# Patient Record
Sex: Female | Born: 1955
Health system: Southern US, Community
[De-identification: ages and names within clinical notes are randomized; demographics above are authoritative.]

## PROBLEM LIST (undated history)

## (undated) DIAGNOSIS — E079 Disorder of thyroid, unspecified: Secondary | ICD-10-CM

## (undated) DIAGNOSIS — Z87442 Personal history of urinary calculi: Secondary | ICD-10-CM

## (undated) DIAGNOSIS — F419 Anxiety disorder, unspecified: Secondary | ICD-10-CM

## (undated) DIAGNOSIS — I219 Acute myocardial infarction, unspecified: Secondary | ICD-10-CM

## (undated) DIAGNOSIS — F319 Bipolar disorder, unspecified: Secondary | ICD-10-CM

## (undated) DIAGNOSIS — E039 Hypothyroidism, unspecified: Secondary | ICD-10-CM

## (undated) DIAGNOSIS — G4730 Sleep apnea, unspecified: Secondary | ICD-10-CM

## (undated) DIAGNOSIS — I480 Paroxysmal atrial fibrillation: Secondary | ICD-10-CM

## (undated) DIAGNOSIS — Z8489 Family history of other specified conditions: Secondary | ICD-10-CM

## (undated) DIAGNOSIS — C3490 Malignant neoplasm of unspecified part of unspecified bronchus or lung: Secondary | ICD-10-CM

## (undated) DIAGNOSIS — R0600 Dyspnea, unspecified: Secondary | ICD-10-CM

## (undated) DIAGNOSIS — F4310 Post-traumatic stress disorder, unspecified: Secondary | ICD-10-CM

## (undated) DIAGNOSIS — I209 Angina pectoris, unspecified: Secondary | ICD-10-CM

## (undated) DIAGNOSIS — C801 Malignant (primary) neoplasm, unspecified: Secondary | ICD-10-CM

## (undated) DIAGNOSIS — J449 Chronic obstructive pulmonary disease, unspecified: Secondary | ICD-10-CM

## (undated) HISTORY — PX: EYE SURGERY: SHX253

## (undated) HISTORY — PX: BACK SURGERY: SHX140

## (undated) HISTORY — PX: ABDOMINAL HYSTERECTOMY: SHX81

## (undated) HISTORY — PX: OTHER SURGICAL HISTORY: SHX169

---

## 1998-04-25 ENCOUNTER — Emergency Department (HOSPITAL_COMMUNITY): Admission: EM | Admit: 1998-04-25 | Discharge: 1998-04-25 | Payer: Self-pay | Admitting: Emergency Medicine

## 1999-05-29 ENCOUNTER — Emergency Department (HOSPITAL_COMMUNITY): Admission: EM | Admit: 1999-05-29 | Discharge: 1999-05-29 | Payer: Self-pay | Admitting: Emergency Medicine

## 1999-05-29 ENCOUNTER — Encounter: Payer: Self-pay | Admitting: Emergency Medicine

## 1999-12-11 ENCOUNTER — Ambulatory Visit (HOSPITAL_BASED_OUTPATIENT_CLINIC_OR_DEPARTMENT_OTHER): Admission: RE | Admit: 1999-12-11 | Discharge: 1999-12-11 | Payer: Self-pay | Admitting: Orthopedic Surgery

## 2000-10-01 ENCOUNTER — Ambulatory Visit (HOSPITAL_BASED_OUTPATIENT_CLINIC_OR_DEPARTMENT_OTHER): Admission: RE | Admit: 2000-10-01 | Discharge: 2000-10-01 | Payer: Self-pay | Admitting: Orthopedic Surgery

## 2001-08-04 ENCOUNTER — Encounter: Payer: Self-pay | Admitting: Emergency Medicine

## 2001-08-04 ENCOUNTER — Emergency Department (HOSPITAL_COMMUNITY): Admission: EM | Admit: 2001-08-04 | Discharge: 2001-08-04 | Payer: Self-pay | Admitting: Emergency Medicine

## 2003-08-07 ENCOUNTER — Emergency Department (HOSPITAL_COMMUNITY): Admission: AD | Admit: 2003-08-07 | Discharge: 2003-08-07 | Payer: Self-pay | Admitting: Family Medicine

## 2004-01-28 ENCOUNTER — Emergency Department (HOSPITAL_COMMUNITY): Admission: EM | Admit: 2004-01-28 | Discharge: 2004-01-28 | Payer: Self-pay | Admitting: *Deleted

## 2004-02-27 ENCOUNTER — Encounter: Admission: RE | Admit: 2004-02-27 | Discharge: 2004-02-27 | Payer: Self-pay | Admitting: Sports Medicine

## 2004-02-29 ENCOUNTER — Encounter: Admission: RE | Admit: 2004-02-29 | Discharge: 2004-02-29 | Payer: Self-pay | Admitting: Sports Medicine

## 2004-05-15 ENCOUNTER — Ambulatory Visit (HOSPITAL_BASED_OUTPATIENT_CLINIC_OR_DEPARTMENT_OTHER): Admission: RE | Admit: 2004-05-15 | Discharge: 2004-05-15 | Payer: Self-pay | Admitting: Orthopaedic Surgery

## 2005-01-09 ENCOUNTER — Encounter: Admission: RE | Admit: 2005-01-09 | Discharge: 2005-01-09 | Payer: Self-pay | Admitting: Family Medicine

## 2006-11-11 ENCOUNTER — Encounter: Admission: RE | Admit: 2006-11-11 | Discharge: 2006-11-11 | Payer: Self-pay | Admitting: Neurology

## 2006-11-25 ENCOUNTER — Encounter: Admission: RE | Admit: 2006-11-25 | Discharge: 2006-11-25 | Payer: Self-pay | Admitting: Neurology

## 2007-05-23 ENCOUNTER — Emergency Department (HOSPITAL_COMMUNITY): Admission: EM | Admit: 2007-05-23 | Discharge: 2007-05-23 | Payer: Self-pay | Admitting: Emergency Medicine

## 2008-03-19 ENCOUNTER — Emergency Department (HOSPITAL_COMMUNITY): Admission: EM | Admit: 2008-03-19 | Discharge: 2008-03-19 | Payer: Self-pay | Admitting: Family Medicine

## 2008-11-08 ENCOUNTER — Ambulatory Visit (HOSPITAL_BASED_OUTPATIENT_CLINIC_OR_DEPARTMENT_OTHER): Admission: RE | Admit: 2008-11-08 | Discharge: 2008-11-08 | Payer: Self-pay | Admitting: Orthopaedic Surgery

## 2009-07-02 ENCOUNTER — Emergency Department (HOSPITAL_COMMUNITY): Admission: EM | Admit: 2009-07-02 | Discharge: 2009-07-02 | Payer: Self-pay | Admitting: Emergency Medicine

## 2009-08-08 ENCOUNTER — Ambulatory Visit (HOSPITAL_COMMUNITY): Admission: RE | Admit: 2009-08-08 | Discharge: 2009-08-09 | Payer: Self-pay | Admitting: Neurosurgery

## 2009-11-06 ENCOUNTER — Emergency Department (HOSPITAL_COMMUNITY): Admission: EM | Admit: 2009-11-06 | Discharge: 2009-11-06 | Payer: Self-pay | Admitting: Emergency Medicine

## 2010-11-28 LAB — LIPASE, BLOOD: Lipase: 22 U/L (ref 11–59)

## 2010-11-28 LAB — COMPREHENSIVE METABOLIC PANEL
ALT: 21 U/L (ref 0–35)
Albumin: 4.2 g/dL (ref 3.5–5.2)
Alkaline Phosphatase: 82 U/L (ref 39–117)
BUN: 7 mg/dL (ref 6–23)
CO2: 26 mEq/L (ref 19–32)
Chloride: 104 mEq/L (ref 96–112)
Total Bilirubin: 0.5 mg/dL (ref 0.3–1.2)

## 2010-11-28 LAB — CBC
HCT: 44.3 % (ref 36.0–46.0)
MCHC: 34 g/dL (ref 30.0–36.0)
MCV: 93.7 fL (ref 78.0–100.0)
Platelets: 314 10*3/uL (ref 150–400)
RBC: 4.73 MIL/uL (ref 3.87–5.11)
RDW: 12.8 % (ref 11.5–15.5)

## 2010-11-28 LAB — URINALYSIS, ROUTINE W REFLEX MICROSCOPIC
Bilirubin Urine: NEGATIVE
Glucose, UA: NEGATIVE mg/dL
Hgb urine dipstick: NEGATIVE
Ketones, ur: NEGATIVE mg/dL
Nitrite: NEGATIVE
Protein, ur: NEGATIVE mg/dL
Specific Gravity, Urine: 1.011 (ref 1.005–1.030)
Urobilinogen, UA: 0.2 mg/dL (ref 0.0–1.0)
pH: 6 (ref 5.0–8.0)

## 2010-11-28 LAB — DIFFERENTIAL
Eosinophils Relative: 1 % (ref 0–5)
Lymphs Abs: 3.7 10*3/uL (ref 0.7–4.0)

## 2010-12-12 LAB — COMPREHENSIVE METABOLIC PANEL
Albumin: 4.5 g/dL (ref 3.5–5.2)
CO2: 28 mEq/L (ref 19–32)
Creatinine, Ser: 0.62 mg/dL (ref 0.4–1.2)
GFR calc non Af Amer: >60 mL/min (ref >60)
Potassium: 3.9 mEq/L (ref 3.5–5.1)
Total Protein: 7.6 g/dL (ref 6.0–8.3)

## 2010-12-12 LAB — CBC
HCT: 43 % (ref 36.0–46.0)
MCV: 94.5 fL (ref 78.0–100.0)
Platelets: 214 10*3/uL (ref 150–400)
RBC: 4.55 MIL/uL (ref 3.87–5.11)

## 2011-01-22 NOTE — Op Note (Signed)
Tracy Park, Tracy Park              ACCOUNT NO.:  [id number]   MEDICAL RECORD NO.:  [id number]          PATIENT TYPE:  AMB   LOCATION:  DSC                          FACILITY:  [facility]   PHYSICIAN:  Lubertha Basque. Dalldorf, M.D.DATE OF BIRTH:  1956-03-21   DATE OF PROCEDURE:  11/08/2008  DATE OF DISCHARGE:                               OPERATIVE REPORT   PREOPERATIVE DIAGNOSIS:  Right shoulder biceps tendinopathy.   POSTOPERATIVE DIAGNOSIS:  Right shoulder biceps tendinopathy.   PROCEDURE:  1. Right shoulder arthroscopic debridement.  2. Right shoulder open biceps tenodesis.   ANESTHESIA:  General.   ATTENDING SURGEON:  Lubertha Basque. Jerl Santos, MD   ASSISTANT:  Lindwood Qua, PA   INDICATIONS FOR PROCEDURE:  The patient is a 55 year old woman many  years out from a shoulder injury.  She has had two arthroscopies of her  shoulder most recently 2 years back.  She persists with anterior pain in  the shoulder which has responded only to injections in the biceps tendon  sheath.  She has failed therapy and oral anti-inflammatories.  MRI scan  of the rotator cuff appears to be intact and the biceps itself also  appears relatively benign.  Unfortunately, she persists with her  discomfort and at this point is offered a third operation on her  shoulder.  She has been to a neck specialist who does not feel that her  right arm pain is related to a disk problem which she also has at the  cervical spine.  Informed operative consent was obtained after  discussion of possible complications including reaction to anesthesia  and infection and neurovascular injury.   SUMMARY OF FINDINGS AND PROCEDURE:  Under general anesthesia an  arthroscopy of the right shoulder was performed.  Glenohumeral joint  showed no degenerative changes and the rotator cuff did have some  partial-thickness tearing seen on this articular surface.  A debridement  was done but the tear did not consist of more than 10% of thickness  of  the tendon.  The labral structures all appeared intact.  The biceps  tendon appeared benign.  When I pulled this down into the shoulder,  there was a small longitudinal split with some erythema at the exit of  the shoulder.  We subsequently performed a biceps tenodesis in an open  fashion.  [name] assisted throughout and was invaluable to the  completion of the case that he helped retract and position while I  performed the procedure.   DESCRIPTION OF PROCEDURE:  The patient was taken to the operating suite  where general anesthetic was applied without difficulty.  She was  positioned in beach-chair position and prepped and draped in normal  sterile fashion.  After administration of IV Kefzol, an arthroscopy of  the right shoulder was performed through a total of two portals.  Findings were as noted above and procedure consisted of the arthroscopic  debridement of the undersurface rotator cuff tear followed by a biceps  tenodesis.  We cut the tendon at the superior labrum with a basket and  then smoothed this off with the ArthroCare wand.  I then made a small  anterolateral incision with dissection down through longitudinal split  in the deltoid to the bicipital groove.  We found the tendon here and  brought out into the wound.  I created a bleeding bed of bone with an  arthroscopic bur and then sutured the tendon in this area with Vicryl  suture.  The shoulder was irrigated followed by reapproximation of the  deltoid split with Vicryl and subcutaneous tissues with 2-0 undyed  Vicryl.  Skin was closed with nylon.  Marcaine was injected followed by  Adaptic and dry gauze dressing with tape.  The arthroscopic portals were  closed loosely with nylon.  These portals were dressed in a similar  fashion.  Estimated blood loss and intraoperative fluids obtained from  anesthesia records.   DISPOSITION:  The patient was extubated in the operating room and taken  to recovery room in  stable addition.  She was to go home the same day  and follow up in the office in less than a week.  I will contact her by  phone tonight.      Lubertha Basque Jerl Santos, M.D.  Electronically Signed     PGD/MEDQ  D:  11/08/2008  T:  11/09/2008  Job:  [id number]

## 2011-01-25 NOTE — Op Note (Signed)
[place]. [facility]  Patient:    Tracy Park, Tracy Park                        MRN: [id number] Proc. Date: 10/01/00 Attending:  Nicki Reaper, M.D.                           Operative Report  PREOPERATIVE DIAGNOSIS:  Stenosing tenosynovitis, left index finger.  POSTOPERATIVE DIAGNOSIS:  Stenosing tenosynovitis, left index finger.  OPERATION PERFORMED:  Release A-1 pulley left index finger.  SURGEON:  Nicki Reaper, M.D.  ASSISTANTCarolyne Fiscal, RN.  ANESTHESIA:  Forearm based IV regional.  ANESTHESIOLOGIST:  Dr.Singer.  INDICATIONS FOR PROCEDURE:  The patient is a 55 year old female with a history of triggering of her left index finger.  She has undergone reconstruction of the collateral ligaments and is seen now for release of the A1 pulley.  DESCRIPTION OF PROCEDURE:  The patient was brought to the operating room where a forearm based IV regional anesthetic was carried out without difficulty. She was prepped and draped using Betadine scrub and solution.  An oblique incision was made over the A-1 pulley of the left index finger and carried own through subcutaneous tissues.  Bleeders were electrocauterized.  A moderate amount of scar was present in the radial digital nerve.  A neurolysis was performed releasing this area.  The A-1 pulley was then released on its radial aspect.  A small incision made in the A-2 pulley.  Moderate scarring was present around the tendons.  The wound was copiously irrigated with saline and the finger placed through its range of motion and no further triggering was identified.  The skin was closed with interrupted 5-0 nylon sutures.  Sterile compressive dressing and splint was applied.  The patient tolerated the procedure well and was discharged home to return to the [facility] [place] in one week on Vicodin and Keflex. DD:  10/01/00 TD:  10/01/00 Job: [id number]

## 2011-01-25 NOTE — Op Note (Signed)
NAME:  Tracy Park, Tracy Park                        ACCOUNT NO.:  [id number]   MEDICAL RECORD NO.:  [id number]                   PATIENT TYPE:  AMB   LOCATION:  DSC                                  FACILITY:  [facility]   PHYSICIAN:  Lubertha Basque. Jerl Santos, M.D.             DATE OF BIRTH:  [birth year]   DATE OF PROCEDURE:  05/15/2004  DATE OF DISCHARGE:                                 OPERATIVE REPORT   PREOPERATIVE DIAGNOSES:  1.  Right shoulder impingement.  2.  Right shoulder AC degeneration.  3.  Right shoulder partial rotator cuff tear.   POSTOPERATIVE DIAGNOSES:  1.  Right shoulder impingement.  2.  Right shoulder AC degeneration.  3.  Right shoulder partial rotator cuff tear.   PROCEDURE:  1.  Right shoulder arthroscopic acromioplasty.  2.  Right shoulder arthroscopic acromioclavicular joint resection.  3.  Right shoulder arthroscopic debridement.   SURGEON:  Lubertha Basque. Jerl Santos, M.D.   ASSISTANT:  Lindwood Qua, P.A.   ANESTHESIA:  General.   INDICATIONS FOR PROCEDURE:  The patient is a 55 year old woman with a long  history of a work-related shoulder problem.  This has persisted despite oral  anti-inflammatories, therapy and an injection.  She has undergone an MRI  scan which showed findings consistent with impingement, due to her  subacromial morphology and her AC joint. She has pain at rest and pain with  activities, and is offered an arthroscopy.  Informed operative consent was  obtained after discussion of the possible complications of, reaction to  anesthesia and infection.   DESCRIPTION OF PROCEDURE:  The patient was taken to the operating suite  where general anesthesia was applied without difficulty.  She was positioned  in the beach chair position, prepped and draped in normal sterile fashion.  After the administration of preoperative IV antibiotics, arthroscopy of the  right shoulder was performed through a total of 3 portals.   Glenohumeral joint showed no  degenerative change.  The biceps tendon and  rotator cuff appeared completely benign from below.  In the subacromial  space, she had some bursitis addressed with a debridement. She had a partial  thickness rotator cuff tear addressed with a debridement. She had an  unfavorable, subacromial morphology addressed with an acromioplasty back to  a flat surface. This was done with the bur in the lateral position, followed  by transfer of the bur to the posterior position.  She also had bone on bone  contact at the [facility] joint and a spur below the distal clavicle, which also  appeared to impinge on the rotator cuff.  A formal ACD compression was then  done through the additional anterior portal.   The shoulder was thoroughly irrigated, followed by reapproximation of the  portals loosely with nylon.  Marcaine with epinephrine and morphine were  injected into the shoulder joint, followed by Adaptic with a dry gauze  dressing and tape.  Estimated blood loss  and intraoperative fluids can be  obtained from anesthesia records.   DISPOSITION:  The patient was extubated in the operating room, taken to the  recovery room stable.  Plans were for her to go home the same day and to  follow-up in the office in less than a week.  I will contact her by phone  tonight.                                               Lubertha Basque Jerl Santos, M.D.    PGD/MEDQ  D:  05/15/2004  T:  05/15/2004  Job:  [id number]

## 2011-01-25 NOTE — Op Note (Signed)
[place]. [facility]  Patient:    Tracy Park, Tracy Park                        MRN: [id number] Proc. Date: 12/11/99 Attending:  Nicki Reaper, M.D. CC:         Nicki Reaper, M.D. (2)                           Operative Report  PREOPERATIVE DIAGNOSIS:  Ruptured collateral ligament, metacarpal phalangeal joint, left index finger.  POSTOPERATIVE DIAGNOSIS:  Ruptured collateral ligament, metacarpal phalangeal joint, left index finger.  OPERATION:  Repair of radial and ulnar collateral ligaments, metacarpal phalangeal joint, left middle finger.  SURGEON:  Nicki Reaper, M.D.  ASSISTANT:  R.N.  ANESTHESIA:  Axillary block.  ANESTHESIOLOGIST:  Judie Petit, M.D.  INDICATIONS:  The patient is a 55 year old female with an injury to the left index finger.  She has a fracture fragment on the proximal phalanx of the index finger, radial side.  An MRI reveals a rupture of the ulnar collateral ligament off the  metacarpal head.  DESCRIPTION OF PROCEDURE:  The patient was brought to the operating room where n axillary block was carried out without difficulty.  She was prepped and draped using Betadine scrub and solution, with the left arm free, in the supine position. A curved incision was made over the metacarpal and carried down through the subcutaneous tissues.  The bleeders were electrocauterized.  The incision was made through the extensor retinaculum on the sagittal fibers on the ulnar side, allowing the extensor tendon to be pulled radially.  The joint capsule was opened with sharp dissection.  The collateral ligaments were then inspected.  A defect was present at the attachment of the ulnar collateral ligament off of the metacarpal head on the ulnar side.  Detachment was present on the radial side at the proximal phalanx.  Drill holes were placed for the placement of a suture through the metacarpal head, and through the base of the proximal  phalanx in the position of the old insertion of the collateral ligament.  The modified Kessler #3-0 Tycron sutures were passed through the drill holes, after using a Bunnell-type suture.  The joint was pinned with a 3.5 K-wire in 70 degrees of flexion, neutral position, and the repairs were performed to both radial and ulnar collateral ligaments.  The sutures were buried beneath the extensor tendon.  The wound was irrigated.  The capsule was closed ith a running #5-0 chromic suture.  The extensor retinaculum sagittal fibers closed  with running #4-0 Mersilene sutures.  The skin was closed with interrupted #5-0  nylon sutures.  A sterile compressive dressing and splint were applied.  The patient tolerated the procedure well and was taken to the recovery room for  observation, in satisfactory condition.  DISPOSITION:  She is discharged home, to return to the [facility] in one week, on Vicodin and Keflex. DD:  12/11/99 TD:  12/11/99 Job: [id number]

## 2011-05-16 ENCOUNTER — Other Ambulatory Visit: Payer: Self-pay | Admitting: Obstetrics and Gynecology

## 2011-05-16 DIAGNOSIS — Z1231 Encounter for screening mammogram for malignant neoplasm of breast: Secondary | ICD-10-CM

## 2012-04-27 ENCOUNTER — Encounter (HOSPITAL_COMMUNITY): Payer: Self-pay | Admitting: *Deleted

## 2012-04-27 ENCOUNTER — Emergency Department (HOSPITAL_COMMUNITY): Payer: Self-pay

## 2012-04-27 ENCOUNTER — Observation Stay (HOSPITAL_COMMUNITY)
Admission: EM | Admit: 2012-04-27 | Discharge: 2012-04-28 | Disposition: A | Discharge status: Home / Self Care | Payer: Self-pay | Attending: Internal Medicine | Admitting: Internal Medicine

## 2012-04-27 DIAGNOSIS — F3289 Other specified depressive episodes: Secondary | ICD-10-CM | POA: Insufficient documentation

## 2012-04-27 DIAGNOSIS — R079 Chest pain, unspecified: Secondary | ICD-10-CM

## 2012-04-27 DIAGNOSIS — Z8249 Family history of ischemic heart disease and other diseases of the circulatory system: Secondary | ICD-10-CM | POA: Insufficient documentation

## 2012-04-27 DIAGNOSIS — E890 Postprocedural hypothyroidism: Secondary | ICD-10-CM | POA: Insufficient documentation

## 2012-04-27 DIAGNOSIS — K219 Gastro-esophageal reflux disease without esophagitis: Secondary | ICD-10-CM

## 2012-04-27 DIAGNOSIS — Z720 Tobacco use: Secondary | ICD-10-CM

## 2012-04-27 DIAGNOSIS — F17200 Nicotine dependence, unspecified, uncomplicated: Secondary | ICD-10-CM | POA: Insufficient documentation

## 2012-04-27 DIAGNOSIS — F329 Major depressive disorder, single episode, unspecified: Secondary | ICD-10-CM | POA: Diagnosis present

## 2012-04-27 DIAGNOSIS — I099 Rheumatic heart disease, unspecified: Secondary | ICD-10-CM | POA: Insufficient documentation

## 2012-04-27 DIAGNOSIS — R0789 Other chest pain: Principal | ICD-10-CM

## 2012-04-27 DIAGNOSIS — F32A Depression, unspecified: Secondary | ICD-10-CM

## 2012-04-27 DIAGNOSIS — E039 Hypothyroidism, unspecified: Secondary | ICD-10-CM

## 2012-04-27 HISTORY — DX: Disorder of thyroid, unspecified: E07.9

## 2012-04-27 MED ORDER — ONDANSETRON HCL 4 MG/2ML IJ SOLN
4.0000 mg | Freq: Once | INTRAMUSCULAR | Status: AC
  Administered 2012-04-27: 4 mg via INTRAVENOUS
  Filled 2012-04-27: qty 2

## 2012-04-27 MED ORDER — MORPHINE SULFATE 4 MG/ML IJ SOLN
4.0000 mg | Freq: Once | INTRAMUSCULAR | Status: AC
  Administered 2012-04-27: 4 mg via INTRAVENOUS
  Filled 2012-04-27: qty 1

## 2012-04-27 NOTE — ED Notes (Signed)
Pt in via EMS- per EMS, pt in c/o chest pain intermittent chest pain since thyroidectomy 7/17, states pain has been controllable with rest since that time, this evening pain started and was more severe, rated pain 10/10 to central chest with no radiation, pt denies other symptoms with this. Pt currently rating pain 4/10. Pt was given nitro SL x2 PTA, pt is allergic to ASA. IV initiated PTA 20g in LAC.

## 2012-04-27 NOTE — ED Provider Notes (Signed)
History     CSN: [id number]  Arrival date & time 04/27/12  2322   First MD Initiated Contact with Patient 04/27/12 2330      Chief Complaint  Patient presents with  . Chest Pain    (Consider location/radiation/quality/duration/timing/severity/associated sxs/prior treatment) HPI Hx per PT. At home tonight, at rest, developed substernal chest pressure that radiated to R side around to her back. No SOB, nausea or diaphoresis. Has h/o reflux but never symptoms like this. No leg pain or swelling, did have thyroid removed 03-25-12, followed by PCP in [facility] [address] [place]. Dec pain with NTG in route still has pain. 4/10. No h/o same. No h/o CAD   No past medical history on file.  No past surgical history on file.  No family history on file.  History  Substance Use Topics  . Smoking status: Not on file  . Smokeless tobacco: Not on file  . Alcohol Use: Not on file    OB History    No data available      Review of Systems  Constitutional: Negative for fever and chills.  HENT: Negative for neck pain and neck stiffness.   Eyes: Negative for pain.  Respiratory: Negative for shortness of breath.   Cardiovascular: Positive for chest pain.  Gastrointestinal: Negative for abdominal pain.  Genitourinary: Negative for dysuria.  Musculoskeletal: Negative for back pain.  Skin: Negative for rash.  Neurological: Negative for headaches.  All other systems reviewed and are negative.    Allergies  Review of patient's allergies indicates not on file.  Home Medications  No current outpatient prescriptions on file.  There were no vitals taken for this visit.  Physical Exam  Constitutional: She is oriented to person, place, and time. She appears well-developed and well-nourished.  HENT:  Head: Normocephalic and atraumatic.  Eyes: Conjunctivae and EOM are normal. Pupils are equal, round, and reactive to light.  Neck: Trachea normal. Neck supple. No tracheal deviation present.   Healing anterior thyroidectomy scar  Cardiovascular: Normal rate, regular rhythm, S1 normal, S2 normal and normal pulses.     No systolic murmur is present   No diastolic murmur is present  Pulses:      Radial pulses are 2+ on the right side, and 2+ on the left side.  Pulmonary/Chest: Effort normal and breath sounds normal. No stridor. She has no wheezes. She has no rhonchi. She has no rales. She exhibits no tenderness.  Abdominal: Soft. Normal appearance and bowel sounds are normal. There is no tenderness. There is no CVA tenderness and negative Murphy's sign.  Musculoskeletal:       BLE:s Calves nontender, no cords or erythema, negative Homans sign  Neurological: She is alert and oriented to person, place, and time. She has normal strength. No cranial nerve deficit or sensory deficit. GCS eye subscore is 4. GCS verbal subscore is 5. GCS motor subscore is 6.  Skin: Skin is warm and dry. No rash noted. She is not diaphoretic.  Psychiatric: Her speech is normal.       Cooperative and appropriate    ED Course  Procedures (including critical care time)  Results for orders placed during the hospital encounter of 04/27/12  CBC      Component Value Range   WBC 12.7 (*) 4.0 - 10.5 K/uL   RBC 4.48  3.87 - 5.11 MIL/uL   Hemoglobin 13.9  12.0 - 15.0 g/dL   HCT 08.6  57.8 - 46.9 %   MCV 92.2  78.0 -  100.0 fL   MCH 31.0  26.0 - 34.0 pg   MCHC 33.7  30.0 - 36.0 g/dL   RDW 16.1  09.6 - 04.5 %   Platelets 218  150 - 400 K/uL  D-DIMER, QUANTITATIVE      Component Value Range   D-Dimer, Quant 0.83 (*) 0.00 - 0.48 ug/mL-FEU  POCT I-STAT, CHEM 8      Component Value Range   Sodium 138  135 - 145 mEq/L   Potassium 3.9  3.5 - 5.1 mEq/L   Chloride 104  96 - 112 mEq/L   BUN 14  6 - 23 mg/dL   Creatinine, Ser 4.09  0.50 - 1.10 mg/dL   Glucose, Bld 811 (*) 70 - 99 mg/dL   Calcium, Ion 9.14  7.82 - 1.23 mmol/L   TCO2 24  0 - 100 mmol/L   Hemoglobin 14.6  12.0 - 15.0 g/dL   HCT 95.6  21.3 - 08.6 %   POCT I-STAT TROPONIN I      Component Value Range   Troponin i, poc 0.00  0.00 - 0.08 ng/mL   Comment 3            Ct Angio Chest Pe W/cm &/or Wo Cm  04/28/2012  *RADIOLOGY REPORT*  Clinical Data: Intermittent chest pain since thyroidectomy 07/17.  CT ANGIOGRAPHY CHEST  Technique:  Multidetector CT imaging of the chest using the standard protocol during bolus administration of intravenous contrast. Multiplanar reconstructed images including MIPs were obtained and reviewed to evaluate the vascular anatomy.  Contrast: OMNIPAQUE IOHEXOL 350 MG/ML SOLN  Comparison: None.  Findings: Technically adequate study with good opacification of the central and segmental pulmonary arteries.  No focal filling defects demonstrated.  No evidence of significant pulmonary embolus.  Normal heart size.  Normal caliber thoracic aorta.  No aortic dissection.  Coronary artery calcifications.  No significant lymphadenopathy in the chest.  The esophagus is decompressed.  No pleural effusions.  Emphysematous changes and scattered fibrosis in the lungs.  No focal airspace consolidation.  No significant interstitial disease.  Airways appear patent.  No pneumothorax.  IMPRESSION: No evidence of significant pulmonary embolus.  Emphysematous changes and fibrosis in the lungs.   Original Report Authenticated By: Marlon Pel, M.D.    Dg Chest Portable 1 View  04/28/2012  *RADIOLOGY REPORT*  Clinical Data: Chest pain, short of breath  PORTABLE CHEST - 1 VIEW  Comparison: 01/09/2005  Findings: Heart size and vascularity are normal.  Lungs are clear without infiltrate or effusion.  Negative for heart failure.  IMPRESSION: No acute abnormality.   Original Report Authenticated By: Camelia Phenes, M.D.      Date: 04/27/2012  Rate: 83  Rhythm: normal sinus rhythm  QRS Axis: normal  Intervals: normal  ST/T Wave abnormalities: nonspecific ST changes  Conduction Disutrbances:none  Narrative Interpretation:   Old EKG Reviewed:  unchanged  ASA and NTG PTA.   IV morphine.   7:11 AM d/w triad hospitalist on call Dr Isidoro Donning, will admit tele team 9 MDM   CP evaluated with labs, ECG and CXR as above. IV narcotics. Plan MED admit.         Sunnie Nielsen, MD 04/28/12 [phone]

## 2012-04-28 ENCOUNTER — Encounter (HOSPITAL_COMMUNITY): Payer: Self-pay | Admitting: Radiology

## 2012-04-28 ENCOUNTER — Observation Stay (HOSPITAL_COMMUNITY): Payer: Self-pay

## 2012-04-28 ENCOUNTER — Emergency Department (HOSPITAL_COMMUNITY): Payer: Self-pay

## 2012-04-28 DIAGNOSIS — K219 Gastro-esophageal reflux disease without esophagitis: Secondary | ICD-10-CM | POA: Diagnosis present

## 2012-04-28 DIAGNOSIS — F17200 Nicotine dependence, unspecified, uncomplicated: Secondary | ICD-10-CM

## 2012-04-28 DIAGNOSIS — Z720 Tobacco use: Secondary | ICD-10-CM | POA: Diagnosis present

## 2012-04-28 DIAGNOSIS — E039 Hypothyroidism, unspecified: Secondary | ICD-10-CM | POA: Diagnosis present

## 2012-04-28 DIAGNOSIS — R0789 Other chest pain: Secondary | ICD-10-CM | POA: Diagnosis present

## 2012-04-28 DIAGNOSIS — F32A Depression, unspecified: Secondary | ICD-10-CM | POA: Diagnosis present

## 2012-04-28 DIAGNOSIS — F329 Major depressive disorder, single episode, unspecified: Secondary | ICD-10-CM

## 2012-04-28 DIAGNOSIS — R079 Chest pain, unspecified: Secondary | ICD-10-CM

## 2012-04-28 LAB — CBC
HCT: 41.2 % (ref 36.0–46.0)
HCT: 41.3 % (ref 36.0–46.0)
HCT: 41.3 % (ref 36.0–46.0)
Hemoglobin: 13.9 g/dL (ref 12.0–15.0)
Hemoglobin: 13.6 g/dL (ref 12.0–15.0)
MCH: 30.6 pg (ref 26.0–34.0)
MCH: 31 pg (ref 26.0–34.0)
MCH: 31 pg (ref 26.0–34.0)
MCHC: 33 g/dL (ref 30.0–36.0)
MCHC: 33.7 g/dL (ref 30.0–36.0)
MCHC: 33.7 g/dL (ref 30.0–36.0)
MCV: 92.2 fL (ref 78.0–100.0)
Platelets: 218 10*3/uL (ref 150–400)
RBC: 4.45 MIL/uL (ref 3.87–5.11)
RDW: 13.5 % (ref 11.5–15.5)
WBC: 12.7 10*3/uL — ABNORMAL HIGH (ref 4.0–10.5)

## 2012-04-28 LAB — CREATININE, SERUM
Creatinine, Ser: 0.66 mg/dL (ref 0.50–1.10)
GFR calc non Af Amer: >90 mL/min (ref >90)
GFR calc non Af Amer: >90 mL/min (ref >90)

## 2012-04-28 LAB — POCT I-STAT, CHEM 8
Chloride: 104 mEq/L (ref 96–112)
Hemoglobin: 14.6 g/dL (ref 12.0–15.0)
Sodium: 138 mEq/L (ref 135–145)
TCO2: 24 mmol/L (ref 0–100)

## 2012-04-28 LAB — CARDIAC PANEL(CRET KIN+CKTOT+MB+TROPI): Troponin I: <0.3 ng/mL (ref <0.30)

## 2012-04-28 LAB — POCT I-STAT TROPONIN I

## 2012-04-28 LAB — TSH: TSH: 51.704 u[IU]/mL — ABNORMAL HIGH (ref 0.350–4.500)

## 2012-04-28 MED ORDER — REGADENOSON 0.4 MG/5ML IV SOLN
0.4000 mg | Freq: Once | INTRAVENOUS | Status: AC
  Administered 2012-04-28: 0.4 mg via INTRAVENOUS
  Filled 2012-04-28: qty 5

## 2012-04-28 MED ORDER — ALUM & MAG HYDROXIDE-SIMETH 200-200-20 MG/5ML PO SUSP
30.0000 mL | Freq: Four times a day (QID) | ORAL | Status: DC | PRN
Start: 2012-04-28 — End: 2012-04-28

## 2012-04-28 MED ORDER — ACETAMINOPHEN 325 MG PO TABS
ORAL_TABLET | ORAL | Status: AC
  Filled 2012-04-28: qty 2

## 2012-04-28 MED ORDER — TECHNETIUM TC 99M TETROFOSMIN IV KIT
10.0000 | PACK | Freq: Once | INTRAVENOUS | Status: AC | PRN
  Administered 2012-04-28: 10 via INTRAVENOUS

## 2012-04-28 MED ORDER — ONDANSETRON HCL 4 MG PO TABS: 4.0000 mg | ORAL_TABLET | Freq: Four times a day (QID) | ORAL | Status: DC | PRN

## 2012-04-28 MED ORDER — ENOXAPARIN SODIUM 40 MG/0.4ML ~~LOC~~ SOLN
40.0000 mg | SUBCUTANEOUS | Status: DC
  Filled 2012-04-28: qty 0.4

## 2012-04-28 MED ORDER — HYDROMORPHONE HCL PF 1 MG/ML IJ SOLN: 1.0000 mg | INTRAMUSCULAR | Status: DC | PRN

## 2012-04-28 MED ORDER — SERTRALINE HCL 100 MG PO TABS
100.0000 mg | ORAL_TABLET | Freq: Every day | ORAL | Status: DC
  Administered 2012-04-28: 100 mg via ORAL
  Filled 2012-04-28: qty 1

## 2012-04-28 MED ORDER — NICOTINE 21 MG/24HR TD PT24
21.0000 mg | MEDICATED_PATCH | Freq: Every day | TRANSDERMAL | Status: DC
  Administered 2012-04-28: 21 mg via TRANSDERMAL
  Filled 2012-04-28: qty 1

## 2012-04-28 MED ORDER — ONDANSETRON HCL 4 MG/2ML IJ SOLN: 4.0000 mg | Freq: Four times a day (QID) | INTRAMUSCULAR | Status: DC | PRN

## 2012-04-28 MED ORDER — ACETAMINOPHEN 650 MG RE SUPP: 650.0000 mg | Freq: Four times a day (QID) | RECTAL | Status: DC | PRN

## 2012-04-28 MED ORDER — ALBUTEROL SULFATE (5 MG/ML) 0.5% IN NEBU
2.5000 mg | INHALATION_SOLUTION | Freq: Four times a day (QID) | RESPIRATORY_TRACT | Status: DC
  Administered 2012-04-28: 2.5 mg via RESPIRATORY_TRACT
  Filled 2012-04-28: qty 0.5

## 2012-04-28 MED ORDER — IOHEXOL 350 MG/ML SOLN
100.0000 mL | Freq: Once | INTRAVENOUS | Status: AC | PRN
  Administered 2012-04-28: 100 mL via INTRAVENOUS

## 2012-04-28 MED ORDER — PANTOPRAZOLE SODIUM 40 MG PO TBEC
80.0000 mg | DELAYED_RELEASE_TABLET | Freq: Every day | ORAL | Status: DC
  Administered 2012-04-28: 80 mg via ORAL
  Filled 2012-04-28 (×2): qty 1

## 2012-04-28 MED ORDER — REGADENOSON 0.4 MG/5ML IV SOLN
INTRAVENOUS | Status: AC
  Administered 2012-04-28: 0.4 mg via INTRAVENOUS
  Filled 2012-04-28: qty 5

## 2012-04-28 MED ORDER — HYDROCODONE-ACETAMINOPHEN 5-325 MG PO TABS
1.0000 | ORAL_TABLET | ORAL | Status: DC | PRN
  Administered 2012-04-28: 1 via ORAL
  Filled 2012-04-28: qty 1

## 2012-04-28 MED ORDER — SODIUM CHLORIDE 0.9 % IJ SOLN: 3.0000 mL | Freq: Two times a day (BID) | INTRAMUSCULAR | Status: DC

## 2012-04-28 MED ORDER — TECHNETIUM TC 99M TETROFOSMIN IV KIT
30.0000 | PACK | Freq: Once | INTRAVENOUS | Status: AC | PRN
  Administered 2012-04-28: 30 via INTRAVENOUS

## 2012-04-28 MED ORDER — LEVOTHYROXINE SODIUM 112 MCG PO TABS
112.0000 ug | ORAL_TABLET | Freq: Every day | ORAL | Status: DC
  Administered 2012-04-28: 112 ug via ORAL
  Filled 2012-04-28: qty 1

## 2012-04-28 MED ORDER — HEPARIN SODIUM (PORCINE) 5000 UNIT/ML IJ SOLN
5000.0000 [IU] | Freq: Three times a day (TID) | INTRAMUSCULAR | Status: DC
  Administered 2012-04-28: 5000 [IU] via SUBCUTANEOUS
  Filled 2012-04-28 (×4): qty 1

## 2012-04-28 MED ORDER — ALBUTEROL SULFATE (5 MG/ML) 0.5% IN NEBU: 2.5000 mg | INHALATION_SOLUTION | RESPIRATORY_TRACT | Status: DC | PRN

## 2012-04-28 MED ORDER — ACETAMINOPHEN 325 MG PO TABS
650.0000 mg | ORAL_TABLET | Freq: Four times a day (QID) | ORAL | Status: DC | PRN
  Administered 2012-04-28: 650 mg via ORAL

## 2012-04-28 MED ORDER — SODIUM CHLORIDE 0.9 % IV SOLN: INTRAVENOUS | Status: DC

## 2012-04-28 MED ORDER — GUAIFENESIN-DM 100-10 MG/5ML PO SYRP: 5.0000 mL | ORAL_SOLUTION | ORAL | Status: DC | PRN

## 2012-04-28 NOTE — ED Notes (Signed)
Pt to CT scan at this time.

## 2012-04-28 NOTE — Progress Notes (Signed)
UR Completed Tinzlee Craker Graves-Bigelow, RN,BSN [phone]  

## 2012-04-28 NOTE — ED Notes (Signed)
Called report to Christy, RN

## 2012-04-28 NOTE — Progress Notes (Addendum)
Pre-test, no chest pain or SOB.  Lexiscan MV performed without complication.  Tracy Park 04/28/2012 11:28 AM

## 2012-04-28 NOTE — ED Notes (Signed)
Patient comfortable.  Moving to 3W25 with monitor by EMT and RN at side.

## 2012-04-28 NOTE — Progress Notes (Signed)
Notified Dr. Otis Peak negative per report. May d/c patient.

## 2012-04-28 NOTE — ED Notes (Signed)
Pt returned from ct

## 2012-04-28 NOTE — H&P (Signed)
History and Physical       Hospital Admission Note Date: 04/28/2012  Patient name: Tracy Park Medical record number: [id number] Date of birth: 19-Dec-1955 Age: 56 y.o. Gender: female PCP: Dr Sw[place] in [place]    Chief Complaint:  Chest pain  HPI: Patient is a 56 year old female with history of GERD, recent thyroidectomy, family history of coronary artery disease and rheumatic fever/rheumatic heart disease presented to ED for chest pain. Patient stated that she's been having off-and-on chest pain for last one month which spontaneously resolves and no particular association with exertion or rest. However yesterday around 8:30 PM, patient was sitting on her couch and she started having midsternal chest pain. She initially thought it was "acid reflux" but the pain persisted and was midsternal, 10/10 in intensity, radiating to the right side of back and occasionally to the neck. She denied any nausea, vomiting, diaphoresis, palpitations. Patient called the EMS and chest pain resolved after 2 sublingual nitroglycerin. Point of care troponin was negative x2, D. dimer was slightly elevated at 0.83, CTA chest is negative for PE. Given patient has strong family history of cardiac disease, active nicotine abuse, patient will be admitted for chest pain rule out.   Review of Systems:  Constitutional: Denies fever, chills, diaphoresis, appetite change and fatigue.  HEENT: Denies photophobia, eye pain, redness, hearing loss, ear pain, congestion, sore throat, rhinorrhea, sneezing, mouth sores, trouble swallowing, neck pain, neck stiffness and tinnitus.   Respiratory: Denies SOB, DOE. Patient has some slight expiratory wheezing and coughing.  Cardiovascular: Please see history of present illness  Gastrointestinal: Denies nausea, vomiting, abdominal pain, diarrhea, constipation, blood in stool and abdominal distention.  Genitourinary: Denies dysuria,  urgency, frequency, hematuria, flank pain and difficulty urinating.  Musculoskeletal: Denies myalgias, back pain, joint swelling, arthralgias and gait problem.  Skin: Denies pallor, rash and wound.  Neurological: Denies dizziness, seizures, syncope, weakness, light-headedness, numbness and headaches.  Hematological: Denies adenopathy. Easy bruising, personal or family bleeding history  Psychiatric/Behavioral: Denies suicidal ideation, mood changes, confusion, nervousness, sleep disturbance and agitation  Past Medical History: Past Medical History  Diagnosis Date  . Thyroid disease (hypothyroidism acquired status post thyroidectomy) GERD  Depression  Nicotine abuse       Past surgical history. - Recent total thyroidectomy on 03/25/2012 (per patient it was due to nodules and scar tissue but biopsy was negative for any malignancy). - C5-C6 disc surgery - Total hysterectomy with appendectomy  Medications: Prior to Admission medications   Medication Sig Start Date End Date Taking? Authorizing Provider  levothyroxine (SYNTHROID, LEVOTHROID) 112 MCG tablet Take 112 mcg by mouth daily.   Yes Historical Provider, MD  omeprazole (PRILOSEC) 20 MG capsule Take 40 mg by mouth daily.   Yes Historical Provider, MD  sertraline (ZOLOFT) 100 MG tablet Take 100 mg by mouth daily.   Yes Historical Provider, MD    Allergies:   Allergies  Allergen Reactions  . Aspirin Hives  . Tape Rash    Social History: Patient smokes half a pack a day for last 40 years, she is trying to quit. Denies any alcohol or any drug use. She currently lives at home and placed without any assistance.  Family History: Patient has a strong family history of rheumatic fever and rheumatic heart disease and in her mother, maternal grandmother, 5 of her siblings. Mother died of MI at the age of 48.  Physical Exam: Blood pressure 109/69, pulse 69, temperature 97.9 F (36.6 C), temperature source Oral, resp. rate 17,  SpO2  95.00%. General: Alert, awake, oriented x3, in no acute distress. HEENT: normocephalic, atraumatic, anicteric sclera, pink conjunctiva, pupils equal and reactive to light and accomodation, oropharynx clear Neck: supple, no masses or lymphadenopathy, no goiter, no bruits  Heart: Regular rate and rhythm, without murmurs, rubs or gallops. Lungs: Scattered expiratory wheezing. No chest wall tenderness noted Abdomen: Soft, nontender, nondistended, positive bowel sounds, no masses. Extremities: No clubbing, cyanosis or edema with positive pedal pulses. Neuro: Grossly intact, no focal neurological deficits, strength 5/5 upper and lower extremities bilaterally Psych: alert and oriented x 3, normal mood and affect Skin: no rashes or lesions, warm and dry   LABS on Admission:  Basic Metabolic Panel:  Lab 04/28/12 1610  NA 138  K 3.9  CL 104  CO2 --  GLUCOSE 117*  BUN 14  CREATININE 1.00  CALCIUM --  MG --  PHOS --   CBC:  Lab 04/28/12 0733 04/28/12 0026 04/27/12 2349  WBC 9.8 -- 12.7*  NEUTROABS -- -- --  HGB 13.9 14.6 --  HCT 41.3 43.0 --  MCV 92.2 -- --  PLT 206 -- 218     Radiological Exams on Admission: Ct Angio Chest Pe W/cm &/or Wo Cm  04/28/2012  *RADIOLOGY REPORT*  Clinical Data: Intermittent chest pain since thyroidectomy 07/17.  CT ANGIOGRAPHY CHEST  Technique:  Multidetector CT imaging of the chest using the standard protocol during bolus administration of intravenous contrast. Multiplanar reconstructed images including MIPs were obtained and reviewed to evaluate the vascular anatomy.  Contrast: OMNIPAQUE IOHEXOL 350 MG/ML SOLN  Comparison: None.  Findings: Technically adequate study with good opacification of the central and segmental pulmonary arteries.  No focal filling defects demonstrated.  No evidence of significant pulmonary embolus.  Normal heart size.  Normal caliber thoracic aorta.  No aortic dissection.  Coronary artery calcifications.  No significant  lymphadenopathy in the chest.  The esophagus is decompressed.  No pleural effusions.  Emphysematous changes and scattered fibrosis in the lungs.  No focal airspace consolidation.  No significant interstitial disease.  Airways appear patent.  No pneumothorax.  IMPRESSION: No evidence of significant pulmonary embolus.  Emphysematous changes and fibrosis in the lungs.   Original Report Authenticated By: Marlon Pel, M.D.    Dg Chest Portable 1 View  04/28/2012  *RADIOLOGY REPORT*  Clinical Data: Chest pain, short of breath  PORTABLE CHEST - 1 VIEW  Comparison: 01/09/2005  Findings: Heart size and vascularity are normal.  Lungs are clear without infiltrate or effusion.  Negative for heart failure.  IMPRESSION: No acute abnormality.   Original Report Authenticated By: Camelia Phenes, M.D.     Assessment/Plan  Principal problem : .Chest pain, atypical: Chest pain currently resolved, she has a history of GERD however she also has a very strong family history of rheumatic heart disease, mother had premature coronary disease and died of MI at age of 13.  - Admit under observation, obtain serial cardiac enzymes, EKG does not show any acute ST-T wave changes suggestive of  Ischemia. Patient has no prior cardiac workup in the past. CTA chest is negative for any PE or aortic dissection. - No aspirin due to allergy. Discussed in detail with [facility], Dr. Shirlee Latch recommended to obtain lexiscan myoview stress test. Place on NPO status.  Marland KitchenGERD (gastroesophageal reflux disease): Continue PPI   .Hypothyroidism (acquired) - Continue Synthroid.    .Nicotine abuse: Patient was strongly counseled for smoking cessation, place on nicotine patch.   .Depression: continue  zoloft  DVT prophylaxis: lovenox  CODE STATUS: full code  Further plan will depend as patient's clinical course evolves and further radiologic and laboratory data become available.   Time Spent on Admission: 1 hour  Odelia Graciano  M.D. [facility] 04/28/2012, 7:57 AM Pager: [phone]  If 7PM-7AM, please contact night-coverage [email or web address] Password [email or web address]

## 2012-04-28 NOTE — ED Notes (Signed)
Pt is waiting on MD to review results. And set dispo.

## 2012-04-28 NOTE — ED Notes (Signed)
Internal medicine at bedside for evaluation. 

## 2012-04-28 NOTE — Care Management Note (Unsigned)
    Page 1 of 1   04/28/2012     11:30:02 AM   CARE MANAGEMENT NOTE 04/28/2012  Patient:  Tracy Park, Tracy Park   Account Number:  [id number]  Date Initiated:  04/28/2012  Documentation initiated by:  GRAVES-BIGELOW,Linzy Laury  Subjective/Objective Assessment:   Pt admitted with cp.     Action/Plan:   CM will monitor for disposition needs.   Anticipated DC Date:  04/29/2012   Anticipated DC Plan:  HOME/SELF CARE      DC Planning Services  CM consult      Choice offered to / List presented to:             Status of service:  In process, will continue to follow Medicare Important Message given?   (If response is "NO", the following Medicare IM given date fields will be blank) Date Medicare IM given:   Date Additional Medicare IM given:    Discharge Disposition:    Per UR Regulation:  Reviewed for med. necessity/level of care/duration of stay  If discussed at Long Length of Stay Meetings, dates discussed:    Comments:

## 2012-04-28 NOTE — ED Notes (Signed)
Pt awaiting admission eval.

## 2012-04-28 NOTE — Discharge Summary (Signed)
Physician Discharge Summary  Patient ID: LALITA EBEL MRN: [id number] DOB/AGE: 17-Apr-1956 56 y.o.  Admit date: 04/27/2012 Discharge date: 04/28/2012  Primary Care Physician:  Provider Not In System  Discharge Diagnoses:    .GERD (gastroesophageal reflux disease) .Hypothyroidism (acquired) .Nicotine abuse .Depression .Chest pain, atypical: Resolved, stress test negative for any reversible ischemia  Consults:  [facility] for stress test  Discharge Medications: Medication List  As of 04/28/2012  3:50 PM   TAKE these medications         levothyroxine 112 MCG tablet   Commonly known as: SYNTHROID, LEVOTHROID   Take 112 mcg by mouth daily.      omeprazole 20 MG capsule   Commonly known as: PRILOSEC   Take 40 mg by mouth daily.      sertraline 100 MG tablet   Commonly known as: ZOLOFT   Take 100 mg by mouth daily.             Brief H and P: For complete details please refer to admission H and P, but in brief, Patient is a 56 year old female with history of GERD, recent thyroidectomy, family history of coronary artery disease and rheumatic fever/rheumatic heart disease presented to ED for chest pain. Patient stated that she's been having off-and-on chest pain for last one month which spontaneously resolves and no particular association with exertion or rest. However yesterday around 8:30 PM, patient was sitting on her couch and she started having midsternal chest pain. She initially thought it was "acid reflux" but the pain persisted and was midsternal, 10/10 in intensity, radiating to the right side of back and occasionally to the neck. She denied any nausea, vomiting, diaphoresis, palpitations. Patient called the EMS and chest pain resolved after 2 sublingual nitroglycerin. Point of care troponin was negative x2, D. dimer was slightly elevated at 0.83, CTA chest is negative for PE. Given patient has strong family history of cardiac disease, active nicotine abuse, patient  was admitted for chest pain rule out and risk stratification.   Hospital Course:    *Chest pain, atypical: Resolved, 2 sets of point of care and full set of cardiac enzymes were negative for acute ACS. EKG did not show any ST segment elevation or depression. D-dimer was slightly elevated at 0.83 hence CT angiogram of the chest was obtained in ED which was negative for pulmonary embolism. As patient had strong family history of rheumatic heart disease and premature coronary disease in her mother, patient was admitted for risk stratification and she never had a prior cardiac workup. Patient underwent Lexiscan stress myoview test which showed normal myocardial perfusion study, EF 80%. Patient was recommended to follow up with her primary care physician in 10 days. She does have a history of acid reflux and was recommended to continue PPI. She was strongly counseled to quit smoking.    Day of Discharge BP 114/71  Pulse 70  Temp 98.3 F (36.8 C) (Oral)  Resp 19  SpO2 93%  Physical Exam: General: Alert and awake oriented x3 not in any acute distress. HEENT: anicteric sclera, pupils reactive to light and accommodation CVS: S1-S2 clear no murmur rubs or gallops Chest: clear to auscultation bilaterally, no wheezing rales or rhonchi Abdomen: soft nontender, nondistended, normal bowel sounds, no organomegaly Extremities: no cyanosis, clubbing or edema noted bilaterally Neuro: Cranial nerves II-XII intact, no focal neurological deficits   The results of significant diagnostics from this hospitalization (including imaging, microbiology, ancillary and laboratory) are listed below for reference.  LAB RESULTS: Basic Metabolic Panel:  Lab 04/28/12 1610 04/28/12 0733 04/28/12 0026  NA -- -- 138  K -- -- 3.9  CL -- -- 104  CO2 -- -- --  GLUCOSE -- -- 117*  BUN -- -- 14  CREATININE 0.66 0.67 --  CALCIUM -- -- --  MG -- -- --  PHOS -- -- --    CBC:  Lab 04/28/12 0950 04/28/12 0733  WBC  10.0 9.8  NEUTROABS -- --  HGB 13.6 13.9  HCT 41.2 41.3  MCV 92.6 --  PLT 226 206   Cardiac Enzymes:  Lab 04/28/12 0950  CKTOTAL 30  CKMB 1.8  CKMBINDEX --  TROPONINI <0.30    Significant Diagnostic Studies:  No results found.   Disposition and Follow-up: Discharge Orders    Future Orders Please Complete By Expires   Diet - low sodium heart healthy      Increase activity slowly          DISPOSITION: Home DIET: Heart healthy diet ACTIVITY: As tolerated   DISCHARGE FOLLOW-UP Follow-up Information    Follow up with Dr Sw[place]. (please follow-up with your PCP in 10 days )          Time spent on Discharge: 35 minutes  Signed:   Kalisha Keadle M.D. [facility] 04/28/2012, 3:50 PM Pager: [phone]  If 7PM-7AM, please contact night-coverage [email or web address] Password [email or web address]

## 2012-04-28 NOTE — ED Notes (Signed)
Pt remains in ct 

## 2013-06-03 DIAGNOSIS — E785 Hyperlipidemia, unspecified: Secondary | ICD-10-CM | POA: Insufficient documentation

## 2013-06-03 DIAGNOSIS — E559 Vitamin D deficiency, unspecified: Secondary | ICD-10-CM | POA: Insufficient documentation

## 2015-02-22 DIAGNOSIS — G4733 Obstructive sleep apnea (adult) (pediatric): Secondary | ICD-10-CM | POA: Insufficient documentation

## 2016-08-21 DIAGNOSIS — G8929 Other chronic pain: Secondary | ICD-10-CM | POA: Insufficient documentation

## 2016-08-21 DIAGNOSIS — M545 Low back pain, unspecified: Secondary | ICD-10-CM | POA: Insufficient documentation

## 2016-11-12 DIAGNOSIS — C3412 Malignant neoplasm of upper lobe, left bronchus or lung: Secondary | ICD-10-CM

## 2016-11-12 HISTORY — DX: Malignant neoplasm of upper lobe, left bronchus or lung: C34.12

## 2018-08-18 DIAGNOSIS — I2510 Atherosclerotic heart disease of native coronary artery without angina pectoris: Secondary | ICD-10-CM | POA: Insufficient documentation

## 2019-11-22 DIAGNOSIS — R002 Palpitations: Secondary | ICD-10-CM | POA: Diagnosis not present

## 2019-11-22 DIAGNOSIS — J449 Chronic obstructive pulmonary disease, unspecified: Secondary | ICD-10-CM | POA: Diagnosis not present

## 2019-11-22 DIAGNOSIS — R55 Syncope and collapse: Secondary | ICD-10-CM | POA: Diagnosis not present

## 2019-11-22 DIAGNOSIS — E785 Hyperlipidemia, unspecified: Secondary | ICD-10-CM | POA: Diagnosis not present

## 2019-11-22 DIAGNOSIS — I2510 Atherosclerotic heart disease of native coronary artery without angina pectoris: Secondary | ICD-10-CM | POA: Diagnosis not present

## 2019-11-23 DIAGNOSIS — R002 Palpitations: Secondary | ICD-10-CM | POA: Diagnosis not present

## 2019-11-23 DIAGNOSIS — R55 Syncope and collapse: Secondary | ICD-10-CM | POA: Diagnosis not present

## 2019-11-27 DIAGNOSIS — Z01818 Encounter for other preprocedural examination: Secondary | ICD-10-CM | POA: Diagnosis not present

## 2019-11-29 DIAGNOSIS — R002 Palpitations: Secondary | ICD-10-CM | POA: Diagnosis not present

## 2019-11-29 DIAGNOSIS — R55 Syncope and collapse: Secondary | ICD-10-CM | POA: Diagnosis not present

## 2019-11-30 DIAGNOSIS — Z902 Acquired absence of lung [part of]: Secondary | ICD-10-CM | POA: Diagnosis not present

## 2019-11-30 DIAGNOSIS — G629 Polyneuropathy, unspecified: Secondary | ICD-10-CM | POA: Diagnosis not present

## 2019-11-30 DIAGNOSIS — N1330 Unspecified hydronephrosis: Secondary | ICD-10-CM | POA: Diagnosis not present

## 2019-11-30 DIAGNOSIS — J449 Chronic obstructive pulmonary disease, unspecified: Secondary | ICD-10-CM | POA: Diagnosis not present

## 2019-11-30 DIAGNOSIS — Z886 Allergy status to analgesic agent status: Secondary | ICD-10-CM | POA: Diagnosis not present

## 2019-11-30 DIAGNOSIS — K219 Gastro-esophageal reflux disease without esophagitis: Secondary | ICD-10-CM | POA: Diagnosis not present

## 2019-11-30 DIAGNOSIS — G4733 Obstructive sleep apnea (adult) (pediatric): Secondary | ICD-10-CM | POA: Diagnosis not present

## 2019-11-30 DIAGNOSIS — E559 Vitamin D deficiency, unspecified: Secondary | ICD-10-CM | POA: Diagnosis not present

## 2019-11-30 DIAGNOSIS — Z885 Allergy status to narcotic agent status: Secondary | ICD-10-CM | POA: Diagnosis not present

## 2019-11-30 DIAGNOSIS — Z91048 Other nonmedicinal substance allergy status: Secondary | ICD-10-CM | POA: Diagnosis not present

## 2019-11-30 DIAGNOSIS — E785 Hyperlipidemia, unspecified: Secondary | ICD-10-CM | POA: Diagnosis not present

## 2019-11-30 DIAGNOSIS — Z79899 Other long term (current) drug therapy: Secondary | ICD-10-CM | POA: Diagnosis not present

## 2019-11-30 DIAGNOSIS — E890 Postprocedural hypothyroidism: Secondary | ICD-10-CM | POA: Diagnosis not present

## 2019-11-30 DIAGNOSIS — Z466 Encounter for fitting and adjustment of urinary device: Secondary | ICD-10-CM | POA: Diagnosis not present

## 2019-11-30 DIAGNOSIS — I2510 Atherosclerotic heart disease of native coronary artery without angina pectoris: Secondary | ICD-10-CM | POA: Diagnosis not present

## 2019-11-30 DIAGNOSIS — Z85118 Personal history of other malignant neoplasm of bronchus and lung: Secondary | ICD-10-CM | POA: Diagnosis not present

## 2019-11-30 DIAGNOSIS — N131 Hydronephrosis with ureteral stricture, not elsewhere classified: Secondary | ICD-10-CM | POA: Diagnosis not present

## 2019-12-01 DIAGNOSIS — R55 Syncope and collapse: Secondary | ICD-10-CM | POA: Diagnosis not present

## 2019-12-01 DIAGNOSIS — R002 Palpitations: Secondary | ICD-10-CM | POA: Diagnosis not present

## 2019-12-07 DIAGNOSIS — C3412 Malignant neoplasm of upper lobe, left bronchus or lung: Secondary | ICD-10-CM | POA: Diagnosis not present

## 2019-12-07 DIAGNOSIS — E785 Hyperlipidemia, unspecified: Secondary | ICD-10-CM | POA: Diagnosis not present

## 2019-12-07 DIAGNOSIS — Z0000 Encounter for general adult medical examination without abnormal findings: Secondary | ICD-10-CM | POA: Diagnosis not present

## 2019-12-07 DIAGNOSIS — E559 Vitamin D deficiency, unspecified: Secondary | ICD-10-CM | POA: Diagnosis not present

## 2019-12-07 DIAGNOSIS — E890 Postprocedural hypothyroidism: Secondary | ICD-10-CM | POA: Diagnosis not present

## 2019-12-27 DIAGNOSIS — N200 Calculus of kidney: Secondary | ICD-10-CM | POA: Diagnosis not present

## 2019-12-27 DIAGNOSIS — N135 Crossing vessel and stricture of ureter without hydronephrosis: Secondary | ICD-10-CM | POA: Diagnosis not present

## 2019-12-29 DIAGNOSIS — G4733 Obstructive sleep apnea (adult) (pediatric): Secondary | ICD-10-CM | POA: Diagnosis not present

## 2020-02-04 DIAGNOSIS — Z886 Allergy status to analgesic agent status: Secondary | ICD-10-CM | POA: Diagnosis not present

## 2020-02-04 DIAGNOSIS — M5030 Other cervical disc degeneration, unspecified cervical region: Secondary | ICD-10-CM | POA: Diagnosis not present

## 2020-02-04 DIAGNOSIS — S62616A Displaced fracture of proximal phalanx of right little finger, initial encounter for closed fracture: Secondary | ICD-10-CM | POA: Diagnosis not present

## 2020-02-04 DIAGNOSIS — K219 Gastro-esophageal reflux disease without esophagitis: Secondary | ICD-10-CM | POA: Diagnosis not present

## 2020-02-04 DIAGNOSIS — S92514A Nondisplaced fracture of proximal phalanx of right lesser toe(s), initial encounter for closed fracture: Secondary | ICD-10-CM | POA: Diagnosis not present

## 2020-02-04 DIAGNOSIS — G8911 Acute pain due to trauma: Secondary | ICD-10-CM | POA: Diagnosis not present

## 2020-02-04 DIAGNOSIS — Z9109 Other allergy status, other than to drugs and biological substances: Secondary | ICD-10-CM | POA: Diagnosis not present

## 2020-02-04 DIAGNOSIS — Z9989 Dependence on other enabling machines and devices: Secondary | ICD-10-CM | POA: Diagnosis not present

## 2020-02-04 DIAGNOSIS — J449 Chronic obstructive pulmonary disease, unspecified: Secondary | ICD-10-CM | POA: Diagnosis not present

## 2020-02-04 DIAGNOSIS — E785 Hyperlipidemia, unspecified: Secondary | ICD-10-CM | POA: Diagnosis not present

## 2020-02-04 DIAGNOSIS — Z885 Allergy status to narcotic agent status: Secondary | ICD-10-CM | POA: Diagnosis not present

## 2020-02-04 DIAGNOSIS — Z79899 Other long term (current) drug therapy: Secondary | ICD-10-CM | POA: Diagnosis not present

## 2020-02-04 DIAGNOSIS — I2510 Atherosclerotic heart disease of native coronary artery without angina pectoris: Secondary | ICD-10-CM | POA: Diagnosis not present

## 2020-02-04 DIAGNOSIS — G4733 Obstructive sleep apnea (adult) (pediatric): Secondary | ICD-10-CM | POA: Diagnosis not present

## 2020-02-04 DIAGNOSIS — Z91041 Radiographic dye allergy status: Secondary | ICD-10-CM | POA: Diagnosis not present

## 2020-02-08 DIAGNOSIS — R1310 Dysphagia, unspecified: Secondary | ICD-10-CM | POA: Diagnosis not present

## 2020-02-08 DIAGNOSIS — K219 Gastro-esophageal reflux disease without esophagitis: Secondary | ICD-10-CM | POA: Diagnosis not present

## 2020-02-11 DIAGNOSIS — R300 Dysuria: Secondary | ICD-10-CM | POA: Diagnosis not present

## 2020-02-11 DIAGNOSIS — N1330 Unspecified hydronephrosis: Secondary | ICD-10-CM | POA: Diagnosis not present

## 2020-02-12 DIAGNOSIS — M7989 Other specified soft tissue disorders: Secondary | ICD-10-CM | POA: Diagnosis not present

## 2020-02-12 DIAGNOSIS — G8911 Acute pain due to trauma: Secondary | ICD-10-CM | POA: Diagnosis not present

## 2020-02-12 DIAGNOSIS — G4733 Obstructive sleep apnea (adult) (pediatric): Secondary | ICD-10-CM | POA: Diagnosis not present

## 2020-02-12 DIAGNOSIS — S8001XA Contusion of right knee, initial encounter: Secondary | ICD-10-CM | POA: Diagnosis not present

## 2020-02-12 DIAGNOSIS — E785 Hyperlipidemia, unspecified: Secondary | ICD-10-CM | POA: Diagnosis not present

## 2020-02-12 DIAGNOSIS — S8991XA Unspecified injury of right lower leg, initial encounter: Secondary | ICD-10-CM | POA: Diagnosis not present

## 2020-02-12 DIAGNOSIS — J449 Chronic obstructive pulmonary disease, unspecified: Secondary | ICD-10-CM | POA: Diagnosis not present

## 2020-02-12 DIAGNOSIS — I2510 Atherosclerotic heart disease of native coronary artery without angina pectoris: Secondary | ICD-10-CM | POA: Diagnosis not present

## 2020-02-12 DIAGNOSIS — Z9102 Food additives allergy status: Secondary | ICD-10-CM | POA: Diagnosis not present

## 2020-02-12 DIAGNOSIS — Z885 Allergy status to narcotic agent status: Secondary | ICD-10-CM | POA: Diagnosis not present

## 2020-02-12 DIAGNOSIS — Z91048 Other nonmedicinal substance allergy status: Secondary | ICD-10-CM | POA: Diagnosis not present

## 2020-02-12 DIAGNOSIS — Z886 Allergy status to analgesic agent status: Secondary | ICD-10-CM | POA: Diagnosis not present

## 2020-02-15 DIAGNOSIS — S92514D Nondisplaced fracture of proximal phalanx of right lesser toe(s), subsequent encounter for fracture with routine healing: Secondary | ICD-10-CM | POA: Diagnosis not present

## 2020-02-15 DIAGNOSIS — S8001XD Contusion of right knee, subsequent encounter: Secondary | ICD-10-CM | POA: Diagnosis not present

## 2020-02-23 DIAGNOSIS — Z86010 Personal history of colonic polyps: Secondary | ICD-10-CM | POA: Diagnosis not present

## 2020-02-23 DIAGNOSIS — R1310 Dysphagia, unspecified: Secondary | ICD-10-CM | POA: Diagnosis not present

## 2020-02-23 DIAGNOSIS — K219 Gastro-esophageal reflux disease without esophagitis: Secondary | ICD-10-CM | POA: Diagnosis not present

## 2020-02-28 DIAGNOSIS — R52 Pain, unspecified: Secondary | ICD-10-CM | POA: Diagnosis not present

## 2020-02-28 DIAGNOSIS — S92511A Displaced fracture of proximal phalanx of right lesser toe(s), initial encounter for closed fracture: Secondary | ICD-10-CM | POA: Diagnosis not present

## 2020-02-28 DIAGNOSIS — R1310 Dysphagia, unspecified: Secondary | ICD-10-CM | POA: Diagnosis not present

## 2020-02-28 DIAGNOSIS — K449 Diaphragmatic hernia without obstruction or gangrene: Secondary | ICD-10-CM | POA: Diagnosis not present

## 2020-03-02 DIAGNOSIS — Z538 Procedure and treatment not carried out for other reasons: Secondary | ICD-10-CM | POA: Diagnosis not present

## 2020-03-02 DIAGNOSIS — K648 Other hemorrhoids: Secondary | ICD-10-CM | POA: Diagnosis not present

## 2020-03-02 DIAGNOSIS — Z1211 Encounter for screening for malignant neoplasm of colon: Secondary | ICD-10-CM | POA: Diagnosis not present

## 2020-03-02 DIAGNOSIS — Z86010 Personal history of colonic polyps: Secondary | ICD-10-CM | POA: Diagnosis not present

## 2020-03-02 DIAGNOSIS — Z800 Family history of malignant neoplasm of digestive organs: Secondary | ICD-10-CM | POA: Diagnosis not present

## 2020-03-02 DIAGNOSIS — Z9119 Patient's noncompliance with other medical treatment and regimen: Secondary | ICD-10-CM | POA: Diagnosis not present

## 2020-03-03 DIAGNOSIS — Z800 Family history of malignant neoplasm of digestive organs: Secondary | ICD-10-CM | POA: Diagnosis not present

## 2020-03-03 DIAGNOSIS — K640 First degree hemorrhoids: Secondary | ICD-10-CM | POA: Diagnosis not present

## 2020-03-03 DIAGNOSIS — Z86010 Personal history of colonic polyps: Secondary | ICD-10-CM | POA: Diagnosis not present

## 2020-03-03 DIAGNOSIS — Z1211 Encounter for screening for malignant neoplasm of colon: Secondary | ICD-10-CM | POA: Diagnosis not present

## 2020-03-08 DIAGNOSIS — E039 Hypothyroidism, unspecified: Secondary | ICD-10-CM | POA: Diagnosis not present

## 2020-03-22 DIAGNOSIS — J449 Chronic obstructive pulmonary disease, unspecified: Secondary | ICD-10-CM | POA: Diagnosis not present

## 2020-03-22 DIAGNOSIS — G4733 Obstructive sleep apnea (adult) (pediatric): Secondary | ICD-10-CM | POA: Diagnosis not present

## 2020-03-22 DIAGNOSIS — Z01812 Encounter for preprocedural laboratory examination: Secondary | ICD-10-CM | POA: Diagnosis not present

## 2020-03-22 DIAGNOSIS — R0602 Shortness of breath: Secondary | ICD-10-CM | POA: Diagnosis not present

## 2020-03-22 DIAGNOSIS — Z20822 Contact with and (suspected) exposure to covid-19: Secondary | ICD-10-CM | POA: Diagnosis not present

## 2020-03-22 DIAGNOSIS — I2510 Atherosclerotic heart disease of native coronary artery without angina pectoris: Secondary | ICD-10-CM | POA: Diagnosis not present

## 2020-04-09 ENCOUNTER — Inpatient Hospital Stay (HOSPITAL_COMMUNITY)
Admission: EM | Admit: 2020-04-09 | Discharge: 2020-04-12 | DRG: 193 | Disposition: A | Discharge status: Home / Self Care | Payer: Medicare Other | Attending: Internal Medicine | Admitting: Internal Medicine

## 2020-04-09 ENCOUNTER — Emergency Department (HOSPITAL_COMMUNITY): Payer: Medicare Other

## 2020-04-09 ENCOUNTER — Encounter (HOSPITAL_COMMUNITY): Payer: Self-pay

## 2020-04-09 ENCOUNTER — Other Ambulatory Visit: Payer: Self-pay

## 2020-04-09 DIAGNOSIS — J9601 Acute respiratory failure with hypoxia: Secondary | ICD-10-CM | POA: Diagnosis present

## 2020-04-09 DIAGNOSIS — J189 Pneumonia, unspecified organism: Secondary | ICD-10-CM | POA: Diagnosis not present

## 2020-04-09 DIAGNOSIS — Z888 Allergy status to other drugs, medicaments and biological substances status: Secondary | ICD-10-CM | POA: Diagnosis not present

## 2020-04-09 DIAGNOSIS — R0902 Hypoxemia: Secondary | ICD-10-CM | POA: Diagnosis not present

## 2020-04-09 DIAGNOSIS — R05 Cough: Secondary | ICD-10-CM | POA: Diagnosis not present

## 2020-04-09 DIAGNOSIS — J440 Chronic obstructive pulmonary disease with acute lower respiratory infection: Secondary | ICD-10-CM | POA: Diagnosis not present

## 2020-04-09 DIAGNOSIS — J9621 Acute and chronic respiratory failure with hypoxia: Secondary | ICD-10-CM | POA: Diagnosis present

## 2020-04-09 DIAGNOSIS — Z79899 Other long term (current) drug therapy: Secondary | ICD-10-CM

## 2020-04-09 DIAGNOSIS — Z20822 Contact with and (suspected) exposure to covid-19: Secondary | ICD-10-CM | POA: Diagnosis present

## 2020-04-09 DIAGNOSIS — G4730 Sleep apnea, unspecified: Secondary | ICD-10-CM | POA: Diagnosis not present

## 2020-04-09 DIAGNOSIS — K219 Gastro-esophageal reflux disease without esophagitis: Secondary | ICD-10-CM | POA: Diagnosis not present

## 2020-04-09 DIAGNOSIS — Z720 Tobacco use: Secondary | ICD-10-CM | POA: Diagnosis present

## 2020-04-09 DIAGNOSIS — J449 Chronic obstructive pulmonary disease, unspecified: Secondary | ICD-10-CM | POA: Diagnosis not present

## 2020-04-09 DIAGNOSIS — Z886 Allergy status to analgesic agent status: Secondary | ICD-10-CM | POA: Diagnosis not present

## 2020-04-09 DIAGNOSIS — E890 Postprocedural hypothyroidism: Secondary | ICD-10-CM | POA: Diagnosis present

## 2020-04-09 DIAGNOSIS — Z9221 Personal history of antineoplastic chemotherapy: Secondary | ICD-10-CM

## 2020-04-09 DIAGNOSIS — R0602 Shortness of breath: Secondary | ICD-10-CM | POA: Diagnosis not present

## 2020-04-09 DIAGNOSIS — Z681 Body mass index (BMI) 19 or less, adult: Secondary | ICD-10-CM | POA: Diagnosis not present

## 2020-04-09 DIAGNOSIS — F17210 Nicotine dependence, cigarettes, uncomplicated: Secondary | ICD-10-CM | POA: Diagnosis not present

## 2020-04-09 DIAGNOSIS — I499 Cardiac arrhythmia, unspecified: Secondary | ICD-10-CM | POA: Diagnosis not present

## 2020-04-09 DIAGNOSIS — Z85118 Personal history of other malignant neoplasm of bronchus and lung: Secondary | ICD-10-CM | POA: Diagnosis not present

## 2020-04-09 DIAGNOSIS — Z79890 Hormone replacement therapy (postmenopausal): Secondary | ICD-10-CM

## 2020-04-09 DIAGNOSIS — R64 Cachexia: Secondary | ICD-10-CM | POA: Diagnosis not present

## 2020-04-09 DIAGNOSIS — E039 Hypothyroidism, unspecified: Secondary | ICD-10-CM | POA: Diagnosis present

## 2020-04-09 DIAGNOSIS — Z743 Need for continuous supervision: Secondary | ICD-10-CM | POA: Diagnosis not present

## 2020-04-09 DIAGNOSIS — J441 Chronic obstructive pulmonary disease with (acute) exacerbation: Secondary | ICD-10-CM | POA: Diagnosis not present

## 2020-04-09 DIAGNOSIS — R9431 Abnormal electrocardiogram [ECG] [EKG]: Secondary | ICD-10-CM | POA: Diagnosis not present

## 2020-04-09 HISTORY — DX: Hypothyroidism, unspecified: E03.9

## 2020-04-09 HISTORY — DX: Chronic obstructive pulmonary disease, unspecified: J44.9

## 2020-04-09 LAB — BASIC METABOLIC PANEL
Anion gap: 15 (ref 5–15)
BUN: 19 mg/dL (ref 8–23)
CO2: 24 mmol/L (ref 22–32)
Calcium: 9.3 mg/dL (ref 8.9–10.3)
Chloride: 102 mmol/L (ref 98–111)
Creatinine, Ser: 0.74 mg/dL (ref 0.44–1.00)
GFR calc Af Amer: >60 mL/min (ref >60)
GFR calc non Af Amer: >60 mL/min (ref >60)
Glucose, Bld: 108 mg/dL — ABNORMAL HIGH (ref 70–99)
Potassium: 3.3 mmol/L — ABNORMAL LOW (ref 3.5–5.1)
Sodium: 141 mmol/L (ref 135–145)

## 2020-04-09 LAB — CBC WITH DIFFERENTIAL/PLATELET
Abs Immature Granulocytes: 0.18 10*3/uL — ABNORMAL HIGH (ref 0.00–0.07)
Basophils Absolute: 0.1 10*3/uL (ref 0.0–0.1)
Basophils Relative: 0 %
Eosinophils Absolute: 0 10*3/uL (ref 0.0–0.5)
Eosinophils Relative: 0 %
HCT: 36.1 % (ref 36.0–46.0)
Hemoglobin: 12 g/dL (ref 12.0–15.0)
Immature Granulocytes: 1 %
Lymphocytes Relative: 6 %
Lymphs Abs: 1.4 10*3/uL (ref 0.7–4.0)
MCH: 29.9 pg (ref 26.0–34.0)
MCHC: 33.2 g/dL (ref 30.0–36.0)
MCV: 89.8 fL (ref 80.0–100.0)
Monocytes Absolute: 1.1 10*3/uL — ABNORMAL HIGH (ref 0.1–1.0)
Monocytes Relative: 5 %
Neutro Abs: 19.4 10*3/uL — ABNORMAL HIGH (ref 1.7–7.7)
Neutrophils Relative %: 88 %
Platelets: 370 10*3/uL (ref 150–400)
RBC: 4.02 MIL/uL (ref 3.87–5.11)
RDW: 13.7 % (ref 11.5–15.5)
WBC: 22.1 10*3/uL — ABNORMAL HIGH (ref 4.0–10.5)
nRBC: 0 % (ref 0.0–0.2)

## 2020-04-09 LAB — TROPONIN I (HIGH SENSITIVITY): Troponin I (High Sensitivity): 9 ng/L (ref <18)

## 2020-04-09 LAB — BRAIN NATRIURETIC PEPTIDE: B Natriuretic Peptide: 183.6 pg/mL — ABNORMAL HIGH (ref 0.0–100.0)

## 2020-04-09 LAB — I-STAT CREATININE, ED: Creatinine, Ser: 0.6 mg/dL (ref 0.44–1.00)

## 2020-04-09 MED ORDER — IOHEXOL 350 MG/ML SOLN
80.0000 mL | Freq: Once | INTRAVENOUS | Status: AC | PRN
  Administered 2020-04-09: 80 mL via INTRAVENOUS

## 2020-04-09 MED ORDER — SODIUM CHLORIDE (PF) 0.9 % IJ SOLN
INTRAMUSCULAR | Status: AC
  Administered 2020-04-10: 1 mL
  Filled 2020-04-09: qty 50

## 2020-04-09 MED ORDER — ALBUTEROL SULFATE HFA 108 (90 BASE) MCG/ACT IN AERS
8.0000 | INHALATION_SPRAY | Freq: Once | RESPIRATORY_TRACT | Status: DC
  Administered 2020-04-10: 8 via RESPIRATORY_TRACT
  Filled 2020-04-09: qty 6.7

## 2020-04-09 MED ORDER — METHYLPREDNISOLONE SODIUM SUCC 125 MG IJ SOLR
125.0000 mg | Freq: Once | INTRAMUSCULAR | Status: DC
  Administered 2020-04-10: 125 mg via INTRAVENOUS
  Filled 2020-04-09: qty 2

## 2020-04-09 MED ORDER — IPRATROPIUM BROMIDE HFA 17 MCG/ACT IN AERS: 2.0000 | INHALATION_SPRAY | Freq: Once | RESPIRATORY_TRACT | Status: DC

## 2020-04-09 MED ORDER — MAGNESIUM SULFATE 2 GM/50ML IV SOLN
2.0000 g | Freq: Once | INTRAVENOUS | Status: AC
  Administered 2020-04-10: 2 g via INTRAVENOUS
  Filled 2020-04-09: qty 50

## 2020-04-09 NOTE — ED Provider Notes (Signed)
Tracy Park Note   CSN: [id number] Arrival date & time: 04/09/20  2058     History Chief Complaint  Patient presents with  . Shortness of Breath    Tracy Park is a 64 y.o. female.  Tracy Park is a 64 year old female with past medical history significant for COPD, OSA (on CPAP), tobacco use, and stage IIIA LUL adenocarcinoma (in remission) who presented to [facility] ED on 04/09/2020 for evaluation of shortness of breath.  Patient reports having significant travel over the past few months including travel to [place], [place], and [place]. She felt fine during her travel and denies having any known sick contacts during this time. She was unmasked for a large portion of her travel. Upon returning to [facility], on July 23rd, she states that she began to develop shortness of breath. She states that her shortness of breath is worse from her baseline and has been waxing and waning in severity since its onset, however it became noticeably worse today. She states that she uses Advair and an albuterol inhaler regularly for her COPD, however she needed to use her albuterol inhaler nine times today without resolution of her shortness of breath. Due to her symptoms, her partner and daughter encouraged her to come to the ED for evaluation. In addition to her shortness of breath, she endorses chest tightness and dry cough both of which are reportedly chronic problems for her. She also endorses subjective fevers at home, but did not take her temperature. She otherwise denies leg swelling, calf tenderness, abdominal pain, nausea, diaphoresis, vomiting, diarrhea.   Of note, she follows closely with pulmonology and was last seen on 03/22/2020. In 2017, she was diagnosed with mild COPD. In 2018 and 2019, her COPD was graded as moderate. In 2020 and 2021, her PFTs were unremarkable and she was determined to not have COPD or only a mild form of the  condition. She was encouraged to stop using her incruse on the 14th due to improvement of her pulmonary function.  The history is provided by the patient and medical records. No language interpreter was used.  Shortness of Breath Severity:  Severe Onset quality:  Gradual Timing:  Intermittent Progression:  Worsening Chronicity:  Chronic Relieved by:  Nothing Worsened by:  Deep breathing, exertion and coughing Ineffective treatments:  Inhaler Associated symptoms: cough and fever   Associated symptoms: no abdominal pain, no chest pain, no ear pain, no rash, no sore throat and no vomiting   Fever:    Temp source:  Subjective Risk factors: hx of cancer and tobacco use       Past Medical History:  Diagnosis Date  . Thyroid disease    Patient Active Problem List   Diagnosis Date Noted  . GERD (gastroesophageal reflux disease) 04/28/2012  . Hypothyroidism (acquired) 04/28/2012  . Nicotine abuse 04/28/2012  . Depression 04/28/2012  . Chest pain, atypical 04/28/2012   Family History: -Mother, breast and ovarian cancer -Sister, esophageal cancer -Brother, colon cancer  Social History: -Currently smokes 1-2 cigarettes/day; 19 pack year smoking history -Denies alcohol or recreational drug use  Home Medications Prior to Admission medications   Medication Sig Start Date End Date Taking? Authorizing Park  levothyroxine (SYNTHROID, LEVOTHROID) 112 MCG tablet Take 112 mcg by mouth daily.    Park, Historical, MD  omeprazole (PRILOSEC) 20 MG capsule Take 40 mg by mouth daily.    Park, Historical, MD  sertraline (ZOLOFT) 100 MG tablet Take 100 mg by  mouth daily.    Park, Historical, MD    Allergies    Aspirin and Tape  Review of Systems   Review of Systems  Constitutional: Positive for fever. Negative for chills.  HENT: Negative for ear pain and sore throat.   Eyes: Negative for pain and visual disturbance.  Respiratory: Positive for cough and shortness of breath.    Cardiovascular: Negative for chest pain and palpitations.  Gastrointestinal: Negative for abdominal pain and vomiting.  Genitourinary: Negative for dysuria and hematuria.  Musculoskeletal: Negative for arthralgias and back pain.  Skin: Negative for color change and rash.  Neurological: Negative for seizures and syncope.  All other systems reviewed and are negative.   Physical Exam Updated Vital Signs BP (!) 125/98 (BP Location: Right Arm)   Pulse 103   Temp 98 F (36.7 C) (Oral)   Resp 18   Ht 5\' 3"  (1.6 m)   Wt (!) 40.8 kg   SpO2 91%   BMI 15.94 kg/m   Physical Exam Constitutional:      General: She is in acute distress.     Appearance: She is not diaphoretic.     Comments: Cachetic  HENT:     Head: Normocephalic and atraumatic.     Mouth/Throat:     Mouth: Mucous membranes are moist.     Pharynx: Oropharynx is clear.  Eyes:     Extraocular Movements: Extraocular movements intact.     Pupils: Pupils are equal, round, and reactive to light.  Cardiovascular:     Rate and Rhythm: Regular rhythm. Tachycardia present.  Pulmonary:     Effort: Accessory muscle usage and respiratory distress present.     Breath sounds: Examination of the left-upper field reveals rales. Examination of the right-lower field reveals decreased breath sounds. Examination of the left-lower field reveals decreased breath sounds. Decreased breath sounds and rales present. No wheezing.  Abdominal:     General: Bowel sounds are normal.     Palpations: Abdomen is soft.     Tenderness: There is no abdominal tenderness.  Musculoskeletal:        General: Normal range of motion.     Cervical back: Normal range of motion and neck supple.     Right lower leg: No tenderness. No edema.     Left lower leg: No tenderness. No edema.  Skin:    General: Skin is warm and dry.  Neurological:     General: No focal deficit present.     Mental Status: She is alert and oriented to person, place, and time.    Psychiatric:        Mood and Affect: Mood is anxious.        Behavior: Behavior normal.    ED Results / Procedures / Treatments   Labs (all labs ordered are listed, but only abnormal results are displayed) Labs Reviewed  CBC WITH DIFFERENTIAL/PLATELET - Abnormal; Notable for the following components:      Result Value   WBC 22.1 (*)    Neutro Abs 19.4 (*)    Monocytes Absolute 1.1 (*)    Abs Immature Granulocytes 0.18 (*)    All other components within normal limits  BASIC METABOLIC PANEL - Abnormal; Notable for the following components:   Potassium 3.3 (*)    Glucose, Bld 108 (*)    All other components within normal limits  BRAIN NATRIURETIC PEPTIDE - Abnormal; Notable for the following components:   B Natriuretic Peptide 183.6 (*)    All other  components within normal limits  SARS CORONAVIRUS 2 BY RT PCR (HOSPITAL ORDER, [facility])  I-STAT CREATININE, ED  TROPONIN I (HIGH SENSITIVITY)   EKG None  Radiology DG Chest 2 View  Result Date: 04/09/2020 CLINICAL DATA:  Shortness of breath EXAM: CHEST - 2 VIEW COMPARISON:  April 28, 2012 FINDINGS: The heart size and mediastinal contours are within normal limits. Focal ill-defined nodular opacity seen within the left upper lung and right lung base. There is diffusely increased interstitial markings seen throughout both lungs, left greater than right. There is hyperinflation of the lung zones with flattening of the hemidiaphragms. Biapical scarring is noted. No acute osseous abnormality. IMPRESSION: Ill-defined nodular opacities in the periphery of the left upper lung and right lung base which are nonspecific could be areas of focal scarring versus pulmonary nodules given the patient's history. Diffusely increased interstitial markings, left greater than right which could be due to infectious or inflammatory process. Findings of COPD. Electronically Signed   By: Prudencio Pair M.D.   On: 04/09/2020 21:55    Procedures Procedures (including critical care time)  Medications Ordered in ED Medications  albuterol (VENTOLIN HFA) 108 (90 Base) MCG/ACT inhaler 8 puff (has no administration in time range)  ipratropium (ATROVENT HFA) inhaler 2 puff (has no administration in time range)  methylPREDNISolone sodium succinate (SOLU-MEDROL) 125 mg/2 mL injection 125 mg (has no administration in time range)  magnesium sulfate IVPB 2 g 50 mL (has no administration in time range)  sodium chloride (PF) 0.9 % injection (has no administration in time range)  iohexol (OMNIPAQUE) 350 MG/ML injection 80 mL (80 mLs Intravenous Contrast Given 04/09/20 2305)    ED Course  I have reviewed the triage vital signs and the nursing notes.  Pertinent labs & imaging results that were available during my care of the patient were reviewed by me and considered in my medical decision making (see chart for details).    MDM Rules/Calculators/A&P                          Ms. Ojala is a 64 year old female with past medical history significant for COPD, OSA (on CPAP), tobacco use, and stage IIIA LUL adenocarcinoma (in remission) who presented to [facility] ED on 04/09/2020 for evaluation of shortness of breath.  Patient presenting with progression of shortness of breath, dry cough and subjective fevers. She is mildly tachycardic and hypoxic to the high 80s on physical exam with accessory muscle use on respiratory exam. Initial radiologic evidence demonstrates ill-defined nodular opacities in the periphery of the left upper lung and right lung base. Diffusely increased interstitial markings, left greater than right which could be due to infectious or inflammatory process. Given these findings in the setting of significant recent travel without masking, patient may have an underlying pulmonary infection. Given her history of COPD, an exacerbation of her chronic illness is likely and we plan to treat with 8puffs of albuterol, 2 puffs  of atrovent, 125mg  injection of methylprednisolone, and 2g of IV magnesium sulfate. Alternatively, pulmonary embolism remains a consideration in the setting of her recent travel, hypoxia, tachycardia and tachypnea. I-Stat creatinine reveals adequate kidney function so we will obtain CTA Chest to rule this out.  Initial labs reveal CBC, WBC 22.1 with a neutrophilic predominance (82.9) however otherwise within normal limits. BMP revealed K 3.3. Troponin 9. BNP mildly elevated 183.6.  CTA Chest and COVID test are pending.   Case  was discussed with Dr. Darl Householder who will resume care of the patient.   Final Clinical Impression(s) / ED Diagnoses Final diagnoses:  COPD exacerbation [facility])    Rx / DC Orders ED Discharge Orders    None       Paulla Dolly, MD 04/09/20 [place], [place], DO 04/09/20 2324

## 2020-04-09 NOTE — ED Notes (Signed)
Pt's daughter: Janett Billow [phone]  Call for updates and if pt is discharged

## 2020-04-10 ENCOUNTER — Encounter (HOSPITAL_COMMUNITY): Payer: Self-pay | Admitting: Internal Medicine

## 2020-04-10 DIAGNOSIS — J189 Pneumonia, unspecified organism: Secondary | ICD-10-CM | POA: Diagnosis present

## 2020-04-10 DIAGNOSIS — J9601 Acute respiratory failure with hypoxia: Secondary | ICD-10-CM | POA: Diagnosis present

## 2020-04-10 DIAGNOSIS — J9621 Acute and chronic respiratory failure with hypoxia: Secondary | ICD-10-CM | POA: Diagnosis present

## 2020-04-10 LAB — TROPONIN I (HIGH SENSITIVITY): Troponin I (High Sensitivity): 8 ng/L (ref <18)

## 2020-04-10 LAB — BASIC METABOLIC PANEL
Anion gap: 16 — ABNORMAL HIGH (ref 5–15)
BUN: 21 mg/dL (ref 8–23)
CO2: 26 mmol/L (ref 22–32)
Calcium: 9.3 mg/dL (ref 8.9–10.3)
Chloride: 99 mmol/L (ref 98–111)
Creatinine, Ser: 0.76 mg/dL (ref 0.44–1.00)
GFR calc Af Amer: >60 mL/min (ref >60)
GFR calc non Af Amer: >60 mL/min (ref >60)
Glucose, Bld: 108 mg/dL — ABNORMAL HIGH (ref 70–99)
Potassium: 3.6 mmol/L (ref 3.5–5.1)
Sodium: 141 mmol/L (ref 135–145)

## 2020-04-10 LAB — CBC
HCT: 35.6 % — ABNORMAL LOW (ref 36.0–46.0)
Hemoglobin: 11.7 g/dL — ABNORMAL LOW (ref 12.0–15.0)
MCH: 29.6 pg (ref 26.0–34.0)
MCHC: 32.9 g/dL (ref 30.0–36.0)
MCV: 90.1 fL (ref 80.0–100.0)
Platelets: 393 10*3/uL (ref 150–400)
RBC: 3.95 MIL/uL (ref 3.87–5.11)
RDW: 13.7 % (ref 11.5–15.5)
WBC: 18.6 10*3/uL — ABNORMAL HIGH (ref 4.0–10.5)
nRBC: 0 % (ref 0.0–0.2)

## 2020-04-10 LAB — SARS CORONAVIRUS 2 BY RT PCR (HOSPITAL ORDER, PERFORMED IN ~~LOC~~ HOSPITAL LAB): SARS Coronavirus 2: NEGATIVE

## 2020-04-10 LAB — LACTIC ACID, PLASMA: Lactic Acid, Venous: 0.7 mmol/L (ref 0.5–1.9)

## 2020-04-10 LAB — HIV ANTIBODY (ROUTINE TESTING W REFLEX): HIV Screen 4th Generation wRfx: NONREACTIVE

## 2020-04-10 MED ORDER — PANTOPRAZOLE SODIUM 40 MG PO TBEC
40.0000 mg | DELAYED_RELEASE_TABLET | Freq: Every day | ORAL | Status: DC
  Administered 2020-04-10 – 2020-04-12 (×3): 40 mg via ORAL
  Filled 2020-04-10 (×3): qty 1

## 2020-04-10 MED ORDER — ALBUTEROL SULFATE (2.5 MG/3ML) 0.083% IN NEBU
2.5000 mg | INHALATION_SOLUTION | RESPIRATORY_TRACT | Status: DC | PRN
  Administered 2020-04-11: 2.5 mg via RESPIRATORY_TRACT
  Filled 2020-04-10: qty 3

## 2020-04-10 MED ORDER — LEVOTHYROXINE SODIUM 25 MCG PO TABS
137.0000 ug | ORAL_TABLET | Freq: Every day | ORAL | Status: DC
  Administered 2020-04-10 – 2020-04-12 (×3): 137 ug via ORAL
  Filled 2020-04-10 (×3): qty 1

## 2020-04-10 MED ORDER — ONDANSETRON HCL 4 MG PO TABS: 4.0000 mg | ORAL_TABLET | Freq: Four times a day (QID) | ORAL | Status: DC | PRN

## 2020-04-10 MED ORDER — OXYBUTYNIN CHLORIDE 5 MG PO TABS
5.0000 mg | ORAL_TABLET | Freq: Three times a day (TID) | ORAL | Status: DC
  Administered 2020-04-10 – 2020-04-12 (×7): 5 mg via ORAL
  Filled 2020-04-10 (×7): qty 1

## 2020-04-10 MED ORDER — QUETIAPINE FUMARATE 100 MG PO TABS
200.0000 mg | ORAL_TABLET | Freq: Every day | ORAL | Status: DC
  Administered 2020-04-10 – 2020-04-11 (×2): 200 mg via ORAL
  Filled 2020-04-10 (×2): qty 2

## 2020-04-10 MED ORDER — ENOXAPARIN SODIUM 30 MG/0.3ML ~~LOC~~ SOLN
30.0000 mg | SUBCUTANEOUS | Status: DC
  Filled 2020-04-10 (×2): qty 0.3

## 2020-04-10 MED ORDER — IPRATROPIUM BROMIDE 0.02 % IN SOLN
0.5000 mg | RESPIRATORY_TRACT | Status: DC
  Administered 2020-04-10: 0.5 mg via RESPIRATORY_TRACT
  Filled 2020-04-10: qty 2.5

## 2020-04-10 MED ORDER — FAMOTIDINE 20 MG PO TABS
40.0000 mg | ORAL_TABLET | Freq: Every day | ORAL | Status: DC | PRN
  Administered 2020-04-10: 40 mg via ORAL
  Filled 2020-04-10: qty 2

## 2020-04-10 MED ORDER — TAMSULOSIN HCL 0.4 MG PO CAPS
0.4000 mg | ORAL_CAPSULE | Freq: Every day | ORAL | Status: DC
  Administered 2020-04-10 – 2020-04-12 (×3): 0.4 mg via ORAL
  Filled 2020-04-10 (×3): qty 1

## 2020-04-10 MED ORDER — ROSUVASTATIN CALCIUM 20 MG PO TABS
20.0000 mg | ORAL_TABLET | Freq: Every day | ORAL | Status: DC
  Administered 2020-04-10 – 2020-04-11 (×2): 20 mg via ORAL
  Filled 2020-04-10 (×3): qty 1

## 2020-04-10 MED ORDER — PRAZOSIN HCL 1 MG PO CAPS
2.0000 mg | ORAL_CAPSULE | Freq: Every day | ORAL | Status: DC
  Administered 2020-04-10 – 2020-04-11 (×2): 2 mg via ORAL
  Filled 2020-04-10 (×2): qty 2

## 2020-04-10 MED ORDER — METHOCARBAMOL 500 MG PO TABS: 500.0000 mg | ORAL_TABLET | Freq: Two times a day (BID) | ORAL | Status: DC | PRN

## 2020-04-10 MED ORDER — BUPROPION HCL ER (XL) 300 MG PO TB24
300.0000 mg | ORAL_TABLET | Freq: Every day | ORAL | Status: DC
  Administered 2020-04-10 – 2020-04-12 (×3): 300 mg via ORAL
  Filled 2020-04-10: qty 2
  Filled 2020-04-10 (×2): qty 1

## 2020-04-10 MED ORDER — ALBUTEROL SULFATE (2.5 MG/3ML) 0.083% IN NEBU
2.5000 mg | INHALATION_SOLUTION | RESPIRATORY_TRACT | Status: DC
  Administered 2020-04-10: 2.5 mg via RESPIRATORY_TRACT
  Filled 2020-04-10: qty 3

## 2020-04-10 MED ORDER — SODIUM CHLORIDE 0.9 % IV SOLN
2.0000 g | INTRAVENOUS | Status: DC
  Administered 2020-04-10 – 2020-04-12 (×3): 2 g via INTRAVENOUS
  Filled 2020-04-10 (×2): qty 2
  Filled 2020-04-10: qty 20

## 2020-04-10 MED ORDER — GUAIFENESIN 100 MG/5ML PO SOLN
10.0000 mL | Freq: Four times a day (QID) | ORAL | Status: AC | PRN
  Administered 2020-04-10 – 2020-04-11 (×2): 200 mg via ORAL
  Filled 2020-04-10 (×2): qty 10

## 2020-04-10 MED ORDER — SODIUM CHLORIDE 0.9 % IV SOLN
500.0000 mg | INTRAVENOUS | Status: DC
  Administered 2020-04-10 – 2020-04-12 (×3): 500 mg via INTRAVENOUS
  Filled 2020-04-10 (×3): qty 500

## 2020-04-10 MED ORDER — ONDANSETRON HCL 4 MG/2ML IJ SOLN
4.0000 mg | Freq: Four times a day (QID) | INTRAMUSCULAR | Status: DC | PRN
  Administered 2020-04-10 – 2020-04-11 (×2): 4 mg via INTRAVENOUS
  Filled 2020-04-10 (×2): qty 2

## 2020-04-10 MED ORDER — IPRATROPIUM-ALBUTEROL 0.5-2.5 (3) MG/3ML IN SOLN
3.0000 mL | Freq: Two times a day (BID) | RESPIRATORY_TRACT | Status: DC
  Administered 2020-04-10 – 2020-04-12 (×4): 3 mL via RESPIRATORY_TRACT
  Filled 2020-04-10 (×4): qty 3

## 2020-04-10 MED ORDER — BUDESONIDE 0.25 MG/2ML IN SUSP
0.2500 mg | Freq: Two times a day (BID) | RESPIRATORY_TRACT | Status: DC
  Administered 2020-04-10 – 2020-04-12 (×5): 0.25 mg via RESPIRATORY_TRACT
  Filled 2020-04-10 (×5): qty 2

## 2020-04-10 NOTE — H&P (Signed)
History and Physical    Tracy Park VVO:[id number] DOB: 02-12-1956 DOA: 04/09/2020  PCP: System, Provider Not In  Patient coming from: Home.  Chief Complaint: Shortness of breath.  HPI: Tracy Park is a 64 y.o. female with history of COPD with ongoing tobacco abuse, sleep apnea, lung cancer status post chemotherapy 2 years ago being followed by a pulmonologist, thyroidectomy with Synthroid presents to the ER after patient has been having persistent shortness of breath with cough over the last 1 week.  Patient recently traveled out of the state and returned last week.  Since returning patient has been having persistent cough initially productive presently nonproductive.  Has some chest pressure on coughing.  Otherwise chest pain-free.  Denies fever chills.  ED Course: In the ER patient was mildly hypoxic requiring 2 L oxygen.  Since patient had recent travel CT angiogram of the chest was done which was negative for pulmonary embolism but was showing multifocal pneumonia.  Covid test was negative WBC count was 22,000 BNP 183 high 62.9 potassium 3.3.  Patient was started on empiric antibiotics for pneumonia and also was given 1 dose of steroids because of initial wheezing.  Review of Systems: As per HPI, rest all negative.   Past Medical History:  Diagnosis Date  . COPD (chronic obstructive pulmonary disease) ([place])   . Hypothyroidism   . Thyroid disease     Past Surgical History:  Procedure Laterality Date  . kidney stent    . thyroidectomy       reports that she has been smoking cigarettes. She has never used smokeless tobacco. She reports previous alcohol use. She reports previous drug use.  Allergies  Allergen Reactions  . Red Dye Hives and Itching  . Aspirin Hives  . Tape Rash    Family History  Family history unknown: Yes    Prior to Admission medications   Medication Sig Start Date End Date Taking? Authorizing Provider  albuterol (VENTOLIN HFA) 108 (90 Base)  MCG/ACT inhaler Inhale 2 puffs into the lungs every 6 (six) hours as needed for wheezing or shortness of breath.  03/23/20  Yes [provider]  buPROPion (WELLBUTRIN XL) 300 MG 24 hr tablet Take 300 mg by mouth daily after breakfast. 01/18/20  Yes [provider]  famotidine (PEPCID) 40 MG tablet Take 40 mg by mouth daily as needed for heartburn or indigestion.  02/14/20  Yes [provider]  levothyroxine (SYNTHROID) 137 MCG tablet Take 137 mcg by mouth daily before breakfast. 03/14/20  Yes [provider]  meloxicam (MOBIC) 15 MG tablet Take 15 mg by mouth daily. 04/07/20  Yes [provider]  methocarbamol (ROBAXIN) 500 MG tablet Take 500 mg by mouth every 12 (twelve) hours as needed for muscle spasms.  02/10/20  Yes [provider]  omeprazole (PRILOSEC) 20 MG capsule Take 40 mg by mouth daily.   Yes [provider]  ondansetron (ZOFRAN) 4 MG tablet Take 4 mg by mouth every 8 (eight) hours as needed for nausea or vomiting.  03/10/20  Yes [provider]  oxybutynin (DITROPAN) 5 MG tablet Take 5 mg by mouth 3 (three) times daily. 03/15/20  Yes [provider]  prazosin (MINIPRESS) 2 MG capsule Take 2 mg by mouth at bedtime. 01/11/20  Yes [provider]  QUEtiapine (SEROQUEL) 200 MG tablet Take 200 mg by mouth at bedtime. 01/18/20  Yes [provider]  rosuvastatin (CRESTOR) 20 MG tablet Take 20 mg by mouth daily. 01/15/20  Yes [provider]  tamsulosin (FLOMAX) 0.4 MG CAPS capsule Take 0.4 mg by mouth daily. 03/21/20  Yes [provider]    Physical Exam: Constitutional: Moderately built and nourished. Vitals:   04/09/20 2125  BP: (!) 125/98  Pulse: 103  Resp: 18  Temp: 98 F (36.7 C)  TempSrc: Oral  SpO2: 91%  Weight: (!) 40.8 kg  Height: 5\' 3"  (1.6 m)   Eyes: Anicteric no pallor. ENMT: No discharge from the ears eyes nose or mouth. Neck: No mass felt.  No JVD  appreciated. Respiratory: No rhonchi or crepitations. Cardiovascular: S1-S2 heard. Abdomen: Soft nontender bowel sounds present. Musculoskeletal: No edema. Skin: No rash. Neurologic: Alert awake oriented to time place and person.  Moves all extremities. Psychiatric: Appears normal.  Normal affect.   Labs on Admission: I have personally reviewed following labs and imaging studies  CBC: Recent Labs  Lab 04/09/20 2217  WBC 22.1*  NEUTROABS 19.4*  HGB 12.0  HCT 36.1  MCV 89.8  PLT 841   Basic Metabolic Panel: Recent Labs  Lab 04/09/20 2217 04/09/20 2226  NA 141  --   K 3.3*  --   CL 102  --   CO2 24  --   GLUCOSE 108*  --   BUN 19  --   CREATININE 0.74 0.60  CALCIUM 9.3  --    GFR: Estimated Creatinine Clearance: 47 mL/min (by C-G formula based on SCr of 0.6 mg/dL). Liver Function Tests: No results for input(s): AST, ALT, ALKPHOS, BILITOT, PROT, ALBUMIN in the last 168 hours. No results for input(s): LIPASE, AMYLASE in the last 168 hours. No results for input(s): AMMONIA in the last 168 hours. Coagulation Profile: No results for input(s): INR, PROTIME in the last 168 hours. Cardiac Enzymes: No results for input(s): CKTOTAL, CKMB, CKMBINDEX, TROPONINI in the last 168 hours. BNP (last 3 results) No results for input(s): PROBNP in the last 8760 hours. HbA1C: No results for input(s): HGBA1C in the last 72 hours. CBG: No results for input(s): GLUCAP in the last 168 hours. Lipid Profile: No results for input(s): CHOL, HDL, LDLCALC, TRIG, CHOLHDL, LDLDIRECT in the last 72 hours. Thyroid Function Tests: No results for input(s): TSH, T4TOTAL, FREET4, T3FREE, THYROIDAB in the last 72 hours. Anemia Panel: No results for input(s): VITAMINB12, FOLATE, FERRITIN, TIBC, IRON, RETICCTPCT in the last 72 hours. Urine analysis:    Component Value Date/Time   COLORURINE YELLOW 11/06/2009 1559   APPEARANCEUR CLEAR 11/06/2009 1559   LABSPEC 1.011 11/06/2009 1559   PHURINE 6.0  11/06/2009 1559   GLUCOSEU NEGATIVE 11/06/2009 1559   HGBUR NEGATIVE 11/06/2009 1559   BILIRUBINUR NEGATIVE 11/06/2009 1559   KETONESUR NEGATIVE 11/06/2009 1559   PROTEINUR NEGATIVE 11/06/2009 1559   UROBILINOGEN 0.2 11/06/2009 1559   NITRITE NEGATIVE 11/06/2009 1559   LEUKOCYTESUR  11/06/2009 1559    NEGATIVE MICROSCOPIC NOT DONE ON URINES WITH NEGATIVE PROTEIN, BLOOD, LEUKOCYTES, NITRITE, OR GLUCOSE <1000 mg/dL.   Sepsis Labs: @LABRCNTIP (procalcitonin:4,lacticidven:4) ) Recent Results (from the past 240 hour(s))  SARS Coronavirus 2 by RT PCR (hospital order, performed in [facility] hospital lab) Nasopharyngeal Nasopharyngeal Swab     Status: None   Collection Time: 04/09/20 10:04 PM   Specimen: Nasopharyngeal Swab  Result Value Ref Range Status   SARS Coronavirus 2 NEGATIVE NEGATIVE Final    Comment: (NOTE) SARS-CoV-2 target nucleic acids are NOT DETECTED.  The SARS-CoV-2 RNA is generally detectable in upper and lower respiratory specimens during the acute phase of infection. The  lowest concentration of SARS-CoV-2 viral copies this assay can detect is 250 copies / mL. A negative result does not preclude SARS-CoV-2 infection and should not be used as the sole basis for treatment or other patient management decisions.  A negative result may occur with improper specimen collection / handling, submission of specimen other than nasopharyngeal swab, presence of viral mutation(s) within the areas targeted by this assay, and inadequate number of viral copies (<250 copies / mL). A negative result must be combined with clinical observations, patient history, and epidemiological information.  Fact Sheet for Patients:   StrictlyIdeas.no  Fact Sheet for Healthcare Providers: BankingDealers.co.za  This test is not yet approved or  cleared by the [place] FDA and has been authorized for detection and/or diagnosis of SARS-CoV-2 by FDA  under an Emergency Use Authorization (EUA).  This EUA will remain in effect (meaning this test can be used) for the duration of the COVID-19 declaration under Section 564(b)(1) of the Act, 21 U.S.C. section 360bbb-3(b)(1), unless the authorization is terminated or revoked sooner.  Performed at [facility], [place] [address] [place], [place] [address]   Blood culture (routine x 2)     Status: None (Preliminary result)   Collection Time: 04/10/20 12:00 AM   Specimen: BLOOD  Result Value Ref Range Status   Specimen Description   Final    BLOOD RIGHT ANTECUBITAL Performed at [facility], [place] [address], [place], [place] [address]    Special Requests   Final    BOTTLES DRAWN AEROBIC AND ANAEROBIC Blood Culture adequate volume Performed at [place] [address] [place], [place] [address]    Culture PENDING  Incomplete   Report Status PENDING  Incomplete     Radiological Exams on Admission: DG Chest 2 View  Result Date: 04/09/2020 CLINICAL DATA:  Shortness of breath EXAM: CHEST - 2 VIEW COMPARISON:  April 28, 2012 FINDINGS: The heart size and mediastinal contours are within normal limits. Focal ill-defined nodular opacity seen within the left upper lung and right lung base. There is diffusely increased interstitial markings seen throughout both lungs, left greater than right. There is hyperinflation of the lung zones with flattening of the hemidiaphragms. Biapical scarring is noted. No acute osseous abnormality. IMPRESSION: Ill-defined nodular opacities in the periphery of the left upper lung and right lung base which are nonspecific could be areas of focal scarring versus pulmonary nodules given the patient's history. Diffusely increased interstitial markings, left greater than right which could be due to infectious or inflammatory process. Findings of COPD. Electronically Signed   By: Prudencio Pair M.D.   On: 04/09/2020 21:55   CT Angio  Chest PE W and/or Wo Contrast  Result Date: 04/09/2020 CLINICAL DATA:  Shortness of breath history of COPD and lung cancer EXAM: CT ANGIOGRAPHY CHEST WITH CONTRAST TECHNIQUE: Multidetector CT imaging of the chest was performed using the standard protocol during bolus administration of intravenous contrast. Multiplanar CT image reconstructions and MIPs were obtained to evaluate the vascular anatomy. CONTRAST:  39mL OMNIPAQUE IOHEXOL 350 MG/ML SOLN COMPARISON:  Radiograph same day FINDINGS: Cardiovascular: There is a optimal opacification of the pulmonary arteries. There is no central,segmental, or subsegmental filling defects within the pulmonary arteries. The heart is normal in size. No pericardial effusion or thickening. No evidence right heart strain. There is normal three-vessel brachiocephalic anatomy without proximal stenosis. Coronary artery calcifications are seen. Scattered aortic atherosclerosis is noted. Mediastinum/Nodes: No hilar, mediastinal, or axillary adenopathy. Thyroid  gland, trachea, and esophagus demonstrate no significant findings. Lungs/Pleura: The patient is status post left upper lobectomy. Extensive centrilobular emphysematous changes are seen at both lung apices with biapical scarring. There is multifocal patchy airspace opacities seen throughout the posterior left lower lung with areas of bronchial wall thickening. There is also multifocal hazy ground-glass patchy airspace opacities within the right middle lobe and posterior left lung base. No pleural effusion is seen. Upper Abdomen: No acute abnormalities present in the visualized portions of the upper abdomen. Musculoskeletal: No chest wall abnormality. No acute or significant osseous findings. Review of the MIP images confirms the above findings. IMPRESSION: 1.  No central, segmental, or subsegmental pulmonary embolism. 2. Multifocal patchy airspace opacities seen throughout the left lower lung, right middle lobe and posterior right  lower lung. This could be due to multifocal pneumonia. Would recommend however short interval follow-up upon treatment to include underlying neoplasm given the patient's history. 3.  Emphysema (ICD10-J43.9). 4.  Aortic Atherosclerosis (ICD10-I70.0). Electronically Signed   By: Prudencio Pair M.D.   On: 04/09/2020 23:46     Assessment/Plan Principal Problem:   Acute respiratory failure with hypoxia (HCC) Active Problems:   GERD (gastroesophageal reflux disease)   Hypothyroidism (acquired)   CAP (community acquired pneumonia)    1. Acute respiratory failure with hypoxia secondary to multifocal pneumonia -Covid test is negative we will check respiratory viral panel continue with empiric antibiotics for community-acquired pneumonia check sputum culture urine for Legionella and strep antigen and follow cultures. 2. COPD not actively wheezing at the time of my exam we will continue nebulizer and Pulmicort. 3. Postoperative hypothyroidism on Synthroid. 4. Ongoing tobacco abuse advised about quitting. 5. Leukocytosis likely from pneumonia.  Follow CBC. 6. History of lung cancer status post chemo 2 years ago.  Will need close follow-up with repeat CAT scan in few weeks to document resolution of pneumonia. 7. History of right-sided hydronephrosis with stents being followed by urologist at [facility]. 8. Sleep apnea on CPAP at bedtime.   DVT prophylaxis: Lovenox. Code Status: Full code. Family Communication: Discussed with patient. Disposition Plan: Home. Consults called: None. Admission status: Observation.   Rise Patience MD [facility] Pager [phone].  If 7PM-7AM, please contact night-coverage [email or web address] Password [facility]  04/10/2020, 3:43 AM

## 2020-04-10 NOTE — ED Notes (Signed)
Report given to floor RN

## 2020-04-10 NOTE — ED Notes (Signed)
Attempted to call report at time per purple man, RN stated she was with another patient and she would call back.

## 2020-04-10 NOTE — ED Provider Notes (Signed)
  Physical Exam  BP (!) 125/98 (BP Location: Right Arm)   Pulse 103   Temp 98 F (36.7 C) (Oral)   Resp 18   Ht 5\' 3"  (1.6 m)   Wt (!) 40.8 kg   SpO2 91%   BMI 15.94 kg/m   Physical Exam  ED Course/Procedures     Procedures  MDM  Care assumed at 11 PM from Dr. Tyrone Nine. Patient is here with shortness of breath.  Patient was noted to be hypoxic 89 to 90%.  Patient's white blood cell count is elevated 22.  Chest x-ray showed possible pneumonia.  Given she had recent travel, CTA was ordered.  Patient will need admission for hypoxia.  12:04 AM CTA showed multifocal pneumonia but no PE.  Given Rocephin and azithromycin.  Covid test ordered.  Lactate and cultures ordered.  Patient to be admitted.      Drenda Freeze, MD 04/10/20 [phone]

## 2020-04-10 NOTE — ED Triage Notes (Signed)
Patient complaining of sob. Patient has a hx of emphysema. Patient stills smokes. Patient wears oxygen at home. Patient came from home and daughter will pick her up

## 2020-04-10 NOTE — ED Notes (Signed)
Given lunch tray.

## 2020-04-10 NOTE — Care Plan (Signed)
Tracy Park is a 64 y.o. female with history of COPD with ongoing tobacco abuse, sleep apnea, lung cancer status post chemotherapy 2 years ago being followed by a pulmonologist, thyroidectomy with Synthroid presents to the ER after patient has been having persistent shortness of breath with cough over the last 1 week.  Patient recently traveled out of the state and returned last week.  Since returning patient has been having persistent cough initially productive presently nonproductive.  Has some chest pressure on coughing.  Otherwise chest pain-free.  Denies fever chills. In the ER patient was mildly hypoxic requiring 2 L oxygen.    CT chest consistent with multifocal pneumonia,  Covid test was negative.  Patient has been admitted for acute respiratory failure with hypoxia secondary to multifocal pneumonia, started on empiric antibiotics, continue Solu-Medrol and DuoNeb for COPD, Patient follows up with oncologist and urologist at [facility].   Patient has been seen and examined at bedside.  She reports feeling much better with oxygen and treatment.

## 2020-04-11 DIAGNOSIS — Z79890 Hormone replacement therapy (postmenopausal): Secondary | ICD-10-CM | POA: Diagnosis not present

## 2020-04-11 DIAGNOSIS — Z681 Body mass index (BMI) 19 or less, adult: Secondary | ICD-10-CM | POA: Diagnosis not present

## 2020-04-11 DIAGNOSIS — F17210 Nicotine dependence, cigarettes, uncomplicated: Secondary | ICD-10-CM | POA: Diagnosis present

## 2020-04-11 DIAGNOSIS — J189 Pneumonia, unspecified organism: Principal | ICD-10-CM

## 2020-04-11 DIAGNOSIS — Z888 Allergy status to other drugs, medicaments and biological substances status: Secondary | ICD-10-CM | POA: Diagnosis not present

## 2020-04-11 DIAGNOSIS — J440 Chronic obstructive pulmonary disease with acute lower respiratory infection: Secondary | ICD-10-CM | POA: Diagnosis present

## 2020-04-11 DIAGNOSIS — Z79899 Other long term (current) drug therapy: Secondary | ICD-10-CM | POA: Diagnosis not present

## 2020-04-11 DIAGNOSIS — Z886 Allergy status to analgesic agent status: Secondary | ICD-10-CM | POA: Diagnosis not present

## 2020-04-11 DIAGNOSIS — J9601 Acute respiratory failure with hypoxia: Secondary | ICD-10-CM | POA: Diagnosis not present

## 2020-04-11 DIAGNOSIS — G4730 Sleep apnea, unspecified: Secondary | ICD-10-CM | POA: Diagnosis present

## 2020-04-11 DIAGNOSIS — Z85118 Personal history of other malignant neoplasm of bronchus and lung: Secondary | ICD-10-CM | POA: Diagnosis not present

## 2020-04-11 DIAGNOSIS — R0902 Hypoxemia: Secondary | ICD-10-CM | POA: Diagnosis present

## 2020-04-11 DIAGNOSIS — K219 Gastro-esophageal reflux disease without esophagitis: Secondary | ICD-10-CM | POA: Diagnosis present

## 2020-04-11 DIAGNOSIS — Z9221 Personal history of antineoplastic chemotherapy: Secondary | ICD-10-CM | POA: Diagnosis not present

## 2020-04-11 DIAGNOSIS — E890 Postprocedural hypothyroidism: Secondary | ICD-10-CM | POA: Diagnosis present

## 2020-04-11 DIAGNOSIS — R64 Cachexia: Secondary | ICD-10-CM | POA: Diagnosis present

## 2020-04-11 DIAGNOSIS — Z20822 Contact with and (suspected) exposure to covid-19: Secondary | ICD-10-CM | POA: Diagnosis present

## 2020-04-11 LAB — MAGNESIUM: Magnesium: 2 mg/dL (ref 1.7–2.4)

## 2020-04-11 LAB — BASIC METABOLIC PANEL
Anion gap: 9 (ref 5–15)
BUN: 24 mg/dL — ABNORMAL HIGH (ref 8–23)
CO2: 29 mmol/L (ref 22–32)
Calcium: 9.2 mg/dL (ref 8.9–10.3)
Chloride: 101 mmol/L (ref 98–111)
Creatinine, Ser: 0.63 mg/dL (ref 0.44–1.00)
GFR calc Af Amer: >60 mL/min (ref >60)
GFR calc non Af Amer: >60 mL/min (ref >60)
Glucose, Bld: 108 mg/dL — ABNORMAL HIGH (ref 70–99)
Potassium: 3.3 mmol/L — ABNORMAL LOW (ref 3.5–5.1)
Sodium: 139 mmol/L (ref 135–145)

## 2020-04-11 LAB — CBC
HCT: 32.8 % — ABNORMAL LOW (ref 36.0–46.0)
Hemoglobin: 10.9 g/dL — ABNORMAL LOW (ref 12.0–15.0)
MCH: 29.9 pg (ref 26.0–34.0)
MCHC: 33.2 g/dL (ref 30.0–36.0)
MCV: 90.1 fL (ref 80.0–100.0)
Platelets: 418 10*3/uL — ABNORMAL HIGH (ref 150–400)
RBC: 3.64 MIL/uL — ABNORMAL LOW (ref 3.87–5.11)
RDW: 13.8 % (ref 11.5–15.5)
WBC: 18.8 10*3/uL — ABNORMAL HIGH (ref 4.0–10.5)
nRBC: 0 % (ref 0.0–0.2)

## 2020-04-11 LAB — PHOSPHORUS: Phosphorus: 2.5 mg/dL (ref 2.5–4.6)

## 2020-04-11 MED ORDER — LIP MEDEX EX OINT
1.0000 "application " | TOPICAL_OINTMENT | CUTANEOUS | Status: DC | PRN
  Filled 2020-04-11: qty 7

## 2020-04-11 NOTE — Hospital Course (Addendum)
Tracy Park is a 64 y.o. female with history of COPD with ongoing tobacco abuse, sleep apnea, lung cancer status post chemotherapy 2 years ago being followed by a pulmonologist, thyroidectomy who presented to the ER with SOB.  She was also having significant pain with inspiration and worsening coughing at home.  She underwent workup with a CTA chest which was negative for PE but notable for multifocal pneumonia in the LLL and RML/RLL. She was also hypoxic in the ER and placed on 2L [place] O2.  She continued to clinically improve during hospitalization and was able to be weaned off of oxygen down to room air.  She was considered stable for discharging home with oral antibiotics to complete her course.

## 2020-04-11 NOTE — Assessment & Plan Note (Addendum)
-   considered 2/2 bilateral PNA - CTA chest negative for PE - continue O2 and wean as able.  Was able to be weaned to room air prior to discharge

## 2020-04-11 NOTE — Progress Notes (Signed)
PROGRESS NOTE    Tracy Park   JJO:[id number]  DOB: 02/08/1956  DOA: 04/09/2020     0  PCP: System, Provider Not In  CC: SOB, cough  Hospital Course: Tracy Park is a 64 y.o. female with history of COPD with ongoing tobacco abuse, sleep apnea, lung cancer status post chemotherapy 2 years ago being followed by a pulmonologist, thyroidectomy who presented to the ER with SOB.  She was also having significant pain with inspiration and worsening coughing at home.  She underwent workup with a CTA chest which was negative for PE but notable for multifocal pneumonia in the LLL and RML/RLL. She was also hypoxic in the ER and placed on 2L [place] O2.   Interval History:  Admitted yesterday with worsening pain with breathing as well as cough at home. She also continues to smoke about 2 cigarettes a day she says. She still feels achy and SOB this am.   Old records reviewed in assessment of this patient  ROS: Constitutional: positive for fatigue, Respiratory: positive for cough and dyspnea on exertion, Cardiovascular: negative for chest pain and Gastrointestinal: negative for abdominal pain  Assessment & Plan: Hypothyroidism (acquired) - continue synthroid   Acute respiratory failure with hypoxia ([place]) - considered 2/2 bilateral PNA - CTA chest negative for PE - continue O2 and wean as able - ambulate in am on RA  GERD (gastroesophageal reflux disease) - continue PPI  CAP (community acquired pneumonia) - continue rocephin and azithro  - weaning O2 as able - CURB-65 = 0 to 1 (patient endorsed more tachypnea than possibly documented) but suspect she should be able to d/c home tomorrow if off O2 - will de-escalate to PO abx tomorrow   Nicotine abuse - continue nicotine patch as needed   Antimicrobials: Azithromycin 04/10/20 >> present Rocephin 04/10/20>> present  DVT prophylaxis: SCD Code Status: Full Family Communication: none present Disposition Plan:  Status is:  Inpatient  Remains inpatient appropriate because:Ongoing active pain requiring inpatient pain management, IV treatments appropriate due to intensity of illness or inability to take PO and Inpatient level of care appropriate due to severity of illness   Dispo: The patient is from: Home              Anticipated d/c is to: Home              Anticipated d/c date is: 1 day              Patient currently is not medically stable to d/c.       Objective: Blood pressure 119/68, pulse 89, temperature 98.4 F (36.9 C), temperature source Oral, resp. rate 17, height 5\' 3"  (1.6 m), weight (!) 40.8 kg, SpO2 98 %.  Examination: General appearance: alert, cooperative and no distress Head: Normocephalic, without obvious abnormality, atraumatic Eyes: EOMI Lungs: coarse breath sounds bilaterally, no wheezing appreciated Heart: regular rate and rhythm and S1, S2 normal Abdomen: normal findings: bowel sounds normal and soft, non-tender Extremities: no edema Skin: mobility and turgor normal Neurologic: Grossly normal   Consultants:   n/a  Procedures:   n/a  Data Reviewed: I have personally reviewed following labs and imaging studies Results for orders placed or performed during the hospital encounter of 04/09/20 (from the past 24 hour(s))  CBC     Status: Abnormal   Collection Time: 04/11/20  3:51 AM  Result Value Ref Range   WBC 18.8 (H) 4.0 - 10.5 K/uL   RBC 3.64 (L) 3.87 -  5.11 MIL/uL   Hemoglobin 10.9 (L) 12.0 - 15.0 g/dL   HCT 32.8 (L) 36 - 46 %   MCV 90.1 80.0 - 100.0 fL   MCH 29.9 26.0 - 34.0 pg   MCHC 33.2 30.0 - 36.0 g/dL   RDW 13.8 11.5 - 15.5 %   Platelets 418 (H) 150 - 400 K/uL   nRBC 0.0 0.0 - 0.2 %  Basic metabolic panel     Status: Abnormal   Collection Time: 04/11/20  3:51 AM  Result Value Ref Range   Sodium 139 135 - 145 mmol/L   Potassium 3.3 (L) 3.5 - 5.1 mmol/L   Chloride 101 98 - 111 mmol/L   CO2 29 22 - 32 mmol/L   Glucose, Bld 108 (H) 70 - 99 mg/dL   BUN  24 (H) 8 - 23 mg/dL   Creatinine, Ser 0.63 0.44 - 1.00 mg/dL   Calcium 9.2 8.9 - 10.3 mg/dL   GFR calc non Af Amer >60 >60 mL/min   GFR calc Af Amer >60 >60 mL/min   Anion gap 9 5 - 15  Magnesium     Status: None   Collection Time: 04/11/20  3:51 AM  Result Value Ref Range   Magnesium 2.0 1.7 - 2.4 mg/dL  Phosphorus     Status: None   Collection Time: 04/11/20  3:51 AM  Result Value Ref Range   Phosphorus 2.5 2.5 - 4.6 mg/dL    Recent Results (from the past 240 hour(s))  SARS Coronavirus 2 by RT PCR (hospital order, performed in [place] hospital lab) Nasopharyngeal Nasopharyngeal Swab     Status: None   Collection Time: 04/09/20 10:04 PM   Specimen: Nasopharyngeal Swab  Result Value Ref Range Status   SARS Coronavirus 2 NEGATIVE NEGATIVE Final    Comment: (NOTE) SARS-CoV-2 target nucleic acids are NOT DETECTED.  The SARS-CoV-2 RNA is generally detectable in upper and lower respiratory specimens during the acute phase of infection. The lowest concentration of SARS-CoV-2 viral copies this assay can detect is 250 copies / mL. A negative result does not preclude SARS-CoV-2 infection and should not be used as the sole basis for treatment or other patient management decisions.  A negative result may occur with improper specimen collection / handling, submission of specimen other than nasopharyngeal swab, presence of viral mutation(s) within the areas targeted by this assay, and inadequate number of viral copies (<250 copies / mL). A negative result must be combined with clinical observations, patient history, and epidemiological information.  Fact Sheet for Patients:   StrictlyIdeas.no  Fact Sheet for Healthcare Providers: BankingDealers.co.za  This test is not yet approved or  cleared by the [place] FDA and has been authorized for detection and/or diagnosis of SARS-CoV-2 by FDA under an Emergency Use Authorization (EUA).   This EUA will remain in effect (meaning this test can be used) for the duration of the COVID-19 declaration under Section 564(b)(1) of the Act, 21 U.S.C. section 360bbb-3(b)(1), unless the authorization is terminated or revoked sooner.  Performed at [facility], [place] [address] [place], [place] [address]   Blood culture (routine x 2)     Status: None (Preliminary result)   Collection Time: 04/10/20 12:00 AM   Specimen: BLOOD  Result Value Ref Range Status   Specimen Description   Final    BLOOD RIGHT ANTECUBITAL Performed at [facility], [place] [address] [place], [place] [address]    Special Requests   Final  BOTTLES DRAWN AEROBIC AND ANAEROBIC Blood Culture adequate volume Performed at [place] [address] [place], [place] [address]    Culture PENDING  Incomplete   Report Status PENDING  Incomplete     Radiology Studies: DG Chest 2 View  Result Date: 04/09/2020 CLINICAL DATA:  Shortness of breath EXAM: CHEST - 2 VIEW COMPARISON:  April 28, 2012 FINDINGS: The heart size and mediastinal contours are within normal limits. Focal ill-defined nodular opacity seen within the left upper lung and right lung base. There is diffusely increased interstitial markings seen throughout both lungs, left greater than right. There is hyperinflation of the lung zones with flattening of the hemidiaphragms. Biapical scarring is noted. No acute osseous abnormality. IMPRESSION: Ill-defined nodular opacities in the periphery of the left upper lung and right lung base which are nonspecific could be areas of focal scarring versus pulmonary nodules given the patient's history. Diffusely increased interstitial markings, left greater than right which could be due to infectious or inflammatory process. Findings of COPD. Electronically Signed   By: Prudencio Pair M.D.   On: 04/09/2020 21:55   CT Angio Chest PE W and/or Wo Contrast  Result Date:  04/09/2020 CLINICAL DATA:  Shortness of breath history of COPD and lung cancer EXAM: CT ANGIOGRAPHY CHEST WITH CONTRAST TECHNIQUE: Multidetector CT imaging of the chest was performed using the standard protocol during bolus administration of intravenous contrast. Multiplanar CT image reconstructions and MIPs were obtained to evaluate the vascular anatomy. CONTRAST:  8mL OMNIPAQUE IOHEXOL 350 MG/ML SOLN COMPARISON:  Radiograph same day FINDINGS: Cardiovascular: There is a optimal opacification of the pulmonary arteries. There is no central,segmental, or subsegmental filling defects within the pulmonary arteries. The heart is normal in size. No pericardial effusion or thickening. No evidence right heart strain. There is normal three-vessel brachiocephalic anatomy without proximal stenosis. Coronary artery calcifications are seen. Scattered aortic atherosclerosis is noted. Mediastinum/Nodes: No hilar, mediastinal, or axillary adenopathy. Thyroid gland, trachea, and esophagus demonstrate no significant findings. Lungs/Pleura: The patient is status post left upper lobectomy. Extensive centrilobular emphysematous changes are seen at both lung apices with biapical scarring. There is multifocal patchy airspace opacities seen throughout the posterior left lower lung with areas of bronchial wall thickening. There is also multifocal hazy ground-glass patchy airspace opacities within the right middle lobe and posterior left lung base. No pleural effusion is seen. Upper Abdomen: No acute abnormalities present in the visualized portions of the upper abdomen. Musculoskeletal: No chest wall abnormality. No acute or significant osseous findings. Review of the MIP images confirms the above findings. IMPRESSION: 1.  No central, segmental, or subsegmental pulmonary embolism. 2. Multifocal patchy airspace opacities seen throughout the left lower lung, right middle lobe and posterior right lower lung. This could be due to multifocal  pneumonia. Would recommend however short interval follow-up upon treatment to include underlying neoplasm given the patient's history. 3.  Emphysema (ICD10-J43.9). 4.  Aortic Atherosclerosis (ICD10-I70.0). Electronically Signed   By: Prudencio Pair M.D.   On: 04/09/2020 23:46   CT Angio Chest PE W and/or Wo Contrast  Final Result    DG Chest 2 View  Final Result       Scheduled Meds: . budesonide (PULMICORT) nebulizer solution  0.25 mg Nebulization BID  . buPROPion  300 mg Oral QPC breakfast  . ipratropium-albuterol  3 mL Nebulization BID  . levothyroxine  137 mcg Oral QAC breakfast  . oxybutynin  5 mg Oral TID  . pantoprazole  40 mg Oral  Daily  . prazosin  2 mg Oral QHS  . QUEtiapine  200 mg Oral QHS  . rosuvastatin  20 mg Oral q1800  . tamsulosin  0.4 mg Oral Daily   PRN Meds: albuterol, famotidine, lip balm, methocarbamol, ondansetron **OR** ondansetron (ZOFRAN) IV Continuous Infusions: . azithromycin 500 mg (04/11/20 0600)  . cefTRIAXone (ROCEPHIN)  IV 2 g (04/11/20 0557)      LOS: 0 days  Time spent: Greater than 50% of the 35 minute visit was spent in counseling/coordination of care for the patient as laid out in the A&P.   Dwyane Dee, MD [facility] 04/11/2020, 3:07 PM   Contact via secure chat.  To contact the attending provider between 7A-7P or the covering provider during after hours 7P-7A, please log into the web site [email or web address] and access using universal [place] password for that web site. If you do not have the password, please call the hospital operator.

## 2020-04-11 NOTE — Assessment & Plan Note (Signed)
-  continue synthroid 

## 2020-04-11 NOTE — Assessment & Plan Note (Signed)
>>  ASSESSMENT AND PLAN FOR NICOTINE  ABUSE WRITTEN ON 04/11/2020  3:07 PM BY Ivoree Homes, MD  - continue nicotine  patch as needed

## 2020-04-11 NOTE — Assessment & Plan Note (Signed)
continue PPI

## 2020-04-11 NOTE — Assessment & Plan Note (Addendum)
-   continue rocephin and azithro during hospitalization - weaning O2 as able.  Weaned down to room air prior to discharge - CURB-65 = 0 to 1 (patient endorsed more tachypnea than possibly documented) -Deescalated to amoxicillin and doxycycline at discharge to complete course

## 2020-04-11 NOTE — Assessment & Plan Note (Signed)
-   continue nicotine patch as needed

## 2020-04-12 LAB — MAGNESIUM: Magnesium: 1.7 mg/dL (ref 1.7–2.4)

## 2020-04-12 LAB — CBC WITH DIFFERENTIAL/PLATELET
Abs Immature Granulocytes: 0.11 10*3/uL — ABNORMAL HIGH (ref 0.00–0.07)
Basophils Absolute: 0 10*3/uL (ref 0.0–0.1)
Basophils Relative: 0 %
Eosinophils Absolute: 0.1 10*3/uL (ref 0.0–0.5)
Eosinophils Relative: 1 %
HCT: 31 % — ABNORMAL LOW (ref 36.0–46.0)
Hemoglobin: 10.1 g/dL — ABNORMAL LOW (ref 12.0–15.0)
Immature Granulocytes: 1 %
Lymphocytes Relative: 24 %
Lymphs Abs: 2.4 10*3/uL (ref 0.7–4.0)
MCH: 29.6 pg (ref 26.0–34.0)
MCHC: 32.6 g/dL (ref 30.0–36.0)
MCV: 90.9 fL (ref 80.0–100.0)
Monocytes Absolute: 0.6 10*3/uL (ref 0.1–1.0)
Monocytes Relative: 6 %
Neutro Abs: 6.8 10*3/uL (ref 1.7–7.7)
Neutrophils Relative %: 68 %
Platelets: 399 10*3/uL (ref 150–400)
RBC: 3.41 MIL/uL — ABNORMAL LOW (ref 3.87–5.11)
RDW: 14 % (ref 11.5–15.5)
WBC: 9.9 10*3/uL (ref 4.0–10.5)
nRBC: 0 % (ref 0.0–0.2)

## 2020-04-12 LAB — BASIC METABOLIC PANEL
Anion gap: 10 (ref 5–15)
BUN: 16 mg/dL (ref 8–23)
CO2: 28 mmol/L (ref 22–32)
Calcium: 8.8 mg/dL — ABNORMAL LOW (ref 8.9–10.3)
Chloride: 102 mmol/L (ref 98–111)
Creatinine, Ser: 0.66 mg/dL (ref 0.44–1.00)
GFR calc Af Amer: >60 mL/min (ref >60)
GFR calc non Af Amer: >60 mL/min (ref >60)
Glucose, Bld: 100 mg/dL — ABNORMAL HIGH (ref 70–99)
Potassium: 3.5 mmol/L (ref 3.5–5.1)
Sodium: 140 mmol/L (ref 135–145)

## 2020-04-12 MED ORDER — DOXYCYCLINE MONOHYDRATE 100 MG PO TABS: 100.0000 mg | ORAL_TABLET | Freq: Two times a day (BID) | ORAL | 0 refills | Status: AC

## 2020-04-12 MED ORDER — AMOXICILLIN 875 MG PO TABS
875.0000 mg | ORAL_TABLET | Freq: Two times a day (BID) | ORAL | 0 refills | Status: AC
Start: 2020-04-13 — End: 2020-04-15

## 2020-04-12 MED ORDER — HYDROCODONE-HOMATROPINE 5-1.5 MG/5ML PO SYRP: 5.0000 mL | ORAL_SOLUTION | Freq: Four times a day (QID) | ORAL | 0 refills | Status: DC | PRN

## 2020-04-12 NOTE — Discharge Summary (Signed)
Physician Discharge Summary  SAYRE MAZOR KXF:[id number] DOB: 1956/09/02 DOA: 04/09/2020  PCP: System, Provider Not In  Admit date: 04/09/2020 Discharge date: 04/12/2020  Admitted From: home Disposition:  home Discharging physician: Dwyane Dee, MD  Recommendations for Outpatient Follow-up:  1. Follow up cessation from smoking  Patient discharged to Home in Discharge Condition: stable CODE STATUS: Full Diet recommendation:  Diet Orders (From admission, onward)    Start     Ordered   04/12/20 0000  Diet general        04/12/20 1426   04/10/20 1617  Diet Heart Room service appropriate? Yes; Fluid consistency: Thin  Diet effective now       Comments: Soft meats  Question Answer Comment  Room service appropriate? Yes   Fluid consistency: Thin      04/10/20 1616          Hospital Course: Tracy Park is a 64 y.o. female with history of COPD with ongoing tobacco abuse, sleep apnea, lung cancer status post chemotherapy 2 years ago being followed by a pulmonologist, thyroidectomy who presented to the ER with SOB.  She was also having significant pain with inspiration and worsening coughing at home.  She underwent workup with a CTA chest which was negative for PE but notable for multifocal pneumonia in the LLL and RML/RLL. She was also hypoxic in the ER and placed on 2L [place] O2.  She continued to clinically improve during hospitalization and was able to be weaned off of oxygen down to room air.  She was considered stable for discharging home with oral antibiotics to complete her course.   Hypothyroidism (acquired) - continue synthroid   Acute respiratory failure with hypoxia ([place]) - considered 2/2 bilateral PNA - CTA chest negative for PE - continue O2 and wean as able.  Was able to be weaned to room air prior to discharge  GERD (gastroesophageal reflux disease) - continue PPI  CAP (community acquired pneumonia) - continue rocephin and azithro during hospitalization -  weaning O2 as able.  Weaned down to room air prior to discharge - CURB-65 = 0 to 1 (patient endorsed more tachypnea than possibly documented) -Deescalated to amoxicillin and doxycycline at discharge to complete course  Nicotine abuse - continue nicotine patch as needed    The patient's chronic medical conditions were treated accordingly per the patient's home medication regimen except as noted.  On day of discharge, patient was felt deemed stable for discharge. Patient/family member advised to call PCP or come back to ER if needed.   Discharge Diagnoses:   Principal Diagnosis: Acute respiratory failure with hypoxia [facility])  Active Hospital Problems   Diagnosis Date Noted  . CAP (community acquired pneumonia) 04/10/2020    Priority: High  . Hypothyroidism (acquired) 04/28/2012  . GERD (gastroesophageal reflux disease) 04/28/2012  . Nicotine abuse 04/28/2012    Resolved Hospital Problems   Diagnosis Date Noted Date Resolved  . Acute respiratory failure with hypoxia ([place]) 04/10/2020 04/12/2020    Priority: High    Discharge Instructions    Diet general   Complete by: As directed    Increase activity slowly   Complete by: As directed      Allergies as of 04/12/2020      Reactions   Red Dye Hives, Itching   Aspirin Hives   Tape Rash      Medication List    TAKE these medications   albuterol 108 (90 Base) MCG/ACT inhaler Commonly known as: VENTOLIN HFA Inhale 2  puffs into the lungs every 6 (six) hours as needed for wheezing or shortness of breath.   amoxicillin 875 MG tablet Commonly known as: AMOXIL Take 1 tablet (875 mg total) by mouth 2 (two) times daily for 2 days. Start taking on: April 13, 2020   buPROPion 300 MG 24 hr tablet Commonly known as: WELLBUTRIN XL Take 300 mg by mouth daily after breakfast.   doxycycline 100 MG tablet Commonly known as: ADOXA Take 1 tablet (100 mg total) by mouth 2 (two) times daily for 2 days. Start taking on: April 13, 2020    famotidine 40 MG tablet Commonly known as: PEPCID Take 40 mg by mouth daily as needed for heartburn or indigestion.   HYDROcodone-homatropine 5-1.5 MG/5ML syrup Commonly known as: Hycodan Take 5 mLs by mouth every 6 (six) hours as needed for cough.   levothyroxine 137 MCG tablet Commonly known as: SYNTHROID Take 137 mcg by mouth daily before breakfast.   meloxicam 15 MG tablet Commonly known as: MOBIC Take 15 mg by mouth daily.   methocarbamol 500 MG tablet Commonly known as: ROBAXIN Take 500 mg by mouth every 12 (twelve) hours as needed for muscle spasms.   omeprazole 20 MG capsule Commonly known as: PRILOSEC Take 40 mg by mouth daily.   ondansetron 4 MG tablet Commonly known as: ZOFRAN Take 4 mg by mouth every 8 (eight) hours as needed for nausea or vomiting.   oxybutynin 5 MG tablet Commonly known as: DITROPAN Take 5 mg by mouth 3 (three) times daily.   prazosin 2 MG capsule Commonly known as: MINIPRESS Take 2 mg by mouth at bedtime.   QUEtiapine 200 MG tablet Commonly known as: SEROQUEL Take 200 mg by mouth at bedtime.   rosuvastatin 20 MG tablet Commonly known as: CRESTOR Take 20 mg by mouth daily.   tamsulosin 0.4 MG Caps capsule Commonly known as: FLOMAX Take 0.4 mg by mouth daily.       Allergies  Allergen Reactions  . Red Dye Hives and Itching  . Aspirin Hives  . Tape Rash   Discharge Exam: BP (!) 115/95 (BP Location: Left Arm)   Pulse 86   Temp 98.1 F (36.7 C)   Resp 16   Ht 5\' 3"  (1.6 m)   Wt (!) 40.8 kg   SpO2 92%   BMI 15.94 kg/m  General appearance: alert, cooperative and no distress Head: Normocephalic, without obvious abnormality, atraumatic Eyes: EOMI Lungs: coarse breath sounds bilaterally, no wheezing appreciated Heart: regular rate and rhythm and S1, S2 normal Abdomen: normal findings: bowel sounds normal and soft, non-tender Extremities: no edema Skin: mobility and turgor normal Neurologic: Grossly normal   The  results of significant diagnostics from this hospitalization (including imaging, microbiology, ancillary and laboratory) are listed below for reference.   Microbiology: Recent Results (from the past 240 hour(s))  SARS Coronavirus 2 by RT PCR (hospital order, performed in [facility] hospital lab) Nasopharyngeal Nasopharyngeal Swab     Status: None   Collection Time: 04/09/20 10:04 PM   Specimen: Nasopharyngeal Swab  Result Value Ref Range Status   SARS Coronavirus 2 NEGATIVE NEGATIVE Final    Comment: (NOTE) SARS-CoV-2 target nucleic acids are NOT DETECTED.  The SARS-CoV-2 RNA is generally detectable in upper and lower respiratory specimens during the acute phase of infection. The lowest concentration of SARS-CoV-2 viral copies this assay can detect is 250 copies / mL. A negative result does not preclude SARS-CoV-2 infection and should not be used as the  sole basis for treatment or other patient management decisions.  A negative result may occur with improper specimen collection / handling, submission of specimen other than nasopharyngeal swab, presence of viral mutation(s) within the areas targeted by this assay, and inadequate number of viral copies (<250 copies / mL). A negative result must be combined with clinical observations, patient history, and epidemiological information.  Fact Sheet for Patients:   StrictlyIdeas.no  Fact Sheet for Healthcare Providers: BankingDealers.co.za  This test is not yet approved or  cleared by the [place] FDA and has been authorized for detection and/or diagnosis of SARS-CoV-2 by FDA under an Emergency Use Authorization (EUA).  This EUA will remain in effect (meaning this test can be used) for the duration of the COVID-19 declaration under Section 564(b)(1) of the Act, 21 U.S.C. section 360bbb-3(b)(1), unless the authorization is terminated or revoked sooner.  Performed at [facility], [place] [address] [place], [place] [address]   Blood culture (routine x 2)     Status: None (Preliminary result)   Collection Time: 04/10/20 12:00 AM   Specimen: BLOOD  Result Value Ref Range Status   Specimen Description   Final    BLOOD RIGHT ANTECUBITAL Performed at [facility], [place] [address] [place], [place] [address]    Special Requests   Final    BOTTLES DRAWN AEROBIC AND ANAEROBIC Blood Culture adequate volume Performed at [facility] [address], [place], [place] [address]    Culture   Final    NO GROWTH 2 DAYS Performed at [facility] [address] [place], [place] [address]    Report Status PENDING  Incomplete     Labs: BNP (last 3 results) Recent Labs    04/09/20 2217  BNP 785.8*   Basic Metabolic Panel: Recent Labs  Lab 04/09/20 2217 04/09/20 2226 04/10/20 0413 04/11/20 0351 04/12/20 0332  NA 141  --  141 139 140  K 3.3*  --  3.6 3.3* 3.5  CL 102  --  99 101 102  CO2 24  --  26 29 28   GLUCOSE 108*  --  108* 108* 100*  BUN 19  --  21 24* 16  CREATININE 0.74 0.60 0.76 0.63 0.66  CALCIUM 9.3  --  9.3 9.2 8.8*  MG  --   --   --  2.0 1.7  PHOS  --   --   --  2.5  --    Liver Function Tests: No results for input(s): AST, ALT, ALKPHOS, BILITOT, PROT, ALBUMIN in the last 168 hours. No results for input(s): LIPASE, AMYLASE in the last 168 hours. No results for input(s): AMMONIA in the last 168 hours. CBC: Recent Labs  Lab 04/09/20 2217 04/10/20 0413 04/11/20 0351 04/12/20 0332  WBC 22.1* 18.6* 18.8* 9.9  NEUTROABS 19.4*  --   --  6.8  HGB 12.0 11.7* 10.9* 10.1*  HCT 36.1 35.6* 32.8* 31.0*  MCV 89.8 90.1 90.1 90.9  PLT 370 393 418* 399   Cardiac Enzymes: No results for input(s): CKTOTAL, CKMB, CKMBINDEX, TROPONINI in the last 168 hours. BNP: Invalid input(s): POCBNP CBG: No results for input(s): GLUCAP in the last 168 hours. D-Dimer No results for input(s): DDIMER in the last 72  hours. Hgb A1c No results for input(s): HGBA1C in the last 72 hours. Lipid Profile No results for input(s): CHOL, HDL, LDLCALC, TRIG, CHOLHDL, LDLDIRECT in the last 72 hours. Thyroid function studies No results for input(s):  TSH, T4TOTAL, T3FREE, THYROIDAB in the last 72 hours.  Invalid input(s): FREET3 Anemia work up No results for input(s): VITAMINB12, FOLATE, FERRITIN, TIBC, IRON, RETICCTPCT in the last 72 hours. Urinalysis    Component Value Date/Time   COLORURINE YELLOW 11/06/2009 1559   APPEARANCEUR CLEAR 11/06/2009 1559   LABSPEC 1.011 11/06/2009 1559   PHURINE 6.0 11/06/2009 1559   GLUCOSEU NEGATIVE 11/06/2009 1559   HGBUR NEGATIVE 11/06/2009 1559   BILIRUBINUR NEGATIVE 11/06/2009 1559   KETONESUR NEGATIVE 11/06/2009 1559   PROTEINUR NEGATIVE 11/06/2009 1559   UROBILINOGEN 0.2 11/06/2009 1559   NITRITE NEGATIVE 11/06/2009 1559   LEUKOCYTESUR  11/06/2009 1559    NEGATIVE MICROSCOPIC NOT DONE ON URINES WITH NEGATIVE PROTEIN, BLOOD, LEUKOCYTES, NITRITE, OR GLUCOSE <1000 mg/dL.   Sepsis Labs Invalid input(s): PROCALCITONIN,  WBC,  LACTICIDVEN Microbiology Recent Results (from the past 240 hour(s))  SARS Coronavirus 2 by RT PCR (hospital order, performed in [facility] hospital lab) Nasopharyngeal Nasopharyngeal Swab     Status: None   Collection Time: 04/09/20 10:04 PM   Specimen: Nasopharyngeal Swab  Result Value Ref Range Status   SARS Coronavirus 2 NEGATIVE NEGATIVE Final    Comment: (NOTE) SARS-CoV-2 target nucleic acids are NOT DETECTED.  The SARS-CoV-2 RNA is generally detectable in upper and lower respiratory specimens during the acute phase of infection. The lowest concentration of SARS-CoV-2 viral copies this assay can detect is 250 copies / mL. A negative result does not preclude SARS-CoV-2 infection and should not be used as the sole basis for treatment or other patient management decisions.  A negative result may occur with improper specimen collection /  handling, submission of specimen other than nasopharyngeal swab, presence of viral mutation(s) within the areas targeted by this assay, and inadequate number of viral copies (<250 copies / mL). A negative result must be combined with clinical observations, patient history, and epidemiological information.  Fact Sheet for Patients:   StrictlyIdeas.no  Fact Sheet for Healthcare Providers: BankingDealers.co.za  This test is not yet approved or  cleared by the [place] FDA and has been authorized for detection and/or diagnosis of SARS-CoV-2 by FDA under an Emergency Use Authorization (EUA).  This EUA will remain in effect (meaning this test can be used) for the duration of the COVID-19 declaration under Section 564(b)(1) of the Act, 21 U.S.C. section 360bbb-3(b)(1), unless the authorization is terminated or revoked sooner.  Performed at [facility], [place] [address] [place], [place] [address]   Blood culture (routine x 2)     Status: None (Preliminary result)   Collection Time: 04/10/20 12:00 AM   Specimen: BLOOD  Result Value Ref Range Status   Specimen Description   Final    BLOOD RIGHT ANTECUBITAL Performed at [facility], [place] [address] [place], [place] [address]    Special Requests   Final    BOTTLES DRAWN AEROBIC AND ANAEROBIC Blood Culture adequate volume Performed at [facility] [address] [place], [place] [address]    Culture   Final    NO GROWTH 2 DAYS Performed at [facility] [address] [place], [place] [address]    Report Status PENDING  Incomplete    Procedures/Studies: DG Chest 2 View  Result Date: 04/09/2020 CLINICAL DATA:  Shortness of breath EXAM: CHEST - 2 VIEW COMPARISON:  April 28, 2012 FINDINGS: The heart size and mediastinal contours are within normal limits. Focal ill-defined nodular opacity seen within the left upper lung and  right lung  base. There is diffusely increased interstitial markings seen throughout both lungs, left greater than right. There is hyperinflation of the lung zones with flattening of the hemidiaphragms. Biapical scarring is noted. No acute osseous abnormality. IMPRESSION: Ill-defined nodular opacities in the periphery of the left upper lung and right lung base which are nonspecific could be areas of focal scarring versus pulmonary nodules given the patient's history. Diffusely increased interstitial markings, left greater than right which could be due to infectious or inflammatory process. Findings of COPD. Electronically Signed   By: Prudencio Pair M.D.   On: 04/09/2020 21:55   CT Angio Chest PE W and/or Wo Contrast  Result Date: 04/09/2020 CLINICAL DATA:  Shortness of breath history of COPD and lung cancer EXAM: CT ANGIOGRAPHY CHEST WITH CONTRAST TECHNIQUE: Multidetector CT imaging of the chest was performed using the standard protocol during bolus administration of intravenous contrast. Multiplanar CT image reconstructions and MIPs were obtained to evaluate the vascular anatomy. CONTRAST:  69mL OMNIPAQUE IOHEXOL 350 MG/ML SOLN COMPARISON:  Radiograph same day FINDINGS: Cardiovascular: There is a optimal opacification of the pulmonary arteries. There is no central,segmental, or subsegmental filling defects within the pulmonary arteries. The heart is normal in size. No pericardial effusion or thickening. No evidence right heart strain. There is normal three-vessel brachiocephalic anatomy without proximal stenosis. Coronary artery calcifications are seen. Scattered aortic atherosclerosis is noted. Mediastinum/Nodes: No hilar, mediastinal, or axillary adenopathy. Thyroid gland, trachea, and esophagus demonstrate no significant findings. Lungs/Pleura: The patient is status post left upper lobectomy. Extensive centrilobular emphysematous changes are seen at both lung apices with biapical scarring. There is multifocal patchy  airspace opacities seen throughout the posterior left lower lung with areas of bronchial wall thickening. There is also multifocal hazy ground-glass patchy airspace opacities within the right middle lobe and posterior left lung base. No pleural effusion is seen. Upper Abdomen: No acute abnormalities present in the visualized portions of the upper abdomen. Musculoskeletal: No chest wall abnormality. No acute or significant osseous findings. Review of the MIP images confirms the above findings. IMPRESSION: 1.  No central, segmental, or subsegmental pulmonary embolism. 2. Multifocal patchy airspace opacities seen throughout the left lower lung, right middle lobe and posterior right lower lung. This could be due to multifocal pneumonia. Would recommend however short interval follow-up upon treatment to include underlying neoplasm given the patient's history. 3.  Emphysema (ICD10-J43.9). 4.  Aortic Atherosclerosis (ICD10-I70.0). Electronically Signed   By: Prudencio Pair M.D.   On: 04/09/2020 23:46     Time coordinating discharge: Over 30 minutes    Dwyane Dee, MD  [facility] 04/12/2020, 4:05 PM Pager: Secure chat  If 7PM-7AM, please contact night-coverage [email or web address] Password [email or web address]

## 2020-04-15 LAB — CULTURE, BLOOD (ROUTINE X 2)
Culture: NO GROWTH
Special Requests: ADEQUATE

## 2020-04-17 DIAGNOSIS — Z9089 Acquired absence of other organs: Secondary | ICD-10-CM | POA: Diagnosis not present

## 2020-04-17 DIAGNOSIS — Z902 Acquired absence of lung [part of]: Secondary | ICD-10-CM | POA: Diagnosis not present

## 2020-04-17 DIAGNOSIS — J9601 Acute respiratory failure with hypoxia: Secondary | ICD-10-CM | POA: Diagnosis not present

## 2020-04-17 DIAGNOSIS — R64 Cachexia: Secondary | ICD-10-CM | POA: Diagnosis not present

## 2020-04-17 DIAGNOSIS — J1282 Pneumonia due to coronavirus disease 2019: Secondary | ICD-10-CM | POA: Diagnosis not present

## 2020-04-17 DIAGNOSIS — Z01812 Encounter for preprocedural laboratory examination: Secondary | ICD-10-CM | POA: Diagnosis not present

## 2020-04-17 DIAGNOSIS — Z20822 Contact with and (suspected) exposure to covid-19: Secondary | ICD-10-CM | POA: Diagnosis not present

## 2020-04-17 DIAGNOSIS — R509 Fever, unspecified: Secondary | ICD-10-CM | POA: Diagnosis not present

## 2020-04-17 DIAGNOSIS — R7989 Other specified abnormal findings of blood chemistry: Secondary | ICD-10-CM | POA: Diagnosis not present

## 2020-04-17 DIAGNOSIS — I2510 Atherosclerotic heart disease of native coronary artery without angina pectoris: Secondary | ICD-10-CM | POA: Diagnosis not present

## 2020-04-17 DIAGNOSIS — E559 Vitamin D deficiency, unspecified: Secondary | ICD-10-CM | POA: Diagnosis not present

## 2020-04-17 DIAGNOSIS — E785 Hyperlipidemia, unspecified: Secondary | ICD-10-CM | POA: Diagnosis not present

## 2020-04-17 DIAGNOSIS — Z681 Body mass index (BMI) 19 or less, adult: Secondary | ICD-10-CM | POA: Diagnosis not present

## 2020-04-17 DIAGNOSIS — Z9049 Acquired absence of other specified parts of digestive tract: Secondary | ICD-10-CM | POA: Diagnosis not present

## 2020-04-17 DIAGNOSIS — K219 Gastro-esophageal reflux disease without esophagitis: Secondary | ICD-10-CM | POA: Diagnosis not present

## 2020-04-17 DIAGNOSIS — Z85118 Personal history of other malignant neoplasm of bronchus and lung: Secondary | ICD-10-CM | POA: Diagnosis not present

## 2020-04-17 DIAGNOSIS — Z803 Family history of malignant neoplasm of breast: Secondary | ICD-10-CM | POA: Diagnosis not present

## 2020-04-17 DIAGNOSIS — R0602 Shortness of breath: Secondary | ICD-10-CM | POA: Diagnosis not present

## 2020-04-17 DIAGNOSIS — M5030 Other cervical disc degeneration, unspecified cervical region: Secondary | ICD-10-CM | POA: Diagnosis not present

## 2020-04-17 DIAGNOSIS — F17210 Nicotine dependence, cigarettes, uncomplicated: Secondary | ICD-10-CM | POA: Diagnosis not present

## 2020-04-17 DIAGNOSIS — Z9989 Dependence on other enabling machines and devices: Secondary | ICD-10-CM | POA: Diagnosis not present

## 2020-04-17 DIAGNOSIS — R918 Other nonspecific abnormal finding of lung field: Secondary | ICD-10-CM | POA: Diagnosis not present

## 2020-04-17 DIAGNOSIS — G629 Polyneuropathy, unspecified: Secondary | ICD-10-CM | POA: Diagnosis not present

## 2020-04-17 DIAGNOSIS — G4733 Obstructive sleep apnea (adult) (pediatric): Secondary | ICD-10-CM | POA: Diagnosis not present

## 2020-04-17 DIAGNOSIS — J439 Emphysema, unspecified: Secondary | ICD-10-CM | POA: Diagnosis not present

## 2020-04-17 DIAGNOSIS — Z8249 Family history of ischemic heart disease and other diseases of the circulatory system: Secondary | ICD-10-CM | POA: Diagnosis not present

## 2020-04-17 DIAGNOSIS — E039 Hypothyroidism, unspecified: Secondary | ICD-10-CM | POA: Diagnosis not present

## 2020-04-17 DIAGNOSIS — J449 Chronic obstructive pulmonary disease, unspecified: Secondary | ICD-10-CM | POA: Diagnosis not present

## 2020-04-17 DIAGNOSIS — U071 COVID-19: Secondary | ICD-10-CM | POA: Diagnosis not present

## 2020-04-18 DIAGNOSIS — J9601 Acute respiratory failure with hypoxia: Secondary | ICD-10-CM | POA: Diagnosis not present

## 2020-04-18 DIAGNOSIS — J449 Chronic obstructive pulmonary disease, unspecified: Secondary | ICD-10-CM | POA: Diagnosis not present

## 2020-04-18 DIAGNOSIS — E039 Hypothyroidism, unspecified: Secondary | ICD-10-CM | POA: Diagnosis not present

## 2020-04-18 DIAGNOSIS — U071 COVID-19: Secondary | ICD-10-CM | POA: Diagnosis not present

## 2020-04-20 DIAGNOSIS — U071 COVID-19: Secondary | ICD-10-CM | POA: Diagnosis not present

## 2020-05-21 DIAGNOSIS — U071 COVID-19: Secondary | ICD-10-CM | POA: Diagnosis not present

## 2020-06-12 DIAGNOSIS — E559 Vitamin D deficiency, unspecified: Secondary | ICD-10-CM | POA: Diagnosis not present

## 2020-06-12 DIAGNOSIS — Z0000 Encounter for general adult medical examination without abnormal findings: Secondary | ICD-10-CM | POA: Diagnosis not present

## 2020-06-12 DIAGNOSIS — F17200 Nicotine dependence, unspecified, uncomplicated: Secondary | ICD-10-CM | POA: Diagnosis not present

## 2020-06-12 DIAGNOSIS — I2510 Atherosclerotic heart disease of native coronary artery without angina pectoris: Secondary | ICD-10-CM | POA: Diagnosis not present

## 2020-06-12 DIAGNOSIS — M8588 Other specified disorders of bone density and structure, other site: Secondary | ICD-10-CM | POA: Diagnosis not present

## 2020-06-12 DIAGNOSIS — J449 Chronic obstructive pulmonary disease, unspecified: Secondary | ICD-10-CM | POA: Diagnosis not present

## 2020-06-12 DIAGNOSIS — E039 Hypothyroidism, unspecified: Secondary | ICD-10-CM | POA: Diagnosis not present

## 2020-06-20 DIAGNOSIS — U071 COVID-19: Secondary | ICD-10-CM | POA: Diagnosis not present

## 2020-07-06 DIAGNOSIS — Z20822 Contact with and (suspected) exposure to covid-19: Secondary | ICD-10-CM | POA: Diagnosis not present

## 2020-07-21 DIAGNOSIS — U071 COVID-19: Secondary | ICD-10-CM | POA: Diagnosis not present

## 2020-08-20 DIAGNOSIS — U071 COVID-19: Secondary | ICD-10-CM | POA: Diagnosis not present

## 2020-09-13 DIAGNOSIS — M5442 Lumbago with sciatica, left side: Secondary | ICD-10-CM | POA: Diagnosis not present

## 2020-09-13 DIAGNOSIS — M62830 Muscle spasm of back: Secondary | ICD-10-CM | POA: Diagnosis not present

## 2020-09-13 DIAGNOSIS — M62838 Other muscle spasm: Secondary | ICD-10-CM | POA: Diagnosis not present

## 2020-09-13 DIAGNOSIS — M5432 Sciatica, left side: Secondary | ICD-10-CM | POA: Diagnosis not present

## 2020-09-20 DIAGNOSIS — U071 COVID-19: Secondary | ICD-10-CM | POA: Diagnosis not present

## 2020-10-03 DIAGNOSIS — N135 Crossing vessel and stricture of ureter without hydronephrosis: Secondary | ICD-10-CM | POA: Diagnosis not present

## 2020-10-03 DIAGNOSIS — Z85118 Personal history of other malignant neoplasm of bronchus and lung: Secondary | ICD-10-CM | POA: Diagnosis not present

## 2020-10-03 DIAGNOSIS — E890 Postprocedural hypothyroidism: Secondary | ICD-10-CM | POA: Diagnosis not present

## 2020-10-03 DIAGNOSIS — M5030 Other cervical disc degeneration, unspecified cervical region: Secondary | ICD-10-CM | POA: Diagnosis not present

## 2020-10-03 DIAGNOSIS — K219 Gastro-esophageal reflux disease without esophagitis: Secondary | ICD-10-CM | POA: Diagnosis not present

## 2020-10-03 DIAGNOSIS — Z466 Encounter for fitting and adjustment of urinary device: Secondary | ICD-10-CM | POA: Diagnosis not present

## 2020-10-03 DIAGNOSIS — Z8616 Personal history of COVID-19: Secondary | ICD-10-CM | POA: Diagnosis not present

## 2020-10-03 DIAGNOSIS — E785 Hyperlipidemia, unspecified: Secondary | ICD-10-CM | POA: Diagnosis not present

## 2020-10-03 DIAGNOSIS — G4733 Obstructive sleep apnea (adult) (pediatric): Secondary | ICD-10-CM | POA: Diagnosis not present

## 2020-10-03 DIAGNOSIS — Z79899 Other long term (current) drug therapy: Secondary | ICD-10-CM | POA: Diagnosis not present

## 2020-10-03 DIAGNOSIS — Z960 Presence of urogenital implants: Secondary | ICD-10-CM | POA: Diagnosis not present

## 2020-10-03 DIAGNOSIS — I2510 Atherosclerotic heart disease of native coronary artery without angina pectoris: Secondary | ICD-10-CM | POA: Diagnosis not present

## 2020-10-03 DIAGNOSIS — J449 Chronic obstructive pulmonary disease, unspecified: Secondary | ICD-10-CM | POA: Diagnosis not present

## 2020-10-03 DIAGNOSIS — Z87891 Personal history of nicotine dependence: Secondary | ICD-10-CM | POA: Diagnosis not present

## 2020-10-10 DIAGNOSIS — N1330 Unspecified hydronephrosis: Secondary | ICD-10-CM | POA: Diagnosis not present

## 2020-10-20 DIAGNOSIS — I700 Atherosclerosis of aorta: Secondary | ICD-10-CM | POA: Diagnosis not present

## 2020-10-20 DIAGNOSIS — N1330 Unspecified hydronephrosis: Secondary | ICD-10-CM | POA: Diagnosis not present

## 2020-10-20 DIAGNOSIS — N200 Calculus of kidney: Secondary | ICD-10-CM | POA: Diagnosis not present

## 2020-10-21 DIAGNOSIS — U071 COVID-19: Secondary | ICD-10-CM | POA: Diagnosis not present

## 2020-10-26 DIAGNOSIS — Z20822 Contact with and (suspected) exposure to covid-19: Secondary | ICD-10-CM | POA: Diagnosis not present

## 2020-10-26 DIAGNOSIS — J441 Chronic obstructive pulmonary disease with (acute) exacerbation: Secondary | ICD-10-CM | POA: Diagnosis not present

## 2020-10-26 DIAGNOSIS — R0902 Hypoxemia: Secondary | ICD-10-CM | POA: Diagnosis not present

## 2020-10-26 DIAGNOSIS — J9611 Chronic respiratory failure with hypoxia: Secondary | ICD-10-CM | POA: Diagnosis not present

## 2020-10-26 DIAGNOSIS — R059 Cough, unspecified: Secondary | ICD-10-CM | POA: Diagnosis not present

## 2020-10-26 DIAGNOSIS — R918 Other nonspecific abnormal finding of lung field: Secondary | ICD-10-CM | POA: Diagnosis not present

## 2020-10-26 DIAGNOSIS — J439 Emphysema, unspecified: Secondary | ICD-10-CM | POA: Diagnosis not present

## 2020-10-26 DIAGNOSIS — C3412 Malignant neoplasm of upper lobe, left bronchus or lung: Secondary | ICD-10-CM | POA: Diagnosis not present

## 2020-10-26 DIAGNOSIS — R0602 Shortness of breath: Secondary | ICD-10-CM | POA: Diagnosis not present

## 2020-10-30 DIAGNOSIS — G4733 Obstructive sleep apnea (adult) (pediatric): Secondary | ICD-10-CM | POA: Diagnosis not present

## 2020-11-01 DIAGNOSIS — R0602 Shortness of breath: Secondary | ICD-10-CM | POA: Diagnosis not present

## 2020-11-01 DIAGNOSIS — G4733 Obstructive sleep apnea (adult) (pediatric): Secondary | ICD-10-CM | POA: Diagnosis not present

## 2020-11-01 DIAGNOSIS — J9611 Chronic respiratory failure with hypoxia: Secondary | ICD-10-CM | POA: Diagnosis not present

## 2020-11-01 DIAGNOSIS — F17210 Nicotine dependence, cigarettes, uncomplicated: Secondary | ICD-10-CM | POA: Diagnosis not present

## 2020-11-01 DIAGNOSIS — J449 Chronic obstructive pulmonary disease, unspecified: Secondary | ICD-10-CM | POA: Diagnosis not present

## 2020-11-11 DIAGNOSIS — N1330 Unspecified hydronephrosis: Secondary | ICD-10-CM | POA: Diagnosis not present

## 2020-11-11 DIAGNOSIS — R109 Unspecified abdominal pain: Secondary | ICD-10-CM | POA: Diagnosis not present

## 2020-11-18 DIAGNOSIS — U071 COVID-19: Secondary | ICD-10-CM | POA: Diagnosis not present

## 2020-12-19 DIAGNOSIS — U071 COVID-19: Secondary | ICD-10-CM | POA: Diagnosis not present

## 2021-01-03 DIAGNOSIS — E559 Vitamin D deficiency, unspecified: Secondary | ICD-10-CM | POA: Diagnosis not present

## 2021-01-03 DIAGNOSIS — E039 Hypothyroidism, unspecified: Secondary | ICD-10-CM | POA: Diagnosis not present

## 2021-01-03 DIAGNOSIS — Z5941 Food insecurity: Secondary | ICD-10-CM | POA: Diagnosis not present

## 2021-01-03 DIAGNOSIS — Z9989 Dependence on other enabling machines and devices: Secondary | ICD-10-CM | POA: Diagnosis not present

## 2021-01-03 DIAGNOSIS — G4733 Obstructive sleep apnea (adult) (pediatric): Secondary | ICD-10-CM | POA: Diagnosis not present

## 2021-01-03 DIAGNOSIS — M545 Low back pain, unspecified: Secondary | ICD-10-CM | POA: Diagnosis not present

## 2021-01-03 DIAGNOSIS — J449 Chronic obstructive pulmonary disease, unspecified: Secondary | ICD-10-CM | POA: Diagnosis not present

## 2021-01-03 DIAGNOSIS — G8929 Other chronic pain: Secondary | ICD-10-CM | POA: Diagnosis not present

## 2021-01-03 DIAGNOSIS — I2510 Atherosclerotic heart disease of native coronary artery without angina pectoris: Secondary | ICD-10-CM | POA: Diagnosis not present

## 2021-01-03 DIAGNOSIS — E785 Hyperlipidemia, unspecified: Secondary | ICD-10-CM | POA: Diagnosis not present

## 2021-01-03 DIAGNOSIS — Z0000 Encounter for general adult medical examination without abnormal findings: Secondary | ICD-10-CM | POA: Diagnosis not present

## 2021-01-18 DIAGNOSIS — U071 COVID-19: Secondary | ICD-10-CM | POA: Diagnosis not present

## 2021-01-29 DIAGNOSIS — G4733 Obstructive sleep apnea (adult) (pediatric): Secondary | ICD-10-CM | POA: Diagnosis not present

## 2021-02-01 DIAGNOSIS — F17210 Nicotine dependence, cigarettes, uncomplicated: Secondary | ICD-10-CM | POA: Diagnosis not present

## 2021-02-01 DIAGNOSIS — J449 Chronic obstructive pulmonary disease, unspecified: Secondary | ICD-10-CM | POA: Diagnosis not present

## 2021-02-01 DIAGNOSIS — J9611 Chronic respiratory failure with hypoxia: Secondary | ICD-10-CM | POA: Diagnosis not present

## 2021-02-18 DIAGNOSIS — U071 COVID-19: Secondary | ICD-10-CM | POA: Diagnosis not present

## 2021-03-20 DIAGNOSIS — U071 COVID-19: Secondary | ICD-10-CM | POA: Diagnosis not present

## 2021-04-09 DIAGNOSIS — E785 Hyperlipidemia, unspecified: Secondary | ICD-10-CM | POA: Diagnosis not present

## 2021-04-09 DIAGNOSIS — R072 Precordial pain: Secondary | ICD-10-CM | POA: Diagnosis not present

## 2021-04-09 DIAGNOSIS — J449 Chronic obstructive pulmonary disease, unspecified: Secondary | ICD-10-CM | POA: Diagnosis not present

## 2021-04-09 DIAGNOSIS — I2510 Atherosclerotic heart disease of native coronary artery without angina pectoris: Secondary | ICD-10-CM | POA: Diagnosis not present

## 2021-04-09 DIAGNOSIS — R0600 Dyspnea, unspecified: Secondary | ICD-10-CM | POA: Diagnosis not present

## 2021-04-16 DIAGNOSIS — J449 Chronic obstructive pulmonary disease, unspecified: Secondary | ICD-10-CM | POA: Diagnosis not present

## 2021-04-16 DIAGNOSIS — E559 Vitamin D deficiency, unspecified: Secondary | ICD-10-CM | POA: Diagnosis not present

## 2021-04-16 DIAGNOSIS — J9601 Acute respiratory failure with hypoxia: Secondary | ICD-10-CM | POA: Diagnosis not present

## 2021-04-25 DIAGNOSIS — J449 Chronic obstructive pulmonary disease, unspecified: Secondary | ICD-10-CM | POA: Diagnosis not present

## 2021-04-25 DIAGNOSIS — R0602 Shortness of breath: Secondary | ICD-10-CM | POA: Diagnosis not present

## 2021-04-25 DIAGNOSIS — F17210 Nicotine dependence, cigarettes, uncomplicated: Secondary | ICD-10-CM | POA: Diagnosis not present

## 2021-04-25 DIAGNOSIS — C7802 Secondary malignant neoplasm of left lung: Secondary | ICD-10-CM | POA: Diagnosis not present

## 2021-04-25 DIAGNOSIS — J9611 Chronic respiratory failure with hypoxia: Secondary | ICD-10-CM | POA: Diagnosis not present

## 2021-04-30 DIAGNOSIS — G4733 Obstructive sleep apnea (adult) (pediatric): Secondary | ICD-10-CM | POA: Diagnosis not present

## 2021-05-02 ENCOUNTER — Inpatient Hospital Stay (HOSPITAL_COMMUNITY)
Admission: EM | Admit: 2021-05-02 | Discharge: 2021-05-05 | DRG: 659 | Disposition: A | Discharge status: Home Health | Payer: Medicare Other | Attending: Internal Medicine | Admitting: Internal Medicine

## 2021-05-02 ENCOUNTER — Emergency Department (HOSPITAL_COMMUNITY): Payer: Medicare Other

## 2021-05-02 DIAGNOSIS — N1330 Unspecified hydronephrosis: Secondary | ICD-10-CM | POA: Diagnosis not present

## 2021-05-02 DIAGNOSIS — E440 Moderate protein-calorie malnutrition: Secondary | ICD-10-CM | POA: Diagnosis not present

## 2021-05-02 DIAGNOSIS — R404 Transient alteration of awareness: Secondary | ICD-10-CM | POA: Diagnosis not present

## 2021-05-02 DIAGNOSIS — Z20822 Contact with and (suspected) exposure to covid-19: Secondary | ICD-10-CM | POA: Diagnosis present

## 2021-05-02 DIAGNOSIS — E785 Hyperlipidemia, unspecified: Secondary | ICD-10-CM | POA: Diagnosis present

## 2021-05-02 DIAGNOSIS — D72828 Other elevated white blood cell count: Secondary | ICD-10-CM | POA: Diagnosis not present

## 2021-05-02 DIAGNOSIS — Z9981 Dependence on supplemental oxygen: Secondary | ICD-10-CM | POA: Diagnosis not present

## 2021-05-02 DIAGNOSIS — K219 Gastro-esophageal reflux disease without esophagitis: Secondary | ICD-10-CM | POA: Diagnosis not present

## 2021-05-02 DIAGNOSIS — G928 Other toxic encephalopathy: Secondary | ICD-10-CM | POA: Diagnosis not present

## 2021-05-02 DIAGNOSIS — N3289 Other specified disorders of bladder: Secondary | ICD-10-CM | POA: Diagnosis not present

## 2021-05-02 DIAGNOSIS — E039 Hypothyroidism, unspecified: Secondary | ICD-10-CM | POA: Diagnosis present

## 2021-05-02 DIAGNOSIS — N132 Hydronephrosis with renal and ureteral calculous obstruction: Secondary | ICD-10-CM | POA: Diagnosis not present

## 2021-05-02 DIAGNOSIS — Z9221 Personal history of antineoplastic chemotherapy: Secondary | ICD-10-CM

## 2021-05-02 DIAGNOSIS — R1011 Right upper quadrant pain: Secondary | ICD-10-CM

## 2021-05-02 DIAGNOSIS — E890 Postprocedural hypothyroidism: Secondary | ICD-10-CM | POA: Diagnosis not present

## 2021-05-02 DIAGNOSIS — Z743 Need for continuous supervision: Secondary | ICD-10-CM | POA: Diagnosis not present

## 2021-05-02 DIAGNOSIS — J9611 Chronic respiratory failure with hypoxia: Secondary | ICD-10-CM | POA: Diagnosis not present

## 2021-05-02 DIAGNOSIS — Z85118 Personal history of other malignant neoplasm of bronchus and lung: Secondary | ICD-10-CM

## 2021-05-02 DIAGNOSIS — F319 Bipolar disorder, unspecified: Secondary | ICD-10-CM | POA: Diagnosis present

## 2021-05-02 DIAGNOSIS — Z90710 Acquired absence of both cervix and uterus: Secondary | ICD-10-CM

## 2021-05-02 DIAGNOSIS — G9340 Encephalopathy, unspecified: Secondary | ICD-10-CM | POA: Diagnosis present

## 2021-05-02 DIAGNOSIS — R4182 Altered mental status, unspecified: Secondary | ICD-10-CM

## 2021-05-02 DIAGNOSIS — J449 Chronic obstructive pulmonary disease, unspecified: Secondary | ICD-10-CM | POA: Diagnosis not present

## 2021-05-02 DIAGNOSIS — Z79899 Other long term (current) drug therapy: Secondary | ICD-10-CM | POA: Diagnosis not present

## 2021-05-02 DIAGNOSIS — F32A Depression, unspecified: Secondary | ICD-10-CM | POA: Diagnosis not present

## 2021-05-02 DIAGNOSIS — Z79890 Hormone replacement therapy (postmenopausal): Secondary | ICD-10-CM

## 2021-05-02 DIAGNOSIS — F17210 Nicotine dependence, cigarettes, uncomplicated: Secondary | ICD-10-CM | POA: Diagnosis not present

## 2021-05-02 DIAGNOSIS — Z902 Acquired absence of lung [part of]: Secondary | ICD-10-CM | POA: Diagnosis not present

## 2021-05-02 DIAGNOSIS — R001 Bradycardia, unspecified: Secondary | ICD-10-CM | POA: Diagnosis not present

## 2021-05-02 DIAGNOSIS — R410 Disorientation, unspecified: Secondary | ICD-10-CM | POA: Diagnosis not present

## 2021-05-02 DIAGNOSIS — I499 Cardiac arrhythmia, unspecified: Secondary | ICD-10-CM | POA: Diagnosis not present

## 2021-05-02 DIAGNOSIS — R0600 Dyspnea, unspecified: Secondary | ICD-10-CM | POA: Diagnosis not present

## 2021-05-02 DIAGNOSIS — R6889 Other general symptoms and signs: Secondary | ICD-10-CM | POA: Diagnosis not present

## 2021-05-02 DIAGNOSIS — R079 Chest pain, unspecified: Secondary | ICD-10-CM | POA: Diagnosis not present

## 2021-05-02 DIAGNOSIS — J439 Emphysema, unspecified: Secondary | ICD-10-CM | POA: Diagnosis not present

## 2021-05-02 HISTORY — DX: Acute myocardial infarction, unspecified: I21.9

## 2021-05-02 HISTORY — DX: Personal history of urinary calculi: Z87.442

## 2021-05-02 HISTORY — DX: Bipolar disorder, unspecified: F31.9

## 2021-05-02 HISTORY — DX: Family history of other specified conditions: Z84.89

## 2021-05-02 HISTORY — DX: Anxiety disorder, unspecified: F41.9

## 2021-05-02 HISTORY — DX: Malignant neoplasm of unspecified part of unspecified bronchus or lung: C34.90

## 2021-05-02 HISTORY — DX: Angina pectoris, unspecified: I20.9

## 2021-05-02 HISTORY — DX: Malignant (primary) neoplasm, unspecified: C80.1

## 2021-05-02 HISTORY — DX: Dyspnea, unspecified: R06.00

## 2021-05-02 HISTORY — DX: Post-traumatic stress disorder, unspecified: F43.10

## 2021-05-02 HISTORY — DX: Sleep apnea, unspecified: G47.30

## 2021-05-02 LAB — CBC WITH DIFFERENTIAL/PLATELET
Abs Immature Granulocytes: 0.05 10*3/uL (ref 0.00–0.07)
Basophils Absolute: 0.1 10*3/uL (ref 0.0–0.1)
Basophils Relative: 0 %
Eosinophils Absolute: 0 10*3/uL (ref 0.0–0.5)
Eosinophils Relative: 0 %
HCT: 43.9 % (ref 36.0–46.0)
Hemoglobin: 14.2 g/dL (ref 12.0–15.0)
Immature Granulocytes: 0 %
Lymphocytes Relative: 12 %
Lymphs Abs: 1.5 10*3/uL (ref 0.7–4.0)
MCH: 30.1 pg (ref 26.0–34.0)
MCHC: 32.3 g/dL (ref 30.0–36.0)
MCV: 93.2 fL (ref 80.0–100.0)
Monocytes Absolute: 0.9 10*3/uL (ref 0.1–1.0)
Monocytes Relative: 8 %
Neutro Abs: 9.7 10*3/uL — ABNORMAL HIGH (ref 1.7–7.7)
Neutrophils Relative %: 80 %
Platelets: 257 10*3/uL (ref 150–400)
RBC: 4.71 MIL/uL (ref 3.87–5.11)
RDW: 13.8 % (ref 11.5–15.5)
WBC: 12.3 10*3/uL — ABNORMAL HIGH (ref 4.0–10.5)
nRBC: 0 % (ref 0.0–0.2)

## 2021-05-02 LAB — I-STAT ARTERIAL BLOOD GAS, ED
Acid-Base Excess: 4 mmol/L — ABNORMAL HIGH (ref 0.0–2.0)
Bicarbonate: 27.9 mmol/L (ref 20.0–28.0)
Calcium, Ion: 1.23 mmol/L (ref 1.15–1.40)
HCT: 42 % (ref 36.0–46.0)
Hemoglobin: 14.3 g/dL (ref 12.0–15.0)
O2 Saturation: 93 %
Patient temperature: 98.6
Potassium: 3.7 mmol/L (ref 3.5–5.1)
Sodium: 142 mmol/L (ref 135–145)
TCO2: 29 mmol/L (ref 22–32)
pCO2 arterial: 38.4 mmHg (ref 32.0–48.0)
pH, Arterial: 7.469 — ABNORMAL HIGH (ref 7.350–7.450)
pO2, Arterial: 63 mmHg — ABNORMAL LOW (ref 83.0–108.0)

## 2021-05-02 LAB — COMPREHENSIVE METABOLIC PANEL
ALT: 13 U/L (ref 0–44)
AST: 20 U/L (ref 15–41)
Albumin: 4.1 g/dL (ref 3.5–5.0)
Alkaline Phosphatase: 78 U/L (ref 38–126)
Anion gap: 11 (ref 5–15)
BUN: 16 mg/dL (ref 8–23)
CO2: 26 mmol/L (ref 22–32)
Calcium: 10 mg/dL (ref 8.9–10.3)
Chloride: 105 mmol/L (ref 98–111)
Creatinine, Ser: 0.73 mg/dL (ref 0.44–1.00)
GFR, Estimated: >60 mL/min (ref >60)
Glucose, Bld: 127 mg/dL — ABNORMAL HIGH (ref 70–99)
Potassium: 3.7 mmol/L (ref 3.5–5.1)
Sodium: 142 mmol/L (ref 135–145)
Total Bilirubin: 0.8 mg/dL (ref 0.3–1.2)
Total Protein: 7.8 g/dL (ref 6.5–8.1)

## 2021-05-02 LAB — I-STAT CHEM 8, ED
BUN: 18 mg/dL (ref 8–23)
Calcium, Ion: 1.03 mmol/L — ABNORMAL LOW (ref 1.15–1.40)
Chloride: 108 mmol/L (ref 98–111)
Creatinine, Ser: 0.6 mg/dL (ref 0.44–1.00)
Glucose, Bld: 127 mg/dL — ABNORMAL HIGH (ref 70–99)
HCT: 44 % (ref 36.0–46.0)
Hemoglobin: 15 g/dL (ref 12.0–15.0)
Potassium: 3.7 mmol/L (ref 3.5–5.1)
Sodium: 143 mmol/L (ref 135–145)
TCO2: 25 mmol/L (ref 22–32)

## 2021-05-02 LAB — SALICYLATE LEVEL: Salicylate Lvl: <7 mg/dL — ABNORMAL LOW (ref 7.0–30.0)

## 2021-05-02 LAB — PROTIME-INR
INR: 1 (ref 0.8–1.2)
Prothrombin Time: 12.9 seconds (ref 11.4–15.2)

## 2021-05-02 LAB — ETHANOL: Alcohol, Ethyl (B): <10 mg/dL (ref <10)

## 2021-05-02 LAB — LACTIC ACID, PLASMA: Lactic Acid, Venous: 1.3 mmol/L (ref 0.5–1.9)

## 2021-05-02 LAB — AMMONIA: Ammonia: 61 umol/L — ABNORMAL HIGH (ref 9–35)

## 2021-05-02 LAB — TROPONIN I (HIGH SENSITIVITY): Troponin I (High Sensitivity): 7 ng/L (ref <18)

## 2021-05-02 LAB — RESP PANEL BY RT-PCR (FLU A&B, COVID) ARPGX2
Influenza A by PCR: NEGATIVE
Influenza B by PCR: NEGATIVE
SARS Coronavirus 2 by RT PCR: NEGATIVE

## 2021-05-02 LAB — LIPASE, BLOOD: Lipase: 23 U/L (ref 11–51)

## 2021-05-02 LAB — ACETAMINOPHEN LEVEL: Acetaminophen (Tylenol), Serum: <10 ug/mL — ABNORMAL LOW (ref 10–30)

## 2021-05-02 LAB — CK: Total CK: 101 U/L (ref 38–234)

## 2021-05-02 LAB — CBG MONITORING, ED: Glucose-Capillary: 116 mg/dL — ABNORMAL HIGH (ref 70–99)

## 2021-05-02 MED ORDER — ONDANSETRON HCL 4 MG/2ML IJ SOLN
4.0000 mg | Freq: Once | INTRAMUSCULAR | Status: AC
  Administered 2021-05-02: 4 mg via INTRAVENOUS
  Filled 2021-05-02: qty 2

## 2021-05-02 MED ORDER — SODIUM CHLORIDE 0.9 % IV SOLN: Freq: Once | INTRAVENOUS | Status: AC

## 2021-05-02 MED ORDER — NALOXONE HCL 0.4 MG/ML IJ SOLN
0.4000 mg | Freq: Once | INTRAMUSCULAR | Status: AC
  Administered 2021-05-02: 0.4 mg via INTRAVENOUS
  Filled 2021-05-02: qty 1

## 2021-05-02 NOTE — ED Provider Notes (Signed)
[facility] Provider Note   CSN: [id number] Arrival date & time: 05/02/21  1906     History Chief Complaint  Patient presents with   Altered Mental Status    [facility] is a 65 y.o. female.  65 year old female with prior medical history as detailed below presents for evaluation.  Patient arrives by EMS for evaluation of AMS.  Patient is unable provide significant details.  She is responsive to painful stimuli.  She screams and yells at staff and provider.  She is initially unable to provide details.  EMS reports that a neighbor reportedly found her acting like this and called for assistance.  Additional history attempted by contacting patient's listed contact.  No contact made with significant other.  No contact made with daughter listed as contact.  Shortly after patient's initial arrival, another daughter who is estranged arrived.  This daughter reports that she has not seen the patient since July.  This daughter reports that the patient has a history of polysubstance abuse in the past.  The history is provided by the patient and a relative.  Altered Mental Status Presenting symptoms: behavior changes, combativeness and disorientation   Severity:  Moderate Episode history:  Unable to specify Timing:  Unable to specify Progression:  Unable to specify     Past Medical History:  Diagnosis Date   COPD (chronic obstructive pulmonary disease) ([place])    Hypothyroidism    Thyroid disease     Patient Active Problem List   Diagnosis Date Noted   CAP (community acquired pneumonia) 04/10/2020   GERD (gastroesophageal reflux disease) 04/28/2012   Hypothyroidism (acquired) 04/28/2012   Nicotine abuse 04/28/2012   Depression 04/28/2012   Chest pain, atypical 04/28/2012    Past Surgical History:  Procedure Laterality Date   kidney stent     thyroidectomy       OB History   No obstetric history on file.     Family History   Family history unknown: Yes    Social History   Tobacco Use   Smoking status: Every Day    Types: Cigarettes   Smokeless tobacco: Never  Vaping Use   Vaping Use: Never used  Substance Use Topics   Alcohol use: Not Currently   Drug use: Not Currently    Home Medications Prior to Admission medications   Medication Sig Start Date End Date Taking? Authorizing Provider  albuterol (VENTOLIN HFA) 108 (90 Base) MCG/ACT inhaler Inhale 2 puffs into the lungs every 6 (six) hours as needed for wheezing or shortness of breath.  03/23/20   [provider]  buPROPion (WELLBUTRIN XL) 300 MG 24 hr tablet Take 300 mg by mouth daily after breakfast. 01/18/20   [provider]  famotidine (PEPCID) 40 MG tablet Take 40 mg by mouth daily as needed for heartburn or indigestion.  02/14/20   [provider]  HYDROcodone-homatropine (HYCODAN) 5-1.5 MG/5ML syrup Take 5 mLs by mouth every 6 (six) hours as needed for cough. 04/12/20   Dwyane Dee, MD  levothyroxine (SYNTHROID) 137 MCG tablet Take 137 mcg by mouth daily before breakfast. 03/14/20   [provider]  meloxicam (MOBIC) 15 MG tablet Take 15 mg by mouth daily. 04/07/20   [provider]  methocarbamol (ROBAXIN) 500 MG tablet Take 500 mg by mouth every 12 (twelve) hours as needed for muscle spasms.  02/10/20   [provider]  omeprazole (PRILOSEC) 20 MG capsule Take 40 mg by mouth daily.  [provider]  ondansetron (ZOFRAN) 4 MG tablet Take 4 mg by mouth every 8 (eight) hours as needed for nausea or vomiting.  03/10/20   [provider]  oxybutynin (DITROPAN) 5 MG tablet Take 5 mg by mouth 3 (three) times daily. 03/15/20   [provider]  prazosin (MINIPRESS) 2 MG capsule Take 2 mg by mouth at bedtime. 01/11/20   [provider]  QUEtiapine (SEROQUEL) 200 MG tablet Take 200 mg by mouth at bedtime. 01/18/20   [provider]  rosuvastatin (CRESTOR) 20 MG tablet  Take 20 mg by mouth daily. 01/15/20   [provider]  tamsulosin (FLOMAX) 0.4 MG CAPS capsule Take 0.4 mg by mouth daily. 03/21/20   [provider]    Allergies    Red dye, Aspirin, and Tape  Review of Systems   Review of Systems  Unable to perform ROS: Acuity of condition   Physical Exam Updated Vital Signs BP 117/81   Pulse (!) 41   Resp (!) 28   SpO2 96%   Physical Exam Vitals and nursing note reviewed.  Constitutional:      General: She is not in acute distress.    Appearance: Normal appearance. She is well-developed.  HENT:     Head: Normocephalic and atraumatic.  Eyes:     Conjunctiva/sclera: Conjunctivae normal.     Pupils: Pupils are equal, round, and reactive to light.  Cardiovascular:     Rate and Rhythm: Normal rate and regular rhythm.     Heart sounds: Normal heart sounds.  Pulmonary:     Effort: Pulmonary effort is normal. No respiratory distress.     Breath sounds: Normal breath sounds.  Abdominal:     General: There is no distension.     Palpations: Abdomen is soft.     Tenderness: There is no abdominal tenderness.  Musculoskeletal:        General: No deformity. Normal range of motion.     Cervical back: Normal range of motion and neck supple.  Skin:    General: Skin is warm and dry.  Neurological:     General: No focal deficit present.     Mental Status: She is alert.     Comments: Patient is initially confused and agitated.  She is yelling and screaming at staff when care is attempted.  Administration of 0.4 of Narcan did improve her mentation.    She denies abuse or OD on narcotics today.    ED Results / Procedures / Treatments   Labs (all labs ordered are listed, but only abnormal results are displayed) Labs Reviewed  COMPREHENSIVE METABOLIC PANEL - Abnormal; Notable for the following components:      Result Value   Glucose, Bld 127 (*)    All other components within normal limits  CBC WITH DIFFERENTIAL/PLATELET -  Abnormal; Notable for the following components:   WBC 12.3 (*)    Neutro Abs 9.7 (*)    All other components within normal limits  I-STAT CHEM 8, ED - Abnormal; Notable for the following components:   Glucose, Bld 127 (*)    Calcium, Ion 1.03 (*)    All other components within normal limits  CBG MONITORING, ED - Abnormal; Notable for the following components:   Glucose-Capillary 116 (*)    All other components within normal limits  I-STAT ARTERIAL BLOOD GAS, ED - Abnormal; Notable for the following components:   pH, Arterial 7.469 (*)    pO2, Arterial 63 (*)  Acid-Base Excess 4.0 (*)    All other components within normal limits  RESP PANEL BY RT-PCR (FLU A&B, COVID) ARPGX2  LACTIC ACID, PLASMA  PROTIME-INR  CK  LIPASE, BLOOD  RAPID URINE DRUG SCREEN, HOSP PERFORMED  URINALYSIS, ROUTINE W REFLEX MICROSCOPIC  ETHANOL  LACTIC ACID, PLASMA  AMMONIA  ACETAMINOPHEN LEVEL  SALICYLATE LEVEL  BLOOD GAS, ARTERIAL  TROPONIN I (HIGH SENSITIVITY)  TROPONIN I (HIGH SENSITIVITY)    EKG EKG Interpretation  Date/Time:  Wednesday May 02 2021 20:04:30 EDT Ventricular Rate:  46 PR Interval:  147 QRS Duration: 111 QT Interval:  438 QTC Calculation: 384 R Axis:   77 Text Interpretation: Sinus bradycardia Biatrial enlargement RSR' in V1 or V2, right VCD or RVH Left ventricular hypertrophy Nonspecific T abnrm, anterolateral leads Confirmed by Dene Gentry [phone]) on 05/02/2021 8:06:08 PM  Radiology CT Head Wo Contrast  Result Date: 05/02/2021 CLINICAL DATA:  Altered mental status EXAM: CT HEAD WITHOUT CONTRAST TECHNIQUE: Contiguous axial images were obtained from the base of the skull through the vertex without intravenous contrast. COMPARISON:  None. FINDINGS: Brain: Normal anatomic configuration. Parenchymal volume loss is commensurate with the patient's age. Mild periventricular white matter changes are present likely reflecting the sequela of small vessel ischemia. There are  punctate left frontal periventricular calcifications which may reflect the sequela of remote infection or inflammation. No abnormal intra or extra-axial mass lesion or fluid collection. No abnormal mass effect or midline shift. No evidence of acute intracranial hemorrhage or infarct. Ventricular size is normal. Cerebellum unremarkable. Vascular: No asymmetric hyperdense vasculature at the skull base. Skull: Intact Sinuses/Orbits: Paranasal sinuses are clear. Orbits are unremarkable. Other: Mastoid air cells and middle ear cavities are clear. IMPRESSION: No acute intracranial hemorrhage or infarct. Mild senescent change. Electronically Signed   By: Fidela Salisbury M.D.   On: 05/02/2021 20:39   DG Chest Portable 1 View  Result Date: 05/02/2021 CLINICAL DATA:  Dyspnea, confusion EXAM: PORTABLE CHEST 1 VIEW COMPARISON:  04/09/2020 FINDINGS: Single frontal view of the chest demonstrates a stable cardiac silhouette. There are postsurgical changes from prior left upper lobectomy, with associated left-sided volume loss. Chronic background scarring and emphysema is again noted. No acute airspace disease, effusion, or pneumothorax. There are no acute bony abnormalities. IMPRESSION: 1. Chronic postsurgical changes from left upper lobectomy. 2. Chronic emphysema.  No acute airspace disease. Electronically Signed   By: Randa Ngo M.D.   On: 05/02/2021 20:03    Procedures Procedures   Medications Ordered in ED Medications  ondansetron (ZOFRAN) injection 4 mg (has no administration in time range)  naloxone [facility]) injection 0.4 mg (0.4 mg Intravenous Given 05/02/21 1954)    ED Course  I have reviewed the triage vital signs and the nursing notes.  Pertinent labs & imaging results that were available during my care of the patient were reviewed by me and considered in my medical decision making (see chart for details).    MDM Rules/Calculators/A&P                           MDM  MSE complete  Kelbi L  [facility]way was evaluated in [facility] on 05/02/2021 for the symptoms described in the history of present illness. She was evaluated in the context of the global COVID-19 pandemic, which necessitated consideration that the patient might be at risk for infection with the SARS-CoV-2 virus that causes COVID-19. Institutional protocols and algorithms that pertain to the evaluation of patients  at risk for COVID-19 are in a state of rapid change based on information released by regulatory bodies including the CDC and federal and state organizations. These policies and algorithms were followed during the patient's care in the ED.   Patient is presenting with alteration mental status.  Recent timeframe of symptoms and additional history is difficult to obtain certainly from the patient.  Secondary information was attempted from patient's listed contacts.  No contact with friends or family was made that could provide actual information.  A daughter with limited recent contact with the patient reports that the patient has a history of using medications and drugs inappropriately.  Patient's work-up is on the whole without significant abnormality.  Her ammonia level is noted to be elevated at 61.  CT imaging was without evidence of significant cranial process.  Other screening labs are without significant abnormality.  Patient's mentation is improving during her ED evaluation.  Trial dose of Narcan was administered with transient improvement in the patient's mentation.  Patient would benefit from overnight observation and continued work-up.  Hospitalist service is aware of case and will evaluate for same.   Final Clinical Impression(s) / ED Diagnoses Final diagnoses:  Altered mental status, unspecified altered mental status type    Rx / DC Orders ED Discharge Orders     None        Valarie Merino, MD 05/02/21 2234

## 2021-05-02 NOTE — ED Triage Notes (Addendum)
BIB EMS for AMS, pt lives at home with family. Pt was found on the couch confused and screaming. En route pt intermittently screams. VSS stable aside from Sinus tach. 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

## 2021-05-02 NOTE — ED Notes (Signed)
Emergency contact [place] would like an update

## 2021-05-03 ENCOUNTER — Inpatient Hospital Stay (HOSPITAL_COMMUNITY): Payer: Medicare Other | Admitting: Certified Registered"

## 2021-05-03 ENCOUNTER — Encounter (HOSPITAL_COMMUNITY): Payer: Self-pay | Admitting: Internal Medicine

## 2021-05-03 ENCOUNTER — Encounter (HOSPITAL_COMMUNITY)
Admission: EM | Disposition: A | Discharge status: Home Health | Payer: Self-pay | Source: Home / Self Care | Attending: Internal Medicine

## 2021-05-03 ENCOUNTER — Inpatient Hospital Stay: Admit: 2021-05-03 | Payer: Medicare Other | Admitting: Urology

## 2021-05-03 ENCOUNTER — Inpatient Hospital Stay (HOSPITAL_COMMUNITY): Payer: Medicare Other

## 2021-05-03 ENCOUNTER — Observation Stay (HOSPITAL_COMMUNITY): Payer: Medicare Other

## 2021-05-03 ENCOUNTER — Encounter (HOSPITAL_COMMUNITY): Payer: Self-pay | Admitting: Certified Registered"

## 2021-05-03 DIAGNOSIS — Z79890 Hormone replacement therapy (postmenopausal): Secondary | ICD-10-CM | POA: Diagnosis not present

## 2021-05-03 DIAGNOSIS — J449 Chronic obstructive pulmonary disease, unspecified: Secondary | ICD-10-CM | POA: Diagnosis not present

## 2021-05-03 DIAGNOSIS — J9611 Chronic respiratory failure with hypoxia: Secondary | ICD-10-CM | POA: Diagnosis not present

## 2021-05-03 DIAGNOSIS — Z79899 Other long term (current) drug therapy: Secondary | ICD-10-CM | POA: Diagnosis not present

## 2021-05-03 DIAGNOSIS — E440 Moderate protein-calorie malnutrition: Secondary | ICD-10-CM | POA: Diagnosis not present

## 2021-05-03 DIAGNOSIS — G928 Other toxic encephalopathy: Secondary | ICD-10-CM | POA: Diagnosis not present

## 2021-05-03 DIAGNOSIS — R001 Bradycardia, unspecified: Secondary | ICD-10-CM | POA: Diagnosis not present

## 2021-05-03 DIAGNOSIS — I700 Atherosclerosis of aorta: Secondary | ICD-10-CM | POA: Diagnosis not present

## 2021-05-03 DIAGNOSIS — R410 Disorientation, unspecified: Secondary | ICD-10-CM | POA: Diagnosis present

## 2021-05-03 DIAGNOSIS — N1330 Unspecified hydronephrosis: Secondary | ICD-10-CM | POA: Diagnosis not present

## 2021-05-03 DIAGNOSIS — Z902 Acquired absence of lung [part of]: Secondary | ICD-10-CM | POA: Diagnosis not present

## 2021-05-03 DIAGNOSIS — E785 Hyperlipidemia, unspecified: Secondary | ICD-10-CM | POA: Diagnosis not present

## 2021-05-03 DIAGNOSIS — F319 Bipolar disorder, unspecified: Secondary | ICD-10-CM | POA: Diagnosis present

## 2021-05-03 DIAGNOSIS — G9340 Encephalopathy, unspecified: Secondary | ICD-10-CM | POA: Diagnosis not present

## 2021-05-03 DIAGNOSIS — F32A Depression, unspecified: Secondary | ICD-10-CM | POA: Diagnosis not present

## 2021-05-03 DIAGNOSIS — Z9981 Dependence on supplemental oxygen: Secondary | ICD-10-CM | POA: Diagnosis not present

## 2021-05-03 DIAGNOSIS — R1011 Right upper quadrant pain: Secondary | ICD-10-CM | POA: Diagnosis not present

## 2021-05-03 DIAGNOSIS — Z9221 Personal history of antineoplastic chemotherapy: Secondary | ICD-10-CM | POA: Diagnosis not present

## 2021-05-03 DIAGNOSIS — N132 Hydronephrosis with renal and ureteral calculous obstruction: Secondary | ICD-10-CM | POA: Diagnosis not present

## 2021-05-03 DIAGNOSIS — F17210 Nicotine dependence, cigarettes, uncomplicated: Secondary | ICD-10-CM | POA: Diagnosis not present

## 2021-05-03 DIAGNOSIS — Z90710 Acquired absence of both cervix and uterus: Secondary | ICD-10-CM | POA: Diagnosis not present

## 2021-05-03 DIAGNOSIS — N3289 Other specified disorders of bladder: Secondary | ICD-10-CM | POA: Diagnosis not present

## 2021-05-03 DIAGNOSIS — K219 Gastro-esophageal reflux disease without esophagitis: Secondary | ICD-10-CM | POA: Diagnosis not present

## 2021-05-03 DIAGNOSIS — D72828 Other elevated white blood cell count: Secondary | ICD-10-CM | POA: Diagnosis not present

## 2021-05-03 DIAGNOSIS — E890 Postprocedural hypothyroidism: Secondary | ICD-10-CM | POA: Diagnosis not present

## 2021-05-03 DIAGNOSIS — Z85118 Personal history of other malignant neoplasm of bronchus and lung: Secondary | ICD-10-CM | POA: Diagnosis not present

## 2021-05-03 DIAGNOSIS — Z20822 Contact with and (suspected) exposure to covid-19: Secondary | ICD-10-CM | POA: Diagnosis not present

## 2021-05-03 HISTORY — PX: CYSTOSCOPY W/ URETERAL STENT PLACEMENT: SHX1429

## 2021-05-03 LAB — I-STAT ARTERIAL BLOOD GAS, ED
Acid-Base Excess: 3 mmol/L — ABNORMAL HIGH (ref 0.0–2.0)
Bicarbonate: 28.5 mmol/L — ABNORMAL HIGH (ref 20.0–28.0)
Calcium, Ion: 1.2 mmol/L (ref 1.15–1.40)
HCT: 35 % — ABNORMAL LOW (ref 36.0–46.0)
Hemoglobin: 11.9 g/dL — ABNORMAL LOW (ref 12.0–15.0)
O2 Saturation: 79 %
Patient temperature: 98.3
Potassium: 3.7 mmol/L (ref 3.5–5.1)
Sodium: 144 mmol/L (ref 135–145)
TCO2: 30 mmol/L (ref 22–32)
pCO2 arterial: 44.9 mmHg (ref 32.0–48.0)
pH, Arterial: 7.41 (ref 7.350–7.450)
pO2, Arterial: 43 mmHg — ABNORMAL LOW (ref 83.0–108.0)

## 2021-05-03 LAB — COMPREHENSIVE METABOLIC PANEL
ALT: 13 U/L (ref 0–44)
AST: 17 U/L (ref 15–41)
Albumin: 3.6 g/dL (ref 3.5–5.0)
Alkaline Phosphatase: 74 U/L (ref 38–126)
Anion gap: 10 (ref 5–15)
BUN: 24 mg/dL — ABNORMAL HIGH (ref 8–23)
CO2: 28 mmol/L (ref 22–32)
Calcium: 9.6 mg/dL (ref 8.9–10.3)
Chloride: 105 mmol/L (ref 98–111)
Creatinine, Ser: 0.7 mg/dL (ref 0.44–1.00)
GFR, Estimated: >60 mL/min (ref >60)
Glucose, Bld: 130 mg/dL — ABNORMAL HIGH (ref 70–99)
Potassium: 4.2 mmol/L (ref 3.5–5.1)
Sodium: 143 mmol/L (ref 135–145)
Total Bilirubin: 0.8 mg/dL (ref 0.3–1.2)
Total Protein: 7.2 g/dL (ref 6.5–8.1)

## 2021-05-03 LAB — URINALYSIS, ROUTINE W REFLEX MICROSCOPIC
Bilirubin Urine: NEGATIVE
Glucose, UA: NEGATIVE mg/dL
Hgb urine dipstick: NEGATIVE
Ketones, ur: 5 mg/dL — AB
Leukocytes,Ua: NEGATIVE
Nitrite: NEGATIVE
Protein, ur: NEGATIVE mg/dL
Specific Gravity, Urine: 1.012 (ref 1.005–1.030)
pH: 6 (ref 5.0–8.0)

## 2021-05-03 LAB — CBC WITH DIFFERENTIAL/PLATELET
Abs Immature Granulocytes: 0.03 10*3/uL (ref 0.00–0.07)
Basophils Absolute: 0 10*3/uL (ref 0.0–0.1)
Basophils Relative: 0 %
Eosinophils Absolute: 0 10*3/uL (ref 0.0–0.5)
Eosinophils Relative: 0 %
HCT: 39.4 % (ref 36.0–46.0)
Hemoglobin: 12.5 g/dL (ref 12.0–15.0)
Immature Granulocytes: 0 %
Lymphocytes Relative: 17 %
Lymphs Abs: 1.6 10*3/uL (ref 0.7–4.0)
MCH: 29.2 pg (ref 26.0–34.0)
MCHC: 31.7 g/dL (ref 30.0–36.0)
MCV: 92.1 fL (ref 80.0–100.0)
Monocytes Absolute: 0.6 10*3/uL (ref 0.1–1.0)
Monocytes Relative: 6 %
Neutro Abs: 7.1 10*3/uL (ref 1.7–7.7)
Neutrophils Relative %: 77 %
Platelets: 248 10*3/uL (ref 150–400)
RBC: 4.28 MIL/uL (ref 3.87–5.11)
RDW: 13.9 % (ref 11.5–15.5)
WBC: 9.3 10*3/uL (ref 4.0–10.5)
nRBC: 0 % (ref 0.0–0.2)

## 2021-05-03 LAB — RAPID URINE DRUG SCREEN, HOSP PERFORMED
Amphetamines: NOT DETECTED
Barbiturates: NOT DETECTED
Benzodiazepines: NOT DETECTED
Cocaine: NOT DETECTED
Opiates: NOT DETECTED
Tetrahydrocannabinol: POSITIVE — AB

## 2021-05-03 LAB — CBG MONITORING, ED
Glucose-Capillary: 117 mg/dL — ABNORMAL HIGH (ref 70–99)
Glucose-Capillary: 111 mg/dL — ABNORMAL HIGH (ref 70–99)

## 2021-05-03 LAB — HIV ANTIBODY (ROUTINE TESTING W REFLEX): HIV Screen 4th Generation wRfx: NONREACTIVE

## 2021-05-03 LAB — COOXEMETRY PANEL
Carboxyhemoglobin: 1.7 % — ABNORMAL HIGH (ref 0.5–1.5)
Methemoglobin: 0.7 % (ref 0.0–1.5)
O2 Saturation: 79.5 %
Total hemoglobin: 11.8 g/dL — ABNORMAL LOW (ref 12.0–16.0)

## 2021-05-03 LAB — AMMONIA: Ammonia: <9 umol/L — ABNORMAL LOW (ref 9–35)

## 2021-05-03 LAB — GLUCOSE, CAPILLARY: Glucose-Capillary: 95 mg/dL (ref 70–99)

## 2021-05-03 LAB — TSH: TSH: 0.562 u[IU]/mL (ref 0.350–4.500)

## 2021-05-03 SURGERY — CYSTOSCOPY, WITH RETROGRADE PYELOGRAM AND URETERAL STENT INSERTION
Anesthesia: General | Laterality: Right

## 2021-05-03 MED ORDER — 0.9 % SODIUM CHLORIDE (POUR BTL) OPTIME
TOPICAL | Status: DC | PRN
  Administered 2021-05-03: 1000 mL

## 2021-05-03 MED ORDER — PROPOFOL 10 MG/ML IV BOLUS
INTRAVENOUS | Status: DC | PRN
  Administered 2021-05-03: 150 mg via INTRAVENOUS

## 2021-05-03 MED ORDER — LORAZEPAM 2 MG/ML IJ SOLN
0.5000 mg | Freq: Once | INTRAMUSCULAR | Status: AC
  Administered 2021-05-03: 0.5 mg via INTRAVENOUS
  Filled 2021-05-03: qty 1

## 2021-05-03 MED ORDER — VENLAFAXINE HCL ER 75 MG PO CP24
75.0000 mg | ORAL_CAPSULE | Freq: Every day | ORAL | Status: DC
  Administered 2021-05-04 – 2021-05-05 (×2): 75 mg via ORAL
  Filled 2021-05-03 (×3): qty 1

## 2021-05-03 MED ORDER — ONDANSETRON HCL 4 MG/2ML IJ SOLN
INTRAMUSCULAR | Status: DC | PRN
  Administered 2021-05-03: 4 mg via INTRAVENOUS

## 2021-05-03 MED ORDER — SERTRALINE HCL 100 MG PO TABS
100.0000 mg | ORAL_TABLET | Freq: Every day | ORAL | Status: DC
  Administered 2021-05-03 – 2021-05-05 (×3): 100 mg via ORAL
  Filled 2021-05-03 (×3): qty 1

## 2021-05-03 MED ORDER — LIDOCAINE 2% (20 MG/ML) 5 ML SYRINGE
INTRAMUSCULAR | Status: DC | PRN
  Administered 2021-05-03: 40 mg via INTRAVENOUS

## 2021-05-03 MED ORDER — PRAZOSIN HCL 1 MG PO CAPS
2.0000 mg | ORAL_CAPSULE | Freq: Every day | ORAL | Status: DC
  Administered 2021-05-03 – 2021-05-04 (×2): 2 mg via ORAL
  Filled 2021-05-03 (×2): qty 2
  Filled 2021-05-03: qty 1

## 2021-05-03 MED ORDER — UMECLIDINIUM BROMIDE 62.5 MCG/INH IN AEPB
1.0000 | INHALATION_SPRAY | Freq: Every day | RESPIRATORY_TRACT | Status: DC
  Administered 2021-05-05: 1 via RESPIRATORY_TRACT
  Filled 2021-05-03 (×2): qty 7

## 2021-05-03 MED ORDER — OXYBUTYNIN CHLORIDE 5 MG PO TABS
5.0000 mg | ORAL_TABLET | Freq: Three times a day (TID) | ORAL | Status: DC
  Administered 2021-05-03 – 2021-05-05 (×6): 5 mg via ORAL
  Filled 2021-05-03 (×6): qty 1

## 2021-05-03 MED ORDER — LACTULOSE 10 GM/15ML PO SOLN
30.0000 g | Freq: Once | ORAL | Status: AC
  Administered 2021-05-03: 30 g via ORAL
  Filled 2021-05-03: qty 60

## 2021-05-03 MED ORDER — SODIUM CHLORIDE 0.9 % IV SOLN
1.0000 g | INTRAVENOUS | Status: DC
  Administered 2021-05-03 – 2021-05-04 (×2): 1 g via INTRAVENOUS
  Filled 2021-05-03: qty 1
  Filled 2021-05-03: qty 10

## 2021-05-03 MED ORDER — QUETIAPINE FUMARATE 100 MG PO TABS
300.0000 mg | ORAL_TABLET | Freq: Every day | ORAL | Status: DC
  Administered 2021-05-03 – 2021-05-04 (×2): 300 mg via ORAL
  Filled 2021-05-03 (×2): qty 3
  Filled 2021-05-03: qty 1

## 2021-05-03 MED ORDER — ALBUTEROL SULFATE HFA 108 (90 BASE) MCG/ACT IN AERS
2.0000 | INHALATION_SPRAY | Freq: Four times a day (QID) | RESPIRATORY_TRACT | Status: DC | PRN
  Administered 2021-05-04: 2 via RESPIRATORY_TRACT
  Filled 2021-05-03: qty 6.7

## 2021-05-03 MED ORDER — MEPERIDINE HCL 50 MG/ML IJ SOLN: 6.2500 mg | INTRAMUSCULAR | Status: DC | PRN

## 2021-05-03 MED ORDER — PANTOPRAZOLE SODIUM 40 MG PO TBEC
40.0000 mg | DELAYED_RELEASE_TABLET | Freq: Every day | ORAL | Status: DC
  Administered 2021-05-03 – 2021-05-05 (×3): 40 mg via ORAL
  Filled 2021-05-03 (×3): qty 1

## 2021-05-03 MED ORDER — BUSPIRONE HCL 5 MG PO TABS
5.0000 mg | ORAL_TABLET | Freq: Three times a day (TID) | ORAL | Status: DC
  Administered 2021-05-03 – 2021-05-05 (×6): 5 mg via ORAL
  Filled 2021-05-03 (×7): qty 1

## 2021-05-03 MED ORDER — PROMETHAZINE HCL 25 MG/ML IJ SOLN: 6.2500 mg | INTRAMUSCULAR | Status: DC | PRN

## 2021-05-03 MED ORDER — MIDAZOLAM HCL 2 MG/2ML IJ SOLN: 0.5000 mg | Freq: Once | INTRAMUSCULAR | Status: DC | PRN

## 2021-05-03 MED ORDER — TAMSULOSIN HCL 0.4 MG PO CAPS
0.4000 mg | ORAL_CAPSULE | Freq: Every day | ORAL | Status: DC
  Administered 2021-05-03 – 2021-05-05 (×3): 0.4 mg via ORAL
  Filled 2021-05-03 (×3): qty 1

## 2021-05-03 MED ORDER — LEVOTHYROXINE SODIUM 25 MCG PO TABS
137.0000 ug | ORAL_TABLET | Freq: Every day | ORAL | Status: DC
  Administered 2021-05-04 – 2021-05-05 (×2): 137 ug via ORAL
  Filled 2021-05-03 (×2): qty 1

## 2021-05-03 MED ORDER — SUCCINYLCHOLINE CHLORIDE 200 MG/10ML IV SOSY
PREFILLED_SYRINGE | INTRAVENOUS | Status: DC | PRN
  Administered 2021-05-03: 100 mg via INTRAVENOUS

## 2021-05-03 MED ORDER — FENTANYL CITRATE (PF) 100 MCG/2ML IJ SOLN: 25.0000 ug | INTRAMUSCULAR | Status: DC | PRN

## 2021-05-03 MED ORDER — STERILE WATER FOR IRRIGATION IR SOLN
Status: DC | PRN
  Administered 2021-05-03: 3000 mL

## 2021-05-03 MED ORDER — LACTATED RINGERS IV SOLN: INTRAVENOUS | Status: DC

## 2021-05-03 MED ORDER — FAMOTIDINE 20 MG PO TABS: 40.0000 mg | ORAL_TABLET | Freq: Every day | ORAL | Status: DC | PRN

## 2021-05-03 MED ORDER — DEXAMETHASONE SODIUM PHOSPHATE 10 MG/ML IJ SOLN
INTRAMUSCULAR | Status: DC | PRN
  Administered 2021-05-03: 5 mg via INTRAVENOUS

## 2021-05-03 MED ORDER — ROSUVASTATIN CALCIUM 10 MG PO TABS
20.0000 mg | ORAL_TABLET | Freq: Every day | ORAL | Status: DC
  Administered 2021-05-03 – 2021-05-05 (×3): 20 mg via ORAL
  Filled 2021-05-03: qty 2
  Filled 2021-05-03: qty 1
  Filled 2021-05-03: qty 2

## 2021-05-03 MED ORDER — IOHEXOL 300 MG/ML  SOLN
INTRAMUSCULAR | Status: DC | PRN
  Administered 2021-05-03: 5 mL

## 2021-05-03 SURGICAL SUPPLY — 18 items
BAG URO CATCHER STRL LF (MISCELLANEOUS) ×2 IMPLANT
BASKET ZERO TIP NITINOL 2.4FR (BASKET) IMPLANT
BSKT STON RTRVL ZERO TP 2.4FR (BASKET)
CATH URET 5FR 28IN OPEN ENDED (CATHETERS) ×2 IMPLANT
CLOTH BEACON ORANGE TIMEOUT ST (SAFETY) ×2 IMPLANT
EXTRACTOR STONE 1.7FRX115CM (UROLOGICAL SUPPLIES) IMPLANT
GLOVE SURG ENC TEXT LTX SZ7.5 (GLOVE) ×2 IMPLANT
GOWN STRL REUS W/TWL XL LVL3 (GOWN DISPOSABLE) ×2 IMPLANT
GUIDEWIRE ANG ZIPWIRE 038X150 (WIRE) IMPLANT
GUIDEWIRE STR DUAL SENSOR (WIRE) ×2 IMPLANT
KIT TURNOVER KIT A (KITS) ×2 IMPLANT
MANIFOLD NEPTUNE II (INSTRUMENTS) ×2 IMPLANT
PACK CYSTO (CUSTOM PROCEDURE TRAY) ×2 IMPLANT
SHEATH URETERAL 12FRX28CM (UROLOGICAL SUPPLIES) IMPLANT
SHEATH URETERAL 12FRX35CM (MISCELLANEOUS) IMPLANT
STENT URET 6FRX24 CONTOUR (STENTS) ×1 IMPLANT
TUBING CONNECTING 10 (TUBING) ×2 IMPLANT
TUBING UROLOGY SET (TUBING) ×1 IMPLANT

## 2021-05-03 NOTE — ED Notes (Signed)
Pt woke up, talking to daughter on the phone. Doesn't remember the reason why she's here. Alert, oriented to self, place only. States there's "chemical" in her backyard that "may have caused this".

## 2021-05-03 NOTE — Anesthesia Preprocedure Evaluation (Signed)
Anesthesia Evaluation  Patient identified by MRN, date of birth, ID band Patient awake    Reviewed: Allergy & Precautions, NPO status , Patient's Chart, lab work & pertinent test results  History of Anesthesia Complications Negative for: history of anesthetic complications  Airway Mallampati: I  TM Distance: >3 FB Neck ROM: Full    Dental  (+) Edentulous Upper, Edentulous Lower   Pulmonary COPD,  COPD inhaler and oxygen dependent, Current Smoker,    breath sounds clear to auscultation       Cardiovascular (-) angina+ Past MI (? "pt says returned to normal")   Rhythm:Regular Rate:Normal  '13 Stress: Normal myocardial perfusion study. Left ventricular ejection fraction is likely overestimated at 80% due to low end-systolic counting statistics   Neuro/Psych PSYCHIATRIC DISORDERS (PTSD) Anxiety Depression Bipolar Disorder negative neurological ROS     GI/Hepatic Neg liver ROS, GERD  Medicated,N/v with this stone   Endo/Other  Hypothyroidism   Renal/GU Stones: hydronephrosis     Musculoskeletal   Abdominal   Peds  Hematology negative hematology ROS (+)   Anesthesia Other Findings   Reproductive/Obstetrics                             Anesthesia Physical Anesthesia Plan  ASA: 3  Anesthesia Plan: General   Post-op Pain Management:    Induction: Intravenous and Rapid sequence  PONV Risk Score and Plan: 2 and Ondansetron and Dexamethasone  Airway Management Planned: Oral ETT  Additional Equipment: None  Intra-op Plan:   Post-operative Plan: Extubation in OR  Informed Consent: I have reviewed the patients History and Physical, chart, labs and discussed the procedure including the risks, benefits and alternatives for the proposed anesthesia with the patient or authorized representative who has indicated his/her understanding and acceptance.       Plan Discussed with: CRNA and  Surgeon  Anesthesia Plan Comments:         Anesthesia Quick Evaluation

## 2021-05-03 NOTE — Op Note (Signed)
Preoperative diagnosis:  Right obstructing stone Altered mental status   Postoperative diagnosis:  same   Procedure:  Cystoscopy right ureteral stent placement right retrograde pyelography with interpretation   Surgeon: Ardis Hughs, MD  Anesthesia: General  Complications: None  Intraoperative findings:  right retrograde pyelography demonstrated a filling defect within the right ureter consistent with the patient's known calculus without other abnormalities.  EBL: Minimal  Specimens: None  Indication: [facility] is a 65 y.o. patient with obstructing right ureteral stone and altered mental status. After reviewing the management options for treatment, he elected to proceed with the above surgical procedure(s). We have discussed the potential benefits and risks of the procedure, side effects of the proposed treatment, the likelihood of the patient achieving the goals of the procedure, and any potential problems that might occur during the procedure or recuperation. Informed consent has been obtained.  Description of procedure:  The patient was taken to the operating room and general anesthesia was induced.  The patient was placed in the dorsal lithotomy position, prepped and draped in the usual sterile fashion, and preoperative antibiotics were administered. A preoperative time-out was performed.   Cystourethroscopy was performed.  The patient's urethra was examined and was normal. The bladder was then systematically examined in its entirety. There was no evidence for any bladder tumors, stones, or other mucosal pathology.    Attention then turned to the rightureteral orifice and a ureteral catheter was used to intubate the ureteral orifice.  Omnipaque contrast was injected through the ureteral catheter and a retrograde pyelogram was performed with findings as dictated above.  A 0.38 sensor guidewire was then advanced up the right ureter into the renal pelvis under  fluoroscopic guidance.  The wire was then backloaded through the cystoscope and a ureteral stent was advance over the wire using Seldinger technique.  The stent was positioned appropriately under fluoroscopic and cystoscopic guidance.  The wire was then removed with an adequate stent curl noted in the renal pelvis as well as in the bladder.  The bladder was then emptied and the procedure ended.  The patient appeared to tolerate the procedure well and without complications.  The patient was able to be awakened and transferred to the recovery unit in satisfactory condition.    Ardis Hughs, M.D.

## 2021-05-03 NOTE — Anesthesia Postprocedure Evaluation (Signed)
Anesthesia Post Note  Patient: [facility]  Procedure(s) Performed: CYSTOSCOPY WITH RETROGRADE PYELOGRAM/URETERAL STENT PLACEMENT (Right)     Patient location during evaluation: PACU Anesthesia Type: General Level of consciousness: awake and alert, patient cooperative and oriented Pain management: pain level controlled Vital Signs Assessment: post-procedure vital signs reviewed and stable Respiratory status: spontaneous breathing, nonlabored ventilation, respiratory function stable and patient connected to nasal cannula oxygen Cardiovascular status: blood pressure returned to baseline and stable Postop Assessment: no apparent nausea or vomiting Anesthetic complications: no   No notable events documented.  Last Vitals:  Vitals:   05/03/21 1900 05/03/21 1912  BP: 107/70   Pulse: 69 63  Resp: (!) 23 (!) 27  Temp:    SpO2: 100% 100%    Last Pain:  Vitals:   05/03/21 1900  TempSrc:   PainSc: 0-No pain                 Kenedie Dirocco,E. Aerilynn Goin

## 2021-05-03 NOTE — Progress Notes (Addendum)
PROGRESS NOTE    Tracy Park  MEQ:[id number] DOB: [birth year] DOA: 05/02/2021 PCP: [name], PA   Chief Complaint  Patient presents with   Altered Mental Status    Brief Narrative:   Tracy Park is Tracy Park 65 y.o. female with history of COPD, hypothyroidism, lung cancer status postchemotherapy 2 years ago, depression was found to be confused by the family and EMS was called.  Does not know not how long the patient was confused.  ED Course: In the ER patient appeared confused initially mildly lethargic was given Narcan following which patient became more alert awake.  Pupils are slightly dilated but reacting.  CT head unremarkable.  Labs show ammonia levels are slightly high at 61.  ABG does not show any carbon dioxide retention.  WBC is 12.3.  Lactic acid normal.  COVID test negative.  Chest x-ray unremarkable.  EKG shows sinus bradycardia at 46 bpm.  Assessment & Plan:   Principal Problem:   Acute encephalopathy Active Problems:   Hypothyroidism (acquired)   Depression   Hydronephrosis  Acute metabolic encephalopathy  Unclear cause, unclear baseline at this point - ddx includes UTI with obstructing stone +/- related to opiates with response to narcan  Possibly UTI with obstructing stone - urology has been consulted, planning to transfer to [facility] for likely need for intervention UDS positive for THC Ammonia mildly elevated, follow repeat TSH wnl Reportedly responded to narcan as well Head ct without acute change, mild senescent change  Needs med rec when able to discuss/confirm meds with family - will ask pharmacy for assistance Delirium precautions  Right Sided Hydronephrosis  6 mm obstructing calculus UA not clearly indicative of UTI, but in setting of encephalopathy above, treat with abx Discussed with urology, transfer to [facility] - unable to reach family at this time to discuss, will continue to work on this - discussed with RN Follow urine culture,  ceftriaxone  Sinus bradycardia.  Cause not clear patient is not on any rate limiting medications per the discharge summary 3 weeks ago.  Will check TSH - wnl.  Check urine drug screen.  Closely monitor. Hemodynamically stable, continue to monitor  History of depression  Continue pristiq, buspar, Seroquel, zoloft, minipress   COPD not actively wheezing. Resume home inhalers  History of postoperative hypothyroidism on Synthroid, TSH wnl  History of lung cancer status post chemo and surgery.  History of sleep apnea.  9.  Bladder Spasms  1. Continue oxybutynin, flomax  10. Dyslipidema  1. statin   Will need to get further history from family when available.  Unable to reach family know.  Daughter does not have number listed, sig other did not answer.   DVT prophylaxis: SCD Code Status: full Family Communication: none at bedside - called sig other, no answer Disposition:   Status is: Inpatient  Remains inpatient appropriate because:Inpatient level of care appropriate due to severity of illness  Dispo: The patient is from: Home              Anticipated d/c is to: Home              Patient currently is not medically stable to d/c.   Difficult to place patient No       Consultants:  urology  Procedures: none  Antimicrobials: Anti-infectives (From admission, onward)    Start     Dose/Rate Route Frequency Ordered Stop   05/03/21 0900  cefTRIAXone (ROCEPHIN) 1 g in sodium chloride 0.9 % 100 mL  IVPB        1 g 200 mL/hr over 30 Minutes Intravenous Every 24 hours 05/03/21 0852             Subjective: Confused   Objective: Vitals:   05/03/21 0400 05/03/21 0500 05/03/21 0600 05/03/21 0815  BP: 106/68 126/64 100/61 (!) 105/53  Pulse: (!) 47 63 (!) 57 (!) 51  Resp: 16 15 20 13   Temp: 98.6 F (37 C)   98.3 F (36.8 C)  TempSrc: Oral   Oral  SpO2: 96% 97% 94% 98%    Intake/Output Summary (Last 24 hours) at 05/03/2021 0915 Last data filed at 05/03/2021  0138 Gross per 24 hour  Intake --  Output 700 ml  Net -700 ml   There were no vitals filed for this visit.  Examination:  General exam: Appears calm and comfortable  Respiratory system: Clear to auscultation. Respiratory effort normal. Cardiovascular system: S1 & S2 heard, RRR.  Gastrointestinal system: abdomen is tender, she pushes my hand away Central nervous system: confused, disoriented - initially sleeping soundly, but on awakening, confused - doesn't consistently answer questions appropriately - moving all extremities Extremities: no LEE Skin: No rashes, lesions or ulcers Psychiatry: Judgement and insight appear normal. Mood & affect appropriate.     Data Reviewed: I have personally reviewed following labs and imaging studies  CBC: Recent Labs  Lab 05/02/21 1929 05/02/21 1955 05/02/21 2045 05/03/21 0048  WBC 12.3*  --   --  9.3  NEUTROABS 9.7*  --   --  7.1  HGB 14.2 15.0 14.3 12.5  HCT 43.9 44.0 42.0 39.4  MCV 93.2  --   --  92.1  PLT 257  --   --  834    Basic Metabolic Panel: Recent Labs  Lab 05/02/21 1929 05/02/21 1955 05/02/21 2045 05/03/21 0048  NA 142 143 142 143  K 3.7 3.7 3.7 4.2  CL 105 108  --  105  CO2 26  --   --  28  GLUCOSE 127* 127*  --  130*  BUN 16 18  --  24*  CREATININE 0.73 0.60  --  0.70  CALCIUM 10.0  --   --  9.6    GFR: CrCl cannot be calculated (Unknown ideal weight.).  Liver Function Tests: Recent Labs  Lab 05/02/21 1929 05/03/21 0048  AST 20 17  ALT 13 13  ALKPHOS 78 74  BILITOT 0.8 0.8  PROT 7.8 7.2  ALBUMIN 4.1 3.6    CBG: Recent Labs  Lab 05/02/21 2004 05/03/21 0817  GLUCAP 116* 117*     Recent Results (from the past 240 hour(s))  Resp Panel by RT-PCR (Flu Kaire Stary&B, Covid) Nasopharyngeal Swab     Status: None   Collection Time: 05/02/21  8:00 PM   Specimen: Nasopharyngeal Swab; Nasopharyngeal(NP) swabs in vial transport medium  Result Value Ref Range Status   SARS Coronavirus 2 by RT PCR NEGATIVE  NEGATIVE Final    Comment: (NOTE) SARS-CoV-2 target nucleic acids are NOT DETECTED.  The SARS-CoV-2 RNA is generally detectable in upper respiratory specimens during the acute phase of infection. The lowest concentration of SARS-CoV-2 viral copies this assay can detect is 138 copies/mL. Azariel Banik negative result does not preclude SARS-Cov-2 infection and should not be used as the sole basis for treatment or other patient management decisions. Stan Cantave negative result may occur with  improper specimen collection/handling, submission of specimen other than nasopharyngeal swab, presence of viral mutation(s) within the areas targeted by this  assay, and inadequate number of viral copies(<138 copies/mL). Caroleann Casler negative result must be combined with clinical observations, patient history, and epidemiological information. The expected result is Negative.  Fact Sheet for Patients:  EntrepreneurPulse.com.au  Fact Sheet for Healthcare Providers:  IncredibleEmployment.be  This test is no t yet approved or cleared by the [place] FDA and  has been authorized for detection and/or diagnosis of SARS-CoV-2 by FDA under an Emergency Use Authorization (EUA). This EUA will remain  in effect (meaning this test can be used) for the duration of the COVID-19 declaration under Section 564(b)(1) of the Act, 21 U.S.C.section 360bbb-3(b)(1), unless the authorization is terminated  or revoked sooner.       Influenza Jakerra Floyd by PCR NEGATIVE NEGATIVE Final   Influenza B by PCR NEGATIVE NEGATIVE Final    Comment: (NOTE) The Xpert Xpress SARS-CoV-2/FLU/RSV plus assay is intended as an aid in the diagnosis of influenza from Nasopharyngeal swab specimens and should not be used as [place] sole basis for treatment. Nasal washings and aspirates are unacceptable for Xpert Xpress SARS-CoV-2/FLU/RSV testing.  Fact Sheet for Patients: EntrepreneurPulse.com.au  Fact Sheet for Healthcare  Providers: IncredibleEmployment.be  This test is not yet approved or cleared by the [place] FDA and has been authorized for detection and/or diagnosis of SARS-CoV-2 by FDA under an Emergency Use Authorization (EUA). This EUA will remain in effect (meaning this test can be used) for the duration of the COVID-19 declaration under Section 564(b)(1) of the Act, 21 U.S.C. section 360bbb-3(b)(1), unless the authorization is terminated or revoked.  Performed at [facility], [place] [address] [place], [place] [address]          Radiology Studies: CT Head Wo Contrast  Result Date: 05/02/2021 CLINICAL DATA:  Altered mental status EXAM: CT HEAD WITHOUT CONTRAST TECHNIQUE: Contiguous axial images were obtained from the base of the skull through the vertex without intravenous contrast. COMPARISON:  None. FINDINGS: Brain: Normal anatomic configuration. Parenchymal volume loss is commensurate with the patient's age. Mild periventricular white matter changes are present likely reflecting the sequela of small vessel ischemia. There are punctate left frontal periventricular calcifications which may reflect the sequela of remote infection or inflammation. No abnormal intra or extra-axial mass lesion or fluid collection. No abnormal mass effect or midline shift. No evidence of acute intracranial hemorrhage or infarct. Ventricular size is normal. Cerebellum unremarkable. Vascular: No asymmetric hyperdense vasculature at the skull base. Skull: Intact Sinuses/Orbits: Paranasal sinuses are clear. Orbits are unremarkable. Other: Mastoid air cells and middle ear cavities are clear. IMPRESSION: No acute intracranial hemorrhage or infarct. Mild senescent change. Electronically Signed   By: Fidela Salisbury M.D.   On: 05/02/2021 20:39   DG Chest Portable 1 View  Result Date: 05/02/2021 CLINICAL DATA:  Dyspnea, confusion EXAM: PORTABLE CHEST 1 VIEW COMPARISON:  04/09/2020 FINDINGS: Single  frontal view of the chest demonstrates Divya Munshi stable cardiac silhouette. There are postsurgical changes from prior left upper lobectomy, with associated left-sided volume loss. Chronic background scarring and emphysema is again noted. No acute airspace disease, effusion, or pneumothorax. There are no acute bony abnormalities. IMPRESSION: 1. Chronic postsurgical changes from left upper lobectomy. 2. Chronic emphysema.  No acute airspace disease. Electronically Signed   By: Randa Ngo M.D.   On: 05/02/2021 20:03   CT RENAL STONE STUDY  Result Date: 05/03/2021 CLINICAL DATA:  65 year old female with history of flank pain. Suspected kidney stone. EXAM: CT ABDOMEN AND PELVIS WITHOUT CONTRAST TECHNIQUE: Multidetector CT imaging of the abdomen  and pelvis was performed following the standard protocol without IV contrast. COMPARISON:  CT the abdomen and pelvis 11/06/2009. FINDINGS: Lower chest: Emphysematous changes are noted throughout the visualized lung bases. Multiple tiny 2-3 mm micronodules are noted in the periphery of the lungs bilaterally, nonspecific, but statistically likely to represent areas of mucoid impaction within terminal bronchioles. Atherosclerotic calcifications in the thoracic aorta. Hepatobiliary: No definite suspicious cystic or solid hepatic lesions are confidently identified on today's noncontrast CT examination. Unenhanced appearance of the gallbladder is normal. Pancreas: No definite pancreatic mass or peripancreatic fluid collections or inflammatory changes are noted on today's noncontrast CT examination. Spleen: Unremarkable. Adrenals/Urinary Tract: 5 mm nonobstructive calculus in the lower pole collecting system of the right kidney. 2 mm nonobstructive calculus in the lower pole collecting system of left kidney. In addition, in the proximal third of the right ureter (axial image 37 of series 3) there is Londan Coplen 6 mm calculus which is associated with mild-to-moderate proximal right  hydroureteronephrosis. No additional ureteral calculi or bladder calculi are confidently identified. No left hydroureteronephrosis. Unenhanced appearance of the kidneys, bilateral adrenal glands and urinary bladder is otherwise unremarkable. Stomach/Bowel: Unenhanced appearance of the stomach is normal. No pathologic dilatation of small bowel or colon. The appendix is not confidently identified and may be surgically absent. Regardless, there are no inflammatory changes noted adjacent to the cecum to suggest the presence of an acute appendicitis at this time. Vascular/Lymphatic: Aortic atherosclerosis. No definite lymphadenopathy confidently identified in the abdomen or pelvis on today's noncontrast CT examination (assessment is limited by lack of intra-abdominal and retroperitoneal fat). Reproductive: Status post hysterectomy. Ovaries are not confidently identified may be surgically absent or atrophic. Other: No significant volume of ascites.  No pneumoperitoneum. Musculoskeletal: There are no aggressive appearing lytic or blastic lesions noted in the visualized portions of the skeleton. IMPRESSION: 1. 6 mm calculus in the proximal third of the right ureter with mild to moderate proximal right hydroureteronephrosis indicating obstruction at this time. 2. Additional nonobstructive calculi in the collecting systems of both kidneys measuring up to 5 mm in the lower pole collecting system of the right kidney. 3. Emphysema. 4. Tiny 2-3 mm micronodules in the periphery of the lung bases bilaterally, nonspecific, but strongly favored to represent benign areas of mucoid impaction within terminal bronchioles. No follow-up needed if patient is low-risk (and has no known or suspected primary neoplasm). Non-contrast chest CT can be considered in 12 months if patient is high-risk. This recommendation follows the consensus statement: Guidelines for Management of Incidental Pulmonary Nodules Detected on CT Images: From the  Fleischner Society 2017; Radiology 2017; 284:228-243. 5. Aortic atherosclerosis. Electronically Signed   By: Vinnie Langton M.D.   On: 05/03/2021 06:57   US Abdomen Limited RUQ (LIVER/GB)  Result Date: 05/03/2021 CLINICAL DATA:  Right upper quadrant pain EXAM: ULTRASOUND ABDOMEN LIMITED RIGHT UPPER QUADRANT COMPARISON:  None. FINDINGS: Gallbladder: No gallstones or wall thickening visualized. No sonographic Murphy sign noted by sonographer. Common bile duct: Diameter: 2.9 mm Liver: No focal lesion identified. Within normal limits in parenchymal echogenicity. Portal vein is patent on color Doppler imaging with normal direction of blood flow towards the liver. Other: Incidental note is made of right-sided hydronephrosis. IMPRESSION: Right-sided hydronephrosis. Otherwise unremarkable right upper quadrant ultrasound. Electronically Signed   By: Inez Catalina M.D.   On: 05/03/2021 02:24        Scheduled Meds:  lactulose  30 g Oral Once   Continuous Infusions:  cefTRIAXone (ROCEPHIN)  IV  lactated ringers 100 mL/hr at 05/03/21 1281     LOS: 0 days    Time spent: over 30 min    Fayrene Helper, MD [facility]   To contact the attending provider between 7A-7P or the covering provider during after hours 7P-7A, please log into the web site [email or web address] and access using universal [place] password for that web site. If you do not have the password, please call the hospital operator.  05/03/2021, 9:15 AM

## 2021-05-03 NOTE — ED Notes (Signed)
Pt up crying for mama. Unable to tell us of her age and name. Pt very anxious. Multiple attempts fr redirection with memory laps. MD contacted.

## 2021-05-03 NOTE — ED Notes (Signed)
[facility] was called

## 2021-05-03 NOTE — H&P (Addendum)
History and Physical    Tracy Park RJJ:[id number] DOB: 08/27/1956 DOA: 05/02/2021  PCP: [name], PA  Patient coming from: Home.  History obtained from ER physician and previous records.  Unable to reach family.  Chief Complaint: Confusion.  HPI: Tracy Park is a 65 y.o. female with history of COPD, hypothyroidism, lung cancer status postchemotherapy 2 years ago, depression who was admitted about 3 weeks ago for pneumonia was found to be confused by the family and EMS was called.  Does not know not how long the patient was confused.  ED Course: In the ER patient appeared confused initially mildly lethargic was given Narcan following which patient became more alert awake.  Pupils are slightly dilated but reacting.  CT head unremarkable.  Labs show ammonia levels are slightly high at 61.  ABG does not show any carbon dioxide retention.  WBC is 12.3.  Lactic acid normal.  COVID test negative.  Chest x-ray unremarkable.  EKG shows sinus bradycardia at 46 bpm.  Review of Systems: As per HPI, rest all negative.   Past Medical History:  Diagnosis Date   COPD (chronic obstructive pulmonary disease) ([place])    Hypothyroidism    Thyroid disease     Past Surgical History:  Procedure Laterality Date   kidney stent     thyroidectomy       reports that she has been smoking cigarettes. She has never used smokeless tobacco. She reports that she does not currently use alcohol. She reports that she does not currently use drugs.  Allergies  Allergen Reactions   Red Dye Hives and Itching   Aspirin Hives   Tape Rash    Family History  Family history unknown: Yes    Prior to Admission medications   Medication Sig Start Date End Date Taking? Authorizing Provider  albuterol (VENTOLIN HFA) 108 (90 Base) MCG/ACT inhaler Inhale 2 puffs into the lungs every 6 (six) hours as needed for wheezing or shortness of breath.  03/23/20   [provider]  buPROPion (WELLBUTRIN  XL) 300 MG 24 hr tablet Take 300 mg by mouth daily after breakfast. 01/18/20   [provider]  famotidine (PEPCID) 40 MG tablet Take 40 mg by mouth daily as needed for heartburn or indigestion.  02/14/20   [provider]  HYDROcodone-homatropine (HYCODAN) 5-1.5 MG/5ML syrup Take 5 mLs by mouth every 6 (six) hours as needed for cough. 04/12/20   Dwyane Dee, MD  levothyroxine (SYNTHROID) 137 MCG tablet Take 137 mcg by mouth daily before breakfast. 03/14/20   [provider]  meloxicam (MOBIC) 15 MG tablet Take 15 mg by mouth daily. 04/07/20   [provider]  methocarbamol (ROBAXIN) 500 MG tablet Take 500 mg by mouth every 12 (twelve) hours as needed for muscle spasms.  02/10/20   [provider]  omeprazole (PRILOSEC) 20 MG capsule Take 40 mg by mouth daily.    [provider]  ondansetron (ZOFRAN) 4 MG tablet Take 4 mg by mouth every 8 (eight) hours as needed for nausea or vomiting.  03/10/20   [provider]  oxybutynin (DITROPAN) 5 MG tablet Take 5 mg by mouth 3 (three) times daily. 03/15/20   [provider]  prazosin (MINIPRESS) 2 MG capsule Take 2 mg by mouth at bedtime. 01/11/20   [provider]  QUEtiapine (SEROQUEL) 200 MG tablet Take 200 mg by mouth at bedtime. 01/18/20   [provider]  rosuvastatin (CRESTOR) 20 MG tablet Take  20 mg by mouth daily. 01/15/20   [provider]  tamsulosin (FLOMAX) 0.4 MG CAPS capsule Take 0.4 mg by mouth daily. 03/21/20   [provider]    Physical Exam: Constitutional: Moderately built and nourished. Vitals:   05/02/21 2000 05/02/21 2115 05/02/21 2130 05/02/21 2200  BP:  (!) 144/94 138/82 132/90  Pulse: (!) 41  (!) 42 87  Resp:  15 17 16   Temp:    98.3 F (36.8 C)  TempSrc:    Axillary  SpO2: 96%  95% 97%   Eyes: Anicteric no pallor. ENMT: No discharge from the ears eyes nose and mouth. Neck: No mass felt.  No neck rigidity. Respiratory: No rhonchi  or crepitations. Cardiovascular: S1-S2 heard. Abdomen: Soft nontender bowel sound present. Musculoskeletal: No edema. Skin: No rash. Neurologic: Alert awake oriented to name.  Moving all extremities.  Pupils are mildly dilated. Psychiatric: Appears confused.   Labs on Admission: I have personally reviewed following labs and imaging studies  CBC: Recent Labs  Lab 05/02/21 1929 05/02/21 1955 05/02/21 2045  WBC 12.3*  --   --   NEUTROABS 9.7*  --   --   HGB 14.2 15.0 14.3  HCT 43.9 44.0 42.0  MCV 93.2  --   --   PLT 257  --   --    Basic Metabolic Panel: Recent Labs  Lab 05/02/21 1929 05/02/21 1955 05/02/21 2045  NA 142 143 142  K 3.7 3.7 3.7  CL 105 108  --   CO2 26  --   --   GLUCOSE 127* 127*  --   BUN 16 18  --   CREATININE 0.73 0.60  --   CALCIUM 10.0  --   --    GFR: CrCl cannot be calculated (Unknown ideal weight.). Liver Function Tests: Recent Labs  Lab 05/02/21 1929  AST 20  ALT 13  ALKPHOS 78  BILITOT 0.8  PROT 7.8  ALBUMIN 4.1   Recent Labs  Lab 05/02/21 1929  LIPASE 23   Recent Labs  Lab 05/02/21 1929  AMMONIA 61*   Coagulation Profile: Recent Labs  Lab 05/02/21 1929  INR 1.0   Cardiac Enzymes: Recent Labs  Lab 05/02/21 1929  CKTOTAL 101   BNP (last 3 results) No results for input(s): PROBNP in the last 8760 hours. HbA1C: No results for input(s): HGBA1C in the last 72 hours. CBG: Recent Labs  Lab 05/02/21 2004  GLUCAP 116*   Lipid Profile: No results for input(s): CHOL, HDL, LDLCALC, TRIG, CHOLHDL, LDLDIRECT in the last 72 hours. Thyroid Function Tests: No results for input(s): TSH, T4TOTAL, FREET4, T3FREE, THYROIDAB in the last 72 hours. Anemia Panel: No results for input(s): VITAMINB12, FOLATE, FERRITIN, TIBC, IRON, RETICCTPCT in the last 72 hours. Urine analysis:    Component Value Date/Time   COLORURINE YELLOW 11/06/2009 1559   APPEARANCEUR CLEAR 11/06/2009 1559   LABSPEC 1.011 11/06/2009 1559   PHURINE 6.0  11/06/2009 1559   GLUCOSEU NEGATIVE 11/06/2009 1559   HGBUR NEGATIVE 11/06/2009 1559   BILIRUBINUR NEGATIVE 11/06/2009 1559   KETONESUR NEGATIVE 11/06/2009 1559   PROTEINUR NEGATIVE 11/06/2009 1559   UROBILINOGEN 0.2 11/06/2009 1559   NITRITE NEGATIVE 11/06/2009 1559   LEUKOCYTESUR  11/06/2009 1559    NEGATIVE MICROSCOPIC NOT DONE ON URINES WITH NEGATIVE PROTEIN, BLOOD, LEUKOCYTES, NITRITE, OR GLUCOSE <1000 mg/dL.   Sepsis Labs: @LABRCNTIP (procalcitonin:4,lacticidven:4) ) Recent Results (from the past 240 hour(s))  Resp Panel by RT-PCR (Flu A&B, Covid) Nasopharyngeal Swab  Status: None   Collection Time: 05/02/21  8:00 PM   Specimen: Nasopharyngeal Swab; Nasopharyngeal(NP) swabs in vial transport medium  Result Value Ref Range Status   SARS Coronavirus 2 by RT PCR NEGATIVE NEGATIVE Final    Comment: (NOTE) SARS-CoV-2 target nucleic acids are NOT DETECTED.  The SARS-CoV-2 RNA is generally detectable in upper respiratory specimens during the acute phase of infection. The lowest concentration of SARS-CoV-2 viral copies this assay can detect is 138 copies/mL. A negative result does not preclude SARS-Cov-2 infection and should not be used as the sole basis for treatment or other patient management decisions. A negative result may occur with  improper specimen collection/handling, submission of specimen other than nasopharyngeal swab, presence of viral mutation(s) within the areas targeted by this assay, and inadequate number of viral copies(<138 copies/mL). A negative result must be combined with clinical observations, patient history, and epidemiological information. The expected result is Negative.  Fact Sheet for Patients:  EntrepreneurPulse.com.au  Fact Sheet for Healthcare Providers:  IncredibleEmployment.be  This test is no t yet approved or cleared by the [place] FDA and  has been authorized for detection and/or diagnosis of  SARS-CoV-2 by FDA under an Emergency Use Authorization (EUA). This EUA will remain  in effect (meaning this test can be used) for the duration of the COVID-19 declaration under Section 564(b)(1) of the Act, 21 U.S.C.section 360bbb-3(b)(1), unless the authorization is terminated  or revoked sooner.       Influenza A by PCR NEGATIVE NEGATIVE Final   Influenza B by PCR NEGATIVE NEGATIVE Final    Comment: (NOTE) The Xpert Xpress SARS-CoV-2/FLU/RSV plus assay is intended as an aid in the diagnosis of influenza from Nasopharyngeal swab specimens and should not be used as a sole basis for treatment. Nasal washings and aspirates are unacceptable for Xpert Xpress SARS-CoV-2/FLU/RSV testing.  Fact Sheet for Patients: EntrepreneurPulse.com.au  Fact Sheet for Healthcare Providers: IncredibleEmployment.be  This test is not yet approved or cleared by the [place] FDA and has been authorized for detection and/or diagnosis of SARS-CoV-2 by FDA under an Emergency Use Authorization (EUA). This EUA will remain in effect (meaning this test can be used) for the duration of the COVID-19 declaration under Section 564(b)(1) of the Act, 21 U.S.C. section 360bbb-3(b)(1), unless the authorization is terminated or revoked.  Performed at [facility], [place] [address], [place], [place] [address]      Radiological Exams on Admission: CT Head Wo Contrast  Result Date: 05/02/2021 CLINICAL DATA:  Altered mental status EXAM: CT HEAD WITHOUT CONTRAST TECHNIQUE: Contiguous axial images were obtained from the base of the skull through the vertex without intravenous contrast. COMPARISON:  None. FINDINGS: Brain: Normal anatomic configuration. Parenchymal volume loss is commensurate with the patient's age. Mild periventricular white matter changes are present likely reflecting the sequela of small vessel ischemia. There are punctate left frontal periventricular  calcifications which may reflect the sequela of remote infection or inflammation. No abnormal intra or extra-axial mass lesion or fluid collection. No abnormal mass effect or midline shift. No evidence of acute intracranial hemorrhage or infarct. Ventricular size is normal. Cerebellum unremarkable. Vascular: No asymmetric hyperdense vasculature at the skull base. Skull: Intact Sinuses/Orbits: Paranasal sinuses are clear. Orbits are unremarkable. Other: Mastoid air cells and middle ear cavities are clear. IMPRESSION: No acute intracranial hemorrhage or infarct. Mild senescent change. Electronically Signed   By: Fidela Salisbury M.D.   On: 05/02/2021 20:39   DG Chest Portable 1 View  Result  Date: 05/02/2021 CLINICAL DATA:  Dyspnea, confusion EXAM: PORTABLE CHEST 1 VIEW COMPARISON:  04/09/2020 FINDINGS: Single frontal view of the chest demonstrates a stable cardiac silhouette. There are postsurgical changes from prior left upper lobectomy, with associated left-sided volume loss. Chronic background scarring and emphysema is again noted. No acute airspace disease, effusion, or pneumothorax. There are no acute bony abnormalities. IMPRESSION: 1. Chronic postsurgical changes from left upper lobectomy. 2. Chronic emphysema.  No acute airspace disease. Electronically Signed   By: Randa Ngo M.D.   On: 05/02/2021 20:03    EKG: Independently reviewed.  Sinus bradycardia.  Assessment/Plan Principal Problem:   Acute encephalopathy Active Problems:   Hypothyroidism (acquired)   Depression    Acute encephalopathy cause not clear. -Urine drug screen is pending.  Patient is afebrile.  Ammonia level slightly elevated will give a dose of lactulose.  Check TSH given the bradycardia.  We will need to review patient's home medications.  Patient did respond to Narcan initially.  Presently patient is awake alert but confused. Right upper quadrant/right flank pain.  I am going to order a right upper quadrant sonogram and  based on which we will have further plans. Sinus bradycardia.  Cause not clear patient is not on any rate limiting medications per the discharge summary 3 weeks ago.  Will check TSH.  Check urine drug screen.  Closely monitor. History of depression Home medication needs to be verified.   COPD not actively wheezing. History of postoperative hypothyroidism on Synthroid checking TSH stat. Recent admission for pneumonia. History of lung cancer status post chemo and surgery. History of sleep apnea.  Will need to get further history from family when available.  Unable to reach family know.   DVT prophylaxis: SCDs.  Avoiding anticoagulation in case patient needs lumbar puncture. Code Status: Full code. Family Communication: Unable to reach family. Disposition Plan: To be determined. Consults called: None. Admission status: Observation.   Rise Patience MD [facility] Pager [phone].  If 7PM-7AM, please contact night-coverage [email or web address] Password [facility]  05/03/2021, 12:53 AM

## 2021-05-03 NOTE — Transfer of Care (Signed)
Immediate Anesthesia Transfer of Care Note  Patient: Tracy Park  Procedure(s) Performed: CYSTOSCOPY WITH RETROGRADE PYELOGRAM/URETERAL STENT PLACEMENT (Right)  Patient Location: PACU  Anesthesia Type:General  Level of Consciousness: awake, alert , oriented and patient cooperative  Airway & Oxygen Therapy: Patient Spontanous Breathing and Patient connected to face mask oxygen  Post-op Assessment: Report given to RN and Post -op Vital signs reviewed and stable  Post vital signs: Reviewed and stable  Last Vitals:  Vitals Value Taken Time  BP 135/80 05/03/21 1816  Temp    Pulse 103 05/03/21 1818  Resp 26 05/03/21 1818  SpO2 100 % 05/03/21 1818  Vitals shown include unvalidated device data.  Last Pain:  Vitals:   05/03/21 1727  TempSrc: Oral  PainSc: 9       Patients Stated Pain Goal: 4 (59/45/85 9292)  Complications: No notable events documented.

## 2021-05-03 NOTE — Consult Note (Signed)
I have been asked to see the patient by Dr. Fayrene Helper, for evaluation and management of obstructing right ureteral stone with proximal hydronephrosis and altered mental status.  History of present illness: 65 year old female with significant pulmonary comorbidities including history of lung cancer and COPD, also treated for bipolar disorder presented to the emergency department late last evening with altered mental status and obtundation.  Work-up was fairly unremarkable, her drug screen was positive for marijuana, but no opioids.  Her urine at that time was not terribly concerning for infection and her white blood cell count was normal.  Her kidney function was also within normal limits.  However, CT scan was performed and she was noted to have a proximal right ureteral stone with a smaller stone in the kidney and associated hydronephrosis.  Given no other clear causes to explain her altered mental status urology was consulted for stent placement.  The patient was initially complaining of some right sided flank and pelvic pain.  However, at the time of my encounter she was no longer having any flank pain.  She did wake up enough to tell me that she has a history of a dilated kidney and has had chronic stents for a while.  Her stent was removed in March, and she had a follow-up ultrasound that did demonstrate some mild residual hydroureteronephrosis.  She gets her urologic care through [facility].  Review of systems: A 12 point comprehensive review of systems was obtained and is negative unless otherwise stated in the history of present illness.  Patient Active Problem List   Diagnosis Date Noted   Hydronephrosis 05/03/2021   Acute encephalopathy 05/02/2021   CAP (community acquired pneumonia) 04/10/2020   GERD (gastroesophageal reflux disease) 04/28/2012   Hypothyroidism (acquired) 04/28/2012   Nicotine abuse 04/28/2012   Depression 04/28/2012   Chest pain, atypical 04/28/2012    No current  facility-administered medications on file prior to encounter.   Current Outpatient Medications on File Prior to Encounter  Medication Sig Dispense Refill   albuterol (VENTOLIN HFA) 108 (90 Base) MCG/ACT inhaler Inhale 2 puffs into the lungs every 6 (six) hours as needed for wheezing or shortness of breath.      Budeson-Glycopyrrol-Formoterol (BREZTRI AEROSPHERE) 160-9-4.8 MCG/ACT AERO Inhale 1 puff into the lungs daily.     busPIRone (BUSPAR) 5 MG tablet Take 5 mg by mouth 3 (three) times daily.     desvenlafaxine (PRISTIQ) 50 MG 24 hr tablet Take 50 mg by mouth daily.     famotidine (PEPCID) 40 MG tablet Take 40 mg by mouth daily as needed for heartburn or indigestion.      levothyroxine (SYNTHROID) 137 MCG tablet Take 137 mcg by mouth daily before breakfast.     meloxicam (MOBIC) 15 MG tablet Take 15 mg by mouth daily.     methocarbamol (ROBAXIN) 500 MG tablet Take 500 mg by mouth every 12 (twelve) hours as needed for muscle spasms.      nitroGLYCERIN (NITROSTAT) 0.4 MG SL tablet Place 0.4 mg under the tongue every 5 (five) minutes as needed for chest pain.     omeprazole (PRILOSEC) 20 MG capsule Take 40 mg by mouth daily.     ondansetron (ZOFRAN) 4 MG tablet Take 4 mg by mouth every 8 (eight) hours as needed for nausea or vomiting.      oxybutynin (DITROPAN) 5 MG tablet Take 5 mg by mouth 3 (three) times daily.     prazosin (MINIPRESS) 2 MG capsule Take 2 mg by mouth  at bedtime.     QUEtiapine (SEROQUEL) 300 MG tablet Take 300 mg by mouth at bedtime.     rosuvastatin (CRESTOR) 20 MG tablet Take 20 mg by mouth daily.     senna (SENOKOT) 8.6 MG TABS tablet Take 1 tablet by mouth 2 (two) times daily as needed for mild constipation.     sertraline (ZOLOFT) 100 MG tablet Take 100 mg by mouth in the morning and at bedtime.     tamsulosin (FLOMAX) 0.4 MG CAPS capsule Take 0.4 mg by mouth daily.     umeclidinium bromide (INCRUSE ELLIPTA) 62.5 MCG/INH AEPB Inhale 1 puff into the lungs daily.       Past Medical History:  Diagnosis Date   COPD (chronic obstructive pulmonary disease) ([place])    Hypothyroidism    Thyroid disease     Past Surgical History:  Procedure Laterality Date   kidney stent     thyroidectomy      Social History   Tobacco Use   Smoking status: Every Day    Types: Cigarettes   Smokeless tobacco: Never  Vaping Use   Vaping Use: Never used  Substance Use Topics   Alcohol use: Not Currently   Drug use: Not Currently    Family History  Family history unknown: Yes    PE: Vitals:   05/03/21 1330 05/03/21 1415 05/03/21 1430 05/03/21 1500  BP: 105/72 (!) 146/95 (!) 151/85 138/82  Pulse: 81  (!) 50 73  Resp: 16 17 (!) 22 20  Temp:      TempSrc:      SpO2: 95% 95% 96% 100%   Patient appears to be in no acute distress  patient is alert and oriented x3, but difficulty with recall and confused Atraumatic normocephalic head No cervical or supraclavicular lymphadenopathy appreciated No increased work of breathing, no audible wheezes/rhonchi Regular sinus rhythm/rate Abdomen is soft, nontender, nondistended, mild right CVA tenderness Lower extremities are symmetric without appreciable edema Grossly neurologically intact No identifiable skin lesions  Recent Labs    05/02/21 1929 05/02/21 1955 05/02/21 2045 05/03/21 0048 05/03/21 1029  WBC 12.3*  --   --  9.3  --   HGB 14.2   < > 14.3 12.5 11.9*  HCT 43.9   < > 42.0 39.4 35.0*   < > = values in this interval not displayed.   Recent Labs    05/02/21 1929 05/02/21 1955 05/02/21 2045 05/03/21 0048 05/03/21 1029  NA 142 143 142 143 144  K 3.7 3.7 3.7 4.2 3.7  CL 105 108  --  105  --   CO2 26  --   --  28  --   GLUCOSE 127* 127*  --  130*  --   BUN 16 18  --  24*  --   CREATININE 0.73 0.60  --  0.70  --   CALCIUM 10.0  --   --  9.6  --    Recent Labs    05/02/21 1929  INR 1.0   No results for input(s): LABURIN in the last 72 hours. Results for orders placed or performed during  the hospital encounter of 05/02/21  Resp Panel by RT-PCR (Flu A&B, Covid) Nasopharyngeal Swab     Status: None   Collection Time: 05/02/21  8:00 PM   Specimen: Nasopharyngeal Swab; Nasopharyngeal(NP) swabs in vial transport medium  Result Value Ref Range Status   SARS Coronavirus 2 by RT PCR NEGATIVE NEGATIVE Final    Comment: (NOTE) SARS-CoV-2 target  nucleic acids are NOT DETECTED.  The SARS-CoV-2 RNA is generally detectable in upper respiratory specimens during the acute phase of infection. The lowest concentration of SARS-CoV-2 viral copies this assay can detect is 138 copies/mL. A negative result does not preclude SARS-Cov-2 infection and should not be used as the sole basis for treatment or other patient management decisions. A negative result may occur with  improper specimen collection/handling, submission of specimen other than nasopharyngeal swab, presence of viral mutation(s) within the areas targeted by this assay, and inadequate number of viral copies(<138 copies/mL). A negative result must be combined with clinical observations, patient history, and epidemiological information. The expected result is Negative.  Fact Sheet for Patients:  EntrepreneurPulse.com.au  Fact Sheet for Healthcare Providers:  IncredibleEmployment.be  This test is no t yet approved or cleared by the [place] FDA and  has been authorized for detection and/or diagnosis of SARS-CoV-2 by FDA under an Emergency Use Authorization (EUA). This EUA will remain  in effect (meaning this test can be used) for the duration of the COVID-19 declaration under Section 564(b)(1) of the Act, 21 U.S.C.section 360bbb-3(b)(1), unless the authorization is terminated  or revoked sooner.       Influenza A by PCR NEGATIVE NEGATIVE Final   Influenza B by PCR NEGATIVE NEGATIVE Final    Comment: (NOTE) The Xpert Xpress SARS-CoV-2/FLU/RSV plus assay is intended as an aid in the  diagnosis of influenza from Nasopharyngeal swab specimens and should not be used as a sole basis for treatment. Nasal washings and aspirates are unacceptable for Xpert Xpress SARS-CoV-2/FLU/RSV testing.  Fact Sheet for Patients: EntrepreneurPulse.com.au  Fact Sheet for Healthcare Providers: IncredibleEmployment.be  This test is not yet approved or cleared by the [place] FDA and has been authorized for detection and/or diagnosis of SARS-CoV-2 by FDA under an Emergency Use Authorization (EUA). This EUA will remain in effect (meaning this test can be used) for the duration of the COVID-19 declaration under Section 564(b)(1) of the Act, 21 U.S.C. section 360bbb-3(b)(1), unless the authorization is terminated or revoked.  Performed at [facility], [place] [name], [place], [place] [address]     Imaging: Reviewed the patient's CT scan which demonstrates a right proximal ureteral stone as well as a nonobstructing stone in the right lower pole.  Imp: The patient has altered mental status and an obstructing right ureteral stone.  Recommendations: Plan to urgently place a stent in the patient's right ureter to relieve the obstruction and hopefully alleviate her confusion.  From here, she will be evaluated by hospital medicine and ultimately will need to return to [facility] urology for more definitive management of her obstructing stone.   Thank you for involving me in this patient's care, Please page with any further questions or concerns. Ardis Hughs

## 2021-05-03 NOTE — Anesthesia Procedure Notes (Signed)
Procedure Name: Intubation Date/Time: 05/03/2021 5:54 PM Performed by: Cleda Daub, CRNA Pre-anesthesia Checklist: Patient identified, Emergency Drugs available, Suction available and Patient being monitored Patient Re-evaluated:Patient Re-evaluated prior to induction Oxygen Delivery Method: Circle system utilized Preoxygenation: Pre-oxygenation with 100% oxygen Induction Type: IV induction, Rapid sequence and Cricoid Pressure applied Laryngoscope Size: Miller and 2 Grade View: Grade I Tube type: Oral Tube size: 7.0 mm Number of attempts: 1 Airway Equipment and Method: Stylet and Oral airway Placement Confirmation: ETT inserted through vocal cords under direct vision, positive ETCO2 and breath sounds checked- equal and bilateral Secured at: 21 cm Tube secured with: Tape Dental Injury: Teeth and Oropharynx as per pre-operative assessment

## 2021-05-04 ENCOUNTER — Other Ambulatory Visit: Payer: Self-pay

## 2021-05-04 ENCOUNTER — Encounter (HOSPITAL_COMMUNITY): Payer: Self-pay | Admitting: Urology

## 2021-05-04 DIAGNOSIS — N1330 Unspecified hydronephrosis: Secondary | ICD-10-CM

## 2021-05-04 DIAGNOSIS — F32A Depression, unspecified: Secondary | ICD-10-CM

## 2021-05-04 DIAGNOSIS — G9340 Encephalopathy, unspecified: Secondary | ICD-10-CM | POA: Diagnosis not present

## 2021-05-04 LAB — GLUCOSE, CAPILLARY
Glucose-Capillary: 107 mg/dL — ABNORMAL HIGH (ref 70–99)
Glucose-Capillary: 110 mg/dL — ABNORMAL HIGH (ref 70–99)
Glucose-Capillary: 164 mg/dL — ABNORMAL HIGH (ref 70–99)
Glucose-Capillary: 234 mg/dL — ABNORMAL HIGH (ref 70–99)
Glucose-Capillary: 98 mg/dL (ref 70–99)

## 2021-05-04 LAB — COMPREHENSIVE METABOLIC PANEL
ALT: 12 U/L (ref 0–44)
AST: 16 U/L (ref 15–41)
Albumin: 3.4 g/dL — ABNORMAL LOW (ref 3.5–5.0)
Alkaline Phosphatase: 58 U/L (ref 38–126)
Anion gap: 6 (ref 5–15)
BUN: 29 mg/dL — ABNORMAL HIGH (ref 8–23)
CO2: 28 mmol/L (ref 22–32)
Calcium: 9.2 mg/dL (ref 8.9–10.3)
Chloride: 109 mmol/L (ref 98–111)
Creatinine, Ser: 0.61 mg/dL (ref 0.44–1.00)
GFR, Estimated: >60 mL/min (ref >60)
Glucose, Bld: 93 mg/dL (ref 70–99)
Potassium: 3.7 mmol/L (ref 3.5–5.1)
Sodium: 143 mmol/L (ref 135–145)
Total Bilirubin: 0.4 mg/dL (ref 0.3–1.2)
Total Protein: 6.3 g/dL — ABNORMAL LOW (ref 6.5–8.1)

## 2021-05-04 LAB — URINE CULTURE: Culture: >=100000 — AB

## 2021-05-04 LAB — CBC WITH DIFFERENTIAL/PLATELET
Abs Immature Granulocytes: 0.01 10*3/uL (ref 0.00–0.07)
Basophils Absolute: 0 10*3/uL (ref 0.0–0.1)
Basophils Relative: 0 %
Eosinophils Absolute: 0 10*3/uL (ref 0.0–0.5)
Eosinophils Relative: 0 %
HCT: 33.1 % — ABNORMAL LOW (ref 36.0–46.0)
Hemoglobin: 10.5 g/dL — ABNORMAL LOW (ref 12.0–15.0)
Immature Granulocytes: 0 %
Lymphocytes Relative: 27 %
Lymphs Abs: 1.9 10*3/uL (ref 0.7–4.0)
MCH: 29.7 pg (ref 26.0–34.0)
MCHC: 31.7 g/dL (ref 30.0–36.0)
MCV: 93.5 fL (ref 80.0–100.0)
Monocytes Absolute: 0.7 10*3/uL (ref 0.1–1.0)
Monocytes Relative: 10 %
Neutro Abs: 4.4 10*3/uL (ref 1.7–7.7)
Neutrophils Relative %: 63 %
Platelets: 198 10*3/uL (ref 150–400)
RBC: 3.54 MIL/uL — ABNORMAL LOW (ref 3.87–5.11)
RDW: 14 % (ref 11.5–15.5)
WBC: 6.9 10*3/uL (ref 4.0–10.5)
nRBC: 0 % (ref 0.0–0.2)

## 2021-05-04 LAB — MAGNESIUM: Magnesium: 1.8 mg/dL (ref 1.7–2.4)

## 2021-05-04 LAB — PHOSPHORUS: Phosphorus: 3.1 mg/dL (ref 2.5–4.6)

## 2021-05-04 LAB — AMMONIA: Ammonia: 24 umol/L (ref 9–35)

## 2021-05-04 MED ORDER — ENSURE ENLIVE PO LIQD
237.0000 mL | Freq: Two times a day (BID) | ORAL | Status: DC
  Administered 2021-05-04 – 2021-05-05 (×2): 237 mL via ORAL

## 2021-05-04 MED ORDER — TAB-A-VITE/IRON PO TABS
1.0000 | ORAL_TABLET | Freq: Every day | ORAL | Status: DC
  Administered 2021-05-04 – 2021-05-05 (×2): 1 via ORAL
  Filled 2021-05-04 (×2): qty 1

## 2021-05-04 MED ORDER — TRAMADOL HCL 50 MG PO TABS
50.0000 mg | ORAL_TABLET | Freq: Once | ORAL | Status: AC
  Administered 2021-05-04: 50 mg via ORAL
  Filled 2021-05-04: qty 1

## 2021-05-04 NOTE — Progress Notes (Signed)
Patient ID: Tracy Park, female   DOB: [birth year], 65 y.o.   MRN: [id number]  PROGRESS NOTE    Tracy Park  TKW:[id number] DOB: Jun 23, 1956 DOA: 05/02/2021 PCP: [name], PA   Brief Narrative:  65 year old female with history of COPD, hypothyroidism, lung cancer status post chemotherapy 2 years ago, depression, pneumonia 3 weeks ago presented with confusion.  On presentation, patient was confused for which she was given Narcan following which she became slightly more awake.  CT of the head was unremarkable.  Ammonia was 61.  WBC of 12.3 similar lactic acid was normal.  COVID test was negative.  Chest x-ray was unremarkable.  UDS was positive for THC.  She was found to have right-sided hydronephrosis with 6 mm obstructing calculus.  Urology was consulted: Patient underwent cystoscopy and right ureteral stent placement by urology.  Assessment & Plan:   Acute metabolic and toxic encephalopathy -Possibly from combination of hydronephrosis and marijuana use. -CT of the head was negative for any acute intracranial abnormality.  Patient was given Narcan on presentation with slight improvement in mental function. -Mental status has much improved this morning and she is probably back to her baseline.  Right-sided hydronephrosis with 6 mm obstructing calculus -Status post cystoscopy and right ureteral stent placement by neurology.  Currently afebrile with no leukocytosis.  Urology thinks that patient does not need any antibiotics.  Will DC antibiotics.  Leukocytosis -Resolved  History of lung cancer status postchemotherapy and surgery -Outpatient follow-up with oncology  Depression -Outpatient follow-up with psychiatry.  Continue buspirone, prazosin, quetiapine, sertraline and venlafaxine.  Patient is to follow-up with psychiatry at earliest convenience to determine if she needs all of these medications.  COPD -Stable  Hypothyroidism -Continue Synthroid  Generalized  deconditioning -PT eval   DVT prophylaxis: SCDs Code Status: Full Family Communication: None at bedside Disposition Plan: Status is: Inpatient  Remains inpatient appropriate because:Inpatient level of care appropriate due to severity of illness  Dispo: The patient is from: Home              Anticipated d/c is to: Home in 1 to 2 days if clinically remains stable              Patient currently is not medically stable to d/c.   Difficult to place patient No   Consultants: Neurology  Procedures: Cystoscopy and right ureteral stent placement on 05/03/2021 by urology  Antimicrobials:  Anti-infectives (From admission, onward)    Start     Dose/Rate Route Frequency Ordered Stop   05/03/21 0900  cefTRIAXone (ROCEPHIN) 1 g in sodium chloride 0.9 % 100 mL IVPB  Status:  Discontinued        1 g 200 mL/hr over 30 Minutes Intravenous Every 24 hours 05/03/21 0852 05/04/21 1120        Subjective: Patient seen and examined at bedside.  Denies worsening abdominal pain, nausea or vomiting.  Does not remember what happened and now she presented to the hospital.  No overnight fever, seizures reported.  Objective: Vitals:   05/03/21 2345 05/04/21 0352 05/04/21 0739 05/04/21 0809  BP: 101/62 100/74 113/89   Pulse: 78 67 68   Resp: 18 18 18    Temp: 98.3 F (36.8 C) 97.8 F (36.6 C) 98.9 F (37.2 C)   TempSrc: Oral Oral    SpO2: 100% 100% 100% 98%  Weight:      Height:        Intake/Output Summary (Last 24 hours) at 05/04/2021 1201 Last  data filed at 05/04/2021 0900 Gross per 24 hour  Intake 1050 ml  Output 1 ml  Net 1049 ml   Filed Weights   05/03/21 1727  Weight: 43.5 kg    Examination:  General exam: Appears calm and comfortable.  Currently on 2 L oxygen via nasal cannula.  Looks chronically ill and deconditioned.  Very thinly built. Respiratory system: Bilateral decreased breath sounds at bases Cardiovascular system: S1 & S2 heard, Rate controlled Gastrointestinal  system: Abdomen is nondistended, soft and nontender. Normal bowel sounds heard. Extremities: No cyanosis, clubbing, edema  Central nervous system: Alert and oriented.  Slow to respond but answers questions appropriately.  No focal neurological deficits. Moving extremities Skin: No rashes, lesions or ulcers Psychiatry: Affect is mostly flat with   Data Reviewed: I have personally reviewed following labs and imaging studies  CBC: Recent Labs  Lab 05/02/21 1929 05/02/21 1955 05/02/21 2045 05/03/21 0048 05/03/21 1029 05/04/21 0515  WBC 12.3*  --   --  9.3  --  6.9  NEUTROABS 9.7*  --   --  7.1  --  4.4  HGB 14.2 15.0 14.3 12.5 11.9* 10.5*  HCT 43.9 44.0 42.0 39.4 35.0* 33.1*  MCV 93.2  --   --  92.1  --  93.5  PLT 257  --   --  248  --  774   Basic Metabolic Panel: Recent Labs  Lab 05/02/21 1929 05/02/21 1955 05/02/21 2045 05/03/21 0048 05/03/21 1029 05/04/21 0515  NA 142 143 142 143 144 143  K 3.7 3.7 3.7 4.2 3.7 3.7  CL 105 108  --  105  --  109  CO2 26  --   --  28  --  28  GLUCOSE 127* 127*  --  130*  --  93  BUN 16 18  --  24*  --  29*  CREATININE 0.73 0.60  --  0.70  --  0.61  CALCIUM 10.0  --   --  9.6  --  9.2  MG  --   --   --   --   --  1.8  PHOS  --   --   --   --   --  3.1   GFR: Estimated Creatinine Clearance: 49.4 mL/min (by C-G formula based on SCr of 0.61 mg/dL). Liver Function Tests: Recent Labs  Lab 05/02/21 1929 05/03/21 0048 05/04/21 0515  AST 20 17 16   ALT 13 13 12   ALKPHOS 78 74 58  BILITOT 0.8 0.8 0.4  PROT 7.8 7.2 6.3*  ALBUMIN 4.1 3.6 3.4*   Recent Labs  Lab 05/02/21 1929  LIPASE 23   Recent Labs  Lab 05/02/21 1929 05/03/21 1949 05/04/21 0515  AMMONIA 61* <9* 24   Coagulation Profile: Recent Labs  Lab 05/02/21 1929  INR 1.0   Cardiac Enzymes: Recent Labs  Lab 05/02/21 1929  CKTOTAL 101   BNP (last 3 results) No results for input(s): PROBNP in the last 8760 hours. HbA1C: No results for input(s): HGBA1C in the  last 72 hours. CBG: Recent Labs  Lab 05/03/21 1136 05/03/21 1832 05/04/21 0042 05/04/21 0623 05/04/21 1134  GLUCAP 111* 95 234* 98 107*   Lipid Profile: No results for input(s): CHOL, HDL, LDLCALC, TRIG, CHOLHDL, LDLDIRECT in the last 72 hours. Thyroid Function Tests: Recent Labs    05/03/21 0048  TSH 0.562   Anemia Panel: No results for input(s): VITAMINB12, FOLATE, FERRITIN, TIBC, IRON, RETICCTPCT in the last 72 hours. Sepsis Labs:  Recent Labs  Lab 05/02/21 1929  LATICACIDVEN 1.3    Recent Results (from the past 240 hour(s))  Resp Panel by RT-PCR (Flu A&B, Covid) Nasopharyngeal Swab     Status: None   Collection Time: 05/02/21  8:00 PM   Specimen: Nasopharyngeal Swab; Nasopharyngeal(NP) swabs in vial transport medium  Result Value Ref Range Status   SARS Coronavirus 2 by RT PCR NEGATIVE NEGATIVE Final    Comment: (NOTE) SARS-CoV-2 target nucleic acids are NOT DETECTED.  The SARS-CoV-2 RNA is generally detectable in upper respiratory specimens during the acute phase of infection. The lowest concentration of SARS-CoV-2 viral copies this assay can detect is 138 copies/mL. A negative result does not preclude SARS-Cov-2 infection and should not be used as the sole basis for treatment or other patient management decisions. A negative result may occur with  improper specimen collection/handling, submission of specimen other than nasopharyngeal swab, presence of viral mutation(s) within the areas targeted by this assay, and inadequate number of viral copies(<138 copies/mL). A negative result must be combined with clinical observations, patient history, and epidemiological information. The expected result is Negative.  Fact Sheet for Patients:  EntrepreneurPulse.com.au  Fact Sheet for Healthcare Providers:  IncredibleEmployment.be  This test is no t yet approved or cleared by the [place] FDA and  has been authorized for  detection and/or diagnosis of SARS-CoV-2 by FDA under an Emergency Use Authorization (EUA). This EUA will remain  in effect (meaning this test can be used) for the duration of the COVID-19 declaration under Section 564(b)(1) of the Act, 21 U.S.C.section 360bbb-3(b)(1), unless the authorization is terminated  or revoked sooner.       Influenza A by PCR NEGATIVE NEGATIVE Final   Influenza B by PCR NEGATIVE NEGATIVE Final    Comment: (NOTE) The Xpert Xpress SARS-CoV-2/FLU/RSV plus assay is intended as an aid in the diagnosis of influenza from Nasopharyngeal swab specimens and should not be used as a sole basis for treatment. Nasal washings and aspirates are unacceptable for Xpert Xpress SARS-CoV-2/FLU/RSV testing.  Fact Sheet for Patients: EntrepreneurPulse.com.au  Fact Sheet for Healthcare Providers: IncredibleEmployment.be  This test is not yet approved or cleared by the [place] FDA and has been authorized for detection and/or diagnosis of SARS-CoV-2 by FDA under an Emergency Use Authorization (EUA). This EUA will remain in effect (meaning this test can be used) for the duration of the COVID-19 declaration under Section 564(b)(1) of the Act, 21 U.S.C. section 360bbb-3(b)(1), unless the authorization is terminated or revoked.  Performed at [facility], [place] [address], [place], [place] [address]   Urine Culture     Status: None (Preliminary result)   Collection Time: 05/03/21 10:20 AM   Specimen: Urine, Clean Catch  Result Value Ref Range Status   Specimen Description URINE, CLEAN CATCH  Final   Special Requests NONE  Final   Culture   Final    CULTURE REINCUBATED FOR BETTER GROWTH Performed at [facility], [place] [address] [place], [place] [address]    Report Status PENDING  Incomplete         Radiology Studies: CT Head Wo Contrast  Result Date: 05/02/2021 CLINICAL DATA:  Altered mental status EXAM: CT HEAD  WITHOUT CONTRAST TECHNIQUE: Contiguous axial images were obtained from the base of the skull through the vertex without intravenous contrast. COMPARISON:  None. FINDINGS: Brain: Normal anatomic configuration. Parenchymal volume loss is commensurate with the patient's age. Mild periventricular white matter changes are present likely reflecting  the sequela of small vessel ischemia. There are punctate left frontal periventricular calcifications which may reflect the sequela of remote infection or inflammation. No abnormal intra or extra-axial mass lesion or fluid collection. No abnormal mass effect or midline shift. No evidence of acute intracranial hemorrhage or infarct. Ventricular size is normal. Cerebellum unremarkable. Vascular: No asymmetric hyperdense vasculature at the skull base. Skull: Intact Sinuses/Orbits: Paranasal sinuses are clear. Orbits are unremarkable. Other: Mastoid air cells and middle ear cavities are clear. IMPRESSION: No acute intracranial hemorrhage or infarct. Mild senescent change. Electronically Signed   By: Fidela Salisbury M.D.   On: 05/02/2021 20:39   DG Chest Portable 1 View  Result Date: 05/02/2021 CLINICAL DATA:  Dyspnea, confusion EXAM: PORTABLE CHEST 1 VIEW COMPARISON:  04/09/2020 FINDINGS: Single frontal view of the chest demonstrates a stable cardiac silhouette. There are postsurgical changes from prior left upper lobectomy, with associated left-sided volume loss. Chronic background scarring and emphysema is again noted. No acute airspace disease, effusion, or pneumothorax. There are no acute bony abnormalities. IMPRESSION: 1. Chronic postsurgical changes from left upper lobectomy. 2. Chronic emphysema.  No acute airspace disease. Electronically Signed   By: Randa Ngo M.D.   On: 05/02/2021 20:03   DG C-Arm 1-60 Min-No Report  Result Date: 05/03/2021 Fluoroscopy was utilized by the requesting physician.  No radiographic interpretation.   CT RENAL STONE STUDY  Result  Date: 05/03/2021 CLINICAL DATA:  65 year old female with history of flank pain. Suspected kidney stone. EXAM: CT ABDOMEN AND PELVIS WITHOUT CONTRAST TECHNIQUE: Multidetector CT imaging of the abdomen and pelvis was performed following the standard protocol without IV contrast. COMPARISON:  CT the abdomen and pelvis 11/06/2009. FINDINGS: Lower chest: Emphysematous changes are noted throughout the visualized lung bases. Multiple tiny 2-3 mm micronodules are noted in the periphery of the lungs bilaterally, nonspecific, but statistically likely to represent areas of mucoid impaction within terminal bronchioles. Atherosclerotic calcifications in the thoracic aorta. Hepatobiliary: No definite suspicious cystic or solid hepatic lesions are confidently identified on today's noncontrast CT examination. Unenhanced appearance of the gallbladder is normal. Pancreas: No definite pancreatic mass or peripancreatic fluid collections or inflammatory changes are noted on today's noncontrast CT examination. Spleen: Unremarkable. Adrenals/Urinary Tract: 5 mm nonobstructive calculus in the lower pole collecting system of the right kidney. 2 mm nonobstructive calculus in the lower pole collecting system of left kidney. In addition, in the proximal third of the right ureter (axial image 37 of series 3) there is a 6 mm calculus which is associated with mild-to-moderate proximal right hydroureteronephrosis. No additional ureteral calculi or bladder calculi are confidently identified. No left hydroureteronephrosis. Unenhanced appearance of the kidneys, bilateral adrenal glands and urinary bladder is otherwise unremarkable. Stomach/Bowel: Unenhanced appearance of the stomach is normal. No pathologic dilatation of small bowel or colon. The appendix is not confidently identified and may be surgically absent. Regardless, there are no inflammatory changes noted adjacent to the cecum to suggest the presence of an acute appendicitis at this time.  Vascular/Lymphatic: Aortic atherosclerosis. No definite lymphadenopathy confidently identified in the abdomen or pelvis on today's noncontrast CT examination (assessment is limited by lack of intra-abdominal and retroperitoneal fat). Reproductive: Status post hysterectomy. Ovaries are not confidently identified may be surgically absent or atrophic. Other: No significant volume of ascites.  No pneumoperitoneum. Musculoskeletal: There are no aggressive appearing lytic or blastic lesions noted in the visualized portions of the skeleton. IMPRESSION: 1. 6 mm calculus in the proximal third of the right ureter with mild to  moderate proximal right hydroureteronephrosis indicating obstruction at this time. 2. Additional nonobstructive calculi in the collecting systems of both kidneys measuring up to 5 mm in the lower pole collecting system of the right kidney. 3. Emphysema. 4. Tiny 2-3 mm micronodules in the periphery of the lung bases bilaterally, nonspecific, but strongly favored to represent benign areas of mucoid impaction within terminal bronchioles. No follow-up needed if patient is low-risk (and has no known or suspected primary neoplasm). Non-contrast chest CT can be considered in 12 months if patient is high-risk. This recommendation follows the consensus statement: Guidelines for Management of Incidental Pulmonary Nodules Detected on CT Images: From the Fleischner Society 2017; Radiology 2017; 284:228-243. 5. Aortic atherosclerosis. Electronically Signed   By: Vinnie Langton M.D.   On: 05/03/2021 06:57   US Abdomen Limited RUQ (LIVER/GB)  Result Date: 05/03/2021 CLINICAL DATA:  Right upper quadrant pain EXAM: ULTRASOUND ABDOMEN LIMITED RIGHT UPPER QUADRANT COMPARISON:  None. FINDINGS: Gallbladder: No gallstones or wall thickening visualized. No sonographic Murphy sign noted by sonographer. Common bile duct: Diameter: 2.9 mm Liver: No focal lesion identified. Within normal limits in parenchymal echogenicity.  Portal vein is patent on color Doppler imaging with normal direction of blood flow towards the liver. Other: Incidental note is made of right-sided hydronephrosis. IMPRESSION: Right-sided hydronephrosis. Otherwise unremarkable right upper quadrant ultrasound. Electronically Signed   By: Inez Catalina M.D.   On: 05/03/2021 02:24        Scheduled Meds:  busPIRone  5 mg Oral TID   levothyroxine  137 mcg Oral QAC breakfast   oxybutynin  5 mg Oral TID   pantoprazole  40 mg Oral Daily   prazosin  2 mg Oral QHS   QUEtiapine  300 mg Oral QHS   rosuvastatin  20 mg Oral Daily   sertraline  100 mg Oral Daily   tamsulosin  0.4 mg Oral Daily   umeclidinium bromide  1 puff Inhalation Daily   venlafaxine XR  75 mg Oral Q breakfast   Continuous Infusions:        Tracy August, MD [facility] 05/04/2021, 12:01 PM

## 2021-05-04 NOTE — Progress Notes (Signed)
Initial Nutrition Assessment  DOCUMENTATION CODES:   Non-severe (moderate) malnutrition in context of social or environmental circumstances, Underweight  INTERVENTION:   -Ensure Plus PO BID, each provides 350 kcals and 13g protein  -Multivitamin with minerals daily  NUTRITION DIAGNOSIS:   Moderate Malnutrition related to social / environmental circumstances as evidenced by moderate muscle depletion, severe fat depletion, significant weight loss  GOAL:   Patient will meet greater than or equal to 90% of their needs  MONITOR:   PO intake, Supplement acceptance, Weight trends, Labs, I & O's  REASON FOR ASSESSMENT:   Malnutrition Screening Tool    ASSESSMENT:   65 year old female with history of COPD, hypothyroidism, lung cancer status post chemotherapy 2 years ago, depression, pneumonia 3 weeks ago presented with confusion.  8/25: s/p cystoscopy, ureteral stent placement   Patient in room with daughter at bedside. Pt reports she has had decreased appetite and not eating as much as she should d/t stress and her husband passing within the past year. Pt visibly upset speaking about this but is able to keep a positive attitude. Per pt's daughter, pt has always been small, has a history of anorexia nervosa, did receive treatment for lung cancer in the past in which she lost 100 lbs.   Ate 100% of her breakfast this morning of an omelet with english muffin. Having a salad for lunch.  Per weight records in care everywhere, pt weighed 105 lbs on 5/13. Has lost 9 lbs since then, that is 8% wt loss x 3.5 months which is significant for time frame.  Medications reviewed.  Labs reviewed:  CBGs: 95-234  NUTRITION - FOCUSED PHYSICAL EXAM:  Flowsheet Row Most Recent Value  Orbital Region Mild depletion  Upper Arm Region Severe depletion  Thoracic and Lumbar Region Unable to assess  Buccal Region Moderate depletion  Temple Region Mild depletion  Clavicle Bone Region Moderate  depletion  Clavicle and Acromion Bone Region Moderate depletion  Scapular Bone Region Moderate depletion  Dorsal Hand Moderate depletion  Patellar Region Moderate depletion  Anterior Thigh Region Unable to assess  Posterior Calf Region Moderate depletion  Edema (RD Assessment) None  Hair Reviewed  Eyes Reviewed  Mouth Reviewed       Diet Order:   Diet Order             Diet Heart Room service appropriate? Yes; Fluid consistency: Thin  Diet effective now                   EDUCATION NEEDS:   Education needs have been addressed  Skin:  Skin Assessment: Reviewed RN Assessment  Last BM:  8/21  Height:   Ht Readings from Last 1 Encounters:  05/03/21 5\' 3"  (1.6 m)    Weight:   Wt Readings from Last 1 Encounters:  05/03/21 43.5 kg    BMI:  Body mass index is 17.01 kg/m.  Estimated Nutritional Needs:   Kcal:  1600-1800  Protein:  75-95g  Fluid:  1.8L/day   Clayton Bibles, MS, RD, LDN Inpatient Clinical Dietitian Contact information available via Amion

## 2021-05-05 DIAGNOSIS — G9340 Encephalopathy, unspecified: Secondary | ICD-10-CM | POA: Diagnosis not present

## 2021-05-05 DIAGNOSIS — N1330 Unspecified hydronephrosis: Secondary | ICD-10-CM | POA: Diagnosis not present

## 2021-05-05 LAB — BASIC METABOLIC PANEL
Anion gap: 6 (ref 5–15)
BUN: 22 mg/dL (ref 8–23)
CO2: 30 mmol/L (ref 22–32)
Calcium: 9.2 mg/dL (ref 8.9–10.3)
Chloride: 108 mmol/L (ref 98–111)
Creatinine, Ser: 0.57 mg/dL (ref 0.44–1.00)
GFR, Estimated: >60 mL/min (ref >60)
Glucose, Bld: 88 mg/dL (ref 70–99)
Potassium: 3.6 mmol/L (ref 3.5–5.1)
Sodium: 144 mmol/L (ref 135–145)

## 2021-05-05 LAB — CBC WITH DIFFERENTIAL/PLATELET
Abs Immature Granulocytes: 0.02 10*3/uL (ref 0.00–0.07)
Basophils Absolute: 0 10*3/uL (ref 0.0–0.1)
Basophils Relative: 0 %
Eosinophils Absolute: 0.1 10*3/uL (ref 0.0–0.5)
Eosinophils Relative: 1 %
HCT: 34.8 % — ABNORMAL LOW (ref 36.0–46.0)
Hemoglobin: 11.1 g/dL — ABNORMAL LOW (ref 12.0–15.0)
Immature Granulocytes: 0 %
Lymphocytes Relative: 46 %
Lymphs Abs: 3.2 10*3/uL (ref 0.7–4.0)
MCH: 29.4 pg (ref 26.0–34.0)
MCHC: 31.9 g/dL (ref 30.0–36.0)
MCV: 92.1 fL (ref 80.0–100.0)
Monocytes Absolute: 0.6 10*3/uL (ref 0.1–1.0)
Monocytes Relative: 8 %
Neutro Abs: 3.1 10*3/uL (ref 1.7–7.7)
Neutrophils Relative %: 45 %
Platelets: 215 10*3/uL (ref 150–400)
RBC: 3.78 MIL/uL — ABNORMAL LOW (ref 3.87–5.11)
RDW: 14 % (ref 11.5–15.5)
WBC: 7 10*3/uL (ref 4.0–10.5)
nRBC: 0 % (ref 0.0–0.2)

## 2021-05-05 LAB — GLUCOSE, CAPILLARY
Glucose-Capillary: 92 mg/dL (ref 70–99)
Glucose-Capillary: 103 mg/dL — ABNORMAL HIGH (ref 70–99)

## 2021-05-05 LAB — MAGNESIUM: Magnesium: 1.7 mg/dL (ref 1.7–2.4)

## 2021-05-05 NOTE — Progress Notes (Signed)
Patient will be discharging with family later this morning. Belongings were returned. Education on medication will be provided.

## 2021-05-05 NOTE — Discharge Summary (Signed)
Physician Discharge Summary  Tracy Park MWU:[id number] DOB: July 29, 1956 DOA: 05/02/2021  PCP: [name], PA  Admit date: 05/02/2021 Discharge date: 05/05/2021  Admitted From: Home Disposition: Home  Recommendations for Outpatient Follow-up:  Follow up with PCP in 1 week with repeat CBC/BMP Outpatient follow-up with urology Comply with medications and follow-up Follow up in ED if symptoms worsen or new appear   Home Health: No Equipment/Devices: None  Discharge Condition: Stable CODE STATUS: Full Diet recommendation: Heart healthy  Brief/Interim Summary: 65 year old female with history of COPD, hypothyroidism, lung cancer status post chemotherapy 2 years ago, depression, pneumonia 3 weeks ago presented with confusion.  On presentation, patient was confused for which she was given Narcan following which she became slightly more awake.  CT of the head was unremarkable.  Ammonia was 61.  WBC of 12.3 similar lactic acid was normal.  COVID test was negative.  Chest x-ray was unremarkable.  UDS was positive for THC.  She was found to have right-sided hydronephrosis with 6 mm obstructing calculus.  Urology was consulted: Patient underwent cystoscopy and right ureteral stent placement by urology.  Subsequently, patient improved significantly with almost normalization of mental status.  She is currently afebrile with no leukocytosis; antibiotics have already been discontinued.  She will be discharged home today with outpatient follow-up with urology.  Discharge Diagnoses:   Acute metabolic and toxic encephalopathy -Possibly from combination of hydronephrosis and marijuana use. -CT of the head was negative for any acute intracranial abnormality.  Patient was given Narcan on presentation with slight improvement in mental function. -Mental status has much improved and she is probably back to her baseline. -Discharge patient home today.   Right-sided hydronephrosis with 6 mm  obstructing calculus -Status post cystoscopy and right ureteral stent placement by neurology.  Currently afebrile with no leukocytosis.  Urology thinks that patient does not need any antibiotics.  Antibiotics discontinued on 05/04/2021 -Currently afebrile and hemodynamically stable.  Outpatient follow-up with urology.    Leukocytosis -Resolved  History of lung cancer status postchemotherapy and surgery -Outpatient follow-up with oncology   Depression -Outpatient follow-up with psychiatry.  Continue buspirone, prazosin, quetiapine, sertraline and venlafaxine.  Patient needs to to follow-up with psychiatry at earliest convenience to determine if she needs all of these medications.   COPD -Stable   Hypothyroidism -Continue Synthroid  Chronic respiratory failure with hypoxia -Uses supplemental oxygen at home at 2 L/min  Moderate malnutrition--follow nutrition recommendations     Discharge Instructions  Discharge Instructions     Diet - low sodium heart healthy   Complete by: As directed    Increase activity slowly   Complete by: As directed       Allergies as of 05/05/2021       Reactions   Red Dye Hives, Itching   Oxycontin [oxycodone Hcl]    Other reaction(s): Hallucinations   Aspirin Hives   Tape Rash        Medication List     TAKE these medications    albuterol 108 (90 Base) MCG/ACT inhaler Commonly known as: VENTOLIN HFA Inhale 2 puffs into the lungs every 6 (six) hours as needed for wheezing or shortness of breath.   Breztri Aerosphere 160-9-4.8 MCG/ACT Aero Generic drug: Budeson-Glycopyrrol-Formoterol Inhale 1 puff into the lungs daily.   busPIRone 5 MG tablet Commonly known as: BUSPAR Take 5 mg by mouth 3 (three) times daily.   desvenlafaxine 50 MG 24 hr tablet Commonly known as: PRISTIQ Take 50 mg by mouth daily.  famotidine 40 MG tablet Commonly known as: PEPCID Take 40 mg by mouth daily as needed for heartburn or indigestion.   Incruse  Ellipta 62.5 MCG/INH Aepb Generic drug: umeclidinium bromide Inhale 1 puff into the lungs daily.   levothyroxine 137 MCG tablet Commonly known as: SYNTHROID Take 137 mcg by mouth daily before breakfast.   meloxicam 15 MG tablet Commonly known as: MOBIC Take 15 mg by mouth daily.   methocarbamol 500 MG tablet Commonly known as: ROBAXIN Take 500 mg by mouth every 12 (twelve) hours as needed for muscle spasms.   nitroGLYCERIN 0.4 MG SL tablet Commonly known as: NITROSTAT Place 0.4 mg under the tongue every 5 (five) minutes as needed for chest pain.   omeprazole 20 MG capsule Commonly known as: PRILOSEC Take 40 mg by mouth daily.   ondansetron 4 MG tablet Commonly known as: ZOFRAN Take 4 mg by mouth every 8 (eight) hours as needed for nausea or vomiting.   oxybutynin 5 MG tablet Commonly known as: DITROPAN Take 5 mg by mouth 3 (three) times daily.   prazosin 2 MG capsule Commonly known as: MINIPRESS Take 2 mg by mouth at bedtime.   QUEtiapine 300 MG tablet Commonly known as: SEROQUEL Take 300 mg by mouth at bedtime.   rosuvastatin 20 MG tablet Commonly known as: CRESTOR Take 20 mg by mouth daily.   senna 8.6 MG Tabs tablet Commonly known as: SENOKOT Take 1 tablet by mouth 2 (two) times daily as needed for mild constipation.   sertraline 100 MG tablet Commonly known as: ZOLOFT Take 100 mg by mouth in the morning and at bedtime.   tamsulosin 0.4 MG Caps capsule Commonly known as: FLOMAX Take 0.4 mg by mouth daily.        Follow-up Information     [name], [place]. Schedule an appointment as soon as possible for a visit in 1 week(s).   Specialty: Physician Assistant Why: with repeat cbc/bmp Contact information: [place] [address] [phone]         Ardis Hughs, MD. Schedule an appointment as soon as possible for a visit in 1 week(s).   Specialty: Urology Contact information: [place] [address] [phone]                Allergies  Allergen Reactions   Red Dye Hives and Itching   Oxycontin [Oxycodone Hcl]     Other reaction(s): Hallucinations   Aspirin Hives   Tape Rash    Consultations: Urology   Procedures/Studies: CT Head Wo Contrast  Result Date: 05/02/2021 CLINICAL DATA:  Altered mental status EXAM: CT HEAD WITHOUT CONTRAST TECHNIQUE: Contiguous axial images were obtained from the base of the skull through the vertex without intravenous contrast. COMPARISON:  None. FINDINGS: Brain: Normal anatomic configuration. Parenchymal volume loss is commensurate with the patient's age. Mild periventricular white matter changes are present likely reflecting the sequela of small vessel ischemia. There are punctate left frontal periventricular calcifications which may reflect the sequela of remote infection or inflammation. No abnormal intra or extra-axial mass lesion or fluid collection. No abnormal mass effect or midline shift. No evidence of acute intracranial hemorrhage or infarct. Ventricular size is normal. Cerebellum unremarkable. Vascular: No asymmetric hyperdense vasculature at the skull base. Skull: Intact Sinuses/Orbits: Paranasal sinuses are clear. Orbits are unremarkable. Other: Mastoid air cells and middle ear cavities are clear. IMPRESSION: No acute intracranial hemorrhage or infarct. Mild senescent change. Electronically Signed  By: Fidela Salisbury M.D.   On: 05/02/2021 20:39   DG Chest Portable 1 View  Result Date: 05/02/2021 CLINICAL DATA:  Dyspnea, confusion EXAM: PORTABLE CHEST 1 VIEW COMPARISON:  04/09/2020 FINDINGS: Single frontal view of the chest demonstrates a stable cardiac silhouette. There are postsurgical changes from prior left upper lobectomy, with associated left-sided volume loss. Chronic background scarring and emphysema is again noted. No acute airspace disease, effusion, or pneumothorax. There are no acute bony  abnormalities. IMPRESSION: 1. Chronic postsurgical changes from left upper lobectomy. 2. Chronic emphysema.  No acute airspace disease. Electronically Signed   By: Randa Ngo M.D.   On: 05/02/2021 20:03   DG C-Arm 1-60 Min-No Report  Result Date: 05/03/2021 Fluoroscopy was utilized by the requesting physician.  No radiographic interpretation.   CT RENAL STONE STUDY  Result Date: 05/03/2021 CLINICAL DATA:  65 year old female with history of flank pain. Suspected kidney stone. EXAM: CT ABDOMEN AND PELVIS WITHOUT CONTRAST TECHNIQUE: Multidetector CT imaging of the abdomen and pelvis was performed following the standard protocol without IV contrast. COMPARISON:  CT the abdomen and pelvis 11/06/2009. FINDINGS: Lower chest: Emphysematous changes are noted throughout the visualized lung bases. Multiple tiny 2-3 mm micronodules are noted in the periphery of the lungs bilaterally, nonspecific, but statistically likely to represent areas of mucoid impaction within terminal bronchioles. Atherosclerotic calcifications in the thoracic aorta. Hepatobiliary: No definite suspicious cystic or solid hepatic lesions are confidently identified on today's noncontrast CT examination. Unenhanced appearance of the gallbladder is normal. Pancreas: No definite pancreatic mass or peripancreatic fluid collections or inflammatory changes are noted on today's noncontrast CT examination. Spleen: Unremarkable. Adrenals/Urinary Tract: 5 mm nonobstructive calculus in the lower pole collecting system of the right kidney. 2 mm nonobstructive calculus in the lower pole collecting system of left kidney. In addition, in the proximal third of the right ureter (axial image 37 of series 3) there is a 6 mm calculus which is associated with mild-to-moderate proximal right hydroureteronephrosis. No additional ureteral calculi or bladder calculi are confidently identified. No left hydroureteronephrosis. Unenhanced appearance of the kidneys,  bilateral adrenal glands and urinary bladder is otherwise unremarkable. Stomach/Bowel: Unenhanced appearance of the stomach is normal. No pathologic dilatation of small bowel or colon. The appendix is not confidently identified and may be surgically absent. Regardless, there are no inflammatory changes noted adjacent to the cecum to suggest the presence of an acute appendicitis at this time. Vascular/Lymphatic: Aortic atherosclerosis. No definite lymphadenopathy confidently identified in the abdomen or pelvis on today's noncontrast CT examination (assessment is limited by lack of intra-abdominal and retroperitoneal fat). Reproductive: Status post hysterectomy. Ovaries are not confidently identified may be surgically absent or atrophic. Other: No significant volume of ascites.  No pneumoperitoneum. Musculoskeletal: There are no aggressive appearing lytic or blastic lesions noted in the visualized portions of the skeleton. IMPRESSION: 1. 6 mm calculus in the proximal third of the right ureter with mild to moderate proximal right hydroureteronephrosis indicating obstruction at this time. 2. Additional nonobstructive calculi in the collecting systems of both kidneys measuring up to 5 mm in the lower pole collecting system of the right kidney. 3. Emphysema. 4. Tiny 2-3 mm micronodules in the periphery of the lung bases bilaterally, nonspecific, but strongly favored to represent benign areas of mucoid impaction within terminal bronchioles. No follow-up needed if patient is low-risk (and has no known or suspected primary neoplasm). Non-contrast chest CT can be considered in 12 months if patient is high-risk. This recommendation follows the consensus  statement: Guidelines for Management of Incidental Pulmonary Nodules Detected on CT Images: From the Fleischner Society 2017; Radiology 2017; 284:228-243. 5. Aortic atherosclerosis. Electronically Signed   By: Vinnie Langton M.D.   On: 05/03/2021 06:57   US Abdomen Limited  RUQ (LIVER/GB)  Result Date: 05/03/2021 CLINICAL DATA:  Right upper quadrant pain EXAM: ULTRASOUND ABDOMEN LIMITED RIGHT UPPER QUADRANT COMPARISON:  None. FINDINGS: Gallbladder: No gallstones or wall thickening visualized. No sonographic Murphy sign noted by sonographer. Common bile duct: Diameter: 2.9 mm Liver: No focal lesion identified. Within normal limits in parenchymal echogenicity. Portal vein is patent on color Doppler imaging with normal direction of blood flow towards the liver. Other: Incidental note is made of right-sided hydronephrosis. IMPRESSION: Right-sided hydronephrosis. Otherwise unremarkable right upper quadrant ultrasound. Electronically Signed   By: Inez Catalina M.D.   On: 05/03/2021 02:24      Subjective: Patient seen and examined at bedside.  Denies overnight fever, vomiting, shortness of breath.  Feels okay to go home today.  Discharge Exam: Vitals:   05/05/21 0806 05/05/21 0808  BP:    Pulse:    Resp:    Temp:    SpO2: 98% 98%    General: Pt is alert, awake, not in acute distress.  Looks chronically ill and deconditioned.  Very thinly built.  On 2 L oxygen via nasal cannula. Cardiovascular: rate controlled, S1/S2 + Respiratory: bilateral decreased breath sounds at bases with some scattered crackles Abdominal: Soft, NT, ND, bowel sounds + Extremities: no edema, no cyanosis    The results of significant diagnostics from this hospitalization (including imaging, microbiology, ancillary and laboratory) are listed below for reference.     Microbiology: Recent Results (from the past 240 hour(s))  Resp Panel by RT-PCR (Flu A&B, Covid) Nasopharyngeal Swab     Status: None   Collection Time: 05/02/21  8:00 PM   Specimen: Nasopharyngeal Swab; Nasopharyngeal(NP) swabs in vial transport medium  Result Value Ref Range Status   SARS Coronavirus 2 by RT PCR NEGATIVE NEGATIVE Final    Comment: (NOTE) SARS-CoV-2 target nucleic acids are NOT DETECTED.  The SARS-CoV-2  RNA is generally detectable in upper respiratory specimens during the acute phase of infection. The lowest concentration of SARS-CoV-2 viral copies this assay can detect is 138 copies/mL. A negative result does not preclude SARS-Cov-2 infection and should not be used as the sole basis for treatment or other patient management decisions. A negative result may occur with  improper specimen collection/handling, submission of specimen other than nasopharyngeal swab, presence of viral mutation(s) within the areas targeted by this assay, and inadequate number of viral copies(<138 copies/mL). A negative result must be combined with clinical observations, patient history, and epidemiological information. The expected result is Negative.  Fact Sheet for Patients:  EntrepreneurPulse.com.au  Fact Sheet for Healthcare Providers:  IncredibleEmployment.be  This test is no t yet approved or cleared by the [place] FDA and  has been authorized for detection and/or diagnosis of SARS-CoV-2 by FDA under an Emergency Use Authorization (EUA). This EUA will remain  in effect (meaning this test can be used) for the duration of the COVID-19 declaration under Section 564(b)(1) of the Act, 21 U.S.C.section 360bbb-3(b)(1), unless the authorization is terminated  or revoked sooner.       Influenza A by PCR NEGATIVE NEGATIVE Final   Influenza B by PCR NEGATIVE NEGATIVE Final    Comment: (NOTE) The Xpert Xpress SARS-CoV-2/FLU/RSV plus assay is intended as an aid in the diagnosis of influenza from  Nasopharyngeal swab specimens and should not be used as a sole basis for treatment. Nasal washings and aspirates are unacceptable for Xpert Xpress SARS-CoV-2/FLU/RSV testing.  Fact Sheet for Patients: EntrepreneurPulse.com.au  Fact Sheet for Healthcare Providers: IncredibleEmployment.be  This test is not yet approved or cleared by the  [place] FDA and has been authorized for detection and/or diagnosis of SARS-CoV-2 by FDA under an Emergency Use Authorization (EUA). This EUA will remain in effect (meaning this test can be used) for the duration of the COVID-19 declaration under Section 564(b)(1) of the Act, 21 U.S.C. section 360bbb-3(b)(1), unless the authorization is terminated or revoked.  Performed at [facility], [place] [address] [place], [place] [address]   Urine Culture     Status: Abnormal   Collection Time: 05/03/21 10:20 AM   Specimen: Urine, Clean Catch  Result Value Ref Range Status   Specimen Description URINE, CLEAN CATCH  Final   Special Requests NONE  Final   Culture (A)  Final    >=100,000 COLONIES/mL LACTOBACILLUS SPECIES Standardized susceptibility testing for this organism is not available. Performed at [facility], [place] [address], [place], [place] [address]    Report Status 05/04/2021 FINAL  Final     Labs: BNP (last 3 results) No results for input(s): BNP in the last 8760 hours. Basic Metabolic Panel: Recent Labs  Lab 05/02/21 1929 05/02/21 1955 05/02/21 2045 05/03/21 0048 05/03/21 1029 05/04/21 0515 05/05/21 0623  NA 142 143 142 143 144 143 144  K 3.7 3.7 3.7 4.2 3.7 3.7 3.6  CL 105 108  --  105  --  109 108  CO2 26  --   --  28  --  28 30  GLUCOSE 127* 127*  --  130*  --  93 88  BUN 16 18  --  24*  --  29* 22  CREATININE 0.73 0.60  --  0.70  --  0.61 0.57  CALCIUM 10.0  --   --  9.6  --  9.2 9.2  MG  --   --   --   --   --  1.8 1.7  PHOS  --   --   --   --   --  3.1  --    Liver Function Tests: Recent Labs  Lab 05/02/21 1929 05/03/21 0048 05/04/21 0515  AST 20 17 16   ALT 13 13 12   ALKPHOS 78 74 58  BILITOT 0.8 0.8 0.4  PROT 7.8 7.2 6.3*  ALBUMIN 4.1 3.6 3.4*   Recent Labs  Lab 05/02/21 1929  LIPASE 23   Recent Labs  Lab 05/02/21 1929 05/03/21 1949 05/04/21 0515  AMMONIA 61* <9* 24   CBC: Recent Labs  Lab 05/02/21 1929  05/02/21 1955 05/02/21 2045 05/03/21 0048 05/03/21 1029 05/04/21 0515 05/05/21 0623  WBC 12.3*  --   --  9.3  --  6.9 7.0  NEUTROABS 9.7*  --   --  7.1  --  4.4 3.1  HGB 14.2   < > 14.3 12.5 11.9* 10.5* 11.1*  HCT 43.9   < > 42.0 39.4 35.0* 33.1* 34.8*  MCV 93.2  --   --  92.1  --  93.5 92.1  PLT 257  --   --  248  --  198 215   < > = values in this interval not displayed.   Cardiac Enzymes: Recent Labs  Lab 05/02/21 1929  CKTOTAL 101   BNP: Invalid input(s): POCBNP CBG:  Recent Labs  Lab 05/04/21 0623 05/04/21 1134 05/04/21 1741 05/04/21 2332 05/05/21 0530  GLUCAP 98 107* 164* 110* 92   D-Dimer No results for input(s): DDIMER in the last 72 hours. Hgb A1c No results for input(s): HGBA1C in the last 72 hours. Lipid Profile No results for input(s): CHOL, HDL, LDLCALC, TRIG, CHOLHDL, LDLDIRECT in the last 72 hours. Thyroid function studies Recent Labs    05/03/21 0048  TSH 0.562   Anemia work up No results for input(s): VITAMINB12, FOLATE, FERRITIN, TIBC, IRON, RETICCTPCT in the last 72 hours. Urinalysis    Component Value Date/Time   COLORURINE YELLOW 05/03/2021 0142   APPEARANCEUR CLEAR 05/03/2021 0142   LABSPEC 1.012 05/03/2021 0142   PHURINE 6.0 05/03/2021 0142   GLUCOSEU NEGATIVE 05/03/2021 0142   HGBUR NEGATIVE 05/03/2021 0142   BILIRUBINUR NEGATIVE 05/03/2021 0142   KETONESUR 5 (A) 05/03/2021 0142   PROTEINUR NEGATIVE 05/03/2021 0142   UROBILINOGEN 0.2 11/06/2009 1559   NITRITE NEGATIVE 05/03/2021 0142   LEUKOCYTESUR NEGATIVE 05/03/2021 0142   Sepsis Labs Invalid input(s): PROCALCITONIN,  WBC,  LACTICIDVEN Microbiology Recent Results (from the past 240 hour(s))  Resp Panel by RT-PCR (Flu A&B, Covid) Nasopharyngeal Swab     Status: None   Collection Time: 05/02/21  8:00 PM   Specimen: Nasopharyngeal Swab; Nasopharyngeal(NP) swabs in vial transport medium  Result Value Ref Range Status   SARS Coronavirus 2 by RT PCR NEGATIVE NEGATIVE Final     Comment: (NOTE) SARS-CoV-2 target nucleic acids are NOT DETECTED.  The SARS-CoV-2 RNA is generally detectable in upper respiratory specimens during the acute phase of infection. The lowest concentration of SARS-CoV-2 viral copies this assay can detect is 138 copies/mL. A negative result does not preclude SARS-Cov-2 infection and should not be used as the sole basis for treatment or other patient management decisions. A negative result may occur with  improper specimen collection/handling, submission of specimen other than nasopharyngeal swab, presence of viral mutation(s) within the areas targeted by this assay, and inadequate number of viral copies(<138 copies/mL). A negative result must be combined with clinical observations, patient history, and epidemiological information. The expected result is Negative.  Fact Sheet for Patients:  EntrepreneurPulse.com.au  Fact Sheet for Healthcare Providers:  IncredibleEmployment.be  This test is no t yet approved or cleared by the [place] FDA and  has been authorized for detection and/or diagnosis of SARS-CoV-2 by FDA under an Emergency Use Authorization (EUA). This EUA will remain  in effect (meaning this test can be used) for the duration of the COVID-19 declaration under Section 564(b)(1) of the Act, 21 U.S.C.section 360bbb-3(b)(1), unless the authorization is terminated  or revoked sooner.       Influenza A by PCR NEGATIVE NEGATIVE Final   Influenza B by PCR NEGATIVE NEGATIVE Final    Comment: (NOTE) The Xpert Xpress SARS-CoV-2/FLU/RSV plus assay is intended as an aid in the diagnosis of influenza from Nasopharyngeal swab specimens and should not be used as a sole basis for treatment. Nasal washings and aspirates are unacceptable for Xpert Xpress SARS-CoV-2/FLU/RSV testing.  Fact Sheet for Patients: EntrepreneurPulse.com.au  Fact Sheet for Healthcare  Providers: IncredibleEmployment.be  This test is not yet approved or cleared by the [place] FDA and has been authorized for detection and/or diagnosis of SARS-CoV-2 by FDA under an Emergency Use Authorization (EUA). This EUA will remain in effect (meaning this test can be used) for the duration of the COVID-19 declaration under Section 564(b)(1) of the Act, 21 U.S.C. section 360bbb-3(b)(1),  unless the authorization is terminated or revoked.  Performed at [facility], [place] [address], [place], [place] [address]   Urine Culture     Status: Abnormal   Collection Time: 05/03/21 10:20 AM   Specimen: Urine, Clean Catch  Result Value Ref Range Status   Specimen Description URINE, CLEAN CATCH  Final   Special Requests NONE  Final   Culture (A)  Final    >=100,000 COLONIES/mL LACTOBACILLUS SPECIES Standardized susceptibility testing for this organism is not available. Performed at [facility], [place] [address], [place], [place] [address]    Report Status 05/04/2021 FINAL  Final     Time coordinating discharge: 35 minutes  SIGNED:   Aline August, MD  [facility] 05/05/2021, 10:31 AM
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
## 2021-05-05 NOTE — TOC Transition Note (Signed)
Transition of Care [facility]) - CM/SW Discharge Note  Patient Details  Name: Tracy Park MRN: [id number] Date of Birth: 1956/04/12  Transition of Care [facility]) CM/SW Contact:  Sherie Don, LCSW Phone Number: 05/05/2021, 11:09 AM  Clinical Narrative: TOC received consult for SA and HH/DME. CSW spoke with patient regarding the consults. Per patient, she does not drink ETOH or use illicit substances so SA resources were declined at this time. Patient also reported she did not need HH and has a shower chair, rolling walker, cane, and grab bars in her shower at home so there are no DME needs at this time. TOC signing off.  Final next level of care: Home/Self Care Barriers to Discharge: No Barriers Identified  Patient Goals and CMS Choice Patient states their goals for this hospitalization and ongoing recovery are:: Return home CMS Medicare.gov Compare Post Acute Care list provided to:: Patient Choice offered to / list presented to : NA  Discharge Plan and Services         DME Arranged: N/A DME Agency: NA  Readmission Risk Interventions No flowsheet data found.

## 2021-08-11 ENCOUNTER — Other Ambulatory Visit: Payer: Self-pay

## 2021-08-11 ENCOUNTER — Emergency Department (HOSPITAL_COMMUNITY)
Admission: EM | Admit: 2021-08-11 | Discharge: 2021-08-11 | Disposition: A | Discharge status: Home / Self Care | Payer: Medicare Other | Attending: Emergency Medicine | Admitting: Emergency Medicine

## 2021-08-11 ENCOUNTER — Emergency Department (HOSPITAL_COMMUNITY): Payer: Medicare Other

## 2021-08-11 DIAGNOSIS — R059 Cough, unspecified: Secondary | ICD-10-CM | POA: Diagnosis present

## 2021-08-11 DIAGNOSIS — Z20822 Contact with and (suspected) exposure to covid-19: Secondary | ICD-10-CM | POA: Insufficient documentation

## 2021-08-11 DIAGNOSIS — F17210 Nicotine dependence, cigarettes, uncomplicated: Secondary | ICD-10-CM | POA: Insufficient documentation

## 2021-08-11 DIAGNOSIS — J111 Influenza due to unidentified influenza virus with other respiratory manifestations: Secondary | ICD-10-CM

## 2021-08-11 DIAGNOSIS — E039 Hypothyroidism, unspecified: Secondary | ICD-10-CM | POA: Insufficient documentation

## 2021-08-11 DIAGNOSIS — R000 Tachycardia, unspecified: Secondary | ICD-10-CM | POA: Insufficient documentation

## 2021-08-11 DIAGNOSIS — J101 Influenza due to other identified influenza virus with other respiratory manifestations: Secondary | ICD-10-CM | POA: Diagnosis not present

## 2021-08-11 DIAGNOSIS — Z7951 Long term (current) use of inhaled steroids: Secondary | ICD-10-CM | POA: Insufficient documentation

## 2021-08-11 DIAGNOSIS — J449 Chronic obstructive pulmonary disease, unspecified: Secondary | ICD-10-CM | POA: Insufficient documentation

## 2021-08-11 DIAGNOSIS — Z85118 Personal history of other malignant neoplasm of bronchus and lung: Secondary | ICD-10-CM | POA: Insufficient documentation

## 2021-08-11 DIAGNOSIS — Z79899 Other long term (current) drug therapy: Secondary | ICD-10-CM | POA: Diagnosis not present

## 2021-08-11 LAB — RESP PANEL BY RT-PCR (FLU A&B, COVID) ARPGX2
Influenza A by PCR: POSITIVE — AB
Influenza B by PCR: NEGATIVE
SARS Coronavirus 2 by RT PCR: NEGATIVE

## 2021-08-11 MED ORDER — IPRATROPIUM-ALBUTEROL 0.5-2.5 (3) MG/3ML IN SOLN
3.0000 mL | Freq: Once | RESPIRATORY_TRACT | Status: AC
  Administered 2021-08-11: 3 mL via RESPIRATORY_TRACT
  Filled 2021-08-11: qty 3

## 2021-08-11 NOTE — Discharge Instructions (Addendum)
You were seen here today for evaluation of your cough and cold symptoms. You tested positive for the flu. Please make sure to stay well hydrated with plenty of fluids, mainly water. You can take OTC cough and cold medications for your symptoms. You will need to follow up with your PCP in the next week to evaluate your symptoms and your condition. Additional information on the flu included in this discharge paperwork. Most importantly, you need to wear your at home oxygen. If you have any chest pain or SOB worse from your baseline, please return to the ED for evaluation.

## 2021-08-11 NOTE — ED Provider Notes (Signed)
[facility] DEPT Provider Note   CSN: [id number] Arrival date & time: 08/11/21  5643     History Chief Complaint  Patient presents with   Cough    Tracy Park is a 65 y.o. female smoker with h/o COPD presents to the ED for eval of flulike symptoms for the past 3 to 4 days.  Patient reports she has been having some myalgia along with some shortness of breath, slightly worse than her baseline, nonproductive cough, nasal congestion, rhinorrhea, and sinus headache.  She denies any abdominal pain, vomiting, fevers, chest pain, or sore throat.  No medications tried prior to arrival.  Family numbers recently diagnosed with flu.  The patient is post to be on 2 L nasal cannula at home, but when EMS arrived she was not on oxygen and her O2 sat was 87%.  When the patient was put on O2 her saturation increased to 98.     Cough Associated symptoms: myalgias, rhinorrhea and shortness of breath   Associated symptoms: no chest pain, no chills, no ear pain, no eye discharge, no fever, no rash and no sore throat       Past Medical History:  Diagnosis Date   Anginal pain ([place])    Anxiety    Bipolar disorder ([place])    Cancer ([place])    COPD (chronic obstructive pulmonary disease) ([place])    Dyspnea    Family history of adverse reaction to anesthesia    History of kidney stones    Hypothyroidism    Lung cancer ([place])    Myocardial infarction ([place])    PTSD (post-traumatic stress disorder)    Sleep apnea    Thyroid disease     Patient Active Problem List   Diagnosis Date Noted   Hydronephrosis 05/03/2021   Acute encephalopathy 05/02/2021   CAP (community acquired pneumonia) 04/10/2020   GERD (gastroesophageal reflux disease) 04/28/2012   Hypothyroidism (acquired) 04/28/2012   Nicotine abuse 04/28/2012   Depression 04/28/2012   Chest pain, atypical 04/28/2012    Past Surgical History:  Procedure Laterality Date   ABDOMINAL HYSTERECTOMY     BACK SURGERY      CYSTOSCOPY W/ URETERAL STENT PLACEMENT Right 05/03/2021   Procedure: CYSTOSCOPY WITH RETROGRADE PYELOGRAM/URETERAL STENT PLACEMENT;  Surgeon: Ardis Hughs, MD;  Location: [facility] ORS;  Service: Urology;  Laterality: Right;   EYE SURGERY     kidney stent     thyroidectomy       OB History   No obstetric history on file.     Family History  Family history unknown: Yes    Social History   Tobacco Use   Smoking status: Every Day    Types: Cigarettes   Smokeless tobacco: Never  Vaping Use   Vaping Use: Never used  Substance Use Topics   Alcohol use: Not Currently   Drug use: Not Currently    Home Medications Prior to Admission medications   Medication Sig Start Date End Date Taking? Authorizing Provider  albuterol (VENTOLIN HFA) 108 (90 Base) MCG/ACT inhaler Inhale 2 puffs into the lungs every 6 (six) hours as needed for wheezing or shortness of breath.  03/23/20   [provider]  Budeson-Glycopyrrol-Formoterol (BREZTRI AEROSPHERE) 160-9-4.8 MCG/ACT AERO Inhale 1 puff into the lungs daily.    [provider]  busPIRone (BUSPAR) 5 MG tablet Take 5 mg by mouth 3 (three) times daily.    [provider]  desvenlafaxine (PRISTIQ) 50 MG 24 hr tablet Take 50  mg by mouth daily.    [provider]  famotidine (PEPCID) 40 MG tablet Take 40 mg by mouth daily as needed for heartburn or indigestion.  02/14/20   [provider]  levothyroxine (SYNTHROID) 137 MCG tablet Take 137 mcg by mouth daily before breakfast. 03/14/20   [provider]  meloxicam (MOBIC) 15 MG tablet Take 15 mg by mouth daily. 04/07/20   [provider]  methocarbamol (ROBAXIN) 500 MG tablet Take 500 mg by mouth every 12 (twelve) hours as needed for muscle spasms.  02/10/20   [provider]  nitroGLYCERIN (NITROSTAT) 0.4 MG SL tablet Place 0.4 mg under the tongue every 5 (five) minutes as needed for chest pain.    [provider]  omeprazole  (PRILOSEC) 20 MG capsule Take 40 mg by mouth daily.    [provider]  ondansetron (ZOFRAN) 4 MG tablet Take 4 mg by mouth every 8 (eight) hours as needed for nausea or vomiting.  03/10/20   [provider]  oxybutynin (DITROPAN) 5 MG tablet Take 5 mg by mouth 3 (three) times daily. 03/15/20   [provider]  prazosin (MINIPRESS) 2 MG capsule Take 2 mg by mouth at bedtime. 01/11/20   [provider]  QUEtiapine (SEROQUEL) 300 MG tablet Take 300 mg by mouth at bedtime.    [provider]  rosuvastatin (CRESTOR) 20 MG tablet Take 20 mg by mouth daily. 01/15/20   [provider]  senna (SENOKOT) 8.6 MG TABS tablet Take 1 tablet by mouth 2 (two) times daily as needed for mild constipation.    [provider]  sertraline (ZOLOFT) 100 MG tablet Take 100 mg by mouth in the morning and at bedtime.    [provider]  tamsulosin (FLOMAX) 0.4 MG CAPS capsule Take 0.4 mg by mouth daily. 03/21/20   [provider]  umeclidinium bromide (INCRUSE ELLIPTA) 62.5 MCG/INH AEPB Inhale 1 puff into the lungs daily.    [provider]    Allergies    Red dye, Oxycontin [oxycodone hcl], Aspirin, and Tape  Review of Systems   Review of Systems  Constitutional:  Negative for chills and fever.  HENT:  Positive for congestion and rhinorrhea. Negative for drooling, ear pain, sore throat and trouble swallowing.   Eyes:  Negative for photophobia, discharge and visual disturbance.  Respiratory:  Positive for cough and shortness of breath.   Cardiovascular:  Negative for chest pain and palpitations.  Gastrointestinal:  Positive for nausea. Negative for abdominal pain, diarrhea and vomiting.  Genitourinary:  Negative for dysuria and hematuria.  Musculoskeletal:  Positive for myalgias. Negative for arthralgias, back pain and joint swelling.  Skin:  Negative for color change and rash.  Neurological:  Negative for syncope and weakness.    Physical Exam Updated Vital Signs BP 125/85 (BP Location: Right Arm)   Pulse (!) 106   Temp 98.4 F (36.9 C) (Oral)   Resp 18   Ht 5\' 3"  (1.6 m)   SpO2 92%   BMI 17.01 kg/m   Physical Exam Constitutional:      Appearance: Normal appearance.  HENT:     Head: Normocephalic and atraumatic.  Eyes:     General: No scleral icterus. Cardiovascular:     Rate and Rhythm: Regular rhythm. Tachycardia present.  Pulmonary:     Effort: Pulmonary effort is normal. No respiratory distress.     Breath sounds: Normal breath sounds. No wheezing.     Comments: The patient speaking  in full sentences with ease.  No respiratory distress, accessory muscle use, tripoding, nasal flaring, or cyanosis present.  Patient satting 92% on her at home oxygen rate. Abdominal:     General: Abdomen is flat. Bowel sounds are normal.     Palpations: Abdomen is soft.  Musculoskeletal:        General: No deformity.     Cervical back: Normal range of motion.  Skin:    General: Skin is warm and dry.  Neurological:     General: No focal deficit present.     Mental Status: She is alert. Mental status is at baseline.    ED Results / Procedures / Treatments   Labs (all labs ordered are listed, but only abnormal results are displayed) Labs Reviewed  RESP PANEL BY RT-PCR (FLU A&B, COVID) ARPGX2 - Abnormal; Notable for the following components:      Result Value   Influenza A by PCR POSITIVE (*)    All other components within normal limits    EKG None  Radiology DG Chest 2 View  Result Date: 08/11/2021 CLINICAL DATA:  Shortness of breath, cough and fever. EXAM: CHEST - 2 VIEW COMPARISON:  Chest x-rays dated 05/02/2021 and 04/09/2020 FINDINGS: Heart size and mediastinal contours are stable. Chronic scarring/fibrosis bilaterally. No confluent opacity to suggest a developing pneumonia. No pleural effusion or pneumothorax is seen. No acute-appearing osseous abnormality. IMPRESSION: 1. No active cardiopulmonary  disease. No evidence of pneumonia or pulmonary edema. 2. Chronic scarring/fibrosis within each lung. Lungs are hyperexpanded suggesting COPD. Electronically Signed   By: Franki Cabot M.D.   On: 08/11/2021 09:57    Procedures Procedures   Medications Ordered in ED Medications - No data to display  ED Course  I have reviewed the triage vital signs and the nursing notes.  Pertinent labs & imaging results that were available during my care of the patient were reviewed by me and considered in my medical decision making (see chart for details).  65 year old female with history of COPD presents emergency department for evaluation of flulike symptoms for the past 3 to 4 days.  Patient positive for flu.  States chest x-ray obtained and shows chronic scarring or fibrosis within each lung, but no evidence of pneumonia or pulmonary edema.  Hyperexpanded lungs suggesting of COPD.  Patient known history of COPD.  When EMS arrived, they reported some hypoxia but the patient was not wearing her oxygen.  During her stay here patient has repeatedly taken off her oxygen and gone down around 85 to 89%.  The patient reports that at home her O2 sat is usually around 87 to 88%.  Patient currently at 92% on her at home oxygen.  On physical exam, the patient is in no respiratory distress.  No retractions, accessory muscle use, cyanosis, or nasal flaring.  Patient speaking full sentences with ease.  I discussed with her that since she has been having the symptoms for greater than 48 hours, she is not a candidate for Tamiflu.  I recommended she take over-the-counter cough and cold medication in addition to obtaining plenty of rest and drinking fluids.  I discussed with her the importance of leaving on her oxygen as prescribed so that she can stay well.  Strict return precautions discussed.  Patient agrees with plan.  Patient is stable being discharged home in good condition.    MDM Rules/Calculators/A&P  Final Clinical Impression(s) / ED Diagnoses Final diagnoses:  Flu    Rx / DC Orders ED Discharge Orders     None        Sherrell Puller, PA-C 08/18/21 1839    Fredia Sorrow, MD 08/23/21 [phone]

## 2021-08-11 NOTE — ED Triage Notes (Signed)
Patient bib [facility], patient reports cough and fever x 2 days, denies n/v. Patient wears o2 continuously but did not wear o2 last night, ems reports patients 02 sat 87% upon arrival, patient was not wearing 02, patient o2 sat increased to 98%, patient reports other household members are sick as well. Patient reports whole body hurts, pain rated 8/10.

## 2021-08-16 ENCOUNTER — Inpatient Hospital Stay (HOSPITAL_COMMUNITY)
Admission: EM | Admit: 2021-08-16 | Discharge: 2021-08-25 | DRG: 871 | Disposition: A | Discharge status: Home / Self Care | Payer: Medicare Other | Attending: Internal Medicine | Admitting: Internal Medicine

## 2021-08-16 ENCOUNTER — Emergency Department (HOSPITAL_COMMUNITY): Payer: Medicare Other

## 2021-08-16 ENCOUNTER — Other Ambulatory Visit: Payer: Self-pay

## 2021-08-16 ENCOUNTER — Encounter (HOSPITAL_COMMUNITY): Payer: Self-pay

## 2021-08-16 DIAGNOSIS — E440 Moderate protein-calorie malnutrition: Secondary | ICD-10-CM

## 2021-08-16 DIAGNOSIS — M79602 Pain in left arm: Secondary | ICD-10-CM | POA: Diagnosis not present

## 2021-08-16 DIAGNOSIS — J1008 Influenza due to other identified influenza virus with other specified pneumonia: Secondary | ICD-10-CM | POA: Diagnosis present

## 2021-08-16 DIAGNOSIS — E871 Hypo-osmolality and hyponatremia: Secondary | ICD-10-CM | POA: Diagnosis present

## 2021-08-16 DIAGNOSIS — R7303 Prediabetes: Secondary | ICD-10-CM | POA: Diagnosis present

## 2021-08-16 DIAGNOSIS — Z7951 Long term (current) use of inhaled steroids: Secondary | ICD-10-CM

## 2021-08-16 DIAGNOSIS — R0602 Shortness of breath: Secondary | ICD-10-CM

## 2021-08-16 DIAGNOSIS — Z90710 Acquired absence of both cervix and uterus: Secondary | ICD-10-CM | POA: Diagnosis not present

## 2021-08-16 DIAGNOSIS — Z20822 Contact with and (suspected) exposure to covid-19: Secondary | ICD-10-CM | POA: Diagnosis present

## 2021-08-16 DIAGNOSIS — Z79899 Other long term (current) drug therapy: Secondary | ICD-10-CM

## 2021-08-16 DIAGNOSIS — E43 Unspecified severe protein-calorie malnutrition: Secondary | ICD-10-CM | POA: Diagnosis present

## 2021-08-16 DIAGNOSIS — E876 Hypokalemia: Secondary | ICD-10-CM | POA: Diagnosis present

## 2021-08-16 DIAGNOSIS — Z681 Body mass index (BMI) 19 or less, adult: Secondary | ICD-10-CM

## 2021-08-16 DIAGNOSIS — F319 Bipolar disorder, unspecified: Secondary | ICD-10-CM | POA: Diagnosis present

## 2021-08-16 DIAGNOSIS — E890 Postprocedural hypothyroidism: Secondary | ICD-10-CM | POA: Diagnosis present

## 2021-08-16 DIAGNOSIS — Z8249 Family history of ischemic heart disease and other diseases of the circulatory system: Secondary | ICD-10-CM

## 2021-08-16 DIAGNOSIS — K219 Gastro-esophageal reflux disease without esophagitis: Secondary | ICD-10-CM | POA: Diagnosis present

## 2021-08-16 DIAGNOSIS — N1330 Unspecified hydronephrosis: Secondary | ICD-10-CM

## 2021-08-16 DIAGNOSIS — J439 Emphysema, unspecified: Secondary | ICD-10-CM | POA: Diagnosis present

## 2021-08-16 DIAGNOSIS — J9621 Acute and chronic respiratory failure with hypoxia: Secondary | ICD-10-CM | POA: Diagnosis present

## 2021-08-16 DIAGNOSIS — Z85118 Personal history of other malignant neoplasm of bronchus and lung: Secondary | ICD-10-CM | POA: Diagnosis not present

## 2021-08-16 DIAGNOSIS — J09X1 Influenza due to identified novel influenza A virus with pneumonia: Secondary | ICD-10-CM | POA: Diagnosis not present

## 2021-08-16 DIAGNOSIS — Z9981 Dependence on supplemental oxygen: Secondary | ICD-10-CM | POA: Diagnosis not present

## 2021-08-16 DIAGNOSIS — Z66 Do not resuscitate: Secondary | ICD-10-CM | POA: Diagnosis present

## 2021-08-16 DIAGNOSIS — I2510 Atherosclerotic heart disease of native coronary artery without angina pectoris: Secondary | ICD-10-CM | POA: Diagnosis present

## 2021-08-16 DIAGNOSIS — Z9221 Personal history of antineoplastic chemotherapy: Secondary | ICD-10-CM

## 2021-08-16 DIAGNOSIS — F17210 Nicotine dependence, cigarettes, uncomplicated: Secondary | ICD-10-CM | POA: Diagnosis present

## 2021-08-16 DIAGNOSIS — A4189 Other specified sepsis: Secondary | ICD-10-CM | POA: Diagnosis not present

## 2021-08-16 DIAGNOSIS — Z801 Family history of malignant neoplasm of trachea, bronchus and lung: Secondary | ICD-10-CM

## 2021-08-16 DIAGNOSIS — I82612 Acute embolism and thrombosis of superficial veins of left upper extremity: Secondary | ICD-10-CM | POA: Diagnosis present

## 2021-08-16 DIAGNOSIS — Z79890 Hormone replacement therapy (postmenopausal): Secondary | ICD-10-CM

## 2021-08-16 DIAGNOSIS — F4310 Post-traumatic stress disorder, unspecified: Secondary | ICD-10-CM | POA: Diagnosis present

## 2021-08-16 DIAGNOSIS — M545 Low back pain, unspecified: Secondary | ICD-10-CM

## 2021-08-16 DIAGNOSIS — I252 Old myocardial infarction: Secondary | ICD-10-CM | POA: Diagnosis not present

## 2021-08-16 DIAGNOSIS — R739 Hyperglycemia, unspecified: Secondary | ICD-10-CM | POA: Diagnosis present

## 2021-08-16 DIAGNOSIS — J101 Influenza due to other identified influenza virus with other respiratory manifestations: Secondary | ICD-10-CM

## 2021-08-16 DIAGNOSIS — E785 Hyperlipidemia, unspecified: Secondary | ICD-10-CM | POA: Diagnosis present

## 2021-08-16 DIAGNOSIS — R609 Edema, unspecified: Secondary | ICD-10-CM | POA: Diagnosis not present

## 2021-08-16 DIAGNOSIS — T380X5A Adverse effect of glucocorticoids and synthetic analogues, initial encounter: Secondary | ICD-10-CM | POA: Diagnosis present

## 2021-08-16 DIAGNOSIS — A419 Sepsis, unspecified organism: Principal | ICD-10-CM

## 2021-08-16 HISTORY — DX: Unspecified hydronephrosis: N13.30

## 2021-08-16 LAB — COMPREHENSIVE METABOLIC PANEL
ALT: 21 U/L (ref 0–44)
AST: 20 U/L (ref 15–41)
Albumin: 2.6 g/dL — ABNORMAL LOW (ref 3.5–5.0)
Alkaline Phosphatase: 58 U/L (ref 38–126)
Anion gap: 10 (ref 5–15)
BUN: 13 mg/dL (ref 8–23)
CO2: 26 mmol/L (ref 22–32)
Calcium: 8.3 mg/dL — ABNORMAL LOW (ref 8.9–10.3)
Chloride: 96 mmol/L — ABNORMAL LOW (ref 98–111)
Creatinine, Ser: 0.74 mg/dL (ref 0.44–1.00)
GFR, Estimated: >60 mL/min (ref >60)
Glucose, Bld: 110 mg/dL — ABNORMAL HIGH (ref 70–99)
Potassium: 3.2 mmol/L — ABNORMAL LOW (ref 3.5–5.1)
Sodium: 132 mmol/L — ABNORMAL LOW (ref 135–145)
Total Bilirubin: 0.7 mg/dL (ref 0.3–1.2)
Total Protein: 6.3 g/dL — ABNORMAL LOW (ref 6.5–8.1)

## 2021-08-16 LAB — CBC WITH DIFFERENTIAL/PLATELET
Abs Immature Granulocytes: 0 10*3/uL (ref 0.00–0.07)
Basophils Absolute: 0 10*3/uL (ref 0.0–0.1)
Basophils Relative: 0 %
Eosinophils Absolute: 0 10*3/uL (ref 0.0–0.5)
Eosinophils Relative: 0 %
HCT: 35.8 % — ABNORMAL LOW (ref 36.0–46.0)
Hemoglobin: 12.1 g/dL (ref 12.0–15.0)
Lymphocytes Relative: 12 %
Lymphs Abs: 0.8 10*3/uL (ref 0.7–4.0)
MCH: 29 pg (ref 26.0–34.0)
MCHC: 33.8 g/dL (ref 30.0–36.0)
MCV: 85.9 fL (ref 80.0–100.0)
Monocytes Absolute: 0.6 10*3/uL (ref 0.1–1.0)
Monocytes Relative: 8 %
Neutro Abs: 5.6 10*3/uL (ref 1.7–7.7)
Neutrophils Relative %: 80 %
Platelets: 246 10*3/uL (ref 150–400)
RBC: 4.17 MIL/uL (ref 3.87–5.11)
RDW: 14.3 % (ref 11.5–15.5)
WBC: 7 10*3/uL (ref 4.0–10.5)
nRBC: 0 % (ref 0.0–0.2)
nRBC: 0 /100 WBC

## 2021-08-16 LAB — RESP PANEL BY RT-PCR (FLU A&B, COVID) ARPGX2
Influenza A by PCR: POSITIVE — AB
Influenza B by PCR: NEGATIVE
SARS Coronavirus 2 by RT PCR: NEGATIVE

## 2021-08-16 LAB — MAGNESIUM: Magnesium: 1.6 mg/dL — ABNORMAL LOW (ref 1.7–2.4)

## 2021-08-16 LAB — LIPASE, BLOOD: Lipase: 25 U/L (ref 11–51)

## 2021-08-16 LAB — LACTIC ACID, PLASMA: Lactic Acid, Venous: 0.8 mmol/L (ref 0.5–1.9)

## 2021-08-16 MED ORDER — SODIUM CHLORIDE 0.9 % IV SOLN
2.0000 g | Freq: Once | INTRAVENOUS | Status: AC
  Administered 2021-08-16: 2 g via INTRAVENOUS
  Filled 2021-08-16: qty 2

## 2021-08-16 MED ORDER — SODIUM CHLORIDE 0.9 % IV SOLN: 1.0000 g | Freq: Once | INTRAVENOUS | Status: DC

## 2021-08-16 MED ORDER — TAMSULOSIN HCL 0.4 MG PO CAPS
0.4000 mg | ORAL_CAPSULE | Freq: Every day | ORAL | Status: DC
  Administered 2021-08-17 – 2021-08-25 (×9): 0.4 mg via ORAL
  Filled 2021-08-16 (×9): qty 1

## 2021-08-16 MED ORDER — LACTATED RINGERS IV SOLN: INTRAVENOUS | Status: AC

## 2021-08-16 MED ORDER — SODIUM CHLORIDE 0.9 % IV SOLN: 2.0000 g | INTRAVENOUS | Status: DC

## 2021-08-16 MED ORDER — VANCOMYCIN HCL 750 MG/150ML IV SOLN
750.0000 mg | INTRAVENOUS | Status: DC
Start: 2021-08-17 — End: 2021-08-17

## 2021-08-16 MED ORDER — POTASSIUM CHLORIDE CRYS ER 20 MEQ PO TBCR
40.0000 meq | EXTENDED_RELEASE_TABLET | Freq: Once | ORAL | Status: AC
  Administered 2021-08-16: 40 meq via ORAL
  Filled 2021-08-16: qty 2

## 2021-08-16 MED ORDER — SERTRALINE HCL 100 MG PO TABS
100.0000 mg | ORAL_TABLET | Freq: Two times a day (BID) | ORAL | Status: DC
  Administered 2021-08-16 – 2021-08-25 (×17): 100 mg via ORAL
  Filled 2021-08-16 (×17): qty 1

## 2021-08-16 MED ORDER — ENOXAPARIN SODIUM 40 MG/0.4ML IJ SOSY
40.0000 mg | PREFILLED_SYRINGE | Freq: Every day | INTRAMUSCULAR | Status: DC
  Filled 2021-08-16 (×5): qty 0.4

## 2021-08-16 MED ORDER — ACETAMINOPHEN 500 MG PO TABS
1000.0000 mg | ORAL_TABLET | Freq: Once | ORAL | Status: AC
  Administered 2021-08-16: 1000 mg via ORAL
  Filled 2021-08-16: qty 2

## 2021-08-16 MED ORDER — PANTOPRAZOLE SODIUM 40 MG PO TBEC
40.0000 mg | DELAYED_RELEASE_TABLET | Freq: Every day | ORAL | Status: DC
  Administered 2021-08-17 – 2021-08-25 (×9): 40 mg via ORAL
  Filled 2021-08-16 (×9): qty 1

## 2021-08-16 MED ORDER — ROSUVASTATIN CALCIUM 20 MG PO TABS
20.0000 mg | ORAL_TABLET | Freq: Every day | ORAL | Status: DC
  Administered 2021-08-17 – 2021-08-25 (×9): 20 mg via ORAL
  Filled 2021-08-16 (×9): qty 1

## 2021-08-16 MED ORDER — LACTATED RINGERS IV BOLUS (SEPSIS)
1000.0000 mL | Freq: Once | INTRAVENOUS | Status: AC
  Administered 2021-08-16: 1000 mL via INTRAVENOUS

## 2021-08-16 MED ORDER — FAMOTIDINE 20 MG PO TABS
40.0000 mg | ORAL_TABLET | Freq: Every day | ORAL | Status: DC
  Administered 2021-08-17 – 2021-08-25 (×9): 40 mg via ORAL
  Filled 2021-08-16 (×9): qty 2

## 2021-08-16 MED ORDER — ALBUTEROL SULFATE (2.5 MG/3ML) 0.083% IN NEBU
2.5000 mg | INHALATION_SOLUTION | Freq: Four times a day (QID) | RESPIRATORY_TRACT | Status: DC | PRN
  Administered 2021-08-17 – 2021-08-25 (×8): 2.5 mg via RESPIRATORY_TRACT
  Filled 2021-08-16 (×10): qty 3

## 2021-08-16 MED ORDER — BUSPIRONE HCL 5 MG PO TABS
5.0000 mg | ORAL_TABLET | Freq: Three times a day (TID) | ORAL | Status: DC
  Administered 2021-08-16 – 2021-08-25 (×27): 5 mg via ORAL
  Filled 2021-08-16 (×26): qty 1

## 2021-08-16 MED ORDER — VENLAFAXINE HCL ER 75 MG PO CP24
75.0000 mg | ORAL_CAPSULE | Freq: Every day | ORAL | Status: DC
  Administered 2021-08-17 – 2021-08-25 (×9): 75 mg via ORAL
  Filled 2021-08-16 (×10): qty 1

## 2021-08-16 MED ORDER — OXYBUTYNIN CHLORIDE 5 MG PO TABS
5.0000 mg | ORAL_TABLET | Freq: Three times a day (TID) | ORAL | Status: DC
  Administered 2021-08-16 – 2021-08-25 (×26): 5 mg via ORAL
  Filled 2021-08-16 (×27): qty 1

## 2021-08-16 MED ORDER — LACTATED RINGERS IV BOLUS (SEPSIS)
500.0000 mL | Freq: Once | INTRAVENOUS | Status: AC
  Administered 2021-08-16: 500 mL via INTRAVENOUS

## 2021-08-16 MED ORDER — LACTATED RINGERS IV SOLN: INTRAVENOUS | Status: DC

## 2021-08-16 MED ORDER — OSELTAMIVIR PHOSPHATE 75 MG PO CAPS: 75.0000 mg | ORAL_CAPSULE | Freq: Two times a day (BID) | ORAL | Status: DC

## 2021-08-16 MED ORDER — SODIUM CHLORIDE 0.9 % IV SOLN
2.0000 g | Freq: Two times a day (BID) | INTRAVENOUS | Status: DC
  Administered 2021-08-17 – 2021-08-19 (×5): 2 g via INTRAVENOUS
  Filled 2021-08-16 (×5): qty 2

## 2021-08-16 MED ORDER — OSELTAMIVIR PHOSPHATE 30 MG PO CAPS
30.0000 mg | ORAL_CAPSULE | Freq: Two times a day (BID) | ORAL | Status: AC
  Administered 2021-08-16 – 2021-08-21 (×10): 30 mg via ORAL
  Filled 2021-08-16 (×11): qty 1

## 2021-08-16 MED ORDER — MAGNESIUM OXIDE -MG SUPPLEMENT 400 (240 MG) MG PO TABS
400.0000 mg | ORAL_TABLET | Freq: Once | ORAL | Status: AC
  Administered 2021-08-16: 400 mg via ORAL
  Filled 2021-08-16: qty 1

## 2021-08-16 MED ORDER — DM-GUAIFENESIN ER 30-600 MG PO TB12
1.0000 | ORAL_TABLET | Freq: Two times a day (BID) | ORAL | Status: DC
  Administered 2021-08-17 – 2021-08-18 (×4): 1 via ORAL
  Filled 2021-08-16 (×6): qty 1

## 2021-08-16 MED ORDER — QUETIAPINE FUMARATE 100 MG PO TABS
300.0000 mg | ORAL_TABLET | Freq: Every day | ORAL | Status: DC
  Administered 2021-08-16 – 2021-08-24 (×9): 300 mg via ORAL
  Filled 2021-08-16 (×2): qty 3
  Filled 2021-08-16: qty 1
  Filled 2021-08-16: qty 3
  Filled 2021-08-16: qty 1
  Filled 2021-08-16 (×5): qty 3

## 2021-08-16 MED ORDER — AZITHROMYCIN 500 MG IV SOLR
500.0000 mg | INTRAVENOUS | Status: DC
Start: 2021-08-16 — End: 2021-08-16

## 2021-08-16 MED ORDER — MOMETASONE FURO-FORMOTEROL FUM 200-5 MCG/ACT IN AERO
2.0000 | INHALATION_SPRAY | Freq: Two times a day (BID) | RESPIRATORY_TRACT | Status: DC
  Administered 2021-08-17 – 2021-08-25 (×17): 2 via RESPIRATORY_TRACT
  Filled 2021-08-16: qty 8.8

## 2021-08-16 MED ORDER — IOHEXOL 300 MG/ML  SOLN
80.0000 mL | Freq: Once | INTRAMUSCULAR | Status: AC | PRN
  Administered 2021-08-16: 80 mL via INTRAVENOUS

## 2021-08-16 MED ORDER — VANCOMYCIN HCL IN DEXTROSE 1-5 GM/200ML-% IV SOLN
1000.0000 mg | Freq: Once | INTRAVENOUS | Status: AC
  Administered 2021-08-16: 1000 mg via INTRAVENOUS
  Filled 2021-08-16: qty 200

## 2021-08-16 MED ORDER — LEVOTHYROXINE SODIUM 25 MCG PO TABS
137.0000 ug | ORAL_TABLET | Freq: Every day | ORAL | Status: DC
  Administered 2021-08-17 – 2021-08-25 (×9): 137 ug via ORAL
  Filled 2021-08-16 (×10): qty 1

## 2021-08-16 NOTE — Progress Notes (Signed)
Elink monitoring code sepsis 

## 2021-08-16 NOTE — ED Provider Notes (Signed)
[facility] EMERGENCY DEPARTMENT Provider Note   CSN: [id number] Arrival date & time: 08/16/21  1709     History Chief Complaint  Patient presents with   Code Sepsis    Tracy Park is a 65 y.o. female.  HPI  65 year old female with a history of bipolar disorder, COPD on 2 L O2 chronically, hypothyroidism, lung cancer status post lobar resection, presenting to the emergency department with concern for sepsis.  The patient states that she has been febrile at home to 101 with worsening shortness of breath since this past Monday.  She endorses a mildly productive cough.  She called out EMS due to worsening shortness of breath the day.  Per EMS, the patient was 87% on room air.  Her baseline is normally 2 L O2 via nasal cannula she was saturating well on that.  She presented febrile to 100.7, tachycardic to the 130s, improved to 116 after a 500 cc NS bolus.  She was most recently seen in the emergency department on 12/22 during which time she was diagnosed with influenza.  Past Medical History:  Diagnosis Date   Anginal pain ([place])    Anxiety    Bipolar disorder ([place])    Cancer ([place])    COPD (chronic obstructive pulmonary disease) ([place])    Dyspnea    Family history of adverse reaction to anesthesia    History of kidney stones    Hydroureteronephrosis 08/16/2021   Hypothyroidism    Lung cancer ([place])    Myocardial infarction ([place])    PTSD (post-traumatic stress disorder)    Sleep apnea    Thyroid disease     Patient Active Problem List   Diagnosis Date Noted   Influenza A with pneumonia 08/16/2021   Sepsis ([place]) 08/16/2021   Hyponatremia 08/16/2021   Hypokalemia 08/16/2021   Hypomagnesemia 08/16/2021   Hydronephrosis 05/03/2021   Acute encephalopathy 05/02/2021   CAP (community acquired pneumonia) 04/10/2020   GERD (gastroesophageal reflux disease) 04/28/2012   Hypothyroidism (acquired) 04/28/2012   Nicotine abuse 04/28/2012   Depression 04/28/2012    Chest pain, atypical 04/28/2012    Past Surgical History:  Procedure Laterality Date   ABDOMINAL HYSTERECTOMY     BACK SURGERY     CYSTOSCOPY W/ URETERAL STENT PLACEMENT Right 05/03/2021   Procedure: CYSTOSCOPY WITH RETROGRADE PYELOGRAM/URETERAL STENT PLACEMENT;  Surgeon: Ardis Hughs, MD;  Location: [facility];  Service: Urology;  Laterality: Right;   EYE SURGERY     kidney stent     thyroidectomy       OB History   No obstetric history on file.     Family History  Family history unknown: Yes    Social History   Tobacco Use   Smoking status: Every Day    Types: Cigarettes   Smokeless tobacco: Never  Vaping Use   Vaping Use: Never used  Substance Use Topics   Alcohol use: Not Currently   Drug use: Not Currently    Home Medications Prior to Admission medications   Medication Sig Start Date End Date Taking? Authorizing Provider  albuterol (PROVENTIL) (2.5 MG/3ML) 0.083% nebulizer solution Take 2.5 mg by nebulization every 6 (six) hours as needed for wheezing or shortness of breath.   Yes [provider]  albuterol (VENTOLIN HFA) 108 (90 Base) MCG/ACT inhaler Inhale 2 puffs into the lungs every 6 (six) hours as needed for wheezing or shortness of breath.  03/23/20  Yes [provider]  budesonide-formoterol (SYMBICORT) 160-4.5 MCG/ACT inhaler Inhale  1 puff into the lungs in the morning and at bedtime. 07/11/21  Yes [provider]  busPIRone (BUSPAR) 5 MG tablet Take 5 mg by mouth 3 (three) times daily.   Yes [provider]  Cholecalciferol 25 MCG (1000 UT) tablet Take 1,000 Units by mouth daily.   Yes [provider]  desvenlafaxine (PRISTIQ) 50 MG 24 hr tablet Take 50 mg by mouth daily.   Yes [provider]  famotidine (PEPCID) 40 MG tablet Take 40 mg by mouth daily. 02/14/20  Yes [provider]  levothyroxine (SYNTHROID) 137 MCG tablet Take 137 mcg by mouth daily before breakfast. 03/14/20  Yes [provider]  meloxicam (MOBIC) 15 MG tablet Take 15 mg by mouth daily. 04/07/20  Yes [provider]  methocarbamol (ROBAXIN) 500 MG tablet Take 500 mg by mouth daily. 02/10/20  Yes [provider]  Multiple Vitamins-Minerals (CENTRUM WOMEN) TABS Take 1 tablet by mouth daily.   Yes [provider]  nitroGLYCERIN (NITROSTAT) 0.4 MG SL tablet Place 0.4 mg under the tongue every 5 (five) minutes as needed for chest pain.   Yes [provider]  omeprazole (PRILOSEC) 20 MG capsule Take 40 mg by mouth daily.   Yes [provider]  ondansetron (ZOFRAN) 4 MG tablet Take 4 mg by mouth daily. 03/10/20  Yes [provider]  oxybutynin (DITROPAN) 5 MG tablet Take 5 mg by mouth 3 (three) times daily. 03/15/20  Yes [provider]  OXYGEN Inhale 2 L into the lungs continuous.   Yes [provider]  QUEtiapine (SEROQUEL) 300 MG tablet Take 300 mg by mouth at bedtime.   Yes [provider]  rosuvastatin (CRESTOR) 20 MG tablet Take 20 mg by mouth daily. 01/15/20  Yes [provider]  senna (SENOKOT) 8.6 MG TABS tablet Take 1 tablet by mouth 2 (two) times daily as needed for mild constipation.   Yes [provider]  sertraline (ZOLOFT) 100 MG tablet Take 100 mg by mouth in the morning and at bedtime.   Yes [provider]  tamsulosin (FLOMAX) 0.4 MG CAPS capsule Take 0.4 mg by mouth daily. 03/21/20  Yes [provider]    Allergies    Red dye, Oxycontin [oxycodone hcl], Aspirin, and Tape  Review of Systems   Review of Systems  Constitutional:  Positive for chills, fatigue and fever.  Respiratory:  Positive for cough and shortness of breath.   All other systems reviewed and are negative.  Physical Exam Updated Vital Signs BP 130/69   Pulse 93   Temp 97.7 F (36.5 C) (Oral)   Resp (!) 30   Ht 5\' 3"  (1.6 m)   Wt 45.4 kg   SpO2 99%   BMI 17.71 kg/m   Physical Exam Vitals and nursing note  reviewed.  Constitutional:      General: She is not in acute distress.    Appearance: She is well-developed.  HENT:     Head: Normocephalic and atraumatic.  Eyes:     Conjunctiva/sclera: Conjunctivae normal.     Pupils: Pupils are equal, round, and reactive to light.  Cardiovascular:     Rate and Rhythm: Normal rate and regular rhythm.     Heart sounds: No murmur heard. Pulmonary:     Effort: Pulmonary effort is normal. No respiratory distress.     Breath sounds: Examination of the right-upper field reveals decreased breath sounds. Examination of the right-middle field reveals decreased breath sounds. Examination of the  right-lower field reveals decreased breath sounds. Decreased breath sounds present.  Abdominal:     General: There is no distension.     Palpations: Abdomen is soft.     Tenderness: There is no abdominal tenderness. There is no guarding.  Musculoskeletal:        General: No swelling, deformity or signs of injury.     Cervical back: Neck supple.  Skin:    General: Skin is warm and dry.     Capillary Refill: Capillary refill takes less than 2 seconds.     Findings: No lesion or rash.  Neurological:     General: No focal deficit present.     Mental Status: She is alert. Mental status is at baseline.  Psychiatric:        Mood and Affect: Mood normal.    ED Results / Procedures / Treatments   Labs (all labs ordered are listed, but only abnormal results are displayed) Labs Reviewed  RESP PANEL BY RT-PCR (FLU A&B, COVID) ARPGX2 - Abnormal; Notable for the following components:      Result Value   Influenza A by PCR POSITIVE (*)    All other components within normal limits  COMPREHENSIVE METABOLIC PANEL - Abnormal; Notable for the following components:   Sodium 132 (*)    Potassium 3.2 (*)    Chloride 96 (*)    Glucose, Bld 110 (*)    Calcium 8.3 (*)    Total Protein 6.3 (*)    Albumin 2.6 (*)    All other components within normal limits  CBC WITH  DIFFERENTIAL/PLATELET - Abnormal; Notable for the following components:   HCT 35.8 (*)    All other components within normal limits  URINALYSIS, ROUTINE W REFLEX MICROSCOPIC - Abnormal; Notable for the following components:   Specific Gravity, Urine <1.005 (*)    All other components within normal limits  MAGNESIUM - Abnormal; Notable for the following components:   Magnesium 1.6 (*)    All other components within normal limits  BASIC METABOLIC PANEL - Abnormal; Notable for the following components:   Glucose, Bld 103 (*)    Calcium 8.4 (*)    All other components within normal limits  MRSA NEXT GEN BY PCR, NASAL  CULTURE, BLOOD (ROUTINE X 2)  CULTURE, BLOOD (ROUTINE X 2)  LACTIC ACID, PLASMA  LACTIC ACID, PLASMA  LIPASE, BLOOD  PROCALCITONIN  MAGNESIUM  HIV ANTIBODY (ROUTINE TESTING W REFLEX)  STREP PNEUMONIAE URINARY ANTIGEN  LEGIONELLA PNEUMOPHILA SEROGP 1 UR AG    EKG EKG Interpretation  Date/Time:  Thursday August 16 2021 17:13:26 EST Ventricular Rate:  112 PR Interval:  123 QRS Duration: 108 QT Interval:  330 QTC Calculation: 451 R Axis:   72 Text Interpretation: Sinus tachycardia Atrial premature complex Biatrial enlargement RSR' in V1 or V2, right VCD or RVH Left ventricular hypertrophy Confirmed by Regan Lemming ([id number]) on 08/16/2021 5:32:17 PM  Radiology CT ABDOMEN PELVIS W CONTRAST  Result Date: 08/16/2021 CLINICAL DATA:  Sepsis, acute abdominal pain. History of lung cancer. EXAM: CT ABDOMEN AND PELVIS WITH CONTRAST TECHNIQUE: Multidetector CT imaging of the abdomen and pelvis was performed using the standard protocol following bolus administration of intravenous contrast. CONTRAST:  19mL OMNIPAQUE IOHEXOL 300 MG/ML  SOLN COMPARISON:  CT renal stone 05/03/2021. FINDINGS: Lower chest: There are patchy multifocal ground-glass and airspace opacities within the lung bases, left greater than right. Hepatobiliary: No focal liver abnormality is seen. No gallstones,  gallbladder wall thickening, or biliary dilatation. Pancreas:  Unremarkable. No pancreatic ductal dilatation or surrounding inflammatory changes. Spleen: Normal in size without focal abnormality. Adrenals/Urinary Tract: There is mild right-sided hydroureteronephrosis. No definite obstructing calculus identified. Bladder, left kidney and adrenal glands are within normal limits. Stomach/Bowel: Stomach is within normal limits. Appendix is not seen. No evidence of bowel wall thickening, distention, or inflammatory changes. Vascular/Lymphatic: Aortic atherosclerosis. No enlarged abdominal or pelvic lymph nodes. Reproductive: Status post hysterectomy. No adnexal masses. Other: There is trace free fluid in the pelvis. There is no focal abdominal wall hernia. Musculoskeletal: There is stable grade 1 anterolisthesis at L4-L5, likely degenerative. IMPRESSION: 1. Bilateral lower lung multifocal ground-glass and airspace opacities worrisome for infection. 2. Mild right-sided hydroureteronephrosis without obstructing calculus identified. Findings may be related to recently passed calculus (nonvisualized) or ureteral stricture/mass. Recommend clinical correlation and follow-up. 3. Trace free fluid in the pelvis. 4.  Aortic Atherosclerosis (ICD10-I70.0). Electronically Signed   By: Ronney Asters M.D.   On: 08/16/2021 20:44   DG Chest Port 1 View  Result Date: 08/16/2021 CLINICAL DATA:  Shortness of breath and fever. History of lung cancer EXAM: PORTABLE CHEST 1 VIEW COMPARISON:  Chest x-ray 08/11/2021, CT chest 04/09/2020 FINDINGS: The heart and mediastinal contours are within normal limits. Right to left mediastinal shift. Left lung surgical changes. Biapical pleural/pulmonary scarring. No focal consolidation. Chronic coarsened interstitial markings with overlying patchy airspace opacity along the left lower lobe. Similar finding along the right lower lobe. Persistent blunting of the left costophrenic angle with possible  overlying trace left pleural effusion. No right pleural effusion. No acute osseous abnormality. IMPRESSION: Emphysema ([id number]) with liky superimposed bilateral lower lobe infection/inflammation. Possible trace left pleural effusion. Electronically Signed   By: Iven Finn M.D.   On: 08/16/2021 17:50    Procedures .Critical Care Performed by: Regan Lemming, MD Authorized by: Regan Lemming, MD   Critical care provider statement:    Critical care time (minutes):  30   Critical care was necessary to treat or prevent imminent or life-threatening deterioration of the following conditions:  Sepsis   Critical care was time spent personally by me on the following activities:  Examination of patient, evaluation of patient's response to treatment, obtaining history from patient or surrogate, ordering and performing treatments and interventions, ordering and review of laboratory studies, ordering and review of radiographic studies, pulse oximetry and re-evaluation of patient's condition   Care discussed with: admitting provider     Medications Ordered in ED Medications  lactated ringers infusion ( Intravenous New Bag/Given 08/16/21 2343)  busPIRone (BUSPAR) tablet 5 mg (5 mg Oral Given 08/17/21 0829)  venlafaxine XR (EFFEXOR-XR) 24 hr capsule 75 mg (75 mg Oral Given 08/17/21 0829)  QUEtiapine (SEROQUEL) tablet 300 mg (300 mg Oral Given 08/16/21 2336)  sertraline (ZOLOFT) tablet 100 mg (100 mg Oral Not Given 08/17/21 0851)  rosuvastatin (CRESTOR) tablet 20 mg (20 mg Oral Given 08/17/21 0829)  levothyroxine (SYNTHROID) tablet 137 mcg (137 mcg Oral Given 08/17/21 0650)  famotidine (PEPCID) tablet 40 mg (40 mg Oral Given 08/17/21 0830)  pantoprazole (PROTONIX) EC tablet 40 mg (40 mg Oral Given 08/17/21 0829)  oxybutynin (DITROPAN) tablet 5 mg (5 mg Oral Given 08/17/21 0851)  tamsulosin (FLOMAX) capsule 0.4 mg (0.4 mg Oral Given 08/17/21 0829)  albuterol (PROVENTIL) (2.5 MG/3ML) 0.083% nebulizer solution  2.5 mg (2.5 mg Inhalation Given 08/17/21 0830)  mometasone-formoterol (DULERA) 200-5 MCG/ACT inhaler 2 puff (2 puffs Inhalation Given 08/17/21 0936)  enoxaparin (LOVENOX) injection 40 mg (40 mg Subcutaneous Patient Refused/Not Given  08/16/21 2338)  ceFEPIme (MAXIPIME) 2 g in sodium chloride 0.9 % 100 mL IVPB (0 g Intravenous Stopped 08/17/21 0731)  oseltamivir (TAMIFLU) capsule 30 mg (30 mg Oral Given 08/17/21 0828)  dextromethorphan-guaiFENesin (MUCINEX DM) 30-600 MG per 12 hr tablet 1 tablet (1 tablet Oral Given 08/17/21 0830)  acetaminophen (TYLENOL) tablet 1,000 mg (1,000 mg Oral Given 08/16/21 1803)  lactated ringers bolus 1,000 mL (0 mLs Intravenous Stopped 08/16/21 2018)    And  lactated ringers bolus 500 mL (0 mLs Intravenous Stopped 08/16/21 2021)  ceFEPIme (MAXIPIME) 2 g in sodium chloride 0.9 % 100 mL IVPB (0 g Intravenous Stopped 08/16/21 1926)  potassium chloride SA (KLOR-CON M) CR tablet 40 mEq (40 mEq Oral Given 08/16/21 2052)  vancomycin (VANCOCIN) IVPB 1000 mg/200 mL premix (0 mg Intravenous Stopped 08/16/21 2202)  iohexol (OMNIPAQUE) 300 MG/ML solution 80 mL (80 mLs Intravenous Contrast Given 08/16/21 2029)  magnesium oxide (MAG-OX) tablet 400 mg (400 mg Oral Given 08/16/21 2051)    ED Course  I have reviewed the triage vital signs and the nursing notes.  Pertinent labs & imaging results that were available during my care of the patient were reviewed by me and considered in my medical decision making (see chart for details).  Clinical Course as of 08/17/21 1000  Thu Aug 16, 2021  2126 Influenza A By PCR(!): POSITIVE [JL]    Clinical Course User Index [JL] Regan Lemming, MD   MDM Rules/Calculators/A&P                           65 year old female with a history of bipolar disorder, COPD on 2 L O2 chronically, hypothyroidism, lung cancer status post lobar resection, presenting to the emergency department with concern for sepsis.  The patient states that she has been febrile at home  to 101 with worsening shortness of breath since this past Monday.  She endorses a mildly productive cough.  She called out EMS due to worsening shortness of breath the day.  Per EMS, the patient was 87% on room air.  Her baseline is normally 2 L O2 via nasal cannula she was saturating well on that.  She presented febrile to 100.7, tachycardic to the 130s, improved to 116 after a 500 cc NS bolus.  She was most recently seen in the emergency department on 12/22 during which time she was diagnosed with influenza.  Tracy Park presents with sepsis as per above.  Her exam is most notable for decreased breath sounds, mild tachypnea, tachycardia.  My immediate concern is for a life-threatening infection in this patient. Evaluation and treatment for sepsis began on my immediate evaluation  The most likely infectious source is respiratory.  The patient denies IVDU. I have a low suspicion for meningitis, osteomyelitis, endocarditis, pyelonephritis, proctitis, PID. Thorough skin exam revealed no significant open wounds.  Labs: Urinalysis without evidence of UTI, including PCR positive, CBC without a cytosis, anemia or platelet abnormality, CMP with mild hypokalemia, magnesium normal, lactic acid normal.  Blood cultures and urine cultures collected and pending.  Is presenting with productive cough, worsening shortness of breath, fevers concerning for developing new.   Imaging: CXR withEmphysema with superimposed bilateral lower lobe action/inflammation with a past trace left pleural effusion. CT abdomen pelvis with bilateral lower lung multifocal groundglass airspace opacities worrisome for infection, mild right-sided hydroureteronephrosis without obstructing calculus.   For treatment, the patient was given 30cc/kg LR for IVF and Vancomycin and Cefipime for antimicrobials.  Upon reassessment, the patient appears clinically improved. The patient is not currently hypotensive and has a MAP of >65. The  patient's volume status appears to be improved after fluid resuscitation with improvement in her tachycardia.  Based off the presentation and lab findings, the patient meets criteria for sepsis with a likely respiratory source.  [SEVERE SEPSIS] HYPOTENSION (Nursing BPA for hypotension = Is this sepsis?) CREATININE > 2.0, or urine output < 0.5 mL/kg/hour for 2 hours  BILIRUBIN > 2 mg/dL (34.2 mmol/L)  PLATELET COUNT < 100,000  INR > 1.5 or aPTT > 60 sec  LACTATE > 2 mmol/L   [SEPTIC SHOCK] HYPOTENSION REFRACTORY TO 30 cc/kg OF FLUIDS LACTATE > 4 mmol/L  While in the ED, the patient was given: Medications  lactated ringers infusion ( Intravenous New Bag/Given 08/16/21 2343)  busPIRone (BUSPAR) tablet 5 mg (5 mg Oral Given 08/17/21 0829)  venlafaxine XR (EFFEXOR-XR) 24 hr capsule 75 mg (75 mg Oral Given 08/17/21 0829)  QUEtiapine (SEROQUEL) tablet 300 mg (300 mg Oral Given 08/16/21 2336)  sertraline (ZOLOFT) tablet 100 mg (100 mg Oral Not Given 08/17/21 0851)  rosuvastatin (CRESTOR) tablet 20 mg (20 mg Oral Given 08/17/21 0829)  levothyroxine (SYNTHROID) tablet 137 mcg (137 mcg Oral Given 08/17/21 0650)  famotidine (PEPCID) tablet 40 mg (40 mg Oral Given 08/17/21 0830)  pantoprazole (PROTONIX) EC tablet 40 mg (40 mg Oral Given 08/17/21 0829)  oxybutynin (DITROPAN) tablet 5 mg (5 mg Oral Given 08/17/21 0851)  tamsulosin (FLOMAX) capsule 0.4 mg (0.4 mg Oral Given 08/17/21 0829)  albuterol (PROVENTIL) (2.5 MG/3ML) 0.083% nebulizer solution 2.5 mg (2.5 mg Inhalation Given 08/17/21 0830)  mometasone-formoterol (DULERA) 200-5 MCG/ACT inhaler 2 puff (2 puffs Inhalation Given 08/17/21 0936)  enoxaparin (LOVENOX) injection 40 mg (40 mg Subcutaneous Patient Refused/Not Given 08/16/21 2338)  ceFEPIme (MAXIPIME) 2 g in sodium chloride 0.9 % 100 mL IVPB (0 g Intravenous Stopped 08/17/21 0731)  oseltamivir (TAMIFLU) capsule 30 mg (30 mg Oral Given 08/17/21 0828)  dextromethorphan-guaiFENesin ([place] DM) 30-600 MG  per 12 hr tablet 1 tablet (1 tablet Oral Given 08/17/21 0830)  acetaminophen (TYLENOL) tablet 1,000 mg (1,000 mg Oral Given 08/16/21 1803)  lactated ringers bolus 1,000 mL (0 mLs Intravenous Stopped 08/16/21 2018)    And  lactated ringers bolus 500 mL (0 mLs Intravenous Stopped 08/16/21 2021)  ceFEPIme (MAXIPIME) 2 g in sodium chloride 0.9 % 100 mL IVPB (0 g Intravenous Stopped 08/16/21 1926)  potassium chloride SA (KLOR-CON M) CR tablet 40 mEq (40 mEq Oral Given 08/16/21 2052)  vancomycin (VANCOCIN) IVPB 1000 mg/200 mL premix (0 mg Intravenous Stopped 08/16/21 2202)  iohexol (OMNIPAQUE) 300 MG/ML solution 80 mL (80 mLs Intravenous Contrast Given 08/16/21 2029)  magnesium oxide (MAG-OX) tablet 400 mg (400 mg Oral Given 08/16/21 2051)     Tracy Park was admitted to the hospital for ongoing treatment and monitoring.    Final Clinical Impression(s) / ED Diagnoses Final diagnoses:  Sepsis, due to unspecified organism, unspecified whether acute organ dysfunction present [facility])  Influenza A    Rx / DC Orders ED Discharge Orders     None        Regan Lemming, MD 08/17/21 1014

## 2021-08-16 NOTE — Progress Notes (Addendum)
Pharmacy Antibiotic Note  Tracy Park is a 65 y.o. female admitted on 08/16/2021 with  HCAP .  Pharmacy has been consulted for vancomycin dosing.  Plan: Give vancomcyin 1000mg  x1, followed by vancomycin IV 750mg  Q24h (Scr 0.74, eAUC 452) F/u cultures and clinical improvement   Height: 5\' 3"  (160 cm) Weight: 45.4 kg (100 lb) IBW/kg (Calculated) : 52.4  Temp (24hrs), Avg:100.7 F (38.2 C), Min:100.7 F (38.2 C), Max:100.7 F (38.2 C)  No results for input(s): WBC, CREATININE, LATICACIDVEN, VANCOTROUGH, VANCOPEAK, VANCORANDOM, GENTTROUGH, GENTPEAK, GENTRANDOM, TOBRATROUGH, TOBRAPEAK, TOBRARND, AMIKACINPEAK, AMIKACINTROU, AMIKACIN in the last 168 hours.  CrCl cannot be calculated (Patient's most recent lab result is older than the maximum 21 days allowed.).    Allergies  Allergen Reactions   Red Dye Hives and Itching   Oxycontin [Oxycodone Hcl]     Other reaction(s): Hallucinations   Aspirin Hives   Tape Rash    Microbiology results: 12/08 BCx: pending   Thank you for allowing pharmacy to be a part of this patient's care.  Ardyth Harps, PharmD Clinical Pharmacist

## 2021-08-16 NOTE — Progress Notes (Signed)
Pharmacy Antibiotic Note  Tracy Park is a 65 y.o. female admitted on 08/16/2021 with  HCAP .  Pharmacy has been consulted for vancomycin dosing. Now adding Cefepime for gram negative coverage.   Plan: Start Cefepime 2 gm IV Q 12 hours  Continue vancomycin IV 750mg  Q24h (Scr 0.74, eAUC 452) F/u cultures and clinical improvement   Height: 5\' 3"  (160 cm) Weight: 45.4 kg (100 lb) IBW/kg (Calculated) : 52.4  Temp (24hrs), Avg:99.2 F (37.3 C), Min:97.7 F (36.5 C), Max:100.7 F (38.2 C)  Recent Labs  Lab 08/16/21 1836  WBC 7.0  CREATININE 0.74  LATICACIDVEN 0.8     Estimated Creatinine Clearance: 51.6 mL/min (by C-G formula based on SCr of 0.74 mg/dL).    Allergies  Allergen Reactions   Red Dye Hives and Itching   Oxycontin [Oxycodone Hcl]     Other reaction(s): Hallucinations   Aspirin Hives   Tape Rash    ONLY USE PAPER TAPE    Microbiology results: 12/08 BCx: pending   Thank you for allowing pharmacy to be a part of this patient's care.  Albertina Parr, PharmD., BCPS, BCCCP Clinical Pharmacist Please refer to [facility] for unit-specific pharmacist

## 2021-08-16 NOTE — Progress Notes (Signed)
PHARMACY NOTE:  ANTIMICROBIAL RENAL DOSAGE ADJUSTMENT  Current antimicrobial regimen includes a mismatch between antimicrobial dosage and estimated renal function.  As per policy approved by the Pharmacy & Therapeutics and Medical Executive Committees, the antimicrobial dosage will be adjusted accordingly.  Current antimicrobial dosage:  Tamiflu 75mg  po BID  Indication: Flu  Renal Function:  Estimated Creatinine Clearance: 51.6 mL/min (by C-G formula based on SCr of 0.74 mg/dL).    Antimicrobial dosage has been changed to:  Tamiflu 30mg  po BID  Thank you for allowing pharmacy to be a part of this patient's care.  Sherlon Handing, PharmD, BCPS Please see amion for complete clinical pharmacist phone list 08/16/2021 11:03 PM

## 2021-08-16 NOTE — ED Notes (Signed)
Pt O2 bumped up to 4L [place]

## 2021-08-16 NOTE — Progress Notes (Signed)
Notified bedside nurse of need to draw lactic acid and blood cultures and then give antibiotics.

## 2021-08-16 NOTE — ED Notes (Signed)
2nd Lactic not collected as first draw was within normal range.

## 2021-08-16 NOTE — ED Triage Notes (Signed)
Pt arrived to ED via EMS from home w/ c/o shob and fever since Monday. Pt has hx of lung cancer. Per EMS pt was 87% on RA. Pt arrived to ED on 2L [place] w/ O2 sat of 94%. Initial HR 130's. Last HR 116 after 512mL NS bolus. BP 122/80. 100.4 temporal. 18g L FA.

## 2021-08-16 NOTE — ED Notes (Signed)
Patient transported to CT 

## 2021-08-16 NOTE — H&P (Signed)
History and Physical    DRUCELLA KARBOWSKI MVE:[id number] DOB: 01/17/1956 DOA: 08/16/2021  PCP: Irving Shows, PA-C Patient coming from: Home  Chief Complaint: SOB  HPI: Tracy Park is a 65 y.o. female with medical history significant of anxiety, depression, bipolar disorder, COPD, chronic hypoxic respiratory failure on 2 L home oxygen, hypothyroidism, lung cancer status postchemotherapy and surgery, CAD, PTSD, OSA, GERD, hyperlipidemia.  Recently seen in the ED on 12/3 for flulike symptoms and tested positive for influenza A but chest x-ray did not show pneumonia.  She returns to the ED today for evaluation of worsening shortness of breath, cough, fever, and abdominal pain.  On EMS arrival, satting 87% on room air.  Oxygen saturation improved to 94% with 2 L home oxygen.  Febrile with temperature 100.7 F.  Tachycardic with heart rate in the 110s.  Tachypneic with respiratory rate in the mid to high 20s.  Influenza A PCR positive.  COVID-negative.  No leukocytosis.  Sodium 132.  Potassium 3.2.  Lactic acid 0.8.  Blood culture x2 drawn.  Magnesium 1.6.  Lipase and LFTs normal.  Chest x-ray showing bilateral lower lobe infection/inflammation and possible trace left pleural effusion.  CT abdomen pelvis showing bilateral lower lobe multifocal groundglass and airspace opacities worrisome for infection.  Mild right-sided hydroureteronephrosis without obstructing calculus. Patient was given Tylenol, oral magnesium supplement, oral potassium supplement, cefepime, vancomycin, and 30 cc/kg IV fluid.  Patient reports 1 week history of progressively worsening shortness of breath and cough.  She is having fevers at home.  Cough is productive of thick yellow sputum.  She has not received influenza vaccine.  States another family member is sick at present.  She is also endorsing fatigue, low appetite, and suprapubic abdominal pain.  Denies nausea or vomiting.  Denies dysuria.  Does endorse chronic urinary  urgency.  No other complaints.  Review of Systems:  All systems reviewed and apart from history of presenting illness, are negative.  Past Medical History:  Diagnosis Date   Anginal pain ([place])    Anxiety    Bipolar disorder ([place])    Cancer ([place])    COPD (chronic obstructive pulmonary disease) ([place])    Dyspnea    Family history of adverse reaction to anesthesia    History of kidney stones    Hydroureteronephrosis 08/16/2021   Hypothyroidism    Lung cancer ([place])    Myocardial infarction ([place])    PTSD (post-traumatic stress disorder)    Sleep apnea    Thyroid disease     Past Surgical History:  Procedure Laterality Date   ABDOMINAL HYSTERECTOMY     BACK SURGERY     CYSTOSCOPY W/ URETERAL STENT PLACEMENT Right 05/03/2021   Procedure: CYSTOSCOPY WITH RETROGRADE PYELOGRAM/URETERAL STENT PLACEMENT;  Surgeon: Ardis Hughs, MD;  Location: [facility];  Service: Urology;  Laterality: Right;   EYE SURGERY     kidney stent     thyroidectomy       reports that she has been smoking cigarettes. She has never used smokeless tobacco. She reports that she does not currently use alcohol. She reports that she does not currently use drugs.  Allergies  Allergen Reactions   Red Dye Hives and Itching   Oxycontin [Oxycodone Hcl]     Other reaction(s): Hallucinations   Aspirin Hives   Tape Rash    ONLY USE PAPER TAPE    Family History  Family history unknown: Yes    Prior to Admission medications   Medication Sig  Start Date End Date Taking? Authorizing Provider  albuterol (VENTOLIN HFA) 108 (90 Base) MCG/ACT inhaler Inhale 2 puffs into the lungs every 6 (six) hours as needed for wheezing or shortness of breath.  03/23/20   [provider]  Budeson-Glycopyrrol-Formoterol (BREZTRI AEROSPHERE) 160-9-4.8 MCG/ACT AERO Inhale 1 puff into the lungs daily.    [provider]  busPIRone (BUSPAR) 5 MG tablet Take 5 mg by mouth 3 (three) times daily.    [provider]   desvenlafaxine (PRISTIQ) 50 MG 24 hr tablet Take 50 mg by mouth daily.    [provider]  famotidine (PEPCID) 40 MG tablet Take 40 mg by mouth daily as needed for heartburn or indigestion.  02/14/20   [provider]  levothyroxine (SYNTHROID) 137 MCG tablet Take 137 mcg by mouth daily before breakfast. 03/14/20   [provider]  meloxicam (MOBIC) 15 MG tablet Take 15 mg by mouth daily. 04/07/20   [provider]  methocarbamol (ROBAXIN) 500 MG tablet Take 500 mg by mouth every 12 (twelve) hours as needed for muscle spasms.  02/10/20   [provider]  nitroGLYCERIN (NITROSTAT) 0.4 MG SL tablet Place 0.4 mg under the tongue every 5 (five) minutes as needed for chest pain.    [provider]  omeprazole (PRILOSEC) 20 MG capsule Take 40 mg by mouth daily.    [provider]  ondansetron (ZOFRAN) 4 MG tablet Take 4 mg by mouth every 8 (eight) hours as needed for nausea or vomiting.  03/10/20   [provider]  oxybutynin (DITROPAN) 5 MG tablet Take 5 mg by mouth 3 (three) times daily. 03/15/20   [provider]  prazosin (MINIPRESS) 2 MG capsule Take 2 mg by mouth at bedtime. 01/11/20   [provider]  QUEtiapine (SEROQUEL) 300 MG tablet Take 300 mg by mouth at bedtime.    [provider]  rosuvastatin (CRESTOR) 20 MG tablet Take 20 mg by mouth daily. 01/15/20   [provider]  senna (SENOKOT) 8.6 MG TABS tablet Take 1 tablet by mouth 2 (two) times daily as needed for mild constipation.    [provider]  sertraline (ZOLOFT) 100 MG tablet Take 100 mg by mouth in the morning and at bedtime.    [provider]  tamsulosin (FLOMAX) 0.4 MG CAPS capsule Take 0.4 mg by mouth daily. 03/21/20   [provider]  umeclidinium bromide (INCRUSE ELLIPTA) 62.5 MCG/INH AEPB Inhale 1 puff into the lungs daily.    [provider]    Physical Exam: Vitals:   08/16/21 2130 08/16/21  2145 08/16/21 2200 08/16/21 2245  BP: 119/75 110/69 109/76 100/77  Pulse: 81 81 81 80  Resp: (!) 29 (!) 28 (!) 24 17  Temp:      TempSrc:      SpO2: 94% 94% 95% 94%  Weight:      Height:        Physical Exam Constitutional:      General: She is not in acute distress. HENT:     Head: Normocephalic and atraumatic.  Eyes:     Extraocular Movements: Extraocular movements intact.     Conjunctiva/sclera: Conjunctivae normal.  Cardiovascular:     Rate and Rhythm: Normal rate and regular rhythm.     Pulses: Normal pulses.  Pulmonary:     Effort: Pulmonary effort is normal. No respiratory distress.     Breath sounds: No wheezing.     Comments: Slightly coarse breath sounds at  the bases Abdominal:     General: Bowel sounds are normal. There is no distension.     Palpations: Abdomen is soft.     Tenderness: There is no abdominal tenderness. There is no guarding or rebound.  Musculoskeletal:        General: No swelling or tenderness.     Cervical back: Normal range of motion and neck supple.  Skin:    General: Skin is warm and dry.  Neurological:     General: No focal deficit present.     Mental Status: She is alert and oriented to person, place, and time.     Labs on Admission: I have personally reviewed following labs and imaging studies  CBC: Recent Labs  Lab 08/16/21 1836  WBC 7.0  NEUTROABS 5.6  HGB 12.1  HCT 35.8*  MCV 85.9  PLT 188   Basic Metabolic Panel: Recent Labs  Lab 08/16/21 1836  NA 132*  K 3.2*  CL 96*  CO2 26  GLUCOSE 110*  BUN 13  CREATININE 0.74  CALCIUM 8.3*  MG 1.6*   GFR: Estimated Creatinine Clearance: 51.6 mL/min (by C-G formula based on SCr of 0.74 mg/dL). Liver Function Tests: Recent Labs  Lab 08/16/21 1836  AST 20  ALT 21  ALKPHOS 58  BILITOT 0.7  PROT 6.3*  ALBUMIN 2.6*   Recent Labs  Lab 08/16/21 1836  LIPASE 25   No results for input(s): AMMONIA in the last 168 hours. Coagulation Profile: No results for input(s):  INR, PROTIME in the last 168 hours. Cardiac Enzymes: No results for input(s): CKTOTAL, CKMB, CKMBINDEX, TROPONINI in the last 168 hours. BNP (last 3 results) No results for input(s): PROBNP in the last 8760 hours. HbA1C: No results for input(s): HGBA1C in the last 72 hours. CBG: No results for input(s): GLUCAP in the last 168 hours. Lipid Profile: No results for input(s): CHOL, HDL, LDLCALC, TRIG, CHOLHDL, LDLDIRECT in the last 72 hours. Thyroid Function Tests: No results for input(s): TSH, T4TOTAL, FREET4, T3FREE, THYROIDAB in the last 72 hours. Anemia Panel: No results for input(s): VITAMINB12, FOLATE, FERRITIN, TIBC, IRON, RETICCTPCT in the last 72 hours. Urine analysis:    Component Value Date/Time   COLORURINE YELLOW 05/03/2021 0142   APPEARANCEUR CLEAR 05/03/2021 0142   LABSPEC 1.012 05/03/2021 0142   PHURINE 6.0 05/03/2021 0142   GLUCOSEU NEGATIVE 05/03/2021 0142   HGBUR NEGATIVE 05/03/2021 0142   BILIRUBINUR NEGATIVE 05/03/2021 0142   KETONESUR 5 (A) 05/03/2021 0142   PROTEINUR NEGATIVE 05/03/2021 0142   UROBILINOGEN 0.2 11/06/2009 1559   NITRITE NEGATIVE 05/03/2021 0142   LEUKOCYTESUR NEGATIVE 05/03/2021 0142    Radiological Exams on Admission: CT ABDOMEN PELVIS W CONTRAST  Result Date: 08/16/2021 CLINICAL DATA:  Sepsis, acute abdominal pain. History of lung cancer. EXAM: CT ABDOMEN AND PELVIS WITH CONTRAST TECHNIQUE: Multidetector CT imaging of the abdomen and pelvis was performed using the standard protocol following bolus administration of intravenous contrast. CONTRAST:  13mL OMNIPAQUE IOHEXOL 300 MG/ML  SOLN COMPARISON:  CT renal stone 05/03/2021. FINDINGS: Lower chest: There are patchy multifocal ground-glass and airspace opacities within the lung bases, left greater than right. Hepatobiliary: No focal liver abnormality is seen. No gallstones, gallbladder wall thickening, or biliary dilatation. Pancreas: Unremarkable. No pancreatic ductal dilatation or surrounding  inflammatory changes. Spleen: Normal in size without focal abnormality. Adrenals/Urinary Tract: There is mild right-sided hydroureteronephrosis. No definite obstructing calculus identified. Bladder, left kidney and adrenal glands are within normal limits. Stomach/Bowel: Stomach is within normal limits. Appendix  is not seen. No evidence of bowel wall thickening, distention, or inflammatory changes. Vascular/Lymphatic: Aortic atherosclerosis. No enlarged abdominal or pelvic lymph nodes. Reproductive: Status post hysterectomy. No adnexal masses. Other: There is trace free fluid in the pelvis. There is no focal abdominal wall hernia. Musculoskeletal: There is stable grade 1 anterolisthesis at L4-L5, likely degenerative. IMPRESSION: 1. Bilateral lower lung multifocal ground-glass and airspace opacities worrisome for infection. 2. Mild right-sided hydroureteronephrosis without obstructing calculus identified. Findings may be related to recently passed calculus (nonvisualized) or ureteral stricture/mass. Recommend clinical correlation and follow-up. 3. Trace free fluid in the pelvis. 4.  Aortic Atherosclerosis (ICD10-I70.0). Electronically Signed   By: Ronney Asters M.D.   On: 08/16/2021 20:44   DG Chest Port 1 View  Result Date: 08/16/2021 CLINICAL DATA:  Shortness of breath and fever. History of lung cancer EXAM: PORTABLE CHEST 1 VIEW COMPARISON:  Chest x-ray 08/11/2021, CT chest 04/09/2020 FINDINGS: The heart and mediastinal contours are within normal limits. Right to left mediastinal shift. Left lung surgical changes. Biapical pleural/pulmonary scarring. No focal consolidation. Chronic coarsened interstitial markings with overlying patchy airspace opacity along the left lower lobe. Similar finding along the right lower lobe. Persistent blunting of the left costophrenic angle with possible overlying trace left pleural effusion. No right pleural effusion. No acute osseous abnormality. IMPRESSION: Emphysema  ([id number]) with liky superimposed bilateral lower lobe infection/inflammation. Possible trace left pleural effusion. Electronically Signed   By: Iven Finn M.D.   On: 08/16/2021 17:50    EKG: Independently reviewed.  Sinus tachycardia, baseline wander in multiple leads.  Assessment/Plan Principal Problem:   Influenza A with pneumonia Active Problems:   Sepsis ([place])   Hyponatremia   Hypokalemia   Hypomagnesemia   Sepsis secondary to influenza A with pneumonia Patient is here for evaluation of fevers, cough, and shortness of breath.  She recently tested positive for influenza A during ED visit on 12/3 and continues to test positive. Meets SIRS criteria with fever, tachycardia, and tachypnea. No hypotension or lactic acidosis.  No leukocytosis.  Chest x-ray done today showing bilateral lower lobe infection/inflammation and possible trace left pleural effusion.  CT abdomen pelvis showing bilateral lower lobe multifocal groundglass and airspace opacities worrisome for infection.  Pneumonia likely due to influenza A infection but secondary bacterial infection is also possibility.  Patient is currently stable on 2 L home oxygen. -Continue vancomycin and cefepime.  Tylenol as needed for fevers.  Tachycardia resolved after 30 cc/kg fluid boluses in the ED.  Check procalcitonin level.  Start Tamiflu 75 mg twice daily x5 days.  Blood culture x2 pending.  Check strep pneumo and Legionella urinary antigens.  MRSA PCR screen.  Mild hyponatremia -Patient received fluid boluses in the ED.  Repeat BMP in a.m.  Mild hypokalemia and hypomagnesemia -Monitor potassium and magnesium levels, replenish as needed.  Suprapubic abdominal pain CT without acute etiology.  Denies dysuria.  Reports chronic urinary urgency and takes oxybutynin at home. -Urinalysis to rule out UTI  Mild right-sided hydroureteronephrosis During hospital admission in August 2022, patient was found to have right-sided hydronephrosis  with 6 mm obstructing calculus for which she underwent cystoscopy and right ureteral stent placement by urology.  CT abdomen pelvis done today showing mild right-sided hydroureteronephrosis without obstructing calculus. ?Urethral stricture/ mass.  -Continue Flomax.  Consider discussing with urology in a.m.  Anxiety, depression, bipolar disorder -Continue home meds  COPD Stable. -Continue home inhalers  Chronic hypoxic respiratory failure Satting well on 2 L home oxygen. -Continue supplemental  oxygen  Hypothyroidism -Continue Synthroid  GERD -Continue PPI and H2 blocker  Hyperlipidemia -Continue statin  DVT prophylaxis: Lovenox Code Status: Patient wishes to be DNR. Family Communication: No family available at this time. Disposition Plan: Status is: Inpatient  Remains inpatient appropriate because: Sepsis secondary to influenza A with pneumonia  Level of care: Level of care: Telemetry Medical  The medical decision making on this patient was of high complexity and the patient is at high risk for clinical deterioration, therefore this is a level 3 visit.  Shela Leff MD [facility]  If 7PM-7AM, please contact night-coverage [email or web address]  08/16/2021, 10:59 PM

## 2021-08-17 LAB — BASIC METABOLIC PANEL
Anion gap: 7 (ref 5–15)
BUN: 12 mg/dL (ref 8–23)
CO2: 26 mmol/L (ref 22–32)
Calcium: 8.4 mg/dL — ABNORMAL LOW (ref 8.9–10.3)
Chloride: 104 mmol/L (ref 98–111)
Creatinine, Ser: 0.51 mg/dL (ref 0.44–1.00)
GFR, Estimated: >60 mL/min (ref >60)
Glucose, Bld: 103 mg/dL — ABNORMAL HIGH (ref 70–99)
Potassium: 4 mmol/L (ref 3.5–5.1)
Sodium: 137 mmol/L (ref 135–145)

## 2021-08-17 LAB — URINALYSIS, ROUTINE W REFLEX MICROSCOPIC
Bilirubin Urine: NEGATIVE
Glucose, UA: NEGATIVE mg/dL
Hgb urine dipstick: NEGATIVE
Ketones, ur: NEGATIVE mg/dL
Leukocytes,Ua: NEGATIVE
Nitrite: NEGATIVE
Protein, ur: NEGATIVE mg/dL
Specific Gravity, Urine: <1.005 — ABNORMAL LOW (ref 1.005–1.030)
pH: 7 (ref 5.0–8.0)

## 2021-08-17 LAB — HIV ANTIBODY (ROUTINE TESTING W REFLEX): HIV Screen 4th Generation wRfx: NONREACTIVE

## 2021-08-17 LAB — LACTIC ACID, PLASMA: Lactic Acid, Venous: 1 mmol/L (ref 0.5–1.9)

## 2021-08-17 LAB — PROCALCITONIN: Procalcitonin: 0.68 ng/mL

## 2021-08-17 LAB — MAGNESIUM: Magnesium: 1.7 mg/dL (ref 1.7–2.4)

## 2021-08-17 LAB — MRSA NEXT GEN BY PCR, NASAL: MRSA by PCR Next Gen: NOT DETECTED

## 2021-08-17 MED ORDER — MAGNESIUM SULFATE 2 GM/50ML IV SOLN
2.0000 g | Freq: Once | INTRAVENOUS | Status: AC
  Administered 2021-08-17: 2 g via INTRAVENOUS
  Filled 2021-08-17: qty 50

## 2021-08-17 MED ORDER — METHYLPREDNISOLONE SODIUM SUCC 40 MG IJ SOLR
40.0000 mg | Freq: Two times a day (BID) | INTRAMUSCULAR | Status: DC
  Administered 2021-08-17 – 2021-08-19 (×4): 40 mg via INTRAVENOUS
  Filled 2021-08-17 (×4): qty 1

## 2021-08-17 NOTE — ED Notes (Signed)
Provider at bedside

## 2021-08-17 NOTE — Progress Notes (Signed)
PROGRESS NOTE    Tracy Park  GUR:[id number] DOB: [birth year] DOA: 08/16/2021 PCP: Irving Shows, PA-C    Chief Complaint  Patient presents with   Code Sepsis    Brief Narrative:  H/o OSA, COPD, chronic hypoxic respiratory failure on 2 L home oxygen,  lung cancer status postchemotherapy and surgery two years ago who was Recently seen in the ED on 12/3 for flulike symptoms and tested positive for influenza A but chest x-ray did not show pneumonia.  She returns to the ED today for evaluation of worsening shortness of breath, cough, fever, and abdominal pain.  On EMS arrival, satting 87% on room air.  Oxygen saturation improved to 94% with 2 L home oxygen.  Febrile with temperature 100.7 F.  Tachycardic with heart rate in the 110s.  Tachypneic with respiratory rate in the mid to high 20s.  Influenza A PCR positive.  COVID-negative.   Subjective:  Continued cough, wheezing  Assessment & Plan:   Principal Problem:   Influenza A with pneumonia Active Problems:   Sepsis ([place])   Hyponatremia   Hypokalemia   Hypomagnesemia  Sepsis from influenza A with possible pneumonia -Represented with fever, tachycardia, tachypnea -Crease started on Tamiflu and empiric antibiotics -Blood culture no growth to date, MRSA screening negative  COPD exacerbation, chronic hypoxic respiratory failure on home O2 Bilateral wheezing on exam Continue antibiotics, start IV steroid, continue nebulizer, Mucinex  Mild hyponatremia on presentation Sodium normalized after hydration  Hypokalemia/hypomagnesemia Replaced , mag remain low, gave IV mag, recheck in the morning     Suprapubic abdominal pain CT without acute etiology.  Denies dysuria.  Reports chronic urinary urgency and takes oxybutynin at home. -ua no sign of infection   Mild right-sided hydroureteronephrosis During hospital admission in August 2022, patient was found to have right-sided hydronephrosis with 6 mm obstructing calculus for  which she underwent cystoscopy and right ureteral stent placement by urology.  CT abdomen pelvis done on admission showing mild right-sided hydroureteronephrosis without obstructing calculus. ?Urethral stricture/ mass.  -Continue Flomax.  Consider discussing with urology in a.m. vs outpatient follow up with urology as ua is unremarkable, cr wnl and she does not appear to have symptom       GERD -Continue PPI and H2 blocker   Hyperlipidemia -Continue statin  Hypothyroidism, h/o thyroidectomy  -Continue Synthroid    Anxiety, depression, bipolar disorder -Continue home meds   Nutritional Assessment:  The patient's BMI is: Body mass index is 17.71 kg/m.[name]  Seen by dietician.  I agree with the assessment and plan as outlined below:  Nutrition Status:            Unresulted Labs (From admission, onward)     Start     Ordered   08/18/21 0500  CBC with Differential/Platelet  Tomorrow morning,   R        08/17/21 1757   08/18/21 2831  Basic metabolic panel  Tomorrow morning,   R        08/17/21 1757   08/18/21 0500  Magnesium  Tomorrow morning,   R        08/17/21 1757   08/18/21 0500  Procalcitonin  Daily,   R      08/17/21 1758   08/17/21 1745  Strep pneumoniae urinary antigen  Once,   R        08/17/21 1744   08/17/21 1745  Expectorated Sputum Assessment w Gram Stain, Rflx to Resp Cult  Once,   R  08/17/21 1744   08/16/21 2353  Legionella Pneumophila Serogp 1 Ur Ag  Once,   R        08/16/21 2353   08/16/21 2252  Strep pneumoniae urinary antigen  Once,   R        08/16/21 2253              DVT prophylaxis: enoxaparin (LOVENOX) injection 40 mg Start: 08/16/21 2315   Code Status:DNR Family Communication: Patient Disposition:   Status is: Inpatient  Dispo: The patient is from: Home              Anticipated d/c is to: Home              Anticipated d/c date is: TBD                Consultants:  None  Procedures:  None  Antimicrobials:    Anti-infectives (From admission, onward)    Start     Dose/Rate Route Frequency Ordered Stop   08/17/21 2000  vancomycin (VANCOREADY) IVPB 750 mg/150 mL  Status:  Discontinued        750 mg 150 mL/hr over 60 Minutes Intravenous Every 24 hours 08/16/21 2023 08/17/21 0857   08/17/21 0600  ceFEPIme (MAXIPIME) 2 g in sodium chloride 0.9 % 100 mL IVPB        2 g 200 mL/hr over 30 Minutes Intravenous Every 12 hours 08/16/21 2301     08/16/21 2315  oseltamivir (TAMIFLU) capsule 30 mg        30 mg Oral 2 times daily 08/16/21 2302 08/21/21 2159   08/16/21 2300  oseltamivir (TAMIFLU) capsule 75 mg  Status:  Discontinued        75 mg Oral 2 times daily 08/16/21 2253 08/16/21 2302   08/16/21 2015  vancomycin (VANCOCIN) IVPB 1000 mg/200 mL premix        1,000 mg 200 mL/hr over 60 Minutes Intravenous  Once 08/16/21 2013 08/16/21 2202   08/16/21 1745  cefTRIAXone (ROCEPHIN) 2 g in sodium chloride 0.9 % 100 mL IVPB  Status:  Discontinued        2 g 200 mL/hr over 30 Minutes Intravenous Every 24 hours 08/16/21 1730 08/16/21 1734   08/16/21 1745  azithromycin (ZITHROMAX) 500 mg in sodium chloride 0.9 % 250 mL IVPB  Status:  Discontinued        500 mg 250 mL/hr over 60 Minutes Intravenous Every 24 hours 08/16/21 1730 08/16/21 1734   08/16/21 1745  ceFEPIme (MAXIPIME) 1 g in sodium chloride 0.9 % 100 mL IVPB  Status:  Discontinued        1 g 200 mL/hr over 30 Minutes Intravenous  Once 08/16/21 1734 08/16/21 1742   08/16/21 1745  ceFEPIme (MAXIPIME) 2 g in sodium chloride 0.9 % 100 mL IVPB        2 g 200 mL/hr over 30 Minutes Intravenous  Once 08/16/21 1742 08/16/21 1926           Objective: Vitals:   08/17/21 1300 08/17/21 1500 08/17/21 1548 08/17/21 1613  BP: 114/68 118/72 109/90 140/69  Pulse: 97 100 100 100  Resp: (!) 28 (!) 26 20 19   Temp: 97.9 F (36.6 C) 97.9 F (36.6 C) 98 F (36.7 C) 98.6 F (37 C)  TempSrc: Oral Oral Oral Oral  SpO2: 98% 93% 100% 91%  Weight:      Height:         Intake/Output Summary (Last 24 hours) at  08/17/2021 1759 Last data filed at 08/17/2021 1617 Gross per 24 hour  Intake 2435.71 ml  Output --  Net 2435.71 ml   Filed Weights   08/16/21 1737  Weight: 45.4 kg    Examination:  General exam: Thin, appear malnourished, in moderate distress due to coughing or wheezing Respiratory system: Diffuse bilateral wheezing , tachypnea, no accessory muscle use. Cardiovascular system: Sinus tachycardia Gastrointestinal system: Abdomen is nondistended, soft and nontender.  Normal bowel sounds heard. Central nervous system: Alert and oriented. No focal neurological deficits. Extremities:  no edema Skin: No rashes, lesions or ulcers Psychiatry: Judgement and insight appear normal. Mood & affect appropriate.     Data Reviewed: I have personally reviewed following labs and imaging studies  CBC: Recent Labs  Lab 08/16/21 1836  WBC 7.0  NEUTROABS 5.6  HGB 12.1  HCT 35.8*  MCV 85.9  PLT 010    Basic Metabolic Panel: Recent Labs  Lab 08/16/21 1836 08/17/21 0216  NA 132* 137  K 3.2* 4.0  CL 96* 104  CO2 26 26  GLUCOSE 110* 103*  BUN 13 12  CREATININE 0.74 0.51  CALCIUM 8.3* 8.4*  MG 1.6* 1.7    GFR: Estimated Creatinine Clearance: 51.6 mL/min (by C-G formula based on SCr of 0.51 mg/dL).  Liver Function Tests: Recent Labs  Lab 08/16/21 1836  AST 20  ALT 21  ALKPHOS 58  BILITOT 0.7  PROT 6.3*  ALBUMIN 2.6*    CBG: No results for input(s): GLUCAP in the last 168 hours.   Recent Results (from the past 240 hour(s))  Resp Panel by RT-PCR (Flu A&B, Covid) Nasopharyngeal Swab     Status: Abnormal   Collection Time: 08/11/21  8:36 AM   Specimen: Nasopharyngeal Swab; Nasopharyngeal(NP) swabs in vial transport medium  Result Value Ref Range Status   SARS Coronavirus 2 by RT PCR NEGATIVE NEGATIVE Final    Comment: (NOTE) SARS-CoV-2 target nucleic acids are NOT DETECTED.  The SARS-CoV-2 RNA is generally detectable in  upper respiratory specimens during the acute phase of infection. The lowest concentration of SARS-CoV-2 viral copies this assay can detect is 138 copies/mL. A negative result does not preclude SARS-Cov-2 infection and should not be used as the sole basis for treatment or other patient management decisions. A negative result may occur with  improper specimen collection/handling, submission of specimen other than nasopharyngeal swab, presence of viral mutation(s) within the areas targeted by this assay, and inadequate number of viral copies(<138 copies/mL). A negative result must be combined with clinical observations, patient history, and epidemiological information. The expected result is Negative.  Fact Sheet for Patients:  EntrepreneurPulse.com.au  Fact Sheet for Healthcare Providers:  IncredibleEmployment.be  This test is no t yet approved or cleared by the [place] FDA and  has been authorized for detection and/or diagnosis of SARS-CoV-2 by FDA under an Emergency Use Authorization (EUA). This EUA will remain  in effect (meaning this test can be used) for the duration of the COVID-19 declaration under Section 564(b)(1) of the Act, 21 U.S.C.section 360bbb-3(b)(1), unless the authorization is terminated  or revoked sooner.       Influenza A by PCR POSITIVE (A) NEGATIVE Final   Influenza B by PCR NEGATIVE NEGATIVE Final    Comment: (NOTE) The Xpert Xpress SARS-CoV-2/FLU/RSV plus assay is intended as an aid in the diagnosis of influenza from Nasopharyngeal swab specimens and should not be used as a sole basis for treatment. Nasal washings and aspirates are unacceptable for Xpert Xpress  SARS-CoV-2/FLU/RSV testing.  Fact Sheet for Patients: EntrepreneurPulse.com.au  Fact Sheet for Healthcare Providers: IncredibleEmployment.be  This test is not yet approved or cleared by the [place] FDA and has  been authorized for detection and/or diagnosis of SARS-CoV-2 by FDA under an Emergency Use Authorization (EUA). This EUA will remain in effect (meaning this test can be used) for the duration of the COVID-19 declaration under Section 564(b)(1) of the Act, 21 U.S.C. section 360bbb-3(b)(1), unless the authorization is terminated or revoked.  Performed at [facility], [place] [address] [place], [place] [address]   Blood culture (routine x 2)     Status: None (Preliminary result)   Collection Time: 08/16/21  5:37 PM   Specimen: BLOOD  Result Value Ref Range Status   Specimen Description BLOOD SITE NOT SPECIFIED  Final   Special Requests   Final    BOTTLES DRAWN AEROBIC AND ANAEROBIC Blood Culture adequate volume   Culture   Final    NO GROWTH < 24 HOURS Performed at [facility], [place] [address] [place], [place] [address]    Report Status PENDING  Incomplete  Resp Panel by RT-PCR (Flu A&B, Covid) Nasopharyngeal Swab     Status: Abnormal   Collection Time: 08/16/21  6:31 PM   Specimen: Nasopharyngeal Swab; Nasopharyngeal(NP) swabs in vial transport medium  Result Value Ref Range Status   SARS Coronavirus 2 by RT PCR NEGATIVE NEGATIVE Final    Comment: (NOTE) SARS-CoV-2 target nucleic acids are NOT DETECTED.  The SARS-CoV-2 RNA is generally detectable in upper respiratory specimens during the acute phase of infection. The lowest concentration of SARS-CoV-2 viral copies this assay can detect is 138 copies/mL. A negative result does not preclude SARS-Cov-2 infection and should not be used as the sole basis for treatment or other patient management decisions. A negative result may occur with  improper specimen collection/handling, submission of specimen other than nasopharyngeal swab, presence of viral mutation(s) within the areas targeted by this assay, and inadequate number of viral copies(<138 copies/mL). A negative result must be combined with clinical  observations, patient history, and epidemiological information. The expected result is Negative.  Fact Sheet for Patients:  EntrepreneurPulse.com.au  Fact Sheet for Healthcare Providers:  IncredibleEmployment.be  This test is no t yet approved or cleared by the [place] FDA and  has been authorized for detection and/or diagnosis of SARS-CoV-2 by FDA under an Emergency Use Authorization (EUA). This EUA will remain  in effect (meaning this test can be used) for the duration of the COVID-19 declaration under Section 564(b)(1) of the Act, 21 U.S.C.section 360bbb-3(b)(1), unless the authorization is terminated  or revoked sooner.       Influenza A by PCR POSITIVE (A) NEGATIVE Final   Influenza B by PCR NEGATIVE NEGATIVE Final    Comment: (NOTE) The Xpert Xpress SARS-CoV-2/FLU/RSV plus assay is intended as an aid in the diagnosis of influenza from Nasopharyngeal swab specimens and should not be used as a sole basis for treatment. Nasal washings and aspirates are unacceptable for Xpert Xpress SARS-CoV-2/FLU/RSV testing.  Fact Sheet for Patients: EntrepreneurPulse.com.au  Fact Sheet for Healthcare Providers: IncredibleEmployment.be  This test is not yet approved or cleared by the [place] FDA and has been authorized for detection and/or diagnosis of SARS-CoV-2 by FDA under an Emergency Use Authorization (EUA). This EUA will remain in effect (meaning this test can be used) for the duration of the COVID-19 declaration under Section 564(b)(1) of the Act, 21 U.S.C. section 360bbb-3(b)(1),  unless the authorization is terminated or revoked.  Performed at [facility], [place] [address] [place], [place] [address]   MRSA Next Gen by PCR, Nasal     Status: None   Collection Time: 08/16/21 10:52 PM   Specimen: Nasal Mucosa; Nasal Swab  Result Value Ref Range Status   MRSA by PCR Next Gen NOT DETECTED  NOT DETECTED Final    Comment: (NOTE) The GeneXpert MRSA Assay (FDA approved for NASAL specimens only), is one component of a comprehensive MRSA colonization surveillance program. It is not intended to diagnose MRSA infection nor to guide or monitor treatment for MRSA infections. Test performance is not FDA approved in patients less than 2 years old. Performed at [facility], [place] [address], [place], [place] [address]   Blood culture (routine x 2)     Status: None (Preliminary result)   Collection Time: 08/17/21  2:16 AM   Specimen: BLOOD RIGHT HAND  Result Value Ref Range Status   Specimen Description BLOOD RIGHT HAND  Final   Special Requests   Final    BOTTLES DRAWN AEROBIC AND ANAEROBIC Blood Culture adequate volume   Culture   Final    NO GROWTH < 12 HOURS Performed at [facility], [place] [address] [place], [place] [address]    Report Status PENDING  Incomplete         Radiology Studies: CT ABDOMEN PELVIS W CONTRAST  Result Date: 08/16/2021 CLINICAL DATA:  Sepsis, acute abdominal pain. History of lung cancer. EXAM: CT ABDOMEN AND PELVIS WITH CONTRAST TECHNIQUE: Multidetector CT imaging of the abdomen and pelvis was performed using the standard protocol following bolus administration of intravenous contrast. CONTRAST:  22mL OMNIPAQUE IOHEXOL 300 MG/ML  SOLN COMPARISON:  CT renal stone 05/03/2021. FINDINGS: Lower chest: There are patchy multifocal ground-glass and airspace opacities within the lung bases, left greater than right. Hepatobiliary: No focal liver abnormality is seen. No gallstones, gallbladder wall thickening, or biliary dilatation. Pancreas: Unremarkable. No pancreatic ductal dilatation or surrounding inflammatory changes. Spleen: Normal in size without focal abnormality. Adrenals/Urinary Tract: There is mild right-sided hydroureteronephrosis. No definite obstructing calculus identified. Bladder, left kidney and adrenal glands are within normal limits.  Stomach/Bowel: Stomach is within normal limits. Appendix is not seen. No evidence of bowel wall thickening, distention, or inflammatory changes. Vascular/Lymphatic: Aortic atherosclerosis. No enlarged abdominal or pelvic lymph nodes. Reproductive: Status post hysterectomy. No adnexal masses. Other: There is trace free fluid in the pelvis. There is no focal abdominal wall hernia. Musculoskeletal: There is stable grade 1 anterolisthesis at L4-L5, likely degenerative. IMPRESSION: 1. Bilateral lower lung multifocal ground-glass and airspace opacities worrisome for infection. 2. Mild right-sided hydroureteronephrosis without obstructing calculus identified. Findings may be related to recently passed calculus (nonvisualized) or ureteral stricture/mass. Recommend clinical correlation and follow-up. 3. Trace free fluid in the pelvis. 4.  Aortic Atherosclerosis (ICD10-I70.0). Electronically Signed   By: Ronney Asters M.D.   On: 08/16/2021 20:44   DG Chest Port 1 View  Result Date: 08/16/2021 CLINICAL DATA:  Shortness of breath and fever. History of lung cancer EXAM: PORTABLE CHEST 1 VIEW COMPARISON:  Chest x-ray 08/11/2021, CT chest 04/09/2020 FINDINGS: The heart and mediastinal contours are within normal limits. Right to left mediastinal shift. Left lung surgical changes. Biapical pleural/pulmonary scarring. No focal consolidation. Chronic coarsened interstitial markings with overlying patchy airspace opacity along the left lower lobe. Similar finding along the right lower lobe. Persistent blunting of the left costophrenic angle with possible  overlying trace left pleural effusion. No right pleural effusion. No acute osseous abnormality. IMPRESSION: Emphysema ([id number]) with liky superimposed bilateral lower lobe infection/inflammation. Possible trace left pleural effusion. Electronically Signed   By: Iven Finn M.D.   On: 08/16/2021 17:50        Scheduled Meds:  busPIRone  5 mg Oral TID    dextromethorphan-guaiFENesin  1 tablet Oral BID   enoxaparin (LOVENOX) injection  40 mg Subcutaneous QHS   famotidine  40 mg Oral Daily   levothyroxine  137 mcg Oral QAC breakfast   methylPREDNISolone (SOLU-MEDROL) injection  40 mg Intravenous Q12H   mometasone-formoterol  2 puff Inhalation BID   oseltamivir  30 mg Oral BID   oxybutynin  5 mg Oral TID   pantoprazole  40 mg Oral Daily   QUEtiapine  300 mg Oral QHS   rosuvastatin  20 mg Oral Daily   sertraline  100 mg Oral BID   tamsulosin  0.4 mg Oral Daily   venlafaxine XR  75 mg Oral Q breakfast   Continuous Infusions:  ceFEPime (MAXIPIME) IV 2 g (08/17/21 1617)   magnesium sulfate bolus IVPB       LOS: 1 day   Time spent: 45mins Greater than 50% of this time was spent in counseling, explanation of diagnosis, planning of further management, and coordination of care.   Voice Recognition Viviann Spare dictation system was used to create this note, attempts have been made to correct errors. Please contact the author with questions and/or clarifications.   Florencia Reasons, MD PhD FACP [facility]  Available via Epic secure chat 7am-7pm for nonurgent issues Please page for urgent issues To page the attending provider between 7A-7P or the covering provider during after hours 7P-7A, please log into the web site [email or web address] and access using universal [place] password for that web site. If you do not have the password, please call the hospital operator.    08/17/2021, 5:59 PM

## 2021-08-18 LAB — BASIC METABOLIC PANEL
Anion gap: 9 (ref 5–15)
BUN: 9 mg/dL (ref 8–23)
CO2: 26 mmol/L (ref 22–32)
Calcium: 8.2 mg/dL — ABNORMAL LOW (ref 8.9–10.3)
Chloride: 102 mmol/L (ref 98–111)
Creatinine, Ser: 0.58 mg/dL (ref 0.44–1.00)
GFR, Estimated: >60 mL/min (ref >60)
Glucose, Bld: 156 mg/dL — ABNORMAL HIGH (ref 70–99)
Potassium: 3.7 mmol/L (ref 3.5–5.1)
Sodium: 137 mmol/L (ref 135–145)

## 2021-08-18 LAB — CBC WITH DIFFERENTIAL/PLATELET
Abs Immature Granulocytes: 0 10*3/uL (ref 0.00–0.07)
Band Neutrophils: 20 %
Basophils Absolute: 0 10*3/uL (ref 0.0–0.1)
Basophils Relative: 0 %
Eosinophils Absolute: 0 10*3/uL (ref 0.0–0.5)
Eosinophils Relative: 0 %
HCT: 32.1 % — ABNORMAL LOW (ref 36.0–46.0)
Hemoglobin: 10.7 g/dL — ABNORMAL LOW (ref 12.0–15.0)
Lymphocytes Relative: 3 %
Lymphs Abs: 0.3 10*3/uL — ABNORMAL LOW (ref 0.7–4.0)
MCH: 28.9 pg (ref 26.0–34.0)
MCHC: 33.3 g/dL (ref 30.0–36.0)
MCV: 86.8 fL (ref 80.0–100.0)
Monocytes Absolute: 0.1 10*3/uL (ref 0.1–1.0)
Monocytes Relative: 1 %
Neutro Abs: 10.1 10*3/uL — ABNORMAL HIGH (ref 1.7–7.7)
Neutrophils Relative %: 76 %
Platelets: 296 10*3/uL (ref 150–400)
RBC: 3.7 MIL/uL — ABNORMAL LOW (ref 3.87–5.11)
RDW: 14.7 % (ref 11.5–15.5)
WBC Morphology: INCREASED
WBC: 10.5 10*3/uL (ref 4.0–10.5)
nRBC: 0 % (ref 0.0–0.2)
nRBC: 0 /100 WBC

## 2021-08-18 LAB — MAGNESIUM: Magnesium: 2.1 mg/dL (ref 1.7–2.4)

## 2021-08-18 LAB — PROCALCITONIN: Procalcitonin: 0.64 ng/mL

## 2021-08-18 MED ORDER — DOCUSATE SODIUM 100 MG PO CAPS: 100.0000 mg | ORAL_CAPSULE | Freq: Once | ORAL | Status: DC

## 2021-08-18 MED ORDER — ADULT MULTIVITAMIN W/MINERALS CH
1.0000 | ORAL_TABLET | Freq: Every day | ORAL | Status: DC
  Administered 2021-08-18 – 2021-08-25 (×8): 1 via ORAL
  Filled 2021-08-18 (×8): qty 1

## 2021-08-18 MED ORDER — KETOROLAC TROMETHAMINE 15 MG/ML IJ SOLN
15.0000 mg | Freq: Once | INTRAMUSCULAR | Status: AC
  Administered 2021-08-19: 15 mg via INTRAVENOUS
  Filled 2021-08-18: qty 1

## 2021-08-18 MED ORDER — POLYETHYLENE GLYCOL 3350 17 G PO PACK
17.0000 g | PACK | Freq: Every day | ORAL | Status: DC
  Administered 2021-08-18 – 2021-08-19 (×2): 17 g via ORAL
  Filled 2021-08-18 (×2): qty 1

## 2021-08-18 MED ORDER — GUAIFENESIN ER 600 MG PO TB12
600.0000 mg | ORAL_TABLET | Freq: Two times a day (BID) | ORAL | Status: DC
  Administered 2021-08-18 – 2021-08-25 (×14): 600 mg via ORAL
  Filled 2021-08-18 (×14): qty 1

## 2021-08-18 MED ORDER — BENZONATATE 100 MG PO CAPS
100.0000 mg | ORAL_CAPSULE | Freq: Three times a day (TID) | ORAL | Status: DC | PRN
  Administered 2021-08-20 – 2021-08-25 (×6): 100 mg via ORAL
  Filled 2021-08-18 (×6): qty 1

## 2021-08-18 MED ORDER — ENSURE ENLIVE PO LIQD
237.0000 mL | Freq: Two times a day (BID) | ORAL | Status: DC
  Administered 2021-08-18 – 2021-08-20 (×3): 237 mL via ORAL

## 2021-08-18 NOTE — Progress Notes (Signed)
  Transition of Care (TOC) Screening Note   Patient Details  Name: Tracy Park Date of Birth: 1956-05-23   Transition of Care [facility]) CM/SW Contact:    Bartholomew Crews, RN Phone Number: [phone] 08/18/2021, 9:50 AM    Transition of Care Department [facility]) has reviewed patient and no TOC needs have been identified at this time. We will continue to monitor patient advancement through interdisciplinary progression rounds. If new patient transition needs arise, please place a TOC consult.

## 2021-08-18 NOTE — Plan of Care (Signed)
  Problem: Education: Goal: Knowledge of General Education information will improve Description: Including pain rating scale, medication(s)/side effects and non-pharmacologic comfort measures Outcome: Progressing   Problem: Activity: Goal: Risk for activity intolerance will decrease Outcome: Progressing   

## 2021-08-18 NOTE — Progress Notes (Signed)
Initial Nutrition Assessment  DOCUMENTATION CODES:   Underweight, suspect malnutrition  INTERVENTION:   - Recommend liberalizing diet to Regular, reached out to MD and awaiting response  - Ensure Enlive po BID, each supplement provides 350 kcal and 20 grams of protein  - MVI with minerals daily  NUTRITION DIAGNOSIS:   Increased nutrient needs related to acute illness (sepsis secondary to influenza A pneumonia) as evidenced by estimated needs.  GOAL:   Patient will meet greater than or equal to 90% of their needs  MONITOR:   PO intake, Supplement acceptance, Weight trends  REASON FOR ASSESSMENT:   Consult Assessment of nutrition requirement/status  ASSESSMENT:   65 year old female who presented to the ED on 12/08 as a code sepsis. PMH of bipolar disorder, COPD on 2 L oxygen, hypothyroidism, lung cancer s/p resection, anxiety, depression, CAD, PTSD, OSA, GERD, HLD. Pt admitted with sepsis secondary to influenza A pneumonia.  Pt currently on a Heart Healthy diet with no meal completions recorded.  Unable to reach pt via phone call to room. Reviewed RD note from 05/04/21. At this time, pt met criteria for moderate malnutrition. Pt was reporting decreased appetite due to stress. Pt's daughter reported at that time that pt had a history of anorexia nervosa.  Reviewed available weight history in chart. Noted pt's weight has steadily been increasing from 40.8 kg on 04/09/20 to 43.5 kg on 05/03/21 to 45.4 kg yesterday. However, weight on admission appears to be stated rather than measured. Recommend obtaining measured admission weight.  Recommend liberalizing diet to Regular to promote PO intake. Reached out to MD via secure chat; awaiting response. RD to order oral nutrition supplements to aid pt in meeting kcal and protein needs. Will also order daily MVI with minerals.  Medications reviewed and include: pepcid, IV solu-medrol, tamiflu, protonix, IV abx  Labs reviewed.  NUTRITION  - FOCUSED PHYSICAL EXAM:  Unable to complete at this time. RD working remotely.   Diet Order:   Diet Order             Diet regular Room service appropriate? Yes; Fluid consistency: Thin  Diet effective now                   EDUCATION NEEDS:   Not appropriate for education at this time  Skin:  Skin Assessment: Reviewed RN Assessment  Last BM:  no documented BM  Height:   Ht Readings from Last 1 Encounters:  08/16/21 _0  (1.6 m)    Weight:   Wt Readings from Last 1 Encounters:  08/16/21 45.4 kg    BMI:  Body mass index is 17.71 kg/m.  Estimated Nutritional Needs:   Kcal:  1500-1700  Protein:  70-80 grams  Fluid:  1.5-1.7 L    Gustavus Bryant, MS, RD, LDN Inpatient Clinical Dietitian Please see AMiON for contact information.

## 2021-08-18 NOTE — Progress Notes (Signed)
PROGRESS NOTE    Tracy MOULTRY  Park:[id number] DOB: 1956/05/30 DOA: 08/16/2021 PCP: Irving Shows, PA-C    Chief Complaint  Patient presents with   Code Sepsis    Brief Narrative:  H/o OSA, COPD, chronic hypoxic respiratory failure on 2 L home oxygen,  lung cancer status postchemotherapy and surgery two years ago who was Recently seen in the ED on 12/3 for flulike symptoms and tested positive for influenza A but chest x-ray did not show pneumonia.  She returns to the ED today for evaluation of worsening shortness of breath, cough, fever, and abdominal pain.  On EMS arrival, satting 87% on room air.  Oxygen saturation improved to 94% with 2 L home oxygen.  Febrile with temperature 100.7 F.  Tachycardic with heart rate in the 110s.  Tachypneic with respiratory rate in the mid to high 20s.  Influenza A PCR positive.  COVID-negative.   Subjective:  Continued cough, less wheezing No fever  Assessment & Plan:   Principal Problem:   Influenza A with pneumonia Active Problems:   Sepsis ([place])   Hyponatremia   Hypokalemia   Hypomagnesemia  Sepsis from influenza A with possible pneumonia -Represented with fever, tachycardia, tachypnea -started on Tamiflu and empiric antibiotics -Blood culture no growth to date, MRSA screening negative  COPD exacerbation, chronic hypoxic respiratory failure on home O2 Bilateral wheezing on exam on 12/9 Continue antibiotics, started IV steroid, continue nebulizer, Mucinex Less wheezing, ( she said she just received nebs), likely able to wean steroids tomorrow  Elevated fasting blood glucose likely due to steroid, will check A1c  Mild hyponatremia on presentation Sodium normalized after hydration  Hypokalemia/hypomagnesemia Replaced , improved     Suprapubic abdominal pain CT without acute etiology.  Denies dysuria.  Reports chronic urinary urgency and takes oxybutynin at home. -ua no sign of infection   Mild right-sided  hydroureteronephrosis During hospital admission in August 2022, patient was found to have right-sided hydronephrosis with 6 mm obstructing calculus for which she underwent cystoscopy and right ureteral stent placement by urology.  CT abdomen pelvis done on admission showing mild right-sided hydroureteronephrosis without obstructing calculus. ?Urethral stricture/ mass.  -Continue Flomax.  Consider discussing with urology in a.m. vs outpatient follow up with urology as ua is unremarkable, cr wnl and she does not appear to have symptom       GERD -Continue PPI and H2 blocker   Hyperlipidemia -Continue statin  Hypothyroidism, h/o thyroidectomy  -Continue Synthroid    Anxiety, depression, bipolar disorder -Continue home meds   Nutritional Assessment:  The patient's BMI is: Body mass index is 17.71 kg/m.[name]  Seen by dietician.  I agree with the assessment and plan as outlined below:  Nutrition Status: Nutrition Problem: Increased nutrient needs Etiology: acute illness (sepsis secondary to influenza A pneumonia) Signs/Symptoms: estimated needs Interventions: Ensure Enlive (each supplement provides 350kcal and 20 grams of protein), MVI, Liberalize Diet      Unresulted Labs (From admission, onward)     Start     Ordered   08/19/21 3149  Basic metabolic panel  Daily,   R      08/18/21 1642   08/19/21 0500  Magnesium  Tomorrow morning,   STAT        08/18/21 1642   08/18/21 0500  Procalcitonin  Daily,   R      08/17/21 1758   08/17/21 1745  Strep pneumoniae urinary antigen  Once,   R        08/17/21  1744   08/17/21 1745  Expectorated Sputum Assessment w Gram Stain, Rflx to Resp Cult  Once,   R        08/17/21 1744   08/16/21 2353  Legionella Pneumophila Serogp 1 Ur Ag  Once,   R        08/16/21 2353              DVT prophylaxis: enoxaparin (LOVENOX) injection 40 mg Start: 08/16/21 2315   Code Status:DNR Family Communication: Patient Disposition:   Status is:  Inpatient  Dispo: The patient is from: Home              Anticipated d/c is to: Home              Anticipated d/c date is: 24-48hrs                 Consultants:  None  Procedures:  None  Antimicrobials:   Anti-infectives (From admission, onward)    Start     Dose/Rate Route Frequency Ordered Stop   08/17/21 2000  vancomycin (VANCOREADY) IVPB 750 mg/150 mL  Status:  Discontinued        750 mg 150 mL/hr over 60 Minutes Intravenous Every 24 hours 08/16/21 2023 08/17/21 0857   08/17/21 0600  ceFEPIme (MAXIPIME) 2 g in sodium chloride 0.9 % 100 mL IVPB        2 g 200 mL/hr over 30 Minutes Intravenous Every 12 hours 08/16/21 2301     08/16/21 2315  oseltamivir (TAMIFLU) capsule 30 mg        30 mg Oral 2 times daily 08/16/21 2302 08/21/21 2159   08/16/21 2300  oseltamivir (TAMIFLU) capsule 75 mg  Status:  Discontinued        75 mg Oral 2 times daily 08/16/21 2253 08/16/21 2302   08/16/21 2015  vancomycin (VANCOCIN) IVPB 1000 mg/200 mL premix        1,000 mg 200 mL/hr over 60 Minutes Intravenous  Once 08/16/21 2013 08/16/21 2202   08/16/21 1745  cefTRIAXone (ROCEPHIN) 2 g in sodium chloride 0.9 % 100 mL IVPB  Status:  Discontinued        2 g 200 mL/hr over 30 Minutes Intravenous Every 24 hours 08/16/21 1730 08/16/21 1734   08/16/21 1745  azithromycin (ZITHROMAX) 500 mg in sodium chloride 0.9 % 250 mL IVPB  Status:  Discontinued        500 mg 250 mL/hr over 60 Minutes Intravenous Every 24 hours 08/16/21 1730 08/16/21 1734   08/16/21 1745  ceFEPIme (MAXIPIME) 1 g in sodium chloride 0.9 % 100 mL IVPB  Status:  Discontinued        1 g 200 mL/hr over 30 Minutes Intravenous  Once 08/16/21 1734 08/16/21 1742   08/16/21 1745  ceFEPIme (MAXIPIME) 2 g in sodium chloride 0.9 % 100 mL IVPB        2 g 200 mL/hr over 30 Minutes Intravenous  Once 08/16/21 1742 08/16/21 1926           Objective: Vitals:   08/18/21 0700 08/18/21 0749 08/18/21 1200 08/18/21 1553  BP:  111/70 112/68 118/73   Pulse:  82 89 87  Resp:  17 18 16   Temp:  97.9 F (36.6 C) 97.9 F (36.6 C) 98 F (36.7 C)  TempSrc:  Oral Oral Oral  SpO2: 92% 91% 91% 90%  Weight:      Height:        Intake/Output Summary (Last 24 hours)  at 08/18/2021 1643 Last data filed at 08/18/2021 0300 Gross per 24 hour  Intake 200.41 ml  Output --  Net 200.41 ml   Filed Weights   08/16/21 1737  Weight: 45.4 kg    Examination:  General exam: Thin, appear malnourished, less distressed today Respiratory system: less bilateral wheezing , overall very diminished ,tachypnea has resolved. Cardiovascular system: less Sinus tachycardia Gastrointestinal system: Abdomen is nondistended, soft and nontender.  Normal bowel sounds heard. Central nervous system: Alert and oriented. No focal neurological deficits. Extremities:  no edema Skin: No rashes, lesions or ulcers Psychiatry: Judgement and insight appear normal. Mood & affect appropriate.     Data Reviewed: I have personally reviewed following labs and imaging studies  CBC: Recent Labs  Lab 08/16/21 1836 08/18/21 0207  WBC 7.0 10.5  NEUTROABS 5.6 10.1*  HGB 12.1 10.7*  HCT 35.8* 32.1*  MCV 85.9 86.8  PLT 246 017    Basic Metabolic Panel: Recent Labs  Lab 08/16/21 1836 08/17/21 0216 08/18/21 0207  NA 132* 137 137  K 3.2* 4.0 3.7  CL 96* 104 102  CO2 26 26 26   GLUCOSE 110* 103* 156*  BUN 13 12 9   CREATININE 0.74 0.51 0.58  CALCIUM 8.3* 8.4* 8.2*  MG 1.6* 1.7 2.1    GFR: Estimated Creatinine Clearance: 51.6 mL/min (by C-G formula based on SCr of 0.58 mg/dL).  Liver Function Tests: Recent Labs  Lab 08/16/21 1836  AST 20  ALT 21  ALKPHOS 58  BILITOT 0.7  PROT 6.3*  ALBUMIN 2.6*    CBG: No results for input(s): GLUCAP in the last 168 hours.   Recent Results (from the past 240 hour(s))  Resp Panel by RT-PCR (Flu A&B, Covid) Nasopharyngeal Swab     Status: Abnormal   Collection Time: 08/11/21  8:36 AM   Specimen: Nasopharyngeal Swab;  Nasopharyngeal(NP) swabs in vial transport medium  Result Value Ref Range Status   SARS Coronavirus 2 by RT PCR NEGATIVE NEGATIVE Final    Comment: (NOTE) SARS-CoV-2 target nucleic acids are NOT DETECTED.  The SARS-CoV-2 RNA is generally detectable in upper respiratory specimens during the acute phase of infection. The lowest concentration of SARS-CoV-2 viral copies this assay can detect is 138 copies/mL. A negative result does not preclude SARS-Cov-2 infection and should not be used as the sole basis for treatment or other patient management decisions. A negative result may occur with  improper specimen collection/handling, submission of specimen other than nasopharyngeal swab, presence of viral mutation(s) within the areas targeted by this assay, and inadequate number of viral copies(<138 copies/mL). A negative result must be combined with clinical observations, patient history, and epidemiological information. The expected result is Negative.  Fact Sheet for Patients:  EntrepreneurPulse.com.au  Fact Sheet for Healthcare Providers:  IncredibleEmployment.be  This test is no t yet approved or cleared by the [place] FDA and  has been authorized for detection and/or diagnosis of SARS-CoV-2 by FDA under an Emergency Use Authorization (EUA). This EUA will remain  in effect (meaning this test can be used) for the duration of the COVID-19 declaration under Section 564(b)(1) of the Act, 21 U.S.C.section 360bbb-3(b)(1), unless the authorization is terminated  or revoked sooner.       Influenza A by PCR POSITIVE (A) NEGATIVE Final   Influenza B by PCR NEGATIVE NEGATIVE Final    Comment: (NOTE) The Xpert Xpress SARS-CoV-2/FLU/RSV plus assay is intended as an aid in the diagnosis of influenza from Nasopharyngeal swab specimens and should not  be used as a sole basis for treatment. Nasal washings and aspirates are unacceptable for Xpert Xpress  SARS-CoV-2/FLU/RSV testing.  Fact Sheet for Patients: EntrepreneurPulse.com.au  Fact Sheet for Healthcare Providers: IncredibleEmployment.be  This test is not yet approved or cleared by the [place] FDA and has been authorized for detection and/or diagnosis of SARS-CoV-2 by FDA under an Emergency Use Authorization (EUA). This EUA will remain in effect (meaning this test can be used) for the duration of the COVID-19 declaration under Section 564(b)(1) of the Act, 21 U.S.C. section 360bbb-3(b)(1), unless the authorization is terminated or revoked.  Performed at [facility], [place] [address], [place], [place] [address]   Blood culture (routine x 2)     Status: None (Preliminary result)   Collection Time: 08/16/21  5:37 PM   Specimen: BLOOD  Result Value Ref Range Status   Specimen Description BLOOD SITE NOT SPECIFIED  Final   Special Requests   Final    BOTTLES DRAWN AEROBIC AND ANAEROBIC Blood Culture adequate volume   Culture   Final    NO GROWTH 2 DAYS Performed at [facility], [address], [place], [place] [address]    Report Status PENDING  Incomplete  Resp Panel by RT-PCR (Flu A&B, Covid) Nasopharyngeal Swab     Status: Abnormal   Collection Time: 08/16/21  6:31 PM   Specimen: Nasopharyngeal Swab; Nasopharyngeal(NP) swabs in vial transport medium  Result Value Ref Range Status   SARS Coronavirus 2 by RT PCR NEGATIVE NEGATIVE Final    Comment: (NOTE) SARS-CoV-2 target nucleic acids are NOT DETECTED.  The SARS-CoV-2 RNA is generally detectable in upper respiratory specimens during the acute phase of infection. The lowest concentration of SARS-CoV-2 viral copies this assay can detect is 138 copies/mL. A negative result does not preclude SARS-Cov-2 infection and should not be used as the sole basis for treatment or other patient management decisions. A negative result may occur with  improper specimen  collection/handling, submission of specimen other than nasopharyngeal swab, presence of viral mutation(s) within the areas targeted by this assay, and inadequate number of viral copies(<138 copies/mL). A negative result must be combined with clinical observations, patient history, and epidemiological information. The expected result is Negative.  Fact Sheet for Patients:  EntrepreneurPulse.com.au  Fact Sheet for Healthcare Providers:  IncredibleEmployment.be  This test is no t yet approved or cleared by the [place] FDA and  has been authorized for detection and/or diagnosis of SARS-CoV-2 by FDA under an Emergency Use Authorization (EUA). This EUA will remain  in effect (meaning this test can be used) for the duration of the COVID-19 declaration under Section 564(b)(1) of the Act, 21 U.S.C.section 360bbb-3(b)(1), unless the authorization is terminated  or revoked sooner.       Influenza A by PCR POSITIVE (A) NEGATIVE Final   Influenza B by PCR NEGATIVE NEGATIVE Final    Comment: (NOTE) The Xpert Xpress SARS-CoV-2/FLU/RSV plus assay is intended as an aid in the diagnosis of influenza from Nasopharyngeal swab specimens and should not be used as a sole basis for treatment. Nasal washings and aspirates are unacceptable for Xpert Xpress SARS-CoV-2/FLU/RSV testing.  Fact Sheet for Patients: EntrepreneurPulse.com.au  Fact Sheet for Healthcare Providers: IncredibleEmployment.be  This test is not yet approved or cleared by the [place] FDA and has been authorized for detection and/or diagnosis of SARS-CoV-2 by FDA under an Emergency Use Authorization (EUA). This EUA will remain in effect (meaning this test can be used) for  the duration of the COVID-19 declaration under Section 564(b)(1) of the Act, 21 U.S.C. section 360bbb-3(b)(1), unless the authorization is terminated or revoked.  Performed at [facility], [place] [address], [place], [place] [address]   MRSA Next Gen by PCR, Nasal     Status: None   Collection Time: 08/16/21 10:52 PM   Specimen: Nasal Mucosa; Nasal Swab  Result Value Ref Range Status   MRSA by PCR Next Gen NOT DETECTED NOT DETECTED Final    Comment: (NOTE) The GeneXpert MRSA Assay (FDA approved for NASAL specimens only), is one component of a comprehensive MRSA colonization surveillance program. It is not intended to diagnose MRSA infection nor to guide or monitor treatment for MRSA infections. Test performance is not FDA approved in patients less than 65 years old. Performed at [facility], [place] [address], [place], [place] [address]   Blood culture (routine x 2)     Status: None (Preliminary result)   Collection Time: 08/17/21  2:16 AM   Specimen: BLOOD RIGHT HAND  Result Value Ref Range Status   Specimen Description BLOOD RIGHT HAND  Final   Special Requests   Final    BOTTLES DRAWN AEROBIC AND ANAEROBIC Blood Culture adequate volume   Culture   Final    NO GROWTH 1 DAY Performed at [facility], [place] [address] [place], [place] [address]    Report Status PENDING  Incomplete         Radiology Studies: CT ABDOMEN PELVIS W CONTRAST  Result Date: 08/16/2021 CLINICAL DATA:  Sepsis, acute abdominal pain. History of lung cancer. EXAM: CT ABDOMEN AND PELVIS WITH CONTRAST TECHNIQUE: Multidetector CT imaging of the abdomen and pelvis was performed using the standard protocol following bolus administration of intravenous contrast. CONTRAST:  77mL OMNIPAQUE IOHEXOL 300 MG/ML  SOLN COMPARISON:  CT renal stone 05/03/2021. FINDINGS: Lower chest: There are patchy multifocal ground-glass and airspace opacities within the lung bases, left greater than right. Hepatobiliary: No focal liver abnormality is seen. No gallstones, gallbladder wall thickening, or biliary dilatation. Pancreas: Unremarkable. No pancreatic ductal dilatation or surrounding  inflammatory changes. Spleen: Normal in size without focal abnormality. Adrenals/Urinary Tract: There is mild right-sided hydroureteronephrosis. No definite obstructing calculus identified. Bladder, left kidney and adrenal glands are within normal limits. Stomach/Bowel: Stomach is within normal limits. Appendix is not seen. No evidence of bowel wall thickening, distention, or inflammatory changes. Vascular/Lymphatic: Aortic atherosclerosis. No enlarged abdominal or pelvic lymph nodes. Reproductive: Status post hysterectomy. No adnexal masses. Other: There is trace free fluid in the pelvis. There is no focal abdominal wall hernia. Musculoskeletal: There is stable grade 1 anterolisthesis at L4-L5, likely degenerative. IMPRESSION: 1. Bilateral lower lung multifocal ground-glass and airspace opacities worrisome for infection. 2. Mild right-sided hydroureteronephrosis without obstructing calculus identified. Findings may be related to recently passed calculus (nonvisualized) or ureteral stricture/mass. Recommend clinical correlation and follow-up. 3. Trace free fluid in the pelvis. 4.  Aortic Atherosclerosis (ICD10-I70.0). Electronically Signed   By: Ronney Asters M.D.   On: 08/16/2021 20:44   DG Chest Port 1 View  Result Date: 08/16/2021 CLINICAL DATA:  Shortness of breath and fever. History of lung cancer EXAM: PORTABLE CHEST 1 VIEW COMPARISON:  Chest x-ray 08/11/2021, CT chest 04/09/2020 FINDINGS: The heart and mediastinal contours are within normal limits. Right to left mediastinal shift. Left lung surgical changes. Biapical pleural/pulmonary scarring. No focal consolidation. Chronic coarsened interstitial markings with overlying patchy airspace opacity along the left lower lobe. Similar  finding along the right lower lobe. Persistent blunting of the left costophrenic angle with possible overlying trace left pleural effusion. No right pleural effusion. No acute osseous abnormality. IMPRESSION: Emphysema  ([id number]) with liky superimposed bilateral lower lobe infection/inflammation. Possible trace left pleural effusion. Electronically Signed   By: Iven Finn M.D.   On: 08/16/2021 17:50        Scheduled Meds:  busPIRone  5 mg Oral TID   enoxaparin (LOVENOX) injection  40 mg Subcutaneous QHS   famotidine  40 mg Oral Daily   feeding supplement  237 mL Oral BID BM   guaiFENesin  600 mg Oral BID   levothyroxine  137 mcg Oral QAC breakfast   methylPREDNISolone (SOLU-MEDROL) injection  40 mg Intravenous Q12H   mometasone-formoterol  2 puff Inhalation BID   multivitamin with minerals  1 tablet Oral Daily   oseltamivir  30 mg Oral BID   oxybutynin  5 mg Oral TID   pantoprazole  40 mg Oral Daily   polyethylene glycol  17 g Oral Daily   QUEtiapine  300 mg Oral QHS   rosuvastatin  20 mg Oral Daily   sertraline  100 mg Oral BID   tamsulosin  0.4 mg Oral Daily   venlafaxine XR  75 mg Oral Q breakfast   Continuous Infusions:  ceFEPime (MAXIPIME) IV 2 g (08/18/21 0529)     LOS: 2 days   Time spent: 45mins Greater than 50% of this time was spent in counseling, explanation of diagnosis, planning of further management, and coordination of care.   Voice Recognition Viviann Spare dictation system was used to create this note, attempts have been made to correct errors. Please contact the author with questions and/or clarifications.   Florencia Reasons, MD PhD FACP [facility]  Available via Epic secure chat 7am-7pm for nonurgent issues Please page for urgent issues To page the attending provider between 7A-7P or the covering provider during after hours 7P-7A, please log into the web site [email or web address] and access using universal [place] password for that web site. If you do not have the password, please call the hospital operator.    08/18/2021, 4:43 PM

## 2021-08-19 ENCOUNTER — Inpatient Hospital Stay (HOSPITAL_COMMUNITY): Payer: Medicare Other

## 2021-08-19 LAB — URINALYSIS, ROUTINE W REFLEX MICROSCOPIC
Bacteria, UA: NONE SEEN
Bilirubin Urine: NEGATIVE
Glucose, UA: NEGATIVE mg/dL
Hgb urine dipstick: NEGATIVE
Ketones, ur: 5 mg/dL — AB
Leukocytes,Ua: NEGATIVE
Nitrite: NEGATIVE
Protein, ur: 30 mg/dL — AB
Specific Gravity, Urine: 1.031 — ABNORMAL HIGH (ref 1.005–1.030)
pH: 6 (ref 5.0–8.0)

## 2021-08-19 LAB — MAGNESIUM: Magnesium: 2.2 mg/dL (ref 1.7–2.4)

## 2021-08-19 LAB — BASIC METABOLIC PANEL
Anion gap: 10 (ref 5–15)
BUN: 16 mg/dL (ref 8–23)
CO2: 25 mmol/L (ref 22–32)
Calcium: 8.6 mg/dL — ABNORMAL LOW (ref 8.9–10.3)
Chloride: 106 mmol/L (ref 98–111)
Creatinine, Ser: 0.57 mg/dL (ref 0.44–1.00)
GFR, Estimated: >60 mL/min (ref >60)
Glucose, Bld: 130 mg/dL — ABNORMAL HIGH (ref 70–99)
Potassium: 3.5 mmol/L (ref 3.5–5.1)
Sodium: 141 mmol/L (ref 135–145)

## 2021-08-19 LAB — PROCALCITONIN: Procalcitonin: 0.46 ng/mL

## 2021-08-19 MED ORDER — POLYETHYLENE GLYCOL 3350 17 G PO PACK
17.0000 g | PACK | Freq: Two times a day (BID) | ORAL | Status: DC
  Administered 2021-08-19 – 2021-08-24 (×9): 17 g via ORAL
  Filled 2021-08-19 (×13): qty 1

## 2021-08-19 MED ORDER — SODIUM CHLORIDE 0.9 % IV SOLN
2.0000 g | INTRAVENOUS | Status: DC
  Administered 2021-08-19 – 2021-08-24 (×6): 2 g via INTRAVENOUS
  Filled 2021-08-19 (×6): qty 20

## 2021-08-19 MED ORDER — PREDNISONE 20 MG PO TABS
30.0000 mg | ORAL_TABLET | Freq: Every day | ORAL | Status: DC
  Administered 2021-08-20 – 2021-08-25 (×6): 30 mg via ORAL
  Filled 2021-08-19 (×5): qty 1

## 2021-08-19 NOTE — Consult Note (Signed)
Reason for Consult: Right FLank Pain, Mild Hydronephrosis, History of Right Ureteral Stone  Referring Physician: Florencia Reasons MD  Tracy Park is an 65 y.o. female.   HPI:   1 - Right FLank Pain, Mild Hydronephrosis, History of Right Ureteral Stone - s/p right ureteroscopy by R. Tommi Rumps with [facility] in [place] for right ureteral stone 06/2021, was stented prior 04/2021 by [facility]ckel in [place]. Noted incidetnal right flank pain during admission for flu 08/2021 and repeat CT shows stone free but some persistent (improved) Rt hydro to distal ureter. No delayed nephrogram to suggests significant obstruction. UA normal this admission.   Today "Tracy Park" is seen in consultation for above. She is s/p stone surgery at another facility abtou 2 months ago.   Past Medical History:  Diagnosis Date   Anginal pain ([place])    Anxiety    Bipolar disorder ([place])    Cancer ([place])    COPD (chronic obstructive pulmonary disease) ([place])    Dyspnea    Family history of adverse reaction to anesthesia    History of kidney stones    Hydroureteronephrosis 08/16/2021   Hypothyroidism    Lung cancer ([place])    Myocardial infarction ([place])    PTSD (post-traumatic stress disorder)    Sleep apnea    Thyroid disease     Past Surgical History:  Procedure Laterality Date   ABDOMINAL HYSTERECTOMY     BACK SURGERY     CYSTOSCOPY W/ URETERAL STENT PLACEMENT Right 05/03/2021   Procedure: CYSTOSCOPY WITH RETROGRADE PYELOGRAM/URETERAL STENT PLACEMENT;  Surgeon: Ardis Hughs, MD;  Location: [facility];  Service: Urology;  Laterality: Right;   EYE SURGERY     kidney stent     thyroidectomy      Family History  Family history unknown: Yes    Social History:  reports that she has been smoking cigarettes. She has never used smokeless tobacco. She reports that she does not currently use alcohol. She reports that she does not currently use drugs.  Allergies:  Allergies  Allergen Reactions   Red Dye Hives and Itching    Oxycontin [Oxycodone Hcl]     Other reaction(s): Hallucinations   Aspirin Hives   Tape Rash    ONLY USE PAPER TAPE    Medications: I have reviewed the patient's current medications.  Results for orders placed or performed during the hospital encounter of 08/16/21 (from the past 48 hour(s))  CBC with Differential/Platelet     Status: Abnormal   Collection Time: 08/18/21  2:07 AM  Result Value Ref Range   WBC 10.5 4.0 - 10.5 K/uL   RBC 3.70 (L) 3.87 - 5.11 MIL/uL   Hemoglobin 10.7 (L) 12.0 - 15.0 g/dL   HCT 32.1 (L) 36.0 - 46.0 %   MCV 86.8 80.0 - 100.0 fL   MCH 28.9 26.0 - 34.0 pg   MCHC 33.3 30.0 - 36.0 g/dL   RDW 14.7 11.5 - 15.5 %   Platelets 296 150 - 400 K/uL   nRBC 0.0 0.0 - 0.2 %   Neutrophils Relative % 76 %   Neutro Abs 10.1 (H) 1.7 - 7.7 K/uL   Band Neutrophils 20 %   Lymphocytes Relative 3 %   Lymphs Abs 0.3 (L) 0.7 - 4.0 K/uL   Monocytes Relative 1 %   Monocytes Absolute 0.1 0.1 - 1.0 K/uL   Eosinophils Relative 0 %   Eosinophils Absolute 0.0 0.0 - 0.5 K/uL   Basophils Relative 0 %   Basophils Absolute 0.0  0.0 - 0.1 K/uL   WBC Morphology INCREASED BANDS (>20% BANDS)    nRBC 0 0 /100 WBC   Abs Immature Granulocytes 0.00 0.00 - 0.07 K/uL    Comment: Performed at [facility], [place] [address], [place], [place] [address]  Basic metabolic panel     Status: Abnormal   Collection Time: 08/18/21  2:07 AM  Result Value Ref Range   Sodium 137 135 - 145 mmol/L   Potassium 3.7 3.5 - 5.1 mmol/L   Chloride 102 98 - 111 mmol/L   CO2 26 22 - 32 mmol/L   Glucose, Bld 156 (H) 70 - 99 mg/dL    Comment: Glucose reference range applies only to samples taken after fasting for at least 8 hours.   BUN 9 8 - 23 mg/dL   Creatinine, Ser 0.58 0.44 - 1.00 mg/dL   Calcium 8.2 (L) 8.9 - 10.3 mg/dL   GFR, Estimated >60 >60 mL/min    Comment: (NOTE) Calculated using the CKD-EPI Creatinine Equation (2021)    Anion gap 9 5 - 15    Comment: Performed at [facility] [address] [place], [place] [address]  Magnesium     Status: None   Collection Time: 08/18/21  2:07 AM  Result Value Ref Range   Magnesium 2.1 1.7 - 2.4 mg/dL    Comment: Performed at [facility] [address] [place], [place] [address]  Procalcitonin     Status: None   Collection Time: 08/18/21  2:07 AM  Result Value Ref Range   Procalcitonin 0.64 ng/mL    Comment:        Interpretation: PCT > 0.5 ng/mL and <= 2 ng/mL: Systemic infection (sepsis) is possible, but other conditions are known to elevate PCT as well. (NOTE)       Sepsis PCT Algorithm           Lower Respiratory Tract                                      Infection PCT Algorithm    ----------------------------     ----------------------------         PCT < 0.25 ng/mL                PCT < 0.10 ng/mL          Strongly encourage             Strongly discourage   discontinuation of antibiotics    initiation of antibiotics    ----------------------------     -----------------------------       PCT 0.25 - 0.50 ng/mL            PCT 0.10 - 0.25 ng/mL               OR       >80% decrease in PCT            Discourage initiation of                                            antibiotics      Encourage discontinuation           of antibiotics    ----------------------------     -----------------------------  PCT >= 0.50 ng/mL              PCT 0.26 - 0.50 ng/mL                AND       <80% decrease in PCT             Encourage initiation of                                             antibiotics       Encourage continuation           of antibiotics    ----------------------------     -----------------------------        PCT >= 0.50 ng/mL                  PCT > 0.50 ng/mL               AND         increase in PCT                  Strongly encourage                                      initiation of antibiotics    Strongly encourage escalation           of antibiotics                                      -----------------------------                                           PCT <= 0.25 ng/mL                                                 OR                                        > 80% decrease in PCT                                      Discontinue / Do not initiate                                             antibiotics  Performed at [facility], [address] [place], [place] [address]   Procalcitonin     Status: None   Collection Time: 08/19/21  1:49 AM  Result Value Ref Range   Procalcitonin 0.46 ng/mL    Comment:        Interpretation: PCT (Procalcitonin) <= 0.5 ng/mL: Systemic infection (sepsis) is not likely.  Local bacterial infection is possible. (NOTE)       Sepsis PCT Algorithm           Lower Respiratory Tract                                      Infection PCT Algorithm    ----------------------------     ----------------------------         PCT < 0.25 ng/mL                PCT < 0.10 ng/mL          Strongly encourage             Strongly discourage   discontinuation of antibiotics    initiation of antibiotics    ----------------------------     -----------------------------       PCT 0.25 - 0.50 ng/mL            PCT 0.10 - 0.25 ng/mL               OR       >80% decrease in PCT            Discourage initiation of                                            antibiotics      Encourage discontinuation           of antibiotics    ----------------------------     -----------------------------         PCT >= 0.50 ng/mL              PCT 0.26 - 0.50 ng/mL               AND        <80% decrease in PCT             Encourage initiation of                                             antibiotics       Encourage continuation           of antibiotics    ----------------------------     -----------------------------        PCT >= 0.50 ng/mL                  PCT > 0.50 ng/mL               AND         increase in PCT                  Strongly encourage                                       initiation of antibiotics    Strongly encourage escalation           of antibiotics                                     -----------------------------  PCT <= 0.25 ng/mL                                                 OR                                        > 80% decrease in PCT                                      Discontinue / Do not initiate                                             antibiotics  Performed at [facility], [place] [address], [place], [place] [address]   Basic metabolic panel     Status: Abnormal   Collection Time: 08/19/21  1:49 AM  Result Value Ref Range   Sodium 141 135 - 145 mmol/L   Potassium 3.5 3.5 - 5.1 mmol/L   Chloride 106 98 - 111 mmol/L   CO2 25 22 - 32 mmol/L   Glucose, Bld 130 (H) 70 - 99 mg/dL    Comment: Glucose reference range applies only to samples taken after fasting for at least 8 hours.   BUN 16 8 - 23 mg/dL   Creatinine, Ser 0.57 0.44 - 1.00 mg/dL   Calcium 8.6 (L) 8.9 - 10.3 mg/dL   GFR, Estimated >60 >60 mL/min    Comment: (NOTE) Calculated using the CKD-EPI Creatinine Equation (2021)    Anion gap 10 5 - 15    Comment: Performed at [facility] [address] [place], [place] [address]  Magnesium     Status: None   Collection Time: 08/19/21  1:49 AM  Result Value Ref Range   Magnesium 2.2 1.7 - 2.4 mg/dL    Comment: Performed at [facility] [address], [place], [place] [address]    US RENAL  Result Date: 08/19/2021 CLINICAL DATA:  Hydronephrosis. EXAM: RENAL / URINARY TRACT ULTRASOUND COMPLETE COMPARISON:  CT AP, 08/16/2021 and 05/03/2021. US Abdomen, 05/03/2021. FINDINGS: Right Kidney: Renal measurements: 12.4 x 4.0 x 6.1 cm = volume: 160 mL. Echogenicity within normal limits. No mass is visualized. Mild-to-moderate volume of hydronephrosis is present. Left Kidney: Renal measurements: 11.8 x 4.0 x 4.4 cm = volume: 107 mL. Echogenicity within  normal limits. No mass or hydronephrosis visualized. Bladder: Appears normal for degree of bladder distention. Other: No ascites IMPRESSION: 1. Mild-to-moderate volume RIGHT hydronephrosis. 2. Normal LEFT renal collecting system. Electronically Signed   By: Michaelle Birks M.D.   On: 08/19/2021 11:24    Review of Systems  Constitutional:  Positive for fatigue.  Respiratory:  Positive for cough, shortness of breath and wheezing.   Blood pressure 134/81, pulse 80, temperature (!) 97.4 F (36.3 C), resp. rate 18, height 5\' 3"  (1.6 m), weight 45.4 kg, SpO2 93 %. Physical Exam Vitals reviewed.  Constitutional:      Comments: Very thin. Appears older than stated age.   HENT:     Head: Normocephalic.  Nose: Nose normal.  Eyes:     Pupils: Pupils are equal, round, and reactive to light.  Cardiovascular:     Rate and Rhythm: Normal rate.  Pulmonary:     Comments: Some mild diffuse wheezes that appear chronic. Some mild use of accessory musclces.  Abdominal:     General: Abdomen is flat.  Genitourinary:    Comments: No severe CVAT at present.  Musculoskeletal:        General: Normal range of motion.  Skin:    General: Skin is warm.  Neurological:     General: No focal deficit present.     Mental Status: She is alert.  Psychiatric:        Mood and Affect: Mood normal.    Assessment/Plan:  1 - Right FLank Pain, Mild Hydronephrosis, History of Right Ureteral Stone - unclear if chronic from her prior stone or perhaps indicative of some very mild partial obstruction from stricture. Her imaging is certainly unimpressive for any acute or severe obstruction.  Either way, do NOT favor any surgical intervention given flu and baseline pulm dysfunction.    Consider repeat elective diagnostic ureteroscopy or nuc med renogram to rule out significant obstruction if bother persists in outpatient setting. She will call for appt if her bother persists. At this point she adamantly wants to avoid any  furhter procedural intervention.     Alexis Frock 08/19/2021, 4:34 PM

## 2021-08-19 NOTE — Progress Notes (Addendum)
PROGRESS NOTE    Tracy Park  OAC:[id number] DOB: 06/17/1956 DOA: 08/16/2021 PCP: Irving Shows, PA-C    Chief Complaint  Patient presents with   Code Sepsis    Brief Narrative:  H/o OSA, COPD, chronic hypoxic respiratory failure on 2 L home oxygen,  lung cancer status postchemotherapy and surgery two years ago who was Recently seen in the ED on 12/3 for flulike symptoms and tested positive for influenza A but chest x-ray did not show pneumonia.  She returns to the ED today for evaluation of worsening shortness of breath, cough, fever, and abdominal pain.  On EMS arrival, satting 87% on room air.  Oxygen saturation improved to 94% with 2 L home oxygen.  Febrile with temperature 100.7 F.  Tachycardic with heart rate in the 110s.  Tachypneic with respiratory rate in the mid to high 20s.  Influenza A PCR positive.  COVID-negative.   Subjective:  Patient Reports had a rough night , not able to lay flat due to sob and vomited AM RN states did not received any report of this from night RN Continued intermittent productive cough, less wheezing No fever  Assessment & Plan:   Principal Problem:   Influenza A with pneumonia Active Problems:   Sepsis ([place])   Hyponatremia   Hypokalemia   Hypomagnesemia  Sepsis from influenza A with possible pneumonia -Represented with fever, tachycardia, tachypnea -started on Tamiflu and empiric antibiotics, sputum culture ordered pending collection  -Blood culture no growth to date, MRSA screening negative  COPD exacerbation, chronic hypoxic respiratory failure on home O2 Bilateral wheezing on exam on 12/9 Continue antibiotics, started IV steroid, continue nebulizer, Mucinex Less wheezing Change to oral prednisone   Elevated fasting blood glucose likely due to steroid, will check A1c  Mild hyponatremia on presentation Sodium normalized after hydration  Hypokalemia/hypomagnesemia Replaced , improved     Suprapubic abdominal pain CT  without acute etiology.  Denies dysuria.  Reports chronic urinary urgency and takes oxybutynin at home. -ua no sign of infection   Mild right-sided hydroureteronephrosis -During hospital admission in August 2022, patient was found to have right-sided hydronephrosis with 6 mm obstructing calculus for which she underwent cystoscopy and right ureteral stent placement by urology.   -CT abdomen pelvis done on admission showing mild right-sided hydroureteronephrosis without obstructing calculus. ?Urethral stricture/ mass.  -Continue Flomax.   - cr wnl , ua on presentation unremarkable, but started to c/o significant right CTA pain and has right CVA tenderness on exam on 12/11, will repeat ua and get renal US to eval right side hydro, may need inhouse urology consult -she follows with [facility] regularly before admission    Case discussed with urology Dr Tresa Moore     GERD -Continue PPI and H2 blocker   Hyperlipidemia -Continue statin  Hypothyroidism, h/o thyroidectomy  -Continue Synthroid    Anxiety, depression, bipolar disorder -Continue home meds   Nutritional Assessment:  The patient's BMI is: Body mass index is 17.71 kg/m.[name]  Seen by dietician.  I agree with the assessment and plan as outlined below:  Nutrition Status: Nutrition Problem: Increased nutrient needs Etiology: acute illness (sepsis secondary to influenza A pneumonia) Signs/Symptoms: estimated needs Interventions: Ensure Enlive (each supplement provides 350kcal and 20 grams of protein), MVI, Liberalize Diet      Unresulted Labs (From admission, onward)     Start     Ordered   08/20/21 0500  Hemoglobin A1c  Tomorrow morning,   R  08/19/21 0950   08/19/21 0947  Urinalysis, Routine w reflex microscopic  Once,   R        08/19/21 0946   08/19/21 2725  Basic metabolic panel  Daily,   R      08/18/21 1642   08/17/21 1745  Strep pneumoniae urinary antigen  Once,   R        08/17/21 1744   08/17/21 1745   Expectorated Sputum Assessment w Gram Stain, Rflx to Resp Cult  Once,   R        08/17/21 1744   08/16/21 2353  Legionella Pneumophila Serogp 1 Ur Ag  Once,   R        08/16/21 2353              DVT prophylaxis: enoxaparin (LOVENOX) injection 40 mg Start: 08/16/21 2315   Code Status:DNR Family Communication: Patient Disposition:   Status is: Inpatient  Dispo: The patient is from: Home              Anticipated d/c is to: Home              Anticipated d/c date is: TBD, f/u on repeat ua and renal US, may need inhouse urology vs outaptient urology follow up pending on test result                Consultants:  None  Procedures:  None  Antimicrobials:   Anti-infectives (From admission, onward)    Start     Dose/Rate Route Frequency Ordered Stop   08/17/21 2000  vancomycin (VANCOREADY) IVPB 750 mg/150 mL  Status:  Discontinued        750 mg 150 mL/hr over 60 Minutes Intravenous Every 24 hours 08/16/21 2023 08/17/21 0857   08/17/21 0600  ceFEPIme (MAXIPIME) 2 g in sodium chloride 0.9 % 100 mL IVPB        2 g 200 mL/hr over 30 Minutes Intravenous Every 12 hours 08/16/21 2301     08/16/21 2315  oseltamivir (TAMIFLU) capsule 30 mg        30 mg Oral 2 times daily 08/16/21 2302 08/21/21 2159   08/16/21 2300  oseltamivir (TAMIFLU) capsule 75 mg  Status:  Discontinued        75 mg Oral 2 times daily 08/16/21 2253 08/16/21 2302   08/16/21 2015  vancomycin (VANCOCIN) IVPB 1000 mg/200 mL premix        1,000 mg 200 mL/hr over 60 Minutes Intravenous  Once 08/16/21 2013 08/16/21 2202   08/16/21 1745  cefTRIAXone (ROCEPHIN) 2 g in sodium chloride 0.9 % 100 mL IVPB  Status:  Discontinued        2 g 200 mL/hr over 30 Minutes Intravenous Every 24 hours 08/16/21 1730 08/16/21 1734   08/16/21 1745  azithromycin (ZITHROMAX) 500 mg in sodium chloride 0.9 % 250 mL IVPB  Status:  Discontinued        500 mg 250 mL/hr over 60 Minutes Intravenous Every 24 hours 08/16/21 1730 08/16/21 1734    08/16/21 1745  ceFEPIme (MAXIPIME) 1 g in sodium chloride 0.9 % 100 mL IVPB  Status:  Discontinued        1 g 200 mL/hr over 30 Minutes Intravenous  Once 08/16/21 1734 08/16/21 1742   08/16/21 1745  ceFEPIme (MAXIPIME) 2 g in sodium chloride 0.9 % 100 mL IVPB        2 g 200 mL/hr over 30 Minutes Intravenous  Once 08/16/21 1742 08/16/21 1926  Objective: Vitals:   08/18/21 1553 08/18/21 1958 08/18/21 2127 08/19/21 0741  BP: 118/73 (!) 153/120  134/81  Pulse: 87 93  80  Resp: 16 17  18   Temp: 98 F (36.7 C) 97.6 F (36.4 C)  (!) 97.4 F (36.3 C)  TempSrc: Oral Oral    SpO2: 90% 92% 93% 96%  Weight:      Height:       No intake or output data in the 24 hours ending 08/19/21 0953  Filed Weights   08/16/21 1737  Weight: 45.4 kg    Examination:  General exam: Thin, appear malnourished, sitting up in chair, does not appear to be in acute distress , though reports not feeling well Respiratory system: less bilateral wheezing , overall very diminished ,tachypnea has resolved. Cardiovascular system: Sinus tachycardia has resolved, now RRR Gastrointestinal system: Abdomen is nondistended, soft and nontender.  Normal bowel sounds heard. Right CVA tenderness  Central nervous system: Alert and oriented. No focal neurological deficits. Extremities:  no edema Skin: No rashes, lesions or ulcers Psychiatry: Judgement and insight appear normal. Mood & affect appropriate.     Data Reviewed: I have personally reviewed following labs and imaging studies  CBC: Recent Labs  Lab 08/16/21 1836 08/18/21 0207  WBC 7.0 10.5  NEUTROABS 5.6 10.1*  HGB 12.1 10.7*  HCT 35.8* 32.1*  MCV 85.9 86.8  PLT 246 614    Basic Metabolic Panel: Recent Labs  Lab 08/16/21 1836 08/17/21 0216 08/18/21 0207 08/19/21 0149  NA 132* 137 137 141  K 3.2* 4.0 3.7 3.5  CL 96* 104 102 106  CO2 26 26 26 25   GLUCOSE 110* 103* 156* 130*  BUN 13 12 9 16   CREATININE 0.74 0.51 0.58 0.57   CALCIUM 8.3* 8.4* 8.2* 8.6*  MG 1.6* 1.7 2.1 2.2    GFR: Estimated Creatinine Clearance: 51.6 mL/min (by C-G formula based on SCr of 0.57 mg/dL).  Liver Function Tests: Recent Labs  Lab 08/16/21 1836  AST 20  ALT 21  ALKPHOS 58  BILITOT 0.7  PROT 6.3*  ALBUMIN 2.6*    CBG: No results for input(s): GLUCAP in the last 168 hours.   Recent Results (from the past 240 hour(s))  Resp Panel by RT-PCR (Flu A&B, Covid) Nasopharyngeal Swab     Status: Abnormal   Collection Time: 08/11/21  8:36 AM   Specimen: Nasopharyngeal Swab; Nasopharyngeal(NP) swabs in vial transport medium  Result Value Ref Range Status   SARS Coronavirus 2 by RT PCR NEGATIVE NEGATIVE Final    Comment: (NOTE) SARS-CoV-2 target nucleic acids are NOT DETECTED.  The SARS-CoV-2 RNA is generally detectable in upper respiratory specimens during the acute phase of infection. The lowest concentration of SARS-CoV-2 viral copies this assay can detect is 138 copies/mL. A negative result does not preclude SARS-Cov-2 infection and should not be used as the sole basis for treatment or other patient management decisions. A negative result may occur with  improper specimen collection/handling, submission of specimen other than nasopharyngeal swab, presence of viral mutation(s) within the areas targeted by this assay, and inadequate number of viral copies(<138 copies/mL). A negative result must be combined with clinical observations, patient history, and epidemiological information. The expected result is Negative.  Fact Sheet for Patients:  EntrepreneurPulse.com.au  Fact Sheet for Healthcare Providers:  IncredibleEmployment.be  This test is no t yet approved or cleared by the [place] FDA and  has been authorized for detection and/or diagnosis of SARS-CoV-2 by FDA under an  Emergency Use Authorization (EUA). This EUA will remain  in effect (meaning this test can be used) for  the duration of the COVID-19 declaration under Section 564(b)(1) of the Act, 21 U.S.C.section 360bbb-3(b)(1), unless the authorization is terminated  or revoked sooner.       Influenza A by PCR POSITIVE (A) NEGATIVE Final   Influenza B by PCR NEGATIVE NEGATIVE Final    Comment: (NOTE) The Xpert Xpress SARS-CoV-2/FLU/RSV plus assay is intended as an aid in the diagnosis of influenza from Nasopharyngeal swab specimens and should not be used as a sole basis for treatment. Nasal washings and aspirates are unacceptable for Xpert Xpress SARS-CoV-2/FLU/RSV testing.  Fact Sheet for Patients: EntrepreneurPulse.com.au  Fact Sheet for Healthcare Providers: IncredibleEmployment.be  This test is not yet approved or cleared by the [place] FDA and has been authorized for detection and/or diagnosis of SARS-CoV-2 by FDA under an Emergency Use Authorization (EUA). This EUA will remain in effect (meaning this test can be used) for the duration of the COVID-19 declaration under Section 564(b)(1) of the Act, 21 U.S.C. section 360bbb-3(b)(1), unless the authorization is terminated or revoked.  Performed at [facility], [place] [address] [place], [place] [address]   Blood culture (routine x 2)     Status: None (Preliminary result)   Collection Time: 08/16/21  5:37 PM   Specimen: BLOOD  Result Value Ref Range Status   Specimen Description BLOOD SITE NOT SPECIFIED  Final   Special Requests   Final    BOTTLES DRAWN AEROBIC AND ANAEROBIC Blood Culture adequate volume   Culture   Final    NO GROWTH 3 DAYS Performed at [facility], [address] [place], [place] [address]    Report Status PENDING  Incomplete  Resp Panel by RT-PCR (Flu A&B, Covid) Nasopharyngeal Swab     Status: Abnormal   Collection Time: 08/16/21  6:31 PM   Specimen: Nasopharyngeal Swab; Nasopharyngeal(NP) swabs in vial transport medium  Result Value Ref Range  Status   SARS Coronavirus 2 by RT PCR NEGATIVE NEGATIVE Final    Comment: (NOTE) SARS-CoV-2 target nucleic acids are NOT DETECTED.  The SARS-CoV-2 RNA is generally detectable in upper respiratory specimens during the acute phase of infection. The lowest concentration of SARS-CoV-2 viral copies this assay can detect is 138 copies/mL. A negative result does not preclude SARS-Cov-2 infection and should not be used as the sole basis for treatment or other patient management decisions. A negative result may occur with  improper specimen collection/handling, submission of specimen other than nasopharyngeal swab, presence of viral mutation(s) within the areas targeted by this assay, and inadequate number of viral copies(<138 copies/mL). A negative result must be combined with clinical observations, patient history, and epidemiological information. The expected result is Negative.  Fact Sheet for Patients:  EntrepreneurPulse.com.au  Fact Sheet for Healthcare Providers:  IncredibleEmployment.be  This test is no t yet approved or cleared by the [place] FDA and  has been authorized for detection and/or diagnosis of SARS-CoV-2 by FDA under an Emergency Use Authorization (EUA). This EUA will remain  in effect (meaning this test can be used) for the duration of the COVID-19 declaration under Section 564(b)(1) of the Act, 21 U.S.C.section 360bbb-3(b)(1), unless the authorization is terminated  or revoked sooner.       Influenza A by PCR POSITIVE (A) NEGATIVE Final   Influenza B by PCR NEGATIVE NEGATIVE Final    Comment: (NOTE) The Xpert Xpress SARS-CoV-2/FLU/RSV plus assay is  intended as an aid in the diagnosis of influenza from Nasopharyngeal swab specimens and should not be used as a sole basis for treatment. Nasal washings and aspirates are unacceptable for Xpert Xpress SARS-CoV-2/FLU/RSV testing.  Fact Sheet for  Patients: EntrepreneurPulse.com.au  Fact Sheet for Healthcare Providers: IncredibleEmployment.be  This test is not yet approved or cleared by the [place] FDA and has been authorized for detection and/or diagnosis of SARS-CoV-2 by FDA under an Emergency Use Authorization (EUA). This EUA will remain in effect (meaning this test can be used) for the duration of the COVID-19 declaration under Section 564(b)(1) of the Act, 21 U.S.C. section 360bbb-3(b)(1), unless the authorization is terminated or revoked.  Performed at [facility], [place] [address] [place], [place] [address]   MRSA Next Gen by PCR, Nasal     Status: None   Collection Time: 08/16/21 10:52 PM   Specimen: Nasal Mucosa; Nasal Swab  Result Value Ref Range Status   MRSA by PCR Next Gen NOT DETECTED NOT DETECTED Final    Comment: (NOTE) The GeneXpert MRSA Assay (FDA approved for NASAL specimens only), is one component of a comprehensive MRSA colonization surveillance program. It is not intended to diagnose MRSA infection nor to guide or monitor treatment for MRSA infections. Test performance is not FDA approved in patients less than 35 years old. Performed at [facility], [place] [address], [place], [place] [address]   Blood culture (routine x 2)     Status: None (Preliminary result)   Collection Time: 08/17/21  2:16 AM   Specimen: BLOOD RIGHT HAND  Result Value Ref Range Status   Specimen Description BLOOD RIGHT HAND  Final   Special Requests   Final    BOTTLES DRAWN AEROBIC AND ANAEROBIC Blood Culture adequate volume   Culture   Final    NO GROWTH 2 DAYS Performed at [facility], [place] [address] [place],  [address]    Report Status PENDING  Incomplete         Radiology Studies: No results found.      Scheduled Meds:  busPIRone  5 mg Oral TID   enoxaparin (LOVENOX) injection  40 mg Subcutaneous QHS   famotidine  40 mg Oral Daily    feeding supplement  237 mL Oral BID BM   guaiFENesin  600 mg Oral BID   levothyroxine  137 mcg Oral QAC breakfast   methylPREDNISolone (SOLU-MEDROL) injection  40 mg Intravenous Q12H   mometasone-formoterol  2 puff Inhalation BID   multivitamin with minerals  1 tablet Oral Daily   oseltamivir  30 mg Oral BID   oxybutynin  5 mg Oral TID   pantoprazole  40 mg Oral Daily   polyethylene glycol  17 g Oral BID   QUEtiapine  300 mg Oral QHS   rosuvastatin  20 mg Oral Daily   sertraline  100 mg Oral BID   tamsulosin  0.4 mg Oral Daily   venlafaxine XR  75 mg Oral Q breakfast   Continuous Infusions:  ceFEPime (MAXIPIME) IV 2 g (08/19/21 9147)     LOS: 3 days   Time spent: 35mins Greater than 50% of this time was spent in counseling, explanation of diagnosis, planning of further management, and coordination of care.   Voice Recognition Viviann Spare dictation system was used to create this note, attempts have been made to correct errors. Please contact the author with questions and/or clarifications.   Florencia Reasons, MD PhD FACP [facility]  Available via Epic secure chat 7am-7pm for nonurgent issues Please page for urgent issues To page the attending provider between 7A-7P or the covering provider during after hours 7P-7A, please log into the web site [email or web address] and access using universal [place] password for that web site. If you do not have the password, please call the hospital operator.    08/19/2021, 9:53 AM

## 2021-08-20 ENCOUNTER — Inpatient Hospital Stay (HOSPITAL_COMMUNITY): Payer: Medicare Other

## 2021-08-20 DIAGNOSIS — J09X1 Influenza due to identified novel influenza A virus with pneumonia: Secondary | ICD-10-CM

## 2021-08-20 DIAGNOSIS — N1330 Unspecified hydronephrosis: Secondary | ICD-10-CM

## 2021-08-20 LAB — BASIC METABOLIC PANEL
Anion gap: 8 (ref 5–15)
BUN: 24 mg/dL — ABNORMAL HIGH (ref 8–23)
CO2: 26 mmol/L (ref 22–32)
Calcium: 8.6 mg/dL — ABNORMAL LOW (ref 8.9–10.3)
Chloride: 106 mmol/L (ref 98–111)
Creatinine, Ser: 0.7 mg/dL (ref 0.44–1.00)
GFR, Estimated: >60 mL/min (ref >60)
Glucose, Bld: 80 mg/dL (ref 70–99)
Potassium: 3.7 mmol/L (ref 3.5–5.1)
Sodium: 140 mmol/L (ref 135–145)

## 2021-08-20 LAB — CBC WITH DIFFERENTIAL/PLATELET
Abs Immature Granulocytes: 0.7 10*3/uL — ABNORMAL HIGH (ref 0.00–0.07)
Basophils Absolute: 0 10*3/uL (ref 0.0–0.1)
Basophils Relative: 0 %
Eosinophils Absolute: 0 10*3/uL (ref 0.0–0.5)
Eosinophils Relative: 0 %
HCT: 29.7 % — ABNORMAL LOW (ref 36.0–46.0)
Hemoglobin: 9.7 g/dL — ABNORMAL LOW (ref 12.0–15.0)
Lymphocytes Relative: 2 %
Lymphs Abs: 0.3 10*3/uL — ABNORMAL LOW (ref 0.7–4.0)
MCH: 28.6 pg (ref 26.0–34.0)
MCHC: 32.7 g/dL (ref 30.0–36.0)
MCV: 87.6 fL (ref 80.0–100.0)
Metamyelocytes Relative: 4 %
Monocytes Absolute: 0.5 10*3/uL (ref 0.1–1.0)
Monocytes Relative: 3 %
Neutro Abs: 15.2 10*3/uL — ABNORMAL HIGH (ref 1.7–7.7)
Neutrophils Relative %: 91 %
Platelets: 376 10*3/uL (ref 150–400)
RBC: 3.39 MIL/uL — ABNORMAL LOW (ref 3.87–5.11)
RDW: 15 % (ref 11.5–15.5)
WBC: 16.7 10*3/uL — ABNORMAL HIGH (ref 4.0–10.5)
nRBC: 0 % (ref 0.0–0.2)
nRBC: 0 /100 WBC

## 2021-08-20 LAB — LEGIONELLA PNEUMOPHILA SEROGP 1 UR AG: L. pneumophila Serogp 1 Ur Ag: NEGATIVE

## 2021-08-20 LAB — HEMOGLOBIN A1C
Hgb A1c MFr Bld: 5.9 % — ABNORMAL HIGH (ref 4.8–5.6)
Mean Plasma Glucose: 122.63 mg/dL

## 2021-08-20 LAB — STREP PNEUMONIAE URINARY ANTIGEN: Strep Pneumo Urinary Antigen: NEGATIVE

## 2021-08-20 MED ORDER — SODIUM CHLORIDE 0.9 % IV SOLN
500.0000 mg | INTRAVENOUS | Status: AC
  Administered 2021-08-20 – 2021-08-25 (×6): 500 mg via INTRAVENOUS
  Filled 2021-08-20 (×7): qty 5

## 2021-08-20 MED ORDER — POTASSIUM CHLORIDE CRYS ER 20 MEQ PO TBCR
40.0000 meq | EXTENDED_RELEASE_TABLET | Freq: Once | ORAL | Status: AC
  Administered 2021-08-20: 40 meq via ORAL
  Filled 2021-08-20: qty 2

## 2021-08-20 MED ORDER — HYDROCODONE-ACETAMINOPHEN 5-325 MG PO TABS
1.0000 | ORAL_TABLET | Freq: Four times a day (QID) | ORAL | Status: DC | PRN
  Administered 2021-08-20 – 2021-08-25 (×6): 1 via ORAL
  Filled 2021-08-20 (×6): qty 1

## 2021-08-20 MED ORDER — METHOCARBAMOL 500 MG PO TABS: 500.0000 mg | ORAL_TABLET | Freq: Three times a day (TID) | ORAL | Status: DC | PRN

## 2021-08-20 MED ORDER — LIDOCAINE 5 % EX PTCH
1.0000 | MEDICATED_PATCH | CUTANEOUS | Status: DC
  Administered 2021-08-20 – 2021-08-25 (×6): 1 via TRANSDERMAL
  Filled 2021-08-20 (×6): qty 1

## 2021-08-20 MED ORDER — VANCOMYCIN HCL 750 MG/150ML IV SOLN
750.0000 mg | INTRAVENOUS | Status: DC
  Administered 2021-08-21: 750 mg via INTRAVENOUS
  Filled 2021-08-20 (×3): qty 150

## 2021-08-20 MED ORDER — BOOST / RESOURCE BREEZE PO LIQD CUSTOM
1.0000 | Freq: Three times a day (TID) | ORAL | Status: DC
  Administered 2021-08-22 (×3): 1 via ORAL

## 2021-08-20 MED ORDER — VANCOMYCIN HCL IN DEXTROSE 1-5 GM/200ML-% IV SOLN
1000.0000 mg | Freq: Once | INTRAVENOUS | Status: DC
  Filled 2021-08-20 (×2): qty 200

## 2021-08-20 MED ORDER — VANCOMYCIN HCL IN DEXTROSE 1-5 GM/200ML-% IV SOLN
1000.0000 mg | Freq: Once | INTRAVENOUS | Status: AC
  Administered 2021-08-20: 1000 mg via INTRAVENOUS
  Filled 2021-08-20: qty 200

## 2021-08-20 NOTE — Progress Notes (Signed)
  Transition of Care [facility]) Screening Note   Patient Details  Name: Tracy Park Date of Birth: 1956-03-31   Transition of Care [facility] CM/SW Contact:    Sharin Mons, RN Phone Number: [phone] 08/20/2021, 12:51 PM   Presents with Sepsis secondary to influenza A with pneumonia. Hx of  anxiety, depression, bipolar disorder, COPD, chronic hypoxic respiratory failure on 2 L home oxygen, hypothyroidism, lung cancer. Resides with brother, [name]. States supportive family. States PTA independent with ADL's.  Pt requiring oxygen 10L HFNC ( base line 2L) since last hs ,12/11.  Transition of Care Department [facility]) has reviewed patient and will continue to monitor  and assist  with  TOC needs....  Whitman Hero RN,BSN,CM

## 2021-08-20 NOTE — Progress Notes (Signed)
Pharmacy Antibiotic Note  Tracy Park is a 65 y.o. female admitted on 08/16/2021 with Influenza A and possible superimposed bacterial pneumonia. Day #4 Tamiflu and #5 antibiotics, begun 12/8 pm. Increased oxygen requirement, escalating antibiotics.  Pharmacy has been consulted for Vancomycin dosing.   Vancomycin 1gm IV x 1 given on 12/8, then stopped on 12/9 with negative MRSA PCR.  Rechecking MRSA PCR today, to resume Vancomycin for a few days, per discussion with Dr. Lupita Leash.  Cefepime > Ceftriaxone on 12/11.  Plan: Vancomycin 1gm IV x 1 today. Vancomycin 750 mg IV Q 24 hrs to begin 12/13. Goal AUC 400-550. Expected AUC: 485 SCr used: 0.8.  Weight is below IBW. Also adding Azithromycin 500 mg IV q24h per MD Continuing Ceftriaxone 2gm IV q24h. Tamilfu 30 mg PO BID x 5 days ends with 12/13 am dose. Follow renal function, MRSA PCR and other culture data, clinical progress and antibiotic plans.   Height: 5\' 3"  (160 cm) Weight: 45.4 kg (100 lb) IBW/kg (Calculated) : 52.4  Temp (24hrs), Avg:97.6 F (36.4 C), Min:97.3 F (36.3 C), Max:98 F (36.7 C)  Recent Labs  Lab 08/16/21 1836 08/17/21 0216 08/18/21 0207 08/19/21 0149 08/20/21 0235  WBC 7.0  --  10.5  --  16.7*  CREATININE 0.74 0.51 0.58 0.57 0.70  LATICACIDVEN 0.8 1.0  --   --   --     Estimated Creatinine Clearance: 51.6 mL/min (by C-G formula based on SCr of 0.7 mg/dL).    Allergies  Allergen Reactions   Red Dye Hives and Itching   Oxycontin [Oxycodone Hcl]     Other reaction(s): Hallucinations   Aspirin Hives   Tape Rash    ONLY USE PAPER TAPE    Antimicrobials this admission: Vancomycin 1gm load on 12/8>>dc'd 12/9, resumed 12/12>> Cefepime 12/8 >>12/11 Ceftriaxone 12/11>> Azithromycin IV 12/12>> Tamiflu 12/8 pm>>12/13 am  Dose adjustments this admission: n/a  Microbiology results: 12/9 Blood: no growth x 3 days to date 12/8 Blood: no growth x 4 days to date 12/8 MRSA PCR: neg 12/8 resp panel:  positive for Flu A  12/12 MRSA PCR: ordered   Thank you for allowing pharmacy to be a part of this patient's care.  Arty Baumgartner, [place] 08/20/2021 12:39 PM

## 2021-08-20 NOTE — Progress Notes (Signed)
PROGRESS NOTE    Tracy Park  GNF:[id number] DOB: 1956-06-27 DOA: 08/16/2021 PCP: Irving Shows, PA-C   Chief Complaint  Patient presents with   Code Sepsis  Brief Narrative/Hospital Course: Tracy Park, 65 y.o. female with PMH of COPD chronic hypoxia on 2 L nasal cannula, OSA, lung cancer status postchemotherapy and surgery 2 years ago recently diagnosed with flu like symptoms 12/3 positive for influenza chest x-ray no pneumonia discharged home returns to the ED on 12/8 with worsening shortness of breath cough fever and abdominal pain. On EMS saturating 87% on room air improved to 94% on 2 L febrile 100.7 tachycardic in 110 tachypneic in the mid to high 20s influenza A positive COVID-19 negative, chest x-ray emphysema and likely superimposed bilateral lower lobe infection/inflammation-she was admitted for sepsis with influenza and pneumonia along with COPD exacerbation acute on chronic hypoxic respiratory failure   Subjective: Seen and examined this morning complains of pain across the low back, has been needing more oxygen since last night on 10 L HFNC Overnight afebrile, WBC bumped to 16.7 from 10.5, Pro-Cal 0.6> 0.4 Denies chest pain.  Assessment & Plan:  Sepsis POA Influenza A with pneumonia Multifocal pneumonia: Sepsis in the setting of influenza A infection and with likely superimposed bacterial pneumonia chest x-ray this morning with progressive multifocal pneumonia, question postviral bacterial infection.  On ceftriaxone and will add azithromycin and vancomycin, add MRSA pcr, follow-up sputum culture urine antigen.  is needing more oxygen overnight.- will ask PCCm to see her. Continue supplemental oxygen antitussives  Acute on chronic hypoxic respiratory failure Acute COPD exacerbation: normally on 2 L nasal cannula this morning on 10 L HFNC.  In the setting of multifocal pneumonia, COPD exacerbation, continue IV steroid , Dulera , antitussives bronchodilators and  supplemental oxygen.  We will request pulmonary to see the patient  Low back pain: In the setting of pneumonia, will order x-ray of lumbar area, added hydrocodone, lidocaine patch, heat therapy.  Hyperglycemia A1c 5.9, prediabetes, on steroid, monitor CBG  Mild hyponatremia: Improved Hypokalemia/hypomagnesemia: Improved  Right flank pain/mild right-sided hydro ureteral nephrosis History of right ureteral stone: Seen by Dr. Tresa Moore from urology, unclear if chronic from prior stone or perhaps indicative of some very mild partial obstruction from stricture , continue Flomax, no intervention recommended follow-up with [facility] urology.  Consider repeat elective diagnostic ureteroscopy or nuclear medicine renogram to rule out significant obstruction if persist in outpatient setting  GERD continue PPI and H2 blocker HLD continue statin Hypothyroidism with history of thyroidectomy continue her home Synthroid  Anxiety/depression/bipolar disorder: Mood is stable, anxious, continue her  home meds  Nutrition Status: Nutrition Problem: Increased nutrient needs Etiology: acute illness (sepsis secondary to influenza A pneumonia) Signs/Symptoms: estimated needs Interventions: Ensure Enlive (each supplement provides 350kcal and 20 grams of protein), MVI, Liberalize Diet   DVT prophylaxis: enoxaparin (LOVENOX) injection 40 mg Start: 08/16/21 2315 Code Status:   Code Status: DNR Family Communication: plan of care discussed with patient at bedside. Status is: Inpatient Remains inpatient appropriate because: For ongoing management of pneumonia respiratory failure Disposition: Currently not medically stable for discharge. Anticipated Disposition: TBD   Objective: Vitals last 24 hrs: Vitals:   08/19/21 2044 08/20/21 0353 08/20/21 0529 08/20/21 0800  BP:  (!) 111/94  139/76  Pulse:  94  99  Resp:  20  18  Temp:  98 F (36.7 C)  (!) 97.3 F (36.3 C)  TempSrc:  Oral  Oral  SpO2: 93% 94% 92% 94%  Weight:      Height:       Weight change:   Intake/Output Summary (Last 24 hours) at 08/20/2021 0835 Last data filed at 08/19/2021 1830 Gross per 24 hour  Intake 820 ml  Output --  Net 820 ml   Net IO Since Admission: 3,956.12 mL [08/20/21 0835]   Physical Examination: General exam: AA0x3 in pain, anxious, weak,older than stated age. HEENT:Oral mucosa moist, Ear/Nose WNL grossly,dentition normal. Respiratory system: B/l diminished BS crackles on the left side, no use of accessory muscle, non tender. Cardiovascular system: S1 & S2 +,No JVD. Gastrointestinal system: Abdomen soft, NT,ND, BS+. Nervous System:Alert, awake, moving extremities. Extremities: edema none, distal peripheral pulses palpable.  Skin: No rashes, no icterus. MSK: Normal muscle bulk, tone, power.  Medications reviewed:  Scheduled Meds:  busPIRone  5 mg Oral TID   enoxaparin (LOVENOX) injection  40 mg Subcutaneous QHS   famotidine  40 mg Oral Daily   feeding supplement  237 mL Oral BID BM   guaiFENesin  600 mg Oral BID   levothyroxine  137 mcg Oral QAC breakfast   lidocaine  1 patch Transdermal Q24H   mometasone-formoterol  2 puff Inhalation BID   multivitamin with minerals  1 tablet Oral Daily   oseltamivir  30 mg Oral BID   oxybutynin  5 mg Oral TID   pantoprazole  40 mg Oral Daily   polyethylene glycol  17 g Oral BID   predniSONE  30 mg Oral Q breakfast   QUEtiapine  300 mg Oral QHS   rosuvastatin  20 mg Oral Daily   sertraline  100 mg Oral BID   tamsulosin  0.4 mg Oral Daily   venlafaxine XR  75 mg Oral Q breakfast   Continuous Infusions:  cefTRIAXone (ROCEPHIN)  IV 2 g (08/19/21 1645)    Diet Order             Diet regular Room service appropriate? Yes; Fluid consistency: Thin  Diet effective now                   Nutrition Problem: Increased nutrient needs Etiology: acute illness (sepsis secondary to influenza A pneumonia) Signs/Symptoms: estimated needs Interventions: Ensure  Enlive (each supplement provides 350kcal and 20 grams of protein), MVI, Liberalize Diet  Weight change:   Wt Readings from Last 3 Encounters:  08/16/21 45.4 kg  05/03/21 43.5 kg  04/09/20 (!) 40.8 kg     Consultants:see note  Procedures:see note Antimicrobials: Anti-infectives (From admission, onward)    Start     Dose/Rate Route Frequency Ordered Stop   08/19/21 1800  cefTRIAXone (ROCEPHIN) 2 g in sodium chloride 0.9 % 100 mL IVPB        2 g 200 mL/hr over 30 Minutes Intravenous Every 24 hours 08/19/21 1039     08/17/21 2000  vancomycin (VANCOREADY) IVPB 750 mg/150 mL  Status:  Discontinued        750 mg 150 mL/hr over 60 Minutes Intravenous Every 24 hours 08/16/21 2023 08/17/21 0857   08/17/21 0600  ceFEPIme (MAXIPIME) 2 g in sodium chloride 0.9 % 100 mL IVPB  Status:  Discontinued        2 g 200 mL/hr over 30 Minutes Intravenous Every 12 hours 08/16/21 2301 08/19/21 1039   08/16/21 2315  oseltamivir (TAMIFLU) capsule 30 mg        30 mg Oral 2 times daily 08/16/21 2302 08/21/21 2159   08/16/21 2300  oseltamivir (TAMIFLU) capsule 75 mg  Status:  Discontinued        75 mg Oral 2 times daily 08/16/21 2253 08/16/21 2302   08/16/21 2015  vancomycin (VANCOCIN) IVPB 1000 mg/200 mL premix        1,000 mg 200 mL/hr over 60 Minutes Intravenous  Once 08/16/21 2013 08/16/21 2202   08/16/21 1745  cefTRIAXone (ROCEPHIN) 2 g in sodium chloride 0.9 % 100 mL IVPB  Status:  Discontinued        2 g 200 mL/hr over 30 Minutes Intravenous Every 24 hours 08/16/21 1730 08/16/21 1734   08/16/21 1745  azithromycin (ZITHROMAX) 500 mg in sodium chloride 0.9 % 250 mL IVPB  Status:  Discontinued        500 mg 250 mL/hr over 60 Minutes Intravenous Every 24 hours 08/16/21 1730 08/16/21 1734   08/16/21 1745  ceFEPIme (MAXIPIME) 1 g in sodium chloride 0.9 % 100 mL IVPB  Status:  Discontinued        1 g 200 mL/hr over 30 Minutes Intravenous  Once 08/16/21 1734 08/16/21 1742   08/16/21 1745  ceFEPIme  (MAXIPIME) 2 g in sodium chloride 0.9 % 100 mL IVPB        2 g 200 mL/hr over 30 Minutes Intravenous  Once 08/16/21 1742 08/16/21 1926      Culture/Microbiology    Component Value Date/Time   SDES BLOOD RIGHT HAND 08/17/2021 0216   SPECREQUEST  08/17/2021 0216    BOTTLES DRAWN AEROBIC AND ANAEROBIC Blood Culture adequate volume   CULT  08/17/2021 0216    NO GROWTH 3 DAYS Performed at [facility], [place] [address] [place], [place] [address]    REPTSTATUS PENDING 08/17/2021 0216    Other culture-see note  Unresulted Labs (From admission, onward)     Start     Ordered   08/17/21 1745  Strep pneumoniae urinary antigen  Once,   R        08/17/21 1744   08/17/21 1745  Expectorated Sputum Assessment w Gram Stain, Rflx to Resp Cult  Once,   R        08/17/21 1744   08/16/21 2353  Legionella Pneumophila Serogp 1 Ur Ag  Once,   R        08/16/21 2353          Data Reviewed: I have personally reviewed following labs and imaging studies CBC: Recent Labs  Lab 08/16/21 1836 08/18/21 0207 08/20/21 0235  WBC 7.0 10.5 16.7*  NEUTROABS 5.6 10.1* 15.2*  HGB 12.1 10.7* 9.7*  HCT 35.8* 32.1* 29.7*  MCV 85.9 86.8 87.6  PLT 246 296 211   Basic Metabolic Panel: Recent Labs  Lab 08/16/21 1836 08/17/21 0216 08/18/21 0207 08/19/21 0149 08/20/21 0235  NA 132* 137 137 141 140  K 3.2* 4.0 3.7 3.5 3.7  CL 96* 104 102 106 106  CO2 26 26 26 25 26   GLUCOSE 110* 103* 156* 130* 80  BUN 13 12 9 16  24*  CREATININE 0.74 0.51 0.58 0.57 0.70  CALCIUM 8.3* 8.4* 8.2* 8.6* 8.6*  MG 1.6* 1.7 2.1 2.2  --    GFR: Estimated Creatinine Clearance: 51.6 mL/min (by C-G formula based on SCr of 0.7 mg/dL). Liver Function Tests: Recent Labs  Lab 08/16/21 1836  AST 20  ALT 21  ALKPHOS 58  BILITOT 0.7  PROT 6.3*  ALBUMIN 2.6*   Recent Labs  Lab 08/16/21 1836  LIPASE 25   No results for input(s): AMMONIA in the last  168 hours. Coagulation Profile: No results for input(s): INR,  PROTIME in the last 168 hours. Cardiac Enzymes: No results for input(s): CKTOTAL, CKMB, CKMBINDEX, TROPONINI in the last 168 hours. BNP (last 3 results) No results for input(s): PROBNP in the last 8760 hours. HbA1C: Recent Labs    08/20/21 0235  HGBA1C 5.9*   CBG: No results for input(s): GLUCAP in the last 168 hours. Lipid Profile: No results for input(s): CHOL, HDL, LDLCALC, TRIG, CHOLHDL, LDLDIRECT in the last 72 hours. Thyroid Function Tests: No results for input(s): TSH, T4TOTAL, FREET4, T3FREE, THYROIDAB in the last 72 hours. Anemia Panel: No results for input(s): VITAMINB12, FOLATE, FERRITIN, TIBC, IRON, RETICCTPCT in the last 72 hours. Sepsis Labs: Recent Labs  Lab 08/16/21 1836 08/17/21 0216 08/18/21 0207 08/19/21 0149  PROCALCITON  --  0.68 0.64 0.46  LATICACIDVEN 0.8 1.0  --   --     Recent Results (from the past 240 hour(s))  Resp Panel by RT-PCR (Flu A&B, Covid) Nasopharyngeal Swab     Status: Abnormal   Collection Time: 08/11/21  8:36 AM   Specimen: Nasopharyngeal Swab; Nasopharyngeal(NP) swabs in vial transport medium  Result Value Ref Range Status   SARS Coronavirus 2 by RT PCR NEGATIVE NEGATIVE Final    Comment: (NOTE) SARS-CoV-2 target nucleic acids are NOT DETECTED.  The SARS-CoV-2 RNA is generally detectable in upper respiratory specimens during the acute phase of infection. The lowest concentration of SARS-CoV-2 viral copies this assay can detect is 138 copies/mL. A negative result does not preclude SARS-Cov-2 infection and should not be used as the sole basis for treatment or other patient management decisions. A negative result may occur with  improper specimen collection/handling, submission of specimen other than nasopharyngeal swab, presence of viral mutation(s) within the areas targeted by this assay, and inadequate number of viral copies(<138 copies/mL). A negative result must be combined with clinical observations, patient history, and  epidemiological information. The expected result is Negative.  Fact Sheet for Patients:  EntrepreneurPulse.com.au  Fact Sheet for Healthcare Providers:  IncredibleEmployment.be  This test is no t yet approved or cleared by the [place] FDA and  has been authorized for detection and/or diagnosis of SARS-CoV-2 by FDA under an Emergency Use Authorization (EUA). This EUA will remain  in effect (meaning this test can be used) for the duration of the COVID-19 declaration under Section 564(b)(1) of the Act, 21 U.S.C.section 360bbb-3(b)(1), unless the authorization is terminated  or revoked sooner.       Influenza A by PCR POSITIVE (A) NEGATIVE Final   Influenza B by PCR NEGATIVE NEGATIVE Final    Comment: (NOTE) The Xpert Xpress SARS-CoV-2/FLU/RSV plus assay is intended as an aid in the diagnosis of influenza from Nasopharyngeal swab specimens and should not be used as a sole basis for treatment. Nasal washings and aspirates are unacceptable for Xpert Xpress SARS-CoV-2/FLU/RSV testing.  Fact Sheet for Patients: EntrepreneurPulse.com.au  Fact Sheet for Healthcare Providers: IncredibleEmployment.be  This test is not yet approved or cleared by the [place] FDA and has been authorized for detection and/or diagnosis of SARS-CoV-2 by FDA under an Emergency Use Authorization (EUA). This EUA will remain in effect (meaning this test can be used) for the duration of the COVID-19 declaration under Section 564(b)(1) of the Act, 21 U.S.C. section 360bbb-3(b)(1), unless the authorization is terminated or revoked.  Performed at [facility], [place] [address], [place], [place] [address]   Blood culture (routine x 2)     Status:  None (Preliminary result)   Collection Time: 08/16/21  5:37 PM   Specimen: BLOOD  Result Value Ref Range Status   Specimen Description BLOOD SITE NOT SPECIFIED  Final    Special Requests   Final    BOTTLES DRAWN AEROBIC AND ANAEROBIC Blood Culture adequate volume   Culture   Final    NO GROWTH 4 DAYS Performed at [facility], [address] [place], [place] [address]    Report Status PENDING  Incomplete  Resp Panel by RT-PCR (Flu A&B, Covid) Nasopharyngeal Swab     Status: Abnormal   Collection Time: 08/16/21  6:31 PM   Specimen: Nasopharyngeal Swab; Nasopharyngeal(NP) swabs in vial transport medium  Result Value Ref Range Status   SARS Coronavirus 2 by RT PCR NEGATIVE NEGATIVE Final    Comment: (NOTE) SARS-CoV-2 target nucleic acids are NOT DETECTED.  The SARS-CoV-2 RNA is generally detectable in upper respiratory specimens during the acute phase of infection. The lowest concentration of SARS-CoV-2 viral copies this assay can detect is 138 copies/mL. A negative result does not preclude SARS-Cov-2 infection and should not be used as the sole basis for treatment or other patient management decisions. A negative result may occur with  improper specimen collection/handling, submission of specimen other than nasopharyngeal swab, presence of viral mutation(s) within the areas targeted by this assay, and inadequate number of viral copies(<138 copies/mL). A negative result must be combined with clinical observations, patient history, and epidemiological information. The expected result is Negative.  Fact Sheet for Patients:  EntrepreneurPulse.com.au  Fact Sheet for Healthcare Providers:  IncredibleEmployment.be  This test is no t yet approved or cleared by the [place] FDA and  has been authorized for detection and/or diagnosis of SARS-CoV-2 by FDA under an Emergency Use Authorization (EUA). This EUA will remain  in effect (meaning this test can be used) for the duration of the COVID-19 declaration under Section 564(b)(1) of the Act, 21 U.S.C.section 360bbb-3(b)(1), unless the authorization is terminated   or revoked sooner.       Influenza A by PCR POSITIVE (A) NEGATIVE Final   Influenza B by PCR NEGATIVE NEGATIVE Final    Comment: (NOTE) The Xpert Xpress SARS-CoV-2/FLU/RSV plus assay is intended as an aid in the diagnosis of influenza from Nasopharyngeal swab specimens and should not be used as a sole basis for treatment. Nasal washings and aspirates are unacceptable for Xpert Xpress SARS-CoV-2/FLU/RSV testing.  Fact Sheet for Patients: EntrepreneurPulse.com.au  Fact Sheet for Healthcare Providers: IncredibleEmployment.be  This test is not yet approved or cleared by the [place] FDA and has been authorized for detection and/or diagnosis of SARS-CoV-2 by FDA under an Emergency Use Authorization (EUA). This EUA will remain in effect (meaning this test can be used) for the duration of the COVID-19 declaration under Section 564(b)(1) of the Act, 21 U.S.C. section 360bbb-3(b)(1), unless the authorization is terminated or revoked.  Performed at [facility], [place] [address], [place], [place] [address]   MRSA Next Gen by PCR, Nasal     Status: None   Collection Time: 08/16/21 10:52 PM   Specimen: Nasal Mucosa; Nasal Swab  Result Value Ref Range Status   MRSA by PCR Next Gen NOT DETECTED NOT DETECTED Final    Comment: (NOTE) The GeneXpert MRSA Assay (FDA approved for NASAL specimens only), is one component of a comprehensive MRSA colonization surveillance program. It is not intended to diagnose MRSA infection nor to guide or monitor treatment for MRSA infections. Test  performance is not FDA approved in patients less than 68 years old. Performed at [facility], [place] [address] [place], [place] [address]   Blood culture (routine x 2)     Status: None (Preliminary result)   Collection Time: 08/17/21  2:16 AM   Specimen: BLOOD RIGHT HAND  Result Value Ref Range Status   Specimen Description BLOOD RIGHT HAND  Final   Special  Requests   Final    BOTTLES DRAWN AEROBIC AND ANAEROBIC Blood Culture adequate volume   Culture   Final    NO GROWTH 3 DAYS Performed at [facility], [place] [address], [place], [place] [address]    Report Status PENDING  Incomplete     Radiology Studies: US RENAL  Result Date: 08/19/2021 CLINICAL DATA:  Hydronephrosis. EXAM: RENAL / URINARY TRACT ULTRASOUND COMPLETE COMPARISON:  CT AP, 08/16/2021 and 05/03/2021. US Abdomen, 05/03/2021. FINDINGS: Right Kidney: Renal measurements: 12.4 x 4.0 x 6.1 cm = volume: 160 mL. Echogenicity within normal limits. No mass is visualized. Mild-to-moderate volume of hydronephrosis is present. Left Kidney: Renal measurements: 11.8 x 4.0 x 4.4 cm = volume: 107 mL. Echogenicity within normal limits. No mass or hydronephrosis visualized. Bladder: Appears normal for degree of bladder distention. Other: No ascites IMPRESSION: 1. Mild-to-moderate volume RIGHT hydronephrosis. 2. Normal LEFT renal collecting system. Electronically Signed   By: Michaelle Birks M.D.   On: 08/19/2021 11:24     LOS: 4 days   Antonieta Pert, MD [facility]  08/20/2021, 8:35 AM

## 2021-08-20 NOTE — Consult Note (Signed)
NAME:  Tracy Park, MRN:  [id number], DOB:  12-09-1955, LOS: 4 ADMISSION DATE:  08/16/2021, CONSULTATION DATE:  08/20/21 REFERRING MD:  Lupita Leash, CHIEF COMPLAINT:  SOB   History of Present Illness:  65 year old woman with hx of O2-dependent COPD followed at [facility] by Dr. [name], 3a LUL lung cancer post chemo in remission, recurrent symptomatic kidney stones presenting with flu-like symptoms found to have influenza with acute on chronic hypoxemia.  Unfortunately despite appropriate supportive care O2 needs have increased.  PCCM consulted for help with management.  Currently patient's cough is paroxysmal, keeps her from sleep, and productive of yellow sputum.  She also has new back/flank pain with CT abd showing resolving R hydrouretonephrosis.  Urology does not necessarily think this is cause of pain.  Pertinent  Medical History  COPD Anxiety Bipolar Lung cancer PTSD Recurrent kidney stones  Significant Hospital Events: Including procedures, antibiotic start and stop dates in addition to other pertinent events   12/8 admitted  Interim History / Subjective:  Consulted, ROS as below.  Objective   Blood pressure 139/76, pulse 99, temperature (!) 97.3 F (36.3 C), temperature source Oral, resp. rate 18, height 5\' 3"  (1.6 m), weight 45.4 kg, SpO2 97 %.        Intake/Output Summary (Last 24 hours) at 08/20/2021 1213 Last data filed at 08/20/2021 0800 Gross per 24 hour  Intake 460 ml  Output 350 ml  Net 110 ml   Filed Weights   08/16/21 1737  Weight: 45.4 kg    Examination: General: chronically ill thin woman lying in bed HENT: MM extremely dry, trachea midline, +temporal wasting Lungs: diminished bilaterally with prolonged expiration, no accessory muscle use Cardiovascular: RRR, ext warm Abdomen: soft, +BS Extremities: advanced arthritic changes, no edema, +muscle wasting Neuro: moves all 4 ext Psych: anxious  CXR with worsening RUL and LLL infiltrate; chronic L sided  volume loss   Resolved Hospital Problem list    Assessment & Plan:  AOC hypoxemic respiratory failure due to multifocal pneumonia- flu positive, question of superimposed CAP, currently on 10LPM [place] satting in high 90s, suspicion she desats with her cough paroxysms. AECOPD from above on appropriate therapy Underlying advanced COPD on 2-4L HOT Severe protein calorie malnutrition- POA, not eating here, willing to try boost Back/flank pain, hx recurrent kidney stones- non pleuritic, constant, unclear cause, started after she was admitted, urology does not think renal colic  - Agree with abx, steroids, nebs as ordered - Check LE duplex, pretty low suspicion for VTE given CXR appearance and clinic hx - Encourage IS - Encourage PO - Suspect you can be more aggressive weaning O2 if you allow her time to recover sats in between coughing spells - Will follow with you  Best Practice (right click and "Reselect all SmartList Selections" daily)   Per primary  Labs   CBC: Recent Labs  Lab 08/16/21 1836 08/18/21 0207 08/20/21 0235  WBC 7.0 10.5 16.7*  NEUTROABS 5.6 10.1* 15.2*  HGB 12.1 10.7* 9.7*  HCT 35.8* 32.1* 29.7*  MCV 85.9 86.8 87.6  PLT 246 296 440    Basic Metabolic Panel: Recent Labs  Lab 08/16/21 1836 08/17/21 0216 08/18/21 0207 08/19/21 0149 08/20/21 0235  NA 132* 137 137 141 140  K 3.2* 4.0 3.7 3.5 3.7  CL 96* 104 102 106 106  CO2 26 26 26 25 26   GLUCOSE 110* 103* 156* 130* 80  BUN 13 12 9 16  24*  CREATININE 0.74 0.51 0.58 0.57 0.70  CALCIUM 8.3* 8.4* 8.2* 8.6* 8.6*  MG 1.6* 1.7 2.1 2.2  --    GFR: Estimated Creatinine Clearance: 51.6 mL/min (by C-G formula based on SCr of 0.7 mg/dL). Recent Labs  Lab 08/16/21 1836 08/17/21 0216 08/18/21 0207 08/19/21 0149 08/20/21 0235  PROCALCITON  --  0.68 0.64 0.46  --   WBC 7.0  --  10.5  --  16.7*  LATICACIDVEN 0.8 1.0  --   --   --     Liver Function Tests: Recent Labs  Lab 08/16/21 1836  AST 20  ALT 21   ALKPHOS 58  BILITOT 0.7  PROT 6.3*  ALBUMIN 2.6*   Recent Labs  Lab 08/16/21 1836  LIPASE 25   No results for input(s): AMMONIA in the last 168 hours.  ABG    Component Value Date/Time   PHART 7.410 05/03/2021 1029   PCO2ART 44.9 05/03/2021 1029   PO2ART 43 (L) 05/03/2021 1029   HCO3 28.5 (H) 05/03/2021 1029   TCO2 30 05/03/2021 1029   O2SAT 79.5 05/03/2021 1040     Coagulation Profile: No results for input(s): INR, PROTIME in the last 168 hours.  Cardiac Enzymes: No results for input(s): CKTOTAL, CKMB, CKMBINDEX, TROPONINI in the last 168 hours.  HbA1C: Hgb A1c MFr Bld  Date/Time Value Ref Range Status  08/20/2021 02:35 AM 5.9 (H) 4.8 - 5.6 % Final    Comment:    (NOTE) Pre diabetes:          5.7%-6.4%  Diabetes:              >6.4%  Glycemic control for   <7.0% adults with diabetes     CBG: No results for input(s): GLUCAP in the last 168 hours.  Review of Systems:    Positive Symptoms in bold:  Constitutional fevers, chills, weight loss, fatigue, anorexia, malaise  Eyes decreased vision, double vision, eye irritation  Ears, Nose, Mouth, Throat sore throat, trouble swallowing, sinus congestion  Cardiovascular chest pain, paroxysmal nocturnal dyspnea, lower ext edema, palpitations   Respiratory SOB, cough, DOE, hemoptysis, wheezing  Gastrointestinal nausea, vomiting, diarrhea  Genitourinary burning with urination, trouble urinating  Musculoskeletal joint aches, joint swelling, back pain  Integumentary  rashes, skin lesions  Neurological focal weakness, focal numbness, trouble speaking, headaches  Psychiatric depression, anxiety, confusion  Endocrine polyuria, polydipsia, cold intolerance, heat intolerance  Hematologic abnormal bruising, abnormal bleeding, unexplained nose bleeds  Allergic/Immunologic recurrent infections, hives, swollen lymph nodes     Past Medical History:  She,  has a past medical history of Anginal pain ([place]), Anxiety, Bipolar  disorder ([place]), Cancer ([place]), COPD (chronic obstructive pulmonary disease) ([place]), Dyspnea, Family history of adverse reaction to anesthesia, History of kidney stones, Hydroureteronephrosis (08/16/2021), Hypothyroidism, Lung cancer ([place]), Myocardial infarction ([place]), PTSD (post-traumatic stress disorder), Sleep apnea, and Thyroid disease.   Surgical History:   Past Surgical History:  Procedure Laterality Date   ABDOMINAL HYSTERECTOMY     BACK SURGERY     CYSTOSCOPY W/ URETERAL STENT PLACEMENT Right 05/03/2021   Procedure: CYSTOSCOPY WITH RETROGRADE PYELOGRAM/URETERAL STENT PLACEMENT;  Surgeon: Ardis Hughs, MD;  Location: [facility];  Service: Urology;  Laterality: Right;   EYE SURGERY     kidney stent     thyroidectomy       Social History:   reports that she has been smoking cigarettes. She has never used smokeless tobacco. She reports that she does not currently use alcohol. She reports that she does not currently use drugs.  Family History:  Her Family history is unknown by patient.   Allergies Allergies  Allergen Reactions   Red Dye Hives and Itching   Oxycontin [Oxycodone Hcl]     Other reaction(s): Hallucinations   Aspirin Hives   Tape Rash    ONLY USE PAPER TAPE     Home Medications  Prior to Admission medications   Medication Sig Start Date End Date Taking? Authorizing Provider  albuterol (PROVENTIL) (2.5 MG/3ML) 0.083% nebulizer solution Take 2.5 mg by nebulization every 6 (six) hours as needed for wheezing or shortness of breath.   Yes [provider]  albuterol (VENTOLIN HFA) 108 (90 Base) MCG/ACT inhaler Inhale 2 puffs into the lungs every 6 (six) hours as needed for wheezing or shortness of breath.  03/23/20  Yes [provider]  budesonide-formoterol (SYMBICORT) 160-4.5 MCG/ACT inhaler Inhale 1 puff into the lungs in the morning and at bedtime. 07/11/21  Yes [provider]  busPIRone (BUSPAR) 5 MG tablet Take 5 mg by mouth 3 (three)  times daily.   Yes [provider]  Cholecalciferol 25 MCG (1000 UT) tablet Take 1,000 Units by mouth daily.   Yes [provider]  desvenlafaxine (PRISTIQ) 50 MG 24 hr tablet Take 50 mg by mouth daily.   Yes [provider]  famotidine (PEPCID) 40 MG tablet Take 40 mg by mouth daily. 02/14/20  Yes [provider]  levothyroxine (SYNTHROID) 137 MCG tablet Take 137 mcg by mouth daily before breakfast. 03/14/20  Yes [provider]  meloxicam (MOBIC) 15 MG tablet Take 15 mg by mouth daily. 04/07/20  Yes [provider]  methocarbamol (ROBAXIN) 500 MG tablet Take 500 mg by mouth daily. 02/10/20  Yes [provider]  Multiple Vitamins-Minerals (CENTRUM WOMEN) TABS Take 1 tablet by mouth daily.   Yes [provider]  nitroGLYCERIN (NITROSTAT) 0.4 MG SL tablet Place 0.4 mg under the tongue every 5 (five) minutes as needed for chest pain.   Yes [provider]  omeprazole (PRILOSEC) 20 MG capsule Take 40 mg by mouth daily.   Yes [provider]  ondansetron (ZOFRAN) 4 MG tablet Take 4 mg by mouth daily. 03/10/20  Yes [provider]  oxybutynin (DITROPAN) 5 MG tablet Take 5 mg by mouth 3 (three) times daily. 03/15/20  Yes [provider]  OXYGEN Inhale 2 L into the lungs continuous.   Yes [provider]  QUEtiapine (SEROQUEL) 300 MG tablet Take 300 mg by mouth at bedtime.   Yes [provider]  rosuvastatin (CRESTOR) 20 MG tablet Take 20 mg by mouth daily. 01/15/20  Yes [provider]  senna (SENOKOT) 8.6 MG TABS tablet Take 1 tablet by mouth 2 (two) times daily as needed for mild constipation.   Yes [provider]  sertraline (ZOLOFT) 100 MG tablet Take 100 mg by mouth in the morning and at bedtime.   Yes [provider]  tamsulosin (FLOMAX) 0.4 MG CAPS capsule Take 0.4 mg by mouth daily. 03/21/20  Yes [provider]

## 2021-08-21 ENCOUNTER — Inpatient Hospital Stay (HOSPITAL_COMMUNITY): Payer: Medicare Other

## 2021-08-21 DIAGNOSIS — R609 Edema, unspecified: Secondary | ICD-10-CM

## 2021-08-21 LAB — BASIC METABOLIC PANEL
Anion gap: 8 (ref 5–15)
BUN: 15 mg/dL (ref 8–23)
CO2: 27 mmol/L (ref 22–32)
Calcium: 8.6 mg/dL — ABNORMAL LOW (ref 8.9–10.3)
Chloride: 103 mmol/L (ref 98–111)
Creatinine, Ser: 0.58 mg/dL (ref 0.44–1.00)
GFR, Estimated: >60 mL/min (ref >60)
Glucose, Bld: 72 mg/dL (ref 70–99)
Potassium: 3.9 mmol/L (ref 3.5–5.1)
Sodium: 138 mmol/L (ref 135–145)

## 2021-08-21 LAB — CBC
HCT: 30.9 % — ABNORMAL LOW (ref 36.0–46.0)
Hemoglobin: 10.1 g/dL — ABNORMAL LOW (ref 12.0–15.0)
MCH: 28.3 pg (ref 26.0–34.0)
MCHC: 32.7 g/dL (ref 30.0–36.0)
MCV: 86.6 fL (ref 80.0–100.0)
Platelets: 395 10*3/uL (ref 150–400)
RBC: 3.57 MIL/uL — ABNORMAL LOW (ref 3.87–5.11)
RDW: 15.2 % (ref 11.5–15.5)
WBC: 14 10*3/uL — ABNORMAL HIGH (ref 4.0–10.5)
nRBC: 0 % (ref 0.0–0.2)

## 2021-08-21 LAB — CULTURE, BLOOD (ROUTINE X 2)
Culture: NO GROWTH
Special Requests: ADEQUATE

## 2021-08-21 MED ORDER — ONDANSETRON HCL 4 MG/2ML IJ SOLN
4.0000 mg | Freq: Four times a day (QID) | INTRAMUSCULAR | Status: DC | PRN
  Filled 2021-08-21: qty 2

## 2021-08-21 MED ORDER — HYDROCODONE BIT-HOMATROP MBR 5-1.5 MG/5ML PO SOLN
5.0000 mL | Freq: Four times a day (QID) | ORAL | Status: DC | PRN
  Administered 2021-08-21 – 2021-08-22 (×2): 5 mL via ORAL
  Filled 2021-08-21 (×2): qty 5

## 2021-08-21 MED ORDER — ENOXAPARIN SODIUM 40 MG/0.4ML IJ SOSY
40.0000 mg | PREFILLED_SYRINGE | Freq: Every day | INTRAMUSCULAR | Status: DC
  Administered 2021-08-22: 09:00:00 40 mg via SUBCUTANEOUS
  Filled 2021-08-21: qty 0.4

## 2021-08-21 NOTE — Progress Notes (Signed)
Lower extremity venous has been completed.   Preliminary results in CV Proc.   Jinny Blossom Kaizlee Carlino 08/21/2021 10:29 AM

## 2021-08-21 NOTE — Progress Notes (Signed)
NAME:  Tracy Park, MRN:  [id number], DOB:  [birth year], LOS: 5 ADMISSION DATE:  08/16/2021, CONSULTATION DATE:  08/20/21 REFERRING MD:  Lupita Leash, CHIEF COMPLAINT:  SOB   History of Present Illness:  65 year old woman with hx of O2-dependent COPD followed at [facility] by Dr. [name], 3a LUL lung cancer post chemo in remission, recurrent symptomatic kidney stones presenting with flu-like symptoms found to have influenza with acute on chronic hypoxemia.  Unfortunately despite appropriate supportive care O2 needs have increased.  PCCM consulted for help with management.  Currently patient's cough is paroxysmal, keeps her from sleep, and productive of yellow sputum.  She also has new back/flank pain with CT abd showing resolving R hydrouretonephrosis.  Urology does not necessarily think this is cause of pain.  Pertinent  Medical History  COPD Anxiety Bipolar Lung cancer PTSD Recurrent kidney stones  Significant Hospital Events: Including procedures, antibiotic start and stop dates in addition to other pertinent events   12/8 admitted  Interim History / Subjective:  No acute events over night.   Objective   Blood pressure 135/80, pulse 93, temperature 98.3 F (36.8 C), temperature source Oral, resp. rate 17, height 5\' 3"  (1.6 m), weight 45.4 kg, SpO2 94 %.        Intake/Output Summary (Last 24 hours) at 08/21/2021 1235 Last data filed at 08/21/2021 0900 Gross per 24 hour  Intake 360 ml  Output --  Net 360 ml   Filed Weights   08/16/21 1737  Weight: 45.4 kg    Examination: General: chronically ill thin woman lying in bed HENT: MM extremely dry, trachea midline, +temporal wasting Lungs: diminished bilaterally with prolonged expiration, inspiratory chirps, no accessory muscle use Cardiovascular: RRR, ext warm Abdomen: soft, +BS Extremities: advanced arthritic changes, no edema, +muscle wasting Neuro: moves all 4 ext Psych: anxious  Resolved Hospital Problem list    Assessment  & Plan:  AOC hypoxemic respiratory failure due to multifocal pneumonia- flu positive, question of superimposed CAP AECOPD  Underlying advanced COPD on 2-4L HOT Severe protein calorie malnutrition- POA, not eating here, willing to try boost Back/flank pain, hx recurrent kidney stones- non pleuritic, constant, unclear cause, started after she was admitted, urology does not think renal colic  - Agree with abx, steroids, nebs as ordered. Can stop vancomycin as MRSA screen is negative. - Will take time for her O2 needs to improve as her remaining working area of lung is acutely impacted by pneumonia - LE duplex negative - Encourage IS - Encourage PO - Suspect you can be more aggressive weaning O2 if you allow her time to recover sats in between coughing spells  PCCM will sign off.   Best Practice (right click and "Reselect all SmartList Selections" daily)   Per primary  Labs   CBC: Recent Labs  Lab 08/16/21 1836 08/18/21 0207 08/20/21 0235 08/21/21 0331  WBC 7.0 10.5 16.7* 14.0*  NEUTROABS 5.6 10.1* 15.2*  --   HGB 12.1 10.7* 9.7* 10.1*  HCT 35.8* 32.1* 29.7* 30.9*  MCV 85.9 86.8 87.6 86.6  PLT 246 296 376 017    Basic Metabolic Panel: Recent Labs  Lab 08/16/21 1836 08/17/21 0216 08/18/21 0207 08/19/21 0149 08/20/21 0235 08/21/21 0331  NA 132* 137 137 141 140 138  K 3.2* 4.0 3.7 3.5 3.7 3.9  CL 96* 104 102 106 106 103  CO2 26 26 26 25 26 27   GLUCOSE 110* 103* 156* 130* 80 72  BUN 13 12 9 16  24* 15  CREATININE 0.74 0.51 0.58 0.57 0.70 0.58  CALCIUM 8.3* 8.4* 8.2* 8.6* 8.6* 8.6*  MG 1.6* 1.7 2.1 2.2  --   --    GFR: Estimated Creatinine Clearance: 51.6 mL/min (by C-G formula based on SCr of 0.58 mg/dL). Recent Labs  Lab 08/16/21 1836 08/17/21 0216 08/18/21 0207 08/19/21 0149 08/20/21 0235 08/21/21 0331  PROCALCITON  --  0.68 0.64 0.46  --   --   WBC 7.0  --  10.5  --  16.7* 14.0*  LATICACIDVEN 0.8 1.0  --   --   --   --     Liver Function Tests: Recent  Labs  Lab 08/16/21 1836  AST 20  ALT 21  ALKPHOS 58  BILITOT 0.7  PROT 6.3*  ALBUMIN 2.6*   Recent Labs  Lab 08/16/21 1836  LIPASE 25   No results for input(s): AMMONIA in the last 168 hours.  ABG    Component Value Date/Time   PHART 7.410 05/03/2021 1029   PCO2ART 44.9 05/03/2021 1029   PO2ART 43 (L) 05/03/2021 1029   HCO3 28.5 (H) 05/03/2021 1029   TCO2 30 05/03/2021 1029   O2SAT 79.5 05/03/2021 1040     Coagulation Profile: No results for input(s): INR, PROTIME in the last 168 hours.  Cardiac Enzymes: No results for input(s): CKTOTAL, CKMB, CKMBINDEX, TROPONINI in the last 168 hours.  HbA1C: Hgb A1c MFr Bld  Date/Time Value Ref Range Status  08/20/2021 02:35 AM 5.9 (H) 4.8 - 5.6 % Final    Comment:    (NOTE) Pre diabetes:          5.7%-6.4%  Diabetes:              >6.4%  Glycemic control for   <7.0% adults with diabetes     CBG: No results for input(s): GLUCAP in the last 168 hours.     Freda Jackson, MD [facility] Office: [phone]   See Amion for personal pager PCCM on call pager [phone] until 7pm. Please call Elink 7p-7a. [phone]

## 2021-08-21 NOTE — Progress Notes (Signed)
1425 - RN was made aware that pt has been refusing her HS Lovenox dosage. RN went in to speak with the pt and educate her on the importance of the Lovenox as a part of her treatment. Pt was adamant in her refusal stating that she knows what it is used for but doesn't want it because it hurts too much. Dr. Maren Beach was informed of pt's declaration at this time. Pt also stated that she would rather have the "leg wraps" (SCD) which were added to her care.

## 2021-08-21 NOTE — Care Management Important Message (Signed)
Important Message  Patient Details  Name: Tracy Park MRN: [id number] Date of Birth: [birth year]   Medicare Important Message Given:  Yes     Hannah Beat 08/21/2021, 1:51 PM

## 2021-08-21 NOTE — Plan of Care (Signed)

## 2021-08-21 NOTE — Progress Notes (Signed)
PROGRESS NOTE    Tracy Park  UYQ:[id number] DOB: 08-01-1956 DOA: 08/16/2021 PCP: Irving Shows, PA-C   Chief Complaint  Patient presents with   Code Sepsis  Brief Narrative/Hospital Course: Tracy Park, 65 y.o. female with PMH of COPD chronic hypoxia on 2 L nasal cannula, OSA, lung cancer status postchemotherapy and surgery 2 years ago recently diagnosed with flu like symptoms 12/3 positive for influenza chest x-ray no pneumonia discharged home returns to the ED on 12/8 with worsening shortness of breath cough fever and abdominal pain. On EMS saturating 87% on room air improved to 94% on 2 L febrile 100.7 tachycardic in 110 tachypneic in the mid to high 20s influenza A positive COVID-19 negative, chest x-ray emphysema and likely superimposed bilateral lower lobe infection/inflammation-she was admitted for sepsis with influenza and pneumonia along with COPD exacerbation acute on chronic hypoxic respiratory failure. Patient has been on high flow nasal cannula since 12/11 night and is being managed with broad-spectrum antibiotics, pulmonary toileting, steroids.   Subjective: Seen and examined this morning.  Patient is having bouts of cough and became dyspneic with it.  She was very dyspneic this morning on moving from bed to the bedside commode Afebrile overnight. Still on HFNC  Labs with downtrending WBC count renal function stable  Assessment & Plan:  Sepsis POA Influenza A with pneumonia Multifocal pneumonia: Sepsis in the setting of influenza A infection and with likely superimposed bacterial pneumonia chest x-ray 12/12 with progressive multifocal pneumonia, possible postviral bacterial infection.  Continue current broad-spectrum antibiotics with ceftriaxone azithromycin and vancomycin. her WBC count improving.  Respiratory status is tenuous still on HFNC wean as tolerated.  Having persistent cough, will add Hycodan, continue Tessalon, bronchodilators. Follow-up culture data.   Send sputum for culture if available.  Acute on chronic hypoxic respiratory failure Acute COPD exacerbation: normally on 2 L [place] - Respiratory status is tenuous still on HFNC10 L , 2/2 multifocal pneumonia, COPD exacerbation, continue steroid , Dulera , antitussives bronchodilators and supplemental oxygen.  Wean oxygen as tolerated  Low back pain: In the setting of pneumonia/deconditioning/influenza, x-ray no acute finding on lumbar spine.  Continue pain control with hydrocodone, lidocaine patch, heat therapy.  Hyperglycemia A1c 5.9, prediabetes, on steroid, monitor CBG  Mild hyponatremia: Improved Hypokalemia/hypomagnesemia: Improved  Right flank pain/mild right-sided hydro ureteral nephrosis History of right ureteral stone: Seen by Dr. Tresa Moore from urology, unclear if chronic from prior stone or perhaps indicative of some very mild partial obstruction from stricture , continue Flomax, no intervention recommended follow-up with [facility] urology.  Consider repeat elective diagnostic ureteroscopy or nuclear medicine renogram to rule out significant obstruction if persist in outpatient setting  GERD stable on PPI and H2 blocker HLD on home statin Hypothyroidism with history of thyroidectomy continue her home Synthroid  Anxiety/depression/bipolar disorder: Mood appears stable  continue her  home meds  Nutrition Status: Nutrition Problem: Increased nutrient needs Etiology: acute illness (sepsis secondary to influenza A pneumonia) Signs/Symptoms: estimated needs Interventions: Ensure Enlive (each supplement provides 350kcal and 20 grams of protein), MVI, Liberalize Diet   DVT prophylaxis: enoxaparin (LOVENOX) injection 40 mg Start: 08/16/21 2315 Code Status:   Code Status: DNR Family Communication: plan of care discussed with patient at bedside. Status is: Inpatient Remains inpatient appropriate because: For ongoing management of pneumonia respiratory failure Disposition: Currently not  medically stable for discharge. Anticipated Disposition: TBD  Objective: Vitals last 24 hrs: Vitals:   08/20/21 2013 08/20/21 2216 08/21/21 0531 08/21/21 0741  BP:  [name])  142/87 140/86 135/80  Pulse: 85 (!) 106 100 93  Resp: (!) 22 18 18 17   Temp:  (!) 97.5 F (36.4 C) 97.6 F (36.4 C) 98.3 F (36.8 C)  TempSrc:  Oral Oral Oral  SpO2: 95% 91% 91% 96%  Weight:      Height:       Weight change:   Intake/Output Summary (Last 24 hours) at 08/21/2021 0743 Last data filed at 08/20/2021 1806 Gross per 24 hour  Intake 0 ml  Output 350 ml  Net -350 ml    Net IO Since Admission: 3,606.12 mL [08/21/21 0743]   Physical Examination: General exam: Alert awake dyspneic and short of breath, thin frail appears uncomfortable  HEENT:Oral mucosa moist, Ear/Nose WNL grossly, dentition normal. Respiratory system: bilaterally diminished breath sounds, no use of accessory muscle Cardiovascular system: S1 & S2 +, No JVD,. Gastrointestinal system: Abdomen soft, NT,ND, BS+ Nervous System:Alert, awake, moving extremities and grossly nonfocal Extremities: no edema, distal peripheral pulses palpable.  Skin: No rashes,no icterus. MSK: Normal muscle bulk,tone, power   Medications reviewed:  Scheduled Meds:  busPIRone  5 mg Oral TID   enoxaparin (LOVENOX) injection  40 mg Subcutaneous QHS   famotidine  40 mg Oral Daily   feeding supplement  1 Container Oral TID BM   guaiFENesin  600 mg Oral BID   levothyroxine  137 mcg Oral QAC breakfast   lidocaine  1 patch Transdermal Q24H   mometasone-formoterol  2 puff Inhalation BID   multivitamin with minerals  1 tablet Oral Daily   oseltamivir  30 mg Oral BID   oxybutynin  5 mg Oral TID   pantoprazole  40 mg Oral Daily   polyethylene glycol  17 g Oral BID   predniSONE  30 mg Oral Q breakfast   QUEtiapine  300 mg Oral QHS   rosuvastatin  20 mg Oral Daily   sertraline  100 mg Oral BID   tamsulosin  0.4 mg Oral Daily   venlafaxine XR  75 mg Oral Q  breakfast   Continuous Infusions:  azithromycin Stopped (08/20/21 1819)   cefTRIAXone (ROCEPHIN)  IV 2 g (08/20/21 1806)   vancomycin      Diet Order             Diet regular Room service appropriate? Yes; Fluid consistency: Thin  Diet effective now                   Nutrition Problem: Increased nutrient needs Etiology: acute illness (sepsis secondary to influenza A pneumonia) Signs/Symptoms: estimated needs Interventions: Ensure Enlive (each supplement provides 350kcal and 20 grams of protein), MVI, Liberalize Diet  Weight change:   Wt Readings from Last 3 Encounters:  08/16/21 45.4 kg  05/03/21 43.5 kg  04/09/20 (!) 40.8 kg     Consultants:see note  Procedures:see note Antimicrobials: Anti-infectives (From admission, onward)    Start     Dose/Rate Route Frequency Ordered Stop   08/21/21 2030  vancomycin (VANCOREADY) IVPB 750 mg/150 mL        750 mg 150 mL/hr over 60 Minutes Intravenous Every 24 hours 08/20/21 1210     08/20/21 2030  vancomycin (VANCOCIN) IVPB 1000 mg/200 mL premix        1,000 mg 200 mL/hr over 60 Minutes Intravenous  Once 08/20/21 1932 08/20/21 2306   08/20/21 1300  vancomycin (VANCOCIN) IVPB 1000 mg/200 mL premix  Status:  Discontinued        1,000 mg 200 mL/hr over 60  Minutes Intravenous  Once 08/20/21 1210 08/20/21 1932   08/20/21 1100  azithromycin (ZITHROMAX) 500 mg in sodium chloride 0.9 % 250 mL IVPB        500 mg 250 mL/hr over 60 Minutes Intravenous Every 24 hours 08/20/21 1005     08/19/21 1800  cefTRIAXone (ROCEPHIN) 2 g in sodium chloride 0.9 % 100 mL IVPB        2 g 200 mL/hr over 30 Minutes Intravenous Every 24 hours 08/19/21 1039     08/17/21 2000  vancomycin (VANCOREADY) IVPB 750 mg/150 mL  Status:  Discontinued        750 mg 150 mL/hr over 60 Minutes Intravenous Every 24 hours 08/16/21 2023 08/17/21 0857   08/17/21 0600  ceFEPIme (MAXIPIME) 2 g in sodium chloride 0.9 % 100 mL IVPB  Status:  Discontinued        2 g 200 mL/hr  over 30 Minutes Intravenous Every 12 hours 08/16/21 2301 08/19/21 1039   08/16/21 2315  oseltamivir (TAMIFLU) capsule 30 mg        30 mg Oral 2 times daily 08/16/21 2302 08/21/21 2159   08/16/21 2300  oseltamivir (TAMIFLU) capsule 75 mg  Status:  Discontinued        75 mg Oral 2 times daily 08/16/21 2253 08/16/21 2302   08/16/21 2015  vancomycin (VANCOCIN) IVPB 1000 mg/200 mL premix        1,000 mg 200 mL/hr over 60 Minutes Intravenous  Once 08/16/21 2013 08/16/21 2202   08/16/21 1745  cefTRIAXone (ROCEPHIN) 2 g in sodium chloride 0.9 % 100 mL IVPB  Status:  Discontinued        2 g 200 mL/hr over 30 Minutes Intravenous Every 24 hours 08/16/21 1730 08/16/21 1734   08/16/21 1745  azithromycin (ZITHROMAX) 500 mg in sodium chloride 0.9 % 250 mL IVPB  Status:  Discontinued        500 mg 250 mL/hr over 60 Minutes Intravenous Every 24 hours 08/16/21 1730 08/16/21 1734   08/16/21 1745  ceFEPIme (MAXIPIME) 1 g in sodium chloride 0.9 % 100 mL IVPB  Status:  Discontinued        1 g 200 mL/hr over 30 Minutes Intravenous  Once 08/16/21 1734 08/16/21 1742   08/16/21 1745  ceFEPIme (MAXIPIME) 2 g in sodium chloride 0.9 % 100 mL IVPB        2 g 200 mL/hr over 30 Minutes Intravenous  Once 08/16/21 1742 08/16/21 1926      Culture/Microbiology    Component Value Date/Time   SDES BLOOD RIGHT HAND 08/17/2021 0216   SPECREQUEST  08/17/2021 0216    BOTTLES DRAWN AEROBIC AND ANAEROBIC Blood Culture adequate volume   CULT  08/17/2021 0216    NO GROWTH 3 DAYS Performed at [facility], [place] [address], [place], [place] [address]    REPTSTATUS PENDING 08/17/2021 0216    Other culture-see note  Unresulted Labs (From admission, onward)     Start     Ordered   08/21/21 0500  CBC  Daily,   R      08/20/21 1105   08/17/21 1745  Expectorated Sputum Assessment w Gram Stain, Rflx to Resp Cult  Once,   R        08/17/21 1744          Data Reviewed: I have personally reviewed following labs and  imaging studies CBC: Recent Labs  Lab 08/16/21 1836 08/18/21 0207 08/20/21 0235 08/21/21 0331  WBC  7.0 10.5 16.7* 14.0*  NEUTROABS 5.6 10.1* 15.2*  --   HGB 12.1 10.7* 9.7* 10.1*  HCT 35.8* 32.1* 29.7* 30.9*  MCV 85.9 86.8 87.6 86.6  PLT 246 296 376 213    Basic Metabolic Panel: Recent Labs  Lab 08/16/21 1836 08/17/21 0216 08/18/21 0207 08/19/21 0149 08/20/21 0235 08/21/21 0331  NA 132* 137 137 141 140 138  K 3.2* 4.0 3.7 3.5 3.7 3.9  CL 96* 104 102 106 106 103  CO2 26 26 26 25 26 27   GLUCOSE 110* 103* 156* 130* 80 72  BUN 13 12 9 16  24* 15  CREATININE 0.74 0.51 0.58 0.57 0.70 0.58  CALCIUM 8.3* 8.4* 8.2* 8.6* 8.6* 8.6*  MG 1.6* 1.7 2.1 2.2  --   --     GFR: Estimated Creatinine Clearance: 51.6 mL/min (by C-G formula based on SCr of 0.58 mg/dL). Liver Function Tests: Recent Labs  Lab 08/16/21 1836  AST 20  ALT 21  ALKPHOS 58  BILITOT 0.7  PROT 6.3*  ALBUMIN 2.6*    Recent Labs  Lab 08/16/21 1836  LIPASE 25    No results for input(s): AMMONIA in the last 168 hours. Coagulation Profile: No results for input(s): INR, PROTIME in the last 168 hours. Cardiac Enzymes: No results for input(s): CKTOTAL, CKMB, CKMBINDEX, TROPONINI in the last 168 hours. BNP (last 3 results) No results for input(s): PROBNP in the last 8760 hours. HbA1C: Recent Labs    08/20/21 0235  HGBA1C 5.9*    CBG: No results for input(s): GLUCAP in the last 168 hours. Lipid Profile: No results for input(s): CHOL, HDL, LDLCALC, TRIG, CHOLHDL, LDLDIRECT in the last 72 hours. Thyroid Function Tests: No results for input(s): TSH, T4TOTAL, FREET4, T3FREE, THYROIDAB in the last 72 hours. Anemia Panel: No results for input(s): VITAMINB12, FOLATE, FERRITIN, TIBC, IRON, RETICCTPCT in the last 72 hours. Sepsis Labs: Recent Labs  Lab 08/16/21 1836 08/17/21 0216 08/18/21 0207 08/19/21 0149  PROCALCITON  --  0.68 0.64 0.46  LATICACIDVEN 0.8 1.0  --   --      Recent Results (from  the past 240 hour(s))  Resp Panel by RT-PCR (Flu A&B, Covid) Nasopharyngeal Swab     Status: Abnormal   Collection Time: 08/11/21  8:36 AM   Specimen: Nasopharyngeal Swab; Nasopharyngeal(NP) swabs in vial transport medium  Result Value Ref Range Status   SARS Coronavirus 2 by RT PCR NEGATIVE NEGATIVE Final    Comment: (NOTE) SARS-CoV-2 target nucleic acids are NOT DETECTED.  The SARS-CoV-2 RNA is generally detectable in upper respiratory specimens during the acute phase of infection. The lowest concentration of SARS-CoV-2 viral copies this assay can detect is 138 copies/mL. A negative result does not preclude SARS-Cov-2 infection and should not be used as the sole basis for treatment or other patient management decisions. A negative result may occur with  improper specimen collection/handling, submission of specimen other than nasopharyngeal swab, presence of viral mutation(s) within the areas targeted by this assay, and inadequate number of viral copies(<138 copies/mL). A negative result must be combined with clinical observations, patient history, and epidemiological information. The expected result is Negative.  Fact Sheet for Patients:  EntrepreneurPulse.com.au  Fact Sheet for Healthcare Providers:  IncredibleEmployment.be  This test is no t yet approved or cleared by the [place] FDA and  has been authorized for detection and/or diagnosis of SARS-CoV-2 by FDA under an Emergency Use Authorization (EUA). This EUA will remain  in effect (meaning this test can be  used) for the duration of the COVID-19 declaration under Section 564(b)(1) of the Act, 21 U.S.C.section 360bbb-3(b)(1), unless the authorization is terminated  or revoked sooner.       Influenza A by PCR POSITIVE (A) NEGATIVE Final   Influenza B by PCR NEGATIVE NEGATIVE Final    Comment: (NOTE) The Xpert Xpress SARS-CoV-2/FLU/RSV plus assay is intended as an aid in the  diagnosis of influenza from Nasopharyngeal swab specimens and should not be used as a sole basis for treatment. Nasal washings and aspirates are unacceptable for Xpert Xpress SARS-CoV-2/FLU/RSV testing.  Fact Sheet for Patients: EntrepreneurPulse.com.au  Fact Sheet for Healthcare Providers: IncredibleEmployment.be  This test is not yet approved or cleared by the [place] FDA and has been authorized for detection and/or diagnosis of SARS-CoV-2 by FDA under an Emergency Use Authorization (EUA). This EUA will remain in effect (meaning this test can be used) for the duration of the COVID-19 declaration under Section 564(b)(1) of the Act, 21 U.S.C. section 360bbb-3(b)(1), unless the authorization is terminated or revoked.  Performed at [facility], [place] [address] [place], [place] [address]   Blood culture (routine x 2)     Status: None (Preliminary result)   Collection Time: 08/16/21  5:37 PM   Specimen: BLOOD  Result Value Ref Range Status   Specimen Description BLOOD SITE NOT SPECIFIED  Final   Special Requests   Final    BOTTLES DRAWN AEROBIC AND ANAEROBIC Blood Culture adequate volume   Culture   Final    NO GROWTH 4 DAYS Performed at [facility], [address] [place], [place] [address]    Report Status PENDING  Incomplete  Resp Panel by RT-PCR (Flu A&B, Covid) Nasopharyngeal Swab     Status: Abnormal   Collection Time: 08/16/21  6:31 PM   Specimen: Nasopharyngeal Swab; Nasopharyngeal(NP) swabs in vial transport medium  Result Value Ref Range Status   SARS Coronavirus 2 by RT PCR NEGATIVE NEGATIVE Final    Comment: (NOTE) SARS-CoV-2 target nucleic acids are NOT DETECTED.  The SARS-CoV-2 RNA is generally detectable in upper respiratory specimens during the acute phase of infection. The lowest concentration of SARS-CoV-2 viral copies this assay can detect is 138 copies/mL. A negative result does not  preclude SARS-Cov-2 infection and should not be used as the sole basis for treatment or other patient management decisions. A negative result may occur with  improper specimen collection/handling, submission of specimen other than nasopharyngeal swab, presence of viral mutation(s) within the areas targeted by this assay, and inadequate number of viral copies(<138 copies/mL). A negative result must be combined with clinical observations, patient history, and epidemiological information. The expected result is Negative.  Fact Sheet for Patients:  EntrepreneurPulse.com.au  Fact Sheet for Healthcare Providers:  IncredibleEmployment.be  This test is no t yet approved or cleared by the [place] FDA and  has been authorized for detection and/or diagnosis of SARS-CoV-2 by FDA under an Emergency Use Authorization (EUA). This EUA will remain  in effect (meaning this test can be used) for the duration of the COVID-19 declaration under Section 564(b)(1) of the Act, 21 U.S.C.section 360bbb-3(b)(1), unless the authorization is terminated  or revoked sooner.       Influenza A by PCR POSITIVE (A) NEGATIVE Final   Influenza B by PCR NEGATIVE NEGATIVE Final    Comment: (NOTE) The Xpert Xpress SARS-CoV-2/FLU/RSV plus assay is intended as an aid in the diagnosis of influenza from Nasopharyngeal swab specimens and should not  be used as a sole basis for treatment. Nasal washings and aspirates are unacceptable for Xpert Xpress SARS-CoV-2/FLU/RSV testing.  Fact Sheet for Patients: EntrepreneurPulse.com.au  Fact Sheet for Healthcare Providers: IncredibleEmployment.be  This test is not yet approved or cleared by the [place] FDA and has been authorized for detection and/or diagnosis of SARS-CoV-2 by FDA under an Emergency Use Authorization (EUA). This EUA will remain in effect (meaning this test can be used) for the  duration of the COVID-19 declaration under Section 564(b)(1) of the Act, 21 U.S.C. section 360bbb-3(b)(1), unless the authorization is terminated or revoked.  Performed at [facility], [place] [address], [place], [place] [address]   MRSA Next Gen by PCR, Nasal     Status: None   Collection Time: 08/16/21 10:52 PM   Specimen: Nasal Mucosa; Nasal Swab  Result Value Ref Range Status   MRSA by PCR Next Gen NOT DETECTED NOT DETECTED Final    Comment: (NOTE) The GeneXpert MRSA Assay (FDA approved for NASAL specimens only), is one component of a comprehensive MRSA colonization surveillance program. It is not intended to diagnose MRSA infection nor to guide or monitor treatment for MRSA infections. Test performance is not FDA approved in patients less than 24 years old. Performed at [facility], [place] [address], [place], [place] [address]   Blood culture (routine x 2)     Status: None (Preliminary result)   Collection Time: 08/17/21  2:16 AM   Specimen: BLOOD RIGHT HAND  Result Value Ref Range Status   Specimen Description BLOOD RIGHT HAND  Final   Special Requests   Final    BOTTLES DRAWN AEROBIC AND ANAEROBIC Blood Culture adequate volume   Culture   Final    NO GROWTH 3 DAYS Performed at [facility], [place] [address] [place],  [address]    Report Status PENDING  Incomplete      Radiology Studies: DG Lumbar Spine 2-3 Views  Result Date: 08/20/2021 CLINICAL DATA:  Back pain. EXAM: LUMBAR SPINE - 2-3 VIEW COMPARISON:  Lumbar spine radiograph dated 08/09/2015. FINDINGS: No acute fracture or subluxation of the lumbar spine. There is straightening of normal lumbar lordosis. The L5 is sacralized. There is a grade 1 L4-L5 anterolisthesis. The visualized posterior elements are intact. Lower lumbar facet arthropathy. The soft tissues are unremarkable. IMPRESSION: No acute fracture or subluxation of the lumbar spine. Electronically Signed   By: Anner Crete M.D.    On: 08/20/2021 21:10   US RENAL  Result Date: 08/19/2021 CLINICAL DATA:  Hydronephrosis. EXAM: RENAL / URINARY TRACT ULTRASOUND COMPLETE COMPARISON:  CT AP, 08/16/2021 and 05/03/2021. US Abdomen, 05/03/2021. FINDINGS: Right Kidney: Renal measurements: 12.4 x 4.0 x 6.1 cm = volume: 160 mL. Echogenicity within normal limits. No mass is visualized. Mild-to-moderate volume of hydronephrosis is present. Left Kidney: Renal measurements: 11.8 x 4.0 x 4.4 cm = volume: 107 mL. Echogenicity within normal limits. No mass or hydronephrosis visualized. Bladder: Appears normal for degree of bladder distention. Other: No ascites IMPRESSION: 1. Mild-to-moderate volume RIGHT hydronephrosis. 2. Normal LEFT renal collecting system. Electronically Signed   By: Michaelle Birks M.D.   On: 08/19/2021 11:24   DG Chest Port 1 View  Result Date: 08/20/2021 CLINICAL DATA:  Shortness of breath, code sepsis, history of the flu, lung cancer in the past on LEFT EXAM: PORTABLE CHEST 1 VIEW COMPARISON:  Portable exam 0846 hours compared to 08/16/2021 FINDINGS: Stable heart size. Postsurgical changes and volume loss LEFT  upper lung related to prior lung cancer therapy. Progressive airspace consolidation RIGHT upper lobe and LEFT lower lobe consistent with multifocal pneumonia. Underlying emphysematous changes. Trace LEFT pleural effusion. No pneumothorax. IMPRESSION: Progressive BILATERAL pulmonary infiltrates consistent with multifocal pneumonia. Emphysematous changes with postsurgical changes, volume loss, and scarring in LEFT upper lobe. Electronically Signed   By: Lavonia Dana M.D.   On: 08/20/2021 09:06     LOS: 5 days   Antonieta Pert, MD [facility]  08/21/2021, 7:43 AM

## 2021-08-22 LAB — CBC
HCT: 32.3 % — ABNORMAL LOW (ref 36.0–46.0)
Hemoglobin: 10.6 g/dL — ABNORMAL LOW (ref 12.0–15.0)
MCH: 28.5 pg (ref 26.0–34.0)
MCHC: 32.8 g/dL (ref 30.0–36.0)
MCV: 86.8 fL (ref 80.0–100.0)
Platelets: 434 10*3/uL — ABNORMAL HIGH (ref 150–400)
RBC: 3.72 MIL/uL — ABNORMAL LOW (ref 3.87–5.11)
RDW: 14.8 % (ref 11.5–15.5)
WBC: 13.6 10*3/uL — ABNORMAL HIGH (ref 4.0–10.5)
nRBC: 0 % (ref 0.0–0.2)

## 2021-08-22 LAB — CULTURE, BLOOD (ROUTINE X 2)
Culture: NO GROWTH
Special Requests: ADEQUATE

## 2021-08-22 LAB — EXPECTORATED SPUTUM ASSESSMENT W GRAM STAIN, RFLX TO RESP C

## 2021-08-22 LAB — PROCALCITONIN: Procalcitonin: <0.1 ng/mL

## 2021-08-22 MED ORDER — RIVAROXABAN 10 MG PO TABS
10.0000 mg | ORAL_TABLET | Freq: Every day | ORAL | Status: DC
  Administered 2021-08-22 – 2021-08-25 (×4): 10 mg via ORAL
  Filled 2021-08-22 (×4): qty 1

## 2021-08-22 NOTE — Progress Notes (Signed)
PROGRESS NOTE    Tracy Park  LXB:[id number] DOB: March 22, 1956 DOA: 08/16/2021 PCP: Irving Shows, PA-C   Chief Complaint  Patient presents with   Code Sepsis  Brief Narrative/Hospital Course: Tracy Park, 65 y.o. female with PMH of COPD chronic hypoxia on 2 L nasal cannula, OSA, lung cancer status postchemotherapy and surgery 2 years ago recently diagnosed with flu like symptoms 12/3 positive for influenza chest x-ray no pneumonia discharged home returns to the ED on 12/8 with worsening shortness of breath cough fever and abdominal pain. On EMS saturating 87% on room air improved to 94% on 2 L febrile 100.7 tachycardic in 110 tachypneic in the mid to high 20s influenza A positive COVID-19 negative, chest x-ray emphysema and likely superimposed bilateral lower lobe infection/inflammation-she was admitted for sepsis with influenza and pneumonia along with COPD exacerbation acute on chronic hypoxic respiratory failure. Patient has been on high flow nasal cannula since 12/11 night and is being managed with broad-spectrum antibiotics, pulmonary toileting, steroids.   Subjective: Seen and examined this morning. Reports feeling some better On 7 L HFNC, WBC downtrending procalcitonin reassuring  < 0.1 She becomes more hypoxic when moving to the bedside, and during coughing spells  Assessment & Plan: Sepsis POA Influenza A with pneumonia Multifocal pneumonia: Sepsis in the setting of influenza A infection and superimposed bacterial pneumonia chest x-ray 12/12 with progressive multifocal pneumonia, possible postviral bacterial infection.  MRSA negative seen by pulmonary abrasion but being managed with broad-spectrum antibiotics.  Continue antitussives, bronchodilators supportive care.  Blood culture no growth so far.   Acute on chronic hypoxic respiratory failure Acute COPD exacerbation: normally on 2 L [place] - Respiratory status need tenuous needing HFNC10 L , multifactorial with due to  COPD exacerbation multifocal pneumonia.  Coughing spells making her oxygen worse.  Pulmonary has seen the patient advised to continue the current management wean oxygen as tolerated.   Hyperglycemia A1c 5.9, prediabetes, on steroid, monitor CBG  Mild hyponatremia: Improved Hypokalemia/hypomagnesemia: resolved  Right flank pain/mild right-sided hydro ureteral nephrosis History of right ureteral stone Low back pain:: Seen by Dr. Tresa Moore from urology, ubnclear if chronic from prior stone or perhaps indicative of some very mild partial obstruction from stricture , continue Flomax, no intervention recommended follow-up with [facility] urology.  Consider repeat elective diagnostic ureteroscopy or nuclear medicine renogram to rule out significant obstruction if persist in outpatient setting.  X-ray lumbar spine no acute finding, continue pain management, local heat.  GERD continue PPI and H2 blocker HLD continue statin Hypothyroidism with history of thyroidectomy, continue Synthroid  Anxiety/depression/bipolar disorder: Mood appears stable  continue her  home meds  Nutrition Status: Nutrition Problem: Increased nutrient needs Etiology: acute illness (sepsis secondary to influenza A pneumonia) Signs/Symptoms: estimated needs Interventions: Ensure Enlive (each supplement provides 350kcal and 20 grams of protein), MVI, Liberalize Diet   DVT prophylaxis: enoxaparin (LOVENOX) injection 40 mg Start: 08/21/21 1430 Place and maintain sequential compression device Start: 08/21/21 1430 Code Status:   Code Status: DNR Family Communication: plan of care discussed with patient at bedside. Status is: Inpatient Remains inpatient appropriate because: For ongoing management of pneumonia respiratory failure Disposition: Currently not medically stable for discharge. Anticipated Disposition: TBD  Objective: Vitals last 24 hrs: Vitals:   08/22/21 0236 08/22/21 0533 08/22/21 0733 08/22/21 0756  BP: 134/83 140/85   130/85  Pulse: 82 83 90 82  Resp: 20 20 16 18   Temp: (!) 97.5 F (36.4 C) 97.6 F (36.4 C)  98.1 F (  36.7 C)  TempSrc: Oral Oral  Oral  SpO2: 97% 96% 97% 92%  Weight:      Height:       Weight change:   Intake/Output Summary (Last 24 hours) at 08/22/2021 1022 Last data filed at 08/21/2021 1700 Gross per 24 hour  Intake 720 ml  Output --  Net 720 ml   Net IO Since Admission: 4,686.12 mL [08/22/21 1022]   Physical Examination: General exam: AAOx 3, elderly looking, older than stated age, thin and frail  HEENT:Oral mucosa moist, Ear/Nose WNL grossly, dentition normal. Respiratory system: bilaterally diminished, no use of accessory muscle Cardiovascular system: S1 & S2 +, No JVD,. Gastrointestinal system: Abdomen soft,NT,ND, BS+ Nervous System:Alert, awake, moving extremities and grossly nonfocal Extremities: no edema, distal peripheral pulses palpable.  Skin: No rashes,no icterus. MSK: Normal muscle bulk,tone, power    Medications reviewed:  Scheduled Meds:  busPIRone  5 mg Oral TID   enoxaparin (LOVENOX) injection  40 mg Subcutaneous Daily   famotidine  40 mg Oral Daily   feeding supplement  1 Container Oral TID BM   guaiFENesin  600 mg Oral BID   levothyroxine  137 mcg Oral QAC breakfast   lidocaine  1 patch Transdermal Q24H   mometasone-formoterol  2 puff Inhalation BID   multivitamin with minerals  1 tablet Oral Daily   oxybutynin  5 mg Oral TID   pantoprazole  40 mg Oral Daily   polyethylene glycol  17 g Oral BID   predniSONE  30 mg Oral Q breakfast   QUEtiapine  300 mg Oral QHS   rosuvastatin  20 mg Oral Daily   sertraline  100 mg Oral BID   tamsulosin  0.4 mg Oral Daily   venlafaxine XR  75 mg Oral Q breakfast   Continuous Infusions:  azithromycin 500 mg (08/21/21 1409)   cefTRIAXone (ROCEPHIN)  IV 2 g (08/21/21 1810)   vancomycin 750 mg (08/21/21 2252)    Diet Order             Diet regular Room service appropriate? Yes; Fluid consistency: Thin   Diet effective now                   Nutrition Problem: Increased nutrient needs Etiology: acute illness (sepsis secondary to influenza A pneumonia) Signs/Symptoms: estimated needs Interventions: Ensure Enlive (each supplement provides 350kcal and 20 grams of protein), MVI, Liberalize Diet  Weight change:   Wt Readings from Last 3 Encounters:  08/16/21 45.4 kg  05/03/21 43.5 kg  04/09/20 (!) 40.8 kg     Consultants:see note  Procedures:see note Antimicrobials: Anti-infectives (From admission, onward)    Start     Dose/Rate Route Frequency Ordered Stop   08/21/21 2200  vancomycin (VANCOREADY) IVPB 750 mg/150 mL        750 mg 150 mL/hr over 60 Minutes Intravenous Every 24 hours 08/20/21 1210     08/20/21 2030  vancomycin (VANCOCIN) IVPB 1000 mg/200 mL premix        1,000 mg 200 mL/hr over 60 Minutes Intravenous  Once 08/20/21 1932 08/20/21 2306   08/20/21 1300  vancomycin (VANCOCIN) IVPB 1000 mg/200 mL premix  Status:  Discontinued        1,000 mg 200 mL/hr over 60 Minutes Intravenous  Once 08/20/21 1210 08/20/21 1932   08/20/21 1100  azithromycin (ZITHROMAX) 500 mg in sodium chloride 0.9 % 250 mL IVPB        500 mg 250 mL/hr over 60 Minutes Intravenous  Every 24 hours 08/20/21 1005     08/19/21 1800  cefTRIAXone (ROCEPHIN) 2 g in sodium chloride 0.9 % 100 mL IVPB        2 g 200 mL/hr over 30 Minutes Intravenous Every 24 hours 08/19/21 1039     08/17/21 2000  vancomycin (VANCOREADY) IVPB 750 mg/150 mL  Status:  Discontinued        750 mg 150 mL/hr over 60 Minutes Intravenous Every 24 hours 08/16/21 2023 08/17/21 0857   08/17/21 0600  ceFEPIme (MAXIPIME) 2 g in sodium chloride 0.9 % 100 mL IVPB  Status:  Discontinued        2 g 200 mL/hr over 30 Minutes Intravenous Every 12 hours 08/16/21 2301 08/19/21 1039   08/16/21 2315  oseltamivir (TAMIFLU) capsule 30 mg        30 mg Oral 2 times daily 08/16/21 2302 08/21/21 0859   08/16/21 2300  oseltamivir (TAMIFLU) capsule 75 mg   Status:  Discontinued        75 mg Oral 2 times daily 08/16/21 2253 08/16/21 2302   08/16/21 2015  vancomycin (VANCOCIN) IVPB 1000 mg/200 mL premix        1,000 mg 200 mL/hr over 60 Minutes Intravenous  Once 08/16/21 2013 08/16/21 2202   08/16/21 1745  cefTRIAXone (ROCEPHIN) 2 g in sodium chloride 0.9 % 100 mL IVPB  Status:  Discontinued        2 g 200 mL/hr over 30 Minutes Intravenous Every 24 hours 08/16/21 1730 08/16/21 1734   08/16/21 1745  azithromycin (ZITHROMAX) 500 mg in sodium chloride 0.9 % 250 mL IVPB  Status:  Discontinued        500 mg 250 mL/hr over 60 Minutes Intravenous Every 24 hours 08/16/21 1730 08/16/21 1734   08/16/21 1745  ceFEPIme (MAXIPIME) 1 g in sodium chloride 0.9 % 100 mL IVPB  Status:  Discontinued        1 g 200 mL/hr over 30 Minutes Intravenous  Once 08/16/21 1734 08/16/21 1742   08/16/21 1745  ceFEPIme (MAXIPIME) 2 g in sodium chloride 0.9 % 100 mL IVPB        2 g 200 mL/hr over 30 Minutes Intravenous  Once 08/16/21 1742 08/16/21 1926      Culture/Microbiology    Component Value Date/Time   SDES BLOOD RIGHT HAND 08/17/2021 0216   SPECREQUEST  08/17/2021 0216    BOTTLES DRAWN AEROBIC AND ANAEROBIC Blood Culture adequate volume   CULT  08/17/2021 0216    NO GROWTH 5 DAYS Performed at [facility], [place] [address], [place], [place] [address]    REPTSTATUS 08/22/2021 FINAL 08/17/2021 0216    Other culture-see note  Unresulted Labs (From admission, onward)     Start     Ordered   08/22/21 0500  Procalcitonin  Daily,   R     Question:  Specimen collection method  Answer:  Lab=Lab collect   08/21/21 1057   08/21/21 1056  Expectorated Sputum Assessment w Gram Stain, Rflx to Resp Cult  Once,   R        08/21/21 1055   08/21/21 0500  CBC  Daily,   R      08/20/21 1105   08/17/21 1745  Expectorated Sputum Assessment w Gram Stain, Rflx to Resp Cult  Once,   R        08/17/21 1744          Data Reviewed: I have personally reviewed following  labs and imaging studies CBC: Recent Labs  Lab 08/16/21 1836 08/18/21 0207 08/20/21 0235 08/21/21 0331 08/22/21 0215  WBC 7.0 10.5 16.7* 14.0* 13.6*  NEUTROABS 5.6 10.1* 15.2*  --   --   HGB 12.1 10.7* 9.7* 10.1* 10.6*  HCT 35.8* 32.1* 29.7* 30.9* 32.3*  MCV 85.9 86.8 87.6 86.6 86.8  PLT 246 296 376 395 093*   Basic Metabolic Panel: Recent Labs  Lab 08/16/21 1836 08/17/21 0216 08/18/21 0207 08/19/21 0149 08/20/21 0235 08/21/21 0331  NA 132* 137 137 141 140 138  K 3.2* 4.0 3.7 3.5 3.7 3.9  CL 96* 104 102 106 106 103  CO2 26 26 26 25 26 27   GLUCOSE 110* 103* 156* 130* 80 72  BUN 13 12 9 16  24* 15  CREATININE 0.74 0.51 0.58 0.57 0.70 0.58  CALCIUM 8.3* 8.4* 8.2* 8.6* 8.6* 8.6*  MG 1.6* 1.7 2.1 2.2  --   --    GFR: Estimated Creatinine Clearance: 51.6 mL/min (by C-G formula based on SCr of 0.58 mg/dL). Liver Function Tests: Recent Labs  Lab 08/16/21 1836  AST 20  ALT 21  ALKPHOS 58  BILITOT 0.7  PROT 6.3*  ALBUMIN 2.6*   Recent Labs  Lab 08/16/21 1836  LIPASE 25   No results for input(s): AMMONIA in the last 168 hours. Coagulation Profile: No results for input(s): INR, PROTIME in the last 168 hours. Cardiac Enzymes: No results for input(s): CKTOTAL, CKMB, CKMBINDEX, TROPONINI in the last 168 hours. BNP (last 3 results) No results for input(s): PROBNP in the last 8760 hours. HbA1C: Recent Labs    08/20/21 0235  HGBA1C 5.9*   CBG: No results for input(s): GLUCAP in the last 168 hours. Lipid Profile: No results for input(s): CHOL, HDL, LDLCALC, TRIG, CHOLHDL, LDLDIRECT in the last 72 hours. Thyroid Function Tests: No results for input(s): TSH, T4TOTAL, FREET4, T3FREE, THYROIDAB in the last 72 hours. Anemia Panel: No results for input(s): VITAMINB12, FOLATE, FERRITIN, TIBC, IRON, RETICCTPCT in the last 72 hours. Sepsis Labs: Recent Labs  Lab 08/16/21 1836 08/17/21 0216 08/18/21 0207 08/19/21 0149 08/22/21 0215  PROCALCITON  --  0.68 0.64 0.46  <0.10  LATICACIDVEN 0.8 1.0  --   --   --     Recent Results (from the past 240 hour(s))  Blood culture (routine x 2)     Status: None   Collection Time: 08/16/21  5:37 PM   Specimen: BLOOD  Result Value Ref Range Status   Specimen Description BLOOD SITE NOT SPECIFIED  Final   Special Requests   Final    BOTTLES DRAWN AEROBIC AND ANAEROBIC Blood Culture adequate volume   Culture   Final    NO GROWTH 5 DAYS Performed at [facility], [address] [place], [place] [address]    Report Status 08/21/2021 FINAL  Final  Resp Panel by RT-PCR (Flu A&B, Covid) Nasopharyngeal Swab     Status: Abnormal   Collection Time: 08/16/21  6:31 PM   Specimen: Nasopharyngeal Swab; Nasopharyngeal(NP) swabs in vial transport medium  Result Value Ref Range Status   SARS Coronavirus 2 by RT PCR NEGATIVE NEGATIVE Final    Comment: (NOTE) SARS-CoV-2 target nucleic acids are NOT DETECTED.  The SARS-CoV-2 RNA is generally detectable in upper respiratory specimens during the acute phase of infection. The lowest concentration of SARS-CoV-2 viral copies this assay can detect is 138 copies/mL. A negative result does not preclude SARS-Cov-2 infection and should not be used as the sole  basis for treatment or other patient management decisions. A negative result may occur with  improper specimen collection/handling, submission of specimen other than nasopharyngeal swab, presence of viral mutation(s) within the areas targeted by this assay, and inadequate number of viral copies(<138 copies/mL). A negative result must be combined with clinical observations, patient history, and epidemiological information. The expected result is Negative.  Fact Sheet for Patients:  EntrepreneurPulse.com.au  Fact Sheet for Healthcare Providers:  IncredibleEmployment.be  This test is no t yet approved or cleared by the [place] FDA and  has been authorized for detection and/or  diagnosis of SARS-CoV-2 by FDA under an Emergency Use Authorization (EUA). This EUA will remain  in effect (meaning this test can be used) for the duration of the COVID-19 declaration under Section 564(b)(1) of the Act, 21 U.S.C.section 360bbb-3(b)(1), unless the authorization is terminated  or revoked sooner.       Influenza A by PCR POSITIVE (A) NEGATIVE Final   Influenza B by PCR NEGATIVE NEGATIVE Final    Comment: (NOTE) The Xpert Xpress SARS-CoV-2/FLU/RSV plus assay is intended as an aid in the diagnosis of influenza from Nasopharyngeal swab specimens and should not be used as a sole basis for treatment. Nasal washings and aspirates are unacceptable for Xpert Xpress SARS-CoV-2/FLU/RSV testing.  Fact Sheet for Patients: EntrepreneurPulse.com.au  Fact Sheet for Healthcare Providers: IncredibleEmployment.be  This test is not yet approved or cleared by the [place] FDA and has been authorized for detection and/or diagnosis of SARS-CoV-2 by FDA under an Emergency Use Authorization (EUA). This EUA will remain in effect (meaning this test can be used) for the duration of the COVID-19 declaration under Section 564(b)(1) of the Act, 21 U.S.C. section 360bbb-3(b)(1), unless the authorization is terminated or revoked.  Performed at [facility], [place] [address] [place], [place] [address]   MRSA Next Gen by PCR, Nasal     Status: None   Collection Time: 08/16/21 10:52 PM   Specimen: Nasal Mucosa; Nasal Swab  Result Value Ref Range Status   MRSA by PCR Next Gen NOT DETECTED NOT DETECTED Final    Comment: (NOTE) The GeneXpert MRSA Assay (FDA approved for NASAL specimens only), is one component of a comprehensive MRSA colonization surveillance program. It is not intended to diagnose MRSA infection nor to guide or monitor treatment for MRSA infections. Test performance is not FDA approved in patients less than 105 years old. Performed at  [facility], [place] [address], [place], [place] [address]   Blood culture (routine x 2)     Status: None   Collection Time: 08/17/21  2:16 AM   Specimen: BLOOD RIGHT HAND  Result Value Ref Range Status   Specimen Description BLOOD RIGHT HAND  Final   Special Requests   Final    BOTTLES DRAWN AEROBIC AND ANAEROBIC Blood Culture adequate volume   Culture   Final    NO GROWTH 5 DAYS Performed at [facility], [place] [address], [place],  [address]    Report Status 08/22/2021 FINAL  Final      Radiology Studies: DG Lumbar Spine 2-3 Views  Result Date: 08/20/2021 CLINICAL DATA:  Back pain. EXAM: LUMBAR SPINE - 2-3 VIEW COMPARISON:  Lumbar spine radiograph dated 08/09/2015. FINDINGS: No acute fracture or subluxation of the lumbar spine. There is straightening of normal lumbar lordosis. The L5 is sacralized. There is a grade 1 L4-L5 anterolisthesis. The visualized posterior elements are intact. Lower lumbar facet arthropathy. The soft  tissues are unremarkable. IMPRESSION: No acute fracture or subluxation of the lumbar spine. Electronically Signed   By: Anner Crete M.D.   On: 08/20/2021 21:10   VAS [place] LOWER EXTREMITY VENOUS (DVT)  Result Date: 08/21/2021  Lower Venous DVT Study Patient Name:  Tracy Park  Date of Exam:   08/21/2021 Medical Rec #: [id number]         Accession #:    [id number] Date of Birth: [birth year]         Patient Gender: F Patient Age:   74 years Exam Location:  [facility] Procedure:      VAS [place] LOWER EXTREMITY VENOUS (DVT) Referring Phys: Ina Homes --------------------------------------------------------------------------------  Indications: Edema.  Comparison Study: no prior Performing Technologist: Archie Patten RVS  Examination Guidelines: A complete evaluation includes B-mode imaging, spectral Doppler, color Doppler, and power Doppler as needed of all accessible portions of each vessel. Bilateral testing is considered an integral part  of a complete examination. Limited examinations for reoccurring indications may be performed as noted. The reflux portion of the exam is performed with the patient in reverse Trendelenburg.  +---------+---------------+---------+-----------+----------+--------------+  RIGHT     Compressibility Phasicity Spontaneity Properties Thrombus Aging  +---------+---------------+---------+-----------+----------+--------------+  CFV       Full            Yes       Yes                                    +---------+---------------+---------+-----------+----------+--------------+  SFJ       Full                                                             +---------+---------------+---------+-----------+----------+--------------+  FV Prox   Full                                                             +---------+---------------+---------+-----------+----------+--------------+  FV Mid    Full                                                             +---------+---------------+---------+-----------+----------+--------------+  FV Distal Full                                                             +---------+---------------+---------+-----------+----------+--------------+  PFV       Full                                                             +---------+---------------+---------+-----------+----------+--------------+  POP       Full            Yes       Yes                                    +---------+---------------+---------+-----------+----------+--------------+  PTV       Full                                                             +---------+---------------+---------+-----------+----------+--------------+  PERO      Full                                                             +---------+---------------+---------+-----------+----------+--------------+   +---------+---------------+---------+-----------+----------+--------------+  LEFT      Compressibility Phasicity Spontaneity Properties Thrombus Aging   +---------+---------------+---------+-----------+----------+--------------+  CFV       Full            Yes       Yes                                    +---------+---------------+---------+-----------+----------+--------------+  SFJ       Full                                                             +---------+---------------+---------+-----------+----------+--------------+  FV Prox   Full                                                             +---------+---------------+---------+-----------+----------+--------------+  FV Mid    Full                                                             +---------+---------------+---------+-----------+----------+--------------+  FV Distal Full                                                             +---------+---------------+---------+-----------+----------+--------------+  PFV       Full                                                             +---------+---------------+---------+-----------+----------+--------------+  POP       Full            Yes       Yes                                    +---------+---------------+---------+-----------+----------+--------------+  PTV       Full                                                             +---------+---------------+---------+-----------+----------+--------------+  PERO      Full                                                             +---------+---------------+---------+-----------+----------+--------------+     Summary: BILATERAL: - No evidence of deep vein thrombosis seen in the lower extremities, bilaterally. -No evidence of popliteal cyst, bilaterally.   *See table(s) above for measurements and observations. Electronically signed by Deitra Mayo MD on 08/21/2021 at 1:56:40 PM.    Final      LOS: 6 days   Antonieta Pert, MD [facility]  08/22/2021, 10:22 AM

## 2021-08-23 LAB — CBC
HCT: 31.5 % — ABNORMAL LOW (ref 36.0–46.0)
Hemoglobin: 10.3 g/dL — ABNORMAL LOW (ref 12.0–15.0)
MCH: 28.5 pg (ref 26.0–34.0)
MCHC: 32.7 g/dL (ref 30.0–36.0)
MCV: 87.3 fL (ref 80.0–100.0)
Platelets: 437 10*3/uL — ABNORMAL HIGH (ref 150–400)
RBC: 3.61 MIL/uL — ABNORMAL LOW (ref 3.87–5.11)
RDW: 15 % (ref 11.5–15.5)
WBC: 13.6 10*3/uL — ABNORMAL HIGH (ref 4.0–10.5)
nRBC: 0 % (ref 0.0–0.2)

## 2021-08-23 LAB — PROCALCITONIN: Procalcitonin: <0.1 ng/mL

## 2021-08-23 MED ORDER — ALUM & MAG HYDROXIDE-SIMETH 200-200-20 MG/5ML PO SUSP
30.0000 mL | Freq: Once | ORAL | Status: AC
  Administered 2021-08-23: 30 mL via ORAL
  Filled 2021-08-23: qty 30

## 2021-08-23 MED ORDER — ENSURE ENLIVE PO LIQD
237.0000 mL | Freq: Two times a day (BID) | ORAL | Status: DC
  Administered 2021-08-24: 237 mL via ORAL

## 2021-08-23 NOTE — Progress Notes (Incomplete)
Nutrition Follow-up  DOCUMENTATION CODES:   Underweight  INTERVENTION:  -d/c Boost Breeze -Ensure Enlive po BID, each supplement provides 350 kcal and 20 grams of protein -MVI with minerals daily  NUTRITION DIAGNOSIS:   Increased nutrient needs related to acute illness (sepsis secondary to influenza A pneumonia) as evidenced by estimated needs.  ongoing  GOAL:   Patient will meet greater than or equal to 90% of their needs  progressing  MONITOR:   PO intake, Supplement acceptance, Weight trends  REASON FOR ASSESSMENT:   Consult Assessment of nutrition requirement/status  ASSESSMENT:   65 year old female who presented to the ED on 12/08 as a code sepsis. PMH of bipolar disorder, COPD on 2 L oxygen, hypothyroidism, lung cancer s/p resection, anxiety, depression, CAD, PTSD, OSA, GERD, HLD. Pt admitted with sepsis secondary to influenza A pneumonia.  Pt reports feeling better today and denies any shortness of breath -- pt dropped down from 7L to 5L via [place] today with plans to continue to wean. Pt's appetite/intake have been excellent with 50-100% meal completion for the last 5 recorded meals (90% avg meal intake). Per RN, pt refusing Boost today but has previously accepted it. Will transition pt to Ensure given it provides more calories/protein and pt is malnourished.   UOP: 474ml x24 hours I/O: +4752ml since admit  Medications: Scheduled Meds:  busPIRone  5 mg Oral TID   famotidine  40 mg Oral Daily   feeding supplement  1 Container Oral TID BM   guaiFENesin  600 mg Oral BID   levothyroxine  137 mcg Oral QAC breakfast   lidocaine  1 patch Transdermal Q24H   mometasone-formoterol  2 puff Inhalation BID   multivitamin with minerals  1 tablet Oral Daily   oxybutynin  5 mg Oral TID   pantoprazole  40 mg Oral Daily   polyethylene glycol  17 g Oral BID   predniSONE  30 mg Oral Q breakfast   QUEtiapine  300 mg Oral QHS   rivaroxaban  10 mg Oral Daily   rosuvastatin  20  mg Oral Daily   sertraline  100 mg Oral BID   tamsulosin  0.4 mg Oral Daily   venlafaxine XR  75 mg Oral Q breakfast  Continuous Infusions:  azithromycin 500 mg (08/23/21 0826)   cefTRIAXone (ROCEPHIN)  IV Stopped (08/22/21 1852)   Labs: Recent Labs  Lab 08/17/21 0216 08/18/21 0207 08/19/21 0149 08/20/21 0235 08/21/21 0331  NA 137 137 141 140 138  K 4.0 3.7 3.5 3.7 3.9  CL 104 102 106 106 103  CO2 26 26 25 26 27   BUN 12 9 16  24* 15  CREATININE 0.51 0.58 0.57 0.70 0.58  CALCIUM 8.4* 8.2* 8.6* 8.6* 8.6*  MG 1.7 2.1 2.2  --   --   GLUCOSE 103* 156* 130* 80 72   NUTRITION - FOCUSED PHYSICAL EXAM:   Diet Order:   Diet Order             Diet regular Room service appropriate? Yes; Fluid consistency: Thin  Diet effective now                   EDUCATION NEEDS:   Not appropriate for education at this time  Skin:  Skin Assessment: Reviewed RN Assessment  Last BM:  12/14  Height:   Ht Readings from Last 1 Encounters:  08/16/21 5\' 3"  (1.6 m)    Weight:   Wt Readings from Last 1 Encounters:  08/16/21 45.4 kg  BMI:  Body mass index is 17.71 kg/m.  Estimated Nutritional Needs:   Kcal:  1500-1700  Protein:  70-80 grams  Fluid:  1.5-1.7 L     Theone Stanley., MS, RD, LDN (she/her/hers) RD pager number and weekend/on-call pager number located in [place].

## 2021-08-23 NOTE — Progress Notes (Signed)
PROGRESS NOTE    Tracy Park  OJJ:[id number] DOB: [birth year] DOA: 08/16/2021 PCP: Irving Shows, PA-C   Chief Complaint  Patient presents with   Code Sepsis  Brief Narrative/Hospital Course: Tracy Park, 65 y.o. female with PMH of COPD chronic hypoxia on 2 L nasal cannula, OSA, lung cancer status postchemotherapy and surgery 2 years ago recently diagnosed with flu like symptoms 12/3 positive for influenza chest x-ray no pneumonia discharged home returns to the ED on 12/8 with worsening shortness of breath cough fever and abdominal pain. On EMS saturating 87% on room air improved to 94% on 2 L febrile 100.7 tachycardic in 110 tachypneic in the mid to high 20s influenza A positive COVID-19 negative, chest x-ray emphysema and likely superimposed bilateral lower lobe infection/inflammation-she was admitted for sepsis with influenza and pneumonia along with COPD exacerbation acute on chronic hypoxic respiratory failure. Patient has been on high flow nasal cannula since 12/11 night and is being managed with broad-spectrum antibiotics, pulmonary toileting, steroids.   Subjective: Seen and Examined. Overnight afebrile WBC count stable at 13.6, procalcitonin less than 0.1, on HFNC No pain issues. She feels better today no shortness of breath still on 7 L I [place]- cut down to 5 L  Assessment & Plan: Sepsis POA Influenza A with pneumonia Multifocal pneumonia: Sepsis in the setting of influenza A infection and superimposed bacterial pneumonia chest x-ray 12/12 with progressive multifocal pneumonia, possible postviral bacterial infection.  MRSA negative seen by pulmonary managed with broad-spectrum antibiotics-vancomycin discontinued 12/14 as per pulmonary, continue ceftriaxone, azithromycin .  Clinically improving, decrease supplemental oxygen to 5 L, continue to wean oxygen, continue antitussives, bronchodilators supportive care.  Blood culture no growth so far.   Acute on chronic hypoxic  respiratory failure Acute COPD exacerbation: normally on 2 L [place] - needing HFNC up to 10 L> 7l> 5l today. It is  multifactorial with due to COPD exacerbation multifocal pneumonia.  Coughing spells making her oxygen worse.  Continue bronchodilators antitussives.  As tolerated.  Pulmonary signed off.    Hyperglycemia A1c 5.9, prediabetes, on steroid, monitor CBG  Mild hyponatremia: Improved Hypokalemia/hypomagnesemia: Resolved  Right flank pain/mild right-sided hydro ureteral nephrosis History of right ureteral stone Low back pain:: Seen by Dr. Tresa Moore from urology, unclear if chronic from prior stone or perhaps indicative of some very mild partial obstruction from stricture , continue Flomax, no intervention recommended-patient will need outpatient follow-up, urology may consider repeat elective diagnostic ureteroscopy or nuclear medicine renogram to rule out significant obstruction if persist in outpatient setting.  X-ray lumbar spine no acute finding, continue pain management, local heat.  GERD  on PPI and H2 blocker HLD on statin Hypothyroidism with history of thyroidectomy, on Synthroid Anxiety/depression/bipolar disorder: stable, at times anxious-continue her  home meds  Nutrition Status: Nutrition Problem: Increased nutrient needs Etiology: acute illness (sepsis secondary to influenza A pneumonia) Signs/Symptoms: estimated needs Interventions: Ensure Enlive (each supplement provides 350kcal and 20 grams of protein), MVI, Liberalize Diet   DVT prophylaxis: rivaroxaban (XARELTO) tablet 10 mg Start: 08/22/21 1445 Place and maintain sequential compression device Start: 08/21/21 1430 Code Status:   Code Status: DNR Family Communication: plan of care discussed with patient at bedside. Status is: Inpatient Remains inpatient appropriate because: For ongoing management of pneumonia respiratory failure Disposition: Currently not medically stable for discharge. Anticipated Disposition: Obtain  PT OT evaluation for disposition.  Objective: Vitals last 24 hrs: Vitals:   08/22/21 1948 08/22/21 2002 08/23/21 0334 08/23/21 0754  BP: 129/76  [name])  144/80 134/84  Pulse: 81  80 85  Resp: 18  18 18   Temp: 98.4 F (36.9 C)  97.8 F (36.6 C) 98.4 F (36.9 C)  TempSrc: Oral  Oral Oral  SpO2: 94% 98% 93% 98%  Weight:      Height:       Weight change:   Intake/Output Summary (Last 24 hours) at 08/23/2021 1009 Last data filed at 08/23/2021 0517 Gross per 24 hour  Intake 420 ml  Output --  Net 420 ml   Net IO Since Admission: 5,106.12 mL [08/23/21 1009]   Physical Examination: General exam: AAOx 3, thin frail, older than stated age, weak appearing. HEENT:Oral mucosa moist, Ear/Nose WNL grossly, dentition normal. Respiratory system: bilaterally diminished at bases, clear otherwise, no use of accessory muscle Cardiovascular system: S1 & S2 +, No JVD,. Gastrointestinal system: Abdomen soft, NT,ND, BS+ Nervous System:Alert, awake, moving extremities and grossly nonfocal Extremities: no edema, distal peripheral pulses palpable.  Skin: No rashes,no icterus. MSK: Normal muscle bulk,tone, power   Medications reviewed:  Scheduled Meds:  busPIRone  5 mg Oral TID   famotidine  40 mg Oral Daily   feeding supplement  1 Container Oral TID BM   guaiFENesin  600 mg Oral BID   levothyroxine  137 mcg Oral QAC breakfast   lidocaine  1 patch Transdermal Q24H   mometasone-formoterol  2 puff Inhalation BID   multivitamin with minerals  1 tablet Oral Daily   oxybutynin  5 mg Oral TID   pantoprazole  40 mg Oral Daily   polyethylene glycol  17 g Oral BID   predniSONE  30 mg Oral Q breakfast   QUEtiapine  300 mg Oral QHS   rivaroxaban  10 mg Oral Daily   rosuvastatin  20 mg Oral Daily   sertraline  100 mg Oral BID   tamsulosin  0.4 mg Oral Daily   venlafaxine XR  75 mg Oral Q breakfast   Continuous Infusions:  azithromycin 500 mg (08/23/21 0826)   cefTRIAXone (ROCEPHIN)  IV Stopped  (08/22/21 1852)    Diet Order             Diet regular Room service appropriate? Yes; Fluid consistency: Thin  Diet effective now                   Nutrition Problem: Increased nutrient needs Etiology: acute illness (sepsis secondary to influenza A pneumonia) Signs/Symptoms: estimated needs Interventions: Ensure Enlive (each supplement provides 350kcal and 20 grams of protein), MVI, Liberalize Diet  Weight change:   Wt Readings from Last 3 Encounters:  08/16/21 45.4 kg  05/03/21 43.5 kg  04/09/20 (!) 40.8 kg     Consultants:see note  Procedures:see note Antimicrobials: Anti-infectives (From admission, onward)    Start     Dose/Rate Route Frequency Ordered Stop   08/21/21 2200  vancomycin (VANCOREADY) IVPB 750 mg/150 mL  Status:  Discontinued        750 mg 150 mL/hr over 60 Minutes Intravenous Every 24 hours 08/20/21 1210 08/22/21 1310   08/20/21 2030  vancomycin (VANCOCIN) IVPB 1000 mg/200 mL premix        1,000 mg 200 mL/hr over 60 Minutes Intravenous  Once 08/20/21 1932 08/20/21 2306   08/20/21 1300  vancomycin (VANCOCIN) IVPB 1000 mg/200 mL premix  Status:  Discontinued        1,000 mg 200 mL/hr over 60 Minutes Intravenous  Once 08/20/21 1210 08/20/21 1932   08/20/21 1100  azithromycin (ZITHROMAX) 500 mg  in sodium chloride 0.9 % 250 mL IVPB        500 mg 250 mL/hr over 60 Minutes Intravenous Every 24 hours 08/20/21 1005     08/19/21 1800  cefTRIAXone (ROCEPHIN) 2 g in sodium chloride 0.9 % 100 mL IVPB        2 g 200 mL/hr over 30 Minutes Intravenous Every 24 hours 08/19/21 1039     08/17/21 2000  vancomycin (VANCOREADY) IVPB 750 mg/150 mL  Status:  Discontinued        750 mg 150 mL/hr over 60 Minutes Intravenous Every 24 hours 08/16/21 2023 08/17/21 0857   08/17/21 0600  ceFEPIme (MAXIPIME) 2 g in sodium chloride 0.9 % 100 mL IVPB  Status:  Discontinued        2 g 200 mL/hr over 30 Minutes Intravenous Every 12 hours 08/16/21 2301 08/19/21 1039   08/16/21 2315   oseltamivir (TAMIFLU) capsule 30 mg        30 mg Oral 2 times daily 08/16/21 2302 08/21/21 0859   08/16/21 2300  oseltamivir (TAMIFLU) capsule 75 mg  Status:  Discontinued        75 mg Oral 2 times daily 08/16/21 2253 08/16/21 2302   08/16/21 2015  vancomycin (VANCOCIN) IVPB 1000 mg/200 mL premix        1,000 mg 200 mL/hr over 60 Minutes Intravenous  Once 08/16/21 2013 08/16/21 2202   08/16/21 1745  cefTRIAXone (ROCEPHIN) 2 g in sodium chloride 0.9 % 100 mL IVPB  Status:  Discontinued        2 g 200 mL/hr over 30 Minutes Intravenous Every 24 hours 08/16/21 1730 08/16/21 1734   08/16/21 1745  azithromycin (ZITHROMAX) 500 mg in sodium chloride 0.9 % 250 mL IVPB  Status:  Discontinued        500 mg 250 mL/hr over 60 Minutes Intravenous Every 24 hours 08/16/21 1730 08/16/21 1734   08/16/21 1745  ceFEPIme (MAXIPIME) 1 g in sodium chloride 0.9 % 100 mL IVPB  Status:  Discontinued        1 g 200 mL/hr over 30 Minutes Intravenous  Once 08/16/21 1734 08/16/21 1742   08/16/21 1745  ceFEPIme (MAXIPIME) 2 g in sodium chloride 0.9 % 100 mL IVPB        2 g 200 mL/hr over 30 Minutes Intravenous  Once 08/16/21 1742 08/16/21 1926      Culture/Microbiology    Component Value Date/Time   SDES EXPECTORATED SPUTUM 08/21/2021 1056   [place] 08/21/2021 1056   CULT  08/17/2021 0216    NO GROWTH 5 DAYS Performed at [facility], [place] [address], [place], [place] [address]    REPTSTATUS 08/22/2021 FINAL 08/21/2021 1056    Other culture-see note  Unresulted Labs (From admission, onward)     Start     Ordered   08/17/21 1745  Expectorated Sputum Assessment w Gram Stain, Rflx to Resp Cult  Once,   R        08/17/21 1744          Data Reviewed: I have personally reviewed following labs and imaging studies CBC: Recent Labs  Lab 08/16/21 1836 08/18/21 0207 08/20/21 0235 08/21/21 0331 08/22/21 0215 08/23/21 0103  WBC 7.0 10.5 16.7* 14.0* 13.6* 13.6*  NEUTROABS 5.6 10.1* 15.2*  --    --   --   HGB 12.1 10.7* 9.7* 10.1* 10.6* 10.3*  HCT 35.8* 32.1* 29.7* 30.9* 32.3* 31.5*  MCV 85.9 86.8 87.6 86.6 86.8  87.3  PLT 246 296 376 395 434* 161*   Basic Metabolic Panel: Recent Labs  Lab 08/16/21 1836 08/17/21 0216 08/18/21 0207 08/19/21 0149 08/20/21 0235 08/21/21 0331  NA 132* 137 137 141 140 138  K 3.2* 4.0 3.7 3.5 3.7 3.9  CL 96* 104 102 106 106 103  CO2 26 26 26 25 26 27   GLUCOSE 110* 103* 156* 130* 80 72  BUN 13 12 9 16  24* 15  CREATININE 0.74 0.51 0.58 0.57 0.70 0.58  CALCIUM 8.3* 8.4* 8.2* 8.6* 8.6* 8.6*  MG 1.6* 1.7 2.1 2.2  --   --    GFR: Estimated Creatinine Clearance: 51.6 mL/min (by C-G formula based on SCr of 0.58 mg/dL). Liver Function Tests: Recent Labs  Lab 08/16/21 1836  AST 20  ALT 21  ALKPHOS 58  BILITOT 0.7  PROT 6.3*  ALBUMIN 2.6*   Recent Labs  Lab 08/16/21 1836  LIPASE 25   No results for input(s): AMMONIA in the last 168 hours. Coagulation Profile: No results for input(s): INR, PROTIME in the last 168 hours. Cardiac Enzymes: No results for input(s): CKTOTAL, CKMB, CKMBINDEX, TROPONINI in the last 168 hours. BNP (last 3 results) No results for input(s): PROBNP in the last 8760 hours. HbA1C: No results for input(s): HGBA1C in the last 72 hours.  CBG: No results for input(s): GLUCAP in the last 168 hours. Lipid Profile: No results for input(s): CHOL, HDL, LDLCALC, TRIG, CHOLHDL, LDLDIRECT in the last 72 hours. Thyroid Function Tests: No results for input(s): TSH, T4TOTAL, FREET4, T3FREE, THYROIDAB in the last 72 hours. Anemia Panel: No results for input(s): VITAMINB12, FOLATE, FERRITIN, TIBC, IRON, RETICCTPCT in the last 72 hours. Sepsis Labs: Recent Labs  Lab 08/16/21 1836 08/17/21 0216 08/17/21 0216 08/18/21 0207 08/19/21 0149 08/22/21 0215 08/23/21 0103  PROCALCITON  --  0.68   < > 0.64 0.46 <0.10 <0.10  LATICACIDVEN 0.8 1.0  --   --   --   --   --    < > = values in this interval not displayed.     Recent Results (from the past 240 hour(s))  Blood culture (routine x 2)     Status: None   Collection Time: 08/16/21  5:37 PM   Specimen: BLOOD  Result Value Ref Range Status   Specimen Description BLOOD SITE NOT SPECIFIED  Final   Special Requests   Final    BOTTLES DRAWN AEROBIC AND ANAEROBIC Blood Culture adequate volume   Culture   Final    NO GROWTH 5 DAYS Performed at [facility], [address], [place], [place] [address]    Report Status 08/21/2021 FINAL  Final  Resp Panel by RT-PCR (Flu A&B, Covid) Nasopharyngeal Swab     Status: Abnormal   Collection Time: 08/16/21  6:31 PM   Specimen: Nasopharyngeal Swab; Nasopharyngeal(NP) swabs in vial transport medium  Result Value Ref Range Status   SARS Coronavirus 2 by RT PCR NEGATIVE NEGATIVE Final    Comment: (NOTE) SARS-CoV-2 target nucleic acids are NOT DETECTED.  The SARS-CoV-2 RNA is generally detectable in upper respiratory specimens during the acute phase of infection. The lowest concentration of SARS-CoV-2 viral copies this assay can detect is 138 copies/mL. A negative result does not preclude SARS-Cov-2 infection and should not be used as the sole basis for treatment or other patient management decisions. A negative result may occur with  improper specimen collection/handling, submission of specimen other than nasopharyngeal swab, presence of viral mutation(s) within the  areas targeted by this assay, and inadequate number of viral copies(<138 copies/mL). A negative result must be combined with clinical observations, patient history, and epidemiological information. The expected result is Negative.  Fact Sheet for Patients:  EntrepreneurPulse.com.au  Fact Sheet for Healthcare Providers:  IncredibleEmployment.be  This test is no t yet approved or cleared by the [place] FDA and  has been authorized for detection and/or diagnosis of SARS-CoV-2 by FDA under an  Emergency Use Authorization (EUA). This EUA will remain  in effect (meaning this test can be used) for the duration of the COVID-19 declaration under Section 564(b)(1) of the Act, 21 U.S.C.section 360bbb-3(b)(1), unless the authorization is terminated  or revoked sooner.       Influenza A by PCR POSITIVE (A) NEGATIVE Final   Influenza B by PCR NEGATIVE NEGATIVE Final    Comment: (NOTE) The Xpert Xpress SARS-CoV-2/FLU/RSV plus assay is intended as an aid in the diagnosis of influenza from Nasopharyngeal swab specimens and should not be used as a sole basis for treatment. Nasal washings and aspirates are unacceptable for Xpert Xpress SARS-CoV-2/FLU/RSV testing.  Fact Sheet for Patients: EntrepreneurPulse.com.au  Fact Sheet for Healthcare Providers: IncredibleEmployment.be  This test is not yet approved or cleared by the [place] FDA and has been authorized for detection and/or diagnosis of SARS-CoV-2 by FDA under an Emergency Use Authorization (EUA). This EUA will remain in effect (meaning this test can be used) for the duration of the COVID-19 declaration under Section 564(b)(1) of the Act, 21 U.S.C. section 360bbb-3(b)(1), unless the authorization is terminated or revoked.  Performed at [facility], [place] [address] [place], [place] [address]   MRSA Next Gen by PCR, Nasal     Status: None   Collection Time: 08/16/21 10:52 PM   Specimen: Nasal Mucosa; Nasal Swab  Result Value Ref Range Status   MRSA by PCR Next Gen NOT DETECTED NOT DETECTED Final    Comment: (NOTE) The GeneXpert MRSA Assay (FDA approved for NASAL specimens only), is one component of a comprehensive MRSA colonization surveillance program. It is not intended to diagnose MRSA infection nor to guide or monitor treatment for MRSA infections. Test performance is not FDA approved in patients less than 14 years old. Performed at [facility], [place] [address], [place], [place] [address]   Blood culture (routine x 2)     Status: None   Collection Time: 08/17/21  2:16 AM   Specimen: BLOOD RIGHT HAND  Result Value Ref Range Status   Specimen Description BLOOD RIGHT HAND  Final   Special Requests   Final    BOTTLES DRAWN AEROBIC AND ANAEROBIC Blood Culture adequate volume   Culture   Final    NO GROWTH 5 DAYS Performed at [facility], [place] [address] [place], [place] [address]    Report Status 08/22/2021 FINAL  Final  Expectorated Sputum Assessment w Gram Stain, Rflx to Resp Cult     Status: None   Collection Time: 08/21/21 10:56 AM   Specimen: Expectorated Sputum  Result Value Ref Range Status   Specimen Description EXPECTORATED SPUTUM  Final   Special Requests NONE  Final   Sputum evaluation   Final    Sputum specimen not acceptable for testing.  Please recollect.   RESULT CALLED TO, READ BACK BY AND VERIFIED WITH: D LEE RN [id number] 08/22/21 A BROWNING Performed at [facility], [place] [address], [place], [place] [address]    Report Status  08/22/2021 FINAL  Final      Radiology Studies: VAS [place] LOWER EXTREMITY VENOUS (DVT)  Result Date: 08/21/2021  Lower Venous DVT Study Patient Name:  BRYANT LIPPS  Date of Exam:   08/21/2021 Medical Rec #: [id number]         Accession #:    [id number] Date of Birth: May 13, 1956         Patient Gender: F Patient Age:   73 years Exam Location:  [facility] Procedure:      VAS [place] LOWER EXTREMITY VENOUS (DVT) Referring Phys: Ina Homes --------------------------------------------------------------------------------  Indications: Edema.  Comparison Study: no prior Performing Technologist: Archie Patten RVS  Examination Guidelines: A complete evaluation includes B-mode imaging, spectral Doppler, color Doppler, and power Doppler as needed of all accessible portions of each vessel. Bilateral testing is considered an integral part of a complete examination. Limited examinations for reoccurring  indications may be performed as noted. The reflux portion of the exam is performed with the patient in reverse Trendelenburg.  +---------+---------------+---------+-----------+----------+--------------+  RIGHT     Compressibility Phasicity Spontaneity Properties Thrombus Aging  +---------+---------------+---------+-----------+----------+--------------+  CFV       Full            Yes       Yes                                    +---------+---------------+---------+-----------+----------+--------------+  SFJ       Full                                                             +---------+---------------+---------+-----------+----------+--------------+  FV Prox   Full                                                             +---------+---------------+---------+-----------+----------+--------------+  FV Mid    Full                                                             +---------+---------------+---------+-----------+----------+--------------+  FV Distal Full                                                             +---------+---------------+---------+-----------+----------+--------------+  PFV       Full                                                             +---------+---------------+---------+-----------+----------+--------------+  POP  Full            Yes       Yes                                    +---------+---------------+---------+-----------+----------+--------------+  PTV       Full                                                             +---------+---------------+---------+-----------+----------+--------------+  PERO      Full                                                             +---------+---------------+---------+-----------+----------+--------------+   +---------+---------------+---------+-----------+----------+--------------+  LEFT      Compressibility Phasicity Spontaneity Properties Thrombus Aging   +---------+---------------+---------+-----------+----------+--------------+  CFV       Full            Yes       Yes                                    +---------+---------------+---------+-----------+----------+--------------+  SFJ       Full                                                             +---------+---------------+---------+-----------+----------+--------------+  FV Prox   Full                                                             +---------+---------------+---------+-----------+----------+--------------+  FV Mid    Full                                                             +---------+---------------+---------+-----------+----------+--------------+  FV Distal Full                                                             +---------+---------------+---------+-----------+----------+--------------+  PFV       Full                                                             +---------+---------------+---------+-----------+----------+--------------+  POP       Full            Yes       Yes                                    +---------+---------------+---------+-----------+----------+--------------+  PTV       Full                                                             +---------+---------------+---------+-----------+----------+--------------+  PERO      Full                                                             +---------+---------------+---------+-----------+----------+--------------+     Summary: BILATERAL: - No evidence of deep vein thrombosis seen in the lower extremities, bilaterally. -No evidence of popliteal cyst, bilaterally.   *See table(s) above for measurements and observations. Electronically signed by Deitra Mayo MD on 08/21/2021 at 1:56:40 PM.    Final      LOS: 7 days   Antonieta Pert, MD [facility]  08/23/2021, 10:09 AM

## 2021-08-23 NOTE — Plan of Care (Signed)

## 2021-08-23 NOTE — Plan of Care (Signed)
  Problem: Education: Goal: Knowledge of General Education information will improve Description: Including pain rating scale, medication(s)/side effects and non-pharmacologic comfort measures Outcome: Progressing   Problem: Clinical Measurements: Goal: Respiratory complications will improve Outcome: Progressing Goal: Cardiovascular complication will be avoided Outcome: Progressing   

## 2021-08-23 NOTE — Plan of Care (Signed)
°  Problem: Education: Goal: Knowledge of General Education information will improve Description: Including pain rating scale, medication(s)/side effects and non-pharmacologic comfort measures 08/23/2021 1128 by Damaris Schooner, RN Outcome: Progressing 08/23/2021 1127 by Damaris Schooner, RN Outcome: Progressing   Problem: Health Behavior/Discharge Planning: Goal: Ability to manage health-related needs will improve 08/23/2021 1128 by Damaris Schooner, RN Outcome: Progressing 08/23/2021 1127 by Damaris Schooner, RN Outcome: Progressing   Problem: Clinical Measurements: Goal: Ability to maintain clinical measurements within normal limits will improve 08/23/2021 1128 by Damaris Schooner, RN Outcome: Progressing 08/23/2021 1127 by Damaris Schooner, RN Outcome: Progressing Goal: Will remain free from infection 08/23/2021 1128 by Damaris Schooner, RN Outcome: Progressing 08/23/2021 1127 by Damaris Schooner, RN Outcome: Progressing Goal: Diagnostic test results will improve 08/23/2021 1128 by Damaris Schooner, RN Outcome: Progressing 08/23/2021 1127 by Damaris Schooner, RN Outcome: Progressing Goal: Respiratory complications will improve 08/23/2021 1128 by Damaris Schooner, RN Outcome: Progressing 08/23/2021 1127 by Damaris Schooner, RN Outcome: Progressing Goal: Cardiovascular complication will be avoided 08/23/2021 1128 by Damaris Schooner, RN Outcome: Progressing 08/23/2021 1127 by Damaris Schooner, RN Outcome: Progressing   Problem: Activity: Goal: Risk for activity intolerance will decrease 08/23/2021 1128 by Damaris Schooner, RN Outcome: Progressing 08/23/2021 1127 by Damaris Schooner, RN Outcome: Progressing   Problem: Nutrition: Goal: Adequate nutrition will be maintained 08/23/2021 1128 by Damaris Schooner, RN Outcome: Progressing 08/23/2021 1127 by Damaris Schooner, RN Outcome: Progressing   Problem: Coping: Goal: Level of anxiety will decrease 08/23/2021 1128 by Damaris Schooner, RN Outcome: Progressing 08/23/2021 1127 by Damaris Schooner, RN Outcome: Progressing   Problem: Elimination: Goal: Will not experience complications related to bowel motility 08/23/2021 1128 by Damaris Schooner, RN Outcome: Progressing 08/23/2021 1127 by Damaris Schooner, RN Outcome: Progressing Goal: Will not experience complications related to urinary retention 08/23/2021 1128 by Damaris Schooner, RN Outcome: Progressing 08/23/2021 1127 by Damaris Schooner, RN Outcome: Progressing   Problem: Pain Managment: Goal: General experience of comfort will improve 08/23/2021 1128 by Damaris Schooner, RN Outcome: Progressing 08/23/2021 1127 by Damaris Schooner, RN Outcome: Progressing   Problem: Safety: Goal: Ability to remain free from injury will improve 08/23/2021 1128 by Damaris Schooner, RN Outcome: Progressing 08/23/2021 1127 by Damaris Schooner, RN Outcome: Progressing   Problem: Skin Integrity: Goal: Risk for impaired skin integrity will decrease 08/23/2021 1128 by Damaris Schooner, RN Outcome: Progressing 08/23/2021 1127 by Damaris Schooner, RN Outcome: Progressing

## 2021-08-24 NOTE — Progress Notes (Signed)
PROGRESS NOTE    ASHAKI FROSCH  OIZ:[id number] DOB: 1956-01-17 DOA: 08/16/2021 PCP: Irving Shows, PA-C   Chief Complaint  Patient presents with   Code Sepsis  Brief Narrative/Hospital Course: [place], 65 y.o. female with PMH of COPD chronic hypoxia on 2 L nasal cannula, OSA, lung cancer status postchemotherapy and surgery 2 years ago recently diagnosed with flu like symptoms 12/3 positive for influenza chest x-ray no pneumonia discharged home returns to the ED on 12/8 with worsening shortness of breath cough fever and abdominal pain. On EMS saturating 87% on room air improved to 94% on 2 L febrile 100.7 tachycardic in 110 tachypneic in the mid to high 20s influenza A positive COVID-19 negative, chest x-ray emphysema and likely superimposed bilateral lower lobe infection/inflammation-she was admitted for sepsis with influenza and pneumonia along with COPD exacerbation acute on chronic hypoxic respiratory failure. Patient has been on high flow nasal cannula since 12/11 night and is being managed with broad-spectrum antibiotics, pulmonary toileting, steroids. Patient has slowly improved and at this time clinically improved afebrile oxygen weaned down nicely.  Working with PT OT no shortness of breath, wheezing and cough has improved. Once oxygen went down to home setting 2 L we will plan for discharge home with outpatient follow-up with CBC BMP chest x-ray and pulmonary follow-up   Subjective: Seen and examined this morning On the bedside chair worked with PT OT Overnight no fever had episode of chest pain with coughing resolved with Maalox Still on 5 L nasal cannula I went down to 3 L.  Assessment & Plan: Sepsis POA Influenza A with pneumonia Multifocal pneumonia: Sepsis in the setting of influenza A infection and superimposed bacterial pneumonia chest x-ray 12/12 with progressive multifocal pneumonia, possible postviral bacterial infection.  MRSA negative seen by pulmonary  managed with broad-spectrum antibiotics-vancomycin discontinued 12/14 per PCCM,continue ceftriaxone, azithromycin.  Blood culture no growth so far, overall clinically improved, oxygen is being weaned down and if does well will discharge home with oral steroid taper along with her home bronchodilators.  Follow-up with CBC BMP in 1 week and chest x-ray in 4 wks.  Acute on chronic hypoxic respiratory failure Acute COPD exacerbation: normally on 2 L [place], it is  multifactorial with due to COPD exacerbation multifocal pneumonia.  needing HFNC up to 10 L, and has been nicely weaned down to 3 L, normally on 2 L at home and will transition to 2 L prior to discharge.continue to wean as tolerated.  Continue oral steroid, bronchodilators and supplemental oxygen  Hyperglycemia A1c 5.9, prediabetes, on steroid, monitor CBG outpatient follow-up Mild hyponatremia: Improved Hypokalemia/hypomagnesemia: Resolved  Right flank pain/mild right-sided hydro ureteral nephrosis History of right ureteral stone Low back pain:: Seen by Dr. Tresa Moore from urology, unclear if chronic from prior stone or perhaps indicative of some very mild partial obstruction from stricture , continue Flomax, no intervention recommended-patient will need outpatient follow-up, urology may consider repeat elective diagnostic ureteroscopy or nuclear medicine renogram to rule out significant obstruction if persist in outpatient setting.  X-ray lumbar spine no acute finding.  Pain is resolved.  GERD continue with PPI and H2 blocker HLD on statin Hypothyroidism with history of thyroidectomy, on Synthroid Anxiety/depression/bipolar disorder: stable, at times anxious-continue her  home meds  Nutrition Problem: Increased nutrient needs Etiology: acute illness (sepsis secondary to influenza A pneumonia) Signs/Symptoms: estimated needs Interventions: Ensure Enlive (each supplement provides 350kcal and 20 grams of protein), MVI, Liberalize Diet   DVT  prophylaxis: She refused  Lovenox or heparin injection and subsequently agreed for po rivaroxaban (XARELTO) tablet 10 mg Start: 08/22/21 1445 Place and maintain sequential compression device Start: 08/21/21 1430 Code Status:   Code Status: DNR Family Communication: plan of care discussed with patient at bedside. Status is: Inpatient Remains inpatient appropriate because: For ongoing management of pneumonia respiratory failure Disposition: Currently not medically stable for discharge. Anticipated Disposition: IncreasePT OT evaluation for disposition.  Objective: Vitals last 24 hrs: Vitals:   08/23/21 0754 08/23/21 1416 08/23/21 1939 08/23/21 2000  BP: 134/84 117/74  (!) 142/92  Pulse: 85 85  91  Resp: 18   18  Temp: 98.4 F (36.9 C) 98.2 F (36.8 C)  98 F (36.7 C)  TempSrc: Oral Oral    SpO2: 98% 97% 98% 98%  Weight:      Height:       Weight change:   Intake/Output Summary (Last 24 hours) at 08/24/2021 0751 Last data filed at 08/24/2021 0148 Gross per 24 hour  Intake 220 ml  Output 400 ml  Net -180 ml    Net IO Since Admission: 4,926.12 mL [08/24/21 0751]   Physical Examination: General exam: AAOx 3, thin, old than stated age, weak appearing. HEENT:Oral mucosa moist, Ear/Nose WNL grossly, dentition normal. Respiratory system: bilaterally clear breath sounds,no use of accessory muscle Cardiovascular system: S1 & S2 +, No JVD,. Gastrointestinal system: Abdomen soft, NT,ND, BS+ Nervous System:Alert, awake, moving extremities and grossly nonfocal Extremities: no edema, distal peripheral pulses palpable.  Skin: No rashes,no icterus. MSK: Normal muscle bulk,tone, power   Medications reviewed:  Scheduled Meds:  busPIRone  5 mg Oral TID   famotidine  40 mg Oral Daily   feeding supplement  237 mL Oral BID BM   guaiFENesin  600 mg Oral BID   levothyroxine  137 mcg Oral QAC breakfast   lidocaine  1 patch Transdermal Q24H   mometasone-formoterol  2 puff Inhalation BID    multivitamin with minerals  1 tablet Oral Daily   oxybutynin  5 mg Oral TID   pantoprazole  40 mg Oral Daily   polyethylene glycol  17 g Oral BID   predniSONE  30 mg Oral Q breakfast   QUEtiapine  300 mg Oral QHS   rivaroxaban  10 mg Oral Daily   rosuvastatin  20 mg Oral Daily   sertraline  100 mg Oral BID   tamsulosin  0.4 mg Oral Daily   venlafaxine XR  75 mg Oral Q breakfast   Continuous Infusions:  azithromycin 500 mg (08/23/21 0826)   cefTRIAXone (ROCEPHIN)  IV Stopped (08/23/21 1718)    Diet Order             Diet regular Room service appropriate? Yes; Fluid consistency: Thin  Diet effective now                   Nutrition Problem: Increased nutrient needs Etiology: acute illness (sepsis secondary to influenza A pneumonia) Signs/Symptoms: estimated needs Interventions: Ensure Enlive (each supplement provides 350kcal and 20 grams of protein), MVI, Liberalize Diet  Weight change:   Wt Readings from Last 3 Encounters:  08/16/21 45.4 kg  05/03/21 43.5 kg  04/09/20 (!) 40.8 kg     Consultants:see note  Procedures:see note Antimicrobials: Anti-infectives (From admission, onward)    Start     Dose/Rate Route Frequency Ordered Stop   08/21/21 2200  vancomycin (VANCOREADY) IVPB 750 mg/150 mL  Status:  Discontinued        750 mg 150  mL/hr over 60 Minutes Intravenous Every 24 hours 08/20/21 1210 08/22/21 1310   08/20/21 2030  vancomycin (VANCOCIN) IVPB 1000 mg/200 mL premix        1,000 mg 200 mL/hr over 60 Minutes Intravenous  Once 08/20/21 1932 08/20/21 2306   08/20/21 1300  vancomycin (VANCOCIN) IVPB 1000 mg/200 mL premix  Status:  Discontinued        1,000 mg 200 mL/hr over 60 Minutes Intravenous  Once 08/20/21 1210 08/20/21 1932   08/20/21 1100  azithromycin (ZITHROMAX) 500 mg in sodium chloride 0.9 % 250 mL IVPB        500 mg 250 mL/hr over 60 Minutes Intravenous Every 24 hours 08/20/21 1005     08/19/21 1800  cefTRIAXone (ROCEPHIN) 2 g in sodium chloride  0.9 % 100 mL IVPB        2 g 200 mL/hr over 30 Minutes Intravenous Every 24 hours 08/19/21 1039     08/17/21 2000  vancomycin (VANCOREADY) IVPB 750 mg/150 mL  Status:  Discontinued        750 mg 150 mL/hr over 60 Minutes Intravenous Every 24 hours 08/16/21 2023 08/17/21 0857   08/17/21 0600  ceFEPIme (MAXIPIME) 2 g in sodium chloride 0.9 % 100 mL IVPB  Status:  Discontinued        2 g 200 mL/hr over 30 Minutes Intravenous Every 12 hours 08/16/21 2301 08/19/21 1039   08/16/21 2315  oseltamivir (TAMIFLU) capsule 30 mg        30 mg Oral 2 times daily 08/16/21 2302 08/21/21 0859   08/16/21 2300  oseltamivir (TAMIFLU) capsule 75 mg  Status:  Discontinued        75 mg Oral 2 times daily 08/16/21 2253 08/16/21 2302   08/16/21 2015  vancomycin (VANCOCIN) IVPB 1000 mg/200 mL premix        1,000 mg 200 mL/hr over 60 Minutes Intravenous  Once 08/16/21 2013 08/16/21 2202   08/16/21 1745  cefTRIAXone (ROCEPHIN) 2 g in sodium chloride 0.9 % 100 mL IVPB  Status:  Discontinued        2 g 200 mL/hr over 30 Minutes Intravenous Every 24 hours 08/16/21 1730 08/16/21 1734   08/16/21 1745  azithromycin (ZITHROMAX) 500 mg in sodium chloride 0.9 % 250 mL IVPB  Status:  Discontinued        500 mg 250 mL/hr over 60 Minutes Intravenous Every 24 hours 08/16/21 1730 08/16/21 1734   08/16/21 1745  ceFEPIme (MAXIPIME) 1 g in sodium chloride 0.9 % 100 mL IVPB  Status:  Discontinued        1 g 200 mL/hr over 30 Minutes Intravenous  Once 08/16/21 1734 08/16/21 1742   08/16/21 1745  ceFEPIme (MAXIPIME) 2 g in sodium chloride 0.9 % 100 mL IVPB        2 g 200 mL/hr over 30 Minutes Intravenous  Once 08/16/21 1742 08/16/21 1926      Culture/Microbiology    Component Value Date/Time   SDES EXPECTORATED SPUTUM 08/21/2021 1056   [place] 08/21/2021 1056   CULT  08/17/2021 0216    NO GROWTH 5 DAYS Performed at [facility], [place] [address], [place], [place] [address]    REPTSTATUS 08/22/2021 FINAL  08/21/2021 1056    Other culture-see note  Unresulted Labs (From admission, onward)    None     Data Reviewed: I have personally reviewed following labs and imaging studies CBC: Recent Labs  Lab 08/18/21 0207 08/20/21 0235 08/21/21 0331  08/22/21 0215 08/23/21 0103  WBC 10.5 16.7* 14.0* 13.6* 13.6*  NEUTROABS 10.1* 15.2*  --   --   --   HGB 10.7* 9.7* 10.1* 10.6* 10.3*  HCT 32.1* 29.7* 30.9* 32.3* 31.5*  MCV 86.8 87.6 86.6 86.8 87.3  PLT 296 376 395 434* 437*    Basic Metabolic Panel: Recent Labs  Lab 08/18/21 0207 08/19/21 0149 08/20/21 0235 08/21/21 0331  NA 137 141 140 138  K 3.7 3.5 3.7 3.9  CL 102 106 106 103  CO2 26 25 26 27   GLUCOSE 156* 130* 80 72  BUN 9 16 24* 15  CREATININE 0.58 0.57 0.70 0.58  CALCIUM 8.2* 8.6* 8.6* 8.6*  MG 2.1 2.2  --   --     GFR: Estimated Creatinine Clearance: 51.6 mL/min (by C-G formula based on SCr of 0.58 mg/dL). Liver Function Tests: No results for input(s): AST, ALT, ALKPHOS, BILITOT, PROT, ALBUMIN in the last 168 hours.  No results for input(s): LIPASE, AMYLASE in the last 168 hours.  No results for input(s): AMMONIA in the last 168 hours. Coagulation Profile: No results for input(s): INR, PROTIME in the last 168 hours. Cardiac Enzymes: No results for input(s): CKTOTAL, CKMB, CKMBINDEX, TROPONINI in the last 168 hours. BNP (last 3 results) No results for input(s): PROBNP in the last 8760 hours. HbA1C: No results for input(s): HGBA1C in the last 72 hours.  CBG: No results for input(s): GLUCAP in the last 168 hours. Lipid Profile: No results for input(s): CHOL, HDL, LDLCALC, TRIG, CHOLHDL, LDLDIRECT in the last 72 hours. Thyroid Function Tests: No results for input(s): TSH, T4TOTAL, FREET4, T3FREE, THYROIDAB in the last 72 hours. Anemia Panel: No results for input(s): VITAMINB12, FOLATE, FERRITIN, TIBC, IRON, RETICCTPCT in the last 72 hours. Sepsis Labs: Recent Labs  Lab 08/18/21 0207 08/19/21 0149  08/22/21 0215 08/23/21 0103  PROCALCITON 0.64 0.46 <0.10 <0.10     Recent Results (from the past 240 hour(s))  Blood culture (routine x 2)     Status: None   Collection Time: 08/16/21  5:37 PM   Specimen: BLOOD  Result Value Ref Range Status   Specimen Description BLOOD SITE NOT SPECIFIED  Final   Special Requests   Final    BOTTLES DRAWN AEROBIC AND ANAEROBIC Blood Culture adequate volume   Culture   Final    NO GROWTH 5 DAYS Performed at [facility], [address], [place], [place] [address]    Report Status 08/21/2021 FINAL  Final  Resp Panel by RT-PCR (Flu A&B, Covid) Nasopharyngeal Swab     Status: Abnormal   Collection Time: 08/16/21  6:31 PM   Specimen: Nasopharyngeal Swab; Nasopharyngeal(NP) swabs in vial transport medium  Result Value Ref Range Status   SARS Coronavirus 2 by RT PCR NEGATIVE NEGATIVE Final    Comment: (NOTE) SARS-CoV-2 target nucleic acids are NOT DETECTED.  The SARS-CoV-2 RNA is generally detectable in upper respiratory specimens during the acute phase of infection. The lowest concentration of SARS-CoV-2 viral copies this assay can detect is 138 copies/mL. A negative result does not preclude SARS-Cov-2 infection and should not be used as the sole basis for treatment or other patient management decisions. A negative result may occur with  improper specimen collection/handling, submission of specimen other than nasopharyngeal swab, presence of viral mutation(s) within the areas targeted by this assay, and inadequate number of viral copies(<138 copies/mL). A negative result must be combined with clinical observations, patient history, and epidemiological information. The expected result  is Negative.  Fact Sheet for Patients:  EntrepreneurPulse.com.au  Fact Sheet for Healthcare Providers:  IncredibleEmployment.be  This test is no t yet approved or cleared by the [place] FDA and  has been authorized  for detection and/or diagnosis of SARS-CoV-2 by FDA under an Emergency Use Authorization (EUA). This EUA will remain  in effect (meaning this test can be used) for the duration of the COVID-19 declaration under Section 564(b)(1) of the Act, 21 U.S.C.section 360bbb-3(b)(1), unless the authorization is terminated  or revoked sooner.       Influenza A by PCR POSITIVE (A) NEGATIVE Final   Influenza B by PCR NEGATIVE NEGATIVE Final    Comment: (NOTE) The Xpert Xpress SARS-CoV-2/FLU/RSV plus assay is intended as an aid in the diagnosis of influenza from Nasopharyngeal swab specimens and should not be used as a sole basis for treatment. Nasal washings and aspirates are unacceptable for Xpert Xpress SARS-CoV-2/FLU/RSV testing.  Fact Sheet for Patients: EntrepreneurPulse.com.au  Fact Sheet for Healthcare Providers: IncredibleEmployment.be  This test is not yet approved or cleared by the [place] FDA and has been authorized for detection and/or diagnosis of SARS-CoV-2 by FDA under an Emergency Use Authorization (EUA). This EUA will remain in effect (meaning this test can be used) for the duration of the COVID-19 declaration under Section 564(b)(1) of the Act, 21 U.S.C. section 360bbb-3(b)(1), unless the authorization is terminated or revoked.  Performed at [facility], [place] [address], [place], [place] [address]   MRSA Next Gen by PCR, Nasal     Status: None   Collection Time: 08/16/21 10:52 PM   Specimen: Nasal Mucosa; Nasal Swab  Result Value Ref Range Status   MRSA by PCR Next Gen NOT DETECTED NOT DETECTED Final    Comment: (NOTE) The GeneXpert MRSA Assay (FDA approved for NASAL specimens only), is one component of a comprehensive MRSA colonization surveillance program. It is not intended to diagnose MRSA infection nor to guide or monitor treatment for MRSA infections. Test performance is not FDA approved in patients less than 53  years old. Performed at [facility], [place] [address] [place], [place] [address]   Blood culture (routine x 2)     Status: None   Collection Time: 08/17/21  2:16 AM   Specimen: BLOOD RIGHT HAND  Result Value Ref Range Status   Specimen Description BLOOD RIGHT HAND  Final   Special Requests   Final    BOTTLES DRAWN AEROBIC AND ANAEROBIC Blood Culture adequate volume   Culture   Final    NO GROWTH 5 DAYS Performed at [facility], [place] [address] [place], [place] [address]    Report Status 08/22/2021 FINAL  Final  Expectorated Sputum Assessment w Gram Stain, Rflx to Resp Cult     Status: None   Collection Time: 08/21/21 10:56 AM   Specimen: Expectorated Sputum  Result Value Ref Range Status   Specimen Description EXPECTORATED SPUTUM  Final   Special Requests NONE  Final   Sputum evaluation   Final    Sputum specimen not acceptable for testing.  Please recollect.   RESULT CALLED TO, READ BACK BY AND VERIFIED WITH: D LEE RN [id number] 08/22/21 A BROWNING Performed at [facility], [place] [address], [place], [place] [address]    Report Status 08/22/2021 FINAL  Final      Radiology Studies: No results found.   LOS: 8 days   Antonieta Pert, MD [facility]  08/24/2021, 7:51  AM

## 2021-08-24 NOTE — Evaluation (Signed)
+Physical Therapy Evaluation Patient Details Name: Tracy Park MRN: [id number] DOB: 12/31/1955 Today's Date: 08/24/2021  History of Present Illness  Pt. is 65 yr old F admitted on 08/16/21 with fever and worsening SOB. Recently diagnosed with flu A on 12/3.  Imaging (+) for PNA and emphysema.  PMH: anxiety, bipolar, lung CA, COPD, hypothyroid, MI, PTSD, thyroid disease  Clinical Impression  Pt was previously mod I with use of SPC.  Since being diagnosed with flu A/PNA, pt remains mod I, but feels safer using a RW.  Pt demos mod I with all bed mobility, transfers and gait and is not appropriate for skilled PT at this time.  Pt will be referred to mobility team to have continued opportunities to mobilize while in hospital.  Safe to return home on D/C, will need RW.       Recommendations for follow up therapy are one component of a multi-disciplinary discharge planning process, led by the attending physician.  Recommendations may be updated based on patient status, additional functional criteria and insurance authorization.  Follow Up Recommendations No PT follow up    Assistance Recommended at Discharge None  Functional Status Assessment Patient has had a recent decline in their functional status and demonstrates the ability to make significant improvements in function in a reasonable and predictable amount of time.  Equipment Recommendations  Rolling walker (2 wheels)    Recommendations for Other Services       Precautions / Restrictions Precautions Precautions: None Restrictions Weight Bearing Restrictions: No      Mobility  Bed Mobility Overal bed mobility: Modified Independent Bed Mobility: Sit to Supine     Supine to sit: Supervision Sit to supine: Modified independent (Device/Increase time)     Patient Response: Cooperative  Transfers Overall transfer level: Modified independent Equipment used: Rolling walker (2 wheels) Transfers: Sit to/from Stand;Bed to  chair/wheelchair/BSC Sit to Stand: Modified independent (Device/Increase time) Stand pivot transfers: Modified independent (Device/Increase time)         General transfer comment: Performs sit > stand and transfers onto commode with mod I.  Demos good balance with activity.    Ambulation/Gait Ambulation/Gait assistance: Modified independent (Device/Increase time) Gait Distance (Feet): 55 Feet Assistive device: Rolling walker (2 wheels)         General Gait Details: Amb laps in room with mod I.  Demos slow cadence but no LOB with activity.  O2 sats are monitored and noted to drop to 81% with activity on 3L O2; starting O2 level is 95% at rest.  Educated on PLB technique.  O2 sats return to 90%, NSG notified.  Recovery is slow and takes inc time.  Stairs            Wheelchair Mobility    Modified Rankin (Stroke Patients Only)       Balance Overall balance assessment: Modified Independent;No apparent balance deficits (not formally assessed) Sitting-balance support: Feet supported;No upper extremity supported Sitting balance-Leahy Scale: Good     Standing balance support: Reliant on assistive device for balance;During functional activity Standing balance-Leahy Scale: Poor                               Pertinent Vitals/Pain Pain Assessment: 0-10 Pain Score: 0-No pain    Home Living Family/patient expects to be discharged to:: Private residence Living Arrangements: Alone Available Help at Discharge: Family (Brother is coming into town to assist her) Type of Home: [employer]  Access: Stairs to enter Entrance Stairs-Rails: Right Entrance Stairs-Number of Steps: 3   Home Layout: One level Home Equipment: Cane - single point Additional Comments: wears 2L O2 at home    Prior Function Prior Level of Function : Independent/Modified Independent             Mobility Comments: States she would use SPC in home, rarely leaves home.  Would like to get a  RW ADLs Comments: completes ADLs and IADLs independently     Hand Dominance        Extremity/Trunk Assessment   Upper Extremity Assessment Upper Extremity Assessment: Defer to OT evaluation    Lower Extremity Assessment Lower Extremity Assessment: Overall WFL for tasks assessed    Cervical / Trunk Assessment Cervical / Trunk Assessment: Normal  Communication   Communication: No difficulties  Cognition Arousal/Alertness: Awake/alert Behavior During Therapy: WFL for tasks assessed/performed Overall Cognitive Status: Within Functional Limits for tasks assessed                                 General Comments: Pt. is sitting up in chair when PT arrives.  Alert and oriented x 3.  Agreeable to work with PT.  Hoping to D/C home soon.        General Comments General comments (skin integrity, edema, etc.): on 6L O2 throughout session, SpO2 90%    Exercises     Assessment/Plan    PT Assessment Patient does not need any further PT services (Will be referred to mobility team)  PT Problem List         PT Treatment Interventions      PT Goals (Current goals can be found in the Care Plan section)  Acute Rehab PT Goals Patient Stated Goal: Pt's goal is to return home PT Goal Formulation: With patient Time For Goal Achievement: 09/07/21 Potential to Achieve Goals: Good    Frequency     Barriers to discharge        Co-evaluation               AM-PAC PT "6 Clicks" Mobility  Outcome Measure Help needed turning from your back to your side while in a flat bed without using bedrails?: None Help needed moving from lying on your back to sitting on the side of a flat bed without using bedrails?: None Help needed moving to and from a bed to a chair (including a wheelchair)?: None Help needed standing up from a chair using your arms (e.g., wheelchair or bedside chair)?: None Help needed to walk in hospital room?: None Help needed climbing 3-5 steps with a  railing? : A Little 6 Click Score: 23    End of Session Equipment Utilized During Treatment: Gait belt Activity Tolerance: Patient tolerated treatment well Patient left: in bed;with call bell/phone within reach;with bed alarm set Nurse Communication: Mobility status PT Visit Diagnosis: Muscle weakness (generalized) (M62.81)    Time: 8280-0349 PT Time Calculation (min) (ACUTE ONLY): 28 min   Charges:   PT Evaluation $PT Eval Low Complexity: 1 Low PT Treatments $Therapeutic Activity: 8-22 mins       Genola Yuille A. Shantaya Bluestone, PT, DPT Acute Rehabilitation Services Office: [place] 08/24/2021, 11:04 AM

## 2021-08-24 NOTE — Progress Notes (Signed)
Physical Therapy Discharge Patient Details Name: Tracy Park MRN: [id number] DOB: Nov 14, 1955 Today's Date: 08/24/2021 Time: 4128-7867 PT Time Calculation (min) (ACUTE ONLY): 28 min  Patient discharged from PT services secondary to goals met and no further PT needs identified.  Please see latest therapy progress note for current level of functioning and progress toward goals.    Progress and discharge plan discussed with patient and/or caregiver: Patient/Caregiver agrees with plan  GP    [name] A. Keyshon Stein, PT, DPT Acute Rehabilitation Services Office: [place] 08/24/2021, 11:07 AM

## 2021-08-24 NOTE — Evaluation (Addendum)
Occupational Therapy Evaluation Patient Details Name: Tracy Park MRN: [id number] DOB: 10/14/1955 Today's Date: 08/24/2021   History of Present Illness Pt si a 65 y/o F presenting to ED on 12/11 for worsening SOB, cough, fever, and abdominal pain. Recent admission on 12/3 for influenza A. PMH includes bipolar disorder, anxiety, COPD, CAD, PTSD, GERD, and HLD.   Clinical Impression   Pt lives alone, independent at baseline with ADLs, uses [facility] for mobility. Has family nearby to provide PRN assistance. Pt reports wearing 2L O2 at home. Currently pt supervision - min guard for ADLs, supervision for bed mobility, and min guard for transfers with RW. Pt SpO2 above 90% throughout session on 5-6L O2. Cautious with household ambulation distance. Pt limited by impairments listed below, will continue to follow acutely. Recommend d/c home with assistance.      Recommendations for follow up therapy are one component of a multi-disciplinary discharge planning process, led by the attending physician.  Recommendations may be updated based on patient status, additional functional criteria and insurance authorization.   Follow Up Recommendations  No OT follow up    Assistance Recommended at Discharge Set up Supervision/Assistance  Functional Status Assessment  Patient has had a recent decline in their functional status and demonstrates the ability to make significant improvements in function in a reasonable and predictable amount of time.  Equipment Recommendations  Other (comment) (RW)    Recommendations for Other Services PT consult     Precautions / Restrictions Precautions Precautions: Fall Restrictions Weight Bearing Restrictions: No      Mobility Bed Mobility Overal bed mobility: Needs Assistance Bed Mobility: Supine to Sit     Supine to sit: Supervision          Transfers Overall transfer level: Needs assistance Equipment used: Rolling walker (2 wheels) Transfers: Sit  to/from Stand Sit to Stand: Min guard           General transfer comment: pt states she is cautious, decreased speed with household ambulation      Balance Overall balance assessment: Needs assistance Sitting-balance support: Feet supported;No upper extremity supported Sitting balance-Leahy Scale: Good     Standing balance support: Reliant on assistive device for balance;During functional activity Standing balance-Leahy Scale: Poor                             ADL either performed or assessed with clinical judgement   ADL Overall ADL's : Needs assistance/impaired Eating/Feeding: Set up;Sitting   Grooming: Set up;Sitting;Standing Grooming Details (indicate cue type and reason): performed standing grooming task at sink Upper Body Bathing: Supervision/ safety;Sitting   Lower Body Bathing: Supervison/ safety;Sitting/lateral leans;Sit to/from stand   Upper Body Dressing : Supervision/safety;Sitting   Lower Body Dressing: Supervision/safety;Sitting/lateral leans Lower Body Dressing Details (indicate cue type and reason): dons socks sitting EOB Toilet Transfer: Min guard;Ambulation;Regular Toilet;Rolling walker (2 wheels)   Toileting- Clothing Manipulation and Hygiene: Supervision/safety;Sit to/from stand       Functional mobility during ADLs: Min guard;Rolling walker (2 wheels)       Vision   Vision Assessment?: No apparent visual deficits     Perception     Praxis      Pertinent Vitals/Pain Pain Assessment: No/denies pain     Hand Dominance     Extremity/Trunk Assessment Upper Extremity Assessment Upper Extremity Assessment: Overall WFL for tasks assessed   Lower Extremity Assessment Lower Extremity Assessment: Defer to PT evaluation   Cervical / Trunk  Assessment Cervical / Trunk Assessment: Normal   Communication Communication Communication: No difficulties   Cognition Arousal/Alertness: Awake/alert Behavior During Therapy: WFL for tasks  assessed/performed Overall Cognitive Status: Within Functional Limits for tasks assessed                                       General Comments  on 6L O2 throughout session, SpO2 90%    Exercises     Shoulder Instructions      Home Living Family/patient expects to be discharged to:: Private residence Living Arrangements: Alone Available Help at Discharge: Family;Available PRN/intermittently;Other (Comment) (brother and girlfriend can help intermittently) Type of Home: Mobile home Home Access: Stairs to enter Entrance Stairs-Number of Steps: 4 Entrance Stairs-Rails: Right Home Layout: One level     Bathroom Shower/Tub: Walk-in shower;Tub/shower unit   Bathroom Toilet: Standard Bathroom Accessibility: Yes How Accessible: Accessible via walker Home Equipment: Cane - single point;Shower seat;BSC/3in1   Additional Comments: wears 2L O2 at home      Prior Functioning/Environment Prior Level of Function : Independent/Modified Independent;Driving             Mobility Comments: uses SPC for mobility ADLs Comments: completes ADLs and IADLs independently        OT Problem List: Decreased activity tolerance;Impaired balance (sitting and/or standing);Decreased range of motion;Decreased strength      OT Treatment/Interventions: Self-care/ADL training;Therapeutic exercise;Neuromuscular education;DME and/or AE instruction;Therapeutic activities;Balance training;Patient/family education    OT Goals(Current goals can be found in the care plan section) Acute Rehab OT Goals Patient Stated Goal: return home OT Goal Formulation: With patient Time For Goal Achievement: 09/07/21 Potential to Achieve Goals: Good ADL Goals Pt Will Perform Lower Body Bathing: with modified independence;sit to/from stand Pt Will Transfer to Toilet: with modified independence;ambulating;regular height toilet Pt Will Perform Tub/Shower Transfer: ambulating;3 in 1;with modified  independence  OT Frequency: Min 2X/week   Barriers to D/C:            Co-evaluation              AM-PAC OT "6 Clicks" Daily Activity     Outcome Measure Help from another person eating meals?: None Help from another person taking care of personal grooming?: None Help from another person toileting, which includes using toliet, bedpan, or urinal?: None Help from another person bathing (including washing, rinsing, drying)?: A Little Help from another person to put on and taking off regular upper body clothing?: None Help from another person to put on and taking off regular lower body clothing?: A Little 6 Click Score: 22   End of Session Equipment Utilized During Treatment: Gait belt;Rolling walker (2 wheels) Nurse Communication: Mobility status  Activity Tolerance: Patient tolerated treatment well Patient left: in chair;with call bell/phone within reach;with chair alarm set;with nursing/sitter in room  OT Visit Diagnosis: Unsteadiness on feet (R26.81);Other abnormalities of gait and mobility (R26.89);Muscle weakness (generalized) (M62.81)                Time: 4656-8127 OT Time Calculation (min): 34 min Charges:  OT General Charges $OT Visit: 1 Visit OT Evaluation $OT Eval Low Complexity: 1 Low OT Treatments $Self Care/Home Management : 8-22 mins  Lynnda Child, OTD, OTR/L Acute Rehab [phone] - [place] 08/24/2021, 10:02 AM

## 2021-08-24 NOTE — Progress Notes (Signed)
Pt is complaining of severe burning mid chest after coughing episode. Maalox 30 ml given po per on call provider, Noberto Retort, with full effects noted.

## 2021-08-25 ENCOUNTER — Inpatient Hospital Stay (HOSPITAL_COMMUNITY): Payer: Medicare Other

## 2021-08-25 DIAGNOSIS — M79602 Pain in left arm: Secondary | ICD-10-CM

## 2021-08-25 DIAGNOSIS — E440 Moderate protein-calorie malnutrition: Secondary | ICD-10-CM | POA: Insufficient documentation

## 2021-08-25 DIAGNOSIS — E43 Unspecified severe protein-calorie malnutrition: Secondary | ICD-10-CM | POA: Insufficient documentation

## 2021-08-25 MED ORDER — GUAIFENESIN ER 600 MG PO TB12
600.0000 mg | ORAL_TABLET | Freq: Two times a day (BID) | ORAL | 0 refills | Status: AC
Start: 2021-08-25 — End: 2021-09-01

## 2021-08-25 MED ORDER — AMOXICILLIN-POT CLAVULANATE 875-125 MG PO TABS: 1.0000 | ORAL_TABLET | Freq: Two times a day (BID) | ORAL | 0 refills | Status: AC

## 2021-08-25 MED ORDER — ENSURE ENLIVE PO LIQD: 237.0000 mL | Freq: Two times a day (BID) | ORAL | 0 refills | Status: AC

## 2021-08-25 MED ORDER — PREDNISONE 10 MG PO TABS: ORAL_TABLET | ORAL | 0 refills | Status: DC

## 2021-08-25 NOTE — Progress Notes (Addendum)
Occupational Therapy Treatment Patient Details Name: Tracy Park MRN: [id number] DOB: 12-01-1955 Today's Date: 08/25/2021   History of present illness Pt. is 65 yr old F admitted on 08/16/21 with fever and worsening SOB. Recently diagnosed with flu A on 12/3.  Imaging (+) for PNA and emphysema.  PMH: anxiety, bipolar, lung CA, COPD, hypothyroid, MI, PTSD, thyroid disease   OT comments  Pt received awake and alert, pleasant and agreeable to OT session. Pt is progressing toward established goals, however, still limited with activity tolerance and ADL function. During session, pt demonstrated min guard stand pivot to [facility] with request for toileting, session limited to [facility] transfer as opposed to toilet transfer due to desat at 83% on 2L, Pt instructed to perform pursed lip breathing techniques prior to transfer. Pt able to recover to 87%. Pt demonstrated mod I toileting while seated on BSC and set up grooming (washing hands - hand sanitizer). Pt will benefit to continue skilled level OT prior to DC to ensure safety and increased independence prior to DC. DC recommendation and frequency remains the same.    Recommendations for follow up therapy are one component of a multi-disciplinary discharge planning process, led by the attending physician.  Recommendations may be updated based on patient status, additional functional criteria and insurance authorization.    Follow Up Recommendations  No OT follow up    Assistance Recommended at Discharge Set up Supervision/Assistance  Equipment Recommendations  Other (comment) (RW)    Recommendations for Other Services PT consult    Precautions / Restrictions Precautions Precautions: None Restrictions Weight Bearing Restrictions: No       Mobility Bed Mobility Overal bed mobility: Modified Independent Bed Mobility: Sit to Supine     Supine to sit: Modified independent (Device/Increase time) Sit to supine: Modified independent (Device/Increase  time)   General bed mobility comments: Pt reminded to pace self when return back to bed from toileting due desating to 79% after toileting. Pt demonstrated pursed lip breathing while reclined in bed and recovered to 87% on 2L with ~1 min rest.    Transfers Overall transfer level: Modified independent Equipment used: Rolling walker (2 wheels) Transfers: Sit to/from Stand;Bed to chair/wheelchair/BSC Sit to Stand: Modified independent (Device/Increase time) Stand pivot transfers: Min guard               Balance Overall balance assessment: Modified Independent;No apparent balance deficits (not formally assessed) Sitting-balance support: Feet supported;No upper extremity supported Sitting balance-Leahy Scale: Good     Standing balance support: Reliant on assistive device for balance;During functional activity Standing balance-Leahy Scale: Fair                             ADL either performed or assessed with clinical judgement   ADL Overall ADL's : Needs assistance/impaired     Grooming: Set up;Sitting;Standing Grooming Details (indicate cue type and reason): pt given hand sanitizer after toileting and transfer from [facility].                 Toilet Transfer: Min guard;Ambulation;Rolling walker (2 wheels);BSC/3in1 Toilet Transfer Details (indicate cue type and reason): Transfer limited to [facility] due to desat to 83% on 2L O2, OT provided BSC and RW. Toileting- Clothing Manipulation and Hygiene: Supervision/safety;Sit to/from stand Toileting - Clothing Manipulation Details (indicate cue type and reason): Pt demonstrated pericare in sitting and standing with supervision. No instability observed, pt O2 range of 84-87%     Functional  mobility during ADLs: Min guard;Rolling walker (2 wheels) General ADL Comments: Pt educated and reminded on use of breathing technqiues and energy conservation strategies when engaging in ADLs to improve function and tolerance.     Extremity/Trunk Assessment Upper Extremity Assessment Upper Extremity Assessment: Overall WFL for tasks assessed   Lower Extremity Assessment Lower Extremity Assessment: Defer to PT evaluation        Vision   Vision Assessment?: No apparent visual deficits   Perception     Praxis      Cognition Arousal/Alertness: Awake/alert Behavior During Therapy: WFL for tasks assessed/performed Overall Cognitive Status: Within Functional Limits for tasks assessed                                 General Comments: Pt received awake and alert, pleasant and agreeable for OT session.          Exercises     Shoulder Instructions       General Comments 2L O2 throughout session, O2 range 79-88%    Pertinent Vitals/ Pain       Pain Assessment: No/denies pain  Home Living                                          Prior Functioning/Environment              Frequency  Min 2X/week        Progress Toward Goals  OT Goals(current goals can now be found in the care plan section)  Progress towards OT goals: Progressing toward goals  Acute Rehab OT Goals Patient Stated Goal: to return home OT Goal Formulation: With patient Time For Goal Achievement: 09/07/21 Potential to Achieve Goals: Good ADL Goals Pt Will Perform Lower Body Bathing: with modified independence;sit to/from stand Pt Will Transfer to Toilet: with modified independence;ambulating;regular height toilet Pt Will Perform Tub/Shower Transfer: ambulating;3 in 1;with modified independence  Plan Discharge plan remains appropriate;Frequency remains appropriate    Co-evaluation                 AM-PAC OT "6 Clicks" Daily Activity     Outcome Measure   Help from another person eating meals?: None Help from another person taking care of personal grooming?: None Help from another person toileting, which includes using toliet, bedpan, or urinal?: None Help from another person  bathing (including washing, rinsing, drying)?: A Little Help from another person to put on and taking off regular upper body clothing?: None Help from another person to put on and taking off regular lower body clothing?: A Little 6 Click Score: 22    End of Session Equipment Utilized During Treatment: Gait belt;Rolling walker (2 wheels)  OT Visit Diagnosis: Unsteadiness on feet (R26.81);Other abnormalities of gait and mobility (R26.89);Muscle weakness (generalized) (M62.81)   Activity Tolerance Patient tolerated treatment well   Patient Left with call bell/phone within reach;with nursing/sitter in room;in bed   Nurse Communication Mobility status        Time: 8466-5993 OT Time Calculation (min): 18 min  Charges: OT General Charges $OT Visit: 1 Visit OT Treatments $Self Care/Home Management : 8-22 mins  Minus Breeding, MSOT, OTR/L  Supplemental Rehabilitation Services  [phone]   Marius Ditch 08/25/2021, 1:20 PM

## 2021-08-25 NOTE — Discharge Summary (Addendum)
Physician Discharge Summary  Tracy Park RCV:[id number] DOB: Dec 29, 1955 DOA: 08/16/2021  PCP: Irving Shows, PA-C  Admit date: 08/16/2021 Discharge date: 08/25/2021  Admitted From: home Disposition:  hh  Recommendations for Outpatient Follow-up:  Follow up with PCP in 1-2 weeks Please obtain BMP/CBC in one week  Home Health:yes  Equipment/Devices: yes  Discharge Condition: Stable Code Status:   Code Status: Prior Diet recommendation:  Diet Order     None        Brief/Interim Summary:  65 y.o. female with PMH of COPD chronic hypoxia on 2 L nasal cannula, OSA, lung cancer status postchemotherapy and surgery 2 years ago recently diagnosed with flu like symptoms 12/3 positive for influenza chest x-ray no pneumonia discharged home returns to the ED on 12/8 with worsening shortness of breath cough fever and abdominal pain. On EMS saturating 87% on room air improved to 94% on 2 L febrile 100.7 tachycardic in 110 tachypneic in the mid to high 20s influenza A positive COVID-19 negative, chest x-ray emphysema and likely superimposed bilateral lower lobe infection/inflammation-she was admitted for sepsis with influenza and pneumonia along with COPD exacerbation acute on chronic hypoxic respiratory failure. Patient has been on high flow nasal cannula since 12/11 night and is being managed with broad-spectrum antibiotics, pulmonary toileting, steroids. Patient has slowly improved and at this time clinically improved afebrile oxygen weaned down nicely.  Working with PT OT no shortness of breath, wheezing and cough has improved. Oxygen weaned down to home setting 2 L--suspect patient may need to relator, patient normally at home has been running in 88% normally.  She has been ambulating well no dyspnea or shortness of breath and requesting for discharge home today.  Advised follow-up chest x-ray in 2 weeks.  Discharge Diagnoses:   Sepsis POA Influenza A with pneumonia Multifocal  pneumonia: Sepsis in the setting of influenza A infection and superimposed bacterial pneumonia chest x-ray 12/12 with progressive multifocal pneumonia, possible postviral bacterial infection.  MRSA negative seen by pulmonary managed with broad-spectrum antibiotics-vancomycin discontinued 12/14 per PCCM-continued on ceftriaxone, azithromycin.  Received 8 days of antibiotics I will discharge on p.o. antibiotics for a few more days at this time she is clinically improved she will need to go home on oxygen may need to bump up to 3 L at home she will continue with home nebulizer, steroid taper follow-up with CBC BMP and chest x-ray with PCP   Acute on chronic hypoxic respiratory failure Acute COPD exacerbation: normally on 2 L [place], it is  multifactorial with due to COPD exacerbation multifocal pneumonia.  needing HFNC up to 10 L-which has been weaned down doing well on 2 to 3 L at this time.  We will discharge her on steroid home bronchodilators and antibiotics.   Hyperglycemia A1c 5.9, prediabetes, on steroid, monitor CBG outpatient follow-up Mild hyponatremia: Improved Hypokalemia/hypomagnesemia: Resolved   Right flank pain/mild right-sided hydro ureteral nephrosis History of right ureteral stone Low back pain:: Seen by Dr. Tresa Moore from urology, unclear if chronic from prior stone or perhaps indicative of some very mild partial obstruction from stricture , continue Flomax, no intervention recommended-patient will need outpatient follow-up, urology may consider repeat elective diagnostic ureteroscopy or nuclear medicine renogram to rule out significant obstruction if persist in outpatient setting.  X-ray lumbar spine no acute finding.  Pain is essentially resolved.[name]   GERD continue with PPI and H2 blocker HLD on statin Hypothyroidism with history of thyroidectomy, on Synthroid Anxiety/depression/bipolar disorder: stable, at times anxious-continue her  home meds  Severe malnutrition: augment nutrition  status this in the setting of her chronic illness  Left upper extremity SVT Cephalic vein: area of redness firmness and duplex ordered -shows cephalic vein superficial vein thrombosis I discussed with Dr. Lorenso Courier from oncology - this is provoked SVT no indication for anticoagulation and he advised to continue supportive care.  Her pain and swelling is improving.  I have informed the patient if she has further swelling or painful upper extremity seek immediate medical care with PCP or ED  Consults: PCCM  Subjective: Alert awake oriented not in distress requesting for discharge home today. Discharge Exam: Vitals:   08/25/21 1041 08/25/21 1513  BP:  118/74  Pulse:  90  Resp:  17  Temp:  98.2 F (36.8 C)  SpO2: 91% 93%   General: Pt is alert, awake, not in acute distress Cardiovascular: RRR, S1/S2 +, no rubs, no gallops Respiratory: CTA bilaterally, no wheezing, no rhonchi Abdominal: Soft, NT, ND, bowel sounds + Extremities: no edema, no cyanosis  Discharge Instructions  Discharge Instructions     Discharge instructions   Complete by: As directed    Please call call MD or return to ER for similar or worsening recurring problem that brought you to hospital or if any fever,nausea/vomiting,abdominal pain, uncontrolled pain, chest pain,  shortness of breath or any other alarming symptoms.  Please follow-up your doctor as instructed in a week time and call the office for appointment-obtain chest x-ray in 2 weeks, CBC BMP in 1 week  Please avoid alcohol, smoking, or any other illicit substance and maintain healthy habits including taking your regular medications as prescribed.  You were cared for by a hospitalist during your hospital stay. If you have any questions about your discharge medications or the care you received while you were in the hospital after you are discharged, you can call the unit and ask to speak with the hospitalist on call if the hospitalist that took care of you is  not available.  Once you are discharged, your primary care physician will handle any further medical issues. Please note that NO REFILLS for any discharge medications will be authorized once you are discharged, as it is imperative that you return to your primary care physician (or establish a relationship with a primary care physician if you do not have one) for your aftercare needs so that they can reassess your need for medications and monitor your lab values   Increase activity slowly   Complete by: As directed       Allergies as of 08/25/2021       Reactions   Red Dye Hives, Itching   Oxycontin [oxycodone Hcl]    Other reaction(s): Hallucinations   Aspirin Hives   Tape Rash   ONLY USE PAPER TAPE        Medication List     TAKE these medications    albuterol (2.5 MG/3ML) 0.083% nebulizer solution Commonly known as: PROVENTIL Take 2.5 mg by nebulization every 6 (six) hours as needed for wheezing or shortness of breath.   albuterol 108 (90 Base) MCG/ACT inhaler Commonly known as: VENTOLIN HFA Inhale 2 puffs into the lungs every 6 (six) hours as needed for wheezing or shortness of breath.   amoxicillin-clavulanate 875-125 MG tablet Commonly known as: Augmentin Take 1 tablet by mouth 2 (two) times daily for 3 days.   budesonide-formoterol 160-4.5 MCG/ACT inhaler Commonly known as: SYMBICORT Inhale 1 puff into the lungs in the morning and at bedtime.  busPIRone 5 MG tablet Commonly known as: BUSPAR Take 5 mg by mouth 3 (three) times daily.   Centrum Women Tabs Take 1 tablet by mouth daily.   Cholecalciferol 25 MCG (1000 UT) tablet Take 1,000 Units by mouth daily.   desvenlafaxine 50 MG 24 hr tablet Commonly known as: PRISTIQ Take 50 mg by mouth daily.   famotidine 40 MG tablet Commonly known as: PEPCID Take 40 mg by mouth daily.   feeding supplement Liqd Take 237 mLs by mouth 2 (two) times daily between meals for 7 days.   guaiFENesin 600 MG 12 hr  tablet Commonly known as: MUCINEX Take 1 tablet (600 mg total) by mouth 2 (two) times daily for 7 days.   levothyroxine 137 MCG tablet Commonly known as: SYNTHROID Take 137 mcg by mouth daily before breakfast.   meloxicam 15 MG tablet Commonly known as: MOBIC Take 15 mg by mouth daily.   methocarbamol 500 MG tablet Commonly known as: ROBAXIN Take 500 mg by mouth daily.   nitroGLYCERIN 0.4 MG SL tablet Commonly known as: NITROSTAT Place 0.4 mg under the tongue every 5 (five) minutes as needed for chest pain.   omeprazole 20 MG capsule Commonly known as: PRILOSEC Take 40 mg by mouth daily.   ondansetron 4 MG tablet Commonly known as: ZOFRAN Take 4 mg by mouth daily.   oxybutynin 5 MG tablet Commonly known as: DITROPAN Take 5 mg by mouth 3 (three) times daily.   OXYGEN Inhale 2 L into the lungs continuous.   predniSONE 10 MG tablet Commonly known as: DELTASONE 3 tabs daily x 3 days,2 tabs daily x 3 days,1 tab daily x 3 days then stop.   QUEtiapine 300 MG tablet Commonly known as: SEROQUEL Take 300 mg by mouth at bedtime.   rosuvastatin 20 MG tablet Commonly known as: CRESTOR Take 20 mg by mouth daily.   senna 8.6 MG Tabs tablet Commonly known as: SENOKOT Take 1 tablet by mouth 2 (two) times daily as needed for mild constipation.   sertraline 100 MG tablet Commonly known as: ZOLOFT Take 100 mg by mouth in the morning and at bedtime.   tamsulosin 0.4 MG Caps capsule Commonly known as: FLOMAX Take 0.4 mg by mouth daily.        Follow-up Information     Irving Shows, PA-C Follow up.   Specialty: Physician Assistant Contact information: [place] [address] [phone]                Allergies  Allergen Reactions   Red Dye Hives and Itching   Oxycontin [Oxycodone Hcl]     Other reaction(s): Hallucinations   Aspirin Hives   Tape Rash    ONLY USE PAPER TAPE    The results of significant diagnostics from  this hospitalization (including imaging, microbiology, ancillary and laboratory) are listed below for reference.    Microbiology: Recent Results (from the past 240 hour(s))  Blood culture (routine x 2)     Status: None   Collection Time: 08/16/21  5:37 PM   Specimen: BLOOD  Result Value Ref Range Status   Specimen Description BLOOD SITE NOT SPECIFIED  Final   Special Requests   Final    BOTTLES DRAWN AEROBIC AND ANAEROBIC Blood Culture adequate volume   Culture   Final    NO GROWTH 5 DAYS Performed at [facility], [address] [place], [place] [address]    Report Status 08/21/2021 FINAL  Final  Resp Panel by RT-PCR (Flu A&B, Covid) Nasopharyngeal Swab     Status: Abnormal   Collection Time: 08/16/21  6:31 PM   Specimen: Nasopharyngeal Swab; Nasopharyngeal(NP) swabs in vial transport medium  Result Value Ref Range Status   SARS Coronavirus 2 by RT PCR NEGATIVE NEGATIVE Final    Comment: (NOTE) SARS-CoV-2 target nucleic acids are NOT DETECTED.  The SARS-CoV-2 RNA is generally detectable in upper respiratory specimens during the acute phase of infection. The lowest concentration of SARS-CoV-2 viral copies this assay can detect is 138 copies/mL. A negative result does not preclude SARS-Cov-2 infection and should not be used as the sole basis for treatment or other patient management decisions. A negative result may occur with  improper specimen collection/handling, submission of specimen other than nasopharyngeal swab, presence of viral mutation(s) within the areas targeted by this assay, and inadequate number of viral copies(<138 copies/mL). A negative result must be combined with clinical observations, patient history, and epidemiological information. The expected result is Negative.  Fact Sheet for Patients:  EntrepreneurPulse.com.au  Fact Sheet for Healthcare Providers:  IncredibleEmployment.be  This test is no t yet approved or  cleared by the [place] FDA and  has been authorized for detection and/or diagnosis of SARS-CoV-2 by FDA under an Emergency Use Authorization (EUA). This EUA will remain  in effect (meaning this test can be used) for the duration of the COVID-19 declaration under Section 564(b)(1) of the Act, 21 U.S.C.section 360bbb-3(b)(1), unless the authorization is terminated  or revoked sooner.       Influenza A by PCR POSITIVE (A) NEGATIVE Final   Influenza B by PCR NEGATIVE NEGATIVE Final    Comment: (NOTE) The Xpert Xpress SARS-CoV-2/FLU/RSV plus assay is intended as an aid in the diagnosis of influenza from Nasopharyngeal swab specimens and should not be used as a sole basis for treatment. Nasal washings and aspirates are unacceptable for Xpert Xpress SARS-CoV-2/FLU/RSV testing.  Fact Sheet for Patients: EntrepreneurPulse.com.au  Fact Sheet for Healthcare Providers: IncredibleEmployment.be  This test is not yet approved or cleared by the [place] FDA and has been authorized for detection and/or diagnosis of SARS-CoV-2 by FDA under an Emergency Use Authorization (EUA). This EUA will remain in effect (meaning this test can be used) for the duration of the COVID-19 declaration under Section 564(b)(1) of the Act, 21 U.S.C. section 360bbb-3(b)(1), unless the authorization is terminated or revoked.  Performed at [facility], [place] [address], [place], [place] [address]   MRSA Next Gen by PCR, Nasal     Status: None   Collection Time: 08/16/21 10:52 PM   Specimen: Nasal Mucosa; Nasal Swab  Result Value Ref Range Status   MRSA by PCR Next Gen NOT DETECTED NOT DETECTED Final    Comment: (NOTE) The GeneXpert MRSA Assay (FDA approved for NASAL specimens only), is one component of a comprehensive MRSA colonization surveillance program. It is not intended to diagnose MRSA infection nor to guide or monitor treatment for MRSA infections. Test  performance is not FDA approved in patients less than 65 years old. Performed at [facility], [place] [address] [place], [place] [address]   Blood culture (routine x 2)     Status: None   Collection Time: 08/17/21  2:16 AM   Specimen: BLOOD RIGHT HAND  Result Value Ref Range Status   Specimen Description BLOOD RIGHT HAND  Final   Special Requests   Final    BOTTLES DRAWN AEROBIC AND ANAEROBIC Blood Culture adequate  volume   Culture   Final    NO GROWTH 5 DAYS Performed at [facility], [place] [address] [place], [place] [address]    Report Status 08/22/2021 FINAL  Final  Expectorated Sputum Assessment w Gram Stain, Rflx to Resp Cult     Status: None   Collection Time: 08/21/21 10:56 AM   Specimen: Expectorated Sputum  Result Value Ref Range Status   Specimen Description EXPECTORATED SPUTUM  Final   Special Requests NONE  Final   Sputum evaluation   Final    Sputum specimen not acceptable for testing.  Please recollect.   RESULT CALLED TO, READ BACK BY AND VERIFIED WITH: D LEE RN [id number] 08/22/21 A BROWNING Performed at [facility], [place] [address] [place], [place] [address]    Report Status 08/22/2021 FINAL  Final    Procedures/Studies: DG Chest 2 View  Result Date: 08/11/2021 CLINICAL DATA:  Shortness of breath, cough and fever. EXAM: CHEST - 2 VIEW COMPARISON:  Chest x-rays dated 05/02/2021 and 04/09/2020 FINDINGS: Heart size and mediastinal contours are stable. Chronic scarring/fibrosis bilaterally. No confluent opacity to suggest a developing pneumonia. No pleural effusion or pneumothorax is seen. No acute-appearing osseous abnormality. IMPRESSION: 1. No active cardiopulmonary disease. No evidence of pneumonia or pulmonary edema. 2. Chronic scarring/fibrosis within each lung. Lungs are hyperexpanded suggesting COPD. Electronically Signed   By: Franki Cabot M.D.   On: 08/11/2021 09:57   DG Lumbar Spine 2-3 Views  Result Date: 08/20/2021 CLINICAL DATA:  Back pain.  EXAM: LUMBAR SPINE - 2-3 VIEW COMPARISON:  Lumbar spine radiograph dated 08/09/2015. FINDINGS: No acute fracture or subluxation of the lumbar spine. There is straightening of normal lumbar lordosis. The L5 is sacralized. There is a grade 1 L4-L5 anterolisthesis. The visualized posterior elements are intact. Lower lumbar facet arthropathy. The soft tissues are unremarkable. IMPRESSION: No acute fracture or subluxation of the lumbar spine. Electronically Signed   By: Anner Crete M.D.   On: 08/20/2021 21:10   CT ABDOMEN PELVIS W CONTRAST  Result Date: 08/16/2021 CLINICAL DATA:  Sepsis, acute abdominal pain. History of lung cancer. EXAM: CT ABDOMEN AND PELVIS WITH CONTRAST TECHNIQUE: Multidetector CT imaging of the abdomen and pelvis was performed using the standard protocol following bolus administration of intravenous contrast. CONTRAST:  58mL OMNIPAQUE IOHEXOL 300 MG/ML  SOLN COMPARISON:  CT renal stone 05/03/2021. FINDINGS: Lower chest: There are patchy multifocal ground-glass and airspace opacities within the lung bases, left greater than right. Hepatobiliary: No focal liver abnormality is seen. No gallstones, gallbladder wall thickening, or biliary dilatation. Pancreas: Unremarkable. No pancreatic ductal dilatation or surrounding inflammatory changes. Spleen: Normal in size without focal abnormality. Adrenals/Urinary Tract: There is mild right-sided hydroureteronephrosis. No definite obstructing calculus identified. Bladder, left kidney and adrenal glands are within normal limits. Stomach/Bowel: Stomach is within normal limits. Appendix is not seen. No evidence of bowel wall thickening, distention, or inflammatory changes. Vascular/Lymphatic: Aortic atherosclerosis. No enlarged abdominal or pelvic lymph nodes. Reproductive: Status post hysterectomy. No adnexal masses. Other: There is trace free fluid in the pelvis. There is no focal abdominal wall hernia. Musculoskeletal: There is stable grade 1  anterolisthesis at L4-L5, likely degenerative. IMPRESSION: 1. Bilateral lower lung multifocal ground-glass and airspace opacities worrisome for infection. 2. Mild right-sided hydroureteronephrosis without obstructing calculus identified. Findings may be related to recently passed calculus (nonvisualized) or ureteral stricture/mass. Recommend clinical correlation and follow-up. 3. Trace free fluid in the pelvis. 4.  Aortic Atherosclerosis (ICD10-I70.0). Electronically Signed  By: Ronney Asters M.D.   On: 08/16/2021 20:44   US RENAL  Result Date: 08/19/2021 CLINICAL DATA:  Hydronephrosis. EXAM: RENAL / URINARY TRACT ULTRASOUND COMPLETE COMPARISON:  CT AP, 08/16/2021 and 05/03/2021. US Abdomen, 05/03/2021. FINDINGS: Right Kidney: Renal measurements: 12.4 x 4.0 x 6.1 cm = volume: 160 mL. Echogenicity within normal limits. No mass is visualized. Mild-to-moderate volume of hydronephrosis is present. Left Kidney: Renal measurements: 11.8 x 4.0 x 4.4 cm = volume: 107 mL. Echogenicity within normal limits. No mass or hydronephrosis visualized. Bladder: Appears normal for degree of bladder distention. Other: No ascites IMPRESSION: 1. Mild-to-moderate volume RIGHT hydronephrosis. 2. Normal LEFT renal collecting system. Electronically Signed   By: Michaelle Birks M.D.   On: 08/19/2021 11:24   DG Chest Port 1 View  Result Date: 08/20/2021 CLINICAL DATA:  Shortness of breath, code sepsis, history of the flu, lung cancer in the past on LEFT EXAM: PORTABLE CHEST 1 VIEW COMPARISON:  Portable exam 0846 hours compared to 08/16/2021 FINDINGS: Stable heart size. Postsurgical changes and volume loss LEFT upper lung related to prior lung cancer therapy. Progressive airspace consolidation RIGHT upper lobe and LEFT lower lobe consistent with multifocal pneumonia. Underlying emphysematous changes. Trace LEFT pleural effusion. No pneumothorax. IMPRESSION: Progressive BILATERAL pulmonary infiltrates consistent with multifocal  pneumonia. Emphysematous changes with postsurgical changes, volume loss, and scarring in LEFT upper lobe. Electronically Signed   By: Lavonia Dana M.D.   On: 08/20/2021 09:06   DG Chest Port 1 View  Result Date: 08/16/2021 CLINICAL DATA:  Shortness of breath and fever. History of lung cancer EXAM: PORTABLE CHEST 1 VIEW COMPARISON:  Chest x-ray 08/11/2021, CT chest 04/09/2020 FINDINGS: The heart and mediastinal contours are within normal limits. Right to left mediastinal shift. Left lung surgical changes. Biapical pleural/pulmonary scarring. No focal consolidation. Chronic coarsened interstitial markings with overlying patchy airspace opacity along the left lower lobe. Similar finding along the right lower lobe. Persistent blunting of the left costophrenic angle with possible overlying trace left pleural effusion. No right pleural effusion. No acute osseous abnormality. IMPRESSION: Emphysema (YBO17-P10.9) with liky superimposed bilateral lower lobe infection/inflammation. Possible trace left pleural effusion. Electronically Signed   By: Iven Finn M.D.   On: 08/16/2021 17:50   VAS [place] LOWER EXTREMITY VENOUS (DVT)  Result Date: 08/21/2021  Lower Venous DVT Study Patient Name:  Tracy Park  Date of Exam:   08/21/2021 Medical Rec #: [id number]         Accession #:    [id number] Date of Birth: 1956/03/14         Patient Gender: F Patient Age:   70 years Exam Location:  [facility] Procedure:      VAS [place] LOWER EXTREMITY VENOUS (DVT) Referring Phys: Ina Homes --------------------------------------------------------------------------------  Indications: Edema.  Comparison Study: no prior Performing Technologist: Archie Patten RVS  Examination Guidelines: A complete evaluation includes B-mode imaging, spectral Doppler, color Doppler, and power Doppler as needed of all accessible portions of each vessel. Bilateral testing is considered an integral part of a complete examination. Limited  examinations for reoccurring indications may be performed as noted. The reflux portion of the exam is performed with the patient in reverse Trendelenburg.  +---------+---------------+---------+-----------+----------+--------------+  RIGHT     Compressibility Phasicity Spontaneity Properties Thrombus Aging  +---------+---------------+---------+-----------+----------+--------------+  CFV       Full            Yes       Yes                                    +---------+---------------+---------+-----------+----------+--------------+  SFJ       Full                                                             +---------+---------------+---------+-----------+----------+--------------+  FV Prox   Full                                                             +---------+---------------+---------+-----------+----------+--------------+  FV Mid    Full                                                             +---------+---------------+---------+-----------+----------+--------------+  FV Distal Full                                                             +---------+---------------+---------+-----------+----------+--------------+  PFV       Full                                                             +---------+---------------+---------+-----------+----------+--------------+  POP       Full            Yes       Yes                                    +---------+---------------+---------+-----------+----------+--------------+  PTV       Full                                                             +---------+---------------+---------+-----------+----------+--------------+  PERO      Full                                                             +---------+---------------+---------+-----------+----------+--------------+   +---------+---------------+---------+-----------+----------+--------------+  LEFT      Compressibility Phasicity Spontaneity Properties Thrombus Aging   +---------+---------------+---------+-----------+----------+--------------+  CFV       Full            Yes       Yes                                    +---------+---------------+---------+-----------+----------+--------------+  SFJ       Full                                                             +---------+---------------+---------+-----------+----------+--------------+  FV Prox   Full                                                             +---------+---------------+---------+-----------+----------+--------------+  FV Mid    Full                                                             +---------+---------------+---------+-----------+----------+--------------+  FV Distal Full                                                             +---------+---------------+---------+-----------+----------+--------------+  PFV       Full                                                             +---------+---------------+---------+-----------+----------+--------------+  POP       Full            Yes       Yes                                    +---------+---------------+---------+-----------+----------+--------------+  PTV       Full                                                             +---------+---------------+---------+-----------+----------+--------------+  PERO      Full                                                             +---------+---------------+---------+-----------+----------+--------------+     Summary: BILATERAL: - No evidence of deep vein thrombosis seen in the lower extremities, bilaterally. -No evidence of popliteal cyst, bilaterally.   *See table(s) above for measurements and observations. Electronically signed by Deitra Mayo MD on 08/21/2021 at 1:56:40 PM.    Final    VAS [place] UPPER EXTREMITY VENOUS DUPLEX  Result Date: 08/25/2021 UPPER VENOUS STUDY  Patient Name:  Tracy Park  Date of Exam:   08/25/2021 Medical Rec #: [id number]         Accession #:    [id number]  Date of Birth: 1956/01/18         Patient Gender: F Patient Age:   52 years Exam Location:  [facility] Procedure:      VAS [place] UPPER EXTREMITY VENOUS DUPLEX Referring Phys: [place] --------------------------------------------------------------------------------  Indications: Pain Other Indications: Recent IV in left forearm. Comparison Study: No previous exams Performing Technologist: Jody Hill RVT, RDMS  Examination Guidelines: A complete evaluation includes B-mode imaging, spectral Doppler, color Doppler, and power Doppler as needed of all accessible portions of each vessel. Bilateral testing is considered an integral part of a complete examination. Limited examinations for reoccurring indications may be performed as noted.  Right Findings: +----------+------------+---------+-----------+----------+-------+  RIGHT      Compressible Phasicity Spontaneous Properties Summary  +----------+------------+---------+-----------+----------+-------+  Subclavian     Full        Yes        Yes                         +----------+------------+---------+-----------+----------+-------+  Left Findings: +----------+------------+---------+-----------+----------+-------+  LEFT       Compressible Phasicity Spontaneous Properties Summary  +----------+------------+---------+-----------+----------+-------+  IJV            Full        Yes        Yes                         +----------+------------+---------+-----------+----------+-------+  Subclavian     Full        Yes        Yes                         +----------+------------+---------+-----------+----------+-------+  Axillary       Full        Yes        Yes                         +----------+------------+---------+-----------+----------+-------+  Brachial       Full        Yes        Yes                         +----------+------------+---------+-----------+----------+-------+  Radial         Full                                                +----------+------------+---------+-----------+----------+-------+  Ulnar          Full                                               +----------+------------+---------+-----------+----------+-------+  Cephalic       None        No         No                  Acute   +----------+------------+---------+-----------+----------+-------+  Basilic        Full        Yes        Yes                         +----------+------------+---------+-----------+----------+-------+ Acute short segment SVT in forearm portion of cephalic vein.  Summary:  Right: No evidence of thrombosis in the subclavian.  Left: No evidence of deep vein thrombosis in the upper extremity. Findings consistent with acute superficial vein thrombosis involving the left cephalic vein.  *See table(s) above for measurements and observations.    Preliminary     Labs: BNP (last 3 results) No results for input(s): BNP in the last 8760 hours. Basic Metabolic Panel: Recent Labs  Lab 08/20/21 0235 08/21/21 0331  NA 140 138  K 3.7 3.9  CL 106 103  CO2 26 27  GLUCOSE 80 72  BUN 24* 15  CREATININE 0.70 0.58  CALCIUM 8.6* 8.6*   Liver Function Tests: No results for input(s): AST, ALT, ALKPHOS, BILITOT, PROT, ALBUMIN in the last 168 hours. No results for input(s): LIPASE, AMYLASE in the last 168 hours. No results for input(s): AMMONIA in the last 168 hours. CBC: Recent Labs  Lab 08/20/21 0235 08/21/21 0331 08/22/21 0215 08/23/21 0103  WBC 16.7* 14.0* 13.6* 13.6*  NEUTROABS 15.2*  --   --   --   HGB 9.7* 10.1* 10.6* 10.3*  HCT 29.7* 30.9* 32.3* 31.5*  MCV 87.6 86.6 86.8 87.3  PLT 376 395 434* 437*   Cardiac Enzymes: No results for input(s): CKTOTAL, CKMB, CKMBINDEX, TROPONINI in the last 168 hours. BNP: Invalid input(s): POCBNP CBG: No results for input(s): GLUCAP in the last 168 hours. D-Dimer No results for input(s): DDIMER in the last 72 hours. Hgb A1c No results for input(s): HGBA1C in the last 72 hours. Lipid Profile No  results for input(s): CHOL, HDL, LDLCALC, TRIG, CHOLHDL, LDLDIRECT in the last 72 hours. Thyroid function studies No results for input(s): TSH, T4TOTAL, T3FREE, THYROIDAB in the last 72 hours.  Invalid input(s): FREET3 Anemia work up No results for input(s): VITAMINB12, FOLATE, FERRITIN, TIBC, IRON, RETICCTPCT in the last 72 hours. Urinalysis    Component Value Date/Time   COLORURINE YELLOW 08/19/2021 0947   APPEARANCEUR HAZY (A) 08/19/2021 0947   LABSPEC 1.031 (H) 08/19/2021 0947   PHURINE 6.0 08/19/2021 0947   GLUCOSEU NEGATIVE 08/19/2021 0947   HGBUR NEGATIVE 08/19/2021 0947   BILIRUBINUR NEGATIVE 08/19/2021 0947   KETONESUR 5 (A) 08/19/2021 0947   PROTEINUR 30 (A) 08/19/2021 0947   UROBILINOGEN 0.2 11/06/2009 1559   NITRITE NEGATIVE 08/19/2021 0947   LEUKOCYTESUR NEGATIVE 08/19/2021 0947   Sepsis Labs Invalid input(s): PROCALCITONIN,  WBC,  LACTICIDVEN Microbiology Recent Results (from the past 240 hour(s))  Blood culture (routine x 2)     Status: None   Collection Time: 08/16/21  5:37 PM   Specimen: BLOOD  Result Value Ref Range Status   Specimen Description BLOOD SITE NOT SPECIFIED  Final   Special Requests   Final    BOTTLES DRAWN AEROBIC AND ANAEROBIC Blood Culture adequate volume   Culture   Final    NO GROWTH 5 DAYS Performed at [facility], [address], [place], [place] [address]    Report Status 08/21/2021 FINAL  Final  Resp Panel by RT-PCR (Flu A&B, Covid) Nasopharyngeal Swab     Status: Abnormal   Collection Time: 08/16/21  6:31 PM   Specimen: Nasopharyngeal  Swab; Nasopharyngeal(NP) swabs in vial transport medium  Result Value Ref Range Status   SARS Coronavirus 2 by RT PCR NEGATIVE NEGATIVE Final    Comment: (NOTE) SARS-CoV-2 target nucleic acids are NOT DETECTED.  The SARS-CoV-2 RNA is generally detectable in upper respiratory specimens during the acute phase of infection. The lowest concentration of SARS-CoV-2 viral copies this assay can  detect is 138 copies/mL. A negative result does not preclude SARS-Cov-2 infection and should not be used as the sole basis for treatment or other patient management decisions. A negative result may occur with  improper specimen collection/handling, submission of specimen other than nasopharyngeal swab, presence of viral mutation(s) within the areas targeted by this assay, and inadequate number of viral copies(<138 copies/mL). A negative result must be combined with clinical observations, patient history, and epidemiological information. The expected result is Negative.  Fact Sheet for Patients:  EntrepreneurPulse.com.au  Fact Sheet for Healthcare Providers:  IncredibleEmployment.be  This test is no t yet approved or cleared by the [place] FDA and  has been authorized for detection and/or diagnosis of SARS-CoV-2 by FDA under an Emergency Use Authorization (EUA). This EUA will remain  in effect (meaning this test can be used) for the duration of the COVID-19 declaration under Section 564(b)(1) of the Act, 21 U.S.C.section 360bbb-3(b)(1), unless the authorization is terminated  or revoked sooner.       Influenza A by PCR POSITIVE (A) NEGATIVE Final   Influenza B by PCR NEGATIVE NEGATIVE Final    Comment: (NOTE) The Xpert Xpress SARS-CoV-2/FLU/RSV plus assay is intended as an aid in the diagnosis of influenza from Nasopharyngeal swab specimens and should not be used as a sole basis for treatment. Nasal washings and aspirates are unacceptable for Xpert Xpress SARS-CoV-2/FLU/RSV testing.  Fact Sheet for Patients: EntrepreneurPulse.com.au  Fact Sheet for Healthcare Providers: IncredibleEmployment.be  This test is not yet approved or cleared by the [place] FDA and has been authorized for detection and/or diagnosis of SARS-CoV-2 by FDA under an Emergency Use Authorization (EUA). This EUA will  remain in effect (meaning this test can be used) for the duration of the COVID-19 declaration under Section 564(b)(1) of the Act, 21 U.S.C. section 360bbb-3(b)(1), unless the authorization is terminated or revoked.  Performed at [facility], [place] [address], [place], [place] [address]   MRSA Next Gen by PCR, Nasal     Status: None   Collection Time: 08/16/21 10:52 PM   Specimen: Nasal Mucosa; Nasal Swab  Result Value Ref Range Status   MRSA by PCR Next Gen NOT DETECTED NOT DETECTED Final    Comment: (NOTE) The GeneXpert MRSA Assay (FDA approved for NASAL specimens only), is one component of a comprehensive MRSA colonization surveillance program. It is not intended to diagnose MRSA infection nor to guide or monitor treatment for MRSA infections. Test performance is not FDA approved in patients less than 50 years old. Performed at [facility], [place] [address] [place], [place] [address]   Blood culture (routine x 2)     Status: None   Collection Time: 08/17/21  2:16 AM   Specimen: BLOOD RIGHT HAND  Result Value Ref Range Status   Specimen Description BLOOD RIGHT HAND  Final   Special Requests   Final    BOTTLES DRAWN AEROBIC AND ANAEROBIC Blood Culture adequate volume   Culture   Final    NO GROWTH 5 DAYS Performed at [facility], [place] [address], [place], [place] [address]  Report Status 08/22/2021 FINAL  Final  Expectorated Sputum Assessment w Gram Stain, Rflx to Resp Cult     Status: None   Collection Time: 08/21/21 10:56 AM   Specimen: Expectorated Sputum  Result Value Ref Range Status   Specimen Description EXPECTORATED SPUTUM  Final   Special Requests NONE  Final   Sputum evaluation   Final    Sputum specimen not acceptable for testing.  Please recollect.   RESULT CALLED TO, READ BACK BY AND VERIFIED WITH: D LEE RN [id number] 08/22/21 A BROWNING Performed at [facility], [place] [address], [place], [place] [address]    Report Status 08/22/2021 FINAL   Final     Time coordinating discharge: 35 minutes  SIGNED: Antonieta Pert, MD  [facility] 08/26/2021, 9:36 AM  If 7PM-7AM, please contact night-coverage [email or web address]

## 2021-08-25 NOTE — Progress Notes (Signed)
Reviewed discharge instructions with patient. All questions asked and answered. RW and portal oxygen to be delivered to house. Patient and family in agreement with this. Patient transported to lobby by staff in wheelchair.

## 2021-08-25 NOTE — TOC Transition Note (Addendum)
Transition of Care [facility]) - CM/SW Discharge Note   Patient Details  Name: Tracy Park MRN: [id number] Date of Birth: 26-Oct-1955  [facility]) CM/SW Contact:  Bartholomew Crews, RN Phone Number: [phone] 08/25/2021, 5:11 PM   Clinical Narrative:     Patient to transition home today. Spoke with patient on her cell phone. Able to identify that she gets her cpap from [employer] and her oxygen from [employer]. Daughter to provide transport home - daughter will pick up her portable tank from her house. No further TOC  needs identified.   Referral to [facility] for RW. Delivery to patient's home.   Final next level of care: Home/Self Care Barriers to Discharge: No Barriers Identified   Patient Goals and CMS Choice        Discharge Placement                       Discharge Plan and Services                                     Social Determinants of Health (SDOH) Interventions     Readmission Risk Interventions No flowsheet data found.

## 2021-08-25 NOTE — Progress Notes (Signed)
Mobility Specialist Progress Note    08/25/21 1213  Mobility  Activity Ambulated in hall  Level of Assistance Standby assist, set-up cues, supervision of patient - no hands on  Assistive Device Front wheel walker  Distance Ambulated (ft) 150 ft  Mobility Ambulated with assistance in hallway  Mobility Response Tolerated well  Mobility performed by Mobility specialist  Bed Position Chair  $Mobility charge 1 Mobility   Pt received in bed and agreeable. Ambulated on 2LO2. C/o general leg pain. Returned to chair with call bell in reach.   [facility] Mobility Specialist  M.S. Primary Phone: [phone] M.S. Secondary Phone: [phone]

## 2021-08-25 NOTE — Progress Notes (Signed)
LUE venous duplex has been completed.  Results can be found under chart review under CV PROC. 08/25/2021 3:42 PM Anvika Gashi RVT, RDMS

## 2022-02-08 DIAGNOSIS — D649 Anemia, unspecified: Secondary | ICD-10-CM | POA: Diagnosis present

## 2022-02-25 ENCOUNTER — Emergency Department (HOSPITAL_COMMUNITY): Payer: 59

## 2022-02-25 ENCOUNTER — Inpatient Hospital Stay (HOSPITAL_COMMUNITY)
Admission: EM | Admit: 2022-02-25 | Discharge: 2022-03-01 | DRG: 193 | Disposition: A | Discharge status: Home / Self Care | Payer: 59 | Attending: Internal Medicine | Admitting: Internal Medicine

## 2022-02-25 ENCOUNTER — Other Ambulatory Visit: Payer: Self-pay

## 2022-02-25 ENCOUNTER — Encounter (HOSPITAL_COMMUNITY): Payer: Self-pay

## 2022-02-25 DIAGNOSIS — Z85118 Personal history of other malignant neoplasm of bronchus and lung: Secondary | ICD-10-CM

## 2022-02-25 DIAGNOSIS — D649 Anemia, unspecified: Secondary | ICD-10-CM | POA: Diagnosis present

## 2022-02-25 DIAGNOSIS — J189 Pneumonia, unspecified organism: Principal | ICD-10-CM | POA: Diagnosis present

## 2022-02-25 DIAGNOSIS — Z7951 Long term (current) use of inhaled steroids: Secondary | ICD-10-CM

## 2022-02-25 DIAGNOSIS — Z20822 Contact with and (suspected) exposure to covid-19: Secondary | ICD-10-CM | POA: Diagnosis present

## 2022-02-25 DIAGNOSIS — E440 Moderate protein-calorie malnutrition: Secondary | ICD-10-CM | POA: Diagnosis present

## 2022-02-25 DIAGNOSIS — E43 Unspecified severe protein-calorie malnutrition: Secondary | ICD-10-CM | POA: Diagnosis present

## 2022-02-25 DIAGNOSIS — R64 Cachexia: Secondary | ICD-10-CM | POA: Diagnosis present

## 2022-02-25 DIAGNOSIS — Z9221 Personal history of antineoplastic chemotherapy: Secondary | ICD-10-CM

## 2022-02-25 DIAGNOSIS — E039 Hypothyroidism, unspecified: Secondary | ICD-10-CM | POA: Diagnosis present

## 2022-02-25 DIAGNOSIS — Z66 Do not resuscitate: Secondary | ICD-10-CM | POA: Diagnosis present

## 2022-02-25 DIAGNOSIS — F4310 Post-traumatic stress disorder, unspecified: Secondary | ICD-10-CM | POA: Diagnosis present

## 2022-02-25 DIAGNOSIS — J4489 Other specified chronic obstructive pulmonary disease: Secondary | ICD-10-CM

## 2022-02-25 DIAGNOSIS — K5900 Constipation, unspecified: Secondary | ICD-10-CM | POA: Diagnosis present

## 2022-02-25 DIAGNOSIS — Z79890 Hormone replacement therapy (postmenopausal): Secondary | ICD-10-CM

## 2022-02-25 DIAGNOSIS — Z9981 Dependence on supplemental oxygen: Secondary | ICD-10-CM

## 2022-02-25 DIAGNOSIS — C3492 Malignant neoplasm of unspecified part of left bronchus or lung: Secondary | ICD-10-CM | POA: Diagnosis not present

## 2022-02-25 DIAGNOSIS — K219 Gastro-esophageal reflux disease without esophagitis: Secondary | ICD-10-CM | POA: Diagnosis not present

## 2022-02-25 DIAGNOSIS — Z902 Acquired absence of lung [part of]: Secondary | ICD-10-CM

## 2022-02-25 DIAGNOSIS — J181 Lobar pneumonia, unspecified organism: Secondary | ICD-10-CM | POA: Diagnosis not present

## 2022-02-25 DIAGNOSIS — F319 Bipolar disorder, unspecified: Secondary | ICD-10-CM | POA: Diagnosis present

## 2022-02-25 DIAGNOSIS — J9621 Acute and chronic respiratory failure with hypoxia: Secondary | ICD-10-CM | POA: Diagnosis not present

## 2022-02-25 DIAGNOSIS — J441 Chronic obstructive pulmonary disease with (acute) exacerbation: Secondary | ICD-10-CM

## 2022-02-25 DIAGNOSIS — Z79899 Other long term (current) drug therapy: Secondary | ICD-10-CM

## 2022-02-25 DIAGNOSIS — E876 Hypokalemia: Secondary | ICD-10-CM | POA: Diagnosis present

## 2022-02-25 DIAGNOSIS — E785 Hyperlipidemia, unspecified: Secondary | ICD-10-CM | POA: Diagnosis present

## 2022-02-25 DIAGNOSIS — C3490 Malignant neoplasm of unspecified part of unspecified bronchus or lung: Secondary | ICD-10-CM

## 2022-02-25 DIAGNOSIS — Z681 Body mass index (BMI) 19 or less, adult: Secondary | ICD-10-CM

## 2022-02-25 DIAGNOSIS — Z720 Tobacco use: Secondary | ICD-10-CM | POA: Diagnosis present

## 2022-02-25 DIAGNOSIS — I252 Old myocardial infarction: Secondary | ICD-10-CM

## 2022-02-25 DIAGNOSIS — Z90710 Acquired absence of both cervix and uterus: Secondary | ICD-10-CM

## 2022-02-25 DIAGNOSIS — D72829 Elevated white blood cell count, unspecified: Secondary | ICD-10-CM

## 2022-02-25 DIAGNOSIS — J9611 Chronic respiratory failure with hypoxia: Secondary | ICD-10-CM

## 2022-02-25 DIAGNOSIS — F17210 Nicotine dependence, cigarettes, uncomplicated: Secondary | ICD-10-CM | POA: Diagnosis present

## 2022-02-25 LAB — CBC
HCT: 36.6 % (ref 36.0–46.0)
Hemoglobin: 11.7 g/dL — ABNORMAL LOW (ref 12.0–15.0)
MCH: 27.6 pg (ref 26.0–34.0)
MCHC: 32 g/dL (ref 30.0–36.0)
MCV: 86.3 fL (ref 80.0–100.0)
Platelets: 392 10*3/uL (ref 150–400)
RBC: 4.24 MIL/uL (ref 3.87–5.11)
RDW: 18.6 % — ABNORMAL HIGH (ref 11.5–15.5)
WBC: 25.9 10*3/uL — ABNORMAL HIGH (ref 4.0–10.5)
nRBC: 0 % (ref 0.0–0.2)

## 2022-02-25 LAB — BASIC METABOLIC PANEL
Anion gap: 10 (ref 5–15)
BUN: 15 mg/dL (ref 8–23)
CO2: 27 mmol/L (ref 22–32)
Calcium: 8.8 mg/dL — ABNORMAL LOW (ref 8.9–10.3)
Chloride: 98 mmol/L (ref 98–111)
Creatinine, Ser: 0.58 mg/dL (ref 0.44–1.00)
GFR, Estimated: >60 mL/min (ref >60)
Glucose, Bld: 127 mg/dL — ABNORMAL HIGH (ref 70–99)
Potassium: 3.7 mmol/L (ref 3.5–5.1)
Sodium: 135 mmol/L (ref 135–145)

## 2022-02-25 LAB — RESP PANEL BY RT-PCR (FLU A&B, COVID) ARPGX2
Influenza A by PCR: NEGATIVE
Influenza B by PCR: NEGATIVE
SARS Coronavirus 2 by RT PCR: NEGATIVE

## 2022-02-25 LAB — TROPONIN I (HIGH SENSITIVITY)
Troponin I (High Sensitivity): 7 ng/L (ref <18)
Troponin I (High Sensitivity): 7 ng/L (ref <18)

## 2022-02-25 MED ORDER — PIPERACILLIN-TAZOBACTAM 3.375 G IVPB 30 MIN
3.3750 g | Freq: Once | INTRAVENOUS | Status: AC
  Administered 2022-02-26: 3.375 g via INTRAVENOUS
  Filled 2022-02-25: qty 50

## 2022-02-25 MED ORDER — VANCOMYCIN HCL IN DEXTROSE 1-5 GM/200ML-% IV SOLN
1000.0000 mg | Freq: Once | INTRAVENOUS | Status: AC
  Administered 2022-02-26: 1000 mg via INTRAVENOUS
  Filled 2022-02-25: qty 200

## 2022-02-25 MED ORDER — IOHEXOL 350 MG/ML SOLN
80.0000 mL | Freq: Once | INTRAVENOUS | Status: AC | PRN
  Administered 2022-02-25: 80 mL via INTRAVENOUS

## 2022-02-25 NOTE — Progress Notes (Signed)
A consult was received from an ED physician for Vancomycin and Zosyn per pharmacy dosing.  The patient's profile has been reviewed for ht/wt/allergies/indication/available labs.    A one time order has been placed for Vancomycin 1gm IV and Zosyn 3.375gm IV.    Further antibiotics/pharmacy consults should be ordered by admitting physician if indicated.                       Thank you, Everette Rank, PharmD 02/25/2022  11:14 PM

## 2022-02-25 NOTE — ED Provider Notes (Signed)
[facility] DEPT Provider Note   CSN: [id number] Arrival date & time: 02/25/22  1338     History  Chief Complaint  Patient presents with   Emesis   Chills   Generalized Body Aches   Shortness of Breath   Weakness    Tracy Park is a 66 y.o. female.  HPI  66 year old female with past medical history of COPD on chronic oxygen, lung cancer status postchemotherapy/resection presents to the emergency department with shortness of breath, chest discomfort, intermittently productive cough.  Of note patient was admitted a couple weeks ago for presumed pneumonia.  Treated with IV antibiotics, transition to oral.  She completed this regimen, briefly improved and now feels worse.  Of note she was discharged on prednisone which she states she did not take because "she is allergic to it".  Patient denies any abdominal pain, back pain, nausea/vomiting/diarrhea or swelling of her lower extremities.  She is otherwise been compliant with her medications.  Home Medications Prior to Admission medications   Medication Sig Start Date End Date Taking? Authorizing Provider  albuterol (PROVENTIL) (2.5 MG/3ML) 0.083% nebulizer solution Take 2.5 mg by nebulization every 6 (six) hours as needed for wheezing or shortness of breath.    [provider]  albuterol (VENTOLIN HFA) 108 (90 Base) MCG/ACT inhaler Inhale 2 puffs into the lungs every 6 (six) hours as needed for wheezing or shortness of breath.  03/23/20   [provider]  budesonide-formoterol (SYMBICORT) 160-4.5 MCG/ACT inhaler Inhale 1 puff into the lungs in the morning and at bedtime. 07/11/21   [provider]  busPIRone (BUSPAR) 5 MG tablet Take 5 mg by mouth 3 (three) times daily.    [provider]  Cholecalciferol 25 MCG (1000 UT) tablet Take 1,000 Units by mouth daily.    [provider]  desvenlafaxine (PRISTIQ) 50 MG 24 hr tablet Take 50 mg by mouth daily.     [provider]  famotidine (PEPCID) 40 MG tablet Take 40 mg by mouth daily. 02/14/20   [provider]  levothyroxine (SYNTHROID) 137 MCG tablet Take 137 mcg by mouth daily before breakfast. 03/14/20   [provider]  meloxicam (MOBIC) 15 MG tablet Take 15 mg by mouth daily. 04/07/20   [provider]  methocarbamol (ROBAXIN) 500 MG tablet Take 500 mg by mouth daily. 02/10/20   [provider]  Multiple Vitamins-Minerals (CENTRUM WOMEN) TABS Take 1 tablet by mouth daily.    [provider]  nitroGLYCERIN (NITROSTAT) 0.4 MG SL tablet Place 0.4 mg under the tongue every 5 (five) minutes as needed for chest pain.    [provider]  omeprazole (PRILOSEC) 20 MG capsule Take 40 mg by mouth daily.    [provider]  ondansetron (ZOFRAN) 4 MG tablet Take 4 mg by mouth daily. 03/10/20   [provider]  oxybutynin (DITROPAN) 5 MG tablet Take 5 mg by mouth 3 (three) times daily. 03/15/20   [provider]  OXYGEN Inhale 2 L into the lungs continuous.    [provider]  predniSONE (DELTASONE) 10 MG tablet 3 tabs daily x 3 days,2 tabs daily x 3 days,1 tab daily x 3 days then stop. 08/25/21   Antonieta Pert, MD  QUEtiapine (SEROQUEL) 300 MG tablet Take 300 mg by mouth at bedtime.    [provider]  rosuvastatin (CRESTOR) 20 MG tablet Take 20 mg by mouth daily. 01/15/20   [provider]  senna (SENOKOT) 8.6  MG TABS tablet Take 1 tablet by mouth 2 (two) times daily as needed for mild constipation.    [provider]  sertraline (ZOLOFT) 100 MG tablet Take 100 mg by mouth in the morning and at bedtime.    [provider]  tamsulosin (FLOMAX) 0.4 MG CAPS capsule Take 0.4 mg by mouth daily. 03/21/20   [provider]      Allergies    Red dye, Oxycontin [oxycodone hcl], Aspirin, and Tape    Review of Systems   Review of Systems  Constitutional:  Positive for fatigue. Negative for  fever.  Respiratory:  Positive for cough and shortness of breath.   Cardiovascular:  Positive for chest pain. Negative for palpitations and leg swelling.  Gastrointestinal:  Negative for abdominal pain, diarrhea and vomiting.  Genitourinary:  Negative for flank pain.  Musculoskeletal:  Negative for back pain.  Skin:  Negative for rash.  Neurological:  Negative for headaches.    Physical Exam Updated Vital Signs BP 103/73   Pulse 88   Temp 98.5 F (36.9 C) (Oral)   Resp 20   Ht 5\' 3"  (1.6 m)   Wt 44 kg   SpO2 95%   BMI 17.18 kg/m  Physical Exam Vitals and nursing note reviewed.  Constitutional:      General: She is not in acute distress.    Appearance: Normal appearance.  HENT:     Head: Normocephalic.     Mouth/Throat:     Mouth: Mucous membranes are moist.  Cardiovascular:     Rate and Rhythm: Normal rate.  Pulmonary:     Effort: Pulmonary effort is normal. No tachypnea or respiratory distress.     Breath sounds: Rales present.  Abdominal:     Palpations: Abdomen is soft.     Tenderness: There is no abdominal tenderness.  Skin:    General: Skin is warm.  Neurological:     Mental Status: She is alert and oriented to person, place, and time. Mental status is at baseline.  Psychiatric:        Mood and Affect: Mood normal.     ED Results / Procedures / Treatments   Labs (all labs ordered are listed, but only abnormal results are displayed) Labs Reviewed  BASIC METABOLIC PANEL - Abnormal; Notable for the following components:      Result Value   Glucose, Bld 127 (*)    Calcium 8.8 (*)    All other components within normal limits  CBC - Abnormal; Notable for the following components:   WBC 25.9 (*)    Hemoglobin 11.7 (*)    RDW 18.6 (*)    All other components within normal limits  RESP PANEL BY RT-PCR (FLU A&B, COVID) [id number]  TROPONIN I (HIGH SENSITIVITY)  TROPONIN I (HIGH SENSITIVITY)    EKG None  Radiology DG Chest 2 View  Result Date:  02/25/2022 CLINICAL DATA:  Shortness of breath for several days, central chest pain especially with deep breathing; history of COPD, lung cancer, MI, smoker, pneumonia EXAM: CHEST - 2 VIEW COMPARISON:  08/20/2021 FINDINGS: Normal heart size, mediastinal contours, and pulmonary vascularity. Volume loss and scarring in the LEFT upper lobe. New LEFT upper lobe infiltrate consistent with pneumonia. Underlying emphysematous changes consistent with COPD. Resolution of previously identified RIGHT upper lobe and LEFT basilar infiltrates. No pleural effusion or pneumothorax. Osseous demineralization. Atherosclerotic calcifications aorta. IMPRESSION: COPD changes with postsurgical changes/scarring in LEFT upper lobe. New coexistent LEFT upper lobe infiltrate question pneumonia; follow-up  imaging until resolution recommended to exclude underlying abnormalities including tumor. Aortic Atherosclerosis (ICD10-I70.0) and Emphysema (ICD10-J43.9). Electronically Signed   By: Lavonia Dana M.D.   On: 02/25/2022 16:22    Procedures Procedures    Medications Ordered in ED Medications  iohexol (OMNIPAQUE) 350 MG/ML injection 80 mL (80 mLs Intravenous Contrast Given 02/25/22 2231)    ED Course/ Medical Decision Making/ A&P                           Medical Decision Making Amount and/or Complexity of Data Reviewed Labs: ordered. Radiology: ordered.  Risk Prescription drug management. Decision regarding hospitalization.   66 year old female presents emergency department with chest pain, shortness of breath, productive cough, body aches.  Patient has been having to increase her supplemental oxygen at home.  Had a recent admission a couple weeks ago for pneumonia that was treated with IV antibiotics, discharged on oral.  Patient states she is slightly improved however now feels worse.  Patient was tachycardic on arrival, afebrile, on her baseline nasal cannula, appears fatigued and ill-appearing but no acute  respiratory distress.  Labs show leukocytosis of 25, patient has not been taking the prednisone, believe that this is genuine in regards to infection.  Troponin is negative, flu and COVID swab was negative.  Chest x-ray shows a left upper lobe pneumonia.  However given the hemoptysis, history of cancer CT imaging was pursued for further evaluation.  This ruled out pulmonary embolism/pulmonary infarct.  We will plan to treat as healthcare associated pneumonia that has failed therapy.  We will give IV antibiotics and admit.  No signs of sepsis.  Patients evaluation and results requires admission for further treatment and care.  Spoke with hospitalist Dr. Flossie Buffy, reviewed patient's ED course and they accept admission.  Patient agrees with admission plan, offers no new complaints and is stable/unchanged at time of admit.        Final Clinical Impression(s) / ED Diagnoses Final diagnoses:  None    Rx / DC Orders ED Discharge Orders     None         Lorelle Gibbs, DO 02/25/22 2326

## 2022-02-25 NOTE — ED Notes (Signed)
Save blue tube in main lab °

## 2022-02-25 NOTE — ED Provider Triage Note (Addendum)
Emergency Medicine Provider Triage Evaluation Note  Tracy Park , a 66 y.o. female  was evaluated in triage.  Pt complains of shortness of breath for several days. Centralized CP, especially with deep breathing. Chronically on 2L oxygen. Have been using 2 breathing treatments daily without relief. Hx stage 3 kung cancer, been in remission for 4-5 years. Was admitted to [facility] for pneumonia, d/c on 6/4.  Review of Systems  Positive: CP, SOB, nausea and dry heaving, abd pain, headache Negative: syncope  Physical Exam  BP (!) 128/95 (BP Location: Left Arm)   Pulse (!) 114   Temp 98.5 F (36.9 C) (Oral)   Resp (!) 26   Ht 5\' 3"  (1.6 m)   Wt 44 kg   SpO2 95%   BMI 17.18 kg/m  Gen:   Awake, no distress   Resp:  Normal effort  MSK:   Moves extremities without difficulty  Other:    Medical Decision Making  Medically screening exam initiated at 2:19 PM.  Appropriate orders placed.  Najai L Schiele was informed that the remainder of the evaluation will be completed by another provider, this initial triage assessment does not replace that evaluation, and the importance of remaining in the ED until their evaluation is complete.  Patient normally on 2L, turned up to 4L to see if she has any symptom improvement while obtaining workup   Brandin Dilday T, PA-C 02/25/22 1425

## 2022-02-25 NOTE — ED Triage Notes (Addendum)
Per EMS- Patient c/o N/v, chills, body aches x 2 days.  Patient is on home O2 2L/min via [place] Sats 97%  Patient added in triage that she is more SOB than usual and is having chest pain

## 2022-02-26 DIAGNOSIS — D72829 Elevated white blood cell count, unspecified: Secondary | ICD-10-CM

## 2022-02-26 DIAGNOSIS — C3490 Malignant neoplasm of unspecified part of unspecified bronchus or lung: Secondary | ICD-10-CM

## 2022-02-26 DIAGNOSIS — J9621 Acute and chronic respiratory failure with hypoxia: Secondary | ICD-10-CM

## 2022-02-26 DIAGNOSIS — J4489 Other specified chronic obstructive pulmonary disease: Secondary | ICD-10-CM

## 2022-02-26 DIAGNOSIS — J9611 Chronic respiratory failure with hypoxia: Secondary | ICD-10-CM

## 2022-02-26 DIAGNOSIS — E43 Unspecified severe protein-calorie malnutrition: Secondary | ICD-10-CM | POA: Diagnosis not present

## 2022-02-26 DIAGNOSIS — J189 Pneumonia, unspecified organism: Secondary | ICD-10-CM | POA: Diagnosis not present

## 2022-02-26 DIAGNOSIS — E039 Hypothyroidism, unspecified: Secondary | ICD-10-CM | POA: Diagnosis not present

## 2022-02-26 DIAGNOSIS — C3492 Malignant neoplasm of unspecified part of left bronchus or lung: Secondary | ICD-10-CM | POA: Diagnosis not present

## 2022-02-26 DIAGNOSIS — J441 Chronic obstructive pulmonary disease with (acute) exacerbation: Secondary | ICD-10-CM

## 2022-02-26 HISTORY — DX: Malignant neoplasm of unspecified part of unspecified bronchus or lung: C34.90

## 2022-02-26 LAB — CBC
HCT: 32.8 % — ABNORMAL LOW (ref 36.0–46.0)
Hemoglobin: 10.4 g/dL — ABNORMAL LOW (ref 12.0–15.0)
MCH: 27.7 pg (ref 26.0–34.0)
MCHC: 31.7 g/dL (ref 30.0–36.0)
MCV: 87.2 fL (ref 80.0–100.0)
Platelets: 362 10*3/uL (ref 150–400)
RBC: 3.76 MIL/uL — ABNORMAL LOW (ref 3.87–5.11)
RDW: 18.3 % — ABNORMAL HIGH (ref 11.5–15.5)
WBC: 14.9 10*3/uL — ABNORMAL HIGH (ref 4.0–10.5)
nRBC: 0 % (ref 0.0–0.2)

## 2022-02-26 LAB — EXPECTORATED SPUTUM ASSESSMENT W GRAM STAIN, RFLX TO RESP C

## 2022-02-26 LAB — STREP PNEUMONIAE URINARY ANTIGEN: Strep Pneumo Urinary Antigen: NEGATIVE

## 2022-02-26 MED ORDER — UMECLIDINIUM BROMIDE 62.5 MCG/ACT IN AEPB
1.0000 | INHALATION_SPRAY | Freq: Every day | RESPIRATORY_TRACT | Status: DC
  Administered 2022-02-26 – 2022-03-01 (×4): 1 via RESPIRATORY_TRACT
  Filled 2022-02-26: qty 7

## 2022-02-26 MED ORDER — IPRATROPIUM-ALBUTEROL 0.5-2.5 (3) MG/3ML IN SOLN: 3.0000 mL | RESPIRATORY_TRACT | Status: DC | PRN

## 2022-02-26 MED ORDER — FLUTICASONE FUROATE-VILANTEROL 100-25 MCG/ACT IN AEPB
1.0000 | INHALATION_SPRAY | Freq: Every day | RESPIRATORY_TRACT | Status: DC
  Administered 2022-02-26 – 2022-03-01 (×4): 1 via RESPIRATORY_TRACT
  Filled 2022-02-26: qty 28

## 2022-02-26 MED ORDER — METHYLPREDNISOLONE SODIUM SUCC 125 MG IJ SOLR
80.0000 mg | INTRAMUSCULAR | Status: DC
  Administered 2022-02-26 – 2022-02-28 (×3): 80 mg via INTRAVENOUS
  Filled 2022-02-26 (×3): qty 2

## 2022-02-26 MED ORDER — SENNA 8.6 MG PO TABS
1.0000 | ORAL_TABLET | Freq: Two times a day (BID) | ORAL | Status: DC | PRN
Start: 2022-02-26 — End: 2022-03-01

## 2022-02-26 MED ORDER — MELOXICAM 15 MG PO TABS
15.0000 mg | ORAL_TABLET | Freq: Every day | ORAL | Status: DC
  Administered 2022-02-26 – 2022-03-01 (×4): 15 mg via ORAL
  Filled 2022-02-26 (×4): qty 1

## 2022-02-26 MED ORDER — ENOXAPARIN SODIUM 30 MG/0.3ML IJ SOSY
30.0000 mg | PREFILLED_SYRINGE | INTRAMUSCULAR | Status: DC
  Administered 2022-02-26 – 2022-03-01 (×4): 30 mg via SUBCUTANEOUS
  Filled 2022-02-26 (×4): qty 0.3

## 2022-02-26 MED ORDER — ROSUVASTATIN CALCIUM 10 MG PO TABS
20.0000 mg | ORAL_TABLET | Freq: Every day | ORAL | Status: DC
  Administered 2022-02-26 – 2022-03-01 (×4): 20 mg via ORAL
  Filled 2022-02-26: qty 2
  Filled 2022-02-26: qty 1
  Filled 2022-02-26 (×2): qty 2

## 2022-02-26 MED ORDER — ACETAMINOPHEN 325 MG PO TABS
650.0000 mg | ORAL_TABLET | Freq: Four times a day (QID) | ORAL | Status: DC | PRN
  Administered 2022-02-26: 650 mg via ORAL
  Filled 2022-02-26: qty 2

## 2022-02-26 MED ORDER — OXYBUTYNIN CHLORIDE 5 MG PO TABS
5.0000 mg | ORAL_TABLET | Freq: Three times a day (TID) | ORAL | Status: DC
  Administered 2022-02-26 – 2022-03-01 (×10): 5 mg via ORAL
  Filled 2022-02-26 (×10): qty 1

## 2022-02-26 MED ORDER — VITAMIN D 25 MCG (1000 UNIT) PO TABS
1000.0000 [IU] | ORAL_TABLET | Freq: Every day | ORAL | Status: DC
  Administered 2022-02-26 – 2022-03-01 (×4): 1000 [IU] via ORAL
  Filled 2022-02-26 (×4): qty 1

## 2022-02-26 MED ORDER — TAMSULOSIN HCL 0.4 MG PO CAPS
0.4000 mg | ORAL_CAPSULE | Freq: Every day | ORAL | Status: DC
  Administered 2022-02-26 – 2022-03-01 (×4): 0.4 mg via ORAL
  Filled 2022-02-26 (×4): qty 1

## 2022-02-26 MED ORDER — ENSURE ENLIVE PO LIQD
237.0000 mL | Freq: Two times a day (BID) | ORAL | Status: DC
  Administered 2022-02-26 – 2022-03-01 (×5): 237 mL via ORAL
  Filled 2022-02-26 (×4): qty 237

## 2022-02-26 MED ORDER — IPRATROPIUM-ALBUTEROL 0.5-2.5 (3) MG/3ML IN SOLN: 3.0000 mL | Freq: Four times a day (QID) | RESPIRATORY_TRACT | Status: DC | PRN

## 2022-02-26 MED ORDER — LEVOTHYROXINE SODIUM 25 MCG PO TABS
137.0000 ug | ORAL_TABLET | Freq: Every day | ORAL | Status: DC
  Administered 2022-02-26 – 2022-03-01 (×4): 137 ug via ORAL
  Filled 2022-02-26 (×4): qty 1

## 2022-02-26 MED ORDER — SODIUM CHLORIDE 0.9 % IV SOLN
2.0000 g | Freq: Two times a day (BID) | INTRAVENOUS | Status: DC
  Administered 2022-02-26 – 2022-03-01 (×7): 2 g via INTRAVENOUS
  Filled 2022-02-26 (×7): qty 12.5

## 2022-02-26 MED ORDER — BUSPIRONE HCL 5 MG PO TABS
5.0000 mg | ORAL_TABLET | Freq: Three times a day (TID) | ORAL | Status: DC
  Administered 2022-02-26 – 2022-03-01 (×10): 5 mg via ORAL
  Filled 2022-02-26 (×10): qty 1

## 2022-02-26 MED ORDER — QUETIAPINE FUMARATE 100 MG PO TABS
300.0000 mg | ORAL_TABLET | Freq: Every day | ORAL | Status: DC
  Administered 2022-02-26 – 2022-02-28 (×3): 300 mg via ORAL
  Filled 2022-02-26 (×3): qty 3

## 2022-02-26 MED ORDER — PANTOPRAZOLE SODIUM 40 MG PO TBEC
40.0000 mg | DELAYED_RELEASE_TABLET | Freq: Every day | ORAL | Status: DC
  Administered 2022-02-26 – 2022-03-01 (×4): 40 mg via ORAL
  Filled 2022-02-26 (×4): qty 1

## 2022-02-26 MED ORDER — SODIUM CHLORIDE 0.9 % IV SOLN
500.0000 mg | INTRAVENOUS | Status: DC
  Administered 2022-02-26 – 2022-03-01 (×4): 500 mg via INTRAVENOUS
  Filled 2022-02-26 (×4): qty 5

## 2022-02-26 MED ORDER — METHOCARBAMOL 500 MG PO TABS
500.0000 mg | ORAL_TABLET | Freq: Every day | ORAL | Status: DC
  Administered 2022-02-26 – 2022-03-01 (×4): 500 mg via ORAL
  Filled 2022-02-26 (×4): qty 1

## 2022-02-26 MED ORDER — FAMOTIDINE 20 MG PO TABS
40.0000 mg | ORAL_TABLET | Freq: Every day | ORAL | Status: DC
  Administered 2022-02-26 – 2022-03-01 (×4): 40 mg via ORAL
  Filled 2022-02-26 (×4): qty 2

## 2022-02-26 NOTE — ED Notes (Signed)
Hospitalist at bedside 

## 2022-02-26 NOTE — ED Notes (Signed)
Rounded on pt. Pt resting comfortably and has no needs. Only needed coffee.

## 2022-02-26 NOTE — Assessment & Plan Note (Signed)
Started on IV vancomycin and Zosyn in the ED.  Has no history of positive MRSA swab.  Will de-escalate to cefepime and azithromycin. -Obtain urine Legionella, strep pneumo and sputum culture -Recommend repeat imaging following resolution of symptoms

## 2022-02-26 NOTE — Assessment & Plan Note (Signed)
Continue home levothyroxine 

## 2022-02-26 NOTE — ED Notes (Signed)
Patient requesting medication for headache. Message sent to MD through secured chat.

## 2022-02-26 NOTE — Assessment & Plan Note (Signed)
>>  ASSESSMENT AND PLAN FOR PROTEIN-CALORIE MALNUTRITION, SEVERE WRITTEN ON 02/26/2022  2:08 AM BY TU, CHING T, DO  -Ensure supplement

## 2022-02-26 NOTE — H&P (Signed)
History and Physical    Patient: Tracy Park [id number]:[id number] DOB: [birth year] DOA: 02/25/2022 DOS: the patient was seen and examined on 02/26/2022 PCP: Irving Shows, PA-C  Patient coming from: Home  Chief Complaint:  Chief Complaint  Patient presents with   Emesis   Chills   Generalized Body Aches   Shortness of Breath   Weakness   HPI: Tracy Park is a 66 y.o. female with medical history significant of left upper lobe adenocarcinoma s/p left upper lobectomy in 2019 and chemotherapy, COPD with chronic hypoxic respiratory failure on 2 L, OSA, hypothyroidism, hyperlipidemia and GERD who presents with worsening shortness of breath.  Patient recently admitted from 02/08/22 -02/10/22 at [facility] after presenting with hypoxia in the 70s with productive cough and shortness of breath.  She was septic with x-ray finding of left upper lobe infiltrate.  Initially treated with IV vancomycin, azithromycin and Zosyn and eventually discharged home on Omnicef, doxycycline, and prednisone. Completed her antibiotic course but unable to tolerate prednisone due to GI upset. Continued to feel fatigue and dyspneic with any exertion. Has productive cough. Continue to smoke 2-3 cigarettes daily.   CTA chest today is negative for pulmonary embolism.  Coarse consolidation seen in left upper chest likely pneumonia with mild patchy infiltrate in the right upper lung.    She was afebrile, tachycardic and mildly tachypneic on her home 2 L via nasal cannula.  Has leukocytosis of 25 K.  BMP is largely unremarkable. Review of Systems: As mentioned in the history of present illness. All other systems reviewed and are negative. Past Medical History:  Diagnosis Date   Anginal pain ([place])    Anxiety    Bipolar disorder ([place])    Cancer ([place])    COPD (chronic obstructive pulmonary disease) ([place])    Dyspnea    Family history of adverse reaction to anesthesia    History of kidney stones     Hydroureteronephrosis 08/16/2021   Hypothyroidism    Lung cancer ([place])    Myocardial infarction ([place])    PTSD (post-traumatic stress disorder)    Sleep apnea    Thyroid disease    Past Surgical History:  Procedure Laterality Date   ABDOMINAL HYSTERECTOMY     BACK SURGERY     CYSTOSCOPY W/ URETERAL STENT PLACEMENT Right 05/03/2021   Procedure: CYSTOSCOPY WITH RETROGRADE PYELOGRAM/URETERAL STENT PLACEMENT;  Surgeon: Ardis Hughs, MD;  Location: [facility];  Service: Urology;  Laterality: Right;   EYE SURGERY     kidney stent     thyroidectomy     Social History:  reports that she has been smoking cigarettes. She has been smoking an average of .15 packs per day. She has never used smokeless tobacco. She reports that she does not currently use alcohol. She reports that she does not currently use drugs.  Allergies  Allergen Reactions   Red Dye Hives and Itching   Oxycontin [Oxycodone Hcl]     Other reaction(s): Hallucinations   Aspirin Hives   Tape Rash    ONLY USE PAPER TAPE    Family History  Family history unknown: Yes    Prior to Admission medications   Medication Sig Start Date End Date Taking? Authorizing Provider  albuterol (PROVENTIL) (2.5 MG/3ML) 0.083% nebulizer solution Take 2.5 mg by nebulization every 6 (six) hours as needed for wheezing or shortness of breath.   Yes [provider]  busPIRone (BUSPAR) 5 MG tablet Take 5 mg by mouth 3 (three) times daily.  Yes [provider]  Cholecalciferol 25 MCG (1000 UT) tablet Take 1,000 Units by mouth daily.   Yes [provider]  famotidine (PEPCID) 40 MG tablet Take 40 mg by mouth daily. 02/14/20  Yes [provider]  ipratropium-albuterol (DUONEB) 0.5-2.5 (3) MG/3ML SOLN Take 3 mLs by nebulization every 6 (six) hours as needed. 02/10/22  Yes [provider]  levothyroxine (SYNTHROID) 137 MCG tablet Take 137 mcg by mouth daily before breakfast. 03/14/20  Yes [provider]   meloxicam (MOBIC) 15 MG tablet Take 15 mg by mouth daily. 04/07/20  Yes [provider]  methocarbamol (ROBAXIN) 500 MG tablet Take 500 mg by mouth daily. 02/10/20  Yes [provider]  Multiple Vitamins-Minerals (CENTRUM WOMEN) TABS Take 1 tablet by mouth daily.   Yes [provider]  nitroGLYCERIN (NITROSTAT) 0.4 MG SL tablet Place 0.4 mg under the tongue every 5 (five) minutes as needed for chest pain.   Yes [provider]  omeprazole (PRILOSEC) 20 MG capsule Take 40 mg by mouth daily.   Yes [provider]  oxybutynin (DITROPAN) 5 MG tablet Take 5 mg by mouth 3 (three) times daily. 03/15/20  Yes [provider]  QUEtiapine (SEROQUEL) 300 MG tablet Take 300 mg by mouth at bedtime.   Yes [provider]  rosuvastatin (CRESTOR) 20 MG tablet Take 20 mg by mouth daily. 01/15/20  Yes [provider]  senna (SENOKOT) 8.6 MG TABS tablet Take 1 tablet by mouth 2 (two) times daily as needed for mild constipation.   Yes [provider]  tamsulosin (FLOMAX) 0.4 MG CAPS capsule Take 0.4 mg by mouth daily. 03/21/20  Yes [provider]  TRELEGY ELLIPTA 100-62.5-25 MCG/ACT AEPB Take 1 puff by mouth daily. 02/06/22  Yes [provider]  OXYGEN Inhale 2 L into the lungs continuous.    [provider]  predniSONE (DELTASONE) 10 MG tablet 3 tabs daily x 3 days,2 tabs daily x 3 days,1 tab daily x 3 days then stop. Patient not taking: Reported on 02/25/2022 08/25/21   Antonieta Pert, MD    Physical Exam: Vitals:   02/25/22 2030 02/25/22 2100 02/25/22 2145 02/25/22 2200  BP: 109/69 112/72  103/73  Pulse: 93 93 89 88  Resp: 19 (!) 26 17 20   Temp:      TempSrc:      SpO2: 100% 100% 95% 95%  Weight:      Height:       Constitutional: NAD, calm, comfortable, chronically ill-appearing cachectic elderly female laying in approximately 30 degree incline in bed with diaphoresis on her back with soaked pillowcase and  sheets. Eyes:  lids and conjunctivae normal ENMT: Mucous membranes are moist.  Neck: normal, supple Respiratory: clear to auscultation bilaterally, no wheezing, no crackles. Normal respiratory effort. No accessory muscle use.  On 2 L via nasal cannula Cardiovascular: Regular rate and rhythm, no murmurs / rubs / gallops. No extremity edema. 2+ pedal pulses.  Abdomen: no tenderness, no masses palpated. Bowel sounds positive.  Musculoskeletal: no clubbing / cyanosis. No joint deformity upper and lower extremities.  Muscle wasting on all extremities.   Skin: no rashes, lesions, ulcers.  Neurologic: CN 2-12 grossly intact.  Strength 5/5 in all 4.  Psychiatric: Normal judgment and insight. Alert and oriented x 3. Normal mood. Data Reviewed:  See HPI   Assessment and Plan: * Acute on chronic respiratory failure with hypoxia ([place]) Secondary to community-acquired pneumonia.  Remains on home 2 L for  COPD but having worsening tachypnea and dyspnea. -Does not appear to be in acute COPD exacerbation but she has ongoing tobacco use.  Encourage cessation. -Treatment as below for pneumonia  Adenocarcinoma of lung ([place])  s/p left upper lobectomy in 2019 and chemotherapy in remission. Follows with oncology at [facility]. -recommend repeat imaging following resolution of pneumonia  Protein-calorie malnutrition, severe -Ensure supplement  CAP (community acquired pneumonia) Started on IV vancomycin and Zosyn in the ED.  Has no history of positive MRSA swab.  Will de-escalate to cefepime and azithromycin. -Obtain urine Legionella, strep pneumo and sputum culture -Recommend repeat imaging following resolution of symptoms  Hypothyroidism (acquired) Continue home levothyroxine  GERD (gastroesophageal reflux disease) Continue PPI and famotidine      Advance Care Planning:   Code Status: DNR   Consults: none  Family Communication: No family at bedside  Severity of Illness: The appropriate  patient status for this patient is OBSERVATION. Observation status is judged to be reasonable and necessary in order to provide the required intensity of service to ensure the patient's safety. The patient's presenting symptoms, physical exam findings, and initial radiographic and laboratory data in the context of their medical condition is felt to place them at decreased risk for further clinical deterioration. Furthermore, it is anticipated that the patient will be medically stable for discharge from the hospital within 2 midnights of admission.   Author: Orene Desanctis, DO 02/26/2022 2:13 AM  For on call review [email or web address].

## 2022-02-26 NOTE — Assessment & Plan Note (Signed)
Continue PPI and famotidine

## 2022-02-26 NOTE — Assessment & Plan Note (Signed)
s/p left upper lobectomy in 2019 and chemotherapy in remission. Follows with oncology at [facility]. -recommend repeat imaging following resolution of pneumonia

## 2022-02-26 NOTE — Assessment & Plan Note (Addendum)
Secondary to community-acquired pneumonia.  Remains on home 2 L for COPD but having worsening tachypnea and dyspnea. -Does not appear to be in acute COPD exacerbation but she has ongoing tobacco use.  Encourage cessation. -Treatment as below for pneumonia

## 2022-02-26 NOTE — Progress Notes (Signed)
Pharmacy Antibiotic Note  Tracy Park is a 66 y.o. female admitted on 02/25/2022 with pneumonia.   PMH significant for lung cancer and s/p left upper lobectomy (2019). Recent hospitalization in early June and was treated for left upper lobe infiltrate.  In the ED patient received Vancomycin 1gm IV and Zosyn 3.375gm IV x 1 dose each.  Upon admission, Pharmacy has been consulted for Cefepime dosing.  Plan: Cefepime 2gm IV q12h Azithromycin per MD Follow renal function   Height: 5\' 3"  (160 cm) Weight: 44 kg (97 lb) IBW/kg (Calculated) : 52.4  Temp (24hrs), Avg:98.5 F (36.9 C), Min:98.5 F (36.9 C), Max:98.5 F (36.9 C)  Recent Labs  Lab 02/25/22 1437  WBC 25.9*  CREATININE 0.58    Estimated Creatinine Clearance: 50 mL/min (by C-G formula based on SCr of 0.58 mg/dL).    Allergies  Allergen Reactions   Red Dye Hives and Itching   Oxycontin [Oxycodone Hcl]     Other reaction(s): Hallucinations   Aspirin Hives   Tape Rash    ONLY USE PAPER TAPE    Antimicrobials this admission: 6/20 Vancomycin x 1 6/20 Zosyn x 1  6/20 Cefepime >> 6/20 Azithromycin >>  Dose adjustments this admission:    Microbiology results:    Thank you for allowing pharmacy to be a part of this patient's care.  Everette Rank, PharmD 02/26/2022 2:18 AM

## 2022-02-26 NOTE — Assessment & Plan Note (Signed)
-  Ensure supplement

## 2022-02-26 NOTE — Progress Notes (Signed)
PROGRESS NOTE    Tracy Park  ZWC:[id number] DOB: 1955/11/14 DOA: 02/25/2022 PCP: Irving Shows, PA-C   Brief Narrative:  67 y.o. female with medical history significant of left upper lobe adenocarcinoma s/p left upper lobectomy in 2019 and chemotherapy, COPD with chronic hypoxic respiratory failure on 2 L, OSA, hypothyroidism, hyperlipidemia, GERD, ongoing tobacco use and recent admission from 02/08/2022-02/10/2022 at [facility] hospital for sepsis from pneumonia treated with IV antibiotics and subsequently discharged home on oral Omnicef, doxycycline and prednisone (unable to complete prednisone course because of GI upset) presented with worsening shortness of breath.  On presentation, she had a leukocytosis of 25,000.  CTA chest was negative for pulmonary embolism with coarse consolidation seen in the left upper chest likely pneumonia with mild patchy infiltrate in the right upper lung.  She was started on IV antibiotics.  Assessment & Plan:   Community acquired pneumonia Chronic respiratory failure with hypoxia COPD with possible exacerbation -Presented with worsening shortness of breath with CT findings as above.  COVID-19 and influenza testing negative on presentation -Continue cefepime and Zithromax.  Patient was recently hospitalized and treated for pneumonia and has she is currently on cefepime to cover for gram-negatives -Currently on 2 L oxygen via nasal cannula which she normally wears at home.  Acute respiratory failure has been ruled out -Continue current inhaled regimen.  Use nebs as needed.  Start Solu-Medrol 80 mg IV daily.  Adenocarcinoma of lung -Status post left upper lobectomy in 2019 and chemotherapy; currently in remission.  Follows up with oncology at [place] use, ongoing -Counseled regarding cessation  Severe protein-calorie malnutrition -Nutrition consult  Hypothyroidism -Continue levothyroxine  GERD -Continue PPI and famotidine    hyperlipidemia -continue statin    DVT prophylaxis: Lovenox Code Status: DNR Family Communication: None at bedside Disposition Plan: Status is: Observation The patient will require care spanning > 2 midnights and should be moved to inpatient because: Of need for IV antibiotics and steroids    Consultants: None  Procedures: None  Antimicrobials: Cefepime and Zithromax from 02/25/2022 onwards   Subjective: Patient seen and examined at bedside.  Does not feel better yet.  Complains of headache.  Breathing has not improved.  Denies overnight fever, chest pain, abdominal pain or vomiting.  Objective: Vitals:   02/26/22 0700 02/26/22 0800 02/26/22 0900 02/26/22 1000  BP: 109/75 116/77 113/73 98/65  Pulse: 80 88 69 61  Resp: 20 20 (!) 23 (!) 21  Temp:      TempSrc:      SpO2: 99% 98% 100% 90%  Weight:      Height:        Intake/Output Summary (Last 24 hours) at 02/26/2022 1022 Last data filed at 02/26/2022 0121 Gross per 24 hour  Intake 244.78 ml  Output --  Net 244.78 ml   Filed Weights   02/25/22 1403  Weight: 44 kg    Examination:  General exam: Appears calm and comfortable.  Currently on 2 L oxygen by nasal cannula.  Very thinly built.  Looks chronically ill and deconditioned. Respiratory system: Bilateral decreased breath sounds at bases with scattered crackles and wheezing.  Intermittent tachypnea Cardiovascular system: S1 & S2 heard, Rate controlled Gastrointestinal system: Abdomen is nondistended, soft and nontender. Normal bowel sounds heard. Extremities: No cyanosis, clubbing, edema  Central nervous system: Alert and oriented. No focal neurological deficits. Moving extremities Skin: No rashes, lesions or ulcers Psychiatry: Affect is mostly flat.  No signs of agitation.    Data  Reviewed: I have personally reviewed following labs and imaging studies  CBC: Recent Labs  Lab 02/25/22 1437 02/26/22 0429  WBC 25.9* 14.9*  HGB 11.7* 10.4*  HCT 36.6  32.8*  MCV 86.3 87.2  PLT 392 295   Basic Metabolic Panel: Recent Labs  Lab 02/25/22 1437  NA 135  K 3.7  CL 98  CO2 27  GLUCOSE 127*  BUN 15  CREATININE 0.58  CALCIUM 8.8*   GFR: Estimated Creatinine Clearance: 50 mL/min (by C-G formula based on SCr of 0.58 mg/dL). Liver Function Tests: No results for input(s): "AST", "ALT", "ALKPHOS", "BILITOT", "PROT", "ALBUMIN" in the last 168 hours. No results for input(s): "LIPASE", "AMYLASE" in the last 168 hours. No results for input(s): "AMMONIA" in the last 168 hours. Coagulation Profile: No results for input(s): "INR", "PROTIME" in the last 168 hours. Cardiac Enzymes: No results for input(s): "CKTOTAL", "CKMB", "CKMBINDEX", "TROPONINI" in the last 168 hours. BNP (last 3 results) No results for input(s): "PROBNP" in the last 8760 hours. HbA1C: No results for input(s): "HGBA1C" in the last 72 hours. CBG: No results for input(s): "GLUCAP" in the last 168 hours. Lipid Profile: No results for input(s): "CHOL", "HDL", "LDLCALC", "TRIG", "CHOLHDL", "LDLDIRECT" in the last 72 hours. Thyroid Function Tests: No results for input(s): "TSH", "T4TOTAL", "FREET4", "T3FREE", "THYROIDAB" in the last 72 hours. Anemia Panel: No results for input(s): "VITAMINB12", "FOLATE", "FERRITIN", "TIBC", "IRON", "RETICCTPCT" in the last 72 hours. Sepsis Labs: No results for input(s): "PROCALCITON", "LATICACIDVEN" in the last 168 hours.  Recent Results (from the past 240 hour(s))  Resp Panel by RT-PCR (Flu A&B, Covid) Anterior Nasal Swab     Status: None   Collection Time: 02/25/22  9:18 PM   Specimen: Anterior Nasal Swab  Result Value Ref Range Status   SARS Coronavirus 2 by RT PCR NEGATIVE NEGATIVE Final    Comment: (NOTE) SARS-CoV-2 target nucleic acids are NOT DETECTED.  The SARS-CoV-2 RNA is generally detectable in upper respiratory specimens during the acute phase of infection. The lowest concentration of SARS-CoV-2 viral copies this assay can  detect is 138 copies/mL. A negative result does not preclude SARS-Cov-2 infection and should not be used as the sole basis for treatment or other patient management decisions. A negative result may occur with  improper specimen collection/handling, submission of specimen other than nasopharyngeal swab, presence of viral mutation(s) within the areas targeted by this assay, and inadequate number of viral copies(<138 copies/mL). A negative result must be combined with clinical observations, patient history, and epidemiological information. The expected result is Negative.  Fact Sheet for Patients:  EntrepreneurPulse.com.au  Fact Sheet for Healthcare Providers:  IncredibleEmployment.be  This test is no t yet approved or cleared by the [place] FDA and  has been authorized for detection and/or diagnosis of SARS-CoV-2 by FDA under an Emergency Use Authorization (EUA). This EUA will remain  in effect (meaning this test can be used) for the duration of the COVID-19 declaration under Section 564(b)(1) of the Act, 21 U.S.C.section 360bbb-3(b)(1), unless the authorization is terminated  or revoked sooner.       Influenza A by PCR NEGATIVE NEGATIVE Final   Influenza B by PCR NEGATIVE NEGATIVE Final    Comment: (NOTE) The Xpert Xpress SARS-CoV-2/FLU/RSV plus assay is intended as an aid in the diagnosis of influenza from Nasopharyngeal swab specimens and should not be used as a sole basis for treatment. Nasal washings and aspirates are unacceptable for Xpert Xpress SARS-CoV-2/FLU/RSV testing.  Fact Sheet for Patients: EntrepreneurPulse.com.au  Fact Sheet for Healthcare Providers: IncredibleEmployment.be  This test is not yet approved or cleared by the [place] FDA and has been authorized for detection and/or diagnosis of SARS-CoV-2 by FDA under an Emergency Use Authorization (EUA). This EUA will remain in  effect (meaning this test can be used) for the duration of the COVID-19 declaration under Section 564(b)(1) of the Act, 21 U.S.C. section 360bbb-3(b)(1), unless the authorization is terminated or revoked.  Performed at [facility], [place] [address], [place], [place] [address]   Expectorated Sputum Assessment w Gram Stain, Rflx to Resp Cult     Status: None   Collection Time: 02/26/22  8:39 AM   Specimen: Expectorated Sputum  Result Value Ref Range Status   Specimen Description EXPECTORATED SPUTUM  Final   Special Requests NONE  Final   Sputum evaluation   Final    Sputum specimen not acceptable for testing.  Please recollect.   Performed at [facility], [place] [address] [place], [place] [address]    Report Status 02/26/2022 FINAL  Final         Radiology Studies: CT Angio Chest PE W/Cm &/Or Wo Cm  Result Date: 02/25/2022 CLINICAL DATA:  Pulmonary embolus suspected. Positive D-dimer. Nausea and vomiting, chills, and body aches for 2 days. EXAM: CT ANGIOGRAPHY CHEST WITH CONTRAST TECHNIQUE: Multidetector CT imaging of the chest was performed using the standard protocol during bolus administration of intravenous contrast. Multiplanar CT image reconstructions and MIPs were obtained to evaluate the vascular anatomy. RADIATION DOSE REDUCTION: This exam was performed according to the departmental dose-optimization program which includes automated exposure control, adjustment of the mA and/or kV according to patient size and/or use of iterative reconstruction technique. CONTRAST:  35mL OMNIPAQUE IOHEXOL 350 MG/ML SOLN COMPARISON:  04/09/2020 FINDINGS: Cardiovascular: Good opacification of the central and segmental pulmonary arteries. No focal filling defects. No evidence of significant pulmonary embolus. Normal heart size. No pericardial effusions. Normal caliber thoracic aorta. No dissection. Calcification of the aorta and coronary arteries.  Mediastinum/Nodes: No enlarged mediastinal, hilar, or axillary lymph nodes. Thyroid gland, trachea, and esophagus demonstrate no significant findings. Lungs/Pleura: Postoperative changes and volume loss on the left consistent with previous left upper lobectomy. Diffuse emphysematous changes in the lungs. Coarse consolidation with air bronchograms in the left upper chest likely representing pneumonia. Radiation change would be a less likely cause. Tree-in-bud infiltrates continue into the left lower lung and there is mild patchy infiltration in the right upper lung. Similar infiltrates were present in the previous study but in the left lower lung rather than the upper lung region. Prominent bronchial wall thickening with mucous plugging. Pleural thickening in the left upper chest is similar to prior study, possibly radiation change. No pleural effusion. No pneumothorax. Upper Abdomen: No acute abnormality. Musculoskeletal: No chest wall abnormality. No acute or significant osseous findings. Review of the MIP images confirms the above findings. IMPRESSION: 1. No evidence of significant pulmonary embolus. 2. Consolidation with air bronchograms in the left upper lung with peribronchial nodular infiltrates throughout the left lung and in the right upper lung. Mild pleural thickening. Bronchial wall thickening with mucous plugging. Changes likely represent pneumonia. Radiation change would be a less likely consideration. No focal masses identified but follow-up after resolution of acute process is recommended to exclude underlying lesion. 3. Diffuse emphysematous change in the lungs. 4. Aortic atherosclerosis. Electronically Signed   By: Lucienne Capers M.D.   On: 02/25/2022 22:49   DG Chest 2 View  Result Date: 02/25/2022 CLINICAL DATA:  Shortness of breath for several days, central chest pain especially with deep breathing; history of COPD, lung cancer, MI, smoker, pneumonia EXAM: CHEST - 2 VIEW COMPARISON:   08/20/2021 FINDINGS: Normal heart size, mediastinal contours, and pulmonary vascularity. Volume loss and scarring in the LEFT upper lobe. New LEFT upper lobe infiltrate consistent with pneumonia. Underlying emphysematous changes consistent with COPD. Resolution of previously identified RIGHT upper lobe and LEFT basilar infiltrates. No pleural effusion or pneumothorax. Osseous demineralization. Atherosclerotic calcifications aorta. IMPRESSION: COPD changes with postsurgical changes/scarring in LEFT upper lobe. New coexistent LEFT upper lobe infiltrate question pneumonia; follow-up imaging until resolution recommended to exclude underlying abnormalities including tumor. Aortic Atherosclerosis (ICD10-I70.0) and Emphysema (ICD10-J43.9). Electronically Signed   By: Lavonia Dana M.D.   On: 02/25/2022 16:22        Scheduled Meds:  busPIRone  5 mg Oral TID   cholecalciferol  1,000 Units Oral Daily   enoxaparin (LOVENOX) injection  30 mg Subcutaneous Q24H   famotidine  40 mg Oral Daily   feeding supplement  237 mL Oral BID BM   fluticasone furoate-vilanterol  1 puff Inhalation Daily   And   umeclidinium bromide  1 puff Inhalation Daily   levothyroxine  137 mcg Oral QAC breakfast   meloxicam  15 mg Oral Daily   methocarbamol  500 mg Oral Daily   oxybutynin  5 mg Oral TID   pantoprazole  40 mg Oral Daily   QUEtiapine  300 mg Oral QHS   rosuvastatin  20 mg Oral Daily   tamsulosin  0.4 mg Oral Daily   Continuous Infusions:  azithromycin     ceFEPime (MAXIPIME) IV Stopped (02/26/22 1007)          Aline August, MD [facility] 02/26/2022, 10:22 AM

## 2022-02-27 DIAGNOSIS — E43 Unspecified severe protein-calorie malnutrition: Secondary | ICD-10-CM | POA: Diagnosis present

## 2022-02-27 DIAGNOSIS — D649 Anemia, unspecified: Secondary | ICD-10-CM | POA: Diagnosis present

## 2022-02-27 DIAGNOSIS — C3490 Malignant neoplasm of unspecified part of unspecified bronchus or lung: Secondary | ICD-10-CM | POA: Diagnosis not present

## 2022-02-27 DIAGNOSIS — F4310 Post-traumatic stress disorder, unspecified: Secondary | ICD-10-CM | POA: Diagnosis present

## 2022-02-27 DIAGNOSIS — E876 Hypokalemia: Secondary | ICD-10-CM | POA: Diagnosis present

## 2022-02-27 DIAGNOSIS — J441 Chronic obstructive pulmonary disease with (acute) exacerbation: Secondary | ICD-10-CM | POA: Diagnosis not present

## 2022-02-27 DIAGNOSIS — E039 Hypothyroidism, unspecified: Secondary | ICD-10-CM | POA: Diagnosis present

## 2022-02-27 DIAGNOSIS — K5900 Constipation, unspecified: Secondary | ICD-10-CM | POA: Diagnosis present

## 2022-02-27 DIAGNOSIS — Z20822 Contact with and (suspected) exposure to covid-19: Secondary | ICD-10-CM | POA: Diagnosis present

## 2022-02-27 DIAGNOSIS — C3492 Malignant neoplasm of unspecified part of left bronchus or lung: Secondary | ICD-10-CM | POA: Diagnosis not present

## 2022-02-27 DIAGNOSIS — F319 Bipolar disorder, unspecified: Secondary | ICD-10-CM | POA: Diagnosis present

## 2022-02-27 DIAGNOSIS — F17210 Nicotine dependence, cigarettes, uncomplicated: Secondary | ICD-10-CM | POA: Diagnosis present

## 2022-02-27 DIAGNOSIS — Z79890 Hormone replacement therapy (postmenopausal): Secondary | ICD-10-CM | POA: Diagnosis not present

## 2022-02-27 DIAGNOSIS — Z902 Acquired absence of lung [part of]: Secondary | ICD-10-CM | POA: Diagnosis not present

## 2022-02-27 DIAGNOSIS — R64 Cachexia: Secondary | ICD-10-CM | POA: Diagnosis present

## 2022-02-27 DIAGNOSIS — J181 Lobar pneumonia, unspecified organism: Secondary | ICD-10-CM | POA: Diagnosis present

## 2022-02-27 DIAGNOSIS — Z681 Body mass index (BMI) 19 or less, adult: Secondary | ICD-10-CM | POA: Diagnosis not present

## 2022-02-27 DIAGNOSIS — J9611 Chronic respiratory failure with hypoxia: Secondary | ICD-10-CM

## 2022-02-27 DIAGNOSIS — I252 Old myocardial infarction: Secondary | ICD-10-CM | POA: Diagnosis not present

## 2022-02-27 DIAGNOSIS — Z90710 Acquired absence of both cervix and uterus: Secondary | ICD-10-CM | POA: Diagnosis not present

## 2022-02-27 DIAGNOSIS — Z85118 Personal history of other malignant neoplasm of bronchus and lung: Secondary | ICD-10-CM | POA: Diagnosis not present

## 2022-02-27 DIAGNOSIS — Z9221 Personal history of antineoplastic chemotherapy: Secondary | ICD-10-CM | POA: Diagnosis not present

## 2022-02-27 DIAGNOSIS — K219 Gastro-esophageal reflux disease without esophagitis: Secondary | ICD-10-CM | POA: Diagnosis present

## 2022-02-27 DIAGNOSIS — Z66 Do not resuscitate: Secondary | ICD-10-CM | POA: Diagnosis present

## 2022-02-27 DIAGNOSIS — Z9981 Dependence on supplemental oxygen: Secondary | ICD-10-CM | POA: Diagnosis not present

## 2022-02-27 DIAGNOSIS — E785 Hyperlipidemia, unspecified: Secondary | ICD-10-CM | POA: Diagnosis present

## 2022-02-27 DIAGNOSIS — J189 Pneumonia, unspecified organism: Secondary | ICD-10-CM | POA: Diagnosis present

## 2022-02-27 LAB — BASIC METABOLIC PANEL
Anion gap: 9 (ref 5–15)
BUN: 22 mg/dL (ref 8–23)
CO2: 29 mmol/L (ref 22–32)
Calcium: 9.3 mg/dL (ref 8.9–10.3)
Chloride: 104 mmol/L (ref 98–111)
Creatinine, Ser: 0.54 mg/dL (ref 0.44–1.00)
GFR, Estimated: >60 mL/min (ref >60)
Glucose, Bld: 114 mg/dL — ABNORMAL HIGH (ref 70–99)
Potassium: 3.7 mmol/L (ref 3.5–5.1)
Sodium: 142 mmol/L (ref 135–145)

## 2022-02-27 LAB — CBC WITH DIFFERENTIAL/PLATELET
Abs Immature Granulocytes: 0.06 10*3/uL (ref 0.00–0.07)
Basophils Absolute: 0 10*3/uL (ref 0.0–0.1)
Basophils Relative: 0 %
Eosinophils Absolute: 0 10*3/uL (ref 0.0–0.5)
Eosinophils Relative: 0 %
HCT: 30.5 % — ABNORMAL LOW (ref 36.0–46.0)
Hemoglobin: 9.4 g/dL — ABNORMAL LOW (ref 12.0–15.0)
Immature Granulocytes: 1 %
Lymphocytes Relative: 15 %
Lymphs Abs: 1.6 10*3/uL (ref 0.7–4.0)
MCH: 27 pg (ref 26.0–34.0)
MCHC: 30.8 g/dL (ref 30.0–36.0)
MCV: 87.6 fL (ref 80.0–100.0)
Monocytes Absolute: 0.6 10*3/uL (ref 0.1–1.0)
Monocytes Relative: 6 %
Neutro Abs: 8.4 10*3/uL — ABNORMAL HIGH (ref 1.7–7.7)
Neutrophils Relative %: 78 %
Platelets: 333 10*3/uL (ref 150–400)
RBC: 3.48 MIL/uL — ABNORMAL LOW (ref 3.87–5.11)
RDW: 18.2 % — ABNORMAL HIGH (ref 11.5–15.5)
WBC: 10.6 10*3/uL — ABNORMAL HIGH (ref 4.0–10.5)
nRBC: 0 % (ref 0.0–0.2)

## 2022-02-27 LAB — MAGNESIUM: Magnesium: 1.9 mg/dL (ref 1.7–2.4)

## 2022-02-27 MED ORDER — GUAIFENESIN ER 600 MG PO TB12
600.0000 mg | ORAL_TABLET | Freq: Two times a day (BID) | ORAL | Status: DC
  Administered 2022-02-27 – 2022-03-01 (×5): 600 mg via ORAL
  Filled 2022-02-27 (×5): qty 1

## 2022-02-27 MED ORDER — BUTALBITAL-APAP-CAFFEINE 50-325-40 MG PO TABS
1.0000 | ORAL_TABLET | Freq: Four times a day (QID) | ORAL | Status: DC | PRN
  Administered 2022-02-27 – 2022-02-28 (×3): 1 via ORAL
  Filled 2022-02-27 (×3): qty 1

## 2022-02-27 NOTE — Progress Notes (Signed)
  Transition of Care (TOC) Screening Note   Patient Details  Name: Tracy Park Date of Birth: 05-23-1956   Transition of Care [facility]) CM/SW Contact:    Vassie Moselle, LCSW Phone Number: 02/27/2022, 10:47 AM    Transition of Care Department [facility]) has reviewed patient and no TOC needs have been identified at this time. We will continue to monitor patient advancement through interdisciplinary progression rounds. If new patient transition needs arise, please place a TOC consult.

## 2022-02-27 NOTE — Progress Notes (Signed)
PROGRESS NOTE    Tracy Park  SHF:[id number] DOB: November 30, 1955 DOA: 02/25/2022 PCP: Irving Shows, PA-C     Brief Narrative:    left upper lobe adenocarcinoma s/p left upper lobectomy in 2019 and chemotherapy, COPD with chronic hypoxic respiratory failure on 2 L, OSA, hypothyroidism, hyperlipidemia and GERD who presents with worsening shortness of breath.   Patient recently admitted from 02/08/22 -02/10/22 at [facility] after presenting with hypoxia in the 70s with productive cough and shortness of breath.  She was septic with x-ray finding of left upper lobe infiltrate.  Initially treated with IV vancomycin, azithromycin and Zosyn and eventually discharged home on Omnicef, doxycycline, and prednisone. Completed her antibiotic course but unable to tolerate prednisone due to GI upset. Continued to feel fatigue and dyspneic with any exertion. Has productive cough. Continue to smoke 2-3 cigarettes daily.    CTA chest today is negative for pulmonary embolism.  Coarse consolidation seen in left upper chest likely pneumonia with mild patchy infiltrate in the right upper lung.     She was afebrile, tachycardic and mildly tachypneic on her home 2 L via nasal cannula.  Has leukocytosis of 25 K.  BMP is largely unremarkable  Subjective:  On 2liters Cough, feel congested, more on the left,  No fever, no chest pain No n/v No edema Frontal headache  Assessment & Plan:  Principal Problem:   Pneumonia Active Problems:   GERD (gastroesophageal reflux disease)   Hypothyroidism (acquired)   Nicotine abuse   CAP (community acquired pneumonia)   Protein-calorie malnutrition, severe   Adenocarcinoma of lung (HCC)   Leukocytosis   COPD with acute exacerbation (HCC)   Chronic respiratory failure with hypoxia (HCC)    Assessment and Plan:  Lobar pneumonia/COPD exacerbation/chronic hypoxic respiratory failure -CTA no PE, showed "Consolidation with air bronchograms in the left upper lung  with peribronchial nodular infiltrates throughout the left lung and in the right upper lung. Mild pleural thickening. Bronchial wall thickening with mucous plugging. - urine Legionella in process,urine strep pneumo negative  -sputum culture sample not acceptable for testing  -started on IV vancomycin and Zosyn in the ED.  Has no history of positive MRSA swab.  Abx de-escalate to cefepime and azithromycin. -continue iv steroids and nebs, add mucinex, consider chest PT if no improvement -Recommend repeat imaging following resolution of symptoms  Adenocarcinoma of lung ([place])  s/p left upper lobectomy in 2019 and chemotherapy in remission. Follows with oncology at [facility]. -recommend repeat imaging following resolution of pneumonia   Hypothyroidism (acquired) Continue home levothyroxine  GERD (gastroesophageal reflux disease) Continue PPI and famotidine  Protein-calorie malnutrition, severe Body mass index is 16.05 kg/m. underweight -Ensure supplement      I have Reviewed nursing notes, Vitals, pain scores, I/o's, Lab results and  imaging results since pt's last encounter, details please see discussion above  I ordered the following labs:  Unresulted Labs (From admission, onward)     Start     Ordered   02/26/22 0200  Legionella Pneumophila Serogp 1 Ur Ag  (COPD / Pneumonia / Cellulitis / Lower Extremity Wound)  Once,   R        02/26/22 0200             DVT prophylaxis: enoxaparin (LOVENOX) injection 30 mg Start: 02/26/22 1000   Code Status:   Code Status: DNR  Family Communication: patient Disposition:    Dispo: The patient is from: home  Anticipated d/c is to: home              Anticipated d/c date is: pending respiratory status improvement  Antimicrobials:   Anti-infectives (From admission, onward)    Start     Dose/Rate Route Frequency Ordered Stop   02/26/22 0800  ceFEPIme (MAXIPIME) 2 g in sodium chloride 0.9 % 100 mL IVPB        2  g 200 mL/hr over 30 Minutes Intravenous Every 12 hours 02/26/22 0218     02/26/22 0700  azithromycin (ZITHROMAX) 500 mg in sodium chloride 0.9 % 250 mL IVPB        500 mg 250 mL/hr over 60 Minutes Intravenous Every 24 hours 02/26/22 0203     02/25/22 2315  piperacillin-tazobactam (ZOSYN) IVPB 3.375 g        3.375 g 100 mL/hr over 30 Minutes Intravenous  Once 02/25/22 2314 02/26/22 0016   02/25/22 2315  vancomycin (VANCOCIN) IVPB 1000 mg/200 mL premix        1,000 mg 200 mL/hr over 60 Minutes Intravenous  Once 02/25/22 2314 02/26/22 0121           Objective: Vitals:   02/27/22 0600 02/27/22 0854 02/27/22 1417 02/27/22 2138  BP:   (!) 92/51 105/66  Pulse:   85 (!) 59  Resp:  15 20 18   Temp:   97.9 F (36.6 C) 97.8 F (36.6 C)  TempSrc:   Oral   SpO2:  99% 100% 100%  Weight: 41.1 kg     Height:        Intake/Output Summary (Last 24 hours) at 02/27/2022 2204 Last data filed at 02/27/2022 1423 Gross per 24 hour  Intake 1205.76 ml  Output --  Net 1205.76 ml   Filed Weights   02/25/22 1403 02/27/22 0600  Weight: 44 kg 41.1 kg    Examination:  General exam: weak, aaox3, 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 Mild wheezing, Respiratory effort normal. Cardiovascular system:  RRR.  Gastrointestinal system: Abdomen is nondistended, soft and nontender.  Normal bowel sounds heard. Central nervous system: Alert and oriented. No focal neurological deficits. Extremities:  no edema Skin: No rashes, lesions or ulcers Psychiatry: Judgement and insight appear normal. Mood & affect appropriate.     Data Reviewed: I have personally reviewed  labs and visualized  imaging studies since the last encounter and formulate the plan        Scheduled Meds:  busPIRone  5 mg Oral TID   cholecalciferol  1,000 Units Oral Daily   enoxaparin (LOVENOX) injection  30 mg Subcutaneous Q24H   famotidine  40 mg Oral Daily   feeding  supplement  237 mL Oral BID BM   fluticasone furoate-vilanterol  1 puff Inhalation Daily   And   umeclidinium bromide  1 puff Inhalation Daily   guaiFENesin  600 mg Oral BID   levothyroxine  137 mcg Oral QAC breakfast   meloxicam  15 mg Oral Daily   methocarbamol  500 mg Oral Daily   methylPREDNISolone (SOLU-MEDROL) injection  80 mg Intravenous Q24H   oxybutynin  5 mg Oral TID   pantoprazole  40 mg Oral Daily   QUEtiapine  300 mg Oral QHS   rosuvastatin  20 mg Oral Daily   tamsulosin  0.4 mg Oral Daily   Continuous Infusions:  azithromycin Stopped (02/27/22 0721)   ceFEPime (MAXIPIME) IV 2 g (02/27/22 2010)     LOS:  0 days      Tracy Reasons, MD PhD FACP [facility]  Available via Epic secure chat 7am-7pm for nonurgent issues Please page for urgent issues To page the attending provider between 7A-7P or the covering provider during after hours 7P-7A, please log into the web site [email or web address] and access using universal [place] password for that web site. If you do not have the password, please call the hospital operator.    02/27/2022, 10:04 PM

## 2022-02-28 ENCOUNTER — Inpatient Hospital Stay (HOSPITAL_COMMUNITY): Payer: 59

## 2022-02-28 DIAGNOSIS — J9611 Chronic respiratory failure with hypoxia: Secondary | ICD-10-CM | POA: Diagnosis not present

## 2022-02-28 DIAGNOSIS — C3492 Malignant neoplasm of unspecified part of left bronchus or lung: Secondary | ICD-10-CM | POA: Diagnosis not present

## 2022-02-28 DIAGNOSIS — J181 Lobar pneumonia, unspecified organism: Secondary | ICD-10-CM | POA: Diagnosis not present

## 2022-02-28 DIAGNOSIS — J441 Chronic obstructive pulmonary disease with (acute) exacerbation: Secondary | ICD-10-CM | POA: Diagnosis not present

## 2022-02-28 LAB — BASIC METABOLIC PANEL
Anion gap: 7 (ref 5–15)
BUN: 26 mg/dL — ABNORMAL HIGH (ref 8–23)
CO2: 28 mmol/L (ref 22–32)
Calcium: 8.9 mg/dL (ref 8.9–10.3)
Chloride: 105 mmol/L (ref 98–111)
Creatinine, Ser: 0.51 mg/dL (ref 0.44–1.00)
GFR, Estimated: >60 mL/min (ref >60)
Glucose, Bld: 91 mg/dL (ref 70–99)
Potassium: 3.3 mmol/L — ABNORMAL LOW (ref 3.5–5.1)
Sodium: 140 mmol/L (ref 135–145)

## 2022-02-28 LAB — CBC WITH DIFFERENTIAL/PLATELET
Abs Immature Granulocytes: 0.06 10*3/uL (ref 0.00–0.07)
Basophils Absolute: 0 10*3/uL (ref 0.0–0.1)
Basophils Relative: 0 %
Eosinophils Absolute: 0 10*3/uL (ref 0.0–0.5)
Eosinophils Relative: 0 %
HCT: 29 % — ABNORMAL LOW (ref 36.0–46.0)
Hemoglobin: 8.9 g/dL — ABNORMAL LOW (ref 12.0–15.0)
Immature Granulocytes: 1 %
Lymphocytes Relative: 22 %
Lymphs Abs: 2.6 10*3/uL (ref 0.7–4.0)
MCH: 27.3 pg (ref 26.0–34.0)
MCHC: 30.7 g/dL (ref 30.0–36.0)
MCV: 89 fL (ref 80.0–100.0)
Monocytes Absolute: 0.6 10*3/uL (ref 0.1–1.0)
Monocytes Relative: 5 %
Neutro Abs: 8.3 10*3/uL — ABNORMAL HIGH (ref 1.7–7.7)
Neutrophils Relative %: 72 %
Platelets: 312 10*3/uL (ref 150–400)
RBC: 3.26 MIL/uL — ABNORMAL LOW (ref 3.87–5.11)
RDW: 18.3 % — ABNORMAL HIGH (ref 11.5–15.5)
WBC: 11.5 10*3/uL — ABNORMAL HIGH (ref 4.0–10.5)
nRBC: 0 % (ref 0.0–0.2)

## 2022-02-28 LAB — TROPONIN I (HIGH SENSITIVITY): Troponin I (High Sensitivity): 5 ng/L (ref <18)

## 2022-02-28 MED ORDER — POTASSIUM CHLORIDE 20 MEQ PO PACK
40.0000 meq | PACK | Freq: Once | ORAL | Status: AC
  Administered 2022-02-28: 40 meq via ORAL
  Filled 2022-02-28: qty 2

## 2022-02-28 MED ORDER — METHYLPREDNISOLONE SODIUM SUCC 40 MG IJ SOLR
40.0000 mg | INTRAMUSCULAR | Status: DC
  Administered 2022-03-01: 40 mg via INTRAVENOUS
  Filled 2022-02-28: qty 1

## 2022-02-28 MED ORDER — POLYETHYLENE GLYCOL 3350 17 G PO PACK
17.0000 g | PACK | Freq: Every day | ORAL | Status: DC
Start: 2022-02-28 — End: 2022-03-01
  Administered 2022-02-28 – 2022-03-01 (×2): 17 g via ORAL
  Filled 2022-02-28 (×2): qty 1

## 2022-02-28 NOTE — Progress Notes (Signed)
Initial Nutrition Assessment  DOCUMENTATION CODES:   Severe malnutrition in context of chronic illness  INTERVENTION:   -Encouraged PO intakes  -Ensure Plus High Protein po BID, each supplement provides 350 kcal and 20 grams of protein.   -Multivitamin with minerals daily  NUTRITION DIAGNOSIS:   Severe Malnutrition related to chronic illness as evidenced by energy intake < or equal to 75% for > or equal to 1 month, moderate fat depletion, severe muscle depletion.  GOAL:   Patient will meet greater than or equal to 90% of their needs  MONITOR:   PO intake, Supplement acceptance, Labs, Weight trends, I & O's  REASON FOR ASSESSMENT:   Malnutrition Screening Tool    ASSESSMENT:   66 y.o. female with medical history significant of left upper lobe adenocarcinoma s/p left upper lobectomy in 2019 and chemotherapy, COPD with chronic hypoxic respiratory failure on 2 L, OSA, hypothyroidism, hyperlipidemia, GERD, ongoing tobacco use and recent admission from 02/08/2022-02/10/2022 at [facility] hospital for sepsis from pneumonia treated with IV antibiotics and subsequently discharged home on oral Omnicef, doxycycline and prednisone (unable to complete prednisone course because of GI upset) presented with worsening shortness of breath.  Patient in room eating her breakfast of bagel with cream cheese and cheerios with milk. Pt states her appetite is good today. She has not been eating at home. Reports worrying about her daughter who is hospitalized. RD provided active listening when pt was speaking of her late husband. Pt still mourning his loss. States she takes a daily women's MVI and drinks 3-4  Boosts daily. Reports history of disordered eating. She is working with a Transport planner.  Pt has been ordered Ensure supplements, drank one this morning. Will continue.  Per weight records, pt has 9 lbs since 08/16/21 (9% wt loss x 6.5 months, insignificant for time frame).  Medications: Vitamin D, Pepcid,  Miralax, KLOR-CON  Labs reviewed: Low K   NUTRITION - FOCUSED PHYSICAL EXAM:  Flowsheet Row Most Recent Value  Orbital Region Moderate depletion  Upper Arm Region Severe depletion  Thoracic and Lumbar Region Moderate depletion  Buccal Region Moderate depletion  Temple Region Moderate depletion  Clavicle Bone Region Severe depletion  Clavicle and Acromion Bone Region Moderate depletion  Scapular Bone Region Moderate depletion  Dorsal Hand Severe depletion  Patellar Region Severe depletion  Anterior Thigh Region Severe depletion  Posterior Calf Region Severe depletion  Edema (RD Assessment) None  Hair Reviewed  Eyes Reviewed  Mouth Reviewed  Skin Reviewed  Nails Reviewed       Diet Order:   Diet Order             Diet regular Room service appropriate? Yes; Fluid consistency: Thin  Diet effective now                   EDUCATION NEEDS:   Education needs have been addressed  Skin:  Skin Assessment: Reviewed RN Assessment  Last BM:  6/20  Height:   Ht Readings from Last 1 Encounters:  02/25/22 5\' 3"  (1.6 m)    Weight:   Wt Readings from Last 1 Encounters:  02/28/22 45.3 kg    BMI:  Body mass index is 17.69 kg/m.  Estimated Nutritional Needs:   Kcal:  1600-1800  Protein:  80-95g  Fluid:  1.8L/day  Clayton Bibles, MS, RD, LDN Inpatient Clinical Dietitian Contact information available via Amion

## 2022-02-28 NOTE — Progress Notes (Signed)
PROGRESS NOTE    Tracy Park  ZDG:[id number] DOB: 28-Nov-1955 DOA: 02/25/2022 PCP: Irving Shows, PA-C     Brief Narrative:    left upper lobe adenocarcinoma s/p left upper lobectomy in 2019 and chemotherapy, COPD with chronic hypoxic respiratory failure on 2 L, OSA, hypothyroidism, hyperlipidemia and GERD who presents with worsening shortness of breath.   Patient recently admitted from 02/08/22 -02/10/22 at [facility] after presenting with hypoxia in the 70s with productive cough and shortness of breath.  She was septic with x-ray finding of left upper lobe infiltrate.  Initially treated with IV vancomycin, azithromycin and Zosyn and eventually discharged home on Omnicef, doxycycline, and prednisone. Completed her antibiotic course but unable to tolerate prednisone due to GI upset. Continued to feel fatigue and dyspneic with any exertion. Has productive cough. Continue to smoke 2-3 cigarettes daily.    CTA chest today is negative for pulmonary embolism.  Coarse consolidation seen in left upper chest likely pneumonia with mild patchy infiltrate in the right upper lung.     She was afebrile, tachycardic and mildly tachypneic on her home 2 L via nasal cannula.  Has leukocytosis of 25 K.  BMP is largely unremarkable  Subjective:  Had one episode of left side chest this am, she reports now Feeling better  On 2liters  Reports no bm for several days    Assessment & Plan:  Principal Problem:   Pneumonia Active Problems:   GERD (gastroesophageal reflux disease)   Hypothyroidism (acquired)   Nicotine abuse   CAP (community acquired pneumonia)   Protein-calorie malnutrition, severe   Adenocarcinoma of lung ([place])   Leukocytosis   COPD with acute exacerbation ([place])   Chronic respiratory failure with hypoxia (HCC)    Assessment and Plan:  Lobar pneumonia/COPD exacerbation/chronic hypoxic respiratory failure -CTA no PE, showed "Consolidation with air bronchograms in the left  upper lung with peribronchial nodular infiltrates throughout the left lung and in the right upper lung. Mild pleural thickening. Bronchial wall thickening with mucous plugging. - urine Legionella in process,urine strep pneumo negative  -sputum culture sample not acceptable for testing  -started on IV vancomycin and Zosyn in the ED.  Has no history of positive MRSA swab.  Abx de-escalate to cefepime and azithromycin. -improving, taper  steroids , continue  nebs, mucinex, flutter valves  -Recommend repeat imaging following resolution of symptoms  Adenocarcinoma of lung (HCC)  s/p left upper lobectomy in 2019 and chemotherapy in remission. Follows with oncology at [facility]. -recommend repeat imaging following resolution of pneumonia   Hypothyroidism (acquired) Continue home levothyroxine  GERD (gastroesophageal reflux disease) Continue PPI and famotidine  Protein-calorie malnutrition, severe Body mass index is 17.69 kg/m. underweight -Ensure supplement   Constipation Start miralax  Hypokalemia Replace K, check mag  Normocytic anemia No sign of bleeding Monitor hemoglobin   I have Reviewed nursing notes, Vitals, pain scores, I/o's, Lab results and  imaging results since pt's last encounter, details please see discussion above  I ordered the following labs:  Unresulted Labs (From admission, onward)     Start     Ordered   03/01/22 0500  CBC with Differential/Platelet  Tomorrow morning,   R       Question:  Specimen collection method  Answer:  Lab=Lab collect   02/28/22 1225   03/01/22 9518  Basic metabolic panel  Tomorrow morning,   R       Question:  Specimen collection method  Answer:  Lab=Lab collect   02/28/22  1225   03/01/22 0500  Magnesium  Tomorrow morning,   R       Question:  Specimen collection method  Answer:  Lab=Lab collect   02/28/22 1225   02/26/22 0200  Legionella Pneumophila Serogp 1 Ur Ag  (COPD / Pneumonia / Cellulitis / Lower Extremity Wound)   Once,   R        02/26/22 0200             DVT prophylaxis: enoxaparin (LOVENOX) injection 30 mg Start: 02/26/22 1000   Code Status:   Code Status: DNR  Family Communication: patient Disposition:    Dispo: The patient is from: home              Anticipated d/c is to: home              Anticipated d/c date is: Likely on 6/23  Antimicrobials:   Anti-infectives (From admission, onward)    Start     Dose/Rate Route Frequency Ordered Stop   02/26/22 0800  ceFEPIme (MAXIPIME) 2 g in sodium chloride 0.9 % 100 mL IVPB        2 g 200 mL/hr over 30 Minutes Intravenous Every 12 hours 02/26/22 0218     02/26/22 0700  azithromycin (ZITHROMAX) 500 mg in sodium chloride 0.9 % 250 mL IVPB        500 mg 250 mL/hr over 60 Minutes Intravenous Every 24 hours 02/26/22 0203     02/25/22 2315  piperacillin-tazobactam (ZOSYN) IVPB 3.375 g        3.375 g 100 mL/hr over 30 Minutes Intravenous  Once 02/25/22 2314 02/26/22 0016   02/25/22 2315  vancomycin (VANCOCIN) IVPB 1000 mg/200 mL premix        1,000 mg 200 mL/hr over 60 Minutes Intravenous  Once 02/25/22 2314 02/26/22 0121           Objective: Vitals:   02/27/22 2138 02/28/22 0441 02/28/22 0719 02/28/22 0903  BP: 105/66 107/77 113/75   Pulse: (!) 59 72 77   Resp: 18 20 16 17   Temp: 97.8 F (36.6 C) 97.6 F (36.4 C) (!) 97.5 F (36.4 C)   TempSrc:      SpO2: 100% 100% 100% 100%  Weight:  45.3 kg    Height:        Intake/Output Summary (Last 24 hours) at 02/28/2022 1708 Last data filed at 02/28/2022 1200 Gross per 24 hour  Intake 1140 ml  Output --  Net 1140 ml   Filed Weights   02/25/22 1403 02/27/22 0600 02/28/22 0441  Weight: 44 kg 41.1 kg 45.3 kg    Examination:  General exam: weak, aaox3, 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 Cardiovascular system:  RRR.  Gastrointestinal system: Abdomen is  nondistended, soft and nontender.  Normal bowel sounds heard. Central nervous system: Alert and oriented. No focal neurological deficits. Extremities:  no edema Skin: No rashes, lesions or ulcers Psychiatry: Judgement and insight appear normal. Mood & affect appropriate.     Data Reviewed: I have personally reviewed  labs and visualized  imaging studies since the last encounter and formulate the plan        Scheduled Meds:  busPIRone  5 mg Oral TID   cholecalciferol  1,000 Units Oral Daily   enoxaparin (LOVENOX) injection  30 mg Subcutaneous Q24H   famotidine  40 mg Oral Daily   feeding supplement  237 mL Oral BID BM   fluticasone furoate-vilanterol  1 puff Inhalation Daily   And   umeclidinium bromide  1 puff Inhalation Daily   guaiFENesin  600 mg Oral BID   levothyroxine  137 mcg Oral QAC breakfast   meloxicam  15 mg Oral Daily   methocarbamol  500 mg Oral Daily   [START ON 03/01/2022] methylPREDNISolone (SOLU-MEDROL) injection  40 mg Intravenous Q24H   oxybutynin  5 mg Oral TID   pantoprazole  40 mg Oral Daily   polyethylene glycol  17 g Oral Daily   QUEtiapine  300 mg Oral QHS   rosuvastatin  20 mg Oral Daily   tamsulosin  0.4 mg Oral Daily   Continuous Infusions:  azithromycin 500 mg (02/28/22 0617)   ceFEPime (MAXIPIME) IV 2 g (02/28/22 0750)     LOS: 1 day      Florencia Reasons, MD PhD FACP [facility]  Available via Epic secure chat 7am-7pm for nonurgent issues Please page for urgent issues To page the attending provider between 7A-7P or the covering provider during after hours 7P-7A, please log into the web site [email or web address] and access using universal [place] password for that web site. If you do not have the password, please call the hospital operator.    02/28/2022, 5:08 PM

## 2022-03-01 DIAGNOSIS — C3490 Malignant neoplasm of unspecified part of unspecified bronchus or lung: Secondary | ICD-10-CM

## 2022-03-01 DIAGNOSIS — E876 Hypokalemia: Secondary | ICD-10-CM

## 2022-03-01 LAB — CBC WITH DIFFERENTIAL/PLATELET
Abs Immature Granulocytes: 0.05 10*3/uL (ref 0.00–0.07)
Basophils Absolute: 0 10*3/uL (ref 0.0–0.1)
Basophils Relative: 0 %
Eosinophils Absolute: 0 10*3/uL (ref 0.0–0.5)
Eosinophils Relative: 0 %
HCT: 28.7 % — ABNORMAL LOW (ref 36.0–46.0)
Hemoglobin: 9 g/dL — ABNORMAL LOW (ref 12.0–15.0)
Immature Granulocytes: 0 %
Lymphocytes Relative: 25 %
Lymphs Abs: 2.8 10*3/uL (ref 0.7–4.0)
MCH: 27.8 pg (ref 26.0–34.0)
MCHC: 31.4 g/dL (ref 30.0–36.0)
MCV: 88.6 fL (ref 80.0–100.0)
Monocytes Absolute: 0.6 10*3/uL (ref 0.1–1.0)
Monocytes Relative: 6 %
Neutro Abs: 7.9 10*3/uL — ABNORMAL HIGH (ref 1.7–7.7)
Neutrophils Relative %: 69 %
Platelets: 317 10*3/uL (ref 150–400)
RBC: 3.24 MIL/uL — ABNORMAL LOW (ref 3.87–5.11)
RDW: 18.3 % — ABNORMAL HIGH (ref 11.5–15.5)
WBC: 11.4 10*3/uL — ABNORMAL HIGH (ref 4.0–10.5)
nRBC: 0 % (ref 0.0–0.2)

## 2022-03-01 LAB — BASIC METABOLIC PANEL
Anion gap: 6 (ref 5–15)
BUN: 28 mg/dL — ABNORMAL HIGH (ref 8–23)
CO2: 29 mmol/L (ref 22–32)
Calcium: 9 mg/dL (ref 8.9–10.3)
Chloride: 107 mmol/L (ref 98–111)
Creatinine, Ser: 0.46 mg/dL (ref 0.44–1.00)
GFR, Estimated: >60 mL/min (ref >60)
Glucose, Bld: 89 mg/dL (ref 70–99)
Potassium: 3.8 mmol/L (ref 3.5–5.1)
Sodium: 142 mmol/L (ref 135–145)

## 2022-03-01 LAB — LEGIONELLA PNEUMOPHILA SEROGP 1 UR AG: L. pneumophila Serogp 1 Ur Ag: NEGATIVE

## 2022-03-01 LAB — MAGNESIUM: Magnesium: 1.7 mg/dL (ref 1.7–2.4)

## 2022-03-01 MED ORDER — AMOXICILLIN-POT CLAVULANATE 875-125 MG PO TABS: 1.0000 | ORAL_TABLET | Freq: Two times a day (BID) | ORAL | 0 refills | Status: AC

## 2022-03-01 MED ORDER — MAGNESIUM OXIDE 400 MG PO CAPS: 400.0000 mg | ORAL_CAPSULE | Freq: Every day | ORAL | 0 refills | Status: DC

## 2022-03-01 MED ORDER — MAGNESIUM SULFATE IN D5W 1-5 GM/100ML-% IV SOLN
1.0000 g | Freq: Once | INTRAVENOUS | Status: AC
  Administered 2022-03-01: 1 g via INTRAVENOUS
  Filled 2022-03-01: qty 100

## 2022-03-03 ENCOUNTER — Emergency Department (HOSPITAL_COMMUNITY): Payer: 59

## 2022-03-03 ENCOUNTER — Emergency Department (HOSPITAL_COMMUNITY)
Admission: EM | Admit: 2022-03-03 | Discharge: 2022-03-03 | Disposition: A | Discharge status: Home / Self Care | Payer: 59 | Attending: Emergency Medicine | Admitting: Emergency Medicine

## 2022-03-03 ENCOUNTER — Other Ambulatory Visit: Payer: Self-pay

## 2022-03-03 DIAGNOSIS — M79631 Pain in right forearm: Secondary | ICD-10-CM | POA: Diagnosis present

## 2022-03-03 DIAGNOSIS — W01198A Fall on same level from slipping, tripping and stumbling with subsequent striking against other object, initial encounter: Secondary | ICD-10-CM | POA: Diagnosis not present

## 2022-03-03 DIAGNOSIS — S5011XA Contusion of right forearm, initial encounter: Secondary | ICD-10-CM

## 2022-03-03 MED ORDER — ACETAMINOPHEN 325 MG PO TABS
650.0000 mg | ORAL_TABLET | Freq: Four times a day (QID) | ORAL | Status: DC | PRN
  Administered 2022-03-03: 650 mg via ORAL
  Filled 2022-03-03: qty 2

## 2022-03-03 MED ORDER — HYDROCODONE-ACETAMINOPHEN 5-325 MG PO TABS: 1.0000 | ORAL_TABLET | ORAL | 0 refills | Status: DC | PRN

## 2022-03-03 NOTE — ED Provider Notes (Signed)
[facility] EMERGENCY DEPARTMENT Provider Note   CSN: [id number] Arrival date & time: 03/03/22  0141     History  Chief Complaint  Patient presents with   Fall    Tracy Park is a 66 y.o. female.  Patient reports she went to stand up and fell backwards striking her arm.  Patient reports that she may have hit the back of her head but she does not have any pain she is not on any blood thinners.  Patient reports her forearm is sore she has pain with moving her wrist.  Patient denies any other areas of injuries  The history is provided by the patient. No language interpreter was used.  Fall This is a new problem. The current episode started yesterday. The problem occurs constantly.       Home Medications Prior to Admission medications   Medication Sig Start Date End Date Taking? Authorizing Provider  busPIRone (BUSPAR) 5 MG tablet Take 5 mg by mouth 3 (three) times daily.   Yes [provider]  Cholecalciferol 25 MCG (1000 UT) tablet Take 1,000 Units by mouth daily.   Yes [provider]  famotidine (PEPCID) 40 MG tablet Take 40 mg by mouth daily. 02/14/20  Yes [provider]  HYDROcodone-acetaminophen (NORCO/VICODIN) 5-325 MG tablet Take 1 tablet by mouth every 4 (four) hours as needed for moderate pain. 03/03/22 03/03/23 Yes Elson Areas, PA-C  ipratropium-albuterol (DUONEB) 0.5-2.5 (3) MG/3ML SOLN Take 3 mLs by nebulization every 6 (six) hours as needed (shortness of breath). 02/10/22  Yes [provider]  levothyroxine (SYNTHROID) 137 MCG tablet Take 137 mcg by mouth daily before breakfast. 03/14/20  Yes [provider]  Magnesium Oxide 400 MG CAPS Take 1 capsule (400 mg total) by mouth daily at 12 noon. Patient taking differently: Take 400 mg by mouth daily. 03/01/22  Yes Albertine Grates, MD  meloxicam (MOBIC) 15 MG tablet Take 15 mg by mouth daily. 04/07/20  Yes [provider]  methocarbamol (ROBAXIN) 500 MG  tablet Take 500 mg by mouth daily. 02/10/20  Yes [provider]  Multiple Vitamins-Minerals (CENTRUM WOMEN) TABS Take 1 tablet by mouth daily.   Yes [provider]  nitroGLYCERIN (NITROSTAT) 0.4 MG SL tablet Place 0.4 mg under the tongue every 5 (five) minutes as needed for chest pain.   Yes [provider]  omeprazole (PRILOSEC) 20 MG capsule Take 40 mg by mouth daily.   Yes [provider]  oxybutynin (DITROPAN) 5 MG tablet Take 5 mg by mouth 3 (three) times daily. 03/15/20  Yes [provider]  OXYGEN Inhale 2 L into the lungs continuous.   Yes [provider]  QUEtiapine (SEROQUEL) 300 MG tablet Take 300 mg by mouth at bedtime.   Yes [provider]  rosuvastatin (CRESTOR) 20 MG tablet Take 20 mg by mouth daily. 01/15/20  Yes [provider]  senna (SENOKOT) 8.6 MG TABS tablet Take 1 tablet by mouth 2 (two) times daily as needed for mild constipation.   Yes [provider]  tamsulosin (FLOMAX) 0.4 MG CAPS capsule Take 0.4 mg by mouth daily. 03/21/20  Yes [provider]  TRELEGY ELLIPTA 100-62.5-25 MCG/ACT AEPB Take 1 puff by mouth daily. 02/06/22  Yes [provider]  amoxicillin-clavulanate (AUGMENTIN) 875-125 MG tablet Take 1 tablet by mouth 2 (two) times daily for 3 days. Patient taking differently: Take 1 tablet by mouth See admin instructions. Bid x 3 days 03/01/22 03/04/22  Roda Shutters,  Parke Poisson, MD      Allergies    Red dye, Oxycontin [oxycodone hcl], Prednisone, Aspirin, and Tape    Review of Systems   Review of Systems  All other systems reviewed and are negative.   Physical Exam Updated Vital Signs BP 110/80   Pulse 79   Temp 98 F (36.7 C) (Oral)   Resp 18   Ht 5\' 3"  (1.6 m)   Wt 45.3 kg   SpO2 100%   BMI 17.69 kg/m  Physical Exam Vitals reviewed.  HENT:     Nose: Nose normal.     Mouth/Throat:     Mouth: Mucous membranes are moist.  Pulmonary:     Effort: Pulmonary effort is  normal.  Musculoskeletal:        General: Tenderness present.  Skin:    General: Skin is warm.  Neurological:     General: No focal deficit present.     Mental Status: She is alert.  Psychiatric:        Mood and Affect: Mood normal.     ED Results / Procedures / Treatments   Labs (all labs ordered are listed, but only abnormal results are displayed) Labs Reviewed - No data to display  EKG None  Radiology DG Forearm Right  Result Date: 03/03/2022 CLINICAL DATA:  Recent fall with right forearm pain, initial encounter EXAM: RIGHT FOREARM - 2 VIEW COMPARISON:  None Available. FINDINGS: There is no evidence of fracture or other focal bone lesions. Mild soft tissue swelling is noted distally. IMPRESSION: Soft tissue swelling distally without acute bony abnormality. Electronically Signed   By: Alcide Clever M.D.   On: 03/03/2022 02:49    Procedures Procedures    Medications Ordered in ED Medications - No data to display  ED Course/ Medical Decision Making/ A&P                           Medical Decision Making Amount and/or Complexity of Data Reviewed Radiology: ordered.  Risk Prescription drug management.   MDM: Patient has a tender forearm to palpation x-rays are obtained and and showed no fracture of right forearm patient is placed in an Ace wrap she is advised to follow-up with Dr. Osvaldo Human if pain persist past 1 week,        Final Clinical Impression(s) / ED Diagnoses Final diagnoses:  None    Rx / DC Orders ED Discharge Orders          Ordered    HYDROcodone-acetaminophen (NORCO/VICODIN) 5-325 MG tablet  Every 4 hours PRN        03/03/22 0714           An After Visit Summary was printed and given to the patient.    Elson Areas, [place] 03/03/22 1340    Margarita Grizzle, MD 03/03/22 1606

## 2022-03-03 NOTE — ED Triage Notes (Signed)
Pt was sitting outside wwhen  she stood up and fell backwards down 3 steps and hit posterior head. No lacerations to head. No LOC. Does have swelling to right forearm. Able to move fingers and pulses intact

## 2022-03-06 ENCOUNTER — Other Ambulatory Visit: Payer: Self-pay

## 2022-03-06 ENCOUNTER — Encounter (HOSPITAL_COMMUNITY): Payer: Self-pay | Admitting: Emergency Medicine

## 2022-03-06 ENCOUNTER — Emergency Department (HOSPITAL_COMMUNITY): Payer: 59

## 2022-03-06 ENCOUNTER — Emergency Department (HOSPITAL_COMMUNITY)
Admission: EM | Admit: 2022-03-06 | Discharge: 2022-03-07 | Disposition: A | Discharge status: Home / Self Care | Payer: 59 | Source: Home / Self Care | Attending: Emergency Medicine | Admitting: Emergency Medicine

## 2022-03-06 ENCOUNTER — Emergency Department (HOSPITAL_COMMUNITY)
Admission: EM | Admit: 2022-03-06 | Discharge: 2022-03-06 | Disposition: A | Discharge status: Home / Self Care | Payer: 59 | Attending: Emergency Medicine | Admitting: Emergency Medicine

## 2022-03-06 ENCOUNTER — Encounter (HOSPITAL_COMMUNITY): Payer: Self-pay

## 2022-03-06 DIAGNOSIS — J449 Chronic obstructive pulmonary disease, unspecified: Secondary | ICD-10-CM | POA: Insufficient documentation

## 2022-03-06 DIAGNOSIS — Z85118 Personal history of other malignant neoplasm of bronchus and lung: Secondary | ICD-10-CM | POA: Insufficient documentation

## 2022-03-06 DIAGNOSIS — E039 Hypothyroidism, unspecified: Secondary | ICD-10-CM | POA: Diagnosis not present

## 2022-03-06 DIAGNOSIS — J9611 Chronic respiratory failure with hypoxia: Secondary | ICD-10-CM | POA: Insufficient documentation

## 2022-03-06 DIAGNOSIS — Z79899 Other long term (current) drug therapy: Secondary | ICD-10-CM | POA: Insufficient documentation

## 2022-03-06 DIAGNOSIS — R0789 Other chest pain: Secondary | ICD-10-CM | POA: Diagnosis not present

## 2022-03-06 DIAGNOSIS — J441 Chronic obstructive pulmonary disease with (acute) exacerbation: Secondary | ICD-10-CM | POA: Insufficient documentation

## 2022-03-06 DIAGNOSIS — R0602 Shortness of breath: Secondary | ICD-10-CM | POA: Diagnosis present

## 2022-03-06 LAB — BRAIN NATRIURETIC PEPTIDE: B Natriuretic Peptide: 96.6 pg/mL (ref 0.0–100.0)

## 2022-03-06 LAB — BASIC METABOLIC PANEL
Anion gap: 10 (ref 5–15)
BUN: 14 mg/dL (ref 8–23)
CO2: 26 mmol/L (ref 22–32)
Calcium: 8.8 mg/dL — ABNORMAL LOW (ref 8.9–10.3)
Chloride: 103 mmol/L (ref 98–111)
Creatinine, Ser: 0.42 mg/dL — ABNORMAL LOW (ref 0.44–1.00)
GFR, Estimated: >60 mL/min (ref >60)
Glucose, Bld: 123 mg/dL — ABNORMAL HIGH (ref 70–99)
Potassium: 3.8 mmol/L (ref 3.5–5.1)
Sodium: 139 mmol/L (ref 135–145)

## 2022-03-06 LAB — CBC WITH DIFFERENTIAL/PLATELET
Abs Immature Granulocytes: 0.08 10*3/uL — ABNORMAL HIGH (ref 0.00–0.07)
Basophils Absolute: 0 10*3/uL (ref 0.0–0.1)
Basophils Relative: 0 %
Eosinophils Absolute: 0.1 10*3/uL (ref 0.0–0.5)
Eosinophils Relative: 1 %
HCT: 31.8 % — ABNORMAL LOW (ref 36.0–46.0)
Hemoglobin: 9.7 g/dL — ABNORMAL LOW (ref 12.0–15.0)
Immature Granulocytes: 1 %
Lymphocytes Relative: 11 %
Lymphs Abs: 1 10*3/uL (ref 0.7–4.0)
MCH: 27.7 pg (ref 26.0–34.0)
MCHC: 30.5 g/dL (ref 30.0–36.0)
MCV: 90.9 fL (ref 80.0–100.0)
Monocytes Absolute: 0.3 10*3/uL (ref 0.1–1.0)
Monocytes Relative: 3 %
Neutro Abs: 8 10*3/uL — ABNORMAL HIGH (ref 1.7–7.7)
Neutrophils Relative %: 84 %
Platelets: 336 10*3/uL (ref 150–400)
RBC: 3.5 MIL/uL — ABNORMAL LOW (ref 3.87–5.11)
RDW: 18.6 % — ABNORMAL HIGH (ref 11.5–15.5)
WBC: 9.5 10*3/uL (ref 4.0–10.5)
nRBC: 0 % (ref 0.0–0.2)

## 2022-03-06 LAB — TROPONIN I (HIGH SENSITIVITY)
Troponin I (High Sensitivity): 9 ng/L (ref <18)
Troponin I (High Sensitivity): 7 ng/L (ref <18)

## 2022-03-06 MED ORDER — DEXAMETHASONE SODIUM PHOSPHATE 10 MG/ML IJ SOLN
10.0000 mg | Freq: Once | INTRAMUSCULAR | Status: AC
  Administered 2022-03-06: 10 mg via INTRAVENOUS
  Filled 2022-03-06: qty 1

## 2022-03-06 MED ORDER — ACETAMINOPHEN 325 MG PO TABS
650.0000 mg | ORAL_TABLET | Freq: Four times a day (QID) | ORAL | Status: DC | PRN
  Administered 2022-03-06: 650 mg via ORAL
  Filled 2022-03-06: qty 2

## 2022-03-06 MED ORDER — IPRATROPIUM-ALBUTEROL 0.5-2.5 (3) MG/3ML IN SOLN
3.0000 mL | Freq: Once | RESPIRATORY_TRACT | Status: AC
  Administered 2022-03-06: 3 mL via RESPIRATORY_TRACT
  Filled 2022-03-06: qty 3

## 2022-03-06 NOTE — ED Triage Notes (Signed)
Patient arrived with EMS from home reports SOB with wheezing and dry cough this evening . No fever or chills .

## 2022-03-06 NOTE — ED Notes (Signed)
Pt in room A&O x4. Updated on plan of care for [facility] transfer home. Pt given meal bag and drink. Denies any other needs at this time.

## 2022-03-06 NOTE — ED Provider Notes (Signed)
[facility]PART[facility] Provider Note   CSN: [id number] Arrival date & time: 03/06/22  0148     History  Chief Complaint  Patient presents with   Chest Pain   Shortness of Breath    Tracy Park is a 66 y.o. female.  HPI     This is a 66 year old female who presents with chest pain and shortness of breath.  Reports recent history of the same.  She is reporting left-sided chest discomfort that is worse with deep breathing.  She states she has a chronic cough.  She is supposed to be on 2 L of oxygen continuously.  She has not had any lower extremity swelling.  She continues to smoke.  She is using her inhalers at home with minimal relief.  Chart reviewed.  Recent history of community-acquired pneumonia.  Was discharged on Augmentin.  States she is not taking any antibiotics at this time.  Home Medications Prior to Admission medications   Medication Sig Start Date End Date Taking? Authorizing Provider  acetaminophen (TYLENOL) 325 MG tablet Take 650 mg by mouth every 6 (six) hours as needed for moderate pain or headache.   Yes [provider]  busPIRone (BUSPAR) 5 MG tablet Take 5 mg by mouth 3 (three) times daily.   Yes [provider]  Cholecalciferol 25 MCG (1000 UT) tablet Take 1,000 Units by mouth daily.   Yes [provider]  famotidine (PEPCID) 40 MG tablet Take 40 mg by mouth daily. 02/14/20  Yes [provider]  ipratropium-albuterol (DUONEB) 0.5-2.5 (3) MG/3ML SOLN Take 3 mLs by nebulization every 6 (six) hours as needed (shortness of breath). 02/10/22  Yes [provider]  levothyroxine (SYNTHROID) 137 MCG tablet Take 137 mcg by mouth daily before breakfast. 03/14/20  Yes [provider]  Magnesium Oxide 400 MG CAPS Take 1 capsule (400 mg total) by mouth daily at 12 noon. Patient taking differently: Take 400 mg by mouth daily. 03/01/22  Yes Florencia Reasons, MD  meloxicam (MOBIC) 15 MG tablet Take 15 mg  by mouth daily. 04/07/20  Yes [provider]  methocarbamol (ROBAXIN) 500 MG tablet Take 500 mg by mouth daily. 02/10/20  Yes [provider]  Multiple Vitamins-Minerals (CENTRUM WOMEN) TABS Take 1 tablet by mouth daily.   Yes [provider]  nitroGLYCERIN (NITROSTAT) 0.4 MG SL tablet Place 0.4 mg under the tongue every 5 (five) minutes as needed for chest pain.   Yes [provider]  omeprazole (PRILOSEC) 20 MG capsule Take 40 mg by mouth daily.   Yes [provider]  oxybutynin (DITROPAN) 5 MG tablet Take 5 mg by mouth 3 (three) times daily. 03/15/20  Yes [provider]  OXYGEN Inhale 2 L into the lungs continuous.   Yes [provider]  QUEtiapine (SEROQUEL) 300 MG tablet Take 300 mg by mouth at bedtime.   Yes [provider]  rosuvastatin (CRESTOR) 20 MG tablet Take 20 mg by mouth daily. 01/15/20  Yes [provider]  senna (SENOKOT) 8.6 MG TABS tablet Take 1 tablet by mouth 2 (two) times daily as needed for mild constipation.   Yes [provider]  tamsulosin (FLOMAX) 0.4 MG CAPS capsule Take 0.4 mg by mouth daily. 03/21/20  Yes [provider]  TRELEGY ELLIPTA 100-62.5-25 MCG/ACT AEPB Take 1 puff by mouth daily. 02/06/22  Yes [provider]  HYDROcodone-acetaminophen (NORCO/VICODIN) 5-325 MG tablet Take 1 tablet by mouth every 4 (four) hours as needed  for moderate pain. 03/03/22 03/03/23  Fransico Meadow, PA-C      Allergies    Red dye, Oxycontin [oxycodone hcl], Prednisone, Aspirin, and Tape    Review of Systems   Review of Systems  Constitutional:  Negative for fever.  Respiratory:  Positive for cough and shortness of breath.   Cardiovascular:  Positive for chest pain.  All other systems reviewed and are negative.   Physical Exam Updated Vital Signs BP 134/86   Pulse 86   Temp 98.3 F (36.8 C) (Oral)   Resp 19   SpO2 100%  Physical Exam Vitals and nursing note reviewed.   Constitutional:      Comments: Chronically ill-appearing but nontoxic  HENT:     Head: Normocephalic and atraumatic.  Eyes:     Pupils: Pupils are equal, round, and reactive to light.  Cardiovascular:     Rate and Rhythm: Normal rate and regular rhythm.     Heart sounds: Normal heart sounds.  Pulmonary:     Effort: Pulmonary effort is normal. No respiratory distress.     Breath sounds: Wheezing present.     Comments: Wheezing in all lung fields Abdominal:     General: Bowel sounds are normal.     Palpations: Abdomen is soft.  Musculoskeletal:     Cervical back: Neck supple.     Right lower leg: No edema.     Left lower leg: No edema.  Skin:    General: Skin is warm and dry.  Neurological:     Mental Status: She is alert and oriented to person, place, and time.  Psychiatric:        Mood and Affect: Mood normal.     ED Results / Procedures / Treatments   Labs (all labs ordered are listed, but only abnormal results are displayed) Labs Reviewed  CBC WITH DIFFERENTIAL/PLATELET - Abnormal; Notable for the following components:      Result Value   RBC 3.50 (*)    Hemoglobin 9.7 (*)    HCT 31.8 (*)    RDW 18.6 (*)    Neutro Abs 8.0 (*)    Abs Immature Granulocytes 0.08 (*)    All other components within normal limits  BASIC METABOLIC PANEL - Abnormal; Notable for the following components:   Glucose, Bld 123 (*)    Creatinine, Ser 0.42 (*)    Calcium 8.8 (*)    All other components within normal limits  BRAIN NATRIURETIC PEPTIDE  TROPONIN I (HIGH SENSITIVITY)  TROPONIN I (HIGH SENSITIVITY)    EKG None  Radiology DG Chest Portable 1 View  Result Date: 03/06/2022 CLINICAL DATA:  Shortness of breath and chest pain. EXAM: PORTABLE CHEST 1 VIEW COMPARISON:  Recent portable chest 02/28/2022, chest CT with contrast 02/25/2022 FINDINGS: Left-sided volume loss and upper lobectomy are again noted with persistent pleural-parenchymal consolidation in the apical left lung. The  appearance is not significantly changed. The rest of the lungs clear with COPD.  The cardiac size is normal. No pneumothorax is seen with stable mediastinal configuration. No acute osseous abnormality. IMPRESSION: Persistent left apical pleural-parenchymal consolidation with coarse interstitial change superimposed on remote left upper lobectomy. Additional scattered opacities noted in the left base on the last study are not seen today. Electronically Signed   By: Telford Nab M.D.   On: 03/06/2022 02:29    Procedures Procedures    Medications Ordered in ED Medications  acetaminophen (TYLENOL) tablet 650 mg (has no administration in time range)  ipratropium-albuterol (  DUONEB) 0.5-2.5 (3) MG/3ML nebulizer solution 3 mL (3 mLs Nebulization Given 03/06/22 0222)  dexamethasone (DECADRON) injection 10 mg (10 mg Intravenous Given 03/06/22 0221)    ED Course/ Medical Decision Making/ A&P                           Medical Decision Making Amount and/or Complexity of Data Reviewed Labs: ordered. Radiology: ordered.  Risk OTC drugs. Prescription drug management.   This patient presents to the ED for concern of chest pain, shortness of breath, this involves an extensive number of treatment options, and is a complaint that carries with it a high risk of complications and morbidity.  I considered the following differential and admission for this acute, potentially life threatening condition.  The differential diagnosis includes chronic respiratory failure, pneumonia, pneumothorax, ACS  MDM:    This is a 66 year old female with history of COPD and recent history of admission for pneumonia who presents with chest discomfort and shortness of breath.  She is on chronic oxygen.  She is nontoxic-appearing but chronically ill-appearing.  She has some wheezing on exam.  She was given a DuoNeb and Decadron.  She does not tolerate prednisone.  She was supposed to finish course of Augmentin but it is unclear  whether she ever finished this course.  She is afebrile.  Labs obtained.  Labs are largely reassuring including 2 troponins which are negative.  EKG was without acute ischemic changes.  Chest x-ray shows persistent left apical changes but improved scattered opacities.  Patient improved after a DuoNeb.  She had a CT scan on 6/19 to rule out PE.  Doubt PE at this time.  Encouraged the patient to use her DuoNebs at home and Tylenol as needed for pain.  (Labs, imaging, consults)  Labs: I Ordered, and personally interpreted labs.  The pertinent results include: CBC, BMP, troponin  Imaging Studies ordered: I ordered imaging studies including chest x-ray I independently visualized and interpreted imaging. I agree with the radiologist interpretation  Additional history obtained from chart review.  External records from outside source obtained and reviewed including recent admission and discharge summary  Cardiac Monitoring: The patient was maintained on a cardiac monitor.  I personally viewed and interpreted the cardiac monitored which showed an underlying rhythm of: Normal sinus rhythm  Reevaluation: After the interventions noted above, I reevaluated the patient and found that they have :improved  Social Determinants of Health: Lives independently, smoker  Disposition: Discharge  Co morbidities that complicate the patient evaluation  Past Medical History:  Diagnosis Date   Anginal pain ([place])    Anxiety    Bipolar disorder ([place])    Cancer ([place])    COPD (chronic obstructive pulmonary disease) ([place])    Dyspnea    Family history of adverse reaction to anesthesia    History of kidney stones    Hydroureteronephrosis 08/16/2021   Hypothyroidism    Lung cancer ([place])    Myocardial infarction ([place])    PTSD (post-traumatic stress disorder)    Sleep apnea    Thyroid disease      Medicines Meds ordered this encounter  Medications   ipratropium-albuterol (DUONEB) 0.5-2.5 (3) MG/3ML  nebulizer solution 3 mL   dexamethasone (DECADRON) injection 10 mg   acetaminophen (TYLENOL) tablet 650 mg    I have reviewed the patients home medicines and have made adjustments as needed  Problem List / ED Course: Problem List Items Addressed This Visit  Respiratory   Chronic respiratory failure with hypoxia ([place])   Other Visit Diagnoses     Atypical chest pain    -  Primary                   Final Clinical Impression(s) / ED Diagnoses Final diagnoses:  Atypical chest pain  Chronic respiratory failure with hypoxia [facility])    Rx / DC Orders ED Discharge Orders     None         Merryl Hacker, MD 03/06/22 [phone]

## 2022-03-06 NOTE — Discharge Instructions (Signed)
You were seen today for chest pain and shortness of breath.  This is likely a combination of your recent pneumonia and chronic respiratory failure.  Continue your oxygen at home.  Take Tylenol as needed for pain.  You were given 1 dose of Decadron.  You are to finish a course of Augmentin from 6/23 to 6/26.  Given that your chest x-ray at this time is improved, you will not need an additional antibiotic.  You do need to follow-up for repeat CT scan to ensure that the changes noted on your prior CT have resolved.  Follow-up with your primary physician for this.

## 2022-03-07 ENCOUNTER — Emergency Department (HOSPITAL_COMMUNITY): Payer: 59

## 2022-03-07 DIAGNOSIS — J441 Chronic obstructive pulmonary disease with (acute) exacerbation: Secondary | ICD-10-CM | POA: Diagnosis not present

## 2022-03-07 LAB — CBC WITH DIFFERENTIAL/PLATELET
Abs Immature Granulocytes: 0.07 10*3/uL (ref 0.00–0.07)
Basophils Absolute: 0 10*3/uL (ref 0.0–0.1)
Basophils Relative: 0 %
Eosinophils Absolute: 0 10*3/uL (ref 0.0–0.5)
Eosinophils Relative: 0 %
HCT: 33.3 % — ABNORMAL LOW (ref 36.0–46.0)
Hemoglobin: 10.3 g/dL — ABNORMAL LOW (ref 12.0–15.0)
Immature Granulocytes: 1 %
Lymphocytes Relative: 29 %
Lymphs Abs: 2.7 10*3/uL (ref 0.7–4.0)
MCH: 27.8 pg (ref 26.0–34.0)
MCHC: 30.9 g/dL (ref 30.0–36.0)
MCV: 89.8 fL (ref 80.0–100.0)
Monocytes Absolute: 0.7 10*3/uL (ref 0.1–1.0)
Monocytes Relative: 8 %
Neutro Abs: 5.7 10*3/uL (ref 1.7–7.7)
Neutrophils Relative %: 62 %
Platelets: 425 10*3/uL — ABNORMAL HIGH (ref 150–400)
RBC: 3.71 MIL/uL — ABNORMAL LOW (ref 3.87–5.11)
RDW: 18.3 % — ABNORMAL HIGH (ref 11.5–15.5)
WBC: 9.2 10*3/uL (ref 4.0–10.5)
nRBC: 0 % (ref 0.0–0.2)

## 2022-03-07 LAB — BASIC METABOLIC PANEL
Anion gap: 10 (ref 5–15)
BUN: 18 mg/dL (ref 8–23)
CO2: 28 mmol/L (ref 22–32)
Calcium: 9.3 mg/dL (ref 8.9–10.3)
Chloride: 104 mmol/L (ref 98–111)
Creatinine, Ser: 0.58 mg/dL (ref 0.44–1.00)
GFR, Estimated: >60 mL/min (ref >60)
Glucose, Bld: 118 mg/dL — ABNORMAL HIGH (ref 70–99)
Potassium: 3.4 mmol/L — ABNORMAL LOW (ref 3.5–5.1)
Sodium: 142 mmol/L (ref 135–145)

## 2022-03-07 MED ORDER — AZITHROMYCIN 250 MG PO TABS: 250.0000 mg | ORAL_TABLET | Freq: Every day | ORAL | 0 refills | Status: DC

## 2022-03-07 MED ORDER — CEFDINIR 300 MG PO CAPS: 300.0000 mg | ORAL_CAPSULE | Freq: Two times a day (BID) | ORAL | 0 refills | Status: DC

## 2022-03-07 MED ORDER — DEXAMETHASONE SODIUM PHOSPHATE 10 MG/ML IJ SOLN
10.0000 mg | Freq: Once | INTRAMUSCULAR | Status: AC
  Administered 2022-03-07: 10 mg via INTRAMUSCULAR
  Filled 2022-03-07: qty 1

## 2022-03-07 MED ORDER — CEFDINIR 300 MG PO CAPS
300.0000 mg | ORAL_CAPSULE | Freq: Once | ORAL | Status: AC
Start: 2022-03-07 — End: 2022-03-07
  Administered 2022-03-07: 300 mg via ORAL
  Filled 2022-03-07: qty 1

## 2022-03-07 MED ORDER — AZITHROMYCIN 250 MG PO TABS
500.0000 mg | ORAL_TABLET | Freq: Once | ORAL | Status: AC
  Administered 2022-03-07: 500 mg via ORAL
  Filled 2022-03-07: qty 2

## 2022-03-07 NOTE — ED Notes (Signed)
[facility] WAS CALLED FOR PT NO ETA GIVEN

## 2022-03-07 NOTE — ED Provider Notes (Signed)
[facility] EMERGENCY DEPARTMENT Provider Note   CSN: [id number] Arrival date & time: 03/06/22  2332     History  Chief Complaint  Patient presents with   Shortness of Breath    [place] is a 66 y.o. female.  Patient presents to the emergency department with persistent shortness of breath.  Patient reports that she has been in the ED a couple of times and had a recent admission for this.  Patient has severe lung disease, is chronically on O2 24 hours a day.  She has a history of lung cancer and has had partial lung resection.  Patient reports that it still feels like something is wrong with her left lung.       Home Medications Prior to Admission medications   Medication Sig Start Date End Date Taking? Authorizing Provider  acetaminophen (TYLENOL) 325 MG tablet Take 650 mg by mouth every 6 (six) hours as needed for moderate pain or headache.    [provider]  busPIRone (BUSPAR) 5 MG tablet Take 5 mg by mouth 3 (three) times daily.    [provider]  Cholecalciferol 25 MCG (1000 UT) tablet Take 1,000 Units by mouth daily.    [provider]  famotidine (PEPCID) 40 MG tablet Take 40 mg by mouth daily. 02/14/20   [provider]  HYDROcodone-acetaminophen (NORCO/VICODIN) 5-325 MG tablet Take 1 tablet by mouth every 4 (four) hours as needed for moderate pain. 03/03/22 03/03/23  Fransico Meadow, PA-C  ipratropium-albuterol (DUONEB) 0.5-2.5 (3) MG/3ML SOLN Take 3 mLs by nebulization every 6 (six) hours as needed (shortness of breath). 02/10/22   [provider]  levothyroxine (SYNTHROID) 137 MCG tablet Take 137 mcg by mouth daily before breakfast. 03/14/20   [provider]  Magnesium Oxide 400 MG CAPS Take 1 capsule (400 mg total) by mouth daily at 12 noon. Patient taking differently: Take 400 mg by mouth daily. 03/01/22   Florencia Reasons, MD  meloxicam (MOBIC) 15 MG tablet Take 15 mg by mouth daily. 04/07/20   [provider]  methocarbamol (ROBAXIN) 500 MG tablet Take 500 mg by mouth daily. 02/10/20   [provider]  Multiple Vitamins-Minerals (CENTRUM WOMEN) TABS Take 1 tablet by mouth daily.    [provider]  nitroGLYCERIN (NITROSTAT) 0.4 MG SL tablet Place 0.4 mg under the tongue every 5 (five) minutes as needed for chest pain.    [provider]  omeprazole (PRILOSEC) 20 MG capsule Take 40 mg by mouth daily.    [provider]  oxybutynin (DITROPAN) 5 MG tablet Take 5 mg by mouth 3 (three) times daily. 03/15/20   [provider]  OXYGEN Inhale 2 L into the lungs continuous.    [provider]  QUEtiapine (SEROQUEL) 300 MG tablet Take 300 mg by mouth at bedtime.    [provider]  rosuvastatin (CRESTOR) 20 MG tablet Take 20 mg by mouth daily. 01/15/20   [provider]  senna (SENOKOT) 8.6 MG TABS tablet Take 1 tablet by mouth 2 (two) times daily as needed for mild constipation.    [provider]  tamsulosin (FLOMAX) 0.4 MG CAPS capsule Take 0.4 mg by mouth daily. 03/21/20   [provider]  TRELEGY ELLIPTA 100-62.5-25 MCG/ACT AEPB Take 1 puff by mouth daily. 02/06/22   [provider]      Allergies    Red dye, Oxycontin [oxycodone hcl], Prednisone, Aspirin, and Tape    Review of  Systems   Review of Systems  Physical Exam Updated Vital Signs BP 128/83   Pulse 84   Temp 98.4 F (36.9 C)   Resp (!) 22   SpO2 100%  Physical Exam Vitals and nursing note reviewed.  Constitutional:      General: She is not in acute distress.    Appearance: She is well-developed.  HENT:     Head: Normocephalic and atraumatic.     Mouth/Throat:     Mouth: Mucous membranes are moist.  Eyes:     General: Vision grossly intact. Gaze aligned appropriately.     Extraocular Movements: Extraocular movements intact.     Conjunctiva/sclera: Conjunctivae normal.  Cardiovascular:     Rate and Rhythm: Normal rate and  regular rhythm.     Pulses: Normal pulses.     Heart sounds: Normal heart sounds, S1 normal and S2 normal. No murmur heard.    No friction rub. No gallop.  Pulmonary:     Effort: Pulmonary effort is normal. No respiratory distress.     Breath sounds: Examination of the left-upper field reveals decreased breath sounds, wheezing and rhonchi. Decreased breath sounds, wheezing and rhonchi present.  Abdominal:     General: Bowel sounds are normal.     Palpations: Abdomen is soft.     Tenderness: There is no abdominal tenderness. There is no guarding or rebound.     Hernia: No hernia is present.  Musculoskeletal:        General: No swelling. Normal range of motion.     Cervical back: Full passive range of motion without pain, normal range of motion and neck supple. No spinous process tenderness or muscular tenderness. Normal range of motion.     Right lower leg: No edema.     Left lower leg: No edema.  Skin:    General: Skin is warm and dry.     Capillary Refill: Capillary refill takes less than 2 seconds.     Findings: No ecchymosis, erythema, rash or wound.  Neurological:     General: No focal deficit present.     Mental Status: She is alert and oriented to person, place, and time.     GCS: GCS eye subscore is 4. GCS verbal subscore is 5. GCS motor subscore is 6.     Cranial Nerves: Cranial nerves 2-12 are intact.     Sensory: Sensation is intact.     Motor: Motor function is intact.     Coordination: Coordination is intact.  Psychiatric:        Attention and Perception: Attention normal.        Mood and Affect: Mood normal.        Speech: Speech normal.        Behavior: Behavior normal.     ED Results / Procedures / Treatments   Labs (all labs ordered are listed, but only abnormal results are displayed) Labs Reviewed  CBC WITH DIFFERENTIAL/PLATELET - Abnormal; Notable for the following components:      Result Value   RBC 3.71 (*)    Hemoglobin 10.3 (*)    HCT 33.3 (*)     RDW 18.3 (*)    Platelets 425 (*)    All other components within normal limits  BASIC METABOLIC PANEL - Abnormal; Notable for the following components:   Potassium 3.4 (*)    Glucose, Bld 118 (*)    All other components within normal limits    EKG None  Radiology DG Chest 2  View  Result Date: 03/07/2022 CLINICAL DATA:  Shortness of breath EXAM: CHEST - 2 VIEW COMPARISON:  03/06/2022.  CT 02/25/2022 FINDINGS: Postoperative changes on the left with volume loss. Persistent density in the left upper lung/apex, unchanged since prior study. Hyperinflation. Right lung clear. Heart is normal size. No effusions or acute bony abnormality. IMPRESSION: Postoperative changes on the left with stable chronic density in the left upper lung. No acute findings. Electronically Signed   By: Rolm Baptise M.D.   On: 03/07/2022 00:18   DG Chest Portable 1 View  Result Date: 03/06/2022 CLINICAL DATA:  Shortness of breath and chest pain. EXAM: PORTABLE CHEST 1 VIEW COMPARISON:  Recent portable chest 02/28/2022, chest CT with contrast 02/25/2022 FINDINGS: Left-sided volume loss and upper lobectomy are again noted with persistent pleural-parenchymal consolidation in the apical left lung. The appearance is not significantly changed. The rest of the lungs clear with COPD.  The cardiac size is normal. No pneumothorax is seen with stable mediastinal configuration. No acute osseous abnormality. IMPRESSION: Persistent left apical pleural-parenchymal consolidation with coarse interstitial change superimposed on remote left upper lobectomy. Additional scattered opacities noted in the left base on the last study are not seen today. Electronically Signed   By: Telford Nab M.D.   On: 03/06/2022 02:29    Procedures Procedures    Medications Ordered in ED Medications - No data to display  ED Course/ Medical Decision Making/ A&P                           Medical Decision Making Amount and/or Complexity of Data  Reviewed Labs: ordered. Radiology: ordered.   Patient presents to the emergency department for evaluation of shortness of breath and nonproductive cough.  Differential diagnosis includes COPD exacerbation, bronchitis, pneumonia.  Chest x-ray does not show obvious pneumonia.  I did review her most recent records, however, and she did have air bronchograms seen on CT angiography prompting her hospitalization 10 days ago.  She was to be discharged with Augmentin but did not get a prescription filled.  It is therefore reasonable that she may not have had full treatment of her recent pneumonia which was only seen on CT.  Patient appears comfortable.  No respiratory distress or tachypnea.  Oxygen saturations are adequate on her normal supplemental oxygen.  No evidence of volume overload.  She did have a PE study 10 days ago for similar symptoms that was negative.  She does not require repeat CT imaging.  Will discharge with antibiotics.  She does not tolerate oral steroids which would likely be of benefit to her.  We will give an additional dose of Decadron prior to discharge.        Final Clinical Impression(s) / ED Diagnoses Final diagnoses:  COPD exacerbation [facility])    Rx / DC Orders ED Discharge Orders     None         Okla Qazi, Gwenyth Allegra, MD 03/07/22 [phone]

## 2022-03-11 ENCOUNTER — Other Ambulatory Visit: Payer: Self-pay

## 2022-03-11 ENCOUNTER — Emergency Department (HOSPITAL_COMMUNITY)
Admission: EM | Admit: 2022-03-11 | Discharge: 2022-03-12 | Disposition: A | Discharge status: Home / Self Care | Payer: 59 | Source: Home / Self Care | Attending: Emergency Medicine | Admitting: Emergency Medicine

## 2022-03-11 ENCOUNTER — Encounter (HOSPITAL_COMMUNITY): Payer: Self-pay

## 2022-03-11 ENCOUNTER — Emergency Department (HOSPITAL_COMMUNITY): Payer: 59

## 2022-03-11 DIAGNOSIS — A419 Sepsis, unspecified organism: Secondary | ICD-10-CM | POA: Diagnosis not present

## 2022-03-11 DIAGNOSIS — R0602 Shortness of breath: Secondary | ICD-10-CM | POA: Insufficient documentation

## 2022-03-11 DIAGNOSIS — M533 Sacrococcygeal disorders, not elsewhere classified: Secondary | ICD-10-CM | POA: Insufficient documentation

## 2022-03-11 DIAGNOSIS — R109 Unspecified abdominal pain: Secondary | ICD-10-CM

## 2022-03-11 LAB — BASIC METABOLIC PANEL
Anion gap: 8 (ref 5–15)
BUN: 16 mg/dL (ref 8–23)
CO2: 29 mmol/L (ref 22–32)
Calcium: 9.2 mg/dL (ref 8.9–10.3)
Chloride: 105 mmol/L (ref 98–111)
Creatinine, Ser: 0.56 mg/dL (ref 0.44–1.00)
GFR, Estimated: >60 mL/min (ref >60)
Glucose, Bld: 116 mg/dL — ABNORMAL HIGH (ref 70–99)
Potassium: 4.2 mmol/L (ref 3.5–5.1)
Sodium: 142 mmol/L (ref 135–145)

## 2022-03-11 LAB — URINALYSIS, ROUTINE W REFLEX MICROSCOPIC
Bilirubin Urine: NEGATIVE
Glucose, UA: NEGATIVE mg/dL
Hgb urine dipstick: NEGATIVE
Ketones, ur: NEGATIVE mg/dL
Leukocytes,Ua: NEGATIVE
Nitrite: NEGATIVE
Protein, ur: NEGATIVE mg/dL
Specific Gravity, Urine: 1.006 (ref 1.005–1.030)
pH: 8 (ref 5.0–8.0)

## 2022-03-11 LAB — CBC
HCT: 34.9 % — ABNORMAL LOW (ref 36.0–46.0)
Hemoglobin: 10.9 g/dL — ABNORMAL LOW (ref 12.0–15.0)
MCH: 28.4 pg (ref 26.0–34.0)
MCHC: 31.2 g/dL (ref 30.0–36.0)
MCV: 90.9 fL (ref 80.0–100.0)
Platelets: 524 10*3/uL — ABNORMAL HIGH (ref 150–400)
RBC: 3.84 MIL/uL — ABNORMAL LOW (ref 3.87–5.11)
RDW: 18.7 % — ABNORMAL HIGH (ref 11.5–15.5)
WBC: 16 10*3/uL — ABNORMAL HIGH (ref 4.0–10.5)
nRBC: 0 % (ref 0.0–0.2)

## 2022-03-11 MED ORDER — LIDOCAINE 5 % EX PTCH: 1.0000 | MEDICATED_PATCH | CUTANEOUS | 0 refills | Status: DC

## 2022-03-11 MED ORDER — DIAZEPAM 2 MG PO TABS: 2.0000 mg | ORAL_TABLET | Freq: Four times a day (QID) | ORAL | 0 refills | Status: DC | PRN

## 2022-03-11 MED ORDER — LORAZEPAM 1 MG PO TABS
1.0000 mg | ORAL_TABLET | Freq: Once | ORAL | Status: AC
  Administered 2022-03-11: 1 mg via ORAL
  Filled 2022-03-11: qty 1

## 2022-03-11 NOTE — ED Triage Notes (Addendum)
Per EMS- Patient c/o left flank pain and states that it is worse when she takes a deep breath. Patient also reports SOB when ambulating.    In triage, patient states that she is having left lower back pain that radiates down the leg to the calf x 2-3 days. Patient is very drowsy in triage.

## 2022-03-11 NOTE — ED Provider Triage Note (Cosign Needed Addendum)
Emergency Medicine Provider Triage Evaluation Note  Tracy Park , a 66 y.o. female  was evaluated in triage.  Pt complains of left-sided back pain that radiates down into her left leg.  Patient states this pain began 2 days ago.  Patient states pain begins on the left side of her thoracic spine, travels down to the left side of her low back and then into her left leg.  The patient denies any history of low back pain, sciatica.  Patient denies history of kidney stones however on chart review it appears that the patient had a kidney stone requiring stent in 2022.  Patient denies any fevers, nausea or vomiting, decreased urine stream, dysuria, blood in urine.  Patient denies any recent heavy lifting.  Patient states she wears 2 L of oxygen at home continuously however she is without oxygen here today.  Patient oxygen saturation is at 95% on room air.  Review of Systems  Positive:  Negative:   Physical Exam  BP 125/87 (BP Location: Right Arm)   Pulse (!) 103   Temp 98.6 F (37 C) (Oral)   Resp 16   Ht 5\' 3"  (1.6 m)   Wt 45.4 kg   SpO2 96%   BMI 17.71 kg/m  Gen:   Awake, no distress   Resp:  Normal effort  MSK:   Moves extremities without difficulty  Other:    Medical Decision Making  Medically screening exam initiated at 5:23 PM.  Appropriate orders placed.  Tracy Park was informed that the remainder of the evaluation will be completed by another provider, this initial triage assessment does not replace that evaluation, and the importance of remaining in the ED until their evaluation is complete.         Azucena Cecil, PA-C 03/11/22 1726

## 2022-03-11 NOTE — ED Provider Notes (Signed)
[facility] DEPT Provider Note   CSN: [id number] Arrival date & time: 03/11/22  1600     History  Chief Complaint  Patient presents with   Flank Pain   Shortness of Breath    Tracy Park is a 66 y.o. female.  65 year old female presents with left-sided SI joint pain times several days.  States the pain has been atraumatic.  It is worse when she makes certain movements and when she walks.  Denies any trauma.  Does radiate down her leg to her foot.  Denies any bowel or bladder dysfunction.  Symptoms better with remaining still.  No treatment use prior to arrival       Home Medications Prior to Admission medications   Medication Sig Start Date End Date Taking? Authorizing Provider  acetaminophen (TYLENOL) 325 MG tablet Take 650 mg by mouth every 6 (six) hours as needed for moderate pain or headache.    [provider]  azithromycin (ZITHROMAX) 250 MG tablet Take 1 tablet (250 mg total) by mouth daily. 03/08/22   Orpah Greek, MD  busPIRone (BUSPAR) 5 MG tablet Take 5 mg by mouth 3 (three) times daily.    [provider]  cefdinir (OMNICEF) 300 MG capsule Take 1 capsule (300 mg total) by mouth 2 (two) times daily. 03/07/22   Orpah Greek, MD  Cholecalciferol 25 MCG (1000 UT) tablet Take 1,000 Units by mouth daily.    [provider]  famotidine (PEPCID) 40 MG tablet Take 40 mg by mouth daily. 02/14/20   [provider]  HYDROcodone-acetaminophen (NORCO/VICODIN) 5-325 MG tablet Take 1 tablet by mouth every 4 (four) hours as needed for moderate pain. 03/03/22 03/03/23  Fransico Meadow, PA-C  ipratropium-albuterol (DUONEB) 0.5-2.5 (3) MG/3ML SOLN Take 3 mLs by nebulization every 6 (six) hours as needed (shortness of breath). 02/10/22   [provider]  levothyroxine (SYNTHROID) 137 MCG tablet Take 137 mcg by mouth daily before breakfast. 03/14/20   [provider]  Magnesium Oxide 400 MG  CAPS Take 1 capsule (400 mg total) by mouth daily at 12 noon. Patient taking differently: Take 400 mg by mouth daily. 03/01/22   Florencia Reasons, MD  meloxicam (MOBIC) 15 MG tablet Take 15 mg by mouth daily. 04/07/20   [provider]  methocarbamol (ROBAXIN) 500 MG tablet Take 500 mg by mouth daily. 02/10/20   [provider]  Multiple Vitamins-Minerals (CENTRUM WOMEN) TABS Take 1 tablet by mouth daily.    [provider]  nitroGLYCERIN (NITROSTAT) 0.4 MG SL tablet Place 0.4 mg under the tongue every 5 (five) minutes as needed for chest pain.    [provider]  omeprazole (PRILOSEC) 20 MG capsule Take 40 mg by mouth daily.    [provider]  oxybutynin (DITROPAN) 5 MG tablet Take 5 mg by mouth 3 (three) times daily. 03/15/20   [provider]  OXYGEN Inhale 2 L into the lungs continuous.    [provider]  QUEtiapine (SEROQUEL) 300 MG tablet Take 300 mg by mouth at bedtime.    [provider]  rosuvastatin (CRESTOR) 20 MG tablet Take 20 mg by mouth daily. 01/15/20   [provider]  senna (SENOKOT) 8.6 MG TABS tablet Take 1 tablet by mouth 2 (two) times daily as needed for mild constipation.    [provider]  tamsulosin (FLOMAX) 0.4 MG CAPS capsule Take 0.4 mg by mouth daily. 03/21/20   [provider]  Viviana Simpler  ELLIPTA 100-62.5-25 MCG/ACT AEPB Take 1 puff by mouth daily. 02/06/22   [provider]      Allergies    Red dye, Oxycontin [oxycodone hcl], Prednisone, Aspirin, and Tape    Review of Systems   Review of Systems  All other systems reviewed and are negative.   Physical Exam Updated Vital Signs BP 136/83 (BP Location: Left Arm)   Pulse 90   Temp 98.6 F (37 C) (Oral)   Resp 18   Ht 1.6 m (5\' 3" )   Wt 45.4 kg   SpO2 95%   BMI 17.71 kg/m  Physical Exam Vitals and nursing note reviewed.  Constitutional:      General: She is not in acute distress.    Appearance: Normal  appearance. She is well-developed. She is not toxic-appearing.  HENT:     Head: Normocephalic and atraumatic.  Eyes:     General: Lids are normal.     Conjunctiva/sclera: Conjunctivae normal.     Pupils: Pupils are equal, round, and reactive to light.  Neck:     Thyroid: No thyroid mass.     Trachea: No tracheal deviation.  Cardiovascular:     Rate and Rhythm: Normal rate and regular rhythm.     Heart sounds: Normal heart sounds. No murmur heard.    No gallop.  Pulmonary:     Effort: Pulmonary effort is normal. No respiratory distress.     Breath sounds: Normal breath sounds. No stridor. No decreased breath sounds, wheezing, rhonchi or rales.  Abdominal:     General: There is no distension.     Palpations: Abdomen is soft.     Tenderness: There is no abdominal tenderness. There is no rebound.  Musculoskeletal:        General: No tenderness. Normal range of motion.     Cervical back: Normal range of motion and neck supple.       Back:  Skin:    General: Skin is warm and dry.     Findings: No abrasion or rash.  Neurological:     General: No focal deficit present.     Mental Status: She is alert and oriented to person, place, and time. Mental status is at baseline.     GCS: GCS eye subscore is 4. GCS verbal subscore is 5. GCS motor subscore is 6.     Cranial Nerves: No cranial nerve deficit.     Sensory: No sensory deficit.     Motor: Motor function is intact.     Gait: Gait is intact.  Psychiatric:        Attention and Perception: Attention normal.        Speech: Speech normal.        Behavior: Behavior normal.     ED Results / Procedures / Treatments   Labs (all labs ordered are listed, but only abnormal results are displayed) Labs Reviewed  CBC - Abnormal; Notable for the following components:      Result Value   WBC 16.0 (*)    RBC 3.84 (*)    Hemoglobin 10.9 (*)    HCT 34.9 (*)    RDW 18.7 (*)    Platelets 524 (*)    All other components within normal limits   BASIC METABOLIC PANEL - Abnormal; Notable for the following components:   Glucose, Bld 116 (*)    All other components within normal limits  URINALYSIS, ROUTINE W REFLEX MICROSCOPIC - Abnormal; Notable for the following components:   Color,  Urine STRAW (*)    All other components within normal limits    EKG None  Radiology No results found.  Procedures Procedures    Medications Ordered in ED Medications - No data to display  ED Course/ Medical Decision Making/ A&P                           Medical Decision Making Amount and/or Complexity of Data Reviewed Radiology: ordered. ECG/medicine tests: ordered.  Risk Prescription drug management.   Patient presented with pain to her flank.  She has reproducible tenderness at her left SI joint.  Her LS spine series per my review is negative here.  Has some vague complaints of feeling short of breath however no indication of cardiopulmonary involvement here.  Flank pain considered urological etiology but urinalysis negative.  Patient became anxious given Ativan feels better.  Will discharge home and she can follow-up with her doctor        Final Clinical Impression(s) / ED Diagnoses Final diagnoses:  None    Rx / DC Orders ED Discharge Orders     None         Lacretia Leigh, MD 03/11/22 2300

## 2022-03-13 ENCOUNTER — Encounter (HOSPITAL_COMMUNITY): Payer: Self-pay

## 2022-03-13 ENCOUNTER — Emergency Department (HOSPITAL_COMMUNITY): Payer: 59

## 2022-03-13 ENCOUNTER — Inpatient Hospital Stay (HOSPITAL_COMMUNITY)
Admission: EM | Admit: 2022-03-13 | Discharge: 2022-03-16 | DRG: 871 | Disposition: A | Discharge status: Home / Self Care | Payer: 59 | Attending: Family Medicine | Admitting: Family Medicine

## 2022-03-13 ENCOUNTER — Other Ambulatory Visit: Payer: Self-pay

## 2022-03-13 DIAGNOSIS — Z886 Allergy status to analgesic agent status: Secondary | ICD-10-CM

## 2022-03-13 DIAGNOSIS — A419 Sepsis, unspecified organism: Secondary | ICD-10-CM | POA: Diagnosis present

## 2022-03-13 DIAGNOSIS — J9601 Acute respiratory failure with hypoxia: Secondary | ICD-10-CM | POA: Diagnosis not present

## 2022-03-13 DIAGNOSIS — J189 Pneumonia, unspecified organism: Secondary | ICD-10-CM | POA: Diagnosis present

## 2022-03-13 DIAGNOSIS — F419 Anxiety disorder, unspecified: Secondary | ICD-10-CM | POA: Diagnosis not present

## 2022-03-13 DIAGNOSIS — M533 Sacrococcygeal disorders, not elsewhere classified: Secondary | ICD-10-CM | POA: Diagnosis present

## 2022-03-13 DIAGNOSIS — D649 Anemia, unspecified: Secondary | ICD-10-CM | POA: Diagnosis present

## 2022-03-13 DIAGNOSIS — Z902 Acquired absence of lung [part of]: Secondary | ICD-10-CM

## 2022-03-13 DIAGNOSIS — G4730 Sleep apnea, unspecified: Secondary | ICD-10-CM | POA: Diagnosis present

## 2022-03-13 DIAGNOSIS — Z9102 Food additives allergy status: Secondary | ICD-10-CM

## 2022-03-13 DIAGNOSIS — Z85118 Personal history of other malignant neoplasm of bronchus and lung: Secondary | ICD-10-CM

## 2022-03-13 DIAGNOSIS — E785 Hyperlipidemia, unspecified: Secondary | ICD-10-CM

## 2022-03-13 DIAGNOSIS — R3915 Urgency of urination: Secondary | ICD-10-CM | POA: Diagnosis present

## 2022-03-13 DIAGNOSIS — F319 Bipolar disorder, unspecified: Secondary | ICD-10-CM | POA: Diagnosis present

## 2022-03-13 DIAGNOSIS — G4733 Obstructive sleep apnea (adult) (pediatric): Secondary | ICD-10-CM | POA: Diagnosis present

## 2022-03-13 DIAGNOSIS — F32A Depression, unspecified: Secondary | ICD-10-CM

## 2022-03-13 DIAGNOSIS — Z91048 Other nonmedicinal substance allergy status: Secondary | ICD-10-CM

## 2022-03-13 DIAGNOSIS — J9621 Acute and chronic respiratory failure with hypoxia: Secondary | ICD-10-CM | POA: Diagnosis present

## 2022-03-13 DIAGNOSIS — Z720 Tobacco use: Secondary | ICD-10-CM | POA: Diagnosis not present

## 2022-03-13 DIAGNOSIS — Z20822 Contact with and (suspected) exposure to covid-19: Secondary | ICD-10-CM | POA: Diagnosis present

## 2022-03-13 DIAGNOSIS — R6521 Severe sepsis with septic shock: Secondary | ICD-10-CM | POA: Diagnosis present

## 2022-03-13 DIAGNOSIS — Z9981 Dependence on supplemental oxygen: Secondary | ICD-10-CM

## 2022-03-13 DIAGNOSIS — F4310 Post-traumatic stress disorder, unspecified: Secondary | ICD-10-CM | POA: Diagnosis present

## 2022-03-13 DIAGNOSIS — F17210 Nicotine dependence, cigarettes, uncomplicated: Secondary | ICD-10-CM | POA: Diagnosis present

## 2022-03-13 DIAGNOSIS — E890 Postprocedural hypothyroidism: Secondary | ICD-10-CM | POA: Diagnosis present

## 2022-03-13 DIAGNOSIS — R350 Frequency of micturition: Secondary | ICD-10-CM | POA: Diagnosis present

## 2022-03-13 DIAGNOSIS — E861 Hypovolemia: Secondary | ICD-10-CM | POA: Diagnosis present

## 2022-03-13 DIAGNOSIS — Z87442 Personal history of urinary calculi: Secondary | ICD-10-CM

## 2022-03-13 DIAGNOSIS — K219 Gastro-esophageal reflux disease without esophagitis: Secondary | ICD-10-CM | POA: Diagnosis present

## 2022-03-13 DIAGNOSIS — I252 Old myocardial infarction: Secondary | ICD-10-CM

## 2022-03-13 DIAGNOSIS — R0602 Shortness of breath: Secondary | ICD-10-CM | POA: Diagnosis present

## 2022-03-13 DIAGNOSIS — Z66 Do not resuscitate: Secondary | ICD-10-CM | POA: Diagnosis present

## 2022-03-13 DIAGNOSIS — Z888 Allergy status to other drugs, medicaments and biological substances status: Secondary | ICD-10-CM

## 2022-03-13 DIAGNOSIS — Z90710 Acquired absence of both cervix and uterus: Secondary | ICD-10-CM

## 2022-03-13 DIAGNOSIS — Z681 Body mass index (BMI) 19 or less, adult: Secondary | ICD-10-CM

## 2022-03-13 DIAGNOSIS — E43 Unspecified severe protein-calorie malnutrition: Secondary | ICD-10-CM | POA: Diagnosis present

## 2022-03-13 DIAGNOSIS — E039 Hypothyroidism, unspecified: Secondary | ICD-10-CM | POA: Diagnosis not present

## 2022-03-13 DIAGNOSIS — Z885 Allergy status to narcotic agent status: Secondary | ICD-10-CM

## 2022-03-13 DIAGNOSIS — Z79899 Other long term (current) drug therapy: Secondary | ICD-10-CM

## 2022-03-13 DIAGNOSIS — R7303 Prediabetes: Secondary | ICD-10-CM | POA: Diagnosis present

## 2022-03-13 DIAGNOSIS — Z79890 Hormone replacement therapy (postmenopausal): Secondary | ICD-10-CM

## 2022-03-13 DIAGNOSIS — R32 Unspecified urinary incontinence: Secondary | ICD-10-CM | POA: Diagnosis present

## 2022-03-13 LAB — CBC WITH DIFFERENTIAL/PLATELET
Abs Immature Granulocytes: 0.11 10*3/uL — ABNORMAL HIGH (ref 0.00–0.07)
Basophils Absolute: 0.1 10*3/uL (ref 0.0–0.1)
Basophils Relative: 0 %
Eosinophils Absolute: 0 10*3/uL (ref 0.0–0.5)
Eosinophils Relative: 0 %
HCT: 34.2 % — ABNORMAL LOW (ref 36.0–46.0)
Hemoglobin: 10.8 g/dL — ABNORMAL LOW (ref 12.0–15.0)
Immature Granulocytes: 1 %
Lymphocytes Relative: 11 %
Lymphs Abs: 2.2 10*3/uL (ref 0.7–4.0)
MCH: 28.2 pg (ref 26.0–34.0)
MCHC: 31.6 g/dL (ref 30.0–36.0)
MCV: 89.3 fL (ref 80.0–100.0)
Monocytes Absolute: 1.5 10*3/uL — ABNORMAL HIGH (ref 0.1–1.0)
Monocytes Relative: 7 %
Neutro Abs: 16.1 10*3/uL — ABNORMAL HIGH (ref 1.7–7.7)
Neutrophils Relative %: 81 %
Platelets: 435 10*3/uL — ABNORMAL HIGH (ref 150–400)
RBC: 3.83 MIL/uL — ABNORMAL LOW (ref 3.87–5.11)
RDW: 18.4 % — ABNORMAL HIGH (ref 11.5–15.5)
WBC: 19.9 10*3/uL — ABNORMAL HIGH (ref 4.0–10.5)
nRBC: 0 % (ref 0.0–0.2)

## 2022-03-13 LAB — COMPREHENSIVE METABOLIC PANEL
ALT: 13 U/L (ref 0–44)
AST: 16 U/L (ref 15–41)
Albumin: 3.3 g/dL — ABNORMAL LOW (ref 3.5–5.0)
Alkaline Phosphatase: 75 U/L (ref 38–126)
Anion gap: 8 (ref 5–15)
BUN: 17 mg/dL (ref 8–23)
CO2: 26 mmol/L (ref 22–32)
Calcium: 9.2 mg/dL (ref 8.9–10.3)
Chloride: 103 mmol/L (ref 98–111)
Creatinine, Ser: 0.67 mg/dL (ref 0.44–1.00)
GFR, Estimated: >60 mL/min (ref >60)
Glucose, Bld: 173 mg/dL — ABNORMAL HIGH (ref 70–99)
Potassium: 3.6 mmol/L (ref 3.5–5.1)
Sodium: 137 mmol/L (ref 135–145)
Total Bilirubin: 0.6 mg/dL (ref 0.3–1.2)
Total Protein: 7.4 g/dL (ref 6.5–8.1)

## 2022-03-13 LAB — BLOOD GAS, VENOUS
Acid-Base Excess: 5.2 mmol/L — ABNORMAL HIGH (ref 0.0–2.0)
Bicarbonate: 29 mmol/L — ABNORMAL HIGH (ref 20.0–28.0)
O2 Saturation: 82.9 %
Patient temperature: 37
pCO2, Ven: 39 mmHg — ABNORMAL LOW (ref 44–60)
pH, Ven: 7.48 — ABNORMAL HIGH (ref 7.25–7.43)
pO2, Ven: 47 mmHg — ABNORMAL HIGH (ref 32–45)

## 2022-03-13 LAB — SARS CORONAVIRUS 2 BY RT PCR: SARS Coronavirus 2 by RT PCR: NEGATIVE

## 2022-03-13 LAB — LACTIC ACID, PLASMA: Lactic Acid, Venous: 1.5 mmol/L (ref 0.5–1.9)

## 2022-03-13 MED ORDER — SODIUM CHLORIDE 0.9 % IV SOLN
500.0000 mg | INTRAVENOUS | Status: DC
  Administered 2022-03-13: 500 mg via INTRAVENOUS
  Filled 2022-03-13: qty 5

## 2022-03-13 MED ORDER — IPRATROPIUM BROMIDE 0.02 % IN SOLN
0.5000 mg | Freq: Once | RESPIRATORY_TRACT | Status: AC
  Administered 2022-03-13: 0.5 mg via RESPIRATORY_TRACT
  Filled 2022-03-13: qty 2.5

## 2022-03-13 MED ORDER — MELOXICAM 15 MG PO TABS
15.0000 mg | ORAL_TABLET | Freq: Every day | ORAL | Status: DC
  Administered 2022-03-14 – 2022-03-16 (×3): 15 mg via ORAL
  Filled 2022-03-13 (×3): qty 1

## 2022-03-13 MED ORDER — MORPHINE SULFATE (PF) 4 MG/ML IV SOLN
4.0000 mg | Freq: Once | INTRAVENOUS | Status: AC
  Administered 2022-03-13: 4 mg via INTRAVENOUS
  Filled 2022-03-13: qty 1

## 2022-03-13 MED ORDER — LEVOTHYROXINE SODIUM 137 MCG PO TABS
137.0000 ug | ORAL_TABLET | Freq: Every day | ORAL | Status: DC
  Administered 2022-03-14 – 2022-03-16 (×3): 137 ug via ORAL
  Filled 2022-03-13 (×3): qty 1

## 2022-03-13 MED ORDER — ALBUTEROL SULFATE (2.5 MG/3ML) 0.083% IN NEBU
5.0000 mg | INHALATION_SOLUTION | Freq: Once | RESPIRATORY_TRACT | Status: AC
  Administered 2022-03-13: 5 mg via RESPIRATORY_TRACT
  Filled 2022-03-13: qty 6

## 2022-03-13 MED ORDER — VITAMIN D 25 MCG (1000 UNIT) PO TABS
1000.0000 [IU] | ORAL_TABLET | Freq: Every day | ORAL | Status: DC
  Administered 2022-03-14 – 2022-03-16 (×3): 1000 [IU] via ORAL
  Filled 2022-03-13 (×3): qty 1

## 2022-03-13 MED ORDER — POTASSIUM CHLORIDE IN NACL 20-0.9 MEQ/L-% IV SOLN
INTRAVENOUS | Status: DC
  Filled 2022-03-13 (×6): qty 1000

## 2022-03-13 MED ORDER — METHOCARBAMOL 500 MG PO TABS
500.0000 mg | ORAL_TABLET | Freq: Every day | ORAL | Status: DC
  Administered 2022-03-14 – 2022-03-16 (×3): 500 mg via ORAL
  Filled 2022-03-13 (×3): qty 1

## 2022-03-13 MED ORDER — GUAIFENESIN ER 600 MG PO TB12
600.0000 mg | ORAL_TABLET | Freq: Two times a day (BID) | ORAL | Status: DC
  Administered 2022-03-13 – 2022-03-16 (×6): 600 mg via ORAL
  Filled 2022-03-13 (×6): qty 1

## 2022-03-13 MED ORDER — SENNA 8.6 MG PO TABS
1.0000 | ORAL_TABLET | Freq: Two times a day (BID) | ORAL | Status: DC | PRN
Start: 2022-03-13 — End: 2022-03-16

## 2022-03-13 MED ORDER — MAGNESIUM OXIDE -MG SUPPLEMENT 400 (240 MG) MG PO TABS
400.0000 mg | ORAL_TABLET | Freq: Every day | ORAL | Status: DC
  Administered 2022-03-14 – 2022-03-15 (×2): 400 mg via ORAL
  Filled 2022-03-13 (×2): qty 1

## 2022-03-13 MED ORDER — ACETAMINOPHEN 650 MG RE SUPP: 650.0000 mg | Freq: Four times a day (QID) | RECTAL | Status: DC | PRN

## 2022-03-13 MED ORDER — ONDANSETRON HCL 4 MG PO TABS: 4.0000 mg | ORAL_TABLET | Freq: Four times a day (QID) | ORAL | Status: DC | PRN

## 2022-03-13 MED ORDER — IPRATROPIUM-ALBUTEROL 0.5-2.5 (3) MG/3ML IN SOLN: 3.0000 mL | Freq: Four times a day (QID) | RESPIRATORY_TRACT | Status: DC

## 2022-03-13 MED ORDER — LIDOCAINE 5 % EX PTCH
1.0000 | MEDICATED_PATCH | CUTANEOUS | Status: DC
Start: 2022-03-13 — End: 2022-03-16
  Administered 2022-03-13 – 2022-03-15 (×2): 1 via TRANSDERMAL
  Filled 2022-03-13 (×3): qty 1

## 2022-03-13 MED ORDER — OXYBUTYNIN CHLORIDE 5 MG PO TABS
5.0000 mg | ORAL_TABLET | Freq: Three times a day (TID) | ORAL | Status: DC
  Administered 2022-03-13 – 2022-03-16 (×8): 5 mg via ORAL
  Filled 2022-03-13 (×8): qty 1

## 2022-03-13 MED ORDER — PANTOPRAZOLE SODIUM 40 MG PO TBEC
40.0000 mg | DELAYED_RELEASE_TABLET | Freq: Every day | ORAL | Status: DC
  Administered 2022-03-14 – 2022-03-16 (×3): 40 mg via ORAL
  Filled 2022-03-13 (×3): qty 1

## 2022-03-13 MED ORDER — ENOXAPARIN SODIUM 30 MG/0.3ML IJ SOSY
30.0000 mg | PREFILLED_SYRINGE | INTRAMUSCULAR | Status: DC
  Administered 2022-03-14 – 2022-03-16 (×3): 30 mg via SUBCUTANEOUS
  Filled 2022-03-13 (×3): qty 0.3

## 2022-03-13 MED ORDER — SODIUM CHLORIDE 0.9 % IV SOLN
2.0000 g | Freq: Once | INTRAVENOUS | Status: AC
  Administered 2022-03-13: 2 g via INTRAVENOUS
  Filled 2022-03-13: qty 12.5

## 2022-03-13 MED ORDER — ACETAMINOPHEN 500 MG PO TABS
1000.0000 mg | ORAL_TABLET | Freq: Once | ORAL | Status: DC
  Filled 2022-03-13: qty 2

## 2022-03-13 MED ORDER — IPRATROPIUM-ALBUTEROL 0.5-2.5 (3) MG/3ML IN SOLN
3.0000 mL | Freq: Four times a day (QID) | RESPIRATORY_TRACT | Status: DC | PRN
  Administered 2022-03-15: 3 mL via RESPIRATORY_TRACT
  Filled 2022-03-13: qty 3

## 2022-03-13 MED ORDER — MAGNESIUM HYDROXIDE 400 MG/5ML PO SUSP: 30.0000 mL | Freq: Every day | ORAL | Status: DC | PRN

## 2022-03-13 MED ORDER — VANCOMYCIN HCL IN DEXTROSE 1-5 GM/200ML-% IV SOLN
1000.0000 mg | Freq: Once | INTRAVENOUS | Status: AC
Start: 2022-03-13 — End: 2022-03-13
  Administered 2022-03-13: 1000 mg via INTRAVENOUS
  Filled 2022-03-13: qty 200

## 2022-03-13 MED ORDER — TRAZODONE HCL 50 MG PO TABS: 25.0000 mg | ORAL_TABLET | Freq: Every evening | ORAL | Status: DC | PRN

## 2022-03-13 MED ORDER — DIAZEPAM 2 MG PO TABS
2.0000 mg | ORAL_TABLET | Freq: Four times a day (QID) | ORAL | Status: DC | PRN
  Administered 2022-03-15 – 2022-03-16 (×3): 2 mg via ORAL
  Filled 2022-03-13 (×3): qty 1

## 2022-03-13 MED ORDER — ROSUVASTATIN CALCIUM 20 MG PO TABS
20.0000 mg | ORAL_TABLET | Freq: Every day | ORAL | Status: DC
  Administered 2022-03-13 – 2022-03-15 (×3): 20 mg via ORAL
  Filled 2022-03-13 (×3): qty 1

## 2022-03-13 MED ORDER — IOHEXOL 350 MG/ML SOLN
80.0000 mL | Freq: Once | INTRAVENOUS | Status: AC | PRN
  Administered 2022-03-13: 75 mL via INTRAVENOUS

## 2022-03-13 MED ORDER — ONDANSETRON HCL 4 MG/2ML IJ SOLN: 4.0000 mg | Freq: Four times a day (QID) | INTRAMUSCULAR | Status: DC | PRN

## 2022-03-13 MED ORDER — QUETIAPINE FUMARATE 100 MG PO TABS
300.0000 mg | ORAL_TABLET | Freq: Every day | ORAL | Status: DC
  Administered 2022-03-13 – 2022-03-15 (×3): 300 mg via ORAL
  Filled 2022-03-13: qty 3
  Filled 2022-03-13: qty 1
  Filled 2022-03-13: qty 3

## 2022-03-13 MED ORDER — NITROGLYCERIN 0.4 MG SL SUBL
0.4000 mg | SUBLINGUAL_TABLET | SUBLINGUAL | Status: DC | PRN
Start: 2022-03-13 — End: 2022-03-16

## 2022-03-13 MED ORDER — HYDROCODONE-ACETAMINOPHEN 5-325 MG PO TABS
1.0000 | ORAL_TABLET | ORAL | Status: DC | PRN
  Administered 2022-03-14 – 2022-03-15 (×4): 1 via ORAL
  Filled 2022-03-13 (×4): qty 1

## 2022-03-13 MED ORDER — ADULT MULTIVITAMIN W/MINERALS CH
1.0000 | ORAL_TABLET | Freq: Every day | ORAL | Status: DC
  Administered 2022-03-14 – 2022-03-16 (×3): 1 via ORAL
  Filled 2022-03-13 (×3): qty 1

## 2022-03-13 MED ORDER — FAMOTIDINE 20 MG PO TABS
40.0000 mg | ORAL_TABLET | Freq: Every day | ORAL | Status: DC
  Administered 2022-03-14 – 2022-03-16 (×3): 40 mg via ORAL
  Filled 2022-03-13 (×3): qty 2

## 2022-03-13 MED ORDER — SODIUM CHLORIDE 0.9 % IV SOLN
2.0000 g | INTRAVENOUS | Status: DC
  Administered 2022-03-14: 2 g via INTRAVENOUS
  Filled 2022-03-13: qty 20

## 2022-03-13 MED ORDER — TAMSULOSIN HCL 0.4 MG PO CAPS: 0.4000 mg | ORAL_CAPSULE | Freq: Every day | ORAL | Status: DC

## 2022-03-13 MED ORDER — BUSPIRONE HCL 5 MG PO TABS
5.0000 mg | ORAL_TABLET | Freq: Three times a day (TID) | ORAL | Status: DC
  Administered 2022-03-13 – 2022-03-16 (×8): 5 mg via ORAL
  Filled 2022-03-13 (×8): qty 1

## 2022-03-13 MED ORDER — IPRATROPIUM-ALBUTEROL 0.5-2.5 (3) MG/3ML IN SOLN
3.0000 mL | Freq: Three times a day (TID) | RESPIRATORY_TRACT | Status: DC
  Administered 2022-03-14 – 2022-03-16 (×5): 3 mL via RESPIRATORY_TRACT
  Filled 2022-03-13 (×7): qty 3

## 2022-03-13 MED ORDER — SODIUM CHLORIDE 0.9 % IV BOLUS
1000.0000 mL | Freq: Once | INTRAVENOUS | Status: AC
  Administered 2022-03-13: 1000 mL via INTRAVENOUS

## 2022-03-13 MED ORDER — ACETAMINOPHEN 325 MG PO TABS
650.0000 mg | ORAL_TABLET | Freq: Four times a day (QID) | ORAL | Status: DC | PRN
  Administered 2022-03-13 – 2022-03-15 (×3): 650 mg via ORAL
  Filled 2022-03-13 (×3): qty 2

## 2022-03-13 NOTE — Assessment & Plan Note (Signed)
I counseled her for smoking cessation and she will receive further counseling here. ?

## 2022-03-13 NOTE — ED Notes (Signed)
Patient transported to CT 

## 2022-03-13 NOTE — Assessment & Plan Note (Signed)
-   We will continue BuSpar, Valium and Seroquel.

## 2022-03-13 NOTE — H&P (Signed)
Home     [place]   PATIENT NAME: Tracy Park    MR#:  [id number]  DATE OF BIRTH:  1956/08/31  DATE OF ADMISSION:  03/13/2022  PRIMARY CARE PHYSICIAN: Irving Shows, PA-C   Patient is coming from: Home  REQUESTING/REFERRING PHYSICIAN: Drenda Freeze, MD   CHIEF COMPLAINT:   Chief Complaint  Patient presents with   Shortness of Breath    HISTORY OF PRESENT ILLNESS:  Tracy Park is a 66 y.o. female with medical history significant for bipolar disorder hypothyroidism and sleep apnea as well as lung cancer chronic respiratory failure on PRN O2 at 2 L/min, who presented to the ER with acute onset of fever and chills since yesterday as well as worsening dyspnea over the last few days and cough productive of clear sputum.  The patient was seen a couple days ago for that.  Which came to the ER temperature was 101 and she took 1 p.o. Tylenol.  She admits to urinary frequency and urgency without dysuria or hematuria or flank pain.  She has been having urinary incontinence.  No nausea or vomiting or abdominal pain.  No rhinorrhea or nasal congestion or sore throat or earache.  No headache or dizziness or blurred vision.  She denies any chest pain or palpitations.  She continues to smoke about 5 cigarettes/day.  ED Course: Upon presentation to the ER heart rate was 105 and respirate was 26 and temperature was 90 BP was 110/66 and later 99/61.  Labs revealed a blood glucose of 173 and albumin 3.3 with otherwise unremarkable CMP.  VBG showed pH 7.48 and HCO3 29.  CBC showed no cytosis-with neutrophilia and anemia close to baseline.  Blood cultures were drawn EKG as reviewed by me : EKG showed sinus tachycardia with a rate of 107 with PACs and right atrial enlargement, LAD with left anterior fascicular block and LVH with secondary repolarization abnormality and T wave inversion laterally.. Imaging: CT angiography revealed the following: 1. No evidence acute pulmonary embolism. 2.  Postsurgical change in the LEFT lung consistent with lobe LEFT upper lobectomy. 3. Improved consolidation in the LEFT upper lobe is favored resolving infectious process. Recommend follow-up CT to ensure resolution.  The patient was given IV vancomycin albuterol Atrovent nebulizer therapy, morphine sulfate medical telemetry bed for further evaluation and management. PAST MEDICAL HISTORY:   Past Medical History:  Diagnosis Date   Anginal pain ([place])    Anxiety    Bipolar disorder ([place])    Cancer ([place])    COPD (chronic obstructive pulmonary disease) ([place])    Dyspnea    Family history of adverse reaction to anesthesia    History of kidney stones    Hydroureteronephrosis 08/16/2021   Hypothyroidism    Lung cancer ([place])    Myocardial infarction ([place])    PTSD (post-traumatic stress disorder)    Sleep apnea    Thyroid disease     PAST SURGICAL HISTORY:   Past Surgical History:  Procedure Laterality Date   ABDOMINAL HYSTERECTOMY     BACK SURGERY     CYSTOSCOPY W/ URETERAL STENT PLACEMENT Right 05/03/2021   Procedure: CYSTOSCOPY WITH RETROGRADE PYELOGRAM/URETERAL STENT PLACEMENT;  Surgeon: Ardis Hughs, MD;  Location: [facility];  Service: Urology;  Laterality: Right;   EYE SURGERY     kidney stent     thyroidectomy      SOCIAL HISTORY:   Social History   Tobacco Use   Smoking status: Every Day  Packs/day: 0.15    Types: Cigarettes   Smokeless tobacco: Never  Substance Use Topics   Alcohol use: Not Currently    FAMILY HISTORY:   Family History  Family history unknown: Yes    DRUG ALLERGIES:   Allergies  Allergen Reactions   Red Dye Hives and Itching   Oxycontin [Oxycodone Hcl]     Other reaction(s): Hallucinations   Prednisone Hives and Nausea And Vomiting   Aspirin Hives   Tape Rash    ONLY USE PAPER TAPE    REVIEW OF SYSTEMS:   ROS As per history of present illness. All pertinent systems were reviewed above. Constitutional, HEENT, cardiovascular,  respiratory, GI, GU, musculoskeletal, neuro, psychiatric, endocrine, integumentary and hematologic systems were reviewed and are otherwise negative/unremarkable except for positive findings mentioned above in the HPI.   MEDICATIONS AT HOME:   Prior to Admission medications   Medication Sig Start Date End Date Taking? Authorizing Provider  acetaminophen (TYLENOL) 325 MG tablet Take 650 mg by mouth every 6 (six) hours as needed for moderate pain or headache.    [provider]  azithromycin (ZITHROMAX) 250 MG tablet Take 1 tablet (250 mg total) by mouth daily. 03/08/22   Orpah Greek, MD  busPIRone (BUSPAR) 5 MG tablet Take 5 mg by mouth 3 (three) times daily.    [provider]  cefdinir (OMNICEF) 300 MG capsule Take 1 capsule (300 mg total) by mouth 2 (two) times daily. 03/07/22   Orpah Greek, MD  Cholecalciferol 25 MCG (1000 UT) tablet Take 1,000 Units by mouth daily.    [provider]  diazepam (VALIUM) 2 MG tablet Take 1 tablet (2 mg total) by mouth every 6 (six) hours as needed for muscle spasms. 03/11/22   Lacretia Leigh, MD  famotidine (PEPCID) 40 MG tablet Take 40 mg by mouth daily. 02/14/20   [provider]  HYDROcodone-acetaminophen (NORCO/VICODIN) 5-325 MG tablet Take 1 tablet by mouth every 4 (four) hours as needed for moderate pain. 03/03/22 03/03/23  Fransico Meadow, PA-C  ipratropium-albuterol (DUONEB) 0.5-2.5 (3) MG/3ML SOLN Take 3 mLs by nebulization every 6 (six) hours as needed (shortness of breath). 02/10/22   [provider]  levothyroxine (SYNTHROID) 137 MCG tablet Take 137 mcg by mouth daily before breakfast. 03/14/20   [provider]  lidocaine (LIDODERM) 5 % Place 1 patch onto the skin daily. Remove & Discard patch within 12 hours or as directed by MD 03/11/22   Lacretia Leigh, MD  Magnesium Oxide 400 MG CAPS Take 1 capsule (400 mg total) by mouth daily at 12 noon. Patient taking differently: Take 400 mg by  mouth daily. 03/01/22   Florencia Reasons, MD  meloxicam (MOBIC) 15 MG tablet Take 15 mg by mouth daily. 04/07/20   [provider]  methocarbamol (ROBAXIN) 500 MG tablet Take 500 mg by mouth daily. 02/10/20   [provider]  Multiple Vitamins-Minerals (CENTRUM WOMEN) TABS Take 1 tablet by mouth daily.    [provider]  nitroGLYCERIN (NITROSTAT) 0.4 MG SL tablet Place 0.4 mg under the tongue every 5 (five) minutes as needed for chest pain.    [provider]  omeprazole (PRILOSEC) 20 MG capsule Take 40 mg by mouth daily.    [provider]  oxybutynin (DITROPAN) 5 MG tablet Take 5 mg by mouth 3 (three) times daily. 03/15/20   [provider]  OXYGEN Inhale 2 L into the lungs continuous.    [provider]  QUEtiapine (  SEROQUEL) 300 MG tablet Take 300 mg by mouth at bedtime.    [provider]  rosuvastatin (CRESTOR) 20 MG tablet Take 20 mg by mouth daily. 01/15/20   [provider]  senna (SENOKOT) 8.6 MG TABS tablet Take 1 tablet by mouth 2 (two) times daily as needed for mild constipation.    [provider]  tamsulosin (FLOMAX) 0.4 MG CAPS capsule Take 0.4 mg by mouth daily. 03/21/20   [provider]  TRELEGY ELLIPTA 100-62.5-25 MCG/ACT AEPB Take 1 puff by mouth daily. 02/06/22   [provider]      VITAL SIGNS:  Blood pressure 108/69, pulse 97, temperature (!) 101 F (38.3 C), temperature source Rectal, resp. rate 12, weight 45.4 kg, SpO2 91 %.  PHYSICAL EXAMINATION:  Physical Exam  GENERAL:  66 y.o.-year-old female patient lying in the bed with no acute distress.  EYES: Pupils equal, round, reactive to light and accommodation. No scleral icterus. Extraocular muscles intact.  HEENT: Head atraumatic, normocephalic. Oropharynx and nasopharynx clear.  NECK:  Supple, no jugular venous distention. No thyroid enlargement, no tenderness.  LUNGS: Normal breath sounds bilaterally, no wheezing,  rales,rhonchi or crepitation. No use of accessory muscles of respiration.  CARDIOVASCULAR: Regular rate and rhythm, S1, S2 normal. No murmurs, rubs, or gallops.  ABDOMEN: Soft, nondistended, nontender. Bowel sounds present. No organomegaly or mass.  EXTREMITIES: No pedal edema, cyanosis, or clubbing.  NEUROLOGIC: Cranial nerves II through XII are intact. Muscle strength 5/5 in all extremities. Sensation intact. Gait not checked.  PSYCHIATRIC: The patient is alert and oriented x 3.  Normal affect and good eye contact. SKIN: No obvious rash, lesion, or ulcer.   LABORATORY PANEL:   CBC Recent Labs  Lab 03/13/22 1852  WBC 19.9*  HGB 10.8*  HCT 34.2*  PLT 435*   ------------------------------------------------------------------------------------------------------------------  Chemistries  Recent Labs  Lab 03/13/22 1852  NA 137  K 3.6  CL 103  CO2 26  GLUCOSE 173*  BUN 17  CREATININE 0.67  CALCIUM 9.2  AST 16  ALT 13  ALKPHOS 75  BILITOT 0.6   ------------------------------------------------------------------------------------------------------------------  Cardiac Enzymes No results for input(s): "TROPONINI" in the last 168 hours. ------------------------------------------------------------------------------------------------------------------  RADIOLOGY:  CT Angio Chest PE W and/or Wo Contrast  Result Date: 03/13/2022 CLINICAL DATA:  Concern for pulmonary embolism. Short of breath. Lung pain. Home O2. history of lung cancer. * Tracking Code: BO * EXAM: CT ANGIOGRAPHY CHEST WITH CONTRAST TECHNIQUE: Multidetector CT imaging of the chest was performed using the standard protocol during bolus administration of intravenous contrast. Multiplanar CT image reconstructions and MIPs were obtained to evaluate the vascular anatomy. RADIATION DOSE REDUCTION: This exam was performed according to the departmental dose-optimization program which includes automated exposure control,  adjustment of the mA and/or kV according to patient size and/or use of iterative reconstruction technique. CONTRAST:  41mL OMNIPAQUE IOHEXOL 350 MG/ML SOLN COMPARISON:  CT 02/25/2022 FINDINGS: Cardiovascular: No evidence of acute pulmonary embolism within the RIGHT pulmonary arteries. There is truncation of the LEFT upper lobe pulmonary arteries related to prior lung cancer surgery. No evidence of LEFT lower lobe pulmonary embolism. Mediastinum/Nodes: No axillary or supraclavicular adenopathy. No mediastinal or hilar adenopathy. No pericardial fluid. Esophagus normal. Lungs/Pleura: There is some mild improvement in consolidation in the superior LEFT lung compared to CT 02/25/2022. Persistent consolidation remains albeit reduced. Centrilobular emphysema in the RIGHT upper lobe. No suspicious nodularity. Upper Abdomen: Limited view of the liver, kidneys, pancreas are unremarkable. Normal adrenal glands. Musculoskeletal: No  aggressive osseous lesion. Review of the MIP images confirms the above findings. IMPRESSION: 1. No evidence acute pulmonary embolism. 2. Postsurgical change in the LEFT lung consistent with lobe LEFT upper lobectomy. 3. Improved consolidation in the LEFT upper lobe is favored resolving infectious process. Recommend follow-up CT to ensure resolution. Electronically Signed   By: Suzy Bouchard M.D.   On: 03/13/2022 20:56   DG Chest Port 1 View  Result Date: 03/13/2022 CLINICAL DATA:  History of lung cancer presenting with shortness of breath. EXAM: PORTABLE CHEST 1 VIEW COMPARISON:  March 06, 2022 FINDINGS: The heart size and mediastinal contours are within normal limits. Stable postoperative changes are seen within the left apex and upper left lung with subsequent left apical scarring and pleural opacification. Stable left-sided volume loss is seen. The right lung is clear. Surgical clips are seen along the suprahilar and infrahilar regions on the left. There is no evidence of a pleural effusion  or pneumothorax. The visualized skeletal structures are unremarkable. IMPRESSION: Stable postoperative changes in the left hemithorax as described above, without evidence of acute or active cardiopulmonary disease. Electronically Signed   By: Virgina Norfolk M.D.   On: 03/13/2022 20:21      IMPRESSION AND PLAN:  Assessment and Plan: * Sepsis due to pneumonia [facility]) - The patient will be admitted to a telemetry bed. - Sepsis is manifested by Leukocytosis, fever, tachypnea and tachycardia. - We will continue antibiotic therapy with IV Rocephin and Zithromax. - Mucolytic therapy will be provided. - We will follow blood cultures and obtain sputum culture. - The patient will be hydrated with IV normal saline  Hypothyroidism (acquired) - We will continue Synthroid.  Anxiety and depression - We will continue BuSpar, Valium and Seroquel.  Tobacco abuse - I counseled her for smoking cessation and she will receive further counseling here.  History of lung cancer - The patient is status post left upper lobe lobectomy.  Dyslipidemia - We will continue statin therapy.  GERD (gastroesophageal reflux disease) - We will continue PPI therapy and H2 blocker therapy.   DVT prophylaxis: Lovenox.  Advanced Care Planning:  Code Status: The patient is DNR/DNI. Family Communication:  The plan of care was discussed in details with the patient (and family). I answered all questions. The patient agreed to proceed with the above mentioned plan. Further management will depend upon hospital course. Disposition Plan: Back to previous home environment Consults called: none.  All the records are reviewed and case discussed with ED provider.  Status is: Inpatient  At the time of the admission, it appears that the appropriate admission status for this patient is inpatient.  This is judged to be reasonable and necessary in order to provide the required intensity of service to ensure the patient's safety  given the presenting symptoms, physical exam findings and initial radiographic and laboratory data in the context of comorbid conditions.  The patient requires inpatient status due to high intensity of service, high risk of further deterioration and high frequency of surveillance required.  I certify that at the time of admission, it is my clinical judgment that the patient will require inpatient hospital care extending more than 2 midnights.                            Dispo: The patient is from: Home              Anticipated d/c is to: Home  Patient currently is not medically stable to d/c.              Difficult to place patient: No  Christel Mormon M.D on 03/13/2022 at 10:43 PM  [facility]   From 7 PM-7 AM, contact night-coverage [email or web address]  CC: Primary care physician; Irving Shows, PA-C

## 2022-03-13 NOTE — Assessment & Plan Note (Signed)
-   We will continue Synthroid. 

## 2022-03-13 NOTE — ED Triage Notes (Signed)
Pt BIB EMS from home, pt lives alone. Pt has hx of lung cancer. Pt c/o SOB and lung pain. Pt states its worsened over last few weeks. Pt has taken Valium today with no relief. Pt has home O2 to wear as needed. EMS put pt on 2L O2 [place]  110 HR 91% RA 96% on 2L [place] O2 Pain 8/10

## 2022-03-13 NOTE — ED Provider Notes (Signed)
[facility] DEPT Provider Note   CSN: [id number] Arrival date & time: 03/13/22  1839     History  Chief Complaint  Patient presents with   Shortness of Breath    Tracy Park is a 66 y.o. female hx of lung cancer on 2 L [place] as needed, recent admission for pneumonia, here presenting with fever and cough.  Patient states that 2 days ago she was seen for flank pain and had a negative work-up at that time.  Patient states that she has worsening shortness of breath for the last several days.  She started running a fever today.  She states that her fever was 101 before she came here.  She was given Tylenol prior to arrival. Patient states that she feels very short of breath despite being on oxygen.  Patient is not on blood thinner.  The history is provided by the patient.       Home Medications Prior to Admission medications   Medication Sig Start Date End Date Taking? Authorizing Provider  acetaminophen (TYLENOL) 325 MG tablet Take 650 mg by mouth every 6 (six) hours as needed for moderate pain or headache.    [provider]  azithromycin (ZITHROMAX) 250 MG tablet Take 1 tablet (250 mg total) by mouth daily. 03/08/22   Orpah Greek, MD  busPIRone (BUSPAR) 5 MG tablet Take 5 mg by mouth 3 (three) times daily.    [provider]  cefdinir (OMNICEF) 300 MG capsule Take 1 capsule (300 mg total) by mouth 2 (two) times daily. 03/07/22   Orpah Greek, MD  Cholecalciferol 25 MCG (1000 UT) tablet Take 1,000 Units by mouth daily.    [provider]  diazepam (VALIUM) 2 MG tablet Take 1 tablet (2 mg total) by mouth every 6 (six) hours as needed for muscle spasms. 03/11/22   Lacretia Leigh, MD  famotidine (PEPCID) 40 MG tablet Take 40 mg by mouth daily. 02/14/20   [provider]  HYDROcodone-acetaminophen (NORCO/VICODIN) 5-325 MG tablet Take 1 tablet by mouth every 4 (four) hours as needed for moderate pain. 03/03/22  03/03/23  Fransico Meadow, PA-C  ipratropium-albuterol (DUONEB) 0.5-2.5 (3) MG/3ML SOLN Take 3 mLs by nebulization every 6 (six) hours as needed (shortness of breath). 02/10/22   [provider]  levothyroxine (SYNTHROID) 137 MCG tablet Take 137 mcg by mouth daily before breakfast. 03/14/20   [provider]  lidocaine (LIDODERM) 5 % Place 1 patch onto the skin daily. Remove & Discard patch within 12 hours or as directed by MD 03/11/22   Lacretia Leigh, MD  Magnesium Oxide 400 MG CAPS Take 1 capsule (400 mg total) by mouth daily at 12 noon. Patient taking differently: Take 400 mg by mouth daily. 03/01/22   Florencia Reasons, MD  meloxicam (MOBIC) 15 MG tablet Take 15 mg by mouth daily. 04/07/20   [provider]  methocarbamol (ROBAXIN) 500 MG tablet Take 500 mg by mouth daily. 02/10/20   [provider]  Multiple Vitamins-Minerals (CENTRUM WOMEN) TABS Take 1 tablet by mouth daily.    [provider]  nitroGLYCERIN (NITROSTAT) 0.4 MG SL tablet Place 0.4 mg under the tongue every 5 (five) minutes as needed for chest pain.    [provider]  omeprazole (PRILOSEC) 20 MG capsule Take 40 mg by mouth daily.    [provider]  oxybutynin (DITROPAN) 5 MG tablet Take 5 mg by mouth 3 (three) times daily. 03/15/20   [provider]  OXYGEN Inhale 2 L into the lungs continuous.    [provider]  QUEtiapine (SEROQUEL) 300 MG tablet Take 300 mg by mouth at bedtime.    [provider]  rosuvastatin (CRESTOR) 20 MG tablet Take 20 mg by mouth daily. 01/15/20   [provider]  senna (SENOKOT) 8.6 MG TABS tablet Take 1 tablet by mouth 2 (two) times daily as needed for mild constipation.    [provider]  tamsulosin (FLOMAX) 0.4 MG CAPS capsule Take 0.4 mg by mouth daily. 03/21/20   [provider]  TRELEGY ELLIPTA 100-62.5-25 MCG/ACT AEPB Take 1 puff by mouth daily. 02/06/22   [provider]      Allergies     Red dye, Oxycontin [oxycodone hcl], Prednisone, Aspirin, and Tape    Review of Systems   Review of Systems  Respiratory:  Positive for shortness of breath.   All other systems reviewed and are negative.   Physical Exam Updated Vital Signs BP 100/74   Pulse 96   Temp (!) 101 F (38.3 C) (Rectal)   Resp (!) 25   SpO2 99%  Physical Exam Vitals and nursing note reviewed.  Constitutional:      Comments: Tachypneic  HENT:     Head: Normocephalic.     Mouth/Throat:     Pharynx: Oropharynx is clear.  Eyes:     Extraocular Movements: Extraocular movements intact.     Pupils: Pupils are equal, round, and reactive to light.  Cardiovascular:     Rate and Rhythm: Normal rate and regular rhythm.  Pulmonary:     Comments: Tachypneic, crackles on right base Abdominal:     General: Bowel sounds are normal.     Palpations: Abdomen is soft.  Musculoskeletal:        General: Normal range of motion.     Cervical back: Normal range of motion and neck supple.  Skin:    General: Skin is warm.  Neurological:     General: No focal deficit present.     Mental Status: She is oriented to person, place, and time.  Psychiatric:        Mood and Affect: Mood normal.        Behavior: Behavior normal.     ED Results / Procedures / Treatments   Labs (all labs ordered are listed, but only abnormal results are displayed) Labs Reviewed  CBC WITH DIFFERENTIAL/PLATELET - Abnormal; Notable for the following components:      Result Value   WBC 19.9 (*)    RBC 3.83 (*)    Hemoglobin 10.8 (*)    HCT 34.2 (*)    RDW 18.4 (*)    Platelets 435 (*)    Neutro Abs 16.1 (*)    Monocytes Absolute 1.5 (*)    Abs Immature Granulocytes 0.11 (*)    All other components within normal limits  COMPREHENSIVE METABOLIC PANEL - Abnormal; Notable for the following components:   Glucose, Bld 173 (*)    Albumin 3.3 (*)    All other components within normal limits  BLOOD GAS, VENOUS - Abnormal; Notable for the  following components:   pH, Ven 7.48 (*)    pCO2, Ven 39 (*)    pO2, Ven 47 (*)    Bicarbonate 29.0 (*)    Acid-Base Excess 5.2 (*)    All other components within normal limits  SARS CORONAVIRUS 2 BY RT PCR  CULTURE, BLOOD (ROUTINE X 2)  CULTURE, BLOOD (ROUTINE X  2)  LACTIC ACID, PLASMA  LACTIC ACID, PLASMA    EKG EKG Interpretation  Date/Time:  Wednesday March 13 2022 18:51:27 EDT Ventricular Rate:  107 PR Interval:  120 QRS Duration: 98 QT Interval:  364 QTC Calculation: 486 R Axis:   -67 Text Interpretation: Sinus tachycardia Atrial premature complexes Right atrial enlargement LAD, consider left anterior fascicular block LVH with secondary repolarization abnormality Anterior ST elevation, probably due to LVH Borderline prolonged QT interval Since last tracing rate faster Confirmed by Wandra Arthurs [phone]) on 03/13/2022 7:00:35 PM  Radiology DG Lumbar Spine Complete  Result Date: 03/11/2022 CLINICAL DATA:  Left-sided flank pain radiating into the left leg, initial encounter EXAM: LUMBAR SPINE - COMPLETE 4+ VIEW COMPARISON:  08/20/2021 FINDINGS: Five lumbar type vertebral bodies are well visualized. Vertebral body height is well maintained. No pars defects are noted. Degenerative anterolisthesis of L4 on L5 is noted. This is degenerative in nature as no pars defects are seen. IMPRESSION: Stable degenerative change with anterolisthesis of L4 on L5. Electronically Signed   By: Inez Catalina M.D.   On: 03/11/2022 20:49    Procedures Procedures    Medications Ordered in ED Medications  sodium chloride 0.9 % bolus 1,000 mL (1,000 mLs Intravenous New Bag/Given 03/13/22 1927)  morphine (PF) 4 MG/ML injection 4 mg (4 mg Intravenous Given 03/13/22 1928)  albuterol (PROVENTIL) (2.5 MG/3ML) 0.083% nebulizer solution 5 mg (5 mg Nebulization Given 03/13/22 1930)  ipratropium (ATROVENT) nebulizer solution 0.5 mg (0.5 mg Nebulization Given 03/13/22 1931)    ED Course/ Medical Decision Making/ A&P                            Medical Decision Making Alnisa L Stubbe is a 66 y.o. female here presenting with shortness of breath and fever.  Patient was recently admitted for pneumonia.  Patient is febrile 101 in the ED.  Patient appears tachypneic despite being on 2 L nasal cannula. Concern for possible pneumonia versus pulmonary embolus. Plan to get CBC and CMP and lactate and cultures and chest x-ray and patient likely will need CTA chest.   9 pm I reviewed patient's labs and independently reviewed imaging studies. Patient's VBG is reassuing. COVID negative. WBC increased to 19. CXR clear but CTA showed pneumonia. Given HCAP coverage since she was recently admitted. Still tachypneic but O2 is above 90% on baseline 2 L. Hospitalist to admit for pneumonia   Amount and/or Complexity of Data Reviewed Labs: ordered. Decision-making details documented in ED Course. Radiology: ordered and independent interpretation performed. Decision-making details documented in ED Course. ECG/medicine tests: ordered and independent interpretation performed. Decision-making details documented in ED Course.  Risk Prescription drug management. Decision regarding hospitalization.    Final Clinical Impression(s) / ED Diagnoses Final diagnoses:  None    Rx / DC Orders ED Discharge Orders     None         Drenda Freeze, MD 03/15/22 1124

## 2022-03-13 NOTE — ED Notes (Signed)
Pt states she received childrens tylenol on the ambulance. Dr. Darl Householder aware.

## 2022-03-13 NOTE — Assessment & Plan Note (Addendum)
-   The patient will be admitted to a telemetry bed. - She has subsequent acute hypoxic respiratory failure. - Sepsis is manifested by Leukocytosis, fever, tachypnea and tachycardia. - We will continue antibiotic therapy with IV Rocephin and Zithromax. - Mucolytic therapy will be provided. - We will follow blood cultures and obtain sputum culture. - The patient will be hydrated with IV normal saline

## 2022-03-13 NOTE — Progress Notes (Signed)
A consult was received from an ED physician for cefepime per pharmacy dosing (for an indication other than meningitis). The patient's profile has been reviewed for ht/wt/allergies/indication/available labs. A one time order has been placed for the above antibiotics.  Further antibiotics/pharmacy consults should be ordered by admitting physician if indicated.                       Reuel Boom, PharmD, BCPS [phone] 03/13/2022, 8:30 PM

## 2022-03-13 NOTE — Assessment & Plan Note (Signed)
-   The patient is status post left upper lobe lobectomy.

## 2022-03-13 NOTE — Assessment & Plan Note (Signed)
-   We will continue statin therapy. 

## 2022-03-13 NOTE — Assessment & Plan Note (Signed)
-   We will continue PPI therapy and H2 blocker therapy.

## 2022-03-14 DIAGNOSIS — J189 Pneumonia, unspecified organism: Secondary | ICD-10-CM

## 2022-03-14 DIAGNOSIS — A419 Sepsis, unspecified organism: Secondary | ICD-10-CM | POA: Diagnosis present

## 2022-03-14 DIAGNOSIS — R6521 Severe sepsis with septic shock: Secondary | ICD-10-CM

## 2022-03-14 DIAGNOSIS — J9601 Acute respiratory failure with hypoxia: Secondary | ICD-10-CM

## 2022-03-14 LAB — BASIC METABOLIC PANEL
Anion gap: 4 — ABNORMAL LOW (ref 5–15)
BUN: 13 mg/dL (ref 8–23)
CO2: 24 mmol/L (ref 22–32)
Calcium: 7.9 mg/dL — ABNORMAL LOW (ref 8.9–10.3)
Chloride: 112 mmol/L — ABNORMAL HIGH (ref 98–111)
Creatinine, Ser: 0.5 mg/dL (ref 0.44–1.00)
GFR, Estimated: >60 mL/min (ref >60)
Glucose, Bld: 154 mg/dL — ABNORMAL HIGH (ref 70–99)
Potassium: 3.6 mmol/L (ref 3.5–5.1)
Sodium: 140 mmol/L (ref 135–145)

## 2022-03-14 LAB — CBC
HCT: 28.2 % — ABNORMAL LOW (ref 36.0–46.0)
Hemoglobin: 8.8 g/dL — ABNORMAL LOW (ref 12.0–15.0)
MCH: 28.3 pg (ref 26.0–34.0)
MCHC: 31.2 g/dL (ref 30.0–36.0)
MCV: 90.7 fL (ref 80.0–100.0)
Platelets: 325 10*3/uL (ref 150–400)
RBC: 3.11 MIL/uL — ABNORMAL LOW (ref 3.87–5.11)
RDW: 18.2 % — ABNORMAL HIGH (ref 11.5–15.5)
WBC: 20.2 10*3/uL — ABNORMAL HIGH (ref 4.0–10.5)
nRBC: 0 % (ref 0.0–0.2)

## 2022-03-14 LAB — PROTIME-INR
INR: 1.3 — ABNORMAL HIGH (ref 0.8–1.2)
Prothrombin Time: 15.6 seconds — ABNORMAL HIGH (ref 11.4–15.2)

## 2022-03-14 LAB — CORTISOL-AM, BLOOD: Cortisol - AM: 26.1 ug/dL — ABNORMAL HIGH (ref 6.7–22.6)

## 2022-03-14 LAB — MRSA NEXT GEN BY PCR, NASAL: MRSA by PCR Next Gen: NOT DETECTED

## 2022-03-14 LAB — PROCALCITONIN: Procalcitonin: 0.11 ng/mL

## 2022-03-14 LAB — LACTIC ACID, PLASMA: Lactic Acid, Venous: 0.7 mmol/L (ref 0.5–1.9)

## 2022-03-14 IMAGING — CT CT HEAD W/O CM
4 of 5 series · 17 of 47 positions shown, 19 images · non-contrast
Comparison: None.

CLINICAL DATA: Altered mental status

EXAM:
CT HEAD WITHOUT CONTRAST
TECHNIQUE: Contiguous axial images were obtained from the base of the skull
through the vertex without intravenous contrast.

[Series 3: head bone · axial · 0.40mm/px · z∈[-201,-143]mm · 4 of 84 slices shown]
[im 9/84  bone]
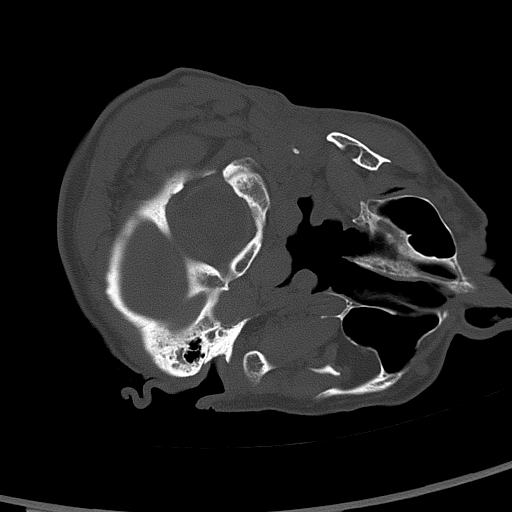
[im 17/84  bone]
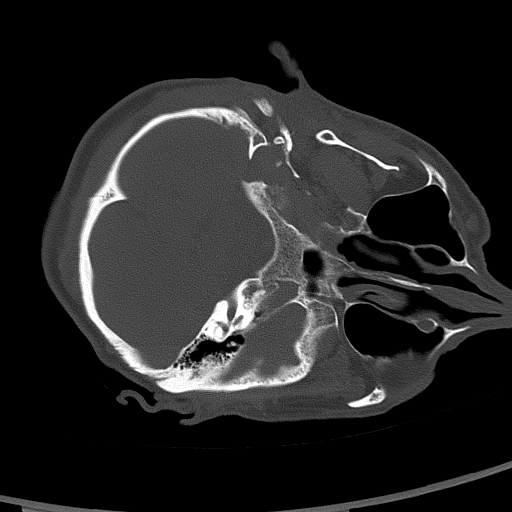
[im 25/84  bone]
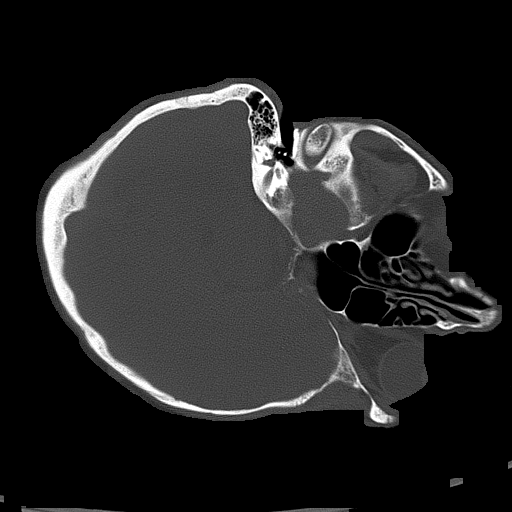
[im 38/84  bone]
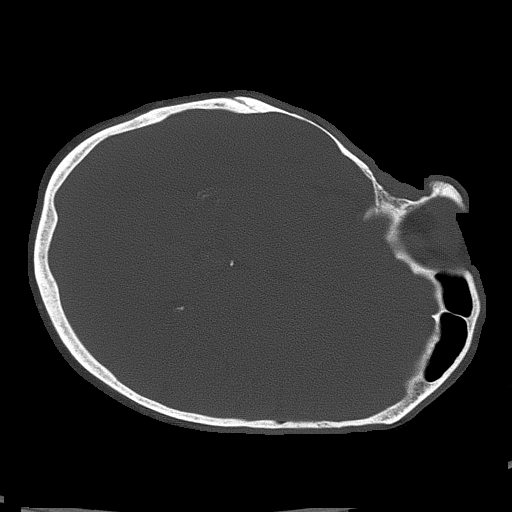

[Series 4: head without · axial · non-contrast · 0.40mm/px · z∈[-197,-77]mm · 7 of 34 slices shown, 9 images]
[im 5/34  brain]
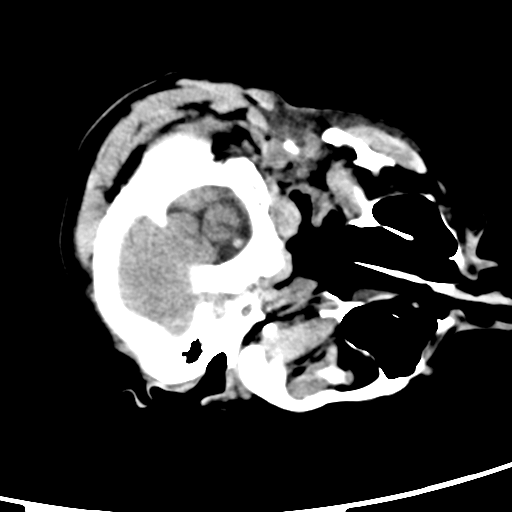
[im 5/34  bone]
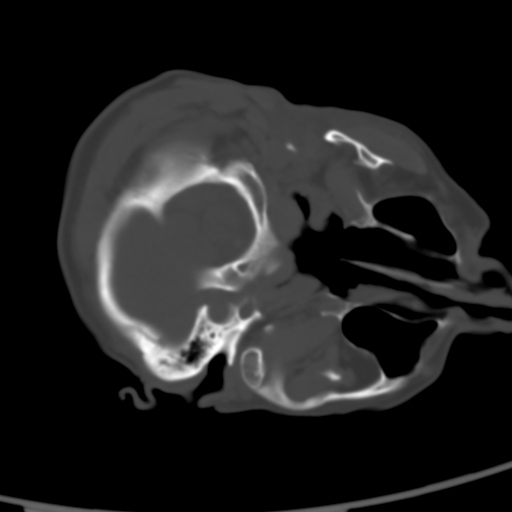
[im 9/34  brain]
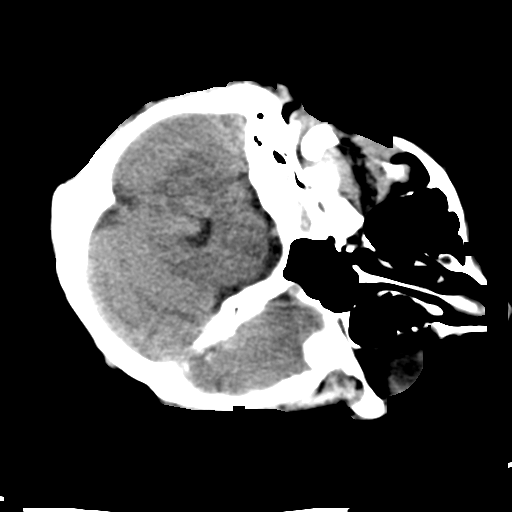
[im 13/34  brain]
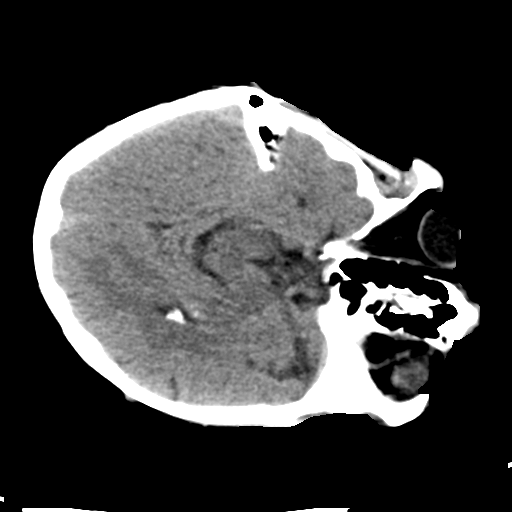
[im 17/34  brain]
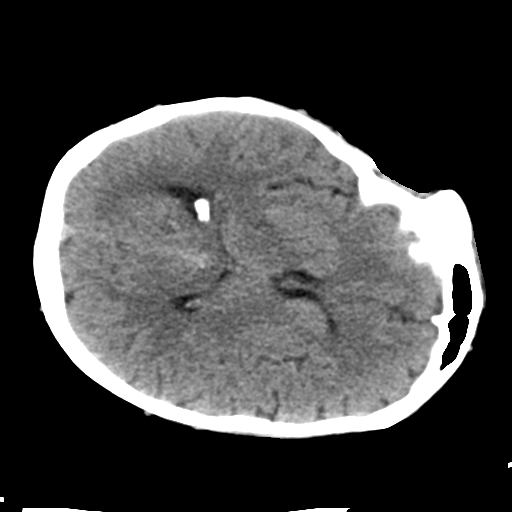
[im 21/34  brain]
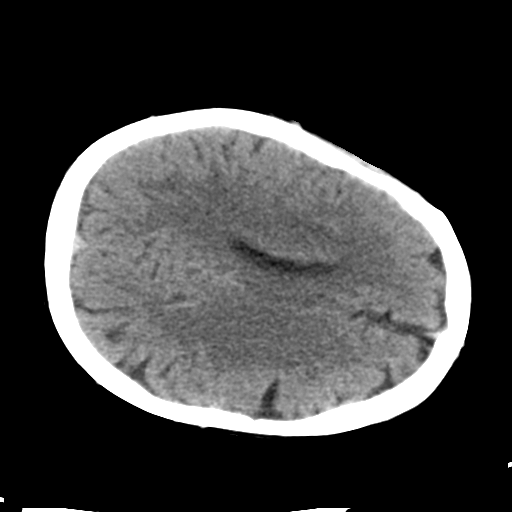
[im 21/34  bone]
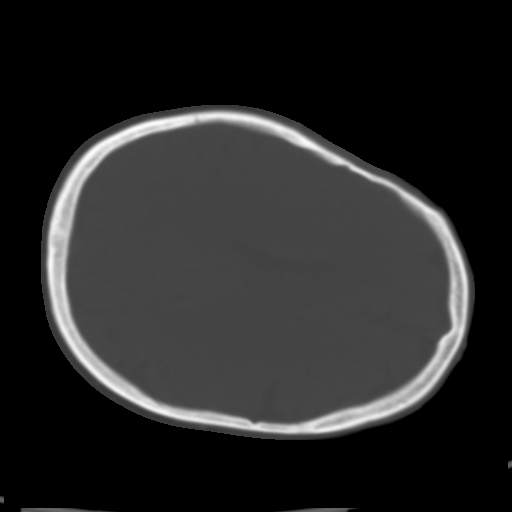
[im 25/34  brain]
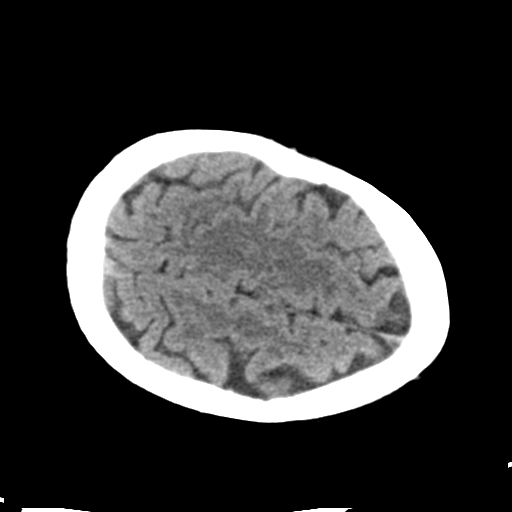
[im 29/34  brain]
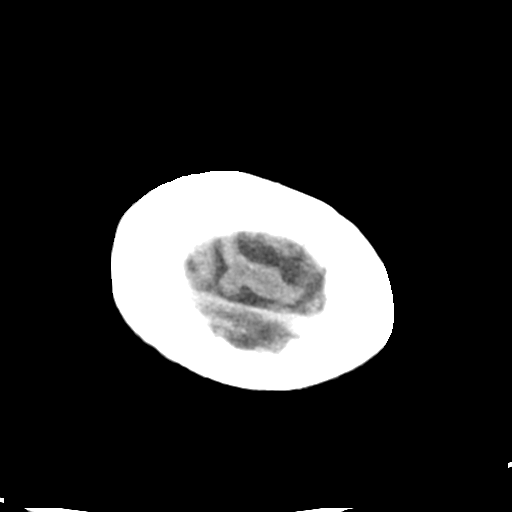

[Series 5: head without sag · coronal · non-contrast · 0.36mm/px · 3 of 60 slices shown]
[im 15/60  brain]
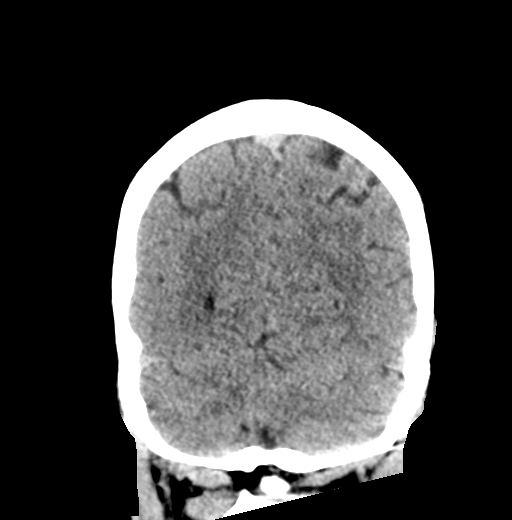
[im 30/60  brain]
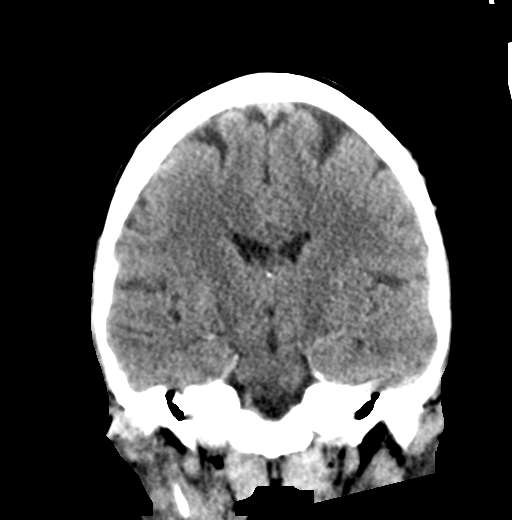
[im 45/60  brain]
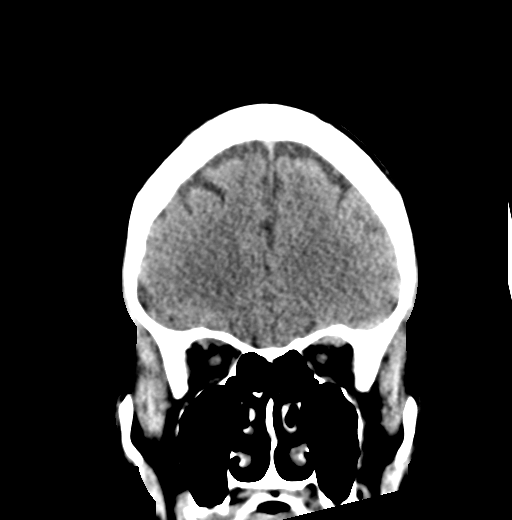

[Series 6: sag · sagittal · 0.35mm/px · 3 of 46 slices shown]
[im 19/46  brain]
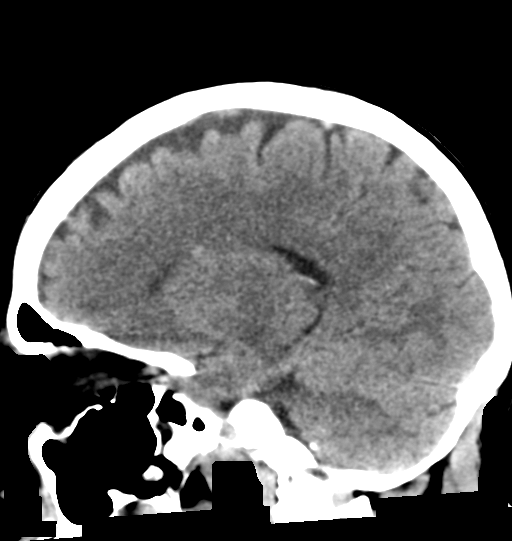
[im 23/46  brain]
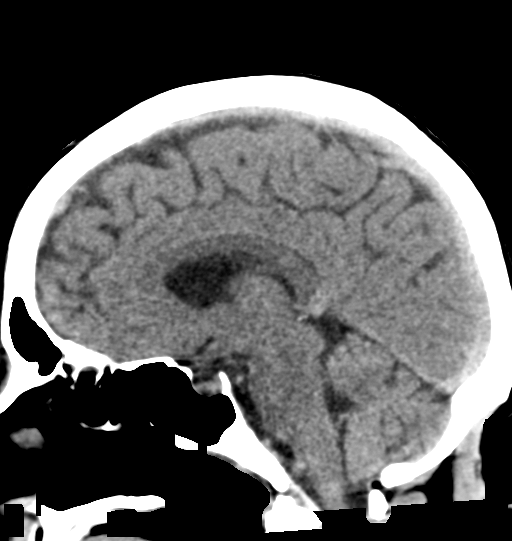
[im 28/46  brain]
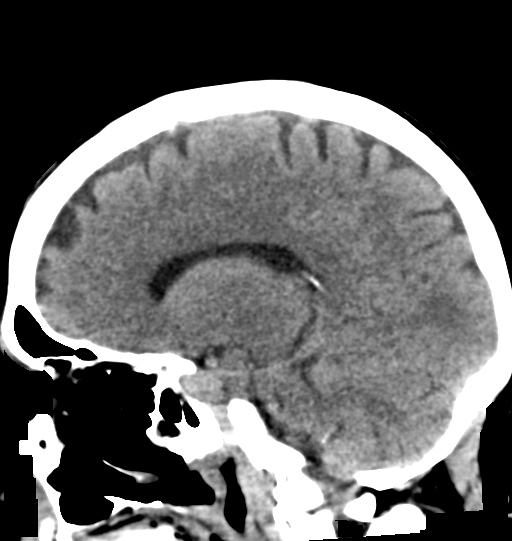

[17 of 47 positions shown; findings below may reference images not displayed]

FINDINGS: Brain: Normal anatomic configuration. Parenchymal volume loss is
commensurate with the patient's age. Mild periventricular white
matter changes are present likely reflecting the sequela of small
vessel ischemia. There are punctate left frontal periventricular
calcifications which may reflect the sequela of remote infection or
inflammation. No abnormal intra or extra-axial mass lesion or fluid
collection. No abnormal mass effect or midline shift. No evidence of
acute intracranial hemorrhage or infarct. Ventricular size is
normal. Cerebellum unremarkable.

Vascular: No asymmetric hyperdense vasculature at the skull base.

Skull: Intact

Sinuses/Orbits: Paranasal sinuses are clear. Orbits are
unremarkable.

Other: Mastoid air cells and middle ear cavities are clear.
IMPRESSION: No acute intracranial hemorrhage or infarct.

Mild senescent change.

## 2022-03-14 MED ORDER — MUPIROCIN 2 % EX OINT
1.0000 | TOPICAL_OINTMENT | Freq: Two times a day (BID) | CUTANEOUS | Status: DC
  Filled 2022-03-14: qty 22

## 2022-03-14 MED ORDER — ALPRAZOLAM 0.25 MG PO TABS
0.2500 mg | ORAL_TABLET | Freq: Once | ORAL | Status: AC
  Administered 2022-03-14: 0.25 mg via ORAL
  Filled 2022-03-14: qty 1

## 2022-03-14 MED ORDER — SODIUM CHLORIDE 0.9 % IV BOLUS
1000.0000 mL | Freq: Once | INTRAVENOUS | Status: AC
Start: 2022-03-14 — End: 2022-03-14
  Administered 2022-03-14: 1000 mL via INTRAVENOUS

## 2022-03-14 MED ORDER — NOREPINEPHRINE 4 MG/250ML-% IV SOLN
INTRAVENOUS | Status: AC
  Administered 2022-03-14: 4 ug/min via INTRAVENOUS
  Filled 2022-03-14: qty 250

## 2022-03-14 MED ORDER — CHLORHEXIDINE GLUCONATE CLOTH 2 % EX PADS
6.0000 | MEDICATED_PAD | Freq: Every day | CUTANEOUS | Status: DC
  Administered 2022-03-14: 6 via TOPICAL

## 2022-03-14 MED ORDER — NOREPINEPHRINE 4 MG/250ML-% IV SOLN: 2.0000 ug/min | INTRAVENOUS | Status: DC

## 2022-03-14 MED ORDER — CHLORHEXIDINE GLUCONATE CLOTH 2 % EX PADS
6.0000 | MEDICATED_PAD | Freq: Every day | CUTANEOUS | Status: DC
  Administered 2022-03-14 – 2022-03-15 (×2): 6 via TOPICAL

## 2022-03-14 MED ORDER — SODIUM CHLORIDE 0.9 % IV SOLN
1.5000 g | Freq: Four times a day (QID) | INTRAVENOUS | Status: DC
  Administered 2022-03-14 – 2022-03-16 (×8): 1.5 g via INTRAVENOUS
  Filled 2022-03-14: qty 1.5
  Filled 2022-03-14 (×9): qty 4

## 2022-03-14 MED ORDER — POLYETHYLENE GLYCOL 3350 17 G PO PACK: 17.0000 g | PACK | Freq: Every day | ORAL | Status: DC | PRN

## 2022-03-14 MED ORDER — SODIUM CHLORIDE 0.9 % IV SOLN
250.0000 mL | INTRAVENOUS | Status: DC
  Administered 2022-03-15: 250 mL via INTRAVENOUS

## 2022-03-14 MED ORDER — BOOST PLUS PO LIQD
237.0000 mL | Freq: Three times a day (TID) | ORAL | Status: DC
  Administered 2022-03-14 – 2022-03-15 (×5): 237 mL via ORAL
  Filled 2022-03-14 (×7): qty 237

## 2022-03-14 NOTE — Progress Notes (Signed)
eLink Physician-Brief Progress Note Patient Name: Tracy Park DOB: July 09, 1956 MRN: [id number]   Date of Service  03/14/2022  HPI/Events of Note  Multiple issues: 1. Patient states that she wants to be a full code. 2. Anxiety.  eICU Interventions  Plan: Will make the patient a full code. Xanax 0.25 mg PO X 1 now.      Intervention Category Major Interventions: End of life / care limitation discussion;Delirium, psychosis, severe agitation - evaluation and management  Lakaisha Danish Eugene 03/14/2022, 9:34 PM

## 2022-03-14 NOTE — Progress Notes (Signed)
An USGPIV (ultrasound guided PIV) has been placed for short-term vasopressor infusion. A correctly placed ivWatch must be used when administering Vasopressors. Should this treatment be needed beyond 72 hours, central line access should be obtained.  It will be the responsibility of the bedside nurse to follow best practice to prevent extravasations.   ?

## 2022-03-14 NOTE — ED Notes (Signed)
Upon initial arrival pt was alert and oriented x4. Was able to get up on her own and use the bedside commode.  At 1 am pts SBP 60s, was repeated x3. IVF bolus was started. EDP Pearline Cables at bedside verbal order for levophed, which was started at 4. Pt BP improving SBP >90. Awaiting Mansy to call back.

## 2022-03-14 NOTE — Progress Notes (Signed)
On admission assessment patient was complaining of severe pain to right upper arm. Norepi IV gtt was running through this PIV. Medication stopped, flushed IV and saline locked, assessed the arm (warm, pink, dry, painful to touch), notified charge nurse and placed STAT IV team consult for possible vasopressor infiltration.

## 2022-03-14 NOTE — ED Provider Notes (Signed)
1:13 AM Called to bedside as pt hypotensive, reduced responsiveness.  Systolic blood pressure 61Q to 80s, MAP less than 60.  Pt with reduced responsiveness. u/a to reach family at number supplied. Pt is DNR/DNI, will start peripheral levophed until able to speak with family at number listed below; VM left. Unable to reach admitting team.  Will have nursing re-page admitting team.   Levo to start peripherally, hemodynamics improved, pt more responsive. Continue IVF. Hypotension likely 2/2 septic shock.  Will defer further management to admitter   Miller,Monica Daughter     [phone]      Nursing was able to speak with admitting team, pt will transfer to ICU.   7:35 AM Received call back from daughter, agreeable to vasopressors.  Updated on patient's status.  She will call floor team for further information.   CRITICAL CARE Performed by: Jeanell Sparrow   Total critical care time: 32 minutes  Critical care time was exclusive of separately billable procedures and treating other patients.  Critical care was necessary to treat or prevent imminent or life-threatening deterioration.  Critical care was time spent personally by me on the following activities: development of treatment plan with patient and/or surrogate as well as nursing, discussions with consultants, evaluation of patient's response to treatment, examination of patient, obtaining history from patient or surrogate, ordering and performing treatments and interventions, ordering and review of laboratory studies, ordering and review of radiographic studies, pulse oximetry and re-evaluation of patient's condition. Septic shock, vasopressor's required    Jeanell Sparrow, DO 03/14/22 1102

## 2022-03-14 NOTE — Progress Notes (Signed)
Please see new pt password.  Per pt, only give pt information to those who can provide it.

## 2022-03-14 NOTE — Progress Notes (Addendum)
Critical care note:  Date of note: 03/14/2022  Subjective: The patient was admitted for new sepsis due to pneumonia.  She later on became altered mental status and decreased responsiveness.  Blood pressure as low as 68/47 with a MAP of 54 and respiratory rate of 31 then BP was 61/43 with a MAP of 51.  She was immediately started on IV normal saline bolus.  No chest pain or worsening dyspnea or cough.  No paresthesias or focal muscle weakness.  No fever or chills.  No nausea or vomiting or abdominal pain.  Objective: Physical exam: Generally: Acutely ill elderly Caucasian female in mild respiratory distress with conversational dyspnea.  She was fairly somnolent but arousable with improvement of blood pressure on pressor therapy. Vital signs: As above respiratory rate was 24-31, heart rate was 86 and pulse oximetry 97% on 2 L of O2 by nasal cannula.  Temperature has come down to 97.5. Head - atraumatic, normocephalic.  Pupils - equal, round and reactive to light and accommodation. Extraocular movements are intact. No scleral icterus.  Oropharynx - moist mucous membranes and tongue. No pharyngeal erythema or exudate.  Neck - supple. No JVD. Carotid pulses 2+ bilaterally. No carotid bruits. No palpable thyromegaly or lymphadenopathy. Cardiovascular - regular rate and rhythm. Normal S1 and S2. No murmurs, gallops or rubs.  Lungs -left-sided mid lung and upper lung zone crackles with diminished breath sounds. Abdomen - soft and nontender. Positive bowel sounds. No palpable organomegaly or masses.  Extremities - no pitting edema, clubbing or cyanosis.  Neuro - grossly non-focal. Skin - no rashes. Breast, pelvic and rectal - deferred.  Assessment/plan:  *Septic shock due to pneumonia with associated acute hypoxic respiratory failure. - The patient will be admitted to an ICU bed. - We will continue IV pressor therapy with Levophed and titrate for MAP more than 65. - We will continue antibiotic therapy  with IV Rocephin and Zithromax. - Mucolytic therapy and bronchodilator therapy will be provided. - We will continue hydration with IV normal saline.  Hypothyroidism (acquired) - We will continue Synthroid.   Anxiety and depression - We will continue BuSpar, Valium and Seroquel.   Tobacco abuse - I counseled her for smoking cessation and she will receive further counseling here.   History of lung cancer - The patient is status post left upper lobe lobectomy.   Dyslipidemia - We will continue statin therapy.   GERD (gastroesophageal reflux disease) - We will continue PPI therapy and H2 blocker therapy.  The case was discussed with Dr. Ander Slade who accepted the patient for transfer to the ICU.  Authorized and performed by: Eugenie Norrie, MD Total critical care time: Approximately 30 minutes. Due to a high probability of clinically significant, life-threatening deterioration, the patient required my highest level of preparedness to intervene emergently and I personally spent this critical care time directly and personally managing the patient.  This critical care time included obtaining a history, examining the patient, pulse oximetry, ordering and review of studies, arranging urgent treatment with development of management plan, evaluation of patient's response to treatment, frequent reassessment, and discussions with other providers. This critical care time was performed to assess and manage the high probability of imminent, life-threatening deterioration that could result in multiorgan failure.  It was exclusive of separately billable procedures and treating other patients and teaching time.

## 2022-03-14 NOTE — Consult Note (Signed)
NAME:  Tracy Park, MRN:  [id number], DOB:  1955/09/26, LOS: 1 ADMISSION DATE:  03/13/2022, CONSULTATION DATE: 03/14/2022 REFERRING MD: Dr. Sidney Ace, CHIEF COMPLAINT: Sepsis  History of Present Illness:  Asked to see patient for sepsis Presented to the hospital with shortness of breath, fever, chills for about a day Cough productive of clear phlegm Was seen recently in the emergency department for similar symptoms  Was been treated for pneumonia, sepsis Developed altered mentation with low blood pressures Started on pressors  No nausea, no vomiting, no abdominal pain, there is urinary urgency and frequency  History of left upper lobectomy for lung cancer, history of bipolar disorder, hypothyroidism, sleep apnea, chronic respiratory failure on 2 L of oxygen An active smoker  Pertinent  Medical History   Past Medical History:  Diagnosis Date   Anginal pain ([place])    Anxiety    Bipolar disorder ([place])    Cancer ([place])    COPD (chronic obstructive pulmonary disease) ([place])    Dyspnea    Family history of adverse reaction to anesthesia    History of kidney stones    Hydroureteronephrosis 08/16/2021   Hypothyroidism    Lung cancer ([place])    Myocardial infarction ([place])    PTSD (post-traumatic stress disorder)    Sleep apnea    Thyroid disease    Significant Hospital Events: Including procedures, antibiotic start and stop dates in addition to other pertinent events   7/5 CT scan of the chest shows left upper lobe infiltrative process, slightly improved from previous  Interim History / Subjective:  Levo weaned off, feeling a bit better.  Objective   Blood pressure (!) 120/53, pulse 95, temperature (!) 100.6 F (38.1 C), temperature source Oral, resp. rate 14, height 5\' 3"  (1.6 m), weight 44.9 kg, SpO2 95 %.        Intake/Output Summary (Last 24 hours) at 03/14/2022 1223 Last data filed at 03/14/2022 0900 Gross per 24 hour  Intake 3566.41 ml  Output --  Net 3566.41 ml   Filed  Weights   03/13/22 2029 03/14/22 0547  Weight: 45.4 kg 44.9 kg    Examination: General: chronically ill appearing, frail HENT: Dry MM Lungs: Decreased air movement bilaterally, normal WOB Cardiovascular: RRR Abdomen: Bowel sounds appreciated Extremities: No edema, no clubbing Neuro: Attentive, follows commands, grossly nonfocal  CTA Chest was reviewed by me with somewhat improved LUL opacity, emphysema  Resolved Hospital Problem list     Assessment & Plan:  Hypovolemic and septic shock suspected due to postobstructive pneumonia, shock resolved LUL opacity could reflect pneumonia with typical bacterial pathogen or resistant organism, malignancy with postobstructive infx component, organizing pneumonia.  -sputum culture -Continue unasyn, augmentin for 2 week total course at discharge, follow cultures and narrow as able -If she deteriorates again/is failing to improve before discharge could entertain bronch/BAL/rp ebus guided tblb but would lean toward repeat CT Chest as outpatient with pulmonary clinic follow up. Follows with Dr. [name] but she may wish to set up appointment in out clinic  Altered mental status likely secondary to sepsis, resolved with mgmt sepsis -CTM -delirium precautions  Chronic hypoxic respiratory failure -Wean O2 for saturation 88-92% -Bronchodilators  History of lung cancer -Post lobectomy, XRT  Hypothyroidism -Continue Synthroid  Best Practice (right click and "Reselect all SmartList Selections" daily)   Diet/type: Regular consistency (see orders) DVT prophylaxis: LMWH GI prophylaxis: N/A Lines: N/A Foley:  N/A Code Status:  DNR Last date of multidisciplinary goals of care discussion [pt updated at  bedside, transfer to med-surg later today if [place]

## 2022-03-14 NOTE — Progress Notes (Addendum)
Initial Nutrition Assessment  DOCUMENTATION CODES:   Severe malnutrition in context of chronic illness, Underweight  INTERVENTION:  - will order Boost Plus TID, each supplement provides 360 kcal and 14 grams of protein.  - will enter High Calorie, High Protein Nutrition Therapy handout in AVS.   NUTRITION DIAGNOSIS:   Severe Malnutrition related to chronic illness (COPD, hx of lung cancer) as evidenced by severe fat depletion, severe muscle depletion.  GOAL:   Patient will meet greater than or equal to 90% of their needs  MONITOR:   PO intake, Supplement acceptance, Labs, Weight trends  REASON FOR ASSESSMENT:   Malnutrition Screening Tool  ASSESSMENT:   66 y.o. female with medical history of bipolar disorder, hypothyroidism, sleep apnea, lung cancer (in remission, per patient report), COPD, MI, bipolar disorder, anxiety, PTSD, and tobacco abuse (continues to smoke 5 cigarettes/day). She presented to the ED due to acute onset of fever, chills, worsening dyspnea, and cough productive of clear sputum. In the ED she reported experiencing urinary frequency and urgency leading to intermittent incontinence. She was admitted for sepsis d/t PNA.  Patient was seen by a RD on 02/28/22. Patient discussed with RN during rounds this AM.  Patient laying in bed with no visitors present at the time of RD visit. She shares that she has 2 adult daughters who live in the area. She lives alone since the passing of her husband. Patient shares that ambulation is difficult and she is often unsteady. The walker she has is too wide for the doorways in her mobile home.   She shares that she has always been slender and that even as a child those at school would ask her mom if she (the patient) ate.   Weight today is 99 lb, weight on 08/16/21 was 100 lb, and weight on 05/03/21 was 96 lb.   Patient shares that she lost a significant amount of weight when she was undergoing treatment for lung cancer and has  been unable to re-gain the weight. She is interested in gaining some weight as she is distressed by being able to see her ribs.   She drinks chocolate Boost at home and is interested in having them here. Informed patient of plan to put handout in AVS and she is very receptive to this.     Labs reviewed; Cl: 112 mmol/l, Ca: 7.9 mg/dl.  Medications reviewed; 1000 units cholecalciferol/day, 40 mg oral pepcid/day, 137 mcg oral synthroid/day, 400 mg mag-ox/day, 1 tablet multivitamin with minerals/day, 40 mg oral pepcid/day.  IVF; NS-20 mEq IV KCl @ 100 ml/hr.     NUTRITION - FOCUSED PHYSICAL EXAM:  Flowsheet Row Most Recent Value  Orbital Region Moderate depletion  Upper Arm Region Severe depletion  Thoracic and Lumbar Region Unable to assess  Buccal Region Severe depletion  Temple Region Moderate depletion  Clavicle Bone Region Severe depletion  Clavicle and Acromion Bone Region Severe depletion  Scapular Bone Region Severe depletion  Dorsal Hand Moderate depletion  Patellar Region Severe depletion  Anterior Thigh Region Severe depletion  Posterior Calf Region Severe depletion  Edema (RD Assessment) None  Hair Reviewed  Eyes Reviewed  Mouth Reviewed  Skin Reviewed  Nails Reviewed       Diet Order:   Diet Order             Diet regular Room service appropriate? Yes; Fluid consistency: Thin  Diet effective now  EDUCATION NEEDS:   Education needs have been addressed  Skin:  Skin Assessment: Reviewed RN Assessment  Last BM:  PTA/unknown  Height:   Ht Readings from Last 1 Encounters:  03/14/22 5\' 3"  (1.6 m)    Weight:   Wt Readings from Last 1 Encounters:  03/14/22 44.9 kg     BMI:  Body mass index is 17.53 kg/m.  Estimated Nutritional Needs:  Kcal:  1800-2100 kcal Protein:  90-105 grams Fluid:  >/= 1.8 L/day     Jarome Matin, MS, RD, LDN, CNSC Registered Dietitian II Inpatient Clinical Nutrition RD pager # and  on-call/weekend pager # available in [facility]

## 2022-03-14 NOTE — ED Notes (Signed)
Paged admitting provider. Awaiting call back. EDP S Gray at bedside d/t low BP and decreased GCS

## 2022-03-14 NOTE — ED Notes (Signed)
Mansy at bedside

## 2022-03-14 NOTE — ED Notes (Signed)
Spoke to Dr. Sidney Ace informed pt decline. Per hospitalitis pt to be seen by critical care

## 2022-03-14 NOTE — ED Notes (Signed)
Critical care at bedside  

## 2022-03-14 NOTE — Progress Notes (Signed)
This nurse spoke with the patient and the patient mentioned she changed her code status from DNR to a full code. There are no notes in the patient's charts in these regards. Patient also complains of feeling anxious due to family concerns. Called E-link in reference to code status and request for antianxiety medication.

## 2022-03-14 NOTE — Consult Note (Signed)
NAME:  Tracy Park, MRN:  [id number], DOB:  12/11/1955, LOS: 1 ADMISSION DATE:  03/13/2022, CONSULTATION DATE: 03/14/2022 REFERRING MD: Dr. Sidney Ace, CHIEF COMPLAINT: Sepsis  History of Present Illness:  Asked to see patient for sepsis Presented to the hospital with shortness of breath, fever, chills for about a day Cough productive of clear phlegm Was seen recently in the emergency department for similar symptoms  Was been treated for pneumonia, sepsis Developed altered mentation with low blood pressures Started on pressors  No nausea, no vomiting, no abdominal pain, there is urinary urgency and frequency  History of left upper lobectomy for lung cancer, history of bipolar disorder, hypothyroidism, sleep apnea, chronic respiratory failure on 2 L of oxygen An active smoker  Pertinent  Medical History   Past Medical History:  Diagnosis Date   Anginal pain ([place])    Anxiety    Bipolar disorder ([place])    Cancer ([place])    COPD (chronic obstructive pulmonary disease) ([place])    Dyspnea    Family history of adverse reaction to anesthesia    History of kidney stones    Hydroureteronephrosis 08/16/2021   Hypothyroidism    Lung cancer ([place])    Myocardial infarction ([place])    PTSD (post-traumatic stress disorder)    Sleep apnea    Thyroid disease    Significant Hospital Events: Including procedures, antibiotic start and stop dates in addition to other pertinent events   CT scan of the chest shows left upper lobe infiltrative process, slightly improved from previous  Interim History / Subjective:  Elderly, does not appear to be in extremities  Objective   Blood pressure (!) 88/53, pulse 88, temperature (!) 97.5 F (36.4 C), temperature source Oral, resp. rate (!) 21, weight 45.4 kg, SpO2 99 %.        Intake/Output Summary (Last 24 hours) at 03/14/2022 0217 Last data filed at 03/13/2022 2154 Gross per 24 hour  Intake 1200 ml  Output --  Net 1200 ml   Filed Weights   03/13/22 2029   Weight: 45.4 kg    Examination: General: Elderly, frail, lethargic HENT: Dry oral mucosa Lungs: Decreased air movement bilaterally with bibasal Rales Cardiovascular: S1-S2 appreciated Abdomen: Bowel sounds appreciated Extremities: No edema, no clubbing Neuro: Lethargic GU: Fair output  CT scan was reviewed by myself showing multilobar infiltrate in the left upper lobe  ABG noted Resolved Hospital Problem list     Assessment & Plan:  Sepsis Sepsis secondary to multilobar pneumonia -Continue cefepime and vancomycin -Follow cultures  Multilobar pneumonia with recent treatment 6/19-6/23 -Continue cefepime and vancomycin -De-escalate antibiotics based on cultures  Hypotension -Patient on pressors peripheral pressors -Titrate to MAP greater than 65  Altered mental status likely secondary to sepsis -Continue hemodynamic support  Acute on chronic respiratory failure -Continue oxygen supplementation -Bronchodilators  History of lung cancer -Post lobectomy Hypothyroidism -Continue Synthroid  Best Practice (right click and "Reselect all SmartList Selections" daily)   Diet/type: Regular consistency (see orders) DVT prophylaxis: LMWH GI prophylaxis: N/A Lines: N/A Foley:  N/A Code Status:  DNR Last date of multidisciplinary goals of care discussion [pending]  Labs   CBC: Recent Labs  Lab 03/11/22 1756 03/13/22 1852  WBC 16.0* 19.9*  NEUTROABS  --  16.1*  HGB 10.9* 10.8*  HCT 34.9* 34.2*  MCV 90.9 89.3  PLT 524* 435*    Basic Metabolic Panel: Recent Labs  Lab 03/11/22 1756 03/13/22 1852  NA 142 137  K 4.2 3.6  CL 105  103  CO2 29 26  GLUCOSE 116* 173*  BUN 16 17  CREATININE 0.56 0.67  CALCIUM 9.2 9.2   GFR: Estimated Creatinine Clearance: 51.6 mL/min (by C-G formula based on SCr of 0.67 mg/dL). Recent Labs  Lab 03/11/22 1756 03/13/22 1852 03/13/22 1920  WBC 16.0* 19.9*  --   LATICACIDVEN  --   --  1.5    Liver Function Tests: Recent  Labs  Lab 03/13/22 1852  AST 16  ALT 13  ALKPHOS 75  BILITOT 0.6  PROT 7.4  ALBUMIN 3.3*   No results for input(s): "LIPASE", "AMYLASE" in the last 168 hours. No results for input(s): "AMMONIA" in the last 168 hours.  ABG    Component Value Date/Time   PHART 7.410 05/03/2021 1029   PCO2ART 44.9 05/03/2021 1029   PO2ART 43 (L) 05/03/2021 1029   HCO3 29.0 (H) 03/13/2022 1900   TCO2 30 05/03/2021 1029   O2SAT 82.9 03/13/2022 1900     Coagulation Profile: No results for input(s): "INR", "PROTIME" in the last 168 hours.  Cardiac Enzymes: No results for input(s): "CKTOTAL", "CKMB", "CKMBINDEX", "TROPONINI" in the last 168 hours.  HbA1C: Hgb A1c MFr Bld  Date/Time Value Ref Range Status  08/20/2021 02:35 AM 5.9 (H) 4.8 - 5.6 % Final    Comment:    (NOTE) Pre diabetes:          5.7%-6.4%  Diabetes:              >6.4%  Glycemic control for   <7.0% adults with diabetes     CBG: No results for input(s): "GLUCAP" in the last 168 hours.  Review of Systems:   Shortness of breath  Past Medical History:  She,  has a past medical history of Anginal pain ([place]), Anxiety, Bipolar disorder ([place]), Cancer ([place]), COPD (chronic obstructive pulmonary disease) ([place]), Dyspnea, Family history of adverse reaction to anesthesia, History of kidney stones, Hydroureteronephrosis (08/16/2021), Hypothyroidism, Lung cancer ([place]), Myocardial infarction ([place]), PTSD (post-traumatic stress disorder), Sleep apnea, and Thyroid disease.   Surgical History:   Past Surgical History:  Procedure Laterality Date   ABDOMINAL HYSTERECTOMY     BACK SURGERY     CYSTOSCOPY W/ URETERAL STENT PLACEMENT Right 05/03/2021   Procedure: CYSTOSCOPY WITH RETROGRADE PYELOGRAM/URETERAL STENT PLACEMENT;  Surgeon: Ardis Hughs, MD;  Location: [facility];  Service: Urology;  Laterality: Right;   EYE SURGERY     kidney stent     thyroidectomy       Social History:   reports that she has been smoking cigarettes. She  has been smoking an average of .15 packs per day. She has never used smokeless tobacco. She reports that she does not currently use alcohol. She reports that she does not currently use drugs.   Family History:  Her Family history is unknown by patient.   Allergies Allergies  Allergen Reactions   Red Dye Hives and Itching   Oxycontin [Oxycodone Hcl]     Other reaction(s): Hallucinations   Prednisone Hives and Nausea And Vomiting   Aspirin Hives   Tape Rash    ONLY USE PAPER TAPE     Home Medications  Prior to Admission medications   Medication Sig Start Date End Date Taking? Authorizing Provider  acetaminophen (TYLENOL) 325 MG tablet Take 650 mg by mouth every 6 (six) hours as needed for moderate pain or headache.   Yes [provider]  busPIRone (BUSPAR) 5 MG tablet Take 5 mg by mouth 3 (three) times  daily.   Yes [provider]  Cholecalciferol 25 MCG (1000 UT) tablet Take 1,000 Units by mouth daily.   Yes [provider]  diazepam (VALIUM) 2 MG tablet Take 1 tablet (2 mg total) by mouth every 6 (six) hours as needed for muscle spasms. 03/11/22  Yes Lacretia Leigh, MD  famotidine (PEPCID) 40 MG tablet Take 40 mg by mouth daily. 02/14/20  Yes [provider]  ipratropium-albuterol (DUONEB) 0.5-2.5 (3) MG/3ML SOLN Take 3 mLs by nebulization every 6 (six) hours as needed (shortness of breath). 02/10/22  Yes [provider]  levothyroxine (SYNTHROID) 137 MCG tablet Take 137 mcg by mouth daily before breakfast. 03/14/20  Yes [provider]  Magnesium Oxide 400 MG CAPS Take 1 capsule (400 mg total) by mouth daily at 12 noon. Patient taking differently: Take 400 mg by mouth daily. 03/01/22  Yes Florencia Reasons, MD  meloxicam (MOBIC) 15 MG tablet Take 15 mg by mouth daily. 04/07/20  Yes [provider]  methocarbamol (ROBAXIN) 500 MG tablet Take 500 mg by mouth 2 (two) times daily. 02/10/20  Yes [provider]  Multiple Vitamins-Minerals  (CENTRUM WOMEN) TABS Take 1 tablet by mouth daily.   Yes [provider]  nitroGLYCERIN (NITROSTAT) 0.4 MG SL tablet Place 0.4 mg under the tongue every 5 (five) minutes as needed for chest pain.   Yes [provider]  omeprazole (PRILOSEC) 20 MG capsule Take 20 mg by mouth daily. Takes an additional capsule if needed for acid reflux   Yes [provider]  oxybutynin (DITROPAN) 5 MG tablet Take 5 mg by mouth 3 (three) times daily. 03/15/20  Yes [provider]  OXYGEN Inhale 2 L into the lungs daily as needed (for low oxygen).   Yes [provider]  Propylene Glycol (SYSTANE COMPLETE OP) Place 1 drop into both eyes daily as needed (dry eyes).   Yes [provider]  QUEtiapine (SEROQUEL) 300 MG tablet Take 300 mg by mouth at bedtime.   Yes [provider]  rosuvastatin (CRESTOR) 20 MG tablet Take 20 mg by mouth daily. 01/15/20  Yes [provider]  TRELEGY ELLIPTA 100-62.5-25 MCG/ACT AEPB Take 1 puff by mouth daily. 02/06/22  Yes [provider]  azithromycin (ZITHROMAX) 250 MG tablet Take 1 tablet (250 mg total) by mouth daily. Patient not taking: Reported on 03/13/2022 03/08/22   Orpah Greek, MD  cefdinir (OMNICEF) 300 MG capsule Take 1 capsule (300 mg total) by mouth 2 (two) times daily. Patient not taking: Reported on 03/13/2022 03/07/22   Orpah Greek, MD  HYDROcodone-acetaminophen (NORCO/VICODIN) 5-325 MG tablet Take 1 tablet by mouth every 4 (four) hours as needed for moderate pain. Patient not taking: Reported on 03/13/2022 03/03/22 03/03/23  Fransico Meadow, PA-C  lidocaine (LIDODERM) 5 % Place 1 patch onto the skin daily. Remove & Discard patch within 12 hours or as directed by MD Patient not taking: Reported on 03/13/2022 03/11/22   Lacretia Leigh, MD    The patient is critically ill with multiple organ systems failure and requires high complexity decision making for assessment and support, frequent  evaluation and titration of therapies, application of advanced monitoring technologies and extensive interpretation of multiple databases. Critical Care Time devoted to patient care services described in this note independent of APP/resident time (if applicable)  is 35 minutes.   Sherrilyn Rist MD [facility] Personal pager: See Amion If unanswered, please page CCM On-call: [phone]

## 2022-03-15 ENCOUNTER — Telehealth: Payer: Self-pay | Admitting: Internal Medicine

## 2022-03-15 DIAGNOSIS — A419 Sepsis, unspecified organism: Secondary | ICD-10-CM | POA: Diagnosis not present

## 2022-03-15 DIAGNOSIS — J189 Pneumonia, unspecified organism: Secondary | ICD-10-CM | POA: Diagnosis not present

## 2022-03-15 LAB — BASIC METABOLIC PANEL
Anion gap: 5 (ref 5–15)
BUN: 14 mg/dL (ref 8–23)
CO2: 25 mmol/L (ref 22–32)
Calcium: 8.3 mg/dL — ABNORMAL LOW (ref 8.9–10.3)
Chloride: 112 mmol/L — ABNORMAL HIGH (ref 98–111)
Creatinine, Ser: 0.47 mg/dL (ref 0.44–1.00)
GFR, Estimated: >60 mL/min (ref >60)
Glucose, Bld: 94 mg/dL (ref 70–99)
Potassium: 3.8 mmol/L (ref 3.5–5.1)
Sodium: 142 mmol/L (ref 135–145)

## 2022-03-15 LAB — CBC
HCT: 26.1 % — ABNORMAL LOW (ref 36.0–46.0)
Hemoglobin: 8 g/dL — ABNORMAL LOW (ref 12.0–15.0)
MCH: 27.8 pg (ref 26.0–34.0)
MCHC: 30.7 g/dL (ref 30.0–36.0)
MCV: 90.6 fL (ref 80.0–100.0)
Platelets: 250 10*3/uL (ref 150–400)
RBC: 2.88 MIL/uL — ABNORMAL LOW (ref 3.87–5.11)
RDW: 18.1 % — ABNORMAL HIGH (ref 11.5–15.5)
WBC: 11.6 10*3/uL — ABNORMAL HIGH (ref 4.0–10.5)
nRBC: 0 % (ref 0.0–0.2)

## 2022-03-15 IMAGING — US US ABDOMEN LIMITED
1 series · 14 of 25 positions shown · non-contrast
Comparison: None.

CLINICAL DATA: Right upper quadrant pain

EXAM:
ULTRASOUND ABDOMEN LIMITED RIGHT UPPER QUADRANT

[Series 1: us abdomen limited ruq (liver/gb) · 14 of 42 slices shown]
[im 1/42]
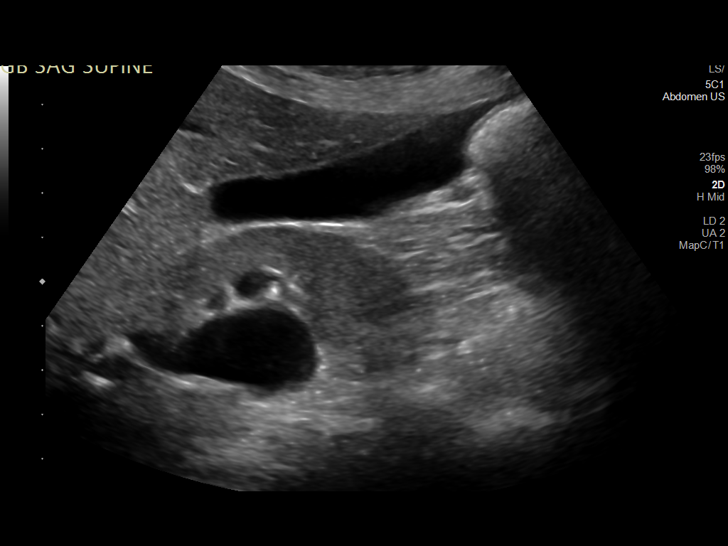
[im 4/42]
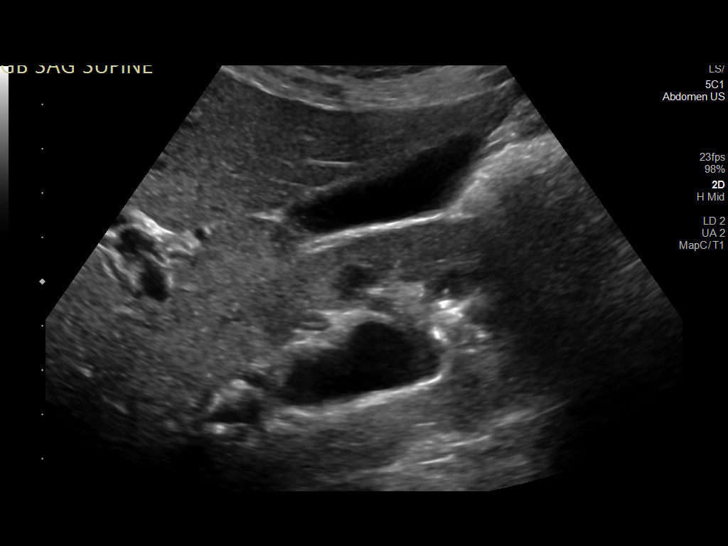
[im 7/42]
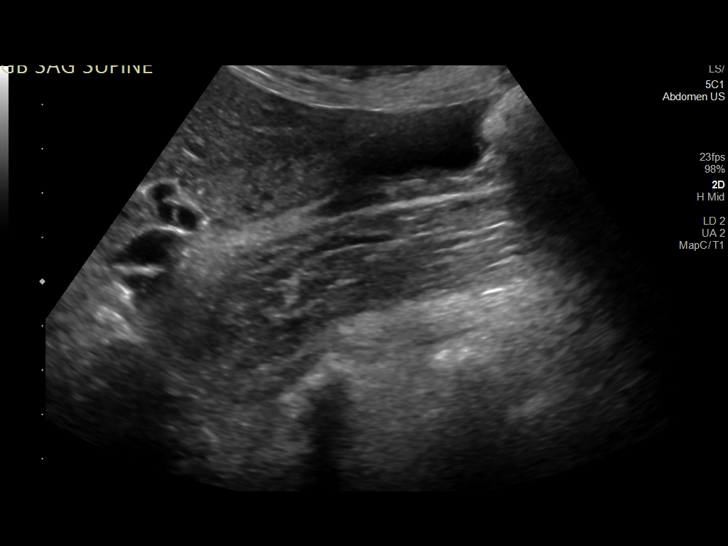
[im 11/42]
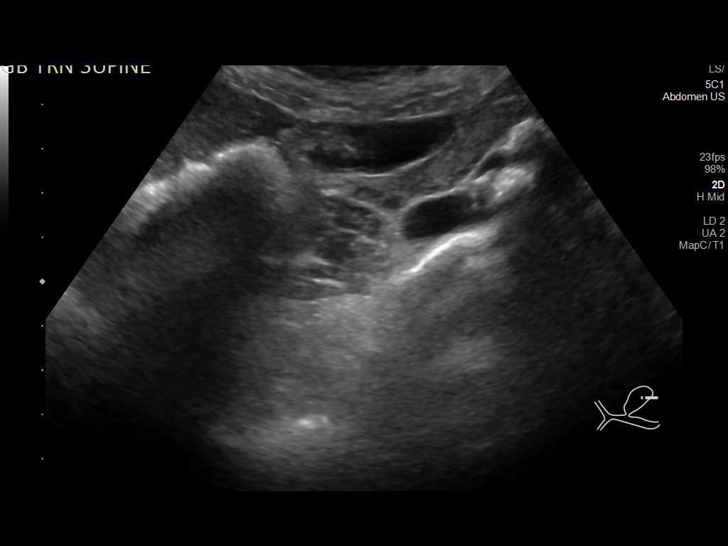
[im 14/42]
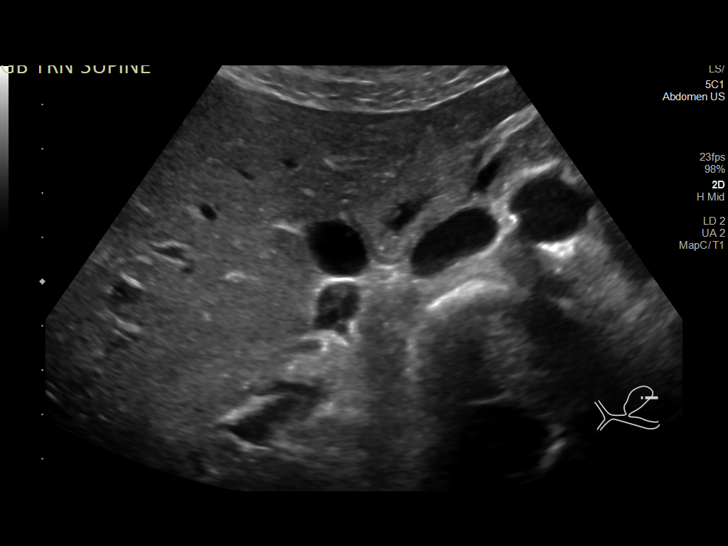
[im 16/42]
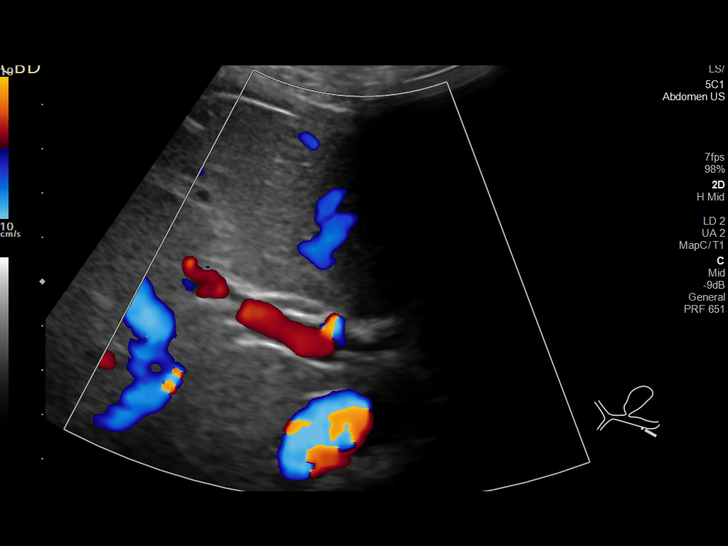
[im 19/42]
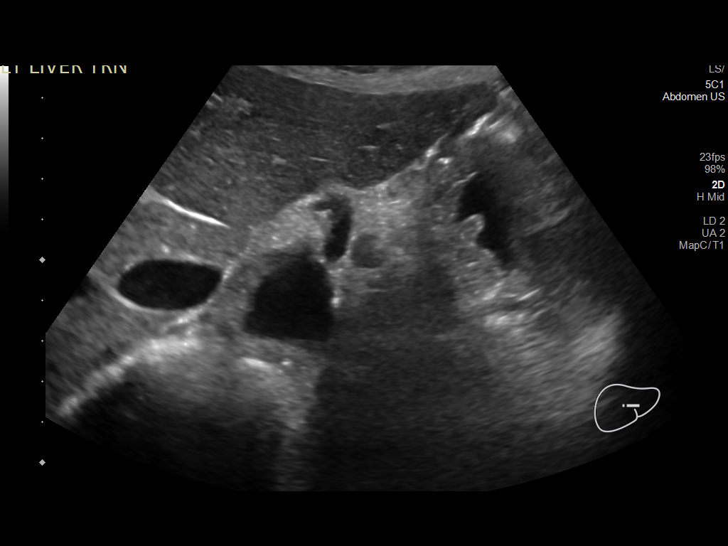
[im 23/42]
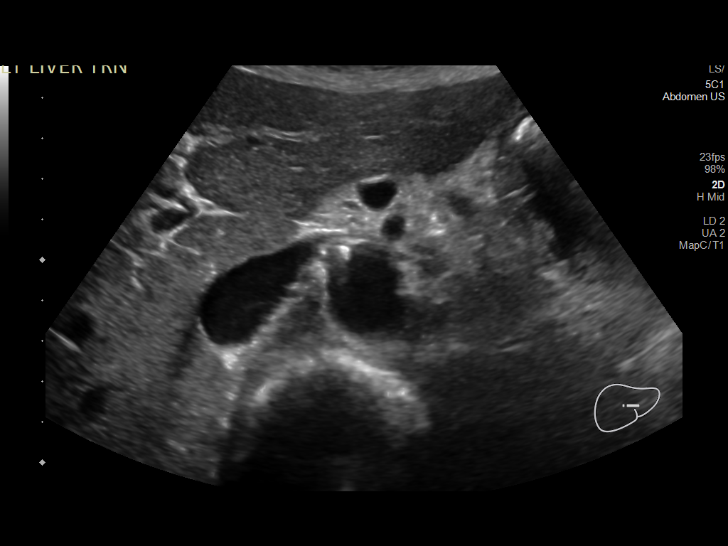
[im 26/42]
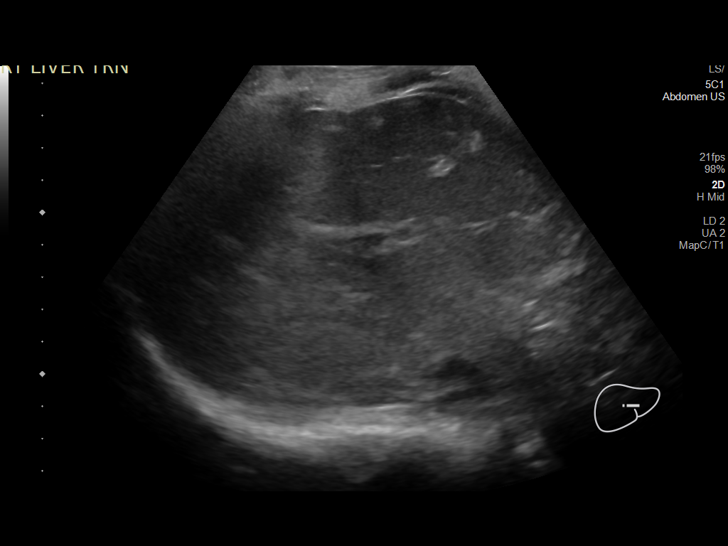
[im 28/42]
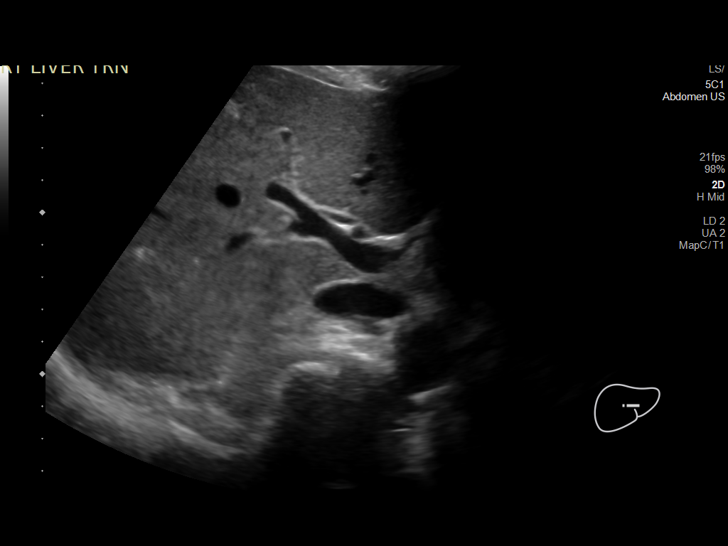
[im 31/42]
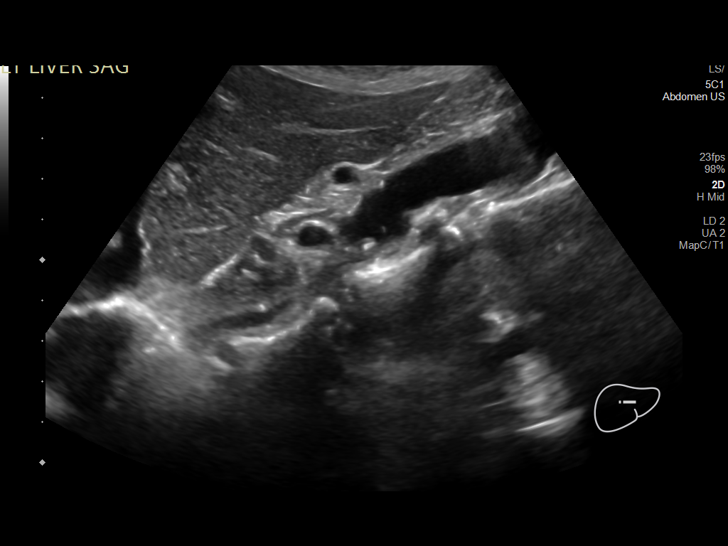
[im 35/42]
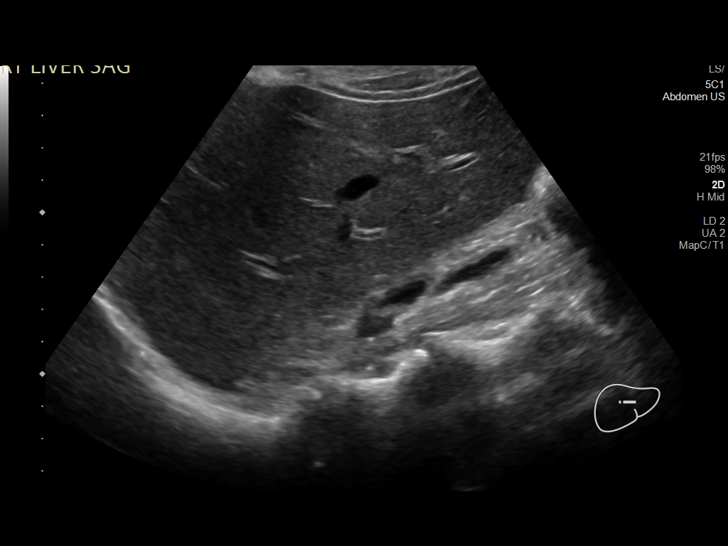
[im 38/42]
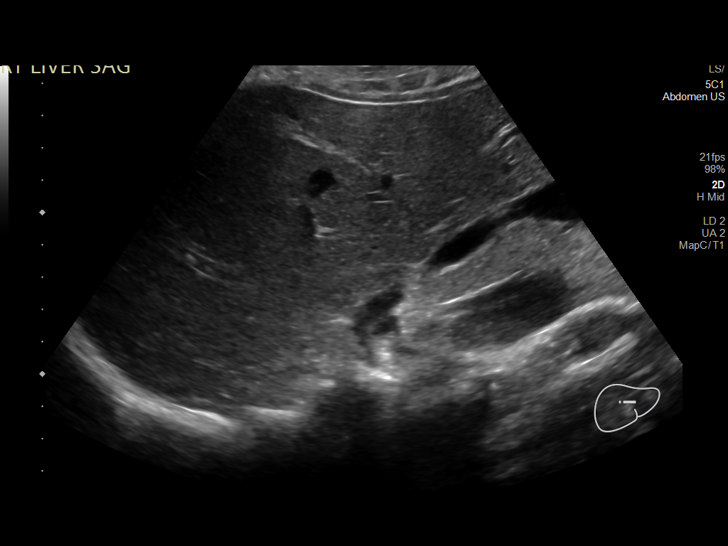
[im 42/42]
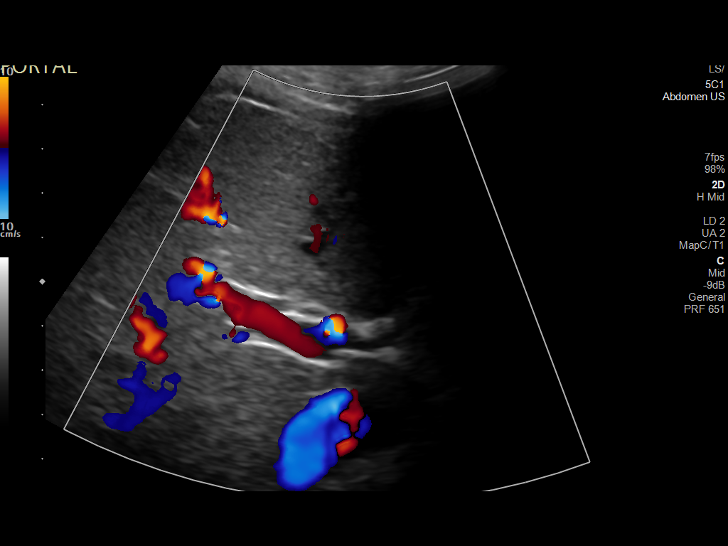

[14 of 25 positions shown; findings below may reference images not displayed]

FINDINGS: Gallbladder:

No gallstones or wall thickening visualized. No sonographic Murphy
sign noted by sonographer.

Common bile duct:

Diameter: 2.9 mm

Liver:

No focal lesion identified. Within normal limits in parenchymal
echogenicity. Portal vein is patent on color Doppler imaging with
normal direction of blood flow towards the liver.

Other: Incidental note is made of right-sided hydronephrosis.
IMPRESSION: Right-sided hydronephrosis.

Otherwise unremarkable right upper quadrant ultrasound.

## 2022-03-15 MED ORDER — ORAL CARE MOUTH RINSE: 15.0000 mL | OROMUCOSAL | Status: DC | PRN

## 2022-03-15 NOTE — Consult Note (Signed)
   NAME:  Tracy Park, MRN:  [id number], DOB:  1956-02-24, LOS: 2 ADMISSION DATE:  03/13/2022, CONSULTATION DATE: 03/14/2022 REFERRING MD: Dr. Sidney Ace, CHIEF COMPLAINT: Sepsis  History of Present Illness:  Asked to see patient for sepsis Presented to the hospital with shortness of breath, fever, chills for about a day Cough productive of clear phlegm Was seen recently in the emergency department for similar symptoms  Was been treated for pneumonia, sepsis Developed altered mentation with low blood pressures Started on pressors  No nausea, no vomiting, no abdominal pain, there is urinary urgency and frequency  History of left upper lobectomy for lung cancer, history of bipolar disorder, hypothyroidism, sleep apnea, chronic respiratory failure on 2 L of oxygen An active smoker  Pertinent  Medical History   Past Medical History:  Diagnosis Date   Anginal pain ([place])    Anxiety    Bipolar disorder ([place])    Cancer ([place])    COPD (chronic obstructive pulmonary disease) ([place])    Dyspnea    Family history of adverse reaction to anesthesia    History of kidney stones    Hydroureteronephrosis 08/16/2021   Hypothyroidism    Lung cancer ([place])    Myocardial infarction ([place])    PTSD (post-traumatic stress disorder)    Sleep apnea    Thyroid disease    Significant Hospital Events: Including procedures, antibiotic start and stop dates in addition to other pertinent events   7/5 CT scan of the chest shows left upper lobe infiltrative process, slightly improved from previous  Interim History / Subjective:  No overnight issues. Had a breathing treatment this morning.   Objective   Blood pressure (!) 155/78, pulse (!) 113, temperature 99 F (37.2 C), temperature source Axillary, resp. rate (!) 32, height 5\' 3"  (1.6 m), weight 48.6 kg, SpO2 90 %.        Intake/Output Summary (Last 24 hours) at 03/15/2022 0835 Last data filed at 03/15/2022 0800 Gross per 24 hour  Intake 1836.78 ml  Output  1450 ml  Net 386.78 ml   Filed Weights   03/13/22 2029 03/14/22 0547 03/15/22 0500  Weight: 45.4 kg 44.9 kg 48.6 kg    Examination: Elderly, chronically ill appearing Breath sounds diminished bilaterally without wheezes or crackles, no increased wob Tachycardic, regular,  No edema   Resolved Hospital Problem list   shock AMS Assessment & Plan:  Postobstructive pneumonia - clinically improved since shock is resolved - unable to produce sputum cultures - would continue treatment with unasyn total two weeks and transition to augmentin at discharge - I will arrange for follow up in our clinic per her request.   Chronic hypoxic respiratory failure -Wean O2 for saturation 88-92%. She already has home oxygen 2LNC which she uses prn -Bronchodilators  History of lung cancer -Post lobectomy, XRT  Hypothyroidism -Continue Synthroid  Updated transfer order for med surg.  I spent 35 minutes in total visit time for this patient, with more than 50% spent counseling/coordinating care.   PCCM will see as needed. Please call with questions.  Plan of care discussed with Dr. Verlon Au.   Lenice Llamas, MD [facility] [place] 03/15/2022 8:39 AM Pager: see AMION  If no response to pager, please call critical care on call (see AMION) until 7pm After 7:00 pm call Elink

## 2022-03-15 NOTE — Telephone Encounter (Signed)
Scheduled pt for HFU with Derl Barrow on 7/21.

## 2022-03-15 NOTE — Telephone Encounter (Signed)
Please schedule patient for 2 week hospital follow up with APP.

## 2022-03-15 NOTE — Progress Notes (Signed)
PROGRESS NOTE   Tracy Park  RDE:[id number] DOB: May 21, 1956 DOA: 03/13/2022 PCP: Irving Shows, PA-C  Brief Narrative:  66 year old female with left upper lobe adenocarcinoma status post upper lobectomy 2019--follows with [facility] Dr. [name] COPD chronic respiratory failure on 2 L oxygen baseline--- still smoker 2 to 3 cigarettes daily OSA Prediabetes Prior right-sided hydroureteronephrosis in the past Prior left upper extremity SVT--Not on anticoagulation Hypothyroid Hyperlipidemia Reflux  Recent admission 6/2 through 02/10/2022 septic with left upper lobe infiltrate discharged on Omnicef and Doxy and prednisone Readmitted 02/25/2022 with lobar pneumonia and acute hypoxic respiratory failure secondary to probable postobstructive pneumonia she was de-escalated to Augmentin and discharged home  Represented 03/13/2022 shortness of breath lung pain acute fever chills Tmax 101 also urinary frequency without dysuria or flank pain Found to have heart rate 105 BP 99/61, WBC 19 (has been on steroids) Lactic acid 1.5-->0.7  Became less responsive MAP less than 60-started on peripheral Levophed Stabilized rapidly and was transferred back to [facility] for review 03/15/2022  Hospital-Problem based course  Probable postobstructive pneumonia followed by [facility] pulmonology Hypoxic respiratory failure COPD on chronic 2 L oxygen-still smoker Continues on empiric Unasyn-narrow as indicated in the next several days Blood culture X2 is pending Work of breathing is improved-she request local follow-up with pulmonology so appreciate pulmonology following up and setting up appointment in the outpatient setting Patient relatively stable with normotensive pressures so can transfer to [facility] Left upper lobe adenocarcinoma status post upper lobectomy 2019 with chemo Outpatient follow-up with primary pulmonologist Prediabetes CBGs predominantly below 140 Chemistry panel-monitor  trends Normocytic likely dilutional anemia Hemoglobin has dropped but patient is on IV fluids with no overt bleeding-monitor trends Depression/anxiety Continue BuSpar 5 mg 3 times daily,Seroquel 300 at bedtime Patient tells me he does not take Valium and I will discontinue this of the MAR Hypothyroid, hyperlipidemia, reflux  DVT prophylaxis: Lovenox Code Status: Full Family Communication:  Disposition:  Status is: Inpatient Remains inpatient appropriate because:   Remains ill and IV antibiotics to transition to orals   Consultants:  Critical care  Procedures:   Antimicrobials: Unasyn   Subjective:  Coherent and in no distress although at times does become tangential No fever no chills Not producing much sputum No chest pain  Objective: Vitals:   03/15/22 0300 03/15/22 0400 03/15/22 0500 03/15/22 0600  BP: (!) 108/58 109/63 (!) 130/58 108/65  Pulse: 95 97 95 91  Resp: (!) 22 (!) 22 (!) 22 (!) 24  Temp:  99 F (37.2 C)    TempSrc:  Axillary    SpO2: 94% 92% (!) 86% 92%  Weight:   48.6 kg   Height:        Intake/Output Summary (Last 24 hours) at 03/15/2022 0712 Last data filed at 03/15/2022 6314 Gross per 24 hour  Intake 904.39 ml  Output 1450 ml  Net -545.61 ml   Filed Weights   03/13/22 2029 03/14/22 0547 03/15/22 0500  Weight: 45.4 kg 44.9 kg 48.6 kg    Examination:  EOMI NCAT no focal deficit no icterus no pallor No rales no rhonchi-no wheeze S1-S2 no murmur no rub no gallop Abdomen soft no rebound no guarding No lower extremity edema Neuro is intact   Data Reviewed: personally reviewed   CBC    Component Value Date/Time   WBC 11.6 (H) 03/15/2022 0322   RBC 2.88 (L) 03/15/2022 0322   HGB 8.0 (L) 03/15/2022 0322   HCT 26.1 (L) 03/15/2022 0322   PLT 250  03/15/2022 0322   MCV 90.6 03/15/2022 0322   MCH 27.8 03/15/2022 0322   MCHC 30.7 03/15/2022 0322   RDW 18.1 (H) 03/15/2022 0322   LYMPHSABS 2.2 03/13/2022 1852   MONOABS 1.5 (H)  03/13/2022 1852   EOSABS 0.0 03/13/2022 1852   BASOSABS 0.1 03/13/2022 1852      Latest Ref Rng & Units 03/15/2022    3:22 AM 03/14/2022    4:43 AM 03/13/2022    6:52 PM  CMP  Glucose 70 - 99 mg/dL 94  154  173   BUN 8 - 23 mg/dL 14  13  17    Creatinine 0.44 - 1.00 mg/dL 0.47  0.50  0.67   Sodium 135 - 145 mmol/L 142  140  137   Potassium 3.5 - 5.1 mmol/L 3.8  3.6  3.6   Chloride 98 - 111 mmol/L 112  112  103   CO2 22 - 32 mmol/L 25  24  26    Calcium 8.9 - 10.3 mg/dL 8.3  7.9  9.2   Total Protein 6.5 - 8.1 g/dL   7.4   Total Bilirubin 0.3 - 1.2 mg/dL   0.6   Alkaline Phos 38 - 126 U/L   75   AST 15 - 41 U/L   16   ALT 0 - 44 U/L   13      Radiology Studies: CT Angio Chest PE W and/or Wo Contrast  Result Date: 03/13/2022 CLINICAL DATA:  Concern for pulmonary embolism. Short of breath. Lung pain. Home O2. history of lung cancer. * Tracking Code: BO * EXAM: CT ANGIOGRAPHY CHEST WITH CONTRAST TECHNIQUE: Multidetector CT imaging of the chest was performed using the standard protocol during bolus administration of intravenous contrast. Multiplanar CT image reconstructions and MIPs were obtained to evaluate the vascular anatomy. RADIATION DOSE REDUCTION: This exam was performed according to the departmental dose-optimization program which includes automated exposure control, adjustment of the mA and/or kV according to patient size and/or use of iterative reconstruction technique. CONTRAST:  83mL OMNIPAQUE IOHEXOL 350 MG/ML SOLN COMPARISON:  CT 02/25/2022 FINDINGS: Cardiovascular: No evidence of acute pulmonary embolism within the RIGHT pulmonary arteries. There is truncation of the LEFT upper lobe pulmonary arteries related to prior lung cancer surgery. No evidence of LEFT lower lobe pulmonary embolism. Mediastinum/Nodes: No axillary or supraclavicular adenopathy. No mediastinal or hilar adenopathy. No pericardial fluid. Esophagus normal. Lungs/Pleura: There is some mild improvement in consolidation  in the superior LEFT lung compared to CT 02/25/2022. Persistent consolidation remains albeit reduced. Centrilobular emphysema in the RIGHT upper lobe. No suspicious nodularity. Upper Abdomen: Limited view of the liver, kidneys, pancreas are unremarkable. Normal adrenal glands. Musculoskeletal: No aggressive osseous lesion. Review of the MIP images confirms the above findings. IMPRESSION: 1. No evidence acute pulmonary embolism. 2. Postsurgical change in the LEFT lung consistent with lobe LEFT upper lobectomy. 3. Improved consolidation in the LEFT upper lobe is favored resolving infectious process. Recommend follow-up CT to ensure resolution. Electronically Signed   By: Suzy Bouchard M.D.   On: 03/13/2022 20:56   DG Chest Port 1 View  Result Date: 03/13/2022 CLINICAL DATA:  History of lung cancer presenting with shortness of breath. EXAM: PORTABLE CHEST 1 VIEW COMPARISON:  March 06, 2022 FINDINGS: The heart size and mediastinal contours are within normal limits. Stable postoperative changes are seen within the left apex and upper left lung with subsequent left apical scarring and pleural opacification. Stable left-sided volume loss is seen. The right lung is clear.  Surgical clips are seen along the suprahilar and infrahilar regions on the left. There is no evidence of a pleural effusion or pneumothorax. The visualized skeletal structures are unremarkable. IMPRESSION: Stable postoperative changes in the left hemithorax as described above, without evidence of acute or active cardiopulmonary disease. Electronically Signed   By: Virgina Norfolk M.D.   On: 03/13/2022 20:21     Scheduled Meds:  busPIRone  5 mg Oral TID   Chlorhexidine Gluconate Cloth  6 each Topical Q0600   Chlorhexidine Gluconate Cloth  6 each Topical Daily   cholecalciferol  1,000 Units Oral Daily   enoxaparin (LOVENOX) injection  30 mg Subcutaneous Q24H   famotidine  40 mg Oral Daily   guaiFENesin  600 mg Oral BID    ipratropium-albuterol  3 mL Nebulization TID   lactose free nutrition  237 mL Oral TID BM   levothyroxine  137 mcg Oral Q0600   lidocaine  1 patch Transdermal Q24H   magnesium oxide  400 mg Oral Q1200   meloxicam  15 mg Oral Daily   methocarbamol  500 mg Oral Daily   multivitamin with minerals  1 tablet Oral Daily   oxybutynin  5 mg Oral TID   pantoprazole  40 mg Oral Daily   QUEtiapine  300 mg Oral QHS   rosuvastatin  20 mg Oral QHS   Continuous Infusions:  sodium chloride 250 mL (03/15/22 0051)   0.9 % NaCl with KCl 20 mEq / L 100 mL/hr at 03/15/22 0044   ampicillin-sulbactam (UNASYN) IV Stopped (03/15/22 0656)   norepinephrine (LEVOPHED) Adult infusion Stopped (03/14/22 0815)     LOS: 2 days   Time spent: [place], MD [facility] To contact the attending provider between 7A-7P or the covering provider during after hours 7P-7A, please log into the web site [email or web address] and access using universal [place] password for that web site. If you do not have the password, please call the hospital operator.  03/15/2022, 7:12 AM

## 2022-03-16 DIAGNOSIS — J189 Pneumonia, unspecified organism: Secondary | ICD-10-CM | POA: Diagnosis not present

## 2022-03-16 DIAGNOSIS — A419 Sepsis, unspecified organism: Secondary | ICD-10-CM | POA: Diagnosis not present

## 2022-03-16 LAB — CBC
HCT: 26.6 % — ABNORMAL LOW (ref 36.0–46.0)
Hemoglobin: 8.2 g/dL — ABNORMAL LOW (ref 12.0–15.0)
MCH: 27.8 pg (ref 26.0–34.0)
MCHC: 30.8 g/dL (ref 30.0–36.0)
MCV: 90.2 fL (ref 80.0–100.0)
Platelets: 286 10*3/uL (ref 150–400)
RBC: 2.95 MIL/uL — ABNORMAL LOW (ref 3.87–5.11)
RDW: 18.4 % — ABNORMAL HIGH (ref 11.5–15.5)
WBC: 10.7 10*3/uL — ABNORMAL HIGH (ref 4.0–10.5)
nRBC: 0 % (ref 0.0–0.2)

## 2022-03-16 LAB — BASIC METABOLIC PANEL
Anion gap: 8 (ref 5–15)
BUN: 16 mg/dL (ref 8–23)
CO2: 24 mmol/L (ref 22–32)
Calcium: 8.6 mg/dL — ABNORMAL LOW (ref 8.9–10.3)
Chloride: 107 mmol/L (ref 98–111)
Creatinine, Ser: 0.54 mg/dL (ref 0.44–1.00)
GFR, Estimated: >60 mL/min (ref >60)
Glucose, Bld: 99 mg/dL (ref 70–99)
Potassium: 3.9 mmol/L (ref 3.5–5.1)
Sodium: 139 mmol/L (ref 135–145)

## 2022-03-16 MED ORDER — GUAIFENESIN ER 600 MG PO TB12: 600.0000 mg | ORAL_TABLET | Freq: Two times a day (BID) | ORAL | 0 refills | Status: DC

## 2022-03-16 MED ORDER — ACETAMINOPHEN 325 MG PO TABS: 650.0000 mg | ORAL_TABLET | Freq: Four times a day (QID) | ORAL | Status: DC | PRN

## 2022-03-16 MED ORDER — AMOXICILLIN-POT CLAVULANATE ER 1000-62.5 MG PO TB12: 1.0000 | ORAL_TABLET | Freq: Two times a day (BID) | ORAL | 0 refills | Status: DC

## 2022-03-16 NOTE — TOC Initial Note (Addendum)
Transition of Care [facility]) - Initial/Assessment Note    Patient Details  Name: Tracy Park MRN: [id number] Date of Birth: Sep 12, 1955  [facility]) CM/SW Contact:    Leeroy Cha, RN Phone Number: 03/16/2022, 7:20 AM  Clinical Narrative:                 Patient ;lives alone. Vhas transportation issues.  Wanting to know about welfare check and supplements.  Will need to contact the [place] [facility] office for this. Patient dcd to home via taxi. Expected Discharge Plan: Home/Self Care Barriers to Discharge: Continued Medical Work up   Patient Goals and CMS Choice Patient states their goals for this hospitalization and ongoing recovery are:: to go home CMS Medicare.gov Compare Post Acute Care list provided to:: Patient    Expected Discharge Plan and Services Expected Discharge Plan: Home/Self Care   Discharge Planning Services: CM Consult   Living arrangements for the past 2 months: Single Family Home                                      Prior Living Arrangements/Services Living arrangements for the past 2 months: Single Family Home Lives with:: Self Patient language and need for interpreter reviewed:: Yes Do you feel safe going back to the place where you live?: Yes            Criminal Activity/Legal Involvement Pertinent to Current Situation/Hospitalization: No - Comment as needed  Activities of Daily Living Home Assistive Devices/Equipment: Cane (specify quad or straight), Grab bars around toilet, Grab bars in shower, Oxygen ADL Screening (condition at time of admission) Patient's cognitive ability adequate to safely complete daily activities?: Yes Is the patient deaf or have difficulty hearing?: No Does the patient have difficulty seeing, even when wearing glasses/contacts?: No Does the patient have difficulty concentrating, remembering, or making decisions?: No Patient able to express need for assistance with ADLs?: Yes Does the patient have  difficulty dressing or bathing?: No Independently performs ADLs?: Yes (appropriate for developmental age) Does the patient have difficulty walking or climbing stairs?: Yes Weakness of Legs: Both Weakness of Arms/Hands: Both  Permission Sought/Granted                  Emotional Assessment Appearance:: Appears stated age     Orientation: : Oriented to Place, Oriented to Self, Oriented to  Time, Oriented to Situation Alcohol / Substance Use: Not Applicable Psych Involvement: No (comment)  Admission diagnosis:  Sepsis due to pneumonia ([place]) [J18.9, A41.9] Septic shock ([place]) [A41.9, R65.21] Sepsis ([place]) [A41.9] Patient Active Problem List   Diagnosis Date Noted   Septic shock ([place]) 03/14/2022   Sepsis due to pneumonia ([place]) 03/13/2022   Dyslipidemia 03/13/2022   Anxiety and depression 03/13/2022   History of lung cancer 03/13/2022   Tobacco abuse 03/13/2022   Pneumonia 02/27/2022   Adenocarcinoma of lung ([place]) 02/26/2022   Leukocytosis 02/26/2022   COPD with acute exacerbation ([place]) 02/26/2022   Chronic respiratory failure with hypoxia ([place]) 02/26/2022   Protein-calorie malnutrition, severe 08/25/2021   Influenza A with pneumonia 08/16/2021   Sepsis ([place]) 08/16/2021   Hyponatremia 08/16/2021   Hypokalemia 08/16/2021   Hypomagnesemia 08/16/2021   Hydronephrosis 05/03/2021   Acute encephalopathy 05/02/2021   CAP (community acquired pneumonia) 04/10/2020   GERD (gastroesophageal reflux disease) 04/28/2012   Hypothyroidism (acquired) 04/28/2012   Nicotine abuse 04/28/2012   Depression 04/28/2012  Chest pain, atypical 04/28/2012   PCP:  Irving Shows, PA-C Pharmacy:   [id number] [place], [address] [place] [address] Phone: [phone] Fax: [phone]     Social Determinants of Health (SDOH) Interventions    Readmission Risk Interventions    03/01/2022    9:00 AM  Readmission Risk Prevention Plan   Transportation Screening Complete  PCP or Specialist Appt within 5-7 Days Complete  Home Care Screening Complete  Medication Review (RN CM) Complete

## 2022-03-16 NOTE — Discharge Summary (Signed)
Physician Discharge Summary  Tracy Park Tracy Park:[id number] DOB: 08/11/1956 DOA: 03/13/2022  PCP: Tracy Shows, PA-C  Admit date: 03/13/2022 Discharge date: 03/16/2022  Time spent: 27 minutes  Recommendations for Outpatient Follow-up:  Needs to complete Augmentin full course 14 days Very high risk for readmission given chronic respiratory failure lives alone Needs further follow-up with pulmonology in the outpatient setting-requires scans versus CTs with regards to underlying left upper lobe adeno CA Check A1c in the outpatient setting given patient on steroids previously  Discharge Diagnoses:  MAIN problem for hospitalization   Septic shock on admission likely secondary to pneumonia Postobstructive pneumonia secondary to lung cancer  Please see below for itemized issues addressed in [place]- refer to other progress notes for clarity if needed  Discharge Condition: Fair  Diet recommendation: Heart healthy  Filed Weights   03/14/22 0547 03/15/22 0500 03/16/22 0327  Weight: 44.9 kg 48.6 kg 46.9 kg    History of present illness:  66 year old female with left upper lobe adenocarcinoma status post upper lobectomy 2019--follows with [facility] Dr. [name] COPD chronic respiratory failure on 2 L oxygen baseline--- still smoker 2 to 3 cigarettes daily OSA Prediabetes Prior right-sided hydroureteronephrosis in the past Prior left upper extremity SVT--Not on anticoagulation Hypothyroid Hyperlipidemia Reflux   Recent admission 6/2 through 02/10/2022 septic with left upper lobe infiltrate discharged on Omnicef and Doxy and prednisone Readmitted 02/25/2022 with lobar pneumonia and acute hypoxic respiratory failure secondary to probable postobstructive pneumonia she was de-escalated to Augmentin and discharged home   Represented 03/13/2022 shortness of breath lung pain acute fever chills Tmax 101 also urinary frequency without dysuria or flank pain Found to have heart rate 105 BP  99/61, WBC 19 (has been on steroids) Lactic acid 1.5-->0.7   Became less responsive MAP less than 60-started on peripheral Levophed Stabilized rapidly and was transferred back to [facility] for review 03/15/2022  Hospital Course:  Probable postobstructive pneumonia followed by [facility] pulmonology with septic shock this admission Hypoxic respiratory failure COPD on chronic 2 L oxygen-still smoker Patient was treated with empiric Unasyn and narrowed to Augmentin on discharge blood cultures were preliminarily negative sputum culture could not be obtained  patient request follow-up locally with pulmonology they will set up an appointment Work of breathing is improved Left upper lobe adenocarcinoma status post upper lobectomy 2019 with chemo Outpatient follow-up with primary pulmonologist Prediabetes CBGs predominantly below 140-outpatient work-up Chemistry panel-monitor trends Normocytic likely dilutional anemia Hemoglobin has dropped but patient is on IV fluids with no overt bleeding-hemoglobin relatively stable Depression/anxiety Continue BuSpar 5 mg 3 times daily,Seroquel 300 at bedtime Patient tells me he does not take Valium and I will discontinue this of the [facility] Malnutrition BMI 18 Complicates care Hypothyroid, hyperlipidemia, reflux    Discharge Exam: Vitals:   03/16/22 0756 03/16/22 0802  BP:    Pulse:    Resp:    Temp: 100.1 F (37.8 C)   SpO2:  92%    Subj on day of d/c   Awake coherent feels overall much better than prior No fever no chills no nausea no vomiting Did not sleep well overnight  General Exam on discharge  EOMI NCAT no focal deficit no icterus no pallor Mild wheeze posterolaterally no rales rhonchi S1-S2 no murmur no rub no gallop Abdomen soft no rebound no guarding ROM intact moving 4 limbs equally Neuro intact power 5/5  Discharge Instructions   Discharge Instructions     Diet - low sodium heart healthy   Complete  by: As directed     Discharge instructions   Complete by: As directed    Make sure that you take your meds appropriately and complete all antibiotics you have 12 days left Use oxygen as needed on discharge-ensure that you get follow-up with pulmonology Follow-up with your primary physician in about 1 week and get labs and follow-up for your chronic medical conditions-continue to use your inhalers   Increase activity slowly   Complete by: As directed       Allergies as of 03/16/2022       Reactions   Red Dye Hives, Itching   Oxycontin [oxycodone Hcl]    Other reaction(s): Hallucinations   Prednisone Hives, Nausea And Vomiting   Aspirin Hives   Tape Rash   ONLY USE PAPER TAPE        Medication List     STOP taking these medications    azithromycin 250 MG tablet Commonly known as: ZITHROMAX   cefdinir 300 MG capsule Commonly known as: OMNICEF   Cholecalciferol 25 MCG (1000 UT) tablet   diazepam 2 MG tablet Commonly known as: Valium   HYDROcodone-acetaminophen 5-325 MG tablet Commonly known as: NORCO/VICODIN       TAKE these medications    acetaminophen 325 MG tablet Commonly known as: TYLENOL Take 650 mg by mouth every 6 (six) hours as needed for moderate pain or headache. What changed: Another medication with the same name was added. Make sure you understand how and when to take each.   acetaminophen 325 MG tablet Commonly known as: TYLENOL Take 2 tablets (650 mg total) by mouth every 6 (six) hours as needed for mild pain (or Fever >/= 101). What changed: You were already taking a medication with the same name, and this prescription was added. Make sure you understand how and when to take each.   amoxicillin-clavulanate 1000-62.5 MG 12 hr tablet Commonly known as: AUGMENTIN XR Take 1 tablet by mouth 2 (two) times daily for 12 days.   busPIRone 5 MG tablet Commonly known as: BUSPAR Take 5 mg by mouth 3 (three) times daily.   Centrum Women Tabs Take 1 tablet by mouth  daily.   famotidine 40 MG tablet Commonly known as: PEPCID Take 40 mg by mouth daily.   guaiFENesin 600 MG 12 hr tablet Commonly known as: MUCINEX Take 1 tablet (600 mg total) by mouth 2 (two) times daily.   ipratropium-albuterol 0.5-2.5 (3) MG/3ML Soln Commonly known as: DUONEB Take 3 mLs by nebulization every 6 (six) hours as needed (shortness of breath).   levothyroxine 137 MCG tablet Commonly known as: SYNTHROID Take 137 mcg by mouth daily before breakfast.   lidocaine 5 % Commonly known as: Lidoderm Place 1 patch onto the skin daily. Remove & Discard patch within 12 hours or as directed by MD   Magnesium Oxide 400 MG Caps Take 1 capsule (400 mg total) by mouth daily at 12 noon. What changed: when to take this   meloxicam 15 MG tablet Commonly known as: MOBIC Take 15 mg by mouth daily.   methocarbamol 500 MG tablet Commonly known as: ROBAXIN Take 500 mg by mouth 2 (two) times daily.   nitroGLYCERIN 0.4 MG SL tablet Commonly known as: NITROSTAT Place 0.4 mg under the tongue every 5 (five) minutes as needed for chest pain.   omeprazole 20 MG capsule Commonly known as: PRILOSEC Take 20 mg by mouth daily. Takes an additional capsule if needed for acid reflux   oxybutynin 5 MG tablet  Commonly known as: DITROPAN Take 5 mg by mouth 3 (three) times daily.   OXYGEN Inhale 2 L into the lungs daily as needed (for low oxygen).   QUEtiapine 300 MG tablet Commonly known as: SEROQUEL Take 300 mg by mouth at bedtime.   rosuvastatin 20 MG tablet Commonly known as: CRESTOR Take 20 mg by mouth daily.   SYSTANE COMPLETE OP Place 1 drop into both eyes daily as needed (dry eyes).   Trelegy Ellipta 100-62.5-25 MCG/ACT Aepb Generic drug: Fluticasone-Umeclidin-Vilant Take 1 puff by mouth daily.       Allergies  Allergen Reactions   Red Dye Hives and Itching   Oxycontin [Oxycodone Hcl]     Other reaction(s): Hallucinations   Prednisone Hives and Nausea And Vomiting    Aspirin Hives   Tape Rash    ONLY USE PAPER TAPE      The results of significant diagnostics from this hospitalization (including imaging, microbiology, ancillary and laboratory) are listed below for reference.    Significant Diagnostic Studies: CT Angio Chest PE W and/or Wo Contrast  Result Date: 03/13/2022 CLINICAL DATA:  Concern for pulmonary embolism. Short of breath. Lung pain. Home O2. history of lung cancer. * Tracking Code: BO * EXAM: CT ANGIOGRAPHY CHEST WITH CONTRAST TECHNIQUE: Multidetector CT imaging of the chest was performed using the standard protocol during bolus administration of intravenous contrast. Multiplanar CT image reconstructions and MIPs were obtained to evaluate the vascular anatomy. RADIATION DOSE REDUCTION: This exam was performed according to the departmental dose-optimization program which includes automated exposure control, adjustment of the mA and/or kV according to patient size and/or use of iterative reconstruction technique. CONTRAST:  29mL OMNIPAQUE IOHEXOL 350 MG/ML SOLN COMPARISON:  CT 02/25/2022 FINDINGS: Cardiovascular: No evidence of acute pulmonary embolism within the RIGHT pulmonary arteries. There is truncation of the LEFT upper lobe pulmonary arteries related to prior lung cancer surgery. No evidence of LEFT lower lobe pulmonary embolism. Mediastinum/Nodes: No axillary or supraclavicular adenopathy. No mediastinal or hilar adenopathy. No pericardial fluid. Esophagus normal. Lungs/Pleura: There is some mild improvement in consolidation in the superior LEFT lung compared to CT 02/25/2022. Persistent consolidation remains albeit reduced. Centrilobular emphysema in the RIGHT upper lobe. No suspicious nodularity. Upper Abdomen: Limited view of the liver, kidneys, pancreas are unremarkable. Normal adrenal glands. Musculoskeletal: No aggressive osseous lesion. Review of the MIP images confirms the above findings. IMPRESSION: 1. No evidence acute pulmonary  embolism. 2. Postsurgical change in the LEFT lung consistent with lobe LEFT upper lobectomy. 3. Improved consolidation in the LEFT upper lobe is favored resolving infectious process. Recommend follow-up CT to ensure resolution. Electronically Signed   By: Suzy Bouchard M.D.   On: 03/13/2022 20:56   DG Chest Port 1 View  Result Date: 03/13/2022 CLINICAL DATA:  History of lung cancer presenting with shortness of breath. EXAM: PORTABLE CHEST 1 VIEW COMPARISON:  March 06, 2022 FINDINGS: The heart size and mediastinal contours are within normal limits. Stable postoperative changes are seen within the left apex and upper left lung with subsequent left apical scarring and pleural opacification. Stable left-sided volume loss is seen. The right lung is clear. Surgical clips are seen along the suprahilar and infrahilar regions on the left. There is no evidence of a pleural effusion or pneumothorax. The visualized skeletal structures are unremarkable. IMPRESSION: Stable postoperative changes in the left hemithorax as described above, without evidence of acute or active cardiopulmonary disease. Electronically Signed   By: Virgina Norfolk M.D.   On: 03/13/2022 20:21  DG Lumbar Spine Complete  Result Date: 03/11/2022 CLINICAL DATA:  Left-sided flank pain radiating into the left leg, initial encounter EXAM: LUMBAR SPINE - COMPLETE 4+ VIEW COMPARISON:  08/20/2021 FINDINGS: Five lumbar type vertebral bodies are well visualized. Vertebral body height is well maintained. No pars defects are noted. Degenerative anterolisthesis of L4 on L5 is noted. This is degenerative in nature as no pars defects are seen. IMPRESSION: Stable degenerative change with anterolisthesis of L4 on L5. Electronically Signed   By: Inez Catalina M.D.   On: 03/11/2022 20:49   DG Chest 2 View  Result Date: 03/07/2022 CLINICAL DATA:  Shortness of breath EXAM: CHEST - 2 VIEW COMPARISON:  03/06/2022.  CT 02/25/2022 FINDINGS: Postoperative changes on  the left with volume loss. Persistent density in the left upper lung/apex, unchanged since prior study. Hyperinflation. Right lung clear. Heart is normal size. No effusions or acute bony abnormality. IMPRESSION: Postoperative changes on the left with stable chronic density in the left upper lung. No acute findings. Electronically Signed   By: Rolm Baptise M.D.   On: 03/07/2022 00:18   DG Chest Portable 1 View  Result Date: 03/06/2022 CLINICAL DATA:  Shortness of breath and chest pain. EXAM: PORTABLE CHEST 1 VIEW COMPARISON:  Recent portable chest 02/28/2022, chest CT with contrast 02/25/2022 FINDINGS: Left-sided volume loss and upper lobectomy are again noted with persistent pleural-parenchymal consolidation in the apical left lung. The appearance is not significantly changed. The rest of the lungs clear with COPD.  The cardiac size is normal. No pneumothorax is seen with stable mediastinal configuration. No acute osseous abnormality. IMPRESSION: Persistent left apical pleural-parenchymal consolidation with coarse interstitial change superimposed on remote left upper lobectomy. Additional scattered opacities noted in the left base on the last study are not seen today. Electronically Signed   By: Telford Nab M.D.   On: 03/06/2022 02:29   DG Forearm Right  Result Date: 03/03/2022 CLINICAL DATA:  Recent fall with right forearm pain, initial encounter EXAM: RIGHT FOREARM - 2 VIEW COMPARISON:  None Available. FINDINGS: There is no evidence of fracture or other focal bone lesions. Mild soft tissue swelling is noted distally. IMPRESSION: Soft tissue swelling distally without acute bony abnormality. Electronically Signed   By: Inez Catalina M.D.   On: 03/03/2022 02:49   DG CHEST PORT 1 VIEW  Result Date: 02/28/2022 CLINICAL DATA:  [id number] chest pain, status post lung surgery for lung CA, COPD and MI EXAM: PORTABLE CHEST 1 VIEW COMPARISON:  February 25, 2022 FINDINGS: Heart and mediastinal contours are stable.  Postsurgical changes with decrease in the volume of the left upper lobe with leftward tracheal deviation. There is opacity seen at the left upper lobe and has mildly improved in the interim. Remainder of the lung fields are clear. Blunting of the left CP angle. No pleural effusion or vascular congestion. IMPRESSION: The opacity in the left upper lobe has mildly decreased in the interim of improvement of pneumonia. Remainder of the lung fields are clear. Electronically Signed   By: Frazier Richards M.D.   On: 02/28/2022 07:58   CT Angio Chest PE W/Cm &/Or Wo Cm  Result Date: 02/25/2022 CLINICAL DATA:  Pulmonary embolus suspected. Positive D-dimer. Nausea and vomiting, chills, and body aches for 2 days. EXAM: CT ANGIOGRAPHY CHEST WITH CONTRAST TECHNIQUE: Multidetector CT imaging of the chest was performed using the standard protocol during bolus administration of intravenous contrast. Multiplanar CT image reconstructions and MIPs were obtained to evaluate the vascular anatomy. RADIATION DOSE  REDUCTION: This exam was performed according to the departmental dose-optimization program which includes automated exposure control, adjustment of the mA and/or kV according to patient size and/or use of iterative reconstruction technique. CONTRAST:  40mL OMNIPAQUE IOHEXOL 350 MG/ML SOLN COMPARISON:  04/09/2020 FINDINGS: Cardiovascular: Good opacification of the central and segmental pulmonary arteries. No focal filling defects. No evidence of significant pulmonary embolus. Normal heart size. No pericardial effusions. Normal caliber thoracic aorta. No dissection. Calcification of the aorta and coronary arteries. Mediastinum/Nodes: No enlarged mediastinal, hilar, or axillary lymph nodes. Thyroid gland, trachea, and esophagus demonstrate no significant findings. Lungs/Pleura: Postoperative changes and volume loss on the left consistent with previous left upper lobectomy. Diffuse emphysematous changes in the lungs. Coarse  consolidation with air bronchograms in the left upper chest likely representing pneumonia. Radiation change would be a less likely cause. Tree-in-bud infiltrates continue into the left lower lung and there is mild patchy infiltration in the right upper lung. Similar infiltrates were present in the previous study but in the left lower lung rather than the upper lung region. Prominent bronchial wall thickening with mucous plugging. Pleural thickening in the left upper chest is similar to prior study, possibly radiation change. No pleural effusion. No pneumothorax. Upper Abdomen: No acute abnormality. Musculoskeletal: No chest wall abnormality. No acute or significant osseous findings. Review of the MIP images confirms the above findings. IMPRESSION: 1. No evidence of significant pulmonary embolus. 2. Consolidation with air bronchograms in the left upper lung with peribronchial nodular infiltrates throughout the left lung and in the right upper lung. Mild pleural thickening. Bronchial wall thickening with mucous plugging. Changes likely represent pneumonia. Radiation change would be a less likely consideration. No focal masses identified but follow-up after resolution of acute process is recommended to exclude underlying lesion. 3. Diffuse emphysematous change in the lungs. 4. Aortic atherosclerosis. Electronically Signed   By: Lucienne Capers M.D.   On: 02/25/2022 22:49   DG Chest 2 View  Result Date: 02/25/2022 CLINICAL DATA:  Shortness of breath for several days, central chest pain especially with deep breathing; history of COPD, lung cancer, MI, smoker, pneumonia EXAM: CHEST - 2 VIEW COMPARISON:  08/20/2021 FINDINGS: Normal heart size, mediastinal contours, and pulmonary vascularity. Volume loss and scarring in the LEFT upper lobe. New LEFT upper lobe infiltrate consistent with pneumonia. Underlying emphysematous changes consistent with COPD. Resolution of previously identified RIGHT upper lobe and LEFT  basilar infiltrates. No pleural effusion or pneumothorax. Osseous demineralization. Atherosclerotic calcifications aorta. IMPRESSION: COPD changes with postsurgical changes/scarring in LEFT upper lobe. New coexistent LEFT upper lobe infiltrate question pneumonia; follow-up imaging until resolution recommended to exclude underlying abnormalities including tumor. Aortic Atherosclerosis (ICD10-I70.0) and Emphysema (ICD10-J43.9). Electronically Signed   By: Lavonia Dana M.D.   On: 02/25/2022 16:22    Microbiology: Recent Results (from the past 240 hour(s))  SARS Coronavirus 2 by RT PCR (hospital order, performed in [facility] hospital lab) *cepheid single result test* Anterior Nasal Swab     Status: None   Collection Time: 03/13/22  7:00 PM   Specimen: Anterior Nasal Swab  Result Value Ref Range Status   SARS Coronavirus 2 by RT PCR NEGATIVE NEGATIVE Final    Comment: (NOTE) SARS-CoV-2 target nucleic acids are NOT DETECTED.  The SARS-CoV-2 RNA is generally detectable in upper and lower respiratory specimens during the acute phase of infection. The lowest concentration of SARS-CoV-2 viral copies this assay can detect is 250 copies / mL. A negative result does not preclude SARS-CoV-2 infection and  should not be used as the sole basis for treatment or other patient management decisions.  A negative result may occur with improper specimen collection / handling, submission of specimen other than nasopharyngeal swab, presence of viral mutation(s) within the areas targeted by this assay, and inadequate number of viral copies (<250 copies / mL). A negative result must be combined with clinical observations, patient history, and epidemiological information.  Fact Sheet for Patients:   https://www.patel.info/  Fact Sheet for Healthcare Providers: https://hall.com/  This test is not yet approved or  cleared by the [place] FDA and has been authorized for  detection and/or diagnosis of SARS-CoV-2 by FDA under an Emergency Use Authorization (EUA).  This EUA will remain in effect (meaning this test can be used) for the duration of the COVID-19 declaration under Section 564(b)(1) of the Act, 21 U.S.C. section 360bbb-3(b)(1), unless the authorization is terminated or revoked sooner.  Performed at [facility], [place] [address] [place], [place] [address]   Blood culture (routine x 2)     Status: None (Preliminary result)   Collection Time: 03/13/22  7:20 PM   Specimen: BLOOD  Result Value Ref Range Status   Specimen Description   Final    BLOOD BLOOD LEFT ARM Performed at [facility] [address] [place], [place] [address]    Special Requests   Final    BOTTLES DRAWN AEROBIC AND ANAEROBIC Blood Culture adequate volume Performed at [facility] [address] [place], [place] [address]    Culture   Final    NO GROWTH 2 DAYS Performed at [facility] [address], [place], [place] [address]    Report Status PENDING  Incomplete  Blood culture (routine x 2)     Status: None (Preliminary result)   Collection Time: 03/14/22  8:19 AM   Specimen: BLOOD RIGHT ARM  Result Value Ref Range Status   Specimen Description   Final    BLOOD RIGHT ARM Performed at [facility] [address], [place], [place] [address]    Special Requests   Final    BOTTLES DRAWN AEROBIC AND ANAEROBIC Blood Culture adequate volume Performed at [facility] [address], [place], [place] [address]    Culture   Final    NO GROWTH < 24 HOURS Performed at [facility] [address], [place], [place] [address]    Report Status PENDING  Incomplete  MRSA Next Gen by PCR, Nasal     Status: None   Collection Time: 03/14/22  8:20 AM   Specimen: Nasal Mucosa; Nasal Swab  Result Value Ref Range Status   MRSA by PCR Next Gen NOT DETECTED NOT DETECTED  Final    Comment: (NOTE) The GeneXpert MRSA Assay (FDA approved for NASAL specimens only), is one component of a comprehensive MRSA colonization surveillance program. It is not intended to diagnose MRSA infection nor to guide or monitor treatment for MRSA infections. Test performance is not FDA approved in patients less than 50 years old. Performed at [facility], [place] [address], [place], [place] [address]      Labs: Basic Metabolic Panel: Recent Labs  Lab 03/11/22 1756 03/13/22 1852 03/14/22 0443 03/15/22 0322 03/16/22 0308  NA 142 137 140 142 139  K 4.2 3.6 3.6 3.8 3.9  CL 105 103 112* 112* 107  CO2 29 26 24 25 24   GLUCOSE 116* 173* 154* 94 99  BUN 16 17 13 14 16   CREATININE 0.56 0.67 0.50 0.47 0.54  CALCIUM 9.2 9.2 7.9* 8.3* 8.6*   Liver Function Tests: Recent Labs  Lab 03/13/22 1852  AST 16  ALT 13  ALKPHOS 75  BILITOT 0.6  PROT 7.4  ALBUMIN 3.3*   No results for input(s): "LIPASE", "AMYLASE" in the last 168 hours. No results for input(s): "AMMONIA" in the last 168 hours. CBC: Recent Labs  Lab 03/11/22 1756 03/13/22 1852 03/14/22 0443 03/15/22 0322 03/16/22 0308  WBC 16.0* 19.9* 20.2* 11.6* 10.7*  NEUTROABS  --  16.1*  --   --   --   HGB 10.9* 10.8* 8.8* 8.0* 8.2*  HCT 34.9* 34.2* 28.2* 26.1* 26.6*  MCV 90.9 89.3 90.7 90.6 90.2  PLT 524* 435* 325 250 286   Cardiac Enzymes: No results for input(s): "CKTOTAL", "CKMB", "CKMBINDEX", "TROPONINI" in the last 168 hours. BNP: BNP (last 3 results) Recent Labs    03/06/22 0402  BNP 96.6    ProBNP (last 3 results) No results for input(s): "PROBNP" in the last 8760 hours.  CBG: No results for input(s): "GLUCAP" in the last 168 hours.     Signed:  Nita Sells MD   [facility] 03/16/2022, 8:12 AM

## 2022-03-16 NOTE — TOC Initial Note (Deleted)
Transition of Care [facility]) - Initial/Assessment Note    Patient Details  Name: INFINITI HOEFLING MRN: [id number] Date of Birth: 1956-06-01  Transition of Care [facility]) CM/SW Contact:    Leeroy Cha, RN Phone Number: 03/16/2022, 7:17 AM  Clinical Narrative:                  Transition of Care [facility]) Screening Note   Patient Details  Name: KATE LAROCK Date of Birth: 04/28/1956   Transition of Care [facility]) CM/SW Contact:    Leeroy Cha, RN Phone Number: 03/16/2022, 7:17 AM    Transition of Care Department [facility]) has reviewed patient and no TOC needs have been identified at this time. We will continue to monitor patient advancement through interdisciplinary progression rounds. If new patient transition needs arise, please place a TOC consult.    Expected Discharge Plan: Home/Self Care Barriers to Discharge: Continued Medical Work up   Patient Goals and CMS Choice Patient states their goals for this hospitalization and ongoing recovery are:: to go home CMS Medicare.gov Compare Post Acute Care list provided to:: Patient    Expected Discharge Plan and Services Expected Discharge Plan: Home/Self Care   Discharge Planning Services: CM Consult   Living arrangements for the past 2 months: Single Family Home                                      Prior Living Arrangements/Services Living arrangements for the past 2 months: Single Family Home Lives with:: Self Patient language and need for interpreter reviewed:: Yes Do you feel safe going back to the place where you live?: Yes            Criminal Activity/Legal Involvement Pertinent to Current Situation/Hospitalization: No - Comment as needed  Activities of Daily Living Home Assistive Devices/Equipment: Cane (specify quad or straight), Grab bars around toilet, Grab bars in shower, Oxygen ADL Screening (condition at time of admission) Patient's cognitive ability adequate to safely complete daily  activities?: Yes Is the patient deaf or have difficulty hearing?: No Does the patient have difficulty seeing, even when wearing glasses/contacts?: No Does the patient have difficulty concentrating, remembering, or making decisions?: No Patient able to express need for assistance with ADLs?: Yes Does the patient have difficulty dressing or bathing?: No Independently performs ADLs?: Yes (appropriate for developmental age) Does the patient have difficulty walking or climbing stairs?: Yes Weakness of Legs: Both Weakness of Arms/Hands: Both  Permission Sought/Granted                  Emotional Assessment Appearance:: Appears stated age     Orientation: : Oriented to Place, Oriented to Self, Oriented to  Time, Oriented to Situation Alcohol / Substance Use: Not Applicable Psych Involvement: No (comment)  Admission diagnosis:  Sepsis due to pneumonia ([place]) [J18.9, A41.9] Septic shock ([place]) [A41.9, R65.21] Sepsis ([place]) [A41.9] Patient Active Problem List   Diagnosis Date Noted   Septic shock ([place]) 03/14/2022   Sepsis due to pneumonia ([place]) 03/13/2022   Dyslipidemia 03/13/2022   Anxiety and depression 03/13/2022   History of lung cancer 03/13/2022   Tobacco abuse 03/13/2022   Pneumonia 02/27/2022   Adenocarcinoma of lung ([place]) 02/26/2022   Leukocytosis 02/26/2022   COPD with acute exacerbation ([place]) 02/26/2022   Chronic respiratory failure with hypoxia ([place]) 02/26/2022   Protein-calorie malnutrition, severe 08/25/2021   Influenza A with pneumonia 08/16/2021  Sepsis ([place]) 08/16/2021   Hyponatremia 08/16/2021   Hypokalemia 08/16/2021   Hypomagnesemia 08/16/2021   Hydronephrosis 05/03/2021   Acute encephalopathy 05/02/2021   CAP (community acquired pneumonia) 04/10/2020   GERD (gastroesophageal reflux disease) 04/28/2012   Hypothyroidism (acquired) 04/28/2012   Nicotine abuse 04/28/2012   Depression 04/28/2012   Chest pain, atypical 04/28/2012   PCP:  Irving Shows,  PA-C Pharmacy:   [id number] [place], [place] [address] Phone: [phone] Fax: [phone]     Social Determinants of Health (SDOH) Interventions    Readmission Risk Interventions    03/01/2022    9:00 AM  Readmission Risk Prevention Plan  Transportation Screening Complete  PCP or Specialist Appt within 5-7 Days Complete  Home Care Screening Complete  Medication Review (RN CM) Complete

## 2022-03-18 LAB — CULTURE, BLOOD (ROUTINE X 2)
Culture: NO GROWTH
Special Requests: ADEQUATE

## 2022-03-19 ENCOUNTER — Emergency Department (HOSPITAL_COMMUNITY)
Admission: EM | Admit: 2022-03-19 | Discharge: 2022-03-19 | Disposition: A | Discharge status: Home / Self Care | Payer: 59 | Attending: Emergency Medicine | Admitting: Emergency Medicine

## 2022-03-19 ENCOUNTER — Other Ambulatory Visit: Payer: Self-pay

## 2022-03-19 ENCOUNTER — Emergency Department (HOSPITAL_COMMUNITY): Payer: 59

## 2022-03-19 ENCOUNTER — Encounter (HOSPITAL_COMMUNITY): Payer: Self-pay | Admitting: Emergency Medicine

## 2022-03-19 DIAGNOSIS — J449 Chronic obstructive pulmonary disease, unspecified: Secondary | ICD-10-CM | POA: Diagnosis not present

## 2022-03-19 DIAGNOSIS — Z7951 Long term (current) use of inhaled steroids: Secondary | ICD-10-CM | POA: Insufficient documentation

## 2022-03-19 DIAGNOSIS — D649 Anemia, unspecified: Secondary | ICD-10-CM | POA: Diagnosis not present

## 2022-03-19 DIAGNOSIS — F17210 Nicotine dependence, cigarettes, uncomplicated: Secondary | ICD-10-CM | POA: Diagnosis not present

## 2022-03-19 DIAGNOSIS — J189 Pneumonia, unspecified organism: Secondary | ICD-10-CM | POA: Diagnosis not present

## 2022-03-19 DIAGNOSIS — R7981 Abnormal blood-gas level: Secondary | ICD-10-CM | POA: Insufficient documentation

## 2022-03-19 DIAGNOSIS — D72829 Elevated white blood cell count, unspecified: Secondary | ICD-10-CM | POA: Diagnosis not present

## 2022-03-19 DIAGNOSIS — Z85118 Personal history of other malignant neoplasm of bronchus and lung: Secondary | ICD-10-CM | POA: Diagnosis not present

## 2022-03-19 DIAGNOSIS — R0602 Shortness of breath: Secondary | ICD-10-CM | POA: Diagnosis present

## 2022-03-19 LAB — CBC WITH DIFFERENTIAL/PLATELET
Abs Immature Granulocytes: 0.06 10*3/uL (ref 0.00–0.07)
Basophils Absolute: 0 10*3/uL (ref 0.0–0.1)
Basophils Relative: 0 %
Eosinophils Absolute: 0.1 10*3/uL (ref 0.0–0.5)
Eosinophils Relative: 1 %
HCT: 31.9 % — ABNORMAL LOW (ref 36.0–46.0)
Hemoglobin: 9.7 g/dL — ABNORMAL LOW (ref 12.0–15.0)
Immature Granulocytes: 1 %
Lymphocytes Relative: 24 %
Lymphs Abs: 2.6 10*3/uL (ref 0.7–4.0)
MCH: 27.3 pg (ref 26.0–34.0)
MCHC: 30.4 g/dL (ref 30.0–36.0)
MCV: 89.9 fL (ref 80.0–100.0)
Monocytes Absolute: 0.8 10*3/uL (ref 0.1–1.0)
Monocytes Relative: 7 %
Neutro Abs: 7.3 10*3/uL (ref 1.7–7.7)
Neutrophils Relative %: 67 %
Platelets: 403 10*3/uL — ABNORMAL HIGH (ref 150–400)
RBC: 3.55 MIL/uL — ABNORMAL LOW (ref 3.87–5.11)
RDW: 18.1 % — ABNORMAL HIGH (ref 11.5–15.5)
WBC: 10.9 10*3/uL — ABNORMAL HIGH (ref 4.0–10.5)
nRBC: 0 % (ref 0.0–0.2)

## 2022-03-19 LAB — BASIC METABOLIC PANEL
Anion gap: 6 (ref 5–15)
BUN: 13 mg/dL (ref 8–23)
CO2: 33 mmol/L — ABNORMAL HIGH (ref 22–32)
Calcium: 8.9 mg/dL (ref 8.9–10.3)
Chloride: 102 mmol/L (ref 98–111)
Creatinine, Ser: 0.47 mg/dL (ref 0.44–1.00)
GFR, Estimated: >60 mL/min (ref >60)
Glucose, Bld: 97 mg/dL (ref 70–99)
Potassium: 4 mmol/L (ref 3.5–5.1)
Sodium: 141 mmol/L (ref 135–145)

## 2022-03-19 LAB — CULTURE, BLOOD (ROUTINE X 2)
Culture: NO GROWTH
Special Requests: ADEQUATE

## 2022-03-19 MED ORDER — SODIUM CHLORIDE 0.9 % IV SOLN
1.0000 g | Freq: Once | INTRAVENOUS | Status: AC
  Administered 2022-03-19: 1 g via INTRAVENOUS
  Filled 2022-03-19: qty 10

## 2022-03-19 MED ORDER — ALBUTEROL SULFATE HFA 108 (90 BASE) MCG/ACT IN AERS: 2.0000 | INHALATION_SPRAY | RESPIRATORY_TRACT | Status: DC | PRN

## 2022-03-19 MED ORDER — AMOXICILLIN-POT CLAVULANATE 875-125 MG PO TABS: 1.0000 | ORAL_TABLET | Freq: Two times a day (BID) | ORAL | 0 refills | Status: DC

## 2022-03-19 MED ORDER — SODIUM CHLORIDE 0.9 % IV SOLN: INTRAVENOUS | Status: DC

## 2022-03-19 MED ORDER — FENTANYL CITRATE PF 50 MCG/ML IJ SOSY
50.0000 ug | PREFILLED_SYRINGE | Freq: Once | INTRAMUSCULAR | Status: AC
  Administered 2022-03-19: 50 ug via INTRAVENOUS
  Filled 2022-03-19: qty 1

## 2022-03-19 NOTE — ED Notes (Signed)
Pt is inquiring about something to eat. I advised pt to wait on blood work to be complete and we will see what the provider says

## 2022-03-19 NOTE — Discharge Instructions (Signed)
We sent a prescription for Augmentin to your pharmacy.  Please obtain it and start taking it.  Try to stop smoking.  Get plenty of rest and drink a lot of fluids.  Use Robitussin-DM if needed for cough.

## 2022-03-19 NOTE — ED Triage Notes (Signed)
Patient BIBA from home c/o SOB. She was recently diagnosed w/ PNA, reports taking medications but increased SOB. Hx lung cancer in remission since 2018. Pt on 2 L [place] at home.    BP 156/90 HR 112 98% 3 L [place] CBG 124

## 2022-03-19 NOTE — ED Provider Notes (Signed)
[place] DEPT Provider Note   CSN: [id number] Arrival date & time: 03/19/22  1801     History {Add pertinent medical, surgical, social history, OB history to HPI:1} Chief Complaint  Patient presents with   Shortness of Breath    Tracy Park is a 66 y.o. female.  HPI Patient presenting with complaint of ongoing shortness of breath, nonproductive cough and general weakness.  She is supposed to be taking Augmentin for pneumonia, diagnosed and treated during hospitalization, 6 days ago.  She was discharged 3 days ago.  She states her pharmacy did not have the Augmentin so she did not take it.  She is using her other medications, for COPD.  She continues to smoke cigarettes.  She is able to eat and is currently hungry.  She had surgery, partial pneumonectomy for lung cancer, 5 years ago.  She states her cancer is in remission.  She has a history of lung cancer, tobacco abuse, depression, and pneumonia.    Home Medications Prior to Admission medications   Medication Sig Start Date End Date Taking? Authorizing Provider  acetaminophen (TYLENOL) 325 MG tablet Take 650 mg by mouth every 6 (six) hours as needed for moderate pain or headache.    [provider]  acetaminophen (TYLENOL) 325 MG tablet Take 2 tablets (650 mg total) by mouth every 6 (six) hours as needed for mild pain (or Fever >/= 101). 03/16/22   Nita Sells, MD  amoxicillin-clavulanate (AUGMENTIN XR) 1000-62.5 MG 12 hr tablet Take 1 tablet by mouth 2 (two) times daily for 12 days. 03/16/22 03/28/22  Nita Sells, MD  busPIRone (BUSPAR) 5 MG tablet Take 5 mg by mouth 3 (three) times daily.    [provider]  famotidine (PEPCID) 40 MG tablet Take 40 mg by mouth daily. 02/14/20   [provider]  guaiFENesin (MUCINEX) 600 MG 12 hr tablet Take 1 tablet (600 mg total) by mouth 2 (two) times daily. 03/16/22   Nita Sells, MD  ipratropium-albuterol  (DUONEB) 0.5-2.5 (3) MG/3ML SOLN Take 3 mLs by nebulization every 6 (six) hours as needed (shortness of breath). 02/10/22   [provider]  levothyroxine (SYNTHROID) 137 MCG tablet Take 137 mcg by mouth daily before breakfast. 03/14/20   [provider]  lidocaine (LIDODERM) 5 % Place 1 patch onto the skin daily. Remove & Discard patch within 12 hours or as directed by MD Patient not taking: Reported on 03/13/2022 03/11/22   Lacretia Leigh, MD  Magnesium Oxide 400 MG CAPS Take 1 capsule (400 mg total) by mouth daily at 12 noon. Patient taking differently: Take 400 mg by mouth daily. 03/01/22   Florencia Reasons, MD  meloxicam (MOBIC) 15 MG tablet Take 15 mg by mouth daily. 04/07/20   [provider]  methocarbamol (ROBAXIN) 500 MG tablet Take 500 mg by mouth 2 (two) times daily. 02/10/20   [provider]  Multiple Vitamins-Minerals (CENTRUM WOMEN) TABS Take 1 tablet by mouth daily.    [provider]  nitroGLYCERIN (NITROSTAT) 0.4 MG SL tablet Place 0.4 mg under the tongue every 5 (five) minutes as needed for chest pain.    [provider]  omeprazole (PRILOSEC) 20 MG capsule Take 20 mg by mouth daily. Takes an additional capsule if needed for acid reflux    [provider]  oxybutynin (DITROPAN) 5 MG tablet Take 5 mg by mouth 3 (three) times daily. 03/15/20   [provider]  OXYGEN Inhale 2 L into the  lungs daily as needed (for low oxygen).    [provider]  Propylene Glycol (SYSTANE COMPLETE OP) Place 1 drop into both eyes daily as needed (dry eyes).    [provider]  QUEtiapine (SEROQUEL) 300 MG tablet Take 300 mg by mouth at bedtime.    [provider]  rosuvastatin (CRESTOR) 20 MG tablet Take 20 mg by mouth daily. 01/15/20   [provider]  TRELEGY ELLIPTA 100-62.5-25 MCG/ACT AEPB Take 1 puff by mouth daily. 02/06/22   [provider]      Allergies    Red dye, Oxycontin [oxycodone hcl],  Prednisone, Aspirin, and Tape    Review of Systems   Review of Systems  Physical Exam Updated Vital Signs BP (!) 145/88 (BP Location: Right Arm)   Pulse (!) 101 Comment: Simultaneous filing. User may not have seen previous data.  Temp 99.6 F (37.6 C) (Oral)   Resp (!) 27   Ht 5\' 3"  (1.6 m)   Wt 46 kg   SpO2 97% Comment: Simultaneous filing. User may not have seen previous data.  BMI 17.96 kg/m  Physical Exam Vitals and nursing note reviewed.  Constitutional:      General: She is not in acute distress.    Appearance: She is well-developed. She is not ill-appearing or diaphoretic.     Comments: She is underweight  HENT:     Head: Normocephalic and atraumatic.     Right Ear: External ear normal.     Left Ear: External ear normal.  Eyes:     Conjunctiva/sclera: Conjunctivae normal.     Pupils: Pupils are equal, round, and reactive to light.  Neck:     Trachea: Phonation normal.  Cardiovascular:     Rate and Rhythm: Normal rate and regular rhythm.     Heart sounds: Normal heart sounds.  Pulmonary:     Effort: Pulmonary effort is normal. No respiratory distress.     Breath sounds: No stridor.     Comments: Decreased air movement bilaterally, scattered rhonchi. Chest:     Chest wall: No tenderness.  Abdominal:     General: There is no distension.     Palpations: Abdomen is soft.     Tenderness: There is no abdominal tenderness.  Musculoskeletal:        General: Normal range of motion.     Cervical back: Normal range of motion and neck supple.     Right lower leg: No edema.     Left lower leg: No edema.  Skin:    General: Skin is warm and dry.  Neurological:     Mental Status: She is alert and oriented to person, place, and time.     Cranial Nerves: No cranial nerve deficit.     Sensory: No sensory deficit.     Motor: No abnormal muscle tone.     Coordination: Coordination normal.  Psychiatric:        Mood and Affect: Mood normal.        Behavior: Behavior normal.         Thought Content: Thought content normal.        Judgment: Judgment normal.     ED Results / Procedures / Treatments   Labs (all labs ordered are listed, but only abnormal results are displayed) Labs Reviewed - No data to display  EKG None  Radiology No results found.  Procedures Procedures  {Document cardiac monitor, telemetry assessment procedure when appropriate:1}  Medications Ordered in ED Medications  albuterol (VENTOLIN HFA) 108 (90 Base) MCG/ACT inhaler 2 puff (has no administration in time range)    ED Course/ Medical Decision Making/ A&P                           Medical Decision Making Amount and/or Complexity of Data Reviewed Radiology: ordered.  Risk Prescription drug management.   ***  {Document critical care time when appropriate:1} {Document review of labs and clinical decision tools ie heart score, Chads2Vasc2 etc:1}  {Document your independent review of radiology images, and any outside records:1} {Document your discussion with family members, caretakers, and with consultants:1} {Document social determinants of health affecting pt's care:1} {Document your decision making why or why not admission, treatments were needed:1} Final Clinical Impression(s) / ED Diagnoses Final diagnoses:  None    Rx / DC Orders ED Discharge Orders     None

## 2022-03-20 ENCOUNTER — Other Ambulatory Visit (HOSPITAL_COMMUNITY): Payer: Self-pay

## 2022-03-20 ENCOUNTER — Emergency Department (HOSPITAL_COMMUNITY): Payer: 59

## 2022-03-20 ENCOUNTER — Emergency Department (HOSPITAL_COMMUNITY)
Admission: EM | Admit: 2022-03-20 | Discharge: 2022-03-20 | Disposition: A | Discharge status: Home / Self Care | Payer: 59 | Attending: Emergency Medicine | Admitting: Emergency Medicine

## 2022-03-20 ENCOUNTER — Other Ambulatory Visit: Payer: Self-pay

## 2022-03-20 ENCOUNTER — Emergency Department (HOSPITAL_COMMUNITY)
Admission: EM | Admit: 2022-03-20 | Discharge: 2022-03-21 | Disposition: A | Discharge status: Home / Self Care | Payer: 59 | Source: Home / Self Care | Attending: Emergency Medicine | Admitting: Emergency Medicine

## 2022-03-20 ENCOUNTER — Encounter (HOSPITAL_COMMUNITY): Payer: Self-pay

## 2022-03-20 DIAGNOSIS — J449 Chronic obstructive pulmonary disease, unspecified: Secondary | ICD-10-CM | POA: Insufficient documentation

## 2022-03-20 DIAGNOSIS — E039 Hypothyroidism, unspecified: Secondary | ICD-10-CM | POA: Insufficient documentation

## 2022-03-20 DIAGNOSIS — J188 Other pneumonia, unspecified organism: Secondary | ICD-10-CM | POA: Diagnosis not present

## 2022-03-20 DIAGNOSIS — Z7902 Long term (current) use of antithrombotics/antiplatelets: Secondary | ICD-10-CM | POA: Diagnosis not present

## 2022-03-20 DIAGNOSIS — Z85118 Personal history of other malignant neoplasm of bronchus and lung: Secondary | ICD-10-CM | POA: Insufficient documentation

## 2022-03-20 DIAGNOSIS — R519 Headache, unspecified: Secondary | ICD-10-CM | POA: Insufficient documentation

## 2022-03-20 DIAGNOSIS — J189 Pneumonia, unspecified organism: Secondary | ICD-10-CM

## 2022-03-20 DIAGNOSIS — F17210 Nicotine dependence, cigarettes, uncomplicated: Secondary | ICD-10-CM | POA: Insufficient documentation

## 2022-03-20 DIAGNOSIS — R0602 Shortness of breath: Secondary | ICD-10-CM | POA: Insufficient documentation

## 2022-03-20 DIAGNOSIS — R1031 Right lower quadrant pain: Secondary | ICD-10-CM | POA: Diagnosis not present

## 2022-03-20 DIAGNOSIS — Z9981 Dependence on supplemental oxygen: Secondary | ICD-10-CM | POA: Diagnosis not present

## 2022-03-20 LAB — CBC WITH DIFFERENTIAL/PLATELET
Abs Immature Granulocytes: 0.04 10*3/uL (ref 0.00–0.07)
Basophils Absolute: 0 10*3/uL (ref 0.0–0.1)
Basophils Relative: 0 %
Eosinophils Absolute: 0 10*3/uL (ref 0.0–0.5)
Eosinophils Relative: 0 %
HCT: 34.4 % — ABNORMAL LOW (ref 36.0–46.0)
Hemoglobin: 10.6 g/dL — ABNORMAL LOW (ref 12.0–15.0)
Immature Granulocytes: 0 %
Lymphocytes Relative: 19 %
Lymphs Abs: 2 10*3/uL (ref 0.7–4.0)
MCH: 28 pg (ref 26.0–34.0)
MCHC: 30.8 g/dL (ref 30.0–36.0)
MCV: 90.8 fL (ref 80.0–100.0)
Monocytes Absolute: 0.7 10*3/uL (ref 0.1–1.0)
Monocytes Relative: 6 %
Neutro Abs: 7.8 10*3/uL — ABNORMAL HIGH (ref 1.7–7.7)
Neutrophils Relative %: 75 %
Platelets: 424 10*3/uL — ABNORMAL HIGH (ref 150–400)
RBC: 3.79 MIL/uL — ABNORMAL LOW (ref 3.87–5.11)
RDW: 18.2 % — ABNORMAL HIGH (ref 11.5–15.5)
WBC: 10.7 10*3/uL — ABNORMAL HIGH (ref 4.0–10.5)
nRBC: 0 % (ref 0.0–0.2)

## 2022-03-20 LAB — COMPREHENSIVE METABOLIC PANEL
ALT: 15 U/L (ref 0–44)
AST: 21 U/L (ref 15–41)
Albumin: 2.9 g/dL — ABNORMAL LOW (ref 3.5–5.0)
Alkaline Phosphatase: 94 U/L (ref 38–126)
Anion gap: 11 (ref 5–15)
BUN: 9 mg/dL (ref 8–23)
CO2: 29 mmol/L (ref 22–32)
Calcium: 9 mg/dL (ref 8.9–10.3)
Chloride: 101 mmol/L (ref 98–111)
Creatinine, Ser: 0.42 mg/dL — ABNORMAL LOW (ref 0.44–1.00)
GFR, Estimated: >60 mL/min (ref >60)
Glucose, Bld: 114 mg/dL — ABNORMAL HIGH (ref 70–99)
Potassium: 4.6 mmol/L (ref 3.5–5.1)
Sodium: 141 mmol/L (ref 135–145)
Total Bilirubin: 0.6 mg/dL (ref 0.3–1.2)
Total Protein: 6.8 g/dL (ref 6.5–8.1)

## 2022-03-20 LAB — LIPASE, BLOOD: Lipase: 20 U/L (ref 11–51)

## 2022-03-20 LAB — TROPONIN I (HIGH SENSITIVITY)
Troponin I (High Sensitivity): 5 ng/L (ref <18)
Troponin I (High Sensitivity): 5 ng/L (ref <18)

## 2022-03-20 MED ORDER — IPRATROPIUM-ALBUTEROL 0.5-2.5 (3) MG/3ML IN SOLN
3.0000 mL | Freq: Once | RESPIRATORY_TRACT | Status: AC
  Administered 2022-03-20: 3 mL via RESPIRATORY_TRACT
  Filled 2022-03-20: qty 3

## 2022-03-20 MED ORDER — SODIUM CHLORIDE (PF) 0.9 % IJ SOLN
INTRAMUSCULAR | Status: AC
  Filled 2022-03-20: qty 50

## 2022-03-20 MED ORDER — AMOXICILLIN-POT CLAVULANATE ER 1000-62.5 MG PO TB12
2.0000 | ORAL_TABLET | Freq: Two times a day (BID) | ORAL | 0 refills | Status: DC
  Filled 2022-03-20: qty 48, 12d supply, fill #0

## 2022-03-20 MED ORDER — IOHEXOL 350 MG/ML SOLN
100.0000 mL | Freq: Once | INTRAVENOUS | Status: AC | PRN
  Administered 2022-03-20: 80 mL via INTRAVENOUS

## 2022-03-20 NOTE — ED Notes (Addendum)
Pt's O2 sat was 95 while ambulating with oxygen.

## 2022-03-20 NOTE — ED Notes (Signed)
Pt in scans. °

## 2022-03-20 NOTE — ED Triage Notes (Signed)
Pt presents to ED from home with c/o increased SOB that developed since being seen earlier. Pt also endorses abdominal pain, N/V/D, and generalized body aches and pain.

## 2022-03-20 NOTE — ED Notes (Signed)
Blood work has been collected and sent down to lab. Dark green, lavender, light green and blue tube have been collected.

## 2022-03-20 NOTE — ED Notes (Signed)
Pt attempting to find a ride home.

## 2022-03-20 NOTE — ED Triage Notes (Signed)
Ems brings pt in from home for shortness of breath. Pt was diagnosed with pneumonia yesterday. States she is not feeling better today. Pt reports not picking up prescriptions.

## 2022-03-20 NOTE — Discharge Planning (Signed)
Transition of Care [facility]) - Emergency Department Mini Assessment   Patient Details  Name: Tracy Park MRN: [id number] Date of Birth: 1955/10/06  Transition of Care [facility]) CM/SW Contact:    Roseanne Kaufman, RN Phone Number: 03/20/2022, 2:46 PM   Clinical Narrative: Patient presents to [facility] with c/o shortness of breath. RNCM received TOC consult for medication assistance. This RNCM spoke with [facility] pharmacy regarding status of Augmentin XR, [facility] pharmacy is unable to fill rx due to insurance indicates too soon to fill. [phone] who advised patient picked up her medications on 03/18/22. This RNCM notified RN Levada Dy, [facility] is clear for discharge.  This RNCM spoke with patient to advise her medications were picked up on 03/18/22, patient reports he daughter may have picked up her medications. This RNCM encouraged patient to contact her daughter.   No additional TOC needs at this time.  ED Mini Assessment: What brought you to the Emergency Department? : "I didn't pick up my meds."  Barriers to Discharge: Barriers Resolved  Barrier interventions: Provided instruction on home delivery  Means of departure: Car  Interventions which prevented an admission or readmission: Medication Review    Patient Contact and Communications    Patient: [phone]    Patient states their goals for this hospitalization and ongoing recovery are:: get my meds and go home      Admission diagnosis:  sepsis;shob Patient Active Problem List   Diagnosis Date Noted   Septic shock ([place]) 03/14/2022   Sepsis due to pneumonia ([place]) 03/13/2022   Dyslipidemia 03/13/2022   Anxiety and depression 03/13/2022   History of lung cancer 03/13/2022   Tobacco abuse 03/13/2022   Pneumonia 02/27/2022   Adenocarcinoma of lung ([place]) 02/26/2022   Leukocytosis 02/26/2022   COPD with acute exacerbation ([place]) 02/26/2022   Chronic respiratory failure with hypoxia ([place])  02/26/2022   Protein-calorie malnutrition, severe 08/25/2021   Influenza A with pneumonia 08/16/2021   Sepsis ([place]) 08/16/2021   Hyponatremia 08/16/2021   Hypokalemia 08/16/2021   Hypomagnesemia 08/16/2021   Hydronephrosis 05/03/2021   Acute encephalopathy 05/02/2021   CAP (community acquired pneumonia) 04/10/2020   GERD (gastroesophageal reflux disease) 04/28/2012   Hypothyroidism (acquired) 04/28/2012   Nicotine abuse 04/28/2012   Depression 04/28/2012   Chest pain, atypical 04/28/2012   PCP:  Irving Shows, PA-C Pharmacy:   [id number] [address] [place] [address] Phone: [phone] Fax: [place] [place] [address] Phone: [phone] Fax: [phone]

## 2022-03-20 NOTE — ED Notes (Signed)
Pt's SPO2 89% on RA. Pt endorses using home O2 2 L/min. Pt placed on 2 L [place], SPO2 improved to 95%.

## 2022-03-20 NOTE — ED Provider Triage Note (Signed)
Emergency Medicine Provider Triage Evaluation Note  Tracy Park , a 66 y.o. female  was evaluated in triage.  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 Pt complains of shortness of breath and nausea.   Reviewed lab work-up performed earlier today at 8:27 AM.  No concerning trends.  Patient had CT of the abdomen pelvis as well as CT angiography of the chest performed this morning at 10 AM.  Showed persistent pneumonia.  Review of Systems  Positive: Shortness of breath Negative: Fevers  Physical Exam  BP (!) 157/98 (BP Location: Left Arm)   Pulse (!) 101   Temp 98.2 F (36.8 C) (Oral)   Resp 19   Ht 5\' 3"  (1.6 m)   Wt 45.4 kg   SpO2 90%   BMI 17.71 kg/m  Gen:   Awake, tearful Resp:  Normal effort  MSK:   Moves extremities without difficulty  Other:    Medical Decision Making  Medically screening exam initiated at 8:55 PM.  Appropriate orders placed.  Tracy Park was informed that the remainder of the evaluation will be completed by another provider, this initial triage assessment does not replace that evaluation, and the importance of remaining in the ED until their evaluation is complete.     Carlisle Cater, PA-C 03/20/22 2058

## 2022-03-20 NOTE — Discharge Instructions (Addendum)
Take the antibiotics that were filled by this pharmacy.  Your CT scan does show a lung nodule need to follow-up in 12 months to make sure is not changing in size follow-up with your doctor.  Continue medications as prescribed.  Return to the ED with difficulty breathing, chest pain, shortness of breath or any concerns.

## 2022-03-20 NOTE — ED Notes (Signed)
Pt had ginger ale and sandwich

## 2022-03-20 NOTE — ED Notes (Signed)
Social work on the way to see pt

## 2022-03-20 NOTE — ED Provider Notes (Signed)
[place] DEPT Provider Note   CSN: [id number] Arrival date & time: 03/20/22  0735     History  Chief Complaint  Patient presents with   Shortness of Breath    Mulhall is a 66 y.o. female.  66 year old female with left upper lobe adenocarcinoma status post upper lobectomy 2019--follows with [facility] Dr. [name] COPD chronic respiratory failure on 2 L oxygen baseline--- still smoker 2 to 3 cigarettes daily She is here with shortness of breath and difficulty breathing for the past 2 days.  She was seen yesterday for the same.  She was discharged on Augmentin on July 8 which she has not yet picked up from the pharmacy.  States she has no transportation.  She is still having difficulty breathing and coughing productive of clear mucus.  Today she with right-sided lower abdominal pain with mild episodes of vomiting.  Denies fever.  Has not had increase her home oxygen.  No leg pain or leg swelling.  States she used to using her inhalers but has not picked up the Augmentin she was discharged with.  Also complains of aches all over as well as a headache.  Does not think that she had a fever.  Abdominal pain this is new since yesterday.  The history is provided by the patient.  Shortness of Breath Associated symptoms: abdominal pain, cough, headaches, sore throat and vomiting   Associated symptoms: no rash        Home Medications Prior to Admission medications   Medication Sig Start Date End Date Taking? Authorizing Provider  acetaminophen (TYLENOL) 325 MG tablet Take 650 mg by mouth every 6 (six) hours as needed for moderate pain or headache.    [provider]  acetaminophen (TYLENOL) 325 MG tablet Take 2 tablets (650 mg total) by mouth every 6 (six) hours as needed for mild pain (or Fever >/= 101). 03/16/22   Nita Sells, MD  amoxicillin-clavulanate (AUGMENTIN XR) 1000-62.5 MG 12 hr tablet Take 1 tablet by mouth 2 (two) times  daily for 12 days. 03/16/22 03/28/22  Nita Sells, MD  amoxicillin-clavulanate (AUGMENTIN) 875-125 MG tablet Take 1 tablet by mouth every 12 (twelve) hours. 03/19/22   Daleen Bo, MD  busPIRone (BUSPAR) 5 MG tablet Take 5 mg by mouth 3 (three) times daily.    [provider]  famotidine (PEPCID) 40 MG tablet Take 40 mg by mouth daily. 02/14/20   [provider]  guaiFENesin (MUCINEX) 600 MG 12 hr tablet Take 1 tablet (600 mg total) by mouth 2 (two) times daily. 03/16/22   Nita Sells, MD  ipratropium-albuterol (DUONEB) 0.5-2.5 (3) MG/3ML SOLN Take 3 mLs by nebulization every 6 (six) hours as needed (shortness of breath). 02/10/22   [provider]  levothyroxine (SYNTHROID) 137 MCG tablet Take 137 mcg by mouth daily before breakfast. 03/14/20   [provider]  lidocaine (LIDODERM) 5 % Place 1 patch onto the skin daily. Remove & Discard patch within 12 hours or as directed by MD Patient not taking: Reported on 03/13/2022 03/11/22   Lacretia Leigh, MD  Magnesium Oxide 400 MG CAPS Take 1 capsule (400 mg total) by mouth daily at 12 noon. Patient taking differently: Take 400 mg by mouth daily. 03/01/22   Florencia Reasons, MD  meloxicam (MOBIC) 15 MG tablet Take 15 mg by mouth daily. 04/07/20   [provider]  methocarbamol (ROBAXIN) 500 MG tablet Take 500 mg by mouth 2 (two) times daily. 02/10/20   [provider]  Multiple Vitamins-Minerals (CENTRUM WOMEN) TABS Take 1 tablet by mouth daily.    [provider]  nitroGLYCERIN (NITROSTAT) 0.4 MG SL tablet Place 0.4 mg under the tongue every 5 (five) minutes as needed for chest pain.    [provider]  omeprazole (PRILOSEC) 20 MG capsule Take 20 mg by mouth daily. Takes an additional capsule if needed for acid reflux    [provider]  oxybutynin (DITROPAN) 5 MG tablet Take 5 mg by mouth 3 (three) times daily. 03/15/20   [provider]  OXYGEN Inhale 2 L into the lungs  daily as needed (for low oxygen).    [provider]  Propylene Glycol (SYSTANE COMPLETE OP) Place 1 drop into both eyes daily as needed (dry eyes).    [provider]  QUEtiapine (SEROQUEL) 300 MG tablet Take 300 mg by mouth at bedtime.    [provider]  rosuvastatin (CRESTOR) 20 MG tablet Take 20 mg by mouth daily. 01/15/20   [provider]  TRELEGY ELLIPTA 100-62.5-25 MCG/ACT AEPB Take 1 puff by mouth daily. 02/06/22   [provider]      Allergies    Red dye, Oxycontin [oxycodone hcl], Prednisone, Aspirin, and Tape    Review of Systems   Review of Systems  Constitutional:  Positive for activity change and appetite change.  HENT:  Positive for congestion and sore throat.   Respiratory:  Positive for cough and shortness of breath.   Gastrointestinal:  Positive for abdominal pain, nausea and vomiting.  Genitourinary:  Negative for dysuria and hematuria.  Musculoskeletal:  Positive for arthralgias and myalgias.  Skin:  Negative for rash.  Neurological:  Positive for weakness and headaches.   all other systems are negative except as noted in the HPI and PMH.    Physical Exam Updated Vital Signs BP 128/89   Pulse 98   Temp 97.7 F (36.5 C) (Oral)   Resp 20   Ht 5\' 3"  (1.6 m)   Wt 45.4 kg   SpO2 99%   BMI 17.71 kg/m  Physical Exam Vitals and nursing note reviewed.  Constitutional:      General: She is not in acute distress.    Appearance: She is well-developed.     Comments: Speaking in full sentences, no distress  HENT:     Head: Normocephalic and atraumatic.     Mouth/Throat:     Pharynx: No oropharyngeal exudate.  Eyes:     Conjunctiva/sclera: Conjunctivae normal.     Pupils: Pupils are equal, round, and reactive to light.  Neck:     Comments: No meningismus. Cardiovascular:     Rate and Rhythm: Normal rate and regular rhythm.     Heart sounds: Normal heart sounds. No murmur heard. Pulmonary:     Effort: Pulmonary  effort is normal. No respiratory distress.     Breath sounds: Wheezing present.     Comments: Mild tachypnea Abdominal:     Palpations: Abdomen is soft.     Tenderness: There is no abdominal tenderness. There is no guarding or rebound.  Musculoskeletal:        General: No tenderness. Normal range of motion.     Cervical back: Normal range of motion and neck supple.     Right lower leg: No edema.     Left lower leg: No edema.  Skin:    General: Skin is warm.  Neurological:     Mental Status: She is alert and oriented to  person, place, and time.     Cranial Nerves: No cranial nerve deficit.     Motor: No abnormal muscle tone.     Coordination: Coordination normal.     Comments:  5/5 strength throughout. CN 2-12 intact.Equal grip strength.   Psychiatric:        Behavior: Behavior normal.     ED Results / Procedures / Treatments   Labs (all labs ordered are listed, but only abnormal results are displayed) Labs Reviewed  CBC WITH DIFFERENTIAL/PLATELET - Abnormal; Notable for the following components:      Result Value   WBC 10.7 (*)    RBC 3.79 (*)    Hemoglobin 10.6 (*)    HCT 34.4 (*)    RDW 18.2 (*)    Platelets 424 (*)    Neutro Abs 7.8 (*)    All other components within normal limits  COMPREHENSIVE METABOLIC PANEL - Abnormal; Notable for the following components:   Glucose, Bld 114 (*)    Creatinine, Ser 0.42 (*)    Albumin 2.9 (*)    All other components within normal limits  LIPASE, BLOOD  URINALYSIS, ROUTINE W REFLEX MICROSCOPIC  TROPONIN I (HIGH SENSITIVITY)  TROPONIN I (HIGH SENSITIVITY)    EKG EKG Interpretation  Date/Time:  Wednesday March 20 2022 08:24:45 EDT Ventricular Rate:  91 PR Interval:  132 QRS Duration: 100 QT Interval:  382 QTC Calculation: 470 R Axis:   63 Text Interpretation: Sinus rhythm Right atrial enlargement Consider right ventricular hypertrophy Nonspecific T abnrm, anterolateral leads No significant change was found Confirmed by  Ezequiel Essex [phone]) on 03/20/2022 8:41:24 AM  Radiology CT Angio Chest PE W and/or Wo Contrast  Result Date: 03/20/2022 CLINICAL DATA:  Abdominal pain.  Shortness of breath. EXAM: CT ANGIOGRAPHY CHEST CT ABDOMEN AND PELVIS WITH CONTRAST TECHNIQUE: Multidetector CT imaging of the chest was performed using the standard protocol during bolus administration of intravenous contrast. Multiplanar CT image reconstructions and MIPs were obtained to evaluate the vascular anatomy. Multidetector CT imaging of the abdomen and pelvis was performed using the standard protocol during bolus administration of intravenous contrast. RADIATION DOSE REDUCTION: This exam was performed according to the departmental dose-optimization program which includes automated exposure control, adjustment of the mA and/or kV according to patient size and/or use of iterative reconstruction technique. CONTRAST:  85mL OMNIPAQUE IOHEXOL 350 MG/ML SOLN COMPARISON:  02/25/2022, 03/13/2022 FINDINGS: CTA CHEST FINDINGS Cardiovascular: Satisfactory opacification of the pulmonary arteries to the segmental level. No evidence of pulmonary embolism. Normal heart size. No pericardial effusion. Thoracic aortic atherosclerosis. Coronary artery atherosclerosis. Mediastinum/Nodes: No enlarged mediastinal, hilar, or axillary lymph nodes. Thyroid gland, trachea, and esophagus demonstrate no significant findings. Lungs/Pleura: Centrilobular emphysema. Persistent left apical consolidation similar to 03/13/2022. Postsurgical changes in the left upper lobe. No other areas of focal consolidation. No pleural effusion or pneumothorax. 5 mm right upper lobe pulmonary nodule. Musculoskeletal: No acute osseous abnormality. No aggressive osseous lesion. Review of the MIP images confirms the above findings. CT ABDOMEN and PELVIS FINDINGS Hepatobiliary: No focal liver abnormality is seen. No gallstones, gallbladder wall thickening, or biliary dilatation. Hydropic  gallbladder. Pancreas: Unremarkable. No pancreatic ductal dilatation or surrounding inflammatory changes. Spleen: Normal in size without focal abnormality. Adrenals/Urinary Tract: Adrenal glands are unremarkable. Punctate nonobstructing right renal calculus. Right extrarenal pelvis. No obstructive uropathy. No renal mass. Stomach/Bowel: Stomach is within normal limits. No evidence of bowel wall thickening, distention, or inflammatory changes. Large amount of stool throughout the colon. Vascular/Lymphatic: Aortic atherosclerosis. No  enlarged abdominal or pelvic lymph nodes. Reproductive: Status post hysterectomy. No adnexal masses. Other: No abdominal wall hernia or abnormality. No abdominopelvic ascites. Musculoskeletal: No acute osseous abnormality. No aggressive osseous lesion. Grade 1 anterolisthesis of L5 on S1. Severe bilateral facet arthropathy at L5-S1. Review of the MIP images confirms the above findings. IMPRESSION: 1. No pulmonary embolism to the segmental level. 2. Persistent left apical consolidation similar to 03/13/2022 consistent with persistent infectious etiology. 3. A 5 mm right upper lobe pulmonary nodule. A non-contrast Chest CT at 12 months is optional. If performed and the nodule is stable at 12 months, no further follow-up is recommended. These guidelines do not apply to immunocompromised patients and patients with cancer. Follow up in patients with significant comorbidities as clinically warranted. For lung cancer screening, adhere to Lung-RADS guidelines. Reference: Radiology. 2017; 284(1):228-43. 4. Large amount of stool throughout the colon. 5. Punctate nonobstructing right renal calculus. 6. Aortic Atherosclerosis (ICD10-I70.0) and Emphysema (ICD10-J43.9). Electronically Signed   By: Kathreen Devoid M.D.   On: 03/20/2022 10:57   CT ABDOMEN PELVIS W CONTRAST  Result Date: 03/20/2022 CLINICAL DATA:  Abdominal pain.  Shortness of breath. EXAM: CT ANGIOGRAPHY CHEST CT ABDOMEN AND PELVIS WITH  CONTRAST TECHNIQUE: Multidetector CT imaging of the chest was performed using the standard protocol during bolus administration of intravenous contrast. Multiplanar CT image reconstructions and MIPs were obtained to evaluate the vascular anatomy. Multidetector CT imaging of the abdomen and pelvis was performed using the standard protocol during bolus administration of intravenous contrast. RADIATION DOSE REDUCTION: This exam was performed according to the departmental dose-optimization program which includes automated exposure control, adjustment of the mA and/or kV according to patient size and/or use of iterative reconstruction technique. CONTRAST:  22mL OMNIPAQUE IOHEXOL 350 MG/ML SOLN COMPARISON:  02/25/2022, 03/13/2022 FINDINGS: CTA CHEST FINDINGS Cardiovascular: Satisfactory opacification of the pulmonary arteries to the segmental level. No evidence of pulmonary embolism. Normal heart size. No pericardial effusion. Thoracic aortic atherosclerosis. Coronary artery atherosclerosis. Mediastinum/Nodes: No enlarged mediastinal, hilar, or axillary lymph nodes. Thyroid gland, trachea, and esophagus demonstrate no significant findings. Lungs/Pleura: Centrilobular emphysema. Persistent left apical consolidation similar to 03/13/2022. Postsurgical changes in the left upper lobe. No other areas of focal consolidation. No pleural effusion or pneumothorax. 5 mm right upper lobe pulmonary nodule. Musculoskeletal: No acute osseous abnormality. No aggressive osseous lesion. Review of the MIP images confirms the above findings. CT ABDOMEN and PELVIS FINDINGS Hepatobiliary: No focal liver abnormality is seen. No gallstones, gallbladder wall thickening, or biliary dilatation. Hydropic gallbladder. Pancreas: Unremarkable. No pancreatic ductal dilatation or surrounding inflammatory changes. Spleen: Normal in size without focal abnormality. Adrenals/Urinary Tract: Adrenal glands are unremarkable. Punctate nonobstructing right renal  calculus. Right extrarenal pelvis. No obstructive uropathy. No renal mass. Stomach/Bowel: Stomach is within normal limits. No evidence of bowel wall thickening, distention, or inflammatory changes. Large amount of stool throughout the colon. Vascular/Lymphatic: Aortic atherosclerosis. No enlarged abdominal or pelvic lymph nodes. Reproductive: Status post hysterectomy. No adnexal masses. Other: No abdominal wall hernia or abnormality. No abdominopelvic ascites. Musculoskeletal: No acute osseous abnormality. No aggressive osseous lesion. Grade 1 anterolisthesis of L5 on S1. Severe bilateral facet arthropathy at L5-S1. Review of the MIP images confirms the above findings. IMPRESSION: 1. No pulmonary embolism to the segmental level. 2. Persistent left apical consolidation similar to 03/13/2022 consistent with persistent infectious etiology. 3. A 5 mm right upper lobe pulmonary nodule. A non-contrast Chest CT at 12 months is optional. If performed and the nodule is stable at 12  months, no further follow-up is recommended. These guidelines do not apply to immunocompromised patients and patients with cancer. Follow up in patients with significant comorbidities as clinically warranted. For lung cancer screening, adhere to Lung-RADS guidelines. Reference: Radiology. 2017; 284(1):228-43. 4. Large amount of stool throughout the colon. 5. Punctate nonobstructing right renal calculus. 6. Aortic Atherosclerosis (ICD10-I70.0) and Emphysema (ICD10-J43.9). Electronically Signed   By: Kathreen Devoid M.D.   On: 03/20/2022 10:57   DG Chest 2 View  Result Date: 03/20/2022 CLINICAL DATA:  66 year old female with shortness of breath. Chronic lung disease and history of lung cancer. EXAM: CHEST - 2 VIEW COMPARISON:  Chest radiograph 03/19/2022, CTA 03/13/2022 and earlier. FINDINGS: Semi upright AP and lateral views of the chest. Left upper lung volume loss, architectural distortion and combined bronchiectasis and consolidation appears  stable from last month. Stable leftward shift of the mediastinum and distorted superior mediastinal contours. Stable trachea. Underlying emphysema as demonstrated by CT. No pneumothorax or pleural effusion. No convincing acute pulmonary opacity. No acute osseous abnormality identified. Negative visible bowel gas. IMPRESSION: Chronic lung disease with unresolved left apical consolidation, emphysema. No new cardiopulmonary abnormality. Electronically Signed   By: Genevie Ann M.D.   On: 03/20/2022 08:26   DG Chest 2 View  Result Date: 03/19/2022 CLINICAL DATA:  Shortness of breath EXAM: CHEST - 2 VIEW COMPARISON:  Chest x-ray 03/13/2022.  Chest CT 03/13/2022. FINDINGS: Left apical pleural thickening and patchy opacities are unchanged. Cardiomediastinal silhouette is stable. No new focal lung infiltrate, pleural effusion or pneumothorax. No acute fractures. IMPRESSION: Stable left upper lobe pleural-parenchymal opacities. No acute lung infiltrate. Electronically Signed   By: Ronney Asters M.D.   On: 03/19/2022 19:07    Procedures Procedures    Medications Ordered in ED Medications  ipratropium-albuterol (DUONEB) 0.5-2.5 (3) MG/3ML nebulizer solution 3 mL (has no administration in time range)    ED Course/ Medical Decision Making/ A&P                           Medical Decision Making Amount and/or Complexity of Data Reviewed Labs: ordered. Radiology: ordered and independent interpretation performed. Decision-making details documented in ED Course. ECG/medicine tests: ordered and independent interpretation performed. Decision-making details documented in ED Course.  Risk Prescription drug management.  Difficulty breathing with body aches and headache.  Seen yesterday for same.  Did not pick up her medication she was discharged with on July 8.  Has new abdominal pain and vomiting since yesterday.  On arrival patient has no increased work of breathing or hypoxia on her home oxygen.  X-ray shows  stable left upper lobe opacity.  Results reviewed and interpreted by me.  Labs are reassuring.  Troponin negative x1.  Given her new lower abdominal pain, CT imaging was obtained to rule out appendicitis.  Appendix not identified on CT. D/w Dr. Posey Pronto.  No acute intra-abdominal findings.  CT scan does show recurrent left upper lobe pneumonia as well as lung nodule.  No pulmonary embolism  Labs appear to be at baseline.  Stable anemia.  Troponin negative x2.  Low suspicion for pulmonary embolism or ACS.  Discussed with case Freight forwarder and social work.  Patient unable to pick up her antibiotics due to transportation issues.  We will arrange to get them filled at the [facility]  She is able to ambulate without desaturation on her home O2. Tolerating PO. States she has her other medications and just needs the  augmentin.  After social work investigation, it appears patient's daughter may have picked up medications. Encouraged her to get the prescriptions and take her medications.   No indication for rehospitalization today.  Return precautions discussed.         Final Clinical Impression(s) / ED Diagnoses Final diagnoses:  Postobstructive pneumonia  Chronic obstructive pulmonary disease, unspecified COPD type [facility])    Rx / DC Orders ED Discharge Orders     None         Mindi Akerson, Annie Main, MD 03/20/22 1826

## 2022-03-20 NOTE — Progress Notes (Signed)
Transition of Care [facility]) - Emergency Department Mini Assessment   Patient Details  Name: Tracy Park MRN: [id number] Date of Birth: Jun 16, 1956  Transition of Care [facility]) CM/SW Contact:    Fuller Mandril, RN Phone Number: 03/20/2022, 9:19 AM   Clinical Narrative: Pt returned to ED because she did not pick up Rx after discharge and has transportation issues.  RNCM contacted her pharmacy to inquire about home delivery of Rx.  Pharmacy informed that pt may download app and request same day delivery and sign up for monthly delivery.  RNCM relayed information to EDSW.   ED Mini Assessment: What brought you to the Emergency Department? : (P) "I didn't pick up my meds."  Barriers to Discharge: (P) Barriers Resolved  Barrier interventions: (P) Provided instruction on home delivery  Means of departure: (P) Car  Interventions which prevented an admission or readmission: (P) Medication Review    Patient Contact and Communications        ,          Patient states their goals for this hospitalization and ongoing recovery are:: (P) get my meds and go home      Admission diagnosis:  sepsis;shob Patient Active Problem List   Diagnosis Date Noted   Septic shock ([place]) 03/14/2022   Sepsis due to pneumonia ([place]) 03/13/2022   Dyslipidemia 03/13/2022   Anxiety and depression 03/13/2022   History of lung cancer 03/13/2022   Tobacco abuse 03/13/2022   Pneumonia 02/27/2022   Adenocarcinoma of lung ([place]) 02/26/2022   Leukocytosis 02/26/2022   COPD with acute exacerbation ([place]) 02/26/2022   Chronic respiratory failure with hypoxia ([place]) 02/26/2022   Protein-calorie malnutrition, severe 08/25/2021   Influenza A with pneumonia 08/16/2021   Sepsis ([place]) 08/16/2021   Hyponatremia 08/16/2021   Hypokalemia 08/16/2021   Hypomagnesemia 08/16/2021   Hydronephrosis 05/03/2021   Acute encephalopathy 05/02/2021   CAP (community acquired pneumonia) 04/10/2020   GERD (gastroesophageal reflux  disease) 04/28/2012   Hypothyroidism (acquired) 04/28/2012   Nicotine abuse 04/28/2012   Depression 04/28/2012   Chest pain, atypical 04/28/2012   PCP:  Irving Shows, PA-C Pharmacy:   [id number] - [place], [address] [place] [address] Phone: [phone] Fax: [place]. [place] [address] Phone: [phone] Fax: [phone]

## 2022-03-20 NOTE — ED Notes (Signed)
Urine requested  ?

## 2022-03-21 ENCOUNTER — Other Ambulatory Visit (HOSPITAL_COMMUNITY): Payer: Self-pay

## 2022-03-21 ENCOUNTER — Emergency Department (HOSPITAL_COMMUNITY): Payer: 59

## 2022-03-21 DIAGNOSIS — J188 Other pneumonia, unspecified organism: Secondary | ICD-10-CM | POA: Diagnosis not present

## 2022-03-21 MED ORDER — PROCHLORPERAZINE MALEATE 10 MG PO TABS
10.0000 mg | ORAL_TABLET | Freq: Once | ORAL | Status: AC
  Administered 2022-03-21: 10 mg via ORAL
  Filled 2022-03-21: qty 1

## 2022-03-21 NOTE — ED Notes (Signed)
Pt O2 stats at 87. Pt placed on 2L.

## 2022-03-21 NOTE — Discharge Instructions (Signed)
You were evaluated in the Emergency Department and after careful evaluation, we did not find any emergent condition requiring admission or further testing in the hospital.  Your exam/testing today was overall reassuring.  Please return to the Emergency Department if you experience any worsening of your condition.  Thank you for allowing us to be a part of your care.  

## 2022-03-21 NOTE — ED Provider Notes (Signed)
[facility] Emergency Department Provider Note MRN:  [id number]  Arrival date & time: 03/21/22     Chief Complaint   Shortness of Breath   History of Present Illness   Tracy Park is a 66 y.o. year-old female with a history of COPD, lung cancer presenting to the ED with chief complaint of shortness of breath.  Continued shortness of breath, a bit worse this evening.  No chest pain, complaining of headache as well.  Multiple ED visits recently for the same.  Review of Systems  A thorough review of systems was obtained and all systems are negative except as noted in the HPI and PMH.   Patient's Health History    Past Medical History:  Diagnosis Date   Anginal pain ([place])    Anxiety    Bipolar disorder ([place])    Cancer ([place])    COPD (chronic obstructive pulmonary disease) ([place])    Dyspnea    Family history of adverse reaction to anesthesia    History of kidney stones    Hydroureteronephrosis 08/16/2021   Hypothyroidism    Lung cancer ([place])    Myocardial infarction ([place])    PTSD (post-traumatic stress disorder)    Sleep apnea    Thyroid disease     Past Surgical History:  Procedure Laterality Date   ABDOMINAL HYSTERECTOMY     BACK SURGERY     CYSTOSCOPY W/ URETERAL STENT PLACEMENT Right 05/03/2021   Procedure: CYSTOSCOPY WITH RETROGRADE PYELOGRAM/URETERAL STENT PLACEMENT;  Surgeon: Ardis Hughs, MD;  Location: [facility];  Service: Urology;  Laterality: Right;   EYE SURGERY     kidney stent     thyroidectomy      Family History  Family history unknown: Yes    Social History   Socioeconomic History   Marital status: Divorced    Spouse name: Not on file   Number of children: Not on file   Years of education: Not on file   Highest education level: Not on file  Occupational History   Not on file  Tobacco Use   Smoking status: Every Day    Packs/day: 0.15    Types: Cigarettes   Smokeless tobacco: Never  Vaping Use   Vaping Use:  Never used  Substance and Sexual Activity   Alcohol use: Not Currently   Drug use: Not Currently   Sexual activity: Not on file  Other Topics Concern   Not on file  Social History Narrative   Not on file   Social Determinants of Health   Financial Resource Strain: Not on file  Food Insecurity: Not on file  Transportation Needs: Not on file  Physical Activity: Not on file  Stress: Not on file  Social Connections: Not on file  Intimate Partner Violence: Not on file     Physical Exam   Vitals:   03/21/22 0200 03/21/22 0230  BP: 128/80 133/81  Pulse: 81 68  Resp: (!) 23 16  Temp:    SpO2:  98%    CONSTITUTIONAL: Chronically ill-appearing, NAD NEURO/PSYCH:  Alert and oriented x 3, no focal deficits EYES:  eyes equal and reactive ENT/NECK:  no LAD, no JVD CARDIO: Regular rate, well-perfused, normal S1 and S2 PULM:  CTAB no wheezing or rhonchi GI/GU:  non-distended, non-tender MSK/SPINE:  No gross deformities, no edema SKIN:  no rash, atraumatic   *Additional and/or pertinent findings included in MDM below  Diagnostic and Interventional Summary    EKG Interpretation  Date/Time:  Ventricular Rate:    PR Interval:    QRS Duration:   QT Interval:    QTC Calculation:   R Axis:     Text Interpretation:         Labs Reviewed - No data to display  DG Chest Port 1 View  Final Result      Medications  prochlorperazine (COMPAZINE) tablet 10 mg (10 mg Oral Given 03/21/22 0236)     Procedures  /  Critical Care Procedures  ED Course and Medical Decision Making  Initial Impression and Ddx Patient sitting comfortably in no acute distress, no significant wheezes on exam, no increased work of breathing.  This is the patient's ninth ED visit in less than 1 month.  Suspect some type of social component to these ED visits.  Labs yesterday reassuring with negative troponin x2, overall reassuring chest x-ray.  Will provide headache medicine, recapture an x-ray to screen  for any significant changes, reassess.  Past medical/surgical history that increases complexity of ED encounter: COPD  Interpretation of Diagnostics I personally reviewed the chest x-ray and my interpretation is as follows: No significant changes    Patient Reassessment and Ultimate Disposition/Management     Appropriate for discharge.  Patient management required discussion with the following services or consulting groups:  None  Complexity of Problems Addressed Acute illness or injury that poses threat of life of bodily function  Additional Data Reviewed and Analyzed Further history obtained from: Prior labs/imaging results  Additional Factors Impacting ED Encounter Risk None  Barth Kirks. Sedonia Small, MD [place] [email or web address]  Final Clinical Impressions(s) / ED Diagnoses     ICD-10-CM   1. SOB (shortness of breath)  R06.02       ED Discharge Orders     None        Discharge Instructions Discussed with and Provided to Patient:     Discharge Instructions      You were evaluated in the Emergency Department and after careful evaluation, we did not find any emergent condition requiring admission or further testing in the hospital.  Your exam/testing today was overall reassuring.  Please return to the Emergency Department if you experience any worsening of your condition.  Thank you for allowing [place] to be a part of your care.        Maudie Flakes, MD 03/21/22 [phone]

## 2022-03-24 ENCOUNTER — Emergency Department (HOSPITAL_COMMUNITY): Payer: 59

## 2022-03-24 ENCOUNTER — Other Ambulatory Visit: Payer: Self-pay

## 2022-03-24 ENCOUNTER — Inpatient Hospital Stay (HOSPITAL_COMMUNITY)
Admission: EM | Admit: 2022-03-24 | Discharge: 2022-03-28 | DRG: 190 | Disposition: A | Discharge status: Home / Self Care | Payer: 59 | Attending: Internal Medicine | Admitting: Internal Medicine

## 2022-03-24 ENCOUNTER — Encounter (HOSPITAL_COMMUNITY): Payer: Self-pay | Admitting: Emergency Medicine

## 2022-03-24 DIAGNOSIS — Z720 Tobacco use: Secondary | ICD-10-CM | POA: Diagnosis present

## 2022-03-24 DIAGNOSIS — D638 Anemia in other chronic diseases classified elsewhere: Secondary | ICD-10-CM | POA: Diagnosis present

## 2022-03-24 DIAGNOSIS — M5030 Other cervical disc degeneration, unspecified cervical region: Secondary | ICD-10-CM | POA: Insufficient documentation

## 2022-03-24 DIAGNOSIS — K219 Gastro-esophageal reflux disease without esophagitis: Secondary | ICD-10-CM | POA: Diagnosis present

## 2022-03-24 DIAGNOSIS — F17210 Nicotine dependence, cigarettes, uncomplicated: Secondary | ICD-10-CM | POA: Diagnosis present

## 2022-03-24 DIAGNOSIS — I2510 Atherosclerotic heart disease of native coronary artery without angina pectoris: Secondary | ICD-10-CM | POA: Diagnosis present

## 2022-03-24 DIAGNOSIS — D75839 Thrombocytosis, unspecified: Secondary | ICD-10-CM

## 2022-03-24 DIAGNOSIS — J9621 Acute and chronic respiratory failure with hypoxia: Secondary | ICD-10-CM | POA: Diagnosis present

## 2022-03-24 DIAGNOSIS — F319 Bipolar disorder, unspecified: Secondary | ICD-10-CM | POA: Diagnosis present

## 2022-03-24 DIAGNOSIS — Z9221 Personal history of antineoplastic chemotherapy: Secondary | ICD-10-CM

## 2022-03-24 DIAGNOSIS — J441 Chronic obstructive pulmonary disease with (acute) exacerbation: Principal | ICD-10-CM

## 2022-03-24 DIAGNOSIS — D649 Anemia, unspecified: Secondary | ICD-10-CM | POA: Diagnosis present

## 2022-03-24 DIAGNOSIS — F3181 Bipolar II disorder: Secondary | ICD-10-CM | POA: Insufficient documentation

## 2022-03-24 DIAGNOSIS — E039 Hypothyroidism, unspecified: Secondary | ICD-10-CM | POA: Diagnosis present

## 2022-03-24 DIAGNOSIS — Z79890 Hormone replacement therapy (postmenopausal): Secondary | ICD-10-CM

## 2022-03-24 DIAGNOSIS — Z902 Acquired absence of lung [part of]: Secondary | ICD-10-CM

## 2022-03-24 DIAGNOSIS — R0602 Shortness of breath: Secondary | ICD-10-CM | POA: Diagnosis not present

## 2022-03-24 DIAGNOSIS — J4489 Other specified chronic obstructive pulmonary disease: Secondary | ICD-10-CM | POA: Diagnosis present

## 2022-03-24 DIAGNOSIS — Z79899 Other long term (current) drug therapy: Secondary | ICD-10-CM

## 2022-03-24 DIAGNOSIS — B370 Candidal stomatitis: Secondary | ICD-10-CM | POA: Diagnosis present

## 2022-03-24 DIAGNOSIS — E785 Hyperlipidemia, unspecified: Secondary | ICD-10-CM | POA: Diagnosis present

## 2022-03-24 DIAGNOSIS — Z85118 Personal history of other malignant neoplasm of bronchus and lung: Secondary | ICD-10-CM

## 2022-03-24 DIAGNOSIS — Z90710 Acquired absence of both cervix and uterus: Secondary | ICD-10-CM

## 2022-03-24 DIAGNOSIS — F4310 Post-traumatic stress disorder, unspecified: Secondary | ICD-10-CM | POA: Diagnosis present

## 2022-03-24 DIAGNOSIS — Z20822 Contact with and (suspected) exposure to covid-19: Secondary | ICD-10-CM | POA: Diagnosis present

## 2022-03-24 DIAGNOSIS — D72829 Elevated white blood cell count, unspecified: Secondary | ICD-10-CM | POA: Diagnosis present

## 2022-03-24 DIAGNOSIS — I252 Old myocardial infarction: Secondary | ICD-10-CM

## 2022-03-24 DIAGNOSIS — Z7951 Long term (current) use of inhaled steroids: Secondary | ICD-10-CM

## 2022-03-24 DIAGNOSIS — E43 Unspecified severe protein-calorie malnutrition: Secondary | ICD-10-CM | POA: Diagnosis present

## 2022-03-24 DIAGNOSIS — Z9981 Dependence on supplemental oxygen: Secondary | ICD-10-CM

## 2022-03-24 DIAGNOSIS — J9611 Chronic respiratory failure with hypoxia: Secondary | ICD-10-CM | POA: Diagnosis present

## 2022-03-24 LAB — CBC WITH DIFFERENTIAL/PLATELET
Abs Immature Granulocytes: 0.06 10*3/uL (ref 0.00–0.07)
Basophils Absolute: 0.1 10*3/uL (ref 0.0–0.1)
Basophils Relative: 0 %
Eosinophils Absolute: 0.1 10*3/uL (ref 0.0–0.5)
Eosinophils Relative: 1 %
HCT: 32.6 % — ABNORMAL LOW (ref 36.0–46.0)
Hemoglobin: 9.9 g/dL — ABNORMAL LOW (ref 12.0–15.0)
Immature Granulocytes: 0 %
Lymphocytes Relative: 15 %
Lymphs Abs: 2.2 10*3/uL (ref 0.7–4.0)
MCH: 27.2 pg (ref 26.0–34.0)
MCHC: 30.4 g/dL (ref 30.0–36.0)
MCV: 89.6 fL (ref 80.0–100.0)
Monocytes Absolute: 0.8 10*3/uL (ref 0.1–1.0)
Monocytes Relative: 5 %
Neutro Abs: 11.4 10*3/uL — ABNORMAL HIGH (ref 1.7–7.7)
Neutrophils Relative %: 79 %
Platelets: 502 10*3/uL — ABNORMAL HIGH (ref 150–400)
RBC: 3.64 MIL/uL — ABNORMAL LOW (ref 3.87–5.11)
RDW: 18.3 % — ABNORMAL HIGH (ref 11.5–15.5)
WBC: 14.6 10*3/uL — ABNORMAL HIGH (ref 4.0–10.5)
nRBC: 0 % (ref 0.0–0.2)

## 2022-03-24 LAB — BASIC METABOLIC PANEL
Anion gap: 10 (ref 5–15)
BUN: 16 mg/dL (ref 8–23)
CO2: 26 mmol/L (ref 22–32)
Calcium: 9.4 mg/dL (ref 8.9–10.3)
Chloride: 109 mmol/L (ref 98–111)
Creatinine, Ser: 0.57 mg/dL (ref 0.44–1.00)
GFR, Estimated: >60 mL/min (ref >60)
Glucose, Bld: 117 mg/dL — ABNORMAL HIGH (ref 70–99)
Potassium: 4 mmol/L (ref 3.5–5.1)
Sodium: 145 mmol/L (ref 135–145)

## 2022-03-24 LAB — SARS CORONAVIRUS 2 BY RT PCR: SARS Coronavirus 2 by RT PCR: NEGATIVE

## 2022-03-24 MED ORDER — METHOCARBAMOL 500 MG PO TABS: 500.0000 mg | ORAL_TABLET | Freq: Two times a day (BID) | ORAL | Status: DC | PRN

## 2022-03-24 MED ORDER — MAGNESIUM OXIDE 400 MG PO CAPS: 400.0000 mg | ORAL_CAPSULE | Freq: Every day | ORAL | Status: DC

## 2022-03-24 MED ORDER — LACTATED RINGERS IV SOLN: INTRAVENOUS | Status: DC

## 2022-03-24 MED ORDER — FAMOTIDINE 20 MG PO TABS
40.0000 mg | ORAL_TABLET | Freq: Every day | ORAL | Status: DC
  Administered 2022-03-24 – 2022-03-28 (×5): 40 mg via ORAL
  Filled 2022-03-24 (×5): qty 2

## 2022-03-24 MED ORDER — PROPYLENE GLYCOL 0.6 % OP SOLN: Freq: Two times a day (BID) | OPHTHALMIC | Status: DC | PRN

## 2022-03-24 MED ORDER — LEVOTHYROXINE SODIUM 25 MCG PO TABS
137.0000 ug | ORAL_TABLET | Freq: Every day | ORAL | Status: DC
Start: 2022-03-25 — End: 2022-03-28
  Administered 2022-03-25 – 2022-03-28 (×4): 137 ug via ORAL
  Filled 2022-03-24 (×4): qty 1

## 2022-03-24 MED ORDER — GUAIFENESIN ER 600 MG PO TB12
600.0000 mg | ORAL_TABLET | Freq: Two times a day (BID) | ORAL | Status: DC
Start: 2022-03-24 — End: 2022-03-28
  Administered 2022-03-24 – 2022-03-28 (×8): 600 mg via ORAL
  Filled 2022-03-24 (×8): qty 1

## 2022-03-24 MED ORDER — HYDROCODONE-ACETAMINOPHEN 5-325 MG PO TABS
1.0000 | ORAL_TABLET | ORAL | Status: DC | PRN
  Administered 2022-03-24 – 2022-03-28 (×7): 1 via ORAL
  Filled 2022-03-24 (×7): qty 1

## 2022-03-24 MED ORDER — ALBUTEROL SULFATE (2.5 MG/3ML) 0.083% IN NEBU: 2.5000 mg | INHALATION_SOLUTION | Freq: Four times a day (QID) | RESPIRATORY_TRACT | Status: DC | PRN

## 2022-03-24 MED ORDER — IPRATROPIUM-ALBUTEROL 0.5-2.5 (3) MG/3ML IN SOLN
3.0000 mL | Freq: Four times a day (QID) | RESPIRATORY_TRACT | Status: DC
  Administered 2022-03-24 (×2): 3 mL via RESPIRATORY_TRACT
  Filled 2022-03-24 (×2): qty 3

## 2022-03-24 MED ORDER — ONDANSETRON HCL 4 MG/2ML IJ SOLN: 4.0000 mg | Freq: Four times a day (QID) | INTRAMUSCULAR | Status: DC | PRN

## 2022-03-24 MED ORDER — POLYVINYL ALCOHOL 1.4 % OP SOLN: 1.0000 [drp] | OPHTHALMIC | Status: DC | PRN

## 2022-03-24 MED ORDER — MAGNESIUM SULFATE 2 GM/50ML IV SOLN
2.0000 g | Freq: Once | INTRAVENOUS | Status: AC
  Administered 2022-03-24: 2 g via INTRAVENOUS
  Filled 2022-03-24: qty 50

## 2022-03-24 MED ORDER — NITROGLYCERIN 0.4 MG SL SUBL: 0.4000 mg | SUBLINGUAL_TABLET | SUBLINGUAL | Status: DC | PRN

## 2022-03-24 MED ORDER — ONDANSETRON HCL 4 MG PO TABS: 4.0000 mg | ORAL_TABLET | Freq: Four times a day (QID) | ORAL | Status: DC | PRN

## 2022-03-24 MED ORDER — ACETAMINOPHEN 650 MG RE SUPP: 650.0000 mg | Freq: Four times a day (QID) | RECTAL | Status: DC | PRN

## 2022-03-24 MED ORDER — QUETIAPINE FUMARATE 100 MG PO TABS
300.0000 mg | ORAL_TABLET | Freq: Every day | ORAL | Status: DC
Start: 2022-03-24 — End: 2022-03-28
  Administered 2022-03-24 – 2022-03-27 (×4): 300 mg via ORAL
  Filled 2022-03-24 (×4): qty 3

## 2022-03-24 MED ORDER — STERILE WATER FOR INJECTION IJ SOLN
INTRAMUSCULAR | Status: AC
  Administered 2022-03-24: 10 mL
  Filled 2022-03-24: qty 10

## 2022-03-24 MED ORDER — ALBUTEROL SULFATE (2.5 MG/3ML) 0.083% IN NEBU
10.0000 mg/h | INHALATION_SOLUTION | Freq: Once | RESPIRATORY_TRACT | Status: AC
  Administered 2022-03-24: 10 mg/h via RESPIRATORY_TRACT
  Filled 2022-03-24: qty 12

## 2022-03-24 MED ORDER — METHYLPREDNISOLONE SODIUM SUCC 40 MG IJ SOLR
40.0000 mg | Freq: Every day | INTRAMUSCULAR | Status: DC
  Administered 2022-03-25: 40 mg via INTRAVENOUS
  Filled 2022-03-24: qty 1

## 2022-03-24 MED ORDER — ROSUVASTATIN CALCIUM 10 MG PO TABS
20.0000 mg | ORAL_TABLET | Freq: Every day | ORAL | Status: DC
  Administered 2022-03-24 – 2022-03-28 (×5): 20 mg via ORAL
  Filled 2022-03-24 (×5): qty 2

## 2022-03-24 MED ORDER — ACETAMINOPHEN 325 MG PO TABS: 650.0000 mg | ORAL_TABLET | Freq: Four times a day (QID) | ORAL | Status: DC | PRN

## 2022-03-24 MED ORDER — LEVOFLOXACIN 500 MG PO TABS
500.0000 mg | ORAL_TABLET | Freq: Every day | ORAL | Status: DC
  Administered 2022-03-24 – 2022-03-26 (×3): 500 mg via ORAL
  Filled 2022-03-24 (×3): qty 1

## 2022-03-24 MED ORDER — PANTOPRAZOLE SODIUM 40 MG PO TBEC
40.0000 mg | DELAYED_RELEASE_TABLET | Freq: Every day | ORAL | Status: DC
  Administered 2022-03-24 – 2022-03-28 (×5): 40 mg via ORAL
  Filled 2022-03-24 (×5): qty 1

## 2022-03-24 MED ORDER — BUSPIRONE HCL 5 MG PO TABS
5.0000 mg | ORAL_TABLET | Freq: Three times a day (TID) | ORAL | Status: DC
  Administered 2022-03-24 – 2022-03-28 (×12): 5 mg via ORAL
  Filled 2022-03-24 (×12): qty 1

## 2022-03-24 MED ORDER — MAGNESIUM OXIDE -MG SUPPLEMENT 400 (240 MG) MG PO TABS
400.0000 mg | ORAL_TABLET | Freq: Every day | ORAL | Status: DC
  Administered 2022-03-24 – 2022-03-27 (×4): 400 mg via ORAL
  Filled 2022-03-24 (×4): qty 1

## 2022-03-24 MED ORDER — METHYLPREDNISOLONE SODIUM SUCC 125 MG IJ SOLR
125.0000 mg | INTRAMUSCULAR | Status: AC
  Administered 2022-03-24: 125 mg via INTRAVENOUS
  Filled 2022-03-24: qty 2

## 2022-03-24 MED ORDER — ENOXAPARIN SODIUM 30 MG/0.3ML IJ SOSY
30.0000 mg | PREFILLED_SYRINGE | INTRAMUSCULAR | Status: DC
Start: 2022-03-24 — End: 2022-03-28
  Administered 2022-03-24 – 2022-03-27 (×4): 30 mg via SUBCUTANEOUS
  Filled 2022-03-24 (×4): qty 0.3

## 2022-03-24 MED ORDER — OXYBUTYNIN CHLORIDE 5 MG PO TABS
5.0000 mg | ORAL_TABLET | Freq: Three times a day (TID) | ORAL | Status: DC
  Administered 2022-03-24 – 2022-03-28 (×12): 5 mg via ORAL
  Filled 2022-03-24 (×12): qty 1

## 2022-03-24 NOTE — Plan of Care (Signed)
  Problem: Clinical Measurements: Goal: Diagnostic test results will improve Outcome: Progressing   Problem: Respiratory: Goal: Ability to maintain adequate ventilation will improve Outcome: Progressing   Problem: Activity: Goal: Will verbalize the importance of balancing activity with adequate rest periods Outcome: Progressing

## 2022-03-24 NOTE — H&P (Signed)
History and Physical    Patient: Tracy Park ZYS:[id number] DOB: 11/28/1955 DOA: 03/24/2022 DOS: the patient was seen and examined on 03/24/2022 PCP: Irving Shows, PA-C  Patient coming from: Home  Chief Complaint:  Chief Complaint  Patient presents with   Shortness of Breath   HPI: Tracy Park is a 66 y.o. female with medical history significant of CAD, anginal pain, history of MI, anxiety, depression, bipolar disorder, COPD, dyspnea, cholelithiasis, hydroureteronephrosis, hypothyroidism, lung cancer, sleep apnea who is returning to the emergency department due to progressively worse dyspnea associated with wheezing and productive cough of whitish sputum despite using her inhalers.  She was given amoxicillin on her previous visit, but just got it a day ago.  She has had some night sweats.  She denied fever, chills, rhinorrhea, sore throat or hemoptysis.  No chest pain, palpitations, diaphoresis, PND, orthopnea or pitting edema of the lower extremities.  No abdominal pain, nausea, emesis, diarrhea, constipation, melena or hematochezia.  No flank pain, dysuria, frequency or hematuria.  No polyuria, polydipsia, polyphagia or blurred vision.   ED course: Initial vital signs were temperature 97.6 F, pulse 95, respiration 19, BP 123/75 mmHg O2 sat 94% on room air.  The patient received magnesium sulfate 2 g IVPB, methylprednisolone 125 mg IVP and a 10 mg continuous albuterol nebulization.  Lab work: CBC showed a white count of 14.6, hemoglobin 9.9 g/dL platelets 502.  Coronavirus PCR was negative.  BMP showed a glucose of 117 mg/dL, but all other values were normal.  Imaging: Portable 1 view chest radiograph showed minimal left basilar atelectasis with chronic postsurgical changes of the left hemithorax.  Review of Systems: As mentioned in the history of present illness. All other systems reviewed and are negative.  Past Medical History:  Diagnosis Date   Anginal pain ([place])     Anxiety    Bipolar disorder ([place])    Cancer ([place])    COPD (chronic obstructive pulmonary disease) ([place])    Dyspnea    Family history of adverse reaction to anesthesia    History of kidney stones    Hydroureteronephrosis 08/16/2021   Hypothyroidism    Lung cancer ([place])    Myocardial infarction ([place])    PTSD (post-traumatic stress disorder)    Sleep apnea    Thyroid disease    Past Surgical History:  Procedure Laterality Date   ABDOMINAL HYSTERECTOMY     BACK SURGERY     CYSTOSCOPY W/ URETERAL STENT PLACEMENT Right 05/03/2021   Procedure: CYSTOSCOPY WITH RETROGRADE PYELOGRAM/URETERAL STENT PLACEMENT;  Surgeon: Ardis Hughs, MD;  Location: [facility];  Service: Urology;  Laterality: Right;   EYE SURGERY     kidney stent     thyroidectomy     Social History:  reports that she has been smoking cigarettes. She has been smoking an average of .15 packs per day. She has never used smokeless tobacco. She reports that she does not currently use alcohol. She reports that she does not currently use drugs.  Allergies  Allergen Reactions   Red Dye Hives and Itching   Oxycontin [Oxycodone Hcl] Other (See Comments)    Hallucinations    Prednisone Hives and Nausea And Vomiting   Aspirin Hives   Tape Rash    Prefers paper tape   Wound Dressing Adhesive Rash    Family History  Family history unknown: Yes    Prior to Admission medications   Medication Sig Start Date End Date Taking? Authorizing Provider  acetaminophen (TYLENOL)  325 MG tablet Take 2 tablets (650 mg total) by mouth every 6 (six) hours as needed for mild pain (or Fever >/= 101). 03/16/22  Yes Nita Sells, MD  amoxicillin-clavulanate (AUGMENTIN XR) 1000-62.5 MG 12 hr tablet Take 1 tablet by mouth 2 (two) times daily for 12 days. Patient taking differently: Take 1 tablet by mouth 2 (two) times daily. 12 day course, pt on day 1 03/16/22 03/28/22 Yes Nita Sells, MD  busPIRone (BUSPAR) 5 MG tablet Take 5 mg by mouth  3 (three) times daily.   Yes [provider]  famotidine (PEPCID) 40 MG tablet Take 40 mg by mouth daily. 02/14/20  Yes [provider]  guaiFENesin (MUCINEX) 600 MG 12 hr tablet Take 1 tablet (600 mg total) by mouth 2 (two) times daily. 03/16/22  Yes Nita Sells, MD  HYDROcodone-acetaminophen (NORCO/VICODIN) 5-325 MG tablet Take 1 tablet by mouth every 4 (four) hours as needed for pain. 03/18/22  Yes [provider]  ipratropium-albuterol (DUONEB) 0.5-2.5 (3) MG/3ML SOLN Take 3 mLs by nebulization every 6 (six) hours as needed (shortness of breath). 02/10/22  Yes [provider]  levothyroxine (SYNTHROID) 137 MCG tablet Take 137 mcg by mouth daily before breakfast. 03/14/20  Yes [provider]  Magnesium Oxide 400 MG CAPS Take 1 capsule (400 mg total) by mouth daily at 12 noon. 03/01/22  Yes Florencia Reasons, MD  meloxicam (MOBIC) 15 MG tablet Take 15 mg by mouth daily as needed for pain. 04/07/20  Yes [provider]  methocarbamol (ROBAXIN) 500 MG tablet Take 500 mg by mouth 2 (two) times daily as needed for muscle spasms. 02/10/20  Yes [provider]  Multiple Vitamins-Minerals (CENTRUM WOMEN) TABS Take 1 tablet by mouth daily.   Yes [provider]  nitroGLYCERIN (NITROSTAT) 0.4 MG SL tablet Place 0.4 mg under the tongue every 5 (five) minutes as needed for chest pain.   Yes [provider]  omeprazole (PRILOSEC) 20 MG capsule Take 20 mg by mouth See admin instructions. 20 mg once daily. May take an additional 20 mg PRN heartburn   Yes [provider]  oxybutynin (DITROPAN) 5 MG tablet Take 5 mg by mouth 3 (three) times daily. 03/15/20  Yes [provider]  Propylene Glycol (SYSTANE COMPLETE OP) Place 1 drop into both eyes 2 (two) times daily as needed (dry eyes).   Yes [provider]  QUEtiapine (SEROQUEL) 300 MG tablet Take 300 mg by mouth at bedtime.   Yes [provider]  rosuvastatin  (CRESTOR) 20 MG tablet Take 20 mg by mouth daily. 01/15/20  Yes [provider]  TRELEGY ELLIPTA 100-62.5-25 MCG/ACT AEPB Take 1 puff by mouth daily. 02/06/22  Yes [provider]  lidocaine (LIDODERM) 5 % Place 1 patch onto the skin daily. Remove & Discard patch within 12 hours or as directed by MD Patient not taking: Reported on 03/13/2022 03/11/22   Lacretia Leigh, MD  OXYGEN Inhale 2 L into the lungs daily as needed (for low oxygen).    [provider]    Physical Exam: Vitals:   03/24/22 0903 03/24/22 1015 03/24/22 1130 03/24/22 1357  BP:  113/66 120/76 (!) 141/86  Pulse:  80 84 87  Resp:  (!) 22 (!) 25 (!) 22  Temp: 97.7 F (36.5 C)   97.6 F (36.4 C)  TempSrc: Oral   Oral  SpO2:  93% 100% 100%  Weight:      Height:       Physical  Exam Vitals and nursing note reviewed.  Constitutional:      General: She is awake.     Appearance: She is underweight. She is ill-appearing.     Comments: Chronically ill-appearing.  HENT:     Head: Normocephalic.     Mouth/Throat:     Mouth: Mucous membranes are moist.  Eyes:     General: No scleral icterus.    Pupils: Pupils are equal, round, and reactive to light.  Neck:     Vascular: No JVD.  Cardiovascular:     Rate and Rhythm: Normal rate and regular rhythm.  Pulmonary:     Breath sounds: Decreased breath sounds, wheezing and rhonchi present. No rales.  Abdominal:     General: Bowel sounds are normal.     Palpations: Abdomen is soft.     Tenderness: There is no abdominal tenderness. There is no right CVA tenderness, left CVA tenderness or rebound.  Musculoskeletal:     Cervical back: Neck supple.     Right lower leg: No edema.     Left lower leg: No edema.  Skin:    General: Skin is warm and dry.  Neurological:     General: No focal deficit present.     Mental Status: She is alert and oriented to person, place, and time.  Psychiatric:        Mood and Affect: Mood normal.        Behavior: Behavior  normal. Behavior is cooperative.     Data Reviewed:  There are no new results to review at this time.  Assessment and Plan: Principal Problem:   COPD with acute exacerbation ([place]) Superimposed on:   Chronic respiratory failure with hypoxia (HCC) Observation/telemetry Continue supplemental oxygen. Methylprednisolone 125 mg IVP x1. Followed by prednisone 40 mg p.o. daily in a.m. Scheduled and as needed bronchodilators. Discontinue amoxicillin/Clavulanate.  Begin levofloxacin 500 mg p.o. daily. Follow-up CBC and chemistry in the morning.   Active Problems:   Nicotine abuse Nicotine replacement therapy offered. Tobacco cessation advised.    Hypothyroidism (acquired) Continue on levothyroxine 137 mcg p.o. daily.    GERD (gastroesophageal reflux disease) Continue omeprazole or formulary equivalent.    Dyslipidemia Continue rosuvastatin 20 mg p.o. daily.    Normocytic anemia Monitor renal function electrolytes.    Advance Care Planning:   Code Status: Full Code   Consults:   Family Communication:   Severity of Illness: The appropriate patient status for this patient is OBSERVATION. Observation status is judged to be reasonable and necessary in order to provide the required intensity of service to ensure the patient's safety. The patient's presenting symptoms, physical exam findings, and initial radiographic and laboratory data in the context of their medical condition is felt to place them at decreased risk for further clinical deterioration. Furthermore, it is anticipated that the patient will be medically stable for discharge from the hospital within 2 midnights of admission.   Author: Reubin Milan, MD 03/24/2022 3:54 PM  For on call review [email or web address].   This document was prepared using Dragon voice recognition software and may contain some unintended transcription errors.

## 2022-03-24 NOTE — ED Provider Notes (Signed)
[facility] DEPT Provider Note   CSN: [id number] Arrival date & time: 03/24/22  0840     History  Chief Complaint  Patient presents with   Shortness of Breath    Pen Mar is a 66 y.o. female.  65 year old female with history of COPD and stage III lung cancer in remission presents with shortness of breath.  Patient continues to use tobacco products.  States that she started having cough and chills this morning.  Has been using her home nebulizer without relief.  Denies any anginal or CHF symptoms.  Called EMS and was given albuterol and Atrovent and does feel slightly better.  She was noted to be wheezing at that time.  She does use home oxygen at 2 L.       Home Medications Prior to Admission medications   Medication Sig Start Date End Date Taking? Authorizing Provider  acetaminophen (TYLENOL) 325 MG tablet Take 650 mg by mouth every 6 (six) hours as needed for moderate pain or headache.    [provider]  acetaminophen (TYLENOL) 325 MG tablet Take 2 tablets (650 mg total) by mouth every 6 (six) hours as needed for mild pain (or Fever >/= 101). 03/16/22   Nita Sells, MD  amoxicillin-clavulanate (AUGMENTIN XR) 1000-62.5 MG 12 hr tablet Take 1 tablet by mouth 2 (two) times daily for 12 days. 03/16/22 03/28/22  Nita Sells, MD  amoxicillin-clavulanate (AUGMENTIN XR) 1000-62.5 MG 12 hr tablet Take 2 tablets by mouth 2 (two) times daily for 12 days. 03/20/22 04/03/22  Rancour, Annie Main, MD  busPIRone (BUSPAR) 5 MG tablet Take 5 mg by mouth 3 (three) times daily.    [provider]  famotidine (PEPCID) 40 MG tablet Take 40 mg by mouth daily. 02/14/20   [provider]  guaiFENesin (MUCINEX) 600 MG 12 hr tablet Take 1 tablet (600 mg total) by mouth 2 (two) times daily. 03/16/22   Nita Sells, MD  ipratropium-albuterol (DUONEB) 0.5-2.5 (3) MG/3ML SOLN Take 3 mLs by nebulization every 6 (six) hours as needed  (shortness of breath). 02/10/22   [provider]  levothyroxine (SYNTHROID) 137 MCG tablet Take 137 mcg by mouth daily before breakfast. 03/14/20   [provider]  lidocaine (LIDODERM) 5 % Place 1 patch onto the skin daily. Remove & Discard patch within 12 hours or as directed by MD Patient not taking: Reported on 03/13/2022 03/11/22   Lacretia Leigh, MD  Magnesium Oxide 400 MG CAPS Take 1 capsule (400 mg total) by mouth daily at 12 noon. Patient taking differently: Take 400 mg by mouth daily. 03/01/22   Florencia Reasons, MD  meloxicam (MOBIC) 15 MG tablet Take 15 mg by mouth daily. 04/07/20   [provider]  methocarbamol (ROBAXIN) 500 MG tablet Take 500 mg by mouth 2 (two) times daily. 02/10/20   [provider]  Multiple Vitamins-Minerals (CENTRUM WOMEN) TABS Take 1 tablet by mouth daily.    [provider]  nitroGLYCERIN (NITROSTAT) 0.4 MG SL tablet Place 0.4 mg under the tongue every 5 (five) minutes as needed for chest pain.    [provider]  omeprazole (PRILOSEC) 20 MG capsule Take 20 mg by mouth daily. Takes an additional capsule if needed for acid reflux    [provider]  oxybutynin (DITROPAN) 5 MG tablet Take 5 mg by mouth 3 (three) times daily. 03/15/20   [provider]  OXYGEN Inhale 2 L into the lungs daily as needed (for low oxygen).  [provider]  Propylene Glycol (SYSTANE COMPLETE OP) Place 1 drop into both eyes daily as needed (dry eyes).    [provider]  QUEtiapine (SEROQUEL) 300 MG tablet Take 300 mg by mouth at bedtime.    [provider]  rosuvastatin (CRESTOR) 20 MG tablet Take 20 mg by mouth daily. 01/15/20   [provider]  TRELEGY ELLIPTA 100-62.5-25 MCG/ACT AEPB Take 1 puff by mouth daily. 02/06/22   [provider]      Allergies    Red dye, Oxycontin [oxycodone hcl], Prednisone, Aspirin, and Tape    Review of Systems   Review of Systems  All other systems  reviewed and are negative.   Physical Exam Updated Vital Signs BP 123/75 (BP Location: Right Arm)   Pulse 95   Resp 19   Ht 1.6 m (5\' 3" )   Wt 45.4 kg   SpO2 94%   BMI 17.71 kg/m  Physical Exam Vitals and nursing note reviewed.  Constitutional:      General: She is not in acute distress.    Appearance: Normal appearance. She is well-developed. She is not toxic-appearing.  HENT:     Head: Normocephalic and atraumatic.  Eyes:     General: Lids are normal.     Conjunctiva/sclera: Conjunctivae normal.     Pupils: Pupils are equal, round, and reactive to light.  Neck:     Thyroid: No thyroid mass.     Trachea: No tracheal deviation.  Cardiovascular:     Rate and Rhythm: Normal rate and regular rhythm.     Heart sounds: Normal heart sounds. No murmur heard.    No gallop.  Pulmonary:     Effort: Tachypnea and respiratory distress present.     Breath sounds: No stridor. Examination of the right-upper field reveals decreased breath sounds and wheezing. Examination of the left-upper field reveals decreased breath sounds and wheezing. Decreased breath sounds and wheezing present. No rhonchi or rales.  Abdominal:     General: There is no distension.     Palpations: Abdomen is soft.     Tenderness: There is no abdominal tenderness. There is no rebound.  Musculoskeletal:        General: No tenderness. Normal range of motion.     Cervical back: Normal range of motion and neck supple.  Skin:    General: Skin is warm and dry.     Findings: No abrasion or rash.  Neurological:     Mental Status: She is alert and oriented to person, place, and time. Mental status is at baseline.     GCS: GCS eye subscore is 4. GCS verbal subscore is 5. GCS motor subscore is 6.     Cranial Nerves: No cranial nerve deficit.     Sensory: No sensory deficit.     Motor: Motor function is intact.  Psychiatric:        Attention and Perception: Attention normal.        Speech: Speech normal.        Behavior:  Behavior normal.     ED Results / Procedures / Treatments   Labs (all labs ordered are listed, but only abnormal results are displayed) Labs Reviewed - No data to display  EKG None  Radiology No results found.  Procedures Procedures    Medications Ordered in ED Medications - No data to display  ED Course/ Medical Decision Making/ A&P  Medical Decision Making Amount and/or Complexity of Data Reviewed Labs: ordered. Radiology: ordered.  Risk Prescription drug management.   Patient presents with likely COPD exacerbation.  Chest x-ray per interpretation showed no acute infiltrates.  Patient treated with albuterol, magnesium, Solu-Medrol.  Reassessed and continues to be dyspneic.  Other differentials include ACS versus PE.  Wheezing has improved and therefore feel less likely.  Patient's COVID test is negative.  Patient will require admission  CRITICAL CARE Performed by: Leota Jacobsen Total critical care time: 50 minutes Critical care time was exclusive of separately billable procedures and treating other patients. Critical care was necessary to treat or prevent imminent or life-threatening deterioration. Critical care was time spent personally by me on the following activities: development of treatment plan with patient and/or surrogate as well as nursing, discussions with consultants, evaluation of patient's response to treatment, examination of patient, obtaining history from patient or surrogate, ordering and performing treatments and interventions, ordering and review of laboratory studies, ordering and review of radiographic studies, pulse oximetry and re-evaluation of patient's condition.         Final Clinical Impression(s) / ED Diagnoses Final diagnoses:  None    Rx / DC Orders ED Discharge Orders     None         Lacretia Leigh, MD 03/24/22 1142

## 2022-03-24 NOTE — ED Triage Notes (Signed)
PT to ER via EMS with c/o increasing SHOB.  Pt was seen by PCP recently and was told she may have PNA.  EMS administered 0.5 Atrovent and 5 Albuterol, pt endorses some improvement.

## 2022-03-24 NOTE — Progress Notes (Signed)
Patient arrived to 5E16 in NAD, VS stable and patient free from pain. Patient oriented to room and call bell in reach.

## 2022-03-25 DIAGNOSIS — J9621 Acute and chronic respiratory failure with hypoxia: Secondary | ICD-10-CM | POA: Diagnosis present

## 2022-03-25 DIAGNOSIS — J9611 Chronic respiratory failure with hypoxia: Secondary | ICD-10-CM

## 2022-03-25 DIAGNOSIS — Z79890 Hormone replacement therapy (postmenopausal): Secondary | ICD-10-CM | POA: Diagnosis not present

## 2022-03-25 DIAGNOSIS — E43 Unspecified severe protein-calorie malnutrition: Secondary | ICD-10-CM | POA: Diagnosis present

## 2022-03-25 DIAGNOSIS — Z9981 Dependence on supplemental oxygen: Secondary | ICD-10-CM | POA: Diagnosis not present

## 2022-03-25 DIAGNOSIS — Z20822 Contact with and (suspected) exposure to covid-19: Secondary | ICD-10-CM | POA: Diagnosis present

## 2022-03-25 DIAGNOSIS — Z9221 Personal history of antineoplastic chemotherapy: Secondary | ICD-10-CM | POA: Diagnosis not present

## 2022-03-25 DIAGNOSIS — D638 Anemia in other chronic diseases classified elsewhere: Secondary | ICD-10-CM | POA: Diagnosis present

## 2022-03-25 DIAGNOSIS — K219 Gastro-esophageal reflux disease without esophagitis: Secondary | ICD-10-CM | POA: Diagnosis present

## 2022-03-25 DIAGNOSIS — F319 Bipolar disorder, unspecified: Secondary | ICD-10-CM | POA: Diagnosis present

## 2022-03-25 DIAGNOSIS — Z79899 Other long term (current) drug therapy: Secondary | ICD-10-CM | POA: Diagnosis not present

## 2022-03-25 DIAGNOSIS — J441 Chronic obstructive pulmonary disease with (acute) exacerbation: Secondary | ICD-10-CM | POA: Diagnosis present

## 2022-03-25 DIAGNOSIS — E785 Hyperlipidemia, unspecified: Secondary | ICD-10-CM | POA: Diagnosis present

## 2022-03-25 DIAGNOSIS — R0602 Shortness of breath: Secondary | ICD-10-CM | POA: Diagnosis present

## 2022-03-25 DIAGNOSIS — E039 Hypothyroidism, unspecified: Secondary | ICD-10-CM

## 2022-03-25 DIAGNOSIS — D75839 Thrombocytosis, unspecified: Secondary | ICD-10-CM | POA: Diagnosis present

## 2022-03-25 DIAGNOSIS — F4310 Post-traumatic stress disorder, unspecified: Secondary | ICD-10-CM | POA: Diagnosis present

## 2022-03-25 DIAGNOSIS — Z85118 Personal history of other malignant neoplasm of bronchus and lung: Secondary | ICD-10-CM | POA: Diagnosis not present

## 2022-03-25 DIAGNOSIS — Z902 Acquired absence of lung [part of]: Secondary | ICD-10-CM | POA: Diagnosis not present

## 2022-03-25 DIAGNOSIS — Z7951 Long term (current) use of inhaled steroids: Secondary | ICD-10-CM | POA: Diagnosis not present

## 2022-03-25 DIAGNOSIS — I252 Old myocardial infarction: Secondary | ICD-10-CM | POA: Diagnosis not present

## 2022-03-25 DIAGNOSIS — Z90710 Acquired absence of both cervix and uterus: Secondary | ICD-10-CM | POA: Diagnosis not present

## 2022-03-25 DIAGNOSIS — F17210 Nicotine dependence, cigarettes, uncomplicated: Secondary | ICD-10-CM | POA: Diagnosis present

## 2022-03-25 DIAGNOSIS — B370 Candidal stomatitis: Secondary | ICD-10-CM | POA: Diagnosis present

## 2022-03-25 DIAGNOSIS — I2510 Atherosclerotic heart disease of native coronary artery without angina pectoris: Secondary | ICD-10-CM | POA: Diagnosis present

## 2022-03-25 MED ORDER — METHYLPREDNISOLONE SODIUM SUCC 40 MG IJ SOLR
40.0000 mg | Freq: Two times a day (BID) | INTRAMUSCULAR | Status: DC
  Administered 2022-03-25 – 2022-03-26 (×2): 40 mg via INTRAVENOUS
  Filled 2022-03-25 (×2): qty 1

## 2022-03-25 MED ORDER — IPRATROPIUM-ALBUTEROL 0.5-2.5 (3) MG/3ML IN SOLN
3.0000 mL | Freq: Four times a day (QID) | RESPIRATORY_TRACT | Status: DC
Start: 2022-03-25 — End: 2022-03-26
  Administered 2022-03-25 – 2022-03-26 (×3): 3 mL via RESPIRATORY_TRACT
  Filled 2022-03-25 (×4): qty 3

## 2022-03-25 MED ORDER — ALBUTEROL SULFATE (2.5 MG/3ML) 0.083% IN NEBU: 2.5000 mg | INHALATION_SOLUTION | RESPIRATORY_TRACT | Status: DC | PRN

## 2022-03-25 MED ORDER — IPRATROPIUM-ALBUTEROL 0.5-2.5 (3) MG/3ML IN SOLN
3.0000 mL | Freq: Two times a day (BID) | RESPIRATORY_TRACT | Status: DC
  Administered 2022-03-25: 3 mL via RESPIRATORY_TRACT
  Filled 2022-03-25: qty 3

## 2022-03-25 MED ORDER — BUDESONIDE 0.5 MG/2ML IN SUSP
0.5000 mg | Freq: Two times a day (BID) | RESPIRATORY_TRACT | Status: DC
  Administered 2022-03-25 – 2022-03-28 (×6): 0.5 mg via RESPIRATORY_TRACT
  Filled 2022-03-25 (×7): qty 2

## 2022-03-25 NOTE — TOC Initial Note (Signed)
Transition of Care [facility]) - Initial/Assessment Note    Patient Details  Name: Tracy Park MRN: [id number] Date of Birth: [birth year]  Transition of Care [facility]) CM/SW Contact:    Vassie Moselle, LCSW Phone Number: 03/25/2022, 11:24 AM  Clinical Narrative:                 Met with pt who shares she lives independently in a mobile home. We discussed eating a heart healthy diet in which she reports she eats a diabetic diet as this is what she is used to from her spouse having diabetes. Pt states she uses 2L of home O2 and has 8 travel tanks at home and does not want another one ordered. She states she does not have good relationships with her daughters and does not have anyone who can provide transportation at discharge. Pt will need assistance with transportation at discharge. TBD on what level of transportation will be needed, taxi vs ambulance.   Expected Discharge Plan: Home/Self Care Barriers to Discharge: Transportation   Patient Goals and CMS Choice Patient states their goals for this hospitalization and ongoing recovery are:: To return home   Choice offered to / list presented to : Patient  Expected Discharge Plan and Services Expected Discharge Plan: Home/Self Care In-house Referral: Clinical Social Work Discharge Planning Services: CM Consult Post Acute Care Choice: NA Living arrangements for the past 2 months: Single Family Home                 DME Arranged: N/A DME Agency: NA                  Prior Living Arrangements/Services Living arrangements for the past 2 months: Single Family Home Lives with:: Self Patient language and need for interpreter reviewed:: Yes Do you feel safe going back to the place where you live?: Yes      Need for Family Participation in Patient Care: No (Comment) Care giver support system in place?: No (comment) Current home services: DME Criminal Activity/Legal Involvement Pertinent to Current Situation/Hospitalization: No -  Comment as needed  Activities of Daily Living Home Assistive Devices/Equipment: Environmental consultant (specify type), Cane (specify quad or straight) ADL Screening (condition at time of admission) Patient's cognitive ability adequate to safely complete daily activities?: Yes Is the patient deaf or have difficulty hearing?: No Does the patient have difficulty seeing, even when wearing glasses/contacts?: No Does the patient have difficulty concentrating, remembering, or making decisions?: No Patient able to express need for assistance with ADLs?: Yes Does the patient have difficulty dressing or bathing?: No Independently performs ADLs?: Yes (appropriate for developmental age) Does the patient have difficulty walking or climbing stairs?: No Weakness of Legs: Both Weakness of Arms/Hands: None  Permission Sought/Granted   Permission granted to share information with : No              Emotional Assessment Appearance:: Appears stated age Attitude/Demeanor/Rapport: Ambitious, Engaged, Charismatic Affect (typically observed): Accepting, Hopeful, Pleasant Orientation: : Oriented to Self, Oriented to Place, Oriented to  Time, Oriented to Situation Alcohol / Substance Use: Not Applicable Psych Involvement: No (comment)  Admission diagnosis:  COPD with acute exacerbation ([place]) [J44.1] Patient Active Problem List   Diagnosis Date Noted   DDD (degenerative disc disease), cervical 03/24/2022   Bipolar 2 disorder ([place]) 03/24/2022   Septic shock ([place]) 03/14/2022   Sepsis due to pneumonia ([place]) 03/13/2022   Dyslipidemia 03/13/2022   Anxiety and depression 03/13/2022   History of lung cancer  03/13/2022   Tobacco abuse 03/13/2022   Pneumonia 02/27/2022   Adenocarcinoma of lung ([place]) 02/26/2022   Leukocytosis 02/26/2022   COPD with acute exacerbation ([place]) 02/26/2022   Chronic respiratory failure with hypoxia ([place]) 02/26/2022   Normocytic anemia 02/08/2022   Protein-calorie malnutrition, severe  08/25/2021   Influenza A with pneumonia 08/16/2021   Sepsis ([place]) 08/16/2021   Hyponatremia 08/16/2021   Hypokalemia 08/16/2021   Hypomagnesemia 08/16/2021   Hydronephrosis 05/03/2021   Acute encephalopathy 05/02/2021   CAP (community acquired pneumonia) 04/10/2020   Coronary artery disease involving native coronary artery of native heart without angina pectoris 08/18/2018   Chronic bilateral low back pain without sciatica 08/21/2016   Hyperlipidemia with target LDL less than 100 06/03/2013   GERD (gastroesophageal reflux disease) 04/28/2012   Hypothyroidism (acquired) 04/28/2012   Nicotine abuse 04/28/2012   Depression 04/28/2012   Chest pain, atypical 04/28/2012   PCP:  Irving Shows, PA-C Pharmacy:   [id number] [place] [address]Phone: [phone]: [address] [id number]: [phone]: [phone]    Social Determinants of Health (SDOH) Interventions    Readmission Risk Interventions    03/01/2022    9:00 AM  Readmission Risk Prevention Plan  Transportation Screening Complete  PCP or Specialist Appt within 5-7 Days Complete  Home Care Screening Complete  Medication Review (RN CM) Complete

## 2022-03-25 NOTE — Progress Notes (Signed)
PROGRESS NOTE    Tracy Park  NWG:[id number] DOB: [birth year] DOA: 03/24/2022 PCP: Irving Shows, PA-C   Brief Narrative:  66 y.o. female with medical history significant of left upper lobe adenocarcinoma s/p left upper lobectomy in 2019 and chemotherapy, COPD with chronic hypoxic respiratory failure on 2 L, OSA, hypothyroidism, hyperlipidemia, GERD, ongoing tobacco, anxiety/depression/bipolar disorder, recent multiple hospitalizations for COPD exacerbation/pneumonia with last hospitalization from 03/13/2022-03/16/2022 for septic shock secondary to pneumonia presented with worsening shortness of breath.  On presentation, WBCs was 14.6, COVID testing was negative; chest x-ray showed minimal left basilar atelectasis with chronic postsurgical changes of the left hemithorax.  She was started on IV Solu-Medrol.  Assessment & Plan:   COPD exacerbation Chronic respiratory failure with hypoxia Tobacco abuse, ongoing -Has had multiple recent hospitalizations for COPD exacerbation and pneumonia. -Still smoking.  Counseled regarding cessation -Still wheezing.  Increase Solu-Medrol to 40 mg IV every 12 hours.  Continue nebs.  Add budesonide -Outpatient follow-up with pulmonary -Palliative care consultation for goals of care discussion.  Patient used to be a DNR but now she is a full code.  Adenocarcinoma of lung -Status post left upper lobectomy in 2019 and chemotherapy; currently in remission.  Follows up with oncology at [facility]  Severe protein-calorie malnutrition -Nutrition consult   Hypothyroidism -Continue levothyroxine   GERD -Continue PPI and famotidine    hyperlipidemia -continue statin  Anxiety/depression/bipolar disorder -Continue buspirone, quetiapine   Physical deconditioning -PT eval  DVT prophylaxis: Lovenox Code Status: Full Family Communication: None at bedside Disposition Plan: Status is: Observation The patient will require care spanning > 2 midnights  and should be moved to inpatient because: Of severity of illness.  Need for IV steroids.  Consultants: None  Procedures: None  Antimicrobials:  Anti-infectives (From admission, onward)    Start     Dose/Rate Route Frequency Ordered Stop   03/24/22 1630  levofloxacin (LEVAQUIN) tablet 500 mg        500 mg Oral Daily 03/24/22 1534          Subjective: Patient seen and examined at bedside.  No overnight fever, vomiting, chest pain reported.  Still short of breath with exertion and does not feel ready to go home yet.  Objective: Vitals:   03/24/22 1357 03/24/22 2037 03/24/22 2126 03/25/22 0154  BP: (!) 141/86  105/67 97/66  Pulse: 87  92 71  Resp: (!) 22 20 (!) 21 19  Temp: 97.6 F (36.4 C)  (!) 97.3 F (36.3 C) (!) 97.5 F (36.4 C)  TempSrc: Oral     SpO2: 100%  100% 99%  Weight: 47.3 kg     Height: 5\' 3"  (1.6 m)       Intake/Output Summary (Last 24 hours) at 03/25/2022 1135 Last data filed at 03/25/2022 0845 Gross per 24 hour  Intake 1363.92 ml  Output 1 ml  Net 1362.92 ml   Filed Weights   03/24/22 0900 03/24/22 1357  Weight: 45.4 kg 47.3 kg    Examination:  General exam: Appears calm and comfortable.  Looks chronically ill and deconditioned and very thinly built.  Currently on 2 L oxygen via nasal cannula. Respiratory system: Bilateral decreased breath sounds at bases with scattered crackles/wheezing and intermittent tachypnea Cardiovascular system: S1 & S2 heard, Rate controlled Gastrointestinal system: Abdomen is nondistended, soft and nontender. Normal bowel sounds heard. Extremities: No cyanosis, clubbing, edema  Central nervous system: Alert and oriented. No focal neurological deficits. Moving extremities Skin: No rashes, lesions or ulcers  Psychiatry: Judgement and insight appear normal. Mood & affect appropriate.     Data Reviewed: I have personally reviewed following labs and imaging studies  CBC: Recent Labs  Lab 03/19/22 1943 03/20/22 0827  03/24/22 0905  WBC 10.9* 10.7* 14.6*  NEUTROABS 7.3 7.8* 11.4*  HGB 9.7* 10.6* 9.9*  HCT 31.9* 34.4* 32.6*  MCV 89.9 90.8 89.6  PLT 403* 424* 161*   Basic Metabolic Panel: Recent Labs  Lab 03/19/22 1943 03/20/22 0827 03/24/22 0905  NA 141 141 145  K 4.0 4.6 4.0  CL 102 101 109  CO2 33* 29 26  GLUCOSE 97 114* 117*  BUN 13 9 16   CREATININE 0.47 0.42* 0.57  CALCIUM 8.9 9.0 9.4   GFR: Estimated Creatinine Clearance: 53.7 mL/min (by C-G formula based on SCr of 0.57 mg/dL). Liver Function Tests: Recent Labs  Lab 03/20/22 0827  AST 21  ALT 15  ALKPHOS 94  BILITOT 0.6  PROT 6.8  ALBUMIN 2.9*   Recent Labs  Lab 03/20/22 0827  LIPASE 20   No results for input(s): "AMMONIA" in the last 168 hours. Coagulation Profile: No results for input(s): "INR", "PROTIME" in the last 168 hours. Cardiac Enzymes: No results for input(s): "CKTOTAL", "CKMB", "CKMBINDEX", "TROPONINI" in the last 168 hours. BNP (last 3 results) No results for input(s): "PROBNP" in the last 8760 hours. HbA1C: No results for input(s): "HGBA1C" in the last 72 hours. CBG: No results for input(s): "GLUCAP" in the last 168 hours. Lipid Profile: No results for input(s): "CHOL", "HDL", "LDLCALC", "TRIG", "CHOLHDL", "LDLDIRECT" in the last 72 hours. Thyroid Function Tests: No results for input(s): "TSH", "T4TOTAL", "FREET4", "T3FREE", "THYROIDAB" in the last 72 hours. Anemia Panel: No results for input(s): "VITAMINB12", "FOLATE", "FERRITIN", "TIBC", "IRON", "RETICCTPCT" in the last 72 hours. Sepsis Labs: No results for input(s): "PROCALCITON", "LATICACIDVEN" in the last 168 hours.  Recent Results (from the past 240 hour(s))  SARS Coronavirus 2 by RT PCR (hospital order, performed in [facility] hospital lab) *cepheid single result test* Anterior Nasal Swab     Status: None   Collection Time: 03/24/22  9:17 AM   Specimen: Anterior Nasal Swab  Result Value Ref Range Status   SARS Coronavirus 2 by RT PCR  NEGATIVE NEGATIVE Final    Comment: (NOTE) SARS-CoV-2 target nucleic acids are NOT DETECTED.  The SARS-CoV-2 RNA is generally detectable in upper and lower respiratory specimens during the acute phase of infection. The lowest concentration of SARS-CoV-2 viral copies this assay can detect is 250 copies / mL. A negative result does not preclude SARS-CoV-2 infection and should not be used as the sole basis for treatment or other patient management decisions.  A negative result may occur with improper specimen collection / handling, submission of specimen other than nasopharyngeal swab, presence of viral mutation(s) within the areas targeted by this assay, and inadequate number of viral copies (<250 copies / mL). A negative result must be combined with clinical observations, patient history, and epidemiological information.  Fact Sheet for Patients:   https://www.patel.info/  Fact Sheet for Healthcare Providers: https://hall.com/  This test is not yet approved or  cleared by the [place] FDA and has been authorized for detection and/or diagnosis of SARS-CoV-2 by FDA under an Emergency Use Authorization (EUA).  This EUA will remain in effect (meaning this test can be used) for the duration of the COVID-19 declaration under Section 564(b)(1) of the Act, 21 U.S.C. section 360bbb-3(b)(1), unless the authorization is terminated or revoked sooner.  Performed at  [facility], [place] [address], [place], [place] [address]          Radiology Studies: [facility] Chest Port 1 View  Result Date: 03/24/2022 CLINICAL DATA:  Cough.  History of lung cancer EXAM: PORTABLE CHEST 1 VIEW COMPARISON:  03/21/2022 FINDINGS: Chronic volume loss in the left hemithorax related to left upper lobectomy. Chronic scarring and pleural thickening at the left lung apex. Minimal left basilar atelectasis. Right lung is clear. No pleural effusion or  pneumothorax. Heart size is within normal limits. IMPRESSION: Chronic postsurgical changes of the left hemithorax. Minimal left basilar atelectasis. Otherwise no acute process in the chest. Electronically Signed   By: Davina Poke D.O.   On: 03/24/2022 10:00        Scheduled Meds:  busPIRone  5 mg Oral TID   enoxaparin (LOVENOX) injection  30 mg Subcutaneous Q24H   famotidine  40 mg Oral Daily   guaiFENesin  600 mg Oral BID   ipratropium-albuterol  3 mL Nebulization BID   levofloxacin  500 mg Oral Daily   levothyroxine  137 mcg Oral QAC breakfast   magnesium oxide  400 mg Oral Daily   methylPREDNISolone (SOLU-MEDROL) injection  40 mg Intravenous Daily   oxybutynin  5 mg Oral TID   pantoprazole  40 mg Oral Daily   QUEtiapine  300 mg Oral QHS   rosuvastatin  20 mg Oral Daily   Continuous Infusions:  lactated ringers 50 mL/hr at 03/25/22 0737          Aline August, MD [facility] 03/25/2022, 11:35 AM

## 2022-03-25 NOTE — Plan of Care (Signed)
  Problem: Respiratory: Goal: Ability to maintain adequate ventilation will improve Outcome: Progressing   Problem: Education: Goal: Knowledge of disease or condition will improve Outcome: Progressing

## 2022-03-26 DIAGNOSIS — F419 Anxiety disorder, unspecified: Secondary | ICD-10-CM

## 2022-03-26 DIAGNOSIS — K219 Gastro-esophageal reflux disease without esophagitis: Secondary | ICD-10-CM | POA: Diagnosis not present

## 2022-03-26 DIAGNOSIS — E785 Hyperlipidemia, unspecified: Secondary | ICD-10-CM | POA: Diagnosis not present

## 2022-03-26 DIAGNOSIS — D649 Anemia, unspecified: Secondary | ICD-10-CM

## 2022-03-26 DIAGNOSIS — F339 Major depressive disorder, recurrent, unspecified: Secondary | ICD-10-CM

## 2022-03-26 DIAGNOSIS — J9611 Chronic respiratory failure with hypoxia: Secondary | ICD-10-CM | POA: Diagnosis not present

## 2022-03-26 DIAGNOSIS — D75839 Thrombocytosis, unspecified: Secondary | ICD-10-CM

## 2022-03-26 DIAGNOSIS — E039 Hypothyroidism, unspecified: Secondary | ICD-10-CM | POA: Diagnosis not present

## 2022-03-26 DIAGNOSIS — J441 Chronic obstructive pulmonary disease with (acute) exacerbation: Secondary | ICD-10-CM | POA: Diagnosis not present

## 2022-03-26 DIAGNOSIS — Z515 Encounter for palliative care: Secondary | ICD-10-CM

## 2022-03-26 DIAGNOSIS — Z720 Tobacco use: Secondary | ICD-10-CM

## 2022-03-26 MED ORDER — IPRATROPIUM-ALBUTEROL 0.5-2.5 (3) MG/3ML IN SOLN
3.0000 mL | Freq: Three times a day (TID) | RESPIRATORY_TRACT | Status: DC
  Administered 2022-03-26 – 2022-03-28 (×6): 3 mL via RESPIRATORY_TRACT
  Filled 2022-03-26 (×6): qty 3

## 2022-03-26 MED ORDER — METHYLPREDNISOLONE SODIUM SUCC 125 MG IJ SOLR
80.0000 mg | Freq: Two times a day (BID) | INTRAMUSCULAR | Status: DC
  Administered 2022-03-26 – 2022-03-28 (×4): 80 mg via INTRAVENOUS
  Filled 2022-03-26 (×4): qty 2

## 2022-03-26 MED ORDER — POLYETHYLENE GLYCOL 3350 17 G PO PACK
17.0000 g | PACK | Freq: Once | ORAL | Status: AC
  Administered 2022-03-26: 17 g via ORAL
  Filled 2022-03-26: qty 1

## 2022-03-26 NOTE — Progress Notes (Signed)
PROGRESS NOTE    Tracy Park  ZOX:[id number] DOB: 12-Jun-1956 DOA: 03/24/2022 PCP: Irving Shows, PA-C   Brief Narrative:  66 y.o. female with medical history significant of left upper lobe adenocarcinoma s/p left upper lobectomy in 2019 and chemotherapy, COPD with chronic hypoxic respiratory failure on 2 L, OSA, hypothyroidism, hyperlipidemia, GERD, ongoing tobacco, anxiety/depression/bipolar disorder, recent multiple hospitalizations for COPD exacerbation/pneumonia with last hospitalization from 03/13/2022-03/16/2022 for septic shock secondary to pneumonia presented with worsening shortness of breath.  On presentation, WBCs was 14.6, COVID testing was negative; chest x-ray showed minimal left basilar atelectasis with chronic postsurgical changes of the left hemithorax.  She was started on IV Solu-Medrol.  Assessment & Plan:   COPD exacerbation Chronic respiratory failure with hypoxia Tobacco abuse, ongoing -Has had multiple recent hospitalizations for COPD exacerbation and pneumonia. -Still smoking.  Counseled regarding cessation -Still wheezing and does not feel ready to go home yet. Increase Solu-Medrol to 80 mg IV every 12 hours.  Continue nebs -Outpatient follow-up with pulmonary -Palliative care consultation for goals of care discussion.  Patient used to be a DNR but now she is a full code.  Adenocarcinoma of lung -Status post left upper lobectomy in 2019 and chemotherapy; currently in remission.  Follows up with oncology at [facility]  Leukocytosis -Possibly reactive.  Monitor intermittently  Thrombocytosis -Possibly reactive.  Monitor intermittently  Anemia of chronic disease -Possibly from chronic illnesses.  Monitor intermittently.  Severe protein-calorie malnutrition -Nutrition consult   Hypothyroidism -Continue levothyroxine   GERD -Continue PPI and famotidine    hyperlipidemia -continue statin  Anxiety/depression/bipolar disorder -Continue buspirone,  quetiapine   Physical deconditioning -PT eval  DVT prophylaxis: Lovenox Code Status: Full Family Communication: None at bedside Disposition Plan: Status is: inpatient because: Of severity of illness.  Need for IV steroids.  Consultants: None  Procedures: None  Antimicrobials:  Anti-infectives (From admission, onward)    Start     Dose/Rate Route Frequency Ordered Stop   03/24/22 1630  levofloxacin (LEVAQUIN) tablet 500 mg        500 mg Oral Daily 03/24/22 1534          Subjective: Patient seen and examined at bedside.  Feels slightly better but still wheezing and short of breath with exertion and does not feel ready to go home today.  No fever, vomiting, chest pain reported. Objective: Vitals:   03/25/22 1409 03/25/22 1417 03/25/22 2041 03/26/22 0511  BP: 118/88  121/79 129/81  Pulse: 87  79 75  Resp: 18  (!) 21 (!) 21  Temp: (!) 97.3 F (36.3 C)  (!) 97.5 F (36.4 C) (!) 97.4 F (36.3 C)  TempSrc: Oral     SpO2: 100% 96% 100% 98%  Weight:      Height:        Intake/Output Summary (Last 24 hours) at 03/26/2022 0943 Last data filed at 03/26/2022 0900 Gross per 24 hour  Intake 1260.08 ml  Output 2 ml  Net 1258.08 ml    Filed Weights   03/24/22 0900 03/24/22 1357  Weight: 45.4 kg 47.3 kg    Examination:  General: On 2 L oxygen by nasal cannula.  No distress.  Looks extremely ill and deconditioned and very thinly built. ENT/neck: No thyromegaly.  JVD is not elevated  respiratory: Decreased breath sounds at bases bilaterally with some crackles and diffuse wheezing  CVS: S1-S2 heard, rate controlled currently Abdominal: Soft, nontender, slightly distended; no organomegaly, bowel sounds are heard Extremities: Trace lower extremity edema;  no cyanosis  CNS: Awake and alert.  No focal neurologic deficit.  Moves extremities Lymph: No obvious lymphadenopathy Skin: No obvious ecchymosis/lesions  psych: Not agitated currently.  Mostly flat affect.    Musculoskeletal: No obvious joint swelling/deformity     Data Reviewed: I have personally reviewed following labs and imaging studies  CBC: Recent Labs  Lab 03/19/22 1943 03/20/22 0827 03/24/22 0905  WBC 10.9* 10.7* 14.6*  NEUTROABS 7.3 7.8* 11.4*  HGB 9.7* 10.6* 9.9*  HCT 31.9* 34.4* 32.6*  MCV 89.9 90.8 89.6  PLT 403* 424* 502*    Basic Metabolic Panel: Recent Labs  Lab 03/19/22 1943 03/20/22 0827 03/24/22 0905  NA 141 141 145  K 4.0 4.6 4.0  CL 102 101 109  CO2 33* 29 26  GLUCOSE 97 114* 117*  BUN 13 9 16   CREATININE 0.47 0.42* 0.57  CALCIUM 8.9 9.0 9.4    GFR: Estimated Creatinine Clearance: 53.7 mL/min (by C-G formula based on SCr of 0.57 mg/dL). Liver Function Tests: Recent Labs  Lab 03/20/22 0827  AST 21  ALT 15  ALKPHOS 94  BILITOT 0.6  PROT 6.8  ALBUMIN 2.9*    Recent Labs  Lab 03/20/22 0827  LIPASE 20    No results for input(s): "AMMONIA" in the last 168 hours. Coagulation Profile: No results for input(s): "INR", "PROTIME" in the last 168 hours. Cardiac Enzymes: No results for input(s): "CKTOTAL", "CKMB", "CKMBINDEX", "TROPONINI" in the last 168 hours. BNP (last 3 results) No results for input(s): "PROBNP" in the last 8760 hours. HbA1C: No results for input(s): "HGBA1C" in the last 72 hours. CBG: No results for input(s): "GLUCAP" in the last 168 hours. Lipid Profile: No results for input(s): "CHOL", "HDL", "LDLCALC", "TRIG", "CHOLHDL", "LDLDIRECT" in the last 72 hours. Thyroid Function Tests: No results for input(s): "TSH", "T4TOTAL", "FREET4", "T3FREE", "THYROIDAB" in the last 72 hours. Anemia Panel: No results for input(s): "VITAMINB12", "FOLATE", "FERRITIN", "TIBC", "IRON", "RETICCTPCT" in the last 72 hours. Sepsis Labs: No results for input(s): "PROCALCITON", "LATICACIDVEN" in the last 168 hours.  Recent Results (from the past 240 hour(s))  SARS Coronavirus 2 by RT PCR (hospital order, performed in [facility] hospital lab)  *cepheid single result test* Anterior Nasal Swab     Status: None   Collection Time: 03/24/22  9:17 AM   Specimen: Anterior Nasal Swab  Result Value Ref Range Status   SARS Coronavirus 2 by RT PCR NEGATIVE NEGATIVE Final    Comment: (NOTE) SARS-CoV-2 target nucleic acids are NOT DETECTED.  The SARS-CoV-2 RNA is generally detectable in upper and lower respiratory specimens during the acute phase of infection. The lowest concentration of SARS-CoV-2 viral copies this assay can detect is 250 copies / mL. A negative result does not preclude SARS-CoV-2 infection and should not be used as the sole basis for treatment or other patient management decisions.  A negative result may occur with improper specimen collection / handling, submission of specimen other than nasopharyngeal swab, presence of viral mutation(s) within the areas targeted by this assay, and inadequate number of viral copies (<250 copies / mL). A negative result must be combined with clinical observations, patient history, and epidemiological information.  Fact Sheet for Patients:   https://www.patel.info/  Fact Sheet for Healthcare Providers: https://hall.com/  This test is not yet approved or  cleared by the [place] FDA and has been authorized for detection and/or diagnosis of SARS-CoV-2 by FDA under an Emergency Use Authorization (EUA).  This EUA will remain in effect (meaning  this test can be used) for the duration of the COVID-19 declaration under Section 564(b)(1) of the Act, 21 U.S.C. section 360bbb-3(b)(1), unless the authorization is terminated or revoked sooner.  Performed at [facility], [place] [address] [place], [place] [address]          Radiology Studies: No results found.      Scheduled Meds:  budesonide (PULMICORT) nebulizer solution  0.5 mg Nebulization BID   busPIRone  5 mg Oral TID   enoxaparin (LOVENOX) injection  30 mg  Subcutaneous Q24H   famotidine  40 mg Oral Daily   guaiFENesin  600 mg Oral BID   ipratropium-albuterol  3 mL Nebulization TID   levofloxacin  500 mg Oral Daily   levothyroxine  137 mcg Oral QAC breakfast   magnesium oxide  400 mg Oral Daily   methylPREDNISolone (SOLU-MEDROL) injection  40 mg Intravenous Q12H   oxybutynin  5 mg Oral TID   pantoprazole  40 mg Oral Daily   QUEtiapine  300 mg Oral QHS   rosuvastatin  20 mg Oral Daily   Continuous Infusions:          Aline August, MD [facility] 03/26/2022, 9:43 AM

## 2022-03-26 NOTE — Evaluation (Signed)
Physical Therapy Evaluation Patient Details Name: Tracy Park MRN: [id number] DOB: [birth year] Today's Date: 03/26/2022  History of Present Illness  66 yo female admitted with COPD exac. Hx of lung ca, MI, PTSD, bipolar d/o, COPD, flu  Clinical Impression  On eval, pt was Min guard assist to ambulate in the hallway without a device. She is unsteady at times. O2 fluctuated between 87-91% on RA. She tolerated distance well. Pt reports she plans to return home at d/c and she is hopeful to move to [facility] soon. Will plan to follow pt during this hospital stay. Do not anticipate any f/u PT needs at this time. Pt stated she has a RW and cane at home already.       Recommendations for follow up therapy are one component of a multi-disciplinary discharge planning process, led by the attending physician.  Recommendations may be updated based on patient status, additional functional criteria and insurance authorization.  Follow Up Recommendations No PT follow up      Assistance Recommended at Discharge PRN  Patient can return home with the following  Assist for transportation;Assistance with cooking/housework    Equipment Recommendations None recommended by PT  Recommendations for Other Services       Functional Status Assessment Patient has had a recent decline in their functional status and demonstrates the ability to make significant improvements in function in a reasonable and predictable amount of time.     Precautions / Restrictions Precautions Precautions: Fall Precaution Comments: monitor O2 Restrictions Weight Bearing Restrictions: No      Mobility  Bed Mobility Overal bed mobility: Modified Independent                  Transfers Overall transfer level: Modified independent                      Ambulation/Gait Ambulation/Gait assistance: Min guard Gait Distance (Feet): 100 Feet Assistive device: None Gait Pattern/deviations: Step-through pattern,  Decreased stride length       General Gait Details: Intermittent instability but no overt LOB. O2 fluctuated between 87-91% on RA  Stairs            Wheelchair Mobility    Modified Rankin (Stroke Patients Only)       Balance Overall balance assessment: Needs assistance         Standing balance support: During functional activity, No upper extremity supported Standing balance-Leahy Scale: Fair                               Pertinent Vitals/Pain Pain Assessment Pain Assessment: No/denies pain    Home Living Family/patient expects to be discharged to:: Private residence Living Arrangements: Alone   Type of Home: Mobile home Home Access: Stairs to enter Entrance Stairs-Rails: Right Entrance Stairs-Number of Steps: 4   Home Layout: One level Home Equipment: Cane - single Barista (2 wheels) Additional Comments: pt reports she is supposed to wear O2 24/7 (she also mentioned she is supposed to wear if O2 sat level drops below 89%)    Prior Function Prior Level of Function : Independent/Modified Independent             Mobility Comments: States she would use SPC in home, rarely leaves home. ADLs Comments: completes ADLs and IADLs independently     Hand Dominance        Extremity/Trunk Assessment   Upper Extremity Assessment Upper Extremity  Assessment: Overall WFL for tasks assessed    Lower Extremity Assessment Lower Extremity Assessment: Generalized weakness    Cervical / Trunk Assessment Cervical / Trunk Assessment: Normal  Communication   Communication: No difficulties  Cognition Arousal/Alertness: Awake/alert Behavior During Therapy: WFL for tasks assessed/performed Overall Cognitive Status: Within Functional Limits for tasks assessed                                          General Comments      Exercises     Assessment/Plan    PT Assessment Patient needs continued PT services  PT Problem  List Decreased balance;Decreased activity tolerance;Decreased mobility       PT Treatment Interventions DME instruction;Gait training;Functional mobility training;Therapeutic activities;Balance training;Patient/family education;Therapeutic exercise    PT Goals (Current goals can be found in the Care Plan section)  Acute Rehab PT Goals Patient Stated Goal: to get to [place] to be with family PT Goal Formulation: With patient Time For Goal Achievement: 04/09/22 Potential to Achieve Goals: Good    Frequency Min 3X/week     Co-evaluation               AM-PAC PT "6 Clicks" Mobility  Outcome Measure Help needed turning from your back to your side while in a flat bed without using bedrails?: None Help needed moving from lying on your back to sitting on the side of a flat bed without using bedrails?: None Help needed moving to and from a bed to a chair (including a wheelchair)?: None Help needed standing up from a chair using your arms (e.g., wheelchair or bedside chair)?: None Help needed to walk in hospital room?: A Little Help needed climbing 3-5 steps with a railing? : A Little 6 Click Score: 22    End of Session Equipment Utilized During Treatment: Gait belt Activity Tolerance: Patient tolerated treatment well Patient left: in bed;with call bell/phone within reach   PT Visit Diagnosis: Unsteadiness on feet (R26.81)    Time: 8185-6314 PT Time Calculation (min) (ACUTE ONLY): 21 min   Charges:   PT Evaluation $PT Eval Moderate Complexity: 1 Mod           Doreatha Massed, PT Acute Rehabilitation  Office: [phone] Pager: [phone]

## 2022-03-26 NOTE — Consult Note (Signed)
Consultation Note Date: 03/26/2022   Patient Name: Tracy Park  DOB: Sep 20, 1955  MRN: [id number]  Age / Sex: 66 y.o., female  PCP: Irving Shows, PA-C Referring Physician: Aline August, MD  Reason for Consultation: Establishing goals of care  HPI/Patient Profile: 66 y.o. female  with past medical history of left upper lobe adenocarcinoma status post left upper lobectomy (2019, chemotherapy (, COPD with chronic hypoxic respiratory failure (2 L nasal cannula baseline), OSA, hypothyroidism, hyperlipidemia, GERD, ongoing tobacco use, anxiety, depression, bipolar disorder, and recent multiple hospitalizations for COPD exacerbations with pneumonia (last admission 7/5-7/8-septic shock with pneumonia) admitted on 03/24/2022 with worsening shortness of breath.   Patient is being treated for COPD exacerbation.  Palliative medicine team was consulted to discuss goals of care and clarify patient's CODE STATUS.  Clinical Assessment and Goals of Care: I have reviewed medical records including EPIC notes, labs and imaging, assessed the patient and then met with patient at bedside to discuss diagnosis prognosis, GOC, EOL wishes, disposition and options.  I introduced Palliative Medicine as specialized medical care for people living with serious illness. It focuses on providing relief from the symptoms and stress of a serious illness. The goal is to improve quality of life for both the patient and the family.  We discussed a brief life review of the patient.  Patient shared she has been married 3 times.  She has 2 daughters, one daughter who is homeless and addicted to methamphetamine and another daughter Janett Billow who lives nearby and according to the patient "wants nothing to do with me".  Patient shares she lives alone in a mobile home.   As far as functional and nutritional status PTA patient endorses she is  independent with all ADLs.  She says she "eats like [place]" but has lost a significant amount of weight over the last 2 years.  She attributes weight loss to her depression and anxiety.  We discussed patient's current illness and what it means in the larger context of patient's on-going co-morbidities.  Patient says she knows that her 1 lung has cancer but her other 1 is just fine.  She attributes her multiple COPD exacerbations to her mental state.  She denies suicidal and homicidal ideation, but endorses she suffers from depression, anxiety, and PTSD that contributes to her overall poor health.  I attempted to outline that patient's COPD is a chronic, irreversible disease.  I highlighted that COPD is main source of respiratory issues but can be compounded with anxiety and depression.  Patient did not acknowledge that COPD is a chronic illness that she suffers from but rather acknowledged that her mental wellbeing is what is guiding her physical wellbeing.   I attempted to elicit values and goals of care important to the patient.  Patient states she wants to start focusing on herself.  She plans to move to [place].  Patient states she has been evicted from her mobile home and has 8 days out of 10 left on her eviction notice.  Patient shares she  is all packed up and ready to move to [place] to be near her sister.  Advance directives, concepts specific to code status, artificial feeding and hydration, and rehospitalization were considered and discussed.  Patient states that her sister would be her decision maker in the event she is unable to speak for herself.  However, patient has no formal advanced directives in place.  Patient declined to speak with spiritual care to formalize advanced directive documentation.  I discussed CODE STATUS with patient.  Patient states that she would like to remain a full code.  She believes that she will not die alone if she remains a full code.  We discussed the use of CPR,  defibrillation, and mechanical ventilation in the event of a cardiopulmonary arrest.  Patient was in agreement that she would want to have an attempt at resuscitation and is in agreement to being placed on a ventilator if needed.  Full code remains  Education offered regarding concept specific to human mortality and the limitations of medical interventions to prolong life when the body begins to fail to thrive.  Patient states that she has several people telling her that it is against her religion to not attempt CPR.  Therapeutic silence and active listening provided for patient to share her thoughts and emotions regarding her current health status.  Discussed importance of patient making decisions and having agency over her own wellbeing and advanced care planning.   Discussed with patient the importance of continued conversation with family and the medical providers regarding overall plan of care and treatment options, ensuring decisions are within the context of the patient's values and GOCs.  Patient is focused on moving to [place] and establishing psychiatric and primary care there.    Palliative outpatient services described in detail.  Patient declined services at this time.  Questions and concerns were addressed. The family was encouraged to call with questions or concerns.   Primary Decision Maker PATIENT  Code Status/Advance Care Planning: Full code  Prognosis:   Unable to determine  Discharge Planning: Home - pt declined outpatient palliative services  Primary Diagnoses: Present on Admission:  COPD with acute exacerbation ([place])  Chronic respiratory failure with hypoxia (HCC)  GERD (gastroesophageal reflux disease)  Dyslipidemia  Hypothyroidism (acquired)  Nicotine abuse  Normocytic anemia  COPD exacerbation (HCC)  Leukocytosis   Physical Exam Constitutional:      Comments: Thin  Eyes:     Pupils: Pupils are equal, round, and reactive to light.  Cardiovascular:      Rate and Rhythm: Normal rate.  Pulmonary:     Breath sounds: Examination of the left-upper field reveals wheezing. Examination of the left-middle field reveals wheezing. Wheezing present.  Chest:     Chest wall: No mass or tenderness.  Abdominal:     Palpations: Abdomen is soft.  Musculoskeletal:        General: Normal range of motion.     Cervical back: Normal range of motion.  Skin:    General: Skin is warm and dry.  Neurological:     Mental Status: She is alert and oriented to person, place, and time.  Psychiatric:        Mood and Affect: Mood normal. Mood is not anxious.        Behavior: Behavior normal. Behavior is not agitated.     Palliative Assessment/Data: 70%     I discussed this patient's plan of care with patient, RN.  Thank you for this consult. Palliative medicine will continue  to follow and assist holistically.   Time Total: 75 minutes Greater than 50%  of this time was spent counseling and coordinating care related to the above assessment and plan.  Signed by: Jordan Hawks, DNP, FNP-BC Palliative Medicine    Please contact Palliative Medicine Team phone at [phone] for questions and concerns.  For individual provider: See Shea Evans

## 2022-03-27 DIAGNOSIS — J441 Chronic obstructive pulmonary disease with (acute) exacerbation: Secondary | ICD-10-CM | POA: Diagnosis not present

## 2022-03-27 LAB — CBC WITH DIFFERENTIAL/PLATELET
Abs Immature Granulocytes: 0.04 10*3/uL (ref 0.00–0.07)
Basophils Absolute: 0 10*3/uL (ref 0.0–0.1)
Basophils Relative: 0 %
Eosinophils Absolute: 0 10*3/uL (ref 0.0–0.5)
Eosinophils Relative: 0 %
HCT: 35.6 % — ABNORMAL LOW (ref 36.0–46.0)
Hemoglobin: 10.4 g/dL — ABNORMAL LOW (ref 12.0–15.0)
Immature Granulocytes: 1 %
Lymphocytes Relative: 16 %
Lymphs Abs: 1.1 10*3/uL (ref 0.7–4.0)
MCH: 27.6 pg (ref 26.0–34.0)
MCHC: 29.2 g/dL — ABNORMAL LOW (ref 30.0–36.0)
MCV: 94.4 fL (ref 80.0–100.0)
Monocytes Absolute: 0.2 10*3/uL (ref 0.1–1.0)
Monocytes Relative: 3 %
Neutro Abs: 5.7 10*3/uL (ref 1.7–7.7)
Neutrophils Relative %: 80 %
Platelets: 545 10*3/uL — ABNORMAL HIGH (ref 150–400)
RBC: 3.77 MIL/uL — ABNORMAL LOW (ref 3.87–5.11)
RDW: 18.7 % — ABNORMAL HIGH (ref 11.5–15.5)
WBC: 7.1 10*3/uL (ref 4.0–10.5)
nRBC: 0 % (ref 0.0–0.2)

## 2022-03-27 LAB — BASIC METABOLIC PANEL
Anion gap: 8 (ref 5–15)
BUN: 25 mg/dL — ABNORMAL HIGH (ref 8–23)
CO2: 27 mmol/L (ref 22–32)
Calcium: 9.2 mg/dL (ref 8.9–10.3)
Chloride: 99 mmol/L (ref 98–111)
Creatinine, Ser: 0.5 mg/dL (ref 0.44–1.00)
GFR, Estimated: >60 mL/min (ref >60)
Glucose, Bld: 121 mg/dL — ABNORMAL HIGH (ref 70–99)
Potassium: 3.9 mmol/L (ref 3.5–5.1)
Sodium: 134 mmol/L — ABNORMAL LOW (ref 135–145)

## 2022-03-27 LAB — MAGNESIUM: Magnesium: 1.7 mg/dL (ref 1.7–2.4)

## 2022-03-27 MED ORDER — AMOXICILLIN-POT CLAVULANATE 875-125 MG PO TABS
1.0000 | ORAL_TABLET | Freq: Two times a day (BID) | ORAL | Status: DC
  Administered 2022-03-27 – 2022-03-28 (×2): 1 via ORAL
  Filled 2022-03-27 (×3): qty 1

## 2022-03-27 NOTE — Progress Notes (Signed)
PROGRESS NOTE  Tracy Park CHY:[id number] DOB: [birth year] DOA: 03/24/2022 PCP: Irving Shows, PA-C   LOS: 2 days   Brief Narrative / Interim history: 66 y.o. female with medical history significant of left upper lobe adenocarcinoma s/p left upper lobectomy in 2019 and chemotherapy, COPD with chronic hypoxic respiratory failure on 2 L, OSA, hypothyroidism, hyperlipidemia, GERD, ongoing tobacco, anxiety/depression/bipolar disorder, recent multiple hospitalizations for COPD exacerbation/pneumonia with last hospitalization from 03/13/2022-03/16/2022 for septic shock secondary to pneumonia presented with worsening shortness of breath.  On presentation, WBCs was 14.6, COVID testing was negative; chest x-ray showed minimal left basilar atelectasis with chronic postsurgical changes of the left hemithorax.    Subjective / 24h Interval events: Still complains of significant shortness of breath as well as persistent wheezing.  Assesement and Plan: Principal Problem:   COPD with acute exacerbation ([place]) Active Problems:   Hypothyroidism (acquired)   GERD (gastroesophageal reflux disease)   Nicotine abuse   Leukocytosis   Chronic respiratory failure with hypoxia (HCC)   Dyslipidemia   Normocytic anemia   COPD exacerbation (HCC)   Thrombocytosis   Principal problem COPD exacerbation, chronic hypoxic respiratory failure-Has had multiple recent hospitalizations for COPD exacerbation and pneumonia.  Unfortunately she is still smoking.  Continues to have wheezing and shortness of breath.  Has increased cough, start Augmentin.  Continue steroids, nebulizers and supportive care  Active problems Adenocarcinoma of lung -Status post left upper lobectomy in 2019 and chemotherapy; currently in remission.  Follows up with oncology at [facility]   Leukocytosis -Possibly reactive.  WBC improved  Thrombocytosis -Possibly reactive.  Monitor intermittently  Anemia of chronic disease -Possibly from  chronic illnesses.  No bleeding, hemoglobin stable   Severe protein-calorie malnutrition -RD consult   Hypothyroidism -Continue levothyroxine   GERD -Continue PPI and famotidine    Hyperlipidemia -continue statin   Anxiety/depression/bipolar disorder -Continue buspirone, quetiapine   Physical deconditioning -PT eval  Scheduled Meds:  budesonide (PULMICORT) nebulizer solution  0.5 mg Nebulization BID   busPIRone  5 mg Oral TID   enoxaparin (LOVENOX) injection  30 mg Subcutaneous Q24H   famotidine  40 mg Oral Daily   guaiFENesin  600 mg Oral BID   ipratropium-albuterol  3 mL Nebulization TID   levothyroxine  137 mcg Oral QAC breakfast   magnesium oxide  400 mg Oral Daily   methylPREDNISolone (SOLU-MEDROL) injection  80 mg Intravenous Q12H   oxybutynin  5 mg Oral TID   pantoprazole  40 mg Oral Daily   QUEtiapine  300 mg Oral QHS   rosuvastatin  20 mg Oral Daily   Continuous Infusions: PRN Meds:.acetaminophen **OR** acetaminophen, albuterol, HYDROcodone-acetaminophen, methocarbamol, nitroGLYCERIN, ondansetron **OR** ondansetron (ZOFRAN) IV, polyvinyl alcohol  Diet Orders (From admission, onward)     Start     Ordered   03/24/22 1504  Diet Heart Room service appropriate? Yes; Fluid consistency: Thin  Diet effective now       Question Answer Comment  Room service appropriate? Yes   Fluid consistency: Thin      03/24/22 1503            DVT prophylaxis: enoxaparin (LOVENOX) injection 30 mg Start: 03/24/22 1200   Lab Results  Component Value Date   PLT 545 (H) 03/27/2022      Code Status: Full Code  Family Communication: No family at bedside  Status is: Inpatient  Remains inpatient appropriate because: persistently symptomatic   Level of care: Med-Surg  Consultants:  none   Objective: Vitals:  03/27/22 0600 03/27/22 0845 03/27/22 0849 03/27/22 1319  BP: (!) 134/91     Pulse: 71     Resp: 18     Temp: 97.8 F (36.6 C)     TempSrc: Oral     SpO2:  100% 100% 97% 96%  Weight:      Height:        Intake/Output Summary (Last 24 hours) at 03/27/2022 1351 Last data filed at 03/27/2022 0900 Gross per 24 hour  Intake 840 ml  Output --  Net 840 ml   Wt Readings from Last 3 Encounters:  03/24/22 47.3 kg  03/20/22 45.4 kg  03/20/22 45.4 kg    Examination:  Constitutional: NAD Eyes: no scleral icterus ENMT: Mucous membranes are moist.  Neck: normal, supple Respiratory: clear to auscultation bilaterally, no wheezing, no crackles. Normal respiratory effort. No accessory muscle use.  Cardiovascular: Regular rate and rhythm, no murmurs / rubs / gallops. No LE edema.  Abdomen: non distended, no tenderness. Bowel sounds positive.  Musculoskeletal: no clubbing / cyanosis.  Skin: no rashes Neurologic: non focal  Data Reviewed: I have independently reviewed following labs and imaging studies   CBC Recent Labs  Lab 03/24/22 0905 03/27/22 0551  WBC 14.6* 7.1  HGB 9.9* 10.4*  HCT 32.6* 35.6*  PLT 502* 545*  MCV 89.6 94.4  MCH 27.2 27.6  MCHC 30.4 29.2*  RDW 18.3* 18.7*  LYMPHSABS 2.2 1.1  MONOABS 0.8 0.2  EOSABS 0.1 0.0  BASOSABS 0.1 0.0    Recent Labs  Lab 03/24/22 0905 03/27/22 0551  NA 145 134*  K 4.0 3.9  CL 109 99  CO2 26 27  GLUCOSE 117* 121*  BUN 16 25*  CREATININE 0.57 0.50  CALCIUM 9.4 9.2  MG  --  1.7    ------------------------------------------------------------------------------------------------------------------ No results for input(s): "CHOL", "HDL", "LDLCALC", "TRIG", "CHOLHDL", "LDLDIRECT" in the last 72 hours.  Lab Results  Component Value Date   HGBA1C 5.9 (H) 08/20/2021   ------------------------------------------------------------------------------------------------------------------ No results for input(s): "TSH", "T4TOTAL", "T3FREE", "THYROIDAB" in the last 72 hours.  Invalid input(s): "FREET3"  Cardiac Enzymes No results for input(s): "CKMB", "TROPONINI", "MYOGLOBIN" in the last  168 hours.  Invalid input(s): "CK" ------------------------------------------------------------------------------------------------------------------    Component Value Date/Time   BNP 96.6 03/06/2022 0402    CBG: No results for input(s): "GLUCAP" in the last 168 hours.  Recent Results (from the past 240 hour(s))  SARS Coronavirus 2 by RT PCR (hospital order, performed in [facility] hospital lab) *cepheid single result test* Anterior Nasal Swab     Status: None   Collection Time: 03/24/22  9:17 AM   Specimen: Anterior Nasal Swab  Result Value Ref Range Status   SARS Coronavirus 2 by RT PCR NEGATIVE NEGATIVE Final    Comment: (NOTE) SARS-CoV-2 target nucleic acids are NOT DETECTED.  The SARS-CoV-2 RNA is generally detectable in upper and lower respiratory specimens during the acute phase of infection. The lowest concentration of SARS-CoV-2 viral copies this assay can detect is 250 copies / mL. A negative result does not preclude SARS-CoV-2 infection and should not be used as the sole basis for treatment or other patient management decisions.  A negative result may occur with improper specimen collection / handling, submission of specimen other than nasopharyngeal swab, presence of viral mutation(s) within the areas targeted by this assay, and inadequate number of viral copies (<250 copies / mL). A negative result must be combined with clinical observations, patient history, and epidemiological information.  Fact Sheet  for Patients:   https://www.patel.info/  Fact Sheet for Healthcare Providers: https://hall.com/  This test is not yet approved or  cleared by the [place] FDA and has been authorized for detection and/or diagnosis of SARS-CoV-2 by FDA under an Emergency Use Authorization (EUA).  This EUA will remain in effect (meaning this test can be used) for the duration of the COVID-19 declaration under Section 564(b)(1) of  the Act, 21 U.S.C. section 360bbb-3(b)(1), unless the authorization is terminated or revoked sooner.  Performed at [facility], [place] [address], [place],  [address]      Radiology Studies: No results found.   Marzetta Board, MD, PhD [facility]  Between 7 am - 7 pm I am available, please contact me via Amion (for emergencies) or Securechat (non urgent messages)  Between 7 pm - 7 am I am not available, please contact night coverage MD/APP via Amion

## 2022-03-28 DIAGNOSIS — J441 Chronic obstructive pulmonary disease with (acute) exacerbation: Secondary | ICD-10-CM | POA: Diagnosis not present

## 2022-03-28 MED ORDER — DOXYCYCLINE HYCLATE 100 MG PO CAPS
100.0000 mg | ORAL_CAPSULE | Freq: Two times a day (BID) | ORAL | 0 refills | Status: AC
Start: 2022-03-28 — End: 2022-03-31

## 2022-03-28 MED ORDER — PREDNISONE 10 MG PO TABS: ORAL_TABLET | ORAL | 0 refills | Status: AC

## 2022-03-28 MED ORDER — NICOTINE 14 MG/24HR TD PT24: 14.0000 mg | MEDICATED_PATCH | TRANSDERMAL | 0 refills | Status: DC

## 2022-03-28 MED ORDER — NYSTATIN 100000 UNIT/ML MT SUSP: 5.0000 mL | Freq: Four times a day (QID) | OROMUCOSAL | 0 refills | Status: DC

## 2022-03-28 NOTE — Care Management Important Message (Signed)
Important Message  Patient Details IM Letter given to the Patient. Name: Tracy Park MRN: [id number] Date of Birth: 15-Feb-1956   Medicare Important Message Given:  Yes     Kerin Salen 03/28/2022, 10:34 AM

## 2022-03-28 NOTE — Plan of Care (Signed)
°  Problem: Education: °Goal: Knowledge of disease or condition will improve °Outcome: Progressing °Goal: Knowledge of the prescribed therapeutic regimen will improve °Outcome: Progressing °Goal: Individualized Educational Video(s) °Outcome: Progressing °  °

## 2022-03-28 NOTE — TOC Transition Note (Signed)
Transition of Care [facility]) - CM/SW Discharge Note   Patient Details  Name: Tracy Park MRN: [id number] Date of Birth: Jan 10, 1956  Transition of Care [facility]) CM/SW Contact:  Vassie Moselle, LCSW Phone Number: 03/28/2022, 10:28 AM   Clinical Narrative:    Pt states she has someone that is able to provide transportation home from the hospital and no longer needs assistance with transportation. No further TOC needs identified at this time.    Final next level of care: Home/Self Care Barriers to Discharge: Barriers Resolved   Patient Goals and CMS Choice Patient states their goals for this hospitalization and ongoing recovery are:: To return home   Choice offered to / list presented to : Patient  Discharge Placement                       Discharge Plan and Services In-house Referral: Clinical Social Work Discharge Planning Services: CM Consult Post Acute Care Choice: NA          DME Arranged: N/A DME Agency: NA                  Social Determinants of Health (SDOH) Interventions     Readmission Risk Interventions    03/01/2022    9:00 AM  Readmission Risk Prevention Plan  Transportation Screening Complete  PCP or Specialist Appt within 5-7 Days Complete  Home Care Screening Complete  Medication Review (RN CM) Complete

## 2022-03-28 NOTE — Discharge Summary (Signed)
Physician Discharge Summary  Tracy Park DUK:[id number] DOB: 02-03-1956 DOA: 03/24/2022  PCP: Irving Shows, PA-C  Admit date: 03/24/2022 Discharge date: 03/28/2022  Admitted From: home Disposition:  home  Recommendations for Outpatient Follow-up:  Follow up with PCP in 1-2 weeks Please obtain BMP/CBC in one week  Home Health: none Equipment/Devices: none  Discharge Condition: stable CODE STATUS: Full code Diet Orders (From admission, onward)     Start     Ordered   03/24/22 1504  Diet Heart Room service appropriate? Yes; Fluid consistency: Thin  Diet effective now       Question Answer Comment  Room service appropriate? Yes   Fluid consistency: Thin      03/24/22 1503            HPI: Per admitting MD,  Tracy Park is a 66 y.o. female with medical history significant of CAD, anginal pain, history of MI, anxiety, depression, bipolar disorder, COPD, dyspnea, cholelithiasis, hydroureteronephrosis, hypothyroidism, lung cancer, sleep apnea who is returning to the emergency department due to progressively worse dyspnea associated with wheezing and productive cough of whitish sputum despite using her inhalers.  She was given amoxicillin on her previous visit, but just got it a day ago.  She has had some night sweats.  She denied fever, chills, rhinorrhea, sore throat or hemoptysis.  No chest pain, palpitations, diaphoresis, PND, orthopnea or pitting edema of the lower extremities.  No abdominal pain, nausea, emesis, diarrhea, constipation, melena or hematochezia.  No flank pain, dysuria, frequency or hematuria.  No polyuria, polydipsia, polyphagia or blurred vision. ED course: Initial vital signs were temperature 97.6 F, pulse 95, respiration 19, BP 123/75 mmHg O2 sat 94% on room air.  The patient received magnesium sulfate 2 g IVPB, methylprednisolone 125 mg IVP and a 10 mg continuous albuterol nebulization. Lab work: CBC showed a white count of 14.6, hemoglobin 9.9 g/dL  platelets 502.  Coronavirus PCR was negative.  BMP showed a glucose of 117 mg/dL, but all other values were normal. Imaging: Portable 1 view chest radiograph showed minimal left basilar atelectasis with chronic postsurgical changes of the left hemithorax.  Hospital Course / Discharge diagnoses: Principal Problem:   COPD with acute exacerbation ([place]) Active Problems:   Hypothyroidism (acquired)   GERD (gastroesophageal reflux disease)   Nicotine abuse   Leukocytosis   Chronic respiratory failure with hypoxia (HCC)   Dyslipidemia   Normocytic anemia   COPD exacerbation (HCC)   Thrombocytosis   Principal problem COPD exacerbation, chronic hypoxic respiratory failure-patient was admitted to the hospital with shortness of breath, acute on chronic hypoxic respiratory failure felt to be in the setting of COPD exacerbation.  She was placed on steroids, nebulizers, along with antibiotics with improvement in her respiratory status.  Her wheezing has resolved, she is feeling close to normal, able to ambulate in the hallway and will be discharged home in stable condition.  She is to complete 3 more days of additional antibiotics, prednisone taper.    Active problems Adenocarcinoma of lung -Status post left upper lobectomy in 2019 and chemotherapy; currently in remission.  Follows up with oncology at [facility]  Leukocytosis -Possibly reactive.  WBC improved Thrombocytosis -Possibly reactive.  Monitor intermittently Anemia of chronic disease -Possibly from chronic illnesses.  No bleeding, hemoglobin stable Severe protein-calorie malnutrition -RD consult Hypothyroidism -Continue levothyroxine GERD -Continue home regimen Hyperlipidemia -continue statin Tobacco use-counseled for cessation, nicotine patch prescribed Oral thrush-likely in the setting of systemic and inhaled steroids.  Short course of nystatin Anxiety/depression/bipolar disorder -Continue buspirone, quetiapine  Sepsis ruled  out   Discharge Instructions   Allergies as of 03/28/2022       Reactions   Red Dye Hives, Itching   Oxycontin [oxycodone Hcl] Other (See Comments)   Hallucinations    Prednisone Hives, Nausea And Vomiting   Aspirin Hives   Tape Rash   Prefers paper tape   Wound Dressing Adhesive Rash        Medication List     STOP taking these medications    amoxicillin-clavulanate 1000-62.5 MG 12 hr tablet Commonly known as: AUGMENTIN XR       TAKE these medications    acetaminophen 325 MG tablet Commonly known as: TYLENOL Take 2 tablets (650 mg total) by mouth every 6 (six) hours as needed for mild pain (or Fever >/= 101).   busPIRone 5 MG tablet Commonly known as: BUSPAR Take 5 mg by mouth 3 (three) times daily.   Centrum Women Tabs Take 1 tablet by mouth daily.   doxycycline 100 MG capsule Commonly known as: VIBRAMYCIN Take 1 capsule (100 mg total) by mouth 2 (two) times daily for 3 days.   famotidine 40 MG tablet Commonly known as: PEPCID Take 40 mg by mouth daily.   guaiFENesin 600 MG 12 hr tablet Commonly known as: MUCINEX Take 1 tablet (600 mg total) by mouth 2 (two) times daily.   HYDROcodone-acetaminophen 5-325 MG tablet Commonly known as: NORCO/VICODIN Take 1 tablet by mouth every 4 (four) hours as needed for pain.   ipratropium-albuterol 0.5-2.5 (3) MG/3ML Soln Commonly known as: DUONEB Take 3 mLs by nebulization every 6 (six) hours as needed (shortness of breath).   levothyroxine 137 MCG tablet Commonly known as: SYNTHROID Take 137 mcg by mouth daily before breakfast.   lidocaine 5 % Commonly known as: Lidoderm Place 1 patch onto the skin daily. Remove & Discard patch within 12 hours or as directed by MD   Magnesium Oxide 400 MG Caps Take 1 capsule (400 mg total) by mouth daily at 12 noon.   meloxicam 15 MG tablet Commonly known as: MOBIC Take 15 mg by mouth daily as needed for pain.   methocarbamol 500 MG tablet Commonly known as:  ROBAXIN Take 500 mg by mouth 2 (two) times daily as needed for muscle spasms.   nitroGLYCERIN 0.4 MG SL tablet Commonly known as: NITROSTAT Place 0.4 mg under the tongue every 5 (five) minutes as needed for chest pain.   nystatin 100000 UNIT/ML suspension Commonly known as: MYCOSTATIN Take 5 mLs (500,000 Units total) by mouth 4 (four) times daily.   omeprazole 20 MG capsule Commonly known as: PRILOSEC Take 20 mg by mouth See admin instructions. 20 mg once daily. May take an additional 20 mg PRN heartburn   oxybutynin 5 MG tablet Commonly known as: DITROPAN Take 5 mg by mouth 3 (three) times daily.   OXYGEN Inhale 2 L into the lungs daily as needed (for low oxygen).   predniSONE 10 MG tablet Commonly known as: DELTASONE Take 3 tablets (30 mg total) by mouth daily for 2 days, THEN 2 tablets (20 mg total) daily for 2 days, THEN 1 tablet (10 mg total) daily for 2 days. Start taking on: March 28, 2022   QUEtiapine 300 MG tablet Commonly known as: SEROQUEL Take 300 mg by mouth at bedtime.   rosuvastatin 20 MG tablet Commonly known as: CRESTOR Take 20 mg by mouth daily.   SYSTANE COMPLETE OP Place 1  drop into both eyes 2 (two) times daily as needed (dry eyes).   Trelegy Ellipta 100-62.5-25 MCG/ACT Aepb Generic drug: Fluticasone-Umeclidin-Vilant Take 1 puff by mouth daily.        Consultations: none  Procedures/Studies:   DG Chest Port 1 View  Result Date: 03/24/2022 CLINICAL DATA:  Cough.  History of lung cancer EXAM: PORTABLE CHEST 1 VIEW COMPARISON:  03/21/2022 FINDINGS: Chronic volume loss in the left hemithorax related to left upper lobectomy. Chronic scarring and pleural thickening at the left lung apex. Minimal left basilar atelectasis. Right lung is clear. No pleural effusion or pneumothorax. Heart size is within normal limits. IMPRESSION: Chronic postsurgical changes of the left hemithorax. Minimal left basilar atelectasis. Otherwise no acute process in the  chest. Electronically Signed   By: Davina Poke D.O.   On: 03/24/2022 10:00   DG Chest Port 1 View  Result Date: 03/21/2022 CLINICAL DATA:  Shortness of breath EXAM: PORTABLE CHEST 1 VIEW COMPARISON:  CTA chest dated 03/20/2022 FINDINGS: Volume loss in the left hemithorax in this patient with prior left upper lobectomy. Stable scarring with pleural thickening, cavitation, and parenchymal opacity in the apical segment left lower lobe. Right lung is clear.  No pleural effusion or pneumothorax. The heart is normal in size. IMPRESSION: Status post left upper lobectomy. Stable scarring, parenchymal opacity, and cavitation in the apical segment left lower lobe. Electronically Signed   By: Julian Hy M.D.   On: 03/21/2022 02:33   CT Angio Chest PE W and/or Wo Contrast  Result Date: 03/20/2022 CLINICAL DATA:  Abdominal pain.  Shortness of breath. EXAM: CT ANGIOGRAPHY CHEST CT ABDOMEN AND PELVIS WITH CONTRAST TECHNIQUE: Multidetector CT imaging of the chest was performed using the standard protocol during bolus administration of intravenous contrast. Multiplanar CT image reconstructions and MIPs were obtained to evaluate the vascular anatomy. Multidetector CT imaging of the abdomen and pelvis was performed using the standard protocol during bolus administration of intravenous contrast. RADIATION DOSE REDUCTION: This exam was performed according to the departmental dose-optimization program which includes automated exposure control, adjustment of the mA and/or kV according to patient size and/or use of iterative reconstruction technique. CONTRAST:  67mL OMNIPAQUE IOHEXOL 350 MG/ML SOLN COMPARISON:  02/25/2022, 03/13/2022 FINDINGS: CTA CHEST FINDINGS Cardiovascular: Satisfactory opacification of the pulmonary arteries to the segmental level. No evidence of pulmonary embolism. Normal heart size. No pericardial effusion. Thoracic aortic atherosclerosis. Coronary artery atherosclerosis. Mediastinum/Nodes: No  enlarged mediastinal, hilar, or axillary lymph nodes. Thyroid gland, trachea, and esophagus demonstrate no significant findings. Lungs/Pleura: Centrilobular emphysema. Persistent left apical consolidation similar to 03/13/2022. Postsurgical changes in the left upper lobe. No other areas of focal consolidation. No pleural effusion or pneumothorax. 5 mm right upper lobe pulmonary nodule. Musculoskeletal: No acute osseous abnormality. No aggressive osseous lesion. Review of the MIP images confirms the above findings. CT ABDOMEN and PELVIS FINDINGS Hepatobiliary: No focal liver abnormality is seen. No gallstones, gallbladder wall thickening, or biliary dilatation. Hydropic gallbladder. Pancreas: Unremarkable. No pancreatic ductal dilatation or surrounding inflammatory changes. Spleen: Normal in size without focal abnormality. Adrenals/Urinary Tract: Adrenal glands are unremarkable. Punctate nonobstructing right renal calculus. Right extrarenal pelvis. No obstructive uropathy. No renal mass. Stomach/Bowel: Stomach is within normal limits. No evidence of bowel wall thickening, distention, or inflammatory changes. Large amount of stool throughout the colon. Vascular/Lymphatic: Aortic atherosclerosis. No enlarged abdominal or pelvic lymph nodes. Reproductive: Status post hysterectomy. No adnexal masses. Other: No abdominal wall hernia or abnormality. No abdominopelvic ascites. Musculoskeletal: No acute osseous abnormality.  No aggressive osseous lesion. Grade 1 anterolisthesis of L5 on S1. Severe bilateral facet arthropathy at L5-S1. Review of the MIP images confirms the above findings. IMPRESSION: 1. No pulmonary embolism to the segmental level. 2. Persistent left apical consolidation similar to 03/13/2022 consistent with persistent infectious etiology. 3. A 5 mm right upper lobe pulmonary nodule. A non-contrast Chest CT at 12 months is optional. If performed and the nodule is stable at 12 months, no further follow-up is  recommended. These guidelines do not apply to immunocompromised patients and patients with cancer. Follow up in patients with significant comorbidities as clinically warranted. For lung cancer screening, adhere to Lung-RADS guidelines. Reference: Radiology. 2017; 284(1):228-43. 4. Large amount of stool throughout the colon. 5. Punctate nonobstructing right renal calculus. 6. Aortic Atherosclerosis (ICD10-I70.0) and Emphysema (ICD10-J43.9). Electronically Signed   By: Kathreen Devoid M.D.   On: 03/20/2022 10:57   CT ABDOMEN PELVIS W CONTRAST  Result Date: 03/20/2022 CLINICAL DATA:  Abdominal pain.  Shortness of breath. EXAM: CT ANGIOGRAPHY CHEST CT ABDOMEN AND PELVIS WITH CONTRAST TECHNIQUE: Multidetector CT imaging of the chest was performed using the standard protocol during bolus administration of intravenous contrast. Multiplanar CT image reconstructions and MIPs were obtained to evaluate the vascular anatomy. Multidetector CT imaging of the abdomen and pelvis was performed using the standard protocol during bolus administration of intravenous contrast. RADIATION DOSE REDUCTION: This exam was performed according to the departmental dose-optimization program which includes automated exposure control, adjustment of the mA and/or kV according to patient size and/or use of iterative reconstruction technique. CONTRAST:  33mL OMNIPAQUE IOHEXOL 350 MG/ML SOLN COMPARISON:  02/25/2022, 03/13/2022 FINDINGS: CTA CHEST FINDINGS Cardiovascular: Satisfactory opacification of the pulmonary arteries to the segmental level. No evidence of pulmonary embolism. Normal heart size. No pericardial effusion. Thoracic aortic atherosclerosis. Coronary artery atherosclerosis. Mediastinum/Nodes: No enlarged mediastinal, hilar, or axillary lymph nodes. Thyroid gland, trachea, and esophagus demonstrate no significant findings. Lungs/Pleura: Centrilobular emphysema. Persistent left apical consolidation similar to 03/13/2022. Postsurgical  changes in the left upper lobe. No other areas of focal consolidation. No pleural effusion or pneumothorax. 5 mm right upper lobe pulmonary nodule. Musculoskeletal: No acute osseous abnormality. No aggressive osseous lesion. Review of the MIP images confirms the above findings. CT ABDOMEN and PELVIS FINDINGS Hepatobiliary: No focal liver abnormality is seen. No gallstones, gallbladder wall thickening, or biliary dilatation. Hydropic gallbladder. Pancreas: Unremarkable. No pancreatic ductal dilatation or surrounding inflammatory changes. Spleen: Normal in size without focal abnormality. Adrenals/Urinary Tract: Adrenal glands are unremarkable. Punctate nonobstructing right renal calculus. Right extrarenal pelvis. No obstructive uropathy. No renal mass. Stomach/Bowel: Stomach is within normal limits. No evidence of bowel wall thickening, distention, or inflammatory changes. Large amount of stool throughout the colon. Vascular/Lymphatic: Aortic atherosclerosis. No enlarged abdominal or pelvic lymph nodes. Reproductive: Status post hysterectomy. No adnexal masses. Other: No abdominal wall hernia or abnormality. No abdominopelvic ascites. Musculoskeletal: No acute osseous abnormality. No aggressive osseous lesion. Grade 1 anterolisthesis of L5 on S1. Severe bilateral facet arthropathy at L5-S1. Review of the MIP images confirms the above findings. IMPRESSION: 1. No pulmonary embolism to the segmental level. 2. Persistent left apical consolidation similar to 03/13/2022 consistent with persistent infectious etiology. 3. A 5 mm right upper lobe pulmonary nodule. A non-contrast Chest CT at 12 months is optional. If performed and the nodule is stable at 12 months, no further follow-up is recommended. These guidelines do not apply to immunocompromised patients and patients with cancer. Follow up in patients with significant comorbidities as clinically warranted.  For lung cancer screening, adhere to Lung-RADS guidelines.  Reference: Radiology. 2017; 284(1):228-43. 4. Large amount of stool throughout the colon. 5. Punctate nonobstructing right renal calculus. 6. Aortic Atherosclerosis (ICD10-I70.0) and Emphysema (ICD10-J43.9). Electronically Signed   By: Kathreen Devoid M.D.   On: 03/20/2022 10:57   DG Chest 2 View  Result Date: 03/20/2022 CLINICAL DATA:  66 year old female with shortness of breath. Chronic lung disease and history of lung cancer. EXAM: CHEST - 2 VIEW COMPARISON:  Chest radiograph 03/19/2022, CTA 03/13/2022 and earlier. FINDINGS: Semi upright AP and lateral views of the chest. Left upper lung volume loss, architectural distortion and combined bronchiectasis and consolidation appears stable from last month. Stable leftward shift of the mediastinum and distorted superior mediastinal contours. Stable trachea. Underlying emphysema as demonstrated by CT. No pneumothorax or pleural effusion. No convincing acute pulmonary opacity. No acute osseous abnormality identified. Negative visible bowel gas. IMPRESSION: Chronic lung disease with unresolved left apical consolidation, emphysema. No new cardiopulmonary abnormality. Electronically Signed   By: Genevie Ann M.D.   On: 03/20/2022 08:26   DG Chest 2 View  Result Date: 03/19/2022 CLINICAL DATA:  Shortness of breath EXAM: CHEST - 2 VIEW COMPARISON:  Chest x-ray 03/13/2022.  Chest CT 03/13/2022. FINDINGS: Left apical pleural thickening and patchy opacities are unchanged. Cardiomediastinal silhouette is stable. No new focal lung infiltrate, pleural effusion or pneumothorax. No acute fractures. IMPRESSION: Stable left upper lobe pleural-parenchymal opacities. No acute lung infiltrate. Electronically Signed   By: Ronney Asters M.D.   On: 03/19/2022 19:07   CT Angio Chest PE W and/or Wo Contrast  Result Date: 03/13/2022 CLINICAL DATA:  Concern for pulmonary embolism. Short of breath. Lung pain. Home O2. history of lung cancer. * Tracking Code: BO * EXAM: CT ANGIOGRAPHY CHEST  WITH CONTRAST TECHNIQUE: Multidetector CT imaging of the chest was performed using the standard protocol during bolus administration of intravenous contrast. Multiplanar CT image reconstructions and MIPs were obtained to evaluate the vascular anatomy. RADIATION DOSE REDUCTION: This exam was performed according to the departmental dose-optimization program which includes automated exposure control, adjustment of the mA and/or kV according to patient size and/or use of iterative reconstruction technique. CONTRAST:  4mL OMNIPAQUE IOHEXOL 350 MG/ML SOLN COMPARISON:  CT 02/25/2022 FINDINGS: Cardiovascular: No evidence of acute pulmonary embolism within the RIGHT pulmonary arteries. There is truncation of the LEFT upper lobe pulmonary arteries related to prior lung cancer surgery. No evidence of LEFT lower lobe pulmonary embolism. Mediastinum/Nodes: No axillary or supraclavicular adenopathy. No mediastinal or hilar adenopathy. No pericardial fluid. Esophagus normal. Lungs/Pleura: There is some mild improvement in consolidation in the superior LEFT lung compared to CT 02/25/2022. Persistent consolidation remains albeit reduced. Centrilobular emphysema in the RIGHT upper lobe. No suspicious nodularity. Upper Abdomen: Limited view of the liver, kidneys, pancreas are unremarkable. Normal adrenal glands. Musculoskeletal: No aggressive osseous lesion. Review of the MIP images confirms the above findings. IMPRESSION: 1. No evidence acute pulmonary embolism. 2. Postsurgical change in the LEFT lung consistent with lobe LEFT upper lobectomy. 3. Improved consolidation in the LEFT upper lobe is favored resolving infectious process. Recommend follow-up CT to ensure resolution. Electronically Signed   By: Suzy Bouchard M.D.   On: 03/13/2022 20:56   DG Chest Port 1 View  Result Date: 03/13/2022 CLINICAL DATA:  History of lung cancer presenting with shortness of breath. EXAM: PORTABLE CHEST 1 VIEW COMPARISON:  March 06, 2022  FINDINGS: The heart size and mediastinal contours are within normal limits. Stable postoperative changes are  seen within the left apex and upper left lung with subsequent left apical scarring and pleural opacification. Stable left-sided volume loss is seen. The right lung is clear. Surgical clips are seen along the suprahilar and infrahilar regions on the left. There is no evidence of a pleural effusion or pneumothorax. The visualized skeletal structures are unremarkable. IMPRESSION: Stable postoperative changes in the left hemithorax as described above, without evidence of acute or active cardiopulmonary disease. Electronically Signed   By: Virgina Norfolk M.D.   On: 03/13/2022 20:21   DG Lumbar Spine Complete  Result Date: 03/11/2022 CLINICAL DATA:  Left-sided flank pain radiating into the left leg, initial encounter EXAM: LUMBAR SPINE - COMPLETE 4+ VIEW COMPARISON:  08/20/2021 FINDINGS: Five lumbar type vertebral bodies are well visualized. Vertebral body height is well maintained. No pars defects are noted. Degenerative anterolisthesis of L4 on L5 is noted. This is degenerative in nature as no pars defects are seen. IMPRESSION: Stable degenerative change with anterolisthesis of L4 on L5. Electronically Signed   By: Inez Catalina M.D.   On: 03/11/2022 20:49   DG Chest 2 View  Result Date: 03/07/2022 CLINICAL DATA:  Shortness of breath EXAM: CHEST - 2 VIEW COMPARISON:  03/06/2022.  CT 02/25/2022 FINDINGS: Postoperative changes on the left with volume loss. Persistent density in the left upper lung/apex, unchanged since prior study. Hyperinflation. Right lung clear. Heart is normal size. No effusions or acute bony abnormality. IMPRESSION: Postoperative changes on the left with stable chronic density in the left upper lung. No acute findings. Electronically Signed   By: Rolm Baptise M.D.   On: 03/07/2022 00:18   DG Chest Portable 1 View  Result Date: 03/06/2022 CLINICAL DATA:  Shortness of breath and  chest pain. EXAM: PORTABLE CHEST 1 VIEW COMPARISON:  Recent portable chest 02/28/2022, chest CT with contrast 02/25/2022 FINDINGS: Left-sided volume loss and upper lobectomy are again noted with persistent pleural-parenchymal consolidation in the apical left lung. The appearance is not significantly changed. The rest of the lungs clear with COPD.  The cardiac size is normal. No pneumothorax is seen with stable mediastinal configuration. No acute osseous abnormality. IMPRESSION: Persistent left apical pleural-parenchymal consolidation with coarse interstitial change superimposed on remote left upper lobectomy. Additional scattered opacities noted in the left base on the last study are not seen today. Electronically Signed   By: Telford Nab M.D.   On: 03/06/2022 02:29   DG Forearm Right  Result Date: 03/03/2022 CLINICAL DATA:  Recent fall with right forearm pain, initial encounter EXAM: RIGHT FOREARM - 2 VIEW COMPARISON:  None Available. FINDINGS: There is no evidence of fracture or other focal bone lesions. Mild soft tissue swelling is noted distally. IMPRESSION: Soft tissue swelling distally without acute bony abnormality. Electronically Signed   By: Inez Catalina M.D.   On: 03/03/2022 02:49   DG CHEST PORT 1 VIEW  Result Date: 02/28/2022 CLINICAL DATA:  [id number] chest pain, status post lung surgery for lung CA, COPD and MI EXAM: PORTABLE CHEST 1 VIEW COMPARISON:  February 25, 2022 FINDINGS: Heart and mediastinal contours are stable. Postsurgical changes with decrease in the volume of the left upper lobe with leftward tracheal deviation. There is opacity seen at the left upper lobe and has mildly improved in the interim. Remainder of the lung fields are clear. Blunting of the left CP angle. No pleural effusion or vascular congestion. IMPRESSION: The opacity in the left upper lobe has mildly decreased in the interim of improvement of pneumonia. Remainder of  the lung fields are clear. Electronically Signed   By:  Frazier Richards M.D.   On: 02/28/2022 07:58     Subjective: - no chest pain, shortness of breath, no abdominal pain, nausea or vomiting.   Discharge Exam: BP 116/75 (BP Location: Left Arm)   Pulse 77   Temp 97.7 F (36.5 C) (Oral)   Resp 18   Ht 5\' 3"  (1.6 m)   Wt 47.3 kg   SpO2 98%   BMI 18.47 kg/m   General: Pt is alert, awake, not in acute distress Cardiovascular: RRR, S1/S2 +, no rubs, no gallops Respiratory: CTA bilaterally, no wheezing, no rhonchi Abdominal: Soft, NT, ND, bowel sounds + Extremities: no edema, no cyanosis    The results of significant diagnostics from this hospitalization (including imaging, microbiology, ancillary and laboratory) are listed below for reference.     Microbiology: Recent Results (from the past 240 hour(s))  SARS Coronavirus 2 by RT PCR (hospital order, performed in [facility] [facility] hospital lab) *cepheid single result test* Anterior Nasal Swab     Status: None   Collection Time: 03/24/22  9:17 AM   Specimen: Anterior Nasal Swab  Result Value Ref Range Status   SARS Coronavirus 2 by RT PCR NEGATIVE NEGATIVE Final    Comment: (NOTE) SARS-CoV-2 target nucleic acids are NOT DETECTED.  The SARS-CoV-2 RNA is generally detectable in upper and lower respiratory specimens during the acute phase of infection. The lowest concentration of SARS-CoV-2 viral copies this assay can detect is 250 copies / mL. A negative result does not preclude SARS-CoV-2 infection and should not be used as the sole basis for treatment or other patient management decisions.  A negative result may occur with improper specimen collection / handling, submission of specimen other than nasopharyngeal swab, presence of viral mutation(s) within the areas targeted by this assay, and inadequate number of viral copies (<250 copies / mL). A negative result must be combined with clinical observations, patient history, and epidemiological information.  Fact Sheet for Patients:    https://www.patel.info/  Fact Sheet for Healthcare Providers: https://hall.com/  This test is not yet approved or  cleared by the [place] FDA and has been authorized for detection and/or diagnosis of SARS-CoV-2 by FDA under an Emergency Use Authorization (EUA).  This EUA will remain in effect (meaning this test can be used) for the duration of the COVID-19 declaration under Section 564(b)(1) of the Act, 21 U.S.C. section 360bbb-3(b)(1), unless the authorization is terminated or revoked sooner.  Performed at [facility], [place] [address], [place], [place] [address]      Labs: Basic Metabolic Panel: Recent Labs  Lab 03/24/22 0905 03/27/22 0551  NA 145 134*  K 4.0 3.9  CL 109 99  CO2 26 27  GLUCOSE 117* 121*  BUN 16 25*  CREATININE 0.57 0.50  CALCIUM 9.4 9.2  MG  --  1.7   Liver Function Tests: No results for input(s): "AST", "ALT", "ALKPHOS", "BILITOT", "PROT", "ALBUMIN" in the last 168 hours. CBC: Recent Labs  Lab 03/24/22 0905 03/27/22 0551  WBC 14.6* 7.1  NEUTROABS 11.4* 5.7  HGB 9.9* 10.4*  HCT 32.6* 35.6*  MCV 89.6 94.4  PLT 502* 545*   CBG: No results for input(s): "GLUCAP" in the last 168 hours. Hgb A1c No results for input(s): "HGBA1C" in the last 72 hours. Lipid Profile No results for input(s): "CHOL", "HDL", "LDLCALC", "TRIG", "CHOLHDL", "LDLDIRECT" in the last 72 hours. Thyroid function studies No results for input(s): "TSH", "  T4TOTAL", "T3FREE", "THYROIDAB" in the last 72 hours.  Invalid input(s): "FREET3" Urinalysis    Component Value Date/Time   COLORURINE STRAW (A) 03/11/2022 [place] 03/11/2022 1743   LABSPEC 1.006 03/11/2022 1743   PHURINE 8.0 03/11/2022 1743   GLUCOSEU NEGATIVE 03/11/2022 1743   HGBUR NEGATIVE 03/11/2022 1743   BILIRUBINUR NEGATIVE 03/11/2022 1743   KETONESUR NEGATIVE 03/11/2022 1743   PROTEINUR NEGATIVE 03/11/2022 1743    UROBILINOGEN 0.2 11/06/2009 1559   NITRITE NEGATIVE 03/11/2022 1743   LEUKOCYTESUR NEGATIVE 03/11/2022 1743    FURTHER DISCHARGE INSTRUCTIONS:   Get Medicines reviewed and adjusted: Please take all your medications with you for your next visit with your Primary MD   Laboratory/radiological data: Please request your Primary MD to go over all hospital tests and procedure/radiological results at the follow up, please ask your Primary MD to get all Hospital records sent to his/her office.   In some cases, they will be blood work, cultures and biopsy results pending at the time of your discharge. Please request that your primary care M.D. goes through all the records of your hospital data and follows up on these results.   Also Note the following: If you experience worsening of your admission symptoms, develop shortness of breath, life threatening emergency, suicidal or homicidal thoughts you must seek medical attention immediately by calling 911 or calling your MD immediately  if symptoms less severe.   You must read complete instructions/literature along with all the possible adverse reactions/side effects for all the Medicines you take and that have been prescribed to you. Take any new Medicines after you have completely understood and accpet all the possible adverse reactions/side effects.    Do not drive when taking Pain medications or sleeping medications (Benzodaizepines)   Do not take more than prescribed Pain, Sleep and Anxiety Medications. It is not advisable to combine anxiety,sleep and pain medications without talking with your primary care practitioner   Special Instructions: If you have smoked or chewed Tobacco  in the last 2 yrs please stop smoking, stop any regular Alcohol  and or any Recreational drug use.   Wear Seat belts while driving.   Please note: You were cared for by a hospitalis35t during your hospital stay. Once you are discharged, your primary care physician will  handle any further medical issues. Please note that NO REFILLS for any discharge medications will be authorized once you are discharged, as it is imperative that you return to your primary care physician (or establish a relationship with a primary care physician if you do not have one) for your post hospital discharge needs so that they can reassess your need for medications and monitor your lab values.  Time coordinating discharge: 35 minutes  SIGNED:  Marzetta Board, MD, PhD 03/28/2022, 9:26 AM

## 2022-03-29 ENCOUNTER — Inpatient Hospital Stay: Payer: 59 | Admitting: Primary Care

## 2022-03-29 ENCOUNTER — Other Ambulatory Visit: Payer: Self-pay

## 2022-03-29 ENCOUNTER — Emergency Department (HOSPITAL_COMMUNITY): Payer: 59

## 2022-03-29 ENCOUNTER — Emergency Department (HOSPITAL_COMMUNITY)
Admission: EM | Admit: 2022-03-29 | Discharge: 2022-03-29 | Disposition: A | Discharge status: Home / Self Care | Payer: 59 | Attending: Emergency Medicine | Admitting: Emergency Medicine

## 2022-03-29 ENCOUNTER — Encounter (HOSPITAL_COMMUNITY): Payer: Self-pay

## 2022-03-29 DIAGNOSIS — Z85118 Personal history of other malignant neoplasm of bronchus and lung: Secondary | ICD-10-CM | POA: Diagnosis not present

## 2022-03-29 DIAGNOSIS — J449 Chronic obstructive pulmonary disease, unspecified: Secondary | ICD-10-CM | POA: Diagnosis not present

## 2022-03-29 DIAGNOSIS — Z515 Encounter for palliative care: Secondary | ICD-10-CM | POA: Diagnosis not present

## 2022-03-29 DIAGNOSIS — Z7952 Long term (current) use of systemic steroids: Secondary | ICD-10-CM | POA: Diagnosis not present

## 2022-03-29 DIAGNOSIS — R519 Headache, unspecified: Secondary | ICD-10-CM | POA: Diagnosis not present

## 2022-03-29 DIAGNOSIS — R0789 Other chest pain: Secondary | ICD-10-CM | POA: Insufficient documentation

## 2022-03-29 DIAGNOSIS — R059 Cough, unspecified: Secondary | ICD-10-CM | POA: Diagnosis not present

## 2022-03-29 DIAGNOSIS — I2510 Atherosclerotic heart disease of native coronary artery without angina pectoris: Secondary | ICD-10-CM | POA: Diagnosis not present

## 2022-03-29 DIAGNOSIS — Z7189 Other specified counseling: Secondary | ICD-10-CM

## 2022-03-29 DIAGNOSIS — R079 Chest pain, unspecified: Secondary | ICD-10-CM

## 2022-03-29 LAB — CBC WITH DIFFERENTIAL/PLATELET
Abs Immature Granulocytes: 0.08 10*3/uL — ABNORMAL HIGH (ref 0.00–0.07)
Basophils Absolute: 0 10*3/uL (ref 0.0–0.1)
Basophils Relative: 0 %
Eosinophils Absolute: 0 10*3/uL (ref 0.0–0.5)
Eosinophils Relative: 0 %
HCT: 35.2 % — ABNORMAL LOW (ref 36.0–46.0)
Hemoglobin: 10.9 g/dL — ABNORMAL LOW (ref 12.0–15.0)
Immature Granulocytes: 1 %
Lymphocytes Relative: 28 %
Lymphs Abs: 4.4 10*3/uL — ABNORMAL HIGH (ref 0.7–4.0)
MCH: 27 pg (ref 26.0–34.0)
MCHC: 31 g/dL (ref 30.0–36.0)
MCV: 87.1 fL (ref 80.0–100.0)
Monocytes Absolute: 1.3 10*3/uL — ABNORMAL HIGH (ref 0.1–1.0)
Monocytes Relative: 8 %
Neutro Abs: 10 10*3/uL — ABNORMAL HIGH (ref 1.7–7.7)
Neutrophils Relative %: 63 %
Platelets: 633 10*3/uL — ABNORMAL HIGH (ref 150–400)
RBC: 4.04 MIL/uL (ref 3.87–5.11)
RDW: 18.4 % — ABNORMAL HIGH (ref 11.5–15.5)
WBC: 15.8 10*3/uL — ABNORMAL HIGH (ref 4.0–10.5)
nRBC: 0 % (ref 0.0–0.2)

## 2022-03-29 LAB — BASIC METABOLIC PANEL
Anion gap: 9 (ref 5–15)
BUN: 28 mg/dL — ABNORMAL HIGH (ref 8–23)
CO2: 30 mmol/L (ref 22–32)
Calcium: 9.6 mg/dL (ref 8.9–10.3)
Chloride: 102 mmol/L (ref 98–111)
Creatinine, Ser: 0.54 mg/dL (ref 0.44–1.00)
GFR, Estimated: >60 mL/min (ref >60)
Glucose, Bld: 83 mg/dL (ref 70–99)
Potassium: 3.4 mmol/L — ABNORMAL LOW (ref 3.5–5.1)
Sodium: 141 mmol/L (ref 135–145)

## 2022-03-29 LAB — TROPONIN I (HIGH SENSITIVITY)
Troponin I (High Sensitivity): 8 ng/L (ref <18)
Troponin I (High Sensitivity): 8 ng/L (ref <18)

## 2022-03-29 MED ORDER — CYCLOBENZAPRINE HCL 10 MG PO TABS
10.0000 mg | ORAL_TABLET | Freq: Two times a day (BID) | ORAL | 0 refills | Status: DC | PRN
Start: 2022-03-29 — End: 2023-01-08

## 2022-03-29 NOTE — ED Notes (Signed)
Pallative Care in to see pt

## 2022-03-29 NOTE — Consult Note (Signed)
Consultation Note Date: 03/29/2022   Patient Name: Tracy Park  DOB: 03-05-1956  MRN: [id number]  Age / Sex: 66 y.o., female  PCP: Irving Shows, PA-C Referring Physician: No att. providers found  Reason for Consultation: Establishing goals of care  HPI/Patient Profile: 66 y.o. female  with past medical history of  CAD, anginal pain MI, anxiety, depression, bipolar, COPD, and lung cancer currently in remission admitted on 03/29/2022 with chest pain. Patient just discharged yesterday after being admitted for COPD exacerbation. During ED evaluation patient found to have negative troponins and EKG with no signs of ischemia. PMT consulted per patient request.   Clinical Assessment and Goals of Care: I have reviewed medical records including EPIC notes, labs and imaging, received report from RN, assessed the patient and then met with patient and her daughter Tracy Park via phone  to discuss diagnosis prognosis, GOC, EOL wishes, disposition and options.  I introduced Palliative Medicine as specialized medical care for people living with serious illness. It focuses on providing relief from the symptoms and stress of a serious illness. The goal is to improve quality of life for both the patient and the family.  We review patient's recent admission.  She tells me she went home but could not sleep and quickly returned to the ER due to chest pain.  Now she attributes her chest pain to anxiety.  She tells me about a complicated grief she has been dealing with since the loss of multiple family members.  Specifically she speaks of her husband that passed away 2 years ago.  We discussed resources for her in the community.  She is going to reach out to her primary care provider.  She is also quite involved with her church and plans to reach out to her pastor for support.  We discussed finding a counselor which she intends to do.   We discussed patient's current  illness and what it means in the larger context of patient's on-going co-morbidities.   I attempted to elicit values and goals of care important to the patient.  She would like to establish extra support as she continues to process her complicated grief.  Discussed with patient the importance of continued conversation with family and the medical providers regarding overall plan of care and treatment options, ensuring decisions are within the context of the patient's values and GOCs.    Palliative Care services outpatient were explained and offered.  Patient agreeable to outpatient palliative support and referral given to liaison.  Patient does share she would want her daughter Tracy Park to make medical decisions for her if she were ever unable -contact information will be added to chart.  Questions and concerns were addressed. The family was encouraged to call with questions or concerns.    Primary Decision Maker PATIENT    SUMMARY OF RECOMMENDATIONS   -referred to outpatient palliative - discussed options for support in community - referral for spiritual care - patient plans to follow up with PCP for support  Code Status/Advance Care Planning: Full code      Primary Diagnoses: Present on Admission: **None**   I have reviewed the medical record, interviewed the patient and family, and examined the patient. The following aspects are pertinent.  Past Medical History:  Diagnosis Date   Anginal pain ([place])    Anxiety    Bipolar disorder ([place])    Cancer ([place])    COPD (chronic obstructive pulmonary disease) ([place])    Dyspnea    Family history of  adverse reaction to anesthesia    History of kidney stones    Hydroureteronephrosis 08/16/2021   Hypothyroidism    Lung cancer ([facility])    Myocardial infarction ([place])    PTSD (post-traumatic stress disorder)    Sleep apnea    Thyroid disease    Social History   Socioeconomic History   Marital status: Divorced    Spouse name: Not on  file   Number of children: Not on file   Years of education: Not on file   Highest education level: Not on file  Occupational History   Not on file  Tobacco Use   Smoking status: Every Day    Packs/day: 0.15    Types: Cigarettes   Smokeless tobacco: Never  Vaping Use   Vaping Use: Never used  Substance and Sexual Activity   Alcohol use: Not Currently   Drug use: Not Currently   Sexual activity: Not on file  Other Topics Concern   Not on file  Social History Narrative   Not on file   Social Determinants of Health   Financial Resource Strain: Not on file  Food Insecurity: Not on file  Transportation Needs: Not on file  Physical Activity: Not on file  Stress: Not on file  Social Connections: Not on file   Family History  Family history unknown: Yes   Scheduled Meds: Continuous Infusions: PRN Meds:. Allergies  Allergen Reactions   Red Dye Hives and Itching   Oxycontin [Oxycodone Hcl] Other (See Comments)    Hallucinations    Prednisone Hives and Nausea And Vomiting   Aspirin Hives   Tape Rash    Prefers paper tape   Wound Dressing Adhesive Rash   Review of Systems  Constitutional:  Positive for activity change, appetite change and unexpected weight change.  Psychiatric/Behavioral:  The patient is nervous/anxious.     Physical Exam Constitutional:      General: She is not in acute distress.    Appearance: She is ill-appearing.  Pulmonary:     Effort: Pulmonary effort is normal.  Skin:    General: Skin is warm and dry.  Neurological:     Mental Status: She is alert and oriented to person, place, and time.     Vital Signs: BP (!) 154/109   Pulse 92   Temp 97.9 F (36.6 C) (Oral)   Resp 17   Ht _0  (1.6 m)   Wt 47 kg   SpO2 94%   BMI 18.35 kg/m  Pain Scale: 0-10   Pain Score: 0-No pain   SpO2: SpO2: 94 % O2 Device:SpO2: 94 % O2 Flow Rate: .   IO: Intake/output summary: No intake or output data in the 24 hours ending 03/29/22 1043  LBM:    Baseline Weight: Weight: 47 kg Most recent weight: Weight: 47 kg     Palliative Assessment/Data:PPS 80%     *Please note that this is a verbal dictation therefore any spelling or grammatical errors are due to the "[place] [employer]" system interpretation.  Juel Burrow, DNP, AGNP-C Palliative Medicine Team [phone] Pager: [phone]

## 2022-03-29 NOTE — Discharge Instructions (Addendum)
Please follow up with your primary care provider.

## 2022-03-29 NOTE — ED Triage Notes (Signed)
BIBA from home for left sided CP with radiation to left arm, nausea, headache, and SHOB. Pain described as dull sensation. Productive cough with thick white sputum.  2lpm [place] baseline at home.

## 2022-03-29 NOTE — ED Provider Notes (Signed)
Patient care taken at shift handoff from Deno Etienne, PA-C.   In short, 66 year old female patient presenting to the emergency department complaining of left-sided chest pain which began this morning around 1 AM.  Described as pressure-like sensation left side of chest without radiation.  Patient also denies pleuritic chest pain, shortness of breath.  Patient endorses having similar symptoms in the past.  Patient was recently discharged yesterday after an admission for COPD exacerbation and was placed on steroids and bronchodilators as well as antibiotics.  Discharged home with 3 additional days of antibiotics and steroids.  Physical Exam  BP (!) 137/107   Pulse 84   Temp 98 F (36.7 C)   Resp 14   Ht 5\' 3"  (1.6 m)   Wt 47 kg   SpO2 94%   BMI 18.35 kg/m   Physical Exam Vitals and nursing note reviewed.  Constitutional:      General: She is not in acute distress.    Appearance: She is normal weight.  HENT:     Head: Normocephalic.  Cardiovascular:     Rate and Rhythm: Normal rate.  Pulmonary:     Effort: Pulmonary effort is normal.  Musculoskeletal:        General: Normal range of motion.  Neurological:     Mental Status: She is alert.     Procedures  Procedures  ED Course / MDM    Medical Decision Making Amount and/or Complexity of Data Reviewed Labs: ordered. Radiology: ordered.  Risk Prescription drug management.   Initial and second troponin both stable at 8.  As stated in previous providers note, very low suspicion for ACS with atypical history, EKG without signs of ischemia, and negative troponins.  Patient denying shortness of breath, pleuritic chest pain, leg pain.  I have very low clinical suspicion for PE.  Patient requests social work and palliative consult prior to discharge.  Consults placed.  Plan for discharge once consults complete.  Patient will follow-up with her primary care provider as needed       Ronny Bacon 03/29/22  1021    Lajean Saver, MD 03/29/22 1037

## 2022-03-29 NOTE — Progress Notes (Signed)
[facility] Hospital Liaison Note  Notified by TOC manager of patient/family request for [facility] palliative services at home after discharge.   [facility] hospital liaison will follow patient for discharge disposition.   Please call with any hospice or outpatient palliative care related questions.   Thank you for the opportunity to participate in this patient's care.   Shanita Wicker, LCSW [facility] Hospital Liaison [phone]  

## 2022-03-29 NOTE — ED Provider Notes (Signed)
[facility] DEPT Provider Note   CSN: [id number] Arrival date & time: 03/29/22  0425     History  Chief Complaint  Patient presents with   Chest Pain    Tracy Park is a 66 y.o. female.  HPI  Medical history including CAD, anginal pain MI, anxiety, depression, bipolar, COPD, lung cancer currently in remission presents with complaints of left-sided chest pain.  Patient's chest pain started this morning, around 1 PM, states she feels pressure-like sensation left side of her chest, does not radiate, denies pleuritic chest pain shortness of breath, pain is constant, associated headache, feels pressure in the front her head.  Patient states that she has had this in the past, states she typically gets chest pain and has a headache, states she also has a productive cough, white phlegm.   Reviewed patient's chart was recently discharged yesterday, she was admitted for COPD exacerbation, placed on steroids, bronchodilators as well as antibiotics, patient improved, and was discharged home with 3 additional days of antibiotics and steroids.  Home Medications Prior to Admission medications   Medication Sig Start Date End Date Taking? Authorizing Provider  levothyroxine (SYNTHROID) 137 MCG tablet Take 137 mcg by mouth daily before breakfast. 03/14/20  Yes [provider]  acetaminophen (TYLENOL) 325 MG tablet Take 2 tablets (650 mg total) by mouth every 6 (six) hours as needed for mild pain (or Fever >/= 101). 03/16/22   Nita Sells, MD  busPIRone (BUSPAR) 5 MG tablet Take 5 mg by mouth 3 (three) times daily.    [provider]  doxycycline (VIBRAMYCIN) 100 MG capsule Take 1 capsule (100 mg total) by mouth 2 (two) times daily for 3 days. 03/28/22 03/31/22  Caren Griffins, MD  famotidine (PEPCID) 40 MG tablet Take 40 mg by mouth daily. 02/14/20   [provider]  guaiFENesin (MUCINEX) 600 MG 12 hr tablet Take 1 tablet (600 mg total)  by mouth 2 (two) times daily. 03/16/22   Nita Sells, MD  HYDROcodone-acetaminophen (NORCO/VICODIN) 5-325 MG tablet Take 1 tablet by mouth every 4 (four) hours as needed for pain. 03/18/22   [provider]  ipratropium-albuterol (DUONEB) 0.5-2.5 (3) MG/3ML SOLN Take 3 mLs by nebulization every 6 (six) hours as needed (shortness of breath). 02/10/22   [provider]  lidocaine (LIDODERM) 5 % Place 1 patch onto the skin daily. Remove & Discard patch within 12 hours or as directed by MD Patient not taking: Reported on 03/13/2022 03/11/22   Lacretia Leigh, MD  Magnesium Oxide 400 MG CAPS Take 1 capsule (400 mg total) by mouth daily at 12 noon. 03/01/22   Florencia Reasons, MD  meloxicam (MOBIC) 15 MG tablet Take 15 mg by mouth daily as needed for pain. 04/07/20   [provider]  methocarbamol (ROBAXIN) 500 MG tablet Take 500 mg by mouth 2 (two) times daily as needed for muscle spasms. 02/10/20   [provider]  Multiple Vitamins-Minerals (CENTRUM WOMEN) TABS Take 1 tablet by mouth daily.    [provider]  nicotine (NICODERM CQ - DOSED IN MG/24 HOURS) 14 mg/24hr patch Place 1 patch (14 mg total) onto the skin daily. 03/28/22 03/28/23  Caren Griffins, MD  nitroGLYCERIN (NITROSTAT) 0.4 MG SL tablet Place 0.4 mg under the tongue every 5 (five) minutes as needed for chest pain.    [provider]  nystatin (MYCOSTATIN) 100000 UNIT/ML suspension Take 5 mLs (500,000 Units total) by mouth 4 (four) times daily. 03/28/22  Caren Griffins, MD  omeprazole (PRILOSEC) 20 MG capsule Take 20 mg by mouth See admin instructions. 20 mg once daily. May take an additional 20 mg PRN heartburn    [provider]  oxybutynin (DITROPAN) 5 MG tablet Take 5 mg by mouth 3 (three) times daily. 03/15/20   [provider]  OXYGEN Inhale 2 L into the lungs daily as needed (for low oxygen).    [provider]  predniSONE (DELTASONE) 10 MG tablet Take 3 tablets (30  mg total) by mouth daily for 2 days, THEN 2 tablets (20 mg total) daily for 2 days, THEN 1 tablet (10 mg total) daily for 2 days. 03/28/22 04/03/22  Caren Griffins, MD  Propylene Glycol (SYSTANE COMPLETE OP) Place 1 drop into both eyes 2 (two) times daily as needed (dry eyes).    [provider]  QUEtiapine (SEROQUEL) 300 MG tablet Take 300 mg by mouth at bedtime.    [provider]  rosuvastatin (CRESTOR) 20 MG tablet Take 20 mg by mouth daily. 01/15/20   [provider]  TRELEGY ELLIPTA 100-62.5-25 MCG/ACT AEPB Take 1 puff by mouth daily. 02/06/22   [provider]      Allergies    Red dye, Oxycontin [oxycodone hcl], Prednisone, Aspirin, Tape, and Wound dressing adhesive    Review of Systems   Review of Systems  Constitutional:  Negative for chills and fever.  Respiratory:  Negative for shortness of breath.   Cardiovascular:  Positive for chest pain.  Gastrointestinal:  Negative for abdominal pain.  Neurological:  Negative for headaches.    Physical Exam Updated Vital Signs BP (!) 137/107   Pulse 84   Temp 98 F (36.7 C)   Resp 14   Ht 5\' 3"  (1.6 m)   Wt 47 kg   SpO2 94%   BMI 18.35 kg/m  Physical Exam Vitals and nursing note reviewed.  Constitutional:      General: She is not in acute distress.    Appearance: She is not ill-appearing.  HENT:     Head: Normocephalic and atraumatic.     Nose: No congestion.  Eyes:     Conjunctiva/sclera: Conjunctivae normal.  Cardiovascular:     Rate and Rhythm: Normal rate and regular rhythm.     Pulses: Normal pulses.     Heart sounds: No murmur heard.    No friction rub. No gallop.  Pulmonary:     Effort: No respiratory distress.     Breath sounds: No wheezing, rhonchi or rales.     Comments: No evidence of respiratory distress nontachypneic nonhypoxic speaking full sentences, slight expiratory wheezing heard bilaterally without rales or rhonchi present..  Patient's chest pain was focalized  reproducible on the left anterior aspect chest on ribs 2 and 3. Abdominal:     Palpations: Abdomen is soft.     Tenderness: There is no right CVA tenderness, left CVA tenderness or guarding.  Musculoskeletal:     Right lower leg: No edema.     Left lower leg: No edema.  Skin:    General: Skin is warm and dry.  Neurological:     Mental Status: She is alert.  Psychiatric:        Mood and Affect: Mood normal.     ED Results / Procedures / Treatments   Labs (all labs ordered are listed, but only abnormal results are displayed) Labs Reviewed  CBC WITH DIFFERENTIAL/PLATELET - Abnormal; Notable for the following components:  Result Value   WBC 15.8 (*)    Hemoglobin 10.9 (*)    HCT 35.2 (*)    RDW 18.4 (*)    Platelets 633 (*)    Neutro Abs 10.0 (*)    Lymphs Abs 4.4 (*)    Monocytes Absolute 1.3 (*)    Abs Immature Granulocytes 0.08 (*)    All other components within normal limits  BASIC METABOLIC PANEL - Abnormal; Notable for the following components:   Potassium 3.4 (*)    BUN 28 (*)    All other components within normal limits  TROPONIN I (HIGH SENSITIVITY)  TROPONIN I (HIGH SENSITIVITY)    EKG None  Radiology DG Chest 2 View  Result Date: 03/29/2022 CLINICAL DATA:  Left-sided chest pain. EXAM: CHEST - 2 VIEW COMPARISON:  03/24/2022 FINDINGS: Stable volume loss left hemithorax with post treatment scarring in the left apex, similar to prior. Right lung is hyperexpanded but clear. No pleural effusion. The cardiopericardial silhouette is within normal limits for size. The visualized bony structures of the thorax are unremarkable. Telemetry leads overlie the chest. IMPRESSION: Stable exam. No acute cardiopulmonary findings. Electronically Signed   By: Misty Stanley M.D.   On: 03/29/2022 06:35    Procedures Procedures    Medications Ordered in ED Medications - No data to display  ED Course/ Medical Decision Making/ A&P                           Medical Decision  Making Amount and/or Complexity of Data Reviewed Labs: ordered. Radiology: ordered.   This patient presents to the ED for concern of chest pain, this involves an extensive number of treatment options, and is a complaint that carries with it a high risk of complications and morbidity.  The differential diagnosis includes PE, ACS, pneumonia    Additional history obtained:  Additional history obtained from N/A External records from outside source obtained and reviewed including previous hospitalization   Co morbidities that complicate the patient evaluation  COPD, anxiety, CAD  Social Determinants of Health:  N/A    Lab Tests:  I Ordered, and personally interpreted labs.  The pertinent results include: CBC shows leukocytosis normocytic anemia hemoglobin 10.9, first troponin is 8, BNP 2 potassium 3.4 BUN of 28   Imaging Studies ordered:  I ordered imaging studies including chest x-ray I independently visualized and interpreted imaging which showed negative acute findings I agree with the radiologist interpretation   Cardiac Monitoring:  The patient was maintained on a cardiac monitor.  I personally viewed and interpreted the cardiac monitored which showed an underlying rhythm of: Without signs of ischemia   Medicines ordered and prescription drug management:  I ordered medication including N/A I have reviewed the patients home medicines and have made adjustments as needed  Critical Interventions:  N/A   Reevaluation:  Presents with chest pain, will obtain lab work imaging and reassess.    Consultations Obtained:  N/A    Test Considered:  N/A    Rule out I have low suspicion for ACS as history is atypical, EKG was sinus rhythm without signs of ischemia, first troponin negative, second troponin pending at this time.  Low suspicion for PE as patient denies pleuritic chest pain, shortness of breath, patient denies leg pain, no pedal edema noted on exam,  vital signs reassuring nontachypneic nonhypoxic.  low suspicion for AAA or aortic dissection as history is atypical, patient has low risk factors.  Low  suspicion for systemic infection as patient is nontoxic-appearing, vital signs reassuring, no obvious source infection noted on exam.     Dispostion and problem list  Due to shift change patient be handed off to Barbie Banner, PA-C  Follow-up on second [facility] if unremarkable can be discharged home follow-up with PCP as needed.             Final Clinical Impression(s) / ED Diagnoses Final diagnoses:  None    Rx / DC Orders ED Discharge Orders     None         Marcello Fennel, PA-C 03/29/22 [place], MD 03/29/22 [phone]

## 2022-03-30 ENCOUNTER — Emergency Department (HOSPITAL_COMMUNITY): Payer: 59

## 2022-03-30 ENCOUNTER — Emergency Department (HOSPITAL_COMMUNITY)
Admission: EM | Admit: 2022-03-30 | Discharge: 2022-03-30 | Disposition: A | Discharge status: Home / Self Care | Payer: 59 | Attending: Emergency Medicine | Admitting: Emergency Medicine

## 2022-03-30 ENCOUNTER — Encounter (HOSPITAL_COMMUNITY): Payer: Self-pay

## 2022-03-30 ENCOUNTER — Other Ambulatory Visit: Payer: Self-pay

## 2022-03-30 DIAGNOSIS — Z20822 Contact with and (suspected) exposure to covid-19: Secondary | ICD-10-CM | POA: Insufficient documentation

## 2022-03-30 DIAGNOSIS — R0602 Shortness of breath: Secondary | ICD-10-CM | POA: Insufficient documentation

## 2022-03-30 DIAGNOSIS — F410 Panic disorder [episodic paroxysmal anxiety] without agoraphobia: Secondary | ICD-10-CM | POA: Diagnosis not present

## 2022-03-30 DIAGNOSIS — J449 Chronic obstructive pulmonary disease, unspecified: Secondary | ICD-10-CM | POA: Diagnosis not present

## 2022-03-30 DIAGNOSIS — Z85118 Personal history of other malignant neoplasm of bronchus and lung: Secondary | ICD-10-CM | POA: Insufficient documentation

## 2022-03-30 DIAGNOSIS — R000 Tachycardia, unspecified: Secondary | ICD-10-CM | POA: Diagnosis not present

## 2022-03-30 DIAGNOSIS — R079 Chest pain, unspecified: Secondary | ICD-10-CM | POA: Diagnosis not present

## 2022-03-30 DIAGNOSIS — I2510 Atherosclerotic heart disease of native coronary artery without angina pectoris: Secondary | ICD-10-CM | POA: Diagnosis not present

## 2022-03-30 LAB — COMPREHENSIVE METABOLIC PANEL
ALT: 19 U/L (ref 0–44)
AST: 14 U/L — ABNORMAL LOW (ref 15–41)
Albumin: 3.4 g/dL — ABNORMAL LOW (ref 3.5–5.0)
Alkaline Phosphatase: 75 U/L (ref 38–126)
Anion gap: 9 (ref 5–15)
BUN: 19 mg/dL (ref 8–23)
CO2: 28 mmol/L (ref 22–32)
Calcium: 9.3 mg/dL (ref 8.9–10.3)
Chloride: 100 mmol/L (ref 98–111)
Creatinine, Ser: 0.47 mg/dL (ref 0.44–1.00)
GFR, Estimated: >60 mL/min (ref >60)
Glucose, Bld: 126 mg/dL — ABNORMAL HIGH (ref 70–99)
Potassium: 3.1 mmol/L — ABNORMAL LOW (ref 3.5–5.1)
Sodium: 137 mmol/L (ref 135–145)
Total Bilirubin: 0.7 mg/dL (ref 0.3–1.2)
Total Protein: 7.4 g/dL (ref 6.5–8.1)

## 2022-03-30 LAB — CBC WITH DIFFERENTIAL/PLATELET
Abs Immature Granulocytes: 0.11 10*3/uL — ABNORMAL HIGH (ref 0.00–0.07)
Basophils Absolute: 0 10*3/uL (ref 0.0–0.1)
Basophils Relative: 0 %
Eosinophils Absolute: 0.1 10*3/uL (ref 0.0–0.5)
Eosinophils Relative: 0 %
HCT: 39.3 % (ref 36.0–46.0)
Hemoglobin: 12.5 g/dL (ref 12.0–15.0)
Immature Granulocytes: 1 %
Lymphocytes Relative: 12 %
Lymphs Abs: 2.7 10*3/uL (ref 0.7–4.0)
MCH: 27.4 pg (ref 26.0–34.0)
MCHC: 31.8 g/dL (ref 30.0–36.0)
MCV: 86.2 fL (ref 80.0–100.0)
Monocytes Absolute: 1.5 10*3/uL — ABNORMAL HIGH (ref 0.1–1.0)
Monocytes Relative: 7 %
Neutro Abs: 17.8 10*3/uL — ABNORMAL HIGH (ref 1.7–7.7)
Neutrophils Relative %: 80 %
Platelets: 570 10*3/uL — ABNORMAL HIGH (ref 150–400)
RBC: 4.56 MIL/uL (ref 3.87–5.11)
RDW: 17.9 % — ABNORMAL HIGH (ref 11.5–15.5)
WBC: 22.2 10*3/uL — ABNORMAL HIGH (ref 4.0–10.5)
nRBC: 0 % (ref 0.0–0.2)

## 2022-03-30 LAB — LIPASE, BLOOD: Lipase: 22 U/L (ref 11–51)

## 2022-03-30 LAB — TROPONIN I (HIGH SENSITIVITY)
Troponin I (High Sensitivity): 6 ng/L (ref <18)
Troponin I (High Sensitivity): 8 ng/L (ref <18)

## 2022-03-30 LAB — SARS CORONAVIRUS 2 BY RT PCR: SARS Coronavirus 2 by RT PCR: NEGATIVE

## 2022-03-30 LAB — D-DIMER, QUANTITATIVE: D-Dimer, Quant: 1.53 ug/mL-FEU — ABNORMAL HIGH (ref 0.00–0.50)

## 2022-03-30 LAB — LACTIC ACID, PLASMA: Lactic Acid, Venous: 0.8 mmol/L (ref 0.5–1.9)

## 2022-03-30 MED ORDER — LACTATED RINGERS IV BOLUS
500.0000 mL | Freq: Once | INTRAVENOUS | Status: AC
  Administered 2022-03-30: 500 mL via INTRAVENOUS

## 2022-03-30 MED ORDER — FENTANYL CITRATE PF 50 MCG/ML IJ SOSY: 25.0000 ug | PREFILLED_SYRINGE | Freq: Once | INTRAMUSCULAR | Status: DC

## 2022-03-30 MED ORDER — LORAZEPAM 2 MG/ML IJ SOLN
0.5000 mg | Freq: Once | INTRAMUSCULAR | Status: AC
Start: 2022-03-30 — End: 2022-03-30
  Administered 2022-03-30: 0.5 mg via INTRAVENOUS
  Filled 2022-03-30: qty 1

## 2022-03-30 MED ORDER — IOHEXOL 350 MG/ML SOLN
100.0000 mL | Freq: Once | INTRAVENOUS | Status: AC | PRN
  Administered 2022-03-30: 100 mL via INTRAVENOUS

## 2022-03-30 NOTE — ED Provider Notes (Signed)
[facility] DEPT Provider Note   CSN: [id number] Arrival date & time: 03/30/22  1310     History  Chief Complaint  Patient presents with   Shortness of Breath   Chest Pain   Panic Attack    Tracy Park is a 66 y.o. female.  HPI 66 year old female history of COPD, CAD, MI, anxiety, bipolar disorder, lung cancer, presents today complaining of shortness of breath, chest pain, and concern for anxiety attack.  She states that she has been having pain in her bilateral rib cage pointing to her flank area bilaterally.  Patient was discharged 2 days ago after admission for COPD exacerbation.  Patient did not mention this on initial history.  Noted she did mention that she had been here yesterday.  Did state that yesterday was her birthday and she was at her daughter's house.  She states she came directly from her daughter's house due to this pain.  She reports that she has not been having cough until she was here in the emergency department.  At this time, she is having some productive cough.  She states she had fever to 101 last night.     Home Medications Prior to Admission medications   Medication Sig Start Date End Date Taking? Authorizing Provider  acetaminophen (TYLENOL) 325 MG tablet Take 2 tablets (650 mg total) by mouth every 6 (six) hours as needed for mild pain (or Fever >/= 101). 03/16/22  Yes Samtani, Jai-Gurmukh, MD  busPIRone (BUSPAR) 5 MG tablet Take 5 mg by mouth 3 (three) times daily.   Yes [provider]  cyclobenzaprine (FLEXERIL) 10 MG tablet Take 1 tablet (10 mg total) by mouth 2 (two) times daily as needed for muscle spasms. 03/29/22  Yes Marcello Fennel, PA-C  doxycycline (VIBRAMYCIN) 100 MG capsule Take 1 capsule (100 mg total) by mouth 2 (two) times daily for 3 days. 03/28/22 03/31/22 Yes Gherghe, Vella Redhead, MD  famotidine (PEPCID) 40 MG tablet Take 40 mg by mouth daily. 02/14/20  Yes [provider]  guaiFENesin  (MUCINEX) 600 MG 12 hr tablet Take 1 tablet (600 mg total) by mouth 2 (two) times daily. 03/16/22  Yes Nita Sells, MD  HYDROcodone-acetaminophen (NORCO/VICODIN) 5-325 MG tablet Take 1 tablet by mouth every 4 (four) hours as needed for pain. 03/18/22  Yes [provider]  levothyroxine (SYNTHROID) 137 MCG tablet Take 137 mcg by mouth daily before breakfast. 03/14/20  Yes [provider]  Magnesium Oxide 400 MG CAPS Take 1 capsule (400 mg total) by mouth daily at 12 noon. 03/01/22  Yes Florencia Reasons, MD  meloxicam (MOBIC) 15 MG tablet Take 15 mg by mouth daily as needed for pain. 04/07/20  Yes [provider]  methocarbamol (ROBAXIN) 500 MG tablet Take 500 mg by mouth 2 (two) times daily as needed for muscle spasms. 02/10/20  Yes [provider]  Multiple Vitamins-Minerals (CENTRUM WOMEN) TABS Take 1 tablet by mouth daily.   Yes [provider]  nystatin (MYCOSTATIN) 100000 UNIT/ML suspension Take 5 mLs (500,000 Units total) by mouth 4 (four) times daily. 03/28/22  Yes Gherghe, Vella Redhead, MD  omeprazole (PRILOSEC) 20 MG capsule Take 20 mg by mouth See admin instructions. 20 mg once daily. May take an additional 20 mg PRN heartburn   Yes [provider]  oxybutynin (DITROPAN) 5 MG tablet Take 5 mg by mouth 3 (three) times daily. 03/15/20  Yes [provider]  QUEtiapine (SEROQUEL) 300 MG tablet Take 300  mg by mouth at bedtime.   Yes [provider]  rosuvastatin (CRESTOR) 20 MG tablet Take 20 mg by mouth daily. 01/15/20  Yes [provider]  tamsulosin (FLOMAX) 0.4 MG CAPS capsule Take 0.4 mg by mouth daily. 03/27/22  Yes [provider]  TRELEGY ELLIPTA 100-62.5-25 MCG/ACT AEPB Take 1 puff by mouth daily. 02/06/22  Yes [provider]  ipratropium-albuterol (DUONEB) 0.5-2.5 (3) MG/3ML SOLN Take 3 mLs by nebulization every 6 (six) hours as needed (shortness of breath). 02/10/22   [provider]  lidocaine  (LIDODERM) 5 % Place 1 patch onto the skin daily. Remove & Discard patch within 12 hours or as directed by MD Patient not taking: Reported on 03/13/2022 03/11/22   Lacretia Leigh, MD  nicotine (NICODERM CQ - DOSED IN MG/24 HOURS) 14 mg/24hr patch Place 1 patch (14 mg total) onto the skin daily. Patient not taking: Reported on 03/30/2022 03/28/22 03/28/23  Caren Griffins, MD  nitroGLYCERIN (NITROSTAT) 0.4 MG SL tablet Place 0.4 mg under the tongue every 5 (five) minutes as needed for chest pain.    [provider]  OXYGEN Inhale 2 L into the lungs daily as needed (for low oxygen).    [provider]  predniSONE (DELTASONE) 10 MG tablet Take 3 tablets (30 mg total) by mouth daily for 2 days, THEN 2 tablets (20 mg total) daily for 2 days, THEN 1 tablet (10 mg total) daily for 2 days. Patient not taking: Reported on 03/30/2022 03/28/22 04/03/22  Caren Griffins, MD  Propylene Glycol (SYSTANE COMPLETE OP) Place 1 drop into both eyes 2 (two) times daily as needed (dry eyes).    [provider]      Allergies    Red dye, Oxycontin [oxycodone hcl], Prednisone, Aspirin, Tape, and Wound dressing adhesive    Review of Systems   Review of Systems  Physical Exam Updated Vital Signs BP (!) 144/115   Pulse 94   Temp 98.9 F (37.2 C) (Oral)   Resp 20   Ht 1.6 m (5\' 3" )   Wt 46.7 kg   SpO2 100%   BMI 18.25 kg/m  Physical Exam Vitals and nursing note reviewed.  Constitutional:      General: She is not in acute distress.    Appearance: She is well-developed.  HENT:     Head: Normocephalic.     Mouth/Throat:     Mouth: Mucous membranes are moist.  Eyes:     Pupils: Pupils are equal, round, and reactive to light.  Cardiovascular:     Rate and Rhythm: Regular rhythm. Tachycardia present.  Pulmonary:     Effort: Tachypnea present.     Breath sounds: Examination of the right-lower field reveals rales. Examination of the left-lower field reveals rales. Rales present.   Musculoskeletal:        General: Normal range of motion.     Cervical back: Normal range of motion.  Skin:    General: Skin is warm.     Capillary Refill: Capillary refill takes less than 2 seconds.  Neurological:     General: No focal deficit present.     Mental Status: She is alert.  Psychiatric:        Mood and Affect: Mood normal.     ED Results / Procedures / Treatments   Labs (all labs ordered are listed, but only abnormal results are displayed) Labs Reviewed  CBC WITH DIFFERENTIAL/PLATELET - Abnormal; Notable for the following components:  Result Value   WBC 22.2 (*)    RDW 17.9 (*)    Platelets 570 (*)    Neutro Abs 17.8 (*)    Monocytes Absolute 1.5 (*)    Abs Immature Granulocytes 0.11 (*)    All other components within normal limits  COMPREHENSIVE METABOLIC PANEL - Abnormal; Notable for the following components:   Potassium 3.1 (*)    Glucose, Bld 126 (*)    Albumin 3.4 (*)    AST 14 (*)    All other components within normal limits  D-DIMER, QUANTITATIVE - Abnormal; Notable for the following components:   D-Dimer, Quant 1.53 (*)    All other components within normal limits  SARS CORONAVIRUS 2 BY RT PCR  LIPASE, BLOOD  LACTIC ACID, PLASMA  URINALYSIS, ROUTINE W REFLEX MICROSCOPIC  TROPONIN I (HIGH SENSITIVITY)  TROPONIN I (HIGH SENSITIVITY)    EKG EKG Interpretation  Date/Time:  Saturday March 30 2022 13:17:59 EDT Ventricular Rate:  124 PR Interval:  110 QRS Duration: 106 QT Interval:  331 QTC Calculation: 476 R Axis:   -73 Text Interpretation: Sinus tachycardia Multiple premature complexes, vent & supraven Consider right atrial enlargement Left anterior fascicular block Consider right ventricular hypertrophy Left ventricular hypertrophy Borderline ST elevation, lateral leads Since last tracing rate faster Otherwise no significant change Confirmed by Daleen Bo [phone]) on 03/30/2022 3:20:51 PM  Radiology CT Angio Chest/Abd/Pel for Dissection  W and/or Wo Contrast  Result Date: 03/30/2022 CLINICAL DATA:  Shortness of breath, chest pain EXAM: CT ANGIOGRAPHY CHEST, ABDOMEN AND PELVIS TECHNIQUE: Non-contrast CT of the chest was initially obtained. Multidetector CT imaging through the chest, abdomen and pelvis was performed using the standard protocol during bolus administration of intravenous contrast. Multiplanar reconstructed images and MIPs were obtained and reviewed to evaluate the vascular anatomy. RADIATION DOSE REDUCTION: This exam was performed according to the departmental dose-optimization program which includes automated exposure control, adjustment of the mA and/or kV according to patient size and/or use of iterative reconstruction technique. CONTRAST:  114mL OMNIPAQUE IOHEXOL 350 MG/ML SOLN COMPARISON:  03/20/2022 FINDINGS: CTA CHEST FINDINGS Cardiovascular: There is no demonstrable intramural hematoma in thoracic aorta in the noncontrast images. There is homogeneous enhancement in thoracic aorta. There is no demonstrable intimal flap. There are no intraluminal filling defects in central pulmonary artery branches. Mediastinum/Nodes: No significant lymphadenopathy is seen. Lungs/Pleura: There is decreased volume along with patchy infiltrates in left upper lung field. There is ectasia of bronchi in the left upper lung field. There are multiple blebs and bullae in the apices, more so on the left side. In image 33 of series 5, there is 5 mm pleural-based density in posterior right upper lobe. Small patchy infiltrate is noted immediately inferior to this pleural-based nodule suggesting scarring or intracerebral pneumonia. These findings have not changed significantly. Increased interstitial markings are seen in left lower lobe with no significant interval change. There is no pleural effusion or pneumothorax. Musculoskeletal: No acute findings are seen. Review of the MIP images confirms the above findings. CTA ABDOMEN AND PELVIS FINDINGS VASCULAR  Aorta: There are scattered atherosclerotic plaques and calcifications. There is no evidence of dissection more focal aneurysmal dilation. Celiac: Patent with no significant stenosis. SMA: Patent with no significant stenosis. Renals: There are small calcifications in the proximal course without significant stenosis IMA: Patent. Inflow: Atherosclerotic plaques and calcifications are seen in common iliac arteries. Veins: Unremarkable. Review of the MIP images confirms the above findings. NON-VASCULAR Hepatobiliary: No focal abnormalities are seen in the  liver. Liver measures 21.1 cm in length. There is no dilation of bile ducts. There is no wall thickening in gallbladder. Pancreas: There is prominence of pancreatic duct. No focal abnormalities are seen. Spleen: Unremarkable. Adrenals/Urinary Tract: Adrenals are unremarkable. There are small calcifications in the midportion of right kidney, possibly calcification and renal artery branches or tiny nonobstructing stones. Urinary bladder is unremarkable. Stomach/Bowel: Stomach is unremarkable. Small bowel loops are not dilated. The appendix is not seen. There is no significant wall thickening and colon. There is no pericolic stranding. Lymphatic: Unremarkable. Reproductive: Uterus is not seen. Other: There is no ascites or pneumoperitoneum. Musculoskeletal: Last lumbar vertebra is transitional. There is anterolisthesis at the L4-L5 level There is previous surgical fusion in lower cervical spine. Review of the MIP images confirms the above findings. IMPRESSION: There is no evidence of dissection or focal aneurysmal dilation in thoracic and abdominal aorta. Major branches of thoracic and abdominal aorta appear patent. There is no evidence of central pulmonary artery embolism. Severe COPD. Decreased volume along with patchy infiltrates in left upper lobe has not changed. Findings suggest atelectasis/pneumonia and possibly underlying scarring. Subtle increase in interstitial  markings in left lower lobe may suggest scarring or interstitial pneumonia. There is a 5 mm pleural-based density in right upper lobe with no significant interval change. No follow-up needed if patient is low-risk.This recommendation follows the consensus statement: Guidelines for Management of Incidental Pulmonary Nodules Detected on CT Images: From the Fleischner Society 2017; Radiology 2017; 284:228-243. Enlarged liver. There is no evidence of intestinal obstruction or pneumoperitoneum. There is no hydronephrosis. Other findings as described in the body of the report. Electronically Signed   By: Elmer Picker M.D.   On: 03/30/2022 16:35   DG Chest Port 1 View  Result Date: 03/30/2022 CLINICAL DATA:  Chest pain with cough and shortness of breath several days. Smoker. History of lung cancer. EXAM: PORTABLE CHEST 1 VIEW COMPARISON:  03/29/2022 FINDINGS: Stable post left upper lobectomy changes. Subtle hazy density over the left base which may be due to atelectasis versus early infection. Right lung is clear. Cardiac silhouette is within normal. Remainder of the exam is unchanged. IMPRESSION: 1. Subtle hazy density left base which may be due to atelectasis versus early infection. 2. Post left upper lobectomy changes. Electronically Signed   By: Marin Olp M.D.   On: 03/30/2022 14:43   DG Chest 2 View  Result Date: 03/29/2022 CLINICAL DATA:  Left-sided chest pain. EXAM: CHEST - 2 VIEW COMPARISON:  03/24/2022 FINDINGS: Stable volume loss left hemithorax with post treatment scarring in the left apex, similar to prior. Right lung is hyperexpanded but clear. No pleural effusion. The cardiopericardial silhouette is within normal limits for size. The visualized bony structures of the thorax are unremarkable. Telemetry leads overlie the chest. IMPRESSION: Stable exam. No acute cardiopulmonary findings. Electronically Signed   By: Misty Stanley M.D.   On: 03/29/2022 06:35    Procedures Procedures     Medications Ordered in ED Medications  lactated ringers bolus 500 mL (0 mLs Intravenous Stopped 03/30/22 1621)  LORazepam (ATIVAN) injection 0.5 mg (0.5 mg Intravenous Given 03/30/22 1420)  iohexol (OMNIPAQUE) 350 MG/ML injection 100 mL (100 mLs Intravenous Contrast Given 03/30/22 1600)    ED Course/ Medical Decision Making/ A&P                           Medical Decision Making 66 year old female history of COPD presents today complaining of  bilateral flank pain worsening with deep breathing.  Additionally she has cough productive of sputum with a change in color from baseline.  Patient is completing doxycycline. Differential diagnosis includes acute coronary syndrome, COPD exacerbation, PE, pneumothorax, there are diseases of the lung that could cause pleuritic pain Upper abdominal etiologies such as pancreatitis, gastritis, and diseases of the great vessels such as dissection Patient is being evaluated here with labs, CT, EKG was obtained Leukocytosis noted at 22,000, lactic acid obtained and is not elevated Compared to first prior was 18,000 this is somewhat upward trending however, patient is not febrile, patient had CT scan obtained that shows stable infiltrate versus chronic changes and has appeared stable from a respiratory standpoint.  She is on outpatient antibiotics and is to continue taking No evidence of acute cardiac ischemia without acute ST changes on EKG and normal troponins As noted previously CT was obtained and there is no evidence of PE, patient does have mildly elevated D-dimer  Stable appearing infiltrate on CT without any evidence of PE or other acute abnormalities Patient remains hemodynamically stable.  She states she is on her doxycycline.  States she is allergic to prednisone.  Patient discharged in stable condition.  She is advised regarding need for follow-up and return precautions.  She voices understanding regarding this.  Amount and/or Complexity of Data  Reviewed Labs: ordered. Radiology: ordered.  Risk Prescription drug management.          Final Clinical Impression(s) / ED Diagnoses Final diagnoses:  Shortness of breath    Rx / DC Orders ED Discharge Orders     None         Pattricia Boss, MD 03/30/22 2304

## 2022-03-30 NOTE — ED Triage Notes (Signed)
PT c/o chest pain and sob for the past several days. Pt was seen yesterday morning for the same. Reports she has been under a lot of stress and having anxiety attacks.

## 2022-03-30 NOTE — Discharge Instructions (Addendum)
You were evaluated here today for shortness of breath. There is no evidence of worsening pneumonia on your CT scan and no evidence of pulmonary embolism. Please continue your antibiotics and your inhalers Please continue your oxygen at home Follow-up with your primary care next week

## 2022-04-01 ENCOUNTER — Telehealth: Payer: Self-pay

## 2022-04-01 ENCOUNTER — Emergency Department (HOSPITAL_COMMUNITY): Payer: 59

## 2022-04-01 ENCOUNTER — Emergency Department (HOSPITAL_COMMUNITY)
Admission: EM | Admit: 2022-04-01 | Discharge: 2022-04-01 | Disposition: A | Discharge status: Home / Self Care | Payer: 59 | Attending: Emergency Medicine | Admitting: Emergency Medicine

## 2022-04-01 ENCOUNTER — Other Ambulatory Visit: Payer: Self-pay

## 2022-04-01 ENCOUNTER — Encounter (HOSPITAL_COMMUNITY): Payer: Self-pay | Admitting: Emergency Medicine

## 2022-04-01 DIAGNOSIS — Z5321 Procedure and treatment not carried out due to patient leaving prior to being seen by health care provider: Secondary | ICD-10-CM | POA: Insufficient documentation

## 2022-04-01 DIAGNOSIS — Z85118 Personal history of other malignant neoplasm of bronchus and lung: Secondary | ICD-10-CM | POA: Diagnosis not present

## 2022-04-01 DIAGNOSIS — M549 Dorsalgia, unspecified: Secondary | ICD-10-CM | POA: Insufficient documentation

## 2022-04-01 LAB — CBC WITH DIFFERENTIAL/PLATELET
Abs Immature Granulocytes: 0.04 10*3/uL (ref 0.00–0.07)
Basophils Absolute: 0 10*3/uL (ref 0.0–0.1)
Basophils Relative: 0 %
Eosinophils Absolute: 0.1 10*3/uL (ref 0.0–0.5)
Eosinophils Relative: 1 %
HCT: 34.4 % — ABNORMAL LOW (ref 36.0–46.0)
Hemoglobin: 10.7 g/dL — ABNORMAL LOW (ref 12.0–15.0)
Immature Granulocytes: 0 %
Lymphocytes Relative: 27 %
Lymphs Abs: 3.6 10*3/uL (ref 0.7–4.0)
MCH: 27.4 pg (ref 26.0–34.0)
MCHC: 31.1 g/dL (ref 30.0–36.0)
MCV: 88 fL (ref 80.0–100.0)
Monocytes Absolute: 0.9 10*3/uL (ref 0.1–1.0)
Monocytes Relative: 7 %
Neutro Abs: 8.5 10*3/uL — ABNORMAL HIGH (ref 1.7–7.7)
Neutrophils Relative %: 65 %
Platelets: 461 10*3/uL — ABNORMAL HIGH (ref 150–400)
RBC: 3.91 MIL/uL (ref 3.87–5.11)
RDW: 17.7 % — ABNORMAL HIGH (ref 11.5–15.5)
WBC: 13.1 10*3/uL — ABNORMAL HIGH (ref 4.0–10.5)
nRBC: 0 % (ref 0.0–0.2)

## 2022-04-01 LAB — BASIC METABOLIC PANEL
Anion gap: 7 (ref 5–15)
BUN: 20 mg/dL (ref 8–23)
CO2: 28 mmol/L (ref 22–32)
Calcium: 9 mg/dL (ref 8.9–10.3)
Chloride: 106 mmol/L (ref 98–111)
Creatinine, Ser: 0.42 mg/dL — ABNORMAL LOW (ref 0.44–1.00)
GFR, Estimated: >60 mL/min (ref >60)
Glucose, Bld: 99 mg/dL (ref 70–99)
Potassium: 3.2 mmol/L — ABNORMAL LOW (ref 3.5–5.1)
Sodium: 141 mmol/L (ref 135–145)

## 2022-04-01 LAB — TROPONIN I (HIGH SENSITIVITY): Troponin I (High Sensitivity): 5 ng/L (ref <18)

## 2022-04-01 NOTE — ED Provider Triage Note (Signed)
Emergency Medicine Provider Triage Evaluation Note  Tracy Park , a 66 y.o. female  was evaluated in triage.  Patient has previous history of lung cancer and reports currently being in admission.  Pt complains of continued left sided lung pain.  Patient has been seen here multiple times over the last month, as recently as 711, 712, 716, 721 722 for ongoing left-sided chest/lung pain and shortness of breath.  She states that her pain is continuous and severe.  She called her PCP who told her that if she continues to have pain to come to the emergency department.  Patient states that she may have black mold in her house and moving to [place] soon.  Review of Systems  Positive:  Negative:   Physical Exam  BP (!) 125/94   Pulse 92   Temp 98.1 F (36.7 C) (Oral)   Resp 16   SpO2 100%  Gen:   Awake, no distress   Resp:  Normal effort,  mild expiratory wheezing bilaterally MSK:   Moves extremities without difficulty  Other:   Medical Decision Making  Medically screening exam initiated at 8:46 PM.  Appropriate orders placed.  Tracy Park was informed that the remainder of the evaluation will be completed by another provider, this initial triage assessment does not replace that evaluation, and the importance of remaining in the ED until their evaluation is complete.     Tonye Pearson, [place] 04/01/22 2048

## 2022-04-01 NOTE — ED Triage Notes (Signed)
Patient complains of left flank pain for a few days. MD told her to come if she continued to have pain.   HX: stage 3 lung cancer (left side)  EMS vitals: 100% SPO2 on 3 L nasal canula (2L chronic) 142/68 BP 98 HR 20 RR

## 2022-04-01 NOTE — ED Notes (Signed)
Pt states that she is not in pain anymore and has a doctor appt scheduled for tomorrow. Pt states that she is leaving.

## 2022-04-01 NOTE — Telephone Encounter (Signed)
Spoke with patient regarding Palliative Care services. She states she will be moving to [place] within the next couple weeks. Referral canceled and referring provider notified.

## 2023-01-07 ENCOUNTER — Inpatient Hospital Stay (HOSPITAL_COMMUNITY)
Admission: EM | Admit: 2023-01-07 | Discharge: 2023-01-13 | DRG: 190 | Disposition: A | Discharge status: Home / Self Care | Payer: 59 | Attending: Internal Medicine | Admitting: Internal Medicine

## 2023-01-07 ENCOUNTER — Encounter (HOSPITAL_COMMUNITY): Payer: Self-pay | Admitting: Emergency Medicine

## 2023-01-07 ENCOUNTER — Other Ambulatory Visit: Payer: Self-pay

## 2023-01-07 ENCOUNTER — Emergency Department (HOSPITAL_COMMUNITY): Payer: 59

## 2023-01-07 DIAGNOSIS — I10 Essential (primary) hypertension: Secondary | ICD-10-CM | POA: Diagnosis present

## 2023-01-07 DIAGNOSIS — Z85118 Personal history of other malignant neoplasm of bronchus and lung: Secondary | ICD-10-CM

## 2023-01-07 DIAGNOSIS — Z90710 Acquired absence of both cervix and uterus: Secondary | ICD-10-CM

## 2023-01-07 DIAGNOSIS — Z9981 Dependence on supplemental oxygen: Secondary | ICD-10-CM

## 2023-01-07 DIAGNOSIS — G4733 Obstructive sleep apnea (adult) (pediatric): Secondary | ICD-10-CM | POA: Diagnosis present

## 2023-01-07 DIAGNOSIS — Z79899 Other long term (current) drug therapy: Secondary | ICD-10-CM

## 2023-01-07 DIAGNOSIS — Z79890 Hormone replacement therapy (postmenopausal): Secondary | ICD-10-CM

## 2023-01-07 DIAGNOSIS — F17210 Nicotine dependence, cigarettes, uncomplicated: Secondary | ICD-10-CM | POA: Diagnosis present

## 2023-01-07 DIAGNOSIS — F319 Bipolar disorder, unspecified: Secondary | ICD-10-CM | POA: Diagnosis present

## 2023-01-07 DIAGNOSIS — F4310 Post-traumatic stress disorder, unspecified: Secondary | ICD-10-CM | POA: Diagnosis present

## 2023-01-07 DIAGNOSIS — C3492 Malignant neoplasm of unspecified part of left bronchus or lung: Secondary | ICD-10-CM

## 2023-01-07 DIAGNOSIS — E039 Hypothyroidism, unspecified: Secondary | ICD-10-CM | POA: Diagnosis present

## 2023-01-07 DIAGNOSIS — Z66 Do not resuscitate: Secondary | ICD-10-CM | POA: Diagnosis present

## 2023-01-07 DIAGNOSIS — Z1152 Encounter for screening for COVID-19: Secondary | ICD-10-CM

## 2023-01-07 DIAGNOSIS — Z681 Body mass index (BMI) 19 or less, adult: Secondary | ICD-10-CM

## 2023-01-07 DIAGNOSIS — J9611 Chronic respiratory failure with hypoxia: Secondary | ICD-10-CM

## 2023-01-07 DIAGNOSIS — E46 Unspecified protein-calorie malnutrition: Secondary | ICD-10-CM | POA: Diagnosis present

## 2023-01-07 DIAGNOSIS — J441 Chronic obstructive pulmonary disease with (acute) exacerbation: Principal | ICD-10-CM | POA: Diagnosis present

## 2023-01-07 DIAGNOSIS — I252 Old myocardial infarction: Secondary | ICD-10-CM

## 2023-01-07 DIAGNOSIS — Z886 Allergy status to analgesic agent status: Secondary | ICD-10-CM

## 2023-01-07 DIAGNOSIS — Z87442 Personal history of urinary calculi: Secondary | ICD-10-CM

## 2023-01-07 DIAGNOSIS — Z8249 Family history of ischemic heart disease and other diseases of the circulatory system: Secondary | ICD-10-CM

## 2023-01-07 DIAGNOSIS — G4730 Sleep apnea, unspecified: Secondary | ICD-10-CM | POA: Diagnosis present

## 2023-01-07 DIAGNOSIS — J9621 Acute and chronic respiratory failure with hypoxia: Secondary | ICD-10-CM | POA: Diagnosis present

## 2023-01-07 DIAGNOSIS — R911 Solitary pulmonary nodule: Secondary | ICD-10-CM | POA: Diagnosis present

## 2023-01-07 DIAGNOSIS — Z902 Acquired absence of lung [part of]: Secondary | ICD-10-CM

## 2023-01-07 LAB — CBC WITH DIFFERENTIAL/PLATELET
Abs Immature Granulocytes: 0.15 10*3/uL — ABNORMAL HIGH (ref 0.00–0.07)
Basophils Absolute: 0.1 10*3/uL (ref 0.0–0.1)
Basophils Relative: 1 %
Eosinophils Absolute: 0.1 10*3/uL (ref 0.0–0.5)
Eosinophils Relative: 1 %
HCT: 38.6 % (ref 36.0–46.0)
Hemoglobin: 11.8 g/dL — ABNORMAL LOW (ref 12.0–15.0)
Immature Granulocytes: 2 %
Lymphocytes Relative: 30 %
Lymphs Abs: 3 10*3/uL (ref 0.7–4.0)
MCH: 28.6 pg (ref 26.0–34.0)
MCHC: 30.6 g/dL (ref 30.0–36.0)
MCV: 93.7 fL (ref 80.0–100.0)
Monocytes Absolute: 0.7 10*3/uL (ref 0.1–1.0)
Monocytes Relative: 7 %
Neutro Abs: 6.1 10*3/uL (ref 1.7–7.7)
Neutrophils Relative %: 59 %
Platelets: 383 10*3/uL (ref 150–400)
RBC: 4.12 MIL/uL (ref 3.87–5.11)
RDW: 14.9 % (ref 11.5–15.5)
WBC: 10.1 10*3/uL (ref 4.0–10.5)
nRBC: 0 % (ref 0.0–0.2)

## 2023-01-07 LAB — BLOOD GAS, VENOUS
Acid-Base Excess: 12.5 mmol/L — ABNORMAL HIGH (ref 0.0–2.0)
Bicarbonate: 40.3 mmol/L — ABNORMAL HIGH (ref 20.0–28.0)
O2 Saturation: 56.8 %
Patient temperature: 37
pCO2, Ven: 65 mmHg — ABNORMAL HIGH (ref 44–60)
pH, Ven: 7.4 (ref 7.25–7.43)
pO2, Ven: <31 mmHg — CL (ref 32–45)

## 2023-01-07 LAB — LACTIC ACID, PLASMA: Lactic Acid, Venous: 0.8 mmol/L (ref 0.5–1.9)

## 2023-01-07 LAB — TROPONIN I (HIGH SENSITIVITY): Troponin I (High Sensitivity): 8 ng/L (ref <18)

## 2023-01-07 MED ORDER — ALBUTEROL SULFATE (2.5 MG/3ML) 0.083% IN NEBU
10.0000 mg/h | INHALATION_SOLUTION | Freq: Once | RESPIRATORY_TRACT | Status: AC
  Administered 2023-01-07: 10 mg/h via RESPIRATORY_TRACT
  Filled 2023-01-07: qty 12

## 2023-01-07 NOTE — ED Triage Notes (Signed)
Patient presents from home due to shortness of breath and a cough in which she is coughing up yellow phlegm. Symptoms started today and have gotten worse as the day progresses. She tried to use her inhaler with no symptom relief. She describes a "heavy" felling in the left lung. A week ago she was diagnosis with pneumonia, but began to feel better until today. EMS noted rhonchi and diminished breath sounds. She admits she does not feel hydrated. EMS administered 3 duonebs and 125 of solumedrol.     HX lung cancer  EMS vitals: 110/55 BP 110 HR 100% SPO2 on 6L Neb 20 RR 181 CBG

## 2023-01-07 NOTE — ED Provider Notes (Incomplete)
[place] EMERGENCY DEPARTMENT AT [facility] Provider Note   CSN: [id number] Arrival date & time: 01/07/23  2148     History {Add pertinent medical, surgical, social history, OB history to HPI:1} Chief Complaint  Patient presents with  . Shortness of Breath  . Cough    Tracy Park is a 67 y.o. female with PMHx of COPD and lung cancer in remission presents to ED with SOB and cough. Symptoms began yesterday AM and have progressed in severity since. Endorsing yellow productive cough and left sided chest pain that radiates towards center of chest. EMS administered 3 duonebs and 125 of solumedrol. Patient is on 2L O2 satting around 86-90% at baseline. Denies fevers, nausea, vomiting, diarrhea, dizziness, confusion, syncope.   Shortness of Breath Associated symptoms: cough   Cough Associated symptoms: shortness of breath        Home Medications Prior to Admission medications   Medication Sig Start Date End Date Taking? Authorizing Provider  acetaminophen (TYLENOL) 325 MG tablet Take 2 tablets (650 mg total) by mouth every 6 (six) hours as needed for mild pain (or Fever >/= 101). 03/16/22   Rhetta Mura, MD  busPIRone (BUSPAR) 5 MG tablet Take 5 mg by mouth 3 (three) times daily.    [provider]  cyclobenzaprine (FLEXERIL) 10 MG tablet Take 1 tablet (10 mg total) by mouth 2 (two) times daily as needed for muscle spasms. 03/29/22   Carroll Sage, PA-C  famotidine (PEPCID) 40 MG tablet Take 40 mg by mouth daily. 02/14/20   [provider]  guaiFENesin (MUCINEX) 600 MG 12 hr tablet Take 1 tablet (600 mg total) by mouth 2 (two) times daily. 03/16/22   Rhetta Mura, MD  HYDROcodone-acetaminophen (NORCO/VICODIN) 5-325 MG tablet Take 1 tablet by mouth every 4 (four) hours as needed for pain. 03/18/22   [provider]  ipratropium-albuterol (DUONEB) 0.5-2.5 (3) MG/3ML SOLN Take 3 mLs by nebulization every 6 (six) hours as needed  (shortness of breath). 02/10/22   [provider]  levothyroxine (SYNTHROID) 137 MCG tablet Take 137 mcg by mouth daily before breakfast. 03/14/20   [provider]  lidocaine (LIDODERM) 5 % Place 1 patch onto the skin daily. Remove & Discard patch within 12 hours or as directed by MD Patient not taking: Reported on 03/13/2022 03/11/22   Lorre Nick, MD  Magnesium Oxide 400 MG CAPS Take 1 capsule (400 mg total) by mouth daily at 12 noon. 03/01/22   Albertine Grates, MD  meloxicam (MOBIC) 15 MG tablet Take 15 mg by mouth daily as needed for pain. 04/07/20   [provider]  methocarbamol (ROBAXIN) 500 MG tablet Take 500 mg by mouth 2 (two) times daily as needed for muscle spasms. 02/10/20   [provider]  Multiple Vitamins-Minerals (CENTRUM WOMEN) TABS Take 1 tablet by mouth daily.    [provider]  nicotine (NICODERM CQ - DOSED IN MG/24 HOURS) 14 mg/24hr patch Place 1 patch (14 mg total) onto the skin daily. Patient not taking: Reported on 03/30/2022 03/28/22 03/28/23  Leatha Gilding, MD  nitroGLYCERIN (NITROSTAT) 0.4 MG SL tablet Place 0.4 mg under the tongue every 5 (five) minutes as needed for chest pain.    [provider]  nystatin (MYCOSTATIN) 100000 UNIT/ML suspension Take 5 mLs (500,000 Units total) by mouth 4 (four) times daily. 03/28/22   Leatha Gilding, MD  omeprazole (PRILOSEC) 20 MG capsule Take 20 mg by mouth See admin instructions. 20 mg once  daily. May take an additional 20 mg PRN heartburn    [provider]  oxybutynin (DITROPAN) 5 MG tablet Take 5 mg by mouth 3 (three) times daily. 03/15/20   [provider]  OXYGEN Inhale 2 L into the lungs daily as needed (for low oxygen).    [provider]  Propylene Glycol (SYSTANE COMPLETE OP) Place 1 drop into both eyes 2 (two) times daily as needed (dry eyes).    [provider]  QUEtiapine (SEROQUEL) 300 MG tablet Take 300 mg by mouth at bedtime.    [provider]  rosuvastatin (CRESTOR) 20 MG tablet Take 20 mg by mouth daily. 01/15/20   [provider]  tamsulosin (FLOMAX) 0.4 MG CAPS capsule Take 0.4 mg by mouth daily. 03/27/22   [provider]  TRELEGY ELLIPTA 100-62.5-25 MCG/ACT AEPB Take 1 puff by mouth daily. 02/06/22   [provider]      Allergies    Red dye, Oxycontin [oxycodone hcl], Prednisone, Aspirin, Tape, and Wound dressing adhesive    Review of Systems   Review of Systems  Respiratory:  Positive for cough and shortness of breath.     Physical Exam Updated Vital Signs BP 112/69 (BP Location: Right Arm) Comment: Simultaneous filing. User may not have seen previous data.  Pulse (!) 110 Comment: Simultaneous filing. User may not have seen previous data.  Temp 98 F (36.7 C) (Oral)   Resp (!) 33 Comment: Simultaneous filing. User may not have seen previous data.  SpO2 100% Comment: Simultaneous filing. User may not have seen previous data. Physical Exam Vitals and nursing note reviewed.  Constitutional:      General: She is not in acute distress.    Appearance: She is not toxic-appearing.  HENT:     Head: Normocephalic and atraumatic.     Mouth/Throat:     Mouth: Mucous membranes are moist.     Pharynx: No posterior oropharyngeal erythema.  Eyes:     General: No scleral icterus.       Right eye: No discharge.        Left eye: No discharge.     Conjunctiva/sclera: Conjunctivae normal.  Cardiovascular:     Rate and Rhythm: Tachycardia present.     Pulses: Normal pulses.     Heart sounds: No murmur heard. Pulmonary:     Effort: Pulmonary effort is normal. Tachypnea present. No respiratory distress.     Breath sounds: Examination of the left-upper field reveals wheezing. Examination of the left-middle field reveals wheezing. Examination of the right-lower field reveals decreased breath sounds. Examination of the left-lower field reveals wheezing. Decreased breath sounds and wheezing  present. No rhonchi or rales.  Abdominal:     General: Bowel sounds are normal.     Palpations: Abdomen is soft.     Tenderness: There is no abdominal tenderness.  Musculoskeletal:     Right lower leg: No edema.     Left lower leg: No edema.  Skin:    General: Skin is warm and dry.     Findings: No rash.  Neurological:     General: No focal deficit present.     Mental Status: She is alert. Mental status is at baseline.  Psychiatric:        Mood and Affect: Mood normal.     ED Results / Procedures / Treatments   Labs (all labs ordered are listed, but only abnormal results are displayed) Labs Reviewed  BLOOD GAS, VENOUS  EKG None  Radiology No results found.  Procedures .Critical Care  Performed by: Dorthy Cooler, PA-C Authorized by: Dorthy Cooler, PA-C   Critical care provider statement:    Critical care time (minutes):  30   Critical care was necessary to treat or prevent imminent or life-threatening deterioration of the following conditions:  Respiratory failure   Critical care was time spent personally by me on the following activities:  Development of treatment plan with patient or surrogate, discussions with consultants, evaluation of patient's response to treatment, examination of patient, ordering and review of laboratory studies, ordering and review of radiographic studies, ordering and performing treatments and interventions, pulse oximetry, re-evaluation of patient's condition and review of old charts   {Document cardiac monitor, telemetry assessment procedure when appropriate:1}  Medications Ordered in ED Medications - No data to display  ED Course/ Medical Decision Making/ A&P   {   Click here for ABCD2, HEART and other calculatorsREFRESH Note before signing :1}                          Medical Decision Making Amount and/or Complexity of Data Reviewed Labs: ordered. Radiology: ordered.  Risk Prescription drug management.   This  patient presents to the ED for concern of shortness of breath, this involves an extensive number of treatment options, and is a complaint that carries with it a high risk of complications and morbidity.  The differential diagnosis includes Anxiety, Anaphylaxis/Angioedema, Aspirated FB, Arrhythmia, CHF, Asthma, COPD, PNA, COVID/Flu/RSV, STEMI, Tamponade, TPNX, DKA, Sepsis, Toxin   Co morbidities that complicate the patient evaluation  COPD, lung cancer   Additional history obtained:  none   Lab Tests:  I Ordered, and personally interpreted labs.  The pertinent results include:   - CMP:  - BNP: *** - Trop: within normal limits - CBC: mild anemia; no concern for leukocytosis - VBG: CO2 (65); O2 (31); Bicarb (40.3) - Respiratory Panel: negative   Imaging Studies ordered:  I ordered imaging studies including chest xray - I independently visualized and interpreted imaging  Shared results with patient I agree with the radiologist interpretation   Cardiac Monitoring: / EKG:  The patient was maintained on a cardiac monitor.  I personally viewed and interpreted the cardiac monitored which showed an underlying rhythm of: Sinus rhythm without acute ST elevation   Consultations Obtained:  I requested consultation with the ***,  and discussed lab and imaging findings as well as pertinent plan - they recommend: ***   Problem List / ED Course / Critical interventions / Medication management  Patient presents to ED complaining of cough and SOB concerning for PNA. O2 sat at 93% on 2L [place]. Physical exam with wheezing and diminished breath sounds - patient given continuous albuterol.   DDx: These are considered less likely due to history of present illness and physical exam findings Aspirated FB: no history of choking Arrhythmia/STEMI: EKG and troponin without concern CHF: no physical exam findings; BNP within normal limits TPNX: Lungs clear to auscultation bilaterally Sepsis:  afebrile and other vital signs stable  Risk Stratification Score:  Wells: <4 - low risk of PE SIRS: meets SIRS criterea   Social Determinants of Health:  none     {Document critical care time when appropriate:1} {Document review of labs and clinical decision tools ie heart score, Chads2Vasc2 etc:1}  {Document your independent review of radiology images, and any outside records:1} {Document your discussion with family members, caretakers, and  with consultants:1} {Document social determinants of health affecting pt's care:1} {Document your decision making why or why not admission, treatments were needed:1} Final Clinical Impression(s) / ED Diagnoses Final diagnoses:  None    Rx / DC Orders ED Discharge Orders     None

## 2023-01-07 NOTE — ED Provider Notes (Signed)
[place] EMERGENCY DEPARTMENT AT [facility] Provider Note   CSN: [id number] Arrival date & time: 01/07/23  2148     History  Chief Complaint  Patient presents with   Shortness of Breath   Cough    Tracy Park is a 67 y.o. female with PMHx of COPD and lung cancer in remission presents to ED with SOB and cough. Symptoms began yesterday AM and have progressed in severity since. Endorsing yellow productive cough and left sided chest pain that radiates towards center of chest. EMS administered 3 duonebs and 125 of solumedrol. Patient is on 2L O2 satting around 86-90% at baseline. Denies fevers, nausea, vomiting, diarrhea, dizziness, confusion, syncope.   Shortness of Breath Associated symptoms: cough   Cough Associated symptoms: shortness of breath        Home Medications Prior to Admission medications   Medication Sig Start Date End Date Taking? Authorizing Provider  albuterol (VENTOLIN HFA) 108 (90 Base) MCG/ACT inhaler Inhale 2 puffs into the lungs every 6 (six) hours as needed for wheezing or shortness of breath.   Yes [provider]  Budeson-Glycopyrrol-Formoterol (BREZTRI AEROSPHERE) 160-9-4.8 MCG/ACT AERO Inhale 1 puff into the lungs in the morning and at bedtime.   Yes [provider]  guaiFENesin (MUCINEX) 600 MG 12 hr tablet Take 1 tablet (600 mg total) by mouth 2 (two) times daily. 03/16/22  Yes Rhetta Mura, MD  levothyroxine (SYNTHROID) 125 MCG tablet Take 125 mcg by mouth daily before breakfast.   Yes [provider]  losartan (COZAAR) 25 MG tablet Take 12.5 mg by mouth daily.   Yes [provider]  Magnesium Oxide 400 MG CAPS Take 1 capsule (400 mg total) by mouth daily at 12 noon. 03/01/22  Yes Albertine Grates, MD  nitroGLYCERIN (NITROSTAT) 0.4 MG SL tablet Place 0.4 mg under the tongue every 5 (five) minutes as needed for chest pain.   Yes [provider]  pantoprazole (PROTONIX) 40 MG tablet Take 40 mg by  mouth daily.   Yes [provider]  rosuvastatin (CRESTOR) 20 MG tablet Take 20 mg by mouth daily. 01/15/20  Yes [provider]  tamsulosin (FLOMAX) 0.4 MG CAPS capsule Take 0.4 mg by mouth daily. 03/27/22  Yes [provider]  Vibegron (GEMTESA) 75 MG TABS Take 1 tablet by mouth daily.   Yes [provider]  OXYGEN Inhale 2 L into the lungs daily as needed (for low oxygen).    [provider]      Allergies    Red dye, Oxycontin [oxycodone hcl], Prednisone, Aspirin, Tape, and Wound dressing adhesive    Review of Systems   Review of Systems  Respiratory:  Positive for cough and shortness of breath.     Physical Exam Updated Vital Signs BP 106/62   Pulse (!) 106   Temp 98 F (36.7 C) (Oral)   Resp (!) 24   SpO2 93%  Physical Exam Vitals and nursing note reviewed.  Constitutional:      General: She is not in acute distress.    Appearance: She is not toxic-appearing.  HENT:     Head: Normocephalic and atraumatic.     Mouth/Throat:     Mouth: Mucous membranes are moist.     Pharynx: No posterior oropharyngeal erythema.  Eyes:     General: No scleral icterus.       Right eye: No discharge.        Left eye: No discharge.  Conjunctiva/sclera: Conjunctivae normal.  Cardiovascular:     Rate and Rhythm: Tachycardia present.     Pulses: Normal pulses.     Heart sounds: No murmur heard. Pulmonary:     Effort: Pulmonary effort is normal. Tachypnea present. No respiratory distress.     Breath sounds: Examination of the left-upper field reveals wheezing. Examination of the left-middle field reveals wheezing. Examination of the right-lower field reveals decreased breath sounds. Examination of the left-lower field reveals wheezing. Decreased breath sounds and wheezing present. No rhonchi or rales.  Abdominal:     General: Bowel sounds are normal.     Palpations: Abdomen is soft.     Tenderness: There is no abdominal tenderness.   Musculoskeletal:     Right lower leg: No edema.     Left lower leg: No edema.  Skin:    General: Skin is warm and dry.     Findings: No rash.  Neurological:     General: No focal deficit present.     Mental Status: She is alert. Mental status is at baseline.  Psychiatric:        Mood and Affect: Mood normal.     ED Results / Procedures / Treatments   Labs (all labs ordered are listed, but only abnormal results are displayed) Labs Reviewed  BLOOD GAS, VENOUS - Abnormal; Notable for the following components:      Result Value   pCO2, Ven 65 (*)    pO2, Ven <31 (*)    Bicarbonate 40.3 (*)    Acid-Base Excess 12.5 (*)    All other components within normal limits  CBC WITH DIFFERENTIAL/PLATELET - Abnormal; Notable for the following components:   Hemoglobin 11.8 (*)    Abs Immature Granulocytes 0.15 (*)    All other components within normal limits  COMPREHENSIVE METABOLIC PANEL - Abnormal; Notable for the following components:   Chloride 97 (*)    CO2 33 (*)    Glucose, Bld 147 (*)    Calcium 8.5 (*)    Albumin 2.9 (*)    AST 13 (*)    Total Bilirubin 0.1 (*)    All other components within normal limits  I-STAT CHEM 8, ED - Abnormal; Notable for the following components:   Chloride 97 (*)    Creatinine, Ser 0.40 (*)    Glucose, Bld 223 (*)    Calcium, Ion 1.12 (*)    TCO2 36 (*)    All other components within normal limits  RESP PANEL BY RT-PCR (RSV, FLU A&B, COVID)  RVPGX2  CULTURE, BLOOD (ROUTINE X 2)  CULTURE, BLOOD (ROUTINE X 2)  LACTIC ACID, PLASMA  LACTIC ACID, PLASMA  BRAIN NATRIURETIC PEPTIDE  CBC  BASIC METABOLIC PANEL  HIV ANTIBODY (ROUTINE TESTING W REFLEX)  TROPONIN I (HIGH SENSITIVITY)  TROPONIN I (HIGH SENSITIVITY)    EKG EKG Interpretation  Date/Time:  Tuesday January 07 2023 23:17:40 EDT Ventricular Rate:  114 PR Interval:  125 QRS Duration: 107 QT Interval:  343 QTC Calculation: 473 R Axis:   12 Text Interpretation: Sinus tachycardia  Right atrial enlargement RSR' in V1 or V2, probably normal variant Left ventricular hypertrophy Nonspecific T abnormalities, lateral leads ST elevation, consider anterior injury No significant change since last tracing Confirmed by Linwood Dibbles [phone]) on 01/07/2023 11:23:24 PM  Radiology DG Chest Port 1 View  Result Date: 01/07/2023 CLINICAL DATA:  Shortness of breath EXAM: PORTABLE CHEST 1 VIEW COMPARISON:  04/01/2022 FINDINGS: Cardiac shadow is stable. Chronic scarring is noted  in the left apex with pleural capping. No new focal infiltrate or effusion is seen. No bony abnormality is noted. IMPRESSION: Chronic scarring in the left apex.  No acute abnormality noted. Electronically Signed   By: Alcide Clever M.D.   On: 01/07/2023 23:01    Procedures .Critical Care  Performed by: Dorthy Cooler, PA-C Authorized by: Dorthy Cooler, PA-C   Critical care provider statement:    Critical care time (minutes):  30   Critical care was necessary to treat or prevent imminent or life-threatening deterioration of the following conditions:  Respiratory failure   Critical care was time spent personally by me on the following activities:  Development of treatment plan with patient or surrogate, discussions with consultants, evaluation of patient's response to treatment, examination of patient, ordering and review of laboratory studies, ordering and review of radiographic studies, ordering and performing treatments and interventions, pulse oximetry, re-evaluation of patient's condition and review of old charts     Medications Ordered in ED Medications  enoxaparin (LOVENOX) injection 40 mg (has no administration in time range)  cefTRIAXone (ROCEPHIN) 1 g in sodium chloride 0.9 % 100 mL IVPB (1 g Intravenous New Bag/Given 01/08/23 0148)  albuterol (PROVENTIL) (2.5 MG/3ML) 0.083% nebulizer solution 2.5 mg (has no administration in time range)  umeclidinium bromide (INCRUSE ELLIPTA) 62.5 MCG/ACT 1 puff (has  no administration in time range)  mometasone-formoterol (DULERA) 200-5 MCG/ACT inhaler 2 puff (2 puffs Inhalation Not Given 01/08/23 0141)  methylPREDNISolone sodium succinate (SOLU-MEDROL) 40 mg/mL injection 40 mg (has no administration in time range)  losartan (COZAAR) tablet 12.5 mg (has no administration in time range)  levothyroxine (SYNTHROID) tablet 125 mcg (has no administration in time range)  guaiFENesin (MUCINEX) 12 hr tablet 600 mg (600 mg Oral Given 01/08/23 0148)  magnesium oxide (MAG-OX) tablet 400 mg (has no administration in time range)  rosuvastatin (CRESTOR) tablet 20 mg (has no administration in time range)  pantoprazole (PROTONIX) EC tablet 40 mg (has no administration in time range)  mirabegron ER (MYRBETRIQ) tablet 25 mg (has no administration in time range)  tamsulosin (FLOMAX) capsule 0.4 mg (has no administration in time range)  albuterol (PROVENTIL) (2.5 MG/3ML) 0.083% nebulizer solution (10 mg/hr Nebulization Given 01/07/23 2302)    ED Course/ Medical Decision Making/ A&P                             Medical Decision Making Amount and/or Complexity of Data Reviewed Labs: ordered. Radiology: ordered.  Risk Prescription drug management.   This patient presents to the ED for concern of shortness of breath, this involves an extensive number of treatment options, and is a complaint that carries with it a high risk of complications and morbidity.  The differential diagnosis includes Anxiety, Anaphylaxis/Angioedema, Aspirated FB, Arrhythmia, CHF, Asthma, COPD, PNA, COVID/Flu/RSV, STEMI, Tamponade, TPNX, DKA, Sepsis, Toxin   Co morbidities that complicate the patient evaluation  COPD, lung cancer   Additional history obtained:  none   Lab Tests:  I Ordered, and personally interpreted labs.  The pertinent results include:   - CMP:  - BNP: within normal limits - LA: within normal limits - Trop: within normal limits - CBC: mild anemia; no concern for  leukocytosis - VBG: CO2 (65); O2 (31); Bicarb (40.3) - Respiratory Panel: negative - Blood cultures: pending   Imaging Studies ordered:  I ordered imaging studies including chest xray - Chronic scarring in the left apex. No acute  abnormality noted.  I independently visualized and interpreted imaging  I agree with the radiologist interpretation   Cardiac Monitoring: / EKG:  The patient was maintained on a cardiac monitor.  I personally viewed and interpreted the cardiac monitored which showed an underlying rhythm of: Sinus rhythm without acute ST elevation   Consultations Obtained:  I requested consultation with the Hospitalist,  and discussed lab and imaging findings as well as pertinent plan - they agreed to admit patient for COPD exacerbation   Problem List / ED Course / Critical interventions / Medication management  Patient presents to ED complaining of cough and SOB concerning for PNA. O2 sat at 93% on 2L [place]. Physical exam with wheezing and diminished breath sounds - patient given continuous albuterol. Chest xray without concern for PNA. Given patient's physical exam and meeting SIRS criteria, PA Browning and I believe it would be best for patient to receive inpatient care at this time. Blood work pending. Consulted hospitalist who agreed to admit patient.  DDx: These are considered less likely due to history of present illness and physical exam findings Aspirated FB: no history of choking Arrhythmia/STEMI: EKG and troponin without concern CHF: no physical exam findings; BNP within normal limits TPNX: Lungs clear to auscultation bilaterally   Risk Stratification Score:  Wells: <4 - low risk of PE SIRS: meets SIRS criterea   Social Determinants of Health:  none           Final Clinical Impression(s) / ED Diagnoses Final diagnoses:  COPD exacerbation [facility])    Rx / DC Orders ED Discharge Orders     None         Margarita Rana 01/08/23 Therisa Doyne    Linwood Dibbles, MD 01/10/23 [phone]

## 2023-01-08 DIAGNOSIS — E039 Hypothyroidism, unspecified: Secondary | ICD-10-CM | POA: Diagnosis not present

## 2023-01-08 DIAGNOSIS — Z85118 Personal history of other malignant neoplasm of bronchus and lung: Secondary | ICD-10-CM | POA: Diagnosis not present

## 2023-01-08 DIAGNOSIS — J441 Chronic obstructive pulmonary disease with (acute) exacerbation: Secondary | ICD-10-CM

## 2023-01-08 LAB — COMPREHENSIVE METABOLIC PANEL
ALT: 15 U/L (ref 0–44)
AST: 13 U/L — ABNORMAL LOW (ref 15–41)
Albumin: 2.9 g/dL — ABNORMAL LOW (ref 3.5–5.0)
Alkaline Phosphatase: 72 U/L (ref 38–126)
Anion gap: 8 (ref 5–15)
BUN: 9 mg/dL (ref 8–23)
CO2: 33 mmol/L — ABNORMAL HIGH (ref 22–32)
Calcium: 8.5 mg/dL — ABNORMAL LOW (ref 8.9–10.3)
Chloride: 97 mmol/L — ABNORMAL LOW (ref 98–111)
Creatinine, Ser: 0.63 mg/dL (ref 0.44–1.00)
GFR, Estimated: >60 mL/min (ref >60)
Glucose, Bld: 147 mg/dL — ABNORMAL HIGH (ref 70–99)
Potassium: 4 mmol/L (ref 3.5–5.1)
Sodium: 138 mmol/L (ref 135–145)
Total Bilirubin: 0.1 mg/dL — ABNORMAL LOW (ref 0.3–1.2)
Total Protein: 6.5 g/dL (ref 6.5–8.1)

## 2023-01-08 LAB — I-STAT CHEM 8, ED
BUN: 9 mg/dL (ref 8–23)
Calcium, Ion: 1.12 mmol/L — ABNORMAL LOW (ref 1.15–1.40)
Chloride: 97 mmol/L — ABNORMAL LOW (ref 98–111)
Creatinine, Ser: 0.4 mg/dL — ABNORMAL LOW (ref 0.44–1.00)
Glucose, Bld: 223 mg/dL — ABNORMAL HIGH (ref 70–99)
HCT: 36 % (ref 36.0–46.0)
Hemoglobin: 12.2 g/dL (ref 12.0–15.0)
Potassium: 3.9 mmol/L (ref 3.5–5.1)
Sodium: 138 mmol/L (ref 135–145)
TCO2: 36 mmol/L — ABNORMAL HIGH (ref 22–32)

## 2023-01-08 LAB — TROPONIN I (HIGH SENSITIVITY): Troponin I (High Sensitivity): 6 ng/L (ref <18)

## 2023-01-08 LAB — BASIC METABOLIC PANEL
Anion gap: 7 (ref 5–15)
BUN: 13 mg/dL (ref 8–23)
CO2: 33 mmol/L — ABNORMAL HIGH (ref 22–32)
Calcium: 9 mg/dL (ref 8.9–10.3)
Chloride: 98 mmol/L (ref 98–111)
Creatinine, Ser: 0.6 mg/dL (ref 0.44–1.00)
GFR, Estimated: >60 mL/min (ref >60)
Glucose, Bld: 231 mg/dL — ABNORMAL HIGH (ref 70–99)
Potassium: 4.4 mmol/L (ref 3.5–5.1)
Sodium: 138 mmol/L (ref 135–145)

## 2023-01-08 LAB — CBC
HCT: 35.2 % — ABNORMAL LOW (ref 36.0–46.0)
Hemoglobin: 10.7 g/dL — ABNORMAL LOW (ref 12.0–15.0)
MCH: 28.2 pg (ref 26.0–34.0)
MCHC: 30.4 g/dL (ref 30.0–36.0)
MCV: 92.9 fL (ref 80.0–100.0)
Platelets: 371 10*3/uL (ref 150–400)
RBC: 3.79 MIL/uL — ABNORMAL LOW (ref 3.87–5.11)
RDW: 15 % (ref 11.5–15.5)
WBC: 6.7 10*3/uL (ref 4.0–10.5)
nRBC: 0 % (ref 0.0–0.2)

## 2023-01-08 LAB — BRAIN NATRIURETIC PEPTIDE: B Natriuretic Peptide: 82.8 pg/mL (ref 0.0–100.0)

## 2023-01-08 LAB — LACTIC ACID, PLASMA: Lactic Acid, Venous: 0.9 mmol/L (ref 0.5–1.9)

## 2023-01-08 LAB — HIV ANTIBODY (ROUTINE TESTING W REFLEX): HIV Screen 4th Generation wRfx: NONREACTIVE

## 2023-01-08 LAB — RESP PANEL BY RT-PCR (RSV, FLU A&B, COVID)  RVPGX2
Influenza A by PCR: NEGATIVE
Influenza B by PCR: NEGATIVE
Resp Syncytial Virus by PCR: NEGATIVE
SARS Coronavirus 2 by RT PCR: NEGATIVE

## 2023-01-08 MED ORDER — TAMSULOSIN HCL 0.4 MG PO CAPS
0.4000 mg | ORAL_CAPSULE | Freq: Every day | ORAL | Status: DC
  Administered 2023-01-08 – 2023-01-13 (×6): 0.4 mg via ORAL
  Filled 2023-01-08 (×6): qty 1

## 2023-01-08 MED ORDER — BUDESONIDE 0.5 MG/2ML IN SUSP: 0.5000 mg | Freq: Two times a day (BID) | RESPIRATORY_TRACT | Status: DC

## 2023-01-08 MED ORDER — MOMETASONE FURO-FORMOTEROL FUM 200-5 MCG/ACT IN AERO
2.0000 | INHALATION_SPRAY | Freq: Two times a day (BID) | RESPIRATORY_TRACT | Status: DC
  Administered 2023-01-08 – 2023-01-09 (×3): 2 via RESPIRATORY_TRACT
  Filled 2023-01-08: qty 8.8

## 2023-01-08 MED ORDER — PANTOPRAZOLE SODIUM 40 MG PO TBEC
40.0000 mg | DELAYED_RELEASE_TABLET | Freq: Every day | ORAL | Status: DC
  Administered 2023-01-08 – 2023-01-13 (×6): 40 mg via ORAL
  Filled 2023-01-08 (×6): qty 1

## 2023-01-08 MED ORDER — SODIUM CHLORIDE 0.9 % IV SOLN
1.0000 g | INTRAVENOUS | Status: AC
  Administered 2023-01-08 – 2023-01-12 (×5): 1 g via INTRAVENOUS
  Filled 2023-01-08 (×5): qty 10

## 2023-01-08 MED ORDER — SODIUM CHLORIDE 3 % IN NEBU
4.0000 mL | INHALATION_SOLUTION | Freq: Every day | RESPIRATORY_TRACT | Status: AC
  Administered 2023-01-09 – 2023-01-11 (×3): 4 mL via RESPIRATORY_TRACT
  Filled 2023-01-08 (×3): qty 4

## 2023-01-08 MED ORDER — ENOXAPARIN SODIUM 40 MG/0.4ML IJ SOSY
40.0000 mg | PREFILLED_SYRINGE | INTRAMUSCULAR | Status: DC
  Administered 2023-01-08: 40 mg via SUBCUTANEOUS
  Filled 2023-01-08: qty 0.4

## 2023-01-08 MED ORDER — SODIUM CHLORIDE 3 % IN NEBU
4.0000 mL | INHALATION_SOLUTION | Freq: Every day | RESPIRATORY_TRACT | Status: DC
  Filled 2023-01-08: qty 4

## 2023-01-08 MED ORDER — LOSARTAN POTASSIUM 25 MG PO TABS
12.5000 mg | ORAL_TABLET | Freq: Every day | ORAL | Status: DC
  Administered 2023-01-08 – 2023-01-09 (×2): 12.5 mg via ORAL
  Filled 2023-01-08 (×2): qty 0.5

## 2023-01-08 MED ORDER — METHYLPREDNISOLONE SODIUM SUCC 40 MG IJ SOLR
40.0000 mg | Freq: Two times a day (BID) | INTRAMUSCULAR | Status: DC
  Administered 2023-01-08 – 2023-01-12 (×9): 40 mg via INTRAVENOUS
  Filled 2023-01-08 (×9): qty 1

## 2023-01-08 MED ORDER — UMECLIDINIUM BROMIDE 62.5 MCG/ACT IN AEPB
1.0000 | INHALATION_SPRAY | Freq: Every day | RESPIRATORY_TRACT | Status: DC
  Administered 2023-01-08 – 2023-01-10 (×3): 1 via RESPIRATORY_TRACT
  Filled 2023-01-08: qty 7

## 2023-01-08 MED ORDER — BENZONATATE 100 MG PO CAPS
200.0000 mg | ORAL_CAPSULE | Freq: Three times a day (TID) | ORAL | Status: DC | PRN
  Administered 2023-01-08: 200 mg via ORAL
  Filled 2023-01-08: qty 2

## 2023-01-08 MED ORDER — MAGNESIUM OXIDE -MG SUPPLEMENT 400 (240 MG) MG PO TABS
400.0000 mg | ORAL_TABLET | Freq: Every day | ORAL | Status: DC
  Administered 2023-01-08 – 2023-01-13 (×6): 400 mg via ORAL
  Filled 2023-01-08 (×6): qty 1

## 2023-01-08 MED ORDER — GUAIFENESIN ER 600 MG PO TB12
600.0000 mg | ORAL_TABLET | Freq: Two times a day (BID) | ORAL | Status: DC
  Administered 2023-01-08 – 2023-01-09 (×4): 600 mg via ORAL
  Filled 2023-01-08 (×4): qty 1

## 2023-01-08 MED ORDER — ROSUVASTATIN CALCIUM 20 MG PO TABS
20.0000 mg | ORAL_TABLET | Freq: Every day | ORAL | Status: DC
  Administered 2023-01-08 – 2023-01-13 (×6): 20 mg via ORAL
  Filled 2023-01-08 (×6): qty 1

## 2023-01-08 MED ORDER — ALBUTEROL SULFATE (2.5 MG/3ML) 0.083% IN NEBU
2.5000 mg | INHALATION_SOLUTION | RESPIRATORY_TRACT | Status: DC | PRN
  Administered 2023-01-08 – 2023-01-12 (×3): 2.5 mg via RESPIRATORY_TRACT
  Filled 2023-01-08 (×3): qty 3

## 2023-01-08 MED ORDER — MIRABEGRON ER 25 MG PO TB24
25.0000 mg | ORAL_TABLET | Freq: Every day | ORAL | Status: DC
  Administered 2023-01-08 – 2023-01-13 (×6): 25 mg via ORAL
  Filled 2023-01-08 (×6): qty 1

## 2023-01-08 MED ORDER — GUAIFENESIN ER 600 MG PO TB12
600.0000 mg | ORAL_TABLET | Freq: Two times a day (BID) | ORAL | Status: DC
  Administered 2023-01-08: 600 mg via ORAL
  Filled 2023-01-08: qty 1

## 2023-01-08 MED ORDER — LEVOTHYROXINE SODIUM 25 MCG PO TABS
125.0000 ug | ORAL_TABLET | Freq: Every day | ORAL | Status: DC
  Administered 2023-01-08 – 2023-01-13 (×6): 125 ug via ORAL
  Filled 2023-01-08 (×6): qty 1

## 2023-01-08 MED ORDER — ENSURE ENLIVE PO LIQD
237.0000 mL | Freq: Two times a day (BID) | ORAL | Status: DC
  Administered 2023-01-08 – 2023-01-13 (×4): 237 mL via ORAL

## 2023-01-08 MED ORDER — ALBUTEROL SULFATE (2.5 MG/3ML) 0.083% IN NEBU
2.5000 mg | INHALATION_SOLUTION | Freq: Three times a day (TID) | RESPIRATORY_TRACT | Status: DC
  Administered 2023-01-09 – 2023-01-13 (×13): 2.5 mg via RESPIRATORY_TRACT
  Filled 2023-01-08 (×13): qty 3

## 2023-01-08 NOTE — Progress Notes (Signed)
Brief same day note:  Patient is a 50 female with history of COPD on 2 L of oxygen at baseline, non small cell lung cancer in remission who presented to the emergency room with complaint of shortness of breath, cough.  She was recently admitted in [place] for pneumonia last month.  Patient was admitted for management of acute COPD exacerbation.  Patient seen and examined at bedside today.  She looks very deconditioned.  Less coughing and was having bilateral expiratory wheezing   Assessment and plan:  Acute COPD exacerbation: Presented with shortness of breath, cough, found to be wheezing.  Started on IV steroids, bronchodilators.  Also on ceftriaxone COVID/flu/RSV negative. She was to follow up with pulmonology as an outpatient.  Chronic hypoxic respiratory failure: Uses 2 L of oxygen at home at baseline for COPD.  Currently on the same  History of lung cancer: History of non-small cell lung cancer.  Status post surgical resection.  Currently believed to be in remission.  Follows with [facility] oncology.  We recommend to follow-up with oncology as an outpatient.  She needs to reestablish her care with her previous oncologist.  Hypothyroidism: Continue Synthroid  Protein calorie malnutrition: Will request dietitian consult.  Patient looks malnourished.

## 2023-01-08 NOTE — Assessment & Plan Note (Addendum)
COPD pathway Scheduled and PRN nebs Solumedrol Cont pulse ox Adult wheeze protocol Covid, flu, RSV neg Empiric rocephin for COPD

## 2023-01-08 NOTE — H&P (Signed)
History and Physical    Patient: Tracy Park WUJ:[id number] DOB: 08-Mar-1956 DOA: 01/07/2023 DOS: the patient was seen and examined on 01/08/2023 PCP: Jacquelin Hawking, PA-C  Patient coming from: Home  Chief Complaint:  Chief Complaint  Patient presents with   Shortness of Breath   Cough   HPI: DARIANA Park is a 67 y.o. female with medical history significant of COPD on 2L at baseline, NSCLC in remission.  Pt in to ED with c/o SOB and cough.  Pt admitted to hospital in [place] for PNA last month.  Just moved back to [place] area.  Pt with SOB and cough that began yesterday AM, progressed in severity since.  Yellow productive cough.  EMS gave 3 duonebs and 125 solumedrol.  Pt with additional neb treatments in ED, still persistently wheezy.   Review of Systems: As mentioned in the history of present illness. All other systems reviewed and are negative. Past Medical History:  Diagnosis Date   Anginal pain (HCC)    Anxiety    Bipolar disorder (HCC)    Cancer (HCC)    COPD (chronic obstructive pulmonary disease) (HCC)    Dyspnea    Family history of adverse reaction to anesthesia    History of kidney stones    Hydroureteronephrosis 08/16/2021   Hypothyroidism    Lung cancer (HCC)    Myocardial infarction (HCC)    PTSD (post-traumatic stress disorder)    Sleep apnea    Thyroid disease    Past Surgical History:  Procedure Laterality Date   ABDOMINAL HYSTERECTOMY     BACK SURGERY     CYSTOSCOPY W/ URETERAL STENT PLACEMENT Right 05/03/2021   Procedure: CYSTOSCOPY WITH RETROGRADE PYELOGRAM/URETERAL STENT PLACEMENT;  Surgeon: Crist Fat, MD;  Location: [facility];  Service: Urology;  Laterality: Right;   EYE SURGERY     kidney stent     thyroidectomy     Social History:  reports that she has been smoking cigarettes. She has been smoking an average of .15 packs per day. She has never used smokeless tobacco. She reports that she does not currently use alcohol.  She reports that she does not currently use drugs.  Allergies  Allergen Reactions   Red Dye Hives and Itching   Oxycontin [Oxycodone Hcl] Other (See Comments)    Hallucinations    Prednisone Hives and Nausea And Vomiting   Aspirin Hives   Tape Rash    Prefers paper tape   Wound Dressing Adhesive Rash    Family History  Family history unknown: Yes    Prior to Admission medications   Medication Sig Start Date End Date Taking? Authorizing Provider  albuterol (VENTOLIN HFA) 108 (90 Base) MCG/ACT inhaler Inhale 2 puffs into the lungs every 6 (six) hours as needed for wheezing or shortness of breath.   Yes [provider]  Budeson-Glycopyrrol-Formoterol (BREZTRI AEROSPHERE) 160-9-4.8 MCG/ACT AERO Inhale 1 puff into the lungs in the morning and at bedtime.   Yes [provider]  guaiFENesin (MUCINEX) 600 MG 12 hr tablet Take 1 tablet (600 mg total) by mouth 2 (two) times daily. 03/16/22  Yes Rhetta Mura, MD  levothyroxine (SYNTHROID) 125 MCG tablet Take 125 mcg by mouth daily before breakfast.   Yes [provider]  losartan (COZAAR) 25 MG tablet Take 12.5 mg by mouth daily.   Yes [provider]  Magnesium Oxide 400 MG CAPS Take 1 capsule (400 mg total) by mouth daily at 12 noon. 03/01/22  Yes Xu,  Parke Poisson, MD  nitroGLYCERIN (NITROSTAT) 0.4 MG SL tablet Place 0.4 mg under the tongue every 5 (five) minutes as needed for chest pain.   Yes [provider]  pantoprazole (PROTONIX) 40 MG tablet Take 40 mg by mouth daily.   Yes [provider]  rosuvastatin (CRESTOR) 20 MG tablet Take 20 mg by mouth daily. 01/15/20  Yes [provider]  tamsulosin (FLOMAX) 0.4 MG CAPS capsule Take 0.4 mg by mouth daily. 03/27/22  Yes [provider]  Vibegron (GEMTESA) 75 MG TABS Take 1 tablet by mouth daily.   Yes [provider]  acetaminophen (TYLENOL) 325 MG tablet Take 2 tablets (650 mg total) by mouth every 6 (six) hours as  needed for mild pain (or Fever >/= 101). Patient not taking: Reported on 01/07/2023 03/16/22   Rhetta Mura, MD  cyclobenzaprine (FLEXERIL) 10 MG tablet Take 1 tablet (10 mg total) by mouth 2 (two) times daily as needed for muscle spasms. Patient not taking: Reported on 01/07/2023 03/29/22   Carroll Sage, PA-C  famotidine (PEPCID) 40 MG tablet Take 40 mg by mouth daily. Patient not taking: Reported on 01/07/2023 02/14/20   [provider]  lidocaine (LIDODERM) 5 % Place 1 patch onto the skin daily. Remove & Discard patch within 12 hours or as directed by MD Patient not taking: Reported on 03/13/2022 03/11/22   Lorre Nick, MD  nicotine (NICODERM CQ - DOSED IN MG/24 HOURS) 14 mg/24hr patch Place 1 patch (14 mg total) onto the skin daily. Patient not taking: Reported on 03/30/2022 03/28/22 03/28/23  Leatha Gilding, MD  nystatin (MYCOSTATIN) 100000 UNIT/ML suspension Take 5 mLs (500,000 Units total) by mouth 4 (four) times daily. Patient not taking: Reported on 01/07/2023 03/28/22   Leatha Gilding, MD  OXYGEN Inhale 2 L into the lungs daily as needed (for low oxygen).    [provider]    Physical Exam: Vitals:   01/07/23 2315 01/07/23 2330 01/08/23 0000 01/08/23 0030  BP: 115/68 112/62 102/61 106/62  Pulse: (!) 105 (!) 108 (!) 111 (!) 106  Resp: (!) 23 (!) 26 (!) 31 (!) 24  Temp:      TempSrc:      SpO2: 100% 100% 100% 93%   Constitutional: NAD, calm, comfortable Respiratory: Diffuse wheezing, prolonged expiratory phase, pursed lip breathing, frequent cough Cardiovascular: Regular rate and rhythm, no murmurs / rubs / gallops. No extremity edema. 2+ pedal pulses. No carotid bruits.  Abdomen: no tenderness, no masses palpated. No hepatosplenomegaly. Bowel sounds positive.  Neurologic: CN 2-12 grossly intact. Sensation intact, DTR normal. Strength 5/5 in all 4.  Psychiatric: Normal judgment and insight. Alert and oriented x 3. Normal mood.   Data Reviewed:        Latest Ref Rng & Units 01/08/2023   12:31 AM 01/07/2023   10:12 PM 04/01/2022   10:16 PM  CBC  WBC 4.0 - 10.5 K/uL  10.1  13.1   Hemoglobin 12.0 - 15.0 g/dL 16.1  09.6  04.5   Hematocrit 36.0 - 46.0 % 36.0  38.6  34.4   Platelets 150 - 400 K/uL  383  461       Latest Ref Rng & Units 01/08/2023   12:31 AM 01/07/2023   10:12 PM 04/01/2022   10:16 PM  CMP  Glucose 70 - 99 mg/dL 409  811  99   BUN 8 - 23 mg/dL 9  9  20    Creatinine 0.44 - 1.00 mg/dL 9.14  0.63  0.42   Sodium 135 - 145 mmol/L 138  138  141   Potassium 3.5 - 5.1 mmol/L 3.9  4.0  3.2   Chloride 98 - 111 mmol/L 97  97  106   CO2 22 - 32 mmol/L  33  28   Calcium 8.9 - 10.3 mg/dL  8.5  9.0   Total Protein 6.5 - 8.1 g/dL  6.5    Total Bilirubin 0.3 - 1.2 mg/dL  0.1    Alkaline Phos 38 - 126 U/L  72    AST 15 - 41 U/L  13    ALT 0 - 44 U/L  15     CXR = IMPRESSION: Chronic scarring in the left apex.  No acute abnormality noted.  Covid, flu, rsv = neg  Assessment and Plan: * COPD exacerbation (HCC) COPD pathway Scheduled and PRN nebs Solumedrol Cont pulse ox Adult wheeze protocol Covid, flu, RSV neg Empiric rocephin for COPD  History of lung cancer In 2018, believed to be in remission following surgical resection.  Follows with [facility]. Sounds like she had a CT chest last month while in [place] while hospitalized for PNA.  No mention of recurrence per pt though I can't see the CT nor read. Ill defer to day team if they want to get one this admission vs follow up with Onc  Hypothyroidism (acquired) Cont synthroid      Advance Care Planning:   Code Status: DNR DNR/DNI per patient.  May use medications, no vent, no shocks.  Consults: None  Family Communication: No family in room  Severity of Illness: The appropriate patient status for this patient is OBSERVATION. Observation status is judged to be reasonable and necessary in order to provide the required intensity of service to ensure the patient's  safety. The patient's presenting symptoms, physical exam findings, and initial radiographic and laboratory data in the context of their medical condition is felt to place them at decreased risk for further clinical deterioration. Furthermore, it is anticipated that the patient will be medically stable for discharge from the hospital within 2 midnights of admission.   Author: Hillary Bow., DO 01/08/2023 1:18 AM  For on call review [email or web address].

## 2023-01-08 NOTE — ED Provider Notes (Signed)
I saw the patient in conjunction with orienting APP Sharyl Nimrod.    Patient has significant wheezing.  Still wheezy despite treatments by EMS.  See Meredith's note for details.  Feel that patient needs admission for COPD exacerbation.  I discussed the case with Dr. Julian Reil, who is appreciated for admitting.   Roxy Horseman, PA-C 01/08/23 0112    Molpus, Jonny Ruiz, MD 01/08/23 Earle Gell

## 2023-01-08 NOTE — Evaluation (Signed)
Occupational Therapy Evaluation Patient Details Name: Tracy Park MRN: [id number] DOB: 01/25/1956 Today's Date: 01/08/2023   History of Present Illness 67 yo medically complex female admitted to hospital on 01/07/2023 due to wheezing, SOB with productive cough, L side chest pain and hypoxia on 2 L/min. CXR negative for acute findings. Pt was recently hospitalized in [place] for PNA. Pt has PMH including but not limited to: angina, anxiety, bipolar disorder, lung cancer, COPD, supplemental O2, OSA, MI, PTSD and back sx.   Clinical Impression   Pt is at baseline Ind level of function with ADLs and Sup with mobility without using AD.  Pt's O2 SATs dropping with minimal functional activity exertion and ADL mobility. 86% on 2L O2 after walking to bathroom, transferring to toilet and steppng in and out of shower. Frequent productive cough noted, no reports of SOB. PTA pt lived with her daughter and son on law and was Ind with ADLs, IADLs, home mgt, light cooking and drives. Pt would benefit  from 1-2 additional visits for energy conservation training. O2 saturation on 2 L/min at rest on 2 L/min 96% O2 saturation on 2 L/min following functional mobility/ADL tasks 86% with cues for pursed lip breathing seated EOB 34s to recover to 90% on 2 L/min O2 saturation on 2 L/min s/p gait tasks 88% and recovered to 90% once seated      Recommendations for follow up therapy are one component of a multi-disciplinary discharge planning process, led by the attending physician.  Recommendations may be updated based on patient status, additional functional criteria and insurance authorization.   Assistance Recommended at Discharge Intermittent Supervision/Assistance  Patient can return home with the following Assistance with cooking/housework    Functional Status Assessment     Equipment Recommendations  Tub/shower seat    Recommendations for Other Services       Precautions / Restrictions  Precautions Precautions: Fall Precaution Comments: watch O2 SATs Restrictions Weight Bearing Restrictions: No      Mobility Bed Mobility Overal bed mobility: Modified Independent             General bed mobility comments: HOB elevated    Transfers Overall transfer level: Needs assistance Equipment used: None Transfers: Sit to/from Stand Sit to Stand: Supervision           General transfer comment: for safety and [place] tube management      Balance Overall balance assessment: Mild deficits observed, not formally tested                                         ADL either performed or assessed with clinical judgement   ADL Overall ADL's : Modified independent                                       General ADL Comments: pt's O2 SATs dropping with minimal functional activity exertion and ADL mobility. 86% on 2L O2 after walking to bathroom, transferring to toilet and steppng in and out of shower     Vision Baseline Vision/History: 1 Wears glasses Ability to See in Adequate Light: 0 Adequate Patient Visual Report: No change from baseline       Perception     Praxis      Pertinent Vitals/Pain Pain Assessment Pain Assessment: 0-10 Pain Score: 8  Pain Location: abdomen Pain Descriptors / Indicators: Tender Pain Intervention(s): Monitored during session, Repositioned     Hand Dominance Right   Extremity/Trunk Assessment Upper Extremity Assessment Upper Extremity Assessment: Overall WFL for tasks assessed   Lower Extremity Assessment Lower Extremity Assessment: Defer to PT evaluation   Cervical / Trunk Assessment Cervical / Trunk Assessment: Normal   Communication Communication Communication: No difficulties   Cognition Arousal/Alertness: Awake/alert Behavior During Therapy: WFL for tasks assessed/performed Overall Cognitive Status: Within Functional Limits for tasks assessed                                        General Comments       Exercises     Shoulder Instructions      Home Living Family/patient expects to be discharged to:: Private residence Living Arrangements: Children Available Help at Discharge: Family;Available 24 hours/day Type of Home: House Home Access: Level entry     Home Layout: One level     Bathroom Shower/Tub: Chief Strategy Officer: Standard Bathroom Accessibility: Yes   Home Equipment: Cane - single point   Additional Comments: pt reports she is supposed to wear O2 24/7 (she also mentioned she is supposed to wear if O2 sat level drops below 89%)      Prior Functioning/Environment Prior Level of Function : Independent/Modified Independent             Mobility Comments: Reports she uses SPC in home but recently lost it ADLs Comments: Ind with ADLs/selfcare, IADLs, home mgt, cooking, drives        OT Problem List: Decreased activity tolerance;Pain      OT Treatment/Interventions: Self-care/ADL training;Energy conservation;Therapeutic activities;Patient/family education;DME and/or AE instruction    OT Goals(Current goals can be found in the care plan section) Acute Rehab OT Goals Patient Stated Goal: go home OT Goal Formulation: With patient Time For Goal Achievement: 01/22/23 Potential to Achieve Goals: Good ADL Goals Additional ADL Goal #1: Pt will verbalize and demo 3 energy conservation strategies for ADLs and ADL mobility with O2 SATs >16%  OT Frequency: Min 2X/week    Co-evaluation   Reason for Co-Treatment: Complexity of the patient's impairments (multi-system involvement);To address functional/ADL transfers PT goals addressed during session: Mobility/safety with mobility;Balance OT goals addressed during session: ADL's and self-care      AM-PAC OT "6 Clicks" Daily Activity     Outcome Measure Help from another person eating meals?: None Help from another person taking care of personal grooming?: None Help  from another person toileting, which includes using toliet, bedpan, or urinal?: None Help from another person bathing (including washing, rinsing, drying)?: None Help from another person to put on and taking off regular upper body clothing?: None Help from another person to put on and taking off regular lower body clothing?: None 6 Click Score: 24   End of Session    Activity Tolerance: Patient tolerated treatment well Patient left: in chair;with call bell/phone within reach  OT Visit Diagnosis: Muscle weakness (generalized) (M62.81)                Time: 1096-0454 OT Time Calculation (min): 28 min Charges:  OT General Charges $OT Visit: 1 Visit OT Evaluation $OT Eval Low Complexity: 1 Low    Galen Manila 01/08/2023, 1:58 PM

## 2023-01-08 NOTE — Evaluation (Addendum)
Physical Therapy Evaluation Patient Details Name: Tracy Park MRN: [id number] DOB: [birth year] Today's Date: 01/08/2023  History of Present Illness  67 yo medically complex female admitted to hospital on 01/07/2023 due to wheezing, SOB with productive cough, L side chest pain and hypoxia on 2 L/min. CXR negative for acute findings. Pt was recently hospitalized in [place] for PNA. Pt has PMH including but not limited to: angina, anxiety, bipolar disorder, lung cancer, COPD, supplemental O2, OSA, MI, PTSD and back sx.  Clinical Impression    Pt admitted with above diagnosis.  Pt currently with functional limitations due to the deficits listed below (see PT Problem List). Pt in bed when therapist arrived. Pt agreeable to therapy intervention. Pt on 2 L/min baseline and at time of eval. Frequent productive cough noted, no reports of SOB. Pt is mod I for bed mobility, S and cues for safety and [place] tube management with functional transfer tasks, close S for gait tasks without AD with slow cadence and short stride length for 150 feet. PT assessed pt response and O2 saturation t/o eval, please see below. Pt will benefit from acute skilled PT to increase their independence and safety with mobility to allow discharge.   O2 saturation on 2 L/min at rest on 2 L/min 96% O2 saturation on 2 L/min following functional mobility/ADL tasks 86% with cues for pursed lip breathing seated EOB 34s to recover to 90% on 2 L/min O2 saturation on 2 L/min s/p gait tasks 88% and recovered to 90% once seated      Recommendations for follow up therapy are one component of a multi-disciplinary discharge planning process, led by the attending physician.  Recommendations may be updated based on patient status, additional functional criteria and insurance authorization.  Follow Up Recommendations       Assistance Recommended at Discharge Intermittent Supervision/Assistance  Patient can return home with the following  A little help  with walking and/or transfers;A little help with bathing/dressing/bathroom;Assistance with cooking/housework;Assist for transportation;Help with stairs or ramp for entrance    Equipment Recommendations None recommended by PT  Recommendations for Other Services       Functional Status Assessment Patient has had a recent decline in their functional status and demonstrates the ability to make significant improvements in function in a reasonable and predictable amount of time.     Precautions / Restrictions Precautions Precautions: Fall Restrictions Weight Bearing Restrictions: No      Mobility  Bed Mobility Overal bed mobility: Modified Independent             General bed mobility comments: HOB elevated    Transfers Overall transfer level: Needs assistance Equipment used: None Transfers: Sit to/from Stand Sit to Stand: Supervision           General transfer comment: for safety and [place] tube management    Ambulation/Gait Ambulation/Gait assistance: Supervision Gait Distance (Feet): 150 Feet Assistive device: None Gait Pattern/deviations: Step-through pattern, Narrow base of support Gait velocity: decreased        Stairs            Wheelchair Mobility    Modified Rankin (Stroke Patients Only)       Balance Overall balance assessment: Mild deficits observed, not formally tested                                           Pertinent Vitals/Pain Pain  Assessment Pain Assessment: 0-10 Pain Score: 8  Pain Location: abdomen Pain Descriptors / Indicators: Tender (big ball) Pain Intervention(s): Monitored during session, Limited activity within patient's tolerance    Home Living Family/patient expects to be discharged to:: Private residence Living Arrangements: Children Available Help at Discharge: Family Type of Home: House Home Access: Level entry       Home Layout: One level Home Equipment: Cane - single point Additional  Comments: pt reports she is supposed to wear O2 24/7 (she also mentioned she is supposed to wear if O2 sat level drops below 89%)    Prior Function Prior Level of Function : Independent/Modified Independent             Mobility Comments: Reports she uses SPC in home but recently lost it ADLs Comments: Ind with ADLs/selfcare, IADLs, home mgt, cooking, drives     Hand Dominance   Dominant Hand: Right    Extremity/Trunk Assessment        Lower Extremity Assessment Lower Extremity Assessment: Overall WFL for tasks assessed       Communication   Communication: No difficulties  Cognition Arousal/Alertness: Awake/alert Behavior During Therapy: WFL for tasks assessed/performed Overall Cognitive Status: Within Functional Limits for tasks assessed                                          General Comments      Exercises     Assessment/Plan    PT Assessment Patient needs continued PT services  PT Problem List Decreased activity tolerance;Decreased balance;Decreased mobility;Cardiopulmonary status limiting activity;Pain       PT Treatment Interventions Gait training;Stair training;Functional mobility training;Therapeutic activities;Therapeutic exercise;Balance training;Neuromuscular re-education;Patient/family education    PT Goals (Current goals can be found in the Care Plan section)  Acute Rehab PT Goals Patient Stated Goal: to get back home PT Goal Formulation: With patient Time For Goal Achievement: 01/22/23 Potential to Achieve Goals: Good    Frequency Min 1X/week     Co-evaluation PT/OT/SLP Co-Evaluation/Treatment: Yes Reason for Co-Treatment: Complexity of the patient's impairments (multi-system involvement);To address functional/ADL transfers PT goals addressed during session: Mobility/safety with mobility;Balance OT goals addressed during session: ADL's and self-care       AM-PAC PT "6 Clicks" Mobility  Outcome Measure Help needed  turning from your back to your side while in a flat bed without using bedrails?: None Help needed moving from lying on your back to sitting on the side of a flat bed without using bedrails?: None Help needed moving to and from a bed to a chair (including a wheelchair)?: A Little Help needed standing up from a chair using your arms (e.g., wheelchair or bedside chair)?: A Little Help needed to walk in hospital room?: A Little Help needed climbing 3-5 steps with a railing? : A Lot 6 Click Score: 19    End of Session Equipment Utilized During Treatment: Gait belt;Oxygen (2 L/min) Activity Tolerance: Patient tolerated treatment well Patient left: in chair;with call bell/phone within reach Nurse Communication: Mobility status PT Visit Diagnosis: Unsteadiness on feet (R26.81);Pain;Difficulty in walking, not elsewhere classified (R26.2) Pain - Right/Left:  (abdomen)    Time: 4098-1191 PT Time Calculation (min) (ACUTE ONLY): 28 min   Charges:   PT Evaluation $PT Eval Low Complexity: 1 Low PT Treatments $Gait Training: 8-22 mins         Rica Mote, PT   Francena Hanly  Kamira Mellette 01/08/2023, 12:02 PM

## 2023-01-08 NOTE — Assessment & Plan Note (Signed)
Cont synthroid 

## 2023-01-08 NOTE — Assessment & Plan Note (Addendum)
In 2018, believed to be in remission following surgical resection.  Follows with [facility]. Sounds like she had a CT chest last month while in [place] while hospitalized for PNA.  No mention of recurrence per pt though I can't see the CT nor read. Ill defer to day team if they want to get one this admission vs follow up with Onc

## 2023-01-08 NOTE — TOC Progression Note (Signed)
Transition of Care [facility]) - Progression Note    Patient Details  Name: Tracy Park MRN: [id number] Date of Birth: 04/12/1956  [facility]) CM/SW Contact  Beckie Busing, RN Phone Number:[phone]  01/08/2023, 3:44 PM  Clinical Narrative:     Transition of Care (TOC) Screening Note   Patient Details  Name: Tracy Park Date of Birth: 1955-12-04   [facility]) CM/SW Contact:    Beckie Busing, RN Phone Number: 01/08/2023, 3:44 PM    Transition of Care Department (TOC) has reviewed patient and no TOC needs have been identified at this time. We will continue to monitor patient advancement through interdisciplinary progression rounds. If new patient transition needs arise, please place a TOC consult.          Expected Discharge Plan and Services                                               Social Determinants of Health (SDOH) Interventions SDOH Screenings   Food Insecurity: No Food Insecurity (01/08/2023)  Housing: Low Risk  (01/08/2023)  Transportation Needs: No Transportation Needs (01/08/2023)  Utilities: Not At Risk (01/08/2023)  Tobacco Use: High Risk (01/07/2023)    Readmission Risk Interventions    03/01/2022    9:00 AM  Readmission Risk Prevention Plan  Transportation Screening Complete  PCP or Specialist Appt within 5-7 Days Complete  Home Care Screening Complete  Medication Review (RN CM) Complete

## 2023-01-08 NOTE — Progress Notes (Signed)
Initial Nutrition Assessment  INTERVENTION:   -Needs weight for admission  -Ensure Plus High Protein po BID, each supplement provides 350 kcal and 20 grams of protein.   -Liberalize diet to regular given suspected malnutrition  NUTRITION DIAGNOSIS:   Increased nutrient needs related to chronic illness as evidenced by estimated needs.  GOAL:   Patient will meet greater than or equal to 90% of their needs  MONITOR:   PO intake, Supplement acceptance, Weight trends, Labs, I & O's  REASON FOR ASSESSMENT:   Consult Assessment of nutrition requirement/status  ASSESSMENT:   42 female with history of COPD on 2 L of oxygen at baseline, non small cell lung cancer in remission who presented to the emergency room with complaint of shortness of breath, cough.  She was recently admitted in [place] for pneumonia last month.  Patient was admitted for management of acute COPD exacerbation.  Patient unavailable at times of visit, patient care and working with therapies. Will attempt to see patient at a later time.  Per chart review, pt with SOB. Consuming 100% of meals at this time. Will go ahead and add Ensure supplements given MDs concern for malnutrition. Will also liberalize diet. Pt needs a weight for this admission to assess weight status. Last weight recorded on 03/30/22.  Medications: MAG-OX  Labs reviewed.  NUTRITION - FOCUSED PHYSICAL EXAM:  Unable to complete at this time  Diet Order:   Diet Order             Diet regular Room service appropriate? Yes; Fluid consistency: Thin  Diet effective now                   EDUCATION NEEDS:   No education needs have been identified at this time  Skin:  Skin Assessment: Reviewed RN Assessment  Last BM:  PTA  Height:   Ht Readings from Last 1 Encounters:  03/30/22 5\' 3"  (1.6 m)    Weight:   Wt Readings from Last 1 Encounters:  03/30/22 46.7 kg    BMI:  There is no height or weight on file to calculate  BMI.  Estimated Nutritional Needs:   Kcal:  1550-1750  Protein:  75-90g  Fluid:  1.7L/day  Tilda Franco, MS, RD, LDN Inpatient Clinical Dietitian Contact information available via Amion

## 2023-01-08 NOTE — ED Notes (Signed)
ED TO INPATIENT HANDOFF REPORT  Name/Age/Gender Tracy Park 67 y.o. female  Code Status    Code Status Orders  (From admission, onward)           Start     Ordered   01/08/23 0115  Full code  Continuous       Question:  By:  Answer:  Other   01/08/23 0117           Code Status History     Date Active Date Inactive Code Status Order ID Comments User Context   03/24/2022 1159 03/28/2022 1546 Full Code [id number]  Bobette Mo, MD ED   03/14/2022 2132 03/16/2022 1602 Full Code [id number]  Karl Ito, MD Inpatient   03/14/2022 0331 03/14/2022 2132 DNR [id number]  Tomma Lightning, MD ED   03/13/2022 2149 03/14/2022 0331 DNR [id number]  Mansy, Vernetta Honey, MD ED   02/26/2022 0200 03/01/2022 1555 DNR [id number]  Anselm Jungling, DO ED   08/16/2021 2316 08/25/2021 2324 DNR [id number]  John Giovanni, MD ED   08/16/2021 2253 08/16/2021 2316 Full Code [id number]  John Giovanni, MD ED   05/03/2021 0052 05/05/2021 1756 Full Code [id number]  Eduard Clos, MD ED   04/10/2020 0342 04/12/2020 2137 Full Code [id number]  Eduard Clos, MD ED   04/28/2012 0926 04/28/2012 2047 Full Code [id number]  Kelby Aline, RN Inpatient       Home/SNF/Other Home  Chief Complaint COPD exacerbation [facility]) [J44.1]  Level of Care/Admitting Diagnosis ED Disposition     ED Disposition  Admit   Condition  --   Comment  Hospital Area: [facility] [[id number]]  Level of Care: Med-Surg [16]  May place patient in observation at [facility] or [facility] if equivalent level of care is available:: No  Covid Evaluation: Asymptomatic - no recent exposure (last 10 days) testing not required  Diagnosis: COPD exacerbation [facility]) [[id number]]  Admitting Physician: Hillary Bow [phone]  Attending Physician: Hillary Bow [phone]          Medical History Past Medical History:  Diagnosis Date   Anginal pain (HCC)    Anxiety    Bipolar disorder (HCC)    Cancer (HCC)     COPD (chronic obstructive pulmonary disease) (HCC)    Dyspnea    Family history of adverse reaction to anesthesia    History of kidney stones    Hydroureteronephrosis 08/16/2021   Hypothyroidism    Lung cancer (HCC)    Myocardial infarction (HCC)    PTSD (post-traumatic stress disorder)    Sleep apnea    Thyroid disease     Allergies Allergies  Allergen Reactions   Red Dye Hives and Itching   Oxycontin [Oxycodone Hcl] Other (See Comments)    Hallucinations    Prednisone Hives and Nausea And Vomiting   Aspirin Hives   Tape Rash    Prefers paper tape   Wound Dressing Adhesive Rash    IV Location/Drains/Wounds Patient Lines/Drains/Airways Status     Active Line/Drains/Airways     Name Placement date Placement time Site Days   Peripheral IV 01/07/23 18 G Left Antecubital 01/07/23  2201  Antecubital  1   Peripheral IV 01/07/23 20 G Anterior;Left Wrist 01/07/23  2330  Wrist  1            Labs/Imaging Results for orders placed or performed during the hospital encounter of 01/07/23 (from the past 48 hour(s))  Blood gas, venous (at [facility]  and [facility])     Status: Abnormal   Collection Time: 01/07/23 10:12 PM  Result Value Ref Range   pH, Ven 7.4 7.25 - 7.43   pCO2, Ven 65 (H) 44 - 60 mmHg   pO2, Ven <31 (LL) 32 - 45 mmHg    Comment: CRITICAL RESULT CALLED TO, READ BACK BY AND VERIFIED WITH: CARTER,S RN @2224  01/07/23 BY CHILDRESS,E    Bicarbonate 40.3 (H) 20.0 - 28.0 mmol/L   Acid-Base Excess 12.5 (H) 0.0 - 2.0 mmol/L   O2 Saturation 56.8 %   Patient temperature 37.0     Comment: Performed at [facility], [address], [place], [place] [address]  CBC with Differential/Platelet     Status: Abnormal   Collection Time: 01/07/23 10:12 PM  Result Value Ref Range   WBC 10.1 4.0 - 10.5 K/uL   RBC 4.12 3.87 - 5.11 MIL/uL   Hemoglobin 11.8 (L) 12.0 - 15.0 g/dL   HCT 60.4 54.0 - 98.1 %   MCV 93.7 80.0 - 100.0 fL   MCH 28.6 26.0 - 34.0 pg   MCHC 30.6 30.0 -  36.0 g/dL   RDW 19.1 47.8 - 29.5 %   Platelets 383 150 - 400 K/uL   nRBC 0.0 0.0 - 0.2 %   Neutrophils Relative % 59 %   Neutro Abs 6.1 1.7 - 7.7 K/uL   Lymphocytes Relative 30 %   Lymphs Abs 3.0 0.7 - 4.0 K/uL   Monocytes Relative 7 %   Monocytes Absolute 0.7 0.1 - 1.0 K/uL   Eosinophils Relative 1 %   Eosinophils Absolute 0.1 0.0 - 0.5 K/uL   Basophils Relative 1 %   Basophils Absolute 0.1 0.0 - 0.1 K/uL   Immature Granulocytes 2 %   Abs Immature Granulocytes 0.15 (H) 0.00 - 0.07 K/uL    Comment: Performed at [facility], [address], [place], [place] [address]  Comprehensive metabolic panel     Status: Abnormal   Collection Time: 01/07/23 10:12 PM  Result Value Ref Range   Sodium 138 135 - 145 mmol/L   Potassium 4.0 3.5 - 5.1 mmol/L   Chloride 97 (L) 98 - 111 mmol/L   CO2 33 (H) 22 - 32 mmol/L   Glucose, Bld 147 (H) 70 - 99 mg/dL    Comment: Glucose reference range applies only to samples taken after fasting for at least 8 hours.   BUN 9 8 - 23 mg/dL   Creatinine, Ser 8.65 0.44 - 1.00 mg/dL   Calcium 8.5 (L) 8.9 - 10.3 mg/dL   Total Protein 6.5 6.5 - 8.1 g/dL   Albumin 2.9 (L) 3.5 - 5.0 g/dL   AST 13 (L) 15 - 41 U/L   ALT 15 0 - 44 U/L   Alkaline Phosphatase 72 38 - 126 U/L   Total Bilirubin 0.1 (L) 0.3 - 1.2 mg/dL   GFR, Estimated >78 >46 mL/min    Comment: (NOTE) Calculated using the CKD-EPI Creatinine Equation (2021)    Anion gap 8 5 - 15    Comment: Performed at [facility], [address], [place], [place] [address]  Troponin I (High Sensitivity)     Status: None   Collection Time: 01/07/23 10:12 PM  Result Value Ref Range   Troponin I (High Sensitivity) 8 <18 ng/L    Comment: (NOTE) Elevated high sensitivity troponin I (hsTnI) values and significant  changes across serial measurements may suggest ACS but many other  chronic  and acute conditions are known to elevate hsTnI results.  Refer to the "Links" section for  chest pain algorithms and additional  guidance. Performed at [facility], [address], [place], [place] [address]   Brain natriuretic peptide     Status: None   Collection Time: 01/07/23 10:12 PM  Result Value Ref Range   B Natriuretic Peptide 82.8 0.0 - 100.0 pg/mL    Comment: Performed at [facility], [address], [place], [place] [address]  Resp panel by RT-PCR (RSV, Flu A&B, Covid) Peripheral     Status: None   Collection Time: 01/07/23 11:00 PM   Specimen: Peripheral; Nasal Swab  Result Value Ref Range   SARS Coronavirus 2 by RT PCR NEGATIVE NEGATIVE    Comment: (NOTE) SARS-CoV-2 target nucleic acids are NOT DETECTED.  The SARS-CoV-2 RNA is generally detectable in upper respiratory specimens during the acute phase of infection. The lowest concentration of SARS-CoV-2 viral copies this assay can detect is 138 copies/mL. A negative result does not preclude SARS-Cov-2 infection and should not be used as the sole basis for treatment or other patient management decisions. A negative result may occur with  improper specimen collection/handling, submission of specimen other than nasopharyngeal swab, presence of viral mutation(s) within the areas targeted by this assay, and inadequate number of viral copies(<138 copies/mL). A negative result must be combined with clinical observations, patient history, and epidemiological information. The expected result is Negative.  Fact Sheet for Patients:  BloggerCourse.com  Fact Sheet for Healthcare Providers:  SeriousBroker.it  This test is no t yet approved or cleared by the [place] FDA and  has been authorized for detection and/or diagnosis of SARS-CoV-2 by FDA under an Emergency Use Authorization (EUA). This EUA will remain  in effect (meaning this test can be used) for the duration of the COVID-19 declaration under Section 564(b)(1) of  the Act, 21 U.S.C.section 360bbb-3(b)(1), unless the authorization is terminated  or revoked sooner.       Influenza A by PCR NEGATIVE NEGATIVE   Influenza B by PCR NEGATIVE NEGATIVE    Comment: (NOTE) The Xpert Xpress SARS-CoV-2/FLU/RSV plus assay is intended as an aid in the diagnosis of influenza from Nasopharyngeal swab specimens and should not be used as a sole basis for treatment. Nasal washings and aspirates are unacceptable for Xpert Xpress SARS-CoV-2/FLU/RSV testing.  Fact Sheet for Patients: BloggerCourse.com  Fact Sheet for Healthcare Providers: SeriousBroker.it  This test is not yet approved or cleared by the [place] FDA and has been authorized for detection and/or diagnosis of SARS-CoV-2 by FDA under an Emergency Use Authorization (EUA). This EUA will remain in effect (meaning this test can be used) for the duration of the COVID-19 declaration under Section 564(b)(1) of the Act, 21 U.S.C. section 360bbb-3(b)(1), unless the authorization is terminated or revoked.     Resp Syncytial Virus by PCR NEGATIVE NEGATIVE    Comment: (NOTE) Fact Sheet for Patients: BloggerCourse.com  Fact Sheet for Healthcare Providers: SeriousBroker.it  This test is not yet approved or cleared by the [place] FDA and has been authorized for detection and/or diagnosis of SARS-CoV-2 by FDA under an Emergency Use Authorization (EUA). This EUA will remain in effect (meaning this test can be used) for the duration of the COVID-19 declaration under Section 564(b)(1) of the Act, 21 U.S.C. section 360bbb-3(b)(1), unless the authorization is terminated or revoked.  Performed at [facility], [address], [place], [place] [address]  Lactic acid, plasma     Status: None   Collection Time: 01/07/23 11:00 PM  Result Value Ref Range   Lactic Acid, Venous 0.8 0.5  - 1.9 mmol/L    Comment: Performed at [facility], [address] [place], [place] [address]  Lactic acid, plasma     Status: None   Collection Time: 01/08/23 12:24 AM  Result Value Ref Range   Lactic Acid, Venous 0.9 0.5 - 1.9 mmol/L    Comment: Performed at [facility], [address], [place], [place] [address]  I-stat chem 8, ED (not at [facility], [facility] or [facility])     Status: Abnormal   Collection Time: 01/08/23 12:31 AM  Result Value Ref Range   Sodium 138 135 - 145 mmol/L   Potassium 3.9 3.5 - 5.1 mmol/L   Chloride 97 (L) 98 - 111 mmol/L   BUN 9 8 - 23 mg/dL   Creatinine, Ser 8.29 (L) 0.44 - 1.00 mg/dL   Glucose, Bld 562 (H) 70 - 99 mg/dL    Comment: Glucose reference range applies only to samples taken after fasting for at least 8 hours.   Calcium, Ion 1.12 (L) 1.15 - 1.40 mmol/L   TCO2 36 (H) 22 - 32 mmol/L   Hemoglobin 12.2 12.0 - 15.0 g/dL   HCT 13.0 86.5 - 78.4 %   DG Chest Port 1 View  Result Date: 01/07/2023 CLINICAL DATA:  Shortness of breath EXAM: PORTABLE CHEST 1 VIEW COMPARISON:  04/01/2022 FINDINGS: Cardiac shadow is stable. Chronic scarring is noted in the left apex with pleural capping. No new focal infiltrate or effusion is seen. No bony abnormality is noted. IMPRESSION: Chronic scarring in the left apex.  No acute abnormality noted. Electronically Signed   By: Alcide Clever M.D.   On: 01/07/2023 23:01    Pending Labs Unresulted Labs (From admission, onward)     Start     Ordered   01/08/23 0500  CBC  Tomorrow morning,   R        01/08/23 0117   01/08/23 0500  Basic metabolic panel  Tomorrow morning,   R        01/08/23 0117   01/08/23 0500  HIV Antibody (routine testing w rflx)  (HIV Antibody (Routine testing w reflex) panel)  Once,   R        01/08/23 0122   01/07/23 2221  Culture, blood (routine x 2)  BLOOD CULTURE X 2,   R      01/07/23 2227            Vitals/Pain Today's Vitals   01/07/23 2315 01/07/23 2330  01/08/23 0000 01/08/23 0030  BP: 115/68 112/62 102/61 106/62  Pulse: (!) 105 (!) 108 (!) 111 (!) 106  Resp: (!) 23 (!) 26 (!) 31 (!) 24  Temp:      TempSrc:      SpO2: 100% 100% 100% 93%  PainSc:        Isolation Precautions No active isolations  Medications Medications  enoxaparin (LOVENOX) injection 40 mg (has no administration in time range)  cefTRIAXone (ROCEPHIN) 1 g in sodium chloride 0.9 % 100 mL IVPB (has no administration in time range)  albuterol (PROVENTIL) (2.5 MG/3ML) 0.083% nebulizer solution 2.5 mg (has no administration in time range)  umeclidinium bromide (INCRUSE ELLIPTA) 62.5 MCG/ACT 1 puff (has no administration in time range)  mometasone-formoterol (DULERA) 200-5 MCG/ACT inhaler 2 puff (has no administration in time range)  methylPREDNISolone  sodium succinate (SOLU-MEDROL) 40 mg/mL injection 40 mg (has no administration in time range)  losartan (COZAAR) tablet 12.5 mg (has no administration in time range)  levothyroxine (SYNTHROID) tablet 125 mcg (has no administration in time range)  guaiFENesin (MUCINEX) 12 hr tablet 600 mg (has no administration in time range)  magnesium oxide (MAG-OX) tablet 400 mg (has no administration in time range)  rosuvastatin (CRESTOR) tablet 20 mg (has no administration in time range)  pantoprazole (PROTONIX) EC tablet 40 mg (has no administration in time range)  mirabegron ER (MYRBETRIQ) tablet 25 mg (has no administration in time range)  tamsulosin (FLOMAX) capsule 0.4 mg (has no administration in time range)  albuterol (PROVENTIL) (2.5 MG/3ML) 0.083% nebulizer solution (10 mg/hr Nebulization Given 01/07/23 2302)    Mobility walks with person assist

## 2023-01-09 DIAGNOSIS — Z8249 Family history of ischemic heart disease and other diseases of the circulatory system: Secondary | ICD-10-CM | POA: Diagnosis not present

## 2023-01-09 DIAGNOSIS — G4730 Sleep apnea, unspecified: Secondary | ICD-10-CM | POA: Diagnosis present

## 2023-01-09 DIAGNOSIS — Z79890 Hormone replacement therapy (postmenopausal): Secondary | ICD-10-CM | POA: Diagnosis not present

## 2023-01-09 DIAGNOSIS — Z886 Allergy status to analgesic agent status: Secondary | ICD-10-CM | POA: Diagnosis not present

## 2023-01-09 DIAGNOSIS — J441 Chronic obstructive pulmonary disease with (acute) exacerbation: Secondary | ICD-10-CM | POA: Diagnosis present

## 2023-01-09 DIAGNOSIS — G4733 Obstructive sleep apnea (adult) (pediatric): Secondary | ICD-10-CM | POA: Diagnosis present

## 2023-01-09 DIAGNOSIS — Z87442 Personal history of urinary calculi: Secondary | ICD-10-CM | POA: Diagnosis not present

## 2023-01-09 DIAGNOSIS — E46 Unspecified protein-calorie malnutrition: Secondary | ICD-10-CM | POA: Diagnosis present

## 2023-01-09 DIAGNOSIS — Z1152 Encounter for screening for COVID-19: Secondary | ICD-10-CM | POA: Diagnosis not present

## 2023-01-09 DIAGNOSIS — R911 Solitary pulmonary nodule: Secondary | ICD-10-CM | POA: Diagnosis present

## 2023-01-09 DIAGNOSIS — F17210 Nicotine dependence, cigarettes, uncomplicated: Secondary | ICD-10-CM | POA: Diagnosis present

## 2023-01-09 DIAGNOSIS — Z90710 Acquired absence of both cervix and uterus: Secondary | ICD-10-CM | POA: Diagnosis not present

## 2023-01-09 DIAGNOSIS — F319 Bipolar disorder, unspecified: Secondary | ICD-10-CM | POA: Diagnosis present

## 2023-01-09 DIAGNOSIS — Z79899 Other long term (current) drug therapy: Secondary | ICD-10-CM | POA: Diagnosis not present

## 2023-01-09 DIAGNOSIS — Z681 Body mass index (BMI) 19 or less, adult: Secondary | ICD-10-CM | POA: Diagnosis not present

## 2023-01-09 DIAGNOSIS — J9621 Acute and chronic respiratory failure with hypoxia: Secondary | ICD-10-CM | POA: Diagnosis present

## 2023-01-09 DIAGNOSIS — F4310 Post-traumatic stress disorder, unspecified: Secondary | ICD-10-CM | POA: Diagnosis present

## 2023-01-09 DIAGNOSIS — Z85118 Personal history of other malignant neoplasm of bronchus and lung: Secondary | ICD-10-CM | POA: Diagnosis not present

## 2023-01-09 DIAGNOSIS — I10 Essential (primary) hypertension: Secondary | ICD-10-CM | POA: Diagnosis present

## 2023-01-09 DIAGNOSIS — Z9981 Dependence on supplemental oxygen: Secondary | ICD-10-CM | POA: Diagnosis not present

## 2023-01-09 DIAGNOSIS — E039 Hypothyroidism, unspecified: Secondary | ICD-10-CM | POA: Diagnosis present

## 2023-01-09 DIAGNOSIS — Z902 Acquired absence of lung [part of]: Secondary | ICD-10-CM | POA: Diagnosis not present

## 2023-01-09 DIAGNOSIS — Z66 Do not resuscitate: Secondary | ICD-10-CM | POA: Diagnosis present

## 2023-01-09 DIAGNOSIS — I252 Old myocardial infarction: Secondary | ICD-10-CM | POA: Diagnosis not present

## 2023-01-09 MED ORDER — MELATONIN 3 MG PO TABS: 3.0000 mg | ORAL_TABLET | Freq: Every day | ORAL | Status: DC

## 2023-01-09 MED ORDER — BUDESONIDE 0.5 MG/2ML IN SUSP
0.5000 mg | Freq: Two times a day (BID) | RESPIRATORY_TRACT | Status: DC
  Administered 2023-01-09 – 2023-01-13 (×8): 0.5 mg via RESPIRATORY_TRACT
  Filled 2023-01-09 (×8): qty 2

## 2023-01-09 MED ORDER — ENOXAPARIN SODIUM 30 MG/0.3ML IJ SOSY
30.0000 mg | PREFILLED_SYRINGE | INTRAMUSCULAR | Status: DC
  Administered 2023-01-09 – 2023-01-11 (×3): 30 mg via SUBCUTANEOUS
  Filled 2023-01-09 (×3): qty 0.3

## 2023-01-09 MED ORDER — ALPRAZOLAM 0.5 MG PO TABS
0.5000 mg | ORAL_TABLET | Freq: Three times a day (TID) | ORAL | Status: DC | PRN
  Administered 2023-01-09 – 2023-01-12 (×5): 0.5 mg via ORAL
  Filled 2023-01-09 (×5): qty 1

## 2023-01-09 MED ORDER — HYDROCOD POLI-CHLORPHE POLI ER 10-8 MG/5ML PO SUER
5.0000 mL | Freq: Two times a day (BID) | ORAL | Status: DC
  Administered 2023-01-09 – 2023-01-13 (×9): 5 mL via ORAL
  Filled 2023-01-09 (×9): qty 5

## 2023-01-09 MED ORDER — GUAIFENESIN ER 600 MG PO TB12
1200.0000 mg | ORAL_TABLET | Freq: Two times a day (BID) | ORAL | Status: DC
  Administered 2023-01-09 – 2023-01-13 (×8): 1200 mg via ORAL
  Filled 2023-01-09 (×8): qty 2

## 2023-01-09 NOTE — Progress Notes (Signed)
PROGRESS NOTE  Tracy Park  ZOX:[id number] DOB: Nov 11, 1955 DOA: 01/07/2023 PCP: Jacquelin Hawking, PA-C   Brief Narrative: Patient is a 59 female with history of COPD on 2 L of oxygen at baseline, non small cell lung cancer in remission who presented to the emergency room with complaint of shortness of breath, cough.  She was recently admitted in [place] for pneumonia last month.  Patient was admitted for management of acute COPD exacerbation.    Assessment & Plan:  Principal Problem:   COPD exacerbation (HCC) Active Problems:   Hypothyroidism (acquired)   History of lung cancer  Acute COPD exacerbation: Presented with shortness of breath, cough, found to be wheezing.  Started on IV steroids, bronchodilators.  Also on ceftriaxone COVID/flu/RSV negative. She needs to follow up with pulmonology as an outpatient. Feels slightly better today, wheezings have slightly improved.  Continue IV steroids for today.  Added Pulmicort Continue Mucinex, tussionex   Chronic hypoxic respiratory failure: Uses 2 L of oxygen at home at baseline for COPD.  Currently on the same   History of lung cancer: History of non-small cell lung cancer.  Status post surgical resection.  Currently believed to be in remission.  Follows with [facility] oncology.  We recommend to follow-up with oncology as an outpatient.  She needs to reestablish her care with her previous oncologist.   Hypothyroidism: Continue Synthroid   Protein calorie malnutrition: Will requested dietitian consult.  Patient looks malnourished.  Hypertension: Blood pressure stable/soft.  On losartan at home,will hold  Anxiety: Started on xanax      Nutrition Problem: Increased nutrient needs Etiology: chronic illness    DVT prophylaxis:enoxaparin (LOVENOX) injection 30 mg Start: 01/09/23 1400     Code Status: DNR  Family Communication: Called and discussed with daughter on phone on 5/2  Patient status:Inpatient  Patient is from  :home  Anticipated discharge WJ:XBJY  Estimated DC date:2-3 days   Consultants: None  Procedures:None  Antimicrobials:  Anti-infectives (From admission, onward)    Start     Dose/Rate Route Frequency Ordered Stop   01/08/23 0130  cefTRIAXone (ROCEPHIN) 1 g in sodium chloride 0.9 % 100 mL IVPB        1 g 200 mL/hr over 30 Minutes Intravenous Every 24 hours 01/08/23 0117 01/13/23 0129       Subjective: Patient seen and examined the bedside today.  Hemodynamically stable.  On 2 L of oxygen which is her baseline.  She feels little better today.  She still has barking cough that is waking her up from sleep.  Objective: Vitals:   01/08/23 2114 01/09/23 0438 01/09/23 0534 01/09/23 0845  BP:   108/66   Pulse:   97   Resp:   18   Temp:   97.8 F (36.6 C)   TempSrc:   Oral   SpO2: 95% 96% 96% 95%  Weight:        Intake/Output Summary (Last 24 hours) at 01/09/2023 1259 Last data filed at 01/09/2023 0900 Gross per 24 hour  Intake 1100 ml  Output --  Net 1100 ml   Filed Weights   01/08/23 1538  Weight: 42 kg    Examination:  General exam: Overall comfortable, not in distress, thin built , looks weak HEENT: PERRL Respiratory system: Diminished sounds bilaterally, bilateral expiratory wheezing Cardiovascular system: S1 & S2 heard, RRR.  Gastrointestinal system: Abdomen is nondistended, soft and nontender. Central nervous system: Alert and oriented Extremities: No edema, no clubbing ,no cyanosis Skin: No rashes,  no ulcers,no icterus     Data Reviewed: I have personally reviewed following labs and imaging studies  CBC: Recent Labs  Lab 01/07/23 2212 01/08/23 0031 01/08/23 0604  WBC 10.1  --  6.7  NEUTROABS 6.1  --   --   HGB 11.8* 12.2 10.7*  HCT 38.6 36.0 35.2*  MCV 93.7  --  92.9  PLT 383  --  371   Basic Metabolic Panel: Recent Labs  Lab 01/07/23 2212 01/08/23 0031 01/08/23 0604  NA 138 138 138  K 4.0 3.9 4.4  CL 97* 97* 98  CO2 33*  --  33*   GLUCOSE 147* 223* 231*  BUN 9 9 13   CREATININE 0.63 0.40* 0.60  CALCIUM 8.5*  --  9.0     Recent Results (from the past 240 hour(s))  Resp panel by RT-PCR (RSV, Flu A&B, Covid) Peripheral     Status: None   Collection Time: 01/07/23 11:00 PM   Specimen: Peripheral; Nasal Swab  Result Value Ref Range Status   SARS Coronavirus 2 by RT PCR NEGATIVE NEGATIVE Final    Comment: (NOTE) SARS-CoV-2 target nucleic acids are NOT DETECTED.  The SARS-CoV-2 RNA is generally detectable in upper respiratory specimens during the acute phase of infection. The lowest concentration of SARS-CoV-2 viral copies this assay can detect is 138 copies/mL. A negative result does not preclude SARS-Cov-2 infection and should not be used as the sole basis for treatment or other patient management decisions. A negative result may occur with  improper specimen collection/handling, submission of specimen other than nasopharyngeal swab, presence of viral mutation(s) within the areas targeted by this assay, and inadequate number of viral copies(<138 copies/mL). A negative result must be combined with clinical observations, patient history, and epidemiological information. The expected result is Negative.  Fact Sheet for Patients:  BloggerCourse.com  Fact Sheet for Healthcare Providers:  SeriousBroker.it  This test is no t yet approved or cleared by the [place] FDA and  has been authorized for detection and/or diagnosis of SARS-CoV-2 by FDA under an Emergency Use Authorization (EUA). This EUA will remain  in effect (meaning this test can be used) for the duration of the COVID-19 declaration under Section 564(b)(1) of the Act, 21 U.S.C.section 360bbb-3(b)(1), unless the authorization is terminated  or revoked sooner.       Influenza A by PCR NEGATIVE NEGATIVE Final   Influenza B by PCR NEGATIVE NEGATIVE Final    Comment: (NOTE) The Xpert Xpress  SARS-CoV-2/FLU/RSV plus assay is intended as an aid in the diagnosis of influenza from Nasopharyngeal swab specimens and should not be used as a sole basis for treatment. Nasal washings and aspirates are unacceptable for Xpert Xpress SARS-CoV-2/FLU/RSV testing.  Fact Sheet for Patients: BloggerCourse.com  Fact Sheet for Healthcare Providers: SeriousBroker.it  This test is not yet approved or cleared by the [place] FDA and has been authorized for detection and/or diagnosis of SARS-CoV-2 by FDA under an Emergency Use Authorization (EUA). This EUA will remain in effect (meaning this test can be used) for the duration of the COVID-19 declaration under Section 564(b)(1) of the Act, 21 U.S.C. section 360bbb-3(b)(1), unless the authorization is terminated or revoked.     Resp Syncytial Virus by PCR NEGATIVE NEGATIVE Final    Comment: (NOTE) Fact Sheet for Patients: BloggerCourse.com  Fact Sheet for Healthcare Providers: SeriousBroker.it  This test is not yet approved or cleared by the [place] FDA and has been authorized for detection and/or diagnosis of SARS-CoV-2 by  FDA under an Emergency Use Authorization (EUA). This EUA will remain in effect (meaning this test can be used) for the duration of the COVID-19 declaration under Section 564(b)(1) of the Act, 21 U.S.C. section 360bbb-3(b)(1), unless the authorization is terminated or revoked.  Performed at [facility], [address], [place], [place] [address]   Culture, blood (routine x 2)     Status: None (Preliminary result)   Collection Time: 01/07/23 11:00 PM   Specimen: BLOOD  Result Value Ref Range Status   Specimen Description   Final    BLOOD BLOOD LEFT HAND Performed at [facility], [address], [place], [place] [address]    Special Requests   Final    BOTTLES DRAWN  AEROBIC AND ANAEROBIC Blood Culture adequate volume Performed at [facility], [address], [place], [place] [address]    Culture   Final    NO GROWTH 1 DAY Performed at [facility], [address] [place], [place] [address]    Report Status PENDING  Incomplete  Culture, blood (routine x 2)     Status: None (Preliminary result)   Collection Time: 01/07/23 11:50 PM   Specimen: BLOOD  Result Value Ref Range Status   Specimen Description   Final    BLOOD RIGHT ANTECUBITAL Performed at [facility], [address] [place], [place] [address]    Special Requests   Final    BOTTLES DRAWN AEROBIC AND ANAEROBIC Blood Culture adequate volume Performed at [facility], [address], [place], [place] [address]    Culture   Final    NO GROWTH 1 DAY Performed at [facility], [address], [place], [place] [address]    Report Status PENDING  Incomplete     Radiology Studies: DG Chest Port 1 View  Result Date: 01/07/2023 CLINICAL DATA:  Shortness of breath EXAM: PORTABLE CHEST 1 VIEW COMPARISON:  04/01/2022 FINDINGS: Cardiac shadow is stable. Chronic scarring is noted in the left apex with pleural capping. No new focal infiltrate or effusion is seen. No bony abnormality is noted. IMPRESSION: Chronic scarring in the left apex.  No acute abnormality noted. Electronically Signed   By: Alcide Clever M.D.   On: 01/07/2023 23:01    Scheduled Meds:  albuterol  2.5 mg Nebulization TID   budesonide (PULMICORT) nebulizer solution  0.5 mg Nebulization BID   chlorpheniramine-HYDROcodone  5 mL Oral Q12H   enoxaparin (LOVENOX) injection  30 mg Subcutaneous Q24H   feeding supplement  237 mL Oral BID BM   guaiFENesin  600 mg Oral BID   levothyroxine  125 mcg Oral Q0600   losartan  12.5 mg Oral Daily   magnesium oxide  400 mg Oral Q1200   methylPREDNISolone (SOLU-MEDROL) injection  40 mg Intravenous Q12H   mirabegron ER  25 mg  Oral Daily   pantoprazole  40 mg Oral Daily   rosuvastatin  20 mg Oral Daily   sodium chloride HYPERTONIC  4 mL Nebulization Daily   tamsulosin  0.4 mg Oral Daily   umeclidinium bromide  1 puff Inhalation Daily   Continuous Infusions:  cefTRIAXone (ROCEPHIN)  IV 1 g (01/09/23 0112)     LOS: 0 days   Burnadette Pop, MD [facility] P5/10/2022, 12:59 PM

## 2023-01-09 NOTE — Progress Notes (Signed)
Physical Therapy Treatment Patient Details Name: Tracy Park MRN: [id number] DOB: 12/17/1955 Today's Date: 01/09/2023   History of Present Illness 67 yo medically complex female admitted to hospital on 01/07/2023 due to wheezing, SOB with productive cough, L side chest pain and hypoxia on 2 L/min. CXR negative for acute findings. Pt was recently hospitalized in [place] for PNA. Pt has PMH including but not limited to: angina, anxiety, bipolar disorder, lung cancer, COPD, supplemental O2, OSA, MI, PTSD and back sx.    PT Comments    Pt mobilizing modified independently around room and also ambulated in hallway.  Pt eager to improve and agreeable to ambulate as tolerated multiple times per day (RN also aware). Pt agreeable to no further acute PT needs at this time.  PT to sign off.    Recommendations for follow up therapy are one component of a multi-disciplinary discharge planning process, led by the attending physician.  Recommendations may be updated based on patient status, additional functional criteria and insurance authorization.  Follow Up Recommendations       Assistance Recommended at Discharge Intermittent Supervision/Assistance  Patient can return home with the following Assistance with cooking/housework;Assist for transportation;Help with stairs or ramp for entrance   Equipment Recommendations  None recommended by PT    Recommendations for Other Services       Precautions / Restrictions Precautions Precautions: Fall Precaution Comments: typically 2L O2 baseline     Mobility  Bed Mobility Overal bed mobility: Modified Independent                  Transfers Overall transfer level: Modified independent                      Ambulation/Gait Ambulation/Gait assistance: Supervision, Modified independent (Device/Increase time) Gait Distance (Feet): 160 Feet Assistive device: None Gait Pattern/deviations: Step-through pattern, Narrow base of support Gait  velocity: decreased     General Gait Details: reports intentional slower pace, no overt LOB or unsteadiness observed, Spo2 91% on 2L during ambulation   Stairs             Wheelchair Mobility    Modified Rankin (Stroke Patients Only)       Balance                                            Cognition Arousal/Alertness: Awake/alert Behavior During Therapy: WFL for tasks assessed/performed Overall Cognitive Status: Within Functional Limits for tasks assessed                                          Exercises      General Comments        Pertinent Vitals/Pain Pain Assessment Pain Assessment: No/denies pain    Home Living                          Prior Function            PT Goals (current goals can now be found in the care plan section) Progress towards PT goals: Goals met/education completed, patient discharged from PT    Frequency    Min 1X/week      PT Plan Current plan remains appropriate    Co-evaluation  AM-PAC PT "6 Clicks" Mobility   Outcome Measure  Help needed turning from your back to your side while in a flat bed without using bedrails?: None Help needed moving from lying on your back to sitting on the side of a flat bed without using bedrails?: None Help needed moving to and from a bed to a chair (including a wheelchair)?: None Help needed standing up from a chair using your arms (e.g., wheelchair or bedside chair)?: None Help needed to walk in hospital room?: A Little Help needed climbing 3-5 steps with a railing? : A Little 6 Click Score: 22    End of Session Equipment Utilized During Treatment: Oxygen Activity Tolerance: Patient tolerated treatment well Patient left: in chair;with call bell/phone within reach Nurse Communication: Mobility status PT Visit Diagnosis: Difficulty in walking, not elsewhere classified (R26.2)     Time: 1030-1045 PT Time  Calculation (min) (ACUTE ONLY): 15 min  Charges:  $Gait Training: 8-22 mins                    Paulino Door, DPT Physical Therapist Acute Rehabilitation Services Office: [phone]    Tracy Park 01/09/2023, 1:07 PM

## 2023-01-10 ENCOUNTER — Inpatient Hospital Stay (HOSPITAL_COMMUNITY): Payer: 59

## 2023-01-10 DIAGNOSIS — J441 Chronic obstructive pulmonary disease with (acute) exacerbation: Secondary | ICD-10-CM | POA: Diagnosis not present

## 2023-01-10 MED ORDER — ARFORMOTEROL TARTRATE 15 MCG/2ML IN NEBU
15.0000 ug | INHALATION_SOLUTION | Freq: Two times a day (BID) | RESPIRATORY_TRACT | Status: DC
  Administered 2023-01-10 – 2023-01-13 (×3): 15 ug via RESPIRATORY_TRACT
  Filled 2023-01-10 (×8): qty 2

## 2023-01-10 MED ORDER — REVEFENACIN 175 MCG/3ML IN SOLN
175.0000 ug | Freq: Every day | RESPIRATORY_TRACT | Status: DC
  Administered 2023-01-10 – 2023-01-13 (×4): 175 ug via RESPIRATORY_TRACT
  Filled 2023-01-10 (×4): qty 3

## 2023-01-10 MED ORDER — DEXTROSE 5 % IV SOLN: 250.0000 mg | INTRAVENOUS | Status: DC

## 2023-01-10 MED ORDER — SODIUM CHLORIDE 0.9 % IV SOLN
500.0000 mg | INTRAVENOUS | Status: DC
  Administered 2023-01-10 – 2023-01-12 (×3): 500 mg via INTRAVENOUS
  Filled 2023-01-10 (×4): qty 5

## 2023-01-10 NOTE — Consult Note (Addendum)
NAME:  Tracy Park, MRN:  [id number], DOB:  04/10/1956, LOS: 1 ADMISSION DATE:  01/07/2023 CONSULTATION DATE:  01/10/2023 REFERRING MD:  Renford Dills - [facility] CHIEF COMPLAINT:  SOB, wheezing   History of Present Illness:  Tracy Park is seen in consultation at the request of Dr. Renford Dills [facility]) for recommendations on further evaluation and management of SOB/wheezing associated with COPD/chronic hypoxemic respiratory failure.   67 year old woman who presented to [facility] 4/30 with SOB, wheezing and productive cough. PMHx significant for COPD with acute-on-chronic hypoxemic respiratory failure (on HOT 2L, still actively smoking), OSA, NSCLC (in remission, s/p left upper lobectomy/XRT with [facility]), hypothyroidism, MI, bipolar disorder/anxiety, PTSD.   Recently moved back to the [place] area to be closer to family. Her husband passed in 2022 and she temporarily relocated to [place]. Recent admission in [place] for PNA, she was treated with oral antibiotics and prednisone. She states that she did not feel back to her baseline prior to leaving the hospital in [facility]; ultimately felt much more poorly on date of admission requiring increased O2 and EMS call for transfer to hospital. At that time, denied fever/chills, endorsed chest pressure/tightness and SOB. Endorsed cough that was productive of yellow sputum. No n/v/d or abdominal pain. No significant swelling or SOB with lying flat. She has been utilizing her maintenance inhaler. Feels better with cool/dry air, felt humidity/warm air in [place] exacerbated her symptoms.  Received DuoNeb x 3 and Solumedrol 125mg  IV with EMS; additional nebulizer treatments administered in ED. On ED arrival, SpO2 100% on 6L, RR 20. +Cough productive of yellow phlegm. COVID/Flu/RSV negative. Empirically started on ceftriaxone.   Pertinent Medical History:   Past Medical History:  Diagnosis Date   Anginal pain (HCC)    Anxiety    Bipolar disorder (HCC)    Cancer (HCC)    COPD (chronic  obstructive pulmonary disease) (HCC)    Dyspnea    Family history of adverse reaction to anesthesia    History of kidney stones    Hydroureteronephrosis 08/16/2021   Hypothyroidism    Lung cancer (HCC)    Myocardial infarction [facility])    PTSD (post-traumatic stress disorder)    Sleep apnea    Thyroid disease    Significant Hospital Events: Including procedures, antibiotic start and stop dates in addition to other pertinent events   5/3 - PCCM consulted for SOB/wheezing.  Interim History / Subjective:  PCCM consulted for pulmonary recommendations Met patient at bedside, sitting up eating lunch without difficulty In good spirits overall Feeling much better than prior, less chest tightness, still coughing but Tussionex has helped significantly, less productive   Objective:  Blood pressure 104/74, pulse 86, temperature 97.8 F (36.6 C), temperature source Oral, resp. rate 18, weight 42 kg, SpO2 98 %.    FiO2 (%):  [28 %] 28 %   Intake/Output Summary (Last 24 hours) at 01/10/2023 1136 Last data filed at 01/09/2023 2000 Gross per 24 hour  Intake 480 ml  Output --  Net 480 ml   Filed Weights   01/08/23 1538  Weight: 42 kg   Physical Examination: General: Chronically ill-appearing 67-year-old woman in NAD. Pleasant and conversant. HEENT: [place]/AT, anicteric sclera, PERRL, moist mucous membranes. Neuro: Awake, oriented x 4. Responds to verbal stimuli. Following commands consistently. Moves all 4 extremities spontaneously. CV: RRR, no m/g/r. PULM: Breathing even and unlabored on 2LNC. Lung fields with good air movement, diffuse expiratory wheeze L > R. Wet, productive cough. No conversational dyspnea noted. GI: Soft, nontender, nondistended. Extremities:  No LE edema noted. Skin: Warm/dry, no rashes.  Resolved Hospital Problem List:    Assessment & Plan:   67 year old woman who presented to [facility] 4/30 with SOB and wheezing. PMHx significant for COPD with acute-on-chronic hypoxemic  respiratory failure (on HOT 2L, still actively smoking. She was apparently recently hospitalized in [facility] for PNA, but we are unable to see these records/view CT imaging obtained at that time. CXR actually looks quite reassuring.  Acute-on-chronic hypoxemic respiratory failure in the setting of COPD, ongoing active smoking OSA History of lung CA  - Continue supplemental O2 support - Wean O2 for sat 88-92% (on HOT 2-3LNC) - Bronchodilators adjusted to Brovana/Yupelri + Pulmicort, albuterol PRN, continue Breztri at discharge - Pulmonary hygiene (IS/flutter, OOB/encourage mobilization) - Continue expectorant (Mucinex), cough suppressant (Tussionex) - Continue CAP coverage, ceftriaxone/azithromycin (added 5/3) - Repeat CT Chest while in-house - Will need outpatient pulm follow-up, would prefer [facility] as she previously followed with pulmonary (last seen by Dr. Randolm Idol and FNP Gery Pray) and oncological care is there - Recommend re-establishing with [facility] oncologist, now that she is back in the area  Best Practice: (right click and "Reselect all SmartList Selections" daily)   Per Primary Team  Labs:  CBC: Recent Labs  Lab 01/07/23 2212 01/08/23 0031 01/08/23 0604  WBC 10.1  --  6.7  NEUTROABS 6.1  --   --   HGB 11.8* 12.2 10.7*  HCT 38.6 36.0 35.2*  MCV 93.7  --  92.9  PLT 383  --  371   Basic Metabolic Panel: Recent Labs  Lab 01/07/23 2212 01/08/23 0031 01/08/23 0604  NA 138 138 138  K 4.0 3.9 4.4  CL 97* 97* 98  CO2 33*  --  33*  GLUCOSE 147* 223* 231*  BUN 9 9 13   CREATININE 0.63 0.40* 0.60  CALCIUM 8.5*  --  9.0   GFR: CrCl cannot be calculated (Unknown ideal weight.). Recent Labs  Lab 01/07/23 2212 01/07/23 2300 01/08/23 0024 01/08/23 0604  WBC 10.1  --   --  6.7  LATICACIDVEN  --  0.8 0.9  --    Liver Function Tests: Recent Labs  Lab 01/07/23 2212  AST 13*  ALT 15  ALKPHOS 72  BILITOT 0.1*  PROT 6.5  ALBUMIN 2.9*   No results for input(s):  "LIPASE", "AMYLASE" in the last 168 hours. No results for input(s): "AMMONIA" in the last 168 hours.  ABG:    Component Value Date/Time   PHART 7.410 05/03/2021 1029   PCO2ART 44.9 05/03/2021 1029   PO2ART 43 (L) 05/03/2021 1029   HCO3 40.3 (H) 01/07/2023 2212   TCO2 36 (H) 01/08/2023 0031   O2SAT 56.8 01/07/2023 2212    Coagulation Profile: No results for input(s): "INR", "PROTIME" in the last 168 hours.  Cardiac Enzymes: No results for input(s): "CKTOTAL", "CKMB", "CKMBINDEX", "TROPONINI" in the last 168 hours.  HbA1C: Hgb A1c MFr Bld  Date/Time Value Ref Range Status  08/20/2021 02:35 AM 5.9 (H) 4.8 - 5.6 % Final    Comment:    (NOTE) Pre diabetes:          5.7%-6.4%  Diabetes:              >6.4%  Glycemic control for   <7.0% adults with diabetes    CBG: No results for input(s): "GLUCAP" in the last 168 hours.  Review of Systems:   Review of systems completed with pertinent positives/negatives outlined in above HPI.  Past Medical History:  She,  has a past medical history of Anginal pain (HCC), Anxiety, Bipolar disorder (HCC), Cancer (HCC), COPD (chronic obstructive pulmonary disease) (HCC), Dyspnea, Family history of adverse reaction to anesthesia, History of kidney stones, Hydroureteronephrosis (08/16/2021), Hypothyroidism, Lung cancer (HCC), Myocardial infarction [facility]), PTSD (post-traumatic stress disorder), Sleep apnea, and Thyroid disease.   Surgical History:   Past Surgical History:  Procedure Laterality Date   ABDOMINAL HYSTERECTOMY     BACK SURGERY     CYSTOSCOPY W/ URETERAL STENT PLACEMENT Right 05/03/2021   Procedure: CYSTOSCOPY WITH RETROGRADE PYELOGRAM/URETERAL STENT PLACEMENT;  Surgeon: Crist Fat, MD;  Location: [facility];  Service: Urology;  Laterality: Right;   EYE SURGERY     kidney stent     thyroidectomy      Social History:   reports that she has been smoking cigarettes. She has been smoking an average of .15 packs per day. She has  never used smokeless tobacco. She reports that she does not currently use alcohol. She reports that she does not currently use drugs.   Family History:  Her Family history is unknown by patient.   Allergies: Allergies  Allergen Reactions   Red Dye Hives and Itching   Oxycontin [Oxycodone Hcl] Other (See Comments)    Hallucinations    Prednisone Hives and Nausea And Vomiting   Aspirin Hives   Tape Rash    Prefers paper tape   Wound Dressing Adhesive Rash   Home Medications: Prior to Admission medications   Medication Sig Start Date End Date Taking? Authorizing Provider  albuterol (VENTOLIN HFA) 108 (90 Base) MCG/ACT inhaler Inhale 2 puffs into the lungs every 6 (six) hours as needed for wheezing or shortness of breath.   Yes [provider]  Budeson-Glycopyrrol-Formoterol (BREZTRI AEROSPHERE) 160-9-4.8 MCG/ACT AERO Inhale 1 puff into the lungs in the morning and at bedtime.   Yes [provider]  guaiFENesin (MUCINEX) 600 MG 12 hr tablet Take 1 tablet (600 mg total) by mouth 2 (two) times daily. 03/16/22  Yes Rhetta Mura, MD  levothyroxine (SYNTHROID) 125 MCG tablet Take 125 mcg by mouth daily before breakfast.   Yes [provider]  losartan (COZAAR) 25 MG tablet Take 12.5 mg by mouth daily.   Yes [provider]  Magnesium Oxide 400 MG CAPS Take 1 capsule (400 mg total) by mouth daily at 12 noon. 03/01/22  Yes Albertine Grates, MD  nitroGLYCERIN (NITROSTAT) 0.4 MG SL tablet Place 0.4 mg under the tongue every 5 (five) minutes as needed for chest pain.   Yes [provider]  pantoprazole (PROTONIX) 40 MG tablet Take 40 mg by mouth daily.   Yes [provider]  rosuvastatin (CRESTOR) 20 MG tablet Take 20 mg by mouth daily. 01/15/20  Yes [provider]  tamsulosin (FLOMAX) 0.4 MG CAPS capsule Take 0.4 mg by mouth daily. 03/27/22  Yes [provider]  Vibegron (GEMTESA) 75 MG TABS Take 1 tablet by mouth daily.   Yes  [provider]  OXYGEN Inhale 2 L into the lungs daily as needed (for low oxygen).    [provider]    Signature:   Faythe Ghee [facility] 01/10/23 11:36 AM  Please see [email or web address] for pager details.  From 7A-7P if no response, please call [phone] After hours, please call ELink [phone]

## 2023-01-10 NOTE — Progress Notes (Signed)
PROGRESS NOTE  Tracy Park  XBJ:[id number] DOB: 10-24-1955 DOA: 01/07/2023 PCP: Jacquelin Hawking, PA-C   Brief Narrative: Patient is a 42 female with history of COPD on 2 L of oxygen at baseline, non small cell lung cancer in remission who presented to the emergency room with complaint of shortness of breath, cough.  She was recently admitted in [place] for pneumonia last month.  Patient was admitted for management of acute COPD exacerbation.  Hospital course remarkable for persistent wheezing, cough.Pulmonology consulted today.    Assessment & Plan:  Principal Problem:   COPD exacerbation (HCC) Active Problems:   Hypothyroidism (acquired)   History of lung cancer  Acute COPD exacerbation: Presented with shortness of breath, cough, found to be wheezing.  Started on IV steroids, bronchodilators.  Also on ceftriaxone COVID/flu/RSV negative. Ongoing cough, wheezing..  Continue IV steroids for today.  On Pulmicort,Continue Mucinex, tussionex, incentive spirometry. Pulmonology consulted today.  She also needs to establish care with pulmonology as an outpatient. Follow-up chest x-ray done on 5/3 did not show any significant change   Chronic hypoxic respiratory failure: Uses 2 L of oxygen at home at baseline for COPD.  Currently on the same   History of lung cancer: History of non-small cell lung cancer.  Status post surgical resection.  Currently believed to be in remission.  Follows with [facility] oncology.  We recommend to follow-up with oncology as an outpatient.  She needs to reestablish her care with her previous oncologist.   Hypothyroidism: Continue Synthroid   Protein calorie malnutrition: we requested dietitian consult.    Hypertension: Blood pressure stable/soft.  On losartan at home,on hold  Anxiety: Started on xanax      Nutrition Problem: Increased nutrient needs Etiology: chronic illness    DVT prophylaxis:enoxaparin (LOVENOX) injection 30 mg Start: 01/09/23  1400     Code Status: DNR  Family Communication: Called and discussed with daughter on phone on 5/2  Patient status:Inpatient  Patient is from :home  Anticipated discharge HY:QMVH  Estimated DC date:2-3 days   Consultants: Pulmonology  Procedures:None  Antimicrobials:  Anti-infectives (From admission, onward)    Start     Dose/Rate Route Frequency Ordered Stop   01/10/23 1400  azithromycin (ZITHROMAX) 500 mg in sodium chloride 0.9 % 250 mL IVPB        500 mg 250 mL/hr over 60 Minutes Intravenous Every 24 hours 01/10/23 1233     01/10/23 1300  azithromycin (ZITHROMAX) 250 mg in dextrose 5 % 125 mL IVPB  Status:  Discontinued        250 mg 127.5 mL/hr over 60 Minutes Intravenous Every 24 hours 01/10/23 1213 01/10/23 1233   01/08/23 0130  cefTRIAXone (ROCEPHIN) 1 g in sodium chloride 0.9 % 100 mL IVPB        1 g 200 mL/hr over 30 Minutes Intravenous Every 24 hours 01/08/23 0117 01/13/23 0129       Subjective: Patient seen and examined at bedside today.  Hemodynamically stable.  Still having a lot of cough.  Significantly wheezing.  Remains on 2 L of oxygen    Objective: Vitals:   01/09/23 2041 01/10/23 0446 01/10/23 0642 01/10/23 1252  BP: 114/61 104/74  111/75  Pulse: 96 86  87  Resp: 18 18  18   Temp: 98 F (36.7 C) 97.8 F (36.6 C)  98.1 F (36.7 C)  TempSrc: Oral Oral  Oral  SpO2: 98% 97% 98% 98%  Weight:        Intake/Output Summary (  Last 24 hours) at 01/10/2023 1319 Last data filed at 01/09/2023 2000 Gross per 24 hour  Intake 240 ml  Output --  Net 240 ml   Filed Weights   01/08/23 1538  Weight: 42 kg    Examination:  General exam: Very deconditioned, thin built, looks weak HEENT: PERRL Respiratory system: Bilateral wheezing, rhonchi, diminished air sounds Cardiovascular system: S1 & S2 heard, RRR.  Gastrointestinal system: Abdomen is nondistended, soft and nontender. Central nervous system: Alert and oriented Extremities: No edema, no  clubbing ,no cyanosis Skin: No rashes, no ulcers,no icterus      Data Reviewed: I have personally reviewed following labs and imaging studies  CBC: Recent Labs  Lab 01/07/23 2212 01/08/23 0031 01/08/23 0604  WBC 10.1  --  6.7  NEUTROABS 6.1  --   --   HGB 11.8* 12.2 10.7*  HCT 38.6 36.0 35.2*  MCV 93.7  --  92.9  PLT 383  --  371   Basic Metabolic Panel: Recent Labs  Lab 01/07/23 2212 01/08/23 0031 01/08/23 0604  NA 138 138 138  K 4.0 3.9 4.4  CL 97* 97* 98  CO2 33*  --  33*  GLUCOSE 147* 223* 231*  BUN 9 9 13   CREATININE 0.63 0.40* 0.60  CALCIUM 8.5*  --  9.0     Recent Results (from the past 240 hour(s))  Resp panel by RT-PCR (RSV, Flu A&B, Covid) Peripheral     Status: None   Collection Time: 01/07/23 11:00 PM   Specimen: Peripheral; Nasal Swab  Result Value Ref Range Status   SARS Coronavirus 2 by RT PCR NEGATIVE NEGATIVE Final    Comment: (NOTE) SARS-CoV-2 target nucleic acids are NOT DETECTED.  The SARS-CoV-2 RNA is generally detectable in upper respiratory specimens during the acute phase of infection. The lowest concentration of SARS-CoV-2 viral copies this assay can detect is 138 copies/mL. A negative result does not preclude SARS-Cov-2 infection and should not be used as the sole basis for treatment or other patient management decisions. A negative result may occur with  improper specimen collection/handling, submission of specimen other than nasopharyngeal swab, presence of viral mutation(s) within the areas targeted by this assay, and inadequate number of viral copies(<138 copies/mL). A negative result must be combined with clinical observations, patient history, and epidemiological information. The expected result is Negative.  Fact Sheet for Patients:  BloggerCourse.com  Fact Sheet for Healthcare Providers:  SeriousBroker.it  This test is no t yet approved or cleared by the [place] FDA  and  has been authorized for detection and/or diagnosis of SARS-CoV-2 by FDA under an Emergency Use Authorization (EUA). This EUA will remain  in effect (meaning this test can be used) for the duration of the COVID-19 declaration under Section 564(b)(1) of the Act, 21 U.S.C.section 360bbb-3(b)(1), unless the authorization is terminated  or revoked sooner.       Influenza A by PCR NEGATIVE NEGATIVE Final   Influenza B by PCR NEGATIVE NEGATIVE Final    Comment: (NOTE) The Xpert Xpress SARS-CoV-2/FLU/RSV plus assay is intended as an aid in the diagnosis of influenza from Nasopharyngeal swab specimens and should not be used as a sole basis for treatment. Nasal washings and aspirates are unacceptable for Xpert Xpress SARS-CoV-2/FLU/RSV testing.  Fact Sheet for Patients: BloggerCourse.com  Fact Sheet for Healthcare Providers: SeriousBroker.it  This test is not yet approved or cleared by the [place] FDA and has been authorized for detection and/or diagnosis of SARS-CoV-2 by FDA under  an Emergency Use Authorization (EUA). This EUA will remain in effect (meaning this test can be used) for the duration of the COVID-19 declaration under Section 564(b)(1) of the Act, 21 U.S.C. section 360bbb-3(b)(1), unless the authorization is terminated or revoked.     Resp Syncytial Virus by PCR NEGATIVE NEGATIVE Final    Comment: (NOTE) Fact Sheet for Patients: BloggerCourse.com  Fact Sheet for Healthcare Providers: SeriousBroker.it  This test is not yet approved or cleared by the [place] FDA and has been authorized for detection and/or diagnosis of SARS-CoV-2 by FDA under an Emergency Use Authorization (EUA). This EUA will remain in effect (meaning this test can be used) for the duration of the COVID-19 declaration under Section 564(b)(1) of the Act, 21 U.S.C. section 360bbb-3(b)(1),  unless the authorization is terminated or revoked.  Performed at [facility], [address] [place], [place] [address]   Culture, blood (routine x 2)     Status: None (Preliminary result)   Collection Time: 01/07/23 11:00 PM   Specimen: BLOOD  Result Value Ref Range Status   Specimen Description   Final    BLOOD BLOOD LEFT HAND Performed at [facility], [address], [place], [place] [address]    Special Requests   Final    BOTTLES DRAWN AEROBIC AND ANAEROBIC Blood Culture adequate volume Performed at [facility], [address], [place], [place] [address]    Culture   Final    NO GROWTH 2 DAYS Performed at [facility], [address], [place], [place] [address]    Report Status PENDING  Incomplete  Culture, blood (routine x 2)     Status: None (Preliminary result)   Collection Time: 01/07/23 11:50 PM   Specimen: BLOOD  Result Value Ref Range Status   Specimen Description   Final    BLOOD RIGHT ANTECUBITAL Performed at [facility], [address] [place], [place] [address]    Special Requests   Final    BOTTLES DRAWN AEROBIC AND ANAEROBIC Blood Culture adequate volume Performed at [facility], [address] [place], [place] [address]    Culture   Final    NO GROWTH 2 DAYS Performed at [facility], [address] [place], [place] [address]    Report Status PENDING  Incomplete     Radiology Studies: DG CHEST PORT 1 VIEW  Result Date: 01/10/2023 CLINICAL DATA:  Wheezing EXAM: PORTABLE CHEST 1 VIEW COMPARISON:  01/07/2023 and older FINDINGS: Once again volume loss along the left hemithorax from prior resection. Apical pleural thickening and retraction. Shift of the mediastinum from right to left. Right lung is clear. Hyperinflated. No consolidation or pneumothorax. No edema. Normal cardiopericardial silhouette IMPRESSION: Stable postsurgical changes along  the left hemithorax with volume loss and apical pleural thickening. Hyperinflation Electronically Signed   By: Karen Kays M.D.   On: 01/10/2023 12:22    Scheduled Meds:  albuterol  2.5 mg Nebulization TID   arformoterol  15 mcg Nebulization BID   budesonide (PULMICORT) nebulizer solution  0.5 mg Nebulization BID   chlorpheniramine-HYDROcodone  5 mL Oral Q12H   enoxaparin (LOVENOX) injection  30 mg Subcutaneous Q24H   feeding supplement  237 mL Oral BID BM   guaiFENesin  1,200 mg Oral BID   levothyroxine  125 mcg Oral Q0600   magnesium oxide  400 mg Oral Q1200   methylPREDNISolone (SOLU-MEDROL) injection  40 mg Intravenous Q12H  mirabegron ER  25 mg Oral Daily   pantoprazole  40 mg Oral Daily   revefenacin  175 mcg Nebulization Daily   rosuvastatin  20 mg Oral Daily   sodium chloride HYPERTONIC  4 mL Nebulization Daily   tamsulosin  0.4 mg Oral Daily   Continuous Infusions:  azithromycin     cefTRIAXone (ROCEPHIN)  IV 1 g (01/10/23 0123)     LOS: 1 day   Burnadette Pop, MD [facility] P5/11/2022, 1:19 PM

## 2023-01-10 NOTE — TOC Initial Note (Signed)
Transition of Care [facility]) - Initial/Assessment Note    Patient Details  Name: Tracy Park MRN: [id number] Date of Birth: 1956-06-11  [facility]) CM/SW Contact:    Beckie Busing, RN Phone Number:[phone]   01/10/2023, 11:36 AM  Clinical Narrative:                 [facility] consulted for patient with home health recommendations. CM at bedside introduces self and explained home health recommendation. Patient states that she has recently moved from [place] and is living with her daughter and her husband. Patient states that she does not want home health PT in the home and would prefer to go outpatient and use transportation services provided through her insurance. Outpatient referral has been sent and info added to avs    Barriers to Discharge: Continued Medical Work up   Patient Goals and CMS Choice Patient states their goals for this hospitalization and ongoing recovery are:: Wants to get betterto go home CMS Medicare.gov Compare Post Acute Care list provided to:: Patient Choice offered to / list presented to : Patient [place] ownership interest in [facility].provided to::  (n/a)    Expected Discharge Plan and Services In-house Referral: NA Discharge Planning Services: CM Consult, Other - See comment (outpatient PT) Post Acute Care Choice: Home Health Living arrangements for the past 2 months: Single Family Home                 DME Arranged: N/A DME Agency: NA       HH Arranged: Refused HH (Patient is open to outpatient therapy)          Prior Living Arrangements/Services Living arrangements for the past 2 months: Single Family Home Lives with:: Adult Children Patient language and need for interpreter reviewed:: Yes Do you feel safe going back to the place where you live?: Yes      Need for Family Participation in Patient Care: No (Comment) Care giver support system in place?: Yes (comment) Current home services:  (n/a) Criminal  Activity/Legal Involvement Pertinent to Current Situation/Hospitalization: No - Comment as needed  Activities of Daily Living Home Assistive Devices/Equipment: None ADL Screening (condition at time of admission) Patient's cognitive ability adequate to safely complete daily activities?: Yes Is the patient deaf or have difficulty hearing?: No Does the patient have difficulty seeing, even when wearing glasses/contacts?: No Does the patient have difficulty concentrating, remembering, or making decisions?: No Patient able to express need for assistance with ADLs?: Yes Does the patient have difficulty dressing or bathing?: No Independently performs ADLs?: Yes (appropriate for developmental age) Does the patient have difficulty walking or climbing stairs?: Yes Weakness of Legs: None Weakness of Arms/Hands: None  Permission Sought/Granted Permission sought to share information with : Family Supports Permission granted to share information with : No              Emotional Assessment Appearance:: Appears stated age Attitude/Demeanor/Rapport: Gracious Affect (typically observed): Pleasant Orientation: : Oriented to Self, Oriented to Place, Oriented to  Time, Oriented to Situation Alcohol / Substance Use: Not Applicable Psych Involvement: No (comment)  Admission diagnosis:  COPD exacerbation (HCC) [J44.1] Patient Active Problem List   Diagnosis Date Noted   Thrombocytosis 03/26/2022   COPD exacerbation (HCC) 03/25/2022   DDD (degenerative disc disease), cervical 03/24/2022   Bipolar 2 disorder (HCC) 03/24/2022   Septic shock (HCC) 03/14/2022   Sepsis due to pneumonia (HCC) 03/13/2022   Dyslipidemia 03/13/2022   Anxiety and depression  03/13/2022   History of lung cancer 03/13/2022   Tobacco abuse 03/13/2022   Pneumonia 02/27/2022   Adenocarcinoma of lung (HCC) 02/26/2022   Leukocytosis 02/26/2022   COPD with acute exacerbation (HCC) 02/26/2022   Chronic respiratory failure with  hypoxia (HCC) 02/26/2022   Normocytic anemia 02/08/2022   Protein-calorie malnutrition, severe 08/25/2021   Influenza A with pneumonia 08/16/2021   Sepsis (HCC) 08/16/2021   Hyponatremia 08/16/2021   Hypokalemia 08/16/2021   Hypomagnesemia 08/16/2021   Hydronephrosis 05/03/2021   Acute encephalopathy 05/02/2021   CAP (community acquired pneumonia) 04/10/2020   Coronary artery disease involving native coronary artery of native heart without angina pectoris 08/18/2018   Chronic bilateral low back pain without sciatica 08/21/2016   Hyperlipidemia with target LDL less than 100 06/03/2013   GERD (gastroesophageal reflux disease) 04/28/2012   Hypothyroidism (acquired) 04/28/2012   Nicotine abuse 04/28/2012   Depression 04/28/2012   Chest pain, atypical 04/28/2012   PCP:  Jacquelin Hawking, PA-C Pharmacy:   [facility] [phone] - [address] [place] - [name] [place] [address] Phone: [phone] Fax: [phone]  [facility] [place] [facility] [address] [place] [address] Phone: [phone] Fax: [phone]  [id number] - [place], [place] - [address] AT [facility] OF [address] [place] [address] Phone: [phone] Fax: [phone]  [id number] [place], [place] - [address] AT [facility] & [address] [place] [address] Phone: [phone] Fax: [phone]     Social Determinants of Health (SDOH) Social History: SDOH Screenings   Food Insecurity: No Food Insecurity (01/08/2023)  Housing: Low Risk  (01/08/2023)  Transportation Needs: No Transportation Needs (01/08/2023)  Utilities: Not At Risk (01/08/2023)  Tobacco Use: High Risk (01/07/2023)   SDOH Interventions:     Readmission Risk Interventions    01/10/2023   11:16 AM 03/01/2022    9:00 AM  Readmission Risk Prevention Plan  Transportation  Screening Complete Complete  PCP or Specialist Appt within 5-7 Days Complete Complete  Home Care Screening Complete Complete  Medication Review (RN CM) Complete Complete

## 2023-01-10 NOTE — Progress Notes (Signed)
Mobility Specialist - Progress Note   01/10/23 1032  Mobility  Activity Ambulated independently in hallway  Level of Assistance Independent  Assistive Device None  Distance Ambulated (ft) 500 ft  Activity Response Tolerated well  Mobility Referral Yes  $Mobility charge 1 Mobility   Pt received in bed and agreeable to mobility. Pt had several coughing spells throughout session. No complaints during session. Pt to bed after session with all needs met.    [facility]

## 2023-01-10 NOTE — Progress Notes (Signed)
Occupational Therapy Treatment Patient Details Name: Tracy Park MRN: [id number] DOB: 12-25-1955 Today's Date: 01/10/2023   History of present illness 67 yo medically complex female admitted to hospital on 01/07/2023 due to wheezing, SOB with productive cough, L side chest pain and hypoxia on 2 L/min.  Pt was recently hospitalized in [place] for PNA. Pt has PMH including but not limited to: angina, anxiety, bipolar disorder, lung cancer, COPD, supplemental O2, OSA, MI, PTSD and back sx.   OT comments  OT saw the pt to provide education on implementing energy conservation strategies as needed during daily activities, given the pt's endurance limitations and chronic O2 use. She was educated on such things as performing seated ADLs, taking rest breaks as needed, and performing tasks to tolerance. An educational handout was given to the pt & he verbalized understanding. She has met her therapy goals and does not require further OT services. OT will sign off & recommend she return home at discharge.    Recommendations for follow up therapy are one component of a multi-disciplinary discharge planning process, led by the attending physician.  Recommendations may be updated based on patient status, additional functional criteria and insurance authorization.    Assistance Recommended at Discharge PRN  Patient can return home with the following  Assistance with cooking/housework         Precautions / Restrictions Precautions Precaution Comments: typically 2L O2 baseline Restrictions Weight Bearing Restrictions: No              ADL either performed or assessed with clinical judgement   ADL Overall ADL's : At baseline                Cognition Arousal/Alertness: Awake/alert Behavior During Therapy: WFL for tasks assessed/performed Overall Cognitive Status: Within Functional Limits for tasks assessed        General Comments: Cooperative, able to follow commands, Oriented                  Frequency   (N/A)        Progress Toward Goals  OT Goals(current goals can now be found in the care plan section)  Progress towards OT goals: Goals met/education completed, patient discharged from OT  Acute Rehab OT Goals Patient Stated Goal: to get better and return home soon OT Goal Formulation: All assessment and education complete, DC therapy  Plan All goals met and education completed, patient discharged from OT services       AM-PAC OT "6 Clicks" Daily Activity     Outcome Measure   Help from another person eating meals?: None Help from another person taking care of personal grooming?: None Help from another person toileting, which includes using toliet, bedpan, or urinal?: None Help from another person bathing (including washing, rinsing, drying)?: None Help from another person to put on and taking off regular upper body clothing?: None Help from another person to put on and taking off regular lower body clothing?: None 6 Click Score: 24    End of Session Equipment Utilized During Treatment: Oxygen  OT Visit Diagnosis: Muscle weakness (generalized) (M62.81)   Activity Tolerance Patient tolerated treatment well   Patient Left in bed;with call bell/phone within reach   Nurse Communication Mobility status        Time: 4098-1191 OT Time Calculation (min): 18 min  Charges: OT General Charges $OT Visit: 1 Visit OT Treatments $Self Care/Home Management : 8-22 mins     Reuben Likes, OTR/L 01/10/2023, 5:33 PM

## 2023-01-11 DIAGNOSIS — J441 Chronic obstructive pulmonary disease with (acute) exacerbation: Secondary | ICD-10-CM | POA: Diagnosis not present

## 2023-01-11 LAB — BASIC METABOLIC PANEL
Anion gap: 6 (ref 5–15)
BUN: 23 mg/dL (ref 8–23)
CO2: 33 mmol/L — ABNORMAL HIGH (ref 22–32)
Calcium: 8.4 mg/dL — ABNORMAL LOW (ref 8.9–10.3)
Chloride: 98 mmol/L (ref 98–111)
Creatinine, Ser: 0.56 mg/dL (ref 0.44–1.00)
GFR, Estimated: >60 mL/min (ref >60)
Glucose, Bld: 148 mg/dL — ABNORMAL HIGH (ref 70–99)
Potassium: 3.8 mmol/L (ref 3.5–5.1)
Sodium: 137 mmol/L (ref 135–145)

## 2023-01-11 LAB — CBC
HCT: 31.9 % — ABNORMAL LOW (ref 36.0–46.0)
Hemoglobin: 9.8 g/dL — ABNORMAL LOW (ref 12.0–15.0)
MCH: 28.7 pg (ref 26.0–34.0)
MCHC: 30.7 g/dL (ref 30.0–36.0)
MCV: 93.5 fL (ref 80.0–100.0)
Platelets: 359 10*3/uL (ref 150–400)
RBC: 3.41 MIL/uL — ABNORMAL LOW (ref 3.87–5.11)
RDW: 15 % (ref 11.5–15.5)
WBC: 11.3 10*3/uL — ABNORMAL HIGH (ref 4.0–10.5)
nRBC: 0 % (ref 0.0–0.2)

## 2023-01-11 LAB — CULTURE, BLOOD (ROUTINE X 2)

## 2023-01-11 NOTE — Progress Notes (Signed)
NAME:  Tracy Park, MRN:  [id number], DOB:  Dec 13, 1955, LOS: 2 ADMISSION DATE:  01/07/2023 CONSULTATION DATE:  01/10/2023 REFERRING MD:  Renford Dills - [facility] CHIEF COMPLAINT:  SOB, wheezing   History of Present Illness:  Tracy Park is seen in consultation at the request of Dr. Renford Dills [facility]) for recommendations on further evaluation and management of SOB/wheezing associated with COPD/chronic hypoxemic respiratory failure.   67 year old woman who presented to [facility] 4/30 with SOB, wheezing and productive cough. PMHx significant for COPD with acute-on-chronic hypoxemic respiratory failure (on HOT 2L, still actively smoking), OSA, NSCLC (in remission, s/p left upper lobectomy/XRT with [facility]), hypothyroidism, MI, bipolar disorder/anxiety, PTSD.   Recently moved back to the [place] area to be closer to family. Her husband passed in 2022 and she temporarily relocated to [facility]. Recent admission in [place] for PNA, she was treated with oral antibiotics and prednisone. She states that she did not feel back to her baseline prior to leaving the hospital in [facility]; ultimately felt much more poorly on date of admission requiring increased O2 and EMS call for transfer to hospital. At that time, denied fever/chills, endorsed chest pressure/tightness and SOB. Endorsed cough that was productive of yellow sputum. No n/v/d or abdominal pain. No significant swelling or SOB with lying flat. She has been utilizing her maintenance inhaler. Feels better with cool/dry air, felt humidity/warm air in [place] exacerbated her symptoms.  Received DuoNeb x 3 and Solumedrol 125mg  IV with EMS; additional nebulizer treatments administered in ED. On ED arrival, SpO2 100% on 6L, RR 20. +Cough productive of yellow phlegm. COVID/Flu/RSV negative. Empirically started on ceftriaxone.   Pertinent Medical History:   Past Medical History:  Diagnosis Date   Anginal pain (HCC)    Anxiety    Bipolar disorder (HCC)    Cancer (HCC)    COPD (chronic  obstructive pulmonary disease) (HCC)    Dyspnea    Family history of adverse reaction to anesthesia    History of kidney stones    Hydroureteronephrosis 08/16/2021   Hypothyroidism    Lung cancer (HCC)    Myocardial infarction [facility])    PTSD (post-traumatic stress disorder)    Sleep apnea    Thyroid disease    Significant Hospital Events: Including procedures, antibiotic start and stop dates in addition to other pertinent events   5/3 - PCCM consulted for SOB/wheezing.  Interim History / Subjective:   Feels better.  Objective:  Blood pressure 115/76, pulse 87, temperature (!) 97.5 F (36.4 C), temperature source Oral, resp. rate 18, weight 42 kg, SpO2 98 %.        Intake/Output Summary (Last 24 hours) at 01/11/2023 1015 Last data filed at 01/11/2023 0934 Gross per 24 hour  Intake 480 ml  Output --  Net 480 ml   Filed Weights   01/08/23 1538  Weight: 42 kg   Physical Examination: Blood pressure 115/76, pulse 87, temperature (!) 97.5 F (36.4 C), temperature source Oral, resp. rate 18, weight 42 kg, SpO2 98 %. Gen:      No acute distress HEENT:  EOMI, sclera anicteric Neck:     No masses; no thyromegaly Lungs:    Bilateral expiratory wheeze CV:         Regular rate and rhythm; no murmurs Abd:      + bowel sounds; soft, non-tender; no palpable masses, no distension Ext:    No edema; adequate peripheral perfusion Skin:      Warm and dry; no rash Neuro: alert and oriented  x 3 Psych: normal mood and affect   CT chest from 5/3 reviewed with chronic postsurgical changes, 7 mm nodule is slightly increased.  Mucus in the right lower lobe bronchi.   Resolved Hospital Problem List:    Assessment & Plan:   67 year old woman who presented to [facility] 4/30 with SOB and wheezing. PMHx significant for COPD with acute-on-chronic hypoxemic respiratory failure (on HOT 2L, still actively smoking. She was apparently recently hospitalized in [facility] for PNA, but we are unable to see these  records/view CT imaging obtained at that time. CXR actually looks quite reassuring.  Acute-on-chronic hypoxemic respiratory failure in the setting of COPD, ongoing active smoking OSA History of lung CA  - Continue supplemental O2 support - Wean O2 for sat 88-92% (on HOT 2-3LNC) - Bronchodilators adjusted to Brovana/Yupelri + Pulmicort, albuterol PRN, continue Breztri at discharge - Pulmonary hygiene (IS/flutter, OOB/encourage mobilization) - Continue expectorant (Mucinex), cough suppressant (Tussionex) - Continue CAP coverage, ceftriaxone/azithromycin (added 5/3) - Will need outpatient pulm follow-up, she previously followed with Dr. Randolm Idol from [place] but would prefer to establish care at [facility] pulmonary  Lung nodule Discussed with patient.  She will need a 37-month follow-up scan  Will check back on Monday 5/6  Best Practice: (right click and "Reselect all SmartList Selections" daily)   Per Primary Team  Signature:   Chilton Greathouse, MD [facility] 01/11/23 10:15 AM  Please see [email or web address] for pager details.  From 7A-7P if no response, please call [phone] After hours, please call ELink [phone]

## 2023-01-11 NOTE — Progress Notes (Signed)
PROGRESS NOTE  Tracy Park  GNF:[id number] DOB: Nov 04, 1955 DOA: 01/07/2023 PCP: Jacquelin Hawking, PA-C   Brief Narrative: Patient is a 77 female with history of COPD on 2 L of oxygen at baseline, non small cell lung cancer in remission who presented to the emergency room with complaint of shortness of breath, cough.  She was recently admitted in [place] for pneumonia last month.  Patient was admitted for management of acute COPD exacerbation.  Hospital course remarkable for persistent wheezing, cough.Pulmonology consulted.    Assessment & Plan:  Principal Problem:   COPD exacerbation (HCC) Active Problems:   Hypothyroidism (acquired)   History of lung cancer  Acute COPD exacerbation: Presented with shortness of breath, cough, found to be wheezing.  Started on IV steroids, bronchodilators.  Also on ceftriaxone and azithro COVID/flu/RSV negative. Ongoing cough, wheezing. Continue IV steroids  today.  On Pulmicort,Continue Mucinex, tussionex, incentive spirometry. Pulmonology consulted.  She also needs to establish care with pulmonology as an outpatient. Follow-up chest x-ray done on 5/3 did not show any significant change.   Chronic hypoxic respiratory failure: Uses 2 L of oxygen at home at baseline for COPD.  Currently on the same   History of lung cancer: History of non-small cell lung cancer.  Status post surgical resection.  Currently believed to be in remission.  Follows with [facility] oncology.  We recommend to follow-up with oncology as an outpatient.  She needs to reestablish her care with her previous oncologist. Follow-up CT scan done on 5/3 showed 7 mm right upper pulmonary nodule.  Needs follow-up in 3 to 6 months with noncontrast CT.  No signs of recurrence of lung cancer   Hypothyroidism: Continue Synthroid   Protein calorie malnutrition: we requested dietitian consult.    Hypertension: Blood pressure stable/soft.  On losartan at home,on hold  Anxiety: Started on  xanax  Weakness: Patient seen by PT and recommended home health on discharge      Nutrition Problem: Increased nutrient needs Etiology: chronic illness    DVT prophylaxis:enoxaparin (LOVENOX) injection 30 mg Start: 01/09/23 1400     Code Status: DNR  Family Communication: Called and discussed with daughter on phone on 5/2  Patient status:Inpatient  Patient is from :home  Anticipated discharge QI:ONGE  Estimated DC date:2-3 days   Consultants: Pulmonology  Procedures:None  Antimicrobials:  Anti-infectives (From admission, onward)    Start     Dose/Rate Route Frequency Ordered Stop   01/10/23 1400  azithromycin (ZITHROMAX) 500 mg in sodium chloride 0.9 % 250 mL IVPB        500 mg 250 mL/hr over 60 Minutes Intravenous Every 24 hours 01/10/23 1233     01/10/23 1300  azithromycin (ZITHROMAX) 250 mg in dextrose 5 % 125 mL IVPB  Status:  Discontinued        250 mg 127.5 mL/hr over 60 Minutes Intravenous Every 24 hours 01/10/23 1213 01/10/23 1233   01/08/23 0130  cefTRIAXone (ROCEPHIN) 1 g in sodium chloride 0.9 % 100 mL IVPB        1 g 200 mL/hr over 30 Minutes Intravenous Every 24 hours 01/08/23 0117 01/13/23 0129       Subjective: Patient seen and examined at bedside today.  Hemodynamically stable.  Still coughing persistently.  Looks weak and deconditioned.  Wheezing might have slightly improved today.  Afebrile.   Objective: Vitals:   01/10/23 1252 01/10/23 1933 01/10/23 2011 01/11/23 0449  BP: 111/75  125/87 115/76  Pulse: 87  89 87  Resp: 18  18 18   Temp: 98.1 F (36.7 C)  98.7 F (37.1 C) (!) 97.5 F (36.4 C)  TempSrc: Oral  Oral Oral  SpO2: 98% 96% 97% 98%  Weight:        Intake/Output Summary (Last 24 hours) at 01/11/2023 1147 Last data filed at 01/11/2023 0934 Gross per 24 hour  Intake 480 ml  Output --  Net 480 ml   Filed Weights   01/08/23 1538  Weight: 42 kg    Examination:  General exam: Very weak, deconditioned, thin built HEENT:  PERRL Respiratory system: Diminished sounds bilaterally, wheezing  cardiovascular system: S1 & S2 heard, RRR.  Gastrointestinal system: Abdomen is nondistended, soft and nontender. Central nervous system: Alert and oriented Extremities: No edema, no clubbing ,no cyanosis Skin: No rashes, no ulcers,no icterus     Data Reviewed: I have personally reviewed following labs and imaging studies  CBC: Recent Labs  Lab 01/07/23 2212 01/08/23 0031 01/08/23 0604 01/11/23 0723  WBC 10.1  --  6.7 11.3*  NEUTROABS 6.1  --   --   --   HGB 11.8* 12.2 10.7* 9.8*  HCT 38.6 36.0 35.2* 31.9*  MCV 93.7  --  92.9 93.5  PLT 383  --  371 359   Basic Metabolic Panel: Recent Labs  Lab 01/07/23 2212 01/08/23 0031 01/08/23 0604 01/11/23 0723  NA 138 138 138 137  K 4.0 3.9 4.4 3.8  CL 97* 97* 98 98  CO2 33*  --  33* 33*  GLUCOSE 147* 223* 231* 148*  BUN 9 9 13 23   CREATININE 0.63 0.40* 0.60 0.56  CALCIUM 8.5*  --  9.0 8.4*     Recent Results (from the past 240 hour(s))  Resp panel by RT-PCR (RSV, Flu A&B, Covid) Peripheral     Status: None   Collection Time: 01/07/23 11:00 PM   Specimen: Peripheral; Nasal Swab  Result Value Ref Range Status   SARS Coronavirus 2 by RT PCR NEGATIVE NEGATIVE Final    Comment: (NOTE) SARS-CoV-2 target nucleic acids are NOT DETECTED.  The SARS-CoV-2 RNA is generally detectable in upper respiratory specimens during the acute phase of infection. The lowest concentration of SARS-CoV-2 viral copies this assay can detect is 138 copies/mL. A negative result does not preclude SARS-Cov-2 infection and should not be used as the sole basis for treatment or other patient management decisions. A negative result may occur with  improper specimen collection/handling, submission of specimen other than nasopharyngeal swab, presence of viral mutation(s) within the areas targeted by this assay, and inadequate number of viral copies(<138 copies/mL). A negative result must be  combined with clinical observations, patient history, and epidemiological information. The expected result is Negative.  Fact Sheet for Patients:  BloggerCourse.com  Fact Sheet for Healthcare Providers:  SeriousBroker.it  This test is no t yet approved or cleared by the [place] FDA and  has been authorized for detection and/or diagnosis of SARS-CoV-2 by FDA under an Emergency Use Authorization (EUA). This EUA will remain  in effect (meaning this test can be used) for the duration of the COVID-19 declaration under Section 564(b)(1) of the Act, 21 U.S.C.section 360bbb-3(b)(1), unless the authorization is terminated  or revoked sooner.       Influenza A by PCR NEGATIVE NEGATIVE Final   Influenza B by PCR NEGATIVE NEGATIVE Final    Comment: (NOTE) The Xpert Xpress SARS-CoV-2/FLU/RSV plus assay is intended as an aid in the diagnosis of influenza from Nasopharyngeal swab specimens and  should not be used as a sole basis for treatment. Nasal washings and aspirates are unacceptable for Xpert Xpress SARS-CoV-2/FLU/RSV testing.  Fact Sheet for Patients: BloggerCourse.com  Fact Sheet for Healthcare Providers: SeriousBroker.it  This test is not yet approved or cleared by the [place] FDA and has been authorized for detection and/or diagnosis of SARS-CoV-2 by FDA under an Emergency Use Authorization (EUA). This EUA will remain in effect (meaning this test can be used) for the duration of the COVID-19 declaration under Section 564(b)(1) of the Act, 21 U.S.C. section 360bbb-3(b)(1), unless the authorization is terminated or revoked.     Resp Syncytial Virus by PCR NEGATIVE NEGATIVE Final    Comment: (NOTE) Fact Sheet for Patients: BloggerCourse.com  Fact Sheet for Healthcare Providers: SeriousBroker.it  This test is not yet  approved or cleared by the [place] FDA and has been authorized for detection and/or diagnosis of SARS-CoV-2 by FDA under an Emergency Use Authorization (EUA). This EUA will remain in effect (meaning this test can be used) for the duration of the COVID-19 declaration under Section 564(b)(1) of the Act, 21 U.S.C. section 360bbb-3(b)(1), unless the authorization is terminated or revoked.  Performed at [facility], [address], [place], [place] [address]   Culture, blood (routine x 2)     Status: None (Preliminary result)   Collection Time: 01/07/23 11:00 PM   Specimen: BLOOD  Result Value Ref Range Status   Specimen Description   Final    BLOOD BLOOD LEFT HAND Performed at [facility], [address], [place], [place] [address]    Special Requests   Final    BOTTLES DRAWN AEROBIC AND ANAEROBIC Blood Culture adequate volume Performed at [facility], [address], [place], [place] [address]    Culture   Final    NO GROWTH 3 DAYS Performed at [facility], [address], [place], [place] [address]    Report Status PENDING  Incomplete  Culture, blood (routine x 2)     Status: None (Preliminary result)   Collection Time: 01/07/23 11:50 PM   Specimen: BLOOD  Result Value Ref Range Status   Specimen Description   Final    BLOOD RIGHT ANTECUBITAL Performed at [facility], [address], [place], [place] [address]    Special Requests   Final    BOTTLES DRAWN AEROBIC AND ANAEROBIC Blood Culture adequate volume Performed at [facility], [address], [place], [place] [address]    Culture   Final    NO GROWTH 3 DAYS Performed at [facility], [address], [place], [place] [address]    Report Status PENDING  Incomplete     Radiology Studies: CT CHEST WO CONTRAST  Result Date: 01/10/2023 CLINICAL DATA:  Non-small cell lung cancer. Status post left upper  lobectomy. EXAM: CT CHEST WITHOUT CONTRAST TECHNIQUE: Multidetector CT imaging of the chest was performed following the standard protocol without IV contrast. RADIATION DOSE REDUCTION: This exam was performed according to the departmental dose-optimization program which includes automated exposure control, adjustment of the mA and/or kV according to patient size and/or use of iterative reconstruction technique. COMPARISON:  CT chest abdomen and pelvis 03/30/2022 FINDINGS: Cardiovascular: No significant vascular findings. Normal heart size. No pericardial effusion. There are atherosclerotic calcifications is of the aorta. Mediastinum/Nodes: No enlarged mediastinal or axillary lymph nodes. Thyroid gland, trachea, and esophagus demonstrate no significant findings. Lungs/Pleura: Moderate emphysema is again seen. Left  upper lobectomy changes are again seen with pleural thickening in the left lung apex. Previously identified left upper lung airspace disease near the apex has resolved in the interval. There is no new focal lung infiltrate. Scattered areas of scarring or atelectasis are stable. There is some plugging of right lower lobe bronchi, new from prior. Patchy slightly nodular density in the posterior right upper lobe measures 7 mm (previously 5 mm). No new pulmonary nodules are seen. No pleural effusion. Upper Abdomen: No acute abnormality. Musculoskeletal: No chest wall mass or suspicious bone lesions identified. Cervical disc spacer present. IMPRESSION: 1. Left upper lobectomy changes. No evidence for local recurrence. 2. 7 mm right upper lobe pulmonary nodule has slightly increased in size when compared to prior. Continued close interval follow-up recommended. Non-contrast chest CT in 3-6 months is suggested. 3. New plugging of right lower lobe bronchi, likely infectious/inflammatory. Aortic Atherosclerosis (ICD10-I70.0) and Emphysema (ICD10-J43.9). Electronically Signed   By: Darliss Cheney M.D.   On: 01/10/2023  19:26   DG CHEST PORT 1 VIEW  Result Date: 01/10/2023 CLINICAL DATA:  Wheezing EXAM: PORTABLE CHEST 1 VIEW COMPARISON:  01/07/2023 and older FINDINGS: Once again volume loss along the left hemithorax from prior resection. Apical pleural thickening and retraction. Shift of the mediastinum from right to left. Right lung is clear. Hyperinflated. No consolidation or pneumothorax. No edema. Normal cardiopericardial silhouette IMPRESSION: Stable postsurgical changes along the left hemithorax with volume loss and apical pleural thickening. Hyperinflation Electronically Signed   By: Karen Kays M.D.   On: 01/10/2023 12:22    Scheduled Meds:  albuterol  2.5 mg Nebulization TID   arformoterol  15 mcg Nebulization BID   budesonide (PULMICORT) nebulizer solution  0.5 mg Nebulization BID   chlorpheniramine-HYDROcodone  5 mL Oral Q12H   enoxaparin (LOVENOX) injection  30 mg Subcutaneous Q24H   feeding supplement  237 mL Oral BID BM   guaiFENesin  1,200 mg Oral BID   levothyroxine  125 mcg Oral Q0600   magnesium oxide  400 mg Oral Q1200   methylPREDNISolone (SOLU-MEDROL) injection  40 mg Intravenous Q12H   mirabegron ER  25 mg Oral Daily   pantoprazole  40 mg Oral Daily   revefenacin  175 mcg Nebulization Daily   rosuvastatin  20 mg Oral Daily   tamsulosin  0.4 mg Oral Daily   Continuous Infusions:  azithromycin Stopped (01/10/23 1442)   cefTRIAXone (ROCEPHIN)  IV 1 g (01/11/23 0146)     LOS: 2 days   Burnadette Pop, MD [facility] P5/12/2022, 11:47 AM

## 2023-01-12 DIAGNOSIS — J441 Chronic obstructive pulmonary disease with (acute) exacerbation: Secondary | ICD-10-CM | POA: Diagnosis not present

## 2023-01-12 LAB — CBC
HCT: 34.5 % — ABNORMAL LOW (ref 36.0–46.0)
Hemoglobin: 10.5 g/dL — ABNORMAL LOW (ref 12.0–15.0)
MCH: 28.4 pg (ref 26.0–34.0)
MCHC: 30.4 g/dL (ref 30.0–36.0)
MCV: 93.2 fL (ref 80.0–100.0)
Platelets: 468 10*3/uL — ABNORMAL HIGH (ref 150–400)
RBC: 3.7 MIL/uL — ABNORMAL LOW (ref 3.87–5.11)
RDW: 14.8 % (ref 11.5–15.5)
WBC: 13.6 10*3/uL — ABNORMAL HIGH (ref 4.0–10.5)
nRBC: 0 % (ref 0.0–0.2)

## 2023-01-12 LAB — CULTURE, BLOOD (ROUTINE X 2)
Culture: NO GROWTH
Special Requests: ADEQUATE

## 2023-01-12 MED ORDER — MELATONIN 3 MG PO TABS
3.0000 mg | ORAL_TABLET | Freq: Every day | ORAL | Status: DC
  Administered 2023-01-12: 3 mg via ORAL
  Filled 2023-01-12: qty 1

## 2023-01-12 MED ORDER — METHYLPREDNISOLONE SODIUM SUCC 125 MG IJ SOLR
60.0000 mg | Freq: Two times a day (BID) | INTRAMUSCULAR | Status: DC
  Administered 2023-01-12 – 2023-01-13 (×2): 60 mg via INTRAVENOUS
  Filled 2023-01-12 (×2): qty 2

## 2023-01-12 NOTE — Progress Notes (Signed)
PROGRESS NOTE  Tracy Park  XBJ:[id number] DOB: 12-25-1955 DOA: 01/07/2023 PCP: Tracy Hawking, PA-C   Brief Narrative: Patient is a 66 female with history of COPD on 2 L of oxygen at baseline, non small cell lung cancer in remission who presented to the emergency room with complaint of shortness of breath, cough.  She was recently admitted in [place] for pneumonia last month.  Patient was admitted for management of acute COPD exacerbation.  Hospital course remarkable for persistent wheezing, cough.Pulmonology consulted.    Assessment & Plan:  Principal Problem:   COPD exacerbation (HCC) Active Problems:   Hypothyroidism (acquired)   History of lung cancer  Acute COPD exacerbation: Presented with shortness of breath, cough, found to be wheezing.  Started on IV steroids, bronchodilators.  Also on ceftriaxone and azithro COVID/flu/RSV negative. Ongoing cough, wheezing. Continue IV steroids  today with increased dose to 60 mg bid.  On Pulmicort,Continue Mucinex, tussionex, incentive spirometry, Roxy Manns Pulmonology consulted.  She also needs to establish care with pulmonology as an outpatient. Follow-up chest x-ray done on 5/3 did not show any significant change. Continues to have cough, wheezing today.  Chronic hypoxic respiratory failure: Uses 2 L of oxygen at home at baseline for COPD.  Currently on the same   History of lung cancer: History of non-small cell lung cancer.  Status post surgical resection.  Currently believed to be in remission.  Follows with [facility] oncology.  We recommend to follow-up with oncology as an outpatient.  She needs to reestablish her care with her previous oncologist. Follow-up CT scan done on 5/3 showed 7 mm right upper pulmonary nodule.  Needs follow-up in 3 to 6 months with noncontrast CT.  No signs of recurrence of lung cancer   Hypothyroidism: Continue Synthroid   Protein calorie malnutrition: we requested dietitian consult.     Hypertension: Blood pressure stable/soft.  On losartan at home,on hold  Anxiety: Started on xanax  Weakness: Patient seen by PT and recommended home health on discharge      Nutrition Problem: Increased nutrient needs Etiology: chronic illness    DVT prophylaxis:enoxaparin (LOVENOX) injection 30 mg Start: 01/09/23 1400     Code Status: DNR  Family Communication: Called and discussed with daughter on phone on 5/2  Patient status:Inpatient  Patient is from :home  Anticipated discharge HY:QMVH  Estimated DC date:1-2 days   Consultants: Pulmonology  Procedures:None  Antimicrobials:  Anti-infectives (From admission, onward)    Start     Dose/Rate Route Frequency Ordered Stop   01/10/23 1400  azithromycin (ZITHROMAX) 500 mg in sodium chloride 0.9 % 250 mL IVPB        500 mg 250 mL/hr over 60 Minutes Intravenous Every 24 hours 01/10/23 1233     01/10/23 1300  azithromycin (ZITHROMAX) 250 mg in dextrose 5 % 125 mL IVPB  Status:  Discontinued        250 mg 127.5 mL/hr over 60 Minutes Intravenous Every 24 hours 01/10/23 1213 01/10/23 1233   01/08/23 0130  cefTRIAXone (ROCEPHIN) 1 g in sodium chloride 0.9 % 100 mL IVPB        1 g 200 mL/hr over 30 Minutes Intravenous Every 24 hours 01/08/23 0117 01/12/23 0246       Subjective: Patient seen and examined at the bedside today.  Hemodynamically stable.  She is still coughing a lot.  Auscultation revealed bilateral expiratory wheezing.  We discussed about increasing the dose of steroid today.  Objective: Vitals:   01/11/23 2139  01/12/23 0251 01/12/23 0507 01/12/23 0735  BP: 109/65  116/81   Pulse: 92  96   Resp: 16  18   Temp: (!) 97.4 F (36.3 C)  97.9 F (36.6 C)   TempSrc: Oral  Oral   SpO2: 95% 97% 95% 95%  Weight:        Intake/Output Summary (Last 24 hours) at 01/12/2023 1127 Last data filed at 01/12/2023 0926 Gross per 24 hour  Intake 360 ml  Output --  Net 360 ml   Filed Weights   01/08/23 1538   Weight: 42 kg    Examination:  General exam: Overall comfortable, not in distress,looks weak and deconditioned HEENT: PERRL Respiratory system: Bilateral wheezing, rhonchi, diminished sounds Cardiovascular system: S1 & S2 heard, RRR.  Gastrointestinal system: Abdomen is nondistended, soft and nontender. Central nervous system: Alert and oriented Extremities: No edema, no clubbing ,no cyanosis Skin: No rashes, no ulcers,no icterus     Data Reviewed: I have personally reviewed following labs and imaging studies  CBC: Recent Labs  Lab 01/07/23 2212 01/08/23 0031 01/08/23 0604 01/11/23 0723 01/12/23 0902  WBC 10.1  --  6.7 11.3* 13.6*  NEUTROABS 6.1  --   --   --   --   HGB 11.8* 12.2 10.7* 9.8* 10.5*  HCT 38.6 36.0 35.2* 31.9* 34.5*  MCV 93.7  --  92.9 93.5 93.2  PLT 383  --  371 359 468*   Basic Metabolic Panel: Recent Labs  Lab 01/07/23 2212 01/08/23 0031 01/08/23 0604 01/11/23 0723  NA 138 138 138 137  K 4.0 3.9 4.4 3.8  CL 97* 97* 98 98  CO2 33*  --  33* 33*  GLUCOSE 147* 223* 231* 148*  BUN 9 9 13 23   CREATININE 0.63 0.40* 0.60 0.56  CALCIUM 8.5*  --  9.0 8.4*     Recent Results (from the past 240 hour(s))  Resp panel by RT-PCR (RSV, Flu A&B, Covid) Peripheral     Status: None   Collection Time: 01/07/23 11:00 PM   Specimen: Peripheral; Nasal Swab  Result Value Ref Range Status   SARS Coronavirus 2 by RT PCR NEGATIVE NEGATIVE Final    Comment: (NOTE) SARS-CoV-2 target nucleic acids are NOT DETECTED.  The SARS-CoV-2 RNA is generally detectable in upper respiratory specimens during the acute phase of infection. The lowest concentration of SARS-CoV-2 viral copies this assay can detect is 138 copies/mL. A negative result does not preclude SARS-Cov-2 infection and should not be used as the sole basis for treatment or other patient management decisions. A negative result may occur with  improper specimen collection/handling, submission of specimen  other than nasopharyngeal swab, presence of viral mutation(s) within the areas targeted by this assay, and inadequate number of viral copies(<138 copies/mL). A negative result must be combined with clinical observations, patient history, and epidemiological information. The expected result is Negative.  Fact Sheet for Patients:  BloggerCourse.com  Fact Sheet for Healthcare Providers:  SeriousBroker.it  This test is no t yet approved or cleared by the [place] FDA and  has been authorized for detection and/or diagnosis of SARS-CoV-2 by FDA under an Emergency Use Authorization (EUA). This EUA will remain  in effect (meaning this test can be used) for the duration of the COVID-19 declaration under Section 564(b)(1) of the Act, 21 U.S.C.section 360bbb-3(b)(1), unless the authorization is terminated  or revoked sooner.       Influenza A by PCR NEGATIVE NEGATIVE Final   Influenza B by PCR  NEGATIVE NEGATIVE Final    Comment: (NOTE) The Xpert Xpress SARS-CoV-2/FLU/RSV plus assay is intended as an aid in the diagnosis of influenza from Nasopharyngeal swab specimens and should not be used as a sole basis for treatment. Nasal washings and aspirates are unacceptable for Xpert Xpress SARS-CoV-2/FLU/RSV testing.  Fact Sheet for Patients: BloggerCourse.com  Fact Sheet for Healthcare Providers: SeriousBroker.it  This test is not yet approved or cleared by the [place] FDA and has been authorized for detection and/or diagnosis of SARS-CoV-2 by FDA under an Emergency Use Authorization (EUA). This EUA will remain in effect (meaning this test can be used) for the duration of the COVID-19 declaration under Section 564(b)(1) of the Act, 21 U.S.C. section 360bbb-3(b)(1), unless the authorization is terminated or revoked.     Resp Syncytial Virus by PCR NEGATIVE NEGATIVE Final     Comment: (NOTE) Fact Sheet for Patients: BloggerCourse.com  Fact Sheet for Healthcare Providers: SeriousBroker.it  This test is not yet approved or cleared by the [place] FDA and has been authorized for detection and/or diagnosis of SARS-CoV-2 by FDA under an Emergency Use Authorization (EUA). This EUA will remain in effect (meaning this test can be used) for the duration of the COVID-19 declaration under Section 564(b)(1) of the Act, 21 U.S.C. section 360bbb-3(b)(1), unless the authorization is terminated or revoked.  Performed at [facility], [address] [place], [place] [address]   Culture, blood (routine x 2)     Status: None (Preliminary result)   Collection Time: 01/07/23 11:00 PM   Specimen: BLOOD  Result Value Ref Range Status   Specimen Description   Final    BLOOD BLOOD LEFT HAND Performed at [facility], [address], [place], [place] [address]    Special Requests   Final    BOTTLES DRAWN AEROBIC AND ANAEROBIC Blood Culture adequate volume Performed at [facility], [address] [place], [place] [address]    Culture   Final    NO GROWTH 4 DAYS Performed at [facility], [address], [place], [place] [address]    Report Status PENDING  Incomplete  Culture, blood (routine x 2)     Status: None (Preliminary result)   Collection Time: 01/07/23 11:50 PM   Specimen: BLOOD  Result Value Ref Range Status   Specimen Description   Final    BLOOD RIGHT ANTECUBITAL Performed at [facility], [address] [place], [place] [address]    Special Requests   Final    BOTTLES DRAWN AEROBIC AND ANAEROBIC Blood Culture adequate volume Performed at [facility], [address] [place], [place] [address]    Culture   Final    NO GROWTH 4 DAYS Performed at [facility], [address], [place],  [place] [address]    Report Status PENDING  Incomplete     Radiology Studies: CT CHEST WO CONTRAST  Result Date: 01/10/2023 CLINICAL DATA:  Non-small cell lung cancer. Status post left upper lobectomy. EXAM: CT CHEST WITHOUT CONTRAST TECHNIQUE: Multidetector CT imaging of the chest was performed following the standard protocol without IV contrast. RADIATION DOSE REDUCTION: This exam was performed according to the departmental dose-optimization program which includes automated exposure control, adjustment of the mA and/or kV according to patient size and/or use of iterative reconstruction technique. COMPARISON:  CT chest abdomen and pelvis 03/30/2022 FINDINGS: Cardiovascular: No significant vascular findings. Normal heart size. No pericardial effusion. There are atherosclerotic  calcifications is of the aorta. Mediastinum/Nodes: No enlarged mediastinal or axillary lymph nodes. Thyroid gland, trachea, and esophagus demonstrate no significant findings. Lungs/Pleura: Moderate emphysema is again seen. Left upper lobectomy changes are again seen with pleural thickening in the left lung apex. Previously identified left upper lung airspace disease near the apex has resolved in the interval. There is no new focal lung infiltrate. Scattered areas of scarring or atelectasis are stable. There is some plugging of right lower lobe bronchi, new from prior. Patchy slightly nodular density in the posterior right upper lobe measures 7 mm (previously 5 mm). No new pulmonary nodules are seen. No pleural effusion. Upper Abdomen: No acute abnormality. Musculoskeletal: No chest wall mass or suspicious bone lesions identified. Cervical disc spacer present. IMPRESSION: 1. Left upper lobectomy changes. No evidence for local recurrence. 2. 7 mm right upper lobe pulmonary nodule has slightly increased in size when compared to prior. Continued close interval follow-up recommended. Non-contrast chest CT in 3-6 months is suggested. 3. New plugging  of right lower lobe bronchi, likely infectious/inflammatory. Aortic Atherosclerosis (ICD10-I70.0) and Emphysema (ICD10-J43.9). Electronically Signed   By: Darliss Cheney M.D.   On: 01/10/2023 19:26    Scheduled Meds:  albuterol  2.5 mg Nebulization TID   arformoterol  15 mcg Nebulization BID   budesonide (PULMICORT) nebulizer solution  0.5 mg Nebulization BID   chlorpheniramine-HYDROcodone  5 mL Oral Q12H   enoxaparin (LOVENOX) injection  30 mg Subcutaneous Q24H   feeding supplement  237 mL Oral BID BM   guaiFENesin  1,200 mg Oral BID   levothyroxine  125 mcg Oral Q0600   magnesium oxide  400 mg Oral Q1200   melatonin  3 mg Oral QHS   methylPREDNISolone (SOLU-MEDROL) injection  40 mg Intravenous Q12H   mirabegron ER  25 mg Oral Daily   pantoprazole  40 mg Oral Daily   revefenacin  175 mcg Nebulization Daily   rosuvastatin  20 mg Oral Daily   tamsulosin  0.4 mg Oral Daily   Continuous Infusions:  azithromycin 500 mg (01/11/23 1308)     LOS: 3 days   Burnadette Pop, MD [facility] P5/01/2023, 11:27 AM

## 2023-01-13 ENCOUNTER — Telehealth (HOSPITAL_BASED_OUTPATIENT_CLINIC_OR_DEPARTMENT_OTHER): Payer: Self-pay | Admitting: Pulmonary Disease

## 2023-01-13 DIAGNOSIS — J441 Chronic obstructive pulmonary disease with (acute) exacerbation: Secondary | ICD-10-CM | POA: Diagnosis not present

## 2023-01-13 LAB — CBC
HCT: 33.4 % — ABNORMAL LOW (ref 36.0–46.0)
Hemoglobin: 10.3 g/dL — ABNORMAL LOW (ref 12.0–15.0)
MCH: 28.5 pg (ref 26.0–34.0)
MCHC: 30.8 g/dL (ref 30.0–36.0)
MCV: 92.3 fL (ref 80.0–100.0)
Platelets: 379 10*3/uL (ref 150–400)
RBC: 3.62 MIL/uL — ABNORMAL LOW (ref 3.87–5.11)
RDW: 14.9 % (ref 11.5–15.5)
WBC: 12.6 10*3/uL — ABNORMAL HIGH (ref 4.0–10.5)
nRBC: 0 % (ref 0.0–0.2)

## 2023-01-13 LAB — CULTURE, BLOOD (ROUTINE X 2)
Culture: NO GROWTH
Special Requests: ADEQUATE

## 2023-01-13 MED ORDER — ALUM & MAG HYDROXIDE-SIMETH 200-200-20 MG/5ML PO SUSP: 30.0000 mL | ORAL | Status: DC | PRN

## 2023-01-13 MED ORDER — PREDNISONE 10 MG PO TABS: 10.0000 mg | ORAL_TABLET | Freq: Every day | ORAL | 0 refills | Status: DC

## 2023-01-13 MED ORDER — GUAIFENESIN ER 600 MG PO TB12: 1200.0000 mg | ORAL_TABLET | Freq: Two times a day (BID) | ORAL | 0 refills | Status: AC

## 2023-01-13 MED ORDER — ALBUTEROL SULFATE HFA 108 (90 BASE) MCG/ACT IN AERS: 2.0000 | INHALATION_SPRAY | Freq: Four times a day (QID) | RESPIRATORY_TRACT | 1 refills | Status: DC | PRN

## 2023-01-13 MED ORDER — HYDROCOD POLI-CHLORPHE POLI ER 10-8 MG/5ML PO SUER: 5.0000 mL | Freq: Two times a day (BID) | ORAL | 0 refills | Status: AC

## 2023-01-13 NOTE — Care Management Important Message (Signed)
Important Message  Patient Details IM Letter given. Name: JAIDEN TABORA MRN: [id number] Date of Birth: [birth year]   Medicare Important Message Given:  Yes     Caren Macadam 01/13/2023, 10:12 AM

## 2023-01-13 NOTE — TOC Transition Note (Signed)
Transition of Care [facility]) - CM/SW Discharge Note   Patient Details  Name: Tracy Park MRN: [id number] Date of Birth: 1956/03/26  Transition of Care [facility]) CM/SW Contact:  Beckie Busing, RN Phone Number:[phone]  01/13/2023, 1:46 PM   Clinical Narrative:    Patient discharging home. Transportation is being provided per safe transport. Ride waiver signed and placed in chart.    Final next level of care: Home/Self Care Barriers to Discharge: No Barriers Identified   Patient Goals and CMS Choice CMS Medicare.gov Compare Post Acute Care list provided to:: Patient Choice offered to / list presented to : Patient  Discharge Placement                         Discharge Plan and Services Additional resources added to the After Visit Summary for   In-house Referral: NA Discharge Planning Services: CM Consult, Other - See comment (outpatient PT) Post Acute Care Choice: Home Health          DME Arranged: N/A DME Agency: NA       HH Arranged: Refused HH (Patient is open to outpatient therapy)          Social Determinants of Health (SDOH) Interventions SDOH Screenings   Food Insecurity: No Food Insecurity (01/08/2023)  Housing: Low Risk  (01/08/2023)  Transportation Needs: No Transportation Needs (01/08/2023)  Utilities: Not At Risk (01/08/2023)  Tobacco Use: High Risk (01/07/2023)     Readmission Risk Interventions    01/10/2023   11:16 AM 03/01/2022    9:00 AM  Readmission Risk Prevention Plan  Transportation Screening Complete Complete  PCP or Specialist Appt within 5-7 Days Complete Complete  Home Care Screening Complete Complete  Medication Review (RN CM) Complete Complete

## 2023-01-13 NOTE — Discharge Summary (Signed)
Physician Discharge Summary  Tracy Park:[id number] DOB: April 19, 1956 DOA: 01/07/2023  PCP: Jacquelin Hawking, PA-C  Admit date: 01/07/2023 Discharge date: 01/13/2023  Admitted From: Home Disposition:  Home  Discharge Condition:Stable CODE STATUS:DNR Diet recommendation:  Regular / Dysphagia   Brief/Interim Summary: Patient is a 31 female with history of COPD on 2 L of oxygen at baseline, non small cell lung cancer in remission who presented to the emergency room with complaint of shortness of breath, cough. She was recently admitted in [place] for pneumonia last month. Patient was admitted for management of acute COPD exacerbation. Hospital course remarkable for persistent wheezing, cough.Pulmonology consulted.  Respiratory status gradually improved with the steroids and antibiotics.  Today she feels much better.  She is at baseline requirement of oxygen.  She is medically stable for discharge home today.  She will be followed by pulmonology as an outpatient   Following problems were addressed during the hospitalization:  Acute COPD exacerbation: Presented with shortness of breath, cough, found to be wheezing.  Started on IV steroids, bronchodilators.  Started  on ceftriaxone and azithro COVID/flu/RSV negative. Was also on  Pulmicort,Continue Mucinex, tussionex, incentive spirometry, Roxy Manns Pulmonology consulted.   Follow-up chest x-ray done on 5/3 did not show any significant change. Feels much better today.  Wants to go home.  Pulmonology cleared for discharge.  She will be followed by pulmonology as an outpatient in 1 to 2 weeks   Chronic hypoxic respiratory failure: Uses 2 L of oxygen at home at baseline for COPD.  Currently on the same   History of lung cancer: History of non-small cell lung cancer.  Status post surgical resection.  Currently believed to be in remission.  Follows with [facility] oncology.  We recommend to follow-up with oncology as an outpatient.  She needs  to reestablish her care with her previous oncologist. Follow-up CT scan done on 5/3 showed 7 mm right upper pulmonary nodule.  Needs follow-up in 3 to 6 months with noncontrast CT.  No signs of recurrence of lung cancer   Hypothyroidism: Continue Synthroid   Protein calorie malnutrition: we requested dietitian consult.     Hypertension: Blood pressure stable/soft.  On losartan at home,on hold   Anxiety: Started on xanax   Weakness: Patient seen by PT and recommended home health on discharge      Discharge Diagnoses:  Principal Problem:   COPD exacerbation (HCC) Active Problems:   Hypothyroidism (acquired)   History of lung cancer    Discharge Instructions  Discharge Instructions     Ambulatory referral to Physical Therapy   Complete by: As directed    Ambulatory referral to Pulmonology   Complete by: As directed    COPD with AE, OSA, history of NSCLC   Reason for referral: Asthma/COPD   Diet general   Complete by: As directed    Discharge instructions   Complete by: As directed    1)Please stop smoking 2)Follow up with your PCP in a week. 3)You will be called by pulmonology for follow-up appointment 4)Follow up with your oncologist.   Increase activity slowly   Complete by: As directed       Allergies as of 01/13/2023       Reactions   Red Dye Hives, Itching   Oxycontin [oxycodone Hcl] Other (See Comments)   Hallucinations    Prednisone Hives, Nausea And Vomiting   Aspirin Hives   Tape Rash   Prefers paper tape   Wound Dressing Adhesive Rash  Medication List     STOP taking these medications    losartan 25 MG tablet Commonly known as: COZAAR       TAKE these medications    albuterol 108 (90 Base) MCG/ACT inhaler Commonly known as: VENTOLIN HFA Inhale 2 puffs into the lungs every 6 (six) hours as needed for wheezing or shortness of breath.   Breztri Aerosphere 160-9-4.8 MCG/ACT Aero Generic drug: Budeson-Glycopyrrol-Formoterol Inhale  1 puff into the lungs in the morning and at bedtime.   chlorpheniramine-HYDROcodone 10-8 MG/5ML Commonly known as: TUSSIONEX Take 5 mLs by mouth every 12 (twelve) hours for 5 days.   Gemtesa 75 MG Tabs Generic drug: Vibegron Take 1 tablet by mouth daily.   guaiFENesin 600 MG 12 hr tablet Commonly known as: MUCINEX Take 2 tablets (1,200 mg total) by mouth 2 (two) times daily for 7 days. What changed: how much to take   levothyroxine 125 MCG tablet Commonly known as: SYNTHROID Take 125 mcg by mouth daily before breakfast.   Magnesium Oxide 400 MG Caps Take 1 capsule (400 mg total) by mouth daily at 12 noon.   nitroGLYCERIN 0.4 MG SL tablet Commonly known as: NITROSTAT Place 0.4 mg under the tongue every 5 (five) minutes as needed for chest pain.   OXYGEN Inhale 2 L into the lungs daily as needed (for low oxygen).   pantoprazole 40 MG tablet Commonly known as: PROTONIX Take 40 mg by mouth daily.   predniSONE 10 MG tablet Commonly known as: DELTASONE Take 1 tablet (10 mg total) by mouth daily. Take 4 pills daily for 3 days then 3 pills daily for 3 days then 2 pills daily for 3 days then 1 pill daily for 3 days then stop   rosuvastatin 20 MG tablet Commonly known as: CRESTOR Take 20 mg by mouth daily.   tamsulosin 0.4 MG Caps capsule Commonly known as: FLOMAX Take 0.4 mg by mouth daily.        Follow-up Information     [facility] Follow up.   Why: You have been referred to outpatient rehab. Please call the number listed above to follow up on this referral after you are discharged. Contact information: [address] [place], [place] [address] Phone: [phone]        Jacquelin Hawking, PA-C. Schedule an appointment as soon as possible for a visit in 1 week(s).   Specialty: Physician Assistant Contact information: [address] [place] [address] [phone]                Allergies  Allergen Reactions   Red  Dye Hives and Itching   Oxycontin [Oxycodone Hcl] Other (See Comments)    Hallucinations    Prednisone Hives and Nausea And Vomiting   Aspirin Hives   Tape Rash    Prefers paper tape   Wound Dressing Adhesive Rash    Consultations: PCCM   Procedures/Studies: CT CHEST WO CONTRAST  Result Date: 01/10/2023 CLINICAL DATA:  Non-small cell lung cancer. Status post left upper lobectomy. EXAM: CT CHEST WITHOUT CONTRAST TECHNIQUE: Multidetector CT imaging of the chest was performed following the standard protocol without IV contrast. RADIATION DOSE REDUCTION: This exam was performed according to the departmental dose-optimization program which includes automated exposure control, adjustment of the mA and/or kV according to patient size and/or use of iterative reconstruction technique. COMPARISON:  CT chest abdomen and pelvis 03/30/2022 FINDINGS: Cardiovascular: No significant vascular findings. Normal heart size. No pericardial effusion. There are  atherosclerotic calcifications is of the aorta. Mediastinum/Nodes: No enlarged mediastinal or axillary lymph nodes. Thyroid gland, trachea, and esophagus demonstrate no significant findings. Lungs/Pleura: Moderate emphysema is again seen. Left upper lobectomy changes are again seen with pleural thickening in the left lung apex. Previously identified left upper lung airspace disease near the apex has resolved in the interval. There is no new focal lung infiltrate. Scattered areas of scarring or atelectasis are stable. There is some plugging of right lower lobe bronchi, new from prior. Patchy slightly nodular density in the posterior right upper lobe measures 7 mm (previously 5 mm). No new pulmonary nodules are seen. No pleural effusion. Upper Abdomen: No acute abnormality. Musculoskeletal: No chest wall mass or suspicious bone lesions identified. Cervical disc spacer present. IMPRESSION: 1. Left upper lobectomy changes. No evidence for local recurrence. 2. 7 mm right  upper lobe pulmonary nodule has slightly increased in size when compared to prior. Continued close interval follow-up recommended. Non-contrast chest CT in 3-6 months is suggested. 3. New plugging of right lower lobe bronchi, likely infectious/inflammatory. Aortic Atherosclerosis (ICD10-I70.0) and Emphysema (ICD10-J43.9). Electronically Signed   By: Darliss Cheney M.D.   On: 01/10/2023 19:26   DG CHEST PORT 1 VIEW  Result Date: 01/10/2023 CLINICAL DATA:  Wheezing EXAM: PORTABLE CHEST 1 VIEW COMPARISON:  01/07/2023 and older FINDINGS: Once again volume loss along the left hemithorax from prior resection. Apical pleural thickening and retraction. Shift of the mediastinum from right to left. Right lung is clear. Hyperinflated. No consolidation or pneumothorax. No edema. Normal cardiopericardial silhouette IMPRESSION: Stable postsurgical changes along the left hemithorax with volume loss and apical pleural thickening. Hyperinflation Electronically Signed   By: Karen Kays M.D.   On: 01/10/2023 12:22   DG Chest Port 1 View  Result Date: 01/07/2023 CLINICAL DATA:  Shortness of breath EXAM: PORTABLE CHEST 1 VIEW COMPARISON:  04/01/2022 FINDINGS: Cardiac shadow is stable. Chronic scarring is noted in the left apex with pleural capping. No new focal infiltrate or effusion is seen. No bony abnormality is noted. IMPRESSION: Chronic scarring in the left apex.  No acute abnormality noted. Electronically Signed   By: Alcide Clever M.D.   On: 01/07/2023 23:01      Subjective: Patient seen and examined at bedside today.  Feels great today.  Wheezing and cough have improved.  Medically stable for discharge.  I called the daughter to discuss about dc plan,call not received  Discharge Exam: Vitals:   01/13/23 0815 01/13/23 0816  BP:    Pulse:    Resp:    Temp:    SpO2: 97% 97%   Vitals:   01/13/23 0530 01/13/23 0814 01/13/23 0815 01/13/23 0816  BP: 120/75     Pulse: 76     Resp: 15     Temp: 97.6 F (36.4  C)     TempSrc: Oral     SpO2: 97% 97% 97% 97%  Weight:        General: Pt is alert, awake, not in acute distress Cardiovascular: RRR, S1/S2 +, no rubs, no gallops Respiratory: Diminished sounds bilaterally, mild expiratory bilateral wheezing Abdominal: Soft, NT, ND, bowel sounds + Extremities: no edema, no cyanosis    The results of significant diagnostics from this hospitalization (including imaging, microbiology, ancillary and laboratory) are listed below for reference.     Microbiology: Recent Results (from the past 240 hour(s))  Resp panel by RT-PCR (RSV, Flu A&B, Covid) Peripheral     Status: None   Collection Time: 01/07/23  11:00 PM   Specimen: Peripheral; Nasal Swab  Result Value Ref Range Status   SARS Coronavirus 2 by RT PCR NEGATIVE NEGATIVE Final    Comment: (NOTE) SARS-CoV-2 target nucleic acids are NOT DETECTED.  The SARS-CoV-2 RNA is generally detectable in upper respiratory specimens during the acute phase of infection. The lowest concentration of SARS-CoV-2 viral copies this assay can detect is 138 copies/mL. A negative result does not preclude SARS-Cov-2 infection and should not be used as the sole basis for treatment or other patient management decisions. A negative result may occur with  improper specimen collection/handling, submission of specimen other than nasopharyngeal swab, presence of viral mutation(s) within the areas targeted by this assay, and inadequate number of viral copies(<138 copies/mL). A negative result must be combined with clinical observations, patient history, and epidemiological information. The expected result is Negative.  Fact Sheet for Patients:  BloggerCourse.com  Fact Sheet for Healthcare Providers:  SeriousBroker.it  This test is no t yet approved or cleared by the [place] FDA and  has been authorized for detection and/or diagnosis of SARS-CoV-2 by FDA under an  Emergency Use Authorization (EUA). This EUA will remain  in effect (meaning this test can be used) for the duration of the COVID-19 declaration under Section 564(b)(1) of the Act, 21 U.S.C.section 360bbb-3(b)(1), unless the authorization is terminated  or revoked sooner.       Influenza A by PCR NEGATIVE NEGATIVE Final   Influenza B by PCR NEGATIVE NEGATIVE Final    Comment: (NOTE) The Xpert Xpress SARS-CoV-2/FLU/RSV plus assay is intended as an aid in the diagnosis of influenza from Nasopharyngeal swab specimens and should not be used as a sole basis for treatment. Nasal washings and aspirates are unacceptable for Xpert Xpress SARS-CoV-2/FLU/RSV testing.  Fact Sheet for Patients: BloggerCourse.com  Fact Sheet for Healthcare Providers: SeriousBroker.it  This test is not yet approved or cleared by the [place] FDA and has been authorized for detection and/or diagnosis of SARS-CoV-2 by FDA under an Emergency Use Authorization (EUA). This EUA will remain in effect (meaning this test can be used) for the duration of the COVID-19 declaration under Section 564(b)(1) of the Act, 21 U.S.C. section 360bbb-3(b)(1), unless the authorization is terminated or revoked.     Resp Syncytial Virus by PCR NEGATIVE NEGATIVE Final    Comment: (NOTE) Fact Sheet for Patients: BloggerCourse.com  Fact Sheet for Healthcare Providers: SeriousBroker.it  This test is not yet approved or cleared by the [place] FDA and has been authorized for detection and/or diagnosis of SARS-CoV-2 by FDA under an Emergency Use Authorization (EUA). This EUA will remain in effect (meaning this test can be used) for the duration of the COVID-19 declaration under Section 564(b)(1) of the Act, 21 U.S.C. section 360bbb-3(b)(1), unless the authorization is terminated or revoked.  Performed at [facility], [address], [place], [place] [address]   Culture, blood (routine x 2)     Status: None (Preliminary result)   Collection Time: 01/07/23 11:00 PM   Specimen: BLOOD  Result Value Ref Range Status   Specimen Description   Final    BLOOD BLOOD LEFT HAND Performed at [facility], [address] [place], [place] [address]    Special Requests   Final    BOTTLES DRAWN AEROBIC AND ANAEROBIC Blood Culture adequate volume Performed at [facility], [address], [place], [place] [address]    Culture   Final    NO GROWTH  4 DAYS Performed at [facility], [address] [place], [place] [address]    Report Status PENDING  Incomplete  Culture, blood (routine x 2)     Status: None (Preliminary result)   Collection Time: 01/07/23 11:50 PM   Specimen: BLOOD  Result Value Ref Range Status   Specimen Description   Final    BLOOD RIGHT ANTECUBITAL Performed at [facility], [address] [place], [place] [address]    Special Requests   Final    BOTTLES DRAWN AEROBIC AND ANAEROBIC Blood Culture adequate volume Performed at [facility], [address], [place], [place] [address]    Culture   Final    NO GROWTH 4 DAYS Performed at [facility], [address] [place], [place] [address]    Report Status PENDING  Incomplete     Labs: BNP (last 3 results) Recent Labs    03/06/22 0402 01/07/23 2212  BNP 96.6 82.8   Basic Metabolic Panel: Recent Labs  Lab 01/07/23 2212 01/08/23 0031 01/08/23 0604 01/11/23 0723  NA 138 138 138 137  K 4.0 3.9 4.4 3.8  CL 97* 97* 98 98  CO2 33*  --  33* 33*  GLUCOSE 147* 223* 231* 148*  BUN 9 9 13 23   CREATININE 0.63 0.40* 0.60 0.56  CALCIUM 8.5*  --  9.0 8.4*   Liver Function Tests: Recent Labs  Lab 01/07/23 2212  AST 13*  ALT 15  ALKPHOS 72  BILITOT 0.1*  PROT 6.5  ALBUMIN 2.9*   No results for input(s): "LIPASE", "AMYLASE" in  the last 168 hours. No results for input(s): "AMMONIA" in the last 168 hours. CBC: Recent Labs  Lab 01/07/23 2212 01/08/23 0031 01/08/23 0604 01/11/23 0723 01/12/23 0902 01/13/23 0804  WBC 10.1  --  6.7 11.3* 13.6* 12.6*  NEUTROABS 6.1  --   --   --   --   --   HGB 11.8* 12.2 10.7* 9.8* 10.5* 10.3*  HCT 38.6 36.0 35.2* 31.9* 34.5* 33.4*  MCV 93.7  --  92.9 93.5 93.2 92.3  PLT 383  --  371 359 468* 379   Cardiac Enzymes: No results for input(s): "CKTOTAL", "CKMB", "CKMBINDEX", "TROPONINI" in the last 168 hours. BNP: Invalid input(s): "POCBNP" CBG: No results for input(s): "GLUCAP" in the last 168 hours. D-Dimer No results for input(s): "DDIMER" in the last 72 hours. Hgb A1c No results for input(s): "HGBA1C" in the last 72 hours. Lipid Profile No results for input(s): "CHOL", "HDL", "LDLCALC", "TRIG", "CHOLHDL", "LDLDIRECT" in the last 72 hours. Thyroid function studies No results for input(s): "TSH", "T4TOTAL", "T3FREE", "THYROIDAB" in the last 72 hours.  Invalid input(s): "FREET3" Anemia work up No results for input(s): "VITAMINB12", "FOLATE", "FERRITIN", "TIBC", "IRON", "RETICCTPCT" in the last 72 hours. Urinalysis    Component Value Date/Time   COLORURINE STRAW (A) 03/11/2022 1743   APPEARANCEUR CLEAR 03/11/2022 1743   LABSPEC 1.006 03/11/2022 1743   PHURINE 8.0 03/11/2022 1743   GLUCOSEU NEGATIVE 03/11/2022 1743   HGBUR NEGATIVE 03/11/2022 1743   BILIRUBINUR NEGATIVE 03/11/2022 1743   KETONESUR NEGATIVE 03/11/2022 1743   PROTEINUR NEGATIVE 03/11/2022 1743   UROBILINOGEN 0.2 11/06/2009 1559   NITRITE NEGATIVE 03/11/2022 1743   LEUKOCYTESUR NEGATIVE 03/11/2022 1743   Sepsis Labs Recent Labs  Lab 01/08/23 0604 01/11/23 0723 01/12/23 0902 01/13/23 0804  WBC 6.7 11.3* 13.6* 12.6*   Microbiology Recent Results (from the past 240 hour(s))  Resp panel by RT-PCR (  RSV, Flu A&B, Covid) Peripheral     Status: None   Collection Time: 01/07/23 11:00 PM    Specimen: Peripheral; Nasal Swab  Result Value Ref Range Status   SARS Coronavirus 2 by RT PCR NEGATIVE NEGATIVE Final    Comment: (NOTE) SARS-CoV-2 target nucleic acids are NOT DETECTED.  The SARS-CoV-2 RNA is generally detectable in upper respiratory specimens during the acute phase of infection. The lowest concentration of SARS-CoV-2 viral copies this assay can detect is 138 copies/mL. A negative result does not preclude SARS-Cov-2 infection and should not be used as the sole basis for treatment or other patient management decisions. A negative result may occur with  improper specimen collection/handling, submission of specimen other than nasopharyngeal swab, presence of viral mutation(s) within the areas targeted by this assay, and inadequate number of viral copies(<138 copies/mL). A negative result must be combined with clinical observations, patient history, and epidemiological information. The expected result is Negative.  Fact Sheet for Patients:  BloggerCourse.com  Fact Sheet for Healthcare Providers:  SeriousBroker.it  This test is no t yet approved or cleared by the [place] FDA and  has been authorized for detection and/or diagnosis of SARS-CoV-2 by FDA under an Emergency Use Authorization (EUA). This EUA will remain  in effect (meaning this test can be used) for the duration of the COVID-19 declaration under Section 564(b)(1) of the Act, 21 U.S.C.section 360bbb-3(b)(1), unless the authorization is terminated  or revoked sooner.       Influenza A by PCR NEGATIVE NEGATIVE Final   Influenza B by PCR NEGATIVE NEGATIVE Final    Comment: (NOTE) The Xpert Xpress SARS-CoV-2/FLU/RSV plus assay is intended as an aid in the diagnosis of influenza from Nasopharyngeal swab specimens and should not be used as a sole basis for treatment. Nasal washings and aspirates are unacceptable for Xpert Xpress  SARS-CoV-2/FLU/RSV testing.  Fact Sheet for Patients: BloggerCourse.com  Fact Sheet for Healthcare Providers: SeriousBroker.it  This test is not yet approved or cleared by the [place] FDA and has been authorized for detection and/or diagnosis of SARS-CoV-2 by FDA under an Emergency Use Authorization (EUA). This EUA will remain in effect (meaning this test can be used) for the duration of the COVID-19 declaration under Section 564(b)(1) of the Act, 21 U.S.C. section 360bbb-3(b)(1), unless the authorization is terminated or revoked.     Resp Syncytial Virus by PCR NEGATIVE NEGATIVE Final    Comment: (NOTE) Fact Sheet for Patients: BloggerCourse.com  Fact Sheet for Healthcare Providers: SeriousBroker.it  This test is not yet approved or cleared by the [place] FDA and has been authorized for detection and/or diagnosis of SARS-CoV-2 by FDA under an Emergency Use Authorization (EUA). This EUA will remain in effect (meaning this test can be used) for the duration of the COVID-19 declaration under Section 564(b)(1) of the Act, 21 U.S.C. section 360bbb-3(b)(1), unless the authorization is terminated or revoked.  Performed at [facility], [address], [place], [place] [address]   Culture, blood (routine x 2)     Status: None (Preliminary result)   Collection Time: 01/07/23 11:00 PM   Specimen: BLOOD  Result Value Ref Range Status   Specimen Description   Final    BLOOD BLOOD LEFT HAND Performed at [facility], [address], [place], [place] [address]    Special Requests   Final    BOTTLES DRAWN AEROBIC AND ANAEROBIC Blood Culture adequate volume Performed at [facility], [address] [place], [place] [address]    Culture   Final    NO GROWTH 4 DAYS Performed at [facility], [address] [place], [place] [address]    Report Status PENDING  Incomplete  Culture, blood (routine x 2)     Status: None (Preliminary result)   Collection Time: 01/07/23 11:50 PM   Specimen: BLOOD  Result Value Ref Range Status   Specimen Description   Final    BLOOD RIGHT ANTECUBITAL Performed at [facility], [address], [place], [place] [address]    Special Requests   Final    BOTTLES DRAWN AEROBIC AND ANAEROBIC Blood Culture adequate volume Performed at [facility], [address] [place], [place] [address]    Culture   Final    NO GROWTH 4 DAYS Performed at [facility], [address] [place], [place] [address]    Report Status PENDING  Incomplete    Please note: You were cared for by a hospitalist during your hospital stay. Once you are discharged, your primary care physician will handle any further medical issues. Please note that NO REFILLS for any discharge medications will be authorized once you are discharged, as it is imperative that you return to your primary care physician (or establish a relationship with a primary care physician if you do not have one) for your post hospital discharge needs so that they can reassess your need for medications and monitor your lab values.    Time coordinating discharge: 40 minutes  SIGNED:   Burnadette Pop, MD  [facility] 01/13/2023, 10:39 AM Pager [phone]  If 7PM-7AM, please contact night-coverage [email or web address] Password [email or web address]

## 2023-01-13 NOTE — Progress Notes (Signed)
Mobility Specialist - Progress Note   01/13/23 1000  Mobility  Activity Ambulated with assistance in hallway  Level of Assistance Modified independent, requires aide device or extra time  Assistive Device None  Distance Ambulated (ft) 500 ft  Range of Motion/Exercises Active  Activity Response Tolerated well  Mobility Referral Yes  $Mobility charge 1 Mobility   Pt received in bed and agreed to mobility, felt fatigued midway through, felt she was walking too fast. Pt returned to chair with all needs met.  Tracy Park Mobility Specialist

## 2023-01-13 NOTE — Telephone Encounter (Signed)
Patient scheduled with Dr. Thora Lance for COPD,lung nodule HFU 01/27/23 at 0930.

## 2023-01-13 NOTE — Progress Notes (Signed)
NAME:  Tracy Park, MRN:  [id number], DOB:  02/22/1956, LOS: 4 ADMISSION DATE:  01/07/2023 CONSULTATION DATE:  01/10/2023 REFERRING MD:  Renford Dills - [facility] CHIEF COMPLAINT:  SOB, wheezing   History of Present Illness:  Tracy Park is seen in consultation at the request of Dr. Renford Dills [facility]) for recommendations on further evaluation and management of SOB/wheezing associated with COPD/chronic hypoxemic respiratory failure.   67 year old woman who presented to [facility] 4/30 with SOB, wheezing and productive cough. PMHx significant for COPD with acute-on-chronic hypoxemic respiratory failure (on HOT 2L, still actively smoking), OSA, NSCLC (in remission, s/p left upper lobectomy/XRT with [facility]), hypothyroidism, MI, bipolar disorder/anxiety, PTSD.   Recently moved back to the [place] area to be closer to family. Her husband passed in 2022 and she temporarily relocated to [facility]. Recent admission in [place] for PNA, she was treated with oral antibiotics and prednisone. She states that she did not feel back to her baseline prior to leaving the hospital in [facility]; ultimately felt much more poorly on date of admission requiring increased O2 and EMS call for transfer to hospital. At that time, denied fever/chills, endorsed chest pressure/tightness and SOB. Endorsed cough that was productive of yellow sputum. No n/v/d or abdominal pain. No significant swelling or SOB with lying flat. She has been utilizing her maintenance inhaler. Feels better with cool/dry air, felt humidity/warm air in [place] exacerbated her symptoms.  Received DuoNeb x 3 and Solumedrol 125mg  IV with EMS; additional nebulizer treatments administered in ED. On ED arrival, SpO2 100% on 6L, RR 20. +Cough productive of yellow phlegm. COVID/Flu/RSV negative. Empirically started on ceftriaxone.   Pertinent Medical History:   Past Medical History:  Diagnosis Date   Anginal pain (HCC)    Anxiety    Bipolar disorder (HCC)    Cancer (HCC)    COPD (chronic  obstructive pulmonary disease) (HCC)    Dyspnea    Family history of adverse reaction to anesthesia    History of kidney stones    Hydroureteronephrosis 08/16/2021   Hypothyroidism    Lung cancer (HCC)    Myocardial infarction [facility])    PTSD (post-traumatic stress disorder)    Sleep apnea    Thyroid disease    Significant Hospital Events: Including procedures, antibiotic start and stop dates in addition to other pertinent events   5/3 - PCCM consulted for SOB/wheezing. 5/6 - Improving  Interim History / Subjective:   Improved symptoms. Near baseline oxygen  Objective:  Blood pressure 120/75, pulse 76, temperature 97.6 F (36.4 C), temperature source Oral, resp. rate 15, weight 42 kg, SpO2 97 %.        Intake/Output Summary (Last 24 hours) at 01/13/2023 0911 Last data filed at 01/12/2023 2200 Gross per 24 hour  Intake 1230 ml  Output --  Net 1230 ml   Filed Weights   01/08/23 1538  Weight: 42 kg   Physical Exam: General: Well-appearing, no acute distress HENT: [place], AT Eyes: EOMI, no scleral icterus Respiratory: Diminished clear to auscultation bilaterally.  No crackles, wheezing or rales Cardiovascular: RRR, -M/R/G, no JVD Extremities:-Edema,-tenderness Neuro: AAO x4, CNII-XII grossly intact Psych: Normal mood, normal affect  CT chest from 5/3 reviewed with chronic postsurgical changes, 7 mm nodule is slightly increased.  Mucus in the right lower lobe bronchi.   Resolved Hospital Problem List:    Assessment & Plan:   67 year old woman who presented to [facility] 4/30 with SOB and wheezing. PMHx significant for COPD with acute-on-chronic hypoxemic respiratory failure (on HOT 2L,  still actively smoking. She was apparently recently hospitalized in [facility] for PNA, but we are unable to see these records/view CT imaging obtained at that time. CXR actually looks quite reassuring.  Acute-on-chronic hypoxemic respiratory failure in the setting of COPD, ongoing active  smoking OSA History of lung CA Clinically improving. Near home O2 baseline  - On baseline home O2 - Wean O2 for sat 88-92% (on HOT 2-3LNC) - Bronchodilators adjusted to Brovana/Yupelri + Pulmicort, albuterol PRN, continue Breztri at discharge - Pulmonary hygiene (IS/flutter, OOB/encourage mobilization) - Continue expectorant (Mucinex), cough suppressant (Tussionex) - Complete CAP coverage, ceftriaxone/azithromycin (added 5/3) - Previously followed with Dr. Randolm Idol from [place] but would prefer to establish care at [facility] pulmonary   RUL Lung nodule Discussed with patient.  She will need a 21-month follow-up scan to be scheduled with Pulmonary at next visit or Oncology if she can re-establish care sooner  Best Practice: (right click and "Reselect all SmartList Selections" daily)   Per Primary Team  Signature:   Care Time: 35 min  Colson Barco Mechele Collin, MD [facility] 01/13/23 9:11 AM  Please see [email or web address] for pager details.  From 7A-7P if no response, please call [phone] After hours, please call ELink [phone]

## 2023-01-14 ENCOUNTER — Other Ambulatory Visit: Payer: Self-pay

## 2023-01-14 ENCOUNTER — Encounter (HOSPITAL_COMMUNITY): Payer: Self-pay

## 2023-01-14 ENCOUNTER — Emergency Department (HOSPITAL_COMMUNITY)
Admission: EM | Admit: 2023-01-14 | Discharge: 2023-01-15 | Disposition: A | Discharge status: Home / Self Care | Payer: 59 | Attending: Emergency Medicine | Admitting: Emergency Medicine

## 2023-01-14 DIAGNOSIS — Z85118 Personal history of other malignant neoplasm of bronchus and lung: Secondary | ICD-10-CM | POA: Diagnosis not present

## 2023-01-14 DIAGNOSIS — J449 Chronic obstructive pulmonary disease, unspecified: Secondary | ICD-10-CM | POA: Insufficient documentation

## 2023-01-14 DIAGNOSIS — R600 Localized edema: Secondary | ICD-10-CM | POA: Insufficient documentation

## 2023-01-14 DIAGNOSIS — M7989 Other specified soft tissue disorders: Secondary | ICD-10-CM | POA: Diagnosis present

## 2023-01-14 LAB — CBC WITH DIFFERENTIAL/PLATELET
Abs Immature Granulocytes: 0.33 10*3/uL — ABNORMAL HIGH (ref 0.00–0.07)
Basophils Absolute: 0.1 10*3/uL (ref 0.0–0.1)
Basophils Relative: 0 %
Eosinophils Absolute: 0.1 10*3/uL (ref 0.0–0.5)
Eosinophils Relative: 0 %
HCT: 35 % — ABNORMAL LOW (ref 36.0–46.0)
Hemoglobin: 10.9 g/dL — ABNORMAL LOW (ref 12.0–15.0)
Immature Granulocytes: 2 %
Lymphocytes Relative: 27 %
Lymphs Abs: 4.1 10*3/uL — ABNORMAL HIGH (ref 0.7–4.0)
MCH: 28.9 pg (ref 26.0–34.0)
MCHC: 31.1 g/dL (ref 30.0–36.0)
MCV: 92.8 fL (ref 80.0–100.0)
Monocytes Absolute: 1.1 10*3/uL — ABNORMAL HIGH (ref 0.1–1.0)
Monocytes Relative: 7 %
Neutro Abs: 9.9 10*3/uL — ABNORMAL HIGH (ref 1.7–7.7)
Neutrophils Relative %: 64 %
Platelets: 352 10*3/uL (ref 150–400)
RBC: 3.77 MIL/uL — ABNORMAL LOW (ref 3.87–5.11)
RDW: 15.8 % — ABNORMAL HIGH (ref 11.5–15.5)
WBC: 15.6 10*3/uL — ABNORMAL HIGH (ref 4.0–10.5)
nRBC: 0 % (ref 0.0–0.2)

## 2023-01-14 LAB — COMPREHENSIVE METABOLIC PANEL
ALT: 24 U/L (ref 0–44)
AST: 17 U/L (ref 15–41)
Albumin: 3 g/dL — ABNORMAL LOW (ref 3.5–5.0)
Alkaline Phosphatase: 55 U/L (ref 38–126)
Anion gap: 8 (ref 5–15)
BUN: 22 mg/dL (ref 8–23)
CO2: 34 mmol/L — ABNORMAL HIGH (ref 22–32)
Calcium: 8.4 mg/dL — ABNORMAL LOW (ref 8.9–10.3)
Chloride: 98 mmol/L (ref 98–111)
Creatinine, Ser: 0.52 mg/dL (ref 0.44–1.00)
GFR, Estimated: >60 mL/min (ref >60)
Glucose, Bld: 87 mg/dL (ref 70–99)
Potassium: 3.8 mmol/L (ref 3.5–5.1)
Sodium: 140 mmol/L (ref 135–145)
Total Bilirubin: 0.4 mg/dL (ref 0.3–1.2)
Total Protein: 5.7 g/dL — ABNORMAL LOW (ref 6.5–8.1)

## 2023-01-14 LAB — BRAIN NATRIURETIC PEPTIDE: B Natriuretic Peptide: 123.4 pg/mL — ABNORMAL HIGH (ref 0.0–100.0)

## 2023-01-14 MED ORDER — FUROSEMIDE 20 MG PO TABS: 20.0000 mg | ORAL_TABLET | Freq: Every day | ORAL | 0 refills | Status: DC

## 2023-01-14 MED ORDER — FUROSEMIDE 10 MG/ML IJ SOLN
20.0000 mg | Freq: Once | INTRAMUSCULAR | Status: AC
  Administered 2023-01-14: 20 mg via INTRAVENOUS
  Filled 2023-01-14: qty 4

## 2023-01-14 NOTE — ED Triage Notes (Signed)
Patient discharged from the hospital yesterday. Complaining of bilateral ankle swelling. Patient wears 2L [place] at baseline.

## 2023-01-14 NOTE — ED Provider Notes (Signed)
Tracy Park EMERGENCY DEPARTMENT AT [facility] Provider Note   CSN: [id number] Arrival date & time: 01/14/23  1857     History  Chief Complaint  Patient presents with   Joint Swelling    Tracy Park is a 67 y.o. female.  HPI    46 female with history of COPD on 2 L of oxygen at baseline, non small cell lung cancer in remission who presented to the emergency room with complaint of bilateral lower extremity wounds  Patient was just admitted to the hospital for COPD exacerbation.  She was discharged noted that she had swelling leg.  She was unable to put her shoes on.  She has not had any history of CHF.  She denies any known liver disease or kidney disease history.  Home Medications Prior to Admission medications   Medication Sig Start Date End Date Taking? Authorizing Provider  albuterol (VENTOLIN HFA) 108 (90 Base) MCG/ACT inhaler Inhale 2 puffs into the lungs every 6 (six) hours as needed for wheezing or shortness of breath. 01/13/23   Burnadette Pop, MD  Budeson-Glycopyrrol-Formoterol (BREZTRI AEROSPHERE) 160-9-4.8 MCG/ACT AERO Inhale 1 puff into the lungs in the morning and at bedtime.    [provider]  chlorpheniramine-HYDROcodone (TUSSIONEX) 10-8 MG/5ML Take 5 mLs by mouth every 12 (twelve) hours for 5 days. 01/13/23 01/18/23  Burnadette Pop, MD  guaiFENesin (MUCINEX) 600 MG 12 hr tablet Take 2 tablets (1,200 mg total) by mouth 2 (two) times daily for 7 days. 01/13/23 01/20/23  Burnadette Pop, MD  levothyroxine (SYNTHROID) 125 MCG tablet Take 125 mcg by mouth daily before breakfast.    [provider]  Magnesium Oxide 400 MG CAPS Take 1 capsule (400 mg total) by mouth daily at 12 noon. 03/01/22   Albertine Grates, MD  nitroGLYCERIN (NITROSTAT) 0.4 MG SL tablet Place 0.4 mg under the tongue every 5 (five) minutes as needed for chest pain.    [provider]  OXYGEN Inhale 2 L into the lungs daily as needed (for low oxygen).    [provider]  pantoprazole (PROTONIX) 40 MG tablet Take 40 mg by mouth daily.    [provider]  predniSONE (DELTASONE) 10 MG tablet Take 1 tablet (10 mg total) by mouth daily. Take 4 pills daily for 3 days then 3 pills daily for 3 days then 2 pills daily for 3 days then 1 pill daily for 3 days then stop 01/13/23   Burnadette Pop, MD  rosuvastatin (CRESTOR) 20 MG tablet Take 20 mg by mouth daily. 01/15/20   [provider]  tamsulosin (FLOMAX) 0.4 MG CAPS capsule Take 0.4 mg by mouth daily. 03/27/22   [provider]  Vibegron (GEMTESA) 75 MG TABS Take 1 tablet by mouth daily.    [provider]      Allergies    Red dye, Oxycontin [oxycodone hcl], Prednisone, Aspirin, Tape, and Wound dressing adhesive    Review of Systems   Review of Systems  All other systems reviewed and are negative.   Physical Exam Updated Vital Signs BP 132/82   Pulse 86   Temp (!) 97.5 F (36.4 C)   Resp 20   Ht 5\' 3"  (1.6 m)   Wt 42 kg   SpO2 98%   BMI 16.40 kg/m  Physical Exam Vitals and nursing note reviewed.  Constitutional:      Appearance: She is well-developed.  HENT:     Head: Normocephalic and atraumatic.  Eyes:  Extraocular Movements: Extraocular movements intact.  Cardiovascular:     Rate and Rhythm: Normal rate.  Pulmonary:     Effort: Pulmonary effort is normal.  Musculoskeletal:     Cervical back: Normal range of motion and neck supple.     Right lower leg: Edema present.     Left lower leg: Edema present.     Comments: Bilateral lower extremity edema, trace to 1+, worst over the ankle.  Skin:    General: Skin is dry.  Neurological:     Mental Status: She is alert and oriented to person, place, and time.     ED Results / Procedures / Treatments   Labs (all labs ordered are listed, but only abnormal results are displayed) Labs Reviewed  COMPREHENSIVE METABOLIC PANEL - Abnormal; Notable for the following components:      Result Value   CO2 34 (*)     Calcium 8.4 (*)    Total Protein 5.7 (*)    Albumin 3.0 (*)    All other components within normal limits  BRAIN NATRIURETIC PEPTIDE - Abnormal; Notable for the following components:   B Natriuretic Peptide 123.4 (*)    All other components within normal limits  CBC WITH DIFFERENTIAL/PLATELET - Abnormal; Notable for the following components:   WBC 15.6 (*)    RBC 3.77 (*)    Hemoglobin 10.9 (*)    HCT 35.0 (*)    RDW 15.8 (*)    Neutro Abs 9.9 (*)    Lymphs Abs 4.1 (*)    Monocytes Absolute 1.1 (*)    Abs Immature Granulocytes 0.33 (*)    All other components within normal limits    EKG EKG Interpretation  Date/Time:  Tuesday Jan 14 2023 19:05:52 EDT Ventricular Rate:  95 PR Interval:  123 QRS Duration: 102 QT Interval:  361 QTC Calculation: 454 R Axis:   80 Text Interpretation: Sinus rhythm Right atrial enlargement Probable left ventricular hypertrophy Nonspecific ST abnormality No significant change since last tracing Confirmed by Derwood Kaplan [phone]) on 01/14/2023 10:56:47 PM  Radiology No results found.  Procedures Procedures    Medications Ordered in ED Medications  furosemide (LASIX) injection 20 mg (has no administration in time range)    ED Course/ Medical Decision Making/ A&P                             Medical Decision Making Amount and/or Complexity of Data Reviewed Labs: ordered.  Risk Prescription drug management.   67 year old female with history of COPD/emphysema on chronic oxygen, no CHF, renal disease or liver disease comes in with chief complaint of bilateral lower extremity swelling.  Patient has trace pitting edema in both of her lower extremity, worst over the ankle region.  Lung exam are clear.  Differential diagnosis for her includes venous stasis, CHF, nephrotic syndrome, low albumin state.    Plan is to get basic labs including a BNP.  Most likely etiology is venous stasis.  The patient was admitted to the hospital few days  and had fluids given.  11:42 PM Labs are overall reassuring.  BNP is about 100.  I still do not think patient has acute CHF.  Lasix ordered for 5 days.  Advised to follow-up with PCP 1 week.  Final Clinical Impression(s) / ED Diagnoses Final diagnoses:  Peripheral edema    Rx / DC Orders ED Discharge Orders     None  Derwood Kaplan, MD 01/14/23 [id number]

## 2023-01-14 NOTE — Telephone Encounter (Signed)
ATC patient for TOC call.  Left message to call back when available.

## 2023-01-14 NOTE — Telephone Encounter (Signed)
ATC patient for TOC call. Left message with HFU information and to call back when available.

## 2023-01-14 NOTE — Discharge Instructions (Addendum)
We saw you in the emergency department with swelling in your legs.  It is unclear what is causing it, but appears to be less likely because of heart failure.  Take the Lasix that was prescribed. Keep the leg elevated at nighttime.  Compression stockings are recommended during the daytime. Follow-up with your doctor in 1 week.  Return to the emergency room if you start having worsening of your symptoms.

## 2023-01-15 MED ORDER — FUROSEMIDE 20 MG PO TABS: 20.0000 mg | ORAL_TABLET | Freq: Every day | ORAL | 0 refills | Status: DC

## 2023-01-16 NOTE — Progress Notes (Deleted)
Synopsis: Referred for COPD, pulmonary nodule by Jacquelin Hawking, PA-C  Subjective:   PATIENT ID: Tracy Park GENDER: female DOB: September 03, 1956, MRN: [id number]  No chief complaint on file.  66yF with history of CAD, stage IIIA lung cancer sp LULectomy and adjuvant chemo completed 2019, OSA GERD, smoking referred for COPD on 2L O2 baseline, lung nodule  She was recently admitted and discharged 5/6 for AECOPD, back to ED 5/7 for leg swelling, given lasix. She was seen by our consult team.   Previously a patient with Dr. Jerre Simon at [facility]  Otherwise pertinent review of systems is negative.  Past Medical History:  Diagnosis Date   Anginal pain (HCC)    Anxiety    Bipolar disorder (HCC)    Cancer (HCC)    COPD (chronic obstructive pulmonary disease) (HCC)    Dyspnea    Family history of adverse reaction to anesthesia    History of kidney stones    Hydroureteronephrosis 08/16/2021   Hypothyroidism    Lung cancer (HCC)    Myocardial infarction (HCC)    PTSD (post-traumatic stress disorder)    Sleep apnea    Thyroid disease      Family History  Family history unknown: Yes     Past Surgical History:  Procedure Laterality Date   ABDOMINAL HYSTERECTOMY     BACK SURGERY     CYSTOSCOPY W/ URETERAL STENT PLACEMENT Right 05/03/2021   Procedure: CYSTOSCOPY WITH RETROGRADE PYELOGRAM/URETERAL STENT PLACEMENT;  Surgeon: Crist Fat, MD;  Location: [facility];  Service: Urology;  Laterality: Right;   EYE SURGERY     kidney stent     thyroidectomy      Social History   Socioeconomic History   Marital status: Divorced    Spouse name: Not on file   Number of children: Not on file   Years of education: Not on file   Highest education level: Not on file  Occupational History   Not on file  Tobacco Use   Smoking status: Every Day    Packs/day: .15    Types: Cigarettes   Smokeless tobacco: Never  Vaping Use   Vaping Use: Never used  Substance and Sexual Activity   Alcohol  use: Not Currently   Drug use: Not Currently   Sexual activity: Not on file  Other Topics Concern   Not on file  Social History Narrative   Not on file   Social Determinants of Health   Financial Resource Strain: Not on file  Food Insecurity: No Food Insecurity (01/08/2023)   Hunger Vital Sign    Worried About Running Out of Food in the Last Year: Never true    Ran Out of Food in the Last Year: Never true  Transportation Needs: No Transportation Needs (01/08/2023)   PRAPARE - Administrator, Civil Service (Medical): No    Lack of Transportation (Non-Medical): No  Physical Activity: Not on file  Stress: Not on file  Social Connections: Not on file  Intimate Partner Violence: Not At Risk (01/08/2023)   Humiliation, Afraid, Rape, and Kick questionnaire    Fear of Current or Ex-Partner: No    Emotionally Abused: No    Physically Abused: No    Sexually Abused: No     Allergies  Allergen Reactions   Red Dye Hives and Itching   Oxycontin [Oxycodone Hcl] Other (See Comments)    Hallucinations    Prednisone Hives and Nausea And Vomiting   Aspirin Hives  Tape Rash    Prefers paper tape   Wound Dressing Adhesive Rash     Outpatient Medications Prior to Visit  Medication Sig Dispense Refill   albuterol (VENTOLIN HFA) 108 (90 Base) MCG/ACT inhaler Inhale 2 puffs into the lungs every 6 (six) hours as needed for wheezing or shortness of breath. 8 g 1   Budeson-Glycopyrrol-Formoterol (BREZTRI AEROSPHERE) 160-9-4.8 MCG/ACT AERO Inhale 1 puff into the lungs in the morning and at bedtime.     chlorpheniramine-HYDROcodone (TUSSIONEX) 10-8 MG/5ML Take 5 mLs by mouth every 12 (twelve) hours for 5 days. 50 mL 0   furosemide (LASIX) 20 MG tablet Take 1 tablet (20 mg total) by mouth daily. 5 tablet 0   guaiFENesin (MUCINEX) 600 MG 12 hr tablet Take 2 tablets (1,200 mg total) by mouth 2 (two) times daily for 7 days. 28 tablet 0   levothyroxine (SYNTHROID) 125 MCG tablet Take 125 mcg by  mouth daily before breakfast.     Magnesium Oxide 400 MG CAPS Take 1 capsule (400 mg total) by mouth daily at 12 noon. 30 capsule 0   nitroGLYCERIN (NITROSTAT) 0.4 MG SL tablet Place 0.4 mg under the tongue every 5 (five) minutes as needed for chest pain.     OXYGEN Inhale 2 L into the lungs daily as needed (for low oxygen).     pantoprazole (PROTONIX) 40 MG tablet Take 40 mg by mouth daily.     predniSONE (DELTASONE) 10 MG tablet Take 1 tablet (10 mg total) by mouth daily. Take 4 pills daily for 3 days then 3 pills daily for 3 days then 2 pills daily for 3 days then 1 pill daily for 3 days then stop 30 tablet 0   rosuvastatin (CRESTOR) 20 MG tablet Take 20 mg by mouth daily.     tamsulosin (FLOMAX) 0.4 MG CAPS capsule Take 0.4 mg by mouth daily.     Vibegron (GEMTESA) 75 MG TABS Take 1 tablet by mouth daily.     No facility-administered medications prior to visit.       Objective:   Physical Exam:  General appearance: 67 y.o., female, NAD, conversant  Eyes: anicteric sclerae; PERRL, tracking appropriately HENT: NCAT; MMM Neck: Trachea midline; no lymphadenopathy, no JVD Lungs: CTAB, no crackles, no wheeze, with normal respiratory effort CV: RRR, no murmur  Abdomen: Soft, non-tender; non-distended, BS present  Extremities: No peripheral edema, warm Skin: Normal turgor and texture; no rash Psych: Appropriate affect Neuro: Alert and oriented to person and place, no focal deficit     There were no vitals filed for this visit.   on *** LPM *** RA BMI Readings from Last 3 Encounters:  01/14/23 16.40 kg/m  01/08/23 16.39 kg/m  03/30/22 18.25 kg/m   Wt Readings from Last 3 Encounters:  01/14/23 92 lb 9.5 oz (42 kg)  01/08/23 92 lb 8 oz (42 kg)  03/30/22 103 lb (46.7 kg)     CBC    Component Value Date/Time   WBC 15.6 (H) 01/14/2023 2203   RBC 3.77 (L) 01/14/2023 2203   HGB 10.9 (L) 01/14/2023 2203   HCT 35.0 (L) 01/14/2023 2203   PLT 352 01/14/2023 2203   MCV 92.8  01/14/2023 2203   MCH 28.9 01/14/2023 2203   MCHC 31.1 01/14/2023 2203   RDW 15.8 (H) 01/14/2023 2203   LYMPHSABS 4.1 (H) 01/14/2023 2203   MONOABS 1.1 (H) 01/14/2023 2203   EOSABS 0.1 01/14/2023 2203   BASOSABS 0.1 01/14/2023 2203    ***  Chest Imaging: CT Chest 01/10/23 with slight incresae in size of RUL 7mm nodule, mucus plugging RLL. Was mildly hypermetabolic 12/2021  Pulmonary Functions Testing Results:     No data to display           Echocardiogram:   NM stress 05/2021: Essentially normal cardiac perfusion exam. Prognostically this is a low risk scan   PFT 04/25/21: There is a moderate obstructive ventilatory defect.  Normal TLC.  There is a moderate reduction of the diffusing capacity.  There is a significant response to bronchodilators.       Assessment & Plan:    Plan:      Omar Person, MD [facility] 01/16/2023 9:50 AM

## 2023-01-20 ENCOUNTER — Inpatient Hospital Stay: Payer: 59 | Admitting: Student

## 2023-01-20 NOTE — Telephone Encounter (Signed)
Patient was schedule HFU 01/27/23 before hospital d/c home.  Multiple attempts were made to speak with patient for [facility] call with no answer.  Multiple messages were left on patients VM.  Patient had called office back and rescheduled HFU 01/20/23 with Dr. Thora Lance.  Patient was a no show to HFU 01/20/23.

## 2023-01-20 NOTE — Telephone Encounter (Signed)
Patient was contacted several times with multiple messages regarding hospital follow up. Patient was scheduled 01/20/23 with Dr. Thora Lance, before she was discharged from hospital.  Patient did not show for her hospital follow up as scheduled.

## 2023-01-25 ENCOUNTER — Encounter (HOSPITAL_COMMUNITY): Payer: Self-pay | Admitting: *Deleted

## 2023-01-25 ENCOUNTER — Other Ambulatory Visit: Payer: Self-pay

## 2023-01-25 ENCOUNTER — Emergency Department (HOSPITAL_COMMUNITY): Payer: 59

## 2023-01-25 ENCOUNTER — Inpatient Hospital Stay (HOSPITAL_COMMUNITY)
Admission: EM | Admit: 2023-01-25 | Discharge: 2023-01-28 | DRG: 308 | Disposition: A | Discharge status: Home / Self Care | Payer: 59 | Attending: Internal Medicine | Admitting: Internal Medicine

## 2023-01-25 DIAGNOSIS — Z7951 Long term (current) use of inhaled steroids: Secondary | ICD-10-CM

## 2023-01-25 DIAGNOSIS — Z9981 Dependence on supplemental oxygen: Secondary | ICD-10-CM

## 2023-01-25 DIAGNOSIS — F17210 Nicotine dependence, cigarettes, uncomplicated: Secondary | ICD-10-CM | POA: Diagnosis present

## 2023-01-25 DIAGNOSIS — I4891 Unspecified atrial fibrillation: Principal | ICD-10-CM | POA: Diagnosis present

## 2023-01-25 DIAGNOSIS — Z66 Do not resuscitate: Secondary | ICD-10-CM | POA: Diagnosis present

## 2023-01-25 DIAGNOSIS — Z79899 Other long term (current) drug therapy: Secondary | ICD-10-CM

## 2023-01-25 DIAGNOSIS — Z9102 Food additives allergy status: Secondary | ICD-10-CM

## 2023-01-25 DIAGNOSIS — I252 Old myocardial infarction: Secondary | ICD-10-CM | POA: Diagnosis not present

## 2023-01-25 DIAGNOSIS — J9811 Atelectasis: Secondary | ICD-10-CM | POA: Diagnosis present

## 2023-01-25 DIAGNOSIS — I2489 Other forms of acute ischemic heart disease: Secondary | ICD-10-CM | POA: Diagnosis present

## 2023-01-25 DIAGNOSIS — I959 Hypotension, unspecified: Secondary | ICD-10-CM | POA: Diagnosis present

## 2023-01-25 DIAGNOSIS — E890 Postprocedural hypothyroidism: Secondary | ICD-10-CM | POA: Diagnosis present

## 2023-01-25 DIAGNOSIS — F3181 Bipolar II disorder: Secondary | ICD-10-CM | POA: Diagnosis present

## 2023-01-25 DIAGNOSIS — E039 Hypothyroidism, unspecified: Secondary | ICD-10-CM | POA: Diagnosis present

## 2023-01-25 DIAGNOSIS — Z902 Acquired absence of lung [part of]: Secondary | ICD-10-CM

## 2023-01-25 DIAGNOSIS — J9621 Acute and chronic respiratory failure with hypoxia: Secondary | ICD-10-CM | POA: Diagnosis present

## 2023-01-25 DIAGNOSIS — Z886 Allergy status to analgesic agent status: Secondary | ICD-10-CM

## 2023-01-25 DIAGNOSIS — Z7901 Long term (current) use of anticoagulants: Secondary | ICD-10-CM | POA: Diagnosis not present

## 2023-01-25 DIAGNOSIS — E43 Unspecified severe protein-calorie malnutrition: Secondary | ICD-10-CM | POA: Diagnosis present

## 2023-01-25 DIAGNOSIS — J449 Chronic obstructive pulmonary disease, unspecified: Secondary | ICD-10-CM | POA: Diagnosis present

## 2023-01-25 DIAGNOSIS — Z885 Allergy status to narcotic agent status: Secondary | ICD-10-CM

## 2023-01-25 DIAGNOSIS — G4733 Obstructive sleep apnea (adult) (pediatric): Secondary | ICD-10-CM | POA: Diagnosis present

## 2023-01-25 DIAGNOSIS — Z85118 Personal history of other malignant neoplasm of bronchus and lung: Secondary | ICD-10-CM

## 2023-01-25 DIAGNOSIS — Z79890 Hormone replacement therapy (postmenopausal): Secondary | ICD-10-CM | POA: Diagnosis not present

## 2023-01-25 DIAGNOSIS — I2510 Atherosclerotic heart disease of native coronary artery without angina pectoris: Secondary | ICD-10-CM | POA: Diagnosis present

## 2023-01-25 DIAGNOSIS — E876 Hypokalemia: Secondary | ICD-10-CM | POA: Diagnosis present

## 2023-01-25 DIAGNOSIS — E785 Hyperlipidemia, unspecified: Secondary | ICD-10-CM | POA: Diagnosis present

## 2023-01-25 DIAGNOSIS — Z91048 Other nonmedicinal substance allergy status: Secondary | ICD-10-CM

## 2023-01-25 DIAGNOSIS — I480 Paroxysmal atrial fibrillation: Secondary | ICD-10-CM

## 2023-01-25 DIAGNOSIS — R911 Solitary pulmonary nodule: Secondary | ICD-10-CM

## 2023-01-25 DIAGNOSIS — I10 Essential (primary) hypertension: Secondary | ICD-10-CM | POA: Diagnosis present

## 2023-01-25 DIAGNOSIS — Z681 Body mass index (BMI) 19 or less, adult: Secondary | ICD-10-CM

## 2023-01-25 DIAGNOSIS — Z888 Allergy status to other drugs, medicaments and biological substances status: Secondary | ICD-10-CM

## 2023-01-25 DIAGNOSIS — J9611 Chronic respiratory failure with hypoxia: Secondary | ICD-10-CM | POA: Diagnosis present

## 2023-01-25 HISTORY — DX: Paroxysmal atrial fibrillation: I48.0

## 2023-01-25 LAB — BASIC METABOLIC PANEL
Anion gap: 8 (ref 5–15)
BUN: 12 mg/dL (ref 8–23)
CO2: 32 mmol/L (ref 22–32)
Calcium: 9.1 mg/dL (ref 8.9–10.3)
Chloride: 100 mmol/L (ref 98–111)
Creatinine, Ser: 0.58 mg/dL (ref 0.44–1.00)
GFR, Estimated: >60 mL/min (ref >60)
Glucose, Bld: 113 mg/dL — ABNORMAL HIGH (ref 70–99)
Potassium: 3.7 mmol/L (ref 3.5–5.1)
Sodium: 140 mmol/L (ref 135–145)

## 2023-01-25 LAB — BLOOD GAS, VENOUS
Acid-Base Excess: 9.1 mmol/L — ABNORMAL HIGH (ref 0.0–2.0)
Bicarbonate: 35.2 mmol/L — ABNORMAL HIGH (ref 20.0–28.0)
O2 Saturation: 62.7 %
Patient temperature: 37
pCO2, Ven: 53 mmHg (ref 44–60)
pH, Ven: 7.43 (ref 7.25–7.43)
pO2, Ven: 31 mmHg — CL (ref 32–45)

## 2023-01-25 LAB — CBC
HCT: 40.6 % (ref 36.0–46.0)
Hemoglobin: 12.6 g/dL (ref 12.0–15.0)
MCH: 28.6 pg (ref 26.0–34.0)
MCHC: 31 g/dL (ref 30.0–36.0)
MCV: 92.3 fL (ref 80.0–100.0)
Platelets: 299 10*3/uL (ref 150–400)
RBC: 4.4 MIL/uL (ref 3.87–5.11)
RDW: 14.8 % (ref 11.5–15.5)
WBC: 10.6 10*3/uL — ABNORMAL HIGH (ref 4.0–10.5)
nRBC: 0 % (ref 0.0–0.2)

## 2023-01-25 LAB — TROPONIN I (HIGH SENSITIVITY)
Troponin I (High Sensitivity): 9 ng/L (ref <18)
Troponin I (High Sensitivity): 8 ng/L (ref <18)

## 2023-01-25 MED ORDER — IOHEXOL 350 MG/ML SOLN
75.0000 mL | Freq: Once | INTRAVENOUS | Status: AC | PRN
  Administered 2023-01-25: 75 mL via INTRAVENOUS

## 2023-01-25 MED ORDER — APIXABAN 5 MG PO TABS
5.0000 mg | ORAL_TABLET | Freq: Two times a day (BID) | ORAL | Status: DC
  Administered 2023-01-25 – 2023-01-28 (×6): 5 mg via ORAL
  Filled 2023-01-25 (×6): qty 1

## 2023-01-25 MED ORDER — MORPHINE SULFATE (PF) 4 MG/ML IV SOLN
4.0000 mg | Freq: Once | INTRAVENOUS | Status: AC
  Administered 2023-01-25: 4 mg via INTRAVENOUS
  Filled 2023-01-25: qty 1

## 2023-01-25 MED ORDER — DILTIAZEM HCL-DEXTROSE 125-5 MG/125ML-% IV SOLN (PREMIX)
5.0000 mg/h | INTRAVENOUS | Status: DC
  Administered 2023-01-25: 5 mg/h via INTRAVENOUS
  Filled 2023-01-25: qty 125

## 2023-01-25 MED ORDER — PNEUMOCOCCAL 20-VAL CONJ VACC 0.5 ML IM SUSY
0.5000 mL | PREFILLED_SYRINGE | INTRAMUSCULAR | Status: DC
  Filled 2023-01-25: qty 0.5

## 2023-01-25 MED ORDER — TAMSULOSIN HCL 0.4 MG PO CAPS
0.4000 mg | ORAL_CAPSULE | Freq: Every day | ORAL | Status: DC
  Administered 2023-01-26 – 2023-01-28 (×3): 0.4 mg via ORAL
  Filled 2023-01-25 (×3): qty 1

## 2023-01-25 MED ORDER — LEVALBUTEROL HCL 0.63 MG/3ML IN NEBU
0.6300 mg | INHALATION_SOLUTION | Freq: Once | RESPIRATORY_TRACT | Status: AC
  Administered 2023-01-26: 0.63 mg via RESPIRATORY_TRACT
  Filled 2023-01-25: qty 3

## 2023-01-25 MED ORDER — ROSUVASTATIN CALCIUM 20 MG PO TABS
20.0000 mg | ORAL_TABLET | Freq: Every day | ORAL | Status: DC
  Administered 2023-01-26 – 2023-01-28 (×3): 20 mg via ORAL
  Filled 2023-01-25 (×3): qty 1

## 2023-01-25 MED ORDER — LEVOTHYROXINE SODIUM 25 MCG PO TABS
125.0000 ug | ORAL_TABLET | Freq: Every day | ORAL | Status: DC
  Administered 2023-01-26 – 2023-01-28 (×3): 125 ug via ORAL
  Filled 2023-01-25 (×3): qty 1

## 2023-01-25 MED ORDER — LACTATED RINGERS IV BOLUS
1000.0000 mL | Freq: Once | INTRAVENOUS | Status: AC
  Administered 2023-01-25: 1000 mL via INTRAVENOUS

## 2023-01-25 MED ORDER — PANTOPRAZOLE SODIUM 40 MG PO TBEC
40.0000 mg | DELAYED_RELEASE_TABLET | Freq: Every day | ORAL | Status: DC
  Administered 2023-01-26 – 2023-01-28 (×3): 40 mg via ORAL
  Filled 2023-01-25 (×3): qty 1

## 2023-01-25 MED ORDER — MAGNESIUM OXIDE -MG SUPPLEMENT 400 (240 MG) MG PO TABS
400.0000 mg | ORAL_TABLET | Freq: Every day | ORAL | Status: DC
  Administered 2023-01-26 – 2023-01-28 (×3): 400 mg via ORAL
  Filled 2023-01-25 (×3): qty 1

## 2023-01-25 MED ORDER — CHLORHEXIDINE GLUCONATE CLOTH 2 % EX PADS
6.0000 | MEDICATED_PAD | Freq: Every day | CUTANEOUS | Status: DC
  Administered 2023-01-27 – 2023-01-28 (×3): 6 via TOPICAL

## 2023-01-25 MED ORDER — UMECLIDINIUM BROMIDE 62.5 MCG/ACT IN AEPB
1.0000 | INHALATION_SPRAY | Freq: Every day | RESPIRATORY_TRACT | Status: DC
  Administered 2023-01-26 – 2023-01-28 (×3): 1 via RESPIRATORY_TRACT
  Filled 2023-01-25: qty 7

## 2023-01-25 MED ORDER — MOMETASONE FURO-FORMOTEROL FUM 100-5 MCG/ACT IN AERO
2.0000 | INHALATION_SPRAY | Freq: Two times a day (BID) | RESPIRATORY_TRACT | Status: DC
  Administered 2023-01-26 – 2023-01-28 (×5): 2 via RESPIRATORY_TRACT
  Filled 2023-01-25: qty 8.8

## 2023-01-25 MED ORDER — DILTIAZEM LOAD VIA INFUSION
10.0000 mg | Freq: Once | INTRAVENOUS | Status: AC
  Administered 2023-01-25: 10 mg via INTRAVENOUS
  Filled 2023-01-25: qty 10

## 2023-01-25 MED ORDER — IPRATROPIUM BROMIDE 0.02 % IN SOLN
0.5000 mg | Freq: Once | RESPIRATORY_TRACT | Status: AC
  Administered 2023-01-26: 0.5 mg via RESPIRATORY_TRACT
  Filled 2023-01-25: qty 2.5

## 2023-01-25 MED ORDER — BUDESON-GLYCOPYRROL-FORMOTEROL 160-9-4.8 MCG/ACT IN AERO: 1.0000 | INHALATION_SPRAY | Freq: Two times a day (BID) | RESPIRATORY_TRACT | Status: DC

## 2023-01-25 MED ORDER — LOSARTAN POTASSIUM 25 MG PO TABS
12.5000 mg | ORAL_TABLET | Freq: Every day | ORAL | Status: DC
  Filled 2023-01-25: qty 0.5

## 2023-01-25 NOTE — ED Notes (Signed)
Pt going in and out of Afib. HR 80s -140. MD aware

## 2023-01-25 NOTE — Discharge Instructions (Signed)

## 2023-01-25 NOTE — Assessment & Plan Note (Addendum)
-  newly diagnosed atrial fibrillation -troponin flat at 9 and then 8  -soon to be 67 with CHA2D2VASc score of 2 so cardiology recommends starting anticoagulation. Eliquis started in ED. Normally ambulates with cane and no hx of recurrent falls or GI bleed.  -continue on diltiazem infusion with HR goal <100 -BP remains stable -Check TSH with hypothyroidism history -obtain echocardiogram

## 2023-01-25 NOTE — H&P (Signed)
History and Physical    Patient: Tracy Park:[id number] DOB: 1956/04/20 DOA: 01/25/2023 DOS: the patient was seen and examined on 01/25/2023 PCP: Jacquelin Hawking, PA-C  Patient coming from: Home  Chief Complaint:  Chief Complaint  Patient presents with   Chest Pain   HPI: Tracy Park is a 67 y.o. female with medical history significant of COPD with chronic hypoxic respiratory failure on 2L, lung cancer s/p LUL resection in remission, OSA, CAD, HLD, anxiety, depression, Bipolar Type 2 who presents with chest pain.   Had acute mid-sternal chest pain that was dull and then sharp starting yesterday while watching TV. Pain is constant without alleviating or aggravating factors. Associated with increasing shortness of breath but was able to remain on baseline 2L via [place]. No worse chronic cough. Continues to smoke half a pack daily. No hx of significant cardiac history. No nausea or vomiting. Finally came into ED because daughter told her to.   In the ED, she was afebrile, normotensive and initially in normal sinus rhythm. However upon ambulation noted to be in atrial fibrillation with RVR with HR up to 170.  Troponin flat and reassuring at 9 and 8.   Mild leukocytosis of 10.6 and no anemia Hgb of 12.6.  No significant electrolyte abnormalities with Na of 140, K of 3.7 and creatinine of 0.58.   CTA chest negative for PE, Increased patchy consolidation/atelectasis in the posterior right upper lobe in the area of the previously noted 7 mm nodule. This could be infectious/inflammatory, but is indeterminate. Mild diffuse bronchial wall thickening and scattered centrilobular micro nodules, likely infectious/inflammatory. Mucous plugging in the bilateral lower lobe bronchi.Advanced emphysema. Postoperative changes of left upper lobectomy.  ED physician discussed with cardiology Dr. Welton Flakes recommended starting anticoagulation as she will soon be 67 giving her a CHA2DS2-VASc score of 2 and  she was also started on IV diltiazem infusion.     Review of Systems: As mentioned in the history of present illness. All other systems reviewed and are negative. Past Medical History:  Diagnosis Date   Anginal pain (HCC)    Anxiety    Bipolar disorder (HCC)    Cancer (HCC)    COPD (chronic obstructive pulmonary disease) (HCC)    Dyspnea    Family history of adverse reaction to anesthesia    History of kidney stones    Hydroureteronephrosis 08/16/2021   Hypothyroidism    Lung cancer (HCC)    Myocardial infarction (HCC)    PTSD (post-traumatic stress disorder)    Sleep apnea    Thyroid disease    Past Surgical History:  Procedure Laterality Date   ABDOMINAL HYSTERECTOMY     BACK SURGERY     CYSTOSCOPY W/ URETERAL STENT PLACEMENT Right 05/03/2021   Procedure: CYSTOSCOPY WITH RETROGRADE PYELOGRAM/URETERAL STENT PLACEMENT;  Surgeon: Crist Fat, MD;  Location: [facility];  Service: Urology;  Laterality: Right;   EYE SURGERY     kidney stent     thyroidectomy     Social History:  reports that she has been smoking cigarettes. She has been smoking an average of .15 packs per day. She has never used smokeless tobacco. She reports that she does not currently use alcohol. She reports that she does not currently use drugs.  Allergies  Allergen Reactions   Red Dye Hives and Itching   Oxycontin [Oxycodone Hcl] Other (See Comments)    Hallucinations    Prednisone Hives and Nausea And Vomiting   Aspirin Hives  Tape Rash    Prefers paper tape   Wound Dressing Adhesive Rash    Family History  Family history unknown: Yes    Prior to Admission medications   Medication Sig Start Date End Date Taking? Authorizing Provider  albuterol (VENTOLIN HFA) 108 (90 Base) MCG/ACT inhaler Inhale 2 puffs into the lungs every 6 (six) hours as needed for wheezing or shortness of breath. 01/13/23  Yes Burnadette Pop, MD  Budeson-Glycopyrrol-Formoterol (BREZTRI AEROSPHERE) 160-9-4.8 MCG/ACT AERO  Inhale 1 puff into the lungs in the morning and at bedtime.   Yes [provider]  levothyroxine (SYNTHROID) 125 MCG tablet Take 125 mcg by mouth daily before breakfast.   Yes [provider]  losartan (COZAAR) 25 MG tablet Take 12.5 mg by mouth daily. 01/25/23  Yes [provider]  Magnesium Oxide 400 MG CAPS Take 1 capsule (400 mg total) by mouth daily at 12 noon. 03/01/22  Yes Albertine Grates, MD  nitroGLYCERIN (NITROSTAT) 0.4 MG SL tablet Place 0.4 mg under the tongue every 5 (five) minutes as needed for chest pain.   Yes [provider]  OXYGEN Inhale 2 L into the lungs daily as needed (for low oxygen).   Yes [provider]  pantoprazole (PROTONIX) 40 MG tablet Take 40 mg by mouth daily.   Yes [provider]  rosuvastatin (CRESTOR) 20 MG tablet Take 20 mg by mouth daily. 01/15/20  Yes [provider]  tamsulosin (FLOMAX) 0.4 MG CAPS capsule Take 0.4 mg by mouth daily. 03/27/22  Yes [provider]  furosemide (LASIX) 20 MG tablet Take 1 tablet (20 mg total) by mouth daily. Patient not taking: Reported on 01/25/2023 01/15/23   Long, Arlyss Repress, MD  predniSONE (DELTASONE) 10 MG tablet Take 1 tablet (10 mg total) by mouth daily. Take 4 pills daily for 3 days then 3 pills daily for 3 days then 2 pills daily for 3 days then 1 pill daily for 3 days then stop Patient not taking: Reported on 01/25/2023 01/13/23   Burnadette Pop, MD    Physical Exam: Vitals:   01/25/23 2215 01/25/23 2230 01/25/23 2322 01/25/23 2337  BP:  101/78 99/69 111/74  Pulse: (!) 104 85 (!) 104 100  Resp: (!) 21 (!) 23 (!) 22 20  Temp:   97.8 F (36.6 C)   TempSrc:   Oral   SpO2: 96% 97% 96% 97%  Weight:   43.7 kg   Height:       Constitutional: NAD, calm, comfortable, elderly female appearing older than stated age sitting upright in bed Eyes: lids and conjunctivae normal ENMT: Mucous membranes are moist.  Neck: normal, supple Respiratory: diffusing wheezing but  no crackles. Normal respiratory effort. No accessory muscle use. On 2L via [place].  Cardiovascular: Regular rate and rhythm, no murmurs / rubs / gallops. No extremity edema.  Abdomen:Soft, non-tender, non-distended Musculoskeletal: no clubbing / cyanosis. No joint deformity upper and lower extremities. Good ROM, no contractures. Normal muscle tone.  Skin: no rashes, lesions, ulcers. No induration Neurologic: CN 2-12 grossly intact.  Psychiatric: Normal judgment and insight. Alert and oriented x 3. Normal mood.   Data Reviewed:  See HPI  Assessment and Plan: * Atrial fibrillation with RVR (HCC) -newly diagnosed atrial fibrillation -troponin flat at 9 and then 8  -soon to be 67 with CHA2D2VASc score of 2 so cardiology recommends starting anticoagulation. Eliquis started in ED. Normally ambulates with cane and no hx of recurrent falls or GI bleed.  -continue on  diltiazem infusion with HR goal <100 -BP remains stable -Check TSH with hypothyroidism history -obtain echocardiogram  History of lung cancer -s/p left upper lobectomy in remission -noted to have new pulmonary nodules in recent CT scan. She is aware and has been following with her pulmonologist at [facility] to get schedule follow up scans.  Hypothyroidism (acquired) -continue levothyroxine  HTN (hypertension) -continue with Losartan in the morning -on IV diltiazem infusion overnight  COPD (chronic obstructive pulmonary disease) (HCC) -not in exacerbation but has wheezing on exam. Will give one time ipratropium-xopenex (due to tachycardiac)  -continue home bronchodilator   Pulmonary nodule - CTA chest showed Increased patchy consolidation/atelectasis in the posterior right upper lobe in the area of the previously noted 7 mm nodule. This could be infectious/inflammatory, but is indeterminate. Continued close interval follow-up is recommended with noncontrast chest CT in 3-6 months.  Chronic respiratory failure with hypoxia  (HCC) -remains on baseline 2L       Advance Care Planning:   Code Status: DNR -confirmed with pt at bedside although she reports her 2 daughters are against it  Consults: none  Family Communication: none at bedside  Severity of Illness: The appropriate patient status for this patient is INPATIENT. Inpatient status is judged to be reasonable and necessary in order to provide the required intensity of service to ensure the patient's safety. The patient's presenting symptoms, physical exam findings, and initial radiographic and laboratory data in the context of their chronic comorbidities is felt to place them at high risk for further clinical deterioration. Furthermore, it is not anticipated that the patient will be medically stable for discharge from the hospital within 2 midnights of admission.   * I certify that at the point of admission it is my clinical judgment that the patient will require inpatient hospital care spanning beyond 2 midnights from the point of admission due to high intensity of service, high risk for further deterioration and high frequency of surveillance required.*  Author: Anselm Jungling, DO 01/25/2023 11:58 PM  For on call review [email or web address].

## 2023-01-25 NOTE — Assessment & Plan Note (Signed)
-   CTA chest showed Increased patchy consolidation/atelectasis in the posterior right upper lobe in the area of the previously noted 7 mm nodule. This could be infectious/inflammatory, but is indeterminate. Continued close interval follow-up is recommended with noncontrast chest CT in 3-6 months.

## 2023-01-25 NOTE — ED Notes (Signed)
Patient transported to x-ray. ?

## 2023-01-25 NOTE — Assessment & Plan Note (Addendum)
-  remains on baseline 2L

## 2023-01-25 NOTE — ED Triage Notes (Signed)
Pt has lung cancer, yesterday left sided chest pain under breast, pressure and pain. Headache and shob.

## 2023-01-25 NOTE — Assessment & Plan Note (Signed)
-  not in exacerbation but has wheezing on exam. Will give one time ipratropium-xopenex (due to tachycardiac)  -continue home bronchodilator

## 2023-01-25 NOTE — Assessment & Plan Note (Addendum)
-  s/p left upper lobectomy in remission -noted to have new pulmonary nodules in recent CT scan. She is aware and has been following with her pulmonologist at [facility] to get schedule follow up scans.

## 2023-01-25 NOTE — ED Notes (Signed)
ICU unable to take pt at this time- will notify ED when RN arrives to take over care

## 2023-01-25 NOTE — Assessment & Plan Note (Signed)
-  continue with Losartan in the morning -on IV diltiazem infusion overnight

## 2023-01-25 NOTE — ED Provider Notes (Signed)
[facility] AT [facility] Provider Note   CSN: [id number] Arrival date & time: 01/25/23  1759     History Chief Complaint  Patient presents with   Chest Pain    HPI Tracy Park is a 67 y.o. female presenting for chief complaint chest pain.  States that she has had episodic chest pain over the past 72 hours.  It became more severe tonight was very sharp earlier in the day and now seems to be improving.  Denies fevers chills, nausea vomiting, syncope or shortness of breath.  No known sick contacts..   Patient's recorded medical, surgical, social, medication list and allergies were reviewed in the Snapshot window as part of the initial history.   Review of Systems   Review of Systems  Constitutional:  Negative for chills and fever.  HENT:  Negative for ear pain and sore throat.   Eyes:  Negative for pain and visual disturbance.  Respiratory:  Negative for cough and shortness of breath.   Cardiovascular:  Positive for palpitations. Negative for chest pain.  Gastrointestinal:  Negative for abdominal pain and vomiting.  Genitourinary:  Negative for dysuria and hematuria.  Musculoskeletal:  Negative for arthralgias and back pain.  Skin:  Negative for color change and rash.  Neurological:  Negative for seizures and syncope.  All other systems reviewed and are negative.   Physical Exam Updated Vital Signs BP (!) 128/90 (BP Location: Right Arm)   Pulse (!) 110   Temp 98 F (36.7 C) (Oral)   Resp (!) 26   Ht 5\' 3"  (1.6 m)   Wt 47.6 kg   SpO2 93%   BMI 18.60 kg/m  Physical Exam Vitals and nursing note reviewed.  Constitutional:      General: She is not in acute distress.    Appearance: She is well-developed.  HENT:     Head: Normocephalic and atraumatic.  Eyes:     Conjunctiva/sclera: Conjunctivae normal.  Cardiovascular:     Rate and Rhythm: Normal rate and regular rhythm.     Heart sounds: No murmur heard. Pulmonary:     Effort:  Pulmonary effort is normal. No respiratory distress.     Breath sounds: Normal breath sounds.  Abdominal:     Palpations: Abdomen is soft.     Tenderness: There is no abdominal tenderness.  Musculoskeletal:        General: No swelling.     Cervical back: Neck supple.  Skin:    General: Skin is warm and dry.     Capillary Refill: Capillary refill takes less than 2 seconds.  Neurological:     Mental Status: She is alert.  Psychiatric:        Mood and Affect: Mood normal.      ED Course/ Medical Decision Making/ A&P    Procedures .Critical Care  Performed by: Glyn Ade, MD Authorized by: Glyn Ade, MD   Critical care provider statement:    Critical care time (minutes):  45   Critical care was necessary to treat or prevent imminent or life-threatening deterioration of the following conditions:  Cardiac failure (Tachycardia >170)   Critical care was time spent personally by me on the following activities:  Development of treatment plan with patient or surrogate, discussions with consultants, evaluation of patient's response to treatment, examination of patient, ordering and review of laboratory studies, ordering and review of radiographic studies, ordering and performing treatments and interventions, pulse oximetry, re-evaluation of patient's condition and review of  old charts   Care discussed with: admitting provider      Medications Ordered in ED Medications - No data to display Medical Decision Making: [name] is a 67 y.o. female who presented to the ED today with chest pain, detailed above.  Based on patient's comorbidities, patient has a heart score of 3.    Patient's presentation is complicated by their history of COPD on oxygen.  Patient placed on continuous vitals and telemetry monitoring while in ED which was reviewed periodically.  Complete initial physical exam performed, notably the patient was in no acute distress and not tachycardic initially.    Reviewed and confirmed nursing documentation for past medical history, family history, social history.    Initial Assessment: With the patient's presentation of left-sided chest pain, most likely diagnosis is musculoskeletal chest pain versus GERD, although ACS remains on the differential. Other diagnoses were considered including (but not limited to) pulmonary embolism, community-acquired pneumonia, aortic dissection, pneumothorax, underlying bony abnormality, anemia. These are considered less likely due to history of present illness and physical exam findings.     This is most consistent with an acute life/limb threatening illness complicated by underlying chronic conditions.   Initial Plan: Evaluate for ACS with single troponin and EKG evaluated as below  Evaluate for dissection, bony abnormality, or pneumonia with chest x-ray and screening laboratory evaluation including CBC, BMP  Further evaluation for pulmonary embolism  indicated at this time based on patient's PERC and Wells score.  Will proceed with CTA PE study Further evaluation for Thoracic Aortic Dissection not indicated at this time based on patient's clinical history and PE findings.   Initial Study Results: EKG was reviewed independently. Rate, rhythm, axis, intervals all examined and without medically relevant abnormality. ST segments without concerns for elevations.    Laboratory  Single troponin demonstrated no acute abnormality  CBC and BMP without obvious metabolic or inflammatory abnormalities requiring further evaluation   Radiology  CT Angio Chest Pulmonary Embolism (PE) W or WO Contrast  Result Date: 01/25/2023 CLINICAL DATA:  Chest pain, PE suspected EXAM: CT ANGIOGRAPHY CHEST WITH CONTRAST TECHNIQUE: Multidetector CT imaging of the chest was performed using the standard protocol during bolus administration of intravenous contrast. Multiplanar CT image reconstructions and MIPs were obtained to evaluate the  vascular anatomy. RADIATION DOSE REDUCTION: This exam was performed according to the departmental dose-optimization program which includes automated exposure control, adjustment of the mA and/or kV according to patient size and/or use of iterative reconstruction technique. CONTRAST:  75mL OMNIPAQUE IOHEXOL 350 MG/ML SOLN COMPARISON:  Chest radiograph 01/25/2023 and CT chest 01/10/2023 FINDINGS: Cardiovascular: Satisfactory opacification of the pulmonary arteries to the segmental level. No pulmonary embolism. Normal heart size. No pericardial effusion. Aortic and coronary artery atherosclerotic calcification. No aortic aneurysm or dissection. Mediastinum/Nodes: Unremarkable esophagus. Borderline enlarged 9 mm short axis right hilar node (4/59) is not significantly changed. Lungs/Pleura: Advanced emphysema. Postoperative changes of left upper lobectomy. Mild diffuse bronchial wall thickening and scattered centrilobular micro nodules. Mucous plugging in the bilateral lower lobe bronchi. Increased patchy consolidation/atelectasis in the posterior right upper lobe in the area of the previously noted 7 mm nodule. Scarring/atelectasis right lower lobe. 4 mm nodule in the medial right upper lobe (12/69 is unchanged from 2021 and should be benign. This nodule now measures approximately 9 mm. Similar atelectasis/scarring in the left upper lung. No pleural effusion or pneumothorax. Upper Abdomen: No acute abnormality. Musculoskeletal: No acute fracture. Review of the MIP images confirms the above findings.  IMPRESSION: 1. No evidence of acute pulmonary embolism. 2. Increased patchy consolidation/atelectasis in the posterior right upper lobe in the area of the previously noted 7 mm nodule. This could be infectious/inflammatory, but is indeterminate. Continued close interval follow-up is recommended with noncontrast chest CT in 3-6 months. 3. Mild diffuse bronchial wall thickening and scattered centrilobular micro nodules, likely  infectious/inflammatory. Mucous plugging in the bilateral lower lobe bronchi. 4. Advanced emphysema. Postoperative changes of left upper lobectomy. Aortic Atherosclerosis (ICD10-I70.0) and Emphysema (ICD10-J43.9). Electronically Signed   By: Minerva Fester M.D.   On: 01/25/2023 20:07   DG Chest 2 View  Result Date: 01/25/2023 CLINICAL DATA:  Short of breath, history of left lung cancer EXAM: CHEST - 2 VIEW COMPARISON:  01/10/2023 FINDINGS: Frontal and lateral views of the chest demonstrate a stable cardiac silhouette. Stable postsurgical changes from left upper lobectomy, with pleural and parenchymal scarring and volume loss within the left upper hemithorax. Background emphysema again noted. No new consolidation, effusion, or pneumothorax. No acute bony abnormalities. IMPRESSION: 1. Postsurgical and post therapeutic changes related to prior left upper lobectomy. 2. Stable emphysema. 3. No acute airspace disease. 4. Please refer to previous chest CT report 01/10/2023 for a subcentimeter right upper lobe pulmonary nodule requiring follow-up. This is not well visualized by x-ray. Electronically Signed   By: Sharlet Salina M.D.   On: 01/25/2023 18:33   CT CHEST WO CONTRAST  Result Date: 01/10/2023 CLINICAL DATA:  Non-small cell lung cancer. Status post left upper lobectomy. EXAM: CT CHEST WITHOUT CONTRAST TECHNIQUE: Multidetector CT imaging of the chest was performed following the standard protocol without IV contrast. RADIATION DOSE REDUCTION: This exam was performed according to the departmental dose-optimization program which includes automated exposure control, adjustment of the mA and/or kV according to patient size and/or use of iterative reconstruction technique. COMPARISON:  CT chest abdomen and pelvis 03/30/2022 FINDINGS: Cardiovascular: No significant vascular findings. Normal heart size. No pericardial effusion. There are atherosclerotic calcifications is of the aorta. Mediastinum/Nodes: No enlarged  mediastinal or axillary lymph nodes. Thyroid gland, trachea, and esophagus demonstrate no significant findings. Lungs/Pleura: Moderate emphysema is again seen. Left upper lobectomy changes are again seen with pleural thickening in the left lung apex. Previously identified left upper lung airspace disease near the apex has resolved in the interval. There is no new focal lung infiltrate. Scattered areas of scarring or atelectasis are stable. There is some plugging of right lower lobe bronchi, new from prior. Patchy slightly nodular density in the posterior right upper lobe measures 7 mm (previously 5 mm). No new pulmonary nodules are seen. No pleural effusion. Upper Abdomen: No acute abnormality. Musculoskeletal: No chest wall mass or suspicious bone lesions identified. Cervical disc spacer present. IMPRESSION: 1. Left upper lobectomy changes. No evidence for local recurrence. 2. 7 mm right upper lobe pulmonary nodule has slightly increased in size when compared to prior. Continued close interval follow-up recommended. Non-contrast chest CT in 3-6 months is suggested. 3. New plugging of right lower lobe bronchi, likely infectious/inflammatory. Aortic Atherosclerosis (ICD10-I70.0) and Emphysema (ICD10-J43.9). Electronically Signed   By: Darliss Cheney M.D.   On: 01/10/2023 19:26   DG CHEST PORT 1 VIEW  Result Date: 01/10/2023 CLINICAL DATA:  Wheezing EXAM: PORTABLE CHEST 1 VIEW COMPARISON:  01/07/2023 and older FINDINGS: Once again volume loss along the left hemithorax from prior resection. Apical pleural thickening and retraction. Shift of the mediastinum from right to left. Right lung is clear. Hyperinflated. No consolidation or pneumothorax. No edema. Normal cardiopericardial  silhouette IMPRESSION: Stable postsurgical changes along the left hemithorax with volume loss and apical pleural thickening. Hyperinflation Electronically Signed   By: Karen Kays M.D.   On: 01/10/2023 12:22   DG Chest Port 1  View  Result Date: 01/07/2023 CLINICAL DATA:  Shortness of breath EXAM: PORTABLE CHEST 1 VIEW COMPARISON:  04/01/2022 FINDINGS: Cardiac shadow is stable. Chronic scarring is noted in the left apex with pleural capping. No new focal infiltrate or effusion is seen. No bony abnormality is noted. IMPRESSION: Chronic scarring in the left apex.  No acute abnormality noted. Electronically Signed   By: Alcide Clever M.D.   On: 01/07/2023 23:01    Final Assessment and Plan: I was emergently called back to patient's bedside for tachycardia.  Patient had spontaneously become tachycardic to the 170s.  Monitor demonstrated A-fib with RVR.  EKG captured the event.  It happened as a patient got up to go to the bathroom she started having immediate onset worsening palpitations and chest discomfort again. Given tachycardia to the 170s, IV diltiazem was initiated and infusion was continued.  Consulted cardiology Dr. Claire Shown treatment plan.  He agreed with diltiazem and recommended initiation of apixaban due to patient's age and CHA2DS2-VASc score. He agreed with admission for further diagnostic care management titration onto alternative rate control agents.  Disposition:   Based on the above findings, I believe this patient is stable for admission.    Patient/family educated about specific findings on our evaluation and explained exact reasons for admission.  Patient/family educated about clinical situation and time was allowed to answer questions.   Admission team communicated with and agreed with need for admission. Patient admitted. Patient ready to move at this time.     Emergency Department Medication Summary:   Medications  diltiazem (CARDIZEM) 1 mg/mL load via infusion 10 mg (10 mg Intravenous Bolus from Bag 01/25/23 2114)    And  diltiazem (CARDIZEM) 125 mg in dextrose 5% 125 mL (1 mg/mL) infusion (7.5 mg/hr Intravenous Rate/Dose Change 01/25/23 2146)  apixaban (ELIQUIS) tablet 5 mg (5 mg Oral Given 01/25/23  2121)  lactated ringers bolus 1,000 mL (0 mLs Intravenous Stopped 01/25/23 2030)  morphine (PF) 4 MG/ML injection 4 mg (4 mg Intravenous Given 01/25/23 1859)  iohexol (OMNIPAQUE) 350 MG/ML injection 75 mL (75 mLs Intravenous Contrast Given 01/25/23 1937)                   Clinical Impression: No diagnosis found.   Data Unavailable   Final Clinical Impression(s) / ED Diagnoses Final diagnoses:  None    Rx / DC Orders ED Discharge Orders     None         Glyn Ade, MD 01/25/23 2219

## 2023-01-25 NOTE — ED Notes (Signed)
ED TO INPATIENT HANDOFF REPORT  ED Nurse Name and Phone #: Darnell Level Name/Age/Gender Tracy Park 67 y.o. female Room/Bed: WA05/WA05  Code Status   Code Status: Prior  Home/SNF/Other Home Patient oriented to: self, place, time, and situation Is this baseline? Yes   Triage Complete: Triage complete  Chief Complaint Atrial fibrillation with RVR (HCC) [I48.91]  Triage Note Pt has lung cancer, yesterday left sided chest pain under breast, pressure and pain. Headache and shob.   Allergies Allergies  Allergen Reactions   Red Dye Hives and Itching   Oxycontin [Oxycodone Hcl] Other (See Comments)    Hallucinations    Prednisone Hives and Nausea And Vomiting   Aspirin Hives   Tape Rash    Prefers paper tape   Wound Dressing Adhesive Rash    Level of Care/Admitting Diagnosis ED Disposition     ED Disposition  Admit   Condition  --   Comment  Hospital Area: [facility] [[id number]]  Level of Care: Stepdown [14]  Admit to SDU based on following criteria: Cardiac Instability:  Patients experiencing chest pain, unconfirmed MI and stable, arrhythmias and CHF requiring medical management and potentially compromising patient's stability  May admit patient to [facility] or [facility] if equivalent level of care is available:: No  Covid Evaluation: Asymptomatic - no recent exposure (last 10 days) testing not required  Diagnosis: Atrial fibrillation with RVR [facility]) [[id number]]  Admitting Physician: Anselm Jungling [[id number]]  Attending Physician: Anselm Jungling [[id number]]  Certification:: I certify this patient will need inpatient services for at least 2 midnights  Estimated Length of Stay: 2          B Medical/Surgery History Past Medical History:  Diagnosis Date   Anginal pain (HCC)    Anxiety    Bipolar disorder (HCC)    Cancer (HCC)    COPD (chronic obstructive pulmonary disease) (HCC)    Dyspnea    Family history of adverse reaction to anesthesia     History of kidney stones    Hydroureteronephrosis 08/16/2021   Hypothyroidism    Lung cancer (HCC)    Myocardial infarction (HCC)    PTSD (post-traumatic stress disorder)    Sleep apnea    Thyroid disease    Past Surgical History:  Procedure Laterality Date   ABDOMINAL HYSTERECTOMY     BACK SURGERY     CYSTOSCOPY W/ URETERAL STENT PLACEMENT Right 05/03/2021   Procedure: CYSTOSCOPY WITH RETROGRADE PYELOGRAM/URETERAL STENT PLACEMENT;  Surgeon: Crist Fat, MD;  Location: [facility];  Service: Urology;  Laterality: Right;   EYE SURGERY     kidney stent     thyroidectomy       A IV Location/Drains/Wounds Patient Lines/Drains/Airways Status     Active Line/Drains/Airways     Name Placement date Placement time Site Days   Peripheral IV 01/25/23 20 G Left;Posterior Forearm 01/25/23  1825  Forearm  less than 1   Peripheral IV 01/25/23 20 G Left Antecubital 01/25/23  1857  Antecubital  less than 1            Intake/Output Last 24 hours  Intake/Output Summary (Last 24 hours) at 01/25/2023 2221 Last data filed at 01/25/2023 2030 Gross per 24 hour  Intake 1000 ml  Output --  Net 1000 ml    Labs/Imaging Results for orders placed or performed during the hospital encounter of 01/25/23 (from the past 48 hour(s))  Basic metabolic panel     Status: Abnormal  Collection Time: 01/25/23  6:26 PM  Result Value Ref Range   Sodium 140 135 - 145 mmol/L   Potassium 3.7 3.5 - 5.1 mmol/L   Chloride 100 98 - 111 mmol/L   CO2 32 22 - 32 mmol/L   Glucose, Bld 113 (H) 70 - 99 mg/dL    Comment: Glucose reference range applies only to samples taken after fasting for at least 8 hours.   BUN 12 8 - 23 mg/dL   Creatinine, Ser 1.61 0.44 - 1.00 mg/dL   Calcium 9.1 8.9 - 09.6 mg/dL   GFR, Estimated >04 >54 mL/min    Comment: (NOTE) Calculated using the CKD-EPI Creatinine Equation (2021)    Anion gap 8 5 - 15    Comment: Performed at [facility], [address]  [place], [place] [address]  CBC     Status: Abnormal   Collection Time: 01/25/23  6:26 PM  Result Value Ref Range   WBC 10.6 (H) 4.0 - 10.5 K/uL   RBC 4.40 3.87 - 5.11 MIL/uL   Hemoglobin 12.6 12.0 - 15.0 g/dL   HCT 91.4 78.2 - 95.6 %   MCV 92.3 80.0 - 100.0 fL   MCH 28.6 26.0 - 34.0 pg   MCHC 31.0 30.0 - 36.0 g/dL   RDW 21.3 08.6 - 57.8 %   Platelets 299 150 - 400 K/uL   nRBC 0.0 0.0 - 0.2 %    Comment: Performed at [facility], [address], [place], [place] [address]  Troponin I (High Sensitivity)     Status: None   Collection Time: 01/25/23  6:26 PM  Result Value Ref Range   Troponin I (High Sensitivity) 9 <18 ng/L    Comment: (NOTE) Elevated high sensitivity troponin I (hsTnI) values and significant  changes across serial measurements may suggest ACS but many other  chronic and acute conditions are known to elevate hsTnI results.  Refer to the "Links" section for chest pain algorithms and additional  guidance. Performed at [facility], [address] [place], [place] [address]   Blood gas, venous     Status: Abnormal   Collection Time: 01/25/23  7:02 PM  Result Value Ref Range   pH, Ven 7.43 7.25 - 7.43   pCO2, Ven 53 44 - 60 mmHg   pO2, Ven 31 (LL) 32 - 45 mmHg    Comment: CRITICAL RESULT CALLED TO, READ BACK BY AND VERIFIED WITH: Augusto Garbe RN @ [phone] 5//18/24. GILBERTL    Bicarbonate 35.2 (H) 20.0 - 28.0 mmol/L   Acid-Base Excess 9.1 (H) 0.0 - 2.0 mmol/L   O2 Saturation 62.7 %   Patient temperature 37.0     Comment: Performed at [facility], [address], [place], [place] [address]  Troponin I (High Sensitivity)     Status: None   Collection Time: 01/25/23  8:02 PM  Result Value Ref Range   Troponin I (High Sensitivity) 8 <18 ng/L    Comment: (NOTE) Elevated high sensitivity troponin I (hsTnI) values and significant  changes across serial measurements may suggest ACS but many other  chronic and acute  conditions are known to elevate hsTnI results.  Refer to the "Links" section for chest pain algorithms and additional  guidance. Performed at [facility], [address], [place], [place] [address]    CT Angio Chest Pulmonary Embolism (PE) W or WO Contrast  Result Date: 01/25/2023 CLINICAL DATA:  Chest pain, PE suspected  EXAM: CT ANGIOGRAPHY CHEST WITH CONTRAST TECHNIQUE: Multidetector CT imaging of the chest was performed using the standard protocol during bolus administration of intravenous contrast. Multiplanar CT image reconstructions and MIPs were obtained to evaluate the vascular anatomy. RADIATION DOSE REDUCTION: This exam was performed according to the departmental dose-optimization program which includes automated exposure control, adjustment of the mA and/or kV according to patient size and/or use of iterative reconstruction technique. CONTRAST:  75mL OMNIPAQUE IOHEXOL 350 MG/ML SOLN COMPARISON:  Chest radiograph 01/25/2023 and CT chest 01/10/2023 FINDINGS: Cardiovascular: Satisfactory opacification of the pulmonary arteries to the segmental level. No pulmonary embolism. Normal heart size. No pericardial effusion. Aortic and coronary artery atherosclerotic calcification. No aortic aneurysm or dissection. Mediastinum/Nodes: Unremarkable esophagus. Borderline enlarged 9 mm short axis right hilar node (4/59) is not significantly changed. Lungs/Pleura: Advanced emphysema. Postoperative changes of left upper lobectomy. Mild diffuse bronchial wall thickening and scattered centrilobular micro nodules. Mucous plugging in the bilateral lower lobe bronchi. Increased patchy consolidation/atelectasis in the posterior right upper lobe in the area of the previously noted 7 mm nodule. Scarring/atelectasis right lower lobe. 4 mm nodule in the medial right upper lobe (12/69 is unchanged from 2021 and should be benign. This nodule now measures approximately 9 mm. Similar atelectasis/scarring in  the left upper lung. No pleural effusion or pneumothorax. Upper Abdomen: No acute abnormality. Musculoskeletal: No acute fracture. Review of the MIP images confirms the above findings. IMPRESSION: 1. No evidence of acute pulmonary embolism. 2. Increased patchy consolidation/atelectasis in the posterior right upper lobe in the area of the previously noted 7 mm nodule. This could be infectious/inflammatory, but is indeterminate. Continued close interval follow-up is recommended with noncontrast chest CT in 3-6 months. 3. Mild diffuse bronchial wall thickening and scattered centrilobular micro nodules, likely infectious/inflammatory. Mucous plugging in the bilateral lower lobe bronchi. 4. Advanced emphysema. Postoperative changes of left upper lobectomy. Aortic Atherosclerosis (ICD10-I70.0) and Emphysema (ICD10-J43.9). Electronically Signed   By: Minerva Fester M.D.   On: 01/25/2023 20:07   DG Chest 2 View  Result Date: 01/25/2023 CLINICAL DATA:  Short of breath, history of left lung cancer EXAM: CHEST - 2 VIEW COMPARISON:  01/10/2023 FINDINGS: Frontal and lateral views of the chest demonstrate a stable cardiac silhouette. Stable postsurgical changes from left upper lobectomy, with pleural and parenchymal scarring and volume loss within the left upper hemithorax. Background emphysema again noted. No new consolidation, effusion, or pneumothorax. No acute bony abnormalities. IMPRESSION: 1. Postsurgical and post therapeutic changes related to prior left upper lobectomy. 2. Stable emphysema. 3. No acute airspace disease. 4. Please refer to previous chest CT report 01/10/2023 for a subcentimeter right upper lobe pulmonary nodule requiring follow-up. This is not well visualized by x-ray. Electronically Signed   By: Sharlet Salina M.D.   On: 01/25/2023 18:33    Pending Labs Unresulted Labs (From admission, onward)     Start     Ordered   01/26/23 0500  TSH  Tomorrow morning,   R        01/25/23 2128             Vitals/Pain Today's Vitals   01/25/23 2115 01/25/23 2130 01/25/23 2146 01/25/23 2150  BP:    100/66  Pulse: 94 (!) 105 90 93  Resp: (!) 22 (!) 22 19 (!) 22  Temp:    98.2 F (36.8 C)  TempSrc:    Oral  SpO2: 99% 96% 97% 97%  Weight:      Height:  PainSc:        Isolation Precautions No active isolations  Medications Medications  diltiazem (CARDIZEM) 1 mg/mL load via infusion 10 mg (10 mg Intravenous Bolus from Bag 01/25/23 2114)    And  diltiazem (CARDIZEM) 125 mg in dextrose 5% 125 mL (1 mg/mL) infusion (7.5 mg/hr Intravenous Rate/Dose Change 01/25/23 2146)  apixaban (ELIQUIS) tablet 5 mg (5 mg Oral Given 01/25/23 2121)  lactated ringers bolus 1,000 mL (0 mLs Intravenous Stopped 01/25/23 2030)  morphine (PF) 4 MG/ML injection 4 mg (4 mg Intravenous Given 01/25/23 1859)  iohexol (OMNIPAQUE) 350 MG/ML injection 75 mL (75 mLs Intravenous Contrast Given 01/25/23 1937)    Mobility walks     Focused Assessments Cardiac Assessment Handoff:  Cardiac Rhythm: Sinus tachycardia Lab Results  Component Value Date   CKTOTAL 101 05/02/2021   CKMB 1.8 04/28/2012   TROPONINI <0.30 04/28/2012   Lab Results  Component Value Date   DDIMER 1.53 (H) 03/30/2022   Does the Patient currently have chest pain? No   , Pulmonary Assessment Handoff:  Lung sounds: Bilateral Breath Sounds: Diminished O2 Device: Nasal Cannula O2 Flow Rate (L/min): 2 L/min    R Recommendations: See Admitting Provider Note  Report given to:   Additional Notes: New onset Afib, diltiazem gtt

## 2023-01-25 NOTE — Assessment & Plan Note (Signed)
continue levothyroxine

## 2023-01-26 ENCOUNTER — Inpatient Hospital Stay (HOSPITAL_COMMUNITY): Payer: 59

## 2023-01-26 DIAGNOSIS — I4891 Unspecified atrial fibrillation: Secondary | ICD-10-CM | POA: Diagnosis not present

## 2023-01-26 LAB — ECHOCARDIOGRAM COMPLETE
Area-P 1/2: 2.93 cm2
Calc EF: 61.8 %
Height: 63 in
S' Lateral: 2.3 cm
Single Plane A2C EF: 61.5 %
Single Plane A4C EF: 62.7 %
Weight: 1541.46 oz

## 2023-01-26 LAB — BASIC METABOLIC PANEL
Anion gap: 6 (ref 5–15)
BUN: 8 mg/dL (ref 8–23)
CO2: 32 mmol/L (ref 22–32)
Calcium: 8.2 mg/dL — ABNORMAL LOW (ref 8.9–10.3)
Chloride: 102 mmol/L (ref 98–111)
Creatinine, Ser: 0.63 mg/dL (ref 0.44–1.00)
GFR, Estimated: >60 mL/min (ref >60)
Glucose, Bld: 103 mg/dL — ABNORMAL HIGH (ref 70–99)
Potassium: 3.6 mmol/L (ref 3.5–5.1)
Sodium: 140 mmol/L (ref 135–145)

## 2023-01-26 LAB — CBC
HCT: 34.9 % — ABNORMAL LOW (ref 36.0–46.0)
Hemoglobin: 10.5 g/dL — ABNORMAL LOW (ref 12.0–15.0)
MCH: 28.8 pg (ref 26.0–34.0)
MCHC: 30.1 g/dL (ref 30.0–36.0)
MCV: 95.6 fL (ref 80.0–100.0)
Platelets: 254 10*3/uL (ref 150–400)
RBC: 3.65 MIL/uL — ABNORMAL LOW (ref 3.87–5.11)
RDW: 15 % (ref 11.5–15.5)
WBC: 9.5 10*3/uL (ref 4.0–10.5)
nRBC: 0 % (ref 0.0–0.2)

## 2023-01-26 LAB — MRSA NEXT GEN BY PCR, NASAL: MRSA by PCR Next Gen: NOT DETECTED

## 2023-01-26 LAB — MAGNESIUM: Magnesium: 2.1 mg/dL (ref 1.7–2.4)

## 2023-01-26 LAB — TSH: TSH: 4.406 u[IU]/mL (ref 0.350–4.500)

## 2023-01-26 MED ORDER — ORAL CARE MOUTH RINSE
15.0000 mL | OROMUCOSAL | Status: DC | PRN
  Administered 2023-01-27: 15 mL via OROMUCOSAL

## 2023-01-26 MED ORDER — AMIODARONE IV BOLUS ONLY 150 MG/100ML
150.0000 mg | Freq: Once | INTRAVENOUS | Status: AC
  Administered 2023-01-26: 150 mg via INTRAVENOUS
  Filled 2023-01-26: qty 100

## 2023-01-26 MED ORDER — METOPROLOL TARTRATE 25 MG PO TABS
25.0000 mg | ORAL_TABLET | Freq: Two times a day (BID) | ORAL | Status: DC
  Filled 2023-01-26: qty 1

## 2023-01-26 MED ORDER — GUAIFENESIN ER 600 MG PO TB12
600.0000 mg | ORAL_TABLET | Freq: Two times a day (BID) | ORAL | Status: DC
  Administered 2023-01-26 – 2023-01-28 (×4): 600 mg via ORAL
  Filled 2023-01-26 (×4): qty 1

## 2023-01-26 MED ORDER — SODIUM CHLORIDE 0.9 % IV SOLN: INTRAVENOUS | Status: AC

## 2023-01-26 MED ORDER — LACTATED RINGERS IV BOLUS
500.0000 mL | Freq: Once | INTRAVENOUS | Status: AC
  Administered 2023-01-26: 500 mL via INTRAVENOUS

## 2023-01-26 MED ORDER — SODIUM CHLORIDE 0.9 % IV BOLUS
500.0000 mL | Freq: Once | INTRAVENOUS | Status: AC
  Administered 2023-01-26: 500 mL via INTRAVENOUS

## 2023-01-26 MED ORDER — POTASSIUM CHLORIDE CRYS ER 20 MEQ PO TBCR
40.0000 meq | EXTENDED_RELEASE_TABLET | Freq: Once | ORAL | Status: AC
  Administered 2023-01-26: 40 meq via ORAL
  Filled 2023-01-26: qty 2

## 2023-01-26 MED ORDER — PREDNISONE 20 MG PO TABS: 40.0000 mg | ORAL_TABLET | Freq: Every day | ORAL | Status: DC

## 2023-01-26 MED ORDER — ACETAMINOPHEN 500 MG PO TABS
1000.0000 mg | ORAL_TABLET | Freq: Four times a day (QID) | ORAL | Status: DC | PRN
  Administered 2023-01-26 – 2023-01-27 (×3): 1000 mg via ORAL
  Filled 2023-01-26 (×4): qty 2

## 2023-01-26 MED ORDER — LEVALBUTEROL HCL 0.63 MG/3ML IN NEBU
0.6300 mg | INHALATION_SOLUTION | Freq: Four times a day (QID) | RESPIRATORY_TRACT | Status: DC | PRN
  Administered 2023-01-26 – 2023-01-27 (×2): 0.63 mg via RESPIRATORY_TRACT
  Filled 2023-01-26 (×2): qty 3

## 2023-01-26 MED ORDER — PREDNISONE 20 MG PO TABS
40.0000 mg | ORAL_TABLET | Freq: Every day | ORAL | Status: DC
  Administered 2023-01-26 – 2023-01-28 (×3): 40 mg via ORAL
  Filled 2023-01-26 (×3): qty 2

## 2023-01-26 NOTE — Progress Notes (Signed)
Patient's clothing with her medications were left at the corner table inside patient's belonging bag. Patient said her daughter will be coming in the morning to pick it up.

## 2023-01-26 NOTE — Progress Notes (Signed)
  Progress Note   Patient: Tracy Park MWU:[id number] DOB: 1956-05-03 DOA: 01/25/2023     1 DOS: the patient was seen and examined on 01/26/2023   Brief hospital course: 67 y.o. female with medical history significant of COPD with chronic hypoxic respiratory failure on 2L, lung cancer s/p LUL resection in remission, OSA, CAD, HLD, anxiety, depression, Bipolar Type 2 who presents with chest pain.    Had acute mid-sternal chest pain that was dull and then sharp starting yesterday while watching TV. Pain is constant without alleviating or aggravating factors. Associated with increasing shortness of breath but was able to remain on baseline 2L via [place]. No worse chronic cough. Continues to smoke half a pack daily. No hx of significant cardiac history. No nausea or vomiting. Finally came into ED because daughter told her to.    In the ED, she was afebrile, normotensive and initially in normal sinus rhythm. However upon ambulation noted to be in atrial fibrillation with RVR with HR up to 170  Assessment and Plan: New Paroxysmal Atrial fibrillation with RVR (HCC) -newly diagnosed atrial fibrillation -troponin flat at 9 and then 8  -soon to be 67 with CHA2D2VASc score of 2, started eliquis -Initially started on cardizem gtt, however pt did not tolerate secondary to low BP and cardizem was stopped -Pt unable to tolerate metoprolol secondary to low BP as well -Discussed with Cardiology, who will see -For now, will give IV amiodarone load -TSH reviewed, normal -2d echo reviewed. Normal LVEF   History of lung cancer -s/p left upper lobectomy in remission -noted to have new pulmonary nodules in recent CT scan. She is aware and has been following with her pulmonologist at [facility] to get schedule follow up scans.   Hypothyroidism (acquired) -continue levothyroxine -TSH normal   HTN (hypertension) -given soft BP, have held home ARB   COPD (chronic obstructive pulmonary disease) (HCC) -audible  wheezing this AM  -Have ordered PRN xopenex    Pulmonary nodule - CTA chest showed Increased patchy consolidation/atelectasis in the posterior right upper lobe in the area of the previously noted 7 mm nodule. This could be infectious/inflammatory, but is indeterminate. Continued close interval follow-up is recommended with noncontrast chest CT in 3-6 months.   Chronic respiratory failure with hypoxia (HCC) -remains on baseline 2L    Subjective: Reports intermittent bouts of "fluttering" of her chest  Physical Exam: Vitals:   01/26/23 1022 01/26/23 1100 01/26/23 1200 01/26/23 1246  BP: (!) 92/57 100/62 (!) 90/54   Pulse: 89 (!) 54 72   Resp:  18 (!) 27   Temp:    98 F (36.7 C)  TempSrc:    Oral  SpO2:  99% 96%   Weight:      Height:       General exam: Awake, laying in bed, in nad Respiratory system: Normal respiratory effort, no wheezing Cardiovascular system: regular rate, s1, s2 Gastrointestinal system: Soft, nondistended, positive BS Central nervous system: CN2-12 grossly intact, strength intact Extremities: Perfused, no clubbing Skin: Normal skin turgor, no notable skin lesions seen Psychiatry: Mood normal // no visual hallucinations   Data Reviewed:  Labs reviewed: Na 140, K 3.6, Cr 0.63, Mg 2.1  Family Communication: Pt in room, family not at bedside  Disposition: Status is: Inpatient Remains inpatient appropriate because: Severity of illness  Planned Discharge Destination: Home    Author: Rickey Barbara, MD 01/26/2023 2:52 PM  For on call review [email or web address].

## 2023-01-26 NOTE — Hospital Course (Signed)
67 y.o. female with medical history significant of COPD with chronic hypoxic respiratory failure on 2L, lung cancer s/p LUL resection in remission, OSA, CAD, HLD, anxiety, depression, Bipolar Type 2 who presents with chest pain.    Had acute mid-sternal chest pain that was dull and then sharp starting yesterday while watching TV. Pain is constant without alleviating or aggravating factors. Associated with increasing shortness of breath but was able to remain on baseline 2L via [place]. No worse chronic cough. Continues to smoke half a pack daily. No hx of significant cardiac history. No nausea or vomiting. Finally came into ED because daughter told her to.    In the ED, she was afebrile, normotensive and initially in normal sinus rhythm. However upon ambulation noted to be in atrial fibrillation with RVR with HR up to 170

## 2023-01-26 NOTE — TOC CM/SW Note (Signed)
  Transition of Care (TOC) Screening Note   Patient Details  Name: Tracy Park Date of Birth: [birth year]   Transition of Care [facility] CM/SW Contact:    Howell Rucks, RN Phone Number: 01/26/2023, 9:27 AM    Transition of Care Department [facility]) has reviewed patient and no TOC needs have been identified at this time. We will continue to monitor patient advancement through interdisciplinary progression rounds. If new patient transition needs arise, please place a TOC consult.

## 2023-01-26 NOTE — Progress Notes (Signed)
Echocardiogram 2D Echocardiogram has been performed.  Toni Amend 01/26/2023, 9:39 AM

## 2023-01-27 ENCOUNTER — Inpatient Hospital Stay: Payer: 59 | Admitting: Student

## 2023-01-27 DIAGNOSIS — I4891 Unspecified atrial fibrillation: Secondary | ICD-10-CM | POA: Diagnosis not present

## 2023-01-27 LAB — COMPREHENSIVE METABOLIC PANEL
ALT: 14 U/L (ref 0–44)
AST: 14 U/L — ABNORMAL LOW (ref 15–41)
Albumin: 3.1 g/dL — ABNORMAL LOW (ref 3.5–5.0)
Alkaline Phosphatase: 75 U/L (ref 38–126)
Anion gap: 6 (ref 5–15)
BUN: 18 mg/dL (ref 8–23)
CO2: 30 mmol/L (ref 22–32)
Calcium: 8.8 mg/dL — ABNORMAL LOW (ref 8.9–10.3)
Chloride: 104 mmol/L (ref 98–111)
Creatinine, Ser: 0.69 mg/dL (ref 0.44–1.00)
GFR, Estimated: >60 mL/min (ref >60)
Glucose, Bld: 85 mg/dL (ref 70–99)
Potassium: 3.9 mmol/L (ref 3.5–5.1)
Sodium: 140 mmol/L (ref 135–145)
Total Bilirubin: 0.2 mg/dL — ABNORMAL LOW (ref 0.3–1.2)
Total Protein: 6.4 g/dL — ABNORMAL LOW (ref 6.5–8.1)

## 2023-01-27 LAB — CBC
HCT: 34.9 % — ABNORMAL LOW (ref 36.0–46.0)
Hemoglobin: 10.9 g/dL — ABNORMAL LOW (ref 12.0–15.0)
MCH: 29.1 pg (ref 26.0–34.0)
MCHC: 31.2 g/dL (ref 30.0–36.0)
MCV: 93.1 fL (ref 80.0–100.0)
Platelets: 247 10*3/uL (ref 150–400)
RBC: 3.75 MIL/uL — ABNORMAL LOW (ref 3.87–5.11)
RDW: 14.4 % (ref 11.5–15.5)
WBC: 8.5 10*3/uL (ref 4.0–10.5)
nRBC: 0 % (ref 0.0–0.2)

## 2023-01-27 LAB — PROCALCITONIN: Procalcitonin: <0.1 ng/mL

## 2023-01-27 LAB — HEMOGLOBIN A1C
Hgb A1c MFr Bld: 5.7 % — ABNORMAL HIGH (ref 4.8–5.6)
Mean Plasma Glucose: 116.89 mg/dL

## 2023-01-27 LAB — MAGNESIUM: Magnesium: 1.9 mg/dL (ref 1.7–2.4)

## 2023-01-27 MED ORDER — AZITHROMYCIN 250 MG PO TABS: 500.0000 mg | ORAL_TABLET | Freq: Every day | ORAL | Status: DC

## 2023-01-27 MED ORDER — METOPROLOL SUCCINATE ER 25 MG PO TB24
12.5000 mg | ORAL_TABLET | Freq: Every day | ORAL | Status: DC
  Administered 2023-01-27 – 2023-01-28 (×2): 12.5 mg via ORAL
  Filled 2023-01-27 (×2): qty 1

## 2023-01-27 MED ORDER — AZITHROMYCIN 250 MG PO TABS
500.0000 mg | ORAL_TABLET | Freq: Every day | ORAL | Status: DC
  Administered 2023-01-27 – 2023-01-28 (×2): 500 mg via ORAL
  Filled 2023-01-27 (×2): qty 2

## 2023-01-27 MED ORDER — AZITHROMYCIN 250 MG PO TABS: 250.0000 mg | ORAL_TABLET | Freq: Every day | ORAL | Status: DC

## 2023-01-27 NOTE — Evaluation (Signed)
Occupational Therapy Evaluation Patient Details Name: Tracy Park MRN: [id number] DOB: 1955-12-15 Today's Date: 01/27/2023   History of Present Illness Pt is 67 yo female admitted on 01/25/23 with new onset afib.  Pt with hx including but not limited to COPD on 2 L O2, lung CA s/p LUL resection in remission, OSA, CAD, HLD, anxiety, depression, bipolar, chest pain   Clinical Impression   Pt is at baseline Mod I - Sup level of function with ADLs and ADL mobility with no AD for for walking to bathroom. PTA pt lived with daughter and son in law.  Pt's O2 SATs dropping with minimal functional activity exertion and ADL mobility. 86% on 2L O2 after walking to bathroom transferring to toilet with Sup, however quickly recovered >88% with deep, pursed lip breathing, . Reviewed energy conservation strategies with pt (handouts provided at recent [facility] stay). All education completed and no further acute or follow up OT services are indicated     Recommendations for follow up therapy are one component of a multi-disciplinary discharge planning process, led by the attending physician.  Recommendations may be updated based on patient status, additional functional criteria and insurance authorization.   Assistance Recommended at Discharge PRN  Patient can return home with the following Assistance with cooking/housework    Functional Status Assessment  Patient has not had a recent decline in their functional status  Equipment Recommendations  Tub/shower seat    Recommendations for Other Services       Precautions / Restrictions Precautions Precautions: None Precaution Comments: typically 2L O2 baseline Restrictions Weight Bearing Restrictions: No      Mobility Bed Mobility Overal bed mobility: Modified Independent                  Transfers Overall transfer level: Needs assistance Equipment used: None Transfers: Sit to/from Stand Sit to Stand: Supervision                   Balance Overall balance assessment: Modified Independent   Sitting balance-Leahy Scale: Good     Standing balance support: No upper extremity supported, During functional activity Standing balance-Leahy Scale: Good                             ADL either performed or assessed with clinical judgement   ADL Overall ADL's : At baseline;Modified independent                                       General ADL Comments: pt's O2 SATs dropping with minimal functional activity exertion and ADL mobility. 86% on 2L O2 after walking to bathroom, transferring to toilet with Sup. Reviewed energy conservation strategies with pt (handouts provided at recent [facility] stay)     Vision Baseline Vision/History: 1 Wears glasses Ability to See in Adequate Light: 0 Adequate Patient Visual Report: No change from baseline       Perception     Praxis      Pertinent Vitals/Pain Pain Assessment Pain Assessment: No/denies pain Pain Score: 0-No pain Pain Intervention(s): Monitored during session     Hand Dominance Right   Extremity/Trunk Assessment Upper Extremity Assessment Upper Extremity Assessment: Overall WFL for tasks assessed   Lower Extremity Assessment Lower Extremity Assessment: Defer to PT evaluation   Cervical / Trunk Assessment Cervical / Trunk Assessment: Normal  Communication Communication Communication: No difficulties   Cognition Arousal/Alertness: Awake/alert Behavior During Therapy: WFL for tasks assessed/performed Overall Cognitive Status: Within Functional Limits for tasks assessed                                       General Comments  Pt on 2 L O2 with sats 95% rest and 90% at lowest ambulation.  HR 90's rest and low 100's walking (max 105 bpm).  Encouraged pt to ambulate at least 3xday with staff for line management    Exercises     Shoulder Instructions      Home Living Family/patient expects to be  discharged to:: Private residence Living Arrangements: Children Available Help at Discharge: Family;Available 24 hours/day Type of Home: House Home Access: Level entry     Home Layout: One level     Bathroom Shower/Tub: Chief Strategy Officer: Standard Bathroom Accessibility: Yes   Home Equipment: None   Additional Comments: On 2 L O2 at home 24/7      Prior Functioning/Environment Prior Level of Function : Independent/Modified Independent             Mobility Comments: Ambulates without AD; can ambulate in community ADLs Comments: Ind with ADLs/selfcare, IADLs, home mgt, cooking, drives        OT Problem List: Decreased activity tolerance;Pain      OT Treatment/Interventions: Self-care/ADL training;Energy conservation;Therapeutic activities;Patient/family education;DME and/or AE instruction    OT Goals(Current goals can be found in the care plan section) Acute Rehab OT Goals Patient Stated Goal: go home OT Goal Formulation: All assessment and education complete, DC therapy  OT Frequency:      Co-evaluation              AM-PAC OT "6 Clicks" Daily Activity     Outcome Measure Help from another person eating meals?: None Help from another person taking care of personal grooming?: None Help from another person toileting, which includes using toliet, bedpan, or urinal?: None Help from another person bathing (including washing, rinsing, drying)?: None Help from another person to put on and taking off regular upper body clothing?: None Help from another person to put on and taking off regular lower body clothing?: None 6 Click Score: 24   End of Session Equipment Utilized During Treatment: Oxygen  Activity Tolerance: Patient tolerated treatment well Patient left: in bed;with call bell/phone within reach  OT Visit Diagnosis: Muscle weakness (generalized) (M62.81)                Time: 0981-1914 OT Time Calculation (min): 16 min Charges:  OT  General Charges $OT Visit: 1 Visit OT Evaluation $OT Eval Low Complexity: 1 Low    Galen Manila 01/27/2023, 3:14 PM

## 2023-01-27 NOTE — Consult Note (Addendum)
Cardiology Consultation   Patient ID: REFUJIA CELLUCCI MRN: [id number]; DOB: 1956/08/15  Admit date: 01/25/2023 Date of Consult: 01/27/2023  PCP:  Tracy Hawking, PA-C   [facility] Providers Cardiologist:  None   [facility]. Would like to transfer care.    Patient Profile:   Tracy Park is a 67 y.o. female with a hx of COPD with chronic hypoxic respiratory failure on 2L chronically, lung cancer s/p LUL resection in remission, OSA, nonobstructive CAD by cor CT 2019 (25 to 50% LAD lesion), aortic atherosclerosis, HLD  who is being seen 01/27/2023 for the evaluation of new onset atrial fibrillation at the request of Dr. Rhona Leavens.  History of Present Illness:   [name] is a patient of [facility] and has history of an elevated calcium calcification score and reported 25 to 50% LAD lesion by coronary CTA in 2019. In an office visit in August 2022 she had complaints of DOE so given CAD history, a Eugenie Birks was ordered and was found to be normal in 05/2021.  Also on her chart it is noted that she has had a MI, however she adamantly denies any history of this.  Additionally, patient has known COPD which also seem to be the primary driver of her complaints. She has had multiple admissions in the past 2 months for exacerbations.   On 01/25/2023 patient initially presented with complaints of midsternal chest pain with accompanying numbness and tingling bilaterally in her arms while watching television.  She stated she had worsening shortness of breath however was still on baseline 2 L of nasal cannula.  Upon further evaluation it was noted that patient was in A-fib RVR with heart rates up to 170 during ambulation.  Troponins were flat and negative.  She was started on a diltiazem drip and anticoagulation was initiated due to a CHA2DS2-VASc score of 2.  Echocardiogram indicating normal LVEF within normal left atrium.  Patient was initially started on diltiazem with improved  heart rates between 60-85; however, have now been stopped due to low blood pressures.  Then patient metoprolol was used and also stopped due to low BP. It appears patient converted to normal sinus rhythm on May 19 at approximately 0100. A bolus of IV amio was given later that day for reported AF with ambulation though appeared to be in NSR throughout the day. She continued to have issues with hypotension yesterday despite maintaining NSR.  CTA was negative for pulmonary embolism however showed increased patchy consolidation/atelectasis in the posterior right upper lobe.  There are some question whether this is infectious or inflammatory.  Hemoglobin also 10.9 however seems to be around baseline.  TSH normal.  After discussion with patient it appears that she has had palpitations intermittently for several months now.  Patient states that she has been admitted multiple times in the past year for recurrent pneumonia infections.  Her last admission was in [place] about 1 month ago and she was admitted for recurrent pneumonia infection.  She had heart palpitations during that time and was going to see a cardiologist there but never went because she came back from her trip.  Patient states that she can feel her palpitations and has accompanied shortness of breath and sometimes feels light headed.  She still states that she has intermittent palpitations despite being in normal sinus rhythm and no indication of recurrence of atrial fibrillation on telemetry.  She still reports feeling very weak and not 100%.  Additionally, she still complains of  intermittent chest pain.  This appears to be pleuritic in nature and is associated with deep inhalations and worsened when coughing.  Past Medical History:  Diagnosis Date   Anginal pain (HCC)    Anxiety    Bipolar disorder (HCC)    Cancer (HCC)    COPD (chronic obstructive pulmonary disease) (HCC)    Dyspnea    Family history of adverse reaction to anesthesia     History of kidney stones    Hydroureteronephrosis 08/16/2021   Hypothyroidism    Lung cancer (HCC)    Myocardial infarction (HCC)    PTSD (post-traumatic stress disorder)    Sleep apnea    Thyroid disease     Past Surgical History:  Procedure Laterality Date   ABDOMINAL HYSTERECTOMY     BACK SURGERY     CYSTOSCOPY W/ URETERAL STENT PLACEMENT Right 05/03/2021   Procedure: CYSTOSCOPY WITH RETROGRADE PYELOGRAM/URETERAL STENT PLACEMENT;  Surgeon: Crist Fat, MD;  Location: [facility];  Service: Urology;  Laterality: Right;   EYE SURGERY     kidney stent     thyroidectomy       Inpatient Medications: Scheduled Meds:  apixaban  5 mg Oral BID   Chlorhexidine Gluconate Cloth  6 each Topical Daily   guaiFENesin  600 mg Oral BID   levothyroxine  125 mcg Oral Q0600   magnesium oxide  400 mg Oral Q1200   mometasone-formoterol  2 puff Inhalation BID   pantoprazole  40 mg Oral Daily   pneumococcal 20-valent conjugate vaccine  0.5 mL Intramuscular Tomorrow-1000   predniSONE  40 mg Oral Q breakfast   rosuvastatin  20 mg Oral Daily   tamsulosin  0.4 mg Oral Daily   umeclidinium bromide  1 puff Inhalation Daily   Continuous Infusions:  PRN Meds: acetaminophen, levalbuterol, mouth rinse  Allergies:    Allergies  Allergen Reactions   Red Dye Hives and Itching   Oxycontin [Oxycodone Hcl] Other (See Comments)    Hallucinations    Prednisone Hives and Nausea And Vomiting   Aspirin Hives   Tape Rash    Prefers paper tape   Wound Dressing Adhesive Rash    Social History:   Social History   Socioeconomic History   Marital status: Divorced    Spouse name: Not on file   Number of children: Not on file   Years of education: Not on file   Highest education level: Not on file  Occupational History   Not on file  Tobacco Use   Smoking status: Every Day    Packs/day: .15    Types: Cigarettes   Smokeless tobacco: Never  Vaping Use   Vaping Use: Never used  Substance and  Sexual Activity   Alcohol use: Not Currently   Drug use: Not Currently   Sexual activity: Not on file  Other Topics Concern   Not on file  Social History Narrative   Not on file   Social Determinants of Health   Financial Resource Strain: Not on file  Food Insecurity: No Food Insecurity (01/25/2023)   Hunger Vital Sign    Worried About Running Out of Food in the Last Year: Never true    Ran Out of Food in the Last Year: Never true  Transportation Needs: No Transportation Needs (01/25/2023)   PRAPARE - Administrator, Civil Service (Medical): No    Lack of Transportation (Non-Medical): No  Physical Activity: Not on file  Stress: Not on file  Social Connections: Not  on file  Intimate Partner Violence: Not At Risk (01/25/2023)   Humiliation, Afraid, Rape, and Kick questionnaire    Fear of Current or Ex-Partner: No    Emotionally Abused: No    Physically Abused: No    Sexually Abused: No    Family History:   Family History  Family history unknown: Yes     ROS:  Please see the history of present illness.  All other ROS reviewed and negative.     Physical Exam/Data:   Vitals:   01/27/23 0300 01/27/23 0400 01/27/23 0700 01/27/23 0719  BP:  123/75  129/88  Pulse:  76 86 76  Resp:  (!) 23 14 20   Temp: 97.9 F (36.6 C)   97.9 F (36.6 C)  TempSrc: Oral   Oral  SpO2:  99% 97% 99%  Weight:      Height:        Intake/Output Summary (Last 24 hours) at 01/27/2023 0906 Last data filed at 01/27/2023 0315 Gross per 24 hour  Intake 2063.85 ml  Output --  Net 2063.85 ml      01/25/2023   11:22 PM 01/25/2023    7:00 PM 01/25/2023    6:09 PM  Last 3 Weights  Weight (lbs) 96 lb 5.5 oz 110 lb 105 lb  Weight (kg) 43.7 kg 49.896 kg 47.628 kg     Body mass index is 17.07 kg/m.  General:  Well nourished, well developed, in no acute distress. Feels weak. HEENT: normal Neck: no JVD Vascular: No carotid bruits; Distal pulses 2+ bilaterally Cardiac:  normal S1, S2;  RRR; no murmur  Lungs: +diffuse loud rhonchi, poor air movement, no wheezing Abd: soft, nontender, no hepatomegaly  Ext: no edema Musculoskeletal:  No deformities, BUE and BLE strength normal and equal Skin: warm and dry  Neuro:  CNs 2-12 intact, no focal abnormalities noted Psych:  Normal affect   EKG:  The EKG was personally reviewed and demonstrates:   Multiple tracings reviewed: Initial: ST 100bpm, biatrial enlargement, nonspecific ST upsloping in V2, otherwise nonacute F/u EKG 5/18 20:42: AF RVR 134, similar appearance to prior otherwise F/u EKG today: NSR 86bpm, IRBBB with nonspecific ST upsloping V2, otherwise no acute STT changes  Telemetry:  Telemetry was personally reviewed and demonstrates: Normal sinus rhythm with heart rates 60-85.  Intermittent dropped beats.  Relevant CV Studies: Echocardiogram 01/26/2023 1. Left ventricular ejection fraction, by estimation, is 60 to 65%. The  left ventricle has normal function. The left ventricle has no regional  wall motion abnormalities. Left ventricular diastolic parameters were  normal.   2. Right ventricular systolic function is normal. The right ventricular  size is normal. There is mildly elevated pulmonary artery systolic  pressure.   3. The mitral valve is normal in structure. No evidence of mitral valve  regurgitation.   4. The aortic valve is tricuspid. Aortic valve regurgitation is not  visualized.   5. The inferior vena cava is dilated in size with <50% respiratory  variability, suggesting right atrial pressure of 15 mmHg.   Laboratory Data:  High Sensitivity Troponin:   Recent Labs  Lab 01/07/23 2212 01/08/23 0024 01/25/23 1826 01/25/23 2002  TROPONINIHS 8 6 9 8      Chemistry Recent Labs  Lab 01/25/23 1826 01/26/23 0251 01/27/23 0315  NA 140 140 140  K 3.7 3.6 3.9  CL 100 102 104  CO2 32 32 30  GLUCOSE 113* 103* 85  BUN 12 8 18   CREATININE 0.58 0.63 0.69  CALCIUM 9.1 8.2* 8.8*  MG  --  2.1 1.9   GFRNONAA >60 >60 >60  ANIONGAP 8 6 6     Recent Labs  Lab 01/27/23 0315  PROT 6.4*  ALBUMIN 3.1*  AST 14*  ALT 14  ALKPHOS 75  BILITOT 0.2*   Lipids No results for input(s): "CHOL", "TRIG", "HDL", "LABVLDL", "LDLCALC", "CHOLHDL" in the last 168 hours.  Hematology Recent Labs  Lab 01/25/23 1826 01/26/23 0251 01/27/23 0315  WBC 10.6* 9.5 8.5  RBC 4.40 3.65* 3.75*  HGB 12.6 10.5* 10.9*  HCT 40.6 34.9* 34.9*  MCV 92.3 95.6 93.1  MCH 28.6 28.8 29.1  MCHC 31.0 30.1 31.2  RDW 14.8 15.0 14.4  PLT 299 254 247   Thyroid  Recent Labs  Lab 01/26/23 0251  TSH 4.406    BNPNo results for input(s): "BNP", "PROBNP" in the last 168 hours.  DDimer No results for input(s): "DDIMER" in the last 168 hours.   Radiology/Studies:  ECHOCARDIOGRAM COMPLETE  Result Date: 01/26/2023    ECHOCARDIOGRAM REPORT   Patient Name:   Tracy Park Date of Exam: 01/26/2023 Medical Rec #:  [id number]        Height:       63.0 in Accession #:    [id number]       Weight:       96.3 lb Date of Birth:  Nov 09, 1955        BSA:          1.418 m Patient Age:    66 years         BP:           93/68 mmHg Patient Gender: F                HR:           52 bpm. Exam Location:  Inpatient Procedure: 2D Echo, Cardiac Doppler and Color Doppler Indications:    I48.91* Unspeicified atrial fibrillation  History:        Patient has no prior history of Echocardiogram examinations.                 Previous Myocardial Infarction; COPD.  Sonographer:    Mike Gip Referring Phys: [id number] CHING T TU IMPRESSIONS  1. Left ventricular ejection fraction, by estimation, is 60 to 65%. The left ventricle has normal function. The left ventricle has no regional wall motion abnormalities. Left ventricular diastolic parameters were normal.  2. Right ventricular systolic function is normal. The right ventricular size is normal. There is mildly elevated pulmonary artery systolic pressure.  3. The mitral valve is normal in structure. No evidence  of mitral valve regurgitation.  4. The aortic valve is tricuspid. Aortic valve regurgitation is not visualized.  5. The inferior vena cava is dilated in size with <50% respiratory variability, suggesting right atrial pressure of 15 mmHg. FINDINGS  Left Ventricle: Left ventricular ejection fraction, by estimation, is 60 to 65%. The left ventricle has normal function. The left ventricle has no regional wall motion abnormalities. The left ventricular internal cavity size was normal in size. There is  no left ventricular hypertrophy. Left ventricular diastolic parameters were normal. Right Ventricle: The right ventricular size is normal. Right ventricular systolic function is normal. There is mildly elevated pulmonary artery systolic pressure. The tricuspid regurgitant velocity is 2.55 m/s, and with an assumed right atrial pressure of 15 mmHg, the estimated right ventricular systolic pressure is 41.0 mmHg. Left Atrium: Left atrial size was normal in  size. Right Atrium: Right atrial size was normal in size. Pericardium: There is no evidence of pericardial effusion. Mitral Valve: The mitral valve is normal in structure. There is mild calcification of the mitral valve leaflet(s). No evidence of mitral valve regurgitation. Tricuspid Valve: Tricuspid valve regurgitation is mild. Aortic Valve: The aortic valve is tricuspid. Aortic valve regurgitation is not visualized. Pulmonic Valve: Pulmonic valve regurgitation is not visualized. Aorta: The aortic root and ascending aorta are structurally normal, with no evidence of dilitation. Venous: The inferior vena cava is dilated in size with less than 50% respiratory variability, suggesting right atrial pressure of 15 mmHg. IAS/Shunts: The interatrial septum was not well visualized.  LEFT VENTRICLE PLAX 2D LVIDd:         3.90 cm     Diastology LVIDs:         2.30 cm     LV e' medial:    6.53 cm/s LV PW:         1.00 cm     LV E/e' medial:  12.7 LV IVS:        1.00 cm     LV e'  lateral:   10.00 cm/s LVOT diam:     2.00 cm     LV E/e' lateral: 8.3 LV SV:         75 LV SV Index:   53 LVOT Area:     3.14 cm  LV Volumes (MOD) LV vol d, MOD A2C: 76.8 ml LV vol d, MOD A4C: 54.2 ml LV vol s, MOD A2C: 29.6 ml LV vol s, MOD A4C: 20.2 ml LV SV MOD A2C:     47.2 ml LV SV MOD A4C:     54.2 ml LV SV MOD BP:      41.3 ml RIGHT VENTRICLE             IVC RV Basal diam:  3.40 cm     IVC diam: 2.40 cm RV S prime:     15.70 cm/s TAPSE (M-mode): 2.0 cm LEFT ATRIUM             Index        RIGHT ATRIUM          Index LA diam:        2.70 cm 1.90 cm/m   RA Area:     9.21 cm LA Vol (A2C):   27.6 ml 19.47 ml/m  RA Volume:   18.40 ml 12.98 ml/m LA Vol (A4C):   20.2 ml 14.25 ml/m LA Biplane Vol: 24.2 ml 17.07 ml/m  AORTIC VALVE LVOT Vmax:   112.00 cm/s LVOT Vmean:  63.500 cm/s LVOT VTI:    0.239 m  AORTA Ao Root diam: 3.00 cm Ao Asc diam:  2.70 cm MITRAL VALVE               TRICUSPID VALVE MV Area (PHT): 2.93 cm    TR Peak grad:   26.0 mmHg MV Decel Time: 259 msec    TR Vmax:        255.00 cm/s MV E velocity: 83.10 cm/s MV A velocity: 71.60 cm/s  SHUNTS MV E/A ratio:  1.16        Systemic VTI:  0.24 m                            Systemic Diam: 2.00 cm Carolan Clines Electronically signed by Carolan Clines Signature Date/Time: 01/26/2023/10:48:12 AM  Final    CT Angio Chest Pulmonary Embolism (PE) W or WO Contrast  Result Date: 01/25/2023 CLINICAL DATA:  Chest pain, PE suspected EXAM: CT ANGIOGRAPHY CHEST WITH CONTRAST TECHNIQUE: Multidetector CT imaging of the chest was performed using the standard protocol during bolus administration of intravenous contrast. Multiplanar CT image reconstructions and MIPs were obtained to evaluate the vascular anatomy. RADIATION DOSE REDUCTION: This exam was performed according to the departmental dose-optimization program which includes automated exposure control, adjustment of the mA and/or kV according to patient size and/or use of iterative reconstruction technique.  CONTRAST:  75mL OMNIPAQUE IOHEXOL 350 MG/ML SOLN COMPARISON:  Chest radiograph 01/25/2023 and CT chest 01/10/2023 FINDINGS: Cardiovascular: Satisfactory opacification of the pulmonary arteries to the segmental level. No pulmonary embolism. Normal heart size. No pericardial effusion. Aortic and coronary artery atherosclerotic calcification. No aortic aneurysm or dissection. Mediastinum/Nodes: Unremarkable esophagus. Borderline enlarged 9 mm short axis right hilar node (4/59) is not significantly changed. Lungs/Pleura: Advanced emphysema. Postoperative changes of left upper lobectomy. Mild diffuse bronchial wall thickening and scattered centrilobular micro nodules. Mucous plugging in the bilateral lower lobe bronchi. Increased patchy consolidation/atelectasis in the posterior right upper lobe in the area of the previously noted 7 mm nodule. Scarring/atelectasis right lower lobe. 4 mm nodule in the medial right upper lobe (12/69 is unchanged from 2021 and should be benign. This nodule now measures approximately 9 mm. Similar atelectasis/scarring in the left upper lung. No pleural effusion or pneumothorax. Upper Abdomen: No acute abnormality. Musculoskeletal: No acute fracture. Review of the MIP images confirms the above findings. IMPRESSION: 1. No evidence of acute pulmonary embolism. 2. Increased patchy consolidation/atelectasis in the posterior right upper lobe in the area of the previously noted 7 mm nodule. This could be infectious/inflammatory, but is indeterminate. Continued close interval follow-up is recommended with noncontrast chest CT in 3-6 months. 3. Mild diffuse bronchial wall thickening and scattered centrilobular micro nodules, likely infectious/inflammatory. Mucous plugging in the bilateral lower lobe bronchi. 4. Advanced emphysema. Postoperative changes of left upper lobectomy. Aortic Atherosclerosis (ICD10-I70.0) and Emphysema (ICD10-J43.9). Electronically Signed   By: Minerva Fester M.D.   On:  01/25/2023 20:07   DG Chest 2 View  Result Date: 01/25/2023 CLINICAL DATA:  Short of breath, history of left lung cancer EXAM: CHEST - 2 VIEW COMPARISON:  01/10/2023 FINDINGS: Frontal and lateral views of the chest demonstrate a stable cardiac silhouette. Stable postsurgical changes from left upper lobectomy, with pleural and parenchymal scarring and volume loss within the left upper hemithorax. Background emphysema again noted. No new consolidation, effusion, or pneumothorax. No acute bony abnormalities. IMPRESSION: 1. Postsurgical and post therapeutic changes related to prior left upper lobectomy. 2. Stable emphysema. 3. No acute airspace disease. 4. Please refer to previous chest CT report 01/10/2023 for a subcentimeter right upper lobe pulmonary nodule requiring follow-up. This is not well visualized by x-ray. Electronically Signed   By: Sharlet Salina M.D.   On: 01/25/2023 18:33     Assessment and Plan:   New onset paroxysmal atrial fibrillation Initially presented with heart rates in the 170s however has been less than 100s otherwise this admission.  Although this is the first occurrence of documented atrial fibrillation, patient states that she has had episodes of intermittent palpitations for several months now in the setting of recurrent pneumonia infections.  She has accompanied shortness of breath and occasional feelings of lightheadedness.  She was not been able to tolerate diltiazem or metoprolol earlier on admission due to hypotension.  Based off  telemetry it appears she has been in normal sinus rhythm since May 19 at approximately 0100.  She still has complaints of feeling weak, palpitations and not 100%.  Heart rates have been between 60-85. Repeat EKG confirms NSR - nonspecific ST upsloping in V2, similar to 12/2022 Echocardiogram showing normal LVEF of 60 to 65% and a normal sized LA Long term Amio may not be a good option due to history of thyroid disease and resection. Patient might  be candidate for cardiac ablation given recent new onset and intolerance to other medications. Patient does have documented MI on her chart however adamantly denies any history.  Will consult with MD.  She has normal renal function and no cardiomyopathy. She has been started on Eliquis 5 mg twice daily  Will order A1c for further risk stratification, has had elevated glucose readings.   Pleuritic chest pain  Patient initially presented to the emergency room with new onset complaints of chest pain while watching television.  She had radiating pain going into her arms however this was in the setting of A-fib RVR and likely to be related to demand ischemia.  Additionally she has had a COPD exacerbation and has worsening chest pain with inhalation and cough.  EKG does not show any ischemic changes and troponins were negative.  Hypertension  Holding home losartan given soft pressures and hypotension. Now seems to be in a good range 120s systolic. Since she had hypotension even while in NSR, cannot clearly explain the hypotension from arrhythmia standpoint.  Nonobstructive CAD Hyperlipidemia Patient has reported history of normal Lexiscan 2022 and 25 to 50% LAD lesion based off [facility] cardiology notes.  However there are no records of any cardiac catheterizations. Continue Crestor 20 mg daily. Will order lipid panel.   COPD OSA- uses night time CPAP, but has not had one for awhile. Need to get CPAP. Lung cancer in remission Hypothyroidism  Risk Assessment/Risk Scores:   CHA2DS2-VASc Score = 1   This indicates a 0.6% annual risk of stroke. The patient's score is based upon: CHF History: 0 HTN History: 0 Diabetes History: 0 Stroke History: 0 Vascular Disease History: 0 Age Score: 0 Gender Score: 1  Will be 67 soon. Which would increase score to 2.   For questions or updates, please contact [facility] Please consult [email or web address] for contact info under  Signed, Abagail Kitchens,  PA-C  01/27/2023 9:06 AM   Patient seen and examined, note reviewed with the signed Advanced Practice Provider. I personally reviewed laboratory data, imaging studies and relevant notes. I independently examined the patient and formulated the important aspects of the plan. I have personally discussed the plan with the patient and/or family. Comments or changes to the note/plan are indicated below.  Telemetry review sinus rhythm, no evidence of atrial fibrillation.  GEN:  Well nourished, petite, well developed in no acute distress HEENT: Mucous membranes moist, good dentition NECK: No JVD; No carotid bruits LYMPHATICS: No lymphadenopathy CARDIAC: S1S2 noted, RRR, no murmurs, rubs, gallops RESPIRATORY: Diffuse wheezing. ABDOMEN: Soft, non-tender, non-distended, bowel sounds noted, no guarding EXTREMITIES:No cyanosis, no cyanosis, no clubbing MUSCULOSKELETAL: No deformity  SKIN: Warm and dry NEUROLOGIC:  Alert and oriented x 3, nonfocal PSYCHIATRIC:  Normal affect, good insight   A/P  Paroxysmal atrial fibrillation reported last seen on telemetry monitor or EKG strips Pleuritic chest pain resolved Nonobstructive CAD Hyperlipidemia Hypertension OSA not currently compliant with CPAP Hypothyroidism COPD   She is status post low-dose of amiodarone now is in  sinus rhythm.  Unfortunately I do not have Heminevrin of her reported run of atrial fibrillation.  She does have risk factors for A-fib.  She has been started on Eliquis 5 mg twice daily which was started on Jan 25, 2023.  It seems like her hemoglobin has slightly decreased has been started on the Eliquis.  Will need to monitor this closely  If hemoglobin continues to drop I would recommend holding the Eliquis.  CHA2DS2-VASc score is 2.  Blood pressure is on the lower side her losartan is being held.  She will benefit from low-dose Toprol 12.5 mg daily.  On the low-dose Toprol will have to also monitor her breathing as she has very mild  wheezing in the setting of her COPD.  Anginal symptoms suspect that her pain was in the setting of A-fib RVR at this time no indication for further ischemic evaluation.  Hyperlipidemia - continue with current statin medication.  Will need to have a follow-up in the outpatient setting for CPAP, current OSA is not being treated.   Thomasene Ripple DO, MS [facility] Attending Cardiologist [facility]  [address] [place], [place] [address] [phone] Website: [email or web address]

## 2023-01-27 NOTE — Progress Notes (Signed)
Chaplain received a referral from social worker to provide grief support.  Chaplain was not able to visit today due to staffing shortage, but asked her nurse to let her know that chaplain would be coming on Tuesday.  If urgent needs arise, please page [place] at [phone].   Chaplain [employer], Bcc

## 2023-01-27 NOTE — Progress Notes (Signed)
Progress Note   Patient: Tracy Park:[id number] DOB: [birth year] DOA: 01/25/2023     2 DOS: the patient was seen and examined on 01/27/2023   Brief hospital course: 67 y.o. female with medical history significant of COPD with chronic hypoxic respiratory failure on 2L, lung cancer s/p LUL resection in remission, OSA, CAD, HLD, anxiety, depression, Bipolar Type 2 who presents with chest pain.    Had acute mid-sternal chest pain that was dull and then sharp starting yesterday while watching TV. Pain is constant without alleviating or aggravating factors. Associated with increasing shortness of breath but was able to remain on baseline 2L via [place]. No worse chronic cough. Continues to smoke half a pack daily. No hx of significant cardiac history. No nausea or vomiting. Finally came into ED because daughter told her to.    In the ED, she was afebrile, normotensive and initially in normal sinus rhythm. However upon ambulation noted to be in atrial fibrillation with RVR with HR up to 170  Assessment and Plan: New Paroxysmal Atrial fibrillation with RVR (HCC) -newly diagnosed atrial fibrillation -troponin flat at 9 and then 8  -soon to be 67 with CHA2D2VASc score of 2, started eliquis -Initially started on cardizem gtt, however pt did not tolerate secondary to low BP and cardizem was stopped -Pt unable to tolerate metoprolol secondary to low BP as well -Pt was given one dose of IV amio -Appreciate input by Cardiology -TSH reviewed, normal -2d echo reviewed. Normal LVEF -Now on beta blocker. Cardiology recommends close monitoring of hgb given slight drop in hgb today. Will recheck CBC in AM   History of lung cancer -s/p left upper lobectomy in remission -noted to have new pulmonary nodules in recent CT scan. She is aware and has been following with her pulmonologist at [facility] to get schedule follow up scans.   Hypothyroidism (acquired) -continue levothyroxine -TSH normal   HTN  (hypertension) -given soft BP, have held home ARB   COPD (chronic obstructive pulmonary disease) (HCC) -audible wheezing this AM  -Have ordered PRN xopenex    Pulmonary nodule - CTA chest showed Increased patchy consolidation/atelectasis in the posterior right upper lobe in the area of the previously noted 7 mm nodule. This could be infectious/inflammatory, but is indeterminate. Continued close interval follow-up is recommended with noncontrast chest CT in 3-6 months. -will give trial of empiric zpak -procal is neg at <0.10   Chronic respiratory failure with hypoxia (HCC) -remains on baseline 2L    Subjective: Reports feeling better today  Physical Exam: Vitals:   01/27/23 1400 01/27/23 1500 01/27/23 1600 01/27/23 1700  BP: (!) 146/66  126/73   Pulse: 86 93 85 89  Resp: (!) 30 18 20  (!) 29  Temp:   98 F (36.7 C)   TempSrc:   Oral   SpO2: 98% 95% 97% 97%  Weight:      Height:       General exam: Conversant, in no acute distress Respiratory system: normal chest rise, clear, no audible wheezing Cardiovascular system: regular rhythm, s1-s2 Gastrointestinal system: Nondistended, nontender, pos BS Central nervous system: No seizures, no tremors Extremities: No cyanosis, no joint deformities Skin: No rashes, no pallor Psychiatry: Affect normal // no auditory hallucinations   Data Reviewed:  Labs reviewed: Na 140, K 3.9, Cr 0.69, Hgb 10.9  Family Communication: Pt in room, family not at bedside  Disposition: Status is: Inpatient Remains inpatient appropriate because: Severity of illness  Planned Discharge Destination: Home  Author: Rickey Barbara, MD 01/27/2023 6:14 PM  For on call review [email or web address].

## 2023-01-27 NOTE — Plan of Care (Signed)

## 2023-01-27 NOTE — Progress Notes (Signed)
Report called to [place], 4E RN. All questions answered at this time. All patient belongings and paper chart transported with patient. Patient was transported upstairs, in the wheelchair, by RN. Handoff completed.

## 2023-01-27 NOTE — Evaluation (Signed)
Physical Therapy Evaluation Patient Details Name: Tracy Park MRN: [id number] DOB: 02/25/1956 Today's Date: 01/27/2023  History of Present Illness  Pt is 67 yo female admitted on 01/25/23 with new onset afib.  Pt with hx including but not limited to COPD on 2 L O2, lung CA s/p LUL resection in remission, OSA, CAD, HLD, anxiety, depression, bipolar, chest pain  Clinical Impression  Pt admitted with above diagnosis. At baseline pt is independent and staying with her youngest daughter.  She is on 2 L O2 at home.  Today, pt ambulating 400' with supervision for lines.  She demonstrated steady balance and no significant weakness.  Pt reports feeling a little stiff, tired, and fatigues easily.  She was on 2 L O2 with all VSS.  Pt does not require further skilled PT services.  Recommend continued ambulation with staff for line management.        Recommendations for follow up therapy are one component of a multi-disciplinary discharge planning process, led by the attending physician.  Recommendations may be updated based on patient status, additional functional criteria and insurance authorization.  Follow Up Recommendations       Assistance Recommended at Discharge PRN  Patient can return home with the following       Equipment Recommendations None recommended by PT  Recommendations for Other Services       Functional Status Assessment Patient has not had a recent decline in their functional status     Precautions / Restrictions Precautions Precautions: None      Mobility  Bed Mobility Overal bed mobility: Modified Independent             General bed mobility comments: HOB elevated    Transfers Overall transfer level: Needs assistance Equipment used: None Transfers: Sit to/from Stand Sit to Stand: Supervision           General transfer comment: Supervision for line management    Ambulation/Gait Ambulation/Gait assistance: Supervision Gait Distance (Feet): 400  Feet Assistive device: None Gait Pattern/deviations: WFL(Within Functional Limits) Gait velocity: decreased     General Gait Details: Overalll WFL - slightly decreased speed but functional.  Supervision for eval purpose and line Astronomer    Modified Rankin (Stroke Patients Only)       Balance Overall balance assessment: Modified Independent   Sitting balance-Leahy Scale: Normal     Standing balance support: No upper extremity supported Standing balance-Leahy Scale: Good                               Pertinent Vitals/Pain Pain Assessment Pain Assessment: No/denies pain    Home Living Family/patient expects to be discharged to:: Private residence Living Arrangements: Children Available Help at Discharge: Family;Available 24 hours/day Type of Home: House Home Access: Level entry       Home Layout: One level Home Equipment: None Additional Comments: On 2 L O2 at home 24/7    Prior Function Prior Level of Function : Independent/Modified Independent             Mobility Comments: Ambulates without AD; can ambulate in community ADLs Comments: Ind with ADLs/selfcare, IADLs, home mgt, cooking, drives     Hand Dominance   Dominant Hand: Right    Extremity/Trunk Assessment   Upper Extremity Assessment Upper Extremity Assessment: Overall WFL for tasks assessed    Lower Extremity  Assessment Lower Extremity Assessment: Overall WFL for tasks assessed    Cervical / Trunk Assessment Cervical / Trunk Assessment: Normal  Communication   Communication: No difficulties  Cognition Arousal/Alertness: Awake/alert Behavior During Therapy: WFL for tasks assessed/performed Overall Cognitive Status: Within Functional Limits for tasks assessed                                          General Comments General comments (skin integrity, edema, etc.): Pt on 2 L O2 with sats 95% rest and 90% at  lowest ambulation.  HR 90's rest and low 100's walking (max 105 bpm).  Encouraged pt to ambulate at least 3xday with staff for line management    Exercises     Assessment/Plan    PT Assessment Patient does not need any further PT services  PT Problem List         PT Treatment Interventions      PT Goals (Current goals can be found in the Care Plan section)  Acute Rehab PT Goals Patient Stated Goal: to get back home PT Goal Formulation: All assessment and education complete, DC therapy    Frequency       Co-evaluation               AM-PAC PT "6 Clicks" Mobility  Outcome Measure Help needed turning from your back to your side while in a flat bed without using bedrails?: None Help needed moving from lying on your back to sitting on the side of a flat bed without using bedrails?: None Help needed moving to and from a bed to a chair (including a wheelchair)?: None Help needed standing up from a chair using your arms (e.g., wheelchair or bedside chair)?: None Help needed to walk in hospital room?: A Little Help needed climbing 3-5 steps with a railing? : A Little 6 Click Score: 22    End of Session Equipment Utilized During Treatment: Oxygen Activity Tolerance: Patient tolerated treatment well Patient left: in bed;with call bell/phone within reach;with bed alarm set Nurse Communication: Mobility status PT Visit Diagnosis: Difficulty in walking, not elsewhere classified (R26.2)    Time: 1610-9604 PT Time Calculation (min) (ACUTE ONLY): 14 min   Charges:   PT Evaluation $PT Eval Low Complexity: 1 Low          Ty Buntrock, PT Acute Rehab [facility] [phone]   Rayetta Humphrey 01/27/2023, 2:05 PM

## 2023-01-27 NOTE — TOC Progression Note (Addendum)
Transition of Care [facility]) - Progression Note    Patient Details  Name: Tracy Park MRN: [id number] Date of Birth: [birth year]  [facility] CM/SW Contact  Geni Bers, RN Phone Number: 01/27/2023, 12:55 PM  Clinical Narrative:    Spoke with pt in length concerning discharge plan. This CM over heard pt's daughter yelling, talking loud over the telephone to pt. Pt states that was her oldest daughter, did not believe she was in the hospital. Pt states that she lives with youngest daughter, sleeping on her couch and pay her to live there. Pt continues with explaining that she lost everything when her partner for 27 years passed, 2 years ago. Pt would like to find a place of her own to live. Her name is on the waiting list with [facility], [employer]. PT to eval. Pt agreed to go to SNF/rehab short term. Pt agreed to speak with Chaplain. Referral given to Chaplain here at [facility]. Will give pt resources for Seniors and information for ALF.    Expected Discharge Plan: Skilled Nursing Facility Barriers to Discharge: No Barriers Identified  Expected Discharge Plan and Services       Living arrangements for the past 2 months: Single Family Home                                       Social Determinants of Health (SDOH) Interventions SDOH Screenings   Food Insecurity: No Food Insecurity (01/25/2023)  Housing: Low Risk  (01/25/2023)  Transportation Needs: No Transportation Needs (01/25/2023)  Utilities: Not At Risk (01/25/2023)  Tobacco Use: High Risk (01/25/2023)    Readmission Risk Interventions    01/10/2023   11:16 AM 03/01/2022    9:00 AM  Readmission Risk Prevention Plan  Transportation Screening Complete Complete  PCP or Specialist Appt within 5-7 Days Complete Complete  Home Care Screening Complete Complete  Medication Review (RN CM) Complete Complete

## 2023-01-28 DIAGNOSIS — I4891 Unspecified atrial fibrillation: Secondary | ICD-10-CM | POA: Diagnosis not present

## 2023-01-28 LAB — COMPREHENSIVE METABOLIC PANEL
ALT: 12 U/L (ref 0–44)
AST: 11 U/L — ABNORMAL LOW (ref 15–41)
Albumin: 2.9 g/dL — ABNORMAL LOW (ref 3.5–5.0)
Alkaline Phosphatase: 56 U/L (ref 38–126)
Anion gap: 6 (ref 5–15)
BUN: 13 mg/dL (ref 8–23)
CO2: 30 mmol/L (ref 22–32)
Calcium: 8.5 mg/dL — ABNORMAL LOW (ref 8.9–10.3)
Chloride: 102 mmol/L (ref 98–111)
Creatinine, Ser: 0.56 mg/dL (ref 0.44–1.00)
GFR, Estimated: >60 mL/min (ref >60)
Glucose, Bld: 90 mg/dL (ref 70–99)
Potassium: 3.4 mmol/L — ABNORMAL LOW (ref 3.5–5.1)
Sodium: 138 mmol/L (ref 135–145)
Total Bilirubin: 0.4 mg/dL (ref 0.3–1.2)
Total Protein: 5.9 g/dL — ABNORMAL LOW (ref 6.5–8.1)

## 2023-01-28 LAB — CBC
HCT: 30.6 % — ABNORMAL LOW (ref 36.0–46.0)
Hemoglobin: 9.7 g/dL — ABNORMAL LOW (ref 12.0–15.0)
MCH: 29.1 pg (ref 26.0–34.0)
MCHC: 31.7 g/dL (ref 30.0–36.0)
MCV: 91.9 fL (ref 80.0–100.0)
Platelets: 222 10*3/uL (ref 150–400)
RBC: 3.33 MIL/uL — ABNORMAL LOW (ref 3.87–5.11)
RDW: 14.7 % (ref 11.5–15.5)
WBC: 9.1 10*3/uL (ref 4.0–10.5)
nRBC: 0 % (ref 0.0–0.2)

## 2023-01-28 LAB — LIPID PANEL
Cholesterol: 137 mg/dL (ref 0–200)
HDL: 69 mg/dL (ref >40)
LDL Cholesterol: 49 mg/dL (ref 0–99)
Total CHOL/HDL Ratio: 2 RATIO
Triglycerides: 96 mg/dL (ref <150)
VLDL: 19 mg/dL (ref 0–40)

## 2023-01-28 MED ORDER — POTASSIUM CHLORIDE CRYS ER 20 MEQ PO TBCR: 40.0000 meq | EXTENDED_RELEASE_TABLET | Freq: Once | ORAL | Status: DC

## 2023-01-28 MED ORDER — BOOST PLUS PO LIQD: 237.0000 mL | ORAL | Status: DC

## 2023-01-28 MED ORDER — BOOST PLUS PO LIQD
237.0000 mL | Freq: Once | ORAL | Status: DC
  Filled 2023-01-28: qty 237

## 2023-01-28 MED ORDER — POTASSIUM CHLORIDE CRYS ER 20 MEQ PO TBCR
60.0000 meq | EXTENDED_RELEASE_TABLET | Freq: Once | ORAL | Status: AC
  Administered 2023-01-28: 60 meq via ORAL
  Filled 2023-01-28: qty 3

## 2023-01-28 MED ORDER — AZITHROMYCIN 250 MG PO TABS: ORAL_TABLET | ORAL | 0 refills | Status: DC

## 2023-01-28 MED ORDER — PREDNISONE 10 MG PO TABS: ORAL_TABLET | ORAL | 0 refills | Status: AC

## 2023-01-28 MED ORDER — NICOTINE 14 MG/24HR TD PT24
14.0000 mg | MEDICATED_PATCH | Freq: Every day | TRANSDERMAL | Status: DC
  Administered 2023-01-28: 14 mg via TRANSDERMAL
  Filled 2023-01-28: qty 1

## 2023-01-28 MED ORDER — METOPROLOL SUCCINATE ER 25 MG PO TB24: 12.5000 mg | ORAL_TABLET | Freq: Every day | ORAL | 0 refills | Status: DC

## 2023-01-28 MED ORDER — LOPERAMIDE HCL 2 MG PO CAPS
2.0000 mg | ORAL_CAPSULE | ORAL | Status: DC | PRN
  Administered 2023-01-28: 2 mg via ORAL
  Filled 2023-01-28: qty 1

## 2023-01-28 MED ORDER — APIXABAN 5 MG PO TABS: 5.0000 mg | ORAL_TABLET | Freq: Two times a day (BID) | ORAL | 0 refills | Status: DC

## 2023-01-28 NOTE — Progress Notes (Signed)
Mobility Specialist - Progress Note   01/28/23 1230  Mobility  Activity Ambulated with assistance in hallway  Level of Assistance Standby assist, set-up cues, supervision of patient - no hands on  Assistive Device None  Distance Ambulated (ft) 500 ft  Range of Motion/Exercises Active  Activity Response Tolerated well  Mobility Referral Yes  $Mobility charge 1 Mobility  Mobility Specialist Start Time (ACUTE ONLY) 1200  Mobility Specialist Stop Time (ACUTE ONLY) 1213  Mobility Specialist Time Calculation (min) (ACUTE ONLY) 13 min   Pt received in bed and agreed to mobility. No c/o pain nor discomfort, O2 saturations did not drop lower than 92% on 2L.  Pt returned to bed with all needs met.  Marilynne Halsted Mobility Specialist

## 2023-01-28 NOTE — Discharge Summary (Signed)
Physician Discharge Summary   Patient: Tracy Park MRN: [id number] DOB: 03/01/1956  Admit date:     01/25/2023  Discharge date: 01/28/23  Discharge Physician: Rickey Barbara   PCP: Jacquelin Hawking, PA-C   Recommendations at discharge:    Follow up with PC in 1-2 weeks Follow up with primary Cardiologist at [facility] as scheduled Recommend repeat chest CT in 3-6 months to follow up on incidental pulmonary nodules  Discharge Diagnoses: Principal Problem:   Atrial fibrillation with RVR (HCC) Active Problems:   Hypothyroidism (acquired)   History of lung cancer   Chronic respiratory failure with hypoxia (HCC)   Pulmonary nodule   COPD (chronic obstructive pulmonary disease) (HCC)   HTN (hypertension)  Resolved Problems:   * No resolved hospital problems. *  Hospital Course: 67 y.o. female with medical history significant of COPD with chronic hypoxic respiratory failure on 2L, lung cancer s/p LUL resection in remission, OSA, CAD, HLD, anxiety, depression, Bipolar Type 2 who presents with chest pain.    Had acute mid-sternal chest pain that was dull and then sharp starting yesterday while watching TV. Pain is constant without alleviating or aggravating factors. Associated with increasing shortness of breath but was able to remain on baseline 2L via [place]. No worse chronic cough. Continues to smoke half a pack daily. No hx of significant cardiac history. No nausea or vomiting. Finally came into ED because daughter told her to.    In the ED, she was afebrile, normotensive and initially in normal sinus rhythm. However upon ambulation noted to be in atrial fibrillation with RVR with HR up to 170  Assessment and Plan: New Paroxysmal Atrial fibrillation with RVR (HCC) -newly diagnosed atrial fibrillation -troponin flat at 9 and then 8  -soon to be 67 with CHA2D2VASc score of 2, started eliquis -Initially started on cardizem gtt, however pt did not tolerate secondary to low BP and cardizem  was stopped -Pt was initially unable to tolerate metoprolol secondary to low BP as well and pt was later given one dose of IV amio -Appreciate input by Cardiology -TSH reviewed, normal -2d echo reviewed. Normal LVEF -Patient was later started on beta blocker. Cardiology recommends continuing toprol-XL 12.5mg  daily, eliquis 5mg  bid and to follow up closely with [facility]   History of lung cancer -s/p left upper lobectomy in remission -noted to have new pulmonary nodules in recent CT scan. She is aware and has been following with her pulmonologist at [facility] to get schedule follow up scans.   Hypothyroidism (acquired) -continue levothyroxine -TSH normal   HTN (hypertension) -given soft BP, have held home ARB   COPD (chronic obstructive pulmonary disease) (HCC) -audible wheezing this AM  -Have ordered PRN xopenex    Pulmonary nodule - CTA chest showed Increased patchy consolidation/atelectasis in the posterior right upper lobe in the area of the previously noted 7 mm nodule. This could be infectious/inflammatory, but is indeterminate. Continued close interval follow-up is recommended with noncontrast chest CT in 3-6 months. -will give trial of empiric zpak -procal is neg at <0.10   Chronic respiratory failure with hypoxia (HCC) -remains on baseline 2L        Consultants: Cardiology Procedures performed:   Disposition: Home Diet recommendation:  Cardiac diet DISCHARGE MEDICATION: Allergies as of 01/28/2023       Reactions   Red Dye Hives, Itching   Oxycontin [oxycodone Hcl] Other (See Comments)   Hallucinations    Prednisone Hives, Nausea And Vomiting   Aspirin Hives  Tape Rash   Prefers paper tape   Wound Dressing Adhesive Rash        Medication List     STOP taking these medications    furosemide 20 MG tablet Commonly known as: LASIX   losartan 25 MG tablet Commonly known as: COZAAR       TAKE these medications    albuterol 108 (90 Base)  MCG/ACT inhaler Commonly known as: VENTOLIN HFA Inhale 2 puffs into the lungs every 6 (six) hours as needed for wheezing or shortness of breath.   apixaban 5 MG Tabs tablet Commonly known as: ELIQUIS Take 1 tablet (5 mg total) by mouth 2 (two) times daily.   azithromycin 250 MG tablet Commonly known as: ZITHROMAX 1 tab po daily x 3 more days Start taking on: Jan 29, 2023   Breztri Aerosphere 160-9-4.8 MCG/ACT Aero Generic drug: Budeson-Glycopyrrol-Formoterol Inhale 1 puff into the lungs in the morning and at bedtime.   levothyroxine 125 MCG tablet Commonly known as: SYNTHROID Take 125 mcg by mouth daily before breakfast.   Magnesium Oxide 400 MG Caps Take 1 capsule (400 mg total) by mouth daily at 12 noon.   metoprolol succinate 25 MG 24 hr tablet Commonly known as: TOPROL-XL Take 0.5 tablets (12.5 mg total) by mouth daily. Start taking on: Jan 29, 2023   nitroGLYCERIN 0.4 MG SL tablet Commonly known as: NITROSTAT Place 0.4 mg under the tongue every 5 (five) minutes as needed for chest pain.   OXYGEN Inhale 2 L into the lungs daily as needed (for low oxygen).   pantoprazole 40 MG tablet Commonly known as: PROTONIX Take 40 mg by mouth daily.   predniSONE 10 MG tablet Commonly known as: DELTASONE Take 4 tablets (40 mg total) by mouth daily for 2 days, THEN 2 tablets (20 mg total) daily for 2 days, THEN 1 tablet (10 mg total) daily for 2 days, THEN 0.5 tablets (5 mg total) daily for 2 days. Start taking on: Jan 28, 2023 What changed: See the new instructions.   rosuvastatin 20 MG tablet Commonly known as: CRESTOR Take 20 mg by mouth daily.   tamsulosin 0.4 MG Caps capsule Commonly known as: FLOMAX Take 0.4 mg by mouth daily.        Follow-up Information     Jacquelin Hawking, PA-C Follow up in 2 week(s).   Specialty: Physician Assistant Contact information: [address]  [place] [address] [phone]         Follow up with your  Cardiologist at [facility] Follow up.   Why: Hospital follow up               Discharge Exam: Filed Weights   01/25/23 1809 01/25/23 1900 01/25/23 2322  Weight: 47.6 kg 49.9 kg 43.7 kg   General exam: Awake, laying in bed, in nad Respiratory system: Normal respiratory effort, no wheezing Cardiovascular system: regular rate, s1, s2 Gastrointestinal system: Soft, nondistended, positive BS Central nervous system: CN2-12 grossly intact, strength intact Extremities: Perfused, no clubbing Skin: Normal skin turgor, no notable skin lesions seen Psychiatry: Mood normal // no visual hallucinations   Condition at discharge: fair  The results of significant diagnostics from this hospitalization (including imaging, microbiology, ancillary and laboratory) are listed below for reference.   Imaging Studies: ECHOCARDIOGRAM COMPLETE  Result Date: 01/26/2023    ECHOCARDIOGRAM REPORT   Patient Name:   Tracy Park Date of Exam: 01/26/2023 Medical Rec #:  [id number]  Height:       63.0 in Accession #:    [id number]       Weight:       96.3 lb Date of Birth:  1956/09/02        BSA:          1.418 m Patient Age:    66 years         BP:           93/68 mmHg Patient Gender: F                HR:           52 bpm. Exam Location:  Inpatient Procedure: 2D Echo, Cardiac Doppler and Color Doppler Indications:    I48.91* Unspeicified atrial fibrillation  History:        Patient has no prior history of Echocardiogram examinations.                 Previous Myocardial Infarction; COPD.  Sonographer:    Mike Gip Referring Phys: [id number] CHING T TU IMPRESSIONS  1. Left ventricular ejection fraction, by estimation, is 60 to 65%. The left ventricle has normal function. The left ventricle has no regional wall motion abnormalities. Left ventricular diastolic parameters were normal.  2. Right ventricular systolic function is normal. The right ventricular size is normal. There is mildly elevated pulmonary artery  systolic pressure.  3. The mitral valve is normal in structure. No evidence of mitral valve regurgitation.  4. The aortic valve is tricuspid. Aortic valve regurgitation is not visualized.  5. The inferior vena cava is dilated in size with <50% respiratory variability, suggesting right atrial pressure of 15 mmHg. FINDINGS  Left Ventricle: Left ventricular ejection fraction, by estimation, is 60 to 65%. The left ventricle has normal function. The left ventricle has no regional wall motion abnormalities. The left ventricular internal cavity size was normal in size. There is  no left ventricular hypertrophy. Left ventricular diastolic parameters were normal. Right Ventricle: The right ventricular size is normal. Right ventricular systolic function is normal. There is mildly elevated pulmonary artery systolic pressure. The tricuspid regurgitant velocity is 2.55 m/s, and with an assumed right atrial pressure of 15 mmHg, the estimated right ventricular systolic pressure is 41.0 mmHg. Left Atrium: Left atrial size was normal in size. Right Atrium: Right atrial size was normal in size. Pericardium: There is no evidence of pericardial effusion. Mitral Valve: The mitral valve is normal in structure. There is mild calcification of the mitral valve leaflet(s). No evidence of mitral valve regurgitation. Tricuspid Valve: Tricuspid valve regurgitation is mild. Aortic Valve: The aortic valve is tricuspid. Aortic valve regurgitation is not visualized. Pulmonic Valve: Pulmonic valve regurgitation is not visualized. Aorta: The aortic root and ascending aorta are structurally normal, with no evidence of dilitation. Venous: The inferior vena cava is dilated in size with less than 50% respiratory variability, suggesting right atrial pressure of 15 mmHg. IAS/Shunts: The interatrial septum was not well visualized.  LEFT VENTRICLE PLAX 2D LVIDd:         3.90 cm     Diastology LVIDs:         2.30 cm     LV e' medial:    6.53 cm/s LV PW:          1.00 cm     LV E/e' medial:  12.7 LV IVS:        1.00 cm     LV e' lateral:   10.00 cm/s  LVOT diam:     2.00 cm     LV E/e' lateral: 8.3 LV SV:         75 LV SV Index:   53 LVOT Area:     3.14 cm  LV Volumes (MOD) LV vol d, MOD A2C: 76.8 ml LV vol d, MOD A4C: 54.2 ml LV vol s, MOD A2C: 29.6 ml LV vol s, MOD A4C: 20.2 ml LV SV MOD A2C:     47.2 ml LV SV MOD A4C:     54.2 ml LV SV MOD BP:      41.3 ml RIGHT VENTRICLE             IVC RV Basal diam:  3.40 cm     IVC diam: 2.40 cm RV S prime:     15.70 cm/s TAPSE (M-mode): 2.0 cm LEFT ATRIUM             Index        RIGHT ATRIUM          Index LA diam:        2.70 cm 1.90 cm/m   RA Area:     9.21 cm LA Vol (A2C):   27.6 ml 19.47 ml/m  RA Volume:   18.40 ml 12.98 ml/m LA Vol (A4C):   20.2 ml 14.25 ml/m LA Biplane Vol: 24.2 ml 17.07 ml/m  AORTIC VALVE LVOT Vmax:   112.00 cm/s LVOT Vmean:  63.500 cm/s LVOT VTI:    0.239 m  AORTA Ao Root diam: 3.00 cm Ao Asc diam:  2.70 cm MITRAL VALVE               TRICUSPID VALVE MV Area (PHT): 2.93 cm    TR Peak grad:   26.0 mmHg MV Decel Time: 259 msec    TR Vmax:        255.00 cm/s MV E velocity: 83.10 cm/s MV A velocity: 71.60 cm/s  SHUNTS MV E/A ratio:  1.16        Systemic VTI:  0.24 m                            Systemic Diam: 2.00 cm Carolan Clines Electronically signed by Carolan Clines Signature Date/Time: 01/26/2023/10:48:12 AM    Final    CT Angio Chest Pulmonary Embolism (PE) W or WO Contrast  Result Date: 01/25/2023 CLINICAL DATA:  Chest pain, PE suspected EXAM: CT ANGIOGRAPHY CHEST WITH CONTRAST TECHNIQUE: Multidetector CT imaging of the chest was performed using the standard protocol during bolus administration of intravenous contrast. Multiplanar CT image reconstructions and MIPs were obtained to evaluate the vascular anatomy. RADIATION DOSE REDUCTION: This exam was performed according to the departmental dose-optimization program which includes automated exposure control, adjustment of the mA and/or kV according to  patient size and/or use of iterative reconstruction technique. CONTRAST:  75mL OMNIPAQUE IOHEXOL 350 MG/ML SOLN COMPARISON:  Chest radiograph 01/25/2023 and CT chest 01/10/2023 FINDINGS: Cardiovascular: Satisfactory opacification of the pulmonary arteries to the segmental level. No pulmonary embolism. Normal heart size. No pericardial effusion. Aortic and coronary artery atherosclerotic calcification. No aortic aneurysm or dissection. Mediastinum/Nodes: Unremarkable esophagus. Borderline enlarged 9 mm short axis right hilar node (4/59) is not significantly changed. Lungs/Pleura: Advanced emphysema. Postoperative changes of left upper lobectomy. Mild diffuse bronchial wall thickening and scattered centrilobular micro nodules. Mucous plugging in the bilateral lower lobe bronchi. Increased patchy consolidation/atelectasis in the posterior right upper lobe in the area  of the previously noted 7 mm nodule. Scarring/atelectasis right lower lobe. 4 mm nodule in the medial right upper lobe (12/69 is unchanged from 2021 and should be benign. This nodule now measures approximately 9 mm. Similar atelectasis/scarring in the left upper lung. No pleural effusion or pneumothorax. Upper Abdomen: No acute abnormality. Musculoskeletal: No acute fracture. Review of the MIP images confirms the above findings. IMPRESSION: 1. No evidence of acute pulmonary embolism. 2. Increased patchy consolidation/atelectasis in the posterior right upper lobe in the area of the previously noted 7 mm nodule. This could be infectious/inflammatory, but is indeterminate. Continued close interval follow-up is recommended with noncontrast chest CT in 3-6 months. 3. Mild diffuse bronchial wall thickening and scattered centrilobular micro nodules, likely infectious/inflammatory. Mucous plugging in the bilateral lower lobe bronchi. 4. Advanced emphysema. Postoperative changes of left upper lobectomy. Aortic Atherosclerosis (ICD10-I70.0) and Emphysema  (ICD10-J43.9). Electronically Signed   By: Minerva Fester M.D.   On: 01/25/2023 20:07   DG Chest 2 View  Result Date: 01/25/2023 CLINICAL DATA:  Short of breath, history of left lung cancer EXAM: CHEST - 2 VIEW COMPARISON:  01/10/2023 FINDINGS: Frontal and lateral views of the chest demonstrate a stable cardiac silhouette. Stable postsurgical changes from left upper lobectomy, with pleural and parenchymal scarring and volume loss within the left upper hemithorax. Background emphysema again noted. No new consolidation, effusion, or pneumothorax. No acute bony abnormalities. IMPRESSION: 1. Postsurgical and post therapeutic changes related to prior left upper lobectomy. 2. Stable emphysema. 3. No acute airspace disease. 4. Please refer to previous chest CT report 01/10/2023 for a subcentimeter right upper lobe pulmonary nodule requiring follow-up. This is not well visualized by x-ray. Electronically Signed   By: Sharlet Salina M.D.   On: 01/25/2023 18:33   CT CHEST WO CONTRAST  Result Date: 01/10/2023 CLINICAL DATA:  Non-small cell lung cancer. Status post left upper lobectomy. EXAM: CT CHEST WITHOUT CONTRAST TECHNIQUE: Multidetector CT imaging of the chest was performed following the standard protocol without IV contrast. RADIATION DOSE REDUCTION: This exam was performed according to the departmental dose-optimization program which includes automated exposure control, adjustment of the mA and/or kV according to patient size and/or use of iterative reconstruction technique. COMPARISON:  CT chest abdomen and pelvis 03/30/2022 FINDINGS: Cardiovascular: No significant vascular findings. Normal heart size. No pericardial effusion. There are atherosclerotic calcifications is of the aorta. Mediastinum/Nodes: No enlarged mediastinal or axillary lymph nodes. Thyroid gland, trachea, and esophagus demonstrate no significant findings. Lungs/Pleura: Moderate emphysema is again seen. Left upper lobectomy changes are again  seen with pleural thickening in the left lung apex. Previously identified left upper lung airspace disease near the apex has resolved in the interval. There is no new focal lung infiltrate. Scattered areas of scarring or atelectasis are stable. There is some plugging of right lower lobe bronchi, new from prior. Patchy slightly nodular density in the posterior right upper lobe measures 7 mm (previously 5 mm). No new pulmonary nodules are seen. No pleural effusion. Upper Abdomen: No acute abnormality. Musculoskeletal: No chest wall mass or suspicious bone lesions identified. Cervical disc spacer present. IMPRESSION: 1. Left upper lobectomy changes. No evidence for local recurrence. 2. 7 mm right upper lobe pulmonary nodule has slightly increased in size when compared to prior. Continued close interval follow-up recommended. Non-contrast chest CT in 3-6 months is suggested. 3. New plugging of right lower lobe bronchi, likely infectious/inflammatory. Aortic Atherosclerosis (ICD10-I70.0) and Emphysema (ICD10-J43.9). Electronically Signed   By: Mcneil Sober.D.  On: 01/10/2023 19:26   DG CHEST PORT 1 VIEW  Result Date: 01/10/2023 CLINICAL DATA:  Wheezing EXAM: PORTABLE CHEST 1 VIEW COMPARISON:  01/07/2023 and older FINDINGS: Once again volume loss along the left hemithorax from prior resection. Apical pleural thickening and retraction. Shift of the mediastinum from right to left. Right lung is clear. Hyperinflated. No consolidation or pneumothorax. No edema. Normal cardiopericardial silhouette IMPRESSION: Stable postsurgical changes along the left hemithorax with volume loss and apical pleural thickening. Hyperinflation Electronically Signed   By: Karen Kays M.D.   On: 01/10/2023 12:22   DG Chest Port 1 View  Result Date: 01/07/2023 CLINICAL DATA:  Shortness of breath EXAM: PORTABLE CHEST 1 VIEW COMPARISON:  04/01/2022 FINDINGS: Cardiac shadow is stable. Chronic scarring is noted in the left apex with pleural  capping. No new focal infiltrate or effusion is seen. No bony abnormality is noted. IMPRESSION: Chronic scarring in the left apex.  No acute abnormality noted. Electronically Signed   By: Alcide Clever M.D.   On: 01/07/2023 23:01    Microbiology: Results for orders placed or performed during the hospital encounter of 01/25/23  MRSA Next Gen by PCR, Nasal     Status: None   Collection Time: 01/25/23 11:33 PM   Specimen: Nasal Mucosa; Nasal Swab  Result Value Ref Range Status   MRSA by PCR Next Gen NOT DETECTED NOT DETECTED Final    Comment: (NOTE) The GeneXpert MRSA Assay (FDA approved for NASAL specimens only), is one component of a comprehensive MRSA colonization surveillance program. It is not intended to diagnose MRSA infection nor to guide or monitor treatment for MRSA infections. Test performance is not FDA approved in patients less than 69 years old. Performed at [facility], [address], [place], [place] [address]     Labs: CBC: Recent Labs  Lab 01/25/23 1826 01/26/23 0251 01/27/23 0315 01/28/23 0512  WBC 10.6* 9.5 8.5 9.1  HGB 12.6 10.5* 10.9* 9.7*  HCT 40.6 34.9* 34.9* 30.6*  MCV 92.3 95.6 93.1 91.9  PLT 299 254 247 222   Basic Metabolic Panel: Recent Labs  Lab 01/25/23 1826 01/26/23 0251 01/27/23 0315 01/28/23 0512  NA 140 140 140 138  K 3.7 3.6 3.9 3.4*  CL 100 102 104 102  CO2 32 32 30 30  GLUCOSE 113* 103* 85 90  BUN 12 8 18 13   CREATININE 0.58 0.63 0.69 0.56  CALCIUM 9.1 8.2* 8.8* 8.5*  MG  --  2.1 1.9  --    Liver Function Tests: Recent Labs  Lab 01/27/23 0315 01/28/23 0512  AST 14* 11*  ALT 14 12  ALKPHOS 75 56  BILITOT 0.2* 0.4  PROT 6.4* 5.9*  ALBUMIN 3.1* 2.9*   CBG: No results for input(s): "GLUCAP" in the last 168 hours.  Discharge time spent: less than 30 minutes.  Signed: Rickey Barbara, MD [facility] 01/28/2023

## 2023-01-28 NOTE — Progress Notes (Signed)
Initial Nutrition Assessment  DOCUMENTATION CODES:   Severe malnutrition in context of chronic illness, Underweight  INTERVENTION:   -Boost Plus daily- Each supplement provides 360kcal and 14g protein.    -Multivitamin with minerals daily  NUTRITION DIAGNOSIS:   Severe Malnutrition related to chronic illness (COPD) as evidenced by severe fat depletion, severe muscle depletion.  GOAL:   Patient will meet greater than or equal to 90% of their needs  MONITOR:   PO intake, Supplement acceptance, Labs, Weight trends, I & O's  REASON FOR ASSESSMENT:    (Low BMI), history of malnutrition    ASSESSMENT:   67 y.o. female with medical history significant of COPD with chronic hypoxic respiratory failure on 2L, lung cancer s/p LUL resection in remission, OSA, CAD, HLD, anxiety, depression, Bipolar Type 2 who presents with chest pain.  Patient in room, states she ate an omelet, coffee and juice this morning for breakfast. Was looking at the menu for lunch ideas. Pt on regular diet. Pt states she tries to consume 3 meals a day at home. She lives with her daughter and they help provide meals.  Pt states she had an episode of diarrhea this morning, this was not occurring at home.  Pt states at home she drinks 1 Boost daily in the morning. She has not been taking a MVI but was when she was living in [place]. She is agreeable to restarting this.   Per weight records, pt has lost 8 lbs since July 2023.  Medications: MAG-OX, KLOR-CON, Imodium  Labs reviewed:  Low K  NUTRITION - FOCUSED PHYSICAL EXAM:  Flowsheet Row Most Recent Value  Orbital Region Severe depletion  Upper Arm Region Severe depletion  Thoracic and Lumbar Region Severe depletion  Buccal Region Severe depletion  Temple Region Severe depletion  Clavicle Bone Region Severe depletion  Clavicle and Acromion Bone Region Severe depletion  Scapular Bone Region Severe depletion  Dorsal Hand Severe depletion  Patellar Region  Severe depletion  Anterior Thigh Region Severe depletion  Posterior Calf Region Severe depletion  Edema (RD Assessment) None  Hair Reviewed  Eyes Reviewed  Mouth Reviewed  Skin Reviewed  Nails Reviewed       Diet Order:   Diet Order             Diet regular Room service appropriate? Yes; Fluid consistency: Thin  Diet effective now                   EDUCATION NEEDS:   No education needs have been identified at this time  Skin:  Skin Assessment: Reviewed RN Assessment  Last BM:  5/20  Height:   Ht Readings from Last 1 Encounters:  01/25/23 5\' 3"  (1.6 m)    Weight:   Wt Readings from Last 1 Encounters:  01/25/23 43.7 kg    BMI:  Body mass index is 17.07 kg/m.  Estimated Nutritional Needs:   Kcal:  1600-1800  Protein:  75-90g  Fluid:  1.8L/day  Tilda Franco, MS, RD, LDN Inpatient Clinical Dietitian Contact information available via Amion

## 2023-01-28 NOTE — TOC Transition Note (Addendum)
Transition of Care [facility]) - CM/SW Discharge Note   Patient Details  Name: Tracy Park MRN: [id number] Date of Birth: [birth year]  Transition of Care [facility]) CM/SW Contact:  Lanier Clam, RN Phone Number: 01/28/2023, 1:54 PM   Clinical Narrative:   Noted PT/OT no recc. Per patient d/c plan home. PTAR needed for transportation-confirmed address.on 02. PTAR called. No further CM  needs. -Per nsg patient found ride home. PTAR cancelled.   Final next level of care: Home/Self Care Barriers to Discharge: No Barriers Identified   Patient Goals and CMS Choice CMS Medicare.gov Compare Post Acute Care list provided to:: Patient Choice offered to / list presented to : Patient  Discharge Placement                         Discharge Plan and Services Additional resources added to the After Visit Summary for                                       Social Determinants of Health (SDOH) Interventions SDOH Screenings   Food Insecurity: No Food Insecurity (01/25/2023)  Housing: Low Risk  (01/25/2023)  Transportation Needs: No Transportation Needs (01/25/2023)  Utilities: Not At Risk (01/25/2023)  Tobacco Use: High Risk (01/25/2023)     Readmission Risk Interventions    01/10/2023   11:16 AM 03/01/2022    9:00 AM  Readmission Risk Prevention Plan  Transportation Screening Complete Complete  PCP or Specialist Appt within 5-7 Days Complete Complete  Home Care Screening Complete Complete  Medication Review (RN CM) Complete Complete

## 2023-01-28 NOTE — Progress Notes (Addendum)
Rounding Note    Patient Name: Tracy Park Date of Encounter: 01/28/2023  [facility] Cardiologist: Thomasene Ripple, DO   Patient would like to follow-up with [facility] cardiology  Subjective   Feeling good.  Denies any palpitations.  Having some shortness of breath with ambulation however much improved since admission.  Inpatient Medications    Scheduled Meds:  apixaban  5 mg Oral BID   azithromycin  500 mg Oral Daily   Chlorhexidine Gluconate Cloth  6 each Topical Daily   guaiFENesin  600 mg Oral BID   levothyroxine  125 mcg Oral Q0600   magnesium oxide  400 mg Oral Q1200   metoprolol succinate  12.5 mg Oral Daily   mometasone-formoterol  2 puff Inhalation BID   pantoprazole  40 mg Oral Daily   pneumococcal 20-valent conjugate vaccine  0.5 mL Intramuscular Tomorrow-1000   potassium chloride  60 mEq Oral Once   predniSONE  40 mg Oral Q breakfast   rosuvastatin  20 mg Oral Daily   tamsulosin  0.4 mg Oral Daily   umeclidinium bromide  1 puff Inhalation Daily   Continuous Infusions:  PRN Meds: acetaminophen, levalbuterol, mouth rinse   Vital Signs    Vitals:   01/27/23 2240 01/28/23 0448 01/28/23 0750 01/28/23 0801  BP: 119/68 125/80  111/77  Pulse: 79 71  75  Resp: 18 18  17   Temp: 98.8 F (37.1 C) 97.9 F (36.6 C)  97.9 F (36.6 C)  TempSrc: Oral Oral  Oral  SpO2: 98% 100% 95% 98%  Weight:      Height:        Intake/Output Summary (Last 24 hours) at 01/28/2023 0831 Last data filed at 01/27/2023 2249 Gross per 24 hour  Intake 600 ml  Output --  Net 600 ml      01/25/2023   11:22 PM 01/25/2023    7:00 PM 01/25/2023    6:09 PM  Last 3 Weights  Weight (lbs) 96 lb 5.5 oz 110 lb 105 lb  Weight (kg) 43.7 kg 49.896 kg 47.628 kg      Telemetry    Normal sinus rhythm with heart rates in the 70s- Personally Reviewed  ECG    No new tracings- Personally Reviewed  Physical Exam   GEN: No acute distress.   Neck: No JVD Cardiac: RRR, no  murmurs, rubs, or gallops.  Respiratory: + wheezing GI: Soft, nontender, non-distended  MS: No edema; No deformity. Neuro:  Nonfocal  Psych: Normal affect   Labs    High Sensitivity Troponin:   Recent Labs  Lab 01/07/23 2212 01/08/23 0024 01/25/23 1826 01/25/23 2002  TROPONINIHS 8 6 9 8      Chemistry Recent Labs  Lab 01/26/23 0251 01/27/23 0315 01/28/23 0512  NA 140 140 138  K 3.6 3.9 3.4*  CL 102 104 102  CO2 32 30 30  GLUCOSE 103* 85 90  BUN 8 18 13   CREATININE 0.63 0.69 0.56  CALCIUM 8.2* 8.8* 8.5*  MG 2.1 1.9  --   PROT  --  6.4* 5.9*  ALBUMIN  --  3.1* 2.9*  AST  --  14* 11*  ALT  --  14 12  ALKPHOS  --  75 56  BILITOT  --  0.2* 0.4  GFRNONAA >60 >60 >60  ANIONGAP 6 6 6     Lipids  Recent Labs  Lab 01/28/23 0512  CHOL 137  TRIG 96  HDL 69  LDLCALC 49  CHOLHDL 2.0  Hematology Recent Labs  Lab 01/26/23 0251 01/27/23 0315 01/28/23 0512  WBC 9.5 8.5 9.1  RBC 3.65* 3.75* 3.33*  HGB 10.5* 10.9* 9.7*  HCT 34.9* 34.9* 30.6*  MCV 95.6 93.1 91.9  MCH 28.8 29.1 29.1  MCHC 30.1 31.2 31.7  RDW 15.0 14.4 14.7  PLT 254 247 222   Thyroid  Recent Labs  Lab 01/26/23 0251  TSH 4.406    BNPNo results for input(s): "BNP", "PROBNP" in the last 168 hours.  DDimer No results for input(s): "DDIMER" in the last 168 hours.   Radiology    ECHOCARDIOGRAM COMPLETE  Result Date: 01/26/2023    ECHOCARDIOGRAM REPORT   Patient Name:   Tracy Park Date of Exam: 01/26/2023 Medical Rec #:  [id number]        Height:       63.0 in Accession #:    [id number]       Weight:       96.3 lb Date of Birth:  01-Jul-1956        BSA:          1.418 m Patient Age:    66 years         BP:           93/68 mmHg Patient Gender: F                HR:           52 bpm. Exam Location:  Inpatient Procedure: 2D Echo, Cardiac Doppler and Color Doppler Indications:    I48.91* Unspeicified atrial fibrillation  History:        Patient has no prior history of Echocardiogram examinations.                  Previous Myocardial Infarction; COPD.  Sonographer:    Mike Gip Referring Phys: [id number] CHING T TU IMPRESSIONS  1. Left ventricular ejection fraction, by estimation, is 60 to 65%. The left ventricle has normal function. The left ventricle has no regional wall motion abnormalities. Left ventricular diastolic parameters were normal.  2. Right ventricular systolic function is normal. The right ventricular size is normal. There is mildly elevated pulmonary artery systolic pressure.  3. The mitral valve is normal in structure. No evidence of mitral valve regurgitation.  4. The aortic valve is tricuspid. Aortic valve regurgitation is not visualized.  5. The inferior vena cava is dilated in size with <50% respiratory variability, suggesting right atrial pressure of 15 mmHg. FINDINGS  Left Ventricle: Left ventricular ejection fraction, by estimation, is 60 to 65%. The left ventricle has normal function. The left ventricle has no regional wall motion abnormalities. The left ventricular internal cavity size was normal in size. There is  no left ventricular hypertrophy. Left ventricular diastolic parameters were normal. Right Ventricle: The right ventricular size is normal. Right ventricular systolic function is normal. There is mildly elevated pulmonary artery systolic pressure. The tricuspid regurgitant velocity is 2.55 m/s, and with an assumed right atrial pressure of 15 mmHg, the estimated right ventricular systolic pressure is 41.0 mmHg. Left Atrium: Left atrial size was normal in size. Right Atrium: Right atrial size was normal in size. Pericardium: There is no evidence of pericardial effusion. Mitral Valve: The mitral valve is normal in structure. There is mild calcification of the mitral valve leaflet(s). No evidence of mitral valve regurgitation. Tricuspid Valve: Tricuspid valve regurgitation is mild. Aortic Valve: The aortic valve is tricuspid. Aortic valve regurgitation is not visualized. Pulmonic  Valve: Pulmonic  valve regurgitation is not visualized. Aorta: The aortic root and ascending aorta are structurally normal, with no evidence of dilitation. Venous: The inferior vena cava is dilated in size with less than 50% respiratory variability, suggesting right atrial pressure of 15 mmHg. IAS/Shunts: The interatrial septum was not well visualized.  LEFT VENTRICLE PLAX 2D LVIDd:         3.90 cm     Diastology LVIDs:         2.30 cm     LV e' medial:    6.53 cm/s LV PW:         1.00 cm     LV E/e' medial:  12.7 LV IVS:        1.00 cm     LV e' lateral:   10.00 cm/s LVOT diam:     2.00 cm     LV E/e' lateral: 8.3 LV SV:         75 LV SV Index:   53 LVOT Area:     3.14 cm  LV Volumes (MOD) LV vol d, MOD A2C: 76.8 ml LV vol d, MOD A4C: 54.2 ml LV vol s, MOD A2C: 29.6 ml LV vol s, MOD A4C: 20.2 ml LV SV MOD A2C:     47.2 ml LV SV MOD A4C:     54.2 ml LV SV MOD BP:      41.3 ml RIGHT VENTRICLE             IVC RV Basal diam:  3.40 cm     IVC diam: 2.40 cm RV S prime:     15.70 cm/s TAPSE (M-mode): 2.0 cm LEFT ATRIUM             Index        RIGHT ATRIUM          Index LA diam:        2.70 cm 1.90 cm/m   RA Area:     9.21 cm LA Vol (A2C):   27.6 ml 19.47 ml/m  RA Volume:   18.40 ml 12.98 ml/m LA Vol (A4C):   20.2 ml 14.25 ml/m LA Biplane Vol: 24.2 ml 17.07 ml/m  AORTIC VALVE LVOT Vmax:   112.00 cm/s LVOT Vmean:  63.500 cm/s LVOT VTI:    0.239 m  AORTA Ao Root diam: 3.00 cm Ao Asc diam:  2.70 cm MITRAL VALVE               TRICUSPID VALVE MV Area (PHT): 2.93 cm    TR Peak grad:   26.0 mmHg MV Decel Time: 259 msec    TR Vmax:        255.00 cm/s MV E velocity: 83.10 cm/s MV A velocity: 71.60 cm/s  SHUNTS MV E/A ratio:  1.16        Systemic VTI:  0.24 m                            Systemic Diam: 2.00 cm Carolan Clines Electronically signed by Carolan Clines Signature Date/Time: 01/26/2023/10:48:12 AM    Final     Cardiac Studies   Echocardiogram 01/26/2023 1. Left ventricular ejection fraction, by estimation, is 60 to  65%. The  left ventricle has normal function. The left ventricle has no regional  wall motion abnormalities. Left ventricular diastolic parameters were  normal.   2. Right ventricular systolic function is normal. The right ventricular  size is normal. There is mildly  elevated pulmonary artery systolic  pressure.   3. The mitral valve is normal in structure. No evidence of mitral valve  regurgitation.   4. The aortic valve is tricuspid. Aortic valve regurgitation is not  visualized.   5. The inferior vena cava is dilated in size with <50% respiratory  variability, suggesting right atrial pressure of 15 mmHg.   Patient Profile     67 y.o. female hx of COPD with chronic hypoxic respiratory failure on 2L chronically, lung cancer s/p LUL resection in remission, OSA, nonobstructive CAD by cor CT 2019 (25 to 50% LAD lesion), aortic atherosclerosis, HLD who is being evaluated for new onset of atrial fibrillation.  Assessment & Plan    New onset paroxysmal atrial fibrillation Initially presented with heart rates in the 170s. Although this is the first occurrence of documented atrial fibrillation, patient states that she has had episodes of intermittent palpitations for several months now in the setting of recurrent pneumonia infections.  She has not been able to tolerate diltiazem or metoprolol earlier on admission due to hypotension.    She has been maintaining normal sinus rhythm since May 19 at approximately 0100.  Heart rates in the 70s now.  Denies any palpitations.  Has slight shortness of breath with ambulation.  Feels much improved, however would like to stay 1 more day in order to get her housing arrangements for discharge.   Continue Toprol-XL 12.5 mg daily and eliquis 5mg  BID Hemoglobin 9.7.  No evidence of bleeding.  Continue to monitor. Echocardiogram showing normal LVEF of 60 to 65% and a normal sized LA Recommend getting a monitor post discharge to monitor atrial fibrillation burden.   She is followed by [facility] cardiology and will reestablish care with them. She has been instructed to follow up with them to get monitor placed. A1c 5.7  Pleuritic chest pain  Patient initially presented to the emergency room with new onset complaints of chest pain while watching television.  She had radiating pain going into her arms however this was in the setting of A-fib RVR and likely to be related to demand ischemia.  Additionally she has had a COPD exacerbation and has worsening chest pain with inhalation and cough.  EKG does not show any ischemic changes and troponins were negative. No complaints now.    Hypertension  Holding home losartan given soft pressures and hypotension. Now seems to be in a good range 120s systolic. Since she had hypotension even while in NSR, cannot clearly explain the hypotension from arrhythmia standpoint.   Nonobstructive CAD Hyperlipidemia Patient has reported history of normal Lexiscan 2022 and 25 to 50% LAD lesion based off [facility] cardiology notes.  However there are no records of any cardiac catheterizations. Continue Crestor 20 mg daily. LDL 49.  Hypokalemia Potassium 3.4.  Will supplement.   COPD OSA- uses night time CPAP, but has not had one for awhile. Need to get CPAP. Lung cancer in remission Hypothyroidism  For questions or updates, please contact [facility] Please consult [email or web address] for contact info under   Signed, Abagail Kitchens, PA-C  01/28/2023, 8:31 AM     Patient seen and examined, note reviewed with the signed Advanced Practice Provider. I personally reviewed laboratory data, imaging studies and relevant notes. I independently examined the patient and formulated the important aspects of the plan. I have personally discussed the plan with the patient and/or family. Comments or changes to the note/plan are indicated below.  Still in sinus rhythm.  Tolerated Toprol-XL  12.5 mg daily. Continue Eliquis 5 mg twice daily. From  a cardiovascular standpoint we will sign off.  [facility] will sign off.   Medication Recommendations: Toprol-XL 12.5 mg daily, Eliquis 5 mg twice a day Other recommendations (labs, testing, etc): None Follow up as an outpatient: She follows with [facility] cardiology.   Thomasene Ripple DO, MS [facility] Attending Cardiologist [facility]  [address] [place], [place] [address] [phone] Website: [email or web address]

## 2023-01-28 NOTE — Care Management Important Message (Signed)
Important Message  Patient Details IM Letter given. Name: Tracy Park MRN: [id number] Date of Birth: [birth year]   Medicare Important Message Given:  Yes     Caren Macadam 01/28/2023, 11:43 AM

## 2023-01-29 ENCOUNTER — Emergency Department (HOSPITAL_COMMUNITY)
Admission: EM | Admit: 2023-01-29 | Discharge: 2023-01-30 | Disposition: A | Discharge status: Home / Self Care | Payer: 59 | Attending: Emergency Medicine | Admitting: Emergency Medicine

## 2023-01-29 ENCOUNTER — Encounter (HOSPITAL_COMMUNITY): Payer: Self-pay | Admitting: Emergency Medicine

## 2023-01-29 ENCOUNTER — Emergency Department (HOSPITAL_COMMUNITY): Payer: 59

## 2023-01-29 ENCOUNTER — Other Ambulatory Visit: Payer: Self-pay

## 2023-01-29 DIAGNOSIS — J449 Chronic obstructive pulmonary disease, unspecified: Secondary | ICD-10-CM | POA: Diagnosis not present

## 2023-01-29 DIAGNOSIS — R0789 Other chest pain: Secondary | ICD-10-CM | POA: Diagnosis not present

## 2023-01-29 DIAGNOSIS — R079 Chest pain, unspecified: Secondary | ICD-10-CM | POA: Diagnosis present

## 2023-01-29 DIAGNOSIS — Z7901 Long term (current) use of anticoagulants: Secondary | ICD-10-CM | POA: Diagnosis not present

## 2023-01-29 DIAGNOSIS — J439 Emphysema, unspecified: Secondary | ICD-10-CM

## 2023-01-29 DIAGNOSIS — J430 Unilateral pulmonary emphysema [MacLeod's syndrome]: Secondary | ICD-10-CM | POA: Diagnosis not present

## 2023-01-29 LAB — BASIC METABOLIC PANEL
Anion gap: 11 (ref 5–15)
BUN: 15 mg/dL (ref 8–23)
CO2: 30 mmol/L (ref 22–32)
Calcium: 9.2 mg/dL (ref 8.9–10.3)
Chloride: 101 mmol/L (ref 98–111)
Creatinine, Ser: 0.57 mg/dL (ref 0.44–1.00)
GFR, Estimated: >60 mL/min (ref >60)
Glucose, Bld: 104 mg/dL — ABNORMAL HIGH (ref 70–99)
Potassium: 3.6 mmol/L (ref 3.5–5.1)
Sodium: 142 mmol/L (ref 135–145)

## 2023-01-29 LAB — CBC
HCT: 37.4 % (ref 36.0–46.0)
Hemoglobin: 11.9 g/dL — ABNORMAL LOW (ref 12.0–15.0)
MCH: 29.2 pg (ref 26.0–34.0)
MCHC: 31.8 g/dL (ref 30.0–36.0)
MCV: 91.7 fL (ref 80.0–100.0)
Platelets: 304 10*3/uL (ref 150–400)
RBC: 4.08 MIL/uL (ref 3.87–5.11)
RDW: 15.4 % (ref 11.5–15.5)
WBC: 10.7 10*3/uL — ABNORMAL HIGH (ref 4.0–10.5)
nRBC: 0 % (ref 0.0–0.2)

## 2023-01-29 LAB — TROPONIN I (HIGH SENSITIVITY): Troponin I (High Sensitivity): 7 ng/L (ref <18)

## 2023-01-29 NOTE — ED Triage Notes (Signed)
Pt c/o centralized chest pain ongoing for a week but worsening last night. Endorses shortness of breath. Pt states she was recently admitted for afib rvr and copd exacerbation - d/c yesterday. 2L home o2.

## 2023-01-30 DIAGNOSIS — R0789 Other chest pain: Secondary | ICD-10-CM | POA: Diagnosis not present

## 2023-01-30 LAB — TROPONIN I (HIGH SENSITIVITY): Troponin I (High Sensitivity): 8 ng/L (ref <18)

## 2023-01-30 MED ORDER — ACETAMINOPHEN 325 MG PO TABS
650.0000 mg | ORAL_TABLET | Freq: Once | ORAL | Status: AC
  Administered 2023-01-30: 650 mg via ORAL
  Filled 2023-01-30: qty 2

## 2023-01-30 MED ORDER — ALBUTEROL SULFATE (2.5 MG/3ML) 0.083% IN NEBU: 2.5000 mg | INHALATION_SOLUTION | Freq: Four times a day (QID) | RESPIRATORY_TRACT | 0 refills | Status: DC | PRN

## 2023-01-30 MED ORDER — ALBUTEROL SULFATE (2.5 MG/3ML) 0.083% IN NEBU
5.0000 mg | INHALATION_SOLUTION | Freq: Once | RESPIRATORY_TRACT | Status: AC
  Administered 2023-01-30: 5 mg via RESPIRATORY_TRACT
  Filled 2023-01-30: qty 6

## 2023-01-30 NOTE — ED Provider Notes (Signed)
[place] EMERGENCY DEPARTMENT AT [facility] Provider Note   CSN: [id number] Arrival date & time: 01/29/23  2040     History  Chief Complaint  Patient presents with   Chest Pain    Kharter L Glasco is a 67 y.o. female.  The history is provided by the patient.  Chest Pain Uchenna L Boulton is a 67 y.o. female who presents to the Emergency Department complaining of chest pain.  She presents to the emergency department accompanied by her daughter for evaluation of central chest pain.  She was recently admitted to the hospital for COPD exacerbation and new onset A-fib and was discharged home on metoprolol, Eliquis, azithromycin and prednisone.  She states that since hospital discharge she was doing well and has been able to take her medications aside from the metoprolol.  Her pain was resolved until last night when she developed central chest pain that is dull to excruciating in nature.  She states that her heart is beating irregularly.  No fever, cough.  She does have shortness of breath.  She has COPD and is on home oxygen at 2 L at baseline.  No associate abdominal pain, nausea, vomiting.  She has also bilateral lower extremity edema.      Home Medications Prior to Admission medications   Medication Sig Start Date End Date Taking? Authorizing Provider  albuterol (PROVENTIL) (2.5 MG/3ML) 0.083% nebulizer solution Take 3 mLs (2.5 mg total) by nebulization every 6 (six) hours as needed for wheezing or shortness of breath. 01/30/23  Yes Tilden Fossa, MD  albuterol (VENTOLIN HFA) 108 (90 Base) MCG/ACT inhaler Inhale 2 puffs into the lungs every 6 (six) hours as needed for wheezing or shortness of breath. 01/13/23   Burnadette Pop, MD  apixaban (ELIQUIS) 5 MG TABS tablet Take 1 tablet (5 mg total) by mouth 2 (two) times daily. 01/28/23 02/27/23  Jerald Kief, MD  azithromycin (ZITHROMAX) 250 MG tablet 1 tab po daily x 3 more days 01/29/23   Jerald Kief, MD   Budeson-Glycopyrrol-Formoterol (BREZTRI AEROSPHERE) 160-9-4.8 MCG/ACT AERO Inhale 1 puff into the lungs in the morning and at bedtime.    [provider]  levothyroxine (SYNTHROID) 125 MCG tablet Take 125 mcg by mouth daily before breakfast.    [provider]  Magnesium Oxide 400 MG CAPS Take 1 capsule (400 mg total) by mouth daily at 12 noon. 03/01/22   Albertine Grates, MD  metoprolol succinate (TOPROL-XL) 25 MG 24 hr tablet Take 0.5 tablets (12.5 mg total) by mouth daily. 01/29/23 02/28/23  Jerald Kief, MD  nitroGLYCERIN (NITROSTAT) 0.4 MG SL tablet Place 0.4 mg under the tongue every 5 (five) minutes as needed for chest pain.    [provider]  OXYGEN Inhale 2 L into the lungs daily as needed (for low oxygen).    [provider]  pantoprazole (PROTONIX) 40 MG tablet Take 40 mg by mouth daily.    [provider]  predniSONE (DELTASONE) 10 MG tablet Take 4 tablets (40 mg total) by mouth daily for 2 days, THEN 2 tablets (20 mg total) daily for 2 days, THEN 1 tablet (10 mg total) daily for 2 days, THEN 0.5 tablets (5 mg total) daily for 2 days. 01/28/23 02/05/23  Jerald Kief, MD  rosuvastatin (CRESTOR) 20 MG tablet Take 20 mg by mouth daily. 01/15/20   [provider]  tamsulosin (FLOMAX) 0.4 MG CAPS capsule Take 0.4 mg by mouth daily. 03/27/22   [provider]      Allergies    Red dye, Oxycontin [oxycodone hcl], Prednisone, Aspirin, Tape, and Wound dressing adhesive    Review of Systems   Review of Systems  Cardiovascular:  Positive for chest pain.  All other systems reviewed and are negative.   Physical Exam Updated Vital Signs BP 131/79   Pulse 73   Temp 98.2 F (36.8 C) (Oral)   Resp (!) 24   Ht 5\' 3"  (1.6 m)   Wt 47.6 kg   SpO2 97%   BMI 18.60 kg/m  Physical Exam Vitals and nursing note reviewed.  Constitutional:      Appearance: She is well-developed.  HENT:     Head: Normocephalic and atraumatic.   Cardiovascular:     Rate and Rhythm: Normal rate and regular rhythm.     Heart sounds: No murmur heard. Pulmonary:     Effort: Pulmonary effort is normal. No respiratory distress.     Comments: Wheezes bilaterally without respiratory distress Abdominal:     Palpations: Abdomen is soft.     Tenderness: There is no abdominal tenderness. There is no guarding or rebound.  Musculoskeletal:        General: No swelling or tenderness.  Skin:    General: Skin is warm and dry.  Neurological:     Mental Status: She is alert and oriented to person, place, and time.  Psychiatric:        Behavior: Behavior normal.     ED Results / Procedures / Treatments   Labs (all labs ordered are listed, but only abnormal results are displayed) Labs Reviewed  BASIC METABOLIC PANEL - Abnormal; Notable for the following components:      Result Value   Glucose, Bld 104 (*)    All other components within normal limits  CBC - Abnormal; Notable for the following components:   WBC 10.7 (*)    Hemoglobin 11.9 (*)    All other components within normal limits  TROPONIN I (HIGH SENSITIVITY)  TROPONIN I (HIGH SENSITIVITY)    EKG EKG Interpretation  Date/Time:  Wednesday Jan 29 2023 20:50:08 EDT Ventricular Rate:  103 PR Interval:  126 QRS Duration: 107 QT Interval:  379 QTC Calculation: 494 R Axis:   -70 Text Interpretation: Sinus tachycardia Right atrial enlargement RSR' in V1 or V2, probably normal variant Left ventricular hypertrophy Borderline ST elevation, lateral leads Borderline prolonged QT interval Confirmed by Tilden Fossa [phone]) on 01/30/2023 12:23:35 AM  Radiology DG Chest 2 View  Result Date: 01/29/2023 CLINICAL DATA:  Chest pain, short of breath, atrial fibrillation EXAM: CHEST - 2 VIEW COMPARISON:  01/25/2023 FINDINGS: Frontal and lateral views of the chest demonstrate a stable cardiac silhouette. Postsurgical changes from left upper lobectomy. Stable emphysema. The nodular  consolidation seen within the posterior right upper lobe on recent CT is faintly visible by x-ray. No significant change. No effusion or pneumothorax. No acute bony abnormalities. IMPRESSION: 1. Faint nodular airspace disease within the right upper lobe, unchanged since recent CT exam. 2. Emphysema, with stable postsurgical changes from left upper lobectomy. Electronically Signed   By: Sharlet Salina M.D.   On: 01/29/2023 22:05    Procedures Procedures    Medications Ordered in ED Medications  albuterol (PROVENTIL) (2.5 MG/3ML) 0.083% nebulizer solution 5 mg (5 mg Nebulization Given 01/30/23 0117)  acetaminophen (TYLENOL) tablet 650 mg (650 mg Oral Given 01/30/23 0116)    ED Course/ Medical Decision Making/ A&P  Medical Decision Making Amount and/or Complexity of Data Reviewed Labs: ordered. Radiology: ordered.  Risk OTC drugs. Prescription drug management.   Patient with history of COPD on home oxygen, recent diagnosis of A-fib on anticoagulation but she has not filled her metoprolol yet.  She will have this available at the pharmacy this morning.  She does complain of chest pain, overall she is comfortable appearing.  She does have an occasional wheeze on examination with no respiratory distress.  She was treated with a dose of albuterol with resolution of her chest discomfort.  Heart rates are controlled during her ED stay.  Chest x-ray is negative for acute pneumonia or acute process-images personally reviewed and interpreted, agree with radiologist interpretation.  CBC with very mild leukocytosis, she was recently started on steroids, doubt superimposed secondary infection at this time.  Troponins are negative x 2.  Current clinical picture is not consistent with ACS, PE, COPD exacerbation, pneumonia.  Suspect that her symptoms were secondary to episode of A-fib with RVR at home, which has since resolved.  She will have her metoprolol available this morning  from the pharmacy according to daughter.  Feel she is stable at this point for discharge home with outpatient follow-up and return precautions.       Final Clinical Impression(s) / ED Diagnoses Final diagnoses:  Atypical chest pain  Pulmonary emphysema, unspecified emphysema type (HCC)    Rx / DC Orders ED Discharge Orders          Ordered    albuterol (PROVENTIL) (2.5 MG/3ML) 0.083% nebulizer solution  Every 6 hours PRN        01/30/23 0108              Tilden Fossa, MD 01/30/23 0214

## 2023-02-23 ENCOUNTER — Encounter (HOSPITAL_COMMUNITY): Payer: Self-pay

## 2023-02-23 ENCOUNTER — Other Ambulatory Visit: Payer: Self-pay

## 2023-02-23 ENCOUNTER — Inpatient Hospital Stay (HOSPITAL_COMMUNITY)
Admission: EM | Admit: 2023-02-23 | Discharge: 2023-02-26 | DRG: 190 | Disposition: A | Discharge status: Home / Self Care | Payer: 59 | Attending: Family Medicine | Admitting: Family Medicine

## 2023-02-23 ENCOUNTER — Emergency Department (HOSPITAL_COMMUNITY): Payer: 59

## 2023-02-23 DIAGNOSIS — E785 Hyperlipidemia, unspecified: Secondary | ICD-10-CM | POA: Diagnosis present

## 2023-02-23 DIAGNOSIS — Z91018 Allergy to other foods: Secondary | ICD-10-CM

## 2023-02-23 DIAGNOSIS — R911 Solitary pulmonary nodule: Secondary | ICD-10-CM | POA: Diagnosis present

## 2023-02-23 DIAGNOSIS — Z7901 Long term (current) use of anticoagulants: Secondary | ICD-10-CM

## 2023-02-23 DIAGNOSIS — J9621 Acute and chronic respiratory failure with hypoxia: Secondary | ICD-10-CM | POA: Diagnosis present

## 2023-02-23 DIAGNOSIS — G4730 Sleep apnea, unspecified: Secondary | ICD-10-CM | POA: Diagnosis present

## 2023-02-23 DIAGNOSIS — J159 Unspecified bacterial pneumonia: Secondary | ICD-10-CM | POA: Diagnosis present

## 2023-02-23 DIAGNOSIS — Z66 Do not resuscitate: Secondary | ICD-10-CM | POA: Diagnosis present

## 2023-02-23 DIAGNOSIS — Z885 Allergy status to narcotic agent status: Secondary | ICD-10-CM

## 2023-02-23 DIAGNOSIS — D638 Anemia in other chronic diseases classified elsewhere: Secondary | ICD-10-CM | POA: Diagnosis present

## 2023-02-23 DIAGNOSIS — I252 Old myocardial infarction: Secondary | ICD-10-CM | POA: Diagnosis not present

## 2023-02-23 DIAGNOSIS — J441 Chronic obstructive pulmonary disease with (acute) exacerbation: Principal | ICD-10-CM | POA: Diagnosis present

## 2023-02-23 DIAGNOSIS — I2510 Atherosclerotic heart disease of native coronary artery without angina pectoris: Secondary | ICD-10-CM | POA: Diagnosis present

## 2023-02-23 DIAGNOSIS — F4310 Post-traumatic stress disorder, unspecified: Secondary | ICD-10-CM | POA: Diagnosis present

## 2023-02-23 DIAGNOSIS — Z85118 Personal history of other malignant neoplasm of bronchus and lung: Secondary | ICD-10-CM

## 2023-02-23 DIAGNOSIS — H5461 Unqualified visual loss, right eye, normal vision left eye: Secondary | ICD-10-CM | POA: Diagnosis present

## 2023-02-23 DIAGNOSIS — J9611 Chronic respiratory failure with hypoxia: Secondary | ICD-10-CM | POA: Diagnosis present

## 2023-02-23 DIAGNOSIS — I10 Essential (primary) hypertension: Secondary | ICD-10-CM | POA: Diagnosis present

## 2023-02-23 DIAGNOSIS — E890 Postprocedural hypothyroidism: Secondary | ICD-10-CM | POA: Diagnosis present

## 2023-02-23 DIAGNOSIS — Z79899 Other long term (current) drug therapy: Secondary | ICD-10-CM | POA: Diagnosis not present

## 2023-02-23 DIAGNOSIS — J9811 Atelectasis: Secondary | ICD-10-CM | POA: Diagnosis present

## 2023-02-23 DIAGNOSIS — I480 Paroxysmal atrial fibrillation: Secondary | ICD-10-CM | POA: Diagnosis present

## 2023-02-23 DIAGNOSIS — Z634 Disappearance and death of family member: Secondary | ICD-10-CM

## 2023-02-23 DIAGNOSIS — F319 Bipolar disorder, unspecified: Secondary | ICD-10-CM | POA: Diagnosis present

## 2023-02-23 DIAGNOSIS — Z87442 Personal history of urinary calculi: Secondary | ICD-10-CM

## 2023-02-23 DIAGNOSIS — Z9109 Other allergy status, other than to drugs and biological substances: Secondary | ICD-10-CM

## 2023-02-23 DIAGNOSIS — Z888 Allergy status to other drugs, medicaments and biological substances status: Secondary | ICD-10-CM | POA: Diagnosis not present

## 2023-02-23 DIAGNOSIS — F17210 Nicotine dependence, cigarettes, uncomplicated: Secondary | ICD-10-CM | POA: Diagnosis present

## 2023-02-23 DIAGNOSIS — K219 Gastro-esophageal reflux disease without esophagitis: Secondary | ICD-10-CM | POA: Diagnosis present

## 2023-02-23 DIAGNOSIS — R45851 Suicidal ideations: Secondary | ICD-10-CM | POA: Diagnosis present

## 2023-02-23 DIAGNOSIS — Z91041 Radiographic dye allergy status: Secondary | ICD-10-CM

## 2023-02-23 DIAGNOSIS — Z5900 Homelessness unspecified: Secondary | ICD-10-CM

## 2023-02-23 DIAGNOSIS — Z79890 Hormone replacement therapy (postmenopausal): Secondary | ICD-10-CM | POA: Diagnosis not present

## 2023-02-23 DIAGNOSIS — Z9151 Personal history of suicidal behavior: Secondary | ICD-10-CM

## 2023-02-23 DIAGNOSIS — Z90710 Acquired absence of both cervix and uterus: Secondary | ICD-10-CM

## 2023-02-23 DIAGNOSIS — F4322 Adjustment disorder with anxiety: Secondary | ICD-10-CM | POA: Diagnosis present

## 2023-02-23 DIAGNOSIS — Z91048 Other nonmedicinal substance allergy status: Secondary | ICD-10-CM

## 2023-02-23 DIAGNOSIS — Z886 Allergy status to analgesic agent status: Secondary | ICD-10-CM

## 2023-02-23 LAB — BLOOD GAS, VENOUS
Acid-Base Excess: 7.3 mmol/L — ABNORMAL HIGH (ref 0.0–2.0)
Bicarbonate: 34.3 mmol/L — ABNORMAL HIGH (ref 20.0–28.0)
O2 Saturation: 60 %
Patient temperature: 37
pCO2, Ven: 58 mmHg (ref 44–60)
pH, Ven: 7.38 (ref 7.25–7.43)
pO2, Ven: 31 mmHg — CL (ref 32–45)

## 2023-02-23 LAB — CBC WITH DIFFERENTIAL/PLATELET
Abs Immature Granulocytes: 0.02 10*3/uL (ref 0.00–0.07)
Basophils Absolute: 0 10*3/uL (ref 0.0–0.1)
Basophils Relative: 1 %
Eosinophils Absolute: 0.1 10*3/uL (ref 0.0–0.5)
Eosinophils Relative: 1 %
HCT: 31.1 % — ABNORMAL LOW (ref 36.0–46.0)
Hemoglobin: 9.7 g/dL — ABNORMAL LOW (ref 12.0–15.0)
Immature Granulocytes: 0 %
Lymphocytes Relative: 20 %
Lymphs Abs: 1.6 10*3/uL (ref 0.7–4.0)
MCH: 29.3 pg (ref 26.0–34.0)
MCHC: 31.2 g/dL (ref 30.0–36.0)
MCV: 94 fL (ref 80.0–100.0)
Monocytes Absolute: 0.4 10*3/uL (ref 0.1–1.0)
Monocytes Relative: 5 %
Neutro Abs: 5.6 10*3/uL (ref 1.7–7.7)
Neutrophils Relative %: 73 %
Platelets: 235 10*3/uL (ref 150–400)
RBC: 3.31 MIL/uL — ABNORMAL LOW (ref 3.87–5.11)
RDW: 13.6 % (ref 11.5–15.5)
WBC: 7.7 10*3/uL (ref 4.0–10.5)
nRBC: 0 % (ref 0.0–0.2)

## 2023-02-23 LAB — BRAIN NATRIURETIC PEPTIDE: B Natriuretic Peptide: 76.3 pg/mL (ref 0.0–100.0)

## 2023-02-23 LAB — COMPREHENSIVE METABOLIC PANEL
ALT: 11 U/L (ref 0–44)
AST: 18 U/L (ref 15–41)
Albumin: 3.4 g/dL — ABNORMAL LOW (ref 3.5–5.0)
Alkaline Phosphatase: 68 U/L (ref 38–126)
Anion gap: 8 (ref 5–15)
BUN: 15 mg/dL (ref 8–23)
CO2: 26 mmol/L (ref 22–32)
Calcium: 8.6 mg/dL — ABNORMAL LOW (ref 8.9–10.3)
Chloride: 107 mmol/L (ref 98–111)
Creatinine, Ser: 0.52 mg/dL (ref 0.44–1.00)
GFR, Estimated: >60 mL/min (ref >60)
Glucose, Bld: 150 mg/dL — ABNORMAL HIGH (ref 70–99)
Potassium: 3.9 mmol/L (ref 3.5–5.1)
Sodium: 141 mmol/L (ref 135–145)
Total Bilirubin: 0.4 mg/dL (ref 0.3–1.2)
Total Protein: 6.5 g/dL (ref 6.5–8.1)

## 2023-02-23 LAB — TROPONIN I (HIGH SENSITIVITY)
Troponin I (High Sensitivity): 5 ng/L (ref <18)
Troponin I (High Sensitivity): 5 ng/L (ref <18)

## 2023-02-23 MED ORDER — LEVOTHYROXINE SODIUM 25 MCG PO TABS
125.0000 ug | ORAL_TABLET | Freq: Every day | ORAL | Status: DC
  Administered 2023-02-24 – 2023-02-26 (×3): 125 ug via ORAL
  Filled 2023-02-23 (×3): qty 1

## 2023-02-23 MED ORDER — METOPROLOL SUCCINATE ER 25 MG PO TB24
12.5000 mg | ORAL_TABLET | Freq: Every day | ORAL | Status: DC
  Administered 2023-02-24 – 2023-02-26 (×3): 12.5 mg via ORAL
  Filled 2023-02-23 (×3): qty 1

## 2023-02-23 MED ORDER — PANTOPRAZOLE SODIUM 40 MG PO TBEC
40.0000 mg | DELAYED_RELEASE_TABLET | Freq: Every day | ORAL | Status: DC
  Administered 2023-02-24 – 2023-02-26 (×3): 40 mg via ORAL
  Filled 2023-02-23 (×3): qty 1

## 2023-02-23 MED ORDER — ALBUTEROL SULFATE (2.5 MG/3ML) 0.083% IN NEBU
15.0000 mg/h | INHALATION_SOLUTION | Freq: Once | RESPIRATORY_TRACT | Status: AC
  Administered 2023-02-23: 15 mg/h via RESPIRATORY_TRACT
  Filled 2023-02-23: qty 18

## 2023-02-23 MED ORDER — ACETAMINOPHEN 650 MG RE SUPP: 650.0000 mg | Freq: Four times a day (QID) | RECTAL | Status: DC | PRN

## 2023-02-23 MED ORDER — SODIUM CHLORIDE 0.9 % IV SOLN
1.0000 g | INTRAVENOUS | Status: DC
  Administered 2023-02-23 – 2023-02-25 (×3): 1 g via INTRAVENOUS
  Filled 2023-02-23 (×3): qty 10

## 2023-02-23 MED ORDER — TAMSULOSIN HCL 0.4 MG PO CAPS
0.4000 mg | ORAL_CAPSULE | Freq: Every day | ORAL | Status: DC
  Administered 2023-02-24 – 2023-02-26 (×3): 0.4 mg via ORAL
  Filled 2023-02-23 (×3): qty 1

## 2023-02-23 MED ORDER — ALBUTEROL SULFATE (2.5 MG/3ML) 0.083% IN NEBU
2.5000 mg | INHALATION_SOLUTION | RESPIRATORY_TRACT | Status: DC | PRN
  Administered 2023-02-24: 2.5 mg via RESPIRATORY_TRACT
  Filled 2023-02-23: qty 3

## 2023-02-23 MED ORDER — IPRATROPIUM BROMIDE 0.02 % IN SOLN
1.0000 mg | Freq: Once | RESPIRATORY_TRACT | Status: AC
  Administered 2023-02-23: 1 mg via RESPIRATORY_TRACT
  Filled 2023-02-23: qty 5

## 2023-02-23 MED ORDER — OXYCODONE HCL 5 MG PO TABS: 5.0000 mg | ORAL_TABLET | ORAL | Status: DC | PRN

## 2023-02-23 MED ORDER — POLYETHYLENE GLYCOL 3350 17 G PO PACK: 17.0000 g | PACK | Freq: Every day | ORAL | Status: DC | PRN

## 2023-02-23 MED ORDER — AZITHROMYCIN 250 MG PO TABS
500.0000 mg | ORAL_TABLET | Freq: Every day | ORAL | Status: AC
  Administered 2023-02-23 – 2023-02-25 (×3): 500 mg via ORAL
  Filled 2023-02-23 (×3): qty 2

## 2023-02-23 MED ORDER — PREDNISONE 20 MG PO TABS: 40.0000 mg | ORAL_TABLET | Freq: Every day | ORAL | Status: DC

## 2023-02-23 MED ORDER — APIXABAN 5 MG PO TABS
5.0000 mg | ORAL_TABLET | Freq: Two times a day (BID) | ORAL | Status: DC
  Administered 2023-02-23 – 2023-02-26 (×6): 5 mg via ORAL
  Filled 2023-02-23 (×6): qty 1

## 2023-02-23 MED ORDER — IPRATROPIUM-ALBUTEROL 0.5-2.5 (3) MG/3ML IN SOLN
3.0000 mL | Freq: Three times a day (TID) | RESPIRATORY_TRACT | Status: DC
  Administered 2023-02-23 – 2023-02-24 (×2): 3 mL via RESPIRATORY_TRACT
  Filled 2023-02-23: qty 3

## 2023-02-23 MED ORDER — LORAZEPAM 1 MG PO TABS
1.0000 mg | ORAL_TABLET | Freq: Once | ORAL | Status: AC
  Administered 2023-02-23: 1 mg via ORAL
  Filled 2023-02-23: qty 1

## 2023-02-23 MED ORDER — ACETAMINOPHEN 325 MG PO TABS: 650.0000 mg | ORAL_TABLET | Freq: Four times a day (QID) | ORAL | Status: DC | PRN

## 2023-02-23 MED ORDER — MAGNESIUM SULFATE 2 GM/50ML IV SOLN
2.0000 g | Freq: Once | INTRAVENOUS | Status: AC
  Administered 2023-02-23: 2 g via INTRAVENOUS
  Filled 2023-02-23: qty 50

## 2023-02-23 MED ORDER — SODIUM CHLORIDE 0.9% FLUSH
3.0000 mL | Freq: Two times a day (BID) | INTRAVENOUS | Status: DC
  Administered 2023-02-23 – 2023-02-26 (×6): 3 mL via INTRAVENOUS

## 2023-02-23 MED ORDER — METHYLPREDNISOLONE SODIUM SUCC 125 MG IJ SOLR
125.0000 mg | Freq: Two times a day (BID) | INTRAMUSCULAR | Status: DC
  Administered 2023-02-24: 125 mg via INTRAVENOUS
  Filled 2023-02-23: qty 2

## 2023-02-23 MED ORDER — ROSUVASTATIN CALCIUM 20 MG PO TABS
20.0000 mg | ORAL_TABLET | Freq: Every day | ORAL | Status: DC
  Administered 2023-02-24 – 2023-02-26 (×3): 20 mg via ORAL
  Filled 2023-02-23 (×3): qty 1

## 2023-02-23 NOTE — ED Notes (Signed)
Pt able to tolerate drinking soda and eating

## 2023-02-23 NOTE — ED Triage Notes (Signed)
BIBA from home for chest wall pain, SHOB 3 days. Pt has a hx of COPD, on home O2 @ 2lpm, left partial lobectomy. 1 Duoneb , 125 Solu Medrol PTA. 88% 2 lpm original 90HR 25 rr 20g l forearm 116/74 BP

## 2023-02-23 NOTE — ED Provider Notes (Signed)
[place] EMERGENCY DEPARTMENT AT [facility] Provider Note   CSN: [id number] Arrival date & time: 02/23/23  1526     History  Chief Complaint  Patient presents with   Shortness of Breath    Tracy Park is a 67 y.o. female history of COPD, A-fib on Eliquis, here presenting with shortness of breath and chest pain.  Patient states that she has been feeling short of breath for the last 3 days.  Also has some chest pain when she coughs.  Patient with noted to be hypoxic 88% on her baseline 2 L.  Patient was given Solu-Medrol and 1 DuoNeb by EMS.  Patient has a history of COPD.  She was seen at [facility] 2 weeks ago for similar symptoms and finished a course of steroids.  Patient also has seen her cardiologist and pulmonologist over the last week and no medication changes were made.  Patient states that she smokes cigarettes and last few days she was too short of breath to smoke  The history is provided by the patient.       Home Medications Prior to Admission medications   Medication Sig Start Date End Date Taking? Authorizing Provider  albuterol (PROVENTIL) (2.5 MG/3ML) 0.083% nebulizer solution Take 3 mLs (2.5 mg total) by nebulization every 6 (six) hours as needed for wheezing or shortness of breath. 01/30/23   Tilden Fossa, MD  albuterol (VENTOLIN HFA) 108 (90 Base) MCG/ACT inhaler Inhale 2 puffs into the lungs every 6 (six) hours as needed for wheezing or shortness of breath. 01/13/23   Burnadette Pop, MD  apixaban (ELIQUIS) 5 MG TABS tablet Take 1 tablet (5 mg total) by mouth 2 (two) times daily. 01/28/23 02/27/23  Jerald Kief, MD  azithromycin (ZITHROMAX) 250 MG tablet 1 tab po daily x 3 more days 01/29/23   Jerald Kief, MD  Budeson-Glycopyrrol-Formoterol (BREZTRI AEROSPHERE) 160-9-4.8 MCG/ACT AERO Inhale 1 puff into the lungs in the morning and at bedtime.    [provider]  levothyroxine (SYNTHROID) 125 MCG tablet Take 125 mcg by mouth daily before  breakfast.    [provider]  Magnesium Oxide 400 MG CAPS Take 1 capsule (400 mg total) by mouth daily at 12 noon. 03/01/22   Albertine Grates, MD  metoprolol succinate (TOPROL-XL) 25 MG 24 hr tablet Take 0.5 tablets (12.5 mg total) by mouth daily. 01/29/23 02/28/23  Jerald Kief, MD  nitroGLYCERIN (NITROSTAT) 0.4 MG SL tablet Place 0.4 mg under the tongue every 5 (five) minutes as needed for chest pain.    [provider]  OXYGEN Inhale 2 L into the lungs daily as needed (for low oxygen).    [provider]  pantoprazole (PROTONIX) 40 MG tablet Take 40 mg by mouth daily.    [provider]  rosuvastatin (CRESTOR) 20 MG tablet Take 20 mg by mouth daily. 01/15/20   [provider]  tamsulosin (FLOMAX) 0.4 MG CAPS capsule Take 0.4 mg by mouth daily. 03/27/22   [provider]      Allergies    Red dye, Oxycontin [oxycodone hcl], Prednisone, Aspirin, Tape, and Wound dressing adhesive    Review of Systems   Review of Systems  Respiratory:  Positive for shortness of breath.   All other systems reviewed and are negative.   Physical Exam Updated Vital Signs BP 118/69   Pulse 95   Temp 98.1 F (36.7 C) (Oral)   Resp 18   SpO2 99%  Physical Exam Vitals  and nursing note reviewed.  Constitutional:      Comments: Tachypneic and chronically ill  HENT:     Head: Normocephalic.     Mouth/Throat:     Mouth: Mucous membranes are moist.  Eyes:     Extraocular Movements: Extraocular movements intact.     Pupils: Pupils are equal, round, and reactive to light.  Cardiovascular:     Rate and Rhythm: Normal rate and regular rhythm.  Pulmonary:     Comments: Tachypneic and mild diffuse wheezing.  No retractions. Abdominal:     General: Bowel sounds are normal.     Palpations: Abdomen is soft.  Musculoskeletal:        General: Normal range of motion.     Cervical back: Normal range of motion and neck supple.  Skin:    General: Skin is warm.      Capillary Refill: Capillary refill takes less than 2 seconds.  Neurological:     General: No focal deficit present.     Mental Status: She is oriented to person, place, and time.  Psychiatric:        Mood and Affect: Mood normal.        Behavior: Behavior normal.     ED Results / Procedures / Treatments   Labs (all labs ordered are listed, but only abnormal results are displayed) Labs Reviewed  CBC WITH DIFFERENTIAL/PLATELET  COMPREHENSIVE METABOLIC PANEL  BLOOD GAS, VENOUS  BRAIN NATRIURETIC PEPTIDE  TROPONIN I (HIGH SENSITIVITY)    EKG None  Radiology No results found.  Procedures Procedures    Medications Ordered in ED Medications  magnesium sulfate IVPB 2 g 50 mL (has no administration in time range)  albuterol (PROVENTIL) (2.5 MG/3ML) 0.083% nebulizer solution (has no administration in time range)  ipratropium (ATROVENT) nebulizer solution 1 mg (has no administration in time range)    ED Course/ Medical Decision Making/ A&P                             Medical Decision Making Tracy Park is a 67 y.o. female here presenting with shortness of breath.  Patient has a history of heart failure and COPD.  Likely CHF versus COPD exacerbation.  Patient has mild diffuse wheezing so we will give continuous neb.  Patient will receive Solu-Medrol ready.  Will also give magnesium. She is on Eliquis and have low suspicion for PE.  9 pm I reviewed patient's labs and independently interpreted imaging study.  Patient's white count is normal.  VBG is reassuring.  Chest x-ray possible pneumonia but she has no fever or leukocytosis so I held antibiotics.  Patient received continuous nebs and afterwards I tried to ambulate her but she became very short of breath after she ambulated.  She did not desat but she was wheezing very short of breath.  At this point we will admit for COPD exacerbation.  Problems Addressed: COPD exacerbation (HCC): acute illness or injury  Amount and/or  Complexity of Data Reviewed Labs: ordered. Decision-making details documented in ED Course. Radiology: ordered and independent interpretation performed. Decision-making details documented in ED Course. ECG/medicine tests: ordered and independent interpretation performed. Decision-making details documented in ED Course.  Risk Prescription drug management. Decision regarding hospitalization.    Final Clinical Impression(s) / ED Diagnoses Final diagnoses:  None    Rx / DC Orders ED Discharge Orders     None         Chaney Malling  Hsienta, MD 02/23/23 2306

## 2023-02-23 NOTE — ED Notes (Signed)
ED TO INPATIENT HANDOFF REPORT  Name/Age/Gender Tracy Park 67 y.o. female  Code Status Code Status History     Date Active Date Inactive Code Status Order ID Comments User Context   01/25/2023 2350 01/28/2023 1928 DNR [id number]  Anselm Jungling, DO Inpatient   01/08/2023 0235 01/13/2023 1928 DNR [id number]  Hillary Bow, DO Inpatient   01/08/2023 0117 01/08/2023 0235 Full Code [id number]  Hillary Bow, DO ED   03/24/2022 1159 03/28/2022 1546 Full Code [id number]  Bobette Mo, MD ED   03/14/2022 2132 03/16/2022 1602 Full Code [id number]  Karl Ito, MD Inpatient   03/14/2022 0331 03/14/2022 2132 DNR [id number]  Tomma Lightning, MD ED   03/13/2022 2149 03/14/2022 0331 DNR [id number]  Mansy, Vernetta Honey, MD ED   02/26/2022 0200 03/01/2022 1555 DNR [id number]  Anselm Jungling, DO ED   08/16/2021 2316 08/25/2021 2324 DNR [id number]  John Giovanni, MD ED   08/16/2021 2253 08/16/2021 2316 Full Code [id number]  John Giovanni, MD ED   05/03/2021 0052 05/05/2021 1756 Full Code [id number]  Eduard Clos, MD ED   04/10/2020 0342 04/12/2020 2137 Full Code [id number]  Eduard Clos, MD ED   04/28/2012 0926 04/28/2012 2047 Full Code [id number]  Kelby Aline, RN Inpatient    Questions for Most Recent Historical Code Status (Order [id number])     Question Answer   If patient has no pulse and is not breathing Do Not Attempt Resuscitation   If patient has a pulse and/or is breathing: Medical Treatment Goals LIMITED ADDITIONAL INTERVENTIONS: Use medication/IV fluids and cardiac monitoring as indicated; Do not use intubation or mechanical ventilation (DNI), also provide comfort medications.  Transfer to Progressive/Stepdown as indicated, avoid Intensive Care.   Consent: Discussion documented in EHR or advanced directives reviewed            Home/SNF/Other Home  Chief Complaint COPD exacerbation (HCC) [J44.1]  Level of Care/Admitting Diagnosis ED Disposition     ED Disposition  Admit    Condition  --   Comment  Hospital Area: [facility] [[id number]]  Level of Care: Telemetry [5]  Admit to tele based on following criteria: Monitor for Ischemic changes  May admit patient to [facility] or [facility] if equivalent level of care is available:: Yes  Covid Evaluation: Asymptomatic - no recent exposure (last 10 days) testing not required  Diagnosis: COPD exacerbation [facility]) [[id number]]  Admitting Physician: Briscoe Deutscher [[id number]]  Attending Physician: Briscoe Deutscher [[id number]]  Certification:: I certify this patient will need inpatient services for at least 2 midnights  Estimated Length of Stay: 3          Medical History Past Medical History:  Diagnosis Date   Anginal pain (HCC)    Anxiety    Bipolar disorder (HCC)    Cancer (HCC)    COPD (chronic obstructive pulmonary disease) (HCC)    Dyspnea    Family history of adverse reaction to anesthesia    History of kidney stones    Hydroureteronephrosis 08/16/2021   Hypothyroidism    Lung cancer (HCC)    Myocardial infarction (HCC)    PTSD (post-traumatic stress disorder)    Sleep apnea    Thyroid disease     Allergies Allergies  Allergen Reactions   Red Dye Hives and Itching   Oxycontin [Oxycodone Hcl] Other (See Comments)    Hallucinations    Prednisone Hives and Nausea And Vomiting   Aspirin Hives  Tape Rash    Prefers paper tape   Wound Dressing Adhesive Rash    IV Location/Drains/Wounds Patient Lines/Drains/Airways Status     Active Line/Drains/Airways     Name Placement date Placement time Site Days   Peripheral IV 02/23/23 20 G Anterior;Left Forearm 02/23/23  1554  Forearm  less than 1            Labs/Imaging Results for orders placed or performed during the hospital encounter of 02/23/23 (from the past 48 hour(s))  CBC with Differential     Status: Abnormal   Collection Time: 02/23/23  4:09 PM  Result Value Ref Range   WBC 7.7 4.0 - 10.5 K/uL   RBC 3.31 (L) 3.87 -  5.11 MIL/uL   Hemoglobin 9.7 (L) 12.0 - 15.0 g/dL   HCT 16.1 (L) 09.6 - 04.5 %   MCV 94.0 80.0 - 100.0 fL   MCH 29.3 26.0 - 34.0 pg   MCHC 31.2 30.0 - 36.0 g/dL   RDW 40.9 81.1 - 91.4 %   Platelets 235 150 - 400 K/uL   nRBC 0.0 0.0 - 0.2 %   Neutrophils Relative % 73 %   Neutro Abs 5.6 1.7 - 7.7 K/uL   Lymphocytes Relative 20 %   Lymphs Abs 1.6 0.7 - 4.0 K/uL   Monocytes Relative 5 %   Monocytes Absolute 0.4 0.1 - 1.0 K/uL   Eosinophils Relative 1 %   Eosinophils Absolute 0.1 0.0 - 0.5 K/uL   Basophils Relative 1 %   Basophils Absolute 0.0 0.0 - 0.1 K/uL   Immature Granulocytes 0 %   Abs Immature Granulocytes 0.02 0.00 - 0.07 K/uL    Comment: Performed at [facility], [address] [place], [place] [address]  Comprehensive metabolic panel     Status: Abnormal   Collection Time: 02/23/23  4:09 PM  Result Value Ref Range   Sodium 141 135 - 145 mmol/L   Potassium 3.9 3.5 - 5.1 mmol/L   Chloride 107 98 - 111 mmol/L   CO2 26 22 - 32 mmol/L   Glucose, Bld 150 (H) 70 - 99 mg/dL    Comment: Glucose reference range applies only to samples taken after fasting for at least 8 hours.   BUN 15 8 - 23 mg/dL   Creatinine, Ser 6.21 0.44 - 1.00 mg/dL   Calcium 8.6 (L) 8.9 - 10.3 mg/dL   Total Protein 6.5 6.5 - 8.1 g/dL   Albumin 3.4 (L) 3.5 - 5.0 g/dL   AST 18 15 - 41 U/L   ALT 11 0 - 44 U/L   Alkaline Phosphatase 68 38 - 126 U/L   Total Bilirubin 0.4 0.3 - 1.2 mg/dL   GFR, Estimated >30 >86 mL/min    Comment: (NOTE) Calculated using the CKD-EPI Creatinine Equation (2021)    Anion gap 8 5 - 15    Comment: Performed at [facility], [address], [place], [place] [address]  Blood gas, venous (at [facility] and [facility])     Status: Abnormal   Collection Time: 02/23/23  4:09 PM  Result Value Ref Range   pH, Ven 7.38 7.25 - 7.43   pCO2, Ven 58 44 - 60 mmHg   pO2, Ven 31 (LL) 32 - 45 mmHg    Comment: CRITICAL RESULT CALLED TO, READ BACK BY AND VERIFIED  WITH: RN M Tonjia Parillo AT 1622 02/23/23 CRUICKSHANK A    Bicarbonate 34.3 (H) 20.0 - 28.0 mmol/L   Acid-Base Excess  7.3 (H) 0.0 - 2.0 mmol/L   O2 Saturation 60 %   Patient temperature 37.0     Comment: Performed at [facility], [address], [place], [place] [address]  Troponin I (High Sensitivity)     Status: None   Collection Time: 02/23/23  4:09 PM  Result Value Ref Range   Troponin I (High Sensitivity) 5 <18 ng/L    Comment: (NOTE) Elevated high sensitivity troponin I (hsTnI) values and significant  changes across serial measurements may suggest ACS but many other  chronic and acute conditions are known to elevate hsTnI results.  Refer to the "Links" section for chest pain algorithms and additional  guidance. Performed at [facility], [address] [place], [place] [address]   Brain natriuretic peptide     Status: None   Collection Time: 02/23/23  4:09 PM  Result Value Ref Range   B Natriuretic Peptide 76.3 0.0 - 100.0 pg/mL    Comment: Performed at [facility], [address], [place], [place] [address]   DG Chest Port 1 View  Result Date: 02/23/2023 CLINICAL DATA:  Chest wall pain EXAM: PORTABLE CHEST 1 VIEW COMPARISON:  Chest radiograph 01/29/2023 FINDINGS: Stable cardiac and mediastinal contours. Stable postsurgical changes left upper hemithorax. New patchy consolidative opacities left lower lung. The right lung is clear. No definite pneumothorax. Osseous structures unremarkable. IMPRESSION: New patchy consolidative opacities left lower lung may represent atelectasis or infection. Electronically Signed   By: Annia Belt M.D.   On: 02/23/2023 16:21    Pending Labs Unresulted Labs (From admission, onward)    None       Vitals/Pain Today's Vitals   02/23/23 1900 02/23/23 1930 02/23/23 1941 02/23/23 2000  BP: 102/60 100/62  111/67  Pulse: 88 87  90  Resp: (!) 21 (!) 21  (!) 22  Temp:   98.1 F (36.7 C)    TempSrc:   Oral   SpO2: 99% 99%  97%  PainSc:        Isolation Precautions No active isolations  Medications Medications  magnesium sulfate IVPB 2 g 50 mL (0 g Intravenous Stopped 02/23/23 1731)  albuterol (PROVENTIL) (2.5 MG/3ML) 0.083% nebulizer solution (15 mg/hr Nebulization Given 02/23/23 1622)  ipratropium (ATROVENT) nebulizer solution 1 mg (1 mg Nebulization Given 02/23/23 1622)  LORazepam (ATIVAN) tablet 1 mg (1 mg Oral Given 02/23/23 1700)    Mobility walks with person assist

## 2023-02-23 NOTE — H&P (Signed)
History and Physical    Tracy Park:[id number] DOB: [birth year] DOA: 02/23/2023  PCP: Jacquelin Hawking, PA-C   Patient coming from: Home   Chief Complaint: SOB   HPI: Tracy Park is a 67 y.o. female with medical history significant for hypertension, COPD, chronic hypoxic respiratory failure, PAF on Eliquis, and hypothyroidism who presents with shortness of breath.  Patient reports developing shortness of breath and productive cough on 02/20/2023.  Symptoms have progressively worsened since then to the point where she is short of breath even at rest today.  She has some chest discomfort associated with this that she attributes to her coughing and increased work of breathing.  She denies leg swelling, fever, or chills.  ED Course: Upon arrival to the ED, patient is found to be afebrile and saturating well on 2 L/min of supplemental oxygen with mild tachypnea, normal heart rate, and stable blood pressure.  BNP and troponin are normal.  Chest x-ray demonstrates left lower lung atelectasis versus infection.  Patient was given a continuous albuterol treatment, Atrovent, Ativan, and IV magnesium in the ED.  Review of Systems:  All other systems reviewed and apart from HPI, are negative.  Past Medical History:  Diagnosis Date   Anginal pain (HCC)    Anxiety    Bipolar disorder (HCC)    Cancer (HCC)    COPD (chronic obstructive pulmonary disease) (HCC)    Dyspnea    Family history of adverse reaction to anesthesia    History of kidney stones    Hydroureteronephrosis 08/16/2021   Hypothyroidism    Lung cancer (HCC)    Myocardial infarction (HCC)    PTSD (post-traumatic stress disorder)    Sleep apnea    Thyroid disease     Past Surgical History:  Procedure Laterality Date   ABDOMINAL HYSTERECTOMY     BACK SURGERY     CYSTOSCOPY W/ URETERAL STENT PLACEMENT Right 05/03/2021   Procedure: CYSTOSCOPY WITH RETROGRADE PYELOGRAM/URETERAL STENT PLACEMENT;  Surgeon: Crist Fat, MD;  Location: [facility]S;  Service: Urology;  Laterality: Right;   EYE SURGERY     kidney stent     thyroidectomy      Social History:   reports that she has been smoking cigarettes. She has been smoking an average of .15 packs per day. She has never used smokeless tobacco. She reports that she does not currently use alcohol. She reports that she does not currently use drugs.  Allergies  Allergen Reactions   Red Dye Hives and Itching   Oxycontin [Oxycodone Hcl] Other (See Comments)    Hallucinations    Prednisone Hives and Nausea And Vomiting   Aspirin Hives   Tape Rash    Prefers paper tape   Wound Dressing Adhesive Rash    Family History  Family history unknown: Yes     Prior to Admission medications   Medication Sig Start Date End Date Taking? Authorizing Provider  albuterol (PROVENTIL) (2.5 MG/3ML) 0.083% nebulizer solution Take 3 mLs (2.5 mg total) by nebulization every 6 (six) hours as needed for wheezing or shortness of breath. 01/30/23   Tilden Fossa, MD  albuterol (VENTOLIN HFA) 108 (90 Base) MCG/ACT inhaler Inhale 2 puffs into the lungs every 6 (six) hours as needed for wheezing or shortness of breath. 01/13/23   Burnadette Pop, MD  apixaban (ELIQUIS) 5 MG TABS tablet Take 1 tablet (5 mg total) by mouth 2 (two) times daily. 01/28/23 02/27/23  Jerald Kief, MD  azithromycin [facility])  250 MG tablet 1 tab po daily x 3 more days 01/29/23   Jerald Kief, MD  Budeson-Glycopyrrol-Formoterol (BREZTRI AEROSPHERE) 160-9-4.8 MCG/ACT AERO Inhale 1 puff into the lungs in the morning and at bedtime.    [provider]  levothyroxine (SYNTHROID) 125 MCG tablet Take 125 mcg by mouth daily before breakfast.    [provider]  Magnesium Oxide 400 MG CAPS Take 1 capsule (400 mg total) by mouth daily at 12 noon. 03/01/22   Albertine Grates, MD  metoprolol succinate (TOPROL-XL) 25 MG 24 hr tablet Take 0.5 tablets (12.5 mg total) by mouth daily. 01/29/23 02/28/23  Jerald Kief, MD  nitroGLYCERIN (NITROSTAT) 0.4 MG SL tablet Place 0.4 mg under the tongue every 5 (five) minutes as needed for chest pain.    [provider]  OXYGEN Inhale 2 L into the lungs daily as needed (for low oxygen).    [provider]  pantoprazole (PROTONIX) 40 MG tablet Take 40 mg by mouth daily.    [provider]  rosuvastatin (CRESTOR) 20 MG tablet Take 20 mg by mouth daily. 01/15/20   [provider]  tamsulosin (FLOMAX) 0.4 MG CAPS capsule Take 0.4 mg by mouth daily. 03/27/22   [provider]    Physical Exam: Vitals:   02/23/23 1900 02/23/23 1930 02/23/23 1941 02/23/23 2000  BP: 102/60 100/62  111/67  Pulse: 88 87  90  Resp: (!) 21 (!) 21  (!) 22  Temp:   98.1 F (36.7 C)   TempSrc:   Oral   SpO2: 99% 99%  97%     Constitutional: NAD, no pallor or diaphoresis   Eyes: PERTLA, lids and conjunctivae normal ENMT: Mucous membranes are moist. Posterior pharynx clear of any exudate or lesions.   Neck: supple, no masses  Respiratory: Markedly diminished breath sounds bilaterally, prolonged expiratory phase. Dyspneic with speech.  Cardiovascular: S1 & S2 heard, regular rate and rhythm. No extremity edema.   Abdomen: No distension, no tenderness, soft. Bowel sounds active.  Musculoskeletal: no clubbing / cyanosis. No joint deformity upper and lower extremities.   Skin: no significant rashes, lesions, ulcers. Warm, dry, well-perfused. Neurologic: CN 2-12 grossly intact. Moving all extremities. Alert and oriented.  Psychiatric: Pleasant. Cooperative.    Labs and Imaging on Admission: I have personally reviewed following labs and imaging studies  CBC: Recent Labs  Lab 02/23/23 1609  WBC 7.7  NEUTROABS 5.6  HGB 9.7*  HCT 31.1*  MCV 94.0  PLT 235   Basic Metabolic Panel: Recent Labs  Lab 02/23/23 1609  NA 141  K 3.9  CL 107  CO2 26  GLUCOSE 150*  BUN 15  CREATININE 0.52  CALCIUM 8.6*   GFR: CrCl cannot be  calculated (Unknown ideal weight.). Liver Function Tests: Recent Labs  Lab 02/23/23 1609  AST 18  ALT 11  ALKPHOS 68  BILITOT 0.4  PROT 6.5  ALBUMIN 3.4*   No results for input(s): "LIPASE", "AMYLASE" in the last 168 hours. No results for input(s): "AMMONIA" in the last 168 hours. Coagulation Profile: No results for input(s): "INR", "PROTIME" in the last 168 hours. Cardiac Enzymes: No results for input(s): "CKTOTAL", "CKMB", "CKMBINDEX", "TROPONINI" in the last 168 hours. BNP (last 3 results) No results for input(s): "PROBNP" in the last 8760 hours. HbA1C: No results for input(s): "HGBA1C" in the last 72 hours. CBG: No results for input(s): "GLUCAP" in the last 168 hours. Lipid Profile: No results for input(s): "CHOL", "HDL", "LDLCALC", "TRIG", "  CHOLHDL", "LDLDIRECT" in the last 72 hours. Thyroid Function Tests: No results for input(s): "TSH", "T4TOTAL", "FREET4", "T3FREE", "THYROIDAB" in the last 72 hours. Anemia Panel: No results for input(s): "VITAMINB12", "FOLATE", "FERRITIN", "TIBC", "IRON", "RETICCTPCT" in the last 72 hours. Urine analysis:    Component Value Date/Time   COLORURINE STRAW (A) 03/11/2022 1743   APPEARANCEUR CLEAR 03/11/2022 1743   LABSPEC 1.006 03/11/2022 1743   PHURINE 8.0 03/11/2022 1743   GLUCOSEU NEGATIVE 03/11/2022 1743   HGBUR NEGATIVE 03/11/2022 1743   BILIRUBINUR NEGATIVE 03/11/2022 1743   KETONESUR NEGATIVE 03/11/2022 1743   PROTEINUR NEGATIVE 03/11/2022 1743   UROBILINOGEN 0.2 11/06/2009 1559   NITRITE NEGATIVE 03/11/2022 1743   LEUKOCYTESUR NEGATIVE 03/11/2022 1743   Sepsis Labs: @LABRCNTIP (procalcitonin:4,lacticidven:4) )No results found for this or any previous visit (from the past 240 hour(s)).   Radiological Exams on Admission: DG Chest Port 1 View  Result Date: 02/23/2023 CLINICAL DATA:  Chest wall pain EXAM: PORTABLE CHEST 1 VIEW COMPARISON:  Chest radiograph 01/29/2023 FINDINGS: Stable cardiac and mediastinal contours. Stable  postsurgical changes left upper hemithorax. New patchy consolidative opacities left lower lung. The right lung is clear. No definite pneumothorax. Osseous structures unremarkable. IMPRESSION: New patchy consolidative opacities left lower lung may represent atelectasis or infection. Electronically Signed   By: Annia Belt M.D.   On: 02/23/2023 16:21    EKG: Independently reviewed. Sinus rhythm, LVH.   Assessment/Plan   1. COPD exacerbation; ?pneumonia; chronic hypoxic respiratory failure  - Culture sputum, check/trend procalcitonin, continue systemic steroids, start antibiotics, schedule DuoNebs, use additional albuterol as needed, and continue supplemental O2    2. PAF  - Continue metoprolol and Eliquis    3. Hx of lung cancer; lung nodule  - Continue follow-up with pulmonologist at [facility] as planned    4. Hypothyroidism  - Synthroid    5. CAD  - She has chest discomfort that seems to be d/t increase WOB and cough rather than angina  - Troponin is normal and no acute ischemic changes on EKG  - Continue Crestor, Toprol, and Eliquis    DVT prophylaxis: Eliquis  Code Status: DNR  Level of Care: Level of care: Telemetry Family Communication: none present  Disposition Plan:  Patient is from: Home  Anticipated d/c is to: TBD Anticipated d/c date is: 02/26/23  Patient currently: Pending improved respiratory status Consults called: None  Admission status: Inpatient     Briscoe Deutscher, MD [facility]  02/23/2023, 8:46 PM

## 2023-02-23 NOTE — ED Notes (Addendum)
Ambulated Pt with Spo2, Pt stated they felt jittery and usually feels unstable when walking. Pt did okay but stated they began feeling a little light headed. Pt also began to become weak towards the end of the walk and her legs began to buckle. But the stats stayed between 89-96% for the Spo2 and HR it started off at 96 and stay between 96-107.

## 2023-02-24 DIAGNOSIS — J441 Chronic obstructive pulmonary disease with (acute) exacerbation: Secondary | ICD-10-CM | POA: Diagnosis not present

## 2023-02-24 LAB — CBC
HCT: 34.8 % — ABNORMAL LOW (ref 36.0–46.0)
Hemoglobin: 10.7 g/dL — ABNORMAL LOW (ref 12.0–15.0)
MCH: 28.6 pg (ref 26.0–34.0)
MCHC: 30.7 g/dL (ref 30.0–36.0)
MCV: 93 fL (ref 80.0–100.0)
Platelets: 258 10*3/uL (ref 150–400)
RBC: 3.74 MIL/uL — ABNORMAL LOW (ref 3.87–5.11)
RDW: 13.2 % (ref 11.5–15.5)
WBC: 5.5 10*3/uL (ref 4.0–10.5)
nRBC: 0 % (ref 0.0–0.2)

## 2023-02-24 LAB — PROCALCITONIN
Procalcitonin: <0.1 ng/mL
Procalcitonin: <0.1 ng/mL

## 2023-02-24 LAB — BASIC METABOLIC PANEL
Anion gap: 8 (ref 5–15)
BUN: 19 mg/dL (ref 8–23)
CO2: 28 mmol/L (ref 22–32)
Calcium: 9.1 mg/dL (ref 8.9–10.3)
Chloride: 103 mmol/L (ref 98–111)
Creatinine, Ser: 0.52 mg/dL (ref 0.44–1.00)
GFR, Estimated: >60 mL/min (ref >60)
Glucose, Bld: 98 mg/dL (ref 70–99)
Potassium: 4.1 mmol/L (ref 3.5–5.1)
Sodium: 139 mmol/L (ref 135–145)

## 2023-02-24 MED ORDER — METHYLPREDNISOLONE SODIUM SUCC 125 MG IJ SOLR
125.0000 mg | Freq: Two times a day (BID) | INTRAMUSCULAR | Status: DC
  Administered 2023-02-24 – 2023-02-25 (×2): 125 mg via INTRAVENOUS
  Filled 2023-02-24 (×2): qty 2

## 2023-02-24 MED ORDER — METHOCARBAMOL 500 MG PO TABS
500.0000 mg | ORAL_TABLET | Freq: Four times a day (QID) | ORAL | Status: DC | PRN
  Filled 2023-02-24: qty 1

## 2023-02-24 MED ORDER — ENSURE ENLIVE PO LIQD
237.0000 mL | Freq: Two times a day (BID) | ORAL | Status: DC
  Administered 2023-02-24 – 2023-02-26 (×4): 237 mL via ORAL

## 2023-02-24 MED ORDER — IPRATROPIUM-ALBUTEROL 0.5-2.5 (3) MG/3ML IN SOLN
3.0000 mL | Freq: Two times a day (BID) | RESPIRATORY_TRACT | Status: DC
  Administered 2023-02-24 – 2023-02-26 (×4): 3 mL via RESPIRATORY_TRACT
  Filled 2023-02-24 (×4): qty 3

## 2023-02-24 MED ORDER — ALPRAZOLAM 0.5 MG PO TABS
0.5000 mg | ORAL_TABLET | Freq: Three times a day (TID) | ORAL | Status: DC | PRN
  Administered 2023-02-24 – 2023-02-26 (×4): 0.5 mg via ORAL
  Filled 2023-02-24 (×4): qty 1

## 2023-02-24 MED ORDER — MOMETASONE FURO-FORMOTEROL FUM 200-5 MCG/ACT IN AERO
2.0000 | INHALATION_SPRAY | Freq: Two times a day (BID) | RESPIRATORY_TRACT | Status: DC
  Administered 2023-02-24 – 2023-02-26 (×5): 2 via RESPIRATORY_TRACT
  Filled 2023-02-24: qty 8.8

## 2023-02-24 NOTE — Progress Notes (Signed)
Mobility Specialist - Progress Note    02/24/23 1428  Mobility  Activity Ambulated independently in hallway  Level of Assistance Standby assist, set-up cues, supervision of patient - no hands on  Assistive Device None  Distance Ambulated (ft) 250 ft  Range of Motion/Exercises Active  Activity Response Tolerated fair  Mobility Referral Yes  $Mobility charge 1 Mobility  Mobility Specialist Start Time (ACUTE ONLY) 1418  Mobility Specialist Stop Time (ACUTE ONLY) 1427  Mobility Specialist Time Calculation (min) (ACUTE ONLY) 9 min   Pt was found in bed and agreeable to ambulate. Stated feeling SOB during ambulation SPO2 was >92% through session. At EOS stated feeling out of breath. Was left in bed with all needs met. Call bell in reach.  Billey Chang Mobility Specialist

## 2023-02-24 NOTE — Progress Notes (Signed)
PROGRESS NOTE    Tracy Park  ZOX:[id number] DOB: 07-Aug-1956 DOA: 02/23/2023 PCP: Jacquelin Hawking, PA-C   Brief Narrative:   67 y.o. female with medical history significant for hypertension, COPD, chronic hypoxic respiratory failure, PAF on Eliquis, hypothyroidism, multiple recent hospitalizations for COPD exacerbation, most recent hospitalization from 01/25/2023-01/28/2023 for new onset A-fib with RVR for which she was started on Eliquis and Toprol-XL as per cardiology recommendations.  She presented with worsening shortness of breath.  On presentation, she was saturating well on 2 L oxygen by nasal cannula with mild tachypnea.  Chest x-ray showed left lower lung atelectasis versus infection.  She was started on IV Solu-Medrol and antibiotics along with nebs.  Assessment & Plan:   COPD exacerbation Possible left lower lobe bacterial pneumonia Chronic respiratory failure with hypoxia -Normally uses 2 L oxygen by nasal cannula.  Currently on the same.  Chest x-ray as above.  Procalcitonin less than 0.1. -Currently on IV Solu-Medrol, IV antibiotics along with nebs and inhalers.  Outpatient follow-up with pulmonary.  Paroxysmal A-fib -Currently rate controlled.  Continue metoprolol succinate and apixaban  Hypothyroidism -Continue levothyroxine  GERD -Continue PPI  Hyperlipidemia -Continue statin  History of CAD -Currently stable.  Continue Crestor, Toprol and Eliquis.  Outpatient follow-up with cardiology  History of lung cancer; lung nodule -Continue follow-up with pulmonologist at [facility] as planned.  Anemia of chronic disease -From chronic illnesses.  Hemoglobin currently stable.  Monitor intermittently  DVT prophylaxis: Eliquis  code Status: DNR Family Communication: None at bedside Disposition Plan: Status is: Inpatient Remains inpatient appropriate because: Of severity of illness.  Need for IV steroids and antibiotics.   Consultants: None  Procedures:  None  Antimicrobials: Rocephin and Zithromax from 02/23/2023 onwards   Subjective: Patient seen and examined at bedside.  Still complains of shortness of breath and cough.  Does not feel ready to go home yet.  No fever, vomiting, abdominal pain reported.  Complains of leg pain.  Objective: Vitals:   02/23/23 2157 02/24/23 0214 02/24/23 0627 02/24/23 0741  BP:  99/66 104/65   Pulse:  72 82   Resp:  18 18   Temp:  98.1 F (36.7 C) 97.8 F (36.6 C)   TempSrc:  Oral Oral   SpO2: 99% 100% 100% 99%    Intake/Output Summary (Last 24 hours) at 02/24/2023 8119 Last data filed at 02/23/2023 1731 Gross per 24 hour  Intake 50 ml  Output --  Net 50 ml   There were no vitals filed for this visit.  Examination:  General exam: Appears calm and comfortable.  Looks chronically ill and deconditioned.  On 2 L oxygen via nasal cannula. Respiratory system: Bilateral decreased breath sounds at bases with scattered crackles Cardiovascular system: S1 & S2 heard, Rate controlled Gastrointestinal system: Abdomen is nondistended, soft and nontender. Normal bowel sounds heard. Extremities: No cyanosis, clubbing, edema  Central nervous system: Alert and oriented. No focal neurological deficits. Moving extremities Skin: No rashes, lesions or ulcers Psychiatry: Flat affect.  Not agitated.    Data Reviewed: I have personally reviewed following labs and imaging studies  CBC: Recent Labs  Lab 02/23/23 1609 02/24/23 0346  WBC 7.7 5.5  NEUTROABS 5.6  --   HGB 9.7* 10.7*  HCT 31.1* 34.8*  MCV 94.0 93.0  PLT 235 258   Basic Metabolic Panel: Recent Labs  Lab 02/23/23 1609 02/24/23 0346  NA 141 139  K 3.9 4.1  CL 107 103  CO2 26 28  GLUCOSE 150*  98  BUN 15 19  CREATININE 0.52 0.52  CALCIUM 8.6* 9.1   GFR: CrCl cannot be calculated (Unknown ideal weight.). Liver Function Tests: Recent Labs  Lab 02/23/23 1609  AST 18  ALT 11  ALKPHOS 68  BILITOT 0.4  PROT 6.5  ALBUMIN 3.4*   No  results for input(s): "LIPASE", "AMYLASE" in the last 168 hours. No results for input(s): "AMMONIA" in the last 168 hours. Coagulation Profile: No results for input(s): "INR", "PROTIME" in the last 168 hours. Cardiac Enzymes: No results for input(s): "CKTOTAL", "CKMB", "CKMBINDEX", "TROPONINI" in the last 168 hours. BNP (last 3 results) No results for input(s): "PROBNP" in the last 8760 hours. HbA1C: No results for input(s): "HGBA1C" in the last 72 hours. CBG: No results for input(s): "GLUCAP" in the last 168 hours. Lipid Profile: No results for input(s): "CHOL", "HDL", "LDLCALC", "TRIG", "CHOLHDL", "LDLDIRECT" in the last 72 hours. Thyroid Function Tests: No results for input(s): "TSH", "T4TOTAL", "FREET4", "T3FREE", "THYROIDAB" in the last 72 hours. Anemia Panel: No results for input(s): "VITAMINB12", "FOLATE", "FERRITIN", "TIBC", "IRON", "RETICCTPCT" in the last 72 hours. Sepsis Labs: Recent Labs  Lab 02/23/23 2213 02/24/23 0346  PROCALCITON <0.10 <0.10    No results found for this or any previous visit (from the past 240 hour(s)).       Radiology Studies: DG Chest Port 1 View  Result Date: 02/23/2023 CLINICAL DATA:  Chest wall pain EXAM: PORTABLE CHEST 1 VIEW COMPARISON:  Chest radiograph 01/29/2023 FINDINGS: Stable cardiac and mediastinal contours. Stable postsurgical changes left upper hemithorax. New patchy consolidative opacities left lower lung. The right lung is clear. No definite pneumothorax. Osseous structures unremarkable. IMPRESSION: New patchy consolidative opacities left lower lung may represent atelectasis or infection. Electronically Signed   By: Annia Belt M.D.   On: 02/23/2023 16:21        Scheduled Meds:  apixaban  5 mg Oral BID   azithromycin  500 mg Oral Daily   ipratropium-albuterol  3 mL Nebulization BID   levothyroxine  125 mcg Oral Q0600   methylPREDNISolone (SOLU-MEDROL) injection  125 mg Intravenous Q12H   Followed by   Melene Muller ON  02/25/2023] predniSONE  40 mg Oral Q breakfast   metoprolol succinate  12.5 mg Oral Daily   mometasone-formoterol  2 puff Inhalation BID   pantoprazole  40 mg Oral Daily   rosuvastatin  20 mg Oral Daily   sodium chloride flush  3 mL Intravenous Q12H   tamsulosin  0.4 mg Oral Daily   Continuous Infusions:  cefTRIAXone (ROCEPHIN)  IV 1 g (02/23/23 2208)          Glade Lloyd, MD [facility] 02/24/2023, 8:12 AM

## 2023-02-24 NOTE — Progress Notes (Addendum)
Chaplain engaged in an initial visit with Summar. Chaplain built rapport and offered space for Prestyn to share about her life and healthcare journey. Aryana voiced that the panic attacks are fairly recent in her life. Chaplain worked to assess what could be contributing to the additional stress in her life and on her body. She lives with her daughter and her boyfriend in a one bedroom home and sleeps on the couch. That has been hard on her in losing her independence and privacy. Wrenly also voiced that it has been a transition in needing oxygen all the time. Chaplain assessed that Byrl has been through some significant changes in her life that have not only impacted her physically, but emotionally and mentally as well.   Chaplain assesses that speaking with Social Work may be beneficial for Sherrilynn as she thinks about her living arrangements long term and her need for additional support.   Chaplain will follow-up tomorrow for continuing conversations around her life and needs, as well as offering spiritual support through prayer and community.  Hipolito Bayley, MDiv  02/24/23 1700  Spiritual Encounters  Type of Visit Initial  Care provided to: Patient  Reason for visit Routine spiritual support

## 2023-02-24 NOTE — TOC CM/SW Note (Addendum)
Transition of Care [facility]) - Inpatient Brief Assessment  Patient Details  Name: Tracy Park MRN: [id number] Date of Birth: 11/22/1955  [facility] of [facility] CM/SW Contact:    Ewing Schlein, LCSW Phone Number: 02/24/2023, 2:59 PM  Clinical Narrative: Patient resides at home with her daughter. Patient is on 2L/min home oxygen, which she receives through [employer]. Per chart review, patient has several travel tanks at home. Patient is also generally able to get transportation at discharge.  Addendum: TOC consulted as patient has concerns about her daughter possibly not continuing to allow her to reside in the home. CSW spoke with patient and explained that TOC does not assist with getting patients on housing lists and patient's daughter has not actually prevented her from returning home at this time.  Patient aware that [facility] can offer a shelter list if patient is not allowed to return home, but patient would likely need to go to the [facility] as shelter beds are difficult to access. Patient reported she does not "have much privacy" at her daughter's home. CSW explained TOC cannot address that issue.  Transition of Care Asessment: Insurance and Status: Insurance coverage has been reviewed Patient has primary care physician: Yes Home environment has been reviewed: Resides with daughter Prior level of function:: Independent at baseline Prior/Current Home Services: Current home services (On 2L/min home oxygen at baseline through [employer]) Social Determinants of Health Reivew: SDOH reviewed no interventions necessary Readmission risk has been reviewed: Yes Transition of care needs: no transition of care needs at this time

## 2023-02-25 DIAGNOSIS — J441 Chronic obstructive pulmonary disease with (acute) exacerbation: Secondary | ICD-10-CM | POA: Diagnosis not present

## 2023-02-25 DIAGNOSIS — J9611 Chronic respiratory failure with hypoxia: Secondary | ICD-10-CM | POA: Diagnosis not present

## 2023-02-25 MED ORDER — METHYLPREDNISOLONE SODIUM SUCC 125 MG IJ SOLR
80.0000 mg | Freq: Two times a day (BID) | INTRAMUSCULAR | Status: DC
  Administered 2023-02-25 – 2023-02-26 (×2): 80 mg via INTRAVENOUS
  Filled 2023-02-25 (×2): qty 2

## 2023-02-25 MED ORDER — ORAL CARE MOUTH RINSE: 15.0000 mL | OROMUCOSAL | Status: DC | PRN

## 2023-02-25 NOTE — Progress Notes (Signed)
PROGRESS NOTE    Tracy Park  WUJ:[id number] DOB: 14-Jul-1956 DOA: 02/23/2023 PCP: Jacquelin Hawking, PA-C   Brief Narrative:   67 y.o. female with medical history significant for hypertension, COPD, chronic hypoxic respiratory failure, PAF on Eliquis, hypothyroidism, multiple recent hospitalizations for COPD exacerbation, most recent hospitalization from 01/25/2023-01/28/2023 for new onset A-fib with RVR for which she was started on Eliquis and Toprol-XL as per cardiology recommendations.  She presented with worsening shortness of breath.  On presentation, she was saturating well on 2 L oxygen by nasal cannula with mild tachypnea.  Chest x-ray showed left lower lung atelectasis versus infection.  She was started on IV Solu-Medrol and antibiotics along with nebs.  Assessment & Plan:   COPD exacerbation Possible left lower lobe bacterial pneumonia Chronic respiratory failure with hypoxia Ongoing tobacco use -Normally uses 2 L oxygen by nasal cannula.  Currently on the same.  Chest x-ray as above.  Procalcitonin less than 0.1. -Currently on IV Solu-Medrol: Change to 80 mg IV every 12 hours.  Also on IV antibiotics along with nebs and inhalers.  Outpatient follow-up with pulmonary. -Counseled regarding tobacco cessation  Paroxysmal A-fib -Currently rate controlled.  Continue metoprolol succinate and apixaban  Hypothyroidism -Continue levothyroxine  GERD -Continue PPI  Hyperlipidemia -Continue statin  History of CAD -Currently stable.  Continue Crestor, Toprol and Eliquis.  Outpatient follow-up with cardiology  History of lung cancer; lung nodule -Continue follow-up with pulmonologist at [facility] as planned.  Anemia of chronic disease -From chronic illnesses.  Hemoglobin currently stable.  Monitor intermittently  Anxiety -Complains of increasing anxiety recently.  Started on as needed Xanax.  DVT prophylaxis: Eliquis  code Status: DNR Family Communication: None at  bedside Disposition Plan: Status is: Inpatient Remains inpatient appropriate because: Of severity of illness.  Need for IV steroids and antibiotics.  Possible discharge in 1 to 2 days pending clinical improvement.   Consultants: None  Procedures: None  Antimicrobials: Rocephin and Zithromax from 02/23/2023 onwards   Subjective: Patient seen and examined at bedside.  No fever or vomiting reported.  Still complains of exertional shortness of breath and cough.  Does not feel ready to go home today. Objective: Vitals:   02/24/23 2007 02/24/23 2156 02/25/23 0500 02/25/23 0503  BP:  (!) 96/56 107/71   Pulse:  88 72   Resp:  16 18   Temp:  97.8 F (36.6 C) 98 F (36.7 C)   TempSrc:  Oral Oral   SpO2: 97% 100% 98%   Weight:    46.6 kg  Height:        Intake/Output Summary (Last 24 hours) at 02/25/2023 0803 Last data filed at 02/25/2023 0510 Gross per 24 hour  Intake 1548.94 ml  Output 600 ml  Net 948.94 ml    Filed Weights   02/24/23 0953 02/25/23 0503  Weight: 45.8 kg 46.6 kg    Examination:  General: On 2 L oxygen via nasal cannula.  No distress.  Looks chronically ill and deconditioned and very thinly built. ENT/neck: No thyromegaly.  JVD is not elevated  respiratory: Decreased breath sounds at bases bilaterally with some crackles; no wheezing  CVS: S1-S2 heard, rate controlled currently Abdominal: Soft, nontender, slightly distended; no organomegaly, bowel sounds are heard Extremities: Trace lower extremity edema; no cyanosis  CNS: Awake and alert.  No focal neurologic deficit.  Moves extremities Lymph: No obvious lymphadenopathy Skin: No obvious ecchymosis/lesions  psych: Looks anxious; no signs of agitation.   Musculoskeletal: No obvious joint swelling/deformity  Data Reviewed: I have personally reviewed following labs and imaging studies  CBC: Recent Labs  Lab 02/23/23 1609 02/24/23 0346  WBC 7.7 5.5  NEUTROABS 5.6  --   HGB 9.7* 10.7*  HCT 31.1*  34.8*  MCV 94.0 93.0  PLT 235 258    Basic Metabolic Panel: Recent Labs  Lab 02/23/23 1609 02/24/23 0346  NA 141 139  K 3.9 4.1  CL 107 103  CO2 26 28  GLUCOSE 150* 98  BUN 15 19  CREATININE 0.52 0.52  CALCIUM 8.6* 9.1    GFR: Estimated Creatinine Clearance: 52.3 mL/min (by C-G formula based on SCr of 0.52 mg/dL). Liver Function Tests: Recent Labs  Lab 02/23/23 1609  AST 18  ALT 11  ALKPHOS 68  BILITOT 0.4  PROT 6.5  ALBUMIN 3.4*    No results for input(s): "LIPASE", "AMYLASE" in the last 168 hours. No results for input(s): "AMMONIA" in the last 168 hours. Coagulation Profile: No results for input(s): "INR", "PROTIME" in the last 168 hours. Cardiac Enzymes: No results for input(s): "CKTOTAL", "CKMB", "CKMBINDEX", "TROPONINI" in the last 168 hours. BNP (last 3 results) No results for input(s): "PROBNP" in the last 8760 hours. HbA1C: No results for input(s): "HGBA1C" in the last 72 hours. CBG: No results for input(s): "GLUCAP" in the last 168 hours. Lipid Profile: No results for input(s): "CHOL", "HDL", "LDLCALC", "TRIG", "CHOLHDL", "LDLDIRECT" in the last 72 hours. Thyroid Function Tests: No results for input(s): "TSH", "T4TOTAL", "FREET4", "T3FREE", "THYROIDAB" in the last 72 hours. Anemia Panel: No results for input(s): "VITAMINB12", "FOLATE", "FERRITIN", "TIBC", "IRON", "RETICCTPCT" in the last 72 hours. Sepsis Labs: Recent Labs  Lab 02/23/23 2213 02/24/23 0346  PROCALCITON <0.10 <0.10     No results found for this or any previous visit (from the past 240 hour(s)).       Radiology Studies: DG Chest Port 1 View  Result Date: 02/23/2023 CLINICAL DATA:  Chest wall pain EXAM: PORTABLE CHEST 1 VIEW COMPARISON:  Chest radiograph 01/29/2023 FINDINGS: Stable cardiac and mediastinal contours. Stable postsurgical changes left upper hemithorax. New patchy consolidative opacities left lower lung. The right lung is clear. No definite pneumothorax. Osseous  structures unremarkable. IMPRESSION: New patchy consolidative opacities left lower lung may represent atelectasis or infection. Electronically Signed   By: Annia Belt M.D.   On: 02/23/2023 16:21        Scheduled Meds:  apixaban  5 mg Oral BID   feeding supplement  237 mL Oral BID BM   ipratropium-albuterol  3 mL Nebulization BID   levothyroxine  125 mcg Oral Q0600   methylPREDNISolone (SOLU-MEDROL) injection  125 mg Intravenous Q12H   metoprolol succinate  12.5 mg Oral Daily   mometasone-formoterol  2 puff Inhalation BID   pantoprazole  40 mg Oral Daily   rosuvastatin  20 mg Oral Daily   sodium chloride flush  3 mL Intravenous Q12H   tamsulosin  0.4 mg Oral Daily   Continuous Infusions:  cefTRIAXone (ROCEPHIN)  IV Stopped (02/24/23 2210)          Glade Lloyd, MD [facility] 02/25/2023, 8:03 AM

## 2023-02-25 NOTE — Progress Notes (Signed)
This RN went to see pt in room and was observed talking to primary RN. Pt appeared frustrated due to having a sitter and being put on suicide precautions. Pt stated, "I have these people watching me like I'm in prison because they think I'm going to commit suicide. When that chaplain came in, she took what I said about wanting to kill myself too far because I was talking about when my mom died when I was 89." This RN provided supportive and active listening while pt continued to express her frustrations with "everybody involved with taking all my things and watching me." This RN asked if there was anything that could be done to make her feel better in the meantime as the precautions cannot be removed at the current time. Pt stated that she didn't want to eat anything due to, "being watched", and "I'm just going to go on a starvation diet because 'why eat?'". With some convincing, pt let this RN order her dinner tray and expressed that this RN is the only person she trusted at this time. Pt was tearful and still didn't quite understand that primary RN and chaplain only wanted to ensure pt safety while here at the hospital and were following policy.

## 2023-02-25 NOTE — Progress Notes (Addendum)
Pt made several comments to chaplain about not wanting to be alive and to end it all. MD made aware. Psychiatry and palliative consulted.This RN asked suicide screening questions verbatim from chart and recorded answers. Pt was tearful during questions and after answering question 2 stated "this is gonna get me on suicide watch isn't it?". This RN explained that we are only trying to help keep her safe. Pt preceded to answer yes to question 3 as well. Pt scored a moderate risk. Suicide sitter called into room. Room prepped per suicide checklist.

## 2023-02-25 NOTE — Progress Notes (Signed)
Chaplain engaged in a follow-up visit with Tracy Park. Tracy Park was very tearful and expressed a desire to not continue on with living. Tracy Park talked about her fractured family and relationships, as well as having insecure housing. Chaplain provided reflective listening, support, and a compassionate presence. Through conversation, Chaplain reached out to nurse and case management.   Tracy Park had been talking about a desire to die and Chaplain assessed a need for Psych support. At one point, she voiced taking off her oxygen so that she could die. Chaplain recognized that Tracy Park spent a lot of time discussing how the people around her were harming her in some way or had abandoned her. Chaplain assessed that Tracy Park has had a number of hard situations happen back to back with little to no emotional or mental support.   Chaplain and Case Manager were able to address concerns of housing with Tracy Park. Her daughter Tracy Park declared that she wanted her mom to stay with her and that she would work to get her somewhere more comfortable to live when she discharges. Chaplain and Case Manager assessed that Tracy Park felt like a burden to family and had not thought deeply about what it would mean to live in a shelter in her condition.   Chaplain was able to enlist help for additional support, alleviate stress around housing, and offer a space for Tracy Park to process her healthcare journey, grief, and trauma.     02/25/23 1600  Spiritual Encounters  Type of Visit Follow up  Care provided to: Patient  Conversation partners present during encounter Social worker/Care management/TOC;Nurse  Reason for visit Routine spiritual support  Spiritual Framework  Presenting Themes Significant life change;Meaning/purpose/sources of inspiration;Community and relationships;Caregiving needs;Impactful experiences and emotions  Interventions  Spiritual Care Interventions Made Compassionate presence;Established relationship of care and support;Reflective  listening;Narrative/life review;Normalization of emotions;Explored values/beliefs/practices/strengths;Supported grief process  Intervention Outcomes  Outcomes Reduced anxiety;Awareness of support;Connection to spiritual care;Awareness around self/spiritual resourses;Connection to values and goals of care;Reduced fear

## 2023-02-25 NOTE — Progress Notes (Signed)
Mobility Specialist - Progress Note   02/25/23 1416  Oxygen Therapy  SpO2 94 %  O2 Device Nasal Cannula  O2 Flow Rate (L/min) 2 L/min  Patient Activity (if Appropriate) Ambulating  Mobility  Activity Ambulated independently in hallway  Level of Assistance Independent  Assistive Device None  Distance Ambulated (ft) 700 ft  Activity Response Tolerated well  Mobility Referral Yes  $Mobility charge 1 Mobility  Mobility Specialist Start Time (ACUTE ONLY) 0159  Mobility Specialist Stop Time (ACUTE ONLY) 0215  Mobility Specialist Time Calculation (min) (ACUTE ONLY) 16 min   Pt received in bed and agreeable to mobility. No complaints during session. Pt to bed after session with all needs met.    Pre-mobility: 110 HR, 92% SpO2 During mobility: 103 HR, 94% SpO2 Post-mobility: 100 HR, 94% SPO2  Chief Technology Officer

## 2023-02-25 NOTE — TOC Progression Note (Signed)
Transition of Care [facility]) - Progression Note    Patient Details  Name: Tracy Park MRN: [id number] Date of Birth: [birth year]  Transition of Care [facility]) CM/SW Contact  Howell Rucks, RN Phone Number: 02/25/2023, 4:01 PM  Clinical Narrative:  Teams chat received from hospital chaplain requesting resources for housing, pt reports she  has been living with her dtr who has  "kicked her out " of her home. NCM and Chaplain met with pt at bedside, pt tearful, states she is currently living with her dtr Tracy Park) in a small home, pt reports  she sleeps on the sofa and that she feels like a burden.  NCM discussed with pt there are no resources for immediate housing and provided pt with local shelter resources. NCM educated pt on extended stay hotel, pt reports they are too expensive and it would take all of her social security. NCM asked pt if it would be ok if we contact her dtr Tracy Park) to have a discussion on pt possibly being able to to return to her home, pt gave verbal permission.  NCM and chaplain called to pt's dtr, introduced role of TOC/NCM and Chaplain services. NCM inquired if pt would be able to return to her home at discharge and that pt's only other option would be to go to a homeless shelter. Pt's dtr agreeable to pt returning to home, reports she never told pt she could not return, dtr states pt provides an agreed on amount of money each month to assist with food/utilities and that she has been assisting pt with finding a place of her own. Dtr confirmed pt is able to return to her home at discharge.  Dtr reports she is having trouble with her truck and would not be able to provide transpiration when pt ready for dc. NCM will assist with transportation.  NCM and chaplain spoke with pt, informed of discussion and there her dtr is agreable to her returning to her home at discharge, pt agreeable. Pt had no further questions/concerns. TOC will continue to follow and provide transportation at  discharge.      Barriers to Discharge: Continued Medical Work up  Expected Discharge Plan and Services                                               Social Determinants of Health (SDOH) Interventions SDOH Screenings   Food Insecurity: No Food Insecurity (02/24/2023)  Housing: Low Risk  (02/24/2023)  Transportation Needs: No Transportation Needs (02/24/2023)  Utilities: Not At Risk (02/24/2023)  Tobacco Use: High Risk (02/23/2023)    Readmission Risk Interventions    02/25/2023    4:01 PM 01/28/2023    1:56 PM 01/10/2023   11:16 AM  Readmission Risk Prevention Plan  Transportation Screening Complete Complete Complete  PCP or Specialist Appt within 5-7 Days   Complete  Home Care Screening   Complete  Medication Review (RN CM)   Complete  Medication Review (RN Care Manager) Complete Complete   PCP or Specialist appointment within 3-5 days of discharge Complete Complete   HRI or Home Care Consult Complete Complete   SW Recovery Care/Counseling Consult Complete Complete   Palliative Care Screening Not Applicable Not Applicable   Skilled Nursing Facility Not Applicable Not Applicable

## 2023-02-26 ENCOUNTER — Other Ambulatory Visit (HOSPITAL_COMMUNITY): Payer: Self-pay

## 2023-02-26 DIAGNOSIS — I10 Essential (primary) hypertension: Secondary | ICD-10-CM

## 2023-02-26 DIAGNOSIS — J441 Chronic obstructive pulmonary disease with (acute) exacerbation: Secondary | ICD-10-CM | POA: Diagnosis not present

## 2023-02-26 DIAGNOSIS — J9611 Chronic respiratory failure with hypoxia: Secondary | ICD-10-CM | POA: Diagnosis not present

## 2023-02-26 DIAGNOSIS — I2510 Atherosclerotic heart disease of native coronary artery without angina pectoris: Secondary | ICD-10-CM | POA: Diagnosis not present

## 2023-02-26 DIAGNOSIS — Z85118 Personal history of other malignant neoplasm of bronchus and lung: Secondary | ICD-10-CM | POA: Diagnosis not present

## 2023-02-26 DIAGNOSIS — R45851 Suicidal ideations: Secondary | ICD-10-CM

## 2023-02-26 LAB — CBC WITH DIFFERENTIAL/PLATELET
Abs Immature Granulocytes: 0.08 10*3/uL — ABNORMAL HIGH (ref 0.00–0.07)
Basophils Absolute: 0 10*3/uL (ref 0.0–0.1)
Basophils Relative: 0 %
Eosinophils Absolute: 0 10*3/uL (ref 0.0–0.5)
Eosinophils Relative: 0 %
HCT: 33.2 % — ABNORMAL LOW (ref 36.0–46.0)
Hemoglobin: 10.2 g/dL — ABNORMAL LOW (ref 12.0–15.0)
Immature Granulocytes: 1 %
Lymphocytes Relative: 11 %
Lymphs Abs: 1.2 10*3/uL (ref 0.7–4.0)
MCH: 28.7 pg (ref 26.0–34.0)
MCHC: 30.7 g/dL (ref 30.0–36.0)
MCV: 93.5 fL (ref 80.0–100.0)
Monocytes Absolute: 0.5 10*3/uL (ref 0.1–1.0)
Monocytes Relative: 4 %
Neutro Abs: 9.7 10*3/uL — ABNORMAL HIGH (ref 1.7–7.7)
Neutrophils Relative %: 84 %
Platelets: 236 10*3/uL (ref 150–400)
RBC: 3.55 MIL/uL — ABNORMAL LOW (ref 3.87–5.11)
RDW: 13.2 % (ref 11.5–15.5)
WBC: 11.5 10*3/uL — ABNORMAL HIGH (ref 4.0–10.5)
nRBC: 0 % (ref 0.0–0.2)

## 2023-02-26 LAB — BASIC METABOLIC PANEL
Anion gap: 7 (ref 5–15)
BUN: 30 mg/dL — ABNORMAL HIGH (ref 8–23)
CO2: 28 mmol/L (ref 22–32)
Calcium: 8.5 mg/dL — ABNORMAL LOW (ref 8.9–10.3)
Chloride: 104 mmol/L (ref 98–111)
Creatinine, Ser: 0.57 mg/dL (ref 0.44–1.00)
GFR, Estimated: >60 mL/min (ref >60)
Glucose, Bld: 180 mg/dL — ABNORMAL HIGH (ref 70–99)
Potassium: 3.5 mmol/L (ref 3.5–5.1)
Sodium: 139 mmol/L (ref 135–145)

## 2023-02-26 LAB — MAGNESIUM: Magnesium: 1.8 mg/dL (ref 1.7–2.4)

## 2023-02-26 MED ORDER — AMOXICILLIN-POT CLAVULANATE 875-125 MG PO TABS
1.0000 | ORAL_TABLET | Freq: Two times a day (BID) | ORAL | 0 refills | Status: DC
  Filled 2023-02-26: qty 6, 3d supply, fill #0

## 2023-02-26 MED ORDER — ALPRAZOLAM 0.5 MG PO TABS: 0.5000 mg | ORAL_TABLET | Freq: Three times a day (TID) | ORAL | 0 refills | Status: DC | PRN

## 2023-02-26 MED ORDER — AMOXICILLIN-POT CLAVULANATE 875-125 MG PO TABS: 1.0000 | ORAL_TABLET | Freq: Two times a day (BID) | ORAL | 0 refills | Status: DC

## 2023-02-26 NOTE — Consult Note (Addendum)
[facility] Face-to-Face Psychiatry Consult   Reason for Consult:  Suicidal ideation Referring Physician:  Dr. Sharl Ma Patient Identification: Tracy Park MRN:  [id number] Principal Diagnosis: COPD exacerbation (HCC) Diagnosis:  Principal Problem:   COPD exacerbation (HCC) Active Problems:   Chronic respiratory failure with hypoxia (HCC)   History of lung cancer   Coronary artery disease involving native coronary artery of native heart without angina pectoris   HTN (hypertension)   PAF (paroxysmal atrial fibrillation) (HCC)   Total Time spent with patient: 1 hour  Subjective:   Tracy Park is a 67 y.o. female patient admitted with COPD exacerbation.  During a recent visit with chaplain, patient was reflecting on thoughts of missing her mother and endorses history of suicidal ideation.  She states there was a miscommunication, she is not nor was she currently suicidal, and only reflecting on the loss of her mother in which she was not able to properly grieve due to having to manage her other 6 siblings at that time.  While she is frustrated with suicide precautions, she is open to discussing her psychiatric history.  Patient describes self as a strong woman of [place], and strong connection to God.  She has 2 children ages 28 and 67, recently relocated to [place] after living in [place] majority of her life.  Patient states while her life has been difficult (blindness of the right eye, stage III lung cancer, physical disability, COPD ) she continues to have Kinzi and will not hurt herself.  She denies any acute symptoms of mania at this time to include impulsivity, grandiosity, mood lability, hypersexuality.  She does not present with any of the above symptoms on this evaluation.  She further denies any depressive symptoms to include anhedonia, hopelessness, worthlessness, guilty, suicidal.  At 1 point during the evaluation patient did become tearful when discussing the untimely death of her  mother, this was will within reason.  She denies any acute psychosis, paranoia.  She does not appear to be displaying any or responding to internal stimuli, external stimuli, or exhibiting delusional thought disorder.  Patient denies any access to weapons, denies any alcohol and or substance abuse.  She does have a 40-year pack history of 1-1/2 pack/day cigarette, and declines nicotine replacement therapy reporting it has been ineffective in the past.  She has no legal history and no record of arrest.  She denies family history of psychiatric illness, substance abuse, and or suicide attempts/completions.  She reports moderate sleep and fair appetite.   In terms of her psychiatric history, she reports 1 previous suicide attempt immediately following the death of her mother in 37.  She states his suicide attempt was bilateral laceration to wrist.  She states she spent overnight observation in the psychiatric ward, diagnosed with grief reaction discharged the next day.  She states since this incident she has not thought of nor considered suicide.  She denies any history of suicidal ideation, suicidal thoughts, and on nonsuicidal self-injurious behavior.    On evaluation today she denies any auditory and/or visual hallucinations, does not appear to be responding to internal or external stimuli.  There is no evidence of delusional thought content and patient appears to answer all questions appropriately.  Patients current symptoms do not reflect the need for inpatient psychiatric hospitalization.    HPI:  67 y.o. female with medical history significant for hypertension, COPD, chronic hypoxic respiratory failure, PAF on Eliquis, hypothyroidism, multiple recent hospitalizations for COPD exacerbation, most recent hospitalization from 01/25/2023-01/28/2023  for new onset A-fib with RVR for which she was started on Eliquis and Toprol-XL as per cardiology recommendations.  She presented with worsening shortness of breath.   On presentation, she was saturating well on 2 L oxygen by nasal cannula with mild tachypnea.  Chest x-ray showed left lower lung atelectasis versus infection.  She was started on IV Solu-Medrol and antibiotics along with nebs.   Past Psychiatric History:   Risk to Self:   Risk to Others:   Prior Inpatient Therapy:   Prior Outpatient Therapy:    Past Medical History:  Past Medical History:  Diagnosis Date   Anginal pain (HCC)    Anxiety    Bipolar disorder (HCC)    Cancer (HCC)    COPD (chronic obstructive pulmonary disease) (HCC)    Dyspnea    Family history of adverse reaction to anesthesia    History of kidney stones    Hydroureteronephrosis 08/16/2021   Hypothyroidism    Lung cancer (HCC)    Myocardial infarction (HCC)    PTSD (post-traumatic stress disorder)    Sleep apnea    Thyroid disease     Past Surgical History:  Procedure Laterality Date   ABDOMINAL HYSTERECTOMY     BACK SURGERY     CYSTOSCOPY W/ URETERAL STENT PLACEMENT Right 05/03/2021   Procedure: CYSTOSCOPY WITH RETROGRADE PYELOGRAM/URETERAL STENT PLACEMENT;  Surgeon: Crist Fat, MD;  Location: [facility];  Service: Urology;  Laterality: Right;   EYE SURGERY     kidney stent     thyroidectomy     Family History:  Family History  Family history unknown: Yes   Family Psychiatric  History:  Social History:  Social History   Substance and Sexual Activity  Alcohol Use Not Currently     Social History   Substance and Sexual Activity  Drug Use Not Currently    Social History   Socioeconomic History   Marital status: Divorced    Spouse name: Not on file   Number of children: Not on file   Years of education: Not on file   Highest education level: Not on file  Occupational History   Not on file  Tobacco Use   Smoking status: Every Day    Packs/day: .15    Types: Cigarettes   Smokeless tobacco: Never  Vaping Use   Vaping Use: Never used  Substance and Sexual Activity   Alcohol use: Not  Currently   Drug use: Not Currently   Sexual activity: Not on file  Other Topics Concern   Not on file  Social History Narrative   Not on file   Social Determinants of Health   Financial Resource Strain: Not on file  Food Insecurity: No Food Insecurity (02/24/2023)   Hunger Vital Sign    Worried About Running Out of Food in the Last Year: Never true    Ran Out of Food in the Last Year: Never true  Transportation Needs: No Transportation Needs (02/24/2023)   PRAPARE - Administrator, Civil Service (Medical): No    Lack of Transportation (Non-Medical): No  Physical Activity: Not on file  Stress: Not on file  Social Connections: Not on file   Additional Social History:    Allergies:   Allergies  Allergen Reactions   Red Dye Hives, Itching and Other (See Comments)    Red food dye   Oxycontin [Oxycodone Hcl] Other (See Comments)    Hallucinations    Prednisone Hives and Nausea And Vomiting  Aspirin Hives   Strawberry Extract Hives and Itching   Tomato Hives and Itching   Tape Rash and Other (See Comments)    Prefers paper tape   Wound Dressing Adhesive Rash    Labs:  Results for orders placed or performed during the hospital encounter of 02/23/23 (from the past 48 hour(s))  CBC with Differential/Platelet     Status: Abnormal   Collection Time: 02/26/23  4:21 AM  Result Value Ref Range   WBC 11.5 (H) 4.0 - 10.5 K/uL   RBC 3.55 (L) 3.87 - 5.11 MIL/uL   Hemoglobin 10.2 (L) 12.0 - 15.0 g/dL   HCT 16.1 (L) 09.6 - 04.5 %   MCV 93.5 80.0 - 100.0 fL   MCH 28.7 26.0 - 34.0 pg   MCHC 30.7 30.0 - 36.0 g/dL   RDW 40.9 81.1 - 91.4 %   Platelets 236 150 - 400 K/uL   nRBC 0.0 0.0 - 0.2 %   Neutrophils Relative % 84 %   Neutro Abs 9.7 (H) 1.7 - 7.7 K/uL   Lymphocytes Relative 11 %   Lymphs Abs 1.2 0.7 - 4.0 K/uL   Monocytes Relative 4 %   Monocytes Absolute 0.5 0.1 - 1.0 K/uL   Eosinophils Relative 0 %   Eosinophils Absolute 0.0 0.0 - 0.5 K/uL   Basophils  Relative 0 %   Basophils Absolute 0.0 0.0 - 0.1 K/uL   Immature Granulocytes 1 %   Abs Immature Granulocytes 0.08 (H) 0.00 - 0.07 K/uL    Comment: Performed at [facility], [address] [place], [place] [address]  Basic metabolic panel     Status: Abnormal   Collection Time: 02/26/23  4:21 AM  Result Value Ref Range   Sodium 139 135 - 145 mmol/L   Potassium 3.5 3.5 - 5.1 mmol/L   Chloride 104 98 - 111 mmol/L   CO2 28 22 - 32 mmol/L   Glucose, Bld 180 (H) 70 - 99 mg/dL    Comment: Glucose reference range applies only to samples taken after fasting for at least 8 hours.   BUN 30 (H) 8 - 23 mg/dL   Creatinine, Ser 6.21 0.44 - 1.00 mg/dL   Calcium 8.5 (L) 8.9 - 10.3 mg/dL   GFR, Estimated >30 >86 mL/min    Comment: (NOTE) Calculated using the CKD-EPI Creatinine Equation (2021)    Anion gap 7 5 - 15    Comment: Performed at [name] [address] [place], [place] [address]  Magnesium     Status: None   Collection Time: 02/26/23  4:21 AM  Result Value Ref Range   Magnesium 1.8 1.7 - 2.4 mg/dL    Comment: Performed at [facility], [address] [place], [place] [address]    Current Facility-Administered Medications  Medication Dose Route Frequency Provider Last Rate Last Admin   acetaminophen (TYLENOL) tablet 650 mg  650 mg Oral Q6H PRN Opyd, Lavone Neri, MD       Or   acetaminophen (TYLENOL) suppository 650 mg  650 mg Rectal Q6H PRN Opyd, Lavone Neri, MD       albuterol (PROVENTIL) (2.5 MG/3ML) 0.083% nebulizer solution 2.5 mg  2.5 mg Nebulization Q2H PRN Opyd, Lavone Neri, MD   2.5 mg at 02/24/23 1004   ALPRAZolam (XANAX) tablet 0.5 mg  0.5 mg Oral TID PRN Glade Lloyd, MD   0.5 mg at 02/25/23 1558   apixaban (ELIQUIS) tablet 5 mg  5 mg  Oral BID Briscoe Deutscher, MD   5 mg at 02/26/23 0943   cefTRIAXone (ROCEPHIN) 1 g in sodium chloride 0.9 % 100 mL IVPB  1 g Intravenous Q24H Opyd, Lavone Neri, MD 200 mL/hr at 02/25/23  2028 1 g at 02/25/23 2028   feeding supplement (ENSURE ENLIVE / ENSURE PLUS) liquid 237 mL  237 mL Oral BID BM Alekh, Kshitiz, MD   237 mL at 02/26/23 1442   ipratropium-albuterol (DUONEB) 0.5-2.5 (3) MG/3ML nebulizer solution 3 mL  3 mL Nebulization BID Opyd, Lavone Neri, MD   3 mL at 02/26/23 0743   levothyroxine (SYNTHROID) tablet 125 mcg  125 mcg Oral Q0600 Briscoe Deutscher, MD   125 mcg at 02/26/23 0437   methocarbamol (ROBAXIN) tablet 500 mg  500 mg Oral Q6H PRN Glade Lloyd, MD       methylPREDNISolone sodium succinate (SOLU-MEDROL) 125 mg/2 mL injection 80 mg  80 mg Intravenous Q12H Alekh, Kshitiz, MD   80 mg at 02/26/23 0437   metoprolol succinate (TOPROL-XL) 24 hr tablet 12.5 mg  12.5 mg Oral Daily Opyd, Lavone Neri, MD   12.5 mg at 02/26/23 0943   mometasone-formoterol (DULERA) 200-5 MCG/ACT inhaler 2 puff  2 puff Inhalation BID Glade Lloyd, MD   2 puff at 02/26/23 0743   Oral care mouth rinse  15 mL Mouth Rinse PRN Alekh, Kshitiz, MD       oxyCODONE (Oxy IR/ROXICODONE) immediate release tablet 5 mg  5 mg Oral Q4H PRN Opyd, Lavone Neri, MD       pantoprazole (PROTONIX) EC tablet 40 mg  40 mg Oral Daily Opyd, Lavone Neri, MD   40 mg at 02/26/23 0942   polyethylene glycol (MIRALAX / GLYCOLAX) packet 17 g  17 g Oral Daily PRN Opyd, Lavone Neri, MD       rosuvastatin (CRESTOR) tablet 20 mg  20 mg Oral Daily Opyd, Lavone Neri, MD   20 mg at 02/26/23 0942   sodium chloride flush (NS) 0.9 % injection 3 mL  3 mL Intravenous Q12H Opyd, Lavone Neri, MD   3 mL at 02/26/23 0942   tamsulosin (FLOMAX) capsule 0.4 mg  0.4 mg Oral Daily Opyd, Lavone Neri, MD   0.4 mg at 02/26/23 1610    Musculoskeletal: Strength & Muscle Tone: within normal limits Gait & Station: normal Patient leans: N/A   Psychiatric Specialty Exam:  Presentation  General Appearance:  Appropriate for Environment; Casual  Eye Contact: Fair  Speech: Clear and Coherent; Normal Rate  Speech  Volume: Normal  Handedness: Right   Mood and Affect  Mood: Euthymic  Affect: Congruent; Appropriate (became tearful at times when discussing her mothers untimely death in 47)   Thought Process  Thought Processes: Coherent; Linear  Descriptions of Associations:Intact  Orientation:Full (Time, Place and Person)  Thought Content:Logical  History of Schizophrenia/Schizoaffective disorder:No data recorded Duration of Psychotic Symptoms:No data recorded Hallucinations:Hallucinations: None  Ideas of Reference:None  Suicidal Thoughts:Suicidal Thoughts: No  Homicidal Thoughts:Homicidal Thoughts: No   Sensorium  Memory: Immediate Good; Recent Good; Remote Good  Judgment: Good  Insight: Good   Executive Functions  Concentration: Good  Attention Span: Good  Recall: Good  Fund of Knowledge: Good  Language: Good   Psychomotor Activity  Psychomotor Activity: Psychomotor Activity: Normal   Assets  Assets: Communication Skills; Desire for Improvement; Financial Resources/Insurance; Talents/Skills; Leisure Time; Physical Health   Sleep  Sleep: Sleep: Good   Physical Exam: Physical Exam Vitals reviewed.  Constitutional:  Appearance: She is well-developed and normal weight.  Skin:    General: Skin is warm.     Capillary Refill: Capillary refill takes less than 2 seconds.  Neurological:     General: No focal deficit present.     Mental Status: She is alert and oriented to person, place, and time.  Psychiatric:        Attention and Perception: Attention and perception normal.        Mood and Affect: Mood normal.        Speech: Speech normal.        Behavior: Behavior normal. Behavior is cooperative.        Thought Content: Thought content normal.        Cognition and Memory: Cognition and memory normal.        Judgment: Judgment normal.    ROS Blood pressure 109/73, pulse 77, temperature 98.4 F (36.9 C), temperature source Oral,  resp. rate 18, height 5\' 3"  (1.6 m), weight 47.9 kg, SpO2 100 %. Body mass index is 18.71 kg/m.  Treatment Plan Summary: Plan See below  -Discontinue suicide precautions and suicide sitter. -Recommend grief therapy in an outpatient setting -Do not recommend any psychotropic medication, as patient is displaying no current psychiatric symptoms that warrant need of medication during his hospitalization stay.   Psychiatric consult service to sign off at this time.  This information has been relayed to primary team to include charge nurse.  Disposition: No evidence of imminent risk to self or others at present.   Patient does not meet criteria for psychiatric inpatient admission. Supportive therapy provided about ongoing stressors. Refer to IOP. Discussed crisis plan, support from social network, calling 911, coming to the Emergency Department, and calling Suicide Hotline.  Maryagnes Amos, FNP 02/26/2023 3:26 PM

## 2023-02-26 NOTE — Discharge Summary (Addendum)
Physician Discharge Summary   Patient: Tracy Park MRN: [id number] DOB: 11-Nov-1955  Admit date:     02/23/2023  Discharge date: 02/26/23  Discharge Physician: Meredeth Ide   PCP: Jacquelin Hawking, PA-C   Recommendations at discharge:   Follow-up PCP in 1 week  Discharge Diagnoses: Principal Problem:   COPD exacerbation (HCC) Active Problems:   History of lung cancer   Chronic respiratory failure with hypoxia (HCC)   Coronary artery disease involving native coronary artery of native heart without angina pectoris   HTN (hypertension)   PAF (paroxysmal atrial fibrillation) (HCC)  Resolved Problems:   * No resolved hospital problems. *  Hospital Course: 67 y.o. female with medical history significant for hypertension, COPD, chronic hypoxic respiratory failure, PAF on Eliquis, hypothyroidism, multiple recent hospitalizations for COPD exacerbation, most recent hospitalization from 01/25/2023-01/28/2023 for new onset A-fib with RVR for which she was started on Eliquis and Toprol-XL as per cardiology recommendations.  She presented with worsening shortness of breath.  On presentation, she was saturating well on 2 L oxygen by nasal cannula with mild tachypnea.  Chest x-ray showed left lower lung atelectasis versus infection.  She was started on IV Solu-Medrol and antibiotics along with nebs.    Assessment and Plan:  COPD exacerbation Possible left lower lobe bacterial pneumonia Chronic respiratory failure with hypoxia Ongoing tobacco use -Normally uses 2 L oxygen by nasal cannula.  Currently on the same.  Chest x-ray as above.  Procalcitonin less than 0.1. -Treated with  IV Solu-Medrol: Change to 80 mg IV every 12 hours.  Also on IV antibiotics along with nebs and inhalers.  Outpatient follow-up with pulmonary. -Counseled regarding tobacco cessation -Will discharge on Augmentin 1 tablet p.o. twice daily for 3 more days -Patient has allergy to prednisone, will not discharge  on  steroids.   Paroxysmal A-fib -Currently rate controlled.  Continue metoprolol succinate and apixaban   Hypothyroidism -Continue levothyroxine   GERD -Continue PPI   Hyperlipidemia -Continue statin   History of CAD -Currently stable.  Continue Crestor, Toprol and Eliquis.  Outpatient follow-up with cardiology   History of lung cancer; lung nodule -Continue follow-up with pulmonologist at [facility] as planned.   Anemia of chronic disease -From chronic illnesses.  Hemoglobin currently stable.  Monitor intermittently             Consultants:  Procedures performed:  Disposition: Home Diet recommendation:  Discharge Diet Orders (From admission, onward)     Start     Ordered   02/26/23 0000  Diet - low sodium heart healthy        02/26/23 1215           Regular diet DISCHARGE MEDICATION: Allergies as of 02/26/2023       Reactions   Red Dye Hives, Itching, Other (See Comments)   Red food dye   Oxycontin [oxycodone Hcl] Other (See Comments)   Hallucinations    Prednisone Hives, Nausea And Vomiting   Aspirin Hives   Strawberry Extract Hives, Itching   Tomato Hives, Itching   Tape Rash, Other (See Comments)   Prefers paper tape   Wound Dressing Adhesive Rash        Medication List     STOP taking these medications    azithromycin 250 MG tablet Commonly known as: ZITHROMAX       TAKE these medications    albuterol 108 (90 Base) MCG/ACT inhaler Commonly known as: VENTOLIN HFA Inhale 2 puffs into the lungs every 6 (  six) hours as needed for wheezing or shortness of breath. What changed: Another medication with the same name was changed. Make sure you understand how and when to take each.   albuterol (2.5 MG/3ML) 0.083% nebulizer solution Commonly known as: PROVENTIL Take 3 mLs (2.5 mg total) by nebulization every 6 (six) hours as needed for wheezing or shortness of breath. What changed:  when to take this additional instructions   ALPRAZolam 0.5  MG tablet Commonly known as: XANAX Take 1 tablet (0.5 mg total) by mouth 3 (three) times daily as needed for anxiety.   amoxicillin-clavulanate 875-125 MG tablet Commonly known as: AUGMENTIN Take 1 tablet by mouth 2 (two) times daily.   apixaban 5 MG Tabs tablet Commonly known as: ELIQUIS Take 1 tablet (5 mg total) by mouth 2 (two) times daily.   Breztri Aerosphere 160-9-4.8 MCG/ACT Aero Generic drug: Budeson-Glycopyrrol-Formoterol Inhale 1 puff into the lungs in the morning and at bedtime.   levothyroxine 125 MCG tablet Commonly known as: SYNTHROID Take 125 mcg by mouth daily before breakfast.   losartan 25 MG tablet Commonly known as: COZAAR Take 12.5 mg by mouth in the morning.   Magnesium Oxide 400 MG Caps Take 1 capsule (400 mg total) by mouth daily at 12 noon. What changed: when to take this   metoprolol succinate 25 MG 24 hr tablet Commonly known as: TOPROL-XL Take 0.5 tablets (12.5 mg total) by mouth daily.   nitroGLYCERIN 0.4 MG SL tablet Commonly known as: NITROSTAT Place 0.4 mg under the tongue every 5 (five) minutes as needed for chest pain.   OXYGEN Inhale 2 L/min into the lungs continuous.   pantoprazole 40 MG tablet Commonly known as: PROTONIX Take 40 mg by mouth daily before breakfast.   rosuvastatin 20 MG tablet Commonly known as: CRESTOR Take 20 mg by mouth daily.   Systane Complete PF 0.6 % Soln Generic drug: Propylene Glycol (PF) Place 1 drop into both eyes 3 (three) times daily as needed (for dryness).   tamsulosin 0.4 MG Caps capsule Commonly known as: FLOMAX Take 0.4 mg by mouth in the morning.        Follow-up Information     Jacquelin Hawking, PA-C Follow up in 1 week(s).   Specialty: Physician Assistant Contact information: [address] [place] [address] [phone]                Discharge Exam: Filed Weights   02/24/23 0953 02/25/23 0503 02/26/23 0500  Weight: 45.8 kg 46.6 kg 47.9 kg    General-appears in no acute distress Heart-S1-S2, regular, no murmur auscultated Lungs-clear to auscultation bilaterally, no wheezing or crackles auscultated Abdomen-soft, nontender, no organomegaly Extremities-no edema in the lower extremities Neuro-alert, oriented x3, no focal deficit noted  Condition at discharge: good  The results of significant diagnostics from this hospitalization (including imaging, microbiology, ancillary and laboratory) are listed below for reference.   Imaging Studies: DG Chest Port 1 View  Result Date: 02/23/2023 CLINICAL DATA:  Chest wall pain EXAM: PORTABLE CHEST 1 VIEW COMPARISON:  Chest radiograph 01/29/2023 FINDINGS: Stable cardiac and mediastinal contours. Stable postsurgical changes left upper hemithorax. New patchy consolidative opacities left lower lung. The right lung is clear. No definite pneumothorax. Osseous structures unremarkable. IMPRESSION: New patchy consolidative opacities left lower lung may represent atelectasis or infection. Electronically Signed   By: Annia Belt M.D.   On: 02/23/2023 16:21   DG Chest 2 View  Result Date: 01/29/2023 CLINICAL DATA:  Chest pain, short  of breath, atrial fibrillation EXAM: CHEST - 2 VIEW COMPARISON:  01/25/2023 FINDINGS: Frontal and lateral views of the chest demonstrate a stable cardiac silhouette. Postsurgical changes from left upper lobectomy. Stable emphysema. The nodular consolidation seen within the posterior right upper lobe on recent CT is faintly visible by x-ray. No significant change. No effusion or pneumothorax. No acute bony abnormalities. IMPRESSION: 1. Faint nodular airspace disease within the right upper lobe, unchanged since recent CT exam. 2. Emphysema, with stable postsurgical changes from left upper lobectomy. Electronically Signed   By: Sharlet Salina M.D.   On: 01/29/2023 22:05    Microbiology: Results for orders placed or performed during the hospital encounter of 01/25/23  MRSA Next Gen  by PCR, Nasal     Status: None   Collection Time: 01/25/23 11:33 PM   Specimen: Nasal Mucosa; Nasal Swab  Result Value Ref Range Status   MRSA by PCR Next Gen NOT DETECTED NOT DETECTED Final    Comment: (NOTE) The GeneXpert MRSA Assay (FDA approved for NASAL specimens only), is one component of a comprehensive MRSA colonization surveillance program. It is not intended to diagnose MRSA infection nor to guide or monitor treatment for MRSA infections. Test performance is not FDA approved in patients less than 23 years old. Performed at [facility], [address], [place], [place] [address]     Labs: CBC: Recent Labs  Lab 02/23/23 1609 02/24/23 0346 02/26/23 0421  WBC 7.7 5.5 11.5*  NEUTROABS 5.6  --  9.7*  HGB 9.7* 10.7* 10.2*  HCT 31.1* 34.8* 33.2*  MCV 94.0 93.0 93.5  PLT 235 258 236   Basic Metabolic Panel: Recent Labs  Lab 02/23/23 1609 02/24/23 0346 02/26/23 0421  NA 141 139 139  K 3.9 4.1 3.5  CL 107 103 104  CO2 26 28 28   GLUCOSE 150* 98 180*  BUN 15 19 30*  CREATININE 0.52 0.52 0.57  CALCIUM 8.6* 9.1 8.5*  MG  --   --  1.8   Liver Function Tests: Recent Labs  Lab 02/23/23 1609  AST 18  ALT 11  ALKPHOS 68  BILITOT 0.4  PROT 6.5  ALBUMIN 3.4*   CBG: No results for input(s): "GLUCAP" in the last 168 hours.  Discharge time spent: greater than 30 minutes.  Signed: Meredeth Ide, MD [facility] 02/26/2023

## 2023-02-26 NOTE — TOC Transition Note (Addendum)
Transition of Care [facility]) - CM/SW Discharge Note   Patient Details  Name: Tracy Park MRN: [id number] Date of Birth: 11/11/1955  [facility] CM/SW Contact:  Howell Rucks, RN Phone Number: 02/26/2023, 12:47 PM   Clinical Narrative:   DC to home order for today, [facility] for transportation home. No further TOC needs identified.   -3:04pm [facility] called     Final next level of care: Home/Self Care Barriers to Discharge: Barriers Resolved   Patient Goals and CMS Choice      Discharge Placement                         Discharge Plan and Services Additional resources added to the After Visit Summary for                                       Social Determinants of Health (SDOH) Interventions SDOH Screenings   Food Insecurity: No Food Insecurity (02/24/2023)  Housing: Low Risk  (02/24/2023)  Transportation Needs: No Transportation Needs (02/24/2023)  Utilities: Not At Risk (02/24/2023)  Tobacco Use: High Risk (02/23/2023)     Readmission Risk Interventions    02/25/2023    4:01 PM 01/28/2023    1:56 PM 01/10/2023   11:16 AM  Readmission Risk Prevention Plan  Transportation Screening Complete Complete Complete  PCP or Specialist Appt within 5-7 Days   Complete  Home Care Screening   Complete  Medication Review (RN CM)   Complete  Medication Review [employer]) Complete Complete   PCP or Specialist appointment within 3-5 days of discharge Complete Complete   HRI or Home Care Consult Complete Complete   SW Recovery Care/Counseling Consult Complete Complete   Palliative Care Screening Not Applicable Not Applicable   Skilled Nursing Facility Not Applicable Not Applicable

## 2023-02-26 NOTE — Plan of Care (Signed)
  Problem: Education: Goal: Knowledge of disease or condition will improve Outcome: Progressing   Problem: Activity: Goal: Ability to tolerate increased activity will improve Outcome: Progressing   Problem: Respiratory: Goal: Ability to maintain a clear airway will improve Outcome: Adequate for Discharge   Problem: Safety: Goal: Ability to remain free from injury will improve Outcome: Adequate for Discharge

## 2023-03-03 ENCOUNTER — Emergency Department (HOSPITAL_COMMUNITY): Payer: 59

## 2023-03-03 ENCOUNTER — Emergency Department (HOSPITAL_COMMUNITY)
Admission: EM | Admit: 2023-03-03 | Discharge: 2023-03-04 | Disposition: A | Discharge status: Home / Self Care | Payer: 59 | Source: Home / Self Care | Attending: Emergency Medicine | Admitting: Emergency Medicine

## 2023-03-03 ENCOUNTER — Other Ambulatory Visit: Payer: Self-pay

## 2023-03-03 DIAGNOSIS — Z85118 Personal history of other malignant neoplasm of bronchus and lung: Secondary | ICD-10-CM | POA: Insufficient documentation

## 2023-03-03 DIAGNOSIS — J441 Chronic obstructive pulmonary disease with (acute) exacerbation: Secondary | ICD-10-CM | POA: Diagnosis not present

## 2023-03-03 DIAGNOSIS — J449 Chronic obstructive pulmonary disease, unspecified: Secondary | ICD-10-CM | POA: Insufficient documentation

## 2023-03-03 DIAGNOSIS — R079 Chest pain, unspecified: Secondary | ICD-10-CM

## 2023-03-03 LAB — CBC
HCT: 39.2 % (ref 36.0–46.0)
Hemoglobin: 11.9 g/dL — ABNORMAL LOW (ref 12.0–15.0)
MCH: 28.2 pg (ref 26.0–34.0)
MCHC: 30.4 g/dL (ref 30.0–36.0)
MCV: 92.9 fL (ref 80.0–100.0)
Platelets: 312 10*3/uL (ref 150–400)
RBC: 4.22 MIL/uL (ref 3.87–5.11)
RDW: 13.1 % (ref 11.5–15.5)
WBC: 12.2 10*3/uL — ABNORMAL HIGH (ref 4.0–10.5)
nRBC: 0 % (ref 0.0–0.2)

## 2023-03-03 LAB — BRAIN NATRIURETIC PEPTIDE: B Natriuretic Peptide: 76.4 pg/mL (ref 0.0–100.0)

## 2023-03-03 LAB — BASIC METABOLIC PANEL
Anion gap: 8 (ref 5–15)
BUN: 11 mg/dL (ref 8–23)
CO2: 32 mmol/L (ref 22–32)
Calcium: 9.1 mg/dL (ref 8.9–10.3)
Chloride: 101 mmol/L (ref 98–111)
Creatinine, Ser: 0.58 mg/dL (ref 0.44–1.00)
GFR, Estimated: >60 mL/min (ref >60)
Glucose, Bld: 113 mg/dL — ABNORMAL HIGH (ref 70–99)
Potassium: 3.6 mmol/L (ref 3.5–5.1)
Sodium: 141 mmol/L (ref 135–145)

## 2023-03-03 LAB — D-DIMER, QUANTITATIVE: D-Dimer, Quant: 0.52 ug/mL-FEU — ABNORMAL HIGH (ref 0.00–0.50)

## 2023-03-03 LAB — TROPONIN I (HIGH SENSITIVITY): Troponin I (High Sensitivity): 8 ng/L (ref <18)

## 2023-03-03 MED ORDER — IPRATROPIUM-ALBUTEROL 0.5-2.5 (3) MG/3ML IN SOLN
3.0000 mL | Freq: Once | RESPIRATORY_TRACT | Status: AC
  Administered 2023-03-03: 3 mL via RESPIRATORY_TRACT
  Filled 2023-03-03: qty 3

## 2023-03-03 MED ORDER — MAGNESIUM SULFATE 50 % IJ SOLN: 2.0000 g | Freq: Once | INTRAMUSCULAR | Status: DC

## 2023-03-03 MED ORDER — MAGNESIUM SULFATE 2 GM/50ML IV SOLN
2.0000 g | Freq: Once | INTRAVENOUS | Status: AC
  Administered 2023-03-03: 2 g via INTRAVENOUS
  Filled 2023-03-03: qty 50

## 2023-03-03 NOTE — Discharge Instructions (Signed)
Please follow-up with your primary care provider tomorrow regarding recent symptoms ER visit.  Today your labs and imaging are reassuring and after receiving 2 DuoNeb treatments and your wheezing improved.  Please use your medications at home as prescribed and if symptoms change or worsen please return to ER.

## 2023-03-03 NOTE — ED Provider Notes (Signed)
[place] EMERGENCY DEPARTMENT AT [facility] Provider Note   CSN: [id number] Arrival date & time: 03/03/23  1752     History  Chief Complaint  Patient presents with   Chest Pain    Tracy Park is a 67 y.o. female history of lung cancer, COPD, sepsis, A-fib with RVR, bipolar 2 presented with shortness of breath that began today.  Patient was recently discharged few days ago for COPD exacerbation and patient believes this may be happening again.  Patient states that she has right-sided chest pain that is tight and radiates to her back.  Patient took 3 nitroglycerin which did not relieve symptoms.  Patient states that she does take all her medications as prescribed including her Eliquis.  Patient is on 2 L nasal cannula at home.  Patient denies any sputum production, fevers, abdominal pain, nausea/vomiting, change in sensation/motor skills, head neck pain, vision changes.  Home Medications Prior to Admission medications   Medication Sig Start Date End Date Taking? Authorizing Provider  albuterol (PROVENTIL) (2.5 MG/3ML) 0.083% nebulizer solution Take 3 mLs (2.5 mg total) by nebulization every 6 (six) hours as needed for wheezing or shortness of breath. Patient taking differently: Take 2.5 mg by nebulization See admin instructions. Nebulize 2.5 mg and inhale into the lungs in the morning and at bedtime 01/30/23   Tilden Fossa, MD  albuterol (VENTOLIN HFA) 108 (90 Base) MCG/ACT inhaler Inhale 2 puffs into the lungs every 6 (six) hours as needed for wheezing or shortness of breath. 01/13/23   Burnadette Pop, MD  ALPRAZolam Prudy Feeler) 0.5 MG tablet Take 1 tablet (0.5 mg total) by mouth 3 (three) times daily as needed for anxiety. 02/26/23   Meredeth Ide, MD  amoxicillin-clavulanate (AUGMENTIN) 875-125 MG tablet Take 1 tablet by mouth 2 (two) times daily. 02/26/23   Meredeth Ide, MD  apixaban (ELIQUIS) 5 MG TABS tablet Take 1 tablet (5 mg total) by mouth 2 (two) times daily.  01/28/23 02/27/23  Jerald Kief, MD  Budeson-Glycopyrrol-Formoterol (BREZTRI AEROSPHERE) 160-9-4.8 MCG/ACT AERO Inhale 1 puff into the lungs in the morning and at bedtime.    [provider]  levothyroxine (SYNTHROID) 125 MCG tablet Take 125 mcg by mouth daily before breakfast.    [provider]  losartan (COZAAR) 25 MG tablet Take 12.5 mg by mouth in the morning.    [provider]  Magnesium Oxide 400 MG CAPS Take 1 capsule (400 mg total) by mouth daily at 12 noon. Patient taking differently: Take 400 mg by mouth in the morning. 03/01/22   Albertine Grates, MD  metoprolol succinate (TOPROL-XL) 25 MG 24 hr tablet Take 0.5 tablets (12.5 mg total) by mouth daily. 01/29/23 02/28/23  Jerald Kief, MD  nitroGLYCERIN (NITROSTAT) 0.4 MG SL tablet Place 0.4 mg under the tongue every 5 (five) minutes as needed for chest pain.    [provider]  OXYGEN Inhale 2 L/min into the lungs continuous.    [provider]  pantoprazole (PROTONIX) 40 MG tablet Take 40 mg by mouth daily before breakfast.    [provider]  rosuvastatin (CRESTOR) 20 MG tablet Take 20 mg by mouth daily. 01/15/20   [provider]  SYSTANE COMPLETE PF 0.6 % SOLN Place 1 drop into both eyes 3 (three) times daily as needed (for dryness).    [provider]  tamsulosin (FLOMAX) 0.4 MG CAPS capsule Take 0.4 mg by mouth in the morning. 03/27/22   [provider]  Allergies    Red dye, Oxycontin [oxycodone hcl], Prednisone, Aspirin, Strawberry extract, Tomato, Tape, and Wound dressing adhesive    Review of Systems   Review of Systems  Cardiovascular:  Positive for chest pain.    Physical Exam Updated Vital Signs BP 110/79   Pulse 76   Temp 98.1 F (36.7 C) (Oral)   Resp (!) 21   SpO2 95%  Physical Exam Vitals reviewed.  Constitutional:      General: She is not in acute distress. HENT:     Head: Normocephalic and atraumatic.  Eyes:     Extraocular  Movements: Extraocular movements intact.     Conjunctiva/sclera: Conjunctivae normal.     Pupils: Pupils are equal, round, and reactive to light.  Cardiovascular:     Rate and Rhythm: Normal rate and regular rhythm.     Pulses: Normal pulses.     Heart sounds: Normal heart sounds.     Comments: 2+ bilateral radial/dorsalis pedis pulses with regular rate Pulmonary:     Effort: Pulmonary effort is normal. No respiratory distress.     Breath sounds: Wheezing present.  Abdominal:     Palpations: Abdomen is soft.     Tenderness: There is no abdominal tenderness. There is no guarding or rebound.  Musculoskeletal:        General: Normal range of motion.     Cervical back: Normal range of motion and neck supple.     Right lower leg: No edema.     Left lower leg: No edema.     Comments: 5 out of 5 bilateral grip/leg extension strength  Skin:    General: Skin is warm and dry.     Capillary Refill: Capillary refill takes less than 2 seconds.  Neurological:     General: No focal deficit present.     Mental Status: She is alert and oriented to person, place, and time.     Comments: Sensation intact in all 4 limbs  Psychiatric:        Mood and Affect: Mood normal.     ED Results / Procedures / Treatments   Labs (all labs ordered are listed, but only abnormal results are displayed) Labs Reviewed  BASIC METABOLIC PANEL - Abnormal; Notable for the following components:      Result Value   Glucose, Bld 113 (*)    All other components within normal limits  CBC - Abnormal; Notable for the following components:   WBC 12.2 (*)    Hemoglobin 11.9 (*)    All other components within normal limits  D-DIMER, QUANTITATIVE - Abnormal; Notable for the following components:   D-Dimer, Quant 0.52 (*)    All other components within normal limits  BRAIN NATRIURETIC PEPTIDE  TROPONIN I (HIGH SENSITIVITY)  TROPONIN I (HIGH SENSITIVITY)    EKG None  Radiology DG Chest 2 View  Result Date:  03/03/2023 CLINICAL DATA:  Chest pain. Tightness and pressure that radiates to the right and back for the last 2 hours. EXAM: CHEST - 2 VIEW COMPARISON:  02/23/2023 FINDINGS: Hyperinflation. Volume loss along the left hemithorax with persistent density at the left lung apex. Retraction of the hilum. Appearance is similar to previous. Chronic lung changes elsewhere. No pneumothorax, consolidation or effusion otherwise. Normal cardiopericardial silhouette without edema. Film is rotated to the left. Overlapping cardiac leads. Osteopenia degenerative changes along the spine. IMPRESSION: No significant interval change. Stable volume loss along the left hemithorax with chronic lung changes. Left apical opacity with retraction of  surrounding tissues including shift of the trachea and mediastinal structures from right to left. Please correlate with clinical history. Electronically Signed   By: Karen Kays M.D.   On: 03/03/2023 18:35    Procedures Procedures    Medications Ordered in ED Medications  ipratropium-albuterol (DUONEB) 0.5-2.5 (3) MG/3ML nebulizer solution 3 mL (3 mLs Nebulization Given 03/03/23 1949)  magnesium sulfate IVPB 2 g 50 mL (0 g Intravenous Stopped 03/03/23 2048)  ipratropium-albuterol (DUONEB) 0.5-2.5 (3) MG/3ML nebulizer solution 3 mL (3 mLs Nebulization Given 03/03/23 2105)    ED Course/ Medical Decision Making/ A&P                             Medical Decision Making Amount and/or Complexity of Data Reviewed Labs: ordered. Radiology: ordered.  Risk Prescription drug management.   Tracy Park 67 y.o. presented today for shortness of breath.  Working DDx that I considered at this time includes, but not limited to, asthma/COPD exacerbation, URI, viral illness, anemia, ACS, PE, pneumonia, pleural effusion, lung cancer.  R/o DDx: asthma/COPD exacerbation, URI, viral illness, anemia, ACS, PE, pneumonia, pleural effusion: These are considered less likely due to history of  present illness and physical exam findings  Review of prior external notes: 02/23/2023 discharge summary  Unique Tests and My Interpretation:  CBC: Unremarkable BMP: Unremarkable D-dimer: 0.52, negative when age-adjusted BNP: Negative EKG: Sinus tachycardia 111 bpm, left anterior fascicular block, no ST abnormalities noted Troponin: 8 CXR: Stable volume loss and left hemothorax, left apical opacity with tracheal shift away; these findings have been seen previously  Discussion with Independent Historian: None  Discussion of Management of Tests: None  Risk: Medium: prescription drug management  Risk Stratification Score:  None   Plan: On exam patient was in no acute distress and had stable vitals.  Patient is on 2 L nasal cannula which is her baseline.  Patient's lungs did have wheezing noted and so a breathing treatment was ordered.  Patient has allergy to steroids and so all a DuoNeb and magnesium were ordered.  Labs will be ordered and patient be monitored.  Patient stable this time.  Patient's labs came back reassuring.  Patient's D-dimer was slightly elevated however when age-adjusted was negative and so CTA will not be ordered at this time.  On recheck patient stated that she felt much better after the initial breathing treatment however when auscultating her lungs patient was still having expiratory wheezing bilaterally.  Another DuoNeb was ordered.  Patient stable this time.  On recheck patient's breathing had improved along with her wheezing.  I spoke to the patient and we had a shared decision-making conversation about being discharged patient's primary care provider appointment tomorrow.  Patient does not appear to have any sputum production and at this time I have low suspicion for COPD exacerbation as patient is currently at her baseline of 2 L nasal cannula.  Patient's vitals have been stable for the past 4 and half hours in the ED and at this time I have a low suspicion for  any life-threatening diagnosis.  With close follow-up tomorrow I feel patient is stable for discharge and patient verbalized her agreement with me.  Patient was given return precautions. Patient stable for discharge at this time.  Patient verbalized understanding of plan.         Final Clinical Impression(s) / ED Diagnoses Final diagnoses:  Chest pain, unspecified type    Rx / DC Orders  ED Discharge Orders     None         Remi Deter 03/03/23 2229    Sloan Leiter, DO 03/04/23 [id number]

## 2023-03-03 NOTE — ED Triage Notes (Signed)
BIBA from home for chest pain. Tight pressure that radiates to right and to her back for 2 hours. 5/10 pain, No aspirin given due to allergy, 3 nitroglycerin PTA with no relief. Hx of COPD, CHF on home O2 at 2 lpm 112/70 BP 110 HR 18g right forearm

## 2023-03-04 NOTE — ED Notes (Signed)
Transport has arrived for pt, pt a/o x4 RR even and unlabored , [facility] taking pt home, al belongings sent with pt

## 2023-03-06 ENCOUNTER — Inpatient Hospital Stay (HOSPITAL_COMMUNITY)
Admission: EM | Admit: 2023-03-06 | Discharge: 2023-03-10 | DRG: 191 | Disposition: A | Discharge status: Home / Self Care | Payer: 59 | Attending: Family Medicine | Admitting: Family Medicine

## 2023-03-06 ENCOUNTER — Other Ambulatory Visit: Payer: Self-pay

## 2023-03-06 ENCOUNTER — Encounter (HOSPITAL_COMMUNITY): Payer: Self-pay | Admitting: Emergency Medicine

## 2023-03-06 ENCOUNTER — Emergency Department (HOSPITAL_COMMUNITY): Payer: 59

## 2023-03-06 DIAGNOSIS — K219 Gastro-esophageal reflux disease without esophagitis: Secondary | ICD-10-CM | POA: Diagnosis present

## 2023-03-06 DIAGNOSIS — Z7901 Long term (current) use of anticoagulants: Secondary | ICD-10-CM

## 2023-03-06 DIAGNOSIS — F3181 Bipolar II disorder: Secondary | ICD-10-CM | POA: Diagnosis present

## 2023-03-06 DIAGNOSIS — Z79890 Hormone replacement therapy (postmenopausal): Secondary | ICD-10-CM | POA: Diagnosis not present

## 2023-03-06 DIAGNOSIS — I252 Old myocardial infarction: Secondary | ICD-10-CM

## 2023-03-06 DIAGNOSIS — D638 Anemia in other chronic diseases classified elsewhere: Secondary | ICD-10-CM | POA: Diagnosis present

## 2023-03-06 DIAGNOSIS — E876 Hypokalemia: Secondary | ICD-10-CM | POA: Diagnosis present

## 2023-03-06 DIAGNOSIS — I480 Paroxysmal atrial fibrillation: Secondary | ICD-10-CM | POA: Diagnosis present

## 2023-03-06 DIAGNOSIS — Z90710 Acquired absence of both cervix and uterus: Secondary | ICD-10-CM

## 2023-03-06 DIAGNOSIS — Z720 Tobacco use: Secondary | ICD-10-CM | POA: Diagnosis present

## 2023-03-06 DIAGNOSIS — Z66 Do not resuscitate: Secondary | ICD-10-CM | POA: Diagnosis present

## 2023-03-06 DIAGNOSIS — E890 Postprocedural hypothyroidism: Secondary | ICD-10-CM | POA: Diagnosis present

## 2023-03-06 DIAGNOSIS — J9611 Chronic respiratory failure with hypoxia: Secondary | ICD-10-CM | POA: Diagnosis present

## 2023-03-06 DIAGNOSIS — I10 Essential (primary) hypertension: Secondary | ICD-10-CM | POA: Diagnosis present

## 2023-03-06 DIAGNOSIS — F411 Generalized anxiety disorder: Secondary | ICD-10-CM | POA: Diagnosis present

## 2023-03-06 DIAGNOSIS — Z886 Allergy status to analgesic agent status: Secondary | ICD-10-CM

## 2023-03-06 DIAGNOSIS — F17210 Nicotine dependence, cigarettes, uncomplicated: Secondary | ICD-10-CM | POA: Diagnosis present

## 2023-03-06 DIAGNOSIS — Z85118 Personal history of other malignant neoplasm of bronchus and lung: Secondary | ICD-10-CM | POA: Diagnosis not present

## 2023-03-06 DIAGNOSIS — Z9981 Dependence on supplemental oxygen: Secondary | ICD-10-CM

## 2023-03-06 DIAGNOSIS — J441 Chronic obstructive pulmonary disease with (acute) exacerbation: Principal | ICD-10-CM | POA: Diagnosis present

## 2023-03-06 DIAGNOSIS — Z79899 Other long term (current) drug therapy: Secondary | ICD-10-CM

## 2023-03-06 DIAGNOSIS — E785 Hyperlipidemia, unspecified: Secondary | ICD-10-CM | POA: Diagnosis present

## 2023-03-06 DIAGNOSIS — Z888 Allergy status to other drugs, medicaments and biological substances status: Secondary | ICD-10-CM

## 2023-03-06 DIAGNOSIS — Z902 Acquired absence of lung [part of]: Secondary | ICD-10-CM

## 2023-03-06 DIAGNOSIS — J439 Emphysema, unspecified: Secondary | ICD-10-CM | POA: Diagnosis present

## 2023-03-06 DIAGNOSIS — F410 Panic disorder [episodic paroxysmal anxiety] without agoraphobia: Secondary | ICD-10-CM | POA: Diagnosis present

## 2023-03-06 DIAGNOSIS — Z87442 Personal history of urinary calculi: Secondary | ICD-10-CM

## 2023-03-06 DIAGNOSIS — E782 Mixed hyperlipidemia: Secondary | ICD-10-CM | POA: Diagnosis not present

## 2023-03-06 DIAGNOSIS — F32A Depression, unspecified: Secondary | ICD-10-CM | POA: Diagnosis present

## 2023-03-06 DIAGNOSIS — E039 Hypothyroidism, unspecified: Secondary | ICD-10-CM | POA: Diagnosis not present

## 2023-03-06 DIAGNOSIS — Z885 Allergy status to narcotic agent status: Secondary | ICD-10-CM

## 2023-03-06 DIAGNOSIS — R0602 Shortness of breath: Secondary | ICD-10-CM

## 2023-03-06 DIAGNOSIS — J9621 Acute and chronic respiratory failure with hypoxia: Secondary | ICD-10-CM | POA: Diagnosis present

## 2023-03-06 DIAGNOSIS — R079 Chest pain, unspecified: Secondary | ICD-10-CM | POA: Diagnosis present

## 2023-03-06 HISTORY — DX: Paroxysmal atrial fibrillation: I48.0

## 2023-03-06 LAB — CBC WITH DIFFERENTIAL/PLATELET
Abs Immature Granulocytes: 0.05 10*3/uL (ref 0.00–0.07)
Basophils Absolute: 0 10*3/uL (ref 0.0–0.1)
Basophils Relative: 0 %
Eosinophils Absolute: 0.1 10*3/uL (ref 0.0–0.5)
Eosinophils Relative: 1 %
HCT: 35.4 % — ABNORMAL LOW (ref 36.0–46.0)
Hemoglobin: 10.5 g/dL — ABNORMAL LOW (ref 12.0–15.0)
Immature Granulocytes: 1 %
Lymphocytes Relative: 27 %
Lymphs Abs: 2.7 10*3/uL (ref 0.7–4.0)
MCH: 28.1 pg (ref 26.0–34.0)
MCHC: 29.7 g/dL — ABNORMAL LOW (ref 30.0–36.0)
MCV: 94.7 fL (ref 80.0–100.0)
Monocytes Absolute: 0.7 10*3/uL (ref 0.1–1.0)
Monocytes Relative: 7 %
Neutro Abs: 6.3 10*3/uL (ref 1.7–7.7)
Neutrophils Relative %: 64 %
Platelets: 212 10*3/uL (ref 150–400)
RBC: 3.74 MIL/uL — ABNORMAL LOW (ref 3.87–5.11)
RDW: 13.2 % (ref 11.5–15.5)
WBC: 9.9 10*3/uL (ref 4.0–10.5)
nRBC: 0 % (ref 0.0–0.2)

## 2023-03-06 LAB — BASIC METABOLIC PANEL
Anion gap: 5 (ref 5–15)
BUN: 14 mg/dL (ref 8–23)
CO2: 23 mmol/L (ref 22–32)
Calcium: 6.4 mg/dL — CL (ref 8.9–10.3)
Chloride: 114 mmol/L — ABNORMAL HIGH (ref 98–111)
Creatinine, Ser: 0.4 mg/dL — ABNORMAL LOW (ref 0.44–1.00)
GFR, Estimated: >60 mL/min (ref >60)
Glucose, Bld: 84 mg/dL (ref 70–99)
Potassium: 2.7 mmol/L — CL (ref 3.5–5.1)
Sodium: 142 mmol/L (ref 135–145)

## 2023-03-06 LAB — BRAIN NATRIURETIC PEPTIDE: B Natriuretic Peptide: 54.6 pg/mL (ref 0.0–100.0)

## 2023-03-06 LAB — MAGNESIUM: Magnesium: 1.8 mg/dL (ref 1.7–2.4)

## 2023-03-06 MED ORDER — IPRATROPIUM BROMIDE 0.02 % IN SOLN
0.5000 mg | Freq: Four times a day (QID) | RESPIRATORY_TRACT | Status: DC
  Administered 2023-03-07 (×3): 0.5 mg via RESPIRATORY_TRACT
  Filled 2023-03-06 (×5): qty 2.5

## 2023-03-06 MED ORDER — ONDANSETRON HCL 4 MG/2ML IJ SOLN: 4.0000 mg | Freq: Four times a day (QID) | INTRAMUSCULAR | Status: DC | PRN

## 2023-03-06 MED ORDER — SODIUM CHLORIDE 0.9 % IV SOLN
100.0000 mg | Freq: Two times a day (BID) | INTRAVENOUS | Status: DC
  Administered 2023-03-07 – 2023-03-10 (×7): 100 mg via INTRAVENOUS
  Filled 2023-03-06 (×9): qty 100

## 2023-03-06 MED ORDER — METOPROLOL SUCCINATE ER 25 MG PO TB24
12.5000 mg | ORAL_TABLET | Freq: Every day | ORAL | Status: DC
  Administered 2023-03-07 – 2023-03-10 (×4): 12.5 mg via ORAL
  Filled 2023-03-06 (×4): qty 1

## 2023-03-06 MED ORDER — METHYLPREDNISOLONE SODIUM SUCC 125 MG IJ SOLR
80.0000 mg | Freq: Two times a day (BID) | INTRAMUSCULAR | Status: DC
  Administered 2023-03-07 – 2023-03-10 (×7): 80 mg via INTRAVENOUS
  Filled 2023-03-06 (×8): qty 2

## 2023-03-06 MED ORDER — POTASSIUM CHLORIDE CRYS ER 20 MEQ PO TBCR
40.0000 meq | EXTENDED_RELEASE_TABLET | Freq: Once | ORAL | Status: AC
  Administered 2023-03-07: 40 meq via ORAL
  Filled 2023-03-06: qty 2

## 2023-03-06 MED ORDER — MELATONIN 3 MG PO TABS: 3.0000 mg | ORAL_TABLET | Freq: Every evening | ORAL | Status: DC | PRN

## 2023-03-06 MED ORDER — LEVALBUTEROL HCL 1.25 MG/0.5ML IN NEBU: 1.2500 mg | INHALATION_SOLUTION | RESPIRATORY_TRACT | Status: DC | PRN

## 2023-03-06 MED ORDER — ALPRAZOLAM 0.5 MG PO TABS
0.5000 mg | ORAL_TABLET | Freq: Once | ORAL | Status: AC
  Administered 2023-03-06: 0.5 mg via ORAL
  Filled 2023-03-06: qty 1

## 2023-03-06 MED ORDER — POTASSIUM CHLORIDE 10 MEQ/100ML IV SOLN
10.0000 meq | INTRAVENOUS | Status: AC
  Administered 2023-03-06 (×2): 10 meq via INTRAVENOUS
  Filled 2023-03-06 (×2): qty 100

## 2023-03-06 MED ORDER — POTASSIUM CHLORIDE 20 MEQ PO PACK
40.0000 meq | PACK | Freq: Once | ORAL | Status: AC
  Administered 2023-03-06: 40 meq via ORAL
  Filled 2023-03-06: qty 2

## 2023-03-06 MED ORDER — LEVALBUTEROL HCL 1.25 MG/0.5ML IN NEBU
1.2500 mg | INHALATION_SOLUTION | Freq: Four times a day (QID) | RESPIRATORY_TRACT | Status: DC
  Administered 2023-03-07 (×2): 1.25 mg via RESPIRATORY_TRACT
  Filled 2023-03-06 (×3): qty 0.5

## 2023-03-06 MED ORDER — ACETAMINOPHEN 650 MG RE SUPP: 650.0000 mg | Freq: Four times a day (QID) | RECTAL | Status: DC | PRN

## 2023-03-06 MED ORDER — ACETAMINOPHEN 325 MG PO TABS
650.0000 mg | ORAL_TABLET | Freq: Four times a day (QID) | ORAL | Status: DC | PRN
  Administered 2023-03-07: 650 mg via ORAL
  Filled 2023-03-06: qty 2

## 2023-03-06 MED ORDER — LEVOTHYROXINE SODIUM 25 MCG PO TABS
125.0000 ug | ORAL_TABLET | Freq: Every day | ORAL | Status: DC
  Administered 2023-03-07 – 2023-03-10 (×4): 125 ug via ORAL
  Filled 2023-03-06 (×4): qty 1

## 2023-03-06 MED ORDER — APIXABAN 5 MG PO TABS
5.0000 mg | ORAL_TABLET | Freq: Two times a day (BID) | ORAL | Status: DC
  Administered 2023-03-07 – 2023-03-10 (×8): 5 mg via ORAL
  Filled 2023-03-06 (×8): qty 1

## 2023-03-06 MED ORDER — ROSUVASTATIN CALCIUM 20 MG PO TABS
20.0000 mg | ORAL_TABLET | Freq: Every day | ORAL | Status: DC
  Administered 2023-03-07 – 2023-03-10 (×4): 20 mg via ORAL
  Filled 2023-03-06 (×4): qty 1

## 2023-03-06 MED ORDER — NICOTINE 14 MG/24HR TD PT24: 14.0000 mg | MEDICATED_PATCH | Freq: Every day | TRANSDERMAL | Status: DC | PRN

## 2023-03-06 MED ORDER — ALPRAZOLAM 0.5 MG PO TABS: 0.5000 mg | ORAL_TABLET | Freq: Three times a day (TID) | ORAL | Status: DC | PRN

## 2023-03-06 NOTE — ED Triage Notes (Signed)
Patient BIB EMS from home c/o SOB and anxiety. Per report patient worsening anxiety tonight that cause her SOB. Patient report she's out of anti anxiety medication. Pt a/ox4 . Patient denies N/V.[name] Pt on 2L at home.

## 2023-03-06 NOTE — ED Provider Notes (Signed)
[place] EMERGENCY DEPARTMENT AT [facility] Provider Note   CSN: [id number] Arrival date & time: 03/06/23  1835     History COPD Chief Complaint  Patient presents with   Shortness of Breath    Tracy Park is a 67 y.o. female.  67 y.o female with a PMH of COPD on 2LNC presents to the ED with a chief complaint of shortness of breath along with panic attack.  Patient reports she was making food for her daughter Jill Alexanders, when suddenly she felt very short of breath, felt that she could not control her symptoms.  She began to feel worse, was rambling words to her daughter.  She did call her PCPs office this morning in order to get a refill for her alprazolam 0.5 3 times daily, reports it did not get sent to the pharmacy.  She has not had any increase in her albuterol inhaler, no increase in breathing treatments.  No leg swelling, no cough, no chest pain, no fevers.  The history is provided by the patient and medical records.  Shortness of Breath Associated symptoms: no abdominal pain, no chest pain, no fever, no headaches, no sore throat and no vomiting        Home Medications Prior to Admission medications   Medication Sig Start Date End Date Taking? Authorizing Provider  albuterol (PROVENTIL) (2.5 MG/3ML) 0.083% nebulizer solution Take 3 mLs (2.5 mg total) by nebulization every 6 (six) hours as needed for wheezing or shortness of breath. Patient taking differently: Take 2.5 mg by nebulization See admin instructions. Nebulize 2.5 mg and inhale into the lungs in the morning and at bedtime 01/30/23   Tilden Fossa, MD  albuterol (VENTOLIN HFA) 108 (90 Base) MCG/ACT inhaler Inhale 2 puffs into the lungs every 6 (six) hours as needed for wheezing or shortness of breath. 01/13/23   Burnadette Pop, MD  ALPRAZolam Prudy Feeler) 0.5 MG tablet Take 1 tablet (0.5 mg total) by mouth 3 (three) times daily as needed for anxiety. 02/26/23   Meredeth Ide, MD  amoxicillin-clavulanate  (AUGMENTIN) 875-125 MG tablet Take 1 tablet by mouth 2 (two) times daily. 02/26/23   Meredeth Ide, MD  apixaban (ELIQUIS) 5 MG TABS tablet Take 1 tablet (5 mg total) by mouth 2 (two) times daily. 01/28/23 02/27/23  Jerald Kief, MD  Budeson-Glycopyrrol-Formoterol (BREZTRI AEROSPHERE) 160-9-4.8 MCG/ACT AERO Inhale 1 puff into the lungs in the morning and at bedtime.    [provider]  levothyroxine (SYNTHROID) 125 MCG tablet Take 125 mcg by mouth daily before breakfast.    [provider]  losartan (COZAAR) 25 MG tablet Take 12.5 mg by mouth in the morning.    [provider]  Magnesium Oxide 400 MG CAPS Take 1 capsule (400 mg total) by mouth daily at 12 noon. Patient taking differently: Take 400 mg by mouth in the morning. 03/01/22   Albertine Grates, MD  metoprolol succinate (TOPROL-XL) 25 MG 24 hr tablet Take 0.5 tablets (12.5 mg total) by mouth daily. 01/29/23 02/28/23  Jerald Kief, MD  nitroGLYCERIN (NITROSTAT) 0.4 MG SL tablet Place 0.4 mg under the tongue every 5 (five) minutes as needed for chest pain.    [provider]  OXYGEN Inhale 2 L/min into the lungs continuous.    [provider]  pantoprazole (PROTONIX) 40 MG tablet Take 40 mg by mouth daily before breakfast.    [provider]  rosuvastatin (CRESTOR) 20 MG tablet Take 20 mg by mouth daily.  01/15/20   [provider]  SYSTANE COMPLETE PF 0.6 % SOLN Place 1 drop into both eyes 3 (three) times daily as needed (for dryness).    [provider]  tamsulosin (FLOMAX) 0.4 MG CAPS capsule Take 0.4 mg by mouth in the morning. 03/27/22   [provider]      Allergies    Red dye, Oxycontin [oxycodone hcl], Prednisone, Aspirin, Strawberry extract, Tomato, Tape, and Wound dressing adhesive    Review of Systems   Review of Systems  Constitutional:  Negative for chills and fever.  HENT:  Negative for sore throat.   Respiratory:  Positive for shortness of breath.    Cardiovascular:  Negative for chest pain.  Gastrointestinal:  Negative for abdominal pain, nausea and vomiting.  Genitourinary:  Negative for flank pain.  Musculoskeletal:  Negative for back pain.  Neurological:  Negative for headaches.  All other systems reviewed and are negative.   Physical Exam Updated Vital Signs BP 107/63   Pulse 82   Temp 98.2 F (36.8 C) (Oral)   Resp (!) 26   SpO2 95%  Physical Exam Vitals and nursing note reviewed.  Constitutional:      Appearance: She is well-developed. She is ill-appearing.     Comments: Chronically ill appearing.   HENT:     Head: Normocephalic and atraumatic.  Cardiovascular:     Rate and Rhythm: Normal rate.     Pulses:          Dorsalis pedis pulses are 2+ on the right side and 2+ on the left side.  Pulmonary:     Breath sounds: Examination of the left-lower field reveals wheezing. Wheezing present.  Chest:     Chest wall: No tenderness.  Abdominal:     Palpations: Abdomen is soft.  Musculoskeletal:     Cervical back: Normal range of motion and neck supple.     Right lower leg: No edema.     Left lower leg: No edema.  Skin:    General: Skin is warm and dry.  Neurological:     Mental Status: She is alert and oriented to person, place, and time.     ED Results / Procedures / Treatments   Labs (all labs ordered are listed, but only abnormal results are displayed) Labs Reviewed  BASIC METABOLIC PANEL - Abnormal; Notable for the following components:      Result Value   Potassium 2.7 (*)    Chloride 114 (*)    Creatinine, Ser 0.40 (*)    Calcium 6.4 (*)    All other components within normal limits  CBC WITH DIFFERENTIAL/PLATELET - Abnormal; Notable for the following components:   RBC 3.74 (*)    Hemoglobin 10.5 (*)    HCT 35.4 (*)    MCHC 29.7 (*)    All other components within normal limits  BRAIN NATRIURETIC PEPTIDE    EKG EKG Interpretation Date/Time:  Thursday March 06 2023 18:42:50 EDT Ventricular  Rate:  108 PR Interval:  164 QRS Duration:  99 QT Interval:  342 QTC Calculation: 459 R Axis:   -72  Text Interpretation: Sinus tachycardia Right atrial enlargement Left anterior fascicular block Consider right ventricular hypertrophy Left ventricular hypertrophy since last tracing no significant change Confirmed by Rolan Bucco [phone]) on 03/06/2023 7:13:38 PM  Radiology DG Chest 2 View  Result Date: 03/06/2023 CLINICAL DATA:  Shortness of breath. EXAM: CHEST - 2 VIEW COMPARISON:  CT chest dated Jan 25, 2019 and chest radiograph dated  March 03, 2019 FINDINGS: The heart size and mediastinal contours are within normal limits. Hyperinflated lungs suggesting moderate emphysema. Chronic left lung volume loss, apical atelectasis and pleural/parenchymal scarring, unchanged. No pleural effusion or pneumothorax. No acute osseous abnormality. IMPRESSION: 1. No active cardiopulmonary disease. 2. Chronic left lung volume loss, apical atelectasis and pleural/parenchymal scarring. COPD Electronically Signed   By: Larose Hires D.O.   On: 03/06/2023 19:56    Procedures Procedures    Medications Ordered in ED Medications  potassium chloride 10 mEq in 100 mL IVPB (10 mEq Intravenous New Bag/Given 03/06/23 2155)  ALPRAZolam Prudy Feeler) tablet 0.5 mg (0.5 mg Oral Given 03/06/23 2045)  potassium chloride (KLOR-CON) packet 40 mEq (40 mEq Oral Given 03/06/23 2048)    ED Course/ Medical Decision Making/ A&P                             Medical Decision Making Amount and/or Complexity of Data Reviewed Labs: ordered. Radiology: ordered.  Risk Prescription drug management.    This patient presents to the ED for concern of shortness of breath, panic attack, this involves a number of treatment options, and is a complaint that carries with it a high risk of complications and morbidity.  The differential diagnosis includes COPD exacerbation, panic attack, electrolyte derangement.   Co morbidities: Discussed in  HPI   Brief History:  See HPI.   EMR reviewed including pt PMHx, past surgical history and past visits to ER.   See HPI for more details   Lab Tests:  I ordered and independently interpreted labs.  The pertinent results include:    Labs notable for CBC with no leukocytosis, hemoglobin is slightly decreased.  No signs of rectal bleeding or any origin of blood.  BMP with hypokalemia with a potassium of 2.7, will give IV along with oral replacement.  Creatinine level slightly decreased.  Calcium is decreased on today's visit.  BNP was also added.   Imaging Studies:  NAD. I personally reviewed all imaging studies and no acute abnormality found. I agree with radiology interpretation.  Cardiac Monitoring:  The patient was maintained on a cardiac monitor.  I personally viewed and interpreted the cardiac monitored which showed an underlying rhythm of: Sinus tachycardia EKG non-ischemic   Medicines ordered:  I ordered medication including potassium  for symptomatic treatment Reevaluation of the patient after these medicines showed that the patient stayed the same I have reviewed the patients home medicines and have made adjustments as needed  Reevaluation:  After the interventions noted above I re-evaluated patient and found that they have :improved  Social Determinants of Health:  The patient's social determinants of health were a factor in the care of this patient   Problem List / ED Course:  Patient presents to the ED with a chief complaint of sudden onset of panic attack when she was at home, felt short of breath, was evaluated in the emergency department several days ago and treated for COPD exacerbation.  Patient noted this may be anxiety, however she has been feeling weak at home over the last couple of days.  Reports she was in the kitchen making food when suddenly her symptoms began.  She did try to call her PCP today in order to get some more proximally and to help with  her anxiety, reports this prescription was not called in. On evaluation patient appears chronically ill, she is not having any fevers but does have a  cough at baseline.  She is on 2 L nasal cannula at baseline as well.  She does have a history of lung cancer currently in remission with a partial lobectomy to the left. Labs obtained on today's visit reveal a CBC with no leukocytosis, hemoglobin is stable.  BMP remarkable for some hypokalemia at 2.7, this was IV along with bolus for emergency.  Calcium is low on todays visit. BNP is normal, does not appear volume overloaded on exam. Chest xray without any acute findings, no infection noted. Patient with significant medical history, had a high risk of decompensation based on her prior history, I do feel that she needs admission for her hypocalcemia, hypokalemia. Call placed to hospitalist.  Spoke to Dr. Arlean Hopping who agrees on admission at this time.  Patient remains hemodynamically stable for admission.   Dispostion:  After consideration of the diagnostic results and the patients response to treatment, I feel that the patent would benefit from admission for further treatment.    Portions of this note were generated with Scientist, clinical (histocompatibility and immunogenetics). Dictation errors may occur despite best attempts at proofreading.   Final Clinical Impression(s) / ED Diagnoses Final diagnoses:  Panic attack  SOB (shortness of breath)  Hypokalemia  Hypocalcemia    Rx / DC Orders ED Discharge Orders     None         Claude Manges, PA-C 03/06/23 2243    Rolan Bucco, MD 03/06/23 2258

## 2023-03-06 NOTE — H&P (Signed)
History and Physical      Tracy Park GLO:[id number] DOB: 06-Aug-1956 DOA: 03/06/2023; DOS: 03/06/2023  PCP: Jacquelin Hawking, PA-C  Patient coming from: home   I have personally briefly reviewed patient's old medical records in [facility]  Chief Complaint: Shortness of breath  HPI: Tracy Park is a 67 y.o. female with medical history significant for lung cancer status post left upper lobectomy, chronic hypoxic respiratory failure on continuous 2 L nasal cannula, COPD, send hypertension, hyperlipidemia, generalized anxiety disorder, paroxysmal atrial fibrillation chronically anticoagulated on Eliquis, who is admitted to [facility] on 03/06/2023 with acute COPD exacerbation after presenting from home to [facility] ED complaining of shortness of breath.   The patient reports 1 to 2 days of progressive shortness of breath associated with wheezing.  She notes that her shortness of breath is not associate with any orthopnea, PND, or worsening peripheral edema.  Denies any new cough or any hemoptysis.  No assist with any subjective fever, chills, rigors, or generalized myalgias.  No recent chest pain, palpitations, diaphoresis, nausea, vomiting, dizziness, presyncope, or syncope.  No recent preceding trauma.  No recent rhinitis, rhinorrhea.  She knowledges a history of lung cancer status post left upper lobectomy as well as a history of COPD, notes chronic supplemental oxygen requirement in the form of continuous 2 L nasal cannula.  She was recently hospitalized in the [facility] health system for acute COPD exacerbation from 02/23/2023 to 02/26/2023.  She was subsequently discharged to home on a course of Augmentin regarding concern for potential inciting underlying community-acquired pneumonia.  She notes that she compliant be completed this course of Augmentin, and denies any associated/ensuing loose stool.  She notes that her shortness of breath over the last 1 to 2 days has worsened and  spite of increased frequency of use of her outpatient prn albuterol inhaler.  Reports good compliance with her scheduled Breztri.  She has a documented history of allergic reaction to prednisone, noting that she has previously experienced hives in response to this medication, but is tolerated solumedrol during hospitalizations for acute COPD exacerbation.     ED Course:  Vital signs in the ED were notable for the following: Afebrile; rates in the 80s to low 100s; still blood pressures in the low 100s 1 teens; respiratory rate 18-26, oxygen saturation 95% on her baseline 2 L nasal cannula.  Labs were notable for the following: BMP was notable for sodium 142, potassium 2.7, bicarbonate 23, creatinine 0.40, glucose 84, calcium 6.4, in the absence of concomitant liver enzymes, including the absence of albumin level.  BNP 54.6 compared to most recent prior BNP data point of 76.4 on 03/03/2023.  CBC notable for white blood count 9900 compared to most recent prior will with cell count of 12,200 on 03/03/2023, hemoglobin 10.5 associated with normocytic results, and relative to most recent prior hemoglobin value of 11.9 on 03/03/2023.  Per my interpretation, EKG in ED demonstrated the following: Sinus tachycardia with left anterior fascicular block, heart rate 100, and no evidence of T wave ST changes, including no evidence of ST elevation.  Imaging and additional notable ED work-up: 2 view chest x-ray, performed radiology read, shows no evidence of acute cardiopulmonary process, including no evidence of infiltrate, edema, effusion, or pneumothorax.  While in the ED, the following were administered: Xanax 0.5 mg p.o. x 1, potassium chloride 40 mill colons p.o. x 1 dose, potassium chloride 20 mill colons IV over 2 hours.  Subsequently, the patient  was admitted for further evaluation management of presenting acute COPD exacerbation, with laboratory abnormalities that include hypokalemia as well as suspected  hypocalcemia.    Review of Systems: As per HPI otherwise 10 point review of systems negative.   Past Medical History:  Diagnosis Date   Anginal pain (HCC)    Anxiety    Bipolar disorder (HCC)    Cancer (HCC)    COPD (chronic obstructive pulmonary disease) (HCC)    Dyspnea    Family history of adverse reaction to anesthesia    History of kidney stones    Hydroureteronephrosis 08/16/2021   Hypothyroidism    Lung cancer (HCC)    Myocardial infarction (HCC)    Paroxysmal atrial fibrillation (HCC)    PTSD (post-traumatic stress disorder)    Sleep apnea    Thyroid disease     Past Surgical History:  Procedure Laterality Date   ABDOMINAL HYSTERECTOMY     BACK SURGERY     CYSTOSCOPY W/ URETERAL STENT PLACEMENT Right 05/03/2021   Procedure: CYSTOSCOPY WITH RETROGRADE PYELOGRAM/URETERAL STENT PLACEMENT;  Surgeon: Crist Fat, MD;  Location: [facility]S;  Service: Urology;  Laterality: Right;   EYE SURGERY     kidney stent     thyroidectomy      Social History:  reports that she has been smoking cigarettes. She has been smoking an average of .15 packs per day. She has never used smokeless tobacco. She reports that she does not currently use alcohol. She reports that she does not currently use drugs.   Allergies  Allergen Reactions   Red Dye Hives, Itching and Other (See Comments)    Red food dye   Oxycontin [Oxycodone Hcl] Other (See Comments)    Hallucinations    Prednisone Hives and Nausea And Vomiting   Aspirin Hives   Strawberry Extract Hives and Itching   Tomato Hives and Itching   Tape Rash and Other (See Comments)    Prefers paper tape   Wound Dressing Adhesive Rash    Family History  Family history unknown: Yes    Family history reviewed and not pertinent    Prior to Admission medications   Medication Sig Start Date End Date Taking? Authorizing Provider  albuterol (PROVENTIL) (2.5 MG/3ML) 0.083% nebulizer solution Take 3 mLs (2.5 mg total) by  nebulization every 6 (six) hours as needed for wheezing or shortness of breath. Patient taking differently: Take 2.5 mg by nebulization See admin instructions. Nebulize 2.5 mg and inhale into the lungs in the morning and at bedtime 01/30/23  Yes Tilden Fossa, MD  albuterol (VENTOLIN HFA) 108 (90 Base) MCG/ACT inhaler Inhale 2 puffs into the lungs every 6 (six) hours as needed for wheezing or shortness of breath. 01/13/23  Yes Burnadette Pop, MD  ALPRAZolam Prudy Feeler) 0.5 MG tablet Take 1 tablet (0.5 mg total) by mouth 3 (three) times daily as needed for anxiety. 02/26/23  Yes Sharl Ma, Sarina Ill, MD  apixaban (ELIQUIS) 5 MG TABS tablet Take 1 tablet (5 mg total) by mouth 2 (two) times daily. 01/28/23 03/06/23 Yes Jerald Kief, MD  Budeson-Glycopyrrol-Formoterol (BREZTRI AEROSPHERE) 160-9-4.8 MCG/ACT AERO Inhale 1 puff into the lungs in the morning and at bedtime.   Yes [provider]  levothyroxine (SYNTHROID) 125 MCG tablet Take 125 mcg by mouth daily before breakfast.   Yes [provider]  losartan (COZAAR) 25 MG tablet Take 12.5 mg by mouth in the morning.   Yes [provider]  Magnesium Oxide 400 MG CAPS Take  1 capsule (400 mg total) by mouth daily at 12 noon. Patient taking differently: Take 400 mg by mouth in the morning. 03/01/22  Yes Albertine Grates, MD  metoprolol succinate (TOPROL-XL) 25 MG 24 hr tablet Take 0.5 tablets (12.5 mg total) by mouth daily. 01/29/23 03/06/23 Yes Jerald Kief, MD  nitroGLYCERIN (NITROSTAT) 0.4 MG SL tablet Place 0.4 mg under the tongue every 5 (five) minutes as needed for chest pain.   Yes [provider]  pantoprazole (PROTONIX) 40 MG tablet Take 40 mg by mouth daily before breakfast.   Yes [provider]  rosuvastatin (CRESTOR) 20 MG tablet Take 20 mg by mouth daily. 01/15/20  Yes [provider]  SYSTANE COMPLETE PF 0.6 % SOLN Place 1 drop into both eyes 3 (three) times daily as needed (for dryness).   Yes [provider]  tamsulosin (FLOMAX) 0.4 MG CAPS capsule Take 0.4 mg by mouth in the morning. 03/27/22  Yes [provider]  amoxicillin-clavulanate (AUGMENTIN) 875-125 MG tablet Take 1 tablet by mouth 2 (two) times daily. Patient not taking: Reported on 03/06/2023 02/26/23   Meredeth Ide, MD  OXYGEN Inhale 2 L/min into the lungs continuous.    [provider]     Objective    Physical Exam: Vitals:   03/06/23 1845 03/06/23 2225  BP: 116/86 107/63  Pulse: (!) 104 82  Resp: 18 (!) 26  Temp: 98.1 F (36.7 C) 98.2 F (36.8 C)  TempSrc: Oral Oral  SpO2: 95% 95%    General: appears to be stated age; alert, oriented; mildly increased work of breathing noted Skin: warm, dry, no rash Head:  AT/[place] Mouth:  Oral mucosa membranes appear moist, normal dentition Neck: supple; trachea midline Heart: Mildly tachycardic, but regular; did not appreciate any M/R/G Lungs: Expiratory wheezes noted, left greater than right, but otherwise CTAB, did not appreciate any rales, or rhonchi Abdomen: + BS; soft, ND, NT Vascular: 2+ pedal pulses b/l; 2+ radial pulses b/l Extremities: no peripheral edema, no muscle wasting Neuro: strength and sensation intact in upper and lower extremities b/l     Labs on Admission: I have personally reviewed following labs and imaging studies  CBC: Recent Labs  Lab 03/03/23 1818 03/06/23 1938  WBC 12.2* 9.9  NEUTROABS  --  6.3  HGB 11.9* 10.5*  HCT 39.2 35.4*  MCV 92.9 94.7  PLT 312 212   Basic Metabolic Panel: Recent Labs  Lab 03/03/23 1818 03/06/23 1938  NA 141 142  K 3.6 2.7*  CL 101 114*  CO2 32 23  GLUCOSE 113* 84  BUN 11 14  CREATININE 0.58 0.40*  CALCIUM 9.1 6.4*   GFR: Estimated Creatinine Clearance: 53.7 mL/min (A) (by C-G formula based on SCr of 0.4 mg/dL (L)). Liver Function Tests: No results for input(s): "AST", "ALT", "ALKPHOS", "BILITOT", "PROT", "ALBUMIN" in the last 168 hours. No results for input(s): "LIPASE",  "AMYLASE" in the last 168 hours. No results for input(s): "AMMONIA" in the last 168 hours. Coagulation Profile: No results for input(s): "INR", "PROTIME" in the last 168 hours. Cardiac Enzymes: No results for input(s): "CKTOTAL", "CKMB", "CKMBINDEX", "TROPONINI" in the last 168 hours. BNP (last 3 results) No results for input(s): "PROBNP" in the last 8760 hours. HbA1C: No results for input(s): "HGBA1C" in the last 72 hours. CBG: No results for input(s): "GLUCAP" in the last 168 hours. Lipid Profile: No results for input(s): "CHOL", "HDL", "LDLCALC", "TRIG", "CHOLHDL", "LDLDIRECT" in the last 72 hours. Thyroid Function Tests: No  results for input(s): "TSH", "T4TOTAL", "FREET4", "T3FREE", "THYROIDAB" in the last 72 hours. Anemia Panel: No results for input(s): "VITAMINB12", "FOLATE", "FERRITIN", "TIBC", "IRON", "RETICCTPCT" in the last 72 hours. Urine analysis:    Component Value Date/Time   COLORURINE STRAW (A) 03/11/2022 1743   APPEARANCEUR CLEAR 03/11/2022 1743   LABSPEC 1.006 03/11/2022 1743   PHURINE 8.0 03/11/2022 1743   GLUCOSEU NEGATIVE 03/11/2022 1743   HGBUR NEGATIVE 03/11/2022 1743   BILIRUBINUR NEGATIVE 03/11/2022 1743   KETONESUR NEGATIVE 03/11/2022 1743   PROTEINUR NEGATIVE 03/11/2022 1743   UROBILINOGEN 0.2 11/06/2009 1559   NITRITE NEGATIVE 03/11/2022 1743   LEUKOCYTESUR NEGATIVE 03/11/2022 1743    Radiological Exams on Admission: DG Chest 2 View  Result Date: 03/06/2023 CLINICAL DATA:  Shortness of breath. EXAM: CHEST - 2 VIEW COMPARISON:  CT chest dated Jan 25, 2019 and chest radiograph dated March 03, 2019 FINDINGS: The heart size and mediastinal contours are within normal limits. Hyperinflated lungs suggesting moderate emphysema. Chronic left lung volume loss, apical atelectasis and pleural/parenchymal scarring, unchanged. No pleural effusion or pneumothorax. No acute osseous abnormality. IMPRESSION: 1. No active cardiopulmonary disease. 2. Chronic left lung  volume loss, apical atelectasis and pleural/parenchymal scarring. COPD Electronically Signed   By: Larose Hires D.O.   On: 03/06/2023 19:56      Assessment/Plan    Principal Problem:   Acute exacerbation of chronic obstructive pulmonary disease (COPD) (HCC) Active Problems:   Acquired hypothyroidism   Tobacco abuse   GERD (gastroesophageal reflux disease)   Hypokalemia   Chronic hypoxic respiratory failure (HCC)   Hyperlipidemia   Essential hypertension   Paroxysmal atrial fibrillation (HCC)   Hypocalcemia   GAD (generalized anxiety disorder)   Anemia of chronic disease     #) Acute COPD exacerbation: in the context of a documented history of COPD, diagnosis of acute exacerbation on the basis of 1 to 2 days of progressive shortness of breath associate with worsening wheezing, increased work of breathing, with presenting CXR showing no evidence of acute cardiopulmonary process, including no evidence of infiltrate, edema, effusion, or pneumothorax. Etiology of exac not entirely clear at this time. no clinical or radiographic evidence to suggest acutely decompensated heart failure at this time, also noting nonelevated BNP that is actually lower than that drawn 3 days ago.. Additionally, ACS appears less likely in the absence of chest pain, with EKG showing no evidence of acute ischemic changes. Clinically, acute PE also appears to be less likely at this time, with this possibility rendered even less likely in the context of the patient's reported compliance via chronic anticoagulation on Eliquis in the setting of paroxysmal atrial fibrillation. Will add-on procalcitonin to further eval for underlying pna. Outpatient respiratory regimen appears to include scheduled Breztri as well as prn albuterol inhaler, noting good compliance on his outpatient regimen.  She does continue to smoke, but has been able to reduce her daily cigarette use down to 1 to 2 cigarettes/day.  In the context of her  presenting mild tachycardia as well as her history of paroxysmal atrial fibrillation, will pursue Xopenex as preferred beta-2 agonist during this hospitalization.   Plan: monitor continuous pulse oxymetry. Monitor on telemetry. Solumedrol. Scheduled Xopenex/ipratropium nebulizers q6 hours. Prn Xopenex inhaler.  CMP in the morning. Repeat CBC in the morning. Check serum Mg and Phos levels. Will attempt additional chart review to evaluate most recent PFT results. Will start doxycycline for benefit of shortened duration of hospitalization associated with antibiotic initiation in the setting of acute  COPD exacerbation. Check blood gas. Add-on procalcitonin.  Holding home [place] for now.                  #) Hypokalemia: presenting potassium level noted to be 2.7.  Suspect contribution from intracellular shifting of serum potassium level as a pharmacologic consequence of recent increase in use of prn albuterol inhaler in the setting of presenting acute COPD exacerbation no overt evidence of contributory GI losses, which is notable in the context of recently completed course of Augmentin.  Status post receipt of 40 mEq of oral potassium chloride as well as 20 mEq of IV potassium chloride in the emergency department this evening  Plan: monitor on tele.  Will add KCl 40 meq x 1 dose. Add-on serum mag level. CMP, mag level in the AM.                 #) Hypocalcemia: Presentation suspected to be associated with low calcium, noting presenting serum calcium level 6.4, but in the absence of concomitant availability of albumin level for potential adjustment purposes.  In the absence of current albumin level to adjust serum calcium resolved, and in the absence of current serum magnesium level, will refrain from empiric supplementation with IV calcium gluconate, as this measure may result in decline in serum magnesium level, increasing risk for further exacerbation of COPD as a consequence of  resultant bronchospasm.   Plan: Add on serum magnesium level.  Check CMP.            #) Chronic hypoxic respiratory failure: Documented history of such, on 2 L continuous nasal cannula in the setting of left upper lobe ectomy as a result of history of lung cancer, with contribution from severe COPD.  Is maintaining oxygen saturations in the mid 90s on her baseline 2 L nasal cannula.  Plan: Monitor continuous pulse oximetry.  Further evaluation management of presenting acute COPD exacerbation.  Check serum magnesium and phosphorus levels.  Follow-up result of blood gas.                 #) Essential Hypertension: documented h/o such, with outpatient antihypertensive regimen including losartan, metoprolol succinate.  SBP's in the ED today: Low 100s to 1 teens mmHg. given borderline soft blood pressures, will hold home losartan for now.  Plan: Close monitoring of subsequent BP via routine VS. hold home losartan for now.  Resume home beta-blocker.                  #) Generalized anxiety disorder: documented h/o such. On prn Xanax as outpatient, in the absence of any scheduled SSRI or SNRI.  Plan: Continue outpatient prn Xanax.                 #) acquired hypothyroidism: documented h/o such, on Synthroid as outpatient.   Plan: cont home Synthroid.  Check TSH.                   #) Paroxysmal atrial fibrillation: Documented history of such. In setting of CHA2DS2-VASc score of 3, there is an indication for chronic anticoagulation for thromboembolic prophylaxis. Consistent with this, patient is chronically anticoagulated on Eliquis. Home AV nodal blocking regimen: Metoprolol succinate.  Most recent echocardiogram was performed on 01/26/2023, and was notable for LVEF 60 to 65%, no focal wall motion abnormalities, normal diastolic parameters, normal right ventricular systolic function, no evidence of significant valvular pathology.  Presenting EKG sinus tachycardia without overt evidence of acute ischemic changes.  Plan: monitor strict I's & O's and daily weights. CMP/CBC in AM. Check serum mag level. Continue home AV nodal blocking regimen.  Continue outpatient Eliquis.  Monitor on telemetry.                 #) GERD: documented h/o such; on Protonix as outpatient.  However, in the context of currently pending serum magnesium level, will hold home Protonix for now, until this level has been resulted.  Plan: Hold home PPI while awaiting serum magnesium level.                  #) Hyperlipidemia: documented h/o such. On high intensity rosuvastatin as outpatient.   Plan: continue home statin.                 #) Chronic tobacco abuse: Patient conveys that they are a current smoker, having recently decreased to daily cigarette use, as quantified above.  Notable in the context of underlying COPD.  Plan: Counseled the patient for less than 2 minutes on the importance of complete smoking discontinuation.  Order placed for prn nicotine patch for use during this hospitalization.                  #) Anemia of chronic disease: Documented history of such, a/w with baseline hgb range 10-12, with presenting hgb consistent with this range, in the absence of any overt evidence of active bleed.     Plan: Repeat CBC in the morning.      DVT prophylaxis: SCD's + continuation of home Eliquis Code Status: DNR, consistent with CODE STATUS documentation from most recent previous hospitalizations, and confirmed via patient Family Communication: none Disposition Plan: Per Rounding Team Consults called: none;  Admission status: Inpatient    I SPENT GREATER THAN 75  MINUTES IN CLINICAL CARE TIME/MEDICAL DECISION-MAKING IN COMPLETING THIS ADMISSION.     Chaney Born Thurma Priego DO [facility] From 7PM - 7AM   03/06/2023, 11:22 PM

## 2023-03-07 DIAGNOSIS — J441 Chronic obstructive pulmonary disease with (acute) exacerbation: Secondary | ICD-10-CM | POA: Diagnosis not present

## 2023-03-07 LAB — TSH: TSH: 0.854 u[IU]/mL (ref 0.350–4.500)

## 2023-03-07 LAB — CBC WITH DIFFERENTIAL/PLATELET
Abs Immature Granulocytes: 0.01 10*3/uL (ref 0.00–0.07)
Basophils Absolute: 0 10*3/uL (ref 0.0–0.1)
Basophils Relative: 1 %
Eosinophils Absolute: 0.1 10*3/uL (ref 0.0–0.5)
Eosinophils Relative: 1 %
HCT: 38.1 % (ref 36.0–46.0)
Hemoglobin: 11.2 g/dL — ABNORMAL LOW (ref 12.0–15.0)
Immature Granulocytes: 0 %
Lymphocytes Relative: 35 %
Lymphs Abs: 2.2 10*3/uL (ref 0.7–4.0)
MCH: 28 pg (ref 26.0–34.0)
MCHC: 29.4 g/dL — ABNORMAL LOW (ref 30.0–36.0)
MCV: 95.3 fL (ref 80.0–100.0)
Monocytes Absolute: 0.6 10*3/uL (ref 0.1–1.0)
Monocytes Relative: 9 %
Neutro Abs: 3.5 10*3/uL (ref 1.7–7.7)
Neutrophils Relative %: 54 %
Platelets: 222 10*3/uL (ref 150–400)
RBC: 4 MIL/uL (ref 3.87–5.11)
RDW: 13.2 % (ref 11.5–15.5)
WBC: 6.4 10*3/uL (ref 4.0–10.5)
nRBC: 0 % (ref 0.0–0.2)

## 2023-03-07 LAB — BLOOD GAS, VENOUS
Acid-Base Excess: 5.6 mmol/L — ABNORMAL HIGH (ref 0.0–2.0)
Bicarbonate: 34.2 mmol/L — ABNORMAL HIGH (ref 20.0–28.0)
O2 Saturation: 30.9 %
Patient temperature: 37
pCO2, Ven: 68 mmHg — ABNORMAL HIGH (ref 44–60)
pH, Ven: 7.31 (ref 7.25–7.43)
pO2, Ven: <31 mmHg — CL (ref 32–45)

## 2023-03-07 LAB — COMPREHENSIVE METABOLIC PANEL
ALT: 14 U/L (ref 0–44)
AST: 13 U/L — ABNORMAL LOW (ref 15–41)
Albumin: 3.2 g/dL — ABNORMAL LOW (ref 3.5–5.0)
Alkaline Phosphatase: 66 U/L (ref 38–126)
Anion gap: 5 (ref 5–15)
BUN: 15 mg/dL (ref 8–23)
CO2: 30 mmol/L (ref 22–32)
Calcium: 9.1 mg/dL (ref 8.9–10.3)
Chloride: 107 mmol/L (ref 98–111)
Creatinine, Ser: 0.62 mg/dL (ref 0.44–1.00)
GFR, Estimated: >60 mL/min (ref >60)
Glucose, Bld: 90 mg/dL (ref 70–99)
Potassium: 4.6 mmol/L (ref 3.5–5.1)
Sodium: 142 mmol/L (ref 135–145)
Total Bilirubin: 0.6 mg/dL (ref 0.3–1.2)
Total Protein: 6.3 g/dL — ABNORMAL LOW (ref 6.5–8.1)

## 2023-03-07 LAB — PROCALCITONIN: Procalcitonin: <0.1 ng/mL

## 2023-03-07 LAB — PHOSPHORUS: Phosphorus: 3.7 mg/dL (ref 2.5–4.6)

## 2023-03-07 LAB — MAGNESIUM: Magnesium: 1.9 mg/dL (ref 1.7–2.4)

## 2023-03-07 MED ORDER — ARFORMOTEROL TARTRATE 15 MCG/2ML IN NEBU
15.0000 ug | INHALATION_SOLUTION | Freq: Two times a day (BID) | RESPIRATORY_TRACT | Status: DC
  Administered 2023-03-07 – 2023-03-10 (×7): 15 ug via RESPIRATORY_TRACT
  Filled 2023-03-07 (×7): qty 2

## 2023-03-07 MED ORDER — BUDESONIDE 0.25 MG/2ML IN SUSP
0.2500 mg | Freq: Two times a day (BID) | RESPIRATORY_TRACT | Status: DC
  Administered 2023-03-07 – 2023-03-10 (×7): 0.25 mg via RESPIRATORY_TRACT
  Filled 2023-03-07 (×7): qty 2

## 2023-03-07 MED ORDER — LEVALBUTEROL HCL 1.25 MG/0.5ML IN NEBU
1.2500 mg | INHALATION_SOLUTION | Freq: Four times a day (QID) | RESPIRATORY_TRACT | Status: DC
  Administered 2023-03-07 (×2): 1.25 mg via RESPIRATORY_TRACT
  Filled 2023-03-07 (×3): qty 0.5

## 2023-03-07 MED ORDER — LEVALBUTEROL HCL 1.25 MG/0.5ML IN NEBU
1.2500 mg | INHALATION_SOLUTION | Freq: Four times a day (QID) | RESPIRATORY_TRACT | Status: DC
  Filled 2023-03-07: qty 0.5

## 2023-03-07 MED ORDER — LEVALBUTEROL HCL 1.25 MG/0.5ML IN NEBU
1.2500 mg | INHALATION_SOLUTION | Freq: Three times a day (TID) | RESPIRATORY_TRACT | Status: DC
  Administered 2023-03-08 – 2023-03-10 (×8): 1.25 mg via RESPIRATORY_TRACT
  Filled 2023-03-07 (×8): qty 0.5

## 2023-03-07 MED ORDER — IPRATROPIUM BROMIDE 0.02 % IN SOLN
0.5000 mg | Freq: Three times a day (TID) | RESPIRATORY_TRACT | Status: DC
  Administered 2023-03-08 – 2023-03-10 (×8): 0.5 mg via RESPIRATORY_TRACT
  Filled 2023-03-07 (×8): qty 2.5

## 2023-03-07 NOTE — Progress Notes (Signed)
PROGRESS NOTE    Tracy Park  ZOX:[id number] DOB: 1955/10/16 DOA: 03/06/2023 PCP: Jacquelin Hawking, PA-C  Chief Complaint  Patient presents with   Shortness of Breath    Brief Narrative:   Tracy Park is Tracy Park 67 y.o. female with medical history significant for lung cancer status post left upper lobectomy, chronic hypoxic respiratory failure on continuous 2 L nasal cannula, COPD, send hypertension, hyperlipidemia, generalized anxiety disorder, paroxysmal atrial fibrillation chronically anticoagulated on Eliquis, who is admitted to [facility] on 03/06/2023 with acute COPD exacerbation after presenting from home to [facility] ED complaining of shortness of breath.   She has Kylen Schliep documented history of allergic reaction to prednisone, noting that she has previously experienced hives in response to this medication, but is tolerated solumedrol during hospitalizations for acute COPD exacerbation.   Assessment & Plan:   Principal Problem:   Acute exacerbation of chronic obstructive pulmonary disease (COPD) (HCC) Active Problems:   Acquired hypothyroidism   Tobacco abuse   GERD (gastroesophageal reflux disease)   Hypokalemia   Chronic hypoxic respiratory failure (HCC)   Hyperlipidemia   Essential hypertension   Paroxysmal atrial fibrillation (HCC)   Hypocalcemia   GAD (generalized anxiety disorder)   Anemia of chronic disease  Acute on Chronic Hypoxic Respiratory Failure COPD exacerbation CXR without active cardiopulmonary disease Scheduled and prn nebs, steroids, doxycycline She continues to wheeze today with increased wob, though is currently on her home o2  Generalized Anxiety Disorder  Panic Attacks She first described her reason for admission to me as Katerra Ingman panic attack Certainly probably has anxiety related to her chronic respiratory failure and SOB/air hunger Received 5 day rx for xanax at recent discharge, but this is not Myiah Petkus long term med Hold further xanax for now Could  consider SSRI for chronic symptoms, I think benzodiazepines are best reserved for outpatient prescribers if this is to be Dub Maclellan chronic medication given chronic risks associated with it.  Hypokalemia Severe on presentation, now improved  Hypocalcemia Improved  Atrial Fibrillation Continue eliquis   Hypertension Metoprolol Holding home losartan  Hypothyroidism Synthroid   Dyslipidemia Crestor     DVT prophylaxis: eliquis Code Status: dnr Family Communication: none Disposition:   Status is: Inpatient Remains inpatient appropriate because: need for continued inpatient care   Consultants:  none  Procedures:  none  Antimicrobials:  Anti-infectives (From admission, onward)    Start     Dose/Rate Route Frequency Ordered Stop   03/06/23 2300  doxycycline (VIBRAMYCIN) 100 mg in sodium chloride 0.9 % 250 mL IVPB       Note to Pharmacy: (In setting of acute copd exac)   100 mg 125 mL/hr over 120 Minutes Intravenous Every 12 hours 03/06/23 2253         Subjective: She notes she's here due to Jacora Hopkins panic attack Combination with COPD  Says everything triggers her COPD   Objective: Vitals:   03/07/23 0400 03/07/23 0630 03/07/23 0700 03/07/23 0701  BP: 108/74 109/73 101/63   Pulse: 68 91 71   Resp: 18 (!) 26 20   Temp:    98.3 F (36.8 C)  TempSrc:    Oral  SpO2: 95% 98% 96%   Weight:      Height:        Intake/Output Summary (Last 24 hours) at 03/07/2023 0937 Last data filed at 03/07/2023 0258 Gross per 24 hour  Intake 350 ml  Output --  Net 350 ml   [employer]   03/07/23  0000  Weight: 47.9 kg    Examination:  General exam: Appears calm and comfortable  Respiratory system: diffuse expiratory wheezing Cardiovascular system: RRR Gastrointestinal system: Abdomen is nondistended, soft and nontender.  Central nervous system: Alert and oriented. No focal neurological deficits. Extremities: no lee    Data Reviewed: I have personally reviewed following  labs and imaging studies  CBC: Recent Labs  Lab 03/03/23 1818 03/06/23 1938 03/07/23 0538  WBC 12.2* 9.9 6.4  NEUTROABS  --  6.3 3.5  HGB 11.9* 10.5* 11.2*  HCT 39.2 35.4* 38.1  MCV 92.9 94.7 95.3  PLT 312 212 222    Basic Metabolic Panel: Recent Labs  Lab 03/03/23 1818 03/06/23 1938 03/07/23 0538  NA 141 142 142  K 3.6 2.7* 4.6  CL 101 114* 107  CO2 32 23 30  GLUCOSE 113* 84 90  BUN 11 14 15   CREATININE 0.58 0.40* 0.62  CALCIUM 9.1 6.4* 9.1  MG  --  1.8 1.9  PHOS  --   --  3.7    GFR: Estimated Creatinine Clearance: 53.7 mL/min (by C-G formula based on SCr of 0.62 mg/dL).  Liver Function Tests: Recent Labs  Lab 03/07/23 0538  AST 13*  ALT 14  ALKPHOS 66  BILITOT 0.6  PROT 6.3*  ALBUMIN 3.2*    CBG: No results for input(s): "GLUCAP" in the last 168 hours.   No results found for this or any previous visit (from the past 240 hour(s)).       Radiology Studies: DG Chest 2 View  Result Date: 03/06/2023 CLINICAL DATA:  Shortness of breath. EXAM: CHEST - 2 VIEW COMPARISON:  CT chest dated Jan 25, 2019 and chest radiograph dated March 03, 2019 FINDINGS: The heart size and mediastinal contours are within normal limits. Hyperinflated lungs suggesting moderate emphysema. Chronic left lung volume loss, apical atelectasis and pleural/parenchymal scarring, unchanged. No pleural effusion or pneumothorax. No acute osseous abnormality. IMPRESSION: 1. No active cardiopulmonary disease. 2. Chronic left lung volume loss, apical atelectasis and pleural/parenchymal scarring. COPD Electronically Signed   By: Larose Hires D.O.   On: 03/06/2023 19:56        Scheduled Meds:  apixaban  5 mg Oral BID   ipratropium  0.5 mg Nebulization Q6H   levalbuterol  1.25 mg Nebulization Q6H   levothyroxine  125 mcg Oral Q0600   methylPREDNISolone (SOLU-MEDROL) injection  80 mg Intravenous Q12H   metoprolol succinate  12.5 mg Oral Daily   rosuvastatin  20 mg Oral Daily   Continuous  Infusions:  doxycycline (VIBRAMYCIN) IV Stopped (03/07/23 0258)     LOS: 1 day    Time spent: over 30 min    Lacretia Nicks, MD [facility]   To contact the attending provider between 7A-7P or the covering provider during after hours 7P-7A, please log into the web site [email or web address] and access using universal [place] password for that web site. If you do not have the password, please call the hospital operator.  03/07/2023, 9:37 AM

## 2023-03-07 NOTE — Progress Notes (Signed)
Mobility Specialist - Progress Note   03/07/23 1421  Oxygen Therapy  SpO2 92 %  O2 Device Nasal Cannula  O2 Flow Rate (L/min) 2 L/min  Patient Activity (if Appropriate) Ambulating  Mobility  Activity Ambulated independently in hallway  Level of Assistance Independent  Assistive Device None  Distance Ambulated (ft) 200 ft  Activity Response Tolerated well  Mobility Referral Yes  $Mobility charge 1 Mobility  Mobility Specialist Start Time (ACUTE ONLY) 0211  Mobility Specialist Stop Time (ACUTE ONLY) 0221  Mobility Specialist Time Calculation (min) (ACUTE ONLY) 10 min   Pt received in bed and agreeable to mobility. No complaints during session. Pt to bed after session with all needs met.    Pre-mobility: 95% SpO2 During mobility: 92% SpO2 Post-mobility: 91% SPO2  Chief Technology Officer

## 2023-03-07 NOTE — ED Notes (Signed)
ED TO INPATIENT HANDOFF REPORT  Name/Age/Gender Tracy Park 67 y.o. female  Code Status    Code Status Orders  (From admission, onward)           Start     Ordered   03/06/23 2249  Do not attempt resuscitation (DNR)  Continuous       Question Answer Comment  If patient has no pulse and is not breathing Do Not Attempt Resuscitation   If patient has a pulse and/or is breathing: Medical Treatment Goals LIMITED ADDITIONAL INTERVENTIONS: Use medication/IV fluids and cardiac monitoring as indicated; Do not use intubation or mechanical ventilation (DNI), also provide comfort medications.  Transfer to Progressive/Stepdown as indicated, avoid Intensive Care.   Consent: Discussion documented in EHR or advanced directives reviewed      03/06/23 2249           Code Status History     Date Active Date Inactive Code Status Order ID Comments User Context   02/23/2023 2046 02/26/2023 2245 DNR [id number]  Briscoe Deutscher, MD ED   01/25/2023 2350 01/28/2023 1928 DNR [id number]  Anselm Jungling, DO Inpatient   01/08/2023 0235 01/13/2023 1928 DNR [id number]  Hillary Bow, DO Inpatient   01/08/2023 0117 01/08/2023 0235 Full Code [id number]  Hillary Bow, DO ED   03/24/2022 1159 03/28/2022 1546 Full Code [id number]  Bobette Mo, MD ED   03/14/2022 2132 03/16/2022 1602 Full Code [id number]  Karl Ito, MD Inpatient   03/14/2022 0331 03/14/2022 2132 DNR [id number]  Tomma Lightning, MD ED   03/13/2022 2149 03/14/2022 0331 DNR [id number]  Mansy, Vernetta Honey, MD ED   02/26/2022 0200 03/01/2022 1555 DNR [id number]  Anselm Jungling, DO ED   08/16/2021 2316 08/25/2021 2324 DNR [id number]  John Giovanni, MD ED   08/16/2021 2253 08/16/2021 2316 Full Code [id number]  John Giovanni, MD ED   05/03/2021 0052 05/05/2021 1756 Full Code [id number]  Eduard Clos, MD ED   04/10/2020 0342 04/12/2020 2137 Full Code [id number]  Eduard Clos, MD ED   04/28/2012 [phone] 04/28/2012 2047 Full Code [id number]  Kelby Aline, RN Inpatient       Home/SNF/Other Home  Chief Complaint Acute exacerbation of chronic obstructive pulmonary disease (COPD) (HCC) [J44.1]  Level of Care/Admitting Diagnosis ED Disposition     ED Disposition  Admit   Condition  --   Comment  Hospital Area: [facility] [[id number]]  Level of Care: Telemetry [5]  Admit to tele based on following criteria: Monitor for Ischemic changes  May admit patient to [facility] or [facility] if equivalent level of care is available:: No  Covid Evaluation: Asymptomatic - no recent exposure (last 10 days) testing not required  Diagnosis: Acute exacerbation of chronic obstructive pulmonary disease (COPD) [facility]) [[id number]]  Admitting Physician: Angie Fava [[id number]]  Attending Physician: Onnie Boer  Certification:: I certify this patient will need inpatient services for at least 2 midnights  Estimated Length of Stay: 2          Medical History Past Medical History:  Diagnosis Date   Anginal pain (HCC)    Anxiety    Bipolar disorder (HCC)    Cancer (HCC)    COPD (chronic obstructive pulmonary disease) (HCC)    Dyspnea    Family history of adverse reaction to anesthesia    History of kidney stones    Hydroureteronephrosis 08/16/2021   Hypothyroidism    Lung cancer (HCC)  Myocardial infarction (HCC)    Paroxysmal atrial fibrillation (HCC)    PTSD (post-traumatic stress disorder)    Sleep apnea    Thyroid disease     Allergies Allergies  Allergen Reactions   Red Dye Hives, Itching and Other (See Comments)    Red food dye   Oxycontin [Oxycodone Hcl] Other (See Comments)    Hallucinations    Prednisone Hives and Nausea And Vomiting   Aspirin Hives   Strawberry Extract Hives and Itching   Tomato Hives and Itching   Tape Rash and Other (See Comments)    Prefers paper tape   Wound Dressing Adhesive Rash    IV Location/Drains/Wounds Patient Lines/Drains/Airways Status      Active Line/Drains/Airways     Name Placement date Placement time Site Days   Peripheral IV 03/06/23 20 G Anterior;Left Forearm 03/06/23  1919  Forearm  1            Labs/Imaging Results for orders placed or performed during the hospital encounter of 03/06/23 (from the past 48 hour(s))  Basic metabolic panel     Status: Abnormal   Collection Time: 03/06/23  7:38 PM  Result Value Ref Range   Sodium 142 135 - 145 mmol/L   Potassium 2.7 (LL) 3.5 - 5.1 mmol/L    Comment: CRITICAL RESULT CALLED TO, READ BACK BY AND VERIFIED WITH Nathanial Millman, RN 03/06/23 2019 BY K. DAVIS   Chloride 114 (H) 98 - 111 mmol/L   CO2 23 22 - 32 mmol/L   Glucose, Bld 84 70 - 99 mg/dL    Comment: Glucose reference range applies only to samples taken after fasting for at least 8 hours.   BUN 14 8 - 23 mg/dL   Creatinine, Ser 1.61 (L) 0.44 - 1.00 mg/dL   Calcium 6.4 (LL) 8.9 - 10.3 mg/dL    Comment: CRITICAL RESULT CALLED TO, READ BACK BY AND VERIFIED WITH Nathanial Millman, RN 03/03/23 2019 BY K. DAVIS   GFR, Estimated >60 >60 mL/min    Comment: (NOTE) Calculated using the CKD-EPI Creatinine Equation (2021)    Anion gap 5 5 - 15    Comment: Performed at [facility], [address] [place], [place] [address]  Brain natriuretic peptide     Status: None   Collection Time: 03/06/23  7:38 PM  Result Value Ref Range   B Natriuretic Peptide 54.6 0.0 - 100.0 pg/mL    Comment: Performed at [facility], [address], [place], [place] [address]  CBC with Differential     Status: Abnormal   Collection Time: 03/06/23  7:38 PM  Result Value Ref Range   WBC 9.9 4.0 - 10.5 K/uL   RBC 3.74 (L) 3.87 - 5.11 MIL/uL   Hemoglobin 10.5 (L) 12.0 - 15.0 g/dL   HCT 11.9 (L) 14.7 - 82.9 %   MCV 94.7 80.0 - 100.0 fL   MCH 28.1 26.0 - 34.0 pg   MCHC 29.7 (L) 30.0 - 36.0 g/dL   RDW 56.2 13.0 - 86.5 %   Platelets 212 150 - 400 K/uL   nRBC 0.0 0.0 - 0.2 %   Neutrophils Relative % 64 %   Neutro  Abs 6.3 1.7 - 7.7 K/uL   Lymphocytes Relative 27 %   Lymphs Abs 2.7 0.7 - 4.0 K/uL   Monocytes Relative 7 %   Monocytes Absolute 0.7 0.1 - 1.0 K/uL   Eosinophils Relative 1 %   Eosinophils Absolute 0.1 0.0 - 0.5  K/uL   Basophils Relative 0 %   Basophils Absolute 0.0 0.0 - 0.1 K/uL   Immature Granulocytes 1 %   Abs Immature Granulocytes 0.05 0.00 - 0.07 K/uL    Comment: Performed at [facility], [address], [place], [place] [address]  Magnesium     Status: None   Collection Time: 03/06/23  7:38 PM  Result Value Ref Range   Magnesium 1.8 1.7 - 2.4 mg/dL    Comment: Performed at [facility], [address], [place], [place] [address]  Procalcitonin     Status: None   Collection Time: 03/06/23  7:38 PM  Result Value Ref Range   Procalcitonin <0.10 ng/mL    Comment:        Interpretation: PCT (Procalcitonin) <= 0.5 ng/mL: Systemic infection (sepsis) is not likely. Local bacterial infection is possible. (NOTE)       Sepsis PCT Algorithm           Lower Respiratory Tract                                      Infection PCT Algorithm    ----------------------------     ----------------------------         PCT < 0.25 ng/mL                PCT < 0.10 ng/mL          Strongly encourage             Strongly discourage   discontinuation of antibiotics    initiation of antibiotics    ----------------------------     -----------------------------       PCT 0.25 - 0.50 ng/mL            PCT 0.10 - 0.25 ng/mL               OR       >80% decrease in PCT            Discourage initiation of                                            antibiotics      Encourage discontinuation           of antibiotics    ----------------------------     -----------------------------         PCT >= 0.50 ng/mL              PCT 0.26 - 0.50 ng/mL               AND        <80% decrease in PCT             Encourage initiation of                                              antibiotics       Encourage continuation           of antibiotics    ----------------------------     -----------------------------        PCT >= 0.50 ng/mL  PCT > 0.50 ng/mL               AND         increase in PCT                  Strongly encourage                                      initiation of antibiotics    Strongly encourage escalation           of antibiotics                                     -----------------------------                                           PCT <= 0.25 ng/mL                                                 OR                                        > 80% decrease in PCT                                      Discontinue / Do not initiate                                             antibiotics  Performed at [facility], [address] [place], [place] [address]   CBC with Differential/Platelet     Status: Abnormal   Collection Time: 03/07/23  5:38 AM  Result Value Ref Range   WBC 6.4 4.0 - 10.5 K/uL   RBC 4.00 3.87 - 5.11 MIL/uL   Hemoglobin 11.2 (L) 12.0 - 15.0 g/dL   HCT 60.4 54.0 - 98.1 %   MCV 95.3 80.0 - 100.0 fL   MCH 28.0 26.0 - 34.0 pg   MCHC 29.4 (L) 30.0 - 36.0 g/dL   RDW 19.1 47.8 - 29.5 %   Platelets 222 150 - 400 K/uL   nRBC 0.0 0.0 - 0.2 %   Neutrophils Relative % 54 %   Neutro Abs 3.5 1.7 - 7.7 K/uL   Lymphocytes Relative 35 %   Lymphs Abs 2.2 0.7 - 4.0 K/uL   Monocytes Relative 9 %   Monocytes Absolute 0.6 0.1 - 1.0 K/uL   Eosinophils Relative 1 %   Eosinophils Absolute 0.1 0.0 - 0.5 K/uL   Basophils Relative 1 %   Basophils Absolute 0.0 0.0 - 0.1 K/uL   Immature Granulocytes 0 %   Abs Immature Granulocytes 0.01 0.00 - 0.07 K/uL    Comment: Performed at [facility], [address] [place], [place] [address]  Comprehensive metabolic panel     Status: Abnormal   Collection Time: 03/07/23  5:38 AM  Result Value Ref Range   Sodium 142 135 - 145 mmol/L   Potassium  4.6 3.5 - 5.1 mmol/L   Chloride 107 98 - 111 mmol/L   CO2 30 22 - 32 mmol/L   Glucose, Bld 90 70 - 99 mg/dL    Comment: Glucose reference range applies only to samples taken after fasting for at least 8 hours.   BUN 15 8 - 23 mg/dL   Creatinine, Ser 1.61 0.44 - 1.00 mg/dL   Calcium 9.1 8.9 - 09.6 mg/dL    Comment: DELTA CHECK NOTED   Total Protein 6.3 (L) 6.5 - 8.1 g/dL   Albumin 3.2 (L) 3.5 - 5.0 g/dL   AST 13 (L) 15 - 41 U/L   ALT 14 0 - 44 U/L   Alkaline Phosphatase 66 38 - 126 U/L   Total Bilirubin 0.6 0.3 - 1.2 mg/dL   GFR, Estimated >04 >54 mL/min    Comment: (NOTE) Calculated using the CKD-EPI Creatinine Equation (2021)    Anion gap 5 5 - 15    Comment: Performed at [facility], [address] [place], [place] [address]  Magnesium     Status: None   Collection Time: 03/07/23  5:38 AM  Result Value Ref Range   Magnesium 1.9 1.7 - 2.4 mg/dL    Comment: Performed at [facility], [address] [place], [place] [address]  Phosphorus     Status: None   Collection Time: 03/07/23  5:38 AM  Result Value Ref Range   Phosphorus 3.7 2.5 - 4.6 mg/dL    Comment: Performed at [facility], [address], [place], [place] [address]  Blood gas, venous     Status: Abnormal   Collection Time: 03/07/23  5:38 AM  Result Value Ref Range   pH, Ven 7.31 7.25 - 7.43   pCO2, Ven 68 (H) 44 - 60 mmHg   pO2, Ven <31 (LL) 32 - 45 mmHg    Comment: CRITICAL RESULT CALLED TO, READ BACK BY AND VERIFIED WITH: HRigoberto Noel, RN 03/07/23 1308 BY K. DAVIS    Bicarbonate 34.2 (H) 20.0 - 28.0 mmol/L   Acid-Base Excess 5.6 (H) 0.0 - 2.0 mmol/L   O2 Saturation 30.9 %   Patient temperature 37.0     Comment: Performed at [facility], [address] [place], [place] [address]   DG Chest 2 View  Result Date: 03/06/2023 CLINICAL DATA:  Shortness of breath. EXAM: CHEST - 2 VIEW COMPARISON:  CT chest dated Jan 25, 2019 and chest  radiograph dated March 03, 2019 FINDINGS: The heart size and mediastinal contours are within normal limits. Hyperinflated lungs suggesting moderate emphysema. Chronic left lung volume loss, apical atelectasis and pleural/parenchymal scarring, unchanged. No pleural effusion or pneumothorax. No acute osseous abnormality. IMPRESSION: 1. No active cardiopulmonary disease. 2. Chronic left lung volume loss, apical atelectasis and pleural/parenchymal scarring. COPD Electronically Signed   By: Larose Hires D.O.   On: 03/06/2023 19:56    Pending Labs Unresulted Labs (From admission, onward)     Start     Ordered   03/06/23 2319  TSH  Add-on,   AD        03/06/23 2318            Vitals/Pain Today's Vitals   03/07/23 0930 03/07/23 0945 03/07/23 1000 03/07/23 1032  BP: 112/83  109/73  Pulse: 70 70 72 77  Resp: (!) 25 (!) 23 17   Temp:      TempSrc:      SpO2: 95% 95% 96%   Weight:      Height:      PainSc:        Isolation Precautions No active isolations  Medications Medications  acetaminophen (TYLENOL) tablet 650 mg (has no administration in time range)    Or  acetaminophen (TYLENOL) suppository 650 mg (has no administration in time range)  melatonin tablet 3 mg (has no administration in time range)  ondansetron (ZOFRAN) injection 4 mg (has no administration in time range)  methylPREDNISolone sodium succinate (SOLU-MEDROL) 125 mg/2 mL injection 80 mg (80 mg Intravenous Given 03/07/23 0744)  doxycycline (VIBRAMYCIN) 100 mg in sodium chloride 0.9 % 250 mL IVPB (0 mg Intravenous Stopped 03/07/23 0258)  levalbuterol (XOPENEX) nebulizer solution 1.25 mg (has no administration in time range)  ipratropium (ATROVENT) nebulizer solution 0.5 mg (0.5 mg Nebulization Not Given 03/07/23 0733)  apixaban (ELIQUIS) tablet 5 mg (5 mg Oral Given 03/07/23 1032)  levothyroxine (SYNTHROID) tablet 125 mcg (125 mcg Oral Given 03/07/23 0525)  metoprolol succinate (TOPROL-XL) 24 hr tablet 12.5 mg (12.5 mg Oral  Given 03/07/23 1032)  rosuvastatin (CRESTOR) tablet 20 mg (20 mg Oral Given 03/07/23 1032)  nicotine (NICODERM CQ - dosed in mg/24 hours) patch 14 mg (has no administration in time range)  arformoterol (BROVANA) nebulizer solution 15 mcg (has no administration in time range)  budesonide (PULMICORT) nebulizer solution 0.25 mg (has no administration in time range)  levalbuterol (XOPENEX) nebulizer solution 1.25 mg (has no administration in time range)  ALPRAZolam (XANAX) tablet 0.5 mg (0.5 mg Oral Given 03/06/23 2045)  potassium chloride 10 mEq in 100 mL IVPB (0 mEq Intravenous Stopped 03/06/23 2302)  potassium chloride (KLOR-CON) packet 40 mEq (40 mEq Oral Given 03/06/23 2048)  potassium chloride SA (KLOR-CON M) CR tablet 40 mEq (40 mEq Oral Given 03/07/23 0024)    Mobility walks with person assist

## 2023-03-07 NOTE — ED Notes (Signed)
Date and time results received: 03/07/23 6:38 AM  (use smartphrase ".now" to insert current time)  Test: Blood gas PO2 Critical Value: <31  Name of Provider Notified: Howerter, J  Orders Received? Or Actions Taken?: Orders Received - See Orders for details

## 2023-03-07 NOTE — Plan of Care (Signed)

## 2023-03-08 ENCOUNTER — Encounter (HOSPITAL_COMMUNITY): Payer: Self-pay | Admitting: Internal Medicine

## 2023-03-08 ENCOUNTER — Inpatient Hospital Stay (HOSPITAL_COMMUNITY): Payer: 59

## 2023-03-08 DIAGNOSIS — J441 Chronic obstructive pulmonary disease with (acute) exacerbation: Secondary | ICD-10-CM | POA: Diagnosis not present

## 2023-03-08 LAB — TROPONIN I (HIGH SENSITIVITY)
Troponin I (High Sensitivity): 4 ng/L (ref <18)
Troponin I (High Sensitivity): 4 ng/L (ref <18)

## 2023-03-08 MED ORDER — ALPRAZOLAM 0.5 MG PO TABS
0.5000 mg | ORAL_TABLET | Freq: Two times a day (BID) | ORAL | Status: DC | PRN
  Administered 2023-03-08 – 2023-03-10 (×3): 0.5 mg via ORAL
  Filled 2023-03-08 (×3): qty 1

## 2023-03-08 MED ORDER — ENSURE ENLIVE PO LIQD
237.0000 mL | Freq: Two times a day (BID) | ORAL | Status: DC
  Administered 2023-03-08 – 2023-03-10 (×3): 237 mL via ORAL

## 2023-03-08 NOTE — Plan of Care (Signed)
  Problem: Clinical Measurements: Goal: Diagnostic test results will improve Outcome: Progressing Goal: Respiratory complications will improve Outcome: Progressing   Problem: Activity: Goal: Risk for activity intolerance will decrease Outcome: Progressing   Problem: Nutrition: Goal: Adequate nutrition will be maintained Outcome: Progressing   Problem: Coping: Goal: Level of anxiety will decrease Outcome: Progressing   Problem: Safety: Goal: Ability to remain free from injury will improve Outcome: Progressing   

## 2023-03-08 NOTE — Progress Notes (Signed)
Mobility Specialist - Progress Note   03/08/23 0944  Oxygen Therapy  SpO2 95 %  O2 Device Nasal Cannula  O2 Flow Rate (L/min) 2 L/min  Patient Activity (if Appropriate) Ambulating  Mobility  Activity Ambulated independently in hallway  Level of Assistance Independent  Assistive Device None  Distance Ambulated (ft) 350 ft  Activity Response Tolerated well  Mobility Referral Yes  $Mobility charge 1 Mobility  Mobility Specialist Start Time (ACUTE ONLY) 0932  Mobility Specialist Stop Time (ACUTE ONLY) 0944  Mobility Specialist Time Calculation (min) (ACUTE ONLY) 12 min   Pt received in bed and agreeable to mobility. Throughout session pt had some wheezing. No complaints during session. Pt to bed after session with all needs met.    Pre-mobility: 85 HR, 95% SpO2 During mobility: 95 HR, 90% SpO2 Post-mobility: 110 HR, 90% SPO2  Chief Technology Officer

## 2023-03-08 NOTE — Progress Notes (Signed)
PROGRESS NOTE    Tracy Park  ZOX:[id number] DOB: 10/08/1955 DOA: 03/06/2023 PCP: Jacquelin Hawking, PA-C  Chief Complaint  Patient presents with   Shortness of Breath    Brief Narrative:   Tracy Park is Tracy Park 67 y.o. female with medical history significant for lung cancer status post left upper lobectomy, chronic hypoxic respiratory failure on continuous 2 L nasal cannula, COPD, send hypertension, hyperlipidemia, generalized anxiety disorder, paroxysmal atrial fibrillation chronically anticoagulated on Eliquis, who is admitted to [facility] on 03/06/2023 with acute COPD exacerbation after presenting from home to [facility] ED complaining of shortness of breath.   She has Tracy Park documented history of allergic reaction to prednisone, noting that she has previously experienced hives in response to this medication, but is tolerated solumedrol during hospitalizations for acute COPD exacerbation.   Assessment & Plan:   Principal Problem:   Acute exacerbation of chronic obstructive pulmonary disease (COPD) (HCC) Active Problems:   Acquired hypothyroidism   Tobacco abuse   GERD (gastroesophageal reflux disease)   Hypokalemia   Chronic hypoxic respiratory failure (HCC)   Hyperlipidemia   Essential hypertension   Paroxysmal atrial fibrillation (HCC)   Hypocalcemia   GAD (generalized anxiety disorder)   Anemia of chronic disease  Acute on Chronic Hypoxic Respiratory Failure COPD exacerbation CXR without active cardiopulmonary disease Scheduled and prn nebs, steroids, doxycycline Follow chest CT with her panic attacks  She continues to wheeze today with increased wob, though is currently on her home o2  Generalized Anxiety Disorder  Panic Attacks She first described her reason for admission to me as Tracy Park panic attack Certainly probably has anxiety related to her chronic respiratory failure and SOB/air hunger Received 5 day rx for xanax at recent discharge, but this is not Tracy Park long term  med Describing continued panic attacks today, will add xanax twice daily as needed - follow EKG, troponin, chest CT Could consider SSRI for chronic symptoms, I think benzodiazepines are best reserved for outpatient prescribers if this is to be Tracy Park chronic medication given chronic risks associated with it.  Hypokalemia Severe on presentation, now improved  Hypocalcemia Improved  Atrial Fibrillation Continue eliquis   Hypertension Metoprolol Holding home losartan  Hypothyroidism Synthroid   Dyslipidemia Crestor     DVT prophylaxis: eliquis Code Status: dnr Family Communication: none Disposition:   Status is: Inpatient Remains inpatient appropriate because: need for continued inpatient care   Consultants:  none  Procedures:  none  Antimicrobials:  Anti-infectives (From admission, onward)    Start     Dose/Rate Route Frequency Ordered Stop   03/06/23 2300  doxycycline (VIBRAMYCIN) 100 mg in sodium chloride 0.9 % 250 mL IVPB       Note to Pharmacy: (In setting of acute copd exac)   100 mg 125 mL/hr over 120 Minutes Intravenous Every 12 hours 03/06/23 2253         Subjective: Says she's having Tracy Sieler panic attack    Objective: Vitals:   03/08/23 0739 03/08/23 0944 03/08/23 1345 03/08/23 1358  BP:    116/72  Pulse:    85  Resp:    17  Temp:    98 F (36.7 C)  TempSrc:    Oral  SpO2: 99% 95% 95% 98%  Weight:      Height:        Intake/Output Summary (Last 24 hours) at 03/08/2023 1416 Last data filed at 03/08/2023 1300 Gross per 24 hour  Intake 480 ml  Output --  Net  480 ml   Filed Weights   03/07/23 0000 03/08/23 0619  Weight: 47.9 kg 47.4 kg    Examination:  General: uncomfortable, anxious Cardiovascular: RRR Lungs: scattered wheezing, exam seems Verlyn Dannenberg bit better than yesterday, but looks anxious on exam Neurological: Alert and oriented 3. Moves all extremities 4 with equal strength. Cranial nerves II through XII grossly intact. Extremities: No  clubbing or cyanosis. No edema.   Data Reviewed: I have personally reviewed following labs and imaging studies  CBC: Recent Labs  Lab 03/03/23 1818 03/06/23 1938 03/07/23 0538  WBC 12.2* 9.9 6.4  NEUTROABS  --  6.3 3.5  HGB 11.9* 10.5* 11.2*  HCT 39.2 35.4* 38.1  MCV 92.9 94.7 95.3  PLT 312 212 222    Basic Metabolic Panel: Recent Labs  Lab 03/03/23 1818 03/06/23 1938 03/07/23 0538  NA 141 142 142  K 3.6 2.7* 4.6  CL 101 114* 107  CO2 32 23 30  GLUCOSE 113* 84 90  BUN 11 14 15   CREATININE 0.58 0.40* 0.62  CALCIUM 9.1 6.4* 9.1  MG  --  1.8 1.9  PHOS  --   --  3.7    GFR: Estimated Creatinine Clearance: 53.2 mL/min (by C-G formula based on SCr of 0.62 mg/dL).  Liver Function Tests: Recent Labs  Lab 03/07/23 0538  AST 13*  ALT 14  ALKPHOS 66  BILITOT 0.6  PROT 6.3*  ALBUMIN 3.2*    CBG: No results for input(s): "GLUCAP" in the last 168 hours.   No results found for this or any previous visit (from the past 240 hour(s)).       Radiology Studies: DG Chest 2 View  Result Date: 03/06/2023 CLINICAL DATA:  Shortness of breath. EXAM: CHEST - 2 VIEW COMPARISON:  CT chest dated Jan 25, 2019 and chest radiograph dated March 03, 2019 FINDINGS: The heart size and mediastinal contours are within normal limits. Hyperinflated lungs suggesting moderate emphysema. Chronic left lung volume loss, apical atelectasis and pleural/parenchymal scarring, unchanged. No pleural effusion or pneumothorax. No acute osseous abnormality. IMPRESSION: 1. No active cardiopulmonary disease. 2. Chronic left lung volume loss, apical atelectasis and pleural/parenchymal scarring. COPD Electronically Signed   By: Larose Hires D.O.   On: 03/06/2023 19:56        Scheduled Meds:  apixaban  5 mg Oral BID   arformoterol  15 mcg Nebulization BID   budesonide (PULMICORT) nebulizer solution  0.25 mg Nebulization BID   feeding supplement  237 mL Oral BID BM   ipratropium  0.5 mg Nebulization TID    levalbuterol  1.25 mg Nebulization TID   levothyroxine  125 mcg Oral Q0600   methylPREDNISolone (SOLU-MEDROL) injection  80 mg Intravenous Q12H   metoprolol succinate  12.5 mg Oral Daily   rosuvastatin  20 mg Oral Daily   Continuous Infusions:  doxycycline (VIBRAMYCIN) IV 100 mg (03/08/23 1137)     LOS: 2 days    Time spent: over 30 min    Lacretia Nicks, MD [facility]   To contact the attending provider between 7A-7P or the covering provider during after hours 7P-7A, please log into the web site [email or web address] and access using universal [place] password for that web site. If you do not have the password, please call the hospital operator.  03/08/2023, 2:16 PM

## 2023-03-09 DIAGNOSIS — J441 Chronic obstructive pulmonary disease with (acute) exacerbation: Secondary | ICD-10-CM | POA: Diagnosis not present

## 2023-03-09 LAB — CBC WITH DIFFERENTIAL/PLATELET
Abs Immature Granulocytes: 0.04 10*3/uL (ref 0.00–0.07)
Basophils Absolute: 0 10*3/uL (ref 0.0–0.1)
Basophils Relative: 0 %
Eosinophils Absolute: 0 10*3/uL (ref 0.0–0.5)
Eosinophils Relative: 0 %
HCT: 34.9 % — ABNORMAL LOW (ref 36.0–46.0)
Hemoglobin: 10.8 g/dL — ABNORMAL LOW (ref 12.0–15.0)
Immature Granulocytes: 0 %
Lymphocytes Relative: 8 %
Lymphs Abs: 0.8 10*3/uL (ref 0.7–4.0)
MCH: 28.8 pg (ref 26.0–34.0)
MCHC: 30.9 g/dL (ref 30.0–36.0)
MCV: 93.1 fL (ref 80.0–100.0)
Monocytes Absolute: 0.1 10*3/uL (ref 0.1–1.0)
Monocytes Relative: 1 %
Neutro Abs: 9.5 10*3/uL — ABNORMAL HIGH (ref 1.7–7.7)
Neutrophils Relative %: 91 %
Platelets: 214 10*3/uL (ref 150–400)
RBC: 3.75 MIL/uL — ABNORMAL LOW (ref 3.87–5.11)
RDW: 13.4 % (ref 11.5–15.5)
WBC: 10.5 10*3/uL (ref 4.0–10.5)
nRBC: 0 % (ref 0.0–0.2)

## 2023-03-09 LAB — COMPREHENSIVE METABOLIC PANEL
ALT: 13 U/L (ref 0–44)
AST: 13 U/L — ABNORMAL LOW (ref 15–41)
Albumin: 3.2 g/dL — ABNORMAL LOW (ref 3.5–5.0)
Alkaline Phosphatase: 62 U/L (ref 38–126)
Anion gap: 7 (ref 5–15)
BUN: 23 mg/dL (ref 8–23)
CO2: 31 mmol/L (ref 22–32)
Calcium: 9 mg/dL (ref 8.9–10.3)
Chloride: 104 mmol/L (ref 98–111)
Creatinine, Ser: 0.77 mg/dL (ref 0.44–1.00)
GFR, Estimated: >60 mL/min (ref >60)
Glucose, Bld: 148 mg/dL — ABNORMAL HIGH (ref 70–99)
Potassium: 4.8 mmol/L (ref 3.5–5.1)
Sodium: 142 mmol/L (ref 135–145)
Total Bilirubin: 0.2 mg/dL — ABNORMAL LOW (ref 0.3–1.2)
Total Protein: 6.1 g/dL — ABNORMAL LOW (ref 6.5–8.1)

## 2023-03-09 LAB — PHOSPHORUS: Phosphorus: 3.7 mg/dL (ref 2.5–4.6)

## 2023-03-09 LAB — MAGNESIUM: Magnesium: 1.9 mg/dL (ref 1.7–2.4)

## 2023-03-09 MED ORDER — TAMSULOSIN HCL 0.4 MG PO CAPS: 0.4000 mg | ORAL_CAPSULE | Freq: Every morning | ORAL | 0 refills | Status: DC

## 2023-03-09 MED ORDER — PREDNISONE 10 MG PO TABS: ORAL_TABLET | ORAL | 0 refills | Status: DC

## 2023-03-09 MED ORDER — METOPROLOL SUCCINATE ER 25 MG PO TB24: 12.5000 mg | ORAL_TABLET | Freq: Every day | ORAL | 0 refills | Status: DC

## 2023-03-09 MED ORDER — LOSARTAN POTASSIUM 25 MG PO TABS: 12.5000 mg | ORAL_TABLET | Freq: Every morning | ORAL | 0 refills | Status: DC

## 2023-03-09 MED ORDER — ROSUVASTATIN CALCIUM 20 MG PO TABS: 20.0000 mg | ORAL_TABLET | Freq: Every day | ORAL | 0 refills | Status: DC

## 2023-03-09 MED ORDER — ALPRAZOLAM 0.5 MG PO TABS: 0.5000 mg | ORAL_TABLET | Freq: Two times a day (BID) | ORAL | 0 refills | Status: DC | PRN

## 2023-03-09 MED ORDER — DOXYCYCLINE HYCLATE 100 MG PO CAPS: 100.0000 mg | ORAL_CAPSULE | Freq: Two times a day (BID) | ORAL | 0 refills | Status: DC

## 2023-03-09 MED ORDER — APIXABAN 5 MG PO TABS: 5.0000 mg | ORAL_TABLET | Freq: Two times a day (BID) | ORAL | 0 refills | Status: DC

## 2023-03-09 NOTE — Progress Notes (Signed)
SATURATION QUALIFICATIONS: (This note is used to comply with regulatory documentation for home oxygen)  Patient Saturations on Room Air at Rest =   Patient Saturations on Room Air while Ambulating = did not assess RA, baseline preadmission is on 2 L/M [place]  Patient Saturations on 2 Liters of oxygen while Ambulating = 88%  Please briefly explain why patient needs home oxygen:

## 2023-03-09 NOTE — TOC Initial Note (Signed)
Transition of Care [facility]) - Initial/Assessment Note    Patient Details  Name: Tracy Park MRN: [id number] Date of Birth: 07/18/1956  [facility] CM/SW Contact:    Adrian Prows, RN Phone Number: 03/09/2023, 4:56 PM  Clinical Narrative:                 Sherron Monday w/ pt in room; pt says she is from home and plans to return at d/c; she identified POC dtr Blima Rich [phone]); pt deines IPV, food/housing insecurity, and difficulty paying utilities; she has transportation; pt says she has glasses, and dentures (upper/lower); she has a cane and home oxygen w/ [employer]; pt says she has travel tank; she does not have HH services; no TOC needs.  Expected Discharge Plan: Home/Self Care Barriers to Discharge: No Barriers Identified   Patient Goals and CMS Choice Patient states their goals for this hospitalization and ongoing recovery are:: home          Expected Discharge Plan and Services   Discharge Planning Services: CM Consult Post Acute Care Choice: Resumption of Svcs/PTA Provider (home oxygen w/ [employer]) Living arrangements for the past 2 months: Single Family Home Expected Discharge Date: 03/09/23               DME Arranged: N/A DME Agency: NA       HH Arranged: NA HH Agency: NA        Prior Living Arrangements/Services Living arrangements for the past 2 months: Single Family Home Lives with:: Adult Children Patient language and need for interpreter reviewed:: Yes Do you feel safe going back to the place where you live?: Yes      Need for Family Participation in Patient Care: Yes (Comment) Care giver support system in place?: Yes (comment) Current home services: DME (home oxygen) Criminal Activity/Legal Involvement Pertinent to Current Situation/Hospitalization: No - Comment as needed  Activities of Daily Living Home Assistive Devices/Equipment: Oxygen ADL Screening (condition at time of admission) Patient's cognitive ability adequate to  safely complete daily activities?: Yes Is the patient deaf or have difficulty hearing?: No Does the patient have difficulty seeing, even when wearing glasses/contacts?: No Does the patient have difficulty concentrating, remembering, or making decisions?: No Patient able to express need for assistance with ADLs?: Yes Does the patient have difficulty dressing or bathing?: No Independently performs ADLs?: Yes (appropriate for developmental age) Does the patient have difficulty walking or climbing stairs?: No Weakness of Legs: None Weakness of Arms/Hands: None  Permission Sought/Granted Permission sought to share information with : Case Manager Permission granted to share information with : Yes, Verbal Permission Granted  Share Information with NAME: Case Manager     Permission granted to share info w Relationship: Blima Rich (dtr) [phone]     Emotional Assessment Appearance:: Appears stated age Attitude/Demeanor/Rapport: Gracious Affect (typically observed): Accepting Orientation: : Oriented to Self, Oriented to Place, Oriented to  Time, Oriented to Situation Alcohol / Substance Use: Not Applicable Psych Involvement: No (comment)  Admission diagnosis:  Hypocalcemia [E83.51] Hypokalemia [E87.6] Panic attack [F41.0] SOB (shortness of breath) [R06.02] Acute exacerbation of chronic obstructive pulmonary disease (COPD) (HCC) [J44.1] Patient Active Problem List   Diagnosis Date Noted   Acute exacerbation of chronic obstructive pulmonary disease (COPD) (HCC) 03/06/2023   Hypocalcemia 03/06/2023   GAD (generalized anxiety disorder) 03/06/2023   Anemia of chronic disease 03/06/2023   Paroxysmal atrial fibrillation (HCC) 02/23/2023   Atrial fibrillation with RVR (HCC) 01/25/2023   Pulmonary nodule 01/25/2023  COPD (chronic obstructive pulmonary disease) (HCC) 01/25/2023   Essential hypertension 01/25/2023   Thrombocytosis 03/26/2022   COPD exacerbation (HCC) 03/25/2022   DDD  (degenerative disc disease), cervical 03/24/2022   Bipolar 2 disorder (HCC) 03/24/2022   Septic shock (HCC) 03/14/2022   Sepsis due to pneumonia (HCC) 03/13/2022   Dyslipidemia 03/13/2022   Anxiety and depression 03/13/2022   History of lung cancer 03/13/2022   Tobacco abuse 03/13/2022   Pneumonia 02/27/2022   Adenocarcinoma of lung (HCC) 02/26/2022   Leukocytosis 02/26/2022   COPD with acute exacerbation (HCC) 02/26/2022   Chronic hypoxic respiratory failure (HCC) 02/26/2022   Normocytic anemia 02/08/2022   Protein-calorie malnutrition, severe 08/25/2021   Influenza A with pneumonia 08/16/2021   Sepsis (HCC) 08/16/2021   Hyponatremia 08/16/2021   Hypokalemia 08/16/2021   Hypomagnesemia 08/16/2021   Hydronephrosis 05/03/2021   Acute encephalopathy 05/02/2021   CAP (community acquired pneumonia) 04/10/2020   Coronary artery disease involving native coronary artery of native heart without angina pectoris 08/18/2018   Chronic bilateral low back pain without sciatica 08/21/2016   Hyperlipidemia 06/03/2013   GERD (gastroesophageal reflux disease) 04/28/2012   Acquired hypothyroidism 04/28/2012   Nicotine abuse 04/28/2012   Depression 04/28/2012   Chest pain, atypical 04/28/2012   PCP:  Jacquelin Hawking, PA-C Pharmacy:   [facility] [phone] - [address]  - [address] [place] [address] Phone: [phone] Fax: [phone]  [facility] [address] [place] [address] Phone: [phone] Fax: [phone]  [id number] - [place], [place] - [address] AT [facility] OF [address] [place] [address] Phone: [phone] Fax: [phone]     Social Determinants of Health (SDOH) Social History: SDOH Screenings   Food Insecurity: No Food Insecurity (03/09/2023)  Housing: Low Risk  (03/09/2023)  Transportation Needs: No Transportation Needs  (03/09/2023)  Utilities: Not At Risk (03/09/2023)  Tobacco Use: High Risk (03/08/2023)   SDOH Interventions: Food Insecurity Interventions: Inpatient TOC, Intervention Not Indicated Housing Interventions: Intervention Not Indicated, Inpatient TOC Transportation Interventions: Intervention Not Indicated, Inpatient TOC Utilities Interventions: Intervention Not Indicated, Inpatient TOC   Readmission Risk Interventions    03/09/2023    4:52 PM 02/25/2023    4:01 PM 01/28/2023    1:56 PM  Readmission Risk Prevention Plan  Transportation Screening Complete Complete Complete  Medication Review Oceanographer) Complete Complete Complete  PCP or Specialist appointment within 3-5 days of discharge Complete Complete Complete  HRI or Home Care Consult Complete Complete Complete  SW Recovery Care/Counseling Consult Complete Complete Complete  Palliative Care Screening Not Applicable Not Applicable Not Applicable  Skilled Nursing Facility Not Applicable Not Applicable Not Applicable

## 2023-03-09 NOTE — Progress Notes (Signed)
Mobility Specialist - Progress Note   03/09/23 0942  Mobility  Activity Ambulated independently in hallway  Level of Assistance Independent  Assistive Device None  Distance Ambulated (ft) 350 ft  Activity Response Tolerated well  Mobility Referral Yes  $Mobility charge 1 Mobility  Mobility Specialist Start Time (ACUTE ONLY) 0934  Mobility Specialist Stop Time (ACUTE ONLY) 0941  Mobility Specialist Time Calculation (min) (ACUTE ONLY) 7 min   Pt received in bed and agreeable to mobility. No complaints during session. Pt to bed after session with all needs met.    Pre-mobility: 93 HR, 97% pO2 Post-mobility: 100 HR, 93% SPO2  Chief Technology Officer

## 2023-03-09 NOTE — Discharge Summary (Signed)
Physician Discharge Summary  Tracy Park UEA:[id number] DOB: 06/08/1956 DOA: 03/06/2023  PCP: Jacquelin Hawking, PA-C  Admit date: 03/06/2023 Discharge date: 03/09/2023  Time spent: 40 minutes  Recommendations for Outpatient Follow-up:  Follow outpatient CBC/CMP  Needs repeat imaging outpatient for spiculated dependent nodule of left lung  Frequent panic attacks, short course of xanax at discharge, will defer long term prescription/appropriateness of this to PCP/pulmonology outpatient  Refilled many of her home meds, she'll need additional refills outpatient  Discharge Diagnoses:  Principal Problem:   Acute exacerbation of chronic obstructive pulmonary disease (COPD) (HCC) Active Problems:   Acquired hypothyroidism   Tobacco abuse   GERD (gastroesophageal reflux disease)   Hypokalemia   Chronic hypoxic respiratory failure (HCC)   Hyperlipidemia   Essential hypertension   Paroxysmal atrial fibrillation (HCC)   Hypocalcemia   GAD (generalized anxiety disorder)   Anemia of chronic disease   Discharge Condition: stable  Diet recommendation: heart healthy  Filed Weights   03/07/23 0000 03/08/23 0619 03/09/23 0500  Weight: 47.9 kg 47.4 kg 49.4 kg    History of present illness:   Tracy Park is Tracy Park 67 y.o. female with medical history significant for lung cancer status post left upper lobectomy, chronic hypoxic respiratory failure on continuous 2 L nasal cannula, COPD, send hypertension, hyperlipidemia, generalized anxiety disorder, paroxysmal atrial fibrillation chronically anticoagulated on Eliquis, who is admitted to [facility] on 03/06/2023 with acute COPD exacerbation after presenting from home to [facility] ED complaining of shortness of breath.    She has Tracy Park documented history of allergic reaction to prednisone, noting that she has previously experienced hives in response to this medication, but is tolerated solumedrol during hospitalizations for acute COPD  exacerbation.  Improved with conservative management.  Steroids, nebs, abx.  Needs outpatient follow up.  Hospital Course:  Assessment and Plan:  Acute on Chronic Hypoxic Respiratory Failure COPD exacerbation CXR without active cardiopulmonary disease Scheduled and prn nebs, steroids, doxycycline Follow chest CT with her panic attacks -> s/p LU lobectomy, spiculated nodule of dpendent LLL, severe emphysema and diffuse bilateral bronchial wall thickening Discharge home with doxy, steroid taper, continue nebs/controller meds - maintaining sats with ambulation   Generalized Anxiety Disorder  Panic Attacks She first described her reason for admission to me as Tracy Park panic attack Certainly probably has anxiety related to her chronic respiratory failure and SOB/air hunger Received 5 day rx for xanax at recent discharge, but this is not Tracy Park long term med Describing continued panic attacks today, will add xanax twice daily as needed - follow EKG (sinus, appears similar to prior), troponin negative, chest CT noted above Could consider SSRI for chronic symptoms, will send with short course of xanax, needs to follow with PCP/pulmonologist to determine if appropriate for long term use   Spiculated Nodule of Dependent Left Lower Lobe Infectious or inflammatory given distribution, needs to follow this up with her pulmonologist Follow outpatient for repeat imaging  Hypokalemia Severe on presentation, now improved   Hypocalcemia Improved   Atrial Fibrillation Continue eliquis    Hypertension Metoprolol Holding home losartan   Hypothyroidism Synthroid    Dyslipidemia Crestor      Procedures: none   Consultations: none  Discharge Exam: Vitals:   03/09/23 0733 03/09/23 1554  BP:  114/69  Pulse:  77  Resp:  (!) 24  Temp:  98.2 F (36.8 C)  SpO2: 99% 98%   Eager for discharge today  General: No acute distress. Cardiovascular: RRR Lungs: improved  pulm exam, less  wheezing Abdomen: Soft, nontender, nondistended  Neurological: Alert and oriented 3. Moves all extremities 4 with equal strength. Cranial nerves II through XII grossly intact. Extremities: No clubbing or cyanosis. No edema.  Discharge Instructions   Discharge Instructions     Call MD for:  difficulty breathing, headache or visual disturbances   Complete by: As directed    Call MD for:  extreme fatigue   Complete by: As directed    Call MD for:  hives   Complete by: As directed    Call MD for:  persistant dizziness or light-headedness   Complete by: As directed    Call MD for:  persistant nausea and vomiting   Complete by: As directed    Call MD for:  redness, tenderness, or signs of infection (pain, swelling, redness, odor or green/yellow discharge around incision site)   Complete by: As directed    Call MD for:  severe uncontrolled pain   Complete by: As directed    Call MD for:  temperature >100.4   Complete by: As directed    Diet - low sodium heart healthy   Complete by: As directed    Discharge instructions   Complete by: As directed    You were seen for Tracy Park COPD exacerbation and anxiety.  You've improved with steroids, antibiotics, and breathing treatments.  We'll send you home with Tracy Park few days worth of anxiety medicines as well.  Do not combine this with alcohol or any other sedating medications as it could cause respiratory distress/depression.  Use xanax only as needed.  You'll need to follow up with your PCP and/or lung doctor outpatient to get refills of the xanax.  You had Tracy Park CT scan with abnormal findings that will need outpatient follow up.  Your CT scan showed Tracy Park new spiculated nodule in the left lung that was most likely infectious or inflammatory.  It does warrant follow up and please discuss Tracy Park plan for REPEAT IMAGING with your PCP or pulmonologist as an outpatient to rule out other causes.  Return for new, recurrent, or worsening symptoms.  Please ask your PCP to  request records from this hospitalization so they know what was done and what the next steps will be.   Increase activity slowly   Complete by: As directed       Allergies as of 03/09/2023       Reactions   Red Dye Hives, Itching, Other (See Comments)   Red food dye   Oxycontin [oxycodone Hcl] Other (See Comments)   Hallucinations    Prednisone Hives, Nausea And Vomiting   Aspirin Hives   Strawberry Extract Hives, Itching   Tomato Hives, Itching   Tape Rash, Other (See Comments)   Prefers paper tape   Wound Dressing Adhesive Rash        Medication List     STOP taking these medications    amoxicillin-clavulanate 875-125 MG tablet Commonly known as: AUGMENTIN       TAKE these medications    albuterol 108 (90 Base) MCG/ACT inhaler Commonly known as: VENTOLIN HFA Inhale 2 puffs into the lungs every 6 (six) hours as needed for wheezing or shortness of breath. What changed: Another medication with the same name was changed. Make sure you understand how and when to take each.   albuterol (2.5 MG/3ML) 0.083% nebulizer solution Commonly known as: PROVENTIL Take 3 mLs (2.5 mg total) by nebulization every 6 (six) hours as needed for wheezing or shortness of  breath. What changed:  when to take this additional instructions   ALPRAZolam 0.5 MG tablet Commonly known as: XANAX Take 1 tablet (0.5 mg total) by mouth 2 (two) times daily as needed for up to 5 days for anxiety. Please follow up with your pulmonologist and/or your PCP outpatient to determine if this is something you should continue long term or not. What changed:  when to take this additional instructions   apixaban 5 MG Tabs tablet Commonly known as: ELIQUIS Take 1 tablet (5 mg total) by mouth 2 (two) times daily.   Breztri Aerosphere 160-9-4.8 MCG/ACT Aero Generic drug: Budeson-Glycopyrrol-Formoterol Inhale 1 puff into the lungs in the morning and at bedtime.   doxycycline 100 MG capsule Commonly known  as: VIBRAMYCIN Take 1 capsule (100 mg total) by mouth 2 (two) times daily for 3 days.   levothyroxine 125 MCG tablet Commonly known as: SYNTHROID Take 125 mcg by mouth daily before breakfast.   losartan 25 MG tablet Commonly known as: COZAAR Take 0.5 tablets (12.5 mg total) by mouth in the morning.   Magnesium Oxide 400 MG Caps Take 1 capsule (400 mg total) by mouth daily at 12 noon. What changed: when to take this   metoprolol succinate 25 MG 24 hr tablet Commonly known as: TOPROL-XL Take 0.5 tablets (12.5 mg total) by mouth daily.   nitroGLYCERIN 0.4 MG SL tablet Commonly known as: NITROSTAT Place 0.4 mg under the tongue every 5 (five) minutes as needed for chest pain.   OXYGEN Inhale 2 L/min into the lungs continuous.   pantoprazole 40 MG tablet Commonly known as: PROTONIX Take 40 mg by mouth daily before breakfast.   predniSONE 10 MG tablet Commonly known as: DELTASONE Take 4 tablets (40 mg total) by mouth daily for 3 days, THEN 3 tablets (30 mg total) daily for 3 days, THEN 2 tablets (20 mg total) daily for 3 days, THEN 1 tablet (10 mg total) daily for 3 days. Start taking on: March 09, 2023   rosuvastatin 20 MG tablet Commonly known as: CRESTOR Take 1 tablet (20 mg total) by mouth daily.   Systane Complete PF 0.6 % Soln Generic drug: Propylene Glycol (PF) Place 1 drop into both eyes 3 (three) times daily as needed (for dryness).   tamsulosin 0.4 MG Caps capsule Commonly known as: FLOMAX Take 1 capsule (0.4 mg total) by mouth in the morning.       Allergies  Allergen Reactions   Red Dye Hives, Itching and Other (See Comments)    Red food dye   Oxycontin [Oxycodone Hcl] Other (See Comments)    Hallucinations    Prednisone Hives and Nausea And Vomiting   Aspirin Hives   Strawberry Extract Hives and Itching   Tomato Hives and Itching   Tape Rash and Other (See Comments)    Prefers paper tape   Wound Dressing Adhesive Rash      The results of  significant diagnostics from this hospitalization (including imaging, microbiology, ancillary and laboratory) are listed below for reference.    Significant Diagnostic Studies: CT CHEST WO CONTRAST  Result Date: 03/08/2023 CLINICAL DATA:  Respiratory illness, lung cancer, status post left upper lobectomy * Tracking Code: BO * EXAM: CT CHEST WITHOUT CONTRAST TECHNIQUE: Multidetector CT imaging of the chest was performed following the standard protocol without IV contrast. RADIATION DOSE REDUCTION: This exam was performed according to the departmental dose-optimization program which includes automated exposure control, adjustment of the mA and/or kV according to patient size and/or  use of iterative reconstruction technique. COMPARISON:  01/25/2023 FINDINGS: Cardiovascular: Aortic atherosclerosis. Normal heart size. Left and right coronary artery calcifications. No pericardial effusion. Mediastinum/Nodes: No enlarged mediastinal, hilar, or axillary lymph nodes. Thyroid gland, trachea, and esophagus demonstrate no significant findings. Lungs/Pleura: Status post left upper lobectomy. Severe emphysema. Diffuse bilateral bronchial wall thickening. New spiculated nodule of the dependent left lower lobe measuring 1.1 x 0.8 cm (series 6, image 110). Occasional bronchiolar plugging and clustered centrilobular and tree-in-bud nodularity also in the dependent left lower lobe (series 6, image 101). No pleural effusion or pneumothorax. Upper Abdomen: No acute abnormality. Musculoskeletal: No chest wall abnormality. No acute osseous findings. IMPRESSION: 1. Status post left upper lobectomy. 2. New spiculated nodule of the dependent left lower lobe measuring 1.1 x 0.8 cm, most likely infectious or inflammatory given distribution and presence of bronchiolar plugging and other smaller nodules. Attention on follow-up. 3. Severe emphysema and diffuse bilateral bronchial wall thickening. 4. Coronary artery disease. Aortic  Atherosclerosis (ICD10-I70.0) and Emphysema (ICD10-J43.9). Electronically Signed   By: Jearld Lesch M.D.   On: 03/08/2023 16:57   DG Chest 2 View  Result Date: 03/06/2023 CLINICAL DATA:  Shortness of breath. EXAM: CHEST - 2 VIEW COMPARISON:  CT chest dated Jan 25, 2019 and chest radiograph dated March 03, 2019 FINDINGS: The heart size and mediastinal contours are within normal limits. Hyperinflated lungs suggesting moderate emphysema. Chronic left lung volume loss, apical atelectasis and pleural/parenchymal scarring, unchanged. No pleural effusion or pneumothorax. No acute osseous abnormality. IMPRESSION: 1. No active cardiopulmonary disease. 2. Chronic left lung volume loss, apical atelectasis and pleural/parenchymal scarring. COPD Electronically Signed   By: Larose Hires D.O.   On: 03/06/2023 19:56   DG Chest 2 View  Result Date: 03/03/2023 CLINICAL DATA:  Chest pain. Tightness and pressure that radiates to the right and back for the last 2 hours. EXAM: CHEST - 2 VIEW COMPARISON:  02/23/2023 FINDINGS: Hyperinflation. Volume loss along the left hemithorax with persistent density at the left lung apex. Retraction of the hilum. Appearance is similar to previous. Chronic lung changes elsewhere. No pneumothorax, consolidation or effusion otherwise. Normal cardiopericardial silhouette without edema. Film is rotated to the left. Overlapping cardiac leads. Osteopenia degenerative changes along the spine. IMPRESSION: No significant interval change. Stable volume loss along the left hemithorax with chronic lung changes. Left apical opacity with retraction of surrounding tissues including shift of the trachea and mediastinal structures from right to left. Please correlate with clinical history. Electronically Signed   By: Karen Kays M.D.   On: 03/03/2023 18:35   DG Chest Port 1 View  Result Date: 02/23/2023 CLINICAL DATA:  Chest wall pain EXAM: PORTABLE CHEST 1 VIEW COMPARISON:  Chest radiograph 01/29/2023  FINDINGS: Stable cardiac and mediastinal contours. Stable postsurgical changes left upper hemithorax. New patchy consolidative opacities left lower lung. The right lung is clear. No definite pneumothorax. Osseous structures unremarkable. IMPRESSION: New patchy consolidative opacities left lower lung may represent atelectasis or infection. Electronically Signed   By: Annia Belt M.D.   On: 02/23/2023 16:21    Microbiology: No results found for this or any previous visit (from the past 240 hour(s)).   Labs: Basic Metabolic Panel: Recent Labs  Lab 03/03/23 1818 03/06/23 1938 03/07/23 0538 03/09/23 0501  NA 141 142 142 142  K 3.6 2.7* 4.6 4.8  CL 101 114* 107 104  CO2 32 23 30 31   GLUCOSE 113* 84 90 148*  BUN 11 14 15 23   CREATININE 0.58 0.40*  0.62 0.77  CALCIUM 9.1 6.4* 9.1 9.0  MG  --  1.8 1.9 1.9  PHOS  --   --  3.7 3.7   Liver Function Tests: Recent Labs  Lab 03/07/23 0538 03/09/23 0501  AST 13* 13*  ALT 14 13  ALKPHOS 66 62  BILITOT 0.6 0.2*  PROT 6.3* 6.1*  ALBUMIN 3.2* 3.2*   No results for input(s): "LIPASE", "AMYLASE" in the last 168 hours. No results for input(s): "AMMONIA" in the last 168 hours. CBC: Recent Labs  Lab 03/03/23 1818 03/06/23 1938 03/07/23 0538 03/09/23 0501  WBC 12.2* 9.9 6.4 10.5  NEUTROABS  --  6.3 3.5 9.5*  HGB 11.9* 10.5* 11.2* 10.8*  HCT 39.2 35.4* 38.1 34.9*  MCV 92.9 94.7 95.3 93.1  PLT 312 212 222 214   Cardiac Enzymes: No results for input(s): "CKTOTAL", "CKMB", "CKMBINDEX", "TROPONINI" in the last 168 hours. BNP: BNP (last 3 results) Recent Labs    02/23/23 1609 03/03/23 1818 03/06/23 1938  BNP 76.3 76.4 54.6    ProBNP (last 3 results) No results for input(s): "PROBNP" in the last 8760 hours.  CBG: No results for input(s): "GLUCAP" in the last 168 hours.     Signed:  Lacretia Nicks MD.  [facility] 03/09/2023, 4:39 PM

## 2023-03-09 NOTE — Plan of Care (Signed)
  Problem: Clinical Measurements: Goal: Diagnostic test results will improve Outcome: Progressing Goal: Respiratory complications will improve Outcome: Progressing   Problem: Activity: Goal: Risk for activity intolerance will decrease Outcome: Progressing   Problem: Nutrition: Goal: Adequate nutrition will be maintained Outcome: Progressing   Problem: Coping: Goal: Level of anxiety will decrease Outcome: Progressing   Problem: Safety: Goal: Ability to remain free from injury will improve Outcome: Progressing

## 2023-03-10 ENCOUNTER — Other Ambulatory Visit (HOSPITAL_COMMUNITY): Payer: Self-pay

## 2023-03-10 MED ORDER — ALPRAZOLAM 0.5 MG PO TABS
0.5000 mg | ORAL_TABLET | Freq: Two times a day (BID) | ORAL | 0 refills | Status: AC | PRN
  Filled 2023-03-10: qty 10, 5d supply, fill #0

## 2023-03-10 MED ORDER — METOPROLOL SUCCINATE ER 25 MG PO TB24
12.5000 mg | ORAL_TABLET | Freq: Every day | ORAL | 0 refills | Status: DC
  Filled 2023-03-10: qty 15, 30d supply, fill #0

## 2023-03-10 MED ORDER — TAMSULOSIN HCL 0.4 MG PO CAPS
0.4000 mg | ORAL_CAPSULE | Freq: Every morning | ORAL | 0 refills | Status: AC
  Filled 2023-03-10: qty 30, 30d supply, fill #0

## 2023-03-10 MED ORDER — APIXABAN 5 MG PO TABS
5.0000 mg | ORAL_TABLET | Freq: Two times a day (BID) | ORAL | 0 refills | Status: DC
  Filled 2023-03-10: qty 60, 30d supply, fill #0

## 2023-03-10 MED ORDER — PREDNISONE 10 MG PO TABS
ORAL_TABLET | ORAL | 0 refills | Status: AC
  Filled 2023-03-10: qty 30, 12d supply, fill #0

## 2023-03-10 MED ORDER — ROSUVASTATIN CALCIUM 20 MG PO TABS
20.0000 mg | ORAL_TABLET | Freq: Every day | ORAL | 0 refills | Status: DC
  Filled 2023-03-10: qty 30, 30d supply, fill #0

## 2023-03-10 MED ORDER — LOSARTAN POTASSIUM 25 MG PO TABS
12.5000 mg | ORAL_TABLET | Freq: Every morning | ORAL | 0 refills | Status: DC
  Filled 2023-03-10: qty 15, 30d supply, fill #0

## 2023-03-10 MED ORDER — DOXYCYCLINE HYCLATE 100 MG PO CAPS
100.0000 mg | ORAL_CAPSULE | Freq: Two times a day (BID) | ORAL | 0 refills | Status: AC
  Filled 2023-03-10: qty 4, 2d supply, fill #0

## 2023-03-10 NOTE — Progress Notes (Signed)
Mobility Specialist - Progress Note   03/10/23 1017  Oxygen Therapy  SpO2 97 %  O2 Device Nasal Cannula  O2 Flow Rate (L/min) 3 L/min  Patient Activity (if Appropriate) Ambulating  Mobility  Activity Ambulated independently in hallway  Level of Assistance Independent  Assistive Device None  Distance Ambulated (ft) 350 ft  Activity Response Tolerated well  Mobility Referral Yes  $Mobility charge 1 Mobility  Mobility Specialist Start Time (ACUTE ONLY) [id number]  Mobility Specialist Stop Time (ACUTE ONLY) 1008  Mobility Specialist Time Calculation (min) (ACUTE ONLY) 20 min   Pt received in bed and agreeable to mobility. No complaints during session. Pt to bed after session with all needs met.    Pre-mobility: 96% SpO2 During mobility: 97%  SpO2 Post-mobility: 95% SPO2  Chief Technology Officer

## 2023-03-10 NOTE — Progress Notes (Signed)
Didn't discharge yesterday as had no ride. Remains stable today for discharge.  No complaints or concerns.  Needs meds sent to [facility] instead.  Today's Vitals   03/09/23 2052 03/10/23 0324 03/10/23 0935 03/10/23 1017  BP:  121/71    Pulse:  62    Resp:  18    Temp:  97.9 F (36.6 C)    TempSrc:  Oral    SpO2: 99% 100% 96% 97%  Weight:      Height:      PainSc:       Body mass index is 19.29 kg/m.  See discharge summary from 6/30.

## 2023-03-11 ENCOUNTER — Other Ambulatory Visit (HOSPITAL_COMMUNITY): Payer: Self-pay

## 2023-03-26 ENCOUNTER — Encounter (HOSPITAL_COMMUNITY): Payer: Self-pay | Admitting: Internal Medicine

## 2023-03-26 ENCOUNTER — Observation Stay (HOSPITAL_COMMUNITY)
Admission: EM | Admit: 2023-03-26 | Discharge: 2023-03-27 | Disposition: A | Discharge status: Home / Self Care | Payer: 59 | Attending: Internal Medicine | Admitting: Internal Medicine

## 2023-03-26 ENCOUNTER — Other Ambulatory Visit: Payer: Self-pay

## 2023-03-26 ENCOUNTER — Emergency Department (HOSPITAL_COMMUNITY): Payer: 59

## 2023-03-26 DIAGNOSIS — Z9981 Dependence on supplemental oxygen: Secondary | ICD-10-CM | POA: Insufficient documentation

## 2023-03-26 DIAGNOSIS — Z79899 Other long term (current) drug therapy: Secondary | ICD-10-CM | POA: Insufficient documentation

## 2023-03-26 DIAGNOSIS — K219 Gastro-esophageal reflux disease without esophagitis: Secondary | ICD-10-CM | POA: Diagnosis present

## 2023-03-26 DIAGNOSIS — J441 Chronic obstructive pulmonary disease with (acute) exacerbation: Secondary | ICD-10-CM | POA: Diagnosis not present

## 2023-03-26 DIAGNOSIS — Z1152 Encounter for screening for COVID-19: Secondary | ICD-10-CM | POA: Diagnosis not present

## 2023-03-26 DIAGNOSIS — I480 Paroxysmal atrial fibrillation: Secondary | ICD-10-CM | POA: Diagnosis present

## 2023-03-26 DIAGNOSIS — E039 Hypothyroidism, unspecified: Secondary | ICD-10-CM | POA: Diagnosis present

## 2023-03-26 DIAGNOSIS — F17210 Nicotine dependence, cigarettes, uncomplicated: Secondary | ICD-10-CM | POA: Insufficient documentation

## 2023-03-26 DIAGNOSIS — E785 Hyperlipidemia, unspecified: Secondary | ICD-10-CM | POA: Diagnosis present

## 2023-03-26 DIAGNOSIS — J9611 Chronic respiratory failure with hypoxia: Secondary | ICD-10-CM | POA: Diagnosis present

## 2023-03-26 DIAGNOSIS — Z85118 Personal history of other malignant neoplasm of bronchus and lung: Secondary | ICD-10-CM | POA: Diagnosis not present

## 2023-03-26 DIAGNOSIS — Z7901 Long term (current) use of anticoagulants: Secondary | ICD-10-CM | POA: Insufficient documentation

## 2023-03-26 DIAGNOSIS — J9621 Acute and chronic respiratory failure with hypoxia: Secondary | ICD-10-CM | POA: Diagnosis present

## 2023-03-26 DIAGNOSIS — J4489 Other specified chronic obstructive pulmonary disease: Secondary | ICD-10-CM | POA: Diagnosis present

## 2023-03-26 DIAGNOSIS — I10 Essential (primary) hypertension: Secondary | ICD-10-CM | POA: Diagnosis present

## 2023-03-26 DIAGNOSIS — R0602 Shortness of breath: Secondary | ICD-10-CM | POA: Diagnosis present

## 2023-03-26 DIAGNOSIS — I2510 Atherosclerotic heart disease of native coronary artery without angina pectoris: Secondary | ICD-10-CM | POA: Diagnosis not present

## 2023-03-26 LAB — I-STAT CHEM 8, ED
BUN: 13 mg/dL (ref 8–23)
Calcium, Ion: 1.14 mmol/L — ABNORMAL LOW (ref 1.15–1.40)
Chloride: 99 mmol/L (ref 98–111)
Creatinine, Ser: 0.6 mg/dL (ref 0.44–1.00)
Glucose, Bld: 131 mg/dL — ABNORMAL HIGH (ref 70–99)
HCT: 37 % (ref 36.0–46.0)
Hemoglobin: 12.6 g/dL (ref 12.0–15.0)
Potassium: 3.5 mmol/L (ref 3.5–5.1)
Sodium: 140 mmol/L (ref 135–145)
TCO2: 36 mmol/L — ABNORMAL HIGH (ref 22–32)

## 2023-03-26 LAB — RESP PANEL BY RT-PCR (RSV, FLU A&B, COVID)  RVPGX2
Influenza A by PCR: NEGATIVE
Influenza B by PCR: NEGATIVE
Resp Syncytial Virus by PCR: NEGATIVE
SARS Coronavirus 2 by RT PCR: NEGATIVE

## 2023-03-26 LAB — CBC WITH DIFFERENTIAL/PLATELET
Abs Immature Granulocytes: 0.02 10*3/uL (ref 0.00–0.07)
Basophils Absolute: 0 10*3/uL (ref 0.0–0.1)
Basophils Relative: 1 %
Eosinophils Absolute: 0.1 10*3/uL (ref 0.0–0.5)
Eosinophils Relative: 1 %
HCT: 41.2 % (ref 36.0–46.0)
Hemoglobin: 12 g/dL (ref 12.0–15.0)
Immature Granulocytes: 0 %
Lymphocytes Relative: 30 %
Lymphs Abs: 2.7 10*3/uL (ref 0.7–4.0)
MCH: 28.2 pg (ref 26.0–34.0)
MCHC: 29.1 g/dL — ABNORMAL LOW (ref 30.0–36.0)
MCV: 96.9 fL (ref 80.0–100.0)
Monocytes Absolute: 0.5 10*3/uL (ref 0.1–1.0)
Monocytes Relative: 5 %
Neutro Abs: 5.6 10*3/uL (ref 1.7–7.7)
Neutrophils Relative %: 63 %
Platelets: 194 10*3/uL (ref 150–400)
RBC: 4.25 MIL/uL (ref 3.87–5.11)
RDW: 14.3 % (ref 11.5–15.5)
WBC: 8.9 10*3/uL (ref 4.0–10.5)
nRBC: 0 % (ref 0.0–0.2)

## 2023-03-26 LAB — TROPONIN I (HIGH SENSITIVITY)
Troponin I (High Sensitivity): 8 ng/L (ref <18)
Troponin I (High Sensitivity): 6 ng/L (ref <18)

## 2023-03-26 MED ORDER — METHYLPREDNISOLONE SODIUM SUCC 40 MG IJ SOLR
40.0000 mg | Freq: Two times a day (BID) | INTRAMUSCULAR | Status: DC
  Administered 2023-03-26 – 2023-03-27 (×3): 40 mg via INTRAVENOUS
  Filled 2023-03-26 (×3): qty 1

## 2023-03-26 MED ORDER — LOSARTAN POTASSIUM 25 MG PO TABS
12.5000 mg | ORAL_TABLET | Freq: Every day | ORAL | Status: DC
  Administered 2023-03-26 – 2023-03-27 (×2): 12.5 mg via ORAL
  Filled 2023-03-26 (×2): qty 1

## 2023-03-26 MED ORDER — TAMSULOSIN HCL 0.4 MG PO CAPS
0.4000 mg | ORAL_CAPSULE | Freq: Every day | ORAL | Status: DC
  Administered 2023-03-26 – 2023-03-27 (×2): 0.4 mg via ORAL
  Filled 2023-03-26 (×2): qty 1

## 2023-03-26 MED ORDER — ALBUTEROL SULFATE (2.5 MG/3ML) 0.083% IN NEBU
10.0000 mg/h | INHALATION_SOLUTION | Freq: Once | RESPIRATORY_TRACT | Status: AC
  Administered 2023-03-26: 10 mg/h via RESPIRATORY_TRACT
  Filled 2023-03-26: qty 12

## 2023-03-26 MED ORDER — APIXABAN 5 MG PO TABS
5.0000 mg | ORAL_TABLET | Freq: Two times a day (BID) | ORAL | Status: DC
  Administered 2023-03-26 – 2023-03-27 (×3): 5 mg via ORAL
  Filled 2023-03-26 (×3): qty 1

## 2023-03-26 MED ORDER — PANTOPRAZOLE SODIUM 40 MG PO TBEC
40.0000 mg | DELAYED_RELEASE_TABLET | Freq: Every day | ORAL | Status: DC
  Administered 2023-03-26 – 2023-03-27 (×2): 40 mg via ORAL
  Filled 2023-03-26 (×2): qty 1

## 2023-03-26 MED ORDER — IPRATROPIUM-ALBUTEROL 0.5-2.5 (3) MG/3ML IN SOLN
3.0000 mL | Freq: Four times a day (QID) | RESPIRATORY_TRACT | Status: DC
  Administered 2023-03-26 – 2023-03-27 (×3): 3 mL via RESPIRATORY_TRACT
  Filled 2023-03-26 (×3): qty 3

## 2023-03-26 MED ORDER — NITROGLYCERIN 0.4 MG SL SUBL: 0.4000 mg | SUBLINGUAL_TABLET | SUBLINGUAL | Status: DC | PRN

## 2023-03-26 MED ORDER — SODIUM CHLORIDE 0.9 % IV SOLN
1.0000 g | Freq: Once | INTRAVENOUS | Status: AC
  Administered 2023-03-26: 1 g via INTRAVENOUS
  Filled 2023-03-26: qty 10

## 2023-03-26 MED ORDER — METOPROLOL SUCCINATE ER 25 MG PO TB24
12.5000 mg | ORAL_TABLET | Freq: Every day | ORAL | Status: DC
  Administered 2023-03-26 – 2023-03-27 (×2): 12.5 mg via ORAL
  Filled 2023-03-26 (×2): qty 1

## 2023-03-26 MED ORDER — PROPYLENE GLYCOL (PF) 0.6 % OP SOLN: 1.0000 [drp] | Freq: Three times a day (TID) | OPHTHALMIC | Status: DC | PRN

## 2023-03-26 MED ORDER — MAGNESIUM OXIDE 400 MG PO CAPS: 400.0000 mg | ORAL_CAPSULE | Freq: Every morning | ORAL | Status: DC

## 2023-03-26 MED ORDER — POLYVINYL ALCOHOL 1.4 % OP SOLN: 1.0000 [drp] | Freq: Three times a day (TID) | OPHTHALMIC | Status: DC | PRN

## 2023-03-26 MED ORDER — MAGNESIUM OXIDE -MG SUPPLEMENT 400 (240 MG) MG PO TABS
400.0000 mg | ORAL_TABLET | Freq: Every day | ORAL | Status: DC
  Administered 2023-03-26 – 2023-03-27 (×2): 400 mg via ORAL
  Filled 2023-03-26 (×2): qty 1

## 2023-03-26 MED ORDER — ALBUTEROL SULFATE (2.5 MG/3ML) 0.083% IN NEBU
2.5000 mg | INHALATION_SOLUTION | RESPIRATORY_TRACT | Status: DC | PRN
  Administered 2023-03-26: 2.5 mg via RESPIRATORY_TRACT
  Filled 2023-03-26: qty 3

## 2023-03-26 MED ORDER — ONDANSETRON HCL 4 MG/2ML IJ SOLN: 4.0000 mg | Freq: Four times a day (QID) | INTRAMUSCULAR | Status: DC | PRN

## 2023-03-26 MED ORDER — ONDANSETRON HCL 4 MG PO TABS: 4.0000 mg | ORAL_TABLET | Freq: Four times a day (QID) | ORAL | Status: DC | PRN

## 2023-03-26 MED ORDER — SODIUM CHLORIDE 0.9% FLUSH
3.0000 mL | Freq: Two times a day (BID) | INTRAVENOUS | Status: DC
  Administered 2023-03-26 – 2023-03-27 (×2): 3 mL via INTRAVENOUS

## 2023-03-26 MED ORDER — ROSUVASTATIN CALCIUM 20 MG PO TABS
20.0000 mg | ORAL_TABLET | Freq: Every day | ORAL | Status: DC
  Administered 2023-03-26 – 2023-03-27 (×2): 20 mg via ORAL
  Filled 2023-03-26 (×2): qty 1

## 2023-03-26 MED ORDER — ACETAMINOPHEN 650 MG RE SUPP: 650.0000 mg | Freq: Four times a day (QID) | RECTAL | Status: DC | PRN

## 2023-03-26 MED ORDER — FENTANYL CITRATE PF 50 MCG/ML IJ SOSY: 25.0000 ug | PREFILLED_SYRINGE | INTRAMUSCULAR | Status: DC | PRN

## 2023-03-26 MED ORDER — LEVOTHYROXINE SODIUM 25 MCG PO TABS
125.0000 ug | ORAL_TABLET | Freq: Every day | ORAL | Status: DC
  Administered 2023-03-27: 125 ug via ORAL
  Filled 2023-03-26: qty 1

## 2023-03-26 MED ORDER — ACETAMINOPHEN 325 MG PO TABS
650.0000 mg | ORAL_TABLET | Freq: Four times a day (QID) | ORAL | Status: DC | PRN
  Administered 2023-03-27: 650 mg via ORAL
  Filled 2023-03-26: qty 2

## 2023-03-26 NOTE — H&P (Signed)
History and Physical    Patient: Tracy Park:[id number] DOB: Jan 27, 1956 DOA: 03/26/2023 DOS: the patient was seen and examined on 03/26/2023 PCP: Jacquelin Hawking, PA-C  Patient coming from: Home  Chief Complaint:  Chief Complaint  Patient presents with   Shortness of Breath    Pt reports woke up from sleep SOB, pt wheezing in all fields per EMS, 2 duo nebs given, 125 solu medrol and 2g mag given, pt on 2L at baseline. Pt RR unlabored at time of triage. Pt with extensive lung hx    HPI: Tracy Park is a 67 y.o. female with medical history significant of CAD, history neuronal pain, history of my, bipolar disorder, anxiety, PTSD, COPD on home oxygen at 2 L, lung cancer, sleep apnea, nephrolithiasis, hydroureteronephrosis, hypothyroidism, lung cancer who presented to the emergency department with complaints of waking up with dyspnea, wheezing and respiratory distress.  She received 2 DuoNebs, 125 mg of Solu-Medrol and 2 g of magnesium sulfate.  She denied fever, chills, sore throat or hemoptysis.  She has frequent rhinorrhea due to oxygen use.  No chest pain, palpitations, diaphoresis, PND, orthopnea or pitting edema of the lower extremities.  No abdominal pain, nausea, emesis, diarrhea, constipation, melena or hematochezia.  No flank pain, dysuria, frequency or hematuria.  No polyuria, polydipsia, polyphagia or blurred vision.   Lab work: CBC showed a white count of 8.9, hemoglobin 12.0 g/dL platelets 811,  coronavirus, influenza and RSV PCR negative.  Troponin x 2 normal.  I-STAT Chem-8 with a glucose of 131 mg/dL, ionized calcium 9.14 and pCO2 36 mmol/L.  Imaging: Portable 1 view chest radiograph showing hyperexpansion without acute cardiopulmonary findings.  There is an 11 mm spiculated nodule identified in the left lung based on CT chest 03/08/2023 that is not evident on x-ray.   ED course: Initial vital signs were temperature 97.3 F, pulse 99, respiration 26, BP 115/64 mmHg O2  sat 96% on nasal cannula oxygen at 2 LPM.  Patient received albuterol 10 mg via neb, ceftriaxone 1 g IVPB.  Review of Systems: As mentioned in the history of present illness. All other systems reviewed and are negative. Past Medical History:  Diagnosis Date   Anginal pain (HCC)    Anxiety    Bipolar disorder (HCC)    Cancer (HCC)    COPD (chronic obstructive pulmonary disease) (HCC)    Dyspnea    Family history of adverse reaction to anesthesia    History of kidney stones    Hydroureteronephrosis 08/16/2021   Hypothyroidism    Lung cancer (HCC)    Myocardial infarction (HCC)    Paroxysmal atrial fibrillation (HCC)    PTSD (post-traumatic stress disorder)    Sleep apnea    Thyroid disease    Past Surgical History:  Procedure Laterality Date   ABDOMINAL HYSTERECTOMY     BACK SURGERY     CYSTOSCOPY W/ URETERAL STENT PLACEMENT Right 05/03/2021   Procedure: CYSTOSCOPY WITH RETROGRADE PYELOGRAM/URETERAL STENT PLACEMENT;  Surgeon: Crist Fat, MD;  Location: [facility];  Service: Urology;  Laterality: Right;   EYE SURGERY     kidney stent     thyroidectomy     Social History:  reports that she has been smoking cigarettes. She has never used smokeless tobacco. She reports that she does not currently use alcohol. She reports that she does not currently use drugs.  Allergies  Allergen Reactions   Red Dye Hives, Itching and Other (See Comments)    Red food  dye   Oxycontin [Oxycodone Hcl] Other (See Comments)    Hallucinations    Prednisone Hives and Nausea And Vomiting   Aspirin Hives   Strawberry Extract Hives and Itching   Tomato Hives and Itching   Tape Rash and Other (See Comments)    Prefers paper tape   Wound Dressing Adhesive Rash    Family History  Family history unknown: Yes    Prior to Admission medications   Medication Sig Start Date End Date Taking? Authorizing Provider  albuterol (PROVENTIL) (2.5 MG/3ML) 0.083% nebulizer solution Take 3 mLs (2.5 mg total)  by nebulization every 6 (six) hours as needed for wheezing or shortness of breath. Patient taking differently: Take 2.5 mg by nebulization See admin instructions. Nebulize 2.5 mg and inhale into the lungs in the morning and at bedtime 01/30/23   Tilden Fossa, MD  albuterol (VENTOLIN HFA) 108 (90 Base) MCG/ACT inhaler Inhale 2 puffs into the lungs every 6 (six) hours as needed for wheezing or shortness of breath. 01/13/23   Burnadette Pop, MD  apixaban (ELIQUIS) 5 MG TABS tablet Take 1 tablet (5 mg total) by mouth 2 (two) times daily. 03/10/23 04/09/23  Zigmund Daniel., MD  Budeson-Glycopyrrol-Formoterol (BREZTRI AEROSPHERE) 160-9-4.8 MCG/ACT AERO Inhale 1 puff into the lungs in the morning and at bedtime.    [provider]  levothyroxine (SYNTHROID) 125 MCG tablet Take 125 mcg by mouth daily before breakfast.    [provider]  losartan (COZAAR) 25 MG tablet Take 0.5 tablets (12.5 mg total) by mouth in the morning. 03/10/23 04/09/23  Zigmund Daniel., MD  Magnesium Oxide 400 MG CAPS Take 1 capsule (400 mg total) by mouth daily at 12 noon. Patient taking differently: Take 400 mg by mouth in the morning. 03/01/22   Albertine Grates, MD  metoprolol succinate (TOPROL-XL) 25 MG 24 hr tablet Take 0.5 tablets (12.5 mg total) by mouth daily. 03/10/23 04/09/23  Zigmund Daniel., MD  nitroGLYCERIN (NITROSTAT) 0.4 MG SL tablet Place 0.4 mg under the tongue every 5 (five) minutes as needed for chest pain.    [provider]  OXYGEN Inhale 2 L/min into the lungs continuous.    [provider]  pantoprazole (PROTONIX) 40 MG tablet Take 40 mg by mouth daily before breakfast.    [provider]  rosuvastatin (CRESTOR) 20 MG tablet Take 1 tablet (20 mg total) by mouth daily. 03/10/23 04/09/23  Zigmund Daniel., MD  SYSTANE COMPLETE PF 0.6 % SOLN Place 1 drop into both eyes 3 (three) times daily as needed (for dryness).    [provider]  tamsulosin (FLOMAX)  0.4 MG CAPS capsule Take 1 capsule (0.4 mg total) by mouth in the morning. 03/10/23 04/09/23  Zigmund Daniel., MD    Physical Exam: Vitals:   03/26/23 0413 03/26/23 0415 03/26/23 0530 03/26/23 0630  BP:  115/64 (!) 97/59 (!) 104/59  Pulse:  99 95 94  Resp:  (!) 26 (!) 24 (!) 24  Temp:  (!) 97.3 F (36.3 C)    TempSrc:  Oral    SpO2:  96% 94% 100%  Weight: 49.4 kg     Height: 5\' 3"  (1.6 m)      Physical Exam Vitals and nursing note reviewed.  Constitutional:      General: She is awake. She is not in acute distress.    Appearance: She is well-developed.  HENT:     Head: Normocephalic.  Nose: No rhinorrhea.     Mouth/Throat:     Mouth: Mucous membranes are moist.  Eyes:     General: No scleral icterus.    Pupils: Pupils are equal, round, and reactive to light.  Neck:     Vascular: No JVD.  Cardiovascular:     Rate and Rhythm: Normal rate and regular rhythm.     Heart sounds: S1 normal and S2 normal.  Pulmonary:     Effort: Pulmonary effort is normal. Tachypnea present. No accessory muscle usage.     Breath sounds: Decreased breath sounds and wheezing present. No rhonchi or rales.  Abdominal:     General: Bowel sounds are normal.     Palpations: Abdomen is soft.  Musculoskeletal:     Cervical back: Neck supple.     Right lower leg: No edema.     Left lower leg: No edema.  Skin:    General: Skin is warm and dry.  Neurological:     General: No focal deficit present.     Mental Status: She is alert and oriented to person, place, and time.  Psychiatric:        Mood and Affect: Mood normal.        Behavior: Behavior normal. Behavior is cooperative.    Data Reviewed:  Results are pending, will review when available.  01/26/2023 transthoracic echocardiogram IMPRESSIONS:   1. Left ventricular ejection fraction, by estimation, is 60 to 65%. The  left ventricle has normal function. The left ventricle has no regional  wall motion abnormalities. Left ventricular  diastolic parameters were  normal.   2. Right ventricular systolic function is normal. The right ventricular  size is normal. There is mildly elevated pulmonary artery systolic  pressure.   3. The mitral valve is normal in structure. No evidence of mitral valve  regurgitation.   4. The aortic valve is tricuspid. Aortic valve regurgitation is not  visualized.   5. The inferior vena cava is dilated in size with <50% respiratory  variability, suggesting right atrial pressure of 15 mmHg.   EKG: Vent. rate 102 BPM PR interval 134 ms QRS duration 105 ms QT/QTcB 357/465 ms P-R-T axes 93 21 87 Sinus tachycardia Left ventricular hypertrophy  Assessment and Plan: Principal Problem:   COPD with acute exacerbation (HCC) Superimposed on:    Chronic hypoxic respiratory failure (HCC) Observation/telemetry Continue supplemental oxygen. Methylprednisolone 125 mg IVP x1. Followed by prednisone 40 mg p.o. daily in a.m. Scheduled and as needed bronchodilators. Follow-up CBC and chemistry in the morning.   Active Problems:   Paroxysmal atrial fibrillation (HCC) Continue apixaban 5 mg p.o. twice daily. Continue metoprolol succinate 12.5 mg p.o. daily.    GERD (gastroesophageal reflux disease) Continue pantoprazole 40 mg p.o. daily.    Dyslipidemia Continue rosuvastatin 20 mg p.o. daily.    Hypothyroidism Continue levothyroxine 125 mcg p.o. daily.    Coronary artery disease involving native  coronary artery of native heart without angina pectoris On apixaban, metoprolol and rosuvastatin.    Essential hypertension Continue losartan 25 mg p.o. daily. Continue metoprolol succinate 12.5 mg p.o. daily.    Advance Care Planning:   Code Status: DNR   Consults:   Family Communication:   Severity of Illness: The appropriate patient status for this patient is OBSERVATION. Observation status is judged to be reasonable and necessary in order to provide the required intensity of service to  ensure the patient's safety. The patient's presenting symptoms, physical exam findings, and initial radiographic and laboratory  data in the context of their medical condition is felt to place them at decreased risk for further clinical deterioration. Furthermore, it is anticipated that the patient will be medically stable for discharge from the hospital within 2 midnights of admission.   Author: Bobette Mo, MD 03/26/2023 7:19 AM  For on call review [email or web address].   This document was prepared using Dragon voice recognition software and may contain some unintended transcription errors.

## 2023-03-26 NOTE — ED Notes (Signed)
ED TO INPATIENT HANDOFF REPORT  ED Nurse Name and Phone #: Tia, RN  S Name/Age/Gender Tracy Park 67 y.o. female Room/Bed: WA18/WA18  Code Status   Code Status: Prior  Home/SNF/Other Home Patient oriented to: self, place, time, and situation Is this baseline? Yes   Triage Complete: Triage complete  Chief Complaint COPD with acute exacerbation (HCC) [J44.1]  Triage Note No notes on file   Allergies Allergies  Allergen Reactions   Red Dye Hives, Itching and Other (See Comments)    Red food dye   Oxycontin [Oxycodone Hcl] Other (See Comments)    Hallucinations    Prednisone Hives and Nausea And Vomiting   Aspirin Hives   Strawberry Extract Hives and Itching   Tomato Hives and Itching   Tape Rash and Other (See Comments)    Prefers paper tape   Wound Dressing Adhesive Rash    Level of Care/Admitting Diagnosis ED Disposition     ED Disposition  Admit   Condition  --   Comment  Hospital Area: [facility] [[id number]]  Level of Care: Telemetry [5]  Admit to tele based on following criteria: Complex arrhythmia (Bradycardia/Tachycardia)  May place patient in observation at [facility] or [facility] if equivalent level of care is available:: No  Covid Evaluation: Asymptomatic - no recent exposure (last 10 days) testing not required  Diagnosis: COPD with acute exacerbation [facility]) [[id number]]  Admitting Physician: Bobette Mo [[id number]]  Attending Physician: Bobette Mo [[id number]]          B Medical/Surgery History Past Medical History:  Diagnosis Date   Anginal pain (HCC)    Anxiety    Bipolar disorder (HCC)    Cancer (HCC)    COPD (chronic obstructive pulmonary disease) (HCC)    Dyspnea    Family history of adverse reaction to anesthesia    History of kidney stones    Hydroureteronephrosis 08/16/2021   Hypothyroidism    Lung cancer (HCC)    Myocardial infarction (HCC)    Paroxysmal atrial fibrillation (HCC)    PTSD  (post-traumatic stress disorder)    Sleep apnea    Thyroid disease    Past Surgical History:  Procedure Laterality Date   ABDOMINAL HYSTERECTOMY     BACK SURGERY     CYSTOSCOPY W/ URETERAL STENT PLACEMENT Right 05/03/2021   Procedure: CYSTOSCOPY WITH RETROGRADE PYELOGRAM/URETERAL STENT PLACEMENT;  Surgeon: Crist Fat, MD;  Location: [facility];  Service: Urology;  Laterality: Right;   EYE SURGERY     kidney stent     thyroidectomy       A IV Location/Drains/Wounds Patient Lines/Drains/Airways Status     Active Line/Drains/Airways     Name Placement date Placement time Site Days   Peripheral IV 03/26/23 20 G Right Antecubital 03/26/23  0501  Antecubital  less than 1            Intake/Output Last 24 hours  Intake/Output Summary (Last 24 hours) at 03/26/2023 0749 Last data filed at 03/26/2023 0644 Gross per 24 hour  Intake 100.58 ml  Output --  Net 100.58 ml    Labs/Imaging Results for orders placed or performed during the hospital encounter of 03/26/23 (from the past 48 hour(s))  CBC with Differential     Status: Abnormal   Collection Time: 03/26/23  4:30 AM  Result Value Ref Range   WBC 8.9 4.0 - 10.5 K/uL   RBC 4.25 3.87 - 5.11 MIL/uL   Hemoglobin 12.0 12.0 - 15.0  g/dL   HCT 40.9 81.1 - 91.4 %   MCV 96.9 80.0 - 100.0 fL   MCH 28.2 26.0 - 34.0 pg   MCHC 29.1 (L) 30.0 - 36.0 g/dL   RDW 78.2 95.6 - 21.3 %   Platelets 194 150 - 400 K/uL   nRBC 0.0 0.0 - 0.2 %   Neutrophils Relative % 63 %   Neutro Abs 5.6 1.7 - 7.7 K/uL   Lymphocytes Relative 30 %   Lymphs Abs 2.7 0.7 - 4.0 K/uL   Monocytes Relative 5 %   Monocytes Absolute 0.5 0.1 - 1.0 K/uL   Eosinophils Relative 1 %   Eosinophils Absolute 0.1 0.0 - 0.5 K/uL   Basophils Relative 1 %   Basophils Absolute 0.0 0.0 - 0.1 K/uL   Immature Granulocytes 0 %   Abs Immature Granulocytes 0.02 0.00 - 0.07 K/uL    Comment: Performed at [facility], [address] [place], [place] [address]   Troponin I (High Sensitivity)     Status: None   Collection Time: 03/26/23  4:30 AM  Result Value Ref Range   Troponin I (High Sensitivity) 8 <18 ng/L    Comment: (NOTE) Elevated high sensitivity troponin I (hsTnI) values and significant  changes across serial measurements may suggest ACS but many other  chronic and acute conditions are known to elevate hsTnI results.  Refer to the "Links" section for chest pain algorithms and additional  guidance. Performed at [facility], [address], [place], [place] [address]   Resp panel by RT-PCR (RSV, Flu A&B, Covid) Anterior Nasal Swab     Status: None   Collection Time: 03/26/23  5:21 AM   Specimen: Anterior Nasal Swab  Result Value Ref Range   SARS Coronavirus 2 by RT PCR NEGATIVE NEGATIVE    Comment: (NOTE) SARS-CoV-2 target nucleic acids are NOT DETECTED.  The SARS-CoV-2 RNA is generally detectable in upper respiratory specimens during the acute phase of infection. The lowest concentration of SARS-CoV-2 viral copies this assay can detect is 138 copies/mL. A negative result does not preclude SARS-Cov-2 infection and should not be used as the sole basis for treatment or other patient management decisions. A negative result may occur with  improper specimen collection/handling, submission of specimen other than nasopharyngeal swab, presence of viral mutation(s) within the areas targeted by this assay, and inadequate number of viral copies(<138 copies/mL). A negative result must be combined with clinical observations, patient history, and epidemiological information. The expected result is Negative.  Fact Sheet for Patients:  BloggerCourse.com  Fact Sheet for Healthcare Providers:  SeriousBroker.it  This test is no t yet approved or cleared by the [place] FDA and  has been authorized for detection and/or diagnosis of SARS-CoV-2 by FDA under an Emergency  Use Authorization (EUA). This EUA will remain  in effect (meaning this test can be used) for the duration of the COVID-19 declaration under Section 564(b)(1) of the Act, 21 U.S.C.section 360bbb-3(b)(1), unless the authorization is terminated  or revoked sooner.       Influenza A by PCR NEGATIVE NEGATIVE   Influenza B by PCR NEGATIVE NEGATIVE    Comment: (NOTE) The Xpert Xpress SARS-CoV-2/FLU/RSV plus assay is intended as an aid in the diagnosis of influenza from Nasopharyngeal swab specimens and should not be used as a sole basis for treatment. Nasal washings and aspirates are unacceptable for Xpert Xpress SARS-CoV-2/FLU/RSV testing.  Fact Sheet for Patients: BloggerCourse.com  Fact Sheet for Healthcare Providers:  SeriousBroker.it  This test is not yet approved or cleared by the [place] and has been authorized for detection and/or diagnosis of SARS-CoV-2 by FDA under an Emergency Use Authorization (EUA). This EUA will remain in effect (meaning this test can be used) for the duration of the COVID-19 declaration under Section 564(b)(1) of the Act, 21 U.S.C. section 360bbb-3(b)(1), unless the authorization is terminated or revoked.     Resp Syncytial Virus by PCR NEGATIVE NEGATIVE    Comment: (NOTE) Fact Sheet for Patients: BloggerCourse.com  Fact Sheet for Healthcare Providers: SeriousBroker.it  This test is not yet approved or cleared by the [place] FDA and has been authorized for detection and/or diagnosis of SARS-CoV-2 by FDA under an Emergency Use Authorization (EUA). This EUA will remain in effect (meaning this test can be used) for the duration of the COVID-19 declaration under Section 564(b)(1) of the Act, 21 U.S.C. section 360bbb-3(b)(1), unless the authorization is terminated or revoked.  Performed at [facility], [address], [place], [place] [address]   I-stat chem 8, ED (not at [facility], [facility] or [facility])     Status: Abnormal   Collection Time: 03/26/23  5:36 AM  Result Value Ref Range   Sodium 140 135 - 145 mmol/L   Potassium 3.5 3.5 - 5.1 mmol/L   Chloride 99 98 - 111 mmol/L   BUN 13 8 - 23 mg/dL   Creatinine, Ser 2.59 0.44 - 1.00 mg/dL   Glucose, Bld 563 (H) 70 - 99 mg/dL    Comment: Glucose reference range applies only to samples taken after fasting for at least 8 hours.   Calcium, Ion 1.14 (L) 1.15 - 1.40 mmol/L   TCO2 36 (H) 22 - 32 mmol/L   Hemoglobin 12.6 12.0 - 15.0 g/dL   HCT 87.5 64.3 - 32.9 %   DG Chest Portable 1 View  Result Date: 03/26/2023 CLINICAL DATA:  Shortness of breath. EXAM: PORTABLE CHEST 1 VIEW COMPARISON:  03/06/2023 FINDINGS: Stable volume loss with scarring in the left apex. Lungs are hyperexpanded. The lungs are clear without focal pneumonia, edema, pneumothorax or pleural effusion. The cardiopericardial silhouette is within normal limits for size. No acute bony abnormality. Telemetry leads overlie the chest. IMPRESSION: Hyperexpansion without acute cardiopulmonary findings. 11 mm spiculated nodule identified in the left lung base on CT chest 03/08/2023 is not evident by x-ray today. Electronically Signed   By: Kennith Center M.D.   On: 03/26/2023 06:20    Pending Labs Unresulted Labs (From admission, onward)    None       Vitals/Pain Today's Vitals   03/26/23 0415 03/26/23 0530 03/26/23 0630 03/26/23 0747  BP: 115/64 (!) 97/59 (!) 104/59   Pulse: 99 95 94   Resp: (!) 26 (!) 24 (!) 24   Temp: (!) 97.3 F (36.3 C)     TempSrc: Oral     SpO2: 96% 94% 100%   Weight:      Height:      PainSc:    0-No pain    Isolation Precautions No active isolations  Medications Medications  albuterol (PROVENTIL) (2.5 MG/3ML) 0.083% nebulizer solution 2.5 mg (has no administration in time range)  methylPREDNISolone sodium succinate (SOLU-MEDROL) 40 mg/mL injection 40 mg (has no  administration in time range)  albuterol (PROVENTIL) (2.5 MG/3ML) 0.083% nebulizer solution (10 mg/hr Nebulization Given 03/26/23 0530)  cefTRIAXone (ROCEPHIN) 1 g in sodium chloride 0.9 % 100 mL IVPB (0 g Intravenous Stopped 03/26/23 0644)  Mobility walks     Focused Assessments Neuro Assessment Handoff:  Swallow screen pass?  N/A         Neuro Assessment: Within Defined Limits Neuro Checks:      Has TPA been given? No If patient is a Neuro Trauma and patient is going to OR before floor call report to 4N Charge nurse: [phone] or [phone]   R Recommendations: See Admitting Provider Note  Report given to:   Additional Notes: O2 via [place] on 2L

## 2023-03-26 NOTE — Progress Notes (Signed)
Mobility Specialist - Progress Note   03/26/23 1344  Mobility  Activity Ambulated independently in hallway  Level of Assistance Modified independent, requires aide device or extra time  Assistive Device None  Distance Ambulated (ft) 200 ft  Range of Motion/Exercises Active  Activity Response Tolerated fair  Mobility Referral Yes  $Mobility charge 1 Mobility  Mobility Specialist Start Time (ACUTE ONLY) 1333  Mobility Specialist Stop Time (ACUTE ONLY) 1343  Mobility Specialist Time Calculation (min) (ACUTE ONLY) 10 min   Pt was found in bed and agreeable to ambulate. Had productive cough towards EOS. At EOS was left sitting EOB with all needs met and call bell in reach.  Billey Chang Mobility Specialist

## 2023-03-26 NOTE — ED Provider Notes (Signed)
[place] EMERGENCY DEPARTMENT AT [facility] Provider Note   CSN: [id number] Arrival date & time: 03/26/23  0405     History  Chief Complaint  Patient presents with   Shortness of Breath    Pt reports woke up from sleep SOB, pt wheezing in all fields per EMS, 2 duo nebs given, 125 solu medrol and 2g mag given, pt on 2L at baseline. Pt RR unlabored at time of triage. Pt with extensive lung hx     Tracy Park is a 67 y.o. female.  The history is provided by the patient.  Shortness of Breath Severity:  Severe Onset quality:  Gradual Timing:  Constant Progression:  Worsening Chronicity:  Recurrent Context: not weather changes   Relieved by:  Nothing Worsened by:  Nothing Ineffective treatments:  None tried Associated symptoms: wheezing   Associated symptoms: no chest pain, no fever and no vomiting   Patient with COPD and bipolar disorder and ongoing tobacco use presents awakening for wheezing and SOB.      Past Medical History:  Diagnosis Date   Anginal pain (HCC)    Anxiety    Bipolar disorder (HCC)    Cancer (HCC)    COPD (chronic obstructive pulmonary disease) (HCC)    Dyspnea    Family history of adverse reaction to anesthesia    History of kidney stones    Hydroureteronephrosis 08/16/2021   Hypothyroidism    Lung cancer (HCC)    Myocardial infarction (HCC)    Paroxysmal atrial fibrillation (HCC)    PTSD (post-traumatic stress disorder)    Sleep apnea    Thyroid disease      Home Medications Prior to Admission medications   Medication Sig Start Date End Date Taking? Authorizing Provider  albuterol (PROVENTIL) (2.5 MG/3ML) 0.083% nebulizer solution Take 3 mLs (2.5 mg total) by nebulization every 6 (six) hours as needed for wheezing or shortness of breath. Patient taking differently: Take 2.5 mg by nebulization See admin instructions. Nebulize 2.5 mg and inhale into the lungs in the morning and at bedtime 01/30/23   Tilden Fossa, MD   albuterol (VENTOLIN HFA) 108 (90 Base) MCG/ACT inhaler Inhale 2 puffs into the lungs every 6 (six) hours as needed for wheezing or shortness of breath. 01/13/23   Burnadette Pop, MD  apixaban (ELIQUIS) 5 MG TABS tablet Take 1 tablet (5 mg total) by mouth 2 (two) times daily. 03/10/23 04/09/23  Zigmund Daniel., MD  Budeson-Glycopyrrol-Formoterol (BREZTRI AEROSPHERE) 160-9-4.8 MCG/ACT AERO Inhale 1 puff into the lungs in the morning and at bedtime.    [provider]  levothyroxine (SYNTHROID) 125 MCG tablet Take 125 mcg by mouth daily before breakfast.    [provider]  losartan (COZAAR) 25 MG tablet Take 0.5 tablets (12.5 mg total) by mouth in the morning. 03/10/23 04/09/23  Zigmund Daniel., MD  Magnesium Oxide 400 MG CAPS Take 1 capsule (400 mg total) by mouth daily at 12 noon. Patient taking differently: Take 400 mg by mouth in the morning. 03/01/22   Albertine Grates, MD  metoprolol succinate (TOPROL-XL) 25 MG 24 hr tablet Take 0.5 tablets (12.5 mg total) by mouth daily. 03/10/23 04/09/23  Zigmund Daniel., MD  nitroGLYCERIN (NITROSTAT) 0.4 MG SL tablet Place 0.4 mg under the tongue every 5 (five) minutes as needed for chest pain.    [provider]  OXYGEN Inhale 2 L/min into the lungs continuous.    [provider]  pantoprazole (PROTONIX) 40  MG tablet Take 40 mg by mouth daily before breakfast.    [provider]  rosuvastatin (CRESTOR) 20 MG tablet Take 1 tablet (20 mg total) by mouth daily. 03/10/23 04/09/23  Zigmund Daniel., MD  SYSTANE COMPLETE PF 0.6 % SOLN Place 1 drop into both eyes 3 (three) times daily as needed (for dryness).    [provider]  tamsulosin (FLOMAX) 0.4 MG CAPS capsule Take 1 capsule (0.4 mg total) by mouth in the morning. 03/10/23 04/09/23  Zigmund Daniel., MD      Allergies    Red dye, Oxycontin [oxycodone hcl], Prednisone, Aspirin, Strawberry extract, Tomato, Tape, and Wound dressing adhesive     Review of Systems   Review of Systems  Constitutional:  Negative for fever.  HENT:  Negative for facial swelling.   Respiratory:  Positive for shortness of breath and wheezing. Negative for stridor.   Cardiovascular:  Negative for chest pain.  Gastrointestinal:  Negative for vomiting.  All other systems reviewed and are negative.   Physical Exam Updated Vital Signs BP (!) 97/59 (BP Location: Right Arm)   Pulse 95   Temp (!) 97.3 F (36.3 C) (Oral)   Resp (!) 24   Ht 5\' 3"  (1.6 m)   Wt 49.4 kg   SpO2 94%   BMI 19.29 kg/m  Physical Exam Vitals and nursing note reviewed.  Constitutional:      General: She is not in acute distress.    Appearance: She is well-developed.  HENT:     Head: Normocephalic and atraumatic.     Nose: Nose normal.  Eyes:     Pupils: Pupils are equal, round, and reactive to light.  Cardiovascular:     Rate and Rhythm: Normal rate and regular rhythm.     Pulses: Normal pulses.     Heart sounds: Normal heart sounds.  Pulmonary:     Effort: Tachypnea present. No respiratory distress.     Breath sounds: Decreased breath sounds and wheezing present.  Abdominal:     General: Bowel sounds are normal. There is no distension.     Palpations: Abdomen is soft.     Tenderness: There is no abdominal tenderness. There is no guarding or rebound.  Genitourinary:    Vagina: No vaginal discharge.  Musculoskeletal:        General: Normal range of motion.     Cervical back: Neck supple.  Skin:    General: Skin is warm and dry.     Capillary Refill: Capillary refill takes less than 2 seconds.     Findings: No erythema or rash.  Neurological:     General: No focal deficit present.     Mental Status: She is oriented to person, place, and time.     Deep Tendon Reflexes: Reflexes normal.  Psychiatric:        Mood and Affect: Mood normal.     ED Results / Procedures / Treatments   Labs (all labs ordered are listed, but only abnormal results are  displayed) Results for orders placed or performed during the hospital encounter of 03/26/23  Resp panel by RT-PCR (RSV, Flu A&B, Covid) Anterior Nasal Swab   Specimen: Anterior Nasal Swab  Result Value Ref Range   SARS Coronavirus 2 by RT PCR NEGATIVE NEGATIVE   Influenza A by PCR NEGATIVE NEGATIVE   Influenza B by PCR NEGATIVE NEGATIVE   Resp Syncytial Virus by PCR NEGATIVE NEGATIVE  CBC with Differential  Result Value Ref Range  WBC 8.9 4.0 - 10.5 K/uL   RBC 4.25 3.87 - 5.11 MIL/uL   Hemoglobin 12.0 12.0 - 15.0 g/dL   HCT 21.3 08.6 - 57.8 %   MCV 96.9 80.0 - 100.0 fL   MCH 28.2 26.0 - 34.0 pg   MCHC 29.1 (L) 30.0 - 36.0 g/dL   RDW 46.9 62.9 - 52.8 %   Platelets 194 150 - 400 K/uL   nRBC 0.0 0.0 - 0.2 %   Neutrophils Relative % 63 %   Neutro Abs 5.6 1.7 - 7.7 K/uL   Lymphocytes Relative 30 %   Lymphs Abs 2.7 0.7 - 4.0 K/uL   Monocytes Relative 5 %   Monocytes Absolute 0.5 0.1 - 1.0 K/uL   Eosinophils Relative 1 %   Eosinophils Absolute 0.1 0.0 - 0.5 K/uL   Basophils Relative 1 %   Basophils Absolute 0.0 0.0 - 0.1 K/uL   Immature Granulocytes 0 %   Abs Immature Granulocytes 0.02 0.00 - 0.07 K/uL  I-stat chem 8, ED (not at [facility], [facility] or [facility])  Result Value Ref Range   Sodium 140 135 - 145 mmol/L   Potassium 3.5 3.5 - 5.1 mmol/L   Chloride 99 98 - 111 mmol/L   BUN 13 8 - 23 mg/dL   Creatinine, Ser 4.13 0.44 - 1.00 mg/dL   Glucose, Bld 244 (H) 70 - 99 mg/dL   Calcium, Ion 0.10 (L) 1.15 - 1.40 mmol/L   TCO2 36 (H) 22 - 32 mmol/L   Hemoglobin 12.6 12.0 - 15.0 g/dL   HCT 27.2 53.6 - 64.4 %  Troponin I (High Sensitivity)  Result Value Ref Range   Troponin I (High Sensitivity) 8 <18 ng/L   DG Chest Portable 1 View  Result Date: 03/26/2023 CLINICAL DATA:  Shortness of breath. EXAM: PORTABLE CHEST 1 VIEW COMPARISON:  03/06/2023 FINDINGS: Stable volume loss with scarring in the left apex. Lungs are hyperexpanded. The lungs are clear without focal pneumonia, edema, pneumothorax  or pleural effusion. The cardiopericardial silhouette is within normal limits for size. No acute bony abnormality. Telemetry leads overlie the chest. IMPRESSION: Hyperexpansion without acute cardiopulmonary findings. 11 mm spiculated nodule identified in the left lung base on CT chest 03/08/2023 is not evident by x-ray today. Electronically Signed   By: Kennith Center M.D.   On: 03/26/2023 06:20   CT CHEST WO CONTRAST  Result Date: 03/08/2023 CLINICAL DATA:  Respiratory illness, lung cancer, status post left upper lobectomy * Tracking Code: BO * EXAM: CT CHEST WITHOUT CONTRAST TECHNIQUE: Multidetector CT imaging of the chest was performed following the standard protocol without IV contrast. RADIATION DOSE REDUCTION: This exam was performed according to the departmental dose-optimization program which includes automated exposure control, adjustment of the mA and/or kV according to patient size and/or use of iterative reconstruction technique. COMPARISON:  01/25/2023 FINDINGS: Cardiovascular: Aortic atherosclerosis. Normal heart size. Left and right coronary artery calcifications. No pericardial effusion. Mediastinum/Nodes: No enlarged mediastinal, hilar, or axillary lymph nodes. Thyroid gland, trachea, and esophagus demonstrate no significant findings. Lungs/Pleura: Status post left upper lobectomy. Severe emphysema. Diffuse bilateral bronchial wall thickening. New spiculated nodule of the dependent left lower lobe measuring 1.1 x 0.8 cm (series 6, image 110). Occasional bronchiolar plugging and clustered centrilobular and tree-in-bud nodularity also in the dependent left lower lobe (series 6, image 101). No pleural effusion or pneumothorax. Upper Abdomen: No acute abnormality. Musculoskeletal: No chest wall abnormality. No acute osseous findings. IMPRESSION: 1. Status post left upper lobectomy. 2. New spiculated  nodule of the dependent left lower lobe measuring 1.1 x 0.8 cm, most likely infectious or inflammatory  given distribution and presence of bronchiolar plugging and other smaller nodules. Attention on follow-up. 3. Severe emphysema and diffuse bilateral bronchial wall thickening. 4. Coronary artery disease. Aortic Atherosclerosis (ICD10-I70.0) and Emphysema (ICD10-J43.9). Electronically Signed   By: Jearld Lesch M.D.   On: 03/08/2023 16:57   DG Chest 2 View  Result Date: 03/06/2023 CLINICAL DATA:  Shortness of breath. EXAM: CHEST - 2 VIEW COMPARISON:  CT chest dated Jan 25, 2019 and chest radiograph dated March 03, 2019 FINDINGS: The heart size and mediastinal contours are within normal limits. Hyperinflated lungs suggesting moderate emphysema. Chronic left lung volume loss, apical atelectasis and pleural/parenchymal scarring, unchanged. No pleural effusion or pneumothorax. No acute osseous abnormality. IMPRESSION: 1. No active cardiopulmonary disease. 2. Chronic left lung volume loss, apical atelectasis and pleural/parenchymal scarring. COPD Electronically Signed   By: Larose Hires D.O.   On: 03/06/2023 19:56   DG Chest 2 View  Result Date: 03/03/2023 CLINICAL DATA:  Chest pain. Tightness and pressure that radiates to the right and back for the last 2 hours. EXAM: CHEST - 2 VIEW COMPARISON:  02/23/2023 FINDINGS: Hyperinflation. Volume loss along the left hemithorax with persistent density at the left lung apex. Retraction of the hilum. Appearance is similar to previous. Chronic lung changes elsewhere. No pneumothorax, consolidation or effusion otherwise. Normal cardiopericardial silhouette without edema. Film is rotated to the left. Overlapping cardiac leads. Osteopenia degenerative changes along the spine. IMPRESSION: No significant interval change. Stable volume loss along the left hemithorax with chronic lung changes. Left apical opacity with retraction of surrounding tissues including shift of the trachea and mediastinal structures from right to left. Please correlate with clinical history. Electronically  Signed   By: Karen Kays M.D.   On: 03/03/2023 18:35    EKG EKG Interpretation Date/Time:  Wednesday March 26 2023 04:14:13 EDT Ventricular Rate:  102 PR Interval:  134 QRS Duration:  105 QT Interval:  357 QTC Calculation: 465 R Axis:   21  Text Interpretation: Sinus tachycardia Left ventricular hypertrophy Confirmed by Kiyan Burmester ([id number]) on 03/26/2023 5:44:26 AM  Radiology DG Chest Portable 1 View  Result Date: 03/26/2023 CLINICAL DATA:  Shortness of breath. EXAM: PORTABLE CHEST 1 VIEW COMPARISON:  03/06/2023 FINDINGS: Stable volume loss with scarring in the left apex. Lungs are hyperexpanded. The lungs are clear without focal pneumonia, edema, pneumothorax or pleural effusion. The cardiopericardial silhouette is within normal limits for size. No acute bony abnormality. Telemetry leads overlie the chest. IMPRESSION: Hyperexpansion without acute cardiopulmonary findings. 11 mm spiculated nodule identified in the left lung base on CT chest 03/08/2023 is not evident by x-ray today. Electronically Signed   By: Kennith Center M.D.   On: 03/26/2023 06:20    Procedures Procedures    Medications Ordered in ED Medications  albuterol (PROVENTIL) (2.5 MG/3ML) 0.083% nebulizer solution 2.5 mg (has no administration in time range)  methylPREDNISolone sodium succinate (SOLU-MEDROL) 40 mg/mL injection 40 mg (has no administration in time range)  albuterol (PROVENTIL) (2.5 MG/3ML) 0.083% nebulizer solution (10 mg/hr Nebulization Given 03/26/23 0530)  cefTRIAXone (ROCEPHIN) 1 g in sodium chloride 0.9 % 100 mL IVPB (1 g Intravenous New Bag/Given 03/26/23 0602)    ED Course/ Medical Decision Making/ A&P                             Medical Decision Making  Patient with sudden onset SOB and wheezing and was given a neb and solumedrol   Amount and/or Complexity of Data Reviewed Independent Historian: EMS    Details: See above  External Data Reviewed: notes.    Details: Previous  Labs: ordered.     Details: Troponin normal 8, negative covid and flu, normal white count 8.9, normal hemoglobin 12, normal platelets 194.  Normal sodium 140, normal potassium 3.5, normal creatinine  Radiology: ordered and independent interpretation performed.    Details: No PNA by me  ECG/medicine tests: ordered and independent interpretation performed. Decision-making details documented in ED Course. Discussion of management or test interpretation with external provider(s): Dr. Joneen Roach who will admit to medicine   Risk Prescription drug management. Decision regarding hospitalization. Risk Details: Completed continuous neb in the ED, rocephin given for COPD exacerbation and will admit to medicine   Critical Care Total time providing critical care: 30 minutes (CAT and bed side care )    Final Clinical Impression(s) / ED Diagnoses Final diagnoses:  COPD exacerbation (HCC)   The patient appears reasonably stabilized for admission considering the current resources, flow, and capabilities available in the ED at this time, and I doubt any other [facility] requiring further screening and/or treatment in the ED prior to admission.  Rx / DC Orders ED Discharge Orders     None         Aivan Fillingim, MD 03/26/23 [phone]

## 2023-03-26 NOTE — TOC Initial Note (Signed)
Transition of Care [facility]) - Initial/Assessment Note    Patient Details  Name: [place] Park MRN: [id number] Date of Birth: 01-Jan-1956  Transition of Care [facility]) CM/SW Contact:    Lanier Clam, RN Phone Number: 03/26/2023, 1:33 PM  Clinical Narrative:Patient's d/c plan home-has home 02-[employer];PTAR needed @ d/c.                   Expected Discharge Plan: Home/Self Care Barriers to Discharge: Continued Medical Work up   Patient Goals and CMS Choice Patient states their goals for this hospitalization and ongoing recovery are:: Home CMS Medicare.gov Compare Post Acute Care list provided to:: Patient Choice offered to / list presented to : Patient [place] ownership interest in [facility].provided to:: Patient    Expected Discharge Plan and Services   Discharge Planning Services: CM Consult Post Acute Care Choice: Resumption of Svcs/PTA Provider Living arrangements for the past 2 months: Single Family Home                                      Prior Living Arrangements/Services Living arrangements for the past 2 months: Single Family Home   Patient language and need for interpreter reviewed:: Yes Do you feel safe going back to the place where you live?: Yes      Need for Family Participation in Patient Care: Yes (Comment) Care giver support system in place?: Yes (comment) Current home services: DME ([employer] home 02) Criminal Activity/Legal Involvement Pertinent to Current Situation/Hospitalization: No - Comment as needed  Activities of Daily Living Home Assistive Devices/Equipment: Eyeglasses, Dentures (specify type), Oxygen, Cane (specify quad or straight) ADL Screening (condition at time of admission) Patient's cognitive ability adequate to safely complete daily activities?: Yes Is the patient deaf or have difficulty hearing?: No Does the patient have difficulty seeing, even when wearing glasses/contacts?: No Does the patient have difficulty  concentrating, remembering, or making decisions?: No Patient able to express need for assistance with ADLs?: No Does the patient have difficulty dressing or bathing?: No Independently performs ADLs?: Yes (appropriate for developmental age) Does the patient have difficulty walking or climbing stairs?: No Weakness of Legs: None Weakness of Arms/Hands: None  Permission Sought/Granted Permission sought to share information with : Case Manager Permission granted to share information with : Yes, Verbal Permission Granted  Share Information with NAME: ase Manager           Emotional Assessment Appearance:: Appears stated age Attitude/Demeanor/Rapport: Gracious Affect (typically observed): Accepting Orientation: : Oriented to Self, Oriented to Place, Oriented to  Time, Oriented to Situation Alcohol / Substance Use: Not Applicable Psych Involvement: No (comment)  Admission diagnosis:  COPD exacerbation (HCC) [J44.1] COPD with acute exacerbation (HCC) [J44.1] Patient Active Problem List   Diagnosis Date Noted   Acute exacerbation of chronic obstructive pulmonary disease (COPD) (HCC) 03/06/2023   Hypocalcemia 03/06/2023   GAD (generalized anxiety disorder) 03/06/2023   Anemia of chronic disease 03/06/2023   Paroxysmal atrial fibrillation (HCC) 02/23/2023   Atrial fibrillation with RVR (HCC) 01/25/2023   Pulmonary nodule 01/25/2023   COPD (chronic obstructive pulmonary disease) (HCC) 01/25/2023   Essential hypertension 01/25/2023   Thrombocytosis 03/26/2022   COPD exacerbation (HCC) 03/25/2022   DDD (degenerative disc disease), cervical 03/24/2022   Bipolar 2 disorder (HCC) 03/24/2022   Septic shock (HCC) 03/14/2022   Sepsis due to pneumonia (HCC) 03/13/2022   Dyslipidemia 03/13/2022   Anxiety and  depression 03/13/2022   History of lung cancer 03/13/2022   Tobacco abuse 03/13/2022   Pneumonia 02/27/2022   Adenocarcinoma of lung (HCC) 02/26/2022   Leukocytosis 02/26/2022   COPD  with acute exacerbation (HCC) 02/26/2022   Chronic hypoxic respiratory failure (HCC) 02/26/2022   Normocytic anemia 02/08/2022   Protein-calorie malnutrition, severe 08/25/2021   Influenza A with pneumonia 08/16/2021   Sepsis (HCC) 08/16/2021   Hyponatremia 08/16/2021   Hypokalemia 08/16/2021   Hypomagnesemia 08/16/2021   Hydronephrosis 05/03/2021   Acute encephalopathy 05/02/2021   CAP (community acquired pneumonia) 04/10/2020   Coronary artery disease involving native coronary artery of native heart without angina pectoris 08/18/2018   Chronic bilateral low back pain without sciatica 08/21/2016   Hyperlipidemia 06/03/2013   GERD (gastroesophageal reflux disease) 04/28/2012   Acquired hypothyroidism 04/28/2012   Nicotine abuse 04/28/2012   Depression 04/28/2012   Chest pain, atypical 04/28/2012   PCP:  Jacquelin Hawking, PA-C Pharmacy:   [facility] [phone] - [address], [place] - [address] [place] [address] Phone: [phone] Fax: [phone]  [place] [facility] [address] [place] [address] Phone: [phone] Fax: [phone]  [id number] - [place], [place] - [address] AT [facility] OF [address] [place] [address] Phone: [phone] Fax: [phone]  [id number] [place], [place] - [address] AT [facility] OF [facility] & [address] [place] [address] Phone: [phone] Fax: [phone]     Social Determinants of Health (SDOH) Social History: SDOH Screenings   Food Insecurity: No Food Insecurity (03/26/2023)  Housing: Low Risk  (03/26/2023)  Transportation Needs: No Transportation Needs (03/26/2023)  Utilities: Not At Risk (03/26/2023)  Financial Resource Strain: Low Risk  (02/10/2023)   Received from [facility]  Physical Activity: Unknown (08/10/2021)   Received from [facility]  Recent Concern: Physical Activity - Inactive (08/10/2021)   Received from [facility]  Social Connections: Unknown (01/07/2022)   Received from [facility]  Stress: Stress Concern Present (08/10/2021)   Received from [facility]  Tobacco Use: High Risk (03/26/2023)   SDOH Interventions:     Readmission Risk Interventions    03/09/2023    4:52 PM 02/25/2023    4:01 PM 01/28/2023    1:56 PM  Readmission Risk Prevention Plan  Transportation Screening Complete Complete Complete  Medication Review Oceanographer) Complete Complete Complete  PCP or Specialist appointment within 3-5 days of discharge Complete Complete Complete  HRI or Home Care Consult Complete Complete Complete  SW Recovery Care/Counseling Consult Complete Complete Complete  Palliative Care Screening Not Applicable Not Applicable Not Applicable  Skilled Nursing Facility Not Applicable Not Applicable Not Applicable

## 2023-03-26 NOTE — Plan of Care (Signed)
  Problem: Education: Goal: Knowledge of General Education information will improve Description: Including pain rating scale, medication(s)/side effects and non-pharmacologic comfort measures Outcome: Progressing   Problem: Clinical Measurements: Goal: Ability to maintain clinical measurements within normal limits will improve Outcome: Progressing Goal: Will remain free from infection Outcome: Progressing   Problem: Activity: Goal: Risk for activity intolerance will decrease Outcome: Progressing   Problem: Nutrition: Goal: Adequate nutrition will be maintained Outcome: Progressing   Problem: Coping: Goal: Level of anxiety will decrease Outcome: Progressing   Problem: Elimination: Goal: Will not experience complications related to urinary retention Outcome: Progressing   Problem: Pain Managment: Goal: General experience of comfort will improve Outcome: Progressing

## 2023-03-26 NOTE — ED Notes (Signed)
RT note: Pt. seen in [facility]/ED with 10mg  Albuterol CAT Nebulizer ordered/given, this RT familiar with pt., CXR reviewed along with prior notes/Labs, is easily aroused with no Hypercarbia noted from prior ABG/VBG bloodwork, RT to follow.

## 2023-03-27 ENCOUNTER — Other Ambulatory Visit (HOSPITAL_COMMUNITY): Payer: Self-pay

## 2023-03-27 DIAGNOSIS — J441 Chronic obstructive pulmonary disease with (acute) exacerbation: Secondary | ICD-10-CM | POA: Diagnosis not present

## 2023-03-27 LAB — COMPREHENSIVE METABOLIC PANEL
ALT: 15 U/L (ref 0–44)
AST: 19 U/L (ref 15–41)
Albumin: 3.6 g/dL (ref 3.5–5.0)
Alkaline Phosphatase: 65 U/L (ref 38–126)
Anion gap: 8 (ref 5–15)
BUN: 17 mg/dL (ref 8–23)
CO2: 31 mmol/L (ref 22–32)
Calcium: 8.9 mg/dL (ref 8.9–10.3)
Chloride: 100 mmol/L (ref 98–111)
Creatinine, Ser: 0.56 mg/dL (ref 0.44–1.00)
GFR, Estimated: >60 mL/min (ref >60)
Glucose, Bld: 133 mg/dL — ABNORMAL HIGH (ref 70–99)
Potassium: 4.1 mmol/L (ref 3.5–5.1)
Sodium: 139 mmol/L (ref 135–145)
Total Bilirubin: 0.1 mg/dL — ABNORMAL LOW (ref 0.3–1.2)
Total Protein: 6.9 g/dL (ref 6.5–8.1)

## 2023-03-27 LAB — CBC
HCT: 41.4 % (ref 36.0–46.0)
Hemoglobin: 12.2 g/dL (ref 12.0–15.0)
MCH: 28.1 pg (ref 26.0–34.0)
MCHC: 29.5 g/dL — ABNORMAL LOW (ref 30.0–36.0)
MCV: 95.4 fL (ref 80.0–100.0)
Platelets: 225 10*3/uL (ref 150–400)
RBC: 4.34 MIL/uL (ref 3.87–5.11)
RDW: 13.9 % (ref 11.5–15.5)
WBC: 11.8 10*3/uL — ABNORMAL HIGH (ref 4.0–10.5)
nRBC: 0 % (ref 0.0–0.2)

## 2023-03-27 MED ORDER — DOXYCYCLINE HYCLATE 100 MG PO TABS
100.0000 mg | ORAL_TABLET | Freq: Two times a day (BID) | ORAL | 0 refills | Status: AC
  Filled 2023-03-27: qty 10, 5d supply, fill #0

## 2023-03-27 MED ORDER — PREDNISONE 10 MG PO TABS
ORAL_TABLET | ORAL | 0 refills | Status: DC
  Filled 2023-03-27: qty 60, 12d supply, fill #0

## 2023-03-27 MED ORDER — GUAIFENESIN ER 600 MG PO TB12
600.0000 mg | ORAL_TABLET | Freq: Two times a day (BID) | ORAL | 0 refills | Status: AC
  Filled 2023-03-27: qty 14, 7d supply, fill #0

## 2023-03-27 NOTE — Plan of Care (Signed)
  Problem: Clinical Measurements: Goal: Diagnostic test results will improve Outcome: Progressing Goal: Respiratory complications will improve Outcome: Progressing Goal: Cardiovascular complication will be avoided Outcome: Progressing   

## 2023-03-27 NOTE — Discharge Summary (Signed)
Physician Discharge Summary  Tracy Park [id number] DOB: September 23, 1955 DOA: 03/26/2023  PCP: Jacquelin Hawking, PA-C  Admit date: 03/26/2023 Discharge date: 03/27/2023  Admitted From: Home Discharge disposition: Home  Recommendations at discharge:  Take doxycycline for 5 days.   You been started on prolonged course of prednisone.   Mucinex for cough  Continue using bronchodilators stop smoking Follow-up with your pulmonologist as an outpatient   Brief narrative: Tracy Park is a 67 y.o. female with PMH significant for COPD, lung cancer on home oxygen; HTN, HLD, CAD/MI, paroxysmal A-fib, sleep apnea, anxiety, bipolar disorder, PTSD, hydroureteronephrosis, hypothyroidism  7/17, patient presented to the ED with complaint of dyspnea, wheezing, respiratory distress  In the ED, patient was afebrile, hemodynamically stable, breathing on 2 L oxygen Labs showed unremarkable CBC, unremarkable BMP, troponin normal Respiratory virus panel normal Chest x-ray showed hyperexpansion without acute cardiopulmonary findings. 11 mm spiculated nodule identified in the left lung base on CT chest 3 weeks ago was not seen in this x-ray. Patient was given IV Solu-Medrol, IV Rocephin for COPD exacerbation Kept on observation to [facility]  Subjective: Patient was seen and examined this morning.  Propped up in bed.  Feels better than at presentation.  Able to ambulate in the hallway this morning on 2 L oxygen with saturation over 90%. States he needs to get home today to help her daughter. Chart reviewed Remains afebrile, hemodynamically stable Repeat labs today unremarkable  Hospital course: COPD with acute exacerbation  Chronic hypoxic respiratory failure  With shortness of breath, cough, wheezing Chest x-ray without evidence of pneumonia. Given IV Solu-Medrol and 1 dose of IV Rocephin Currently on Solu-Medrol IV 40 mg ED Given her significant COPD and recurrent hospitalization, I will treat  her with 5 days of doxycycline and a prolonged course of prednisone. Continue bronchodilators Mucinex for 7 days Continue supplemental oxygen 2 L at home as before.  Ambulated on the hallway on 2 L this morning. She follows up with pulmonologist at [facility].  But has not seen for a while now.  Recommend follow-up in 1 to 2 weeks.  H/o lung cancer 2018 S/p left upper lobe excision, chemo -oncologist at [facility].  CAD/MI HLD Continue PTA meds- metoprolol, Eliquis, statin  Paroxysmal atrial fibrillation Continue metoprolol succinate 12.5 mg p.o. daily. Continue apixaban 5 mg p.o. twice daily.  Essential hypertension Continue Toprol, losartan  GERD  Continue pantoprazole 40 mg p.o. daily.  Hypothyroidism Continue levothyroxine 125 mcg p.o. daily.  Mobility: Independent  Goals of care   Code Status: DNR    Wounds:  -    Discharge Exam:   Vitals:   03/26/23 1954 03/26/23 1959 03/27/23 0459 03/27/23 0828  BP:  (!) 109/54 108/70   Pulse:  79 77   Resp:  20 20   Temp:  98 F (36.7 C) 97.8 F (36.6 C)   TempSrc:  Oral Oral   SpO2: 95% 97% 97% 94%  Weight:      Height:        Body mass index is 19.29 kg/m.  General exam: Pleasant, middle-aged Caucasian female.  Looks older for age. Skin: No rashes, lesions or ulcers. HEENT: Atraumatic, normocephalic, no obvious bleeding Lungs: Scattered end expiratory wheezing more on left than right.  No crackles CVS: Regular rate and rhythm, no murmur GI/Abd soft, nontender, nondistended, bowel sound present CNS: Alert, awake, oriented x 3 Psychiatry: Mood appropriate.  Tearful while talking about her personal and family stress Extremities: No pedal edema,  no calf tenderness  Follow ups:    Follow-up Information     Jacquelin Hawking, PA-C Follow up.   Specialty: Physician Assistant Contact information: [address] [place] [address] [phone]                 Discharge Instructions:    Discharge Instructions     Call MD for:  difficulty breathing, headache or visual disturbances   Complete by: As directed    Call MD for:  extreme fatigue   Complete by: As directed    Call MD for:  hives   Complete by: As directed    Call MD for:  persistant dizziness or light-headedness   Complete by: As directed    Call MD for:  persistant nausea and vomiting   Complete by: As directed    Call MD for:  severe uncontrolled pain   Complete by: As directed    Call MD for:  temperature >100.4   Complete by: As directed    Diet general   Complete by: As directed    Discharge instructions   Complete by: As directed    Recommendations at discharge:   Take doxycycline for 5 days.    You been started on prolonged course of prednisone.    Mucinex for cough   Continue using bronchodilators  stop smoking  Follow-up with your pulmonologist as an outpatient  General discharge instructions: Follow with Primary MD Jacquelin Hawking, PA-C in 7 days  Please request your PCP  to go over your hospital tests, procedures, radiology results at the follow up. Please get your medicines reviewed and adjusted.  Your PCP may decide to repeat certain labs or tests as needed. Do not drive, operate heavy machinery, perform activities at heights, swimming or participation in water activities or provide baby sitting services if your were admitted for syncope or siezures until you have seen by Primary MD or a Neurologist and advised to do so again. [place] Controlled Substance Reporting System database was reviewed. Do not drive, operate heavy machinery, perform activities at heights, swim, participate in water activities or provide baby-sitting services while on medications for pain, sleep and mood until your outpatient physician has reevaluated you and advised to do so again.  You are strongly recommended to comply with the dose, frequency and duration of prescribed medications. Activity: As  tolerated with Full fall precautions use walker/cane & assistance as needed Avoid using any recreational substances like cigarette, tobacco, alcohol, or non-prescribed drug. If you experience worsening of your admission symptoms, develop shortness of breath, life threatening emergency, suicidal or homicidal thoughts you must seek medical attention immediately by calling 911 or calling your MD immediately  if symptoms less severe. You must read complete instructions/literature along with all the possible adverse reactions/side effects for all the medicines you take and that have been prescribed to you. Take any new medicine only after you have completely understood and accepted all the possible adverse reactions/side effects.  Wear Seat belts while driving. You were cared for by a hospitalist during your hospital stay. If you have any questions about your discharge medications or the care you received while you were in the hospital after you are discharged, you can call the unit and ask to speak with the hospitalist or the covering physician. Once you are discharged, your primary care physician will handle any further medical issues. Please note that NO REFILLS for any discharge medications will be authorized once you are  discharged, as it is imperative that you return to your primary care physician (or establish a relationship with a primary care physician if you do not have one).   Increase activity slowly   Complete by: As directed        Discharge Medications:   Allergies as of 03/27/2023       Reactions   Red Dye Hives, Itching, Other (See Comments)   Red food dye   Oxycontin [oxycodone Hcl] Other (See Comments)   Hallucinations    Prednisone Hives, Nausea And Vomiting   Aspirin Hives   Strawberry Extract Hives, Itching   Tomato Hives, Itching   Tape Rash, Other (See Comments)   Prefers paper tape   Wound Dressing Adhesive Rash        Medication List     TAKE these medications     acetaminophen 500 MG tablet Commonly known as: TYLENOL Take 500 mg by mouth every 6 (six) hours as needed for moderate pain.   albuterol 108 (90 Base) MCG/ACT inhaler Commonly known as: VENTOLIN HFA Inhale 2 puffs into the lungs every 6 (six) hours as needed for wheezing or shortness of breath. What changed: Another medication with the same name was changed. Make sure you understand how and when to take each.   albuterol (2.5 MG/3ML) 0.083% nebulizer solution Commonly known as: PROVENTIL Take 3 mLs (2.5 mg total) by nebulization every 6 (six) hours as needed for wheezing or shortness of breath. What changed:  when to take this additional instructions   Breztri Aerosphere 160-9-4.8 MCG/ACT Aero Generic drug: Budeson-Glycopyrrol-Formoterol Inhale 1 puff into the lungs in the morning and at bedtime.   doxycycline 100 MG tablet Commonly known as: VIBRA-TABS Take 1 tablet (100 mg total) by mouth 2 (two) times daily for 5 days.   Eliquis 5 MG Tabs tablet Generic drug: apixaban Take 1 tablet (5 mg total) by mouth 2 (two) times daily.   guaiFENesin 600 MG 12 hr tablet Commonly known as: Mucinex Take 1 tablet (600 mg total) by mouth 2 (two) times daily for 7 days.   levothyroxine 125 MCG tablet Commonly known as: SYNTHROID Take 125 mcg by mouth daily before breakfast.   losartan 25 MG tablet Commonly known as: COZAAR Take 0.5 tablets (12.5 mg total) by mouth in the morning.   Magnesium Oxide 400 MG Caps Take 1 capsule (400 mg total) by mouth daily at 12 noon. What changed: when to take this   metoprolol succinate 25 MG 24 hr tablet Commonly known as: TOPROL-XL Take 0.5 tablets (12.5 mg total) by mouth daily.   nitroGLYCERIN 0.4 MG SL tablet Commonly known as: NITROSTAT Place 0.4 mg under the tongue every 5 (five) minutes as needed for chest pain.   OXYGEN Inhale 2 L/min into the lungs continuous.   pantoprazole 40 MG tablet Commonly known as: PROTONIX Take 40 mg by  mouth daily before breakfast.   predniSONE 10 MG tablet Commonly known as: DELTASONE Take 6 tabs twice a day for 2 days, then take 4 tabs twice a day for 2 days, then take 4 tabs daily for 2 days, then take 3 tabs daily for 2 days, then take 2 tabs daily for 2 days, then take 1 tab daily for 2 days, then STOP.   rosuvastatin 20 MG tablet Commonly known as: CRESTOR Take 1 tablet (20 mg total) by mouth daily.   Systane Complete PF 0.6 % Soln Generic drug: Propylene Glycol (PF) Place 1 drop into both eyes 3 (  three) times daily as needed (for dryness).   tamsulosin 0.4 MG Caps capsule Commonly known as: FLOMAX Take 1 capsule (0.4 mg total) by mouth in the morning.         The results of significant diagnostics from this hospitalization (including imaging, microbiology, ancillary and laboratory) are listed below for reference.    Procedures and Diagnostic Studies:   DG Chest Portable 1 View  Result Date: 03/26/2023 CLINICAL DATA:  Shortness of breath. EXAM: PORTABLE CHEST 1 VIEW COMPARISON:  03/06/2023 FINDINGS: Stable volume loss with scarring in the left apex. Lungs are hyperexpanded. The lungs are clear without focal pneumonia, edema, pneumothorax or pleural effusion. The cardiopericardial silhouette is within normal limits for size. No acute bony abnormality. Telemetry leads overlie the chest. IMPRESSION: Hyperexpansion without acute cardiopulmonary findings. 11 mm spiculated nodule identified in the left lung base on CT chest 03/08/2023 is not evident by x-ray today. Electronically Signed   By: Kennith Center M.D.   On: 03/26/2023 06:20     Labs:   Basic Metabolic Panel: Recent Labs  Lab 03/26/23 0536 03/27/23 0540  NA 140 139  K 3.5 4.1  CL 99 100  CO2  --  31  GLUCOSE 131* 133*  BUN 13 17  CREATININE 0.60 0.56  CALCIUM  --  8.9   GFR Estimated Creatinine Clearance: 55.4 mL/min (by C-G formula based on SCr of 0.56 mg/dL). Liver Function Tests: Recent Labs  Lab  03/27/23 0540  AST 19  ALT 15  ALKPHOS 65  BILITOT 0.1*  PROT 6.9  ALBUMIN 3.6   No results for input(s): "LIPASE", "AMYLASE" in the last 168 hours. No results for input(s): "AMMONIA" in the last 168 hours. Coagulation profile No results for input(s): "INR", "PROTIME" in the last 168 hours.  CBC: Recent Labs  Lab 03/26/23 0430 03/26/23 0536 03/27/23 0540  WBC 8.9  --  11.8*  NEUTROABS 5.6  --   --   HGB 12.0 12.6 12.2  HCT 41.2 37.0 41.4  MCV 96.9  --  95.4  PLT 194  --  225   Cardiac Enzymes: No results for input(s): "CKTOTAL", "CKMB", "CKMBINDEX", "TROPONINI" in the last 168 hours. BNP: Invalid input(s): "POCBNP" CBG: No results for input(s): "GLUCAP" in the last 168 hours. D-Dimer No results for input(s): "DDIMER" in the last 72 hours. Hgb A1c No results for input(s): "HGBA1C" in the last 72 hours. Lipid Profile No results for input(s): "CHOL", "HDL", "LDLCALC", "TRIG", "CHOLHDL", "LDLDIRECT" in the last 72 hours. Thyroid function studies No results for input(s): "TSH", "T4TOTAL", "T3FREE", "THYROIDAB" in the last 72 hours.  Invalid input(s): "FREET3" Anemia work up No results for input(s): "VITAMINB12", "FOLATE", "FERRITIN", "TIBC", "IRON", "RETICCTPCT" in the last 72 hours. Microbiology Recent Results (from the past 240 hour(s))  Resp panel by RT-PCR (RSV, Flu A&B, Covid) Anterior Nasal Swab     Status: None   Collection Time: 03/26/23  5:21 AM   Specimen: Anterior Nasal Swab  Result Value Ref Range Status   SARS Coronavirus 2 by RT PCR NEGATIVE NEGATIVE Final    Comment: (NOTE) SARS-CoV-2 target nucleic acids are NOT DETECTED.  The SARS-CoV-2 RNA is generally detectable in upper respiratory specimens during the acute phase of infection. The lowest concentration of SARS-CoV-2 viral copies this assay can detect is 138 copies/mL. A negative result does not preclude SARS-Cov-2 infection and should not be used as the sole basis for treatment or other  patient management decisions. A negative result may occur with  improper specimen collection/handling, submission of specimen other than nasopharyngeal swab, presence of viral mutation(s) within the areas targeted by this assay, and inadequate number of viral copies(<138 copies/mL). A negative result must be combined with clinical observations, patient history, and epidemiological information. The expected result is Negative.  Fact Sheet for Patients:  BloggerCourse.com  Fact Sheet for Healthcare Providers:  SeriousBroker.it  This test is no t yet approved or cleared by the [place] FDA and  has been authorized for detection and/or diagnosis of SARS-CoV-2 by FDA under an Emergency Use Authorization (EUA). This EUA will remain  in effect (meaning this test can be used) for the duration of the COVID-19 declaration under Section 564(b)(1) of the Act, 21 U.S.C.section 360bbb-3(b)(1), unless the authorization is terminated  or revoked sooner.       Influenza A by PCR NEGATIVE NEGATIVE Final   Influenza B by PCR NEGATIVE NEGATIVE Final    Comment: (NOTE) The Xpert Xpress SARS-CoV-2/FLU/RSV plus assay is intended as an aid in the diagnosis of influenza from Nasopharyngeal swab specimens and should not be used as a sole basis for treatment. Nasal washings and aspirates are unacceptable for Xpert Xpress SARS-CoV-2/FLU/RSV testing.  Fact Sheet for Patients: BloggerCourse.com  Fact Sheet for Healthcare Providers: SeriousBroker.it  This test is not yet approved or cleared by the [place] FDA and has been authorized for detection and/or diagnosis of SARS-CoV-2 by FDA under an Emergency Use Authorization (EUA). This EUA will remain in effect (meaning this test can be used) for the duration of the COVID-19 declaration under Section 564(b)(1) of the Act, 21 U.S.C. section  360bbb-3(b)(1), unless the authorization is terminated or revoked.     Resp Syncytial Virus by PCR NEGATIVE NEGATIVE Final    Comment: (NOTE) Fact Sheet for Patients: BloggerCourse.com  Fact Sheet for Healthcare Providers: SeriousBroker.it  This test is not yet approved or cleared by the [place] FDA and has been authorized for detection and/or diagnosis of SARS-CoV-2 by FDA under an Emergency Use Authorization (EUA). This EUA will remain in effect (meaning this test can be used) for the duration of the COVID-19 declaration under Section 564(b)(1) of the Act, 21 U.S.C. section 360bbb-3(b)(1), unless the authorization is terminated or revoked.  Performed at [facility], [address], [place], [place] [address]     Time coordinating discharge: 45 minutes  Signed: Melina Schools [facility]  [facility] 03/27/2023, 11:36 AM

## 2023-03-27 NOTE — TOC Transition Note (Signed)
Transition of Care [facility]) - CM/SW Discharge Note   Patient Details  Name: Tracy Park MRN: [id number] Date of Birth: 12/14/1955  Transition of Care [facility]) CM/SW Contact:  Lanier Clam, RN Phone Number: 03/27/2023, 12:09 PM   Clinical Narrative:  d/c home by PTAR confirmed address. DNR,med necessity,face sheet nsg to place in packet. On 02.PTAR called.No further CM needs.     Final next level of care: Home/Self Care Barriers to Discharge: No Barriers Identified   Patient Goals and CMS Choice CMS Medicare.gov Compare Post Acute Care list provided to:: Patient Choice offered to / list presented to : Patient  Discharge Placement                  Patient to be transferred to facility by: PTAR Name of family member notified: Patient/left vm w/dtr Shanda Bumps Patient and family notified of of transfer: 03/27/23  Discharge Plan and Services Additional resources added to the After Visit Summary for     Discharge Planning Services: CM Consult Post Acute Care Choice: Resumption of Svcs/PTA Provider                               Social Determinants of Health (SDOH) Interventions SDOH Screenings   Food Insecurity: No Food Insecurity (03/26/2023)  Housing: Low Risk  (03/26/2023)  Transportation Needs: No Transportation Needs (03/26/2023)  Utilities: Not At Risk (03/26/2023)  Financial Resource Strain: Low Risk  (02/10/2023)   Received from [facility]  Physical Activity: Unknown (08/10/2021)   Received from [facility]  Recent Concern: Physical Activity - Inactive (08/10/2021)   Received from [facility]  Social Connections: Unknown (01/07/2022)   Received from [facility]  Stress: Stress Concern Present (08/10/2021)   Received from [facility]  Tobacco Use: High Risk (03/26/2023)     Readmission Risk Interventions    03/09/2023    4:52 PM 02/25/2023    4:01 PM 01/28/2023    1:56 PM  Readmission Risk  Prevention Plan  Transportation Screening Complete Complete Complete  Medication Review Oceanographer) Complete Complete Complete  PCP or Specialist appointment within 3-5 days of discharge Complete Complete Complete  HRI or Home Care Consult Complete Complete Complete  SW Recovery Care/Counseling Consult Complete Complete Complete  Palliative Care Screening Not Applicable Not Applicable Not Applicable  Skilled Nursing Facility Not Applicable Not Applicable Not Applicable

## 2023-03-27 NOTE — Plan of Care (Signed)
  Problem: Education: Goal: Knowledge of General Education information will improve Description: Including pain rating scale, medication(s)/side effects and non-pharmacologic comfort measures Outcome: Progressing   Problem: Clinical Measurements: Goal: Ability to maintain clinical measurements within normal limits will improve Outcome: Progressing Goal: Will remain free from infection Outcome: Progressing   Problem: Activity: Goal: Risk for activity intolerance will decrease Outcome: Progressing   Problem: Nutrition: Goal: Adequate nutrition will be maintained Outcome: Progressing   Problem: Coping: Goal: Level of anxiety will decrease Outcome: Progressing   Problem: Elimination: Goal: Will not experience complications related to bowel motility Outcome: Progressing   Problem: Pain Managment: Goal: General experience of comfort will improve Outcome: Progressing

## 2023-04-11 ENCOUNTER — Emergency Department (HOSPITAL_COMMUNITY): Payer: 59

## 2023-04-11 ENCOUNTER — Inpatient Hospital Stay (HOSPITAL_COMMUNITY)
Admission: EM | Admit: 2023-04-11 | Discharge: 2023-04-13 | DRG: 190 | Disposition: A | Discharge status: Home / Self Care | Payer: 59 | Attending: Internal Medicine | Admitting: Internal Medicine

## 2023-04-11 DIAGNOSIS — I480 Paroxysmal atrial fibrillation: Secondary | ICD-10-CM | POA: Diagnosis present

## 2023-04-11 DIAGNOSIS — J9621 Acute and chronic respiratory failure with hypoxia: Secondary | ICD-10-CM | POA: Diagnosis present

## 2023-04-11 DIAGNOSIS — Z7901 Long term (current) use of anticoagulants: Secondary | ICD-10-CM

## 2023-04-11 DIAGNOSIS — Z79899 Other long term (current) drug therapy: Secondary | ICD-10-CM

## 2023-04-11 DIAGNOSIS — Z91048 Other nonmedicinal substance allergy status: Secondary | ICD-10-CM

## 2023-04-11 DIAGNOSIS — D649 Anemia, unspecified: Secondary | ICD-10-CM | POA: Diagnosis present

## 2023-04-11 DIAGNOSIS — Z79890 Hormone replacement therapy (postmenopausal): Secondary | ICD-10-CM

## 2023-04-11 DIAGNOSIS — G4733 Obstructive sleep apnea (adult) (pediatric): Secondary | ICD-10-CM | POA: Diagnosis present

## 2023-04-11 DIAGNOSIS — J9601 Acute respiratory failure with hypoxia: Secondary | ICD-10-CM | POA: Diagnosis not present

## 2023-04-11 DIAGNOSIS — I252 Old myocardial infarction: Secondary | ICD-10-CM

## 2023-04-11 DIAGNOSIS — Z85118 Personal history of other malignant neoplasm of bronchus and lung: Secondary | ICD-10-CM

## 2023-04-11 DIAGNOSIS — Z885 Allergy status to narcotic agent status: Secondary | ICD-10-CM

## 2023-04-11 DIAGNOSIS — C3412 Malignant neoplasm of upper lobe, left bronchus or lung: Secondary | ICD-10-CM | POA: Diagnosis present

## 2023-04-11 DIAGNOSIS — Z902 Acquired absence of lung [part of]: Secondary | ICD-10-CM

## 2023-04-11 DIAGNOSIS — F319 Bipolar disorder, unspecified: Secondary | ICD-10-CM | POA: Diagnosis present

## 2023-04-11 DIAGNOSIS — F17210 Nicotine dependence, cigarettes, uncomplicated: Secondary | ICD-10-CM | POA: Diagnosis present

## 2023-04-11 DIAGNOSIS — K219 Gastro-esophageal reflux disease without esophagitis: Secondary | ICD-10-CM | POA: Diagnosis present

## 2023-04-11 DIAGNOSIS — Z1152 Encounter for screening for COVID-19: Secondary | ICD-10-CM

## 2023-04-11 DIAGNOSIS — Z720 Tobacco use: Secondary | ICD-10-CM | POA: Diagnosis present

## 2023-04-11 DIAGNOSIS — Z91018 Allergy to other foods: Secondary | ICD-10-CM

## 2023-04-11 DIAGNOSIS — Z9981 Dependence on supplemental oxygen: Secondary | ICD-10-CM

## 2023-04-11 DIAGNOSIS — F419 Anxiety disorder, unspecified: Secondary | ICD-10-CM | POA: Diagnosis present

## 2023-04-11 DIAGNOSIS — I2510 Atherosclerotic heart disease of native coronary artery without angina pectoris: Secondary | ICD-10-CM | POA: Diagnosis present

## 2023-04-11 DIAGNOSIS — Z888 Allergy status to other drugs, medicaments and biological substances status: Secondary | ICD-10-CM

## 2023-04-11 DIAGNOSIS — J441 Chronic obstructive pulmonary disease with (acute) exacerbation: Secondary | ICD-10-CM | POA: Diagnosis not present

## 2023-04-11 DIAGNOSIS — I10 Essential (primary) hypertension: Secondary | ICD-10-CM | POA: Diagnosis present

## 2023-04-11 DIAGNOSIS — E785 Hyperlipidemia, unspecified: Secondary | ICD-10-CM | POA: Diagnosis present

## 2023-04-11 DIAGNOSIS — E039 Hypothyroidism, unspecified: Secondary | ICD-10-CM | POA: Diagnosis present

## 2023-04-11 DIAGNOSIS — M79672 Pain in left foot: Secondary | ICD-10-CM | POA: Diagnosis present

## 2023-04-11 DIAGNOSIS — F4310 Post-traumatic stress disorder, unspecified: Secondary | ICD-10-CM | POA: Diagnosis present

## 2023-04-11 DIAGNOSIS — Z91041 Radiographic dye allergy status: Secondary | ICD-10-CM

## 2023-04-11 LAB — RESP PANEL BY RT-PCR (RSV, FLU A&B, COVID)  RVPGX2
Influenza A by PCR: NEGATIVE
Influenza B by PCR: NEGATIVE
Resp Syncytial Virus by PCR: NEGATIVE
SARS Coronavirus 2 by RT PCR: NEGATIVE

## 2023-04-11 LAB — CBC WITH DIFFERENTIAL/PLATELET
Abs Immature Granulocytes: 0.05 10*3/uL (ref 0.00–0.07)
Basophils Absolute: 0 10*3/uL (ref 0.0–0.1)
Basophils Relative: 0 %
Eosinophils Absolute: 0 10*3/uL (ref 0.0–0.5)
Eosinophils Relative: 0 %
HCT: 41.7 % (ref 36.0–46.0)
Hemoglobin: 12.1 g/dL (ref 12.0–15.0)
Immature Granulocytes: 0 %
Lymphocytes Relative: 15 %
Lymphs Abs: 1.7 10*3/uL (ref 0.7–4.0)
MCH: 27.8 pg (ref 26.0–34.0)
MCHC: 29 g/dL — ABNORMAL LOW (ref 30.0–36.0)
MCV: 95.9 fL (ref 80.0–100.0)
Monocytes Absolute: 0.4 10*3/uL (ref 0.1–1.0)
Monocytes Relative: 4 %
Neutro Abs: 9.1 10*3/uL — ABNORMAL HIGH (ref 1.7–7.7)
Neutrophils Relative %: 81 %
Platelets: 212 10*3/uL (ref 150–400)
RBC: 4.35 MIL/uL (ref 3.87–5.11)
RDW: 14.6 % (ref 11.5–15.5)
WBC: 11.4 10*3/uL — ABNORMAL HIGH (ref 4.0–10.5)
nRBC: 0 % (ref 0.0–0.2)

## 2023-04-11 LAB — COMPREHENSIVE METABOLIC PANEL
ALT: 18 U/L (ref 0–44)
AST: 17 U/L (ref 15–41)
Albumin: 3.6 g/dL (ref 3.5–5.0)
Alkaline Phosphatase: 59 U/L (ref 38–126)
Anion gap: 8 (ref 5–15)
BUN: 18 mg/dL (ref 8–23)
CO2: 36 mmol/L — ABNORMAL HIGH (ref 22–32)
Calcium: 8.7 mg/dL — ABNORMAL LOW (ref 8.9–10.3)
Chloride: 97 mmol/L — ABNORMAL LOW (ref 98–111)
Creatinine, Ser: 0.62 mg/dL (ref 0.44–1.00)
GFR, Estimated: >60 mL/min (ref >60)
Glucose, Bld: 101 mg/dL — ABNORMAL HIGH (ref 70–99)
Potassium: 3.5 mmol/L (ref 3.5–5.1)
Sodium: 141 mmol/L (ref 135–145)
Total Bilirubin: 0.4 mg/dL (ref 0.3–1.2)
Total Protein: 6.4 g/dL — ABNORMAL LOW (ref 6.5–8.1)

## 2023-04-11 MED ORDER — APIXABAN 5 MG PO TABS
5.0000 mg | ORAL_TABLET | Freq: Two times a day (BID) | ORAL | Status: DC
  Administered 2023-04-11 – 2023-04-13 (×4): 5 mg via ORAL
  Filled 2023-04-11 (×4): qty 1

## 2023-04-11 MED ORDER — MAGNESIUM OXIDE -MG SUPPLEMENT 400 (240 MG) MG PO TABS
400.0000 mg | ORAL_TABLET | Freq: Every day | ORAL | Status: DC
  Administered 2023-04-12 – 2023-04-13 (×2): 400 mg via ORAL
  Filled 2023-04-11 (×2): qty 1

## 2023-04-11 MED ORDER — MORPHINE SULFATE (PF) 2 MG/ML IV SOLN
1.0000 mg | Freq: Four times a day (QID) | INTRAVENOUS | Status: DC | PRN
  Administered 2023-04-12: 1 mg via INTRAVENOUS
  Filled 2023-04-11: qty 1

## 2023-04-11 MED ORDER — SODIUM CHLORIDE 0.9 % IV SOLN
500.0000 mg | INTRAVENOUS | Status: DC
  Administered 2023-04-11 – 2023-04-12 (×2): 500 mg via INTRAVENOUS
  Filled 2023-04-11 (×3): qty 5

## 2023-04-11 MED ORDER — LEVOTHYROXINE SODIUM 125 MCG PO TABS
125.0000 ug | ORAL_TABLET | Freq: Every day | ORAL | Status: DC
  Administered 2023-04-12 – 2023-04-13 (×2): 125 ug via ORAL
  Filled 2023-04-11 (×2): qty 1

## 2023-04-11 MED ORDER — SODIUM CHLORIDE 0.9 % IV BOLUS
1000.0000 mL | Freq: Once | INTRAVENOUS | Status: AC
  Administered 2023-04-11: 1000 mL via INTRAVENOUS

## 2023-04-11 MED ORDER — NITROGLYCERIN 0.4 MG SL SUBL: 0.4000 mg | SUBLINGUAL_TABLET | SUBLINGUAL | Status: DC | PRN

## 2023-04-11 MED ORDER — TRAZODONE HCL 50 MG PO TABS
25.0000 mg | ORAL_TABLET | Freq: Every evening | ORAL | Status: DC | PRN
  Administered 2023-04-12: 25 mg via ORAL
  Filled 2023-04-11: qty 1

## 2023-04-11 MED ORDER — METOPROLOL SUCCINATE ER 25 MG PO TB24
12.5000 mg | ORAL_TABLET | Freq: Every day | ORAL | Status: DC
  Administered 2023-04-12 – 2023-04-13 (×2): 12.5 mg via ORAL
  Filled 2023-04-11 (×2): qty 1

## 2023-04-11 MED ORDER — SODIUM CHLORIDE 0.9 % IV SOLN: INTRAVENOUS | Status: DC

## 2023-04-11 MED ORDER — ONDANSETRON HCL 4 MG PO TABS: 4.0000 mg | ORAL_TABLET | Freq: Four times a day (QID) | ORAL | Status: DC | PRN

## 2023-04-11 MED ORDER — MOMETASONE FURO-FORMOTEROL FUM 100-5 MCG/ACT IN AERO
2.0000 | INHALATION_SPRAY | Freq: Two times a day (BID) | RESPIRATORY_TRACT | Status: DC
  Filled 2023-04-11 (×2): qty 8.8

## 2023-04-11 MED ORDER — IPRATROPIUM-ALBUTEROL 0.5-2.5 (3) MG/3ML IN SOLN
3.0000 mL | Freq: Once | RESPIRATORY_TRACT | Status: AC
  Administered 2023-04-11: 3 mL via RESPIRATORY_TRACT
  Filled 2023-04-11: qty 3

## 2023-04-11 MED ORDER — ALBUTEROL SULFATE (2.5 MG/3ML) 0.083% IN NEBU
2.5000 mg | INHALATION_SOLUTION | Freq: Four times a day (QID) | RESPIRATORY_TRACT | Status: DC
  Administered 2023-04-12: 2.5 mg via RESPIRATORY_TRACT
  Filled 2023-04-11: qty 3

## 2023-04-11 MED ORDER — HYDRALAZINE HCL 20 MG/ML IJ SOLN: 10.0000 mg | Freq: Three times a day (TID) | INTRAMUSCULAR | Status: DC | PRN

## 2023-04-11 MED ORDER — MAGNESIUM SULFATE 2 GM/50ML IV SOLN
2.0000 g | Freq: Once | INTRAVENOUS | Status: AC
  Administered 2023-04-11: 2 g via INTRAVENOUS
  Filled 2023-04-11: qty 50

## 2023-04-11 MED ORDER — UMECLIDINIUM BROMIDE 62.5 MCG/ACT IN AEPB
1.0000 | INHALATION_SPRAY | Freq: Every day | RESPIRATORY_TRACT | Status: DC
  Filled 2023-04-11: qty 7

## 2023-04-11 MED ORDER — BUDESON-GLYCOPYRROL-FORMOTEROL 160-9-4.8 MCG/ACT IN AERO: 1.0000 | INHALATION_SPRAY | Freq: Two times a day (BID) | RESPIRATORY_TRACT | Status: DC

## 2023-04-11 MED ORDER — ROSUVASTATIN CALCIUM 20 MG PO TABS
20.0000 mg | ORAL_TABLET | Freq: Every day | ORAL | Status: DC
  Administered 2023-04-12 – 2023-04-13 (×2): 20 mg via ORAL
  Filled 2023-04-11 (×2): qty 1

## 2023-04-11 MED ORDER — ONDANSETRON HCL 4 MG/2ML IJ SOLN: 4.0000 mg | Freq: Four times a day (QID) | INTRAMUSCULAR | Status: DC | PRN

## 2023-04-11 MED ORDER — ACETAMINOPHEN 325 MG PO TABS: 650.0000 mg | ORAL_TABLET | Freq: Four times a day (QID) | ORAL | Status: DC | PRN

## 2023-04-11 MED ORDER — LOSARTAN POTASSIUM 25 MG PO TABS
12.5000 mg | ORAL_TABLET | Freq: Every morning | ORAL | Status: DC
  Administered 2023-04-12 – 2023-04-13 (×2): 12.5 mg via ORAL
  Filled 2023-04-11 (×2): qty 1

## 2023-04-11 MED ORDER — BISACODYL 5 MG PO TBEC: 5.0000 mg | DELAYED_RELEASE_TABLET | Freq: Every day | ORAL | Status: DC | PRN

## 2023-04-11 MED ORDER — TRAMADOL HCL 50 MG PO TABS
50.0000 mg | ORAL_TABLET | Freq: Four times a day (QID) | ORAL | Status: DC | PRN
  Administered 2023-04-13: 50 mg via ORAL
  Filled 2023-04-11: qty 1

## 2023-04-11 MED ORDER — PANTOPRAZOLE SODIUM 40 MG PO TBEC
40.0000 mg | DELAYED_RELEASE_TABLET | Freq: Every day | ORAL | Status: DC
  Administered 2023-04-12 – 2023-04-13 (×2): 40 mg via ORAL
  Filled 2023-04-11 (×2): qty 1

## 2023-04-11 MED ORDER — METHYLPREDNISOLONE SODIUM SUCC 125 MG IJ SOLR
60.0000 mg | Freq: Four times a day (QID) | INTRAMUSCULAR | Status: DC
  Administered 2023-04-11 – 2023-04-13 (×7): 60 mg via INTRAVENOUS
  Filled 2023-04-11 (×8): qty 2

## 2023-04-11 MED ORDER — ALBUTEROL SULFATE (2.5 MG/3ML) 0.083% IN NEBU
2.5000 mg | INHALATION_SOLUTION | Freq: Once | RESPIRATORY_TRACT | Status: AC
  Administered 2023-04-11: 2.5 mg via RESPIRATORY_TRACT
  Filled 2023-04-11: qty 3

## 2023-04-11 MED ORDER — IPRATROPIUM BROMIDE 0.02 % IN SOLN: 0.5000 mg | Freq: Four times a day (QID) | RESPIRATORY_TRACT | Status: DC | PRN

## 2023-04-11 MED ORDER — ACETAMINOPHEN 650 MG RE SUPP: 650.0000 mg | Freq: Four times a day (QID) | RECTAL | Status: DC | PRN

## 2023-04-11 MED ORDER — SODIUM CHLORIDE 0.9% FLUSH
3.0000 mL | Freq: Two times a day (BID) | INTRAVENOUS | Status: DC
  Administered 2023-04-11 – 2023-04-13 (×4): 3 mL via INTRAVENOUS

## 2023-04-11 MED ORDER — METHYLPREDNISOLONE SODIUM SUCC 125 MG IJ SOLR
125.0000 mg | Freq: Once | INTRAMUSCULAR | Status: AC
  Administered 2023-04-11: 125 mg via INTRAVENOUS
  Filled 2023-04-11: qty 2

## 2023-04-11 NOTE — H&P (Signed)
History and Physical   [facility] [place] @ [facility] Admission History and Physical [employer], D.O.    Patient Name: Tracy Park MR#: [id number] Date of Birth: Feb 08, 1956 Date of Admission: 04/11/2023  Referring MD/NP/PA: Dr. Estell Harpin Primary Care Physician: Jacquelin Hawking, PA-C  Chief Complaint: No chief complaint on file.   HPI: Tracy Park is a 67 y.o. female with a known history of CAD, history neuronal pain, history of my, bipolar disorder, anxiety, PTSD, COPD on home oxygen at 2 L, lung cancer, sleep apnea, nephrolithiasis, hydroureteronephrosis, hypothyroidism, lung cancer who presented to the emergency department with complaints of progressively worsening shortness of breath and wheezing.  She states after treatment in the emergency department she is essentially unchanged  Of note she was recently hospitalized for COPD exacerbation  EMS/ED Course: Patient received Solumedrol, mag sulfate, NS, Duoneb. Medical admission has been requested for further management of Acute hypoxic resp failure 2/2 COPD exacerbation.  Review of Systems:  CONSTITUTIONAL: Positive generalized fatigue no fever/chills, weakness, weight gain/loss, headache. EYES: No blurry or double vision. ENT: No tinnitus, postnasal drip, redness or soreness of the oropharynx. RESPIRATORY: As of dyspnea on exertion, cough, wheeze.  CARDIOVASCULAR: No chest pain, palpitations, syncope, orthopnea. No lower extremity edema.  GASTROINTESTINAL: No nausea, vomiting, abdominal pain, diarrhea, constipation.  No hematemesis, melena or hematochezia. GENITOURINARY: No dysuria, frequency, hematuria. ENDOCRINE: No polyuria or nocturia. No heat or cold intolerance. HEMATOLOGY: No anemia, bruising, bleeding. INTEGUMENTARY: No rashes, ulcers, lesions. MUSCULOSKELETAL: No arthritis, gout. NEUROLOGIC: No numbness, tingling, ataxia, seizure-type activity, weakness. PSYCHIATRIC: No anxiety, depression,  insomnia.   Past Medical History:  Diagnosis Date   Anginal pain (HCC)    Anxiety    Bipolar disorder (HCC)    Cancer (HCC)    COPD (chronic obstructive pulmonary disease) (HCC)    Dyspnea    Family history of adverse reaction to anesthesia    History of kidney stones    Hydroureteronephrosis 08/16/2021   Hypothyroidism    Lung cancer (HCC)    Myocardial infarction (HCC)    Paroxysmal atrial fibrillation (HCC)    PTSD (post-traumatic stress disorder)    Sleep apnea    Thyroid disease     Past Surgical History:  Procedure Laterality Date   ABDOMINAL HYSTERECTOMY     BACK SURGERY     CYSTOSCOPY W/ URETERAL STENT PLACEMENT Right 05/03/2021   Procedure: CYSTOSCOPY WITH RETROGRADE PYELOGRAM/URETERAL STENT PLACEMENT;  Surgeon: Crist Fat, MD;  Location: [facility];  Service: Urology;  Laterality: Right;   EYE SURGERY     kidney stent     thyroidectomy       reports that she has been smoking cigarettes. She has never used smokeless tobacco. She reports that she does not currently use alcohol. She reports that she does not currently use drugs.  Allergies  Allergen Reactions   Red Dye Hives, Itching and Other (See Comments)    Red food dye   Oxycontin [Oxycodone Hcl] Other (See Comments)    Hallucinations    Prednisone Hives and Nausea And Vomiting   Aspirin Hives   Strawberry Extract Hives and Itching   Tomato Hives and Itching   Tape Rash and Other (See Comments)    Prefers paper tape   Wound Dressing Adhesive Rash    Family History  Family history unknown: Yes    Prior to Admission medications   Medication Sig Start Date End Date Taking? Authorizing Provider  acetaminophen (TYLENOL) 500 MG tablet Take 500 mg  by mouth every 6 (six) hours as needed for moderate pain.    [provider]  albuterol (PROVENTIL) (2.5 MG/3ML) 0.083% nebulizer solution Take 3 mLs (2.5 mg total) by nebulization every 6 (six) hours as needed for wheezing or shortness of  breath. Patient taking differently: Take 2.5 mg by nebulization See admin instructions. Nebulize 2.5 mg and inhale into the lungs in the morning and at bedtime 01/30/23   Tilden Fossa, MD  albuterol (VENTOLIN HFA) 108 (90 Base) MCG/ACT inhaler Inhale 2 puffs into the lungs every 6 (six) hours as needed for wheezing or shortness of breath. 01/13/23   Burnadette Pop, MD  apixaban (ELIQUIS) 5 MG TABS tablet Take 1 tablet (5 mg total) by mouth 2 (two) times daily. 03/10/23 04/09/23  Zigmund Daniel., MD  Budeson-Glycopyrrol-Formoterol (BREZTRI AEROSPHERE) 160-9-4.8 MCG/ACT AERO Inhale 1 puff into the lungs in the morning and at bedtime.    [provider]  levothyroxine (SYNTHROID) 125 MCG tablet Take 125 mcg by mouth daily before breakfast.    [provider]  losartan (COZAAR) 25 MG tablet Take 0.5 tablets (12.5 mg total) by mouth in the morning. 03/10/23 04/09/23  Zigmund Daniel., MD  Magnesium Oxide 400 MG CAPS Take 1 capsule (400 mg total) by mouth daily at 12 noon. Patient taking differently: Take 400 mg by mouth in the morning. 03/01/22   Albertine Grates, MD  metoprolol succinate (TOPROL-XL) 25 MG 24 hr tablet Take 0.5 tablets (12.5 mg total) by mouth daily. 03/10/23 04/09/23  Zigmund Daniel., MD  nitroGLYCERIN (NITROSTAT) 0.4 MG SL tablet Place 0.4 mg under the tongue every 5 (five) minutes as needed for chest pain.    [provider]  OXYGEN Inhale 2 L/min into the lungs continuous.    [provider]  pantoprazole (PROTONIX) 40 MG tablet Take 40 mg by mouth daily before breakfast.    [provider]  predniSONE (DELTASONE) 10 MG tablet Take 6 tablets by mouth twice daily for 2 days THEN take 4 tablets twice daily for 2 days THEN take 4 tablets daily for 2 days THEN take 3 tablets daily for 2 days THEN take 2 tablets daily for 2 days THEN take 1 tablet daily for 2 days 03/27/23   Lorin Glass, MD  rosuvastatin (CRESTOR) 20 MG tablet Take 1 tablet  (20 mg total) by mouth daily. 03/10/23 04/09/23  Zigmund Daniel., MD  SYSTANE COMPLETE PF 0.6 % SOLN Place 1 drop into both eyes 3 (three) times daily as needed (for dryness).    [provider]    Physical Exam: Vitals:   04/11/23 1743 04/11/23 1806 04/11/23 1930  BP: 98/72  96/64  Pulse: 92  81  Resp: (!) 25  (!) 23  Temp: 98.2 F (36.8 C)    SpO2: 94% 91% 95%  Weight: 49.4 kg    Height: 5\' 3"  (1.6 m)      GENERAL: 67 y.o.-year-old white female patient, well-developed, lying in the bed in no acute distress.  Sleepy but arousable.   HEENT: Head atraumatic, normocephalic. Pupils equal. Mucus membranes moist. NECK: Supple. No JVD. CHEST: Pursed lip breathing with diffuse expiratory wheezes.  No use of accessory muscles.  Speaking in full sentences CARDIOVASCULAR: S1, S2 normal. No murmurs, rubs, or gallops. Cap refill <2 seconds. Pulses intact distally.  ABDOMEN: Soft, nondistended, nontender. No rebound, guarding, rigidity. Normoactive bowel sounds present in all four quadrants.  EXTREMITIES: No pedal edema, cyanosis, or clubbing.  No calf tenderness or Homan's sign.  NEUROLOGIC: The patient is alert and oriented x 3. Cranial nerves II through XII are grossly intact with no focal sensorimotor deficit. PSYCHIATRIC:  Normal affect, mood, thought content. SKIN: Warm, dry, and intact without obvious rash, lesion, or ulcer.    Labs on Admission:  CBC: Recent Labs  Lab 04/11/23 1813  WBC 11.4*  NEUTROABS 9.1*  HGB 12.1  HCT 41.7  MCV 95.9  PLT 212   Basic Metabolic Panel: Recent Labs  Lab 04/11/23 1813  NA 141  K 3.5  CL 97*  CO2 36*  GLUCOSE 101*  BUN 18  CREATININE 0.62  CALCIUM 8.7*   GFR: Estimated Creatinine Clearance: 54.7 mL/min (by C-G formula based on SCr of 0.62 mg/dL). Liver Function Tests: Recent Labs  Lab 04/11/23 1813  AST 17  ALT 18  ALKPHOS 59  BILITOT 0.4  PROT 6.4*  ALBUMIN 3.6   No results for input(s): "LIPASE", "AMYLASE"  in the last 168 hours. No results for input(s): "AMMONIA" in the last 168 hours. Coagulation Profile: No results for input(s): "INR", "PROTIME" in the last 168 hours. Cardiac Enzymes: No results for input(s): "CKTOTAL", "CKMB", "CKMBINDEX", "TROPONINI" in the last 168 hours. BNP (last 3 results) No results for input(s): "PROBNP" in the last 8760 hours. HbA1C: No results for input(s): "HGBA1C" in the last 72 hours. CBG: No results for input(s): "GLUCAP" in the last 168 hours. Lipid Profile: No results for input(s): "CHOL", "HDL", "LDLCALC", "TRIG", "CHOLHDL", "LDLDIRECT" in the last 72 hours. Thyroid Function Tests: No results for input(s): "TSH", "T4TOTAL", "FREET4", "T3FREE", "THYROIDAB" in the last 72 hours. Anemia Panel: No results for input(s): "VITAMINB12", "FOLATE", "FERRITIN", "TIBC", "IRON", "RETICCTPCT" in the last 72 hours. Urine analysis:    Component Value Date/Time   COLORURINE STRAW (A) 03/11/2022 1743   APPEARANCEUR CLEAR 03/11/2022 1743   LABSPEC 1.006 03/11/2022 1743   PHURINE 8.0 03/11/2022 1743   GLUCOSEU NEGATIVE 03/11/2022 1743   HGBUR NEGATIVE 03/11/2022 1743   BILIRUBINUR NEGATIVE 03/11/2022 1743   KETONESUR NEGATIVE 03/11/2022 1743   PROTEINUR NEGATIVE 03/11/2022 1743   UROBILINOGEN 0.2 11/06/2009 1559   NITRITE NEGATIVE 03/11/2022 1743   LEUKOCYTESUR NEGATIVE 03/11/2022 1743   Sepsis Labs: @LABRCNTIP (procalcitonin:4,lacticidven:4) )No results found for this or any previous visit (from the past 240 hour(s)).   Radiological Exams on Admission: DG Chest Port 1 View  Result Date: 04/11/2023 CLINICAL DATA:  Shortness of breath. Productive cough. History of known lung cancer. Prior left upper lobectomy. EXAM: PORTABLE CHEST 1 VIEW COMPARISON:  X-ray 03/26/2023 FINDINGS: Once again surgical changes from left-sided upper lobectomy with volume loss, retraction of the hilum and apical pleural thickening. Chronic lung changes elsewhere. No pneumothorax, edema.  Normal cardiopericardial silhouette. Overlapping cardiac leads. IMPRESSION: Stable surgical changes from left lobectomy with volume loss, scarring and pleural thickening. Chronic underlying lung changes elsewhere.  No new consolidation. Electronically Signed   By: Karen Kays M.D.   On: 04/11/2023 17:41    Assessment/Plan  This is a 67 y.o. female with medical history significant of CAD, neuropathic pain, bipolar disorder, anxiety, PTSD, COPD on home oxygen at 2 L, lung cancer, sleep apnea, nephrolithiasis, hydroureteronephrosis, hypothyroidism, lung cancer now being admitted with    #.  Acute on chronic hypoxic respiratory failure secondary to COPD exacerbation - IV steroids and azithromycin - Nebulizers, O2 therapy and expectorants as needed.  - Continuous pulse oximetry - Consider pulmonary consult if not improving.       Paroxysmal atrial  fibrillation (HCC) Continue apixaban 5 mg p.o. twice daily. Continue metoprolol succinate 12.5 mg p.o. daily.     GERD (gastroesophageal reflux disease) Continue pantoprazole 40 mg p.o. daily.     Dyslipidemia Continue rosuvastatin 20 mg p.o. daily.     Hypothyroidism Continue levothyroxine 125 mcg p.o. daily.     Coronary artery disease involving native  coronary artery of native heart without angina pectoris On apixaban, metoprolol and rosuvastatin.     Essential hypertension Continue losartan 25 mg p.o. daily. Continue metoprolol succinate 12.5 mg p.o. daily.  Admission status: Observation IV Fluids: Hep-Lock Diet/Nutrition: Heart healthy Consults called: None DVT Px: Eliquis SCDs and early ambulation. Code Status: Full Code  Disposition Plan: To home in 1-2 days  All the records are reviewed and case discussed with ED provider. Management plans discussed with the patient and/or family who express understanding and agree with plan of care.  [employer] D.O. on 04/11/2023 at 7:44 PM CC: Primary care physician; Jacquelin Hawking, PA-C   04/11/2023, 7:44 PM

## 2023-04-11 NOTE — ED Provider Notes (Signed)
[place] EMERGENCY DEPARTMENT AT [facility] Provider Note   CSN: [id number] Arrival date & time: 04/11/23  1656     History  No chief complaint on file.   Tracy Park is a 67 y.o. female.  Patient complains of shortness of breath.  Patient has a history of COPD and lung cancer with partial resection of lung.  The history is provided by the patient and medical records. No language interpreter was used.  Shortness of Breath Severity:  Moderate Onset quality:  Sudden Timing:  Constant Progression:  Waxing and waning Chronicity:  New Context: not activity   Relieved by:  Nothing Worsened by:  Nothing Ineffective treatments:  None tried Associated symptoms: wheezing   Associated symptoms: no abdominal pain, no chest pain, no cough, no headaches and no rash        Home Medications Prior to Admission medications   Medication Sig Start Date End Date Taking? Authorizing Provider  acetaminophen (TYLENOL) 500 MG tablet Take 500 mg by mouth every 6 (six) hours as needed for moderate pain.    [provider]  albuterol (PROVENTIL) (2.5 MG/3ML) 0.083% nebulizer solution Take 3 mLs (2.5 mg total) by nebulization every 6 (six) hours as needed for wheezing or shortness of breath. Patient taking differently: Take 2.5 mg by nebulization See admin instructions. Nebulize 2.5 mg and inhale into the lungs in the morning and at bedtime 01/30/23   Tilden Fossa, MD  albuterol (VENTOLIN HFA) 108 (90 Base) MCG/ACT inhaler Inhale 2 puffs into the lungs every 6 (six) hours as needed for wheezing or shortness of breath. 01/13/23   Burnadette Pop, MD  apixaban (ELIQUIS) 5 MG TABS tablet Take 1 tablet (5 mg total) by mouth 2 (two) times daily. 03/10/23 04/09/23  Zigmund Daniel., MD  Budeson-Glycopyrrol-Formoterol (BREZTRI AEROSPHERE) 160-9-4.8 MCG/ACT AERO Inhale 1 puff into the lungs in the morning and at bedtime.    [provider]  levothyroxine (SYNTHROID) 125  MCG tablet Take 125 mcg by mouth daily before breakfast.    [provider]  losartan (COZAAR) 25 MG tablet Take 0.5 tablets (12.5 mg total) by mouth in the morning. 03/10/23 04/09/23  Zigmund Daniel., MD  Magnesium Oxide 400 MG CAPS Take 1 capsule (400 mg total) by mouth daily at 12 noon. Patient taking differently: Take 400 mg by mouth in the morning. 03/01/22   Albertine Grates, MD  metoprolol succinate (TOPROL-XL) 25 MG 24 hr tablet Take 0.5 tablets (12.5 mg total) by mouth daily. 03/10/23 04/09/23  Zigmund Daniel., MD  nitroGLYCERIN (NITROSTAT) 0.4 MG SL tablet Place 0.4 mg under the tongue every 5 (five) minutes as needed for chest pain.    [provider]  OXYGEN Inhale 2 L/min into the lungs continuous.    [provider]  pantoprazole (PROTONIX) 40 MG tablet Take 40 mg by mouth daily before breakfast.    [provider]  predniSONE (DELTASONE) 10 MG tablet Take 6 tablets by mouth twice daily for 2 days THEN take 4 tablets twice daily for 2 days THEN take 4 tablets daily for 2 days THEN take 3 tablets daily for 2 days THEN take 2 tablets daily for 2 days THEN take 1 tablet daily for 2 days 03/27/23   Lorin Glass, MD  rosuvastatin (CRESTOR) 20 MG tablet Take 1 tablet (20 mg total) by mouth daily. 03/10/23 04/09/23  Zigmund Daniel., MD  SYSTANE COMPLETE PF 0.6 % SOLN Place 1 drop  into both eyes 3 (three) times daily as needed (for dryness).    [provider]      Allergies    Red dye, Oxycontin [oxycodone hcl], Prednisone, Aspirin, Strawberry extract, Tomato, Tape, and Wound dressing adhesive    Review of Systems   Review of Systems  Constitutional:  Negative for appetite change and fatigue.  HENT:  Negative for congestion, ear discharge and sinus pressure.   Eyes:  Negative for discharge.  Respiratory:  Positive for shortness of breath and wheezing. Negative for cough.   Cardiovascular:  Negative for chest pain.  Gastrointestinal:   Negative for abdominal pain and diarrhea.  Genitourinary:  Negative for frequency and hematuria.  Musculoskeletal:  Negative for back pain.  Skin:  Negative for rash.  Neurological:  Negative for seizures and headaches.  Psychiatric/Behavioral:  Negative for hallucinations.     Physical Exam Updated Vital Signs BP 96/64   Pulse 81   Temp 98.2 F (36.8 C)   Resp (!) 23   Ht 5\' 3"  (1.6 m)   Wt 49.4 kg   SpO2 95%   BMI 19.29 kg/m  Physical Exam Vitals and nursing note reviewed.  Constitutional:      Appearance: She is well-developed.  HENT:     Head: Normocephalic.     Nose: Nose normal.  Eyes:     General: No scleral icterus.    Conjunctiva/sclera: Conjunctivae normal.  Neck:     Thyroid: No thyromegaly.  Cardiovascular:     Rate and Rhythm: Normal rate and regular rhythm.     Heart sounds: No murmur heard.    No friction rub. No gallop.  Pulmonary:     Breath sounds: No stridor. Wheezing present. No rales.  Chest:     Chest wall: No tenderness.  Abdominal:     General: There is no distension.     Tenderness: There is no abdominal tenderness. There is no rebound.  Musculoskeletal:        General: Normal range of motion.     Cervical back: Neck supple.  Lymphadenopathy:     Cervical: No cervical adenopathy.  Skin:    Findings: No erythema or rash.  Neurological:     Mental Status: She is alert and oriented to person, place, and time.     Motor: No abnormal muscle tone.     Coordination: Coordination normal.  Psychiatric:        Behavior: Behavior normal.     ED Results / Procedures / Treatments   Labs (all labs ordered are listed, but only abnormal results are displayed) Labs Reviewed  CBC WITH DIFFERENTIAL/PLATELET - Abnormal; Notable for the following components:      Result Value   WBC 11.4 (*)    MCHC 29.0 (*)    Neutro Abs 9.1 (*)    All other components within normal limits  COMPREHENSIVE METABOLIC PANEL - Abnormal; Notable for the following  components:   Chloride 97 (*)    CO2 36 (*)    Glucose, Bld 101 (*)    Calcium 8.7 (*)    Total Protein 6.4 (*)    All other components within normal limits    EKG None  Radiology DG Chest Port 1 View  Result Date: 04/11/2023 CLINICAL DATA:  Shortness of breath. Productive cough. History of known lung cancer. Prior left upper lobectomy. EXAM: PORTABLE CHEST 1 VIEW COMPARISON:  X-ray 03/26/2023 FINDINGS: Once again surgical changes from left-sided upper lobectomy with volume loss, retraction of the  hilum and apical pleural thickening. Chronic lung changes elsewhere. No pneumothorax, edema. Normal cardiopericardial silhouette. Overlapping cardiac leads. IMPRESSION: Stable surgical changes from left lobectomy with volume loss, scarring and pleural thickening. Chronic underlying lung changes elsewhere.  No new consolidation. Electronically Signed   By: Karen Kays M.D.   On: 04/11/2023 17:41    Procedures Procedures    Medications Ordered in ED Medications  sodium chloride 0.9 % bolus 1,000 mL (has no administration in time range)  ipratropium-albuterol (DUONEB) 0.5-2.5 (3) MG/3ML nebulizer solution 3 mL (3 mLs Nebulization Given 04/11/23 1806)  albuterol (PROVENTIL) (2.5 MG/3ML) 0.083% nebulizer solution 2.5 mg (2.5 mg Nebulization Given 04/11/23 1806)  magnesium sulfate IVPB 2 g 50 mL (0 g Intravenous Stopped 04/11/23 1928)  methylPREDNISolone sodium succinate (SOLU-MEDROL) 125 mg/2 mL injection 125 mg (125 mg Intravenous Given 04/11/23 1812)    ED Course/ Medical Decision Making/ A&P  .edcrf CRITICAL CARE Performed by: Bethann Berkshire Total critical care time: 40 minutes Critical care time was exclusive of separately billable procedures and treating other patients. Critical care was necessary to treat or prevent imminent or life-threatening deterioration. Critical care was time spent personally by me on the following activities: development of treatment plan with patient and/or surrogate as  well as nursing, discussions with consultants, evaluation of patient's response to treatment, examination of patient, obtaining history from patient or surrogate, ordering and performing treatments and interventions, ordering and review of laboratory studies, ordering and review of radiographic studies, pulse oximetry and re-evaluation of patient's condition.                                Medical Decision Making Amount and/or Complexity of Data Reviewed Labs: ordered. Radiology: ordered.  Risk Prescription drug management. Decision regarding hospitalization.  This patient presents to the ED for concern of shortness of breath, this involves an extensive number of treatment options, and is a complaint that carries with it a high risk of complications and morbidity.  The differential diagnosis includes pneumonia, COPD exacerbation   Co morbidities that complicate the patient evaluation  COPD   Additional history obtained:  Additional history obtained from patient External records from outside source obtained and reviewed including hospital records   Lab Tests:  I Ordered, and personally interpreted labs.  The pertinent results include: White count 5.8   Imaging Studies ordered:  I ordered imaging studies including chest x-ray I independently visualized and interpreted imaging which showed no pneumonia I agree with the radiologist interpretation   Cardiac Monitoring: / EKG:  The patient was maintained on a cardiac monitor.  I personally viewed and interpreted the cardiac monitored which showed an underlying rhythm of: Normal sinus rhythm   Consultations Obtained:  I requested consultation with the hospitalist,  and discussed lab and imaging findings as well as pertinent plan - they recommend: Admit   Problem List / ED Course / Critical interventions / Medication management  COPD and shortness of breath I ordered medication including albuterol Atrovent and  steroids Reevaluation of the patient after these medicines showed that the patient improved I have reviewed the patients home medicines and have made adjustments as needed   Social Determinants of Health:  None   Test / Admission - Considered:  None  Patient will be admitted to the hospitalist for COPD exacerbation        Final Clinical Impression(s) / ED Diagnoses Final diagnoses:  COPD exacerbation (HCC)  Rx / DC Orders ED Discharge Orders     None         Bethann Berkshire, MD 04/12/23 1243

## 2023-04-11 NOTE — ED Triage Notes (Signed)
Pt BIBA from urgent care with c/o SOB. Given at UC 0.5 atrovent, 2.5 albuterol, 125mg  Solumedrol IM. EMS gave duoneb (0.5 atrovent and 5mg  albuterol). Hx COPD. Productive cough since yesterday.   108/70 HR 92 100% duoneb, normally on 2L at baseline.

## 2023-04-12 ENCOUNTER — Other Ambulatory Visit: Payer: Self-pay

## 2023-04-12 ENCOUNTER — Encounter (HOSPITAL_COMMUNITY): Payer: Self-pay | Admitting: Family Medicine

## 2023-04-12 DIAGNOSIS — J9621 Acute and chronic respiratory failure with hypoxia: Secondary | ICD-10-CM | POA: Diagnosis present

## 2023-04-12 DIAGNOSIS — M79672 Pain in left foot: Secondary | ICD-10-CM | POA: Diagnosis present

## 2023-04-12 DIAGNOSIS — Z7901 Long term (current) use of anticoagulants: Secondary | ICD-10-CM | POA: Diagnosis not present

## 2023-04-12 DIAGNOSIS — F419 Anxiety disorder, unspecified: Secondary | ICD-10-CM | POA: Diagnosis present

## 2023-04-12 DIAGNOSIS — Z85118 Personal history of other malignant neoplasm of bronchus and lung: Secondary | ICD-10-CM

## 2023-04-12 DIAGNOSIS — I2510 Atherosclerotic heart disease of native coronary artery without angina pectoris: Secondary | ICD-10-CM

## 2023-04-12 DIAGNOSIS — I252 Old myocardial infarction: Secondary | ICD-10-CM | POA: Diagnosis not present

## 2023-04-12 DIAGNOSIS — E039 Hypothyroidism, unspecified: Secondary | ICD-10-CM

## 2023-04-12 DIAGNOSIS — E785 Hyperlipidemia, unspecified: Secondary | ICD-10-CM | POA: Diagnosis present

## 2023-04-12 DIAGNOSIS — I480 Paroxysmal atrial fibrillation: Secondary | ICD-10-CM | POA: Diagnosis present

## 2023-04-12 DIAGNOSIS — I10 Essential (primary) hypertension: Secondary | ICD-10-CM | POA: Diagnosis present

## 2023-04-12 DIAGNOSIS — D649 Anemia, unspecified: Secondary | ICD-10-CM | POA: Diagnosis present

## 2023-04-12 DIAGNOSIS — Z9981 Dependence on supplemental oxygen: Secondary | ICD-10-CM | POA: Diagnosis not present

## 2023-04-12 DIAGNOSIS — F4310 Post-traumatic stress disorder, unspecified: Secondary | ICD-10-CM | POA: Diagnosis present

## 2023-04-12 DIAGNOSIS — Z91041 Radiographic dye allergy status: Secondary | ICD-10-CM | POA: Diagnosis not present

## 2023-04-12 DIAGNOSIS — J441 Chronic obstructive pulmonary disease with (acute) exacerbation: Secondary | ICD-10-CM | POA: Diagnosis present

## 2023-04-12 DIAGNOSIS — K219 Gastro-esophageal reflux disease without esophagitis: Secondary | ICD-10-CM | POA: Diagnosis present

## 2023-04-12 DIAGNOSIS — F17210 Nicotine dependence, cigarettes, uncomplicated: Secondary | ICD-10-CM | POA: Diagnosis present

## 2023-04-12 DIAGNOSIS — Z79899 Other long term (current) drug therapy: Secondary | ICD-10-CM | POA: Diagnosis not present

## 2023-04-12 DIAGNOSIS — Z79890 Hormone replacement therapy (postmenopausal): Secondary | ICD-10-CM | POA: Diagnosis not present

## 2023-04-12 DIAGNOSIS — G4733 Obstructive sleep apnea (adult) (pediatric): Secondary | ICD-10-CM | POA: Diagnosis present

## 2023-04-12 DIAGNOSIS — Z902 Acquired absence of lung [part of]: Secondary | ICD-10-CM | POA: Diagnosis not present

## 2023-04-12 DIAGNOSIS — F319 Bipolar disorder, unspecified: Secondary | ICD-10-CM | POA: Diagnosis present

## 2023-04-12 DIAGNOSIS — Z1152 Encounter for screening for COVID-19: Secondary | ICD-10-CM | POA: Diagnosis not present

## 2023-04-12 MED ORDER — IPRATROPIUM-ALBUTEROL 0.5-2.5 (3) MG/3ML IN SOLN
3.0000 mL | Freq: Four times a day (QID) | RESPIRATORY_TRACT | Status: DC
  Administered 2023-04-12 – 2023-04-13 (×7): 3 mL via RESPIRATORY_TRACT
  Filled 2023-04-12 (×7): qty 3

## 2023-04-12 MED ORDER — ALPRAZOLAM 0.5 MG PO TABS
0.5000 mg | ORAL_TABLET | Freq: Two times a day (BID) | ORAL | Status: DC | PRN
  Administered 2023-04-12: 0.5 mg via ORAL
  Filled 2023-04-12: qty 1

## 2023-04-12 MED ORDER — ALBUTEROL SULFATE (2.5 MG/3ML) 0.083% IN NEBU: 2.5000 mg | INHALATION_SOLUTION | RESPIRATORY_TRACT | Status: DC | PRN

## 2023-04-12 MED ORDER — BUDESONIDE 0.25 MG/2ML IN SUSP
0.2500 mg | Freq: Two times a day (BID) | RESPIRATORY_TRACT | Status: DC
  Administered 2023-04-12 – 2023-04-13 (×3): 0.25 mg via RESPIRATORY_TRACT
  Filled 2023-04-12 (×3): qty 2

## 2023-04-12 MED ORDER — HYDROCOD POLI-CHLORPHE POLI ER 10-8 MG/5ML PO SUER
5.0000 mL | Freq: Two times a day (BID) | ORAL | Status: DC
  Administered 2023-04-12 – 2023-04-13 (×3): 5 mL via ORAL
  Filled 2023-04-12 (×3): qty 5

## 2023-04-12 MED ORDER — BENZONATATE 100 MG PO CAPS
200.0000 mg | ORAL_CAPSULE | Freq: Three times a day (TID) | ORAL | Status: DC
  Administered 2023-04-12 – 2023-04-13 (×4): 200 mg via ORAL
  Filled 2023-04-12 (×6): qty 2

## 2023-04-12 MED ORDER — ARFORMOTEROL TARTRATE 15 MCG/2ML IN NEBU
15.0000 ug | INHALATION_SOLUTION | Freq: Two times a day (BID) | RESPIRATORY_TRACT | Status: DC
  Administered 2023-04-12 – 2023-04-13 (×3): 15 ug via RESPIRATORY_TRACT
  Filled 2023-04-12 (×3): qty 2

## 2023-04-12 NOTE — ED Notes (Signed)
ED TO INPATIENT HANDOFF REPORT  Name/Age/Gender Tracy Park 67 y.o. female  Code Status    Code Status Orders  (From admission, onward)           Start     Ordered   04/11/23 2132  Full code  Continuous       Question:  By:  Answer:  Consent: discussion documented in EHR   04/11/23 2132           Code Status History     Date Active Date Inactive Code Status Order ID Comments User Context   03/26/2023 0938 03/27/2023 1838 DNR [id number]  Bobette Mo, MD Inpatient   03/06/2023 2249 03/10/2023 2107 DNR [id number]  Angie Fava, DO ED   02/23/2023 2046 02/26/2023 2245 DNR [id number]  Briscoe Deutscher, MD ED   01/25/2023 2350 01/28/2023 1928 DNR [id number]  Anselm Jungling, DO Inpatient   01/08/2023 0235 01/13/2023 1928 DNR [id number]  Hillary Bow, DO Inpatient   01/08/2023 0117 01/08/2023 0235 Full Code [id number]  Hillary Bow, DO ED   03/24/2022 1159 03/28/2022 1546 Full Code [id number]  Bobette Mo, MD ED   03/14/2022 2132 03/16/2022 1602 Full Code [id number]  Karl Ito, MD Inpatient   03/14/2022 0331 03/14/2022 2132 DNR [id number]  Tomma Lightning, MD ED   03/13/2022 2149 03/14/2022 0331 DNR [id number]  Mansy, Vernetta Honey, MD ED   02/26/2022 0200 03/01/2022 1555 DNR [id number]  Anselm Jungling, DO ED   08/16/2021 2316 08/25/2021 2324 DNR [id number]  John Giovanni, MD ED   08/16/2021 2253 08/16/2021 2316 Full Code [id number]  John Giovanni, MD ED   05/03/2021 0052 05/05/2021 1756 Full Code [id number]  Eduard Clos, MD ED   04/10/2020 0342 04/12/2020 2137 Full Code [id number]  Eduard Clos, MD ED   04/28/2012 0926 04/28/2012 2047 Full Code [id number]  Kelby Aline, RN Inpatient       Home/SNF/Other Home  Chief Complaint COPD exacerbation [facility]) [J44.1]  Level of Care/Admitting Diagnosis ED Disposition     ED Disposition  Admit   Condition  --   Comment  Hospital Area: [facility] [[id number]]  Level of Care: Med-Surg [16]  May  admit patient to [facility] or [facility] if equivalent level of care is available:: No  Covid Evaluation: Confirmed COVID Negative  Diagnosis: COPD exacerbation [facility]) [[id number]]  Admitting Physician: Janann Colonel  Attending Physician: RAI, RIPUDEEP K [[id number]]  Certification:: I certify this patient will need inpatient services for at least 2 midnights  Estimated Length of Stay: 2          Medical History Past Medical History:  Diagnosis Date   Anginal pain (HCC)    Anxiety    Bipolar disorder (HCC)    Cancer (HCC)    COPD (chronic obstructive pulmonary disease) (HCC)    Dyspnea    Family history of adverse reaction to anesthesia    History of kidney stones    Hydroureteronephrosis 08/16/2021   Hypothyroidism    Lung cancer (HCC)    Myocardial infarction (HCC)    Paroxysmal atrial fibrillation (HCC)    PTSD (post-traumatic stress disorder)    Sleep apnea    Thyroid disease     Allergies Allergies  Allergen Reactions   Red Dye Hives, Itching and Other (See Comments)    Red food dye   Oxycontin [Oxycodone Hcl] Other (See Comments)    Hallucinations    Prednisone Hives and  Nausea And Vomiting   Aspirin Hives   Strawberry Extract Hives and Itching   Tomato Hives and Itching   Tape Rash and Other (See Comments)    Prefers paper tape   Wound Dressing Adhesive Rash    IV Location/Drains/Wounds Patient Lines/Drains/Airways Status     Active Line/Drains/Airways     Name Placement date Placement time Site Days   Peripheral IV 04/11/23 20 G 1" Anterior;Distal;Right Forearm 04/11/23  1812  Forearm  1            Labs/Imaging Results for orders placed or performed during the hospital encounter of 04/11/23 (from the past 48 hour(s))  CBC with Differential     Status: Abnormal   Collection Time: 04/11/23  6:13 PM  Result Value Ref Range   WBC 11.4 (H) 4.0 - 10.5 K/uL   RBC 4.35 3.87 - 5.11 MIL/uL   Hemoglobin 12.1 12.0 - 15.0 g/dL   HCT 16.1 09.6 -  04.5 %   MCV 95.9 80.0 - 100.0 fL   MCH 27.8 26.0 - 34.0 pg   MCHC 29.0 (L) 30.0 - 36.0 g/dL   RDW 40.9 81.1 - 91.4 %   Platelets 212 150 - 400 K/uL   nRBC 0.0 0.0 - 0.2 %   Neutrophils Relative % 81 %   Neutro Abs 9.1 (H) 1.7 - 7.7 K/uL   Lymphocytes Relative 15 %   Lymphs Abs 1.7 0.7 - 4.0 K/uL   Monocytes Relative 4 %   Monocytes Absolute 0.4 0.1 - 1.0 K/uL   Eosinophils Relative 0 %   Eosinophils Absolute 0.0 0.0 - 0.5 K/uL   Basophils Relative 0 %   Basophils Absolute 0.0 0.0 - 0.1 K/uL   Immature Granulocytes 0 %   Abs Immature Granulocytes 0.05 0.00 - 0.07 K/uL    Comment: Performed at [facility], [address] [place], [place] [address]  Comprehensive metabolic panel     Status: Abnormal   Collection Time: 04/11/23  6:13 PM  Result Value Ref Range   Sodium 141 135 - 145 mmol/L   Potassium 3.5 3.5 - 5.1 mmol/L   Chloride 97 (L) 98 - 111 mmol/L   CO2 36 (H) 22 - 32 mmol/L   Glucose, Bld 101 (H) 70 - 99 mg/dL    Comment: Glucose reference range applies only to samples taken after fasting for at least 8 hours.   BUN 18 8 - 23 mg/dL   Creatinine, Ser 6.21 0.44 - 1.00 mg/dL   Calcium 8.7 (L) 8.9 - 10.3 mg/dL   Total Protein 6.4 (L) 6.5 - 8.1 g/dL   Albumin 3.6 3.5 - 5.0 g/dL   AST 17 15 - 41 U/L   ALT 18 0 - 44 U/L   Alkaline Phosphatase 59 38 - 126 U/L   Total Bilirubin 0.4 0.3 - 1.2 mg/dL   GFR, Estimated >30 >86 mL/min    Comment: (NOTE) Calculated using the CKD-EPI Creatinine Equation (2021)    Anion gap 8 5 - 15    Comment: Performed at [facility], [address] [place], [place] [address]  Resp panel by RT-PCR (RSV, Flu A&B, Covid) Anterior Nasal Swab     Status: None   Collection Time: 04/11/23  8:53 PM   Specimen: Anterior Nasal Swab  Result Value Ref Range   SARS Coronavirus 2 by RT PCR NEGATIVE NEGATIVE    Comment: (NOTE) SARS-CoV-2 target nucleic acids are NOT DETECTED.  The SARS-CoV-2 RNA is  generally  detectable in upper respiratory specimens during the acute phase of infection. The lowest concentration of SARS-CoV-2 viral copies this assay can detect is 138 copies/mL. A negative result does not preclude SARS-Cov-2 infection and should not be used as the sole basis for treatment or other patient management decisions. A negative result may occur with  improper specimen collection/handling, submission of specimen other than nasopharyngeal swab, presence of viral mutation(s) within the areas targeted by this assay, and inadequate number of viral copies(<138 copies/mL). A negative result must be combined with clinical observations, patient history, and epidemiological information. The expected result is Negative.  Fact Sheet for Patients:  BloggerCourse.com  Fact Sheet for Healthcare Providers:  SeriousBroker.it  This test is no t yet approved or cleared by the [place] FDA and  has been authorized for detection and/or diagnosis of SARS-CoV-2 by FDA under an Emergency Use Authorization (EUA). This EUA will remain  in effect (meaning this test can be used) for the duration of the COVID-19 declaration under Section 564(b)(1) of the Act, 21 U.S.C.section 360bbb-3(b)(1), unless the authorization is terminated  or revoked sooner.       Influenza A by PCR NEGATIVE NEGATIVE   Influenza B by PCR NEGATIVE NEGATIVE    Comment: (NOTE) The Xpert Xpress SARS-CoV-2/FLU/RSV plus assay is intended as an aid in the diagnosis of influenza from Nasopharyngeal swab specimens and should not be used as a sole basis for treatment. Nasal washings and aspirates are unacceptable for Xpert Xpress SARS-CoV-2/FLU/RSV testing.  Fact Sheet for Patients: BloggerCourse.com  Fact Sheet for Healthcare Providers: SeriousBroker.it  This test is not yet approved or cleared by the [place] FDA and has  been authorized for detection and/or diagnosis of SARS-CoV-2 by FDA under an Emergency Use Authorization (EUA). This EUA will remain in effect (meaning this test can be used) for the duration of the COVID-19 declaration under Section 564(b)(1) of the Act, 21 U.S.C. section 360bbb-3(b)(1), unless the authorization is terminated or revoked.     Resp Syncytial Virus by PCR NEGATIVE NEGATIVE    Comment: (NOTE) Fact Sheet for Patients: BloggerCourse.com  Fact Sheet for Healthcare Providers: SeriousBroker.it  This test is not yet approved or cleared by the [place] FDA and has been authorized for detection and/or diagnosis of SARS-CoV-2 by FDA under an Emergency Use Authorization (EUA). This EUA will remain in effect (meaning this test can be used) for the duration of the COVID-19 declaration under Section 564(b)(1) of the Act, 21 U.S.C. section 360bbb-3(b)(1), unless the authorization is terminated or revoked.  Performed at [facility], [address] [place], [place] [address]   Comprehensive metabolic panel     Status: Abnormal   Collection Time: 04/12/23  5:00 AM  Result Value Ref Range   Sodium 139 135 - 145 mmol/L   Potassium 3.8 3.5 - 5.1 mmol/L   Chloride 101 98 - 111 mmol/L   CO2 33 (H) 22 - 32 mmol/L   Glucose, Bld 151 (H) 70 - 99 mg/dL    Comment: Glucose reference range applies only to samples taken after fasting for at least 8 hours.   BUN 22 8 - 23 mg/dL   Creatinine, Ser 6.21 0.44 - 1.00 mg/dL   Calcium 8.0 (L) 8.9 - 10.3 mg/dL   Total Protein 5.8 (L) 6.5 - 8.1 g/dL   Albumin 3.0 (L) 3.5 - 5.0 g/dL   AST 14 (L) 15 - 41 U/L   ALT 18 0 - 44 U/L  Alkaline Phosphatase 53 38 - 126 U/L   Total Bilirubin 0.3 0.3 - 1.2 mg/dL   GFR, Estimated >29 >56 mL/min    Comment: (NOTE) Calculated using the CKD-EPI Creatinine Equation (2021)    Anion gap 5 5 - 15    Comment: Performed at [facility], [address] [place], [place] [address]  CBC     Status: Abnormal   Collection Time: 04/12/23  5:00 AM  Result Value Ref Range   WBC 5.8 4.0 - 10.5 K/uL   RBC 3.95 3.87 - 5.11 MIL/uL   Hemoglobin 11.0 (L) 12.0 - 15.0 g/dL   HCT 65.7 84.6 - 96.2 %   MCV 94.4 80.0 - 100.0 fL   MCH 27.8 26.0 - 34.0 pg   MCHC 29.5 (L) 30.0 - 36.0 g/dL   RDW 95.2 84.1 - 32.4 %   Platelets 195 150 - 400 K/uL   nRBC 0.0 0.0 - 0.2 %    Comment: Performed at [facility], [address], [place], [place] [address]   DG Foot Complete Left  Result Date: 04/11/2023 CLINICAL DATA:  Dropped heavy object on foot. EXAM: LEFT FOOT - COMPLETE 3+ VIEW COMPARISON:  None Available. FINDINGS: There is no evidence of fracture or dislocation. There is no evidence of arthropathy or other focal bone abnormality. Soft tissues are unremarkable. IMPRESSION: Negative. Electronically Signed   By: Charlett Nose M.D.   On: 04/11/2023 20:17   DG Chest Port 1 View  Result Date: 04/11/2023 CLINICAL DATA:  Shortness of breath. Productive cough. History of known lung cancer. Prior left upper lobectomy. EXAM: PORTABLE CHEST 1 VIEW COMPARISON:  X-ray 03/26/2023 FINDINGS: Once again surgical changes from left-sided upper lobectomy with volume loss, retraction of the hilum and apical pleural thickening. Chronic lung changes elsewhere. No pneumothorax, edema. Normal cardiopericardial silhouette. Overlapping cardiac leads. IMPRESSION: Stable surgical changes from left lobectomy with volume loss, scarring and pleural thickening. Chronic underlying lung changes elsewhere.  No new consolidation. Electronically Signed   By: Karen Kays M.D.   On: 04/11/2023 17:41    Pending Labs Unresulted Labs (From admission, onward)     Start     Ordered   04/13/23 0500  CBC  Daily,   R      04/12/23 0738   04/13/23 0500  Basic metabolic panel  Daily,   R      04/12/23 0738   04/11/23 2258  Expectorated Sputum Assessment  w Gram Stain, Rflx to Resp Cult  Once,   R        04/11/23 2257            Vitals/Pain Today's Vitals   04/12/23 1200 04/12/23 1300 04/12/23 1330 04/12/23 1400  BP: 107/69 114/66 104/73 123/81  Pulse: 74 69 78 79  Resp: 18 16 19 18   Temp:    97.7 F (36.5 C)  TempSrc:      SpO2: 100% 97% 98% 96%  Weight:      Height:        Isolation Precautions No active isolations  Medications Medications  losartan (COZAAR) tablet 12.5 mg (12.5 mg Oral Given 04/12/23 0810)  metoprolol succinate (TOPROL-XL) 24 hr tablet 12.5 mg (12.5 mg Oral Given 04/12/23 1107)  nitroGLYCERIN (NITROSTAT) SL tablet 0.4 mg (has no administration in time range)  rosuvastatin (CRESTOR) tablet 20 mg (20 mg Oral Given 04/12/23 1107)  levothyroxine (SYNTHROID) tablet 125 mcg (125 mcg Oral Given 04/12/23 0558)  magnesium oxide (MAG-OX)  tablet 400 mg (400 mg Oral Given 04/12/23 1108)  pantoprazole (PROTONIX) EC tablet 40 mg (40 mg Oral Given 04/12/23 0810)  apixaban (ELIQUIS) tablet 5 mg (5 mg Oral Given 04/12/23 1108)  sodium chloride flush (NS) 0.9 % injection 3 mL (3 mLs Intravenous Given 04/12/23 1116)  acetaminophen (TYLENOL) tablet 650 mg (has no administration in time range)    Or  acetaminophen (TYLENOL) suppository 650 mg (has no administration in time range)  morphine (PF) 2 MG/ML injection 1 mg (has no administration in time range)  traZODone (DESYREL) tablet 25 mg (has no administration in time range)  bisacodyl (DULCOLAX) EC tablet 5 mg (has no administration in time range)  ondansetron (ZOFRAN) tablet 4 mg (has no administration in time range)    Or  ondansetron (ZOFRAN) injection 4 mg (has no administration in time range)  hydrALAZINE (APRESOLINE) injection 10 mg (has no administration in time range)  ipratropium (ATROVENT) nebulizer solution 0.5 mg (has no administration in time range)  traMADol (ULTRAM) tablet 50 mg (has no administration in time range)  methylPREDNISolone sodium succinate (SOLU-MEDROL) 125  mg/2 mL injection 60 mg (60 mg Intravenous Given 04/12/23 1106)  azithromycin (ZITHROMAX) 500 mg in sodium chloride 0.9 % 250 mL IVPB (0 mg Intravenous Stopped 04/12/23 0033)  budesonide (PULMICORT) nebulizer solution 0.25 mg (0.25 mg Nebulization Given 04/12/23 0833)  arformoterol (BROVANA) nebulizer solution 15 mcg (15 mcg Nebulization Given 04/12/23 0831)  ipratropium-albuterol (DUONEB) 0.5-2.5 (3) MG/3ML nebulizer solution 3 mL (3 mLs Nebulization Given 04/12/23 1156)  albuterol (PROVENTIL) (2.5 MG/3ML) 0.083% nebulizer solution 2.5 mg (has no administration in time range)  benzonatate (TESSALON) capsule 200 mg (200 mg Oral Given 04/12/23 1107)  chlorpheniramine-HYDROcodone (TUSSIONEX) 10-8 MG/5ML suspension 5 mL (5 mLs Oral Given 04/12/23 1106)  ALPRAZolam (XANAX) tablet 0.5 mg (has no administration in time range)  ipratropium-albuterol (DUONEB) 0.5-2.5 (3) MG/3ML nebulizer solution 3 mL (3 mLs Nebulization Given 04/11/23 1806)  albuterol (PROVENTIL) (2.5 MG/3ML) 0.083% nebulizer solution 2.5 mg (2.5 mg Nebulization Given 04/11/23 1806)  magnesium sulfate IVPB 2 g 50 mL (0 g Intravenous Stopped 04/11/23 1928)  methylPREDNISolone sodium succinate (SOLU-MEDROL) 125 mg/2 mL injection 125 mg (125 mg Intravenous Given 04/11/23 1812)  sodium chloride 0.9 % bolus 1,000 mL (0 mLs Intravenous Stopped 04/11/23 2055)    Mobility walks

## 2023-04-12 NOTE — Progress Notes (Signed)
[facility]                                                                              Tracy Park, is a 67 y.o. female, DOB - Jan 12, 1956, NGE:[id number] Admit date - 04/11/2023    Outpatient Primary MD for the patient is Jacquelin Hawking, PA-C  LOS - 0  days  No chief complaint on file.      Brief summary   Patient is a 67 y.o. female with a known history of CAD, bipolar disorder, anxiety, PTSD, COPD on home oxygen at 2 L, lung cancer, sleep apnea, nephrolithiasis, hydroureteronephrosis, hypothyroidism, presented to ED  progressively worsening shortness of breath and wheezing.   Of note she was recently hospitalized for COPD exacerbation   EMS/ED Course: Patient received Solumedrol, mag sulfate, NS, Duoneb. Medical admission has been requested for further management of Acute hypoxic resp failure 2/2 COPD exacerbation.  Due to persistent wheezing, patient was admitted for further workup  Assessment & Plan    Principal Problem:   Acute on chronic respiratory failure with hypoxia (HCC) -Underlying history of lung CA, COPD, O2 dependent on 2 L O2 at home at baseline -At the time of my encounter on 5 L O2, O2 sats 100%, weaned down to 3 L -Wean O2 as tolerated, goal O2 sats 89 to 92%  Active Problems:   COPD exacerbation (HCC) -Diffusely wheezing with cough  -Continue DuoNebs qid, IV Solu-Medrol 60 mg every 6 hours, changed to Brovana, Pulmicort -Continue IV Zithromax -Having coughing spells, placed on Tessalon Perles, Tussionex every 12 hours    Left foot pain -Per patient, she had dropped the oxygen cylinder on her left foot before coming to the hospital -Left foot x-ray negative for any fracture or dislocation. -  Continue pain control    Acquired hypothyroidism -Continue Synthroid -TSH 0.8, last checked on 03/07/2023    History of lung cancer 2018 -Status post left upper lobe excision, chemo, oncologist at [facility]  CAD, hyperlipidemia -Currently  no acute chest pain, continue metoprolol, statin, eliquis     Normocytic anemia - baseline ~12 -H&H close to baseline    Essential hypertension -BP stable, continue Toprol, losartan    Paroxysmal atrial fibrillation (HCC) -Rate controlled, continue Toprol-XL -Continue apixaban   Estimated body mass index is 19.29 kg/m as calculated from the following:   Height as of this encounter: 5\' 3"  (1.6 m).   Weight as of this encounter: 49.4 kg.  Code Status: Full code DVT Prophylaxis:  SCDs Start: 04/11/23 2258 apixaban (ELIQUIS) tablet 5 mg   Level of Care: Level of care: Med-Surg Family Communication: Updated patient Disposition Plan:      Remains inpatient appropriate: Diffusely wheezing, hypoxia on 5 L at encounter, not ready for discharge.   Procedures:  None  Consultants:   None  Antimicrobials:   Anti-infectives (From admission, onward)    Start     Dose/Rate Route Frequency Ordered Stop   04/11/23 2300  azithromycin (ZITHROMAX) 500 mg in sodium chloride 0.9 % 250 mL IVPB        500 mg 250 mL/hr over 60 Minutes Intravenous Every 24  hours 04/11/23 2252            Medications  apixaban  5 mg Oral BID   arformoterol  15 mcg Nebulization BID   benzonatate  200 mg Oral TID   budesonide (PULMICORT) nebulizer solution  0.25 mg Nebulization BID   chlorpheniramine-HYDROcodone  5 mL Oral Q12H   ipratropium-albuterol  3 mL Nebulization QID   levothyroxine  125 mcg Oral QAC breakfast   losartan  12.5 mg Oral q AM   magnesium oxide  400 mg Oral Daily   methylPREDNISolone (SOLU-MEDROL) injection  60 mg Intravenous Q6H   metoprolol succinate  12.5 mg Oral Daily   pantoprazole  40 mg Oral QAC breakfast   rosuvastatin  20 mg Oral Daily   sodium chloride flush  3 mL Intravenous Q12H      Subjective:   Tracy Park was seen and examined today.  Diffusely wheezing with coughing spells.  No fevers or chills.  No nausea vomiting or abdominal pain.  Having left foot  pain, states had to drop the oxygen cylinder on her foot before coming to the hospital.  Objective:   Vitals:   04/12/23 0831 04/12/23 0832 04/12/23 0948 04/12/23 1000  BP:    115/66  Pulse:    77  Resp:    (!) 21  Temp:   97.7 F (36.5 C)   TempSrc:      SpO2: 94% 99%  96%  Weight:      Height:        Intake/Output Summary (Last 24 hours) at 04/12/2023 1021 Last data filed at 04/12/2023 0033 Gross per 24 hour  Intake 247.49 ml  Output --  Net 247.49 ml     Wt Readings from Last 3 Encounters:  04/11/23 49.4 kg  03/26/23 49.4 kg  03/09/23 49.4 kg     Exam General: Alert and oriented x 3, NAD Cardiovascular: S1 S2 auscultated,  RRR Respiratory: Bilateral diffuse expiratory wheezing Gastrointestinal: Soft, nontender, nondistended, + bowel sounds Ext: no pedal edema bilaterally.  Left foot examined, tenderness on the metatarsals otherwise ROM WNL, no swelling or redness. Neuro: no new deficits Skin: No rashes Psych: Normal affect     Data Reviewed:  I have personally reviewed following labs    CBC Lab Results  Component Value Date   WBC 5.8 04/12/2023   RBC 3.95 04/12/2023   HGB 11.0 (L) 04/12/2023   HCT 37.3 04/12/2023   MCV 94.4 04/12/2023   MCH 27.8 04/12/2023   PLT 195 04/12/2023   MCHC 29.5 (L) 04/12/2023   RDW 14.3 04/12/2023   LYMPHSABS 1.7 04/11/2023   MONOABS 0.4 04/11/2023   EOSABS 0.0 04/11/2023   BASOSABS 0.0 04/11/2023     Last metabolic panel Lab Results  Component Value Date   NA 139 04/12/2023   K 3.8 04/12/2023   CL 101 04/12/2023   CO2 33 (H) 04/12/2023   BUN 22 04/12/2023   CREATININE 0.55 04/12/2023   GLUCOSE 151 (H) 04/12/2023   GFRNONAA >60 04/12/2023   GFRAA >60 04/12/2020   CALCIUM 8.0 (L) 04/12/2023   PHOS 3.7 03/09/2023   PROT 5.8 (L) 04/12/2023   ALBUMIN 3.0 (L) 04/12/2023   BILITOT 0.3 04/12/2023   ALKPHOS 53 04/12/2023   AST 14 (L) 04/12/2023   ALT 18 04/12/2023   ANIONGAP 5 04/12/2023    CBG (last 3)  No  results for input(s): "GLUCAP" in the last 72 hours.    Coagulation Profile: No results for input(s): "INR", "  PROTIME" in the last 168 hours.   Radiology Studies: I have personally reviewed the imaging studies  DG Foot Complete Left  Result Date: 04/11/2023 CLINICAL DATA:  Dropped heavy object on foot. EXAM: LEFT FOOT - COMPLETE 3+ VIEW COMPARISON:  None Available. FINDINGS: There is no evidence of fracture or dislocation. There is no evidence of arthropathy or other focal bone abnormality. Soft tissues are unremarkable. IMPRESSION: Negative. Electronically Signed   By: Charlett Nose M.D.   On: 04/11/2023 20:17   DG Chest Port 1 View  Result Date: 04/11/2023 CLINICAL DATA:  Shortness of breath. Productive cough. History of known lung cancer. Prior left upper lobectomy. EXAM: PORTABLE CHEST 1 VIEW COMPARISON:  X-ray 03/26/2023 FINDINGS: Once again surgical changes from left-sided upper lobectomy with volume loss, retraction of the hilum and apical pleural thickening. Chronic lung changes elsewhere. No pneumothorax, edema. Normal cardiopericardial silhouette. Overlapping cardiac leads. IMPRESSION: Stable surgical changes from left lobectomy with volume loss, scarring and pleural thickening. Chronic underlying lung changes elsewhere.  No new consolidation. Electronically Signed   By: Karen Kays M.D.   On: 04/11/2023 17:41         M.D. [facility] 04/12/2023, 10:21 AM  Available via Epic secure chat 7am-7pm After 7 pm, please refer to night coverage provider listed on amion.

## 2023-04-12 NOTE — Plan of Care (Signed)
  Problem: Education: °Goal: Knowledge of General Education information will improve °Description: Including pain rating scale, medication(s)/side effects and non-pharmacologic comfort measures °Outcome: Progressing °  °Problem: Health Behavior/Discharge Planning: °Goal: Ability to manage health-related needs will improve °Outcome: Progressing °  °Problem: Clinical Measurements: °Goal: Ability to maintain clinical measurements within normal limits will improve °Outcome: Progressing °Goal: Will remain free from infection °Outcome: Progressing °Goal: Diagnostic test results will improve °Outcome: Progressing °  °Problem: Clinical Measurements: °Goal: Will remain free from infection °Outcome: Progressing °  °Problem: Clinical Measurements: °Goal: Diagnostic test results will improve °Outcome: Progressing °  °

## 2023-04-13 DIAGNOSIS — J441 Chronic obstructive pulmonary disease with (acute) exacerbation: Secondary | ICD-10-CM

## 2023-04-13 DIAGNOSIS — M79672 Pain in left foot: Secondary | ICD-10-CM

## 2023-04-13 DIAGNOSIS — J9621 Acute and chronic respiratory failure with hypoxia: Secondary | ICD-10-CM | POA: Diagnosis not present

## 2023-04-13 LAB — CBC
HCT: 35.9 % — ABNORMAL LOW (ref 36.0–46.0)
Hemoglobin: 10.7 g/dL — ABNORMAL LOW (ref 12.0–15.0)
MCH: 28.4 pg (ref 26.0–34.0)
MCHC: 29.8 g/dL — ABNORMAL LOW (ref 30.0–36.0)
MCV: 95.2 fL (ref 80.0–100.0)
Platelets: 178 10*3/uL (ref 150–400)
RBC: 3.77 MIL/uL — ABNORMAL LOW (ref 3.87–5.11)
RDW: 14.1 % (ref 11.5–15.5)
WBC: 10.1 10*3/uL (ref 4.0–10.5)
nRBC: 0 % (ref 0.0–0.2)

## 2023-04-13 LAB — BASIC METABOLIC PANEL WITH GFR
Anion gap: 5 (ref 5–15)
BUN: 24 mg/dL — ABNORMAL HIGH (ref 8–23)
CO2: 34 mmol/L — ABNORMAL HIGH (ref 22–32)
Calcium: 8.4 mg/dL — ABNORMAL LOW (ref 8.9–10.3)
Chloride: 101 mmol/L (ref 98–111)
Creatinine, Ser: 0.62 mg/dL (ref 0.44–1.00)
GFR, Estimated: >60 mL/min (ref >60)
Glucose, Bld: 157 mg/dL — ABNORMAL HIGH (ref 70–99)
Potassium: 4.2 mmol/L (ref 3.5–5.1)
Sodium: 140 mmol/L (ref 135–145)

## 2023-04-13 LAB — RESPIRATORY PANEL BY PCR

## 2023-04-13 MED ORDER — PREDNISONE 20 MG PO TABS: 40.0000 mg | ORAL_TABLET | Freq: Every day | ORAL | 0 refills | Status: DC

## 2023-04-13 NOTE — Progress Notes (Addendum)
Travel oxygen delivered. Patient left unit via wheelchair. No signs or symptoms of acute distress at the time of discharge.

## 2023-04-13 NOTE — Progress Notes (Signed)
Patient given taxi voucher. [employer] was called pending arrival.

## 2023-04-13 NOTE — Plan of Care (Addendum)
Patient stable for discharge as per MD order. Discharge delayed due to patient not having enough oxygen in her personal oxygen tank. Case management consulted and MD aware.  Problem: Education: Goal: Knowledge of General Education information will improve Description: Including pain rating scale, medication(s)/side effects and non-pharmacologic comfort measures Outcome: Adequate for Discharge   Problem: Health Behavior/Discharge Planning: Goal: Ability to manage health-related needs will improve Outcome: Adequate for Discharge   Problem: Clinical Measurements: Goal: Ability to maintain clinical measurements within normal limits will improve Outcome: Adequate for Discharge Goal: Will remain free from infection Outcome: Adequate for Discharge Goal: Diagnostic test results will improve Outcome: Adequate for Discharge Goal: Respiratory complications will improve Outcome: Adequate for Discharge Goal: Cardiovascular complication will be avoided Outcome: Adequate for Discharge   Problem: Activity: Goal: Risk for activity intolerance will decrease Outcome: Adequate for Discharge   Problem: Nutrition: Goal: Adequate nutrition will be maintained Outcome: Adequate for Discharge   Problem: Coping: Goal: Level of anxiety will decrease Outcome: Adequate for Discharge   Problem: Elimination: Goal: Will not experience complications related to bowel motility Outcome: Adequate for Discharge Goal: Will not experience complications related to urinary retention Outcome: Adequate for Discharge   Problem: Pain Managment: Goal: General experience of comfort will improve Outcome: Adequate for Discharge   Problem: Safety: Goal: Ability to remain free from injury will improve Outcome: Adequate for Discharge   Problem: Skin Integrity: Goal: Risk for impaired skin integrity will decrease Outcome: Adequate for Discharge

## 2023-04-13 NOTE — Discharge Summary (Signed)
Physician Discharge Summary   Tracy Park WRU:[id number] DOB: 06-28-1956 DOA: 04/11/2023  PCP: Jacquelin Hawking, PA-C  Admit date: 04/11/2023 Discharge date: 04/13/2023  Admitted From: Home Disposition:  Home Discharging physician: [name], MD Barriers to discharge: none  Recommendations at discharge: Continue chronic medical management Encouraged to quit smoking  Discharge Condition: stable CODE STATUS: Full Diet recommendation:  Diet Orders (From admission, onward)     Start     Ordered   04/12/23 0739  DIET SOFT Room service appropriate? Yes; Fluid consistency: Thin  Diet effective now       Question Answer Comment  Room service appropriate? Yes   Fluid consistency: Thin      04/12/23 0738            Hospital Course:   Acute on chronic respiratory failure with hypoxia (HCC) -Underlying history of lung CA s/p LUL lobectomy 2018, COPD, O2 dependent on 2 L O2 at home at baseline -Initially required 5 L oxygen on admission, able to be weaned down back to home settings  COPD exacerbation (HCC) -Diffusely wheezing with cough  -Still smoking.  Cessation has been strongly recommended - Treated with nebulizers and steroids during hospitalization - Continue on prednisone course to complete at discharge   Left foot pain -Per patient, she had dropped the oxygen cylinder on her left foot before coming to the hospital -Left foot x-ray negative for any fracture or dislocation. No open wounds - PT consulted given difficulty ambulating; camboot being ordered prior to discharge   Acquired hypothyroidism -Continue Synthroid -TSH 0.8, last checked on 03/07/2023   History of lung cancer 2018 -Status post left upper lobe excision, chemo, oncologist at [facility]   CAD, hyperlipidemia -Currently no acute chest pain, continue metoprolol, statin, eliquis   Normocytic anemia - baseline ~12 -H&H close to baseline   Essential hypertension -BP stable, continue Toprol,  losartan   Paroxysmal atrial fibrillation (HCC) -Rate controlled, continue Toprol-XL -Continue apixaban    The patient's chronic medical conditions were treated accordingly per the patient's home medication regimen except as noted.  On day of discharge, patient was felt deemed stable for discharge. Patient/family member advised to call PCP or come back to ER if needed.   Principal Diagnosis: Acute on chronic respiratory failure with hypoxia [facility])  Discharge Diagnoses: Active Hospital Problems   Diagnosis Date Noted   Acute on chronic respiratory failure with hypoxia (HCC) 04/10/2020   COPD exacerbation (HCC) 03/25/2022    Priority: High   History of lung cancer 03/13/2022    Priority: Low   Acquired hypothyroidism 04/28/2012    Priority: Low   Tobacco abuse 03/13/2022    Priority: 5.   Left foot pain 04/12/2023   Paroxysmal atrial fibrillation (HCC) 02/23/2023   Essential hypertension 01/25/2023   Normocytic anemia 02/08/2022   Coronary artery disease involving native coronary artery of native heart without angina pectoris 08/18/2018   Malignant neoplasm of upper lobe of left lung (HCC) 11/12/2016   Obstructive sleep apnea (adult) (pediatric) 02/22/2015    Resolved Hospital Problems  No resolved problems to display.     Discharge Instructions     Increase activity slowly   Complete by: As directed       Allergies as of 04/13/2023       Reactions   Red Dye Hives, Itching, Other (See Comments)   Red food dye   Oxycontin [oxycodone Hcl] Other (See Comments)   Hallucinations    Prednisone Hives, Nausea And Vomiting  Aspirin Hives   Strawberry Extract Hives, Itching   Tomato Hives, Itching   Tape Rash, Other (See Comments)   Prefers paper tape   Wound Dressing Adhesive Rash        Medication List     TAKE these medications    acetaminophen 500 MG tablet Commonly known as: TYLENOL Take 500 mg by mouth every 6 (six) hours as needed for moderate pain.    albuterol 108 (90 Base) MCG/ACT inhaler Commonly known as: VENTOLIN HFA Inhale 2 puffs into the lungs every 6 (six) hours as needed for wheezing or shortness of breath. What changed: Another medication with the same name was changed. Make sure you understand how and when to take each.   albuterol (2.5 MG/3ML) 0.083% nebulizer solution Commonly known as: PROVENTIL Take 3 mLs (2.5 mg total) by nebulization every 6 (six) hours as needed for wheezing or shortness of breath. What changed:  when to take this additional instructions   ALPRAZolam 0.5 MG tablet Commonly known as: XANAX Take 0.5 mg by mouth 2 (two) times daily as needed for anxiety.   Breztri Aerosphere 160-9-4.8 MCG/ACT Aero Generic drug: Budeson-Glycopyrrol-Formoterol Inhale 1 puff into the lungs in the morning and at bedtime.   Eliquis 5 MG Tabs tablet Generic drug: apixaban Take 5 mg by mouth 2 (two) times daily. What changed: Another medication with the same name was removed. Continue taking this medication, and follow the directions you see here.   levothyroxine 125 MCG tablet Commonly known as: SYNTHROID Take 125 mcg by mouth daily before breakfast.   losartan 25 MG tablet Commonly known as: COZAAR Take 12.5 mg by mouth daily. What changed: Another medication with the same name was removed. Continue taking this medication, and follow the directions you see here.   Magnesium Oxide 400 MG Caps Take 1 capsule (400 mg total) by mouth daily at 12 noon. What changed: when to take this   metoprolol succinate 25 MG 24 hr tablet Commonly known as: TOPROL-XL Take 12.5 mg by mouth daily. What changed: Another medication with the same name was removed. Continue taking this medication, and follow the directions you see here.   nitroGLYCERIN 0.4 MG SL tablet Commonly known as: NITROSTAT Place 0.4 mg under the tongue every 5 (five) minutes as needed for chest pain.   OXYGEN Inhale 2 L/min into the lungs continuous.    pantoprazole 40 MG tablet Commonly known as: PROTONIX Take 40 mg by mouth daily before breakfast.   predniSONE 20 MG tablet Commonly known as: DELTASONE Take 2 tablets (40 mg total) by mouth daily with breakfast for 5 days. What changed:  medication strength how much to take how to take this when to take this additional instructions   rosuvastatin 20 MG tablet Commonly known as: CRESTOR Take 20 mg by mouth daily. What changed: Another medication with the same name was removed. Continue taking this medication, and follow the directions you see here.   Systane Complete PF 0.6 % Soln Generic drug: Propylene Glycol (PF) Place 1 drop into both eyes 3 (three) times daily as needed (for dryness).   tamsulosin 0.4 MG Caps capsule Commonly known as: FLOMAX Take 0.4 mg by mouth daily.        Allergies  Allergen Reactions   Red Dye Hives, Itching and Other (See Comments)    Red food dye   Oxycontin [Oxycodone Hcl] Other (See Comments)    Hallucinations    Prednisone Hives and Nausea And Vomiting   Aspirin  Hives   Strawberry Extract Hives and Itching   Tomato Hives and Itching   Tape Rash and Other (See Comments)    Prefers paper tape   Wound Dressing Adhesive Rash    Consultations:   Procedures:   Discharge Exam: BP 107/73 (BP Location: Left Arm)   Pulse 64   Temp 98.3 F (36.8 C) (Oral)   Resp 16   Ht 5\' 3"  (1.6 m)   Wt 49.4 kg   SpO2 95%   BMI 19.29 kg/m  Physical Exam Constitutional:      General: She is not in acute distress.    Appearance: Normal appearance.  HENT:     Head: Normocephalic and atraumatic.     Mouth/Throat:     Mouth: Mucous membranes are moist.  Eyes:     Extraocular Movements: Extraocular movements intact.  Cardiovascular:     Rate and Rhythm: Normal rate and regular rhythm.  Pulmonary:     Effort: Pulmonary effort is normal. No respiratory distress.     Breath sounds: Wheezing present.     Comments: Diffuse coarse breath  sounds bilaterally; mild wheezing Abdominal:     General: Bowel sounds are normal. There is no distension.     Palpations: Abdomen is soft.     Tenderness: There is no abdominal tenderness.  Musculoskeletal:        General: No swelling.     Cervical back: Normal range of motion and neck supple.     Comments: Bruising noted on dorsum of left foot, no open wounds, no edema  Skin:    General: Skin is warm and dry.  Neurological:     General: No focal deficit present.     Mental Status: She is alert.  Psychiatric:        Mood and Affect: Mood normal.      The results of significant diagnostics from this hospitalization (including imaging, microbiology, ancillary and laboratory) are listed below for reference.   Microbiology: Recent Results (from the past 240 hour(s))  Resp panel by RT-PCR (RSV, Flu A&B, Covid) Anterior Nasal Swab     Status: None   Collection Time: 04/11/23  8:53 PM   Specimen: Anterior Nasal Swab  Result Value Ref Range Status   SARS Coronavirus 2 by RT PCR NEGATIVE NEGATIVE Final    Comment: (NOTE) SARS-CoV-2 target nucleic acids are NOT DETECTED.  The SARS-CoV-2 RNA is generally detectable in upper respiratory specimens during the acute phase of infection. The lowest concentration of SARS-CoV-2 viral copies this assay can detect is 138 copies/mL. A negative result does not preclude SARS-Cov-2 infection and should not be used as the sole basis for treatment or other patient management decisions. A negative result may occur with  improper specimen collection/handling, submission of specimen other than nasopharyngeal swab, presence of viral mutation(s) within the areas targeted by this assay, and inadequate number of viral copies(<138 copies/mL). A negative result must be combined with clinical observations, patient history, and epidemiological information. The expected result is Negative.  Fact Sheet for Patients:   BloggerCourse.com  Fact Sheet for Healthcare Providers:  SeriousBroker.it  This test is no t yet approved or cleared by the [place] FDA and  has been authorized for detection and/or diagnosis of SARS-CoV-2 by FDA under an Emergency Use Authorization (EUA). This EUA will remain  in effect (meaning this test can be used) for the duration of the COVID-19 declaration under Section 564(b)(1) of the Act, 21 U.S.C.section 360bbb-3(b)(1), unless the authorization is  terminated  or revoked sooner.       Influenza A by PCR NEGATIVE NEGATIVE Final   Influenza B by PCR NEGATIVE NEGATIVE Final    Comment: (NOTE) The Xpert Xpress SARS-CoV-2/FLU/RSV plus assay is intended as an aid in the diagnosis of influenza from Nasopharyngeal swab specimens and should not be used as a sole basis for treatment. Nasal washings and aspirates are unacceptable for Xpert Xpress SARS-CoV-2/FLU/RSV testing.  Fact Sheet for Patients: BloggerCourse.com  Fact Sheet for Healthcare Providers: SeriousBroker.it  This test is not yet approved or cleared by the [place] FDA and has been authorized for detection and/or diagnosis of SARS-CoV-2 by FDA under an Emergency Use Authorization (EUA). This EUA will remain in effect (meaning this test can be used) for the duration of the COVID-19 declaration under Section 564(b)(1) of the Act, 21 U.S.C. section 360bbb-3(b)(1), unless the authorization is terminated or revoked.     Resp Syncytial Virus by PCR NEGATIVE NEGATIVE Final    Comment: (NOTE) Fact Sheet for Patients: BloggerCourse.com  Fact Sheet for Healthcare Providers: SeriousBroker.it  This test is not yet approved or cleared by the [place] FDA and has been authorized for detection and/or diagnosis of SARS-CoV-2 by FDA under an Emergency Use  Authorization (EUA). This EUA will remain in effect (meaning this test can be used) for the duration of the COVID-19 declaration under Section 564(b)(1) of the Act, 21 U.S.C. section 360bbb-3(b)(1), unless the authorization is terminated or revoked.  Performed at [facility], [address] [place], [place] [address]   Expectorated Sputum Assessment w Gram Stain, Rflx to Resp Cult     Status: None   Collection Time: 04/13/23  6:30 AM   Specimen: Expectorated Sputum  Result Value Ref Range Status   Specimen Description EXPECTORATED SPUTUM  Final   Special Requests NONE  Final   Sputum evaluation   Final    THIS SPECIMEN IS ACCEPTABLE FOR SPUTUM CULTURE Performed at [facility], [address], [place], [place] [address]    Report Status 04/13/2023 FINAL  Final     Labs: BNP (last 3 results) Recent Labs    02/23/23 1609 03/03/23 1818 03/06/23 1938  BNP 76.3 76.4 54.6   Basic Metabolic Panel: Recent Labs  Lab 04/11/23 1813 04/12/23 0500 04/13/23 0414  NA 141 139 140  K 3.5 3.8 4.2  CL 97* 101 101  CO2 36* 33* 34*  GLUCOSE 101* 151* 157*  BUN 18 22 24*  CREATININE 0.62 0.55 0.62  CALCIUM 8.7* 8.0* 8.4*   Liver Function Tests: Recent Labs  Lab 04/11/23 1813 04/12/23 0500  AST 17 14*  ALT 18 18  ALKPHOS 59 53  BILITOT 0.4 0.3  PROT 6.4* 5.8*  ALBUMIN 3.6 3.0*   No results for input(s): "LIPASE", "AMYLASE" in the last 168 hours. No results for input(s): "AMMONIA" in the last 168 hours. CBC: Recent Labs  Lab 04/11/23 1813 04/12/23 0500 04/13/23 0414  WBC 11.4* 5.8 10.1  NEUTROABS 9.1*  --   --   HGB 12.1 11.0* 10.7*  HCT 41.7 37.3 35.9*  MCV 95.9 94.4 95.2  PLT 212 195 178   Cardiac Enzymes: No results for input(s): "CKTOTAL", "CKMB", "CKMBINDEX", "TROPONINI" in the last 168 hours. BNP: Invalid input(s): "POCBNP" CBG: No results for input(s): "GLUCAP" in the last 168 hours. D-Dimer No results for  input(s): "DDIMER" in the last 72 hours. Hgb A1c No results for input(s): "HGBA1C" in the last 72 hours. Lipid  Profile No results for input(s): "CHOL", "HDL", "LDLCALC", "TRIG", "CHOLHDL", "LDLDIRECT" in the last 72 hours. Thyroid function studies No results for input(s): "TSH", "T4TOTAL", "T3FREE", "THYROIDAB" in the last 72 hours.  Invalid input(s): "FREET3" Anemia work up No results for input(s): "VITAMINB12", "FOLATE", "FERRITIN", "TIBC", "IRON", "RETICCTPCT" in the last 72 hours. Urinalysis    Component Value Date/Time   COLORURINE STRAW (A) 03/11/2022 1743   APPEARANCEUR CLEAR 03/11/2022 1743   LABSPEC 1.006 03/11/2022 1743   PHURINE 8.0 03/11/2022 1743   GLUCOSEU NEGATIVE 03/11/2022 1743   HGBUR NEGATIVE 03/11/2022 1743   BILIRUBINUR NEGATIVE 03/11/2022 1743   KETONESUR NEGATIVE 03/11/2022 1743   PROTEINUR NEGATIVE 03/11/2022 1743   UROBILINOGEN 0.2 11/06/2009 1559   NITRITE NEGATIVE 03/11/2022 1743   LEUKOCYTESUR NEGATIVE 03/11/2022 1743   Sepsis Labs Recent Labs  Lab 04/11/23 1813 04/12/23 0500 04/13/23 0414  WBC 11.4* 5.8 10.1   Microbiology Recent Results (from the past 240 hour(s))  Resp panel by RT-PCR (RSV, Flu A&B, Covid) Anterior Nasal Swab     Status: None   Collection Time: 04/11/23  8:53 PM   Specimen: Anterior Nasal Swab  Result Value Ref Range Status   SARS Coronavirus 2 by RT PCR NEGATIVE NEGATIVE Final    Comment: (NOTE) SARS-CoV-2 target nucleic acids are NOT DETECTED.  The SARS-CoV-2 RNA is generally detectable in upper respiratory specimens during the acute phase of infection. The lowest concentration of SARS-CoV-2 viral copies this assay can detect is 138 copies/mL. A negative result does not preclude SARS-Cov-2 infection and should not be used as the sole basis for treatment or other patient management decisions. A negative result may occur with  improper specimen collection/handling, submission of specimen other than nasopharyngeal  swab, presence of viral mutation(s) within the areas targeted by this assay, and inadequate number of viral copies(<138 copies/mL). A negative result must be combined with clinical observations, patient history, and epidemiological information. The expected result is Negative.  Fact Sheet for Patients:  BloggerCourse.com  Fact Sheet for Healthcare Providers:  SeriousBroker.it  This test is no t yet approved or cleared by the [place] FDA and  has been authorized for detection and/or diagnosis of SARS-CoV-2 by FDA under an Emergency Use Authorization (EUA). This EUA will remain  in effect (meaning this test can be used) for the duration of the COVID-19 declaration under Section 564(b)(1) of the Act, 21 U.S.C.section 360bbb-3(b)(1), unless the authorization is terminated  or revoked sooner.       Influenza A by PCR NEGATIVE NEGATIVE Final   Influenza B by PCR NEGATIVE NEGATIVE Final    Comment: (NOTE) The Xpert Xpress SARS-CoV-2/FLU/RSV plus assay is intended as an aid in the diagnosis of influenza from Nasopharyngeal swab specimens and should not be used as a sole basis for treatment. Nasal washings and aspirates are unacceptable for Xpert Xpress SARS-CoV-2/FLU/RSV testing.  Fact Sheet for Patients: BloggerCourse.com  Fact Sheet for Healthcare Providers: SeriousBroker.it  This test is not yet approved or cleared by the [place] FDA and has been authorized for detection and/or diagnosis of SARS-CoV-2 by FDA under an Emergency Use Authorization (EUA). This EUA will remain in effect (meaning this test can be used) for the duration of the COVID-19 declaration under Section 564(b)(1) of the Act, 21 U.S.C. section 360bbb-3(b)(1), unless the authorization is terminated or revoked.     Resp Syncytial Virus by PCR NEGATIVE NEGATIVE Final    Comment: (NOTE) Fact Sheet for  Patients: BloggerCourse.com  Fact Sheet for Healthcare Providers:  SeriousBroker.it  This test is not yet approved or cleared by the [place] and has been authorized for detection and/or diagnosis of SARS-CoV-2 by FDA under an Emergency Use Authorization (EUA). This EUA will remain in effect (meaning this test can be used) for the duration of the COVID-19 declaration under Section 564(b)(1) of the Act, 21 U.S.C. section 360bbb-3(b)(1), unless the authorization is terminated or revoked.  Performed at [facility], [address], [place], [place] [address]   Expectorated Sputum Assessment w Gram Stain, Rflx to Resp Cult     Status: None   Collection Time: 04/13/23  6:30 AM   Specimen: Expectorated Sputum  Result Value Ref Range Status   Specimen Description EXPECTORATED SPUTUM  Final   Special Requests NONE  Final   Sputum evaluation   Final    THIS SPECIMEN IS ACCEPTABLE FOR SPUTUM CULTURE Performed at [facility], [address], [place], [place] [address]    Report Status 04/13/2023 FINAL  Final    Procedures/Studies: DG Foot Complete Left  Result Date: 04/11/2023 CLINICAL DATA:  Dropped heavy object on foot. EXAM: LEFT FOOT - COMPLETE 3+ VIEW COMPARISON:  None Available. FINDINGS: There is no evidence of fracture or dislocation. There is no evidence of arthropathy or other focal bone abnormality. Soft tissues are unremarkable. IMPRESSION: Negative. Electronically Signed   By: Charlett Nose M.D.   On: 04/11/2023 20:17   DG Chest Port 1 View  Result Date: 04/11/2023 CLINICAL DATA:  Shortness of breath. Productive cough. History of known lung cancer. Prior left upper lobectomy. EXAM: PORTABLE CHEST 1 VIEW COMPARISON:  X-ray 03/26/2023 FINDINGS: Once again surgical changes from left-sided upper lobectomy with volume loss, retraction of the hilum and apical pleural thickening. Chronic lung  changes elsewhere. No pneumothorax, edema. Normal cardiopericardial silhouette. Overlapping cardiac leads. IMPRESSION: Stable surgical changes from left lobectomy with volume loss, scarring and pleural thickening. Chronic underlying lung changes elsewhere.  No new consolidation. Electronically Signed   By: Karen Kays M.D.   On: 04/11/2023 17:41   DG Chest Portable 1 View  Result Date: 03/26/2023 CLINICAL DATA:  Shortness of breath. EXAM: PORTABLE CHEST 1 VIEW COMPARISON:  03/06/2023 FINDINGS: Stable volume loss with scarring in the left apex. Lungs are hyperexpanded. The lungs are clear without focal pneumonia, edema, pneumothorax or pleural effusion. The cardiopericardial silhouette is within normal limits for size. No acute bony abnormality. Telemetry leads overlie the chest. IMPRESSION: Hyperexpansion without acute cardiopulmonary findings. 11 mm spiculated nodule identified in the left lung base on CT chest 03/08/2023 is not evident by x-ray today. Electronically Signed   By: Kennith Center M.D.   On: 03/26/2023 06:20     Time coordinating discharge: Over 30 minutes    [name], MD  [facility] 04/13/2023, 2:35 PM

## 2023-04-13 NOTE — Evaluation (Signed)
Physical Therapy Evaluation Patient Details Name: Tracy Park MRN: [id number] DOB: 1956/08/09 Today's Date: 04/13/2023  History of Present Illness  67 y.o. female PMH:CAD, bipolar disorder, anxiety, PTSD, COPD on home oxygen at 2 L, lung cancer, sleep apnea, nephrolithiasis, hydroureteronephrosis, hypothyroidism, presented to ED  progressively worsening shortness of breath and wheezing.    Of note she was recently hospitalized for COPD exacerbation; pt also with recent L foot injury secondary to O2 cylinder falling on foot. xrays negative  Clinical Impression  Patient evaluated by Physical Therapy with no further acute PT needs identified. All education has been completed and the patient has no further questions.  Pt is eating lunch, therefore limited amb/activity  so pt could return to eating;   pt amb short distance placing wt on L heel d/t pain in forefoot;  demonstrated and reviewed use of RW to offload foot and pt declines RW;  reviewed use of cane in R hand to offload and pt is agreeable to use of hurrycane. SPO2=90% Contacted ortho tech for [employer] pt educated on use, wearing shoe on R foot to even leg lengths once boot is placed;  educated on use of cane in R hand and pt verbalizes understanding/agrees;   Pt should be ready for d/c once camboot is placed by ortho tech   See below for any follow-up Physical Therapy or equipment needs. PT is signing off. Thank you for this referral.         If plan is discharge home, recommend the following: Help with stairs or ramp for entrance   Can travel by private vehicle        Equipment Recommendations None recommended by PT  Recommendations for Other Services       Functional Status Assessment Patient has had a recent decline in their functional status and demonstrates the ability to make significant improvements in function in a reasonable and predictable amount of time.     Precautions / Restrictions Precautions Precautions:  Fall Precaution Comments: O2 dependent at baseline Required Braces or Orthoses: Other Brace Other Brace: ortho tech contacted for camboot for LLE; pt educated on use, wearing shoe on R foot to even leg lengths once boot is placed;  educated on use of cane in R hand and pt verbalizes understanding/agrees Restrictions Weight Bearing Restrictions: No Other Position/Activity Restrictions: WBAT LLE      Mobility  Bed Mobility Overal bed mobility: Modified Independent                  Transfers Overall transfer level: Modified independent Equipment used: None                    Ambulation/Gait Ambulation/Gait assistance: Supervision Gait Distance (Feet): 10 Feet Assistive device: None Gait Pattern/deviations: Step-to pattern       General Gait Details: pt placing wt on L heel d/t pain in foot; demonstrated and reviewed use of RW to offload foot and pt declines RW; reviewed use of cane in R hand to offload and pt is agreeable to use of hurrycane. SPO2=90%  Stairs            Wheelchair Mobility     Tilt Bed    Modified Rankin (Stroke Patients Only)       Balance Overall balance assessment: Mild deficits observed, not formally tested  Pertinent Vitals/Pain Pain Assessment Pain Assessment: 0-10 Pain Location: L foot Pain Descriptors / Indicators: Discomfort, Sore Pain Intervention(s): Limited activity within patient's tolerance, Monitored during session, Repositioned    Home Living Family/patient expects to be discharged to:: Private residence Living Arrangements: Children Available Help at Discharge: Family;Available 24 hours/day Type of Home: House Home Access: Level entry       Home Layout: One level Home Equipment: Agricultural consultant (2 wheels);Cane - single point Additional Comments: On 2 L O2 at home 24/7    Prior Function Prior Level of Function : Independent/Modified  Independent             Mobility Comments: does not normally amb with device       Hand Dominance        Extremity/Trunk Assessment   Upper Extremity Assessment Upper Extremity Assessment: Overall WFL for tasks assessed    Lower Extremity Assessment Lower Extremity Assessment: LLE deficits/detail LLE Deficits / Details: ankle AROM WFL; strength NT d/t pain in forefoot/hematoma; knee/hip grossly WFL       Communication   Communication: No difficulties  Cognition Arousal/Alertness: Awake/alert Behavior During Therapy: WFL for tasks assessed/performed Overall Cognitive Status: Within Functional Limits for tasks assessed                                          General Comments      Exercises     Assessment/Plan    PT Assessment Patient does not need any further PT services  PT Problem List         PT Treatment Interventions      PT Goals (Current goals can be found in the Care Plan section)  Acute Rehab PT Goals PT Goal Formulation: All assessment and education complete, DC therapy    Frequency       Co-evaluation               AM-PAC PT "6 Clicks" Mobility  Outcome Measure Help needed turning from your back to your side while in a flat bed without using bedrails?: None Help needed moving from lying on your back to sitting on the side of a flat bed without using bedrails?: None Help needed moving to and from a bed to a chair (including a wheelchair)?: None Help needed standing up from a chair using your arms (e.g., wheelchair or bedside chair)?: None Help needed to walk in hospital room?: None Help needed climbing 3-5 steps with a railing? : A Little 6 Click Score: 23    End of Session   Activity Tolerance: Patient tolerated treatment well;Patient limited by pain Patient left: in bed;with call bell/phone within reach Nurse Communication: Mobility status PT Visit Diagnosis: Unsteadiness on feet (R26.81)    Time:  1610-9604 PT Time Calculation (min) (ACUTE ONLY): 24 min   Charges:   PT Evaluation $PT Eval Low Complexity: 1 Low   PT General Charges $$ ACUTE PT VISIT: 1 Visit         , PT  Acute Rehab Dept [facility]) [phone]  04/13/2023   [facility] 04/13/2023, 2:47 PM

## 2023-04-13 NOTE — TOC Initial Note (Addendum)
Transition of Care [facility]) - Initial/Assessment Note    Patient Details  Name: Tracy Park MRN: [id number] Date of Birth: 08/04/1956  Transition of [facility]) CM/SW Contact:    Adrian Prows, RN Phone Number: 04/13/2023, 6:17 PM  Clinical Narrative:                 [facility] consult for d/c planning; spoke w/ pt in room; she has home oxygen w/ [employer] and her travel tank is empty; pt also says her address has changed: [address] [place] [address]; called [employer] after hours line and spoke w/ Hillery Jacks, Rep; travel tank will be delivered to room; pt also says she does not have transportation; Therapist, nutritional and Release of Liability signed by pt w/ Lillia Dallas, RN; taxi voucher given; Lillia Dallas will call for [facility] when pt ready for d/c; no TOC needs.  Expected Discharge Plan: Home/Self Care Barriers to Discharge: No Barriers Identified   Patient Goals and CMS Choice Patient states their goals for this hospitalization and ongoing recovery are:: home          Expected Discharge Plan and Services   Discharge Planning Services: CM Consult Post Acute Care Choice: Resumption of Svcs/PTA Provider Living arrangements for the past 2 months: Single Family Home Expected Discharge Date: 04/13/23               DME Arranged: Oxygen DME Agency: [employer] Date DME Agency Contacted: 04/13/23 Time DME Agency Contacted: [phone] Representative spoke with at DME Agency: Hillery Jacks            Prior Living Arrangements/Services Living arrangements for the past 2 months: Single Family Home Lives with:: Self Patient language and need for interpreter reviewed:: Yes Do you feel safe going back to the place where you live?: Yes      Need for Family Participation in Patient Care: Yes (Comment) Care giver support system in place?: Yes (comment) Current home services: DME (cane. home oxygen w/ [employer]) Criminal Activity/Legal Involvement Pertinent to Current  Situation/Hospitalization: No - Comment as needed  Activities of Daily Living Home Assistive Devices/Equipment: Eyeglasses, Oxygen, Dentures (specify type) (upper lower) ADL Screening (condition at time of admission) Patient's cognitive ability adequate to safely complete daily activities?: Yes Is the patient deaf or have difficulty hearing?: No Does the patient have difficulty seeing, even when wearing glasses/contacts?: No Does the patient have difficulty concentrating, remembering, or making decisions?: No Patient able to express need for assistance with ADLs?: Yes Does the patient have difficulty dressing or bathing?: No Independently performs ADLs?: Yes (appropriate for developmental age) Does the patient have difficulty walking or climbing stairs?: Yes Weakness of Legs: None Weakness of Arms/Hands: None  Permission Sought/Granted Permission sought to share information with : Case Manager Permission granted to share information with : Yes, Verbal Permission Granted  Share Information with NAME: Case Manager     Permission granted to share info w Relationship: Blima Rich (Daughter)  [phone]     Emotional Assessment Appearance:: Appears stated age Attitude/Demeanor/Rapport: Gracious Affect (typically observed): Accepting Orientation: : Oriented to Self, Oriented to Place, Oriented to  Time, Oriented to Situation Alcohol / Substance Use: Not Applicable Psych Involvement: No (comment)  Admission diagnosis:  COPD exacerbation (HCC) [J44.1] Patient Active Problem List   Diagnosis Date Noted   Left foot pain 04/12/2023   Acute exacerbation of chronic obstructive pulmonary disease (COPD) (HCC) 03/06/2023   Hypocalcemia 03/06/2023   GAD (generalized anxiety disorder) 03/06/2023  Anemia of chronic disease 03/06/2023   Paroxysmal atrial fibrillation (HCC) 02/23/2023   Atrial fibrillation with RVR (HCC) 01/25/2023   Pulmonary nodule 01/25/2023   COPD (chronic obstructive  pulmonary disease) (HCC) 01/25/2023   Essential hypertension 01/25/2023   Thrombocytosis 03/26/2022   COPD exacerbation (HCC) 03/25/2022   DDD (degenerative disc disease), cervical 03/24/2022   Bipolar 2 disorder (HCC) 03/24/2022   Septic shock (HCC) 03/14/2022   Sepsis due to pneumonia (HCC) 03/13/2022   Dyslipidemia 03/13/2022   Anxiety and depression 03/13/2022   History of lung cancer 03/13/2022   Tobacco abuse 03/13/2022   Pneumonia 02/27/2022   Adenocarcinoma of lung (HCC) 02/26/2022   Leukocytosis 02/26/2022   COPD with acute exacerbation (HCC) 02/26/2022   Chronic hypoxic respiratory failure (HCC) 02/26/2022   Normocytic anemia 02/08/2022   Protein-calorie malnutrition, severe 08/25/2021   Influenza A with pneumonia 08/16/2021   Sepsis (HCC) 08/16/2021   Hyponatremia 08/16/2021   Hypokalemia 08/16/2021   Hypomagnesemia 08/16/2021   Hydronephrosis 05/03/2021   Acute encephalopathy 05/02/2021   Acute on chronic respiratory failure with hypoxia (HCC) 04/10/2020   CAP (community acquired pneumonia) 04/10/2020   Coronary artery disease involving native coronary artery of native heart without angina pectoris 08/18/2018   Malignant neoplasm of upper lobe of left lung (HCC) 11/12/2016   Chronic bilateral low back pain without sciatica 08/21/2016   Obstructive sleep apnea (adult) (pediatric) 02/22/2015   Hyperlipidemia 06/03/2013   Vitamin D deficiency 06/03/2013   GERD (gastroesophageal reflux disease) 04/28/2012   Acquired hypothyroidism 04/28/2012   Nicotine abuse 04/28/2012   Depression 04/28/2012   Chest pain, atypical 04/28/2012   PCP:  Jacquelin Hawking, PA-C Pharmacy:   [facility] [phone] - [address], [place] - [address] [place] [address] Phone: [phone] Fax: [phone]  [place] [facility] [address] [place] [address] Phone: [phone] Fax: [phone]  [facility] #[id number] [place], [place] - [address] AT [facility] OF [address] [place] [address] Phone: [phone] Fax: [phone]  [facility] #[id number] [place], [place] - [address] AT [facility] OF [facility] & [address] [place] [address] Phone: [phone] Fax: [phone]     Social Determinants of Health (SDOH) Social History: SDOH Screenings   Food Insecurity: No Food Insecurity (04/13/2023)  Recent Concern: Food Insecurity - Food Insecurity Present (04/07/2023)   Received from [facility]  Housing: Low Risk  (04/13/2023)  Recent Concern: Housing - Medium Risk (04/12/2023)  Transportation Needs: No Transportation Needs (04/13/2023)  Recent Concern: Transportation Needs - Unmet Transportation Needs (04/12/2023)  Utilities: Not At Risk (04/13/2023)  Financial Resource Strain: Medium Risk (04/07/2023)   Received from [facility]  Physical Activity: Unknown (04/07/2023)   Received from [facility]  Social Connections: Moderately Integrated (04/07/2023)   [facility]  Stress: Stress Concern Present (04/07/2023)   Received from [facility]  Tobacco Use: High Risk (04/12/2023)   SDOH Interventions: Food Insecurity Interventions: Intervention Not Indicated, Inpatient TOC Housing Interventions: Intervention Not Indicated, Inpatient TOC Transportation Interventions:  Animator and Release of Liability signed by pt w/ Lillia Dallas, RN) Utilities Interventions: Intervention Not Indicated, Inpatient TOC   Readmission Risk Interventions    04/13/2023    6:12 PM 03/09/2023    4:52 PM 02/25/2023    4:01 PM  Readmission Risk Prevention Plan  Transportation Screening Complete Complete Complete  Medication Review Oceanographer) Complete Complete Complete  PCP or Specialist appointment within 3-5 days of discharge Complete Complete Complete  HRI or Home Care Consult Complete Complete Complete  SW Recovery  Care/Counseling Consult Complete Complete Complete  Palliative Care Screening Not Applicable Not Applicable Not Applicable  Skilled Nursing Facility Not Applicable Not Applicable Not Applicable

## 2023-04-16 ENCOUNTER — Encounter (HOSPITAL_COMMUNITY): Payer: Self-pay | Admitting: Emergency Medicine

## 2023-04-16 ENCOUNTER — Inpatient Hospital Stay (HOSPITAL_COMMUNITY)
Admission: EM | Admit: 2023-04-16 | Discharge: 2023-04-18 | DRG: 191 | Disposition: A | Discharge status: Home / Self Care | Payer: 59 | Attending: Internal Medicine | Admitting: Internal Medicine

## 2023-04-16 ENCOUNTER — Other Ambulatory Visit: Payer: Self-pay

## 2023-04-16 ENCOUNTER — Emergency Department (HOSPITAL_COMMUNITY): Payer: 59

## 2023-04-16 DIAGNOSIS — J441 Chronic obstructive pulmonary disease with (acute) exacerbation: Secondary | ICD-10-CM | POA: Diagnosis present

## 2023-04-16 DIAGNOSIS — I10 Essential (primary) hypertension: Secondary | ICD-10-CM | POA: Diagnosis present

## 2023-04-16 DIAGNOSIS — I480 Paroxysmal atrial fibrillation: Secondary | ICD-10-CM | POA: Diagnosis present

## 2023-04-16 DIAGNOSIS — I4891 Unspecified atrial fibrillation: Secondary | ICD-10-CM

## 2023-04-16 DIAGNOSIS — Z7951 Long term (current) use of inhaled steroids: Secondary | ICD-10-CM

## 2023-04-16 DIAGNOSIS — I2510 Atherosclerotic heart disease of native coronary artery without angina pectoris: Secondary | ICD-10-CM | POA: Diagnosis present

## 2023-04-16 DIAGNOSIS — Z66 Do not resuscitate: Secondary | ICD-10-CM | POA: Diagnosis present

## 2023-04-16 DIAGNOSIS — Z79890 Hormone replacement therapy (postmenopausal): Secondary | ICD-10-CM

## 2023-04-16 DIAGNOSIS — Z9981 Dependence on supplemental oxygen: Secondary | ICD-10-CM

## 2023-04-16 DIAGNOSIS — Z1152 Encounter for screening for COVID-19: Secondary | ICD-10-CM

## 2023-04-16 DIAGNOSIS — F4310 Post-traumatic stress disorder, unspecified: Secondary | ICD-10-CM | POA: Diagnosis present

## 2023-04-16 DIAGNOSIS — D72829 Elevated white blood cell count, unspecified: Secondary | ICD-10-CM | POA: Diagnosis present

## 2023-04-16 DIAGNOSIS — F319 Bipolar disorder, unspecified: Secondary | ICD-10-CM | POA: Diagnosis present

## 2023-04-16 DIAGNOSIS — Z885 Allergy status to narcotic agent status: Secondary | ICD-10-CM

## 2023-04-16 DIAGNOSIS — F17210 Nicotine dependence, cigarettes, uncomplicated: Secondary | ICD-10-CM | POA: Diagnosis present

## 2023-04-16 DIAGNOSIS — Z9221 Personal history of antineoplastic chemotherapy: Secondary | ICD-10-CM

## 2023-04-16 DIAGNOSIS — F419 Anxiety disorder, unspecified: Secondary | ICD-10-CM | POA: Diagnosis present

## 2023-04-16 DIAGNOSIS — I252 Old myocardial infarction: Secondary | ICD-10-CM | POA: Diagnosis not present

## 2023-04-16 DIAGNOSIS — J439 Emphysema, unspecified: Secondary | ICD-10-CM | POA: Diagnosis present

## 2023-04-16 DIAGNOSIS — K219 Gastro-esophageal reflux disease without esophagitis: Secondary | ICD-10-CM | POA: Diagnosis present

## 2023-04-16 DIAGNOSIS — E890 Postprocedural hypothyroidism: Secondary | ICD-10-CM | POA: Diagnosis present

## 2023-04-16 DIAGNOSIS — E785 Hyperlipidemia, unspecified: Secondary | ICD-10-CM | POA: Diagnosis present

## 2023-04-16 DIAGNOSIS — J449 Chronic obstructive pulmonary disease, unspecified: Secondary | ICD-10-CM

## 2023-04-16 DIAGNOSIS — Z902 Acquired absence of lung [part of]: Secondary | ICD-10-CM

## 2023-04-16 DIAGNOSIS — Z8249 Family history of ischemic heart disease and other diseases of the circulatory system: Secondary | ICD-10-CM

## 2023-04-16 DIAGNOSIS — E039 Hypothyroidism, unspecified: Secondary | ICD-10-CM | POA: Diagnosis present

## 2023-04-16 DIAGNOSIS — G4733 Obstructive sleep apnea (adult) (pediatric): Secondary | ICD-10-CM | POA: Diagnosis present

## 2023-04-16 DIAGNOSIS — J9611 Chronic respiratory failure with hypoxia: Secondary | ICD-10-CM | POA: Diagnosis present

## 2023-04-16 DIAGNOSIS — Z5982 Transportation insecurity: Secondary | ICD-10-CM

## 2023-04-16 DIAGNOSIS — Z87442 Personal history of urinary calculi: Secondary | ICD-10-CM

## 2023-04-16 DIAGNOSIS — Z7901 Long term (current) use of anticoagulants: Secondary | ICD-10-CM

## 2023-04-16 DIAGNOSIS — R7989 Other specified abnormal findings of blood chemistry: Secondary | ICD-10-CM

## 2023-04-16 DIAGNOSIS — Z9102 Food additives allergy status: Secondary | ICD-10-CM

## 2023-04-16 DIAGNOSIS — B965 Pseudomonas (aeruginosa) (mallei) (pseudomallei) as the cause of diseases classified elsewhere: Secondary | ICD-10-CM | POA: Diagnosis present

## 2023-04-16 DIAGNOSIS — J9621 Acute and chronic respiratory failure with hypoxia: Secondary | ICD-10-CM

## 2023-04-16 DIAGNOSIS — Z91018 Allergy to other foods: Secondary | ICD-10-CM

## 2023-04-16 DIAGNOSIS — Z85118 Personal history of other malignant neoplasm of bronchus and lung: Secondary | ICD-10-CM | POA: Diagnosis not present

## 2023-04-16 DIAGNOSIS — Z79899 Other long term (current) drug therapy: Secondary | ICD-10-CM | POA: Diagnosis not present

## 2023-04-16 DIAGNOSIS — Z886 Allergy status to analgesic agent status: Secondary | ICD-10-CM

## 2023-04-16 DIAGNOSIS — Z833 Family history of diabetes mellitus: Secondary | ICD-10-CM

## 2023-04-16 DIAGNOSIS — Z91048 Other nonmedicinal substance allergy status: Secondary | ICD-10-CM

## 2023-04-16 DIAGNOSIS — Z90710 Acquired absence of both cervix and uterus: Secondary | ICD-10-CM

## 2023-04-16 DIAGNOSIS — Z888 Allergy status to other drugs, medicaments and biological substances status: Secondary | ICD-10-CM

## 2023-04-16 LAB — BASIC METABOLIC PANEL
Anion gap: 8 (ref 5–15)
BUN: 15 mg/dL (ref 8–23)
CO2: 33 mmol/L — ABNORMAL HIGH (ref 22–32)
Calcium: 8.5 mg/dL — ABNORMAL LOW (ref 8.9–10.3)
Chloride: 99 mmol/L (ref 98–111)
Creatinine, Ser: 0.63 mg/dL (ref 0.44–1.00)
GFR, Estimated: >60 mL/min (ref >60)
Glucose, Bld: 96 mg/dL (ref 70–99)
Potassium: 3.7 mmol/L (ref 3.5–5.1)
Sodium: 140 mmol/L (ref 135–145)

## 2023-04-16 LAB — CBC WITH DIFFERENTIAL/PLATELET
Abs Immature Granulocytes: 0.04 10*3/uL (ref 0.00–0.07)
Basophils Absolute: 0 10*3/uL (ref 0.0–0.1)
Basophils Relative: 0 %
Eosinophils Absolute: 0.1 10*3/uL (ref 0.0–0.5)
Eosinophils Relative: 1 %
HCT: 40.7 % (ref 36.0–46.0)
Hemoglobin: 12.6 g/dL (ref 12.0–15.0)
Immature Granulocytes: 0 %
Lymphocytes Relative: 26 %
Lymphs Abs: 2.9 10*3/uL (ref 0.7–4.0)
MCH: 28.7 pg (ref 26.0–34.0)
MCHC: 31 g/dL (ref 30.0–36.0)
MCV: 92.7 fL (ref 80.0–100.0)
Monocytes Absolute: 0.8 10*3/uL (ref 0.1–1.0)
Monocytes Relative: 7 %
Neutro Abs: 7.4 10*3/uL (ref 1.7–7.7)
Neutrophils Relative %: 66 %
Platelets: 195 10*3/uL (ref 150–400)
RBC: 4.39 MIL/uL (ref 3.87–5.11)
RDW: 14.5 % (ref 11.5–15.5)
WBC: 11.2 10*3/uL — ABNORMAL HIGH (ref 4.0–10.5)
nRBC: 0 % (ref 0.0–0.2)

## 2023-04-16 LAB — TROPONIN I (HIGH SENSITIVITY)
Troponin I (High Sensitivity): 11 ng/L (ref <18)
Troponin I (High Sensitivity): 11 ng/L (ref <18)

## 2023-04-16 LAB — BRAIN NATRIURETIC PEPTIDE: B Natriuretic Peptide: 114.4 pg/mL — ABNORMAL HIGH (ref 0.0–100.0)

## 2023-04-16 LAB — D-DIMER, QUANTITATIVE: D-Dimer, Quant: 0.42 ug/mL-FEU (ref 0.00–0.50)

## 2023-04-16 MED ORDER — IPRATROPIUM-ALBUTEROL 0.5-2.5 (3) MG/3ML IN SOLN
3.0000 mL | Freq: Once | RESPIRATORY_TRACT | Status: AC
  Administered 2023-04-16: 3 mL via RESPIRATORY_TRACT
  Filled 2023-04-16: qty 3

## 2023-04-16 MED ORDER — MAGNESIUM SULFATE 2 GM/50ML IV SOLN
2.0000 g | Freq: Once | INTRAVENOUS | Status: AC
  Administered 2023-04-16: 2 g via INTRAVENOUS
  Filled 2023-04-16: qty 50

## 2023-04-16 MED ORDER — DILTIAZEM HCL-DEXTROSE 125-5 MG/125ML-% IV SOLN (PREMIX)
5.0000 mg/h | INTRAVENOUS | Status: DC
  Administered 2023-04-16: 5 mg/h via INTRAVENOUS
  Filled 2023-04-16: qty 125

## 2023-04-16 MED ORDER — DILTIAZEM LOAD VIA INFUSION
10.0000 mg | Freq: Once | INTRAVENOUS | Status: AC
  Administered 2023-04-16: 10 mg via INTRAVENOUS
  Filled 2023-04-16: qty 10

## 2023-04-16 MED ORDER — METHYLPREDNISOLONE SODIUM SUCC 125 MG IJ SOLR
125.0000 mg | Freq: Once | INTRAMUSCULAR | Status: AC
  Administered 2023-04-16: 125 mg via INTRAVENOUS
  Filled 2023-04-16: qty 2

## 2023-04-16 NOTE — ED Triage Notes (Signed)
Pt BIB [facility] with reports of SHOB and wheezing. PT received "3 breathing tx at home" PTA. PT also received 5mg  albuterol 0.5mg  atrovent en route with EMS.

## 2023-04-16 NOTE — ED Provider Notes (Signed)
[place] EMERGENCY DEPARTMENT AT [facility] Provider Note   CSN: [id number] Arrival date & time: 04/16/23  1801    History  Chief Complaint  Patient presents with   Shortness of Breath    Tracy Park is a 67 y.o. female history of A-fib on Eliquis, COPD chronic hypoxic respiratory failure on 2 L, sleep apnea, prior lung cancer s/p lobectomy, tobacco use here for evaluation of shortness of breath.  Doing nebulizers at home without relief.  She has a chronic cough.  She is in for nebulizers today.  Has some intermittent palpitations and heaviness to her chest.  Has some generalized weakness.  No fever, back pain, abdominal pain, nausea, vomiting, pain or swelling to lower legs.  Does states she thinks she possibly might have missed some medications this week including her anticoagulation.  Cannot lay flat chronically since her prior lobectomy.  She denies PE, DVT  Of note patient is supposed to be on steroids from her prior discharge however she has not picked up the prescription due to lack of transportation  HPI     Home Medications Prior to Admission medications   Medication Sig Start Date End Date Taking? Authorizing Provider  acetaminophen (TYLENOL) 500 MG tablet Take 500 mg by mouth every 6 (six) hours as needed for moderate pain.   Yes [provider]  albuterol (PROVENTIL) (2.5 MG/3ML) 0.083% nebulizer solution Take 3 mLs (2.5 mg total) by nebulization every 6 (six) hours as needed for wheezing or shortness of breath. Patient taking differently: Take 2.5 mg by nebulization See admin instructions. Nebulize 2.5 mg and inhale into the lungs in the morning and at bedtime 01/30/23  Yes Tilden Fossa, MD  albuterol (VENTOLIN HFA) 108 (90 Base) MCG/ACT inhaler Inhale 2 puffs into the lungs every 6 (six) hours as needed for wheezing or shortness of breath. 01/13/23  Yes Burnadette Pop, MD  ALPRAZolam Prudy Feeler) 0.5 MG tablet Take 0.5 mg by mouth 2 (two) times daily  as needed for anxiety. 03/30/23  Yes [provider]  apixaban (ELIQUIS) 5 MG TABS tablet Take 5 mg by mouth 2 (two) times daily.   Yes [provider]  Budeson-Glycopyrrol-Formoterol (BREZTRI AEROSPHERE) 160-9-4.8 MCG/ACT AERO Inhale 1 puff into the lungs in the morning and at bedtime.   Yes [provider]  levothyroxine (SYNTHROID) 125 MCG tablet Take 125 mcg by mouth daily before breakfast.   Yes [provider]  losartan (COZAAR) 25 MG tablet Take 12.5 mg by mouth daily.   Yes [provider]  Magnesium Oxide 400 MG CAPS Take 1 capsule (400 mg total) by mouth daily at 12 noon. Patient taking differently: Take 400 mg by mouth in the morning. 03/01/22  Yes Albertine Grates, MD  metoprolol succinate (TOPROL-XL) 25 MG 24 hr tablet Take 12.5 mg by mouth daily.   Yes [provider]  nitroGLYCERIN (NITROSTAT) 0.4 MG SL tablet Place 0.4 mg under the tongue every 5 (five) minutes as needed for chest pain.   Yes [provider]  OXYGEN Inhale 2 L/min into the lungs continuous.   Yes [provider]  pantoprazole (PROTONIX) 40 MG tablet Take 40 mg by mouth daily before breakfast.   Yes [provider]  rosuvastatin (CRESTOR) 20 MG tablet Take 20 mg by mouth daily.   Yes [provider]  SYSTANE COMPLETE PF 0.6 % SOLN Place 1 drop into both eyes 3 (three) times daily as needed (for dryness).   Yes [provider]  tamsulosin (FLOMAX) 0.4 MG CAPS capsule Take 0.4 mg by mouth daily.   Yes [provider]  predniSONE (DELTASONE) 20 MG tablet Take 2 tablets (40 mg total) by mouth daily with breakfast for 5 days. 04/13/23 04/18/23  [name], MD      Allergies    Red dye, Oxycontin [oxycodone hcl], Prednisone, Aspirin, Strawberry extract, Tomato, Tape, and Wound dressing adhesive    Review of Systems   Review of Systems  Constitutional: Negative.   HENT: Negative.    Respiratory:  Positive for cough,  shortness of breath and wheezing.   Cardiovascular:  Positive for chest pain and palpitations.  Gastrointestinal: Negative.   Genitourinary: Negative.   Musculoskeletal: Negative.   Skin: Negative.   Neurological: Negative.   All other systems reviewed and are negative.   Physical Exam Updated Vital Signs BP 131/80   Pulse (!) 127   Temp 98.1 F (36.7 C) (Oral)   Resp (!) 28   SpO2 93%  Physical Exam Vitals and nursing note reviewed.  Constitutional:      General: She is not in acute distress.    Appearance: She is well-developed. She is ill-appearing. She is not toxic-appearing or diaphoretic.     Comments: Chronically ill-appearing  HENT:     Head: Atraumatic.  Eyes:     Pupils: Pupils are equal, round, and reactive to light.  Cardiovascular:     Rate and Rhythm: Tachycardia present.     Pulses: Normal pulses.          Radial pulses are 2+ on the right side and 2+ on the left side.       Dorsalis pedis pulses are 2+ on the right side and 2+ on the left side.     Heart sounds: Normal heart sounds.  Pulmonary:     Effort: No respiratory distress.     Breath sounds: Wheezing present.     Comments: Inspiratory and expiratory wheeze Abdominal:     General: Bowel sounds are normal. There is no distension.     Palpations: Abdomen is soft.  Musculoskeletal:        General: Normal range of motion.     Cervical back: Normal range of motion.     Right lower leg: No tenderness. No edema.     Left lower leg: No tenderness. No edema.     Comments: No bony tenderness, compartments soft  Skin:    General: Skin is warm and dry.  Neurological:     General: No focal deficit present.     Mental Status: She is alert.  Psychiatric:        Mood and Affect: Mood normal.    ED Results / Procedures / Treatments   Labs (all labs ordered are listed, but only abnormal results are displayed) Labs Reviewed  CBC WITH DIFFERENTIAL/PLATELET - Abnormal; Notable for the following  components:      Result Value   WBC 11.2 (*)    All other components within normal limits  BASIC METABOLIC PANEL - Abnormal; Notable for the following components:   CO2 33 (*)    Calcium 8.5 (*)    All other components within normal limits  BRAIN NATRIURETIC PEPTIDE - Abnormal; Notable for the following components:   B Natriuretic Peptide 114.4 (*)    All other components within normal limits  D-DIMER, QUANTITATIVE  TROPONIN I (HIGH SENSITIVITY)  TROPONIN I (HIGH SENSITIVITY)    EKG EKG Interpretation Date/Time:  Wednesday April 16 2023 22:28:10 EDT Ventricular Rate:  132 PR Interval:  117 QRS Duration:  125 QT Interval:  319 QTC Calculation: 473 R Axis:   119  Text Interpretation: Atrial fibrillation RBBB and LPFB agree, now in afib compared to first previous same day Confirmed by Arby Barrette [phone]) on 04/16/2023 11:12:47 PM  Radiology DG Chest 2 View  Result Date: 04/16/2023 CLINICAL DATA:  Shortness of breath and wheezing. EXAM: CHEST - 2 VIEW COMPARISON:  04/11/2023, multiple priors.  Most recent CT 03/08/2023 FINDINGS: Emphysema with chronic hyperinflation. Bronchial thickening is chronic but increased. Chronic left lung volume loss with pleuroparenchymal opacity at the apex. The left lower lobe nodule on prior CT is not well-defined by radiograph. No acute airspace disease, pneumothorax or significant pleural effusion. Stable heart size and mediastinal contours. Postsurgical change at the left hilum. No acute osseous abnormalities are seen. IMPRESSION: 1. Emphysema with chronic hyperinflation. Bronchial thickening is chronic but increased, possible COPD exacerbation. 2. Chronic postsurgical left lung volume loss with pleuroparenchymal opacity at the apex. The left lower lobe nodule on prior CT is not well-defined by radiograph. Electronically Signed   By: Narda Rutherford M.D.   On: 04/16/2023 18:42    Procedures .Critical Care  Performed by: Linwood Dibbles,  PA-C Authorized by: Linwood Dibbles, PA-C   Critical care provider statement:    Critical care time (minutes):  36   Critical care was necessary to treat or prevent imminent or life-threatening deterioration of the following conditions:  Respiratory failure and circulatory failure   Critical care was time spent personally by me on the following activities:  Development of treatment plan with patient or surrogate, discussions with consultants, evaluation of patient's response to treatment, examination of patient, ordering and review of laboratory studies, ordering and review of radiographic studies, ordering and performing treatments and interventions, pulse oximetry, re-evaluation of patient's condition and review of old charts     Medications Ordered in ED Medications  diltiazem (CARDIZEM) 1 mg/mL load via infusion 10 mg (10 mg Intravenous Bolus from Bag 04/16/23 2241)    And  diltiazem (CARDIZEM) 125 mg in dextrose 5% 125 mL (1 mg/mL) infusion (7.5 mg/hr Intravenous Infusion Verify 04/16/23 2313)  ipratropium-albuterol (DUONEB) 0.5-2.5 (3) MG/3ML nebulizer solution 3 mL (3 mLs Nebulization Given 04/16/23 2103)  methylPREDNISolone sodium succinate (SOLU-MEDROL) 125 mg/2 mL injection 125 mg (125 mg Intravenous Given 04/16/23 2103)  magnesium sulfate IVPB 2 g 50 mL (0 g Intravenous Stopped 04/16/23 2207)  ipratropium-albuterol (DUONEB) 0.5-2.5 (3) MG/3ML nebulizer solution 3 mL (3 mLs Nebulization Given 04/16/23 2236)   ED Course/ Medical Decision Making/ A&P    66 year old chronically ill patient multiple medical problems here for evaluation of shortness of breath, palpitations and intermittent chest tightness.  Doing multiple breathing treatments at home. She has chronic hypoxic respiratory failure and still smokes tobacco.  Has had 4 nebs prior to arrival.  She has had multiple recent admissions for COPD exacerbation.  Has a chronic cough.  No fever.  She does not appear grossly fluid overloaded.  On  arrival she is on her home oxygen however has diffuse inspiratory and expiratory wheeze.  Will plan on nebulizers, magnesium, steroids, labs and imaging and reassess  Labs and imaging personally reviewed and interpreted:  CBC leukocytosis 11.2 Metabolic panel no significant abnormality BNP 114 D-dimer 0.42 Troponin 11 Chest x-ray with prior lobectomy, bronchial thickening chronic however increased possibly due to COPD exacerbation EKG without ischemic changes  Patient reassessed.  Found to be  in A-fib with RVR.  History of similar.  Took her medications this morning.  We discussed cardioversion given her anticoagulation use however she states she possibly missed some doses and she is unsure and does not want cardioversion.  Will start on Cardizem load  and infusion.  She will also need recurrent breathing treatments for her COPD, thankfully no new hypoxia here.  Will admit for further workup and management  Discussed with Dr. Cyndia Bent who agrees to evaluate patient for admission  The patient appears reasonably stabilized for admission considering the current resources, flow, and capabilities available in the ED at this time, and I doubt any other [facility] requiring further screening and/or treatment in the ED prior to admission.                                  Medical Decision Making Amount and/or Complexity of Data Reviewed Independent Historian: EMS External Data Reviewed: labs, radiology, ECG and notes. Labs: ordered. Decision-making details documented in ED Course. Radiology: ordered and independent interpretation performed. Decision-making details documented in ED Course. ECG/medicine tests: ordered and independent interpretation performed. Decision-making details documented in ED Course.  Risk OTC drugs. Prescription drug management. Parenteral controlled substances. Decision regarding hospitalization. Diagnosis or treatment significantly limited by social determinants of  health.          Final Clinical Impression(s) / ED Diagnoses Final diagnoses:  COPD exacerbation (HCC)  Atrial fibrillation with RVR (HCC)  Acute on chronic respiratory failure with hypoxia (HCC)  Elevated brain natriuretic peptide (BNP) level    Rx / DC Orders ED Discharge Orders     None         ,  A, PA-C 04/16/23 2321    Arby Barrette, MD 04/22/23 1457

## 2023-04-16 NOTE — ED Notes (Signed)
ED TO INPATIENT HANDOFF REPORT  ED Nurse Name and Phone #: Gustavus Messing RN   S Name/Age/Gender Tracy Park 67 y.o. female Room/Bed: WA22/WA22  Code Status   Code Status: Prior  Home/SNF/Other Home Patient oriented to: self, place, time, and situation Is this baseline? Yes   Triage Complete: Triage complete  Chief Complaint COPD exacerbation (HCC) [J44.1]  Triage Note Pt BIB [facility] with reports of SHOB and wheezing. PT received "3 breathing tx at home" PTA. PT also received 5mg  albuterol 0.5mg  atrovent en route with EMS.    Allergies Allergies  Allergen Reactions   Red Dye Hives, Itching and Other (See Comments)    Red food dye   Oxycontin [Oxycodone Hcl] Other (See Comments)    Hallucinations    Prednisone Hives and Nausea And Vomiting   Aspirin Hives   Strawberry Extract Hives and Itching   Tomato Hives and Itching   Tape Rash and Other (See Comments)    Prefers paper tape   Wound Dressing Adhesive Rash    Level of Care/Admitting Diagnosis ED Disposition     ED Disposition  Admit   Condition  --   Comment  Hospital Area: [facility] [[id number]]  Level of Care: Stepdown [14]  Admit to SDU based on following criteria: Cardiac Instability:  Patients experiencing chest pain, unconfirmed MI and stable, arrhythmias and CHF requiring medical management and potentially compromising patient's stability  May admit patient to [facility] or [facility] if equivalent level of care is available:: No  Covid Evaluation: Asymptomatic - no recent exposure (last 10 days) testing not required  Diagnosis: COPD exacerbation [facility]) [[id number]]  Admitting Physician: Anselm Jungling [[id number]]  Attending Physician: Anselm Jungling [id number]  Certification:: I certify this patient will need inpatient services for at least 2 midnights  Estimated Length of Stay: 2          B Medical/Surgery History Past Medical History:  Diagnosis Date   Anginal pain (HCC)    Anxiety     Bipolar disorder (HCC)    Cancer (HCC)    COPD (chronic obstructive pulmonary disease) (HCC)    Dyspnea    Family history of adverse reaction to anesthesia    History of kidney stones    Hydroureteronephrosis 08/16/2021   Hypothyroidism    Lung cancer (HCC)    Myocardial infarction (HCC)    Paroxysmal atrial fibrillation (HCC)    PTSD (post-traumatic stress disorder)    Sleep apnea    Thyroid disease    Past Surgical History:  Procedure Laterality Date   ABDOMINAL HYSTERECTOMY     BACK SURGERY     CYSTOSCOPY W/ URETERAL STENT PLACEMENT Right 05/03/2021   Procedure: CYSTOSCOPY WITH RETROGRADE PYELOGRAM/URETERAL STENT PLACEMENT;  Surgeon: Crist Fat, MD;  Location: [facility];  Service: Urology;  Laterality: Right;   EYE SURGERY     kidney stent     thyroidectomy       A IV Location/Drains/Wounds Patient Lines/Drains/Airways Status     Active Line/Drains/Airways     Name Placement date Placement time Site Days   Peripheral IV 04/16/23 20 G Anterior;Left Forearm 04/16/23  2101  Forearm  less than 1            Intake/Output Last 24 hours  Intake/Output Summary (Last 24 hours) at 04/16/2023 2314 Last data filed at 04/16/2023 2313 Gross per 24 hour  Intake 55.66 ml  Output --  Net 55.66 ml    Labs/Imaging Results for  orders placed or performed during the hospital encounter of 04/16/23 (from the past 48 hour(s))  Brain natriuretic peptide     Status: Abnormal   Collection Time: 04/16/23  8:48 PM  Result Value Ref Range   B Natriuretic Peptide 114.4 (H) 0.0 - 100.0 pg/mL    Comment: Performed at [facility], [address], [place], [place] [address]  CBC with Differential     Status: Abnormal   Collection Time: 04/16/23  8:49 PM  Result Value Ref Range   WBC 11.2 (H) 4.0 - 10.5 K/uL   RBC 4.39 3.87 - 5.11 MIL/uL   Hemoglobin 12.6 12.0 - 15.0 g/dL   HCT 32.4 40.1 - 02.7 %   MCV 92.7 80.0 - 100.0 fL   MCH 28.7 26.0 - 34.0 pg   MCHC 31.0  30.0 - 36.0 g/dL   RDW 25.3 66.4 - 40.3 %   Platelets 195 150 - 400 K/uL   nRBC 0.0 0.0 - 0.2 %   Neutrophils Relative % 66 %   Neutro Abs 7.4 1.7 - 7.7 K/uL   Lymphocytes Relative 26 %   Lymphs Abs 2.9 0.7 - 4.0 K/uL   Monocytes Relative 7 %   Monocytes Absolute 0.8 0.1 - 1.0 K/uL   Eosinophils Relative 1 %   Eosinophils Absolute 0.1 0.0 - 0.5 K/uL   Basophils Relative 0 %   Basophils Absolute 0.0 0.0 - 0.1 K/uL   Immature Granulocytes 0 %   Abs Immature Granulocytes 0.04 0.00 - 0.07 K/uL    Comment: Performed at [facility], [address], [place], [place] [address]  Basic metabolic panel     Status: Abnormal   Collection Time: 04/16/23  8:49 PM  Result Value Ref Range   Sodium 140 135 - 145 mmol/L   Potassium 3.7 3.5 - 5.1 mmol/L   Chloride 99 98 - 111 mmol/L   CO2 33 (H) 22 - 32 mmol/L   Glucose, Bld 96 70 - 99 mg/dL    Comment: Glucose reference range applies only to samples taken after fasting for at least 8 hours.   BUN 15 8 - 23 mg/dL   Creatinine, Ser 9.56 0.44 - 1.00 mg/dL   Calcium 8.5 (L) 8.9 - 10.3 mg/dL   GFR, Estimated >38 >75 mL/min    Comment: (NOTE) Calculated using the CKD-EPI Creatinine Equation (2021)    Anion gap 8 5 - 15    Comment: Performed at [facility], [address], [place], [place] [address]  Troponin I (High Sensitivity)     Status: None   Collection Time: 04/16/23  8:49 PM  Result Value Ref Range   Troponin I (High Sensitivity) 11 <18 ng/L    Comment: (NOTE) Elevated high sensitivity troponin I (hsTnI) values and significant  changes across serial measurements may suggest ACS but many other  chronic and acute conditions are known to elevate hsTnI results.  Refer to the "Links" section for chest pain algorithms and additional  guidance. Performed at [facility], [address] [place], [place] [address]   D-dimer, quantitative     Status: None   Collection Time: 04/16/23   8:49 PM  Result Value Ref Range   D-Dimer, Quant 0.42 0.00 - 0.50 ug/mL-FEU    Comment: (NOTE) At the manufacturer cut-off value of 0.5 g/mL FEU, this assay has a negative predictive value of 95-100%.This assay is intended for use in conjunction with a clinical pretest probability (PTP)  assessment model to exclude pulmonary embolism (PE) and deep venous thrombosis (DVT) in outpatients suspected of PE or DVT. Results should be correlated with clinical presentation. Performed at [facility], [address], [place], [place] [address]   Troponin I (High Sensitivity)     Status: None   Collection Time: 04/16/23 10:35 PM  Result Value Ref Range   Troponin I (High Sensitivity) 11 <18 ng/L    Comment: (NOTE) Elevated high sensitivity troponin I (hsTnI) values and significant  changes across serial measurements may suggest ACS but many other  chronic and acute conditions are known to elevate hsTnI results.  Refer to the "Links" section for chest pain algorithms and additional  guidance. Performed at [facility], [address], [place], [place] [address]    DG Chest 2 View  Result Date: 04/16/2023 CLINICAL DATA:  Shortness of breath and wheezing. EXAM: CHEST - 2 VIEW COMPARISON:  04/11/2023, multiple priors.  Most recent CT 03/08/2023 FINDINGS: Emphysema with chronic hyperinflation. Bronchial thickening is chronic but increased. Chronic left lung volume loss with pleuroparenchymal opacity at the apex. The left lower lobe nodule on prior CT is not well-defined by radiograph. No acute airspace disease, pneumothorax or significant pleural effusion. Stable heart size and mediastinal contours. Postsurgical change at the left hilum. No acute osseous abnormalities are seen. IMPRESSION: 1. Emphysema with chronic hyperinflation. Bronchial thickening is chronic but increased, possible COPD exacerbation. 2. Chronic postsurgical left lung volume loss with  pleuroparenchymal opacity at the apex. The left lower lobe nodule on prior CT is not well-defined by radiograph. Electronically Signed   By: Narda Rutherford M.D.   On: 04/16/2023 18:42    Pending Labs Unresulted Labs (From admission, onward)    None       Vitals/Pain Today's Vitals   04/16/23 2158 04/16/23 2200 04/16/23 2240 04/16/23 2300  BP:  139/86 133/81 131/80  Pulse:  (!) 59 (!) 102 (!) 127  Resp:  (!) 25 (!) 23 (!) 28  Temp: 98.1 F (36.7 C)     TempSrc: Oral     SpO2:  99% 97% 93%  PainSc:        Isolation Precautions No active isolations  Medications Medications  diltiazem (CARDIZEM) 1 mg/mL load via infusion 10 mg (10 mg Intravenous Bolus from Bag 04/16/23 2241)    And  diltiazem (CARDIZEM) 125 mg in dextrose 5% 125 mL (1 mg/mL) infusion (7.5 mg/hr Intravenous Infusion Verify 04/16/23 2313)  ipratropium-albuterol (DUONEB) 0.5-2.5 (3) MG/3ML nebulizer solution 3 mL (3 mLs Nebulization Given 04/16/23 2103)  methylPREDNISolone sodium succinate (SOLU-MEDROL) 125 mg/2 mL injection 125 mg (125 mg Intravenous Given 04/16/23 2103)  magnesium sulfate IVPB 2 g 50 mL (0 g Intravenous Stopped 04/16/23 2207)  ipratropium-albuterol (DUONEB) 0.5-2.5 (3) MG/3ML nebulizer solution 3 mL (3 mLs Nebulization Given 04/16/23 2236)    Mobility walks     Focused Assessments Respiratory and Cardiac.   R Recommendations: See Admitting Provider Note  Report given to:   Additional Notes: n/a

## 2023-04-17 DIAGNOSIS — I480 Paroxysmal atrial fibrillation: Secondary | ICD-10-CM

## 2023-04-17 DIAGNOSIS — E039 Hypothyroidism, unspecified: Secondary | ICD-10-CM | POA: Diagnosis not present

## 2023-04-17 DIAGNOSIS — Z85118 Personal history of other malignant neoplasm of bronchus and lung: Secondary | ICD-10-CM

## 2023-04-17 DIAGNOSIS — I10 Essential (primary) hypertension: Secondary | ICD-10-CM | POA: Diagnosis not present

## 2023-04-17 DIAGNOSIS — J441 Chronic obstructive pulmonary disease with (acute) exacerbation: Secondary | ICD-10-CM | POA: Diagnosis not present

## 2023-04-17 LAB — CBC
HCT: 42 % (ref 36.0–46.0)
Hemoglobin: 12.7 g/dL (ref 12.0–15.0)
MCH: 27.9 pg (ref 26.0–34.0)
MCHC: 30.2 g/dL (ref 30.0–36.0)
MCV: 92.3 fL (ref 80.0–100.0)
Platelets: 210 10*3/uL (ref 150–400)
RBC: 4.55 MIL/uL (ref 3.87–5.11)
RDW: 14.5 % (ref 11.5–15.5)
WBC: 9 10*3/uL (ref 4.0–10.5)
nRBC: 0 % (ref 0.0–0.2)

## 2023-04-17 LAB — MRSA NEXT GEN BY PCR, NASAL: MRSA by PCR Next Gen: NOT DETECTED

## 2023-04-17 LAB — SARS CORONAVIRUS 2 BY RT PCR: SARS Coronavirus 2 by RT PCR: NEGATIVE

## 2023-04-17 MED ORDER — ACETAMINOPHEN 500 MG PO TABS: 500.0000 mg | ORAL_TABLET | Freq: Four times a day (QID) | ORAL | Status: DC | PRN

## 2023-04-17 MED ORDER — POLYVINYL ALCOHOL 1.4 % OP SOLN: 1.0000 [drp] | Freq: Three times a day (TID) | OPHTHALMIC | Status: DC | PRN

## 2023-04-17 MED ORDER — ROSUVASTATIN CALCIUM 20 MG PO TABS
20.0000 mg | ORAL_TABLET | Freq: Every day | ORAL | Status: DC
  Administered 2023-04-17 – 2023-04-18 (×2): 20 mg via ORAL
  Filled 2023-04-17 (×2): qty 1

## 2023-04-17 MED ORDER — SODIUM CHLORIDE 0.9 % IV SOLN
2.0000 g | Freq: Two times a day (BID) | INTRAVENOUS | Status: DC
  Administered 2023-04-17 – 2023-04-18 (×3): 2 g via INTRAVENOUS
  Filled 2023-04-17 (×3): qty 12.5

## 2023-04-17 MED ORDER — APIXABAN 5 MG PO TABS
5.0000 mg | ORAL_TABLET | Freq: Two times a day (BID) | ORAL | Status: DC
  Administered 2023-04-17 – 2023-04-18 (×4): 5 mg via ORAL
  Filled 2023-04-17 (×4): qty 1

## 2023-04-17 MED ORDER — PANTOPRAZOLE SODIUM 40 MG PO TBEC
40.0000 mg | DELAYED_RELEASE_TABLET | Freq: Every day | ORAL | Status: DC
  Administered 2023-04-17 – 2023-04-18 (×2): 40 mg via ORAL
  Filled 2023-04-17 (×2): qty 1

## 2023-04-17 MED ORDER — LEVALBUTEROL HCL 0.63 MG/3ML IN NEBU
0.6300 mg | INHALATION_SOLUTION | Freq: Three times a day (TID) | RESPIRATORY_TRACT | Status: DC
  Administered 2023-04-17 – 2023-04-18 (×4): 0.63 mg via RESPIRATORY_TRACT
  Filled 2023-04-17 (×4): qty 3

## 2023-04-17 MED ORDER — DM-GUAIFENESIN ER 30-600 MG PO TB12
1.0000 | ORAL_TABLET | Freq: Two times a day (BID) | ORAL | Status: DC
  Administered 2023-04-17 – 2023-04-18 (×3): 1 via ORAL
  Filled 2023-04-17 (×3): qty 1

## 2023-04-17 MED ORDER — MOMETASONE FURO-FORMOTEROL FUM 100-5 MCG/ACT IN AERO
2.0000 | INHALATION_SPRAY | Freq: Two times a day (BID) | RESPIRATORY_TRACT | Status: DC
  Administered 2023-04-17 – 2023-04-18 (×3): 2 via RESPIRATORY_TRACT
  Filled 2023-04-17: qty 8.8

## 2023-04-17 MED ORDER — METOPROLOL SUCCINATE ER 25 MG PO TB24
12.5000 mg | ORAL_TABLET | Freq: Every day | ORAL | Status: DC
  Administered 2023-04-17 – 2023-04-18 (×2): 12.5 mg via ORAL
  Filled 2023-04-17 (×2): qty 1

## 2023-04-17 MED ORDER — LEVALBUTEROL HCL 0.63 MG/3ML IN NEBU: 0.6300 mg | INHALATION_SOLUTION | RESPIRATORY_TRACT | Status: DC | PRN

## 2023-04-17 MED ORDER — CHLORHEXIDINE GLUCONATE CLOTH 2 % EX PADS
6.0000 | MEDICATED_PAD | Freq: Every day | CUTANEOUS | Status: DC
  Administered 2023-04-17: 6 via TOPICAL

## 2023-04-17 MED ORDER — IPRATROPIUM BROMIDE 0.02 % IN SOLN: 0.5000 mg | RESPIRATORY_TRACT | Status: DC | PRN

## 2023-04-17 MED ORDER — LEVOTHYROXINE SODIUM 125 MCG PO TABS
125.0000 ug | ORAL_TABLET | Freq: Every day | ORAL | Status: DC
  Administered 2023-04-17 – 2023-04-18 (×2): 125 ug via ORAL
  Filled 2023-04-17 (×2): qty 1

## 2023-04-17 MED ORDER — TAMSULOSIN HCL 0.4 MG PO CAPS
0.4000 mg | ORAL_CAPSULE | Freq: Every day | ORAL | Status: DC
  Administered 2023-04-17 – 2023-04-18 (×2): 0.4 mg via ORAL
  Filled 2023-04-17 (×2): qty 1

## 2023-04-17 MED ORDER — MAGNESIUM OXIDE -MG SUPPLEMENT 400 (240 MG) MG PO TABS
400.0000 mg | ORAL_TABLET | Freq: Every morning | ORAL | Status: DC
  Administered 2023-04-17 – 2023-04-18 (×2): 400 mg via ORAL
  Filled 2023-04-17 (×2): qty 1

## 2023-04-17 MED ORDER — LOSARTAN POTASSIUM 25 MG PO TABS
12.5000 mg | ORAL_TABLET | Freq: Every day | ORAL | Status: DC
  Administered 2023-04-17 – 2023-04-18 (×2): 12.5 mg via ORAL
  Filled 2023-04-17 (×2): qty 0.5

## 2023-04-17 MED ORDER — UMECLIDINIUM BROMIDE 62.5 MCG/ACT IN AEPB
1.0000 | INHALATION_SPRAY | Freq: Every day | RESPIRATORY_TRACT | Status: DC
  Administered 2023-04-17 – 2023-04-18 (×2): 1 via RESPIRATORY_TRACT
  Filled 2023-04-17: qty 7

## 2023-04-17 MED ORDER — ALPRAZOLAM 0.5 MG PO TABS
0.5000 mg | ORAL_TABLET | Freq: Two times a day (BID) | ORAL | Status: DC | PRN
  Administered 2023-04-17: 0.5 mg via ORAL
  Filled 2023-04-17: qty 1

## 2023-04-17 MED ORDER — BUDESON-GLYCOPYRROL-FORMOTEROL 160-9-4.8 MCG/ACT IN AERO: 1.0000 | INHALATION_SPRAY | Freq: Two times a day (BID) | RESPIRATORY_TRACT | Status: DC

## 2023-04-17 MED ORDER — METHYLPREDNISOLONE SODIUM SUCC 125 MG IJ SOLR
60.0000 mg | Freq: Every day | INTRAMUSCULAR | Status: DC
  Administered 2023-04-17 – 2023-04-18 (×2): 60 mg via INTRAVENOUS
  Filled 2023-04-17 (×2): qty 2

## 2023-04-17 NOTE — Assessment & Plan Note (Signed)
-  PRN ipratropium and Xopenex -Daily IV Solu-Medrol -Start IV cefepime with recent prior positive sputum culture for Pseudomonas -Patient continues to have ongoing tobacco use but has cut down significantly to several cigarettes per day daily from 2 packs

## 2023-04-17 NOTE — Plan of Care (Signed)

## 2023-04-17 NOTE — Assessment & Plan Note (Signed)
Continue levothyroxine 

## 2023-04-17 NOTE — Assessment & Plan Note (Signed)
-   S/p left upper lobectomy -Follows with [facility] pulmonology.  Had CT chest in June that showed new left lower lobe spiculated nodule.  She missed follow-up with pulmonology this week due to transportation issue but she is aware of the nodule and will reschedule appointment for follow-up.

## 2023-04-17 NOTE — H&P (Signed)
History and Physical    Patient: Tracy Park ZOX:[id number] DOB: May 23, 1956 DOA: 04/16/2023 DOS: the patient was seen and examined on 04/17/2023 PCP: Jacquelin Hawking, PA-C  Patient coming from: Home  Chief Complaint:  Chief Complaint  Patient presents with   Shortness of Breath   HPI: [name] is a 67 y.o. female with medical history significant of COPD with chronic hypoxemia on 2 L of oxygen supplementation, paroxysmal atrial fibrillation on anticoagulation, hypothyroidism, history of adenocarcinoma of the lung s/p left upper lobectomy, hypertension, and hyperlipidemia who presents with shortness of breath.  Pt has been seen either in ED or hospitalized monthly since April for COPD exacerbation.  Symptoms of dyspnea started acutely yesterday. No worsening cough. No fever. Normally takes 4 breathing treatments daily and is on home 2L O2. Continues to smoke although has cut down to a few cigarettes rather than 2 packs daily. Felt chest pain and palpitations. Reports compliance with all medication including her Eliquis. No nausea or vomiting. No sick contact.    In the ED, she was afebrile and hypertensive and atrial fibrillation with RVR with heart rate up to 120s.  Stable on home 2 L of O2.  Mild leukocytosis of 11.2.  No anemia.  No significant electrolyte abnormality on BMP. Chest x-ray with emphysema and chronic hyperinflation.  Increased bronchial thickening. She was given multiple DuoNebs, IV Solu-Medrol, IV magnesium and also started on diltiazem infusion.   Review of Systems: As mentioned in the history of present illness. All other systems reviewed and are negative. Past Medical History:  Diagnosis Date   Anginal pain (HCC)    Anxiety    Bipolar disorder (HCC)    Cancer (HCC)    COPD (chronic obstructive pulmonary disease) (HCC)    Dyspnea    Family history of adverse reaction to anesthesia    History of kidney stones    Hydroureteronephrosis 08/16/2021    Hypothyroidism    Lung cancer (HCC)    Myocardial infarction (HCC)    Paroxysmal atrial fibrillation (HCC)    PTSD (post-traumatic stress disorder)    Sleep apnea    Thyroid disease    Past Surgical History:  Procedure Laterality Date   ABDOMINAL HYSTERECTOMY     BACK SURGERY     CYSTOSCOPY W/ URETERAL STENT PLACEMENT Right 05/03/2021   Procedure: CYSTOSCOPY WITH RETROGRADE PYELOGRAM/URETERAL STENT PLACEMENT;  Surgeon: Crist Fat, MD;  Location: [facility];  Service: Urology;  Laterality: Right;   EYE SURGERY     kidney stent     thyroidectomy     Social History:  reports that she has been smoking cigarettes. She has never used smokeless tobacco. She reports that she does not currently use alcohol. She reports that she does not currently use drugs.  Allergies  Allergen Reactions   Red Dye Hives, Itching and Other (See Comments)    Red food dye   Oxycontin [Oxycodone Hcl] Other (See Comments)    Hallucinations    Prednisone Hives and Nausea And Vomiting   Aspirin Hives   Strawberry Extract Hives and Itching   Tomato Hives and Itching   Tape Rash and Other (See Comments)    Prefers paper tape   Wound Dressing Adhesive Rash    Family History  Problem Relation Age of Onset   Hypertension Mother    Heart failure Mother    Hypertension Father    Diabetes Father    Heart failure Father     Prior to Admission  medications   Medication Sig Start Date End Date Taking? Authorizing Provider  acetaminophen (TYLENOL) 500 MG tablet Take 500 mg by mouth every 6 (six) hours as needed for moderate pain.   Yes [provider]  albuterol (PROVENTIL) (2.5 MG/3ML) 0.083% nebulizer solution Take 3 mLs (2.5 mg total) by nebulization every 6 (six) hours as needed for wheezing or shortness of breath. Patient taking differently: Take 2.5 mg by nebulization See admin instructions. Nebulize 2.5 mg and inhale into the lungs in the morning and at bedtime 01/30/23  Yes Tilden Fossa, MD   albuterol (VENTOLIN HFA) 108 (90 Base) MCG/ACT inhaler Inhale 2 puffs into the lungs every 6 (six) hours as needed for wheezing or shortness of breath. 01/13/23  Yes Burnadette Pop, MD  ALPRAZolam Prudy Feeler) 0.5 MG tablet Take 0.5 mg by mouth 2 (two) times daily as needed for anxiety. 03/30/23  Yes [provider]  apixaban (ELIQUIS) 5 MG TABS tablet Take 5 mg by mouth 2 (two) times daily.   Yes [provider]  Budeson-Glycopyrrol-Formoterol (BREZTRI AEROSPHERE) 160-9-4.8 MCG/ACT AERO Inhale 1 puff into the lungs in the morning and at bedtime.   Yes [provider]  levothyroxine (SYNTHROID) 125 MCG tablet Take 125 mcg by mouth daily before breakfast.   Yes [provider]  losartan (COZAAR) 25 MG tablet Take 12.5 mg by mouth daily.   Yes [provider]  Magnesium Oxide 400 MG CAPS Take 1 capsule (400 mg total) by mouth daily at 12 noon. Patient taking differently: Take 400 mg by mouth in the morning. 03/01/22  Yes Albertine Grates, MD  metoprolol succinate (TOPROL-XL) 25 MG 24 hr tablet Take 12.5 mg by mouth daily.   Yes [provider]  nitroGLYCERIN (NITROSTAT) 0.4 MG SL tablet Place 0.4 mg under the tongue every 5 (five) minutes as needed for chest pain.   Yes [provider]  OXYGEN Inhale 2 L/min into the lungs continuous.   Yes [provider]  pantoprazole (PROTONIX) 40 MG tablet Take 40 mg by mouth daily before breakfast.   Yes [provider]  rosuvastatin (CRESTOR) 20 MG tablet Take 20 mg by mouth daily.   Yes [provider]  SYSTANE COMPLETE PF 0.6 % SOLN Place 1 drop into both eyes 3 (three) times daily as needed (for dryness).   Yes [provider]  tamsulosin (FLOMAX) 0.4 MG CAPS capsule Take 0.4 mg by mouth daily.   Yes [provider]  predniSONE (DELTASONE) 20 MG tablet Take 2 tablets (40 mg total) by mouth daily with breakfast for 5 days. 04/13/23 04/18/23  [name], MD     Physical Exam: Vitals:   04/16/23 2240 04/16/23 2300 04/16/23 2330 04/17/23 0000  BP: 133/81 131/80 112/89 110/68  Pulse: (!) 102 (!) 127 (!) 123 67  Resp: (!) 23 (!) 28 (!) 21 (!) 23  Temp:      TempSrc:      SpO2: 97% 93% 92% 91%   Constitutional: NAD, calm, comfortable, thin frail chronically ill-appearing elderly female laying upright in bed  eyes: PERRL, lids and conjunctivae normal ENMT: Mucous membranes are moist.  Neck: normal, supple Respiratory: Diminished breath sounds throughout with diffuse expiratory wheezes, no crackles.. Normal respiratory effort. No accessory muscle use.  Able to speak in full sentences on baseline 2 L of oxygen via nasal cannula. Cardiovascular: Regular rate and rhythm, no murmurs / rubs / gallops. No extremity edema.  Abdomen: no tenderness Musculoskeletal: no clubbing / cyanosis. No  joint deformity upper and lower extremities.  Muscle wasting in all extremities Skin: no rashes, lesions, ulcers. No induration Neurologic: CN 2-12 grossly intact.  Psychiatric: Normal judgment and insight. Alert and oriented x 3. Normal mood. Data Reviewed:  See HPI  Assessment and Plan: * COPD exacerbation (HCC) -PRN ipratropium and Xopenex -Daily IV Solu-Medrol -Start IV cefepime with recent prior positive sputum culture for Pseudomonas -Patient continues to have ongoing tobacco use but has cut down significantly to several cigarettes per day daily from 2 packs  History of lung cancer - S/p left upper lobectomy -Follows with [facility] pulmonology.  Had CT chest in June that showed new left lower lobe spiculated nodule.  She missed follow-up with pulmonology this week due to transportation issue but she is aware of the nodule and will reschedule appointment for follow-up.  Acquired hypothyroidism Continue levothyroxine  Essential hypertension - Currently on IV diltiazem - Continue metoprolol and losartan  Paroxysmal atrial fibrillation with RVR (HCC) -  Continue on diltiazem infusion to his heart rate goal of <100 -Continue home metoprolol - Continue Eliquis      Advance Care Planning:   Code Status: DNR   Consults: none  Family Communication: None at bedside  Severity of Illness: The appropriate patient status for this patient is INPATIENT. Inpatient status is judged to be reasonable and necessary in order to provide the required intensity of service to ensure the patient's safety. The patient's presenting symptoms, physical exam findings, and initial radiographic and laboratory data in the context of their chronic comorbidities is felt to place them at high risk for further clinical deterioration. Furthermore, it is not anticipated that the patient will be medically stable for discharge from the hospital within 2 midnights of admission.   * I certify that at the point of admission it is my clinical judgment that the patient will require inpatient hospital care spanning beyond 2 midnights from the point of admission due to high intensity of service, high risk for further deterioration and high frequency of surveillance required.*  Author: Anselm Jungling, DO 04/17/2023 2:01 AM  For on call review [email or web address].

## 2023-04-17 NOTE — Progress Notes (Signed)
PROGRESS NOTE  Tracy Park  DOB: [birth year]  PCP: Jacquelin Hawking, PA-C ZOX:[id number]  DOA: 04/16/2023  LOS: 1 day  Hospital Day: 2  Brief narrative: Tracy Park is a 67 y.o. female with PMH significant for obesity, HTN, HLD, CAD/MI, paroxysmal A-fib on anticoagulation, COPD on 2 L oxygen, adenocarcinoma of lung s/p left upper lobectomy, current daily smoker, hypothyroidism, bipolar disorder, anxiety, PTSD, nephrolithiasis  Patient has had multiple hospitalizations and ED visits in the last few months for COPD exacerbation. Most recently, hospitalized 8/2 to 8/4 for COPD flareup without pneumonia.  But it seems a sputum culture at that time showed few growth of pansensitive Pseudomonas. Continues to smoke 3 to 5 cigarettes a day.  8/7, patient presented to the ED from home after 72 hours of last hospital discharge with complaint of shortness of breath, wheezing. In the ED, patient was afebrile, heart rate in 90s, blood pressure 140s, at rest on 2 L oxygen  Labs with WBC count 11.2, hemoglobin normal, BMP unremarkable COVID PCR negative Chest x-ray showed chronic emphysema, increased bronchial thickening Patient was given IV Solu-Medrol, multiple DuoNebs, IV magnesium.  While in the ED, patient had a run of A-fib with RVR to 120s and was started on Cardizem drip. Admitted to [facility]   Subjective: Patient was seen and examined this afternoon.  Elderly Caucasian female.  Sitting up in bed.  On 2 L oxygen by nasal collateralized.  Has mild scattered wheezing and cough. Chart reviewed Afebrile, heart rate down to 50s this morning, blood pressure normal, tachypneic on 2 L oxygen Cardizem drip stopped at 5:50 this morning. Repeat labs this morning unremarkable  Assessment and plan: Acute exacerbation of COPD Recurrent hospitalization for COPD exacerbation Pseudomonas in the sputum Current everyday smoker Present with shortness of breath, wheezing Chest x-ray did not show  evidence of pneumonia but showed increased bronchial thickening Recently sputum culture from 8/4 showed growth of pansensitive Pseudomonas.  She was treated with antibiotics at that time.  With the current hospitalization, it is worth treating with antibiotics.   Currently on IV cefepime On IV Solu-Medrol 60 mg daily.  Continues to have diffuse scattered wheezing.  I will continue IV steroids for today. Counseled to quit smoking.  Nicotine patch offered Continue bronchodilators, Mucinex, incentive spirometry, encourage ambulation. Has not followed up with a pulmonologist at [facility] for a while.  Follow-up in 1 to 2 weeks Recent Labs  Lab 04/11/23 1813 04/12/23 0500 04/13/23 0414 04/16/23 2049 04/17/23 0309  WBC 11.4* 5.8 10.1 11.2* 9.0   H/o adenocarcinoma of lung 2018 S/p left upper lobectomy, chemo.  Oncologist at [facility] with RVR H/o paroxysmal A-fib PTA on Toprol 12.5 mg daily In the ED, patient was in RVR in the 120s and was started on Cardizem drip stopped after few hours Back on Toprol 12.5 mg daily. Continue long-term anticoagulation with Eliquis PAD  Essential hypertension Continue Toprol, losartan  CAD/MI HLD Continue metoprolol, Eliquis, statin  GERD PPI   Acquired hypothyroidism Continue levothyroxine    Mobility: Encourage ambulation  Goals of care   Code Status: DNR     DVT prophylaxis:   apixaban (ELIQUIS) tablet 5 mg   Antimicrobials: IV cefepime Fluid: None Consultants: None Family Communication: None at bedside  Status: Inpatient Level of care:  Stepdown   Patient is from: Home Needs to continue in-hospital care: Continues to have wheezing. Anticipated d/c to: Hopefully home tomorrow   Diet:  Diet Order  Diet regular Room service appropriate? Yes; Fluid consistency: Thin  Diet effective now                   Scheduled Meds:  apixaban  5 mg Oral BID   Chlorhexidine Gluconate Cloth  6 each Topical Daily    dextromethorphan-guaiFENesin  1 tablet Oral BID   levalbuterol  0.63 mg Nebulization TID   levothyroxine  125 mcg Oral Q0600   losartan  12.5 mg Oral Daily   magnesium oxide  400 mg Oral q AM   methylPREDNISolone (SOLU-MEDROL) injection  60 mg Intravenous Daily   metoprolol succinate  12.5 mg Oral Daily   mometasone-formoterol  2 puff Inhalation BID   pantoprazole  40 mg Oral QAC breakfast   rosuvastatin  20 mg Oral Daily   tamsulosin  0.4 mg Oral Daily   umeclidinium bromide  1 puff Inhalation Daily    PRN meds: acetaminophen, ALPRAZolam, ipratropium, levalbuterol, polyvinyl alcohol   Infusions:   ceFEPime (MAXIPIME) IV Stopped (04/17/23 0347)   diltiazem (CARDIZEM) infusion Stopped (04/17/23 0550)    Antimicrobials: Anti-infectives (From admission, onward)    Start     Dose/Rate Route Frequency Ordered Stop   04/17/23 0400  ceFEPIme (MAXIPIME) 2 g in sodium chloride 0.9 % 100 mL IVPB        2 g 200 mL/hr over 30 Minutes Intravenous Every 12 hours 04/17/23 0155 04/22/23 0359       Nutritional status:  Body mass index is 18.59 kg/m.          Objective: Vitals:   04/17/23 1319 04/17/23 1324  BP:    Pulse:    Resp:    Temp:    SpO2: 93% 100%    Intake/Output Summary (Last 24 hours) at 04/17/2023 1403 Last data filed at 04/17/2023 1000 Gross per 24 hour  Intake 424.64 ml  Output --  Net 424.64 ml   Filed Weights   04/17/23 0000  Weight: 47.6 kg   Weight change:  Body mass index is 18.59 kg/m.   Physical Exam: General exam: Pleasant, elderly Caucasian female.  Not in physical distress Skin: No rashes, lesions or ulcers. HEENT: Atraumatic, normocephalic, no obvious bleeding Lungs: Diffuse scattered wheezing on both lung fields CVS: Regular rate and rhythm, no murmur GI/Abd: soft, nontender, nondistended, bowel sound present CNS: Alert, awake, oriented x 3 Psychiatry: Sad affect Extremities: No pedal edema, no calf tenderness  Data Review: I have  personally reviewed the laboratory data and studies available.  F/u labs ordered Unresulted Labs (From admission, onward)     Start     Ordered   04/17/23 0607  MRSA Next Gen by PCR, Nasal  Once,   R        04/17/23 0607            Total time spent in review of labs and imaging, patient evaluation, formulation of plan, documentation and communication with family: 55 minutes  Signed, Lorin Glass, MD [facility] 04/17/2023

## 2023-04-17 NOTE — Assessment & Plan Note (Signed)
-   Currently on IV diltiazem - Continue metoprolol and losartan

## 2023-04-17 NOTE — TOC Initial Note (Addendum)
Transition of Care [facility]) - Initial/Assessment Note    Patient Details  Name: Tracy Park MRN: [id number] Date of Birth: [birth year]  [facility] CM/SW Contact:    Lavenia Atlas, RN Phone Number: 04/17/2023, 5:03 PM  Clinical Narrative:    Per chart review patient was recently discharged on 04/13/23 and readmitted on 04/16/23. Patient is currently in [facility] SDU for acute on chronic respiratory acidosis. This RNCM spoke with patient who reports she has home oxygen with [employer] and was to receive oxygen at home today. Patient reports currently she only has a concentrator at home and will need a portable tank at time of discharge. Patient reports she has medical transportation and will call upon discharge.  This RNCM spoke with Jermaine with [employer] to advise patient will need portable oxygen tank for discharge and additional oxygen supplies at home. Jermaine w/[employer] will follow for DME: oxygen.                 TOC will continue to follow for needs.     Expected Discharge Plan: Home/Self Care Barriers to Discharge: Continued Medical Work up   Patient Goals and CMS Choice Patient states their goals for this hospitalization and ongoing recovery are:: return home feeling better CMS Medicare.gov Compare Post Acute Care list provided to:: Patient Choice offered to / list presented to : Patient [place] ownership interest in [facility].provided to:: Patient    Expected Discharge Plan and Services In-house Referral: NA Discharge Planning Services: CM Consult Post Acute Care Choice: Resumption of Svcs/PTA Provider, Durable Medical Equipment (Home oxygen w/[employer]) Living arrangements for the past 2 months: Single Family Home                 DME Arranged: Oxygen DME Agency: [employer] Date DME Agency Contacted: 04/17/23 Time DME Agency Contacted: 1427 Representative spoke with at DME Agency: Vaughan Basta HH Arranged: NA HH Agency: NA        Prior Living  Arrangements/Services Living arrangements for the past 2 months: Single Family Home Lives with:: Friends Patient language and need for interpreter reviewed:: Yes Do you feel safe going back to the place where you live?: Yes      Need for Family Participation in Patient Care: No (Comment) Care giver support system in place?: Yes (comment) Current home services: DME (cane, home oxygen) Criminal Activity/Legal Involvement Pertinent to Current Situation/Hospitalization: No - Comment as needed  Activities of Daily Living Home Assistive Devices/Equipment: Oxygen, Cane (specify quad or straight) ADL Screening (condition at time of admission) Patient's cognitive ability adequate to safely complete daily activities?: Yes Is the patient deaf or have difficulty hearing?: No Does the patient have difficulty seeing, even when wearing glasses/contacts?: Yes (blind  in R eye) Does the patient have difficulty concentrating, remembering, or making decisions?: No Patient able to express need for assistance with ADLs?: Yes Does the patient have difficulty dressing or bathing?: No Independently performs ADLs?: Yes (appropriate for developmental age) Does the patient have difficulty walking or climbing stairs?: Yes Weakness of Legs: Right Weakness of Arms/Hands: None  Permission Sought/Granted Permission sought to share information with : Case Manager Permission granted to share information with : Yes, Verbal Permission Granted  Share Information with NAME: Case manager           Emotional Assessment Appearance:: Appears stated age Attitude/Demeanor/Rapport: Gracious Affect (typically observed): Accepting Orientation: : Oriented to Self, Oriented to Place, Oriented to Situation, Oriented to  Time Alcohol /  Substance Use: Tobacco Use (continues to smoke has decreased normal 2ppd) Psych Involvement: No (comment)  Admission diagnosis:  COPD exacerbation (HCC) [J44.1] Elevated brain natriuretic  peptide (BNP) level [R79.89] Atrial fibrillation with RVR (HCC) [I48.91] Acute on chronic respiratory failure with hypoxia (HCC) [J96.21] Patient Active Problem List   Diagnosis Date Noted   Left foot pain 04/12/2023   Acute exacerbation of chronic obstructive pulmonary disease (COPD) (HCC) 03/06/2023   Hypocalcemia 03/06/2023   GAD (generalized anxiety disorder) 03/06/2023   Anemia of chronic disease 03/06/2023   Paroxysmal atrial fibrillation (HCC) 02/23/2023   Paroxysmal atrial fibrillation with RVR (HCC) 01/25/2023   Pulmonary nodule 01/25/2023   COPD (chronic obstructive pulmonary disease) (HCC) 01/25/2023   Essential hypertension 01/25/2023   Thrombocytosis 03/26/2022   COPD exacerbation (HCC) 03/25/2022   DDD (degenerative disc disease), cervical 03/24/2022   Bipolar 2 disorder (HCC) 03/24/2022   Septic shock (HCC) 03/14/2022   Sepsis due to pneumonia (HCC) 03/13/2022   Dyslipidemia 03/13/2022   Anxiety and depression 03/13/2022   History of lung cancer 03/13/2022   Tobacco abuse 03/13/2022   Pneumonia 02/27/2022   Adenocarcinoma of lung (HCC) 02/26/2022   Leukocytosis 02/26/2022   COPD with acute exacerbation (HCC) 02/26/2022   Chronic hypoxic respiratory failure (HCC) 02/26/2022   Normocytic anemia 02/08/2022   Protein-calorie malnutrition, severe 08/25/2021   Influenza A with pneumonia 08/16/2021   Sepsis (HCC) 08/16/2021   Hyponatremia 08/16/2021   Hypokalemia 08/16/2021   Hypomagnesemia 08/16/2021   Hydronephrosis 05/03/2021   Acute encephalopathy 05/02/2021   Acute on chronic respiratory failure with hypoxia (HCC) 04/10/2020   CAP (community acquired pneumonia) 04/10/2020   Coronary artery disease involving native coronary artery of native heart without angina pectoris 08/18/2018   Malignant neoplasm of upper lobe of left lung (HCC) 11/12/2016   Chronic bilateral low back pain without sciatica 08/21/2016   Obstructive sleep apnea (adult) (pediatric) 02/22/2015    Hyperlipidemia 06/03/2013   Vitamin D deficiency 06/03/2013   GERD (gastroesophageal reflux disease) 04/28/2012   Acquired hypothyroidism 04/28/2012   Nicotine abuse 04/28/2012   Depression 04/28/2012   Chest pain, atypical 04/28/2012   PCP:  Jacquelin Hawking, PA-C Pharmacy:   [facility] [phone] - [address] [place] - [address] [place] [address] Phone: [phone] Fax: [phone]  [place] [facility] [address] [place] [address] Phone: [phone] Fax: [phone]  [id number] [place], [place] - [address] AT [facility] OF [address] [place] [address] Phone: [phone] Fax: [phone]  [facility] #[id number] [place], [place] - [address] AT [facility] & [address] [place] [address] Phone: [phone] Fax: [phone]     Social Determinants of Health (SDOH) Social History: SDOH Screenings   Food Insecurity: No Food Insecurity (04/17/2023)  Recent Concern: Food Insecurity - Food Insecurity Present (04/07/2023)   Received from [facility]  Housing: Low Risk  (04/17/2023)  Recent Concern: Housing - Medium Risk (04/12/2023)  Transportation Needs: No Transportation Needs (04/17/2023)  Recent Concern: Transportation Needs - Unmet Transportation Needs (04/12/2023)  Utilities: Not At Risk (04/17/2023)  Financial Resource Strain: Medium Risk (04/07/2023)   Received from [facility]  Physical Activity: Unknown (04/07/2023)   Received from [facility]  Social Connections: Moderately Integrated (04/07/2023)   Received from [facility]  Stress: Stress Concern Present (04/07/2023)   [facility] from [facility]  Health  Tobacco Use: High Risk (04/16/2023)   SDOH Interventions:     Readmission Risk Interventions    04/17/2023    4:34 PM 04/13/2023    6:12 PM 03/09/2023    4:52 PM  Readmission Risk  Prevention Plan  Transportation Screening Complete Complete Complete  Medication Review Oceanographer) Complete Complete Complete  PCP or Specialist appointment within 3-5 days of discharge Complete Complete Complete  HRI or Home Care Consult Complete Complete Complete  SW Recovery Care/Counseling Consult Complete Complete Complete  Palliative Care Screening Not Applicable Not Applicable Not Applicable  Skilled Nursing Facility Not Applicable Not Applicable Not Applicable

## 2023-04-17 NOTE — Assessment & Plan Note (Signed)
-   Continue on diltiazem infusion to his heart rate goal of <100 -Continue home metoprolol - Continue Eliquis

## 2023-04-18 ENCOUNTER — Other Ambulatory Visit (HOSPITAL_COMMUNITY): Payer: Self-pay

## 2023-04-18 DIAGNOSIS — J441 Chronic obstructive pulmonary disease with (acute) exacerbation: Secondary | ICD-10-CM | POA: Diagnosis not present

## 2023-04-18 MED ORDER — LEVOFLOXACIN 500 MG PO TABS
500.0000 mg | ORAL_TABLET | Freq: Every day | ORAL | 0 refills | Status: AC
  Filled 2023-04-18: qty 5, 5d supply, fill #0

## 2023-04-18 MED ORDER — LEVOFLOXACIN 500 MG PO TABS
500.0000 mg | ORAL_TABLET | Freq: Every day | ORAL | Status: DC
  Administered 2023-04-18: 500 mg via ORAL
  Filled 2023-04-18: qty 1

## 2023-04-18 MED ORDER — DM-GUAIFENESIN ER 30-600 MG PO TB12
1.0000 | ORAL_TABLET | Freq: Two times a day (BID) | ORAL | 0 refills | Status: DC
  Filled 2023-04-18: qty 28, 14d supply, fill #0

## 2023-04-18 MED ORDER — LEVALBUTEROL HCL 0.63 MG/3ML IN NEBU
0.6300 mg | INHALATION_SOLUTION | Freq: Two times a day (BID) | RESPIRATORY_TRACT | Status: DC
  Filled 2023-04-18: qty 3

## 2023-04-18 MED ORDER — PREDNISONE 10 MG PO TABS
ORAL_TABLET | ORAL | 0 refills | Status: DC
  Filled 2023-04-18: qty 60, 12d supply, fill #0

## 2023-04-18 NOTE — Progress Notes (Signed)
0981 patient alert x4 able to make needs known call light in reach ambulated to bathroom

## 2023-04-18 NOTE — TOC Transition Note (Signed)
Transition of Care [facility]) - CM/SW Discharge Note   Patient Details  Name: Tracy Park MRN: [id number] Date of Birth: November 14, 1955  Transition of Care [facility]) CM/SW Contact:  Adrian Prows, RN Phone Number: 04/18/2023, 11:33 AM   Clinical Narrative:    D/C orders received; pt says she needs travel tank and her medications from [facility] outpatient pharmacy; pt transport home by [facility]; pt verified home address [address] [place], [place] [address]; spoke w/ Vaughan Basta at [facility] and travel tank will be delivered to pt's room; Christy, RN will facilitate meds from pharmacy being delivered to beds; she will also call [facility] when pt ready for transport; no TOC needs.   Final next level of care: Home/Self Care Barriers to Discharge: No Barriers Identified   Patient Goals and CMS Choice CMS Medicare.gov Compare Post Acute Care list provided to:: Patient Choice offered to / list presented to : Patient  Discharge Placement                         Discharge Plan and Services Additional resources added to the After Visit Summary for   In-house Referral: NA Discharge Planning Services: CM Consult Post Acute Care Choice: Resumption of Svcs/PTA Provider, Durable Medical Equipment (Home oxygen w/[employer])          DME Arranged: Oxygen DME Agency: [employer] Date DME Agency Contacted: 04/17/23 Time DME Agency Contacted: 1427 Representative spoke with at DME Agency: Vaughan Basta HH Arranged: NA HH Agency: NA        Social Determinants of Health (SDOH) Interventions SDOH Screenings   Food Insecurity: No Food Insecurity (04/17/2023)  Recent Concern: Food Insecurity - Food Insecurity Present (04/07/2023)   Received from [facility]  Housing: Low Risk  (04/17/2023)  Recent Concern: Housing - Medium Risk (04/12/2023)  Transportation Needs: No Transportation Needs (04/17/2023)  Recent Concern: Transportation Needs - Unmet Transportation Needs (04/12/2023)  Utilities: Not At Risk  (04/17/2023)  Financial Resource Strain: Medium Risk (04/07/2023)   Received from [facility]  Physical Activity: Unknown (04/07/2023)   Received from [facility]  Social Connections: Moderately Integrated (04/07/2023)   Received from [facility]  Stress: Stress Concern Present (04/07/2023)   Received from [facility]  Tobacco Use: High Risk (04/16/2023)     Readmission Risk Interventions    04/17/2023    4:34 PM 04/13/2023    6:12 PM 03/09/2023    4:52 PM  Readmission Risk Prevention Plan  Transportation Screening Complete Complete Complete  Medication Review [employer]) Complete Complete Complete  PCP or Specialist appointment within 3-5 days of discharge Complete Complete Complete  HRI or Home Care Consult Complete Complete Complete  SW Recovery Care/Counseling Consult Complete Complete Complete  Palliative Care Screening Not Applicable Not Applicable Not Applicable  Skilled Nursing Facility Not Applicable Not Applicable Not Applicable

## 2023-04-18 NOTE — Plan of Care (Signed)
  Problem: Education: Goal: Knowledge of General Education information will improve Description: Including pain rating scale, medication(s)/side effects and non-pharmacologic comfort measures 04/18/2023 1232 by Amil Amen, RN Outcome: Adequate for Discharge 04/18/2023 1232 by Amil Amen, RN Outcome: Adequate for Discharge   Problem: Health Behavior/Discharge Planning: Goal: Ability to manage health-related needs will improve 04/18/2023 1232 by Amil Amen, RN Outcome: Adequate for Discharge 04/18/2023 1232 by Amil Amen, RN Outcome: Adequate for Discharge   Problem: Clinical Measurements: Goal: Ability to maintain clinical measurements within normal limits will improve 04/18/2023 1232 by Amil Amen, RN Outcome: Adequate for Discharge 04/18/2023 1232 by Amil Amen, RN Outcome: Adequate for Discharge Goal: Will remain free from infection 04/18/2023 1232 by Amil Amen, RN Outcome: Adequate for Discharge 04/18/2023 1232 by Amil Amen, RN Outcome: Adequate for Discharge Goal: Diagnostic test results will improve 04/18/2023 1232 by Amil Amen, RN Outcome: Adequate for Discharge 04/18/2023 1232 by Amil Amen, RN Outcome: Adequate for Discharge Goal: Respiratory complications will improve 04/18/2023 1232 by Amil Amen, RN Outcome: Adequate for Discharge 04/18/2023 1232 by Amil Amen, RN Outcome: Adequate for Discharge Goal: Cardiovascular complication will be avoided 04/18/2023 1232 by Amil Amen, RN Outcome: Adequate for Discharge 04/18/2023 1232 by Amil Amen, RN Outcome: Adequate for Discharge   Problem: Activity: Goal: Risk for activity intolerance will decrease 04/18/2023 1232 by Amil Amen, RN Outcome: Adequate for Discharge 04/18/2023 1232 by Amil Amen, RN Outcome: Adequate for Discharge   Problem: Nutrition: Goal: Adequate nutrition will be maintained 04/18/2023 1232 by Amil Amen, RN Outcome: Adequate for Discharge 04/18/2023 1232 by Amil Amen, RN Outcome:  Adequate for Discharge   Problem: Coping: Goal: Level of anxiety will decrease 04/18/2023 1232 by Amil Amen, RN Outcome: Adequate for Discharge 04/18/2023 1232 by Amil Amen, RN Outcome: Adequate for Discharge   Problem: Elimination: Goal: Will not experience complications related to bowel motility 04/18/2023 1232 by Amil Amen, RN Outcome: Adequate for Discharge 04/18/2023 1232 by Amil Amen, RN Outcome: Adequate for Discharge Goal: Will not experience complications related to urinary retention 04/18/2023 1232 by Amil Amen, RN Outcome: Adequate for Discharge 04/18/2023 1232 by Amil Amen, RN Outcome: Adequate for Discharge   Problem: Pain Managment: Goal: General experience of comfort will improve 04/18/2023 1232 by Amil Amen, RN Outcome: Adequate for Discharge 04/18/2023 1232 by Amil Amen, RN Outcome: Adequate for Discharge   Problem: Safety: Goal: Ability to remain free from injury will improve 04/18/2023 1232 by Amil Amen, RN Outcome: Adequate for Discharge 04/18/2023 1232 by Amil Amen, RN Outcome: Adequate for Discharge   Problem: Skin Integrity: Goal: Risk for impaired skin integrity will decrease 04/18/2023 1232 by Amil Amen, RN Outcome: Adequate for Discharge 04/18/2023 1232 by Amil Amen, RN Outcome: Adequate for Discharge

## 2023-04-18 NOTE — Plan of Care (Signed)

## 2023-04-18 NOTE — Progress Notes (Signed)
Patient was discharged home with medications and oxygen tank. Patient is waiting on [facility] to transport her home.

## 2023-04-18 NOTE — Discharge Summary (Signed)
Physician Discharge Summary  AVORY PLATERO TDD:[id number] DOB: 1955-09-19 DOA: 04/16/2023  PCP: Jacquelin Hawking, PA-C  Admit date: 04/16/2023 Discharge date: 04/18/2023  Admitted From: Home Discharge disposition: Home  Recommendations at discharge:  Complete the course of antibiotics Stop smoking Encourage compliance with bronchodilators Outpatient referral to pulmonology given as requested.    Brief narrative: Tracy Park is a 67 y.o. female with PMH significant for obesity, HTN, HLD, CAD/MI, paroxysmal A-fib on anticoagulation, COPD on 2 L oxygen, adenocarcinoma of lung s/p left upper lobectomy, current daily smoker, hypothyroidism, bipolar disorder, anxiety, PTSD, nephrolithiasis  Patient has had multiple hospitalizations and ED visits in the last few months for COPD exacerbation. Most recently, hospitalized 8/2 to 8/4 for COPD flareup without pneumonia.  It seems a sputum culture at that time showed few growth of pansensitive Pseudomonas, did not require antibiotics at that time.. Continues to smoke 3 to 5 cigarettes a day.  8/7, patient presented to the ED from home after 72 hours of last hospital discharge with complaint of shortness of breath, wheezing. In the ED, patient was afebrile, heart rate in 90s, blood pressure 140s, at rest on 2 L oxygen  Labs with WBC count 11.2, hemoglobin normal, BMP unremarkable COVID PCR negative Chest x-ray showed chronic emphysema, increased bronchial thickening Patient was given IV Solu-Medrol, multiple DuoNebs, IV magnesium.  While in the ED, patient had a run of A-fib with RVR to 120s and was started on Cardizem drip. Admitted to [facility]   Subjective: Patient was seen and examined this morning. Sitting up in bed.  Less wheezing than yesterday.  Able to ambulate on the hallway with 2 L oxygen which is her baseline..  Feels ready to go home.  Hospital course: Acute exacerbation of COPD Recurrent hospitalization for COPD  exacerbation Pseudomonas in the sputum Current everyday smoker Presented with yet another episode of shortness of breath, wheezing Chest x-ray did not show evidence of pneumonia but showed increased bronchial thickening Recently sputum culture from 8/4 showed growth of pansensitive Pseudomonas.  She was not treated with antibiotics at that time.  With the recurrent flareup of COPD, she was started on antibiotic trial this time with IV cefepime.   Also started on IV Solu-Medrol. Clinically improving.  Less wheezing today.  Able to ambulate 2 L.  Feels ready to go home. Discharged home 5 more days of oral Levaquin and a tapering course of prednisone. Counseled to quit smoking.  Nicotine patch offered Continue bronchodilators, Mucinex, incentive spirometry, encourage ambulation. Has not followed up with a pulmonologist at [facility] for a while.  She states he has transportation issues.  She prefers to follow-up with a new pulmonologist in town.  Outpatient referral given for [facility] pulmonology Recent Labs  Lab 04/11/23 1813 04/12/23 0500 04/13/23 0414 04/16/23 2049 04/17/23 0309  WBC 11.4* 5.8 10.1 11.2* 9.0   H/o adenocarcinoma of lung 2018 S/p left upper lobectomy, chemo.  Oncologist at [facility] with RVR H/o paroxysmal A-fib PTA on Toprol 12.5 mg daily In the ED, patient was in RVR in the 120s and was started on Cardizem drip stopped after few hours Back on Toprol 12.5 mg daily..  Continue the same. Continue long-term anticoagulation with Eliquis PAD  Essential hypertension Continue Toprol, losartan  CAD/MI HLD Continue metoprolol, Eliquis, statin  GERD PPI   Acquired hypothyroidism Continue levothyroxine  Goals of care   Code Status: DNR   Wounds:  -    Discharge Exam:   Vitals:  04/18/23 0800 04/18/23 0801 04/18/23 0903 04/18/23 1024  BP: (!) 133/91 (!) 133/91 119/84 121/72  Pulse: 76 63 69 77  Resp: (!) 24 (!) 24 20 (!) 22  Temp: 98.6 F (37 C)   97.7  F (36.5 C)  TempSrc: Oral   Oral  SpO2: 97% 97% 91% 94%  Weight:      Height:        Body mass index is 18.59 kg/m.   General exam: Pleasant, elderly Caucasian female.  Not in physical distress Skin: No rashes, lesions or ulcers. HEENT: Atraumatic, normocephalic, no obvious bleeding Lungs: Mild scattered end expiratory wheezing much better than yesterday. CVS: Regular rate and rhythm, no murmur GI/Abd: soft, nontender, nondistended, bowel sound present CNS: Alert, awake, oriented x 3 Psychiatry: Mood appropriate Extremities: No pedal edema, no calf tenderness  Follow ups:    Follow-up Information     Jacquelin Hawking, PA-C Follow up.   Specialty: Physician Assistant Contact information: [address] [place] [address] [phone]         [facility] Follow up.   Specialty: Pulmonology Contact information: [address] [place] [address] [phone]                Discharge Instructions:   Discharge Instructions     Ambulatory referral to Pulmonology   Complete by: As directed    Reason for referral: Asthma/COPD   Call MD for:  difficulty breathing, headache or visual disturbances   Complete by: As directed    Call MD for:  extreme fatigue   Complete by: As directed    Call MD for:  hives   Complete by: As directed    Call MD for:  persistant dizziness or light-headedness   Complete by: As directed    Call MD for:  persistant nausea and vomiting   Complete by: As directed    Call MD for:  severe uncontrolled pain   Complete by: As directed    Call MD for:  temperature >100.4   Complete by: As directed    Diet general   Complete by: As directed    Discharge instructions   Complete by: As directed    Recommendations at discharge:   Complete the course of antibiotics  Stop smoking  Encourage compliance with bronchodilators  Outpatient referral to  pulmonology given as requested.  General discharge instructions: Follow with Primary MD Jacquelin Hawking, PA-C in 7 days  Please request your PCP  to go over your hospital tests, procedures, radiology results at the follow up. Please get your medicines reviewed and adjusted.  Your PCP may decide to repeat certain labs or tests as needed. Do not drive, operate heavy machinery, perform activities at heights, swimming or participation in water activities or provide baby sitting services if your were admitted for syncope or siezures until you have seen by Primary MD or a Neurologist and advised to do so again. [place] Controlled Substance Reporting System database was reviewed. Do not drive, operate heavy machinery, perform activities at heights, swim, participate in water activities or provide baby-sitting services while on medications for pain, sleep and mood until your outpatient physician has reevaluated you and advised to do so again.  You are strongly recommended to comply with the dose, frequency and duration of prescribed medications. Activity: As tolerated with Full fall precautions use walker/cane & assistance as needed Avoid using any recreational substances like cigarette,  tobacco, alcohol, or non-prescribed drug. If you experience worsening of your admission symptoms, develop shortness of breath, life threatening emergency, suicidal or homicidal thoughts you must seek medical attention immediately by calling 911 or calling your MD immediately  if symptoms less severe. You must read complete instructions/literature along with all the possible adverse reactions/side effects for all the medicines you take and that have been prescribed to you. Take any new medicine only after you have completely understood and accepted all the possible adverse reactions/side effects.  Wear Seat belts while driving. You were cared for by a hospitalist during your hospital stay. If you have any questions about  your discharge medications or the care you received while you were in the hospital after you are discharged, you can call the unit and ask to speak with the hospitalist or the covering physician. Once you are discharged, your primary care physician will handle any further medical issues. Please note that NO REFILLS for any discharge medications will be authorized once you are discharged, as it is imperative that you return to your primary care physician (or establish a relationship with a primary care physician if you do not have one).   Increase activity slowly   Complete by: As directed        Discharge Medications:   Allergies as of 04/18/2023       Reactions   Red Dye Hives, Itching, Other (See Comments)   Red food dye   Oxycontin [oxycodone Hcl] Other (See Comments)   Hallucinations    Prednisone Hives, Nausea And Vomiting   Aspirin Hives   Strawberry Extract Hives, Itching   Tomato Hives, Itching   Tape Rash, Other (See Comments)   Prefers paper tape   Wound Dressing Adhesive Rash        Medication List     TAKE these medications    acetaminophen 500 MG tablet Commonly known as: TYLENOL Take 500 mg by mouth every 6 (six) hours as needed for moderate pain.   albuterol 108 (90 Base) MCG/ACT inhaler Commonly known as: VENTOLIN HFA Inhale 2 puffs into the lungs every 6 (six) hours as needed for wheezing or shortness of breath. What changed: Another medication with the same name was changed. Make sure you understand how and when to take each.   albuterol (2.5 MG/3ML) 0.083% nebulizer solution Commonly known as: PROVENTIL Take 3 mLs (2.5 mg total) by nebulization every 6 (six) hours as needed for wheezing or shortness of breath. What changed:  when to take this additional instructions   ALPRAZolam 0.5 MG tablet Commonly known as: XANAX Take 0.5 mg by mouth 2 (two) times daily as needed for anxiety.   Breztri Aerosphere 160-9-4.8 MCG/ACT Aero Generic drug:  Budeson-Glycopyrrol-Formoterol Inhale 1 puff into the lungs in the morning and at bedtime.   dextromethorphan-guaiFENesin 30-600 MG 12hr tablet Commonly known as: MUCINEX DM Take 1 tablet by mouth 2 (two) times daily for 14 days.   Eliquis 5 MG Tabs tablet Generic drug: apixaban Take 5 mg by mouth 2 (two) times daily.   levofloxacin 500 MG tablet Commonly known as: LEVAQUIN Take 1 tablet (500 mg total) by mouth daily for 5 days. Start taking on: April 19, 2023   levothyroxine 125 MCG tablet Commonly known as: SYNTHROID Take 125 mcg by mouth daily before breakfast.   losartan 25 MG tablet Commonly known as: COZAAR Take 12.5 mg by mouth daily.   Magnesium Oxide 400 MG Caps Take 1 capsule (400 mg total) by mouth  daily at 12 noon. What changed: when to take this   metoprolol succinate 25 MG 24 hr tablet Commonly known as: TOPROL-XL Take 12.5 mg by mouth daily.   nitroGLYCERIN 0.4 MG SL tablet Commonly known as: NITROSTAT Place 0.4 mg under the tongue every 5 (five) minutes as needed for chest pain.   OXYGEN Inhale 2 L/min into the lungs continuous.   pantoprazole 40 MG tablet Commonly known as: PROTONIX Take 40 mg by mouth daily before breakfast.   predniSONE 10 MG tablet Commonly known as: DELTASONE Take 6 tabs twice a day for 2 days, then take 4 tabs twice a day for 2 days, then take 4 tabs daily for 2 days, then take 3 tabs daily for 2 days, then take 2 tabs daily for 2 days, then take 1 tab daily for 2 days, then STOP. What changed:  medication strength how much to take how to take this when to take this additional instructions   rosuvastatin 20 MG tablet Commonly known as: CRESTOR Take 20 mg by mouth daily.   Systane Complete PF 0.6 % Soln Generic drug: Propylene Glycol (PF) Place 1 drop into both eyes 3 (three) times daily as needed (for dryness).   tamsulosin 0.4 MG Caps capsule Commonly known as: FLOMAX Take 0.4 mg by mouth daily.         The  results of significant diagnostics from this hospitalization (including imaging, microbiology, ancillary and laboratory) are listed below for reference.    Procedures and Diagnostic Studies:   DG Chest 2 View  Result Date: 04/16/2023 CLINICAL DATA:  Shortness of breath and wheezing. EXAM: CHEST - 2 VIEW COMPARISON:  04/11/2023, multiple priors.  Most recent CT 03/08/2023 FINDINGS: Emphysema with chronic hyperinflation. Bronchial thickening is chronic but increased. Chronic left lung volume loss with pleuroparenchymal opacity at the apex. The left lower lobe nodule on prior CT is not well-defined by radiograph. No acute airspace disease, pneumothorax or significant pleural effusion. Stable heart size and mediastinal contours. Postsurgical change at the left hilum. No acute osseous abnormalities are seen. IMPRESSION: 1. Emphysema with chronic hyperinflation. Bronchial thickening is chronic but increased, possible COPD exacerbation. 2. Chronic postsurgical left lung volume loss with pleuroparenchymal opacity at the apex. The left lower lobe nodule on prior CT is not well-defined by radiograph. Electronically Signed   By: Narda Rutherford M.D.   On: 04/16/2023 18:42     Labs:   Basic Metabolic Panel: Recent Labs  Lab 04/11/23 1813 04/12/23 0500 04/13/23 0414 04/16/23 2049  NA 141 139 140 140  K 3.5 3.8 4.2 3.7  CL 97* 101 101 99  CO2 36* 33* 34* 33*  GLUCOSE 101* 151* 157* 96  BUN 18 22 24* 15  CREATININE 0.62 0.55 0.62 0.63  CALCIUM 8.7* 8.0* 8.4* 8.5*   GFR Estimated Creatinine Clearance: 52.7 mL/min (by C-G formula based on SCr of 0.63 mg/dL). Liver Function Tests: Recent Labs  Lab 04/11/23 1813 04/12/23 0500  AST 17 14*  ALT 18 18  ALKPHOS 59 53  BILITOT 0.4 0.3  PROT 6.4* 5.8*  ALBUMIN 3.6 3.0*   No results for input(s): "LIPASE", "AMYLASE" in the last 168 hours. No results for input(s): "AMMONIA" in the last 168 hours. Coagulation profile No results for input(s): "INR",  "PROTIME" in the last 168 hours.  CBC: Recent Labs  Lab 04/11/23 1813 04/12/23 0500 04/13/23 0414 04/16/23 2049 04/17/23 0309  WBC 11.4* 5.8 10.1 11.2* 9.0  NEUTROABS 9.1*  --   --  7.4  --   HGB 12.1 11.0* 10.7* 12.6 12.7  HCT 41.7 37.3 35.9* 40.7 42.0  MCV 95.9 94.4 95.2 92.7 92.3  PLT 212 195 178 195 210   Cardiac Enzymes: No results for input(s): "CKTOTAL", "CKMB", "CKMBINDEX", "TROPONINI" in the last 168 hours. BNP: Invalid input(s): "POCBNP" CBG: No results for input(s): "GLUCAP" in the last 168 hours. D-Dimer Recent Labs    04/16/23 2049  DDIMER 0.42   Hgb A1c No results for input(s): "HGBA1C" in the last 72 hours. Lipid Profile No results for input(s): "CHOL", "HDL", "LDLCALC", "TRIG", "CHOLHDL", "LDLDIRECT" in the last 72 hours. Thyroid function studies No results for input(s): "TSH", "T4TOTAL", "T3FREE", "THYROIDAB" in the last 72 hours.  Invalid input(s): "FREET3" Anemia work up No results for input(s): "VITAMINB12", "FOLATE", "FERRITIN", "TIBC", "IRON", "RETICCTPCT" in the last 72 hours. Microbiology Recent Results (from the past 240 hour(s))  Resp panel by RT-PCR (RSV, Flu A&B, Covid) Anterior Nasal Swab     Status: None   Collection Time: 04/11/23  8:53 PM   Specimen: Anterior Nasal Swab  Result Value Ref Range Status   SARS Coronavirus 2 by RT PCR NEGATIVE NEGATIVE Final    Comment: (NOTE) SARS-CoV-2 target nucleic acids are NOT DETECTED.  The SARS-CoV-2 RNA is generally detectable in upper respiratory specimens during the acute phase of infection. The lowest concentration of SARS-CoV-2 viral copies this assay can detect is 138 copies/mL. A negative result does not preclude SARS-Cov-2 infection and should not be used as the sole basis for treatment or other patient management decisions. A negative result may occur with  improper specimen collection/handling, submission of specimen other than nasopharyngeal swab, presence of viral mutation(s)  within the areas targeted by this assay, and inadequate number of viral copies(<138 copies/mL). A negative result must be combined with clinical observations, patient history, and epidemiological information. The expected result is Negative.  Fact Sheet for Patients:  BloggerCourse.com  Fact Sheet for Healthcare Providers:  SeriousBroker.it  This test is no t yet approved or cleared by the [place] FDA and  has been authorized for detection and/or diagnosis of SARS-CoV-2 by FDA under an Emergency Use Authorization (EUA). This EUA will remain  in effect (meaning this test can be used) for the duration of the COVID-19 declaration under Section 564(b)(1) of the Act, 21 U.S.C.section 360bbb-3(b)(1), unless the authorization is terminated  or revoked sooner.       Influenza A by PCR NEGATIVE NEGATIVE Final   Influenza B by PCR NEGATIVE NEGATIVE Final    Comment: (NOTE) The Xpert Xpress SARS-CoV-2/FLU/RSV plus assay is intended as an aid in the diagnosis of influenza from Nasopharyngeal swab specimens and should not be used as a sole basis for treatment. Nasal washings and aspirates are unacceptable for Xpert Xpress SARS-CoV-2/FLU/RSV testing.  Fact Sheet for Patients: BloggerCourse.com  Fact Sheet for Healthcare Providers: SeriousBroker.it  This test is not yet approved or cleared by the [place] FDA and has been authorized for detection and/or diagnosis of SARS-CoV-2 by FDA under an Emergency Use Authorization (EUA). This EUA will remain in effect (meaning this test can be used) for the duration of the COVID-19 declaration under Section 564(b)(1) of the Act, 21 U.S.C. section 360bbb-3(b)(1), unless the authorization is terminated or revoked.     Resp Syncytial Virus by PCR NEGATIVE NEGATIVE Final    Comment: (NOTE) Fact Sheet for  Patients: BloggerCourse.com  Fact Sheet for Healthcare Providers: SeriousBroker.it  This test is not yet approved or cleared by  the [employer] and has been authorized for detection and/or diagnosis of SARS-CoV-2 by FDA under an Emergency Use Authorization (EUA). This EUA will remain in effect (meaning this test can be used) for the duration of the COVID-19 declaration under Section 564(b)(1) of the Act, 21 U.S.C. section 360bbb-3(b)(1), unless the authorization is terminated or revoked.  Performed at [facility], [address], [place], [place] [address]   Expectorated Sputum Assessment w Gram Stain, Rflx to Resp Cult     Status: None   Collection Time: 04/13/23  6:30 AM   Specimen: Expectorated Sputum  Result Value Ref Range Status   Specimen Description EXPECTORATED SPUTUM  Final   Special Requests NONE  Final   Sputum evaluation   Final    THIS SPECIMEN IS ACCEPTABLE FOR SPUTUM CULTURE Performed at [facility], [address], [place], [place] [address]    Report Status 04/13/2023 FINAL  Final  Culture, Respiratory w Gram Stain     Status: None   Collection Time: 04/13/23  6:30 AM  Result Value Ref Range Status   Specimen Description   Final    EXPECTORATED SPUTUM Performed at [facility], [address] [place], [place] [address]    Special Requests   Final    NONE Reflexed from [id number] Performed at [facility], [address] [place], [place] [address]    Gram Stain   Final    MODERATE WBC PRESENT,BOTH PMN AND MONONUCLEAR FEW GRAM POSITIVE COCCI IN PAIRS FEW GRAM POSITIVE COCCI IN CLUSTERS FEW GRAM NEGATIVE RODS Performed at [facility], [address], [place], [place] [address]    Culture ABUNDANT PSEUDOMONAS AERUGINOSA  Final   Report Status 04/15/2023 FINAL  Final   Organism ID, Bacteria PSEUDOMONAS AERUGINOSA  Final       Susceptibility   Pseudomonas aeruginosa - MIC*    CEFTAZIDIME 2 SENSITIVE Sensitive     CIPROFLOXACIN <=0.25 SENSITIVE Sensitive     GENTAMICIN <=1 SENSITIVE Sensitive     IMIPENEM <=0.25 SENSITIVE Sensitive     PIP/TAZO <=4 SENSITIVE Sensitive     CEFEPIME 2 SENSITIVE Sensitive     * ABUNDANT PSEUDOMONAS AERUGINOSA  Respiratory (~20 pathogens) panel by PCR     Status: None   Collection Time: 04/13/23 12:48 PM   Specimen: Nasopharyngeal Swab; Respiratory  Result Value Ref Range Status   Adenovirus NOT DETECTED NOT DETECTED Final   Coronavirus 229E NOT DETECTED NOT DETECTED Final    Comment: (NOTE) The Coronavirus on the Respiratory Panel, DOES NOT test for the novel  Coronavirus (2019 nCoV)    Coronavirus HKU1 NOT DETECTED NOT DETECTED Final   Coronavirus NL63 NOT DETECTED NOT DETECTED Final   Coronavirus OC43 NOT DETECTED NOT DETECTED Final   Metapneumovirus NOT DETECTED NOT DETECTED Final   Rhinovirus / Enterovirus NOT DETECTED NOT DETECTED Final   Influenza A NOT DETECTED NOT DETECTED Final   Influenza B NOT DETECTED NOT DETECTED Final   Parainfluenza Virus 1 NOT DETECTED NOT DETECTED Final   Parainfluenza Virus 2 NOT DETECTED NOT DETECTED Final   Parainfluenza Virus 3 NOT DETECTED NOT DETECTED Final   Parainfluenza Virus 4 NOT DETECTED NOT DETECTED Final   Respiratory Syncytial Virus NOT DETECTED NOT DETECTED Final   Bordetella pertussis NOT DETECTED NOT DETECTED Final   Bordetella Parapertussis NOT DETECTED NOT DETECTED Final   Chlamydophila pneumoniae NOT DETECTED NOT DETECTED Final   Mycoplasma pneumoniae NOT DETECTED NOT  DETECTED Final    Comment: Performed at [facility], [address], [place], [place] [address]  MRSA Next Gen by PCR, Nasal     Status: None   Collection Time: 04/17/23  3:09 AM   Specimen: Nasal Mucosa; Nasal Swab  Result Value Ref Range Status   MRSA by PCR Next Gen NOT DETECTED NOT DETECTED Final    Comment: (NOTE) The GeneXpert MRSA  Assay (FDA approved for NASAL specimens only), is one component of a comprehensive MRSA colonization surveillance program. It is not intended to diagnose MRSA infection nor to guide or monitor treatment for MRSA infections. Test performance is not FDA approved in patients less than 29 years old. Performed at [facility], [address] [place], [place] [address]   SARS Coronavirus 2 by RT PCR (hospital order, performed in [facility] hospital lab) *cepheid single result test* Anterior Nasal Swab     Status: None   Collection Time: 04/17/23  3:24 AM   Specimen: Anterior Nasal Swab  Result Value Ref Range Status   SARS Coronavirus 2 by RT PCR NEGATIVE NEGATIVE Final    Comment: (NOTE) SARS-CoV-2 target nucleic acids are NOT DETECTED.  The SARS-CoV-2 RNA is generally detectable in upper and lower respiratory specimens during the acute phase of infection. The lowest concentration of SARS-CoV-2 viral copies this assay can detect is 250 copies / mL. A negative result does not preclude SARS-CoV-2 infection and should not be used as the sole basis for treatment or other patient management decisions.  A negative result may occur with improper specimen collection / handling, submission of specimen other than nasopharyngeal swab, presence of viral mutation(s) within the areas targeted by this assay, and inadequate number of viral copies (<250 copies / mL). A negative result must be combined with clinical observations, patient history, and epidemiological information.  Fact Sheet for Patients:   RoadLapTop.co.za  Fact Sheet for Healthcare Providers: http://kim-miller.com/  This test is not yet approved or  cleared by the [place] FDA and has been authorized for detection and/or diagnosis of SARS-CoV-2 by FDA under an Emergency Use Authorization (EUA).  This EUA will remain in effect (meaning this test can be used) for the  duration of the COVID-19 declaration under Section 564(b)(1) of the Act, 21 U.S.C. section 360bbb-3(b)(1), unless the authorization is terminated or revoked sooner.  Performed at [facility], [address], [place], [place] [address]     Time coordinating discharge: 45 minutes  Signed: Melina Schools   [facility] 04/18/2023, 11:11 AM

## 2023-04-18 NOTE — Plan of Care (Signed)

## 2023-04-18 NOTE — Plan of Care (Signed)
  Problem: Clinical Measurements: Goal: Ability to maintain clinical measurements within normal limits will improve Outcome: Progressing   

## 2023-04-26 ENCOUNTER — Encounter (HOSPITAL_COMMUNITY): Payer: Self-pay

## 2023-04-26 ENCOUNTER — Emergency Department (HOSPITAL_COMMUNITY): Payer: 59

## 2023-04-26 ENCOUNTER — Other Ambulatory Visit: Payer: Self-pay

## 2023-04-26 ENCOUNTER — Emergency Department (HOSPITAL_COMMUNITY)
Admission: EM | Admit: 2023-04-26 | Discharge: 2023-04-26 | Disposition: A | Discharge status: Home / Self Care | Payer: 59 | Source: Home / Self Care | Attending: Emergency Medicine | Admitting: Emergency Medicine

## 2023-04-26 DIAGNOSIS — Z79899 Other long term (current) drug therapy: Secondary | ICD-10-CM | POA: Insufficient documentation

## 2023-04-26 DIAGNOSIS — Z85118 Personal history of other malignant neoplasm of bronchus and lung: Secondary | ICD-10-CM | POA: Insufficient documentation

## 2023-04-26 DIAGNOSIS — I10 Essential (primary) hypertension: Secondary | ICD-10-CM | POA: Insufficient documentation

## 2023-04-26 DIAGNOSIS — Z20822 Contact with and (suspected) exposure to covid-19: Secondary | ICD-10-CM | POA: Insufficient documentation

## 2023-04-26 DIAGNOSIS — R0602 Shortness of breath: Secondary | ICD-10-CM | POA: Diagnosis not present

## 2023-04-26 DIAGNOSIS — J441 Chronic obstructive pulmonary disease with (acute) exacerbation: Secondary | ICD-10-CM

## 2023-04-26 DIAGNOSIS — Z7901 Long term (current) use of anticoagulants: Secondary | ICD-10-CM | POA: Insufficient documentation

## 2023-04-26 LAB — CBC WITH DIFFERENTIAL/PLATELET
Abs Immature Granulocytes: 0.07 10*3/uL (ref 0.00–0.07)
Basophils Absolute: 0 10*3/uL (ref 0.0–0.1)
Basophils Relative: 0 %
Eosinophils Absolute: 0 10*3/uL (ref 0.0–0.5)
Eosinophils Relative: 0 %
HCT: 40.1 % (ref 36.0–46.0)
Hemoglobin: 12.5 g/dL (ref 12.0–15.0)
Immature Granulocytes: 1 %
Lymphocytes Relative: 15 %
Lymphs Abs: 1.8 10*3/uL (ref 0.7–4.0)
MCH: 28.9 pg (ref 26.0–34.0)
MCHC: 31.2 g/dL (ref 30.0–36.0)
MCV: 92.6 fL (ref 80.0–100.0)
Monocytes Absolute: 0.6 10*3/uL (ref 0.1–1.0)
Monocytes Relative: 5 %
Neutro Abs: 9.7 10*3/uL — ABNORMAL HIGH (ref 1.7–7.7)
Neutrophils Relative %: 79 %
Platelets: 181 10*3/uL (ref 150–400)
RBC: 4.33 MIL/uL (ref 3.87–5.11)
RDW: 14.6 % (ref 11.5–15.5)
WBC: 12.2 10*3/uL — ABNORMAL HIGH (ref 4.0–10.5)
nRBC: 0 % (ref 0.0–0.2)

## 2023-04-26 LAB — BASIC METABOLIC PANEL
Anion gap: 9 (ref 5–15)
BUN: 18 mg/dL (ref 8–23)
CO2: 33 mmol/L — ABNORMAL HIGH (ref 22–32)
Calcium: 8.6 mg/dL — ABNORMAL LOW (ref 8.9–10.3)
Chloride: 96 mmol/L — ABNORMAL LOW (ref 98–111)
Creatinine, Ser: 0.51 mg/dL (ref 0.44–1.00)
GFR, Estimated: >60 mL/min (ref >60)
Glucose, Bld: 78 mg/dL (ref 70–99)
Potassium: 3.5 mmol/L (ref 3.5–5.1)
Sodium: 138 mmol/L (ref 135–145)

## 2023-04-26 LAB — CBG MONITORING, ED: Glucose-Capillary: 77 mg/dL (ref 70–99)

## 2023-04-26 LAB — SARS CORONAVIRUS 2 BY RT PCR: SARS Coronavirus 2 by RT PCR: NEGATIVE

## 2023-04-26 MED ORDER — DEXAMETHASONE SODIUM PHOSPHATE 10 MG/ML IJ SOLN
10.0000 mg | Freq: Once | INTRAMUSCULAR | Status: AC
  Administered 2023-04-26: 10 mg via INTRAVENOUS
  Filled 2023-04-26: qty 1

## 2023-04-26 MED ORDER — IPRATROPIUM-ALBUTEROL 0.5-2.5 (3) MG/3ML IN SOLN
3.0000 mL | Freq: Once | RESPIRATORY_TRACT | Status: AC
  Administered 2023-04-26: 3 mL via RESPIRATORY_TRACT
  Filled 2023-04-26: qty 3

## 2023-04-26 NOTE — ED Notes (Signed)
Pt waiting for her daughter to take her home. Pt instructed to use call light to notify nursing once her daughter is here.

## 2023-04-26 NOTE — ED Triage Notes (Signed)
Pt arrives via [facility] for sudden worsening of her shortness of breath, pt reports she began coughing and feeling more short of breath after waking up yesterday morning. Pt currently finishing her second duoneb treatment from EMS, she also received 125mg  Solumedrol, and 2gm Mag Sulfate. Pt reports history of COPD and wears [place] at 2L/min at home all the time. Pt in NAD at this time, which EMS reports is much improved from initial arrival on scene where pt was very diminished in breath sounds and tachypneic.

## 2023-04-26 NOTE — ED Provider Notes (Signed)
[facility] AT [facility] Provider Note   CSN: [id number] Arrival date & time: 04/26/23  0110     History  Chief Complaint  Patient presents with   Shortness of Breath   Cough    Tracy Park is a 67 y.o. female.  The history is provided by the patient and the EMS personnel.  Shortness of Breath Associated symptoms: cough   Cough Associated symptoms: shortness of breath   She has history of hypertension, hyperlipidemia, COPD with chronic oxygen dependence, lung cancer status post left upper lobectomy, paroxysmal atrial fibrillation anticoagulated on apixaban and comes in with cough and worsening shortness of breath since this afternoon.  Cough is productive of white to gray sputum.  She denies fever, chills, sweats.  She denies any chest pain  She was using her home nebulizer which only gave slight, temporary relief.  EMS gave intravenous magnesium and methylprednisolone and also gave 2 nebulizer treatments with albuterol and ipratropium.  Patient did note significant improvement with that treatment.   Home Medications Prior to Admission medications   Medication Sig Start Date End Date Taking? Authorizing Provider  acetaminophen (TYLENOL) 500 MG tablet Take 500 mg by mouth every 6 (six) hours as needed for moderate pain.    [provider]  albuterol (PROVENTIL) (2.5 MG/3ML) 0.083% nebulizer solution Take 3 mLs (2.5 mg total) by nebulization every 6 (six) hours as needed for wheezing or shortness of breath. Patient taking differently: Take 2.5 mg by nebulization See admin instructions. Nebulize 2.5 mg and inhale into the lungs in the morning and at bedtime 01/30/23   Tilden Fossa, MD  albuterol (VENTOLIN HFA) 108 (90 Base) MCG/ACT inhaler Inhale 2 puffs into the lungs every 6 (six) hours as needed for wheezing or shortness of breath. 01/13/23   Burnadette Pop, MD  ALPRAZolam Prudy Feeler) 0.5 MG tablet Take 0.5 mg by mouth 2 (two) times daily as  needed for anxiety. 03/30/23   [provider]  apixaban (ELIQUIS) 5 MG TABS tablet Take 5 mg by mouth 2 (two) times daily.    [provider]  Budeson-Glycopyrrol-Formoterol (BREZTRI AEROSPHERE) 160-9-4.8 MCG/ACT AERO Inhale 1 puff into the lungs in the morning and at bedtime.    [provider]  dextromethorphan-guaiFENesin (MUCINEX DM) 30-600 MG 12hr tablet Take 1 tablet by mouth 2 (two) times daily for 14 days. 04/18/23 05/02/23  Lorin Glass, MD  levothyroxine (SYNTHROID) 125 MCG tablet Take 125 mcg by mouth daily before breakfast.    [provider]  losartan (COZAAR) 25 MG tablet Take 12.5 mg by mouth daily.    [provider]  Magnesium Oxide 400 MG CAPS Take 1 capsule (400 mg total) by mouth daily at 12 noon. Patient taking differently: Take 400 mg by mouth in the morning. 03/01/22   Albertine Grates, MD  metoprolol succinate (TOPROL-XL) 25 MG 24 hr tablet Take 12.5 mg by mouth daily.    [provider]  nitroGLYCERIN (NITROSTAT) 0.4 MG SL tablet Place 0.4 mg under the tongue every 5 (five) minutes as needed for chest pain.    [provider]  OXYGEN Inhale 2 L/min into the lungs continuous.    [provider]  pantoprazole (PROTONIX) 40 MG tablet Take 40 mg by mouth daily before breakfast.    [provider]  predniSONE (DELTASONE) 10 MG tablet Take 6 tablets by mouth twice a day for 2 days THEN take 4 tablets twice a day for 2 days THEN take  4 tablets daily for 2 days THEN take 3 tablets daily for 2 days THEN take 2 tablets daily for 2 days THEN take 1 tablet daily for 2 days 04/18/23   Lorin Glass, MD  rosuvastatin (CRESTOR) 20 MG tablet Take 20 mg by mouth daily.    [provider]  SYSTANE COMPLETE PF 0.6 % SOLN Place 1 drop into both eyes 3 (three) times daily as needed (for dryness).    [provider]  tamsulosin (FLOMAX) 0.4 MG CAPS capsule Take 0.4 mg by mouth daily.    [provider]       Allergies    Red dye #40 (allura red), Oxycontin [oxycodone hcl], Prednisone, Aspirin, Strawberry extract, Tomato, Tape, and Wound dressing adhesive    Review of Systems   Review of Systems  Respiratory:  Positive for cough and shortness of breath.   All other systems reviewed and are negative.   Physical Exam Updated Vital Signs BP 118/80   Pulse 84   Temp 97.8 F (36.6 C) (Oral)   Resp 20   SpO2 100%  Physical Exam Vitals and nursing note reviewed.   67 year old female, resting comfortably and in no acute distress. Vital signs are normal. Oxygen saturation is 100%, which is normal. Head is normocephalic and atraumatic. PERRLA, EOMI. Oropharynx is clear. Neck is nontender and supple without adenopathy or JVD. Back is nontender and there is no CVA tenderness. Lungs have scattered expiratory wheezes without rales or rhonchi. Chest is nontender. Heart has regular rate and rhythm without murmur. Abdomen is soft, flat, nontender. Extremities have no cyanosis or edema. Skin is warm and dry without rash. Neurologic: Mental status is normal, cranial nerves are intact, moves all extremities equally.  ED Results / Procedures / Treatments   Labs (all labs ordered are listed, but only abnormal results are displayed) Labs Reviewed  BASIC METABOLIC PANEL - Abnormal; Notable for the following components:      Result Value   Chloride 96 (*)    CO2 33 (*)    Calcium 8.6 (*)    All other components within normal limits  CBC WITH DIFFERENTIAL/PLATELET - Abnormal; Notable for the following components:   WBC 12.2 (*)    Neutro Abs 9.7 (*)    All other components within normal limits  SARS CORONAVIRUS 2 BY RT PCR  CBG MONITORING, ED   Radiology DG Chest Port 1 View  Result Date: 04/26/2023 CLINICAL DATA:  Cough and shortness of breath. EXAM: PORTABLE CHEST 1 VIEW COMPARISON:  April 16, 2023 FINDINGS: The heart size and mediastinal contours are within normal limits. There is  stable, chronic left apical pleural thickening and adjacent areas of chronic scarring and/or atelectasis, with chronic left-sided volume loss and right to left shift of the superior mediastinum. Surgical sutures and surgical clips are also seen along the suprahilar region on the left. There is no evidence of acute infiltrate, pleural effusion or pneumothorax. Postoperative changes are seen within the visualized portion of the lower cervical spine. No acute osseous abnormalities are identified. IMPRESSION: Chronic left apical pleural thickening and adjacent areas of chronic scarring and/or atelectasis, with chronic left-sided volume loss. Electronically Signed   By: Aram Candela M.D.   On: 04/26/2023 01:56    Procedures Procedures  Cardiac monitor shows normal sinus rhythm, per my interpretation.  Medications Ordered in ED Medications  dexamethasone (DECADRON) injection 10 mg (has no administration in time range)  ipratropium-albuterol (DUONEB) 0.5-2.5 (3) MG/3ML nebulizer solution 3  mL (3 mLs Nebulization Given 04/26/23 4098)    ED Course/ Medical Decision Making/ A&P                                 Medical Decision Making Amount and/or Complexity of Data Reviewed Labs: ordered. Radiology: ordered.  Risk Prescription drug management.   COPD exacerbation.  Patient is significantly improved following nebulizer treatments and magnesium and methylprednisolone in the ambulance.  I have ordered screening labs of CBC, basic metabolic panel and I have ordered a chest x-ray to rule out pneumonia.  I have ordered PCR test to rule out COVID-19.  I have reviewed her past records, and note hospitalization for COPD exacerbation from 04/16/2023-04/18/2023, discharged with a prescription for levofloxacin and a prednisone Dosepak.  She should have been taking 30 mg today.  On further review of her past records, she has had 5 hospitalizations in the last 2 months-all for COPD exacerbation.  Following  nebulizer treatment, patient was resting comfortably and lungs are completely clear.  Chest x-ray shows no acute process.  I have independently viewed the image, and agree with radiologist's interpretation.  I have reviewed and interpreted her laboratory tests and my interpretation is elevated CO2 likely secondary to patient's known chronic respiratory failure, mild leukocytosis likely secondary to her current use of prednisone.  I feel she is safe for discharge.  I am giving her a single dose of dexamethasone and discharging her with instructions to continue her prednisone taper at home.  I am referring her back to her pulmonologist to try to optimize her care to try to prevent hospital readmission.  Final Clinical Impression(s) / ED Diagnoses Final diagnoses:  COPD exacerbation (HCC)  Chronic anticoagulation    Rx / DC Orders ED Discharge Orders     None         Dione Booze, MD 04/26/23 0345

## 2023-04-26 NOTE — Discharge Instructions (Signed)
Continue with your taper of prednisone.  It is very important that you see your pulmonologist to see if there is anything that can be done to keep you from needing to return to the hospital so often.

## 2023-04-28 ENCOUNTER — Inpatient Hospital Stay (HOSPITAL_COMMUNITY)
Admission: EM | Admit: 2023-04-28 | Discharge: 2023-05-01 | DRG: 190 | Disposition: A | Discharge status: Home Health | Payer: 59 | Attending: Internal Medicine | Admitting: Internal Medicine

## 2023-04-28 ENCOUNTER — Emergency Department (HOSPITAL_COMMUNITY): Payer: 59

## 2023-04-28 ENCOUNTER — Encounter (HOSPITAL_COMMUNITY): Payer: Self-pay | Admitting: Internal Medicine

## 2023-04-28 ENCOUNTER — Other Ambulatory Visit: Payer: Self-pay

## 2023-04-28 DIAGNOSIS — Z886 Allergy status to analgesic agent status: Secondary | ICD-10-CM

## 2023-04-28 DIAGNOSIS — D72829 Elevated white blood cell count, unspecified: Secondary | ICD-10-CM | POA: Diagnosis present

## 2023-04-28 DIAGNOSIS — Z7901 Long term (current) use of anticoagulants: Secondary | ICD-10-CM | POA: Diagnosis not present

## 2023-04-28 DIAGNOSIS — F319 Bipolar disorder, unspecified: Secondary | ICD-10-CM | POA: Diagnosis present

## 2023-04-28 DIAGNOSIS — Z902 Acquired absence of lung [part of]: Secondary | ICD-10-CM | POA: Diagnosis not present

## 2023-04-28 DIAGNOSIS — J9621 Acute and chronic respiratory failure with hypoxia: Secondary | ICD-10-CM | POA: Diagnosis present

## 2023-04-28 DIAGNOSIS — Z79890 Hormone replacement therapy (postmenopausal): Secondary | ICD-10-CM

## 2023-04-28 DIAGNOSIS — F419 Anxiety disorder, unspecified: Secondary | ICD-10-CM | POA: Diagnosis present

## 2023-04-28 DIAGNOSIS — J441 Chronic obstructive pulmonary disease with (acute) exacerbation: Secondary | ICD-10-CM | POA: Diagnosis present

## 2023-04-28 DIAGNOSIS — Z90710 Acquired absence of both cervix and uterus: Secondary | ICD-10-CM

## 2023-04-28 DIAGNOSIS — Z7952 Long term (current) use of systemic steroids: Secondary | ICD-10-CM

## 2023-04-28 DIAGNOSIS — Z79899 Other long term (current) drug therapy: Secondary | ICD-10-CM

## 2023-04-28 DIAGNOSIS — K219 Gastro-esophageal reflux disease without esophagitis: Secondary | ICD-10-CM | POA: Diagnosis present

## 2023-04-28 DIAGNOSIS — R0602 Shortness of breath: Secondary | ICD-10-CM | POA: Diagnosis present

## 2023-04-28 DIAGNOSIS — Z888 Allergy status to other drugs, medicaments and biological substances status: Secondary | ICD-10-CM

## 2023-04-28 DIAGNOSIS — Z85118 Personal history of other malignant neoplasm of bronchus and lung: Secondary | ICD-10-CM

## 2023-04-28 DIAGNOSIS — E785 Hyperlipidemia, unspecified: Secondary | ICD-10-CM | POA: Diagnosis present

## 2023-04-28 DIAGNOSIS — T380X5A Adverse effect of glucocorticoids and synthetic analogues, initial encounter: Secondary | ICD-10-CM | POA: Diagnosis present

## 2023-04-28 DIAGNOSIS — E890 Postprocedural hypothyroidism: Secondary | ICD-10-CM | POA: Diagnosis present

## 2023-04-28 DIAGNOSIS — Z1152 Encounter for screening for COVID-19: Secondary | ICD-10-CM | POA: Diagnosis not present

## 2023-04-28 DIAGNOSIS — I252 Old myocardial infarction: Secondary | ICD-10-CM | POA: Diagnosis not present

## 2023-04-28 DIAGNOSIS — I10 Essential (primary) hypertension: Secondary | ICD-10-CM | POA: Diagnosis present

## 2023-04-28 DIAGNOSIS — F4310 Post-traumatic stress disorder, unspecified: Secondary | ICD-10-CM | POA: Diagnosis present

## 2023-04-28 DIAGNOSIS — I480 Paroxysmal atrial fibrillation: Secondary | ICD-10-CM | POA: Diagnosis present

## 2023-04-28 DIAGNOSIS — Z8249 Family history of ischemic heart disease and other diseases of the circulatory system: Secondary | ICD-10-CM | POA: Diagnosis not present

## 2023-04-28 DIAGNOSIS — Z9981 Dependence on supplemental oxygen: Secondary | ICD-10-CM | POA: Diagnosis not present

## 2023-04-28 DIAGNOSIS — F17210 Nicotine dependence, cigarettes, uncomplicated: Secondary | ICD-10-CM | POA: Diagnosis present

## 2023-04-28 DIAGNOSIS — Z91148 Patient's other noncompliance with medication regimen for other reason: Secondary | ICD-10-CM

## 2023-04-28 DIAGNOSIS — Z7951 Long term (current) use of inhaled steroids: Secondary | ICD-10-CM

## 2023-04-28 DIAGNOSIS — Z716 Tobacco abuse counseling: Secondary | ICD-10-CM

## 2023-04-28 DIAGNOSIS — Z87442 Personal history of urinary calculi: Secondary | ICD-10-CM

## 2023-04-28 DIAGNOSIS — Z885 Allergy status to narcotic agent status: Secondary | ICD-10-CM

## 2023-04-28 DIAGNOSIS — Z833 Family history of diabetes mellitus: Secondary | ICD-10-CM

## 2023-04-28 DIAGNOSIS — J4489 Other specified chronic obstructive pulmonary disease: Secondary | ICD-10-CM | POA: Diagnosis present

## 2023-04-28 DIAGNOSIS — Z66 Do not resuscitate: Secondary | ICD-10-CM | POA: Diagnosis present

## 2023-04-28 DIAGNOSIS — Z91018 Allergy to other foods: Secondary | ICD-10-CM

## 2023-04-28 DIAGNOSIS — Z889 Allergy status to unspecified drugs, medicaments and biological substances status: Secondary | ICD-10-CM

## 2023-04-28 LAB — BASIC METABOLIC PANEL
Anion gap: 7 (ref 5–15)
BUN: 22 mg/dL (ref 8–23)
CO2: 32 mmol/L (ref 22–32)
Calcium: 8.5 mg/dL — ABNORMAL LOW (ref 8.9–10.3)
Chloride: 94 mmol/L — ABNORMAL LOW (ref 98–111)
Creatinine, Ser: 0.49 mg/dL (ref 0.44–1.00)
GFR, Estimated: >60 mL/min (ref >60)
Glucose, Bld: 90 mg/dL (ref 70–99)
Potassium: 3.6 mmol/L (ref 3.5–5.1)
Sodium: 133 mmol/L — ABNORMAL LOW (ref 135–145)

## 2023-04-28 LAB — CBC WITH DIFFERENTIAL/PLATELET
Abs Immature Granulocytes: 0.05 10*3/uL (ref 0.00–0.07)
Basophils Absolute: 0 10*3/uL (ref 0.0–0.1)
Basophils Relative: 0 %
Eosinophils Absolute: 0 10*3/uL (ref 0.0–0.5)
Eosinophils Relative: 0 %
HCT: 42.3 % (ref 36.0–46.0)
Hemoglobin: 13.3 g/dL (ref 12.0–15.0)
Immature Granulocytes: 0 %
Lymphocytes Relative: 28 %
Lymphs Abs: 3.4 10*3/uL (ref 0.7–4.0)
MCH: 28.3 pg (ref 26.0–34.0)
MCHC: 31.4 g/dL (ref 30.0–36.0)
MCV: 90 fL (ref 80.0–100.0)
Monocytes Absolute: 0.8 10*3/uL (ref 0.1–1.0)
Monocytes Relative: 6 %
Neutro Abs: 7.9 10*3/uL — ABNORMAL HIGH (ref 1.7–7.7)
Neutrophils Relative %: 66 %
Platelets: 203 10*3/uL (ref 150–400)
RBC: 4.7 MIL/uL (ref 3.87–5.11)
RDW: 14.6 % (ref 11.5–15.5)
WBC: 12.1 10*3/uL — ABNORMAL HIGH (ref 4.0–10.5)
nRBC: 0 % (ref 0.0–0.2)

## 2023-04-28 LAB — TROPONIN I (HIGH SENSITIVITY)
Troponin I (High Sensitivity): 12 ng/L (ref <18)
Troponin I (High Sensitivity): 10 ng/L (ref <18)

## 2023-04-28 MED ORDER — ROSUVASTATIN CALCIUM 20 MG PO TABS
20.0000 mg | ORAL_TABLET | Freq: Every day | ORAL | Status: DC
  Administered 2023-04-28 – 2023-05-01 (×4): 20 mg via ORAL
  Filled 2023-04-28 (×3): qty 2
  Filled 2023-04-28: qty 1
  Filled 2023-04-28: qty 2
  Filled 2023-04-28 (×3): qty 1

## 2023-04-28 MED ORDER — ACETAMINOPHEN 325 MG PO TABS: 650.0000 mg | ORAL_TABLET | Freq: Four times a day (QID) | ORAL | Status: DC | PRN

## 2023-04-28 MED ORDER — ONDANSETRON HCL 4 MG/2ML IJ SOLN: 4.0000 mg | Freq: Four times a day (QID) | INTRAMUSCULAR | Status: DC | PRN

## 2023-04-28 MED ORDER — TIZANIDINE HCL 2 MG PO TABS
2.0000 mg | ORAL_TABLET | Freq: Once | ORAL | Status: AC
  Administered 2023-04-28: 2 mg via ORAL
  Filled 2023-04-28: qty 1

## 2023-04-28 MED ORDER — PANTOPRAZOLE SODIUM 40 MG PO TBEC
40.0000 mg | DELAYED_RELEASE_TABLET | Freq: Every day | ORAL | Status: DC
  Administered 2023-04-28 – 2023-05-01 (×4): 40 mg via ORAL
  Filled 2023-04-28 (×4): qty 1

## 2023-04-28 MED ORDER — IPRATROPIUM-ALBUTEROL 0.5-2.5 (3) MG/3ML IN SOLN
3.0000 mL | Freq: Two times a day (BID) | RESPIRATORY_TRACT | Status: DC
  Administered 2023-04-28 – 2023-04-30 (×4): 3 mL via RESPIRATORY_TRACT
  Filled 2023-04-28 (×4): qty 3

## 2023-04-28 MED ORDER — ACETAMINOPHEN 650 MG RE SUPP: 650.0000 mg | Freq: Four times a day (QID) | RECTAL | Status: DC | PRN

## 2023-04-28 MED ORDER — ALBUTEROL SULFATE (2.5 MG/3ML) 0.083% IN NEBU
10.0000 mg/h | INHALATION_SOLUTION | Freq: Once | RESPIRATORY_TRACT | Status: AC
  Administered 2023-04-28: 10 mg/h via RESPIRATORY_TRACT
  Filled 2023-04-28: qty 12

## 2023-04-28 MED ORDER — IPRATROPIUM BROMIDE 0.02 % IN SOLN
0.5000 mg | Freq: Once | RESPIRATORY_TRACT | Status: AC
  Administered 2023-04-28: 0.5 mg via RESPIRATORY_TRACT
  Filled 2023-04-28: qty 2.5

## 2023-04-28 MED ORDER — POLYETHYLENE GLYCOL 3350 17 G PO PACK: 17.0000 g | PACK | Freq: Every day | ORAL | Status: DC | PRN

## 2023-04-28 MED ORDER — ONDANSETRON HCL 4 MG PO TABS: 4.0000 mg | ORAL_TABLET | Freq: Four times a day (QID) | ORAL | Status: DC | PRN

## 2023-04-28 MED ORDER — METOPROLOL SUCCINATE ER 25 MG PO TB24
12.5000 mg | ORAL_TABLET | Freq: Every day | ORAL | Status: DC
  Administered 2023-04-28 – 2023-05-01 (×4): 12.5 mg via ORAL
  Filled 2023-04-28 (×4): qty 1

## 2023-04-28 MED ORDER — ALBUTEROL SULFATE (2.5 MG/3ML) 0.083% IN NEBU: 2.5000 mg | INHALATION_SOLUTION | RESPIRATORY_TRACT | Status: DC | PRN

## 2023-04-28 MED ORDER — TAMSULOSIN HCL 0.4 MG PO CAPS
0.4000 mg | ORAL_CAPSULE | Freq: Every day | ORAL | Status: DC
  Administered 2023-04-28 – 2023-05-01 (×4): 0.4 mg via ORAL
  Filled 2023-04-28 (×4): qty 1

## 2023-04-28 MED ORDER — IPRATROPIUM-ALBUTEROL 0.5-2.5 (3) MG/3ML IN SOLN: 3.0000 mL | Freq: Four times a day (QID) | RESPIRATORY_TRACT | Status: DC

## 2023-04-28 MED ORDER — APIXABAN 5 MG PO TABS
5.0000 mg | ORAL_TABLET | Freq: Two times a day (BID) | ORAL | Status: DC
  Administered 2023-04-28 – 2023-05-01 (×7): 5 mg via ORAL
  Filled 2023-04-28 (×7): qty 1

## 2023-04-28 MED ORDER — LEVOTHYROXINE SODIUM 25 MCG PO TABS: 125.0000 ug | ORAL_TABLET | Freq: Every day | ORAL | Status: DC

## 2023-04-28 MED ORDER — IPRATROPIUM-ALBUTEROL 0.5-2.5 (3) MG/3ML IN SOLN
3.0000 mL | Freq: Once | RESPIRATORY_TRACT | Status: AC
  Administered 2023-04-28: 3 mL via RESPIRATORY_TRACT
  Filled 2023-04-28: qty 3

## 2023-04-28 MED ORDER — METHYLPREDNISOLONE SODIUM SUCC 40 MG IJ SOLR
40.0000 mg | Freq: Two times a day (BID) | INTRAMUSCULAR | Status: DC
  Administered 2023-04-28 – 2023-04-29 (×4): 40 mg via INTRAVENOUS
  Filled 2023-04-28 (×4): qty 1

## 2023-04-28 MED ORDER — TRAZODONE HCL 50 MG PO TABS: 25.0000 mg | ORAL_TABLET | Freq: Every evening | ORAL | Status: DC | PRN

## 2023-04-28 MED ORDER — LEVOTHYROXINE SODIUM 25 MCG PO TABS
125.0000 ug | ORAL_TABLET | Freq: Every day | ORAL | Status: DC
  Administered 2023-04-28 – 2023-05-01 (×4): 125 ug via ORAL
  Filled 2023-04-28 (×4): qty 1

## 2023-04-28 MED ORDER — METHYLPREDNISOLONE SODIUM SUCC 125 MG IJ SOLR
125.0000 mg | Freq: Once | INTRAMUSCULAR | Status: AC
  Administered 2023-04-28: 125 mg via INTRAVENOUS
  Filled 2023-04-28: qty 2

## 2023-04-28 MED ORDER — MAGNESIUM SULFATE 2 GM/50ML IV SOLN
2.0000 g | Freq: Once | INTRAVENOUS | Status: AC
  Administered 2023-04-28: 2 g via INTRAVENOUS
  Filled 2023-04-28: qty 50

## 2023-04-28 MED ORDER — MAGNESIUM OXIDE 400 MG PO CAPS: 400.0000 mg | ORAL_CAPSULE | Freq: Every morning | ORAL | Status: DC

## 2023-04-28 MED ORDER — ALPRAZOLAM 0.5 MG PO TABS
0.5000 mg | ORAL_TABLET | Freq: Two times a day (BID) | ORAL | Status: DC | PRN
  Administered 2023-04-28 – 2023-05-01 (×5): 0.5 mg via ORAL
  Filled 2023-04-28 (×5): qty 1

## 2023-04-28 NOTE — ED Notes (Signed)
ED TO INPATIENT HANDOFF REPORT  Name/Age/Gender Tracy Park 67 y.o. female  Code Status    Code Status Orders  (From admission, onward)           Start     Ordered   04/28/23 0741  Do not attempt resuscitation (DNR)  Continuous       Question Answer Comment  If patient has no pulse and is not breathing Do Not Attempt Resuscitation   If patient has a pulse and/or is breathing: Medical Treatment Goals LIMITED ADDITIONAL INTERVENTIONS: Use medication/IV fluids and cardiac monitoring as indicated; Do not use intubation or mechanical ventilation (DNI), also provide comfort medications.  Transfer to Progressive/Stepdown as indicated, avoid Intensive Care.   Consent: Discussion documented in EHR or advanced directives reviewed      04/28/23 0740           Code Status History     Date Active Date Inactive Code Status Order ID Comments User Context   04/17/2023 0151 04/18/2023 1944 DNR [id number]  Anselm Jungling, DO Inpatient   04/11/2023 2132 04/14/2023 0035 Full Code [id number]  Hugelmeyer, [place], DO ED   03/26/2023 0938 03/27/2023 1838 DNR [id number]  Bobette Mo, MD Inpatient   03/06/2023 2249 03/10/2023 2107 DNR [id number]  Angie Fava, DO ED   02/23/2023 2046 02/26/2023 2245 DNR [id number]  Briscoe Deutscher, MD ED   01/25/2023 2350 01/28/2023 1928 DNR [id number]  Anselm Jungling, DO Inpatient   01/08/2023 0235 01/13/2023 1928 DNR [id number]  Hillary Bow, DO Inpatient   01/08/2023 0117 01/08/2023 0235 Full Code [id number]  Hillary Bow, DO ED   03/24/2022 1159 03/28/2022 1546 Full Code [id number]  Bobette Mo, MD ED   03/14/2022 2132 03/16/2022 1602 Full Code [id number]  Karl Ito, MD Inpatient   03/14/2022 0331 03/14/2022 2132 DNR [id number]  Tomma Lightning, MD ED   03/13/2022 2149 03/14/2022 0331 DNR [id number]  Mansy, Vernetta Honey, MD ED   02/26/2022 0200 03/01/2022 1555 DNR [id number]  Anselm Jungling, DO ED   08/16/2021 2316 08/25/2021 2324 DNR [id number]  John Giovanni, MD ED   08/16/2021  2253 08/16/2021 2316 Full Code [id number]  John Giovanni, MD ED   05/03/2021 0052 05/05/2021 1756 Full Code [id number]  Eduard Clos, MD ED   04/10/2020 0342 04/12/2020 2137 Full Code [id number]  Eduard Clos, MD ED   04/28/2012 [phone] 04/28/2012 2047 Full Code [id number]  Kelby Aline, RN Inpatient       Home/SNF/Other Home  Chief Complaint COPD with acute exacerbation [facility]) [J44.1]  Level of Care/Admitting Diagnosis ED Disposition     ED Disposition  Admit   Condition  --   Comment  Hospital Area: [facility] [[id number]]  Level of Care: Med-Surg [16]  May admit patient to [facility] or [facility] if equivalent level of care is available:: Yes  Covid Evaluation: Asymptomatic - no recent exposure (last 10 days) testing not required  Diagnosis: COPD with acute exacerbation [facility]) [[id number]]  Admitting Physician: Maryln Gottron [[id number]]  Attending Physician: Kirby Crigler, MIR [email or web address] [[id number]]  Certification:: I certify this patient will need inpatient services for at least 2 midnights  Expected Medical Readiness: 04/30/2023          Medical History Past Medical History:  Diagnosis Date   Anginal pain (HCC)    Anxiety    Bipolar disorder (HCC)    Cancer (HCC)    COPD (chronic obstructive pulmonary disease) (HCC)  Dyspnea    Family history of adverse reaction to anesthesia    History of kidney stones    Hydroureteronephrosis 08/16/2021   Hypothyroidism    Lung cancer (HCC)    Myocardial infarction (HCC)    Paroxysmal atrial fibrillation (HCC)    PTSD (post-traumatic stress disorder)    Sleep apnea    Thyroid disease     Allergies Allergies  Allergen Reactions   Red Dye #40 (Allura Red) Hives, Itching and Other (See Comments)    Red food dye   Oxycontin [Oxycodone Hcl] Other (See Comments)    Hallucinations    Prednisone Hives and Nausea And Vomiting   Aspirin Hives   Strawberry Extract Hives and Itching   Tomato Hives and  Itching   Tape Rash and Other (See Comments)    Prefers paper tape   Wound Dressing Adhesive Rash    IV Location/Drains/Wounds Patient Lines/Drains/Airways Status     Active Line/Drains/Airways     Name Placement date Placement time Site Days   Peripheral IV 04/28/23 20 G 1" Anterior;Left Forearm 04/28/23  0238  Forearm  less than 1            Labs/Imaging Results for orders placed or performed during the hospital encounter of 04/28/23 (from the past 48 hour(s))  CBC with Differential     Status: Abnormal   Collection Time: 04/28/23  2:49 AM  Result Value Ref Range   WBC 12.1 (H) 4.0 - 10.5 K/uL   RBC 4.70 3.87 - 5.11 MIL/uL   Hemoglobin 13.3 12.0 - 15.0 g/dL   HCT 78.2 95.6 - 21.3 %   MCV 90.0 80.0 - 100.0 fL   MCH 28.3 26.0 - 34.0 pg   MCHC 31.4 30.0 - 36.0 g/dL   RDW 08.6 57.8 - 46.9 %   Platelets 203 150 - 400 K/uL   nRBC 0.0 0.0 - 0.2 %   Neutrophils Relative % 66 %   Neutro Abs 7.9 (H) 1.7 - 7.7 K/uL   Lymphocytes Relative 28 %   Lymphs Abs 3.4 0.7 - 4.0 K/uL   Monocytes Relative 6 %   Monocytes Absolute 0.8 0.1 - 1.0 K/uL   Eosinophils Relative 0 %   Eosinophils Absolute 0.0 0.0 - 0.5 K/uL   Basophils Relative 0 %   Basophils Absolute 0.0 0.0 - 0.1 K/uL   Immature Granulocytes 0 %   Abs Immature Granulocytes 0.05 0.00 - 0.07 K/uL    Comment: Performed at [facility], [address], [place], [place] [address]  Basic metabolic panel     Status: Abnormal   Collection Time: 04/28/23  2:49 AM  Result Value Ref Range   Sodium 133 (L) 135 - 145 mmol/L   Potassium 3.6 3.5 - 5.1 mmol/L   Chloride 94 (L) 98 - 111 mmol/L   CO2 32 22 - 32 mmol/L   Glucose, Bld 90 70 - 99 mg/dL    Comment: Glucose reference range applies only to samples taken after fasting for at least 8 hours.   BUN 22 8 - 23 mg/dL   Creatinine, Ser 8.41 0.44 - 1.00 mg/dL   Calcium 8.5 (L) 8.9 - 10.3 mg/dL   GFR, Estimated >32 >44 mL/min    Comment: (NOTE) Calculated using  the CKD-EPI Creatinine Equation (2021)    Anion gap 7 5 - 15    Comment: Performed at [facility], [address] [place], [place] [address]  Troponin I (High Sensitivity)  Status: None   Collection Time: 04/28/23  2:49 AM  Result Value Ref Range   Troponin I (High Sensitivity) 12 <18 ng/L    Comment: (NOTE) Elevated high sensitivity troponin I (hsTnI) values and significant  changes across serial measurements may suggest ACS but many other  chronic and acute conditions are known to elevate hsTnI results.  Refer to the "Links" section for chest pain algorithms and additional  guidance. Performed at [facility], [address], [place], [place] [address]   Troponin I (High Sensitivity)     Status: None   Collection Time: 04/28/23  4:01 AM  Result Value Ref Range   Troponin I (High Sensitivity) 10 <18 ng/L    Comment: (NOTE) Elevated high sensitivity troponin I (hsTnI) values and significant  changes across serial measurements may suggest ACS but many other  chronic and acute conditions are known to elevate hsTnI results.  Refer to the "Links" section for chest pain algorithms and additional  guidance. Performed at [facility], [address], [place], [place] [address]    DG Chest Portable 1 View  Result Date: 04/28/2023 CLINICAL DATA:  Dyspnea EXAM: PORTABLE CHEST 1 VIEW COMPARISON:  04/26/2023, CT 03/08/2023 FINDINGS: Surgical changes of left upper lobectomy with associated left-sided volume loss and mild elevation of the left hemidiaphragm is again noted. Stable left apical pleural thickening. The right lung appears stably mildly hyperinflated in keeping with changes of underlying COPD. No pneumothorax or pleural effusion. No confluent pulmonary infiltrate. Cardiac size within normal limits. Pulmonary vascularity is normal. No acute bone abnormality. IMPRESSION: 1. No active disease. 2. COPD. 3. Surgical changes of  left upper lobectomy. Electronically Signed   By: Helyn Numbers M.D.   On: 04/28/2023 02:29    Pending Labs Unresulted Labs (From admission, onward)     Start     Ordered   04/29/23 0500  Basic metabolic panel  Tomorrow morning,   R        04/28/23 0740   04/29/23 0500  CBC  Tomorrow morning,   R        04/28/23 0740            Vitals/Pain Today's Vitals   04/28/23 0215 04/28/23 0406 04/28/23 0515 04/28/23 0645  BP: 136/88  129/71 106/64  Pulse: 72  70 80  Resp: 20  (!) 26 (!) 21  Temp:   98.7 F (37.1 C)   TempSrc:   Axillary   SpO2: 98% 100% 100% 96%    Isolation Precautions No active isolations  Medications Medications  metoprolol succinate (TOPROL-XL) 24 hr tablet 12.5 mg (has no administration in time range)  rosuvastatin (CRESTOR) tablet 20 mg (has no administration in time range)  ALPRAZolam (XANAX) tablet 0.5 mg (has no administration in time range)  levothyroxine (SYNTHROID) tablet 125 mcg (has no administration in time range)  pantoprazole (PROTONIX) EC tablet 40 mg (has no administration in time range)  Magnesium Oxide CAPS 400 mg (has no administration in time range)  tamsulosin (FLOMAX) capsule 0.4 mg (has no administration in time range)  apixaban (ELIQUIS) tablet 5 mg (has no administration in time range)  ipratropium-albuterol (DUONEB) 0.5-2.5 (3) MG/3ML nebulizer solution 3 mL (has no administration in time range)  acetaminophen (TYLENOL) tablet 650 mg (has no administration in time range)    Or  acetaminophen (TYLENOL) suppository 650 mg (has no administration in time range)  traZODone (DESYREL) tablet 25 mg (has no administration in time range)  ondansetron (  ZOFRAN) tablet 4 mg (has no administration in time range)    Or  ondansetron (ZOFRAN) injection 4 mg (has no administration in time range)  polyethylene glycol (MIRALAX / GLYCOLAX) packet 17 g (has no administration in time range)  methylPREDNISolone sodium succinate (SOLU-MEDROL) 40 mg/mL  injection 40 mg (has no administration in time range)  albuterol (PROVENTIL) (2.5 MG/3ML) 0.083% nebulizer solution 2.5 mg (has no administration in time range)  ipratropium-albuterol (DUONEB) 0.5-2.5 (3) MG/3ML nebulizer solution 3 mL (3 mLs Nebulization Given 04/28/23 0243)  methylPREDNISolone sodium succinate (SOLU-MEDROL) 125 mg/2 mL injection 125 mg (125 mg Intravenous Given 04/28/23 0238)  magnesium sulfate IVPB 2 g 50 mL (0 g Intravenous Stopped 04/28/23 0337)  ipratropium-albuterol (DUONEB) 0.5-2.5 (3) MG/3ML nebulizer solution 3 mL (3 mLs Nebulization Given 04/28/23 0340)  albuterol (PROVENTIL) (2.5 MG/3ML) 0.083% nebulizer solution (10 mg/hr Nebulization Given 04/28/23 0406)  ipratropium (ATROVENT) nebulizer solution 0.5 mg (0.5 mg Nebulization Given 04/28/23 0406)    Mobility walks

## 2023-04-28 NOTE — Plan of Care (Signed)

## 2023-04-28 NOTE — Plan of Care (Signed)
  Problem: Education: Goal: Knowledge of General Education information will improve Description: Including pain rating scale, medication(s)/side effects and non-pharmacologic comfort measures 04/28/2023 1901 by Leonia Corona, RN Outcome: Progressing 04/28/2023 1900 by Leonia Corona, RN Outcome: Progressing   Problem: Health Behavior/Discharge Planning: Goal: Ability to manage health-related needs will improve 04/28/2023 1901 by Leonia Corona, RN Outcome: Progressing 04/28/2023 1900 by Leonia Corona, RN Outcome: Progressing   Problem: Clinical Measurements: Goal: Ability to maintain clinical measurements within normal limits will improve 04/28/2023 1901 by Leonia Corona, RN Outcome: Progressing 04/28/2023 1900 by Leonia Corona, RN Outcome: Progressing Goal: Will remain free from infection 04/28/2023 1901 by Leonia Corona, RN Outcome: Progressing 04/28/2023 1900 by Leonia Corona, RN Outcome: Progressing Goal: Diagnostic test results will improve 04/28/2023 1901 by Leonia Corona, RN Outcome: Progressing 04/28/2023 1900 by Leonia Corona, RN Outcome: Progressing Goal: Respiratory complications will improve 04/28/2023 1901 by Leonia Corona, RN Outcome: Progressing 04/28/2023 1900 by Leonia Corona, RN Outcome: Progressing Goal: Cardiovascular complication will be avoided 04/28/2023 1901 by Leonia Corona, RN Outcome: Progressing 04/28/2023 1900 by Leonia Corona, RN Outcome: Progressing   Problem: Activity: Goal: Risk for activity intolerance will decrease 04/28/2023 1901 by Leonia Corona, RN Outcome: Progressing 04/28/2023 1900 by Leonia Corona, RN Outcome: Progressing   Problem: Nutrition: Goal: Adequate nutrition will be maintained 04/28/2023 1901 by Leonia Corona, RN Outcome: Progressing 04/28/2023 1900 by Leonia Corona, RN Outcome: Progressing   Problem: Coping: Goal: Level of anxiety will decrease 04/28/2023 1901 by Leonia Corona, RN Outcome:  Progressing 04/28/2023 1900 by Leonia Corona, RN Outcome: Progressing   Problem: Elimination: Goal: Will not experience complications related to bowel motility 04/28/2023 1901 by Leonia Corona, RN Outcome: Progressing 04/28/2023 1900 by Leonia Corona, RN Outcome: Progressing Goal: Will not experience complications related to urinary retention 04/28/2023 1901 by Leonia Corona, RN Outcome: Progressing 04/28/2023 1900 by Leonia Corona, RN Outcome: Progressing   Problem: Pain Managment: Goal: General experience of comfort will improve 04/28/2023 1901 by Leonia Corona, RN Outcome: Progressing 04/28/2023 1900 by Leonia Corona, RN Outcome: Progressing   Problem: Safety: Goal: Ability to remain free from injury will improve 04/28/2023 1901 by Leonia Corona, RN Outcome: Progressing 04/28/2023 1900 by Leonia Corona, RN Outcome: Progressing   Problem: Skin Integrity: Goal: Risk for impaired skin integrity will decrease 04/28/2023 1901 by Leonia Corona, RN Outcome: Progressing 04/28/2023 1900 by Leonia Corona, RN Outcome: Progressing

## 2023-04-28 NOTE — ED Triage Notes (Signed)
Pt BIB [facility] from Home. Pt c/o shortness of breath. Pt Hx COPD and wears 2L [place] at home. Pt also c/o cough. Pt refusing meds from ems.  138/90 72 HR 100% 2L [place]

## 2023-04-28 NOTE — H&P (Signed)
History and Physical  Tracy Park AVW:[id number] DOB: 1956-06-09 DOA: 04/28/2023  PCP: Jacquelin Hawking, PA-C   Chief Complaint: shortness of breath   HPI: Tracy Park is a 67 y.o. female with medical history significant for paroxysmal atrial fibrillation on Eliquis, hypothyroidism, ongoing tobacco abuse, COPD with multiple recent hospital admissions being admitted to the hospital with recurrent COPD exacerbation.  She was just admitted to [facility] long hospital for COPD exacerbation 8/2 to 8/4, and again 8/7 to 8/9 and completed a course of IV cefepime/p.o. Levaquin after her last admission.  Patient states that after returning home, she has been doing well overall, couple of days ago she came to the emergency department for cough and shortness of breath, received treatment with steroids, breathing treatments, magnesium felt better and went home.  She tells me that yesterday she had recurrence of nonproductive cough, shortness of breath, feeling very anxious and unable to catch her breath.  Could feel herself wheezing.  She typically wears 2 L nasal cannula oxygen and has been continuing this.  Unfortunately she still continues to smoke, but says in the last 3 days she probably only smoked about 6 cigarettes which is a significant improvement.  She denies any chest pain, nausea, fevers, productive cough in the last 48 hours.  ED Course: She called EMS, but refused breathing treatment with them as it makes her feel jittery.  On arrival here to the emergency department, she was afebrile, blood pressure 138/90, heart rate 72, saturating 100% on 2 L nasal cannula oxygen.  He was given DuoNeb, IV Solu-Medrol 125 mg, IV magnesium.  She has remained on her baseline 2 L nasal cannula oxygen, feels that she is breathing a little better.  Review of Systems: Please see HPI for pertinent positives and negatives. A complete 10 system review of systems are otherwise negative.  Past Medical History:   Diagnosis Date   Anginal pain (HCC)    Anxiety    Bipolar disorder (HCC)    Cancer (HCC)    COPD (chronic obstructive pulmonary disease) (HCC)    Dyspnea    Family history of adverse reaction to anesthesia    History of kidney stones    Hydroureteronephrosis 08/16/2021   Hypothyroidism    Lung cancer (HCC)    Myocardial infarction (HCC)    Paroxysmal atrial fibrillation (HCC)    PTSD (post-traumatic stress disorder)    Sleep apnea    Thyroid disease    Past Surgical History:  Procedure Laterality Date   ABDOMINAL HYSTERECTOMY     BACK SURGERY     CYSTOSCOPY W/ URETERAL STENT PLACEMENT Right 05/03/2021   Procedure: CYSTOSCOPY WITH RETROGRADE PYELOGRAM/URETERAL STENT PLACEMENT;  Surgeon: Crist Fat, MD;  Location: [facility] ORS;  Service: Urology;  Laterality: Right;   EYE SURGERY     kidney stent     thyroidectomy      Social History:  reports that she has been smoking cigarettes. She has never used smokeless tobacco. She reports that she does not currently use alcohol. She reports that she does not currently use drugs.   Allergies  Allergen Reactions   Red Dye #40 (Allura Red) Hives, Itching and Other (See Comments)    Red food dye   Oxycontin [Oxycodone Hcl] Other (See Comments)    Hallucinations    Prednisone Hives and Nausea And Vomiting   Aspirin Hives   Strawberry Extract Hives and Itching   Tomato Hives and Itching   Tape Rash and Other (See  Comments)    Prefers paper tape   Wound Dressing Adhesive Rash    Family History  Problem Relation Age of Onset   Hypertension Mother    Heart failure Mother    Hypertension Father    Diabetes Father    Heart failure Father      Prior to Admission medications   Medication Sig Start Date End Date Taking? Authorizing Provider  acetaminophen (TYLENOL) 500 MG tablet Take 500 mg by mouth every 6 (six) hours as needed for moderate pain.    [provider]  albuterol (PROVENTIL) (2.5 MG/3ML) 0.083% nebulizer  solution Take 3 mLs (2.5 mg total) by nebulization every 6 (six) hours as needed for wheezing or shortness of breath. Patient taking differently: Take 2.5 mg by nebulization See admin instructions. Nebulize 2.5 mg and inhale into the lungs in the morning and at bedtime 01/30/23   Tilden Fossa, MD  albuterol (VENTOLIN HFA) 108 (90 Base) MCG/ACT inhaler Inhale 2 puffs into the lungs every 6 (six) hours as needed for wheezing or shortness of breath. 01/13/23   Burnadette Pop, MD  ALPRAZolam Prudy Feeler) 0.5 MG tablet Take 0.5 mg by mouth 2 (two) times daily as needed for anxiety. 03/30/23   [provider]  apixaban (ELIQUIS) 5 MG TABS tablet Take 5 mg by mouth 2 (two) times daily.    [provider]  Budeson-Glycopyrrol-Formoterol (BREZTRI AEROSPHERE) 160-9-4.8 MCG/ACT AERO Inhale 1 puff into the lungs in the morning and at bedtime.    [provider]  dextromethorphan-guaiFENesin (MUCINEX DM) 30-600 MG 12hr tablet Take 1 tablet by mouth 2 (two) times daily for 14 days. 04/18/23 05/02/23  Lorin Glass, MD  levothyroxine (SYNTHROID) 125 MCG tablet Take 125 mcg by mouth daily before breakfast.    [provider]  losartan (COZAAR) 25 MG tablet Take 12.5 mg by mouth daily.    [provider]  Magnesium Oxide 400 MG CAPS Take 1 capsule (400 mg total) by mouth daily at 12 noon. Patient taking differently: Take 400 mg by mouth in the morning. 03/01/22   Albertine Grates, MD  metoprolol succinate (TOPROL-XL) 25 MG 24 hr tablet Take 12.5 mg by mouth daily.    [provider]  nitroGLYCERIN (NITROSTAT) 0.4 MG SL tablet Place 0.4 mg under the tongue every 5 (five) minutes as needed for chest pain.    [provider]  OXYGEN Inhale 2 L/min into the lungs continuous.    [provider]  pantoprazole (PROTONIX) 40 MG tablet Take 40 mg by mouth daily before breakfast.    [provider]  predniSONE (DELTASONE) 10 MG tablet Take 6 tablets by mouth twice  a day for 2 days THEN take 4 tablets twice a day for 2 days THEN take 4 tablets daily for 2 days THEN take 3 tablets daily for 2 days THEN take 2 tablets daily for 2 days THEN take 1 tablet daily for 2 days 04/18/23   Lorin Glass, MD  rosuvastatin (CRESTOR) 20 MG tablet Take 20 mg by mouth daily.    [provider]  SYSTANE COMPLETE PF 0.6 % SOLN Place 1 drop into both eyes 3 (three) times daily as needed (for dryness).    [provider]  tamsulosin (FLOMAX) 0.4 MG CAPS capsule Take 0.4 mg by mouth daily.    [provider]    Physical Exam: BP 106/64   Pulse 80   Temp 98.7 F (37.1 C) (Axillary)   Resp (!) 21  SpO2 96%   General:  Alert, oriented, calm, thin appearing, looks chronically ill but no acute distress Eyes: EOMI, clear conjuctivae, white sclerea Neck: supple, no masses, trachea mildline  Cardiovascular: RRR, no murmurs or rubs, no peripheral edema  Respiratory: She actually has had bilateral equal air movement, with some mild diffuse rhonchi, and diffuse end expiratory wheezing, she is mildly tachypneic, but no retractions Abdomen: soft, nontender, nondistended, normal bowel tones heard  Skin: dry, no rashes  Musculoskeletal: no joint effusions, normal range of motion  Psychiatric: appropriate affect, normal speech  Neurologic: extraocular muscles intact, clear speech, moving all extremities with intact sensorium          Labs on Admission:  Basic Metabolic Panel: Recent Labs  Lab 04/26/23 0251 04/28/23 0249  NA 138 133*  K 3.5 3.6  CL 96* 94*  CO2 33* 32  GLUCOSE 78 90  BUN 18 22  CREATININE 0.51 0.49  CALCIUM 8.6* 8.5*   Liver Function Tests: No results for input(s): "AST", "ALT", "ALKPHOS", "BILITOT", "PROT", "ALBUMIN" in the last 168 hours. No results for input(s): "LIPASE", "AMYLASE" in the last 168 hours. No results for input(s): "AMMONIA" in the last 168 hours. CBC: Recent Labs  Lab 04/26/23 0251 04/28/23 0249  WBC  12.2* 12.1*  NEUTROABS 9.7* 7.9*  HGB 12.5 13.3  HCT 40.1 42.3  MCV 92.6 90.0  PLT 181 203   Cardiac Enzymes: No results for input(s): "CKTOTAL", "CKMB", "CKMBINDEX", "TROPONINI" in the last 168 hours.  BNP (last 3 results) Recent Labs    03/03/23 1818 03/06/23 1938 04/16/23 2048  BNP 76.4 54.6 114.4*    ProBNP (last 3 results) No results for input(s): "PROBNP" in the last 8760 hours.  CBG: Recent Labs  Lab 04/26/23 0137  GLUCAP 77    Radiological Exams on Admission: DG Chest Portable 1 View  Result Date: 04/28/2023 CLINICAL DATA:  Dyspnea EXAM: PORTABLE CHEST 1 VIEW COMPARISON:  04/26/2023, CT 03/08/2023 FINDINGS: Surgical changes of left upper lobectomy with associated left-sided volume loss and mild elevation of the left hemidiaphragm is again noted. Stable left apical pleural thickening. The right lung appears stably mildly hyperinflated in keeping with changes of underlying COPD. No pneumothorax or pleural effusion. No confluent pulmonary infiltrate. Cardiac size within normal limits. Pulmonary vascularity is normal. No acute bone abnormality. IMPRESSION: 1. No active disease. 2. COPD. 3. Surgical changes of left upper lobectomy. Electronically Signed   By: Helyn Numbers M.D.   On: 04/28/2023 02:29    Assessment/Plan This is a pleasant 67 year old female with a history of ongoing tobacco abuse, hypertension, hypothyroidism, paroxysmal atrial fibrillation on Eliquis, COPD chronically on 2 L nasal cannula oxygen being admitted to the hospital for the third time in 3 weeks for acute exacerbation of COPD.  Sounds like she may have been declining breathing treatments that home health nursing was instructed to administer, and that could be contributing to her exacerbation.  Acute exacerbation of COPD-with increased cough, dyspnea with exertion, shortness of breath at rest.  No increased oxygen requirement, but dyspnea and increased wheezing.  No evidence of acute bacterial  infection. -Inpatient admission -Continue supplemental oxygen -Scheduled DuoNebs, as needed albuterol inhaler -Given IV Solu-Medrol 125 mg in the ER, continue with IV Solu-Medrol 40 mg IV every 12 hours -Incentive spirometry -Flutter valve  Hypertension-blood pressure on the low side we will hold her losartan for now  Tobacco abuse-this is the most underlying issue which has the potential to improve her life expectancy.  10  minutes were spent counseling the patient to quit tobacco abuse.  She is certainly motivated, and working hard to quit.  Down to only a couple of cigarettes in the last couple of days.  Declines nicotine patch at this time.  Paroxysmal atrial fibrillation-continue metoprolol and Eliquis  Leukocytosis-minimal, likely due to recent steroids  Anxiety-likely also playing a role in her dyspnea, continue Xanax 0.5 mg twice daily as needed  Hypothyroidism-Synthroid  Hyperlipidemia-Crestor  GERD-Protonix  DVT prophylaxis: Eliquis  Patient confirms her CODE STATUS is DNR. We did have a conversation about this, and she demonstrates understanding.  Consults called: None  Admission status: The appropriate patient status for this patient is INPATIENT. Inpatient status is judged to be reasonable and necessary in order to provide the required intensity of service to ensure the patient's safety. The patient's presenting symptoms, physical exam findings, and initial radiographic and laboratory data in the context of their chronic comorbidities is felt to place them at high risk for further clinical deterioration. Furthermore, it is not anticipated that the patient will be medically stable for discharge from the hospital within 2 midnights of admission.    I certify that at the point of admission it is my clinical judgment that the patient will require inpatient hospital care spanning beyond 2 midnights from the point of admission due to high intensity of service, high risk for  further deterioration and high frequency of surveillance required  Time spent: 53 minutes  Rondell Frick Sharlette Dense MD [facility] Pager [phone]  If 7PM-7AM, please contact night-coverage [email or web address] Password [email or web address]  04/28/2023, 7:22 AM

## 2023-04-28 NOTE — ED Provider Notes (Signed)
[place] EMERGENCY DEPARTMENT AT [facility] Provider Note   CSN: [id number] Arrival date & time: 04/28/23  0145     History  Chief Complaint  Patient presents with   Shortness of Breath    Tracy Park is a 67 y.o. female.  history of hypertension, hyperlipidemia, COPD with chronic oxygen dependence, lung cancer status post left upper lobectomy, paroxysmal atrial fibrillation anticoagulated on apixaban and comes in with cough and worsening shortness of breath presents with worsening shortness of breath since this evening and afternoon.  She was seen in the ED 2 days ago for same.  She was also hospitalized from August 7 to 9 with a COPD exacerbation and treated with a course of Levaquin and prednisone.  She was given a dose of Decadron when she was in the ED 2 days ago.  Her breathing has worsened since then with cough productive of clear and yellow mucus.  Denies chest pain.  Denies abdominal pain, nausea, vomiting, leg pain or leg swelling.  Did not allow EMS to give her albuterol due to feeling jittery.  She did last use of treatment at 6 PM at home.  Uses her nebulizer twice daily.  Has not had increase her home oxygen.  She has "chills" that go up and down her back but no pain in her back or chest.  No fever.  The history is provided by the patient and the EMS personnel.  Shortness of Breath Associated symptoms: cough   Associated symptoms: no abdominal pain, no chest pain, no fever, no headaches, no rash, no sore throat and no vomiting        Home Medications Prior to Admission medications   Medication Sig Start Date End Date Taking? Authorizing Provider  acetaminophen (TYLENOL) 500 MG tablet Take 500 mg by mouth every 6 (six) hours as needed for moderate pain.    [provider]  albuterol (PROVENTIL) (2.5 MG/3ML) 0.083% nebulizer solution Take 3 mLs (2.5 mg total) by nebulization every 6 (six) hours as needed for wheezing or shortness of  breath. Patient taking differently: Take 2.5 mg by nebulization See admin instructions. Nebulize 2.5 mg and inhale into the lungs in the morning and at bedtime 01/30/23   Tilden Fossa, MD  albuterol (VENTOLIN HFA) 108 (90 Base) MCG/ACT inhaler Inhale 2 puffs into the lungs every 6 (six) hours as needed for wheezing or shortness of breath. 01/13/23   Burnadette Pop, MD  ALPRAZolam Prudy Feeler) 0.5 MG tablet Take 0.5 mg by mouth 2 (two) times daily as needed for anxiety. 03/30/23   [provider]  apixaban (ELIQUIS) 5 MG TABS tablet Take 5 mg by mouth 2 (two) times daily.    [provider]  Budeson-Glycopyrrol-Formoterol (BREZTRI AEROSPHERE) 160-9-4.8 MCG/ACT AERO Inhale 1 puff into the lungs in the morning and at bedtime.    [provider]  dextromethorphan-guaiFENesin (MUCINEX DM) 30-600 MG 12hr tablet Take 1 tablet by mouth 2 (two) times daily for 14 days. 04/18/23 05/02/23  Lorin Glass, MD  levothyroxine (SYNTHROID) 125 MCG tablet Take 125 mcg by mouth daily before breakfast.    [provider]  losartan (COZAAR) 25 MG tablet Take 12.5 mg by mouth daily.    [provider]  Magnesium Oxide 400 MG CAPS Take 1 capsule (400 mg total) by mouth daily at 12 noon. Patient taking differently: Take 400 mg by mouth in the morning. 03/01/22   Albertine Grates, MD  metoprolol succinate (TOPROL-XL) 25 MG 24 hr tablet  Take 12.5 mg by mouth daily.    [provider]  nitroGLYCERIN (NITROSTAT) 0.4 MG SL tablet Place 0.4 mg under the tongue every 5 (five) minutes as needed for chest pain.    [provider]  OXYGEN Inhale 2 L/min into the lungs continuous.    [provider]  pantoprazole (PROTONIX) 40 MG tablet Take 40 mg by mouth daily before breakfast.    [provider]  predniSONE (DELTASONE) 10 MG tablet Take 6 tablets by mouth twice a day for 2 days THEN take 4 tablets twice a day for 2 days THEN take 4 tablets daily for 2 days THEN take 3  tablets daily for 2 days THEN take 2 tablets daily for 2 days THEN take 1 tablet daily for 2 days 04/18/23   Lorin Glass, MD  rosuvastatin (CRESTOR) 20 MG tablet Take 20 mg by mouth daily.    [provider]  SYSTANE COMPLETE PF 0.6 % SOLN Place 1 drop into both eyes 3 (three) times daily as needed (for dryness).    [provider]  tamsulosin (FLOMAX) 0.4 MG CAPS capsule Take 0.4 mg by mouth daily.    [provider]      Allergies    Red dye #40 (allura red), Oxycontin [oxycodone hcl], Prednisone, Aspirin, Strawberry extract, Tomato, Tape, and Wound dressing adhesive    Review of Systems   Review of Systems  Constitutional:  Positive for chills. Negative for activity change, appetite change and fever.  HENT:  Positive for congestion and rhinorrhea. Negative for sore throat.   Respiratory:  Positive for cough and shortness of breath. Negative for chest tightness.   Cardiovascular:  Negative for chest pain.  Gastrointestinal:  Negative for abdominal pain, nausea and vomiting.  Genitourinary:  Negative for dysuria and hematuria.  Musculoskeletal:  Negative for arthralgias and myalgias.  Skin:  Negative for rash.  Neurological:  Negative for dizziness, weakness and headaches.   all other systems are negative except as noted in the HPI and PMH.    Physical Exam Updated Vital Signs BP 129/71   Pulse 70   Temp 98.7 F (37.1 C) (Axillary)   Resp (!) 26   SpO2 100%  Physical Exam Vitals and nursing note reviewed.  Constitutional:      General: She is not in acute distress.    Appearance: She is well-developed.     Comments: Tachypneic, speaking in short phrases  HENT:     Head: Normocephalic and atraumatic.     Mouth/Throat:     Pharynx: No oropharyngeal exudate.  Eyes:     Conjunctiva/sclera: Conjunctivae normal.     Pupils: Pupils are equal, round, and reactive to light.  Neck:     Comments: No meningismus. Cardiovascular:     Rate and Rhythm:  Normal rate and regular rhythm.     Heart sounds: Normal heart sounds. No murmur heard. Pulmonary:     Effort: Respiratory distress present.     Breath sounds: Wheezing present.  Abdominal:     Palpations: Abdomen is soft.     Tenderness: There is no abdominal tenderness. There is no guarding or rebound.  Musculoskeletal:        General: No tenderness. Normal range of motion.     Cervical back: Normal range of motion and neck supple.  Skin:    General: Skin is warm.  Neurological:     Mental Status: She is alert and oriented to person, place, and time.  Cranial Nerves: No cranial nerve deficit.     Motor: No abnormal muscle tone.     Coordination: Coordination normal.     Comments:  5/5 strength throughout. CN 2-12 intact.Equal grip strength.   Psychiatric:        Behavior: Behavior normal.     ED Results / Procedures / Treatments   Labs (all labs ordered are listed, but only abnormal results are displayed) Labs Reviewed  CBC WITH DIFFERENTIAL/PLATELET - Abnormal; Notable for the following components:      Result Value   WBC 12.1 (*)    Neutro Abs 7.9 (*)    All other components within normal limits  BASIC METABOLIC PANEL - Abnormal; Notable for the following components:   Sodium 133 (*)    Chloride 94 (*)    Calcium 8.5 (*)    All other components within normal limits  TROPONIN I (HIGH SENSITIVITY)  TROPONIN I (HIGH SENSITIVITY)    EKG EKG Interpretation Date/Time:  Monday April 28 2023 04:24:15 EDT Ventricular Rate:  67 PR Interval:  130 QRS Duration:  113 QT Interval:  401 QTC Calculation: 424 R Axis:   75  Text Interpretation: Sinus rhythm Right atrial enlargement Incomplete right bundle branch block Probable left ventricular hypertrophy ST elevation suggests acute pericarditis No significant change was found Confirmed by Glynn Octave [phone]) on 04/28/2023 4:38:18 AM  Radiology DG Chest Portable 1 View  Result Date: 04/28/2023 CLINICAL DATA:   Dyspnea EXAM: PORTABLE CHEST 1 VIEW COMPARISON:  04/26/2023, CT 03/08/2023 FINDINGS: Surgical changes of left upper lobectomy with associated left-sided volume loss and mild elevation of the left hemidiaphragm is again noted. Stable left apical pleural thickening. The right lung appears stably mildly hyperinflated in keeping with changes of underlying COPD. No pneumothorax or pleural effusion. No confluent pulmonary infiltrate. Cardiac size within normal limits. Pulmonary vascularity is normal. No acute bone abnormality. IMPRESSION: 1. No active disease. 2. COPD. 3. Surgical changes of left upper lobectomy. Electronically Signed   By: Helyn Numbers M.D.   On: 04/28/2023 02:29    Procedures Procedures    Medications Ordered in ED Medications  ipratropium-albuterol (DUONEB) 0.5-2.5 (3) MG/3ML nebulizer solution 3 mL (has no administration in time range)  methylPREDNISolone sodium succinate (SOLU-MEDROL) 125 mg/2 mL injection 125 mg (has no administration in time range)  magnesium sulfate IVPB 2 g 50 mL (has no administration in time range)    ED Course/ Medical Decision Making/ A&P                                 Medical Decision Making Amount and/or Complexity of Data Reviewed Independent Historian: EMS Labs: ordered. Decision-making details documented in ED Course. Radiology: ordered and independent interpretation performed. Decision-making details documented in ED Course. ECG/medicine tests: ordered and independent interpretation performed. Decision-making details documented in ED Course.  Risk Prescription drug management. Decision regarding hospitalization.   Recurrent shortness of breath and wheezing in the setting of known COPD.  No hypoxia on home oxygen.  No chest pain.  Will give bronchodilators, steroids and magnesium.  Check chest x-ray.  X-ray negative for infiltrate or pneumothorax.  Results reviewed and interpreted by me.  EKG without acute ischemia. Lab show minimal  leukocytosis similar to previous.  Suspect likely COPD exacerbation causing her difficulty breathing, coughing and wheezing.  Low suspicion for pulmonary embolism given her compliance with apixaban.  After multiple rounds of nebulizers, patient remains tachypneic  and wheezing.  She is able to ambulate without desaturation or home oxygen but becomes quite tachypneic and short of breath.  She is requesting admission.  Continue steroids, bronchodilators and magnesium.  Admission discussed with Dr. Julian Reil.        Final Clinical Impression(s) / ED Diagnoses Final diagnoses:  COPD exacerbation [facility])    Rx / DC Orders ED Discharge Orders     None         Laquanna Veazey, Jeannett Senior, MD 04/28/23 [phone]

## 2023-04-29 DIAGNOSIS — J441 Chronic obstructive pulmonary disease with (acute) exacerbation: Secondary | ICD-10-CM | POA: Diagnosis not present

## 2023-04-29 LAB — BASIC METABOLIC PANEL
Anion gap: 8 (ref 5–15)
BUN: 23 mg/dL (ref 8–23)
CO2: 30 mmol/L (ref 22–32)
Calcium: 8.6 mg/dL — ABNORMAL LOW (ref 8.9–10.3)
Chloride: 97 mmol/L — ABNORMAL LOW (ref 98–111)
Creatinine, Ser: 0.55 mg/dL (ref 0.44–1.00)
GFR, Estimated: >60 mL/min (ref >60)
Glucose, Bld: 170 mg/dL — ABNORMAL HIGH (ref 70–99)
Potassium: 4 mmol/L (ref 3.5–5.1)
Sodium: 135 mmol/L (ref 135–145)

## 2023-04-29 LAB — EXPECTORATED SPUTUM ASSESSMENT W GRAM STAIN, RFLX TO RESP C

## 2023-04-29 LAB — CBC
HCT: 39 % (ref 36.0–46.0)
Hemoglobin: 12.1 g/dL (ref 12.0–15.0)
MCH: 27.9 pg (ref 26.0–34.0)
MCHC: 31 g/dL (ref 30.0–36.0)
MCV: 89.9 fL (ref 80.0–100.0)
Platelets: 190 10*3/uL (ref 150–400)
RBC: 4.34 MIL/uL (ref 3.87–5.11)
RDW: 14.5 % (ref 11.5–15.5)
WBC: 11.9 10*3/uL — ABNORMAL HIGH (ref 4.0–10.5)
nRBC: 0 % (ref 0.0–0.2)

## 2023-04-29 MED ORDER — METHOCARBAMOL 500 MG PO TABS
500.0000 mg | ORAL_TABLET | Freq: Four times a day (QID) | ORAL | Status: DC | PRN
  Administered 2023-04-30: 500 mg via ORAL
  Filled 2023-04-29: qty 1

## 2023-04-29 NOTE — Evaluation (Signed)
Physical Therapy Evaluation Patient Details Name: Tracy Park MRN: [id number] DOB: [birth year] Today's Date: 04/29/2023  History of Present Illness  67 yo female admitted with COPD exac. Hx of COPD-O2 dep 2L, lung ca, sleep apnea, bipolar d/o, PTSD  Clinical Impression  On eval, observed pt to ambulate on her own in her room. She was CGA while ambulating in hallway with intermittent unsteadiness and 1 standing rest break. She walked without a device. O2 93% on 2L O2.  Will plan to follow and progress activity as tolerated.       If plan is discharge home, recommend the following:     Can travel by private vehicle        Equipment Recommendations None recommended by PT  Recommendations for Other Services       Functional Status Assessment Patient has had a recent decline in their functional status and demonstrates the ability to make significant improvements in function in a reasonable and predictable amount of time.     Precautions / Restrictions Precautions Precautions: Fall Precaution Comments: O2 dependent at baseline Restrictions Weight Bearing Restrictions: No      Mobility  Bed Mobility Overal bed mobility: Modified Independent                  Transfers Overall transfer level: Modified independent                      Ambulation/Gait Ambulation/Gait assistance: Contact guard assist Gait Distance (Feet): 75 Feet Assistive device: None Gait Pattern/deviations: Step-through pattern, Decreased stride length       General Gait Details: intermittent unsteadiness. dyspnea 2/4. O2 93% on 2L. 1 brief standing rest break. could benefit from cane use (issued one last admission and encouraged to use per chart review and patient)  Stairs Stairs: Yes Stairs assistance: Contact guard assist Stair Management: One rail Right, Step to pattern, Forwards Number of Stairs: 3    Wheelchair Mobility     Tilt Bed    Modified Rankin (Stroke Patients  Only)       Balance Overall balance assessment: Needs assistance         Standing balance support: During functional activity, No upper extremity supported Standing balance-Leahy Scale: Fair                               Pertinent Vitals/Pain Pain Assessment Pain Assessment: No/denies pain    Home Living Family/patient expects to be discharged to:: Private residence Living Arrangements: Other (Comment) (friend) Available Help at Discharge: Family;Available 24 hours/day Type of Home: House Home Access: Stairs to enter Entrance Stairs-Rails: Right Entrance Stairs-Number of Steps: 3   Home Layout: One level Home Equipment: Cane - single point Additional Comments: On 2 L O2 at home 24/7    Prior Function Prior Level of Function : Independent/Modified Independent             Mobility Comments: does not normally amb with device ADLs Comments: Ind with ADLs/selfcare, IADLs, home mgt, cooking, drives     Extremity/Trunk Assessment   Upper Extremity Assessment Upper Extremity Assessment: Overall WFL for tasks assessed    Lower Extremity Assessment Lower Extremity Assessment: Generalized weakness    Cervical / Trunk Assessment Cervical / Trunk Assessment: Normal  Communication   Communication Communication: No apparent difficulties  Cognition Arousal: Alert Behavior During Therapy: WFL for tasks assessed/performed Overall Cognitive Status: Within Functional Limits for tasks assessed  General Comments      Exercises     Assessment/Plan    PT Assessment Patient needs continued PT services  PT Problem List Decreased activity tolerance;Decreased balance;Decreased mobility       PT Treatment Interventions DME instruction;Gait training;Functional mobility training;Therapeutic activities;Therapeutic exercise;Patient/family education;Balance training    PT Goals (Current goals can be  found in the Care Plan section)  Acute Rehab PT Goals Patient Stated Goal: to feel better PT Goal Formulation: With patient Time For Goal Achievement: 05/13/23 Potential to Achieve Goals: Good    Frequency Min 1X/week     Co-evaluation               AM-PAC PT "6 Clicks" Mobility  Outcome Measure Help needed turning from your back to your side while in a flat bed without using bedrails?: None Help needed moving from lying on your back to sitting on the side of a flat bed without using bedrails?: None Help needed moving to and from a bed to a chair (including a wheelchair)?: None Help needed standing up from a chair using your arms (e.g., wheelchair or bedside chair)?: None Help needed to walk in hospital room?: A Little Help needed climbing 3-5 steps with a railing? : A Little 6 Click Score: 22    End of Session Equipment Utilized During Treatment: Gait belt Activity Tolerance: Patient tolerated treatment well Patient left: in bed;with call bell/phone within reach   PT Visit Diagnosis: Unsteadiness on feet (R26.81)    Time: 1610-9604 PT Time Calculation (min) (ACUTE ONLY): 18 min   Charges:   PT Evaluation $PT Eval Low Complexity: 1 Low   PT General Charges $$ ACUTE PT VISIT: 1 Visit            Faye Ramsay, PT Acute Rehabilitation  Office: [phone]

## 2023-04-29 NOTE — Plan of Care (Signed)

## 2023-04-29 NOTE — Hospital Course (Signed)
Tracy Park is a 67 y.o. female with a history of paroxysmal atrial fibrillation, hypothyroidism, tobacco use, COPD, chronic respiratory failure on 2 L/min of oxygen.  Patient presented secondary to shortness of breath and found to have recurrent COPD exacerbation.  She has recently been seen in the hospital with admissions for similar presentations.  Patient started on Solu-Medrol and scheduled DuoNebs for management of COPD exacerbation.  No increased oxygen requirement.

## 2023-04-29 NOTE — Progress Notes (Signed)
PROGRESS NOTE    Tracy Park  QMV:[id number] DOB: [birth year] DOA: 04/28/2023 PCP: Jacquelin Hawking, PA-C   Brief Narrative: Tracy Park is a 67 y.o. female with a history of paroxysmal atrial fibrillation, hypothyroidism, tobacco use, COPD, chronic respiratory failure on 2 L/min of oxygen.  Patient presented secondary to shortness of breath and found to have recurrent COPD exacerbation.  She has recently been seen in the hospital with admissions for similar presentations.  Patient started on Solu-Medrol and scheduled DuoNebs for management of COPD exacerbation.  No increased oxygen requirement.   Assessment and Plan:  COPD exacerbation Patient with worsening cough with associated dyspnea on exertion and at rest.  Patient started on IV steroids with scheduled DuoNebs.  Ultimately, patient needs to completely cease from tobacco use. -Continue IV steroids -Continue DuoNeb scheduled -Continue send spirometer and flutter valve -OT evaluation  Primary hypertension Patient is on losartan as an outpatient.  Losartan held on admission secondary to low normal blood pressure.  Patient is currently normotensive at this time so we will continue to hold losartan.  Paroxysmal atrial fibrillation Patient is on metoprolol and Eliquis as an outpatient. -Continue metoprolol and Eliquis  Leukocytosis Mild.  Presumed secondary to recent steroid use.  Low concern for bacterial infection. Trended down slightly.  Anxiety -Continue Xanax 0.5 mg twice daily  Hypothyroidism -Continue Synthroid  Hyperlipidemia -Continue Crestor  GERD -Continue Protonix  DVT prophylaxis: Eliquis Code Status:   Code Status: DNR Family Communication: None at bedside Disposition Plan: Discharge home once functionally close to baseline to allow for safety at home. Likely home in 24 hours   Consultants:  None  Procedures:  None  Antimicrobials: None   Subjective: Patient reports improved  symptoms, however she states that she is still not at baseline and is worried about functional capabilities at home.  Objective: BP 107/73 (BP Location: Right Arm)   Pulse 73   Temp 98.1 F (36.7 C) (Oral)   Resp 14   Ht 5\' 3"  (1.6 m)   SpO2 99%   BMI 18.59 kg/m   Examination:  General exam: Appears calm and comfortable Respiratory system: Mild wheezing heard anteriorly. Respiratory effort normal. Cardiovascular system: S1 & S2 heard, RRR. No murmurs, rubs, gallops or clicks. Gastrointestinal system: Abdomen is nondistended, soft and nontender. Normal bowel sounds heard. Central nervous system: Alert and oriented. No focal neurological deficits. Musculoskeletal: No edema. No calf tenderness Psychiatry: Judgement and insight appear normal. Mood & affect appropriate.    Data Reviewed: I have personally reviewed following labs and imaging studies  CBC Lab Results  Component Value Date   WBC 11.9 (H) 04/29/2023   RBC 4.34 04/29/2023   HGB 12.1 04/29/2023   HCT 39.0 04/29/2023   MCV 89.9 04/29/2023   MCH 27.9 04/29/2023   PLT 190 04/29/2023   MCHC 31.0 04/29/2023   RDW 14.5 04/29/2023   LYMPHSABS 3.4 04/28/2023   MONOABS 0.8 04/28/2023   EOSABS 0.0 04/28/2023   BASOSABS 0.0 04/28/2023     Last metabolic panel Lab Results  Component Value Date   NA 135 04/29/2023   K 4.0 04/29/2023   CL 97 (L) 04/29/2023   CO2 30 04/29/2023   BUN 23 04/29/2023   CREATININE 0.55 04/29/2023   GLUCOSE 170 (H) 04/29/2023   GFRNONAA >60 04/29/2023   GFRAA >60 04/12/2020   CALCIUM 8.6 (L) 04/29/2023   PHOS 3.7 03/09/2023   PROT 5.8 (L) 04/12/2023   ALBUMIN 3.0 (L) 04/12/2023  BILITOT 0.3 04/12/2023   ALKPHOS 53 04/12/2023   AST 14 (L) 04/12/2023   ALT 18 04/12/2023   ANIONGAP 8 04/29/2023    GFR: Estimated Creatinine Clearance: 52.7 mL/min (by C-G formula based on SCr of 0.55 mg/dL).  Recent Results (from the past 240 hour(s))  SARS Coronavirus 2 by RT PCR (hospital order,  performed in [facility] hospital lab) *cepheid single result test* Anterior Nasal Swab     Status: None   Collection Time: 04/26/23  3:27 AM   Specimen: Anterior Nasal Swab  Result Value Ref Range Status   SARS Coronavirus 2 by RT PCR NEGATIVE NEGATIVE Final    Comment: (NOTE) SARS-CoV-2 target nucleic acids are NOT DETECTED.  The SARS-CoV-2 RNA is generally detectable in upper and lower respiratory specimens during the acute phase of infection. The lowest concentration of SARS-CoV-2 viral copies this assay can detect is 250 copies / mL. A negative result does not preclude SARS-CoV-2 infection and should not be used as the sole basis for treatment or other patient management decisions.  A negative result may occur with improper specimen collection / handling, submission of specimen other than nasopharyngeal swab, presence of viral mutation(s) within the areas targeted by this assay, and inadequate number of viral copies (<250 copies / mL). A negative result must be combined with clinical observations, patient history, and epidemiological information.  Fact Sheet for Patients:   RoadLapTop.co.za  Fact Sheet for Healthcare Providers: http://kim-miller.com/  This test is not yet approved or  cleared by the [place] FDA and has been authorized for detection and/or diagnosis of SARS-CoV-2 by FDA under an Emergency Use Authorization (EUA).  This EUA will remain in effect (meaning this test can be used) for the duration of the COVID-19 declaration under Section 564(b)(1) of the Act, 21 U.S.C. section 360bbb-3(b)(1), unless the authorization is terminated or revoked sooner.  Performed at [facility], [address], [place], [place] [address]       Radiology Studies: DG Chest Portable 1 View  Result Date: 04/28/2023 CLINICAL DATA:  Dyspnea EXAM: PORTABLE CHEST 1 VIEW COMPARISON:  04/26/2023, CT 03/08/2023  FINDINGS: Surgical changes of left upper lobectomy with associated left-sided volume loss and mild elevation of the left hemidiaphragm is again noted. Stable left apical pleural thickening. The right lung appears stably mildly hyperinflated in keeping with changes of underlying COPD. No pneumothorax or pleural effusion. No confluent pulmonary infiltrate. Cardiac size within normal limits. Pulmonary vascularity is normal. No acute bone abnormality. IMPRESSION: 1. No active disease. 2. COPD. 3. Surgical changes of left upper lobectomy. Electronically Signed   By: Helyn Numbers M.D.   On: 04/28/2023 02:29      LOS: 1 day    Jacquelin Hawking, MD [facility] 04/29/2023, 8:02 AM   If 7PM-7AM, please contact night-coverage [email or web address]

## 2023-04-29 NOTE — TOC CM/SW Note (Signed)
Transition of Care [facility]) - Inpatient Brief Assessment   Patient Details  Name: Tracy Park MRN: [id number] Date of Birth: 1955/12/03  Transition [facility]) CM/SW Contact:    Otelia Santee, LCSW Phone Number: 04/29/2023, 2:03 PM   Clinical Narrative: Pt chronically on 2L O2. Pt receives O2 through [employer]. No TOC needs identified at this time.   Transition of Care Asessment: Insurance and Status: Insurance coverage has been reviewed Patient has primary care physician: Yes Home environment has been reviewed: Home Prior level of function:: Independent Prior/Current Home Services: No current home services Social Determinants of Health Reivew: SDOH reviewed no interventions necessary Readmission risk has been reviewed: Yes Transition of care needs: no transition of care needs at this time

## 2023-04-29 NOTE — Progress Notes (Signed)
Mobility Specialist - Progress Note   04/29/23 1012  Oxygen Therapy  SpO2 96 %  O2 Device Nasal Cannula  O2 Flow Rate (L/min) 2 L/min  Patient Activity (if Appropriate) Ambulating  Mobility  Activity Ambulated independently in hallway  Level of Assistance Independent  Assistive Device None  Distance Ambulated (ft) 350 ft  Activity Response Tolerated well  Mobility Referral Yes  Mobility Specialist Start Time (ACUTE ONLY) [id number]  Mobility Specialist Stop Time (ACUTE ONLY) 1008  Mobility Specialist Time Calculation (min) (ACUTE ONLY) 10 min   Pt received in bed and agreeable to mobility. No complaints during session. Pt to bed after session with all needs met.    Pre-mobility: 92 HR, 98% SpO2 (2L [place]) During mobility: 107 HR, 96% SpO2 (2L [place]) Post-mobility: 100 HR, 94%SPO2 (2L [place])  Chief Technology Officer

## 2023-04-30 DIAGNOSIS — J441 Chronic obstructive pulmonary disease with (acute) exacerbation: Secondary | ICD-10-CM | POA: Diagnosis not present

## 2023-04-30 MED ORDER — DOXYCYCLINE HYCLATE 100 MG PO TABS
100.0000 mg | ORAL_TABLET | Freq: Two times a day (BID) | ORAL | Status: DC
  Administered 2023-04-30 – 2023-05-01 (×3): 100 mg via ORAL
  Filled 2023-04-30 (×3): qty 1

## 2023-04-30 MED ORDER — MAGNESIUM OXIDE -MG SUPPLEMENT 400 (240 MG) MG PO TABS
400.0000 mg | ORAL_TABLET | Freq: Two times a day (BID) | ORAL | Status: AC
  Administered 2023-04-30 (×2): 400 mg via ORAL
  Filled 2023-04-30 (×2): qty 1

## 2023-04-30 MED ORDER — ALBUTEROL SULFATE (2.5 MG/3ML) 0.083% IN NEBU: 2.5000 mg | INHALATION_SOLUTION | RESPIRATORY_TRACT | Status: DC | PRN

## 2023-04-30 MED ORDER — UMECLIDINIUM BROMIDE 62.5 MCG/ACT IN AEPB
1.0000 | INHALATION_SPRAY | Freq: Two times a day (BID) | RESPIRATORY_TRACT | Status: DC
  Administered 2023-04-30 – 2023-05-01 (×3): 1 via RESPIRATORY_TRACT
  Filled 2023-04-30: qty 7

## 2023-04-30 MED ORDER — FLUTICASONE FUROATE-VILANTEROL 200-25 MCG/ACT IN AEPB
1.0000 | INHALATION_SPRAY | Freq: Two times a day (BID) | RESPIRATORY_TRACT | Status: DC
  Administered 2023-04-30 – 2023-05-01 (×3): 1 via RESPIRATORY_TRACT
  Filled 2023-04-30: qty 28

## 2023-04-30 MED ORDER — BUDESON-GLYCOPYRROL-FORMOTEROL 160-9-4.8 MCG/ACT IN AERO: 1.0000 | INHALATION_SPRAY | Freq: Two times a day (BID) | RESPIRATORY_TRACT | Status: DC

## 2023-04-30 MED ORDER — IPRATROPIUM-ALBUTEROL 0.5-2.5 (3) MG/3ML IN SOLN: 3.0000 mL | RESPIRATORY_TRACT | Status: DC | PRN

## 2023-04-30 MED ORDER — PREDNISONE 50 MG PO TABS
50.0000 mg | ORAL_TABLET | Freq: Every day | ORAL | Status: DC
  Administered 2023-05-01: 50 mg via ORAL
  Filled 2023-04-30: qty 1

## 2023-04-30 NOTE — Plan of Care (Signed)

## 2023-04-30 NOTE — Progress Notes (Signed)
Chaplain met with Tracy Park to provide support.  She stated that she is having a very hard day and feels that everyone is abandoning her and this led to some spiritual distress as well.  Her sister has stated that she can live with her, but her sister lives in [place] and she has no way to get down there.  She does not want to live in a facility as she watched her aunt go through that and it was "horrible."   Chaplain provided emotional and spiritual support as she thought through next steps for how to protect and care for herself.    [address], Bcc Pager, [phone]

## 2023-04-30 NOTE — Evaluation (Signed)
Occupational Therapy Evaluation Patient Details Name: Tracy Park MRN: [id number] DOB: 05-30-1956 Today's Date: 04/30/2023   History of Present Illness Patient is a 67 yo female admitted with COPD exac. Hx of COPD-O2 dep 2L, lung ca, sleep apnea, bipolar d/o, PTSD   Clinical Impression   Patient evaluated by Occupational Therapy with no further acute OT needs identified. All education has been completed and the patient has no further questions. Patient is at baseline for ADLs at this time.  See below for any follow-up Occupational Therapy or equipment needs. OT is signing off. Thank you for this referral.        If plan is discharge home, recommend the following: Assistance with cooking/housework;Assist for transportation    Functional Status Assessment  Patient has not had a recent decline in their functional status        Precautions / Restrictions Precautions Precautions: Fall Precaution Comments: O2 dependent at baseline Restrictions Weight Bearing Restrictions: No             ADL either performed or assessed with clinical judgement   ADL Overall ADL's : At baseline         General ADL Comments: patient is independent in room with toielting per patient and nursing report. patient decliend to engage in ADLs at this time reporting plesantly that she did not need skilled OT serivces. patient was educated on O2 cord management strategeies,and ECT. patient verbalized understanding. patient was noted to report that she has had multiple lossess and difficulty with current livign situation. patient's thoughts and feelings were heard and validated. patient endorsed that she did not need therapy     Vision Patient Visual Report: No change from baseline              Pertinent Vitals/Pain Pain Assessment Pain Assessment: No/denies pain     Extremity/Trunk Assessment Upper Extremity Assessment Upper Extremity Assessment: Overall WFL for tasks assessed   Lower  Extremity Assessment Lower Extremity Assessment: Defer to PT evaluation       Communication Communication Communication: No apparent difficulties   Cognition Arousal: Alert Behavior During Therapy: WFL for tasks assessed/performed Overall Cognitive Status: Within Functional Limits for tasks assessed         General Comments: tearful during session reporting that today is the sate of a loss of previous signficant other. patient was provided with theraputic listening and thoughts/feeling were heard and validated. message was sent to chaplin services per patient request.                Home Living Family/patient expects to be discharged to:: Private residence Living Arrangements: Other (Comment) (friend and daughter) Available Help at Discharge: Family;Available 24 hours/day Type of Home: House Home Access: Stairs to enter [employer] of Steps: 3 Entrance Stairs-Rails: Right Home Layout: One level     Bathroom Shower/Tub: Chief Strategy Officer: Standard Bathroom Accessibility: Yes   Home Equipment: Cane - single point   Additional Comments: On 2 L O2 at home 24/7      Prior Functioning/Environment Prior Level of Function : Independent/Modified Independent             Mobility Comments: does not normally amb with device ADLs Comments: Ind with ADLs/selfcare, IADLs, home mgt, cooking, drives        OT Problem List:        OT Treatment/Interventions:      OT Goals(Current goals can be found in the care plan section) Acute Rehab OT  Goals OT Goal Formulation: All assessment and education complete, DC therapy  OT Frequency:         AM-PAC OT "6 Clicks" Daily Activity     Outcome Measure Help from another person eating meals?: None Help from another person taking care of personal grooming?: None Help from another person toileting, which includes using toliet, bedpan, or urinal?: None Help from another person bathing (including  washing, rinsing, drying)?: None Help from another person to put on and taking off regular upper body clothing?: None Help from another person to put on and taking off regular lower body clothing?: None 6 Click Score: 24   End of Session Equipment Utilized During Treatment: Oxygen  Activity Tolerance: Patient tolerated treatment well Patient left: in bed;with call bell/phone within reach  OT Visit Diagnosis: Unsteadiness on feet (R26.81)                Time: 5366-4403 OT Time Calculation (min): 29 min Charges:  OT General Charges $OT Visit: 1 Visit OT Evaluation $OT Eval Low Complexity: 1 Low OT Treatments $Self Care/Home Management : 8-22 mins  Rosalio Loud, MS Acute Rehabilitation Department Office# [phone]   Selinda Flavin 04/30/2023, 12:54 PM

## 2023-04-30 NOTE — TOC Progression Note (Signed)
Transition of Care [facility]) - Progression Note    Patient Details  Name: Tracy Park MRN: [id number] Date of Birth: [birth year]  Transition [facility]) CM/SW Contact  Harriett Sine, RN Phone [phone]  04/30/2023, 2:00 PM  Clinical Narrative:    Spoke with pt at bedside about HHPT agency. Pt states she has service with [facility] and want services d/c. Pt given [facility] agency list and ask view medicare.gov for more information. Pt states O2 services is with [employer] and wants to continue with [employer] in future.         Expected Discharge Plan and Services                                               Social Determinants of Health (SDOH) Interventions SDOH Screenings   Food Insecurity: No Food Insecurity (04/28/2023)  Recent Concern: Food Insecurity - Food Insecurity Present (04/07/2023)   Received from [facility]  Housing: Low Risk  (04/28/2023)  Recent Concern: Housing - Medium Risk (04/12/2023)  Transportation Needs: No Transportation Needs (04/28/2023)  Recent Concern: Transportation Needs - Unmet Transportation Needs (04/12/2023)  Utilities: Not At Risk (04/28/2023)  Financial Resource Strain: Medium Risk (04/07/2023)   Received from [facility]  Physical Activity: Unknown (04/07/2023)   Received from [facility]  Social Connections: Moderately Integrated (04/07/2023)   Received from [facility]  Stress: Stress Concern Present (04/07/2023)   Received from [facility]  Tobacco Use: High Risk (04/28/2023)    Readmission Risk Interventions    04/29/2023    2:02 PM 04/17/2023    4:34 PM 04/13/2023    6:12 PM  Readmission Risk Prevention Plan  Transportation Screening Complete Complete Complete  Medication Review [employer]) Complete Complete Complete  PCP or Specialist appointment within 3-5 days of discharge Complete Complete Complete  HRI or Home Care Consult Complete Complete Complete  SW Recovery Care/Counseling Consult Complete  Complete Complete  Palliative Care Screening Not Applicable Not Applicable Not Applicable  Skilled Nursing Facility Not Applicable Not Applicable Not Applicable

## 2023-04-30 NOTE — Progress Notes (Signed)
[facility] PROGRESS NOTE    Progress Note  Tracy Park  YQI:[id number] DOB: [birth year] DOA: 04/28/2023 PCP: Jacquelin Hawking, PA-C     Brief Narrative:   Tracy Park is an 67 y.o. female past medical history of paroxysmal atrial fibrillation hypothyroidism COPD on 2 L of oxygen, also history of lung cancer comes in for shortness of breath found to be in acute exacerbation of COPD    Assessment/Plan:   Acute on chronic respiratory failure with hypoxia secondary to  COPD with acute exacerbation (HCC): Still wheezing on physical exam continue steroids, start her on oral antibiotics. Continue inhalers. Incentive spirometry and flutter valve. Resume home inhalers.  Essential hypertension: She is currently normotensive and she will have to be discharged off losartan.  Paroxysmal atrial fibrillation: Continue metoprolol and Eliquis.  Leukocytosis: Likely due to steroids.  Anxiety: Continue Xanax.  Hypothyroidism: Continue Synthroid.  Hyperlipidemia, Continue Crestor.    DVT prophylaxis: eliquis Family Communication:none Status is: Inpatient Remains inpatient appropriate because: Acute COPD exacerbation    Code Status:     Code Status Orders  (From admission, onward)           Start     Ordered   04/28/23 0741  Do not attempt resuscitation (DNR)  Continuous       Question Answer Comment  If patient has no pulse and is not breathing Do Not Attempt Resuscitation   If patient has a pulse and/or is breathing: Medical Treatment Goals LIMITED ADDITIONAL INTERVENTIONS: Use medication/IV fluids and cardiac monitoring as indicated; Do not use intubation or mechanical ventilation (DNI), also provide comfort medications.  Transfer to Progressive/Stepdown as indicated, avoid Intensive Care.   Consent: Discussion documented in EHR or advanced directives reviewed      04/28/23 0740           Code Status History     Date Active Date Inactive Code  Status Order ID Comments User Context   04/17/2023 0151 04/18/2023 1944 DNR [id number]  Anselm Jungling, DO Inpatient   04/11/2023 2132 04/14/2023 0035 Full Code [id number]  Hugelmeyer, [place], DO ED   03/26/2023 0938 03/27/2023 1838 DNR [id number]  Bobette Mo, MD Inpatient   03/06/2023 2249 03/10/2023 2107 DNR [id number]  Angie Fava, DO ED   02/23/2023 2046 02/26/2023 2245 DNR [id number]  Briscoe Deutscher, MD ED   01/25/2023 2350 01/28/2023 1928 DNR [id number]  Anselm Jungling, DO Inpatient   01/08/2023 0235 01/13/2023 1928 DNR [id number]  Hillary Bow, DO Inpatient   01/08/2023 0117 01/08/2023 0235 Full Code [id number]  Hillary Bow, DO ED   03/24/2022 1159 03/28/2022 1546 Full Code [id number]  Bobette Mo, MD ED   03/14/2022 2132 03/16/2022 1602 Full Code [id number]  Karl Ito, MD Inpatient   03/14/2022 0331 03/14/2022 2132 DNR [id number]  Tomma Lightning, MD ED   03/13/2022 2149 03/14/2022 0331 DNR [id number]  Mansy, Vernetta Honey, MD ED   02/26/2022 0200 03/01/2022 1555 DNR [id number]  Anselm Jungling, DO ED   08/16/2021 2316 08/25/2021 2324 DNR [id number]  John Giovanni, MD ED   08/16/2021 2253 08/16/2021 2316 Full Code [id number]  John Giovanni, MD ED   05/03/2021 0052 05/05/2021 1756 Full Code [id number]  Eduard Clos, MD ED   04/10/2020 0342 04/12/2020 2137 Full Code [id number]  Eduard Clos, MD ED   04/28/2012 0926 04/28/2012 2047 Full Code [id number]  Kelby Aline, RN Inpatient  IV Access:   Peripheral IV   Procedures and diagnostic studies:   No results found.   Medical Consultants:   None.   Subjective:    Tracy Park relates her breathing is not improved.  Objective:    Vitals:   04/29/23 2013 04/30/23 0439 04/30/23 0851 04/30/23 1027  BP:  118/78    Pulse:  71    Resp:  17    Temp:  98 F (36.7 C)    TempSrc:  Oral    SpO2: 98% 97% 97% 94%  Height:       SpO2: 94 % O2 Flow Rate (L/min): 2 L/min FiO2 (%): 28 %  No intake or output data in  the 24 hours ending 04/30/23 1251 There were no vitals filed for this visit.  Exam: General exam: In no acute distress. Respiratory system: Good air movement and rhonchi and wheezing bilaterally Cardiovascular system: S1 & S2 heard, RRR. No JVD. Gastrointestinal system: Abdomen is nondistended, soft and nontender.  Extremities: No pedal edema. Skin: No rashes, lesions or ulcers Psychiatry: Judgement and insight appear normal. Mood & affect appropriate.    Data Reviewed:    Labs: Basic Metabolic Panel: Recent Labs  Lab 04/26/23 0251 04/28/23 0249 04/29/23 0402  NA 138 133* 135  K 3.5 3.6 4.0  CL 96* 94* 97*  CO2 33* 32 30  GLUCOSE 78 90 170*  BUN 18 22 23   CREATININE 0.51 0.49 0.55  CALCIUM 8.6* 8.5* 8.6*   GFR Estimated Creatinine Clearance: 52.7 mL/min (by C-G formula based on SCr of 0.55 mg/dL). Liver Function Tests: No results for input(s): "AST", "ALT", "ALKPHOS", "BILITOT", "PROT", "ALBUMIN" in the last 168 hours. No results for input(s): "LIPASE", "AMYLASE" in the last 168 hours. No results for input(s): "AMMONIA" in the last 168 hours. Coagulation profile No results for input(s): "INR", "PROTIME" in the last 168 hours. COVID-19 Labs  No results for input(s): "DDIMER", "FERRITIN", "LDH", "CRP" in the last 72 hours.  Lab Results  Component Value Date   SARSCOV2NAA NEGATIVE 04/26/2023   SARSCOV2NAA NEGATIVE 04/17/2023   SARSCOV2NAA NEGATIVE 04/11/2023   SARSCOV2NAA NEGATIVE 03/26/2023    CBC: Recent Labs  Lab 04/26/23 0251 04/28/23 0249 04/29/23 0402  WBC 12.2* 12.1* 11.9*  NEUTROABS 9.7* 7.9*  --   HGB 12.5 13.3 12.1  HCT 40.1 42.3 39.0  MCV 92.6 90.0 89.9  PLT 181 203 190   Cardiac Enzymes: No results for input(s): "CKTOTAL", "CKMB", "CKMBINDEX", "TROPONINI" in the last 168 hours. BNP (last 3 results) No results for input(s): "PROBNP" in the last 8760 hours. CBG: Recent Labs  Lab 04/26/23 0137  GLUCAP 77   D-Dimer: No results for  input(s): "DDIMER" in the last 72 hours. Hgb A1c: No results for input(s): "HGBA1C" in the last 72 hours. Lipid Profile: No results for input(s): "CHOL", "HDL", "LDLCALC", "TRIG", "CHOLHDL", "LDLDIRECT" in the last 72 hours. Thyroid function studies: No results for input(s): "TSH", "T4TOTAL", "T3FREE", "THYROIDAB" in the last 72 hours.  Invalid input(s): "FREET3" Anemia work up: No results for input(s): "VITAMINB12", "FOLATE", "FERRITIN", "TIBC", "IRON", "RETICCTPCT" in the last 72 hours. Sepsis Labs: Recent Labs  Lab 04/26/23 0251 04/28/23 0249 04/29/23 0402  WBC 12.2* 12.1* 11.9*   Microbiology Recent Results (from the past 240 hour(s))  SARS Coronavirus 2 by RT PCR (hospital order, performed in [facility] hospital lab) *cepheid single result test* Anterior Nasal Swab     Status: None   Collection Time: 04/26/23  3:27 AM  Specimen: Anterior Nasal Swab  Result Value Ref Range Status   SARS Coronavirus 2 by RT PCR NEGATIVE NEGATIVE Final    Comment: (NOTE) SARS-CoV-2 target nucleic acids are NOT DETECTED.  The SARS-CoV-2 RNA is generally detectable in upper and lower respiratory specimens during the acute phase of infection. The lowest concentration of SARS-CoV-2 viral copies this assay can detect is 250 copies / mL. A negative result does not preclude SARS-CoV-2 infection and should not be used as the sole basis for treatment or other patient management decisions.  A negative result may occur with improper specimen collection / handling, submission of specimen other than nasopharyngeal swab, presence of viral mutation(s) within the areas targeted by this assay, and inadequate number of viral copies (<250 copies / mL). A negative result must be combined with clinical observations, patient history, and epidemiological information.  Fact Sheet for Patients:   RoadLapTop.co.za  Fact Sheet for Healthcare  Providers: http://kim-miller.com/  This test is not yet approved or  cleared by the [place] FDA and has been authorized for detection and/or diagnosis of SARS-CoV-2 by FDA under an Emergency Use Authorization (EUA).  This EUA will remain in effect (meaning this test can be used) for the duration of the COVID-19 declaration under Section 564(b)(1) of the Act, 21 U.S.C. section 360bbb-3(b)(1), unless the authorization is terminated or revoked sooner.  Performed at [facility], [address], [place], [place] [address]   Expectorated Sputum Assessment w Gram Stain, Rflx to Resp Cult     Status: None   Collection Time: 04/29/23  7:37 PM  Result Value Ref Range Status   Specimen Description EXPECTORATED SPUTUM  Final   Special Requests NONE  Final   Sputum evaluation   Final    THIS SPECIMEN IS ACCEPTABLE FOR SPUTUM CULTURE Performed at [facility], [address] [place], [place] [address]    Report Status 04/29/2023 FINAL  Final  Culture, Respiratory w Gram Stain     Status: None (Preliminary result)   Collection Time: 04/29/23  7:37 PM  Result Value Ref Range Status   Specimen Description   Final    EXPECTORATED SPUTUM Performed at [facility], [address], [place], [place] [address]    Special Requests   Final    NONE Reflexed from [id number] Performed at [facility], [address] [place], [place] [address]    Gram Stain   Final    FEW WBC PRESENT,BOTH PMN AND MONONUCLEAR FEW GRAM POSITIVE COCCI IN PAIRS FEW GRAM NEGATIVE RODS FEW GRAM POSITIVE RODS Performed at [facility], [address], [place], [place] [address]    Culture PENDING  Incomplete   Report Status PENDING  Incomplete     Medications:    apixaban  5 mg Oral BID   doxycycline  100 mg Oral Q12H   ipratropium-albuterol  3 mL Nebulization BID   levothyroxine  125 mcg Oral QAC breakfast    magnesium oxide  400 mg Oral BID   metoprolol succinate  12.5 mg Oral Daily   pantoprazole  40 mg Oral QAC breakfast   [START ON 05/01/2023] predniSONE  50 mg Oral Q breakfast   rosuvastatin  20 mg Oral Daily   tamsulosin  0.4 mg Oral Daily   Continuous Infusions:    LOS: 2 days   Marinda Elk  [facility]  04/30/2023, 12:51 PM

## 2023-04-30 NOTE — Progress Notes (Signed)
Mobility Specialist - Progress Note   04/30/23 1027  Oxygen Therapy  SpO2 94 %  O2 Device Nasal Cannula  O2 Flow Rate (L/min) 2 L/min  Patient Activity (if Appropriate) Ambulating  Mobility  Activity Ambulated independently in hallway  Level of Assistance Independent  Assistive Device None  Distance Ambulated (ft) 700 ft  Activity Response Tolerated well  Mobility Referral Yes  $Mobility charge 1 Mobility  Mobility Specialist Start Time (ACUTE ONLY) 1012  Mobility Specialist Stop Time (ACUTE ONLY) 1026  Mobility Specialist Time Calculation (min) (ACUTE ONLY) 14 min   Pt received in bed and agreeable to mobility. No complaints during session. Pt to bed after session with all needs met.    [facility]

## 2023-05-01 ENCOUNTER — Other Ambulatory Visit (HOSPITAL_COMMUNITY): Payer: Self-pay

## 2023-05-01 DIAGNOSIS — J441 Chronic obstructive pulmonary disease with (acute) exacerbation: Secondary | ICD-10-CM | POA: Diagnosis not present

## 2023-05-01 MED ORDER — DOXYCYCLINE HYCLATE 100 MG PO TABS
100.0000 mg | ORAL_TABLET | Freq: Two times a day (BID) | ORAL | 0 refills | Status: AC
  Filled 2023-05-01: qty 10, 5d supply, fill #0

## 2023-05-01 MED ORDER — PREDNISONE 10 MG (21) PO TBPK
ORAL_TABLET | ORAL | 0 refills | Status: DC
  Filled 2023-05-01: qty 21, 6d supply, fill #0

## 2023-05-01 NOTE — Discharge Summary (Addendum)
Physician Discharge Summary  Tracy Park Brann JXB:[id number] DOB: 1956-03-25 DOA: 04/28/2023  PCP: Jacquelin Hawking, PA-C  Admit date: 04/28/2023 Discharge date: 05/01/2023  Admitted From: Home Disposition:  Home  Recommendations for Outpatient Follow-up:  Follow up with PCP in 1-2 weeks Please obtain BMP/CBC in one week   Home Health: Yes Equipment/Devices:None  Discharge Condition:Stable CODE STATUS:Full Diet recommendation: Heart Healthy   Brief/Interim Summary: 67 y.o. female past medical history of paroxysmal atrial fibrillation hypothyroidism COPD on 2 L of oxygen, also history of lung cancer comes in for shortness of breath found to be in acute exacerbation of COPD, likely due to noncompliance with her medication.  Discharge Diagnoses:  Principal Problem:   COPD with acute exacerbation (HCC)  Acute on chronic respiratory failure with hypoxia secondary to COPD exacerbation: She was started on steroids and antibiotics. We were able to wean her down to oxygen she uses at home which is 2 L. She continue steroids and antibiotics as an outpatient. She will continue inhalers as an outpatient.  Essentialhypertension: She is currently normotensive in the hospital she was discharged off losartan. She will follow-up with PCP as an outpatient resume if needed.  Paroxysmal atrial fibrillation: No changes made to his medication continue metoprolol and Eliquis.  Leukocytosis: Likely due to steroids.  Anxiety: Continue Xanax.  Hypothyroidism: Continue Synthroid.    Discharge Instructions  Discharge Instructions     Diet - low sodium heart healthy   Complete by: As directed    Increase activity slowly   Complete by: As directed       Allergies as of 05/01/2023       Reactions   Red Dye #40 (allura Red) Hives, Itching, Other (See Comments)   Red food dye   Oxycontin [oxycodone Hcl] Other (See Comments)   Hallucinations    Aspirin Hives   Strawberry Extract  Hives, Itching   Tomato Hives, Itching   Tape Rash, Other (See Comments)   Prefers paper tape   Wound Dressing Adhesive Rash        Medication List     TAKE these medications    albuterol 108 (90 Base) MCG/ACT inhaler Commonly known as: VENTOLIN HFA Inhale 2 puffs into the lungs every 6 (six) hours as needed for wheezing or shortness of breath. What changed: Another medication with the same name was changed. Make sure you understand how and when to take each.   albuterol (2.5 MG/3ML) 0.083% nebulizer solution Commonly known as: PROVENTIL Take 3 mLs (2.5 mg total) by nebulization every 6 (six) hours as needed for wheezing or shortness of breath. What changed:  when to take this additional instructions   ALPRAZolam 0.5 MG tablet Commonly known as: XANAX Take 0.5 mg by mouth 2 (two) times daily as needed for anxiety.   Breztri Aerosphere 160-9-4.8 MCG/ACT Aero Generic drug: Budeson-Glycopyrrol-Formoterol Inhale 1 puff into the lungs in the morning and at bedtime.   doxycycline 100 MG tablet Commonly known as: VIBRA-TABS Take 1 tablet (100 mg total) by mouth every 12 (twelve) hours for 5 days.   Eliquis 5 MG Tabs tablet Generic drug: apixaban Take 5 mg by mouth 2 (two) times daily.   levothyroxine 125 MCG tablet Commonly known as: SYNTHROID Take 125 mcg by mouth daily before breakfast.   losartan 25 MG tablet Commonly known as: COZAAR Take 12.5 mg by mouth daily.   Magnesium Oxide 400 MG Caps Take 1 capsule (400 mg total) by mouth daily at 12 noon.   metoprolol  succinate 25 MG 24 hr tablet Commonly known as: TOPROL-XL Take 12.5 mg by mouth daily.   nitroGLYCERIN 0.4 MG SL tablet Commonly known as: NITROSTAT Place 0.4 mg under the tongue every 5 (five) minutes as needed for chest pain.   OXYGEN Inhale 2 L/min into the lungs continuous.   pantoprazole 40 MG tablet Commonly known as: PROTONIX Take 40 mg by mouth daily before breakfast.   predniSONE 10 MG  tablet Commonly known as: DELTASONE Takes 6 tablets for 1 days, then 5 tablets for 1 days, then 4 tablets for 1 days, then 3 tablets for 1 days, then 2 tabs for 1 days, then 1 tab for 1 days, and then stop. What changed: additional instructions   rosuvastatin 20 MG tablet Commonly known as: CRESTOR Take 20 mg by mouth daily.   Systane Complete PF 0.6 % Soln Generic drug: Propylene Glycol (PF) Place 1 drop into both eyes 3 (three) times daily as needed (for dryness).   tamsulosin 0.4 MG Caps capsule Commonly known as: FLOMAX Take 0.4 mg by mouth daily.        Allergies  Allergen Reactions   Red Dye #40 (Allura Red) Hives, Itching and Other (See Comments)    Red food dye   Oxycontin [Oxycodone Hcl] Other (See Comments)    Hallucinations    Aspirin Hives   Strawberry Extract Hives and Itching   Tomato Hives and Itching   Tape Rash and Other (See Comments)    Prefers paper tape   Wound Dressing Adhesive Rash    Consultations: None   Procedures/Studies: DG Chest Portable 1 View  Result Date: 04/28/2023 CLINICAL DATA:  Dyspnea EXAM: PORTABLE CHEST 1 VIEW COMPARISON:  04/26/2023, CT 03/08/2023 FINDINGS: Surgical changes of left upper lobectomy with associated left-sided volume loss and mild elevation of the left hemidiaphragm is again noted. Stable left apical pleural thickening. The right lung appears stably mildly hyperinflated in keeping with changes of underlying COPD. No pneumothorax or pleural effusion. No confluent pulmonary infiltrate. Cardiac size within normal limits. Pulmonary vascularity is normal. No acute bone abnormality. IMPRESSION: 1. No active disease. 2. COPD. 3. Surgical changes of left upper lobectomy. Electronically Signed   By: Helyn Numbers M.D.   On: 04/28/2023 02:29   DG Chest Port 1 View  Result Date: 04/26/2023 CLINICAL DATA:  Cough and shortness of breath. EXAM: PORTABLE CHEST 1 VIEW COMPARISON:  April 16, 2023 FINDINGS: The heart size and  mediastinal contours are within normal limits. There is stable, chronic left apical pleural thickening and adjacent areas of chronic scarring and/or atelectasis, with chronic left-sided volume loss and right to left shift of the superior mediastinum. Surgical sutures and surgical clips are also seen along the suprahilar region on the left. There is no evidence of acute infiltrate, pleural effusion or pneumothorax. Postoperative changes are seen within the visualized portion of the lower cervical spine. No acute osseous abnormalities are identified. IMPRESSION: Chronic left apical pleural thickening and adjacent areas of chronic scarring and/or atelectasis, with chronic left-sided volume loss. Electronically Signed   By: Aram Candela M.D.   On: 04/26/2023 01:56   DG Chest 2 View  Result Date: 04/16/2023 CLINICAL DATA:  Shortness of breath and wheezing. EXAM: CHEST - 2 VIEW COMPARISON:  04/11/2023, multiple priors.  Most recent CT 03/08/2023 FINDINGS: Emphysema with chronic hyperinflation. Bronchial thickening is chronic but increased. Chronic left lung volume loss with pleuroparenchymal opacity at the apex. The left lower lobe nodule on prior CT is not  well-defined by radiograph. No acute airspace disease, pneumothorax or significant pleural effusion. Stable heart size and mediastinal contours. Postsurgical change at the left hilum. No acute osseous abnormalities are seen. IMPRESSION: 1. Emphysema with chronic hyperinflation. Bronchial thickening is chronic but increased, possible COPD exacerbation. 2. Chronic postsurgical left lung volume loss with pleuroparenchymal opacity at the apex. The left lower lobe nodule on prior CT is not well-defined by radiograph. Electronically Signed   By: Narda Rutherford M.D.   On: 04/16/2023 18:42   DG Foot Complete Left  Result Date: 04/11/2023 CLINICAL DATA:  Dropped heavy object on foot. EXAM: LEFT FOOT - COMPLETE 3+ VIEW COMPARISON:  None Available. FINDINGS: There is  no evidence of fracture or dislocation. There is no evidence of arthropathy or other focal bone abnormality. Soft tissues are unremarkable. IMPRESSION: Negative. Electronically Signed   By: Charlett Nose M.D.   On: 04/11/2023 20:17   DG Chest Port 1 View  Result Date: 04/11/2023 CLINICAL DATA:  Shortness of breath. Productive cough. History of known lung cancer. Prior left upper lobectomy. EXAM: PORTABLE CHEST 1 VIEW COMPARISON:  X-ray 03/26/2023 FINDINGS: Once again surgical changes from left-sided upper lobectomy with volume loss, retraction of the hilum and apical pleural thickening. Chronic lung changes elsewhere. No pneumothorax, edema. Normal cardiopericardial silhouette. Overlapping cardiac leads. IMPRESSION: Stable surgical changes from left lobectomy with volume loss, scarring and pleural thickening. Chronic underlying lung changes elsewhere.  No new consolidation. Electronically Signed   By: Karen Kays M.D.   On: 04/11/2023 17:41   (Echo, Carotid, EGD, Colonoscopy, ERCP)    Subjective: No complaints  Discharge Exam: Vitals:   05/01/23 0606 05/01/23 0906  BP: 105/65   Pulse: 63   Resp: 17   Temp: 97.9 F (36.6 C)   SpO2: 98% 97%   Vitals:   04/30/23 1357 04/30/23 1943 05/01/23 0606 05/01/23 0906  BP: 121/80 112/61 105/65   Pulse: 74 71 63   Resp: 16 20 17    Temp: 98.6 F (37 C) 98.3 F (36.8 C) 97.9 F (36.6 C)   TempSrc:   Oral   SpO2: 97% 97% 98% 97%  Height:        General: Pt is alert, awake, not in acute distress Cardiovascular: RRR, S1/S2 +, no rubs, no gallops Respiratory: CTA bilaterally, no wheezing, no rhonchi Abdominal: Soft, NT, ND, bowel sounds + Extremities: no edema, no cyanosis    The results of significant diagnostics from this hospitalization (including imaging, microbiology, ancillary and laboratory) are listed below for reference.     Microbiology: Recent Results (from the past 240 hour(s))  SARS Coronavirus 2 by RT PCR (hospital order,  performed in [facility] hospital lab) *cepheid single result test* Anterior Nasal Swab     Status: None   Collection Time: 04/26/23  3:27 AM   Specimen: Anterior Nasal Swab  Result Value Ref Range Status   SARS Coronavirus 2 by RT PCR NEGATIVE NEGATIVE Final    Comment: (NOTE) SARS-CoV-2 target nucleic acids are NOT DETECTED.  The SARS-CoV-2 RNA is generally detectable in upper and lower respiratory specimens during the acute phase of infection. The lowest concentration of SARS-CoV-2 viral copies this assay can detect is 250 copies / mL. A negative result does not preclude SARS-CoV-2 infection and should not be used as the sole basis for treatment or other patient management decisions.  A negative result may occur with improper specimen collection / handling, submission of specimen other than nasopharyngeal swab, presence of viral mutation(s) within  the areas targeted by this assay, and inadequate number of viral copies (<250 copies / mL). A negative result must be combined with clinical observations, patient history, and epidemiological information.  Fact Sheet for Patients:   RoadLapTop.co.za  Fact Sheet for Healthcare Providers: http://kim-miller.com/  This test is not yet approved or  cleared by the [place] FDA and has been authorized for detection and/or diagnosis of SARS-CoV-2 by FDA under an Emergency Use Authorization (EUA).  This EUA will remain in effect (meaning this test can be used) for the duration of the COVID-19 declaration under Section 564(b)(1) of the Act, 21 U.S.C. section 360bbb-3(b)(1), unless the authorization is terminated or revoked sooner.  Performed at [facility], [address], [place], [place] [address]   Expectorated Sputum Assessment w Gram Stain, Rflx to Resp Cult     Status: None   Collection Time: 04/29/23  7:37 PM  Result Value Ref Range Status   Specimen Description  EXPECTORATED SPUTUM  Final   Special Requests NONE  Final   Sputum evaluation   Final    THIS SPECIMEN IS ACCEPTABLE FOR SPUTUM CULTURE Performed at [facility], [address] [place], [place] [address]    Report Status 04/29/2023 FINAL  Final  Culture, Respiratory w Gram Stain     Status: None (Preliminary result)   Collection Time: 04/29/23  7:37 PM  Result Value Ref Range Status   Specimen Description   Final    EXPECTORATED SPUTUM Performed at [facility], [address] [place], [place] [address]    Special Requests   Final    NONE Reflexed from [id number] Performed at [facility], [address], [place], [place] [address]    Gram Stain   Final    FEW WBC PRESENT,BOTH PMN AND MONONUCLEAR FEW GRAM POSITIVE COCCI IN PAIRS FEW GRAM NEGATIVE RODS FEW GRAM POSITIVE RODS Performed at [facility], [address], [place], [place] [address]    Culture PENDING  Incomplete   Report Status PENDING  Incomplete     Labs: BNP (last 3 results) Recent Labs    03/03/23 1818 03/06/23 1938 04/16/23 2048  BNP 76.4 54.6 114.4*   Basic Metabolic Panel: Recent Labs  Lab 04/26/23 0251 04/28/23 0249 04/29/23 0402  NA 138 133* 135  K 3.5 3.6 4.0  CL 96* 94* 97*  CO2 33* 32 30  GLUCOSE 78 90 170*  BUN 18 22 23   CREATININE 0.51 0.49 0.55  CALCIUM 8.6* 8.5* 8.6*   Liver Function Tests: No results for input(s): "AST", "ALT", "ALKPHOS", "BILITOT", "PROT", "ALBUMIN" in the last 168 hours. No results for input(s): "LIPASE", "AMYLASE" in the last 168 hours. No results for input(s): "AMMONIA" in the last 168 hours. CBC: Recent Labs  Lab 04/26/23 0251 04/28/23 0249 04/29/23 0402  WBC 12.2* 12.1* 11.9*  NEUTROABS 9.7* 7.9*  --   HGB 12.5 13.3 12.1  HCT 40.1 42.3 39.0  MCV 92.6 90.0 89.9  PLT 181 203 190   Cardiac Enzymes: No results for input(s): "CKTOTAL", "CKMB", "CKMBINDEX", "TROPONINI" in the last 168  hours. BNP: Invalid input(s): "POCBNP" CBG: Recent Labs  Lab 04/26/23 0137  GLUCAP 77   D-Dimer No results for input(s): "DDIMER" in the last 72 hours. Hgb A1c No results for input(s): "HGBA1C" in the last 72 hours. Lipid Profile No results for input(s): "CHOL", "HDL", "LDLCALC", "TRIG", "CHOLHDL", "LDLDIRECT" in the last 72 hours. Thyroid function studies No results for  input(s): "TSH", "T4TOTAL", "T3FREE", "THYROIDAB" in the last 72 hours.  Invalid input(s): "FREET3" Anemia work up No results for input(s): "VITAMINB12", "FOLATE", "FERRITIN", "TIBC", "IRON", "RETICCTPCT" in the last 72 hours. Urinalysis    Component Value Date/Time   COLORURINE STRAW (A) 03/11/2022 1743   APPEARANCEUR CLEAR 03/11/2022 1743   LABSPEC 1.006 03/11/2022 1743   PHURINE 8.0 03/11/2022 1743   GLUCOSEU NEGATIVE 03/11/2022 1743   HGBUR NEGATIVE 03/11/2022 1743   BILIRUBINUR NEGATIVE 03/11/2022 1743   KETONESUR NEGATIVE 03/11/2022 1743   PROTEINUR NEGATIVE 03/11/2022 1743   UROBILINOGEN 0.2 11/06/2009 1559   NITRITE NEGATIVE 03/11/2022 1743   LEUKOCYTESUR NEGATIVE 03/11/2022 1743   Sepsis Labs Recent Labs  Lab 04/26/23 0251 04/28/23 0249 04/29/23 0402  WBC 12.2* 12.1* 11.9*   Microbiology Recent Results (from the past 240 hour(s))  SARS Coronavirus 2 by RT PCR (hospital order, performed in [facility] hospital lab) *cepheid single result test* Anterior Nasal Swab     Status: None   Collection Time: 04/26/23  3:27 AM   Specimen: Anterior Nasal Swab  Result Value Ref Range Status   SARS Coronavirus 2 by RT PCR NEGATIVE NEGATIVE Final    Comment: (NOTE) SARS-CoV-2 target nucleic acids are NOT DETECTED.  The SARS-CoV-2 RNA is generally detectable in upper and lower respiratory specimens during the acute phase of infection. The lowest concentration of SARS-CoV-2 viral copies this assay can detect is 250 copies / mL. A negative result does not preclude SARS-CoV-2 infection and should not  be used as the sole basis for treatment or other patient management decisions.  A negative result may occur with improper specimen collection / handling, submission of specimen other than nasopharyngeal swab, presence of viral mutation(s) within the areas targeted by this assay, and inadequate number of viral copies (<250 copies / mL). A negative result must be combined with clinical observations, patient history, and epidemiological information.  Fact Sheet for Patients:   RoadLapTop.co.za  Fact Sheet for Healthcare Providers: http://kim-miller.com/  This test is not yet approved or  cleared by the [place] FDA and has been authorized for detection and/or diagnosis of SARS-CoV-2 by FDA under an Emergency Use Authorization (EUA).  This EUA will remain in effect (meaning this test can be used) for the duration of the COVID-19 declaration under Section 564(b)(1) of the Act, 21 U.S.C. section 360bbb-3(b)(1), unless the authorization is terminated or revoked sooner.  Performed at [facility], [address], [place], [place] [address]   Expectorated Sputum Assessment w Gram Stain, Rflx to Resp Cult     Status: None   Collection Time: 04/29/23  7:37 PM  Result Value Ref Range Status   Specimen Description EXPECTORATED SPUTUM  Final   Special Requests NONE  Final   Sputum evaluation   Final    THIS SPECIMEN IS ACCEPTABLE FOR SPUTUM CULTURE Performed at [facility], [address] [place], [place] [address]    Report Status 04/29/2023 FINAL  Final  Culture, Respiratory w Gram Stain     Status: None (Preliminary result)   Collection Time: 04/29/23  7:37 PM  Result Value Ref Range Status   Specimen Description   Final    EXPECTORATED SPUTUM Performed at [facility], [address], [place], [place] [address]    Special Requests   Final    NONE Reflexed from [id number] Performed  at [facility], [address] [place], [place] [address]    Gram Stain  Final    FEW WBC PRESENT,BOTH PMN AND MONONUCLEAR FEW GRAM POSITIVE COCCI IN PAIRS FEW GRAM NEGATIVE RODS FEW GRAM POSITIVE RODS Performed at [facility], [address], [place], [place] [address]    Culture PENDING  Incomplete   Report Status PENDING  Incomplete    SIGNED:   Marinda Elk, MD  [facility] 05/01/2023, 10:37 AM Pager   If 7PM-7AM, please contact night-coverage [email or web address] Password [email or web address]

## 2023-05-01 NOTE — Progress Notes (Signed)
Physical Therapy Treatment Patient Details Name: Tracy Park MRN: [id number] DOB: [birth year] Today's Date: 05/01/2023   History of Present Illness Patient is a 67 yo female admitted with COPD exac. Hx of COPD-O2 dep 2L, lung ca, sleep apnea, bipolar d/o, PTSD    PT Comments  Pt seen for PT tx with pt agreeable. Pt reports she's been on 2L/min supplemental O2 for a year; pt utilized 2L/min throughout session with SpO2 >90%. Pt is able to ambulate 2 laps around entire unit without AD with mod I with slow steady gait speed; pt reports gait speed is baseline for her. At this time pt has met PT goals & does not require further PT services. PT to d/c pt from acute PT caseload & pt in agreement; encouraged pt to walk with mobility specialists or nursing staff while still here.    If plan is discharge home, recommend the following:     Can travel by private vehicle        Equipment Recommendations  None recommended by PT    Recommendations for Other Services       Precautions / Restrictions Precautions Precautions: None Precaution Comments: O2 dependent at baseline Restrictions Weight Bearing Restrictions: No     Mobility  Bed Mobility Overal bed mobility: Modified Independent (supine>sit)                  Transfers Overall transfer level: Independent Equipment used: None                    Ambulation/Gait Ambulation/Gait assistance: Modified independent (Device/Increase time) Gait Distance (Feet):  (>300 ft)   Gait Pattern/deviations: Step-through pattern, Decreased stride length Gait velocity: decreased; reports this is baseline     General Gait Details: Pt ambulates with BUE held in front of her vs BUE reciprocal arm swing   Stairs             Wheelchair Mobility     Tilt Bed    Modified Rankin (Stroke Patients Only)       Balance Overall balance assessment: Mild deficits observed, not formally tested   Sitting balance-Leahy Scale:  Normal     Standing balance support: During functional activity, No upper extremity supported Standing balance-Leahy Scale: Good                              Cognition Arousal: Alert Behavior During Therapy: WFL for tasks assessed/performed Overall Cognitive Status: Within Functional Limits for tasks assessed                                          Exercises      General Comments General comments (skin integrity, edema, etc.): Pt on 2L/min via nasal cannula throughout session with SpO2 >90%; educated pt on monitoring SpO2 at home.      Pertinent Vitals/Pain Pain Assessment Pain Assessment: No/denies pain    Home Living                          Prior Function            PT Goals (current goals can now be found in the care plan section) Acute Rehab PT Goals Patient Stated Goal: to feel better PT Goal Formulation: With patient Time For Goal Achievement: 05/13/23 Potential  to Achieve Goals: Good Progress towards PT goals: Goals met/education completed, patient discharged from PT    Frequency    Min 1X/week      PT Plan      Co-evaluation              AM-PAC PT "6 Clicks" Mobility   Outcome Measure  Help needed turning from your back to your side while in a flat bed without using bedrails?: None Help needed moving from lying on your back to sitting on the side of a flat bed without using bedrails?: None Help needed moving to and from a bed to a chair (including a wheelchair)?: None Help needed standing up from a chair using your arms (e.g., wheelchair or bedside chair)?: None Help needed to walk in hospital room?: None Help needed climbing 3-5 steps with a railing? : None 6 Click Score: 24    End of Session Equipment Utilized During Treatment: Oxygen Activity Tolerance: Patient tolerated treatment well Patient left: with call bell/phone within reach (standing in room)   PT Visit Diagnosis: Unsteadiness on  feet (R26.81)     Time: 1610-9604 PT Time Calculation (min) (ACUTE ONLY): 15 min  Charges:    $Therapeutic Activity: 8-22 mins PT General Charges $$ ACUTE PT VISIT: 1 Visit                     Aleda Grana, PT, DPT 05/01/23, 10:28 AM   Sandi Mariscal 05/01/2023, 10:27 AM

## 2023-05-01 NOTE — Care Management Important Message (Signed)
Important Message  Patient Details IM Letter given. Name: Tracy Park MRN: [id number] Date of Birth: [birth year]   Medicare Important Message Given:  Yes     Caren Macadam 05/01/2023, 11:05 AM

## 2023-05-01 NOTE — Plan of Care (Signed)

## 2023-05-01 NOTE — Progress Notes (Addendum)
Spoke with pt at bedside about PT recommendation for HHPT. The pt choose [facility] for home services. The [facility] was called awaiting Dr.  orders.   Unable to get Dr. Jarrett Ables for HHPT before pt d/c. Called pt to inform her that she could arrange by calling her insurance company and [facility] for [employer]  05/01/23 1416  TOC Discharge Assessment  Final next level of care Home w Home Health Services  Once discharged, how will the patient get to their discharge location? Family/Friend - Partnered Transport  Has discharge transport plan been identified? Yes  Barriers to Discharge Other (must enter comment)  Patient states their goals for this hospitalization and ongoing recovery are: get better  CMS Medicare.gov Compare Post Acute Care list provided to: Patient  Choice offered to / list presented to  Patient  [place] ownership interest in [facility]provided to: Patient  HH Arranged  (NCM)  HH Agency [facility]  Date [facility] Agency Contacted 05/01/23  Time HH Agency Contacted 1300  Representative spoke with at [facility] [place]  Patient chooses bed at  ([facility])  Patient and family notified of of transfer 05/01/23

## 2023-05-02 LAB — CULTURE, RESPIRATORY W GRAM STAIN

## 2023-06-04 ENCOUNTER — Emergency Department (HOSPITAL_COMMUNITY): Payer: 59

## 2023-06-04 ENCOUNTER — Encounter (HOSPITAL_COMMUNITY): Payer: Self-pay | Admitting: Internal Medicine

## 2023-06-04 ENCOUNTER — Other Ambulatory Visit: Payer: Self-pay

## 2023-06-04 ENCOUNTER — Inpatient Hospital Stay (HOSPITAL_COMMUNITY)
Admission: EM | Admit: 2023-06-04 | Discharge: 2023-06-06 | DRG: 191 | Disposition: A | Discharge status: Home / Self Care | Payer: 59 | Attending: Internal Medicine | Admitting: Internal Medicine

## 2023-06-04 DIAGNOSIS — Z87442 Personal history of urinary calculi: Secondary | ICD-10-CM

## 2023-06-04 DIAGNOSIS — I700 Atherosclerosis of aorta: Secondary | ICD-10-CM | POA: Diagnosis present

## 2023-06-04 DIAGNOSIS — Z8249 Family history of ischemic heart disease and other diseases of the circulatory system: Secondary | ICD-10-CM

## 2023-06-04 DIAGNOSIS — Z66 Do not resuscitate: Secondary | ICD-10-CM | POA: Diagnosis present

## 2023-06-04 DIAGNOSIS — Z833 Family history of diabetes mellitus: Secondary | ICD-10-CM

## 2023-06-04 DIAGNOSIS — Z7901 Long term (current) use of anticoagulants: Secondary | ICD-10-CM

## 2023-06-04 DIAGNOSIS — F419 Anxiety disorder, unspecified: Secondary | ICD-10-CM | POA: Diagnosis present

## 2023-06-04 DIAGNOSIS — E785 Hyperlipidemia, unspecified: Secondary | ICD-10-CM | POA: Diagnosis present

## 2023-06-04 DIAGNOSIS — Z886 Allergy status to analgesic agent status: Secondary | ICD-10-CM

## 2023-06-04 DIAGNOSIS — Z85118 Personal history of other malignant neoplasm of bronchus and lung: Secondary | ICD-10-CM | POA: Diagnosis not present

## 2023-06-04 DIAGNOSIS — I480 Paroxysmal atrial fibrillation: Secondary | ICD-10-CM | POA: Diagnosis present

## 2023-06-04 DIAGNOSIS — E43 Unspecified severe protein-calorie malnutrition: Secondary | ICD-10-CM | POA: Diagnosis present

## 2023-06-04 DIAGNOSIS — J9610 Chronic respiratory failure, unspecified whether with hypoxia or hypercapnia: Secondary | ICD-10-CM | POA: Diagnosis present

## 2023-06-04 DIAGNOSIS — Z23 Encounter for immunization: Secondary | ICD-10-CM | POA: Diagnosis present

## 2023-06-04 DIAGNOSIS — Z885 Allergy status to narcotic agent status: Secondary | ICD-10-CM

## 2023-06-04 DIAGNOSIS — Z9981 Dependence on supplemental oxygen: Secondary | ICD-10-CM | POA: Diagnosis not present

## 2023-06-04 DIAGNOSIS — J449 Chronic obstructive pulmonary disease, unspecified: Secondary | ICD-10-CM | POA: Diagnosis present

## 2023-06-04 DIAGNOSIS — R54 Age-related physical debility: Secondary | ICD-10-CM | POA: Diagnosis present

## 2023-06-04 DIAGNOSIS — J4489 Other specified chronic obstructive pulmonary disease: Secondary | ICD-10-CM | POA: Diagnosis present

## 2023-06-04 DIAGNOSIS — J418 Mixed simple and mucopurulent chronic bronchitis: Secondary | ICD-10-CM

## 2023-06-04 DIAGNOSIS — Z5941 Food insecurity: Secondary | ICD-10-CM | POA: Diagnosis present

## 2023-06-04 DIAGNOSIS — F32A Depression, unspecified: Secondary | ICD-10-CM | POA: Diagnosis present

## 2023-06-04 DIAGNOSIS — Z90710 Acquired absence of both cervix and uterus: Secondary | ICD-10-CM

## 2023-06-04 DIAGNOSIS — R64 Cachexia: Secondary | ICD-10-CM | POA: Diagnosis present

## 2023-06-04 DIAGNOSIS — K219 Gastro-esophageal reflux disease without esophagitis: Secondary | ICD-10-CM | POA: Diagnosis present

## 2023-06-04 DIAGNOSIS — D72829 Elevated white blood cell count, unspecified: Secondary | ICD-10-CM | POA: Diagnosis present

## 2023-06-04 DIAGNOSIS — Z902 Acquired absence of lung [part of]: Secondary | ICD-10-CM

## 2023-06-04 DIAGNOSIS — Z888 Allergy status to other drugs, medicaments and biological substances status: Secondary | ICD-10-CM

## 2023-06-04 DIAGNOSIS — I252 Old myocardial infarction: Secondary | ICD-10-CM

## 2023-06-04 DIAGNOSIS — I2510 Atherosclerotic heart disease of native coronary artery without angina pectoris: Secondary | ICD-10-CM | POA: Diagnosis present

## 2023-06-04 DIAGNOSIS — J441 Chronic obstructive pulmonary disease with (acute) exacerbation: Principal | ICD-10-CM

## 2023-06-04 DIAGNOSIS — E039 Hypothyroidism, unspecified: Secondary | ICD-10-CM | POA: Diagnosis present

## 2023-06-04 DIAGNOSIS — C3412 Malignant neoplasm of upper lobe, left bronchus or lung: Secondary | ICD-10-CM | POA: Diagnosis present

## 2023-06-04 DIAGNOSIS — Z79890 Hormone replacement therapy (postmenopausal): Secondary | ICD-10-CM

## 2023-06-04 DIAGNOSIS — F17210 Nicotine dependence, cigarettes, uncomplicated: Secondary | ICD-10-CM | POA: Diagnosis present

## 2023-06-04 DIAGNOSIS — Z720 Tobacco use: Secondary | ICD-10-CM | POA: Diagnosis present

## 2023-06-04 DIAGNOSIS — F319 Bipolar disorder, unspecified: Secondary | ICD-10-CM | POA: Diagnosis present

## 2023-06-04 DIAGNOSIS — I10 Essential (primary) hypertension: Secondary | ICD-10-CM | POA: Diagnosis present

## 2023-06-04 DIAGNOSIS — Z79899 Other long term (current) drug therapy: Secondary | ICD-10-CM

## 2023-06-04 LAB — CBC
HCT: 41.8 % (ref 36.0–46.0)
Hemoglobin: 12.8 g/dL (ref 12.0–15.0)
MCH: 28.4 pg (ref 26.0–34.0)
MCHC: 30.6 g/dL (ref 30.0–36.0)
MCV: 92.7 fL (ref 80.0–100.0)
Platelets: 232 10*3/uL (ref 150–400)
RBC: 4.51 MIL/uL (ref 3.87–5.11)
RDW: 14.7 % (ref 11.5–15.5)
WBC: 7.9 10*3/uL (ref 4.0–10.5)
nRBC: 0 % (ref 0.0–0.2)

## 2023-06-04 LAB — BASIC METABOLIC PANEL
Anion gap: 11 (ref 5–15)
BUN: 9 mg/dL (ref 8–23)
CO2: 32 mmol/L (ref 22–32)
Calcium: 9.5 mg/dL (ref 8.9–10.3)
Chloride: 97 mmol/L — ABNORMAL LOW (ref 98–111)
Creatinine, Ser: 0.47 mg/dL (ref 0.44–1.00)
GFR, Estimated: >60 mL/min (ref >60)
Glucose, Bld: 138 mg/dL — ABNORMAL HIGH (ref 70–99)
Potassium: 3.7 mmol/L (ref 3.5–5.1)
Sodium: 140 mmol/L (ref 135–145)

## 2023-06-04 MED ORDER — ONDANSETRON HCL 4 MG/2ML IJ SOLN: 4.0000 mg | Freq: Four times a day (QID) | INTRAMUSCULAR | Status: DC | PRN

## 2023-06-04 MED ORDER — ONDANSETRON HCL 4 MG PO TABS: 4.0000 mg | ORAL_TABLET | Freq: Four times a day (QID) | ORAL | Status: DC | PRN

## 2023-06-04 MED ORDER — TRAZODONE HCL 50 MG PO TABS
25.0000 mg | ORAL_TABLET | Freq: Every evening | ORAL | Status: DC | PRN
  Administered 2023-06-04 – 2023-06-05 (×2): 25 mg via ORAL
  Filled 2023-06-04 (×2): qty 1

## 2023-06-04 MED ORDER — PANTOPRAZOLE SODIUM 40 MG PO TBEC
40.0000 mg | DELAYED_RELEASE_TABLET | Freq: Every day | ORAL | Status: DC
  Administered 2023-06-05: 40 mg via ORAL
  Filled 2023-06-04: qty 1

## 2023-06-04 MED ORDER — METHYLPREDNISOLONE SODIUM SUCC 125 MG IJ SOLR: 125.0000 mg | Freq: Once | INTRAMUSCULAR | Status: DC

## 2023-06-04 MED ORDER — ALBUTEROL SULFATE (2.5 MG/3ML) 0.083% IN NEBU
7.5000 mg/h | INHALATION_SOLUTION | Freq: Once | RESPIRATORY_TRACT | Status: AC
  Administered 2023-06-04: 7.5 mg/h via RESPIRATORY_TRACT
  Filled 2023-06-04: qty 9
  Filled 2023-06-04: qty 3

## 2023-06-04 MED ORDER — METOPROLOL SUCCINATE ER 25 MG PO TB24
12.5000 mg | ORAL_TABLET | Freq: Every day | ORAL | Status: DC
  Administered 2023-06-05 – 2023-06-06 (×2): 12.5 mg via ORAL
  Filled 2023-06-04 (×4): qty 1

## 2023-06-04 MED ORDER — LEVOTHYROXINE SODIUM 25 MCG PO TABS
125.0000 ug | ORAL_TABLET | Freq: Every day | ORAL | Status: DC
  Administered 2023-06-05 – 2023-06-06 (×2): 125 ug via ORAL
  Filled 2023-06-04 (×2): qty 1

## 2023-06-04 MED ORDER — APIXABAN 5 MG PO TABS
5.0000 mg | ORAL_TABLET | Freq: Two times a day (BID) | ORAL | Status: DC
  Administered 2023-06-04 – 2023-06-06 (×4): 5 mg via ORAL
  Filled 2023-06-04 (×4): qty 1

## 2023-06-04 MED ORDER — METHYLPREDNISOLONE SODIUM SUCC 40 MG IJ SOLR
40.0000 mg | Freq: Two times a day (BID) | INTRAMUSCULAR | Status: DC
  Administered 2023-06-04 – 2023-06-05 (×2): 40 mg via INTRAVENOUS
  Filled 2023-06-04 (×2): qty 1

## 2023-06-04 MED ORDER — INFLUENZA VAC A&B SURF ANT ADJ 0.5 ML IM SUSY
0.5000 mL | PREFILLED_SYRINGE | INTRAMUSCULAR | Status: AC
  Administered 2023-06-06: 0.5 mL via INTRAMUSCULAR
  Filled 2023-06-04: qty 0.5

## 2023-06-04 MED ORDER — ACETAMINOPHEN 325 MG PO TABS: 650.0000 mg | ORAL_TABLET | Freq: Four times a day (QID) | ORAL | Status: DC | PRN

## 2023-06-04 MED ORDER — IPRATROPIUM-ALBUTEROL 0.5-2.5 (3) MG/3ML IN SOLN
3.0000 mL | Freq: Four times a day (QID) | RESPIRATORY_TRACT | Status: DC
  Administered 2023-06-04 – 2023-06-05 (×2): 3 mL via RESPIRATORY_TRACT
  Filled 2023-06-04 (×2): qty 3

## 2023-06-04 MED ORDER — METHOCARBAMOL 500 MG PO TABS
500.0000 mg | ORAL_TABLET | Freq: Four times a day (QID) | ORAL | Status: AC | PRN
  Administered 2023-06-04: 500 mg via ORAL
  Filled 2023-06-04: qty 1

## 2023-06-04 MED ORDER — ALBUTEROL SULFATE (2.5 MG/3ML) 0.083% IN NEBU
2.5000 mg | INHALATION_SOLUTION | RESPIRATORY_TRACT | Status: DC | PRN
  Administered 2023-06-06: 2.5 mg via RESPIRATORY_TRACT
  Filled 2023-06-04: qty 3

## 2023-06-04 MED ORDER — ACETAMINOPHEN 650 MG RE SUPP: 650.0000 mg | Freq: Four times a day (QID) | RECTAL | Status: DC | PRN

## 2023-06-04 MED ORDER — TAMSULOSIN HCL 0.4 MG PO CAPS: 0.4000 mg | ORAL_CAPSULE | Freq: Every day | ORAL | Status: DC

## 2023-06-04 MED ORDER — ALPRAZOLAM 0.5 MG PO TABS: 0.5000 mg | ORAL_TABLET | Freq: Two times a day (BID) | ORAL | Status: DC | PRN

## 2023-06-04 NOTE — ED Provider Notes (Signed)
[place] EMERGENCY DEPARTMENT AT [facility] Provider Note   CSN: [id number] Arrival date & time: 06/04/23  1228     History  Chief Complaint  Patient presents with   Shortness of Breath    Astra L Blackwater is a 67 y.o. female.  This is a 67 year old female presented to the emergency department with chief complaint of shortness of breath.  History of COPD on 2 L of oxygen at baseline.  Sudden onset shortness of breath today.  Has productive cough, however unchanged sputum color or amount.  No chest pain no shortness of breath.  No nausea vomiting diarrhea.  Does note history of stage III lung cancer in remission.   Shortness of Breath      Home Medications Prior to Admission medications   Medication Sig Start Date End Date Taking? Authorizing Provider  albuterol (PROVENTIL) (2.5 MG/3ML) 0.083% nebulizer solution Take 3 mLs (2.5 mg total) by nebulization every 6 (six) hours as needed for wheezing or shortness of breath. Patient taking differently: Take 2.5 mg by nebulization See admin instructions. Nebulize 2.5 mg and inhale into the lungs in the morning and at bedtime 01/30/23   Tilden Fossa, MD  albuterol (VENTOLIN HFA) 108 (90 Base) MCG/ACT inhaler Inhale 2 puffs into the lungs every 6 (six) hours as needed for wheezing or shortness of breath. 01/13/23   Burnadette Pop, MD  ALPRAZolam Prudy Feeler) 0.5 MG tablet Take 0.5 mg by mouth 2 (two) times daily as needed for anxiety. 03/30/23   [provider]  apixaban (ELIQUIS) 5 MG TABS tablet Take 5 mg by mouth 2 (two) times daily.    [provider]  Budeson-Glycopyrrol-Formoterol (BREZTRI AEROSPHERE) 160-9-4.8 MCG/ACT AERO Inhale 1 puff into the lungs in the morning and at bedtime.    [provider]  levothyroxine (SYNTHROID) 125 MCG tablet Take 125 mcg by mouth daily before breakfast.    [provider]  Magnesium Oxide 400 MG CAPS Take 1 capsule (400 mg total) by mouth daily at 12  noon. Patient not taking: Reported on 04/28/2023 03/01/22   Albertine Grates, MD  metoprolol succinate (TOPROL-XL) 25 MG 24 hr tablet Take 12.5 mg by mouth daily.    [provider]  nitroGLYCERIN (NITROSTAT) 0.4 MG SL tablet Place 0.4 mg under the tongue every 5 (five) minutes as needed for chest pain.    [provider]  OXYGEN Inhale 2 L/min into the lungs continuous.    [provider]  pantoprazole (PROTONIX) 40 MG tablet Take 40 mg by mouth daily before breakfast.    [provider]  predniSONE (STERAPRED UNI-PAK 21 TAB) 10 MG (21) TBPK tablet Take as directed on package 05/01/23   Marinda Elk, MD  rosuvastatin (CRESTOR) 20 MG tablet Take 20 mg by mouth daily.    [provider]  SYSTANE COMPLETE PF 0.6 % SOLN Place 1 drop into both eyes 3 (three) times daily as needed (for dryness).    [provider]  tamsulosin (FLOMAX) 0.4 MG CAPS capsule Take 0.4 mg by mouth daily.    [provider]      Allergies    Red dye #40 (allura red), Oxycontin [oxycodone hcl], Aspirin, Strawberry extract, Tomato, Tape, and Wound dressing adhesive    Review of Systems   Review of Systems  Respiratory:  Positive for shortness of breath.     Physical Exam Updated Vital Signs BP 95/74   Pulse (!) 104   Temp 98 F (36.7 C) (  Oral)   Resp (!) 23   SpO2 96%  Physical Exam Vitals and nursing note reviewed.  HENT:     Head: Normocephalic.  Cardiovascular:     Rate and Rhythm: Normal rate and regular rhythm.  Pulmonary:     Effort: Accessory muscle usage (mild) present.     Breath sounds: Wheezing present.     Comments: Maintaining oxygen saturation on home 2 L.  Some minor accessory muscle use.  Diffuse wheezing throughout all lung fields. Chest:     Chest wall: No tenderness.  Musculoskeletal:     Cervical back: Normal range of motion.     Right lower leg: No edema.     Left lower leg: No edema.  Skin:    General: Skin is warm and  dry.     Capillary Refill: Capillary refill takes less than 2 seconds.  Neurological:     Mental Status: She is alert and oriented to person, place, and time.  Psychiatric:        Mood and Affect: Mood normal.        Behavior: Behavior normal.     ED Results / Procedures / Treatments   Labs (all labs ordered are listed, but only abnormal results are displayed) Labs Reviewed  BASIC METABOLIC PANEL - Abnormal; Notable for the following components:      Result Value   Chloride 97 (*)    Glucose, Bld 138 (*)    All other components within normal limits  CBC    EKG EKG Interpretation Date/Time:  Wednesday June 04 2023 12:36:46 EDT Ventricular Rate:  100 PR Interval:  137 QRS Duration:  106 QT Interval:  357 QTC Calculation: 461 R Axis:   -61  Text Interpretation: Sinus tachycardia Right atrial enlargement LVH with secondary repolarization abnormality Confirmed by Estanislado Pandy [phone]) on 06/04/2023 1:16:38 PM  Radiology No results found.  Procedures Procedures    Medications Ordered in ED Medications  albuterol (PROVENTIL) (2.5 MG/3ML) 0.083% nebulizer solution (7.5 mg/hr Nebulization Given 06/04/23 1322)    ED Course/ Medical Decision Making/ A&P Clinical Course as of 06/04/23 1434  Wed Jun 04, 2023  1416 Reevaluated patient; appears the tubing had come off breathing apparatus and she is not finished her breathing treatment.  Mild improvement of her symptoms.  Will reevaluate [TY]    Clinical Course User Index [TY] Coral Spikes, DO                                 Medical Decision Making 67 year old female present emergency department for shortness of breath.  History of COPD on 2 L of oxygen.  Does not appear to be in overt respiratory distress that she is maintaining oxygen saturation on her home 2 L.  Received 2 DuoNebs en route with EMS per triage report.  However, no documentation for steroid.  Patient asked and reports that they did give steroids.   Patient given hour-long breathing treatment here, still complains of tightness.  Continues to have diffuse wheezing and subjective shortness of breath.  Given patient's age and risk factors will admit for COPD exacerbation.  Amount and/or Complexity of Data Reviewed External Data Reviewed: notes.    Details: Per chart review was discharged 04/28/2023 for COPD exacerbation per discharge summary:Discharge Diagnoses:  Principal Problem:   COPD with acute exacerbation (HCC)   Acute on chronic respiratory failure with hypoxia secondary to COPD exacerbation: She was started  on steroids and antibiotics. We were able to wean her down to oxygen she uses at home which is 2 L. She continue steroids and antibiotics as an outpatient. She will continue inhalers as an outpatient.   Essentialhypertension: She is currently normotensive in the hospital she was discharged off losartan. She will follow-up with PCP as an outpatient resume if needed.   Paroxysmal atrial fibrillation: No changes made to his medication continue metoprolol and Eliquis.   Leukocytosis: Likely due to steroids.   Anxiety: Continue Xanax. " Labs: ordered. Decision-making details documented in ED Course. Radiology: ordered. Decision-making details documented in ED Course. ECG/medicine tests: independent interpretation performed.    Details: Sinus tachycardia, no ST segment changes to indicate ischemia.  Normal intervals. Discussion of management or test interpretation with external provider(s): Case discussed with hospitalist.   Risk Decision regarding hospitalization.          Final Clinical Impression(s) / ED Diagnoses Final diagnoses:  None    Rx / DC Orders ED Discharge Orders     None         Coral Spikes, DO 06/04/23 1434

## 2023-06-04 NOTE — ED Triage Notes (Signed)
[facility] reports pt coming from home for sob. Hx of COPD. EMS gave two duo nebs enrouts. Pt is normally on 2L O2 all the time.

## 2023-06-04 NOTE — ED Notes (Signed)
ED TO INPATIENT HANDOFF REPORT  ED Nurse Name and Phone #: Suann Larry Name/Age/Gender Tracy Park 67 y.o. female Room/Bed: WA18/WA18  Code Status   Code Status: Do not attempt resuscitation (DNR) PRE-ARREST INTERVENTIONS DESIRED  Home/SNF/Other Home Patient oriented to: self, place, time, and situation Is this baseline? Yes   Triage Complete: Triage complete  Chief Complaint COPD with acute exacerbation [facility]) [J44.1]  Triage Note [facility] reports pt coming from home for sob. Hx of COPD. EMS gave two duo nebs enrouts. Pt is normally on 2L O2 all the time.   Allergies Allergies  Allergen Reactions   Red Dye #40 (Allura Red) Hives, Itching and Other (See Comments)    Red food dye   Oxycontin [Oxycodone Hcl] Other (See Comments)    Hallucinations    Aspirin Hives   Strawberry Extract Hives and Itching   Tomato Hives and Itching   Tape Rash and Other (See Comments)    Prefers paper tape   Wound Dressing Adhesive Rash    Level of Care/Admitting Diagnosis ED Disposition     ED Disposition  Admit   Condition  --   Comment  Hospital Area: [facility] [[id number]]  Level of Care: Med-Surg [16]  May admit patient to [facility] or [facility] if equivalent level of care is available:: Yes  Covid Evaluation: Asymptomatic - no recent exposure (last 10 days) testing not required  Diagnosis: COPD with acute exacerbation [facility]) [[id number]]  Admitting Physician: Maryln Gottron [[id number]]  Attending Physician: Olexa.Dam, MIR Jaxson.Roy [[id number]]  Certification:: I certify this patient will need inpatient services for at least 2 midnights  Expected Medical Readiness: 06/06/2023          B Medical/Surgery History Past Medical History:  Diagnosis Date   Anginal pain (HCC)    Anxiety    Bipolar disorder (HCC)    Cancer (HCC)    COPD (chronic obstructive pulmonary disease) (HCC)    Dyspnea    Family history of adverse reaction to anesthesia    History of kidney  stones    Hydroureteronephrosis 08/16/2021   Hypothyroidism    Lung cancer (HCC)    Myocardial infarction (HCC)    Paroxysmal atrial fibrillation (HCC)    PTSD (post-traumatic stress disorder)    Sleep apnea    Thyroid disease    Past Surgical History:  Procedure Laterality Date   ABDOMINAL HYSTERECTOMY     BACK SURGERY     CYSTOSCOPY W/ URETERAL STENT PLACEMENT Right 05/03/2021   Procedure: CYSTOSCOPY WITH RETROGRADE PYELOGRAM/URETERAL STENT PLACEMENT;  Surgeon: Crist Fat, MD;  Location: [facility];  Service: Urology;  Laterality: Right;   EYE SURGERY     kidney stent     thyroidectomy       A IV Location/Drains/Wounds Patient Lines/Drains/Airways Status     Active Line/Drains/Airways     Name Placement date Placement time Site Days   Peripheral IV 06/04/23 20 G 1" Left;Posterior Hand 06/04/23  1234  Hand  less than 1            Intake/Output Last 24 hours No intake or output data in the 24 hours ending 06/04/23 1518  Labs/Imaging Results for orders placed or performed during the hospital encounter of 06/04/23 (from the past 48 hour(s))  CBC     Status: None   Collection Time: 06/04/23  1:13 PM  Result Value Ref Range   WBC 7.9 4.0 - 10.5 K/uL   RBC 4.51 3.87 -  5.11 MIL/uL   Hemoglobin 12.8 12.0 - 15.0 g/dL   HCT 09.8 11.9 - 14.7 %   MCV 92.7 80.0 - 100.0 fL   MCH 28.4 26.0 - 34.0 pg   MCHC 30.6 30.0 - 36.0 g/dL   RDW 82.9 56.2 - 13.0 %   Platelets 232 150 - 400 K/uL   nRBC 0.0 0.0 - 0.2 %    Comment: Performed at [facility], [address] [place], [place] [address]  Basic metabolic panel     Status: Abnormal   Collection Time: 06/04/23  1:13 PM  Result Value Ref Range   Sodium 140 135 - 145 mmol/L   Potassium 3.7 3.5 - 5.1 mmol/L   Chloride 97 (L) 98 - 111 mmol/L   CO2 32 22 - 32 mmol/L   Glucose, Bld 138 (H) 70 - 99 mg/dL    Comment: Glucose reference range applies only to samples taken after fasting for at least 8 hours.    BUN 9 8 - 23 mg/dL   Creatinine, Ser 4.69 0.44 - 1.00 mg/dL   Calcium 9.5 8.9 - 62.9 mg/dL   GFR, Estimated >52 >84 mL/min    Comment: (NOTE) Calculated using the CKD-EPI Creatinine Equation (2021)    Anion gap 11 5 - 15    Comment: Performed at [facility], [address], [place], [place] [address]   No results found.  Pending Labs Unresulted Labs (From admission, onward)     Start     Ordered   06/05/23 0500  Basic metabolic panel  Tomorrow morning,   R        06/04/23 1508   06/05/23 0500  CBC  Tomorrow morning,   R        06/04/23 1508            Vitals/Pain Today's Vitals   06/04/23 1235 06/04/23 1237 06/04/23 1330 06/04/23 1400  BP:  (!) 152/66 115/74 95/74  Pulse:  (!) 102 99 (!) 104  Resp:  20 (!) 23 (!) 23  Temp:  98 F (36.7 C)    TempSrc:  Oral    SpO2:  98% 99% 96%  PainSc: (S) 8        Isolation Precautions No active isolations  Medications Medications  metoprolol succinate (TOPROL-XL) 24 hr tablet 12.5 mg (has no administration in time range)  ALPRAZolam (XANAX) tablet 0.5 mg (has no administration in time range)  levothyroxine (SYNTHROID) tablet 125 mcg (has no administration in time range)  pantoprazole (PROTONIX) EC tablet 40 mg (has no administration in time range)  tamsulosin (FLOMAX) capsule 0.4 mg (has no administration in time range)  apixaban (ELIQUIS) tablet 5 mg (has no administration in time range)  ipratropium-albuterol (DUONEB) 0.5-2.5 (3) MG/3ML nebulizer solution 3 mL (has no administration in time range)  methylPREDNISolone sodium succinate (SOLU-MEDROL) 40 mg/mL injection 40 mg (has no administration in time range)  acetaminophen (TYLENOL) tablet 650 mg (has no administration in time range)    Or  acetaminophen (TYLENOL) suppository 650 mg (has no administration in time range)  traZODone (DESYREL) tablet 25 mg (has no administration in time range)  ondansetron (ZOFRAN) tablet 4 mg (has no administration  in time range)    Or  ondansetron (ZOFRAN) injection 4 mg (has no administration in time range)  albuterol (PROVENTIL) (2.5 MG/3ML) 0.083% nebulizer solution 2.5 mg (has no administration in time range)  albuterol (PROVENTIL) (2.5 MG/3ML) 0.083% nebulizer solution (7.5 mg/hr Nebulization Given 06/04/23 1322)  Mobility walks     Focused Assessments Cardiac Assessment Handoff:    Lab Results  Component Value Date   CKTOTAL 101 05/02/2021   CKMB 1.8 04/28/2012   TROPONINI <0.30 04/28/2012   Lab Results  Component Value Date   DDIMER 0.42 04/16/2023   Does the Patient currently have chest pain? No    R Recommendations: See Admitting Provider Note  Report given to:   Additional Notes:

## 2023-06-04 NOTE — H&P (Signed)
History and Physical  Tracy Park UJW:[id number] DOB: Jan 19, 1956 DOA: 06/04/2023  PCP: Jacquelin Hawking, PA-C   Chief Complaint: Cough, shortness of breath  HPI: Tracy Park is a 67 y.o. female with medical history significant for bipolar disorder, COPD chronically on 2 L nasal cannula oxygen, paroxysmal atrial fibrillation on Eliquis, PTSD and multiple hospital admissions for COPD exacerbation being admitted to the hospital with recurrent acute exacerbation of COPD.  She started having severe shortness of breath early this morning about 2 AM, with cough, chest tightness.  Denies any fevers, or sputum production.  No nausea, vomiting, or other symptoms.  She was brought to the ER by EMS, who gave her 2 DuoNeb's en route, as well as 125 mg IV Solu-Medrol.  Patient has remained stable on her 2 L nasal cannula oxygen, but has continued dyspnea, tachypnea.  ED Course: In the emergency department, she has been afebrile, heart rate about 102, normal blood pressure, saturating well on 2 L nasal cannula oxygen.  Lab work including CBC and BMP unremarkable.  Chest x-ray was obtained, radiology interpretation is pending.  Review of Systems: Please see HPI for pertinent positives and negatives. A complete 10 system review of systems are otherwise negative.  Past Medical History:  Diagnosis Date   Anginal pain (HCC)    Anxiety    Bipolar disorder (HCC)    Cancer (HCC)    COPD (chronic obstructive pulmonary disease) (HCC)    Dyspnea    Family history of adverse reaction to anesthesia    History of kidney stones    Hydroureteronephrosis 08/16/2021   Hypothyroidism    Lung cancer (HCC)    Myocardial infarction (HCC)    Paroxysmal atrial fibrillation (HCC)    PTSD (post-traumatic stress disorder)    Sleep apnea    Thyroid disease    Past Surgical History:  Procedure Laterality Date   ABDOMINAL HYSTERECTOMY     BACK SURGERY     CYSTOSCOPY W/ URETERAL STENT PLACEMENT Right 05/03/2021    Procedure: CYSTOSCOPY WITH RETROGRADE PYELOGRAM/URETERAL STENT PLACEMENT;  Surgeon: Crist Fat, MD;  Location: [facility];  Service: Urology;  Laterality: Right;   EYE SURGERY     kidney stent     thyroidectomy      Social History:  reports that she has been smoking cigarettes. She has never used smokeless tobacco. She reports that she does not currently use alcohol. She reports that she does not currently use drugs.   Allergies  Allergen Reactions   Red Dye #40 (Allura Red) Hives, Itching and Other (See Comments)    Red food dye   Oxycontin [Oxycodone Hcl] Other (See Comments)    Hallucinations    Aspirin Hives   Strawberry Extract Hives and Itching   Tomato Hives and Itching   Tape Rash and Other (See Comments)    Prefers paper tape   Wound Dressing Adhesive Rash    Family History  Problem Relation Age of Onset   Hypertension Mother    Heart failure Mother    Hypertension Father    Diabetes Father    Heart failure Father      Prior to Admission medications   Medication Sig Start Date End Date Taking? Authorizing Provider  albuterol (PROVENTIL) (2.5 MG/3ML) 0.083% nebulizer solution Take 3 mLs (2.5 mg total) by nebulization every 6 (six) hours as needed for wheezing or shortness of breath. Patient taking differently: Take 2.5 mg by nebulization See admin instructions. Nebulize 2.5 mg and inhale  into the lungs in the morning and at bedtime 01/30/23   Tilden Fossa, MD  albuterol (VENTOLIN HFA) 108 (90 Base) MCG/ACT inhaler Inhale 2 puffs into the lungs every 6 (six) hours as needed for wheezing or shortness of breath. 01/13/23   Burnadette Pop, MD  ALPRAZolam Prudy Feeler) 0.5 MG tablet Take 0.5 mg by mouth 2 (two) times daily as needed for anxiety. 03/30/23   [provider]  apixaban (ELIQUIS) 5 MG TABS tablet Take 5 mg by mouth 2 (two) times daily.    [provider]  Budeson-Glycopyrrol-Formoterol (BREZTRI AEROSPHERE) 160-9-4.8 MCG/ACT AERO Inhale 1 puff  into the lungs in the morning and at bedtime.    [provider]  levothyroxine (SYNTHROID) 125 MCG tablet Take 125 mcg by mouth daily before breakfast.    [provider]  Magnesium Oxide 400 MG CAPS Take 1 capsule (400 mg total) by mouth daily at 12 noon. Patient not taking: Reported on 04/28/2023 03/01/22   Albertine Grates, MD  metoprolol succinate (TOPROL-XL) 25 MG 24 hr tablet Take 12.5 mg by mouth daily.    [provider]  nitroGLYCERIN (NITROSTAT) 0.4 MG SL tablet Place 0.4 mg under the tongue every 5 (five) minutes as needed for chest pain.    [provider]  OXYGEN Inhale 2 L/min into the lungs continuous.    [provider]  pantoprazole (PROTONIX) 40 MG tablet Take 40 mg by mouth daily before breakfast.    [provider]  predniSONE (STERAPRED UNI-PAK 21 TAB) 10 MG (21) TBPK tablet Take as directed on package 05/01/23   Marinda Elk, MD  rosuvastatin (CRESTOR) 20 MG tablet Take 20 mg by mouth daily.    [provider]  SYSTANE COMPLETE PF 0.6 % SOLN Place 1 drop into both eyes 3 (three) times daily as needed (for dryness).    [provider]  tamsulosin (FLOMAX) 0.4 MG CAPS capsule Take 0.4 mg by mouth daily.    [provider]    Physical Exam: BP 95/74   Pulse (!) 104   Temp 98 F (36.7 C) (Oral)   Resp (!) 23   SpO2 96%   General:  Alert, oriented, calm, in no acute distress, looks comfortable, thin and emaciated Eyes: EOMI, clear conjuctivae, white sclerea Neck: supple, no masses, trachea mildline  Cardiovascular: RRR, no murmurs or rubs, no peripheral edema  Respiratory: Breath sounds are diminished globally, especially in the right upper lung field, diffuse rhonchi and wheezing, no significant tachypnea, no retractions, stridor or other evidence of respiratory distress Abdomen: soft, nontender, nondistended, normal bowel tones heard  Skin: dry, no rashes  Musculoskeletal: no joint  effusions, normal range of motion  Psychiatric: appropriate affect, normal speech  Neurologic: extraocular muscles intact, clear speech, moving all extremities with intact sensorium         Labs on Admission:  Basic Metabolic Panel: Recent Labs  Lab 06/04/23 1313  NA 140  K 3.7  CL 97*  CO2 32  GLUCOSE 138*  BUN 9  CREATININE 0.47  CALCIUM 9.5   Liver Function Tests: No results for input(s): "AST", "ALT", "ALKPHOS", "BILITOT", "PROT", "ALBUMIN" in the last 168 hours. No results for input(s): "LIPASE", "AMYLASE" in the last 168 hours. No results for input(s): "AMMONIA" in the last 168 hours. CBC: Recent Labs  Lab 06/04/23 1313  WBC 7.9  HGB 12.8  HCT 41.8  MCV 92.7  PLT 232   Cardiac Enzymes: No results for input(s): "CKTOTAL", "CKMB", "CKMBINDEX", "  TROPONINI" in the last 168 hours.  BNP (last 3 results) Recent Labs    03/03/23 1818 03/06/23 1938 04/16/23 2048  BNP 76.4 54.6 114.4*    ProBNP (last 3 results) No results for input(s): "PROBNP" in the last 8760 hours.  CBG: No results for input(s): "GLUCAP" in the last 168 hours.  Radiological Exams on Admission: No results found.  Assessment/Plan This is a pleasant 67 year old female with a history of ongoing tobacco abuse, hypertension, hypothyroidism, paroxysmal atrial fibrillation on Eliquis, COPD chronically on 2 L nasal cannula oxygen being admitted to the hospital for the seventh time this year for acute exacerbation of COPD.  During her last admission, it was felt that her exacerbation was likely due to medication noncompliance.  She now states she has been taking all of her medications, as well as her nebulizer treatments 3 times a day.   Acute exacerbation of COPD-with increased cough, dyspnea with exertion, shortness of breath at rest.  No increased oxygen requirement, but dyspnea and increased wheezing.  No evidence of acute bacterial infection. -Inpatient admission -Continue supplemental oxygen,  with goal O2 saturation greater than 90% -Scheduled DuoNebs, as needed albuterol inhaler -Given IV Solu-Medrol 125 mg by EMS, continue with IV Solu-Medrol 40 mg IV every 12 hours -Incentive spirometry -Flutter valve   Hypertension-blood pressure is stable, no losartan was discontinued during her last hospital admission   Tobacco abuse-continues to try to quit, declines nicotine patch at this time.   Paroxysmal atrial fibrillation-continue metoprolol and Eliquis   Anxiety-Xanax 0.5 mg twice daily as needed   Hypothyroidism-Synthroid   Hyperlipidemia-Crestor   GERD-Protonix  DVT prophylaxis: Eliquis    Code Status: Do not attempt resuscitation (DNR) PRE-ARREST INTERVENTIONS DESIRED  Consults called: None  Admission status: The appropriate patient status for this patient is INPATIENT. Inpatient status is judged to be reasonable and necessary in order to provide the required intensity of service to ensure the patient's safety. The patient's presenting symptoms, physical exam findings, and initial radiographic and laboratory data in the context of their chronic comorbidities is felt to place them at high risk for further clinical deterioration. Furthermore, it is not anticipated that the patient will be medically stable for discharge from the hospital within 2 midnights of admission.    I certify that at the point of admission it is my clinical judgment that the patient will require inpatient hospital care spanning beyond 2 midnights from the point of admission due to high intensity of service, high risk for further deterioration and high frequency of surveillance required   Time spent: 46 minutes  Alfreddie Consalvo Sharlette Dense MD [facility] Pager [phone]  If 7PM-7AM, please contact night-coverage [email or web address] Password [email or web address]  06/04/2023, 3:10 PM

## 2023-06-05 ENCOUNTER — Inpatient Hospital Stay (HOSPITAL_COMMUNITY): Payer: 59

## 2023-06-05 DIAGNOSIS — J4489 Other specified chronic obstructive pulmonary disease: Secondary | ICD-10-CM | POA: Diagnosis not present

## 2023-06-05 DIAGNOSIS — Z5941 Food insecurity: Secondary | ICD-10-CM | POA: Diagnosis present

## 2023-06-05 LAB — BASIC METABOLIC PANEL
Anion gap: 9 (ref 5–15)
BUN: 17 mg/dL (ref 8–23)
CO2: 30 mmol/L (ref 22–32)
Calcium: 9.2 mg/dL (ref 8.9–10.3)
Chloride: 100 mmol/L (ref 98–111)
Creatinine, Ser: 0.42 mg/dL — ABNORMAL LOW (ref 0.44–1.00)
GFR, Estimated: >60 mL/min (ref >60)
Glucose, Bld: 169 mg/dL — ABNORMAL HIGH (ref 70–99)
Potassium: 4.7 mmol/L (ref 3.5–5.1)
Sodium: 139 mmol/L (ref 135–145)

## 2023-06-05 LAB — CBC
HCT: 38.5 % (ref 36.0–46.0)
Hemoglobin: 11.9 g/dL — ABNORMAL LOW (ref 12.0–15.0)
MCH: 28.3 pg (ref 26.0–34.0)
MCHC: 30.9 g/dL (ref 30.0–36.0)
MCV: 91.7 fL (ref 80.0–100.0)
Platelets: 280 10*3/uL (ref 150–400)
RBC: 4.2 MIL/uL (ref 3.87–5.11)
RDW: 14.7 % (ref 11.5–15.5)
WBC: 8.7 10*3/uL (ref 4.0–10.5)
nRBC: 0 % (ref 0.0–0.2)

## 2023-06-05 MED ORDER — PANTOPRAZOLE SODIUM 40 MG PO TBEC
40.0000 mg | DELAYED_RELEASE_TABLET | Freq: Every day | ORAL | Status: DC
  Administered 2023-06-06: 40 mg via ORAL
  Filled 2023-06-05: qty 1

## 2023-06-05 MED ORDER — SERTRALINE HCL 25 MG PO TABS
25.0000 mg | ORAL_TABLET | Freq: Every day | ORAL | Status: DC
  Administered 2023-06-05 – 2023-06-06 (×2): 25 mg via ORAL
  Filled 2023-06-05 (×2): qty 1

## 2023-06-05 MED ORDER — PREDNISONE 20 MG PO TABS
20.0000 mg | ORAL_TABLET | Freq: Every day | ORAL | Status: DC
  Administered 2023-06-05 – 2023-06-06 (×2): 20 mg via ORAL
  Filled 2023-06-05 (×2): qty 1

## 2023-06-05 MED ORDER — MOMETASONE FURO-FORMOTEROL FUM 100-5 MCG/ACT IN AERO
2.0000 | INHALATION_SPRAY | Freq: Two times a day (BID) | RESPIRATORY_TRACT | Status: DC
  Administered 2023-06-05 – 2023-06-06 (×2): 2 via RESPIRATORY_TRACT
  Filled 2023-06-05: qty 8.8

## 2023-06-05 MED ORDER — POLYVINYL ALCOHOL 1.4 % OP SOLN: 1.0000 [drp] | Freq: Three times a day (TID) | OPHTHALMIC | Status: DC | PRN

## 2023-06-05 MED ORDER — MAGNESIUM OXIDE -MG SUPPLEMENT 400 (240 MG) MG PO TABS
400.0000 mg | ORAL_TABLET | Freq: Every day | ORAL | Status: DC
  Administered 2023-06-05 – 2023-06-06 (×2): 400 mg via ORAL
  Filled 2023-06-05 (×2): qty 1

## 2023-06-05 MED ORDER — UMECLIDINIUM BROMIDE 62.5 MCG/ACT IN AEPB
1.0000 | INHALATION_SPRAY | Freq: Every day | RESPIRATORY_TRACT | Status: DC
  Administered 2023-06-06: 1 via RESPIRATORY_TRACT
  Filled 2023-06-05: qty 7

## 2023-06-05 MED ORDER — OMEPRAZOLE MAGNESIUM 20 MG PO TBEC: 20.0000 mg | DELAYED_RELEASE_TABLET | Freq: Every day | ORAL | Status: DC

## 2023-06-05 MED ORDER — IPRATROPIUM-ALBUTEROL 0.5-2.5 (3) MG/3ML IN SOLN
3.0000 mL | Freq: Three times a day (TID) | RESPIRATORY_TRACT | Status: DC
  Administered 2023-06-05: 3 mL via RESPIRATORY_TRACT
  Filled 2023-06-05: qty 3

## 2023-06-05 MED ORDER — BUDESON-GLYCOPYRROL-FORMOTEROL 160-9-4.8 MCG/ACT IN AERO: 1.0000 | INHALATION_SPRAY | Freq: Two times a day (BID) | RESPIRATORY_TRACT | Status: DC

## 2023-06-05 NOTE — Hospital Course (Addendum)
67yo with h/o bipolar d/o, COPD on 2L home O2, and PAF on Eliquis who presented on 9/25 with cough and SOB c/w COPD exacerbation.  She has had 9 admissions and 4 ER visits in the last 6 months, primarily associated with COPD exacerbations, most recently 5 weeks ago.  In further discussion, she does not appear to have active exacerbation but rather chronically uncontrolled COPD with ongoing tobacco use and severe psychosocial stressors.

## 2023-06-05 NOTE — TOC Initial Note (Signed)
Transition of Care [facility]) - Initial/Assessment Note    Patient Details  Name: Tracy Park MRN: [id number] Date of Birth: 12-20-1955  Transition of Care [facility]) CM/SW Contact:    Otelia Santee, LCSW Phone Number: 06/05/2023, 3:29 PM  Clinical Narrative:                 Met with pt to discuss psychosocial concerns. Pt shares she is currently sleeping on the sofa of her daughter's friends house. She reports paying $650 in rent to stay with this friend. Pt reports that her oldest daughter is supposed to be talking with this friend to discuss housing situation and to come up with a compromise. Pt reports having a PCP through [facility] in [place]. She states she struggles to obtain her medications due to not having transportation. CSW discussed having medications mailed to her home by the pharmacy.  Pt shares that September is a difficult month for her due to many losses she has had including her father, husband, and 2 ex-husbands. Pt declined spiritual care consult and shares she will follow up with her preacher.    Expected Discharge Plan: Home/Self Care Barriers to Discharge: No Barriers Identified   Patient Goals and CMS Choice Patient states their goals for this hospitalization and ongoing recovery are:: To return home CMS Medicare.gov Compare Post Acute Care list provided to:: Patient Choice offered to / list presented to : Patient [place] ownership interest in [facility].provided to:: Patient    Expected Discharge Plan and Services In-house Referral: NA Discharge Planning Services: NA Post Acute Care Choice: NA Living arrangements for the past 2 months: Apartment                 DME Arranged: N/A DME Agency: NA                  Prior Living Arrangements/Services Living arrangements for the past 2 months: Apartment Lives with:: Friends Patient language and need for interpreter reviewed:: Yes Do you feel safe going back to the place where you live?:  Yes      Need for Family Participation in Patient Care: No (Comment) Care giver support system in place?: No (comment) Current home services: DME (Cane, Home O2 through [employer]) Criminal Activity/Legal Involvement Pertinent to Current Situation/Hospitalization: No - Comment as needed  Activities of Daily Living   ADL Screening (condition at time of admission) Does the patient have a NEW difficulty with bathing/dressing/toileting/self-feeding that is expected to last >3 days?: No Does the patient have a NEW difficulty with getting in/out of bed, walking, or climbing stairs that is expected to last >3 days?: No Does the patient have a NEW difficulty with communication that is expected to last >3 days?: No Is the patient deaf or have difficulty hearing?: No Does the patient have difficulty seeing, even when wearing glasses/contacts?: No Does the patient have difficulty concentrating, remembering, or making decisions?: No  Permission Sought/Granted   Permission granted to share information with : No              Emotional Assessment Appearance:: Appears stated age Attitude/Demeanor/Rapport: Engaged Affect (typically observed): Accepting Orientation: : Oriented to Self, Oriented to Place, Oriented to  Time, Oriented to Situation Alcohol / Substance Use: Tobacco Use Psych Involvement: No (comment)  Admission diagnosis:  COPD exacerbation (HCC) [J44.1] COPD with acute exacerbation [facility]) [J44.1] Patient Active Problem List   Diagnosis Date Noted   Left foot pain 04/12/2023   Acute exacerbation of  chronic obstructive pulmonary disease (COPD) (HCC) 03/06/2023   Hypocalcemia 03/06/2023   GAD (generalized anxiety disorder) 03/06/2023   Anemia of chronic disease 03/06/2023   Paroxysmal atrial fibrillation (HCC) 02/23/2023   Paroxysmal atrial fibrillation with RVR (HCC) 01/25/2023   Pulmonary nodule 01/25/2023   COPD (chronic obstructive pulmonary disease) (HCC) 01/25/2023   Essential  hypertension 01/25/2023   Thrombocytosis 03/26/2022   COPD exacerbation (HCC) 03/25/2022   DDD (degenerative disc disease), cervical 03/24/2022   Bipolar 2 disorder (HCC) 03/24/2022   Septic shock (HCC) 03/14/2022   Sepsis due to pneumonia (HCC) 03/13/2022   Dyslipidemia 03/13/2022   Anxiety and depression 03/13/2022   History of lung cancer 03/13/2022   Tobacco abuse 03/13/2022   Pneumonia 02/27/2022   Adenocarcinoma of lung (HCC) 02/26/2022   Leukocytosis 02/26/2022   COPD with acute exacerbation (HCC) 02/26/2022   Chronic hypoxic respiratory failure (HCC) 02/26/2022   Normocytic anemia 02/08/2022   Protein-calorie malnutrition, severe 08/25/2021   Influenza A with pneumonia 08/16/2021   Sepsis (HCC) 08/16/2021   Hyponatremia 08/16/2021   Hypokalemia 08/16/2021   Hypomagnesemia 08/16/2021   Hydronephrosis 05/03/2021   Acute encephalopathy 05/02/2021   Acute on chronic respiratory failure with hypoxia (HCC) 04/10/2020   CAP (community acquired pneumonia) 04/10/2020   Coronary artery disease involving native coronary artery of native heart without angina pectoris 08/18/2018   Malignant neoplasm of upper lobe of left lung (HCC) 11/12/2016   Chronic bilateral low back pain without sciatica 08/21/2016   Obstructive sleep apnea (adult) (pediatric) 02/22/2015   Hyperlipidemia 06/03/2013   Vitamin D deficiency 06/03/2013   GERD (gastroesophageal reflux disease) 04/28/2012   Acquired hypothyroidism 04/28/2012   Nicotine abuse 04/28/2012   Depression 04/28/2012   Chest pain, atypical 04/28/2012   PCP:  Jacquelin Hawking, PA-C Pharmacy:   [id number] - [place], [place] - [address] AT [facility] OF [address] [place] [address] Phone: [phone] Fax: [phone]     Social Determinants of Health (SDOH) Social History: SDOH Screenings   Food Insecurity: Food Insecurity Present (06/04/2023)  Housing: Low Risk   (06/04/2023)  Recent Concern: Housing - Medium Risk (04/12/2023)  Transportation Needs: No Transportation Needs (06/04/2023)  Recent Concern: Transportation Needs - Unmet Transportation Needs (04/12/2023)  Utilities: Not At Risk (06/04/2023)  Financial Resource Strain: Medium Risk (04/07/2023)   Received from [facility]  Physical Activity: Unknown (04/07/2023)   Received from [facility]  Social Connections: Moderately Integrated (04/07/2023)   Received from [facility]  Stress: Stress Concern Present (04/07/2023)   Received from [facility]  Tobacco Use: High Risk (06/04/2023)   SDOH Interventions:     Readmission Risk Interventions    04/29/2023    2:02 PM 04/17/2023    4:34 PM 04/13/2023    6:12 PM  Readmission Risk Prevention Plan  Transportation Screening Complete Complete Complete  Medication Review [employer]) Complete Complete Complete  PCP or Specialist appointment within 3-5 days of discharge Complete Complete Complete  HRI or Home Care Consult Complete Complete Complete  SW Recovery Care/Counseling Consult Complete Complete Complete  Palliative Care Screening Not Applicable Not Applicable Not Applicable  Skilled Nursing Facility Not Applicable Not Applicable Not Applicable

## 2023-06-05 NOTE — Progress Notes (Signed)
Mobility Specialist - Progress Note   06/05/23 1440  Mobility  Activity Ambulated independently in hallway  Level of Assistance Independent  Assistive Device None  Distance Ambulated (ft) 480 ft  Activity Response Tolerated well  Mobility Referral Yes  $Mobility charge 1 Mobility  Mobility Specialist Start Time (ACUTE ONLY) 0222  Mobility Specialist Stop Time (ACUTE ONLY) 0240  Mobility Specialist Time Calculation (min) (ACUTE ONLY) 18 min   Pt received in bed and agreeable to mobility. No complaints during session. Pt to bed after session with all needs met.     [facility]

## 2023-06-05 NOTE — Plan of Care (Signed)

## 2023-06-05 NOTE — Plan of Care (Signed)

## 2023-06-05 NOTE — Progress Notes (Signed)
Progress Note   Patient: Tracy Park VHQ:[id number] DOB: May 11, 1956 DOA: 06/04/2023     1 DOS: the patient was seen and examined on 06/05/2023   Brief hospital course: 67yo with h/o bipolar d/o, COPD on 2L home O2, and PAF on Eliquis who presented on 9/25 with cough and SOB c/w COPD exacerbation.  She has had 9 admissions and 4 ER visits in the last 6 months, primarily associated with COPD exacerbations, most recently 5 weeks ago.  In further discussion, she does not appear to have active exacerbation but rather chronically uncontrolled COPD with ongoing tobacco use and severe psychosocial stressors.  Assessment and Plan:  Chronic respiratory failure associated with uncontrolled COPD She has history of O2-dependent COPD and is currently on her home O2 She has a h/o recurrent hospitalizations She continues to smoke She is undergoing a large psychosocial burden Last saw pulm in 02/2023 There is no frank exacerbation at this time but she is clearly struggling with her chronic health condition Will admit patient to address a multifaceted approach and attempt to keep her without need for hospitalization longer Continue Breztri Nebulizers: scheduled Duoneb and prn albuterol Solu-Medrol 40 mg IV BID -> Prednisone 40 mg PO daily Coordinated care with [facility] team/PT/OT/Nutrition consults Incentive spirometry Flutter valve  Lung Cancer Stage 3a LOL adenocarcinoma s/p lobectomy and subsequent chemo 2018-19 CT in 02/2023 with new spiculated nodule  Will repeat CT   Hypertension Continue metoprolol Losartan was discontinued during her last hospital admission   Tobacco abuse Continues to try to quit Will order patch and cessation counseling - she desperately needs to quit   Paroxysmal atrial fibrillation Continue metoprolol for rate control Continue Eliquis - note that she was out of this medication because she did not have transportation to the pharmacy and likely needs to transition to  prescriptions by mail   Anxiety/Depression Continue sertraline and alprazolam  Consider addition of bupropion   Hypothyroidism Continue Synthroid   Hyperlipidemia Continue Crestor   GERD Continue Prilosec  Food insecurity She reports inability to afford food She is eating well while hospitalized    Consultants: TOC team  Procedures: None  Antibiotics: None  30 Day Unplanned Readmission Risk Score    Flowsheet Row ED to Hosp-Admission (Current) from [facility]04/2023 [facility]  30 Day Unplanned Readmission Risk Score (%) 57.48 Filed at 06/05/2023 0801       This score is the patient's risk of an unplanned readmission within 30 days of being discharged (0 -100%). The score is based on dignosis, age, lab data, medications, orders, and past utilization.   Low:  0-14.9   Medium: 15-21.9   High: 22-29.9   Extreme: 30 and above           Subjective: She reports ongoing smoking.  She moved to [place] and recently moved back.  She is paying $650 a month to sleep on her daughter's friend's couch.  She reports that she has no money to afford food and her daughter is harassing her for money that she doesn't have.  She is so very stressed and sad.  She denies having a PCP.  She previously had a pulmonologist but does not have one now.  She does not have transportation to pick up her medications.   Objective: Vitals:   06/05/23 1346 06/05/23 1408  BP:  103/66  Pulse:  87  Resp:    Temp:  97.9 F (36.6 C)  SpO2: 98% 97%  Intake/Output Summary (Last 24 hours) at 06/05/2023 1538 Last data filed at 06/05/2023 5409 Gross per 24 hour  Intake 360 ml  Output --  Net 360 ml   There were no vitals filed for this visit.  Exam:  General:  Appears calm and comfortable and is in NAD, emotionally labile; frail, cachectic Eyes:  EOMI, normal lids, iris ENT:  grossly normal hearing, lips & tongue, mmm Neck:  no LAD, masses or  thyromegaly Cardiovascular:  RRR, no m/r/g. No LE edema.  Respiratory:   Scattered wheezes, reasonable air movement.  Normal respiratory effort, no exertional dyspnea. Abdomen:  soft, NT, ND Skin:  no rash or induration seen on limited exam Musculoskeletal:  grossly normal tone BUE/BLE, good ROM, no bony abnormality Psychiatric:  blunted/labile mood and affect, speech fluent and appropriate, AOx3 Neurologic:  CN 2-12 grossly intact, moves all extremities in coordinated fashion, sensation intact  Data Reviewed: I have reviewed the patient's lab results since admission.  Pertinent labs for today include:   Glucose 169 Hgb 11.9     Family Communication: None present  Disposition: Status is: Inpatient Remains inpatient appropriate because: unsafe disposition     Time spent: 50 minutes  Unresulted Labs (From admission, onward)     Start     Ordered   06/06/23 0500  CBC with Differential/Platelet  Tomorrow morning,   R        06/05/23 1533   06/06/23 0500  Basic metabolic panel  Tomorrow morning,   R        06/05/23 1533             Author: Jonah Blue, MD 06/05/2023 3:38 PM  For on call review [email or web address].

## 2023-06-06 ENCOUNTER — Other Ambulatory Visit (HOSPITAL_COMMUNITY): Payer: Self-pay

## 2023-06-06 DIAGNOSIS — J4489 Other specified chronic obstructive pulmonary disease: Secondary | ICD-10-CM | POA: Diagnosis not present

## 2023-06-06 LAB — CBC WITH DIFFERENTIAL/PLATELET
Abs Immature Granulocytes: 0.06 10*3/uL (ref 0.00–0.07)
Basophils Absolute: 0 10*3/uL (ref 0.0–0.1)
Basophils Relative: 0 %
Eosinophils Absolute: 0 10*3/uL (ref 0.0–0.5)
Eosinophils Relative: 0 %
HCT: 37.5 % (ref 36.0–46.0)
Hemoglobin: 11.7 g/dL — ABNORMAL LOW (ref 12.0–15.0)
Immature Granulocytes: 1 %
Lymphocytes Relative: 18 %
Lymphs Abs: 2.4 10*3/uL (ref 0.7–4.0)
MCH: 29.3 pg (ref 26.0–34.0)
MCHC: 31.2 g/dL (ref 30.0–36.0)
MCV: 93.8 fL (ref 80.0–100.0)
Monocytes Absolute: 1 10*3/uL (ref 0.1–1.0)
Monocytes Relative: 8 %
Neutro Abs: 9.7 10*3/uL — ABNORMAL HIGH (ref 1.7–7.7)
Neutrophils Relative %: 73 %
Platelets: 245 10*3/uL (ref 150–400)
RBC: 4 MIL/uL (ref 3.87–5.11)
RDW: 14.9 % (ref 11.5–15.5)
WBC: 13.2 10*3/uL — ABNORMAL HIGH (ref 4.0–10.5)
nRBC: 0 % (ref 0.0–0.2)

## 2023-06-06 LAB — BASIC METABOLIC PANEL
Anion gap: 7 (ref 5–15)
BUN: 23 mg/dL (ref 8–23)
CO2: 29 mmol/L (ref 22–32)
Calcium: 8.7 mg/dL — ABNORMAL LOW (ref 8.9–10.3)
Chloride: 102 mmol/L (ref 98–111)
Creatinine, Ser: 0.51 mg/dL (ref 0.44–1.00)
GFR, Estimated: >60 mL/min (ref >60)
Glucose, Bld: 104 mg/dL — ABNORMAL HIGH (ref 70–99)
Potassium: 3.6 mmol/L (ref 3.5–5.1)
Sodium: 138 mmol/L (ref 135–145)

## 2023-06-06 MED ORDER — APIXABAN 5 MG PO TABS
5.0000 mg | ORAL_TABLET | Freq: Two times a day (BID) | ORAL | 1 refills | Status: AC
  Filled 2023-06-06: qty 60, 30d supply, fill #0

## 2023-06-06 MED ORDER — PREDNISONE 20 MG PO TABS
20.0000 mg | ORAL_TABLET | Freq: Every day | ORAL | 0 refills | Status: AC
  Filled 2023-06-06: qty 5, 5d supply, fill #0

## 2023-06-06 NOTE — Evaluation (Addendum)
Physical Therapy Evaluation Patient Details Name: Tracy Park MRN: [id number] DOB: 1956/02/02 Today's Date: 06/06/2023  History of Present Illness  67yo with h/o bipolar d/o, COPD on 2L home O2, and PAF on Eliquis who presented on 9/25 with cough and SOB c/w COPD exacerbation.  She has had 9 admissions and 4 ER visits in the last 6 months, primarily associated with COPD exacerbations,  Clinical Impression  Pt is mobilizing at baseline. She ambulated 375' without an assistive device, SpO2 91% on 2L O2 walking, 2/4 dyspnea, productive cough at end of session, RN notified of pt request for a breathing tx. Reviewed pursed lip breathing and benefit of resting as needed when pt is SOB. She is ready to DC home from a PT standpoint. Pt signing off.        If plan is discharge home, recommend the following: Assist for transportation;Assistance with cooking/housework   Can travel by private vehicle        Equipment Recommendations None recommended by PT  Recommendations for Other Services       Functional Status Assessment Patient has not had a recent decline in their functional status     Precautions / Restrictions Precautions Precautions: Fall Precaution Comments: denies falls over past 6 months but stated she's had "close calls" losing her balance; monitor O2 Restrictions Weight Bearing Restrictions: No      Mobility  Bed Mobility Overal bed mobility: Modified Independent             General bed mobility comments: HOB up    Transfers Overall transfer level: Independent Equipment used: None                    Ambulation/Gait Ambulation/Gait assistance: Independent Gait Distance (Feet): 375 Feet Assistive device: None Gait Pattern/deviations: WFL(Within Functional Limits) Gait velocity: WFL     General Gait Details: SpO2 91% on 2L O2, VCs for pursed lip breathing, 2/4 dyspnea, productive cough at end of session, RN notifed of pt request for a breathing  tx, pt had one minor episode of unsteadiness for which she steadied by reaching for the wall  Stairs            Wheelchair Mobility     Tilt Bed    Modified Rankin (Stroke Patients Only)       Balance Overall balance assessment: Mild deficits observed, not formally tested                                           Pertinent Vitals/Pain Pain Assessment Pain Assessment: No/denies pain    Home Living Family/patient expects to be discharged to:: Private residence Living Arrangements: Alone Available Help at Discharge: Family;Available PRN/intermittently Type of Home: House Home Access: Stairs to enter Entrance Stairs-Rails: Right Entrance Stairs-Number of Steps: 3   Home Layout: One level Home Equipment: Cane - single point Additional Comments: On 2 L O2 at home 24/7    Prior Function Prior Level of Function : Independent/Modified Independent             Mobility Comments: does not normally amb with device ADLs Comments: Ind with ADLs/selfcare, IADLs, home mgt, cooking, no longer drives bc she does not have a car     Extremity/Trunk Assessment   Upper Extremity Assessment Upper Extremity Assessment: Overall WFL for tasks assessed    Lower Extremity Assessment Lower Extremity Assessment: Overall  WFL for tasks assessed    Cervical / Trunk Assessment Cervical / Trunk Assessment: Normal  Communication   Communication Communication: No apparent difficulties  Cognition Arousal: Alert Behavior During Therapy: WFL for tasks assessed/performed Overall Cognitive Status: Within Functional Limits for tasks assessed                                          General Comments General comments (skin integrity, edema, etc.): Pt very resistant to any help at all.  Pt lives alone with many admissions in last few months.  Attempted some education about COPD and ways to manage things at home to prevent readmit, but pt does not want  theapy.    Exercises     Assessment/Plan    PT Assessment Patient does not need any further PT services  PT Problem List         PT Treatment Interventions      PT Goals (Current goals can be found in the Care Plan section)  Acute Rehab PT Goals PT Goal Formulation: All assessment and education complete, DC therapy    Frequency       Co-evaluation               AM-PAC PT "6 Clicks" Mobility  Outcome Measure                  End of Session Equipment Utilized During Treatment: Oxygen Activity Tolerance: Patient tolerated treatment well Patient left: in bed;with call bell/phone within reach Nurse Communication: Mobility status;Other (comment) (pt requests breathing tx)      Time: 1030-1048 PT Time Calculation (min) (ACUTE ONLY): 18 min   Charges:   PT Evaluation $PT Eval Low Complexity: 1 Low   PT General Charges $$ ACUTE PT VISIT: 1 Visit         Tamala Ser PT 06/06/2023  Acute Rehabilitation Services  Office [phone]

## 2023-06-06 NOTE — Progress Notes (Signed)
Initial Nutrition Assessment  DOCUMENTATION CODES:   Severe malnutrition in context of chronic illness  INTERVENTION:   -Ensure Plus High Protein po BID, each supplement provides 350 kcal and 20 grams of protein.  Provided chocolate at bedside.  -Recommend daily MVI and Vitamin C given continued smoking  NUTRITION DIAGNOSIS:   Severe Malnutrition related to chronic illness (COPD) as evidenced by severe fat depletion, severe muscle depletion, energy intake < 75% for > 7 days.  GOAL:   Patient will meet greater than or equal to 90% of their needs  MONITOR:   PO intake, Supplement acceptance, Skin, I & O's, Labs, Weight trends  REASON FOR ASSESSMENT:   Consult Assessment of nutrition requirement/status  ASSESSMENT:   67yo with h/o bipolar d/o, COPD on 2L home O2, and PAF on Eliquis who presented on 9/25 with cough and SOB c/w COPD exacerbation.  She has had 9 admissions and 4 ER visits in the last 6 months, primarily associated with COPD exacerbations, most recently 5 weeks ago.  In further discussion, she does not appear to have active exacerbation but rather chronically uncontrolled COPD with ongoing tobacco use and severe psychosocial stressors.  Patient in room, receiving breathing treatment. Pt states she is eating well here in the hospital. PO intakes: 100%.  Reports having a new living situation where she stays on someone's couch. Has no transportation to get meds or food for herself. Daughter informed her today that there is food available for her from a donation. Pt tries to drink Equate protein shakes at home. Provided Ensure at bedside for patient to sip on. Pt reports she was told she was discharging today.  No weight for this admission to be able to assess weight status at this time.  Medications: MAG-OX   Labs reviewed.  NUTRITION - FOCUSED PHYSICAL EXAM:  Flowsheet Row Most Recent Value  Orbital Region Moderate depletion  Upper Arm Region Severe depletion   Thoracic and Lumbar Region Severe depletion  Buccal Region Moderate depletion  Temple Region Severe depletion  Clavicle Bone Region Severe depletion  Clavicle and Acromion Bone Region Severe depletion  Scapular Bone Region Severe depletion  Dorsal Hand Severe depletion  Patellar Region Severe depletion  Anterior Thigh Region Severe depletion  Posterior Calf Region Severe depletion  Edema (RD Assessment) None  Hair Reviewed  Eyes Reviewed  Mouth Reviewed  Skin Reviewed       Diet Order:   Diet Order             Diet general           Diet regular Room service appropriate? Yes; Fluid consistency: Thin  Diet effective now                   EDUCATION NEEDS:   No education needs have been identified at this time  Skin:  Skin Assessment: Reviewed RN Assessment  Last BM:  9/26  Height:   Ht Readings from Last 1 Encounters:  04/29/23 5\' 3"  (1.6 m)    Weight:   Wt Readings from Last 1 Encounters:  04/17/23 47.6 kg    BMI:  There is no height or weight on file to calculate BMI.  Estimated Nutritional Needs:   Kcal:  1500-1700  Protein:  75-90g  Fluid:  1.7L/day   Tilda Franco, MS, RD, LDN Inpatient Clinical Dietitian Contact information available via Am[employer]

## 2023-06-06 NOTE — Discharge Summary (Addendum)
Physician Discharge Summary   Patient: Tracy Park MRN: [id number] DOB: 1955-12-27  Admit date:     06/04/2023  Discharge date: 06/06/23  Discharge Physician: Jonah Blue   PCP: Jacquelin Hawking, PA-C   Recommendations at discharge:   Complete prednisone for 5 more days Call [facility] to transfer medications and request home delivery/mailed medications Follow up with PA Somers in 1-2 weeks Follow up with [facility] STOP Smoking!!!!  Patch ordered.  Discharge Diagnoses: Principal Problem:   COPD (chronic obstructive pulmonary disease) with chronic bronchitis Active Problems:   Acquired hypothyroidism   Tobacco abuse   Anxiety and depression   Hyperlipidemia   Essential hypertension   Paroxysmal atrial fibrillation (HCC)   Malignant neoplasm of upper lobe of left lung [facility])   Food insecurity   Hospital Course: 67yo with h/o bipolar d/o, COPD on 2L home O2, and PAF on Eliquis who presented on 9/25 with cough and SOB c/w COPD exacerbation.  She has had 9 admissions and 4 ER visits in the last 6 months, primarily associated with COPD exacerbations, most recently 5 weeks ago.  In further discussion, she does not appear to have active exacerbation but rather chronically uncontrolled COPD with ongoing tobacco use and severe psychosocial stressors.  Assessment and Plan:  Chronic respiratory failure associated with uncontrolled COPD She has history of O2-dependent COPD and is currently on her home O2 She has a h/o recurrent hospitalizations She continues to smoke She is undergoing a large psychosocial burden Last saw pulm in 02/2023 There is no frank exacerbation at this time but she is clearly struggling with her chronic health condition Patient admitted to address a multifaceted approach and attempt to reduce high likelihood of recurrent hospitalization  Continue Breztri Nebulizers: scheduled Duoneb and prn albuterol Solu-Medrol 40 mg IV BID ->  Prednisone 40 mg PO daily Coordinated care with [facility] team/PT/OT/Nutrition consults Incentive spirometry Flutter valve   Lung Cancer Stage 3a LOL adenocarcinoma s/p lobectomy and subsequent chemo 2018-19 CT in 02/2023 with new spiculated nodule  Repeated CT with interval resolution of LLL pulmonary nodule and no new nodules   Hypertension Continue metoprolol Losartan was discontinued during her last hospital admission   Tobacco abuse Continues to try to quit Will order patch and cessation counseling - she desperately needs to quit   Paroxysmal atrial fibrillation Continue metoprolol for rate control Continue Eliquis - note that she was out of this medication because she did not have transportation to the pharmacy and likely needs to transition to prescriptions by mail Will change medications to [facility] with goal for delivered medications to her home   Anxiety/Depression Continue sertraline and alprazolam  Consider addition of bupropion   Hypothyroidism Continue Synthroid   Hyperlipidemia Continue Crestor   GERD Continue Prilosec  ASCVD Multi-vessel CAD and aortic atherosclerosis seen on imaging No current symptoms Suggest outpatient f/u   Food insecurity/severe malnutrition She reports inability to afford food She is eating well while hospitalized Started on Ensure Plus High Protein      Consultants: TOC team PT/OT Nutrition   Procedures: None   Antibiotics: None    30 Day Unplanned Readmission Risk Score    Flowsheet Row ED to Hosp-Admission (Current) from 06/04/2023 in [facility]  30 Day Unplanned Readmission Risk Score (%) 61.9 Filed at 06/06/2023 0801       This score is the patient's risk of an unplanned readmission within 30 days of being discharged (0 -  100%). The score is based on dignosis, age, lab data, medications, orders, and past utilization.   Low:  0-14.9   Medium: 15-21.9   High: 22-29.9    Extreme: 30 and above           Pain control - [place] Controlled Substance Reporting System database was reviewed. and patient was instructed, not to drive, operate heavy machinery, perform activities at heights, swimming or participation in water activities or provide baby-sitting services while on Pain, Sleep and Anxiety Medications; until their outpatient Physician has advised to do so again. Also recommended to not to take more than prescribed Pain, Sleep and Anxiety Medications.    Disposition: Home Diet recommendation:  Regular diet DISCHARGE MEDICATION: Allergies as of 06/06/2023       Reactions   Red Dye Hives, Itching, Other (See Comments)   Red food dye   Red Dye #40 (allura Red) Hives, Itching, Other (See Comments)   Red food dye   Strawberry Extract Hives, Itching   Tomato Hives, Itching   Oxycontin [oxycodone Hcl] Other (See Comments)   Hallucinations    Aspirin Hives   Tape Rash, Other (See Comments)   Prefers paper tape   Wound Dressing Adhesive Rash        Medication List     TAKE these medications    albuterol 108 (90 Base) MCG/ACT inhaler Commonly known as: VENTOLIN HFA Inhale 2 puffs into the lungs every 6 (six) hours as needed for wheezing or shortness of breath. What changed: when to take this   albuterol (2.5 MG/3ML) 0.083% nebulizer solution Commonly known as: PROVENTIL Take 3 mLs (2.5 mg total) by nebulization every 6 (six) hours as needed for wheezing or shortness of breath. What changed: when to take this   ALPRAZolam 0.5 MG tablet Commonly known as: XANAX Take 0.5 mg by mouth 2 (two) times daily as needed for anxiety.   apixaban 5 MG Tabs tablet Commonly known as: Eliquis Take 1 tablet (5 mg total) by mouth 2 (two) times daily.   Breztri Aerosphere 160-9-4.8 MCG/ACT Aero Generic drug: Budeson-Glycopyrrol-Formoterol Inhale 1 puff into the lungs in the morning and at bedtime.   levothyroxine 125 MCG tablet Commonly known  as: SYNTHROID Take 125 mcg by mouth daily before breakfast.   magnesium oxide 400 (240 Mg) MG tablet Commonly known as: MAG-OX Take 400 mg by mouth in the morning.   metoprolol succinate 25 MG 24 hr tablet Commonly known as: TOPROL-XL Take 12.5 mg by mouth daily.   nitroGLYCERIN 0.4 MG SL tablet Commonly known as: NITROSTAT Place 0.4 mg under the tongue every 5 (five) minutes as needed for chest pain.   omeprazole 20 MG tablet Commonly known as: PRILOSEC OTC Take 20 mg by mouth daily before breakfast.   OXYGEN Inhale 2 L/min into the lungs continuous.   predniSONE 20 MG tablet Commonly known as: DELTASONE Take 1 tablet (20 mg total) by mouth daily with breakfast for 5 days. Start taking on: June 07, 2023   rosuvastatin 20 MG tablet Commonly known as: CRESTOR Take 20 mg by mouth daily.   sertraline 25 MG tablet Commonly known as: ZOLOFT Take 25 mg by mouth in the morning.   Systane Complete PF 0.6 % Soln Generic drug: Propylene Glycol (PF) Place 1 drop into both eyes 3 (three) times daily as needed (for dryness).   TYLENOL 500 MG tablet Generic drug: acetaminophen Take 1,000 mg by mouth every 6 (six) hours as needed for mild pain or  headache.        Discharge Exam: There were no vitals filed for this visit.   Subjective: Feeling ok today.  Still with more wheezing than usual but able to have complete conversations without dyspnea.   Objective: Vitals:   06/05/23 1921 06/06/23 0519  BP:  114/75  Pulse:  66  Resp:  15  Temp:  97.9 F (36.6 C)  SpO2: 97% 99%    Intake/Output Summary (Last 24 hours) at 06/06/2023 1116 Last data filed at 06/05/2023 1847 Gross per 24 hour  Intake 887 ml  Output --  Net 887 ml   There were no vitals filed for this visit.  Exam:  General:  Appears calm and comfortable and is in NAD; frail, cachectic Eyes:  EOMI, normal lids, iris ENT:  grossly normal hearing, lips & tongue, mmm Neck:  no LAD, masses or  thyromegaly Cardiovascular:  RRR, no m/r/g. No LE edema.  Respiratory:   Expiratory wheezes, reasonable air movement.  Normal respiratory effort, no exertional/conversational dyspnea, on home O2. Abdomen:  soft, NT, ND Skin:  no rash or induration seen on limited exam Musculoskeletal:  grossly normal tone BUE/BLE, good ROM, no bony abnormality Psychiatric:  blunted mood and affect, speech fluent and appropriate, AOx3 Neurologic:  CN 2-12 grossly intact, moves all extremities in coordinated fashion  Data Reviewed: I have reviewed the patient's lab results since admission.  Pertinent labs for today include:   Glucose 104 WBC 13.2 Hgb 11.7    Condition at discharge: stable  The results of significant diagnostics from this hospitalization (including imaging, microbiology, ancillary and laboratory) are listed below for reference.   Imaging Studies: CT CHEST WO CONTRAST  Result Date: 06/05/2023 CLINICAL DATA:  Lung nodule. EXAM: CT CHEST WITHOUT CONTRAST TECHNIQUE: Multidetector CT imaging of the chest was performed following the standard protocol without IV contrast. RADIATION DOSE REDUCTION: This exam was performed according to the departmental dose-optimization program which includes automated exposure control, adjustment of the mA and/or kV according to patient size and/or use of iterative reconstruction technique. COMPARISON:  03/08/2023. FINDINGS: Cardiovascular: The heart is normal in size and there is a small pericardial effusion. Multi-vessel coronary artery calcifications are noted. There is atherosclerotic calcification of the aorta without evidence of aneurysm. The pulmonary trunk is normal in caliber. Mediastinum/Nodes: There is reduced lung volume on the left with mediastinal shift to the left. No mediastinal or axillary lymphadenopathy by size criteria. Evaluation of the hila is limited due to lack of IV contrast. The trachea and esophagus are within normal limits. Lungs/Pleura:  Centrilobular and paraseptal emphysematous changes are present in the lungs. The patient is status post left upper lobectomy with stable parenchymal scarring in the right upper lobe bronchiectasis and bronchial wall thickening is noted on the left. No effusion or pneumothorax. Stable opacities are noted in the posterior aspect of the right upper lobe. The previously described 1.1 cm left lower lobe nodule is no longer seen. A stable 4 mm nodule is present in the right upper lobe, axial image 63 and is unchanged from 2021 and likely benign. No new nodule is seen. Upper Abdomen: No acute abnormality. Musculoskeletal: Cervical spinal fusion hardware is noted. No acute or suspicious osseous abnormality. IMPRESSION: 1. Interval resolution of left lower lobe pulmonary nodule. No new nodule is seen. 2. Stable postsurgical changes of left upper lobectomy. 3. Emphysema. 4. Multi-vessel coronary artery calcifications. 5. Aortic atherosclerosis. Electronically Signed   By: Thornell Sartorius M.D.   On: 06/05/2023 21:53  DG Chest 2 View  Result Date: 06/04/2023 CLINICAL DATA:  COPD. Shortness of breath. Chest tightness. Cough. EXAM: CHEST - 2 VIEW COMPARISON:  04/28/2023. FINDINGS: Redemonstration of postsurgical changes from left upper lobectomy characterized by left lung apex opacification with associated linear areas of fibrosis/scarring and upward pulling of the left hilum, essentially unchanged since the prior study. Bilateral lungs are otherwise grossly clear. No acute consolidation or major lung collapse. Bilateral costophrenic angles are clear. Stable cardio-mediastinal silhouette. No acute osseous abnormalities. Lower cervical spinal fixation hardware is again seen. The soft tissues are within normal limits. IMPRESSION: *No acute cardiopulmonary process. Chronic changes of left upper lobectomy. Electronically Signed   By: Jules Schick M.D.   On: 06/04/2023 15:29    Microbiology: Results for orders placed or  performed during the hospital encounter of 04/28/23  Expectorated Sputum Assessment w Gram Stain, Rflx to Resp Cult     Status: None   Collection Time: 04/29/23  7:37 PM  Result Value Ref Range Status   Specimen Description EXPECTORATED SPUTUM  Final   Special Requests NONE  Final   Sputum evaluation   Final    THIS SPECIMEN IS ACCEPTABLE FOR SPUTUM CULTURE Performed at [facility], [address] [place], [place] [address]    Report Status 04/29/2023 FINAL  Final  Culture, Respiratory w Gram Stain     Status: None   Collection Time: 04/29/23  7:37 PM  Result Value Ref Range Status   Specimen Description   Final    EXPECTORATED SPUTUM Performed at [facility], [address], [place], [place] [address]    Special Requests   Final    NONE Reflexed from [phone] Performed at [facility], [address] [place], [place] [address]    Gram Stain   Final    FEW WBC PRESENT,BOTH PMN AND MONONUCLEAR FEW GRAM POSITIVE COCCI IN PAIRS FEW GRAM NEGATIVE RODS FEW GRAM POSITIVE RODS Performed at [facility], [address] [place], [place] [address]    Culture FEW PSEUDOMONAS AERUGINOSA  Final   Report Status 05/02/2023 FINAL  Final   Organism ID, Bacteria PSEUDOMONAS AERUGINOSA  Final      Susceptibility   Pseudomonas aeruginosa - MIC*    CEFTAZIDIME 4 SENSITIVE Sensitive     CIPROFLOXACIN <=0.25 SENSITIVE Sensitive     GENTAMICIN 8 INTERMEDIATE Intermediate     IMIPENEM <=0.25 SENSITIVE Sensitive     PIP/TAZO 8 SENSITIVE Sensitive     * FEW PSEUDOMONAS AERUGINOSA    Labs: CBC: Recent Labs  Lab 06/04/23 1313 06/05/23 0512 06/06/23 0528  WBC 7.9 8.7 13.2*  NEUTROABS  --   --  9.7*  HGB 12.8 11.9* 11.7*  HCT 41.8 38.5 37.5  MCV 92.7 91.7 93.8  PLT 232 280 245   Basic Metabolic Panel: Recent Labs  Lab 06/04/23 1313 06/05/23 0512 06/06/23 0528  NA 140 139 138  K 3.7 4.7 3.6  CL 97* 100 102  CO2 32 30  29  GLUCOSE 138* 169* 104*  BUN 9 17 23   CREATININE 0.47 0.42* 0.51  CALCIUM 9.5 9.2 8.7*   Liver Function Tests: No results for input(s): "AST", "ALT", "ALKPHOS", "BILITOT", "PROT", "ALBUMIN" in the last 168 hours. CBG: No results for input(s): "GLUCAP" in the last 168 hours.  Discharge time spent: greater than 30 minutes.  Signed: Jonah Blue, MD [facility] 06/06/2023

## 2023-06-06 NOTE — Evaluation (Signed)
Occupational Therapy Evaluation and Discharge Summary Patient Details Name: Tracy SCOPEL MRN: [id number] DOB: 08/04/1956 Today's Date: 06/06/2023   History of Present Illness 67yo with h/o bipolar d/o, COPD on 2L home O2, and PAF on Eliquis who presented on 9/25 with cough and SOB c/w COPD exacerbation.  She has had 9 admissions and 4 ER visits in the last 6 months, primarily associated with COPD exacerbations,   Clinical Impression   Pt admitted with the above diagnosis and overall is at baseline.  Pt lives alone and completes all adls and iadls without assist outside of driving. Pt has had several readmissions in last 6 months. Attempted to educated pt on energy conservation but pt not interested.  Spoke with pt's MD about mailing meds to pt as she seems to run out of them which is followed by an ER visit. MD in process of changing meds to being mailed to pt.  Pt not interested in further OT and is mod I on 2L O2 with basic adls in room.         If plan is discharge home, recommend the following: Assist for transportation    Functional Status Assessment  Patient has not had a recent decline in their functional status  Equipment Recommendations  None recommended by OT    Recommendations for Other Services       Precautions / Restrictions Precautions Precautions: Other (comment) (multiple admits over last 6 months. Talked about how to avoid these w regular focus on meds etc at home.) Restrictions Weight Bearing Restrictions: No      Mobility Bed Mobility Overal bed mobility: Modified Independent             General bed mobility comments: no assist needed    Transfers Overall transfer level: Modified independent Equipment used: None               General transfer comment: no assist needed this am      Balance Overall balance assessment: Mild deficits observed, not formally tested                                         ADL either  performed or assessed with clinical judgement   ADL Overall ADL's : At baseline                                       General ADL Comments: Pt at baseline. Attempted to educated on energy conservation but pt is not interested.     Vision Baseline Vision/History: 0 No visual deficits Ability to See in Adequate Light: 0 Adequate Patient Visual Report: No change from baseline Vision Assessment?: No apparent visual deficits     Perception Perception: Within Functional Limits       Praxis Praxis: WFL       Pertinent Vitals/Pain Pain Assessment Pain Assessment: No/denies pain     Extremity/Trunk Assessment Upper Extremity Assessment Upper Extremity Assessment: Overall WFL for tasks assessed   Lower Extremity Assessment Lower Extremity Assessment: Defer to PT evaluation   Cervical / Trunk Assessment Cervical / Trunk Assessment: Normal   Communication Communication Communication: No apparent difficulties   Cognition Arousal: Alert Behavior During Therapy: WFL for tasks assessed/performed Overall Cognitive Status: Within Functional Limits for tasks assessed  General Comments: Pt very resistant to therapy or changing her ways. Pt not at all interested in learning about energy conservation/smoking cessation etc (prob why so many readmissions). Did speak with MD about seeing if meds could be mailed to her as she runs out of them frequently and states her daughter that lives local " is not on call to take care of these things." MD already in process of setting this up.     General Comments  Pt very resistant to any help at all.  Pt lives alone with many admissions in last few months.  Attempted some education about COPD and ways to manage things at home to prevent readmit, but pt does not want theapy.    Exercises     Shoulder Instructions      Home Living Family/patient expects to be discharged to:: Private  residence Living Arrangements: Alone Available Help at Discharge: Family;Available PRN/intermittently Type of Home: House Home Access: Stairs to enter [employer] of Steps: 3 Entrance Stairs-Rails: Right Home Layout: One level     Bathroom Shower/Tub: Chief Strategy Officer: Standard Bathroom Accessibility: Yes   Home Equipment: Cane - single point   Additional Comments: On 2 L O2 at home 24/7      Prior Functioning/Environment Prior Level of Function : Independent/Modified Independent             Mobility Comments: does not normally amb with device ADLs Comments: Ind with ADLs/selfcare, IADLs, home mgt, cooking, no longer drives bc she does not have a car        OT Problem List: Cardiopulmonary status limiting activity      OT Treatment/Interventions:      OT Goals(Current goals can be found in the care plan section) Acute Rehab OT Goals Patient Stated Goal: to get out of here OT Goal Formulation: All assessment and education complete, DC therapy  OT Frequency:      Co-evaluation              AM-PAC OT "6 Clicks" Daily Activity     Outcome Measure Help from another person eating meals?: None Help from another person taking care of personal grooming?: None Help from another person toileting, which includes using toliet, bedpan, or urinal?: None Help from another person bathing (including washing, rinsing, drying)?: None Help from another person to put on and taking off regular upper body clothing?: None Help from another person to put on and taking off regular lower body clothing?: None 6 Click Score: 24   End of Session Equipment Utilized During Treatment: Oxygen Nurse Communication: Mobility status  Activity Tolerance: Patient tolerated treatment well Patient left: in bed;with call bell/phone within reach  OT Visit Diagnosis: Unsteadiness on feet (R26.81)                Time: 4132-4401 OT Time Calculation (min): 12  min Charges:  OT General Charges $OT Visit: 1 Visit OT Evaluation $OT Eval Low Complexity: 1 Low  Hope Budds 06/06/2023, 9:38 AM

## 2023-06-06 NOTE — Progress Notes (Signed)
AVS reviewed w/ patient after Flu vaccine administration- Med & times given updated on current AVS in pen by this RN - Pt verbalized an understanding- PIV removed as documented- Pt to home via w/c on home O2 at 2 L [place] in cab.

## 2023-06-19 ENCOUNTER — Encounter (HOSPITAL_COMMUNITY): Payer: Self-pay

## 2023-06-19 ENCOUNTER — Emergency Department (HOSPITAL_COMMUNITY): Payer: 59

## 2023-06-19 ENCOUNTER — Inpatient Hospital Stay (HOSPITAL_COMMUNITY)
Admission: EM | Admit: 2023-06-19 | Discharge: 2023-06-22 | DRG: 190 | Disposition: A | Discharge status: Home Health | Payer: 59 | Attending: Family Medicine | Admitting: Family Medicine

## 2023-06-19 ENCOUNTER — Other Ambulatory Visit: Payer: Self-pay

## 2023-06-19 DIAGNOSIS — E785 Hyperlipidemia, unspecified: Secondary | ICD-10-CM | POA: Diagnosis present

## 2023-06-19 DIAGNOSIS — F419 Anxiety disorder, unspecified: Secondary | ICD-10-CM | POA: Diagnosis present

## 2023-06-19 DIAGNOSIS — I480 Paroxysmal atrial fibrillation: Secondary | ICD-10-CM | POA: Diagnosis not present

## 2023-06-19 DIAGNOSIS — J441 Chronic obstructive pulmonary disease with (acute) exacerbation: Principal | ICD-10-CM

## 2023-06-19 DIAGNOSIS — Z801 Family history of malignant neoplasm of trachea, bronchus and lung: Secondary | ICD-10-CM

## 2023-06-19 DIAGNOSIS — Z8249 Family history of ischemic heart disease and other diseases of the circulatory system: Secondary | ICD-10-CM

## 2023-06-19 DIAGNOSIS — R000 Tachycardia, unspecified: Secondary | ICD-10-CM | POA: Diagnosis present

## 2023-06-19 DIAGNOSIS — Z90710 Acquired absence of both cervix and uterus: Secondary | ICD-10-CM

## 2023-06-19 DIAGNOSIS — F32A Depression, unspecified: Secondary | ICD-10-CM | POA: Diagnosis present

## 2023-06-19 DIAGNOSIS — Z87442 Personal history of urinary calculi: Secondary | ICD-10-CM

## 2023-06-19 DIAGNOSIS — Z91048 Other nonmedicinal substance allergy status: Secondary | ICD-10-CM

## 2023-06-19 DIAGNOSIS — Z85118 Personal history of other malignant neoplasm of bronchus and lung: Secondary | ICD-10-CM

## 2023-06-19 DIAGNOSIS — Z9109 Other allergy status, other than to drugs and biological substances: Secondary | ICD-10-CM

## 2023-06-19 DIAGNOSIS — J9621 Acute and chronic respiratory failure with hypoxia: Secondary | ICD-10-CM | POA: Diagnosis not present

## 2023-06-19 DIAGNOSIS — Z79890 Hormone replacement therapy (postmenopausal): Secondary | ICD-10-CM

## 2023-06-19 DIAGNOSIS — E890 Postprocedural hypothyroidism: Secondary | ICD-10-CM | POA: Diagnosis present

## 2023-06-19 DIAGNOSIS — Z1152 Encounter for screening for COVID-19: Secondary | ICD-10-CM

## 2023-06-19 DIAGNOSIS — I252 Old myocardial infarction: Secondary | ICD-10-CM

## 2023-06-19 DIAGNOSIS — I10 Essential (primary) hypertension: Secondary | ICD-10-CM | POA: Diagnosis present

## 2023-06-19 DIAGNOSIS — Z902 Acquired absence of lung [part of]: Secondary | ICD-10-CM

## 2023-06-19 DIAGNOSIS — F319 Bipolar disorder, unspecified: Secondary | ICD-10-CM | POA: Diagnosis present

## 2023-06-19 DIAGNOSIS — Z79899 Other long term (current) drug therapy: Secondary | ICD-10-CM

## 2023-06-19 DIAGNOSIS — D72829 Elevated white blood cell count, unspecified: Secondary | ICD-10-CM | POA: Diagnosis present

## 2023-06-19 DIAGNOSIS — E876 Hypokalemia: Secondary | ICD-10-CM

## 2023-06-19 DIAGNOSIS — Z886 Allergy status to analgesic agent status: Secondary | ICD-10-CM

## 2023-06-19 DIAGNOSIS — Z7901 Long term (current) use of anticoagulants: Secondary | ICD-10-CM

## 2023-06-19 DIAGNOSIS — Z885 Allergy status to narcotic agent status: Secondary | ICD-10-CM

## 2023-06-19 DIAGNOSIS — K219 Gastro-esophageal reflux disease without esophagitis: Secondary | ICD-10-CM | POA: Diagnosis present

## 2023-06-19 DIAGNOSIS — F4310 Post-traumatic stress disorder, unspecified: Secondary | ICD-10-CM | POA: Diagnosis present

## 2023-06-19 DIAGNOSIS — Z9981 Dependence on supplemental oxygen: Secondary | ICD-10-CM

## 2023-06-19 DIAGNOSIS — F17210 Nicotine dependence, cigarettes, uncomplicated: Secondary | ICD-10-CM | POA: Diagnosis present

## 2023-06-19 DIAGNOSIS — Z833 Family history of diabetes mellitus: Secondary | ICD-10-CM

## 2023-06-19 DIAGNOSIS — Z66 Do not resuscitate: Secondary | ICD-10-CM | POA: Diagnosis present

## 2023-06-19 LAB — BASIC METABOLIC PANEL
Anion gap: 9 (ref 5–15)
BUN: 11 mg/dL (ref 8–23)
CO2: 32 mmol/L (ref 22–32)
Calcium: 8.7 mg/dL — ABNORMAL LOW (ref 8.9–10.3)
Chloride: 98 mmol/L (ref 98–111)
Creatinine, Ser: 0.56 mg/dL (ref 0.44–1.00)
GFR, Estimated: >60 mL/min (ref >60)
Glucose, Bld: 154 mg/dL — ABNORMAL HIGH (ref 70–99)
Potassium: 3.1 mmol/L — ABNORMAL LOW (ref 3.5–5.1)
Sodium: 139 mmol/L (ref 135–145)

## 2023-06-19 LAB — TROPONIN I (HIGH SENSITIVITY): Troponin I (High Sensitivity): 8 ng/L (ref <18)

## 2023-06-19 LAB — CBC
HCT: 39.4 % (ref 36.0–46.0)
Hemoglobin: 12.4 g/dL (ref 12.0–15.0)
MCH: 30.2 pg (ref 26.0–34.0)
MCHC: 31.5 g/dL (ref 30.0–36.0)
MCV: 96.1 fL (ref 80.0–100.0)
Platelets: 225 10*3/uL (ref 150–400)
RBC: 4.1 MIL/uL (ref 3.87–5.11)
RDW: 14.6 % (ref 11.5–15.5)
WBC: 18.4 10*3/uL — ABNORMAL HIGH (ref 4.0–10.5)
nRBC: 0 % (ref 0.0–0.2)

## 2023-06-19 LAB — RESP PANEL BY RT-PCR (RSV, FLU A&B, COVID)  RVPGX2
Influenza A by PCR: NEGATIVE
Influenza B by PCR: NEGATIVE
Resp Syncytial Virus by PCR: NEGATIVE
SARS Coronavirus 2 by RT PCR: NEGATIVE

## 2023-06-19 MED ORDER — MAGNESIUM HYDROXIDE 400 MG/5ML PO SUSP: 30.0000 mL | Freq: Every day | ORAL | Status: DC | PRN

## 2023-06-19 MED ORDER — MAGNESIUM OXIDE -MG SUPPLEMENT 400 (240 MG) MG PO TABS
400.0000 mg | ORAL_TABLET | Freq: Every day | ORAL | Status: DC
  Administered 2023-06-20 – 2023-06-22 (×3): 400 mg via ORAL
  Filled 2023-06-19 (×3): qty 1

## 2023-06-19 MED ORDER — SODIUM CHLORIDE 0.9 % IV SOLN
1.0000 g | INTRAVENOUS | Status: DC
  Administered 2023-06-19 – 2023-06-21 (×3): 1 g via INTRAVENOUS
  Filled 2023-06-19 (×4): qty 10

## 2023-06-19 MED ORDER — PANTOPRAZOLE SODIUM 40 MG PO TBEC
40.0000 mg | DELAYED_RELEASE_TABLET | Freq: Every day | ORAL | Status: DC
  Administered 2023-06-20 – 2023-06-22 (×3): 40 mg via ORAL
  Filled 2023-06-19 (×3): qty 1

## 2023-06-19 MED ORDER — ROSUVASTATIN CALCIUM 20 MG PO TABS
20.0000 mg | ORAL_TABLET | Freq: Every day | ORAL | Status: DC
  Administered 2023-06-20 – 2023-06-22 (×3): 20 mg via ORAL
  Filled 2023-06-19 (×3): qty 1

## 2023-06-19 MED ORDER — POTASSIUM CHLORIDE 20 MEQ PO PACK: 40.0000 meq | PACK | Freq: Once | ORAL | Status: DC

## 2023-06-19 MED ORDER — ONDANSETRON HCL 4 MG/2ML IJ SOLN: 4.0000 mg | Freq: Four times a day (QID) | INTRAMUSCULAR | Status: DC | PRN

## 2023-06-19 MED ORDER — APIXABAN 5 MG PO TABS
5.0000 mg | ORAL_TABLET | Freq: Two times a day (BID) | ORAL | Status: DC
  Administered 2023-06-19 – 2023-06-22 (×6): 5 mg via ORAL
  Filled 2023-06-19 (×6): qty 1

## 2023-06-19 MED ORDER — NITROGLYCERIN 0.4 MG SL SUBL: 0.4000 mg | SUBLINGUAL_TABLET | SUBLINGUAL | Status: DC | PRN

## 2023-06-19 MED ORDER — LEVOTHYROXINE SODIUM 25 MCG PO TABS
125.0000 ug | ORAL_TABLET | Freq: Every day | ORAL | Status: DC
  Administered 2023-06-20 – 2023-06-22 (×3): 125 ug via ORAL
  Filled 2023-06-19 (×3): qty 1

## 2023-06-19 MED ORDER — OMEPRAZOLE MAGNESIUM 20 MG PO TBEC: 20.0000 mg | DELAYED_RELEASE_TABLET | Freq: Every day | ORAL | Status: DC

## 2023-06-19 MED ORDER — POTASSIUM CHLORIDE 20 MEQ PO PACK
40.0000 meq | PACK | Freq: Once | ORAL | Status: AC
  Administered 2023-06-19: 40 meq via ORAL
  Filled 2023-06-19: qty 2

## 2023-06-19 MED ORDER — ACETAMINOPHEN 650 MG RE SUPP: 650.0000 mg | Freq: Four times a day (QID) | RECTAL | Status: DC | PRN

## 2023-06-19 MED ORDER — SERTRALINE HCL 25 MG PO TABS
25.0000 mg | ORAL_TABLET | Freq: Every day | ORAL | Status: DC
  Administered 2023-06-20 – 2023-06-22 (×3): 25 mg via ORAL
  Filled 2023-06-19 (×3): qty 1

## 2023-06-19 MED ORDER — TRAZODONE HCL 50 MG PO TABS
25.0000 mg | ORAL_TABLET | Freq: Every evening | ORAL | Status: DC | PRN
  Administered 2023-06-20 – 2023-06-21 (×2): 25 mg via ORAL
  Filled 2023-06-19 (×2): qty 1

## 2023-06-19 MED ORDER — ACETAMINOPHEN 325 MG PO TABS
650.0000 mg | ORAL_TABLET | Freq: Four times a day (QID) | ORAL | Status: DC | PRN
  Administered 2023-06-20: 650 mg via ORAL
  Filled 2023-06-19: qty 2

## 2023-06-19 MED ORDER — POLYVINYL ALCOHOL 1.4 % OP SOLN
1.0000 [drp] | Freq: Three times a day (TID) | OPHTHALMIC | Status: DC | PRN
  Administered 2023-06-21: 1 [drp] via OPHTHALMIC
  Filled 2023-06-19: qty 15

## 2023-06-19 MED ORDER — MAGNESIUM SULFATE 2 GM/50ML IV SOLN
2.0000 g | Freq: Once | INTRAVENOUS | Status: AC
  Administered 2023-06-19: 2 g via INTRAVENOUS
  Filled 2023-06-19: qty 50

## 2023-06-19 MED ORDER — ONDANSETRON HCL 4 MG PO TABS: 4.0000 mg | ORAL_TABLET | Freq: Four times a day (QID) | ORAL | Status: DC | PRN

## 2023-06-19 MED ORDER — ALPRAZOLAM 0.5 MG PO TABS
0.5000 mg | ORAL_TABLET | Freq: Two times a day (BID) | ORAL | Status: DC | PRN
  Administered 2023-06-22: 0.5 mg via ORAL
  Filled 2023-06-19: qty 1

## 2023-06-19 MED ORDER — METOPROLOL SUCCINATE ER 25 MG PO TB24
12.5000 mg | ORAL_TABLET | Freq: Every day | ORAL | Status: DC
  Administered 2023-06-20 – 2023-06-22 (×3): 12.5 mg via ORAL
  Filled 2023-06-19 (×3): qty 1

## 2023-06-19 MED ORDER — POTASSIUM CHLORIDE CRYS ER 20 MEQ PO TBCR
40.0000 meq | EXTENDED_RELEASE_TABLET | Freq: Once | ORAL | Status: AC
  Administered 2023-06-19: 40 meq via ORAL
  Filled 2023-06-19: qty 2

## 2023-06-19 MED ORDER — ALBUTEROL SULFATE (2.5 MG/3ML) 0.083% IN NEBU
10.0000 mg/h | INHALATION_SOLUTION | Freq: Once | RESPIRATORY_TRACT | Status: AC
  Administered 2023-06-19: 10 mg/h via RESPIRATORY_TRACT
  Filled 2023-06-19: qty 12

## 2023-06-19 MED ORDER — PROPYLENE GLYCOL (PF) 0.6 % OP SOLN: 1.0000 [drp] | Freq: Three times a day (TID) | OPHTHALMIC | Status: DC | PRN

## 2023-06-19 NOTE — H&P (Signed)
[place]   PATIENT NAME: Tracy Park    MR#:  [id number]  DATE OF BIRTH:  [birth year]  DATE OF ADMISSION:  06/19/2023  PRIMARY CARE PHYSICIAN: Jacquelin Hawking, PA-C   Patient is coming from: Home  REQUESTING/REFERRING PHYSICIAN: Smoot, Shawn Route, PA-C   CHIEF COMPLAINT:   Chief Complaint  Patient presents with   Shortness of Breath    HISTORY OF PRESENT ILLNESS:  Tracy Park is a 67 y.o. female with medical history significant for anxiety, bipolar disorder, COPD, hypothyroidism, paroxysmal atrial fibrillation on Eliquis, PTSD, MI, OSA and lung cancer status post left upper lobectomy, who presented to the emergency room with acute onset of worsening dyspnea with associated increasingly productive cough and wheezing for the last couple of days.  She denied any fever or chills.  No recent sick exposure.  No chest pain or palpitations.  No nausea or vomiting or abdominal pain.  She has not increased her baseline home O2 at 2 L/min.  She was increased here down to 3 L in the ER.  No dysuria, oliguria or hematuria or flank pain.  No bleeding diathesis.  She denies any neck pain or edema or recent travels or surgeries.  ED Course: When she came to the ER respiratory rate was 33 and pulse symmetry was 94% on 2 L of and heart percent on 6 L of O2 by nasal cannula.  Heart rate was 118 around.  Labs revealed mild hypokalemia of 3.1 and a blood glucose of 154 with calcium of 8.7.  CBC showed leukocytosis of 18.4.  Respiratory panel came back negative. EKG as reviewed by me : EKG showedSinus tachycardia with rate 120 with right atrial enlargement and LVH. Imaging: Portable chest x-ray showed left upper lobectomy and emphysema with no acute cardiopulmonary disease.  The patient was given 2 DuoNebs by EMS and IV Solu-Medrol as well as IV magnesium sulfate and 2 DuoNebs here as well as 1 nebulized albuterol continuously.  She will be admitted to a progressive unit observation bed for  further evaluation and management. PAST MEDICAL HISTORY:   Past Medical History:  Diagnosis Date   Anginal pain (HCC)    Anxiety    Bipolar disorder (HCC)    Cancer (HCC)    COPD (chronic obstructive pulmonary disease) (HCC)    Dyspnea    Family history of adverse reaction to anesthesia    History of kidney stones    Hydroureteronephrosis 08/16/2021   Hypothyroidism    Lung cancer (HCC)    Myocardial infarction (HCC)    Paroxysmal atrial fibrillation (HCC)    PTSD (post-traumatic stress disorder)    Sleep apnea    Thyroid disease     PAST SURGICAL HISTORY:   Past Surgical History:  Procedure Laterality Date   ABDOMINAL HYSTERECTOMY     BACK SURGERY     CYSTOSCOPY W/ URETERAL STENT PLACEMENT Right 05/03/2021   Procedure: CYSTOSCOPY WITH RETROGRADE PYELOGRAM/URETERAL STENT PLACEMENT;  Surgeon: Crist Fat, MD;  Location: [facility];  Service: Urology;  Laterality: Right;   EYE SURGERY     kidney stent     thyroidectomy    Left upper lobectomy for lung cancer  SOCIAL HISTORY:   Social History   Tobacco Use   Smoking status: Every Day    Current packs/day: 0.15    Types: Cigarettes   Smokeless tobacco: Never  Substance Use Topics   Alcohol use: Not Currently    FAMILY HISTORY:  Family History  Problem Relation Age of Onset   Hypertension Mother    Heart failure Mother    Hypertension Father    Diabetes Father    Heart failure Father     DRUG ALLERGIES:   Allergies  Allergen Reactions   Red Dye #40 (Allura Red) Hives, Itching and Other (See Comments)    Red food dye   Strawberry Extract Hives and Itching   Tomato Hives and Itching   Oxycontin [Oxycodone Hcl] Other (See Comments)    Hallucinations    Aspirin Hives   Tape Rash and Other (See Comments)    Prefers paper tape   Wound Dressing Adhesive Rash    REVIEW OF SYSTEMS:   ROS As per history of present illness. All pertinent systems were reviewed above. Constitutional, HEENT,  cardiovascular, respiratory, GI, GU, musculoskeletal, neuro, psychiatric, endocrine, integumentary and hematologic systems were reviewed and are otherwise negative/unremarkable except for positive findings mentioned above in the HPI.   MEDICATIONS AT HOME:   Prior to Admission medications   Medication Sig Start Date End Date Taking? Authorizing Provider  albuterol (PROVENTIL) (2.5 MG/3ML) 0.083% nebulizer solution Take 3 mLs (2.5 mg total) by nebulization every 6 (six) hours as needed for wheezing or shortness of breath. Patient taking differently: Take 2.5 mg by nebulization in the morning, at noon, and at bedtime. 01/30/23  Yes Tilden Fossa, MD  albuterol (VENTOLIN HFA) 108 (90 Base) MCG/ACT inhaler Inhale 2 puffs into the lungs every 6 (six) hours as needed for wheezing or shortness of breath. Patient taking differently: Inhale 2 puffs into the lungs every 4 (four) hours as needed for wheezing or shortness of breath. 01/13/23  Yes Burnadette Pop, MD  ALPRAZolam Prudy Feeler) 0.5 MG tablet Take 0.5 mg by mouth in the morning and at bedtime. 03/30/23  Yes [provider]  apixaban (ELIQUIS) 5 MG TABS tablet Take 1 tablet (5 mg total) by mouth 2 (two) times daily. 06/06/23  Yes Jonah Blue, MD  Budeson-Glycopyrrol-Formoterol (BREZTRI AEROSPHERE) 160-9-4.8 MCG/ACT AERO Inhale 1 puff into the lungs in the morning and at bedtime.   Yes [provider]  levothyroxine (SYNTHROID) 125 MCG tablet Take 125 mcg by mouth daily before breakfast.   Yes [provider]  losartan (COZAAR) 25 MG tablet Take 12.5 mg by mouth daily.   Yes [provider]  magnesium oxide (MAG-OX) 400 (240 Mg) MG tablet Take 400 mg by mouth in the morning.   Yes [provider]  metoprolol succinate (TOPROL-XL) 25 MG 24 hr tablet Take 12.5 mg by mouth daily.   Yes [provider]  nitroGLYCERIN (NITROSTAT) 0.4 MG SL tablet Place 0.4 mg under the tongue every 5 (five) minutes as  needed for chest pain.   Yes [provider]  OXYGEN Inhale 2 L/min into the lungs continuous.   Yes [provider]  rosuvastatin (CRESTOR) 20 MG tablet Take 20 mg by mouth daily.   Yes [provider]  sertraline (ZOLOFT) 25 MG tablet Take 25 mg by mouth in the morning.   Yes [provider]  SYSTANE COMPLETE PF 0.6 % SOLN Place 1 drop into both eyes 3 (three) times daily as needed (for dryness).   Yes [provider]      VITAL SIGNS:  Blood pressure 98/60, pulse (!) 103, temperature 98 F (36.7 C), temperature source Oral, resp. rate (!) 29, height 5\' 3"  (1.6 m), weight 45.4 kg, SpO2 95%.  PHYSICAL EXAMINATION:  Physical Exam  GENERAL:  67 y.o.-year-old patient lying in the bed with mild respiratory distress with conversational dyspnea. EYES: Pupils equal, round, reactive to light and accommodation. No scleral icterus. Extraocular muscles intact.  HEENT: Head atraumatic, normocephalic. Oropharynx and nasopharynx clear.  NECK:  Supple, no jugular venous distention. No thyroid enlargement, no tenderness.  LUNGS: Diffuse expiratory wheezes with tight expiratory airflow and harsh vesicular breathing.. No use of accessory muscles of respiration.  CARDIOVASCULAR: Regular rate and rhythm, S1, S2 normal. No murmurs, rubs, or gallops.  ABDOMEN: Soft, nondistended, nontender. Bowel sounds present. No organomegaly or mass.  EXTREMITIES: No pedal edema, cyanosis, or clubbing.  NEUROLOGIC: Cranial nerves II through XII are intact. Muscle strength 5/5 in all extremities. Sensation intact. Gait not checked.  PSYCHIATRIC: The patient is alert and oriented x 3.  Normal affect and good eye contact. SKIN: No obvious rash, lesion, or ulcer.   LABORATORY PANEL:   CBC Recent Labs  Lab 06/19/23 1501  WBC 18.4*  HGB 12.4  HCT 39.4  PLT 225    ------------------------------------------------------------------------------------------------------------------  Chemistries  Recent Labs  Lab 06/19/23 1501  NA 139  K 3.1*  CL 98  CO2 32  GLUCOSE 154*  BUN 11  CREATININE 0.56  CALCIUM 8.7*   ------------------------------------------------------------------------------------------------------------------  Cardiac Enzymes No results for input(s): "TROPONINI" in the last 168 hours. ------------------------------------------------------------------------------------------------------------------  RADIOLOGY:  DG Chest Portable 1 View  Result Date: 06/19/2023 CLINICAL DATA:  Shortness of breath EXAM: PORTABLE CHEST 1 VIEW COMPARISON:  Radiograph 06/04/2023 and CT chest 06/05/2023 FINDINGS: Postsurgical changes from left upper lobectomy with left apex opacification and leftward deviation of the superior mediastinum and trachea. No focal consolidation, pleural effusion, or pneumothorax. Chronic blunting of the left costophrenic angle. Hyperinflation and chronic bronchitic change. Stable cardiomediastinal silhouette. IMPRESSION: No acute cardiopulmonary process.  Emphysema.  Left upper lobectomy. Electronically Signed   By: Minerva Fester M.D.   On: 06/19/2023 19:06      IMPRESSION AND PLAN:  Assessment and Plan: * COPD exacerbation (HCC) - The patient will be admitted to a progressive unit bed. - We will place the patient IV steroid therapy with IV Solu-Medrol as well as nebulized bronchodilator therapy with duonebs q.i.d. and q.4 hours p.r.n.[name] - Mucolytic therapy will be provided with Mucinex and antibiotic therapy with IV Rocephin. - O2 protocol will be followed. - We will hold off long-acting beta agonist during her exacerbation.   Acute on chronic respiratory failure with hypoxia (HCC) - Management as above. - O2 protocol will be followed.  Paroxysmal atrial fibrillation (HCC) - We will continue Eliquis and  Toprol-XL.  Dyslipidemia - We will continue statin therapy.  Depression - We will continue Zoloft.   DVT prophylaxis: Eliquis. Advanced Care Planning:  Code Status: The patient is DNR only. Family Communication:  The plan of care was discussed in details with the patient (and family). I answered all questions. The patient agreed to proceed with the above mentioned plan. Further management will depend upon hospital course. Disposition Plan: Back to previous home environment Consults called: none.  All the records are reviewed and case discussed with ED provider.  Status is: Observation.  I certify that at the time of admission, it is my clinical judgment that the patient will require hospital care extending less than 2 midnights.                            Dispo: The patient is from: Home  Anticipated d/c is to: Home              Patient currently is not medically stable to d/c.              Difficult to place patient: No  Hannah Beat M.D on 06/19/2023 at 8:09 PM  [facility]   From 7 PM-7 AM, contact night-coverage [email or web address]  CC: Primary care physician; Jacquelin Hawking, PA-C

## 2023-06-19 NOTE — Assessment & Plan Note (Addendum)
-   The patient will be admitted to a progressive unit bed. - We will place the patient IV steroid therapy with IV Solu-Medrol as well as nebulized bronchodilator therapy with duonebs q.i.d. and q.4 hours p.r.n.[name] - Mucolytic therapy will be provided with Mucinex and antibiotic therapy with IV Rocephin. - O2 protocol will be followed. - We will hold off long-acting beta agonist during her exacerbation.

## 2023-06-19 NOTE — ED Notes (Addendum)
Pt is on 3L of [place]

## 2023-06-19 NOTE — Assessment & Plan Note (Signed)
-   We will continue statin therapy. 

## 2023-06-19 NOTE — ED Notes (Signed)
Pt given meal tray from dietary, tray set up at bedside, pt appreciated.

## 2023-06-19 NOTE — Assessment & Plan Note (Signed)
-   We will continue Eliquis and Toprol-XL. 

## 2023-06-19 NOTE — ED Provider Notes (Signed)
[place] EMERGENCY DEPARTMENT AT [facility] Provider Note   CSN: [id number] Arrival date & time: 06/19/23  1403     History  Chief Complaint  Patient presents with   Shortness of Breath    Tracy Park is a 67 y.o. female.  Patient with history of hypertension, hyperlipidemia, COPD with chronic oxygen dependence, lung cancer status post left upper lobectomy, afib on Elliquis presents today with complaints of shortness of breath. She states that same has been ongoing for the past 2 days. She has been attempting to manage with her home albuterol nebs without improvement. Does note a more productive cough today. Denies fevers or chills. No sick contacts. Several recent admissions for same, states that this feels similar to when she has required admission previously. She continues to smoke cigarettes daily. She is on 2 L O2 via [place] daily. Has not increased her oxygen. Called EMS who gave 2 duonebs and 2 albuterol nebs and 125 mg of solu-medrol with minimal improvement. Patient states she is only feeling minimal improvement. Denies any leg pain or leg swelling.   The history is provided by the patient. No language interpreter was used.  Shortness of Breath      Home Medications Prior to Admission medications   Medication Sig Start Date End Date Taking? Authorizing Provider  albuterol (PROVENTIL) (2.5 MG/3ML) 0.083% nebulizer solution Take 3 mLs (2.5 mg total) by nebulization every 6 (six) hours as needed for wheezing or shortness of breath. Patient taking differently: Take 2.5 mg by nebulization in the morning, at noon, and at bedtime. 01/30/23   Tilden Fossa, MD  albuterol (VENTOLIN HFA) 108 (90 Base) MCG/ACT inhaler Inhale 2 puffs into the lungs every 6 (six) hours as needed for wheezing or shortness of breath. Patient taking differently: Inhale 2 puffs into the lungs in the morning, at noon, in the evening, and at bedtime. 01/13/23   Burnadette Pop, MD  ALPRAZolam  Prudy Feeler) 0.5 MG tablet Take 0.5 mg by mouth 2 (two) times daily as needed for anxiety. 03/30/23   [provider]  apixaban (ELIQUIS) 5 MG TABS tablet Take 1 tablet (5 mg total) by mouth 2 (two) times daily. 06/06/23   Jonah Blue, MD  Budeson-Glycopyrrol-Formoterol (BREZTRI AEROSPHERE) 160-9-4.8 MCG/ACT AERO Inhale 1 puff into the lungs in the morning and at bedtime.    [provider]  levothyroxine (SYNTHROID) 125 MCG tablet Take 125 mcg by mouth daily before breakfast.    [provider]  magnesium oxide (MAG-OX) 400 (240 Mg) MG tablet Take 400 mg by mouth in the morning.    [provider]  metoprolol succinate (TOPROL-XL) 25 MG 24 hr tablet Take 12.5 mg by mouth daily.    [provider]  nitroGLYCERIN (NITROSTAT) 0.4 MG SL tablet Place 0.4 mg under the tongue every 5 (five) minutes as needed for chest pain.    [provider]  omeprazole (PRILOSEC OTC) 20 MG tablet Take 20 mg by mouth daily before breakfast.    [provider]  OXYGEN Inhale 2 L/min into the lungs continuous.    [provider]  rosuvastatin (CRESTOR) 20 MG tablet Take 20 mg by mouth daily.    [provider]  sertraline (ZOLOFT) 25 MG tablet Take 25 mg by mouth in the morning.    [provider]  SYSTANE COMPLETE PF 0.6 % SOLN Place 1 drop into both eyes 3 (three) times daily as needed (for dryness).    [provider]  TYLENOL 500 MG tablet Take 1,000 mg by mouth every 6 (six) hours as needed for mild pain or headache.    [provider]      Allergies    Red dye, Red dye #40 (allura red), Strawberry extract, Tomato, Oxycontin [oxycodone hcl], Aspirin, Tape, and Wound dressing adhesive    Review of Systems   Review of Systems  Respiratory:  Positive for shortness of breath.   All other systems reviewed and are negative.   Physical Exam Updated Vital Signs BP 110/62   Pulse (!) 110   Temp 98.5 F (36.9 C)  (Oral)   Resp (!) 27   Ht 5\' 3"  (1.6 m)   Wt 45.4 kg   SpO2 100%   BMI 17.71 kg/m  Physical Exam Vitals and nursing note reviewed.  Constitutional:      General: She is not in acute distress.    Appearance: Normal appearance. She is normal weight. She is not ill-appearing, toxic-appearing or diaphoretic.  HENT:     Head: Normocephalic and atraumatic.  Cardiovascular:     Rate and Rhythm: Regular rhythm. Tachycardia present.     Heart sounds: Normal heart sounds.  Pulmonary:     Effort: Tachypnea present. No respiratory distress.     Breath sounds: Wheezing present.     Comments: Inspiratory and expiratory wheezes present throughout Abdominal:     Palpations: Abdomen is soft.     Tenderness: There is no abdominal tenderness.  Musculoskeletal:        General: Normal range of motion.     Cervical back: Normal range of motion.     Right lower leg: No tenderness. No edema.     Left lower leg: No tenderness. No edema.  Skin:    General: Skin is warm and dry.  Neurological:     General: No focal deficit present.     Mental Status: She is alert.  Psychiatric:        Mood and Affect: Mood normal.        Behavior: Behavior normal.    ED Results / Procedures / Treatments   Labs (all labs ordered are listed, but only abnormal results are displayed) Labs Reviewed  CBC - Abnormal; Notable for the following components:      Result Value   WBC 18.4 (*)    All other components within normal limits  BASIC METABOLIC PANEL - Abnormal; Notable for the following components:   Potassium 3.1 (*)    Glucose, Bld 154 (*)    Calcium 8.7 (*)    All other components within normal limits  RESP PANEL BY RT-PCR (RSV, FLU A&B, COVID)  RVPGX2  TROPONIN I (HIGH SENSITIVITY)    EKG EKG Interpretation Date/Time:  Thursday June 19 2023 14:33:41 EDT Ventricular Rate:  120 PR Interval:  146 QRS Duration:  107 QT Interval:  342 QTC Calculation: 484 R Axis:   -77  Text  Interpretation: Sinus tachycardia Consider right atrial enlargement Markedly posterior QRS axis RSR' in V1 or V2, probably normal variant Left ventricular hypertrophy Borderline ST elevation, lateral leads Confirmed by Estanislado Pandy [phone]) on 06/19/2023 4:26:41 PM  Radiology No results found.  Procedures Procedures    Medications Ordered in ED Medications  potassium chloride SA (KLOR-CON M) CR tablet 40 mEq (has no administration in time range)  albuterol (PROVENTIL) (2.5 MG/3ML) 0.083% nebulizer solution (10 mg/hr Nebulization Given 06/19/23 1515)  magnesium sulfate IVPB 2 g 50 mL (0 g Intravenous Stopped 06/19/23 1557)  ED Course/ Medical Decision Making/ A&P                                 Medical Decision Making Amount and/or Complexity of Data Reviewed Labs: ordered. Radiology: ordered.  Risk Prescription drug management. Decision regarding hospitalization.   This patient is a 67 y.o. female who presents to the ED for concern of shortness of breath, this involves an extensive number of treatment options, and is a complaint that carries with it a high risk of complications and morbidity. The emergent differential diagnosis prior to evaluation includes, but is not limited to,  CHF, pericardial effusion/tamponade, arrhythmias, ACS, COPD, asthma, bronchitis, pneumonia, pneumothorax, PE, anemia   This is not an exhaustive differential.   Past Medical History / Co-morbidities / Social History:  has a past medical history of Anginal pain (HCC), Anxiety, Bipolar disorder (HCC), Cancer (HCC), COPD (chronic obstructive pulmonary disease) (HCC), Dyspnea, Family history of adverse reaction to anesthesia, History of kidney stones, Hydroureteronephrosis (08/16/2021), Hypothyroidism, Lung cancer (HCC), Myocardial infarction (HCC), Paroxysmal atrial fibrillation (HCC), PTSD (post-traumatic stress disorder), Sleep apnea, and Thyroid disease.  Additional history: Chart reviewed. Pertinent  results include: multiple previous admissions for COPD exacerbation  Physical Exam: Physical exam performed. The pertinent findings include: Patient tachypneic and tachycardic, unable to complete a sentence without dyspnea. On 2L O2 via [place] per baseline  Lab Tests: I ordered, and personally interpreted labs.  The pertinent results include:  WBC 18.4 likely due to patients albuterol use. K 3.1, likely related to albuterol as well. Trop wnl, RVP negative.    Imaging Studies: I ordered imaging studies including CXR. I independently visualized and interpreted imaging which showed   No acute cardiopulmonary process. Emphysema. Left upper lobectomy.   I agree with the radiologist interpretation.   Cardiac Monitoring:  The patient was maintained on a cardiac monitor.  My attending physician Dr. Maple Hudson viewed and interpreted the cardiac monitored which showed an underlying rhythm of: sinus tachycardia, no STEMI. I agree with this interpretation.   Medications: I ordered medication including magnesium, continuous neb  for COPD exacerbation on top of the Solu-Medrol and 4 duonebs patient received by EMS prior to arrival. Also given oral potassium for hypokalemia. Reevaluation of the patient after these medicines showed that the patient stayed the same. I have reviewed the patients home medicines and have made adjustments as needed.   Disposition: After consideration of the diagnostic results and the patients response to treatment, I feel that patient will require admission for COPD exacerbation. Patient notes minimal improvement with treatment in the ER, is requesting admission. Continues to be tachypneic and tachycardic. Remains on home O2 requirement at this time.  Discussed patient with hospitalist Dr. Arville Care who accepts patient for admission.  I discussed this case with my attending physician Dr. Maple Hudson who cosigned this note including patient's presenting symptoms, physical exam, and planned  diagnostics and interventions. Attending physician stated agreement with plan or made changes to plan which were implemented.    Final Clinical Impression(s) / ED Diagnoses Final diagnoses:  COPD exacerbation (HCC)  Hypokalemia    Rx / DC Orders ED Discharge Orders     None         Silva Bandy, PA-C 06/19/23 2256    Coral Spikes, DO 06/20/23 [phone]

## 2023-06-19 NOTE — Assessment & Plan Note (Signed)
-   Management as above. - O2 protocol will be followed.

## 2023-06-19 NOTE — ED Notes (Signed)
Pt given coffee as requested with cream and sugar. No other needs voiced, lights turned down for comfort, pt refused TV on, call bell within reach.

## 2023-06-19 NOTE — Assessment & Plan Note (Signed)
-   We will continue Zoloft 

## 2023-06-19 NOTE — ED Triage Notes (Signed)
Pt BIBA from home, c/o of SOB. Hx of COPD. She used 3 albuterol nebs since 2 am at home, fire department gave 1 albuterol neb, ems did 2 duo nebs and 1 albuterol.  New productive cough for 2 days.  Chest pain whenever coughing. 125 of methylprednisolone en route.    BP 117/75 HR 118 O2 88 2L at home CBG 104

## 2023-06-20 DIAGNOSIS — F4310 Post-traumatic stress disorder, unspecified: Secondary | ICD-10-CM | POA: Diagnosis present

## 2023-06-20 DIAGNOSIS — E876 Hypokalemia: Secondary | ICD-10-CM | POA: Diagnosis present

## 2023-06-20 DIAGNOSIS — Z1152 Encounter for screening for COVID-19: Secondary | ICD-10-CM | POA: Diagnosis not present

## 2023-06-20 DIAGNOSIS — K219 Gastro-esophageal reflux disease without esophagitis: Secondary | ICD-10-CM | POA: Diagnosis present

## 2023-06-20 DIAGNOSIS — Z7901 Long term (current) use of anticoagulants: Secondary | ICD-10-CM | POA: Diagnosis not present

## 2023-06-20 DIAGNOSIS — I10 Essential (primary) hypertension: Secondary | ICD-10-CM | POA: Diagnosis present

## 2023-06-20 DIAGNOSIS — F17210 Nicotine dependence, cigarettes, uncomplicated: Secondary | ICD-10-CM | POA: Diagnosis present

## 2023-06-20 DIAGNOSIS — D72829 Elevated white blood cell count, unspecified: Secondary | ICD-10-CM | POA: Diagnosis present

## 2023-06-20 DIAGNOSIS — Z79890 Hormone replacement therapy (postmenopausal): Secondary | ICD-10-CM | POA: Diagnosis not present

## 2023-06-20 DIAGNOSIS — Z66 Do not resuscitate: Secondary | ICD-10-CM | POA: Diagnosis present

## 2023-06-20 DIAGNOSIS — Z9981 Dependence on supplemental oxygen: Secondary | ICD-10-CM | POA: Diagnosis not present

## 2023-06-20 DIAGNOSIS — Z833 Family history of diabetes mellitus: Secondary | ICD-10-CM | POA: Diagnosis not present

## 2023-06-20 DIAGNOSIS — Z8249 Family history of ischemic heart disease and other diseases of the circulatory system: Secondary | ICD-10-CM | POA: Diagnosis not present

## 2023-06-20 DIAGNOSIS — E785 Hyperlipidemia, unspecified: Secondary | ICD-10-CM | POA: Diagnosis present

## 2023-06-20 DIAGNOSIS — J9621 Acute and chronic respiratory failure with hypoxia: Secondary | ICD-10-CM | POA: Diagnosis present

## 2023-06-20 DIAGNOSIS — Z902 Acquired absence of lung [part of]: Secondary | ICD-10-CM | POA: Diagnosis not present

## 2023-06-20 DIAGNOSIS — I252 Old myocardial infarction: Secondary | ICD-10-CM | POA: Diagnosis not present

## 2023-06-20 DIAGNOSIS — J441 Chronic obstructive pulmonary disease with (acute) exacerbation: Secondary | ICD-10-CM | POA: Diagnosis present

## 2023-06-20 DIAGNOSIS — F319 Bipolar disorder, unspecified: Secondary | ICD-10-CM | POA: Diagnosis present

## 2023-06-20 DIAGNOSIS — Z801 Family history of malignant neoplasm of trachea, bronchus and lung: Secondary | ICD-10-CM | POA: Diagnosis not present

## 2023-06-20 DIAGNOSIS — Z85118 Personal history of other malignant neoplasm of bronchus and lung: Secondary | ICD-10-CM | POA: Diagnosis not present

## 2023-06-20 DIAGNOSIS — E890 Postprocedural hypothyroidism: Secondary | ICD-10-CM | POA: Diagnosis present

## 2023-06-20 DIAGNOSIS — I480 Paroxysmal atrial fibrillation: Secondary | ICD-10-CM | POA: Diagnosis present

## 2023-06-20 DIAGNOSIS — Z87442 Personal history of urinary calculi: Secondary | ICD-10-CM | POA: Diagnosis not present

## 2023-06-20 LAB — BASIC METABOLIC PANEL
Anion gap: 9 (ref 5–15)
BUN: 19 mg/dL (ref 8–23)
CO2: 30 mmol/L (ref 22–32)
Calcium: 8.8 mg/dL — ABNORMAL LOW (ref 8.9–10.3)
Chloride: 98 mmol/L (ref 98–111)
Creatinine, Ser: 0.51 mg/dL (ref 0.44–1.00)
GFR, Estimated: >60 mL/min (ref >60)
Glucose, Bld: 137 mg/dL — ABNORMAL HIGH (ref 70–99)
Potassium: 4.7 mmol/L (ref 3.5–5.1)
Sodium: 137 mmol/L (ref 135–145)

## 2023-06-20 LAB — CBC
HCT: 39.5 % (ref 36.0–46.0)
Hemoglobin: 11.9 g/dL — ABNORMAL LOW (ref 12.0–15.0)
MCH: 29.5 pg (ref 26.0–34.0)
MCHC: 30.1 g/dL (ref 30.0–36.0)
MCV: 97.8 fL (ref 80.0–100.0)
Platelets: 222 10*3/uL (ref 150–400)
RBC: 4.04 MIL/uL (ref 3.87–5.11)
RDW: 14.6 % (ref 11.5–15.5)
WBC: 12.6 10*3/uL — ABNORMAL HIGH (ref 4.0–10.5)
nRBC: 0 % (ref 0.0–0.2)

## 2023-06-20 LAB — EXPECTORATED SPUTUM ASSESSMENT W GRAM STAIN, RFLX TO RESP C

## 2023-06-20 MED ORDER — METHYLPREDNISOLONE SODIUM SUCC 40 MG IJ SOLR
40.0000 mg | Freq: Two times a day (BID) | INTRAMUSCULAR | Status: DC
  Administered 2023-06-20 – 2023-06-22 (×5): 40 mg via INTRAVENOUS
  Filled 2023-06-20 (×5): qty 1

## 2023-06-20 MED ORDER — IPRATROPIUM-ALBUTEROL 0.5-2.5 (3) MG/3ML IN SOLN
3.0000 mL | Freq: Four times a day (QID) | RESPIRATORY_TRACT | Status: DC
  Administered 2023-06-20 – 2023-06-22 (×8): 3 mL via RESPIRATORY_TRACT
  Filled 2023-06-20 (×7): qty 3

## 2023-06-20 MED ORDER — ALBUTEROL SULFATE (2.5 MG/3ML) 0.083% IN NEBU
2.5000 mg | INHALATION_SOLUTION | RESPIRATORY_TRACT | Status: DC | PRN
  Administered 2023-06-21 – 2023-06-22 (×2): 2.5 mg via RESPIRATORY_TRACT
  Filled 2023-06-20 (×2): qty 3

## 2023-06-20 MED ORDER — GUAIFENESIN ER 600 MG PO TB12
1200.0000 mg | ORAL_TABLET | Freq: Two times a day (BID) | ORAL | Status: DC
  Administered 2023-06-20 – 2023-06-22 (×5): 1200 mg via ORAL
  Filled 2023-06-20 (×5): qty 2

## 2023-06-20 NOTE — ED Notes (Signed)
Receiving RN Kerrin Champagne has agreed to accept [facility] once pt has arrived to inpatient unit, all questions and concerns address.

## 2023-06-20 NOTE — Evaluation (Signed)
Physical Therapy Evaluation Patient Details Name: Tracy Park MRN: [id number] DOB: 24-Jun-1956 Today's Date: 06/20/2023  History of Present Illness  67yo with h/o bipolar d/o, COPD on 2L home O2, and PAF on Eliquis who presented on 9/25 with cough and SOB c/w COPD exacerbation.  She has had 10 admissions and 4 ER visits in the last 6 months, primarily associated with COPD exacerbations,  Clinical Impression  Pt admitted with above diagnosis. Pt ambulated 60' without assistive device (pt refused RW) with 2 standing rest breaks 2* 3/4 dyspnea, SpO2 91% on 3L O2 with walking. She reports she has needed level of assistance available from her daughter upon acute DC.  Pt currently with functional limitations due to the deficits listed below (see PT Problem List). Pt will benefit from acute skilled PT to increase their independence and safety with mobility to allow discharge.           If plan is discharge home, recommend the following: Assist for transportation;Assistance with cooking/housework   Can travel by private vehicle        Equipment Recommendations None recommended by PT (pt declined RW)  Recommendations for Other Services       Functional Status Assessment Patient has had a recent decline in their functional status and demonstrates the ability to make significant improvements in function in a reasonable and predictable amount of time.     Precautions / Restrictions Precautions Precautions: Fall Precaution Comments: denies falls over past 6 months but stated she's had "close calls" losing her balance; monitor O2 Restrictions Weight Bearing Restrictions: No      Mobility  Bed Mobility Overal bed mobility: Modified Independent             General bed mobility comments: HOB up    Transfers Overall transfer level: Needs assistance   Transfers: Sit to/from Stand Sit to Stand: Contact guard assist           General transfer comment: min guard for safety 2*  mild unsteadiness    Ambulation/Gait Ambulation/Gait assistance: Supervision Gait Distance (Feet): 60 Feet Assistive device: None Gait Pattern/deviations: Decreased stride length Gait velocity: WFL     General Gait Details: 2 standing rest breaks 2* 3/4 dyspnea, productive cough at end of session. SpO2 91% on 3L O2 walking, VCs for pursed lip breathing as pt tends to inhale through mouth, pt declined trial of RW bc there isn't room for one at her daughter's home where she plan's to DC  Stairs            Wheelchair Mobility     Tilt Bed    Modified Rankin (Stroke Patients Only)       Balance Overall balance assessment: Mild deficits observed, not formally tested                                           Pertinent Vitals/Pain Pain Assessment Pain Assessment: No/denies pain    Home Living Family/patient expects to be discharged to:: Private residence Living Arrangements: Alone Available Help at Discharge: Family;Available PRN/intermittently Type of Home: House Home Access: Stairs to enter Entrance Stairs-Rails: Right Entrance Stairs-Number of Steps: 3   Home Layout: One level Home Equipment: Cane - single point Additional Comments: On 2 L O2 at home 24/7; can DC to her daughter's home which has several steps to enter with handrail    Prior Function  Prior Level of Function : Independent/Modified Independent             Mobility Comments: does not normally amb with device ADLs Comments: Ind with ADLs/selfcare, IADLs, home mgt, cooking, no longer drives bc she does not have a car     Extremity/Trunk Assessment   Upper Extremity Assessment Upper Extremity Assessment: Overall WFL for tasks assessed    Lower Extremity Assessment Lower Extremity Assessment: Overall WFL for tasks assessed    Cervical / Trunk Assessment Cervical / Trunk Assessment: Normal  Communication   Communication Communication: No apparent difficulties   Cognition Arousal: Alert Behavior During Therapy: WFL for tasks assessed/performed Overall Cognitive Status: Within Functional Limits for tasks assessed                                          General Comments      Exercises     Assessment/Plan    PT Assessment Patient needs continued PT services  PT Problem List Decreased activity tolerance;Cardiopulmonary status limiting activity;Decreased balance;Decreased mobility       PT Treatment Interventions Gait training;Therapeutic activities;Therapeutic exercise;Patient/family education;Functional mobility training    PT Goals (Current goals can be found in the Care Plan section)  Acute Rehab PT Goals Patient Stated Goal: to walk farther PT Goal Formulation: With patient Time For Goal Achievement: 07/04/23 Potential to Achieve Goals: Good    Frequency Min 1X/week     Co-evaluation               AM-PAC PT "6 Clicks" Mobility  Outcome Measure Help needed turning from your back to your side while in a flat bed without using bedrails?: None Help needed moving from lying on your back to sitting on the side of a flat bed without using bedrails?: A Little Help needed moving to and from a bed to a chair (including a wheelchair)?: A Little Help needed standing up from a chair using your arms (e.g., wheelchair or bedside chair)?: A Little Help needed to walk in hospital room?: A Little Help needed climbing 3-5 steps with a railing? : A Little 6 Click Score: 19    End of Session Equipment Utilized During Treatment: Oxygen Activity Tolerance: Patient limited by fatigue Patient left: in bed;with call bell/phone within reach Nurse Communication: Mobility status PT Visit Diagnosis: Difficulty in walking, not elsewhere classified (R26.2)    Time: 1610-9604 PT Time Calculation (min) (ACUTE ONLY): 25 min   Charges:   PT Evaluation $PT Eval Moderate Complexity: 1 Mod PT Treatments $Gait Training: 8-22  mins PT General Charges $$ ACUTE PT VISIT: 1 Visit         Tamala Ser PT 06/20/2023  Acute Rehabilitation Services  Office [phone]

## 2023-06-20 NOTE — Progress Notes (Signed)
PROGRESS NOTE    Tracy Park  JJK:[id number] DOB: 08/09/1956 DOA: 06/19/2023 PCP: Jacquelin Hawking, PA-C   Brief Narrative: Tracy Park is a 67 y.o. female with a history of bipolar disorder, anxiety, COPD, paroxysmal atrial fibrillation, PTSD, MI, OSA and lung cancer s/p left upper lobectomy.  Patient presented secondary to shortness of breath and was found to have a COPD exacerbation with associated acute on chronic hypoxia. Patient started on steroids and bronchodilator therapy.   Assessment and Plan:  COPD exacerbation -Continue Solu-medrol, Duonebs -Sputum culture  Acute on chronic respiratory failure with hypoxia Patient is on 2 L/min of supplemental oxygen at baseline. Acute hypoxia secondary to COPD exacerbation. Patient requiring up to 6 L/min on admission, weaned down to 2 L/min -Wean to room air as able -Ambulatory pulse ox prior to discharge  Paroxysmal atrial fibrillation -Continue Eliqus and Toprol XL  Hyperlipidemia -Continue Crestor  GERD -Continue Protonix  Depression -Continue Zoloft   DVT prophylaxis: Eliquis Code Status:   Code Status: Do not attempt resuscitation (DNR) PRE-ARREST INTERVENTIONS DESIRED Family Communication: None at bedside Disposition Plan: Discharge home likely in 24 hours if functional capacity improves   Consultants:  None  Procedures:  None  Antimicrobials: Ceftriaxone    Subjective: Patient reports some improvement from admission.  Objective: BP 106/73   Pulse 79   Temp 98.1 F (36.7 C) (Oral)   Resp (!) 22   Ht 5\' 3"  (1.6 m)   Wt 45.4 kg   SpO2 98%   BMI 17.71 kg/m   Examination:  General exam: Appears calm and comfortable Respiratory system: Diffuse wheezing. Respiratory effort normal. Cardiovascular system: S1 & S2 heard, RRR. Gastrointestinal system: Abdomen is nondistended, soft and nontender. Normal bowel sounds heard. Central nervous system: Alert and oriented. No focal neurological  deficits. Musculoskeletal: No edema. No calf tenderness Skin: No cyanosis. No rashes Psychiatry: Judgement and insight appear normal. Mood & affect appropriate.    Data Reviewed: I have personally reviewed following labs and imaging studies  CBC Lab Results  Component Value Date   WBC 12.6 (H) 06/20/2023   RBC 4.04 06/20/2023   HGB 11.9 (L) 06/20/2023   HCT 39.5 06/20/2023   MCV 97.8 06/20/2023   MCH 29.5 06/20/2023   PLT 222 06/20/2023   MCHC 30.1 06/20/2023   RDW 14.6 06/20/2023   LYMPHSABS 2.4 06/06/2023   MONOABS 1.0 06/06/2023   EOSABS 0.0 06/06/2023   BASOSABS 0.0 06/06/2023     Last metabolic panel Lab Results  Component Value Date   NA 137 06/20/2023   K 4.7 06/20/2023   CL 98 06/20/2023   CO2 30 06/20/2023   BUN 19 06/20/2023   CREATININE 0.51 06/20/2023   GLUCOSE 137 (H) 06/20/2023   GFRNONAA >60 06/20/2023   GFRAA >60 04/12/2020   CALCIUM 8.8 (L) 06/20/2023   PHOS 3.7 03/09/2023   PROT 5.8 (L) 04/12/2023   ALBUMIN 3.0 (L) 04/12/2023   BILITOT 0.3 04/12/2023   ALKPHOS 53 04/12/2023   AST 14 (L) 04/12/2023   ALT 18 04/12/2023   ANIONGAP 9 06/20/2023    GFR: Estimated Creatinine Clearance: 50.2 mL/min (by C-G formula based on SCr of 0.51 mg/dL).  Recent Results (from the past 240 hour(s))  Resp panel by RT-PCR (RSV, Flu A&B, Covid) Anterior Nasal Swab     Status: None   Collection Time: 06/19/23  3:01 PM   Specimen: Anterior Nasal Swab  Result Value Ref Range Status   SARS Coronavirus 2 by  RT PCR NEGATIVE NEGATIVE Final    Comment: (NOTE) SARS-CoV-2 target nucleic acids are NOT DETECTED.  The SARS-CoV-2 RNA is generally detectable in upper respiratory specimens during the acute phase of infection. The lowest concentration of SARS-CoV-2 viral copies this assay can detect is 138 copies/mL. A negative result does not preclude SARS-Cov-2 infection and should not be used as the sole basis for treatment or other patient management decisions. A  negative result may occur with  improper specimen collection/handling, submission of specimen other than nasopharyngeal swab, presence of viral mutation(s) within the areas targeted by this assay, and inadequate number of viral copies(<138 copies/mL). A negative result must be combined with clinical observations, patient history, and epidemiological information. The expected result is Negative.  Fact Sheet for Patients:  BloggerCourse.com  Fact Sheet for Healthcare Providers:  SeriousBroker.it  This test is no t yet approved or cleared by the [place] FDA and  has been authorized for detection and/or diagnosis of SARS-CoV-2 by FDA under an Emergency Use Authorization (EUA). This EUA will remain  in effect (meaning this test can be used) for the duration of the COVID-19 declaration under Section 564(b)(1) of the Act, 21 U.S.C.section 360bbb-3(b)(1), unless the authorization is terminated  or revoked sooner.       Influenza A by PCR NEGATIVE NEGATIVE Final   Influenza B by PCR NEGATIVE NEGATIVE Final    Comment: (NOTE) The Xpert Xpress SARS-CoV-2/FLU/RSV plus assay is intended as an aid in the diagnosis of influenza from Nasopharyngeal swab specimens and should not be used as a sole basis for treatment. Nasal washings and aspirates are unacceptable for Xpert Xpress SARS-CoV-2/FLU/RSV testing.  Fact Sheet for Patients: BloggerCourse.com  Fact Sheet for Healthcare Providers: SeriousBroker.it  This test is not yet approved or cleared by the [place] FDA and has been authorized for detection and/or diagnosis of SARS-CoV-2 by FDA under an Emergency Use Authorization (EUA). This EUA will remain in effect (meaning this test can be used) for the duration of the COVID-19 declaration under Section 564(b)(1) of the Act, 21 U.S.C. section 360bbb-3(b)(1), unless the authorization  is terminated or revoked.     Resp Syncytial Virus by PCR NEGATIVE NEGATIVE Final    Comment: (NOTE) Fact Sheet for Patients: BloggerCourse.com  Fact Sheet for Healthcare Providers: SeriousBroker.it  This test is not yet approved or cleared by the [place] FDA and has been authorized for detection and/or diagnosis of SARS-CoV-2 by FDA under an Emergency Use Authorization (EUA). This EUA will remain in effect (meaning this test can be used) for the duration of the COVID-19 declaration under Section 564(b)(1) of the Act, 21 U.S.C. section 360bbb-3(b)(1), unless the authorization is terminated or revoked.  Performed at [facility], [address] [place], [place] [address]       Radiology Studies: DG Chest Portable 1 View  Result Date: 06/19/2023 CLINICAL DATA:  Shortness of breath EXAM: PORTABLE CHEST 1 VIEW COMPARISON:  Radiograph 06/04/2023 and CT chest 06/05/2023 FINDINGS: Postsurgical changes from left upper lobectomy with left apex opacification and leftward deviation of the superior mediastinum and trachea. No focal consolidation, pleural effusion, or pneumothorax. Chronic blunting of the left costophrenic angle. Hyperinflation and chronic bronchitic change. Stable cardiomediastinal silhouette. IMPRESSION: No acute cardiopulmonary process.  Emphysema.  Left upper lobectomy. Electronically Signed   By: Minerva Fester M.D.   On: 06/19/2023 19:06      LOS: 1 day    Jacquelin Hawking, MD [facility] 06/20/2023, 7:54 AM  If 7PM-7AM, please contact night-coverage [email or web address]

## 2023-06-20 NOTE — Progress Notes (Signed)
Mobility Specialist - Progress Note  Pre-mobility: 88 bpm HR, 92% SpO2 During mobility: 102 bpm HR, 93% SpO2 Post-mobility: 97 bpm HR, 90% SPO2   06/20/23 1221  Oxygen Therapy  O2 Device Nasal Cannula  O2 Flow Rate (L/min) 2 L/min  Patient Activity (if Appropriate) Ambulating  Mobility  Activity Ambulated independently in hallway  Level of Assistance Contact guard assist, steadying assist  Assistive Device Other (Comment) (Hallway Rails)  Distance Ambulated (ft) 100 ft  Range of Motion/Exercises Active  Activity Response Tolerated fair  Mobility Referral Yes  $Mobility charge 1 Mobility  Mobility Specialist Start Time (ACUTE ONLY) 1200  Mobility Specialist Stop Time (ACUTE ONLY) 1221  Mobility Specialist Time Calculation (min) (ACUTE ONLY) 21 min   Pt was found in bed and agreeable to ambulate after using BSC. During session pt stated feeling SOB and had a harder work of breathing which limited distance. Pt stated feeling burning sensation from her chest. At EOS returned to bed with all needs met. Call bell in reach and RN notified.  Billey Chang Mobility Specialist

## 2023-06-20 NOTE — Plan of Care (Signed)
  Problem: Education: Goal: Knowledge of disease or condition will improve Outcome: Progressing   Problem: Activity: Goal: Ability to tolerate increased activity will improve Outcome: Progressing Goal: Will verbalize the importance of balancing activity with adequate rest periods Outcome: Progressing   Problem: Respiratory: Goal: Levels of oxygenation will improve Outcome: Progressing   Problem: Education: Goal: Knowledge of General Education information will improve Description: Including pain rating scale, medication(s)/side effects and non-pharmacologic comfort measures Outcome: Progressing

## 2023-06-20 NOTE — Progress Notes (Signed)
SATURATION QUALIFICATIONS: (This note is used to comply with regulatory documentation for home oxygen)  Patient Saturations on 2 Liters at Rest = 93%  Patient Saturations on 2 Liters of oxygen while Ambulating = 90%  Please briefly explain why patient needs home oxygen:  Patient saturations remained >90% while ambulating at baseline 2 Liters of oxygen via nasal cannula. Patient did exhibit increased work of breathing with ambulating about 100 feet.

## 2023-06-20 NOTE — ED Notes (Signed)
ED TO INPATIENT HANDOFF REPORT  ED Nurse Name and Phone #: [phone] [facility]  S Name/Age/Gender Tracy Park 67 y.o. female Room/Bed: WA06/WA06  Code Status   Code Status: Do not attempt resuscitation (DNR) PRE-ARREST INTERVENTIONS DESIRED  Home/SNF/Other Home Patient oriented to: self, place, time, and situation Is this baseline? Yes   Triage Complete: Triage complete  Chief Complaint Acute on chronic respiratory failure with hypoxia (HCC) [J96.21] COPD exacerbation (HCC) [J44.1]  Triage Note Pt BIBA from home, c/o of SOB. Hx of COPD. She used 3 albuterol nebs since 2 am at home, fire department gave 1 albuterol neb, ems did 2 duo nebs and 1 albuterol.  New productive cough for 2 days.  Chest pain whenever coughing. 125 of methylprednisolone en route.    BP 117/75 HR 118 O2 88 2L at home CBG 104    Allergies Allergies  Allergen Reactions   Red Dye #40 (Allura Red) Hives, Itching and Other (See Comments)    Red food dye   Strawberry Extract Hives and Itching   Tomato Hives and Itching   Oxycontin [Oxycodone Hcl] Other (See Comments)    Hallucinations    Aspirin Hives   Tape Rash and Other (See Comments)    Prefers paper tape   Wound Dressing Adhesive Rash    Level of Care/Admitting Diagnosis ED Disposition     ED Disposition  Admit   Condition  --   Comment  Hospital Area: [facility] [[id number]]  Level of Care: Progressive [102]  Admit to Progressive based on following criteria: Other see comments  Comments: Acute on chronic hypoxic respiratory failure  May place patient in observation at [facility] or [place] if equivalent level of care is available:: No  Covid Evaluation: Asymptomatic - no recent exposure (last 10 days) testing not required  Diagnosis: COPD exacerbation [facility]) [[id number]]  Admitting Physician: Hannah Beat [[id number]]  Attending Physician: Hannah Beat [[id number]]          B Medical/Surgery History Past Medical  History:  Diagnosis Date   Anginal pain (HCC)    Anxiety    Bipolar disorder (HCC)    Cancer (HCC)    COPD (chronic obstructive pulmonary disease) (HCC)    Dyspnea    Family history of adverse reaction to anesthesia    History of kidney stones    Hydroureteronephrosis 08/16/2021   Hypothyroidism    Lung cancer (HCC)    Myocardial infarction (HCC)    Paroxysmal atrial fibrillation (HCC)    PTSD (post-traumatic stress disorder)    Sleep apnea    Thyroid disease    Past Surgical History:  Procedure Laterality Date   ABDOMINAL HYSTERECTOMY     BACK SURGERY     CYSTOSCOPY W/ URETERAL STENT PLACEMENT Right 05/03/2021   Procedure: CYSTOSCOPY WITH RETROGRADE PYELOGRAM/URETERAL STENT PLACEMENT;  Surgeon: Crist Fat, MD;  Location: [facility];  Service: Urology;  Laterality: Right;   EYE SURGERY     kidney stent     thyroidectomy       A IV Location/Drains/Wounds Patient Lines/Drains/Airways Status     Active Line/Drains/Airways     Name Placement date Placement time Site Days   Peripheral IV 06/19/23 20 G Anterior;Right Forearm 06/19/23  1430  Forearm  1            Intake/Output Last 24 hours  Intake/Output Summary (Last 24 hours) at 06/20/2023 0248 Last data filed at 06/19/2023 1921 Gross per 24 hour  Intake 100  ml  Output --  Net 100 ml    Labs/Imaging Results for orders placed or performed during the hospital encounter of 06/19/23 (from the past 48 hour(s))  CBC     Status: Abnormal   Collection Time: 06/19/23  3:01 PM  Result Value Ref Range   WBC 18.4 (H) 4.0 - 10.5 K/uL   RBC 4.10 3.87 - 5.11 MIL/uL   Hemoglobin 12.4 12.0 - 15.0 g/dL   HCT 52.8 41.3 - 24.4 %   MCV 96.1 80.0 - 100.0 fL   MCH 30.2 26.0 - 34.0 pg   MCHC 31.5 30.0 - 36.0 g/dL   RDW 01.0 27.2 - 53.6 %   Platelets 225 150 - 400 K/uL   nRBC 0.0 0.0 - 0.2 %    Comment: Performed at [facility], [address] [place], [place] [address]  Basic metabolic panel      Status: Abnormal   Collection Time: 06/19/23  3:01 PM  Result Value Ref Range   Sodium 139 135 - 145 mmol/L   Potassium 3.1 (L) 3.5 - 5.1 mmol/L   Chloride 98 98 - 111 mmol/L   CO2 32 22 - 32 mmol/L   Glucose, Bld 154 (H) 70 - 99 mg/dL    Comment: Glucose reference range applies only to samples taken after fasting for at least 8 hours.   BUN 11 8 - 23 mg/dL   Creatinine, Ser 4.74 0.44 - 1.00 mg/dL   Calcium 8.7 (L) 8.9 - 10.3 mg/dL   GFR, Estimated >25 >95 mL/min    Comment: (NOTE) Calculated using the CKD-EPI Creatinine Equation (2021)    Anion gap 9 5 - 15    Comment: Performed at [facility], [address], [place], [place] [address]  Troponin I (High Sensitivity)     Status: None   Collection Time: 06/19/23  3:01 PM  Result Value Ref Range   Troponin I (High Sensitivity) 8 <18 ng/L    Comment: (NOTE) Elevated high sensitivity troponin I (hsTnI) values and significant  changes across serial measurements may suggest ACS but many other  chronic and acute conditions are known to elevate hsTnI results.  Refer to the "Links" section for chest pain algorithms and additional  guidance. Performed at [facility], [address] [place], [place] [address]   Resp panel by RT-PCR (RSV, Flu A&B, Covid) Anterior Nasal Swab     Status: None   Collection Time: 06/19/23  3:01 PM   Specimen: Anterior Nasal Swab  Result Value Ref Range   SARS Coronavirus 2 by RT PCR NEGATIVE NEGATIVE    Comment: (NOTE) SARS-CoV-2 target nucleic acids are NOT DETECTED.  The SARS-CoV-2 RNA is generally detectable in upper respiratory specimens during the acute phase of infection. The lowest concentration of SARS-CoV-2 viral copies this assay can detect is 138 copies/mL. A negative result does not preclude SARS-Cov-2 infection and should not be used as the sole basis for treatment or other patient management decisions. A negative result may occur with  improper  specimen collection/handling, submission of specimen other than nasopharyngeal swab, presence of viral mutation(s) within the areas targeted by this assay, and inadequate number of viral copies(<138 copies/mL). A negative result must be combined with clinical observations, patient history, and epidemiological information. The expected result is Negative.  Fact Sheet for Patients:  BloggerCourse.com  Fact Sheet for Healthcare Providers:  SeriousBroker.it  This test is no t yet approved or cleared by the [place]  FDA and  has been authorized for detection and/or diagnosis of SARS-CoV-2 by FDA under an Emergency Use Authorization (EUA). This EUA will remain  in effect (meaning this test can be used) for the duration of the COVID-19 declaration under Section 564(b)(1) of the Act, 21 U.S.C.section 360bbb-3(b)(1), unless the authorization is terminated  or revoked sooner.       Influenza A by PCR NEGATIVE NEGATIVE   Influenza B by PCR NEGATIVE NEGATIVE    Comment: (NOTE) The Xpert Xpress SARS-CoV-2/FLU/RSV plus assay is intended as an aid in the diagnosis of influenza from Nasopharyngeal swab specimens and should not be used as a sole basis for treatment. Nasal washings and aspirates are unacceptable for Xpert Xpress SARS-CoV-2/FLU/RSV testing.  Fact Sheet for Patients: BloggerCourse.com  Fact Sheet for Healthcare Providers: SeriousBroker.it  This test is not yet approved or cleared by the [place] FDA and has been authorized for detection and/or diagnosis of SARS-CoV-2 by FDA under an Emergency Use Authorization (EUA). This EUA will remain in effect (meaning this test can be used) for the duration of the COVID-19 declaration under Section 564(b)(1) of the Act, 21 U.S.C. section 360bbb-3(b)(1), unless the authorization is terminated or revoked.     Resp Syncytial Virus  by PCR NEGATIVE NEGATIVE    Comment: (NOTE) Fact Sheet for Patients: BloggerCourse.com  Fact Sheet for Healthcare Providers: SeriousBroker.it  This test is not yet approved or cleared by the [place] FDA and has been authorized for detection and/or diagnosis of SARS-CoV-2 by FDA under an Emergency Use Authorization (EUA). This EUA will remain in effect (meaning this test can be used) for the duration of the COVID-19 declaration under Section 564(b)(1) of the Act, 21 U.S.C. section 360bbb-3(b)(1), unless the authorization is terminated or revoked.  Performed at [facility], [address], [place], [place] [address]    DG Chest Portable 1 View  Result Date: 06/19/2023 CLINICAL DATA:  Shortness of breath EXAM: PORTABLE CHEST 1 VIEW COMPARISON:  Radiograph 06/04/2023 and CT chest 06/05/2023 FINDINGS: Postsurgical changes from left upper lobectomy with left apex opacification and leftward deviation of the superior mediastinum and trachea. No focal consolidation, pleural effusion, or pneumothorax. Chronic blunting of the left costophrenic angle. Hyperinflation and chronic bronchitic change. Stable cardiomediastinal silhouette. IMPRESSION: No acute cardiopulmonary process.  Emphysema.  Left upper lobectomy. Electronically Signed   By: Minerva Fester M.D.   On: 06/19/2023 19:06    Pending Labs Unresulted Labs (From admission, onward)     Start     Ordered   06/20/23 0500  Basic metabolic panel  Tomorrow morning,   R        06/19/23 1739   06/20/23 0500  CBC  Tomorrow morning,   R        06/19/23 1739            Vitals/Pain Today's Vitals   06/19/23 2332 06/20/23 0045 06/20/23 0228 06/20/23 0228  BP:  121/76 106/73   Pulse: 74 99 79   Resp: (!) 26 (!) 25 (!) 22   Temp: 98.2 F (36.8 C)  98.1 F (36.7 C)   TempSrc: Oral  Oral   SpO2: 98% 96% 98%   Weight:      Height:      PainSc:    0-No pain     Isolation Precautions No active isolations  Medications Medications  metoprolol succinate (TOPROL-XL) 24 hr tablet 12.5 mg (0 mg Oral Hold 06/19/23 1910)  nitroGLYCERIN (NITROSTAT) SL tablet 0.4  mg (has no administration in time range)  rosuvastatin (CRESTOR) tablet 20 mg (0 mg Oral Hold 06/19/23 1911)  ALPRAZolam (XANAX) tablet 0.5 mg (has no administration in time range)  sertraline (ZOLOFT) tablet 25 mg (has no administration in time range)  levothyroxine (SYNTHROID) tablet 125 mcg (has no administration in time range)  apixaban (ELIQUIS) tablet 5 mg (5 mg Oral Given 06/19/23 2123)  magnesium oxide (MAG-OX) tablet 400 mg (has no administration in time range)  cefTRIAXone (ROCEPHIN) 1 g in sodium chloride 0.9 % 100 mL IVPB (0 g Intravenous Stopped 06/19/23 1921)  acetaminophen (TYLENOL) tablet 650 mg (has no administration in time range)    Or  acetaminophen (TYLENOL) suppository 650 mg (has no administration in time range)  traZODone (DESYREL) tablet 25 mg (has no administration in time range)  magnesium hydroxide (MILK OF MAGNESIA) suspension 30 mL (has no administration in time range)  ondansetron (ZOFRAN) tablet 4 mg (has no administration in time range)    Or  ondansetron (ZOFRAN) injection 4 mg (has no administration in time range)  polyvinyl alcohol (LIQUIFILM TEARS) 1.4 % ophthalmic solution 1 drop (has no administration in time range)  pantoprazole (PROTONIX) EC tablet 40 mg (has no administration in time range)  albuterol (PROVENTIL) (2.5 MG/3ML) 0.083% nebulizer solution (10 mg/hr Nebulization Given 06/19/23 1515)  magnesium sulfate IVPB 2 g 50 mL (0 g Intravenous Stopped 06/19/23 1557)  potassium chloride SA (KLOR-CON M) CR tablet 40 mEq (40 mEq Oral Given 06/19/23 1738)  potassium chloride (KLOR-CON) packet 40 mEq (40 mEq Oral Given 06/19/23 2124)    Mobility walks     Focused Assessments Pulmonary Assessment Handoff:  Lung sounds: Bilateral Breath Sounds:  Diminished O2 Device: Nasal Cannula O2 Flow Rate (L/min): 2 L/min    R Recommendations: See Admitting Provider Note  Report given to: Kerrin Champagne, RN  Additional Notes: A&O x4, can get up to use bedside commode, wears 2L Cutter, pt has strong congested cough. VSS, SBP can be soft when pt is asleep. NAD noted, pt very nice.

## 2023-06-20 NOTE — TOC Initial Note (Signed)
Transition of Care [facility]) - Initial/Assessment Note    Patient Details  Name: Tracy Park MRN: [id number] Date of Birth: 10/07/1955  [facility]) CM/SW Contact:    Larrie Kass, LCSW Phone Number: 06/20/2023, 3:44 PM  Clinical Narrative:                 CSW spoke with pt to discuss home health services, pt reports she will be living with her daughter when she is discharged. Pt stated she will have to ask her daughter if it is ok for her to have HH services come to the home. Pt's reports no DME needs and thinks she may need transport assistance if her daughter can not pick up upon d/c. TOC to follow.     Expected Discharge Plan:  (TBD) Barriers to Discharge: Continued Medical Work up   Patient Goals and CMS Choice Patient states their goals for this hospitalization and ongoing recovery are:: retrun to daughter's home          Expected Discharge Plan and Services       Living arrangements for the past 2 months: Single Family Home                                      Prior Living Arrangements/Services Living arrangements for the past 2 months: Single Family Home Lives with:: Self, Adult Children Patient language and need for interpreter reviewed:: Yes Do you feel safe going back to the place where you live?: Yes      Need for Family Participation in Patient Care: No (Comment) Care giver support system in place?: No (comment)   Criminal Activity/Legal Involvement Pertinent to Current Situation/Hospitalization: No - Comment as needed  Activities of Daily Living   ADL Screening (condition at time of admission) Independently performs ADLs?: Yes (appropriate for developmental age) Does the patient have a NEW difficulty with bathing/dressing/toileting/self-feeding that is expected to last >3 days?: No Does the patient have a NEW difficulty with getting in/out of bed, walking, or climbing stairs that is expected to last >3 days?: No Does the  patient have a NEW difficulty with communication that is expected to last >3 days?: No Is the patient deaf or have difficulty hearing?: No Does the patient have difficulty seeing, even when wearing glasses/contacts?: Yes Does the patient have difficulty concentrating, remembering, or making decisions?: No  Permission Sought/Granted                  Emotional Assessment Appearance:: Appears stated age Attitude/Demeanor/Rapport: Gracious, Engaged Affect (typically observed): Accepting Orientation: : Oriented to Place, Oriented to Self, Oriented to  Time, Oriented to Situation      Admission diagnosis:  Hypokalemia [E87.6] COPD exacerbation (HCC) [J44.1] Acute on chronic respiratory failure with hypoxia (HCC) [J96.21] Patient Active Problem List   Diagnosis Date Noted   Food insecurity 06/05/2023   Left foot pain 04/12/2023   Acute exacerbation of chronic obstructive pulmonary disease (COPD) (HCC) 03/06/2023   Hypocalcemia 03/06/2023   GAD (generalized anxiety disorder) 03/06/2023   Anemia of chronic disease 03/06/2023   Paroxysmal atrial fibrillation (HCC) 02/23/2023   Paroxysmal atrial fibrillation with RVR (HCC) 01/25/2023   Pulmonary nodule 01/25/2023   COPD (chronic obstructive pulmonary disease) (HCC) 01/25/2023   Essential hypertension 01/25/2023   Thrombocytosis 03/26/2022   COPD exacerbation (HCC) 03/25/2022   DDD (degenerative disc disease), cervical 03/24/2022   Bipolar 2  disorder (HCC) 03/24/2022   Septic shock (HCC) 03/14/2022   Sepsis due to pneumonia (HCC) 03/13/2022   Dyslipidemia 03/13/2022   Anxiety and depression 03/13/2022   History of lung cancer 03/13/2022   Tobacco abuse 03/13/2022   Pneumonia 02/27/2022   Adenocarcinoma of lung (HCC) 02/26/2022   Leukocytosis 02/26/2022   COPD (chronic obstructive pulmonary disease) with chronic bronchitis (HCC) 02/26/2022   Chronic hypoxic respiratory failure (HCC) 02/26/2022   Normocytic anemia 02/08/2022    Protein-calorie malnutrition, severe 08/25/2021   Influenza A with pneumonia 08/16/2021   Sepsis (HCC) 08/16/2021   Hyponatremia 08/16/2021   Hypokalemia 08/16/2021   Hypomagnesemia 08/16/2021   Hydronephrosis 05/03/2021   Acute encephalopathy 05/02/2021   Acute on chronic respiratory failure with hypoxia (HCC) 04/10/2020   CAP (community acquired pneumonia) 04/10/2020   Coronary artery disease involving native coronary artery of native heart without angina pectoris 08/18/2018   Malignant neoplasm of upper lobe of left lung (HCC) 11/12/2016   Chronic bilateral low back pain without sciatica 08/21/2016   Obstructive sleep apnea (adult) (pediatric) 02/22/2015   Hyperlipidemia 06/03/2013   Vitamin D deficiency 06/03/2013   GERD (gastroesophageal reflux disease) 04/28/2012   Acquired hypothyroidism 04/28/2012   Nicotine abuse 04/28/2012   Depression 04/28/2012   Chest pain, atypical 04/28/2012   PCP:  Jacquelin Hawking, PA-C Pharmacy:   [id number] - [place], [place] - [address] AT [facility] OF [address] [place] [id number] Phone: [phone] Fax: [phone]     Social Determinants of Health (SDOH) Social History: SDOH Screenings   Food Insecurity: Food Insecurity Present (06/20/2023)  Housing: Low Risk  (06/20/2023)  Recent Concern: Housing - Medium Risk (04/12/2023)  Transportation Needs: No Transportation Needs (06/20/2023)  Recent Concern: Transportation Needs - Unmet Transportation Needs (04/12/2023)  Utilities: Not At Risk (06/20/2023)  Financial Resource Strain: Medium Risk (04/07/2023)   Received from [facility]  Physical Activity: Unknown (04/07/2023)   Received from [facility]  Social Connections: Moderately Integrated (04/07/2023)   Received from [facility]  Stress: Stress Concern Present (04/07/2023)   Received from [facility]  Tobacco Use: High Risk (06/19/2023)   SDOH Interventions:      Readmission Risk Interventions    04/29/2023    2:02 PM 04/17/2023    4:34 PM 04/13/2023    6:12 PM  Readmission Risk Prevention Plan  Transportation Screening Complete Complete Complete  Medication Review Oceanographer) Complete Complete Complete  PCP or Specialist appointment within 3-5 days of discharge Complete Complete Complete  HRI or Home Care Consult Complete Complete Complete  SW Recovery Care/Counseling Consult Complete Complete Complete  Palliative Care Screening Not Applicable Not Applicable Not Applicable  Skilled Nursing Facility Not Applicable Not Applicable Not Applicable

## 2023-06-20 NOTE — Hospital Course (Addendum)
Tracy Park is a 67 y.o. female with a history of bipolar disorder, anxiety, COPD, paroxysmal atrial fibrillation, PTSD, MI, OSA and lung cancer s/p left upper lobectomy.  Patient presented secondary to shortness of breath and was found to have a COPD exacerbation with associated acute on chronic hypoxia. Patient started on steroids and bronchodilator therapy with improvement of functional capacity. Home health recommended and provided prior to discharge.

## 2023-06-20 NOTE — Plan of Care (Signed)
  Problem: Activity: Goal: Ability to tolerate increased activity will improve Outcome: Progressing   Problem: Respiratory: Goal: Ability to maintain a clear airway will improve Outcome: Progressing Goal: Levels of oxygenation will improve Outcome: Progressing Goal: Ability to maintain adequate ventilation will improve Outcome: Progressing   

## 2023-06-21 DIAGNOSIS — J441 Chronic obstructive pulmonary disease with (acute) exacerbation: Secondary | ICD-10-CM | POA: Diagnosis not present

## 2023-06-21 MED ORDER — BENZONATATE 100 MG PO CAPS
100.0000 mg | ORAL_CAPSULE | Freq: Once | ORAL | Status: AC
  Administered 2023-06-21: 100 mg via ORAL
  Filled 2023-06-21: qty 1

## 2023-06-21 MED ORDER — BUTALBITAL-APAP-CAFFEINE 50-325-40 MG PO TABS
1.0000 | ORAL_TABLET | Freq: Once | ORAL | Status: AC
  Administered 2023-06-21: 1 via ORAL
  Filled 2023-06-21: qty 1

## 2023-06-21 NOTE — Progress Notes (Signed)
Mobility Specialist - Progress Note  Pre-mobility: 95 bpm HR, 94% SpO2 During mobility: 110 bpm HR, 91% SpO2 Post-mobility: 100 bpm HR, 90% SPO2   06/21/23 1035  Oxygen Therapy  O2 Device Nasal Cannula  O2 Flow Rate (L/min) 2 L/min  Patient Activity (if Appropriate) Ambulating  Mobility  Activity Ambulated independently in hallway  Level of Assistance Standby assist, set-up cues, supervision of patient - no hands on  Assistive Device None  Distance Ambulated (ft) 110 ft  Range of Motion/Exercises Active  Activity Response Tolerated fair  Mobility Referral Yes  $Mobility charge 1 Mobility  Mobility Specialist Start Time (ACUTE ONLY) 1020  Mobility Specialist Stop Time (ACUTE ONLY) 1035  Mobility Specialist Time Calculation (min) (ACUTE ONLY) 15 min   Pt was found in bed and agreeable to ambulate. Pt having productive cough with session. Had x1 standing rest break due to pt feeling tight chested and SOB. Pt returned to use [facility] and perform ADLs. Pt returned to bed with all needs met. Call bell in reach and bed alarm on.  Billey Chang Mobility Specialist

## 2023-06-21 NOTE — Plan of Care (Signed)
Problem: Education: Goal: Knowledge of disease or condition will improve Outcome: Progressing Goal: Knowledge of the prescribed therapeutic regimen will improve Outcome: Progressing Goal: Individualized Educational Video(s) Outcome: Progressing   Problem: Activity: Goal: Ability to tolerate increased activity will improve Outcome: Progressing

## 2023-06-21 NOTE — Progress Notes (Signed)
PROGRESS NOTE    SHARILEE LOUPE  KGM:[id number] DOB: 03/25/1956 DOA: 06/19/2023 PCP: Jacquelin Hawking, PA-C   Brief Narrative: Tracy Park is a 67 y.o. female with a history of bipolar disorder, anxiety, COPD, paroxysmal atrial fibrillation, PTSD, MI, OSA and lung cancer s/p left upper lobectomy.  Patient presented secondary to shortness of breath and was found to have a COPD exacerbation with associated acute on chronic hypoxia. Patient started on steroids and bronchodilator therapy.   Assessment and Plan:  COPD exacerbation -Continue Solu-medrol, Duonebs -Sputum culture  Acute on chronic respiratory failure with hypoxia Patient is on 2 L/min of supplemental oxygen at baseline. Acute hypoxia secondary to COPD exacerbation. Patient requiring up to 6 L/min on admission, weaned down to 2 L/min -Continue supplemental oxygen at 2 L/min -Ambulatory pulse ox prior to discharge  Paroxysmal atrial fibrillation -Continue Eliqus and Toprol XL  Hyperlipidemia -Continue Crestor  GERD -Continue Protonix  Depression -Continue Zoloft   DVT prophylaxis: Eliquis Code Status:   Code Status: Do not attempt resuscitation (DNR) PRE-ARREST INTERVENTIONS DESIRED Family Communication: None at bedside Disposition Plan: Discharge home likely in 24 hours if functional capacity improves   Consultants:  None  Procedures:  None  Antimicrobials: Ceftriaxone    Subjective: Continued improvement but patient states she has poor functional capacity and is unable to perform her ADLs at her current performance level.  Objective: BP 118/81 (BP Location: Right Arm)   Pulse 66   Temp 98 F (36.7 C) (Oral)   Resp 18   Ht 5\' 3"  (1.6 m)   Wt 45.4 kg   SpO2 99%   BMI 17.71 kg/m   Examination:  General exam: Appears calm and comfortable Respiratory system: Wheezing bilaterally. Moderate air movement. Respiratory effort normal. Cardiovascular system: S1 & S2 heard, RRR. No murmurs,  rubs, gallops or clicks. Gastrointestinal system: Abdomen is nondistended, soft and nontender. Normal bowel sounds heard. Central nervous system: Alert and oriented. No focal neurological deficits. Musculoskeletal: No edema. No calf tenderness Psychiatry: Judgement and insight appear normal. Mood & affect appropriate.    Data Reviewed: I have personally reviewed following labs and imaging studies  CBC Lab Results  Component Value Date   WBC 12.6 (H) 06/20/2023   RBC 4.04 06/20/2023   HGB 11.9 (L) 06/20/2023   HCT 39.5 06/20/2023   MCV 97.8 06/20/2023   MCH 29.5 06/20/2023   PLT 222 06/20/2023   MCHC 30.1 06/20/2023   RDW 14.6 06/20/2023   LYMPHSABS 2.4 06/06/2023   MONOABS 1.0 06/06/2023   EOSABS 0.0 06/06/2023   BASOSABS 0.0 06/06/2023     Last metabolic panel Lab Results  Component Value Date   NA 137 06/20/2023   K 4.7 06/20/2023   CL 98 06/20/2023   CO2 30 06/20/2023   BUN 19 06/20/2023   CREATININE 0.51 06/20/2023   GLUCOSE 137 (H) 06/20/2023   GFRNONAA >60 06/20/2023   GFRAA >60 04/12/2020   CALCIUM 8.8 (L) 06/20/2023   PHOS 3.7 03/09/2023   PROT 5.8 (L) 04/12/2023   ALBUMIN 3.0 (L) 04/12/2023   BILITOT 0.3 04/12/2023   ALKPHOS 53 04/12/2023   AST 14 (L) 04/12/2023   ALT 18 04/12/2023   ANIONGAP 9 06/20/2023    GFR: Estimated Creatinine Clearance: 50.2 mL/min (by C-G formula based on SCr of 0.51 mg/dL).  Recent Results (from the past 240 hour(s))  Resp panel by RT-PCR (RSV, Flu A&B, Covid) Anterior Nasal Swab     Status: None   Collection  Time: 06/19/23  3:01 PM   Specimen: Anterior Nasal Swab  Result Value Ref Range Status   SARS Coronavirus 2 by RT PCR NEGATIVE NEGATIVE Final    Comment: (NOTE) SARS-CoV-2 target nucleic acids are NOT DETECTED.  The SARS-CoV-2 RNA is generally detectable in upper respiratory specimens during the acute phase of infection. The lowest concentration of SARS-CoV-2 viral copies this assay can detect is 138 copies/mL.  A negative result does not preclude SARS-Cov-2 infection and should not be used as the sole basis for treatment or other patient management decisions. A negative result may occur with  improper specimen collection/handling, submission of specimen other than nasopharyngeal swab, presence of viral mutation(s) within the areas targeted by this assay, and inadequate number of viral copies(<138 copies/mL). A negative result must be combined with clinical observations, patient history, and epidemiological information. The expected result is Negative.  Fact Sheet for Patients:  BloggerCourse.com  Fact Sheet for Healthcare Providers:  SeriousBroker.it  This test is no t yet approved or cleared by the [place] FDA and  has been authorized for detection and/or diagnosis of SARS-CoV-2 by FDA under an Emergency Use Authorization (EUA). This EUA will remain  in effect (meaning this test can be used) for the duration of the COVID-19 declaration under Section 564(b)(1) of the Act, 21 U.S.C.section 360bbb-3(b)(1), unless the authorization is terminated  or revoked sooner.       Influenza A by PCR NEGATIVE NEGATIVE Final   Influenza B by PCR NEGATIVE NEGATIVE Final    Comment: (NOTE) The Xpert Xpress SARS-CoV-2/FLU/RSV plus assay is intended as an aid in the diagnosis of influenza from Nasopharyngeal swab specimens and should not be used as a sole basis for treatment. Nasal washings and aspirates are unacceptable for Xpert Xpress SARS-CoV-2/FLU/RSV testing.  Fact Sheet for Patients: BloggerCourse.com  Fact Sheet for Healthcare Providers: SeriousBroker.it  This test is not yet approved or cleared by the [place] FDA and has been authorized for detection and/or diagnosis of SARS-CoV-2 by FDA under an Emergency Use Authorization (EUA). This EUA will remain in effect (meaning this test  can be used) for the duration of the COVID-19 declaration under Section 564(b)(1) of the Act, 21 U.S.C. section 360bbb-3(b)(1), unless the authorization is terminated or revoked.     Resp Syncytial Virus by PCR NEGATIVE NEGATIVE Final    Comment: (NOTE) Fact Sheet for Patients: BloggerCourse.com  Fact Sheet for Healthcare Providers: SeriousBroker.it  This test is not yet approved or cleared by the [place] FDA and has been authorized for detection and/or diagnosis of SARS-CoV-2 by FDA under an Emergency Use Authorization (EUA). This EUA will remain in effect (meaning this test can be used) for the duration of the COVID-19 declaration under Section 564(b)(1) of the Act, 21 U.S.C. section 360bbb-3(b)(1), unless the authorization is terminated or revoked.  Performed at [facility], [address], [place], [place] [address]   Expectorated Sputum Assessment w Gram Stain, Rflx to Resp Cult     Status: None   Collection Time: 06/20/23 12:28 PM   Specimen: Expectorated Sputum  Result Value Ref Range Status   Specimen Description EXPECTORATED SPUTUM  Final   Special Requests NONE  Final   Sputum evaluation   Final    THIS SPECIMEN IS ACCEPTABLE FOR SPUTUM CULTURE Performed at [facility], [address], [place], [place] [address]    Report Status 06/20/2023 FINAL  Final      Radiology Studies: DG Chest Portable 1  View  Result Date: 06/19/2023 CLINICAL DATA:  Shortness of breath EXAM: PORTABLE CHEST 1 VIEW COMPARISON:  Radiograph 06/04/2023 and CT chest 06/05/2023 FINDINGS: Postsurgical changes from left upper lobectomy with left apex opacification and leftward deviation of the superior mediastinum and trachea. No focal consolidation, pleural effusion, or pneumothorax. Chronic blunting of the left costophrenic angle. Hyperinflation and chronic bronchitic change. Stable cardiomediastinal  silhouette. IMPRESSION: No acute cardiopulmonary process.  Emphysema.  Left upper lobectomy. Electronically Signed   By: Minerva Fester M.D.   On: 06/19/2023 19:06      LOS: 2 days    Jacquelin Hawking, MD [facility] 06/21/2023, 8:37 AM   If 7PM-7AM, please contact night-coverage [email or web address]

## 2023-06-22 DIAGNOSIS — J441 Chronic obstructive pulmonary disease with (acute) exacerbation: Secondary | ICD-10-CM | POA: Diagnosis not present

## 2023-06-22 MED ORDER — ALBUTEROL SULFATE (2.5 MG/3ML) 0.083% IN NEBU: INHALATION_SOLUTION | RESPIRATORY_TRACT | 0 refills | Status: DC

## 2023-06-22 MED ORDER — PREDNISONE 10 MG PO TABS: 40.0000 mg | ORAL_TABLET | Freq: Every day | ORAL | 0 refills | Status: DC

## 2023-06-22 MED ORDER — METOPROLOL SUCCINATE ER 25 MG PO TB24: 12.5000 mg | ORAL_TABLET | Freq: Every day | ORAL | 0 refills | Status: DC

## 2023-06-22 MED ORDER — BUTALBITAL-APAP-CAFFEINE 50-325-40 MG PO TABS
1.0000 | ORAL_TABLET | Freq: Once | ORAL | Status: AC
  Administered 2023-06-22: 1 via ORAL
  Filled 2023-06-22: qty 1

## 2023-06-22 MED ORDER — GUAIFENESIN ER 600 MG PO TB12: 1200.0000 mg | ORAL_TABLET | Freq: Two times a day (BID) | ORAL | 0 refills | Status: DC

## 2023-06-22 MED ORDER — IPRATROPIUM-ALBUTEROL 0.5-2.5 (3) MG/3ML IN SOLN: 3.0000 mL | Freq: Two times a day (BID) | RESPIRATORY_TRACT | Status: DC

## 2023-06-22 MED ORDER — DOXYCYCLINE HYCLATE 100 MG PO CAPS: 100.0000 mg | ORAL_CAPSULE | Freq: Two times a day (BID) | ORAL | 0 refills | Status: DC

## 2023-06-22 NOTE — TOC Transition Note (Addendum)
Transition of Care [facility]) - CM/SW Discharge Note   Patient Details  Name: Tracy Park MRN: [id number] Date of Birth: 09/16/1955  [facility]) CM/SW Contact:  Princella Ion, LCSW Phone Number: 06/22/2023, 1:59 PM   Clinical Narrative:    Pt has attempted to contact daughter regarding HH coming into daughters home. She reports she keeps getting voicemail. Pt stated she has a missed call and voicemail from daughter that states if she is released at 3pm or later to come directly to her house and she (daughter) will be there.   Pt will receive a cab voucher for transport to her daughter's house. Pt is on 2L [place] at baseline and reports she has her oxygen with her and is serviced through [employer]. She reported she is in the process of transitioning to another agency. Pt has not been able to discuss HH with her daughter at this time. CSW also has attempted to contact daughter again without success.   [address] [place], [place] [address]      Barriers to Discharge: Continued Medical Work up   Patient Goals and CMS Choice      Discharge Placement                         Discharge Plan and Services Additional resources added to the After Visit Summary for                                       Social Determinants of Health (SDOH) Interventions SDOH Screenings   Food Insecurity: Food Insecurity Present (06/20/2023)  Housing: Low Risk  (06/20/2023)  Recent Concern: Housing - Medium Risk (04/12/2023)  Transportation Needs: No Transportation Needs (06/20/2023)  Recent Concern: Transportation Needs - Unmet Transportation Needs (04/12/2023)  Utilities: Not At Risk (06/20/2023)  Financial Resource Strain: Medium Risk (04/07/2023)   Received from [facility]  Physical Activity: Unknown (04/07/2023)   Received from [facility]  Social Connections: Moderately Integrated (04/07/2023)   Received from [facility]  Stress: Stress Concern Present (04/07/2023)    Received from [facility]  Tobacco Use: High Risk (06/19/2023)   Received from [facility]     Readmission Risk Interventions    04/29/2023    2:02 PM 04/17/2023    4:34 PM 04/13/2023    6:12 PM  Readmission Risk Prevention Plan  Transportation Screening Complete Complete Complete  Medication Review Oceanographer) Complete Complete Complete  PCP or Specialist appointment within 3-5 days of discharge Complete Complete Complete  HRI or Home Care Consult Complete Complete Complete  SW Recovery Care/Counseling Consult Complete Complete Complete  Palliative Care Screening Not Applicable Not Applicable Not Applicable  Skilled Nursing Facility Not Applicable Not Applicable Not Applicable

## 2023-06-22 NOTE — Discharge Instructions (Signed)
Tracy Park,  You were in the hospital with a COPD exacerbation which has improved with steroids and breathing treatments. You have also received antibiotics with some improvement. Please continue to use your medications as prescribed. You really need to follow-up with a pulmonologist (lung doctor) so please do this as quickly as you can  to help prevent future admissions to the hospital. I think a rolling walker, especially with a seat, would be helpful for you, but you have declined this; please discuss with your PCP if you change your mind in the future.

## 2023-06-22 NOTE — TOC Progression Note (Addendum)
Transition of Care [facility]) - Progression Note    Patient Details  Name: Tracy Park MRN: [id number] Date of Birth: November 28, 1955  [facility] CM/SW Contact  Princella Ion, LCSW Phone Number: 06/22/2023, 10:38 AM  Clinical Narrative:    CSW attempted to contact pt and pt's daughter to discuss interest with [facility] services. CSW left HIPAA Compliant voicemails with both pt and daughter. Will try back. CSW outreached to RN for assistance.   Addend @ 1:23 PM CSW has attempted to contact pt several times without success. RN reported he's advised pt to call this CSW back. RN reports he will reiterate need for pt to return call/answer to coordinate [facility] and transportation. CSW noted pt's discharge summary is in. RN reported pt is ordering her lunch at this time.   Expected Discharge Plan:  (TBD) Barriers to Discharge: Continued Medical Work up  Expected Discharge Plan and Services       Living arrangements for the past 2 months: Single Family Home                                       Social Determinants of Health (SDOH) Interventions SDOH Screenings   Food Insecurity: Food Insecurity Present (06/20/2023)  Housing: Low Risk  (06/20/2023)  Recent Concern: Housing - Medium Risk (04/12/2023)  Transportation Needs: No Transportation Needs (06/20/2023)  Recent Concern: Transportation Needs - Unmet Transportation Needs (04/12/2023)  Utilities: Not At Risk (06/20/2023)  Financial Resource Strain: Medium Risk (04/07/2023)   Received from [facility]  Physical Activity: Unknown (04/07/2023)   Received from [facility]  Social Connections: Moderately Integrated (04/07/2023)   Received from [facility]  Stress: Stress Concern Present (04/07/2023)   Received from [facility]  Tobacco Use: High Risk (06/19/2023)   Received from [facility]    Readmission Risk Interventions    04/29/2023    2:02 PM 04/17/2023    4:34 PM 04/13/2023    6:12 PM  Readmission Risk  Prevention Plan  Transportation Screening Complete Complete Complete  Medication Review Oceanographer) Complete Complete Complete  PCP or Specialist appointment within 3-5 days of discharge Complete Complete Complete  HRI or Home Care Consult Complete Complete Complete  SW Recovery Care/Counseling Consult Complete Complete Complete  Palliative Care Screening Not Applicable Not Applicable Not Applicable  Skilled Nursing Facility Not Applicable Not Applicable Not Applicable

## 2023-06-22 NOTE — Plan of Care (Signed)
Patient declines being treated further at Inpatient Rehab/SNF, being discharged to home.

## 2023-06-22 NOTE — Discharge Summary (Signed)
Physician Discharge Summary   Patient: LETASHA Park MRN: [id number] DOB: 1956-04-09  Admit date:     06/19/2023  Discharge date: 06/22/23  Discharge Physician: Jacquelin Hawking, MD   PCP: Jacquelin Hawking, PA-C   Recommendations at discharge:  PCP follow-up Pulmonology follow-up  Discharge Diagnoses: Principal Problem:   COPD exacerbation [facility]) Active Problems:   Acute on chronic respiratory failure with hypoxia (HCC)   Depression   Dyslipidemia   Paroxysmal atrial fibrillation (HCC)  Resolved Problems:   * No resolved hospital problems. *  Hospital Course: Tracy Park is a 67 y.o. female with a history of bipolar disorder, anxiety, COPD, paroxysmal atrial fibrillation, PTSD, MI, OSA and lung cancer s/p left upper lobectomy.  Patient presented secondary to shortness of breath and was found to have a COPD exacerbation with associated acute on chronic hypoxia. Patient started on steroids and bronchodilator therapy with improvement of functional capacity. Home health recommended and provided prior to discharge.  Assessment and Plan:  COPD exacerbation Patient started on management with Solu-medrol, Duonebs and Ceftriaxone. Patient with steady improvement in symptoms. Sputum culture obtained but pending on day of discharge. Patient to continue albuterol every 4 hours while wake for the next 3 days in addition to completing her steroid course with prednisone. Doxycycline provided on discharge. Recommendation given to patient to follow-up with her pulmonologist.   Acute on chronic respiratory failure with hypoxia Patient is on 2 L/min of supplemental oxygen at baseline. Acute hypoxia secondary to COPD exacerbation. Patient requiring up to 6 L/min on admission, weaned down to 2 L/min. Physical therapy recommending home health; physical therapy also recommended a rolling walker, which the patient declined.   Paroxysmal atrial fibrillation Continue Eliqus and Toprol XL    Hyperlipidemia Continue Crestor   GERD Continue Protonix   Depression Continue Zoloft   Consultants: None Procedures performed: None  Disposition: Home health Diet recommendation: Regular diet   DISCHARGE MEDICATION: Allergies as of 06/22/2023       Reactions   Red Dye #40 (allura Red) Hives, Itching, Other (See Comments)   Red food dye   Strawberry Extract Hives, Itching   Tomato Hives, Itching   Oxycontin [oxycodone Hcl] Other (See Comments)   Hallucinations    Aspirin Hives   Tape Rash, Other (See Comments)   Prefers paper tape   Wound Dressing Adhesive Rash        Medication List     TAKE these medications    albuterol 108 (90 Base) MCG/ACT inhaler Commonly known as: VENTOLIN HFA Inhale 2 puffs into the lungs every 6 (six) hours as needed for wheezing or shortness of breath. What changed: when to take this   albuterol (2.5 MG/3ML) 0.083% nebulizer solution Commonly known as: PROVENTIL Take 3 mLs (2.5 mg total) by nebulization every 4 (four) hours while awake for 3 days, THEN 3 mLs (2.5 mg total) every 4 (four) hours as needed for wheezing or shortness of breath. Start taking on: June 22, 2023 What changed: See the new instructions.   ALPRAZolam 0.5 MG tablet Commonly known as: XANAX Take 0.5 mg by mouth in the morning and at bedtime.   Breztri Aerosphere 160-9-4.8 MCG/ACT Aero Generic drug: Budeson-Glycopyrrol-Formoterol Inhale 1 puff into the lungs in the morning and at bedtime.   doxycycline 100 MG capsule Commonly known as: VIBRAMYCIN Take 1 capsule (100 mg total) by mouth 2 (two) times daily for 5 days.   Eliquis 5 MG Tabs tablet Generic drug: apixaban Take 1 tablet (  5 mg total) by mouth 2 (two) times daily.   guaiFENesin 600 MG 12 hr tablet Commonly known as: MUCINEX Take 2 tablets (1,200 mg total) by mouth 2 (two) times daily for 3 days.   levothyroxine 125 MCG tablet Commonly known as: SYNTHROID Take 125 mcg by mouth daily before  breakfast.   losartan 25 MG tablet Commonly known as: COZAAR Take 12.5 mg by mouth daily.   magnesium oxide 400 (240 Mg) MG tablet Commonly known as: MAG-OX Take 400 mg by mouth in the morning.   metoprolol succinate 25 MG 24 hr tablet Commonly known as: TOPROL-XL Take 12.5 mg by mouth daily.   nitroGLYCERIN 0.4 MG SL tablet Commonly known as: NITROSTAT Place 0.4 mg under the tongue every 5 (five) minutes as needed for chest pain.   OXYGEN Inhale 2 L/min into the lungs continuous.   predniSONE 10 MG tablet Commonly known as: DELTASONE Take 4 tablets (40 mg total) by mouth daily with breakfast for 3 days.   rosuvastatin 20 MG tablet Commonly known as: CRESTOR Take 20 mg by mouth daily.   sertraline 25 MG tablet Commonly known as: ZOLOFT Take 25 mg by mouth in the morning.   Systane Complete PF 0.6 % Soln Generic drug: Propylene Glycol (PF) Place 1 drop into both eyes 3 (three) times daily as needed (for dryness).   Tylenol 8 Hour 650 MG CR tablet Generic drug: acetaminophen Take 650-1,300 mg by mouth 2 (two) times daily as needed for pain.        Follow-up Information     Jacquelin Hawking, PA-C. Schedule an appointment as soon as possible for a visit in 1 week(s).   Specialty: Physician Assistant Contact information: [address] [place] [address] [phone]                Discharge Exam: BP 107/72 (BP Location: Left Arm)   Pulse 68   Temp 98.6 F (37 C) (Oral)   Resp 18   Ht 5\' 3"  (1.6 m)   Wt 45.4 kg   SpO2 99%   BMI 17.71 kg/m   General exam: Appears calm and comfortable Respiratory system: Diffuse wheezing. Respiratory effort normal. Cardiovascular system: S1 & S2 heard, RRR. Gastrointestinal system: Abdomen is nondistended, soft and nontender. Normal bowel sounds heard. Central nervous system: Alert and oriented. No focal neurological deficits. Musculoskeletal: No edema. No calf tenderness Psychiatry: Judgement  and insight appear normal. Mood & affect appropriate.   Condition at discharge: stable  The results of significant diagnostics from this hospitalization (including imaging, microbiology, ancillary and laboratory) are listed below for reference.   Imaging Studies: DG Chest Portable 1 View  Result Date: 06/19/2023 CLINICAL DATA:  Shortness of breath EXAM: PORTABLE CHEST 1 VIEW COMPARISON:  Radiograph 06/04/2023 and CT chest 06/05/2023 FINDINGS: Postsurgical changes from left upper lobectomy with left apex opacification and leftward deviation of the superior mediastinum and trachea. No focal consolidation, pleural effusion, or pneumothorax. Chronic blunting of the left costophrenic angle. Hyperinflation and chronic bronchitic change. Stable cardiomediastinal silhouette. IMPRESSION: No acute cardiopulmonary process.  Emphysema.  Left upper lobectomy. Electronically Signed   By: Minerva Fester M.D.   On: 06/19/2023 19:06   CT CHEST WO CONTRAST  Result Date: 06/05/2023 CLINICAL DATA:  Lung nodule. EXAM: CT CHEST WITHOUT CONTRAST TECHNIQUE: Multidetector CT imaging of the chest was performed following the standard protocol without IV contrast. RADIATION DOSE REDUCTION: This exam was performed according to the departmental dose-optimization program which includes  automated exposure control, adjustment of the mA and/or kV according to patient size and/or use of iterative reconstruction technique. COMPARISON:  03/08/2023. FINDINGS: Cardiovascular: The heart is normal in size and there is a small pericardial effusion. Multi-vessel coronary artery calcifications are noted. There is atherosclerotic calcification of the aorta without evidence of aneurysm. The pulmonary trunk is normal in caliber. Mediastinum/Nodes: There is reduced lung volume on the left with mediastinal shift to the left. No mediastinal or axillary lymphadenopathy by size criteria. Evaluation of the hila is limited due to lack of IV contrast. The  trachea and esophagus are within normal limits. Lungs/Pleura: Centrilobular and paraseptal emphysematous changes are present in the lungs. The patient is status post left upper lobectomy with stable parenchymal scarring in the right upper lobe bronchiectasis and bronchial wall thickening is noted on the left. No effusion or pneumothorax. Stable opacities are noted in the posterior aspect of the right upper lobe. The previously described 1.1 cm left lower lobe nodule is no longer seen. A stable 4 mm nodule is present in the right upper lobe, axial image 63 and is unchanged from 2021 and likely benign. No new nodule is seen. Upper Abdomen: No acute abnormality. Musculoskeletal: Cervical spinal fusion hardware is noted. No acute or suspicious osseous abnormality. IMPRESSION: 1. Interval resolution of left lower lobe pulmonary nodule. No new nodule is seen. 2. Stable postsurgical changes of left upper lobectomy. 3. Emphysema. 4. Multi-vessel coronary artery calcifications. 5. Aortic atherosclerosis. Electronically Signed   By: Thornell Sartorius M.D.   On: 06/05/2023 21:53   DG Chest 2 View  Result Date: 06/04/2023 CLINICAL DATA:  COPD. Shortness of breath. Chest tightness. Cough. EXAM: CHEST - 2 VIEW COMPARISON:  04/28/2023. FINDINGS: Redemonstration of postsurgical changes from left upper lobectomy characterized by left lung apex opacification with associated linear areas of fibrosis/scarring and upward pulling of the left hilum, essentially unchanged since the prior study. Bilateral lungs are otherwise grossly clear. No acute consolidation or major lung collapse. Bilateral costophrenic angles are clear. Stable cardio-mediastinal silhouette. No acute osseous abnormalities. Lower cervical spinal fixation hardware is again seen. The soft tissues are within normal limits. IMPRESSION: *No acute cardiopulmonary process. Chronic changes of left upper lobectomy. Electronically Signed   By: Jules Schick M.D.   On: 06/04/2023  15:29    Microbiology: Results for orders placed or performed during the hospital encounter of 06/19/23  Resp panel by RT-PCR (RSV, Flu A&B, Covid) Anterior Nasal Swab     Status: None   Collection Time: 06/19/23  3:01 PM   Specimen: Anterior Nasal Swab  Result Value Ref Range Status   SARS Coronavirus 2 by RT PCR NEGATIVE NEGATIVE Final    Comment: (NOTE) SARS-CoV-2 target nucleic acids are NOT DETECTED.  The SARS-CoV-2 RNA is generally detectable in upper respiratory specimens during the acute phase of infection. The lowest concentration of SARS-CoV-2 viral copies this assay can detect is 138 copies/mL. A negative result does not preclude SARS-Cov-2 infection and should not be used as the sole basis for treatment or other patient management decisions. A negative result may occur with  improper specimen collection/handling, submission of specimen other than nasopharyngeal swab, presence of viral mutation(s) within the areas targeted by this assay, and inadequate number of viral copies(<138 copies/mL). A negative result must be combined with clinical observations, patient history, and epidemiological information. The expected result is Negative.  Fact Sheet for Patients:  BloggerCourse.com  Fact Sheet for Healthcare Providers:  SeriousBroker.it  This test is no  t yet approved or cleared by the [place] and  has been authorized for detection and/or diagnosis of SARS-CoV-2 by FDA under an Emergency Use Authorization (EUA). This EUA will remain  in effect (meaning this test can be used) for the duration of the COVID-19 declaration under Section 564(b)(1) of the Act, 21 U.S.C.section 360bbb-3(b)(1), unless the authorization is terminated  or revoked sooner.       Influenza A by PCR NEGATIVE NEGATIVE Final   Influenza B by PCR NEGATIVE NEGATIVE Final    Comment: (NOTE) The Xpert Xpress SARS-CoV-2/FLU/RSV plus assay is  intended as an aid in the diagnosis of influenza from Nasopharyngeal swab specimens and should not be used as a sole basis for treatment. Nasal washings and aspirates are unacceptable for Xpert Xpress SARS-CoV-2/FLU/RSV testing.  Fact Sheet for Patients: BloggerCourse.com  Fact Sheet for Healthcare Providers: SeriousBroker.it  This test is not yet approved or cleared by the [place] FDA and has been authorized for detection and/or diagnosis of SARS-CoV-2 by FDA under an Emergency Use Authorization (EUA). This EUA will remain in effect (meaning this test can be used) for the duration of the COVID-19 declaration under Section 564(b)(1) of the Act, 21 U.S.C. section 360bbb-3(b)(1), unless the authorization is terminated or revoked.     Resp Syncytial Virus by PCR NEGATIVE NEGATIVE Final    Comment: (NOTE) Fact Sheet for Patients: BloggerCourse.com  Fact Sheet for Healthcare Providers: SeriousBroker.it  This test is not yet approved or cleared by the [place] FDA and has been authorized for detection and/or diagnosis of SARS-CoV-2 by FDA under an Emergency Use Authorization (EUA). This EUA will remain in effect (meaning this test can be used) for the duration of the COVID-19 declaration under Section 564(b)(1) of the Act, 21 U.S.C. section 360bbb-3(b)(1), unless the authorization is terminated or revoked.  Performed at [facility], [address], [place], [place] [address]   Expectorated Sputum Assessment w Gram Stain, Rflx to Resp Cult     Status: None   Collection Time: 06/20/23 12:28 PM   Specimen: Expectorated Sputum  Result Value Ref Range Status   Specimen Description EXPECTORATED SPUTUM  Final   Special Requests NONE  Final   Sputum evaluation   Final    THIS SPECIMEN IS ACCEPTABLE FOR SPUTUM CULTURE Performed at [facility], [address], [place], [place] [address]    Report Status 06/20/2023 FINAL  Final    Labs: CBC: Recent Labs  Lab 06/19/23 1501 06/20/23 0339  WBC 18.4* 12.6*  HGB 12.4 11.9*  HCT 39.4 39.5  MCV 96.1 97.8  PLT 225 222   Basic Metabolic Panel: Recent Labs  Lab 06/19/23 1501 06/20/23 0339  NA 139 137  K 3.1* 4.7  CL 98 98  CO2 32 30  GLUCOSE 154* 137*  BUN 11 19  CREATININE 0.56 0.51  CALCIUM 8.7* 8.8*    Discharge time spent: 35 minutes.  Signed: Jacquelin Hawking, MD [facility] 06/22/2023

## 2023-06-24 ENCOUNTER — Emergency Department (HOSPITAL_COMMUNITY): Payer: 59

## 2023-06-24 ENCOUNTER — Observation Stay (HOSPITAL_COMMUNITY)
Admission: EM | Admit: 2023-06-24 | Discharge: 2023-06-27 | Disposition: A | Discharge status: Home / Self Care | Payer: 59 | Attending: Family Medicine | Admitting: Family Medicine

## 2023-06-24 DIAGNOSIS — I10 Essential (primary) hypertension: Secondary | ICD-10-CM | POA: Diagnosis present

## 2023-06-24 DIAGNOSIS — Z85118 Personal history of other malignant neoplasm of bronchus and lung: Secondary | ICD-10-CM

## 2023-06-24 DIAGNOSIS — R0781 Pleurodynia: Secondary | ICD-10-CM

## 2023-06-24 DIAGNOSIS — N200 Calculus of kidney: Secondary | ICD-10-CM | POA: Insufficient documentation

## 2023-06-24 DIAGNOSIS — E785 Hyperlipidemia, unspecified: Secondary | ICD-10-CM | POA: Diagnosis present

## 2023-06-24 DIAGNOSIS — F17210 Nicotine dependence, cigarettes, uncomplicated: Secondary | ICD-10-CM | POA: Diagnosis not present

## 2023-06-24 DIAGNOSIS — J9611 Chronic respiratory failure with hypoxia: Secondary | ICD-10-CM | POA: Diagnosis present

## 2023-06-24 DIAGNOSIS — J449 Chronic obstructive pulmonary disease, unspecified: Secondary | ICD-10-CM | POA: Diagnosis not present

## 2023-06-24 DIAGNOSIS — Z1152 Encounter for screening for COVID-19: Secondary | ICD-10-CM | POA: Insufficient documentation

## 2023-06-24 DIAGNOSIS — J441 Chronic obstructive pulmonary disease with (acute) exacerbation: Secondary | ICD-10-CM | POA: Diagnosis not present

## 2023-06-24 DIAGNOSIS — E034 Atrophy of thyroid (acquired): Secondary | ICD-10-CM | POA: Diagnosis not present

## 2023-06-24 DIAGNOSIS — J9621 Acute and chronic respiratory failure with hypoxia: Secondary | ICD-10-CM | POA: Diagnosis present

## 2023-06-24 DIAGNOSIS — R0602 Shortness of breath: Secondary | ICD-10-CM | POA: Diagnosis present

## 2023-06-24 DIAGNOSIS — F419 Anxiety disorder, unspecified: Secondary | ICD-10-CM | POA: Insufficient documentation

## 2023-06-24 DIAGNOSIS — E039 Hypothyroidism, unspecified: Secondary | ICD-10-CM | POA: Diagnosis present

## 2023-06-24 DIAGNOSIS — I480 Paroxysmal atrial fibrillation: Secondary | ICD-10-CM | POA: Diagnosis present

## 2023-06-24 DIAGNOSIS — N210 Calculus in bladder: Secondary | ICD-10-CM | POA: Insufficient documentation

## 2023-06-24 DIAGNOSIS — F32A Depression, unspecified: Secondary | ICD-10-CM | POA: Diagnosis present

## 2023-06-24 DIAGNOSIS — F329 Major depressive disorder, single episode, unspecified: Secondary | ICD-10-CM | POA: Insufficient documentation

## 2023-06-24 DIAGNOSIS — N23 Unspecified renal colic: Secondary | ICD-10-CM

## 2023-06-24 DIAGNOSIS — J4489 Other specified chronic obstructive pulmonary disease: Secondary | ICD-10-CM | POA: Diagnosis present

## 2023-06-24 HISTORY — DX: Pleurodynia: R07.81

## 2023-06-24 LAB — CBC WITH DIFFERENTIAL/PLATELET
Abs Immature Granulocytes: 0.07 10*3/uL (ref 0.00–0.07)
Basophils Absolute: 0 10*3/uL (ref 0.0–0.1)
Basophils Relative: 0 %
Eosinophils Absolute: 0 10*3/uL (ref 0.0–0.5)
Eosinophils Relative: 0 %
HCT: 40.1 % (ref 36.0–46.0)
Hemoglobin: 12.1 g/dL (ref 12.0–15.0)
Immature Granulocytes: 1 %
Lymphocytes Relative: 4 %
Lymphs Abs: 0.6 10*3/uL — ABNORMAL LOW (ref 0.7–4.0)
MCH: 29.1 pg (ref 26.0–34.0)
MCHC: 30.2 g/dL (ref 30.0–36.0)
MCV: 96.4 fL (ref 80.0–100.0)
Monocytes Absolute: 0.3 10*3/uL (ref 0.1–1.0)
Monocytes Relative: 2 %
Neutro Abs: 12.3 10*3/uL — ABNORMAL HIGH (ref 1.7–7.7)
Neutrophils Relative %: 93 %
Platelets: 245 10*3/uL (ref 150–400)
RBC: 4.16 MIL/uL (ref 3.87–5.11)
RDW: 14.3 % (ref 11.5–15.5)
WBC: 13.3 10*3/uL — ABNORMAL HIGH (ref 4.0–10.5)
nRBC: 0 % (ref 0.0–0.2)

## 2023-06-24 LAB — BLOOD GAS, VENOUS
Acid-Base Excess: 10.7 mmol/L — ABNORMAL HIGH (ref 0.0–2.0)
Bicarbonate: 38.5 mmol/L — ABNORMAL HIGH (ref 20.0–28.0)
O2 Saturation: 100 %
Patient temperature: 37
pCO2, Ven: 65 mm[Hg] — ABNORMAL HIGH (ref 44–60)
pH, Ven: 7.38 (ref 7.25–7.43)
pO2, Ven: 124 mm[Hg] — ABNORMAL HIGH (ref 32–45)

## 2023-06-24 LAB — COMPREHENSIVE METABOLIC PANEL
ALT: 17 U/L (ref 0–44)
AST: 15 U/L (ref 15–41)
Albumin: 3.3 g/dL — ABNORMAL LOW (ref 3.5–5.0)
Alkaline Phosphatase: 61 U/L (ref 38–126)
Anion gap: 10 (ref 5–15)
BUN: 18 mg/dL (ref 8–23)
CO2: 32 mmol/L (ref 22–32)
Calcium: 8.5 mg/dL — ABNORMAL LOW (ref 8.9–10.3)
Chloride: 98 mmol/L (ref 98–111)
Creatinine, Ser: 0.48 mg/dL (ref 0.44–1.00)
GFR, Estimated: >60 mL/min (ref >60)
Glucose, Bld: 158 mg/dL — ABNORMAL HIGH (ref 70–99)
Potassium: 3.6 mmol/L (ref 3.5–5.1)
Sodium: 140 mmol/L (ref 135–145)
Total Bilirubin: 0.4 mg/dL (ref 0.3–1.2)
Total Protein: 6.6 g/dL (ref 6.5–8.1)

## 2023-06-24 LAB — RESP PANEL BY RT-PCR (RSV, FLU A&B, COVID)  RVPGX2
Influenza A by PCR: NEGATIVE
Influenza B by PCR: NEGATIVE
Resp Syncytial Virus by PCR: NEGATIVE
SARS Coronavirus 2 by RT PCR: NEGATIVE

## 2023-06-24 MED ORDER — BUDESONIDE 0.25 MG/2ML IN SUSP
0.2500 mg | Freq: Two times a day (BID) | RESPIRATORY_TRACT | Status: DC
  Administered 2023-06-24 – 2023-06-27 (×6): 0.25 mg via RESPIRATORY_TRACT
  Filled 2023-06-24 (×6): qty 2

## 2023-06-24 MED ORDER — MORPHINE SULFATE (PF) 4 MG/ML IV SOLN
4.0000 mg | Freq: Once | INTRAVENOUS | Status: AC
  Administered 2023-06-24: 4 mg via INTRAVENOUS
  Filled 2023-06-24: qty 1

## 2023-06-24 MED ORDER — ALBUTEROL SULFATE (2.5 MG/3ML) 0.083% IN NEBU
15.0000 mg/h | INHALATION_SOLUTION | Freq: Once | RESPIRATORY_TRACT | Status: AC
  Administered 2023-06-24: 15 mg/h via RESPIRATORY_TRACT
  Filled 2023-06-24: qty 15

## 2023-06-24 MED ORDER — METHYLPREDNISOLONE SODIUM SUCC 40 MG IJ SOLR
40.0000 mg | Freq: Two times a day (BID) | INTRAMUSCULAR | Status: DC
  Administered 2023-06-24 – 2023-06-25 (×3): 40 mg via INTRAVENOUS
  Filled 2023-06-24 (×5): qty 1

## 2023-06-24 MED ORDER — ARFORMOTEROL TARTRATE 15 MCG/2ML IN NEBU
15.0000 ug | INHALATION_SOLUTION | Freq: Two times a day (BID) | RESPIRATORY_TRACT | Status: DC
  Administered 2023-06-24 – 2023-06-27 (×6): 15 ug via RESPIRATORY_TRACT
  Filled 2023-06-24 (×6): qty 2

## 2023-06-24 MED ORDER — IPRATROPIUM BROMIDE 0.02 % IN SOLN
1.0000 mg | Freq: Once | RESPIRATORY_TRACT | Status: AC
  Administered 2023-06-24: 1 mg via RESPIRATORY_TRACT
  Filled 2023-06-24: qty 5

## 2023-06-24 MED ORDER — IPRATROPIUM-ALBUTEROL 0.5-2.5 (3) MG/3ML IN SOLN: 3.0000 mL | Freq: Four times a day (QID) | RESPIRATORY_TRACT | Status: DC | PRN

## 2023-06-24 NOTE — ED Triage Notes (Signed)
Pt arrived with EMS reporting COPD history with stage 3 lung ca in remission. Endorses since yesterday she has had difficulty breathing. EMS gave 2 duoneb treatments, magnesium and solumedrol in route. Patient usually on 2L O2. Patient unable to speak in full sentences, tachypnea.

## 2023-06-24 NOTE — H&P (Signed)
History and Physical    Tracy Park:[id number] DOB: May 09, 1956 DOA: 06/24/2023  PCP: Jacquelin Hawking, PA-C  Patient coming from: Home  I have personally briefly reviewed patient's old medical records in [facility]  Chief Complaint: Shortness of breath  HPI: Tracy Park is a 67 y.o. female with medical history significant for PAF on Eliquis, COPD with chronic hypoxic respiratory failure on 2 L O2 via [place] at baseline, lung cancer s/p left upper lobectomy, HTN, HLD, hypothyroidism, depression who presented to the ED for evaluation of shortness of breath.  Patient recently admitted 10/10-10/13 for acute on chronic hypoxic respiratory failure due to COPD exacerbation.  Initially she required 6 L supplemental O2.  She was treated with steroids, nebulizers, and antibiotics with steady improvement.  She was weaned down to her home 2 L/min and discharged home.  Patient states that she has had persistent shortness of breath since discharge.  Dyspnea occurs both at rest and with exertion.  She reports chronic cough occasionally productive of white/yellow sputum which has not significantly changed from her baseline.  She also has been experiencing significant left flank pain.  She reports good urine output without dysuria.  Patient states that she had been smoking up to 1 PPD prior to her recent admission.  ED Course  Labs/Imaging on admission: I have personally reviewed following labs and imaging studies.  Initial labs showed BP 116/72, pulse 96, RR 26, temp 98.2 F, SpO2 initially 100%, dropped to 89% while on 4 L O2 via [place].  Labs show WBC 13.3, hemoglobin 12.1, platelets 245,000, sodium 140, potassium 3.6, bicarb 32, BUN 18, creatinine 0.48, serum glucose 158.  SARS-CoV-2, influenza, RSV PCR negative.  VBG shows pH 7.38, pCO2 65, pO2 124.  Portable chest x-ray negative for focal consolidation, edema, effusion.  Emphysematous changes noted.  CT renal stone study showed  punctate calculus in the posterior aspect of the right bladder with trace right-sided hydronephrosis which may represent recently passed stone.  Additional punctate nonobstructing calculi in the right kidney and minimal patchy airspace opacity in the left lung base with some peripheral mucous plugging noted.  Patient was given albuterol and Atrovent nebulizers, IV morphine 4 mg.  The hospitalist service was consulted to admit for further evaluation and management.  Review of Systems: All systems reviewed and are negative except as documented in history of present illness above.   Past Medical History:  Diagnosis Date   Anginal pain (HCC)    Anxiety    Bipolar disorder (HCC)    Cancer (HCC)    COPD (chronic obstructive pulmonary disease) (HCC)    Dyspnea    Family history of adverse reaction to anesthesia    History of kidney stones    Hydroureteronephrosis 08/16/2021   Hypothyroidism    Lung cancer (HCC)    Myocardial infarction (HCC)    Paroxysmal atrial fibrillation (HCC)    PTSD (post-traumatic stress disorder)    Sleep apnea    Thyroid disease     Past Surgical History:  Procedure Laterality Date   ABDOMINAL HYSTERECTOMY     BACK SURGERY     CYSTOSCOPY W/ URETERAL STENT PLACEMENT Right 05/03/2021   Procedure: CYSTOSCOPY WITH RETROGRADE PYELOGRAM/URETERAL STENT PLACEMENT;  Surgeon: Crist Fat, MD;  Location: [facility];  Service: Urology;  Laterality: Right;   EYE SURGERY     kidney stent     thyroidectomy      Social History:  reports that she has been smoking cigarettes.  She has never used smokeless tobacco. She reports that she does not currently use alcohol. She reports that she does not currently use drugs.  Allergies  Allergen Reactions   Red Dye #40 (Allura Red) Hives, Itching and Other (See Comments)    Red food dye   Strawberry Extract Hives and Itching   Tomato Hives and Itching   Oxycontin [Oxycodone Hcl] Other (See Comments)    Hallucinations     Aspirin Hives   Tape Rash and Other (See Comments)    Prefers paper tape   Wound Dressing Adhesive Rash    Family History  Problem Relation Age of Onset   Hypertension Mother    Heart failure Mother    Hypertension Father    Diabetes Father    Heart failure Father      Prior to Admission medications   Medication Sig Start Date End Date Taking? Authorizing Provider  albuterol (PROVENTIL) (2.5 MG/3ML) 0.083% nebulizer solution Take 3 mLs (2.5 mg total) by nebulization every 4 (four) hours while awake for 3 days, THEN 3 mLs (2.5 mg total) every 4 (four) hours as needed for wheezing or shortness of breath. 06/22/23 07/25/23  Narda Bonds, MD  albuterol (VENTOLIN HFA) 108 (90 Base) MCG/ACT inhaler Inhale 2 puffs into the lungs every 6 (six) hours as needed for wheezing or shortness of breath. Patient taking differently: Inhale 2 puffs into the lungs every 4 (four) hours as needed for wheezing or shortness of breath. 01/13/23   Burnadette Pop, MD  ALPRAZolam Prudy Feeler) 0.5 MG tablet Take 0.5 mg by mouth in the morning and at bedtime. 03/30/23   [provider]  apixaban (ELIQUIS) 5 MG TABS tablet Take 1 tablet (5 mg total) by mouth 2 (two) times daily. 06/06/23   Jonah Blue, MD  Budeson-Glycopyrrol-Formoterol (BREZTRI AEROSPHERE) 160-9-4.8 MCG/ACT AERO Inhale 1 puff into the lungs in the morning and at bedtime.    [provider]  doxycycline (VIBRAMYCIN) 100 MG capsule Take 1 capsule (100 mg total) by mouth 2 (two) times daily for 5 days. 06/22/23 06/27/23  Narda Bonds, MD  guaiFENesin (MUCINEX) 600 MG 12 hr tablet Take 2 tablets (1,200 mg total) by mouth 2 (two) times daily for 3 days. 06/22/23 06/25/23  Narda Bonds, MD  levothyroxine (SYNTHROID) 125 MCG tablet Take 125 mcg by mouth daily before breakfast.    [provider]  losartan (COZAAR) 25 MG tablet Take 12.5 mg by mouth daily.    [provider]  magnesium oxide (MAG-OX) 400 (240 Mg) MG  tablet Take 400 mg by mouth in the morning.    [provider]  metoprolol succinate (TOPROL-XL) 25 MG 24 hr tablet Take 0.5 tablets (12.5 mg total) by mouth daily. 06/22/23 07/22/23  Narda Bonds, MD  nitroGLYCERIN (NITROSTAT) 0.4 MG SL tablet Place 0.4 mg under the tongue every 5 (five) minutes as needed for chest pain.    [provider]  OXYGEN Inhale 2 L/min into the lungs continuous.    [provider]  predniSONE (DELTASONE) 10 MG tablet Take 4 tablets (40 mg total) by mouth daily with breakfast for 3 days. 06/22/23 06/25/23  Narda Bonds, MD  rosuvastatin (CRESTOR) 20 MG tablet Take 20 mg by mouth daily.    [provider]  sertraline (ZOLOFT) 25 MG tablet Take 25 mg by mouth in the morning.    [provider]  SYSTANE COMPLETE PF 0.6 % SOLN Place 1 drop into both eyes 3 (  three) times daily as needed (for dryness).    [provider]  TYLENOL 8 HOUR 650 MG CR tablet Take 650-1,300 mg by mouth 2 (two) times daily as needed for pain.    [provider]    Physical Exam: Vitals:   06/24/23 2130 06/24/23 2145 06/24/23 2159 06/24/23 2300  BP: (!) 98/58 (!) 92/55 (!) 92/55 117/87  Pulse:   97 97  Resp: 17 (!) 32 18 (!) 26  Temp:   98.1 F (36.7 C)   TempSrc:   Oral   SpO2:   100% 100%   Constitutional: Thin chronically ill-appearing woman resting in bed.  NAD, calm. Eyes: EOMI, lids and conjunctivae normal ENMT: Mucous membranes are moist. Posterior pharynx clear of any exudate or lesions.Normal dentition.  Neck: normal, supple, no masses. Respiratory: Expiratory wheezing throughout. Normal respiratory effort while on 3 L O2 via [place]. No accessory muscle use.  Cardiovascular: Regular rate and rhythm, no murmurs / rubs / gallops. No extremity edema. 2+ pedal pulses. Abdomen: no tenderness, no masses palpated.  Musculoskeletal: no clubbing / cyanosis. No joint deformity upper and lower extremities. Good ROM, no  contractures. Normal muscle tone.  Skin: no rashes, lesions, ulcers. No induration Neurologic: Sensation intact. Strength 5/5 in all 4.  Psychiatric: Normal judgment and insight. Alert and oriented x 3. Normal mood.   EKG: Personally reviewed. Sinus rhythm, rate 96, RAE, LVH.  Rate is slower when compared to previous.  Assessment/Plan Principal Problem:   COPD with acute exacerbation (HCC) Active Problems:   Acute on chronic respiratory failure with hypoxia (HCC)   Dyslipidemia   Anxiety and depression   Essential hypertension   Paroxysmal atrial fibrillation (HCC)   Tracy Park is a 67 y.o. female with medical history significant for PAF on Eliquis, COPD with chronic hypoxic respiratory failure on 2 L O2 via [place] at baseline, lung cancer s/p left upper lobectomy, HTN, HLD, hypothyroidism, depression who is admitted with acute on chronic hypoxic respiratory failure due to COPD exacerbation.  Assessment and Plan: Acute on chronic hypoxic respiratory failure due to acute COPD exacerbation: Readmitted with persistent dyspnea.  Significant wheezing noted on admission.  Increased supplemental O2 requirement from home 2 L to 4 L on arrival. -Scheduled Brovana/Pulmicort BID -IV Solu-Medrol 40 mg twice daily -DuoNebs as needed -Continue doxycycline -Continue supplemental O2 and wean to home 2 L via [place] as able  Paroxysmal atrial fibrillation: Rate controlled with sinus rhythm on admission.  Continue Eliquis and Toprol-XL.  Left flank pain: Patient has been experiencing intermittent left flank pain.  CT renal stone study was suggestive of potential recently passed stone although thought to be right-sided due to trace right-sided hydronephrosis.  Hypertension: BP has been borderline low.  Continue Toprol-XL but hold losartan for now.  Hypothyroidism: Continue Synthroid.  Hyperlipidemia: Continue rosuvastatin.  Depression/anxiety: Continue sertraline and home Xanax as needed.    DVT prophylaxis:  apixaban (ELIQUIS) tablet 5 mg   Code Status:   Code Status: Limited: Do not attempt resuscitation (DNR) -DNR-LIMITED -Do Not Intubate/DNI Confirmed with patient on admission. Family Communication: Discussed with patient, she has discussed with family Disposition Plan: From home, dispo pending clinical progress Consults called: None Severity of Illness: The appropriate patient status for this patient is OBSERVATION. Observation status is judged to be reasonable and necessary in order to provide the required intensity of service to ensure the patient's safety. The patient's presenting symptoms, physical exam findings, and initial radiographic and laboratory data in the  context of their medical condition is felt to place them at decreased risk for further clinical deterioration. Furthermore, it is anticipated that the patient will be medically stable for discharge from the hospital within 2 midnights of admission.   Darreld Mclean MD [facility]  If 7PM-7AM, please contact night-coverage [email or web address]  06/25/2023, 12:48 AM

## 2023-06-24 NOTE — ED Provider Notes (Signed)
[place] EMERGENCY DEPARTMENT AT [facility] Provider Note   CSN: [id number] Arrival date & time: 06/24/23  1623     History  Chief Complaint  Patient presents with   Shortness of Breath    Tracy Park is a 67 y.o. female.  Tracy Park is a 67 yo F with PMH of COPD on 2L maintenance oxygen as well as stage 3 lung cancer with L lobectomy presenting with dyspnea and L back pain. She was discharged from the hospital on Sunday 10/13 for a COPD exacerbation. She reports feeling poorly on day of discharge but well on Monday, yesterday, until the evening. Then, she developed sudden L mid-back pain and dyspnea. She attributes her back pain to her lungs, citing her lung surgery, and notes that she has occasional pain from it but never like this. She was able to sleep somewhat last night, but the dyspnea and back pain have progressed. 4 nebulizer treatments at home have not helped her. She denies sick contacts or sputum. She does still smoke, having had about 5 cigarettes since she left the hospital. She denies headaches, fevers, chest pain, abdominal pain, changes in bowel/bladder habits, dysuria.    Shortness of Breath Associated symptoms: cough and wheezing   Associated symptoms: no abdominal pain, no chest pain, no fever, no headaches and no vomiting        Home Medications Prior to Admission medications   Medication Sig Start Date End Date Taking? Authorizing Provider  albuterol (PROVENTIL) (2.5 MG/3ML) 0.083% nebulizer solution Take 3 mLs (2.5 mg total) by nebulization every 4 (four) hours while awake for 3 days, THEN 3 mLs (2.5 mg total) every 4 (four) hours as needed for wheezing or shortness of breath. 06/22/23 07/25/23  Narda Bonds, MD  albuterol (VENTOLIN HFA) 108 (90 Base) MCG/ACT inhaler Inhale 2 puffs into the lungs every 6 (six) hours as needed for wheezing or shortness of breath. Patient taking differently: Inhale 2 puffs into the lungs every 4 (four)  hours as needed for wheezing or shortness of breath. 01/13/23   Burnadette Pop, MD  ALPRAZolam Prudy Feeler) 0.5 MG tablet Take 0.5 mg by mouth in the morning and at bedtime. 03/30/23   [provider]  apixaban (ELIQUIS) 5 MG TABS tablet Take 1 tablet (5 mg total) by mouth 2 (two) times daily. 06/06/23   Jonah Blue, MD  Budeson-Glycopyrrol-Formoterol (BREZTRI AEROSPHERE) 160-9-4.8 MCG/ACT AERO Inhale 1 puff into the lungs in the morning and at bedtime.    [provider]  doxycycline (VIBRAMYCIN) 100 MG capsule Take 1 capsule (100 mg total) by mouth 2 (two) times daily for 5 days. 06/22/23 06/27/23  Narda Bonds, MD  guaiFENesin (MUCINEX) 600 MG 12 hr tablet Take 2 tablets (1,200 mg total) by mouth 2 (two) times daily for 3 days. 06/22/23 06/25/23  Narda Bonds, MD  levothyroxine (SYNTHROID) 125 MCG tablet Take 125 mcg by mouth daily before breakfast.    [provider]  losartan (COZAAR) 25 MG tablet Take 12.5 mg by mouth daily.    [provider]  magnesium oxide (MAG-OX) 400 (240 Mg) MG tablet Take 400 mg by mouth in the morning.    [provider]  metoprolol succinate (TOPROL-XL) 25 MG 24 hr tablet Take 0.5 tablets (12.5 mg total) by mouth daily. 06/22/23 07/22/23  Narda Bonds, MD  nitroGLYCERIN (NITROSTAT) 0.4 MG SL tablet Place 0.4 mg under the tongue every 5 (five) minutes as needed for chest pain.  [provider]  OXYGEN Inhale 2 L/min into the lungs continuous.    [provider]  predniSONE (DELTASONE) 10 MG tablet Take 4 tablets (40 mg total) by mouth daily with breakfast for 3 days. 06/22/23 06/25/23  Narda Bonds, MD  rosuvastatin (CRESTOR) 20 MG tablet Take 20 mg by mouth daily.    [provider]  sertraline (ZOLOFT) 25 MG tablet Take 25 mg by mouth in the morning.    [provider]  SYSTANE COMPLETE PF 0.6 % SOLN Place 1 drop into both eyes 3 (three) times daily as needed (for dryness).     [provider]  TYLENOL 8 HOUR 650 MG CR tablet Take 650-1,300 mg by mouth 2 (two) times daily as needed for pain.    [provider]      Allergies    Red dye #40 (allura red), Strawberry extract, Tomato, Oxycontin [oxycodone hcl], Aspirin, Tape, and Wound dressing adhesive    Review of Systems   Review of Systems  Constitutional:  Negative for chills, fatigue and fever.  HENT:  Negative for congestion and rhinorrhea.   Respiratory:  Positive for cough, chest tightness, shortness of breath and wheezing.   Cardiovascular:  Negative for chest pain, palpitations and leg swelling.  Gastrointestinal:  Negative for abdominal distention, abdominal pain, diarrhea, nausea and vomiting.  Genitourinary:  Negative for dysuria, frequency and hematuria.  Musculoskeletal:  Positive for back pain.  Skin:  Negative for wound.  Neurological:  Negative for weakness, light-headedness and headaches.    Physical Exam Updated Vital Signs BP (!) 92/55 (BP Location: Right Arm)   Pulse 97   Temp 98.1 F (36.7 C) (Oral)   Resp 18   SpO2 100%  Physical Exam Constitutional:      Appearance: She is well-developed. She is ill-appearing.  HENT:     Head: Normocephalic and atraumatic.  Cardiovascular:     Rate and Rhythm: Normal rate and regular rhythm.     Heart sounds: Normal heart sounds.  Pulmonary:     Effort: Pulmonary effort is normal. Tachypnea present. No accessory muscle usage or respiratory distress.     Breath sounds: Wheezing present. No decreased breath sounds, rhonchi or rales.  Chest:     Chest wall: No tenderness.  Abdominal:     General: Bowel sounds are normal.     Palpations: Abdomen is soft.     Tenderness: There is no abdominal tenderness. There is left CVA tenderness.  Musculoskeletal:     Right lower leg: No tenderness. No edema.     Left lower leg: No tenderness. No edema.  Skin:    General: Skin is warm and dry.  Neurological:     General: No focal  deficit present.     Mental Status: She is alert and oriented to person, place, and time.  Psychiatric:        Mood and Affect: Mood normal.        Behavior: Behavior normal.     ED Results / Procedures / Treatments   Labs (all labs ordered are listed, but only abnormal results are displayed) Labs Reviewed  CBC WITH DIFFERENTIAL/PLATELET - Abnormal; Notable for the following components:      Result Value   WBC 13.3 (*)    Neutro Abs 12.3 (*)    Lymphs Abs 0.6 (*)    All other components within normal limits  COMPREHENSIVE METABOLIC PANEL - Abnormal; Notable for the following components:   Glucose, Bld 158 (*)  Calcium 8.5 (*)    Albumin 3.3 (*)    All other components within normal limits  BLOOD GAS, VENOUS - Abnormal; Notable for the following components:   pCO2, Ven 65 (*)    pO2, Ven 124 (*)    Bicarbonate 38.5 (*)    Acid-Base Excess 10.7 (*)    All other components within normal limits  RESP PANEL BY RT-PCR (RSV, FLU A&B, COVID)  [id number]  URINALYSIS, ROUTINE W REFLEX MICROSCOPIC    EKG EKG Interpretation Date/Time:  Tuesday June 24 2023 18:42:46 EDT Ventricular Rate:  96 PR Interval:  123 QRS Duration:  102 QT Interval:  362 QTC Calculation: 460 R Axis:   37  Text Interpretation: Sinus rhythm Right atrial enlargement RSR' in V1 or V2, probably normal variant Left ventricular hypertrophy No significant change since last tracing Confirmed by Richardean Canal [phone]) on 06/24/2023 8:03:48 PM  Radiology CT Renal Stone Study  Result Date: 06/24/2023 CLINICAL DATA:  Flank and abdominal pain EXAM: CT ABDOMEN AND PELVIS WITHOUT CONTRAST TECHNIQUE: Multidetector CT imaging of the abdomen and pelvis was performed following the standard protocol without IV contrast. RADIATION DOSE REDUCTION: This exam was performed according to the departmental dose-optimization program which includes automated exposure control, adjustment of the mA and/or kV according to patient size  and/or use of iterative reconstruction technique. COMPARISON:  CT angiogram chest abdomen and pelvis 03/30/2022. FINDINGS: Lower chest: Minimal patchy airspace opacities are seen in the left lung base with some peripheral mucous plugging. Mild emphysema present. Hepatobiliary: No focal liver abnormality is seen. No gallstones, gallbladder wall thickening, or biliary dilatation. Pancreas: Unremarkable. No pancreatic ductal dilatation or surrounding inflammatory changes. Spleen: Normal in size without focal abnormality. Adrenals/Urinary Tract: There are punctate nonobstructing calculi in the lower pole the right kidney. There is a punctate calculus in the posterior aspect of the right bladder. There is trace right-sided hydronephrosis. The left kidney and adrenal glands are within normal limits. Stomach/Bowel: Stomach is within normal limits. No evidence of bowel wall thickening, distention, or inflammatory changes. The appendix is not seen. Vascular/Lymphatic: Aortic atherosclerosis. No enlarged abdominal or pelvic lymph nodes. Reproductive: Status post hysterectomy. No adnexal masses. Other: No abdominal wall hernia or abnormality. No abdominopelvic ascites. Musculoskeletal: No acute osseous findings. Stable grade 1 anterolisthesis at L4-L5. IMPRESSION: 1. Punctate calculus in the posterior aspect of the right bladder with trace right-sided hydronephrosis. Findings may represent recently passed stone. 2. Additional punctate nonobstructing calculi in the right kidney. 3. Minimal patchy airspace opacities in the left lung base with some peripheral mucous plugging. Findings may represent infection or aspiration. Aortic Atherosclerosis (ICD10-I70.0). Electronically Signed   By: Darliss Cheney M.D.   On: 06/24/2023 21:57   DG Chest Port 1 View  Result Date: 06/24/2023 CLINICAL DATA:  Shortness of breath lung cancer EXAM: PORTABLE CHEST 1 VIEW COMPARISON:  06/19/2023, CT 06/05/2023, 03/29/2022 FINDINGS: Volume loss  and postsurgical changes on the left with left apical pleural and parenchymal scarring. Tracheal deviation to the left as before. Stable cardiac size. No acute airspace disease. Emphysema IMPRESSION: No active disease. Postsurgical changes on the left.  Emphysema Electronically Signed   By: Jasmine Pang M.D.   On: 06/24/2023 20:33    Procedures Procedures    Medications Ordered in ED Medications  albuterol (PROVENTIL) (2.5 MG/3ML) 0.083% nebulizer solution (15 mg/hr Nebulization Given 06/24/23 1829)  ipratropium (ATROVENT) nebulizer solution 1 mg (1 mg Nebulization Given 06/24/23 1831)  morphine (PF) 4 MG/ML injection 4 mg (4  mg Intravenous Given 06/24/23 1848)    ED Course/ Medical Decision Making/ A&P                                 Medical Decision Making Tracy Park is a 67 yo F with PMH of COPD on 2L maintenance oxygen as well as stage 3 lung cancer with L lobectomy presenting with dyspnea and L back pain. High suspicion for COPD exacerbation given her chronic disease and recent hospital discharge. She is currently requiring more oxygen from her baseline of 2L, needing 4-6L. Duoneb treatment provided. At times she can be weaned to near baseline when at rest in bed but any movement or effort results in desaturations to 80%. However, there are no signs of infection on workup, respiratory panel negative. She is additionally complaining of very severe L sided pain - CT renal stone study shows a stone in the bladder, possibly just passed. She has required pain management with morphine while here. Given her pain as well as increased oxygen requirements, discussed admission with hospitalist service and Dr. Allena Katz will accept her.  Amount and/or Complexity of Data Reviewed Labs: ordered. Radiology: ordered. ECG/medicine tests: ordered.  Risk Prescription drug management.          Final Clinical Impression(s) / ED Diagnoses Final diagnoses:  COPD exacerbation (HCC)  Renal colic  on right side    Rx / DC Orders ED Discharge Orders     None         Katheran James, DO 06/24/23 2259    Charlynne Pander, MD 06/25/23 1021

## 2023-06-25 ENCOUNTER — Observation Stay (HOSPITAL_COMMUNITY): Payer: 59

## 2023-06-25 ENCOUNTER — Other Ambulatory Visit: Payer: Self-pay

## 2023-06-25 ENCOUNTER — Encounter (HOSPITAL_COMMUNITY): Payer: Self-pay | Admitting: Internal Medicine

## 2023-06-25 DIAGNOSIS — R0781 Pleurodynia: Secondary | ICD-10-CM | POA: Diagnosis not present

## 2023-06-25 LAB — CBC
HCT: 35.2 % — ABNORMAL LOW (ref 36.0–46.0)
Hemoglobin: 10.9 g/dL — ABNORMAL LOW (ref 12.0–15.0)
MCH: 29.9 pg (ref 26.0–34.0)
MCHC: 31 g/dL (ref 30.0–36.0)
MCV: 96.7 fL (ref 80.0–100.0)
Platelets: 225 10*3/uL (ref 150–400)
RBC: 3.64 MIL/uL — ABNORMAL LOW (ref 3.87–5.11)
RDW: 14.1 % (ref 11.5–15.5)
WBC: 7.2 10*3/uL (ref 4.0–10.5)
nRBC: 0 % (ref 0.0–0.2)

## 2023-06-25 LAB — URINALYSIS, ROUTINE W REFLEX MICROSCOPIC
Bacteria, UA: NONE SEEN
Bilirubin Urine: NEGATIVE
Glucose, UA: 50 mg/dL — AB
Ketones, ur: NEGATIVE mg/dL
Leukocytes,Ua: NEGATIVE
Nitrite: NEGATIVE
Protein, ur: NEGATIVE mg/dL
Specific Gravity, Urine: 1.023 (ref 1.005–1.030)
pH: 6 (ref 5.0–8.0)

## 2023-06-25 LAB — BASIC METABOLIC PANEL
Anion gap: 9 (ref 5–15)
BUN: 20 mg/dL (ref 8–23)
CO2: 33 mmol/L — ABNORMAL HIGH (ref 22–32)
Calcium: 8.5 mg/dL — ABNORMAL LOW (ref 8.9–10.3)
Chloride: 98 mmol/L (ref 98–111)
Creatinine, Ser: 0.41 mg/dL — ABNORMAL LOW (ref 0.44–1.00)
GFR, Estimated: >60 mL/min (ref >60)
Glucose, Bld: 119 mg/dL — ABNORMAL HIGH (ref 70–99)
Potassium: 4.1 mmol/L (ref 3.5–5.1)
Sodium: 140 mmol/L (ref 135–145)

## 2023-06-25 LAB — D-DIMER, QUANTITATIVE: D-Dimer, Quant: 0.45 ug{FEU}/mL (ref 0.00–0.50)

## 2023-06-25 LAB — CULTURE, RESPIRATORY W GRAM STAIN

## 2023-06-25 LAB — TROPONIN I (HIGH SENSITIVITY): Troponin I (High Sensitivity): 8 ng/L (ref <18)

## 2023-06-25 MED ORDER — GUAIFENESIN ER 600 MG PO TB12
1200.0000 mg | ORAL_TABLET | Freq: Two times a day (BID) | ORAL | Status: DC
  Administered 2023-06-25 – 2023-06-27 (×6): 1200 mg via ORAL
  Filled 2023-06-25 (×6): qty 2

## 2023-06-25 MED ORDER — IOHEXOL 300 MG/ML  SOLN
75.0000 mL | Freq: Once | INTRAMUSCULAR | Status: AC | PRN
  Administered 2023-06-25: 75 mL via INTRAVENOUS

## 2023-06-25 MED ORDER — ACETAMINOPHEN 500 MG PO TABS
1000.0000 mg | ORAL_TABLET | Freq: Four times a day (QID) | ORAL | Status: DC | PRN
  Administered 2023-06-26 – 2023-06-27 (×3): 1000 mg via ORAL
  Filled 2023-06-25 (×3): qty 2

## 2023-06-25 MED ORDER — SODIUM CHLORIDE 0.9% FLUSH
3.0000 mL | Freq: Two times a day (BID) | INTRAVENOUS | Status: DC
  Administered 2023-06-25 – 2023-06-26 (×4): 3 mL via INTRAVENOUS

## 2023-06-25 MED ORDER — ROSUVASTATIN CALCIUM 20 MG PO TABS
20.0000 mg | ORAL_TABLET | Freq: Every day | ORAL | Status: DC
  Administered 2023-06-25 – 2023-06-27 (×3): 20 mg via ORAL
  Filled 2023-06-25 (×3): qty 1

## 2023-06-25 MED ORDER — HYDROCOD POLI-CHLORPHE POLI ER 10-8 MG/5ML PO SUER
5.0000 mL | Freq: Two times a day (BID) | ORAL | Status: DC | PRN
  Administered 2023-06-25: 5 mL via ORAL
  Filled 2023-06-25: qty 5

## 2023-06-25 MED ORDER — METOPROLOL SUCCINATE ER 25 MG PO TB24
12.5000 mg | ORAL_TABLET | Freq: Every day | ORAL | Status: DC
  Administered 2023-06-25 – 2023-06-27 (×3): 12.5 mg via ORAL
  Filled 2023-06-25 (×3): qty 1

## 2023-06-25 MED ORDER — ALPRAZOLAM 0.5 MG PO TABS
0.5000 mg | ORAL_TABLET | Freq: Two times a day (BID) | ORAL | Status: DC | PRN
  Administered 2023-06-25 – 2023-06-26 (×2): 0.5 mg via ORAL
  Filled 2023-06-25 (×2): qty 1

## 2023-06-25 MED ORDER — SERTRALINE HCL 25 MG PO TABS
25.0000 mg | ORAL_TABLET | Freq: Every day | ORAL | Status: DC
  Administered 2023-06-25 – 2023-06-27 (×3): 25 mg via ORAL
  Filled 2023-06-25 (×3): qty 1

## 2023-06-25 MED ORDER — ONDANSETRON HCL 4 MG PO TABS: 4.0000 mg | ORAL_TABLET | Freq: Four times a day (QID) | ORAL | Status: DC | PRN

## 2023-06-25 MED ORDER — APIXABAN 5 MG PO TABS
5.0000 mg | ORAL_TABLET | Freq: Two times a day (BID) | ORAL | Status: DC
  Administered 2023-06-25 – 2023-06-27 (×6): 5 mg via ORAL
  Filled 2023-06-25 (×6): qty 1

## 2023-06-25 MED ORDER — DOXYCYCLINE HYCLATE 100 MG PO TABS
100.0000 mg | ORAL_TABLET | Freq: Two times a day (BID) | ORAL | Status: DC
  Administered 2023-06-25 – 2023-06-26 (×5): 100 mg via ORAL
  Filled 2023-06-25 (×5): qty 1

## 2023-06-25 MED ORDER — METHOCARBAMOL 500 MG PO TABS
500.0000 mg | ORAL_TABLET | Freq: Three times a day (TID) | ORAL | Status: DC | PRN
  Administered 2023-06-25 (×2): 500 mg via ORAL
  Filled 2023-06-25 (×2): qty 1

## 2023-06-25 MED ORDER — ONDANSETRON HCL 4 MG/2ML IJ SOLN: 4.0000 mg | Freq: Four times a day (QID) | INTRAMUSCULAR | Status: DC | PRN

## 2023-06-25 MED ORDER — LEVOTHYROXINE SODIUM 25 MCG PO TABS
125.0000 ug | ORAL_TABLET | Freq: Every day | ORAL | Status: DC
  Administered 2023-06-25 – 2023-06-27 (×3): 125 ug via ORAL
  Filled 2023-06-25 (×3): qty 1

## 2023-06-25 MED ORDER — ACETAMINOPHEN 650 MG RE SUPP: 650.0000 mg | Freq: Four times a day (QID) | RECTAL | Status: DC | PRN

## 2023-06-25 NOTE — Assessment & Plan Note (Signed)
Continue Crestor 

## 2023-06-25 NOTE — ED Notes (Signed)
ED TO INPATIENT HANDOFF REPORT  ED Nurse Name and Phone #: Toy Cookey, RN  S Name/Age/Gender Tracy Park 67 y.o. female Room/Bed: WA21/WA21  Code Status   Code Status: Limited: Do not attempt resuscitation (DNR) -DNR-LIMITED -Do Not Intubate/DNI   Home/SNF/Other Home Patient oriented to: self, place, time, and situation Is this baseline? Yes   Triage Complete: Triage complete  Chief Complaint COPD with acute exacerbation (HCC) [J44.1]  Triage Note Pt arrived with EMS reporting COPD history with stage 3 lung ca in remission. Endorses since yesterday she has had difficulty breathing. EMS gave 2 duoneb treatments, magnesium and solumedrol in route. Patient usually on 2L O2. Patient unable to speak in full sentences, tachypnea.    Allergies Allergies  Allergen Reactions   Red Dye #40 (Allura Red) Hives, Itching and Other (See Comments)    Red food dye   Strawberry Extract Hives and Itching   Tomato Hives and Itching   Oxycontin [Oxycodone Hcl] Other (See Comments)    Hallucinations    Aspirin Hives   Tape Rash and Other (See Comments)    Prefers paper tape   Wound Dressing Adhesive Rash    Level of Care/Admitting Diagnosis ED Disposition     ED Disposition  Admit   Condition  --   Comment  Hospital Area: [facility] [[id number]]  Level of Care: Telemetry [5]  Admit to tele based on following criteria: Monitor QTC interval  May place patient in observation at [facility] or [facility] if equivalent level of care is available:: No  Covid Evaluation: Confirmed COVID Negative  Diagnosis: COPD with acute exacerbation [facility]) [[id number]]  Admitting Physician: Charlsie Quest [[id number]]  Attending Physician: Charlsie Quest [[id number]]          B Medical/Surgery History Past Medical History:  Diagnosis Date   Anginal pain (HCC)    Anxiety    Bipolar disorder (HCC)    Cancer (HCC)    COPD (chronic obstructive pulmonary disease) (HCC)    Dyspnea     Family history of adverse reaction to anesthesia    History of kidney stones    Hydroureteronephrosis 08/16/2021   Hypothyroidism    Lung cancer (HCC)    Myocardial infarction (HCC)    Paroxysmal atrial fibrillation (HCC)    PTSD (post-traumatic stress disorder)    Sleep apnea    Thyroid disease    Past Surgical History:  Procedure Laterality Date   ABDOMINAL HYSTERECTOMY     BACK SURGERY     CYSTOSCOPY W/ URETERAL STENT PLACEMENT Right 05/03/2021   Procedure: CYSTOSCOPY WITH RETROGRADE PYELOGRAM/URETERAL STENT PLACEMENT;  Surgeon: Crist Fat, MD;  Location: [facility];  Service: Urology;  Laterality: Right;   EYE SURGERY     kidney stent     thyroidectomy       A IV Location/Drains/Wounds Patient Lines/Drains/Airways Status     Active Line/Drains/Airways     Name Placement date Placement time Site Days   Peripheral IV 06/24/23 20 G Left Antecubital 06/24/23  1840  Antecubital  1            Intake/Output Last 24 hours No intake or output data in the 24 hours ending 06/25/23 9629  Labs/Imaging Results for orders placed or performed during the hospital encounter of 06/24/23 (from the past 48 hour(s))  CBC with Differential     Status: Abnormal   Collection Time: 06/24/23  6:18 PM  Result Value Ref Range   WBC 13.3 (H) 4.0 -  10.5 K/uL   RBC 4.16 3.87 - 5.11 MIL/uL   Hemoglobin 12.1 12.0 - 15.0 g/dL   HCT 01.0 27.2 - 53.6 %   MCV 96.4 80.0 - 100.0 fL   MCH 29.1 26.0 - 34.0 pg   MCHC 30.2 30.0 - 36.0 g/dL   RDW 64.4 03.4 - 74.2 %   Platelets 245 150 - 400 K/uL   nRBC 0.0 0.0 - 0.2 %   Neutrophils Relative % 93 %   Neutro Abs 12.3 (H) 1.7 - 7.7 K/uL   Lymphocytes Relative 4 %   Lymphs Abs 0.6 (L) 0.7 - 4.0 K/uL   Monocytes Relative 2 %   Monocytes Absolute 0.3 0.1 - 1.0 K/uL   Eosinophils Relative 0 %   Eosinophils Absolute 0.0 0.0 - 0.5 K/uL   Basophils Relative 0 %   Basophils Absolute 0.0 0.0 - 0.1 K/uL   Immature Granulocytes 1 %   Abs Immature  Granulocytes 0.07 0.00 - 0.07 K/uL    Comment: Performed at [facility], [address], [place], [place] [address]  Comprehensive metabolic panel     Status: Abnormal   Collection Time: 06/24/23  6:18 PM  Result Value Ref Range   Sodium 140 135 - 145 mmol/L   Potassium 3.6 3.5 - 5.1 mmol/L   Chloride 98 98 - 111 mmol/L   CO2 32 22 - 32 mmol/L   Glucose, Bld 158 (H) 70 - 99 mg/dL    Comment: Glucose reference range applies only to samples taken after fasting for at least 8 hours.   BUN 18 8 - 23 mg/dL   Creatinine, Ser 8.75 0.44 - 1.00 mg/dL   Calcium 8.5 (L) 8.9 - 10.3 mg/dL   Total Protein 6.6 6.5 - 8.1 g/dL   Albumin 3.3 (L) 3.5 - 5.0 g/dL   AST 15 15 - 41 U/L   ALT 17 0 - 44 U/L   Alkaline Phosphatase 61 38 - 126 U/L   Total Bilirubin 0.4 0.3 - 1.2 mg/dL   GFR, Estimated >64 >33 mL/min    Comment: (NOTE) Calculated using the CKD-EPI Creatinine Equation (2021)    Anion gap 10 5 - 15    Comment: Performed at [facility], [address] [place], [place] [address]  Blood gas, venous (at [facility] and [facility])     Status: Abnormal   Collection Time: 06/24/23  6:32 PM  Result Value Ref Range   pH, Ven 7.38 7.25 - 7.43   pCO2, Ven 65 (H) 44 - 60 mmHg   pO2, Ven 124 (H) 32 - 45 mmHg   Bicarbonate 38.5 (H) 20.0 - 28.0 mmol/L   Acid-Base Excess 10.7 (H) 0.0 - 2.0 mmol/L   O2 Saturation 100 %   Patient temperature 37.0     Comment: Performed at [facility], [address], [place], [place] [address]  Resp panel by RT-PCR (RSV, Flu A&B, Covid) Anterior Nasal Swab     Status: None   Collection Time: 06/24/23  6:53 PM   Specimen: Anterior Nasal Swab  Result Value Ref Range   SARS Coronavirus 2 by RT PCR NEGATIVE NEGATIVE    Comment: (NOTE) SARS-CoV-2 target nucleic acids are NOT DETECTED.  The SARS-CoV-2 RNA is generally detectable in upper respiratory specimens during the acute phase of infection. The lowest concentration of  SARS-CoV-2 viral copies this assay can detect is 138 copies/mL. A negative result does not preclude SARS-Cov-2 infection and should not be used  as the sole basis for treatment or other patient management decisions. A negative result may occur with  improper specimen collection/handling, submission of specimen other than nasopharyngeal swab, presence of viral mutation(s) within the areas targeted by this assay, and inadequate number of viral copies(<138 copies/mL). A negative result must be combined with clinical observations, patient history, and epidemiological information. The expected result is Negative.  Fact Sheet for Patients:  BloggerCourse.com  Fact Sheet for Healthcare Providers:  SeriousBroker.it  This test is no t yet approved or cleared by the [place] FDA and  has been authorized for detection and/or diagnosis of SARS-CoV-2 by FDA under an Emergency Use Authorization (EUA). This EUA will remain  in effect (meaning this test can be used) for the duration of the COVID-19 declaration under Section 564(b)(1) of the Act, 21 U.S.C.section 360bbb-3(b)(1), unless the authorization is terminated  or revoked sooner.       Influenza A by PCR NEGATIVE NEGATIVE   Influenza B by PCR NEGATIVE NEGATIVE    Comment: (NOTE) The Xpert Xpress SARS-CoV-2/FLU/RSV plus assay is intended as an aid in the diagnosis of influenza from Nasopharyngeal swab specimens and should not be used as a sole basis for treatment. Nasal washings and aspirates are unacceptable for Xpert Xpress SARS-CoV-2/FLU/RSV testing.  Fact Sheet for Patients: BloggerCourse.com  Fact Sheet for Healthcare Providers: SeriousBroker.it  This test is not yet approved or cleared by the [place] FDA and has been authorized for detection and/or diagnosis of SARS-CoV-2 by FDA under an Emergency Use Authorization (EUA).  This EUA will remain in effect (meaning this test can be used) for the duration of the COVID-19 declaration under Section 564(b)(1) of the Act, 21 U.S.C. section 360bbb-3(b)(1), unless the authorization is terminated or revoked.     Resp Syncytial Virus by PCR NEGATIVE NEGATIVE    Comment: (NOTE) Fact Sheet for Patients: BloggerCourse.com  Fact Sheet for Healthcare Providers: SeriousBroker.it  This test is not yet approved or cleared by the [place] FDA and has been authorized for detection and/or diagnosis of SARS-CoV-2 by FDA under an Emergency Use Authorization (EUA). This EUA will remain in effect (meaning this test can be used) for the duration of the COVID-19 declaration under Section 564(b)(1) of the Act, 21 U.S.C. section 360bbb-3(b)(1), unless the authorization is terminated or revoked.  Performed at [facility], [address] [place], [place] [address]   CBC     Status: Abnormal   Collection Time: 06/25/23  4:45 AM  Result Value Ref Range   WBC 7.2 4.0 - 10.5 K/uL   RBC 3.64 (L) 3.87 - 5.11 MIL/uL   Hemoglobin 10.9 (L) 12.0 - 15.0 g/dL   HCT 95.6 (L) 21.3 - 08.6 %   MCV 96.7 80.0 - 100.0 fL   MCH 29.9 26.0 - 34.0 pg   MCHC 31.0 30.0 - 36.0 g/dL   RDW 57.8 46.9 - 62.9 %   Platelets 225 150 - 400 K/uL   nRBC 0.0 0.0 - 0.2 %    Comment: Performed at [facility], [address] [place], [place] [address]  Basic metabolic panel     Status: Abnormal   Collection Time: 06/25/23  4:45 AM  Result Value Ref Range   Sodium 140 135 - 145 mmol/L   Potassium 4.1 3.5 - 5.1 mmol/L   Chloride 98 98 - 111 mmol/L   CO2 33 (H) 22 - 32 mmol/L   Glucose, Bld 119 (H) 70 - 99 mg/dL  Comment: Glucose reference range applies only to samples taken after fasting for at least 8 hours.   BUN 20 8 - 23 mg/dL   Creatinine, Ser 1.61 (L) 0.44 - 1.00 mg/dL   Calcium 8.5 (L) 8.9 - 10.3 mg/dL    GFR, Estimated >09 >60 mL/min    Comment: (NOTE) Calculated using the CKD-EPI Creatinine Equation (2021)    Anion gap 9 5 - 15    Comment: Performed at [facility], [address] [place], [place] [address]  Urinalysis, Routine w reflex microscopic -Urine, Clean Catch     Status: Abnormal   Collection Time: 06/25/23  5:36 AM  Result Value Ref Range   Color, Urine YELLOW YELLOW   APPearance CLEAR CLEAR   Specific Gravity, Urine 1.023 1.005 - 1.030   pH 6.0 5.0 - 8.0   Glucose, UA 50 (A) NEGATIVE mg/dL   Hgb urine dipstick SMALL (A) NEGATIVE   Bilirubin Urine NEGATIVE NEGATIVE   Ketones, ur NEGATIVE NEGATIVE mg/dL   Protein, ur NEGATIVE NEGATIVE mg/dL   Nitrite NEGATIVE NEGATIVE   Leukocytes,Ua NEGATIVE NEGATIVE   RBC / HPF 21-50 0 - 5 RBC/hpf   WBC, UA 0-5 0 - 5 WBC/hpf   Bacteria, UA NONE SEEN NONE SEEN   Squamous Epithelial / HPF 0-5 0 - 5 /HPF    Comment: Performed at [facility], [address], [place], [place] [address]  D-dimer, quantitative     Status: None   Collection Time: 06/25/23  8:35 AM  Result Value Ref Range   D-Dimer, Quant 0.45 0.00 - 0.50 ug/mL-FEU    Comment: (NOTE) At the manufacturer cut-off value of 0.5 g/mL FEU, this assay has a negative predictive value of 95-100%.This assay is intended for use in conjunction with a clinical pretest probability (PTP) assessment model to exclude pulmonary embolism (PE) and deep venous thrombosis (DVT) in outpatients suspected of PE or DVT. Results should be correlated with clinical presentation. Performed at [facility], [address], [place], [place] [address]    CT Renal Stone Study  Result Date: 06/24/2023 CLINICAL DATA:  Flank and abdominal pain EXAM: CT ABDOMEN AND PELVIS WITHOUT CONTRAST TECHNIQUE: Multidetector CT imaging of the abdomen and pelvis was performed following the standard protocol without IV contrast. RADIATION DOSE REDUCTION: This  exam was performed according to the departmental dose-optimization program which includes automated exposure control, adjustment of the mA and/or kV according to patient size and/or use of iterative reconstruction technique. COMPARISON:  CT angiogram chest abdomen and pelvis 03/30/2022. FINDINGS: Lower chest: Minimal patchy airspace opacities are seen in the left lung base with some peripheral mucous plugging. Mild emphysema present. Hepatobiliary: No focal liver abnormality is seen. No gallstones, gallbladder wall thickening, or biliary dilatation. Pancreas: Unremarkable. No pancreatic ductal dilatation or surrounding inflammatory changes. Spleen: Normal in size without focal abnormality. Adrenals/Urinary Tract: There are punctate nonobstructing calculi in the lower pole the right kidney. There is a punctate calculus in the posterior aspect of the right bladder. There is trace right-sided hydronephrosis. The left kidney and adrenal glands are within normal limits. Stomach/Bowel: Stomach is within normal limits. No evidence of bowel wall thickening, distention, or inflammatory changes. The appendix is not seen. Vascular/Lymphatic: Aortic atherosclerosis. No enlarged abdominal or pelvic lymph nodes. Reproductive: Status post hysterectomy. No adnexal masses. Other: No abdominal wall hernia or abnormality. No abdominopelvic ascites. Musculoskeletal: No acute osseous findings. Stable grade 1 anterolisthesis at L4-L5. IMPRESSION: 1. Punctate calculus in the posterior  aspect of the right bladder with trace right-sided hydronephrosis. Findings may represent recently passed stone. 2. Additional punctate nonobstructing calculi in the right kidney. 3. Minimal patchy airspace opacities in the left lung base with some peripheral mucous plugging. Findings may represent infection or aspiration. Aortic Atherosclerosis (ICD10-I70.0). Electronically Signed   By: Darliss Cheney M.D.   On: 06/24/2023 21:57   DG Chest Port 1  View  Result Date: 06/24/2023 CLINICAL DATA:  Shortness of breath lung cancer EXAM: PORTABLE CHEST 1 VIEW COMPARISON:  06/19/2023, CT 06/05/2023, 03/29/2022 FINDINGS: Volume loss and postsurgical changes on the left with left apical pleural and parenchymal scarring. Tracheal deviation to the left as before. Stable cardiac size. No acute airspace disease. Emphysema IMPRESSION: No active disease. Postsurgical changes on the left.  Emphysema Electronically Signed   By: Jasmine Pang M.D.   On: 06/24/2023 20:33    Pending Labs Unresulted Labs (From admission, onward)    None       Vitals/Pain Today's Vitals   06/25/23 0539 06/25/23 0730 06/25/23 0742 06/25/23 0744  BP:  118/80    Pulse:  83    Resp:  (!) 28    Temp: 98 F (36.7 C)  97.7 F (36.5 C)   TempSrc:   Oral   SpO2:  95%  97%  PainSc:        Isolation Precautions No active isolations  Medications Medications  budesonide (PULMICORT) nebulizer solution 0.25 mg (0.25 mg Nebulization Given 06/25/23 0742)  arformoterol (BROVANA) nebulizer solution 15 mcg (15 mcg Nebulization Given 06/25/23 0742)  ipratropium-albuterol (DUONEB) 0.5-2.5 (3) MG/3ML nebulizer solution 3 mL (has no administration in time range)  methylPREDNISolone sodium succinate (SOLU-MEDROL) 40 mg/mL injection 40 mg (40 mg Intravenous Given 06/24/23 2319)  sodium chloride flush (NS) 0.9 % injection 3 mL (3 mLs Intravenous Given 06/25/23 0014)  acetaminophen (TYLENOL) tablet 1,000 mg (has no administration in time range)    Or  acetaminophen (TYLENOL) suppository 650 mg (has no administration in time range)  ondansetron (ZOFRAN) tablet 4 mg (has no administration in time range)    Or  ondansetron (ZOFRAN) injection 4 mg (has no administration in time range)  methocarbamol (ROBAXIN) tablet 500 mg (500 mg Oral Given 06/25/23 0527)  ALPRAZolam (XANAX) tablet 0.5 mg (0.5 mg Oral Given 06/25/23 0526)  apixaban (ELIQUIS) tablet 5 mg (5 mg Oral Given 06/25/23 0012)   doxycycline (VIBRA-TABS) tablet 100 mg (100 mg Oral Given 06/25/23 0012)  guaiFENesin (MUCINEX) 12 hr tablet 1,200 mg (1,200 mg Oral Given 06/25/23 0012)  levothyroxine (SYNTHROID) tablet 125 mcg (125 mcg Oral Given 06/25/23 0524)  metoprolol succinate (TOPROL-XL) 24 hr tablet 12.5 mg (has no administration in time range)  rosuvastatin (CRESTOR) tablet 20 mg (has no administration in time range)  sertraline (ZOLOFT) tablet 25 mg (has no administration in time range)  albuterol (PROVENTIL) (2.5 MG/3ML) 0.083% nebulizer solution (15 mg/hr Nebulization Given 06/24/23 1829)  ipratropium (ATROVENT) nebulizer solution 1 mg (1 mg Nebulization Given 06/24/23 1831)  morphine (PF) 4 MG/ML injection 4 mg (4 mg Intravenous Given 06/24/23 1848)    Mobility non-ambulatory     Focused Assessments Shortness of breath   R Recommendations: See Admitting Provider Note  Report given to:   Additional Notes: .

## 2023-06-25 NOTE — Assessment & Plan Note (Signed)
BP normal - Continue metoprolol - Hold losartan

## 2023-06-25 NOTE — Assessment & Plan Note (Signed)
I suspect this is due to excessive coughing.  No improvement with Tussionex.   - Continue Robaxin, Tussionex - Ibuprofen - Heat pad - CXR nondiagnostic: will obtain CT chest with contrast given prior history of lung cancer

## 2023-06-25 NOTE — Assessment & Plan Note (Addendum)
Slightly tachycardic - Continue metoprolol, ELiquis

## 2023-06-25 NOTE — Assessment & Plan Note (Signed)
-   Continue sertraline, Xanax

## 2023-06-25 NOTE — Hospital Course (Addendum)
Tracy Park is a 67 y.o. F with COPD and chronic hypoxic respiratory failure on 2 L O2 via [place] at baseline, PAF on Eliquis, lung cancer s/p left upper lobectomy, HTN, HLD, hypothyroidism, depression who is admitted with left sided chest and rib pain with coughing after recent admission for COPD.  CT abdomen and pelvis without contrast in the ER showed no acute findings. Admitted for further work up.

## 2023-06-25 NOTE — Progress Notes (Signed)
Progress Note   Patient: Tracy Park ZOX:[id number] DOB: 1955-12-30 DOA: 06/24/2023     0 DOS: the patient was seen and examined on 06/25/2023        Brief hospital course: Mrs. Gangl is a 67 y.o. F with COPD and chronic hypoxic respiratory failure on 2 L O2 via [place] at baseline, PAF on Eliquis, lung cancer s/p left upper lobectomy, HTN, HLD, hypothyroidism, depression who is admitted with left sided chest and rib pain with coughing after recent admission for COPD.  CT abdomen and pelvis without contrast in the ER showed no acute findings. Admitted for further work up.     Assessment and Plan: * Rib pain on left side I suspect this is due to excessive coughing.  No improvement with Tussionex.   - Continue Robaxin, Tussionex - Ibuprofen - Heat pad - CXR nondiagnostic: will obtain CT chest with contrast given prior history of lung cancer    History of lung cancer    Acquired hypothyroidism - Continue levothyroxine  Paroxysmal atrial fibrillation (HCC) Slightly tachycardic - Continue metoprolol, ELiquis  Essential hypertension BP normal - Continue metoprolol - Hold losartan  Anxiety and depression - Continue sertraline, Xanax  Dyslipidemia - Continue Crestor  Chronic hypoxic respiratory failure (HCC)    COPD (chronic obstructive pulmonary disease) with chronic bronchitis (HCC) Chronic respiratory failure Blood gas on admission showed normal pH, pCO2 60s SHe is getting over her previous COPD flare, went home and smoked, and has persistent coughing. - Continue home O2, goal SpO2 88-94% only - Continue LABA/ICS - Continue steroids, doxycycline - Avoid tobacco            Subjective: Patient still has a lot of pain.,  She is coughing frequently.  She has no vomiting, no confusion, no fever.     Physical Exam: BP 126/82 (BP Location: Right Arm)   Pulse 83   Temp 98.9 F (37.2 C) (Oral)   Resp (!) 22   SpO2 93%   Frail elderly female,  sitting up in bed, tired, coughing frequently Tachycardic, irregular, no murmurs, no peripheral edema Respiratory rate elevated, appears short of breath with exertion Attention normal, affect anxious, judgment insight appear normal  Data Reviewed: Basic metabolic panel normal CBC shows mild anemia Renal CT shows possible evidence of recently passed right ureteral stone   Family Communication: None present    Disposition: Status is: Observation Patient refusing discharge due to pain.        Author: Alberteen Sam, MD 06/25/2023 3:53 PM  For on call review [email or web address].

## 2023-06-25 NOTE — Assessment & Plan Note (Signed)
Chronic respiratory failure Blood gas on admission showed normal pH, pCO2 60s SHe is getting over her previous COPD flare, went home and smoked, and has persistent coughing. - Continue home O2, goal SpO2 88-94% only - Continue LABA/ICS - Continue steroids, doxycycline - Avoid tobacco

## 2023-06-25 NOTE — Assessment & Plan Note (Signed)
Continue levothyroxine 

## 2023-06-26 ENCOUNTER — Other Ambulatory Visit (HOSPITAL_COMMUNITY): Payer: Self-pay

## 2023-06-26 DIAGNOSIS — R0781 Pleurodynia: Secondary | ICD-10-CM | POA: Diagnosis not present

## 2023-06-26 MED ORDER — OXYCODONE HCL 5 MG PO TABS
5.0000 mg | ORAL_TABLET | Freq: Once | ORAL | Status: AC
  Administered 2023-06-26: 5 mg via ORAL
  Filled 2023-06-26: qty 1

## 2023-06-26 MED ORDER — IBUPROFEN 200 MG PO TABS
600.0000 mg | ORAL_TABLET | Freq: Three times a day (TID) | ORAL | Status: AC
Start: 2023-06-26 — End: 2023-06-29

## 2023-06-26 MED ORDER — ACETAMINOPHEN 500 MG PO TABS: 1000.0000 mg | ORAL_TABLET | Freq: Three times a day (TID) | ORAL | Status: AC

## 2023-06-26 MED ORDER — BENZONATATE 100 MG PO CAPS
100.0000 mg | ORAL_CAPSULE | Freq: Four times a day (QID) | ORAL | 0 refills | Status: DC | PRN
  Filled 2023-06-26: qty 30, 8d supply, fill #0

## 2023-06-26 MED ORDER — ALBUTEROL SULFATE (2.5 MG/3ML) 0.083% IN NEBU
INHALATION_SOLUTION | RESPIRATORY_TRACT | 0 refills | Status: DC
  Filled 2023-06-26: qty 90, 30d supply, fill #0

## 2023-06-26 MED ORDER — LEVOFLOXACIN 750 MG PO TABS
750.0000 mg | ORAL_TABLET | Freq: Every day | ORAL | 0 refills | Status: AC
  Filled 2023-06-26: qty 5, 5d supply, fill #0

## 2023-06-26 MED ORDER — DOXYCYCLINE HYCLATE 100 MG PO CAPS
100.0000 mg | ORAL_CAPSULE | Freq: Two times a day (BID) | ORAL | 0 refills | Status: DC
  Filled 2023-06-26: qty 10, 5d supply, fill #0

## 2023-06-26 MED ORDER — PREDNISONE 10 MG PO TABS
ORAL_TABLET | ORAL | 0 refills | Status: DC
  Filled 2023-06-26: qty 22, 10d supply, fill #0

## 2023-06-26 MED ORDER — OXYCODONE HCL 5 MG PO TABS
5.0000 mg | ORAL_TABLET | Freq: Three times a day (TID) | ORAL | 0 refills | Status: DC | PRN
Start: 2023-06-26 — End: 2023-08-09
  Filled 2023-06-26: qty 12, 4d supply, fill #0

## 2023-06-26 MED ORDER — GUAIFENESIN ER 600 MG PO TB12
1200.0000 mg | ORAL_TABLET | Freq: Two times a day (BID) | ORAL | 0 refills | Status: AC
Start: 2023-06-26 — End: 2023-07-01
  Filled 2023-06-26: qty 20, 5d supply, fill #0

## 2023-06-26 NOTE — Discharge Summary (Addendum)
Physician Discharge Summary   Patient: Tracy Park MRN: [id number] DOB: [birth year]  Admit date:     06/24/2023  Discharge date: 06/26/23  Discharge Physician: Alberteen Sam   PCP: Jacquelin Hawking, PA-C     Recommendations at discharge:  Follow up with PCP Karrie Meres in 1 week for Asthma flare      Discharge Diagnoses: Principal Problem:   Rib pain on left side Active Problems:   Acquired hypothyroidism   History of lung cancer   COPD (chronic obstructive pulmonary disease) with chronic bronchitis (HCC)   Chronic hypoxic respiratory failure (HCC)   Dyslipidemia   Anxiety and depression   Essential hypertension   Paroxysmal atrial fibrillation [facility])      Hospital Course: Tracy Park is a 67 y.o. F with COPD and chronic hypoxic respiratory failure on 2 L O2 via [place] at baseline, PAF on Eliquis, lung cancer s/p left upper lobectomy, HTN, HLD, hypothyroidism, depression who is admitted with left sided chest and rib pain with coughing after recent admission for COPD.  CT abdomen and pelvis without contrast in the ER showed no acute findings. Admitted for further work up.      * Rib pain on left side Dimer ruled out PE.  ECG and troponins ruled out infarction.  CT chest abdomen and pelvis due to severe pain obtained, no acute findings to account for pain (no new mass, hematoma, infection, fracture, effusion or pneumothorax, organ rupture or other).  Her pain was consistent with rib pain from excessive coughing and responded to analgesics.  Recommend acetaminophen, NSAIDs at discharge.  Small # opiates given for severe pain and cough suppression.    COPD (chronic obstructive pulmonary disease) with chronic bronchitis (HCC) Chronic respiratory failure Acute respiratory failure ruled out.  COPD exacerbation from prior admission is RESOLVING, this was not a readmission for worsening of respiratory signs or symptoms.    CT chest ruled out pneumonia, but  given noted pseudomonas colonization on last two sputum cultures, she was discharged with Levaquin instead of doxycycline.  She ambulated on home O2 and remained >90%.  She noted to pharmacy tech that she had not picked up her prescriptions after her last discharge, and so these were sent to [facility] and delivered to room prior to d/c.                 [facility] Controlled Substances Registry was reviewed for this patient prior to discharge.   Consultants: None Procedures performed: CT chest abdomen and pelvis  Disposition: Home health Diet recommendation:  Discharge Diet Orders (From admission, onward)     Start     Ordered   06/26/23 0000  Diet - low sodium heart healthy        06/26/23 0940             DISCHARGE MEDICATION: Allergies as of 06/26/2023       Reactions   Red Dye #40 (allura Red) Hives, Itching, Other (See Comments)   Red food dye   Strawberry Extract Hives, Itching   Tomato Hives, Itching   Aspirin Hives   Tape Rash, Other (See Comments)   Prefers paper tape   Wound Dressing Adhesive Rash        Medication List     STOP taking these medications    doxycycline 100 MG capsule Commonly known as: VIBRAMYCIN       TAKE these medications    albuterol 108 (90 Base) MCG/ACT inhaler  Commonly known as: VENTOLIN HFA Inhale 2 puffs into the lungs every 6 (six) hours as needed for wheezing or shortness of breath. What changed: when to take this   albuterol (2.5 MG/3ML) 0.083% nebulizer solution Commonly known as: PROVENTIL Take 3 mLs (2.5 mg total) by nebulization every 4 (four) hours while awake for 3 days, THEN 3 mLs (2.5 mg total) every 4 (four) hours as needed for wheezing or shortness of breath. Start taking on: June 26, 2023 What changed: See the new instructions.   ALPRAZolam 0.5 MG tablet Commonly known as: XANAX Take 0.5 mg by mouth in the morning and at bedtime.   benzonatate 100 MG capsule Commonly  known as: Tessalon Perles Take 1 capsule (100 mg total) by mouth every 6 (six) hours as needed for cough.   Breztri Aerosphere 160-9-4.8 MCG/ACT Aero Generic drug: Budeson-Glycopyrrol-Formoterol Inhale 1 puff into the lungs in the morning and at bedtime.   Eliquis 5 MG Tabs tablet Generic drug: apixaban Take 1 tablet (5 mg total) by mouth 2 (two) times daily.   guaiFENesin 600 MG 12 hr tablet Commonly known as: MUCINEX Take 2 tablets (1,200 mg total) by mouth 2 (two) times daily for 3 days.   ibuprofen 200 MG tablet Commonly known as: ADVIL Take 3 tablets (600 mg total) by mouth 3 (three) times daily for 3 days.   levofloxacin 750 MG tablet Commonly known as: Levaquin Take 1 tablet (750 mg total) by mouth daily for 5 days.   levothyroxine 125 MCG tablet Commonly known as: SYNTHROID Take 125 mcg by mouth daily before breakfast.   losartan 25 MG tablet Commonly known as: COZAAR Take 12.5 mg by mouth daily.   magnesium oxide 400 (240 Mg) MG tablet Commonly known as: MAG-OX Take 400 mg by mouth in the morning.   metoprolol succinate 25 MG 24 hr tablet Commonly known as: TOPROL-XL Take 0.5 tablets (12.5 mg total) by mouth daily.   nitroGLYCERIN 0.4 MG SL tablet Commonly known as: NITROSTAT Place 0.4 mg under the tongue every 5 (five) minutes as needed for chest pain.   oxyCODONE 5 MG immediate release tablet Commonly known as: Roxicodone Take 1 tablet (5 mg total) by mouth every 8 (eight) hours as needed for severe pain (pain score 7-10).   OXYGEN Inhale 2 L/min into the lungs continuous.   predniSONE 10 MG tablet Commonly known as: DELTASONE Take 40 mg (4 tabs) daily for 1 more day then take 30 mg (3 tabs) daily for 3 days then take 20 mg (2 tabs) daily for 3 days then take 10 mg (1 tab) daily for three days then stop2 What changed:  how much to take how to take this when to take this additional instructions   rosuvastatin 20 MG tablet Commonly known as:  CRESTOR Take 20 mg by mouth daily.   sertraline 25 MG tablet Commonly known as: ZOLOFT Take 25 mg by mouth in the morning.   Systane Complete PF 0.6 % Soln Generic drug: Propylene Glycol (PF) Place 1 drop into both eyes 3 (three) times daily as needed (for dryness).   Tylenol 8 Hour 650 MG CR tablet Generic drug: acetaminophen Take 650-1,300 mg by mouth 2 (two) times daily as needed for pain. What changed: Another medication with the same name was added. Make sure you understand how and when to take each.   acetaminophen 500 MG tablet Commonly known as: TYLENOL Take 2 tablets (1,000 mg total) by mouth in the morning, at noon, and  at bedtime for 7 days. What changed: You were already taking a medication with the same name, and this prescription was added. Make sure you understand how and when to take each.        Follow-up Information     Jacquelin Hawking, PA-C. Schedule an appointment as soon as possible for a visit in 1 week(s).   Specialty: Physician Assistant Contact information: [address] [place] [address] [phone]                 Discharge Instructions     Diet - low sodium heart healthy   Complete by: As directed    Discharge instructions   Complete by: As directed    **IMPORTANT DISCHARGE INSTRUCTIONS**   From Dr. Maryfrances Bunnell: You were admitted for rib pain You had a CT chest, CT abdomen that showed no concerning findings in the left chest or abdomen, no mass, no bleeding, no infection, no organ rupture, no free fluid, no fractures.  Blood work ruled out heart attack or blood clot in the chest.  Finish the prednisone you were given: Take prednisone 40 mg tomorrow Then take prednisone 30 mg (3 tabs) for three days (Sat, Sun, Mon) then  Take prednisone 20 mg (2 tabs) daily for three days next week and finally Take prednisone 10 mg (1 tab) daily for three days  Obtain and take the antibiotic levofloxacin/Levaquin 750 mg daily  for five days (starting tonight) (You can disregard the doxycycline prescription that was sent to your pharmacy)  You have rib pain from coughing. I recommend you take acetaminophen 1000 mg (two tabs) three times daily for the next week  Take robitussin and Tessalon perles alternating for cough suppressions  Use your albuterol 3 times per day  Use the guaifenesin for clearing mucus  For pain, in addition you should take ibuprofen 600 mg (three tabs) three times daily for the next 3-5 days then stop  Go see your primary doctor within 1 week and get labs checked  If you have pain not relieved with the above medicines, you may also take oxycodone 5 mg up to three times per day Do NOT take with Xanax   Increase activity slowly   Complete by: As directed        Discharge Exam: Filed Weights   06/26/23 0809  Weight: 49.3 kg    General: Pt is alert, awake, not in acute distress, eating breakfast Cardiovascular: RRR, nl S1-S2, no murmurs appreciated.   No LE edema.   Respiratory: Normal respiratory rate and rhythm.  No rales.  Prolonged expiratory phase, coarse low wheeze with expiration.   Abdominal: Abdomen soft and non-tender.  No distension or HSM.   Neuro/Psych: Strength symmetric in upper and lower extremities.  Judgment and insight appear normal.   Condition at discharge: fair  The results of significant diagnostics from this hospitalization (including imaging, microbiology, ancillary and laboratory) are listed below for reference.   Imaging Studies: CT CHEST W CONTRAST  Result Date: 06/25/2023 CLINICAL DATA:  Respiratory illness EXAM: CT CHEST WITH CONTRAST TECHNIQUE: Multidetector CT imaging of the chest was performed during intravenous contrast administration. RADIATION DOSE REDUCTION: This exam was performed according to the departmental dose-optimization program which includes automated exposure control, adjustment of the mA and/or kV according to patient size and/or  use of iterative reconstruction technique. CONTRAST:  75mL OMNIPAQUE IOHEXOL 300 MG/ML  SOLN COMPARISON:  CT chest 06/05/2023 FINDINGS: Cardiovascular: No significant vascular findings. Normal heart size.  No pericardial effusion. There is atherosclerotic calcification of the aorta. Mediastinum/Nodes: No enlarged mediastinal, hilar, or axillary lymph nodes. Thyroid gland, trachea, and esophagus demonstrate no significant findings. Lungs/Pleura: Again seen are changes of left upper lobectomy. Stable moderate emphysema present. Stable pleural-parenchymal scarring in the left lung apex. There are scattered tree-in-bud opacities and peribronchial wall thickening in the left lower lobe, similar to the prior study. No pleural effusion or pneumothorax. Stable right upper lobe 4 mm nodular density image 5/57. Stable small amount of parenchymal patchy airspace disease in the posterior right upper lobe. Upper Abdomen: No acute abnormality. Musculoskeletal: No chest wall abnormality. No acute or significant osseous findings. IMPRESSION: 1. Stable changes of left upper lobectomy. 2. Stable tree-in-bud opacities and peribronchial wall thickening in the left lower lobe, likely infectious/inflammatory. 3. Stable small amount of parenchymal patchy airspace disease in the posterior right upper lobe. 4. Stable 4 mm right upper lobe pulmonary nodule. Aortic Atherosclerosis (ICD10-I70.0). Electronically Signed   By: Darliss Cheney M.D.   On: 06/25/2023 20:54   CT Renal Stone Study  Result Date: 06/24/2023 CLINICAL DATA:  Flank and abdominal pain EXAM: CT ABDOMEN AND PELVIS WITHOUT CONTRAST TECHNIQUE: Multidetector CT imaging of the abdomen and pelvis was performed following the standard protocol without IV contrast. RADIATION DOSE REDUCTION: This exam was performed according to the departmental dose-optimization program which includes automated exposure control, adjustment of the mA and/or kV according to patient size and/or use of  iterative reconstruction technique. COMPARISON:  CT angiogram chest abdomen and pelvis 03/30/2022. FINDINGS: Lower chest: Minimal patchy airspace opacities are seen in the left lung base with some peripheral mucous plugging. Mild emphysema present. Hepatobiliary: No focal liver abnormality is seen. No gallstones, gallbladder wall thickening, or biliary dilatation. Pancreas: Unremarkable. No pancreatic ductal dilatation or surrounding inflammatory changes. Spleen: Normal in size without focal abnormality. Adrenals/Urinary Tract: There are punctate nonobstructing calculi in the lower pole the right kidney. There is a punctate calculus in the posterior aspect of the right bladder. There is trace right-sided hydronephrosis. The left kidney and adrenal glands are within normal limits. Stomach/Bowel: Stomach is within normal limits. No evidence of bowel wall thickening, distention, or inflammatory changes. The appendix is not seen. Vascular/Lymphatic: Aortic atherosclerosis. No enlarged abdominal or pelvic lymph nodes. Reproductive: Status post hysterectomy. No adnexal masses. Other: No abdominal wall hernia or abnormality. No abdominopelvic ascites. Musculoskeletal: No acute osseous findings. Stable grade 1 anterolisthesis at L4-L5. IMPRESSION: 1. Punctate calculus in the posterior aspect of the right bladder with trace right-sided hydronephrosis. Findings may represent recently passed stone. 2. Additional punctate nonobstructing calculi in the right kidney. 3. Minimal patchy airspace opacities in the left lung base with some peripheral mucous plugging. Findings may represent infection or aspiration. Aortic Atherosclerosis (ICD10-I70.0). Electronically Signed   By: Darliss Cheney M.D.   On: 06/24/2023 21:57   DG Chest Port 1 View  Result Date: 06/24/2023 CLINICAL DATA:  Shortness of breath lung cancer EXAM: PORTABLE CHEST 1 VIEW COMPARISON:  06/19/2023, CT 06/05/2023, 03/29/2022 FINDINGS: Volume loss and postsurgical  changes on the left with left apical pleural and parenchymal scarring. Tracheal deviation to the left as before. Stable cardiac size. No acute airspace disease. Emphysema IMPRESSION: No active disease. Postsurgical changes on the left.  Emphysema Electronically Signed   By: Jasmine Pang M.D.   On: 06/24/2023 20:33   DG Chest Portable 1 View  Result Date: 06/19/2023 CLINICAL DATA:  Shortness of breath EXAM: PORTABLE CHEST 1 VIEW COMPARISON:  Radiograph 06/04/2023  and CT chest 06/05/2023 FINDINGS: Postsurgical changes from left upper lobectomy with left apex opacification and leftward deviation of the superior mediastinum and trachea. No focal consolidation, pleural effusion, or pneumothorax. Chronic blunting of the left costophrenic angle. Hyperinflation and chronic bronchitic change. Stable cardiomediastinal silhouette. IMPRESSION: No acute cardiopulmonary process.  Emphysema.  Left upper lobectomy. Electronically Signed   By: Minerva Fester M.D.   On: 06/19/2023 19:06   CT CHEST WO CONTRAST  Result Date: 06/05/2023 CLINICAL DATA:  Lung nodule. EXAM: CT CHEST WITHOUT CONTRAST TECHNIQUE: Multidetector CT imaging of the chest was performed following the standard protocol without IV contrast. RADIATION DOSE REDUCTION: This exam was performed according to the departmental dose-optimization program which includes automated exposure control, adjustment of the mA and/or kV according to patient size and/or use of iterative reconstruction technique. COMPARISON:  03/08/2023. FINDINGS: Cardiovascular: The heart is normal in size and there is a small pericardial effusion. Multi-vessel coronary artery calcifications are noted. There is atherosclerotic calcification of the aorta without evidence of aneurysm. The pulmonary trunk is normal in caliber. Mediastinum/Nodes: There is reduced lung volume on the left with mediastinal shift to the left. No mediastinal or axillary lymphadenopathy by size criteria. Evaluation of  the hila is limited due to lack of IV contrast. The trachea and esophagus are within normal limits. Lungs/Pleura: Centrilobular and paraseptal emphysematous changes are present in the lungs. The patient is status post left upper lobectomy with stable parenchymal scarring in the right upper lobe bronchiectasis and bronchial wall thickening is noted on the left. No effusion or pneumothorax. Stable opacities are noted in the posterior aspect of the right upper lobe. The previously described 1.1 cm left lower lobe nodule is no longer seen. A stable 4 mm nodule is present in the right upper lobe, axial image 63 and is unchanged from 2021 and likely benign. No new nodule is seen. Upper Abdomen: No acute abnormality. Musculoskeletal: Cervical spinal fusion hardware is noted. No acute or suspicious osseous abnormality. IMPRESSION: 1. Interval resolution of left lower lobe pulmonary nodule. No new nodule is seen. 2. Stable postsurgical changes of left upper lobectomy. 3. Emphysema. 4. Multi-vessel coronary artery calcifications. 5. Aortic atherosclerosis. Electronically Signed   By: Thornell Sartorius M.D.   On: 06/05/2023 21:53   DG Chest 2 View  Result Date: 06/04/2023 CLINICAL DATA:  COPD. Shortness of breath. Chest tightness. Cough. EXAM: CHEST - 2 VIEW COMPARISON:  04/28/2023. FINDINGS: Redemonstration of postsurgical changes from left upper lobectomy characterized by left lung apex opacification with associated linear areas of fibrosis/scarring and upward pulling of the left hilum, essentially unchanged since the prior study. Bilateral lungs are otherwise grossly clear. No acute consolidation or major lung collapse. Bilateral costophrenic angles are clear. Stable cardio-mediastinal silhouette. No acute osseous abnormalities. Lower cervical spinal fixation hardware is again seen. The soft tissues are within normal limits. IMPRESSION: *No acute cardiopulmonary process. Chronic changes of left upper lobectomy.  Electronically Signed   By: Jules Schick M.D.   On: 06/04/2023 15:29    Microbiology: Results for orders placed or performed during the hospital encounter of 06/24/23  Resp panel by RT-PCR (RSV, Flu A&B, Covid) Anterior Nasal Swab     Status: None   Collection Time: 06/24/23  6:53 PM   Specimen: Anterior Nasal Swab  Result Value Ref Range Status   SARS Coronavirus 2 by RT PCR NEGATIVE NEGATIVE Final    Comment: (NOTE) SARS-CoV-2 target nucleic acids are NOT DETECTED.  The SARS-CoV-2 RNA is generally detectable  in upper respiratory specimens during the acute phase of infection. The lowest concentration of SARS-CoV-2 viral copies this assay can detect is 138 copies/mL. A negative result does not preclude SARS-Cov-2 infection and should not be used as the sole basis for treatment or other patient management decisions. A negative result may occur with  improper specimen collection/handling, submission of specimen other than nasopharyngeal swab, presence of viral mutation(s) within the areas targeted by this assay, and inadequate number of viral copies(<138 copies/mL). A negative result must be combined with clinical observations, patient history, and epidemiological information. The expected result is Negative.  Fact Sheet for Patients:  BloggerCourse.com  Fact Sheet for Healthcare Providers:  SeriousBroker.it  This test is no t yet approved or cleared by the [place] FDA and  has been authorized for detection and/or diagnosis of SARS-CoV-2 by FDA under an Emergency Use Authorization (EUA). This EUA will remain  in effect (meaning this test can be used) for the duration of the COVID-19 declaration under Section 564(b)(1) of the Act, 21 U.S.C.section 360bbb-3(b)(1), unless the authorization is terminated  or revoked sooner.       Influenza A by PCR NEGATIVE NEGATIVE Final   Influenza B by PCR NEGATIVE NEGATIVE Final     Comment: (NOTE) The Xpert Xpress SARS-CoV-2/FLU/RSV plus assay is intended as an aid in the diagnosis of influenza from Nasopharyngeal swab specimens and should not be used as a sole basis for treatment. Nasal washings and aspirates are unacceptable for Xpert Xpress SARS-CoV-2/FLU/RSV testing.  Fact Sheet for Patients: BloggerCourse.com  Fact Sheet for Healthcare Providers: SeriousBroker.it  This test is not yet approved or cleared by the [place] FDA and has been authorized for detection and/or diagnosis of SARS-CoV-2 by FDA under an Emergency Use Authorization (EUA). This EUA will remain in effect (meaning this test can be used) for the duration of the COVID-19 declaration under Section 564(b)(1) of the Act, 21 U.S.C. section 360bbb-3(b)(1), unless the authorization is terminated or revoked.     Resp Syncytial Virus by PCR NEGATIVE NEGATIVE Final    Comment: (NOTE) Fact Sheet for Patients: BloggerCourse.com  Fact Sheet for Healthcare Providers: SeriousBroker.it  This test is not yet approved or cleared by the [place] FDA and has been authorized for detection and/or diagnosis of SARS-CoV-2 by FDA under an Emergency Use Authorization (EUA). This EUA will remain in effect (meaning this test can be used) for the duration of the COVID-19 declaration under Section 564(b)(1) of the Act, 21 U.S.C. section 360bbb-3(b)(1), unless the authorization is terminated or revoked.  Performed at [facility], [address], [place], [place] [address]     Labs: CBC: Recent Labs  Lab 06/20/23 0339 06/24/23 1818 06/25/23 0445  WBC 12.6* 13.3* 7.2  NEUTROABS  --  12.3*  --   HGB 11.9* 12.1 10.9*  HCT 39.5 40.1 35.2*  MCV 97.8 96.4 96.7  PLT 222 245 225   Basic Metabolic Panel: Recent Labs  Lab 06/20/23 0339 06/24/23 1818 06/25/23 0445  NA 137 140  140  K 4.7 3.6 4.1  CL 98 98 98  CO2 30 32 33*  GLUCOSE 137* 158* 119*  BUN 19 18 20   CREATININE 0.51 0.48 0.41*  CALCIUM 8.8* 8.5* 8.5*   Liver Function Tests: Recent Labs  Lab 06/24/23 1818  AST 15  ALT 17  ALKPHOS 61  BILITOT 0.4  PROT 6.6  ALBUMIN 3.3*   CBG: No results for input(s): "GLUCAP" in the last 168 hours.  Discharge time spent: approximately 35 minutes spent on discharge counseling, evaluation of patient on day of discharge, and coordination of discharge planning with nursing, social work, pharmacy and case management  Signed: Alberteen Sam, MD [facility] 06/26/2023

## 2023-06-26 NOTE — Progress Notes (Signed)
Attempt x 2 to contact pt's daughter on # listed on chart- [phone]. Call does not go through to [place]. Doxycycline returned to [facility]. Levaquin delivered earlier to [place], primary RN

## 2023-06-26 NOTE — Progress Notes (Signed)
This RN spoke with Suzan regarding discharge in am. Pt states she will still need a medical transport -per pt her  daughter is unable to bring home O2 to hospital or obtain transport to pick up patient  from the hospital, so pt will need medical transport w/ O2 in am

## 2023-06-26 NOTE — Care Management Obs Status (Signed)
MEDICARE OBSERVATION STATUS NOTIFICATION   Patient Details  Name: Tracy Park MRN: [id number] Date of Birth: [birth year]   Medicare Observation Status Notification Given:  Yes    Beckie Busing, RN 06/26/2023, 10:38 AM

## 2023-06-26 NOTE — Plan of Care (Signed)

## 2023-06-26 NOTE — Progress Notes (Signed)
Discharge meds for home delivered from pharmacy. Secure bag w/ meds given to Katrinka Blazing RN at desk.

## 2023-06-26 NOTE — Evaluation (Signed)
Physical Therapy Evaluation Patient Details Name: Tracy Park MRN: [id number] DOB: [birth year] Today's Date: 06/26/2023  History of Present Illness  Tracy Park is a 67 y.o. female who presented with shortness of breath. Patient recently admitted 10/10-10/13 for acute on chronic hypoxic respiratory failure due to COPD exacerbation. PMH: PAF, COPD with chronic hypoxic respiratory failure on 2 L O2 via [place] at baseline, lung cancer s/p left upper lobectomy, HTN, HLD, hypothyroidism, depression  Clinical Impression  Pt admitted with above diagnosis. Pt from home with daughter, ind at baseline, on 2L O2, denies AD use for ambulation. Pt currently mobilizing with increased time, pausing at times and holding L sidebody reports "feels like my left lung is being squeezed". Pt amb 60 ft with no AD, supv, holding to handrail in hallway at times, declines RW use despite education. Increased pt's O2 to 3L to maintain SpO2 >90% with mobility. Pt would benefit from HHPT, pt possibly in agreement. Pt currently with functional limitations due to the deficits listed below (see PT Problem List). Pt will benefit from acute skilled PT to increase their independence and safety with mobility to allow discharge.           If plan is discharge home, recommend the following: Assist for transportation;Assistance with cooking/housework;Help with stairs or ramp for entrance   Can travel by private vehicle        Equipment Recommendations Rolling walker (2 wheels) (it pt willing)  Recommendations for Other Services       Functional Status Assessment Patient has had a recent decline in their functional status and demonstrates the ability to make significant improvements in function in a reasonable and predictable amount of time.     Precautions / Restrictions Precautions Precautions: Fall Precaution Comments: monitor O2 Restrictions Weight Bearing Restrictions: No      Mobility  Bed Mobility Overal bed  mobility: Modified Independent             General bed mobility comments: HOB elevated    Transfers Overall transfer level: Needs assistance Equipment used: None Transfers: Sit to/from Stand Sit to Stand: Contact guard assist           General transfer comment: CGA to power to stand, slightly unsteady    Ambulation/Gait Ambulation/Gait assistance: Supervision Gait Distance (Feet): 60 Feet Assistive device: None Gait Pattern/deviations: Step-through pattern, Decreased stride length Gait velocity: decreased     General Gait Details: slow, short steps with pt holding to handrail in hallway and pausing at times to hold L sidebody due to pain complaints, good steadiness without LOB but limited due to pain; increased O2 to 3L to maintain SpO2 >90%  Stairs            Wheelchair Mobility     Tilt Bed    Modified Rankin (Stroke Patients Only)       Balance Overall balance assessment: Mild deficits observed, not formally tested                                           Pertinent Vitals/Pain Pain Assessment Pain Assessment: 0-10 Pain Score: 8  Pain Location: "feels like my left lung is being squeezed" Pain Descriptors / Indicators: Grimacing, Guarding, Discomfort Pain Intervention(s): Limited activity within patient's tolerance, Monitored during session, Repositioned, Premedicated before session    Home Living Family/patient expects to be discharged to:: Private residence Living Arrangements:  Children Available Help at Discharge: Family;Available PRN/intermittently Type of Home: House Home Access: Stairs to enter Entrance Stairs-Rails: Right Entrance Stairs-Number of Steps: 3   Home Layout: One level Home Equipment: Cane - single point Additional Comments: On 2 L O2 at home 24/7; can DC to her daughter's home which has several steps to enter with handrail    Prior Function Prior Level of Function : Independent/Modified Independent              Mobility Comments: pt denies using DME, but does have SPC "in case I need it" ADLs Comments: Ind with ADLs/selfcare, IADLs, home mgt, cooking, no longer drives bc she does not have a car     Extremity/Trunk Assessment   Upper Extremity Assessment Upper Extremity Assessment: Overall WFL for tasks assessed    Lower Extremity Assessment Lower Extremity Assessment: Overall WFL for tasks assessed    Cervical / Trunk Assessment Cervical / Trunk Assessment: Normal  Communication   Communication Communication: No apparent difficulties  Cognition Arousal: Alert Behavior During Therapy: WFL for tasks assessed/performed Overall Cognitive Status: Within Functional Limits for tasks assessed                                 General Comments: pt tearful regarding daughter being in complicated court hearing roday, responds well to encouragement wiht therapy, follows commands        General Comments      Exercises     Assessment/Plan    PT Assessment Patient needs continued PT services  PT Problem List Decreased activity tolerance;Cardiopulmonary status limiting activity;Decreased balance;Decreased mobility;Pain       PT Treatment Interventions DME instruction;Gait training;Functional mobility training;Therapeutic activities;Therapeutic exercise;Balance training;Patient/family education    PT Goals (Current goals can be found in the Care Plan section)  Acute Rehab PT Goals Patient Stated Goal: to regain independence PT Goal Formulation: With patient Time For Goal Achievement: 07/10/23 Potential to Achieve Goals: Good    Frequency Min 1X/week     Co-evaluation               AM-PAC PT "6 Clicks" Mobility  Outcome Measure Help needed turning from your back to your side while in a flat bed without using bedrails?: None Help needed moving from lying on your back to sitting on the side of a flat bed without using bedrails?: None Help needed  moving to and from a bed to a chair (including a wheelchair)?: A Little Help needed standing up from a chair using your arms (e.g., wheelchair or bedside chair)?: A Little Help needed to walk in hospital room?: A Little Help needed climbing 3-5 steps with a railing? : A Little 6 Click Score: 20    End of Session Equipment Utilized During Treatment: Oxygen Activity Tolerance: Patient limited by fatigue;Patient limited by pain Patient left: in chair;with call bell/phone within reach Nurse Communication: Mobility status PT Visit Diagnosis: Difficulty in walking, not elsewhere classified (R26.2)    Time: 1610-9604 PT Time Calculation (min) (ACUTE ONLY): 24 min   Charges:   PT Evaluation $PT Eval Low Complexity: 1 Low PT Treatments $Gait Training: 8-22 mins PT General Charges $$ ACUTE PT VISIT: 1 Visit         Tori Keaten Mashek PT, DPT 06/26/23, 12:54 PM

## 2023-06-26 NOTE — Progress Notes (Signed)
Notified Dr Maryfrances Bunnell that Consuello Closs, flow RN spoke with patient's daughter and the daughter stated the earliest she would be able to pick the patient up was in th morning.  Patient currently has a written discharge but also requires oxygen.  Patient is unable to get home oxygen since daughter is unable to come pick hr up today.  MD was agreeable that he was unsure how she could leave without transport.  Patient is stable and will remain hospitalized until daughter can pick her up in the morning.

## 2023-06-26 NOTE — Progress Notes (Signed)
Delay in discharge: Patient's daughter must be in Court today and states she cannot pick up patient with her own oxygen tank until this afternoon.  MD and TOC notified.  [facility] delivered the following Rx's to the bedside: - Oxycodone 5mg  immediate release tab (quantity 12 tabs) - Doxycycline 100mg  cap (quantity 10) - Guaifenesin 600mg  12-hr tabs (quantity 20) - Prednisone 10mg  tab (quantity 22) - Benzonatate 100mg  cap (quantity 30)

## 2023-06-27 ENCOUNTER — Other Ambulatory Visit (HOSPITAL_COMMUNITY): Payer: Self-pay

## 2023-06-27 DIAGNOSIS — R0781 Pleurodynia: Secondary | ICD-10-CM | POA: Diagnosis not present

## 2023-06-27 MED ORDER — PREDNISONE 20 MG PO TABS: 40.0000 mg | ORAL_TABLET | Freq: Every day | ORAL | Status: DC

## 2023-06-27 MED ORDER — OXYCODONE HCL 5 MG PO TABS
5.0000 mg | ORAL_TABLET | Freq: Four times a day (QID) | ORAL | Status: DC | PRN
  Administered 2023-06-27: 5 mg via ORAL
  Filled 2023-06-27: qty 1

## 2023-06-27 MED ORDER — LEVOFLOXACIN 500 MG PO TABS
750.0000 mg | ORAL_TABLET | Freq: Every day | ORAL | Status: DC
  Administered 2023-06-27: 750 mg via ORAL
  Filled 2023-06-27: qty 2

## 2023-06-27 MED ORDER — PREDNISONE 20 MG PO TABS
40.0000 mg | ORAL_TABLET | Freq: Every day | ORAL | Status: DC
  Administered 2023-06-27: 40 mg via ORAL
  Filled 2023-06-27: qty 2

## 2023-06-27 NOTE — Progress Notes (Signed)
Physical Therapy Treatment Patient Details Name: Tracy Park MRN: [id number] DOB: 11-21-1955 Today's Date: 06/27/2023   History of Present Illness Tracy Park is a 67 y.o. female who presented with shortness of breath. Patient recently admitted 10/10-10/13 for acute on chronic hypoxic respiratory failure due to COPD exacerbation. PMH: PAF, COPD with chronic hypoxic respiratory failure on 2 L O2 via [place] at baseline, lung cancer s/p left upper lobectomy, HTN, HLD, hypothyroidism, depression    PT Comments  Pt mobilizes with supv - modified ind, needing increased time. Pt amb 80 ft without AD, holding to handrail intermittently in hallway, stopping every ~20 ft for standing rest breaks, no overt LOB or physical assistance. Pt on 3L O2 with SpO2 94%. Pt c/o 7/10 L sidebody pain, improves with supine rest. Pt returns to supine once back in room, wanting to take a nap.    If plan is discharge home, recommend the following: Assist for transportation;Assistance with cooking/housework;Help with stairs or ramp for entrance   Can travel by private vehicle        Equipment Recommendations  Rolling walker (2 wheels) (it pt willing)    Recommendations for Other Services       Precautions / Restrictions Precautions Precautions: Fall Precaution Comments: monitor O2 Restrictions Weight Bearing Restrictions: No     Mobility  Bed Mobility Overal bed mobility: Modified Independent             General bed mobility comments: HOB elevated    Transfers Overall transfer level: Needs assistance Equipment used: None Transfers: Sit to/from Stand Sit to Stand: Supervision           General transfer comment: supv to power up, no physical assist, slow to fully upright due to L sidebody pain    Ambulation/Gait Ambulation/Gait assistance: Supervision Gait Distance (Feet): 80 Feet Assistive device: None Gait Pattern/deviations: Step-through pattern, Decreased stride length Gait  velocity: decreased     General Gait Details: slow, short steps with pt intermittently holding to handrail in hallway, pt stopping every ~20 ft due to L sidebody pain, no overt LOB, on 3L O2 with SpO2 94%   Stairs             Wheelchair Mobility     Tilt Bed    Modified Rankin (Stroke Patients Only)       Balance Overall balance assessment: Mild deficits observed, not formally tested                                          Cognition Arousal: Alert Behavior During Therapy: WFL for tasks assessed/performed Overall Cognitive Status: Within Functional Limits for tasks assessed                                          Exercises      General Comments        Pertinent Vitals/Pain Pain Assessment Pain Assessment: 0-10 Pain Score: 7  Pain Location: L sidebody Pain Descriptors / Indicators: Grimacing, Guarding, Discomfort Pain Intervention(s): Limited activity within patient's tolerance, Monitored during session, Repositioned, Patient requesting pain meds-RN notified    Home Living                          Prior Function  PT Goals (current goals can now be found in the care plan section) Acute Rehab PT Goals Patient Stated Goal: to regain independence PT Goal Formulation: With patient Time For Goal Achievement: 07/10/23 Potential to Achieve Goals: Good Progress towards PT goals: Progressing toward goals    Frequency    Min 1X/week      PT Plan      Co-evaluation              AM-PAC PT "6 Clicks" Mobility   Outcome Measure  Help needed turning from your back to your side while in a flat bed without using bedrails?: None Help needed moving from lying on your back to sitting on the side of a flat bed without using bedrails?: None Help needed moving to and from a bed to a chair (including a wheelchair)?: A Little Help needed standing up from a chair using your arms (e.g., wheelchair or  bedside chair)?: A Little Help needed to walk in hospital room?: A Little Help needed climbing 3-5 steps with a railing? : A Little 6 Click Score: 20    End of Session Equipment Utilized During Treatment: Oxygen Activity Tolerance: Patient limited by fatigue;Patient limited by pain Patient left: in bed;with call bell/phone within reach Nurse Communication: Mobility status;Patient requests pain meds PT Visit Diagnosis: Difficulty in walking, not elsewhere classified (R26.2)     Time: 1130-1145 PT Time Calculation (min) (ACUTE ONLY): 15 min  Charges:    $Gait Training: 8-22 mins PT General Charges $$ ACUTE PT VISIT: 1 Visit                     Tori Saara Kijowski PT, DPT 06/27/23, 12:23 PM

## 2023-06-27 NOTE — Progress Notes (Addendum)
Message left for patient's daughter at the phone number listed in chart [phone] (patient confirms the same number is in her cell phone for her daughter's number):   [phone]  message left for [facility], daughter, to please call [address] Nurse's station to discuss patient's pick-up time.  [phone]  another message left for [facility] to please call nurse's station on [address].  Addendum at 1106: Patient states her daughter called and states she is at work now and will come at 4pm with oxygen tank.

## 2023-06-27 NOTE — Progress Notes (Signed)
PatientMarland Kitchen WILLAMINA REIDT KGU:[id number] DOB: 05/08/1956      Brief hospital course: Mrs. Costanzo is a 67 y.o. F with COPD and chronic hypoxic respiratory failure on 2 L O2 via [place] at baseline, PAF on Eliquis, lung cancer s/p left upper lobectomy, HTN, HLD, hypothyroidism, depression who is admitted with left sided chest and rib pain with coughing after recent admission for COPD.  CT abdomen and pelvis without contrast in the ER showed no acute findings. Admitted for further work up.    This is a no charge note, patient was discharged yesterday but family did not come to pick her up until this afternoon.   Principal Problem:   Rib pain on left side Active Problems:   Acquired hypothyroidism   History of lung cancer   COPD (chronic obstructive pulmonary disease) with chronic bronchitis (HCC)   Chronic hypoxic respiratory failure (HCC)   Dyslipidemia   Anxiety and depression   Essential hypertension   Paroxysmal atrial fibrillation (HCC)    Continue Levaquin, predinsone at discharge Pulmonology referral sent, I spoke to [facility] clinic, they are working on scheduling, patient desires to transfer care to [facility]                Author: Alberteen Sam, MD 06/27/2023 3:05 PM

## 2023-06-27 NOTE — Progress Notes (Signed)
Call to [facility] (patient's daughter) to see when she would be able to pick pt up fromn the hospital.No answer, voice mail left with call back # requesting daughter to call back regarding when she would be able to pick up her mom. Per TOC, pt's daughter is not home & Melannie does not have a key to get into the home. Patient does not qualify for a medical transport and is on home O2 at 3 liters [place]. One possible option is for daughter to obtain a ride to the hospital and to bring home O2 tank with her, cab voucher can be provided for ride home  from [facility]

## 2023-06-27 NOTE — TOC Progression Note (Signed)
Transition of Care [facility]) - Progression Note    Patient Details  Name: Tracy Park MRN: [id number] Date of Birth: 12/11/1955  Transition of Care [facility]) CM/SW Contact  Beckie Busing, RN Phone Number:[phone]  06/27/2023, 10:20 AM  Clinical Narrative:    CM received message that patient is still here at hospital requesting medical transport with no way top get in the house even if she has transportation. CM at bedside to discuss disposition with patient. Patient starts the conversation of with "yall need to communicate. I've already talked to my nurse about this." Cm explained to patient that bedside nurse sent CM a message and that CM is following up. CM made patient aware of home health recommendation. Patient states that she can not agree to [facility]  she will need to discuss with daughter and she can't call her now because the daughter was up late last night. CM has informed patient that she has been discharged and that [facility] has nothing to offer because even with a ride the patient states that she can not get into the home.Patient has been made aware that CM will update department leadership on this situation. Unit director has been made aware.         Expected Discharge Plan and Services         Expected Discharge Date: 06/26/23                                     Social Determinants of Health (SDOH) Interventions SDOH Screenings   Food Insecurity: No Food Insecurity (06/25/2023)  Recent Concern: Food Insecurity - Food Insecurity Present (06/20/2023)  Housing: Low Risk  (06/25/2023)  Recent Concern: Housing - Medium Risk (04/12/2023)  Transportation Needs: No Transportation Needs (06/25/2023)  Recent Concern: Transportation Needs - Unmet Transportation Needs (04/12/2023)  Utilities: Not At Risk (06/25/2023)  Financial Resource Strain: Medium Risk (04/07/2023)   Received from [facility]  Physical Activity: Unknown (04/07/2023)   Received from [facility]  Social  Connections: Moderately Integrated (04/07/2023)   Received from [facility]  Stress: Stress Concern Present (04/07/2023)   Received from [facility]  Tobacco Use: High Risk (06/25/2023)    Readmission Risk Interventions    04/29/2023    2:02 PM 04/17/2023    4:34 PM 04/13/2023    6:12 PM  Readmission Risk Prevention Plan  Transportation Screening Complete Complete Complete  Medication Review Oceanographer) Complete Complete Complete  PCP or Specialist appointment within 3-5 days of discharge Complete Complete Complete  HRI or Home Care Consult Complete Complete Complete  SW Recovery Care/Counseling Consult Complete Complete Complete  Palliative Care Screening Not Applicable Not Applicable Not Applicable  Skilled Nursing Facility Not Applicable Not Applicable Not Applicable

## 2023-06-27 NOTE — Plan of Care (Signed)
  Problem: Education: Goal: Knowledge of General Education information will improve Description: Including pain rating scale, medication(s)/side effects and non-pharmacologic comfort measures Outcome: Adequate for Discharge   Problem: Health Behavior/Discharge Planning: Goal: Ability to manage health-related needs will improve Outcome: Adequate for Discharge   Problem: Clinical Measurements: Goal: Ability to maintain clinical measurements within normal limits will improve Outcome: Adequate for Discharge Goal: Will remain free from infection Outcome: Adequate for Discharge Goal: Diagnostic test results will improve Outcome: Adequate for Discharge Goal: Respiratory complications will improve Outcome: Adequate for Discharge Goal: Cardiovascular complication will be avoided Outcome: Adequate for Discharge   Problem: Activity: Goal: Risk for activity intolerance will decrease Outcome: Adequate for Discharge   Problem: Nutrition: Goal: Adequate nutrition will be maintained Outcome: Adequate for Discharge   Problem: Coping: Goal: Level of anxiety will decrease Outcome: Adequate for Discharge   Problem: Elimination: Goal: Will not experience complications related to bowel motility Outcome: Adequate for Discharge Goal: Will not experience complications related to urinary retention Outcome: Adequate for Discharge   Problem: Pain Managment: Goal: General experience of comfort will improve Outcome: Adequate for Discharge

## 2023-06-27 NOTE — TOC Progression Note (Addendum)
Transition of Care [facility]) - Progression Note    Patient Details  Name: Tracy Park MRN: [id number] Date of Birth: 1956-09-05  Transition of Care [facility]) CM/SW Contact  Beckie Busing, RN Phone Number:[phone]  06/27/2023, 8:52 AM  Clinical Narrative:    CM at bedside for observation notice. Bedside RN asking if patient can receive PTAR transportation home. CM made nurse and patient aware that patient does not meet criteria for PTAR. Patient states dood, I didn't want PTAR anyway. Patient requesting SUV transport. Patient states that she does not have portable O2 tank and that daughter will have to bring it later. Patient states that daughter can transport but it will be later this evening.         Expected Discharge Plan and Services         Expected Discharge Date: 06/26/23                                     Social Determinants of Health (SDOH) Interventions SDOH Screenings   Food Insecurity: No Food Insecurity (06/25/2023)  Recent Concern: Food Insecurity - Food Insecurity Present (06/20/2023)  Housing: Low Risk  (06/25/2023)  Recent Concern: Housing - Medium Risk (04/12/2023)  Transportation Needs: No Transportation Needs (06/25/2023)  Recent Concern: Transportation Needs - Unmet Transportation Needs (04/12/2023)  Utilities: Not At Risk (06/25/2023)  Financial Resource Strain: Medium Risk (04/07/2023)   Received from [facility]  Physical Activity: Unknown (04/07/2023)   Received from [facility]  Social Connections: Moderately Integrated (04/07/2023)   Received from [facility]  Stress: Stress Concern Present (04/07/2023)   Received from [facility]  Tobacco Use: High Risk (06/25/2023)    Readmission Risk Interventions    04/29/2023    2:02 PM 04/17/2023    4:34 PM 04/13/2023    6:12 PM  Readmission Risk Prevention Plan  Transportation Screening Complete Complete Complete  Medication Review Oceanographer) Complete Complete Complete  PCP or  Specialist appointment within 3-5 days of discharge Complete Complete Complete  HRI or Home Care Consult Complete Complete Complete  SW Recovery Care/Counseling Consult Complete Complete Complete  Palliative Care Screening Not Applicable Not Applicable Not Applicable  Skilled Nursing Facility Not Applicable Not Applicable Not Applicable

## 2023-06-28 ENCOUNTER — Other Ambulatory Visit (HOSPITAL_COMMUNITY): Payer: Self-pay

## 2023-06-30 NOTE — Plan of Care (Signed)
CHL Tonsillectomy/Adenoidectomy, Postoperative PEDS care plan entered in error.

## 2023-07-24 ENCOUNTER — Encounter (HOSPITAL_COMMUNITY): Payer: Self-pay

## 2023-07-24 ENCOUNTER — Emergency Department (HOSPITAL_COMMUNITY): Payer: 59

## 2023-07-24 ENCOUNTER — Other Ambulatory Visit: Payer: Self-pay

## 2023-07-24 ENCOUNTER — Inpatient Hospital Stay (HOSPITAL_COMMUNITY)
Admission: EM | Admit: 2023-07-24 | Discharge: 2023-07-27 | DRG: 191 | Disposition: A | Discharge status: Home / Self Care | Payer: 59 | Attending: Student | Admitting: Student

## 2023-07-24 DIAGNOSIS — Z85118 Personal history of other malignant neoplasm of bronchus and lung: Secondary | ICD-10-CM

## 2023-07-24 DIAGNOSIS — R531 Weakness: Secondary | ICD-10-CM | POA: Diagnosis present

## 2023-07-24 DIAGNOSIS — F3181 Bipolar II disorder: Secondary | ICD-10-CM | POA: Diagnosis present

## 2023-07-24 DIAGNOSIS — Z1152 Encounter for screening for COVID-19: Secondary | ICD-10-CM

## 2023-07-24 DIAGNOSIS — R253 Fasciculation: Secondary | ICD-10-CM | POA: Diagnosis present

## 2023-07-24 DIAGNOSIS — Z7951 Long term (current) use of inhaled steroids: Secondary | ICD-10-CM

## 2023-07-24 DIAGNOSIS — F319 Bipolar disorder, unspecified: Secondary | ICD-10-CM | POA: Diagnosis present

## 2023-07-24 DIAGNOSIS — F419 Anxiety disorder, unspecified: Secondary | ICD-10-CM | POA: Diagnosis present

## 2023-07-24 DIAGNOSIS — Z681 Body mass index (BMI) 19 or less, adult: Secondary | ICD-10-CM

## 2023-07-24 DIAGNOSIS — Z7901 Long term (current) use of anticoagulants: Secondary | ICD-10-CM

## 2023-07-24 DIAGNOSIS — F17210 Nicotine dependence, cigarettes, uncomplicated: Secondary | ICD-10-CM | POA: Diagnosis present

## 2023-07-24 DIAGNOSIS — R636 Underweight: Secondary | ICD-10-CM | POA: Diagnosis present

## 2023-07-24 DIAGNOSIS — Z79899 Other long term (current) drug therapy: Secondary | ICD-10-CM

## 2023-07-24 DIAGNOSIS — Z833 Family history of diabetes mellitus: Secondary | ICD-10-CM

## 2023-07-24 DIAGNOSIS — J441 Chronic obstructive pulmonary disease with (acute) exacerbation: Principal | ICD-10-CM

## 2023-07-24 DIAGNOSIS — J9611 Chronic respiratory failure with hypoxia: Secondary | ICD-10-CM | POA: Diagnosis present

## 2023-07-24 DIAGNOSIS — Z66 Do not resuscitate: Secondary | ICD-10-CM | POA: Diagnosis present

## 2023-07-24 DIAGNOSIS — Z9981 Dependence on supplemental oxygen: Secondary | ICD-10-CM

## 2023-07-24 DIAGNOSIS — E890 Postprocedural hypothyroidism: Secondary | ICD-10-CM | POA: Diagnosis present

## 2023-07-24 DIAGNOSIS — I10 Essential (primary) hypertension: Secondary | ICD-10-CM | POA: Diagnosis present

## 2023-07-24 DIAGNOSIS — Z8249 Family history of ischemic heart disease and other diseases of the circulatory system: Secondary | ICD-10-CM

## 2023-07-24 DIAGNOSIS — Z90710 Acquired absence of both cervix and uterus: Secondary | ICD-10-CM

## 2023-07-24 DIAGNOSIS — Z87442 Personal history of urinary calculi: Secondary | ICD-10-CM

## 2023-07-24 DIAGNOSIS — F32A Depression, unspecified: Secondary | ICD-10-CM | POA: Diagnosis present

## 2023-07-24 DIAGNOSIS — Z886 Allergy status to analgesic agent status: Secondary | ICD-10-CM

## 2023-07-24 DIAGNOSIS — Z79890 Hormone replacement therapy (postmenopausal): Secondary | ICD-10-CM

## 2023-07-24 DIAGNOSIS — Z902 Acquired absence of lung [part of]: Secondary | ICD-10-CM

## 2023-07-24 DIAGNOSIS — K219 Gastro-esophageal reflux disease without esophagitis: Secondary | ICD-10-CM | POA: Diagnosis present

## 2023-07-24 DIAGNOSIS — R0789 Other chest pain: Secondary | ICD-10-CM | POA: Diagnosis present

## 2023-07-24 DIAGNOSIS — J9621 Acute and chronic respiratory failure with hypoxia: Secondary | ICD-10-CM | POA: Diagnosis present

## 2023-07-24 DIAGNOSIS — R0602 Shortness of breath: Secondary | ICD-10-CM | POA: Diagnosis not present

## 2023-07-24 DIAGNOSIS — E039 Hypothyroidism, unspecified: Secondary | ICD-10-CM | POA: Diagnosis present

## 2023-07-24 DIAGNOSIS — I252 Old myocardial infarction: Secondary | ICD-10-CM

## 2023-07-24 DIAGNOSIS — I480 Paroxysmal atrial fibrillation: Secondary | ICD-10-CM | POA: Diagnosis present

## 2023-07-24 LAB — CBC
HCT: 42.9 % (ref 36.0–46.0)
Hemoglobin: 13.3 g/dL (ref 12.0–15.0)
MCH: 30 pg (ref 26.0–34.0)
MCHC: 31 g/dL (ref 30.0–36.0)
MCV: 96.8 fL (ref 80.0–100.0)
Platelets: 246 10*3/uL (ref 150–400)
RBC: 4.43 MIL/uL (ref 3.87–5.11)
RDW: 13.3 % (ref 11.5–15.5)
WBC: 8.8 10*3/uL (ref 4.0–10.5)
nRBC: 0 % (ref 0.0–0.2)

## 2023-07-24 LAB — SARS CORONAVIRUS 2 BY RT PCR: SARS Coronavirus 2 by RT PCR: NEGATIVE

## 2023-07-24 LAB — BASIC METABOLIC PANEL
Anion gap: 9 (ref 5–15)
BUN: 9 mg/dL (ref 8–23)
CO2: 32 mmol/L (ref 22–32)
Calcium: 9 mg/dL (ref 8.9–10.3)
Chloride: 98 mmol/L (ref 98–111)
Creatinine, Ser: 0.53 mg/dL (ref 0.44–1.00)
GFR, Estimated: >60 mL/min (ref >60)
Glucose, Bld: 101 mg/dL — ABNORMAL HIGH (ref 70–99)
Potassium: 3.3 mmol/L — ABNORMAL LOW (ref 3.5–5.1)
Sodium: 139 mmol/L (ref 135–145)

## 2023-07-24 LAB — TROPONIN I (HIGH SENSITIVITY): Troponin I (High Sensitivity): 7 ng/L (ref <18)

## 2023-07-24 LAB — MAGNESIUM: Magnesium: 1.9 mg/dL (ref 1.7–2.4)

## 2023-07-24 MED ORDER — POTASSIUM CHLORIDE 20 MEQ PO PACK
60.0000 meq | PACK | Freq: Once | ORAL | Status: AC
  Administered 2023-07-24: 60 meq via ORAL
  Filled 2023-07-24: qty 3

## 2023-07-24 MED ORDER — METOPROLOL SUCCINATE ER 25 MG PO TB24
12.5000 mg | ORAL_TABLET | Freq: Every day | ORAL | Status: DC
  Administered 2023-07-25 – 2023-07-27 (×3): 12.5 mg via ORAL
  Filled 2023-07-24 (×3): qty 1

## 2023-07-24 MED ORDER — IPRATROPIUM-ALBUTEROL 0.5-2.5 (3) MG/3ML IN SOLN
3.0000 mL | Freq: Once | RESPIRATORY_TRACT | Status: AC
  Administered 2023-07-24: 3 mL via RESPIRATORY_TRACT
  Filled 2023-07-24: qty 3

## 2023-07-24 MED ORDER — POLYETHYLENE GLYCOL 3350 17 G PO PACK: 17.0000 g | PACK | Freq: Every day | ORAL | Status: DC | PRN

## 2023-07-24 MED ORDER — METHYLPREDNISOLONE SODIUM SUCC 125 MG IJ SOLR
60.0000 mg | INTRAMUSCULAR | Status: DC
  Administered 2023-07-25 – 2023-07-27 (×3): 60 mg via INTRAVENOUS
  Filled 2023-07-24 (×3): qty 2

## 2023-07-24 MED ORDER — ACETAMINOPHEN 325 MG PO TABS
650.0000 mg | ORAL_TABLET | Freq: Four times a day (QID) | ORAL | Status: DC | PRN
  Administered 2023-07-25: 650 mg via ORAL
  Filled 2023-07-24: qty 2

## 2023-07-24 MED ORDER — BUDESON-GLYCOPYRROL-FORMOTEROL 160-9-4.8 MCG/ACT IN AERO: 1.0000 | INHALATION_SPRAY | Freq: Two times a day (BID) | RESPIRATORY_TRACT | Status: DC

## 2023-07-24 MED ORDER — POLYVINYL ALCOHOL 1.4 % OP SOLN
1.0000 [drp] | Freq: Three times a day (TID) | OPHTHALMIC | Status: DC | PRN
Start: 2023-07-24 — End: 2023-07-27

## 2023-07-24 MED ORDER — ALBUTEROL SULFATE (2.5 MG/3ML) 0.083% IN NEBU
10.0000 mg | INHALATION_SOLUTION | Freq: Once | RESPIRATORY_TRACT | Status: AC
  Administered 2023-07-24: 10 mg via RESPIRATORY_TRACT
  Filled 2023-07-24: qty 12

## 2023-07-24 MED ORDER — LOSARTAN POTASSIUM 25 MG PO TABS
12.5000 mg | ORAL_TABLET | Freq: Every day | ORAL | Status: DC
  Filled 2023-07-24: qty 0.5

## 2023-07-24 MED ORDER — ACETAMINOPHEN 650 MG RE SUPP: 650.0000 mg | Freq: Four times a day (QID) | RECTAL | Status: DC | PRN

## 2023-07-24 MED ORDER — ALPRAZOLAM 0.5 MG PO TABS
0.5000 mg | ORAL_TABLET | Freq: Two times a day (BID) | ORAL | Status: DC | PRN
  Administered 2023-07-25: 0.5 mg via ORAL
  Filled 2023-07-24: qty 1

## 2023-07-24 MED ORDER — OXYCODONE HCL 5 MG PO TABS
5.0000 mg | ORAL_TABLET | Freq: Three times a day (TID) | ORAL | Status: DC | PRN
  Administered 2023-07-25 (×2): 5 mg via ORAL
  Filled 2023-07-24 (×2): qty 1

## 2023-07-24 MED ORDER — APIXABAN 5 MG PO TABS
5.0000 mg | ORAL_TABLET | Freq: Two times a day (BID) | ORAL | Status: DC
  Administered 2023-07-24 – 2023-07-27 (×6): 5 mg via ORAL
  Filled 2023-07-24 (×6): qty 1

## 2023-07-24 MED ORDER — BENZONATATE 100 MG PO CAPS
100.0000 mg | ORAL_CAPSULE | Freq: Four times a day (QID) | ORAL | Status: DC | PRN
  Administered 2023-07-25 – 2023-07-26 (×2): 100 mg via ORAL
  Filled 2023-07-24 (×3): qty 1

## 2023-07-24 MED ORDER — MOMETASONE FURO-FORMOTEROL FUM 100-5 MCG/ACT IN AERO
2.0000 | INHALATION_SPRAY | Freq: Two times a day (BID) | RESPIRATORY_TRACT | Status: DC
  Administered 2023-07-25: 2 via RESPIRATORY_TRACT
  Filled 2023-07-24: qty 8.8

## 2023-07-24 MED ORDER — MAGNESIUM OXIDE -MG SUPPLEMENT 400 (240 MG) MG PO TABS
400.0000 mg | ORAL_TABLET | Freq: Every day | ORAL | Status: DC
  Administered 2023-07-25 – 2023-07-27 (×3): 400 mg via ORAL
  Filled 2023-07-24 (×3): qty 1

## 2023-07-24 MED ORDER — METHYLPREDNISOLONE SODIUM SUCC 125 MG IJ SOLR
125.0000 mg | Freq: Once | INTRAMUSCULAR | Status: AC
  Administered 2023-07-24: 125 mg via INTRAVENOUS
  Filled 2023-07-24: qty 2

## 2023-07-24 MED ORDER — IPRATROPIUM-ALBUTEROL 0.5-2.5 (3) MG/3ML IN SOLN: 3.0000 mL | RESPIRATORY_TRACT | Status: DC | PRN

## 2023-07-24 MED ORDER — SERTRALINE HCL 25 MG PO TABS
25.0000 mg | ORAL_TABLET | Freq: Every day | ORAL | Status: DC
  Administered 2023-07-25 – 2023-07-27 (×3): 25 mg via ORAL
  Filled 2023-07-24 (×3): qty 1

## 2023-07-24 MED ORDER — UMECLIDINIUM BROMIDE 62.5 MCG/ACT IN AEPB
1.0000 | INHALATION_SPRAY | Freq: Every day | RESPIRATORY_TRACT | Status: DC
  Administered 2023-07-25: 1 via RESPIRATORY_TRACT
  Filled 2023-07-24: qty 7

## 2023-07-24 MED ORDER — SODIUM CHLORIDE 0.9% FLUSH
3.0000 mL | Freq: Two times a day (BID) | INTRAVENOUS | Status: DC
  Administered 2023-07-25 – 2023-07-27 (×5): 3 mL via INTRAVENOUS

## 2023-07-24 MED ORDER — PANTOPRAZOLE SODIUM 20 MG PO TBEC
20.0000 mg | DELAYED_RELEASE_TABLET | Freq: Every day | ORAL | Status: DC
  Administered 2023-07-25 – 2023-07-27 (×3): 20 mg via ORAL
  Filled 2023-07-24 (×3): qty 1

## 2023-07-24 MED ORDER — MAGNESIUM SULFATE 2 GM/50ML IV SOLN
2.0000 g | Freq: Once | INTRAVENOUS | Status: AC
  Administered 2023-07-24: 2 g via INTRAVENOUS
  Filled 2023-07-24: qty 50

## 2023-07-24 MED ORDER — IPRATROPIUM-ALBUTEROL 0.5-2.5 (3) MG/3ML IN SOLN
3.0000 mL | RESPIRATORY_TRACT | Status: DC
  Administered 2023-07-24 – 2023-07-25 (×2): 3 mL via RESPIRATORY_TRACT
  Filled 2023-07-24 (×2): qty 3

## 2023-07-24 MED ORDER — ROSUVASTATIN CALCIUM 20 MG PO TABS
20.0000 mg | ORAL_TABLET | Freq: Every day | ORAL | Status: DC
  Administered 2023-07-25 – 2023-07-27 (×3): 20 mg via ORAL
  Filled 2023-07-24 (×3): qty 1

## 2023-07-24 MED ORDER — LEVOTHYROXINE SODIUM 25 MCG PO TABS
125.0000 ug | ORAL_TABLET | Freq: Every day | ORAL | Status: DC
  Administered 2023-07-25 – 2023-07-27 (×3): 125 ug via ORAL
  Filled 2023-07-24 (×3): qty 1

## 2023-07-24 NOTE — ED Triage Notes (Signed)
Per EMS:  Sob Ongoing for 1 day Hx Lung CA Left upper lung removal Took Albuterol neb x3

## 2023-07-24 NOTE — ED Provider Notes (Signed)
[place] EMERGENCY DEPARTMENT AT [facility] Provider Note   CSN: [id number] Arrival date & time: 07/24/23  1624    History  No chief complaint on file.  Tracy Park is a 67 y.o. female.  67 year old female presenting with shortness of breath x 1 day which has progressively worsened despite her using her albuterol nebulizer 3 times daily.  She denies fevers but does endorse chills.  She has a congested cough with sputum production.  She states this feels like a COPD exacerbation as she is having worsening wheezing and tightness in her chest.  She has been compliant with taking her Eliquis and respiratory medications.  She has remained compliant with her home O2 on 2 L [place].  She has a significant smoking history of approximately 50 pack years and continues to smoke seldomly. Past medical history of lung cancer status post left upper lobe resection, paroxysmal A-fib on Eliquis, MI, COPD, hypothyroidism.     Home Medications Prior to Admission medications   Medication Sig Start Date End Date Taking? Authorizing Provider  albuterol (PROVENTIL) (2.5 MG/3ML) 0.083% nebulizer solution Take 3 mLs (2.5 mg total) by nebulization every 4 (four) hours while awake for 3 days, THEN 3 mLs (2.5 mg total) every 4 (four) hours as needed for wheezing or shortness of breath. 06/26/23 07/29/23  Danford, Earl Lites, MD  albuterol (VENTOLIN HFA) 108 (90 Base) MCG/ACT inhaler Inhale 2 puffs into the lungs every 6 (six) hours as needed for wheezing or shortness of breath. Patient taking differently: Inhale 2 puffs into the lungs every 4 (four) hours as needed for wheezing or shortness of breath. 01/13/23   Burnadette Pop, MD  ALPRAZolam Prudy Feeler) 0.5 MG tablet Take 0.5 mg by mouth in the morning and at bedtime. 03/30/23   [provider]  apixaban (ELIQUIS) 5 MG TABS tablet Take 1 tablet (5 mg total) by mouth 2 (two) times daily. 06/06/23   Jonah Blue, MD  benzonatate (TESSALON PERLES)  100 MG capsule Take 1 capsule (100 mg total) by mouth every 6 (six) hours as needed for cough. 06/26/23 06/25/24  Danford, Earl Lites, MD  Budeson-Glycopyrrol-Formoterol (BREZTRI AEROSPHERE) 160-9-4.8 MCG/ACT AERO Inhale 1 puff into the lungs in the morning and at bedtime.    [provider]  levothyroxine (SYNTHROID) 125 MCG tablet Take 125 mcg by mouth daily before breakfast.    [provider]  losartan (COZAAR) 25 MG tablet Take 12.5 mg by mouth daily.    [provider]  magnesium oxide (MAG-OX) 400 (240 Mg) MG tablet Take 400 mg by mouth in the morning.    [provider]  metoprolol succinate (TOPROL-XL) 25 MG 24 hr tablet Take 0.5 tablets (12.5 mg total) by mouth daily. 06/22/23 07/22/23  Narda Bonds, MD  nitroGLYCERIN (NITROSTAT) 0.4 MG SL tablet Place 0.4 mg under the tongue every 5 (five) minutes as needed for chest pain.    [provider]  oxyCODONE (ROXICODONE) 5 MG immediate release tablet Take 1 tablet (5 mg total) by mouth every 8 (eight) hours as needed for severe pain (pain score 7-10). 06/26/23 06/25/24  DanfordEarl Lites, MD  OXYGEN Inhale 2 L/min into the lungs continuous.    [provider]  predniSONE (DELTASONE) 10 MG tablet Take 40 mg (4 tabs) daily for 1 more day then take 30 mg (3 tabs) daily for 3 days then take 20 mg (2 tabs) daily for 3 days then take 10 mg (1 tab) daily for three  days then stop2 06/26/23   Danford, Earl Lites, MD  rosuvastatin (CRESTOR) 20 MG tablet Take 20 mg by mouth daily.    [provider]  sertraline (ZOLOFT) 25 MG tablet Take 25 mg by mouth in the morning.    [provider]  SYSTANE COMPLETE PF 0.6 % SOLN Place 1 drop into both eyes 3 (three) times daily as needed (for dryness).    [provider]  TYLENOL 8 HOUR 650 MG CR tablet Take 650-1,300 mg by mouth 2 (two) times daily as needed for pain.    [provider]      Allergies    Red dye  #40 (allura red), Strawberry extract, Tomato, Aspirin, Tape, and Wound dressing adhesive    Review of Systems   Review of Systems  Respiratory:  Positive for cough, chest tightness, shortness of breath and wheezing.    Physical Exam Updated Vital Signs BP 113/66 (BP Location: Left Arm)   Pulse 81   Temp 97.9 F (36.6 C) (Oral)   Resp (!) 25   Ht 5\' 3"  (1.6 m)   Wt 44.5 kg   SpO2 97%   BMI 17.36 kg/m  Physical Exam Constitutional:      Appearance: Normal appearance.  Cardiovascular:     Rate and Rhythm: Normal rate and regular rhythm.     Heart sounds: Normal heart sounds.  Pulmonary:     Breath sounds: Wheezing (Inspiratory/expiratory) present.     Comments: Increased WOB Chest:     Chest wall: Tenderness present.  Abdominal:     General: Abdomen is flat. Bowel sounds are normal.  Musculoskeletal:     Cervical back: Normal range of motion.  Neurological:     Mental Status: She is alert.    ED Results / Procedures / Treatments   Labs (all labs ordered are listed, but only abnormal results are displayed) Labs Reviewed  BASIC METABOLIC PANEL - Abnormal; Notable for the following components:      Result Value   Potassium 3.3 (*)    Glucose, Bld 101 (*)    All other components within normal limits  CBC  TROPONIN I (HIGH SENSITIVITY)  TROPONIN I (HIGH SENSITIVITY)   EKG None  Radiology DG Chest 2 View  Result Date: 07/24/2023 CLINICAL DATA:  Shortness of breath.  History of lung cancer. EXAM: CHEST - 2 VIEW COMPARISON:  Chest radiograph dated June 24, 2023 FINDINGS: The heart size and mediastinal contours are unchanged. Postsurgical changes related to left upper lobectomy with left apical pleural-parenchymal scarring, volume loss and similar leftward deviation of the superior mediastinum and trachea. No focal consolidation. Chronic blunting of the left costophrenic angle. No pleural effusion or pneumothorax. Emphysema. No acute osseous abnormality. IMPRESSION: No  acute cardiopulmonary findings. Emphysema. Postsurgical changes related to left upper lobectomy. Electronically Signed   By: Hart Robinsons M.D.   On: 07/24/2023 20:53    Procedures Procedures    Medications Ordered in ED Medications  magnesium sulfate IVPB 2 g 50 mL (2 g Intravenous New Bag/Given 07/24/23 2136)  methylPREDNISolone sodium succinate (SOLU-MEDROL) 125 mg/2 mL injection 125 mg (125 mg Intravenous Given 07/24/23 1734)  ipratropium-albuterol (DUONEB) 0.5-2.5 (3) MG/3ML nebulizer solution 3 mL (3 mLs Nebulization Given 07/24/23 1735)  albuterol (PROVENTIL) (2.5 MG/3ML) 0.083% nebulizer solution 10 mg (10 mg Nebulization Given 07/24/23 1923)    ED Course/ Medical Decision Making/ A&P  Medical Decision Making 67 year old female presenting with increased work of breathing.  She has a significant past medical history of lung cancer status post left upper lobectomy, COPD, paroxysmal A-fib on Eliquis, and is on home O2 of 2 L [place].  Differential diagnosis considered COPD exacerbation, pneumonia, pneumothorax, lung cancer recurrence, new onset CHF, DVT/PE, anemia, ACS.  Her symptomatology and increased work of breathing with wheezing is most consistent with COPD exacerbation.  PE is unlikely given her compliance with Eliquis.  Anemia ruled out with normal CBC.  Troponin 7 makes ACS highly unlikely.  Provided Solu-Medrol 125, DuoNeb.  Inspiratory/expiratory wheezing worsened with this.  Opted to progress to continuous albuterol therapy 10 mg. She has extensive antibiotic exposure and was last discharged with Levaquin 1 month ago for her prior COPD exacerbation. Work of breathing tracely improved with continuous albuterol therapy however given continued shortness of breath and increased work of breathing in addition to physical exam, recommend admission for observation and continued medical management.  Provided magnesium sulfate 2 g IV.  Hospitalist to  admit.  Amount and/or Complexity of Data Reviewed Labs: ordered. Radiology: ordered.         Final Clinical Impression(s) / ED Diagnoses Final diagnoses:  COPD exacerbation [facility])   Rx / DC Orders ED Discharge Orders     None        Shelby Mattocks, DO 07/24/23 2157    Maia Plan, MD 07/31/23 [phone]

## 2023-07-24 NOTE — Assessment & Plan Note (Signed)
Patient had increased work of breathing in the ER, patient has had partial response to treatment with 125 mg of Solu-Medrol, magnesium sulfate as well as continuous albuterol therapy.  Patient appears much more relaxed to me currently.  Although lung exam findings still show expiratory wheezes and occasional crackles.  Patient's cough is dry.  There is no white count no fever and patient appears nontoxic.  Check respiratory viral panel.  Continue treatment with methylprednisolone 60 mg daily and DuoNebs every 4 hourly.  There is no new hypoxia at this time.  We will defer antibiotic therapy just yet.

## 2023-07-24 NOTE — ED Notes (Signed)
ED TO INPATIENT HANDOFF REPORT  Name/Age/Gender Kenidy L Blethen 67 y.o. female  Code Status    Code Status Orders  (From admission, onward)           Start     Ordered   07/24/23 2244  Do not attempt resuscitation (DNR)- Limited -Do Not Intubate (DNI)  Continuous       Question Answer Comment  If pulseless and not breathing No CPR or chest compressions.   In Pre-Arrest Conditions (Patient Is Breathing and Has A Pulse) Do not intubate. Provide all appropriate non-invasive medical interventions. Avoid ICU transfer unless indicated or required.   Consent: Discussion documented in EHR or advanced directives reviewed      07/24/23 2244           Code Status History     Date Active Date Inactive Code Status Order ID Comments User Context   06/25/2023 0003 06/27/2023 2009 Limited: Do not attempt resuscitation (DNR) -DNR-LIMITED -Do Not Intubate/DNI  [id number]  Charlsie Quest, MD ED   06/19/2023 1739 06/22/2023 2128 Do not attempt resuscitation (DNR) PRE-ARREST INTERVENTIONS DESIRED [id number]  Hannah Beat, MD ED   06/04/2023 1509 06/06/2023 1814 Do not attempt resuscitation (DNR) PRE-ARREST INTERVENTIONS DESIRED [id number]  Maryln Gottron, MD ED   04/28/2023 0741 05/01/2023 1830 DNR [id number]  Maryln Gottron, MD ED   04/17/2023 0151 04/18/2023 1944 DNR [id number]  Anselm Jungling, DO Inpatient   04/11/2023 2132 04/14/2023 0035 Full Code [id number]  Hugelmeyer, Alexis, DO ED   03/26/2023 0938 03/27/2023 1838 DNR [id number]  Bobette Mo, MD Inpatient   03/06/2023 2249 03/10/2023 2107 DNR [id number]  Angie Fava, DO ED   02/23/2023 2046 02/26/2023 2245 DNR [id number]  Briscoe Deutscher, MD ED   01/25/2023 2350 01/28/2023 1928 DNR [id number]  Anselm Jungling, DO Inpatient   01/08/2023 0235 01/13/2023 1928 DNR [id number]  Hillary Bow, DO Inpatient   01/08/2023 0117 01/08/2023 0235 Full Code [id number]  Hillary Bow, DO ED   03/24/2022 1159 03/28/2022 1546 Full Code [id number]  Bobette Mo, MD  ED   03/14/2022 2132 03/16/2022 1602 Full Code [id number]  Karl Ito, MD Inpatient   03/14/2022 0331 03/14/2022 2132 DNR [id number]  Tomma Lightning, MD ED   03/13/2022 2149 03/14/2022 0331 DNR [id number]  Arville Care, Vernetta Honey, MD ED   02/26/2022 0200 03/01/2022 1555 DNR [id number]  Anselm Jungling, DO ED   08/16/2021 2316 08/25/2021 2324 DNR [id number]  John Giovanni, MD ED   08/16/2021 2253 08/16/2021 2316 Full Code [id number]  John Giovanni, MD ED   05/03/2021 0052 05/05/2021 1756 Full Code [id number]  Eduard Clos, MD ED   04/10/2020 0342 04/12/2020 2137 Full Code [id number]  Eduard Clos, MD ED   04/28/2012 0926 04/28/2012 2047 Full Code [id number]  Kelby Aline, RN Inpatient       Home/SNF/Other Home  Chief Complaint COPD exacerbation [facility]) [J44.1]  Level of Care/Admitting Diagnosis ED Disposition     ED Disposition  Admit   Condition  --   Comment  Hospital Area: [facility] [[id number]]  Level of Care: Med-Surg [16]  May place patient in observation at [facility] or [facility] if equivalent level of care is available:: No  Covid Evaluation: Symptomatic Person Under Investigation (PUI) or recent exposure (last 10 days) *Testing Required*  Diagnosis: COPD exacerbation [facility]) [[id number]]  Admitting Physician: Nolberto Hanlon [[id number]]  Attending Physician: Nolberto Hanlon [[id number]]  Medical History Past Medical History:  Diagnosis Date   Anginal pain (HCC)    Anxiety    Bipolar disorder (HCC)    Cancer (HCC)    COPD (chronic obstructive pulmonary disease) (HCC)    Dyspnea    Family history of adverse reaction to anesthesia    History of kidney stones    Hydroureteronephrosis 08/16/2021   Hypothyroidism    Lung cancer (HCC)    Myocardial infarction (HCC)    Paroxysmal atrial fibrillation (HCC)    PTSD (post-traumatic stress disorder)    Sleep apnea    Thyroid disease     Allergies Allergies  Allergen Reactions   Red Dye #40 (Allura  Red) Hives, Itching and Other (See Comments)    Red food dye   Strawberry Extract Hives and Itching   Tomato Hives and Itching   Aspirin Hives   Tape Rash and Other (See Comments)    Prefers paper tape   Wound Dressing Adhesive Rash    IV Location/Drains/Wounds Patient Lines/Drains/Airways Status     Active Line/Drains/Airways     Name Placement date Placement time Site Days   Peripheral IV 07/24/23 20 G Right Antecubital 07/24/23  1645  Antecubital  less than 1            Labs/Imaging Results for orders placed or performed during the hospital encounter of 07/24/23 (from the past 48 hour(s))  Basic metabolic panel     Status: Abnormal   Collection Time: 07/24/23  4:37 PM  Result Value Ref Range   Sodium 139 135 - 145 mmol/L   Potassium 3.3 (L) 3.5 - 5.1 mmol/L   Chloride 98 98 - 111 mmol/L   CO2 32 22 - 32 mmol/L   Glucose, Bld 101 (H) 70 - 99 mg/dL    Comment: Glucose reference range applies only to samples taken after fasting for at least 8 hours.   BUN 9 8 - 23 mg/dL   Creatinine, Ser 1.61 0.44 - 1.00 mg/dL   Calcium 9.0 8.9 - 09.6 mg/dL   GFR, Estimated >04 >54 mL/min    Comment: (NOTE) Calculated using the CKD-EPI Creatinine Equation (2021)    Anion gap 9 5 - 15    Comment: Performed at [facility], [address] [place], [place] [address]  CBC     Status: None   Collection Time: 07/24/23  4:37 PM  Result Value Ref Range   WBC 8.8 4.0 - 10.5 K/uL   RBC 4.43 3.87 - 5.11 MIL/uL   Hemoglobin 13.3 12.0 - 15.0 g/dL   HCT 91.4 78.2 - 95.6 %   MCV 96.8 80.0 - 100.0 fL   MCH 30.0 26.0 - 34.0 pg   MCHC 31.0 30.0 - 36.0 g/dL   RDW 21.3 08.6 - 57.8 %   Platelets 246 150 - 400 K/uL   nRBC 0.0 0.0 - 0.2 %    Comment: Performed at [facility], [address], [place], [place] [address]  Troponin I (High Sensitivity)     Status: None   Collection Time: 07/24/23  4:37 PM  Result Value Ref Range   Troponin I (High Sensitivity)  7 <18 ng/L    Comment: (NOTE) Elevated high sensitivity troponin I (hsTnI) values and significant  changes across serial measurements may suggest ACS but many other  chronic and acute conditions are known to elevate hsTnI results.  Refer to the "Links" section for chest pain algorithms and additional  guidance. Performed at [facility]  [facility], [address], [place], [place] [address]   Magnesium     Status: None   Collection Time: 07/24/23  4:37 PM  Result Value Ref Range   Magnesium 1.9 1.7 - 2.4 mg/dL    Comment: Performed at [facility], [address], [place], [place] [address]   DG Chest 2 View  Result Date: 07/24/2023 CLINICAL DATA:  Shortness of breath.  History of lung cancer. EXAM: CHEST - 2 VIEW COMPARISON:  Chest radiograph dated June 24, 2023 FINDINGS: The heart size and mediastinal contours are unchanged. Postsurgical changes related to left upper lobectomy with left apical pleural-parenchymal scarring, volume loss and similar leftward deviation of the superior mediastinum and trachea. No focal consolidation. Chronic blunting of the left costophrenic angle. No pleural effusion or pneumothorax. Emphysema. No acute osseous abnormality. IMPRESSION: No acute cardiopulmonary findings. Emphysema. Postsurgical changes related to left upper lobectomy. Electronically Signed   By: Hart Robinsons M.D.   On: 07/24/2023 20:53    Pending Labs Unresulted Labs (From admission, onward)     Start     Ordered   07/25/23 0500  APTT  Tomorrow morning,   R        07/24/23 2244   07/25/23 0500  Protime-INR  Tomorrow morning,   R        07/24/23 2244   07/25/23 0500  Basic metabolic panel  Tomorrow morning,   R        07/24/23 2244   07/25/23 0500  CBC  Tomorrow morning,   R        07/24/23 2244   07/24/23 2235  Respiratory (~20 pathogens) panel by PCR  (Respiratory panel by PCR (~20 pathogens, ~24 hr TAT)  w precautions)  Once,   URGENT        07/24/23  2234   07/24/23 2235  SARS Coronavirus 2 by RT PCR (hospital order, performed in [facility] hospital lab) *cepheid single result test* Anterior Nasal Swab  (Tier 2 - SARS Coronavirus 2 by RT PCR (hospital order, performed in [facility] hospital lab) *cepheid single result test*)  Once,   URGENT        07/24/23 2234            Vitals/Pain Today's Vitals   07/24/23 1930 07/24/23 2050 07/24/23 2200 07/24/23 2303  BP: 122/76 113/66 105/64   Pulse: 79 81 83   Resp: (!) 22 (!) 25 20   Temp:  97.9 F (36.6 C)    TempSrc:  Oral    SpO2: 100% 97% 96% 96%  Weight:      Height:      PainSc:        Isolation Precautions Airborne and Contact precautions  Medications Medications  ipratropium-albuterol (DUONEB) 0.5-2.5 (3) MG/3ML nebulizer solution 3 mL (has no administration in time range)  methylPREDNISolone sodium succinate (SOLU-MEDROL) 125 mg/2 mL injection 60 mg (has no administration in time range)  pantoprazole (PROTONIX) EC tablet 20 mg (has no administration in time range)  ipratropium-albuterol (DUONEB) 0.5-2.5 (3) MG/3ML nebulizer solution 3 mL (3 mLs Nebulization Given 07/24/23 2302)  oxyCODONE (Oxy IR/ROXICODONE) immediate release tablet 5 mg (has no administration in time range)  ALPRAZolam (XANAX) tablet 0.5 mg (has no administration in time range)  apixaban (ELIQUIS) tablet 5 mg (5 mg Oral Given 07/24/23 2255)  sertraline (ZOLOFT) tablet 25 mg (has no administration in time range)  rosuvastatin (CRESTOR) tablet 20 mg (has no administration in time range)  losartan (  COZAAR) tablet 12.5 mg (has no administration in time range)  metoprolol succinate (TOPROL-XL) 24 hr tablet 12.5 mg (has no administration in time range)  benzonatate (TESSALON) capsule 100 mg (has no administration in time range)  magnesium oxide (MAG-OX) tablet 400 mg (has no administration in time range)  polyvinyl alcohol (LIQUIFILM TEARS) 1.4 % ophthalmic solution 1 drop (has no administration in time  range)  levothyroxine (SYNTHROID) tablet 125 mcg (has no administration in time range)  acetaminophen (TYLENOL) tablet 650 mg (has no administration in time range)    Or  acetaminophen (TYLENOL) suppository 650 mg (has no administration in time range)  polyethylene glycol (MIRALAX / GLYCOLAX) packet 17 g (has no administration in time range)  sodium chloride flush (NS) 0.9 % injection 3 mL (has no administration in time range)  umeclidinium bromide (INCRUSE ELLIPTA) 62.5 MCG/ACT 1 puff (has no administration in time range)  mometasone-formoterol (DULERA) 100-5 MCG/ACT inhaler 2 puff (has no administration in time range)  methylPREDNISolone sodium succinate (SOLU-MEDROL) 125 mg/2 mL injection 125 mg (125 mg Intravenous Given 07/24/23 1734)  ipratropium-albuterol (DUONEB) 0.5-2.5 (3) MG/3ML nebulizer solution 3 mL (3 mLs Nebulization Given 07/24/23 1735)  albuterol (PROVENTIL) (2.5 MG/3ML) 0.083% nebulizer solution 10 mg (10 mg Nebulization Given 07/24/23 1923)  magnesium sulfate IVPB 2 g 50 mL (2 g Intravenous New Bag/Given 07/24/23 2136)  potassium chloride (KLOR-CON) packet 60 mEq (60 mEq Oral Given 07/24/23 2255)    Mobility walks with person assist

## 2023-07-24 NOTE — H&P (Signed)
History and Physical    Patient: Tracy Park:[id number] DOB: 19-Oct-1955 DOA: 07/24/2023 DOS: the patient was seen and examined on 07/24/2023 PCP: Jacquelin Hawking, PA-C  Patient coming from: Home  Chief Complaint: No chief complaint on file.  HPI: Tracy Park is a 67 y.o. female with medical history significant of prior remote lower lung cancer with left upper lobe lung resection, diagnosis of COPD for which patient chronically uses 2 L/min of supplementary oxygen.  With above level of respiratory supplementation, patient reports being able to walk around slowly with a cane without any marked sensation of shortness of breath.  Patient does have occasional chronic cough which is dry.  Patient reports being in her usual state of health till last evening, since this a.m. she has developed an incessant dry cough and a sensation of chest tightness when she coughs with associated sensation of shortness of breathing which is especially worse when she coughs but even presents somewhat without coughing.  There is no report of patient having any fever leg swelling or cramping.  Patient came to the ER because of sensation of shortness of breath and chest tightness.  ER course was discussed with the ER provider who advised that the patient is still on 2 L/min of supplementary oxygen.  However required high-dose Solu-Medrol as well as continuous albuterol therapy to control her work of breathing.  In spite of this the response was felt to be suboptimal.  Patient currently reports that she is feeling "better ".  However she is not feeling as well as she should feel typically after respiratory therapy and treatment in the ER.  Medical admission is sought. Review of Systems: As mentioned in the history of present illness. All other systems reviewed and are negative. Past Medical History:  Diagnosis Date   Anginal pain (HCC)    Anxiety    Bipolar disorder (HCC)    Cancer (HCC)    COPD  (chronic obstructive pulmonary disease) (HCC)    Dyspnea    Family history of adverse reaction to anesthesia    History of kidney stones    Hydroureteronephrosis 08/16/2021   Hypothyroidism    Lung cancer (HCC)    Myocardial infarction (HCC)    Paroxysmal atrial fibrillation (HCC)    PTSD (post-traumatic stress disorder)    Sleep apnea    Thyroid disease    Past Surgical History:  Procedure Laterality Date   ABDOMINAL HYSTERECTOMY     BACK SURGERY     CYSTOSCOPY W/ URETERAL STENT PLACEMENT Right 05/03/2021   Procedure: CYSTOSCOPY WITH RETROGRADE PYELOGRAM/URETERAL STENT PLACEMENT;  Surgeon: Crist Fat, MD;  Location: [facility];  Service: Urology;  Laterality: Right;   EYE SURGERY     kidney stent     thyroidectomy     Social History:  reports that she has been smoking cigarettes. She has never used smokeless tobacco. She reports that she does not currently use alcohol. She reports that she does not currently use drugs.  Allergies  Allergen Reactions   Red Dye #40 (Allura Red) Hives, Itching and Other (See Comments)    Red food dye   Strawberry Extract Hives and Itching   Tomato Hives and Itching   Aspirin Hives   Tape Rash and Other (See Comments)    Prefers paper tape   Wound Dressing Adhesive Rash    Family History  Problem Relation Age of Onset   Hypertension Mother    Heart failure Mother    Hypertension  Father    Diabetes Father    Heart failure Father     Prior to Admission medications   Medication Sig Start Date End Date Taking? Authorizing Provider  albuterol (PROVENTIL) (2.5 MG/3ML) 0.083% nebulizer solution Take 3 mLs (2.5 mg total) by nebulization every 4 (four) hours while awake for 3 days, THEN 3 mLs (2.5 mg total) every 4 (four) hours as needed for wheezing or shortness of breath. 06/26/23 07/29/23 Yes Danford, Earl Lites, MD  albuterol (VENTOLIN HFA) 108 (90 Base) MCG/ACT inhaler Inhale 2 puffs into the lungs every 6 (six) hours as needed for  wheezing or shortness of breath. Patient taking differently: Inhale 2 puffs into the lungs every 4 (four) hours as needed for wheezing or shortness of breath. 01/13/23  Yes Burnadette Pop, MD  ALPRAZolam Prudy Feeler) 0.5 MG tablet Take 0.5 mg by mouth in the morning and at bedtime. 03/30/23  Yes [provider]  apixaban (ELIQUIS) 5 MG TABS tablet Take 1 tablet (5 mg total) by mouth 2 (two) times daily. 06/06/23  Yes Jonah Blue, MD  benzonatate (TESSALON PERLES) 100 MG capsule Take 1 capsule (100 mg total) by mouth every 6 (six) hours as needed for cough. 06/26/23 06/25/24 Yes Danford, Earl Lites, MD  Budeson-Glycopyrrol-Formoterol (BREZTRI AEROSPHERE) 160-9-4.8 MCG/ACT AERO Inhale 1 puff into the lungs in the morning and at bedtime.   Yes [provider]  levothyroxine (SYNTHROID) 125 MCG tablet Take 125 mcg by mouth daily before breakfast.   Yes [provider]  losartan (COZAAR) 25 MG tablet Take 12.5 mg by mouth daily.   Yes [provider]  metoprolol succinate (TOPROL-XL) 25 MG 24 hr tablet Take 0.5 tablets (12.5 mg total) by mouth daily. 06/22/23 07/24/23 Yes Narda Bonds, MD  nitroGLYCERIN (NITROSTAT) 0.4 MG SL tablet Place 0.4 mg under the tongue every 5 (five) minutes as needed for chest pain.   Yes [provider]  oxyCODONE (ROXICODONE) 5 MG immediate release tablet Take 1 tablet (5 mg total) by mouth every 8 (eight) hours as needed for severe pain (pain score 7-10). 06/26/23 06/25/24 Yes Danford, Earl Lites, MD  OXYGEN Inhale 2 L/min into the lungs continuous.   Yes [provider]  rosuvastatin (CRESTOR) 20 MG tablet Take 20 mg by mouth daily.   Yes [provider]  sertraline (ZOLOFT) 25 MG tablet Take 25 mg by mouth in the morning.   Yes [provider]  SYSTANE COMPLETE PF 0.6 % SOLN Place 1 drop into both eyes 3 (three) times daily as needed (for dryness).   Yes [provider]  TYLENOL 8 HOUR 650  MG CR tablet Take 650-1,300 mg by mouth 2 (two) times daily as needed for pain.   Yes [provider]  magnesium oxide (MAG-OX) 400 (240 Mg) MG tablet Take 400 mg by mouth in the morning.    [provider]  predniSONE (DELTASONE) 10 MG tablet Take 40 mg (4 tabs) daily for 1 more day then take 30 mg (3 tabs) daily for 3 days then take 20 mg (2 tabs) daily for 3 days then take 10 mg (1 tab) daily for three days then stop2 Patient not taking: Reported on 07/24/2023 06/26/23   Alberteen Sam, MD    Physical Exam: Vitals:   07/24/23 1923 07/24/23 1930 07/24/23 2050 07/24/23 2200  BP:  122/76 113/66 105/64  Pulse:  79 81 83  Resp:  (!) 22 (!) 25 20  Temp:   97.9 F (36.6  C)   TempSrc:   Oral   SpO2: 96% 100% 97% 96%  Weight:      Height:       General: Patient is alert and awake, laying at 45 degrees in the bed does not appear to be distressed on 2 L/min of supplementary oxygen. Respiratory exam: Limited as patient could not take a deep breath without having coughing spells.  But there are diffuse expiratory wheezes that are appreciated, occasional inspiratory crackles are diffuse Cardiovascular exam S1-S2 normal Abdomen all quadrants are soft nontender   extremities warm without edema. Data Reviewed:  Labs on Admission:  Results for orders placed or performed during the hospital encounter of 07/24/23 (from the past 24 hour(s))  Basic metabolic panel     Status: Abnormal   Collection Time: 07/24/23  4:37 PM  Result Value Ref Range   Sodium 139 135 - 145 mmol/L   Potassium 3.3 (L) 3.5 - 5.1 mmol/L   Chloride 98 98 - 111 mmol/L   CO2 32 22 - 32 mmol/L   Glucose, Bld 101 (H) 70 - 99 mg/dL   BUN 9 8 - 23 mg/dL   Creatinine, Ser 1.61 0.44 - 1.00 mg/dL   Calcium 9.0 8.9 - 09.6 mg/dL   GFR, Estimated >04 >54 mL/min   Anion gap 9 5 - 15  CBC     Status: None   Collection Time: 07/24/23  4:37 PM  Result Value Ref Range   WBC 8.8 4.0 - 10.5 K/uL   RBC 4.43 3.87  - 5.11 MIL/uL   Hemoglobin 13.3 12.0 - 15.0 g/dL   HCT 09.8 11.9 - 14.7 %   MCV 96.8 80.0 - 100.0 fL   MCH 30.0 26.0 - 34.0 pg   MCHC 31.0 30.0 - 36.0 g/dL   RDW 82.9 56.2 - 13.0 %   Platelets 246 150 - 400 K/uL   nRBC 0.0 0.0 - 0.2 %  Troponin I (High Sensitivity)     Status: None   Collection Time: 07/24/23  4:37 PM  Result Value Ref Range   Troponin I (High Sensitivity) 7 <18 ng/L   Basic Metabolic Panel: Recent Labs  Lab 07/24/23 1637  NA 139  K 3.3*  CL 98  CO2 32  GLUCOSE 101*  BUN 9  CREATININE 0.53  CALCIUM 9.0   Liver Function Tests: No results for input(s): "AST", "ALT", "ALKPHOS", "BILITOT", "PROT", "ALBUMIN" in the last 168 hours. No results for input(s): "LIPASE", "AMYLASE" in the last 168 hours. No results for input(s): "AMMONIA" in the last 168 hours. CBC: Recent Labs  Lab 07/24/23 1637  WBC 8.8  HGB 13.3  HCT 42.9  MCV 96.8  PLT 246   Cardiac Enzymes: Recent Labs  Lab 07/24/23 1637  TROPONINIHS 7    BNP (last 3 results) No results for input(s): "PROBNP" in the last 8760 hours. CBG: No results for input(s): "GLUCAP" in the last 168 hours.  Radiological Exams on Admission:  DG Chest 2 View  Result Date: 07/24/2023 CLINICAL DATA:  Shortness of breath.  History of lung cancer. EXAM: CHEST - 2 VIEW COMPARISON:  Chest radiograph dated June 24, 2023 FINDINGS: The heart size and mediastinal contours are unchanged. Postsurgical changes related to left upper lobectomy with left apical pleural-parenchymal scarring, volume loss and similar leftward deviation of the superior mediastinum and trachea. No focal consolidation. Chronic blunting of the left costophrenic angle. No pleural effusion or pneumothorax. Emphysema. No acute osseous abnormality. IMPRESSION: No acute cardiopulmonary findings. Emphysema.  Postsurgical changes related to left upper lobectomy. Electronically Signed   By: Hart Robinsons M.D.   On: 07/24/2023 20:53       Assessment  and Plan: Acute exacerbation of chronic obstructive pulmonary disease (COPD) (HCC) Patient had increased work of breathing in the ER, patient has had partial response to treatment with 125 mg of Solu-Medrol, magnesium sulfate as well as continuous albuterol therapy.  Patient appears much more relaxed to me currently.  Although lung exam findings still show expiratory wheezes and occasional crackles.  Patient's cough is dry.  There is no white count no fever and patient appears nontoxic.  Check respiratory viral panel.  Continue treatment with methylprednisolone 60 mg daily and DuoNebs every 4 hourly.  There is no new hypoxia at this time.  We will defer antibiotic therapy just yet.  Paroxysmal atrial fibrillation (HCC) Is a chronic diagnosis of patient, continue with apixaban.  Hypomagnesemia This is a chronic diagnosis, patient is having some fasciculations, patient has already received magnesium sulfate in the ER.  I will add on a magnesium level to see how patient is doing.  fasciculations are likely due to albuterol use and are noted in the leg calf muscles.  Chest pain, atypical Seems to be musculoskeletal as it is related to exacerbations with coughing, troponin is negative.  We will continue to monitor clinically and treat with acetaminophen.   GI ppx initiated.   Advance Care Planning:   Code Status: Prior patient wishes to be DNR/DNI, this is corroborated by her previously documented wishes.  Consults: None at this time  Family Communication: Per patient  Severity of Illness: The appropriate patient status for this patient is OBSERVATION. Observation status is judged to be reasonable and necessary in order to provide the required intensity of service to ensure the patient's safety. The patient's presenting symptoms, physical exam findings, and initial radiographic and laboratory data in the context of their medical condition is felt to place them at decreased risk for further clinical  deterioration. Furthermore, it is anticipated that the patient will be medically stable for discharge from the hospital within 2 midnights of admission.   Author: Nolberto Hanlon, MD 07/24/2023 10:32 PM  For on call review [email or web address].

## 2023-07-24 NOTE — Assessment & Plan Note (Addendum)
This is a chronic diagnosis, patient is having some fasciculations, patient has already received magnesium sulfate in the ER.  I will add on a magnesium level to see how patient is doing.  fasciculations are likely due to albuterol use and are noted in the leg calf muscles.

## 2023-07-24 NOTE — Assessment & Plan Note (Signed)
Is a chronic diagnosis of patient, continue with apixaban.

## 2023-07-24 NOTE — Assessment & Plan Note (Signed)
Seems to be musculoskeletal as it is related to exacerbations with coughing, troponin is negative.  We will continue to monitor clinically and treat with acetaminophen.

## 2023-07-25 DIAGNOSIS — Z66 Do not resuscitate: Secondary | ICD-10-CM | POA: Diagnosis present

## 2023-07-25 DIAGNOSIS — E890 Postprocedural hypothyroidism: Secondary | ICD-10-CM | POA: Diagnosis present

## 2023-07-25 DIAGNOSIS — R253 Fasciculation: Secondary | ICD-10-CM | POA: Diagnosis present

## 2023-07-25 DIAGNOSIS — I10 Essential (primary) hypertension: Secondary | ICD-10-CM | POA: Diagnosis present

## 2023-07-25 DIAGNOSIS — F319 Bipolar disorder, unspecified: Secondary | ICD-10-CM | POA: Diagnosis present

## 2023-07-25 DIAGNOSIS — I480 Paroxysmal atrial fibrillation: Secondary | ICD-10-CM | POA: Diagnosis present

## 2023-07-25 DIAGNOSIS — R0789 Other chest pain: Secondary | ICD-10-CM

## 2023-07-25 DIAGNOSIS — Z79890 Hormone replacement therapy (postmenopausal): Secondary | ICD-10-CM | POA: Diagnosis not present

## 2023-07-25 DIAGNOSIS — Z85118 Personal history of other malignant neoplasm of bronchus and lung: Secondary | ICD-10-CM

## 2023-07-25 DIAGNOSIS — E039 Hypothyroidism, unspecified: Secondary | ICD-10-CM

## 2023-07-25 DIAGNOSIS — Z902 Acquired absence of lung [part of]: Secondary | ICD-10-CM | POA: Diagnosis not present

## 2023-07-25 DIAGNOSIS — F3181 Bipolar II disorder: Secondary | ICD-10-CM | POA: Diagnosis not present

## 2023-07-25 DIAGNOSIS — R0602 Shortness of breath: Secondary | ICD-10-CM | POA: Diagnosis present

## 2023-07-25 DIAGNOSIS — R531 Weakness: Secondary | ICD-10-CM | POA: Diagnosis present

## 2023-07-25 DIAGNOSIS — Z8249 Family history of ischemic heart disease and other diseases of the circulatory system: Secondary | ICD-10-CM | POA: Diagnosis not present

## 2023-07-25 DIAGNOSIS — J9611 Chronic respiratory failure with hypoxia: Secondary | ICD-10-CM

## 2023-07-25 DIAGNOSIS — F419 Anxiety disorder, unspecified: Secondary | ICD-10-CM | POA: Diagnosis present

## 2023-07-25 DIAGNOSIS — Z7901 Long term (current) use of anticoagulants: Secondary | ICD-10-CM | POA: Diagnosis not present

## 2023-07-25 DIAGNOSIS — J441 Chronic obstructive pulmonary disease with (acute) exacerbation: Secondary | ICD-10-CM | POA: Diagnosis present

## 2023-07-25 DIAGNOSIS — F17210 Nicotine dependence, cigarettes, uncomplicated: Secondary | ICD-10-CM | POA: Diagnosis present

## 2023-07-25 DIAGNOSIS — I252 Old myocardial infarction: Secondary | ICD-10-CM | POA: Diagnosis not present

## 2023-07-25 DIAGNOSIS — Z681 Body mass index (BMI) 19 or less, adult: Secondary | ICD-10-CM | POA: Diagnosis not present

## 2023-07-25 DIAGNOSIS — Z79899 Other long term (current) drug therapy: Secondary | ICD-10-CM | POA: Diagnosis not present

## 2023-07-25 DIAGNOSIS — R636 Underweight: Secondary | ICD-10-CM | POA: Diagnosis present

## 2023-07-25 DIAGNOSIS — Z1152 Encounter for screening for COVID-19: Secondary | ICD-10-CM | POA: Diagnosis not present

## 2023-07-25 DIAGNOSIS — K219 Gastro-esophageal reflux disease without esophagitis: Secondary | ICD-10-CM | POA: Diagnosis present

## 2023-07-25 LAB — BASIC METABOLIC PANEL
Anion gap: 8 (ref 5–15)
BUN: 14 mg/dL (ref 8–23)
CO2: 27 mmol/L (ref 22–32)
Calcium: 9.2 mg/dL (ref 8.9–10.3)
Chloride: 99 mmol/L (ref 98–111)
Creatinine, Ser: 0.46 mg/dL (ref 0.44–1.00)
GFR, Estimated: >60 mL/min (ref >60)
Glucose, Bld: 154 mg/dL — ABNORMAL HIGH (ref 70–99)
Potassium: 4.5 mmol/L (ref 3.5–5.1)
Sodium: 134 mmol/L — ABNORMAL LOW (ref 135–145)

## 2023-07-25 LAB — RESPIRATORY PANEL BY PCR

## 2023-07-25 LAB — CBC
HCT: 37.3 % (ref 36.0–46.0)
Hemoglobin: 11.8 g/dL — ABNORMAL LOW (ref 12.0–15.0)
MCH: 30.2 pg (ref 26.0–34.0)
MCHC: 31.6 g/dL (ref 30.0–36.0)
MCV: 95.4 fL (ref 80.0–100.0)
Platelets: 230 10*3/uL (ref 150–400)
RBC: 3.91 MIL/uL (ref 3.87–5.11)
RDW: 13.3 % (ref 11.5–15.5)
WBC: 5.2 10*3/uL (ref 4.0–10.5)
nRBC: 0 % (ref 0.0–0.2)

## 2023-07-25 LAB — APTT: aPTT: 29 s (ref 24–36)

## 2023-07-25 LAB — TROPONIN I (HIGH SENSITIVITY): Troponin I (High Sensitivity): 5 ng/L (ref <18)

## 2023-07-25 LAB — PROTIME-INR
INR: 1.1 (ref 0.8–1.2)
Prothrombin Time: 14.4 s (ref 11.4–15.2)

## 2023-07-25 MED ORDER — ALBUTEROL SULFATE (2.5 MG/3ML) 0.083% IN NEBU
2.5000 mg | INHALATION_SOLUTION | RESPIRATORY_TRACT | Status: DC
  Administered 2023-07-25: 2.5 mg via RESPIRATORY_TRACT
  Filled 2023-07-25: qty 3

## 2023-07-25 MED ORDER — AZITHROMYCIN 250 MG PO TABS
500.0000 mg | ORAL_TABLET | Freq: Every day | ORAL | Status: DC
  Administered 2023-07-25 – 2023-07-27 (×3): 500 mg via ORAL
  Filled 2023-07-25 (×3): qty 2

## 2023-07-25 MED ORDER — ARFORMOTEROL TARTRATE 15 MCG/2ML IN NEBU
15.0000 ug | INHALATION_SOLUTION | Freq: Two times a day (BID) | RESPIRATORY_TRACT | Status: DC
  Administered 2023-07-25 – 2023-07-27 (×5): 15 ug via RESPIRATORY_TRACT
  Filled 2023-07-25 (×5): qty 2

## 2023-07-25 MED ORDER — REVEFENACIN 175 MCG/3ML IN SOLN
175.0000 ug | Freq: Every day | RESPIRATORY_TRACT | Status: DC
  Administered 2023-07-25 – 2023-07-27 (×3): 175 ug via RESPIRATORY_TRACT
  Filled 2023-07-25 (×3): qty 3

## 2023-07-25 MED ORDER — BUDESONIDE 0.5 MG/2ML IN SUSP
0.5000 mg | Freq: Two times a day (BID) | RESPIRATORY_TRACT | Status: DC
  Administered 2023-07-25 – 2023-07-27 (×5): 0.5 mg via RESPIRATORY_TRACT
  Filled 2023-07-25 (×5): qty 2

## 2023-07-25 NOTE — Progress Notes (Signed)
Mobility Specialist - Progress Note   07/25/23 1046  Oxygen Therapy  SpO2 92 %  O2 Device Nasal Cannula  O2 Flow Rate (L/min) 2 L/min  Patient Activity (if Appropriate) Ambulating  Mobility  Activity Ambulated independently in hallway  Level of Assistance Independent  Assistive Device None  Distance Ambulated (ft) 200 ft  Activity Response Tolerated well  Mobility Referral Yes  $Mobility charge 1 Mobility  Mobility Specialist Start Time (ACUTE ONLY) 1021  Mobility Specialist Stop Time (ACUTE ONLY) 1032  Mobility Specialist Time Calculation (min) (ACUTE ONLY) 11 min   Pt received in bed and agreeable to mobility. Pt required a seated rest break d/t legs feeling shaky. No complaints during session. Pt to bed after session with all needs met.    Pre-mobility: 91% SpO2 (2L [place]) During mobility: 92% SpO2 (2L [place]) Post-mobility: 93% SPO2 (2L [place])  Chief Technology Officer

## 2023-07-25 NOTE — Progress Notes (Signed)
PROGRESS NOTE  Tracy Park NFA:[id number] DOB: [birth year]   PCP: Jacquelin Hawking, PA-C  Patient is from: Home.  DOA: 07/24/2023 LOS: 0  Chief complaints No chief complaint on file.    Brief Narrative / Interim history: 67 year old F with PMH of remote lung cancer s/p LUL resection, COPD, chronic hypoxic RF on 2 L, PAF on Eliquis, HTN, anxiety, depression, bipolar disorder, and tobacco use disorder presenting with increased dry cough with associated shortness of breath and chest tightness, and admitted for COPD exacerbation.  In ED, stable vitals.  CXR, COVID-19, full RVP and serial troponin unrevealing.  Patient was started IV Solu-Medrol, nebulizers and inhalers and admitted for further care.  Subjective: Seen and examined earlier this morning.  No major events overnight of this morning.  Continues to endorse cough which is productive with off-whitish phlegm at times.  Also reports shortness of breath and chest pain with cough.  Objective: Vitals:   07/25/23 0735 07/25/23 0840 07/25/23 0940 07/25/23 1046  BP: 96/67  103/70   Pulse: 70  85   Resp: 20  20   Temp: 98 F (36.7 C)  98 F (36.7 C)   TempSrc: Oral  Oral   SpO2: 95% 95% 91% 92%  Weight:      Height:        Examination:  GENERAL: No apparent distress.  Nontoxic. HEENT: MMM.  Vision and hearing grossly intact.  NECK: Supple.  No apparent JVD.  RESP: Coughing a lot.  Some work of breathing.  Wheezing or rhonchi bilaterally. CVS:  RRR. Heart sounds normal.  ABD/GI/GU: BS+. Abd soft, NTND.  MSK/EXT:  Moves extremities. No apparent deformity. No edema.  SKIN: no apparent skin lesion or wound NEURO: Awake, alert and oriented appropriately.  No apparent focal neuro deficit. PSYCH: Calm. Normal affect.   Procedures:  None  Microbiology summarized: COVID-19, influenza and RSV PCR nonreactive  Assessment and plan: COPD with acute exacerbation: Presents with progressive cough, shortness of breath and chest  tightness.  Still with significant symptoms and some work of breathing.  Wheezing with rhonchi on auscultation.  Reports good compliance with home inhalers.  Continues to smoke cigarettes.  CXR without acute finding.  Very low suspicion for PE and CHF. -Counseled on the importance of smoking cessation -Continue IV Solu-Medrol -Change controller inhalers to nebulizers -Add Zithromax -Continue DuoNeb as needed -Discontinue losartan. -Continue PPI  Paroxysmal atrial fibrillation: Rate controlled -Continue metoprolol and Eliquis.  Her Toprol is very low to contribute to pulmonary symptoms.  Essential hypertension: Normotensive but soft. -Discontinue losartan.  BP is soft and it could contribute to cough   Hypomagnesemia -Monitor replenish as appropriate  Hypothyroidism -Continue home Synthroid  Anxiety/depression/bipolar disorder -Continue home meds  Chest pain, atypical: Likely due to recurrent cough.  Serial troponin negative. -Manage COPD as above  Tobacco use disorder: Smokes about 2 pack a day for most part of her life and recently cut down to 4 to 5 cigarettes a day. -Encouraged smoking cessation -Not interested in nicotine patch   GERD -PPI  Underweight: Likely due to chronic COPD Body mass index is 17.36 kg/m. -Consult dietitian          DVT prophylaxis:   apixaban (ELIQUIS) tablet 5 mg  Code Status: DNR/DNI Family Communication: None at bedside Level of care: Med-Surg Status is: Observation The patient will require care spanning > 2 midnights and should be moved to inpatient because: COPD exacerbation with significant symptoms   Final disposition: Home Consultants:  None  55 minutes with more than 50% spent in reviewing records, counseling patient/family and coordinating care.   Sch Meds:  Scheduled Meds:  apixaban  5 mg Oral BID   arformoterol  15 mcg Nebulization BID   azithromycin  500 mg Oral Daily   budesonide (PULMICORT) nebulizer solution   0.5 mg Nebulization BID   levothyroxine  125 mcg Oral Q0600   magnesium oxide  400 mg Oral Daily   methylPREDNISolone sodium succinate  60 mg Intravenous Q24H   metoprolol succinate  12.5 mg Oral Daily   pantoprazole  20 mg Oral Daily   revefenacin  175 mcg Nebulization Daily   rosuvastatin  20 mg Oral Daily   sertraline  25 mg Oral Daily   sodium chloride flush  3 mL Intravenous Q12H   Continuous Infusions: PRN Meds:.acetaminophen **OR** acetaminophen, ALPRAZolam, benzonatate, ipratropium-albuterol, oxyCODONE, polyethylene glycol, polyvinyl alcohol  Antimicrobials: Anti-infectives (From admission, onward)    Start     Dose/Rate Route Frequency Ordered Stop   07/25/23 1030  azithromycin (ZITHROMAX) tablet 500 mg        500 mg Oral Daily 07/25/23 0930 07/29/23 0959        I have personally reviewed the following labs and images: CBC: Recent Labs  Lab 07/24/23 1637 07/25/23 0549  WBC 8.8 5.2  HGB 13.3 11.8*  HCT 42.9 37.3  MCV 96.8 95.4  PLT 246 230   BMP &GFR Recent Labs  Lab 07/24/23 1637 07/25/23 0549  NA 139 134*  K 3.3* 4.5  CL 98 99  CO2 32 27  GLUCOSE 101* 154*  BUN 9 14  CREATININE 0.53 0.46  CALCIUM 9.0 9.2  MG 1.9  --    Estimated Creatinine Clearance: 49.3 mL/min (by C-G formula based on SCr of 0.46 mg/dL). Liver & Pancreas: No results for input(s): "AST", "ALT", "ALKPHOS", "BILITOT", "PROT", "ALBUMIN" in the last 168 hours. No results for input(s): "LIPASE", "AMYLASE" in the last 168 hours. No results for input(s): "AMMONIA" in the last 168 hours. Diabetic: No results for input(s): "HGBA1C" in the last 72 hours. No results for input(s): "GLUCAP" in the last 168 hours. Cardiac Enzymes: No results for input(s): "CKTOTAL", "CKMB", "CKMBINDEX", "TROPONINI" in the last 168 hours. No results for input(s): "PROBNP" in the last 8760 hours. Coagulation Profile: Recent Labs  Lab 07/25/23 0549  INR 1.1   Thyroid Function Tests: No results for  input(s): "TSH", "T4TOTAL", "FREET4", "T3FREE", "THYROIDAB" in the last 72 hours. Lipid Profile: No results for input(s): "CHOL", "HDL", "LDLCALC", "TRIG", "CHOLHDL", "LDLDIRECT" in the last 72 hours. Anemia Panel: No results for input(s): "VITAMINB12", "FOLATE", "FERRITIN", "TIBC", "IRON", "RETICCTPCT" in the last 72 hours. Urine analysis:    Component Value Date/Time   COLORURINE YELLOW 06/25/2023 0536   APPEARANCEUR CLEAR 06/25/2023 0536   LABSPEC 1.023 06/25/2023 0536   PHURINE 6.0 06/25/2023 0536   GLUCOSEU 50 (A) 06/25/2023 0536   HGBUR SMALL (A) 06/25/2023 0536   BILIRUBINUR NEGATIVE 06/25/2023 0536   KETONESUR NEGATIVE 06/25/2023 0536   PROTEINUR NEGATIVE 06/25/2023 0536   UROBILINOGEN 0.2 11/06/2009 1559   NITRITE NEGATIVE 06/25/2023 0536   LEUKOCYTESUR NEGATIVE 06/25/2023 0536   Sepsis Labs: Invalid input(s): "PROCALCITONIN", "LACTICIDVEN"  Microbiology: Recent Results (from the past 240 hour(s))  SARS Coronavirus 2 by RT PCR (hospital order, performed in [facility] hospital lab) *cepheid single result test* Anterior Nasal Swab     Status: None   Collection Time: 07/24/23 11:02 PM   Specimen: Anterior Nasal Swab  Result  Value Ref Range Status   SARS Coronavirus 2 by RT PCR NEGATIVE NEGATIVE Final    Comment: (NOTE) SARS-CoV-2 target nucleic acids are NOT DETECTED.  The SARS-CoV-2 RNA is generally detectable in upper and lower respiratory specimens during the acute phase of infection. The lowest concentration of SARS-CoV-2 viral copies this assay can detect is 250 copies / mL. A negative result does not preclude SARS-CoV-2 infection and should not be used as the sole basis for treatment or other patient management decisions.  A negative result may occur with improper specimen collection / handling, submission of specimen other than nasopharyngeal swab, presence of viral mutation(s) within the areas targeted by this assay, and inadequate number of viral  copies (<250 copies / mL). A negative result must be combined with clinical observations, patient history, and epidemiological information.  Fact Sheet for Patients:   RoadLapTop.co.za  Fact Sheet for Healthcare Providers: http://kim-miller.com/  This test is not yet approved or  cleared by the [place] FDA and has been authorized for detection and/or diagnosis of SARS-CoV-2 by FDA under an Emergency Use Authorization (EUA).  This EUA will remain in effect (meaning this test can be used) for the duration of the COVID-19 declaration under Section 564(b)(1) of the Act, 21 U.S.C. section 360bbb-3(b)(1), unless the authorization is terminated or revoked sooner.  Performed at [facility], [address], [place], [place] [address]   Respiratory (~20 pathogens) panel by PCR     Status: None   Collection Time: 07/24/23 11:03 PM   Specimen: Anterior Nasal Swab; Respiratory  Result Value Ref Range Status   Adenovirus NOT DETECTED NOT DETECTED Final   Coronavirus 229E NOT DETECTED NOT DETECTED Final    Comment: (NOTE) The Coronavirus on the Respiratory Panel, DOES NOT test for the novel  Coronavirus (2019 nCoV)    Coronavirus HKU1 NOT DETECTED NOT DETECTED Final   Coronavirus NL63 NOT DETECTED NOT DETECTED Final   Coronavirus OC43 NOT DETECTED NOT DETECTED Final   Metapneumovirus NOT DETECTED NOT DETECTED Final   Rhinovirus / Enterovirus NOT DETECTED NOT DETECTED Final   Influenza A NOT DETECTED NOT DETECTED Final   Influenza B NOT DETECTED NOT DETECTED Final   Parainfluenza Virus 1 NOT DETECTED NOT DETECTED Final   Parainfluenza Virus 2 NOT DETECTED NOT DETECTED Final   Parainfluenza Virus 3 NOT DETECTED NOT DETECTED Final   Parainfluenza Virus 4 NOT DETECTED NOT DETECTED Final   Respiratory Syncytial Virus NOT DETECTED NOT DETECTED Final   Bordetella pertussis NOT DETECTED NOT DETECTED Final   Bordetella  Parapertussis NOT DETECTED NOT DETECTED Final   Chlamydophila pneumoniae NOT DETECTED NOT DETECTED Final   Mycoplasma pneumoniae NOT DETECTED NOT DETECTED Final    Comment: Performed at [facility], [address] [place], [place] [address]    Radiology Studies: DG Chest 2 View  Result Date: 07/24/2023 CLINICAL DATA:  Shortness of breath.  History of lung cancer. EXAM: CHEST - 2 VIEW COMPARISON:  Chest radiograph dated June 24, 2023 FINDINGS: The heart size and mediastinal contours are unchanged. Postsurgical changes related to left upper lobectomy with left apical pleural-parenchymal scarring, volume loss and similar leftward deviation of the superior mediastinum and trachea. No focal consolidation. Chronic blunting of the left costophrenic angle. No pleural effusion or pneumothorax. Emphysema. No acute osseous abnormality. IMPRESSION: No acute cardiopulmonary findings. Emphysema. Postsurgical changes related to left upper lobectomy. Electronically Signed   By: Hart Robinsons M.D.   On: 07/24/2023 20:53  Shanetha Bradham T. Royalty Fakhouri [facility]  If 7PM-7AM, please contact night-coverage [email or web address] 07/25/2023, 11:47 AM

## 2023-07-25 NOTE — Plan of Care (Signed)
  Problem: Coping: Goal: Level of anxiety will decrease Outcome: Progressing   Problem: Pain Management: Goal: General experience of comfort will improve Outcome: Progressing   Problem: Safety: Goal: Ability to remain free from injury will improve Outcome: Progressing

## 2023-07-26 DIAGNOSIS — J441 Chronic obstructive pulmonary disease with (acute) exacerbation: Secondary | ICD-10-CM | POA: Diagnosis not present

## 2023-07-26 DIAGNOSIS — R0789 Other chest pain: Secondary | ICD-10-CM | POA: Diagnosis not present

## 2023-07-26 DIAGNOSIS — F419 Anxiety disorder, unspecified: Secondary | ICD-10-CM | POA: Diagnosis not present

## 2023-07-26 DIAGNOSIS — F3181 Bipolar II disorder: Secondary | ICD-10-CM | POA: Diagnosis not present

## 2023-07-26 NOTE — Evaluation (Signed)
Occupational Therapy Evaluation and Discharge Patient Details Name: Tracy Park MRN: [id number] DOB: 1956/07/18 Today's Date: 07/26/2023   History of Present Illness Tracy Park is a 67 y.o. female presented to [facility] ED with SOB and chest tightness. Found to have acute exacerbation of COPD. PMHx angina, anxiety, bipolar disorder, lung cancer, COPD, supplemental O2 (2L), OSA, MI, PTSD and back dx.   Clinical Impression   This 67 yo female admitted with above presents to acute OT with education completed on energy conservation techniques and handout given (5 P's) as well as a tub seat recommended to help with this too. No further OT needs, we will D/C.      If plan is discharge home, recommend the following: Assistance with cooking/housework;Assist for transportation    Functional Status Assessment  Patient has had a recent decline in their functional status and demonstrates the ability to make significant improvements in function in a reasonable and predictable amount of time. (without further need for skilled OT intervention--all education and energy conservation handout provided)  Equipment Recommendations  Tub/shower seat       Precautions / Restrictions Precautions Precaution Comments: increased SOB with minimal exertion Restrictions Weight Bearing Restrictions: No      Mobility Bed Mobility Overal bed mobility: Independent                  Transfers Overall transfer level: Independent                        Balance Overall balance assessment: Independent                                         ADL either performed or assessed with clinical judgement   ADL Overall ADL's : Modified independent                                       General ADL Comments: Increased time due to SOB; educated he on that she can wear her O2 in shower; as well as energy conservation techiniques (5 P's) that she can use with ADLs and  beyond (including purse lipped breathing)     Vision Patient Visual Report: No change from baseline              Pertinent Vitals/Pain Pain Assessment Pain Assessment: No/denies pain     Extremity/Trunk Assessment Upper Extremity Assessment Upper Extremity Assessment: Overall WFL for tasks assessed              Cognition Arousal: Alert Behavior During Therapy: WFL for tasks assessed/performed Overall Cognitive Status: Within Functional Limits for tasks assessed                                                  Home Living Family/patient expects to be discharged to:: Private residence Living Arrangements: Children Available Help at Discharge: Family;Available PRN/intermittently (dtr she just moved in with does not work) Type of Home: House Home Access: Stairs to enter Secretary/administrator of Steps: 3 Entrance Stairs-Rails: Right Home Layout: One level     Bathroom Shower/Tub: Chief Strategy Officer: Standard  Home Equipment: Cane - single point   Additional Comments: On 2 L O2 at home 24/7;      Prior Functioning/Environment Prior Level of Function : Independent/Modified Independent             Mobility Comments: pt denies using DME, but does have SPC "in case I need it" ADLs Comments: Ind with ADLs/selfcare, IADLs, home mgt, cooking, no longer drives bc she does not have a car        OT Problem List: Cardiopulmonary status limiting activity         OT Goals(Current goals can be found in the care plan section) Acute Rehab OT Goals Patient Stated Goal: to feel better         AM-PAC OT "6 Clicks" Daily Activity     Outcome Measure Help from another person eating meals?: None Help from another person taking care of personal grooming?: None Help from another person toileting, which includes using toliet, bedpan, or urinal?: None Help from another person bathing (including washing, rinsing, drying)?: None Help  from another person to put on and taking off regular upper body clothing?: None Help from another person to put on and taking off regular lower body clothing?: None 6 Click Score: 24   End of Session Equipment Utilized During Treatment: Oxygen (2 liters)  Activity Tolerance: Patient tolerated treatment well Patient left: in bed;with call bell/phone within reach  OT Visit Diagnosis: Muscle weakness (generalized) (M62.81)                Time: 2725-3664 OT Time Calculation (min): 18 min Charges:  OT General Charges $OT Visit: 1 Visit OT Evaluation $OT Eval Low Complexity: 1 Low Lindon Romp OT Acute Rehabilitation Services Office [phone]    Evette Georges 07/26/2023, 1:32 PM

## 2023-07-26 NOTE — Progress Notes (Signed)
SATURATION QUALIFICATIONS: (This note is used to comply with regulatory documentation for home oxygen)  Patient Saturations on Room Air at Rest = 90%  Patient Saturations on Room Air while Ambulating = 90%  Patient Saturations on 2 Liters of oxygen while Ambulating = 95%  Patient became short of breath after approximately 100 feet. Had to stop and rest multiple times. Heart rate increased from 87 BPM to 112 BPM.

## 2023-07-26 NOTE — Progress Notes (Signed)
PROGRESS NOTE  Tracy Park ZDG:[id number] DOB: 01-30-1956   PCP: Jacquelin Hawking, PA-C  Patient is from: Home.  DOA: 07/24/2023 LOS: 1  Chief complaints No chief complaint on file.    Brief Narrative / Interim history: 67 year old F with PMH of remote lung cancer s/p LUL resection, COPD, chronic hypoxic RF on 2 L, PAF on Eliquis, HTN, anxiety, depression, bipolar disorder, and tobacco use disorder presenting with increased dry cough with associated shortness of breath and chest tightness, and admitted for COPD exacerbation.  In ED, stable vitals.  CXR, COVID-19, full RVP and serial troponin unrevealing.  Patient was started IV Solu-Medrol, nebulizers and inhalers and admitted for further care.  Remains on IV Solu-Medrol.  Improving.  Subjective: Seen and examined earlier this morning.  Reports sleepless night due to cough.  Still with shortness of breath but better than yesterday.  Reports chest pain with cough.  Objective: Vitals:   07/25/23 2023 07/26/23 0508 07/26/23 0830 07/26/23 1519  BP: 103/64 109/68  111/72  Pulse: 80 78  78  Resp: 18 18  17   Temp: 98 F (36.7 C) 98 F (36.7 C)  98.8 F (37.1 C)  TempSrc: Oral Oral  Oral  SpO2: 96% 97% 94% 97%  Weight:      Height:        Examination:  GENERAL: No apparent distress.  Nontoxic. HEENT: MMM.  Vision and hearing grossly intact.  NECK: Supple.  No apparent JVD.  RESP: No work of breathing.  Some rhonchi bilaterally.  No wheeze. CVS:  RRR. Heart sounds normal.  ABD/GI/GU: BS+. Abd soft, NTND.  MSK/EXT:  Moves extremities. No apparent deformity. No edema.  SKIN: no apparent skin lesion or wound NEURO: Awake, alert and oriented appropriately.  No apparent focal neuro deficit. PSYCH: Calm. Normal affect.   Procedures:  None  Microbiology summarized: COVID-19, influenza and RSV PCR nonreactive  Assessment and plan: COPD with acute exacerbation: Presents with progressive cough, shortness of breath and  chest tightness. Reports good compliance with home inhalers.  Continues to smoke cigarettes.  CXR without acute finding.  Very low suspicion for PE and CHF.  Ambulated on room air and maintain saturation above 90% but felt short of breath after 100 feet.  Slowly improving. -Counseled on the importance of smoking cessation -Continue IV Solu-Medrol, Zithromax, scheduled and as needed nebulizers -Losartan discontinued. -Minimum oxygen to keep saturation above 88% regardless of home 2 L.  Discussed with patient and RN. -Continue PPI  Paroxysmal atrial fibrillation: Rate controlled -Continue metoprolol and Eliquis.  Her Toprol is very low to contribute to pulmonary symptoms.  Essential hypertension: Normotensive but soft. -Discontinue losartan.  BP is soft and it could contribute to cough   Hypomagnesemia -Monitor replenish as appropriate  Hypothyroidism -Continue home Synthroid  Anxiety/depression/bipolar disorder -Continue home meds  Chest pain, atypical: Likely due to recurrent cough.  Serial troponin negative. -Manage COPD as above  Tobacco use disorder: Smokes about 2 pack a day for most part of her life and recently cut down to 4 to 5 cigarettes a day. -Encouraged smoking cessation -Not interested in nicotine patch   GERD -PPI  Underweight: Likely due to chronic COPD Body mass index is 17.36 kg/m. -Consult dietitian          DVT prophylaxis:   apixaban (ELIQUIS) tablet 5 mg  Code Status: DNR/DNI Family Communication: None at bedside Level of care: Med-Surg Status is: Observation The patient will require care spanning > 2 midnights and should  be moved to inpatient because: COPD exacerbation with significant symptoms   Final disposition: Home Consultants:  None  55 minutes with more than 50% spent in reviewing records, counseling patient/family and coordinating care.   Sch Meds:  Scheduled Meds:  apixaban  5 mg Oral BID   arformoterol  15 mcg Nebulization  BID   azithromycin  500 mg Oral Daily   budesonide (PULMICORT) nebulizer solution  0.5 mg Nebulization BID   levothyroxine  125 mcg Oral Q0600   magnesium oxide  400 mg Oral Daily   methylPREDNISolone sodium succinate  60 mg Intravenous Q24H   metoprolol succinate  12.5 mg Oral Daily   pantoprazole  20 mg Oral Daily   revefenacin  175 mcg Nebulization Daily   rosuvastatin  20 mg Oral Daily   sertraline  25 mg Oral Daily   sodium chloride flush  3 mL Intravenous Q12H   Continuous Infusions: PRN Meds:.acetaminophen **OR** acetaminophen, ALPRAZolam, benzonatate, ipratropium-albuterol, oxyCODONE, polyethylene glycol, polyvinyl alcohol  Antimicrobials: Anti-infectives (From admission, onward)    Start     Dose/Rate Route Frequency Ordered Stop   07/25/23 1030  azithromycin (ZITHROMAX) tablet 500 mg        500 mg Oral Daily 07/25/23 0930 07/29/23 0959        I have personally reviewed the following labs and images: CBC: Recent Labs  Lab 07/24/23 1637 07/25/23 0549  WBC 8.8 5.2  HGB 13.3 11.8*  HCT 42.9 37.3  MCV 96.8 95.4  PLT 246 230   BMP &GFR Recent Labs  Lab 07/24/23 1637 07/25/23 0549  NA 139 134*  K 3.3* 4.5  CL 98 99  CO2 32 27  GLUCOSE 101* 154*  BUN 9 14  CREATININE 0.53 0.46  CALCIUM 9.0 9.2  MG 1.9  --    Estimated Creatinine Clearance: 49.3 mL/min (by C-G formula based on SCr of 0.46 mg/dL). Liver & Pancreas: No results for input(s): "AST", "ALT", "ALKPHOS", "BILITOT", "PROT", "ALBUMIN" in the last 168 hours. No results for input(s): "LIPASE", "AMYLASE" in the last 168 hours. No results for input(s): "AMMONIA" in the last 168 hours. Diabetic: No results for input(s): "HGBA1C" in the last 72 hours. No results for input(s): "GLUCAP" in the last 168 hours. Cardiac Enzymes: No results for input(s): "CKTOTAL", "CKMB", "CKMBINDEX", "TROPONINI" in the last 168 hours. No results for input(s): "PROBNP" in the last 8760 hours. Coagulation Profile: Recent  Labs  Lab 07/25/23 0549  INR 1.1   Thyroid Function Tests: No results for input(s): "TSH", "T4TOTAL", "FREET4", "T3FREE", "THYROIDAB" in the last 72 hours. Lipid Profile: No results for input(s): "CHOL", "HDL", "LDLCALC", "TRIG", "CHOLHDL", "LDLDIRECT" in the last 72 hours. Anemia Panel: No results for input(s): "VITAMINB12", "FOLATE", "FERRITIN", "TIBC", "IRON", "RETICCTPCT" in the last 72 hours. Urine analysis:    Component Value Date/Time   COLORURINE YELLOW 06/25/2023 0536   APPEARANCEUR CLEAR 06/25/2023 0536   LABSPEC 1.023 06/25/2023 0536   PHURINE 6.0 06/25/2023 0536   GLUCOSEU 50 (A) 06/25/2023 0536   HGBUR SMALL (A) 06/25/2023 0536   BILIRUBINUR NEGATIVE 06/25/2023 0536   KETONESUR NEGATIVE 06/25/2023 0536   PROTEINUR NEGATIVE 06/25/2023 0536   UROBILINOGEN 0.2 11/06/2009 1559   NITRITE NEGATIVE 06/25/2023 0536   LEUKOCYTESUR NEGATIVE 06/25/2023 0536   Sepsis Labs: Invalid input(s): "PROCALCITONIN", "LACTICIDVEN"  Microbiology: Recent Results (from the past 240 hour(s))  SARS Coronavirus 2 by RT PCR (hospital order, performed in [facility] hospital lab) *cepheid single result test* Anterior Nasal Swab  Status: None   Collection Time: 07/24/23 11:02 PM   Specimen: Anterior Nasal Swab  Result Value Ref Range Status   SARS Coronavirus 2 by RT PCR NEGATIVE NEGATIVE Final    Comment: (NOTE) SARS-CoV-2 target nucleic acids are NOT DETECTED.  The SARS-CoV-2 RNA is generally detectable in upper and lower respiratory specimens during the acute phase of infection. The lowest concentration of SARS-CoV-2 viral copies this assay can detect is 250 copies / mL. A negative result does not preclude SARS-CoV-2 infection and should not be used as the sole basis for treatment or other patient management decisions.  A negative result may occur with improper specimen collection / handling, submission of specimen other than nasopharyngeal swab, presence of viral mutation(s)  within the areas targeted by this assay, and inadequate number of viral copies (<250 copies / mL). A negative result must be combined with clinical observations, patient history, and epidemiological information.  Fact Sheet for Patients:   RoadLapTop.co.za  Fact Sheet for Healthcare Providers: http://kim-miller.com/  This test is not yet approved or  cleared by the [place] FDA and has been authorized for detection and/or diagnosis of SARS-CoV-2 by FDA under an Emergency Use Authorization (EUA).  This EUA will remain in effect (meaning this test can be used) for the duration of the COVID-19 declaration under Section 564(b)(1) of the Act, 21 U.S.C. section 360bbb-3(b)(1), unless the authorization is terminated or revoked sooner.  Performed at [facility], [address], [place], [place] [address]   Respiratory (~20 pathogens) panel by PCR     Status: None   Collection Time: 07/24/23 11:03 PM   Specimen: Anterior Nasal Swab; Respiratory  Result Value Ref Range Status   Adenovirus NOT DETECTED NOT DETECTED Final   Coronavirus 229E NOT DETECTED NOT DETECTED Final    Comment: (NOTE) The Coronavirus on the Respiratory Panel, DOES NOT test for the novel  Coronavirus (2019 nCoV)    Coronavirus HKU1 NOT DETECTED NOT DETECTED Final   Coronavirus NL63 NOT DETECTED NOT DETECTED Final   Coronavirus OC43 NOT DETECTED NOT DETECTED Final   Metapneumovirus NOT DETECTED NOT DETECTED Final   Rhinovirus / Enterovirus NOT DETECTED NOT DETECTED Final   Influenza A NOT DETECTED NOT DETECTED Final   Influenza B NOT DETECTED NOT DETECTED Final   Parainfluenza Virus 1 NOT DETECTED NOT DETECTED Final   Parainfluenza Virus 2 NOT DETECTED NOT DETECTED Final   Parainfluenza Virus 3 NOT DETECTED NOT DETECTED Final   Parainfluenza Virus 4 NOT DETECTED NOT DETECTED Final   Respiratory Syncytial Virus NOT DETECTED NOT DETECTED Final    Bordetella pertussis NOT DETECTED NOT DETECTED Final   Bordetella Parapertussis NOT DETECTED NOT DETECTED Final   Chlamydophila pneumoniae NOT DETECTED NOT DETECTED Final   Mycoplasma pneumoniae NOT DETECTED NOT DETECTED Final    Comment: Performed at [facility], [address] [place], [place] [address]    Radiology Studies: No results found.    Amarius Toto T. Nahzir Pohle [facility]  If 7PM-7AM, please contact night-coverage [email or web address] 07/26/2023, 6:18 PM

## 2023-07-26 NOTE — Evaluation (Signed)
Physical Therapy Brief Evaluation and Discharge Note Patient Details Name: Tracy Park MRN: [id number] DOB: 05/29/1956 Today's Date: 07/26/2023   History of Present Illness  Tracy Park is a 67 y.o. female presented to [facility] ED with SOB and chest tightness. Found to have acute exacerbation of COPD. PMHx angina, anxiety, bipolar disorder, lung cancer, COPD, supplemental O2 (2L), OSA, MI, PTSD and back dx.  Clinical Impression  Pt had been up in room independently and finished with OT. Pt stated she was fatigued and did not want to walk again at this time. However discussed HEP program with strengthening quads with SLR in bed and knee extensions, as well as the need for a rollator  ( 4 wheelled RW) since she fatigues and gets short of breath easily. Pt agreed and I would talk with TOC about possibilities of getting her a rollator.      Will sign off but Mobility Team will continue to follow.    PT Assessment Patient does not need any further PT services  Assistance Needed at Discharge  Intermittent Supervision/Assistance    Equipment Recommendations Rollator (4 wheels) (please assess if pt can get a rollator to assist with shortness of breath and needed rest breaks while walking)  Recommendations for Other Services       Precautions/Restrictions Precautions Precaution Comments: increased SOB with minimal exertion Restrictions Weight Bearing Restrictions: No        Mobility  Bed Mobility Rolling: Independent        Transfers Overall transfer level: Independent                      Ambulation/Gait              Home Activity Instructions    Stairs            Modified Rankin (Stroke Patients Only)        Balance                          Pertinent Vitals/Pain   Pain Assessment Pain Assessment: 0-10 Pain Score: 3  Pain Location: Left anteriro upper ribs are sore from coughing Pain Descriptors / Indicators: Sore Pain  Intervention(s): Heat applied     Home Living   Living Arrangements: Children       Home Equipment: Cane - single point   Additional Comments: On 2 L O2 at home 24/7; working with insurance to get a lighter tank for portability    Prior Function Level of Independence: Independent      UE/LE Assessment        LE ROM/Strength/Tone/Coordination: [employer]      Communication   Communication Communication: No apparent difficulties     Cognition Overall Cognitive Status: Appears within functional limits for tasks assessed/performed       General Comments      Exercises     Assessment/Plan    PT Problem List         PT Visit Diagnosis Muscle weakness (generalized) (M62.81)    No Skilled PT All education completed   Co-evaluation                AMPAC 6 Clicks Help needed turning from your back to your side while in a flat bed without using bedrails?: None Help needed moving from lying on your back to sitting on the side of a flat bed without using bedrails?: None Help needed moving to and from  a bed to a chair (including a wheelchair)?: None Help needed standing up from a chair using your arms (e.g., wheelchair or bedside chair)?: None Help needed to walk in hospital room?: None Help needed climbing 3-5 steps with a railing? : A Little 6 Click Score: 23      End of Session     Patient left: in bed Nurse Communication: Mobility status PT Visit Diagnosis: Muscle weakness (generalized) (M62.81)     Time: 1610-9604 PT Time Calculation (min) (ACUTE ONLY): 14 min  Charges:   PT Evaluation $PT Eval Low Complexity: 1 Low      Miller Edgington, PT, MPT Acute Rehabilitation Services Office: [phone] If a weekend: secure chat groups: [facility] PT, [facility] OT, [facility] SLP 07/26/2023   Zuleyma Scharf  07/26/2023, 4:50 PM

## 2023-07-26 NOTE — Plan of Care (Signed)

## 2023-07-27 DIAGNOSIS — Z85118 Personal history of other malignant neoplasm of bronchus and lung: Secondary | ICD-10-CM | POA: Diagnosis not present

## 2023-07-27 DIAGNOSIS — F419 Anxiety disorder, unspecified: Secondary | ICD-10-CM | POA: Diagnosis not present

## 2023-07-27 DIAGNOSIS — E039 Hypothyroidism, unspecified: Secondary | ICD-10-CM | POA: Diagnosis not present

## 2023-07-27 DIAGNOSIS — J441 Chronic obstructive pulmonary disease with (acute) exacerbation: Secondary | ICD-10-CM | POA: Diagnosis not present

## 2023-07-27 MED ORDER — PREDNISONE 50 MG PO TABS: 50.0000 mg | ORAL_TABLET | Freq: Every day | ORAL | 0 refills | Status: DC

## 2023-07-27 NOTE — Plan of Care (Signed)

## 2023-07-27 NOTE — TOC Transition Note (Deleted)
Transition of Care [facility]) - CM/SW Discharge Note  Patient Details  Name: Tracy Park MRN: [id number] Date of Birth: 05-15-1956  [facility] CM/SW Contact:  Ewing Schlein, LCSW Phone Number: 07/27/2023, 12:02 PM  Clinical Narrative: PT evaluation recommended a rollator at discharge. CSW spoke with patient regarding recommendation. Patient agreeable to having [employer] deliver a rollator to her hospital room prior to discharge today. Patient confirmed she receives home oxygen through [facility] and has a travel tank, so she will not need one at discharge. CSW made DME referral to [facility] with [employer], which was accepted. [employer] to deliver rollator to patient's room. TOC signing off.  Final next level of care: Home/Self Care Barriers to Discharge: Barriers Resolved  Patient Goals and CMS Choice CMS Medicare.gov Compare Post Acute Care list provided to:: Patient Choice offered to / list presented to : Patient  Discharge Plan and Services Additional resources added to the After Visit Summary for   Post Acute Care Choice: Durable Medical Equipment          DME Arranged: Walker rolling with seat DME Agency: [employer] Date DME Agency Contacted: 07/27/23 Time DME Agency Contacted: 1116 Representative spoke with at DME Agency: Selena Batten  Social Determinants of Health (SDOH) Interventions SDOH Screenings   Food Insecurity: No Food Insecurity (07/25/2023)  Recent Concern: Food Insecurity - Food Insecurity Present (06/20/2023)  Housing: Patient Declined (07/25/2023)  Transportation Needs: No Transportation Needs (07/25/2023)  Utilities: Not At Risk (07/25/2023)  Financial Resource Strain: Medium Risk (04/07/2023)   Received from [facility]  Physical Activity: Unknown (04/07/2023)   Received from [facility]  Social Connections: Moderately Integrated (04/07/2023)   Received from [facility]  Stress: Stress Concern Present (04/07/2023)   Received from [facility]  Tobacco Use: High  Risk (07/24/2023)   Readmission Risk Interventions    04/29/2023    2:02 PM 04/17/2023    4:34 PM 04/13/2023    6:12 PM  Readmission Risk Prevention Plan  Transportation Screening Complete Complete Complete  Medication Review Oceanographer) Complete Complete Complete  PCP or Specialist appointment within 3-5 days of discharge Complete Complete Complete  HRI or Home Care Consult Complete Complete Complete  SW Recovery Care/Counseling Consult Complete Complete Complete  Palliative Care Screening Not Applicable Not Applicable Not Applicable  Skilled Nursing Facility Not Applicable Not Applicable Not Applicable

## 2023-07-27 NOTE — TOC Transition Note (Signed)
Transition of Care [facility]) - CM/SW Discharge Note  Patient Details  Name: Tracy Park MRN: [id number] Date of Birth: 05-20-1956  Transition of Care [facility]) CM/SW Contact:  Ewing Schlein, LCSW Phone Number: 07/27/2023, 12:06 PM  Clinical Narrative: PT evaluation recommended a rollator at discharge. CSW spoke with patient regarding recommendation. Patient agreeable to having [employer] deliver a rollator to her hospital room prior to discharge today. Patient confirmed she receives home oxygen through [facility] and has a travel tank, so she will not need one at discharge. CSW made DME referral to [employer] with [employer], which was accepted. [employer] to deliver rollator to patient's room. TOC signing off.    Final next level of care: Home/Self Care Barriers to Discharge: Barriers Resolved  Patient Goals and CMS Choice CMS Medicare.gov Compare Post Acute Care list provided to:: Patient Choice offered to / list presented to : Patient  Discharge Plan and Services Additional resources added to the After Visit Summary for   Post Acute Care Choice: Durable Medical Equipment          DME Arranged: Walker rolling with seat DME Agency: [employer] Date DME Agency Contacted: 07/27/23 Time DME Agency Contacted: 1116 Representative spoke with at DME Agency: Selena Batten  Social Determinants of Health (SDOH) Interventions SDOH Screenings   Food Insecurity: No Food Insecurity (07/25/2023)  Recent Concern: Food Insecurity - Food Insecurity Present (06/20/2023)  Housing: Patient Declined (07/25/2023)  Transportation Needs: No Transportation Needs (07/25/2023)  Utilities: Not At Risk (07/25/2023)  Financial Resource Strain: Medium Risk (04/07/2023)   Received from [facility]  Physical Activity: Unknown (04/07/2023)   Received from [facility]  Social Connections: Moderately Integrated (04/07/2023)   Received from [facility]  Stress: Stress Concern Present (04/07/2023)   Received from [facility]  Tobacco Use: High  Risk (07/24/2023)   Readmission Risk Interventions    07/27/2023   12:06 PM 04/29/2023    2:02 PM 04/17/2023    4:34 PM  Readmission Risk Prevention Plan  Transportation Screening Complete Complete Complete  Medication Review Oceanographer) Complete Complete Complete  PCP or Specialist appointment within 3-5 days of discharge  Complete Complete  HRI or Home Care Consult Complete Complete Complete  SW Recovery Care/Counseling Consult Complete Complete Complete  Palliative Care Screening Not Applicable Not Applicable Not Applicable  Skilled Nursing Facility Not Applicable Not Applicable Not Applicable

## 2023-07-28 NOTE — Discharge Summary (Signed)
Physician Discharge Summary  Tracy Park ZYS:[id number] DOB: [birth year] DOA: 07/24/2023  PCP: Jacquelin Hawking, PA-C  Admit date: 07/24/2023 Discharge date: 07/28/2023 Admitted From: Home Disposition: Home Recommendations for Outpatient Follow-up:  Follow up with PCP in 1 week Check CMP and CBC at follow-up Please follow up on the following pending results: None  Home Health: Not indicated Equipment/Devices: Rollator  Discharge Condition: Stable CODE STATUS: Full code  Follow-up Information     Jacquelin Hawking, PA-C. Schedule an appointment as soon as possible for a visit in 1 week(s).   Specialty: Physician Assistant Contact information: [address] [place] [address] [phone]                 Hospital course 67 year old F with PMH of remote lung cancer s/p LUL resection, COPD, chronic hypoxic RF on 2 L, PAF on Eliquis, HTN, anxiety, depression, bipolar disorder, and tobacco use disorder presenting with increased dry cough with associated shortness of breath and chest tightness, and admitted for COPD exacerbation.  In ED, stable vitals.  CXR, COVID-19, full RVP and serial troponin unrevealing.  Patient was started IV Solu-Medrol, nebulizers and inhalers and admitted for further care.   Patient continued on IV Solu-Medrol.  Azithromycin added the next day.  Also changed long-acting inhalers to nebulizers.  Eventually, patient's symptoms improved tremendously.  She was actually able to maintain saturation above 90% with ambulation on room air.  She is discharged on p.o. prednisone for 2 more days to complete a total of 5 days course.  She completed 3 days of Zithromax in house.  She is already on Friendly.  She has rescue inhalers/nebulizers.  Counseled on the importance of minimal oxygen use to keep saturation above 89%.  Also counseled on the importance of being cessation.  See individual problem list below for more.   Problems addressed  during this hospitalization COPD with acute exacerbation: Improved.  Maintain saturation above 90% with ambulation on room air.    Paroxysmal atrial fibrillation: Rate controlled -Continue metoprolol and Eliquis.   Essential hypertension: Normotensive but soft. -Discontinued losartan.  BP is soft and losartan could potentially contribute to cough   Hypomagnesemia: Resolved   Hypothyroidism -Continue home Synthroid   Anxiety/depression/bipolar disorder -Continue home meds   Chest pain, atypical: Likely due to recurrent cough.  Serial troponin negative.    Tobacco use disorder: Smokes about 2 pack a day for most part of her life and recently cut down to 4 to 5 cigs a day. -Encouraged smoking cessation   GERD  Generalized weakness: Likely due to advanced -Rollator ordered as recommended by therapy   Underweight: Likely due to chronic COPD BMI 17.36.                    Time spent 35 minutes  Vital signs Vitals:   07/26/23 2040 07/27/23 0549 07/27/23 0832 07/27/23 1257  BP: 101/73 107/75  102/66  Pulse: 75 66  63  Temp: 98.4 F (36.9 C) 98 F (36.7 C)  98.2 F (36.8 C)  Resp: 18 20  20   Height:      Weight:      SpO2: 96% 99% 95% 97%  TempSrc: Oral Oral  Oral  BMI (Calculated):         Discharge exam  GENERAL: No apparent distress.  Nontoxic. HEENT: MMM.  Vision and hearing grossly intact.  NECK: Supple.  No apparent JVD.  RESP:  No IWOB.  Fair aeration bilaterally.  CVS:  RRR. Heart sounds normal.  ABD/GI/GU: BS+. Abd soft, NTND.  MSK/EXT:  Moves extremities. No apparent deformity. No edema.  SKIN: no apparent skin lesion or wound NEURO: Awake and alert. Oriented appropriately.  No apparent focal neuro deficit. PSYCH: Calm. Normal affect.   Discharge Instructions Discharge Instructions     Diet - low sodium heart healthy   Complete by: As directed    Discharge instructions   Complete by: As directed    It has been a pleasure taking care of  you!  You were hospitalized due to COPD exacerbation likely from smoking cigarette.  You have been treated with steroid and breathing treatments.  Your symptoms improved.  We are discharging a more steroid to complete treatment course.  Please use your medications as prescribed.  We strongly recommend you quit smoking cigarette.  We also recommend using minimum oxygen to keep your oxygen saturation above 89%.  Too much oxygen is not good with COPD.  It is important that you quit smoking cigarettes.  You may use nicotine patch to help you quit smoking.  Nicotine patch is available over-the-counter.  You may also discuss other options to help you quit smoking with your primary care doctor. You can also talk to professional counselors at 1-800-QUIT-NOW ([phone] 558-0759) for free smoking cessation counseling.     Take care,   Increase activity slowly   Complete by: As directed       Allergies as of 07/27/2023       Reactions   Red Dye #40 (allura Red) Hives, Itching, Other (See Comments)   Red food dye   Strawberry Extract Hives, Itching   Tomato Hives, Itching   Aspirin Hives   Tape Rash, Other (See Comments)   Prefers paper tape   Wound Dressing Adhesive Rash        Medication List     STOP taking these medications    losartan 25 MG tablet Commonly known as: COZAAR       TAKE these medications    albuterol 108 (90 Base) MCG/ACT inhaler Commonly known as: VENTOLIN HFA Inhale 2 puffs into the lungs every 6 (six) hours as needed for wheezing or shortness of breath. What changed: when to take this   albuterol (2.5 MG/3ML) 0.083% nebulizer solution Commonly known as: PROVENTIL Take 3 mLs (2.5 mg total) by nebulization every 4 (four) hours while awake for 3 days, THEN 3 mLs (2.5 mg total) every 4 (four) hours as needed for wheezing or shortness of breath. Start taking on: June 26, 2023 What changed: Another medication with the same name was changed. Make sure you  understand how and when to take each.   ALPRAZolam 0.5 MG tablet Commonly known as: XANAX Take 0.5 mg by mouth in the morning and at bedtime.   benzonatate 100 MG capsule Commonly known as: Tessalon Perles Take 1 capsule (100 mg total) by mouth every 6 (six) hours as needed for cough.   Breztri Aerosphere 160-9-4.8 MCG/ACT Aero Generic drug: Budeson-Glycopyrrol-Formoterol Inhale 1 puff into the lungs in the morning and at bedtime.   Eliquis 5 MG Tabs tablet Generic drug: apixaban Take 1 tablet (5 mg total) by mouth 2 (two) times daily.   levothyroxine 125 MCG tablet Commonly known as: SYNTHROID Take 125 mcg by mouth daily before breakfast.   magnesium oxide 400 (240 Mg) MG tablet Commonly known as: MAG-OX Take 400 mg by mouth in the morning.   metoprolol succinate 25 MG 24 hr tablet Commonly known  as: TOPROL-XL Take 0.5 tablets (12.5 mg total) by mouth daily.   nitroGLYCERIN 0.4 MG SL tablet Commonly known as: NITROSTAT Place 0.4 mg under the tongue every 5 (five) minutes as needed for chest pain.   oxyCODONE 5 MG immediate release tablet Commonly known as: Roxicodone Take 1 tablet (5 mg total) by mouth every 8 (eight) hours as needed for severe pain (pain score 7-10).   OXYGEN Inhale 2 L/min into the lungs continuous.   predniSONE 50 MG tablet Commonly known as: DELTASONE Take 1 tablet (50 mg total) by mouth daily with breakfast. What changed:  medication strength how much to take how to take this when to take this additional instructions   rosuvastatin 20 MG tablet Commonly known as: CRESTOR Take 20 mg by mouth daily.   sertraline 25 MG tablet Commonly known as: ZOLOFT Take 25 mg by mouth in the morning.   Systane Complete PF 0.6 % Soln Generic drug: Propylene Glycol (PF) Place 1 drop into both eyes 3 (three) times daily as needed (for dryness).   Tylenol 8 Hour 650 MG CR tablet Generic drug: acetaminophen Take 650-1,300 mg by mouth 2 (two) times  daily as needed for pain.        Consultations: None  Procedures/Studies:   DG Chest 2 View  Result Date: 07/24/2023 CLINICAL DATA:  Shortness of breath.  History of lung cancer. EXAM: CHEST - 2 VIEW COMPARISON:  Chest radiograph dated June 24, 2023 FINDINGS: The heart size and mediastinal contours are unchanged. Postsurgical changes related to left upper lobectomy with left apical pleural-parenchymal scarring, volume loss and similar leftward deviation of the superior mediastinum and trachea. No focal consolidation. Chronic blunting of the left costophrenic angle. No pleural effusion or pneumothorax. Emphysema. No acute osseous abnormality. IMPRESSION: No acute cardiopulmonary findings. Emphysema. Postsurgical changes related to left upper lobectomy. Electronically Signed   By: Hart Robinsons M.D.   On: 07/24/2023 20:53       The results of significant diagnostics from this hospitalization (including imaging, microbiology, ancillary and laboratory) are listed below for reference.     Microbiology: Recent Results (from the past 240 hour(s))  SARS Coronavirus 2 by RT PCR (hospital order, performed in [facility] hospital lab) *cepheid single result test* Anterior Nasal Swab     Status: None   Collection Time: 07/24/23 11:02 PM   Specimen: Anterior Nasal Swab  Result Value Ref Range Status   SARS Coronavirus 2 by RT PCR NEGATIVE NEGATIVE Final    Comment: (NOTE) SARS-CoV-2 target nucleic acids are NOT DETECTED.  The SARS-CoV-2 RNA is generally detectable in upper and lower respiratory specimens during the acute phase of infection. The lowest concentration of SARS-CoV-2 viral copies this assay can detect is 250 copies / mL. A negative result does not preclude SARS-CoV-2 infection and should not be used as the sole basis for treatment or other patient management decisions.  A negative result may occur with improper specimen collection / handling, submission of specimen  other than nasopharyngeal swab, presence of viral mutation(s) within the areas targeted by this assay, and inadequate number of viral copies (<250 copies / mL). A negative result must be combined with clinical observations, patient history, and epidemiological information.  Fact Sheet for Patients:   RoadLapTop.co.za  Fact Sheet for Healthcare Providers: http://kim-miller.com/  This test is not yet approved or  cleared by the [place] FDA and has been authorized for detection and/or diagnosis of SARS-CoV-2 by FDA under an Emergency Use Authorization (EUA).  This EUA will remain in effect (meaning this test can be used) for the duration of the COVID-19 declaration under Section 564(b)(1) of the Act, 21 U.S.C. section 360bbb-3(b)(1), unless the authorization is terminated or revoked sooner.  Performed at [facility], [address], [place], [place] [address]   Respiratory (~20 pathogens) panel by PCR     Status: None   Collection Time: 07/24/23 11:03 PM   Specimen: Anterior Nasal Swab; Respiratory  Result Value Ref Range Status   Adenovirus NOT DETECTED NOT DETECTED Final   Coronavirus 229E NOT DETECTED NOT DETECTED Final    Comment: (NOTE) The Coronavirus on the Respiratory Panel, DOES NOT test for the novel  Coronavirus (2019 nCoV)    Coronavirus HKU1 NOT DETECTED NOT DETECTED Final   Coronavirus NL63 NOT DETECTED NOT DETECTED Final   Coronavirus OC43 NOT DETECTED NOT DETECTED Final   Metapneumovirus NOT DETECTED NOT DETECTED Final   Rhinovirus / Enterovirus NOT DETECTED NOT DETECTED Final   Influenza A NOT DETECTED NOT DETECTED Final   Influenza B NOT DETECTED NOT DETECTED Final   Parainfluenza Virus 1 NOT DETECTED NOT DETECTED Final   Parainfluenza Virus 2 NOT DETECTED NOT DETECTED Final   Parainfluenza Virus 3 NOT DETECTED NOT DETECTED Final   Parainfluenza Virus 4 NOT DETECTED NOT DETECTED Final    Respiratory Syncytial Virus NOT DETECTED NOT DETECTED Final   Bordetella pertussis NOT DETECTED NOT DETECTED Final   Bordetella Parapertussis NOT DETECTED NOT DETECTED Final   Chlamydophila pneumoniae NOT DETECTED NOT DETECTED Final   Mycoplasma pneumoniae NOT DETECTED NOT DETECTED Final    Comment: Performed at [facility], [address] [place], [place] [address]     Labs:  CBC: Recent Labs  Lab 07/24/23 1637 07/25/23 0549  WBC 8.8 5.2  HGB 13.3 11.8*  HCT 42.9 37.3  MCV 96.8 95.4  PLT 246 230   BMP &GFR Recent Labs  Lab 07/24/23 1637 07/25/23 0549  NA 139 134*  K 3.3* 4.5  CL 98 99  CO2 32 27  GLUCOSE 101* 154*  BUN 9 14  CREATININE 0.53 0.46  CALCIUM 9.0 9.2  MG 1.9  --    Estimated Creatinine Clearance: 49.3 mL/min (by C-G formula based on SCr of 0.46 mg/dL). Liver & Pancreas: No results for input(s): "AST", "ALT", "ALKPHOS", "BILITOT", "PROT", "ALBUMIN" in the last 168 hours. No results for input(s): "LIPASE", "AMYLASE" in the last 168 hours. No results for input(s): "AMMONIA" in the last 168 hours. Diabetic: No results for input(s): "HGBA1C" in the last 72 hours. No results for input(s): "GLUCAP" in the last 168 hours. Cardiac Enzymes: No results for input(s): "CKTOTAL", "CKMB", "CKMBINDEX", "TROPONINI" in the last 168 hours. No results for input(s): "PROBNP" in the last 8760 hours. Coagulation Profile: Recent Labs  Lab 07/25/23 0549  INR 1.1   Thyroid Function Tests: No results for input(s): "TSH", "T4TOTAL", "FREET4", "T3FREE", "THYROIDAB" in the last 72 hours. Lipid Profile: No results for input(s): "CHOL", "HDL", "LDLCALC", "TRIG", "CHOLHDL", "LDLDIRECT" in the last 72 hours. Anemia Panel: No results for input(s): "VITAMINB12", "FOLATE", "FERRITIN", "TIBC", "IRON", "RETICCTPCT" in the last 72 hours. Urine analysis:    Component Value Date/Time   COLORURINE YELLOW 06/25/2023 0536   APPEARANCEUR CLEAR 06/25/2023 0536   LABSPEC 1.023  06/25/2023 0536   PHURINE 6.0 06/25/2023 0536   GLUCOSEU 50 (A) 06/25/2023 0536   HGBUR SMALL (A) 06/25/2023 0536   BILIRUBINUR NEGATIVE 06/25/2023 0536   KETONESUR NEGATIVE 06/25/2023 0536  PROTEINUR NEGATIVE 06/25/2023 0536   UROBILINOGEN 0.2 11/06/2009 1559   NITRITE NEGATIVE 06/25/2023 0536   LEUKOCYTESUR NEGATIVE 06/25/2023 0536   Sepsis Labs: Invalid input(s): "PROCALCITONIN", "LACTICIDVEN"   SIGNED:  Almon Hercules, MD  [facility] 07/28/2023, 10:53 PM

## 2023-08-03 ENCOUNTER — Inpatient Hospital Stay (HOSPITAL_COMMUNITY)
Admission: EM | Admit: 2023-08-03 | Discharge: 2023-08-09 | DRG: 191 | Disposition: A | Discharge status: Home / Self Care | Payer: 59 | Attending: Internal Medicine | Admitting: Internal Medicine

## 2023-08-03 ENCOUNTER — Emergency Department (HOSPITAL_COMMUNITY): Payer: 59

## 2023-08-03 ENCOUNTER — Inpatient Hospital Stay (HOSPITAL_COMMUNITY): Payer: 59

## 2023-08-03 ENCOUNTER — Encounter (HOSPITAL_COMMUNITY): Payer: Self-pay

## 2023-08-03 ENCOUNTER — Other Ambulatory Visit: Payer: Self-pay

## 2023-08-03 DIAGNOSIS — R079 Chest pain, unspecified: Secondary | ICD-10-CM | POA: Insufficient documentation

## 2023-08-03 DIAGNOSIS — Z85118 Personal history of other malignant neoplasm of bronchus and lung: Secondary | ICD-10-CM

## 2023-08-03 DIAGNOSIS — Z66 Do not resuscitate: Secondary | ICD-10-CM | POA: Diagnosis present

## 2023-08-03 DIAGNOSIS — Z902 Acquired absence of lung [part of]: Secondary | ICD-10-CM | POA: Diagnosis not present

## 2023-08-03 DIAGNOSIS — Z7951 Long term (current) use of inhaled steroids: Secondary | ICD-10-CM

## 2023-08-03 DIAGNOSIS — F319 Bipolar disorder, unspecified: Secondary | ICD-10-CM | POA: Diagnosis present

## 2023-08-03 DIAGNOSIS — I480 Paroxysmal atrial fibrillation: Secondary | ICD-10-CM

## 2023-08-03 DIAGNOSIS — Z8659 Personal history of other mental and behavioral disorders: Secondary | ICD-10-CM

## 2023-08-03 DIAGNOSIS — J9621 Acute and chronic respiratory failure with hypoxia: Secondary | ICD-10-CM | POA: Diagnosis present

## 2023-08-03 DIAGNOSIS — Z886 Allergy status to analgesic agent status: Secondary | ICD-10-CM

## 2023-08-03 DIAGNOSIS — Z9981 Dependence on supplemental oxygen: Secondary | ICD-10-CM

## 2023-08-03 DIAGNOSIS — Z79890 Hormone replacement therapy (postmenopausal): Secondary | ICD-10-CM | POA: Diagnosis not present

## 2023-08-03 DIAGNOSIS — Z8249 Family history of ischemic heart disease and other diseases of the circulatory system: Secondary | ICD-10-CM | POA: Diagnosis not present

## 2023-08-03 DIAGNOSIS — J9611 Chronic respiratory failure with hypoxia: Secondary | ICD-10-CM | POA: Diagnosis present

## 2023-08-03 DIAGNOSIS — R54 Age-related physical debility: Secondary | ICD-10-CM | POA: Diagnosis present

## 2023-08-03 DIAGNOSIS — Z91048 Other nonmedicinal substance allergy status: Secondary | ICD-10-CM

## 2023-08-03 DIAGNOSIS — J439 Emphysema, unspecified: Secondary | ICD-10-CM | POA: Diagnosis present

## 2023-08-03 DIAGNOSIS — I252 Old myocardial infarction: Secondary | ICD-10-CM | POA: Diagnosis not present

## 2023-08-03 DIAGNOSIS — F411 Generalized anxiety disorder: Secondary | ICD-10-CM

## 2023-08-03 DIAGNOSIS — Z79899 Other long term (current) drug therapy: Secondary | ICD-10-CM | POA: Diagnosis not present

## 2023-08-03 DIAGNOSIS — E039 Hypothyroidism, unspecified: Secondary | ICD-10-CM

## 2023-08-03 DIAGNOSIS — J441 Chronic obstructive pulmonary disease with (acute) exacerbation: Principal | ICD-10-CM

## 2023-08-03 DIAGNOSIS — F17210 Nicotine dependence, cigarettes, uncomplicated: Secondary | ICD-10-CM | POA: Diagnosis present

## 2023-08-03 DIAGNOSIS — Z9221 Personal history of antineoplastic chemotherapy: Secondary | ICD-10-CM

## 2023-08-03 DIAGNOSIS — Z888 Allergy status to other drugs, medicaments and biological substances status: Secondary | ICD-10-CM

## 2023-08-03 DIAGNOSIS — Z7952 Long term (current) use of systemic steroids: Secondary | ICD-10-CM

## 2023-08-03 DIAGNOSIS — Z91041 Radiographic dye allergy status: Secondary | ICD-10-CM

## 2023-08-03 DIAGNOSIS — I10 Essential (primary) hypertension: Secondary | ICD-10-CM | POA: Diagnosis present

## 2023-08-03 DIAGNOSIS — E785 Hyperlipidemia, unspecified: Secondary | ICD-10-CM

## 2023-08-03 DIAGNOSIS — Z833 Family history of diabetes mellitus: Secondary | ICD-10-CM

## 2023-08-03 DIAGNOSIS — I2510 Atherosclerotic heart disease of native coronary artery without angina pectoris: Secondary | ICD-10-CM | POA: Diagnosis present

## 2023-08-03 DIAGNOSIS — F4310 Post-traumatic stress disorder, unspecified: Secondary | ICD-10-CM | POA: Diagnosis present

## 2023-08-03 DIAGNOSIS — Z7901 Long term (current) use of anticoagulants: Secondary | ICD-10-CM | POA: Diagnosis not present

## 2023-08-03 DIAGNOSIS — Z9102 Food additives allergy status: Secondary | ICD-10-CM

## 2023-08-03 DIAGNOSIS — F32A Depression, unspecified: Secondary | ICD-10-CM | POA: Diagnosis present

## 2023-08-03 HISTORY — DX: Personal history of other mental and behavioral disorders: Z86.59

## 2023-08-03 LAB — RESP PANEL BY RT-PCR (RSV, FLU A&B, COVID)  RVPGX2
Influenza A by PCR: NEGATIVE
Influenza B by PCR: NEGATIVE
Resp Syncytial Virus by PCR: NEGATIVE
SARS Coronavirus 2 by RT PCR: NEGATIVE

## 2023-08-03 LAB — CBC WITH DIFFERENTIAL/PLATELET
Abs Immature Granulocytes: 0.04 10*3/uL (ref 0.00–0.07)
Basophils Absolute: 0 10*3/uL (ref 0.0–0.1)
Basophils Relative: 0 %
Eosinophils Absolute: 0.1 10*3/uL (ref 0.0–0.5)
Eosinophils Relative: 1 %
HCT: 40.7 % (ref 36.0–46.0)
Hemoglobin: 13 g/dL (ref 12.0–15.0)
Immature Granulocytes: 0 %
Lymphocytes Relative: 27 %
Lymphs Abs: 3.2 10*3/uL (ref 0.7–4.0)
MCH: 30.9 pg (ref 26.0–34.0)
MCHC: 31.9 g/dL (ref 30.0–36.0)
MCV: 96.7 fL (ref 80.0–100.0)
Monocytes Absolute: 1.1 10*3/uL — ABNORMAL HIGH (ref 0.1–1.0)
Monocytes Relative: 9 %
Neutro Abs: 7.4 10*3/uL (ref 1.7–7.7)
Neutrophils Relative %: 63 %
Platelets: 263 10*3/uL (ref 150–400)
RBC: 4.21 MIL/uL (ref 3.87–5.11)
RDW: 12.7 % (ref 11.5–15.5)
WBC: 11.8 10*3/uL — ABNORMAL HIGH (ref 4.0–10.5)
nRBC: 0 % (ref 0.0–0.2)

## 2023-08-03 LAB — COMPREHENSIVE METABOLIC PANEL
ALT: 15 U/L (ref 0–44)
AST: 14 U/L — ABNORMAL LOW (ref 15–41)
Albumin: 3.6 g/dL (ref 3.5–5.0)
Alkaline Phosphatase: 70 U/L (ref 38–126)
Anion gap: 8 (ref 5–15)
BUN: 20 mg/dL (ref 8–23)
CO2: 33 mmol/L — ABNORMAL HIGH (ref 22–32)
Calcium: 9 mg/dL (ref 8.9–10.3)
Chloride: 99 mmol/L (ref 98–111)
Creatinine, Ser: 0.57 mg/dL (ref 0.44–1.00)
GFR, Estimated: >60 mL/min (ref >60)
Glucose, Bld: 99 mg/dL (ref 70–99)
Potassium: 3.9 mmol/L (ref 3.5–5.1)
Sodium: 140 mmol/L (ref 135–145)
Total Bilirubin: 0.3 mg/dL (ref <1.2)
Total Protein: 6.7 g/dL (ref 6.5–8.1)

## 2023-08-03 LAB — TROPONIN I (HIGH SENSITIVITY)
Troponin I (High Sensitivity): 8 ng/L (ref <18)
Troponin I (High Sensitivity): 8 ng/L (ref <18)

## 2023-08-03 LAB — BRAIN NATRIURETIC PEPTIDE: B Natriuretic Peptide: 68.2 pg/mL (ref 0.0–100.0)

## 2023-08-03 MED ORDER — ONDANSETRON HCL 4 MG PO TABS: 4.0000 mg | ORAL_TABLET | Freq: Four times a day (QID) | ORAL | Status: DC | PRN

## 2023-08-03 MED ORDER — METHYLPREDNISOLONE SODIUM SUCC 125 MG IJ SOLR
125.0000 mg | Freq: Once | INTRAMUSCULAR | Status: AC
  Administered 2023-08-03: 125 mg via INTRAVENOUS
  Filled 2023-08-03: qty 2

## 2023-08-03 MED ORDER — ACETAMINOPHEN 325 MG PO TABS
650.0000 mg | ORAL_TABLET | Freq: Four times a day (QID) | ORAL | Status: DC | PRN
  Administered 2023-08-06 – 2023-08-08 (×2): 650 mg via ORAL
  Filled 2023-08-03 (×2): qty 2

## 2023-08-03 MED ORDER — IPRATROPIUM BROMIDE 0.02 % IN SOLN
0.5000 mg | Freq: Four times a day (QID) | RESPIRATORY_TRACT | Status: DC
  Administered 2023-08-04: 0.5 mg via RESPIRATORY_TRACT
  Filled 2023-08-03: qty 2.5

## 2023-08-03 MED ORDER — METHYLPREDNISOLONE SODIUM SUCC 125 MG IJ SOLR
60.0000 mg | Freq: Every day | INTRAMUSCULAR | Status: AC
  Administered 2023-08-04 – 2023-08-07 (×4): 60 mg via INTRAVENOUS
  Filled 2023-08-03 (×4): qty 2

## 2023-08-03 MED ORDER — HYDROMORPHONE HCL 1 MG/ML IJ SOLN
1.0000 mg | Freq: Once | INTRAMUSCULAR | Status: AC
Start: 2023-08-03 — End: 2023-08-03
  Administered 2023-08-03: 1 mg via INTRAVENOUS
  Filled 2023-08-03: qty 1

## 2023-08-03 MED ORDER — BISACODYL 5 MG PO TBEC: 5.0000 mg | DELAYED_RELEASE_TABLET | Freq: Every day | ORAL | Status: DC | PRN

## 2023-08-03 MED ORDER — ONDANSETRON HCL 4 MG/2ML IJ SOLN: 4.0000 mg | Freq: Four times a day (QID) | INTRAMUSCULAR | Status: DC | PRN

## 2023-08-03 MED ORDER — SODIUM CHLORIDE 0.9% FLUSH: 3.0000 mL | INTRAVENOUS | Status: DC | PRN

## 2023-08-03 MED ORDER — ACETAMINOPHEN 650 MG RE SUPP: 650.0000 mg | Freq: Four times a day (QID) | RECTAL | Status: DC | PRN

## 2023-08-03 MED ORDER — SERTRALINE HCL 25 MG PO TABS
25.0000 mg | ORAL_TABLET | Freq: Every morning | ORAL | Status: DC
  Administered 2023-08-04 – 2023-08-09 (×6): 25 mg via ORAL
  Filled 2023-08-03 (×6): qty 1

## 2023-08-03 MED ORDER — PANTOPRAZOLE SODIUM 40 MG PO TBEC
40.0000 mg | DELAYED_RELEASE_TABLET | Freq: Every day | ORAL | Status: DC
  Administered 2023-08-03 – 2023-08-09 (×7): 40 mg via ORAL
  Filled 2023-08-03 (×7): qty 1

## 2023-08-03 MED ORDER — LEVALBUTEROL HCL 0.63 MG/3ML IN NEBU
0.6300 mg | INHALATION_SOLUTION | Freq: Four times a day (QID) | RESPIRATORY_TRACT | Status: DC
  Administered 2023-08-04: 0.63 mg via RESPIRATORY_TRACT
  Filled 2023-08-03: qty 3

## 2023-08-03 MED ORDER — SODIUM CHLORIDE 0.9% FLUSH
3.0000 mL | Freq: Two times a day (BID) | INTRAVENOUS | Status: DC
  Administered 2023-08-03 – 2023-08-09 (×12): 3 mL via INTRAVENOUS

## 2023-08-03 MED ORDER — IPRATROPIUM-ALBUTEROL 0.5-2.5 (3) MG/3ML IN SOLN
3.0000 mL | Freq: Once | RESPIRATORY_TRACT | Status: AC
Start: 2023-08-03 — End: 2023-08-03
  Administered 2023-08-03: 3 mL via RESPIRATORY_TRACT
  Filled 2023-08-03: qty 3

## 2023-08-03 MED ORDER — IOHEXOL 350 MG/ML SOLN
75.0000 mL | Freq: Once | INTRAVENOUS | Status: AC | PRN
  Administered 2023-08-03: 75 mL via INTRAVENOUS

## 2023-08-03 MED ORDER — IPRATROPIUM-ALBUTEROL 0.5-2.5 (3) MG/3ML IN SOLN
3.0000 mL | RESPIRATORY_TRACT | Status: DC | PRN
  Administered 2023-08-06: 3 mL via RESPIRATORY_TRACT
  Filled 2023-08-03: qty 3

## 2023-08-03 MED ORDER — BENZONATATE 100 MG PO CAPS
100.0000 mg | ORAL_CAPSULE | Freq: Three times a day (TID) | ORAL | Status: DC
  Administered 2023-08-03 – 2023-08-09 (×17): 100 mg via ORAL
  Filled 2023-08-03 (×17): qty 1

## 2023-08-03 MED ORDER — MORPHINE SULFATE (PF) 4 MG/ML IV SOLN
4.0000 mg | Freq: Once | INTRAVENOUS | Status: AC
Start: 2023-08-03 — End: 2023-08-03
  Administered 2023-08-03: 4 mg via INTRAVENOUS
  Filled 2023-08-03: qty 1

## 2023-08-03 MED ORDER — ROSUVASTATIN CALCIUM 20 MG PO TABS
20.0000 mg | ORAL_TABLET | Freq: Every day | ORAL | Status: DC
  Administered 2023-08-04 – 2023-08-09 (×6): 20 mg via ORAL
  Filled 2023-08-03 (×6): qty 1

## 2023-08-03 MED ORDER — LEVOTHYROXINE SODIUM 125 MCG PO TABS
125.0000 ug | ORAL_TABLET | Freq: Every day | ORAL | Status: DC
  Administered 2023-08-04 – 2023-08-09 (×6): 125 ug via ORAL
  Filled 2023-08-03 (×6): qty 1

## 2023-08-03 MED ORDER — SODIUM CHLORIDE 0.9 % IV SOLN: 250.0000 mL | INTRAVENOUS | Status: AC | PRN

## 2023-08-03 MED ORDER — APIXABAN 5 MG PO TABS
5.0000 mg | ORAL_TABLET | Freq: Two times a day (BID) | ORAL | Status: DC
  Administered 2023-08-03 – 2023-08-09 (×12): 5 mg via ORAL
  Filled 2023-08-03 (×12): qty 1

## 2023-08-03 MED ORDER — METOPROLOL SUCCINATE ER 25 MG PO TB24
12.5000 mg | ORAL_TABLET | Freq: Every day | ORAL | Status: DC
  Administered 2023-08-04 – 2023-08-09 (×6): 12.5 mg via ORAL
  Filled 2023-08-03 (×6): qty 1

## 2023-08-03 NOTE — ED Provider Notes (Signed)
[place] [facility] AT [facility] Provider Note   CSN: [id number] Arrival date & time: 08/03/23  1620     History  Chief Complaint  Patient presents with   Chest Pain    Tracy Park is a 67 y.o. female.  HPI   67 year old female with past medical history of lung cancer, COPD, CHF presents emergency department with concern for left-sided chest pain and shortness of breath.  Patient is on chronic oxygen.  States that she has been having intermittently productive cough and worsening left-sided chest pain with inhalation and exhalation.  She is anticoagulated on Eliquis and compliant.  Admits to increased wheezing not improved with home nebulizer.  Denies any acute fever/chills.  Denies any swelling of her lower extremities.  Home Medications Prior to Admission medications   Medication Sig Start Date End Date Taking? Authorizing Provider  albuterol (PROVENTIL) (2.5 MG/3ML) 0.083% nebulizer solution Take 3 mLs (2.5 mg total) by nebulization every 4 (four) hours while awake for 3 days, THEN 3 mLs (2.5 mg total) every 4 (four) hours as needed for wheezing or shortness of breath. 06/26/23 07/29/23  Danford, Earl Lites, MD  albuterol (VENTOLIN HFA) 108 (90 Base) MCG/ACT inhaler Inhale 2 puffs into the lungs every 6 (six) hours as needed for wheezing or shortness of breath. Patient taking differently: Inhale 2 puffs into the lungs every 4 (four) hours as needed for wheezing or shortness of breath. 01/13/23   Burnadette Pop, MD  ALPRAZolam Prudy Feeler) 0.5 MG tablet Take 0.5 mg by mouth in the morning and at bedtime. 03/30/23   [provider]  apixaban (ELIQUIS) 5 MG TABS tablet Take 1 tablet (5 mg total) by mouth 2 (two) times daily. 06/06/23   Jonah Blue, MD  benzonatate (TESSALON PERLES) 100 MG capsule Take 1 capsule (100 mg total) by mouth every 6 (six) hours as needed for cough. 06/26/23 06/25/24  Danford, Earl Lites, MD   Budeson-Glycopyrrol-Formoterol (BREZTRI AEROSPHERE) 160-9-4.8 MCG/ACT AERO Inhale 1 puff into the lungs in the morning and at bedtime.    [provider]  levothyroxine (SYNTHROID) 125 MCG tablet Take 125 mcg by mouth daily before breakfast.    [provider]  magnesium oxide (MAG-OX) 400 (240 Mg) MG tablet Take 400 mg by mouth in the morning.    [provider]  metoprolol succinate (TOPROL-XL) 25 MG 24 hr tablet Take 0.5 tablets (12.5 mg total) by mouth daily. 06/22/23 07/24/23  Narda Bonds, MD  nitroGLYCERIN (NITROSTAT) 0.4 MG SL tablet Place 0.4 mg under the tongue every 5 (five) minutes as needed for chest pain.    [provider]  oxyCODONE (ROXICODONE) 5 MG immediate release tablet Take 1 tablet (5 mg total) by mouth every 8 (eight) hours as needed for severe pain (pain score 7-10). 06/26/23 06/25/24  DanfordEarl Lites, MD  OXYGEN Inhale 2 L/min into the lungs continuous.    [provider]  predniSONE (DELTASONE) 50 MG tablet Take 1 tablet (50 mg total) by mouth daily with breakfast. 07/28/23   Almon Hercules, MD  rosuvastatin (CRESTOR) 20 MG tablet Take 20 mg by mouth daily.    [provider]  sertraline (ZOLOFT) 25 MG tablet Take 25 mg by mouth in the morning.    [provider]  SYSTANE COMPLETE PF 0.6 % SOLN Place 1 drop into both eyes 3 (three) times daily as needed (for dryness).    [provider]  TYLENOL 8 HOUR 650 MG CR  tablet Take 650-1,300 mg by mouth 2 (two) times daily as needed for pain.    [provider]      Allergies    Red dye #40 (allura red), Strawberry extract, Tomato, Aspirin, Tape, and Wound dressing adhesive    Review of Systems   Review of Systems  Constitutional:  Positive for fatigue. Negative for fever.  Respiratory:  Positive for cough, chest tightness and shortness of breath.   Cardiovascular:  Positive for chest pain. Negative for palpitations and leg swelling.   Gastrointestinal:  Negative for abdominal pain, diarrhea and vomiting.  Genitourinary:  Negative for flank pain.  Musculoskeletal:  Negative for back pain.  Skin:  Negative for rash.  Neurological:  Negative for headaches.    Physical Exam Updated Vital Signs BP 125/77   Pulse 73   Temp 98.1 F (36.7 C) (Oral)   Resp 19   Ht 5\' 3"  (1.6 m)   Wt 44.5 kg   SpO2 99%   BMI 17.38 kg/m  Physical Exam Vitals and nursing note reviewed.  Constitutional:      Appearance: Normal appearance. She is ill-appearing.  HENT:     Head: Normocephalic.     Mouth/Throat:     Mouth: Mucous membranes are moist.  Cardiovascular:     Rate and Rhythm: Normal rate.  Pulmonary:     Effort: Pulmonary effort is normal. No respiratory distress.     Breath sounds: Examination of the right-lower field reveals decreased breath sounds. Examination of the left-lower field reveals decreased breath sounds. Decreased breath sounds and wheezing present.  Abdominal:     Palpations: Abdomen is soft.     Tenderness: There is no abdominal tenderness.  Musculoskeletal:     Right lower leg: No edema.     Left lower leg: No edema.  Skin:    General: Skin is warm.  Neurological:     Mental Status: She is alert and oriented to person, place, and time. Mental status is at baseline.  Psychiatric:        Mood and Affect: Mood normal.     ED Results / Procedures / Treatments   Labs (all labs ordered are listed, but only abnormal results are displayed) Labs Reviewed  CBC WITH DIFFERENTIAL/PLATELET - Abnormal; Notable for the following components:      Result Value   WBC 11.8 (*)    Monocytes Absolute 1.1 (*)    All other components within normal limits  COMPREHENSIVE METABOLIC PANEL - Abnormal; Notable for the following components:   CO2 33 (*)    AST 14 (*)    All other components within normal limits  BRAIN NATRIURETIC PEPTIDE  TROPONIN I (HIGH SENSITIVITY)  TROPONIN I (HIGH SENSITIVITY)    EKG EKG  Interpretation Date/Time:  Sunday August 03 2023 16:31:19 EST Ventricular Rate:  94 PR Interval:  120 QRS Duration:  104 QT Interval:  344 QTC Calculation: 431 R Axis:   35  Text Interpretation: Sinus rhythm Right atrial enlargement Consider right ventricular hypertrophy Probable left ventricular hypertrophy Artifact in lead(s) V4 Similar to previous Confirmed by Coralee Pesa [phone]) on 08/03/2023 4:52:59 PM  Radiology DG Chest Port 1 View  Result Date: 08/03/2023 CLINICAL DATA:  Left-sided chest pain.  History of lung cancer. EXAM: PORTABLE CHEST 1 VIEW COMPARISON:  Chest x-ray dated July 24, 2023. FINDINGS: Normal heart size. Similar postsurgical changes related to prior left upper lobectomy with left apical volume loss, scarring, and fibrosis. Associated leftward tracheal deviation. Chronic blunting  of the left costophrenic angle. No focal consolidation, pleural effusion, or pneumothorax. No acute osseous abnormality. IMPRESSION: 1. No acute cardiopulmonary disease. 2. Stable postsurgical and post-treatment changes in the left lung. Electronically Signed   By: Obie Dredge M.D.   On: 08/03/2023 18:59    Procedures Procedures    Medications Ordered in ED Medications  methylPREDNISolone sodium succinate (SOLU-MEDROL) 125 mg/2 mL injection 125 mg (125 mg Intravenous Given 08/03/23 1740)  morphine (PF) 4 MG/ML injection 4 mg (4 mg Intravenous Given 08/03/23 1740)  ipratropium-albuterol (DUONEB) 0.5-2.5 (3) MG/3ML nebulizer solution 3 mL (3 mLs Nebulization Given 08/03/23 1927)    ED Course/ Medical Decision Making/ A&P                                 Medical Decision Making Amount and/or Complexity of Data Reviewed Labs: ordered. Radiology: ordered.  Risk Prescription drug management. Decision regarding hospitalization.   67 year old female presents emergency department left-sided chest pain, acute on chronic cough, shortness of breath.  She has history of COPD,  lung cancer s/p resection in remission, CHF.  She is on chronic oxygen.  On arrival she is tachypneic at times, has diffuse wheezing, on her home oxygen requirements.  With coughing she has severe left-sided chest pain.  Cough is intermittently productive of clear phlegm, no hemoptysis.  No swelling of her lower extremities.  EKG does not show any ischemic changes.  Blood work is reassuring, negative troponin, negative BNP.  Chest x-ray does not show any findings of CHF or pneumonia.  After breathing treatment and dose of steroids patient feels slightly improved.  Wheezing is improved but ongoing.  Patient is on Eliquis and compliance with low suspicion for PE.  However with ongoing pain, cough and shortness of breath we will pursue a CT PE study for further evaluation.  Patient will require admission for further breathing treatments and evaluation.  Patients evaluation and results requires admission for further treatment and care.  Spoke with hospitalist, reviewed patient's ED course and they accept admission.  Patient agrees with admission plan, offers no new complaints and is stable/unchanged at time of admit.        Final Clinical Impression(s) / ED Diagnoses Final diagnoses:  None    Rx / DC Orders ED Discharge Orders     None         Rozelle Logan, DO 08/03/23 2340

## 2023-08-03 NOTE — ED Notes (Signed)
Patient headed to CT at this time

## 2023-08-03 NOTE — ED Notes (Signed)
Patient Troponin collected at this time

## 2023-08-03 NOTE — ED Notes (Signed)
Patient Troponin sent to lab via tube station

## 2023-08-03 NOTE — ED Triage Notes (Signed)
Patient is here for evaluation of left sided chest and lung pain. Patient wears oxygen all the time at 2L. History of lung cancer. Pt reports heavier breathing and pain with inhaling and exhaling. Pt feels dehydrated.

## 2023-08-03 NOTE — H&P (Addendum)
History and Physical    Tracy Park ZOX:[id number] DOB: October 20, 1955 DOA: 08/03/2023  PCP: Jacquelin Hawking, PA-C   Patient coming from: Home   Chief Complaint:  Chief Complaint  Patient presents with   Chest Pain   ED TRIAGE note: Patient is here for evaluation of left sided chest and lung pain. Patient wears oxygen all the time at 2L. History of lung cancer. Pt reports heavier breathing and pain with inhaling and exhaling. Pt feels dehydrated.            HPI:  Tracy Park is a 67 y.o. female with medical history significant of f remote lung cancer s/p LUL resection, COPD, chronic hypoxic RF on 2 L, PAF on Eliquis, HTN, anxiety, depression, bipolar disorder, and tobacco use who presented emergency department for left-sided chest pain, lung pain and shortness of breath. Patient reported that she has chronic intermittent left-sided chest pain which it usually got worse every time she has a COPD exacerbation.  Reported chest pain get worse with deep breathing and coughing.  Denies any radiation, associated nausea and diaphoresis.  Patient denies any fever and chill. No other complaint at this time.  Currently smoking 2 to 3 cigarettes in a day.  Patient recently hospitalized 11/14 to 11/17 for COPD exacerbation.  Per chart review when patient was admitted 1114 she had atypical left-sided chest pain likely musculoskeletal and pleuritic origin.   ED Course:  At presentation to ED patient is hemodynamically stable. Pending respiratory panel. Troponin x 2 unremarkable. Normal BNP 68. CBC unremarkable except metabolic alkalosis 33 is chronic in nature. CBC unremarkable except mild leukocytosis 11.8.  EKG shows sinus rhythm heart rate 98.  Right atrial enlargement, right ventricular hypertrophy and left ventricular hypertrophy pattern.  Chest x-ray no acute cardiopulmonary process.  Stable postsurgical and posttreatment change of the left lung field.  Pending CTA. ED  physician Dr. Kevan Ny reported that physical exam showed wheezing and patient has chronic cough at baseline.  Given patient coming with significant left-sided pleuritic chest pain CTA has been obtained to rule out any development of early pneumonia or PE.  In the ED patient has been treated with DuoNeb nebulizer and Solu-Medrol 125 mg.  [facility] has been contacted for further evaluation management of COPD exacerbation and left-sided pleuritic chest pain.  Review of Systems:  Review of Systems  Constitutional:  Negative for chills, fever, malaise/fatigue and weight loss.  Respiratory:  Positive for cough, sputum production, shortness of breath and wheezing. Negative for hemoptysis.   Cardiovascular:  Positive for chest pain. Negative for palpitations, orthopnea, claudication, leg swelling and PND.  Gastrointestinal:  Negative for abdominal pain, heartburn, nausea and vomiting.  Musculoskeletal:  Negative for back pain, joint pain, myalgias and neck pain.  Neurological:  Negative for dizziness and headaches.  Endo/Heme/Allergies:  Does not bruise/bleed easily.  Psychiatric/Behavioral:  The patient is not nervous/anxious.     Past Medical History:  Diagnosis Date   Anginal pain (HCC)    Anxiety    Bipolar disorder (HCC)    Cancer (HCC)    COPD (chronic obstructive pulmonary disease) (HCC)    Dyspnea    Family history of adverse reaction to anesthesia    History of kidney stones    Hydroureteronephrosis 08/16/2021   Hypothyroidism    Lung cancer (HCC)    Myocardial infarction (HCC)    Paroxysmal atrial fibrillation (HCC)    PTSD (post-traumatic stress disorder)    Sleep apnea    Thyroid disease  Past Surgical History:  Procedure Laterality Date   ABDOMINAL HYSTERECTOMY     BACK SURGERY     CYSTOSCOPY W/ URETERAL STENT PLACEMENT Right 05/03/2021   Procedure: CYSTOSCOPY WITH RETROGRADE PYELOGRAM/URETERAL STENT PLACEMENT;  Surgeon: Crist Fat, MD;  Location: [facility];   Service: Urology;  Laterality: Right;   EYE SURGERY     kidney stent     thyroidectomy       reports that she has been smoking cigarettes. She has never used smokeless tobacco. She reports that she does not currently use alcohol. She reports that she does not currently use drugs.  Allergies  Allergen Reactions   Red Dye #40 (Allura Red) Hives, Itching and Other (See Comments)    Red food dye   Strawberry Extract Hives and Itching   Tomato Hives and Itching   Aspirin Hives   Tape Rash and Other (See Comments)    Prefers paper tape   Wound Dressing Adhesive Rash    Family History  Problem Relation Age of Onset   Hypertension Mother    Heart failure Mother    Hypertension Father    Diabetes Father    Heart failure Father     Prior to Admission medications   Medication Sig Start Date End Date Taking? Authorizing Provider  albuterol (PROVENTIL) (2.5 MG/3ML) 0.083% nebulizer solution Take 3 mLs (2.5 mg total) by nebulization every 4 (four) hours while awake for 3 days, THEN 3 mLs (2.5 mg total) every 4 (four) hours as needed for wheezing or shortness of breath. 06/26/23 07/29/23  Danford, Earl Lites, MD  albuterol (VENTOLIN HFA) 108 (90 Base) MCG/ACT inhaler Inhale 2 puffs into the lungs every 6 (six) hours as needed for wheezing or shortness of breath. Patient taking differently: Inhale 2 puffs into the lungs every 4 (four) hours as needed for wheezing or shortness of breath. 01/13/23   Burnadette Pop, MD  ALPRAZolam Prudy Feeler) 0.5 MG tablet Take 0.5 mg by mouth in the morning and at bedtime. 03/30/23   [provider]  apixaban (ELIQUIS) 5 MG TABS tablet Take 1 tablet (5 mg total) by mouth 2 (two) times daily. 06/06/23   Jonah Blue, MD  benzonatate (TESSALON PERLES) 100 MG capsule Take 1 capsule (100 mg total) by mouth every 6 (six) hours as needed for cough. 06/26/23 06/25/24  Danford, Earl Lites, MD  Budeson-Glycopyrrol-Formoterol (BREZTRI AEROSPHERE) 160-9-4.8  MCG/ACT AERO Inhale 1 puff into the lungs in the morning and at bedtime.    [provider]  levothyroxine (SYNTHROID) 125 MCG tablet Take 125 mcg by mouth daily before breakfast.    [provider]  magnesium oxide (MAG-OX) 400 (240 Mg) MG tablet Take 400 mg by mouth in the morning.    [provider]  metoprolol succinate (TOPROL-XL) 25 MG 24 hr tablet Take 0.5 tablets (12.5 mg total) by mouth daily. 06/22/23 07/24/23  Narda Bonds, MD  nitroGLYCERIN (NITROSTAT) 0.4 MG SL tablet Place 0.4 mg under the tongue every 5 (five) minutes as needed for chest pain.    [provider]  oxyCODONE (ROXICODONE) 5 MG immediate release tablet Take 1 tablet (5 mg total) by mouth every 8 (eight) hours as needed for severe pain (pain score 7-10). 06/26/23 06/25/24  DanfordEarl Lites, MD  OXYGEN Inhale 2 L/min into the lungs continuous.    [provider]  predniSONE (DELTASONE) 50 MG tablet Take 1 tablet (50 mg total) by mouth daily with breakfast. 07/28/23   Almon Hercules,  MD  rosuvastatin (CRESTOR) 20 MG tablet Take 20 mg by mouth daily.    [provider]  sertraline (ZOLOFT) 25 MG tablet Take 25 mg by mouth in the morning.    [provider]  SYSTANE COMPLETE PF 0.6 % SOLN Place 1 drop into both eyes 3 (three) times daily as needed (for dryness).    [provider]  TYLENOL 8 HOUR 650 MG CR tablet Take 650-1,300 mg by mouth 2 (two) times daily as needed for pain.    [provider]     Physical Exam: Vitals:   08/03/23 2231 08/03/23 2317 08/03/23 2355 08/04/23 0116  BP: 102/62 109/73    Pulse: 69 73 (!) 50   Resp: 16 20    Temp: 97.7 F (36.5 C) 97.8 F (36.6 C)    TempSrc: Oral Oral    SpO2: 96% (!) 70% 92% 99%  Weight:  44.3 kg    Height:  5\' 3"  (1.6 m)      Physical Exam Vitals and nursing note reviewed.  Constitutional:      General: She is not in acute distress.    Appearance: She is not ill-appearing.      Comments: Cachectic appearance.  Cardiovascular:     Rate and Rhythm: Normal rate and regular rhythm.     Heart sounds: Normal heart sounds.  Pulmonary:     Effort: No tachypnea or respiratory distress.     Breath sounds: No stridor. Examination of the right-upper field reveals wheezing. Examination of the left-upper field reveals wheezing. Examination of the right-middle field reveals wheezing. Examination of the left-middle field reveals wheezing. Examination of the right-lower field reveals wheezing. Examination of the left-lower field reveals wheezing. Wheezing present. No rhonchi or rales.  Musculoskeletal:     Right lower leg: No edema.     Left lower leg: No edema.  Skin:    General: Skin is dry.     Capillary Refill: Capillary refill takes less than 2 seconds.  Neurological:     Mental Status: She is oriented to person, place, and time.  Psychiatric:        Mood and Affect: Mood normal.        Behavior: Behavior normal.      Labs on Admission: I have personally reviewed following labs and imaging studies  CBC: Recent Labs  Lab 08/03/23 1723  WBC 11.8*  NEUTROABS 7.4  HGB 13.0  HCT 40.7  MCV 96.7  PLT 263   Basic Metabolic Panel: Recent Labs  Lab 08/03/23 1723  NA 140  K 3.9  CL 99  CO2 33*  GLUCOSE 99  BUN 20  CREATININE 0.57  CALCIUM 9.0   GFR: Estimated Creatinine Clearance: 49 mL/min (by C-G formula based on SCr of 0.57 mg/dL). Liver Function Tests: Recent Labs  Lab 08/03/23 1723  AST 14*  ALT 15  ALKPHOS 70  BILITOT 0.3  PROT 6.7  ALBUMIN 3.6   No results for input(s): "LIPASE", "AMYLASE" in the last 168 hours. No results for input(s): "AMMONIA" in the last 168 hours. Coagulation Profile: No results for input(s): "INR", "PROTIME" in the last 168 hours. Cardiac Enzymes: Recent Labs  Lab 08/03/23 1723 08/03/23 1925  TROPONINIHS 8 8   BNP (last 3 results) Recent Labs    03/06/23 1938 04/16/23 2048 08/03/23 1723  BNP 54.6  114.4* 68.2   HbA1C: No results for input(s): "HGBA1C" in the last 72 hours. CBG: No results for input(s): "GLUCAP" in the last  168 hours. Lipid Profile: No results for input(s): "CHOL", "HDL", "LDLCALC", "TRIG", "CHOLHDL", "LDLDIRECT" in the last 72 hours. Thyroid Function Tests: No results for input(s): "TSH", "T4TOTAL", "FREET4", "T3FREE", "THYROIDAB" in the last 72 hours. Anemia Panel: No results for input(s): "VITAMINB12", "FOLATE", "FERRITIN", "TIBC", "IRON", "RETICCTPCT" in the last 72 hours. Urine analysis:    Component Value Date/Time   COLORURINE YELLOW 06/25/2023 0536   APPEARANCEUR CLEAR 06/25/2023 0536   LABSPEC 1.023 06/25/2023 0536   PHURINE 6.0 06/25/2023 0536   GLUCOSEU 50 (A) 06/25/2023 0536   HGBUR SMALL (A) 06/25/2023 0536   BILIRUBINUR NEGATIVE 06/25/2023 0536   KETONESUR NEGATIVE 06/25/2023 0536   PROTEINUR NEGATIVE 06/25/2023 0536   UROBILINOGEN 0.2 11/06/2009 1559   NITRITE NEGATIVE 06/25/2023 0536   LEUKOCYTESUR NEGATIVE 06/25/2023 0536    Radiological Exams on Admission: I have personally reviewed images CT Angio Chest PE W/Cm &/Or Wo Cm  Result Date: 08/03/2023 CLINICAL DATA:  Low to intermediate probability pulmonary embolism, positive D-dimer. Left chest pain, hypoxia, history of lung cancer. EXAM: CT ANGIOGRAPHY CHEST WITH CONTRAST TECHNIQUE: Multidetector CT imaging of the chest was performed using the standard protocol during bolus administration of intravenous contrast. Multiplanar CT image reconstructions and MIPs were obtained to evaluate the vascular anatomy. RADIATION DOSE REDUCTION: This exam was performed according to the departmental dose-optimization program which includes automated exposure control, adjustment of the mA and/or kV according to patient size and/or use of iterative reconstruction technique. CONTRAST:  75mL OMNIPAQUE IOHEXOL 350 MG/ML SOLN COMPARISON:  None Available. FINDINGS: Cardiovascular: Adequate opacification of the  pulmonary arterial tree. No intraluminal filling defect identified to suggest acute pulmonary embolism. Central pulmonary arteries are of normal caliber. Mild coronary artery calcification. Cardiac size within normal limits. No pericardial effusion. Mild atherosclerotic calcification within the thoracic aorta. No aortic aneurysm. Mediastinum/Nodes: No enlarged mediastinal, hilar, or axillary lymph nodes. Thyroid gland, trachea, and esophagus demonstrate no significant findings. Lungs/Pleura: Surgical changes of left upper lobectomy are identified with associated left-sided volume loss. Cylindrical and cystic bronchiectasis within the left apex appears stable since prior examination, possibly reflecting the sequela of remote or chronic infection. Advanced emphysema. Stable subpleural mom scarring within the posterior right upper lobe. No superimposed confluent pulmonary infiltrate. There is diffuse bronchial wall thickening present with scattered areas of airway impaction in keeping with airway inflammation, similar to prior examination. No pneumothorax or pleural effusion Upper Abdomen: No acute abnormality. Musculoskeletal: No acute bone abnormality. No lytic or blastic bone lesion. Review of the MIP images confirms the above findings. IMPRESSION: 1. No pulmonary embolism. 2. Mild coronary artery calcification. 3. Stable surgical changes of left upper lobectomy. 4. Advanced emphysema 5. Stable diffuse bronchial wall thickening with scattered areas of airway impaction in keeping with airway inflammation. Aortic Atherosclerosis (ICD10-I70.0) and Emphysema (ICD10-J43.9). Electronically Signed   By: Helyn Numbers M.D.   On: 08/03/2023 22:37   DG Chest Port 1 View  Result Date: 08/03/2023 CLINICAL DATA:  Left-sided chest pain.  History of lung cancer. EXAM: PORTABLE CHEST 1 VIEW COMPARISON:  Chest x-ray dated July 24, 2023. FINDINGS: Normal heart size. Similar postsurgical changes related to prior left upper  lobectomy with left apical volume loss, scarring, and fibrosis. Associated leftward tracheal deviation. Chronic blunting of the left costophrenic angle. No focal consolidation, pleural effusion, or pneumothorax. No acute osseous abnormality. IMPRESSION: 1. No acute cardiopulmonary disease. 2. Stable postsurgical and post-treatment changes in the left lung. Electronically Signed   By: Obie Dredge M.D.   On: 08/03/2023  18:59    EKG: My personal interpretation of EKG shows:  EKG shows sinus rhythm heart rate 94.  Right atrial enlargement, right ventricular hypertrophy elevated hypertrophy pattern.    Assessment/Plan: Principal Problem:   COPD exacerbation (HCC) Active Problems:   Chronic hypoxic respiratory failure (HCC)   Paroxysmal atrial fibrillation (HCC)   Hypothyroidism   History of lung cancer   Bipolar disorder (HCC)   Hyperlipidemia   Generalized anxiety disorder   History of depression   Left-sided chest pain    Assessment and Plan: COPD exacerbation Chronic hypoxic respiratory failure 2 L oxygen at baseline > Patient presenting with complaining of left-sided chest pain and shortness of breath with associated cough for last 1 to 2 days.  At presentation to ED patient is hemodynamically stable.  Physical exam showed bilateral wheezing and patient has productive cough. -CBC leukocytosis 11.4.  Chest x-ray unremarkable.  Stable postsurgical and posttreatment change of the left lung. - CTA chest has been ordered in the ED to rule out early development of pneumonia or pulmonary embolism. - Pending respiratory panel and nasal MRSA PCR. - Concern for SOB exacerbation.  In the ED patient has been treated with Solu-Medrol 125 mg and DuoNeb nebulizer treatment. - Plan to continue Xopenex and ipratropium every 6 hours scheduled and DuoNeb as needed. -Continue DuoNeb 60 mg daily for 4 more days.  Unable to treat with azithromycin as history of allergy and rash with azithromycin. -  Continue Tessalon 3 times daily. - Continue pulmonary toiletry, flutter valve and aspiration precaution. - Continue oxygen 2 to 3 L to keep O2 sat above 92%. - Need to follow-up with CTA chest. Addendum: -CTA chest ruled out pulmonary embolism and no pneumonia.  It showed mild coronary artery calcification.  Stable surgical change of left upper lobe.  Advanced emphysema.  Stable diffuse bronchial wall thickening with scattered area of ER impaction in keeping with airway inflammation. -Plan to continue above management.  Chronic left-sided medial and upper chest pain Left-sided pleuritic chest pain/musculoskeletal pain -Left-sided chest pain which is chronic in nature and getting worse with cough, deep breathing and whenever patient is COPD exacerbation. - Normal troponin x 2 and EKG unremarkable for  in ischemic change - No concern for acute coronary syndrome - Left-sided chest pain likely chronic musculoskeletal versus inflammatory origin. -In the ED patient received morphine 4 mg which improved the chest pain.  Continue Tylenol as needed.   Paroxysmal atrial fibrillation -Continue Toprol-XL 25 mg daily and Eliquis 5 mg twice daily.  Generalized anxiety disorder History of depression History of bipolar disorder -Continue Zoloft 20 mg daily  Hypothyroidism - Continue levothyroxine 125 mg daily  Hyperlipidemia -Continue Crestor  History of lung cancer -Stage IIIA LUL lung adenocarcinoma , s/p left upper lobectomy 2018 and adjunct chemo complete in 2019. No recent follow up seen with Dr. Neil Crouch - she was lost to follow up  Enlarged nodule RUL seen 10/26/21 measuring 8 mm - could be secondary to infection, inflammation, scarring or malignancy. -Patient follows pulmonology with [facility] healthcare system.  Addendum: Discussed CODE STATUS with the patient at the bedside.  Patient wished to does not proceed with any chest compression, mechanical ventilation and intubation if situation  arises except continue all aggressive medical management include IV fluid and IV antibiotic and if needed transfer to ICU for higher level of care. -CODE STATUS has been changed to FULL code DNR-limited.  -Bedside patient nurse reporting that patient having left-sided musculoskeletal chest pain.  Tylenol  is not helping much. Patient is allergic to ibuprofen. I am concerned that given patient has COPD exacerbation there is risk to treat with IV narcotics-morphine, Dilaudid or fentanyl which can cause respiratory depression.  In the ED patient received morphine 4 mg IV one-time dose - Plan to continue Tylenol as needed.  Continue morphine 1 mg every 4 hour as needed for severe 7-10.  Please try to avoid morphine as much as can unless pain is scale is above 7.     DVT prophylaxis:  Eliquis Code Status:  DNR/DNI(Do NOT Intubate) Diet: Heart healthy diet Family Communication: No family member at bedside now Disposition Plan: Need to follow-up with CTA chest finding.  Tentative discharge to home next 2 to 3 days. Consults: None at this time Admission status:   Inpatient, Telemetry bed  Severity of Illness: The appropriate patient status for this patient is INPATIENT. Inpatient status is judged to be reasonable and necessary in order to provide the required intensity of service to ensure the patient's safety. The patient's presenting symptoms, physical exam findings, and initial radiographic and laboratory data in the context of their chronic comorbidities is felt to place them at high risk for further clinical deterioration. Furthermore, it is not anticipated that the patient will be medically stable for discharge from the hospital within 2 midnights of admission.   * I certify that at the point of admission it is my clinical judgment that the patient will require inpatient hospital care spanning beyond 2 midnights from the point of admission due to high intensity of service, high risk for further  deterioration and high frequency of surveillance required.[name]    Tereasa Coop, MD [facility]  How to contact the [facility] Attending or Consulting provider 7A - 7P or covering provider during after hours 7P -7A, for this patient.  Check the care team in [facility] and look for a) attending/consulting [facility] provider listed and b) the [facility] team listed Log into [email or web address] and use [facility] universal password to access. If you do not have the password, please contact the hospital operator. Locate the [facility] provider you are looking for under [facility] and page to a number that you can be directly reached. If you still have difficulty reaching the provider, please page the [facility] (Director on Call) for the Hospitalists listed on amion for assistance.  08/04/2023, 2:55 AM

## 2023-08-03 NOTE — ED Notes (Signed)
Patient ok to go upstairs at this time per Cheri Rous, RN

## 2023-08-03 NOTE — ED Notes (Signed)
ED TO INPATIENT HANDOFF REPORT  Name/Age/Gender Tracy Park 67 y.o. female  Code Status    Code Status Orders  (From admission, onward)           Start     Ordered   08/03/23 2201  Full code  Continuous       Question:  By:  Answer:  Consent: discussion documented in EHR   08/03/23 2203           Code Status History     Date Active Date Inactive Code Status Order ID Comments User Context   07/24/2023 2244 07/27/2023 2024 Limited: Do not attempt resuscitation (DNR) -DNR-LIMITED -Do Not Intubate/DNI  [id number]  Nolberto Hanlon, MD ED   06/25/2023 0003 06/27/2023 2009 Limited: Do not attempt resuscitation (DNR) -DNR-LIMITED -Do Not Intubate/DNI  [id number]  Charlsie Quest, MD ED   06/19/2023 1739 06/22/2023 2128 Do not attempt resuscitation (DNR) PRE-ARREST INTERVENTIONS DESIRED [id number]  Mansy, Vernetta Honey, MD ED   06/04/2023 1509 06/06/2023 1814 Do not attempt resuscitation (DNR) PRE-ARREST INTERVENTIONS DESIRED [id number]  Maryln Gottron, MD ED   04/28/2023 0741 05/01/2023 1830 DNR [id number]  Maryln Gottron, MD ED   04/17/2023 0151 04/18/2023 1944 DNR [id number]  Anselm Jungling, DO Inpatient   04/11/2023 2132 04/14/2023 0035 Full Code [id number]  Hugelmeyer, Alexis, DO ED   03/26/2023 0938 03/27/2023 1838 DNR [id number]  Bobette Mo, MD Inpatient   03/06/2023 2249 03/10/2023 2107 DNR [id number]  Angie Fava, DO ED   02/23/2023 2046 02/26/2023 2245 DNR [id number]  Briscoe Deutscher, MD ED   01/25/2023 2350 01/28/2023 1928 DNR [id number]  Anselm Jungling, DO Inpatient   01/08/2023 0235 01/13/2023 1928 DNR [id number]  Hillary Bow, DO Inpatient   01/08/2023 0117 01/08/2023 0235 Full Code [id number]  Hillary Bow, DO ED   03/24/2022 1159 03/28/2022 1546 Full Code [id number]  Bobette Mo, MD ED   03/14/2022 2132 03/16/2022 1602 Full Code [id number]  Karl Ito, MD Inpatient   03/14/2022 0331 03/14/2022 2132 DNR [id number]  Tomma Lightning, MD ED   03/13/2022 2149 03/14/2022 0331 DNR [id number]   Mansy, Vernetta Honey, MD ED   02/26/2022 0200 03/01/2022 1555 DNR [id number]  Anselm Jungling, DO ED   08/16/2021 2316 08/25/2021 2324 DNR [id number]  John Giovanni, MD ED   08/16/2021 2253 08/16/2021 2316 Full Code [id number]  John Giovanni, MD ED   05/03/2021 0052 05/05/2021 1756 Full Code [id number]  Eduard Clos, MD ED   04/10/2020 0342 04/12/2020 2137 Full Code [id number]  Eduard Clos, MD ED   04/28/2012 0926 04/28/2012 2047 Full Code [id number]  Kelby Aline, RN Inpatient       Home/SNF/Other Home  Chief Complaint COPD exacerbation [facility]) [J44.1]  Level of Care/Admitting Diagnosis ED Disposition     ED Disposition  Admit   Condition  --   Comment  Hospital Area: [facility] [[id number]]  Level of Care: Telemetry [5]  Admit to tele based on following criteria: Other see comments  Comments: Monitor for tachycardia, bradycardia arrhythmia.  History of paroxysmal atrial fibrillation  May admit patient to [facility] or [facility] if equivalent level of care is available:: No  Covid Evaluation: Asymptomatic - no recent exposure (last 10 days) testing not required  Diagnosis: COPD exacerbation [facility]) [[id number]]  Admitting Physician: Tereasa Coop [[id number]]  Attending Physician: Tereasa Coop [[id number]]  Certification:: I certify this patient will need inpatient services for at least  2 midnights  Expected Medical Readiness: 08/08/2023          Medical History Past Medical History:  Diagnosis Date   Anginal pain (HCC)    Anxiety    Bipolar disorder (HCC)    Cancer (HCC)    COPD (chronic obstructive pulmonary disease) (HCC)    Dyspnea    Family history of adverse reaction to anesthesia    History of kidney stones    Hydroureteronephrosis 08/16/2021   Hypothyroidism    Lung cancer (HCC)    Myocardial infarction (HCC)    Paroxysmal atrial fibrillation (HCC)    PTSD (post-traumatic stress disorder)    Sleep apnea    Thyroid disease      Allergies Allergies  Allergen Reactions   Red Dye #40 (Allura Red) Hives, Itching and Other (See Comments)    Red food dye   Strawberry Extract Hives and Itching   Tomato Hives and Itching   Aspirin Hives   Tape Rash and Other (See Comments)    Prefers paper tape   Wound Dressing Adhesive Rash    IV Location/Drains/Wounds Patient Lines/Drains/Airways Status     Active Line/Drains/Airways     Name Placement date Placement time Site Days   Peripheral IV 08/03/23 20 G 1" Left Antecubital 08/03/23  1739  Antecubital  less than 1            Labs/Imaging Results for orders placed or performed during the hospital encounter of 08/03/23 (from the past 48 hour(s))  CBC with Differential     Status: Abnormal   Collection Time: 08/03/23  5:23 PM  Result Value Ref Range   WBC 11.8 (H) 4.0 - 10.5 K/uL   RBC 4.21 3.87 - 5.11 MIL/uL   Hemoglobin 13.0 12.0 - 15.0 g/dL   HCT 62.1 30.8 - 65.7 %   MCV 96.7 80.0 - 100.0 fL   MCH 30.9 26.0 - 34.0 pg   MCHC 31.9 30.0 - 36.0 g/dL   RDW 84.6 96.2 - 95.2 %   Platelets 263 150 - 400 K/uL   nRBC 0.0 0.0 - 0.2 %   Neutrophils Relative % 63 %   Neutro Abs 7.4 1.7 - 7.7 K/uL   Lymphocytes Relative 27 %   Lymphs Abs 3.2 0.7 - 4.0 K/uL   Monocytes Relative 9 %   Monocytes Absolute 1.1 (H) 0.1 - 1.0 K/uL   Eosinophils Relative 1 %   Eosinophils Absolute 0.1 0.0 - 0.5 K/uL   Basophils Relative 0 %   Basophils Absolute 0.0 0.0 - 0.1 K/uL   Immature Granulocytes 0 %   Abs Immature Granulocytes 0.04 0.00 - 0.07 K/uL    Comment: Performed at [facility], [address], [place], [place] [address]  Comprehensive metabolic panel     Status: Abnormal   Collection Time: 08/03/23  5:23 PM  Result Value Ref Range   Sodium 140 135 - 145 mmol/L   Potassium 3.9 3.5 - 5.1 mmol/L   Chloride 99 98 - 111 mmol/L   CO2 33 (H) 22 - 32 mmol/L   Glucose, Bld 99 70 - 99 mg/dL    Comment: Glucose reference range applies only to samples  taken after fasting for at least 8 hours.   BUN 20 8 - 23 mg/dL   Creatinine, Ser 4.40 0.44 - 1.00 mg/dL   Calcium 9.0 8.9 - 10.2 mg/dL   Total Protein 6.7 6.5 - 8.1 g/dL   Albumin 3.6 3.5 - 5.0 g/dL  AST 14 (L) 15 - 41 U/L   ALT 15 0 - 44 U/L   Alkaline Phosphatase 70 38 - 126 U/L   Total Bilirubin 0.3 <1.2 mg/dL   GFR, Estimated >78 >29 mL/min    Comment: (NOTE) Calculated using the CKD-EPI Creatinine Equation (2021)    Anion gap 8 5 - 15    Comment: Performed at [facility], [address], [place], [place] [address]  Troponin I (High Sensitivity)     Status: None   Collection Time: 08/03/23  5:23 PM  Result Value Ref Range   Troponin I (High Sensitivity) 8 <18 ng/L    Comment: (NOTE) Elevated high sensitivity troponin I (hsTnI) values and significant  changes across serial measurements may suggest ACS but many other  chronic and acute conditions are known to elevate hsTnI results.  Refer to the "Links" section for chest pain algorithms and additional  guidance. Performed at [facility], [address], [place], [place] [address]   Brain natriuretic peptide     Status: None   Collection Time: 08/03/23  5:23 PM  Result Value Ref Range   B Natriuretic Peptide 68.2 0.0 - 100.0 pg/mL    Comment: Performed at [facility], [address], [place], [place] [address]  Troponin I (High Sensitivity)     Status: None   Collection Time: 08/03/23  7:25 PM  Result Value Ref Range   Troponin I (High Sensitivity) 8 <18 ng/L    Comment: (NOTE) Elevated high sensitivity troponin I (hsTnI) values and significant  changes across serial measurements may suggest ACS but many other  chronic and acute conditions are known to elevate hsTnI results.  Refer to the "Links" section for chest pain algorithms and additional  guidance. Performed at [facility], [address], [place], [place] [address]    DG Chest  Port 1 View  Result Date: 08/03/2023 CLINICAL DATA:  Left-sided chest pain.  History of lung cancer. EXAM: PORTABLE CHEST 1 VIEW COMPARISON:  Chest x-ray dated July 24, 2023. FINDINGS: Normal heart size. Similar postsurgical changes related to prior left upper lobectomy with left apical volume loss, scarring, and fibrosis. Associated leftward tracheal deviation. Chronic blunting of the left costophrenic angle. No focal consolidation, pleural effusion, or pneumothorax. No acute osseous abnormality. IMPRESSION: 1. No acute cardiopulmonary disease. 2. Stable postsurgical and post-treatment changes in the left lung. Electronically Signed   By: Obie Dredge M.D.   On: 08/03/2023 18:59    Pending Labs Unresulted Labs (From admission, onward)     Start     Ordered   08/04/23 0500  Comprehensive metabolic panel  Tomorrow morning,   R        08/03/23 2204   08/04/23 0500  CBC  Tomorrow morning,   R        08/03/23 2204   08/03/23 2202  MRSA Next Gen by PCR, Nasal  (COPD / Pneumonia / Cellulitis / Lower Extremity Wound)  Once,   R        08/03/23 2203   08/03/23 2144  Resp panel by RT-PCR (RSV, Flu A&B, Covid) Anterior Nasal Swab  Once,   URGENT        08/03/23 2143            Vitals/Pain Today's Vitals   08/03/23 2100 08/03/23 2130 08/03/23 2145 08/03/23 2215  BP: 114/69 118/69 115/75 110/73  Pulse: 69 64 65 63  Resp: 20 19 17  20  Temp:      TempSrc:      SpO2: 96% 96% 96% 97%  Weight:      Height:      PainSc:        Isolation Precautions No active isolations  Medications Medications  HYDROmorphone (DILAUDID) injection 1 mg (has no administration in time range)  metoprolol succinate (TOPROL-XL) 24 hr tablet 12.5 mg (has no administration in time range)  rosuvastatin (CRESTOR) tablet 20 mg (has no administration in time range)  sertraline (ZOLOFT) tablet 25 mg (has no administration in time range)  levothyroxine (SYNTHROID) tablet 125 mcg (has no administration in time  range)  apixaban (ELIQUIS) tablet 5 mg (has no administration in time range)  ipratropium-albuterol (DUONEB) 0.5-2.5 (3) MG/3ML nebulizer solution 3 mL (has no administration in time range)  levalbuterol (XOPENEX) nebulizer solution 0.63 mg (has no administration in time range)  ipratropium (ATROVENT) nebulizer solution 0.5 mg (has no administration in time range)  sodium chloride flush (NS) 0.9 % injection 3 mL (has no administration in time range)  sodium chloride flush (NS) 0.9 % injection 3 mL (has no administration in time range)  0.9 %  sodium chloride infusion (has no administration in time range)  bisacodyl (DULCOLAX) EC tablet 5 mg (has no administration in time range)  ondansetron (ZOFRAN) tablet 4 mg (has no administration in time range)    Or  ondansetron (ZOFRAN) injection 4 mg (has no administration in time range)  acetaminophen (TYLENOL) tablet 650 mg (has no administration in time range)    Or  acetaminophen (TYLENOL) suppository 650 mg (has no administration in time range)  methylPREDNISolone sodium succinate (SOLU-MEDROL) 125 mg/2 mL injection 60 mg (has no administration in time range)  pantoprazole (PROTONIX) EC tablet 40 mg (has no administration in time range)  benzonatate (TESSALON) capsule 100 mg (has no administration in time range)  methylPREDNISolone sodium succinate (SOLU-MEDROL) 125 mg/2 mL injection 125 mg (125 mg Intravenous Given 08/03/23 1740)  morphine (PF) 4 MG/ML injection 4 mg (4 mg Intravenous Given 08/03/23 1740)  ipratropium-albuterol (DUONEB) 0.5-2.5 (3) MG/3ML nebulizer solution 3 mL (3 mLs Nebulization Given 08/03/23 1927)  iohexol (OMNIPAQUE) 350 MG/ML injection 75 mL (75 mLs Intravenous Contrast Given 08/03/23 2219)    Mobility walks

## 2023-08-03 NOTE — ED Notes (Signed)
Patient provided with a cup of coffee with cream and sugar after clarifying with provider it was ok for her to drink at this time.

## 2023-08-04 ENCOUNTER — Other Ambulatory Visit: Payer: Self-pay

## 2023-08-04 ENCOUNTER — Encounter (HOSPITAL_COMMUNITY): Payer: Self-pay | Admitting: Internal Medicine

## 2023-08-04 DIAGNOSIS — J441 Chronic obstructive pulmonary disease with (acute) exacerbation: Secondary | ICD-10-CM | POA: Diagnosis not present

## 2023-08-04 LAB — CBC
HCT: 40.4 % (ref 36.0–46.0)
Hemoglobin: 12.3 g/dL (ref 12.0–15.0)
MCH: 29.7 pg (ref 26.0–34.0)
MCHC: 30.4 g/dL (ref 30.0–36.0)
MCV: 97.6 fL (ref 80.0–100.0)
Platelets: 283 10*3/uL (ref 150–400)
RBC: 4.14 MIL/uL (ref 3.87–5.11)
RDW: 12.4 % (ref 11.5–15.5)
WBC: 6.1 10*3/uL (ref 4.0–10.5)
nRBC: 0 % (ref 0.0–0.2)

## 2023-08-04 LAB — COMPREHENSIVE METABOLIC PANEL
ALT: 14 U/L (ref 0–44)
AST: 16 U/L (ref 15–41)
Albumin: 3.6 g/dL (ref 3.5–5.0)
Alkaline Phosphatase: 67 U/L (ref 38–126)
Anion gap: 6 (ref 5–15)
BUN: 21 mg/dL (ref 8–23)
CO2: 32 mmol/L (ref 22–32)
Calcium: 8.8 mg/dL — ABNORMAL LOW (ref 8.9–10.3)
Chloride: 98 mmol/L (ref 98–111)
Creatinine, Ser: 0.56 mg/dL (ref 0.44–1.00)
GFR, Estimated: >60 mL/min (ref >60)
Glucose, Bld: 222 mg/dL — ABNORMAL HIGH (ref 70–99)
Potassium: 4.1 mmol/L (ref 3.5–5.1)
Sodium: 136 mmol/L (ref 135–145)
Total Bilirubin: 0.2 mg/dL (ref <1.2)
Total Protein: 6.5 g/dL (ref 6.5–8.1)

## 2023-08-04 MED ORDER — ACETAMINOPHEN-CODEINE 300-30 MG PO TABS
1.0000 | ORAL_TABLET | ORAL | Status: DC | PRN
  Administered 2023-08-04 – 2023-08-08 (×11): 1 via ORAL
  Filled 2023-08-04 (×11): qty 1

## 2023-08-04 MED ORDER — MORPHINE SULFATE (PF) 2 MG/ML IV SOLN
1.0000 mg | INTRAVENOUS | Status: DC | PRN
  Administered 2023-08-04: 1 mg via INTRAVENOUS
  Filled 2023-08-04: qty 1

## 2023-08-04 MED ORDER — LEVALBUTEROL HCL 0.63 MG/3ML IN NEBU
0.6300 mg | INHALATION_SOLUTION | Freq: Three times a day (TID) | RESPIRATORY_TRACT | Status: DC
  Administered 2023-08-04 – 2023-08-09 (×16): 0.63 mg via RESPIRATORY_TRACT
  Filled 2023-08-04 (×16): qty 3

## 2023-08-04 MED ORDER — IPRATROPIUM BROMIDE 0.02 % IN SOLN
0.5000 mg | Freq: Three times a day (TID) | RESPIRATORY_TRACT | Status: DC
  Administered 2023-08-04 – 2023-08-09 (×16): 0.5 mg via RESPIRATORY_TRACT
  Filled 2023-08-04 (×16): qty 2.5

## 2023-08-04 NOTE — Progress Notes (Addendum)
  Discussed CODE STATUS with the patient at the bedside.  Patient wished to does not proceed with any chest compression, mechanical ventilation and intubation if situation arises except continue all aggressive medical management include IV fluid and IV antibiotic and if needed transfer to ICU for higher level of care. -CODE STATUS has been changed to FULL code DNR-limited.  -Bedside patient nurse reporting that patient having left-sided musculoskeletal chest pain.  Tylenol is not helping much. Patient is allergic to ibuprofen. I am concerned that given patient has COPD exacerbation there is risk to treat with IV narcotics-morphine, Dilaudid or fentanyl which can cause respiratory depression.  In the ED patient received morphine 4 mg IV one-time dose - Plan to continue Tylenol as needed.  Continue morphine 1 mg every 4 hour as needed for severe 7-10.  Please try to avoid morphine as much as can unless pain is scale is above 7.    Tereasa Coop, MD [facility] 08/04/2023, 2:31 AM

## 2023-08-04 NOTE — TOC Initial Note (Signed)
Transition of Care [facility]) - Initial/Assessment Note    Patient Details  Name: Tracy Park MRN: [id number] Date of Birth: 10/13/1955  Transition of Care [facility]) CM/SW Contact:    Lanier Clam, RN Phone Number: 08/04/2023, 3:02 PM  Clinical Narrative:  Spoke to dtr Monica-d/c plan home. Has [employer] home 02-interested in smaller portable home 02 tanks-[employer] rep Jermaine aware-he will contact Monica tom provide info for portable home 02 through PCP. Has own transport home, & travel home 02 tank.                 Expected Discharge Plan: Home/Self Care Barriers to Discharge: Continued Medical Work up   Patient Goals and CMS Choice Patient states their goals for this hospitalization and ongoing recovery are:: Home CMS Medicare.gov Compare Post Acute Care list provided to:: Patient Represenative (must comment) Choice offered to / list presented to : Adult Children [place] ownership interest in [facility].provided to:: Adult Children    Expected Discharge Plan and Services   Discharge Planning Services: CM Consult   Living arrangements for the past 2 months: Single Family Home Expected Discharge Date: 08/07/23                                    Prior Living Arrangements/Services Living arrangements for the past 2 months: Single Family Home Lives with:: Adult Children              Current home services: DME ([employer] home 02,rwl)    Activities of Daily Living   ADL Screening (condition at time of admission) Independently performs ADLs?: Yes (appropriate for developmental age) Is the patient deaf or have difficulty hearing?: No Does the patient have difficulty seeing, even when wearing glasses/contacts?: No Does the patient have difficulty concentrating, remembering, or making decisions?: No  Permission Sought/Granted                  Emotional Assessment              Admission diagnosis:  COPD exacerbation (HCC) [J44.1] Patient Active  Problem List   Diagnosis Date Noted   History of depression 08/03/2023   Left-sided chest pain 08/03/2023   Rib pain on left side 06/24/2023   Food insecurity 06/05/2023   Left foot pain 04/12/2023   Acute exacerbation of chronic obstructive pulmonary disease (COPD) (HCC) 03/06/2023   Hypocalcemia 03/06/2023   Generalized anxiety disorder 03/06/2023   Anemia of chronic disease 03/06/2023   Paroxysmal atrial fibrillation (HCC) 02/23/2023   Paroxysmal atrial fibrillation with RVR (HCC) 01/25/2023   Pulmonary nodule 01/25/2023   COPD (chronic obstructive pulmonary disease) (HCC) 01/25/2023   Thrombocytosis 03/26/2022   COPD exacerbation (HCC) 03/25/2022   DDD (degenerative disc disease), cervical 03/24/2022   Bipolar disorder (HCC) 03/24/2022   Septic shock (HCC) 03/14/2022   Sepsis due to pneumonia (HCC) 03/13/2022   Dyslipidemia 03/13/2022   Anxiety and depression 03/13/2022   History of lung cancer 03/13/2022   Tobacco abuse 03/13/2022   Pneumonia 02/27/2022   Adenocarcinoma of lung (HCC) 02/26/2022   Leukocytosis 02/26/2022   Chronic hypoxic respiratory failure (HCC) 02/26/2022   Normocytic anemia 02/08/2022   Protein-calorie malnutrition, severe 08/25/2021   Influenza A with pneumonia 08/16/2021   Sepsis (HCC) 08/16/2021   Hyponatremia 08/16/2021   Hypokalemia 08/16/2021   Hypomagnesemia 08/16/2021   Hydronephrosis 05/03/2021   Acute encephalopathy 05/02/2021   CAP (community acquired pneumonia) 04/10/2020  Coronary artery disease involving native coronary artery of native heart without angina pectoris 08/18/2018   Malignant neoplasm of upper lobe of left lung (HCC) 11/12/2016   Chronic bilateral low back pain without sciatica 08/21/2016   Obstructive sleep apnea (adult) (pediatric) 02/22/2015   Hyperlipidemia 06/03/2013   Vitamin D deficiency 06/03/2013   GERD (gastroesophageal reflux disease) 04/28/2012   Hypothyroidism 04/28/2012   Nicotine abuse 04/28/2012    Chest pain, atypical 04/28/2012   PCP:  Jacquelin Hawking, PA-C Pharmacy:   [id number] - [place], [place] - [address] AT [facility] [address] & [address] [place]  [address] Phone: [phone] Fax: [phone]  [place] [facility] [address] [place] [address] Phone: [phone] Fax: [phone]  [id number] [place], [place] - [address] AT [address] [facility] [address] [place] [address] Phone: [phone] Fax: [phone]     Social Determinants of Health (SDOH) Social History: SDOH Screenings   Food Insecurity: No Food Insecurity (08/04/2023)  Recent Concern: Food Insecurity - Food Insecurity Present (06/20/2023)  Housing: Patient Declined (08/04/2023)  Transportation Needs: No Transportation Needs (08/04/2023)  Utilities: Not At Risk (08/04/2023)  Financial Resource Strain: Medium Risk (04/07/2023)   Received from [facility]  Physical Activity: Unknown (04/07/2023)   Received from [facility]  Social Connections: Moderately Integrated (04/07/2023)   Received from [facility]  Stress: Stress Concern Present (04/07/2023)   Received from [facility]  Tobacco Use: High Risk (08/04/2023)   SDOH Interventions:     Readmission Risk Interventions    07/27/2023   12:06 PM 04/29/2023    2:02 PM 04/17/2023    4:34 PM  Readmission Risk Prevention Plan  Transportation Screening Complete Complete Complete  Medication Review Oceanographer) Complete Complete Complete  PCP or Specialist appointment within 3-5 days of discharge  Complete Complete  HRI or Home Care Consult Complete Complete Complete  SW Recovery Care/Counseling Consult Complete Complete Complete  Palliative Care Screening Not Applicable Not Applicable Not Applicable  Skilled Nursing Facility Not Applicable Not Applicable Not Applicable

## 2023-08-04 NOTE — Progress Notes (Signed)
Mobility Specialist - Progress Note  (2L [place]) Pre-mobility: 80 bpm HR, 96% SpO2 During mobility: 88 bpm HR, 88% SpO2 Post-mobility: 69 bpm HR, 90% SPO2   08/04/23 1445  Mobility  Activity Ambulated independently in hallway  Level of Assistance Independent after set-up  Assistive Device None  Distance Ambulated (ft) 180 ft  Range of Motion/Exercises Active  Activity Response Tolerated fair  Mobility Referral Yes  $Mobility charge 1 Mobility  Mobility Specialist Start Time (ACUTE ONLY) 1430  Mobility Specialist Stop Time (ACUTE ONLY) 1445  Mobility Specialist Time Calculation (min) (ACUTE ONLY) 15 min   Pt was found in bed and agreeable to ambulate. C/o chest pain 8/10 during ambulation. Stated feeling SOB as well. At EOS returned to bed with all needs met. Call bell in reach and RN notified of session.   Billey Chang Mobility Specialist

## 2023-08-04 NOTE — Progress Notes (Signed)
HOSPITALIST ROUNDING NOTE [place] Ewald Eisenbeis VWU:[id number]  DOB: 1955-10-16  DOA: 08/03/2023  PCP: Jacquelin Hawking, PA-C  08/04/2023,8:36 AM   LOS: 1 day      Code Status: DNR-limited  From: Home  current Dispo: Home     67 year old female stage III LUL adenocarcinoma + lobectomy 2018 adjunct chemo 2019-last follow-up-chronic COPD still smoker on Breztri albuterol follows with Dr. Cristi Loron pulmonology--apparently on 2 L chronic oxygen Paroxysmal A-fib Eliquis-mild coronary artery disease on coronary CT 2019 calcium score 101 HTN HLD BMI 17 Was recently admitted 11/14 through 11/18 COPD exacerbation discharged without oxygen Return to ED left-sided chest pain as well as cough-was given Solu-Medrol morphine DuoNebs given that she was tachypneic-EKG was nonischemic and troponins were negative Ct chest showed no pulmonary embolism mild coronary artery calcification stable bronchial diffuse thickening and emphysema with no pulmonary embolus Sodium 136 potassium 4.1 BUN/creatinine 20/0.5 LFTs normal BNP 68--WBC 11 platelet 263   Plan  Acute exacerbation of COPD, still smoker, goal COPD stage C/D Uses 24/7 oxygen 2 L-as still severe wheeze Solu-Medrol 60 IV for now, continue DuoNeb every 4 Atrovent 3 times daily Xopenex 3 times daily May use Tessalon additionally Will need several days to resolve May benefit from discussion with palliative care given multiple admissions advanced COPD  Underlying LUL stage IIIa adenocarcinoma's + lobectomy + neoadjuvant chemo in the past Still smoker Counseled to stop smoking Lost to follow-up with oncology and may need to follow-up with them in the outpatient setting should follow-up with regular pulmonologist Dr. Jesse Fall  Paroxysmal A-fib on Eliquis CHADVASC >3 Continue metoprolol XL 12.5, apixaban 5 twice daily  Underlying chronic pleurisy with mild disease on coronary CT 2019 Noncardiac in etiology BNP 68 troponin trend flat--no further workup needs  outpatient consideration for stress Avoid IV morphine May use low-dose Tylenol 3  Anxiety Continue Zoloft 20 5 AM  Hypothyroid continue Synthroid 125  HTN HLD BMI 17 Needs to increase her diet   DVT prophylaxis: Apixaban  Status is: Inpatient Remains inpatient appropriate because:   Requires further management of COPD and improvement  Subjective: Awake coherent no distress looks well feels well Still having some central chest pain which is reproducible Some sputum  Objective + exam Vitals:   08/03/23 2355 08/04/23 0116 08/04/23 0314 08/04/23 0709  BP:   104/60 100/62  Pulse: (!) 50  (!) 52 (!) 53  Resp:   16 20  Temp:   98.2 F (36.8 C) 98.4 F (36.9 C)  TempSrc:   Oral Oral  SpO2: 92% 99% 98% 97%  Weight:      Height:       Filed Weights   08/03/23 1633 08/03/23 2317  Weight: 44.5 kg 44.3 kg    Examination:  EOMI NCAT frail cachectic white female looking much older than stated age Wheeze throughout posterior lateral lung fields no rales or rhonchi S1-S2 no murmur seems to be in sinus-telemetry discontinued Abdomen soft no rebound no guarding ROM intact No lower extremity edema  Data Reviewed: reviewed   CBC    Component Value Date/Time   WBC 6.1 08/04/2023 0422   RBC 4.14 08/04/2023 0422   HGB 12.3 08/04/2023 0422   HCT 40.4 08/04/2023 0422   PLT 283 08/04/2023 0422   MCV 97.6 08/04/2023 0422   MCH 29.7 08/04/2023 0422   MCHC 30.4 08/04/2023 0422   RDW 12.4 08/04/2023 0422   LYMPHSABS 3.2 08/03/2023 1723   MONOABS 1.1 (H) 08/03/2023 1723   EOSABS  0.1 08/03/2023 1723   BASOSABS 0.0 08/03/2023 1723      Latest Ref Rng & Units 08/04/2023    4:22 AM 08/03/2023    5:23 PM 07/25/2023    5:49 AM  CMP  Glucose 70 - 99 mg/dL 161  99  096   BUN 8 - 23 mg/dL 21  20  14    Creatinine 0.44 - 1.00 mg/dL 0.45  4.09  8.11   Sodium 135 - 145 mmol/L 136  140  134   Potassium 3.5 - 5.1 mmol/L 4.1  3.9  4.5   Chloride 98 - 111 mmol/L 98  99  99   CO2 22  - 32 mmol/L 32  33  27   Calcium 8.9 - 10.3 mg/dL 8.8  9.0  9.2   Total Protein 6.5 - 8.1 g/dL 6.5  6.7    Total Bilirubin <1.2 mg/dL 0.2  0.3    Alkaline Phos 38 - 126 U/L 67  70    AST 15 - 41 U/L 16  14    ALT 0 - 44 U/L 14  15       Scheduled Meds:  apixaban  5 mg Oral BID   benzonatate  100 mg Oral TID   ipratropium  0.5 mg Nebulization TID   levalbuterol  0.63 mg Nebulization TID   levothyroxine  125 mcg Oral QAC breakfast   methylPREDNISolone (SOLU-MEDROL) injection  60 mg Intravenous Daily   metoprolol succinate  12.5 mg Oral Daily   pantoprazole  40 mg Oral Daily   rosuvastatin  20 mg Oral Daily   sertraline  25 mg Oral q AM   sodium chloride flush  3 mL Intravenous Q12H   Continuous Infusions:  sodium chloride      Time 60  Rhetta Mura, MD  [facility]

## 2023-08-05 DIAGNOSIS — J441 Chronic obstructive pulmonary disease with (acute) exacerbation: Secondary | ICD-10-CM | POA: Diagnosis not present

## 2023-08-05 LAB — CBC WITH DIFFERENTIAL/PLATELET
Abs Immature Granulocytes: 0.07 10*3/uL (ref 0.00–0.07)
Basophils Absolute: 0 10*3/uL (ref 0.0–0.1)
Basophils Relative: 0 %
Eosinophils Absolute: 0 10*3/uL (ref 0.0–0.5)
Eosinophils Relative: 0 %
HCT: 37.7 % (ref 36.0–46.0)
Hemoglobin: 11.8 g/dL — ABNORMAL LOW (ref 12.0–15.0)
Immature Granulocytes: 0 %
Lymphocytes Relative: 17 %
Lymphs Abs: 2.7 10*3/uL (ref 0.7–4.0)
MCH: 29.9 pg (ref 26.0–34.0)
MCHC: 31.3 g/dL (ref 30.0–36.0)
MCV: 95.4 fL (ref 80.0–100.0)
Monocytes Absolute: 1 10*3/uL (ref 0.1–1.0)
Monocytes Relative: 6 %
Neutro Abs: 11.9 10*3/uL — ABNORMAL HIGH (ref 1.7–7.7)
Neutrophils Relative %: 77 %
Platelets: 261 10*3/uL (ref 150–400)
RBC: 3.95 MIL/uL (ref 3.87–5.11)
RDW: 12.4 % (ref 11.5–15.5)
WBC: 15.7 10*3/uL — ABNORMAL HIGH (ref 4.0–10.5)
nRBC: 0 % (ref 0.0–0.2)

## 2023-08-05 LAB — COMPREHENSIVE METABOLIC PANEL
ALT: 12 U/L (ref 0–44)
AST: 12 U/L — ABNORMAL LOW (ref 15–41)
Albumin: 3.3 g/dL — ABNORMAL LOW (ref 3.5–5.0)
Alkaline Phosphatase: 65 U/L (ref 38–126)
Anion gap: 4 — ABNORMAL LOW (ref 5–15)
BUN: 24 mg/dL — ABNORMAL HIGH (ref 8–23)
CO2: 34 mmol/L — ABNORMAL HIGH (ref 22–32)
Calcium: 8.8 mg/dL — ABNORMAL LOW (ref 8.9–10.3)
Chloride: 99 mmol/L (ref 98–111)
Creatinine, Ser: 0.72 mg/dL (ref 0.44–1.00)
GFR, Estimated: >60 mL/min (ref >60)
Glucose, Bld: 103 mg/dL — ABNORMAL HIGH (ref 70–99)
Potassium: 3.8 mmol/L (ref 3.5–5.1)
Sodium: 137 mmol/L (ref 135–145)
Total Bilirubin: 0.4 mg/dL (ref <1.2)
Total Protein: 6.2 g/dL — ABNORMAL LOW (ref 6.5–8.1)

## 2023-08-05 MED ORDER — AZITHROMYCIN 500 MG PO TABS
500.0000 mg | ORAL_TABLET | Freq: Every day | ORAL | Status: AC
  Administered 2023-08-05: 500 mg via ORAL
  Filled 2023-08-05: qty 1

## 2023-08-05 MED ORDER — DICLOFENAC EPOLAMINE 1.3 % EX PTCH
1.0000 | MEDICATED_PATCH | Freq: Two times a day (BID) | CUTANEOUS | Status: DC
  Administered 2023-08-05 – 2023-08-09 (×7): 1 via TRANSDERMAL
  Filled 2023-08-05 (×9): qty 1

## 2023-08-05 MED ORDER — AZITHROMYCIN 250 MG PO TABS
250.0000 mg | ORAL_TABLET | Freq: Every day | ORAL | Status: AC
  Administered 2023-08-06 – 2023-08-09 (×4): 250 mg via ORAL
  Filled 2023-08-05 (×4): qty 1

## 2023-08-05 NOTE — Progress Notes (Signed)
Mobility Specialist - Progress Note  Pre-mobility: 69 bpm HR, 97% SpO2 During mobility: 78 bpm HR, 92% SpO2 Post-mobility: 83 bpm HR, 90% SPO2   08/05/23 1010  Mobility  Activity Ambulated independently in hallway  Level of Assistance Standby assist, set-up cues, supervision of patient - no hands on  Assistive Device None  Distance Ambulated (ft) 100 ft  Range of Motion/Exercises Active  Activity Response Tolerated fair  Mobility Referral Yes  $Mobility charge 1 Mobility  Mobility Specialist Start Time (ACUTE ONLY) 1000  Mobility Specialist Stop Time (ACUTE ONLY) 1010  Mobility Specialist Time Calculation (min) (ACUTE ONLY) 10 min   Pt was found in bed and agreeable to ambulate. Stated having L sided chest pain. At EOS returned to bed with all needs met. Call bell in reach and all needs met.  Billey Chang Mobility Specialist

## 2023-08-05 NOTE — Progress Notes (Signed)
HOSPITALIST ROUNDING NOTE [place] UJW:[id number]  DOB: Jun 27, 1956  DOA: 08/03/2023  PCP: Jacquelin Hawking, PA-C  08/05/2023,2:18 PM   LOS: 2 days      Code Status: DNR-limited  From: Home  current Dispo: Home     67 year old female stage III LUL adenocarcinoma + lobectomy 2018 adjunct chemo 2019-last follow-up-chronic COPD still smoker on Breztri albuterol follows with Dr. Cristi Loron pulmonology--apparently on 2 L chronic oxygen Paroxysmal A-fib Eliquis-mild coronary artery disease on coronary CT 2019 calcium score 101 HTN HLD BMI 17 Was recently admitted 11/14 through 11/18 COPD exacerbation discharged without oxygen Return to ED left-sided chest pain as well as cough-was given Solu-Medrol morphine DuoNebs given that she was tachypneic-EKG was nonischemic and troponins were negative Ct chest showed no pulmonary embolism mild coronary artery calcification stable bronchial diffuse thickening and emphysema with no pulmonary embolus Sodium 136 potassium 4.1 BUN/creatinine 20/0.5 LFTs normal BNP 68--WBC 11 platelet 263   Plan  Acute exacerbation of COPD, still smoker, goal COPD stage C/D Uses 24/7 oxygen 2 L-transition IV Solu-Medrol to prednisone this afternoon May benefit from discussion with palliative care as OP given multiple admissions advanced COPD Per desat screen does not seem to require oxygen?   Underlying LUL stage IIIa adenocarcinoma's + lobectomy + neoadjuvant chemo in the past Still smoker Counseled to stop smoking Lost to follow-up with oncology --OP follow-up regular pulmonologist Dr. Jerre Simon  Paroxysmal A-fib on Eliquis CHADVASC >3 Continue metoprolol XL 12.5, apixaban 5 twice daily  Underlying chronic pleurisy with mild disease on coronary CT 2019 Noncardiac in etiology BNP 68 troponin trend flat--no further workup needs outpatient consideration for stress Avoid IV morphine May use low-dose Tylenol 3 Will add Flector patch to the front of chest-would not  workup further in-house  Anxiety Continue Zoloft 20 5 AM  Hypothyroid continue Synthroid 125  HTN HLD BMI 17 Needs to increase her diet   DVT prophylaxis: Apixaban  Status is: Inpatient Remains inpatient appropriate because:   Requires further management of COPD and improvement  Subjective:  Chest pain still present but a little less Still wheezy and uncomfortable with walking Some sputum no fever no chills  Objective + exam Vitals:   08/04/23 2015 08/05/23 0456 08/05/23 0900 08/05/23 1230  BP: 105/69 119/77  101/63  Pulse: 67 70  67  Resp: 18   16  Temp: 98.2 F (36.8 C) 98.1 F (36.7 C)  98.2 F (36.8 C)  TempSrc: Oral Oral    SpO2:  98% 98% 98%  Weight:      Height:       Filed Weights   08/03/23 1633 08/03/23 2317  Weight: 44.5 kg 44.3 kg    Examination:  EOMI NCAT frail cachectic white female looking much older than stated age Wheeze throughout a little less than yesterday but still impressive S1-S2 no murmur Abdomen soft no rebound No edema   Data Reviewed: reviewed   CBC    Component Value Date/Time   WBC 15.7 (H) 08/05/2023 0450   RBC 3.95 08/05/2023 0450   HGB 11.8 (L) 08/05/2023 0450   HCT 37.7 08/05/2023 0450   PLT 261 08/05/2023 0450   MCV 95.4 08/05/2023 0450   MCH 29.9 08/05/2023 0450   MCHC 31.3 08/05/2023 0450   RDW 12.4 08/05/2023 0450   LYMPHSABS 2.7 08/05/2023 0450   MONOABS 1.0 08/05/2023 0450   EOSABS 0.0 08/05/2023 0450   BASOSABS 0.0 08/05/2023 0450      Latest Ref Rng & Units  08/05/2023    4:50 AM 08/04/2023    4:22 AM 08/03/2023    5:23 PM  CMP  Glucose 70 - 99 mg/dL 161  096  99   BUN 8 - 23 mg/dL 24  21  20    Creatinine 0.44 - 1.00 mg/dL 0.45  4.09  8.11   Sodium 135 - 145 mmol/L 137  136  140   Potassium 3.5 - 5.1 mmol/L 3.8  4.1  3.9   Chloride 98 - 111 mmol/L 99  98  99   CO2 22 - 32 mmol/L 34  32  33   Calcium 8.9 - 10.3 mg/dL 8.8  8.8  9.0   Total Protein 6.5 - 8.1 g/dL 6.2  6.5  6.7   Total  Bilirubin <1.2 mg/dL 0.4  0.2  0.3   Alkaline Phos 38 - 126 U/L 65  67  70   AST 15 - 41 U/L 12  16  14    ALT 0 - 44 U/L 12  14  15       Scheduled Meds:  apixaban  5 mg Oral BID   benzonatate  100 mg Oral TID   ipratropium  0.5 mg Nebulization TID   levalbuterol  0.63 mg Nebulization TID   levothyroxine  125 mcg Oral QAC breakfast   methylPREDNISolone (SOLU-MEDROL) injection  60 mg Intravenous Daily   metoprolol succinate  12.5 mg Oral Daily   pantoprazole  40 mg Oral Daily   rosuvastatin  20 mg Oral Daily   sertraline  25 mg Oral q AM   sodium chloride flush  3 mL Intravenous Q12H   Continuous Infusions:    Time 60  Rhetta Mura, MD  [facility]

## 2023-08-05 NOTE — Progress Notes (Signed)
Pt was able to ambulate in hall from room to nurses station with 2L/[place] maintaining 93% without WOB. Reva Bores 08/05/23 3:34 PM

## 2023-08-05 NOTE — Plan of Care (Signed)
  Problem: Clinical Measurements: Goal: Will remain free from infection Outcome: Progressing Goal: Diagnostic test results will improve Outcome: Progressing   Problem: Activity: Goal: Risk for activity intolerance will decrease Outcome: Progressing   Problem: Coping: Goal: Level of anxiety will decrease Outcome: Progressing   Problem: Safety: Goal: Ability to remain free from injury will improve Outcome: Progressing

## 2023-08-06 ENCOUNTER — Inpatient Hospital Stay (HOSPITAL_COMMUNITY): Payer: 59

## 2023-08-06 DIAGNOSIS — J441 Chronic obstructive pulmonary disease with (acute) exacerbation: Secondary | ICD-10-CM | POA: Diagnosis not present

## 2023-08-06 LAB — COMPREHENSIVE METABOLIC PANEL WITH GFR
ALT: 11 U/L (ref 0–44)
AST: 12 U/L — ABNORMAL LOW (ref 15–41)
Albumin: 3.4 g/dL — ABNORMAL LOW (ref 3.5–5.0)
Alkaline Phosphatase: 59 U/L (ref 38–126)
Anion gap: 9 (ref 5–15)
BUN: 24 mg/dL — ABNORMAL HIGH (ref 8–23)
CO2: 33 mmol/L — ABNORMAL HIGH (ref 22–32)
Calcium: 9 mg/dL (ref 8.9–10.3)
Chloride: 99 mmol/L (ref 98–111)
Creatinine, Ser: 0.65 mg/dL (ref 0.44–1.00)
GFR, Estimated: >60 mL/min
Glucose, Bld: 93 mg/dL (ref 70–99)
Potassium: 3.8 mmol/L (ref 3.5–5.1)
Sodium: 141 mmol/L (ref 135–145)
Total Bilirubin: 0.4 mg/dL
Total Protein: 6.2 g/dL — ABNORMAL LOW (ref 6.5–8.1)

## 2023-08-06 LAB — CBC WITH DIFFERENTIAL/PLATELET
Abs Immature Granulocytes: 0.11 10*3/uL — ABNORMAL HIGH (ref 0.00–0.07)
Basophils Absolute: 0 10*3/uL (ref 0.0–0.1)
Basophils Relative: 0 %
Eosinophils Absolute: 0 10*3/uL (ref 0.0–0.5)
Eosinophils Relative: 0 %
HCT: 36.6 % (ref 36.0–46.0)
Hemoglobin: 11.7 g/dL — ABNORMAL LOW (ref 12.0–15.0)
Immature Granulocytes: 1 %
Lymphocytes Relative: 23 %
Lymphs Abs: 3.2 10*3/uL (ref 0.7–4.0)
MCH: 30 pg (ref 26.0–34.0)
MCHC: 32 g/dL (ref 30.0–36.0)
MCV: 93.8 fL (ref 80.0–100.0)
Monocytes Absolute: 0.9 10*3/uL (ref 0.1–1.0)
Monocytes Relative: 7 %
Neutro Abs: 9.8 10*3/uL — ABNORMAL HIGH (ref 1.7–7.7)
Neutrophils Relative %: 69 %
Platelets: 233 10*3/uL (ref 150–400)
RBC: 3.9 MIL/uL (ref 3.87–5.11)
RDW: 12.4 % (ref 11.5–15.5)
WBC: 14.1 10*3/uL — ABNORMAL HIGH (ref 4.0–10.5)
nRBC: 0 % (ref 0.0–0.2)

## 2023-08-06 LAB — TROPONIN I (HIGH SENSITIVITY)
Troponin I (High Sensitivity): 6 ng/L
Troponin I (High Sensitivity): 6 ng/L (ref <18)

## 2023-08-06 MED ORDER — ARFORMOTEROL TARTRATE 15 MCG/2ML IN NEBU
15.0000 ug | INHALATION_SOLUTION | Freq: Two times a day (BID) | RESPIRATORY_TRACT | Status: DC
  Administered 2023-08-06 – 2023-08-09 (×7): 15 ug via RESPIRATORY_TRACT
  Filled 2023-08-06 (×7): qty 2

## 2023-08-06 MED ORDER — BUDESONIDE 0.25 MG/2ML IN SUSP
0.2500 mg | Freq: Two times a day (BID) | RESPIRATORY_TRACT | Status: DC
  Administered 2023-08-06 – 2023-08-09 (×7): 0.25 mg via RESPIRATORY_TRACT
  Filled 2023-08-06 (×7): qty 2

## 2023-08-06 NOTE — Progress Notes (Signed)
PROGRESS NOTE CHRIST ERLICH  FAO:[id number] DOB: 10/16/1955 DOA: 08/03/2023 PCP: Jacquelin Hawking, PA-C  Brief Narrative/Hospital Course: [phone] w/ remote lung cancer status post + lobectomy 2018 adjunct chemo 2019-last follow-up-chronic COPD still smoker on Breztri albuterol follows with Dr. Cristi Loron pulmonology--apparently on 2 L chronic oxygen, Paroxysmal A-fib Eliquis-mild coronary artery disease on coronary CT 2019 calcium score 101 HTN HLD BMI 17, w/ recent admission:11/14-11/18 for COPD exacerbation presented to the ED with left-sided chest pain, cough In the ED tachypneic, EKG without ischemia troponins negative. CT chest>>no pulmonary embolism mild coronary artery calcification stable bronchial diffuse thickening and emphysema with no pulmonary embolus.  Patient was admitted for acute COPD exacerbation.  Subjective: Patient seen and examined Alert awake resting Overnight afebrile BP stable on 2 L nasal cannula C/o bereathing improving, but still chest pain worse with deep breath, chest tightness Just had CXR done.  Assessment and Plan: Principal Problem:   COPD exacerbation (HCC) Active Problems:   Chronic hypoxic respiratory failure (HCC)   Paroxysmal atrial fibrillation (HCC)   Hypothyroidism   History of lung cancer   Bipolar disorder (HCC)   Hyperlipidemia   Generalized anxiety disorder   History of depression   Left-sided chest pain  Acute exacerbation of COPD Chronic active smoker COPD stage C/D remote lung cancer status post + lobectomy 2018 adjunct chemo 2019: Overall afebrile vitals stable needing 2 L [place].  Managing with azithromycin, nebulizer 3 times daily, Solu-Medrol IV.on BRETRI at home-will place on Brovana/Pulmicort neb. Having chest tightness and pain worse with inspiration- low risk for PE already on Eliquis. CXR thss am pending, check troponins x2. Continue antitussives.  Continue to counsel to smoking given patient's lung cancer history advised to  follow-up with outpatient oncology and pulmonary closely.  PAF: On Eliquis and Toprol.  Continue same  Underlying chronic pleurisy with mild disease of coronary CT 2019: EKG nonischemic, BNP troponin flat> culture and outpatient workup like stress test continue pain management  Anxiety disorder: Continue Zoloft.  Hypothyroidism: on Synthroid.  HLD: Continue statin.  Body mass index is 17.3 kg/m.  Encourage to augment diet  DVT prophylaxis: Eliquis Code Status:   Code Status: Limited: Do not attempt resuscitation (DNR) -DNR-LIMITED -Do Not Intubate/DNI  Family Communication: plan of care discussed with patient/no family at bedside. Patient status is:  inpatient because of copd exacerbation Level of care: Med-Surg   Dispo: The patient is from: home, living with daughter currently            Anticipated disposition: TBD Objective: Vitals last 24 hrs: Vitals:   08/05/23 2013 08/06/23 0224 08/06/23 0530 08/06/23 0744  BP: 114/66  126/77   Pulse: 65  67   Resp: 18  20   Temp: 98.2 F (36.8 C)  98.1 F (36.7 C)   TempSrc: Oral  Oral   SpO2: 98% 97% 97% 96%  Weight:      Height:       Weight change:   Physical Examination: General exam: alert awake, frail, HEENT:Oral mucosa moist, Ear/Nose WNL grossly Respiratory system: Bilaterally air movement present but has expiratory and inspiratory wheezing, deep breath triggers cough during auscultation no use of accessory muscle Cardiovascular system: S1 & S2 +, No JVD. Gastrointestinal system: Abdomen soft,NT,ND, BS+ Nervous System: Alert, awake, moving all extremities,and following commands. Extremities: LE edema neg,distal peripheral pulses palpable and warm.  Skin: No rashes,no icterus. MSK: Normal muscle bulk,tone, power.  Medications reviewed:  Scheduled Meds:  apixaban  5 mg Oral BID  azithromycin  250 mg Oral Daily   benzonatate  100 mg Oral TID   diclofenac  1 patch Transdermal BID   ipratropium  0.5 mg  Nebulization TID   levalbuterol  0.63 mg Nebulization TID   levothyroxine  125 mcg Oral QAC breakfast   methylPREDNISolone (SOLU-MEDROL) injection  60 mg Intravenous Daily   metoprolol succinate  12.5 mg Oral Daily   pantoprazole  40 mg Oral Daily   rosuvastatin  20 mg Oral Daily   sertraline  25 mg Oral q AM   sodium chloride flush  3 mL Intravenous Q12H   Continuous Infusions:   Diet Order             Diet Heart Room service appropriate? Yes; Fluid consistency: Thin  Diet effective now                   Intake/Output Summary (Last 24 hours) at 08/06/2023 1037 Last data filed at 08/06/2023 0200 Gross per 24 hour  Intake 360 ml  Output --  Net 360 ml   Net IO Since Admission: 600 mL [08/06/23 1037]  Wt Readings from Last 3 Encounters:  08/03/23 44.3 kg  07/24/23 44.5 kg  06/26/23 49.3 kg     Unresulted Labs (From admission, onward)     Start     Ordered   08/05/23 0500  CBC with Differential/Platelet  Daily,   R     Question:  Specimen collection method  Answer:  Lab=Lab collect   08/04/23 1751   08/05/23 0500  Comprehensive metabolic panel  Daily,   R     Question:  Specimen collection method  Answer:  Lab=Lab collect   08/04/23 1751   08/03/23 2202  MRSA Next Gen by PCR, Nasal  (COPD / Pneumonia / Cellulitis / Lower Extremity Wound)  Once,   R        08/03/23 2203          Data Reviewed: I have personally reviewed following labs and imaging studies CBC: Recent Labs  Lab 08/03/23 1723 08/04/23 0422 08/05/23 0450 08/06/23 0503  WBC 11.8* 6.1 15.7* 14.1*  NEUTROABS 7.4  --  11.9* 9.8*  HGB 13.0 12.3 11.8* 11.7*  HCT 40.7 40.4 37.7 36.6  MCV 96.7 97.6 95.4 93.8  PLT 263 283 261 233   Basic Metabolic Panel:  Recent Labs  Lab 08/03/23 1723 08/04/23 0422 08/05/23 0450 08/06/23 0503  NA 140 136 137 141  K 3.9 4.1 3.8 3.8  CL 99 98 99 99  CO2 33* 32 34* 33*  GLUCOSE 99 222* 103* 93  BUN 20 21 24* 24*  CREATININE 0.57 0.56 0.72 0.65  CALCIUM  9.0 8.8* 8.8* 9.0   GFR: Estimated Creatinine Clearance: 49 mL/min (by C-G formula based on SCr of 0.65 mg/dL). Liver Function Tests:  Recent Labs  Lab 08/03/23 1723 08/04/23 0422 08/05/23 0450 08/06/23 0503  AST 14* 16 12* 12*  ALT 15 14 12 11   ALKPHOS 70 67 65 59  BILITOT 0.3 0.2 0.4 0.4  PROT 6.7 6.5 6.2* 6.2*  ALBUMIN 3.6 3.6 3.3* 3.4*  Sepsis Labs: No results for input(s): "PROCALCITON", "LATICACIDVEN" in the last 168 hours.  Recent Results (from the past 240 hour(s))  Resp panel by RT-PCR (RSV, Flu A&B, Covid) Anterior Nasal Swab     Status: None   Collection Time: 08/03/23 10:15 PM   Specimen: Anterior Nasal Swab  Result Value Ref Range Status   SARS Coronavirus 2 by RT  PCR NEGATIVE NEGATIVE Final    Comment: (NOTE) SARS-CoV-2 target nucleic acids are NOT DETECTED.  The SARS-CoV-2 RNA is generally detectable in upper respiratory specimens during the acute phase of infection. The lowest concentration of SARS-CoV-2 viral copies this assay can detect is 138 copies/mL. A negative result does not preclude SARS-Cov-2 infection and should not be used as the sole basis for treatment or other patient management decisions. A negative result may occur with  improper specimen collection/handling, submission of specimen other than nasopharyngeal swab, presence of viral mutation(s) within the areas targeted by this assay, and inadequate number of viral copies(<138 copies/mL). A negative result must be combined with clinical observations, patient history, and epidemiological information. The expected result is Negative.  Fact Sheet for Patients:  BloggerCourse.com  Fact Sheet for Healthcare Providers:  SeriousBroker.it  This test is no t yet approved or cleared by the [place] FDA and  has been authorized for detection and/or diagnosis of SARS-CoV-2 by FDA under an Emergency Use Authorization (EUA). This EUA will remain   in effect (meaning this test can be used) for the duration of the COVID-19 declaration under Section 564(b)(1) of the Act, 21 U.S.C.section 360bbb-3(b)(1), unless the authorization is terminated  or revoked sooner.       Influenza A by PCR NEGATIVE NEGATIVE Final   Influenza B by PCR NEGATIVE NEGATIVE Final    Comment: (NOTE) The Xpert Xpress SARS-CoV-2/FLU/RSV plus assay is intended as an aid in the diagnosis of influenza from Nasopharyngeal swab specimens and should not be used as a sole basis for treatment. Nasal washings and aspirates are unacceptable for Xpert Xpress SARS-CoV-2/FLU/RSV testing.  Fact Sheet for Patients: BloggerCourse.com  Fact Sheet for Healthcare Providers: SeriousBroker.it  This test is not yet approved or cleared by the [place] FDA and has been authorized for detection and/or diagnosis of SARS-CoV-2 by FDA under an Emergency Use Authorization (EUA). This EUA will remain in effect (meaning this test can be used) for the duration of the COVID-19 declaration under Section 564(b)(1) of the Act, 21 U.S.C. section 360bbb-3(b)(1), unless the authorization is terminated or revoked.     Resp Syncytial Virus by PCR NEGATIVE NEGATIVE Final    Comment: (NOTE) Fact Sheet for Patients: BloggerCourse.com  Fact Sheet for Healthcare Providers: SeriousBroker.it  This test is not yet approved or cleared by the [place] FDA and has been authorized for detection and/or diagnosis of SARS-CoV-2 by FDA under an Emergency Use Authorization (EUA). This EUA will remain in effect (meaning this test can be used) for the duration of the COVID-19 declaration under Section 564(b)(1) of the Act, 21 U.S.C. section 360bbb-3(b)(1), unless the authorization is terminated or revoked.  Performed at [facility], [address], [place], [place]  [address]     Antimicrobials: Anti-infectives (From admission, onward)    Start     Dose/Rate Route Frequency Ordered Stop   08/06/23 1000  azithromycin (ZITHROMAX) tablet 250 mg       Placed in "Followed by" Linked Group   250 mg Oral Daily 08/05/23 1751 08/10/23 0959   08/05/23 1845  azithromycin (ZITHROMAX) tablet 500 mg       Placed in "Followed by" Linked Group   500 mg Oral Daily 08/05/23 1751 08/05/23 1847      Culture/Microbiology    Component Value Date/Time   SDES EXPECTORATED SPUTUM 06/20/2023 1228   SDES  06/20/2023 1228    EXPECTORATED SPUTUM Performed at [facility], [address], [place] [address]    SPECREQUEST NONE 06/20/2023 1228   SPECREQUEST  06/20/2023 1228    NONE Reflexed from [id number] Performed at [facility], [address] [place], [place] [address]    CULT FEW PSEUDOMONAS AERUGINOSA 06/20/2023 1228   REPTSTATUS 06/20/2023 FINAL 06/20/2023 1228   REPTSTATUS 06/25/2023 FINAL 06/20/2023 1228    Radiology Studies: No results found.   LOS: 3 days   Total time spent in review of labs and imaging, patient evaluation, formulation of plan, documentation and communication with family: 35 minutes  Lanae Boast, MD [facility]  08/06/2023, 10:37 AM

## 2023-08-06 NOTE — Progress Notes (Signed)
Mobility Specialist - Progress Note   08/06/23 0900  Mobility  Activity Ambulated with assistance in hallway  Level of Assistance Contact guard assist, steadying assist  Assistive Device None  Distance Ambulated (ft) 450 ft  Range of Motion/Exercises Active  Activity Response Tolerated well  Mobility Referral Yes  $Mobility charge 1 Mobility  Mobility Specialist Start Time (ACUTE ONLY) 0930  Mobility Specialist Stop Time (ACUTE ONLY) 0943  Mobility Specialist Time Calculation (min) (ACUTE ONLY) 13 min   Received in bed and agreed to mobility. Pt had some c/o slight SOB, saturations hovered above 94%. Returned to bed with all needs met.  Marilynne Halsted Mobility Specialist

## 2023-08-06 NOTE — Plan of Care (Addendum)
Patient AO X 4, VSS, complains of some pain, minimal relief though avoiding PRN morphine as much as possible. On 2L [place], dyspnea with activity and expiratory wheeze. Minimally productive cough. Generalized weakness. Reports some constipation. Ambulates in room and SBA in hallway.   Problem: Education: Goal: Knowledge of General Education information will improve Description: Including pain rating scale, medication(s)/side effects and non-pharmacologic comfort measures Outcome: Progressing   Problem: Health Behavior/Discharge Planning: Goal: Ability to manage health-related needs will improve Outcome: Progressing   Problem: Clinical Measurements: Goal: Ability to maintain clinical measurements within normal limits will improve Outcome: Progressing Goal: Will remain free from infection Outcome: Progressing Goal: Diagnostic test results will improve Outcome: Progressing Goal: Respiratory complications will improve Outcome: Progressing Goal: Cardiovascular complication will be avoided Outcome: Progressing   Problem: Activity: Goal: Risk for activity intolerance will decrease Outcome: Progressing   Problem: Nutrition: Goal: Adequate nutrition will be maintained Outcome: Progressing   Problem: Coping: Goal: Level of anxiety will decrease Outcome: Progressing   Problem: Elimination: Goal: Will not experience complications related to bowel motility Outcome: Progressing Goal: Will not experience complications related to urinary retention Outcome: Progressing   Problem: Pain Management: Goal: General experience of comfort will improve Outcome: Progressing   Problem: Safety: Goal: Ability to remain free from injury will improve Outcome: Progressing   Problem: Skin Integrity: Goal: Risk for impaired skin integrity will decrease Outcome: Progressing   Problem: Education: Goal: Knowledge of disease or condition will improve Outcome: Progressing Goal: Knowledge of the  prescribed therapeutic regimen will improve Outcome: Progressing Goal: Individualized Educational Video(s) Outcome: Progressing   Problem: Activity: Goal: Ability to tolerate increased activity will improve Outcome: Progressing Goal: Will verbalize the importance of balancing activity with adequate rest periods Outcome: Progressing   Problem: Respiratory: Goal: Ability to maintain a clear airway will improve Outcome: Progressing Goal: Levels of oxygenation will improve Outcome: Progressing Goal: Ability to maintain adequate ventilation will improve Outcome: Progressing

## 2023-08-06 NOTE — Hospital Course (Addendum)
[id number] w/ remote lung cancer status post + lobectomy 2018 adjunct chemo 2019-last follow-up-chronic COPD still smoker on Breztri albuterol follows with Dr. Cristi Loron pulmonology--apparently on 2 L chronic oxygen, Paroxysmal A-fib Eliquis-mild coronary artery disease on coronary CT 2019 calcium score 101 HTN HLD BMI 17, w/ recent admission:11/14-11/18 for COPD exacerbation presented to the ED with left-sided chest pain, cough In the ED tachypneic, EKG without ischemia troponins negative. CT chest>>no pulmonary embolism mild coronary artery calcification stable bronchial diffuse thickening and emphysema with no pulmonary embolus.  Patient was admitted for acute COPD exacerbation.  Patient was treated with IV steroids bronchodilators with slowly improving COPD exacerbation at this time mobilizing well, remains stable on 2 L nasal cannula, still having chest pain in the setting of COPD exacerbation workup was unremarkable with EKG serial troponin negative and echo unremarkable. Patient was monitored additional night on 11/29, on examination this morning she is doing much better, eager to go home today. She has oxygen tank portable at the bedside

## 2023-08-07 ENCOUNTER — Inpatient Hospital Stay (HOSPITAL_COMMUNITY): Payer: 59

## 2023-08-07 DIAGNOSIS — J441 Chronic obstructive pulmonary disease with (acute) exacerbation: Secondary | ICD-10-CM | POA: Diagnosis not present

## 2023-08-07 DIAGNOSIS — R079 Chest pain, unspecified: Secondary | ICD-10-CM | POA: Diagnosis not present

## 2023-08-07 LAB — CBC WITH DIFFERENTIAL/PLATELET
Abs Immature Granulocytes: 0.06 10*3/uL (ref 0.00–0.07)
Basophils Absolute: 0 10*3/uL (ref 0.0–0.1)
Basophils Relative: 0 %
Eosinophils Absolute: 0 10*3/uL (ref 0.0–0.5)
Eosinophils Relative: 0 %
HCT: 35.7 % — ABNORMAL LOW (ref 36.0–46.0)
Hemoglobin: 11.2 g/dL — ABNORMAL LOW (ref 12.0–15.0)
Immature Granulocytes: 0 %
Lymphocytes Relative: 24 %
Lymphs Abs: 3.2 10*3/uL (ref 0.7–4.0)
MCH: 29.9 pg (ref 26.0–34.0)
MCHC: 31.4 g/dL (ref 30.0–36.0)
MCV: 95.5 fL (ref 80.0–100.0)
Monocytes Absolute: 1 10*3/uL (ref 0.1–1.0)
Monocytes Relative: 7 %
Neutro Abs: 9.4 10*3/uL — ABNORMAL HIGH (ref 1.7–7.7)
Neutrophils Relative %: 69 %
Platelets: 230 10*3/uL (ref 150–400)
RBC: 3.74 MIL/uL — ABNORMAL LOW (ref 3.87–5.11)
RDW: 12.6 % (ref 11.5–15.5)
WBC: 13.7 10*3/uL — ABNORMAL HIGH (ref 4.0–10.5)
nRBC: 0 % (ref 0.0–0.2)

## 2023-08-07 LAB — ECHOCARDIOGRAM COMPLETE
Area-P 1/2: 3.72 cm2
Height: 63 in
S' Lateral: 2.4 cm
Weight: 1562.62 [oz_av]

## 2023-08-07 LAB — COMPREHENSIVE METABOLIC PANEL
ALT: 11 U/L (ref 0–44)
AST: 11 U/L — ABNORMAL LOW (ref 15–41)
Albumin: 3.2 g/dL — ABNORMAL LOW (ref 3.5–5.0)
Alkaline Phosphatase: 59 U/L (ref 38–126)
Anion gap: 8 (ref 5–15)
BUN: 23 mg/dL (ref 8–23)
CO2: 31 mmol/L (ref 22–32)
Calcium: 8.2 mg/dL — ABNORMAL LOW (ref 8.9–10.3)
Chloride: 96 mmol/L — ABNORMAL LOW (ref 98–111)
Creatinine, Ser: 0.64 mg/dL (ref 0.44–1.00)
GFR, Estimated: >60 mL/min (ref >60)
Glucose, Bld: 70 mg/dL (ref 70–99)
Potassium: 3.2 mmol/L — ABNORMAL LOW (ref 3.5–5.1)
Sodium: 135 mmol/L (ref 135–145)
Total Bilirubin: 0.3 mg/dL (ref <1.2)
Total Protein: 5.9 g/dL — ABNORMAL LOW (ref 6.5–8.1)

## 2023-08-07 MED ORDER — ORAL CARE MOUTH RINSE: 15.0000 mL | OROMUCOSAL | Status: DC | PRN

## 2023-08-07 MED ORDER — POTASSIUM CHLORIDE CRYS ER 20 MEQ PO TBCR
40.0000 meq | EXTENDED_RELEASE_TABLET | Freq: Once | ORAL | Status: AC
  Administered 2023-08-07: 40 meq via ORAL
  Filled 2023-08-07: qty 2

## 2023-08-07 NOTE — Progress Notes (Signed)
  Echocardiogram 2D Echocardiogram has been performed.  Delcie Roch 08/07/2023, 2:44 PM

## 2023-08-07 NOTE — Progress Notes (Signed)
PROGRESS NOTE [place]  VHQ:[id number] DOB: 18-Nov-1955 DOA: 08/03/2023 PCP: Jacquelin Hawking, PA-C  Brief Narrative/Hospital Course: [phone] w/ remote lung cancer status post + lobectomy 2018 adjunct chemo 2019-last follow-up-chronic COPD still smoker on Breztri albuterol follows with Dr. Cristi Loron pulmonology--apparently on 2 L chronic oxygen, Paroxysmal A-fib Eliquis-mild coronary artery disease on coronary CT 2019 calcium score 101 HTN HLD BMI 17, w/ recent admission:11/14-11/18 for COPD exacerbation presented to the ED with left-sided chest pain, cough In the ED tachypneic, EKG without ischemia troponins negative. CT chest>>no pulmonary embolism mild coronary artery calcification stable bronchial diffuse thickening and emphysema with no pulmonary embolus.  Patient was admitted for acute COPD exacerbation.  Subjective: Seen and examined, Overnight vitals/labs/events reviewed  some chest pain and shortness of breath overall feeling better Got winded with ambulation  Assessment and Plan: Principal Problem:   COPD exacerbation (HCC) Active Problems:   Chronic hypoxic respiratory failure (HCC)   Paroxysmal atrial fibrillation (HCC)   Hypothyroidism   History of lung cancer   Bipolar disorder (HCC)   Hyperlipidemia   Generalized anxiety disorder   History of depression   Left-sided chest pain  Acute exacerbation of COPD Chronic active smoker COPD stage C/D Remote lung cancer status post + lobectomy 2018 adjunct chemo 2019: Slow to improve.  Has shortness of breath with ambulation on 2 L nasal cannula.  CT angio chest reviewed on admission.   Cont on azithromycin, nebs tid, Solu-Medrol IV,Brovana/Pulmicort neb. CXR 11/29-no acute finding having some chest pain but serial troponins have been normal multiple times, continue pain management antitussives and treatment for COPD exacerbation.   Continue to counsel to smoking given patient's lung cancer history advised to follow-up  with outpatient oncology and pulmonary closely.  PAF: Rate controlled, continue Eliquis and Toprol.  Underlying chronic pleurisy with mild disease of coronary CT 2019: EKG nonischemic, BNP troponin flat> culture and outpatient workup like stress test continue pain management-continue underlying COPD treatment, check TTE.  Anxiety disorder: Continue Zoloft.  Hypothyroidism: on Synthroid.  HLD: Continue statin.  Body mass index is 17.3 kg/m.  Encourage to augment diet  DVT prophylaxis: Eliquis Code Status:   Code Status: Limited: Do not attempt resuscitation (DNR) -DNR-LIMITED -Do Not Intubate/DNI  Family Communication: plan of care discussed with patient/no family at bedside. Patient status is:  inpatient because of copd exacerbation Level of care: Med-Surg   Dispo: The patient is from: home, living with daughter currently            Anticipated disposition: Anticipate discharge home tomorrow.    Objective: Vitals last 24 hrs: Vitals:   08/06/23 1332 08/06/23 2009 08/06/23 2031 08/07/23 0500  BP:   104/63 110/76  Pulse:   64 76  Resp:   19 20  Temp:   98.1 F (36.7 C) 98.1 F (36.7 C)  TempSrc:   Oral Oral  SpO2: 94% 95% 100% 98%  Weight:      Height:       Weight change:   Physical Examination: General exam: alert awake, thin frail, not in distress  o nNC HEENT:Oral mucosa moist, Ear/Nose WNL grossly Respiratory system: Bilaterally diminished BS,no use of accessory muscle Cardiovascular system: S1 & S2 +, No JVD. Gastrointestinal system: Abdomen soft,NT,ND, BS+ Nervous System: Alert, awake, moving all extremities,and following commands. Extremities: LE edema neg,distal peripheral pulses palpable and warm.  Skin: No rashes,no icterus. MSK: Normal muscle bulk,tone, power   Medications reviewed:  Scheduled Meds:  apixaban  5 mg Oral  BID   arformoterol  15 mcg Nebulization BID   azithromycin  250 mg Oral Daily   benzonatate  100 mg Oral TID   budesonide  (PULMICORT) nebulizer solution  0.25 mg Nebulization BID   diclofenac  1 patch Transdermal BID   ipratropium  0.5 mg Nebulization TID   levalbuterol  0.63 mg Nebulization TID   levothyroxine  125 mcg Oral QAC breakfast   metoprolol succinate  12.5 mg Oral Daily   pantoprazole  40 mg Oral Daily   rosuvastatin  20 mg Oral Daily   sertraline  25 mg Oral q AM   sodium chloride flush  3 mL Intravenous Q12H   Continuous Infusions:   Diet Order             Diet Heart Room service appropriate? Yes; Fluid consistency: Thin  Diet effective now                   Intake/Output Summary (Last 24 hours) at 08/07/2023 0946 Last data filed at 08/07/2023 0600 Gross per 24 hour  Intake 240 ml  Output --  Net 240 ml   Net IO Since Admission: 840 mL [08/07/23 0946]  Wt Readings from Last 3 Encounters:  08/03/23 44.3 kg  07/24/23 44.5 kg  06/26/23 49.3 kg     Unresulted Labs (From admission, onward)     Start     Ordered   08/03/23 2202  MRSA Next Gen by PCR, Nasal  (COPD / Pneumonia / Cellulitis / Lower Extremity Wound)  Once,   R        08/03/23 2203          Data Reviewed: I have personally reviewed following labs and imaging studies CBC: Recent Labs  Lab 08/03/23 1723 08/04/23 0422 08/05/23 0450 08/06/23 0503 08/07/23 0525  WBC 11.8* 6.1 15.7* 14.1* 13.7*  NEUTROABS 7.4  --  11.9* 9.8* 9.4*  HGB 13.0 12.3 11.8* 11.7* 11.2*  HCT 40.7 40.4 37.7 36.6 35.7*  MCV 96.7 97.6 95.4 93.8 95.5  PLT 263 283 261 233 230   Basic Metabolic Panel:  Recent Labs  Lab 08/03/23 1723 08/04/23 0422 08/05/23 0450 08/06/23 0503 08/07/23 0525  NA 140 136 137 141 135  K 3.9 4.1 3.8 3.8 3.2*  CL 99 98 99 99 96*  CO2 33* 32 34* 33* 31  GLUCOSE 99 222* 103* 93 70  BUN 20 21 24* 24* 23  CREATININE 0.57 0.56 0.72 0.65 0.64  CALCIUM 9.0 8.8* 8.8* 9.0 8.2*   GFR: Estimated Creatinine Clearance: 49 mL/min (by C-G formula based on SCr of 0.64 mg/dL). Liver Function Tests:  Recent Labs   Lab 08/03/23 1723 08/04/23 0422 08/05/23 0450 08/06/23 0503 08/07/23 0525  AST 14* 16 12* 12* 11*  ALT 15 14 12 11 11   ALKPHOS 70 67 65 59 59  BILITOT 0.3 0.2 0.4 0.4 0.3  PROT 6.7 6.5 6.2* 6.2* 5.9*  ALBUMIN 3.6 3.6 3.3* 3.4* 3.2*  Sepsis Labs: No results for input(s): "PROCALCITON", "LATICACIDVEN" in the last 168 hours.  Recent Results (from the past 240 hour(s))  Resp panel by RT-PCR (RSV, Flu A&B, Covid) Anterior Nasal Swab     Status: None   Collection Time: 08/03/23 10:15 PM   Specimen: Anterior Nasal Swab  Result Value Ref Range Status   SARS Coronavirus 2 by RT PCR NEGATIVE NEGATIVE Final    Comment: (NOTE) SARS-CoV-2 target nucleic acids are NOT DETECTED.  The SARS-CoV-2 RNA is generally detectable in  upper respiratory specimens during the acute phase of infection. The lowest concentration of SARS-CoV-2 viral copies this assay can detect is 138 copies/mL. A negative result does not preclude SARS-Cov-2 infection and should not be used as the sole basis for treatment or other patient management decisions. A negative result may occur with  improper specimen collection/handling, submission of specimen other than nasopharyngeal swab, presence of viral mutation(s) within the areas targeted by this assay, and inadequate number of viral copies(<138 copies/mL). A negative result must be combined with clinical observations, patient history, and epidemiological information. The expected result is Negative.  Fact Sheet for Patients:  BloggerCourse.com  Fact Sheet for Healthcare Providers:  SeriousBroker.it  This test is no t yet approved or cleared by the [place] FDA and  has been authorized for detection and/or diagnosis of SARS-CoV-2 by FDA under an Emergency Use Authorization (EUA). This EUA will remain  in effect (meaning this test can be used) for the duration of the COVID-19 declaration under Section 564(b)(1)  of the Act, 21 U.S.C.section 360bbb-3(b)(1), unless the authorization is terminated  or revoked sooner.       Influenza A by PCR NEGATIVE NEGATIVE Final   Influenza B by PCR NEGATIVE NEGATIVE Final    Comment: (NOTE) The Xpert Xpress SARS-CoV-2/FLU/RSV plus assay is intended as an aid in the diagnosis of influenza from Nasopharyngeal swab specimens and should not be used as a sole basis for treatment. Nasal washings and aspirates are unacceptable for Xpert Xpress SARS-CoV-2/FLU/RSV testing.  Fact Sheet for Patients: BloggerCourse.com  Fact Sheet for Healthcare Providers: SeriousBroker.it  This test is not yet approved or cleared by the [place] FDA and has been authorized for detection and/or diagnosis of SARS-CoV-2 by FDA under an Emergency Use Authorization (EUA). This EUA will remain in effect (meaning this test can be used) for the duration of the COVID-19 declaration under Section 564(b)(1) of the Act, 21 U.S.C. section 360bbb-3(b)(1), unless the authorization is terminated or revoked.     Resp Syncytial Virus by PCR NEGATIVE NEGATIVE Final    Comment: (NOTE) Fact Sheet for Patients: BloggerCourse.com  Fact Sheet for Healthcare Providers: SeriousBroker.it  This test is not yet approved or cleared by the [place] FDA and has been authorized for detection and/or diagnosis of SARS-CoV-2 by FDA under an Emergency Use Authorization (EUA). This EUA will remain in effect (meaning this test can be used) for the duration of the COVID-19 declaration under Section 564(b)(1) of the Act, 21 U.S.C. section 360bbb-3(b)(1), unless the authorization is terminated or revoked.  Performed at [facility], [address], [place], [place] [address]     Antimicrobials: Anti-infectives (From admission, onward)    Start     Dose/Rate Route Frequency Ordered  Stop   08/06/23 1000  azithromycin (ZITHROMAX) tablet 250 mg       Placed in "Followed by" Linked Group   250 mg Oral Daily 08/05/23 1751 08/10/23 0959   08/05/23 1845  azithromycin (ZITHROMAX) tablet 500 mg       Placed in "Followed by" Linked Group   500 mg Oral Daily 08/05/23 1751 08/05/23 1847      Culture/Microbiology    Component Value Date/Time   SDES EXPECTORATED SPUTUM 06/20/2023 1228   SDES  06/20/2023 1228    EXPECTORATED SPUTUM Performed at [facility], [address] [place], [place] [address]    SPECREQUEST NONE 06/20/2023 1228   SPECREQUEST  06/20/2023 1228    NONE Reflexed from [id number]  Performed at [facility], [address] [place], [place] [address]    CULT FEW PSEUDOMONAS AERUGINOSA 06/20/2023 1228   REPTSTATUS 06/20/2023 FINAL 06/20/2023 1228   REPTSTATUS 06/25/2023 FINAL 06/20/2023 1228    Radiology Studies: DG Chest 2 View  Result Date: 08/06/2023 CLINICAL DATA:  Pneumonia EXAM: CHEST - 2 VIEW COMPARISON:  08/03/2023 FINDINGS: Heart size is normal. Postsurgical changes from prior left upper lobectomy with chronic scarring/fibrosis at the left lung apex. Fiducial marker the left hilum and within the left upper lobe. No new airspace consolidation. No pleural effusion or pneumothorax. IMPRESSION: Stable postsurgical changes to the left chest. No new airspace consolidation. Electronically Signed   By: Duanne Guess D.O.   On: 08/06/2023 14:14     LOS: 4 days   Total time spent in review of labs and imaging, patient evaluation, formulation of plan, documentation and communication with family: 35 minutes  Lanae Boast, MD [facility]  08/07/2023, 9:46 AM

## 2023-08-07 NOTE — Progress Notes (Signed)
Mobility Specialist - Progress Note   08/07/23 0947  Mobility  Activity Ambulated with assistance in hallway  Level of Assistance Contact guard assist, steadying assist  Assistive Device None  Distance Ambulated (ft) 300 ft  Range of Motion/Exercises Active  Activity Response Tolerated well  Mobility Referral Yes  $Mobility charge 1 Mobility  Mobility Specialist Start Time (ACUTE ONLY) 0915  Mobility Specialist Stop Time (ACUTE ONLY) 0925  Mobility Specialist Time Calculation (min) (ACUTE ONLY) 10 min   Received in bed and agreed to mobility, on 2L had no issues throughout session, returned to bed with all needs met.  Marilynne Halsted Mobility Specialist

## 2023-08-07 NOTE — Plan of Care (Signed)
Problem: Clinical Measurements: Goal: Respiratory complications will improve Outcome: Progressing

## 2023-08-08 DIAGNOSIS — J441 Chronic obstructive pulmonary disease with (acute) exacerbation: Secondary | ICD-10-CM | POA: Diagnosis not present

## 2023-08-08 MED ORDER — POTASSIUM CHLORIDE CRYS ER 20 MEQ PO TBCR
40.0000 meq | EXTENDED_RELEASE_TABLET | Freq: Once | ORAL | Status: AC
  Administered 2023-08-08: 40 meq via ORAL
  Filled 2023-08-08: qty 2

## 2023-08-08 MED ORDER — GUAIFENESIN ER 600 MG PO TB12
600.0000 mg | ORAL_TABLET | Freq: Two times a day (BID) | ORAL | Status: DC
  Administered 2023-08-08 – 2023-08-09 (×3): 600 mg via ORAL
  Filled 2023-08-08 (×3): qty 1

## 2023-08-08 NOTE — Plan of Care (Signed)
  Problem: Education: Goal: Knowledge of General Education information will improve Description: Including pain rating scale, medication(s)/side effects and non-pharmacologic comfort measures Outcome: Progressing   Problem: Health Behavior/Discharge Planning: Goal: Ability to manage health-related needs will improve Outcome: Progressing   Problem: Clinical Measurements: Goal: Diagnostic test results will improve Outcome: Progressing   Problem: Activity: Goal: Risk for activity intolerance will decrease Outcome: Progressing   Problem: Nutrition: Goal: Adequate nutrition will be maintained Outcome: Progressing   Problem: Coping: Goal: Level of anxiety will decrease Outcome: Progressing   Problem: Elimination: Goal: Will not experience complications related to bowel motility Outcome: Progressing Goal: Will not experience complications related to urinary retention Outcome: Progressing

## 2023-08-08 NOTE — Plan of Care (Signed)
  Problem: Clinical Measurements: Goal: Ability to maintain clinical measurements within normal limits will improve Outcome: Progressing Goal: Will remain free from infection Outcome: Progressing Goal: Diagnostic test results will improve Outcome: Progressing   Problem: Nutrition: Goal: Adequate nutrition will be maintained Outcome: Progressing   Problem: Respiratory: Goal: Levels of oxygenation will improve Outcome: Progressing Goal: Ability to maintain adequate ventilation will improve Outcome: Progressing   Problem: Education: Goal: Knowledge of General Education information will improve Description: Including pain rating scale, medication(s)/side effects and non-pharmacologic comfort measures Outcome: Adequate for Discharge   Problem: Health Behavior/Discharge Planning: Goal: Ability to manage health-related needs will improve Outcome: Adequate for Discharge   Problem: Clinical Measurements: Goal: Cardiovascular complication will be avoided Outcome: Adequate for Discharge   Problem: Coping: Goal: Level of anxiety will decrease Outcome: Adequate for Discharge   Problem: Elimination: Goal: Will not experience complications related to bowel motility Outcome: Adequate for Discharge Goal: Will not experience complications related to urinary retention Outcome: Adequate for Discharge   Problem: Pain Management: Goal: General experience of comfort will improve Outcome: Adequate for Discharge   Problem: Safety: Goal: Ability to remain free from injury will improve Outcome: Adequate for Discharge   Problem: Skin Integrity: Goal: Risk for impaired skin integrity will decrease Outcome: Adequate for Discharge   Problem: Education: Goal: Knowledge of disease or condition will improve Outcome: Adequate for Discharge Goal: Knowledge of the prescribed therapeutic regimen will improve Outcome: Adequate for Discharge   Problem: Activity: Goal: Ability to tolerate  increased activity will improve Outcome: Adequate for Discharge Goal: Will verbalize the importance of balancing activity with adequate rest periods Outcome: Adequate for Discharge   Problem: Respiratory: Goal: Ability to maintain a clear airway will improve Outcome: Adequate for Discharge

## 2023-08-08 NOTE — Progress Notes (Signed)
PROGRESS NOTE Tracy Park  ZHY:[id number] DOB: 1956/03/03 DOA: 08/03/2023 PCP: Jacquelin Hawking, PA-C  Brief Narrative/Hospital Course: [phone] w/ remote lung cancer status post + lobectomy 2018 adjunct chemo 2019-last follow-up-chronic COPD still smoker on Breztri albuterol follows with Dr. Cristi Loron pulmonology--apparently on 2 L chronic oxygen, Paroxysmal A-fib Eliquis-mild coronary artery disease on coronary CT 2019 calcium score 101 HTN HLD BMI 17, w/ recent admission:11/14-11/18 for COPD exacerbation presented to the ED with left-sided chest pain, cough In the ED tachypneic, EKG without ischemia troponins negative. CT chest>>no pulmonary embolism mild coronary artery calcification stable bronchial diffuse thickening and emphysema with no pulmonary embolus.  Patient was admitted for acute COPD exacerbation.  Subjective: Seen and examined, Overnight vitals/labs/events reviewed She reports she wants to stay 1 more day to make sure she is improved before discharge, worried about readmission Overall feeling better Assessment and Plan: Principal Problem:   COPD exacerbation (HCC) Active Problems:   Chronic hypoxic respiratory failure (HCC)   Paroxysmal atrial fibrillation (HCC)   Hypothyroidism   History of lung cancer   Bipolar disorder (HCC)   Hyperlipidemia   Generalized anxiety disorder   History of depression   Left-sided chest pain  Acute exacerbation of COPD Chronic active smoker COPD stage C/D Remote lung cancer status post + lobectomy 2018 adjunct chemo 2019: Slow to improve and appears clinically stable overall improving. Cont home oxygen setting 2 L.CT angio chest no PE. Cont on azithromycin, nebs tid, Solu-Medrol IV,Brovana/Pulmicort neb> transition to oral prednisone upon discharge. CXR 11/29-no acute finding having some chest pain but serial troponins have been normal multiple times, continue pain management antitussives and treatment for COPD exacerbation.    Continue to counsel to smoking given patient's lung cancer history advised to follow-up with outpatient oncology and pulmonary closely.  PAF: Rate controlled, continue Eliquis and Toprol.  Underlying chronic pleurisy with mild disease of coronary CT 2019: EKG nonischemic, BNP troponin flat> culture and outpatient workup like stress test continue pain management-continue underlying COPD treatment, checked TTE and unremarkable.  Anxiety disorder: Continue Zoloft.  Hypothyroidism: on Synthroid.  HLD: Continue statin.  Body mass index is 17.3 kg/m.  Encourage to augment diet  DVT prophylaxis: Eliquis Code Status:   Code Status: Limited: Do not attempt resuscitation (DNR) -DNR-LIMITED -Do Not Intubate/DNI  Family Communication: plan of care discussed with patient/no family at bedside. Patient status is:  inpatient because of copd exacerbation Level of care: Med-Surg   Dispo: The patient is from: home, living with daughter currently            Anticipated disposition: Anticipate discharge tomorrow if remains stable  Objective: Vitals last 24 hrs: Vitals:   08/07/23 1947 08/07/23 2007 08/08/23 0535 08/08/23 0809  BP:  (!) 100/59 117/80   Pulse:  67 64   Resp:  18 18   Temp:  98.5 F (36.9 C) 97.7 F (36.5 C)   TempSrc:  Oral Oral   SpO2: 98% 99% 100% 99%  Weight:      Height:       Weight change:   Physical Examination: General exam: alert awake, oriented at baseline, older than stated age HEENT:Oral mucosa moist, Ear/Nose WNL grossly Respiratory system: Bilaterally air entry present with expiratory wheezing,no use of accessory muscle Cardiovascular system: S1 & S2 +, No JVD. Gastrointestinal system: Abdomen soft,NT,ND, BS+ Nervous System: Alert, awake, moving all extremities,and following commands. Extremities: LE edema neg,distal peripheral pulses palpable and warm.  Skin: No rashes,no icterus. MSK: Normal muscle bulk,tone,  power   Medications reviewed:   Scheduled Meds:  apixaban  5 mg Oral BID   arformoterol  15 mcg Nebulization BID   azithromycin  250 mg Oral Daily   benzonatate  100 mg Oral TID   budesonide (PULMICORT) nebulizer solution  0.25 mg Nebulization BID   diclofenac  1 patch Transdermal BID   ipratropium  0.5 mg Nebulization TID   levalbuterol  0.63 mg Nebulization TID   levothyroxine  125 mcg Oral QAC breakfast   metoprolol succinate  12.5 mg Oral Daily   pantoprazole  40 mg Oral Daily   rosuvastatin  20 mg Oral Daily   sertraline  25 mg Oral q AM   sodium chloride flush  3 mL Intravenous Q12H  Continuous Infusions:  Diet Order             Diet Heart Room service appropriate? Yes; Fluid consistency: Thin  Diet effective now                  Intake/Output Summary (Last 24 hours) at 08/08/2023 1040 Last data filed at 08/08/2023 0926 Gross per 24 hour  Intake 720 ml  Output --  Net 720 ml   Net IO Since Admission: 1,800 mL [08/08/23 1040]  Wt Readings from Last 3 Encounters:  08/03/23 44.3 kg  07/24/23 44.5 kg  06/26/23 49.3 kg    Unresulted Labs (From admission, onward)     Start     Ordered   08/03/23 2202  MRSA Next Gen by PCR, Nasal  (COPD / Pneumonia / Cellulitis / Lower Extremity Wound)  Once,   R        08/03/23 2203          Data Reviewed: I have personally reviewed following labs and imaging studies CBC: Recent Labs  Lab 08/03/23 1723 08/04/23 0422 08/05/23 0450 08/06/23 0503 08/07/23 0525  WBC 11.8* 6.1 15.7* 14.1* 13.7*  NEUTROABS 7.4  --  11.9* 9.8* 9.4*  HGB 13.0 12.3 11.8* 11.7* 11.2*  HCT 40.7 40.4 37.7 36.6 35.7*  MCV 96.7 97.6 95.4 93.8 95.5  PLT 263 283 261 233 230   Basic Metabolic Panel:  Recent Labs  Lab 08/03/23 1723 08/04/23 0422 08/05/23 0450 08/06/23 0503 08/07/23 0525  NA 140 136 137 141 135  K 3.9 4.1 3.8 3.8 3.2*  CL 99 98 99 99 96*  CO2 33* 32 34* 33* 31  GLUCOSE 99 222* 103* 93 70  BUN 20 21 24* 24* 23  CREATININE 0.57 0.56 0.72 0.65 0.64   CALCIUM 9.0 8.8* 8.8* 9.0 8.2*   GFR: Estimated Creatinine Clearance: 49 mL/min (by C-G formula based on SCr of 0.64 mg/dL). Liver Function Tests:  Recent Labs  Lab 08/03/23 1723 08/04/23 0422 08/05/23 0450 08/06/23 0503 08/07/23 0525  AST 14* 16 12* 12* 11*  ALT 15 14 12 11 11   ALKPHOS 70 67 65 59 59  BILITOT 0.3 0.2 0.4 0.4 0.3  PROT 6.7 6.5 6.2* 6.2* 5.9*  ALBUMIN 3.6 3.6 3.3* 3.4* 3.2*  Sepsis Labs: No results for input(s): "PROCALCITON", "LATICACIDVEN" in the last 168 hours.  Recent Results (from the past 240 hour(s))  Resp panel by RT-PCR (RSV, Flu A&B, Covid) Anterior Nasal Swab     Status: None   Collection Time: 08/03/23 10:15 PM   Specimen: Anterior Nasal Swab  Result Value Ref Range Status   SARS Coronavirus 2 by RT PCR NEGATIVE NEGATIVE Final    Comment: (NOTE) SARS-CoV-2 target nucleic acids are  NOT DETECTED.  The SARS-CoV-2 RNA is generally detectable in upper respiratory specimens during the acute phase of infection. The lowest concentration of SARS-CoV-2 viral copies this assay can detect is 138 copies/mL. A negative result does not preclude SARS-Cov-2 infection and should not be used as the sole basis for treatment or other patient management decisions. A negative result may occur with  improper specimen collection/handling, submission of specimen other than nasopharyngeal swab, presence of viral mutation(s) within the areas targeted by this assay, and inadequate number of viral copies(<138 copies/mL). A negative result must be combined with clinical observations, patient history, and epidemiological information. The expected result is Negative.  Fact Sheet for Patients:  BloggerCourse.com  Fact Sheet for Healthcare Providers:  SeriousBroker.it  This test is no t yet approved or cleared by the [place] FDA and  has been authorized for detection and/or diagnosis of SARS-CoV-2 by FDA under an  Emergency Use Authorization (EUA). This EUA will remain  in effect (meaning this test can be used) for the duration of the COVID-19 declaration under Section 564(b)(1) of the Act, 21 U.S.C.section 360bbb-3(b)(1), unless the authorization is terminated  or revoked sooner.       Influenza A by PCR NEGATIVE NEGATIVE Final   Influenza B by PCR NEGATIVE NEGATIVE Final    Comment: (NOTE) The Xpert Xpress SARS-CoV-2/FLU/RSV plus assay is intended as an aid in the diagnosis of influenza from Nasopharyngeal swab specimens and should not be used as a sole basis for treatment. Nasal washings and aspirates are unacceptable for Xpert Xpress SARS-CoV-2/FLU/RSV testing.  Fact Sheet for Patients: BloggerCourse.com  Fact Sheet for Healthcare Providers: SeriousBroker.it  This test is not yet approved or cleared by the [place] FDA and has been authorized for detection and/or diagnosis of SARS-CoV-2 by FDA under an Emergency Use Authorization (EUA). This EUA will remain in effect (meaning this test can be used) for the duration of the COVID-19 declaration under Section 564(b)(1) of the Act, 21 U.S.C. section 360bbb-3(b)(1), unless the authorization is terminated or revoked.     Resp Syncytial Virus by PCR NEGATIVE NEGATIVE Final    Comment: (NOTE) Fact Sheet for Patients: BloggerCourse.com  Fact Sheet for Healthcare Providers: SeriousBroker.it  This test is not yet approved or cleared by the [place] FDA and has been authorized for detection and/or diagnosis of SARS-CoV-2 by FDA under an Emergency Use Authorization (EUA). This EUA will remain in effect (meaning this test can be used) for the duration of the COVID-19 declaration under Section 564(b)(1) of the Act, 21 U.S.C. section 360bbb-3(b)(1), unless the authorization is terminated or revoked.  Performed at [facility], [address], [place], [place] [address]     Antimicrobials: Anti-infectives (From admission, onward)    Start     Dose/Rate Route Frequency Ordered Stop   08/06/23 1000  azithromycin (ZITHROMAX) tablet 250 mg       Placed in "Followed by" Linked Group   250 mg Oral Daily 08/05/23 1751 08/10/23 0959   08/05/23 1845  azithromycin (ZITHROMAX) tablet 500 mg       Placed in "Followed by" Linked Group   500 mg Oral Daily 08/05/23 1751 08/05/23 1847      Culture/Microbiology    Component Value Date/Time   SDES EXPECTORATED SPUTUM 06/20/2023 1228   SDES  06/20/2023 1228    EXPECTORATED SPUTUM Performed at [facility], [address] [place], [place] [address]    SPECREQUEST NONE 06/20/2023 1228   SPECREQUEST  06/20/2023 1228    NONE Reflexed from [id number] Performed at [facility], [address], [place], [place] [address]    CULT FEW PSEUDOMONAS AERUGINOSA 06/20/2023 1228   REPTSTATUS 06/20/2023 FINAL 06/20/2023 1228   REPTSTATUS 06/25/2023 FINAL 06/20/2023 1228    Radiology Studies: ECHOCARDIOGRAM COMPLETE  Result Date: 08/07/2023    ECHOCARDIOGRAM REPORT   Patient Name:   Tracy Park Date of Exam: 08/07/2023 Medical Rec #:  [id number]        Height:       63.0 in Accession #:    [id number]       Weight:       97.7 lb Date of Birth:  [birth year]        BSA:          1.426 m Patient Age:    67 years         BP:           112/70 mmHg Patient Gender: F                HR:           76 bpm. Exam Location:  Inpatient Procedure: 2D Echo, Cardiac Doppler and Color Doppler Indications:    chest pain  History:        Patient has prior history of Echocardiogram examinations, most                 recent 01/26/2023. COPD; Risk Factors:Dyslipidemia and Current                 Smoker.  Sonographer:    Delcie Roch RDCS Referring Phys: [id number] Tyeasha Ebbs  Sonographer Comments: Image acquisition challenging due to respiratory motion.  IMPRESSIONS  1. Left ventricular ejection fraction, by estimation, is 60 to 65%. The left ventricle has normal function. The left ventricle has no regional wall motion abnormalities. Left ventricular diastolic parameters are consistent with Grade I diastolic dysfunction (impaired relaxation).  2. Right ventricular systolic function is normal. The right ventricular size is normal. There is mildly elevated pulmonary artery systolic pressure. The estimated right ventricular systolic pressure is 40.7 mmHg.  3. The mitral valve is normal in structure. No evidence of mitral valve regurgitation. No evidence of mitral stenosis.  4. The aortic valve is normal in structure. There is mild thickening of the aortic valve. Aortic valve regurgitation is not visualized. No aortic stenosis is present.  5. The inferior vena cava is normal in size with greater than 50% respiratory variability, suggesting right atrial pressure of 3 mmHg. FINDINGS  Left Ventricle: Left ventricular ejection fraction, by estimation, is 60 to 65%. The left ventricle has normal function. The left ventricle has no regional wall motion abnormalities. The left ventricular internal cavity size was normal in size. There is  no left ventricular hypertrophy. Left ventricular diastolic parameters are consistent with Grade I diastolic dysfunction (impaired relaxation). Right Ventricle: The right ventricular size is normal. No increase in right ventricular wall thickness. Right ventricular systolic function is normal. There is mildly elevated pulmonary artery systolic pressure. The tricuspid regurgitant velocity is 3.07  m/s, and with an assumed right atrial pressure of 3 mmHg, the estimated right ventricular systolic pressure is 40.7 mmHg. Left Atrium: Left atrial size was normal in size. Right Atrium: Right atrial size was normal in size. Pericardium: There is no evidence of pericardial effusion. Mitral Valve: The mitral valve is normal in structure. There is mild  thickening of the mitral valve  leaflet(s). No evidence of mitral valve regurgitation. No evidence of mitral valve stenosis. Tricuspid Valve: The tricuspid valve is normal in structure. Tricuspid valve regurgitation is trivial. No evidence of tricuspid stenosis. Aortic Valve: The aortic valve is normal in structure. There is mild thickening of the aortic valve. Aortic valve regurgitation is not visualized. No aortic stenosis is present. Pulmonic Valve: The pulmonic valve was normal in structure. Pulmonic valve regurgitation is not visualized. No evidence of pulmonic stenosis. Aorta: The aortic root is normal in size and structure. Venous: The inferior vena cava is normal in size with greater than 50% respiratory variability, suggesting right atrial pressure of 3 mmHg. IAS/Shunts: No atrial level shunt detected by color flow Doppler.  LEFT VENTRICLE PLAX 2D LVIDd:         3.80 cm   Diastology LVIDs:         2.40 cm   LV e' medial:    7.29 cm/s LV PW:         1.00 cm   LV E/e' medial:  14.4 LV IVS:        0.90 cm   LV e' lateral:   10.20 cm/s LVOT diam:     1.80 cm   LV E/e' lateral: 10.3 LV SV:         70 LV SV Index:   49 LVOT Area:     2.54 cm  RIGHT VENTRICLE             IVC RV Basal diam:  2.10 cm     IVC diam: 1.20 cm RV S prime:     13.60 cm/s TAPSE (M-mode): 1.9 cm LEFT ATRIUM             Index        RIGHT ATRIUM          Index LA diam:        2.80 cm 1.96 cm/m   RA Area:     9.79 cm LA Vol (A2C):   39.3 ml 27.56 ml/m  RA Volume:   21.20 ml 14.87 ml/m LA Vol (A4C):   32.0 ml 22.44 ml/m LA Biplane Vol: 35.8 ml 25.11 ml/m  AORTIC VALVE LVOT Vmax:   137.00 cm/s LVOT Vmean:  82.700 cm/s LVOT VTI:    0.276 m  AORTA Ao Root diam: 2.90 cm Ao Asc diam:  2.80 cm MITRAL VALVE                TRICUSPID VALVE MV Area (PHT): 3.72 cm     TR Peak grad:   37.7 mmHg MV Decel Time: 204 msec     TR Vmax:        307.00 cm/s MV E velocity: 105.00 cm/s MV A velocity: 117.00 cm/s  SHUNTS MV E/A ratio:  0.90         Systemic  VTI:  0.28 m                             Systemic Diam: 1.80 cm Aditya Sabharwal Electronically signed by Dorthula Nettles Signature Date/Time: 08/07/2023/2:56:56 PM    Final     LOS: 5 days Total time spent in review of labs and imaging, patient evaluation, formulation of plan, documentation and communication with family: 35 minutes  Lanae Boast, MD [facility]  08/08/2023, 10:40 AM

## 2023-08-08 NOTE — Progress Notes (Signed)
Mobility Specialist - Progress Note  Pre-mobility: 70 bpm HR, 99% SpO2 During mobility: 88 bpm HR, 95% SpO2 Post-mobility: 85 bpm HR, 94% SPO2   08/08/23 1121  Mobility  Activity Ambulated independently in hallway  Level of Assistance Independent  Assistive Device None  Distance Ambulated (ft) 160 ft  Range of Motion/Exercises Active  Activity Response Tolerated well  Mobility Referral Yes  $Mobility charge 1 Mobility  Mobility Specialist Start Time (ACUTE ONLY) 1110  Mobility Specialist Stop Time (ACUTE ONLY) 1121  Mobility Specialist Time Calculation (min) (ACUTE ONLY) 11 min   Pt was found in bed and agreeable to ambulate. No complaints with session. At EOS returned to bed with all need met. Call bell in reach and RN in room.  Billey Chang Mobility Specialist

## 2023-08-09 DIAGNOSIS — J441 Chronic obstructive pulmonary disease with (acute) exacerbation: Secondary | ICD-10-CM | POA: Diagnosis not present

## 2023-08-09 MED ORDER — PANTOPRAZOLE SODIUM 40 MG PO TBEC: 40.0000 mg | DELAYED_RELEASE_TABLET | Freq: Every day | ORAL | 0 refills | Status: DC

## 2023-08-09 MED ORDER — ALBUTEROL SULFATE HFA 108 (90 BASE) MCG/ACT IN AERS: 2.0000 | INHALATION_SPRAY | RESPIRATORY_TRACT | 0 refills | Status: DC | PRN

## 2023-08-09 MED ORDER — PREDNISONE 10 MG PO TABS: ORAL_TABLET | ORAL | 0 refills | Status: DC

## 2023-08-09 NOTE — TOC Transition Note (Signed)
Transition of Care [facility]) - CM/SW Discharge Note   Patient Details  Name: Tracy Park MRN: [id number] Date of Birth: Jul 30, 1956  Transition of Care [facility]) CM/SW Contact:  Adrian Prows, RN Phone Number: 08/09/2023, 12:24 PM   Clinical Narrative:    TOC notified pt needs transportation home; spoke w/ pt and dtr [facility] over phone; Maxine Glenn says she is at work and cannot transport pt; d/c address verified [address] [place], [place] [address]; PTAR called at 65; spoke w/ operator # [phone]; no TOC needs.    Final next level of care: Home/Self Care Barriers to Discharge: No Barriers Identified   Patient Goals and CMS Choice CMS Medicare.gov Compare Post Acute Care list provided to:: Patient Represenative (must comment) Choice offered to / list presented to : Adult Children  Discharge Placement                         Discharge Plan and Services Additional resources added to the After Visit Summary for     Discharge Planning Services: CM Consult                                 Social Determinants of Health (SDOH) Interventions SDOH Screenings   Food Insecurity: No Food Insecurity (08/04/2023)  Recent Concern: Food Insecurity - Food Insecurity Present (06/20/2023)  Housing: Patient Declined (08/04/2023)  Transportation Needs: No Transportation Needs (08/04/2023)  Utilities: Not At Risk (08/04/2023)  Financial Resource Strain: Medium Risk (04/07/2023)   Received from [facility]  Physical Activity: Unknown (04/07/2023)   Received from [facility]  Social Connections: Moderately Integrated (04/07/2023)   Received from [facility]  Stress: Stress Concern Present (04/07/2023)   Received from [facility]  Tobacco Use: High Risk (08/04/2023)     Readmission Risk Interventions    08/04/2023    3:04 PM 07/27/2023   12:06 PM 04/29/2023    2:02 PM  Readmission Risk Prevention Plan  Transportation Screening Complete Complete Complete  Medication  Review Oceanographer) Complete Complete Complete  PCP or Specialist appointment within 3-5 days of discharge Complete  Complete  HRI or Home Care Consult Complete Complete Complete  SW Recovery Care/Counseling Consult Complete Complete Complete  Palliative Care Screening Not Applicable Not Applicable Not Applicable  Skilled Nursing Facility Not Applicable Not Applicable Not Applicable

## 2023-08-09 NOTE — Discharge Summary (Signed)
Physician Discharge Summary  SONGA HEMMING UEA:[id number] DOB: [birth year] DOA: 08/03/2023  PCP: Jacquelin Hawking, PA-C  Admit date: 08/03/2023 Discharge date: 08/09/2023 Recommendations for Outpatient Follow-up:  Follow up with PCP in 1 weeks-call for appointment Please obtain BMP/CBC in one week  Discharge Dispo: Home Discharge Condition: Stable Code Status:   Code Status: Limited: Do not attempt resuscitation (DNR) -DNR-LIMITED -Do Not Intubate/DNI  Diet recommendation:  Diet Order             Diet Heart Room service appropriate? Yes; Fluid consistency: Thin  Diet effective now                    Brief/Interim Summary: 67yof w/ remote lung cancer status post + lobectomy 2018 adjunct chemo 2019-last follow-up-chronic COPD still smoker on Breztri albuterol follows with Dr. Cristi Loron pulmonology--apparently on 2 L chronic oxygen, Paroxysmal A-fib Eliquis-mild coronary artery disease on coronary CT 2019 calcium score 101 HTN HLD BMI 17, w/ recent admission:11/14-11/18 for COPD exacerbation presented to the ED with left-sided chest pain, cough In the ED tachypneic, EKG without ischemia troponins negative. CT chest>>no pulmonary embolism mild coronary artery calcification stable bronchial diffuse thickening and emphysema with no pulmonary embolus.  Patient was admitted for acute COPD exacerbation.  Patient was treated with IV steroids bronchodilators with slowly improving COPD exacerbation at this time mobilizing well, remains stable on 2 L nasal cannula, still having chest pain in the setting of COPD exacerbation workup was unremarkable with EKG serial troponin negative and echo unremarkable. Patient was monitored additional night on 11/29, on examination this morning she is doing much better, eager to go home today. She has oxygen tank portable at the bedside     Discharge Diagnoses:  Principal Problem:   COPD exacerbation (HCC) Active Problems:   Chronic hypoxic respiratory  failure (HCC)   Paroxysmal atrial fibrillation (HCC)   Hypothyroidism   History of lung cancer   Bipolar disorder (HCC)   Hyperlipidemia   Generalized anxiety disorder   History of depression   Left-sided chest pain  Acute exacerbation of COPD Chronic active smoker COPD stage C/D Remote lung cancer status post + lobectomy 2018 adjunct chemo 2019: Slow to improve and appears clinically stable overall improving. Cont home oxygen setting 2 L.CT angio chest no PE. Cont on azithromycin, nebs tid, Solu-Medrol IV,Brovana/Pulmicort neb> transition to oral prednisone upon discharge. CXR 11/29-no acute finding having some chest pain but serial troponins have been normal multiple times, continue pain management antitussives and treatment for COPD exacerbation.   She is clinically improved we will discharge her on oral prednisone taper, completed antibiotics.  Give refills for albuterol inhaler   PAF: Rate controlled, continue Eliquis and Toprol.  Underlying chronic pleurisy with mild disease of coronary CT 2019: EKG nonischemic, BNP troponin flat> culture and outpatient workup like stress test continue pain management-continue underlying COPD treatment, checked TTE and unremarkable.  No pain today  Anxiety disorder: Continue Zoloft.  Hypothyroidism: on Synthroid.  HLD: Continue statin.  Consults: None Subjective: Alert awake oriented, doing well on home oxygen setting Feels ready for discharge home today  Discharge Exam: Vitals:   08/09/23 0502 08/09/23 0738  BP: 106/65   Pulse: (!) 57   Resp: 16   Temp: 97.9 F (36.6 C)   SpO2: 100% 95%   General: Pt is alert, awake, not in acute distress Cardiovascular: RRR, S1/S2 +, no rubs, no gallops Respiratory: CTA bilaterally, no wheezing, no rhonchi Abdominal: Soft, NT, ND, bowel  sounds + Extremities: no edema, no cyanosis  Discharge Instructions  Discharge Instructions     Discharge instructions   Complete by: As directed     Please call call MD or return to ER for similar or worsening recurring problem that brought you to hospital or if any fever,nausea/vomiting,abdominal pain, uncontrolled pain, chest pain,  shortness of breath or any other alarming symptoms.  Please follow-up your doctor as instructed in a week time and call the office for appointment.  Please avoid alcohol, smoking, or any other illicit substance and maintain healthy habits including taking your regular medications as prescribed.  You were cared for by a hospitalist during your hospital stay. If you have any questions about your discharge medications or the care you received while you were in the hospital after you are discharged, you can call the unit and ask to speak with the hospitalist on call if the hospitalist that took care of you is not available.  Once you are discharged, your primary care physician will handle any further medical issues. Please note that NO REFILLS for any discharge medications will be authorized once you are discharged, as it is imperative that you return to your primary care physician (or establish a relationship with a primary care physician if you do not have one) for your aftercare needs so that they can reassess your need for medications and monitor your lab values   Increase activity slowly   Complete by: As directed       Allergies as of 08/09/2023       Reactions   Red Dye #40 (allura Red) Hives, Itching, Other (See Comments)   Red food dye   Strawberry Extract Hives, Itching   Tomato Hives, Itching   Aspirin Hives   Tape Rash, Other (See Comments)   Prefers paper tape   Wound Dressing Adhesive Rash        Medication List     STOP taking these medications    oxyCODONE 5 MG immediate release tablet Commonly known as: Roxicodone       TAKE these medications    albuterol (2.5 MG/3ML) 0.083% nebulizer solution Commonly known as: PROVENTIL Take 3 mLs (2.5 mg total) by nebulization every 4  (four) hours while awake for 3 days, THEN 3 mLs (2.5 mg total) every 4 (four) hours as needed for wheezing or shortness of breath. Start taking on: June 26, 2023   albuterol 108 (90 Base) MCG/ACT inhaler Commonly known as: VENTOLIN HFA Inhale 2 puffs into the lungs every 4 (four) hours as needed for wheezing or shortness of breath.   benzonatate 100 MG capsule Commonly known as: Tessalon Perles Take 1 capsule (100 mg total) by mouth every 6 (six) hours as needed for cough.   Breztri Aerosphere 160-9-4.8 MCG/ACT Aero Generic drug: Budeson-Glycopyrrol-Formoterol Inhale 1 puff into the lungs in the morning and at bedtime.   Eliquis 5 MG Tabs tablet Generic drug: apixaban Take 1 tablet (5 mg total) by mouth 2 (two) times daily.   levothyroxine 125 MCG tablet Commonly known as: SYNTHROID Take 125 mcg by mouth daily before breakfast.   magnesium oxide 400 (240 Mg) MG tablet Commonly known as: MAG-OX Take 400 mg by mouth in the morning.   metoprolol succinate 25 MG 24 hr tablet Commonly known as: TOPROL-XL Take 0.5 tablets (12.5 mg total) by mouth daily. What changed: Another medication with the same name was removed. Continue taking this medication, and follow the directions you see here.   nitroGLYCERIN  0.4 MG SL tablet Commonly known as: NITROSTAT Place 0.4 mg under the tongue every 5 (five) minutes as needed for chest pain.   OXYGEN Inhale 2 L/min into the lungs continuous.   pantoprazole 40 MG tablet Commonly known as: PROTONIX Take 1 tablet (40 mg total) by mouth daily for 14 days.   predniSONE 10 MG tablet Commonly known as: DELTASONE Take PO 4 tabs daily x 3 days,3 tabs daily x 3 days,2 tabs daily x 3 days,1 tab daily x 2 days then stop. What changed:  medication strength how much to take how to take this when to take this additional instructions   rosuvastatin 20 MG tablet Commonly known as: CRESTOR Take 20 mg by mouth daily.   sertraline 25 MG  tablet Commonly known as: ZOLOFT Take 25 mg by mouth in the morning.   Systane Complete PF 0.6 % Soln Generic drug: Propylene Glycol (PF) Place 1 drop into both eyes 3 (three) times daily as needed (for dryness).   Tylenol 8 Hour 650 MG CR tablet Generic drug: acetaminophen Take 650-1,300 mg by mouth 2 (two) times daily as needed for pain.        Follow-up Information     Jacquelin Hawking, PA-C Follow up in 1 week(s).   Specialty: Physician Assistant Contact information: [address] [place] [address] [phone]                Allergies  Allergen Reactions   Red Dye #40 (Allura Red) Hives, Itching and Other (See Comments)    Red food dye   Strawberry Extract Hives and Itching   Tomato Hives and Itching   Aspirin Hives   Tape Rash and Other (See Comments)    Prefers paper tape   Wound Dressing Adhesive Rash    The results of significant diagnostics from this hospitalization (including imaging, microbiology, ancillary and laboratory) are listed below for reference.    Microbiology: Recent Results (from the past 240 hour(s))  Resp panel by RT-PCR (RSV, Flu A&B, Covid) Anterior Nasal Swab     Status: None   Collection Time: 08/03/23 10:15 PM   Specimen: Anterior Nasal Swab  Result Value Ref Range Status   SARS Coronavirus 2 by RT PCR NEGATIVE NEGATIVE Final    Comment: (NOTE) SARS-CoV-2 target nucleic acids are NOT DETECTED.  The SARS-CoV-2 RNA is generally detectable in upper respiratory specimens during the acute phase of infection. The lowest concentration of SARS-CoV-2 viral copies this assay can detect is 138 copies/mL. A negative result does not preclude SARS-Cov-2 infection and should not be used as the sole basis for treatment or other patient management decisions. A negative result may occur with  improper specimen collection/handling, submission of specimen other than nasopharyngeal swab, presence of viral mutation(s) within  the areas targeted by this assay, and inadequate number of viral copies(<138 copies/mL). A negative result must be combined with clinical observations, patient history, and epidemiological information. The expected result is Negative.  Fact Sheet for Patients:  BloggerCourse.com  Fact Sheet for Healthcare Providers:  SeriousBroker.it  This test is no t yet approved or cleared by the [place] FDA and  has been authorized for detection and/or diagnosis of SARS-CoV-2 by FDA under an Emergency Use Authorization (EUA). This EUA will remain  in effect (meaning this test can be used) for the duration of the COVID-19 declaration under Section 564(b)(1) of the Act, 21 U.S.C.section 360bbb-3(b)(1), unless the authorization is terminated  or revoked sooner.  Influenza A by PCR NEGATIVE NEGATIVE Final   Influenza B by PCR NEGATIVE NEGATIVE Final    Comment: (NOTE) The Xpert Xpress SARS-CoV-2/FLU/RSV plus assay is intended as an aid in the diagnosis of influenza from Nasopharyngeal swab specimens and should not be used as a sole basis for treatment. Nasal washings and aspirates are unacceptable for Xpert Xpress SARS-CoV-2/FLU/RSV testing.  Fact Sheet for Patients: BloggerCourse.com  Fact Sheet for Healthcare Providers: SeriousBroker.it  This test is not yet approved or cleared by the [place] FDA and has been authorized for detection and/or diagnosis of SARS-CoV-2 by FDA under an Emergency Use Authorization (EUA). This EUA will remain in effect (meaning this test can be used) for the duration of the COVID-19 declaration under Section 564(b)(1) of the Act, 21 U.S.C. section 360bbb-3(b)(1), unless the authorization is terminated or revoked.     Resp Syncytial Virus by PCR NEGATIVE NEGATIVE Final    Comment: (NOTE) Fact Sheet for  Patients: BloggerCourse.com  Fact Sheet for Healthcare Providers: SeriousBroker.it  This test is not yet approved or cleared by the [place] FDA and has been authorized for detection and/or diagnosis of SARS-CoV-2 by FDA under an Emergency Use Authorization (EUA). This EUA will remain in effect (meaning this test can be used) for the duration of the COVID-19 declaration under Section 564(b)(1) of the Act, 21 U.S.C. section 360bbb-3(b)(1), unless the authorization is terminated or revoked.  Performed at [facility], [address], [place], [place] [address]     Procedures/Studies: ECHOCARDIOGRAM COMPLETE  Result Date: 08/07/2023    ECHOCARDIOGRAM REPORT   Patient Name:   DALEYZAH SCHONER Date of Exam: 08/07/2023 Medical Rec #:  [id number]        Height:       63.0 in Accession #:    [id number]       Weight:       97.7 lb Date of Birth:  [birth year]        BSA:          1.426 m Patient Age:    67 years         BP:           112/70 mmHg Patient Gender: F                HR:           76 bpm. Exam Location:  Inpatient Procedure: 2D Echo, Cardiac Doppler and Color Doppler Indications:    chest pain  History:        Patient has prior history of Echocardiogram examinations, most                 recent 01/26/2023. COPD; Risk Factors:Dyslipidemia and Current                 Smoker.  Sonographer:    Delcie Roch RDCS Referring Phys: [id number] Osbaldo Mark  Sonographer Comments: Image acquisition challenging due to respiratory motion. IMPRESSIONS  1. Left ventricular ejection fraction, by estimation, is 60 to 65%. The left ventricle has normal function. The left ventricle has no regional wall motion abnormalities. Left ventricular diastolic parameters are consistent with Grade I diastolic dysfunction (impaired relaxation).  2. Right ventricular systolic function is normal. The right ventricular size is normal. There is mildly elevated  pulmonary artery systolic pressure. The estimated right ventricular systolic pressure is 40.7 mmHg.  3. The mitral valve is normal in structure. No evidence of mitral valve regurgitation. No evidence of mitral  stenosis.  4. The aortic valve is normal in structure. There is mild thickening of the aortic valve. Aortic valve regurgitation is not visualized. No aortic stenosis is present.  5. The inferior vena cava is normal in size with greater than 50% respiratory variability, suggesting right atrial pressure of 3 mmHg. FINDINGS  Left Ventricle: Left ventricular ejection fraction, by estimation, is 60 to 65%. The left ventricle has normal function. The left ventricle has no regional wall motion abnormalities. The left ventricular internal cavity size was normal in size. There is  no left ventricular hypertrophy. Left ventricular diastolic parameters are consistent with Grade I diastolic dysfunction (impaired relaxation). Right Ventricle: The right ventricular size is normal. No increase in right ventricular wall thickness. Right ventricular systolic function is normal. There is mildly elevated pulmonary artery systolic pressure. The tricuspid regurgitant velocity is 3.07  m/s, and with an assumed right atrial pressure of 3 mmHg, the estimated right ventricular systolic pressure is 40.7 mmHg. Left Atrium: Left atrial size was normal in size. Right Atrium: Right atrial size was normal in size. Pericardium: There is no evidence of pericardial effusion. Mitral Valve: The mitral valve is normal in structure. There is mild thickening of the mitral valve leaflet(s). No evidence of mitral valve regurgitation. No evidence of mitral valve stenosis. Tricuspid Valve: The tricuspid valve is normal in structure. Tricuspid valve regurgitation is trivial. No evidence of tricuspid stenosis. Aortic Valve: The aortic valve is normal in structure. There is mild thickening of the aortic valve. Aortic valve regurgitation is not visualized.  No aortic stenosis is present. Pulmonic Valve: The pulmonic valve was normal in structure. Pulmonic valve regurgitation is not visualized. No evidence of pulmonic stenosis. Aorta: The aortic root is normal in size and structure. Venous: The inferior vena cava is normal in size with greater than 50% respiratory variability, suggesting right atrial pressure of 3 mmHg. IAS/Shunts: No atrial level shunt detected by color flow Doppler.  LEFT VENTRICLE PLAX 2D LVIDd:         3.80 cm   Diastology LVIDs:         2.40 cm   LV e' medial:    7.29 cm/s LV PW:         1.00 cm   LV E/e' medial:  14.4 LV IVS:        0.90 cm   LV e' lateral:   10.20 cm/s LVOT diam:     1.80 cm   LV E/e' lateral: 10.3 LV SV:         70 LV SV Index:   49 LVOT Area:     2.54 cm  RIGHT VENTRICLE             IVC RV Basal diam:  2.10 cm     IVC diam: 1.20 cm RV S prime:     13.60 cm/s TAPSE (M-mode): 1.9 cm LEFT ATRIUM             Index        RIGHT ATRIUM          Index LA diam:        2.80 cm 1.96 cm/m   RA Area:     9.79 cm LA Vol (A2C):   39.3 ml 27.56 ml/m  RA Volume:   21.20 ml 14.87 ml/m LA Vol (A4C):   32.0 ml 22.44 ml/m LA Biplane Vol: 35.8 ml 25.11 ml/m  AORTIC VALVE LVOT Vmax:   137.00 cm/s LVOT Vmean:  82.700 cm/s LVOT  VTI:    0.276 m  AORTA Ao Root diam: 2.90 cm Ao Asc diam:  2.80 cm MITRAL VALVE                TRICUSPID VALVE MV Area (PHT): 3.72 cm     TR Peak grad:   37.7 mmHg MV Decel Time: 204 msec     TR Vmax:        307.00 cm/s MV E velocity: 105.00 cm/s MV A velocity: 117.00 cm/s  SHUNTS MV E/A ratio:  0.90         Systemic VTI:  0.28 m                             Systemic Diam: 1.80 cm Aditya Sabharwal Electronically signed by Dorthula Nettles Signature Date/Time: 08/07/2023/2:56:56 PM    Final    DG Chest 2 View  Result Date: 08/06/2023 CLINICAL DATA:  Pneumonia EXAM: CHEST - 2 VIEW COMPARISON:  08/03/2023 FINDINGS: Heart size is normal. Postsurgical changes from prior left upper lobectomy with chronic scarring/fibrosis  at the left lung apex. Fiducial marker the left hilum and within the left upper lobe. No new airspace consolidation. No pleural effusion or pneumothorax. IMPRESSION: Stable postsurgical changes to the left chest. No new airspace consolidation. Electronically Signed   By: Duanne Guess D.O.   On: 08/06/2023 14:14   CT Angio Chest PE W/Cm &/Or Wo Cm  Result Date: 08/03/2023 CLINICAL DATA:  Low to intermediate probability pulmonary embolism, positive D-dimer. Left chest pain, hypoxia, history of lung cancer. EXAM: CT ANGIOGRAPHY CHEST WITH CONTRAST TECHNIQUE: Multidetector CT imaging of the chest was performed using the standard protocol during bolus administration of intravenous contrast. Multiplanar CT image reconstructions and MIPs were obtained to evaluate the vascular anatomy. RADIATION DOSE REDUCTION: This exam was performed according to the departmental dose-optimization program which includes automated exposure control, adjustment of the mA and/or kV according to patient size and/or use of iterative reconstruction technique. CONTRAST:  75mL OMNIPAQUE IOHEXOL 350 MG/ML SOLN COMPARISON:  None Available. FINDINGS: Cardiovascular: Adequate opacification of the pulmonary arterial tree. No intraluminal filling defect identified to suggest acute pulmonary embolism. Central pulmonary arteries are of normal caliber. Mild coronary artery calcification. Cardiac size within normal limits. No pericardial effusion. Mild atherosclerotic calcification within the thoracic aorta. No aortic aneurysm. Mediastinum/Nodes: No enlarged mediastinal, hilar, or axillary lymph nodes. Thyroid gland, trachea, and esophagus demonstrate no significant findings. Lungs/Pleura: Surgical changes of left upper lobectomy are identified with associated left-sided volume loss. Cylindrical and cystic bronchiectasis within the left apex appears stable since prior examination, possibly reflecting the sequela of remote or chronic infection.  Advanced emphysema. Stable subpleural mom scarring within the posterior right upper lobe. No superimposed confluent pulmonary infiltrate. There is diffuse bronchial wall thickening present with scattered areas of airway impaction in keeping with airway inflammation, similar to prior examination. No pneumothorax or pleural effusion Upper Abdomen: No acute abnormality. Musculoskeletal: No acute bone abnormality. No lytic or blastic bone lesion. Review of the MIP images confirms the above findings. IMPRESSION: 1. No pulmonary embolism. 2. Mild coronary artery calcification. 3. Stable surgical changes of left upper lobectomy. 4. Advanced emphysema 5. Stable diffuse bronchial wall thickening with scattered areas of airway impaction in keeping with airway inflammation. Aortic Atherosclerosis (ICD10-I70.0) and Emphysema (ICD10-J43.9). Electronically Signed   By: Helyn Numbers M.D.   On: 08/03/2023 22:37   DG Chest Port 1 View  Result Date: 08/03/2023 CLINICAL  DATA:  Left-sided chest pain.  History of lung cancer. EXAM: PORTABLE CHEST 1 VIEW COMPARISON:  Chest x-ray dated July 24, 2023. FINDINGS: Normal heart size. Similar postsurgical changes related to prior left upper lobectomy with left apical volume loss, scarring, and fibrosis. Associated leftward tracheal deviation. Chronic blunting of the left costophrenic angle. No focal consolidation, pleural effusion, or pneumothorax. No acute osseous abnormality. IMPRESSION: 1. No acute cardiopulmonary disease. 2. Stable postsurgical and post-treatment changes in the left lung. Electronically Signed   By: Obie Dredge M.D.   On: 08/03/2023 18:59   DG Chest 2 View  Result Date: 07/24/2023 CLINICAL DATA:  Shortness of breath.  History of lung cancer. EXAM: CHEST - 2 VIEW COMPARISON:  Chest radiograph dated June 24, 2023 FINDINGS: The heart size and mediastinal contours are unchanged. Postsurgical changes related to left upper lobectomy with left apical  pleural-parenchymal scarring, volume loss and similar leftward deviation of the superior mediastinum and trachea. No focal consolidation. Chronic blunting of the left costophrenic angle. No pleural effusion or pneumothorax. Emphysema. No acute osseous abnormality. IMPRESSION: No acute cardiopulmonary findings. Emphysema. Postsurgical changes related to left upper lobectomy. Electronically Signed   By: Hart Robinsons M.D.   On: 07/24/2023 20:53    Labs: BNP (last 3 results) Recent Labs    03/06/23 1938 04/16/23 2048 08/03/23 1723  BNP 54.6 114.4* 68.2   Basic Metabolic Panel: Recent Labs  Lab 08/03/23 1723 08/04/23 0422 08/05/23 0450 08/06/23 0503 08/07/23 0525  NA 140 136 137 141 135  K 3.9 4.1 3.8 3.8 3.2*  CL 99 98 99 99 96*  CO2 33* 32 34* 33* 31  GLUCOSE 99 222* 103* 93 70  BUN 20 21 24* 24* 23  CREATININE 0.57 0.56 0.72 0.65 0.64  CALCIUM 9.0 8.8* 8.8* 9.0 8.2*   Liver Function Tests: Recent Labs  Lab 08/03/23 1723 08/04/23 0422 08/05/23 0450 08/06/23 0503 08/07/23 0525  AST 14* 16 12* 12* 11*  ALT 15 14 12 11 11   ALKPHOS 70 67 65 59 59  BILITOT 0.3 0.2 0.4 0.4 0.3  PROT 6.7 6.5 6.2* 6.2* 5.9*  ALBUMIN 3.6 3.6 3.3* 3.4* 3.2*   No results for input(s): "LIPASE", "AMYLASE" in the last 168 hours. No results for input(s): "AMMONIA" in the last 168 hours. CBC: Recent Labs  Lab 08/03/23 1723 08/04/23 0422 08/05/23 0450 08/06/23 0503 08/07/23 0525  WBC 11.8* 6.1 15.7* 14.1* 13.7*  NEUTROABS 7.4  --  11.9* 9.8* 9.4*  HGB 13.0 12.3 11.8* 11.7* 11.2*  HCT 40.7 40.4 37.7 36.6 35.7*  MCV 96.7 97.6 95.4 93.8 95.5  PLT 263 283 261 233 230   Cardiac Enzymes: No results for input(s): "CKTOTAL", "CKMB", "CKMBINDEX", "TROPONINI" in the last 168 hours. BNP: Invalid input(s): "POCBNP" CBG: No results for input(s): "GLUCAP" in the last 168 hours. D-Dimer No results for input(s): "DDIMER" in the last 72 hours. Hgb A1c No results for input(s): "HGBA1C" in the last  72 hours. Lipid Profile No results for input(s): "CHOL", "HDL", "LDLCALC", "TRIG", "CHOLHDL", "LDLDIRECT" in the last 72 hours. Thyroid function studies No results for input(s): "TSH", "T4TOTAL", "T3FREE", "THYROIDAB" in the last 72 hours.  Invalid input(s): "FREET3" Anemia work up No results for input(s): "VITAMINB12", "FOLATE", "FERRITIN", "TIBC", "IRON", "RETICCTPCT" in the last 72 hours. Urinalysis    Component Value Date/Time   COLORURINE YELLOW 06/25/2023 0536   APPEARANCEUR CLEAR 06/25/2023 0536   LABSPEC 1.023 06/25/2023 0536   PHURINE 6.0 06/25/2023 0536   GLUCOSEU 50 (A)  06/25/2023 0536   HGBUR SMALL (A) 06/25/2023 0536   BILIRUBINUR NEGATIVE 06/25/2023 0536   KETONESUR NEGATIVE 06/25/2023 0536   PROTEINUR NEGATIVE 06/25/2023 0536   UROBILINOGEN 0.2 11/06/2009 1559   NITRITE NEGATIVE 06/25/2023 0536   LEUKOCYTESUR NEGATIVE 06/25/2023 0536   Sepsis Labs Recent Labs  Lab 08/04/23 0422 08/05/23 0450 08/06/23 0503 08/07/23 0525  WBC 6.1 15.7* 14.1* 13.7*   Microbiology Recent Results (from the past 240 hour(s))  Resp panel by RT-PCR (RSV, Flu A&B, Covid) Anterior Nasal Swab     Status: None   Collection Time: 08/03/23 10:15 PM   Specimen: Anterior Nasal Swab  Result Value Ref Range Status   SARS Coronavirus 2 by RT PCR NEGATIVE NEGATIVE Final    Comment: (NOTE) SARS-CoV-2 target nucleic acids are NOT DETECTED.  The SARS-CoV-2 RNA is generally detectable in upper respiratory specimens during the acute phase of infection. The lowest concentration of SARS-CoV-2 viral copies this assay can detect is 138 copies/mL. A negative result does not preclude SARS-Cov-2 infection and should not be used as the sole basis for treatment or other patient management decisions. A negative result may occur with  improper specimen collection/handling, submission of specimen other than nasopharyngeal swab, presence of viral mutation(s) within the areas targeted by this assay, and  inadequate number of viral copies(<138 copies/mL). A negative result must be combined with clinical observations, patient history, and epidemiological information. The expected result is Negative.  Fact Sheet for Patients:  BloggerCourse.com  Fact Sheet for Healthcare Providers:  SeriousBroker.it  This test is no t yet approved or cleared by the [place] FDA and  has been authorized for detection and/or diagnosis of SARS-CoV-2 by FDA under an Emergency Use Authorization (EUA). This EUA will remain  in effect (meaning this test can be used) for the duration of the COVID-19 declaration under Section 564(b)(1) of the Act, 21 U.S.C.section 360bbb-3(b)(1), unless the authorization is terminated  or revoked sooner.       Influenza A by PCR NEGATIVE NEGATIVE Final   Influenza B by PCR NEGATIVE NEGATIVE Final    Comment: (NOTE) The Xpert Xpress SARS-CoV-2/FLU/RSV plus assay is intended as an aid in the diagnosis of influenza from Nasopharyngeal swab specimens and should not be used as a sole basis for treatment. Nasal washings and aspirates are unacceptable for Xpert Xpress SARS-CoV-2/FLU/RSV testing.  Fact Sheet for Patients: BloggerCourse.com  Fact Sheet for Healthcare Providers: SeriousBroker.it  This test is not yet approved or cleared by the [place] FDA and has been authorized for detection and/or diagnosis of SARS-CoV-2 by FDA under an Emergency Use Authorization (EUA). This EUA will remain in effect (meaning this test can be used) for the duration of the COVID-19 declaration under Section 564(b)(1) of the Act, 21 U.S.C. section 360bbb-3(b)(1), unless the authorization is terminated or revoked.     Resp Syncytial Virus by PCR NEGATIVE NEGATIVE Final    Comment: (NOTE) Fact Sheet for Patients: BloggerCourse.com  Fact Sheet for Healthcare  Providers: SeriousBroker.it  This test is not yet approved or cleared by the [place] FDA and has been authorized for detection and/or diagnosis of SARS-CoV-2 by FDA under an Emergency Use Authorization (EUA). This EUA will remain in effect (meaning this test can be used) for the duration of the COVID-19 declaration under Section 564(b)(1) of the Act, 21 U.S.C. section 360bbb-3(b)(1), unless the authorization is terminated or revoked.  Performed at [facility], [address] [place], [place] [address]  Time coordinating discharge: 25 minutes  SIGNED: Lanae Boast, MD  [facility] 08/09/2023, 10:04 AM  If 7PM-7AM, please contact night-coverage [email or web address]

## 2023-09-11 ENCOUNTER — Other Ambulatory Visit: Payer: Self-pay

## 2023-09-11 ENCOUNTER — Emergency Department (HOSPITAL_COMMUNITY)
Admission: EM | Admit: 2023-09-11 | Discharge: 2023-09-12 | Disposition: A | Discharge status: Home / Self Care | Payer: 59 | Attending: Emergency Medicine | Admitting: Emergency Medicine

## 2023-09-11 ENCOUNTER — Emergency Department (HOSPITAL_COMMUNITY): Payer: 59

## 2023-09-11 DIAGNOSIS — E039 Hypothyroidism, unspecified: Secondary | ICD-10-CM | POA: Insufficient documentation

## 2023-09-11 DIAGNOSIS — J441 Chronic obstructive pulmonary disease with (acute) exacerbation: Secondary | ICD-10-CM | POA: Insufficient documentation

## 2023-09-11 DIAGNOSIS — I480 Paroxysmal atrial fibrillation: Secondary | ICD-10-CM | POA: Diagnosis not present

## 2023-09-11 DIAGNOSIS — Z7951 Long term (current) use of inhaled steroids: Secondary | ICD-10-CM | POA: Diagnosis not present

## 2023-09-11 DIAGNOSIS — Z79899 Other long term (current) drug therapy: Secondary | ICD-10-CM | POA: Diagnosis not present

## 2023-09-11 DIAGNOSIS — Z7901 Long term (current) use of anticoagulants: Secondary | ICD-10-CM | POA: Insufficient documentation

## 2023-09-11 DIAGNOSIS — Z85118 Personal history of other malignant neoplasm of bronchus and lung: Secondary | ICD-10-CM | POA: Insufficient documentation

## 2023-09-11 DIAGNOSIS — R079 Chest pain, unspecified: Secondary | ICD-10-CM | POA: Diagnosis present

## 2023-09-11 LAB — BASIC METABOLIC PANEL
Anion gap: 8 (ref 5–15)
BUN: 10 mg/dL (ref 8–23)
CO2: 31 mmol/L (ref 22–32)
Calcium: 9 mg/dL (ref 8.9–10.3)
Chloride: 99 mmol/L (ref 98–111)
Creatinine, Ser: 0.38 mg/dL — ABNORMAL LOW (ref 0.44–1.00)
GFR, Estimated: >60 mL/min (ref >60)
Glucose, Bld: 153 mg/dL — ABNORMAL HIGH (ref 70–99)
Potassium: 3.5 mmol/L (ref 3.5–5.1)
Sodium: 138 mmol/L (ref 135–145)

## 2023-09-11 LAB — CBC
HCT: 40.3 % (ref 36.0–46.0)
Hemoglobin: 12.8 g/dL (ref 12.0–15.0)
MCH: 30 pg (ref 26.0–34.0)
MCHC: 31.8 g/dL (ref 30.0–36.0)
MCV: 94.6 fL (ref 80.0–100.0)
Platelets: 249 10*3/uL (ref 150–400)
RBC: 4.26 MIL/uL (ref 3.87–5.11)
RDW: 12.7 % (ref 11.5–15.5)
WBC: 9.4 10*3/uL (ref 4.0–10.5)
nRBC: 0 % (ref 0.0–0.2)

## 2023-09-11 LAB — TROPONIN I (HIGH SENSITIVITY): Troponin I (High Sensitivity): 5 ng/L (ref <18)

## 2023-09-11 NOTE — ED Triage Notes (Signed)
 Pt BIBA from home. C/o SOB, chest pain (L lung) since Xmas. Pt has hx lung cancer.  On 2L [place] at baseline. Given 2x duonebs, 1 albuterol neb, and 125 Solumedrol IV.  AOx4

## 2023-09-12 ENCOUNTER — Encounter (HOSPITAL_COMMUNITY): Payer: Self-pay | Admitting: Emergency Medicine

## 2023-09-12 DIAGNOSIS — J441 Chronic obstructive pulmonary disease with (acute) exacerbation: Secondary | ICD-10-CM | POA: Diagnosis not present

## 2023-09-12 MED ORDER — IPRATROPIUM-ALBUTEROL 0.5-2.5 (3) MG/3ML IN SOLN
3.0000 mL | Freq: Once | RESPIRATORY_TRACT | Status: AC
  Administered 2023-09-12: 3 mL via RESPIRATORY_TRACT
  Filled 2023-09-12: qty 3

## 2023-09-12 MED ORDER — OXYCODONE HCL 5 MG PO TABS
5.0000 mg | ORAL_TABLET | Freq: Once | ORAL | Status: AC
  Administered 2023-09-12: 5 mg via ORAL
  Filled 2023-09-12: qty 1

## 2023-09-12 MED ORDER — PREDNISONE 50 MG PO TABS: ORAL_TABLET | ORAL | 0 refills | Status: DC

## 2023-09-12 MED ORDER — OXYCODONE HCL 5 MG PO TABS: 5.0000 mg | ORAL_TABLET | Freq: Four times a day (QID) | ORAL | 0 refills | Status: DC | PRN

## 2023-09-12 NOTE — ED Provider Notes (Signed)
 [place] EMERGENCY DEPARTMENT AT [facility] Provider Note   CSN: [id number] Arrival date & time: 09/11/23  1809     History  Chief Complaint  Patient presents with   Chest Pain   Arm Pain    Tracy Park is a 68 y.o. female.  The history is provided by the patient.  Patient with history of COPD, history remote lung cancer status post left upper lobe resection, chronic respiratory failure on oxygen  presents with chest pain. Patient reports prior to Christmas she began having left back and left chest pain mostly with breathing and coughing.  No fevers or vomiting.  No hemoptysis.  Patient has a history of PAF and is on anticoagulation  No recent falls or trauma.  She reports she waited until after the holidays to be seen in the ER as not to disrupt her holiday plans   Past Medical History:  Diagnosis Date   Anginal pain (HCC)    Anxiety    Bipolar disorder (HCC)    Cancer (HCC)    COPD (chronic obstructive pulmonary disease) (HCC)    Dyspnea    Family history of adverse reaction to anesthesia    History of kidney stones    Hydroureteronephrosis 08/16/2021   Hypothyroidism    Lung cancer (HCC)    Myocardial infarction (HCC)    Paroxysmal atrial fibrillation (HCC)    PTSD (post-traumatic stress disorder)    Sleep apnea    Thyroid  disease     Home Medications Prior to Admission medications   Medication Sig Start Date End Date Taking? Authorizing Provider  oxyCODONE  (ROXICODONE ) 5 MG immediate release tablet Take 1 tablet (5 mg total) by mouth every 6 (six) hours as needed for severe pain (pain score 7-10). 09/12/23  Yes Midge Golas, MD  predniSONE  (DELTASONE ) 50 MG tablet 1 tablet PO QD X4 days 09/12/23  Yes Midge Golas, MD  albuterol  (PROVENTIL ) (2.5 MG/3ML) 0.083% nebulizer solution Take 3 mLs (2.5 mg total) by nebulization every 4 (four) hours while awake for 3 days, THEN 3 mLs (2.5 mg total) every 4 (four) hours as needed for wheezing or  shortness of breath. 06/26/23 07/29/23  DanfordLonni SQUIBB, MD  albuterol  (VENTOLIN  HFA) 108 (90 Base) MCG/ACT inhaler Inhale 2 puffs into the lungs every 4 (four) hours as needed for wheezing or shortness of breath. 08/09/23   Christobal Guadalajara, MD  apixaban  (ELIQUIS ) 5 MG TABS tablet Take 1 tablet (5 mg total) by mouth 2 (two) times daily. 06/06/23   Barbarann Nest, MD  Budeson-Glycopyrrol-Formoterol  (BREZTRI  AEROSPHERE) 160-9-4.8 MCG/ACT AERO Inhale 1 puff into the lungs in the morning and at bedtime.    [provider]  levothyroxine  (SYNTHROID ) 125 MCG tablet Take 125 mcg by mouth daily before breakfast.    [provider]  magnesium  oxide (MAG-OX) 400 (240 Mg) MG tablet Take 400 mg by mouth in the morning.    [provider]  metoprolol  succinate (TOPROL -XL) 25 MG 24 hr tablet Take 0.5 tablets (12.5 mg total) by mouth daily. 06/22/23 07/24/23  Briana Elgin LABOR, MD  nitroGLYCERIN  (NITROSTAT ) 0.4 MG SL tablet Place 0.4 mg under the tongue every 5 (five) minutes as needed for chest pain.    [provider]  OXYGEN  Inhale 2 L/min into the lungs continuous.    [provider]  pantoprazole  (PROTONIX ) 40 MG tablet Take 1 tablet (40 mg total) by mouth daily for 14 days. 08/09/23 08/23/23  Christobal Guadalajara, MD  rosuvastatin  (CRESTOR ) 20 MG  tablet Take 20 mg by mouth daily.    [provider]  sertraline  (ZOLOFT ) 25 MG tablet Take 25 mg by mouth in the morning.    [provider]  SYSTANE COMPLETE PF 0.6 % SOLN Place 1 drop into both eyes 3 (three) times daily as needed (for dryness).    [provider]  TYLENOL  8 HOUR 650 MG CR tablet Take 650-1,300 mg by mouth 2 (two) times daily as needed for pain.    [provider]      Allergies    Red dye #40 (allura red), Strawberry extract, Tomato, Aspirin, Tape, and Wound dressing adhesive    Review of Systems   Review of Systems  Constitutional:  Negative for fever.  Respiratory:   Positive for cough.   Cardiovascular:  Positive for chest pain.    Physical Exam Updated Vital Signs BP 95/61 (BP Location: Left Arm)   Pulse 76   Temp 97.7 F (36.5 C) (Oral)   Resp 16   Ht 1.6 m (5' 3)   Wt 44 kg   SpO2 100%   BMI 17.18 kg/m  Physical Exam CONSTITUTIONAL: Chronically ill-appearing, resting on her side in no acute distress HEAD: Normocephalic/atraumatic ENMT: Mucous membranes moist NECK: supple no meningeal signs CV: S1/S2 noted, no murmurs/rubs/gallops noted LUNGS: Wheezing bilaterally, no acute distress, on 2 L nasal cannula Chest-no tenderness or bruising or crepitus ABDOMEN: soft, nontender NEURO: Pt is awake/alert/appropriate, moves all extremitiesx4.  No facial droop.   EXTREMITIES: pulses normal/equal, full ROM, no lower extremity edema SKIN: warm, color normal  ED Results / Procedures / Treatments   Labs (all labs ordered are listed, but only abnormal results are displayed) Labs Reviewed  BASIC METABOLIC PANEL - Abnormal; Notable for the following components:      Result Value   Glucose, Bld 153 (*)    Creatinine, Ser 0.38 (*)    All other components within normal limits  CBC  TROPONIN I (HIGH SENSITIVITY)    EKG EKG Interpretation Date/Time:  Friday September 12 2023 05:08:36 EST Ventricular Rate:  78 PR Interval:  136 QRS Duration:  102 QT Interval:  402 QTC Calculation: 458 R Axis:   93  Text Interpretation: Normal sinus rhythm Rightward axis Incomplete right bundle branch block Cannot rule out Anterior infarct , age undetermined Interpretation limited secondary to artifact Confirmed by Midge Golas ([id number]) on 09/12/2023 5:16:30 AM  Radiology DG Chest 2 View Result Date: 09/11/2023 CLINICAL DATA:  One-week history of shortness of breath and left-sided chest pain EXAM: CHEST - 2 VIEW COMPARISON:  Chest radiograph dated 08/06/2023 FINDINGS: Scattered surgical clips projecting over the left lung. Normal lung volumes. Similar irregular  left apical opacity and scarring. Increased conspicuity of patchy right apical opacity. No pleural effusion or pneumothorax. The heart size and mediastinal contours are within normal limits. No acute osseous abnormality. IMPRESSION: 1. Increased conspicuity of patchy right apical opacity, which may represent atelectasis, aspiration, or pneumonia. 2. Similar irregular left apical opacity and scarring. Electronically Signed   By: Limin  Xu M.D.   On: 09/11/2023 20:50    Procedures Procedures    Medications Ordered in ED Medications  ipratropium-albuterol  (DUONEB) 0.5-2.5 (3) MG/3ML nebulizer solution 3 mL (3 mLs Nebulization Given 09/12/23 0519)  oxyCODONE  (Oxy IR/ROXICODONE ) immediate release tablet 5 mg (5 mg Oral Given 09/12/23 0518)    ED Course/ Medical Decision Making/ A&P Clinical Course as of 09/12/23 9376  Fri Sep 12, 2023  0522 Patient history of COPD,  previous lung cancer presents with shortness of breath and chest pain she has had since before Christmas On my exam patient resting comfortably on her home oxygen , with wheezing bilaterally.  X-ray showed questionable atelectasis, but no obvious signs of acute pneumonia.  Low suspicion for PE as patient is had multiple workups previously and she is already on anticoagulation  Will treat wheezing and reassess.  She received nebs and steroids in route [DW]  0623 Overall patient is well-appearing, resting comfortably.  I do not feel further workup is warranted.  She appears proved after neb treatments.  Will treat as a COPD exacerbation, advised to use her nebs at home and will start prednisone  for 4 days.  Patient also given a short course of pain meds [DW]    Clinical Course User Index [DW] Midge Golas, MD                                 Medical Decision Making Amount and/or Complexity of Data Reviewed Labs: ordered. Radiology: ordered.  Risk Prescription drug management.   This patient presents to the ED for concern of  shortness of breath, this involves an extensive number of treatment options, and is a complaint that carries with it a high risk of complications and morbidity.  The differential diagnosis includes but is not limited to Acute coronary syndrome, pneumonia, acute pulmonary edema, pneumothorax, acute anemia, pulmonary embolism   Comorbidities that complicate the patient evaluation: Patient's presentation is complicated by their history of COPD and atrial fibrillation  Social Determinants of Health: Patient's  tobacco use   increases the complexity of managing their presentation  Additional history obtained: Records reviewed previous admission documents  Lab Tests: I Ordered, and personally interpreted labs.  The pertinent results include: Hyperglycemia  Imaging Studies ordered: I ordered imaging studies including X-ray chest   I independently visualized and interpreted imaging which showed probable atelectasis I agree with the radiologist interpretation  Cardiac Monitoring: The patient was maintained on a cardiac monitor.  I personally viewed and interpreted the cardiac monitor which showed an underlying rhythm of:  sinus rhythm  Medicines ordered and prescription drug management: I ordered medication including DuoNeb for wheezing Reevaluation of the patient after these medicines showed that the patient    improved   Reevaluation: After the interventions noted above, I reevaluated the patient and found that they have :improved  Complexity of problems addressed: Patient's presentation is most consistent with  acute presentation with potential threat to life or bodily function  Disposition: After consideration of the diagnostic results and the patient's response to treatment,  I feel that the patent would benefit from discharge   .           Final Clinical Impression(s) / ED Diagnoses Final diagnoses:  COPD exacerbation (HCC)    Rx / DC Orders ED Discharge Orders           Ordered    predniSONE  (DELTASONE ) 50 MG tablet        09/12/23 0621    oxyCODONE  (ROXICODONE ) 5 MG immediate release tablet  Every 6 hours PRN        09/12/23 9378              Midge Golas, MD 09/12/23 [phone]

## 2023-09-12 NOTE — ED Notes (Signed)
[facility] called to transport patient  

## 2023-09-12 NOTE — ED Notes (Signed)
 Pt is resting comfortably. Sats maintaining at 100% on 2 L [place].

## 2023-09-12 NOTE — ED Notes (Signed)
 Pt's sats 100% on 3 L. Titrated to 2 L [place] at this time and will continue to monitor.

## 2023-09-22 ENCOUNTER — Emergency Department (HOSPITAL_COMMUNITY): Payer: 59

## 2023-09-22 ENCOUNTER — Other Ambulatory Visit: Payer: Self-pay

## 2023-09-22 ENCOUNTER — Encounter (HOSPITAL_COMMUNITY): Payer: Self-pay | Admitting: Emergency Medicine

## 2023-09-22 ENCOUNTER — Inpatient Hospital Stay (HOSPITAL_COMMUNITY)
Admission: EM | Admit: 2023-09-22 | Discharge: 2023-09-28 | DRG: 191 | Disposition: A | Discharge status: Home / Self Care | Payer: 59 | Attending: Internal Medicine | Admitting: Internal Medicine

## 2023-09-22 DIAGNOSIS — Z79890 Hormone replacement therapy (postmenopausal): Secondary | ICD-10-CM | POA: Diagnosis not present

## 2023-09-22 DIAGNOSIS — J9610 Chronic respiratory failure, unspecified whether with hypoxia or hypercapnia: Secondary | ICD-10-CM | POA: Diagnosis present

## 2023-09-22 DIAGNOSIS — F418 Other specified anxiety disorders: Secondary | ICD-10-CM | POA: Diagnosis present

## 2023-09-22 DIAGNOSIS — Z681 Body mass index (BMI) 19 or less, adult: Secondary | ICD-10-CM | POA: Diagnosis not present

## 2023-09-22 DIAGNOSIS — E039 Hypothyroidism, unspecified: Secondary | ICD-10-CM | POA: Diagnosis present

## 2023-09-22 DIAGNOSIS — Z91018 Allergy to other foods: Secondary | ICD-10-CM | POA: Diagnosis not present

## 2023-09-22 DIAGNOSIS — E43 Unspecified severe protein-calorie malnutrition: Secondary | ICD-10-CM | POA: Diagnosis present

## 2023-09-22 DIAGNOSIS — Z833 Family history of diabetes mellitus: Secondary | ICD-10-CM

## 2023-09-22 DIAGNOSIS — G4733 Obstructive sleep apnea (adult) (pediatric): Secondary | ICD-10-CM | POA: Diagnosis present

## 2023-09-22 DIAGNOSIS — Z87442 Personal history of urinary calculi: Secondary | ICD-10-CM | POA: Diagnosis not present

## 2023-09-22 DIAGNOSIS — I10 Essential (primary) hypertension: Secondary | ICD-10-CM | POA: Diagnosis present

## 2023-09-22 DIAGNOSIS — J441 Chronic obstructive pulmonary disease with (acute) exacerbation: Secondary | ICD-10-CM | POA: Diagnosis present

## 2023-09-22 DIAGNOSIS — Z7901 Long term (current) use of anticoagulants: Secondary | ICD-10-CM

## 2023-09-22 DIAGNOSIS — J9611 Chronic respiratory failure with hypoxia: Secondary | ICD-10-CM | POA: Diagnosis not present

## 2023-09-22 DIAGNOSIS — Z79899 Other long term (current) drug therapy: Secondary | ICD-10-CM

## 2023-09-22 DIAGNOSIS — Z85118 Personal history of other malignant neoplasm of bronchus and lung: Secondary | ICD-10-CM

## 2023-09-22 DIAGNOSIS — Z9981 Dependence on supplemental oxygen: Secondary | ICD-10-CM | POA: Diagnosis not present

## 2023-09-22 DIAGNOSIS — F17210 Nicotine dependence, cigarettes, uncomplicated: Secondary | ICD-10-CM | POA: Diagnosis present

## 2023-09-22 DIAGNOSIS — Z91048 Other nonmedicinal substance allergy status: Secondary | ICD-10-CM | POA: Diagnosis not present

## 2023-09-22 DIAGNOSIS — I480 Paroxysmal atrial fibrillation: Secondary | ICD-10-CM | POA: Diagnosis present

## 2023-09-22 DIAGNOSIS — Z66 Do not resuscitate: Secondary | ICD-10-CM | POA: Diagnosis present

## 2023-09-22 DIAGNOSIS — I252 Old myocardial infarction: Secondary | ICD-10-CM

## 2023-09-22 DIAGNOSIS — Z90710 Acquired absence of both cervix and uterus: Secondary | ICD-10-CM

## 2023-09-22 DIAGNOSIS — E785 Hyperlipidemia, unspecified: Secondary | ICD-10-CM | POA: Diagnosis present

## 2023-09-22 DIAGNOSIS — Z1152 Encounter for screening for COVID-19: Secondary | ICD-10-CM | POA: Diagnosis not present

## 2023-09-22 DIAGNOSIS — Z8249 Family history of ischemic heart disease and other diseases of the circulatory system: Secondary | ICD-10-CM | POA: Diagnosis not present

## 2023-09-22 LAB — BASIC METABOLIC PANEL
Anion gap: 9 (ref 5–15)
BUN: 14 mg/dL (ref 8–23)
CO2: 30 mmol/L (ref 22–32)
Calcium: 8.8 mg/dL — ABNORMAL LOW (ref 8.9–10.3)
Chloride: 101 mmol/L (ref 98–111)
Creatinine, Ser: 0.5 mg/dL (ref 0.44–1.00)
GFR, Estimated: >60 mL/min (ref >60)
Glucose, Bld: 136 mg/dL — ABNORMAL HIGH (ref 70–99)
Potassium: 3.6 mmol/L (ref 3.5–5.1)
Sodium: 140 mmol/L (ref 135–145)

## 2023-09-22 LAB — CBC
HCT: 37.8 % (ref 36.0–46.0)
Hemoglobin: 11.6 g/dL — ABNORMAL LOW (ref 12.0–15.0)
MCH: 29.1 pg (ref 26.0–34.0)
MCHC: 30.7 g/dL (ref 30.0–36.0)
MCV: 95 fL (ref 80.0–100.0)
Platelets: 200 10*3/uL (ref 150–400)
RBC: 3.98 MIL/uL (ref 3.87–5.11)
RDW: 12.5 % (ref 11.5–15.5)
WBC: 9.9 10*3/uL (ref 4.0–10.5)
nRBC: 0 % (ref 0.0–0.2)

## 2023-09-22 LAB — RESP PANEL BY RT-PCR (RSV, FLU A&B, COVID)  RVPGX2
Influenza A by PCR: NEGATIVE
Influenza B by PCR: NEGATIVE
Resp Syncytial Virus by PCR: NEGATIVE
SARS Coronavirus 2 by RT PCR: NEGATIVE

## 2023-09-22 LAB — TROPONIN I (HIGH SENSITIVITY)
Troponin I (High Sensitivity): 6 ng/L (ref <18)
Troponin I (High Sensitivity): 2 ng/L (ref <18)

## 2023-09-22 MED ORDER — PREDNISONE 20 MG PO TABS: 40.0000 mg | ORAL_TABLET | Freq: Every day | ORAL | Status: DC

## 2023-09-22 MED ORDER — MAGNESIUM OXIDE -MG SUPPLEMENT 400 (240 MG) MG PO TABS
400.0000 mg | ORAL_TABLET | Freq: Every day | ORAL | Status: DC
  Administered 2023-09-23: 400 mg via ORAL
  Filled 2023-09-22: qty 1

## 2023-09-22 MED ORDER — IPRATROPIUM-ALBUTEROL 0.5-2.5 (3) MG/3ML IN SOLN: 3.0000 mL | Freq: Four times a day (QID) | RESPIRATORY_TRACT | Status: DC

## 2023-09-22 MED ORDER — METHYLPREDNISOLONE SODIUM SUCC 40 MG IJ SOLR: 40.0000 mg | Freq: Two times a day (BID) | INTRAMUSCULAR | Status: DC

## 2023-09-22 MED ORDER — SORBITOL 70 % SOLN: 30.0000 mL | Freq: Every day | Status: DC | PRN

## 2023-09-22 MED ORDER — SODIUM CHLORIDE 0.9 % IV SOLN
1.0000 g | Freq: Every day | INTRAVENOUS | Status: DC
  Administered 2023-09-22 – 2023-09-23 (×2): 1 g via INTRAVENOUS
  Filled 2023-09-22 (×3): qty 10

## 2023-09-22 MED ORDER — ONDANSETRON HCL 4 MG PO TABS: 4.0000 mg | ORAL_TABLET | Freq: Four times a day (QID) | ORAL | Status: DC | PRN

## 2023-09-22 MED ORDER — SERTRALINE HCL 25 MG PO TABS
25.0000 mg | ORAL_TABLET | Freq: Every day | ORAL | Status: DC
Start: 2023-09-23 — End: 2023-09-28
  Administered 2023-09-23 – 2023-09-28 (×6): 25 mg via ORAL
  Filled 2023-09-22 (×6): qty 1

## 2023-09-22 MED ORDER — LEVOTHYROXINE SODIUM 125 MCG PO TABS
125.0000 ug | ORAL_TABLET | Freq: Every day | ORAL | Status: DC
  Administered 2023-09-23 – 2023-09-28 (×6): 125 ug via ORAL
  Filled 2023-09-22 (×6): qty 1

## 2023-09-22 MED ORDER — METHYLPREDNISOLONE SODIUM SUCC 40 MG IJ SOLR
40.0000 mg | Freq: Two times a day (BID) | INTRAMUSCULAR | Status: AC
  Administered 2023-09-23 – 2023-09-28 (×11): 40 mg via INTRAVENOUS
  Filled 2023-09-22 (×11): qty 1

## 2023-09-22 MED ORDER — IPRATROPIUM-ALBUTEROL 0.5-2.5 (3) MG/3ML IN SOLN
3.0000 mL | Freq: Four times a day (QID) | RESPIRATORY_TRACT | Status: DC
  Administered 2023-09-23: 3 mL via RESPIRATORY_TRACT
  Filled 2023-09-22: qty 3

## 2023-09-22 MED ORDER — ONDANSETRON HCL 4 MG/2ML IJ SOLN: 4.0000 mg | Freq: Four times a day (QID) | INTRAMUSCULAR | Status: DC | PRN

## 2023-09-22 MED ORDER — ACETAMINOPHEN 325 MG PO TABS: 650.0000 mg | ORAL_TABLET | Freq: Four times a day (QID) | ORAL | Status: DC | PRN

## 2023-09-22 MED ORDER — ALBUTEROL SULFATE (2.5 MG/3ML) 0.083% IN NEBU: 2.5000 mg | INHALATION_SOLUTION | RESPIRATORY_TRACT | Status: DC | PRN

## 2023-09-22 MED ORDER — PANTOPRAZOLE SODIUM 40 MG PO TBEC
40.0000 mg | DELAYED_RELEASE_TABLET | Freq: Every day | ORAL | Status: DC
  Administered 2023-09-23 – 2023-09-28 (×6): 40 mg via ORAL
  Filled 2023-09-22 (×6): qty 1

## 2023-09-22 MED ORDER — PREDNISONE 20 MG PO TABS
40.0000 mg | ORAL_TABLET | Freq: Every day | ORAL | Status: DC
Start: 2023-09-28 — End: 2023-10-02

## 2023-09-22 MED ORDER — ENOXAPARIN SODIUM 30 MG/0.3ML IJ SOSY: 30.0000 mg | PREFILLED_SYRINGE | INTRAMUSCULAR | Status: DC

## 2023-09-22 MED ORDER — DOXYCYCLINE HYCLATE 100 MG PO TABS
100.0000 mg | ORAL_TABLET | Freq: Two times a day (BID) | ORAL | Status: DC
  Administered 2023-09-22 – 2023-09-27 (×10): 100 mg via ORAL
  Filled 2023-09-22 (×10): qty 1

## 2023-09-22 MED ORDER — IPRATROPIUM-ALBUTEROL 0.5-2.5 (3) MG/3ML IN SOLN
3.0000 mL | Freq: Once | RESPIRATORY_TRACT | Status: AC
  Administered 2023-09-22: 3 mL via RESPIRATORY_TRACT
  Filled 2023-09-22: qty 3

## 2023-09-22 MED ORDER — ACETAMINOPHEN 650 MG RE SUPP: 650.0000 mg | Freq: Four times a day (QID) | RECTAL | Status: DC | PRN

## 2023-09-22 MED ORDER — POTASSIUM CHLORIDE 20 MEQ PO PACK
20.0000 meq | PACK | Freq: Once | ORAL | Status: AC
  Administered 2023-09-22: 20 meq via ORAL
  Filled 2023-09-22: qty 1

## 2023-09-22 MED ORDER — APIXABAN 5 MG PO TABS
5.0000 mg | ORAL_TABLET | Freq: Two times a day (BID) | ORAL | Status: DC
  Administered 2023-09-22 – 2023-09-28 (×12): 5 mg via ORAL
  Filled 2023-09-22 (×12): qty 1

## 2023-09-22 MED ORDER — ALBUTEROL SULFATE (2.5 MG/3ML) 0.083% IN NEBU
10.0000 mg/h | INHALATION_SOLUTION | Freq: Once | RESPIRATORY_TRACT | Status: AC
  Administered 2023-09-22: 10 mg/h via RESPIRATORY_TRACT
  Filled 2023-09-22: qty 12

## 2023-09-22 MED ORDER — MAGNESIUM SULFATE 2 GM/50ML IV SOLN
2.0000 g | Freq: Once | INTRAVENOUS | Status: AC
Start: 2023-09-22 — End: 2023-09-22
  Administered 2023-09-22: 2 g via INTRAVENOUS
  Filled 2023-09-22: qty 50

## 2023-09-22 MED ORDER — RISAQUAD PO CAPS
1.0000 | ORAL_CAPSULE | Freq: Every day | ORAL | Status: DC
  Administered 2023-09-22 – 2023-09-28 (×7): 1 via ORAL
  Filled 2023-09-22 (×8): qty 1

## 2023-09-22 NOTE — ED Provider Notes (Signed)
 [place] EMERGENCY DEPARTMENT AT [facility] Provider Note   CSN: [id number] Arrival date & time: 09/22/23  1728     History  Chief Complaint  Patient presents with   Shortness of Breath    Tracy Park is a 68 y.o. female with medical history of COPD, lung cancer in remission, paroxysmal A-fib on Eliquis , hypothyroid.  Patient reports to ED for evaluation of shortness of breath.  Patient reports that she is still smoking cigarettes, states that her last cigarette was at 3 AM this morning.  She is her complaining of shortness of breath.  States that her shortness of breath began on Friday and has been persistent since onset.  She is also endorsing chest pain on the left side of her chest.  Reports that she uses oxygen  at home, 2 L via nasal cannula, and has not increased it recently.  Denies leg swelling, hemoptysis.  She is endorsing a cough and wheezing.  Denies lightheadedness, dizziness, weakness, nausea, vomiting, abdominal pain.  Denies calf pain.   Shortness of Breath Associated symptoms: chest pain, cough and wheezing   Associated symptoms: no fever and no vomiting        Home Medications Prior to Admission medications   Medication Sig Start Date End Date Taking? Authorizing Provider  albuterol  (PROVENTIL ) (2.5 MG/3ML) 0.083% nebulizer solution Take 3 mLs (2.5 mg total) by nebulization every 4 (four) hours while awake for 3 days, THEN 3 mLs (2.5 mg total) every 4 (four) hours as needed for wheezing or shortness of breath. 06/26/23 07/29/23  DanfordLonni SQUIBB, MD  albuterol  (VENTOLIN  HFA) 108 (90 Base) MCG/ACT inhaler Inhale 2 puffs into the lungs every 4 (four) hours as needed for wheezing or shortness of breath. 08/09/23   Christobal Guadalajara, MD  apixaban  (ELIQUIS ) 5 MG TABS tablet Take 1 tablet (5 mg total) by mouth 2 (two) times daily. 06/06/23   Barbarann Nest, MD  Budeson-Glycopyrrol-Formoterol  (BREZTRI  AEROSPHERE) 160-9-4.8 MCG/ACT AERO Inhale 1 puff into  the lungs in the morning and at bedtime.    [provider]  levothyroxine  (SYNTHROID ) 125 MCG tablet Take 125 mcg by mouth daily before breakfast.    [provider]  magnesium  oxide (MAG-OX) 400 (240 Mg) MG tablet Take 400 mg by mouth in the morning.    [provider]  metoprolol  succinate (TOPROL -XL) 25 MG 24 hr tablet Take 0.5 tablets (12.5 mg total) by mouth daily. 06/22/23 07/24/23  Briana Elgin LABOR, MD  nitroGLYCERIN  (NITROSTAT ) 0.4 MG SL tablet Place 0.4 mg under the tongue every 5 (five) minutes as needed for chest pain.    [provider]  oxyCODONE  (ROXICODONE ) 5 MG immediate release tablet Take 1 tablet (5 mg total) by mouth every 6 (six) hours as needed for severe pain (pain score 7-10). 09/12/23   Midge Golas, MD  OXYGEN  Inhale 2 L/min into the lungs continuous.    [provider]  pantoprazole  (PROTONIX ) 40 MG tablet Take 1 tablet (40 mg total) by mouth daily for 14 days. 08/09/23 08/23/23  Christobal Guadalajara, MD  predniSONE  (DELTASONE ) 50 MG tablet 1 tablet PO QD X4 days 09/12/23   Midge Golas, MD  rosuvastatin  (CRESTOR ) 20 MG tablet Take 20 mg by mouth daily.    [provider]  sertraline  (ZOLOFT ) 25 MG tablet Take 25 mg by mouth in the morning.    [provider]  SYSTANE COMPLETE PF 0.6 % SOLN Place 1 drop into both eyes 3 (three) times daily as  needed (for dryness).    [provider]  TYLENOL  8 HOUR 650 MG CR tablet Take 650-1,300 mg by mouth 2 (two) times daily as needed for pain.    [provider]      Allergies    Red dye #40 (allura red), Strawberry extract, Tomato, Aspirin, Tape, and Wound dressing adhesive    Review of Systems   Review of Systems  Constitutional:  Negative for fever.  Respiratory:  Positive for cough, shortness of breath and wheezing.   Cardiovascular:  Positive for chest pain. Negative for leg swelling.  Gastrointestinal:  Negative for nausea and vomiting.  All other  systems reviewed and are negative.   Physical Exam Updated Vital Signs BP 96/70   Pulse 96   Temp 98.1 F (36.7 C) (Oral)   Resp (!) 27   SpO2 92%  Physical Exam Vitals and nursing note reviewed.  Constitutional:      General: She is not in acute distress.    Appearance: She is well-developed.  HENT:     Head: Normocephalic and atraumatic.  Eyes:     Conjunctiva/sclera: Conjunctivae normal.  Cardiovascular:     Rate and Rhythm: Normal rate and regular rhythm.     Heart sounds: No murmur heard. Pulmonary:     Effort: Pulmonary effort is normal. No respiratory distress.     Breath sounds: Wheezing present.     Comments: Distant lung sounds bilaterally.  Wheezing appreciated in bilateral lung fields Abdominal:     Palpations: Abdomen is soft.     Tenderness: There is no abdominal tenderness.  Musculoskeletal:        General: No swelling.     Cervical back: Neck supple.     Right lower leg: No edema.     Left lower leg: No edema.  Skin:    General: Skin is warm and dry.     Capillary Refill: Capillary refill takes less than 2 seconds.  Neurological:     Mental Status: She is alert.  Psychiatric:        Mood and Affect: Mood normal.     ED Results / Procedures / Treatments   Labs (all labs ordered are listed, but only abnormal results are displayed) Labs Reviewed  CBC - Abnormal; Notable for the following components:      Result Value   Hemoglobin 11.6 (*)    All other components within normal limits  BASIC METABOLIC PANEL - Abnormal; Notable for the following components:   Glucose, Bld 136 (*)    Calcium  8.8 (*)    All other components within normal limits  RESP PANEL BY RT-PCR (RSV, FLU A&B, COVID)  RVPGX2  BLOOD GAS, VENOUS  HIV ANTIBODY (ROUTINE TESTING W REFLEX)  BASIC METABOLIC PANEL  CBC  MAGNESIUM   TROPONIN I (HIGH SENSITIVITY)  TROPONIN I (HIGH SENSITIVITY)    EKG None  Radiology DG Chest 2 View Result Date: 09/22/2023 CLINICAL DATA:   Shortness of breath EXAM: CHEST - 2 VIEW COMPARISON:  09/11/2023 FINDINGS: Left apical scarring with volume loss and upper hilar retraction. Lungs are otherwise clear. Chronic blunting of the left costophrenic angle without definite pleural effusion. No pneumothorax. Heart is normal in size. Visualized osseous structures are within normal limits. IMPRESSION: Left apical scarring with volume loss, as above. No acute cardiopulmonary disease. Electronically Signed   By: Pinkie Pebbles M.D.   On: 09/22/2023 19:23    Procedures Procedures   Medications Ordered in ED Medications  magnesium  sulfate IVPB  2 g 50 mL (2 g Intravenous New Bag/Given 09/22/23 2135)  acetaminophen  (TYLENOL ) tablet 650 mg (has no administration in time range)    Or  acetaminophen  (TYLENOL ) suppository 650 mg (has no administration in time range)  ondansetron  (ZOFRAN ) tablet 4 mg (has no administration in time range)    Or  ondansetron  (ZOFRAN ) injection 4 mg (has no administration in time range)  sorbitol  70 % solution 30 mL (has no administration in time range)  albuterol  (PROVENTIL ) (2.5 MG/3ML) 0.083% nebulizer solution 2.5 mg (has no administration in time range)  ipratropium-albuterol  (DUONEB) 0.5-2.5 (3) MG/3ML nebulizer solution 3 mL (has no administration in time range)  methylPREDNISolone  sodium succinate (SOLU-MEDROL ) 40 mg/mL injection 40 mg (has no administration in time range)    Followed by  predniSONE  (DELTASONE ) tablet 40 mg (has no administration in time range)  potassium chloride  (KLOR-CON ) packet 20 mEq (has no administration in time range)  acidophilus (RISAQUAD) capsule 1 capsule (has no administration in time range)  ipratropium-albuterol  (DUONEB) 0.5-2.5 (3) MG/3ML nebulizer solution 3 mL (3 mLs Nebulization Given 09/22/23 1813)  albuterol  (PROVENTIL ) (2.5 MG/3ML) 0.083% nebulizer solution (10 mg/hr Nebulization Given 09/22/23 1830)    ED Course/ Medical Decision Making/ A&P  Medical Decision  Making Amount and/or Complexity of Data Reviewed Labs: ordered. Radiology: ordered.  Risk Prescription drug management. Decision regarding hospitalization.   68 year old female presents for evaluation.  Please see HPI for further details.  On examination patient is afebrile, nontachycardic.  Her lung sounds of wheezing bilaterally, distant lung sounds, she is on 2 L of oxygen  via nasal cannula.  Abdomen soft and compressible.  No edema to bilateral lower extremities.  Neurological examination is at baseline.  Will collect labs to include CBC, BMP.  Will collect troponins, EKG, chest x-ray.  Patient was provided 125 Solu-Medrol  with EMS as well as 2 duo nebulizers.  Will order additional duo nebulizer at this time.  Will order magnesium  sulfate 2 g.  Checks x-ray unremarkable.  CBC without leukocytosis or anemia.  Metabolic panel unremarkable.  Troponin 6, delta pending at this time.  EKG is nonischemic.  Chest x-ray unremarkable.  After DuoNeb was administered, patient continued to wheeze and complaint of shortness of breath.  Continuous duo nebulizer administered at this time at 10 mg an hour.  On reassessment patient continues to wheeze.  Will admit patient for COPD exacerbation.  Spoke with Dr. Arthea of [facility].  Dr. Arthea has agreed to admit the patient.  Patient amenable to plan.   Final Clinical Impression(s) / ED Diagnoses Final diagnoses:  COPD exacerbation [facility])    Rx / DC Orders ED Discharge Orders     None         Ruthell Lonni FALCON, PA-C 09/22/23 2210    Neysa Caron PARAS, DO 09/22/23 2307

## 2023-09-22 NOTE — ED Notes (Signed)
 Lab called for add on troponin and RT notified of continuous neb

## 2023-09-22 NOTE — ED Triage Notes (Signed)
 BIB EMS from home.  SHOB and increasing difficulty with moving air since Friday.  Pt has hx of lung CA, last tx in 2018.  On 2L [place] at home. Has instructions to increase to 3L if needed but did not seem to help.  Pt has received 2 duoneb treatments from EMS and 125 solu medrol .  20G LW.  Lung sounds diminished in all fields.

## 2023-09-22 NOTE — H&P (Addendum)
 History and Physical    Patient: Tracy Park FMW:[id number] DOB: 02/03/1956 DOA: 09/22/2023 DOS: the patient was seen and examined on 09/22/2023 PCP: Donelda Evalene PARAS, PA-C  Patient coming from: Home  Chief Complaint:  Chief Complaint  Patient presents with   Shortness of Breath   HPI: Tracy Park is a 68 y.o. female with medical history significant for remote lung cancer status post left upper lobe resection greater than 5 years ago.  She is now in remission.  She also has advanced COPD and is on 2 L O2 nasal cannula at all times.  She has history of paroxysmal atrial fibrillation and is on Eliquis , hypertension, anxiety, and depression.  She presents today with significant worsening of her wheezing and shortness of breath.  The patient was seen in the emergency department last week for the same she was given a 5-day course of prednisone .  The prednisone  finished 2 days ago.  About 3 days ago she really felt her wheezing get worse.  The patient has a nebulizer machine at home. She has been using that to no avail.  The patient was brought in by EMS.  She was given Solu-Medrol  in the ambulance and in the emergency department she received an hour-long continuous neb.  Despite these interventions she continued to be quite wheezy so the hospitalist were called to admit the patient. She has had some discomfort in the back of her chest when she breathes. Some diffuse sweating as well.  COVID and flu and RSV are negative.  Review of Systems: As mentioned in the history of present illness. All other systems reviewed and are negative. Past Medical History:  Diagnosis Date   Anginal pain (HCC)    Anxiety    Bipolar disorder (HCC)    Cancer (HCC)    COPD (chronic obstructive pulmonary disease) (HCC)    Dyspnea    Family history of adverse reaction to anesthesia    History of kidney stones    Hydroureteronephrosis 08/16/2021   Hypothyroidism    Lung cancer (HCC)    Myocardial infarction  (HCC)    Paroxysmal atrial fibrillation (HCC)    PTSD (post-traumatic stress disorder)    Sleep apnea    Thyroid  disease    Past Surgical History:  Procedure Laterality Date   ABDOMINAL HYSTERECTOMY     BACK SURGERY     CYSTOSCOPY W/ URETERAL STENT PLACEMENT Right 05/03/2021   Procedure: CYSTOSCOPY WITH RETROGRADE PYELOGRAM/URETERAL STENT PLACEMENT;  Surgeon: Cam Morene ORN, MD;  Location: [facility];  Service: Urology;  Laterality: Right;   EYE SURGERY     kidney stent     thyroidectomy     Social History:  reports that she has been smoking cigarettes. She has never used smokeless tobacco. She reports that she does not currently use alcohol . She reports that she does not currently use drugs.  Allergies  Allergen Reactions   Red Dye #40 (Allura Red) Hives, Itching and Other (See Comments)    Red food dye   Strawberry Extract Hives and Itching   Tomato Hives and Itching   Aspirin Hives   Tape Rash and Other (See Comments)    Prefers paper tape   Wound Dressing Adhesive Rash    Family History  Problem Relation Age of Onset   Hypertension Mother    Heart failure Mother    Hypertension Father    Diabetes Father    Heart failure Father     Prior to Admission medications  Medication Sig Start Date End Date Taking? Authorizing Provider  albuterol  (PROVENTIL ) (2.5 MG/3ML) 0.083% nebulizer solution Take 3 mLs (2.5 mg total) by nebulization every 4 (four) hours while awake for 3 days, THEN 3 mLs (2.5 mg total) every 4 (four) hours as needed for wheezing or shortness of breath. 06/26/23 07/29/23  DanfordLonni SQUIBB, MD  albuterol  (VENTOLIN  HFA) 108 (90 Base) MCG/ACT inhaler Inhale 2 puffs into the lungs every 4 (four) hours as needed for wheezing or shortness of breath. 08/09/23   Christobal Guadalajara, MD  apixaban  (ELIQUIS ) 5 MG TABS tablet Take 1 tablet (5 mg total) by mouth 2 (two) times daily. 06/06/23   Barbarann Nest, MD  Budeson-Glycopyrrol-Formoterol  (BREZTRI  AEROSPHERE) 160-9-4.8  MCG/ACT AERO Inhale 1 puff into the lungs in the morning and at bedtime.    [provider]  levothyroxine  (SYNTHROID ) 125 MCG tablet Take 125 mcg by mouth daily before breakfast.    [provider]  magnesium  oxide (MAG-OX) 400 (240 Mg) MG tablet Take 400 mg by mouth in the morning.    [provider]  metoprolol  succinate (TOPROL -XL) 25 MG 24 hr tablet Take 0.5 tablets (12.5 mg total) by mouth daily. 06/22/23 07/24/23  Briana Elgin LABOR, MD  nitroGLYCERIN  (NITROSTAT ) 0.4 MG SL tablet Place 0.4 mg under the tongue every 5 (five) minutes as needed for chest pain.    [provider]  oxyCODONE  (ROXICODONE ) 5 MG immediate release tablet Take 1 tablet (5 mg total) by mouth every 6 (six) hours as needed for severe pain (pain score 7-10). 09/12/23   Midge Golas, MD  OXYGEN  Inhale 2 L/min into the lungs continuous.    [provider]  pantoprazole  (PROTONIX ) 40 MG tablet Take 1 tablet (40 mg total) by mouth daily for 14 days. 08/09/23 08/23/23  Christobal Guadalajara, MD  rosuvastatin  (CRESTOR ) 20 MG tablet Take 20 mg by mouth daily.    [provider]  sertraline  (ZOLOFT ) 25 MG tablet Take 25 mg by mouth in the morning.    [provider]  SYSTANE COMPLETE PF 0.6 % SOLN Place 1 drop into both eyes 3 (three) times daily as needed (for dryness).    [provider]  TYLENOL  8 HOUR 650 MG CR tablet Take 650-1,300 mg by mouth 2 (two) times daily as needed for pain.    [provider]    Physical Exam: Vitals:   09/22/23 1740 09/22/23 2052 09/22/23 2100  BP: 103/66 (!) 93/59 96/70  Pulse: 96 95 96  Resp: (!) 23 (!) 24 (!) 27  Temp: 98.2 F (36.8 C) 98.1 F (36.7 C)   TempSrc: Oral Oral   SpO2: 96% 95% 92%   Physical Exam:  General: No acute distress, well developed, thin HEENT: Normocephalic, atraumatic, PERRL Cardiovascular: Normal rate and rhythm. Distal pulses intact. Pulmonary: Normal pulmonary effort, mild - mod exp  wheezes on left. Distant bs and less expiratory wheezing on right posteriorly  Gastrointestinal: Nondistended abdomen, soft, non-tender, normoactive bowel sounds Musculoskeletal:Normal ROM, no lower ext edema Skin: Skin is warm and dry. Neuro: No focal deficits noted, AAOx3. PSYCH: Attentive and cooperative  Data Reviewed:  Results for orders placed or performed during the hospital encounter of 09/22/23 (from the past 24 hours)  CBC     Status: Abnormal   Collection Time: 09/22/23  6:09 PM  Result Value Ref Range   WBC 9.9 4.0 - 10.5 K/uL   RBC 3.98 3.87 - 5.11 MIL/uL   Hemoglobin 11.6 (L) 12.0 - 15.0  g/dL   HCT 62.1 63.9 - 53.9 %   MCV 95.0 80.0 - 100.0 fL   MCH 29.1 26.0 - 34.0 pg   MCHC 30.7 30.0 - 36.0 g/dL   RDW 87.4 88.4 - 84.4 %   Platelets 200 150 - 400 K/uL   nRBC 0.0 0.0 - 0.2 %  Basic metabolic panel     Status: Abnormal   Collection Time: 09/22/23  6:09 PM  Result Value Ref Range   Sodium 140 135 - 145 mmol/L   Potassium 3.6 3.5 - 5.1 mmol/L   Chloride 101 98 - 111 mmol/L   CO2 30 22 - 32 mmol/L   Glucose, Bld 136 (H) 70 - 99 mg/dL   BUN 14 8 - 23 mg/dL   Creatinine, Ser 9.49 0.44 - 1.00 mg/dL   Calcium  8.8 (L) 8.9 - 10.3 mg/dL   GFR, Estimated >39 >39 mL/min   Anion gap 9 5 - 15  Troponin I (High Sensitivity)     Status: None   Collection Time: 09/22/23  6:09 PM  Result Value Ref Range   Troponin I (High Sensitivity) 6 <18 ng/L     Assessment and Plan: COPD exacerbation -nebs, steroids, antibiotics per protocol - Continue her chronic 2 L O2 nasal cannula  2. P afib -continue Eliquis .  Its unclear if she is still on Metoprolol .  3.  History of left upper lobe lobectomy and lung cancer greater than 5 years ago.  Now in remission.  Her last CT was done less than 2 months ago during her last hospitalization.  4.  Hypothyroidism - continue Synthroid   5.  Hypertension -  continue outpatient medication    Advance Care Planning:   Code Status: Limited: Do  not attempt resuscitation (DNR) -DNR-LIMITED -Do Not Intubate/DNI she names her daughter Odella as her surrogate decision maker and wants to be DNR and DNI.  Consults: none  Family Communication: none  Severity of Illness: The appropriate patient status for this patient is INPATIENT. Inpatient status is judged to be reasonable and necessary in order to provide the required intensity of service to ensure the patient's safety. The patient's presenting symptoms, physical exam findings, and initial radiographic and laboratory data in the context of their chronic comorbidities is felt to place them at high risk for further clinical deterioration. Furthermore, it is not anticipated that the patient will be medically stable for discharge from the hospital within 2 midnights of admission.   * I certify that at the point of admission it is my clinical judgment that the patient will require inpatient hospital care spanning beyond 2 midnights from the point of admission due to high intensity of service, high risk for further deterioration and high frequency of surveillance required.*  Author: [name], MD 09/22/2023 10:21 PM  For on call review [email or web address].

## 2023-09-23 DIAGNOSIS — J441 Chronic obstructive pulmonary disease with (acute) exacerbation: Secondary | ICD-10-CM | POA: Diagnosis not present

## 2023-09-23 LAB — CBC
HCT: 39.6 % (ref 36.0–46.0)
Hemoglobin: 12.3 g/dL (ref 12.0–15.0)
MCH: 29.3 pg (ref 26.0–34.0)
MCHC: 31.1 g/dL (ref 30.0–36.0)
MCV: 94.3 fL (ref 80.0–100.0)
Platelets: 225 10*3/uL (ref 150–400)
RBC: 4.2 MIL/uL (ref 3.87–5.11)
RDW: 12.6 % (ref 11.5–15.5)
WBC: 7.3 10*3/uL (ref 4.0–10.5)
nRBC: 0 % (ref 0.0–0.2)

## 2023-09-23 LAB — BLOOD GAS, VENOUS
Acid-Base Excess: 8.5 mmol/L — ABNORMAL HIGH (ref 0.0–2.0)
Bicarbonate: 35.3 mmol/L — ABNORMAL HIGH (ref 20.0–28.0)
O2 Saturation: 74.8 %
Patient temperature: 36.7
pCO2, Ven: 56 mm[Hg] (ref 44–60)
pH, Ven: 7.4 (ref 7.25–7.43)
pO2, Ven: 38 mm[Hg] (ref 32–45)

## 2023-09-23 LAB — BASIC METABOLIC PANEL
Anion gap: 8 (ref 5–15)
BUN: 18 mg/dL (ref 8–23)
CO2: 29 mmol/L (ref 22–32)
Calcium: 8.6 mg/dL — ABNORMAL LOW (ref 8.9–10.3)
Chloride: 100 mmol/L (ref 98–111)
Creatinine, Ser: 0.44 mg/dL (ref 0.44–1.00)
GFR, Estimated: >60 mL/min (ref >60)
Glucose, Bld: 114 mg/dL — ABNORMAL HIGH (ref 70–99)
Potassium: 3.9 mmol/L (ref 3.5–5.1)
Sodium: 137 mmol/L (ref 135–145)

## 2023-09-23 LAB — MAGNESIUM: Magnesium: 2.1 mg/dL (ref 1.7–2.4)

## 2023-09-23 LAB — HIV ANTIBODY (ROUTINE TESTING W REFLEX): HIV Screen 4th Generation wRfx: NONREACTIVE

## 2023-09-23 MED ORDER — BUDESONIDE 0.25 MG/2ML IN SUSP
0.2500 mg | Freq: Two times a day (BID) | RESPIRATORY_TRACT | Status: DC
  Administered 2023-09-23 – 2023-09-28 (×11): 0.25 mg via RESPIRATORY_TRACT
  Filled 2023-09-23 (×11): qty 2

## 2023-09-23 MED ORDER — B COMPLEX-C PO TABS
1.0000 | ORAL_TABLET | Freq: Every day | ORAL | Status: DC
  Administered 2023-09-23 – 2023-09-28 (×6): 1 via ORAL
  Filled 2023-09-23 (×6): qty 1

## 2023-09-23 MED ORDER — ENSURE ENLIVE PO LIQD
237.0000 mL | Freq: Two times a day (BID) | ORAL | Status: DC
  Administered 2023-09-23: 237 mL via ORAL

## 2023-09-23 MED ORDER — ROSUVASTATIN CALCIUM 10 MG PO TABS
20.0000 mg | ORAL_TABLET | Freq: Every day | ORAL | Status: DC
  Administered 2023-09-23 – 2023-09-28 (×6): 20 mg via ORAL
  Filled 2023-09-23 (×6): qty 2

## 2023-09-23 MED ORDER — MAGNESIUM OXIDE -MG SUPPLEMENT 400 (240 MG) MG PO TABS
400.0000 mg | ORAL_TABLET | Freq: Two times a day (BID) | ORAL | Status: DC
  Administered 2023-09-23 – 2023-09-28 (×10): 400 mg via ORAL
  Filled 2023-09-23 (×10): qty 1

## 2023-09-23 MED ORDER — POLYVINYL ALCOHOL 1.4 % OP SOLN: 1.0000 [drp] | Freq: Three times a day (TID) | OPHTHALMIC | Status: DC | PRN

## 2023-09-23 MED ORDER — GUAIFENESIN-DM 100-10 MG/5ML PO SYRP
5.0000 mL | ORAL_SOLUTION | ORAL | Status: DC | PRN
  Administered 2023-09-24 – 2023-09-27 (×4): 5 mL via ORAL
  Filled 2023-09-23 (×4): qty 5

## 2023-09-23 MED ORDER — METOPROLOL SUCCINATE ER 25 MG PO TB24
12.5000 mg | ORAL_TABLET | Freq: Every day | ORAL | Status: DC
  Administered 2023-09-23: 12.5 mg via ORAL
  Administered 2023-09-24: 12.25 mg via ORAL
  Administered 2023-09-25 – 2023-09-28 (×4): 12.5 mg via ORAL
  Filled 2023-09-23 (×6): qty 1

## 2023-09-23 MED ORDER — ENSURE ENLIVE PO LIQD
237.0000 mL | Freq: Three times a day (TID) | ORAL | Status: DC
  Administered 2023-09-23 – 2023-09-28 (×13): 237 mL via ORAL

## 2023-09-23 MED ORDER — OXYCODONE HCL 5 MG PO TABS
5.0000 mg | ORAL_TABLET | Freq: Four times a day (QID) | ORAL | Status: DC | PRN
  Administered 2023-09-23 – 2023-09-28 (×8): 5 mg via ORAL
  Filled 2023-09-23 (×8): qty 1

## 2023-09-23 MED ORDER — POTASSIUM CHLORIDE CRYS ER 20 MEQ PO TBCR
40.0000 meq | EXTENDED_RELEASE_TABLET | Freq: Once | ORAL | Status: AC
Start: 2023-09-23 — End: 2023-09-23
  Administered 2023-09-23: 40 meq via ORAL
  Filled 2023-09-23: qty 2

## 2023-09-23 MED ORDER — IPRATROPIUM-ALBUTEROL 0.5-2.5 (3) MG/3ML IN SOLN
3.0000 mL | RESPIRATORY_TRACT | Status: DC | PRN
  Administered 2023-09-23: 3 mL via RESPIRATORY_TRACT
  Filled 2023-09-23: qty 3

## 2023-09-23 MED ORDER — IPRATROPIUM-ALBUTEROL 0.5-2.5 (3) MG/3ML IN SOLN: 3.0000 mL | Freq: Three times a day (TID) | RESPIRATORY_TRACT | Status: DC

## 2023-09-23 NOTE — Evaluation (Signed)
 Physical Therapy Evaluation Patient Details Name: Tracy Park MRN: [id number] DOB: 09/11/1955 Today's Date: 09/23/2023  History of Present Illness  Pt is 68 yo female admitted on 09/22/23 with acute COPD exacerbation, afib.  Pt with hx including but not limited to COPD on 2 L O2, lung CA s/p LUL resection in remission, OSA, CAD, HLD, anxiety, depression, bipolar, chest pain  Clinical Impression  Pt admitted with above diagnosis.  She has been ambulating in her room independently.  Pt demonstrated safe transfers and ambulation of 80' without AD safely.  She was on 2 L O2 with sats 93% rest and 89% activity with quick recover.  Pt reports has necessary DME at home, other than not getting enough portable O2 tanks and has been calling [employer] to try to resolve.  Pt appears to be near baseline.  No further PT needs.         If plan is discharge home, recommend the following:     Can travel by private vehicle        Equipment Recommendations None recommended by PT  Recommendations for Other Services       Functional Status Assessment Patient has not had a recent decline in their functional status     Precautions / Restrictions Precautions Precautions: None      Mobility  Bed Mobility Overal bed mobility: Independent                  Transfers Overall transfer level: Independent                 General transfer comment: Demonstrated bed mobility and STS without dfficulty    Ambulation/Gait Ambulation/Gait assistance: Independent Gait Distance (Feet): 80 Feet Assistive device: None Gait Pattern/deviations: WFL(Within Functional Limits) Gait velocity: normal     General Gait Details: Ambulating in room, multiple laps, navigating around objects, managing O2 line.  Reports she has been mobilizing on her own.  Demonstrated safely with therapy  Stairs            Wheelchair Mobility     Tilt Bed    Modified Rankin (Stroke Patients Only)        Balance Overall balance assessment: Independent Sitting-balance support: No upper extremity supported Sitting balance-Leahy Scale: Normal     Standing balance support: No upper extremity supported Standing balance-Leahy Scale: Good                               Pertinent Vitals/Pain Pain Assessment Pain Assessment: 0-10 Pain Score: 7  Pain Location: L chest lung pain Pain Descriptors / Indicators: Pressure Pain Intervention(s): Limited activity within patient's tolerance, Repositioned, Relaxation, Monitored during session, Other (comment) (notified RN)    Home Living Family/patient expects to be discharged to:: Private residence Living Arrangements: Children Available Help at Discharge: Family;Available 24 hours/day Type of Home: House Home Access: Stairs to enter Entrance Stairs-Rails: Right Entrance Stairs-Number of Steps: 3   Home Layout: One level Home Equipment: Cane - single point;Rollator (4 wheels);Shower seat;Grab bars - tub/shower Additional Comments: On 2 L O2 at home 24/7    Prior Function               Mobility Comments: In home ambulated wtihout AD; in community would use AD; denies falls ADLs Comments: Ind with ADLs/selfcare, IADLs, home mgt, cooking, no longer drives bc she does not have a car; can go out in community but typically family does shopping  Extremity/Trunk Assessment   Upper Extremity Assessment Upper Extremity Assessment: Overall WFL for tasks assessed    Lower Extremity Assessment Lower Extremity Assessment: Overall WFL for tasks assessed    Cervical / Trunk Assessment Cervical / Trunk Assessment: Normal  Communication      Cognition Arousal: Alert Behavior During Therapy: WFL for tasks assessed/performed Overall Cognitive Status: Within Functional Limits for tasks assessed                                          General Comments General comments (skin integrity, edema, etc.): Pt on 2  L O2 with satas 93% rest.  Did drop to 89% ambulation but improving to 93% in 1 min.  Pt did c/o constant chest pressure that occurred after return to bed-states lung pain , has had thorughout admission    Exercises     Assessment/Plan    PT Assessment Patient does not need any further PT services  PT Problem List         PT Treatment Interventions      PT Goals (Current goals can be found in the Care Plan section)  Acute Rehab PT Goals Patient Stated Goal: return home PT Goal Formulation: All assessment and education complete, DC therapy    Frequency       Co-evaluation               AM-PAC PT 6 Clicks Mobility  Outcome Measure Help needed turning from your back to your side while in a flat bed without using bedrails?: None Help needed moving from lying on your back to sitting on the side of a flat bed without using bedrails?: None Help needed moving to and from a bed to a chair (including a wheelchair)?: None Help needed standing up from a chair using your arms (e.g., wheelchair or bedside chair)?: None Help needed to walk in hospital room?: None Help needed climbing 3-5 steps with a railing? : None 6 Click Score: 24    End of Session Equipment Utilized During Treatment: Oxygen  Activity Tolerance: Patient tolerated treatment well Patient left: in bed;with call bell/phone within reach Nurse Communication: Mobility status PT Visit Diagnosis: Other abnormalities of gait and mobility (R26.89)    Time: 1413-1430 PT Time Calculation (min) (ACUTE ONLY): 17 min   Charges:   PT Evaluation $PT Eval Low Complexity: 1 Low   PT General Charges $$ ACUTE PT VISIT: 1 Visit         Tracy, PT Acute Rehab Services [facility] [phone]   Tracy Park 09/23/2023, 2:39 PM

## 2023-09-23 NOTE — Progress Notes (Addendum)
 Initial Nutrition Assessment  DOCUMENTATION CODES:   Severe malnutrition in context of chronic illness  INTERVENTION:  -Increase Ensure Enlive TID -Encouraged small, frequent meals  -B complex with vitamin C daily  NUTRITION DIAGNOSIS:   Severe Malnutrition related to chronic illness (COPD) as evidenced by severe muscle depletion, moderate fat depletion, energy intake < 75% for > or equal to 1 month.  GOAL:   Patient will meet greater than or equal to 90% of their needs  MONITOR:   PO intake, Weight trends  REASON FOR ASSESSMENT:   Consult Assessment of nutrition requirement/status  ASSESSMENT:   Pt with PMH of COPD with 2 L O2 nasal cannula at all times, remote lung cancer s/p left upper lobe resection over 5 years ago, atrial fibrillation, HTN, anxiety, and depression. Pt presented to ED for wheezing and SOB related to COPD exacerbation.   Pt ate 100% of breakfast this morning. Pt reports occasionally cooking for herself. She reports eating two full meals a day and grazing throughout the day. She tries to drink two chocolate Ensure's daily. Her breakfast consists of cereal, juice, and a chocolate Ensure. Her dinner is usually a meat, a starch, and a vegetable. She reports trying to follow a diabetic diet and no salt diet. Pt reports her usual body weight being 96-100 lbs with no recent weight loss. She reports having trouble swallowing occasionally. Has history of esophageal dilation. MD present at bedside and was alerted to swallowing difficulty. Denies any chewing, swallowing difficulty.   During NFPE pt reported having difficulty with mobility at home due to SOB.   Labs: Glucose 114, A1C 5.7  Meds: magnesium  oxide 200 mg, oxygen  2 L/min continuous, Ensure BID   NUTRITION - FOCUSED PHYSICAL EXAM:  Flowsheet Row Most Recent Value  Orbital Region Mild depletion  Upper Arm Region Moderate depletion  [left arm weakness from reconstructive surgery]  Thoracic and  Lumbar Region Mild depletion  Buccal Region Moderate depletion  Temple Region Moderate depletion  Clavicle Bone Region Moderate depletion  Clavicle and Acromion Bone Region Moderate depletion  Scapular Bone Region Moderate depletion  Dorsal Hand Moderate depletion  Patellar Region Severe depletion  Anterior Thigh Region Severe depletion  Posterior Calf Region Severe depletion  Edema (RD Assessment) None  Hair Reviewed  Eyes Reviewed  [blindness in left eye]  Mouth Reviewed  Skin Reviewed  Nails Reviewed       Diet Order:   Diet Order             Diet regular Room service appropriate? Yes; Fluid consistency: Thin  Diet effective now                   EDUCATION NEEDS:   Education needs have been addressed  Skin:  Skin Assessment: Reviewed RN Assessment  Last BM:  1/13  Height:   Ht Readings from Last 1 Encounters:  09/23/23 5' 3 (1.6 m)    Weight:   Wt Readings from Last 1 Encounters:  09/23/23 46 kg    Ideal Body Weight:   116 lbs  BMI:  Body mass index is 17.96 kg/m.  Estimated Nutritional Needs:   Kcal:  1500-1700  Protein:  75-85 g  Fluid:  >1.5 L    Tinnie Ferraris, MS Dietetic Intern  I have reviewed the dietetic intern's note and agree with the findings and interventions.  Morna Lee, MS, RD, LDN Inpatient Clinical Dietitian Contact via Secure chat

## 2023-09-23 NOTE — Progress Notes (Addendum)
 PROGRESS NOTE  [name]:[id number] DOB: 1956-01-30 DOA: 09/22/2023 PCP: Donelda Evalene PARAS, PA-C   LOS: 1 day   Brief narrative:  Tracy Park is a 68 y.o. female with medical history with past medical history of advanced COPD on 2 L of oxygen  at all times, paroxysmal atrial fibrillation on Eliquis , hypothyroidism, hypertension, anxiety and depression, history of lung cancer status post upper lobe resection more than 5 years ago currently in remission presented to the hospital s with increasing shortness of breath and wheezing.  Patient was seen in the ED a week prior to this presentation and given 5-day course of prednisone  which she finished 2 days prior to presentation but then continued to have wheezing and shortness of breath so she came to the ED.The patient has a nebulizer machine at home.  Patient was then brought into the ED.  In the ED, patient was noted to be tachypneic and was 96% on room air.  Labs reviewed within normal range.  COVID influenza and RSV was negative.  Chest x-ray showed left apical scarring with volume loss.  Patient received Solu-Medrol  1 hour continuous neb and despite that she remained wheezy so medical team was consulted for admission to hospital.    Assessment and Plan:   Acute COPD exacerbation -continue nebulizer, IV steroid, antibiotic with IV Rocephin  and doxycycline .  On 2 L of oxygen  at baseline.  Still with dyspnea on exertion with significant wheezes.  States that she is not done much better today. Follows up with Dr Sammuel, [facility].   Paroxysmal atrial fibrillation.-continue Eliquis .  resume metoprolol .  Rate controlled at this time.   History of left upper lobe lobectomy and lung cancer greater than 5 years ago.  Now in remission.  Her last CT was done less than 2 months ago during her last hospitalization.  Continue oxygen  nebulizers and steroids.   Hypothyroidism - continue Synthroid   Hypertension -patient is on losartan  and  metoprolol  at home blood pressure is borderline low so we will hold with losartan .  Resume metoprolol .  History of hyperlipidemia.  On Crestor .  Will continue.  History of anxiety depression.  On Zoloft .  Will continue.   DVT prophylaxis:  apixaban  (ELIQUIS ) tablet 5 mg   Disposition: Home likely in 1- 2 days, pending clinical improvement. Will get PT evaluation.  Status is: Inpatient  Remains inpatient appropriate because: pending clinical improvement, wheezing, IV antibiotics, IV steroids.    Code Status:     Code Status: Limited: Do not attempt resuscitation (DNR) -DNR-LIMITED -Do Not Intubate/DNI   Family Communication: none at bedside.   Consultants: None   Procedures: None   Anti-infectives:  Rocephin  and doxycycline   Anti-infectives (From admission, onward)    Start     Dose/Rate Route Frequency Ordered Stop   09/22/23 2245  cefTRIAXone  (ROCEPHIN ) 1 g in sodium chloride  0.9 % 100 mL IVPB        1 g 200 mL/hr over 30 Minutes Intravenous Daily at bedtime 09/22/23 2227     09/22/23 2245  doxycycline  (VIBRA -TABS) tablet 100 mg        100 mg Oral 2 times daily 09/22/23 2227        Subjective: Today, patient was seen and examined at bedside. Still complains of wheezing and shortness of breath at rest and especially on exertion.  Does not feel much better than yesterday.  Complains of dry cough++  Objective: Vitals:   09/23/23 0920 09/23/23 0924  BP:    Pulse:  Resp:    Temp:    SpO2: 98% 98%   No intake or output data in the 24 hours ending 09/23/23 1004 Filed Weights   09/23/23 0907  Weight: 46 kg   Body mass index is 17.96 kg/m.   Physical Exam:  GENERAL: Patient is alert awake and oriented. Not in obvious distress. Thinly built. HENT: No scleral pallor or icterus. Pupils equally reactive to light. Oral mucosa is moist NECK: is supple, no gross swelling noted. CHEST: Diminished breath sounds bilaterally. Wheezes ++ CVS: S1 and S2 heard, no  murmur. Regular rate and rhythm.  ABDOMEN: Soft, non-tender, bowel sounds are present. EXTREMITIES: No edema. CNS: Cranial nerves are intact. No focal motor deficits. SKIN: warm and dry without rashes.  Data Review: I have personally reviewed the following laboratory data and studies,  CBC: Recent Labs  Lab 09/22/23 1809 09/23/23 0656  WBC 9.9 7.3  HGB 11.6* 12.3  HCT 37.8 39.6  MCV 95.0 94.3  PLT 200 225   Basic Metabolic Panel: Recent Labs  Lab 09/22/23 1809 09/23/23 0656  NA 140 137  K 3.6 3.9  CL 101 100  CO2 30 29  GLUCOSE 136* 114*  BUN 14 18  CREATININE 0.50 0.44  CALCIUM  8.8* 8.6*  MG  --  2.1   Liver Function Tests: No results for input(s): AST, ALT, ALKPHOS, BILITOT, PROT, ALBUMIN in the last 168 hours. No results for input(s): LIPASE, AMYLASE in the last 168 hours. No results for input(s): AMMONIA in the last 168 hours. Cardiac Enzymes: No results for input(s): CKTOTAL, CKMB, CKMBINDEX, TROPONINI in the last 168 hours. BNP (last 3 results) Recent Labs    03/06/23 1938 04/16/23 2048 08/03/23 1723  BNP 54.6 114.4* 68.2    ProBNP (last 3 results) No results for input(s): PROBNP in the last 8760 hours.  CBG: No results for input(s): GLUCAP in the last 168 hours. Recent Results (from the past 240 hours)  Resp panel by RT-PCR (RSV, Flu A&B, Covid) Anterior Nasal Swab     Status: None   Collection Time: 09/22/23  9:28 PM   Specimen: Anterior Nasal Swab  Result Value Ref Range Status   SARS Coronavirus 2 by RT PCR NEGATIVE NEGATIVE Final    Comment: (NOTE) SARS-CoV-2 target nucleic acids are NOT DETECTED.  The SARS-CoV-2 RNA is generally detectable in upper respiratory specimens during the acute phase of infection. The lowest concentration of SARS-CoV-2 viral copies this assay can detect is 138 copies/mL. A negative result does not preclude SARS-Cov-2 infection and should not be used as the sole basis for treatment  or other patient management decisions. A negative result may occur with  improper specimen collection/handling, submission of specimen other than nasopharyngeal swab, presence of viral mutation(s) within the areas targeted by this assay, and inadequate number of viral copies(<138 copies/mL). A negative result must be combined with clinical observations, patient history, and epidemiological information. The expected result is Negative.  Fact Sheet for Patients:  bloggercourse.com  Fact Sheet for Healthcare Providers:  seriousbroker.it  This test is no t yet approved or cleared by the [place]  FDA and  has been authorized for detection and/or diagnosis of SARS-CoV-2 by FDA under an Emergency Use Authorization (EUA). This EUA will remain  in effect (meaning this test can be used) for the duration of the COVID-19 declaration under Section 564(b)(1) of the Act, 21 U.S.C.section 360bbb-3(b)(1), unless the authorization is terminated  or revoked sooner.       Influenza A  by PCR NEGATIVE NEGATIVE Final   Influenza B by PCR NEGATIVE NEGATIVE Final    Comment: (NOTE) The Xpert Xpress SARS-CoV-2/FLU/RSV plus assay is intended as an aid in the diagnosis of influenza from Nasopharyngeal swab specimens and should not be used as a sole basis for treatment. Nasal washings and aspirates are unacceptable for Xpert Xpress SARS-CoV-2/FLU/RSV testing.  Fact Sheet for Patients: bloggercourse.com  Fact Sheet for Healthcare Providers: seriousbroker.it  This test is not yet approved or cleared by the [place]  FDA and has been authorized for detection and/or diagnosis of SARS-CoV-2 by FDA under an Emergency Use Authorization (EUA). This EUA will remain in effect (meaning this test can be used) for the duration of the COVID-19 declaration under Section 564(b)(1) of the Act, 21 U.S.C. section  360bbb-3(b)(1), unless the authorization is terminated or revoked.     Resp Syncytial Virus by PCR NEGATIVE NEGATIVE Final    Comment: (NOTE) Fact Sheet for Patients: bloggercourse.com  Fact Sheet for Healthcare Providers: seriousbroker.it  This test is not yet approved or cleared by the [place]  FDA and has been authorized for detection and/or diagnosis of SARS-CoV-2 by FDA under an Emergency Use Authorization (EUA). This EUA will remain in effect (meaning this test can be used) for the duration of the COVID-19 declaration under Section 564(b)(1) of the Act, 21 U.S.C. section 360bbb-3(b)(1), unless the authorization is terminated or revoked.  Performed at [facility], [address], [place], [place] [address]      Studies: DG Chest 2 View Result Date: 09/22/2023 CLINICAL DATA:  Shortness of breath EXAM: CHEST - 2 VIEW COMPARISON:  09/11/2023 FINDINGS: Left apical scarring with volume loss and upper hilar retraction. Lungs are otherwise clear. Chronic blunting of the left costophrenic angle without definite pleural effusion. No pneumothorax. Heart is normal in size. Visualized osseous structures are within normal limits. IMPRESSION: Left apical scarring with volume loss, as above. No acute cardiopulmonary disease. Electronically Signed   By: Pinkie Pebbles M.D.   On: 09/22/2023 19:23      Vernal Alstrom, MD  [facility] 09/23/2023  If 7PM-7AM, please contact night-coverage

## 2023-09-23 NOTE — ED Notes (Signed)
Assisted patient to and from restroom.

## 2023-09-23 NOTE — Plan of Care (Signed)

## 2023-09-23 NOTE — Hospital Course (Addendum)
 Tracy Park is a 68 y.o. female with PMH advanced COPD on 2 L O2, PAF on Eliquis , hypothyroidism, hypertension, anxiety and depression, history of lung cancer status post upper lobe resection more than 5 years ago currently in remission presented to the hospital with increasing shortness of breath and wheezing.  Patient was seen in the ED a week prior to this presentation and given 5-day course of prednisone  which she finished 2 days prior to presentation but then continued to have wheezing and shortness of breath so she came to the ED.SABRA She has continued to smoke prior to hospitalization as well. She was admitted for COPD exacerbation treatment.    Assessment and Plan:   Acute COPD exacerbation  - continue steroids and nebs; prednisone  taper continued at discharge  - completed doxy course - morphine  PRN for WOB   Paroxysmal atrial fibrillation.-continue Eliquis .  Continue metoprolol .  Rate controlled at this time.   History of left upper lobe lobectomy and lung cancer greater than 5 years ago.  Now in remission.  Her last CT was done less than 2 months ago during her last hospitalization.  Continue oxygen  nebulizers and steroids.   Hypothyroidism - continue Synthroid   Hypertension -patient is on losartan  and metoprolol  at home blood pressure is borderline low so we will hold with losartan .  Resume metoprolol .  History of hyperlipidemia.  On Crestor .   History of anxiety depression.  On Zoloft .

## 2023-09-24 DIAGNOSIS — J441 Chronic obstructive pulmonary disease with (acute) exacerbation: Secondary | ICD-10-CM | POA: Diagnosis not present

## 2023-09-24 LAB — CBC
HCT: 35.3 % — ABNORMAL LOW (ref 36.0–46.0)
Hemoglobin: 11 g/dL — ABNORMAL LOW (ref 12.0–15.0)
MCH: 29.7 pg (ref 26.0–34.0)
MCHC: 31.2 g/dL (ref 30.0–36.0)
MCV: 95.4 fL (ref 80.0–100.0)
Platelets: 220 10*3/uL (ref 150–400)
RBC: 3.7 MIL/uL — ABNORMAL LOW (ref 3.87–5.11)
RDW: 12.6 % (ref 11.5–15.5)
WBC: 15.4 10*3/uL — ABNORMAL HIGH (ref 4.0–10.5)
nRBC: 0 % (ref 0.0–0.2)

## 2023-09-24 LAB — BASIC METABOLIC PANEL
Anion gap: 7 (ref 5–15)
BUN: 25 mg/dL — ABNORMAL HIGH (ref 8–23)
CO2: 29 mmol/L (ref 22–32)
Calcium: 8.7 mg/dL — ABNORMAL LOW (ref 8.9–10.3)
Chloride: 102 mmol/L (ref 98–111)
Creatinine, Ser: 0.76 mg/dL (ref 0.44–1.00)
GFR, Estimated: >60 mL/min (ref >60)
Glucose, Bld: 137 mg/dL — ABNORMAL HIGH (ref 70–99)
Potassium: 4.2 mmol/L (ref 3.5–5.1)
Sodium: 138 mmol/L (ref 135–145)

## 2023-09-24 LAB — MAGNESIUM: Magnesium: 1.9 mg/dL (ref 1.7–2.4)

## 2023-09-24 MED ORDER — IPRATROPIUM-ALBUTEROL 0.5-2.5 (3) MG/3ML IN SOLN
3.0000 mL | Freq: Four times a day (QID) | RESPIRATORY_TRACT | Status: DC
Start: 2023-09-24 — End: 2023-09-27
  Administered 2023-09-24 – 2023-09-27 (×12): 3 mL via RESPIRATORY_TRACT
  Filled 2023-09-24 (×12): qty 3

## 2023-09-24 MED ORDER — MORPHINE SULFATE (PF) 2 MG/ML IV SOLN
2.0000 mg | INTRAVENOUS | Status: DC | PRN
  Administered 2023-09-24 – 2023-09-27 (×5): 2 mg via INTRAVENOUS
  Filled 2023-09-24 (×5): qty 1

## 2023-09-24 MED ORDER — ARFORMOTEROL TARTRATE 15 MCG/2ML IN NEBU
15.0000 ug | INHALATION_SOLUTION | Freq: Two times a day (BID) | RESPIRATORY_TRACT | Status: DC
  Administered 2023-09-24 – 2023-09-28 (×9): 15 ug via RESPIRATORY_TRACT
  Filled 2023-09-24 (×9): qty 2

## 2023-09-24 NOTE — Progress Notes (Signed)
 Progress Note    Tracy Park   ZOX:[id number]  DOB: 1956/04/06  DOA: 09/22/2023     2 PCP: Mordecai Applebaum, PA-C  Initial CC: SOB  Hospital Course: Tracy Park is a 68 y.o. female with PMH advanced COPD on 2 L O2, PAF on Eliquis , hypothyroidism, hypertension, anxiety and depression, history of lung cancer status post upper lobe resection more than 5 years ago currently in remission presented to the hospital with increasing shortness of breath and wheezing.  Patient was seen in the ED a week prior to this presentation and given 5-day course of prednisone  which she finished 2 days prior to presentation but then continued to have wheezing and shortness of breath so she came to the ED.Aaron Aas She has continued to smoke prior to hospitalization as well. She was admitted for COPD exacerbation treatment.    Assessment and Plan:   Acute COPD exacerbation  - continue steroids and nebs - continue doxy - morphine  PRN for WOB   Paroxysmal atrial fibrillation.-continue Eliquis .  resume metoprolol .  Rate controlled at this time.   History of left upper lobe lobectomy and lung cancer greater than 5 years ago.  Now in remission.  Her last CT was done less than 2 months ago during her last hospitalization.  Continue oxygen  nebulizers and steroids.   Hypothyroidism - continue Synthroid   Hypertension -patient is on losartan  and metoprolol  at home blood pressure is borderline low so we will hold with losartan .  Resume metoprolol .  History of hyperlipidemia.  On Crestor .   History of anxiety depression.  On Zoloft .   Interval History:  Still very SOB and having wheezing today.    Old records reviewed in assessment of this patient  Antimicrobials: Rocephin  1/13 >> current   DVT prophylaxis:   apixaban  (ELIQUIS ) tablet 5 mg   Code Status:   Code Status: Limited: Do not attempt resuscitation (DNR) -DNR-LIMITED -Do Not Intubate/DNI   Mobility Assessment (Last 72 Hours)     Mobility  Assessment     Row Name 09/24/23 0921 09/23/23 1915 09/23/23 1437 09/23/23 1000 09/23/23 0900   Does patient have an order for bedrest or is patient medically unstable No - Continue assessment No - Continue assessment -- No - Continue assessment No - Continue assessment   What is the highest level of mobility based on the progressive mobility assessment? Level 6 (Walks independently in room and hall) - Balance while walking in room without assist - Complete Level 6 (Walks independently in room and hall) - Balance while walking in room without assist - Complete Level 6 (Walks independently in room and hall) - Balance while walking in room without assist - Complete Level 5 (Walks with assist in room/hall) - Balance while stepping forward/back and can walk in room with assist - Complete --            Barriers to discharge:  Disposition Plan:  Home HH orders placed:  Status is: Inpt  Objective: Blood pressure 105/67, pulse 82, temperature (!) 97.3 F (36.3 C), temperature source Oral, resp. rate 20, height 5\' 3"  (1.6 m), weight 46 kg, SpO2 97%.  Examination:  Physical Exam Constitutional:      Appearance: Normal appearance.  HENT:     Head: Normocephalic and atraumatic.     Mouth/Throat:     Mouth: Mucous membranes are moist.  Eyes:     Extraocular Movements: Extraocular movements intact.  Cardiovascular:     Rate and Rhythm: Normal rate and regular  rhythm.  Pulmonary:     Effort: Pulmonary effort is normal. No respiratory distress.     Breath sounds: Wheezing present.     Comments: Tight and coarse breath sounds bilaterally  Abdominal:     General: Bowel sounds are normal. There is no distension.     Palpations: Abdomen is soft.     Tenderness: There is no abdominal tenderness.  Musculoskeletal:        General: Normal range of motion.     Cervical back: Normal range of motion and neck supple.  Skin:    General: Skin is warm and dry.  Neurological:     General: No focal  deficit present.     Mental Status: She is alert.  Psychiatric:        Mood and Affect: Mood normal.      Consultants:    Procedures:    Data Reviewed: Results for orders placed or performed during the hospital encounter of 09/22/23 (from the past 24 hours)  CBC     Status: Abnormal   Collection Time: 09/24/23  4:43 AM  Result Value Ref Range   WBC 15.4 (H) 4.0 - 10.5 K/uL   RBC 3.70 (L) 3.87 - 5.11 MIL/uL   Hemoglobin 11.0 (L) 12.0 - 15.0 g/dL   HCT 09.8 (L) 11.9 - 14.7 %   MCV 95.4 80.0 - 100.0 fL   MCH 29.7 26.0 - 34.0 pg   MCHC 31.2 30.0 - 36.0 g/dL   RDW 82.9 56.2 - 13.0 %   Platelets 220 150 - 400 K/uL   nRBC 0.0 0.0 - 0.2 %  Basic metabolic panel     Status: Abnormal   Collection Time: 09/24/23  4:43 AM  Result Value Ref Range   Sodium 138 135 - 145 mmol/L   Potassium 4.2 3.5 - 5.1 mmol/L   Chloride 102 98 - 111 mmol/L   CO2 29 22 - 32 mmol/L   Glucose, Bld 137 (H) 70 - 99 mg/dL   BUN 25 (H) 8 - 23 mg/dL   Creatinine, Ser 8.65 0.44 - 1.00 mg/dL   Calcium  8.7 (L) 8.9 - 10.3 mg/dL   GFR, Estimated >78 >46 mL/min   Anion gap 7 5 - 15  Magnesium      Status: None   Collection Time: 09/24/23  4:43 AM  Result Value Ref Range   Magnesium  1.9 1.7 - 2.4 mg/dL    I have reviewed pertinent nursing notes, vitals, labs, and images as necessary. I have ordered labwork to follow up on as indicated.  I have reviewed the last notes from staff over past 24 hours. I have discussed patient's care plan and test results with nursing staff, CM/SW, and other staff as appropriate.  Time spent: Greater than 50% of the 55 minute visit was spent in counseling/coordination of care for the patient as laid out in the A&P.   LOS: 2 days   Brody Homes, MD [facility] 09/24/2023, 3:22 PM

## 2023-09-24 NOTE — Progress Notes (Signed)
 OT Cancellation Note  Patient Details Name: Tracy Park MRN: [id number] DOB: December 14, 1955   Cancelled Treatment:    Reason Eval/Treat Not Completed: OT screened, no needs identified, will sign off Patient reported that she is independent in these tasks. Patient also plesantly indicated that "this is not my first rodeo". No skilled OT needs identified. OT to sign off [name], MS Acute Rehabilitation Department Office# [phone]  09/24/2023, 8:26 AM

## 2023-09-24 NOTE — Plan of Care (Signed)

## 2023-09-24 NOTE — Progress Notes (Signed)
   09/24/23 1408  TOC Brief Assessment  Insurance and Status Reviewed  Patient has primary care physician Yes  Home environment has been reviewed Single family home  Prior level of function: Independent/modified independent  Prior/Current Home Services No current home services  Social Drivers of Health Review SDOH reviewed no interventions necessary  Readmission risk has been reviewed Yes  Transition of care needs no transition of care needs at this time

## 2023-09-25 DIAGNOSIS — J441 Chronic obstructive pulmonary disease with (acute) exacerbation: Secondary | ICD-10-CM | POA: Diagnosis not present

## 2023-09-25 NOTE — Plan of Care (Signed)

## 2023-09-25 NOTE — Progress Notes (Signed)
Progress Note    Tracy Park   YQM:[id number]  DOB: 16-Apr-1956  DOA: 09/22/2023     3 PCP: Jacquelin Hawking, PA-C  Initial CC: SOB  Hospital Course: [name] is a 68 y.o. female with PMH advanced COPD on 2 L O2, PAF on Eliquis, hypothyroidism, hypertension, anxiety and depression, history of lung cancer status post upper lobe resection more than 5 years ago currently in remission presented to the hospital with increasing shortness of breath and wheezing.  Patient was seen in the ED a week prior to this presentation and given 5-day course of prednisone which she finished 2 days prior to presentation but then continued to have wheezing and shortness of breath so she came to the ED.[name] She has continued to smoke prior to hospitalization as well. She was admitted for COPD exacerbation treatment.    Assessment and Plan:   Acute COPD exacerbation  - continue steroids and nebs - continue doxy - morphine PRN for WOB   Paroxysmal atrial fibrillation.-continue Eliquis.  resume metoprolol.  Rate controlled at this time.   History of left upper lobe lobectomy and lung cancer greater than 5 years ago.  Now in remission.  Her last CT was done less than 2 months ago during her last hospitalization.  Continue oxygen nebulizers and steroids.   Hypothyroidism - continue Synthroid  Hypertension -patient is on losartan and metoprolol at home blood pressure is borderline low so we will hold with losartan.  Resume metoprolol.  History of hyperlipidemia.  On Crestor.   History of anxiety depression.  On Zoloft.   Interval History:  Appears at least a little more comfortable today and breathing is easier. Still has quite a bit of wheezing but breath sounds are less tight.    Old records reviewed in assessment of this patient  Antimicrobials: Rocephin 1/13 >> current   DVT prophylaxis:   apixaban (ELIQUIS) tablet 5 mg   Code Status:   Code Status: Limited: Do not attempt  resuscitation (DNR) -DNR-LIMITED -Do Not Intubate/DNI   Mobility Assessment (Last 72 Hours)     Mobility Assessment     Row Name 09/25/23 0736 09/24/23 1917 09/24/23 0921 09/23/23 1915 09/23/23 1437   Does patient have an order for bedrest or is patient medically unstable No - Continue assessment No - Continue assessment No - Continue assessment No - Continue assessment --   What is the highest level of mobility based on the progressive mobility assessment? Level 6 (Walks independently in room and hall) - Balance while walking in room without assist - Complete Level 6 (Walks independently in room and hall) - Balance while walking in room without assist - Complete Level 6 (Walks independently in room and hall) - Balance while walking in room without assist - Complete Level 6 (Walks independently in room and hall) - Balance while walking in room without assist - Complete Level 6 (Walks independently in room and hall) - Balance while walking in room without assist - Complete    Row Name 09/23/23 1000 09/23/23 0900         Does patient have an order for bedrest or is patient medically unstable No - Continue assessment No - Continue assessment      What is the highest level of mobility based on the progressive mobility assessment? Level 5 (Walks with assist in room/hall) - Balance while stepping forward/back and can walk in room with assist - Complete --  Barriers to discharge:  Disposition Plan:  Home HH orders placed:  Status is: Inpt  Objective: Blood pressure 103/63, pulse 84, temperature 98.1 F (36.7 C), temperature source Oral, resp. rate 19, height 5\' 3"  (1.6 m), weight 46 kg, SpO2 97%.  Examination:  Physical Exam Constitutional:      Appearance: Normal appearance.  HENT:     Head: Normocephalic and atraumatic.     Mouth/Throat:     Mouth: Mucous membranes are moist.  Eyes:     Extraocular Movements: Extraocular movements intact.  Cardiovascular:     Rate  and Rhythm: Normal rate and regular rhythm.  Pulmonary:     Effort: Pulmonary effort is normal. No respiratory distress.     Breath sounds: Wheezing present.     Comments: Tight and coarse breath sounds bilaterally (overall some improvement) Abdominal:     General: Bowel sounds are normal. There is no distension.     Palpations: Abdomen is soft.     Tenderness: There is no abdominal tenderness.  Musculoskeletal:        General: Normal range of motion.     Cervical back: Normal range of motion and neck supple.  Skin:    General: Skin is warm and dry.  Neurological:     General: No focal deficit present.     Mental Status: She is alert.  Psychiatric:        Mood and Affect: Mood normal.      Consultants:    Procedures:    Data Reviewed: No results found for this or any previous visit (from the past 24 hours).   I have reviewed pertinent nursing notes, vitals, labs, and images as necessary. I have ordered labwork to follow up on as indicated.  I have reviewed the last notes from staff over past 24 hours. I have discussed patient's care plan and test results with nursing staff, CM/SW, and other staff as appropriate.  Time spent: Greater than 50% of the 55 minute visit was spent in counseling/coordination of care for the patient as laid out in the A&P.   LOS: 3 days   [name], MD [facility] 09/25/2023, 12:43 PM

## 2023-09-25 NOTE — Progress Notes (Signed)
Mobility Specialist - Progress Note   09/25/23 1501  Oxygen Therapy  O2 Device Nasal Cannula  O2 Flow Rate (L/min) 2 L/min  Mobility  Activity Ambulated independently in hallway  Level of Assistance Independent  Assistive Device None  Distance Ambulated (ft) 200 ft  Activity Response Tolerated well  Mobility Referral Yes  Mobility visit 1 Mobility  Mobility Specialist Start Time (ACUTE ONLY) 1450  Mobility Specialist Stop Time (ACUTE ONLY) 1457  Mobility Specialist Time Calculation (min) (ACUTE ONLY) 7 min   Pt received in bed and agreeable to mobility. Pt took x1 standing rest break d/t SOB. No other complaints during session. Pt to bed after session with all needs met.    [facility]

## 2023-09-26 DIAGNOSIS — J441 Chronic obstructive pulmonary disease with (acute) exacerbation: Secondary | ICD-10-CM | POA: Diagnosis not present

## 2023-09-26 NOTE — Progress Notes (Signed)
Mobility Specialist - Progress Note  ( 2L [place]) Pre-mobility: 100 bpm HR, 96% SpO2 During mobility: 102 bpm HR, 92% SpO2 Post-mobility: 99 bpm HR, 93% SPO2   09/26/23 1112  Mobility  Activity Ambulated independently in hallway  Level of Assistance Independent after set-up  Assistive Device None  Distance Ambulated (ft) 200 ft  Range of Motion/Exercises Active  Activity Response Tolerated well  Mobility Referral Yes  Mobility visit 1 Mobility  Mobility Specialist Start Time (ACUTE ONLY) 1100  Mobility Specialist Stop Time (ACUTE ONLY) 1112  Mobility Specialist Time Calculation (min) (ACUTE ONLY) 12 min   Pt was found in bed and agreeable to ambulate. C/o coughing and wheezing throughout session. At EOS returned to bed with all needs met. Call bell in reach.  Billey Chang Mobility Specialist

## 2023-09-26 NOTE — Progress Notes (Signed)
Progress Note    Tracy Park   WGN:[id number]  DOB: 1956/04/01  DOA: 09/22/2023     4 PCP: Jacquelin Hawking, PA-C  Initial CC: SOB  Hospital Course: Tracy Park is a 68 y.o. female with PMH advanced COPD on 2 L O2, PAF on Eliquis, hypothyroidism, hypertension, anxiety and depression, history of lung cancer status post upper lobe resection more than 5 years ago currently in remission presented to the hospital with increasing shortness of breath and wheezing.  Patient was seen in the ED a week prior to this presentation and given 5-day course of prednisone which she finished 2 days prior to presentation but then continued to have wheezing and shortness of breath so she came to the ED.[name] She has continued to smoke prior to hospitalization as well. She was admitted for COPD exacerbation treatment.    Assessment and Plan:   Acute COPD exacerbation  - continue steroids and nebs - continue doxy - morphine PRN for WOB   Paroxysmal atrial fibrillation.-continue Eliquis.  resume metoprolol.  Rate controlled at this time.   History of left upper lobe lobectomy and lung cancer greater than 5 years ago.  Now in remission.  Her last CT was done less than 2 months ago during her last hospitalization.  Continue oxygen nebulizers and steroids.   Hypothyroidism - continue Synthroid  Hypertension -patient is on losartan and metoprolol at home blood pressure is borderline low so we will hold with losartan.  Resume metoprolol.  History of hyperlipidemia.  On Crestor.   History of anxiety depression.  On Zoloft.   Interval History:  Still has tight breath sounds but she seems to be slowly improving.  Morphine does help for the pleuritic pains and work of breathing when it gets bad.   Old records reviewed in assessment of this patient  Antimicrobials: Rocephin 1/13 >> current   DVT prophylaxis:   apixaban (ELIQUIS) tablet 5 mg   Code Status:   Code Status: Limited: Do not attempt  resuscitation (DNR) -DNR-LIMITED -Do Not Intubate/DNI   Mobility Assessment (Last 72 Hours)     Mobility Assessment     Row Name 09/26/23 0850 09/25/23 2000 09/25/23 0736 09/24/23 1917 09/24/23 0921   Does patient have an order for bedrest or is patient medically unstable No - Continue assessment No - Continue assessment No - Continue assessment No - Continue assessment No - Continue assessment   What is the highest level of mobility based on the progressive mobility assessment? Level 5 (Walks with assist in room/hall) - Balance while stepping forward/back and can walk in room with assist - Complete Level 6 (Walks independently in room and hall) - Balance while walking in room without assist - Complete Level 6 (Walks independently in room and hall) - Balance while walking in room without assist - Complete Level 6 (Walks independently in room and hall) - Balance while walking in room without assist - Complete Level 6 (Walks independently in room and hall) - Balance while walking in room without assist - Complete    Row Name 09/23/23 1915 09/23/23 1437         Does patient have an order for bedrest or is patient medically unstable No - Continue assessment --      What is the highest level of mobility based on the progressive mobility assessment? Level 6 (Walks independently in room and hall) - Balance while walking in room without assist - Complete Level 6 (Walks independently in room and  hall) - Balance while walking in room without assist - Complete               Barriers to discharge:  Disposition Plan:  Home HH orders placed:  Status is: Inpt  Objective: Blood pressure 108/74, pulse 84, temperature 98.2 F (36.8 C), temperature source Oral, resp. rate 18, height 5\' 3"  (1.6 m), weight 46 kg, SpO2 96%.  Examination:  Physical Exam Constitutional:      Appearance: Normal appearance.  HENT:     Head: Normocephalic and atraumatic.     Mouth/Throat:     Mouth: Mucous membranes are  moist.  Eyes:     Extraocular Movements: Extraocular movements intact.  Cardiovascular:     Rate and Rhythm: Normal rate and regular rhythm.  Pulmonary:     Effort: Pulmonary effort is normal. No respiratory distress.     Breath sounds: Wheezing present.     Comments: Tight and coarse breath sounds bilaterally (overall some improvement) Abdominal:     General: Bowel sounds are normal. There is no distension.     Palpations: Abdomen is soft.     Tenderness: There is no abdominal tenderness.  Musculoskeletal:        General: Normal range of motion.     Cervical back: Normal range of motion and neck supple.  Skin:    General: Skin is warm and dry.  Neurological:     General: No focal deficit present.     Mental Status: She is alert.  Psychiatric:        Mood and Affect: Mood normal.      Consultants:    Procedures:    Data Reviewed: No results found for this or any previous visit (from the past 24 hours).   I have reviewed pertinent nursing notes, vitals, labs, and images as necessary. I have ordered labwork to follow up on as indicated.  I have reviewed the last notes from staff over past 24 hours. I have discussed patient's care plan and test results with nursing staff, CM/SW, and other staff as appropriate.  Time spent: Greater than 50% of the 55 minute visit was spent in counseling/coordination of care for the patient as laid out in the A&P.   LOS: 4 days   [name], MD [facility] 09/26/2023, 2:10 PM

## 2023-09-27 DIAGNOSIS — J441 Chronic obstructive pulmonary disease with (acute) exacerbation: Secondary | ICD-10-CM | POA: Diagnosis not present

## 2023-09-27 MED ORDER — CYCLOBENZAPRINE HCL 10 MG PO TABS
10.0000 mg | ORAL_TABLET | Freq: Three times a day (TID) | ORAL | Status: DC
  Administered 2023-09-27 – 2023-09-28 (×3): 10 mg via ORAL
  Filled 2023-09-27 (×3): qty 1

## 2023-09-27 MED ORDER — IPRATROPIUM-ALBUTEROL 0.5-2.5 (3) MG/3ML IN SOLN
3.0000 mL | Freq: Four times a day (QID) | RESPIRATORY_TRACT | Status: DC
  Administered 2023-09-27 – 2023-09-28 (×4): 3 mL via RESPIRATORY_TRACT
  Filled 2023-09-27 (×4): qty 3

## 2023-09-27 NOTE — Plan of Care (Signed)
  Problem: Clinical Measurements: Goal: Respiratory complications will improve Outcome: Progressing   Problem: Coping: Goal: Level of anxiety will decrease Outcome: Progressing   Problem: Pain Management: Goal: General experience of comfort will improve Outcome: Progressing   Problem: Respiratory: Goal: Levels of oxygenation will improve Outcome: Progressing

## 2023-09-27 NOTE — Progress Notes (Signed)
Mobility Specialist - Progress Note  Pre-mobility: 94% SpO2 During mobility: 92% SpO2 Post-mobility: 93% SPO2   09/27/23 1132  Mobility  Activity Ambulated independently in hallway  Level of Assistance Independent after set-up  Assistive Device None  Distance Ambulated (ft) 200 ft  Range of Motion/Exercises Active  Activity Response Tolerated well  Mobility Referral Yes  Mobility visit 1 Mobility  Mobility Specialist Start Time (ACUTE ONLY) 1115  Mobility Specialist Stop Time (ACUTE ONLY) 1130  Mobility Specialist Time Calculation (min) (ACUTE ONLY) 15 min   Pt was found in bed and agreeable to ambulate. C/o of pain during session. At EOS returned to bed with all needs met. Call bell in reach.  Billey Chang Mobility Specialist

## 2023-09-27 NOTE — Progress Notes (Signed)
Progress Note    Tracy Park   ZOX:[id number]  DOB: 1956-02-25  DOA: 09/22/2023     5 PCP: Jacquelin Hawking, PA-C  Initial CC: SOB  Hospital Course: Tracy Park is a 68 y.o. female with PMH advanced COPD on 2 L O2, PAF on Eliquis, hypothyroidism, hypertension, anxiety and depression, history of lung cancer status post upper lobe resection more than 5 years ago currently in remission presented to the hospital with increasing shortness of breath and wheezing.  Patient was seen in the ED a week prior to this presentation and given 5-day course of prednisone which she finished 2 days prior to presentation but then continued to have wheezing and shortness of breath so she came to the ED.[name] She has continued to smoke prior to hospitalization as well. She was admitted for COPD exacerbation treatment.    Assessment and Plan:   Acute COPD exacerbation  - continue steroids and nebs - continue doxy - morphine PRN for WOB   Paroxysmal atrial fibrillation.-continue Eliquis.  Continue metoprolol.  Rate controlled at this time.   History of left upper lobe lobectomy and lung cancer greater than 5 years ago.  Now in remission.  Her last CT was done less than 2 months ago during her last hospitalization.  Continue oxygen nebulizers and steroids.   Hypothyroidism - continue Synthroid  Hypertension -patient is on losartan and metoprolol at home blood pressure is borderline low so we will hold with losartan.  Resume metoprolol.  History of hyperlipidemia.  On Crestor.   History of anxiety depression.  On Zoloft.   Interval History:  Has been ambulating better in the hall with less dyspnea.  Still has quite a bit of rhonchi and wheezing with decreased air movement.   Old records reviewed in assessment of this patient  Antimicrobials: Rocephin 1/13 >> 09/23/2023 Doxycycline 09/22/2023 >> 09/27/2023  DVT prophylaxis:   apixaban (ELIQUIS) tablet 5 mg   Code Status:   Code Status:  Limited: Do not attempt resuscitation (DNR) -DNR-LIMITED -Do Not Intubate/DNI   Mobility Assessment (Last 72 Hours)     Mobility Assessment     Row Name 09/26/23 2124 09/26/23 0850 09/25/23 2000 09/25/23 0736 09/24/23 1917   Does patient have an order for bedrest or is patient medically unstable No - Continue assessment No - Continue assessment No - Continue assessment No - Continue assessment No - Continue assessment   What is the highest level of mobility based on the progressive mobility assessment? Level 5 (Walks with assist in room/hall) - Balance while stepping forward/back and can walk in room with assist - Complete Level 5 (Walks with assist in room/hall) - Balance while stepping forward/back and can walk in room with assist - Complete Level 6 (Walks independently in room and hall) - Balance while walking in room without assist - Complete Level 6 (Walks independently in room and hall) - Balance while walking in room without assist - Complete Level 6 (Walks independently in room and hall) - Balance while walking in room without assist - Complete            Barriers to discharge:  Disposition Plan:  Home HH orders placed:  Status is: Inpt  Objective: Blood pressure 107/73, pulse 74, temperature 98.2 F (36.8 C), resp. rate 16, height 5\' 3"  (1.6 m), weight 46 kg, SpO2 97%.  Examination:  Physical Exam Constitutional:      Appearance: Normal appearance.  HENT:     Head: Normocephalic and atraumatic.  Mouth/Throat:     Mouth: Mucous membranes are moist.  Eyes:     Extraocular Movements: Extraocular movements intact.  Cardiovascular:     Rate and Rhythm: Normal rate and regular rhythm.  Pulmonary:     Effort: Pulmonary effort is normal. No respiratory distress.     Breath sounds: Wheezing present.     Comments: Tight and coarse breath sounds bilaterally (overall some improvement) Abdominal:     General: Bowel sounds are normal. There is no distension.     Palpations:  Abdomen is soft.     Tenderness: There is no abdominal tenderness.  Musculoskeletal:        General: Normal range of motion.     Cervical back: Normal range of motion and neck supple.  Skin:    General: Skin is warm and dry.  Neurological:     General: No focal deficit present.     Mental Status: She is alert.  Psychiatric:        Mood and Affect: Mood normal.      Consultants:    Procedures:    Data Reviewed: No results found for this or any previous visit (from the past 24 hours).   I have reviewed pertinent nursing notes, vitals, labs, and images as necessary. I have ordered labwork to follow up on as indicated.  I have reviewed the last notes from staff over past 24 hours. I have discussed patient's care plan and test results with nursing staff, CM/SW, and other staff as appropriate.  Time spent: Greater than 50% of the 55 minute visit was spent in counseling/coordination of care for the patient as laid out in the A&P.   LOS: 5 days   [name], MD [facility] 09/27/2023, 1:27 PM

## 2023-09-28 DIAGNOSIS — J441 Chronic obstructive pulmonary disease with (acute) exacerbation: Secondary | ICD-10-CM | POA: Diagnosis not present

## 2023-09-28 MED ORDER — IPRATROPIUM-ALBUTEROL 0.5-2.5 (3) MG/3ML IN SOLN: 3.0000 mL | Freq: Three times a day (TID) | RESPIRATORY_TRACT | Status: DC

## 2023-09-28 MED ORDER — PREDNISONE 10 MG PO TABS: ORAL_TABLET | ORAL | 0 refills | Status: DC

## 2023-09-28 NOTE — TOC Transition Note (Addendum)
Transition of Care [facility]) - Discharge Note   Patient Details  Name: Tracy Park MRN: [id number] Date of Birth: 22-Apr-1956  [facility] CM/SW Contact:  Maryjean Ka, LCSW Phone Number: 09/28/2023, 4:41 PM   Clinical Narrative:    This CSW met with patient at bedside. This CSW introduced self and explained role of CSW. Patient reports that she needs assistance for transport home because her daughter is ill. Patient also does not have an O2 transport tank to transport home. Per chart review patient receives O2 DME through [employer]. This CSW called Mittie Bodo [phone] and ordered a travel O2 transport tank. This CSW coordinated transport with [facility] [employer] with an estimate cost of $23.00. This CSW reviewed SDOH needs. Patient reports that she has insurance, and CSW confirmed insurance is listed in patient's chart. Patient states that she has a PCP and has no issues with PCP, and has access to medication and transportation for appointments.   Final next level of care: Home/Self Care Barriers to Discharge: No Barriers Identified   Patient Goals and CMS Choice Patient states their goals for this hospitalization and ongoing recovery are:: Regain strength, stabilization and return to the community. CMS Medicare.gov Compare Post Acute Care list provided to:: Patient        Discharge Placement                    Patient and family notified of of transfer: 09/28/23  Discharge Plan and Services Additional resources added to the After Visit Summary for                  DME Arranged: Oxygen DME Agency: [employer] Date DME Agency Contacted: 09/28/23 Time DME Agency Contacted: 1500 Representative spoke with at DME Agency: Vaughan Basta [phone]            Social Drivers of Health (SDOH) Interventions SDOH Screenings   Food Insecurity: No Food Insecurity (09/23/2023)  Housing: High Risk (09/23/2023)  Transportation Needs: No Transportation  Needs (09/23/2023)  Utilities: Not At Risk (09/23/2023)  Financial Resource Strain: Medium Risk (04/07/2023)   Received from [facility]  Physical Activity: Unknown (04/07/2023)   Received from [facility]  Social Connections: Moderately Isolated (09/23/2023)  Stress: Stress Concern Present (04/07/2023)   Received from [facility]  Tobacco Use: High Risk (09/22/2023)     Readmission Risk Interventions    09/28/2023    4:41 PM 09/24/2023    2:07 PM 08/04/2023    3:04 PM  Readmission Risk Prevention Plan  Transportation Screening Complete Complete Complete  Medication Review Oceanographer) Complete Complete Complete  PCP or Specialist appointment within 3-5 days of discharge Complete Complete Complete  HRI or Home Care Consult Complete Complete Complete  SW Recovery Care/Counseling Consult Complete Complete Complete  Palliative Care Screening Not Applicable Not Applicable Not Applicable  Skilled Nursing Facility Not Applicable Not Applicable Not Applicable

## 2023-09-28 NOTE — Discharge Summary (Signed)
Physician Discharge Summary   Tracy Park ZYS:[id number] DOB: 03/19/1956 DOA: 09/22/2023  PCP: Jacquelin Hawking, PA-C  Admit date: 09/22/2023 Discharge date: 09/28/2023  Admitted From: Home Disposition:  Home Discharging physician: [name], MD Barriers to discharge: none   Discharge Condition: stable CODE STATUS: DNR Diet recommendation:  Diet Orders (From admission, onward)     Start     Ordered   09/28/23 0000  Diet general        09/28/23 1045   09/22/23 2144  Diet regular Room service appropriate? Yes; Fluid consistency: Thin  Diet effective now       Question Answer Comment  Room service appropriate? Yes   Fluid consistency: Thin      09/22/23 2146            Hospital Course: Tracy Park is a 68 y.o. female with PMH advanced COPD on 2 L O2, PAF on Eliquis, hypothyroidism, hypertension, anxiety and depression, history of lung cancer status post upper lobe resection more than 5 years ago currently in remission presented to the hospital with increasing shortness of breath and wheezing.  Patient was seen in the ED a week prior to this presentation and given 5-day course of prednisone which she finished 2 days prior to presentation but then continued to have wheezing and shortness of breath so she came to the ED.[name] She has continued to smoke prior to hospitalization as well. She was admitted for COPD exacerbation treatment.    Assessment and Plan:   Acute COPD exacerbation  - continue steroids and nebs; prednisone taper continued at discharge  - completed doxy course - morphine PRN for WOB   Paroxysmal atrial fibrillation.-continue Eliquis.  Continue metoprolol.  Rate controlled at this time.   History of left upper lobe lobectomy and lung cancer greater than 5 years ago.  Now in remission.  Her last CT was done less than 2 months ago during her last hospitalization.  Continue oxygen nebulizers and steroids.   Hypothyroidism - continue  Synthroid  Hypertension -patient is on losartan and metoprolol at home blood pressure is borderline low so we will hold with losartan.  Resume metoprolol.  History of hyperlipidemia.  On Crestor.   History of anxiety depression.  On Zoloft.    The patient's acute and chronic medical conditions were treated accordingly. On day of discharge, patient was felt deemed stable for discharge. Patient/family member advised to call PCP or come back to ER if needed.   Principal Diagnosis: COPD with acute exacerbation [facility])  Discharge Diagnoses: Active Hospital Problems   Diagnosis Date Noted   COPD with acute exacerbation (HCC) 09/22/2023    Resolved Hospital Problems  No resolved problems to display.     Discharge Instructions     Diet general   Complete by: As directed    Increase activity slowly   Complete by: As directed       Allergies as of 09/28/2023       Reactions   Red Dye #40 (allura Red) Hives, Itching, Other (See Comments)   Red food dye   Strawberry Extract Hives, Itching   Tomato Hives, Itching   Aspirin Hives   Tape Rash, Other (See Comments)   Prefers paper tape   Wound Dressing Adhesive Rash        Medication List     STOP taking these medications    pantoprazole 40 MG tablet Commonly known as: PROTONIX       TAKE these medications  albuterol (2.5 MG/3ML) 0.083% nebulizer solution Commonly known as: PROVENTIL Take 3 mLs (2.5 mg total) by nebulization every 4 (four) hours while awake for 3 days, THEN 3 mLs (2.5 mg total) every 4 (four) hours as needed for wheezing or shortness of breath. Start taking on: June 26, 2023   albuterol 108 (90 Base) MCG/ACT inhaler Commonly known as: VENTOLIN HFA Inhale 2 puffs into the lungs every 4 (four) hours as needed for wheezing or shortness of breath.   Breztri Aerosphere 160-9-4.8 MCG/ACT Aero Generic drug: Budeson-Glycopyrrol-Formoterol Inhale 1 puff into the lungs in the morning and at bedtime.    Eliquis 5 MG Tabs tablet Generic drug: apixaban Take 1 tablet (5 mg total) by mouth 2 (two) times daily.   levothyroxine 125 MCG tablet Commonly known as: SYNTHROID Take 125 mcg by mouth daily before breakfast.   losartan 25 MG tablet Commonly known as: COZAAR Take 12.5 mg by mouth daily.   magnesium oxide 400 (240 Mg) MG tablet Commonly known as: MAG-OX Take 400 mg by mouth in the morning.   metoprolol succinate 25 MG 24 hr tablet Commonly known as: TOPROL-XL Take 0.5 tablets (12.5 mg total) by mouth daily.   nitroGLYCERIN 0.4 MG SL tablet Commonly known as: NITROSTAT Place 0.4 mg under the tongue every 5 (five) minutes as needed for chest pain.   oxyCODONE 5 MG immediate release tablet Commonly known as: Roxicodone Take 1 tablet (5 mg total) by mouth every 6 (six) hours as needed for severe pain (pain score 7-10).   OXYGEN Inhale 2 L/min into the lungs continuous.   predniSONE 10 MG tablet Commonly known as: DELTASONE Take 4 tablets by mouth daily for 4 days, then 3 tablets daily for 4 days, then 2 tablets daily for 4 days, then 1 tablet daily for 4 days   rosuvastatin 20 MG tablet Commonly known as: CRESTOR Take 20 mg by mouth daily.   sertraline 25 MG tablet Commonly known as: ZOLOFT Take 25 mg by mouth in the morning.   Systane Complete PF 0.6 % Soln Generic drug: Propylene Glycol (PF) Place 1 drop into both eyes 3 (three) times daily as needed (for dryness).   Tylenol 8 Hour 650 MG CR tablet Generic drug: acetaminophen Take 650-1,300 mg by mouth 2 (two) times daily as needed for pain.               Durable Medical Equipment  (From admission, onward)           Start     Ordered   09/28/23 1228  For home use only DME oxygen  Once       Question Answer Comment  Length of Need Lifetime   Mode or (Route) Nasal cannula   Liters per Minute 2   Frequency Continuous (stationary and portable oxygen unit needed)   Oxygen conserving device Yes    Oxygen delivery system Gas      09/28/23 1227            Allergies  Allergen Reactions   Red Dye #40 (Allura Red) Hives, Itching and Other (See Comments)    Red food dye   Strawberry Extract Hives and Itching   Tomato Hives and Itching   Aspirin Hives   Tape Rash and Other (See Comments)    Prefers paper tape   Wound Dressing Adhesive Rash    Consultations:   Procedures:   Discharge Exam: BP 130/82 (BP Location: Left Arm)   Pulse 80   Temp  97.9 F (36.6 C) (Oral)   Resp 18   Ht 5\' 3"  (1.6 m)   Wt 46 kg   SpO2 97%   BMI 17.96 kg/m  Physical Exam Constitutional:      Appearance: Normal appearance.  HENT:     Head: Normocephalic and atraumatic.     Mouth/Throat:     Mouth: Mucous membranes are moist.  Eyes:     Extraocular Movements: Extraocular movements intact.  Cardiovascular:     Rate and Rhythm: Normal rate and regular rhythm.  Pulmonary:     Effort: Pulmonary effort is normal. No respiratory distress.     Breath sounds: Wheezing present.     Comments: Tight and coarse breath sounds bilaterally (overall improved) Abdominal:     General: Bowel sounds are normal. There is no distension.     Palpations: Abdomen is soft.     Tenderness: There is no abdominal tenderness.  Musculoskeletal:        General: Normal range of motion.     Cervical back: Normal range of motion and neck supple.  Skin:    General: Skin is warm and dry.  Neurological:     General: No focal deficit present.     Mental Status: She is alert.  Psychiatric:        Mood and Affect: Mood normal.      The results of significant diagnostics from this hospitalization (including imaging, microbiology, ancillary and laboratory) are listed below for reference.   Microbiology: Recent Results (from the past 240 hours)  Resp panel by RT-PCR (RSV, Flu A&B, Covid) Anterior Nasal Swab     Status: None   Collection Time: 09/22/23  9:28 PM   Specimen: Anterior Nasal Swab  Result Value Ref  Range Status   SARS Coronavirus 2 by RT PCR NEGATIVE NEGATIVE Final    Comment: (NOTE) SARS-CoV-2 target nucleic acids are NOT DETECTED.  The SARS-CoV-2 RNA is generally detectable in upper respiratory specimens during the acute phase of infection. The lowest concentration of SARS-CoV-2 viral copies this assay can detect is 138 copies/mL. A negative result does not preclude SARS-Cov-2 infection and should not be used as the sole basis for treatment or other patient management decisions. A negative result may occur with  improper specimen collection/handling, submission of specimen other than nasopharyngeal swab, presence of viral mutation(s) within the areas targeted by this assay, and inadequate number of viral copies(<138 copies/mL). A negative result must be combined with clinical observations, patient history, and epidemiological information. The expected result is Negative.  Fact Sheet for Patients:  BloggerCourse.com  Fact Sheet for Healthcare Providers:  SeriousBroker.it  This test is no t yet approved or cleared by the [place] FDA and  has been authorized for detection and/or diagnosis of SARS-CoV-2 by FDA under an Emergency Use Authorization (EUA). This EUA will remain  in effect (meaning this test can be used) for the duration of the COVID-19 declaration under Section 564(b)(1) of the Act, 21 U.S.C.section 360bbb-3(b)(1), unless the authorization is terminated  or revoked sooner.       Influenza A by PCR NEGATIVE NEGATIVE Final   Influenza B by PCR NEGATIVE NEGATIVE Final    Comment: (NOTE) The Xpert Xpress SARS-CoV-2/FLU/RSV plus assay is intended as an aid in the diagnosis of influenza from Nasopharyngeal swab specimens and should not be used as a sole basis for treatment. Nasal washings and aspirates are unacceptable for Xpert Xpress SARS-CoV-2/FLU/RSV testing.  Fact Sheet for  Patients: BloggerCourse.com  Fact  Sheet for Healthcare Providers: SeriousBroker.it  This test is not yet approved or cleared by the [place] and has been authorized for detection and/or diagnosis of SARS-CoV-2 by FDA under an Emergency Use Authorization (EUA). This EUA will remain in effect (meaning this test can be used) for the duration of the COVID-19 declaration under Section 564(b)(1) of the Act, 21 U.S.C. section 360bbb-3(b)(1), unless the authorization is terminated or revoked.     Resp Syncytial Virus by PCR NEGATIVE NEGATIVE Final    Comment: (NOTE) Fact Sheet for Patients: BloggerCourse.com  Fact Sheet for Healthcare Providers: SeriousBroker.it  This test is not yet approved or cleared by the [place] FDA and has been authorized for detection and/or diagnosis of SARS-CoV-2 by FDA under an Emergency Use Authorization (EUA). This EUA will remain in effect (meaning this test can be used) for the duration of the COVID-19 declaration under Section 564(b)(1) of the Act, 21 U.S.C. section 360bbb-3(b)(1), unless the authorization is terminated or revoked.  Performed at [facility], [address] [place], [place] [address]      Labs: BNP (last 3 results) Recent Labs    03/06/23 1938 04/16/23 2048 08/03/23 1723  BNP 54.6 114.4* 68.2   Basic Metabolic Panel: Recent Labs  Lab 09/22/23 1809 09/23/23 0656 09/24/23 0443  NA 140 137 138  K 3.6 3.9 4.2  CL 101 100 102  CO2 30 29 29   GLUCOSE 136* 114* 137*  BUN 14 18 25*  CREATININE 0.50 0.44 0.76  CALCIUM 8.8* 8.6* 8.7*  MG  --  2.1 1.9   Liver Function Tests: No results for input(s): "AST", "ALT", "ALKPHOS", "BILITOT", "PROT", "ALBUMIN" in the last 168 hours. No results for input(s): "LIPASE", "AMYLASE" in the last 168 hours. No results for input(s): "AMMONIA" in the last 168  hours. CBC: Recent Labs  Lab 09/22/23 1809 09/23/23 0656 09/24/23 0443  WBC 9.9 7.3 15.4*  HGB 11.6* 12.3 11.0*  HCT 37.8 39.6 35.3*  MCV 95.0 94.3 95.4  PLT 200 225 220   Cardiac Enzymes: No results for input(s): "CKTOTAL", "CKMB", "CKMBINDEX", "TROPONINI" in the last 168 hours. BNP: Invalid input(s): "POCBNP" CBG: No results for input(s): "GLUCAP" in the last 168 hours. D-Dimer No results for input(s): "DDIMER" in the last 72 hours. Hgb A1c No results for input(s): "HGBA1C" in the last 72 hours. Lipid Profile No results for input(s): "CHOL", "HDL", "LDLCALC", "TRIG", "CHOLHDL", "LDLDIRECT" in the last 72 hours. Thyroid function studies No results for input(s): "TSH", "T4TOTAL", "T3FREE", "THYROIDAB" in the last 72 hours.  Invalid input(s): "FREET3" Anemia work up No results for input(s): "VITAMINB12", "FOLATE", "FERRITIN", "TIBC", "IRON", "RETICCTPCT" in the last 72 hours. Urinalysis    Component Value Date/Time   COLORURINE YELLOW 06/25/2023 0536   APPEARANCEUR CLEAR 06/25/2023 0536   LABSPEC 1.023 06/25/2023 0536   PHURINE 6.0 06/25/2023 0536   GLUCOSEU 50 (A) 06/25/2023 0536   HGBUR SMALL (A) 06/25/2023 0536   BILIRUBINUR NEGATIVE 06/25/2023 0536   KETONESUR NEGATIVE 06/25/2023 0536   PROTEINUR NEGATIVE 06/25/2023 0536   UROBILINOGEN 0.2 11/06/2009 1559   NITRITE NEGATIVE 06/25/2023 0536   LEUKOCYTESUR NEGATIVE 06/25/2023 0536   Sepsis Labs Recent Labs  Lab 09/22/23 1809 09/23/23 0656 09/24/23 0443  WBC 9.9 7.3 15.4*   Microbiology Recent Results (from the past 240 hours)  Resp panel by RT-PCR (RSV, Flu A&B, Covid) Anterior Nasal Swab     Status: None   Collection Time: 09/22/23  9:28 PM   Specimen: Anterior  Nasal Swab  Result Value Ref Range Status   SARS Coronavirus 2 by RT PCR NEGATIVE NEGATIVE Final    Comment: (NOTE) SARS-CoV-2 target nucleic acids are NOT DETECTED.  The SARS-CoV-2 RNA is generally detectable in upper respiratory specimens  during the acute phase of infection. The lowest concentration of SARS-CoV-2 viral copies this assay can detect is 138 copies/mL. A negative result does not preclude SARS-Cov-2 infection and should not be used as the sole basis for treatment or other patient management decisions. A negative result may occur with  improper specimen collection/handling, submission of specimen other than nasopharyngeal swab, presence of viral mutation(s) within the areas targeted by this assay, and inadequate number of viral copies(<138 copies/mL). A negative result must be combined with clinical observations, patient history, and epidemiological information. The expected result is Negative.  Fact Sheet for Patients:  BloggerCourse.com  Fact Sheet for Healthcare Providers:  SeriousBroker.it  This test is no t yet approved or cleared by the [place] FDA and  has been authorized for detection and/or diagnosis of SARS-CoV-2 by FDA under an Emergency Use Authorization (EUA). This EUA will remain  in effect (meaning this test can be used) for the duration of the COVID-19 declaration under Section 564(b)(1) of the Act, 21 U.S.C.section 360bbb-3(b)(1), unless the authorization is terminated  or revoked sooner.       Influenza A by PCR NEGATIVE NEGATIVE Final   Influenza B by PCR NEGATIVE NEGATIVE Final    Comment: (NOTE) The Xpert Xpress SARS-CoV-2/FLU/RSV plus assay is intended as an aid in the diagnosis of influenza from Nasopharyngeal swab specimens and should not be used as a sole basis for treatment. Nasal washings and aspirates are unacceptable for Xpert Xpress SARS-CoV-2/FLU/RSV testing.  Fact Sheet for Patients: BloggerCourse.com  Fact Sheet for Healthcare Providers: SeriousBroker.it  This test is not yet approved or cleared by the [place] FDA and has been authorized for detection  and/or diagnosis of SARS-CoV-2 by FDA under an Emergency Use Authorization (EUA). This EUA will remain in effect (meaning this test can be used) for the duration of the COVID-19 declaration under Section 564(b)(1) of the Act, 21 U.S.C. section 360bbb-3(b)(1), unless the authorization is terminated or revoked.     Resp Syncytial Virus by PCR NEGATIVE NEGATIVE Final    Comment: (NOTE) Fact Sheet for Patients: BloggerCourse.com  Fact Sheet for Healthcare Providers: SeriousBroker.it  This test is not yet approved or cleared by the [place] FDA and has been authorized for detection and/or diagnosis of SARS-CoV-2 by FDA under an Emergency Use Authorization (EUA). This EUA will remain in effect (meaning this test can be used) for the duration of the COVID-19 declaration under Section 564(b)(1) of the Act, 21 U.S.C. section 360bbb-3(b)(1), unless the authorization is terminated or revoked.  Performed at [facility], [address] [place], [place] [address]     Procedures/Studies: DG Chest 2 View Result Date: 09/22/2023 CLINICAL DATA:  Shortness of breath EXAM: CHEST - 2 VIEW COMPARISON:  09/11/2023 FINDINGS: Left apical scarring with volume loss and upper hilar retraction. Lungs are otherwise clear. Chronic blunting of the left costophrenic angle without definite pleural effusion. No pneumothorax. Heart is normal in size. Visualized osseous structures are within normal limits. IMPRESSION: Left apical scarring with volume loss, as above. No acute cardiopulmonary disease. Electronically Signed   By: Charline Bills M.D.   On: 09/22/2023 19:23   DG Chest 2 View Result Date: 09/11/2023 CLINICAL DATA:  One-week history of shortness of  breath and left-sided chest pain EXAM: CHEST - 2 VIEW COMPARISON:  Chest radiograph dated 08/06/2023 FINDINGS: Scattered surgical clips projecting over the left lung. Normal lung volumes.  Similar irregular left apical opacity and scarring. Increased conspicuity of patchy right apical opacity. No pleural effusion or pneumothorax. The heart size and mediastinal contours are within normal limits. No acute osseous abnormality. IMPRESSION: 1. Increased conspicuity of patchy right apical opacity, which may represent atelectasis, aspiration, or pneumonia. 2. Similar irregular left apical opacity and scarring. Electronically Signed   By: Agustin Cree M.D.   On: 09/11/2023 20:50     Time coordinating discharge: Over 30 minutes    [name], MD  [facility] 09/28/2023, 1:33 PM

## 2023-09-28 NOTE — Plan of Care (Signed)
  Problem: Clinical Measurements: Goal: Respiratory complications will improve Outcome: Progressing   Problem: Coping: Goal: Level of anxiety will decrease Outcome: Progressing   

## 2023-09-28 NOTE — Progress Notes (Addendum)
Pt calm resting in bed    md girguis, chelsea miller cm  and this nurse completed all discharge info, pt has 02 with her for transport from [facility]   pt has cab ticket to transport her home, pt has no issues with safety at home  all belongings bagged up  iv removed   no distress noted   all docx  reviewed by discharge Rn   and pt cleared   AVS printed  pt verbalized understanding of all d/c information  and pt transported down to lobby for pick up

## 2023-09-28 NOTE — Plan of Care (Signed)

## 2023-10-02 ENCOUNTER — Emergency Department (HOSPITAL_COMMUNITY): Payer: 59

## 2023-10-02 ENCOUNTER — Encounter (HOSPITAL_COMMUNITY): Payer: Self-pay

## 2023-10-02 ENCOUNTER — Emergency Department (HOSPITAL_COMMUNITY)
Admission: EM | Admit: 2023-10-02 | Discharge: 2023-10-02 | Disposition: A | Discharge status: Home / Self Care | Payer: 59 | Attending: Emergency Medicine | Admitting: Emergency Medicine

## 2023-10-02 ENCOUNTER — Other Ambulatory Visit: Payer: Self-pay

## 2023-10-02 DIAGNOSIS — S93602A Unspecified sprain of left foot, initial encounter: Secondary | ICD-10-CM | POA: Diagnosis not present

## 2023-10-02 DIAGNOSIS — Z7901 Long term (current) use of anticoagulants: Secondary | ICD-10-CM | POA: Insufficient documentation

## 2023-10-02 DIAGNOSIS — S99922A Unspecified injury of left foot, initial encounter: Secondary | ICD-10-CM | POA: Diagnosis present

## 2023-10-02 DIAGNOSIS — W19XXXA Unspecified fall, initial encounter: Secondary | ICD-10-CM | POA: Insufficient documentation

## 2023-10-02 MED ORDER — HYDROCODONE-ACETAMINOPHEN 5-325 MG PO TABS
1.0000 | ORAL_TABLET | Freq: Once | ORAL | Status: AC
  Administered 2023-10-02: 1 via ORAL
  Filled 2023-10-02: qty 1

## 2023-10-02 NOTE — ED Triage Notes (Addendum)
C/o left foot pain, swelling, and bruising x1 day after mechanical fall yesterday.  Left pedal pulse and cap refill noted.  Tylenol w/o relief.  Pt currently on eliquis and on 2lpm Williamson baseline.

## 2023-10-02 NOTE — Progress Notes (Signed)
Orthopedic Tech Progress Note Patient Details:  MEERAB SPIELMANN 1955-10-01 [id number]  Ortho Devices Type of Ortho Device: Ace wrap, CAM walker Ortho Device/Splint Location: ace applied to left foot/ankle. cam boot applied to  left leg Ortho Device/Splint Interventions: Ordered, Application, Adjustment   Post Interventions Patient Tolerated: Well Instructions Provided: Adjustment of device, Care of device  Kizzie Fantasia 10/02/2023, 3:37 PM

## 2023-10-02 NOTE — ED Provider Notes (Signed)
[place] EMERGENCY DEPARTMENT AT [facility] Provider Note   CSN: [id number] Arrival date & time: 10/02/23  1244     History  Chief Complaint  Patient presents with   Foot Pain    Tracy Park is a 68 y.o. female.  HPI 68 year old female presents with left foot pain.  Yesterday she was getting up and her foot twisted and she fell.  She did not hit her head.  She is on Eliquis and has had bruising and swelling to the left lateral foot.  No numbness or weakness.  It involves her left ankle as well.  No other injuries.  Home Medications Prior to Admission medications   Medication Sig Start Date End Date Taking? Authorizing Provider  albuterol (PROVENTIL) (2.5 MG/3ML) 0.083% nebulizer solution Take 3 mLs (2.5 mg total) by nebulization every 4 (four) hours while awake for 3 days, THEN 3 mLs (2.5 mg total) every 4 (four) hours as needed for wheezing or shortness of breath. 06/26/23 09/22/23  Danford, Earl Lites, MD  albuterol (VENTOLIN HFA) 108 (90 Base) MCG/ACT inhaler Inhale 2 puffs into the lungs every 4 (four) hours as needed for wheezing or shortness of breath. 08/09/23   Lanae Boast, MD  apixaban (ELIQUIS) 5 MG TABS tablet Take 1 tablet (5 mg total) by mouth 2 (two) times daily. 06/06/23   Jonah Blue, MD  Budeson-Glycopyrrol-Formoterol (BREZTRI AEROSPHERE) 160-9-4.8 MCG/ACT AERO Inhale 1 puff into the lungs in the morning and at bedtime.    [provider]  levothyroxine (SYNTHROID) 125 MCG tablet Take 125 mcg by mouth daily before breakfast.    [provider]  losartan (COZAAR) 25 MG tablet Take 12.5 mg by mouth daily. 08/29/23   [provider]  magnesium oxide (MAG-OX) 400 (240 Mg) MG tablet Take 400 mg by mouth in the morning.    [provider]  metoprolol succinate (TOPROL-XL) 25 MG 24 hr tablet Take 0.5 tablets (12.5 mg total) by mouth daily. 06/22/23 09/22/23  Narda Bonds, MD  nitroGLYCERIN (NITROSTAT) 0.4 MG SL  tablet Place 0.4 mg under the tongue every 5 (five) minutes as needed for chest pain.    [provider]  oxyCODONE (ROXICODONE) 5 MG immediate release tablet Take 1 tablet (5 mg total) by mouth every 6 (six) hours as needed for severe pain (pain score 7-10). 09/12/23   Zadie Rhine, MD  OXYGEN Inhale 2 L/min into the lungs continuous.    [provider]  predniSONE (DELTASONE) 10 MG tablet Take 4 tablets by mouth daily for 4 days, then 3 tablets daily for 4 days, then 2 tablets daily for 4 days, then 1 tablet daily for 4 days 09/28/23   [name], MD  rosuvastatin (CRESTOR) 20 MG tablet Take 20 mg by mouth daily.    [provider]  sertraline (ZOLOFT) 25 MG tablet Take 25 mg by mouth in the morning.    [provider]  SYSTANE COMPLETE PF 0.6 % SOLN Place 1 drop into both eyes 3 (three) times daily as needed (for dryness).    [provider]  TYLENOL 8 HOUR 650 MG CR tablet Take 650-1,300 mg by mouth 2 (two) times daily as needed for pain.    [provider]      Allergies    Red dye #40 (allura red), Strawberry extract, Tomato, Aspirin, Tape, and Wound dressing adhesive    Review of Systems   Review of Systems  Musculoskeletal:  Positive for arthralgias and  joint swelling.  Skin:  Positive for color change.  Neurological:  Negative for weakness, numbness and headaches.    Physical Exam Updated Vital Signs BP (!) 144/102 (BP Location: Right Arm)   Pulse 92   Temp 98.3 F (36.8 C) (Oral)   Resp 16   Ht 5\' 3"  (1.6 m)   Wt 46 kg   SpO2 97%   BMI 17.96 kg/m  Physical Exam Vitals and nursing note reviewed.  Constitutional:      Appearance: She is well-developed.  HENT:     Head: Normocephalic and atraumatic.  Cardiovascular:     Rate and Rhythm: Normal rate and regular rhythm.     Pulses:          Dorsalis pedis pulses are 2+ on the left side.  Pulmonary:     Effort: Pulmonary effort is normal.  Musculoskeletal:      Left ankle: Ecchymosis present. No swelling. Tenderness present. Normal range of motion.     Left Achilles Tendon: No tenderness or defects.     Left foot: Swelling and tenderness present.     Comments: Diffuse mid to lateral left foot ecchymosis and swelling. Mildly into left ankle. Grossly normal sensation.  Skin:    General: Skin is warm and dry.  Neurological:     Mental Status: She is alert.     ED Results / Procedures / Treatments   Labs (all labs ordered are listed, but only abnormal results are displayed) Labs Reviewed - No data to display  EKG None  Radiology DG Ankle Complete Left Result Date: 10/02/2023 CLINICAL DATA:  Fall with pain and swelling. EXAM: LEFT ANKLE COMPLETE - 3+ VIEW COMPARISON:  None Available. FINDINGS: There is no evidence of fracture, dislocation, or joint effusion. There is no evidence of arthropathy or other focal bone abnormality. Soft tissues are unremarkable. IMPRESSION: Negative. Electronically Signed   By: Kennith Center M.D.   On: 10/02/2023 14:59   DG Foot Complete Left Result Date: 10/02/2023 CLINICAL DATA:  Patient fell yesterday. Now with left foot pain and swelling. EXAM: LEFT FOOT - COMPLETE 3+ VIEW COMPARISON:  None Available. FINDINGS: There is no evidence of fracture or dislocation. There is no evidence of arthropathy or other focal bone abnormality. Soft tissues are unremarkable. IMPRESSION: Negative. Electronically Signed   By: Kennith Center M.D.   On: 10/02/2023 14:58    Procedures Procedures    Medications Ordered in ED Medications  HYDROcodone-acetaminophen (NORCO/VICODIN) 5-325 MG per tablet 1 tablet (1 tablet Oral Given 10/02/23 1415)    ED Course/ Medical Decision Making/ A&P                                 Medical Decision Making Amount and/or Complexity of Data Reviewed Radiology: ordered and independent interpretation performed.    Details: No fractures  Risk Prescription drug management.   Patient appears to  have some ecchymosis causing her pain.  Likely sprain based on mechanism.  Doubt occult fracture.  Will wrap her foot with an Ace wrap, advised continued elevation, rest, ice, compression and give her a cam boot.  Otherwise follow-up with PCP as needed or if not improving.  Neurovascularly intact.        Final Clinical Impression(s) / ED Diagnoses Final diagnoses:  Sprain of left foot, initial encounter    Rx / DC Orders ED Discharge Orders     None  Pricilla Loveless, MD 10/02/23 1524

## 2023-10-02 NOTE — Discharge Instructions (Signed)
Be sure to wrap your foot with the ACE wrap, and keep it elevated and use ice to help with pain and swelling.  You can also use the boot provided to help with pain and walking.  If you develop new or worsening pain or swelling, numbness, or any other new/concerning symptoms then return to the ER or call 911.

## 2023-10-18 ENCOUNTER — Emergency Department (HOSPITAL_COMMUNITY)
Admission: EM | Admit: 2023-10-18 | Discharge: 2023-10-19 | Disposition: A | Discharge status: Home / Self Care | Payer: 59

## 2023-10-18 ENCOUNTER — Emergency Department (HOSPITAL_COMMUNITY): Payer: 59

## 2023-10-18 ENCOUNTER — Other Ambulatory Visit: Payer: Self-pay

## 2023-10-18 DIAGNOSIS — R0602 Shortness of breath: Secondary | ICD-10-CM | POA: Diagnosis present

## 2023-10-18 DIAGNOSIS — Z7901 Long term (current) use of anticoagulants: Secondary | ICD-10-CM | POA: Insufficient documentation

## 2023-10-18 DIAGNOSIS — D72829 Elevated white blood cell count, unspecified: Secondary | ICD-10-CM | POA: Insufficient documentation

## 2023-10-18 DIAGNOSIS — J441 Chronic obstructive pulmonary disease with (acute) exacerbation: Secondary | ICD-10-CM | POA: Insufficient documentation

## 2023-10-18 DIAGNOSIS — R0789 Other chest pain: Secondary | ICD-10-CM | POA: Diagnosis not present

## 2023-10-18 LAB — CBC
HCT: 40.2 % (ref 36.0–46.0)
Hemoglobin: 12.1 g/dL (ref 12.0–15.0)
MCH: 29.1 pg (ref 26.0–34.0)
MCHC: 30.1 g/dL (ref 30.0–36.0)
MCV: 96.6 fL (ref 80.0–100.0)
Platelets: 265 10*3/uL (ref 150–400)
RBC: 4.16 MIL/uL (ref 3.87–5.11)
RDW: 13.2 % (ref 11.5–15.5)
WBC: 11.8 10*3/uL — ABNORMAL HIGH (ref 4.0–10.5)
nRBC: 0 % (ref 0.0–0.2)

## 2023-10-18 LAB — BASIC METABOLIC PANEL
Anion gap: 10 (ref 5–15)
BUN: 7 mg/dL — ABNORMAL LOW (ref 8–23)
CO2: 37 mmol/L — ABNORMAL HIGH (ref 22–32)
Calcium: 8.9 mg/dL (ref 8.9–10.3)
Chloride: 94 mmol/L — ABNORMAL LOW (ref 98–111)
Creatinine, Ser: 0.65 mg/dL (ref 0.44–1.00)
GFR, Estimated: >60 mL/min (ref >60)
Glucose, Bld: 125 mg/dL — ABNORMAL HIGH (ref 70–99)
Potassium: 3.5 mmol/L (ref 3.5–5.1)
Sodium: 141 mmol/L (ref 135–145)

## 2023-10-18 LAB — TROPONIN I (HIGH SENSITIVITY): Troponin I (High Sensitivity): 10 ng/L (ref <18)

## 2023-10-18 MED ORDER — ALBUTEROL SULFATE (2.5 MG/3ML) 0.083% IN NEBU
5.0000 mg | INHALATION_SOLUTION | Freq: Once | RESPIRATORY_TRACT | Status: AC
  Administered 2023-10-18: 5 mg via RESPIRATORY_TRACT
  Filled 2023-10-18: qty 6

## 2023-10-18 MED ORDER — PREDNISONE 20 MG PO TABS: 40.0000 mg | ORAL_TABLET | Freq: Every day | ORAL | 0 refills | Status: DC

## 2023-10-18 MED ORDER — IPRATROPIUM BROMIDE 0.02 % IN SOLN
0.5000 mg | Freq: Once | RESPIRATORY_TRACT | Status: AC
  Administered 2023-10-18: 0.5 mg via RESPIRATORY_TRACT
  Filled 2023-10-18: qty 2.5

## 2023-10-18 MED ORDER — ALBUTEROL SULFATE (2.5 MG/3ML) 0.083% IN NEBU: INHALATION_SOLUTION | RESPIRATORY_TRACT | 0 refills | Status: DC

## 2023-10-18 MED ORDER — METHYLPREDNISOLONE SODIUM SUCC 125 MG IJ SOLR
125.0000 mg | Freq: Once | INTRAMUSCULAR | Status: AC
  Administered 2023-10-18: 125 mg via INTRAVENOUS
  Filled 2023-10-18: qty 2

## 2023-10-18 MED ORDER — ALBUTEROL SULFATE HFA 108 (90 BASE) MCG/ACT IN AERS: 2.0000 | INHALATION_SPRAY | RESPIRATORY_TRACT | 0 refills | Status: DC | PRN

## 2023-10-18 NOTE — ED Triage Notes (Signed)
 Patient arrived with EMS from home reports central chest pain with SOB , wheezing and chest congestion this evening , pain radiating to left arm , no emesis or diaphoresis . Received Duoneb treatment prior to arrival . CBG=130 .

## 2023-10-18 NOTE — ED Provider Notes (Signed)
  Physical Exam  BP 108/62   Pulse 95   Temp 98 F (36.7 C) (Oral)   Resp (!) 22   SpO2 100%   Physical Exam  Procedures  Procedures  ED Course / MDM   Clinical Course as of 10/18/23 2310  Sat Oct 18, 2023  1947 Admitted 1/13 for COPD exacerbation; per discharge summary:67 y.o. female with PMH advanced COPD on 2 L O2, PAF on Eliquis , hypothyroidism, hypertension, anxiety and depression, history of lung cancer status post upper lobe resection more than 5 years   [TY]  2058 DG Chest Portable 1 View MPRESSION: Postoperative and chronic changes in the left lung/apex.  No active disease.   [TY]  2131 Patient reevaluated, still complaining of subjective shortness of breath.  Does not appear to be in overt distress.  Still having some faint wheezing.  Will order another breathing treatment. [TY]    Clinical Course User Index [TY] Neysa Caron PARAS, DO   Medical Decision Making Care assumed at 9 PM.  Patient is a known COPD on oxygen  here presenting with shortness of breath.  Signout pending reassessment after another neb  11:13 PM I reassessed patient after neb and she is feeling better now.  Lungs are clear.  She is already on prednisone  so we will increase to a higher dose for several days.    Problems Addressed: Chronic obstructive pulmonary disease with acute exacerbation (HCC): acute illness or injury  Amount and/or Complexity of Data Reviewed Labs: ordered. Radiology: ordered and independent interpretation performed. Decision-making details documented in ED Course.  Risk Prescription drug management.          Patt Alm Macho, MD 10/18/23 [phone]

## 2023-10-18 NOTE — ED Provider Notes (Signed)
 [place] EMERGENCY DEPARTMENT AT [facility] Provider Note   CSN: [id number] Arrival date & time: 10/18/23  1935     History  Chief Complaint  Patient presents with  . Chest Pain  . Shortness of Breath    Tracy Park is a 68 y.o. female.  68 year old female history of COPD on 2 L of oxygen  presenting emergency department chest pain shortness of breath.  Reports symptoms started yesterday worsening shortness of breath over the course of the day.  Has tried for breathing treatments at home with little improvement.  EMS gave DuoNeb prior to arrival reports little improvement.  Chest pain left upper chest.  Sharp and intermittent.   Chest Pain Associated symptoms: shortness of breath   Shortness of Breath Associated symptoms: chest pain        Home Medications Prior to Admission medications   Medication Sig Start Date End Date Taking? Authorizing Provider  albuterol  (PROVENTIL ) (2.5 MG/3ML) 0.083% nebulizer solution Take 3 mLs (2.5 mg total) by nebulization every 4 (four) hours while awake for 3 days, THEN 3 mLs (2.5 mg total) every 4 (four) hours as needed for wheezing or shortness of breath. 06/26/23 09/22/23  DanfordLonni SQUIBB, MD  albuterol  (VENTOLIN  HFA) 108 (90 Base) MCG/ACT inhaler Inhale 2 puffs into the lungs every 4 (four) hours as needed for wheezing or shortness of breath. 08/09/23   Christobal Guadalajara, MD  apixaban  (ELIQUIS ) 5 MG TABS tablet Take 1 tablet (5 mg total) by mouth 2 (two) times daily. 06/06/23   Barbarann Nest, MD  Budeson-Glycopyrrol-Formoterol  (BREZTRI  AEROSPHERE) 160-9-4.8 MCG/ACT AERO Inhale 1 puff into the lungs in the morning and at bedtime.    [provider]  levothyroxine  (SYNTHROID ) 125 MCG tablet Take 125 mcg by mouth daily before breakfast.    [provider]  losartan  (COZAAR ) 25 MG tablet Take 12.5 mg by mouth daily. 08/29/23   [provider]  magnesium  oxide (MAG-OX) 400 (240 Mg) MG tablet Take 400 mg  by mouth in the morning.    [provider]  metoprolol  succinate (TOPROL -XL) 25 MG 24 hr tablet Take 0.5 tablets (12.5 mg total) by mouth daily. 06/22/23 09/22/23  Briana Elgin LABOR, MD  nitroGLYCERIN  (NITROSTAT ) 0.4 MG SL tablet Place 0.4 mg under the tongue every 5 (five) minutes as needed for chest pain.    [provider]  oxyCODONE  (ROXICODONE ) 5 MG immediate release tablet Take 1 tablet (5 mg total) by mouth every 6 (six) hours as needed for severe pain (pain score 7-10). 09/12/23   Midge Golas, MD  OXYGEN  Inhale 2 L/min into the lungs continuous.    [provider]  predniSONE  (DELTASONE ) 10 MG tablet Take 4 tablets by mouth daily for 4 days, then 3 tablets daily for 4 days, then 2 tablets daily for 4 days, then 1 tablet daily for 4 days 09/28/23   Patsy Lenis, MD  rosuvastatin  (CRESTOR ) 20 MG tablet Take 20 mg by mouth daily.    [provider]  sertraline  (ZOLOFT ) 25 MG tablet Take 25 mg by mouth in the morning.    [provider]  SYSTANE COMPLETE PF 0.6 % SOLN Place 1 drop into both eyes 3 (three) times daily as needed (for dryness).    [provider]  TYLENOL  8 HOUR 650 MG CR tablet Take 650-1,300 mg by mouth 2 (two) times daily as needed for pain.    [provider]      Allergies  Red dye #40 (allura red), Strawberry extract, Tomato, Aspirin, Tape, and Wound dressing adhesive    Review of Systems   Review of Systems  Respiratory:  Positive for shortness of breath.   Cardiovascular:  Positive for chest pain.    Physical Exam Updated Vital Signs BP 102/65   Pulse 98   Temp 98 F (36.7 C) (Oral)   Resp (!) 28   SpO2 100%  Physical Exam Vitals and nursing note reviewed.  Constitutional:      General: She is not in acute distress.    Appearance: She is not toxic-appearing.  Cardiovascular:     Rate and Rhythm: Normal rate and regular rhythm.  Pulmonary:     Effort: Tachypnea present. No respiratory  distress.     Breath sounds: Decreased breath sounds and wheezing present.  Abdominal:     Palpations: Abdomen is soft.  Musculoskeletal:     Right lower leg: No edema.     Left lower leg: No edema.  Skin:    General: Skin is warm.     Capillary Refill: Capillary refill takes less than 2 seconds.  Neurological:     General: No focal deficit present.     Mental Status: She is alert and oriented to person, place, and time.  Psychiatric:        Mood and Affect: Mood normal.        Behavior: Behavior normal.     ED Results / Procedures / Treatments   Labs (all labs ordered are listed, but only abnormal results are displayed) Labs Reviewed  CBC - Abnormal; Notable for the following components:      Result Value   WBC 11.8 (*)    All other components within normal limits  BASIC METABOLIC PANEL - Abnormal; Notable for the following components:   Chloride 94 (*)    CO2 37 (*)    Glucose, Bld 125 (*)    BUN 7 (*)    All other components within normal limits  TROPONIN I (HIGH SENSITIVITY)    EKG EKG Interpretation Date/Time:  Saturday October 18 2023 19:53:30 EST Ventricular Rate:  94 PR Interval:  124 QRS Duration:  103 QT Interval:  376 QTC Calculation: 471 R Axis:   178  Text Interpretation: Sinus rhythm Right atrial enlargement Confirmed by Neysa Clap [phone]) on 10/18/2023 7:58:48 PM  Radiology DG Chest Portable 1 View Result Date: 10/18/2023 CLINICAL DATA:  Chest pain EXAM: PORTABLE CHEST 1 VIEW COMPARISON:  None Available. FINDINGS: Left apical scarring again noted, unchanged. Postoperative changes in the left lung. No acute confluent airspace opacities or effusions. Heart mediastinal contours within normal limits. IMPRESSION: Postoperative and chronic changes in the left lung/apex. No active disease. Electronically Signed   By: Franky Crease M.D.   On: 10/18/2023 20:18    Procedures Procedures    Medications Ordered in ED Medications  methylPREDNISolone  sodium  succinate (SOLU-MEDROL ) 125 mg/2 mL injection 125 mg (125 mg Intravenous Given 10/18/23 2020)  albuterol  (PROVENTIL ) (2.5 MG/3ML) 0.083% nebulizer solution 5 mg (5 mg Nebulization Given 10/18/23 2021)  ipratropium (ATROVENT ) nebulizer solution 0.5 mg (0.5 mg Nebulization Given 10/18/23 2021)    ED Course/ Medical Decision Making/ A&P Clinical Course as of 10/18/23 2132  Sat Oct 18, 2023  1945 Admitted 1/13 for COPD exacerbation; per discharge summary:67 y.o. female with PMH advanced COPD on 2 L O2, PAF on Eliquis , hypothyroidism, hypertension, anxiety and depression, history of lung cancer status post upper lobe resection more than 5  years   [TY]  2058 DG Chest Portable 1 View MPRESSION: Postoperative and chronic changes in the left lung/apex.  No active disease.   [TY]  2131 Patient reevaluated, still complaining of subjective shortness of breath.  Does not appear to be in overt distress.  Still having some faint wheezing.  Will order another breathing treatment. [TY]    Clinical Course User Index [TY] Neysa Caron PARAS, DO                                 Medical Decision Making This is a 68 year old female history of COPD on 2 L of oxygen  presenting emergency department for shortness of breath.  Slightly tachypneic, does not appear to be in overt respiratory distress.  Maintaining oxygen  saturation on her home 2 L.  Was given a breathing treatment by EMS with little improvement.  Chest x-ray without pneumonia.  EKG similar to prior.  Slight leukocytosis.  Troponin not elevated; acs unlikely.  Given Solu-Medrol  and breathing treatment here for COPD exacerbation. Plan to re-evaluate.   Amount and/or Complexity of Data Reviewed Labs: ordered. Radiology: ordered. Decision-making details documented in ED Course.  Risk Prescription drug management.           Final Clinical Impression(s) / ED Diagnoses Final diagnoses:  None    Rx / DC Orders ED Discharge Orders     None          Neysa Caron PARAS, DO 10/18/23 2118

## 2023-10-18 NOTE — Discharge Instructions (Signed)
 Please increase prednisone  to 40 mg for the next 4 days and then back down to your lower dose  Use albuterol  as needed  See your doctor for follow-up  Return to ER if you have worse cough or shortness of breath or wheezing

## 2023-10-20 ENCOUNTER — Other Ambulatory Visit: Payer: Self-pay

## 2023-10-20 ENCOUNTER — Encounter (HOSPITAL_COMMUNITY): Payer: Self-pay

## 2023-10-20 ENCOUNTER — Emergency Department (HOSPITAL_COMMUNITY)
Admission: EM | Admit: 2023-10-20 | Discharge: 2023-10-21 | Disposition: A | Discharge status: Home / Self Care | Payer: 59 | Attending: Emergency Medicine | Admitting: Emergency Medicine

## 2023-10-20 ENCOUNTER — Emergency Department (HOSPITAL_COMMUNITY): Payer: 59

## 2023-10-20 DIAGNOSIS — R0602 Shortness of breath: Secondary | ICD-10-CM | POA: Diagnosis present

## 2023-10-20 DIAGNOSIS — J441 Chronic obstructive pulmonary disease with (acute) exacerbation: Secondary | ICD-10-CM | POA: Insufficient documentation

## 2023-10-20 DIAGNOSIS — Z7901 Long term (current) use of anticoagulants: Secondary | ICD-10-CM | POA: Diagnosis not present

## 2023-10-20 DIAGNOSIS — Z20822 Contact with and (suspected) exposure to covid-19: Secondary | ICD-10-CM | POA: Diagnosis not present

## 2023-10-20 MED ORDER — ALBUTEROL SULFATE HFA 108 (90 BASE) MCG/ACT IN AERS
2.0000 | INHALATION_SPRAY | RESPIRATORY_TRACT | Status: DC | PRN
  Administered 2023-10-21: 2 via RESPIRATORY_TRACT
  Filled 2023-10-20: qty 6.7

## 2023-10-20 NOTE — ED Triage Notes (Signed)
 Hx of lung cancer and COPD. C/o sob. EMS gave albuterol  and solumedrol and pt had relief. Wears 2L [place] at baseline

## 2023-10-21 DIAGNOSIS — J441 Chronic obstructive pulmonary disease with (acute) exacerbation: Secondary | ICD-10-CM | POA: Diagnosis not present

## 2023-10-21 LAB — TROPONIN I (HIGH SENSITIVITY): Troponin I (High Sensitivity): 5 ng/L (ref <18)

## 2023-10-21 LAB — CBC WITH DIFFERENTIAL/PLATELET
Abs Immature Granulocytes: 0.02 10*3/uL (ref 0.00–0.07)
Basophils Absolute: 0 10*3/uL (ref 0.0–0.1)
Basophils Relative: 0 %
Eosinophils Absolute: 0 10*3/uL (ref 0.0–0.5)
Eosinophils Relative: 0 %
HCT: 36.3 % (ref 36.0–46.0)
Hemoglobin: 10.9 g/dL — ABNORMAL LOW (ref 12.0–15.0)
Immature Granulocytes: 0 %
Lymphocytes Relative: 8 %
Lymphs Abs: 0.5 10*3/uL — ABNORMAL LOW (ref 0.7–4.0)
MCH: 29.1 pg (ref 26.0–34.0)
MCHC: 30 g/dL (ref 30.0–36.0)
MCV: 97.1 fL (ref 80.0–100.0)
Monocytes Absolute: 0.1 10*3/uL (ref 0.1–1.0)
Monocytes Relative: 1 %
Neutro Abs: 6 10*3/uL (ref 1.7–7.7)
Neutrophils Relative %: 91 %
Platelets: 283 10*3/uL (ref 150–400)
RBC: 3.74 MIL/uL — ABNORMAL LOW (ref 3.87–5.11)
RDW: 13.5 % (ref 11.5–15.5)
WBC: 6.6 10*3/uL (ref 4.0–10.5)
nRBC: 0 % (ref 0.0–0.2)

## 2023-10-21 LAB — COMPREHENSIVE METABOLIC PANEL
ALT: 14 U/L (ref 0–44)
AST: 15 U/L (ref 15–41)
Albumin: 3.1 g/dL — ABNORMAL LOW (ref 3.5–5.0)
Alkaline Phosphatase: 62 U/L (ref 38–126)
Anion gap: 8 (ref 5–15)
BUN: 18 mg/dL (ref 8–23)
CO2: 33 mmol/L — ABNORMAL HIGH (ref 22–32)
Calcium: 8.3 mg/dL — ABNORMAL LOW (ref 8.9–10.3)
Chloride: 99 mmol/L (ref 98–111)
Creatinine, Ser: 0.41 mg/dL — ABNORMAL LOW (ref 0.44–1.00)
GFR, Estimated: >60 mL/min (ref >60)
Glucose, Bld: 155 mg/dL — ABNORMAL HIGH (ref 70–99)
Potassium: 4 mmol/L (ref 3.5–5.1)
Sodium: 140 mmol/L (ref 135–145)
Total Bilirubin: 0.2 mg/dL (ref 0.0–1.2)
Total Protein: 6.1 g/dL — ABNORMAL LOW (ref 6.5–8.1)

## 2023-10-21 LAB — RESP PANEL BY RT-PCR (RSV, FLU A&B, COVID)  RVPGX2
Influenza A by PCR: NEGATIVE
Influenza B by PCR: NEGATIVE
Resp Syncytial Virus by PCR: NEGATIVE
SARS Coronavirus 2 by RT PCR: NEGATIVE

## 2023-10-21 MED ORDER — ONDANSETRON HCL 4 MG/2ML IJ SOLN
4.0000 mg | Freq: Once | INTRAMUSCULAR | Status: AC
  Administered 2023-10-21: 4 mg via INTRAVENOUS
  Filled 2023-10-21: qty 2

## 2023-10-21 MED ORDER — PREDNISONE 20 MG PO TABS: 40.0000 mg | ORAL_TABLET | Freq: Every day | ORAL | 0 refills | Status: AC

## 2023-10-21 MED ORDER — IPRATROPIUM-ALBUTEROL 0.5-2.5 (3) MG/3ML IN SOLN: 3.0000 mL | Freq: Four times a day (QID) | RESPIRATORY_TRACT | 0 refills | Status: DC | PRN

## 2023-10-21 MED ORDER — IPRATROPIUM-ALBUTEROL 0.5-2.5 (3) MG/3ML IN SOLN
3.0000 mL | Freq: Once | RESPIRATORY_TRACT | Status: AC
  Administered 2023-10-21: 3 mL via RESPIRATORY_TRACT
  Filled 2023-10-21: qty 3

## 2023-10-21 MED ORDER — ALBUTEROL SULFATE (2.5 MG/3ML) 0.083% IN NEBU
2.5000 mg | INHALATION_SOLUTION | Freq: Once | RESPIRATORY_TRACT | Status: AC
  Administered 2023-10-21: 2.5 mg via RESPIRATORY_TRACT
  Filled 2023-10-21: qty 3

## 2023-10-21 MED ORDER — METHYLPREDNISOLONE SODIUM SUCC 125 MG IJ SOLR
125.0000 mg | Freq: Once | INTRAMUSCULAR | Status: AC
  Administered 2023-10-21: 125 mg via INTRAVENOUS
  Filled 2023-10-21: qty 2

## 2023-10-21 MED ORDER — AMOXICILLIN-POT CLAVULANATE 875-125 MG PO TABS: 1.0000 | ORAL_TABLET | Freq: Two times a day (BID) | ORAL | 0 refills | Status: DC

## 2023-10-21 NOTE — ED Provider Notes (Signed)
[place] EMERGENCY DEPARTMENT AT [facility] Provider Note   CSN: [id number] Arrival date & time: 10/20/23  1642     History  No chief complaint on file.   Tracy Park is a 68 y.o. female, history of COPD, lobectomy, who presents to the ED secondary to feeling warm, having chills, and more fatigue for the last day.  She also reports increased sputum, shortness of breath, and cough.  She denies any sick contacts.  Has been compliant with her Eliquis, as she has a history of paroxysmal A-fib.  Denies any chest pain at this time.  She states she feels unwell.  Was given nebulizer by EMS, which she states helped a little bit, but states she is having worsening shortness of breath now.  Is on 2 L O2 typically.  Home Medications Prior to Admission medications   Medication Sig Start Date End Date Taking? Authorizing Provider  amoxicillin-clavulanate (AUGMENTIN) 875-125 MG tablet Take 1 tablet by mouth every 12 (twelve) hours. 10/21/23  Yes Chastidy Ranker L, PA  ipratropium-albuterol (DUONEB) 0.5-2.5 (3) MG/3ML SOLN Take 3 mLs by nebulization every 6 (six) hours as needed. 10/21/23  Yes Tarus Briski L, PA  predniSONE (DELTASONE) 20 MG tablet Take 2 tablets (40 mg total) by mouth daily for 5 days. 10/21/23 10/26/23 Yes Janean Eischen L, PA  albuterol (PROVENTIL) (2.5 MG/3ML) 0.083% nebulizer solution Take 3 mLs (2.5 mg total) by nebulization every 4 (four) hours while awake for 3 days, THEN 3 mLs (2.5 mg total) every 4 (four) hours as needed for wheezing or shortness of breath. 10/18/23 11/20/23  Charlynne Pander, MD  albuterol (VENTOLIN HFA) 108 (90 Base) MCG/ACT inhaler Inhale 2 puffs into the lungs every 4 (four) hours as needed for wheezing or shortness of breath. 10/18/23   Charlynne Pander, MD  apixaban (ELIQUIS) 5 MG TABS tablet Take 1 tablet (5 mg total) by mouth 2 (two) times daily. 06/06/23   Jonah Blue, MD  Budeson-Glycopyrrol-Formoterol (BREZTRI AEROSPHERE) 160-9-4.8  MCG/ACT AERO Inhale 1 puff into the lungs in the morning and at bedtime.    [provider]  levothyroxine (SYNTHROID) 125 MCG tablet Take 125 mcg by mouth daily before breakfast.    [provider]  losartan (COZAAR) 25 MG tablet Take 12.5 mg by mouth daily. 08/29/23   [provider]  magnesium oxide (MAG-OX) 400 (240 Mg) MG tablet Take 400 mg by mouth in the morning.    [provider]  metoprolol succinate (TOPROL-XL) 25 MG 24 hr tablet Take 0.5 tablets (12.5 mg total) by mouth daily. 06/22/23 09/22/23  Narda Bonds, MD  nitroGLYCERIN (NITROSTAT) 0.4 MG SL tablet Place 0.4 mg under the tongue every 5 (five) minutes as needed for chest pain.    [provider]  oxyCODONE (ROXICODONE) 5 MG immediate release tablet Take 1 tablet (5 mg total) by mouth every 6 (six) hours as needed for severe pain (pain score 7-10). 09/12/23   Zadie Rhine, MD  OXYGEN Inhale 2 L/min into the lungs continuous.    [provider]  rosuvastatin (CRESTOR) 20 MG tablet Take 20 mg by mouth daily.    [provider]  sertraline (ZOLOFT) 25 MG tablet Take 25 mg by mouth in the morning.    [provider]  SYSTANE COMPLETE PF 0.6 % SOLN Place 1 drop into both eyes 3 (three) times daily as needed (for dryness).    [provider]  TYLENOL 8 HOUR 650 MG CR  tablet Take 650-1,300 mg by mouth 2 (two) times daily as needed for pain.    [provider]      Allergies    Red dye #40 (allura red), Strawberry extract, Tomato, Aspirin, Tape, and Wound dressing adhesive    Review of Systems   Review of Systems  Constitutional:  Negative for fever.  Respiratory:  Positive for cough, shortness of breath and wheezing.     Physical Exam Updated Vital Signs BP 110/75 (BP Location: Left Arm)   Pulse 73   Temp (!) 97.3 F (36.3 C) (Oral)   Resp 18   Ht 5\' 3"  (1.6 m)   Wt 47.6 kg   SpO2 100%   BMI 18.60 kg/m  Physical Exam Vitals and  nursing note reviewed.  Constitutional:      General: She is not in acute distress.    Appearance: She is well-developed.  HENT:     Head: Normocephalic and atraumatic.  Eyes:     Conjunctiva/sclera: Conjunctivae normal.  Cardiovascular:     Rate and Rhythm: Normal rate and regular rhythm.     Heart sounds: No murmur heard. Pulmonary:     Effort: Tachypnea present.     Comments: Tachypneic, on 2 L of O2 satting around 93%, wheezing throughout.  No nasal flaring.  No retractions Abdominal:     Palpations: Abdomen is soft.     Tenderness: There is no abdominal tenderness.  Musculoskeletal:        General: No swelling.     Cervical back: Neck supple.  Skin:    General: Skin is warm and dry.     Capillary Refill: Capillary refill takes less than 2 seconds.  Neurological:     Mental Status: She is alert.  Psychiatric:        Mood and Affect: Mood normal.     ED Results / Procedures / Treatments   Labs (all labs ordered are listed, but only abnormal results are displayed) Labs Reviewed  CBC WITH DIFFERENTIAL/PLATELET - Abnormal; Notable for the following components:      Result Value   RBC 3.74 (*)    Hemoglobin 10.9 (*)    Lymphs Abs 0.5 (*)    All other components within normal limits  COMPREHENSIVE METABOLIC PANEL - Abnormal; Notable for the following components:   CO2 33 (*)    Glucose, Bld 155 (*)    Creatinine, Ser 0.41 (*)    Calcium 8.3 (*)    Total Protein 6.1 (*)    Albumin 3.1 (*)    All other components within normal limits  RESP PANEL BY RT-PCR (RSV, FLU A&B, COVID)  [id number]  TROPONIN I (HIGH SENSITIVITY)    EKG None  Radiology DG Chest 2 View Result Date: 10/20/2023 CLINICAL DATA:  Shortness of breath. EXAM: CHEST - 2 VIEW COMPARISON:  October 18, 2023 FINDINGS: The heart size and mediastinal contours are within normal limits. Stable moderate severity scarring within the upper left lung with postoperative changes consistent with the patient's history of  prior left upper lobectomy. There is associated volume loss with subsequent right to left shift of superior mediastinal structures. There is no evidence of acute infiltrate, pleural effusion or pneumothorax. Postoperative changes are seen within the lower cervical spine. IMPRESSION: 1. Stable postoperative changes consistent with the patient's history of prior left upper lobectomy. 2. No acute or active cardiopulmonary disease. Electronically Signed   By: Aram Candela M.D.   On: 10/20/2023 18:42    Procedures Procedures  Medications Ordered in ED Medications  albuterol (VENTOLIN HFA) 108 (90 Base) MCG/ACT inhaler 2 puff (2 puffs Inhalation Provided for home use 10/21/23 0319)  ipratropium-albuterol (DUONEB) 0.5-2.5 (3) MG/3ML nebulizer solution 3 mL (3 mLs Nebulization Given 10/21/23 0112)  methylPREDNISolone sodium succinate (SOLU-MEDROL) 125 mg/2 mL injection 125 mg (125 mg Intravenous Given 10/21/23 0112)  ondansetron (ZOFRAN) injection 4 mg (4 mg Intravenous Given 10/21/23 0112)  albuterol (PROVENTIL) (2.5 MG/3ML) 0.083% nebulizer solution 2.5 mg (2.5 mg Nebulization Given 10/21/23 0319)    ED Course/ Medical Decision Making/ A&P                                 Medical Decision Making Patient is a 68 year old female, here worsening shortness of breath, sputum, has been going on for the last couple days.  She is wheezy, and short of breath, on her 2 L of O2, reports that she is feeling warm and having some chills more fatigued for the last day.  We will give her DuoNeb, Solu-Medrol, and reevaluate.  Currently tachypneic, no retractions noted.  Chest x-ray and EKG for further evaluation given age, and shortness of breath  Amount and/or Complexity of Data Reviewed Labs: ordered.    Details: Unremarkable Radiology: ordered.    Details: Chest x-ray shows post lobectomy chronic findings Discussion of management or test interpretation with external provider(s): Discussed with patient,  chest x-ray shows post lobectomy chronic findings, labs are unremarkable, COVID/flu negative.  After 2 breathing treatments, patient breathing much better, not tachypneic anymore, as well as no respiratory distress.  She is refusing to ambulate with pulse ox, check her O2 sats, but states she is feeling better, she was discharged home with likely COPD exacerbation.  Augmentin, prednisone and DuoNebs sent to the pharmacy.  Discussed return precautions and she voiced understanding.  She has been compliant with her Eliquis, and is in remission for her lung cancer  Risk Prescription drug management.    Final Clinical Impression(s) / ED Diagnoses Final diagnoses:  COPD exacerbation (HCC)    Rx / DC Orders ED Discharge Orders          Ordered    amoxicillin-clavulanate (AUGMENTIN) 875-125 MG tablet  Every 12 hours        10/21/23 0254    predniSONE (DELTASONE) 20 MG tablet  Daily        10/21/23 0254    ipratropium-albuterol (DUONEB) 0.5-2.5 (3) MG/3ML SOLN  Every 6 hours PRN        10/21/23 0402              Nicolemarie Wooley, Harley Alto, PA 10/21/23 0549    Tilden Fossa, MD 10/21/23 2337

## 2023-10-21 NOTE — ED Notes (Signed)
Pt confirmed with me she has access to her residence and door is unlocked.

## 2023-10-21 NOTE — Discharge Instructions (Addendum)
You have are having a COPD exacerbation, please take your nebulizers, as instructed.  He is also follow-up with your PCP.  I prescribed you some antibiotics, and some steroids, to help with the inflammation.  I have also prescribed you some nebulizing solution, to use.  Return to the ER if you feel like your symptoms are worsening.  Your workup today was reassuring

## 2023-11-01 ENCOUNTER — Inpatient Hospital Stay (HOSPITAL_COMMUNITY)
Admission: EM | Admit: 2023-11-01 | Discharge: 2023-11-07 | DRG: 191 | Disposition: A | Discharge status: Home / Self Care | Payer: 59 | Attending: Internal Medicine | Admitting: Internal Medicine

## 2023-11-01 ENCOUNTER — Encounter (HOSPITAL_COMMUNITY): Payer: Self-pay | Admitting: Student

## 2023-11-01 ENCOUNTER — Other Ambulatory Visit: Payer: Self-pay

## 2023-11-01 ENCOUNTER — Emergency Department (HOSPITAL_COMMUNITY): Payer: 59

## 2023-11-01 DIAGNOSIS — F419 Anxiety disorder, unspecified: Secondary | ICD-10-CM | POA: Diagnosis present

## 2023-11-01 DIAGNOSIS — Z7951 Long term (current) use of inhaled steroids: Secondary | ICD-10-CM

## 2023-11-01 DIAGNOSIS — Z902 Acquired absence of lung [part of]: Secondary | ICD-10-CM

## 2023-11-01 DIAGNOSIS — F319 Bipolar disorder, unspecified: Secondary | ICD-10-CM | POA: Diagnosis present

## 2023-11-01 DIAGNOSIS — Z7901 Long term (current) use of anticoagulants: Secondary | ICD-10-CM

## 2023-11-01 DIAGNOSIS — R739 Hyperglycemia, unspecified: Secondary | ICD-10-CM | POA: Diagnosis present

## 2023-11-01 DIAGNOSIS — I10 Essential (primary) hypertension: Secondary | ICD-10-CM | POA: Diagnosis present

## 2023-11-01 DIAGNOSIS — J441 Chronic obstructive pulmonary disease with (acute) exacerbation: Principal | ICD-10-CM

## 2023-11-01 DIAGNOSIS — E890 Postprocedural hypothyroidism: Secondary | ICD-10-CM | POA: Diagnosis present

## 2023-11-01 DIAGNOSIS — Z720 Tobacco use: Secondary | ICD-10-CM | POA: Diagnosis present

## 2023-11-01 DIAGNOSIS — Z833 Family history of diabetes mellitus: Secondary | ICD-10-CM

## 2023-11-01 DIAGNOSIS — Z85118 Personal history of other malignant neoplasm of bronchus and lung: Secondary | ICD-10-CM

## 2023-11-01 DIAGNOSIS — Z66 Do not resuscitate: Secondary | ICD-10-CM | POA: Diagnosis present

## 2023-11-01 DIAGNOSIS — I252 Old myocardial infarction: Secondary | ICD-10-CM

## 2023-11-01 DIAGNOSIS — Z9102 Food additives allergy status: Secondary | ICD-10-CM

## 2023-11-01 DIAGNOSIS — I480 Paroxysmal atrial fibrillation: Secondary | ICD-10-CM | POA: Diagnosis present

## 2023-11-01 DIAGNOSIS — Z886 Allergy status to analgesic agent status: Secondary | ICD-10-CM

## 2023-11-01 DIAGNOSIS — T380X5A Adverse effect of glucocorticoids and synthetic analogues, initial encounter: Secondary | ICD-10-CM | POA: Diagnosis present

## 2023-11-01 DIAGNOSIS — Z79890 Hormone replacement therapy (postmenopausal): Secondary | ICD-10-CM

## 2023-11-01 DIAGNOSIS — J9611 Chronic respiratory failure with hypoxia: Secondary | ICD-10-CM | POA: Diagnosis present

## 2023-11-01 DIAGNOSIS — Z79899 Other long term (current) drug therapy: Secondary | ICD-10-CM

## 2023-11-01 DIAGNOSIS — G4730 Sleep apnea, unspecified: Secondary | ICD-10-CM | POA: Diagnosis present

## 2023-11-01 DIAGNOSIS — Z1152 Encounter for screening for COVID-19: Secondary | ICD-10-CM

## 2023-11-01 DIAGNOSIS — Z8249 Family history of ischemic heart disease and other diseases of the circulatory system: Secondary | ICD-10-CM

## 2023-11-01 DIAGNOSIS — E039 Hypothyroidism, unspecified: Secondary | ICD-10-CM | POA: Diagnosis present

## 2023-11-01 DIAGNOSIS — J9621 Acute and chronic respiratory failure with hypoxia: Secondary | ICD-10-CM | POA: Diagnosis present

## 2023-11-01 DIAGNOSIS — Z716 Tobacco abuse counseling: Secondary | ICD-10-CM

## 2023-11-01 DIAGNOSIS — E785 Hyperlipidemia, unspecified: Secondary | ICD-10-CM | POA: Diagnosis present

## 2023-11-01 DIAGNOSIS — F32A Depression, unspecified: Secondary | ICD-10-CM | POA: Diagnosis present

## 2023-11-01 DIAGNOSIS — Z91018 Allergy to other foods: Secondary | ICD-10-CM

## 2023-11-01 DIAGNOSIS — F17210 Nicotine dependence, cigarettes, uncomplicated: Secondary | ICD-10-CM | POA: Diagnosis present

## 2023-11-01 DIAGNOSIS — D72829 Elevated white blood cell count, unspecified: Secondary | ICD-10-CM | POA: Diagnosis present

## 2023-11-01 DIAGNOSIS — Z91048 Other nonmedicinal substance allergy status: Secondary | ICD-10-CM

## 2023-11-01 LAB — CBC WITH DIFFERENTIAL/PLATELET
Abs Immature Granulocytes: 0.03 10*3/uL (ref 0.00–0.07)
Basophils Absolute: 0.1 10*3/uL (ref 0.0–0.1)
Basophils Relative: 0 %
Eosinophils Absolute: 0 10*3/uL (ref 0.0–0.5)
Eosinophils Relative: 0 %
HCT: 39.6 % (ref 36.0–46.0)
Hemoglobin: 12 g/dL (ref 12.0–15.0)
Immature Granulocytes: 0 %
Lymphocytes Relative: 9 %
Lymphs Abs: 1 10*3/uL (ref 0.7–4.0)
MCH: 28.5 pg (ref 26.0–34.0)
MCHC: 30.3 g/dL (ref 30.0–36.0)
MCV: 94.1 fL (ref 80.0–100.0)
Monocytes Absolute: 0.2 10*3/uL (ref 0.1–1.0)
Monocytes Relative: 2 %
Neutro Abs: 10.3 10*3/uL — ABNORMAL HIGH (ref 1.7–7.7)
Neutrophils Relative %: 89 %
Platelets: 213 10*3/uL (ref 150–400)
RBC: 4.21 MIL/uL (ref 3.87–5.11)
RDW: 13 % (ref 11.5–15.5)
WBC: 11.6 10*3/uL — ABNORMAL HIGH (ref 4.0–10.5)
nRBC: 0 % (ref 0.0–0.2)

## 2023-11-01 LAB — BASIC METABOLIC PANEL
Anion gap: 12 (ref 5–15)
BUN: 9 mg/dL (ref 8–23)
CO2: 31 mmol/L (ref 22–32)
Calcium: 8.8 mg/dL — ABNORMAL LOW (ref 8.9–10.3)
Chloride: 98 mmol/L (ref 98–111)
Creatinine, Ser: 0.48 mg/dL (ref 0.44–1.00)
GFR, Estimated: >60 mL/min (ref >60)
Glucose, Bld: 149 mg/dL — ABNORMAL HIGH (ref 70–99)
Potassium: 3.6 mmol/L (ref 3.5–5.1)
Sodium: 141 mmol/L (ref 135–145)

## 2023-11-01 LAB — RESP PANEL BY RT-PCR (RSV, FLU A&B, COVID)  RVPGX2
Influenza A by PCR: NEGATIVE
Influenza B by PCR: NEGATIVE
Resp Syncytial Virus by PCR: NEGATIVE
SARS Coronavirus 2 by RT PCR: NEGATIVE

## 2023-11-01 LAB — TROPONIN I (HIGH SENSITIVITY)
Troponin I (High Sensitivity): 5 ng/L (ref <18)
Troponin I (High Sensitivity): 4 ng/L (ref <18)

## 2023-11-01 LAB — BRAIN NATRIURETIC PEPTIDE: B Natriuretic Peptide: 62.4 pg/mL (ref 0.0–100.0)

## 2023-11-01 MED ORDER — APIXABAN 5 MG PO TABS
5.0000 mg | ORAL_TABLET | Freq: Two times a day (BID) | ORAL | Status: DC
  Administered 2023-11-02 – 2023-11-07 (×11): 5 mg via ORAL
  Filled 2023-11-01 (×11): qty 1

## 2023-11-01 MED ORDER — IPRATROPIUM-ALBUTEROL 0.5-2.5 (3) MG/3ML IN SOLN
3.0000 mL | Freq: Once | RESPIRATORY_TRACT | Status: AC
  Administered 2023-11-01: 3 mL via RESPIRATORY_TRACT
  Filled 2023-11-01: qty 3

## 2023-11-01 MED ORDER — SERTRALINE HCL 50 MG PO TABS
25.0000 mg | ORAL_TABLET | Freq: Every morning | ORAL | Status: DC
  Administered 2023-11-02 – 2023-11-07 (×6): 25 mg via ORAL
  Filled 2023-11-01 (×8): qty 1

## 2023-11-01 MED ORDER — PREDNISONE 20 MG PO TABS
40.0000 mg | ORAL_TABLET | Freq: Every day | ORAL | Status: DC
  Administered 2023-11-02: 40 mg via ORAL
  Filled 2023-11-01: qty 2

## 2023-11-01 MED ORDER — IPRATROPIUM-ALBUTEROL 0.5-2.5 (3) MG/3ML IN SOLN: 3.0000 mL | RESPIRATORY_TRACT | Status: DC

## 2023-11-01 MED ORDER — ACETAMINOPHEN 325 MG PO TABS
650.0000 mg | ORAL_TABLET | Freq: Four times a day (QID) | ORAL | Status: DC | PRN
  Administered 2023-11-03 – 2023-11-05 (×3): 650 mg via ORAL
  Filled 2023-11-01 (×3): qty 2

## 2023-11-01 MED ORDER — UMECLIDINIUM BROMIDE 62.5 MCG/ACT IN AEPB
1.0000 | INHALATION_SPRAY | Freq: Every day | RESPIRATORY_TRACT | Status: DC
  Administered 2023-11-02: 1 via RESPIRATORY_TRACT
  Filled 2023-11-01: qty 7

## 2023-11-01 MED ORDER — KETOROLAC TROMETHAMINE 15 MG/ML IJ SOLN
15.0000 mg | Freq: Once | INTRAMUSCULAR | Status: AC
  Administered 2023-11-01: 15 mg via INTRAVENOUS
  Filled 2023-11-01: qty 1

## 2023-11-01 MED ORDER — LOSARTAN POTASSIUM 25 MG PO TABS
12.5000 mg | ORAL_TABLET | Freq: Every day | ORAL | Status: DC
  Administered 2023-11-02 – 2023-11-07 (×6): 12.5 mg via ORAL
  Filled 2023-11-01 (×6): qty 1

## 2023-11-01 MED ORDER — DEXAMETHASONE SODIUM PHOSPHATE 10 MG/ML IJ SOLN
10.0000 mg | Freq: Once | INTRAMUSCULAR | Status: AC
  Administered 2023-11-01: 10 mg via INTRAVENOUS
  Filled 2023-11-01: qty 1

## 2023-11-01 MED ORDER — NICOTINE 14 MG/24HR TD PT24
14.0000 mg | MEDICATED_PATCH | Freq: Every day | TRANSDERMAL | Status: DC
  Administered 2023-11-02 – 2023-11-05 (×4): 14 mg via TRANSDERMAL
  Filled 2023-11-01 (×6): qty 1

## 2023-11-01 MED ORDER — LEVOTHYROXINE SODIUM 25 MCG PO TABS
125.0000 ug | ORAL_TABLET | Freq: Every day | ORAL | Status: DC
  Administered 2023-11-02 – 2023-11-07 (×6): 125 ug via ORAL
  Filled 2023-11-01 (×6): qty 1

## 2023-11-01 MED ORDER — SODIUM CHLORIDE 0.9 % IV SOLN
500.0000 mg | INTRAVENOUS | Status: AC
  Administered 2023-11-01 – 2023-11-03 (×3): 500 mg via INTRAVENOUS
  Filled 2023-11-01 (×3): qty 5

## 2023-11-01 MED ORDER — ALBUTEROL SULFATE (2.5 MG/3ML) 0.083% IN NEBU
2.5000 mg | INHALATION_SOLUTION | RESPIRATORY_TRACT | Status: DC
  Administered 2023-11-01 – 2023-11-02 (×4): 2.5 mg via RESPIRATORY_TRACT
  Filled 2023-11-01 (×4): qty 3

## 2023-11-01 MED ORDER — ACETAMINOPHEN 650 MG RE SUPP: 650.0000 mg | Freq: Four times a day (QID) | RECTAL | Status: DC | PRN

## 2023-11-01 MED ORDER — MOMETASONE FURO-FORMOTEROL FUM 100-5 MCG/ACT IN AERO
2.0000 | INHALATION_SPRAY | Freq: Two times a day (BID) | RESPIRATORY_TRACT | Status: DC
  Administered 2023-11-02: 2 via RESPIRATORY_TRACT
  Filled 2023-11-01: qty 8.8

## 2023-11-01 MED ORDER — ROSUVASTATIN CALCIUM 20 MG PO TABS
20.0000 mg | ORAL_TABLET | Freq: Every day | ORAL | Status: DC
  Administered 2023-11-02 – 2023-11-07 (×6): 20 mg via ORAL
  Filled 2023-11-01 (×6): qty 1

## 2023-11-01 NOTE — Hospital Course (Signed)
 COPD exac -decadron -duonebs -on 2L at home -tachypnea, wheezing, SHOB  Stage 3 lung cancer s/p resection -in remission

## 2023-11-01 NOTE — ED Notes (Signed)
 ED TO INPATIENT HANDOFF REPORT  ED Nurse Name and Phone #: Aymara Sassi RN  S Name/Age/Gender Tracy Park 68 y.o. female Room/Bed: WA25/WA25  Code Status   Code Status: Limited: Do not attempt resuscitation (DNR) -DNR-LIMITED -Do Not Intubate/DNI   Home/SNF/Other Home Patient oriented to: self, place, time, and situation Is this baseline? Yes   Triage Complete: Triage complete  Chief Complaint COPD with acute exacerbation [facility]) [J44.1]  Triage Note Pt BIB EMS coming from home c/o chest pain and shob that started today. Pain 7/10. Pt has hx of COPD and lung cancer on remission. Had a lobectomy. Normally on 2L  at home,   20 left Forearm, had 3 nebulizer treatment, 15 mg albuterol, 1mg  atrovent, 1.25 solumedrol, 2g mag    Allergies Allergies  Allergen Reactions   Red Dye #40 (Allura Red) Hives, Itching and Other (See Comments)    Red food dye   Strawberry Extract Hives and Itching   Tomato Hives and Itching   Aspirin Hives   Tape Rash and Other (See Comments)    Prefers paper tape   Wound Dressing Adhesive Rash    Level of Care/Admitting Diagnosis ED Disposition     ED Disposition  Admit   Condition  --   Comment  Hospital Area: [facility] [[id number]]  Level of Care: Med-Surg [16]  May place patient in observation at [facility] or [facility] if equivalent level of care is available:: No  Covid Evaluation: Asymptomatic - no recent exposure (last 10 days) testing not required  Diagnosis: COPD with acute exacerbation [facility]) [[id number]]  Admitting Physician: Briscoe Burns [[id number]]  Attending Physician: Briscoe Burns [[id number]]          B Medical/Surgery History Past Medical History:  Diagnosis Date   Anginal pain (HCC)    Anxiety    Bipolar disorder (HCC)    Cancer (HCC)    COPD (chronic obstructive pulmonary disease) (HCC)    Dyspnea    Family history of adverse reaction to anesthesia    History of kidney stones     Hydroureteronephrosis 08/16/2021   Hypothyroidism    Lung cancer (HCC)    Myocardial infarction (HCC)    Paroxysmal atrial fibrillation (HCC)    PTSD (post-traumatic stress disorder)    Sleep apnea    Thyroid disease    Past Surgical History:  Procedure Laterality Date   ABDOMINAL HYSTERECTOMY     BACK SURGERY     CYSTOSCOPY W/ URETERAL STENT PLACEMENT Right 05/03/2021   Procedure: CYSTOSCOPY WITH RETROGRADE PYELOGRAM/URETERAL STENT PLACEMENT;  Surgeon: Crist Fat, MD;  Location: [facility];  Service: Urology;  Laterality: Right;   EYE SURGERY     kidney stent     thyroidectomy       A IV Location/Drains/Wounds Patient Lines/Drains/Airways Status     Active Line/Drains/Airways     Name Placement date Placement time Site Days   Peripheral IV 11/01/23 20 G Left;Posterior Forearm 11/01/23  0000  Forearm  less than 1            Intake/Output Last 24 hours No intake or output data in the 24 hours ending 11/01/23 1641  Labs/Imaging Results for orders placed or performed during the hospital encounter of 11/01/23 (from the past 48 hours)  CBC with Differential     Status: Abnormal   Collection Time: 11/01/23 12:23 PM  Result Value Ref Range   WBC 11.6 (H) 4.0 - 10.5 K/uL   RBC 4.21  3.87 - 5.11 MIL/uL   Hemoglobin 12.0 12.0 - 15.0 g/dL   HCT 82.9 56.2 - 13.0 %   MCV 94.1 80.0 - 100.0 fL   MCH 28.5 26.0 - 34.0 pg   MCHC 30.3 30.0 - 36.0 g/dL   RDW 86.5 78.4 - 69.6 %   Platelets 213 150 - 400 K/uL   nRBC 0.0 0.0 - 0.2 %   Neutrophils Relative % 89 %   Neutro Abs 10.3 (H) 1.7 - 7.7 K/uL   Lymphocytes Relative 9 %   Lymphs Abs 1.0 0.7 - 4.0 K/uL   Monocytes Relative 2 %   Monocytes Absolute 0.2 0.1 - 1.0 K/uL   Eosinophils Relative 0 %   Eosinophils Absolute 0.0 0.0 - 0.5 K/uL   Basophils Relative 0 %   Basophils Absolute 0.1 0.0 - 0.1 K/uL   Immature Granulocytes 0 %   Abs Immature Granulocytes 0.03 0.00 - 0.07 K/uL    Comment: Performed at [facility], [address], [place], [place] [address]  Basic metabolic panel     Status: Abnormal   Collection Time: 11/01/23 12:23 PM  Result Value Ref Range   Sodium 141 135 - 145 mmol/L   Potassium 3.6 3.5 - 5.1 mmol/L   Chloride 98 98 - 111 mmol/L   CO2 31 22 - 32 mmol/L   Glucose, Bld 149 (H) 70 - 99 mg/dL    Comment: Glucose reference range applies only to samples taken after fasting for at least 8 hours.   BUN 9 8 - 23 mg/dL   Creatinine, Ser 4.13 0.44 - 1.00 mg/dL   Calcium 8.8 (L) 8.9 - 10.3 mg/dL   GFR, Estimated >24 >40 mL/min    Comment: (NOTE) Calculated using the CKD-EPI Creatinine Equation (2021)    Anion gap 12 5 - 15    Comment: Performed at [facility], [address], [place], [place] [address]  Troponin I (High Sensitivity)     Status: None   Collection Time: 11/01/23 12:23 PM  Result Value Ref Range   Troponin I (High Sensitivity) 5 <18 ng/L    Comment: (NOTE) Elevated high sensitivity troponin I (hsTnI) values and significant  changes across serial measurements may suggest ACS but many other  chronic and acute conditions are known to elevate hsTnI results.  Refer to the "Links" section for chest pain algorithms and additional  guidance. Performed at [facility], [address] [place], [place] [address]   Brain natriuretic peptide     Status: None   Collection Time: 11/01/23 12:23 PM  Result Value Ref Range   B Natriuretic Peptide 62.4 0.0 - 100.0 pg/mL    Comment: Performed at [facility], [address], [place], [place] [address]  Resp panel by RT-PCR (RSV, Flu A&B, Covid) Anterior Nasal Swab     Status: None   Collection Time: 11/01/23 12:24 PM   Specimen: Anterior Nasal Swab  Result Value Ref Range   SARS Coronavirus 2 by RT PCR NEGATIVE NEGATIVE    Comment: (NOTE) SARS-CoV-2 target nucleic acids are NOT DETECTED.  The SARS-CoV-2 RNA is generally detectable in upper  respiratory specimens during the acute phase of infection. The lowest concentration of SARS-CoV-2 viral copies this assay can detect is 138 copies/mL. A negative result does not preclude SARS-Cov-2 infection and should not be used as the sole basis for treatment or other patient management decisions. A negative result may occur with  improper  specimen collection/handling, submission of specimen other than nasopharyngeal swab, presence of viral mutation(s) within the areas targeted by this assay, and inadequate number of viral copies(<138 copies/mL). A negative result must be combined with clinical observations, patient history, and epidemiological information. The expected result is Negative.  Fact Sheet for Patients:  BloggerCourse.com  Fact Sheet for Healthcare Providers:  SeriousBroker.it  This test is no t yet approved or cleared by the [place] FDA and  has been authorized for detection and/or diagnosis of SARS-CoV-2 by FDA under an Emergency Use Authorization (EUA). This EUA will remain  in effect (meaning this test can be used) for the duration of the COVID-19 declaration under Section 564(b)(1) of the Act, 21 U.S.C.section 360bbb-3(b)(1), unless the authorization is terminated  or revoked sooner.       Influenza A by PCR NEGATIVE NEGATIVE   Influenza B by PCR NEGATIVE NEGATIVE    Comment: (NOTE) The Xpert Xpress SARS-CoV-2/FLU/RSV plus assay is intended as an aid in the diagnosis of influenza from Nasopharyngeal swab specimens and should not be used as a sole basis for treatment. Nasal washings and aspirates are unacceptable for Xpert Xpress SARS-CoV-2/FLU/RSV testing.  Fact Sheet for Patients: BloggerCourse.com  Fact Sheet for Healthcare Providers: SeriousBroker.it  This test is not yet approved or cleared by the [place] FDA and has been authorized for  detection and/or diagnosis of SARS-CoV-2 by FDA under an Emergency Use Authorization (EUA). This EUA will remain in effect (meaning this test can be used) for the duration of the COVID-19 declaration under Section 564(b)(1) of the Act, 21 U.S.C. section 360bbb-3(b)(1), unless the authorization is terminated or revoked.     Resp Syncytial Virus by PCR NEGATIVE NEGATIVE    Comment: (NOTE) Fact Sheet for Patients: BloggerCourse.com  Fact Sheet for Healthcare Providers: SeriousBroker.it  This test is not yet approved or cleared by the [place] FDA and has been authorized for detection and/or diagnosis of SARS-CoV-2 by FDA under an Emergency Use Authorization (EUA). This EUA will remain in effect (meaning this test can be used) for the duration of the COVID-19 declaration under Section 564(b)(1) of the Act, 21 U.S.C. section 360bbb-3(b)(1), unless the authorization is terminated or revoked.  Performed at [facility], [address], [place], [place] [address]   Troponin I (High Sensitivity)     Status: None   Collection Time: 11/01/23  2:55 PM  Result Value Ref Range   Troponin I (High Sensitivity) 4 <18 ng/L    Comment: (NOTE) Elevated high sensitivity troponin I (hsTnI) values and significant  changes across serial measurements may suggest ACS but many other  chronic and acute conditions are known to elevate hsTnI results.  Refer to the "Links" section for chest pain algorithms and additional  guidance. Performed at [facility], [address] [place], [place] [address]    DG Chest 2 View Result Date: 11/01/2023 CLINICAL DATA:  Chest pain, shortness of breath EXAM: CHEST - 2 VIEW COMPARISON:  10/20/2023 FINDINGS: Postsurgical changes to the chest with extensive scarring within the upper left hemithorax with volume loss and leftward shift of the heart and mediastinal structures.  Background of emphysema. No new focal airspace consolidation. No pleural effusion or pneumothorax. Stable heart size. Aortic atherosclerosis. IMPRESSION: Chronic changes without evidence of acute cardiopulmonary disease. Electronically Signed   By: Duanne Guess D.O.   On: 11/01/2023 13:08    Pending Labs Unresulted Labs (From admission, onward)     Start  Ordered   11/02/23 0500  Basic metabolic panel  Tomorrow morning,   R        11/01/23 1624   11/02/23 0500  CBC  Tomorrow morning,   R        11/01/23 1624            Vitals/Pain Today's Vitals   11/01/23 1515 11/01/23 1530 11/01/23 1615 11/01/23 1630  BP: 106/60 (!) 105/58 105/62 (!) 105/59  Pulse: 95 86 (!) 118 84  Resp:  (!) 22 (!) 23 (!) 23  Temp:      TempSrc:      SpO2: 96% 96% 97% 97%  Weight:      Height:      PainSc:        Isolation Precautions No active isolations  Medications Medications  apixaban (ELIQUIS) tablet 5 mg (has no administration in time range)  ipratropium-albuterol (DUONEB) 0.5-2.5 (3) MG/3ML nebulizer solution 3 mL (has no administration in time range)  levothyroxine (SYNTHROID) tablet 125 mcg (has no administration in time range)  losartan (COZAAR) tablet 12.5 mg (has no administration in time range)  rosuvastatin (CRESTOR) tablet 20 mg (has no administration in time range)  sertraline (ZOLOFT) tablet 25 mg (has no administration in time range)  acetaminophen (TYLENOL) tablet 650 mg (has no administration in time range)    Or  acetaminophen (TYLENOL) suppository 650 mg (has no administration in time range)  mometasone-formoterol (DULERA) 100-5 MCG/ACT inhaler 2 puff (has no administration in time range)  umeclidinium bromide (INCRUSE ELLIPTA) 62.5 MCG/ACT 1 puff (has no administration in time range)  nicotine (NICODERM CQ - dosed in mg/24 hours) patch 14 mg (has no administration in time range)  predniSONE (DELTASONE) tablet 40 mg (has no administration in time range)  azithromycin  (ZITHROMAX) 500 mg in sodium chloride 0.9 % 250 mL IVPB (has no administration in time range)  dexamethasone (DECADRON) injection 10 mg (10 mg Intravenous Given 11/01/23 1303)  ipratropium-albuterol (DUONEB) 0.5-2.5 (3) MG/3ML nebulizer solution 3 mL (3 mLs Nebulization Given 11/01/23 1302)  ketorolac (TORADOL) 15 MG/ML injection 15 mg (15 mg Intravenous Given 11/01/23 1517)    Mobility walks with person assist     Focused Assessments Pulmonary Assessment Handoff:  Lung sounds:  Diminished, congested O2 Device: Nasal Cannula O2 Flow Rate (L/min): 2 L/min    R Recommendations: See Admitting Provider Note  Report given to:   Additional Notes:

## 2023-11-01 NOTE — ED Triage Notes (Signed)
 Pt BIB EMS coming from home c/o chest pain and shob that started today. Pain 7/10. Pt has hx of COPD and lung cancer on remission. Had a lobectomy. Normally on 2L [place] at home,   20 left Forearm, had 3 nebulizer treatment, 15 mg albuterol, 1mg  atrovent, 1.25 solumedrol, 2g mag

## 2023-11-01 NOTE — H&P (Signed)
 History and Physical    Patient: Tracy Park WGN:[id number] DOB: [birth year] DOA: 11/01/2023 DOS: the patient was seen and examined on 11/01/2023 PCP: Jacquelin Hawking, PA-C  Patient coming from: Home  Chief Complaint:  Chief Complaint  Patient presents with   Shortness of Breath   HPI: Tracy MONCEAUX is a 68 y.o. female with medical history significant of stage 3 lung cancer s/p left upper lobe resection greater than 5 years ago (in remission), COPD, chronic hypoxic respiratory failure on 2 L [place] at home, anxiety and depression, paroxysmal atrial fibrillation on Eliquis, hypothyroidism, ongoing tobacco use presenting to the ED with shortness of breath and wheezing.  Patient reports that she was doing okay at home until yesterday.  She began having increasing shortness of breath and became very wheezy.  She used her home nebulizer and Breztri but these did not resolve her symptoms.  She states that she subsequently also developed a nonproductive cough.  Her cough has been incessant and has led to some overlying chest soreness.  Given that her symptoms did not improve, she called EMS to bring her to the ED.  She denies any fevers, chills, palpitations, nausea, vomiting, abdominal pain, urinary changes.  She does not recall any recent sick contacts.  ED course: Vital signs stable, saturating well on her home 2 L .  CBC with mild leukocytosis, otherwise unremarkable.  BMP with mild hyperglycemia, otherwise unremarkable.  Troponins negative x 2.  Respiratory panel negative for COVID, flu, RSV.  BNP 62.  Chest x-ray without any focal consolidation.  She was given Decadron in the ED along with 2 rounds of DuoNebs.  Given persistence of wheezing and difficulty moving air, [facility] hospitalist asked to evaluate patient for admission.    Review of Systems: As mentioned in the history of present illness. All other systems reviewed and are negative. Past Medical History:  Diagnosis Date   Anginal  pain (HCC)    Anxiety    Bipolar disorder (HCC)    Cancer (HCC)    COPD (chronic obstructive pulmonary disease) (HCC)    Dyspnea    Family history of adverse reaction to anesthesia    History of kidney stones    Hydroureteronephrosis 08/16/2021   Hypothyroidism    Lung cancer (HCC)    Myocardial infarction (HCC)    Paroxysmal atrial fibrillation (HCC)    PTSD (post-traumatic stress disorder)    Sleep apnea    Thyroid disease    Past Surgical History:  Procedure Laterality Date   ABDOMINAL HYSTERECTOMY     BACK SURGERY     CYSTOSCOPY W/ URETERAL STENT PLACEMENT Right 05/03/2021   Procedure: CYSTOSCOPY WITH RETROGRADE PYELOGRAM/URETERAL STENT PLACEMENT;  Surgeon: Tracy Fat, MD;  Location: [facility];  Service: Urology;  Laterality: Right;   EYE SURGERY     kidney stent     thyroidectomy     Social History:  reports that she has been smoking cigarettes. She has never used smokeless tobacco. She reports that she does not currently use alcohol. She reports that she does not currently use drugs.  Allergies  Allergen Reactions   Red Dye #40 (Allura Red) Hives, Itching and Other (See Comments)    Red food dye   Strawberry Extract Hives and Itching   Tomato Hives and Itching   Aspirin Hives   Tape Rash and Other (See Comments)    Prefers paper tape   Wound Dressing Adhesive Rash    Family History  Problem Relation Age  of Onset   Hypertension Mother    Heart failure Mother    Hypertension Father    Diabetes Father    Heart failure Father     Prior to Admission medications   Medication Sig Start Date End Date Taking? Authorizing Provider  albuterol (PROVENTIL) (2.5 MG/3ML) 0.083% nebulizer solution Take 3 mLs (2.5 mg total) by nebulization every 4 (four) hours while awake for 3 days, THEN 3 mLs (2.5 mg total) every 4 (four) hours as needed for wheezing or shortness of breath. 10/18/23 11/20/23  Charlynne Pander, MD  albuterol (VENTOLIN HFA) 108 (90 Base) MCG/ACT inhaler  Inhale 2 puffs into the lungs every 4 (four) hours as needed for wheezing or shortness of breath. 10/18/23   Charlynne Pander, MD  amoxicillin-clavulanate (AUGMENTIN) 875-125 MG tablet Take 1 tablet by mouth every 12 (twelve) hours. 10/21/23   Small, Brooke L, PA  apixaban (ELIQUIS) 5 MG TABS tablet Take 1 tablet (5 mg total) by mouth 2 (two) times daily. 06/06/23   Jonah Blue, MD  Budeson-Glycopyrrol-Formoterol (BREZTRI AEROSPHERE) 160-9-4.8 MCG/ACT AERO Inhale 1 puff into the lungs in the morning and at bedtime.    [provider]  ipratropium-albuterol (DUONEB) 0.5-2.5 (3) MG/3ML SOLN Take 3 mLs by nebulization every 6 (six) hours as needed. 10/21/23   Small, Brooke L, PA  levothyroxine (SYNTHROID) 125 MCG tablet Take 125 mcg by mouth daily before breakfast.    [provider]  losartan (COZAAR) 25 MG tablet Take 12.5 mg by mouth daily. 08/29/23   [provider]  magnesium oxide (MAG-OX) 400 (240 Mg) MG tablet Take 400 mg by mouth in the morning.    [provider]  metoprolol succinate (TOPROL-XL) 25 MG 24 hr tablet Take 0.5 tablets (12.5 mg total) by mouth daily. 06/22/23 09/22/23  Narda Bonds, MD  nitroGLYCERIN (NITROSTAT) 0.4 MG SL tablet Place 0.4 mg under the tongue every 5 (five) minutes as needed for chest pain.    [provider]  oxyCODONE (ROXICODONE) 5 MG immediate release tablet Take 1 tablet (5 mg total) by mouth every 6 (six) hours as needed for severe pain (pain score 7-10). 09/12/23   Zadie Rhine, MD  OXYGEN Inhale 2 L/min into the lungs continuous.    [provider]  rosuvastatin (CRESTOR) 20 MG tablet Take 20 mg by mouth daily.    [provider]  sertraline (ZOLOFT) 25 MG tablet Take 25 mg by mouth in the morning.    [provider]  SYSTANE COMPLETE PF 0.6 % SOLN Place 1 drop into both eyes 3 (three) times daily as needed (for dryness).    [provider]  TYLENOL 8 HOUR 650 MG CR tablet  Take 650-1,300 mg by mouth 2 (two) times daily as needed for pain.    [provider]    Physical Exam: Vitals:   11/01/23 1305 11/01/23 1510 11/01/23 1515 11/01/23 1530  BP: 114/65  106/60 (!) 105/58  Pulse: 84 99 95 86  Resp: 19 20  (!) 22  Temp:  98.5 F (36.9 C)    TempSrc:  Oral    SpO2: 100% 97% 96% 96%  Weight:      Height:       Physical Exam Constitutional:      Comments: Thin, chronically ill-appearing elderly female  HENT:     Head: Normocephalic and atraumatic.     Mouth/Throat:     Mouth: Mucous membranes are moist.     Pharynx: Oropharynx  is clear.  Eyes:     General: No scleral icterus.    Extraocular Movements: Extraocular movements intact.     Conjunctiva/sclera: Conjunctivae normal.     Pupils: Pupils are equal, round, and reactive to light.  Cardiovascular:     Rate and Rhythm: Normal rate and regular rhythm.     Pulses: Normal pulses.     Heart sounds: Normal heart sounds. No murmur heard.    No friction rub. No gallop.  Pulmonary:     Effort: No tachypnea.     Breath sounds: No rhonchi or rales.     Comments: Diffuse wheezing bilaterally with mild accessory muscle usage. Saturating well on home 2L [place]. Abdominal:     General: Bowel sounds are normal. There is no distension.     Palpations: Abdomen is soft.     Tenderness: There is no abdominal tenderness. There is no guarding or rebound.  Musculoskeletal:        General: Normal range of motion.     Right lower leg: No edema.     Left lower leg: No edema.  Skin:    General: Skin is warm and dry.  Neurological:     General: No focal deficit present.     Mental Status: She is alert and oriented to person, place, and time.  Psychiatric:        Mood and Affect: Mood normal.        Behavior: Behavior normal.     Data Reviewed:  There are no new results to review at this time.     Latest Ref Rng & Units 11/01/2023   12:23 PM 10/21/2023    1:10 AM 10/18/2023    8:25 PM  CBC  WBC 4.0  - 10.5 K/uL 11.6  6.6  11.8   Hemoglobin 12.0 - 15.0 g/dL 32.4  40.1  02.7   Hematocrit 36.0 - 46.0 % 39.6  36.3  40.2   Platelets 150 - 400 K/uL 213  283  265       Latest Ref Rng & Units 11/01/2023   12:23 PM 10/21/2023    1:10 AM 10/18/2023    8:25 PM  CMP  Glucose 70 - 99 mg/dL 253  664  403   BUN 8 - 23 mg/dL 9  18  7    Creatinine 0.44 - 1.00 mg/dL 4.74  2.59  5.63   Sodium 135 - 145 mmol/L 141  140  141   Potassium 3.5 - 5.1 mmol/L 3.6  4.0  3.5   Chloride 98 - 111 mmol/L 98  99  94   CO2 22 - 32 mmol/L 31  33  37   Calcium 8.9 - 10.3 mg/dL 8.8  8.3  8.9   Total Protein 6.5 - 8.1 g/dL  6.1    Total Bilirubin 0.0 - 1.2 mg/dL  0.2    Alkaline Phos 38 - 126 U/L  62    AST 15 - 41 U/L  15    ALT 0 - 44 U/L  14     BNP    Component Value Date/Time   BNP 62.4 11/01/2023 1223    DG Chest 2 View Result Date: 11/01/2023 CLINICAL DATA:  Chest pain, shortness of breath EXAM: CHEST - 2 VIEW COMPARISON:  10/20/2023 FINDINGS: Postsurgical changes to the chest with extensive scarring within the upper left hemithorax with volume loss and leftward shift of the heart and mediastinal structures. Background of emphysema. No new focal airspace consolidation. No pleural  effusion or pneumothorax. Stable heart size. Aortic atherosclerosis. IMPRESSION: Chronic changes without evidence of acute cardiopulmonary disease. Electronically Signed   By: Duanne Guess D.O.   On: 11/01/2023 13:08     Assessment and Plan: No notes have been filed under this hospital service. Service: Hospitalist  COPD exacerbation Chronic hypoxic respiratory failure on 2 L nasal cannula at home Patient presenting with wheezing, nonproductive cough, and shortness of breath with exam findings consistent with COPD exacerbation.  Despite receiving Decadron and DuoNeb's x 2 in the ED, she remains diffusely wheezy and has mild accessory muscle usage on my exam.  Fortunately, she remains saturating well on her home 2 L nasal  cannula.  Negative for COVID, flu, RSV. -Dulera and Incruse Ellipta in place of home Breztri -DuoNebs every 4 hours for 1 day -Prednisone 40 mg for 4 more days -IV azithromycin  -Follow-up full respiratory viral panel -Supplemental O2 as needed to maintain O2 sats greater than 88% -Incentive spirometry -PT/OT eval -Place in observation  Hypertension -Blood pressures ranging in the 100's-110s/60s since arrival to the ED -Resume home losartan 12.5mg  daily from tomorrow -holding home toprol-xl 12.5mg  daily (unclear if still taking)  Paroxysmal atrial fibrillation -Resume home Eliquis -Holding home Toprol-XL 12.5 mg daily (unclear if still taking) -Currently in sinus rhythm and rate controlled  Stage III lung cancer status post left upper lobe resection -Left upper lobe resection greater than 5 years ago -Currently in remission  Hyperlipidemia -Continue home Crestor 20 mg daily  Anxiety and depression -Resume home Zoloft 25 mg daily  Hypothyroidism -Continue Synthroid 125 mcg daily  Ongoing tobacco use -Reports smoking about 5 cigarettes/week, attempting to cut down on her own -Nicotine patch   Advance Care Planning:   Code Status: Limited: Do not attempt resuscitation (DNR) -DNR-LIMITED -Do Not Intubate/DNI Per patient, she has a DNR form at home.  She is AAOx4 and able to make this decision.  Consults: none   Family Communication: no family at bedside  Severity of Illness: The appropriate patient status for this patient is OBSERVATION. Observation status is judged to be reasonable and necessary in order to provide the required intensity of service to ensure the patient's safety. The patient's presenting symptoms, physical exam findings, and initial radiographic and laboratory data in the context of their medical condition is felt to place them at decreased risk for further clinical deterioration. Furthermore, it is anticipated that the patient will be medically stable for  discharge from the hospital within 2 midnights of admission.   Portions of this note were generated with Scientist, clinical (histocompatibility and immunogenetics). Dictation errors may occur despite best attempts at proofreading.  Author: Briscoe Burns, MD 11/01/2023 4:27 PM  For on call review [email or web address].

## 2023-11-01 NOTE — ED Notes (Signed)
 Patient transported to X-ray

## 2023-11-01 NOTE — ED Provider Notes (Signed)
 [place] EMERGENCY DEPARTMENT AT [facility] Provider Note   CSN: [id number] Arrival date & time: 11/01/23  1100     History Chief Complaint  Patient presents with   Shortness of Breath    Tracy Park is a 68 y.o. female patient with history of stage III lung cancer requiring resection currently in remission, COPD, CHF who presents to the emergency department today for further evaluation of shortness of breath and chest pain that started this morning.  Patient is currently on 2 L of oxygen which is at her baseline.  She denies any productive cough, fever, chills, abdominal pain, nausea, vomiting, diarrhea.  She denies any weight gain or leg swelling.   Shortness of Breath      Home Medications Prior to Admission medications   Medication Sig Start Date End Date Taking? Authorizing Provider  albuterol (PROVENTIL) (2.5 MG/3ML) 0.083% nebulizer solution Take 3 mLs (2.5 mg total) by nebulization every 4 (four) hours while awake for 3 days, THEN 3 mLs (2.5 mg total) every 4 (four) hours as needed for wheezing or shortness of breath. 10/18/23 11/20/23  Charlynne Pander, MD  albuterol (VENTOLIN HFA) 108 (90 Base) MCG/ACT inhaler Inhale 2 puffs into the lungs every 4 (four) hours as needed for wheezing or shortness of breath. 10/18/23   Charlynne Pander, MD  amoxicillin-clavulanate (AUGMENTIN) 875-125 MG tablet Take 1 tablet by mouth every 12 (twelve) hours. 10/21/23   Small, Brooke L, PA  apixaban (ELIQUIS) 5 MG TABS tablet Take 1 tablet (5 mg total) by mouth 2 (two) times daily. 06/06/23   Jonah Blue, MD  Budeson-Glycopyrrol-Formoterol (BREZTRI AEROSPHERE) 160-9-4.8 MCG/ACT AERO Inhale 1 puff into the lungs in the morning and at bedtime.    [provider]  ipratropium-albuterol (DUONEB) 0.5-2.5 (3) MG/3ML SOLN Take 3 mLs by nebulization every 6 (six) hours as needed. 10/21/23   Small, Brooke L, PA  levothyroxine (SYNTHROID) 125 MCG tablet Take 125 mcg by mouth  daily before breakfast.    [provider]  losartan (COZAAR) 25 MG tablet Take 12.5 mg by mouth daily. 08/29/23   [provider]  magnesium oxide (MAG-OX) 400 (240 Mg) MG tablet Take 400 mg by mouth in the morning.    [provider]  metoprolol succinate (TOPROL-XL) 25 MG 24 hr tablet Take 0.5 tablets (12.5 mg total) by mouth daily. 06/22/23 09/22/23  Narda Bonds, MD  nitroGLYCERIN (NITROSTAT) 0.4 MG SL tablet Place 0.4 mg under the tongue every 5 (five) minutes as needed for chest pain.    [provider]  oxyCODONE (ROXICODONE) 5 MG immediate release tablet Take 1 tablet (5 mg total) by mouth every 6 (six) hours as needed for severe pain (pain score 7-10). 09/12/23   Zadie Rhine, MD  OXYGEN Inhale 2 L/min into the lungs continuous.    [provider]  rosuvastatin (CRESTOR) 20 MG tablet Take 20 mg by mouth daily.    [provider]  sertraline (ZOLOFT) 25 MG tablet Take 25 mg by mouth in the morning.    [provider]  SYSTANE COMPLETE PF 0.6 % SOLN Place 1 drop into both eyes 3 (three) times daily as needed (for dryness).    [provider]  TYLENOL 8 HOUR 650 MG CR tablet Take 650-1,300 mg by mouth 2 (two) times daily as needed for pain.    [provider]      Allergies    Red dye #40 (allura red), Strawberry extract,  Tomato, Aspirin, Tape, and Wound dressing adhesive    Review of Systems   Review of Systems  Respiratory:  Positive for shortness of breath.   All other systems reviewed and are negative.   Physical Exam Updated Vital Signs BP (!) 105/58   Pulse 86   Temp 98.5 F (36.9 C) (Oral)   Resp (!) 22   Ht 5\' 3"  (1.6 m)   Wt 45.4 kg   SpO2 96%   BMI 17.71 kg/m  Physical Exam Vitals and nursing note reviewed.  Constitutional:      General: She is not in acute distress.    Appearance: Normal appearance.     Comments: Chronically ill appearing and frail.  HENT:     Head:  Normocephalic and atraumatic.  Eyes:     General:        Right eye: No discharge.        Left eye: No discharge.  Cardiovascular:     Comments: Regular rate and rhythm.  S1/S2 are distinct without any evidence of murmur, rubs, or gallops.  Radial pulses are 2+ bilaterally.  Dorsalis pedis pulses are 2+ bilaterally.  No evidence of pedal edema. Pulmonary:     Comments: Expiratory wheeze and decreased breath sounds.  Tachypnea present. Abdominal:     General: Abdomen is flat. Bowel sounds are normal. There is no distension.     Tenderness: There is no abdominal tenderness. There is no guarding or rebound.  Musculoskeletal:        General: Normal range of motion.     Cervical back: Neck supple.  Skin:    General: Skin is warm and dry.     Findings: No rash.  Neurological:     General: No focal deficit present.     Mental Status: She is alert.  Psychiatric:        Mood and Affect: Mood normal.        Behavior: Behavior normal.     ED Results / Procedures / Treatments   Labs (all labs ordered are listed, but only abnormal results are displayed) Labs Reviewed  CBC WITH DIFFERENTIAL/PLATELET - Abnormal; Notable for the following components:      Result Value   WBC 11.6 (*)    Neutro Abs 10.3 (*)    All other components within normal limits  BASIC METABOLIC PANEL - Abnormal; Notable for the following components:   Glucose, Bld 149 (*)    Calcium 8.8 (*)    All other components within normal limits  RESP PANEL BY RT-PCR (RSV, FLU A&B, COVID)  RVPGX2  BRAIN NATRIURETIC PEPTIDE  TROPONIN I (HIGH SENSITIVITY)  TROPONIN I (HIGH SENSITIVITY)    EKG None  Radiology DG Chest 2 View Result Date: 11/01/2023 CLINICAL DATA:  Chest pain, shortness of breath EXAM: CHEST - 2 VIEW COMPARISON:  10/20/2023 FINDINGS: Postsurgical changes to the chest with extensive scarring within the upper left hemithorax with volume loss and leftward shift of the heart and mediastinal structures. Background  of emphysema. No new focal airspace consolidation. No pleural effusion or pneumothorax. Stable heart size. Aortic atherosclerosis. IMPRESSION: Chronic changes without evidence of acute cardiopulmonary disease. Electronically Signed   By: Duanne Guess D.O.   On: 11/01/2023 13:08    Procedures Procedures    Medications Ordered in ED Medications  dexamethasone (DECADRON) injection 10 mg (10 mg Intravenous Given 11/01/23 1303)  ipratropium-albuterol (DUONEB) 0.5-2.5 (3) MG/3ML nebulizer solution 3 mL (3 mLs Nebulization Given 11/01/23 1302)  ketorolac (TORADOL) 15  MG/ML injection 15 mg (15 mg Intravenous Given 11/01/23 1517)    ED Course/ Medical Decision Making/ A&P Clinical Course as of 11/01/23 1611  Sat Nov 01, 2023  1546 On reevaluation, patient still has a good amount of wheezing.  She is currently on 2 L of oxygen but given that the patient has not had any clinical improvement despite steroids and 2 rounds of DuoNebs.  Will likely have to admit for COPD exacerbation. [CF]  1549 CBC with Differential(!) There is evidence of leukocytosis. [CF]  1608 I spoke with Dr. Austin Miles with [facility] hospitalist who agrees to admit the patient.  [CF]  1609 Brain natriuretic peptide Negative.  [CF]  1609 Resp panel by RT-PCR (RSV, Flu A&B, Covid) Anterior Nasal Swab Negative.  [CF]  1609 Troponin I (High Sensitivity) Initial and delta troponin are normal.  [CF]  1610 DG Chest 2 View I personally ordered and interpreted this study and do not see any evidence of pneumonia. I do agree with the radiologist interpretation.  [CF]    Clinical Course User Index [CF] Teressa Lower, PA-C   {   Click here for ABCD2, HEART and other calculators  Medical Decision Making Carlis L Panzer is a 68 y.o. female patient who presents to the emergency department today for further evaluation of shortness of breath and chest pain.  Given the patient's complex medical history differential diagnosis does include  COPD exacerbation, pneumonia, recurrence of lung cancer, CHF although less likely as she does not appear to be fluid overloaded.  Patient is not had a fever and I do feel that pneumonia is less likely this time.  I do favor COPD exacerbation given the way she sounds on exam.  Will plan to get cardiac enzymes, basic labs, chest x-ray, EKG, BNP, give her some DuoNeb treatments and some Decadron and plan to reassess.  Given the lack of clinical improvement despite steroids and multiple rounds of nebulizer treatments I do feel the patient would likely benefit from further evaluation in the hospital.  Will admit for COPD exacerbation.  She is stable for admission at this time.  I will contact the hospitalist for admission.  Amount and/or Complexity of Data Reviewed Labs: ordered. Decision-making details documented in ED Course. Radiology: ordered. Decision-making details documented in ED Course.  Risk Prescription drug management. Decision regarding hospitalization.    Final Clinical Impression(s) / ED Diagnoses Final diagnoses:  COPD exacerbation [facility])    Rx / DC Orders ED Discharge Orders     None         Teressa Lower, [place] 11/01/23 1611    Rolan Bucco, MD 11/02/23 [phone]

## 2023-11-02 DIAGNOSIS — J441 Chronic obstructive pulmonary disease with (acute) exacerbation: Secondary | ICD-10-CM | POA: Diagnosis not present

## 2023-11-02 LAB — BASIC METABOLIC PANEL
Anion gap: 8 (ref 5–15)
BUN: 19 mg/dL (ref 8–23)
CO2: 33 mmol/L — ABNORMAL HIGH (ref 22–32)
Calcium: 8.9 mg/dL (ref 8.9–10.3)
Chloride: 98 mmol/L (ref 98–111)
Creatinine, Ser: 0.51 mg/dL (ref 0.44–1.00)
GFR, Estimated: >60 mL/min (ref >60)
Glucose, Bld: 120 mg/dL — ABNORMAL HIGH (ref 70–99)
Potassium: 3.8 mmol/L (ref 3.5–5.1)
Sodium: 139 mmol/L (ref 135–145)

## 2023-11-02 LAB — RESPIRATORY PANEL BY PCR

## 2023-11-02 LAB — CBC
HCT: 35.4 % — ABNORMAL LOW (ref 36.0–46.0)
Hemoglobin: 10.8 g/dL — ABNORMAL LOW (ref 12.0–15.0)
MCH: 28.6 pg (ref 26.0–34.0)
MCHC: 30.5 g/dL (ref 30.0–36.0)
MCV: 93.7 fL (ref 80.0–100.0)
Platelets: 232 10*3/uL (ref 150–400)
RBC: 3.78 MIL/uL — ABNORMAL LOW (ref 3.87–5.11)
RDW: 12.7 % (ref 11.5–15.5)
WBC: 11.9 10*3/uL — ABNORMAL HIGH (ref 4.0–10.5)
nRBC: 0 % (ref 0.0–0.2)

## 2023-11-02 MED ORDER — IPRATROPIUM-ALBUTEROL 0.5-2.5 (3) MG/3ML IN SOLN
3.0000 mL | Freq: Four times a day (QID) | RESPIRATORY_TRACT | Status: DC
  Administered 2023-11-02 – 2023-11-04 (×9): 3 mL via RESPIRATORY_TRACT
  Filled 2023-11-02 (×9): qty 3

## 2023-11-02 MED ORDER — ALBUTEROL SULFATE (2.5 MG/3ML) 0.083% IN NEBU: 2.5000 mg | INHALATION_SOLUTION | RESPIRATORY_TRACT | Status: DC | PRN

## 2023-11-02 MED ORDER — BUDESONIDE 0.25 MG/2ML IN SUSP
0.2500 mg | Freq: Two times a day (BID) | RESPIRATORY_TRACT | Status: DC
Start: 2023-11-02 — End: 2023-11-07
  Administered 2023-11-02 – 2023-11-07 (×10): 0.25 mg via RESPIRATORY_TRACT
  Filled 2023-11-02 (×10): qty 2

## 2023-11-02 MED ORDER — METHYLPREDNISOLONE SODIUM SUCC 40 MG IJ SOLR
40.0000 mg | Freq: Two times a day (BID) | INTRAMUSCULAR | Status: DC
  Administered 2023-11-02 – 2023-11-05 (×6): 40 mg via INTRAVENOUS
  Filled 2023-11-02 (×6): qty 1

## 2023-11-02 MED ORDER — OXYCODONE HCL 5 MG PO TABS
5.0000 mg | ORAL_TABLET | Freq: Four times a day (QID) | ORAL | Status: DC | PRN
  Administered 2023-11-02 – 2023-11-07 (×7): 5 mg via ORAL
  Filled 2023-11-02 (×7): qty 1

## 2023-11-02 NOTE — TOC Initial Note (Signed)
 Transition of Care [facility]) - Initial/Assessment Note    Patient Details  Name: Tracy Park MRN: [id number] Date of Birth: 10/15/1955  Transition of Care [facility]) CM/SW Contact:    Adrian Prows, RN Phone Number: 11/02/2023, 11:12 AM  Clinical Narrative:                 TOC for d/c planning; spoke w/ pt in room; pt says she lives at home; she plans to return at d/c; pt identified POC dtr Chari Manning [phone]); she will arrange transportation; she verified PCP/insurnace; pt denies SDOH risks; pt says she has walker, shower chair, and grab bars in shower; pt does not have HH services; she has home oxygen w/ [employer]; pt says she needs a travel tank; [facility] at [place] and travel tank will be delivered to pt's room; [facility] will follow; also this RN, CM explained MOON to pt; she verbalized understanding and signed document; copy of MOON given to pt  Expected Discharge Plan: Home/Self Care Barriers to Discharge: Continued Medical Work up   Patient Goals and CMS Choice Patient states their goals for this hospitalization and ongoing recovery are:: home CMS Medicare.gov Compare Post Acute Care list provided to:: Patient        Expected Discharge Plan and Services   Discharge Planning Services: CM Consult   Living arrangements for the past 2 months: Single Family Home                 DME Arranged: Oxygen DME Agency: [employer] Date DME Agency Contacted: 11/02/23 Time DME Agency Contacted: 1110 Representative spoke with at DME Agency: Vaughan Basta            Prior Living Arrangements/Services Living arrangements for the past 2 months: Single Family Home Lives with:: Self Patient language and need for interpreter reviewed:: Yes Do you feel safe going back to the place where you live?: Yes      Need for Family Participation in Patient Care: Yes (Comment) Care giver support system in place?: Yes (comment) Current home services: DME (walker, shower chair, home  oxygen w/ [employer]) Criminal Activity/Legal Involvement Pertinent to Current Situation/Hospitalization: No - Comment as needed  Activities of Daily Living   ADL Screening (condition at time of admission) Independently performs ADLs?: Yes (appropriate for developmental age) Is the patient deaf or have difficulty hearing?: No Does the patient have difficulty seeing, even when wearing glasses/contacts?: No Does the patient have difficulty concentrating, remembering, or making decisions?: No  Permission Sought/Granted Permission sought to share information with : Case Manager Permission granted to share information with : Yes, Verbal Permission Granted  Share Information with NAME: Case Manager     Permission granted to share info w Relationship: Chari Manning (dtr) [phone]     Emotional Assessment Appearance:: Appears stated age Attitude/Demeanor/Rapport: Gracious Affect (typically observed): Accepting Orientation: : Oriented to Self, Oriented to Place, Oriented to  Time, Oriented to Situation Alcohol / Substance Use: Not Applicable Psych Involvement: No (comment)  Admission diagnosis:  COPD exacerbation (HCC) [J44.1] COPD with acute exacerbation (HCC) [J44.1] Patient Active Problem List   Diagnosis Date Noted   COPD with acute exacerbation (HCC) 09/22/2023   History of depression 08/03/2023   Left-sided chest pain 08/03/2023   Rib pain on left side 06/24/2023   Food insecurity 06/05/2023   Left foot pain 04/12/2023   Acute exacerbation of chronic obstructive pulmonary disease (COPD) (HCC) 03/06/2023   Hypocalcemia 03/06/2023   Generalized anxiety disorder 03/06/2023   Anemia  of chronic disease 03/06/2023   Paroxysmal atrial fibrillation (HCC) 02/23/2023   Paroxysmal atrial fibrillation with RVR (HCC) 01/25/2023   Pulmonary nodule 01/25/2023   COPD (chronic obstructive pulmonary disease) (HCC) 01/25/2023   Thrombocytosis 03/26/2022   COPD exacerbation (HCC) 03/25/2022    DDD (degenerative disc disease), cervical 03/24/2022   Bipolar disorder (HCC) 03/24/2022   Septic shock (HCC) 03/14/2022   Sepsis due to pneumonia (HCC) 03/13/2022   Dyslipidemia 03/13/2022   Anxiety and depression 03/13/2022   History of lung cancer 03/13/2022   Tobacco abuse 03/13/2022   Pneumonia 02/27/2022   Adenocarcinoma of lung (HCC) 02/26/2022   Leukocytosis 02/26/2022   Chronic hypoxic respiratory failure (HCC) 02/26/2022   Normocytic anemia 02/08/2022   Protein-calorie malnutrition, severe 08/25/2021   Influenza A with pneumonia 08/16/2021   Sepsis (HCC) 08/16/2021   Hyponatremia 08/16/2021   Hypokalemia 08/16/2021   Hypomagnesemia 08/16/2021   Hydronephrosis 05/03/2021   Acute encephalopathy 05/02/2021   CAP (community acquired pneumonia) 04/10/2020   Coronary artery disease involving native coronary artery of native heart without angina pectoris 08/18/2018   Malignant neoplasm of upper lobe of left lung (HCC) 11/12/2016   Chronic bilateral low back pain without sciatica 08/21/2016   Obstructive sleep apnea (adult) (pediatric) 02/22/2015   Hyperlipidemia 06/03/2013   Vitamin D deficiency 06/03/2013   GERD (gastroesophageal reflux disease) 04/28/2012   Hypothyroidism 04/28/2012   Nicotine abuse 04/28/2012   Chest pain, atypical 04/28/2012   PCP:  Jacquelin Hawking, PA-C Pharmacy:   [id number] - [place], [place] - [address] AT [facility] OF [address] & [address] [place] [address] Phone: [phone] Fax: [phone]  [facility] [address] [place] [address] Phone: [phone] Fax: [phone]  [id number] [place], [place] - [address] [address] [facility] [address] [address] AT [facility] [address] [place] [address] Phone: [phone] Fax: [phone]     Social Drivers of Health (SDOH) Social History: SDOH Screenings   Food Insecurity: No Food  Insecurity (11/02/2023)  Recent Concern: Food Insecurity - Food Insecurity Present (10/08/2023)   Received from [facility]  Housing: Low Risk  (11/02/2023)  Recent Concern: Housing - High Risk (09/23/2023)  Transportation Needs: No Transportation Needs (11/02/2023)  Recent Concern: Transportation Needs - Unmet Transportation Needs (10/08/2023)   Received from [facility]  Utilities: Not At Risk (11/02/2023)  Financial Resource Strain: High Risk (10/08/2023)   Received from [facility]  Physical Activity: Unknown (04/07/2023)   Received from [facility]  Social Connections: Moderately Isolated (11/01/2023)  Stress: Stress Concern Present (04/07/2023)   Received from [facility]  Tobacco Use: High Risk (11/01/2023)   SDOH Interventions: Food Insecurity Interventions: Intervention Not Indicated, Inpatient TOC Housing Interventions: Intervention Not Indicated, Inpatient TOC Transportation Interventions: Intervention Not Indicated, Inpatient TOC Utilities Interventions: Intervention Not Indicated, Inpatient TOC   Readmission Risk Interventions    09/28/2023    4:41 PM 09/24/2023    2:07 PM 08/04/2023    3:04 PM  Readmission Risk Prevention Plan  Transportation Screening Complete Complete Complete  Medication Review Oceanographer) Complete Complete Complete  PCP or Specialist appointment within 3-5 days of discharge Complete Complete Complete  HRI or Home Care Consult Complete Complete Complete  SW Recovery Care/Counseling Consult Complete Complete Complete  Palliative Care Screening Not Applicable Not Applicable Not Applicable  Skilled Nursing Facility Not Applicable Not Applicable Not Applicable

## 2023-11-02 NOTE — Evaluation (Signed)
 Occupational Therapy Evaluation Patient Details Name: Tracy Park MRN: [id number] DOB: 1956/06/08 Today's Date: 11/02/2023   History of Present Illness   Tracy Park is a 68 y.o. female admitted 11/01/23 with SOB and wheezing. Dx with acute COPD exacerbation.  PMH includes stage 3 lung cancer s/p left upper lobe resection greater than 5 years ago (in remission), COPD, chronic hypoxic respiratory failure on 2 L [place] at home, anxiety and depression, paroxysmal atrial fibrillation on Eliquis, hypothyroidism, ongoing tobacco use     Clinical Impressions Pt typically mod I for ADL and mobility. Has DME and home O2 (2L), does cooking and likes to keep busy. Today she was able to demonstrate UB and LB dressing, standing grooming tasks, transfers, mobility at baseline level. During continued standing/walking activity her SpO2 dropped to 86% on 2L and she required 3L to get up to 90%. She does have a home pulse-oximeter and says that "around 90%" is her baseline. Pt with no questions or concerns about self-care or transfers at end of session, recommend that Pt work with mobility team and continue to mobilize multiple times per day while hospitalized to address decreased activity tolerance. OT will sign off at this time and do not anticipate the need for post-acute OT.      If plan is discharge home, recommend the following:   Assist for transportation     Functional Status Assessment   Patient has had a recent decline in their functional status and demonstrates the ability to make significant improvements in function in a reasonable and predictable amount of time.     Equipment Recommendations   None recommended by OT (has appropriate DME)     Recommendations for Other Services         Precautions/Restrictions   Precautions Precaution/Restrictions Comments: watch O2 Restrictions Weight Bearing Restrictions Per Provider Order: No     Mobility Bed Mobility Overal bed  mobility: Modified Independent                  Transfers Overall transfer level: Modified independent Equipment used: None                      Balance Overall balance assessment: No apparent balance deficits (not formally assessed)                                         ADL either performed or assessed with clinical judgement   ADL Overall ADL's : At baseline                                       General ADL Comments: Pt able to demonstrate UB and LB ADL, standing tasks for ADL and hallway mobility     Vision Ability to See in Adequate Light: 0 Adequate Patient Visual Report: No change from baseline Vision Assessment?: No apparent visual deficits     Perception         Praxis         Pertinent Vitals/Pain Pain Assessment Pain Assessment: 0-10 Pain Score: 8  Pain Location: generalized Pain Descriptors / Indicators: Discomfort, Constant Pain Intervention(s): Monitored during session, Repositioned     Extremity/Trunk Assessment Upper Extremity Assessment Upper Extremity Assessment: Overall WFL for tasks assessed   Lower Extremity Assessment Lower Extremity Assessment:  Defer to PT evaluation   Cervical / Trunk Assessment Cervical / Trunk Assessment: Normal   Communication Communication Communication: No apparent difficulties   Cognition Arousal: Alert Behavior During Therapy: WFL for tasks assessed/performed Cognition: No apparent impairments             OT - Cognition Comments: funny :) good attitude towards therapy                 Following commands: Intact       Cueing  General Comments          Exercises     Shoulder Instructions      Home Living Family/patient expects to be discharged to:: Private residence Living Arrangements: Other relatives Available Help at Discharge: Family;Available 24 hours/day Type of Home: Mobile home Home Access: Stairs to enter Entrance  Stairs-Number of Steps: 3 Entrance Stairs-Rails: Right;Left;Can reach both Home Layout: One level     Bathroom Shower/Tub: Chief Strategy Officer: Standard     Home Equipment: Cane - single point;Rollator (4 wheels);Shower seat;Grab bars - tub/shower;Hand held shower head   Additional Comments: On 2 L O2 at home 24/7      Prior Functioning/Environment Prior Level of Function : Independent/Modified Independent             Mobility Comments: In home ambulated wtihout AD; in community would use AD; denies falls ADLs Comments: Ind with ADLs/selfcare, IADLs, home mgt, cooking, no longer drives bc she does not have a car; can go out in community but typically family does shopping    OT Problem List: Decreased activity tolerance;Cardiopulmonary status limiting activity;Pain   OT Treatment/Interventions:        OT Goals(Current goals can be found in the care plan section)   Acute Rehab OT Goals Patient Stated Goal: get up and move OT Goal Formulation: With patient Time For Goal Achievement: 11/16/23 Potential to Achieve Goals: Good   OT Frequency:       Co-evaluation              AM-PAC OT "6 Clicks" Daily Activity     Outcome Measure Help from another person eating meals?: None Help from another person taking care of personal grooming?: None Help from another person toileting, which includes using toliet, bedpan, or urinal?: None Help from another person bathing (including washing, rinsing, drying)?: None Help from another person to put on and taking off regular upper body clothing?: None Help from another person to put on and taking off regular lower body clothing?: None 6 Click Score: 24   End of Session Equipment Utilized During Treatment: Gait belt;Oxygen (2-3L) Nurse Communication: Mobility status  Activity Tolerance: Patient tolerated treatment well Patient left: Other (comment) (walking with PT in hallway)  OT Visit Diagnosis: Muscle  weakness (generalized) (M62.81) (cardioopulmonary status limiting activity)                Time: 1610-9604 OT Time Calculation (min): 30 min Charges:  OT General Charges $OT Visit: 1 Visit OT Evaluation $OT Eval Low Complexity: 1 Low  Nyoka Cowden OTR/L Acute Rehabilitation Services Office: [phone]  Emelda Fear 11/02/2023, 2:42 PM

## 2023-11-02 NOTE — Progress Notes (Signed)
 PROGRESS NOTE    Tracy Park  QMV:[id number] DOB: 07/25/1956 DOA: 11/01/2023 PCP: Jacquelin Hawking, PA-C  Outpatient Specialists:     Brief Narrative:  Patient is a 68 year old female, with past medical history significant for stage III lung cancer status post resection of the left upper lobe, COPD, chronic respiratory failure on 2 L of supplemental oxygen, paroxysmal atrial fibrillation on Eliquis, hypothyroidism, anxiety, and depression and ongoing tobacco use disorder.  Patient was admitted with COPD exacerbation and acute on chronic hypoxic respiratory failure.  11/02/2023: Patient continues to wheeze significantly, and still short of breath.  Will change oral prednisone to IV Solu-Medrol.  Will add nebs Pulmicort.   Assessment & Plan:   Principal Problem:   COPD with acute exacerbation (HCC) Active Problems:   Hypothyroidism   Nicotine abuse   Chronic hypoxic respiratory failure (HCC)   Anxiety and depression   History of lung cancer   Paroxysmal atrial fibrillation (HCC)   COPD exacerbation Chronic hypoxic respiratory failure on 2 L nasal cannula at home Patient presenting with wheezing, nonproductive cough, and shortness of breath with exam findings consistent with COPD exacerbation.  Despite receiving Decadron and DuoNeb's x 2 in the ED, she remains diffusely wheezy and has mild accessory muscle usage on my exam.  Fortunately, she remains saturating well on her home 2 L nasal cannula.  Negative for COVID, flu, RSV. -Patient continues to wheeze significantly. -Change prednisone to IV Solu-Medrol. -Start nebs Pulmicort. -Incentive spirometry. -Flutter valve device therapy. -Nebs DuoNeb's. -Continue supplemental oxygen.   Hypertension -Blood pressures ranging in the 100's-110s/60s since arrival to the ED -Resume home losartan 12.5mg  daily from tomorrow -holding home toprol-xl 12.5mg  daily (unclear if still taking)   Paroxysmal atrial fibrillation -Continue  Eliquis.   -Heart rate is currently controlled.   Stage III lung cancer status post left upper lobe resection -Left upper lobe resection greater than 5 years ago -Currently in remission   Hyperlipidemia -Continue home Crestor 20 mg daily   Anxiety and depression -Resume home Zoloft 25 mg daily   Hypothyroidism -Continue Synthroid 125 mcg daily   Ongoing tobacco use -Reports smoking about 5 cigarettes/week, attempting to cut down on her own -Nicotine patch -Counseled to quit tobacco use.  DVT prophylaxis: Apixaban. Code Status: DO NOT RESUSCITATE. Family Communication:  Disposition Plan:    Consultants:  None.  Procedures:  None.  Antimicrobials:  IV azithromycin.   Subjective: Fullness of breath. Wheezing.  Objective: Vitals:   11/02/23 0119 11/02/23 0418 11/02/23 0543 11/02/23 0821  BP: 92/67  (!) 102/56   Pulse: 89  82   Resp: 17  18   Temp: 98.1 F (36.7 C)  98.1 F (36.7 C)   TempSrc: Oral  Oral   SpO2: 96% 92% 97% 96%  Weight:      Height:        Intake/Output Summary (Last 24 hours) at 11/02/2023 0916 Last data filed at 11/02/2023 0542 Gross per 24 hour  Intake 1200 ml  Output --  Net 1200 ml   Filed Weights   11/01/23 1123 11/01/23 1715  Weight: 45.4 kg 43.1 kg    Examination:  General exam: Patient is short of breath.  Patient is thin.  Chronically ill looking.   Respiratory system: Decreased air entry with expiratory wheeze.     Cardiovascular system: S1 & S2 heard Gastrointestinal system: Abdomen is nondistended, soft and nontender.  Central nervous system: Awake and alert.  Patient moves all extremities.   Extremities: No  leg edema.  Data Reviewed: I have personally reviewed following labs and imaging studies  CBC: Recent Labs  Lab 11/01/23 1223 11/02/23 0505  WBC 11.6* 11.9*  NEUTROABS 10.3*  --   HGB 12.0 10.8*  HCT 39.6 35.4*  MCV 94.1 93.7  PLT 213 232   Basic Metabolic Panel: Recent Labs  Lab 11/01/23 1223  11/02/23 0505  NA 141 139  K 3.6 3.8  CL 98 98  CO2 31 33*  GLUCOSE 149* 120*  BUN 9 19  CREATININE 0.48 0.51  CALCIUM 8.8* 8.9   GFR: Estimated Creatinine Clearance: 47.7 mL/min (by C-G formula based on SCr of 0.51 mg/dL). Liver Function Tests: No results for input(s): "AST", "ALT", "ALKPHOS", "BILITOT", "PROT", "ALBUMIN" in the last 168 hours. No results for input(s): "LIPASE", "AMYLASE" in the last 168 hours. No results for input(s): "AMMONIA" in the last 168 hours. Coagulation Profile: No results for input(s): "INR", "PROTIME" in the last 168 hours. Cardiac Enzymes: No results for input(s): "CKTOTAL", "CKMB", "CKMBINDEX", "TROPONINI" in the last 168 hours. BNP (last 3 results) No results for input(s): "PROBNP" in the last 8760 hours. HbA1C: No results for input(s): "HGBA1C" in the last 72 hours. CBG: No results for input(s): "GLUCAP" in the last 168 hours. Lipid Profile: No results for input(s): "CHOL", "HDL", "LDLCALC", "TRIG", "CHOLHDL", "LDLDIRECT" in the last 72 hours. Thyroid Function Tests: No results for input(s): "TSH", "T4TOTAL", "FREET4", "T3FREE", "THYROIDAB" in the last 72 hours. Anemia Panel: No results for input(s): "VITAMINB12", "FOLATE", "FERRITIN", "TIBC", "IRON", "RETICCTPCT" in the last 72 hours. Urine analysis:    Component Value Date/Time   COLORURINE YELLOW 06/25/2023 0536   APPEARANCEUR CLEAR 06/25/2023 0536   LABSPEC 1.023 06/25/2023 0536   PHURINE 6.0 06/25/2023 0536   GLUCOSEU 50 (A) 06/25/2023 0536   HGBUR SMALL (A) 06/25/2023 0536   BILIRUBINUR NEGATIVE 06/25/2023 0536   KETONESUR NEGATIVE 06/25/2023 0536   PROTEINUR NEGATIVE 06/25/2023 0536   UROBILINOGEN 0.2 11/06/2009 1559   NITRITE NEGATIVE 06/25/2023 0536   LEUKOCYTESUR NEGATIVE 06/25/2023 0536   Sepsis Labs: @LABRCNTIP (procalcitonin:4,lacticidven:4)  ) Recent Results (from the past 240 hours)  Resp panel by RT-PCR (RSV, Flu A&B, Covid) Anterior Nasal Swab     Status: None    Collection Time: 11/01/23 12:24 PM   Specimen: Anterior Nasal Swab  Result Value Ref Range Status   SARS Coronavirus 2 by RT PCR NEGATIVE NEGATIVE Final    Comment: (NOTE) SARS-CoV-2 target nucleic acids are NOT DETECTED.  The SARS-CoV-2 RNA is generally detectable in upper respiratory specimens during the acute phase of infection. The lowest concentration of SARS-CoV-2 viral copies this assay can detect is 138 copies/mL. A negative result does not preclude SARS-Cov-2 infection and should not be used as the sole basis for treatment or other patient management decisions. A negative result may occur with  improper specimen collection/handling, submission of specimen other than nasopharyngeal swab, presence of viral mutation(s) within the areas targeted by this assay, and inadequate number of viral copies(<138 copies/mL). A negative result must be combined with clinical observations, patient history, and epidemiological information. The expected result is Negative.  Fact Sheet for Patients:  BloggerCourse.com  Fact Sheet for Healthcare Providers:  SeriousBroker.it  This test is no t yet approved or cleared by the [place] FDA and  has been authorized for detection and/or diagnosis of SARS-CoV-2 by FDA under an Emergency Use Authorization (EUA). This EUA will remain  in effect (meaning this test can be used) for the duration of the  COVID-19 declaration under Section 564(b)(1) of the Act, 21 U.S.C.section 360bbb-3(b)(1), unless the authorization is terminated  or revoked sooner.       Influenza A by PCR NEGATIVE NEGATIVE Final   Influenza B by PCR NEGATIVE NEGATIVE Final    Comment: (NOTE) The Xpert Xpress SARS-CoV-2/FLU/RSV plus assay is intended as an aid in the diagnosis of influenza from Nasopharyngeal swab specimens and should not be used as a sole basis for treatment. Nasal washings and aspirates are unacceptable for  Xpert Xpress SARS-CoV-2/FLU/RSV testing.  Fact Sheet for Patients: BloggerCourse.com  Fact Sheet for Healthcare Providers: SeriousBroker.it  This test is not yet approved or cleared by the [place] FDA and has been authorized for detection and/or diagnosis of SARS-CoV-2 by FDA under an Emergency Use Authorization (EUA). This EUA will remain in effect (meaning this test can be used) for the duration of the COVID-19 declaration under Section 564(b)(1) of the Act, 21 U.S.C. section 360bbb-3(b)(1), unless the authorization is terminated or revoked.     Resp Syncytial Virus by PCR NEGATIVE NEGATIVE Final    Comment: (NOTE) Fact Sheet for Patients: BloggerCourse.com  Fact Sheet for Healthcare Providers: SeriousBroker.it  This test is not yet approved or cleared by the [place] FDA and has been authorized for detection and/or diagnosis of SARS-CoV-2 by FDA under an Emergency Use Authorization (EUA). This EUA will remain in effect (meaning this test can be used) for the duration of the COVID-19 declaration under Section 564(b)(1) of the Act, 21 U.S.C. section 360bbb-3(b)(1), unless the authorization is terminated or revoked.  Performed at [facility], [address] [place], [place] [address]   Respiratory (~20 pathogens) panel by PCR     Status: None   Collection Time: 11/01/23 12:24 PM   Specimen: Nasopharyngeal Swab; Respiratory  Result Value Ref Range Status   Adenovirus NOT DETECTED NOT DETECTED Final   Coronavirus 229E NOT DETECTED NOT DETECTED Final    Comment: (NOTE) The Coronavirus on the Respiratory Panel, DOES NOT test for the novel  Coronavirus (2019 nCoV)    Coronavirus HKU1 NOT DETECTED NOT DETECTED Final   Coronavirus NL63 NOT DETECTED NOT DETECTED Final   Coronavirus OC43 NOT DETECTED NOT DETECTED Final   Metapneumovirus NOT  DETECTED NOT DETECTED Final   Rhinovirus / Enterovirus NOT DETECTED NOT DETECTED Final   Influenza A NOT DETECTED NOT DETECTED Final   Influenza B NOT DETECTED NOT DETECTED Final   Parainfluenza Virus 1 NOT DETECTED NOT DETECTED Final   Parainfluenza Virus 2 NOT DETECTED NOT DETECTED Final   Parainfluenza Virus 3 NOT DETECTED NOT DETECTED Final   Parainfluenza Virus 4 NOT DETECTED NOT DETECTED Final   Respiratory Syncytial Virus NOT DETECTED NOT DETECTED Final   Bordetella pertussis NOT DETECTED NOT DETECTED Final   Bordetella Parapertussis NOT DETECTED NOT DETECTED Final   Chlamydophila pneumoniae NOT DETECTED NOT DETECTED Final   Mycoplasma pneumoniae NOT DETECTED NOT DETECTED Final    Comment: Performed at [facility], [address], [place], [place] [address]         Radiology Studies: DG Chest 2 View Result Date: 11/01/2023 CLINICAL DATA:  Chest pain, shortness of breath EXAM: CHEST - 2 VIEW COMPARISON:  10/20/2023 FINDINGS: Postsurgical changes to the chest with extensive scarring within the upper left hemithorax with volume loss and leftward shift of the heart and mediastinal structures. Background of emphysema. No new focal airspace consolidation. No pleural effusion or pneumothorax. Stable heart size. Aortic atherosclerosis. IMPRESSION: Chronic  changes without evidence of acute cardiopulmonary disease. Electronically Signed   By: Duanne Guess D.O.   On: 11/01/2023 13:08        Scheduled Meds:  albuterol  2.5 mg Nebulization Q4H   apixaban  5 mg Oral BID   levothyroxine  125 mcg Oral QAC breakfast   losartan  12.5 mg Oral Daily   mometasone-formoterol  2 puff Inhalation BID   nicotine  14 mg Transdermal Daily   predniSONE  40 mg Oral Q breakfast   rosuvastatin  20 mg Oral Daily   sertraline  25 mg Oral q AM   umeclidinium bromide  1 puff Inhalation Daily   Continuous Infusions:  azithromycin 500 mg (11/01/23 2027)     LOS: 0 days    Time spent: 35  minutes.    Berton Mount, MD  [facility] Pager #: [phone] 7PM-7AM contact night coverage as above

## 2023-11-02 NOTE — Care Management Obs Status (Signed)
 MEDICARE OBSERVATION STATUS NOTIFICATION   Patient Details  Name: Tracy Park MRN: [id number] Date of Birth: 10/30/1955   Medicare Observation Status Notification Given:  Yes    Adrian Prows, RN 11/02/2023, 11:08 AM

## 2023-11-02 NOTE — Evaluation (Signed)
 Physical Therapy Evaluation Patient Details Name: Tracy Park MRN: [id number] DOB: 1956/07/10 Today's Date: 11/02/2023  History of Present Illness  Tracy Park is a 68 y.o. female admitted 11/01/23 with SOB and wheezing. Dx with acute COPD exacerbation.  PMH includes stage 3 lung cancer s/p left upper lobe resection greater than 5 years ago (in remission), COPD, chronic hypoxic respiratory failure on 2 L [place] at home, anxiety and depression, paroxysmal atrial fibrillation on Eliquis, hypothyroidism, ongoing tobacco use  Clinical Impression  Pt admitted as above and currently demonstrating ability to perform all basic mobility tasks including ambulation in halls at MOD IND/IND level.  Pt nearing PLOF and PT service will sign off to mobility team for duration of hospital stay.        If plan is discharge home, recommend the following:     Can travel by private vehicle        Equipment Recommendations None recommended by PT  Recommendations for Other Services       Functional Status Assessment Patient has not had a recent decline in their functional status     Precautions / Restrictions Precautions Precaution/Restrictions Comments: watch O2 - home O2 @2L  Restrictions Weight Bearing Restrictions Per Provider Order: No      Mobility  Bed Mobility Overal bed mobility: Modified Independent                  Transfers Overall transfer level: Modified independent Equipment used: None                    Ambulation/Gait Ambulation/Gait assistance: Independent Gait Distance (Feet): 150 Feet (150' twice with seated rest break between) Assistive device: None Gait Pattern/deviations: WFL(Within Functional Limits) Gait velocity: mod pace     General Gait Details: cues for pacing  Stairs            Wheelchair Mobility     Tilt Bed    Modified Rankin (Stroke Patients Only)       Balance Overall balance assessment: No apparent balance deficits  (not formally assessed)                                           Pertinent Vitals/Pain Pain Assessment Pain Assessment: 0-10 Pain Score: 8  Pain Location: generalized Pain Descriptors / Indicators: Discomfort, Constant Pain Intervention(s): Limited activity within patient's tolerance, Monitored during session, Patient requesting pain meds-RN notified    Home Living Family/patient expects to be discharged to:: Private residence Living Arrangements: Other relatives Available Help at Discharge: Family;Available 24 hours/day Type of Home: Mobile home Home Access: Stairs to enter Entrance Stairs-Rails: Right;Left;Can reach both Entrance Stairs-Number of Steps: 3   Home Layout: One level Home Equipment: Cane - single point;Rollator (4 wheels);Shower seat;Grab bars - tub/shower;Hand held shower head Additional Comments: On 2 L O2 at home 24/7    Prior Function Prior Level of Function : Independent/Modified Independent             Mobility Comments: In home ambulated wtihout AD; in community would use AD; denies falls ADLs Comments: Ind with ADLs/selfcare, IADLs, home mgt, cooking, no longer drives bc she does not have a car; can go out in community but typically family does shopping     Extremity/Trunk Assessment   Upper Extremity Assessment Upper Extremity Assessment: Overall WFL for tasks assessed    Lower Extremity Assessment  Lower Extremity Assessment: Overall WFL for tasks assessed       Communication   Communication Communication: No apparent difficulties    Cognition Arousal: Alert Behavior During Therapy: WFL for tasks assessed/performed   PT - Cognitive impairments: No apparent impairments                         Following commands: Intact       Cueing Cueing Techniques: Verbal cues     General Comments General comments (skin integrity, edema, etc.): 2L O2    Exercises     Assessment/Plan    PT Assessment Patient  does not need any further PT services  PT Problem List         PT Treatment Interventions      PT Goals (Current goals can be found in the Care Plan section)  Acute Rehab PT Goals Patient Stated Goal: HOME PT Goal Formulation: All assessment and education complete, DC therapy    Frequency       Co-evaluation               AM-PAC PT "6 Clicks" Mobility  Outcome Measure Help needed turning from your back to your side while in a flat bed without using bedrails?: None Help needed moving from lying on your back to sitting on the side of a flat bed without using bedrails?: None Help needed moving to and from a bed to a chair (including a wheelchair)?: None Help needed standing up from a chair using your arms (e.g., wheelchair or bedside chair)?: None Help needed to walk in hospital room?: None Help needed climbing 3-5 steps with a railing? : None 6 Click Score: 24    End of Session Equipment Utilized During Treatment: Gait belt Activity Tolerance: Patient tolerated treatment well Patient left: in chair;with call bell/phone within reach;with chair alarm set Nurse Communication: Mobility status PT Visit Diagnosis: Difficulty in walking, not elsewhere classified (R26.2)    Time: 1340-1411 PT Time Calculation (min) (ACUTE ONLY): 31 min   Charges:   PT Evaluation $PT Eval Low Complexity: 1 Low   PT General Charges $$ ACUTE PT VISIT: 1 Visit         Mauro Kaufmann PT Acute Rehabilitation Services Pager [phone] Office [phone]   Daryon Remmert 11/02/2023, 5:25 PM

## 2023-11-03 DIAGNOSIS — I480 Paroxysmal atrial fibrillation: Secondary | ICD-10-CM | POA: Diagnosis present

## 2023-11-03 DIAGNOSIS — F419 Anxiety disorder, unspecified: Secondary | ICD-10-CM | POA: Diagnosis present

## 2023-11-03 DIAGNOSIS — Z1152 Encounter for screening for COVID-19: Secondary | ICD-10-CM | POA: Diagnosis not present

## 2023-11-03 DIAGNOSIS — Z7901 Long term (current) use of anticoagulants: Secondary | ICD-10-CM | POA: Diagnosis not present

## 2023-11-03 DIAGNOSIS — Z66 Do not resuscitate: Secondary | ICD-10-CM | POA: Diagnosis present

## 2023-11-03 DIAGNOSIS — R739 Hyperglycemia, unspecified: Secondary | ICD-10-CM | POA: Diagnosis present

## 2023-11-03 DIAGNOSIS — Z902 Acquired absence of lung [part of]: Secondary | ICD-10-CM | POA: Diagnosis not present

## 2023-11-03 DIAGNOSIS — J9611 Chronic respiratory failure with hypoxia: Secondary | ICD-10-CM | POA: Diagnosis present

## 2023-11-03 DIAGNOSIS — Z79899 Other long term (current) drug therapy: Secondary | ICD-10-CM | POA: Diagnosis not present

## 2023-11-03 DIAGNOSIS — G4730 Sleep apnea, unspecified: Secondary | ICD-10-CM | POA: Diagnosis present

## 2023-11-03 DIAGNOSIS — I10 Essential (primary) hypertension: Secondary | ICD-10-CM | POA: Diagnosis present

## 2023-11-03 DIAGNOSIS — F319 Bipolar disorder, unspecified: Secondary | ICD-10-CM | POA: Diagnosis present

## 2023-11-03 DIAGNOSIS — Z79890 Hormone replacement therapy (postmenopausal): Secondary | ICD-10-CM | POA: Diagnosis not present

## 2023-11-03 DIAGNOSIS — F17210 Nicotine dependence, cigarettes, uncomplicated: Secondary | ICD-10-CM | POA: Diagnosis present

## 2023-11-03 DIAGNOSIS — Z716 Tobacco abuse counseling: Secondary | ICD-10-CM | POA: Diagnosis not present

## 2023-11-03 DIAGNOSIS — E890 Postprocedural hypothyroidism: Secondary | ICD-10-CM | POA: Diagnosis present

## 2023-11-03 DIAGNOSIS — T380X5A Adverse effect of glucocorticoids and synthetic analogues, initial encounter: Secondary | ICD-10-CM | POA: Diagnosis present

## 2023-11-03 DIAGNOSIS — Z8249 Family history of ischemic heart disease and other diseases of the circulatory system: Secondary | ICD-10-CM | POA: Diagnosis not present

## 2023-11-03 DIAGNOSIS — Z9102 Food additives allergy status: Secondary | ICD-10-CM | POA: Diagnosis not present

## 2023-11-03 DIAGNOSIS — J441 Chronic obstructive pulmonary disease with (acute) exacerbation: Secondary | ICD-10-CM | POA: Diagnosis present

## 2023-11-03 DIAGNOSIS — D72829 Elevated white blood cell count, unspecified: Secondary | ICD-10-CM | POA: Diagnosis present

## 2023-11-03 DIAGNOSIS — Z85118 Personal history of other malignant neoplasm of bronchus and lung: Secondary | ICD-10-CM | POA: Diagnosis not present

## 2023-11-03 DIAGNOSIS — E785 Hyperlipidemia, unspecified: Secondary | ICD-10-CM | POA: Diagnosis present

## 2023-11-03 DIAGNOSIS — I252 Old myocardial infarction: Secondary | ICD-10-CM | POA: Diagnosis not present

## 2023-11-03 MED ORDER — POLYETHYLENE GLYCOL 3350 17 G PO PACK: 17.0000 g | PACK | Freq: Every day | ORAL | Status: DC | PRN

## 2023-11-03 MED ORDER — IPRATROPIUM-ALBUTEROL 0.5-2.5 (3) MG/3ML IN SOLN: 3.0000 mL | RESPIRATORY_TRACT | Status: DC | PRN

## 2023-11-03 MED ORDER — METOPROLOL TARTRATE 5 MG/5ML IV SOLN: 5.0000 mg | INTRAVENOUS | Status: DC | PRN

## 2023-11-03 MED ORDER — HYDRALAZINE HCL 20 MG/ML IJ SOLN: 10.0000 mg | INTRAMUSCULAR | Status: DC | PRN

## 2023-11-03 MED ORDER — SENNOSIDES-DOCUSATE SODIUM 8.6-50 MG PO TABS: 1.0000 | ORAL_TABLET | Freq: Every evening | ORAL | Status: DC | PRN

## 2023-11-03 MED ORDER — ONDANSETRON HCL 4 MG/2ML IJ SOLN: 4.0000 mg | Freq: Four times a day (QID) | INTRAMUSCULAR | Status: DC | PRN

## 2023-11-03 NOTE — Plan of Care (Signed)
   Problem: Education: Goal: Knowledge of General Education information will improve Description: Including pain rating scale, medication(s)/side effects and non-pharmacologic comfort measures Outcome: Progressing   Problem: Activity: Goal: Risk for activity intolerance will decrease Outcome: Progressing

## 2023-11-03 NOTE — Progress Notes (Signed)
 PROGRESS NOTE    Tracy Park  XBJ:[id number] DOB: 10-19-1955 DOA: 11/01/2023 PCP: Jacquelin Hawking, PA-C    Brief Narrative:   68 year old female, with past medical history significant for stage III lung cancer status post resection of the left upper lobe, COPD, chronic respiratory failure on 2 L of supplemental oxygen, paroxysmal atrial fibrillation on Eliquis, hypothyroidism, anxiety, and depression and ongoing tobacco use disorder. Patient was admitted with COPD exacerbation and acute on chronic hypoxic respiratory failure.   Assessment & Plan:  Principal Problem:   COPD with acute exacerbation (HCC) Active Problems:   Chronic hypoxic respiratory failure (HCC)   Paroxysmal atrial fibrillation (HCC)   Hypothyroidism   History of lung cancer   Nicotine abuse   Anxiety and depression     COPD exacerbation Chronic hypoxic respiratory failure on 2 L nasal cannula at home  Negative for COVID, flu, RSV. -Continue steroids, bronchodilators, I-S/flutter valve. -Empiric azithromycin   Hypertension Currently on losartan.  IV as needed   Paroxysmal atrial fibrillation -Continue Eliquis.   -Heart rate is currently controlled.   Stage III lung cancer status post left upper lobe resection -Left upper lobe resection greater than 5 years ago -Currently in remission   Hyperlipidemia Daily Crestor   Anxiety and depression Zoloft   Hypothyroidism Synthroid   Ongoing tobacco use -Reports smoking about 5 cigarettes/week, attempting to cut down on her own -Nicotine patch -Counseled to quit tobacco use.   DVT prophylaxis: Apixaban. Code Status: DO NOT RESUSCITATE. Family Communication:  Disposition Plan: Likely will be in the hospital for at least next 2-3 days   Subjective: Still having exertional dyspnea and diffuse wheezing.   Examination:  General exam: Appears calm and comfortable  Respiratory system: Bilateral diffuse inspiratory and expiratory  wheezing Cardiovascular system: S1 & S2 heard, RRR. No JVD, murmurs, rubs, gallops or clicks. No pedal edema. Gastrointestinal system: Abdomen is nondistended, soft and nontender. No organomegaly or masses felt. Normal bowel sounds heard. Central nervous system: Alert and oriented. No focal neurological deficits. Extremities: Symmetric 5 x 5 power. Skin: No rashes, lesions or ulcers Psychiatry: Judgement and insight appear normal. Mood & affect appropriate.                Diet Orders (From admission, onward)     Start     Ordered   11/01/23 1624  Diet Heart Room service appropriate? Yes; Fluid consistency: Thin  Diet effective now       Question Answer Comment  Room service appropriate? Yes   Fluid consistency: Thin      11/01/23 1624            Objective: Vitals:   11/02/23 2218 11/03/23 0302 11/03/23 0550 11/03/23 0751  BP:   101/72   Pulse:   67   Resp:   15   Temp:   97.7 F (36.5 C)   TempSrc:   Oral   SpO2: 97% 98% 99% 99%  Weight:      Height:        Intake/Output Summary (Last 24 hours) at 11/03/2023 1307 Last data filed at 11/03/2023 1200 Gross per 24 hour  Intake 1110.25 ml  Output 0 ml  Net 1110.25 ml   Filed Weights   11/01/23 1123 11/01/23 1715  Weight: 45.4 kg 43.1 kg    Scheduled Meds:  apixaban  5 mg Oral BID   budesonide (PULMICORT) nebulizer solution  0.25 mg Nebulization BID   ipratropium-albuterol  3 mL Nebulization Q6H  levothyroxine  125 mcg Oral QAC breakfast   losartan  12.5 mg Oral Daily   methylPREDNISolone (SOLU-MEDROL) injection  40 mg Intravenous Q12H   nicotine  14 mg Transdermal Daily   rosuvastatin  20 mg Oral Daily   sertraline  25 mg Oral q AM   Continuous Infusions:  azithromycin 500 mg (11/02/23 2039)    Nutritional status     Body mass index is 16.83 kg/m.  Data Reviewed:   CBC: Recent Labs  Lab 11/01/23 1223 11/02/23 0505  WBC 11.6* 11.9*  NEUTROABS 10.3*  --   HGB 12.0 10.8*  HCT 39.6  35.4*  MCV 94.1 93.7  PLT 213 232   Basic Metabolic Panel: Recent Labs  Lab 11/01/23 1223 11/02/23 0505  NA 141 139  K 3.6 3.8  CL 98 98  CO2 31 33*  GLUCOSE 149* 120*  BUN 9 19  CREATININE 0.48 0.51  CALCIUM 8.8* 8.9   GFR: Estimated Creatinine Clearance: 47.7 mL/min (by C-G formula based on SCr of 0.51 mg/dL). Liver Function Tests: No results for input(s): "AST", "ALT", "ALKPHOS", "BILITOT", "PROT", "ALBUMIN" in the last 168 hours. No results for input(s): "LIPASE", "AMYLASE" in the last 168 hours. No results for input(s): "AMMONIA" in the last 168 hours. Coagulation Profile: No results for input(s): "INR", "PROTIME" in the last 168 hours. Cardiac Enzymes: No results for input(s): "CKTOTAL", "CKMB", "CKMBINDEX", "TROPONINI" in the last 168 hours. BNP (last 3 results) No results for input(s): "PROBNP" in the last 8760 hours. HbA1C: No results for input(s): "HGBA1C" in the last 72 hours. CBG: No results for input(s): "GLUCAP" in the last 168 hours. Lipid Profile: No results for input(s): "CHOL", "HDL", "LDLCALC", "TRIG", "CHOLHDL", "LDLDIRECT" in the last 72 hours. Thyroid Function Tests: No results for input(s): "TSH", "T4TOTAL", "FREET4", "T3FREE", "THYROIDAB" in the last 72 hours. Anemia Panel: No results for input(s): "VITAMINB12", "FOLATE", "FERRITIN", "TIBC", "IRON", "RETICCTPCT" in the last 72 hours. Sepsis Labs: No results for input(s): "PROCALCITON", "LATICACIDVEN" in the last 168 hours.  Recent Results (from the past 240 hours)  Resp panel by RT-PCR (RSV, Flu A&B, Covid) Anterior Nasal Swab     Status: None   Collection Time: 11/01/23 12:24 PM   Specimen: Anterior Nasal Swab  Result Value Ref Range Status   SARS Coronavirus 2 by RT PCR NEGATIVE NEGATIVE Final    Comment: (NOTE) SARS-CoV-2 target nucleic acids are NOT DETECTED.  The SARS-CoV-2 RNA is generally detectable in upper respiratory specimens during the acute phase of infection. The  lowest concentration of SARS-CoV-2 viral copies this assay can detect is 138 copies/mL. A negative result does not preclude SARS-Cov-2 infection and should not be used as the sole basis for treatment or other patient management decisions. A negative result may occur with  improper specimen collection/handling, submission of specimen other than nasopharyngeal swab, presence of viral mutation(s) within the areas targeted by this assay, and inadequate number of viral copies(<138 copies/mL). A negative result must be combined with clinical observations, patient history, and epidemiological information. The expected result is Negative.  Fact Sheet for Patients:  BloggerCourse.com  Fact Sheet for Healthcare Providers:  SeriousBroker.it  This test is no t yet approved or cleared by the [place] FDA and  has been authorized for detection and/or diagnosis of SARS-CoV-2 by FDA under an Emergency Use Authorization (EUA). This EUA will remain  in effect (meaning this test can be used) for the duration of the COVID-19 declaration under Section 564(b)(1) of the Act, 21 U.S.C.section  360bbb-3(b)(1), unless the authorization is terminated  or revoked sooner.       Influenza A by PCR NEGATIVE NEGATIVE Final   Influenza B by PCR NEGATIVE NEGATIVE Final    Comment: (NOTE) The Xpert Xpress SARS-CoV-2/FLU/RSV plus assay is intended as an aid in the diagnosis of influenza from Nasopharyngeal swab specimens and should not be used as a sole basis for treatment. Nasal washings and aspirates are unacceptable for Xpert Xpress SARS-CoV-2/FLU/RSV testing.  Fact Sheet for Patients: BloggerCourse.com  Fact Sheet for Healthcare Providers: SeriousBroker.it  This test is not yet approved or cleared by the [place] FDA and has been authorized for detection and/or diagnosis of SARS-CoV-2 by FDA under  an Emergency Use Authorization (EUA). This EUA will remain in effect (meaning this test can be used) for the duration of the COVID-19 declaration under Section 564(b)(1) of the Act, 21 U.S.C. section 360bbb-3(b)(1), unless the authorization is terminated or revoked.     Resp Syncytial Virus by PCR NEGATIVE NEGATIVE Final    Comment: (NOTE) Fact Sheet for Patients: BloggerCourse.com  Fact Sheet for Healthcare Providers: SeriousBroker.it  This test is not yet approved or cleared by the [place] FDA and has been authorized for detection and/or diagnosis of SARS-CoV-2 by FDA under an Emergency Use Authorization (EUA). This EUA will remain in effect (meaning this test can be used) for the duration of the COVID-19 declaration under Section 564(b)(1) of the Act, 21 U.S.C. section 360bbb-3(b)(1), unless the authorization is terminated or revoked.  Performed at [facility], [address], [place], [place] [address]   Respiratory (~20 pathogens) panel by PCR     Status: None   Collection Time: 11/01/23 12:24 PM   Specimen: Nasopharyngeal Swab; Respiratory  Result Value Ref Range Status   Adenovirus NOT DETECTED NOT DETECTED Final   Coronavirus 229E NOT DETECTED NOT DETECTED Final    Comment: (NOTE) The Coronavirus on the Respiratory Panel, DOES NOT test for the novel  Coronavirus (2019 nCoV)    Coronavirus HKU1 NOT DETECTED NOT DETECTED Final   Coronavirus NL63 NOT DETECTED NOT DETECTED Final   Coronavirus OC43 NOT DETECTED NOT DETECTED Final   Metapneumovirus NOT DETECTED NOT DETECTED Final   Rhinovirus / Enterovirus NOT DETECTED NOT DETECTED Final   Influenza A NOT DETECTED NOT DETECTED Final   Influenza B NOT DETECTED NOT DETECTED Final   Parainfluenza Virus 1 NOT DETECTED NOT DETECTED Final   Parainfluenza Virus 2 NOT DETECTED NOT DETECTED Final   Parainfluenza Virus 3 NOT DETECTED NOT DETECTED Final    Parainfluenza Virus 4 NOT DETECTED NOT DETECTED Final   Respiratory Syncytial Virus NOT DETECTED NOT DETECTED Final   Bordetella pertussis NOT DETECTED NOT DETECTED Final   Bordetella Parapertussis NOT DETECTED NOT DETECTED Final   Chlamydophila pneumoniae NOT DETECTED NOT DETECTED Final   Mycoplasma pneumoniae NOT DETECTED NOT DETECTED Final    Comment: Performed at [facility], [address], [place], [place] [address]         Radiology Studies: No results found.         LOS: 0 days   Time spent= 35 mins    Miguel Rota, MD [facility]  If 7PM-7AM, please contact night-coverage  11/03/2023, 1:07 PM

## 2023-11-03 NOTE — Plan of Care (Signed)
   Problem: Education: Goal: Knowledge of General Education information will improve Description Including pain rating scale, medication(s)/side effects and non-pharmacologic comfort measures Outcome: Progressing

## 2023-11-04 DIAGNOSIS — J441 Chronic obstructive pulmonary disease with (acute) exacerbation: Secondary | ICD-10-CM | POA: Diagnosis not present

## 2023-11-04 LAB — CBC
HCT: 34.5 % — ABNORMAL LOW (ref 36.0–46.0)
Hemoglobin: 10.7 g/dL — ABNORMAL LOW (ref 12.0–15.0)
MCH: 29.4 pg (ref 26.0–34.0)
MCHC: 31 g/dL (ref 30.0–36.0)
MCV: 94.8 fL (ref 80.0–100.0)
Platelets: 219 10*3/uL (ref 150–400)
RBC: 3.64 MIL/uL — ABNORMAL LOW (ref 3.87–5.11)
RDW: 13.4 % (ref 11.5–15.5)
WBC: 11.2 10*3/uL — ABNORMAL HIGH (ref 4.0–10.5)
nRBC: 0 % (ref 0.0–0.2)

## 2023-11-04 LAB — BASIC METABOLIC PANEL
Anion gap: 9 (ref 5–15)
BUN: 28 mg/dL — ABNORMAL HIGH (ref 8–23)
CO2: 30 mmol/L (ref 22–32)
Calcium: 8.6 mg/dL — ABNORMAL LOW (ref 8.9–10.3)
Chloride: 103 mmol/L (ref 98–111)
Creatinine, Ser: 0.59 mg/dL (ref 0.44–1.00)
GFR, Estimated: >60 mL/min (ref >60)
Glucose, Bld: 84 mg/dL (ref 70–99)
Potassium: 3.7 mmol/L (ref 3.5–5.1)
Sodium: 142 mmol/L (ref 135–145)

## 2023-11-04 LAB — PHOSPHORUS: Phosphorus: 2.6 mg/dL (ref 2.5–4.6)

## 2023-11-04 LAB — MAGNESIUM: Magnesium: 1.9 mg/dL (ref 1.7–2.4)

## 2023-11-04 MED ORDER — POTASSIUM CHLORIDE CRYS ER 20 MEQ PO TBCR
40.0000 meq | EXTENDED_RELEASE_TABLET | Freq: Once | ORAL | Status: AC
  Administered 2023-11-04: 40 meq via ORAL
  Filled 2023-11-04: qty 2

## 2023-11-04 MED ORDER — MAGNESIUM OXIDE -MG SUPPLEMENT 400 (240 MG) MG PO TABS
400.0000 mg | ORAL_TABLET | Freq: Once | ORAL | Status: AC
  Administered 2023-11-04: 400 mg via ORAL
  Filled 2023-11-04: qty 1

## 2023-11-04 NOTE — Progress Notes (Signed)
 PROGRESS NOTE    Tracy Park  ZOX:[id number] DOB: 04-May-1956 DOA: 11/01/2023 PCP: Jacquelin Hawking, PA-C    Brief Narrative:   68 year old female, with past medical history significant for stage III lung cancer status post resection of the left upper lobe, COPD, chronic respiratory failure on 2 L of supplemental oxygen, paroxysmal atrial fibrillation on Eliquis, hypothyroidism, anxiety, and depression and ongoing tobacco use disorder. Patient was admitted with COPD exacerbation and acute on chronic hypoxic respiratory failure.   Assessment & Plan:  Principal Problem:   COPD with acute exacerbation (HCC) Active Problems:   Chronic hypoxic respiratory failure (HCC)   Paroxysmal atrial fibrillation (HCC)   Hypothyroidism   History of lung cancer   Nicotine abuse   Anxiety and depression     COPD exacerbation Chronic hypoxic respiratory failure on 2 L nasal cannula at home  Negative for COVID, flu, RSV.  Diffuse abnormal breath sounds -Continue steroids, bronchodilators, I-S/flutter valve. - Azithromycin 500 mg 3 days completed   Hypertension Currently on losartan.  IV as needed   Paroxysmal atrial fibrillation -Continue Eliquis.   -Heart rate is currently controlled.   Stage III lung cancer status post left upper lobe resection -Left upper lobe resection greater than 5 years ago -Currently in remission   Hyperlipidemia Daily Crestor   Anxiety and depression Zoloft   Hypothyroidism Synthroid   Ongoing tobacco use -Reports smoking about 5 cigarettes/week, attempting to cut down on her own -Nicotine patch -Counseled to quit tobacco use.   DVT prophylaxis: Eliquis Code Status: DO NOT RESUSCITATE. Family Communication:  Disposition Plan: Likely will be in the hospital for at least next 2-3 days   Subjective: Doing ok. Still coughing.   Examination:  General exam: Appears calm and comfortable  Respiratory system: Bilateral diffuse inspiratory and  expiratory wheezing Cardiovascular system: S1 & S2 heard, RRR. No JVD, murmurs, rubs, gallops or clicks. No pedal edema. Gastrointestinal system: Abdomen is nondistended, soft and nontender. No organomegaly or masses felt. Normal bowel sounds heard. Central nervous system: Alert and oriented. No focal neurological deficits. Extremities: Symmetric 5 x 5 power. Skin: No rashes, lesions or ulcers Psychiatry: Judgement and insight appear normal. Mood & affect appropriate.                Diet Orders (From admission, onward)     Start     Ordered   11/01/23 1624  Diet Heart Room service appropriate? Yes; Fluid consistency: Thin  Diet effective now       Question Answer Comment  Room service appropriate? Yes   Fluid consistency: Thin      11/01/23 1624            Objective: Vitals:   11/04/23 0133 11/04/23 0547 11/04/23 0721 11/04/23 1001  BP:  110/77 115/74   Pulse:  71 73   Resp:  15 17   Temp:  97.9 F (36.6 C) 98.3 F (36.8 C)   TempSrc:  Oral Oral   SpO2: 97% 99% 99% 98%  Weight:      Height:        Intake/Output Summary (Last 24 hours) at 11/04/2023 1128 Last data filed at 11/04/2023 1026 Gross per 24 hour  Intake 2065.19 ml  Output 0 ml  Net 2065.19 ml   Filed Weights   11/01/23 1123 11/01/23 1715  Weight: 45.4 kg 43.1 kg    Scheduled Meds:  apixaban  5 mg Oral BID   budesonide (PULMICORT) nebulizer solution  0.25 mg Nebulization  BID   ipratropium-albuterol  3 mL Nebulization Q6H   levothyroxine  125 mcg Oral QAC breakfast   losartan  12.5 mg Oral Daily   methylPREDNISolone (SOLU-MEDROL) injection  40 mg Intravenous Q12H   nicotine  14 mg Transdermal Daily   rosuvastatin  20 mg Oral Daily   sertraline  25 mg Oral q AM   Continuous Infusions:  Nutritional status     Body mass index is 16.83 kg/m.  Data Reviewed:   CBC: Recent Labs  Lab 11/01/23 1223 11/02/23 0505 11/04/23 0453  WBC 11.6* 11.9* 11.2*  NEUTROABS 10.3*  --   --    HGB 12.0 10.8* 10.7*  HCT 39.6 35.4* 34.5*  MCV 94.1 93.7 94.8  PLT 213 232 219   Basic Metabolic Panel: Recent Labs  Lab 11/01/23 1223 11/02/23 0505 11/04/23 0453  NA 141 139 142  K 3.6 3.8 3.7  CL 98 98 103  CO2 31 33* 30  GLUCOSE 149* 120* 84  BUN 9 19 28*  CREATININE 0.48 0.51 0.59  CALCIUM 8.8* 8.9 8.6*  MG  --   --  1.9  PHOS  --   --  2.6   GFR: Estimated Creatinine Clearance: 47.7 mL/min (by C-G formula based on SCr of 0.59 mg/dL). Liver Function Tests: No results for input(s): "AST", "ALT", "ALKPHOS", "BILITOT", "PROT", "ALBUMIN" in the last 168 hours. No results for input(s): "LIPASE", "AMYLASE" in the last 168 hours. No results for input(s): "AMMONIA" in the last 168 hours. Coagulation Profile: No results for input(s): "INR", "PROTIME" in the last 168 hours. Cardiac Enzymes: No results for input(s): "CKTOTAL", "CKMB", "CKMBINDEX", "TROPONINI" in the last 168 hours. BNP (last 3 results) No results for input(s): "PROBNP" in the last 8760 hours. HbA1C: No results for input(s): "HGBA1C" in the last 72 hours. CBG: No results for input(s): "GLUCAP" in the last 168 hours. Lipid Profile: No results for input(s): "CHOL", "HDL", "LDLCALC", "TRIG", "CHOLHDL", "LDLDIRECT" in the last 72 hours. Thyroid Function Tests: No results for input(s): "TSH", "T4TOTAL", "FREET4", "T3FREE", "THYROIDAB" in the last 72 hours. Anemia Panel: No results for input(s): "VITAMINB12", "FOLATE", "FERRITIN", "TIBC", "IRON", "RETICCTPCT" in the last 72 hours. Sepsis Labs: No results for input(s): "PROCALCITON", "LATICACIDVEN" in the last 168 hours.  Recent Results (from the past 240 hours)  Resp panel by RT-PCR (RSV, Flu A&B, Covid) Anterior Nasal Swab     Status: None   Collection Time: 11/01/23 12:24 PM   Specimen: Anterior Nasal Swab  Result Value Ref Range Status   SARS Coronavirus 2 by RT PCR NEGATIVE NEGATIVE Final    Comment: (NOTE) SARS-CoV-2 target nucleic acids are NOT  DETECTED.  The SARS-CoV-2 RNA is generally detectable in upper respiratory specimens during the acute phase of infection. The lowest concentration of SARS-CoV-2 viral copies this assay can detect is 138 copies/mL. A negative result does not preclude SARS-Cov-2 infection and should not be used as the sole basis for treatment or other patient management decisions. A negative result may occur with  improper specimen collection/handling, submission of specimen other than nasopharyngeal swab, presence of viral mutation(s) within the areas targeted by this assay, and inadequate number of viral copies(<138 copies/mL). A negative result must be combined with clinical observations, patient history, and epidemiological information. The expected result is Negative.  Fact Sheet for Patients:  BloggerCourse.com  Fact Sheet for Healthcare Providers:  SeriousBroker.it  This test is no t yet approved or cleared by the [place] and  has been authorized for  detection and/or diagnosis of SARS-CoV-2 by FDA under an Emergency Use Authorization (EUA). This EUA will remain  in effect (meaning this test can be used) for the duration of the COVID-19 declaration under Section 564(b)(1) of the Act, 21 U.S.C.section 360bbb-3(b)(1), unless the authorization is terminated  or revoked sooner.       Influenza A by PCR NEGATIVE NEGATIVE Final   Influenza B by PCR NEGATIVE NEGATIVE Final    Comment: (NOTE) The Xpert Xpress SARS-CoV-2/FLU/RSV plus assay is intended as an aid in the diagnosis of influenza from Nasopharyngeal swab specimens and should not be used as a sole basis for treatment. Nasal washings and aspirates are unacceptable for Xpert Xpress SARS-CoV-2/FLU/RSV testing.  Fact Sheet for Patients: BloggerCourse.com  Fact Sheet for Healthcare Providers: SeriousBroker.it  This test is not yet  approved or cleared by the [place] FDA and has been authorized for detection and/or diagnosis of SARS-CoV-2 by FDA under an Emergency Use Authorization (EUA). This EUA will remain in effect (meaning this test can be used) for the duration of the COVID-19 declaration under Section 564(b)(1) of the Act, 21 U.S.C. section 360bbb-3(b)(1), unless the authorization is terminated or revoked.     Resp Syncytial Virus by PCR NEGATIVE NEGATIVE Final    Comment: (NOTE) Fact Sheet for Patients: BloggerCourse.com  Fact Sheet for Healthcare Providers: SeriousBroker.it  This test is not yet approved or cleared by the [place] FDA and has been authorized for detection and/or diagnosis of SARS-CoV-2 by FDA under an Emergency Use Authorization (EUA). This EUA will remain in effect (meaning this test can be used) for the duration of the COVID-19 declaration under Section 564(b)(1) of the Act, 21 U.S.C. section 360bbb-3(b)(1), unless the authorization is terminated or revoked.  Performed at [facility], [address] [place], [place] [address]   Respiratory (~20 pathogens) panel by PCR     Status: None   Collection Time: 11/01/23 12:24 PM   Specimen: Nasopharyngeal Swab; Respiratory  Result Value Ref Range Status   Adenovirus NOT DETECTED NOT DETECTED Final   Coronavirus 229E NOT DETECTED NOT DETECTED Final    Comment: (NOTE) The Coronavirus on the Respiratory Panel, DOES NOT test for the novel  Coronavirus (2019 nCoV)    Coronavirus HKU1 NOT DETECTED NOT DETECTED Final   Coronavirus NL63 NOT DETECTED NOT DETECTED Final   Coronavirus OC43 NOT DETECTED NOT DETECTED Final   Metapneumovirus NOT DETECTED NOT DETECTED Final   Rhinovirus / Enterovirus NOT DETECTED NOT DETECTED Final   Influenza A NOT DETECTED NOT DETECTED Final   Influenza B NOT DETECTED NOT DETECTED Final   Parainfluenza Virus 1 NOT DETECTED NOT  DETECTED Final   Parainfluenza Virus 2 NOT DETECTED NOT DETECTED Final   Parainfluenza Virus 3 NOT DETECTED NOT DETECTED Final   Parainfluenza Virus 4 NOT DETECTED NOT DETECTED Final   Respiratory Syncytial Virus NOT DETECTED NOT DETECTED Final   Bordetella pertussis NOT DETECTED NOT DETECTED Final   Bordetella Parapertussis NOT DETECTED NOT DETECTED Final   Chlamydophila pneumoniae NOT DETECTED NOT DETECTED Final   Mycoplasma pneumoniae NOT DETECTED NOT DETECTED Final    Comment: Performed at [facility], [address] [place], [place] [address]         Radiology Studies: No results found.         LOS: 1 day   Time spent= 35 mins    Miguel Rota, MD [facility]  If 7PM-7AM, please contact night-coverage  11/04/2023, 11:28  AM

## 2023-11-04 NOTE — Plan of Care (Signed)
   Problem: Education: Goal: Knowledge of General Education information will improve Description: Including pain rating scale, medication(s)/side effects and non-pharmacologic comfort measures Outcome: Progressing   Problem: Coping: Goal: Level of anxiety will decrease Outcome: Progressing

## 2023-11-04 NOTE — Plan of Care (Signed)
   Problem: Clinical Measurements: Goal: Will remain free from infection Outcome: Progressing   Problem: Clinical Measurements: Goal: Diagnostic test results will improve Outcome: Progressing   Problem: Clinical Measurements: Goal: Respiratory complications will improve Outcome: Progressing

## 2023-11-05 DIAGNOSIS — J441 Chronic obstructive pulmonary disease with (acute) exacerbation: Secondary | ICD-10-CM | POA: Diagnosis not present

## 2023-11-05 LAB — CBC
HCT: 34.7 % — ABNORMAL LOW (ref 36.0–46.0)
Hemoglobin: 11 g/dL — ABNORMAL LOW (ref 12.0–15.0)
MCH: 29 pg (ref 26.0–34.0)
MCHC: 31.7 g/dL (ref 30.0–36.0)
MCV: 91.6 fL (ref 80.0–100.0)
Platelets: 232 10*3/uL (ref 150–400)
RBC: 3.79 MIL/uL — ABNORMAL LOW (ref 3.87–5.11)
RDW: 13.3 % (ref 11.5–15.5)
WBC: 10.9 10*3/uL — ABNORMAL HIGH (ref 4.0–10.5)
nRBC: 0 % (ref 0.0–0.2)

## 2023-11-05 LAB — BASIC METABOLIC PANEL
Anion gap: 9 (ref 5–15)
BUN: 21 mg/dL (ref 8–23)
CO2: 30 mmol/L (ref 22–32)
Calcium: 8.7 mg/dL — ABNORMAL LOW (ref 8.9–10.3)
Chloride: 100 mmol/L (ref 98–111)
Creatinine, Ser: 0.33 mg/dL — ABNORMAL LOW (ref 0.44–1.00)
GFR, Estimated: >60 mL/min (ref >60)
Glucose, Bld: 117 mg/dL — ABNORMAL HIGH (ref 70–99)
Potassium: 3.7 mmol/L (ref 3.5–5.1)
Sodium: 139 mmol/L (ref 135–145)

## 2023-11-05 LAB — MAGNESIUM: Magnesium: 2.1 mg/dL (ref 1.7–2.4)

## 2023-11-05 MED ORDER — METHYLPREDNISOLONE SODIUM SUCC 40 MG IJ SOLR
40.0000 mg | INTRAMUSCULAR | Status: DC
Start: 2023-11-06 — End: 2023-11-06
  Administered 2023-11-06: 40 mg via INTRAVENOUS
  Filled 2023-11-05: qty 1

## 2023-11-05 MED ORDER — IPRATROPIUM-ALBUTEROL 0.5-2.5 (3) MG/3ML IN SOLN
3.0000 mL | Freq: Three times a day (TID) | RESPIRATORY_TRACT | Status: DC
  Administered 2023-11-05 – 2023-11-07 (×7): 3 mL via RESPIRATORY_TRACT
  Filled 2023-11-05 (×7): qty 3

## 2023-11-05 NOTE — Progress Notes (Signed)
 PROGRESS NOTE    Tracy Park  ZOX:[id number] DOB: Jan 28, 1956 DOA: 11/01/2023 PCP: Jacquelin Hawking, PA-C    Brief Narrative:   68 year old female, with past medical history significant for stage III lung cancer status post resection of the left upper lobe, COPD, chronic respiratory failure on 2 L of supplemental oxygen, paroxysmal atrial fibrillation on Eliquis, hypothyroidism, anxiety, and depression and ongoing tobacco use disorder. Patient was admitted with COPD exacerbation and acute on chronic hypoxic respiratory failure.  Upon admission started on steroids, bronchodilators and completed a course of azithromycin.  Assessment & Plan:  Principal Problem:   COPD with acute exacerbation (HCC) Active Problems:   Chronic hypoxic respiratory failure (HCC)   Paroxysmal atrial fibrillation (HCC)   Hypothyroidism   History of lung cancer   Nicotine abuse   Anxiety and depression     COPD exacerbation Chronic hypoxic respiratory failure on 2 L nasal cannula at home  Negative for COVID, flu, RSV.  Diffuse abnormal breath sounds -Continue steroids, bronchodilators, I-S/flutter valve. - Azithromycin 500 mg 3 days completed   Hypertension Currently on losartan.  IV as needed   Paroxysmal atrial fibrillation -Continue Eliquis.   -Heart rate is currently controlled.   Stage III lung cancer status post left upper lobe resection -Left upper lobe resection greater than 5 years ago -Currently in remission   Hyperlipidemia Daily Crestor   Anxiety and depression Zoloft   Hypothyroidism Synthroid   Ongoing tobacco use -Reports smoking about 5 cigarettes/week, attempting to cut down on her own -Nicotine patch -Counseled to quit tobacco use.   DVT prophylaxis: Eliquis Code Status: DO NOT RESUSCITATE. Family Communication:  Disposition Plan: Likely will be in the hospital for at least next 2-3 days   Subjective: Still having some exertional dyspnea and  coughing  Examination:  General exam: Appears calm and comfortable  Respiratory system: Bilateral diffuse inspiratory and expiratory wheezing, improved Cardiovascular system: S1 & S2 heard, RRR. No JVD, murmurs, rubs, gallops or clicks. No pedal edema. Gastrointestinal system: Abdomen is nondistended, soft and nontender. No organomegaly or masses felt. Normal bowel sounds heard. Central nervous system: Alert and oriented. No focal neurological deficits. Extremities: Symmetric 5 x 5 power. Skin: No rashes, lesions or ulcers Psychiatry: Judgement and insight appear normal. Mood & affect appropriate.                Diet Orders (From admission, onward)     Start     Ordered   11/01/23 1624  Diet Heart Room service appropriate? Yes; Fluid consistency: Thin  Diet effective now       Question Answer Comment  Room service appropriate? Yes   Fluid consistency: Thin      11/01/23 1624            Objective: Vitals:   11/05/23 0522 11/05/23 0907 11/05/23 1000 11/05/23 1005  BP: 113/75     Pulse: 62     Resp: 15     Temp: 97.8 F (36.6 C)     TempSrc: Oral     SpO2: 100% 97% (S) (!) 87% (S) 91%  Weight:      Height:        Intake/Output Summary (Last 24 hours) at 11/05/2023 1210 Last data filed at 11/05/2023 0957 Gross per 24 hour  Intake 1570 ml  Output 0 ml  Net 1570 ml   Filed Weights   11/01/23 1123 11/01/23 1715  Weight: 45.4 kg 43.1 kg    Scheduled Meds:  apixaban  5 mg Oral BID   budesonide (PULMICORT) nebulizer solution  0.25 mg Nebulization BID   ipratropium-albuterol  3 mL Nebulization TID   levothyroxine  125 mcg Oral QAC breakfast   losartan  12.5 mg Oral Daily   [START ON 11/06/2023] methylPREDNISolone (SOLU-MEDROL) injection  40 mg Intravenous Q24H   nicotine  14 mg Transdermal Daily   rosuvastatin  20 mg Oral Daily   sertraline  25 mg Oral q AM   Continuous Infusions:  Nutritional status     Body mass index is 16.83 kg/m.  Data  Reviewed:   CBC: Recent Labs  Lab 11/01/23 1223 11/02/23 0505 11/04/23 0453 11/05/23 0454  WBC 11.6* 11.9* 11.2* 10.9*  NEUTROABS 10.3*  --   --   --   HGB 12.0 10.8* 10.7* 11.0*  HCT 39.6 35.4* 34.5* 34.7*  MCV 94.1 93.7 94.8 91.6  PLT 213 232 219 232   Basic Metabolic Panel: Recent Labs  Lab 11/01/23 1223 11/02/23 0505 11/04/23 0453 11/05/23 0454  NA 141 139 142 139  K 3.6 3.8 3.7 3.7  CL 98 98 103 100  CO2 31 33* 30 30  GLUCOSE 149* 120* 84 117*  BUN 9 19 28* 21  CREATININE 0.48 0.51 0.59 0.33*  CALCIUM 8.8* 8.9 8.6* 8.7*  MG  --   --  1.9 2.1  PHOS  --   --  2.6  --    GFR: Estimated Creatinine Clearance: 47.7 mL/min (A) (by C-G formula based on SCr of 0.33 mg/dL (L)). Liver Function Tests: No results for input(s): "AST", "ALT", "ALKPHOS", "BILITOT", "PROT", "ALBUMIN" in the last 168 hours. No results for input(s): "LIPASE", "AMYLASE" in the last 168 hours. No results for input(s): "AMMONIA" in the last 168 hours. Coagulation Profile: No results for input(s): "INR", "PROTIME" in the last 168 hours. Cardiac Enzymes: No results for input(s): "CKTOTAL", "CKMB", "CKMBINDEX", "TROPONINI" in the last 168 hours. BNP (last 3 results) No results for input(s): "PROBNP" in the last 8760 hours. HbA1C: No results for input(s): "HGBA1C" in the last 72 hours. CBG: No results for input(s): "GLUCAP" in the last 168 hours. Lipid Profile: No results for input(s): "CHOL", "HDL", "LDLCALC", "TRIG", "CHOLHDL", "LDLDIRECT" in the last 72 hours. Thyroid Function Tests: No results for input(s): "TSH", "T4TOTAL", "FREET4", "T3FREE", "THYROIDAB" in the last 72 hours. Anemia Panel: No results for input(s): "VITAMINB12", "FOLATE", "FERRITIN", "TIBC", "IRON", "RETICCTPCT" in the last 72 hours. Sepsis Labs: No results for input(s): "PROCALCITON", "LATICACIDVEN" in the last 168 hours.  Recent Results (from the past 240 hours)  Resp panel by RT-PCR (RSV, Flu A&B, Covid) Anterior Nasal  Swab     Status: None   Collection Time: 11/01/23 12:24 PM   Specimen: Anterior Nasal Swab  Result Value Ref Range Status   SARS Coronavirus 2 by RT PCR NEGATIVE NEGATIVE Final    Comment: (NOTE) SARS-CoV-2 target nucleic acids are NOT DETECTED.  The SARS-CoV-2 RNA is generally detectable in upper respiratory specimens during the acute phase of infection. The lowest concentration of SARS-CoV-2 viral copies this assay can detect is 138 copies/mL. A negative result does not preclude SARS-Cov-2 infection and should not be used as the sole basis for treatment or other patient management decisions. A negative result may occur with  improper specimen collection/handling, submission of specimen other than nasopharyngeal swab, presence of viral mutation(s) within the areas targeted by this assay, and inadequate number of viral copies(<138 copies/mL). A negative result must be combined with clinical observations, patient history, and  epidemiological information. The expected result is Negative.  Fact Sheet for Patients:  BloggerCourse.com  Fact Sheet for Healthcare Providers:  SeriousBroker.it  This test is no t yet approved or cleared by the [place] FDA and  has been authorized for detection and/or diagnosis of SARS-CoV-2 by FDA under an Emergency Use Authorization (EUA). This EUA will remain  in effect (meaning this test can be used) for the duration of the COVID-19 declaration under Section 564(b)(1) of the Act, 21 U.S.C.section 360bbb-3(b)(1), unless the authorization is terminated  or revoked sooner.       Influenza A by PCR NEGATIVE NEGATIVE Final   Influenza B by PCR NEGATIVE NEGATIVE Final    Comment: (NOTE) The Xpert Xpress SARS-CoV-2/FLU/RSV plus assay is intended as an aid in the diagnosis of influenza from Nasopharyngeal swab specimens and should not be used as a sole basis for treatment. Nasal washings and aspirates  are unacceptable for Xpert Xpress SARS-CoV-2/FLU/RSV testing.  Fact Sheet for Patients: BloggerCourse.com  Fact Sheet for Healthcare Providers: SeriousBroker.it  This test is not yet approved or cleared by the [place] FDA and has been authorized for detection and/or diagnosis of SARS-CoV-2 by FDA under an Emergency Use Authorization (EUA). This EUA will remain in effect (meaning this test can be used) for the duration of the COVID-19 declaration under Section 564(b)(1) of the Act, 21 U.S.C. section 360bbb-3(b)(1), unless the authorization is terminated or revoked.     Resp Syncytial Virus by PCR NEGATIVE NEGATIVE Final    Comment: (NOTE) Fact Sheet for Patients: BloggerCourse.com  Fact Sheet for Healthcare Providers: SeriousBroker.it  This test is not yet approved or cleared by the [place] FDA and has been authorized for detection and/or diagnosis of SARS-CoV-2 by FDA under an Emergency Use Authorization (EUA). This EUA will remain in effect (meaning this test can be used) for the duration of the COVID-19 declaration under Section 564(b)(1) of the Act, 21 U.S.C. section 360bbb-3(b)(1), unless the authorization is terminated or revoked.  Performed at [facility], [address], [place], [place] [address]   Respiratory (~20 pathogens) panel by PCR     Status: None   Collection Time: 11/01/23 12:24 PM   Specimen: Nasopharyngeal Swab; Respiratory  Result Value Ref Range Status   Adenovirus NOT DETECTED NOT DETECTED Final   Coronavirus 229E NOT DETECTED NOT DETECTED Final    Comment: (NOTE) The Coronavirus on the Respiratory Panel, DOES NOT test for the novel  Coronavirus (2019 nCoV)    Coronavirus HKU1 NOT DETECTED NOT DETECTED Final   Coronavirus NL63 NOT DETECTED NOT DETECTED Final   Coronavirus OC43 NOT DETECTED NOT DETECTED Final    Metapneumovirus NOT DETECTED NOT DETECTED Final   Rhinovirus / Enterovirus NOT DETECTED NOT DETECTED Final   Influenza A NOT DETECTED NOT DETECTED Final   Influenza B NOT DETECTED NOT DETECTED Final   Parainfluenza Virus 1 NOT DETECTED NOT DETECTED Final   Parainfluenza Virus 2 NOT DETECTED NOT DETECTED Final   Parainfluenza Virus 3 NOT DETECTED NOT DETECTED Final   Parainfluenza Virus 4 NOT DETECTED NOT DETECTED Final   Respiratory Syncytial Virus NOT DETECTED NOT DETECTED Final   Bordetella pertussis NOT DETECTED NOT DETECTED Final   Bordetella Parapertussis NOT DETECTED NOT DETECTED Final   Chlamydophila pneumoniae NOT DETECTED NOT DETECTED Final   Mycoplasma pneumoniae NOT DETECTED NOT DETECTED Final    Comment: Performed at [facility], [address] [place], [place] [address]  Radiology Studies: No results found.         LOS: 2 days   Time spent= 35 mins    Miguel Rota, MD [facility]  If 7PM-7AM, please contact night-coverage  11/05/2023, 12:10 PM

## 2023-11-05 NOTE — Progress Notes (Signed)
 Check Pulse Ox with Ambulation NO Oxygen Dropped to 87%, Deep Breaths achieved back to 91%

## 2023-11-06 DIAGNOSIS — J441 Chronic obstructive pulmonary disease with (acute) exacerbation: Secondary | ICD-10-CM | POA: Diagnosis not present

## 2023-11-06 LAB — BASIC METABOLIC PANEL
Anion gap: 8 (ref 5–15)
BUN: 24 mg/dL — ABNORMAL HIGH (ref 8–23)
CO2: 28 mmol/L (ref 22–32)
Calcium: 8 mg/dL — ABNORMAL LOW (ref 8.9–10.3)
Chloride: 99 mmol/L (ref 98–111)
Creatinine, Ser: 0.61 mg/dL (ref 0.44–1.00)
GFR, Estimated: >60 mL/min (ref >60)
Glucose, Bld: 81 mg/dL (ref 70–99)
Potassium: 3.7 mmol/L (ref 3.5–5.1)
Sodium: 135 mmol/L (ref 135–145)

## 2023-11-06 LAB — CBC
HCT: 36.4 % (ref 36.0–46.0)
Hemoglobin: 11.2 g/dL — ABNORMAL LOW (ref 12.0–15.0)
MCH: 29 pg (ref 26.0–34.0)
MCHC: 30.8 g/dL (ref 30.0–36.0)
MCV: 94.3 fL (ref 80.0–100.0)
Platelets: 227 10*3/uL (ref 150–400)
RBC: 3.86 MIL/uL — ABNORMAL LOW (ref 3.87–5.11)
RDW: 13.6 % (ref 11.5–15.5)
WBC: 12 10*3/uL — ABNORMAL HIGH (ref 4.0–10.5)
nRBC: 0 % (ref 0.0–0.2)

## 2023-11-06 LAB — MAGNESIUM: Magnesium: 1.9 mg/dL (ref 1.7–2.4)

## 2023-11-06 MED ORDER — SODIUM CHLORIDE 0.9 % IV BOLUS
500.0000 mL | Freq: Once | INTRAVENOUS | Status: AC
  Administered 2023-11-06: 500 mL via INTRAVENOUS

## 2023-11-06 MED ORDER — PREDNISONE 20 MG PO TABS
40.0000 mg | ORAL_TABLET | Freq: Every day | ORAL | Status: DC
  Administered 2023-11-07: 40 mg via ORAL
  Filled 2023-11-06: qty 2

## 2023-11-06 NOTE — Plan of Care (Signed)

## 2023-11-06 NOTE — Progress Notes (Signed)
 PROGRESS NOTE    Tracy Park  ZOX:[id number] DOB: 06-May-1956 DOA: 11/01/2023 PCP: Jacquelin Hawking, PA-C    Brief Narrative:   68 year old female, with past medical history significant for stage III lung cancer status post resection of the left upper lobe, COPD, chronic respiratory failure on 2 L of supplemental oxygen, paroxysmal atrial fibrillation on Eliquis, hypothyroidism, anxiety, and depression and ongoing tobacco use disorder. Patient was admitted with COPD exacerbation and acute on chronic hypoxic respiratory failure.  Upon admission started on steroids, bronchodilators and completed a course of azithromycin.  Assessment & Plan:  Principal Problem:   COPD with acute exacerbation (HCC) Active Problems:   Chronic hypoxic respiratory failure (HCC)   Paroxysmal atrial fibrillation (HCC)   Hypothyroidism   History of lung cancer   Nicotine abuse   Anxiety and depression     COPD exacerbation Chronic hypoxic respiratory failure on 2 L nasal cannula at home  Negative for COVID, flu, RSV.  Diffuse abnormal breath sounds -Continue steroids, bronchodilators, I-S/flutter valve. - Azithromycin 500 mg 3 days completed Transition to p.o. steroids Increasing BUN Cr ratio, will order one time 500cc [place] bolus Leukocytosis due to steroids.    Hypertension Currently on losartan.  IV as needed   Paroxysmal atrial fibrillation -Continue Eliquis.   -Heart rate is currently controlled.   Stage III lung cancer status post left upper lobe resection -Left upper lobe resection greater than 5 years ago -Currently in remission   Hyperlipidemia Daily Crestor   Anxiety and depression Zoloft   Hypothyroidism Synthroid   Ongoing tobacco use -Reports smoking about 5 cigarettes/week, attempting to cut down on her own -Nicotine patch -Counseled to quit tobacco use.   DVT prophylaxis: Eliquis Code Status: DO NOT RESUSCITATE. Family Communication:  Disposition Plan: Likely  discharge tomorrow   Subjective: Feeling better still having some exertional dyspnea.  Examination:  General exam: Appears calm and comfortable  Respiratory system: Bilateral diffuse inspiratory and expiratory wheezing, markedly improved Cardiovascular system: S1 & S2 heard, RRR. No JVD, murmurs, rubs, gallops or clicks. No pedal edema. Gastrointestinal system: Abdomen is nondistended, soft and nontender. No organomegaly or masses felt. Normal bowel sounds heard. Central nervous system: Alert and oriented. No focal neurological deficits. Extremities: Symmetric 5 x 5 power. Skin: No rashes, lesions or ulcers Psychiatry: Judgement and insight appear normal. Mood & affect appropriate.                Diet Orders (From admission, onward)     Start     Ordered   11/05/23 1257  Diet regular Room service appropriate? Yes; Fluid consistency: Thin  Diet effective now       Question Answer Comment  Room service appropriate? Yes   Fluid consistency: Thin      11/05/23 1256            Objective: Vitals:   11/06/23 0548 11/06/23 0857 11/06/23 0901 11/06/23 1013  BP: 111/76     Pulse: 66     Resp: 16     Temp: (!) 97.5 F (36.4 C)     TempSrc: Oral     SpO2: 100% 96% 96% 92%  Weight:      Height:        Intake/Output Summary (Last 24 hours) at 11/06/2023 1138 Last data filed at 11/06/2023 1000 Gross per 24 hour  Intake 1490.15 ml  Output --  Net 1490.15 ml   Filed Weights   11/01/23 1123 11/01/23 1715  Weight: 45.4 kg  43.1 kg    Scheduled Meds:  apixaban  5 mg Oral BID   budesonide (PULMICORT) nebulizer solution  0.25 mg Nebulization BID   ipratropium-albuterol  3 mL Nebulization TID   levothyroxine  125 mcg Oral QAC breakfast   losartan  12.5 mg Oral Daily   nicotine  14 mg Transdermal Daily   [START ON 11/07/2023] predniSONE  40 mg Oral Q breakfast   rosuvastatin  20 mg Oral Daily   sertraline  25 mg Oral q AM   Continuous Infusions:  Nutritional  status     Body mass index is 16.83 kg/m.  Data Reviewed:   CBC: Recent Labs  Lab 11/01/23 1223 11/02/23 0505 11/04/23 0453 11/05/23 0454 11/06/23 0515  WBC 11.6* 11.9* 11.2* 10.9* 12.0*  NEUTROABS 10.3*  --   --   --   --   HGB 12.0 10.8* 10.7* 11.0* 11.2*  HCT 39.6 35.4* 34.5* 34.7* 36.4  MCV 94.1 93.7 94.8 91.6 94.3  PLT 213 232 219 232 227   Basic Metabolic Panel: Recent Labs  Lab 11/01/23 1223 11/02/23 0505 11/04/23 0453 11/05/23 0454 11/06/23 0515  NA 141 139 142 139 135  K 3.6 3.8 3.7 3.7 3.7  CL 98 98 103 100 99  CO2 31 33* 30 30 28   GLUCOSE 149* 120* 84 117* 81  BUN 9 19 28* 21 24*  CREATININE 0.48 0.51 0.59 0.33* 0.61  CALCIUM 8.8* 8.9 8.6* 8.7* 8.0*  MG  --   --  1.9 2.1 1.9  PHOS  --   --  2.6  --   --    GFR: Estimated Creatinine Clearance: 47.7 mL/min (by C-G formula based on SCr of 0.61 mg/dL). Liver Function Tests: No results for input(s): "AST", "ALT", "ALKPHOS", "BILITOT", "PROT", "ALBUMIN" in the last 168 hours. No results for input(s): "LIPASE", "AMYLASE" in the last 168 hours. No results for input(s): "AMMONIA" in the last 168 hours. Coagulation Profile: No results for input(s): "INR", "PROTIME" in the last 168 hours. Cardiac Enzymes: No results for input(s): "CKTOTAL", "CKMB", "CKMBINDEX", "TROPONINI" in the last 168 hours. BNP (last 3 results) No results for input(s): "PROBNP" in the last 8760 hours. HbA1C: No results for input(s): "HGBA1C" in the last 72 hours. CBG: No results for input(s): "GLUCAP" in the last 168 hours. Lipid Profile: No results for input(s): "CHOL", "HDL", "LDLCALC", "TRIG", "CHOLHDL", "LDLDIRECT" in the last 72 hours. Thyroid Function Tests: No results for input(s): "TSH", "T4TOTAL", "FREET4", "T3FREE", "THYROIDAB" in the last 72 hours. Anemia Panel: No results for input(s): "VITAMINB12", "FOLATE", "FERRITIN", "TIBC", "IRON", "RETICCTPCT" in the last 72 hours. Sepsis Labs: No results for input(s):  "PROCALCITON", "LATICACIDVEN" in the last 168 hours.  Recent Results (from the past 240 hours)  Resp panel by RT-PCR (RSV, Flu A&B, Covid) Anterior Nasal Swab     Status: None   Collection Time: 11/01/23 12:24 PM   Specimen: Anterior Nasal Swab  Result Value Ref Range Status   SARS Coronavirus 2 by RT PCR NEGATIVE NEGATIVE Final    Comment: (NOTE) SARS-CoV-2 target nucleic acids are NOT DETECTED.  The SARS-CoV-2 RNA is generally detectable in upper respiratory specimens during the acute phase of infection. The lowest concentration of SARS-CoV-2 viral copies this assay can detect is 138 copies/mL. A negative result does not preclude SARS-Cov-2 infection and should not be used as the sole basis for treatment or other patient management decisions. A negative result may occur with  improper specimen collection/handling, submission of specimen other than nasopharyngeal  swab, presence of viral mutation(s) within the areas targeted by this assay, and inadequate number of viral copies(<138 copies/mL). A negative result must be combined with clinical observations, patient history, and epidemiological information. The expected result is Negative.  Fact Sheet for Patients:  BloggerCourse.com  Fact Sheet for Healthcare Providers:  SeriousBroker.it  This test is no t yet approved or cleared by the [place] FDA and  has been authorized for detection and/or diagnosis of SARS-CoV-2 by FDA under an Emergency Use Authorization (EUA). This EUA will remain  in effect (meaning this test can be used) for the duration of the COVID-19 declaration under Section 564(b)(1) of the Act, 21 U.S.C.section 360bbb-3(b)(1), unless the authorization is terminated  or revoked sooner.       Influenza A by PCR NEGATIVE NEGATIVE Final   Influenza B by PCR NEGATIVE NEGATIVE Final    Comment: (NOTE) The Xpert Xpress SARS-CoV-2/FLU/RSV plus assay is intended as  an aid in the diagnosis of influenza from Nasopharyngeal swab specimens and should not be used as a sole basis for treatment. Nasal washings and aspirates are unacceptable for Xpert Xpress SARS-CoV-2/FLU/RSV testing.  Fact Sheet for Patients: BloggerCourse.com  Fact Sheet for Healthcare Providers: SeriousBroker.it  This test is not yet approved or cleared by the [place] FDA and has been authorized for detection and/or diagnosis of SARS-CoV-2 by FDA under an Emergency Use Authorization (EUA). This EUA will remain in effect (meaning this test can be used) for the duration of the COVID-19 declaration under Section 564(b)(1) of the Act, 21 U.S.C. section 360bbb-3(b)(1), unless the authorization is terminated or revoked.     Resp Syncytial Virus by PCR NEGATIVE NEGATIVE Final    Comment: (NOTE) Fact Sheet for Patients: BloggerCourse.com  Fact Sheet for Healthcare Providers: SeriousBroker.it  This test is not yet approved or cleared by the [place] FDA and has been authorized for detection and/or diagnosis of SARS-CoV-2 by FDA under an Emergency Use Authorization (EUA). This EUA will remain in effect (meaning this test can be used) for the duration of the COVID-19 declaration under Section 564(b)(1) of the Act, 21 U.S.C. section 360bbb-3(b)(1), unless the authorization is terminated or revoked.  Performed at [facility], [address], [place], [place] [address]   Respiratory (~20 pathogens) panel by PCR     Status: None   Collection Time: 11/01/23 12:24 PM   Specimen: Nasopharyngeal Swab; Respiratory  Result Value Ref Range Status   Adenovirus NOT DETECTED NOT DETECTED Final   Coronavirus 229E NOT DETECTED NOT DETECTED Final    Comment: (NOTE) The Coronavirus on the Respiratory Panel, DOES NOT test for the novel  Coronavirus (2019 nCoV)     Coronavirus HKU1 NOT DETECTED NOT DETECTED Final   Coronavirus NL63 NOT DETECTED NOT DETECTED Final   Coronavirus OC43 NOT DETECTED NOT DETECTED Final   Metapneumovirus NOT DETECTED NOT DETECTED Final   Rhinovirus / Enterovirus NOT DETECTED NOT DETECTED Final   Influenza A NOT DETECTED NOT DETECTED Final   Influenza B NOT DETECTED NOT DETECTED Final   Parainfluenza Virus 1 NOT DETECTED NOT DETECTED Final   Parainfluenza Virus 2 NOT DETECTED NOT DETECTED Final   Parainfluenza Virus 3 NOT DETECTED NOT DETECTED Final   Parainfluenza Virus 4 NOT DETECTED NOT DETECTED Final   Respiratory Syncytial Virus NOT DETECTED NOT DETECTED Final   Bordetella pertussis NOT DETECTED NOT DETECTED Final   Bordetella Parapertussis NOT DETECTED NOT DETECTED Final   Chlamydophila pneumoniae NOT DETECTED NOT DETECTED Final  Mycoplasma pneumoniae NOT DETECTED NOT DETECTED Final    Comment: Performed at [facility], [address], [place], [place] [address]         Radiology Studies: No results found.         LOS: 3 days   Time spent= 35 mins    Miguel Rota, MD [facility]  If 7PM-7AM, please contact night-coverage  11/06/2023, 11:38 AM

## 2023-11-06 NOTE — Progress Notes (Signed)
 Mobility Specialist - Progress Note   11/06/23 1013  Oxygen Therapy  SpO2 92 %  O2 Device Nasal Cannula  O2 Flow Rate (L/min) 2 L/min  Patient Activity (if Appropriate) Ambulating  Mobility  Activity Ambulated independently in hallway  Level of Assistance Independent  Assistive Device None  Distance Ambulated (ft) 480 ft  Activity Response Tolerated well  Mobility Referral Yes  Mobility visit 1 Mobility  Mobility Specialist Start Time (ACUTE ONLY) 0955  Mobility Specialist Stop Time (ACUTE ONLY) 1013  Mobility Specialist Time Calculation (min) (ACUTE ONLY) 18 min   Pt received in bed and agreeable to mobility. Pt c/o wheezing throughout session. No other complaints during session. Pt to bed after session with all needs met.    Pre-mobility: 93% SpO2 (2L [place]) During mobility: 92% SpO2 (2L [place]) Post-mobility: 93% SPO2 (2L [place])  Chief Technology Officer

## 2023-11-06 NOTE — Plan of Care (Signed)

## 2023-11-06 NOTE — Progress Notes (Signed)
 SATURATION QUALIFICATIONS: (This note is used to comply with regulatory documentation for home oxygen)  Patient Saturations on Room Air at Rest = 92%  Patient Saturations on Room Air while Ambulating = 88%  Patient Saturations on 2 Liters of oxygen while Ambulating = 94%  Please briefly explain why patient needs home oxygen: Pt desats with ambulation.

## 2023-11-07 ENCOUNTER — Other Ambulatory Visit (HOSPITAL_COMMUNITY): Payer: Self-pay

## 2023-11-07 DIAGNOSIS — J441 Chronic obstructive pulmonary disease with (acute) exacerbation: Secondary | ICD-10-CM | POA: Diagnosis not present

## 2023-11-07 LAB — CBC
HCT: 34.4 % — ABNORMAL LOW (ref 36.0–46.0)
Hemoglobin: 10.7 g/dL — ABNORMAL LOW (ref 12.0–15.0)
MCH: 28.6 pg (ref 26.0–34.0)
MCHC: 31.1 g/dL (ref 30.0–36.0)
MCV: 92 fL (ref 80.0–100.0)
Platelets: 218 10*3/uL (ref 150–400)
RBC: 3.74 MIL/uL — ABNORMAL LOW (ref 3.87–5.11)
RDW: 13.6 % (ref 11.5–15.5)
WBC: 11.6 10*3/uL — ABNORMAL HIGH (ref 4.0–10.5)
nRBC: 0 % (ref 0.0–0.2)

## 2023-11-07 LAB — BASIC METABOLIC PANEL
Anion gap: 6 (ref 5–15)
BUN: 29 mg/dL — ABNORMAL HIGH (ref 8–23)
CO2: 31 mmol/L (ref 22–32)
Calcium: 8.4 mg/dL — ABNORMAL LOW (ref 8.9–10.3)
Chloride: 101 mmol/L (ref 98–111)
Creatinine, Ser: 0.68 mg/dL (ref 0.44–1.00)
GFR, Estimated: >60 mL/min (ref >60)
Glucose, Bld: 91 mg/dL (ref 70–99)
Potassium: 3.8 mmol/L (ref 3.5–5.1)
Sodium: 138 mmol/L (ref 135–145)

## 2023-11-07 LAB — MAGNESIUM: Magnesium: 2 mg/dL (ref 1.7–2.4)

## 2023-11-07 MED ORDER — ALBUTEROL SULFATE HFA 108 (90 BASE) MCG/ACT IN AERS
2.0000 | INHALATION_SPRAY | Freq: Four times a day (QID) | RESPIRATORY_TRACT | 0 refills | Status: DC | PRN
  Filled 2023-11-07: qty 6.7, 20d supply, fill #0

## 2023-11-07 MED ORDER — PREDNISONE 20 MG PO TABS
ORAL_TABLET | ORAL | 0 refills | Status: AC
  Filled 2023-11-07: qty 15, 12d supply, fill #0

## 2023-11-07 NOTE — TOC Transition Note (Signed)
 Transition of Care [facility]) - Discharge Note   Patient Details  Name: Tracy Park MRN: [id number] Date of Birth: February 15, 1956  Transition of Care [facility] CM/SW Contact:  Howell Rucks, RN Phone Number: 11/07/2023, 8:20 AM   Clinical Narrative:   DC order to home, pt requesting assistance with transportation, reports her dtr is ill and unable to  provide transportation. Pt has portable 02 in room, confirmed she is able to transport in a taxi with portable 02 and get in to her home on her own, reports he dtr lives close by.. NCM provided [employer]. No further TOC needs identified.     Final next level of care: Home/Self Care Barriers to Discharge: Barriers Resolved   Patient Goals and CMS Choice Patient states their goals for this hospitalization and ongoing recovery are:: return home CMS Medicare.gov Compare Post Acute Care list provided to:: Patient        Discharge Placement                       Discharge Plan and Services Additional resources added to the After Visit Summary for     Discharge Planning Services: CM Consult            DME Arranged: [employer]xygen DME Agency: [employer] Date DME Agency Contacted: 11/02/23 Time DME Agency Contacted: 1110 Representative spoke with at DME Agency: Vaughan Basta            Social Drivers of Health (SDOH) Interventions SDOH Screenings   Food Insecurity: No Food Insecurity (11/02/2023)  Recent Concern: Food Insecurity - Food Insecurity Present (10/08/2023)   Received from [facility]  Housing: Low Risk  (11/02/2023)  Recent Concern: Housing - High Risk (09/23/2023)  Transportation Needs: No Transportation Needs (11/02/2023)  Recent Concern: Transportation Needs - Unmet Transportation Needs (10/08/2023)   Received from [facility]  Utilities: Not At Risk (11/02/2023)  Financial Resource Strain: High Risk (10/08/2023)   Received from [facility]  Physical Activity: Unknown (04/07/2023)   Received from [facility]  Social Connections: Moderately Isolated (11/01/2023)  Stress: Stress Concern Present (04/07/2023)   Received from [facility]  Tobacco Use: High Risk (11/01/2023)     Readmission Risk Interventions    11/07/2023    8:20 AM 09/28/2023    4:41 PM 09/24/2023    2:07 PM  Readmission Risk Prevention Plan  Transportation Screening Complete Complete Complete  Medication Review [employer]) Complete Complete Complete  PCP or Specialist appointment within 3-5 days of discharge Complete Complete Complete  HRI or Home Care Consult Complete Complete Complete  SW Recovery Care/Counseling Consult Complete Complete Complete  Palliative Care Screening Not Applicable Not Applicable Not Applicable  Skilled Nursing Facility Not Applicable Not Applicable Not Applicable

## 2023-11-07 NOTE — Discharge Summary (Signed)
 Physician Discharge Summary  ADIANNA DARWIN HQI:[id number] DOB: 1956/05/09 DOA: 11/01/2023  PCP: Jacquelin Hawking, PA-C  Admit date: 11/01/2023 Discharge date: 11/07/2023  Admitted From: Home Disposition: Home  Recommendations for Outpatient Follow-up:  Follow up with PCP in 1-2 weeks Please obtain BMP/CBC in one week your next doctors visit.  Prolonged prednisone taper Outpatient follow-up with pulmonary 2 L home oxygen  Discharge Condition: Stable CODE STATUS: DNR Diet recommendation: Regular  Brief/Interim Summary: Brief Narrative:   68 year old female, with past medical history significant for stage III lung cancer status post resection of the left upper lobe, COPD, chronic respiratory failure on 2 L of supplemental oxygen, paroxysmal atrial fibrillation on Eliquis, hypothyroidism, anxiety, and depression and ongoing tobacco use disorder. Patient was admitted with COPD exacerbation and acute on chronic hypoxic respiratory failure.  Upon admission started on steroids, bronchodilators and completed a course of azithromycin.  Slowly patient started doing well and steroids were transitioned to p.o. Over the course of 6 days in the hospital she did significantly well on the day of discharge.  With ambulation she was getting hypoxic therefore home oxygen was arranged which she was agreeable to.  Medically stable for discharge today.  Assessment & Plan:  Principal Problem:   COPD with acute exacerbation (HCC) Active Problems:   Chronic hypoxic respiratory failure (HCC)   Paroxysmal atrial fibrillation (HCC)   Hypothyroidism   History of lung cancer   Nicotine abuse   Anxiety and depression     COPD exacerbation Chronic hypoxic respiratory failure on 2 L nasal cannula at home  Negative for COVID, flu, RSV.  Diffuse abnormal breath sounds -Continue steroids, bronchodilators, I-S/flutter valve. - Azithromycin 500 mg 3 days completed -Now on p.o. steroids, prolonged taper #  Leukocytosis due to steroids   Hypertension Currently on losartan.  IV as needed   Paroxysmal atrial fibrillation -Continue Eliquis.   -Heart rate is currently controlled.   Stage III lung cancer status post left upper lobe resection -Left upper lobe resection greater than 5 years ago -Currently in remission   Hyperlipidemia Daily Crestor   Anxiety and depression Zoloft   Hypothyroidism Synthroid   Ongoing tobacco use -Reports smoking about 5 cigarettes/week, attempting to cut down on her own -Nicotine patch -Counseled to quit tobacco use.   DVT prophylaxis: Eliquis Code Status: DO NOT RESUSCITATE. Family Communication:  Disposition Plan: Discharge   Subjective: No complaints. Wants to go home.   Examination:  General exam: Appears calm and comfortable  Respiratory system: Minimal bilateral rhonchi, much improved Cardiovascular system: S1 & S2 heard, RRR. No JVD, murmurs, rubs, gallops or clicks. No pedal edema. Gastrointestinal system: Abdomen is nondistended, soft and nontender. No organomegaly or masses felt. Normal bowel sounds heard. Central nervous system: Alert and oriented. No focal neurological deficits. Extremities: Symmetric 5 x 5 power. Skin: No rashes, lesions or ulcers Psychiatry: Judgement and insight appear normal. Mood & affect appropriate.    Discharge Diagnoses:  Principal Problem:   COPD with acute exacerbation (HCC) Active Problems:   Chronic hypoxic respiratory failure (HCC)   COPD exacerbation (HCC)   Paroxysmal atrial fibrillation (HCC)   Hypothyroidism   History of lung cancer   Nicotine abuse   Anxiety and depression      Discharge Exam: Vitals:   11/07/23 0857 11/07/23 0900  BP:    Pulse:    Resp:    Temp:    SpO2: 97% 97%   Vitals:   11/06/23 2052 11/07/23 0539 11/07/23 0857 11/07/23  0900  BP: 106/70 110/70    Pulse: 83 69    Resp: 16 15    Temp: 97.7 F (36.5 C) 97.7 F (36.5 C)    TempSrc: Oral Oral     SpO2: 99% 98% 97% 97%  Weight:      Height:          Discharge Instructions  Discharge Instructions     meds to beds pharmacy consult ([facility] ONLY)   Complete by: As directed       Allergies as of 11/07/2023       Reactions   Red Dye #40 (allura Red) Hives, Itching, Other (See Comments)   Red food dye   Strawberry Extract Hives, Itching   Tomato Hives, Itching   Aspirin Hives   Tape Rash, Other (See Comments)   Prefers paper tape   Wound Dressing Adhesive Rash        Medication List     STOP taking these medications    amoxicillin-clavulanate 875-125 MG tablet Commonly known as: AUGMENTIN       TAKE these medications    albuterol (2.5 MG/3ML) 0.083% nebulizer solution Commonly known as: PROVENTIL Take 3 mLs (2.5 mg total) by nebulization every 4 (four) hours while awake for 3 days, THEN 3 mLs (2.5 mg total) every 4 (four) hours as needed for wheezing or shortness of breath. Start taking on: October 18, 2023 What changed: See the new instructions.   albuterol 108 (90 Base) MCG/ACT inhaler Commonly known as: VENTOLIN HFA Inhale 2 puffs into the lungs every 4 (four) hours as needed for wheezing or shortness of breath. What changed:  Another medication with the same name was added. Make sure you understand how and when to take each. Another medication with the same name was changed. Make sure you understand how and when to take each.   albuterol 108 (90 Base) MCG/ACT inhaler Commonly known as: VENTOLIN HFA Inhale 2 puffs into the lungs every 6 (six) hours as needed for wheezing or shortness of breath. What changed: You were already taking a medication with the same name, and this prescription was added. Make sure you understand how and when to take each.   Breztri Aerosphere 160-9-4.8 MCG/ACT Aero Generic drug: Budeson-Glycopyrrol-Formoterol Inhale 1 puff into the lungs in the morning and at bedtime.   Eliquis 5 MG Tabs tablet Generic drug:  apixaban Take 1 tablet (5 mg total) by mouth 2 (two) times daily.   ipratropium-albuterol 0.5-2.5 (3) MG/3ML Soln Commonly known as: DUONEB Take 3 mLs by nebulization every 6 (six) hours as needed.   levothyroxine 125 MCG tablet Commonly known as: SYNTHROID Take 125 mcg by mouth daily before breakfast.   losartan 25 MG tablet Commonly known as: COZAAR Take 12.5 mg by mouth daily.   magnesium oxide 400 (240 Mg) MG tablet Commonly known as: MAG-OX Take 400 mg by mouth in the morning.   metoprolol succinate 25 MG 24 hr tablet Commonly known as: TOPROL-XL Take 0.5 tablets (12.5 mg total) by mouth daily.   nitroGLYCERIN 0.4 MG SL tablet Commonly known as: NITROSTAT Place 0.4 mg under the tongue every 5 (five) minutes as needed for chest pain.   oxyCODONE 5 MG immediate release tablet Commonly known as: Roxicodone Take 1 tablet (5 mg total) by mouth every 6 (six) hours as needed for severe pain (pain score 7-10). What changed: how much to take   OXYGEN Inhale 2 L/min into the lungs continuous.   predniSONE 20 MG tablet Commonly  known as: DELTASONE Take 2 tablets (40 mg total) by mouth daily with breakfast for 3 days, THEN 1.5 tablets (30 mg total) daily with breakfast for 3 days, THEN 1 tablet (20 mg total) daily with breakfast for 3 days, THEN 0.5 tablets (10 mg total) daily with breakfast for 3 days. Start taking on: November 08, 2023   rosuvastatin 20 MG tablet Commonly known as: CRESTOR Take 20 mg by mouth daily.   sertraline 25 MG tablet Commonly known as: ZOLOFT Take 25 mg by mouth in the morning.   Systane Complete PF 0.6 % Soln Generic drug: Propylene Glycol (PF) Place 1 drop into both eyes 4 (four) times daily.   Tylenol 8 Hour 650 MG CR tablet Generic drug: acetaminophen Take 650-1,300 mg by mouth 2 (two) times daily as needed for pain.               Durable Medical Equipment  (From admission, onward)           Start     Ordered   11/07/23 0806   For home use only DME oxygen  Once       Comments: Dx: COPD  Question Answer Comment  Length of Need Lifetime   Mode or (Route) Nasal cannula   Liters per Minute 2   Frequency Continuous (stationary and portable oxygen unit needed)   Oxygen delivery system Gas      11/07/23 0805            Follow-up Information     Jacquelin Hawking, PA-C Follow up in 1 week(s).   Specialty: Physician Assistant Contact information: [address] [place] [address] [phone]                Allergies  Allergen Reactions   Red Dye #40 (Allura Red) Hives, Itching and Other (See Comments)    Red food dye   Strawberry Extract Hives and Itching   Tomato Hives and Itching   Aspirin Hives   Tape Rash and Other (See Comments)    Prefers paper tape   Wound Dressing Adhesive Rash    You were cared for by a hospitalist during your hospital stay. If you have any questions about your discharge medications or the care you received while you were in the hospital after you are discharged, you can call the unit and asked to speak with the hospitalist on call if the hospitalist that took care of you is not available. Once you are discharged, your primary care physician will handle any further medical issues. Please note that no refills for any discharge medications will be authorized once you are discharged, as it is imperative that you return to your primary care physician (or establish a relationship with a primary care physician if you do not have one) for your aftercare needs so that they can reassess your need for medications and monitor your lab values.  You were cared for by a hospitalist during your hospital stay. If you have any questions about your discharge medications or the care you received while you were in the hospital after you are discharged, you can call the unit and asked to speak with the hospitalist on call if the hospitalist that took care of you is not  available. Once you are discharged, your primary care physician will handle any further medical issues. Please note that NO REFILLS for any discharge medications will be authorized once you are discharged, as it is imperative that you return  to your primary care physician (or establish a relationship with a primary care physician if you do not have one) for your aftercare needs so that they can reassess your need for medications and monitor your lab values.  Please request your Prim.MD to go over all Hospital Tests and Procedure/Radiological results at the follow up, please get all Hospital records sent to your Prim MD by signing hospital release before you go home.  Get CBC, CMP, 2 view Chest X ray checked  by Primary MD during your next visit or SNF MD in 5-7 days ( we routinely change or add medications that can affect your baseline labs and fluid status, therefore we recommend that you get the mentioned basic workup next visit with your PCP, your PCP may decide not to get them or add new tests based on their clinical decision)  On your next visit with your primary care physician please Get Medicines reviewed and adjusted.  If you experience worsening of your admission symptoms, develop shortness of breath, life threatening emergency, suicidal or homicidal thoughts you must seek medical attention immediately by calling 911 or calling your MD immediately  if symptoms less severe.  You Must read complete instructions/literature along with all the possible adverse reactions/side effects for all the Medicines you take and that have been prescribed to you. Take any new Medicines after you have completely understood and accpet all the possible adverse reactions/side effects.   Do not drive, operate heavy machinery, perform activities at heights, swimming or participation in water activities or provide baby sitting services if your were admitted for syncope or siezures until you have seen by Primary MD or a  Neurologist and advised to do so again.  Do not drive when taking Pain medications.   Procedures/Studies: DG Chest 2 View Result Date: 11/01/2023 CLINICAL DATA:  Chest pain, shortness of breath EXAM: CHEST - 2 VIEW COMPARISON:  10/20/2023 FINDINGS: Postsurgical changes to the chest with extensive scarring within the upper left hemithorax with volume loss and leftward shift of the heart and mediastinal structures. Background of emphysema. No new focal airspace consolidation. No pleural effusion or pneumothorax. Stable heart size. Aortic atherosclerosis. IMPRESSION: Chronic changes without evidence of acute cardiopulmonary disease. Electronically Signed   By: Duanne Guess D.O.   On: 11/01/2023 13:08   DG Chest 2 View Result Date: 10/20/2023 CLINICAL DATA:  Shortness of breath. EXAM: CHEST - 2 VIEW COMPARISON:  October 18, 2023 FINDINGS: The heart size and mediastinal contours are within normal limits. Stable moderate severity scarring within the upper left lung with postoperative changes consistent with the patient's history of prior left upper lobectomy. There is associated volume loss with subsequent right to left shift of superior mediastinal structures. There is no evidence of acute infiltrate, pleural effusion or pneumothorax. Postoperative changes are seen within the lower cervical spine. IMPRESSION: 1. Stable postoperative changes consistent with the patient's history of prior left upper lobectomy. 2. No acute or active cardiopulmonary disease. Electronically Signed   By: Aram Candela M.D.   On: 10/20/2023 18:42   DG Chest Portable 1 View Result Date: 10/18/2023 CLINICAL DATA:  Chest pain EXAM: PORTABLE CHEST 1 VIEW COMPARISON:  None Available. FINDINGS: Left apical scarring again noted, unchanged. Postoperative changes in the left lung. No acute confluent airspace opacities or effusions. Heart mediastinal contours within normal limits. IMPRESSION: Postoperative and chronic changes in the  left lung/apex. No active disease. Electronically Signed   By: Charlett Nose M.D.   On: 10/18/2023 20:18  The results of significant diagnostics from this hospitalization (including imaging, microbiology, ancillary and laboratory) are listed below for reference.     Microbiology: Recent Results (from the past 240 hours)  Resp panel by RT-PCR (RSV, Flu A&B, Covid) Anterior Nasal Swab     Status: None   Collection Time: 11/01/23 12:24 PM   Specimen: Anterior Nasal Swab  Result Value Ref Range Status   SARS Coronavirus 2 by RT PCR NEGATIVE NEGATIVE Final    Comment: (NOTE) SARS-CoV-2 target nucleic acids are NOT DETECTED.  The SARS-CoV-2 RNA is generally detectable in upper respiratory specimens during the acute phase of infection. The lowest concentration of SARS-CoV-2 viral copies this assay can detect is 138 copies/mL. A negative result does not preclude SARS-Cov-2 infection and should not be used as the sole basis for treatment or other patient management decisions. A negative result may occur with  improper specimen collection/handling, submission of specimen other than nasopharyngeal swab, presence of viral mutation(s) within the areas targeted by this assay, and inadequate number of viral copies(<138 copies/mL). A negative result must be combined with clinical observations, patient history, and epidemiological information. The expected result is Negative.  Fact Sheet for Patients:  BloggerCourse.com  Fact Sheet for Healthcare Providers:  SeriousBroker.it  This test is no t yet approved or cleared by the [place] FDA and  has been authorized for detection and/or diagnosis of SARS-CoV-2 by FDA under an Emergency Use Authorization (EUA). This EUA will remain  in effect (meaning this test can be used) for the duration of the COVID-19 declaration under Section 564(b)(1) of the Act, 21 U.S.C.section 360bbb-3(b)(1), unless  the authorization is terminated  or revoked sooner.       Influenza A by PCR NEGATIVE NEGATIVE Final   Influenza B by PCR NEGATIVE NEGATIVE Final    Comment: (NOTE) The Xpert Xpress SARS-CoV-2/FLU/RSV plus assay is intended as an aid in the diagnosis of influenza from Nasopharyngeal swab specimens and should not be used as a sole basis for treatment. Nasal washings and aspirates are unacceptable for Xpert Xpress SARS-CoV-2/FLU/RSV testing.  Fact Sheet for Patients: BloggerCourse.com  Fact Sheet for Healthcare Providers: SeriousBroker.it  This test is not yet approved or cleared by the [place] FDA and has been authorized for detection and/or diagnosis of SARS-CoV-2 by FDA under an Emergency Use Authorization (EUA). This EUA will remain in effect (meaning this test can be used) for the duration of the COVID-19 declaration under Section 564(b)(1) of the Act, 21 U.S.C. section 360bbb-3(b)(1), unless the authorization is terminated or revoked.     Resp Syncytial Virus by PCR NEGATIVE NEGATIVE Final    Comment: (NOTE) Fact Sheet for Patients: BloggerCourse.com  Fact Sheet for Healthcare Providers: SeriousBroker.it  This test is not yet approved or cleared by the [place] FDA and has been authorized for detection and/or diagnosis of SARS-CoV-2 by FDA under an Emergency Use Authorization (EUA). This EUA will remain in effect (meaning this test can be used) for the duration of the COVID-19 declaration under Section 564(b)(1) of the Act, 21 U.S.C. section 360bbb-3(b)(1), unless the authorization is terminated or revoked.  Performed at [facility], [address], [place], [place] [address]   Respiratory (~20 pathogens) panel by PCR     Status: None   Collection Time: 11/01/23 12:24 PM   Specimen: Nasopharyngeal Swab; Respiratory  Result Value Ref  Range Status   Adenovirus NOT DETECTED NOT DETECTED Final   Coronavirus 229E NOT DETECTED NOT DETECTED Final  Comment: (NOTE) The Coronavirus on the Respiratory Panel, DOES NOT test for the novel  Coronavirus (2019 nCoV)    Coronavirus HKU1 NOT DETECTED NOT DETECTED Final   Coronavirus NL63 NOT DETECTED NOT DETECTED Final   Coronavirus OC43 NOT DETECTED NOT DETECTED Final   Metapneumovirus NOT DETECTED NOT DETECTED Final   Rhinovirus / Enterovirus NOT DETECTED NOT DETECTED Final   Influenza A NOT DETECTED NOT DETECTED Final   Influenza B NOT DETECTED NOT DETECTED Final   Parainfluenza Virus 1 NOT DETECTED NOT DETECTED Final   Parainfluenza Virus 2 NOT DETECTED NOT DETECTED Final   Parainfluenza Virus 3 NOT DETECTED NOT DETECTED Final   Parainfluenza Virus 4 NOT DETECTED NOT DETECTED Final   Respiratory Syncytial Virus NOT DETECTED NOT DETECTED Final   Bordetella pertussis NOT DETECTED NOT DETECTED Final   Bordetella Parapertussis NOT DETECTED NOT DETECTED Final   Chlamydophila pneumoniae NOT DETECTED NOT DETECTED Final   Mycoplasma pneumoniae NOT DETECTED NOT DETECTED Final    Comment: Performed at [facility], [address] [place], [place] [address]     Labs: BNP (last 3 results) Recent Labs    04/16/23 2048 08/03/23 1723 11/01/23 1223  BNP 114.4* 68.2 62.4   Basic Metabolic Panel: Recent Labs  Lab 11/02/23 0505 11/04/23 0453 11/05/23 0454 11/06/23 0515 11/07/23 0429  NA 139 142 139 135 138  K 3.8 3.7 3.7 3.7 3.8  CL 98 103 100 99 101  CO2 33* 30 30 28 31   GLUCOSE 120* 84 117* 81 91  BUN 19 28* 21 24* 29*  CREATININE 0.51 0.59 0.33* 0.61 0.68  CALCIUM 8.9 8.6* 8.7* 8.0* 8.4*  MG  --  1.9 2.1 1.9 2.0  PHOS  --  2.6  --   --   --    Liver Function Tests: No results for input(s): "AST", "ALT", "ALKPHOS", "BILITOT", "PROT", "ALBUMIN" in the last 168 hours. No results for input(s): "LIPASE", "AMYLASE" in the last 168 hours. No results for input(s):  "AMMONIA" in the last 168 hours. CBC: Recent Labs  Lab 11/01/23 1223 11/02/23 0505 11/04/23 0453 11/05/23 0454 11/06/23 0515 11/07/23 0429  WBC 11.6* 11.9* 11.2* 10.9* 12.0* 11.6*  NEUTROABS 10.3*  --   --   --   --   --   HGB 12.0 10.8* 10.7* 11.0* 11.2* 10.7*  HCT 39.6 35.4* 34.5* 34.7* 36.4 34.4*  MCV 94.1 93.7 94.8 91.6 94.3 92.0  PLT 213 232 219 232 227 218   Cardiac Enzymes: No results for input(s): "CKTOTAL", "CKMB", "CKMBINDEX", "TROPONINI" in the last 168 hours. BNP: Invalid input(s): "POCBNP" CBG: No results for input(s): "GLUCAP" in the last 168 hours. D-Dimer No results for input(s): "DDIMER" in the last 72 hours. Hgb A1c No results for input(s): "HGBA1C" in the last 72 hours. Lipid Profile No results for input(s): "CHOL", "HDL", "LDLCALC", "TRIG", "CHOLHDL", "LDLDIRECT" in the last 72 hours. Thyroid function studies No results for input(s): "TSH", "T4TOTAL", "T3FREE", "THYROIDAB" in the last 72 hours.  Invalid input(s): "FREET3" Anemia work up No results for input(s): "VITAMINB12", "FOLATE", "FERRITIN", "TIBC", "IRON", "RETICCTPCT" in the last 72 hours. Urinalysis    Component Value Date/Time   COLORURINE YELLOW 06/25/2023 0536   APPEARANCEUR CLEAR 06/25/2023 0536   LABSPEC 1.023 06/25/2023 0536   PHURINE 6.0 06/25/2023 0536   GLUCOSEU 50 (A) 06/25/2023 0536   HGBUR SMALL (A) 06/25/2023 0536   BILIRUBINUR NEGATIVE 06/25/2023 0536   KETONESUR NEGATIVE 06/25/2023 0536   PROTEINUR NEGATIVE 06/25/2023 0536  UROBILINOGEN 0.2 11/06/2009 1559   NITRITE NEGATIVE 06/25/2023 0536   LEUKOCYTESUR NEGATIVE 06/25/2023 0536   Sepsis Labs Recent Labs  Lab 11/04/23 0453 11/05/23 0454 11/06/23 0515 11/07/23 0429  WBC 11.2* 10.9* 12.0* 11.6*   Microbiology Recent Results (from the past 240 hours)  Resp panel by RT-PCR (RSV, Flu A&B, Covid) Anterior Nasal Swab     Status: None   Collection Time: 11/01/23 12:24 PM   Specimen: Anterior Nasal Swab  Result Value  Ref Range Status   SARS Coronavirus 2 by RT PCR NEGATIVE NEGATIVE Final    Comment: (NOTE) SARS-CoV-2 target nucleic acids are NOT DETECTED.  The SARS-CoV-2 RNA is generally detectable in upper respiratory specimens during the acute phase of infection. The lowest concentration of SARS-CoV-2 viral copies this assay can detect is 138 copies/mL. A negative result does not preclude SARS-Cov-2 infection and should not be used as the sole basis for treatment or other patient management decisions. A negative result may occur with  improper specimen collection/handling, submission of specimen other than nasopharyngeal swab, presence of viral mutation(s) within the areas targeted by this assay, and inadequate number of viral copies(<138 copies/mL). A negative result must be combined with clinical observations, patient history, and epidemiological information. The expected result is Negative.  Fact Sheet for Patients:  BloggerCourse.com  Fact Sheet for Healthcare Providers:  SeriousBroker.it  This test is no t yet approved or cleared by the [place] FDA and  has been authorized for detection and/or diagnosis of SARS-CoV-2 by FDA under an Emergency Use Authorization (EUA). This EUA will remain  in effect (meaning this test can be used) for the duration of the COVID-19 declaration under Section 564(b)(1) of the Act, 21 U.S.C.section 360bbb-3(b)(1), unless the authorization is terminated  or revoked sooner.       Influenza A by PCR NEGATIVE NEGATIVE Final   Influenza B by PCR NEGATIVE NEGATIVE Final    Comment: (NOTE) The Xpert Xpress SARS-CoV-2/FLU/RSV plus assay is intended as an aid in the diagnosis of influenza from Nasopharyngeal swab specimens and should not be used as a sole basis for treatment. Nasal washings and aspirates are unacceptable for Xpert Xpress SARS-CoV-2/FLU/RSV testing.  Fact Sheet for  Patients: BloggerCourse.com  Fact Sheet for Healthcare Providers: SeriousBroker.it  This test is not yet approved or cleared by the [place] FDA and has been authorized for detection and/or diagnosis of SARS-CoV-2 by FDA under an Emergency Use Authorization (EUA). This EUA will remain in effect (meaning this test can be used) for the duration of the COVID-19 declaration under Section 564(b)(1) of the Act, 21 U.S.C. section 360bbb-3(b)(1), unless the authorization is terminated or revoked.     Resp Syncytial Virus by PCR NEGATIVE NEGATIVE Final    Comment: (NOTE) Fact Sheet for Patients: BloggerCourse.com  Fact Sheet for Healthcare Providers: SeriousBroker.it  This test is not yet approved or cleared by the [place] FDA and has been authorized for detection and/or diagnosis of SARS-CoV-2 by FDA under an Emergency Use Authorization (EUA). This EUA will remain in effect (meaning this test can be used) for the duration of the COVID-19 declaration under Section 564(b)(1) of the Act, 21 U.S.C. section 360bbb-3(b)(1), unless the authorization is terminated or revoked.  Performed at [facility], [address] [place], [place] [address]   Respiratory (~20 pathogens) panel by PCR     Status: None   Collection Time: 11/01/23 12:24 PM   Specimen: Nasopharyngeal Swab; Respiratory  Result Value Ref Range Status  Adenovirus NOT DETECTED NOT DETECTED Final   Coronavirus 229E NOT DETECTED NOT DETECTED Final    Comment: (NOTE) The Coronavirus on the Respiratory Panel, DOES NOT test for the novel  Coronavirus (2019 nCoV)    Coronavirus HKU1 NOT DETECTED NOT DETECTED Final   Coronavirus NL63 NOT DETECTED NOT DETECTED Final   Coronavirus OC43 NOT DETECTED NOT DETECTED Final   Metapneumovirus NOT DETECTED NOT DETECTED Final   Rhinovirus / Enterovirus NOT  DETECTED NOT DETECTED Final   Influenza A NOT DETECTED NOT DETECTED Final   Influenza B NOT DETECTED NOT DETECTED Final   Parainfluenza Virus 1 NOT DETECTED NOT DETECTED Final   Parainfluenza Virus 2 NOT DETECTED NOT DETECTED Final   Parainfluenza Virus 3 NOT DETECTED NOT DETECTED Final   Parainfluenza Virus 4 NOT DETECTED NOT DETECTED Final   Respiratory Syncytial Virus NOT DETECTED NOT DETECTED Final   Bordetella pertussis NOT DETECTED NOT DETECTED Final   Bordetella Parapertussis NOT DETECTED NOT DETECTED Final   Chlamydophila pneumoniae NOT DETECTED NOT DETECTED Final   Mycoplasma pneumoniae NOT DETECTED NOT DETECTED Final    Comment: Performed at [facility], [address], [place], [place] [address]     Time coordinating discharge:  I have spent 35 minutes face to face with the patient and on the ward discussing the patients care, assessment, plan and disposition with other care givers. >50% of the time was devoted counseling the patient about the risks and benefits of treatment/Discharge disposition and coordinating care.   SIGNED:   Miguel Rota, MD  [facility] 11/07/2023, 11:17 AM   If 7PM-7AM, please contact night-coverage

## 2023-11-07 NOTE — Progress Notes (Addendum)
 TOC meds delivered to pt in room - dressed for d/c to home. [employer] cab called - dispatch states it will be 30 mins- given pt's number to call when the cab is approaching hospital - primary nurse updated

## 2023-11-07 NOTE — Plan of Care (Signed)
  Problem: Activity: Goal: Risk for activity intolerance will decrease Outcome: Completed/Met   Problem: Nutrition: Goal: Adequate nutrition will be maintained Outcome: Completed/Met   

## 2023-11-07 NOTE — Plan of Care (Signed)

## 2023-11-07 NOTE — Progress Notes (Signed)
 SATURATION QUALIFICATIONS: (This note is used to comply with regulatory documentation for home oxygen)  Patient Saturations on Room Air at Rest = 86%  Patient Saturations on Room Air while Ambulating = 83%  Patient Saturations on 2 Liters of oxygen while Ambulating = 94%  Please briefly explain why patient needs home oxygen: patient's O2 drops below 88 while at rest or ambulating. She needs O2 to maintain proper o2 saturations.

## 2023-11-07 NOTE — Care Management Important Message (Signed)
 Important Message  Patient Details IM Letter given to Patient. Name: Tracy Park MRN: [id number] Date of Birth: 1956/06/11   Important Message Given:  Yes - Medicare IM     Caren Macadam 11/07/2023, 11:58 AM

## 2023-11-25 ENCOUNTER — Emergency Department (HOSPITAL_COMMUNITY)

## 2023-11-25 ENCOUNTER — Emergency Department (HOSPITAL_COMMUNITY)
Admission: EM | Admit: 2023-11-25 | Discharge: 2023-11-26 | Disposition: A | Discharge status: Home / Self Care | Attending: Emergency Medicine | Admitting: Emergency Medicine

## 2023-11-25 ENCOUNTER — Encounter (HOSPITAL_COMMUNITY): Payer: Self-pay

## 2023-11-25 ENCOUNTER — Other Ambulatory Visit: Payer: Self-pay

## 2023-11-25 DIAGNOSIS — Z85118 Personal history of other malignant neoplasm of bronchus and lung: Secondary | ICD-10-CM | POA: Diagnosis not present

## 2023-11-25 DIAGNOSIS — Z7901 Long term (current) use of anticoagulants: Secondary | ICD-10-CM | POA: Diagnosis not present

## 2023-11-25 DIAGNOSIS — J181 Lobar pneumonia, unspecified organism: Secondary | ICD-10-CM | POA: Diagnosis not present

## 2023-11-25 DIAGNOSIS — R0602 Shortness of breath: Secondary | ICD-10-CM | POA: Diagnosis present

## 2023-11-25 LAB — CBC WITH DIFFERENTIAL/PLATELET
Abs Immature Granulocytes: 0.02 10*3/uL (ref 0.00–0.07)
Basophils Absolute: 0 10*3/uL (ref 0.0–0.1)
Basophils Relative: 0 %
Eosinophils Absolute: 0.1 10*3/uL (ref 0.0–0.5)
Eosinophils Relative: 1 %
HCT: 42.5 % (ref 36.0–46.0)
Hemoglobin: 12.8 g/dL (ref 12.0–15.0)
Immature Granulocytes: 0 %
Lymphocytes Relative: 28 %
Lymphs Abs: 3.1 10*3/uL (ref 0.7–4.0)
MCH: 28.9 pg (ref 26.0–34.0)
MCHC: 30.1 g/dL (ref 30.0–36.0)
MCV: 95.9 fL (ref 80.0–100.0)
Monocytes Absolute: 0.8 10*3/uL (ref 0.1–1.0)
Monocytes Relative: 7 %
Neutro Abs: 6.8 10*3/uL (ref 1.7–7.7)
Neutrophils Relative %: 64 %
Platelets: 238 10*3/uL (ref 150–400)
RBC: 4.43 MIL/uL (ref 3.87–5.11)
RDW: 13.9 % (ref 11.5–15.5)
WBC: 10.8 10*3/uL — ABNORMAL HIGH (ref 4.0–10.5)
nRBC: 0 % (ref 0.0–0.2)

## 2023-11-25 LAB — COMPREHENSIVE METABOLIC PANEL
ALT: 14 U/L (ref 0–44)
AST: 15 U/L (ref 15–41)
Albumin: 3.8 g/dL (ref 3.5–5.0)
Alkaline Phosphatase: 67 U/L (ref 38–126)
Anion gap: 9 (ref 5–15)
BUN: 16 mg/dL (ref 8–23)
CO2: 32 mmol/L (ref 22–32)
Calcium: 8.9 mg/dL (ref 8.9–10.3)
Chloride: 98 mmol/L (ref 98–111)
Creatinine, Ser: 0.64 mg/dL (ref 0.44–1.00)
GFR, Estimated: >60 mL/min (ref >60)
Glucose, Bld: 85 mg/dL (ref 70–99)
Potassium: 3.9 mmol/L (ref 3.5–5.1)
Sodium: 139 mmol/L (ref 135–145)
Total Bilirubin: 0.7 mg/dL (ref 0.0–1.2)
Total Protein: 7.1 g/dL (ref 6.5–8.1)

## 2023-11-25 LAB — TROPONIN I (HIGH SENSITIVITY): Troponin I (High Sensitivity): 6 ng/L (ref <18)

## 2023-11-25 MED ORDER — PREDNISONE 20 MG PO TABS: 40.0000 mg | ORAL_TABLET | Freq: Every day | ORAL | 0 refills | Status: AC

## 2023-11-25 MED ORDER — IOHEXOL 350 MG/ML SOLN
75.0000 mL | Freq: Once | INTRAVENOUS | Status: AC | PRN
  Administered 2023-11-25: 75 mL via INTRAVENOUS

## 2023-11-25 MED ORDER — AMOXICILLIN-POT CLAVULANATE 875-125 MG PO TABS: 1.0000 | ORAL_TABLET | Freq: Two times a day (BID) | ORAL | 0 refills | Status: DC

## 2023-11-25 MED ORDER — DOXYCYCLINE HYCLATE 100 MG PO CAPS: 100.0000 mg | ORAL_CAPSULE | Freq: Two times a day (BID) | ORAL | 0 refills | Status: DC

## 2023-11-25 MED ORDER — SODIUM CHLORIDE 0.9 % IV SOLN
1.0000 g | Freq: Once | INTRAVENOUS | Status: AC
  Administered 2023-11-25: 1 g via INTRAVENOUS
  Filled 2023-11-25: qty 10

## 2023-11-25 MED ORDER — IPRATROPIUM-ALBUTEROL 0.5-2.5 (3) MG/3ML IN SOLN
3.0000 mL | Freq: Once | RESPIRATORY_TRACT | Status: AC
  Administered 2023-11-25: 3 mL via RESPIRATORY_TRACT
  Filled 2023-11-25: qty 3

## 2023-11-25 MED ORDER — METHYLPREDNISOLONE SODIUM SUCC 125 MG IJ SOLR
125.0000 mg | Freq: Once | INTRAMUSCULAR | Status: AC
  Administered 2023-11-25: 125 mg via INTRAVENOUS
  Filled 2023-11-25: qty 2

## 2023-11-25 NOTE — ED Triage Notes (Signed)
 EMS reports from home, called out for SOB and pain in left lung. Hx of CA with removed upper lobe. Pt 2ltrs 24-7 hypoxic at scene at 86 given 4ltrs on scene came up to 95.  BP 112/68 HR 90 RR 16 Sp02 95 @ 4 ltrs

## 2023-11-25 NOTE — Discharge Instructions (Addendum)
 You have a consolidation in your lung, this may be possible cancer, or it may be due to pneumonia.  I prescribed you some antibiotics, to treat for possible pneumonia.  Continue with your DuoNebs every 6 hours, or at least 4 times a day.  Use your albuterol inhaler, as needed.  I have also prescribed you some steroids, to help with inflammation.  Please follow-up with your PCP, and the cancer center.  I have sent in referral

## 2023-11-25 NOTE — ED Provider Notes (Addendum)
  EMERGENCY DEPARTMENT AT [facility] Provider Note   CSN: [id number] Arrival date & time: 11/25/23  1729     History  Chief Complaint  Patient presents with   Shortness of Breath    Tracy Park is a 68 y.o. female, history of lung cancer in remission, MI, paroxysmal A-fib, on Eliquis, who presents to the ED secondary to shortness of breath, and cough it has been going on for the last couple days.  She states she started having shortness of breath, cough yesterday, is progressively gotten worse.  She states it hurts to cough, on her right breast, and she does not know if she broke her rib from coughing so much.  She notes that she is having some clear sputum, and had night sweats the other night.  Denies any runny nose, sore throat, or headache.  Per EMS, patient was found on 2 L, at 86%, they put her on 4 L, and she bumped up to 95%.  She typically wears 2 L at rest.    Home Medications Prior to Admission medications   Medication Sig Start Date End Date Taking? Authorizing Provider  amoxicillin-clavulanate (AUGMENTIN) 875-125 MG tablet Take 1 tablet by mouth every 12 (twelve) hours. 11/25/23  Yes Bertram Haddix L, PA  doxycycline (VIBRAMYCIN) 100 MG capsule Take 1 capsule (100 mg total) by mouth 2 (two) times daily. 11/25/23  Yes Tay Whitwell L, PA  predniSONE (DELTASONE) 20 MG tablet Take 2 tablets (40 mg total) by mouth daily for 5 days. 11/25/23 11/30/23 Yes Sheffield Hawker L, PA  albuterol (PROVENTIL) (2.5 MG/3ML) 0.083% nebulizer solution Take 3 mLs (2.5 mg total) by nebulization every 4 (four) hours while awake for 3 days, THEN 3 mLs (2.5 mg total) every 4 (four) hours as needed for wheezing or shortness of breath. Patient taking differently: Inhale 3 mLs (2.5 mg total) every 4 (four) hours as needed for wheezing or shortness of breath. 10/18/23 11/20/23  Charlynne Pander, MD  albuterol (VENTOLIN HFA) 108 (90 Base) MCG/ACT inhaler Inhale 2 puffs into the lungs  every 4 (four) hours as needed for wheezing or shortness of breath. 10/18/23   Charlynne Pander, MD  albuterol (VENTOLIN HFA) 108 (90 Base) MCG/ACT inhaler Inhale 2 puffs into the lungs every 6 (six) hours as needed for wheezing or shortness of breath. 11/07/23   Amin, Ankit C, MD  apixaban (ELIQUIS) 5 MG TABS tablet Take 1 tablet (5 mg total) by mouth 2 (two) times daily. 06/06/23   Jonah Blue, MD  Budeson-Glycopyrrol-Formoterol (BREZTRI AEROSPHERE) 160-9-4.8 MCG/ACT AERO Inhale 1 puff into the lungs in the morning and at bedtime.    [provider]  ipratropium-albuterol (DUONEB) 0.5-2.5 (3) MG/3ML SOLN Take 3 mLs by nebulization every 6 (six) hours as needed. 10/21/23   Matsue Strom L, PA  levothyroxine (SYNTHROID) 125 MCG tablet Take 125 mcg by mouth daily before breakfast.    [provider]  losartan (COZAAR) 25 MG tablet Take 12.5 mg by mouth daily. 08/29/23   [provider]  magnesium oxide (MAG-OX) 400 (240 Mg) MG tablet Take 400 mg by mouth in the morning.    [provider]  metoprolol succinate (TOPROL-XL) 25 MG 24 hr tablet Take 0.5 tablets (12.5 mg total) by mouth daily. 06/22/23 11/02/23  Narda Bonds, MD  nitroGLYCERIN (NITROSTAT) 0.4 MG SL tablet Place 0.4 mg under the tongue every 5 (five) minutes as needed for chest pain.    [provider]  oxyCODONE (ROXICODONE) 5 MG immediate release tablet Take 1 tablet (5 mg total) by mouth every 6 (six) hours as needed for severe pain (pain score 7-10). Patient taking differently: Take 2.5-5 mg by mouth every 6 (six) hours as needed for severe pain (pain score 7-10). 09/12/23   Zadie Rhine, MD  OXYGEN Inhale 2 L/min into the lungs continuous.    [provider]  rosuvastatin (CRESTOR) 20 MG tablet Take 20 mg by mouth daily.    [provider]  sertraline (ZOLOFT) 25 MG tablet Take 25 mg by mouth in the morning.    [provider]  SYSTANE COMPLETE PF 0.6 % SOLN  Place 1 drop into both eyes 4 (four) times daily.    [provider]  TYLENOL 8 HOUR 650 MG CR tablet Take 650-1,300 mg by mouth 2 (two) times daily as needed for pain.    [provider]      Allergies    Red dye #40 (allura red), Strawberry extract, Tomato, Aspirin, Tape, and Wound dressing adhesive    Review of Systems   Review of Systems  Constitutional:  Negative for fever.  Respiratory:  Positive for cough and shortness of breath.     Physical Exam Updated Vital Signs BP 108/76   Pulse 80   Temp (!) 97.5 F (36.4 C) (Oral)   Resp (!) 22   SpO2 94%  Physical Exam Vitals and nursing note reviewed.  Constitutional:      General: She is not in acute distress.    Appearance: She is well-developed.  HENT:     Head: Normocephalic and atraumatic.  Eyes:     Conjunctiva/sclera: Conjunctivae normal.  Cardiovascular:     Rate and Rhythm: Normal rate and regular rhythm.     Heart sounds: No murmur heard. Pulmonary:     Effort: Pulmonary effort is normal.     Breath sounds: Rhonchi present.  Abdominal:     Palpations: Abdomen is soft.     Tenderness: There is no abdominal tenderness.  Musculoskeletal:        General: No swelling.     Cervical back: Neck supple.  Skin:    General: Skin is warm and dry.     Capillary Refill: Capillary refill takes less than 2 seconds.  Neurological:     Mental Status: She is alert.  Psychiatric:        Mood and Affect: Mood normal.     ED Results / Procedures / Treatments   Labs (all labs ordered are listed, but only abnormal results are displayed) Labs Reviewed  CBC WITH DIFFERENTIAL/PLATELET - Abnormal; Notable for the following components:      Result Value   WBC 10.8 (*)    All other components within normal limits  COMPREHENSIVE METABOLIC PANEL  TROPONIN I (HIGH SENSITIVITY)    EKG None  Radiology CT Angio Chest PE W and/or Wo Contrast Result Date: 11/25/2023 CLINICAL DATA:  Short of breath, left  chest pain, history of lung cancer EXAM: CT ANGIOGRAPHY CHEST WITH CONTRAST TECHNIQUE: Multidetector CT imaging of the chest was performed using the standard protocol during bolus administration of intravenous contrast. Multiplanar CT image reconstructions and MIPs were obtained to evaluate the vascular anatomy. RADIATION DOSE REDUCTION: This exam was performed according to the departmental dose-optimization program which includes automated exposure control, adjustment of the mA and/or kV according to patient size and/or use of iterative reconstruction technique. CONTRAST:  75mL OMNIPAQUE IOHEXOL 350 MG/ML SOLN COMPARISON:  11/25/2023,  08/03/2023, 03/30/2022 FINDINGS: Cardiovascular: This is a technically adequate evaluation of the pulmonary vasculature. No filling defects or pulmonary emboli. The heart is unremarkable without pericardial effusion. No evidence of thoracic aortic aneurysm or dissection. Atherosclerosis of the aorta and coronary vasculature. Mediastinum/Nodes: No enlarged mediastinal, hilar, or axillary lymph nodes. Thyroid gland, trachea, and esophagus demonstrate no significant findings. Lungs/Pleura: Prior left upper lobectomy with associated left-sided volume loss. Bullous changes and chronic scarring are again seen within the superior segment of the left lower lobe. Stable emphysema. There is increasing subpleural consolidation within the right upper lobe abutting the major fissure, measuring 2.1 x 0.9 cm reference image 34/4. No acute airspace disease, effusion, or pneumothorax. Central airways are patent. Upper Abdomen: No acute abnormality. Musculoskeletal: No acute or destructive bony abnormalities. Chronic mild compression deformity superior endplate of the T11 vertebral body. Reconstructed images demonstrate no additional findings. Review of the MIP images confirms the above findings. IMPRESSION: 1. No evidence of pulmonary embolus. 2. Chronic postsurgical changes from left upper lobectomy,  with cystic changes and scarring within the superior segment of the left lower lobe. 3. Increasing subpleural consolidation within the right upper lobe abutting the major fissure, measuring up to 2.1 x 0.9 cm. Given the patient's history of lung cancer and the progression since prior exams, further evaluation with PET scan may be useful. 4. Aortic Atherosclerosis (ICD10-I70.0) and Emphysema (ICD10-J43.9). Electronically Signed   By: Sharlet Salina M.D.   On: 11/25/2023 22:39   DG Chest 2 View Result Date: 11/25/2023 CLINICAL DATA:  Shortness of breath.  History of lung cancer. EXAM: CHEST - 2 VIEW COMPARISON:  November 01, 2023. FINDINGS: The heart size and mediastinal contours are within normal limits. Stable scarring seen in left upper lobe consistent with surgery. Emphysematous disease is again noted. No definite acute abnormality seen. The visualized skeletal structures are unremarkable. IMPRESSION: Chronic findings as noted above. No definite acute abnormality noted. Electronically Signed   By: Lupita Raider M.D.   On: 11/25/2023 20:01    Procedures Procedures    Medications Ordered in ED Medications  cefTRIAXone (ROCEPHIN) 1 g in sodium chloride 0.9 % 100 mL IVPB (1 g Intravenous New Bag/Given 11/25/23 2304)  ipratropium-albuterol (DUONEB) 0.5-2.5 (3) MG/3ML nebulizer solution 3 mL (3 mLs Nebulization Given 11/25/23 1816)  methylPREDNISolone sodium succinate (SOLU-MEDROL) 125 mg/2 mL injection 125 mg (125 mg Intravenous Given 11/25/23 1820)  iohexol (OMNIPAQUE) 350 MG/ML injection 75 mL (75 mLs Intravenous Contrast Given 11/25/23 2126)    ED Course/ Medical Decision Making/ A&P                                 Medical Decision Making Patient is a 68 year old female, overall well-appearing on exam, here for shortness of breath, and chest pain, as well as some clear sputum production.  She also reports she is having some night sweats, and is lost some weight.  She was found by EMS, hypoxic,  on 2 L of O2, which is her baseline.  Given her history, we will obtain a chest x-ray to start with, as well as blood work.  Will give DuoNeb, given rhonchi, and steroids, to help with possible COPD exacerbation.  Amount and/or Complexity of Data Reviewed Labs: ordered.    Details: Blood work is unremarkable, troponin negative Radiology: ordered.    Details: Chest x-ray shows no acute findings, CTA ordered, shows increasing subpleural consolidation within the right upper lobe  Discussion of management or test interpretation with external provider(s): Discussed with patient, CTA shows increasing subpleural consolidation within the right upper lobe, given her history of lung cancer, concern for possible lung cancer recurrence.  This also could be pneumonia, thus we will treat her with Augmentin, and doxycycline outpatient.  She is satting within normal limits, on her typical 2 L of O2.  She is well-appearing, not stridorous, and not tripoding or having any retractions.  She is overall well-appearing, I discussed the concern for possible lung cancer recurrence, and she voiced understanding, I sent a referral for the cancer center, she will need to follow-up with this.  Continue the DuoNebs, as prescribed.  She voiced understanding was discharged home.  Lungs sound better, after DuoNeb, and steroids.  Will treat her for possible COPD exacerbation as well, so steroids sent to the pharmacy.  Risk Prescription drug management.   Final Clinical Impression(s) / ED Diagnoses Final diagnoses:  Consolidation of right upper lobe of lung (HCC)  SOB (shortness of breath)  History of lung cancer    Rx / DC Orders ED Discharge Orders          Ordered    Ambulatory referral to Hematology / Oncology       Comments: Your emergency department provider has referred you to see a hematology/oncology specialist. These are physicians who specialize in blood disorders and cancers, or findings concerning for cancer. You  will receive a phone call from the [facility] Office to set up your appointment within 2 business days: [employer] operate Mon - Fri, 8:00 a.m. to 5:00 p.m.; closed for federally recognized holidays. Please be sure your phone is not set to block numbers during this time.   11/25/23 2307    doxycycline (VIBRAMYCIN) 100 MG capsule  2 times daily        11/25/23 2312    amoxicillin-clavulanate (AUGMENTIN) 875-125 MG tablet  Every 12 hours        11/25/23 2312    predniSONE (DELTASONE) 20 MG tablet  Daily        11/25/23 2312              Rameen Quinney Elbert Ewings, PA 11/25/23 2321    Vu Liebman, Harley Alto, PA 11/25/23 2321    Royanne Foots, DO 12/01/23 [phone]

## 2023-11-25 NOTE — ED Notes (Signed)
 Gave pt a sprite RN made aware

## 2023-12-03 ENCOUNTER — Encounter (HOSPITAL_COMMUNITY): Payer: Self-pay

## 2023-12-03 ENCOUNTER — Other Ambulatory Visit: Payer: Self-pay

## 2023-12-03 ENCOUNTER — Emergency Department (HOSPITAL_COMMUNITY)
Admission: EM | Admit: 2023-12-03 | Discharge: 2023-12-03 | Disposition: A | Discharge status: Home / Self Care | Attending: Emergency Medicine | Admitting: Emergency Medicine

## 2023-12-03 ENCOUNTER — Emergency Department (HOSPITAL_COMMUNITY)

## 2023-12-03 DIAGNOSIS — J189 Pneumonia, unspecified organism: Secondary | ICD-10-CM | POA: Diagnosis not present

## 2023-12-03 DIAGNOSIS — Z7951 Long term (current) use of inhaled steroids: Secondary | ICD-10-CM | POA: Diagnosis not present

## 2023-12-03 DIAGNOSIS — J45909 Unspecified asthma, uncomplicated: Secondary | ICD-10-CM | POA: Diagnosis not present

## 2023-12-03 DIAGNOSIS — R0602 Shortness of breath: Secondary | ICD-10-CM | POA: Diagnosis present

## 2023-12-03 DIAGNOSIS — Z85118 Personal history of other malignant neoplasm of bronchus and lung: Secondary | ICD-10-CM | POA: Diagnosis not present

## 2023-12-03 DIAGNOSIS — Z7952 Long term (current) use of systemic steroids: Secondary | ICD-10-CM | POA: Diagnosis not present

## 2023-12-03 DIAGNOSIS — Z7901 Long term (current) use of anticoagulants: Secondary | ICD-10-CM | POA: Diagnosis not present

## 2023-12-03 DIAGNOSIS — J449 Chronic obstructive pulmonary disease, unspecified: Secondary | ICD-10-CM | POA: Insufficient documentation

## 2023-12-03 LAB — COMPREHENSIVE METABOLIC PANEL
ALT: 11 U/L (ref 0–44)
AST: 15 U/L (ref 15–41)
Albumin: 3.7 g/dL (ref 3.5–5.0)
Alkaline Phosphatase: 70 U/L (ref 38–126)
Anion gap: 9 (ref 5–15)
BUN: 15 mg/dL (ref 8–23)
CO2: 35 mmol/L — ABNORMAL HIGH (ref 22–32)
Calcium: 9.3 mg/dL (ref 8.9–10.3)
Chloride: 98 mmol/L (ref 98–111)
Creatinine, Ser: 0.53 mg/dL (ref 0.44–1.00)
GFR, Estimated: >60 mL/min (ref >60)
Glucose, Bld: 107 mg/dL — ABNORMAL HIGH (ref 70–99)
Potassium: 3.5 mmol/L (ref 3.5–5.1)
Sodium: 142 mmol/L (ref 135–145)
Total Bilirubin: 0.2 mg/dL (ref 0.0–1.2)
Total Protein: 7 g/dL (ref 6.5–8.1)

## 2023-12-03 LAB — CBC WITH DIFFERENTIAL/PLATELET
Abs Immature Granulocytes: 0.02 10*3/uL (ref 0.00–0.07)
Basophils Absolute: 0 10*3/uL (ref 0.0–0.1)
Basophils Relative: 1 %
Eosinophils Absolute: 0 10*3/uL (ref 0.0–0.5)
Eosinophils Relative: 1 %
HCT: 42.2 % (ref 36.0–46.0)
Hemoglobin: 12.6 g/dL (ref 12.0–15.0)
Immature Granulocytes: 0 %
Lymphocytes Relative: 36 %
Lymphs Abs: 3.2 10*3/uL (ref 0.7–4.0)
MCH: 28.9 pg (ref 26.0–34.0)
MCHC: 29.9 g/dL — ABNORMAL LOW (ref 30.0–36.0)
MCV: 96.8 fL (ref 80.0–100.0)
Monocytes Absolute: 0.7 10*3/uL (ref 0.1–1.0)
Monocytes Relative: 8 %
Neutro Abs: 4.8 10*3/uL (ref 1.7–7.7)
Neutrophils Relative %: 54 %
Platelets: 259 10*3/uL (ref 150–400)
RBC: 4.36 MIL/uL (ref 3.87–5.11)
RDW: 13.5 % (ref 11.5–15.5)
WBC: 8.8 10*3/uL (ref 4.0–10.5)
nRBC: 0 % (ref 0.0–0.2)

## 2023-12-03 LAB — RESP PANEL BY RT-PCR (RSV, FLU A&B, COVID)  RVPGX2
Influenza A by PCR: NEGATIVE
Influenza B by PCR: NEGATIVE
Resp Syncytial Virus by PCR: NEGATIVE
SARS Coronavirus 2 by RT PCR: NEGATIVE

## 2023-12-03 LAB — TROPONIN I (HIGH SENSITIVITY): Troponin I (High Sensitivity): 6 ng/L (ref <18)

## 2023-12-03 MED ORDER — OXYCODONE HCL 5 MG PO TABS
5.0000 mg | ORAL_TABLET | Freq: Once | ORAL | Status: AC
  Administered 2023-12-03: 5 mg via ORAL
  Filled 2023-12-03: qty 1

## 2023-12-03 MED ORDER — OXYCODONE HCL 5 MG PO TABS: 5.0000 mg | ORAL_TABLET | Freq: Four times a day (QID) | ORAL | 0 refills | Status: DC | PRN

## 2023-12-03 MED ORDER — DOXYCYCLINE HYCLATE 100 MG PO CAPS: 100.0000 mg | ORAL_CAPSULE | Freq: Two times a day (BID) | ORAL | 0 refills | Status: DC

## 2023-12-03 MED ORDER — METHYLPREDNISOLONE SODIUM SUCC 125 MG IJ SOLR: 125.0000 mg | Freq: Every day | INTRAMUSCULAR | Status: DC

## 2023-12-03 MED ORDER — IPRATROPIUM-ALBUTEROL 0.5-2.5 (3) MG/3ML IN SOLN
3.0000 mL | Freq: Once | RESPIRATORY_TRACT | Status: AC
  Administered 2023-12-03: 3 mL via RESPIRATORY_TRACT
  Filled 2023-12-03: qty 3

## 2023-12-03 MED ORDER — PREDNISONE 20 MG PO TABS: 60.0000 mg | ORAL_TABLET | Freq: Every day | ORAL | 0 refills | Status: AC

## 2023-12-03 MED ORDER — DOXYCYCLINE HYCLATE 100 MG PO TABS
100.0000 mg | ORAL_TABLET | Freq: Once | ORAL | Status: AC
  Administered 2023-12-03: 100 mg via ORAL
  Filled 2023-12-03: qty 1

## 2023-12-03 NOTE — ED Triage Notes (Signed)
 BIB EMS from home for sob, diagnosed with PNA a few days ago. Pt states she did not pick up her abx, she states she wasn't aware she needed them. [place] 2L at baseline.

## 2023-12-03 NOTE — Discharge Instructions (Addendum)
 Follow-up with your oncologist to discuss PET scan or repeat CT imaging in the future.  Take your next dose of antibiotic doxycycline next dose of steroid prednisone tomorrow.  Take Roxicodone for breakthrough pain.  This medication is sedating so please be careful with its use.

## 2023-12-03 NOTE — ED Provider Triage Note (Signed)
 Emergency Medicine Provider Triage Evaluation Note  Tracy Park , a 68 y.o. female  was evaluated in triage.  Pt complains of SOB since last night. The patient was diagnosed with PNA last week. She reports worsening SOB since last night, worse on exertion. She was seen on 11/25/23 and was given antibiotics and prednisone but did not pick these up as she didn't know there was any sent in. On 2L chronically.  EMS was given 2 duonebs and 125mg  of solumedrol. Was not hypoxic on her 2L with EMS transport. Seems to be improving with the treatments.   Review of Systems  Positive:  Negative:   Physical Exam  There were no vitals taken for this visit. Gen:   Awake, no distress   Resp:  Normal effort, speaking in full sentences. She has some rales in the LL lobe. Inspiratory and expiratory wheezing heard throughout all lung fields.  MSK:   Moves extremities without difficulty  Other:    Medical Decision Making  Medically screening exam initiated at 3:19 PM.  Appropriate orders placed.  Tracy Park was informed that the remainder of the evaluation will be completed by another provider, this initial triage assessment does not replace that evaluation, and the importance of remaining in the ED until their evaluation is complete.  Patient does not appear in any acute distress, but given history, alerted nursing that she needs to be roomed next.    Achille Rich, PA-C 12/03/23 1524

## 2023-12-03 NOTE — ED Provider Notes (Signed)
 [facility] [facility]MENT AT [facility] Provider Note   CSN: [id number] Arrival date & time: 12/03/23  1513     History  Chief Complaint  Patient presents with   Shortness of Breath    Tracy Park is a 68 y.o. female.    Patient here with ongoing shortness of breath was diagnosed with pneumonia recently but did not pick up antibiotics.  History of lung cancer on 2 L of oxygen chronically.  She is in remission from cancer standpoint.  She uses breathing treatments at home.  She denies any chest pain weakness numbness tingling.  Maybe some sputum production.  Denies any abdominal pain nausea vomit diarrhea.  The history is provided by the patient.       Home Medications Prior to Admission medications   Medication Sig Start Date End Date Taking? Authorizing Provider  doxycycline (VIBRAMYCIN) 100 MG capsule Take 1 capsule (100 mg total) by mouth 2 (two) times daily. 12/03/23  Yes Nahmir Zeidman, DO  oxyCODONE (ROXICODONE) 5 MG immediate release tablet Take 1 tablet (5 mg total) by mouth every 6 (six) hours as needed for severe pain (pain score 7-10). 12/03/23  Yes Jerome Otter, DO  predniSONE (DELTASONE) 20 MG tablet Take 3 tablets (60 mg total) by mouth daily for 4 days. 12/03/23 12/07/23 Yes Ulysess Witz, DO  albuterol (PROVENTIL) (2.5 MG/3ML) 0.083% nebulizer solution Take 3 mLs (2.5 mg total) by nebulization every 4 (four) hours while awake for 3 days, THEN 3 mLs (2.5 mg total) every 4 (four) hours as needed for wheezing or shortness of breath. Patient taking differently: Inhale 3 mLs (2.5 mg total) every 4 (four) hours as needed for wheezing or shortness of breath. 10/18/23 11/20/23  Charlynne Pander, MD  albuterol (VENTOLIN HFA) 108 (90 Base) MCG/ACT inhaler Inhale 2 puffs into the lungs every 4 (four) hours as needed for wheezing or shortness of breath. 10/18/23   Charlynne Pander, MD  albuterol (VENTOLIN HFA) 108 (90 Base) MCG/ACT inhaler Inhale 2 puffs into  the lungs every 6 (six) hours as needed for wheezing or shortness of breath. 11/07/23   Amin, Ankit C, MD  amoxicillin-clavulanate (AUGMENTIN) 875-125 MG tablet Take 1 tablet by mouth every 12 (twelve) hours. 11/25/23   Small, Brooke L, PA  apixaban (ELIQUIS) 5 MG TABS tablet Take 1 tablet (5 mg total) by mouth 2 (two) times daily. 06/06/23   Jonah Blue, MD  Budeson-Glycopyrrol-Formoterol (BREZTRI AEROSPHERE) 160-9-4.8 MCG/ACT AERO Inhale 1 puff into the lungs in the morning and at bedtime.    [provider]  ipratropium-albuterol (DUONEB) 0.5-2.5 (3) MG/3ML SOLN Take 3 mLs by nebulization every 6 (six) hours as needed. 10/21/23   Small, Brooke L, PA  levothyroxine (SYNTHROID) 125 MCG tablet Take 125 mcg by mouth daily before breakfast.    [provider]  losartan (COZAAR) 25 MG tablet Take 12.5 mg by mouth daily. 08/29/23   [provider]  magnesium oxide (MAG-OX) 400 (240 Mg) MG tablet Take 400 mg by mouth in the morning.    [provider]  metoprolol succinate (TOPROL-XL) 25 MG 24 hr tablet Take 0.5 tablets (12.5 mg total) by mouth daily. 06/22/23 11/02/23  Narda Bonds, MD  nitroGLYCERIN (NITROSTAT) 0.4 MG SL tablet Place 0.4 mg under the tongue every 5 (five) minutes as needed for chest pain.    [provider]  OXYGEN Inhale 2 L/min into the lungs continuous.    [provider]  rosuvastatin (CRESTOR)  20 MG tablet Take 20 mg by mouth daily.    [provider]  sertraline (ZOLOFT) 25 MG tablet Take 25 mg by mouth in the morning.    [provider]  SYSTANE COMPLETE PF 0.6 % SOLN Place 1 drop into both eyes 4 (four) times daily.    [provider]  TYLENOL 8 HOUR 650 MG CR tablet Take 650-1,300 mg by mouth 2 (two) times daily as needed for pain.    [provider]      Allergies    Red dye #40 (allura red), Strawberry extract, Tomato, Aspirin, Tape, and Wound dressing adhesive    Review of Systems    Review of Systems  Physical Exam Updated Vital Signs BP 103/68   Pulse 93   Temp 99.6 F (37.6 C) (Oral)   Resp (!) 24   Ht 5\' 3"  (1.6 m)   Wt 43 kg   SpO2 95%   BMI 16.79 kg/m  Physical Exam Vitals and nursing note reviewed.  Constitutional:      General: She is not in acute distress.    Appearance: She is well-developed. She is not ill-appearing.  HENT:     Head: Normocephalic and atraumatic.     Mouth/Throat:     Mouth: Mucous membranes are moist.     Pharynx: Oropharynx is clear.  Eyes:     Extraocular Movements: Extraocular movements intact.     Conjunctiva/sclera: Conjunctivae normal.     Pupils: Pupils are equal, round, and reactive to light.  Cardiovascular:     Rate and Rhythm: Normal rate and regular rhythm.     Pulses: Normal pulses.     Heart sounds: Normal heart sounds. No murmur heard. Pulmonary:     Effort: Pulmonary effort is normal. No respiratory distress.     Breath sounds: Wheezing present.  Abdominal:     Palpations: Abdomen is soft.     Tenderness: There is no abdominal tenderness.  Musculoskeletal:        General: No swelling. Normal range of motion.     Cervical back: Normal range of motion and neck supple.     Right lower leg: No edema.     Left lower leg: No edema.  Skin:    General: Skin is warm and dry.     Capillary Refill: Capillary refill takes less than 2 seconds.  Neurological:     General: No focal deficit present.     Mental Status: She is alert.  Psychiatric:        Mood and Affect: Mood normal.     ED Results / Procedures / Treatments   Labs (all labs ordered are listed, but only abnormal results are displayed) Labs Reviewed  CBC WITH DIFFERENTIAL/PLATELET - Abnormal; Notable for the following components:      Result Value   MCHC 29.9 (*)    All other components within normal limits  COMPREHENSIVE METABOLIC PANEL - Abnormal; Notable for the following components:   CO2 35 (*)    Glucose, Bld 107 (*)    All other  components within normal limits  RESP PANEL BY RT-PCR (RSV, FLU A&B, COVID)  [id number]  TROPONIN I (HIGH SENSITIVITY)    EKG EKG Interpretation Date/Time:  Wednesday December 03 2023 15:34:48 EDT Ventricular Rate:  99 PR Interval:  127 QRS Duration:  109 QT Interval:  374 QTC Calculation: 480 R Axis:   -60  Text Interpretation: Sinus rhythm RAE, consider biatrial enlargement RSR' in V1 or V2,  probably normal variant Confirmed by Virgina Norfolk [phone]) on 12/03/2023 3:45:04 PM  Radiology DG Chest 2 View Result Date: 12/03/2023 CLINICAL DATA:  Shortness of breath EXAM: CHEST - 2 VIEW COMPARISON:  11/25/2023 chest x-ray and CT, 06/24/2023 FINDINGS: Chronic postsurgical changes and pleuroparenchymal scarring at left apex. Vague right upper lobe pulmonary opacity. Stable cardiomediastinal silhouette. No pneumothorax IMPRESSION: 1. Vague right upper lobe pulmonary opacity, possible pneumonia. Imaging follow-up to resolution is recommended 2. Chronic postsurgical changes and pleuroparenchymal scarring at left apex. Electronically Signed   By: Jasmine Pang M.D.   On: 12/03/2023 16:54    Procedures Procedures    Medications Ordered in ED Medications  ipratropium-albuterol (DUONEB) 0.5-2.5 (3) MG/3ML nebulizer solution 3 mL (3 mLs Nebulization Given 12/03/23 1727)  oxyCODONE (Oxy IR/ROXICODONE) immediate release tablet 5 mg (5 mg Oral Given 12/03/23 1728)  doxycycline (VIBRA-TABS) tablet 100 mg (100 mg Oral Given 12/03/23 1728)    ED Course/ Medical Decision Making/ A&P                                 Medical Decision Making Risk Prescription drug management.   Jeslyn L Archey here with shortness of breath.  Normal vitals.  No fever.  Well-appearing no increased work of breathing.  Looks stable on 2 L of oxygen.  She does have wheezing on exam.  She is given steroids and breathing treatments EMS.  She is on blood thinners for A-fib.  EKG shows sinus rhythm.  No ischemic changes.  Per my chart  review she had a CT scan about a week ago that showed no pneumonia or PE.  Some increased consolidation of a pleural area in the right lung which is increasing in size and they recommended outpatient PET scanning or follow-up with her oncology team.  Ultimately I think she likely has a bronchitis or viral process or inflammatory process.  She is on blood thinners and doubt PE.  Will give her additional breathing treatment, her oxycodone check basic labs including troponin chest x-ray CBC CMP.  Per my review and interpretation the labs no significant anemia leukocytosis or electrolyte abnormality.  COVID flu RSV test negative.  Troponin negative.  Chest x-ray may be with possible pneumonia.  Overall I did talk with her about her CT scan last week and that she needs to follow-up with oncology for further imaging and PET scan and repeat CT in the future likely.  She is feeling better and will give her prednisone doxycycline for pneumonia and COPD/reactive airway process.  Roxicodone for breakthrough pain.  Discharged in good condition.  Understands return precautions.  This chart was dictated using voice recognition software.  Despite best efforts to proofread,  errors can occur which can change the documentation meaning.         Final Clinical Impression(s) / ED Diagnoses Final diagnoses:  Community acquired pneumonia, unspecified laterality    Rx / DC Orders ED Discharge Orders          Ordered    oxyCODONE (ROXICODONE) 5 MG immediate release tablet  Every 6 hours PRN        12/03/23 1748    predniSONE (DELTASONE) 20 MG tablet  Daily        12/03/23 1748    doxycycline (VIBRAMYCIN) 100 MG capsule  2 times daily        12/03/23 1748  Virgina Norfolk, DO 12/03/23 1750

## 2023-12-08 NOTE — Progress Notes (Deleted)
 [facility] [phone]   [phone]) [phone]  CONSULT NOTE  REFERRING PHYSICIAN: Brooke Small   REASON FOR CONSULTATION:  Recurrent Lung Cancer   HPI Tracy Park is a 68 y.o. female with a past medical history of CAD, paroxysmal atrial fibrillation, CAP, influenza, COPD, OSA, GERD, hypothyroidism, DDD, hydronephrosis, HLD, septic shock, bipolar disorder, GAD, and *** is referred to the clinic.   In summary, the patient was diagnosed with stage IIIA LUL adenocarcinoma, grade 2 in 11/12/2016. The patient was found to have a 1.4 cm LUL  lesion on CT chest. The patient subsequently underwent a PET scan on 12/13/2016 showing primary lesion. The patient had a CT guided biopsy suspicious for adenocarcinoma. Her staging brain MRI was negative. The patient then underwent left VATs upper lobectomy on 03/05/17. The patient then had left thorascocopy for exploration and mechanical decortication on 03/24/17.   The patient then underwent 4 cycles of adjuvant cisplatin 75 and pemetrexed from 05/02/17-07/11/17. The patient did not receive adjuvant radiation since she was noted to not be a good candidate. The patient was last seen by Dr. Corean Deutscher in 01/27/2019.   For moleculars, PDL1 of 75%. The patient is ROS1 rearrangement, ALK, BRAF, EGFR negative.   The patient was lost to follow up since 01/27/2019. ????  The patient appearss to have several medical problems and has frequent hospital admissions.   The patient recently went to the emergency room on 11/25/2023 for the chief complaint of shortness of breath.  She wears 2 L of supplemental oxygen  at baseline.  She had a CT angiogram performed in the emergency room on 11/25/2023 that did not show any evidence of pulmonary embolus but did show chronic in the left upper lobectomy with cystic changes and scarring in the superior segment of the left lower lobe.  There is also increasing subpleural consolidation in the right upper  lobe abutting the major fissure measuring 2.1 by 0.9 cm and given the patient's history of lung cancer and progressive prior exams they recommended PET scan.  The patient was referred to oncology here at [facility] to establish care.  ***Did patient move? Why did they not go back to [facility]?  Overall, she is feeling *** today. She denies fever, chills, or night sweats. Her appetite and weight ***. Her breathing is ***.  Shortness of breath?  Cough?  Denies any hemoptysis or chest pain.  Denies any nausea, vomiting, diarrhea, or constipation.  Denies any headache or visual changes.  Denies any rashes or skin changes.  Continue smoking?  See pulmonary medicine?  When?  HPI  Past Medical History:  Diagnosis Date   Anginal pain (HCC)    Anxiety    Bipolar disorder (HCC)    Cancer (HCC)    COPD (chronic obstructive pulmonary disease) (HCC)    Dyspnea    Family history of adverse reaction to anesthesia    History of kidney stones    Hydroureteronephrosis 08/16/2021   Hypothyroidism    Lung cancer (HCC)    Myocardial infarction (HCC)    Paroxysmal atrial fibrillation (HCC)    PTSD (post-traumatic stress disorder)    Sleep apnea    Thyroid  disease     Past Surgical History:  Procedure Laterality Date   ABDOMINAL HYSTERECTOMY     BACK SURGERY     CYSTOSCOPY W/ URETERAL STENT PLACEMENT Right 05/03/2021   Procedure: CYSTOSCOPY WITH RETROGRADE PYELOGRAM/URETERAL STENT PLACEMENT;  Surgeon: Andrez Banker, MD;  Location: [facility];  Service: Urology;  Laterality: Right;   EYE SURGERY     kidney stent     thyroidectomy      Family History  Problem Relation Age of Onset   Hypertension Mother    Heart failure Mother    Hypertension Father    Diabetes Father    Heart failure Father     Social History Social History   Tobacco Use   Smoking status: Every Day    Current packs/day: 0.15    Types: Cigarettes   Smokeless tobacco: Never  Vaping Use   Vaping status: Never Used  Substance  Use Topics   Alcohol  use: Not Currently   Drug use: Not Currently    Allergies  Allergen Reactions   Red Dye #40 (Allura Red) Hives, Itching and Other (See Comments)    Red food dye   Strawberry Extract Hives and Itching   Tomato Hives and Itching   Aspirin Hives   Tape Rash and Other (See Comments)    Prefers paper tape   Wound Dressing Adhesive Rash    Current Outpatient Medications  Medication Sig Dispense Refill   albuterol  (PROVENTIL ) (2.5 MG/3ML) 0.083% nebulizer solution Take 3 mLs (2.5 mg total) by nebulization every 4 (four) hours while awake for 3 days, THEN 3 mLs (2.5 mg total) every 4 (four) hours as needed for wheezing or shortness of breath. (Patient taking differently: Inhale 3 mLs (2.5 mg total) every 4 (four) hours as needed for wheezing or shortness of breath.) 90 mL 0   albuterol  (VENTOLIN  HFA) 108 (90 Base) MCG/ACT inhaler Inhale 2 puffs into the lungs every 4 (four) hours as needed for wheezing or shortness of breath. 6.7 g 0   albuterol  (VENTOLIN  HFA) 108 (90 Base) MCG/ACT inhaler Inhale 2 puffs into the lungs every 6 (six) hours as needed for wheezing or shortness of breath. 6.7 g 0   amoxicillin -clavulanate (AUGMENTIN ) 875-125 MG tablet Take 1 tablet by mouth every 12 (twelve) hours. 14 tablet 0   apixaban  (ELIQUIS ) 5 MG TABS tablet Take 1 tablet (5 mg total) by mouth 2 (two) times daily. 60 tablet 1   Budeson-Glycopyrrol-Formoterol  (BREZTRI  AEROSPHERE) 160-9-4.8 MCG/ACT AERO Inhale 1 puff into the lungs in the morning and at bedtime.     doxycycline  (VIBRAMYCIN ) 100 MG capsule Take 1 capsule (100 mg total) by mouth 2 (two) times daily. 20 capsule 0   ipratropium-albuterol  (DUONEB) 0.5-2.5 (3) MG/3ML SOLN Take 3 mLs by nebulization every 6 (six) hours as needed. 360 mL 0   levothyroxine  (SYNTHROID ) 125 MCG tablet Take 125 mcg by mouth daily before breakfast.     losartan  (COZAAR ) 25 MG tablet Take 12.5 mg by mouth daily.     magnesium  oxide (MAG-OX) 400 (240 Mg)  MG tablet Take 400 mg by mouth in the morning.     metoprolol  succinate (TOPROL -XL) 25 MG 24 hr tablet Take 0.5 tablets (12.5 mg total) by mouth daily. 15 tablet 0   nitroGLYCERIN  (NITROSTAT ) 0.4 MG SL tablet Place 0.4 mg under the tongue every 5 (five) minutes as needed for chest pain.     oxyCODONE  (ROXICODONE ) 5 MG immediate release tablet Take 1 tablet (5 mg total) by mouth every 6 (six) hours as needed for severe pain (pain score 7-10). 10 tablet 0   OXYGEN  Inhale 2 L/min into the lungs continuous.     rosuvastatin  (CRESTOR ) 20 MG tablet Take 20 mg by mouth daily.     sertraline  (ZOLOFT ) 25 MG tablet Take 25 mg  by mouth in the morning.     SYSTANE COMPLETE PF 0.6 % SOLN Place 1 drop into both eyes 4 (four) times daily.     TYLENOL  8 HOUR 650 MG CR tablet Take 650-1,300 mg by mouth 2 (two) times daily as needed for pain.     No current facility-administered medications for this visit.    REVIEW OF SYSTEMS:   Review of Systems  Constitutional: Negative for appetite change, chills, fatigue, fever and unexpected weight change.  HENT:   Negative for mouth sores, nosebleeds, sore throat and trouble swallowing.   Eyes: Negative for eye problems and icterus.  Respiratory: Negative for cough, hemoptysis, shortness of breath and wheezing.   Cardiovascular: Negative for chest pain and leg swelling.  Gastrointestinal: Negative for abdominal pain, constipation, diarrhea, nausea and vomiting.  Genitourinary: Negative for bladder incontinence, difficulty urinating, dysuria, frequency and hematuria.   Musculoskeletal: Negative for back pain, gait problem, neck pain and neck stiffness.  Skin: Negative for itching and rash.  Neurological: Negative for dizziness, extremity weakness, gait problem, headaches, light-headedness and seizures.  Hematological: Negative for adenopathy. Does not bruise/bleed easily.  Psychiatric/Behavioral: Negative for confusion, depression and sleep disturbance. The patient is  not nervous/anxious.     PHYSICAL EXAMINATION:  There were no vitals taken for this visit.  ECOG PERFORMANCE STATUS: {CHL ONC ECOG [id number]  Physical Exam  Constitutional: Oriented to person, place, and time and well-developed, well-nourished, and in no distress. No distress.  HENT:  Head: Normocephalic and atraumatic.  Mouth/Throat: Oropharynx is clear and moist. No oropharyngeal exudate.  Eyes: Conjunctivae are normal. Right eye exhibits no discharge. Left eye exhibits no discharge. No scleral icterus.  Neck: Normal range of motion. Neck supple.  Cardiovascular: Normal rate, regular rhythm, normal heart sounds and intact distal pulses.   Pulmonary/Chest: Effort normal and breath sounds normal. No respiratory distress. No wheezes. No rales.  Abdominal: Soft. Bowel sounds are normal. Exhibits no distension and no mass. There is no tenderness.  Musculoskeletal: Normal range of motion. Exhibits no edema.  Lymphadenopathy:    No cervical adenopathy.  Neurological: Alert and oriented to person, place, and time. Exhibits normal muscle tone. Gait normal. Coordination normal.  Skin: Skin is warm and dry. No rash noted. Not diaphoretic. No erythema. No pallor.  Psychiatric: Mood, memory and judgment normal.  Vitals reviewed.  LABORATORY DATA: Lab Results  Component Value Date   WBC 8.8 12/03/2023   HGB 12.6 12/03/2023   HCT 42.2 12/03/2023   MCV 96.8 12/03/2023   PLT 259 12/03/2023      Chemistry      Component Value Date/Time   NA 142 12/03/2023 1525   K 3.5 12/03/2023 1525   CL 98 12/03/2023 1525   CO2 35 (H) 12/03/2023 1525   BUN 15 12/03/2023 1525   CREATININE 0.53 12/03/2023 1525      Component Value Date/Time   CALCIUM  9.3 12/03/2023 1525   ALKPHOS 70 12/03/2023 1525   AST 15 12/03/2023 1525   ALT 11 12/03/2023 1525   BILITOT 0.2 12/03/2023 1525       RADIOGRAPHIC STUDIES: DG Chest 2 View Result Date: 12/03/2023 CLINICAL DATA:  Shortness of breath EXAM:  CHEST - 2 VIEW COMPARISON:  11/25/2023 chest x-ray and CT, 06/24/2023 FINDINGS: Chronic postsurgical changes and pleuroparenchymal scarring at left apex. Vague right upper lobe pulmonary opacity. Stable cardiomediastinal silhouette. No pneumothorax IMPRESSION: 1. Vague right upper lobe pulmonary opacity, possible pneumonia. Imaging follow-up to resolution is recommended 2. Chronic postsurgical  changes and pleuroparenchymal scarring at left apex. Electronically Signed   By: Esmeralda Hedge M.D.   On: 12/03/2023 16:54   CT Angio Chest PE W and/or Wo Contrast Result Date: 11/25/2023 CLINICAL DATA:  Short of breath, left chest pain, history of lung cancer EXAM: CT ANGIOGRAPHY CHEST WITH CONTRAST TECHNIQUE: Multidetector CT imaging of the chest was performed using the standard protocol during bolus administration of intravenous contrast. Multiplanar CT image reconstructions and MIPs were obtained to evaluate the vascular anatomy. RADIATION DOSE REDUCTION: This exam was performed according to the departmental dose-optimization program which includes automated exposure control, adjustment of the mA and/or kV according to patient size and/or use of iterative reconstruction technique. CONTRAST:  75mL OMNIPAQUE  IOHEXOL  350 MG/ML SOLN COMPARISON:  11/25/2023, 08/03/2023, 03/30/2022 FINDINGS: Cardiovascular: This is a technically adequate evaluation of the pulmonary vasculature. No filling defects or pulmonary emboli. The heart is unremarkable without pericardial effusion. No evidence of thoracic aortic aneurysm or dissection. Atherosclerosis of the aorta and coronary vasculature. Mediastinum/Nodes: No enlarged mediastinal, hilar, or axillary lymph nodes. Thyroid  gland, trachea, and esophagus demonstrate no significant findings. Lungs/Pleura: Prior left upper lobectomy with associated left-sided volume loss. Bullous changes and chronic scarring are again seen within the superior segment of the left lower lobe. Stable  emphysema. There is increasing subpleural consolidation within the right upper lobe abutting the major fissure, measuring 2.1 x 0.9 cm reference image 34/4. No acute airspace disease, effusion, or pneumothorax. Central airways are patent. Upper Abdomen: No acute abnormality. Musculoskeletal: No acute or destructive bony abnormalities. Chronic mild compression deformity superior endplate of the T11 vertebral body. Reconstructed images demonstrate no additional findings. Review of the MIP images confirms the above findings. IMPRESSION: 1. No evidence of pulmonary embolus. 2. Chronic postsurgical changes from left upper lobectomy, with cystic changes and scarring within the superior segment of the left lower lobe. 3. Increasing subpleural consolidation within the right upper lobe abutting the major fissure, measuring up to 2.1 x 0.9 cm. Given the patient's history of lung cancer and the progression since prior exams, further evaluation with PET scan may be useful. 4. Aortic Atherosclerosis (ICD10-I70.0) and Emphysema (ICD10-J43.9). Electronically Signed   By: Bobbye Burrow M.D.   On: 11/25/2023 22:39   DG Chest 2 View Result Date: 11/25/2023 CLINICAL DATA:  Shortness of breath.  History of lung cancer. EXAM: CHEST - 2 VIEW COMPARISON:  November 01, 2023. FINDINGS: The heart size and mediastinal contours are within normal limits. Stable scarring seen in left upper lobe consistent with surgery. Emphysematous disease is again noted. No definite acute abnormality seen. The visualized skeletal structures are unremarkable. IMPRESSION: Chronic findings as noted above. No definite acute abnormality noted. Electronically Signed   By: Rosalene Colon M.D.   On: 11/25/2023 20:01    ASSESSMENT: This is a very pleasant 68 year old female referred to the clinic for possible recurrent lung cancer.  The patient was diagnosed with stage III non-small cell lung cancer, adenocarcinoma in 2018 and is status post resection followed  by 4 cycles of adjuvant chemotherapy at [facility].  The patient's PD-L1 expression was***the patient was negative for any actionable mutations.  The patient recently had a CT angiogram performed in the emergency room which showed chronic postsurgical changes of the left upper lobectomy with cystic changes and scarring within the superior segment of the left lower lobe.  There was also some increasing subpleural consolidation within the right upper lobe abutting the major fissure measuring 2.1 x 0.9 cm and given the  patient's history of lung cancer and progression since the last imaging study, radiology recommended PET scan.  The patient was seen with Dr. Marguerita Shih today.  Dr. Marguerita Shih recommends PET scan and referral to pulmonary medicine to consider bronchoscopy and biopsy.  Brain MRI?  We will see the patient back for follow-up visit in 3 weeks for evaluation and for more detailed discussion about her current condition and possible treatment recommendations at that time based on further workup.  The patient voices understanding of current disease status and treatment options and is in agreement with the current care plan.  All questions were answered. The patient knows to call the clinic with any problems, questions or concerns. We can certainly see the patient much sooner if necessary.  Thank you so much for allowing me to participate in the care of Tracy Park. I will continue to follow up the patient with you and assist in her care.  I spent {CHL ONC TIME VISIT - ZOXWR:[id number]} counseling the patient face to face. The total time spent in the appointment was {CHL ONC TIME VISIT - BJYNW:[id number]}.  Disclaimer: This note was dictated with voice recognition software. Similar sounding words can inadvertently be transcribed and may not be corrected upon review.   Tracy Park December 08, 2023, 1:15 PM

## 2023-12-09 ENCOUNTER — Encounter (HOSPITAL_COMMUNITY): Payer: Self-pay

## 2023-12-09 ENCOUNTER — Emergency Department (HOSPITAL_COMMUNITY)
Admission: EM | Admit: 2023-12-09 | Discharge: 2023-12-09 | Disposition: A | Discharge status: Home / Self Care | Attending: Emergency Medicine | Admitting: Emergency Medicine

## 2023-12-09 ENCOUNTER — Other Ambulatory Visit: Payer: Self-pay

## 2023-12-09 ENCOUNTER — Other Ambulatory Visit: Payer: Self-pay | Admitting: Physician Assistant

## 2023-12-09 ENCOUNTER — Emergency Department (HOSPITAL_COMMUNITY)

## 2023-12-09 DIAGNOSIS — J441 Chronic obstructive pulmonary disease with (acute) exacerbation: Secondary | ICD-10-CM | POA: Insufficient documentation

## 2023-12-09 DIAGNOSIS — C3490 Malignant neoplasm of unspecified part of unspecified bronchus or lung: Secondary | ICD-10-CM

## 2023-12-09 DIAGNOSIS — R0602 Shortness of breath: Secondary | ICD-10-CM | POA: Diagnosis present

## 2023-12-09 LAB — BASIC METABOLIC PANEL WITH GFR
Anion gap: 8 (ref 5–15)
BUN: 14 mg/dL (ref 8–23)
CO2: 31 mmol/L (ref 22–32)
Calcium: 8.4 mg/dL — ABNORMAL LOW (ref 8.9–10.3)
Chloride: 99 mmol/L (ref 98–111)
Creatinine, Ser: 0.47 mg/dL (ref 0.44–1.00)
GFR, Estimated: >60 mL/min (ref >60)
Glucose, Bld: 145 mg/dL — ABNORMAL HIGH (ref 70–99)
Potassium: 3.6 mmol/L (ref 3.5–5.1)
Sodium: 138 mmol/L (ref 135–145)

## 2023-12-09 LAB — CBC WITH DIFFERENTIAL/PLATELET
Abs Immature Granulocytes: 0.11 10*3/uL — ABNORMAL HIGH (ref 0.00–0.07)
Basophils Absolute: 0 10*3/uL (ref 0.0–0.1)
Basophils Relative: 0 %
Eosinophils Absolute: 0 10*3/uL (ref 0.0–0.5)
Eosinophils Relative: 0 %
HCT: 36.2 % (ref 36.0–46.0)
Hemoglobin: 10.9 g/dL — ABNORMAL LOW (ref 12.0–15.0)
Immature Granulocytes: 1 %
Lymphocytes Relative: 14 %
Lymphs Abs: 1.3 10*3/uL (ref 0.7–4.0)
MCH: 29.1 pg (ref 26.0–34.0)
MCHC: 30.1 g/dL (ref 30.0–36.0)
MCV: 96.8 fL (ref 80.0–100.0)
Monocytes Absolute: 0.4 10*3/uL (ref 0.1–1.0)
Monocytes Relative: 4 %
Neutro Abs: 7.8 10*3/uL — ABNORMAL HIGH (ref 1.7–7.7)
Neutrophils Relative %: 81 %
Platelets: 227 10*3/uL (ref 150–400)
RBC: 3.74 MIL/uL — ABNORMAL LOW (ref 3.87–5.11)
RDW: 13.9 % (ref 11.5–15.5)
WBC: 9.7 10*3/uL (ref 4.0–10.5)
nRBC: 0 % (ref 0.0–0.2)

## 2023-12-09 LAB — TROPONIN I (HIGH SENSITIVITY)
Troponin I (High Sensitivity): 4 ng/L (ref <18)
Troponin I (High Sensitivity): 6 ng/L (ref <18)

## 2023-12-09 LAB — BRAIN NATRIURETIC PEPTIDE: B Natriuretic Peptide: 107.2 pg/mL — ABNORMAL HIGH (ref 0.0–100.0)

## 2023-12-09 MED ORDER — IPRATROPIUM-ALBUTEROL 0.5-2.5 (3) MG/3ML IN SOLN
3.0000 mL | Freq: Once | RESPIRATORY_TRACT | Status: AC
  Administered 2023-12-09: 3 mL via RESPIRATORY_TRACT
  Filled 2023-12-09: qty 3

## 2023-12-09 MED ORDER — PREDNISONE 20 MG PO TABS
60.0000 mg | ORAL_TABLET | Freq: Once | ORAL | Status: AC
  Administered 2023-12-09: 60 mg via ORAL
  Filled 2023-12-09: qty 3

## 2023-12-09 MED ORDER — PREDNISONE 20 MG PO TABS: ORAL_TABLET | ORAL | 0 refills | Status: DC

## 2023-12-09 NOTE — ED Provider Notes (Signed)
 [place] EMERGENCY DEPARTMENT AT [facility] Provider Note   CSN: [id number] Arrival date & time: 12/09/23  1113     History {Add pertinent medical, surgical, social history, OB history to HPI:1} Chief Complaint  Patient presents with  . Shortness of Breath    Tracy Park is a 68 y.o. female.  HPI Doesn't feel any improvement since taking doxcycline and finished prednisone. Still smoking but cut back a lot, down to 3-4 per day. CP persisant contant, right lung feels heavy. No blood in sputum, sometimes dark with "particles". No lower extremity swelling or calf pain. Appointment tomorrow with oncology and getting set up for CT PET.    Home Medications Prior to Admission medications   Medication Sig Start Date End Date Taking? Authorizing Provider  albuterol (PROVENTIL) (2.5 MG/3ML) 0.083% nebulizer solution Take 3 mLs (2.5 mg total) by nebulization every 4 (four) hours while awake for 3 days, THEN 3 mLs (2.5 mg total) every 4 (four) hours as needed for wheezing or shortness of breath. Patient taking differently: Inhale 3 mLs (2.5 mg total) every 4 (four) hours as needed for wheezing or shortness of breath. 10/18/23 11/20/23  Charlynne Pander, MD  albuterol (VENTOLIN HFA) 108 (90 Base) MCG/ACT inhaler Inhale 2 puffs into the lungs every 4 (four) hours as needed for wheezing or shortness of breath. 10/18/23   Charlynne Pander, MD  albuterol (VENTOLIN HFA) 108 (90 Base) MCG/ACT inhaler Inhale 2 puffs into the lungs every 6 (six) hours as needed for wheezing or shortness of breath. 11/07/23   Amin, Ankit C, MD  amoxicillin-clavulanate (AUGMENTIN) 875-125 MG tablet Take 1 tablet by mouth every 12 (twelve) hours. 11/25/23   Small, Brooke L, PA  apixaban (ELIQUIS) 5 MG TABS tablet Take 1 tablet (5 mg total) by mouth 2 (two) times daily. 06/06/23   Jonah Blue, MD  Budeson-Glycopyrrol-Formoterol (BREZTRI AEROSPHERE) 160-9-4.8 MCG/ACT AERO Inhale 1 puff into the lungs in the  morning and at bedtime.    [provider]  doxycycline (VIBRAMYCIN) 100 MG capsule Take 1 capsule (100 mg total) by mouth 2 (two) times daily. 12/03/23   Curatolo, Adam, DO  ipratropium-albuterol (DUONEB) 0.5-2.5 (3) MG/3ML SOLN Take 3 mLs by nebulization every 6 (six) hours as needed. 10/21/23   Small, Brooke L, PA  levothyroxine (SYNTHROID) 125 MCG tablet Take 125 mcg by mouth daily before breakfast.    [provider]  losartan (COZAAR) 25 MG tablet Take 12.5 mg by mouth daily. 08/29/23   [provider]  magnesium oxide (MAG-OX) 400 (240 Mg) MG tablet Take 400 mg by mouth in the morning.    [provider]  metoprolol succinate (TOPROL-XL) 25 MG 24 hr tablet Take 0.5 tablets (12.5 mg total) by mouth daily. 06/22/23 11/02/23  Narda Bonds, MD  nitroGLYCERIN (NITROSTAT) 0.4 MG SL tablet Place 0.4 mg under the tongue every 5 (five) minutes as needed for chest pain.    [provider]  oxyCODONE (ROXICODONE) 5 MG immediate release tablet Take 1 tablet (5 mg total) by mouth every 6 (six) hours as needed for severe pain (pain score 7-10). 12/03/23   Curatolo, Adam, DO  OXYGEN Inhale 2 L/min into the lungs continuous.    [provider]  rosuvastatin (CRESTOR) 20 MG tablet Take 20 mg by mouth daily.    [provider]  sertraline (ZOLOFT) 25 MG tablet Take 25 mg by mouth in the morning.    [provider]  SYSTANE COMPLETE  PF 0.6 % SOLN Place 1 drop into both eyes 4 (four) times daily.    [provider]  TYLENOL 8 HOUR 650 MG CR tablet Take 650-1,300 mg by mouth 2 (two) times daily as needed for pain.    [provider]      Allergies    Red dye #40 (allura red), Strawberry extract, Tomato, Aspirin, Tape, and Wound dressing adhesive    Review of Systems   Review of Systems  Physical Exam Updated Vital Signs BP 130/74   Pulse 81   Temp 97.8 F (36.6 C)   Resp (!) 23   SpO2 99%  Physical  Exam Constitutional:      Appearance: Normal appearance.  HENT:     Mouth/Throat:     Pharynx: Oropharynx is clear.  Cardiovascular:     Rate and Rhythm: Normal rate and regular rhythm.  Pulmonary:     Comments: Crackle left lung base. BS soft throughout. Occasional wheeze. Mild increased WOB at rest Musculoskeletal:        General: No swelling or tenderness.     Right lower leg: No edema.     Left lower leg: No edema.  Skin:    General: Skin is warm and dry.  Neurological:     General: No focal deficit present.     Mental Status: She is alert and oriented to person, place, and time.     Coordination: Coordination normal.  Psychiatric:        Mood and Affect: Mood normal.    ED Results / Procedures / Treatments   Labs (all labs ordered are listed, but only abnormal results are displayed) Labs Reviewed  BASIC METABOLIC PANEL WITH GFR  BRAIN NATRIURETIC PEPTIDE  CBC WITH DIFFERENTIAL/PLATELET  TROPONIN I (HIGH SENSITIVITY)    EKG None  Radiology No results found.  Procedures Procedures  {Document cardiac monitor, telemetry assessment procedure when appropriate:1}  Medications Ordered in ED Medications  ipratropium-albuterol (DUONEB) 0.5-2.5 (3) MG/3ML nebulizer solution 3 mL (3 mLs Nebulization Given 12/09/23 1211)  predniSONE (DELTASONE) tablet 60 mg (60 mg Oral Given 12/09/23 1210)    ED Course/ Medical Decision Making/ A&P   {   Click here for ABCD2, HEART and other calculatorsREFRESH Note before signing :1}                              Medical Decision Making Amount and/or Complexity of Data Reviewed Labs: ordered. Radiology: ordered.  Risk Prescription drug management.   ***  {Document critical care time when appropriate:1} {Document review of labs and clinical decision tools ie heart score, Chads2Vasc2 etc:1}  {Document your independent review of radiology images, and any outside records:1} {Document your discussion with family members, caretakers,  and with consultants:1} {Document social determinants of health affecting pt's care:1} {Document your decision making why or why not admission, treatments were needed:1} Final Clinical Impression(s) / ED Diagnoses Final diagnoses:  None    Rx / DC Orders ED Discharge Orders     None

## 2023-12-09 NOTE — ED Triage Notes (Signed)
 EMS reports from home, Hx of stage 3 lung CA, CHF and COPD, called out for worsening short of breath and chest tightness past 2 weeks. Pt states home albuterol ineffective. Normally on 2ltrs O2 24-7.  BP 130/90 HR 96 RR 24 Sp02 100 (duoneb)  20ga R forearm  Duoneb enroute  125mg  Solumedrol

## 2023-12-09 NOTE — Progress Notes (Signed)
 I call the pt this morning to introduce myself and confirm her appt with [place] tomorrow 4/2. Pt states she plans on being here, however she woke up early this morning having trouble breathing. She put on her oxygen and she states it has helped her some, but she is still short of breath. I noted that the pt has recently had several trips to the ED for respiratory issues. I suggested that the pt report to a nearby UC or ED for work up if she is having trouble breathing. Pt states she is going to give herself a breathing treatment and if she feels like she needs to go to the ER, she will call medical transport.

## 2023-12-09 NOTE — Discharge Instructions (Signed)
 1.  Part of your symptoms are likely due to your COPD.  You have been given a 60 mg dose of prednisone in the emergency department.  You are being prescribed a taper.  Use your albuterol inhaler every 2-4 hours for the next 24 to 48 hours as needed. 2.  You have follow-up appointments with oncology and further CT pet imaging to look for any recurrence of your lung cancer.  Do not feel to go to these appointments.  Is extremely important you continue to work closely with your pulmonologist, oncologist and primary care doctor to monitor your lung status.

## 2023-12-10 ENCOUNTER — Other Ambulatory Visit: Payer: Self-pay

## 2023-12-10 ENCOUNTER — Inpatient Hospital Stay
Admission: RE | Admit: 2023-12-10 | Discharge: 2023-12-10 | Disposition: A | Discharge status: Home / Self Care | Payer: Self-pay | Source: Ambulatory Visit | Attending: Physician Assistant | Admitting: Physician Assistant

## 2023-12-10 ENCOUNTER — Ambulatory Visit: Admitting: Physician Assistant

## 2023-12-10 ENCOUNTER — Inpatient Hospital Stay
Admission: RE | Admit: 2023-12-10 | Discharge: 2023-12-10 | Disposition: A | Discharge status: Home / Self Care | Payer: Self-pay | Source: Ambulatory Visit | Attending: Physician Assistant

## 2023-12-10 ENCOUNTER — Other Ambulatory Visit

## 2023-12-10 DIAGNOSIS — C3490 Malignant neoplasm of unspecified part of unspecified bronchus or lung: Secondary | ICD-10-CM

## 2023-12-10 NOTE — Progress Notes (Unsigned)
 Pt no-showed for her appt with C.Heilingoetter, Onc PA. I reached out to the pt and her daughter. Both calls went to VM.

## 2023-12-12 ENCOUNTER — Emergency Department (HOSPITAL_COMMUNITY)

## 2023-12-12 ENCOUNTER — Inpatient Hospital Stay (HOSPITAL_COMMUNITY)
Admission: EM | Admit: 2023-12-12 | Discharge: 2023-12-19 | DRG: 189 | Disposition: A | Discharge status: Home / Self Care | Attending: Family Medicine | Admitting: Family Medicine

## 2023-12-12 ENCOUNTER — Other Ambulatory Visit: Payer: Self-pay

## 2023-12-12 ENCOUNTER — Encounter (HOSPITAL_COMMUNITY): Payer: Self-pay | Admitting: Emergency Medicine

## 2023-12-12 DIAGNOSIS — Z7901 Long term (current) use of anticoagulants: Secondary | ICD-10-CM

## 2023-12-12 DIAGNOSIS — Z79899 Other long term (current) drug therapy: Secondary | ICD-10-CM

## 2023-12-12 DIAGNOSIS — C3490 Malignant neoplasm of unspecified part of unspecified bronchus or lung: Secondary | ICD-10-CM | POA: Diagnosis present

## 2023-12-12 DIAGNOSIS — F4310 Post-traumatic stress disorder, unspecified: Secondary | ICD-10-CM | POA: Diagnosis present

## 2023-12-12 DIAGNOSIS — Z833 Family history of diabetes mellitus: Secondary | ICD-10-CM

## 2023-12-12 DIAGNOSIS — I10 Essential (primary) hypertension: Secondary | ICD-10-CM | POA: Diagnosis present

## 2023-12-12 DIAGNOSIS — J441 Chronic obstructive pulmonary disease with (acute) exacerbation: Principal | ICD-10-CM | POA: Diagnosis present

## 2023-12-12 DIAGNOSIS — Z803 Family history of malignant neoplasm of breast: Secondary | ICD-10-CM

## 2023-12-12 DIAGNOSIS — Z66 Do not resuscitate: Secondary | ICD-10-CM | POA: Diagnosis present

## 2023-12-12 DIAGNOSIS — R636 Underweight: Secondary | ICD-10-CM | POA: Diagnosis present

## 2023-12-12 DIAGNOSIS — Z85118 Personal history of other malignant neoplasm of bronchus and lung: Secondary | ICD-10-CM

## 2023-12-12 DIAGNOSIS — H5461 Unqualified visual loss, right eye, normal vision left eye: Secondary | ICD-10-CM | POA: Diagnosis present

## 2023-12-12 DIAGNOSIS — J9621 Acute and chronic respiratory failure with hypoxia: Principal | ICD-10-CM

## 2023-12-12 DIAGNOSIS — Z5982 Transportation insecurity: Secondary | ICD-10-CM

## 2023-12-12 DIAGNOSIS — F319 Bipolar disorder, unspecified: Secondary | ICD-10-CM | POA: Diagnosis present

## 2023-12-12 DIAGNOSIS — J9611 Chronic respiratory failure with hypoxia: Secondary | ICD-10-CM

## 2023-12-12 DIAGNOSIS — Z9221 Personal history of antineoplastic chemotherapy: Secondary | ICD-10-CM

## 2023-12-12 DIAGNOSIS — Z800 Family history of malignant neoplasm of digestive organs: Secondary | ICD-10-CM

## 2023-12-12 DIAGNOSIS — R0602 Shortness of breath: Secondary | ICD-10-CM | POA: Diagnosis not present

## 2023-12-12 DIAGNOSIS — E785 Hyperlipidemia, unspecified: Secondary | ICD-10-CM | POA: Diagnosis present

## 2023-12-12 DIAGNOSIS — E039 Hypothyroidism, unspecified: Secondary | ICD-10-CM | POA: Diagnosis present

## 2023-12-12 DIAGNOSIS — I480 Paroxysmal atrial fibrillation: Secondary | ICD-10-CM | POA: Diagnosis present

## 2023-12-12 DIAGNOSIS — I252 Old myocardial infarction: Secondary | ICD-10-CM

## 2023-12-12 DIAGNOSIS — Z1152 Encounter for screening for COVID-19: Secondary | ICD-10-CM

## 2023-12-12 DIAGNOSIS — F17210 Nicotine dependence, cigarettes, uncomplicated: Secondary | ICD-10-CM | POA: Diagnosis present

## 2023-12-12 DIAGNOSIS — E119 Type 2 diabetes mellitus without complications: Secondary | ICD-10-CM | POA: Diagnosis present

## 2023-12-12 DIAGNOSIS — G4733 Obstructive sleep apnea (adult) (pediatric): Secondary | ICD-10-CM | POA: Diagnosis present

## 2023-12-12 DIAGNOSIS — Z5986 Financial insecurity: Secondary | ICD-10-CM

## 2023-12-12 DIAGNOSIS — Z681 Body mass index (BMI) 19 or less, adult: Secondary | ICD-10-CM

## 2023-12-12 DIAGNOSIS — Z79890 Hormone replacement therapy (postmenopausal): Secondary | ICD-10-CM

## 2023-12-12 DIAGNOSIS — R0600 Dyspnea, unspecified: Secondary | ICD-10-CM

## 2023-12-12 DIAGNOSIS — Z8249 Family history of ischemic heart disease and other diseases of the circulatory system: Secondary | ICD-10-CM

## 2023-12-12 DIAGNOSIS — Z716 Tobacco abuse counseling: Secondary | ICD-10-CM

## 2023-12-12 LAB — BASIC METABOLIC PANEL WITH GFR
Anion gap: 9 (ref 5–15)
BUN: 11 mg/dL (ref 8–23)
CO2: 30 mmol/L (ref 22–32)
Calcium: 8 mg/dL — ABNORMAL LOW (ref 8.9–10.3)
Chloride: 103 mmol/L (ref 98–111)
Creatinine, Ser: 0.47 mg/dL (ref 0.44–1.00)
GFR, Estimated: >60 mL/min (ref >60)
Glucose, Bld: 114 mg/dL — ABNORMAL HIGH (ref 70–99)
Potassium: 3.2 mmol/L — ABNORMAL LOW (ref 3.5–5.1)
Sodium: 142 mmol/L (ref 135–145)

## 2023-12-12 LAB — CBC
HCT: 41.1 % (ref 36.0–46.0)
Hemoglobin: 12.7 g/dL (ref 12.0–15.0)
MCH: 29.3 pg (ref 26.0–34.0)
MCHC: 30.9 g/dL (ref 30.0–36.0)
MCV: 94.7 fL (ref 80.0–100.0)
Platelets: 259 10*3/uL (ref 150–400)
RBC: 4.34 MIL/uL (ref 3.87–5.11)
RDW: 13.9 % (ref 11.5–15.5)
WBC: 12.8 10*3/uL — ABNORMAL HIGH (ref 4.0–10.5)
nRBC: 0 % (ref 0.0–0.2)

## 2023-12-12 LAB — TROPONIN I (HIGH SENSITIVITY): Troponin I (High Sensitivity): 6 ng/L (ref <18)

## 2023-12-12 LAB — SARS CORONAVIRUS 2 BY RT PCR: SARS Coronavirus 2 by RT PCR: NEGATIVE

## 2023-12-12 MED ORDER — ALBUTEROL SULFATE (2.5 MG/3ML) 0.083% IN NEBU
2.5000 mg | INHALATION_SOLUTION | Freq: Once | RESPIRATORY_TRACT | Status: AC
  Administered 2023-12-12: 2.5 mg via RESPIRATORY_TRACT
  Filled 2023-12-12: qty 3

## 2023-12-12 MED ORDER — IPRATROPIUM-ALBUTEROL 0.5-2.5 (3) MG/3ML IN SOLN
3.0000 mL | Freq: Once | RESPIRATORY_TRACT | Status: AC
  Administered 2023-12-12: 3 mL via RESPIRATORY_TRACT
  Filled 2023-12-12: qty 3

## 2023-12-12 NOTE — ED Provider Notes (Incomplete)
 [name] Provider Note   CSN: [id number] Arrival date & time: 12/12/23  2215     History {Add pertinent medical, surgical, social history, OB history to HPI:1} Chief Complaint  Patient presents with  . Shortness of Breath    Tracy Park is a 68 y.o. female.   Shortness of Breath   68 year old female presents emergency department with some shortness of breath.  Reports increased shortness of breath yesterday with persistent since then today prompting visit to emergency department.  Patient does state that she feels like her "lungs are tight".  Was given Solu-Medrol, magnesium, DuoNeb with some improvement of symptoms.  Patient states that she has been using albuterol breathing treatments at home without significant improvement.  Denies any fevers, chills, abdominal pain, nausea, vomiting.  Does report increased cough along with her symptoms as well.  Patient states that it feels like her COPD is flaring up.  Past medical history significant for lung cancer with left upper lobectomy 2018 with chemotherapy completion 2019, paroxysmal atrial fibrillation on Eliquis, chronic respiratory failure with hypoxia on 2 L, degenerative disc disease, COPD, OSA, GAD  Home Medications Prior to Admission medications   Medication Sig Start Date End Date Taking? Authorizing Provider  albuterol (PROVENTIL) (2.5 MG/3ML) 0.083% nebulizer solution Take 3 mLs (2.5 mg total) by nebulization every 4 (four) hours while awake for 3 days, THEN 3 mLs (2.5 mg total) every 4 (four) hours as needed for wheezing or shortness of breath. Patient taking differently: Inhale 3 mLs (2.5 mg total) every 4 (four) hours as needed for wheezing or shortness of breath. 10/18/23 11/20/23  Charlynne Pander, MD  albuterol (VENTOLIN HFA) 108 (90 Base) MCG/ACT inhaler Inhale 2 puffs into the lungs every 4 (four) hours as needed for wheezing or shortness of breath. 10/18/23   Charlynne Pander, MD  albuterol (VENTOLIN HFA) 108 (90 Base) MCG/ACT inhaler Inhale 2 puffs into the lungs every 6 (six) hours as needed for wheezing or shortness of breath. 11/07/23   Amin, Ankit C, MD  amoxicillin-clavulanate (AUGMENTIN) 875-125 MG tablet Take 1 tablet by mouth every 12 (twelve) hours. 11/25/23   Small, Brooke L, PA  apixaban (ELIQUIS) 5 MG TABS tablet Take 1 tablet (5 mg total) by mouth 2 (two) times daily. 06/06/23   Jonah Blue, MD  Budeson-Glycopyrrol-Formoterol (BREZTRI AEROSPHERE) 160-9-4.8 MCG/ACT AERO Inhale 1 puff into the lungs in the morning and at bedtime.    [provider]  doxycycline (VIBRAMYCIN) 100 MG capsule Take 1 capsule (100 mg total) by mouth 2 (two) times daily. 12/03/23   Curatolo, Adam, DO  ipratropium-albuterol (DUONEB) 0.5-2.5 (3) MG/3ML SOLN Take 3 mLs by nebulization every 6 (six) hours as needed. 10/21/23   Small, Brooke L, PA  levothyroxine (SYNTHROID) 125 MCG tablet Take 125 mcg by mouth daily before breakfast.    [provider]  losartan (COZAAR) 25 MG tablet Take 12.5 mg by mouth daily. 08/29/23   [provider]  magnesium oxide (MAG-OX) 400 (240 Mg) MG tablet Take 400 mg by mouth in the morning.    [provider]  metoprolol succinate (TOPROL-XL) 25 MG 24 hr tablet Take 0.5 tablets (12.5 mg total) by mouth daily. 06/22/23 11/02/23  Narda Bonds, MD  nitroGLYCERIN (NITROSTAT) 0.4 MG SL tablet Place 0.4 mg under the tongue every 5 (five) minutes as needed for chest pain.    [provider]  oxyCODONE (ROXICODONE) 5 MG immediate release tablet  Take 1 tablet (5 mg total) by mouth every 6 (six) hours as needed for severe pain (pain score 7-10). 12/03/23   Curatolo, Adam, DO  OXYGEN Inhale 2 L/min into the lungs continuous.    [provider]  predniSONE (DELTASONE) 20 MG tablet 2 tabs x 3 days, then 1.5 tabs x 3 days, then 1 tab x 3 days, then 0.5 tabs x 3 days 12/09/23   Arby Barrette, MD  rosuvastatin  (CRESTOR) 20 MG tablet Take 20 mg by mouth daily.    [provider]  sertraline (ZOLOFT) 25 MG tablet Take 25 mg by mouth in the morning.    [provider]  SYSTANE COMPLETE PF 0.6 % SOLN Place 1 drop into both eyes 4 (four) times daily.    [provider]  TYLENOL 8 HOUR 650 MG CR tablet Take 650-1,300 mg by mouth 2 (two) times daily as needed for pain.    [provider]      Allergies    Red dye #40 (allura red), Strawberry extract, Tomato, Aspirin, Tape, and Wound dressing adhesive    Review of Systems   Review of Systems  Respiratory:  Positive for shortness of breath.     Physical Exam Updated Vital Signs BP 113/70 (BP Location: Left Arm)   Pulse 80   Temp 97.6 F (36.4 C) (Oral)   Resp (!) 24   Ht 5\' 3"  (1.6 m)   Wt 45.4 kg   SpO2 99%   BMI 17.71 kg/m  Physical Exam Vitals and nursing note reviewed.  Constitutional:      General: She is not in acute distress.    Appearance: She is well-developed.  HENT:     Head: Normocephalic and atraumatic.  Eyes:     Conjunctiva/sclera: Conjunctivae normal.  Cardiovascular:     Rate and Rhythm: Normal rate and regular rhythm.  Pulmonary:     Effort: Pulmonary effort is normal. No respiratory distress.     Breath sounds: Wheezing and rhonchi present.     Comments: Diffuse wheeze/rhonchi bilateral lung fields.  No tachypnea.  Patient 98% on at home 2 L nasal cannula. Abdominal:     Palpations: Abdomen is soft.     Tenderness: There is no abdominal tenderness.  Musculoskeletal:        General: No swelling.     Cervical back: Neck supple.  Skin:    General: Skin is warm and dry.     Capillary Refill: Capillary refill takes less than 2 seconds.  Neurological:     Mental Status: She is alert.  Psychiatric:        Mood and Affect: Mood normal.     ED Results / Procedures / Treatments   Labs (all labs ordered are listed, but only abnormal results are displayed) Labs Reviewed  SARS  CORONAVIRUS 2 BY RT PCR  BASIC METABOLIC PANEL WITH GFR  CBC  TROPONIN I (HIGH SENSITIVITY)    EKG None  Radiology No results found.  Procedures Procedures  {Document cardiac monitor, telemetry assessment procedure when appropriate:1}  Medications Ordered in ED Medications  ipratropium-albuterol (DUONEB) 0.5-2.5 (3) MG/3ML nebulizer solution 3 mL (has no administration in time range)  albuterol (PROVENTIL) (2.5 MG/3ML) 0.083% nebulizer solution 2.5 mg (has no administration in time range)    ED Course/ Medical Decision Making/ A&P   {   Click here for ABCD2, HEART and other calculatorsREFRESH Note before signing :1}  Medical Decision Making Amount and/or Complexity of Data Reviewed Labs: ordered. Radiology: ordered.  Risk Prescription drug management.   This patient presents to the ED for concern of shortness of breath, this involves an extensive number of treatment options, and is a complaint that carries with it a high risk of complications and morbidity.  The differential diagnosis includes, asthma, PE, ACS, PE, pneumothorax, CHF, anemia, hemothorax, pneumothorax other   Co morbidities that complicate the patient evaluation  See HPI   Additional history obtained:  Additional history obtained from EMR External records from outside source obtained and reviewed including hospital records chest   Lab Tests:  I Ordered, and personally interpreted labs.  The pertinent results include: COVID-negative.  Leukocytosis of 12.8.  No evidence of anemia.  Platelets within range.  Mild hypokalemia 3.2 otherwise electrolytes within the limits.  No renal dysfunction.  Initial troponin of 6 with repeat***   Imaging Studies ordered:  I ordered imaging studies including x-ray I independently visualized and interpreted imaging which showed acute cardiopulmonary abnormality. I agree with the radiologist interpretation   Cardiac Monitoring: /  EKG:  The patient was maintained on a cardiac monitor.  I personally viewed and interpreted the cardiac monitored which showed an underlying rhythm of: Sinus rhythm.  Right atrial enlargement.  LVH.   Consultations Obtained:  N/a   Problem List / ED Course / Critical interventions / Medication management  COPD exacerbation I ordered medication including albuterol, DuoNeb   Reevaluation of the patient after these medicines showed that the patient improved I have reviewed the patients home medicines and have made adjustments as needed   Social Determinants of Health:  Chronic cigarette use.  Denies illicit drug use.   Test / Admission - Considered:  COPD exacerbation Vitals signs within normal range and stable throughout visit. Laboratory/imaging studies significant for: See above 68 year old female presents emergency department with some shortness of breath.  Reports increased shortness of breath yesterday with persistent since then today prompting visit to emergency department.  Patient does state that she feels like her "lungs are tight".  Was given Solu-Medrol, magnesium, DuoNeb with some improvement of symptoms.  Patient states that she has been using albuterol breathing treatments at home without significant improvement.  Denies any fevers, chills, abdominal pain, nausea, vomiting.  Does report increased cough along with her symptoms as well.  Patient states that it feels like her COPD is flaring up. *** Worrisome signs and symptoms were discussed with the patient, and the patient acknowledged understanding to return to the ED if noticed. Patient was stable upon discharge.    {Document critical care time when appropriate:1} {Document review of labs and clinical decision tools ie heart score, Chads2Vasc2 etc:1}  {Document your independent review of radiology images, and any outside records:1} {Document your discussion with family members, caretakers, and with  consultants:1} {Document social determinants of health affecting pt's care:1} {Document your decision making why or why not admission, treatments were needed:1} Final Clinical Impression(s) / ED Diagnoses Final diagnoses:  None    Rx / DC Orders ED Discharge Orders     None

## 2023-12-12 NOTE — ED Provider Notes (Cosign Needed Addendum)
 [place] EMERGENCY DEPARTMENT AT [facility] Provider Note   CSN: [id number] Arrival date & time: 12/12/23  2215     History  Chief Complaint  Patient presents with   Shortness of Breath    Tracy Park is a 68 y.o. female.   Shortness of Breath   68 year old female presents emergency department with some shortness of breath.  Reports increased shortness of breath yesterday with persistent since then today prompting visit to emergency department.  Patient does state that she feels like her "lungs are tight".  Was given Solu-Medrol, magnesium, DuoNeb with some improvement of symptoms.  Patient states that she has been using albuterol breathing treatments at home without significant improvement.  Denies any fevers, chills, abdominal pain, nausea, vomiting.  Does report increased cough along with her symptoms as well.  Patient states that it feels like her COPD is flaring up.  Past medical history significant for lung cancer with left upper lobectomy 2018 with chemotherapy completion 2019, paroxysmal atrial fibrillation on Eliquis, chronic respiratory failure with hypoxia on 2 L, degenerative disc disease, COPD, OSA, GAD  Home Medications Prior to Admission medications   Medication Sig Start Date End Date Taking? Authorizing Provider  albuterol (PROVENTIL) (2.5 MG/3ML) 0.083% nebulizer solution Take 3 mLs (2.5 mg total) by nebulization every 4 (four) hours while awake for 3 days, THEN 3 mLs (2.5 mg total) every 4 (four) hours as needed for wheezing or shortness of breath. Patient taking differently: Inhale 3 mLs (2.5 mg total) every 4 (four) hours as needed for wheezing or shortness of breath. 10/18/23 12/13/23 Yes Charlynne Pander, MD  albuterol (VENTOLIN HFA) 108 (90 Base) MCG/ACT inhaler Inhale 2 puffs into the lungs every 6 (six) hours as needed for wheezing or shortness of breath. 11/07/23  Yes Amin, Ankit C, MD  apixaban (ELIQUIS) 5 MG TABS tablet Take 1 tablet (5 mg  total) by mouth 2 (two) times daily. 06/06/23  Yes Jonah Blue, MD  Budeson-Glycopyrrol-Formoterol (BREZTRI AEROSPHERE) 160-9-4.8 MCG/ACT AERO Inhale 1 puff into the lungs in the morning and at bedtime.   Yes [provider]  doxycycline (VIBRAMYCIN) 100 MG capsule Take 1 capsule (100 mg total) by mouth 2 (two) times daily. 12/03/23  Yes Curatolo, Adam, DO  levothyroxine (SYNTHROID) 125 MCG tablet Take 125 mcg by mouth daily before breakfast.   Yes [provider]  losartan (COZAAR) 25 MG tablet Take 12.5 mg by mouth daily. 08/29/23  Yes [provider]  magnesium oxide (MAG-OX) 400 (240 Mg) MG tablet Take 400 mg by mouth in the morning.   Yes [provider]  metoprolol succinate (TOPROL-XL) 25 MG 24 hr tablet Take 0.5 tablets (12.5 mg total) by mouth daily. 06/22/23 12/13/23 Yes Narda Bonds, MD  moxifloxacin (VIGAMOX) 0.5 % ophthalmic solution Place 1 drop into the left eye 4 (four) times daily. 11/19/23  Yes [provider]  nitroGLYCERIN (NITROSTAT) 0.4 MG SL tablet Place 0.4 mg under the tongue every 5 (five) minutes as needed for chest pain.   Yes [provider]  oxyCODONE (ROXICODONE) 5 MG immediate release tablet Take 1 tablet (5 mg total) by mouth every 6 (six) hours as needed for severe pain (pain score 7-10). 12/03/23  Yes Curatolo, Adam, DO  OXYGEN Inhale 2 L/min into the lungs continuous.   Yes [provider]  prednisoLONE acetate (PRED FORTE) 1 % ophthalmic suspension Place 1 drop into the left eye 4 (four) times daily. 11/19/23  Yes [provider]  predniSONE (DELTASONE) 20 MG tablet Take 2 tablets (40 mg total) by mouth daily with breakfast for 5 days. 12/13/23 12/18/23 Yes Sherian Maroon A, PA  rosuvastatin (CRESTOR) 20 MG tablet Take 20 mg by mouth daily.   Yes [provider]  sertraline (ZOLOFT) 25 MG tablet Take 25 mg by mouth in the morning.   Yes [provider]  SYSTANE COMPLETE PF 0.6 %  SOLN Place 1 drop into both eyes 4 (four) times daily.   Yes [provider]  TYLENOL 8 HOUR 650 MG CR tablet Take 650-1,300 mg by mouth 2 (two) times daily as needed for pain.   Yes [provider]  amoxicillin-clavulanate (AUGMENTIN) 875-125 MG tablet Take 1 tablet by mouth every 12 (twelve) hours. Patient not taking: Reported on 12/13/2023 11/25/23   Small, Brooke L, PA      Allergies    Red dye #40 (allura red), Strawberry extract, Tomato, Aspirin, Tape, and Wound dressing adhesive    Review of Systems   Review of Systems  Respiratory:  Positive for shortness of breath.     Physical Exam Updated Vital Signs BP 107/68   Pulse 73   Temp 97.7 F (36.5 C) (Oral)   Resp 17   Ht 5\' 3"  (1.6 m)   Wt 45.4 kg   SpO2 96%   BMI 17.71 kg/m  Physical Exam Vitals and nursing note reviewed.  Constitutional:      General: She is not in acute distress.    Appearance: She is well-developed.  HENT:     Head: Normocephalic and atraumatic.  Eyes:     Conjunctiva/sclera: Conjunctivae normal.  Cardiovascular:     Rate and Rhythm: Normal rate and regular rhythm.  Pulmonary:     Effort: Pulmonary effort is normal. No respiratory distress.     Breath sounds: Wheezing and rhonchi present.     Comments: Diffuse wheeze/rhonchi bilateral lung fields.  No tachypnea.  Patient 98% on at home 2 L nasal cannula. Abdominal:     Palpations: Abdomen is soft.     Tenderness: There is no abdominal tenderness.  Musculoskeletal:        General: No swelling.     Cervical back: Neck supple.  Skin:    General: Skin is warm and dry.     Capillary Refill: Capillary refill takes less than 2 seconds.  Neurological:     Mental Status: She is alert.  Psychiatric:        Mood and Affect: Mood normal.     ED Results / Procedures / Treatments   Labs (all labs ordered are listed, but only abnormal results are displayed) Labs Reviewed  BASIC METABOLIC PANEL WITH GFR - Abnormal; Notable for the  following components:      Result Value   Potassium 3.2 (*)    Glucose, Bld 114 (*)    Calcium 8.0 (*)    All other components within normal limits  CBC - Abnormal; Notable for the following components:   WBC 12.8 (*)    All other components within normal limits  SARS CORONAVIRUS 2 BY RT PCR  BASIC METABOLIC PANEL WITH GFR  CBC WITH DIFFERENTIAL/PLATELET  TROPONIN I (HIGH SENSITIVITY)  TROPONIN I (HIGH SENSITIVITY)    EKG None  Radiology CT Angio Chest PE W/Cm &/Or Wo Cm Result Date: 12/13/2023 CLINICAL DATA:  Suspected pulmonary embolism. EXAM: CT ANGIOGRAPHY CHEST WITH CONTRAST TECHNIQUE: Multidetector CT imaging of the chest was performed using the standard protocol during  bolus administration of intravenous contrast. Multiplanar CT image reconstructions and MIPs were obtained to evaluate the vascular anatomy. RADIATION DOSE REDUCTION: This exam was performed according to the departmental dose-optimization program which includes automated exposure control, adjustment of the mA and/or kV according to patient size and/or use of iterative reconstruction technique. CONTRAST:  80mL OMNIPAQUE IOHEXOL 350 MG/ML SOLN COMPARISON:  November 25, 2023 FINDINGS: Cardiovascular: There is marked severity calcification of the aortic arch, without evidence of aortic aneurysm. Satisfactory opacification of the pulmonary arteries to the segmental level. No evidence of pulmonary embolism. Normal heart size with mild coronary artery calcification. No pericardial effusion. Mediastinum/Nodes: No enlarged mediastinal, hilar, or axillary lymph nodes. Thyroid gland, trachea, and esophagus demonstrate no significant findings. Lungs/Pleura: There is evidence of prior left upper lobectomy with subsequent left-sided volume loss. Emphysematous lung disease is also seen with bullous changes and chronic scarring seen within the superior segment of the left lower lobe. Mild, stable subpleural consolidation is seen along the  posterior aspect of the right upper lobe. Mild posterior left lower lobe and posterior right basilar linear atelectasis is seen. No pleural effusion or pneumothorax is identified. Upper Abdomen: No acute abnormality. Musculoskeletal: Postoperative changes are seen within the visualized portion of the lower cervical spine. No acute osseous abnormalities are identified. Review of the MIP images confirms the above findings. IMPRESSION: 1. No evidence of pulmonary embolism. 2. Evidence of prior left upper lobectomy with subsequent left-sided volume loss. 3. Emphysematous lung disease with bullous changes and chronic scarring within the superior segment of the left lower lobe. 4. Mild, stable subpleural consolidation along the posterior aspect of the right upper lobe. 5. Mild posterior left lower lobe and posterior right basilar linear atelectasis. 6. Aortic atherosclerosis. Aortic Atherosclerosis (ICD10-I70.0) and Emphysema (ICD10-J43.9). Electronically Signed   By: Aram Candela M.D.   On: 12/13/2023 03:19   DG Chest 2 View Result Date: 12/12/2023 CLINICAL DATA:  Chest pain EXAM: CHEST - 2 VIEW COMPARISON:  12/09/2023, 08/06/2023 FINDINGS: Right lung is grossly clear. Chronic volume loss and postoperative change in the left thorax prick no acute airspace disease, pleural effusion or pneumothorax. Emphysema. IMPRESSION: No active cardiopulmonary disease. Emphysema. Electronically Signed   By: Jasmine Pang M.D.   On: 12/12/2023 23:33    Procedures Procedures    Medications Ordered in ED Medications  oxyCODONE (Oxy IR/ROXICODONE) immediate release tablet 5 mg (0 mg Oral Hold 12/13/23 0241)  mometasone-formoterol (DULERA) 200-5 MCG/ACT inhaler 2 puff (has no administration in time range)  albuterol (PROVENTIL) (2.5 MG/3ML) 0.083% nebulizer solution 2.5 mg (has no administration in time range)  acetaminophen (TYLENOL) tablet 650 mg (has no administration in time range)    Or  acetaminophen (TYLENOL)  suppository 650 mg (has no administration in time range)  methylPREDNISolone sodium succinate (SOLU-MEDROL) 40 mg/mL injection 40 mg (has no administration in time range)    Followed by  predniSONE (DELTASONE) tablet 40 mg (has no administration in time range)  ipratropium (ATROVENT) nebulizer solution 0.5 mg (has no administration in time range)  apixaban (ELIQUIS) tablet 5 mg (has no administration in time range)  levothyroxine (SYNTHROID) tablet 125 mcg (has no administration in time range)  losartan (COZAAR) tablet 12.5 mg (has no administration in time range)  metoprolol succinate (TOPROL-XL) 24 hr tablet 12.5 mg (has no administration in time range)  oxyCODONE (Oxy IR/ROXICODONE) immediate release tablet 5 mg (has no administration in time range)  rosuvastatin (CRESTOR) tablet 20 mg (has no administration in time range)  sertraline (  ZOLOFT) tablet 25 mg (has no administration in time range)  polyvinyl alcohol (LIQUIFILM TEARS) 1.4 % ophthalmic solution 1 drop (has no administration in time range)  prednisoLONE acetate (PRED FORTE) 1 % ophthalmic suspension 1 drop (has no administration in time range)  gatifloxacin (ZYMAXID) 0.5 % ophthalmic drops 1 drop (has no administration in time range)  magnesium oxide (MAG-OX) tablet 400 mg (has no administration in time range)  benzonatate (TESSALON) capsule 100 mg (has no administration in time range)  mometasone-formoterol (DULERA) 100-5 MCG/ACT inhaler 2 puff (has no administration in time range)  umeclidinium bromide (INCRUSE ELLIPTA) 62.5 MCG/ACT 1 puff (has no administration in time range)  ipratropium-albuterol (DUONEB) 0.5-2.5 (3) MG/3ML nebulizer solution 3 mL (3 mLs Nebulization Given 12/12/23 2336)  albuterol (PROVENTIL) (2.5 MG/3ML) 0.083% nebulizer solution 2.5 mg (2.5 mg Nebulization Given 12/12/23 2336)  iohexol (OMNIPAQUE) 350 MG/ML injection 80 mL (80 mLs Intravenous Contrast Given 12/13/23 0254)    ED Course/ Medical Decision Making/  A&P Clinical Course as of 12/13/23 0523  Sat Dec 13, 2023  0247 Reassessment of the patient showed still with right greater than left lung pain worsened with inspiration.  States that her breathing has improved with nebulized therapy.  Shared decision made conversation was had regarding CT angio chest PE study for rule out of PE and more detailed picture of the chest and patient knowledge understanding and desire to have imaging study performed. [CR]    Clinical Course User Index [CR] Peter Garter, PA                                 Medical Decision Making Amount and/or Complexity of Data Reviewed Labs: ordered. Radiology: ordered.  Risk Prescription drug management. Decision regarding hospitalization.   This patient presents to the ED for concern of shortness of breath, this involves an extensive number of treatment options, and is a complaint that carries with it a high risk of complications and morbidity.  The differential diagnosis includes, asthma, PE, ACS, PE, pneumothorax, CHF, anemia, hemothorax, pneumothorax other   Co morbidities that complicate the patient evaluation  See HPI   Additional history obtained:  Additional history obtained from EMR External records from outside source obtained and reviewed including hospital records chest   Lab Tests:  I Ordered, and personally interpreted labs.  The pertinent results include: COVID-negative.  Leukocytosis of 12.8.  No evidence of anemia.  Platelets within range.  Mild hypokalemia 3.2 otherwise electrolytes within the limits.  No renal dysfunction.  Initial troponin of 6 with repeat 6   Imaging Studies ordered:  I ordered imaging studies including x-ray I independently visualized and interpreted imaging which showed acute cardiopulmonary abnormality. I agree with the radiologist interpretation   Cardiac Monitoring: / EKG:  The patient was maintained on a cardiac monitor.  I personally viewed and interpreted  the cardiac monitored which showed an underlying rhythm of: Sinus rhythm.  Right atrial enlargement.  LVH.   Consultations Obtained:  N/a   Problem List / ED Course / Critical interventions / Medication management  COPD exacerbation I ordered medication including albuterol, DuoNeb   Reevaluation of the patient after these medicines showed that the patient improved I have reviewed the patients home medicines and have made adjustments as needed   Social Determinants of Health:  Chronic cigarette use.  Denies illicit drug use.   Test / Admission - Considered:  COPD exacerbation Vitals signs within  normal range and stable throughout visit. Laboratory/imaging studies significant for: See above 68 year old female presents emergency department with some shortness of breath.  Reports increased shortness of breath yesterday with persistent since then today prompting visit to emergency department.  Patient does state that she feels like her "lungs are tight".  Was given Solu-Medrol, magnesium, DuoNeb with some improvement of symptoms.  Patient states that she has been using albuterol breathing treatments at home without significant improvement.  Denies any fevers, chills, abdominal pain, nausea, vomiting.  Does report increased cough along with her symptoms as well.  Patient states that it feels like her COPD is flaring up. On exam, diffuse wheeze/Rales auscultated bilateral lung fields.  Patient with normal O2 sats on room air at home 2 L nasal cannula.  Workup today overall reassuring.  Patient with delta negative troponin, lack of acute ischemia change on EKG; low suspicion for ACS.  Patient with CT PE study negative for PE, pneumonia, pneumothorax.  Patient without clinical evidence of volume overload and without evidence of pulmonary edema on chest imaging; low suspicion for CHF.  Suspect patient symptoms likely secondary to COPD.  Treated with Solu-Medrol, magnesium via EMS with nebulized  therapies while in the ED with some improvement.  Patient was ambulated by nursing staff with increased exertional dyspnea and worsening chest discomfort.  Patient reportedly maintained O2 sats on her nasal cannula.  Patient states she lives by herself and does not feel like she can manage her symptoms at home safely.  Will pursue admission.        Final Clinical Impression(s) / ED Diagnoses Final diagnoses:  COPD exacerbation (HCC)  Dyspnea, unspecified type    Rx / DC Orders ED Discharge Orders          Ordered    predniSONE (DELTASONE) 20 MG tablet  Daily with breakfast        12/13/23 0350                Peter Garter, PA 12/13/23 0981    Shon Baton, MD 12/15/23 [phone]

## 2023-12-12 NOTE — ED Triage Notes (Signed)
 Patient BIB EMS for evaluation of SHOB.  Hx of stage III lung cancer with removal of L upper lobe.  Was seen by PCP yesterday due to increasing SHOB and wheezing.  States she was trying to "treat it at home," but tonight "my right lung started hurting."  Has been using Albuterol at home without improvement.  Given DuoNeb, SoluMedrol 125 mg IV, and Magnesium 2 mg IV PTA.  No reports of fever

## 2023-12-13 ENCOUNTER — Emergency Department (HOSPITAL_COMMUNITY)

## 2023-12-13 DIAGNOSIS — E039 Hypothyroidism, unspecified: Secondary | ICD-10-CM | POA: Diagnosis not present

## 2023-12-13 DIAGNOSIS — J441 Chronic obstructive pulmonary disease with (acute) exacerbation: Secondary | ICD-10-CM

## 2023-12-13 DIAGNOSIS — J9611 Chronic respiratory failure with hypoxia: Secondary | ICD-10-CM

## 2023-12-13 DIAGNOSIS — I480 Paroxysmal atrial fibrillation: Secondary | ICD-10-CM

## 2023-12-13 DIAGNOSIS — F319 Bipolar disorder, unspecified: Secondary | ICD-10-CM

## 2023-12-13 DIAGNOSIS — J9621 Acute and chronic respiratory failure with hypoxia: Secondary | ICD-10-CM

## 2023-12-13 LAB — BASIC METABOLIC PANEL WITH GFR
Anion gap: 9 (ref 5–15)
BUN: 16 mg/dL (ref 8–23)
CO2: 32 mmol/L (ref 22–32)
Calcium: 8.8 mg/dL — ABNORMAL LOW (ref 8.9–10.3)
Chloride: 97 mmol/L — ABNORMAL LOW (ref 98–111)
Creatinine, Ser: 0.57 mg/dL (ref 0.44–1.00)
GFR, Estimated: >60 mL/min (ref >60)
Glucose, Bld: 145 mg/dL — ABNORMAL HIGH (ref 70–99)
Potassium: 4.2 mmol/L (ref 3.5–5.1)
Sodium: 138 mmol/L (ref 135–145)

## 2023-12-13 LAB — CBC WITH DIFFERENTIAL/PLATELET
Abs Immature Granulocytes: 0.03 10*3/uL (ref 0.00–0.07)
Basophils Absolute: 0 10*3/uL (ref 0.0–0.1)
Basophils Relative: 0 %
Eosinophils Absolute: 0 10*3/uL (ref 0.0–0.5)
Eosinophils Relative: 0 %
HCT: 43.5 % (ref 36.0–46.0)
Hemoglobin: 13 g/dL (ref 12.0–15.0)
Immature Granulocytes: 0 %
Lymphocytes Relative: 8 %
Lymphs Abs: 0.6 10*3/uL — ABNORMAL LOW (ref 0.7–4.0)
MCH: 28.8 pg (ref 26.0–34.0)
MCHC: 29.9 g/dL — ABNORMAL LOW (ref 30.0–36.0)
MCV: 96.5 fL (ref 80.0–100.0)
Monocytes Absolute: 0.1 10*3/uL (ref 0.1–1.0)
Monocytes Relative: 1 %
Neutro Abs: 7.5 10*3/uL (ref 1.7–7.7)
Neutrophils Relative %: 91 %
Platelets: 275 10*3/uL (ref 150–400)
RBC: 4.51 MIL/uL (ref 3.87–5.11)
RDW: 13.8 % (ref 11.5–15.5)
WBC: 8.2 10*3/uL (ref 4.0–10.5)
nRBC: 0 % (ref 0.0–0.2)

## 2023-12-13 LAB — TROPONIN I (HIGH SENSITIVITY): Troponin I (High Sensitivity): 6 ng/L (ref <18)

## 2023-12-13 MED ORDER — ALBUTEROL SULFATE (2.5 MG/3ML) 0.083% IN NEBU
2.5000 mg | INHALATION_SOLUTION | Freq: Three times a day (TID) | RESPIRATORY_TRACT | Status: DC
  Administered 2023-12-13 – 2023-12-14 (×2): 2.5 mg via RESPIRATORY_TRACT
  Filled 2023-12-13 (×2): qty 3

## 2023-12-13 MED ORDER — OXYCODONE HCL 5 MG PO TABS: 5.0000 mg | ORAL_TABLET | Freq: Once | ORAL | Status: DC

## 2023-12-13 MED ORDER — METHYLPREDNISOLONE SODIUM SUCC 40 MG IJ SOLR: 40.0000 mg | Freq: Two times a day (BID) | INTRAMUSCULAR | Status: DC

## 2023-12-13 MED ORDER — SERTRALINE HCL 25 MG PO TABS
25.0000 mg | ORAL_TABLET | Freq: Every day | ORAL | Status: DC
  Administered 2023-12-13 – 2023-12-19 (×7): 25 mg via ORAL
  Filled 2023-12-13 (×7): qty 1

## 2023-12-13 MED ORDER — APIXABAN 5 MG PO TABS
5.0000 mg | ORAL_TABLET | Freq: Two times a day (BID) | ORAL | Status: DC
  Administered 2023-12-13 – 2023-12-19 (×13): 5 mg via ORAL
  Filled 2023-12-13 (×13): qty 1

## 2023-12-13 MED ORDER — PREDNISONE 20 MG PO TABS: 40.0000 mg | ORAL_TABLET | Freq: Every day | ORAL | Status: DC

## 2023-12-13 MED ORDER — ACETAMINOPHEN 325 MG PO TABS: 650.0000 mg | ORAL_TABLET | Freq: Four times a day (QID) | ORAL | Status: DC | PRN

## 2023-12-13 MED ORDER — IPRATROPIUM BROMIDE 0.02 % IN SOLN
0.5000 mg | Freq: Four times a day (QID) | RESPIRATORY_TRACT | Status: DC
Start: 2023-12-13 — End: 2023-12-13
  Administered 2023-12-13: 0.5 mg via RESPIRATORY_TRACT
  Filled 2023-12-13 (×2): qty 2.5

## 2023-12-13 MED ORDER — ENSURE ENLIVE PO LIQD
237.0000 mL | Freq: Three times a day (TID) | ORAL | Status: DC
  Administered 2023-12-13 – 2023-12-18 (×16): 237 mL via ORAL

## 2023-12-13 MED ORDER — ACETAMINOPHEN 650 MG RE SUPP: 650.0000 mg | Freq: Four times a day (QID) | RECTAL | Status: DC | PRN

## 2023-12-13 MED ORDER — MAGNESIUM OXIDE -MG SUPPLEMENT 400 (240 MG) MG PO TABS
400.0000 mg | ORAL_TABLET | Freq: Every day | ORAL | Status: DC
  Administered 2023-12-13 – 2023-12-19 (×7): 400 mg via ORAL
  Filled 2023-12-13 (×7): qty 1

## 2023-12-13 MED ORDER — MOMETASONE FURO-FORMOTEROL FUM 200-5 MCG/ACT IN AERO
2.0000 | INHALATION_SPRAY | Freq: Two times a day (BID) | RESPIRATORY_TRACT | Status: DC
  Filled 2023-12-13: qty 8.8

## 2023-12-13 MED ORDER — OXYCODONE HCL 5 MG PO TABS
5.0000 mg | ORAL_TABLET | Freq: Four times a day (QID) | ORAL | Status: DC | PRN
  Administered 2023-12-13 – 2023-12-14 (×2): 5 mg via ORAL
  Filled 2023-12-13 (×2): qty 1

## 2023-12-13 MED ORDER — ALBUTEROL SULFATE (2.5 MG/3ML) 0.083% IN NEBU: 2.5000 mg | INHALATION_SOLUTION | RESPIRATORY_TRACT | Status: DC | PRN

## 2023-12-13 MED ORDER — IPRATROPIUM BROMIDE 0.02 % IN SOLN: 0.5000 mg | Freq: Four times a day (QID) | RESPIRATORY_TRACT | Status: DC

## 2023-12-13 MED ORDER — METHYLPREDNISOLONE SODIUM SUCC 40 MG IJ SOLR
40.0000 mg | Freq: Two times a day (BID) | INTRAMUSCULAR | Status: AC
  Administered 2023-12-13 (×2): 40 mg via INTRAVENOUS
  Filled 2023-12-13 (×2): qty 1

## 2023-12-13 MED ORDER — IOHEXOL 350 MG/ML SOLN
80.0000 mL | Freq: Once | INTRAVENOUS | Status: AC | PRN
  Administered 2023-12-13: 80 mL via INTRAVENOUS

## 2023-12-13 MED ORDER — SODIUM CHLORIDE (PF) 0.9 % IJ SOLN
INTRAMUSCULAR | Status: AC
  Filled 2023-12-13: qty 50

## 2023-12-13 MED ORDER — POLYVINYL ALCOHOL 1.4 % OP SOLN
1.0000 [drp] | Freq: Four times a day (QID) | OPHTHALMIC | Status: DC
  Administered 2023-12-13 – 2023-12-19 (×22): 1 [drp] via OPHTHALMIC
  Filled 2023-12-13: qty 15

## 2023-12-13 MED ORDER — PREDNISONE 20 MG PO TABS: 40.0000 mg | ORAL_TABLET | Freq: Every day | ORAL | 0 refills | Status: DC

## 2023-12-13 MED ORDER — ADULT MULTIVITAMIN W/MINERALS CH
1.0000 | ORAL_TABLET | Freq: Every day | ORAL | Status: DC
  Administered 2023-12-13 – 2023-12-19 (×7): 1 via ORAL
  Filled 2023-12-13 (×7): qty 1

## 2023-12-13 MED ORDER — BUDESON-GLYCOPYRROL-FORMOTEROL 160-9-4.8 MCG/ACT IN AERO: 1.0000 | INHALATION_SPRAY | Freq: Two times a day (BID) | RESPIRATORY_TRACT | Status: DC

## 2023-12-13 MED ORDER — METOPROLOL SUCCINATE ER 25 MG PO TB24
12.5000 mg | ORAL_TABLET | Freq: Every day | ORAL | Status: DC
  Administered 2023-12-14 – 2023-12-19 (×6): 12.5 mg via ORAL
  Filled 2023-12-13 (×7): qty 1

## 2023-12-13 MED ORDER — BENZONATATE 100 MG PO CAPS
100.0000 mg | ORAL_CAPSULE | Freq: Three times a day (TID) | ORAL | Status: DC | PRN
  Administered 2023-12-14 – 2023-12-17 (×5): 100 mg via ORAL
  Filled 2023-12-13 (×5): qty 1

## 2023-12-13 MED ORDER — MOMETASONE FURO-FORMOTEROL FUM 100-5 MCG/ACT IN AERO
2.0000 | INHALATION_SPRAY | Freq: Two times a day (BID) | RESPIRATORY_TRACT | Status: DC
  Administered 2023-12-13 – 2023-12-19 (×13): 2 via RESPIRATORY_TRACT
  Filled 2023-12-13: qty 8.8

## 2023-12-13 MED ORDER — UMECLIDINIUM BROMIDE 62.5 MCG/ACT IN AEPB
1.0000 | INHALATION_SPRAY | Freq: Every day | RESPIRATORY_TRACT | Status: DC
  Administered 2023-12-13 – 2023-12-19 (×7): 1 via RESPIRATORY_TRACT
  Filled 2023-12-13 (×2): qty 7

## 2023-12-13 MED ORDER — LEVOTHYROXINE SODIUM 25 MCG PO TABS
125.0000 ug | ORAL_TABLET | Freq: Every day | ORAL | Status: DC
  Administered 2023-12-13 – 2023-12-19 (×7): 125 ug via ORAL
  Filled 2023-12-13 (×7): qty 1

## 2023-12-13 MED ORDER — ROSUVASTATIN CALCIUM 20 MG PO TABS
20.0000 mg | ORAL_TABLET | Freq: Every day | ORAL | Status: DC
  Administered 2023-12-13 – 2023-12-19 (×7): 20 mg via ORAL
  Filled 2023-12-13 (×7): qty 1

## 2023-12-13 MED ORDER — LOSARTAN POTASSIUM 25 MG PO TABS
12.5000 mg | ORAL_TABLET | Freq: Every day | ORAL | Status: DC
  Administered 2023-12-14: 12.5 mg via ORAL
  Filled 2023-12-13 (×3): qty 0.5

## 2023-12-13 MED ORDER — GATIFLOXACIN 0.5 % OP SOLN
1.0000 [drp] | Freq: Four times a day (QID) | OPHTHALMIC | Status: DC
  Administered 2023-12-13 – 2023-12-19 (×22): 1 [drp] via OPHTHALMIC
  Filled 2023-12-13: qty 2.5

## 2023-12-13 MED ORDER — PREDNISOLONE ACETATE 1 % OP SUSP
1.0000 [drp] | Freq: Four times a day (QID) | OPHTHALMIC | Status: DC
Start: 2023-12-13 — End: 2023-12-19
  Administered 2023-12-13 – 2023-12-19 (×22): 1 [drp] via OPHTHALMIC
  Filled 2023-12-13: qty 5

## 2023-12-13 MED ORDER — UMECLIDINIUM-VILANTEROL 62.5-25 MCG/ACT IN AEPB
1.0000 | INHALATION_SPRAY | Freq: Every day | RESPIRATORY_TRACT | Status: DC
  Filled 2023-12-13: qty 14

## 2023-12-13 NOTE — Progress Notes (Signed)
 Mobility Specialist - Progress Note  (2L [place]) Pre-mobility: 94% SpO2 During mobility: 89% SpO2 Post-mobility: 90% SPO2   12/13/23 1524  Mobility  Activity Ambulated independently in hallway  Level of Assistance Standby assist, set-up cues, supervision of patient - no hands on  Assistive Device None  Distance Ambulated (ft) 450 ft  Range of Motion/Exercises Active  Activity Response Tolerated well  Mobility Referral Yes  Mobility visit 1 Mobility  Mobility Specialist Start Time (ACUTE ONLY) 1510  Mobility Specialist Stop Time (ACUTE ONLY) 1524  Mobility Specialist Time Calculation (min) (ACUTE ONLY) 14 min   Pt was found in bed and agreeable to ambulate. Stated feeling a little SOB. At EOS returned to bed with all needs met. Call bell in reach and bed alarm on.  Billey Chang Mobility Specialist

## 2023-12-13 NOTE — Progress Notes (Signed)
 Initial Nutrition Assessment  DOCUMENTATION CODES:   Underweight  INTERVENTION:   -Liberalize diet to regular for widest variety of meal selections -Ensure Enlive po TID, each supplement provides 350 kcal and 20 grams of protein -MVI with minerals daily  NUTRITION DIAGNOSIS:   Increased nutrient needs related to chronic illness (COPD) as evidenced by estimated needs.  GOAL:   Patient will meet greater than or equal to 90% of their needs  MONITOR:   PO intake, Supplement acceptance  REASON FOR ASSESSMENT:   Consult Assessment of nutrition requirement/status  ASSESSMENT:   Pt with history of lung cancer stage III status post lobectomy in 2018, COPD, chronic respiratory failure 2 L nasal cannula baseline, bipolar disorder, paroxysmal atrial fibrillation, hypothyroidism.  She presents with complaints of dyspnea with activity.  Pt admitted with COPD exacerbation.  Pt unavailable at time of visit. Attempted to speak with pt via call to hospital room phone, however, unable to reach. RD unable to obtain further nutrition-related history or complete nutrition-focused physical exam at this time.    Per H&P, pt has shortness of breath at baseline, however, this has worsened over the past 2 days PTA.  Pt currently on a heart healthy diet. No meal completion data available to assess at this time.   Reviewed wt hx; pt has experienced a 4.1% wt loss over the past 3 months, which is not significant for time frame.   Pt with history of severe malnutrition, which has been identified by prior RD visits. RD suspects malnutrition is ongoing, however, unable to identify at this time. Pt would greatly benefit from addition of oral nutrition supplements; pt has taken Ensure supplements in the past.   Medications reviewed and include magnesium oxide and solu-medrol.  Labs reviewed.    Diet Order:   Diet Order             Diet Heart Room service appropriate? Yes; Fluid consistency: Thin   Diet effective now                   EDUCATION NEEDS:   No education needs have been identified at this time  Skin:  Skin Assessment: Reviewed RN Assessment  Last BM:  12/12/23  Height:   Ht Readings from Last 1 Encounters:  12/12/23 5\' 3"  (1.6 m)    Weight:   Wt Readings from Last 1 Encounters:  12/13/23 44.1 kg    Ideal Body Weight:  52.3 kg  BMI:  Body mass index is 17.22 kg/m.  Estimated Nutritional Needs:   Kcal:  1750-1950  Protein:  90-105 grams  Fluid:  1.7-1.9 L    Levada Schilling, RD, LDN, CDCES Registered Dietitian III Certified Diabetes Care and Education Specialist If unable to reach this RD, please use "RD Inpatient" group chat on secure chat between hours of 8am-4 pm daily

## 2023-12-13 NOTE — Progress Notes (Signed)
 Patient admitted after midnight, please see H&P.  Here with SOB:  A/P:  Acute exacerbation of chronic obstructive pulmonary disease/Chronic respiratory failure -COPD order set initiated -Continue home oxygen (wears 2L at home) - Tessalon Perles as needed ordered Flutter valve -IV Solu-Medrol, scheduled and as needed nebulizers -Dulera, Incruse Ellipta ordered    History of adenocarcinoma of lung (HCC)   Bipolar disorder (HCC)// PTSD -Zoloft resumed    HTN -Cozaar, metoprolol resumed    HLD -Crestor resumed   Paroxysmal atrial fibrillation (HCC) -Eliquis   Hypothyroidism -Synthroid resumed   Tobacco use -encourage cessation -Declines nicotine patch  Marlin Canary DO

## 2023-12-13 NOTE — ED Notes (Signed)
 Patient ambulated in hallway with baseline oxygen.  Pt able to maintain oxygen saturation, however states "she feels the same as she did when she came in."  PA made aware

## 2023-12-13 NOTE — H&P (Signed)
 PCP:   Loleta Dicker, FNP   Chief Complaint:  Shortness of breath  HPI: This is a 68 year old female with history of lung cancer stage III status post lobectomy in 2018, COPD, chronic respiratory failure 2 L nasal cannula baseline, bipolar disorder, paroxysmal atrial fibrillation, hypothyroidism.  She presents to the ER with complaints of dyspnea with activity.  Per patient she has chronic shortness of breath however over the past 2 days has gotten significantly worse.  Patient endorses wheezing, cough, shortness of breath, chest tightness with ambulation.  She denies fevers or chills.  Patient states she is only able to walk 3-4 steps without becoming extremely short of breath.  She came to the ER.  In the ER patient treated with IV Solu-Medrol, magnesium, nebulizers.  Patient improved.  Patient remains on her baseline home oxygen.  Attempts made at ambulation, patient became quite dyspneic and her chest tightness returned.  Admission requested.  Review of Systems:  Per HPI  Past Medical History: Past Medical History:  Diagnosis Date   Anginal pain (HCC)    Anxiety    Bipolar disorder (HCC)    Cancer (HCC)    COPD (chronic obstructive pulmonary disease) (HCC)    Dyspnea    Family history of adverse reaction to anesthesia    History of kidney stones    Hydroureteronephrosis 08/16/2021   Hypothyroidism    Lung cancer (HCC)    Myocardial infarction (HCC)    Paroxysmal atrial fibrillation (HCC)    PTSD (post-traumatic stress disorder)    Sleep apnea    Thyroid disease    Past Surgical History:  Procedure Laterality Date   ABDOMINAL HYSTERECTOMY     BACK SURGERY     CYSTOSCOPY W/ URETERAL STENT PLACEMENT Right 05/03/2021   Procedure: CYSTOSCOPY WITH RETROGRADE PYELOGRAM/URETERAL STENT PLACEMENT;  Surgeon: Crist Fat, MD;  Location: [facility];  Service: Urology;  Laterality: Right;   EYE SURGERY     kidney stent     thyroidectomy      Medications: Prior to Admission  medications   Medication Sig Start Date End Date Taking? Authorizing Provider  albuterol (PROVENTIL) (2.5 MG/3ML) 0.083% nebulizer solution Take 3 mLs (2.5 mg total) by nebulization every 4 (four) hours while awake for 3 days, THEN 3 mLs (2.5 mg total) every 4 (four) hours as needed for wheezing or shortness of breath. Patient taking differently: Inhale 3 mLs (2.5 mg total) every 4 (four) hours as needed for wheezing or shortness of breath. 10/18/23 12/13/23 Yes Charlynne Pander, MD  albuterol (VENTOLIN HFA) 108 (90 Base) MCG/ACT inhaler Inhale 2 puffs into the lungs every 6 (six) hours as needed for wheezing or shortness of breath. 11/07/23  Yes Amin, Ankit C, MD  apixaban (ELIQUIS) 5 MG TABS tablet Take 1 tablet (5 mg total) by mouth 2 (two) times daily. 06/06/23  Yes Jonah Blue, MD  Budeson-Glycopyrrol-Formoterol (BREZTRI AEROSPHERE) 160-9-4.8 MCG/ACT AERO Inhale 1 puff into the lungs in the morning and at bedtime.   Yes [provider]  doxycycline (VIBRAMYCIN) 100 MG capsule Take 1 capsule (100 mg total) by mouth 2 (two) times daily. 12/03/23  Yes Curatolo, Adam, DO  levothyroxine (SYNTHROID) 125 MCG tablet Take 125 mcg by mouth daily before breakfast.   Yes [provider]  losartan (COZAAR) 25 MG tablet Take 12.5 mg by mouth daily. 08/29/23  Yes [provider]  magnesium oxide (MAG-OX) 400 (240 Mg) MG tablet Take 400 mg by mouth in the morning.   Yes  [provider]  metoprolol succinate (TOPROL-XL) 25 MG 24 hr tablet Take 0.5 tablets (12.5 mg total) by mouth daily. 06/22/23 12/13/23 Yes Narda Bonds, MD  moxifloxacin (VIGAMOX) 0.5 % ophthalmic solution Place 1 drop into the left eye 4 (four) times daily. 11/19/23  Yes [provider]  nitroGLYCERIN (NITROSTAT) 0.4 MG SL tablet Place 0.4 mg under the tongue every 5 (five) minutes as needed for chest pain.   Yes [provider]  oxyCODONE (ROXICODONE) 5 MG immediate release tablet Take 1  tablet (5 mg total) by mouth every 6 (six) hours as needed for severe pain (pain score 7-10). 12/03/23  Yes Curatolo, Adam, DO  OXYGEN Inhale 2 L/min into the lungs continuous.   Yes [provider]  prednisoLONE acetate (PRED FORTE) 1 % ophthalmic suspension Place 1 drop into the left eye 4 (four) times daily. 11/19/23  Yes [provider]  predniSONE (DELTASONE) 20 MG tablet Take 2 tablets (40 mg total) by mouth daily with breakfast for 5 days. 12/13/23 12/18/23 Yes Sherian Maroon A, PA  rosuvastatin (CRESTOR) 20 MG tablet Take 20 mg by mouth daily.   Yes [provider]  sertraline (ZOLOFT) 25 MG tablet Take 25 mg by mouth in the morning.   Yes [provider]  SYSTANE COMPLETE PF 0.6 % SOLN Place 1 drop into both eyes 4 (four) times daily.   Yes [provider]  TYLENOL 8 HOUR 650 MG CR tablet Take 650-1,300 mg by mouth 2 (two) times daily as needed for pain.   Yes [provider]  amoxicillin-clavulanate (AUGMENTIN) 875-125 MG tablet Take 1 tablet by mouth every 12 (twelve) hours. Patient not taking: Reported on 12/13/2023 11/25/23   Small, Brooke L, PA    Allergies:   Allergies  Allergen Reactions   Red Dye #40 (Allura Red) Hives, Itching and Other (See Comments)    Red food dye   Strawberry Extract Hives and Itching   Tomato Hives and Itching   Aspirin Hives   Tape Rash and Other (See Comments)    Prefers paper tape   Wound Dressing Adhesive Rash    Social History:  reports that she has been smoking cigarettes. She has never used smokeless tobacco. She reports that she does not currently use alcohol. She reports that she does not currently use drugs.  Family History: Family History  Problem Relation Age of Onset   Hypertension Mother    Heart failure Mother    Hypertension Father    Diabetes Father    Heart failure Father     Physical Exam: Vitals:   12/13/23 0130 12/13/23 0200 12/13/23 0410 12/13/23 0415  BP: 96/61 92/62  106/73 109/69  Pulse: 76 72 78 83  Resp: 18 (!) 21 19 17   Temp:    97.7 F (36.5 C)  TempSrc:    Oral  SpO2: 93% 93% 95% 96%  Weight:      Height:        General:  Alert and oriented times three, cachectic female Eyes: Pink conjunctiva, no scleral icterus ENT: Moist oral mucosa, neck supple Lungs: CTA B/L, no wheeze, no crackles, no use of accessory muscles Cardiovascular: RRR, no regurgitation, no gallops, no murmurs. No carotid bruits, no JVD Abdomen: soft, positive BS, NTND, not an acute abdomen Neuro: CN II - XII grossly intact, sensation intact Musculoskeletal: strength 5/5 all extremities, no edema Skin: no rash, no subcutaneous crepitation, no decubitus Psych: appropriate patient  Labs on Admission:  Recent  Labs    12/12/23 2252  NA 142  K 3.2*  CL 103  CO2 30  GLUCOSE 114*  BUN 11  CREATININE 0.47  CALCIUM 8.0*    Micro Results: Recent Results (from the past 240 hours)  Resp panel by RT-PCR (RSV, Flu A&B, Covid) Anterior Nasal Swab     Status: None   Collection Time: 12/03/23  3:32 PM   Specimen: Anterior Nasal Swab  Result Value Ref Range Status   SARS Coronavirus 2 by RT PCR NEGATIVE NEGATIVE Final    Comment: (NOTE) SARS-CoV-2 target nucleic acids are NOT DETECTED.  The SARS-CoV-2 RNA is generally detectable in upper respiratory specimens during the acute phase of infection. The lowest concentration of SARS-CoV-2 viral copies this assay can detect is 138 copies/mL. A negative result does not preclude SARS-Cov-2 infection and should not be used as the sole basis for treatment or other patient management decisions. A negative result may occur with  improper specimen collection/handling, submission of specimen other than nasopharyngeal swab, presence of viral mutation(s) within the areas targeted by this assay, and inadequate number of viral copies(<138 copies/mL). A negative result must be combined with clinical observations, patient history, and  epidemiological information. The expected result is Negative.  Fact Sheet for Patients:  BloggerCourse.com  Fact Sheet for Healthcare Providers:  SeriousBroker.it  This test is no t yet approved or cleared by the [place] FDA and  has been authorized for detection and/or diagnosis of SARS-CoV-2 by FDA under an Emergency Use Authorization (EUA). This EUA will remain  in effect (meaning this test can be used) for the duration of the COVID-19 declaration under Section 564(b)(1) of the Act, 21 U.S.C.section 360bbb-3(b)(1), unless the authorization is terminated  or revoked sooner.       Influenza A by PCR NEGATIVE NEGATIVE Final   Influenza B by PCR NEGATIVE NEGATIVE Final    Comment: (NOTE) The Xpert Xpress SARS-CoV-2/FLU/RSV plus assay is intended as an aid in the diagnosis of influenza from Nasopharyngeal swab specimens and should not be used as a sole basis for treatment. Nasal washings and aspirates are unacceptable for Xpert Xpress SARS-CoV-2/FLU/RSV testing.  Fact Sheet for Patients: BloggerCourse.com  Fact Sheet for Healthcare Providers: SeriousBroker.it  This test is not yet approved or cleared by the [place] FDA and has been authorized for detection and/or diagnosis of SARS-CoV-2 by FDA under an Emergency Use Authorization (EUA). This EUA will remain in effect (meaning this test can be used) for the duration of the COVID-19 declaration under Section 564(b)(1) of the Act, 21 U.S.C. section 360bbb-3(b)(1), unless the authorization is terminated or revoked.     Resp Syncytial Virus by PCR NEGATIVE NEGATIVE Final    Comment: (NOTE) Fact Sheet for Patients: BloggerCourse.com  Fact Sheet for Healthcare Providers: SeriousBroker.it  This test is not yet approved or cleared by the [place] FDA and has been  authorized for detection and/or diagnosis of SARS-CoV-2 by FDA under an Emergency Use Authorization (EUA). This EUA will remain in effect (meaning this test can be used) for the duration of the COVID-19 declaration under Section 564(b)(1) of the Act, 21 U.S.C. section 360bbb-3(b)(1), unless the authorization is terminated or revoked.  Performed at [facility], [address], [place], [place] [address]   SARS Coronavirus 2 by RT PCR (hospital order, performed in [facility] hospital lab) *cepheid single result test* Anterior Nasal Swab     Status: None   Collection Time: 12/12/23 10:52 PM  Specimen: Anterior Nasal Swab  Result Value Ref Range Status   SARS Coronavirus 2 by RT PCR NEGATIVE NEGATIVE Final    Comment: (NOTE) SARS-CoV-2 target nucleic acids are NOT DETECTED.  The SARS-CoV-2 RNA is generally detectable in upper and lower respiratory specimens during the acute phase of infection. The lowest concentration of SARS-CoV-2 viral copies this assay can detect is 250 copies / mL. A negative result does not preclude SARS-CoV-2 infection and should not be used as the sole basis for treatment or other patient management decisions.  A negative result may occur with improper specimen collection / handling, submission of specimen other than nasopharyngeal swab, presence of viral mutation(s) within the areas targeted by this assay, and inadequate number of viral copies (<250 copies / mL). A negative result must be combined with clinical observations, patient history, and epidemiological information.  Fact Sheet for Patients:   RoadLapTop.co.za  Fact Sheet for Healthcare Providers: http://kim-miller.com/  This test is not yet approved or  cleared by the [place] FDA and has been authorized for detection and/or diagnosis of SARS-CoV-2 by FDA under an Emergency Use Authorization (EUA).  This EUA will remain in  effect (meaning this test can be used) for the duration of the COVID-19 declaration under Section 564(b)(1) of the Act, 21 U.S.C. section 360bbb-3(b)(1), unless the authorization is terminated or revoked sooner.  Performed at [facility], [address] [place], [place] [address]      Radiological Exams on Admission: CT Angio Chest PE W/Cm &/Or Wo Cm Result Date: 12/13/2023 CLINICAL DATA:  Suspected pulmonary embolism. EXAM: CT ANGIOGRAPHY CHEST WITH CONTRAST TECHNIQUE: Multidetector CT imaging of the chest was performed using the standard protocol during bolus administration of intravenous contrast. Multiplanar CT image reconstructions and MIPs were obtained to evaluate the vascular anatomy. RADIATION DOSE REDUCTION: This exam was performed according to the departmental dose-optimization program which includes automated exposure control, adjustment of the mA and/or kV according to patient size and/or use of iterative reconstruction technique. CONTRAST:  80mL OMNIPAQUE IOHEXOL 350 MG/ML SOLN COMPARISON:  November 25, 2023 FINDINGS: Cardiovascular: There is marked severity calcification of the aortic arch, without evidence of aortic aneurysm. Satisfactory opacification of the pulmonary arteries to the segmental level. No evidence of pulmonary embolism. Normal heart size with mild coronary artery calcification. No pericardial effusion. Mediastinum/Nodes: No enlarged mediastinal, hilar, or axillary lymph nodes. Thyroid gland, trachea, and esophagus demonstrate no significant findings. Lungs/Pleura: There is evidence of prior left upper lobectomy with subsequent left-sided volume loss. Emphysematous lung disease is also seen with bullous changes and chronic scarring seen within the superior segment of the left lower lobe. Mild, stable subpleural consolidation is seen along the posterior aspect of the right upper lobe. Mild posterior left lower lobe and posterior right basilar linear  atelectasis is seen. No pleural effusion or pneumothorax is identified. Upper Abdomen: No acute abnormality. Musculoskeletal: Postoperative changes are seen within the visualized portion of the lower cervical spine. No acute osseous abnormalities are identified. Review of the MIP images confirms the above findings. IMPRESSION: 1. No evidence of pulmonary embolism. 2. Evidence of prior left upper lobectomy with subsequent left-sided volume loss. 3. Emphysematous lung disease with bullous changes and chronic scarring within the superior segment of the left lower lobe. 4. Mild, stable subpleural consolidation along the posterior aspect of the right upper lobe. 5. Mild posterior left lower lobe and posterior right basilar linear atelectasis. 6. Aortic atherosclerosis. Aortic Atherosclerosis (ICD10-I70.0) and Emphysema (ICD10-J43.9). Electronically Signed  By: Aram Candela M.D.   On: 12/13/2023 03:19   DG Chest 2 View Result Date: 12/12/2023 CLINICAL DATA:  Chest pain EXAM: CHEST - 2 VIEW COMPARISON:  12/09/2023, 08/06/2023 FINDINGS: Right lung is grossly clear. Chronic volume loss and postoperative change in the left thorax prick no acute airspace disease, pleural effusion or pneumothorax. Emphysema. IMPRESSION: No active cardiopulmonary disease. Emphysema. Electronically Signed   By: Jasmine Pang M.D.   On: 12/12/2023 23:33    Assessment/Plan Present on Admission:  Acute exacerbation of chronic obstructive pulmonary disease   Chronic respiratory failure -COPD order set initiated -Continue home oxygen.  Tessalon Perles as needed ordered -IV Solu-Medrol, scheduled and as needed nebulizers -Dulera, Incruse Ellipta ordered   History of adenocarcinoma of lung (HCC)   Bipolar disorder (HCC)// PTSD -Zoloft resumed   HTN -Cozaar, metoprolol resumed   HLD -Crestor resumed   Paroxysmal atrial fibrillation (HCC) -Eliquis   Hypothyroidism -Synthroid resumed   Tobacco use -Declines nicotine  patch  Abdi Husak 12/13/2023, 4:29 AM

## 2023-12-13 NOTE — Discharge Instructions (Signed)
 As discussed, your workup today was overall reassuring.  Your heart enzymes were normal and EKG appeared unchanged; low suspicion for heart attack.  CT imaging did not show evidence of blood clot in the lung, collapsed lung, pneumonia or other acute abnormality.  Suspect your symptoms are likely secondary to COPD exacerbation.  Will place you on steroids for the next few days.  Recommend follow-up with your pulmonologist in the outpatient setting.  Continue to use at home breathing treatments.  Please do not hesitate to return if the worrisome signs and symptoms we discussed become apparent.

## 2023-12-13 NOTE — Evaluation (Signed)
 Physical Therapy Evaluation Patient Details Name: Tracy Park MRN: [id number] DOB: Sep 15, 1955 Today's Date: 12/13/2023  History of Present Illness  Tracy Park is a 68 y.o. female who presents to the ER on 12/12/23 with complaints of dyspnea with activity.  Per patient she has chronic shortness of breath however over the past 2 days has gotten significantly worse.  Patient endorses wheezing, cough, shortness of breath, chest tightness with ambulation.  She denies fevers or chills.  Patient states she is only able to walk 3-4 steps without becoming extremely short of breath.  Pt recently came to the ER. admitted 11/01/23 with SOB and wheezing as well. Dx with acute COPD exacerbation.  PMH includes stage 3 lung cancer s/p left upper lobe resection greater than 5 years ago (in remission), COPD, chronic hypoxic respiratory failure on 2 L [place] at home, anxiety and depression, paroxysmal atrial fibrillation on Eliquis, hypothyroidism, ongoing tobacco use  Clinical Impression  Pt admitted as above and presenting with functional mobility limitations 2* generalized weakness, mild ambulatory balance deficits, and decreased activity tolerance.  Pt plans dc home to prior living arrangement with continued assist of family/friends.      If plan is discharge home, recommend the following: A little help with walking and/or transfers;A little help with bathing/dressing/bathroom;Assistance with cooking/housework;Assist for transportation;Help with stairs or ramp for entrance   Can travel by private vehicle        Equipment Recommendations None recommended by PT  Recommendations for Other Services       Functional Status Assessment Patient has had a recent decline in their functional status and demonstrates the ability to make significant improvements in function in a reasonable and predictable amount of time.     Precautions / Restrictions Precautions Precautions: Fall Precaution/Restrictions Comments:  Mon O2. Restrictions Weight Bearing Restrictions Per Provider Order: No Other Position/Activity Restrictions: Pt on 2L and able to maintain SpO2 at 92% with activity/ambulation and 97% at rest. HR after ambulation was 93. Pt is good at pacing self and taking rest breaks when needed.      Mobility  Bed Mobility Overal bed mobility: Modified Independent                  Transfers Overall transfer level: Needs assistance Equipment used: None Transfers: Sit to/from Stand Sit to Stand: Contact guard assist           General transfer comment: Steady assist only to move to standing    Ambulation/Gait Ambulation/Gait assistance: Contact guard assist Gait Distance (Feet): 150 Feet (150 twice with extended standing rest break between) Assistive device: None Gait Pattern/deviations: Step-through pattern, Decreased step length - right, Decreased step length - left, Shuffle, Narrow base of support, Staggering left, Staggering right Gait velocity: decr     General Gait Details: Steady assist for intermittent instability - pt reluctant to use assistive device  Stairs            Wheelchair Mobility     Tilt Bed    Modified Rankin (Stroke Patients Only)       Balance Overall balance assessment: Mild deficits observed, not formally tested                                           Pertinent Vitals/Pain Pain Assessment Pain Assessment: 0-10 Pain Score: 9  Pain Location: Feet Pain Descriptors / Indicators: Cramping, Spasm  Pain Intervention(s): Limited activity within patient's tolerance, Monitored during session, Premedicated before session    Home Living Family/patient expects to be discharged to:: Private residence Living Arrangements: Other relatives Available Help at Discharge: Family;Available 24 hours/day Type of Home: Mobile home Home Access: Stairs to enter Entrance Stairs-Rails: Right;Left;Can reach both Entrance Stairs-Number of  Steps: 3   Home Layout: One level Home Equipment: Cane - single point;Rollator (4 wheels);Shower seat;Grab bars - tub/shower;Hand held shower head Additional Comments: On 2 L O2 at home 24/7    Prior Function Prior Level of Function : Independent/Modified Independent             Mobility Comments: In home ambulated without AD; in community would use AD; fell and hurt my leg ~2 months ago. Now recovered. ADLs Comments: Ind with ADLs/selfcare, IADLs, home mgt, cooking, no longer drives bc she does not have a car; can go out in community but typically family does shopping     Extremity/Trunk Assessment   Upper Extremity Assessment Upper Extremity Assessment: Overall WFL for tasks assessed    Lower Extremity Assessment Lower Extremity Assessment: Generalized weakness    Cervical / Trunk Assessment Cervical / Trunk Assessment: Other exceptions Cervical / Trunk Exceptions: underweight  Communication   Communication Communication: No apparent difficulties    Cognition Arousal: Alert Behavior During Therapy: WFL for tasks assessed/performed   PT - Cognitive impairments: No apparent impairments                         Following commands: Intact       Cueing       General Comments      Exercises Other Exercises Other Exercises: Pt educated on fall prevention at home and receptive to this.   Assessment/Plan    PT Assessment Patient needs continued PT services  PT Problem List Decreased strength;Decreased range of motion;Decreased activity tolerance;Decreased balance;Decreased mobility;Decreased knowledge of use of DME;Pain       PT Treatment Interventions DME instruction;Gait training;Stair training;Functional mobility training;Therapeutic activities;Therapeutic exercise;Patient/family education    PT Goals (Current goals can be found in the Care Plan section)  Acute Rehab PT Goals Patient Stated Goal: Return to prior living arrangement PT Goal  Formulation: With patient Time For Goal Achievement: 12/27/23 Potential to Achieve Goals: Good    Frequency Min 3X/week     Co-evaluation PT/OT/SLP Co-Evaluation/Treatment: Yes Reason for Co-Treatment: To address functional/ADL transfers PT goals addressed during session: Mobility/safety with mobility OT goals addressed during session: ADL's and self-care       AM-PAC PT "6 Clicks" Mobility  Outcome Measure Help needed turning from your back to your side while in a flat bed without using bedrails?: None Help needed moving from lying on your back to sitting on the side of a flat bed without using bedrails?: None Help needed moving to and from a bed to a chair (including a wheelchair)?: A Little Help needed standing up from a chair using your arms (e.g., wheelchair or bedside chair)?: A Little Help needed to walk in hospital room?: A Little Help needed climbing 3-5 steps with a railing? : A Little 6 Click Score: 20    End of Session Equipment Utilized During Treatment: Gait belt Activity Tolerance: Patient tolerated treatment well Patient left: in bed;with call bell/phone within reach;with bed alarm set Nurse Communication: Mobility status PT Visit Diagnosis: Unsteadiness on feet (R26.81);Muscle weakness (generalized) (M62.81)    Time: 1610-9604 PT Time Calculation (min) (ACUTE ONLY): 32  min   Charges:   PT Evaluation $PT Eval Low Complexity: 1 Low   PT General Charges $$ ACUTE PT VISIT: 1 Visit         Mauro Kaufmann PT Acute Rehabilitation Services Pager [phone] Office [phone]   Adonna Horsley 12/13/2023, 3:40 PM

## 2023-12-13 NOTE — Evaluation (Signed)
 Occupational Therapy Evaluation Patient Details Name: Tracy Park MRN: [id number] DOB: 1956-08-27 Today's Date: 12/13/2023   History of Present Illness   Tracy Park is a 68 y.o. female who presents to the ER on 12/12/23 with complaints of dyspnea with activity.  Per patient she has chronic shortness of breath however over the past 2 days has gotten significantly worse.  Patient endorses wheezing, cough, shortness of breath, chest tightness with ambulation.  She denies fevers or chills.  Patient states she is only able to walk 3-4 steps without becoming extremely short of breath.  Pt recently came to the ER. admitted 11/01/23 with SOB and wheezing as well. Dx with acute COPD exacerbation.  PMH includes stage 3 lung cancer s/p left upper lobe resection greater than 5 years ago (in remission), COPD, chronic hypoxic respiratory failure on 2 L [place] at home, anxiety and depression, paroxysmal atrial fibrillation on Eliquis, hypothyroidism, ongoing tobacco use     Clinical Impressions Patient evaluated by Occupational Therapy with no further acute OT needs identified. All education has been completed and the patient has no further questions. Pt is showing some mild balance deficits especially when distracted with head turn during ambulation, but is used to furniture cruising in her mobile home with BUE support and has rollator for community outings as well as good support at home.  Pt is self-initiating energy conservation strategies including pacing, rest breaks, deep breathing, and eliminating unnecessary effort during ADLs (use of slip on shoes/slippers for example).  See below for any follow-up Occupational Therapy or equipment needs. OT is signing off. Thank you for this referral.       If plan is discharge home, recommend the following:   Assistance with cooking/housework;Assist for transportation     Functional Status Assessment   Patient has had a recent decline in their functional  status and demonstrates the ability to make significant improvements in function in a reasonable and predictable amount of time.     Equipment Recommendations   None recommended by OT     Recommendations for Other Services         Precautions/Restrictions   Precautions Precautions: Fall Precaution/Restrictions Comments: Mon O2. Restrictions Weight Bearing Restrictions Per Provider Order: No Other Position/Activity Restrictions: Pt on 2L and able to maintain SpO2 at 92% with activity/ambulation and 97% at rest. HR after ambulation was 93. Pt is good at pacing self and taking rest breaks when needed.     Mobility Bed Mobility Overal bed mobility: Modified Independent (HOb elevated)                  Transfers Overall transfer level: Needs assistance Equipment used: None Transfers: Sit to/from Stand Sit to Stand: Supervision, Contact guard assist           General transfer comment: Mild losses of balance during hallway ambulation necessitating CGA for safety without AD.      Balance Overall balance assessment: Mild deficits observed, not formally tested                                         ADL either performed or assessed with clinical judgement   ADL Overall ADL's : At baseline  General ADL Comments: With exception of mild balance deficits when truning head or distracted during ambulation, pt agrees that she is at her baseline with ADLs and and demonstrated as needed for discharge planning. Pt paces herself without cues. Tends to furniture cruise in her motor home due to size.     Vision Baseline Vision/History: 1 Wears glasses;4 Cataracts (but not now.) Ability to See in Adequate Light: 2 Moderately impaired (Pt has cataract surgery planned. Can use call bell if held close to good eye) Patient Visual Report: No change from baseline       Perception         Praxis          Pertinent Vitals/Pain Pain Assessment Pain Assessment: 0-10 Pain Score: 9  Pain Location: Feet Pain Descriptors / Indicators: Cramping, Spasm Pain Intervention(s): Limited activity within patient's tolerance, Monitored during session, Repositioned, Patient requesting pain meds-RN notified, RN gave pain meds during session (Pt reported that ambulation also helped the pain)     Extremity/Trunk Assessment Upper Extremity Assessment Upper Extremity Assessment: Overall WFL for tasks assessed   Lower Extremity Assessment Lower Extremity Assessment: Defer to PT evaluation   Cervical / Trunk Assessment Cervical / Trunk Assessment: Other exceptions Cervical / Trunk Exceptions: underweight   Communication Communication Communication: No apparent difficulties   Cognition Arousal: Alert Behavior During Therapy: WFL for tasks assessed/performed Cognition: No apparent impairments             OT - Cognition Comments: Very pleasant and likes to joke.                 Following commands: Intact       Cueing  General Comments          Exercises Other Exercises Other Exercises: Pt educated on fall prevention at home and receptive to this.   Shoulder Instructions      Home Living Family/patient expects to be discharged to:: Private residence Living Arrangements: Other relatives Available Help at Discharge: Family;Available 24 hours/day Type of Home: Mobile home Home Access: Stairs to enter Entrance Stairs-Number of Steps: 3 Entrance Stairs-Rails: Right;Left;Can reach both Home Layout: One level     Bathroom Shower/Tub: Chief Strategy Officer: Standard Bathroom Accessibility: Yes How Accessible: Accessible via walker Home Equipment: Cane - single point;Rollator (4 wheels);Shower seat;Grab bars - tub/shower;Hand held shower head   Additional Comments: On 2 L O2 at home 24/7      Prior Functioning/Environment Prior Level of Function :  Independent/Modified Independent             Mobility Comments: In home ambulated wtihout AD; in community would use AD; fell and hurt my leg ~2 months ago. Now recovered. ADLs Comments: Ind with ADLs/selfcare, IADLs, home mgt, cooking, no longer drives bc she does not have a car; can go out in community but typically family does shopping    OT Problem List: Decreased activity tolerance   OT Treatment/Interventions:        OT Goals(Current goals can be found in the care plan section)   Acute Rehab OT Goals Patient Stated Goal: Go home OT Goal Formulation: All assessment and education complete, DC therapy   OT Frequency:       Co-evaluation PT/OT/SLP Co-Evaluation/Treatment: Yes Reason for Co-Treatment: To address functional/ADL transfers PT goals addressed during session: Mobility/safety with mobility OT goals addressed during session: ADL's and self-care      AM-PAC OT "6 Clicks" Daily Activity     Outcome  Measure Help from another person eating meals?: None Help from another person taking care of personal grooming?: None Help from another person toileting, which includes using toliet, bedpan, or urinal?: None Help from another person bathing (including washing, rinsing, drying)?: A Little Help from another person to put on and taking off regular upper body clothing?: None Help from another person to put on and taking off regular lower body clothing?: A Little 6 Click Score: 22   End of Session Equipment Utilized During Treatment: Gait belt;Oxygen Nurse Communication: Other (comment) (O2 sats)  Activity Tolerance: Patient tolerated treatment well Patient left: in bed;with call bell/phone within reach;with bed alarm set  OT Visit Diagnosis: Unsteadiness on feet (R26.81)                Time: 1610-9604 OT Time Calculation (min): 31 min Charges:  OT General Charges $OT Visit: 1 Visit OT Evaluation $OT Eval Low Complexity: 1 Low  Freeland Pracht, OT Acute Rehab  Services Office: [phone] 12/13/2023   Theodoro Clock 12/13/2023, 1:38 PM

## 2023-12-14 DIAGNOSIS — G4733 Obstructive sleep apnea (adult) (pediatric): Secondary | ICD-10-CM | POA: Diagnosis present

## 2023-12-14 DIAGNOSIS — Z85118 Personal history of other malignant neoplasm of bronchus and lung: Secondary | ICD-10-CM | POA: Diagnosis not present

## 2023-12-14 DIAGNOSIS — J441 Chronic obstructive pulmonary disease with (acute) exacerbation: Secondary | ICD-10-CM | POA: Diagnosis present

## 2023-12-14 DIAGNOSIS — Z5986 Financial insecurity: Secondary | ICD-10-CM | POA: Diagnosis not present

## 2023-12-14 DIAGNOSIS — Z803 Family history of malignant neoplasm of breast: Secondary | ICD-10-CM | POA: Diagnosis not present

## 2023-12-14 DIAGNOSIS — Z7901 Long term (current) use of anticoagulants: Secondary | ICD-10-CM | POA: Diagnosis not present

## 2023-12-14 DIAGNOSIS — E785 Hyperlipidemia, unspecified: Secondary | ICD-10-CM | POA: Diagnosis present

## 2023-12-14 DIAGNOSIS — F4310 Post-traumatic stress disorder, unspecified: Secondary | ICD-10-CM | POA: Diagnosis present

## 2023-12-14 DIAGNOSIS — I480 Paroxysmal atrial fibrillation: Secondary | ICD-10-CM | POA: Diagnosis present

## 2023-12-14 DIAGNOSIS — Z79899 Other long term (current) drug therapy: Secondary | ICD-10-CM | POA: Diagnosis not present

## 2023-12-14 DIAGNOSIS — Z79890 Hormone replacement therapy (postmenopausal): Secondary | ICD-10-CM | POA: Diagnosis not present

## 2023-12-14 DIAGNOSIS — Z681 Body mass index (BMI) 19 or less, adult: Secondary | ICD-10-CM | POA: Diagnosis not present

## 2023-12-14 DIAGNOSIS — I10 Essential (primary) hypertension: Secondary | ICD-10-CM | POA: Diagnosis present

## 2023-12-14 DIAGNOSIS — Z5982 Transportation insecurity: Secondary | ICD-10-CM | POA: Diagnosis not present

## 2023-12-14 DIAGNOSIS — Z66 Do not resuscitate: Secondary | ICD-10-CM | POA: Diagnosis present

## 2023-12-14 DIAGNOSIS — R0602 Shortness of breath: Secondary | ICD-10-CM | POA: Diagnosis present

## 2023-12-14 DIAGNOSIS — I252 Old myocardial infarction: Secondary | ICD-10-CM | POA: Diagnosis not present

## 2023-12-14 DIAGNOSIS — F319 Bipolar disorder, unspecified: Secondary | ICD-10-CM | POA: Diagnosis present

## 2023-12-14 DIAGNOSIS — F17210 Nicotine dependence, cigarettes, uncomplicated: Secondary | ICD-10-CM | POA: Diagnosis present

## 2023-12-14 DIAGNOSIS — Z716 Tobacco abuse counseling: Secondary | ICD-10-CM | POA: Diagnosis not present

## 2023-12-14 DIAGNOSIS — E119 Type 2 diabetes mellitus without complications: Secondary | ICD-10-CM | POA: Diagnosis present

## 2023-12-14 DIAGNOSIS — Z8249 Family history of ischemic heart disease and other diseases of the circulatory system: Secondary | ICD-10-CM | POA: Diagnosis not present

## 2023-12-14 DIAGNOSIS — J9621 Acute and chronic respiratory failure with hypoxia: Secondary | ICD-10-CM | POA: Diagnosis present

## 2023-12-14 DIAGNOSIS — Z800 Family history of malignant neoplasm of digestive organs: Secondary | ICD-10-CM | POA: Diagnosis not present

## 2023-12-14 DIAGNOSIS — R918 Other nonspecific abnormal finding of lung field: Secondary | ICD-10-CM | POA: Diagnosis not present

## 2023-12-14 DIAGNOSIS — Z1152 Encounter for screening for COVID-19: Secondary | ICD-10-CM | POA: Diagnosis not present

## 2023-12-14 MED ORDER — AMOXICILLIN-POT CLAVULANATE 875-125 MG PO TABS
1.0000 | ORAL_TABLET | Freq: Two times a day (BID) | ORAL | Status: DC
  Administered 2023-12-14 – 2023-12-19 (×11): 1 via ORAL
  Filled 2023-12-14 (×11): qty 1

## 2023-12-14 MED ORDER — METHYLPREDNISOLONE SODIUM SUCC 40 MG IJ SOLR
40.0000 mg | Freq: Two times a day (BID) | INTRAMUSCULAR | Status: DC
  Administered 2023-12-14 – 2023-12-19 (×11): 40 mg via INTRAVENOUS
  Filled 2023-12-14 (×11): qty 1

## 2023-12-14 MED ORDER — ALBUTEROL SULFATE (2.5 MG/3ML) 0.083% IN NEBU
2.5000 mg | INHALATION_SOLUTION | Freq: Two times a day (BID) | RESPIRATORY_TRACT | Status: DC
  Administered 2023-12-14 – 2023-12-18 (×8): 2.5 mg via RESPIRATORY_TRACT
  Filled 2023-12-14 (×8): qty 3

## 2023-12-14 MED ORDER — OXYCODONE HCL 5 MG PO TABS
5.0000 mg | ORAL_TABLET | ORAL | Status: DC | PRN
  Administered 2023-12-14 – 2023-12-19 (×8): 5 mg via ORAL
  Filled 2023-12-14 (×8): qty 1

## 2023-12-14 NOTE — Progress Notes (Signed)
 Mobility Specialist - Progress Note   12/14/23 1155  Oxygen Therapy  SpO2 (!) 89 %  O2 Device Nasal Cannula  Patient Activity (if Appropriate) Ambulating  Mobility  Activity Ambulated independently in hallway  Level of Assistance Independent  Assistive Device None  Distance Ambulated (ft) 500 ft  Activity Response Tolerated well  Mobility Referral Yes  Mobility visit 1 Mobility  Mobility Specialist Start Time (ACUTE ONLY) 1143  Mobility Specialist Stop Time (ACUTE ONLY) 1155  Mobility Specialist Time Calculation (min) (ACUTE ONLY) 12 min   Pt received in bed and agreeable to mobility. No complaints during session. Pt to bed after session with all needs met.    Pre-mobility: 93% SpO2 During mobility: 89% SpO2 Post-mobility: 92% SPO2  Chief Technology Officer

## 2023-12-14 NOTE — Progress Notes (Signed)
 PROGRESS NOTE    Tracy Park  ZOX:[id number] DOB: Jan 12, 1956 DOA: 12/12/2023 PCP: Loleta Dicker, FNP    Brief Narrative:  68 year old female with history of lung cancer stage III status post lobectomy in 2018, COPD, chronic respiratory failure 2 L nasal cannula baseline, bipolar disorder, paroxysmal atrial fibrillation, hypothyroidism.  She presents to the ER with complaints of dyspnea with activity.  Per patient she has chronic shortness of breath however over the past 2 days has gotten significantly worse.  Patient endorses wheezing, cough, shortness of breath, chest tightness with ambulation.  She denies fevers or chills.  Patient states she is only able to walk 3-4 steps without becoming extremely short of breath.  She came to the ER     Assessment and Plan:   Acute exacerbation of chronic obstructive pulmonary disease/Chronic respiratory failure -COPD order set initiated -Continue home oxygen (wears 2L at home) - Tessalon Perles as needed ordered Flutter valve -IV Solu-Medrol, scheduled and as needed nebulizers -Dulera, Incruse Ellipta ordered -does not seem to be getting better as fast as I expected, will leave on IV steroids for now monitor another day    History of adenocarcinoma of lung (HCC)   Bipolar disorder (HCC)// PTSD -Zoloft resumed    HTN -Cozaar, metoprolol resumed    HLD -Crestor resumed   Paroxysmal atrial fibrillation (HCC) -Eliquis   Hypothyroidism -Synthroid resumed   Tobacco use -encourage cessation -Declines nicotine patch         DVT prophylaxis:  apixaban (ELIQUIS) tablet 5 mg    Code Status: Limited: Do not attempt resuscitation (DNR) -DNR-LIMITED -Do Not Intubate/DNI    Disposition Plan:  Level of care: Med-Surg Status is: Observation     Consultants:  none  Subjective: Pain with deep breathing  Objective: Vitals:   12/13/23 1719 12/13/23 1823 12/13/23 2135 12/14/23 0428  BP: 112/74  106/63 122/78  Pulse: 81  78 84   Resp: 16  14 14   Temp: 98 F (36.7 C)  98.1 F (36.7 C) 98 F (36.7 C)  TempSrc: Oral  Oral Oral  SpO2: 95% 97% 96% 95%  Weight:      Height:        Intake/Output Summary (Last 24 hours) at 12/14/2023 1136 Last data filed at 12/14/2023 0834 Gross per 24 hour  Intake 957 ml  Output 700 ml  Net 257 ml   Filed Weights   12/12/23 2236 12/13/23 0546  Weight: 45.4 kg 44.1 kg    Examination:   General: Appearance:    Thin female in no acute distress     Lungs:     On O2, respirations unlabored  Heart:    Normal heart rate.    MS:   All extremities are intact.    Neurologic:   Awake, alert       Data Reviewed: I have personally reviewed following labs and imaging studies  CBC: Recent Labs  Lab 12/09/23 1219 12/12/23 2252 12/13/23 0616  WBC 9.7 12.8* 8.2  NEUTROABS 7.8*  --  7.5  HGB 10.9* 12.7 13.0  HCT 36.2 41.1 43.5  MCV 96.8 94.7 96.5  PLT 227 259 275   Basic Metabolic Panel: Recent Labs  Lab 12/09/23 1341 12/12/23 2252 12/13/23 0616  NA 138 142 138  K 3.6 3.2* 4.2  CL 99 103 97*  CO2 31 30 32  GLUCOSE 145* 114* 145*  BUN 14 11 16   CREATININE 0.47 0.47 0.57  CALCIUM 8.4* 8.0* 8.8*   GFR: Estimated Creatinine  Clearance: 48.8 mL/min (by C-G formula based on SCr of 0.57 mg/dL). Liver Function Tests: No results for input(s): "AST", "ALT", "ALKPHOS", "BILITOT", "PROT", "ALBUMIN" in the last 168 hours. No results for input(s): "LIPASE", "AMYLASE" in the last 168 hours. No results for input(s): "AMMONIA" in the last 168 hours. Coagulation Profile: No results for input(s): "INR", "PROTIME" in the last 168 hours. Cardiac Enzymes: No results for input(s): "CKTOTAL", "CKMB", "CKMBINDEX", "TROPONINI" in the last 168 hours. BNP (last 3 results) No results for input(s): "PROBNP" in the last 8760 hours. HbA1C: No results for input(s): "HGBA1C" in the last 72 hours. CBG: No results for input(s): "GLUCAP" in the last 168 hours. Lipid Profile: No results  for input(s): "CHOL", "HDL", "LDLCALC", "TRIG", "CHOLHDL", "LDLDIRECT" in the last 72 hours. Thyroid Function Tests: No results for input(s): "TSH", "T4TOTAL", "FREET4", "T3FREE", "THYROIDAB" in the last 72 hours. Anemia Panel: No results for input(s): "VITAMINB12", "FOLATE", "FERRITIN", "TIBC", "IRON", "RETICCTPCT" in the last 72 hours. Sepsis Labs: No results for input(s): "PROCALCITON", "LATICACIDVEN" in the last 168 hours.  Recent Results (from the past 240 hours)  SARS Coronavirus 2 by RT PCR (hospital order, performed in [facility] hospital lab) *cepheid single result test* Anterior Nasal Swab     Status: None   Collection Time: 12/12/23 10:52 PM   Specimen: Anterior Nasal Swab  Result Value Ref Range Status   SARS Coronavirus 2 by RT PCR NEGATIVE NEGATIVE Final    Comment: (NOTE) SARS-CoV-2 target nucleic acids are NOT DETECTED.  The SARS-CoV-2 RNA is generally detectable in upper and lower respiratory specimens during the acute phase of infection. The lowest concentration of SARS-CoV-2 viral copies this assay can detect is 250 copies / mL. A negative result does not preclude SARS-CoV-2 infection and should not be used as the sole basis for treatment or other patient management decisions.  A negative result may occur with improper specimen collection / handling, submission of specimen other than nasopharyngeal swab, presence of viral mutation(s) within the areas targeted by this assay, and inadequate number of viral copies (<250 copies / mL). A negative result must be combined with clinical observations, patient history, and epidemiological information.  Fact Sheet for Patients:   RoadLapTop.co.za  Fact Sheet for Healthcare Providers: http://kim-miller.com/  This test is not yet approved or  cleared by the [place] FDA and has been authorized for detection and/or diagnosis of SARS-CoV-2 by FDA under an Emergency Use  Authorization (EUA).  This EUA will remain in effect (meaning this test can be used) for the duration of the COVID-19 declaration under Section 564(b)(1) of the Act, 21 U.S.C. section 360bbb-3(b)(1), unless the authorization is terminated or revoked sooner.  Performed at [facility], [address] [place], [place] [address]          Radiology Studies: CT Angio Chest PE W/Cm &/Or Wo Cm Result Date: 12/13/2023 CLINICAL DATA:  Suspected pulmonary embolism. EXAM: CT ANGIOGRAPHY CHEST WITH CONTRAST TECHNIQUE: Multidetector CT imaging of the chest was performed using the standard protocol during bolus administration of intravenous contrast. Multiplanar CT image reconstructions and MIPs were obtained to evaluate the vascular anatomy. RADIATION DOSE REDUCTION: This exam was performed according to the departmental dose-optimization program which includes automated exposure control, adjustment of the mA and/or kV according to patient size and/or use of iterative reconstruction technique. CONTRAST:  80mL OMNIPAQUE IOHEXOL 350 MG/ML SOLN COMPARISON:  November 25, 2023 FINDINGS: Cardiovascular: There is marked severity calcification of the aortic arch, without evidence of aortic  aneurysm. Satisfactory opacification of the pulmonary arteries to the segmental level. No evidence of pulmonary embolism. Normal heart size with mild coronary artery calcification. No pericardial effusion. Mediastinum/Nodes: No enlarged mediastinal, hilar, or axillary lymph nodes. Thyroid gland, trachea, and esophagus demonstrate no significant findings. Lungs/Pleura: There is evidence of prior left upper lobectomy with subsequent left-sided volume loss. Emphysematous lung disease is also seen with bullous changes and chronic scarring seen within the superior segment of the left lower lobe. Mild, stable subpleural consolidation is seen along the posterior aspect of the right upper lobe. Mild posterior left lower lobe  and posterior right basilar linear atelectasis is seen. No pleural effusion or pneumothorax is identified. Upper Abdomen: No acute abnormality. Musculoskeletal: Postoperative changes are seen within the visualized portion of the lower cervical spine. No acute osseous abnormalities are identified. Review of the MIP images confirms the above findings. IMPRESSION: 1. No evidence of pulmonary embolism. 2. Evidence of prior left upper lobectomy with subsequent left-sided volume loss. 3. Emphysematous lung disease with bullous changes and chronic scarring within the superior segment of the left lower lobe. 4. Mild, stable subpleural consolidation along the posterior aspect of the right upper lobe. 5. Mild posterior left lower lobe and posterior right basilar linear atelectasis. 6. Aortic atherosclerosis. Aortic Atherosclerosis (ICD10-I70.0) and Emphysema (ICD10-J43.9). Electronically Signed   By: Aram Candela M.D.   On: 12/13/2023 03:19   DG Chest 2 View Result Date: 12/12/2023 CLINICAL DATA:  Chest pain EXAM: CHEST - 2 VIEW COMPARISON:  12/09/2023, 08/06/2023 FINDINGS: Right lung is grossly clear. Chronic volume loss and postoperative change in the left thorax prick no acute airspace disease, pleural effusion or pneumothorax. Emphysema. IMPRESSION: No active cardiopulmonary disease. Emphysema. Electronically Signed   By: Jasmine Pang M.D.   On: 12/12/2023 23:33        Scheduled Meds:  albuterol  2.5 mg Nebulization BID   amoxicillin-clavulanate  1 tablet Oral Q12H   apixaban  5 mg Oral BID   feeding supplement  237 mL Oral TID BM   gatifloxacin  1 drop Left Eye QID   levothyroxine  125 mcg Oral Q0600   losartan  12.5 mg Oral Daily   magnesium oxide  400 mg Oral Daily   methylPREDNISolone (SOLU-MEDROL) injection  40 mg Intravenous Q12H   metoprolol succinate  12.5 mg Oral Daily   mometasone-formoterol  2 puff Inhalation BID   multivitamin with minerals  1 tablet Oral Daily   oxyCODONE  5 mg  Oral Once   polyvinyl alcohol  1 drop Both Eyes QID   prednisoLONE acetate  1 drop Left Eye QID   rosuvastatin  20 mg Oral Daily   sertraline  25 mg Oral Daily   umeclidinium bromide  1 puff Inhalation Daily   Continuous Infusions:   LOS: 0 days    Time spent: 45 minutes spent on chart review, discussion with nursing staff, consultants, updating family and interview/physical exam; more than 50% of that time was spent in counseling and/or coordination of care.    Joseph Art, DO [facility] Available via Epic secure chat 7am-7pm After these hours, please refer to coverage provider listed on [email or web address] 12/14/2023, 11:36 AM

## 2023-12-14 NOTE — Plan of Care (Signed)

## 2023-12-14 NOTE — Care Management Obs Status (Signed)
 MEDICARE OBSERVATION STATUS NOTIFICATION   Patient Details  Name: RYLYN ZAWISTOWSKI MRN: [id number] Date of Birth: 1956-07-25   Medicare Observation Status Notification Given:  Yes    Larrie Kass, LCSW 12/14/2023, 11:44 AM

## 2023-12-14 NOTE — Plan of Care (Signed)
  Problem: Education: Goal: Knowledge of General Education information will improve Description: Including pain rating scale, medication(s)/side effects and non-pharmacologic comfort measures Outcome: Progressing   Problem: Health Behavior/Discharge Planning: Goal: Ability to manage health-related needs will improve Outcome: Progressing   Problem: Clinical Measurements: Goal: Ability to maintain clinical measurements within normal limits will improve Outcome: Progressing Goal: Will remain free from infection Outcome: Progressing Goal: Diagnostic test results will improve Outcome: Progressing Goal: Respiratory complications will improve Outcome: Progressing   Problem: Activity: Goal: Risk for activity intolerance will decrease Outcome: Progressing   Problem: Coping: Goal: Level of anxiety will decrease Outcome: Progressing   Problem: Education: Goal: Knowledge of disease or condition will improve Outcome: Progressing Goal: Knowledge of the prescribed therapeutic regimen will improve Outcome: Progressing   Problem: Activity: Goal: Will verbalize the importance of balancing activity with adequate rest periods Outcome: Progressing   Problem: Respiratory: Goal: Ability to maintain a clear airway will improve Outcome: Progressing Goal: Levels of oxygenation will improve Outcome: Progressing Goal: Ability to maintain adequate ventilation will improve Outcome: Progressing

## 2023-12-15 DIAGNOSIS — J441 Chronic obstructive pulmonary disease with (acute) exacerbation: Secondary | ICD-10-CM | POA: Diagnosis not present

## 2023-12-15 NOTE — Progress Notes (Signed)
 Mobility Specialist - Progress Note  ([place] 2L) Pre-mobility: 75 bpm HR, 97% SpO2 During mobility: 92% SpO2 Post-mobility: 81 bpm HR, 93% SPO2   12/15/23 0928  Mobility  Activity Ambulated independently in hallway  Level of Assistance Standby assist, set-up cues, supervision of patient - no hands on  Assistive Device None  Distance Ambulated (ft) 450 ft  Range of Motion/Exercises Active  Activity Response Tolerated well  Mobility Referral Yes  Mobility visit 1 Mobility  Mobility Specialist Start Time (ACUTE ONLY) 0915  Mobility Specialist Stop Time (ACUTE ONLY) [id number]  Mobility Specialist Time Calculation (min) (ACUTE ONLY) 13 min   Pt was found in bed and agreeable to ambulate. No complaints with session. At EOS returned to bed with all needs met. Call bell in reach.  Billey Chang Mobility Specialist

## 2023-12-15 NOTE — Progress Notes (Addendum)
 PROGRESS NOTE    Tracy Park  ZOX:[id number] DOB: August 21, 1956 DOA: 12/12/2023 PCP: Loleta Dicker, FNP    Brief Narrative:  68 year old female with history of lung cancer stage III status post lobectomy in 2018, COPD, chronic respiratory failure 2 L nasal cannula baseline, bipolar disorder, paroxysmal atrial fibrillation, hypothyroidism.  She presents to the ER with complaints of dyspnea with activity.  Per patient she has chronic shortness of breath however over the past 2 days has gotten significantly worse.  Patient endorses wheezing, cough, shortness of breath, chest tightness with ambulation.  She denies fevers or chills.  Patient states she is only able to walk 3-4 steps without becoming extremely short of breath.  She came to the ER.  Slow to improve.     Assessment and Plan:   Acute exacerbation of chronic obstructive pulmonary disease/Chronic respiratory failure -COPD order set initiated -Continue home oxygen (wears 2L at home) - Tessalon Perles as needed ordered Flutter valve -IV Solu-Medrol, scheduled and as needed nebulizers -Dulera, Incruse Ellipta ordered -does not seem to be getting better as fast as I expected, will leave on IV steroids for now monitor another day    History of adenocarcinoma of lung (HCC)   Bipolar disorder (HCC)// PTSD -Zoloft resumed    HTN -Cozaar, metoprolol resumed    HLD -Crestor resumed   Paroxysmal atrial fibrillation (HCC) -Eliquis   Hypothyroidism -Synthroid resumed   Tobacco use -encourage cessation -Declines nicotine patch         DVT prophylaxis:  apixaban (ELIQUIS) tablet 5 mg    Code Status: Limited: Do not attempt resuscitation (DNR) -DNR-LIMITED -Do Not Intubate/DNI    Disposition Plan:  Level of care: Med-Surg Status is: Observation     Consultants:  none  Subjective: Continues to not feel well and have discomfort while breathing  Objective: Vitals:   12/14/23 2001 12/14/23 2121 12/15/23 0454  12/15/23 0821  BP:  103/68 101/73   Pulse:  80 70   Resp:  14 14   Temp:  97.9 F (36.6 C) 98 F (36.7 C)   TempSrc:  Oral Oral   SpO2: 98% 94% 96% 100%  Weight:      Height:        Intake/Output Summary (Last 24 hours) at 12/15/2023 1216 Last data filed at 12/15/2023 1000 Gross per 24 hour  Intake 610 ml  Output 1700 ml  Net -1090 ml   Filed Weights   12/12/23 2236 12/13/23 0546  Weight: 45.4 kg 44.1 kg    Examination:   General: Appearance:    Thin female in no acute distress     Lungs:     On O2,  wheezing thoughout  Heart:    Normal heart rate.    MS:   All extremities are intact.    Neurologic:   Awake, alert, irritable       Data Reviewed: I have personally reviewed following labs and imaging studies  CBC: Recent Labs  Lab 12/09/23 1219 12/12/23 2252 12/13/23 0616  WBC 9.7 12.8* 8.2  NEUTROABS 7.8*  --  7.5  HGB 10.9* 12.7 13.0  HCT 36.2 41.1 43.5  MCV 96.8 94.7 96.5  PLT 227 259 275   Basic Metabolic Panel: Recent Labs  Lab 12/09/23 1341 12/12/23 2252 12/13/23 0616  NA 138 142 138  K 3.6 3.2* 4.2  CL 99 103 97*  CO2 31 30 32  GLUCOSE 145* 114* 145*  BUN 14 11 16   CREATININE 0.47 0.47 0.57  CALCIUM 8.4* 8.0* 8.8*   GFR: Estimated Creatinine Clearance: 48.8 mL/min (by C-G formula based on SCr of 0.57 mg/dL). Liver Function Tests: No results for input(s): "AST", "ALT", "ALKPHOS", "BILITOT", "PROT", "ALBUMIN" in the last 168 hours. No results for input(s): "LIPASE", "AMYLASE" in the last 168 hours. No results for input(s): "AMMONIA" in the last 168 hours. Coagulation Profile: No results for input(s): "INR", "PROTIME" in the last 168 hours. Cardiac Enzymes: No results for input(s): "CKTOTAL", "CKMB", "CKMBINDEX", "TROPONINI" in the last 168 hours. BNP (last 3 results) No results for input(s): "PROBNP" in the last 8760 hours. HbA1C: No results for input(s): "HGBA1C" in the last 72 hours. CBG: No results for input(s): "GLUCAP" in the  last 168 hours. Lipid Profile: No results for input(s): "CHOL", "HDL", "LDLCALC", "TRIG", "CHOLHDL", "LDLDIRECT" in the last 72 hours. Thyroid Function Tests: No results for input(s): "TSH", "T4TOTAL", "FREET4", "T3FREE", "THYROIDAB" in the last 72 hours. Anemia Panel: No results for input(s): "VITAMINB12", "FOLATE", "FERRITIN", "TIBC", "IRON", "RETICCTPCT" in the last 72 hours. Sepsis Labs: No results for input(s): "PROCALCITON", "LATICACIDVEN" in the last 168 hours.  Recent Results (from the past 240 hours)  SARS Coronavirus 2 by RT PCR (hospital order, performed in [facility] hospital lab) *cepheid single result test* Anterior Nasal Swab     Status: None   Collection Time: 12/12/23 10:52 PM   Specimen: Anterior Nasal Swab  Result Value Ref Range Status   SARS Coronavirus 2 by RT PCR NEGATIVE NEGATIVE Final    Comment: (NOTE) SARS-CoV-2 target nucleic acids are NOT DETECTED.  The SARS-CoV-2 RNA is generally detectable in upper and lower respiratory specimens during the acute phase of infection. The lowest concentration of SARS-CoV-2 viral copies this assay can detect is 250 copies / mL. A negative result does not preclude SARS-CoV-2 infection and should not be used as the sole basis for treatment or other patient management decisions.  A negative result may occur with improper specimen collection / handling, submission of specimen other than nasopharyngeal swab, presence of viral mutation(s) within the areas targeted by this assay, and inadequate number of viral copies (<250 copies / mL). A negative result must be combined with clinical observations, patient history, and epidemiological information.  Fact Sheet for Patients:   RoadLapTop.co.za  Fact Sheet for Healthcare Providers: http://kim-miller.com/  This test is not yet approved or  cleared by the [place] FDA and has been authorized for detection and/or diagnosis of  SARS-CoV-2 by FDA under an Emergency Use Authorization (EUA).  This EUA will remain in effect (meaning this test can be used) for the duration of the COVID-19 declaration under Section 564(b)(1) of the Act, 21 U.S.C. section 360bbb-3(b)(1), unless the authorization is terminated or revoked sooner.  Performed at [facility], [address] [place], [place] [address]          Radiology Studies: No results found.       Scheduled Meds:  albuterol  2.5 mg Nebulization BID   amoxicillin-clavulanate  1 tablet Oral Q12H   apixaban  5 mg Oral BID   feeding supplement  237 mL Oral TID BM   gatifloxacin  1 drop Left Eye QID   levothyroxine  125 mcg Oral Q0600   magnesium oxide  400 mg Oral Daily   methylPREDNISolone (SOLU-MEDROL) injection  40 mg Intravenous Q12H   metoprolol succinate  12.5 mg Oral Daily   mometasone-formoterol  2 puff Inhalation BID   multivitamin with minerals  1 tablet Oral Daily  oxyCODONE  5 mg Oral Once   polyvinyl alcohol  1 drop Both Eyes QID   prednisoLONE acetate  1 drop Left Eye QID   rosuvastatin  20 mg Oral Daily   sertraline  25 mg Oral Daily   umeclidinium bromide  1 puff Inhalation Daily   Continuous Infusions:   LOS: 1 day    Time spent: 45 minutes spent on chart review, discussion with nursing staff, consultants, updating family and interview/physical exam; more than 50% of that time was spent in counseling and/or coordination of care.    Joseph Art, DO [facility] Available via Epic secure chat 7am-7pm After these hours, please refer to coverage provider listed on [email or web address] 12/15/2023, 12:16 PM

## 2023-12-16 DIAGNOSIS — J441 Chronic obstructive pulmonary disease with (acute) exacerbation: Secondary | ICD-10-CM | POA: Diagnosis not present

## 2023-12-16 LAB — CBC
HCT: 41.2 % (ref 36.0–46.0)
Hemoglobin: 12.2 g/dL (ref 12.0–15.0)
MCH: 28.4 pg (ref 26.0–34.0)
MCHC: 29.6 g/dL — ABNORMAL LOW (ref 30.0–36.0)
MCV: 96 fL (ref 80.0–100.0)
Platelets: 253 10*3/uL (ref 150–400)
RBC: 4.29 MIL/uL (ref 3.87–5.11)
RDW: 13.6 % (ref 11.5–15.5)
WBC: 11.4 10*3/uL — ABNORMAL HIGH (ref 4.0–10.5)
nRBC: 0 % (ref 0.0–0.2)

## 2023-12-16 LAB — BASIC METABOLIC PANEL WITH GFR
Anion gap: 7 (ref 5–15)
BUN: 26 mg/dL — ABNORMAL HIGH (ref 8–23)
CO2: 30 mmol/L (ref 22–32)
Calcium: 8.6 mg/dL — ABNORMAL LOW (ref 8.9–10.3)
Chloride: 98 mmol/L (ref 98–111)
Creatinine, Ser: 0.74 mg/dL (ref 0.44–1.00)
GFR, Estimated: >60 mL/min (ref >60)
Glucose, Bld: 203 mg/dL — ABNORMAL HIGH (ref 70–99)
Potassium: 3.9 mmol/L (ref 3.5–5.1)
Sodium: 135 mmol/L (ref 135–145)

## 2023-12-16 NOTE — Progress Notes (Signed)
 PROGRESS NOTE    Tracy Park  ZOX:[id number] DOB: Jul 03, 1956 DOA: 12/12/2023 PCP: Loleta Dicker, FNP    Brief Narrative:  68 year old female with history of lung cancer stage III status post lobectomy in 2018, COPD, chronic respiratory failure 2 L nasal cannula baseline, bipolar disorder, paroxysmal atrial fibrillation, hypothyroidism.  She presents to the ER with complaints of dyspnea with activity.  Per patient she has chronic shortness of breath however over the past 2 days has gotten significantly worse.  Patient endorses wheezing, cough, shortness of breath, chest tightness with ambulation.  She denies fevers or chills.  Patient states she is only able to walk 3-4 steps without becoming extremely short of breath.  She came to the ER.  Slow to improve, poor overall prognosis.   Assessment and Plan:   Acute exacerbation of chronic obstructive pulmonary disease/Chronic respiratory failure -COPD order set initiated -Continue home oxygen (wears 2L at home) - Tessalon Perles as needed ordered Flutter valve -IV Solu-Medrol, scheduled and as needed nebulizers -Dulera, Incruse Ellipta ordered -does not seem to be getting better as fast as I expected, will leave on IV steroids for now monitor another day- chart review shows about 5-6 days usually for improvement    History of adenocarcinoma of lung (HCC)   Bipolar disorder (HCC)// PTSD -Zoloft resumed    HTN -Cozaar, metoprolol resumed    HLD -Crestor resumed   Paroxysmal atrial fibrillation (HCC) -Eliquis   Hypothyroidism -Synthroid resumed   Tobacco use -encourage cessation -Declines nicotine patch         DVT prophylaxis:  apixaban (ELIQUIS) tablet 5 mg    Code Status: Limited: Do not attempt resuscitation (DNR) -DNR-LIMITED -Do Not Intubate/DNI    Disposition Plan:  Level of care: Med-Surg      Consultants:  none  Subjective: C/o SOB and cough still  Objective: Vitals:   12/15/23 1403 12/15/23  2114 12/16/23 0510 12/16/23 0823  BP: 115/75 114/64 112/76   Pulse: 72 76 69   Resp: 16 18 18    Temp: 98 F (36.7 C) 98.3 F (36.8 C) 98 F (36.7 C)   TempSrc: Oral Oral Oral   SpO2: 96% 99% 98% 98%  Weight:      Height:       No intake or output data in the 24 hours ending 12/16/23 1150  Filed Weights   12/12/23 2236 12/13/23 0546  Weight: 45.4 kg 44.1 kg    Examination:   General: Appearance:    Thin female in no acute distress     Lungs:     On O2,  wheezing thoughout  Heart:    Normal heart rate.    MS:   All extremities are intact.    Neurologic:   Awake, alert, irritable       Data Reviewed: I have personally reviewed following labs and imaging studies  CBC: Recent Labs  Lab 12/09/23 1219 12/12/23 2252 12/13/23 0616 12/16/23 0556  WBC 9.7 12.8* 8.2 11.4*  NEUTROABS 7.8*  --  7.5  --   HGB 10.9* 12.7 13.0 12.2  HCT 36.2 41.1 43.5 41.2  MCV 96.8 94.7 96.5 96.0  PLT 227 259 275 253   Basic Metabolic Panel: Recent Labs  Lab 12/09/23 1341 12/12/23 2252 12/13/23 0616 12/16/23 0556  NA 138 142 138 135  K 3.6 3.2* 4.2 3.9  CL 99 103 97* 98  CO2 31 30 32 30  GLUCOSE 145* 114* 145* 203*  BUN 14 11 16  26*  CREATININE  0.47 0.47 0.57 0.74  CALCIUM 8.4* 8.0* 8.8* 8.6*   GFR: Estimated Creatinine Clearance: 48.8 mL/min (by C-G formula based on SCr of 0.74 mg/dL). Liver Function Tests: No results for input(s): "AST", "ALT", "ALKPHOS", "BILITOT", "PROT", "ALBUMIN" in the last 168 hours. No results for input(s): "LIPASE", "AMYLASE" in the last 168 hours. No results for input(s): "AMMONIA" in the last 168 hours. Coagulation Profile: No results for input(s): "INR", "PROTIME" in the last 168 hours. Cardiac Enzymes: No results for input(s): "CKTOTAL", "CKMB", "CKMBINDEX", "TROPONINI" in the last 168 hours. BNP (last 3 results) No results for input(s): "PROBNP" in the last 8760 hours. HbA1C: No results for input(s): "HGBA1C" in the last 72  hours. CBG: No results for input(s): "GLUCAP" in the last 168 hours. Lipid Profile: No results for input(s): "CHOL", "HDL", "LDLCALC", "TRIG", "CHOLHDL", "LDLDIRECT" in the last 72 hours. Thyroid Function Tests: No results for input(s): "TSH", "T4TOTAL", "FREET4", "T3FREE", "THYROIDAB" in the last 72 hours. Anemia Panel: No results for input(s): "VITAMINB12", "FOLATE", "FERRITIN", "TIBC", "IRON", "RETICCTPCT" in the last 72 hours. Sepsis Labs: No results for input(s): "PROCALCITON", "LATICACIDVEN" in the last 168 hours.  Recent Results (from the past 240 hours)  SARS Coronavirus 2 by RT PCR (hospital order, performed in [facility] hospital lab) *cepheid single result test* Anterior Nasal Swab     Status: None   Collection Time: 12/12/23 10:52 PM   Specimen: Anterior Nasal Swab  Result Value Ref Range Status   SARS Coronavirus 2 by RT PCR NEGATIVE NEGATIVE Final    Comment: (NOTE) SARS-CoV-2 target nucleic acids are NOT DETECTED.  The SARS-CoV-2 RNA is generally detectable in upper and lower respiratory specimens during the acute phase of infection. The lowest concentration of SARS-CoV-2 viral copies this assay can detect is 250 copies / mL. A negative result does not preclude SARS-CoV-2 infection and should not be used as the sole basis for treatment or other patient management decisions.  A negative result may occur with improper specimen collection / handling, submission of specimen other than nasopharyngeal swab, presence of viral mutation(s) within the areas targeted by this assay, and inadequate number of viral copies (<250 copies / mL). A negative result must be combined with clinical observations, patient history, and epidemiological information.  Fact Sheet for Patients:   RoadLapTop.co.za  Fact Sheet for Healthcare Providers: http://kim-miller.com/  This test is not yet approved or  cleared by the [place] FDA and has  been authorized for detection and/or diagnosis of SARS-CoV-2 by FDA under an Emergency Use Authorization (EUA).  This EUA will remain in effect (meaning this test can be used) for the duration of the COVID-19 declaration under Section 564(b)(1) of the Act, 21 U.S.C. section 360bbb-3(b)(1), unless the authorization is terminated or revoked sooner.  Performed at [facility], [address] [place], [place] [address]          Radiology Studies: No results found.       Scheduled Meds:  albuterol  2.5 mg Nebulization BID   amoxicillin-clavulanate  1 tablet Oral Q12H   apixaban  5 mg Oral BID   feeding supplement  237 mL Oral TID BM   gatifloxacin  1 drop Left Eye QID   levothyroxine  125 mcg Oral Q0600   magnesium oxide  400 mg Oral Daily   methylPREDNISolone (SOLU-MEDROL) injection  40 mg Intravenous Q12H   metoprolol succinate  12.5 mg Oral Daily   mometasone-formoterol  2 puff Inhalation BID   multivitamin with minerals  1 tablet Oral Daily   oxyCODONE  5 mg Oral Once   polyvinyl alcohol  1 drop Both Eyes QID   prednisoLONE acetate  1 drop Left Eye QID   rosuvastatin  20 mg Oral Daily   sertraline  25 mg Oral Daily   umeclidinium bromide  1 puff Inhalation Daily   Continuous Infusions:   LOS: 2 days    Time spent: 45 minutes spent on chart review, discussion with nursing staff, consultants, updating family and interview/physical exam; more than 50% of that time was spent in counseling and/or coordination of care.    Joseph Art, DO [facility] Available via Epic secure chat 7am-7pm After these hours, please refer to coverage provider listed on [email or web address] 12/16/2023, 11:50 AM

## 2023-12-16 NOTE — Plan of Care (Signed)
  Problem: Education: Goal: Knowledge of General Education information will improve Description: Including pain rating scale, medication(s)/side effects and non-pharmacologic comfort measures Outcome: Progressing   Problem: Health Behavior/Discharge Planning: Goal: Ability to manage health-related needs will improve Outcome: Progressing   Problem: Clinical Measurements: Goal: Ability to maintain clinical measurements within normal limits will improve Outcome: Progressing Goal: Will remain free from infection Outcome: Progressing Goal: Diagnostic test results will improve Outcome: Progressing Goal: Respiratory complications will improve Outcome: Progressing Goal: Cardiovascular complication will be avoided Outcome: Progressing   Problem: Activity: Goal: Risk for activity intolerance will decrease Outcome: Progressing   Problem: Nutrition: Goal: Adequate nutrition will be maintained Outcome: Progressing   Problem: Coping: Goal: Level of anxiety will decrease Outcome: Progressing   Problem: Elimination: Goal: Will not experience complications related to bowel motility Outcome: Progressing Goal: Will not experience complications related to urinary retention Outcome: Progressing   Problem: Pain Managment: Goal: General experience of comfort will improve and/or be controlled Outcome: Progressing   Problem: Safety: Goal: Ability to remain free from injury will improve Outcome: Progressing   Problem: Skin Integrity: Goal: Risk for impaired skin integrity will decrease Outcome: Progressing   Problem: Education: Goal: Knowledge of disease or condition will improve Outcome: Progressing Goal: Knowledge of the prescribed therapeutic regimen will improve Outcome: Progressing   Problem: Activity: Goal: Ability to tolerate increased activity will improve Outcome: Progressing Goal: Will verbalize the importance of balancing activity with adequate rest periods Outcome:  Progressing   Problem: Respiratory: Goal: Ability to maintain a clear airway will improve Outcome: Progressing Goal: Levels of oxygenation will improve Outcome: Progressing Goal: Ability to maintain adequate ventilation will improve Outcome: Progressing

## 2023-12-16 NOTE — Progress Notes (Signed)
 Physical Therapy Treatment Patient Details Name: [name] MRN: [id number] DOB: 1956/07/03 Today's Date: 12/16/2023   History of Present Illness Tracy Park is a 68 y.o. female who presents to the ER on 12/12/23 with complaints of dyspnea with activity.  Per patient she has chronic shortness of breath however over the past 2 days has gotten significantly worse.  Patient endorses wheezing, cough, shortness of breath, chest tightness with ambulation.  She denies fevers or chills.  Patient states she is only able to walk 3-4 steps without becoming extremely short of breath.  Pt recently came to the ER. admitted 11/01/23 with SOB and wheezing as well. Dx with acute COPD exacerbation.  PMH includes stage 3 lung cancer s/p left upper lobe resection greater than 5 years ago (in remission), COPD, chronic hypoxic respiratory failure on 2 L [place] at home, anxiety and depression, paroxysmal atrial fibrillation on Eliquis, hypothyroidism, ongoing tobacco use    PT Comments  AxO x 3 pleasant and willing.  Eager to be able to go home. Pt has been amb herself to and from bathroom as needed with no assistance.  Extra long O2 tubing in place. Assisted with amb in hallway.  General transfer comment: good safety cognition and use of hands to steady self  General Gait Details: intermittent assist as pt declined need for any AD.  Amb 165 feet on 2 lts with sats avg 94% and HR 99.  Dyspnea 2/4.  Required one extended standing rest break half way thru. Tolerated session well. Pt plans to return home when medically cleared.    If plan is discharge home, recommend the following: A little help with walking and/or transfers;A little help with bathing/dressing/bathroom;Assistance with cooking/housework;Assist for transportation;Help with stairs or ramp for entrance   Can travel by private vehicle        Equipment Recommendations  None recommended by PT    Recommendations for Other Services       Precautions /  Restrictions Precautions Precautions: Fall Precaution/Restrictions Comments: home oxygen 2 lts Restrictions Weight Bearing Restrictions Per Provider Order: No     Mobility  Bed Mobility Overal bed mobility: Modified Independent             General bed mobility comments: self able    Transfers Overall transfer level: Needs assistance Equipment used: None Transfers: Sit to/from Stand Sit to Stand: Supervision           General transfer comment: good safety cognition and use of hands to steady self    Ambulation/Gait Ambulation/Gait assistance: Supervision, Contact guard assist Gait Distance (Feet): 165 Feet Assistive device: None Gait Pattern/deviations: Step-through pattern, Decreased step length - right, Decreased step length - left, Shuffle, Narrow base of support, Staggering left, Staggering right Gait velocity: decr     General Gait Details: intermittent assist as pt declined need for any AD.  Amb 165 feet on 2 lts with sats avg 94% and HR 99.  Dyspnea 2/4.  Required one extended standing rest break half way thru.   Stairs             Wheelchair Mobility     Tilt Bed    Modified Rankin (Stroke Patients Only)       Balance                                            Communication  Communication Communication: No apparent difficulties  Cognition Arousal: Alert Behavior During Therapy: WFL for tasks assessed/performed   PT - Cognitive impairments: No apparent impairments                       PT - Cognition Comments: AxO x 3 pleasant and willing.  Eager to be able to go home. Following commands: Intact      Cueing    Exercises      General Comments        Pertinent Vitals/Pain Pain Assessment Pain Assessment: No/denies pain    Home Living                          Prior Function            PT Goals (current goals can now be found in the care plan section) Progress towards PT goals:  Progressing toward goals    Frequency    Min 3X/week      PT Plan      Co-evaluation              AM-PAC PT "6 Clicks" Mobility   Outcome Measure  Help needed turning from your back to your side while in a flat bed without using bedrails?: None Help needed moving from lying on your back to sitting on the side of a flat bed without using bedrails?: None Help needed moving to and from a bed to a chair (including a wheelchair)?: None Help needed standing up from a chair using your arms (e.g., wheelchair or bedside chair)?: None Help needed to walk in hospital room?: A Little Help needed climbing 3-5 steps with a railing? : A Little 6 Click Score: 22    End of Session Equipment Utilized During Treatment: Gait belt Activity Tolerance: Patient tolerated treatment well Patient left: in bed;with call bell/phone within reach;with bed alarm set Nurse Communication: Mobility status PT Visit Diagnosis: Unsteadiness on feet (R26.81);Muscle weakness (generalized) (M62.81)     Time: 1610-9604 PT Time Calculation (min) (ACUTE ONLY): 18 min  Charges:    $Gait Training: 8-22 mins PT General Charges $$ ACUTE PT VISIT: 1 Visit                     {Brett Soza  PTA Acute  Rehabilitation Services Office M-F          [phone]

## 2023-12-17 DIAGNOSIS — J441 Chronic obstructive pulmonary disease with (acute) exacerbation: Secondary | ICD-10-CM | POA: Diagnosis not present

## 2023-12-17 DIAGNOSIS — F17210 Nicotine dependence, cigarettes, uncomplicated: Secondary | ICD-10-CM | POA: Diagnosis not present

## 2023-12-17 DIAGNOSIS — Z803 Family history of malignant neoplasm of breast: Secondary | ICD-10-CM

## 2023-12-17 DIAGNOSIS — Z85118 Personal history of other malignant neoplasm of bronchus and lung: Secondary | ICD-10-CM | POA: Diagnosis not present

## 2023-12-17 DIAGNOSIS — R918 Other nonspecific abnormal finding of lung field: Secondary | ICD-10-CM | POA: Diagnosis not present

## 2023-12-17 DIAGNOSIS — Z800 Family history of malignant neoplasm of digestive organs: Secondary | ICD-10-CM

## 2023-12-17 DIAGNOSIS — Z9221 Personal history of antineoplastic chemotherapy: Secondary | ICD-10-CM

## 2023-12-17 DIAGNOSIS — E119 Type 2 diabetes mellitus without complications: Secondary | ICD-10-CM

## 2023-12-17 DIAGNOSIS — I110 Hypertensive heart disease with heart failure: Secondary | ICD-10-CM

## 2023-12-17 DIAGNOSIS — I509 Heart failure, unspecified: Secondary | ICD-10-CM

## 2023-12-17 NOTE — Progress Notes (Signed)
 Patient complaining of right sided chest pain radiating to arm. VSS, MD Idelle Leech and primary RN Hilton Cork notified. EKG showed NSR. No tele orders placed. Will continue to monitor and assess patient.

## 2023-12-17 NOTE — Progress Notes (Signed)
 PROGRESS NOTE    Tracy Park  ZDG:[id number] DOB: 08/10/1956 DOA: 12/12/2023 PCP: Loleta Dicker, FNP   Brief Narrative:  This 68 year old female with history of lung cancer stage III status post lobectomy in 2018, COPD, chronic respiratory failure on 2 L nasal cannula at baseline, bipolar disorder, paroxysmal atrial fibrillation, hypothyroidism.  She presents to the ER with complaints of dyspnea with activity.  Per patient she has chronic shortness of breath however over the past 2 days has gotten significantly worse.  Patient endorses wheezing, cough, shortness of breath, chest tightness with ambulation.  She denies fevers or chills.  Patient states she is only able to walk 3-4 steps without becoming extremely short of breath.  She came to the ER.  Slow to improve, poor overall prognosis.   Assessment & Plan:   Principal Problem:   Acute exacerbation of chronic obstructive pulmonary disease (COPD) (HCC) Active Problems:   Paroxysmal atrial fibrillation (HCC)   Hypothyroidism   Adenocarcinoma of lung (HCC)   Bipolar disorder (HCC)   Hyperlipidemia   COPD with acute exacerbation (HCC)   Chronic respiratory failure with hypoxia (HCC)  Acute on chronic hypoxic respiratory failure: Acute exacerbation of COPD. Continue home oxygen (wears 2L at home). Continue Tessalon Perles as needed. Flutter valve Continue IV Solu-Medrol, scheduled and as needed nebulizer. Continue Dulera, Incruse Ellipta ordered She does not seem to be getting better as fast as I expected. Continue IV steroids for now monitor another day   History of adenocarcinoma of lung [facility]) Oncology following.  Bipolar disorder (HCC)// PTSD Continue Zoloft.   Essential  HTN: Continue Cozaar, metoprolol.   HLD: Continue Crestor.   Paroxysmal atrial fibrillation (HCC) Continue Eliquis.   Hypothyroidism: Continue Synthroid.   Tobacco use: Encourage cessation -Declines nicotine patch.   DVT prophylaxis:  Eliquis Code Status: DNR Family Communication: No family at bed side. Disposition Plan:    Status is: Inpatient Remains inpatient appropriate because: Severity of  illness.    Consultants:  None  Procedures: None  Antimicrobials:  Anti-infectives (From admission, onward)    Start     Dose/Rate Route Frequency Ordered Stop   12/14/23 1000  amoxicillin-clavulanate (AUGMENTIN) 875-125 MG per tablet 1 tablet        1 tablet Oral Every 12 hours 12/14/23 0809        Subjective: Patient was seen and examined at bedside.  Overnight events noted. Patient still reports having shortness of breath,  still has significant amount of wheezing noted on exam.  Objective: Vitals:   12/17/23 0853 12/17/23 1115 12/17/23 1224 12/17/23 1328  BP:   127/76 116/80  Pulse:   77 75  Resp:   20 20  Temp:    98.4 F (36.9 C)  TempSrc:    Oral  SpO2: 96% (!) 88% 100% 98%  Weight:      Height:       No intake or output data in the 24 hours ending 12/17/23 1411 Filed Weights   12/12/23 2236 12/13/23 0546  Weight: 45.4 kg 44.1 kg    Examination:  General exam: Appears calm and comfortable , deconditioned, not in any acute distress. Respiratory system: Wheezing bilaterally. Respiratory effort normal. Cardiovascular system: S1 & S2 heard, RRR. No JVD, murmurs, rubs, gallops or clicks.  Gastrointestinal system: Abdomen is non distended, soft and non tender. Normal bowel sounds heard. Central nervous system: Alert and oriented X 3. No focal neurological deficits. Extremities: No edema, no cyanosis, no clubbing Skin: No rashes, lesions  or ulcers Psychiatry: Judgement and insight appear normal. Mood & affect appropriate.     Data Reviewed: I have personally reviewed following labs and imaging studies  CBC: Recent Labs  Lab 12/12/23 2252 12/13/23 0616 12/16/23 0556  WBC 12.8* 8.2 11.4*  NEUTROABS  --  7.5  --   HGB 12.7 13.0 12.2  HCT 41.1 43.5 41.2  MCV 94.7 96.5 96.0  PLT 259 275  253   Basic Metabolic Panel: Recent Labs  Lab 12/12/23 2252 12/13/23 0616 12/16/23 0556  NA 142 138 135  K 3.2* 4.2 3.9  CL 103 97* 98  CO2 30 32 30  GLUCOSE 114* 145* 203*  BUN 11 16 26*  CREATININE 0.47 0.57 0.74  CALCIUM 8.0* 8.8* 8.6*   GFR: Estimated Creatinine Clearance: 48.8 mL/min (by C-G formula based on SCr of 0.74 mg/dL). Liver Function Tests: No results for input(s): "AST", "ALT", "ALKPHOS", "BILITOT", "PROT", "ALBUMIN" in the last 168 hours. No results for input(s): "LIPASE", "AMYLASE" in the last 168 hours. No results for input(s): "AMMONIA" in the last 168 hours. Coagulation Profile: No results for input(s): "INR", "PROTIME" in the last 168 hours. Cardiac Enzymes: No results for input(s): "CKTOTAL", "CKMB", "CKMBINDEX", "TROPONINI" in the last 168 hours. BNP (last 3 results) No results for input(s): "PROBNP" in the last 8760 hours. HbA1C: No results for input(s): "HGBA1C" in the last 72 hours. CBG: No results for input(s): "GLUCAP" in the last 168 hours. Lipid Profile: No results for input(s): "CHOL", "HDL", "LDLCALC", "TRIG", "CHOLHDL", "LDLDIRECT" in the last 72 hours. Thyroid Function Tests: No results for input(s): "TSH", "T4TOTAL", "FREET4", "T3FREE", "THYROIDAB" in the last 72 hours. Anemia Panel: No results for input(s): "VITAMINB12", "FOLATE", "FERRITIN", "TIBC", "IRON", "RETICCTPCT" in the last 72 hours. Sepsis Labs: No results for input(s): "PROCALCITON", "LATICACIDVEN" in the last 168 hours.  Recent Results (from the past 240 hours)  SARS Coronavirus 2 by RT PCR (hospital order, performed in [facility] hospital lab) *cepheid single result test* Anterior Nasal Swab     Status: None   Collection Time: 12/12/23 10:52 PM   Specimen: Anterior Nasal Swab  Result Value Ref Range Status   SARS Coronavirus 2 by RT PCR NEGATIVE NEGATIVE Final    Comment: (NOTE) SARS-CoV-2 target nucleic acids are NOT DETECTED.  The SARS-CoV-2 RNA is generally  detectable in upper and lower respiratory specimens during the acute phase of infection. The lowest concentration of SARS-CoV-2 viral copies this assay can detect is 250 copies / mL. A negative result does not preclude SARS-CoV-2 infection and should not be used as the sole basis for treatment or other patient management decisions.  A negative result may occur with improper specimen collection / handling, submission of specimen other than nasopharyngeal swab, presence of viral mutation(s) within the areas targeted by this assay, and inadequate number of viral copies (<250 copies / mL). A negative result must be combined with clinical observations, patient history, and epidemiological information.  Fact Sheet for Patients:   RoadLapTop.co.za  Fact Sheet for Healthcare Providers: http://kim-miller.com/  This test is not yet approved or  cleared by the [place] FDA and has been authorized for detection and/or diagnosis of SARS-CoV-2 by FDA under an Emergency Use Authorization (EUA).  This EUA will remain in effect (meaning this test can be used) for the duration of the COVID-19 declaration under Section 564(b)(1) of the Act, 21 U.S.C. section 360bbb-3(b)(1), unless the authorization is terminated or revoked sooner.  Performed at [facility], [address] [place], [place] [address]     Radiology Studies: No results found.  Scheduled Meds:  albuterol  2.5 mg Nebulization BID   amoxicillin-clavulanate  1 tablet Oral Q12H   apixaban  5 mg Oral BID   feeding supplement  237 mL Oral TID BM   gatifloxacin  1 drop Left Eye QID   levothyroxine  125 mcg Oral Q0600   magnesium oxide  400 mg Oral Daily   methylPREDNISolone (SOLU-MEDROL) injection  40 mg Intravenous Q12H   metoprolol succinate  12.5 mg Oral Daily   mometasone-formoterol  2 puff Inhalation BID   multivitamin with minerals  1 tablet Oral Daily    oxyCODONE  5 mg Oral Once   polyvinyl alcohol  1 drop Both Eyes QID   prednisoLONE acetate  1 drop Left Eye QID   rosuvastatin  20 mg Oral Daily   sertraline  25 mg Oral Daily   umeclidinium bromide  1 puff Inhalation Daily   Continuous Infusions:   LOS: 3 days    Time spent: 50 mins    Willeen Niece, MD [facility]   If 7PM-7AM, please contact night-coverage

## 2023-12-17 NOTE — Consult Note (Addendum)
 [facility] CONSULT NOTE  Patient Care Team: Loleta Dicker, FNP as PCP - General (Family Medicine) Thomasene Ripple, DO as PCP - Cardiology (Cardiology)  CHIEF COMPLAINTS/PURPOSE OF CONSULTATION:  History of LUL adenocarcinoma.    REFERRING PHYSICIAN: Dr. Idelle Leech  HISTORY OF PRESENTING ILLNESS:  [place]acy Park 68 y.o. female who came to the ED on 12/13/2023 complaining of shortness of breath and dyspnea on exertion.  Admitted to wheezing, cough, and chest tightness with ambulation.  Stated that she was unable to walk without being extremely short of breath. Patient has had multiple ED visits for same.  She does have a history of lung adenocarcinoma treated at [facility] in 2018. Workup was done in the ED including labs and imaging.  Patient is seen awake alert and oriented x 3 today.  She is frail-appearing however she is a very pleasant lady and is a good historian. Medical history significant for lung cancer as stated above.  She is status post surgery and chemotherapy at [facility] in 2018.  Also medical history significant for COPD, hypertension, diabetes, hyperlipidemia. Surgical history includes thyroidectomy, hysterectomy, back surgery and surgery on her right eye as a child.  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 Family history significant for multiple family members with cancer: Brother with colon cancer in remission, sister with esophageal cancer, mother with breast cancer, patient's daughter with breast cancer, great grandparents with unknown cancers. Social history significant for tobacco use since age 67 years old to current, smokes 1 pack every 2 weeks (3 cigarettes/day), denies alcohol use, denies drug use although admits to marijuana use as a teenager.  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  Currently lives with a relative.    I have reviewed her chart and materials related to her cancer extensively and collaborated history  with the patient. Summary of oncologic history is as follows: Oncology History   No history exists.    ASSESSMENT & PLAN:  History of left upper lobe adenocarcinoma RUL consolidation? - Status post surgery and chemotherapy at [facility] in 2018.  Has been lost to follow-up.  States she would like to transfer all her care here. - CT angio chest done 12/13/2023 showed evidence of prior left upper lobectomy. - CT done 11/25/2023 shows increasing subpleural consolidation within the right upper lobe measuring up to 2.1 x 0.9 cm.  Given history of lung cancer, further evaluation with PET was recommended by [facility] health.  No evidence of being done at this time. -Chest x-ray done 12/12/2023 shows right lung grossly clear. - Patient had outpatient oncology appointment 12/10/2023 with [facility] outpatient Oncology which she missed. - Medical oncology/Dr. Arbutus Ped following  COPD Shortness of breath - Patient has had multiple ED visits for shortness of breath and difficulty on exertion - Follows with pulmonary at [facility] health, last seen pulmonary outpatient office 12/05/2023 -Continue respiratory treatments and supportive care as ordered - Recommend pulmonary eval  Tobacco use disorder - Encourage smoking cessation - It is noted patient declined nicotine patch  Diabetes Hypertension Hyperlipidemia - Continue to monitor blood pressure levels and administer hypertensives as ordered - Continue to monitor blood sugar levels - Continue statins as ordered - Medicine following     MEDICAL HISTORY:  Past Medical History:  Diagnosis Date   Anginal pain (HCC)    Anxiety    Bipolar disorder (HCC)    Cancer (HCC)    COPD (chronic obstructive pulmonary disease) (  HCC)    Dyspnea    Family history of adverse reaction to anesthesia    History of kidney stones    Hydroureteronephrosis 08/16/2021   Hypothyroidism    Lung cancer (HCC)    Myocardial infarction (HCC)    Paroxysmal atrial fibrillation (HCC)     PTSD (post-traumatic stress disorder)    Sleep apnea    Thyroid disease     SURGICAL HISTORY: Past Surgical History:  Procedure Laterality Date   ABDOMINAL HYSTERECTOMY     BACK SURGERY     CYSTOSCOPY W/ URETERAL STENT PLACEMENT Right 05/03/2021   Procedure: CYSTOSCOPY WITH RETROGRADE PYELOGRAM/URETERAL STENT PLACEMENT;  Surgeon: Crist Fat, MD;  Location: [facility] ORS;  Service: Urology;  Laterality: Right;   EYE SURGERY     kidney stent     thyroidectomy      SOCIAL HISTORY: Social History   Socioeconomic History   Marital status: Single    Spouse name: Not on file   Number of children: Not on file   Years of education: Not on file   Highest education level: Not on file  Occupational History   Not on file  Tobacco Use   Smoking status: Every Day    Current packs/day: 0.15    Types: Cigarettes   Smokeless tobacco: Never  Vaping Use   Vaping status: Never Used  Substance and Sexual Activity   Alcohol use: Not Currently   Drug use: Not Currently   Sexual activity: Not on file  Other Topics Concern   Not on file  Social History Narrative   Lives with daughter   Social Drivers of Health   Financial Resource Strain: High Risk (10/08/2023)   Received from [facility]   Overall Financial Resource Strain (CARDIA)    Difficulty of Paying Living Expenses: Very hard  Food Insecurity: No Food Insecurity (12/13/2023)   Hunger Vital Sign    Worried About Running Out of Food in the Last Year: Never true    Ran Out of Food in the Last Year: Never true  Recent Concern: Food Insecurity - Food Insecurity Present (10/08/2023)   Received from [facility]   Hunger Vital Sign    Worried About Running Out of Food in the Last Year: Sometimes true    Ran Out of Food in the Last Year: Sometimes true  Transportation Needs: No Transportation Needs (12/13/2023)   PRAPARE - Administrator, Civil Service (Medical): No    Lack of Transportation (Non-Medical): No  Recent  Concern: Transportation Needs - Unmet Transportation Needs (10/08/2023)   Received from [facility] - Transportation    Lack of Transportation (Medical): Yes    Lack of Transportation (Non-Medical): Yes  Physical Activity: Unknown (04/07/2023)   Received from [facility]   Exercise Vital Sign    Days of Exercise per Week: 0 days    Minutes of Exercise per Session: Not on file  Stress: Stress Concern Present (04/07/2023)   Received from [facility] - Occupational Stress Questionnaire    Feeling of Stress : To some extent  Social Connections: Moderately Isolated (12/13/2023)   Social Connection and Isolation Panel [NHANES]    Frequency of Communication with Friends and Family: More than three times a week    Frequency of Social Gatherings with Friends and Family: Once a week    Attends Religious Services: Never    Database administrator or Organizations: Yes  Attends [employer] Meetings: Never    Marital Status: Divorced  Catering manager Violence: Not At Risk (12/13/2023)   Humiliation, Afraid, Rape, and Kick questionnaire    Fear of Current or Ex-Partner: No    Emotionally Abused: No    Physically Abused: No    Sexually Abused: No    FAMILY HISTORY: Family History  Problem Relation Age of Onset   Hypertension Mother    Heart failure Mother    Hypertension Father    Diabetes Father    Heart failure Father     REVIEW OF SYSTEMS:   Constitutional: Denies fevers, chills or abnormal night sweats Eyes: Denies blurriness of vision, double vision or watery eyes Ears, nose, mouth, throat, and face: Denies mucositis or sore throat Respiratory: + SOB + DOE Cardiovascular: Denies palpitation, chest discomfort or lower extremity swelling Gastrointestinal: Denies nausea, heartburn or change in bowel habits Skin: Denies abnormal skin rashes Lymphatics: Denies new lymphadenopathy or easy bruising Neurological: Denies  numbness, tingling or new weaknesses Behavioral/Psych: Mood is stable, no new changes  All other systems were reviewed with the patient and are negative.  PHYSICAL EXAMINATION: ECOG PERFORMANCE STATUS: 3 - Symptomatic, >50% confined to bed  Vitals:   12/17/23 0850 12/17/23 0853  BP:    Pulse:    Resp:    Temp:    SpO2: 96% 96%   Filed Weights   12/12/23 2236 12/13/23 0546  Weight: 100 lb (45.4 kg) 97 lb 3.6 oz (44.1 kg)    GENERAL: alert, + frail + chronically ill-appearing SKIN: + Pale skin color, texture, turgor are normal, no rashes or significant lesions EYES: normal, conjunctiva are pink and non-injected, sclera clear OROPHARYNX: no exudate, no erythema and lips, buccal mucosa, and tongue normal  NECK: supple, thyroid normal size, non-tender, without nodularity LYMPH: no palpable lymphadenopathy in the cervical, axillary or inguinal LUNGS: + Bilateral wheezing to auscultation HEART: regular rate & rhythm and no murmurs and no lower extremity edema ABDOMEN: abdomen soft, non-tender and normal bowel sounds MUSCULOSKELETAL: no cyanosis of digits and no clubbing  PSYCH: alert & oriented x 3 with fluent speech NEURO: no focal motor/sensory deficits   ALLERGIES:  is allergic to red dye #40 (allura red), strawberry extract, tomato, aspirin, tape, and wound dressing adhesive.  MEDICATIONS:  Current Facility-Administered Medications  Medication Dose Route Frequency Provider Last Rate Last Admin   acetaminophen (TYLENOL) tablet 650 mg  650 mg Oral Q6H PRN Crosley, Debby, MD       Or   acetaminophen (TYLENOL) suppository 650 mg  650 mg Rectal Q6H PRN Crosley, Debby, MD       albuterol (PROVENTIL) (2.5 MG/3ML) 0.083% nebulizer solution 2.5 mg  2.5 mg Nebulization Q2H PRN Crosley, Debby, MD       albuterol (PROVENTIL) (2.5 MG/3ML) 0.083% nebulizer solution 2.5 mg  2.5 mg Nebulization BID Vann, Jessica U, DO   2.5 mg at 12/17/23 0850   amoxicillin-clavulanate (AUGMENTIN) 875-125 MG  per tablet 1 tablet  1 tablet Oral Q12H Vann, Jessica U, DO   1 tablet at 12/17/23 0931   apixaban (ELIQUIS) tablet 5 mg  5 mg Oral BID Crosley, Debby, MD   5 mg at 12/17/23 0932   benzonatate (TESSALON) capsule 100 mg  100 mg Oral TID PRN Gery Pray, MD   100 mg at 12/15/23 0626   feeding supplement (ENSURE ENLIVE / ENSURE PLUS) liquid 237 mL  237 mL Oral TID BM Vann, Jessica U, DO   237 mL  at 12/17/23 0932   gatifloxacin (ZYMAXID) 0.5 % ophthalmic drops 1 drop  1 drop Left Eye QID Gery Pray, MD   1 drop at 12/17/23 0934   levothyroxine (SYNTHROID) tablet 125 mcg  125 mcg Oral Q0600 Gery Pray, MD   125 mcg at 12/17/23 0537   magnesium oxide (MAG-OX) tablet 400 mg  400 mg Oral Daily Gery Pray, MD   400 mg at 12/17/23 0933   methylPREDNISolone sodium succinate (SOLU-MEDROL) 40 mg/mL injection 40 mg  40 mg Intravenous Q12H Vann, Jessica U, DO   40 mg at 12/17/23 0957   metoprolol succinate (TOPROL-XL) 24 hr tablet 12.5 mg  12.5 mg Oral Daily Vann, Jessica U, DO   12.5 mg at 12/17/23 0932   mometasone-formoterol (DULERA) 100-5 MCG/ACT inhaler 2 puff  2 puff Inhalation BID Gery Pray, MD   2 puff at 12/17/23 0853   multivitamin with minerals tablet 1 tablet  1 tablet Oral Daily Marlin Canary U, DO   1 tablet at 12/17/23 1610   oxyCODONE (Oxy IR/ROXICODONE) immediate release tablet 5 mg  5 mg Oral Once Sherian Maroon A, PA       oxyCODONE (Oxy IR/ROXICODONE) immediate release tablet 5 mg  5 mg Oral Q4H PRN Marlin Canary U, DO   5 mg at 12/17/23 0930   polyvinyl alcohol (LIQUIFILM TEARS) 1.4 % ophthalmic solution 1 drop  1 drop Both Eyes QID Gery Pray, MD   1 drop at 12/17/23 0934   prednisoLONE acetate (PRED FORTE) 1 % ophthalmic suspension 1 drop  1 drop Left Eye QID Gery Pray, MD   1 drop at 12/17/23 0935   rosuvastatin (CRESTOR) tablet 20 mg  20 mg Oral Daily Gery Pray, MD   20 mg at 12/17/23 0933   sertraline (ZOLOFT) tablet 25 mg  25 mg Oral Daily Gery Pray, MD   25 mg at 12/17/23 0933   umeclidinium bromide (INCRUSE ELLIPTA) 62.5 MCG/ACT 1 puff  1 puff Inhalation Daily Gery Pray, MD   1 puff at 12/17/23 0853     LABORATORY DATA:  I have reviewed the data as listed Lab Results  Component Value Date   WBC 11.4 (H) 12/16/2023   HGB 12.2 12/16/2023   HCT 41.2 12/16/2023   MCV 96.0 12/16/2023   PLT 253 12/16/2023   Recent Labs    10/21/23 0110 11/01/23 1223 11/25/23 1819 12/03/23 1525 12/09/23 1341 12/12/23 2252 12/13/23 0616 12/16/23 0556  NA 140   < > 139 142   < > 142 138 135  K 4.0   < > 3.9 3.5   < > 3.2* 4.2 3.9  CL 99   < > 98 98   < > 103 97* 98  CO2 33*   < > 32 35*   < > 30 32 30  GLUCOSE 155*   < > 85 107*   < > 114* 145* 203*  BUN 18   < > 16 15   < > 11 16 26*  CREATININE 0.41*   < > 0.64 0.53   < > 0.47 0.57 0.74  CALCIUM 8.3*   < > 8.9 9.3   < > 8.0* 8.8* 8.6*  GFRNONAA >60   < > >60 >60   < > >60 >60 >60  PROT 6.1*  --  7.1 7.0  --   --   --   --   ALBUMIN 3.1*  --  3.8 3.7  --   --   --   --  AST 15  --  15 15  --   --   --   --   ALT 14  --  14 11  --   --   --   --   ALKPHOS 62  --  67 70  --   --   --   --   BILITOT 0.2  --  0.7 0.2  --   --   --   --    < > = values in this interval not displayed.    RADIOGRAPHIC STUDIES: I have personally reviewed the radiological images as listed and agreed with the findings in the report. CT Angio Chest PE W/Cm &/Or Wo Cm Result Date: 12/13/2023 CLINICAL DATA:  Suspected pulmonary embolism. EXAM: CT ANGIOGRAPHY CHEST WITH CONTRAST TECHNIQUE: Multidetector CT imaging of the chest was performed using the standard protocol during bolus administration of intravenous contrast. Multiplanar CT image reconstructions and MIPs were obtained to evaluate the vascular anatomy. RADIATION DOSE REDUCTION: This exam was performed according to the departmental dose-optimization program which includes automated exposure control, adjustment of the mA and/or kV according to  patient size and/or use of iterative reconstruction technique. CONTRAST:  80mL OMNIPAQUE IOHEXOL 350 MG/ML SOLN COMPARISON:  November 25, 2023 FINDINGS: Cardiovascular: There is marked severity calcification of the aortic arch, without evidence of aortic aneurysm. Satisfactory opacification of the pulmonary arteries to the segmental level. No evidence of pulmonary embolism. Normal heart size with mild coronary artery calcification. No pericardial effusion. Mediastinum/Nodes: No enlarged mediastinal, hilar, or axillary lymph nodes. Thyroid gland, trachea, and esophagus demonstrate no significant findings. Lungs/Pleura: There is evidence of prior left upper lobectomy with subsequent left-sided volume loss. Emphysematous lung disease is also seen with bullous changes and chronic scarring seen within the superior segment of the left lower lobe. Mild, stable subpleural consolidation is seen along the posterior aspect of the right upper lobe. Mild posterior left lower lobe and posterior right basilar linear atelectasis is seen. No pleural effusion or pneumothorax is identified. Upper Abdomen: No acute abnormality. Musculoskeletal: Postoperative changes are seen within the visualized portion of the lower cervical spine. No acute osseous abnormalities are identified. Review of the MIP images confirms the above findings. IMPRESSION: 1. No evidence of pulmonary embolism. 2. Evidence of prior left upper lobectomy with subsequent left-sided volume loss. 3. Emphysematous lung disease with bullous changes and chronic scarring within the superior segment of the left lower lobe. 4. Mild, stable subpleural consolidation along the posterior aspect of the right upper lobe. 5. Mild posterior left lower lobe and posterior right basilar linear atelectasis. 6. Aortic atherosclerosis. Aortic Atherosclerosis (ICD10-I70.0) and Emphysema (ICD10-J43.9). Electronically Signed   By: Aram Candela M.D.   On: 12/13/2023 03:19   DG Chest 2  View Result Date: 12/12/2023 CLINICAL DATA:  Chest pain EXAM: CHEST - 2 VIEW COMPARISON:  12/09/2023, 08/06/2023 FINDINGS: Right lung is grossly clear. Chronic volume loss and postoperative change in the left thorax prick no acute airspace disease, pleural effusion or pneumothorax. Emphysema. IMPRESSION: No active cardiopulmonary disease. Emphysema. Electronically Signed   By: Jasmine Pang M.D.   On: 12/12/2023 23:33   DG Chest 2 View Result Date: 12/09/2023 CLINICAL DATA:  Shortness of breath. EXAM: CHEST - 2 VIEW COMPARISON:  12/03/2023. FINDINGS: Redemonstration of opacity overlying the left upper lung zone which is essentially similar to prior studies and compatible with provided history of lung malignancy, status post treatment. Bilateral lungs otherwise appear hyperexpanded and hyperlucent with coarse bronchovascular markings, in  keeping with COPD. No new dense consolidation or lung collapse. Bilateral costophrenic angles are clear. Normal cardio-mediastinal silhouette. No acute osseous abnormalities. The soft tissues are within normal limits. IMPRESSION: No active cardiopulmonary disease. COPD. Electronically Signed   By: Jules Schick M.D.   On: 12/09/2023 15:01   DG Chest 2 View Result Date: 12/03/2023 CLINICAL DATA:  Shortness of breath EXAM: CHEST - 2 VIEW COMPARISON:  11/25/2023 chest x-ray and CT, 06/24/2023 FINDINGS: Chronic postsurgical changes and pleuroparenchymal scarring at left apex. Vague right upper lobe pulmonary opacity. Stable cardiomediastinal silhouette. No pneumothorax IMPRESSION: 1. Vague right upper lobe pulmonary opacity, possible pneumonia. Imaging follow-up to resolution is recommended 2. Chronic postsurgical changes and pleuroparenchymal scarring at left apex. Electronically Signed   By: Jasmine Pang M.D.   On: 12/03/2023 16:54   CT Angio Chest PE W and/or Wo Contrast Result Date: 11/25/2023 CLINICAL DATA:  Short of breath, left chest pain, history of lung cancer EXAM: CT  ANGIOGRAPHY CHEST WITH CONTRAST TECHNIQUE: Multidetector CT imaging of the chest was performed using the standard protocol during bolus administration of intravenous contrast. Multiplanar CT image reconstructions and MIPs were obtained to evaluate the vascular anatomy. RADIATION DOSE REDUCTION: This exam was performed according to the departmental dose-optimization program which includes automated exposure control, adjustment of the mA and/or kV according to patient size and/or use of iterative reconstruction technique. CONTRAST:  75mL OMNIPAQUE IOHEXOL 350 MG/ML SOLN COMPARISON:  11/25/2023, 08/03/2023, 03/30/2022 FINDINGS: Cardiovascular: This is a technically adequate evaluation of the pulmonary vasculature. No filling defects or pulmonary emboli. The heart is unremarkable without pericardial effusion. No evidence of thoracic aortic aneurysm or dissection. Atherosclerosis of the aorta and coronary vasculature. Mediastinum/Nodes: No enlarged mediastinal, hilar, or axillary lymph nodes. Thyroid gland, trachea, and esophagus demonstrate no significant findings. Lungs/Pleura: Prior left upper lobectomy with associated left-sided volume loss. Bullous changes and chronic scarring are again seen within the superior segment of the left lower lobe. Stable emphysema. There is increasing subpleural consolidation within the right upper lobe abutting the major fissure, measuring 2.1 x 0.9 cm reference image 34/4. No acute airspace disease, effusion, or pneumothorax. Central airways are patent. Upper Abdomen: No acute abnormality. Musculoskeletal: No acute or destructive bony abnormalities. Chronic mild compression deformity superior endplate of the T11 vertebral body. Reconstructed images demonstrate no additional findings. Review of the MIP images confirms the above findings. IMPRESSION: 1. No evidence of pulmonary embolus. 2. Chronic postsurgical changes from left upper lobectomy, with cystic changes and scarring within the  superior segment of the left lower lobe. 3. Increasing subpleural consolidation within the right upper lobe abutting the major fissure, measuring up to 2.1 x 0.9 cm. Given the patient's history of lung cancer and the progression since prior exams, further evaluation with PET scan may be useful. 4. Aortic Atherosclerosis (ICD10-I70.0) and Emphysema (ICD10-J43.9). Electronically Signed   By: Sharlet Salina M.D.   On: 11/25/2023 22:39   DG Chest 2 View Result Date: 11/25/2023 CLINICAL DATA:  Shortness of breath.  History of lung cancer. EXAM: CHEST - 2 VIEW COMPARISON:  November 01, 2023. FINDINGS: The heart size and mediastinal contours are within normal limits. Stable scarring seen in left upper lobe consistent with surgery. Emphysematous disease is again noted. No definite acute abnormality seen. The visualized skeletal structures are unremarkable. IMPRESSION: Chronic findings as noted above. No definite acute abnormality noted. Electronically Signed   By: Lupita Raider M.D.   On: 11/25/2023 20:01     The total time  spent in the appointment was 55 minutes encounter with patients including review of chart and various tests results, discussions about plan of care and coordination of care plan   All questions were answered. The patient knows to call the clinic with any problems, questions or concerns. No barriers to learning was detected.  Dawson Bills, NP 4/9/202510:27 AM   ADDENDUM: Hematology/Oncology Attending: The patient is a 68 year old with lung cancer who presents with shortness of breath. She was accompanied by her sister, who called an ambulance due to her severe shortness of breath.  She is currently hospitalized due to severe shortness of breath, which was unrelieved by her oxygen concentrator. Her sister called an ambulance when she could not breathe adequately. No hemoptysis, but her mucus is sometimes thick and grayish. No nausea or vomiting, but her stools are looser than  usual.  She has a history of lung cancer diagnosed in 2018. Stage IIIA (T1b, N2, M0) non-small cell lung cancer diagnosed in March 2018 status post left upper lobectomy with lymph node dissection followed by 4 cycles of adjuvant systemic chemotherapy with cisplatin and pemetrexed completed in November 2018.  Molecular studies at that time showed no actionable mutations with negative EGFR, negative BRAF, negative ALK, negative for ROS1 and PD-L1 expression of 70%.A CT scan revealed a stable density in the right upper lobe, and she has not had a PET scan in the last year. The density could represent scarring, inflammation, or malignancy.  She experiences right-sided chest pain and arm pain, for which an EKG was performed. No history of hypertension, diabetes, myocardial infarction, or cerebrovascular accident.  Her past medical history includes congestive heart failure, blindness in the right eye, C5-C6 disc replacement, and three right shoulder surgeries, including a replacement.  Her family history is significant for cancer, with her mother having had breast cancer, her brother colon cancer, her sister esophageal cancer, and her eldest sister skin cancer.  Socially, she is not married and has two adult daughters, one of whom is estranged. She worked in [employer] and has a significant smoking history of about 50 years, having started at age 37. She quit smoking upon her current hospital admission. She does not consume alcohol or use drugs, although she tried marijuana in the past.  Assessment and Plan: Lung cancer Stage IIIA (T1b, N2, M0) non-small cell lung cancer diagnosed in March 2018 status post left upper lobectomy with lymph node dissection followed by 4 cycles of adjuvant systemic chemotherapy with cisplatin and pemetrexed completed in November 2018.  Molecular studies at that time showed no actionable mutations with negative EGFR, negative BRAF, negative ALK, negative for ROS1 and  PD-L1 expression of 70%. Lung cancer diagnosed in 2018. Recent CT scan shows density in the right upper lobe, possibly scarring, inflammation, or malignancy. Plan for PET scan post-discharge to evaluate. Biopsy considered if PET scan shows suspicious activity. - Order PET scan after hospital discharge to evaluate right upper lobe density - Consider biopsy if PET scan shows suspicious activity - Schedule follow-up appointment at the cancer center after PET scan  Shortness of breath Acute shortness of breath likely due to COPD exacerbation. Reports thick, grayish mucus and wheezing. Right-sided chest pain with associated arm pain. No evidence of pneumonia or COVID-19. - Continue treatment for COPD exacerbation  Chronic obstructive pulmonary disease (COPD) COPD with recent exacerbation leading to hospitalization. Former smoker with a 50-year smoking history, recently quit upon hospital admission. - Continue COPD management and smoking  cessation support  Congestive heart failure Congestive heart failure with unknown ejection fraction. No acute decompensation during this encounter.  Disclaimer: This note was dictated with voice recognition software. Similar sounding words can inadvertently be transcribed and may be missed upon review. Lajuana Matte, MD

## 2023-12-17 NOTE — Progress Notes (Addendum)
 Mobility Specialist - Progress Note   12/17/23 1115  Oxygen Therapy  SpO2 (!) 88 %  O2 Device Nasal Cannula  O2 Flow Rate (L/min) 2 L/min  Patient Activity (if Appropriate) Ambulating  Mobility  Activity Ambulated independently in hallway  Level of Assistance Independent  Assistive Device None  Distance Ambulated (ft) 500 ft  Activity Response Tolerated well  Mobility Referral Yes  Mobility visit 1 Mobility  Mobility Specialist Start Time (ACUTE ONLY) 1059  Mobility Specialist Stop Time (ACUTE ONLY) 1113  Mobility Specialist Time Calculation (min) (ACUTE ONLY) 14 min   Pt received in bed and agreeable to mobility. During ambulation, pt desat to 88%. Encouraged pursed lip breaths bringing SpO2 to 92%. No complaints during session. Pt to bed after session with all needs met.      Pre-mobility:  94% SpO2 (2L [place]) During mobility:  88-92% SpO2 (2L [place]) Post-mobility: 93% SPO2 (2L [place])  Schuyler Amor Mobility Specialist   [facility] Mobility Specialist

## 2023-12-17 NOTE — Plan of Care (Signed)

## 2023-12-18 DIAGNOSIS — J441 Chronic obstructive pulmonary disease with (acute) exacerbation: Secondary | ICD-10-CM | POA: Diagnosis not present

## 2023-12-18 MED ORDER — ALBUTEROL SULFATE (2.5 MG/3ML) 0.083% IN NEBU
2.5000 mg | INHALATION_SOLUTION | Freq: Three times a day (TID) | RESPIRATORY_TRACT | Status: DC
  Administered 2023-12-18 – 2023-12-19 (×3): 2.5 mg via RESPIRATORY_TRACT
  Filled 2023-12-18 (×3): qty 3

## 2023-12-18 NOTE — Plan of Care (Signed)

## 2023-12-18 NOTE — Progress Notes (Signed)
 Mobility Specialist - Progress Note   12/18/23 1009  Oxygen Therapy  SpO2 97 %  O2 Device Nasal Cannula  O2 Flow Rate (L/min) 2 L/min  Patient Activity (if Appropriate) Ambulating  Mobility  Activity Ambulated independently in hallway  Level of Assistance Independent  Assistive Device None  Distance Ambulated (ft) 500 ft  Activity Response Tolerated well  Mobility Referral Yes  Mobility visit 1 Mobility  Mobility Specialist Start Time (ACUTE ONLY) [id number]  Mobility Specialist Stop Time (ACUTE ONLY) 1007  Mobility Specialist Time Calculation (min) (ACUTE ONLY) 19 min   Pt received in bed and agreeable to mobility. No complaints during session. Pt to bed after session with all needs met.    Pre-mobility: 93% SpO2 (2L [place]) During mobility: 97% SpO2 (2L [place]) Post-mobility: 95% SPO2 (2L [place])  Chief Technology Officer

## 2023-12-18 NOTE — Progress Notes (Signed)
 PROGRESS NOTE    Tracy Park  BJY:[id number] DOB: [birth year] DOA: 12/12/2023 PCP: Loleta Dicker, FNP   Brief Narrative:  This 68 year old female with history of lung cancer stage III status post lobectomy in 2018, COPD, chronic respiratory failure on 2 L nasal cannula at baseline, bipolar disorder, paroxysmal atrial fibrillation, hypothyroidism.  She presents to the ER with complaints of dyspnea with activity.  Per patient she has chronic shortness of breath however over the past 2 days has gotten significantly worse.  Patient endorses wheezing, cough, shortness of breath, chest tightness with ambulation.  She denies fevers or chills.  Patient states she is only able to walk 3-4 steps without becoming extremely short of breath.  She came to the ER.  Slow to improve, poor overall prognosis.   Assessment & Plan:   Principal Problem:   Acute exacerbation of chronic obstructive pulmonary disease (COPD) (HCC) Active Problems:   Paroxysmal atrial fibrillation (HCC)   Hypothyroidism   Adenocarcinoma of lung (HCC)   Bipolar disorder (HCC)   Hyperlipidemia   COPD with acute exacerbation (HCC)   Chronic respiratory failure with hypoxia (HCC)  Acute on chronic hypoxic respiratory failure: Acute exacerbation of COPD: Continue home oxygen (wears 2L at home). Continue Tessalon Perles as needed. Flutter valve Continue IV Solu-Medrol, scheduled and as needed nebulizer. Continue Dulera, Incruse Ellipta ordered She does not seem to be getting better as fast as I expected. Continue IV steroids for now monitor another day   History of adenocarcinoma of lung [facility]) Oncology following.  Bipolar disorder (HCC)// PTSD Continue Zoloft.   Essential  HTN: Continue Cozaar, metoprolol.   HLD: Continue Crestor.   Paroxysmal atrial fibrillation (HCC) Continue Eliquis. HR controlled.   Hypothyroidism: Continue Synthroid.   Tobacco use: Encourage cessation -Declines nicotine patch.   DVT  prophylaxis: Eliquis Code Status: DNR Family Communication: No family at bed side. Disposition Plan:    Status is: Inpatient Remains inpatient appropriate because: Severity of  illness.    Consultants:  None  Procedures: None  Antimicrobials:  Anti-infectives (From admission, onward)    Start     Dose/Rate Route Frequency Ordered Stop   12/14/23 1000  amoxicillin-clavulanate (AUGMENTIN) 875-125 MG per tablet 1 tablet        1 tablet Oral Every 12 hours 12/14/23 0809        Subjective: Patient was seen and examined at bedside. Overnight events noted. Patient still reports short of breath,  still has significant wheezing noted on exam.  Objective: Vitals:   12/18/23 0801 12/18/23 1009 12/18/23 1024 12/18/23 1332  BP:   115/66 111/71  Pulse:   77 80  Resp:    18  Temp:    98.4 F (36.9 C)  TempSrc:    Oral  SpO2: 98% 97% 98% 98%  Weight:      Height:        Intake/Output Summary (Last 24 hours) at 12/18/2023 1405 Last data filed at 12/18/2023 0900 Gross per 24 hour  Intake 240 ml  Output --  Net 240 ml   Filed Weights   12/12/23 2236 12/13/23 0546  Weight: 45.4 kg 44.1 kg    Examination:  General exam: Appears calm and comfortable , deconditioned, not in any acute distress. Respiratory system: Wheezing bilaterally. Respiratory effort normal. Cardiovascular system: S1 & S2 heard, RRR. No JVD, murmurs, rubs, gallops or clicks.  Gastrointestinal system: Abdomen is non distended, soft and non tender. Normal bowel sounds heard. Central nervous system: Alert and oriented  X 3. No focal neurological deficits. Extremities: No edema, no cyanosis, no clubbing Skin: No rashes, lesions or ulcers Psychiatry: Judgement and insight appear normal. Mood & affect appropriate.     Data Reviewed: I have personally reviewed following labs and imaging studies  CBC: Recent Labs  Lab 12/12/23 2252 12/13/23 0616 12/16/23 0556  WBC 12.8* 8.2 11.4*  NEUTROABS  --  7.5  --    HGB 12.7 13.0 12.2  HCT 41.1 43.5 41.2  MCV 94.7 96.5 96.0  PLT 259 275 253   Basic Metabolic Panel: Recent Labs  Lab 12/12/23 2252 12/13/23 0616 12/16/23 0556  NA 142 138 135  K 3.2* 4.2 3.9  CL 103 97* 98  CO2 30 32 30  GLUCOSE 114* 145* 203*  BUN 11 16 26*  CREATININE 0.47 0.57 0.74  CALCIUM 8.0* 8.8* 8.6*   GFR: Estimated Creatinine Clearance: 48.8 mL/min (by C-G formula based on SCr of 0.74 mg/dL). Liver Function Tests: No results for input(s): "AST", "ALT", "ALKPHOS", "BILITOT", "PROT", "ALBUMIN" in the last 168 hours. No results for input(s): "LIPASE", "AMYLASE" in the last 168 hours. No results for input(s): "AMMONIA" in the last 168 hours. Coagulation Profile: No results for input(s): "INR", "PROTIME" in the last 168 hours. Cardiac Enzymes: No results for input(s): "CKTOTAL", "CKMB", "CKMBINDEX", "TROPONINI" in the last 168 hours. BNP (last 3 results) No results for input(s): "PROBNP" in the last 8760 hours. HbA1C: No results for input(s): "HGBA1C" in the last 72 hours. CBG: No results for input(s): "GLUCAP" in the last 168 hours. Lipid Profile: No results for input(s): "CHOL", "HDL", "LDLCALC", "TRIG", "CHOLHDL", "LDLDIRECT" in the last 72 hours. Thyroid Function Tests: No results for input(s): "TSH", "T4TOTAL", "FREET4", "T3FREE", "THYROIDAB" in the last 72 hours. Anemia Panel: No results for input(s): "VITAMINB12", "FOLATE", "FERRITIN", "TIBC", "IRON", "RETICCTPCT" in the last 72 hours. Sepsis Labs: No results for input(s): "PROCALCITON", "LATICACIDVEN" in the last 168 hours.  Recent Results (from the past 240 hours)  SARS Coronavirus 2 by RT PCR (hospital order, performed in [facility] hospital lab) *cepheid single result test* Anterior Nasal Swab     Status: None   Collection Time: 12/12/23 10:52 PM   Specimen: Anterior Nasal Swab  Result Value Ref Range Status   SARS Coronavirus 2 by RT PCR NEGATIVE NEGATIVE Final    Comment: (NOTE) SARS-CoV-2  target nucleic acids are NOT DETECTED.  The SARS-CoV-2 RNA is generally detectable in upper and lower respiratory specimens during the acute phase of infection. The lowest concentration of SARS-CoV-2 viral copies this assay can detect is 250 copies / mL. A negative result does not preclude SARS-CoV-2 infection and should not be used as the sole basis for treatment or other patient management decisions.  A negative result may occur with improper specimen collection / handling, submission of specimen other than nasopharyngeal swab, presence of viral mutation(s) within the areas targeted by this assay, and inadequate number of viral copies (<250 copies / mL). A negative result must be combined with clinical observations, patient history, and epidemiological information.  Fact Sheet for Patients:   RoadLapTop.co.za  Fact Sheet for Healthcare Providers: http://kim-miller.com/  This test is not yet approved or  cleared by the [place] FDA and has been authorized for detection and/or diagnosis of SARS-CoV-2 by FDA under an Emergency Use Authorization (EUA).  This EUA will remain in effect (meaning this test can be used) for the duration of the COVID-19 declaration under Section 564(b)(1) of the Act, 21 U.S.C. section 360bbb-3(b)(1),  unless the authorization is terminated or revoked sooner.  Performed at [facility], [address], [place], [place] [address]     Radiology Studies: No results found.  Scheduled Meds:  albuterol  2.5 mg Nebulization TID   amoxicillin-clavulanate  1 tablet Oral Q12H   apixaban  5 mg Oral BID   feeding supplement  237 mL Oral TID BM   gatifloxacin  1 drop Left Eye QID   levothyroxine  125 mcg Oral Q0600   magnesium oxide  400 mg Oral Daily   methylPREDNISolone (SOLU-MEDROL) injection  40 mg Intravenous Q12H   metoprolol succinate  12.5 mg Oral Daily   mometasone-formoterol  2 puff  Inhalation BID   multivitamin with minerals  1 tablet Oral Daily   oxyCODONE  5 mg Oral Once   polyvinyl alcohol  1 drop Both Eyes QID   prednisoLONE acetate  1 drop Left Eye QID   rosuvastatin  20 mg Oral Daily   sertraline  25 mg Oral Daily   umeclidinium bromide  1 puff Inhalation Daily   Continuous Infusions:   LOS: 4 days    Time spent: 35 mins    Willeen Niece, MD [facility]   If 7PM-7AM, please contact night-coverage

## 2023-12-19 ENCOUNTER — Other Ambulatory Visit (HOSPITAL_COMMUNITY): Payer: Self-pay

## 2023-12-19 DIAGNOSIS — J441 Chronic obstructive pulmonary disease with (acute) exacerbation: Secondary | ICD-10-CM | POA: Diagnosis not present

## 2023-12-19 MED ORDER — PREDNISONE 20 MG PO TABS
40.0000 mg | ORAL_TABLET | Freq: Every day | ORAL | 0 refills | Status: AC
  Filled 2023-12-19: qty 6, 3d supply, fill #0

## 2023-12-19 MED ORDER — BENZONATATE 100 MG PO CAPS
100.0000 mg | ORAL_CAPSULE | Freq: Three times a day (TID) | ORAL | 0 refills | Status: DC | PRN
  Filled 2023-12-19: qty 20, 7d supply, fill #0

## 2023-12-19 MED ORDER — AMOXICILLIN-POT CLAVULANATE 875-125 MG PO TABS: 1.0000 | ORAL_TABLET | Freq: Two times a day (BID) | ORAL | 0 refills | Status: DC

## 2023-12-19 MED ORDER — PREDNISONE 20 MG PO TABS: 40.0000 mg | ORAL_TABLET | Freq: Every day | ORAL | 0 refills | Status: DC

## 2023-12-19 MED ORDER — AMOXICILLIN-POT CLAVULANATE 875-125 MG PO TABS
1.0000 | ORAL_TABLET | Freq: Two times a day (BID) | ORAL | 0 refills | Status: AC
  Filled 2023-12-19: qty 2, 1d supply, fill #0

## 2023-12-19 MED ORDER — BENZONATATE 100 MG PO CAPS: 100.0000 mg | ORAL_CAPSULE | Freq: Three times a day (TID) | ORAL | 0 refills | Status: DC | PRN

## 2023-12-19 NOTE — Care Management Important Message (Signed)
 Important Message  Patient Details IM Letter given to the Patient. Name: [name] MRN: [id number] Date of Birth: February 18, 1956   Important Message Given:  Yes - Medicare IM     Caren Macadam 12/19/2023, 11:09 AM

## 2023-12-19 NOTE — Plan of Care (Signed)
 Went over discharge paper work. Pt verbalized understanding. PIV intact upon removal. Vitals stable. Pt going by safe transport. Problem: Education: Goal: Knowledge of General Education information will improve Description: Including pain rating scale, medication(s)/side effects and non-pharmacologic comfort measures Outcome: Adequate for Discharge   Problem: Health Behavior/Discharge Planning: Goal: Ability to manage health-related needs will improve Outcome: Adequate for Discharge   Problem: Clinical Measurements: Goal: Ability to maintain clinical measurements within normal limits will improve Outcome: Adequate for Discharge Goal: Will remain free from infection Outcome: Adequate for Discharge Goal: Diagnostic test results will improve Outcome: Adequate for Discharge Goal: Respiratory complications will improve Outcome: Adequate for Discharge Goal: Cardiovascular complication will be avoided Outcome: Adequate for Discharge   Problem: Activity: Goal: Risk for activity intolerance will decrease Outcome: Adequate for Discharge   Problem: Nutrition: Goal: Adequate nutrition will be maintained Outcome: Adequate for Discharge   Problem: Coping: Goal: Level of anxiety will decrease Outcome: Adequate for Discharge   Problem: Elimination: Goal: Will not experience complications related to bowel motility Outcome: Adequate for Discharge Goal: Will not experience complications related to urinary retention Outcome: Adequate for Discharge   Problem: Pain Managment: Goal: General experience of comfort will improve and/or be controlled Outcome: Adequate for Discharge   Problem: Safety: Goal: Ability to remain free from injury will improve Outcome: Adequate for Discharge   Problem: Skin Integrity: Goal: Risk for impaired skin integrity will decrease Outcome: Adequate for Discharge   Problem: Education: Goal: Knowledge of disease or condition will improve Outcome: Adequate for  Discharge Goal: Knowledge of the prescribed therapeutic regimen will improve Outcome: Adequate for Discharge Goal: Individualized Educational Video(s) Outcome: Adequate for Discharge   Problem: Activity: Goal: Ability to tolerate increased activity will improve Outcome: Adequate for Discharge Goal: Will verbalize the importance of balancing activity with adequate rest periods Outcome: Adequate for Discharge   Problem: Respiratory: Goal: Ability to maintain a clear airway will improve Outcome: Adequate for Discharge Goal: Levels of oxygenation will improve Outcome: Adequate for Discharge Goal: Ability to maintain adequate ventilation will improve Outcome: Adequate for Discharge

## 2023-12-19 NOTE — TOC Transition Note (Signed)
 Transition of Care [facility] - Discharge Note   Patient Details  Name: Tracy Park MRN: [id number] Date of Birth: 1955-10-23  Transition of Care [facility]) CM/SW Contact:  Beckie Busing, RN Phone Number:[phone]  12/19/2023, 11:26 AM   Clinical Narrative:    Patient discharging home. CM received message from nurse stating patient does not have transportation home. Patient is O2 dependent and would not be a candidate for bus pass. Transportation has been arranged via safe transport. Nurse has been updated. No other TOC needs noted.    Final next level of care: Home/Self Care Barriers to Discharge: No Barriers Identified   Patient Goals and CMS Choice Patient states their goals for this hospitalization and ongoing recovery are:: retun home          Discharge Placement                       Discharge Plan and Services Additional resources added to the After Visit Summary for                  DME Arranged: N/A DME Agency: NA       HH Arranged: NA HH Agency: NA        Social Drivers of Health (SDOH) Interventions SDOH Screenings   Food Insecurity: No Food Insecurity (12/13/2023)  Recent Concern: Food Insecurity - Food Insecurity Present (10/08/2023)   Received from [facility]  Housing: Low Risk  (12/13/2023)  Recent Concern: Housing - High Risk (09/23/2023)  Transportation Needs: No Transportation Needs (12/13/2023)  Recent Concern: Transportation Needs - Unmet Transportation Needs (10/08/2023)   Received from [facility]  Utilities: Not At Risk (12/13/2023)  Financial Resource Strain: High Risk (10/08/2023)   Received from [facility]  Physical Activity: Unknown (04/07/2023)   Received from [facility]  Social Connections: Moderately Isolated (12/13/2023)  Stress: Stress Concern Present (04/07/2023)   Received from [facility]  Tobacco Use: High Risk (12/12/2023)     Readmission Risk Interventions    11/07/2023    8:20 AM 09/28/2023    4:41 PM  09/24/2023    2:07 PM  Readmission Risk Prevention Plan  Transportation Screening Complete Complete Complete  Medication Review Oceanographer) Complete Complete Complete  PCP or Specialist appointment within 3-5 days of discharge Complete Complete Complete  HRI or Home Care Consult Complete Complete Complete  SW Recovery Care/Counseling Consult Complete Complete Complete  Palliative Care Screening Not Applicable Not Applicable Not Applicable  Skilled Nursing Facility Not Applicable Not Applicable Not Applicable

## 2023-12-19 NOTE — Discharge Summary (Signed)
 Physician Discharge Summary  Tracy Park:[id number] DOB: 08/19/1956 DOA: 12/12/2023  PCP: Loleta Dicker, FNP  Admit date: 12/12/2023  Discharge date: 12/19/2023  Admitted From: Home  Disposition:  Home  Recommendations for Outpatient Follow-up:  Follow up with PCP in 1-2 weeks. Please obtain BMP/CBC in one week. Advised to take prednisone 40 mg daily for 3 more days. Advised to continue nebulized bronchodilators as scheduled. Advised to follow-up with pulmonology at [facility].  Home Health: None Equipment/Devices:Home oxygen  Discharge Condition: Stable CODE STATUS: DNR Diet recommendation: Heart Healthy  Brief Summary/ Hospital Course: This 68 year old female with history of lung cancer stage III status post lobectomy in 2018, COPD, chronic respiratory failure on 2 L nasal cannula at baseline, bipolar disorder, paroxysmal atrial fibrillation, hypothyroidism. She presents to the ER with complaints of dyspnea with activity. Per patient she has chronic shortness of breath however over the past 2 days has gotten significantly worse. Patient endorses wheezing, cough, shortness of breath, chest tightness with ambulation. She denies fevers or chills. Patient states she is only able to walk 3-4 steps without becoming extremely short of breath. She came to the ER. Slow to improve, poor overall prognosis.  Patient was admitted for COPD exacerbation. Patient was continued on IV Solu-Medrol and nebulized bronchodilators.  Patient has taken long but has significantly improved.  Patient feels better and wants to be discharged. Patient being discharged home on prednisone for few more days.  Advised to follow-up with pulmonology at [facility].  Discharge Diagnoses:  Principal Problem:   Acute exacerbation of chronic obstructive pulmonary disease (COPD) (HCC) Active Problems:   Paroxysmal atrial fibrillation (HCC)   Hypothyroidism   Adenocarcinoma of lung (HCC)   Bipolar disorder (HCC)    Hyperlipidemia   COPD with acute exacerbation (HCC)   Chronic respiratory failure with hypoxia (HCC)  Acute on chronic hypoxic respiratory failure: Acute exacerbation of COPD: Continue home oxygen (wears 2L at home). Continue Tessalon Perles as needed. Flutter valve Continue IV Solu-Medrol, scheduled and as needed nebulizer. Continue Dulera, Incruse Ellipta ordered She does not seem to be getting better as fast as I expected. Continue IV steroids for now monitor another day. Patient subsequently improved back to baseline oxygen requirement.  History of adenocarcinoma of lung [facility]) Oncology following. F/u outpatient    Bipolar disorder (HCC)// PTSD Continue Zoloft.   Essential  HTN: Continue Cozaar, metoprolol.   HLD: Continue Crestor.   Paroxysmal atrial fibrillation (HCC) Continue Eliquis. HR controlled.   Hypothyroidism: Continue Synthroid.   Tobacco use: Encourage cessation -Declines nicotine patch.    Discharge Instructions  Discharge Instructions     Call MD for:  difficulty breathing, headache or visual disturbances   Complete by: As directed    Call MD for:  persistant dizziness or light-headedness   Complete by: As directed    Call MD for:  persistant nausea and vomiting   Complete by: As directed    Diet - low sodium heart healthy   Complete by: As directed    Diet Carb Modified   Complete by: As directed    Discharge instructions   Complete by: As directed    Advised to follow-up with primary care physician in 1 week. Advised to take prednisone 40 mg daily for 3 more days. Advised to continue nebulized bronchodilators as scheduled. Advised to follow-up with pulmonology at [facility].   Increase activity slowly   Complete by: As directed       Allergies as of 12/19/2023  Reactions   Red Dye #40 (allura Red) Hives, Itching, Other (See Comments)   Red food dye   Strawberry Extract Hives, Itching   Tomato Hives, Itching   Aspirin Hives    Tape Rash, Other (See Comments)   Prefers paper tape   Wound Dressing Adhesive Rash        Medication List     STOP taking these medications    doxycycline 100 MG capsule Commonly known as: VIBRAMYCIN       TAKE these medications    albuterol (2.5 MG/3ML) 0.083% nebulizer solution Commonly known as: PROVENTIL Take 3 mLs (2.5 mg total) by nebulization every 4 (four) hours while awake for 3 days, THEN 3 mLs (2.5 mg total) every 4 (four) hours as needed for wheezing or shortness of breath. Start taking on: October 18, 2023 What changed: See the new instructions.   albuterol 108 (90 Base) MCG/ACT inhaler Commonly known as: VENTOLIN HFA Inhale 2 puffs into the lungs every 6 (six) hours as needed for wheezing or shortness of breath. What changed: Another medication with the same name was changed. Make sure you understand how and when to take each.   amoxicillin-clavulanate 875-125 MG tablet Commonly known as: AUGMENTIN Take 1 tablet by mouth every 12 (twelve) hours for 1 day.   benzonatate 100 MG capsule Commonly known as: TESSALON Take 1 capsule (100 mg total) by mouth 3 (three) times daily as needed for cough.   Breztri Aerosphere 160-9-4.8 MCG/ACT Aero inhaler Generic drug: budeson-glycopyrrolate-formoterol Inhale 1 puff into the lungs in the morning and at bedtime.   Eliquis 5 MG Tabs tablet Generic drug: apixaban Take 1 tablet (5 mg total) by mouth 2 (two) times daily.   levothyroxine 125 MCG tablet Commonly known as: SYNTHROID Take 125 mcg by mouth daily before breakfast.   losartan 25 MG tablet Commonly known as: COZAAR Take 12.5 mg by mouth daily.   magnesium oxide 400 (240 Mg) MG tablet Commonly known as: MAG-OX Take 400 mg by mouth in the morning.   metoprolol succinate 25 MG 24 hr tablet Commonly known as: TOPROL-XL Take 0.5 tablets (12.5 mg total) by mouth daily.   moxifloxacin 0.5 % ophthalmic solution Commonly known as: VIGAMOX Place 1 drop  into the left eye 4 (four) times daily.   nitroGLYCERIN 0.4 MG SL tablet Commonly known as: NITROSTAT Place 0.4 mg under the tongue every 5 (five) minutes as needed for chest pain.   oxyCODONE 5 MG immediate release tablet Commonly known as: Roxicodone Take 1 tablet (5 mg total) by mouth every 6 (six) hours as needed for severe pain (pain score 7-10).   OXYGEN Inhale 2 L/min into the lungs continuous.   prednisoLONE acetate 1 % ophthalmic suspension Commonly known as: PRED FORTE Place 1 drop into the left eye 4 (four) times daily.   predniSONE 20 MG tablet Commonly known as: DELTASONE Take 2 tablets (40 mg total) by mouth daily with breakfast for 3 days. What changed:  how much to take how to take this when to take this additional instructions   rosuvastatin 20 MG tablet Commonly known as: CRESTOR Take 20 mg by mouth daily.   sertraline 25 MG tablet Commonly known as: ZOLOFT Take 25 mg by mouth in the morning.   Systane Complete PF 0.6 % Soln Generic drug: Propylene Glycol (PF) Place 1 drop into both eyes 4 (four) times daily.   Tylenol 8 Hour 650 MG CR tablet Generic drug: acetaminophen Take 650-1,300 mg by mouth  2 (two) times daily as needed for pain.        Follow-up Information     Loleta Dicker, FNP Follow up in 1 week(s).   Specialty: Family Medicine Contact information: [address] [place] [address] [phone]                Allergies  Allergen Reactions   Red Dye #40 (Allura Red) Hives, Itching and Other (See Comments)    Red food dye   Strawberry Extract Hives and Itching   Tomato Hives and Itching   Aspirin Hives   Tape Rash and Other (See Comments)    Prefers paper tape   Wound Dressing Adhesive Rash    Consultations: None   Procedures/Studies: CT Angio Chest PE W/Cm &/Or Wo Cm Result Date: 12/13/2023 CLINICAL DATA:  Suspected pulmonary embolism. EXAM: CT ANGIOGRAPHY CHEST WITH CONTRAST TECHNIQUE:  Multidetector CT imaging of the chest was performed using the standard protocol during bolus administration of intravenous contrast. Multiplanar CT image reconstructions and MIPs were obtained to evaluate the vascular anatomy. RADIATION DOSE REDUCTION: This exam was performed according to the departmental dose-optimization program which includes automated exposure control, adjustment of the mA and/or kV according to patient size and/or use of iterative reconstruction technique. CONTRAST:  80mL OMNIPAQUE IOHEXOL 350 MG/ML SOLN COMPARISON:  November 25, 2023 FINDINGS: Cardiovascular: There is marked severity calcification of the aortic arch, without evidence of aortic aneurysm. Satisfactory opacification of the pulmonary arteries to the segmental level. No evidence of pulmonary embolism. Normal heart size with mild coronary artery calcification. No pericardial effusion. Mediastinum/Nodes: No enlarged mediastinal, hilar, or axillary lymph nodes. Thyroid gland, trachea, and esophagus demonstrate no significant findings. Lungs/Pleura: There is evidence of prior left upper lobectomy with subsequent left-sided volume loss. Emphysematous lung disease is also seen with bullous changes and chronic scarring seen within the superior segment of the left lower lobe. Mild, stable subpleural consolidation is seen along the posterior aspect of the right upper lobe. Mild posterior left lower lobe and posterior right basilar linear atelectasis is seen. No pleural effusion or pneumothorax is identified. Upper Abdomen: No acute abnormality. Musculoskeletal: Postoperative changes are seen within the visualized portion of the lower cervical spine. No acute osseous abnormalities are identified. Review of the MIP images confirms the above findings. IMPRESSION: 1. No evidence of pulmonary embolism. 2. Evidence of prior left upper lobectomy with subsequent left-sided volume loss. 3. Emphysematous lung disease with bullous changes and chronic  scarring within the superior segment of the left lower lobe. 4. Mild, stable subpleural consolidation along the posterior aspect of the right upper lobe. 5. Mild posterior left lower lobe and posterior right basilar linear atelectasis. 6. Aortic atherosclerosis. Aortic Atherosclerosis (ICD10-I70.0) and Emphysema (ICD10-J43.9). Electronically Signed   By: Aram Candela M.D.   On: 12/13/2023 03:19   DG Chest 2 View Result Date: 12/12/2023 CLINICAL DATA:  Chest pain EXAM: CHEST - 2 VIEW COMPARISON:  12/09/2023, 08/06/2023 FINDINGS: Right lung is grossly clear. Chronic volume loss and postoperative change in the left thorax prick no acute airspace disease, pleural effusion or pneumothorax. Emphysema. IMPRESSION: No active cardiopulmonary disease. Emphysema. Electronically Signed   By: Jasmine Pang M.D.   On: 12/12/2023 23:33   DG Chest 2 View Result Date: 12/09/2023 CLINICAL DATA:  Shortness of breath. EXAM: CHEST - 2 VIEW COMPARISON:  12/03/2023. FINDINGS: Redemonstration of opacity overlying the left upper lung zone which is essentially similar to prior studies and compatible with provided history of lung  malignancy, status post treatment. Bilateral lungs otherwise appear hyperexpanded and hyperlucent with coarse bronchovascular markings, in keeping with COPD. No new dense consolidation or lung collapse. Bilateral costophrenic angles are clear. Normal cardio-mediastinal silhouette. No acute osseous abnormalities. The soft tissues are within normal limits. IMPRESSION: No active cardiopulmonary disease. COPD. Electronically Signed   By: Jules Schick M.D.   On: 12/09/2023 15:01   DG Chest 2 View Result Date: 12/03/2023 CLINICAL DATA:  Shortness of breath EXAM: CHEST - 2 VIEW COMPARISON:  11/25/2023 chest x-ray and CT, 06/24/2023 FINDINGS: Chronic postsurgical changes and pleuroparenchymal scarring at left apex. Vague right upper lobe pulmonary opacity. Stable cardiomediastinal silhouette. No pneumothorax  IMPRESSION: 1. Vague right upper lobe pulmonary opacity, possible pneumonia. Imaging follow-up to resolution is recommended 2. Chronic postsurgical changes and pleuroparenchymal scarring at left apex. Electronically Signed   By: Jasmine Pang M.D.   On: 12/03/2023 16:54   CT Angio Chest PE W and/or Wo Contrast Result Date: 11/25/2023 CLINICAL DATA:  Short of breath, left chest pain, history of lung cancer EXAM: CT ANGIOGRAPHY CHEST WITH CONTRAST TECHNIQUE: Multidetector CT imaging of the chest was performed using the standard protocol during bolus administration of intravenous contrast. Multiplanar CT image reconstructions and MIPs were obtained to evaluate the vascular anatomy. RADIATION DOSE REDUCTION: This exam was performed according to the departmental dose-optimization program which includes automated exposure control, adjustment of the mA and/or kV according to patient size and/or use of iterative reconstruction technique. CONTRAST:  75mL OMNIPAQUE IOHEXOL 350 MG/ML SOLN COMPARISON:  11/25/2023, 08/03/2023, 03/30/2022 FINDINGS: Cardiovascular: This is a technically adequate evaluation of the pulmonary vasculature. No filling defects or pulmonary emboli. The heart is unremarkable without pericardial effusion. No evidence of thoracic aortic aneurysm or dissection. Atherosclerosis of the aorta and coronary vasculature. Mediastinum/Nodes: No enlarged mediastinal, hilar, or axillary lymph nodes. Thyroid gland, trachea, and esophagus demonstrate no significant findings. Lungs/Pleura: Prior left upper lobectomy with associated left-sided volume loss. Bullous changes and chronic scarring are again seen within the superior segment of the left lower lobe. Stable emphysema. There is increasing subpleural consolidation within the right upper lobe abutting the major fissure, measuring 2.1 x 0.9 cm reference image 34/4. No acute airspace disease, effusion, or pneumothorax. Central airways are patent. Upper Abdomen: No  acute abnormality. Musculoskeletal: No acute or destructive bony abnormalities. Chronic mild compression deformity superior endplate of the T11 vertebral body. Reconstructed images demonstrate no additional findings. Review of the MIP images confirms the above findings. IMPRESSION: 1. No evidence of pulmonary embolus. 2. Chronic postsurgical changes from left upper lobectomy, with cystic changes and scarring within the superior segment of the left lower lobe. 3. Increasing subpleural consolidation within the right upper lobe abutting the major fissure, measuring up to 2.1 x 0.9 cm. Given the patient's history of lung cancer and the progression since prior exams, further evaluation with PET scan may be useful. 4. Aortic Atherosclerosis (ICD10-I70.0) and Emphysema (ICD10-J43.9). Electronically Signed   By: Sharlet Salina M.D.   On: 11/25/2023 22:39   DG Chest 2 View Result Date: 11/25/2023 CLINICAL DATA:  Shortness of breath.  History of lung cancer. EXAM: CHEST - 2 VIEW COMPARISON:  November 01, 2023. FINDINGS: The heart size and mediastinal contours are within normal limits. Stable scarring seen in left upper lobe consistent with surgery. Emphysematous disease is again noted. No definite acute abnormality seen. The visualized skeletal structures are unremarkable. IMPRESSION: Chronic findings as noted above. No definite acute abnormality noted. Electronically Signed   By: Fayrene Fearing  Christen Butter M.D.   On: 11/25/2023 20:01    Subjective: Seen and examined at bedside.  Overnight events noted.   Patient reports doing much better,  feels better and breathing has improved.  she wants to be discharged.  Discharge Exam: Vitals:   12/19/23 0753 12/19/23 0955  BP:  113/67  Pulse:  84  Resp:    Temp:    SpO2: 99%    Vitals:   12/19/23 0453 12/19/23 0751 12/19/23 0753 12/19/23 0955  BP: 110/72   113/67  Pulse: 75   84  Resp: 16     Temp: 98.6 F (37 C)     TempSrc: Oral     SpO2: 98% 99% 99%   Weight:       Height:        General: Pt is alert, awake, not in acute distress Cardiovascular: RRR, S1/S2 +, no rubs, no gallops Respiratory: CTA bilaterally, no wheezing, no rhonchi Abdominal: Soft, NT, ND, bowel sounds + Extremities: no edema, no cyanosis    The results of significant diagnostics from this hospitalization (including imaging, microbiology, ancillary and laboratory) are listed below for reference.     Microbiology: Recent Results (from the past 240 hours)  SARS Coronavirus 2 by RT PCR (hospital order, performed in [facility] hospital lab) *cepheid single result test* Anterior Nasal Swab     Status: None   Collection Time: 12/12/23 10:52 PM   Specimen: Anterior Nasal Swab  Result Value Ref Range Status   SARS Coronavirus 2 by RT PCR NEGATIVE NEGATIVE Final    Comment: (NOTE) SARS-CoV-2 target nucleic acids are NOT DETECTED.  The SARS-CoV-2 RNA is generally detectable in upper and lower respiratory specimens during the acute phase of infection. The lowest concentration of SARS-CoV-2 viral copies this assay can detect is 250 copies / mL. A negative result does not preclude SARS-CoV-2 infection and should not be used as the sole basis for treatment or other patient management decisions.  A negative result may occur with improper specimen collection / handling, submission of specimen other than nasopharyngeal swab, presence of viral mutation(s) within the areas targeted by this assay, and inadequate number of viral copies (<250 copies / mL). A negative result must be combined with clinical observations, patient history, and epidemiological information.  Fact Sheet for Patients:   RoadLapTop.co.za  Fact Sheet for Healthcare Providers: http://kim-miller.com/  This test is not yet approved or  cleared by the [place] FDA and has been authorized for detection and/or diagnosis of SARS-CoV-2 by FDA under an Emergency Use  Authorization (EUA).  This EUA will remain in effect (meaning this test can be used) for the duration of the COVID-19 declaration under Section 564(b)(1) of the Act, 21 U.S.C. section 360bbb-3(b)(1), unless the authorization is terminated or revoked sooner.  Performed at [facility], [address], [place], [place] [address]      Labs: BNP (last 3 results) Recent Labs    08/03/23 1723 11/01/23 1223 12/09/23 1219  BNP 68.2 62.4 107.2*   Basic Metabolic Panel: Recent Labs  Lab 12/12/23 2252 12/13/23 0616 12/16/23 0556  NA 142 138 135  K 3.2* 4.2 3.9  CL 103 97* 98  CO2 30 32 30  GLUCOSE 114* 145* 203*  BUN 11 16 26*  CREATININE 0.47 0.57 0.74  CALCIUM 8.0* 8.8* 8.6*   Liver Function Tests: No results for input(s): "AST", "ALT", "ALKPHOS", "BILITOT", "PROT", "ALBUMIN" in the last 168 hours. No results for input(s): "LIPASE", "AMYLASE"  in the last 168 hours. No results for input(s): "AMMONIA" in the last 168 hours. CBC: Recent Labs  Lab 12/12/23 2252 12/13/23 0616 12/16/23 0556  WBC 12.8* 8.2 11.4*  NEUTROABS  --  7.5  --   HGB 12.7 13.0 12.2  HCT 41.1 43.5 41.2  MCV 94.7 96.5 96.0  PLT 259 275 253   Cardiac Enzymes: No results for input(s): "CKTOTAL", "CKMB", "CKMBINDEX", "TROPONINI" in the last 168 hours. BNP: Invalid input(s): "POCBNP" CBG: No results for input(s): "GLUCAP" in the last 168 hours. D-Dimer No results for input(s): "DDIMER" in the last 72 hours. Hgb A1c No results for input(s): "HGBA1C" in the last 72 hours. Lipid Profile No results for input(s): "CHOL", "HDL", "LDLCALC", "TRIG", "CHOLHDL", "LDLDIRECT" in the last 72 hours. Thyroid function studies No results for input(s): "TSH", "T4TOTAL", "T3FREE", "THYROIDAB" in the last 72 hours.  Invalid input(s): "FREET3" Anemia work up No results for input(s): "VITAMINB12", "FOLATE", "FERRITIN", "TIBC", "IRON", "RETICCTPCT" in the last 72 hours. Urinalysis    Component  Value Date/Time   COLORURINE YELLOW 06/25/2023 0536   APPEARANCEUR CLEAR 06/25/2023 0536   LABSPEC 1.023 06/25/2023 0536   PHURINE 6.0 06/25/2023 0536   GLUCOSEU 50 (A) 06/25/2023 0536   HGBUR SMALL (A) 06/25/2023 0536   BILIRUBINUR NEGATIVE 06/25/2023 0536   KETONESUR NEGATIVE 06/25/2023 0536   PROTEINUR NEGATIVE 06/25/2023 0536   UROBILINOGEN 0.2 11/06/2009 1559   NITRITE NEGATIVE 06/25/2023 0536   LEUKOCYTESUR NEGATIVE 06/25/2023 0536   Sepsis Labs Recent Labs  Lab 12/12/23 2252 12/13/23 0616 12/16/23 0556  WBC 12.8* 8.2 11.4*   Microbiology Recent Results (from the past 240 hours)  SARS Coronavirus 2 by RT PCR (hospital order, performed in [facility] hospital lab) *cepheid single result test* Anterior Nasal Swab     Status: None   Collection Time: 12/12/23 10:52 PM   Specimen: Anterior Nasal Swab  Result Value Ref Range Status   SARS Coronavirus 2 by RT PCR NEGATIVE NEGATIVE Final    Comment: (NOTE) SARS-CoV-2 target nucleic acids are NOT DETECTED.  The SARS-CoV-2 RNA is generally detectable in upper and lower respiratory specimens during the acute phase of infection. The lowest concentration of SARS-CoV-2 viral copies this assay can detect is 250 copies / mL. A negative result does not preclude SARS-CoV-2 infection and should not be used as the sole basis for treatment or other patient management decisions.  A negative result may occur with improper specimen collection / handling, submission of specimen other than nasopharyngeal swab, presence of viral mutation(s) within the areas targeted by this assay, and inadequate number of viral copies (<250 copies / mL). A negative result must be combined with clinical observations, patient history, and epidemiological information.  Fact Sheet for Patients:   RoadLapTop.co.za  Fact Sheet for Healthcare Providers: http://kim-miller.com/  This test is not yet approved or  cleared  by the [place] FDA and has been authorized for detection and/or diagnosis of SARS-CoV-2 by FDA under an Emergency Use Authorization (EUA).  This EUA will remain in effect (meaning this test can be used) for the duration of the COVID-19 declaration under Section 564(b)(1) of the Act, 21 U.S.C. section 360bbb-3(b)(1), unless the authorization is terminated or revoked sooner.  Performed at [facility], [address], [place], [place] [address]      Time coordinating discharge: Over 30 minutes  SIGNED:   Willeen Niece, MD  [facility] 12/19/2023, 1:25 PM Pager   If 7PM-7AM, please contact night-coverage

## 2023-12-19 NOTE — Progress Notes (Signed)
 TOC meds in a secure bag delivered to pt in [employer] car by this RN

## 2023-12-28 ENCOUNTER — Emergency Department (HOSPITAL_COMMUNITY)

## 2023-12-28 ENCOUNTER — Encounter (HOSPITAL_COMMUNITY): Payer: Self-pay

## 2023-12-28 ENCOUNTER — Other Ambulatory Visit: Payer: Self-pay

## 2023-12-28 ENCOUNTER — Inpatient Hospital Stay (HOSPITAL_COMMUNITY)
Admission: EM | Admit: 2023-12-28 | Discharge: 2023-12-30 | DRG: 190 | Disposition: A | Discharge status: Home / Self Care | Attending: Family Medicine | Admitting: Family Medicine

## 2023-12-28 DIAGNOSIS — J441 Chronic obstructive pulmonary disease with (acute) exacerbation: Secondary | ICD-10-CM | POA: Diagnosis present

## 2023-12-28 DIAGNOSIS — I480 Paroxysmal atrial fibrillation: Secondary | ICD-10-CM | POA: Diagnosis present

## 2023-12-28 DIAGNOSIS — E039 Hypothyroidism, unspecified: Secondary | ICD-10-CM | POA: Diagnosis present

## 2023-12-28 DIAGNOSIS — F319 Bipolar disorder, unspecified: Secondary | ICD-10-CM | POA: Diagnosis present

## 2023-12-28 DIAGNOSIS — F4310 Post-traumatic stress disorder, unspecified: Secondary | ICD-10-CM | POA: Diagnosis present

## 2023-12-28 DIAGNOSIS — Z85118 Personal history of other malignant neoplasm of bronchus and lung: Secondary | ICD-10-CM | POA: Diagnosis not present

## 2023-12-28 DIAGNOSIS — Z8249 Family history of ischemic heart disease and other diseases of the circulatory system: Secondary | ICD-10-CM | POA: Diagnosis not present

## 2023-12-28 DIAGNOSIS — J9611 Chronic respiratory failure with hypoxia: Secondary | ICD-10-CM | POA: Diagnosis present

## 2023-12-28 DIAGNOSIS — J9621 Acute and chronic respiratory failure with hypoxia: Principal | ICD-10-CM | POA: Diagnosis present

## 2023-12-28 DIAGNOSIS — Z7952 Long term (current) use of systemic steroids: Secondary | ICD-10-CM

## 2023-12-28 DIAGNOSIS — Z79890 Hormone replacement therapy (postmenopausal): Secondary | ICD-10-CM

## 2023-12-28 DIAGNOSIS — Z9981 Dependence on supplemental oxygen: Secondary | ICD-10-CM

## 2023-12-28 DIAGNOSIS — Z833 Family history of diabetes mellitus: Secondary | ICD-10-CM | POA: Diagnosis not present

## 2023-12-28 DIAGNOSIS — C3490 Malignant neoplasm of unspecified part of unspecified bronchus or lung: Secondary | ICD-10-CM | POA: Diagnosis present

## 2023-12-28 DIAGNOSIS — R0602 Shortness of breath: Secondary | ICD-10-CM | POA: Diagnosis present

## 2023-12-28 DIAGNOSIS — E785 Hyperlipidemia, unspecified: Secondary | ICD-10-CM | POA: Diagnosis present

## 2023-12-28 DIAGNOSIS — E890 Postprocedural hypothyroidism: Secondary | ICD-10-CM | POA: Diagnosis present

## 2023-12-28 DIAGNOSIS — I252 Old myocardial infarction: Secondary | ICD-10-CM | POA: Diagnosis not present

## 2023-12-28 DIAGNOSIS — R197 Diarrhea, unspecified: Secondary | ICD-10-CM | POA: Diagnosis present

## 2023-12-28 DIAGNOSIS — I10 Essential (primary) hypertension: Secondary | ICD-10-CM | POA: Diagnosis present

## 2023-12-28 DIAGNOSIS — F17210 Nicotine dependence, cigarettes, uncomplicated: Secondary | ICD-10-CM | POA: Diagnosis present

## 2023-12-28 DIAGNOSIS — D638 Anemia in other chronic diseases classified elsewhere: Secondary | ICD-10-CM | POA: Diagnosis present

## 2023-12-28 DIAGNOSIS — Z886 Allergy status to analgesic agent status: Secondary | ICD-10-CM

## 2023-12-28 DIAGNOSIS — Z87442 Personal history of urinary calculi: Secondary | ICD-10-CM

## 2023-12-28 DIAGNOSIS — Z90710 Acquired absence of both cervix and uterus: Secondary | ICD-10-CM | POA: Diagnosis not present

## 2023-12-28 DIAGNOSIS — Z79899 Other long term (current) drug therapy: Secondary | ICD-10-CM

## 2023-12-28 DIAGNOSIS — Z66 Do not resuscitate: Secondary | ICD-10-CM | POA: Diagnosis present

## 2023-12-28 DIAGNOSIS — F419 Anxiety disorder, unspecified: Secondary | ICD-10-CM | POA: Diagnosis present

## 2023-12-28 DIAGNOSIS — Z1152 Encounter for screening for COVID-19: Secondary | ICD-10-CM | POA: Diagnosis not present

## 2023-12-28 DIAGNOSIS — Z902 Acquired absence of lung [part of]: Secondary | ICD-10-CM | POA: Diagnosis not present

## 2023-12-28 DIAGNOSIS — I2510 Atherosclerotic heart disease of native coronary artery without angina pectoris: Secondary | ICD-10-CM | POA: Diagnosis present

## 2023-12-28 DIAGNOSIS — K219 Gastro-esophageal reflux disease without esophagitis: Secondary | ICD-10-CM | POA: Diagnosis present

## 2023-12-28 DIAGNOSIS — Z7901 Long term (current) use of anticoagulants: Secondary | ICD-10-CM

## 2023-12-28 DIAGNOSIS — F32A Depression, unspecified: Secondary | ICD-10-CM | POA: Diagnosis present

## 2023-12-28 DIAGNOSIS — Z7951 Long term (current) use of inhaled steroids: Secondary | ICD-10-CM

## 2023-12-28 DIAGNOSIS — G4730 Sleep apnea, unspecified: Secondary | ICD-10-CM | POA: Diagnosis present

## 2023-12-28 DIAGNOSIS — J439 Emphysema, unspecified: Secondary | ICD-10-CM | POA: Diagnosis present

## 2023-12-28 LAB — CBC WITH DIFFERENTIAL/PLATELET
Abs Immature Granulocytes: 0.08 10*3/uL — ABNORMAL HIGH (ref 0.00–0.07)
Basophils Absolute: 0 10*3/uL (ref 0.0–0.1)
Basophils Relative: 0 %
Eosinophils Absolute: 0.1 10*3/uL (ref 0.0–0.5)
Eosinophils Relative: 0 %
HCT: 41.8 % (ref 36.0–46.0)
Hemoglobin: 12.5 g/dL (ref 12.0–15.0)
Immature Granulocytes: 1 %
Lymphocytes Relative: 6 %
Lymphs Abs: 0.9 10*3/uL (ref 0.7–4.0)
MCH: 29.3 pg (ref 26.0–34.0)
MCHC: 29.9 g/dL — ABNORMAL LOW (ref 30.0–36.0)
MCV: 97.9 fL (ref 80.0–100.0)
Monocytes Absolute: 0.4 10*3/uL (ref 0.1–1.0)
Monocytes Relative: 2 %
Neutro Abs: 13.5 10*3/uL — ABNORMAL HIGH (ref 1.7–7.7)
Neutrophils Relative %: 91 %
Platelets: 237 10*3/uL (ref 150–400)
RBC: 4.27 MIL/uL (ref 3.87–5.11)
RDW: 14.1 % (ref 11.5–15.5)
WBC: 14.9 10*3/uL — ABNORMAL HIGH (ref 4.0–10.5)
nRBC: 0 % (ref 0.0–0.2)

## 2023-12-28 LAB — TROPONIN I (HIGH SENSITIVITY)
Troponin I (High Sensitivity): 10 ng/L (ref <18)
Troponin I (High Sensitivity): 7 ng/L (ref <18)

## 2023-12-28 LAB — COMPREHENSIVE METABOLIC PANEL WITH GFR
ALT: 15 U/L (ref 0–44)
AST: 17 U/L (ref 15–41)
Albumin: 3.4 g/dL — ABNORMAL LOW (ref 3.5–5.0)
Alkaline Phosphatase: 55 U/L (ref 38–126)
Anion gap: 8 (ref 5–15)
BUN: 14 mg/dL (ref 8–23)
CO2: 30 mmol/L (ref 22–32)
Calcium: 8.7 mg/dL — ABNORMAL LOW (ref 8.9–10.3)
Chloride: 105 mmol/L (ref 98–111)
Creatinine, Ser: 0.58 mg/dL (ref 0.44–1.00)
GFR, Estimated: >60 mL/min (ref >60)
Glucose, Bld: 134 mg/dL — ABNORMAL HIGH (ref 70–99)
Potassium: 3.9 mmol/L (ref 3.5–5.1)
Sodium: 143 mmol/L (ref 135–145)
Total Bilirubin: 0.3 mg/dL (ref 0.0–1.2)
Total Protein: 6.4 g/dL — ABNORMAL LOW (ref 6.5–8.1)

## 2023-12-28 LAB — RESP PANEL BY RT-PCR (RSV, FLU A&B, COVID)  RVPGX2
Influenza A by PCR: NEGATIVE
Influenza B by PCR: NEGATIVE
Resp Syncytial Virus by PCR: NEGATIVE
SARS Coronavirus 2 by RT PCR: NEGATIVE

## 2023-12-28 LAB — TSH: TSH: 2.694 u[IU]/mL (ref 0.350–4.500)

## 2023-12-28 LAB — BRAIN NATRIURETIC PEPTIDE: B Natriuretic Peptide: 76.4 pg/mL (ref 0.0–100.0)

## 2023-12-28 LAB — MAGNESIUM: Magnesium: 1.7 mg/dL (ref 1.7–2.4)

## 2023-12-28 MED ORDER — LOSARTAN POTASSIUM 25 MG PO TABS: 12.5000 mg | ORAL_TABLET | Freq: Every day | ORAL | Status: DC

## 2023-12-28 MED ORDER — APIXABAN 5 MG PO TABS
5.0000 mg | ORAL_TABLET | Freq: Two times a day (BID) | ORAL | Status: DC
  Administered 2023-12-28 – 2023-12-30 (×4): 5 mg via ORAL
  Filled 2023-12-28 (×4): qty 1

## 2023-12-28 MED ORDER — ACETAMINOPHEN 650 MG RE SUPP: 650.0000 mg | Freq: Four times a day (QID) | RECTAL | Status: DC | PRN

## 2023-12-28 MED ORDER — IPRATROPIUM-ALBUTEROL 0.5-2.5 (3) MG/3ML IN SOLN
3.0000 mL | Freq: Once | RESPIRATORY_TRACT | Status: AC
  Administered 2023-12-28: 3 mL via RESPIRATORY_TRACT
  Filled 2023-12-28: qty 3

## 2023-12-28 MED ORDER — MAGNESIUM OXIDE -MG SUPPLEMENT 400 (240 MG) MG PO TABS
400.0000 mg | ORAL_TABLET | Freq: Every day | ORAL | Status: DC
  Administered 2023-12-28 – 2023-12-30 (×3): 400 mg via ORAL
  Filled 2023-12-28 (×3): qty 1

## 2023-12-28 MED ORDER — OXYCODONE HCL 5 MG PO TABS
5.0000 mg | ORAL_TABLET | Freq: Four times a day (QID) | ORAL | Status: DC | PRN
  Administered 2023-12-29: 5 mg via ORAL
  Filled 2023-12-28: qty 1

## 2023-12-28 MED ORDER — ALBUTEROL SULFATE (2.5 MG/3ML) 0.083% IN NEBU
2.5000 mg | INHALATION_SOLUTION | Freq: Once | RESPIRATORY_TRACT | Status: AC
  Administered 2023-12-28: 2.5 mg via RESPIRATORY_TRACT
  Filled 2023-12-28: qty 3

## 2023-12-28 MED ORDER — BENZONATATE 100 MG PO CAPS
100.0000 mg | ORAL_CAPSULE | Freq: Three times a day (TID) | ORAL | Status: DC | PRN
  Administered 2023-12-29 – 2023-12-30 (×2): 100 mg via ORAL
  Filled 2023-12-28 (×2): qty 1

## 2023-12-28 MED ORDER — PREDNISOLONE ACETATE 1 % OP SUSP
1.0000 [drp] | Freq: Four times a day (QID) | OPHTHALMIC | Status: DC
  Administered 2023-12-29 – 2023-12-30 (×4): 1 [drp] via OPHTHALMIC
  Filled 2023-12-28 (×2): qty 5

## 2023-12-28 MED ORDER — GATIFLOXACIN 0.5 % OP SOLN
1.0000 [drp] | Freq: Four times a day (QID) | OPHTHALMIC | Status: DC
  Administered 2023-12-29 – 2023-12-30 (×4): 1 [drp] via OPHTHALMIC
  Filled 2023-12-28 (×3): qty 2.5

## 2023-12-28 MED ORDER — METOPROLOL SUCCINATE ER 25 MG PO TB24
12.5000 mg | ORAL_TABLET | Freq: Every day | ORAL | Status: DC
  Administered 2023-12-29 – 2023-12-30 (×2): 12.5 mg via ORAL
  Filled 2023-12-28 (×2): qty 1

## 2023-12-28 MED ORDER — IPRATROPIUM-ALBUTEROL 0.5-2.5 (3) MG/3ML IN SOLN
3.0000 mL | RESPIRATORY_TRACT | Status: AC
  Administered 2023-12-28 – 2023-12-29 (×3): 3 mL via RESPIRATORY_TRACT
  Filled 2023-12-28 (×3): qty 3

## 2023-12-28 MED ORDER — ALBUTEROL SULFATE (2.5 MG/3ML) 0.083% IN NEBU
2.5000 mg | INHALATION_SOLUTION | RESPIRATORY_TRACT | Status: DC | PRN
  Administered 2023-12-30: 2.5 mg via RESPIRATORY_TRACT
  Filled 2023-12-28: qty 3

## 2023-12-28 MED ORDER — METHYLPREDNISOLONE SODIUM SUCC 125 MG IJ SOLR
125.0000 mg | Freq: Two times a day (BID) | INTRAMUSCULAR | Status: AC
  Administered 2023-12-29 (×2): 125 mg via INTRAVENOUS
  Filled 2023-12-28 (×2): qty 2

## 2023-12-28 MED ORDER — ACETAMINOPHEN 325 MG PO TABS: 650.0000 mg | ORAL_TABLET | Freq: Four times a day (QID) | ORAL | Status: DC | PRN

## 2023-12-28 MED ORDER — LEVOTHYROXINE SODIUM 25 MCG PO TABS
125.0000 ug | ORAL_TABLET | Freq: Every day | ORAL | Status: DC
  Administered 2023-12-29 – 2023-12-30 (×2): 125 ug via ORAL
  Filled 2023-12-28 (×2): qty 1

## 2023-12-28 MED ORDER — PREDNISONE 20 MG PO TABS
40.0000 mg | ORAL_TABLET | Freq: Every day | ORAL | Status: DC
  Administered 2023-12-30: 40 mg via ORAL
  Filled 2023-12-28: qty 2

## 2023-12-28 MED ORDER — ROSUVASTATIN CALCIUM 20 MG PO TABS
20.0000 mg | ORAL_TABLET | Freq: Every day | ORAL | Status: DC
  Administered 2023-12-29: 20 mg via ORAL
  Filled 2023-12-28 (×2): qty 1

## 2023-12-28 MED ORDER — BUDESON-GLYCOPYRROL-FORMOTEROL 160-9-4.8 MCG/ACT IN AERO
1.0000 | INHALATION_SPRAY | Freq: Two times a day (BID) | RESPIRATORY_TRACT | Status: DC
  Administered 2023-12-29 – 2023-12-30 (×4): 1 via RESPIRATORY_TRACT
  Filled 2023-12-28: qty 5.9

## 2023-12-28 MED ORDER — SERTRALINE HCL 25 MG PO TABS
25.0000 mg | ORAL_TABLET | Freq: Every day | ORAL | Status: DC
  Administered 2023-12-28 – 2023-12-30 (×3): 25 mg via ORAL
  Filled 2023-12-28 (×3): qty 1

## 2023-12-28 MED ORDER — IPRATROPIUM-ALBUTEROL 0.5-2.5 (3) MG/3ML IN SOLN: 3.0000 mL | RESPIRATORY_TRACT | Status: DC | PRN

## 2023-12-28 MED ORDER — LACTATED RINGERS IV BOLUS
1000.0000 mL | Freq: Once | INTRAVENOUS | Status: AC
  Administered 2023-12-28: 1000 mL via INTRAVENOUS

## 2023-12-28 MED ORDER — POLYVINYL ALCOHOL 1.4 % OP SOLN
1.0000 [drp] | Freq: Four times a day (QID) | OPHTHALMIC | Status: DC
  Administered 2023-12-29 – 2023-12-30 (×5): 1 [drp] via OPHTHALMIC
  Filled 2023-12-28 (×2): qty 15

## 2023-12-28 NOTE — Assessment & Plan Note (Signed)
 HR controlled.  Continue eliquis  and metoprolol 

## 2023-12-28 NOTE — ED Provider Notes (Signed)
 [place] EMERGENCY DEPARTMENT AT [facility] Provider Note   CSN: [id number] Arrival date & time: 12/28/23  1558     History  Chief Complaint  Patient presents with   Shortness of Breath    Tracy Park is a 68 y.o. female.  HPI 69 year old female with a history of COPD and lung cancer who is chronically on 2 L of oxygen  presents with shortness of breath.  She was discharged from the hospital about a week ago and has been feeling short of breath since.  However the shortness of breath got significantly worse last night and today.  EMS gave her albuterol , Atrovent , and IV Solu-Medrol .  She is also having chest pain that feels like a squeezing.  She states she does get this typically with her COPD exacerbations.  She denies any fever or change in her chronic cough.  She denies any leg swelling.  She has had a few episodes of diarrhea today.  Home Medications Prior to Admission medications   Medication Sig Start Date End Date Taking? Authorizing Provider  albuterol  (PROVENTIL ) (2.5 MG/3ML) 0.083% nebulizer solution Take 3 mLs (2.5 mg total) by nebulization every 4 (four) hours while awake for 3 days, THEN 3 mLs (2.5 mg total) every 4 (four) hours as needed for wheezing or shortness of breath. Patient taking differently: Inhale 3 mLs (2.5 mg total) every 4 (four) hours as needed for wheezing or shortness of breath. 10/18/23 12/28/23 Yes Dalene Duck, MD  albuterol  (VENTOLIN  HFA) [phone] Base) MCG/ACT inhaler Inhale 2 puffs into the lungs every 6 (six) hours as needed for wheezing or shortness of breath. 11/07/23  Yes Amin, Ankit C, MD  apixaban  (ELIQUIS ) 5 MG TABS tablet Take 1 tablet (5 mg total) by mouth 2 (two) times daily. 06/06/23  Yes Lorita Rosa, MD  benzonatate  (TESSALON ) 100 MG capsule Take 1 capsule (100 mg total) by mouth 3 (three) times daily as needed for cough. 12/19/23  Yes Magdalene School, MD  Budeson-Glycopyrrol-Formoterol  (BREZTRI  AEROSPHERE) 160-9-4.8  MCG/ACT AERO Inhale 1 puff into the lungs in the morning and at bedtime.   Yes [provider]  doxycycline  (VIBRA -TABS) 100 MG tablet Take 100 mg by mouth 2 (two) times daily. For 10 days 12/26/23 01/05/24 Yes [provider]  levothyroxine  (SYNTHROID ) 125 MCG tablet Take 125 mcg by mouth daily before breakfast.   Yes [provider]  losartan  (COZAAR ) 25 MG tablet Take 12.5 mg by mouth daily. 08/29/23  Yes [provider]  magnesium  oxide (MAG-OX) 400 (240 Mg) MG tablet Take 400 mg by mouth in the morning.   Yes [provider]  metoprolol  succinate (TOPROL -XL) 25 MG 24 hr tablet Take 0.5 tablets (12.5 mg total) by mouth daily. 06/22/23 12/28/23 Yes Verlyn Goad, MD  moxifloxacin (VIGAMOX) 0.5 % ophthalmic solution Place 1 drop into the left eye 4 (four) times daily. 11/19/23  Yes [provider]  nitroGLYCERIN  (NITROSTAT ) 0.4 MG SL tablet Place 0.4 mg under the tongue every 5 (five) minutes as needed for chest pain.   Yes [provider]  oxyCODONE  (ROXICODONE ) 5 MG immediate release tablet Take 1 tablet (5 mg total) by mouth every 6 (six) hours as needed for severe pain (pain score 7-10). 12/03/23  Yes Curatolo, Adam, DO  prednisoLONE  acetate (PRED FORTE ) 1 % ophthalmic suspension Place 1 drop into the left eye 4 (four) times daily. 11/19/23  Yes [provider]  predniSONE  (DELTASONE ) 20 MG tablet Take 40 mg by  mouth daily with breakfast. For 3 days. 12/25/23 12/28/23 Yes [provider]  rosuvastatin  (CRESTOR ) 20 MG tablet Take 20 mg by mouth daily.   Yes [provider]  sertraline  (ZOLOFT ) 25 MG tablet Take 25 mg by mouth in the morning.   Yes [provider]  SYSTANE COMPLETE PF 0.6 % SOLN Place 1 drop into both eyes 4 (four) times daily.   Yes [provider]  TYLENOL  8 HOUR 650 MG CR tablet Take 650-1,300 mg by mouth 2 (two) times daily as needed for pain.   Yes [provider]   OXYGEN  Inhale 2 L/min into the lungs continuous.    [provider]      Allergies    Red dye #40 (allura red), Strawberry extract, Tomato, Aspirin, Tape, and Wound dressing adhesive    Review of Systems   Review of Systems  Constitutional:  Negative for fever.  Respiratory:  Positive for shortness of breath and wheezing.   Cardiovascular:  Positive for chest pain. Negative for leg swelling.  Gastrointestinal:  Positive for diarrhea. Negative for abdominal pain.    Physical Exam Updated Vital Signs BP 104/69   Pulse 90   Temp 98.5 F (36.9 C) (Oral)   Resp (!) 26   Ht 5\' 3"  (1.6 m)   Wt 45.4 kg   SpO2 97%   BMI 17.71 kg/m  Physical Exam Vitals and nursing note reviewed.  Constitutional:      General: She is not in acute distress.    Appearance: She is well-developed. She is not ill-appearing or diaphoretic.  HENT:     Head: Normocephalic and atraumatic.  Cardiovascular:     Rate and Rhythm: Regular rhythm. Tachycardia present.     Heart sounds: Normal heart sounds.     Comments: HR~100 Pulmonary:     Effort: Pulmonary effort is normal. Tachypnea present. No accessory muscle usage.     Breath sounds: Wheezing present.  Abdominal:     Palpations: Abdomen is soft.     Tenderness: There is no abdominal tenderness.  Musculoskeletal:     Right lower leg: No edema.     Left lower leg: No edema.  Skin:    General: Skin is warm and dry.  Neurological:     Mental Status: She is alert.     ED Results / Procedures / Treatments   Labs (all labs ordered are listed, but only abnormal results are displayed) Labs Reviewed  COMPREHENSIVE METABOLIC PANEL WITH GFR - Abnormal; Notable for the following components:      Result Value   Glucose, Bld 134 (*)    Calcium  8.7 (*)    Total Protein 6.4 (*)    Albumin 3.4 (*)    All other components within normal limits  CBC WITH DIFFERENTIAL/PLATELET - Abnormal; Notable for the following components:   WBC 14.9 (*)     MCHC 29.9 (*)    Neutro Abs 13.5 (*)    Abs Immature Granulocytes 0.08 (*)    All other components within normal limits  RESP PANEL BY RT-PCR (RSV, FLU A&B, COVID)  RVPGX2  MAGNESIUM   BRAIN NATRIURETIC PEPTIDE  TSH  TROPONIN I (HIGH SENSITIVITY)  TROPONIN I (HIGH SENSITIVITY)    EKG EKG Interpretation Date/Time:  Sunday December 28 2023 16:37:22 EDT Ventricular Rate:  95 PR Interval:  122 QRS Duration:  105 QT Interval:  384 QTC Calculation: 483 R Axis:   -51  Text Interpretation: Sinus rhythm Right atrial enlargement Incomplete RBBB  and LAFB Consider right ventricular hypertrophy  rate is faster, otherwise ECG is similar to December 17 2023 Confirmed by Jerilynn Montenegro [phone]) on 12/28/2023 4:51:36 PM  Radiology DG Chest 2 View Result Date: 12/28/2023 CLINICAL DATA:  Shortness of breath for 1 day. History of lung cancer. EXAM: CHEST - 2 VIEW COMPARISON:  Radiograph 12/12/2023 and CTA chest 12/13/2023 FINDINGS: Chronic interstitial coarsening and hyperinflation of the right lung. Chronic scarring/volume loss of the left upper lung with leftward deviation of the trachea. No focal consolidation. Chronic blunting of the left costophrenic angle. No pneumothorax. Stable cardiomediastinal silhouette. No displaced rib fractures. IMPRESSION: Emphysema.  Chronic volume loss/scarring in the left upper lung. Electronically Signed   By: Rozell Cornet M.D.   On: 12/28/2023 17:28    Procedures Procedures    Medications Ordered in ED Medications  lactated ringers  bolus 1,000 mL (0 mLs Intravenous Stopped 12/28/23 1922)  ipratropium-albuterol  (DUONEB) 0.5-2.5 (3) MG/3ML nebulizer solution 3 mL (3 mLs Nebulization Given 12/28/23 1735)  albuterol  (PROVENTIL ) (2.5 MG/3ML) 0.083% nebulizer solution 2.5 mg (2.5 mg Nebulization Given 12/28/23 1735)  albuterol  (PROVENTIL ) (2.5 MG/3ML) 0.083% nebulizer solution 2.5 mg (2.5 mg Nebulization Given 12/28/23 1925)  ipratropium-albuterol  (DUONEB) 0.5-2.5 (3) MG/3ML  nebulizer solution 3 mL (3 mLs Nebulization Given 12/28/23 1926)    ED Course/ Medical Decision Making/ A&P                                 Medical Decision Making Amount and/or Complexity of Data Reviewed Labs: ordered.    Details: Leukocytosis of 14.9 Radiology: ordered and independent interpretation performed.    Details: No pneumonia ECG/medicine tests: ordered and independent interpretation performed.    Details: No ischemia  Risk Prescription drug management. Decision regarding hospitalization.   Patient presents with a recurrent COPD exacerbation.  She was given DuoNebs and had previously already been given Solu-Medrol  by EMS.  She is slowly improving but still tachypneic and short of breath.  Still having some wheezing.  Will need admission for further management and supportive care.  Discussed with Dr. Francesco Inks for admission.  She is on Eliquis  and had a similar presentation a couple weeks ago and had a negative CTA.  My suspicion for PE is pretty low.        Final Clinical Impression(s) / ED Diagnoses Final diagnoses:  COPD exacerbation [facility])    Rx / DC Orders ED Discharge Orders     None         Jerilynn Montenegro, MD 12/28/23 2039

## 2023-12-28 NOTE — Assessment & Plan Note (Signed)
 Continue Zoloft

## 2023-12-28 NOTE — Assessment & Plan Note (Signed)
Continue synthroid 125 mcg

## 2023-12-28 NOTE — Assessment & Plan Note (Signed)
 Continue Crestor

## 2023-12-28 NOTE — Assessment & Plan Note (Addendum)
 Continue medical management Troponin wnl

## 2023-12-28 NOTE — H&P (Signed)
 History and Physical    Patient: Tracy Park UEA:[id number] DOB: 09/26/1955 DOA: 12/28/2023 DOS: the patient was seen and examined on 12/28/2023 PCP: Chana Comas, FNP  Patient coming from: Home - lives with her sister. Ambulates independently    Chief Complaint: shortness of breath/cough   HPI: Tracy Park is a 68 y.o. female with medical history significant of  lung cancer stage III status post lobectomy in 2018, COPD, chronic respiratory failure 2L nasal cannula baseline, bipolar disorder, paroxysmal atrial fibrillation, hypothyroidism who presented to ED with complaints of worsening shortness of breath and cough x 2-3 days. She states her cough has been dry and her shortness of breath has gotten progressively worse despite breathing treatments. She feels very tight and wheezy. She increased her oxygen  with no improvement at home. She continues to smoke. Last night woke up in a sweat, but no fever. She denies any fever/chills or sick contacts.   Admitted 4/4-4/11 for acute COPD exacerbation with acute on chronic respiratory failure.   Denies any fever/chills, vision changes/headaches, chest pain or palpitations, abdominal pain, N/V/D, dysuria or leg swelling.    She smokes daily, does not drink alcohol .   ER Course:  vitals: afebrile, bp: 123/73, HR: 104, RR: 32, oxygen : 100% on NRB Pertinent labs: wbc: 14.9,  CXR: emphysema, chronic volume loss/scarring in the left upper lung.  In ED: with EMS given 125mg  solumedrol, 10mg  albuterol , 2 duonebs. Given 1L IVF bolus in ED and nebs. [facility] asked to admit.    Review of Systems: As mentioned in the history of present illness. All other systems reviewed and are negative. Past Medical History:  Diagnosis Date   Anginal pain (HCC)    Anxiety    Bipolar disorder (HCC)    Cancer (HCC)    COPD (chronic obstructive pulmonary disease) (HCC)    Dyspnea    Family history of adverse reaction to anesthesia    History of kidney stones     Hydroureteronephrosis 08/16/2021   Hypothyroidism    Lung cancer (HCC)    Myocardial infarction (HCC)    Paroxysmal atrial fibrillation (HCC)    PTSD (post-traumatic stress disorder)    Sleep apnea    Thyroid  disease    Past Surgical History:  Procedure Laterality Date   ABDOMINAL HYSTERECTOMY     BACK SURGERY     CYSTOSCOPY W/ URETERAL STENT PLACEMENT Right 05/03/2021   Procedure: CYSTOSCOPY WITH RETROGRADE PYELOGRAM/URETERAL STENT PLACEMENT;  Surgeon: Andrez Banker, MD;  Location: [facility]S;  Service: Urology;  Laterality: Right;   EYE SURGERY     kidney stent     thyroidectomy     Social History:  reports that she has been smoking cigarettes. She has never used smokeless tobacco. She reports that she does not currently use alcohol . She reports that she does not currently use drugs.  Allergies  Allergen Reactions   Red Dye #40 (Allura Red) Hives, Itching and Other (See Comments)    Red food dye   Strawberry Extract Hives and Itching   Tomato Hives and Itching   Aspirin Hives   Tape Rash and Other (See Comments)    Prefers paper tape   Wound Dressing Adhesive Rash    Family History  Problem Relation Age of Onset   Hypertension Mother    Heart failure Mother    Hypertension Father    Diabetes Father    Heart failure Father     Prior to Admission medications   Medication Sig Start Date  End Date Taking? Authorizing Provider  albuterol  (PROVENTIL ) (2.5 MG/3ML) 0.083% nebulizer solution Take 3 mLs (2.5 mg total) by nebulization every 4 (four) hours while awake for 3 days, THEN 3 mLs (2.5 mg total) every 4 (four) hours as needed for wheezing or shortness of breath. Patient taking differently: Inhale 3 mLs (2.5 mg total) every 4 (four) hours as needed for wheezing or shortness of breath. 10/18/23 12/13/23  Dalene Duck, MD  albuterol  (VENTOLIN  HFA) 108 [phone] Base) MCG/ACT inhaler Inhale 2 puffs into the lungs every 6 (six) hours as needed for wheezing or shortness of breath.  11/07/23   Amin, Ankit C, MD  apixaban  (ELIQUIS ) 5 MG TABS tablet Take 1 tablet (5 mg total) by mouth 2 (two) times daily. 06/06/23   Lorita Rosa, MD  benzonatate  (TESSALON ) 100 MG capsule Take 1 capsule (100 mg total) by mouth 3 (three) times daily as needed for cough. 12/19/23   Magdalene School, MD  Budeson-Glycopyrrol-Formoterol  (BREZTRI  AEROSPHERE) 160-9-4.8 MCG/ACT AERO Inhale 1 puff into the lungs in the morning and at bedtime.    [provider]  levothyroxine  (SYNTHROID ) 125 MCG tablet Take 125 mcg by mouth daily before breakfast.    [provider]  losartan  (COZAAR ) 25 MG tablet Take 12.5 mg by mouth daily. 08/29/23   [provider]  magnesium  oxide (MAG-OX) 400 (240 Mg) MG tablet Take 400 mg by mouth in the morning.    [provider]  metoprolol  succinate (TOPROL -XL) 25 MG 24 hr tablet Take 0.5 tablets (12.5 mg total) by mouth daily. 06/22/23 12/13/23  Verlyn Goad, MD  moxifloxacin (VIGAMOX) 0.5 % ophthalmic solution Place 1 drop into the left eye 4 (four) times daily. 11/19/23   [provider]  nitroGLYCERIN  (NITROSTAT ) 0.4 MG SL tablet Place 0.4 mg under the tongue every 5 (five) minutes as needed for chest pain.    [provider]  oxyCODONE  (ROXICODONE ) 5 MG immediate release tablet Take 1 tablet (5 mg total) by mouth every 6 (six) hours as needed for severe pain (pain score 7-10). 12/03/23   Curatolo, Adam, DO  OXYGEN  Inhale 2 L/min into the lungs continuous.    [provider]  prednisoLONE  acetate (PRED FORTE ) 1 % ophthalmic suspension Place 1 drop into the left eye 4 (four) times daily. 11/19/23   [provider]  rosuvastatin  (CRESTOR ) 20 MG tablet Take 20 mg by mouth daily.    [provider]  sertraline  (ZOLOFT ) 25 MG tablet Take 25 mg by mouth in the morning.    [provider]  SYSTANE COMPLETE PF 0.6 % SOLN Place 1 drop into both eyes 4 (four) times daily.    [provider]   TYLENOL  8 HOUR 650 MG CR tablet Take 650-1,300 mg by mouth 2 (two) times daily as needed for pain.    [provider]    Physical Exam: Vitals:   12/28/23 1621 12/28/23 1645 12/28/23 1730 12/28/23 2030  BP:  107/66 104/69 106/62  Pulse:  (!) 102 90 87  Resp:  (!) 29 (!) 26 20  Temp:      TempSrc:      SpO2:  99% 97% 100%  Weight: 45.4 kg     Height: 5\' 3"  (1.6 m)      General:  Appears calm and comfortable and is in NAD Eyes:  PERRL, EOMI, normal lids, iris ENT:  grossly normal hearing, lips & tongue, mmm; appropriate dentition Neck:  no LAD, masses or thyromegaly; no  carotid bruits Cardiovascular:  RRR, no m/r/g. No LE edema.  Respiratory:   inspiratory and expiratory wheezing >upper lobes. Moving good air. No crackles. Normal effort.  Abdomen:  soft, NT, ND, NABS Back:   normal alignment, no CVAT Skin:  no rash or induration seen on limited exam Musculoskeletal:  grossly normal tone BUE/BLE, good ROM, no bony abnormality Lower extremity:  No LE edema.  Limited foot exam with no ulcerations.  2+ distal pulses. Psychiatric:  grossly normal mood and affect, speech fluent and appropriate, AOx3 Neurologic:  CN 2-12 grossly intact, moves all extremities in coordinated fashion, sensation intact   Radiological Exams on Admission: Independently reviewed - see discussion in A/P where applicable  DG Chest 2 View Result Date: 12/28/2023 CLINICAL DATA:  Shortness of breath for 1 day. History of lung cancer. EXAM: CHEST - 2 VIEW COMPARISON:  Radiograph 12/12/2023 and CTA chest 12/13/2023 FINDINGS: Chronic interstitial coarsening and hyperinflation of the right lung. Chronic scarring/volume loss of the left upper lung with leftward deviation of the trachea. No focal consolidation. Chronic blunting of the left costophrenic angle. No pneumothorax. Stable cardiomediastinal silhouette. No displaced rib fractures. IMPRESSION: Emphysema.  Chronic volume loss/scarring in the left upper lung.  Electronically Signed   By: Rozell Cornet M.D.   On: 12/28/2023 17:28    EKG: Independently reviewed.  Sinus tachycardia with rate 105; nonspecific ST changes with no evidence of acute ischemia   Labs on Admission: I have personally reviewed the available labs and imaging studies at the time of the admission.  Pertinent labs:   wbc: 14.9  Assessment and Plan: Principal Problem:   COPD with acute exacerbation and acute on chronic respiratory failure with hypoxia Active Problems:   Acute on chronic respiratory failure with hypoxia (HCC)   Essential hypertension   Paroxysmal atrial fibrillation (HCC)   Adenocarcinoma of lung (HCC)   Coronary artery disease involving native coronary artery of native heart without angina pectoris   Hypothyroidism   Hyperlipidemia   Anxiety and depression and bipolar   Bipolar disorder (HCC)    Assessment and Plan: * COPD with acute exacerbation and acute on chronic respiratory failure with hypoxia 68 year old presenting with 2-3 day history of worsening shortness of breath, tightness and wheezing found to be needing 5L oxygen  from her baseline of 2L due to COPD exacerbation.  -admit to progressive -covid/flu/rsv negative.  -troponin wnl x2, bnp wnl  -COPD protocol  -has been on oral prednisone , continue solumedrol for another day  -scheduled duonebs -PRN SABA -continue home Breztri   -weaned back down to 2L oxygen   -no increased viscosity/sputum  -just hospitalized for COPD exacerbation a week ago -still smoking, encouraged cessation  -concern for possible recurrence of lung cancer in her RUL>unsure if contributing. PET scan and oncology appointment pending -followed by pulm  -IS to bedside -continue tessalon  pearls, she likes these   Essential hypertension Pressures soft. Hold her losartan  Continue metoprolol    Paroxysmal atrial fibrillation (HCC) HR controlled.  Continue eliquis  and metoprolol    Adenocarcinoma of lung (HCC) Stage  II[facility] LUL lung adenocarcinoma , s/p left upper lobectomy 2018 and adjunct chemo complete in 2019  -CT: 11/2023: Increasing subpleural consolidation within the right upper lobe  abutting the major fissure, measuring up to 2.1 x 0.9 cm  -concern for recurrence. PET scan ordered -no showed her oncology appointment on 12/10/23 >rescheduled on 4/28   Coronary artery disease involving native coronary artery of native heart without angina pectoris Continue medical management Troponin  wnl   Hypothyroidism Continue synthroid   Hyperlipidemia Continue Crestor .   Anxiety and depression and bipolar Continue Zoloft .     Advance Care Planning:   Code Status: Limited: Do not attempt resuscitation (DNR) -DNR-LIMITED -Do Not Intubate/DNI  dnr/dni   Consults: none   DVT Prophylaxis: eliquis    Family Communication: none   Severity of Illness: The appropriate patient status for this patient is INPATIENT. Inpatient status is judged to be reasonable and necessary in order to provide the required intensity of service to ensure the patient's safety. The patient's presenting symptoms, physical exam findings, and initial radiographic and laboratory data in the context of their chronic comorbidities is felt to place them at high risk for further clinical deterioration. Furthermore, it is not anticipated that the patient will be medically stable for discharge from the hospital within 2 midnights of admission.   * I certify that at the point of admission it is my clinical judgment that the patient will require inpatient hospital care spanning beyond 2 midnights from the point of admission due to high intensity of service, high risk for further deterioration and high frequency of surveillance required.*  Author: Raymona Caldwell, MD 12/28/2023 9:53 PM  For on call review [email or web address].

## 2023-12-28 NOTE — Assessment & Plan Note (Addendum)
 68 year old presenting with 2-3 day history of worsening shortness of breath, tightness and wheezing found to be needing 5L oxygen  from her baseline of 2L due to COPD exacerbation.  -admit to progressive -covid/flu/rsv negative.  -troponin wnl x2, bnp wnl  -COPD protocol  -has been on oral prednisone , continue solumedrol for another day  -scheduled duonebs -PRN SABA -continue home Breztri   -weaned back down to 2L oxygen   -no increased viscosity/sputum  -just hospitalized for COPD exacerbation a week ago -still smoking, encouraged cessation  -concern for possible recurrence of lung cancer in her RUL>unsure if contributing. PET scan and oncology appointment pending -followed by pulm  -IS to bedside -continue tessalon  pearls, she likes these

## 2023-12-28 NOTE — Assessment & Plan Note (Addendum)
 Pressures soft. Hold her losartan  Continue metoprolol 

## 2023-12-28 NOTE — Assessment & Plan Note (Addendum)
 Stage IIIA LUL lung adenocarcinoma , s/p left upper lobectomy 2018 and adjunct chemo complete in 2019  -CT: 11/2023: Increasing subpleural consolidation within the right upper lobe  abutting the major fissure, measuring up to 2.1 x 0.9 cm  -concern for recurrence. PET scan ordered -no showed her oncology appointment on 12/10/23 >rescheduled on 4/28

## 2023-12-28 NOTE — ED Triage Notes (Signed)
 Pt BIBA from home. C/o worsening SOB for 1x day. Hx lung cancer.  Given 125 mg solumedrol IV 10 mg albuterol  2 duonebs 1 mg atrovent  By EMS  Aox4  On 3L [place] at baseline

## 2023-12-29 DIAGNOSIS — J441 Chronic obstructive pulmonary disease with (acute) exacerbation: Secondary | ICD-10-CM | POA: Diagnosis not present

## 2023-12-29 LAB — BASIC METABOLIC PANEL WITH GFR
Anion gap: 5 (ref 5–15)
BUN: 21 mg/dL (ref 8–23)
CO2: 33 mmol/L — ABNORMAL HIGH (ref 22–32)
Calcium: 8.6 mg/dL — ABNORMAL LOW (ref 8.9–10.3)
Chloride: 99 mmol/L (ref 98–111)
Creatinine, Ser: 0.51 mg/dL (ref 0.44–1.00)
GFR, Estimated: >60 mL/min (ref >60)
Glucose, Bld: 196 mg/dL — ABNORMAL HIGH (ref 70–99)
Potassium: 4.4 mmol/L (ref 3.5–5.1)
Sodium: 137 mmol/L (ref 135–145)

## 2023-12-29 LAB — CBC
HCT: 36.3 % (ref 36.0–46.0)
Hemoglobin: 10.7 g/dL — ABNORMAL LOW (ref 12.0–15.0)
MCH: 28.9 pg (ref 26.0–34.0)
MCHC: 29.5 g/dL — ABNORMAL LOW (ref 30.0–36.0)
MCV: 98.1 fL (ref 80.0–100.0)
Platelets: 210 10*3/uL (ref 150–400)
RBC: 3.7 MIL/uL — ABNORMAL LOW (ref 3.87–5.11)
RDW: 14 % (ref 11.5–15.5)
WBC: 8 10*3/uL (ref 4.0–10.5)
nRBC: 0 % (ref 0.0–0.2)

## 2023-12-29 NOTE — ED Notes (Signed)
 Given patient coffee

## 2023-12-29 NOTE — ED Notes (Signed)
 Patient ambulated to the bathroom.

## 2023-12-29 NOTE — ED Notes (Signed)
Patient given soda. 

## 2023-12-29 NOTE — Plan of Care (Signed)

## 2023-12-29 NOTE — Progress Notes (Signed)
 PROGRESS NOTE    ROSSETTA KAMA  ZOX:[id number] DOB: Jun 08, 1956 DOA: 12/28/2023 PCP: Chana Comas, FNP   Brief Narrative:  This 68 yrs old female with medical history significant for lung cancer stage III status post lobectomy in 2018, COPD, Chronic Hypoxic respiratory failure on  2L nasal cannula @ baseline, bipolar disorder, paroxysmal atrial fibrillation, hypothyroidism who presented to ED with complaints of worsening shortness of breath and cough x 2-3 days. She states her cough has been dry and her shortness of breath has gotten progressively worse despite breathing treatments. She feels very tight and wheezy. She increased her oxygen  with no improvement at home. She continues to smoke. Last night woke up in a sweat, but no fever. She denies any fever/chills or sick contacts.    Admitted 4/4-4/11 for acute COPD exacerbation with acute on chronic respiratory failure.  ED workup, hypoxic requiring nonrebreather SpO2 100%. CXR: emphysema, chronic volume loss/scarring in the left upper lung.  Patient admitted for further evaluation,  started on IV Solu-Medrol ,  scheduled and as needed bronchodilators.  Assessment & Plan:   Principal Problem:   COPD with acute exacerbation and acute on chronic respiratory failure with hypoxia Active Problems:   Acute on chronic respiratory failure with hypoxia (HCC)   Essential hypertension   Paroxysmal atrial fibrillation (HCC)   Adenocarcinoma of lung (HCC)   Coronary artery disease involving native coronary artery of native heart without angina pectoris   Hypothyroidism   Hyperlipidemia   Anxiety and depression and bipolar   Bipolar disorder (HCC)  Acute on chronic hypoxic respiratory failure: COPD with acute exacerbation: She presented with 2-3 day history of worsening shortness of breath, tightness and wheezing,  found to be hypoxic needing 5L oxygen  from her baseline of 2L due to COPD exacerbation.  Covid / Flu / RSV negative.  Troponin wnl  x2, BNP wnl  She has been on oral prednisone , continue solumedrol for another day  Continue scheduled duonebs, PRN SABA Continue home Breztri   weaned back down to 2L oxygen . She was recently hospitalized for COPD exacerbation a week ago. She is still smoking , encouraged cessation.  There is concern for possible recurrence of lung cancer in her RUL>unsure if contributing.  PET scan and oncology appointment pending Follows with pulmonology. Continue incentive spirometry. Continue tessalon  pearls, she likes these    Essential hypertension: Pressures soft. Hold her losartan . Continue metoprolol     Paroxysmal atrial fibrillation (HCC) HR controlled.  Continue eliquis  and metoprolol .   Adenocarcinoma of lung (HCC): Stage IIIA LUL lung adenocarcinoma , s/p left upper lobectomy 2018 and adjunct chemo complete in 2019  -CT: 11/2023: Increasing subpleural consolidation within the right upper lobe  abutting the major fissure, measuring up to 2.1 x 0.9 cm  There is concern for recurrence. PET scan ordered She was No showed her oncology appointment on 12/10/23 >rescheduled on 4/28    Coronary artery disease involving native coronary artery of native heart without angina pectoris Continue medical management. Troponin wnl .   Hypothyroidism: Continue synthroid  125mcg   Hyperlipidemia: Continue Crestor .    Anxiety and depression and bipolar: Continue Zoloft .    DVT prophylaxis:Eliquis  Code Status: DNR Family Communication:No family at bed side Disposition Plan:   Status is: Inpatient Remains inpatient appropriate because: Admitted for acute on chronic hypoxic respiratory failure secondary to COPD exacerbation.    Consultants:  None  Procedures: None Antimicrobials: Anti-infectives (From admission, onward)    None      Subjective: Patient was seen and  examined at bedside.Overnight events noted. Patient reports she was having significant wheezing and difficulty breathing  despite taking nebulized bronchodilators at home.  She continues to smoke.  Objective: Vitals:   12/29/23 0730 12/29/23 1017 12/29/23 1100 12/29/23 1200  BP: 104/62 111/63 100/63 111/71  Pulse:  79 99 80  Resp:  18 17 18   Temp:  98.2 F (36.8 C)    TempSrc:  Oral    SpO2:  98% 99% 100%  Weight:      Height:        Intake/Output Summary (Last 24 hours) at 12/29/2023 1256 Last data filed at 12/28/2023 1922 Gross per 24 hour  Intake 1000 ml  Output --  Net 1000 ml   Filed Weights   12/28/23 1621  Weight: 45.4 kg    Examination:  General exam: Appears calm and comfortable, deconditioned, not in any acute distress. Respiratory system: Wheezing bilaterally. Respiratory effort normal. RR 16 Cardiovascular system: S1 & S2 heard, RRR. No JVD, murmurs, rubs, gallops or clicks. No pedal edema. Gastrointestinal system: Abdomen is non distended, soft and non tender. Normal bowel sounds heard. Central nervous system: Alert and oriented X 3. No focal neurological deficits. Extremities: No edema, no cyanosis, no clubbing Skin: No rashes, lesions or ulcers Psychiatry: Judgement and insight appear normal. Mood & affect appropriate.     Data Reviewed: I have personally reviewed following labs and imaging studies  CBC: Recent Labs  Lab 12/28/23 1728 12/29/23 0536  WBC 14.9* 8.0  NEUTROABS 13.5*  --   HGB 12.5 10.7*  HCT 41.8 36.3  MCV 97.9 98.1  PLT 237 210   Basic Metabolic Panel: Recent Labs  Lab 12/28/23 1728 12/29/23 0536  NA 143 137  K 3.9 4.4  CL 105 99  CO2 30 33*  GLUCOSE 134* 196*  BUN 14 21  CREATININE 0.58 0.51  CALCIUM  8.7* 8.6*  MG 1.7  --    GFR: Estimated Creatinine Clearance: 50.2 mL/min (by C-G formula based on SCr of 0.51 mg/dL). Liver Function Tests: Recent Labs  Lab 12/28/23 1728  AST 17  ALT 15  ALKPHOS 55  BILITOT 0.3  PROT 6.4*  ALBUMIN 3.4*   No results for input(s): "LIPASE", "AMYLASE" in the last 168 hours. No results for  input(s): "AMMONIA" in the last 168 hours. Coagulation Profile: No results for input(s): "INR", "PROTIME" in the last 168 hours. Cardiac Enzymes: No results for input(s): "CKTOTAL", "CKMB", "CKMBINDEX", "TROPONINI" in the last 168 hours. BNP (last 3 results) No results for input(s): "PROBNP" in the last 8760 hours. HbA1C: No results for input(s): "HGBA1C" in the last 72 hours. CBG: No results for input(s): "GLUCAP" in the last 168 hours. Lipid Profile: No results for input(s): "CHOL", "HDL", "LDLCALC", "TRIG", "CHOLHDL", "LDLDIRECT" in the last 72 hours. Thyroid  Function Tests: Recent Labs    12/28/23 2055  TSH 2.694   Anemia Panel: No results for input(s): "VITAMINB12", "FOLATE", "FERRITIN", "TIBC", "IRON ", "RETICCTPCT" in the last 72 hours. Sepsis Labs: No results for input(s): "PROCALCITON", "LATICACIDVEN" in the last 168 hours.  Recent Results (from the past 240 hours)  Resp panel by RT-PCR (RSV, Flu A&B, Covid) Anterior Nasal Swab     Status: None   Collection Time: 12/28/23  5:28 PM   Specimen: Anterior Nasal Swab  Result Value Ref Range Status   SARS Coronavirus 2 by RT PCR NEGATIVE NEGATIVE Final    Comment: (NOTE) SARS-CoV-2 target nucleic acids are NOT DETECTED.  The SARS-CoV-2 RNA is generally detectable in  upper respiratory specimens during the acute phase of infection. The lowest concentration of SARS-CoV-2 viral copies this assay can detect is 138 copies/mL. A negative result does not preclude SARS-Cov-2 infection and should not be used as the sole basis for treatment or other patient management decisions. A negative result may occur with  improper specimen collection/handling, submission of specimen other than nasopharyngeal swab, presence of viral mutation(s) within the areas targeted by this assay, and inadequate number of viral copies(<138 copies/mL). A negative result must be combined with clinical observations, patient history, and  epidemiological information. The expected result is Negative.  Fact Sheet for Patients:  BloggerCourse.com  Fact Sheet for Healthcare Providers:  SeriousBroker.it  This test is no t yet approved or cleared by the [place]  FDA and  has been authorized for detection and/or diagnosis of SARS-CoV-2 by FDA under an Emergency Use Authorization (EUA). This EUA will remain  in effect (meaning this test can be used) for the duration of the COVID-19 declaration under Section 564(b)(1) of the Act, 21 U.S.C.section 360bbb-3(b)(1), unless the authorization is terminated  or revoked sooner.       Influenza A by PCR NEGATIVE NEGATIVE Final   Influenza B by PCR NEGATIVE NEGATIVE Final    Comment: (NOTE) The Xpert Xpress SARS-CoV-2/FLU/RSV plus assay is intended as an aid in the diagnosis of influenza from Nasopharyngeal swab specimens and should not be used as a sole basis for treatment. Nasal washings and aspirates are unacceptable for Xpert Xpress SARS-CoV-2/FLU/RSV testing.  Fact Sheet for Patients: BloggerCourse.com  Fact Sheet for Healthcare Providers: SeriousBroker.it  This test is not yet approved or cleared by the [place]  FDA and has been authorized for detection and/or diagnosis of SARS-CoV-2 by FDA under an Emergency Use Authorization (EUA). This EUA will remain in effect (meaning this test can be used) for the duration of the COVID-19 declaration under Section 564(b)(1) of the Act, 21 U.S.C. section 360bbb-3(b)(1), unless the authorization is terminated or revoked.     Resp Syncytial Virus by PCR NEGATIVE NEGATIVE Final    Comment: (NOTE) Fact Sheet for Patients: BloggerCourse.com  Fact Sheet for Healthcare Providers: SeriousBroker.it  This test is not yet approved or cleared by the [place]  FDA and has been  authorized for detection and/or diagnosis of SARS-CoV-2 by FDA under an Emergency Use Authorization (EUA). This EUA will remain in effect (meaning this test can be used) for the duration of the COVID-19 declaration under Section 564(b)(1) of the Act, 21 U.S.C. section 360bbb-3(b)(1), unless the authorization is terminated or revoked.  Performed at [facility], [address] [place], [place] [address]    Radiology Studies: DG Chest 2 View Result Date: 12/28/2023 CLINICAL DATA:  Shortness of breath for 1 day. History of lung cancer. EXAM: CHEST - 2 VIEW COMPARISON:  Radiograph 12/12/2023 and CTA chest 12/13/2023 FINDINGS: Chronic interstitial coarsening and hyperinflation of the right lung. Chronic scarring/volume loss of the left upper lung with leftward deviation of the trachea. No focal consolidation. Chronic blunting of the left costophrenic angle. No pneumothorax. Stable cardiomediastinal silhouette. No displaced rib fractures. IMPRESSION: Emphysema.  Chronic volume loss/scarring in the left upper lung. Electronically Signed   By: Rozell Cornet M.D.   On: 12/28/2023 17:28   Scheduled Meds:  apixaban   5 mg Oral BID   budeson-glycopyrrolate -formoterol   1 puff Inhalation BID   gatifloxacin   1 drop Left Eye QID   levothyroxine   125 mcg Oral Q0600   magnesium  oxide  400 mg  Oral Daily   methylPREDNISolone  (SOLU-MEDROL ) injection  125 mg Intravenous Q12H   Followed by   Cecily Cohen ON 12/30/2023] predniSONE   40 mg Oral Q breakfast   metoprolol  succinate  12.5 mg Oral Daily   polyvinyl alcohol   1 drop Both Eyes QID   prednisoLONE  acetate  1 drop Left Eye QID   rosuvastatin   20 mg Oral Daily   sertraline   25 mg Oral Daily   Continuous Infusions:   LOS: 1 day    Time spent: 50 mins    Magdalene School, MD [facility]   If 7PM-7AM, please contact night-coverage

## 2023-12-30 ENCOUNTER — Encounter (HOSPITAL_COMMUNITY): Payer: Self-pay | Admitting: Family Medicine

## 2023-12-30 DIAGNOSIS — J441 Chronic obstructive pulmonary disease with (acute) exacerbation: Secondary | ICD-10-CM | POA: Diagnosis not present

## 2023-12-30 LAB — BASIC METABOLIC PANEL WITH GFR
Anion gap: 8 (ref 5–15)
BUN: 18 mg/dL (ref 8–23)
CO2: 30 mmol/L (ref 22–32)
Calcium: 8.4 mg/dL — ABNORMAL LOW (ref 8.9–10.3)
Chloride: 95 mmol/L — ABNORMAL LOW (ref 98–111)
Creatinine, Ser: 0.61 mg/dL (ref 0.44–1.00)
GFR, Estimated: >60 mL/min (ref >60)
Glucose, Bld: 175 mg/dL — ABNORMAL HIGH (ref 70–99)
Potassium: 4 mmol/L (ref 3.5–5.1)
Sodium: 133 mmol/L — ABNORMAL LOW (ref 135–145)

## 2023-12-30 LAB — CBC
HCT: 35.4 % — ABNORMAL LOW (ref 36.0–46.0)
Hemoglobin: 11.1 g/dL — ABNORMAL LOW (ref 12.0–15.0)
MCH: 30 pg (ref 26.0–34.0)
MCHC: 31.4 g/dL (ref 30.0–36.0)
MCV: 95.7 fL (ref 80.0–100.0)
Platelets: 211 10*3/uL (ref 150–400)
RBC: 3.7 MIL/uL — ABNORMAL LOW (ref 3.87–5.11)
RDW: 13.7 % (ref 11.5–15.5)
WBC: 10.6 10*3/uL — ABNORMAL HIGH (ref 4.0–10.5)
nRBC: 0 % (ref 0.0–0.2)

## 2023-12-30 LAB — MAGNESIUM: Magnesium: 2 mg/dL (ref 1.7–2.4)

## 2023-12-30 LAB — PHOSPHORUS: Phosphorus: 2.1 mg/dL — ABNORMAL LOW (ref 2.5–4.6)

## 2023-12-30 MED ORDER — PREDNISONE 20 MG PO TABS: 40.0000 mg | ORAL_TABLET | Freq: Every day | ORAL | 0 refills | Status: DC

## 2023-12-30 NOTE — TOC Transition Note (Addendum)
 Transition of Care [facility]) - Discharge Note   Patient Details  Name: Tracy Park MRN: [id number] Date of Birth: 1956/03/05  Transition of Care [facility]) CM/SW Contact:  Loreda Rodriguez, RN Phone [phone]  12/30/2023, 12:28 PM   Clinical Narrative:    Patient has no transportation and O2 tank unable to ambulate from bus stop. Transportation has been arranged per safe transport. Driver will call unit for pickup at main entrance.   Safe transport has been cancelled per patients request.    Final next level of care: Home/Self Care Barriers to Discharge: ED Transportation   Patient Goals and CMS Choice Patient states their goals for this hospitalization and ongoing recovery are:: Wants to go home          Discharge Placement                       Discharge Plan and Services Additional resources added to the After Visit Summary for   In-house Referral: NA Discharge Planning Services: CM Consult            DME Arranged: N/A DME Agency: NA       HH Arranged: NA HH Agency: NA        Social Drivers of Health (SDOH) Interventions SDOH Screenings   Food Insecurity: No Food Insecurity (12/30/2023)  Recent Concern: Food Insecurity - Food Insecurity Present (10/08/2023)   Received from [facility]  Housing: Low Risk  (12/30/2023)  Transportation Needs: No Transportation Needs (12/30/2023)  Recent Concern: Transportation Needs - Unmet Transportation Needs (10/08/2023)   Received from [facility]  Utilities: Not At Risk (12/30/2023)  Financial Resource Strain: High Risk (10/08/2023)   Received from [facility]  Physical Activity: Unknown (04/07/2023)   [facility] from [facility]  Social Connections: Socially Isolated (12/30/2023)  Stress: Stress Concern Present (04/07/2023)   Received from [facility]  Tobacco Use: High Risk (12/30/2023)     Readmission Risk Interventions    12/30/2023   11:48 AM 11/07/2023    8:20 AM 09/28/2023    4:41 PM   Readmission Risk Prevention Plan  Transportation Screening Complete Complete Complete  Medication Review Oceanographer) Complete Complete Complete  PCP or Specialist appointment within 3-5 days of discharge Complete Complete Complete  HRI or Home Care Consult Complete Complete Complete  SW Recovery Care/Counseling Consult Complete Complete Complete  Palliative Care Screening Not Applicable Not Applicable Not Applicable  Skilled Nursing Facility Not Applicable Not Applicable Not Applicable

## 2023-12-30 NOTE — Plan of Care (Signed)
  Problem: Clinical Measurements: Goal: Ability to maintain clinical measurements within normal limits will improve Outcome: Progressing Goal: Diagnostic test results will improve Outcome: Progressing Goal: Respiratory complications will improve Outcome: Progressing Goal: Cardiovascular complication will be avoided Outcome: Progressing   Problem: Respiratory: Goal: Ability to maintain a clear airway will improve Outcome: Progressing Goal: Levels of oxygenation will improve Outcome: Progressing Goal: Ability to maintain adequate ventilation will improve Outcome: Progressing

## 2023-12-30 NOTE — Discharge Instructions (Signed)
 Advised to take prednisone  40 mg daily for 4 more days. Advised to complete the course of doxycycline  for 10 days. Explained in detail about the importance of quit smoking and medication compliance.

## 2023-12-30 NOTE — TOC Initial Note (Signed)
 Transition of Care [facility]) - Initial/Assessment Note    Patient Details  Name: Tracy Park MRN: [id number] Date of Birth: 04-06-1956  Transition of Care [facility]) CM/SW Contact:    Loreda Rodriguez, RN Phone Number:[phone]  12/30/2023, 12:02 PM  Clinical Narrative:                 TOC following patient admitted with high risk for readmission. Patient is from home with her daughter where she normally functions independently. Patient does have home O2 and reports that she lives with her daughter who is also he POA. Patient states that she does have PCP Alyn Babe, [employer], FNP) and she does follow up on a regular basis. Patient states that she does have access to affordable medications.   Expected Discharge Plan: Home/Self Care Barriers to Discharge: ED Transportation   Patient Goals and CMS Choice Patient states their goals for this hospitalization and ongoing recovery are:: Wants to go home          Expected Discharge Plan and Services In-house Referral: NA Discharge Planning Services: CM Consult   Living arrangements for the past 2 months: Single Family Home Expected Discharge Date: 12/30/23               DME Arranged: N/A DME Agency: NA       HH Arranged: NA HH Agency: NA        Prior Living Arrangements/Services Living arrangements for the past 2 months: Single Family Home Lives with:: Adult Children Patient language and need for interpreter reviewed:: Yes Do you feel safe going back to the place where you live?: Yes      Need for Family Participation in Patient Care: No (Comment) Care giver support system in place?: No (comment) Current home services: DME (oxygen ) Criminal Activity/Legal Involvement Pertinent to Current Situation/Hospitalization: No - Comment as needed  Activities of Daily Living   ADL Screening (condition at time of admission) Independently performs ADLs?: Yes (appropriate for developmental age) Is the patient deaf or have difficulty hearing?:  No Does the patient have difficulty seeing, even when wearing glasses/contacts?: No Does the patient have difficulty concentrating, remembering, or making decisions?: No  Permission Sought/Granted Permission sought to share information with : Family Supports Permission granted to share information with : No              Emotional Assessment Appearance:: Appears stated age Attitude/Demeanor/Rapport: Gracious Affect (typically observed): Accepting Orientation: : Oriented to Self, Oriented to Place, Oriented to  Time, Oriented to Situation Alcohol  / Substance Use: Not Applicable Psych Involvement: No (comment)  Admission diagnosis:  COPD exacerbation (HCC) [J44.1] COPD with acute exacerbation (HCC) [J44.1] Patient Active Problem List   Diagnosis Date Noted   Chronic respiratory failure with hypoxia (HCC) 12/13/2023   COPD with acute exacerbation and acute on chronic respiratory failure with hypoxia 09/22/2023   History of depression 08/03/2023   Left-sided chest pain 08/03/2023   Rib pain on left side 06/24/2023   Food insecurity 06/05/2023   Left foot pain 04/12/2023   Acute exacerbation of chronic obstructive pulmonary disease (COPD) (HCC) 03/06/2023   Hypocalcemia 03/06/2023   Generalized anxiety disorder 03/06/2023   Paroxysmal atrial fibrillation (HCC) 02/23/2023   Paroxysmal atrial fibrillation with RVR (HCC) 01/25/2023   Pulmonary nodule 01/25/2023   COPD (chronic obstructive pulmonary disease) (HCC) 01/25/2023   Essential hypertension 01/25/2023   Thrombocytosis 03/26/2022   COPD exacerbation (HCC) 03/25/2022   DDD (degenerative disc disease), cervical 03/24/2022   Bipolar disorder (HCC)  03/24/2022   Septic shock (HCC) 03/14/2022   Sepsis due to pneumonia (HCC) 03/13/2022   Dyslipidemia 03/13/2022   Anxiety and depression and bipolar 03/13/2022   History of lung cancer 03/13/2022   Tobacco abuse 03/13/2022   Pneumonia 02/27/2022   Adenocarcinoma of lung (HCC)  02/26/2022   Leukocytosis 02/26/2022   Acute on chronic respiratory failure with hypoxia (HCC) 02/26/2022   Normocytic anemia 02/08/2022   Protein-calorie malnutrition, severe 08/25/2021   Influenza A with pneumonia 08/16/2021   Sepsis (HCC) 08/16/2021   Hyponatremia 08/16/2021   Hypokalemia 08/16/2021   Hypomagnesemia 08/16/2021   Hydronephrosis 05/03/2021   Acute encephalopathy 05/02/2021   CAP (community acquired pneumonia) 04/10/2020   Coronary artery disease involving native coronary artery of native heart without angina pectoris 08/18/2018   Malignant neoplasm of upper lobe of left lung (HCC) 11/12/2016   Chronic bilateral low back pain without sciatica 08/21/2016   Obstructive sleep apnea (adult) (pediatric) 02/22/2015   Hyperlipidemia 06/03/2013   Vitamin D  deficiency 06/03/2013   Hypothyroidism 04/28/2012   Nicotine  abuse 04/28/2012   Chest pain, atypical 04/28/2012   PCP:  Chana Comas, FNP Pharmacy:   [id number], [place] - [address] [place] [address] Phone: [phone] Fax: [phone]  [place] [facility] [address] [address] Phone: [phone] Fax: [phone]     Social Drivers of Health (SDOH) Social History: SDOH Screenings   Food Insecurity: No Food Insecurity (12/30/2023)  Recent Concern: Food Insecurity - Food Insecurity Present (10/08/2023)   [facility] from [facility]  Housing: Low Risk  (12/30/2023)  Transportation Needs: No Transportation Needs (12/30/2023)  Recent Concern: Transportation Needs - Unmet Transportation Needs (10/08/2023)   Received from [facility]  Utilities: Not At Risk (12/30/2023)  Financial Resource Strain: High Risk (10/08/2023)   Received from [facility]  Physical Activity: Unknown (04/07/2023)   Received from [facility]  Social Connections: Socially Isolated (12/30/2023)  Stress: Stress Concern  Present (04/07/2023)   Received from [facility]  Tobacco Use: High Risk (12/30/2023)   SDOH Interventions:     Readmission Risk Interventions    12/30/2023   11:48 AM 11/07/2023    8:20 AM 09/28/2023    4:41 PM  Readmission Risk Prevention Plan  Transportation Screening Complete Complete Complete  Medication Review Oceanographer) Complete Complete Complete  PCP or Specialist appointment within 3-5 days of discharge Complete Complete Complete  HRI or Home Care Consult Complete Complete Complete  SW Recovery Care/Counseling Consult Complete Complete Complete  Palliative Care Screening Not Applicable Not Applicable Not Applicable  Skilled Nursing Facility Not Applicable Not Applicable Not Applicable

## 2023-12-30 NOTE — Discharge Summary (Signed)
 Physician Discharge Summary  Tracy Park WUJ:[id number] DOB: 1956/01/07 DOA: 12/28/2023  PCP: Chana Comas, FNP  Admit date: 12/28/2023  Discharge date: 12/30/2023  Admitted From: Home  Disposition:  Home  Recommendations for Outpatient Follow-up:  Follow up with PCP in 1-2 weeks. Please obtain BMP/CBC in one week. Advised to take prednisone  40 mg daily for 4 more days. Advised to complete the course of doxycycline  for 10 days. Explained in detail about the importance of quit smoking and medication compliance.  Home Health: None Equipment/Devices:Home Oxygen  @ 2.5 L/min  Discharge Condition: Stable CODE STATUS: DNR Diet recommendation: Heart Healthy   Brief Summary/ Hospital Course: This 68 yrs old female with medical history significant for lung cancer stage III status post lobectomy in 2018, COPD, Chronic Hypoxic respiratory failure on  2L nasal cannula @ baseline, bipolar disorder, paroxysmal atrial fibrillation, hypothyroidism who presented to ED with complaints of worsening shortness of breath and cough x 2-3 days. She states her cough has been dry and her shortness of breath has gotten progressively worse despite breathing treatments. She feels very tight and wheezy. She increased her oxygen  with no improvement at home. She continues to smoke. Last night woke up in a sweat, but no fever. She denies any fever/chills or sick contacts.  She was Admitted in [facility] from 4/4-4/11 for acute COPD exacerbation with acute on chronic respiratory failure.  ED workup, hypoxic requiring nonrebreather SpO2 100%. CXR: emphysema, chronic volume loss/scarring in the left upper lung.  Patient admitted for further evaluation,  started on IV Solu-Medrol ,  scheduled and as needed bronchodilators. Patient was continued on IV Solu-Medrol  and scheduled and as needed nebulized bronchodilators.  Patient has made significant improvement.  She is back to her baseline,  States she feels fine and wants to be  discharged.  Patient was explained in detail about quit smoking and medication compliance.  She agreed and promised to follow the recommendations. She is being discharged home.  Discharge Diagnoses:  Principal Problem:   COPD with acute exacerbation and acute on chronic respiratory failure with hypoxia Active Problems:   Acute on chronic respiratory failure with hypoxia (HCC)   Essential hypertension   Paroxysmal atrial fibrillation (HCC)   Adenocarcinoma of lung (HCC)   Coronary artery disease involving native coronary artery of native heart without angina pectoris   Hypothyroidism   Hyperlipidemia   Anxiety and depression and bipolar   Bipolar disorder (HCC)  Acute on chronic hypoxic respiratory failure: COPD with acute exacerbation: She presented with 2-3 day history of worsening shortness of breath, tightness and wheezing,   She is found to be hypoxic needing 5L oxygen  from her baseline of 2L due to COPD exacerbation.  Covid / Flu / RSV negative.  Troponin wnl x2, BNP wnl  She has been on oral prednisone , continue solumedrol for another day  Continue scheduled duonebs, PRN SABA Continue home Breztri   weaned back down to 2L oxygen . She was recently hospitalized for COPD exacerbation a week ago. She is still smoking , encouraged cessation.  There is concern for possible recurrence of lung cancer in her RUL>unsure if contributing.  PET scan and oncology appointment pending Follows with pulmonology. Continue incentive spirometry. Continue tessalon  pearls, she likes these. She feels better and wants to be discharged.   Essential hypertension: Pressures soft. Hold her losartan . Continue metoprolol     Paroxysmal atrial fibrillation (HCC) HR controlled.  Continue eliquis  and metoprolol .   Adenocarcinoma of lung (HCC): Stage IIIA LUL lung adenocarcinoma , s/p left  upper lobectomy 2018 and adjunct chemo complete in 2019  -CT: 11/2023: Increasing subpleural consolidation within  the right upper lobe  abutting the major fissure, measuring up to 2.1 x 0.9 cm  There is concern for recurrence. PET scan ordered She was No showed her oncology appointment on 12/10/23 >rescheduled on 4/28    Coronary artery disease involving native coronary artery of native heart without angina pectoris Continue medical management. Troponin wnl .   Hypothyroidism: Continue synthroid  125mcg   Hyperlipidemia: Continue Crestor .    Anxiety and depression and bipolar: Continue Zoloft .     Discharge Instructions  Discharge Instructions     Call MD for:  difficulty breathing, headache or visual disturbances   Complete by: As directed    Call MD for:  persistant dizziness or light-headedness   Complete by: As directed    Call MD for:  persistant nausea and vomiting   Complete by: As directed    Diet - low sodium heart healthy   Complete by: As directed    Diet Carb Modified   Complete by: As directed    Discharge instructions   Complete by: As directed    Advised to follow-up with primary care physician in 1 week. Advised to take prednisone  40 mg daily for 4 more days. Advised to complete the course of doxycycline  for 10 days. Explained in detail about the importance of quit smoking and medication compliance.   Increase activity slowly   Complete by: As directed       Allergies as of 12/30/2023       Reactions   Red Dye #40 (allura Red) Hives, Itching, Other (See Comments)   Red food dye   Strawberry Extract Hives, Itching   Tomato Hives, Itching   Aspirin Hives   Tape Rash, Other (See Comments)   Prefers paper tape   Wound Dressing Adhesive Rash        Medication List     TAKE these medications    albuterol  (2.5 MG/3ML) 0.083% nebulizer solution Commonly known as: PROVENTIL  Take 3 mLs (2.5 mg total) by nebulization every 4 (four) hours while awake for 3 days, THEN 3 mLs (2.5 mg total) every 4 (four) hours as needed for wheezing or shortness of breath. Start  taking on: October 18, 2023 What changed: See the new instructions.   albuterol  108 (90 Base) MCG/ACT inhaler Commonly known as: VENTOLIN  HFA Inhale 2 puffs into the lungs every 6 (six) hours as needed for wheezing or shortness of breath. What changed: Another medication with the same name was changed. Make sure you understand how and when to take each.   benzonatate  100 MG capsule Commonly known as: TESSALON  Take 1 capsule (100 mg total) by mouth 3 (three) times daily as needed for cough.   Breztri  Aerosphere 160-9-4.8 MCG/ACT Aero inhaler Generic drug: budeson-glycopyrrolate -formoterol  Inhale 1 puff into the lungs in the morning and at bedtime.   doxycycline  100 MG tablet Commonly known as: VIBRA -TABS Take 100 mg by mouth 2 (two) times daily. For 10 days   Eliquis  5 MG Tabs tablet Generic drug: apixaban  Take 1 tablet (5 mg total) by mouth 2 (two) times daily.   levothyroxine  125 MCG tablet Commonly known as: SYNTHROID  Take 125 mcg by mouth daily before breakfast.   losartan  25 MG tablet Commonly known as: COZAAR  Take 12.5 mg by mouth daily.   magnesium  oxide 400 (240 Mg) MG tablet Commonly known as: MAG-OX Take 400 mg by mouth in the morning.  metoprolol  succinate 25 MG 24 hr tablet Commonly known as: TOPROL -XL Take 0.5 tablets (12.5 mg total) by mouth daily.   moxifloxacin 0.5 % ophthalmic solution Commonly known as: VIGAMOX Place 1 drop into the left eye 4 (four) times daily.   nitroGLYCERIN  0.4 MG SL tablet Commonly known as: NITROSTAT  Place 0.4 mg under the tongue every 5 (five) minutes as needed for chest pain.   oxyCODONE  5 MG immediate release tablet Commonly known as: Roxicodone  Take 1 tablet (5 mg total) by mouth every 6 (six) hours as needed for severe pain (pain score 7-10).   OXYGEN  Inhale 2 L/min into the lungs continuous.   prednisoLONE  acetate 1 % ophthalmic suspension Commonly known as: PRED FORTE  Place 1 drop into the left eye 4 (four)  times daily.   predniSONE  20 MG tablet Commonly known as: DELTASONE  Take 2 tablets (40 mg total) by mouth daily with breakfast for 4 days. What changed: additional instructions   rosuvastatin  20 MG tablet Commonly known as: CRESTOR  Take 20 mg by mouth daily.   sertraline  25 MG tablet Commonly known as: ZOLOFT  Take 25 mg by mouth in the morning.   Systane Complete PF 0.6 % Soln Generic drug: Propylene Glycol (PF) Place 1 drop into both eyes 4 (four) times daily.   Tylenol  8 Hour 650 MG CR tablet Generic drug: acetaminophen  Take 650-1,300 mg by mouth 2 (two) times daily as needed for pain.        Follow-up Information     Chana Comas, FNP Follow up in 3 day(s).   Specialty: Family Medicine Contact information: [address] [place] [address] [phone]                Allergies  Allergen Reactions   Red Dye #40 (Allura Red) Hives, Itching and Other (See Comments)    Red food dye   Strawberry Extract Hives and Itching   Tomato Hives and Itching   Aspirin Hives   Tape Rash and Other (See Comments)    Prefers paper tape   Wound Dressing Adhesive Rash    Consultations: None   Procedures/Studies: DG Chest 2 View Result Date: 12/28/2023 CLINICAL DATA:  Shortness of breath for 1 day. History of lung cancer. EXAM: CHEST - 2 VIEW COMPARISON:  Radiograph 12/12/2023 and CTA chest 12/13/2023 FINDINGS: Chronic interstitial coarsening and hyperinflation of the right lung. Chronic scarring/volume loss of the left upper lung with leftward deviation of the trachea. No focal consolidation. Chronic blunting of the left costophrenic angle. No pneumothorax. Stable cardiomediastinal silhouette. No displaced rib fractures. IMPRESSION: Emphysema.  Chronic volume loss/scarring in the left upper lung. Electronically Signed   By: Rozell Cornet M.D.   On: 12/28/2023 17:28   CT Angio Chest PE W/Cm &/Or Wo Cm Result Date: 12/13/2023 CLINICAL DATA:  Suspected  pulmonary embolism. EXAM: CT ANGIOGRAPHY CHEST WITH CONTRAST TECHNIQUE: Multidetector CT imaging of the chest was performed using the standard protocol during bolus administration of intravenous contrast. Multiplanar CT image reconstructions and MIPs were obtained to evaluate the vascular anatomy. RADIATION DOSE REDUCTION: This exam was performed according to the departmental dose-optimization program which includes automated exposure control, adjustment of the mA and/or kV according to patient size and/or use of iterative reconstruction technique. CONTRAST:  80mL OMNIPAQUE  IOHEXOL  350 MG/ML SOLN COMPARISON:  November 25, 2023 FINDINGS: Cardiovascular: There is marked severity calcification of the aortic arch, without evidence of aortic aneurysm. Satisfactory opacification of the pulmonary arteries to the segmental level. No evidence of  pulmonary embolism. Normal heart size with mild coronary artery calcification. No pericardial effusion. Mediastinum/Nodes: No enlarged mediastinal, hilar, or axillary lymph nodes. Thyroid  gland, trachea, and esophagus demonstrate no significant findings. Lungs/Pleura: There is evidence of prior left upper lobectomy with subsequent left-sided volume loss. Emphysematous lung disease is also seen with bullous changes and chronic scarring seen within the superior segment of the left lower lobe. Mild, stable subpleural consolidation is seen along the posterior aspect of the right upper lobe. Mild posterior left lower lobe and posterior right basilar linear atelectasis is seen. No pleural effusion or pneumothorax is identified. Upper Abdomen: No acute abnormality. Musculoskeletal: Postoperative changes are seen within the visualized portion of the lower cervical spine. No acute osseous abnormalities are identified. Review of the MIP images confirms the above findings. IMPRESSION: 1. No evidence of pulmonary embolism. 2. Evidence of prior left upper lobectomy with subsequent left-sided volume  loss. 3. Emphysematous lung disease with bullous changes and chronic scarring within the superior segment of the left lower lobe. 4. Mild, stable subpleural consolidation along the posterior aspect of the right upper lobe. 5. Mild posterior left lower lobe and posterior right basilar linear atelectasis. 6. Aortic atherosclerosis. Aortic Atherosclerosis (ICD10-I70.0) and Emphysema (ICD10-J43.9). Electronically Signed   By: Virgle Grime M.D.   On: 12/13/2023 03:19   DG Chest 2 View Result Date: 12/12/2023 CLINICAL DATA:  Chest pain EXAM: CHEST - 2 VIEW COMPARISON:  12/09/2023, 08/06/2023 FINDINGS: Right lung is grossly clear. Chronic volume loss and postoperative change in the left thorax prick no acute airspace disease, pleural effusion or pneumothorax. Emphysema. IMPRESSION: No active cardiopulmonary disease. Emphysema. Electronically Signed   By: Esmeralda Hedge M.D.   On: 12/12/2023 23:33   DG Chest 2 View Result Date: 12/09/2023 CLINICAL DATA:  Shortness of breath. EXAM: CHEST - 2 VIEW COMPARISON:  12/03/2023. FINDINGS: Redemonstration of opacity overlying the left upper lung zone which is essentially similar to prior studies and compatible with provided history of lung malignancy, status post treatment. Bilateral lungs otherwise appear hyperexpanded and hyperlucent with coarse bronchovascular markings, in keeping with COPD. No new dense consolidation or lung collapse. Bilateral costophrenic angles are clear. Normal cardio-mediastinal silhouette. No acute osseous abnormalities. The soft tissues are within normal limits. IMPRESSION: No active cardiopulmonary disease. COPD. Electronically Signed   By: Beula Brunswick M.D.   On: 12/09/2023 15:01   DG Chest 2 View Result Date: 12/03/2023 CLINICAL DATA:  Shortness of breath EXAM: CHEST - 2 VIEW COMPARISON:  11/25/2023 chest x-ray and CT, 06/24/2023 FINDINGS: Chronic postsurgical changes and pleuroparenchymal scarring at left apex. Vague right upper lobe  pulmonary opacity. Stable cardiomediastinal silhouette. No pneumothorax IMPRESSION: 1. Vague right upper lobe pulmonary opacity, possible pneumonia. Imaging follow-up to resolution is recommended 2. Chronic postsurgical changes and pleuroparenchymal scarring at left apex. Electronically Signed   By: Esmeralda Hedge M.D.   On: 12/03/2023 16:54    Subjective: Patient was seen and examined at bedside.  Overnight events noted.   She reports feeling much improved and wants to be discharged.   Medication compliance and quit smoking explained in detail.  Discharge Exam: Vitals:   12/30/23 1100 12/30/23 1321  BP:  122/74  Pulse:  69  Resp: 18 (!) 22  Temp:  98.2 F (36.8 C)  SpO2:  98%   Vitals:   12/30/23 0900 12/30/23 1000 12/30/23 1100 12/30/23 1321  BP:    122/74  Pulse:    69  Resp: 20 (!) 21 18 (!) 22  Temp:    98.2 F (36.8 C)  TempSrc:    Oral  SpO2:    98%  Weight:      Height:        General: Pt is alert, awake, not in acute distress Cardiovascular: RRR, S1/S2 +, no rubs, no gallops Respiratory: CTA bilaterally, no wheezing, no rhonchi Abdominal: Soft, NT, ND, bowel sounds + Extremities: no edema, no cyanosis    The results of significant diagnostics from this hospitalization (including imaging, microbiology, ancillary and laboratory) are listed below for reference.     Microbiology: Recent Results (from the past 240 hours)  Resp panel by RT-PCR (RSV, Flu A&B, Covid) Anterior Nasal Swab     Status: None   Collection Time: 12/28/23  5:28 PM   Specimen: Anterior Nasal Swab  Result Value Ref Range Status   SARS Coronavirus 2 by RT PCR NEGATIVE NEGATIVE Final    Comment: (NOTE) SARS-CoV-2 target nucleic acids are NOT DETECTED.  The SARS-CoV-2 RNA is generally detectable in upper respiratory specimens during the acute phase of infection. The lowest concentration of SARS-CoV-2 viral copies this assay can detect is 138 copies/mL. A negative result does not preclude  SARS-Cov-2 infection and should not be used as the sole basis for treatment or other patient management decisions. A negative result may occur with  improper specimen collection/handling, submission of specimen other than nasopharyngeal swab, presence of viral mutation(s) within the areas targeted by this assay, and inadequate number of viral copies(<138 copies/mL). A negative result must be combined with clinical observations, patient history, and epidemiological information. The expected result is Negative.  Fact Sheet for Patients:  BloggerCourse.com  Fact Sheet for Healthcare Providers:  SeriousBroker.it  This test is no t yet approved or cleared by the [place]  FDA and  has been authorized for detection and/or diagnosis of SARS-CoV-2 by FDA under an Emergency Use Authorization (EUA). This EUA will remain  in effect (meaning this test can be used) for the duration of the COVID-19 declaration under Section 564(b)(1) of the Act, 21 U.S.C.section 360bbb-3(b)(1), unless the authorization is terminated  or revoked sooner.       Influenza A by PCR NEGATIVE NEGATIVE Final   Influenza B by PCR NEGATIVE NEGATIVE Final    Comment: (NOTE) The Xpert Xpress SARS-CoV-2/FLU/RSV plus assay is intended as an aid in the diagnosis of influenza from Nasopharyngeal swab specimens and should not be used as a sole basis for treatment. Nasal washings and aspirates are unacceptable for Xpert Xpress SARS-CoV-2/FLU/RSV testing.  Fact Sheet for Patients: BloggerCourse.com  Fact Sheet for Healthcare Providers: SeriousBroker.it  This test is not yet approved or cleared by the [place]  FDA and has been authorized for detection and/or diagnosis of SARS-CoV-2 by FDA under an Emergency Use Authorization (EUA). This EUA will remain in effect (meaning this test can be used) for the duration of  the COVID-19 declaration under Section 564(b)(1) of the Act, 21 U.S.C. section 360bbb-3(b)(1), unless the authorization is terminated or revoked.     Resp Syncytial Virus by PCR NEGATIVE NEGATIVE Final    Comment: (NOTE) Fact Sheet for Patients: BloggerCourse.com  Fact Sheet for Healthcare Providers: SeriousBroker.it  This test is not yet approved or cleared by the [place]  FDA and has been authorized for detection and/or diagnosis of SARS-CoV-2 by FDA under an Emergency Use Authorization (EUA). This EUA will remain in effect (meaning this test can be used) for the duration of the COVID-19 declaration under Section 564(b)(1) of the Act,  21 U.S.C. section 360bbb-3(b)(1), unless the authorization is terminated or revoked.  Performed at [facility], [address] [place], [place] [address]      Labs: BNP (last 3 results) Recent Labs    11/01/23 1223 12/09/23 1219 12/28/23 1728  BNP 62.4 107.2* 76.4   Basic Metabolic Panel: Recent Labs  Lab 12/28/23 1728 12/29/23 0536 12/30/23 0634  NA 143 137 133*  K 3.9 4.4 4.0  CL 105 99 95*  CO2 30 33* 30  GLUCOSE 134* 196* 175*  BUN 14 21 18   CREATININE 0.58 0.51 0.61  CALCIUM  8.7* 8.6* 8.4*  MG 1.7  --  2.0  PHOS  --   --  2.1*   Liver Function Tests: Recent Labs  Lab 12/28/23 1728  AST 17  ALT 15  ALKPHOS 55  BILITOT 0.3  PROT 6.4*  ALBUMIN 3.4*   No results for input(s): "LIPASE", "AMYLASE" in the last 168 hours. No results for input(s): "AMMONIA" in the last 168 hours. CBC: Recent Labs  Lab 12/28/23 1728 12/29/23 0536 12/30/23 0634  WBC 14.9* 8.0 10.6*  NEUTROABS 13.5*  --   --   HGB 12.5 10.7* 11.1*  HCT 41.8 36.3 35.4*  MCV 97.9 98.1 95.7  PLT 237 210 211   Cardiac Enzymes: No results for input(s): "CKTOTAL", "CKMB", "CKMBINDEX", "TROPONINI" in the last 168 hours. BNP: Invalid input(s): "POCBNP" CBG: No results for  input(s): "GLUCAP" in the last 168 hours. D-Dimer No results for input(s): "DDIMER" in the last 72 hours. Hgb A1c No results for input(s): "HGBA1C" in the last 72 hours. Lipid Profile No results for input(s): "CHOL", "HDL", "LDLCALC", "TRIG", "CHOLHDL", "LDLDIRECT" in the last 72 hours. Thyroid  function studies Recent Labs    12/28/23 2055  TSH 2.694   Anemia work up No results for input(s): "VITAMINB12", "FOLATE", "FERRITIN", "TIBC", "IRON ", "RETICCTPCT" in the last 72 hours. Urinalysis    Component Value Date/Time   COLORURINE YELLOW 06/25/2023 0536   APPEARANCEUR CLEAR 06/25/2023 0536   LABSPEC 1.023 06/25/2023 0536   PHURINE 6.0 06/25/2023 0536   GLUCOSEU 50 (A) 06/25/2023 0536   HGBUR SMALL (A) 06/25/2023 0536   BILIRUBINUR NEGATIVE 06/25/2023 0536   KETONESUR NEGATIVE 06/25/2023 0536   PROTEINUR NEGATIVE 06/25/2023 0536   UROBILINOGEN 0.2 11/06/2009 1559   NITRITE NEGATIVE 06/25/2023 0536   LEUKOCYTESUR NEGATIVE 06/25/2023 0536   Sepsis Labs Recent Labs  Lab 12/28/23 1728 12/29/23 0536 12/30/23 0634  WBC 14.9* 8.0 10.6*   Microbiology Recent Results (from the past 240 hours)  Resp panel by RT-PCR (RSV, Flu A&B, Covid) Anterior Nasal Swab     Status: None   Collection Time: 12/28/23  5:28 PM   Specimen: Anterior Nasal Swab  Result Value Ref Range Status   SARS Coronavirus 2 by RT PCR NEGATIVE NEGATIVE Final    Comment: (NOTE) SARS-CoV-2 target nucleic acids are NOT DETECTED.  The SARS-CoV-2 RNA is generally detectable in upper respiratory specimens during the acute phase of infection. The lowest concentration of SARS-CoV-2 viral copies this assay can detect is 138 copies/mL. A negative result does not preclude SARS-Cov-2 infection and should not be used as the sole basis for treatment or other patient management decisions. A negative result may occur with  improper specimen collection/handling, submission of specimen other than nasopharyngeal swab,  presence of viral mutation(s) within the areas targeted by this assay, and inadequate number of viral copies(<138 copies/mL). A negative result must be combined with clinical observations, patient history, and epidemiological  information. The expected result is Negative.  Fact Sheet for Patients:  BloggerCourse.com  Fact Sheet for Healthcare Providers:  SeriousBroker.it  This test is no t yet approved or cleared by the [place]  FDA and  has been authorized for detection and/or diagnosis of SARS-CoV-2 by FDA under an Emergency Use Authorization (EUA). This EUA will remain  in effect (meaning this test can be used) for the duration of the COVID-19 declaration under Section 564(b)(1) of the Act, 21 U.S.C.section 360bbb-3(b)(1), unless the authorization is terminated  or revoked sooner.       Influenza A by PCR NEGATIVE NEGATIVE Final   Influenza B by PCR NEGATIVE NEGATIVE Final    Comment: (NOTE) The Xpert Xpress SARS-CoV-2/FLU/RSV plus assay is intended as an aid in the diagnosis of influenza from Nasopharyngeal swab specimens and should not be used as a sole basis for treatment. Nasal washings and aspirates are unacceptable for Xpert Xpress SARS-CoV-2/FLU/RSV testing.  Fact Sheet for Patients: BloggerCourse.com  Fact Sheet for Healthcare Providers: SeriousBroker.it  This test is not yet approved or cleared by the [place]  FDA and has been authorized for detection and/or diagnosis of SARS-CoV-2 by FDA under an Emergency Use Authorization (EUA). This EUA will remain in effect (meaning this test can be used) for the duration of the COVID-19 declaration under Section 564(b)(1) of the Act, 21 U.S.C. section 360bbb-3(b)(1), unless the authorization is terminated or revoked.     Resp Syncytial Virus by PCR NEGATIVE NEGATIVE Final    Comment: (NOTE) Fact Sheet for  Patients: BloggerCourse.com  Fact Sheet for Healthcare Providers: SeriousBroker.it  This test is not yet approved or cleared by the [place]  FDA and has been authorized for detection and/or diagnosis of SARS-CoV-2 by FDA under an Emergency Use Authorization (EUA). This EUA will remain in effect (meaning this test can be used) for the duration of the COVID-19 declaration under Section 564(b)(1) of the Act, 21 U.S.C. section 360bbb-3(b)(1), unless the authorization is terminated or revoked.  Performed at [facility], [address], [place], [place] [address]      Time coordinating discharge: Over 30 minutes  SIGNED:   Magdalene School, MD  [facility] 12/30/2023, 2:27 PM Pager   If 7PM-7AM, please contact night-coverage

## 2024-01-02 ENCOUNTER — Inpatient Hospital Stay (HOSPITAL_COMMUNITY)
Admission: EM | Admit: 2024-01-02 | Discharge: 2024-01-07 | DRG: 191 | Disposition: A | Discharge status: Home / Self Care | Attending: Family Medicine | Admitting: Family Medicine

## 2024-01-02 ENCOUNTER — Other Ambulatory Visit: Payer: Self-pay

## 2024-01-02 ENCOUNTER — Emergency Department (HOSPITAL_COMMUNITY)

## 2024-01-02 DIAGNOSIS — F4310 Post-traumatic stress disorder, unspecified: Secondary | ICD-10-CM | POA: Diagnosis present

## 2024-01-02 DIAGNOSIS — J9621 Acute and chronic respiratory failure with hypoxia: Secondary | ICD-10-CM | POA: Diagnosis present

## 2024-01-02 DIAGNOSIS — E785 Hyperlipidemia, unspecified: Secondary | ICD-10-CM | POA: Diagnosis present

## 2024-01-02 DIAGNOSIS — Z720 Tobacco use: Secondary | ICD-10-CM | POA: Diagnosis present

## 2024-01-02 DIAGNOSIS — Z85118 Personal history of other malignant neoplasm of bronchus and lung: Secondary | ICD-10-CM

## 2024-01-02 DIAGNOSIS — I252 Old myocardial infarction: Secondary | ICD-10-CM

## 2024-01-02 DIAGNOSIS — F32A Depression, unspecified: Secondary | ICD-10-CM | POA: Diagnosis present

## 2024-01-02 DIAGNOSIS — Z7901 Long term (current) use of anticoagulants: Secondary | ICD-10-CM

## 2024-01-02 DIAGNOSIS — Z79899 Other long term (current) drug therapy: Secondary | ICD-10-CM

## 2024-01-02 DIAGNOSIS — F319 Bipolar disorder, unspecified: Secondary | ICD-10-CM | POA: Diagnosis present

## 2024-01-02 DIAGNOSIS — J439 Emphysema, unspecified: Secondary | ICD-10-CM | POA: Diagnosis present

## 2024-01-02 DIAGNOSIS — I5032 Chronic diastolic (congestive) heart failure: Secondary | ICD-10-CM | POA: Diagnosis present

## 2024-01-02 DIAGNOSIS — Z91018 Allergy to other foods: Secondary | ICD-10-CM

## 2024-01-02 DIAGNOSIS — Z886 Allergy status to analgesic agent status: Secondary | ICD-10-CM

## 2024-01-02 DIAGNOSIS — J441 Chronic obstructive pulmonary disease with (acute) exacerbation: Principal | ICD-10-CM | POA: Diagnosis present

## 2024-01-02 DIAGNOSIS — Z79890 Hormone replacement therapy (postmenopausal): Secondary | ICD-10-CM

## 2024-01-02 DIAGNOSIS — I480 Paroxysmal atrial fibrillation: Secondary | ICD-10-CM | POA: Diagnosis present

## 2024-01-02 DIAGNOSIS — R911 Solitary pulmonary nodule: Secondary | ICD-10-CM | POA: Diagnosis present

## 2024-01-02 DIAGNOSIS — Z9981 Dependence on supplemental oxygen: Secondary | ICD-10-CM

## 2024-01-02 DIAGNOSIS — Z1152 Encounter for screening for COVID-19: Secondary | ICD-10-CM

## 2024-01-02 DIAGNOSIS — Z8249 Family history of ischemic heart disease and other diseases of the circulatory system: Secondary | ICD-10-CM

## 2024-01-02 DIAGNOSIS — Z833 Family history of diabetes mellitus: Secondary | ICD-10-CM

## 2024-01-02 DIAGNOSIS — Z716 Tobacco abuse counseling: Secondary | ICD-10-CM

## 2024-01-02 DIAGNOSIS — Z87442 Personal history of urinary calculi: Secondary | ICD-10-CM

## 2024-01-02 DIAGNOSIS — I10 Essential (primary) hypertension: Secondary | ICD-10-CM | POA: Diagnosis present

## 2024-01-02 DIAGNOSIS — I110 Hypertensive heart disease with heart failure: Secondary | ICD-10-CM | POA: Diagnosis present

## 2024-01-02 DIAGNOSIS — E039 Hypothyroidism, unspecified: Secondary | ICD-10-CM | POA: Diagnosis present

## 2024-01-02 DIAGNOSIS — Z90710 Acquired absence of both cervix and uterus: Secondary | ICD-10-CM

## 2024-01-02 DIAGNOSIS — Z902 Acquired absence of lung [part of]: Secondary | ICD-10-CM

## 2024-01-02 DIAGNOSIS — F17210 Nicotine dependence, cigarettes, uncomplicated: Secondary | ICD-10-CM | POA: Diagnosis present

## 2024-01-02 DIAGNOSIS — J9611 Chronic respiratory failure with hypoxia: Secondary | ICD-10-CM | POA: Diagnosis present

## 2024-01-02 DIAGNOSIS — Z66 Do not resuscitate: Secondary | ICD-10-CM | POA: Diagnosis present

## 2024-01-02 DIAGNOSIS — E890 Postprocedural hypothyroidism: Secondary | ICD-10-CM | POA: Diagnosis present

## 2024-01-02 DIAGNOSIS — F419 Anxiety disorder, unspecified: Secondary | ICD-10-CM | POA: Diagnosis present

## 2024-01-02 DIAGNOSIS — R61 Generalized hyperhidrosis: Secondary | ICD-10-CM | POA: Diagnosis present

## 2024-01-02 DIAGNOSIS — G4733 Obstructive sleep apnea (adult) (pediatric): Secondary | ICD-10-CM | POA: Diagnosis present

## 2024-01-02 DIAGNOSIS — Z91048 Other nonmedicinal substance allergy status: Secondary | ICD-10-CM

## 2024-01-02 LAB — CBC WITH DIFFERENTIAL/PLATELET
Abs Immature Granulocytes: 0.07 10*3/uL (ref 0.00–0.07)
Basophils Absolute: 0 10*3/uL (ref 0.0–0.1)
Basophils Relative: 0 %
Eosinophils Absolute: 0 10*3/uL (ref 0.0–0.5)
Eosinophils Relative: 0 %
HCT: 38.8 % (ref 36.0–46.0)
Hemoglobin: 11.6 g/dL — ABNORMAL LOW (ref 12.0–15.0)
Immature Granulocytes: 1 %
Lymphocytes Relative: 5 %
Lymphs Abs: 0.6 10*3/uL — ABNORMAL LOW (ref 0.7–4.0)
MCH: 28.9 pg (ref 26.0–34.0)
MCHC: 29.9 g/dL — ABNORMAL LOW (ref 30.0–36.0)
MCV: 96.8 fL (ref 80.0–100.0)
Monocytes Absolute: 0.3 10*3/uL (ref 0.1–1.0)
Monocytes Relative: 2 %
Neutro Abs: 10.9 10*3/uL — ABNORMAL HIGH (ref 1.7–7.7)
Neutrophils Relative %: 92 %
Platelets: 218 10*3/uL (ref 150–400)
RBC: 4.01 MIL/uL (ref 3.87–5.11)
RDW: 14.2 % (ref 11.5–15.5)
WBC: 11.9 10*3/uL — ABNORMAL HIGH (ref 4.0–10.5)
nRBC: 0 % (ref 0.0–0.2)

## 2024-01-02 LAB — TROPONIN I (HIGH SENSITIVITY)
Troponin I (High Sensitivity): 11 ng/L (ref <18)
Troponin I (High Sensitivity): 8 ng/L (ref <18)

## 2024-01-02 LAB — COMPREHENSIVE METABOLIC PANEL WITH GFR
ALT: 17 U/L (ref 0–44)
AST: 14 U/L — ABNORMAL LOW (ref 15–41)
Albumin: 3.4 g/dL — ABNORMAL LOW (ref 3.5–5.0)
Alkaline Phosphatase: 52 U/L (ref 38–126)
Anion gap: 10 (ref 5–15)
BUN: 17 mg/dL (ref 8–23)
CO2: 32 mmol/L (ref 22–32)
Calcium: 9.1 mg/dL (ref 8.9–10.3)
Chloride: 98 mmol/L (ref 98–111)
Creatinine, Ser: 0.5 mg/dL (ref 0.44–1.00)
GFR, Estimated: >60 mL/min (ref >60)
Glucose, Bld: 182 mg/dL — ABNORMAL HIGH (ref 70–99)
Potassium: 3.6 mmol/L (ref 3.5–5.1)
Sodium: 140 mmol/L (ref 135–145)
Total Bilirubin: 0.2 mg/dL (ref 0.0–1.2)
Total Protein: 6.3 g/dL — ABNORMAL LOW (ref 6.5–8.1)

## 2024-01-02 LAB — RESP PANEL BY RT-PCR (RSV, FLU A&B, COVID)  RVPGX2
Influenza A by PCR: NEGATIVE
Influenza B by PCR: NEGATIVE
Resp Syncytial Virus by PCR: NEGATIVE
SARS Coronavirus 2 by RT PCR: NEGATIVE

## 2024-01-02 LAB — BRAIN NATRIURETIC PEPTIDE: B Natriuretic Peptide: 124.9 pg/mL — ABNORMAL HIGH (ref 0.0–100.0)

## 2024-01-02 MED ORDER — ARFORMOTEROL TARTRATE 15 MCG/2ML IN NEBU
15.0000 ug | INHALATION_SOLUTION | Freq: Two times a day (BID) | RESPIRATORY_TRACT | Status: DC
  Administered 2024-01-02 – 2024-01-07 (×10): 15 ug via RESPIRATORY_TRACT
  Filled 2024-01-02 (×10): qty 2

## 2024-01-02 MED ORDER — LOSARTAN POTASSIUM 25 MG PO TABS
12.5000 mg | ORAL_TABLET | Freq: Every day | ORAL | Status: DC
  Administered 2024-01-03 – 2024-01-06 (×4): 12.5 mg via ORAL
  Filled 2024-01-02 (×5): qty 0.5

## 2024-01-02 MED ORDER — ONDANSETRON HCL 4 MG/2ML IJ SOLN: 4.0000 mg | Freq: Four times a day (QID) | INTRAMUSCULAR | Status: DC | PRN

## 2024-01-02 MED ORDER — SERTRALINE HCL 25 MG PO TABS
25.0000 mg | ORAL_TABLET | Freq: Every morning | ORAL | Status: DC
  Administered 2024-01-03 – 2024-01-07 (×5): 25 mg via ORAL
  Filled 2024-01-02 (×5): qty 1

## 2024-01-02 MED ORDER — BUDESONIDE 0.25 MG/2ML IN SUSP
0.2500 mg | Freq: Two times a day (BID) | RESPIRATORY_TRACT | Status: DC
  Administered 2024-01-02 – 2024-01-07 (×10): 0.25 mg via RESPIRATORY_TRACT
  Filled 2024-01-02 (×10): qty 2

## 2024-01-02 MED ORDER — ROSUVASTATIN CALCIUM 10 MG PO TABS
20.0000 mg | ORAL_TABLET | Freq: Every day | ORAL | Status: DC
  Administered 2024-01-03 – 2024-01-07 (×5): 20 mg via ORAL
  Filled 2024-01-02 (×5): qty 2

## 2024-01-02 MED ORDER — OXYCODONE HCL 5 MG PO TABS
5.0000 mg | ORAL_TABLET | Freq: Four times a day (QID) | ORAL | Status: DC | PRN
  Administered 2024-01-02 – 2024-01-07 (×9): 5 mg via ORAL
  Filled 2024-01-02 (×9): qty 1

## 2024-01-02 MED ORDER — POLYETHYLENE GLYCOL 3350 17 G PO PACK: 17.0000 g | PACK | Freq: Every day | ORAL | Status: DC | PRN

## 2024-01-02 MED ORDER — MAGNESIUM SULFATE 2 GM/50ML IV SOLN
2.0000 g | Freq: Once | INTRAVENOUS | Status: AC
  Administered 2024-01-02: 2 g via INTRAVENOUS
  Filled 2024-01-02: qty 50

## 2024-01-02 MED ORDER — ACETAMINOPHEN 325 MG PO TABS: 650.0000 mg | ORAL_TABLET | Freq: Four times a day (QID) | ORAL | Status: DC | PRN

## 2024-01-02 MED ORDER — IPRATROPIUM-ALBUTEROL 0.5-2.5 (3) MG/3ML IN SOLN
3.0000 mL | Freq: Once | RESPIRATORY_TRACT | Status: AC
  Administered 2024-01-02: 3 mL via RESPIRATORY_TRACT
  Filled 2024-01-02: qty 3

## 2024-01-02 MED ORDER — METOPROLOL SUCCINATE ER 25 MG PO TB24
12.5000 mg | ORAL_TABLET | Freq: Every day | ORAL | Status: DC
  Administered 2024-01-03 – 2024-01-06 (×4): 12.5 mg via ORAL
  Filled 2024-01-02 (×5): qty 1

## 2024-01-02 MED ORDER — METHYLPREDNISOLONE SODIUM SUCC 125 MG IJ SOLR
125.0000 mg | Freq: Once | INTRAMUSCULAR | Status: AC
  Administered 2024-01-02: 125 mg via INTRAVENOUS
  Filled 2024-01-02: qty 2

## 2024-01-02 MED ORDER — PROPYLENE GLYCOL (PF) 0.6 % OP SOLN: 1.0000 [drp] | Freq: Four times a day (QID) | OPHTHALMIC | Status: DC

## 2024-01-02 MED ORDER — AZITHROMYCIN 250 MG PO TABS
500.0000 mg | ORAL_TABLET | Freq: Every day | ORAL | Status: AC
  Administered 2024-01-02: 500 mg via ORAL
  Filled 2024-01-02: qty 2

## 2024-01-02 MED ORDER — SODIUM CHLORIDE 0.9% FLUSH
3.0000 mL | Freq: Two times a day (BID) | INTRAVENOUS | Status: DC
  Administered 2024-01-02 – 2024-01-07 (×10): 3 mL via INTRAVENOUS

## 2024-01-02 MED ORDER — POLYVINYL ALCOHOL 1.4 % OP SOLN
1.0000 [drp] | Freq: Four times a day (QID) | OPHTHALMIC | Status: DC
  Administered 2024-01-02 – 2024-01-07 (×17): 1 [drp] via OPHTHALMIC
  Filled 2024-01-02: qty 15

## 2024-01-02 MED ORDER — ORAL CARE MOUTH RINSE: 15.0000 mL | OROMUCOSAL | Status: DC | PRN

## 2024-01-02 MED ORDER — ONDANSETRON HCL 4 MG PO TABS
4.0000 mg | ORAL_TABLET | Freq: Four times a day (QID) | ORAL | Status: DC | PRN
Start: 2024-01-02 — End: 2024-01-07

## 2024-01-02 MED ORDER — ALBUTEROL SULFATE (2.5 MG/3ML) 0.083% IN NEBU
10.0000 mg | INHALATION_SOLUTION | Freq: Once | RESPIRATORY_TRACT | Status: AC
  Administered 2024-01-02: 10 mg via RESPIRATORY_TRACT
  Filled 2024-01-02: qty 12

## 2024-01-02 MED ORDER — APIXABAN 5 MG PO TABS
5.0000 mg | ORAL_TABLET | Freq: Two times a day (BID) | ORAL | Status: DC
  Administered 2024-01-02 – 2024-01-07 (×10): 5 mg via ORAL
  Filled 2024-01-02 (×10): qty 1

## 2024-01-02 MED ORDER — IPRATROPIUM-ALBUTEROL 0.5-2.5 (3) MG/3ML IN SOLN
3.0000 mL | Freq: Four times a day (QID) | RESPIRATORY_TRACT | Status: DC | PRN
  Administered 2024-01-03 – 2024-01-04 (×2): 3 mL via RESPIRATORY_TRACT
  Filled 2024-01-02 (×2): qty 3

## 2024-01-02 MED ORDER — LEVOTHYROXINE SODIUM 125 MCG PO TABS
125.0000 ug | ORAL_TABLET | Freq: Every day | ORAL | Status: DC
  Administered 2024-01-03 – 2024-01-07 (×5): 125 ug via ORAL
  Filled 2024-01-02 (×5): qty 1

## 2024-01-02 MED ORDER — GATIFLOXACIN 0.5 % OP SOLN
4.0000 [drp] | Freq: Four times a day (QID) | OPHTHALMIC | Status: DC
  Administered 2024-01-02 – 2024-01-07 (×17): 4 [drp] via OPHTHALMIC
  Filled 2024-01-02: qty 2.5

## 2024-01-02 MED ORDER — PREDNISOLONE ACETATE 1 % OP SUSP
1.0000 [drp] | Freq: Four times a day (QID) | OPHTHALMIC | Status: DC
  Administered 2024-01-02 – 2024-01-07 (×17): 1 [drp] via OPHTHALMIC
  Filled 2024-01-02: qty 5

## 2024-01-02 MED ORDER — AZITHROMYCIN 250 MG PO TABS
250.0000 mg | ORAL_TABLET | Freq: Every day | ORAL | Status: AC
  Administered 2024-01-03 – 2024-01-06 (×4): 250 mg via ORAL
  Filled 2024-01-02 (×4): qty 1

## 2024-01-02 MED ORDER — GUAIFENESIN ER 600 MG PO TB12
600.0000 mg | ORAL_TABLET | Freq: Two times a day (BID) | ORAL | Status: DC
  Administered 2024-01-02 – 2024-01-07 (×10): 600 mg via ORAL
  Filled 2024-01-02 (×10): qty 1

## 2024-01-02 MED ORDER — ACETAMINOPHEN 650 MG RE SUPP: 650.0000 mg | Freq: Four times a day (QID) | RECTAL | Status: DC | PRN

## 2024-01-02 MED ORDER — METHYLPREDNISOLONE SODIUM SUCC 125 MG IJ SOLR
80.0000 mg | Freq: Every day | INTRAMUSCULAR | Status: DC
  Administered 2024-01-02 – 2024-01-06 (×5): 80 mg via INTRAVENOUS
  Filled 2024-01-02 (×5): qty 2

## 2024-01-02 NOTE — ED Triage Notes (Signed)
 Pt BIB EMS from home for SOB. Pt took two home albuterol  nebulizer with no relief. Pt also c/o chest pain.  15mg  of albuterol  1 atrovent  given  A&Ox4 100 hr 99% O2 130/90  Hx copd, lung cancer, CHF, emphysema

## 2024-01-02 NOTE — ED Provider Notes (Signed)
 [facility] Provider Note   CSN: [id number] Arrival date & time: 01/02/24  1200     History  No chief complaint on file.   Tracy Park is a 68 y.o. female with COPD on 2L [place] at [facility], lung cancer s/p lobectomy in 2018 recently diagnosed R lung tumor, CHF, Afib, OSA who presents BIB EMS from home for SOB. Pt took two home albuterol  nebulizer with no relief. Pt also c/o left anterior chest pain. Had night sweats last night. +orthopnea chronically, not new. Received albuterol /atrovent  neb with EMS which helped somewhat.  Continues to smoke.  No leg swelling worse than normal, no asymmetric leg swelling.  No history of DVT or PE.  Takes Eliquis  for A-fib.  Per chart review was admitted from 12/28/2023 to 12/30/2023 for COPD exacerbation, DC'd on scheduled DuoNebs/Breztri , as needed Saba, and prednisone .  Also found possible recurrence of lung cancer in right upper lobe.  PET scan and oncology appointment pending. Last had CT PE on 12/13/23 which showed no PE.    Past Medical History:  Diagnosis Date   Anginal pain (HCC)    Anxiety    Bipolar disorder (HCC)    Cancer (HCC)    COPD (chronic obstructive pulmonary disease) (HCC)    Dyspnea    Family history of adverse reaction to anesthesia    History of kidney stones    Hydroureteronephrosis 08/16/2021   Hypothyroidism    Lung cancer (HCC)    Myocardial infarction (HCC)    Paroxysmal atrial fibrillation (HCC)    PTSD (post-traumatic stress disorder)    Sleep apnea    Thyroid  disease        Home Medications Prior to Admission medications   Medication Sig Start Date End Date Taking? Authorizing Provider  albuterol  (PROVENTIL ) (2.5 MG/3ML) 0.083% nebulizer solution Take 3 mLs (2.5 mg total) by nebulization every 4 (four) hours while awake for 3 days, THEN 3 mLs (2.5 mg total) every 4 (four) hours as needed for wheezing or shortness of breath. Patient taking differently: Inhale 3 mLs (2.5 mg  total) every 4 (four) hours as needed for wheezing or shortness of breath. 10/18/23 12/28/23  Dalene Duck, MD  albuterol  (VENTOLIN  HFA) 108 [phone] Base) MCG/ACT inhaler Inhale 2 puffs into the lungs every 6 (six) hours as needed for wheezing or shortness of breath. 11/07/23   Amin, Ankit C, MD  apixaban  (ELIQUIS ) 5 MG TABS tablet Take 1 tablet (5 mg total) by mouth 2 (two) times daily. 06/06/23   Lorita Rosa, MD  benzonatate  (TESSALON ) 100 MG capsule Take 1 capsule (100 mg total) by mouth 3 (three) times daily as needed for cough. 12/19/23   Magdalene School, MD  Budeson-Glycopyrrol-Formoterol  (BREZTRI  AEROSPHERE) 160-9-4.8 MCG/ACT AERO Inhale 1 puff into the lungs in the morning and at bedtime.    [provider]  doxycycline  (VIBRA -TABS) 100 MG tablet Take 100 mg by mouth 2 (two) times daily. For 10 days 12/26/23 01/05/24  [provider]  levothyroxine  (SYNTHROID ) 125 MCG tablet Take 125 mcg by mouth daily before breakfast.    [provider]  losartan  (COZAAR ) 25 MG tablet Take 12.5 mg by mouth daily. 08/29/23   [provider]  magnesium  oxide (MAG-OX) 400 (240 Mg) MG tablet Take 400 mg by mouth in the morning.    [provider]  metoprolol  succinate (TOPROL -XL) 25 MG 24 hr tablet Take 0.5 tablets (12.5 mg total) by mouth daily. 06/22/23 12/28/23  Nettey,  Mozell Arias, MD  moxifloxacin (VIGAMOX) 0.5 % ophthalmic solution Place 1 drop into the left eye 4 (four) times daily. 11/19/23   [provider]  nitroGLYCERIN  (NITROSTAT ) 0.4 MG SL tablet Place 0.4 mg under the tongue every 5 (five) minutes as needed for chest pain.    [provider]  oxyCODONE  (ROXICODONE ) 5 MG immediate release tablet Take 1 tablet (5 mg total) by mouth every 6 (six) hours as needed for severe pain (pain score 7-10). 12/03/23   Curatolo, Adam, DO  OXYGEN  Inhale 2 L/min into the lungs continuous.    [provider]  prednisoLONE  acetate (PRED FORTE ) 1 %  ophthalmic suspension Place 1 drop into the left eye 4 (four) times daily. 11/19/23   [provider]  predniSONE  (DELTASONE ) 20 MG tablet Take 2 tablets (40 mg total) by mouth daily with breakfast for 4 days. 12/30/23 01/03/24  Magdalene School, MD  rosuvastatin  (CRESTOR ) 20 MG tablet Take 20 mg by mouth daily.    [provider]  sertraline  (ZOLOFT ) 25 MG tablet Take 25 mg by mouth in the morning.    [provider]  SYSTANE COMPLETE PF 0.6 % SOLN Place 1 drop into both eyes 4 (four) times daily.    [provider]  TYLENOL  8 HOUR 650 MG CR tablet Take 650-1,300 mg by mouth 2 (two) times daily as needed for pain.    [provider]      Allergies    Red dye #40 (allura red), Strawberry extract, Tomato, Aspirin, Tape, and Wound dressing adhesive    Review of Systems   Review of Systems A 10 point review of systems was performed and is negative unless otherwise reported in HPI.  Physical Exam Updated Vital Signs BP 133/76   Pulse 100   Temp 98.2 F (36.8 C) (Oral)   Resp 20   SpO2 99%  Physical Exam General: Chronically ill-appearing elderly female.   HEENT: PERRLA, Sclera anicteric, MMM, trachea midline.  Cardiology: RRR, no murmurs/rubs/gallops.  Resp: Mildly increased work of breathing and mild tachypnea with diffuse expiratory wheezing bilaterally, left worse than right. Abd: Soft, non-tender, non-distended. No rebound tenderness or guarding.  GU: Deferred. MSK: No peripheral edema or signs of trauma. Extremities without deformity or TTP. No cyanosis or clubbing. Skin: warm, dry.  Neuro: A&Ox4, CNs II-XII grossly intact. MAEs. Sensation grossly intact.  Psych: Normal mood and affect.   ED Results / Procedures / Treatments   Labs (all labs ordered are listed, but only abnormal results are displayed) Labs Reviewed  CBC WITH DIFFERENTIAL/PLATELET - Abnormal; Notable for the following components:      Result Value   WBC 11.9 (*)     Hemoglobin 11.6 (*)    MCHC 29.9 (*)    Neutro Abs 10.9 (*)    Lymphs Abs 0.6 (*)    All other components within normal limits  RESP PANEL BY RT-PCR (RSV, FLU A&B, COVID)  [id number]  COMPREHENSIVE METABOLIC PANEL WITH GFR  BRAIN NATRIURETIC PEPTIDE  TROPONIN I (HIGH SENSITIVITY)    EKG EKG Interpretation Date/Time:  Friday January 02 2024 12:22:37 EDT Ventricular Rate:  96 PR Interval:  118 QRS Duration:  104 QT Interval:  385 QTC Calculation: 482 R Axis:   75  Text Interpretation: Sinus rhythm Atrial premature complex Borderline short PR interval Right atrial enlargement Consider right ventricular hypertrophy Left ventricular hypertrophy Nonspecific T abnormalities, lateral leads Similar to prior Confirmed by Annita Kindle [phone]) on 01/02/2024 1:49:29 PM  Radiology CXR: 1. No acute cardiopulmonary findings. 2. Emphysema. 3. Prior left upper lobectomy with chronic scarring and left sided volume loss.  Procedures Procedures    Medications Ordered in ED Medications  ipratropium-albuterol  (DUONEB) 0.5-2.5 (3) MG/3ML nebulizer solution 3 mL (has no administration in time range)  methylPREDNISolone  sodium succinate (SOLU-MEDROL ) 125 mg/2 mL injection 125 mg (has no administration in time range)    ED Course/ Medical Decision Making/ A&P                          Medical Decision Making Amount and/or Complexity of Data Reviewed Labs: ordered. Decision-making details documented in ED Course. Radiology: ordered. Decision-making details documented in ED Course.  Risk Prescription drug management. Decision regarding hospitalization.    This patient presents to the ED for concern of chest pain and shortness of breath, this involves an extensive number of treatment options, and is a complaint that carries with it a high risk of complications and morbidity.  I considered the following differential and admission for this acute, potentially life threatening condition.   MDM:     Greatest concern for COPD exacerbation currently.  Also endorsing left anterior chest pain and must consider PE.  Recently had a CT PE scan less than 3 weeks ago which was negative, she does have possible recurrence of her lung cancer which could contribute but she does have significant wheezing bilaterally.  Will treat for COPD exacerbation and reevaluate.  Chest x-ray negative for pneumonia or pneumothorax.     Clinical Course as of 01/04/24 1250  Fri Jan 02, 2024  1336 WBC(!): 11.9 +leukocytosis [HN]  1351 DG Chest Portable 1 View Volume loss with left upper lobe lobectomy noted, and otherwise no focal consolidation noted on my assessment. [HN]  1408 Troponin I (High Sensitivity): 11 neg [HN]  1409 DG Chest Portable 1 View 1. No acute cardiopulmonary findings. 2. Emphysema. 3. Prior left upper lobectomy with chronic scarring and left sided volume loss.   [HN]  1511 Stable COPD exacerbation off oxygen .  [CC]    Clinical Course User Index [CC] Onetha Bile, MD [HN] Merdis Stalling, MD    Labs: I Ordered, and personally interpreted labs.  The pertinent results include: Those listed above  Imaging Studies ordered: I ordered imaging studies including chest x-ray I independently visualized and interpreted imaging. I agree with the radiologist interpretation  Additional history obtained from chart review.    Cardiac Monitoring: The patient was maintained on a cardiac monitor.  I personally viewed and interpreted the cardiac monitored which showed an underlying rhythm of: Normal sinus rhythm  Social Determinants of Health:  lives independently  Disposition:  Patient is signed out to the oncoming ED physician Dr. Urban Garden who is made aware of her history, presentation, exam, workup, and plan.    Co morbidities that complicate the patient evaluation  Past Medical History:  Diagnosis Date   Anginal pain (HCC)    Anxiety    Bipolar disorder (HCC)    Cancer (HCC)     COPD (chronic obstructive pulmonary disease) (HCC)    Dyspnea    Family history of adverse reaction to anesthesia    History of kidney stones    Hydroureteronephrosis 08/16/2021   Hypothyroidism    Lung cancer (HCC)    Myocardial infarction (HCC)    Paroxysmal atrial fibrillation (HCC)    PTSD (post-traumatic stress disorder)    Sleep apnea    Thyroid  disease  Medicines Meds ordered this encounter  Medications   ipratropium-albuterol  (DUONEB) 0.5-2.5 (3) MG/3ML nebulizer solution 3 mL   methylPREDNISolone  sodium succinate (SOLU-MEDROL ) 125 mg/2 mL injection 125 mg    IV methylprednisolone  will be converted to either a q12h or q24h frequency with the same total daily dose (TDD).  Ordered Dose: 1 to 125 mg TDD; convert to: TDD q24h.  Ordered Dose: 126 to 250 mg TDD; convert to: TDD div q12h.  Ordered Dose: >250 mg TDD; DAW.    I have reviewed the patients home medicines and have made adjustments as needed  Problem List / ED Course: Problem List Items Addressed This Visit       Respiratory   COPD exacerbation (HCC) - Primary   Relevant Medications   methylPREDNISolone  sodium succinate (SOLU-MEDROL ) 125 mg/2 mL injection 80 mg   budesonide  (PULMICORT ) nebulizer solution 0.25 mg   arformoterol  (BROVANA ) nebulizer solution 15 mcg   ipratropium-albuterol  (DUONEB) 0.5-2.5 (3) MG/3ML nebulizer solution 3 mL   guaiFENesin  (MUCINEX ) 12 hr tablet 600 mg   azithromycin  (ZITHROMAX ) tablet 250 mg   albuterol  (PROVENTIL ) (2.5 MG/3ML) 0.083% nebulizer solution                This note was created using dictation software, which may contain spelling or grammatical errors.    Merdis Stalling, MD 01/04/24 [id number]

## 2024-01-02 NOTE — ED Provider Notes (Signed)
 Care of patient received from prior provider at 5:59 PM, please see their note for complete H/P and care plan.  Received handoff per ED course.  Clinical Course as of 01/02/24 1759  Fri Jan 02, 2024  1336 WBC(!): 11.9 +leukocytosis [HN]  1351 DG Chest Portable 1 View Volume loss with left upper lobe lobectomy noted, and otherwise no focal consolidation noted on my assessment. [HN]  1408 Troponin I (High Sensitivity): 11 neg [HN]  1409 DG Chest Portable 1 View 1. No acute cardiopulmonary findings. 2. Emphysema. 3. Prior left upper lobectomy with chronic scarring and left sided volume loss.   [HN]  1511 Stable COPD exacerbation off oxygen .  [CC]    Clinical Course User Index [CC] Onetha Bile, MD [HN] Merdis Stalling, MD   CRITICAL CARE Performed by: Onetha Bile   Total critical care time: 30 minutes  Critical care time was exclusive of separately billable procedures and treating other patients.  Critical care was necessary to treat or prevent imminent or life-threatening deterioration.  Critical care was time spent personally by me on the following activities: development of treatment plan with patient and/or surrogate as well as nursing, discussions with consultants, evaluation of patient's response to treatment, examination of patient, obtaining history from patient or surrogate, ordering and performing treatments and interventions, ordering and review of laboratory studies, ordering and review of radiographic studies, pulse oximetry and re-evaluation of patient's condition.  Reassessment: Patient COPD exacerbation did not respond to initial therapy. She still quite tachypneic and feels lightheaded on attempts to ambulate.  She is on her 2 L nasal cannula baseline at this time. Oxygenation appropriate. Treated with escalating doses of continuous albuterol  for 1 hour as well as magnesium  infusion. She feels mildly improved at this time.  However given severity of  symptoms, will admit for COPD exacerbation and dyspnea on exertion for observation overnight. Consulted hospitalist for ongoing care and management.  Disposition:  I have considered need for hospitalization, however, considering all of the above, I believe this patient is stable for discharge at this time.  Patient/family educated about specific return precautions for given chief complaint and symptoms.  Patient/family educated about follow-up with PCP.     Patient/family expressed understanding of return precautions and need for follow-up. Patient spoken to regarding all imaging and laboratory results and appropriate follow up for these results. All education provided in verbal form with additional information in written form. Time was allowed for answering of patient questions. Patient discharged.    Emergency Department Medication Summary:   Medications  magnesium  sulfate IVPB 2 g 50 mL (2 g Intravenous New Bag/Given 01/02/24 1728)  ipratropium-albuterol  (DUONEB) 0.5-2.5 (3) MG/3ML nebulizer solution 3 mL (3 mLs Nebulization Given 01/02/24 1412)  methylPREDNISolone  sodium succinate (SOLU-MEDROL ) 125 mg/2 mL injection 125 mg (125 mg Intravenous Given 01/02/24 1412)  albuterol  (PROVENTIL ) (2.5 MG/3ML) 0.083% nebulizer solution 10 mg (10 mg Nebulization Given 01/02/24 1730)           Onetha Bile, MD 01/02/24 1801

## 2024-01-02 NOTE — H&P (Signed)
 History and Physical    Tracy Park MVH:[id number] DOB: [birth year] DOA: 01/02/2024  PCP: Chana Comas, FNP  Patient coming from: Home  I have personally briefly reviewed patient's old medical records in [facility]  Chief Complaint: This of breath  HPI: Tracy Park is a 68 y.o. female with medical history significant for COPD with chronic hypoxic respiratory failure on 2 to O2 via [place] at baseline, PAF on Eliquis , HTN, HLD, hypothyroidism, depression/anxiety, stage III lung cancer s/p left upper lobectomy, and tobacco use who presented to the ED for evaluation of shortness of breath.  Patient has been admitted frequently for COPD exacerbation, most recently from 4/20-4/22.  She reports worsening shortness of breath yesterday.  She has chronic cough but reports new thick sputum production.  She reports that she smokes about 3 cigarettes daily now.  She became very short of breath with activity today and came back to the ED for further evaluation.  ED Course  Labs/Imaging on admission: I have personally reviewed following labs and imaging studies.  Initial vitals showed BP 133/76, pulse 100, RR 20, temp 98.2 F, SpO2 99% on room air.  Labs showed WBC 11.9, hemoglobin 11.6, platelets 218, sodium 140, potassium 3.6, bicarb 32, BUN 17, creatinine 0.50, serum glucose 182, BNP 124.9, troponin 11 > 8.  COVID, influenza, RSV PCR negative.  Portable chest x-ray negative for focal consolidation, edema, effusion.  Emphysematous changes and prior left upper lobectomy of chronic scarring left-sided volume loss noted.  Patient was given IV Solu-Medrol  125 mg, IV magnesium  2 g, DuoNeb, and continuous albuterol  nebulizer treatment.  The hospitalist service was consulted to admit.  Review of Systems: All systems reviewed and are negative except as documented in history of present illness above.   Past Medical History:  Diagnosis Date   Anginal pain (HCC)    Anxiety    Bipolar disorder  (HCC)    Cancer (HCC)    COPD (chronic obstructive pulmonary disease) (HCC)    Dyspnea    Family history of adverse reaction to anesthesia    History of kidney stones    Hydroureteronephrosis 08/16/2021   Hypothyroidism    Lung cancer (HCC)    Myocardial infarction (HCC)    Paroxysmal atrial fibrillation (HCC)    PTSD (post-traumatic stress disorder)    Sleep apnea    Thyroid  disease     Past Surgical History:  Procedure Laterality Date   ABDOMINAL HYSTERECTOMY     BACK SURGERY     CYSTOSCOPY W/ URETERAL STENT PLACEMENT Right 05/03/2021   Procedure: CYSTOSCOPY WITH RETROGRADE PYELOGRAM/URETERAL STENT PLACEMENT;  Surgeon: Andrez Banker, MD;  Location: [facility];  Service: Urology;  Laterality: Right;   EYE SURGERY     kidney stent     thyroidectomy      Social History: Patient reports smoking 3 cigarettes daily.  Allergies  Allergen Reactions   Red Dye #40 (Allura Red) Hives, Itching and Other (See Comments)    Red food dye   Strawberry Extract Hives and Itching   Tomato Hives and Itching   Aspirin Hives   Tape Rash and Other (See Comments)    Prefers paper tape   Wound Dressing Adhesive Rash    Family History  Problem Relation Age of Onset   Hypertension Mother    Heart failure Mother    Hypertension Father    Diabetes Father    Heart failure Father      Prior to Admission medications   Medication  Sig Start Date End Date Taking? Authorizing Provider  albuterol  (PROVENTIL ) (2.5 MG/3ML) 0.083% nebulizer solution Take 3 mLs (2.5 mg total) by nebulization every 4 (four) hours while awake for 3 days, THEN 3 mLs (2.5 mg total) every 4 (four) hours as needed for wheezing or shortness of breath. Patient taking differently: Inhale 3 mLs (2.5 mg total) every 4 (four) hours as needed for wheezing or shortness of breath. 10/18/23 12/28/23  Dalene Duck, MD  albuterol  (VENTOLIN  HFA) [phone] Base) MCG/ACT inhaler Inhale 2 puffs into the lungs every 6 (six) hours as  needed for wheezing or shortness of breath. 11/07/23   Amin, Ankit C, MD  apixaban  (ELIQUIS ) 5 MG TABS tablet Take 1 tablet (5 mg total) by mouth 2 (two) times daily. 06/06/23   Lorita Rosa, MD  benzonatate  (TESSALON ) 100 MG capsule Take 1 capsule (100 mg total) by mouth 3 (three) times daily as needed for cough. 12/19/23   Magdalene School, MD  Budeson-Glycopyrrol-Formoterol  (BREZTRI  AEROSPHERE) 160-9-4.8 MCG/ACT AERO Inhale 1 puff into the lungs in the morning and at bedtime.    [provider]  doxycycline  (VIBRA -TABS) 100 MG tablet Take 100 mg by mouth 2 (two) times daily. For 10 days 12/26/23 01/05/24  [provider]  levothyroxine  (SYNTHROID ) 125 MCG tablet Take 125 mcg by mouth daily before breakfast.    [provider]  losartan  (COZAAR ) 25 MG tablet Take 12.5 mg by mouth daily. 08/29/23   [provider]  magnesium  oxide (MAG-OX) 400 (240 Mg) MG tablet Take 400 mg by mouth in the morning.    [provider]  metoprolol  succinate (TOPROL -XL) 25 MG 24 hr tablet Take 0.5 tablets (12.5 mg total) by mouth daily. 06/22/23 12/28/23  Verlyn Goad, MD  moxifloxacin (VIGAMOX) 0.5 % ophthalmic solution Place 1 drop into the left eye 4 (four) times daily. 11/19/23   [provider]  nitroGLYCERIN  (NITROSTAT ) 0.4 MG SL tablet Place 0.4 mg under the tongue every 5 (five) minutes as needed for chest pain.    [provider]  oxyCODONE  (ROXICODONE ) 5 MG immediate release tablet Take 1 tablet (5 mg total) by mouth every 6 (six) hours as needed for severe pain (pain score 7-10). 12/03/23   Curatolo, Adam, DO  OXYGEN  Inhale 2 L/min into the lungs continuous.    [provider]  prednisoLONE  acetate (PRED FORTE ) 1 % ophthalmic suspension Place 1 drop into the left eye 4 (four) times daily. 11/19/23   [provider]  predniSONE  (DELTASONE ) 20 MG tablet Take 2 tablets (40 mg total) by mouth daily with breakfast for 4 days. 12/30/23  01/03/24  Magdalene School, MD  rosuvastatin  (CRESTOR ) 20 MG tablet Take 20 mg by mouth daily.    [provider]  sertraline  (ZOLOFT ) 25 MG tablet Take 25 mg by mouth in the morning.    [provider]  SYSTANE COMPLETE PF 0.6 % SOLN Place 1 drop into both eyes 4 (four) times daily.    [provider]  TYLENOL  8 HOUR 650 MG CR tablet Take 650-1,300 mg by mouth 2 (two) times daily as needed for pain.    [provider]    Physical Exam: Vitals:   01/02/24 1729 01/02/24 1800 01/02/24 1900 01/02/24 1931  BP:  117/76 125/73 127/72  Pulse:  81  89  Resp:  (!) 24 19 18   Temp: 98 F (36.7 C)   98.4 F (36.9 C)  TempSrc:    Oral  SpO2:  97% 99% 99%   Constitutional: Chronically ill-appearing woman resting in bed.  NAD, calm, comfortable Eyes: EOMI, lids and conjunctivae normal ENMT: Mucous membranes are moist. Posterior pharynx clear of any exudate or lesions.Normal dentition.  Neck: normal, supple, no masses. Respiratory: Expiratory wheezing throughout.  Normal respiratory effort while on home 2 L O2 via [place]. No accessory muscle use.  Cardiovascular: Regular rate and rhythm, no murmurs / rubs / gallops. No extremity edema. 2+ pedal pulses. Abdomen: no tenderness, no masses palpated. Musculoskeletal: no clubbing / cyanosis. No joint deformity upper and lower extremities. Good ROM, no contractures. Normal muscle tone.  Skin: no rashes, lesions, ulcers. No induration Neurologic: Sensation intact. Strength 5/5 in all 4.  Psychiatric: Normal judgment and insight. Alert and oriented x 3. Normal mood.   EKG: Personally reviewed. Sinus rhythm, rate 96, PAC, RAE, LVH.  PAC is new compared to prior.  Assessment/Plan Principal Problem:   COPD with acute exacerbation and acute on chronic respiratory failure with hypoxia Active Problems:   Chronic respiratory failure with hypoxia (HCC)   Paroxysmal atrial fibrillation (HCC)   Essential hypertension    Hypothyroidism   Hyperlipidemia   Anxiety and depression and bipolar   Tobacco use   Tracy Park is a 68 y.o. female with medical history significant for COPD with chronic hypoxic respiratory failure on 2 to O2 via [place] at baseline, PAF on Eliquis , HTN, HLD, hypothyroidism, depression/anxiety, stage III lung cancer s/p left upper lobectomy, and tobacco use who is admitted with acute COPD exacerbation.  Assessment and Plan: COPD with acute exacerbation: Frequently admitted for the same.  She reports new thick sputum production. - Scheduled Brovana  and Pulmicort  - DuoNebs as needed - IV Solu-Medrol  40 mg twice daily - Start oral azithromycin  - Mucinex , incentive spirometer, flutter valve - Continue home 2 L supplemental O2 via [place]  Chronic respiratory failure with hypoxia: Stable on home 2 L.  Paroxysmal atrial fibrillation: Stable.  Continue Toprol -XL and Eliquis .  Hypertension: Continue losartan , Toprol -XL.  Hypothyroidism: Continue Synthroid .  Hyperlipidemia: Continue rosuvastatin .  Depression/anxiety: Continue sertraline .  Tobacco use: Patient reports that she is down to smoking 3 cigarettes daily.  She declines nicotine  patch.  Smoking cessation advised.   DVT prophylaxis:  apixaban  (ELIQUIS ) tablet 5 mg   Code Status:   Code Status: Limited: Do not attempt resuscitation (DNR) -DNR-LIMITED -Do Not Intubate/DNI   confirmed with patient on admission. Family Communication: Discussed with patient, she has discussed with family Disposition Plan: From home likely discharge to home pending clinical progress Consults called: None Severity of Illness: The appropriate patient status for this patient is OBSERVATION. Observation status is judged to be reasonable and necessary in order to provide the required intensity of service to ensure the patient's safety. The patient's presenting symptoms, physical exam findings, and initial radiographic and laboratory data in the context  of their medical condition is felt to place them at decreased risk for further clinical deterioration. Furthermore, it is anticipated that the patient will be medically stable for discharge from the hospital within 2 midnights of admission.   Edith Gores MD [facility]  If 7PM-7AM, please contact night-coverage [email or web address]  01/02/2024, 7:57 PM

## 2024-01-02 NOTE — Hospital Course (Signed)
 Tracy Park is a 68 y.o. female with medical history significant for COPD with chronic hypoxic respiratory failure on 2 to O2 via [place] at baseline, PAF on Eliquis , HTN, HLD, hypothyroidism, depression/anxiety, stage III lung cancer s/p left upper lobectomy, and tobacco use who is admitted with acute COPD exacerbation.

## 2024-01-03 ENCOUNTER — Encounter (HOSPITAL_COMMUNITY): Payer: Self-pay | Admitting: Internal Medicine

## 2024-01-03 DIAGNOSIS — E890 Postprocedural hypothyroidism: Secondary | ICD-10-CM | POA: Diagnosis present

## 2024-01-03 DIAGNOSIS — I5032 Chronic diastolic (congestive) heart failure: Secondary | ICD-10-CM | POA: Diagnosis present

## 2024-01-03 DIAGNOSIS — Z716 Tobacco abuse counseling: Secondary | ICD-10-CM | POA: Diagnosis not present

## 2024-01-03 DIAGNOSIS — Z79890 Hormone replacement therapy (postmenopausal): Secondary | ICD-10-CM | POA: Diagnosis not present

## 2024-01-03 DIAGNOSIS — Z1152 Encounter for screening for COVID-19: Secondary | ICD-10-CM | POA: Diagnosis not present

## 2024-01-03 DIAGNOSIS — F17210 Nicotine dependence, cigarettes, uncomplicated: Secondary | ICD-10-CM | POA: Diagnosis present

## 2024-01-03 DIAGNOSIS — Z8249 Family history of ischemic heart disease and other diseases of the circulatory system: Secondary | ICD-10-CM | POA: Diagnosis not present

## 2024-01-03 DIAGNOSIS — J441 Chronic obstructive pulmonary disease with (acute) exacerbation: Secondary | ICD-10-CM | POA: Diagnosis present

## 2024-01-03 DIAGNOSIS — Z85118 Personal history of other malignant neoplasm of bronchus and lung: Secondary | ICD-10-CM | POA: Diagnosis not present

## 2024-01-03 DIAGNOSIS — R61 Generalized hyperhidrosis: Secondary | ICD-10-CM | POA: Diagnosis present

## 2024-01-03 DIAGNOSIS — Z9981 Dependence on supplemental oxygen: Secondary | ICD-10-CM | POA: Diagnosis not present

## 2024-01-03 DIAGNOSIS — Z902 Acquired absence of lung [part of]: Secondary | ICD-10-CM | POA: Diagnosis not present

## 2024-01-03 DIAGNOSIS — Z66 Do not resuscitate: Secondary | ICD-10-CM | POA: Diagnosis present

## 2024-01-03 DIAGNOSIS — J9611 Chronic respiratory failure with hypoxia: Secondary | ICD-10-CM | POA: Diagnosis present

## 2024-01-03 DIAGNOSIS — F419 Anxiety disorder, unspecified: Secondary | ICD-10-CM | POA: Diagnosis present

## 2024-01-03 DIAGNOSIS — I110 Hypertensive heart disease with heart failure: Secondary | ICD-10-CM | POA: Diagnosis present

## 2024-01-03 DIAGNOSIS — I480 Paroxysmal atrial fibrillation: Secondary | ICD-10-CM | POA: Diagnosis present

## 2024-01-03 DIAGNOSIS — I252 Old myocardial infarction: Secondary | ICD-10-CM | POA: Diagnosis not present

## 2024-01-03 DIAGNOSIS — F4310 Post-traumatic stress disorder, unspecified: Secondary | ICD-10-CM | POA: Diagnosis present

## 2024-01-03 DIAGNOSIS — Z7901 Long term (current) use of anticoagulants: Secondary | ICD-10-CM | POA: Diagnosis not present

## 2024-01-03 DIAGNOSIS — Z79899 Other long term (current) drug therapy: Secondary | ICD-10-CM | POA: Diagnosis not present

## 2024-01-03 DIAGNOSIS — F319 Bipolar disorder, unspecified: Secondary | ICD-10-CM | POA: Diagnosis present

## 2024-01-03 DIAGNOSIS — E785 Hyperlipidemia, unspecified: Secondary | ICD-10-CM | POA: Diagnosis present

## 2024-01-03 DIAGNOSIS — J439 Emphysema, unspecified: Secondary | ICD-10-CM | POA: Diagnosis present

## 2024-01-03 LAB — BASIC METABOLIC PANEL WITH GFR
Anion gap: 12 (ref 5–15)
BUN: 19 mg/dL (ref 8–23)
CO2: 30 mmol/L (ref 22–32)
Calcium: 8.7 mg/dL — ABNORMAL LOW (ref 8.9–10.3)
Chloride: 99 mmol/L (ref 98–111)
Creatinine, Ser: 0.51 mg/dL (ref 0.44–1.00)
GFR, Estimated: >60 mL/min (ref >60)
Glucose, Bld: 136 mg/dL — ABNORMAL HIGH (ref 70–99)
Potassium: 4.4 mmol/L (ref 3.5–5.1)
Sodium: 141 mmol/L (ref 135–145)

## 2024-01-03 LAB — CBC
HCT: 35.8 % — ABNORMAL LOW (ref 36.0–46.0)
Hemoglobin: 10.9 g/dL — ABNORMAL LOW (ref 12.0–15.0)
MCH: 29.1 pg (ref 26.0–34.0)
MCHC: 30.4 g/dL (ref 30.0–36.0)
MCV: 95.5 fL (ref 80.0–100.0)
Platelets: 232 10*3/uL (ref 150–400)
RBC: 3.75 MIL/uL — ABNORMAL LOW (ref 3.87–5.11)
RDW: 14 % (ref 11.5–15.5)
WBC: 7.7 10*3/uL (ref 4.0–10.5)
nRBC: 0 % (ref 0.0–0.2)

## 2024-01-03 MED ORDER — FUROSEMIDE 10 MG/ML IJ SOLN
40.0000 mg | Freq: Once | INTRAMUSCULAR | Status: AC
  Administered 2024-01-03: 40 mg via INTRAVENOUS
  Filled 2024-01-03: qty 4

## 2024-01-03 MED ORDER — METHOCARBAMOL 500 MG PO TABS
500.0000 mg | ORAL_TABLET | Freq: Three times a day (TID) | ORAL | Status: DC | PRN
  Administered 2024-01-05 – 2024-01-07 (×3): 500 mg via ORAL
  Filled 2024-01-03 (×3): qty 1

## 2024-01-03 NOTE — Progress Notes (Signed)
 Mobility Specialist - Progress Note   01/03/24 1401  Oxygen  Therapy  O2 Device Nasal Cannula  O2 Flow Rate (L/min) 2 L/min  Mobility  Activity Ambulated independently in hallway  Level of Assistance Independent  Assistive Device None  Distance Ambulated (ft) 200 ft  Activity Response Tolerated well  Mobility Referral Yes  Mobility visit 1 Mobility  Mobility Specialist Stop Time (ACUTE ONLY) 1402   Pt received in bed and agreeable to mobility. Pt took x1 stading rest break d/t SOB. Fatigued w/ exertion. Pt to bed after session with all needs met.    Pre-mobility: 98% SpO2 Post-mobility: 94% SPO2  Chief Technology Officer

## 2024-01-03 NOTE — Progress Notes (Signed)
 PROGRESS NOTE  [place] Park UJW:[id number] DOB: [birth year] DOA: 01/02/2024 PCP: Chana Comas, FNP   LOS: 0 days   Brief Narrative / Interim history: 68 year old female with history of COPD with chronic hypoxic respiratory failure with 2 L at home, PAF on Eliquis , HTN, HLD, hypothyroidism, stage III lung cancer status post left upper lobectomy, ongoing tobacco use comes into the hospital for shortness of breath.  She has been frequently admitted, most recently 4/20-4/22.  She tells me she was feeling better upon going home, lasted for about a day and a half and then experienced recurrent wheezing, shortness of breath and came back to the hospital.  Subjective / 24h Interval events: She still wheezing this morning, feeling short of breath with minimal activity.  Complains of a cough but not much mucus  Assesement and Plan: Principal Problem:   COPD with acute exacerbation and acute on chronic respiratory failure with hypoxia Active Problems:   Chronic respiratory failure with hypoxia (HCC)   Paroxysmal atrial fibrillation (HCC)   Essential hypertension   Hypothyroidism   Hyperlipidemia   Anxiety and depression and bipolar   Tobacco use  Principal problem Acute COPD exacerbation-hospitalized for the same in the past.  Continue supportive care for now, nebulizers, steroids, 5 days of azithromycin  - Continue supplemental oxygen  - Still wheezing this morning, encourage I-S, flutter valve  Active problems Tobacco use-smoking 3 cigarettes a day, she tells me she lit 1 cigarette up after her discharge but did not smoke it and put it out immediately.  Counseled for complete cessation.  Multiple family members are smoking in her household, but she tells me that they are smoking outside  Chronic hypoxic respiratory failure-she is stable on home oxygen , 2 L  PAF-continue Toprol , Eliquis   Hypothyroidism-continue Synthroid   Hyperlipidemia-continue statin  Depression/anxiety-continue  Zoloft   Chronic diastolic CHF-clinically appears euvolemic, however with slight elevation in her BNP she will be given 1 dose of IV Lasix .  Scheduled Meds:  apixaban   5 mg Oral BID   arformoterol   15 mcg Nebulization BID   azithromycin   250 mg Oral QHS   budesonide  (PULMICORT ) nebulizer solution  0.25 mg Nebulization BID   gatifloxacin   4 drop Left Eye QID   guaiFENesin   600 mg Oral BID   levothyroxine   125 mcg Oral QAC breakfast   losartan   12.5 mg Oral Daily   methylPREDNISolone  (SOLU-MEDROL ) injection  80 mg Intravenous QHS   metoprolol  succinate  12.5 mg Oral Daily   polyvinyl alcohol   1 drop Both Eyes QID   prednisoLONE  acetate  1 drop Left Eye QID   rosuvastatin   20 mg Oral Daily   sertraline   25 mg Oral q AM   sodium chloride  flush  3 mL Intravenous Q12H   Continuous Infusions: PRN Meds:.acetaminophen  **OR** acetaminophen , ipratropium-albuterol , ondansetron  **OR** ondansetron  (ZOFRAN ) IV, mouth rinse, oxyCODONE , polyethylene glycol  Current Outpatient Medications  Medication Instructions   acetaminophen  (TYLENOL ) 500 mg, Oral, Every 6 hours PRN   albuterol  (PROVENTIL ) (2.5 MG/3ML) 0.083% nebulizer solution Take 3 mLs (2.5 mg total) by nebulization every 4 (four) hours while awake for 3 days, THEN 3 mLs (2.5 mg total) every 4 (four) hours as needed for wheezing or shortness of breath.   albuterol  (PROVENTIL ) 2.5 mg, Nebulization, Every 4 hours PRN   albuterol  (VENTOLIN  HFA) 108 (90 Base) MCG/ACT inhaler 2 puffs, Inhalation, Every 6 hours PRN   benzonatate  (TESSALON ) 100 mg, Oral, 3 times daily PRN   Budeson-Glycopyrrol-Formoterol  (BREZTRI  AEROSPHERE) 160-9-4.8 MCG/ACT AERO 1 puff,  Inhalation, 2 times daily   Eliquis  5 mg, Oral, 2 times daily   gatifloxacin  (ZYMAXID ) 0.5 % SOLN 1 drop, Left Eye, 4 times daily   levothyroxine  (SYNTHROID ) 125 mcg, Oral, Daily before breakfast   losartan  (COZAAR ) 12.5 mg, Oral, Daily   magnesium  oxide (MAG-OX) 400 mg, Oral, Every morning    metoprolol  succinate (TOPROL -XL) 12.5 mg, Oral, Daily   metoprolol  succinate (TOPROL -XL) 12.5 mg, Oral, Daily   nitroGLYCERIN  (NITROSTAT ) 0.4 mg, Sublingual, Every 5 min PRN   oxyCODONE  (ROXICODONE ) 5 mg, Oral, Every 6 hours PRN   OXYGEN  2 L/min, Inhalation, Continuous   polyvinyl alcohol  (LIQUIFILM TEARS) 1.4 % ophthalmic solution 1 drop, Both Eyes, 4 times daily   prednisoLONE  acetate (PRED FORTE ) 1 % ophthalmic suspension 1 drop, Left Eye, 4 times daily   predniSONE  (DELTASONE ) 40 mg, Oral, Daily with breakfast   rosuvastatin  (CRESTOR ) 20 mg, Oral, Daily   sertraline  (ZOLOFT ) 25 mg, Oral, Every morning   SYSTANE COMPLETE PF 0.6 % SOLN 1 drop, Both Eyes, 4 times daily PRN    Diet Orders (From admission, onward)     Start     Ordered   01/02/24 1950  Diet Heart Room service appropriate? Yes; Fluid consistency: Thin  Diet effective now       Question Answer Comment  Room service appropriate? Yes   Fluid consistency: Thin      01/02/24 1950            DVT prophylaxis:  apixaban  (ELIQUIS ) tablet 5 mg   Lab Results  Component Value Date   PLT 232 01/03/2024      Code Status: Limited: Do not attempt resuscitation (DNR) -DNR-LIMITED -Do Not Intubate/DNI   Family Communication: No family at bedside  Status is: Observation The patient will require care spanning > 2 midnights and should be moved to inpatient because: Persistent wheezing, shortness of breath   Level of care: Telemetry  Consultants:  none  Objective: Vitals:   01/02/24 2050 01/02/24 2312 01/03/24 0302 01/03/24 0804  BP:  121/78 109/78   Pulse:  66 68   Resp:  16 16   Temp:  97.7 F (36.5 C) 98.5 F (36.9 C)   TempSrc:  Oral Oral   SpO2: 98% 97% 100% 98%  Weight:      Height:        Intake/Output Summary (Last 24 hours) at 01/03/2024 1012 Last data filed at 01/03/2024 0705 Gross per 24 hour  Intake 404.64 ml  Output 750 ml  Net -345.36 ml   Wt Readings from Last 3 Encounters:  01/02/24 46.5  kg  12/29/23 48.9 kg  12/13/23 44.1 kg    Examination:  Constitutional: NAD Eyes: no scleral icterus ENMT: Mucous membranes are moist.  Neck: normal, supple Respiratory: Diffuse bilateral expiratory wheezing, no crackles Cardiovascular: Regular rate and rhythm, no murmurs / rubs / gallops. No LE edema.  Abdomen: non distended, no tenderness. Bowel sounds positive.  Musculoskeletal: no clubbing / cyanosis.  Skin: no rashes Neurologic: non focal   Data Reviewed: I have independently reviewed following labs and imaging studies   CBC Recent Labs  Lab 12/28/23 1728 12/29/23 0536 12/30/23 0634 01/02/24 1300 01/03/24 0611  WBC 14.9* 8.0 10.6* 11.9* 7.7  HGB 12.5 10.7* 11.1* 11.6* 10.9*  HCT 41.8 36.3 35.4* 38.8 35.8*  PLT 237 210 211 218 232  MCV 97.9 98.1 95.7 96.8 95.5  MCH 29.3 28.9 30.0 28.9 29.1  MCHC 29.9* 29.5* 31.4 29.9* 30.4  RDW 14.1  14.0 13.7 14.2 14.0  LYMPHSABS 0.9  --   --  0.6*  --   MONOABS 0.4  --   --  0.3  --   EOSABS 0.1  --   --  0.0  --   BASOSABS 0.0  --   --  0.0  --     Recent Labs  Lab 12/28/23 1728 12/28/23 2055 12/29/23 0536 12/30/23 0634 01/02/24 1300 01/03/24 0611  NA 143  --  137 133* 140 141  K 3.9  --  4.4 4.0 3.6 4.4  CL 105  --  99 95* 98 99  CO2 30  --  33* 30 32 30  GLUCOSE 134*  --  196* 175* 182* 136*  BUN 14  --  21 18 17 19   CREATININE 0.58  --  0.51 0.61 0.50 0.51  CALCIUM  8.7*  --  8.6* 8.4* 9.1 8.7*  AST 17  --   --   --  14*  --   ALT 15  --   --   --  17  --   ALKPHOS 55  --   --   --  52  --   BILITOT 0.3  --   --   --  0.2  --   ALBUMIN 3.4*  --   --   --  3.4*  --   MG 1.7  --   --  2.0  --   --   TSH  --  2.694  --   --   --   --   BNP 76.4  --   --   --  124.9*  --     ------------------------------------------------------------------------------------------------------------------ No results for input(s): "CHOL", "HDL", "LDLCALC", "TRIG", "CHOLHDL", "LDLDIRECT" in the last 72 hours.  Lab Results   Component Value Date   HGBA1C 5.7 (H) 01/27/2023   ------------------------------------------------------------------------------------------------------------------ No results for input(s): "TSH", "T4TOTAL", "T3FREE", "THYROIDAB" in the last 72 hours.  Invalid input(s): "FREET3"  Cardiac Enzymes No results for input(s): "CKMB", "TROPONINI", "MYOGLOBIN" in the last 168 hours.  Invalid input(s): "CK" ------------------------------------------------------------------------------------------------------------------    Component Value Date/Time   BNP 124.9 (H) 01/02/2024 1300    CBG: No results for input(s): "GLUCAP" in the last 168 hours.  Recent Results (from the past 240 hours)  Resp panel by RT-PCR (RSV, Flu A&B, Covid) Anterior Nasal Swab     Status: None   Collection Time: 12/28/23  5:28 PM   Specimen: Anterior Nasal Swab  Result Value Ref Range Status   SARS Coronavirus 2 by RT PCR NEGATIVE NEGATIVE Final    Comment: (NOTE) SARS-CoV-2 target nucleic acids are NOT DETECTED.  The SARS-CoV-2 RNA is generally detectable in upper respiratory specimens during the acute phase of infection. The lowest concentration of SARS-CoV-2 viral copies this assay can detect is 138 copies/mL. A negative result does not preclude SARS-Cov-2 infection and should not be used as the sole basis for treatment or other patient management decisions. A negative result may occur with  improper specimen collection/handling, submission of specimen other than nasopharyngeal swab, presence of viral mutation(s) within the areas targeted by this assay, and inadequate number of viral copies(<138 copies/mL). A negative result must be combined with clinical observations, patient history, and epidemiological information. The expected result is Negative.  Fact Sheet for Patients:  BloggerCourse.com  Fact Sheet for Healthcare Providers:   SeriousBroker.it  This test is no t yet approved or cleared by the [place]  FDA and  has been authorized for  detection and/or diagnosis of SARS-CoV-2 by FDA under an Emergency Use Authorization (EUA). This EUA will remain  in effect (meaning this test can be used) for the duration of the COVID-19 declaration under Section 564(b)(1) of the Act, 21 U.S.C.section 360bbb-3(b)(1), unless the authorization is terminated  or revoked sooner.       Influenza A by PCR NEGATIVE NEGATIVE Final   Influenza B by PCR NEGATIVE NEGATIVE Final    Comment: (NOTE) The Xpert Xpress SARS-CoV-2/FLU/RSV plus assay is intended as an aid in the diagnosis of influenza from Nasopharyngeal swab specimens and should not be used as a sole basis for treatment. Nasal washings and aspirates are unacceptable for Xpert Xpress SARS-CoV-2/FLU/RSV testing.  Fact Sheet for Patients: BloggerCourse.com  Fact Sheet for Healthcare Providers: SeriousBroker.it  This test is not yet approved or cleared by the [place]  FDA and has been authorized for detection and/or diagnosis of SARS-CoV-2 by FDA under an Emergency Use Authorization (EUA). This EUA will remain in effect (meaning this test can be used) for the duration of the COVID-19 declaration under Section 564(b)(1) of the Act, 21 U.S.C. section 360bbb-3(b)(1), unless the authorization is terminated or revoked.     Resp Syncytial Virus by PCR NEGATIVE NEGATIVE Final    Comment: (NOTE) Fact Sheet for Patients: BloggerCourse.com  Fact Sheet for Healthcare Providers: SeriousBroker.it  This test is not yet approved or cleared by the [place]  FDA and has been authorized for detection and/or diagnosis of SARS-CoV-2 by FDA under an Emergency Use Authorization (EUA). This EUA will remain in effect (meaning this test can be used) for  the duration of the COVID-19 declaration under Section 564(b)(1) of the Act, 21 U.S.C. section 360bbb-3(b)(1), unless the authorization is terminated or revoked.  Performed at [facility], [address] [place], [place] [address]   Resp panel by RT-PCR (RSV, Flu A&B, Covid) Anterior Nasal Swab     Status: None   Collection Time: 01/02/24  1:41 PM   Specimen: Anterior Nasal Swab  Result Value Ref Range Status   SARS Coronavirus 2 by RT PCR NEGATIVE NEGATIVE Final    Comment: (NOTE) SARS-CoV-2 target nucleic acids are NOT DETECTED.  The SARS-CoV-2 RNA is generally detectable in upper respiratory specimens during the acute phase of infection. The lowest concentration of SARS-CoV-2 viral copies this assay can detect is 138 copies/mL. A negative result does not preclude SARS-Cov-2 infection and should not be used as the sole basis for treatment or other patient management decisions. A negative result may occur with  improper specimen collection/handling, submission of specimen other than nasopharyngeal swab, presence of viral mutation(s) within the areas targeted by this assay, and inadequate number of viral copies(<138 copies/mL). A negative result must be combined with clinical observations, patient history, and epidemiological information. The expected result is Negative.  Fact Sheet for Patients:  BloggerCourse.com  Fact Sheet for Healthcare Providers:  SeriousBroker.it  This test is no t yet approved or cleared by the [place]  FDA and  has been authorized for detection and/or diagnosis of SARS-CoV-2 by FDA under an Emergency Use Authorization (EUA). This EUA will remain  in effect (meaning this test can be used) for the duration of the COVID-19 declaration under Section 564(b)(1) of the Act, 21 U.S.C.section 360bbb-3(b)(1), unless the authorization is terminated  or revoked sooner.       Influenza  A by PCR NEGATIVE NEGATIVE Final   Influenza B by PCR NEGATIVE NEGATIVE Final    Comment: (NOTE) The  Xpert Xpress SARS-CoV-2/FLU/RSV plus assay is intended as an aid in the diagnosis of influenza from Nasopharyngeal swab specimens and should not be used as a sole basis for treatment. Nasal washings and aspirates are unacceptable for Xpert Xpress SARS-CoV-2/FLU/RSV testing.  Fact Sheet for Patients: BloggerCourse.com  Fact Sheet for Healthcare Providers: SeriousBroker.it  This test is not yet approved or cleared by the [place]  FDA and has been authorized for detection and/or diagnosis of SARS-CoV-2 by FDA under an Emergency Use Authorization (EUA). This EUA will remain in effect (meaning this test can be used) for the duration of the COVID-19 declaration under Section 564(b)(1) of the Act, 21 U.S.C. section 360bbb-3(b)(1), unless the authorization is terminated or revoked.     Resp Syncytial Virus by PCR NEGATIVE NEGATIVE Final    Comment: (NOTE) Fact Sheet for Patients: BloggerCourse.com  Fact Sheet for Healthcare Providers: SeriousBroker.it  This test is not yet approved or cleared by the [place]  FDA and has been authorized for detection and/or diagnosis of SARS-CoV-2 by FDA under an Emergency Use Authorization (EUA). This EUA will remain in effect (meaning this test can be used) for the duration of the COVID-19 declaration under Section 564(b)(1) of the Act, 21 U.S.C. section 360bbb-3(b)(1), unless the authorization is terminated or revoked.  Performed at [facility], [address] [place], [place] [address]      Radiology Studies: DG Chest Portable 1 View Result Date: 01/02/2024 CLINICAL DATA:  Shortness of breath.  History of lung cancer. EXAM: PORTABLE CHEST 1 VIEW COMPARISON:  Chest radiograph dated 12/28/2023. FINDINGS: Stable  cardiomediastinal silhouette. Prior left upper lobectomy with chronic scarring and left sided volume loss. Emphysema with bullous changes again noted in the superior segment of the left lower lobe. No focal consolidation, sizeable pleural effusion, or pneumothorax. No acute osseous abnormality. IMPRESSION: 1. No acute cardiopulmonary findings. 2. Emphysema. 3. Prior left upper lobectomy with chronic scarring and left sided volume loss. Electronically Signed   By: Mannie Seek M.D.   On: 01/02/2024 13:57     Kathlen Para, MD, PhD [facility]  Between 7 am - 7 pm I am available, please contact me via Amion (for emergencies) or Securechat (non urgent messages)  Between 7 pm - 7 am I am not available, please contact night coverage MD/APP via Amion

## 2024-01-03 NOTE — Care Management Obs Status (Signed)
 MEDICARE OBSERVATION STATUS NOTIFICATION   Patient Details  Name: MIN COPELAND MRN: [id number] Date of Birth: 1956-02-27   Medicare Observation Status Notification Given:  Yes    Halford Goetzke Liane Redman, LCSW 01/03/2024, 9:24 AM

## 2024-01-03 NOTE — Plan of Care (Signed)

## 2024-01-04 DIAGNOSIS — J441 Chronic obstructive pulmonary disease with (acute) exacerbation: Secondary | ICD-10-CM | POA: Diagnosis not present

## 2024-01-04 LAB — BASIC METABOLIC PANEL WITH GFR
Anion gap: 10 (ref 5–15)
BUN: 24 mg/dL — ABNORMAL HIGH (ref 8–23)
CO2: 31 mmol/L (ref 22–32)
Calcium: 8.5 mg/dL — ABNORMAL LOW (ref 8.9–10.3)
Chloride: 99 mmol/L (ref 98–111)
Creatinine, Ser: 0.71 mg/dL (ref 0.44–1.00)
GFR, Estimated: >60 mL/min (ref >60)
Glucose, Bld: 77 mg/dL (ref 70–99)
Potassium: 3.7 mmol/L (ref 3.5–5.1)
Sodium: 140 mmol/L (ref 135–145)

## 2024-01-04 LAB — MAGNESIUM: Magnesium: 2 mg/dL (ref 1.7–2.4)

## 2024-01-04 NOTE — Progress Notes (Signed)
 PROGRESS NOTE  [place] ZOX:[id number] DOB: 08/06/1956 DOA: 01/02/2024 PCP: Chana Comas, FNP   LOS: 1 day   Brief Narrative / Interim history: 67 year old female with history of COPD with chronic hypoxic respiratory failure with 2 L at home, PAF on Eliquis , HTN, HLD, hypothyroidism, stage III lung cancer status post left upper lobectomy, ongoing tobacco use comes into the hospital for shortness of breath.  She has been frequently admitted, most recently 4/20-4/22.  She tells me she was feeling better upon going home, lasted for about a day and a half and then experienced recurrent wheezing, shortness of breath and came back to the hospital.  Subjective / 24h Interval events: Tells me that she is gradually improving.  Was able to walk in the room as well as few steps outside the room.  Assesement and Plan: Principal Problem:   COPD with acute exacerbation and acute on chronic respiratory failure with hypoxia Active Problems:   COPD exacerbation (HCC)   Chronic respiratory failure with hypoxia (HCC)   Paroxysmal atrial fibrillation (HCC)   Essential hypertension   Hypothyroidism   Hyperlipidemia   Anxiety and depression and bipolar   Tobacco use  Principal problem Acute COPD exacerbation-hospitalized for the same in the past.  Continue supportive care for now, nebulizers, steroids, 5 days of azithromycin  - Continue supplemental oxygen  - She remains with significant wheezing, continue nebulizers, encourage ambulation  Active problems Tobacco use-smoking 3 cigarettes a day, she tells me she lit 1 cigarette up after her discharge but did not smoke it and put it out immediately.  Counseled for complete cessation.  Multiple family members are smoking in her household, but she tells me that they are smoking outside  Chronic hypoxic respiratory failure-she is stable on home oxygen , 2 L  PAF-continue Toprol , Eliquis   Hypothyroidism-continue Synthroid   Hyperlipidemia-continue  statin  Depression/anxiety-continue Zoloft   Chronic diastolic CHF-clinically appears euvolemic, however with slight elevation in her BNP she was on a dose of Lasix  4/26.  Electrolytes remained stable today  Scheduled Meds:  apixaban   5 mg Oral BID   arformoterol   15 mcg Nebulization BID   azithromycin   250 mg Oral QHS   budesonide  (PULMICORT ) nebulizer solution  0.25 mg Nebulization BID   gatifloxacin   4 drop Left Eye QID   guaiFENesin   600 mg Oral BID   levothyroxine   125 mcg Oral QAC breakfast   losartan   12.5 mg Oral Daily   methylPREDNISolone  (SOLU-MEDROL ) injection  80 mg Intravenous QHS   metoprolol  succinate  12.5 mg Oral Daily   polyvinyl alcohol   1 drop Both Eyes QID   prednisoLONE  acetate  1 drop Left Eye QID   rosuvastatin   20 mg Oral Daily   sertraline   25 mg Oral q AM   sodium chloride  flush  3 mL Intravenous Q12H   Continuous Infusions: PRN Meds:.acetaminophen  **OR** acetaminophen , ipratropium-albuterol , methocarbamol , ondansetron  **OR** ondansetron  (ZOFRAN ) IV, mouth rinse, oxyCODONE , polyethylene glycol  Current Outpatient Medications  Medication Instructions   acetaminophen  (TYLENOL ) 500 mg, Oral, Every 6 hours PRN   albuterol  (PROVENTIL ) (2.5 MG/3ML) 0.083% nebulizer solution Take 3 mLs (2.5 mg total) by nebulization every 4 (four) hours while awake for 3 days, THEN 3 mLs (2.5 mg total) every 4 (four) hours as needed for wheezing or shortness of breath.   albuterol  (PROVENTIL ) 2.5 mg, Nebulization, Every 4 hours PRN   albuterol  (VENTOLIN  HFA) 108 (90 Base) MCG/ACT inhaler 2 puffs, Inhalation, Every 6 hours PRN   benzonatate  (TESSALON ) 100 mg, Oral, 3  times daily PRN   Budeson-Glycopyrrol-Formoterol  (BREZTRI  AEROSPHERE) 160-9-4.8 MCG/ACT AERO 1 puff, Inhalation, 2 times daily   Eliquis  5 mg, Oral, 2 times daily   gatifloxacin  (ZYMAXID ) 0.5 % SOLN 1 drop, Left Eye, 4 times daily   levothyroxine  (SYNTHROID ) 125 mcg, Oral, Daily before breakfast   losartan  (COZAAR )  12.5 mg, Oral, Daily   magnesium  oxide (MAG-OX) 400 mg, Oral, Every morning   metoprolol  succinate (TOPROL -XL) 12.5 mg, Oral, Daily   metoprolol  succinate (TOPROL -XL) 12.5 mg, Oral, Daily   nitroGLYCERIN  (NITROSTAT ) 0.4 mg, Sublingual, Every 5 min PRN   oxyCODONE  (ROXICODONE ) 5 mg, Oral, Every 6 hours PRN   OXYGEN  2 L/min, Inhalation, Continuous   polyvinyl alcohol  (LIQUIFILM TEARS) 1.4 % ophthalmic solution 1 drop, Both Eyes, 4 times daily   prednisoLONE  acetate (PRED FORTE ) 1 % ophthalmic suspension 1 drop, Left Eye, 4 times daily   rosuvastatin  (CRESTOR ) 20 mg, Oral, Daily   sertraline  (ZOLOFT ) 25 mg, Oral, Every morning   SYSTANE COMPLETE PF 0.6 % SOLN 1 drop, Both Eyes, 4 times daily PRN    Diet Orders (From admission, onward)     Start     Ordered   01/02/24 1950  Diet Heart Room service appropriate? Yes; Fluid consistency: Thin  Diet effective now       Question Answer Comment  Room service appropriate? Yes   Fluid consistency: Thin      01/02/24 1950            DVT prophylaxis:  apixaban  (ELIQUIS ) tablet 5 mg   Lab Results  Component Value Date   PLT 232 01/03/2024      Code Status: Limited: Do not attempt resuscitation (DNR) -DNR-LIMITED -Do Not Intubate/DNI   Family Communication: No family at bedside  Status is: Inpatient   Level of care: Med-Surg  Consultants:  none  Objective: Vitals:   01/03/24 2126 01/04/24 0352 01/04/24 0630 01/04/24 0757  BP:  120/76    Pulse:  68    Resp:  16 20   Temp:  (!) 97.3 F (36.3 C)    TempSrc:      SpO2: 95% 100%  100%  Weight:      Height:        Intake/Output Summary (Last 24 hours) at 01/04/2024 1043 Last data filed at 01/04/2024 1610 Gross per 24 hour  Intake 240 ml  Output 1300 ml  Net -1060 ml   Wt Readings from Last 3 Encounters:  01/02/24 46.5 kg  12/29/23 48.9 kg  12/13/23 44.1 kg    Examination:  Constitutional: NAD Eyes: lids and conjunctivae normal, no scleral icterus ENMT:  mmm Neck: normal, supple Respiratory: clear to auscultation bilaterally, no wheezing, no crackles. Normal respiratory effort.  Cardiovascular: Regular rate and rhythm, no murmurs / rubs / gallops. No LE edema. Abdomen: soft, no distention, no tenderness. Bowel sounds positive.    Data Reviewed: I have independently reviewed following labs and imaging studies   CBC Recent Labs  Lab 12/28/23 1728 12/29/23 0536 12/30/23 0634 01/02/24 1300 01/03/24 0611  WBC 14.9* 8.0 10.6* 11.9* 7.7  HGB 12.5 10.7* 11.1* 11.6* 10.9*  HCT 41.8 36.3 35.4* 38.8 35.8*  PLT 237 210 211 218 232  MCV 97.9 98.1 95.7 96.8 95.5  MCH 29.3 28.9 30.0 28.9 29.1  MCHC 29.9* 29.5* 31.4 29.9* 30.4  RDW 14.1 14.0 13.7 14.2 14.0  LYMPHSABS 0.9  --   --  0.6*  --   MONOABS 0.4  --   --  0.3  --   EOSABS 0.1  --   --  0.0  --   BASOSABS 0.0  --   --  0.0  --     Recent Labs  Lab 12/28/23 1728 12/28/23 2055 12/29/23 0536 12/30/23 0634 01/02/24 1300 01/03/24 0611 01/04/24 0807  NA 143  --  137 133* 140 141 140  K 3.9  --  4.4 4.0 3.6 4.4 3.7  CL 105  --  99 95* 98 99 99  CO2 30  --  33* 30 32 30 31  GLUCOSE 134*  --  196* 175* 182* 136* 77  BUN 14  --  21 18 17 19  24*  CREATININE 0.58  --  0.51 0.61 0.50 0.51 0.71  CALCIUM  8.7*  --  8.6* 8.4* 9.1 8.7* 8.5*  AST 17  --   --   --  14*  --   --   ALT 15  --   --   --  17  --   --   ALKPHOS 55  --   --   --  52  --   --   BILITOT 0.3  --   --   --  0.2  --   --   ALBUMIN 3.4*  --   --   --  3.4*  --   --   MG 1.7  --   --  2.0  --   --  2.0  TSH  --  2.694  --   --   --   --   --   BNP 76.4  --   --   --  124.9*  --   --     ------------------------------------------------------------------------------------------------------------------ No results for input(s): "CHOL", "HDL", "LDLCALC", "TRIG", "CHOLHDL", "LDLDIRECT" in the last 72 hours.  Lab Results  Component Value Date   HGBA1C 5.7 (H) 01/27/2023    ------------------------------------------------------------------------------------------------------------------ No results for input(s): "TSH", "T4TOTAL", "T3FREE", "THYROIDAB" in the last 72 hours.  Invalid input(s): "FREET3"  Cardiac Enzymes No results for input(s): "CKMB", "TROPONINI", "MYOGLOBIN" in the last 168 hours.  Invalid input(s): "CK" ------------------------------------------------------------------------------------------------------------------    Component Value Date/Time   BNP 124.9 (H) 01/02/2024 1300    CBG: No results for input(s): "GLUCAP" in the last 168 hours.  Recent Results (from the past 240 hours)  Resp panel by RT-PCR (RSV, Flu A&B, Covid) Anterior Nasal Swab     Status: None   Collection Time: 12/28/23  5:28 PM   Specimen: Anterior Nasal Swab  Result Value Ref Range Status   SARS Coronavirus 2 by RT PCR NEGATIVE NEGATIVE Final    Comment: (NOTE) SARS-CoV-2 target nucleic acids are NOT DETECTED.  The SARS-CoV-2 RNA is generally detectable in upper respiratory specimens during the acute phase of infection. The lowest concentration of SARS-CoV-2 viral copies this assay can detect is 138 copies/mL. A negative result does not preclude SARS-Cov-2 infection and should not be used as the sole basis for treatment or other patient management decisions. A negative result may occur with  improper specimen collection/handling, submission of specimen other than nasopharyngeal swab, presence of viral mutation(s) within the areas targeted by this assay, and inadequate number of viral copies(<138 copies/mL). A negative result must be combined with clinical observations, patient history, and epidemiological information. The expected result is Negative.  Fact Sheet for Patients:  BloggerCourse.com  Fact Sheet for Healthcare Providers:  SeriousBroker.it  This test is no t yet approved or cleared by the  [place]  FDA  and  has been authorized for detection and/or diagnosis of SARS-CoV-2 by FDA under an Emergency Use Authorization (EUA). This EUA will remain  in effect (meaning this test can be used) for the duration of the COVID-19 declaration under Section 564(b)(1) of the Act, 21 U.S.C.section 360bbb-3(b)(1), unless the authorization is terminated  or revoked sooner.       Influenza A by PCR NEGATIVE NEGATIVE Final   Influenza B by PCR NEGATIVE NEGATIVE Final    Comment: (NOTE) The Xpert Xpress SARS-CoV-2/FLU/RSV plus assay is intended as an aid in the diagnosis of influenza from Nasopharyngeal swab specimens and should not be used as a sole basis for treatment. Nasal washings and aspirates are unacceptable for Xpert Xpress SARS-CoV-2/FLU/RSV testing.  Fact Sheet for Patients: BloggerCourse.com  Fact Sheet for Healthcare Providers: SeriousBroker.it  This test is not yet approved or cleared by the [place]  FDA and has been authorized for detection and/or diagnosis of SARS-CoV-2 by FDA under an Emergency Use Authorization (EUA). This EUA will remain in effect (meaning this test can be used) for the duration of the COVID-19 declaration under Section 564(b)(1) of the Act, 21 U.S.C. section 360bbb-3(b)(1), unless the authorization is terminated or revoked.     Resp Syncytial Virus by PCR NEGATIVE NEGATIVE Final    Comment: (NOTE) Fact Sheet for Patients: BloggerCourse.com  Fact Sheet for Healthcare Providers: SeriousBroker.it  This test is not yet approved or cleared by the [place]  FDA and has been authorized for detection and/or diagnosis of SARS-CoV-2 by FDA under an Emergency Use Authorization (EUA). This EUA will remain in effect (meaning this test can be used) for the duration of the COVID-19 declaration under Section 564(b)(1) of the Act, 21 U.S.C. section  360bbb-3(b)(1), unless the authorization is terminated or revoked.  Performed at [facility], [address], [place], [place] [address]   Resp panel by RT-PCR (RSV, Flu A&B, Covid) Anterior Nasal Swab     Status: None   Collection Time: 01/02/24  1:41 PM   Specimen: Anterior Nasal Swab  Result Value Ref Range Status   SARS Coronavirus 2 by RT PCR NEGATIVE NEGATIVE Final    Comment: (NOTE) SARS-CoV-2 target nucleic acids are NOT DETECTED.  The SARS-CoV-2 RNA is generally detectable in upper respiratory specimens during the acute phase of infection. The lowest concentration of SARS-CoV-2 viral copies this assay can detect is 138 copies/mL. A negative result does not preclude SARS-Cov-2 infection and should not be used as the sole basis for treatment or other patient management decisions. A negative result may occur with  improper specimen collection/handling, submission of specimen other than nasopharyngeal swab, presence of viral mutation(s) within the areas targeted by this assay, and inadequate number of viral copies(<138 copies/mL). A negative result must be combined with clinical observations, patient history, and epidemiological information. The expected result is Negative.  Fact Sheet for Patients:  BloggerCourse.com  Fact Sheet for Healthcare Providers:  SeriousBroker.it  This test is no t yet approved or cleared by the [place]  FDA and  has been authorized for detection and/or diagnosis of SARS-CoV-2 by FDA under an Emergency Use Authorization (EUA). This EUA will remain  in effect (meaning this test can be used) for the duration of the COVID-19 declaration under Section 564(b)(1) of the Act, 21 U.S.C.section 360bbb-3(b)(1), unless the authorization is terminated  or revoked sooner.       Influenza A by PCR NEGATIVE NEGATIVE Final   Influenza B by PCR NEGATIVE NEGATIVE Final  Comment:  (NOTE) The Xpert Xpress SARS-CoV-2/FLU/RSV plus assay is intended as an aid in the diagnosis of influenza from Nasopharyngeal swab specimens and should not be used as a sole basis for treatment. Nasal washings and aspirates are unacceptable for Xpert Xpress SARS-CoV-2/FLU/RSV testing.  Fact Sheet for Patients: BloggerCourse.com  Fact Sheet for Healthcare Providers: SeriousBroker.it  This test is not yet approved or cleared by the [place]  FDA and has been authorized for detection and/or diagnosis of SARS-CoV-2 by FDA under an Emergency Use Authorization (EUA). This EUA will remain in effect (meaning this test can be used) for the duration of the COVID-19 declaration under Section 564(b)(1) of the Act, 21 U.S.C. section 360bbb-3(b)(1), unless the authorization is terminated or revoked.     Resp Syncytial Virus by PCR NEGATIVE NEGATIVE Final    Comment: (NOTE) Fact Sheet for Patients: BloggerCourse.com  Fact Sheet for Healthcare Providers: SeriousBroker.it  This test is not yet approved or cleared by the [place]  FDA and has been authorized for detection and/or diagnosis of SARS-CoV-2 by FDA under an Emergency Use Authorization (EUA). This EUA will remain in effect (meaning this test can be used) for the duration of the COVID-19 declaration under Section 564(b)(1) of the Act, 21 U.S.C. section 360bbb-3(b)(1), unless the authorization is terminated or revoked.  Performed at [facility], [address] [place], [place] [address]      Radiology Studies: No results found.    Kathlen Para, MD, PhD [facility]  Between 7 am - 7 pm I am available, please contact me via Amion (for emergencies) or Securechat (non urgent messages)  Between 7 pm - 7 am I am not available, please contact night coverage MD/APP via Amion

## 2024-01-04 NOTE — Plan of Care (Signed)
   Problem: Nutrition: Goal: Adequate nutrition will be maintained Outcome: Progressing   Problem: Pain Managment: Goal: General experience of comfort will improve and/or be controlled Outcome: Progressing   Problem: Safety: Goal: Ability to remain free from injury will improve Outcome: Progressing

## 2024-01-04 NOTE — Progress Notes (Signed)
 Mobility Specialist - Progress Note   01/04/24 1403  Mobility  Activity Ambulated independently in hallway  Level of Assistance Independent  Assistive Device None  Distance Ambulated (ft) 80 ft  Activity Response Tolerated well  Mobility Referral Yes  Mobility visit 1 Mobility  Mobility Specialist Start Time (ACUTE ONLY) 1351  Mobility Specialist Stop Time (ACUTE ONLY) 1402  Mobility Specialist Time Calculation (min) (ACUTE ONLY) 11 min   Pt received in bed and agreeable to mobility. Distance limited d/t coughing spells. No complaints during session. Pt to bed after session with all needs met.    Pre-mobility: 98% SpO2  Post-mobility: 95% SPO2  Chief Technology Officer

## 2024-01-04 NOTE — Plan of Care (Signed)

## 2024-01-05 ENCOUNTER — Ambulatory Visit: Admitting: Internal Medicine

## 2024-01-05 ENCOUNTER — Other Ambulatory Visit

## 2024-01-05 DIAGNOSIS — J441 Chronic obstructive pulmonary disease with (acute) exacerbation: Secondary | ICD-10-CM | POA: Diagnosis not present

## 2024-01-05 NOTE — Plan of Care (Signed)
  Problem: Clinical Measurements: Goal: Respiratory complications will improve Outcome: Progressing   Problem: Nutrition: Goal: Adequate nutrition will be maintained Outcome: Progressing   Problem: Safety: Goal: Ability to remain free from injury will improve Outcome: Progressing   Problem: Pain Managment: Goal: General experience of comfort will improve and/or be controlled Outcome: Progressing

## 2024-01-05 NOTE — Progress Notes (Signed)
 Pt on 2L of O2 at baseline. Pt receives home O2 through [employer]. Pt has portable oxygen  concentrator with her for transportation home at discharge.    01/05/24 1208  TOC Brief Assessment  Insurance and Status Reviewed  Patient has primary care physician Yes  Home environment has been reviewed Home w/ daughter  Prior level of function: Independent  Prior/Current Home Services No current home services  Social Drivers of Health Review SDOH reviewed no interventions necessary  Readmission risk has been reviewed Yes  Transition of care needs no transition of care needs at this time

## 2024-01-05 NOTE — Progress Notes (Signed)
 Mobility Specialist - Progress Note  ([place] 2L) Pre-mobility: 80 bpm HR, 95% SpO2 During mobility: 93 bpm HR, 91% SpO2 Post-mobility: 86 bpm HR92% SPO2   01/05/24 1431  Mobility  Activity Ambulated independently in hallway  Level of Assistance Modified independent, requires aide device or extra time  Assistive Device Other (Comment) (Hallway Rails)  Distance Ambulated (ft) 200 ft  Range of Motion/Exercises Active  Activity Response Tolerated well  Mobility Referral Yes  Mobility visit 1 Mobility  Mobility Specialist Start Time (ACUTE ONLY) 1421  Mobility Specialist Stop Time (ACUTE ONLY) 1431  Mobility Specialist Time Calculation (min) (ACUTE ONLY) 10 min   Pt was found in bed and agreeable to ambulate. Stated still feeling SOB. Returned to bed with all needs met and call bell in reach.  Lorna Rose Mobility Specialist

## 2024-01-05 NOTE — Progress Notes (Signed)
 PROGRESS NOTE  [place]acy Park AVW:[id number] DOB: Nov 16, 1955 DOA: 01/02/2024 PCP: Chana Comas, FNP   LOS: 2 days   Brief Narrative / Interim history: 67 year old female with history of COPD with chronic hypoxic respiratory failure with 2 L at home, PAF on Eliquis , HTN, HLD, hypothyroidism, stage III lung cancer status post left upper lobectomy, ongoing tobacco use comes into the hospital for shortness of breath.  She has been frequently admitted, most recently 4/20-4/22.  She tells me she was feeling better upon going home, lasted for about a day and a half and then experienced recurrent wheezing, shortness of breath and came back to the hospital.  Subjective / 24h Interval events: Complains of back pain,, her breathing is improved but not back to baseline  Assesement and Plan: Principal Problem:   COPD with acute exacerbation and acute on chronic respiratory failure with hypoxia Active Problems:   COPD exacerbation (HCC)   Chronic respiratory failure with hypoxia (HCC)   Paroxysmal atrial fibrillation (HCC)   Essential hypertension   Hypothyroidism   Hyperlipidemia   Anxiety and depression and bipolar   Tobacco use  Principal problem Acute COPD exacerbation-hospitalized for the same in the past.  Continue supportive care for now, nebulizers, steroids, 5 days of azithromycin  - Continue supplemental oxygen  - Still has wheezing, continue nebulizers, encourage ambulation  Active problems Tobacco use-smoking 3 cigarettes a day, she tells me she lit 1 cigarette up after her discharge but did not smoke it and put it out immediately.  Counseled for complete cessation.  Multiple family members are smoking in her household, but she tells me that they are smoking outside  Chronic hypoxic respiratory failure-she is stable on home oxygen , 2 L  PAF-continue Toprol , Eliquis   Hypothyroidism-continue Synthroid   Hyperlipidemia-continue statin  Depression/anxiety-continue Zoloft   Chronic  diastolic CHF-euvolemic, received Lasix  x 1 on 4/26  Scheduled Meds:  apixaban   5 mg Oral BID   arformoterol   15 mcg Nebulization BID   azithromycin   250 mg Oral QHS   budesonide  (PULMICORT ) nebulizer solution  0.25 mg Nebulization BID   gatifloxacin   4 drop Left Eye QID   guaiFENesin   600 mg Oral BID   levothyroxine   125 mcg Oral QAC breakfast   losartan   12.5 mg Oral Daily   methylPREDNISolone  (SOLU-MEDROL ) injection  80 mg Intravenous QHS   metoprolol  succinate  12.5 mg Oral Daily   polyvinyl alcohol   1 drop Both Eyes QID   prednisoLONE  acetate  1 drop Left Eye QID   rosuvastatin   20 mg Oral Daily   sertraline   25 mg Oral q AM   sodium chloride  flush  3 mL Intravenous Q12H   Continuous Infusions: PRN Meds:.acetaminophen  **OR** acetaminophen , ipratropium-albuterol , methocarbamol , ondansetron  **OR** ondansetron  (ZOFRAN ) IV, mouth rinse, oxyCODONE , polyethylene glycol  Current Outpatient Medications  Medication Instructions   acetaminophen  (TYLENOL ) 500 mg, Oral, Every 6 hours PRN   albuterol  (PROVENTIL ) (2.5 MG/3ML) 0.083% nebulizer solution Take 3 mLs (2.5 mg total) by nebulization every 4 (four) hours while awake for 3 days, THEN 3 mLs (2.5 mg total) every 4 (four) hours as needed for wheezing or shortness of breath.   albuterol  (PROVENTIL ) 2.5 mg, Nebulization, Every 4 hours PRN   albuterol  (VENTOLIN  HFA) 108 (90 Base) MCG/ACT inhaler 2 puffs, Inhalation, Every 6 hours PRN   benzonatate  (TESSALON ) 100 mg, Oral, 3 times daily PRN   Budeson-Glycopyrrol-Formoterol  (BREZTRI  AEROSPHERE) 160-9-4.8 MCG/ACT AERO 1 puff, Inhalation, 2 times daily   Eliquis  5 mg, Oral, 2 times daily  gatifloxacin  (ZYMAXID ) 0.5 % SOLN 1 drop, Left Eye, 4 times daily   levothyroxine  (SYNTHROID ) 125 mcg, Oral, Daily before breakfast   losartan  (COZAAR ) 12.5 mg, Oral, Daily   magnesium  oxide (MAG-OX) 400 mg, Oral, Every morning   metoprolol  succinate (TOPROL -XL) 12.5 mg, Oral, Daily   metoprolol  succinate  (TOPROL -XL) 12.5 mg, Oral, Daily   nitroGLYCERIN  (NITROSTAT ) 0.4 mg, Sublingual, Every 5 min PRN   oxyCODONE  (ROXICODONE ) 5 mg, Oral, Every 6 hours PRN   OXYGEN  2 L/min, Inhalation, Continuous   polyvinyl alcohol  (LIQUIFILM TEARS) 1.4 % ophthalmic solution 1 drop, Both Eyes, 4 times daily   prednisoLONE  acetate (PRED FORTE ) 1 % ophthalmic suspension 1 drop, Left Eye, 4 times daily   rosuvastatin  (CRESTOR ) 20 mg, Oral, Daily   sertraline  (ZOLOFT ) 25 mg, Oral, Every morning   SYSTANE COMPLETE PF 0.6 % SOLN 1 drop, Both Eyes, 4 times daily PRN    Diet Orders (From admission, onward)     Start     Ordered   01/02/24 1950  Diet Heart Room service appropriate? Yes; Fluid consistency: Thin  Diet effective now       Question Answer Comment  Room service appropriate? Yes   Fluid consistency: Thin      01/02/24 1950            DVT prophylaxis:  apixaban  (ELIQUIS ) tablet 5 mg   Lab Results  Component Value Date   PLT 232 01/03/2024      Code Status: Limited: Do not attempt resuscitation (DNR) -DNR-LIMITED -Do Not Intubate/DNI   Family Communication: No family at bedside  Status is: Inpatient   Level of care: Med-Surg  Consultants:  none  Objective: Vitals:   01/04/24 1937 01/04/24 2011 01/05/24 0442 01/05/24 0909  BP:  102/74 109/76   Pulse:  61 60   Resp:  18 18   Temp:  97.7 F (36.5 C) 97.7 F (36.5 C)   TempSrc:  Oral Oral   SpO2: 97% 99% 100% 95%  Weight:      Height:        Intake/Output Summary (Last 24 hours) at 01/05/2024 1019 Last data filed at 01/05/2024 1308 Gross per 24 hour  Intake 480 ml  Output --  Net 480 ml   Wt Readings from Last 3 Encounters:  01/02/24 46.5 kg  12/29/23 48.9 kg  12/13/23 44.1 kg    Examination:  Constitutional: NAD Eyes: lids and conjunctivae normal, no scleral icterus ENMT: mmm Neck: normal, supple Respiratory: Bibasal wheezing, no crackles Cardiovascular: Regular rate and rhythm, no murmurs / rubs / gallops.  No LE edema. Abdomen: soft, no distention, no tenderness. Bowel sounds positive.     Data Reviewed: I have independently reviewed following labs and imaging studies   CBC Recent Labs  Lab 12/30/23 0634 01/02/24 1300 01/03/24 0611  WBC 10.6* 11.9* 7.7  HGB 11.1* 11.6* 10.9*  HCT 35.4* 38.8 35.8*  PLT 211 218 232  MCV 95.7 96.8 95.5  MCH 30.0 28.9 29.1  MCHC 31.4 29.9* 30.4  RDW 13.7 14.2 14.0  LYMPHSABS  --  0.6*  --   MONOABS  --  0.3  --   EOSABS  --  0.0  --   BASOSABS  --  0.0  --     Recent Labs  Lab 12/30/23 0634 01/02/24 1300 01/03/24 0611 01/04/24 0807  NA 133* 140 141 140  K 4.0 3.6 4.4 3.7  CL 95* 98 99 99  CO2 30 32 30 31  GLUCOSE 175* 182* 136* 77  BUN 18 17 19  24*  CREATININE 0.61 0.50 0.51 0.71  CALCIUM  8.4* 9.1 8.7* 8.5*  AST  --  14*  --   --   ALT  --  17  --   --   ALKPHOS  --  52  --   --   BILITOT  --  0.2  --   --   ALBUMIN  --  3.4*  --   --   MG 2.0  --   --  2.0  BNP  --  124.9*  --   --     ------------------------------------------------------------------------------------------------------------------ No results for input(s): "CHOL", "HDL", "LDLCALC", "TRIG", "CHOLHDL", "LDLDIRECT" in the last 72 hours.  Lab Results  Component Value Date   HGBA1C 5.7 (H) 01/27/2023   ------------------------------------------------------------------------------------------------------------------ No results for input(s): "TSH", "T4TOTAL", "T3FREE", "THYROIDAB" in the last 72 hours.  Invalid input(s): "FREET3"  Cardiac Enzymes No results for input(s): "CKMB", "TROPONINI", "MYOGLOBIN" in the last 168 hours.  Invalid input(s): "CK" ------------------------------------------------------------------------------------------------------------------    Component Value Date/Time   BNP 124.9 (H) 01/02/2024 1300    CBG: No results for input(s): "GLUCAP" in the last 168 hours.  Recent Results (from the past 240 hours)  Resp panel by RT-PCR  (RSV, Flu A&B, Covid) Anterior Nasal Swab     Status: None   Collection Time: 12/28/23  5:28 PM   Specimen: Anterior Nasal Swab  Result Value Ref Range Status   SARS Coronavirus 2 by RT PCR NEGATIVE NEGATIVE Final    Comment: (NOTE) SARS-CoV-2 target nucleic acids are NOT DETECTED.  The SARS-CoV-2 RNA is generally detectable in upper respiratory specimens during the acute phase of infection. The lowest concentration of SARS-CoV-2 viral copies this assay can detect is 138 copies/mL. A negative result does not preclude SARS-Cov-2 infection and should not be used as the sole basis for treatment or other patient management decisions. A negative result may occur with  improper specimen collection/handling, submission of specimen other than nasopharyngeal swab, presence of viral mutation(s) within the areas targeted by this assay, and inadequate number of viral copies(<138 copies/mL). A negative result must be combined with clinical observations, patient history, and epidemiological information. The expected result is Negative.  Fact Sheet for Patients:  BloggerCourse.com  Fact Sheet for Healthcare Providers:  SeriousBroker.it  This test is no t yet approved or cleared by the [place]  FDA and  has been authorized for detection and/or diagnosis of SARS-CoV-2 by FDA under an Emergency Use Authorization (EUA). This EUA will remain  in effect (meaning this test can be used) for the duration of the COVID-19 declaration under Section 564(b)(1) of the Act, 21 U.S.C.section 360bbb-3(b)(1), unless the authorization is terminated  or revoked sooner.       Influenza A by PCR NEGATIVE NEGATIVE Final   Influenza B by PCR NEGATIVE NEGATIVE Final    Comment: (NOTE) The Xpert Xpress SARS-CoV-2/FLU/RSV plus assay is intended as an aid in the diagnosis of influenza from Nasopharyngeal swab specimens and should not be used as a sole basis for  treatment. Nasal washings and aspirates are unacceptable for Xpert Xpress SARS-CoV-2/FLU/RSV testing.  Fact Sheet for Patients: BloggerCourse.com  Fact Sheet for Healthcare Providers: SeriousBroker.it  This test is not yet approved or cleared by the [place]  FDA and has been authorized for detection and/or diagnosis of SARS-CoV-2 by FDA under an Emergency Use Authorization (EUA). This EUA will remain in effect (meaning this test can be used) for the duration  of the COVID-19 declaration under Section 564(b)(1) of the Act, 21 U.S.C. section 360bbb-3(b)(1), unless the authorization is terminated or revoked.     Resp Syncytial Virus by PCR NEGATIVE NEGATIVE Final    Comment: (NOTE) Fact Sheet for Patients: BloggerCourse.com  Fact Sheet for Healthcare Providers: SeriousBroker.it  This test is not yet approved or cleared by the [place]  FDA and has been authorized for detection and/or diagnosis of SARS-CoV-2 by FDA under an Emergency Use Authorization (EUA). This EUA will remain in effect (meaning this test can be used) for the duration of the COVID-19 declaration under Section 564(b)(1) of the Act, 21 U.S.C. section 360bbb-3(b)(1), unless the authorization is terminated or revoked.  Performed at [facility], [address], [place], [place] [address]   Resp panel by RT-PCR (RSV, Flu A&B, Covid) Anterior Nasal Swab     Status: None   Collection Time: 01/02/24  1:41 PM   Specimen: Anterior Nasal Swab  Result Value Ref Range Status   SARS Coronavirus 2 by RT PCR NEGATIVE NEGATIVE Final    Comment: (NOTE) SARS-CoV-2 target nucleic acids are NOT DETECTED.  The SARS-CoV-2 RNA is generally detectable in upper respiratory specimens during the acute phase of infection. The lowest concentration of SARS-CoV-2 viral copies this assay can detect is 138  copies/mL. A negative result does not preclude SARS-Cov-2 infection and should not be used as the sole basis for treatment or other patient management decisions. A negative result may occur with  improper specimen collection/handling, submission of specimen other than nasopharyngeal swab, presence of viral mutation(s) within the areas targeted by this assay, and inadequate number of viral copies(<138 copies/mL). A negative result must be combined with clinical observations, patient history, and epidemiological information. The expected result is Negative.  Fact Sheet for Patients:  BloggerCourse.com  Fact Sheet for Healthcare Providers:  SeriousBroker.it  This test is no t yet approved or cleared by the [place]  FDA and  has been authorized for detection and/or diagnosis of SARS-CoV-2 by FDA under an Emergency Use Authorization (EUA). This EUA will remain  in effect (meaning this test can be used) for the duration of the COVID-19 declaration under Section 564(b)(1) of the Act, 21 U.S.C.section 360bbb-3(b)(1), unless the authorization is terminated  or revoked sooner.       Influenza A by PCR NEGATIVE NEGATIVE Final   Influenza B by PCR NEGATIVE NEGATIVE Final    Comment: (NOTE) The Xpert Xpress SARS-CoV-2/FLU/RSV plus assay is intended as an aid in the diagnosis of influenza from Nasopharyngeal swab specimens and should not be used as a sole basis for treatment. Nasal washings and aspirates are unacceptable for Xpert Xpress SARS-CoV-2/FLU/RSV testing.  Fact Sheet for Patients: BloggerCourse.com  Fact Sheet for Healthcare Providers: SeriousBroker.it  This test is not yet approved or cleared by the [place]  FDA and has been authorized for detection and/or diagnosis of SARS-CoV-2 by FDA under an Emergency Use Authorization (EUA). This EUA will remain in effect (meaning  this test can be used) for the duration of the COVID-19 declaration under Section 564(b)(1) of the Act, 21 U.S.C. section 360bbb-3(b)(1), unless the authorization is terminated or revoked.     Resp Syncytial Virus by PCR NEGATIVE NEGATIVE Final    Comment: (NOTE) Fact Sheet for Patients: BloggerCourse.com  Fact Sheet for Healthcare Providers: SeriousBroker.it  This test is not yet approved or cleared by the [place]  FDA and has been authorized for detection and/or diagnosis of SARS-CoV-2 by FDA under an Emergency  Use Authorization (EUA). This EUA will remain in effect (meaning this test can be used) for the duration of the COVID-19 declaration under Section 564(b)(1) of the Act, 21 U.S.C. section 360bbb-3(b)(1), unless the authorization is terminated or revoked.  Performed at [facility], [address], [place], [place] [address]      Radiology Studies: No results found.    Kathlen Para, MD, PhD [facility]  Between 7 am - 7 pm I am available, please contact me via Amion (for emergencies) or Securechat (non urgent messages)  Between 7 pm - 7 am I am not available, please contact night coverage MD/APP via Amion

## 2024-01-05 NOTE — Plan of Care (Signed)

## 2024-01-06 DIAGNOSIS — J441 Chronic obstructive pulmonary disease with (acute) exacerbation: Secondary | ICD-10-CM | POA: Diagnosis not present

## 2024-01-06 LAB — MAGNESIUM: Magnesium: 1.9 mg/dL (ref 1.7–2.4)

## 2024-01-06 LAB — BASIC METABOLIC PANEL WITH GFR
Anion gap: 9 (ref 5–15)
BUN: 28 mg/dL — ABNORMAL HIGH (ref 8–23)
CO2: 30 mmol/L (ref 22–32)
Calcium: 8.6 mg/dL — ABNORMAL LOW (ref 8.9–10.3)
Chloride: 98 mmol/L (ref 98–111)
Creatinine, Ser: 0.67 mg/dL (ref 0.44–1.00)
GFR, Estimated: >60 mL/min (ref >60)
Glucose, Bld: 154 mg/dL — ABNORMAL HIGH (ref 70–99)
Potassium: 4.2 mmol/L (ref 3.5–5.1)
Sodium: 137 mmol/L (ref 135–145)

## 2024-01-06 MED ORDER — SODIUM CHLORIDE 3 % IN NEBU
4.0000 mL | INHALATION_SOLUTION | Freq: Two times a day (BID) | RESPIRATORY_TRACT | Status: AC
  Administered 2024-01-06 (×2): 4 mL via RESPIRATORY_TRACT
  Filled 2024-01-06 (×2): qty 4

## 2024-01-06 NOTE — Progress Notes (Signed)
 PROGRESS NOTE  KNOWLEDGE LEADERS VWU:[id number] DOB: [birth year] DOA: 01/02/2024 PCP: Chana Comas, FNP   LOS: 3 days   Brief Narrative / Interim history: 68 year old female with history of COPD with chronic hypoxic respiratory failure with 2 L at home, PAF on Eliquis , HTN, HLD, hypothyroidism, stage III lung cancer status post left upper lobectomy, ongoing tobacco use comes into the hospital for shortness of breath.  She has been frequently admitted, most recently 4/20-4/22.  She tells me she was feeling better upon going home, lasted for about a day and a half and then experienced recurrent wheezing, shortness of breath and came back to the hospital.  Subjective / 24h Interval events: She is not feeling a whole lot better.  Complains of very deep coughing with chest discomfort, not bringing up much sputum.  Still has a lot of wheezing.  She was able to ambulate some yesterday with moderate shortness of breath  Assesement and Plan: Principal Problem:   COPD with acute exacerbation and acute on chronic respiratory failure with hypoxia Active Problems:   COPD exacerbation (HCC)   Chronic respiratory failure with hypoxia (HCC)   Paroxysmal atrial fibrillation (HCC)   Essential hypertension   Hypothyroidism   Hyperlipidemia   Anxiety and depression and bipolar   Tobacco use  Principal problem Acute COPD exacerbation - hospitalized for the same in the past, recently discharged home but only lasted a few days before coming back.  Continue supportive care for now, nebulizers, steroids, 5 days of azithromycin  - Continue supplemental oxygen  - Still has wheezing, continue nebulizers, encourage ambulation - She sounds more congested this morning but unable to bring up sputum, will do a trial of hypertonic saline for 1 day and see if that improves her coughing  Active problems Tobacco use-smoking 3 cigarettes a day, she tells me she lit 1 cigarette up after her discharge but did not smoke it and  put it out immediately.  Counseled for complete cessation.  Multiple family members are smoking in her household, but she tells me that they are smoking outside  Chronic hypoxic respiratory failure-she is stable on home oxygen , 2 L  PAF-continue Toprol , Eliquis   Hypothyroidism-continue Synthroid   Hyperlipidemia-continue statin  Depression/anxiety-continue Zoloft   Chronic diastolic CHF-euvolemic, received Lasix  x 1 on 4/26  Scheduled Meds:  apixaban   5 mg Oral BID   arformoterol   15 mcg Nebulization BID   azithromycin   250 mg Oral QHS   budesonide  (PULMICORT ) nebulizer solution  0.25 mg Nebulization BID   gatifloxacin   4 drop Left Eye QID   guaiFENesin   600 mg Oral BID   levothyroxine   125 mcg Oral QAC breakfast   losartan   12.5 mg Oral Daily   methylPREDNISolone  (SOLU-MEDROL ) injection  80 mg Intravenous QHS   metoprolol  succinate  12.5 mg Oral Daily   polyvinyl alcohol   1 drop Both Eyes QID   prednisoLONE  acetate  1 drop Left Eye QID   rosuvastatin   20 mg Oral Daily   sertraline   25 mg Oral q AM   sodium chloride  flush  3 mL Intravenous Q12H   sodium chloride  HYPERTONIC  4 mL Nebulization BID   Continuous Infusions: PRN Meds:.acetaminophen  **OR** acetaminophen , ipratropium-albuterol , methocarbamol , ondansetron  **OR** ondansetron  (ZOFRAN ) IV, mouth rinse, oxyCODONE , polyethylene glycol  Current Outpatient Medications  Medication Instructions   acetaminophen  (TYLENOL ) 500 mg, Oral, Every 6 hours PRN   albuterol  (PROVENTIL ) (2.5 MG/3ML) 0.083% nebulizer solution Take 3 mLs (2.5 mg total) by nebulization every 4 (four) hours while awake for 3  days, THEN 3 mLs (2.5 mg total) every 4 (four) hours as needed for wheezing or shortness of breath.   albuterol  (PROVENTIL ) 2.5 mg, Nebulization, Every 4 hours PRN   albuterol  (VENTOLIN  HFA) 108 (90 Base) MCG/ACT inhaler 2 puffs, Inhalation, Every 6 hours PRN   benzonatate  (TESSALON ) 100 mg, Oral, 3 times daily PRN    Budeson-Glycopyrrol-Formoterol  (BREZTRI  AEROSPHERE) 160-9-4.8 MCG/ACT AERO 1 puff, Inhalation, 2 times daily   Eliquis  5 mg, Oral, 2 times daily   gatifloxacin  (ZYMAXID ) 0.5 % SOLN 1 drop, Left Eye, 4 times daily   levothyroxine  (SYNTHROID ) 125 mcg, Oral, Daily before breakfast   losartan  (COZAAR ) 12.5 mg, Oral, Daily   magnesium  oxide (MAG-OX) 400 mg, Oral, Every morning   metoprolol  succinate (TOPROL -XL) 12.5 mg, Oral, Daily   metoprolol  succinate (TOPROL -XL) 12.5 mg, Oral, Daily   nitroGLYCERIN  (NITROSTAT ) 0.4 mg, Sublingual, Every 5 min PRN   oxyCODONE  (ROXICODONE ) 5 mg, Oral, Every 6 hours PRN   OXYGEN  2 L/min, Inhalation, Continuous   polyvinyl alcohol  (LIQUIFILM TEARS) 1.4 % ophthalmic solution 1 drop, Both Eyes, 4 times daily   prednisoLONE  acetate (PRED FORTE ) 1 % ophthalmic suspension 1 drop, Left Eye, 4 times daily   rosuvastatin  (CRESTOR ) 20 mg, Oral, Daily   sertraline  (ZOLOFT ) 25 mg, Oral, Every morning   SYSTANE COMPLETE PF 0.6 % SOLN 1 drop, Both Eyes, 4 times daily PRN    Diet Orders (From admission, onward)     Start     Ordered   01/02/24 1950  Diet Heart Room service appropriate? Yes; Fluid consistency: Thin  Diet effective now       Question Answer Comment  Room service appropriate? Yes   Fluid consistency: Thin      01/02/24 1950            DVT prophylaxis:  apixaban  (ELIQUIS ) tablet 5 mg   Lab Results  Component Value Date   PLT 232 01/03/2024      Code Status: Limited: Do not attempt resuscitation (DNR) -DNR-LIMITED -Do Not Intubate/DNI   Family Communication: No family at bedside  Status is: Inpatient   Level of care: Med-Surg  Consultants:  none  Objective: Vitals:   01/05/24 1936 01/05/24 2100 01/06/24 0459 01/06/24 0842  BP:  101/64 100/67   Pulse:  76 69   Resp:  18 18   Temp:  98.1 F (36.7 C) 97.9 F (36.6 C)   TempSrc:  Oral Oral   SpO2: 98% 99% 100% 99%  Weight:      Height:       No intake or output data in the 24  hours ending 01/06/24 1037  Wt Readings from Last 3 Encounters:  01/02/24 46.5 kg  12/29/23 48.9 kg  12/13/23 44.1 kg    Examination:  Constitutional: NAD Eyes: lids and conjunctivae normal, no scleral icterus ENMT: mmm Neck: normal, supple Respiratory: Coarse breath sounds bilaterally, bilateral wheezing. Cardiovascular: Regular rate and rhythm, no murmurs / rubs / gallops. No LE edema. Abdomen: soft, no distention, no tenderness. Bowel sounds positive.    Data Reviewed: I have independently reviewed following labs and imaging studies   CBC Recent Labs  Lab 01/02/24 1300 01/03/24 0611  WBC 11.9* 7.7  HGB 11.6* 10.9*  HCT 38.8 35.8*  PLT 218 232  MCV 96.8 95.5  MCH 28.9 29.1  MCHC 29.9* 30.4  RDW 14.2 14.0  LYMPHSABS 0.6*  --   MONOABS 0.3  --   EOSABS 0.0  --   BASOSABS 0.0  --  Recent Labs  Lab 01/02/24 1300 01/03/24 0611 01/04/24 0807 01/06/24 0534  NA 140 141 140 137  K 3.6 4.4 3.7 4.2  CL 98 99 99 98  CO2 32 30 31 30   GLUCOSE 182* 136* 77 154*  BUN 17 19 24* 28*  CREATININE 0.50 0.51 0.71 0.67  CALCIUM  9.1 8.7* 8.5* 8.6*  AST 14*  --   --   --   ALT 17  --   --   --   ALKPHOS 52  --   --   --   BILITOT 0.2  --   --   --   ALBUMIN 3.4*  --   --   --   MG  --   --  2.0 1.9  BNP 124.9*  --   --   --     ------------------------------------------------------------------------------------------------------------------ No results for input(s): "CHOL", "HDL", "LDLCALC", "TRIG", "CHOLHDL", "LDLDIRECT" in the last 72 hours.  Lab Results  Component Value Date   HGBA1C 5.7 (H) 01/27/2023   ------------------------------------------------------------------------------------------------------------------ No results for input(s): "TSH", "T4TOTAL", "T3FREE", "THYROIDAB" in the last 72 hours.  Invalid input(s): "FREET3"  Cardiac Enzymes No results for input(s): "CKMB", "TROPONINI", "MYOGLOBIN" in the last 168 hours.  Invalid input(s):  "CK" ------------------------------------------------------------------------------------------------------------------    Component Value Date/Time   BNP 124.9 (H) 01/02/2024 1300    CBG: No results for input(s): "GLUCAP" in the last 168 hours.  Recent Results (from the past 240 hours)  Resp panel by RT-PCR (RSV, Flu A&B, Covid) Anterior Nasal Swab     Status: None   Collection Time: 12/28/23  5:28 PM   Specimen: Anterior Nasal Swab  Result Value Ref Range Status   SARS Coronavirus 2 by RT PCR NEGATIVE NEGATIVE Final    Comment: (NOTE) SARS-CoV-2 target nucleic acids are NOT DETECTED.  The SARS-CoV-2 RNA is generally detectable in upper respiratory specimens during the acute phase of infection. The lowest concentration of SARS-CoV-2 viral copies this assay can detect is 138 copies/mL. A negative result does not preclude SARS-Cov-2 infection and should not be used as the sole basis for treatment or other patient management decisions. A negative result may occur with  improper specimen collection/handling, submission of specimen other than nasopharyngeal swab, presence of viral mutation(s) within the areas targeted by this assay, and inadequate number of viral copies(<138 copies/mL). A negative result must be combined with clinical observations, patient history, and epidemiological information. The expected result is Negative.  Fact Sheet for Patients:  BloggerCourse.com  Fact Sheet for Healthcare Providers:  SeriousBroker.it  This test is no t yet approved or cleared by the [place]  FDA and  has been authorized for detection and/or diagnosis of SARS-CoV-2 by FDA under an Emergency Use Authorization (EUA). This EUA will remain  in effect (meaning this test can be used) for the duration of the COVID-19 declaration under Section 564(b)(1) of the Act, 21 U.S.C.section 360bbb-3(b)(1), unless the authorization is terminated   or revoked sooner.       Influenza A by PCR NEGATIVE NEGATIVE Final   Influenza B by PCR NEGATIVE NEGATIVE Final    Comment: (NOTE) The Xpert Xpress SARS-CoV-2/FLU/RSV plus assay is intended as an aid in the diagnosis of influenza from Nasopharyngeal swab specimens and should not be used as a sole basis for treatment. Nasal washings and aspirates are unacceptable for Xpert Xpress SARS-CoV-2/FLU/RSV testing.  Fact Sheet for Patients: BloggerCourse.com  Fact Sheet for Healthcare Providers: SeriousBroker.it  This test is not yet approved or cleared by  the [place]  FDA and has been authorized for detection and/or diagnosis of SARS-CoV-2 by FDA under an Emergency Use Authorization (EUA). This EUA will remain in effect (meaning this test can be used) for the duration of the COVID-19 declaration under Section 564(b)(1) of the Act, 21 U.S.C. section 360bbb-3(b)(1), unless the authorization is terminated or revoked.     Resp Syncytial Virus by PCR NEGATIVE NEGATIVE Final    Comment: (NOTE) Fact Sheet for Patients: BloggerCourse.com  Fact Sheet for Healthcare Providers: SeriousBroker.it  This test is not yet approved or cleared by the [place]  FDA and has been authorized for detection and/or diagnosis of SARS-CoV-2 by FDA under an Emergency Use Authorization (EUA). This EUA will remain in effect (meaning this test can be used) for the duration of the COVID-19 declaration under Section 564(b)(1) of the Act, 21 U.S.C. section 360bbb-3(b)(1), unless the authorization is terminated or revoked.  Performed at [facility], [address] [place], [place] [address]   Resp panel by RT-PCR (RSV, Flu A&B, Covid) Anterior Nasal Swab     Status: None   Collection Time: 01/02/24  1:41 PM   Specimen: Anterior Nasal Swab  Result Value Ref Range Status   SARS  Coronavirus 2 by RT PCR NEGATIVE NEGATIVE Final    Comment: (NOTE) SARS-CoV-2 target nucleic acids are NOT DETECTED.  The SARS-CoV-2 RNA is generally detectable in upper respiratory specimens during the acute phase of infection. The lowest concentration of SARS-CoV-2 viral copies this assay can detect is 138 copies/mL. A negative result does not preclude SARS-Cov-2 infection and should not be used as the sole basis for treatment or other patient management decisions. A negative result may occur with  improper specimen collection/handling, submission of specimen other than nasopharyngeal swab, presence of viral mutation(s) within the areas targeted by this assay, and inadequate number of viral copies(<138 copies/mL). A negative result must be combined with clinical observations, patient history, and epidemiological information. The expected result is Negative.  Fact Sheet for Patients:  BloggerCourse.com  Fact Sheet for Healthcare Providers:  SeriousBroker.it  This test is no t yet approved or cleared by the [place]  FDA and  has been authorized for detection and/or diagnosis of SARS-CoV-2 by FDA under an Emergency Use Authorization (EUA). This EUA will remain  in effect (meaning this test can be used) for the duration of the COVID-19 declaration under Section 564(b)(1) of the Act, 21 U.S.C.section 360bbb-3(b)(1), unless the authorization is terminated  or revoked sooner.       Influenza A by PCR NEGATIVE NEGATIVE Final   Influenza B by PCR NEGATIVE NEGATIVE Final    Comment: (NOTE) The Xpert Xpress SARS-CoV-2/FLU/RSV plus assay is intended as an aid in the diagnosis of influenza from Nasopharyngeal swab specimens and should not be used as a sole basis for treatment. Nasal washings and aspirates are unacceptable for Xpert Xpress SARS-CoV-2/FLU/RSV testing.  Fact Sheet for  Patients: BloggerCourse.com  Fact Sheet for Healthcare Providers: SeriousBroker.it  This test is not yet approved or cleared by the [place]  FDA and has been authorized for detection and/or diagnosis of SARS-CoV-2 by FDA under an Emergency Use Authorization (EUA). This EUA will remain in effect (meaning this test can be used) for the duration of the COVID-19 declaration under Section 564(b)(1) of the Act, 21 U.S.C. section 360bbb-3(b)(1), unless the authorization is terminated or revoked.     Resp Syncytial Virus by PCR NEGATIVE NEGATIVE Final    Comment: (NOTE) Fact Sheet for Patients:  BloggerCourse.com  Fact Sheet for Healthcare Providers: SeriousBroker.it  This test is not yet approved or cleared by the [place]  FDA and has been authorized for detection and/or diagnosis of SARS-CoV-2 by FDA under an Emergency Use Authorization (EUA). This EUA will remain in effect (meaning this test can be used) for the duration of the COVID-19 declaration under Section 564(b)(1) of the Act, 21 U.S.C. section 360bbb-3(b)(1), unless the authorization is terminated or revoked.  Performed at [facility], [address] [place], [place] [address]      Radiology Studies: No results found.    Kathlen Para, MD, PhD [facility]  Between 7 am - 7 pm I am available, please contact me via Amion (for emergencies) or Securechat (non urgent messages)  Between 7 pm - 7 am I am not available, please contact night coverage MD/APP via Amion

## 2024-01-06 NOTE — Progress Notes (Signed)
 Mobility Specialist - Progress Note   01/06/24 1018  Oxygen  Therapy  O2 Device Nasal Cannula  O2 Flow Rate (L/min) 2 L/min  Mobility  Activity Ambulated independently in hallway  Level of Assistance Independent  Assistive Device None  Distance Ambulated (ft) 500 ft  Activity Response Tolerated well  Mobility Referral Yes  Mobility visit 1 Mobility  Mobility Specialist Start Time (ACUTE ONLY) [id number]  Mobility Specialist Stop Time (ACUTE ONLY) 1017  Mobility Specialist Time Calculation (min) (ACUTE ONLY) 19 min   Pt received in bed and agreeable to mobility. No complaints during session. Pt to bed after session with all needs met.    Pre-mobility: 97% SpO2 (2L [place]) Post-mobility: 92% SPO2 (2L )  Maya Enisa Runyan Mobility Specialist

## 2024-01-06 NOTE — Plan of Care (Signed)

## 2024-01-07 DIAGNOSIS — J441 Chronic obstructive pulmonary disease with (acute) exacerbation: Secondary | ICD-10-CM | POA: Diagnosis not present

## 2024-01-07 NOTE — Discharge Summary (Signed)
 Physician Discharge Summary   Patient: Tracy Park MRN: [id number] DOB: 10-14-1955  Admit date:     01/02/2024  Discharge date: 01/07/24  Discharge Physician: Ephriam Hashimoto   PCP: Chana Comas, FNP     Recommendations at discharge:  Follow up for PET CT on 5/21 Follow up for Pulmonology on 5/27 Follow up for Oncology after PET CT     Discharge Diagnoses: Principal Problem:   COPD with acute exacerbation and acute on chronic respiratory failure with hypoxia Active Problems:   Chronic respiratory failure with hypoxia (HCC)   RUL lung nodule   Chronic chest discomfort   Paroxysmal atrial fibrillation (HCC)   Essential hypertension   Hypothyroidism   Hyperlipidemia   Anxiety and depression and bipolar   Tobacco use      Hospital Course: [name] with COPD and chronic hypoxic respiratory failure on 2 L O2 via [place] at baseline, PAF on Eliquis , lung cancer s/p left upper lobectomy, HTN, HLD, hypothyroidism, depression who presented with SOB, chest discomfort, found to have wheezing and COPD flare.   COPD exacerbation Admitted started on steroids, bronchodilators.  Treated with 5 days azithromycin , symptoms improved to her recent baseline.  Discharged on home ICS/LABA/LAMA.    Lung nodule Noted in March CT chest, subpleural consolidation in RUL.  Seen Pulmonology and missed Oncology appoitnments.   Differential includes infectious consolidation, fibrosis, scarring, inflammation, recurrent cancer.  Discussed the differential with the patient.  I sat with the patient and scheduled her PET CT which was still pending.  This is coming up now in 3 weeks.  She also has a pulmonology appointment after.  Discussed possibility of biopsy pending PET results.  She knows to call Dr. Bing Buff office for a follow up after the PET as well.   Chronic chest discomfort This appears to be largely driven by chronic lung disease and probably anxiety about unresolved evaluation of  her RUL lung nodule.  Chronic respiratory failure Smoking Smoking cessation recommended  Paroxysmal atrial fibrillation Stable on Toprol  and Eliquis   Anxiety/depression Stable on Zoloft   Hyperlipidemia  Chronic diastolic congestive heart failure Appeared euvolemic.         The [place]  Controlled Substances Registry was reviewed for this patient prior to discharge.  Consultants: None   Disposition: Home Diet recommendation: Regular   DISCHARGE MEDICATION: Allergies as of 01/07/2024       Reactions   Red Dye #40 (allura Red) Hives, Itching, Other (See Comments)   Red food dye   Strawberry Extract Hives, Itching   Tomato Hives, Itching   Aspirin Hives   Tape Rash, Other (See Comments)   Prefers paper tape   Wound Dressing Adhesive Rash        Medication List     STOP taking these medications    predniSONE  20 MG tablet Commonly known as: DELTASONE        TAKE these medications    acetaminophen  500 MG tablet Commonly known as: TYLENOL  Take 500 mg by mouth every 6 (six) hours as needed for moderate pain (pain score 4-6).   albuterol  (2.5 MG/3ML) 0.083% nebulizer solution Commonly known as: PROVENTIL  Take 2.5 mg by nebulization every 4 (four) hours as needed for wheezing or shortness of breath. What changed: Another medication with the same name was changed. Make sure you understand how and when to take each.   albuterol  (2.5 MG/3ML) 0.083% nebulizer solution Commonly known as: PROVENTIL  Take 3 mLs (2.5 mg total) by nebulization every 4 (four)  hours while awake for 3 days, THEN 3 mLs (2.5 mg total) every 4 (four) hours as needed for wheezing or shortness of breath. Start taking on: October 18, 2023 What changed: See the new instructions.   albuterol  108 (90 Base) MCG/ACT inhaler Commonly known as: VENTOLIN  HFA Inhale 2 puffs into the lungs every 6 (six) hours as needed for wheezing or shortness of breath. What changed: Another medication  with the same name was changed. Make sure you understand how and when to take each.   benzonatate  100 MG capsule Commonly known as: TESSALON  Take 1 capsule (100 mg total) by mouth 3 (three) times daily as needed for cough.   Breztri  Aerosphere 160-9-4.8 MCG/ACT Aero inhaler Generic drug: budeson-glycopyrrolate -formoterol  Inhale 1 puff into the lungs in the morning and at bedtime.   Eliquis  5 MG Tabs tablet Generic drug: apixaban  Take 1 tablet (5 mg total) by mouth 2 (two) times daily.   gatifloxacin  0.5 % Soln Commonly known as: ZYMAXID  Place 1 drop into the left eye 4 (four) times daily.   levothyroxine  125 MCG tablet Commonly known as: SYNTHROID  Take 125 mcg by mouth daily before breakfast.   losartan  25 MG tablet Commonly known as: COZAAR  Take 12.5 mg by mouth daily.   magnesium  oxide 400 (240 Mg) MG tablet Commonly known as: MAG-OX Take 400 mg by mouth in the morning.   metoprolol  succinate 25 MG 24 hr tablet Commonly known as: TOPROL -XL Take 12.5 mg by mouth daily.   metoprolol  succinate 25 MG 24 hr tablet Commonly known as: TOPROL -XL Take 0.5 tablets (12.5 mg total) by mouth daily.   nitroGLYCERIN  0.4 MG SL tablet Commonly known as: NITROSTAT  Place 0.4 mg under the tongue every 5 (five) minutes as needed for chest pain.   oxyCODONE  5 MG immediate release tablet Commonly known as: Roxicodone  Take 1 tablet (5 mg total) by mouth every 6 (six) hours as needed for severe pain (pain score 7-10).   OXYGEN  Inhale 2 L/min into the lungs continuous.   polyvinyl alcohol  1.4 % ophthalmic solution Commonly known as: LIQUIFILM TEARS Place 1 drop into both eyes in the morning, at noon, in the evening, and at bedtime.   prednisoLONE  acetate 1 % ophthalmic suspension Commonly known as: PRED FORTE  Place 1 drop into the left eye 4 (four) times daily.   rosuvastatin  20 MG tablet Commonly known as: CRESTOR  Take 20 mg by mouth daily.   sertraline  25 MG tablet Commonly  known as: ZOLOFT  Take 25 mg by mouth in the morning.   Systane Complete PF 0.6 % Soln Generic drug: Propylene Glycol (PF) Place 1 drop into both eyes 4 (four) times daily as needed.        Follow-up Information     Chana Comas, FNP. Schedule an appointment as soon as possible for a visit in 1 week(s).   Specialty: Family Medicine Contact information: [address] [place] [address] [phone]         Sandie Cross. Schedule an appointment as soon as possible for a visit.   Why: [phone] Contact information: [facility]        Marlene Simas, MD Follow up.   Specialty: Oncology Contact information: [address] [place] [address] [phone]                 Discharge Instructions     Discharge instructions   Complete by: As directed    **IMPORTANT DISCHARGE INSTRUCTIONS**   From Dr. Darlyn Eke: You were  admitted for wheezing.  Here, you were treated with steroids and symptoms are stable RESUME ALL YOUR HOME INHALERS  Take a cough medicine (like Tessalon  perles or like over the counter dextromethorphan ) as needed for cough  IMPORTANT: you need follow up for this spot on your lung The spot (or "consolidation" or "tumor" or "nodule") is unclear at this time It may be just infection, scarring, or inflammation But the concerning possibility is that it is cancer.  go to your upcoming PET scan This is a special imaging test to look for "active cells" If this shows "active cells" in the spot, then it will need Biopsy  After you complete the PET scan, schedule a follow up appointment as soon as you can with Sandie Cross at [facility] and with Dr. Marguerita Shih at [facility]   Increase activity slowly   Complete by: As directed        Discharge Exam: Filed Weights   01/02/24 1931  Weight: 46.5 kg    General: Pt is alert, awake, not in acute distress Cardiovascular: RRR, nl S1-S2, no  murmurs appreciated.   No LE edema.   Respiratory: Normal respiratory rate and rhythm.  Wheezing bilaterally Abdominal: Abdomen soft and non-tender.  No distension or HSM.   Neuro/Psych: Strength symmetric in upper and lower extremities.  Judgment and insight appear normal.   Condition at discharge: fair  The results of significant diagnostics from this hospitalization (including imaging, microbiology, ancillary and laboratory) are listed below for reference.   Imaging Studies: DG Chest Portable 1 View Result Date: 01/02/2024 CLINICAL DATA:  Shortness of breath.  History of lung cancer. EXAM: PORTABLE CHEST 1 VIEW COMPARISON:  Chest radiograph dated 12/28/2023. FINDINGS: Stable cardiomediastinal silhouette. Prior left upper lobectomy with chronic scarring and left sided volume loss. Emphysema with bullous changes again noted in the superior segment of the left lower lobe. No focal consolidation, sizeable pleural effusion, or pneumothorax. No acute osseous abnormality. IMPRESSION: 1. No acute cardiopulmonary findings. 2. Emphysema. 3. Prior left upper lobectomy with chronic scarring and left sided volume loss. Electronically Signed   By: Mannie Seek M.D.   On: 01/02/2024 13:57   DG Chest 2 View Result Date: 12/28/2023 CLINICAL DATA:  Shortness of breath for 1 day. History of lung cancer. EXAM: CHEST - 2 VIEW COMPARISON:  Radiograph 12/12/2023 and CTA chest 12/13/2023 FINDINGS: Chronic interstitial coarsening and hyperinflation of the right lung. Chronic scarring/volume loss of the left upper lung with leftward deviation of the trachea. No focal consolidation. Chronic blunting of the left costophrenic angle. No pneumothorax. Stable cardiomediastinal silhouette. No displaced rib fractures. IMPRESSION: Emphysema.  Chronic volume loss/scarring in the left upper lung. Electronically Signed   By: Rozell Cornet M.D.   On: 12/28/2023 17:28   CT Angio Chest PE W/Cm &/Or Wo Cm Result Date:  12/13/2023 CLINICAL DATA:  Suspected pulmonary embolism. EXAM: CT ANGIOGRAPHY CHEST WITH CONTRAST TECHNIQUE: Multidetector CT imaging of the chest was performed using the standard protocol during bolus administration of intravenous contrast. Multiplanar CT image reconstructions and MIPs were obtained to evaluate the vascular anatomy. RADIATION DOSE REDUCTION: This exam was performed according to the departmental dose-optimization program which includes automated exposure control, adjustment of the mA and/or kV according to patient size and/or use of iterative reconstruction technique. CONTRAST:  80mL OMNIPAQUE  IOHEXOL  350 MG/ML SOLN COMPARISON:  November 25, 2023 FINDINGS: Cardiovascular: There is marked severity calcification of the aortic arch, without evidence of aortic aneurysm. Satisfactory opacification of the  pulmonary arteries to the segmental level. No evidence of pulmonary embolism. Normal heart size with mild coronary artery calcification. No pericardial effusion. Mediastinum/Nodes: No enlarged mediastinal, hilar, or axillary lymph nodes. Thyroid  gland, trachea, and esophagus demonstrate no significant findings. Lungs/Pleura: There is evidence of prior left upper lobectomy with subsequent left-sided volume loss. Emphysematous lung disease is also seen with bullous changes and chronic scarring seen within the superior segment of the left lower lobe. Mild, stable subpleural consolidation is seen along the posterior aspect of the right upper lobe. Mild posterior left lower lobe and posterior right basilar linear atelectasis is seen. No pleural effusion or pneumothorax is identified. Upper Abdomen: No acute abnormality. Musculoskeletal: Postoperative changes are seen within the visualized portion of the lower cervical spine. No acute osseous abnormalities are identified. Review of the MIP images confirms the above findings. IMPRESSION: 1. No evidence of pulmonary embolism. 2. Evidence of prior left upper  lobectomy with subsequent left-sided volume loss. 3. Emphysematous lung disease with bullous changes and chronic scarring within the superior segment of the left lower lobe. 4. Mild, stable subpleural consolidation along the posterior aspect of the right upper lobe. 5. Mild posterior left lower lobe and posterior right basilar linear atelectasis. 6. Aortic atherosclerosis. Aortic Atherosclerosis (ICD10-I70.0) and Emphysema (ICD10-J43.9). Electronically Signed   By: Virgle Grime M.D.   On: 12/13/2023 03:19   DG Chest 2 View Result Date: 12/12/2023 CLINICAL DATA:  Chest pain EXAM: CHEST - 2 VIEW COMPARISON:  12/09/2023, 08/06/2023 FINDINGS: Right lung is grossly clear. Chronic volume loss and postoperative change in the left thorax prick no acute airspace disease, pleural effusion or pneumothorax. Emphysema. IMPRESSION: No active cardiopulmonary disease. Emphysema. Electronically Signed   By: Esmeralda Hedge M.D.   On: 12/12/2023 23:33   DG Chest 2 View Result Date: 12/09/2023 CLINICAL DATA:  Shortness of breath. EXAM: CHEST - 2 VIEW COMPARISON:  12/03/2023. FINDINGS: Redemonstration of opacity overlying the left upper lung zone which is essentially similar to prior studies and compatible with provided history of lung malignancy, status post treatment. Bilateral lungs otherwise appear hyperexpanded and hyperlucent with coarse bronchovascular markings, in keeping with COPD. No new dense consolidation or lung collapse. Bilateral costophrenic angles are clear. Normal cardio-mediastinal silhouette. No acute osseous abnormalities. The soft tissues are within normal limits. IMPRESSION: No active cardiopulmonary disease. COPD. Electronically Signed   By: Beula Brunswick M.D.   On: 12/09/2023 15:01    Microbiology: Results for orders placed or performed during the hospital encounter of 01/02/24  Resp panel by RT-PCR (RSV, Flu A&B, Covid) Anterior Nasal Swab     Status: None   Collection Time: 01/02/24  1:41 PM    Specimen: Anterior Nasal Swab  Result Value Ref Range Status   SARS Coronavirus 2 by RT PCR NEGATIVE NEGATIVE Final    Comment: (NOTE) SARS-CoV-2 target nucleic acids are NOT DETECTED.  The SARS-CoV-2 RNA is generally detectable in upper respiratory specimens during the acute phase of infection. The lowest concentration of SARS-CoV-2 viral copies this assay can detect is 138 copies/mL. A negative result does not preclude SARS-Cov-2 infection and should not be used as the sole basis for treatment or other patient management decisions. A negative result may occur with  improper specimen collection/handling, submission of specimen other than nasopharyngeal swab, presence of viral mutation(s) within the areas targeted by this assay, and inadequate number of viral copies(<138 copies/mL). A negative result must be combined with clinical observations, patient history, and epidemiological information. The expected result is  Negative.  Fact Sheet for Patients:  BloggerCourse.com  Fact Sheet for Healthcare Providers:  SeriousBroker.it  This test is no t yet approved or cleared by the [place]  FDA and  has been authorized for detection and/or diagnosis of SARS-CoV-2 by FDA under an Emergency Use Authorization (EUA). This EUA will remain  in effect (meaning this test can be used) for the duration of the COVID-19 declaration under Section 564(b)(1) of the Act, 21 U.S.C.section 360bbb-3(b)(1), unless the authorization is terminated  or revoked sooner.       Influenza A by PCR NEGATIVE NEGATIVE Final   Influenza B by PCR NEGATIVE NEGATIVE Final    Comment: (NOTE) The Xpert Xpress SARS-CoV-2/FLU/RSV plus assay is intended as an aid in the diagnosis of influenza from Nasopharyngeal swab specimens and should not be used as a sole basis for treatment. Nasal washings and aspirates are unacceptable for Xpert Xpress  SARS-CoV-2/FLU/RSV testing.  Fact Sheet for Patients: BloggerCourse.com  Fact Sheet for Healthcare Providers: SeriousBroker.it  This test is not yet approved or cleared by the [place]  FDA and has been authorized for detection and/or diagnosis of SARS-CoV-2 by FDA under an Emergency Use Authorization (EUA). This EUA will remain in effect (meaning this test can be used) for the duration of the COVID-19 declaration under Section 564(b)(1) of the Act, 21 U.S.C. section 360bbb-3(b)(1), unless the authorization is terminated or revoked.     Resp Syncytial Virus by PCR NEGATIVE NEGATIVE Final    Comment: (NOTE) Fact Sheet for Patients: BloggerCourse.com  Fact Sheet for Healthcare Providers: SeriousBroker.it  This test is not yet approved or cleared by the [place]  FDA and has been authorized for detection and/or diagnosis of SARS-CoV-2 by FDA under an Emergency Use Authorization (EUA). This EUA will remain in effect (meaning this test can be used) for the duration of the COVID-19 declaration under Section 564(b)(1) of the Act, 21 U.S.C. section 360bbb-3(b)(1), unless the authorization is terminated or revoked.  Performed at [facility], [address] [place], [place] [address]     Labs: CBC: Recent Labs  Lab 01/02/24 1300 01/03/24 0611  WBC 11.9* 7.7  NEUTROABS 10.9*  --   HGB 11.6* 10.9*  HCT 38.8 35.8*  MCV 96.8 95.5  PLT 218 232   Basic Metabolic Panel: Recent Labs  Lab 01/02/24 1300 01/03/24 0611 01/04/24 0807 01/06/24 0534  NA 140 141 140 137  K 3.6 4.4 3.7 4.2  CL 98 99 99 98  CO2 32 30 31 30   GLUCOSE 182* 136* 77 154*  BUN 17 19 24* 28*  CREATININE 0.50 0.51 0.71 0.67  CALCIUM  9.1 8.7* 8.5* 8.6*  MG  --   --  2.0 1.9   Liver Function Tests: Recent Labs  Lab 01/02/24 1300  AST 14*  ALT 17  ALKPHOS 52  BILITOT 0.2   PROT 6.3*  ALBUMIN 3.4*   CBG: No results for input(s): "GLUCAP" in the last 168 hours.  Discharge time spent: approximately 45 minutes spent on discharge counseling, evaluation of patient on day of discharge, and coordination of discharge planning with nursing, social work, pharmacy and case management  Signed: Ephriam Hashimoto, MD [facility] 01/07/2024

## 2024-01-07 NOTE — Progress Notes (Signed)
 PT Cancellation Note  Patient Details Name: Tracy Park MRN: [id number] DOB: [birth year]   Cancelled Treatment:    Reason Eval/Treat Not Completed: PT screened, no needs identified, will sign off. Order received. Chart reviewed. Secure chat with RN who reports pt has been ambulating and is mobilizing in room independently. Mobility team notes indicate pt has been ambulating good distances independently as well. D/C orders in place. Will sign off.     Tanda Falter, PT Acute Rehabilitation  Office: [phone]

## 2024-01-07 NOTE — Progress Notes (Signed)
 Mobility Specialist - Progress Note   01/07/24 1145  Mobility  Activity Ambulated independently in hallway  Level of Assistance Independent  Assistive Device None  Distance Ambulated (ft) 275 ft  Activity Response Tolerated well  Mobility Referral Yes  Mobility visit 1 Mobility  Mobility Specialist Start Time (ACUTE ONLY) 1056  Mobility Specialist Stop Time (ACUTE ONLY) 1105  Mobility Specialist Time Calculation (min) (ACUTE ONLY) 9 min   Pt received in bed and agreeable to mobility. No complaints during session. Pt to bed after session with all needs met.    Pre-mobility: 98% SpO2 (2L [place]) Post-mobility: 94%  SPO2 (2L [place])  Chief Technology Officer

## 2024-01-10 ENCOUNTER — Emergency Department (HOSPITAL_COMMUNITY)
Admission: EM | Admit: 2024-01-10 | Discharge: 2024-01-11 | Disposition: A | Discharge status: Home / Self Care | Attending: Emergency Medicine | Admitting: Emergency Medicine

## 2024-01-10 ENCOUNTER — Emergency Department (HOSPITAL_COMMUNITY)

## 2024-01-10 ENCOUNTER — Other Ambulatory Visit: Payer: Self-pay

## 2024-01-10 DIAGNOSIS — R059 Cough, unspecified: Secondary | ICD-10-CM | POA: Insufficient documentation

## 2024-01-10 DIAGNOSIS — R062 Wheezing: Secondary | ICD-10-CM

## 2024-01-10 DIAGNOSIS — Z85118 Personal history of other malignant neoplasm of bronchus and lung: Secondary | ICD-10-CM | POA: Insufficient documentation

## 2024-01-10 DIAGNOSIS — Z7901 Long term (current) use of anticoagulants: Secondary | ICD-10-CM | POA: Insufficient documentation

## 2024-01-10 DIAGNOSIS — J441 Chronic obstructive pulmonary disease with (acute) exacerbation: Secondary | ICD-10-CM | POA: Diagnosis not present

## 2024-01-10 DIAGNOSIS — R0781 Pleurodynia: Secondary | ICD-10-CM

## 2024-01-10 LAB — COMPREHENSIVE METABOLIC PANEL WITH GFR
ALT: 16 U/L (ref 0–44)
AST: 16 U/L (ref 15–41)
Albumin: 3.3 g/dL — ABNORMAL LOW (ref 3.5–5.0)
Alkaline Phosphatase: 53 U/L (ref 38–126)
Anion gap: 10 (ref 5–15)
BUN: 15 mg/dL (ref 8–23)
CO2: 29 mmol/L (ref 22–32)
Calcium: 9 mg/dL (ref 8.9–10.3)
Chloride: 102 mmol/L (ref 98–111)
Creatinine, Ser: 0.47 mg/dL (ref 0.44–1.00)
GFR, Estimated: >60 mL/min (ref >60)
Glucose, Bld: 113 mg/dL — ABNORMAL HIGH (ref 70–99)
Potassium: 3.7 mmol/L (ref 3.5–5.1)
Sodium: 141 mmol/L (ref 135–145)
Total Bilirubin: 0.4 mg/dL (ref 0.0–1.2)
Total Protein: 6.1 g/dL — ABNORMAL LOW (ref 6.5–8.1)

## 2024-01-10 LAB — CBC WITH DIFFERENTIAL/PLATELET
Abs Immature Granulocytes: 0.11 10*3/uL — ABNORMAL HIGH (ref 0.00–0.07)
Basophils Absolute: 0 10*3/uL (ref 0.0–0.1)
Basophils Relative: 0 %
Eosinophils Absolute: 0.1 10*3/uL (ref 0.0–0.5)
Eosinophils Relative: 1 %
HCT: 40.5 % (ref 36.0–46.0)
Hemoglobin: 12.6 g/dL (ref 12.0–15.0)
Immature Granulocytes: 1 %
Lymphocytes Relative: 21 %
Lymphs Abs: 3.2 10*3/uL (ref 0.7–4.0)
MCH: 29.4 pg (ref 26.0–34.0)
MCHC: 31.1 g/dL (ref 30.0–36.0)
MCV: 94.4 fL (ref 80.0–100.0)
Monocytes Absolute: 1.1 10*3/uL — ABNORMAL HIGH (ref 0.1–1.0)
Monocytes Relative: 7 %
Neutro Abs: 10.6 10*3/uL — ABNORMAL HIGH (ref 1.7–7.7)
Neutrophils Relative %: 70 %
Platelets: 252 10*3/uL (ref 150–400)
RBC: 4.29 MIL/uL (ref 3.87–5.11)
RDW: 14.2 % (ref 11.5–15.5)
WBC: 15.1 10*3/uL — ABNORMAL HIGH (ref 4.0–10.5)
nRBC: 0 % (ref 0.0–0.2)

## 2024-01-10 LAB — RESP PANEL BY RT-PCR (RSV, FLU A&B, COVID)  RVPGX2
Influenza A by PCR: NEGATIVE
Influenza B by PCR: NEGATIVE
Resp Syncytial Virus by PCR: NEGATIVE
SARS Coronavirus 2 by RT PCR: NEGATIVE

## 2024-01-10 LAB — LACTIC ACID, PLASMA
Lactic Acid, Venous: 0.7 mmol/L (ref 0.5–1.9)
Lactic Acid, Venous: 0.7 mmol/L (ref 0.5–1.9)

## 2024-01-10 LAB — TROPONIN I (HIGH SENSITIVITY)
Troponin I (High Sensitivity): 12 ng/L (ref <18)
Troponin I (High Sensitivity): 11 ng/L (ref <18)

## 2024-01-10 LAB — LIPASE, BLOOD: Lipase: 30 U/L (ref 11–51)

## 2024-01-10 MED ORDER — PREDNISONE 50 MG PO TABS
50.0000 mg | ORAL_TABLET | Freq: Every day | ORAL | 0 refills | Status: DC
Start: 2024-01-11 — End: 2024-01-23

## 2024-01-10 MED ORDER — IOHEXOL 350 MG/ML SOLN
80.0000 mL | Freq: Once | INTRAVENOUS | Status: AC | PRN
  Administered 2024-01-10: 80 mL via INTRAVENOUS

## 2024-01-10 MED ORDER — METHYLPREDNISOLONE SODIUM SUCC 125 MG IJ SOLR
125.0000 mg | Freq: Once | INTRAMUSCULAR | Status: AC
  Administered 2024-01-10: 125 mg via INTRAVENOUS
  Filled 2024-01-10: qty 2

## 2024-01-10 MED ORDER — OXYCODONE-ACETAMINOPHEN 5-325 MG PO TABS: 1.0000 | ORAL_TABLET | Freq: Four times a day (QID) | ORAL | 0 refills | Status: DC | PRN

## 2024-01-10 MED ORDER — IPRATROPIUM-ALBUTEROL 0.5-2.5 (3) MG/3ML IN SOLN
3.0000 mL | Freq: Once | RESPIRATORY_TRACT | Status: AC
  Administered 2024-01-10: 3 mL via RESPIRATORY_TRACT
  Filled 2024-01-10: qty 3

## 2024-01-10 MED ORDER — ONDANSETRON 4 MG PO TBDP: 4.0000 mg | ORAL_TABLET | Freq: Three times a day (TID) | ORAL | 0 refills | Status: DC | PRN

## 2024-01-10 MED ORDER — FENTANYL CITRATE PF 50 MCG/ML IJ SOSY
50.0000 ug | PREFILLED_SYRINGE | Freq: Once | INTRAMUSCULAR | Status: AC
  Administered 2024-01-10: 50 ug via INTRAVENOUS
  Filled 2024-01-10: qty 1

## 2024-01-10 NOTE — ED Notes (Signed)
 [facility] called and transport arranged.

## 2024-01-10 NOTE — ED Provider Notes (Signed)
 [name] Provider Note   CSN: [id number] Arrival date & time: 01/10/24  1622     History  Chief Complaint  Patient presents with   Shortness of Breath   Flank Pain    Tracy Park is a 68 y.o. female.  The history is provided by the patient and medical records. No language interpreter was used.  Shortness of Breath Severity:  Severe Onset quality:  Gradual Duration:  3 days Timing:  Constant Chronicity:  Recurrent Context: URI   Relieved by:  Nothing Worsened by:  Coughing and deep breathing Ineffective treatments:  Oxygen  (breathing treatments) Associated symptoms: chest pain, cough, sputum production and wheezing   Associated symptoms: no abdominal pain, no fever, no headaches, no neck pain, no rash and no vomiting   Risk factors: hx of cancer   Risk factors: no hx of PE/DVT   Flank Pain Associated symptoms include chest pain and shortness of breath. Pertinent negatives include no abdominal pain and no headaches.       Home Medications Prior to Admission medications   Medication Sig Start Date End Date Taking? Authorizing Provider  acetaminophen  (TYLENOL ) 500 MG tablet Take 500 mg by mouth every 6 (six) hours as needed for moderate pain (pain score 4-6).    [provider]  albuterol  (PROVENTIL ) (2.5 MG/3ML) 0.083% nebulizer solution Take 3 mLs (2.5 mg total) by nebulization every 4 (four) hours while awake for 3 days, THEN 3 mLs (2.5 mg total) every 4 (four) hours as needed for wheezing or shortness of breath. Patient taking differently: Inhale 3 mLs (2.5 mg total) every 4 (four) hours as needed for wheezing or shortness of breath. 10/18/23 12/28/23  Dalene Duck, MD  albuterol  (PROVENTIL ) (2.5 MG/3ML) 0.083% nebulizer solution Take 2.5 mg by nebulization every 4 (four) hours as needed for wheezing or shortness of breath.    [provider]  albuterol  (VENTOLIN  HFA) 108 (90 Base) MCG/ACT inhaler  Inhale 2 puffs into the lungs every 6 (six) hours as needed for wheezing or shortness of breath. 11/07/23   Amin, Ankit C, MD  apixaban  (ELIQUIS ) 5 MG TABS tablet Take 1 tablet (5 mg total) by mouth 2 (two) times daily. 06/06/23   Lorita Rosa, MD  benzonatate  (TESSALON ) 100 MG capsule Take 1 capsule (100 mg total) by mouth 3 (three) times daily as needed for cough. 12/19/23   Magdalene School, MD  Budeson-Glycopyrrol-Formoterol  (BREZTRI  AEROSPHERE) 160-9-4.8 MCG/ACT AERO Inhale 1 puff into the lungs in the morning and at bedtime.    [provider]  gatifloxacin  (ZYMAXID ) 0.5 % SOLN Place 1 drop into the left eye 4 (four) times daily.    [provider]  levothyroxine  (SYNTHROID ) 125 MCG tablet Take 125 mcg by mouth daily before breakfast.    [provider]  losartan  (COZAAR ) 25 MG tablet Take 12.5 mg by mouth daily. 08/29/23   [provider]  magnesium  oxide (MAG-OX) 400 (240 Mg) MG tablet Take 400 mg by mouth in the morning.    [provider]  metoprolol  succinate (TOPROL -XL) 25 MG 24 hr tablet Take 0.5 tablets (12.5 mg total) by mouth daily. 06/22/23 12/28/23  Verlyn Goad, MD  metoprolol  succinate (TOPROL -XL) 25 MG 24 hr tablet Take 12.5 mg by mouth daily.    [provider]  nitroGLYCERIN  (NITROSTAT ) 0.4 MG SL tablet Place 0.4 mg under the tongue every 5 (five) minutes as needed for chest pain.    [provider]  oxyCODONE  (ROXICODONE ) 5 MG immediate release tablet Take 1 tablet (5 mg total) by mouth every 6 (six) hours as needed for severe pain (pain score 7-10). 12/03/23   Curatolo, Adam, DO  OXYGEN  Inhale 2 L/min into the lungs continuous.    [provider]  polyvinyl alcohol  (LIQUIFILM TEARS) 1.4 % ophthalmic solution Place 1 drop into both eyes in the morning, at noon, in the evening, and at bedtime.    [provider]  prednisoLONE  acetate (PRED FORTE ) 1 % ophthalmic suspension Place 1 drop into the left  eye 4 (four) times daily. 11/19/23   [provider]  rosuvastatin  (CRESTOR ) 20 MG tablet Take 20 mg by mouth daily.    [provider]  sertraline  (ZOLOFT ) 25 MG tablet Take 25 mg by mouth in the morning.    [provider]  SYSTANE COMPLETE PF 0.6 % SOLN Place 1 drop into both eyes 4 (four) times daily as needed.    [provider]      Allergies    Red dye #40 (allura red), Strawberry extract, Tomato, Aspirin, Tape, and Wound dressing adhesive    Review of Systems   Review of Systems  Constitutional:  Positive for chills and fatigue. Negative for fever.  HENT:  Negative for congestion.   Eyes:  Negative for visual disturbance.  Respiratory:  Positive for cough, sputum production, chest tightness, shortness of breath and wheezing.   Cardiovascular:  Positive for chest pain. Negative for palpitations and leg swelling.  Gastrointestinal:  Positive for diarrhea and nausea. Negative for abdominal pain, constipation and vomiting.  Genitourinary:  Positive for flank pain. Negative for dysuria.  Musculoskeletal:  Negative for back pain, neck pain and neck stiffness.  Skin:  Negative for rash and wound.  Neurological:  Negative for light-headedness and headaches.  Psychiatric/Behavioral:  Negative for agitation and confusion.   All other systems reviewed and are negative.   Physical Exam Updated Vital Signs BP 111/78   Pulse 83   Temp 98.1 F (36.7 C) (Oral)   Resp (!) 22   SpO2 96%  Physical Exam Vitals and nursing note reviewed.  Constitutional:      General: She is not in acute distress.    Appearance: She is well-developed. She is ill-appearing. She is not toxic-appearing or diaphoretic.  HENT:     Head: Normocephalic and atraumatic.  Eyes:     Extraocular Movements: Extraocular movements intact.     Conjunctiva/sclera: Conjunctivae normal.     Pupils: Pupils are equal, round, and reactive to light.  Cardiovascular:     Rate and Rhythm:  Regular rhythm. Tachycardia present.     Heart sounds: No murmur heard. Pulmonary:     Effort: Pulmonary effort is normal. Tachypnea present. No respiratory distress.     Breath sounds: Wheezing and rhonchi present. No rales.  Chest:     Chest wall: Tenderness present.  Abdominal:     Palpations: Abdomen is soft.     Tenderness: There is no abdominal tenderness.  Musculoskeletal:        General: No swelling.     Cervical back: Neck supple.     Right lower leg: No tenderness. No edema.     Left lower leg: No tenderness. No edema.  Skin:    General: Skin is warm and dry.     Capillary Refill: Capillary refill takes less than 2 seconds.     Findings: No erythema.  Neurological:     General: No  focal deficit present.     Mental Status: She is alert.  Psychiatric:        Mood and Affect: Mood normal.     ED Results / Procedures / Treatments   Labs (all labs ordered are listed, but only abnormal results are displayed) Labs Reviewed  CBC WITH DIFFERENTIAL/PLATELET - Abnormal; Notable for the following components:      Result Value   WBC 15.1 (*)    Neutro Abs 10.6 (*)    Monocytes Absolute 1.1 (*)    Abs Immature Granulocytes 0.11 (*)    All other components within normal limits  COMPREHENSIVE METABOLIC PANEL WITH GFR - Abnormal; Notable for the following components:   Glucose, Bld 113 (*)    Total Protein 6.1 (*)    Albumin 3.3 (*)    All other components within normal limits  CULTURE, BLOOD (ROUTINE X 2)  CULTURE, BLOOD (ROUTINE X 2)  RESP PANEL BY RT-PCR (RSV, FLU A&B, COVID)  RVPGX2  LACTIC ACID, PLASMA  LACTIC ACID, PLASMA  LIPASE, BLOOD  TROPONIN I (HIGH SENSITIVITY)  TROPONIN I (HIGH SENSITIVITY)    EKG EKG Interpretation Date/Time:  Saturday Jan 10 2024 19:23:03 EDT Ventricular Rate:  82 PR Interval:  125 QRS Duration:  104 QT Interval:  391 QTC Calculation: 457 R Axis:   72  Text Interpretation: Sinus rhythm RAE, consider biatrial enlargement RSR' in  V1 or V2, right VCD or RVH Probable left ventricular hypertrophy when compared to prior, similar appearance No STEMI Confirmed by Wynell Heath ([id number]) on 01/10/2024 7:24:10 PM  Radiology CT Angio Chest PE W and/or Wo Contrast Result Date: 01/10/2024 CLINICAL DATA:  Shortness of breath and right-sided pain, initial encounter EXAM: CT ANGIOGRAPHY CHEST WITH CONTRAST TECHNIQUE: Multidetector CT imaging of the chest was performed using the standard protocol during bolus administration of intravenous contrast. Multiplanar CT image reconstructions and MIPs were obtained to evaluate the vascular anatomy. RADIATION DOSE REDUCTION: This exam was performed according to the departmental dose-optimization program which includes automated exposure control, adjustment of the mA and/or kV according to patient size and/or use of iterative reconstruction technique. CONTRAST:  80mL OMNIPAQUE  IOHEXOL  350 MG/ML SOLN COMPARISON:  12/13/2023 FINDINGS: Cardiovascular: Thoracic aorta and its branches demonstrate atherosclerotic calcification. No aneurysmal dilatation or dissection is noted. The pulmonary artery shows a normal branching pattern bilaterally. No intraluminal filling defect to suggest pulmonary embolism is seen. Mediastinum/Nodes: Thoracic inlet is within normal limits. Mediastinal shift to the left is noted related to volume loss from prior left upper lobectomy. This is stable from the prior study. No hilar or mediastinal adenopathy is noted. The esophagus is within normal limits. Lungs/Pleura: Lungs are well aerated bilaterally. Diffuse emphysematous changes are identified. Some areas of pleural thickening are noted on the right stable from the prior exam. Stable consolidation in the posterior aspect of the right upper lobe is again seen. Changes of prior left upper lobectomy are noted with volume loss as described. Some stable areas of scarring are noted in the residual left lower lobe. No focal confluent infiltrate is  seen. Upper Abdomen: No acute abnormality. Musculoskeletal: No chest wall abnormality. No acute or significant osseous findings. Review of the MIP images confirms the above findings. IMPRESSION: Postsurgical changes consistent with left upper lobectomy. No evidence of pulmonary embolism. Chronic changes in both lungs back to prior exams in 2025. Aortic Atherosclerosis (ICD10-I70.0) and Emphysema (ICD10-J43.9). Electronically Signed   By: Violeta Grey M.D.   On: 01/10/2024 21:14  Procedures Procedures    Medications Ordered in ED Medications  ipratropium-albuterol  (DUONEB) 0.5-2.5 (3) MG/3ML nebulizer solution 3 mL (3 mLs Nebulization Given 01/10/24 1747)  methylPREDNISolone  sodium succinate (SOLU-MEDROL ) 125 mg/2 mL injection 125 mg (125 mg Intravenous Given 01/10/24 1746)  fentaNYL  (SUBLIMAZE ) injection 50 mcg (50 mcg Intravenous Given 01/10/24 1747)  iohexol  (OMNIPAQUE ) 350 MG/ML injection 80 mL (80 mLs Intravenous Contrast Given 01/10/24 2100)    ED Course/ Medical Decision Making/ A&P                                 Medical Decision Making Amount and/or Complexity of Data Reviewed Labs: ordered. Radiology: ordered.  Risk Prescription drug management.    Tracy Park is a 69 y.o. female with past medical history significant for COPD on 2 L home oxygen , lung cancer status post previous lobectomy, paroxysmal atrial fibrillation on Eliquis  therapy, PTSD, previous kidney stones, bipolar disorder, thyroid  disease, and admission last month for COPD exacerbation who presents with pleuritic chest pain, shortness of breath, productive cough, and fatigue.  According to patient, for the last few days she has been having worsening chest pain and shortness of breath.  She reports it is in her right chest and wraps around to the front and around to the back.  It is very pleuritic.  She is short of breath.  She is increased work of breathing and is using her inhalers at home.  She denies any trauma.   She denies any vomiting was had some nausea.  She reports no constipation, diarrhea, or urinary changes.  Denies leg pain or leg swelling although she reports some malaise and fatigue.  She says the person she is staying with has had some viral symptoms and she is concerned they could have the flu.  She is denying neck pain or neck stiffness at this time.  Reports the pain is moderate to severe.  On exam, lungs have wheezing and rhonchi diffusely.  Chest is slightly tender.  Right back is tender.  No rash to suggest shingles.  CVA is nontender.  Lower abdomen nontender.  Legs are nontender and nonedematous with good pulses in extremities.  Patient has increased work of breathing and is slightly tachycardic.  She is not hypotensive or febrile here.  Will get EKG.  Clinically with her tachycardia, tachypnea, creased work of breathing, and her pleuritic discomfort and shortness of breath I do feel we need to get imaging to rule out pulmonary embolism given her cancer history.  Will get screening labs.  Will give her some pain medicine and breathing treatment.  Give a dose of Solu-Medrol  for the suspected component of COPD exacerbation.  Will get cardiac workup.  Due to the patient's creased work of breathing, if she does not improve with breathing treatment and steroids, patient may require a repeat admission as she has been admitted several times in the last few months for respiratory difficulty.  Anticipate reassessment after workup to determine disposition.  Patient's workup began to return.  She does have a leukocytosis but otherwise the workup was relatively reassuring.  Her CT scan did not show pulmonary [place] and did not show evidence of new consolidation.  It shows chronic changes.  Lactic acid normal x 2 and troponin normal x 2.  She is negative for COVID/flu/RSV.  We had a shared decision-making conversation and upon reassessment and her breathing is doing much better.  Wheezing has  significant proved and she is breathing more normally.  We discussed admitting for COPD exacerbation as she has had admitted for this in the past however she would like to try going home.  Will give a burst of steroids and she understands extremely strict return precautions for new or worsened symptoms.  Will give prescription for some pain medicine and nausea medicine help with her symptoms and she will get the burst of steroids as well.  She had no other questions or concerns and was discharged in stable condition on her home oxygen  feeling much better.         Final Clinical Impression(s) / ED Diagnoses Final diagnoses:  Wheezing  COPD exacerbation (HCC)  Pleuritic chest pain    Rx / DC Orders ED Discharge Orders          Ordered    predniSONE  (DELTASONE ) 50 MG tablet  Daily with breakfast        01/10/24 2250    oxyCODONE -acetaminophen  (PERCOCET/ROXICET) 5-325 MG tablet  Every 6 hours PRN        01/10/24 2250    ondansetron  (ZOFRAN -ODT) 4 MG disintegrating tablet  Every 8 hours PRN        01/10/24 2250           Clinical Impression: 1. Wheezing   2. COPD exacerbation (HCC)   3. Pleuritic chest pain     Disposition: Discharge  Condition: Good  I have discussed the results, Dx and Tx plan with the pt(& family if present). He/she/they expressed understanding and agree(s) with the plan. Discharge instructions discussed at great length. Strict return precautions discussed and pt &/or family have verbalized understanding of the instructions. No further questions at time of discharge.    New Prescriptions   ONDANSETRON  (ZOFRAN -ODT) 4 MG DISINTEGRATING TABLET    Take 1 tablet (4 mg total) by mouth every 8 (eight) hours as needed for nausea or vomiting.   OXYCODONE -ACETAMINOPHEN  (PERCOCET/ROXICET) 5-325 MG TABLET    Take 1 tablet by mouth every 6 (six) hours as needed for severe pain (pain score 7-10).   PREDNISONE  (DELTASONE ) 50 MG TABLET    Take 1 tablet (50 mg total) by  mouth daily with breakfast for 4 days.    Follow Up: Chana Comas, FNP [id number] [place] [address] [phone]     [facility] Emergency Department at [facility] [address] [place]  [address] [phone]       Kenyon Eshleman, Marine Sia, MD 01/10/24 2256

## 2024-01-10 NOTE — Discharge Instructions (Signed)
 Your history, exam, workup today led us  to get labs and CT imaging that overall did not show evidence of new pneumonia, collapsed lung, blood clot, or a cardiac cause of your symptoms.  I suspect your chest discomfort, tightness, and shortness of breath and wheezing were related to the COPD exacerbation.  As the steroids started to kick in, your symptoms improved and we feel you are safe for discharge home as you had reassuring vital signs on your home oxygen  level.  Please use the pain medicine nausea medicine and burst of steroids for the next few days still with symptoms and please call and follow-up with your primary doctor soon as possible.  If any symptoms were to change or worsen acutely, we agreed they should return for reassessment.  We had a shared decision-making conversation about the possibility of admission given how hard you were breathing when you arrived however, given the improvement we feel it is reasonable to try going home for outpatient management.

## 2024-01-10 NOTE — ED Notes (Addendum)
 Patient transported to CT and returned to room.

## 2024-01-10 NOTE — ED Triage Notes (Addendum)
 Pt presents to the ED from home with complaints of shortness of breath and right flank pain. Symptoms began 2 days ago. Pt is normally on 2L [place] at home. EMS administered 1 duoneb en route. Aox4. Hx of COPD  EMS Vitals:  BP 130/80 HR 98 O2: 99% 2L CBG : 142

## 2024-01-13 ENCOUNTER — Emergency Department (HOSPITAL_COMMUNITY)

## 2024-01-13 ENCOUNTER — Encounter (HOSPITAL_COMMUNITY): Payer: Self-pay | Admitting: Emergency Medicine

## 2024-01-13 ENCOUNTER — Inpatient Hospital Stay (HOSPITAL_COMMUNITY)
Admission: EM | Admit: 2024-01-13 | Discharge: 2024-01-23 | DRG: 190 | Disposition: A | Discharge status: Home / Self Care | Attending: Internal Medicine | Admitting: Internal Medicine

## 2024-01-13 ENCOUNTER — Other Ambulatory Visit: Payer: Self-pay

## 2024-01-13 DIAGNOSIS — F319 Bipolar disorder, unspecified: Secondary | ICD-10-CM | POA: Diagnosis present

## 2024-01-13 DIAGNOSIS — Z87442 Personal history of urinary calculi: Secondary | ICD-10-CM

## 2024-01-13 DIAGNOSIS — Z681 Body mass index (BMI) 19 or less, adult: Secondary | ICD-10-CM | POA: Diagnosis not present

## 2024-01-13 DIAGNOSIS — E785 Hyperlipidemia, unspecified: Secondary | ICD-10-CM | POA: Diagnosis present

## 2024-01-13 DIAGNOSIS — J439 Emphysema, unspecified: Secondary | ICD-10-CM | POA: Diagnosis present

## 2024-01-13 DIAGNOSIS — Z886 Allergy status to analgesic agent status: Secondary | ICD-10-CM

## 2024-01-13 DIAGNOSIS — I252 Old myocardial infarction: Secondary | ICD-10-CM | POA: Diagnosis not present

## 2024-01-13 DIAGNOSIS — Z9981 Dependence on supplemental oxygen: Secondary | ICD-10-CM | POA: Diagnosis not present

## 2024-01-13 DIAGNOSIS — Z91018 Allergy to other foods: Secondary | ICD-10-CM

## 2024-01-13 DIAGNOSIS — Z8249 Family history of ischemic heart disease and other diseases of the circulatory system: Secondary | ICD-10-CM

## 2024-01-13 DIAGNOSIS — F17210 Nicotine dependence, cigarettes, uncomplicated: Secondary | ICD-10-CM | POA: Diagnosis present

## 2024-01-13 DIAGNOSIS — Z1623 Resistance to quinolones and fluoroquinolones: Secondary | ICD-10-CM | POA: Diagnosis present

## 2024-01-13 DIAGNOSIS — Z902 Acquired absence of lung [part of]: Secondary | ICD-10-CM

## 2024-01-13 DIAGNOSIS — I10 Essential (primary) hypertension: Secondary | ICD-10-CM | POA: Diagnosis present

## 2024-01-13 DIAGNOSIS — J441 Chronic obstructive pulmonary disease with (acute) exacerbation: Principal | ICD-10-CM | POA: Diagnosis present

## 2024-01-13 DIAGNOSIS — Z79899 Other long term (current) drug therapy: Secondary | ICD-10-CM

## 2024-01-13 DIAGNOSIS — G4733 Obstructive sleep apnea (adult) (pediatric): Secondary | ICD-10-CM | POA: Diagnosis present

## 2024-01-13 DIAGNOSIS — E440 Moderate protein-calorie malnutrition: Secondary | ICD-10-CM | POA: Diagnosis present

## 2024-01-13 DIAGNOSIS — I480 Paroxysmal atrial fibrillation: Secondary | ICD-10-CM | POA: Diagnosis present

## 2024-01-13 DIAGNOSIS — B965 Pseudomonas (aeruginosa) (mallei) (pseudomallei) as the cause of diseases classified elsewhere: Secondary | ICD-10-CM | POA: Diagnosis not present

## 2024-01-13 DIAGNOSIS — Z7901 Long term (current) use of anticoagulants: Secondary | ICD-10-CM | POA: Diagnosis not present

## 2024-01-13 DIAGNOSIS — Z66 Do not resuscitate: Secondary | ICD-10-CM | POA: Diagnosis present

## 2024-01-13 DIAGNOSIS — Z833 Family history of diabetes mellitus: Secondary | ICD-10-CM

## 2024-01-13 DIAGNOSIS — J9611 Chronic respiratory failure with hypoxia: Secondary | ICD-10-CM | POA: Diagnosis not present

## 2024-01-13 DIAGNOSIS — Z85118 Personal history of other malignant neoplasm of bronchus and lung: Secondary | ICD-10-CM | POA: Diagnosis not present

## 2024-01-13 DIAGNOSIS — R54 Age-related physical debility: Secondary | ICD-10-CM | POA: Diagnosis present

## 2024-01-13 DIAGNOSIS — Z90710 Acquired absence of both cervix and uterus: Secondary | ICD-10-CM

## 2024-01-13 DIAGNOSIS — Z79890 Hormone replacement therapy (postmenopausal): Secondary | ICD-10-CM | POA: Diagnosis not present

## 2024-01-13 DIAGNOSIS — Z716 Tobacco abuse counseling: Secondary | ICD-10-CM

## 2024-01-13 DIAGNOSIS — J9622 Acute and chronic respiratory failure with hypercapnia: Secondary | ICD-10-CM | POA: Diagnosis present

## 2024-01-13 DIAGNOSIS — E876 Hypokalemia: Secondary | ICD-10-CM | POA: Diagnosis present

## 2024-01-13 DIAGNOSIS — J9621 Acute and chronic respiratory failure with hypoxia: Secondary | ICD-10-CM | POA: Diagnosis present

## 2024-01-13 DIAGNOSIS — Z91048 Other nonmedicinal substance allergy status: Secondary | ICD-10-CM

## 2024-01-13 LAB — RESP PANEL BY RT-PCR (RSV, FLU A&B, COVID)  RVPGX2
Influenza A by PCR: NEGATIVE
Influenza B by PCR: NEGATIVE
Resp Syncytial Virus by PCR: NEGATIVE
SARS Coronavirus 2 by RT PCR: NEGATIVE

## 2024-01-13 LAB — CBC WITH DIFFERENTIAL/PLATELET
Abs Immature Granulocytes: 0.08 10*3/uL — ABNORMAL HIGH (ref 0.00–0.07)
Basophils Absolute: 0 10*3/uL (ref 0.0–0.1)
Basophils Relative: 0 %
Eosinophils Absolute: 0 10*3/uL (ref 0.0–0.5)
Eosinophils Relative: 0 %
HCT: 42.1 % (ref 36.0–46.0)
Hemoglobin: 12.5 g/dL (ref 12.0–15.0)
Immature Granulocytes: 1 %
Lymphocytes Relative: 13 %
Lymphs Abs: 1.8 10*3/uL (ref 0.7–4.0)
MCH: 28.9 pg (ref 26.0–34.0)
MCHC: 29.7 g/dL — ABNORMAL LOW (ref 30.0–36.0)
MCV: 97.5 fL (ref 80.0–100.0)
Monocytes Absolute: 0.6 10*3/uL (ref 0.1–1.0)
Monocytes Relative: 5 %
Neutro Abs: 11.1 10*3/uL — ABNORMAL HIGH (ref 1.7–7.7)
Neutrophils Relative %: 81 %
Platelets: 257 10*3/uL (ref 150–400)
RBC: 4.32 MIL/uL (ref 3.87–5.11)
RDW: 14 % (ref 11.5–15.5)
WBC: 13.6 10*3/uL — ABNORMAL HIGH (ref 4.0–10.5)
nRBC: 0 % (ref 0.0–0.2)

## 2024-01-13 LAB — BASIC METABOLIC PANEL WITH GFR
Anion gap: 10 (ref 5–15)
BUN: 17 mg/dL (ref 8–23)
CO2: 29 mmol/L (ref 22–32)
Calcium: 8.1 mg/dL — ABNORMAL LOW (ref 8.9–10.3)
Chloride: 102 mmol/L (ref 98–111)
Creatinine, Ser: 0.5 mg/dL (ref 0.44–1.00)
GFR, Estimated: >60 mL/min (ref >60)
Glucose, Bld: 123 mg/dL — ABNORMAL HIGH (ref 70–99)
Potassium: 3.3 mmol/L — ABNORMAL LOW (ref 3.5–5.1)
Sodium: 141 mmol/L (ref 135–145)

## 2024-01-13 MED ORDER — BENZONATATE 100 MG PO CAPS
100.0000 mg | ORAL_CAPSULE | Freq: Three times a day (TID) | ORAL | Status: DC | PRN
  Administered 2024-01-14 – 2024-01-17 (×4): 100 mg via ORAL
  Filled 2024-01-13 (×5): qty 1

## 2024-01-13 MED ORDER — POTASSIUM CHLORIDE CRYS ER 20 MEQ PO TBCR
20.0000 meq | EXTENDED_RELEASE_TABLET | ORAL | Status: AC
  Administered 2024-01-14 (×3): 20 meq via ORAL
  Filled 2024-01-13 (×2): qty 1

## 2024-01-13 MED ORDER — MAGNESIUM SULFATE 2 GM/50ML IV SOLN
2.0000 g | Freq: Once | INTRAVENOUS | Status: AC
  Administered 2024-01-13: 2 g via INTRAVENOUS
  Filled 2024-01-13: qty 50

## 2024-01-13 MED ORDER — ROSUVASTATIN CALCIUM 10 MG PO TABS
20.0000 mg | ORAL_TABLET | Freq: Every day | ORAL | Status: DC
  Administered 2024-01-14 – 2024-01-23 (×10): 20 mg via ORAL
  Filled 2024-01-13 (×10): qty 2

## 2024-01-13 MED ORDER — APIXABAN 5 MG PO TABS
5.0000 mg | ORAL_TABLET | Freq: Two times a day (BID) | ORAL | Status: DC
  Administered 2024-01-14 – 2024-01-23 (×20): 5 mg via ORAL
  Filled 2024-01-13 (×20): qty 1

## 2024-01-13 MED ORDER — POLYVINYL ALCOHOL 1.4 % OP SOLN: 1.0000 [drp] | OPHTHALMIC | Status: DC | PRN

## 2024-01-13 MED ORDER — IPRATROPIUM-ALBUTEROL 0.5-2.5 (3) MG/3ML IN SOLN
3.0000 mL | Freq: Once | RESPIRATORY_TRACT | Status: AC
  Administered 2024-01-13: 3 mL via RESPIRATORY_TRACT
  Filled 2024-01-13: qty 3

## 2024-01-13 MED ORDER — SERTRALINE HCL 25 MG PO TABS
25.0000 mg | ORAL_TABLET | Freq: Every day | ORAL | Status: DC
  Administered 2024-01-14 – 2024-01-23 (×10): 25 mg via ORAL
  Filled 2024-01-13 (×10): qty 1

## 2024-01-13 MED ORDER — LEVOTHYROXINE SODIUM 125 MCG PO TABS
125.0000 ug | ORAL_TABLET | Freq: Every day | ORAL | Status: DC
  Administered 2024-01-14 – 2024-01-23 (×10): 125 ug via ORAL
  Filled 2024-01-13 (×10): qty 1

## 2024-01-13 MED ORDER — BUDESON-GLYCOPYRROL-FORMOTEROL 160-9-4.8 MCG/ACT IN AERO
1.0000 | INHALATION_SPRAY | Freq: Two times a day (BID) | RESPIRATORY_TRACT | Status: DC
  Administered 2024-01-14: 1 via RESPIRATORY_TRACT
  Filled 2024-01-13: qty 5.9

## 2024-01-13 MED ORDER — ONDANSETRON 4 MG PO TBDP: 4.0000 mg | ORAL_TABLET | Freq: Three times a day (TID) | ORAL | Status: DC | PRN

## 2024-01-13 MED ORDER — METOPROLOL SUCCINATE ER 25 MG PO TB24
12.5000 mg | ORAL_TABLET | Freq: Every day | ORAL | Status: DC
  Administered 2024-01-14 – 2024-01-23 (×10): 12.5 mg via ORAL
  Filled 2024-01-13 (×10): qty 1

## 2024-01-13 MED ORDER — METHYLPREDNISOLONE SODIUM SUCC 125 MG IJ SOLR
90.0000 mg | Freq: Two times a day (BID) | INTRAMUSCULAR | Status: DC
  Administered 2024-01-14: 90 mg via INTRAVENOUS
  Filled 2024-01-13: qty 2

## 2024-01-13 MED ORDER — ACETAMINOPHEN 500 MG PO TABS
500.0000 mg | ORAL_TABLET | Freq: Four times a day (QID) | ORAL | Status: DC | PRN
  Administered 2024-01-17 – 2024-01-20 (×2): 500 mg via ORAL
  Filled 2024-01-13 (×2): qty 1

## 2024-01-13 MED ORDER — GATIFLOXACIN 0.5 % OP SOLN
1.0000 [drp] | Freq: Four times a day (QID) | OPHTHALMIC | Status: DC
  Administered 2024-01-14 – 2024-01-23 (×38): 1 [drp] via OPHTHALMIC
  Filled 2024-01-13 (×2): qty 2.5

## 2024-01-13 MED ORDER — OXYCODONE HCL 5 MG PO TABS
5.0000 mg | ORAL_TABLET | Freq: Four times a day (QID) | ORAL | Status: DC | PRN
  Administered 2024-01-14 – 2024-01-21 (×9): 5 mg via ORAL
  Filled 2024-01-13 (×9): qty 1

## 2024-01-13 MED ORDER — PREDNISONE 20 MG PO TABS: 40.0000 mg | ORAL_TABLET | Freq: Every day | ORAL | Status: DC

## 2024-01-13 MED ORDER — NITROGLYCERIN 0.4 MG SL SUBL: 0.4000 mg | SUBLINGUAL_TABLET | SUBLINGUAL | Status: DC | PRN

## 2024-01-13 MED ORDER — ALBUTEROL SULFATE (2.5 MG/3ML) 0.083% IN NEBU
2.5000 mg | INHALATION_SOLUTION | RESPIRATORY_TRACT | Status: DC | PRN
  Administered 2024-01-14: 2.5 mg via RESPIRATORY_TRACT
  Filled 2024-01-13: qty 3

## 2024-01-13 MED ORDER — ALBUTEROL SULFATE (2.5 MG/3ML) 0.083% IN NEBU
10.0000 mg/h | INHALATION_SOLUTION | RESPIRATORY_TRACT | Status: DC
  Administered 2024-01-13: 10 mg/h via RESPIRATORY_TRACT
  Filled 2024-01-13: qty 12

## 2024-01-13 MED ORDER — METHYLPREDNISOLONE SODIUM SUCC 125 MG IJ SOLR
125.0000 mg | Freq: Once | INTRAMUSCULAR | Status: DC
  Filled 2024-01-13: qty 2

## 2024-01-13 MED ORDER — MAGNESIUM OXIDE -MG SUPPLEMENT 400 (240 MG) MG PO TABS
400.0000 mg | ORAL_TABLET | Freq: Every day | ORAL | Status: DC
  Administered 2024-01-14 – 2024-01-23 (×10): 400 mg via ORAL
  Filled 2024-01-13 (×10): qty 1

## 2024-01-13 MED ORDER — ENOXAPARIN SODIUM 30 MG/0.3ML IJ SOSY: 30.0000 mg | PREFILLED_SYRINGE | INTRAMUSCULAR | Status: DC

## 2024-01-13 MED ORDER — DOXYCYCLINE HYCLATE 100 MG PO TABS
100.0000 mg | ORAL_TABLET | Freq: Two times a day (BID) | ORAL | Status: DC
  Administered 2024-01-14 – 2024-01-18 (×10): 100 mg via ORAL
  Filled 2024-01-13 (×10): qty 1

## 2024-01-13 NOTE — H&P (Addendum)
 History and Physical    Patient: Tracy Park WGN:[id number] DOB: 1956-08-08 DOA: 01/13/2024 DOS: the patient was seen and examined on 01/13/2024 PCP: Chana Comas, FNP  Patient coming from: Home  Chief Complaint:  Chief Complaint  Patient presents with   Shortness of Breath   HPI: Tracy Park is a 68 y.o. female with medical history significant of COPD/emphysema, chronic respiratory failure on 2 L of oxygen , paroxysmal atrial fibrillation on anticoagulation with Eliquis , lung cancer status post left lobectomy, hypertension, hyperlipidemia, hypothyroidism, depression.  She was recently discharged from the hospital on 01/07/2024 After being treated for acute COPD exacerbation.  She had return to the emergency room on 01/10/2024 with similar symptoms of shortness of breath associated with wheezing.  She admitted at that time of not being compliant with her prednisone .  Patient was discharged and advised to resume her prednisone  upon discharge.  Per patient despite being on treatment, her symptoms continue to persist hence decided to come to the emergency room to be further evaluated.  On this visit, stress pills patient was noted to be hypoxic on room air at 82%.  She was referred to hospitalist service for admission and further optimization.  Records were reviewed.  Patient has had multiple admissions over the past few months.  She admitted to cough which is productive of whitish sputum.  She apparently continues to smoke cigarettes daily.  Labs reviewed with unremarkable except for potassium 3.3.  WBC was 13.6, influenza and COVID were negative.  CT angiogram on 05/03 which ruled out pulm embolism.  Chronic changes in both lungs were noted.  Left upper lobectomy changes were noted.  Chest x-ray this morning shows emphysema or edema.  Patient was given IV Solu-Medrol  125 mg.  Review of Systems: As mentioned in the history of present illness. All other systems reviewed and are negative. Past  Medical History:  Diagnosis Date   Anginal pain (HCC)    Anxiety    Bipolar disorder (HCC)    Cancer (HCC)    COPD (chronic obstructive pulmonary disease) (HCC)    Dyspnea    Family history of adverse reaction to anesthesia    History of kidney stones    Hydroureteronephrosis 08/16/2021   Hypothyroidism    Lung cancer (HCC)    Myocardial infarction (HCC)    Paroxysmal atrial fibrillation (HCC)    PTSD (post-traumatic stress disorder)    Sleep apnea    Thyroid  disease    Past Surgical History:  Procedure Laterality Date   ABDOMINAL HYSTERECTOMY     BACK SURGERY     CYSTOSCOPY W/ URETERAL STENT PLACEMENT Right 05/03/2021   Procedure: CYSTOSCOPY WITH RETROGRADE PYELOGRAM/URETERAL STENT PLACEMENT;  Surgeon: Andrez Banker, MD;  Location: [facility];  Service: Urology;  Laterality: Right;   EYE SURGERY     kidney stent     thyroidectomy     Social History:  reports that she has been smoking cigarettes. She has never used smokeless tobacco. She reports that she does not currently use alcohol . She reports that she does not currently use drugs.  Allergies  Allergen Reactions   Red Dye #40 (Allura Red) Hives, Itching and Other (See Comments)    Red food dye   Strawberry Extract Hives and Itching   Tomato Hives and Itching   Aspirin Hives   Tape Rash and Other (See Comments)    Prefers paper tape   Wound Dressing Adhesive Rash    Family History  Problem Relation Age of Onset  Hypertension Mother    Heart failure Mother    Hypertension Father    Diabetes Father    Heart failure Father     Prior to Admission medications   Medication Sig Start Date End Date Taking? Authorizing Provider  acetaminophen  (TYLENOL ) 500 MG tablet Take 500 mg by mouth every 6 (six) hours as needed for moderate pain (pain score 4-6).   Yes [provider]  albuterol  (PROVENTIL ) (2.5 MG/3ML) 0.083% nebulizer solution Take 2.5 mg by nebulization every 4 (four) hours as needed for wheezing or  shortness of breath.   Yes [provider]  albuterol  (VENTOLIN  HFA) 108 (90 Base) MCG/ACT inhaler Inhale 2 puffs into the lungs every 6 (six) hours as needed for wheezing or shortness of breath. 11/07/23  Yes Amin, Ankit C, MD  apixaban  (ELIQUIS ) 5 MG TABS tablet Take 1 tablet (5 mg total) by mouth 2 (two) times daily. 06/06/23  Yes Lorita Rosa, MD  benzonatate  (TESSALON ) 100 MG capsule Take 1 capsule (100 mg total) by mouth 3 (three) times daily as needed for cough. 12/19/23  Yes Magdalene School, MD  Budeson-Glycopyrrol-Formoterol  (BREZTRI  AEROSPHERE) 160-9-4.8 MCG/ACT AERO Inhale 1 puff into the lungs in the morning and at bedtime.   Yes [provider]  gatifloxacin  (ZYMAXID ) 0.5 % SOLN Place 1 drop into the left eye 4 (four) times daily.   Yes [provider]  levothyroxine  (SYNTHROID ) 125 MCG tablet Take 125 mcg by mouth daily before breakfast.   Yes [provider]  losartan  (COZAAR ) 25 MG tablet Take 12.5 mg by mouth daily. 08/29/23  Yes [provider]  magnesium  oxide (MAG-OX) 400 (240 Mg) MG tablet Take 400 mg by mouth in the morning.   Yes [provider]  metoprolol  succinate (TOPROL -XL) 25 MG 24 hr tablet Take 12.5 mg by mouth daily.   Yes [provider]  nitroGLYCERIN  (NITROSTAT ) 0.4 MG SL tablet Place 0.4 mg under the tongue every 5 (five) minutes as needed for chest pain.   Yes [provider]  ondansetron  (ZOFRAN -ODT) 4 MG disintegrating tablet Take 1 tablet (4 mg total) by mouth every 8 (eight) hours as needed for nausea or vomiting. 01/10/24  Yes Tegeler, Marine Sia, MD  oxyCODONE  (ROXICODONE ) 5 MG immediate release tablet Take 1 tablet (5 mg total) by mouth every 6 (six) hours as needed for severe pain (pain score 7-10). 12/03/23  Yes Curatolo, Adam, DO  OXYGEN  Inhale 2 L/min into the lungs continuous.   Yes [provider]  prednisoLONE  acetate (PRED FORTE ) 1 % ophthalmic suspension Place 1 drop  into the left eye 4 (four) times daily. 11/19/23  Yes [provider]  predniSONE  (DELTASONE ) 50 MG tablet Take 1 tablet (50 mg total) by mouth daily with breakfast for 4 days. 01/11/24 01/15/24 Yes Tegeler, Marine Sia, MD  rosuvastatin  (CRESTOR ) 20 MG tablet Take 20 mg by mouth daily.   Yes [provider]  sertraline  (ZOLOFT ) 25 MG tablet Take 25 mg by mouth in the morning.   Yes [provider]  metoprolol  succinate (TOPROL -XL) 25 MG 24 hr tablet Take 0.5 tablets (12.5 mg total) by mouth daily. 06/22/23 01/13/24  Verlyn Goad, MD  oxyCODONE -acetaminophen  (PERCOCET/ROXICET) 5-325 MG tablet Take 1 tablet by mouth every 6 (six) hours as needed for severe pain (pain score 7-10). Patient not taking: Reported on 01/13/2024 01/10/24   Tegeler, Marine Sia, MD  polyvinyl alcohol  (LIQUIFILM TEARS) 1.4 % ophthalmic solution Place 1 drop into both eyes in the  morning, at noon, in the evening, and at bedtime.    [provider]  SYSTANE COMPLETE PF 0.6 % SOLN Place 1 drop into both eyes 4 (four) times daily as needed.    [provider]    Physical Exam: Vitals:   01/13/24 1726 01/13/24 1817 01/13/24 2045 01/13/24 2145  BP:   103/62 127/76  Pulse:   73 75  Resp:   18 (!) 22  Temp:   97.8 F (36.6 C)   TempSrc:   Oral   SpO2:  97% 100% 100%  Weight: 46.5 kg     Height: 5\' 3"  (1.6 m)      General: Patient is a frail appearing female.  Not in any acute distress.  Diminished breath sounds bilaterally. HEENT: Oral mucosa moist. Neck: Supple with no obvious JVD Chest: Diminished breath sounds.  Expiratory wheezes bilaterally. Abdomen: Soft nontender.  Bowel sounds present Extremities without any pedal edema CNS shows no obvious focal deficit.  Data Reviewed: Sodium was 141, potassium 3.3, chloride 102, bicarb 29, glucose 123, BUN 17, creatinine 0.5, WBCs 13.6, hemoglobin 12.5, hematocrit 42, platelet count is 257.  CT scan findings as noted above.  EKG  shows normal sinus rhythm with evidence of right atrial enlargement.  Chest x-ray was negative for any focal consolidation.  Emphysematous changes were noted.  Left upper lobectomy changes noted.  Assessment and Plan:   Patient is 68 year old with history of emphysema/COPD with frequent admissions on account of acute exacerbation.  Patient failed outpatient treatment and being readmitted on account of recurrence of symptoms.  #1 Acute COPD exacerbation: Patient will be optimized with 60 mg IV Solu-Medrol , bronchodilators.  Patient was recently treated with oral azithromycin .  Will treat with p.o. doxycycline .  Antitussives will be ordered.  Continue with supplemental oxygen .  Will anticipate discharging in 1 to 2 days pending improvement of symptoms.  #2.  Chronic respiratory failure on 2 L/min of oxygen .  #3.  Paroxysmal atrial fibrillation on anticoagulation with Eliquis .  Rate controlled on current regimen with Toprol .  Will continue with same.  #4.  Essential hypertension: Patient will be on low-salt diet.  Continue metoprolol  and losartan .  #5.  Hypothyroidism: Continue with Synthroid .   #6.  Chronic tobacco dependence: Tobacco cessation counseling was again reemphasized.  Nicotine  patch will be offered.  She usually declines.  #7.  Hypokalemia,-replete potassium as per protocol.  Check serum magnesium  levels in AM.  #8.  DVT prophylaxis with Eliquis    Advance Care Planning:   Code Status: Limited: Do not attempt resuscitation (DNR) -DNR-LIMITED -Do Not Intubate/DNI    Consults: None.  Outpatient follow-up with pulmonology   Severity of Illness: The appropriate patient status for this patient is INPATIENT. Inpatient status is judged to be reasonable and necessary in order to provide the required intensity of service to ensure the patient's safety. The patient's presenting symptoms, physical exam findings, and initial radiographic and laboratory data in the context of their chronic  comorbidities is felt to place them at high risk for further clinical deterioration. Furthermore, it is not anticipated that the patient will be medically stable for discharge from the hospital within 2 midnights of admission.   * I certify that at the point of admission it is my clinical judgment that the patient will require inpatient hospital care spanning beyond 2 midnights from the point of admission due to high intensity of service, high risk for further deterioration and high frequency of surveillance required.*  Author: Ashantia Amaral K  Adolphe Fortunato, MD 01/13/2024 10:54 PM  For on call review [email or web address].

## 2024-01-13 NOTE — ED Notes (Signed)
Pt @ XRAY

## 2024-01-13 NOTE — ED Provider Notes (Signed)
 [place] EMERGENCY DEPARTMENT AT [facility] Provider Note   CSN: [id number] Arrival date & time: 01/13/24  1652     History  Chief Complaint  Patient presents with   Shortness of Breath    Tracy Park is a 68 y.o. female with a history of COPD on 2 L nasal cannula, chronic smoking, presented to ED with shortness of breath.  Patient was most recently discharged from the hospital about 7 days ago on April 30 after admission for COPD exacerbation.  Subsequently she returned 3 days ago to the ER again for COPD exacerbation and was treated and discharged medications home.  She says she felt better when she went home but is coming back with worsening wheezing and shortness of breath.  She reports that she has had a dry cough which she thinks is atypical for her.  She denies fevers or chills but reports that her sister whom she lives with has had a "really bad flu thing for a week."  CT PE on 01/10/24 with no acute PE, pleural thickening chronic, left upper lobectomy  HPI     Home Medications Prior to Admission medications   Medication Sig Start Date End Date Taking? Authorizing Provider  acetaminophen  (TYLENOL ) 500 MG tablet Take 500 mg by mouth every 6 (six) hours as needed for moderate pain (pain score 4-6).   Yes [provider]  albuterol  (PROVENTIL ) (2.5 MG/3ML) 0.083% nebulizer solution Take 2.5 mg by nebulization every 4 (four) hours as needed for wheezing or shortness of breath.   Yes [provider]  albuterol  (VENTOLIN  HFA) 108 (90 Base) MCG/ACT inhaler Inhale 2 puffs into the lungs every 6 (six) hours as needed for wheezing or shortness of breath. 11/07/23  Yes Amin, Ankit C, MD  apixaban  (ELIQUIS ) 5 MG TABS tablet Take 1 tablet (5 mg total) by mouth 2 (two) times daily. 06/06/23  Yes Lorita Rosa, MD  benzonatate  (TESSALON ) 100 MG capsule Take 1 capsule (100 mg total) by mouth 3 (three) times daily as needed for cough. 12/19/23  Yes Magdalene School, MD  Budeson-Glycopyrrol-Formoterol  (BREZTRI  AEROSPHERE) 160-9-4.8 MCG/ACT AERO Inhale 1 puff into the lungs in the morning and at bedtime.   Yes [provider]  gatifloxacin  (ZYMAXID ) 0.5 % SOLN Place 1 drop into the left eye 4 (four) times daily.   Yes [provider]  levothyroxine  (SYNTHROID ) 125 MCG tablet Take 125 mcg by mouth daily before breakfast.   Yes [provider]  losartan  (COZAAR ) 25 MG tablet Take 12.5 mg by mouth daily. 08/29/23  Yes [provider]  magnesium  oxide (MAG-OX) 400 (240 Mg) MG tablet Take 400 mg by mouth in the morning.   Yes [provider]  metoprolol  succinate (TOPROL -XL) 25 MG 24 hr tablet Take 12.5 mg by mouth daily.   Yes [provider]  nitroGLYCERIN  (NITROSTAT ) 0.4 MG SL tablet Place 0.4 mg under the tongue every 5 (five) minutes as needed for chest pain.   Yes [provider]  ondansetron  (ZOFRAN -ODT) 4 MG disintegrating tablet Take 1 tablet (4 mg total) by mouth every 8 (eight) hours as needed for nausea or vomiting. 01/10/24  Yes Tegeler, Marine Sia, MD  oxyCODONE  (ROXICODONE ) 5 MG immediate release tablet Take 1 tablet (5 mg total) by mouth every 6 (six) hours as needed for severe pain (pain score 7-10). 12/03/23  Yes Curatolo, Adam, DO  OXYGEN  Inhale 2 L/min into the lungs continuous.   Yes [provider]  polyvinyl  alcohol  (LIQUIFILM TEARS) 1.4 % ophthalmic solution Place 1 drop into both eyes in the morning, at noon, in the evening, and at bedtime.   Yes [provider]  prednisoLONE  acetate (PRED FORTE ) 1 % ophthalmic suspension Place 1 drop into the left eye 4 (four) times daily. 11/19/23  Yes [provider]  predniSONE  (DELTASONE ) 50 MG tablet Take 1 tablet (50 mg total) by mouth daily with breakfast for 4 days. 01/11/24 01/15/24 Yes Tegeler, Marine Sia, MD  rosuvastatin  (CRESTOR ) 20 MG tablet Take 20 mg by mouth daily.   Yes [provider]   sertraline  (ZOLOFT ) 25 MG tablet Take 25 mg by mouth in the morning.   Yes [provider]  SYSTANE COMPLETE PF 0.6 % SOLN Place 1 drop into both eyes 4 (four) times daily as needed.   Yes [provider]  metoprolol  succinate (TOPROL -XL) 25 MG 24 hr tablet Take 0.5 tablets (12.5 mg total) by mouth daily. Patient not taking: Reported on 01/13/2024 06/22/23 01/13/24  Verlyn Goad, MD  oxyCODONE -acetaminophen  (PERCOCET/ROXICET) 5-325 MG tablet Take 1 tablet by mouth every 6 (six) hours as needed for severe pain (pain score 7-10). Patient not taking: Reported on 01/13/2024 01/10/24   Tegeler, Marine Sia, MD      Allergies    Red dye #40 (allura red), Strawberry extract, Tomato, Aspirin, Tape, and Wound dressing adhesive    Review of Systems   Review of Systems  Physical Exam Updated Vital Signs BP 112/76 (BP Location: Right Arm)   Pulse 80   Temp 97.9 F (36.6 C) (Oral)   Resp 20   Ht 5\' 3"  (1.6 m)   Wt 46.5 kg   SpO2 100%   BMI 18.17 kg/m  Physical Exam Constitutional:      General: She is not in acute distress. HENT:     Head: Normocephalic and atraumatic.  Eyes:     Conjunctiva/sclera: Conjunctivae normal.     Pupils: Pupils are equal, round, and reactive to light.  Cardiovascular:     Rate and Rhythm: Normal rate and regular rhythm.  Pulmonary:     Effort: Pulmonary effort is normal. No respiratory distress.     Breath sounds: Wheezing present.     Comments: 90% on 2L [place] Abdominal:     General: There is no distension.     Tenderness: There is no abdominal tenderness.  Skin:    General: Skin is warm and dry.  Neurological:     General: No focal deficit present.     Mental Status: She is alert. Mental status is at baseline.  Psychiatric:        Mood and Affect: Mood normal.        Behavior: Behavior normal.     ED Results / Procedures / Treatments   Labs (all labs ordered are listed, but only abnormal results are displayed) Labs Reviewed   BASIC METABOLIC PANEL WITH GFR - Abnormal; Notable for the following components:      Result Value   Potassium 3.3 (*)    Glucose, Bld 123 (*)    Calcium  8.1 (*)    All other components within normal limits  CBC WITH DIFFERENTIAL/PLATELET - Abnormal; Notable for the following components:   WBC 13.6 (*)    MCHC 29.7 (*)    Neutro Abs 11.1 (*)    Abs Immature Granulocytes 0.08 (*)    All other components within normal limits  RESP PANEL BY RT-PCR (RSV, FLU A&B, COVID)  RVPGX2  HIV ANTIBODY (ROUTINE TESTING W REFLEX)  BASIC METABOLIC PANEL WITH GFR  CBC    EKG EKG Interpretation Date/Time:  Tuesday Jan 13 2024 17:51:15 EDT Ventricular Rate:  85 PR Interval:  130 QRS Duration:  105 QT Interval:  389 QTC Calculation: 463 R Axis:   -36  Text Interpretation: Sinus rhythm Right atrial enlargement RSR' in V1 or V2, probably normal variant Left ventricular hypertrophy Confirmed by Jerald Molly [phone]) on 01/13/2024 11:52:26 PM  Radiology DG Chest 2 View Result Date: 01/13/2024 CLINICAL DATA:  hx of COPD reporting productive cough EXAM: CHEST - 2 VIEW COMPARISON:  None available. FINDINGS: Emphysema. Persistent pleural thickening and patchy airspace opacities in the left upper lung zone, likely scarring. No new airspace consolidation or pneumothorax. Blunting of both costophrenic sulci. No cardiomegaly. No acute fracture or destructive lesion. IMPRESSION: 1. Emphysema.  No pneumonia or pulmonary edema. 2. Chronic left upper lung zone scarring and pleural thickening. Electronically Signed   By: Rance Burrows M.D.   On: 01/13/2024 17:50    Procedures Procedures    Medications Ordered in ED Medications  albuterol  (PROVENTIL ) (2.5 MG/3ML) 0.083% nebulizer solution (0 mg/hr Nebulization Stopped 01/13/24 2200)  methylPREDNISolone  sodium succinate (SOLU-MEDROL ) 125 mg/2 mL injection 125 mg (125 mg Intravenous Not Given 01/13/24 1718)  methylPREDNISolone  sodium succinate (SOLU-MEDROL ) 125  mg/2 mL injection 90 mg (has no administration in time range)    Followed by  predniSONE  (DELTASONE ) tablet 40 mg (has no administration in time range)  acetaminophen  (TYLENOL ) tablet 500 mg (has no administration in time range)  oxyCODONE  (Oxy IR/ROXICODONE ) immediate release tablet 5 mg (has no administration in time range)  metoprolol  succinate (TOPROL -XL) 24 hr tablet 12.5 mg (has no administration in time range)  nitroGLYCERIN  (NITROSTAT ) SL tablet 0.4 mg (has no administration in time range)  rosuvastatin  (CRESTOR ) tablet 20 mg (has no administration in time range)  sertraline  (ZOLOFT ) tablet 25 mg (has no administration in time range)  levothyroxine  (SYNTHROID ) tablet 125 mcg (has no administration in time range)  ondansetron  (ZOFRAN -ODT) disintegrating tablet 4 mg (has no administration in time range)  apixaban  (ELIQUIS ) tablet 5 mg (has no administration in time range)  magnesium  oxide (MAG-OX) tablet 400 mg (has no administration in time range)  albuterol  (PROVENTIL ) (2.5 MG/3ML) 0.083% nebulizer solution 2.5 mg (has no administration in time range)  benzonatate  (TESSALON ) capsule 100 mg (has no administration in time range)  budeson-glycopyrrolate -formoterol  (BREZTRI ) 160-9-4.8 MCG/ACT inhaler 1 puff (has no administration in time range)  gatifloxacin  (ZYMAXID ) 0.5 % ophthalmic drops 1 drop (has no administration in time range)  polyvinyl alcohol  (LIQUIFILM TEARS) 1.4 % ophthalmic solution 1 drop (has no administration in time range)  doxycycline  (VIBRA -TABS) tablet 100 mg (has no administration in time range)  potassium chloride  SA (KLOR-CON  M) CR tablet 20 mEq (has no administration in time range)  magnesium  sulfate IVPB 2 g 50 mL (0 g Intravenous Stopped 01/13/24 2143)  ipratropium-albuterol  (DUONEB) 0.5-2.5 (3) MG/3ML nebulizer solution 3 mL (3 mLs Nebulization Given 01/13/24 2311)    ED Course/ Medical Decision Making/ A&P Clinical Course as of 01/13/24 2352  Tue Jan 13, 2024   2212 No improvement of wheezing on re-exam, still feels poorly, will admit for COPD exacerbation [MT]  2230 Admitted to hospitalist [MT]    Clinical Course User Index [MT] Borden Thune, Janalyn Me, MD  Medical Decision Making Amount and/or Complexity of Data Reviewed Labs: ordered. Radiology: ordered.  Risk Prescription drug management. Decision regarding hospitalization.   This patient presents to the ED with concern for shortness of breath. This involves an extensive number of treatment options, and is a complaint that carries with it a high risk of complications and morbidity.  The differential diagnosis includes COPD exacerbation versus anemia of his pleural effusion versus pneumonia versus other  Co-morbidities that complicate the patient evaluation: smoking, COPD  I ordered and personally interpreted labs.  The pertinent results include:  WBC elevated likely from steroids; flu covid neg  I ordered imaging studies including dg chest I independently visualized and interpreted imaging which showed no acute abnormalities I agree with the radiologist interpretation  The patient was maintained on a cardiac monitor.  I personally viewed and interpreted the cardiac monitored which showed an underlying rhythm of: Sinus rhythm  Per my interpretation the patient's ECG shows no acute ischemic findings  I ordered medication including continuous nebulizers, IV steroids and IV magnesium  for COPD  I have reviewed the patients home medicines and have made adjustments as needed  Test Considered: Low suspicion for acute PE clinically  After the interventions noted above, I reevaluated the patient and found that they have: stayed the same   Dispostion:  After consideration of the diagnostic results and the patients response to treatment, I feel that the patent would benefit from medical admission         Final Clinical Impression(s) / ED  Diagnoses Final diagnoses:  COPD exacerbation [facility])    Rx / DC Orders ED Discharge Orders     None         Giselle Brutus, Janalyn Me, MD 01/13/24 2352

## 2024-01-13 NOTE — ED Triage Notes (Signed)
 BIB EMS from home for sob, pt wears 2L [place] at baseline, 92% on 2L. EMS reports pt had tachypnea with retractions. EMS administered duonebs and solumedrol

## 2024-01-14 DIAGNOSIS — J441 Chronic obstructive pulmonary disease with (acute) exacerbation: Secondary | ICD-10-CM | POA: Diagnosis not present

## 2024-01-14 LAB — BASIC METABOLIC PANEL WITH GFR
Anion gap: 8 (ref 5–15)
BUN: 16 mg/dL (ref 8–23)
CO2: 30 mmol/L (ref 22–32)
Calcium: 8.5 mg/dL — ABNORMAL LOW (ref 8.9–10.3)
Chloride: 97 mmol/L — ABNORMAL LOW (ref 98–111)
Creatinine, Ser: 0.51 mg/dL (ref 0.44–1.00)
GFR, Estimated: >60 mL/min (ref >60)
Glucose, Bld: 159 mg/dL — ABNORMAL HIGH (ref 70–99)
Potassium: 4.4 mmol/L (ref 3.5–5.1)
Sodium: 135 mmol/L (ref 135–145)

## 2024-01-14 LAB — CBC
HCT: 40.1 % (ref 36.0–46.0)
Hemoglobin: 12.1 g/dL (ref 12.0–15.0)
MCH: 29.3 pg (ref 26.0–34.0)
MCHC: 30.2 g/dL (ref 30.0–36.0)
MCV: 97.1 fL (ref 80.0–100.0)
Platelets: 258 10*3/uL (ref 150–400)
RBC: 4.13 MIL/uL (ref 3.87–5.11)
RDW: 14.1 % (ref 11.5–15.5)
WBC: 8 10*3/uL (ref 4.0–10.5)
nRBC: 0 % (ref 0.0–0.2)

## 2024-01-14 LAB — PROCALCITONIN: Procalcitonin: <0.1 ng/mL

## 2024-01-14 LAB — HIV ANTIBODY (ROUTINE TESTING W REFLEX): HIV Screen 4th Generation wRfx: NONREACTIVE

## 2024-01-14 MED ORDER — POLYVINYL ALCOHOL 1.4 % OP SOLN
1.0000 [drp] | Freq: Four times a day (QID) | OPHTHALMIC | Status: DC
  Administered 2024-01-14 – 2024-01-23 (×34): 1 [drp] via OPHTHALMIC
  Filled 2024-01-14: qty 15

## 2024-01-14 MED ORDER — IPRATROPIUM-ALBUTEROL 0.5-2.5 (3) MG/3ML IN SOLN
3.0000 mL | Freq: Three times a day (TID) | RESPIRATORY_TRACT | Status: DC
  Administered 2024-01-14 – 2024-01-19 (×14): 3 mL via RESPIRATORY_TRACT
  Filled 2024-01-14 (×14): qty 3

## 2024-01-14 MED ORDER — IPRATROPIUM-ALBUTEROL 0.5-2.5 (3) MG/3ML IN SOLN: 3.0000 mL | RESPIRATORY_TRACT | Status: DC | PRN

## 2024-01-14 MED ORDER — ALBUTEROL SULFATE (2.5 MG/3ML) 0.083% IN NEBU
2.5000 mg | INHALATION_SOLUTION | Freq: Three times a day (TID) | RESPIRATORY_TRACT | Status: DC
  Administered 2024-01-14: 2.5 mg via RESPIRATORY_TRACT
  Filled 2024-01-14: qty 3

## 2024-01-14 MED ORDER — GLUCAGON HCL RDNA (DIAGNOSTIC) 1 MG IJ SOLR
1.0000 mg | INTRAMUSCULAR | Status: DC | PRN
Start: 2024-01-14 — End: 2024-01-23

## 2024-01-14 MED ORDER — METHYLPREDNISOLONE SODIUM SUCC 40 MG IJ SOLR
40.0000 mg | Freq: Two times a day (BID) | INTRAMUSCULAR | Status: DC
  Administered 2024-01-14 – 2024-01-19 (×10): 40 mg via INTRAVENOUS
  Filled 2024-01-14 (×10): qty 1

## 2024-01-14 MED ORDER — SENNOSIDES-DOCUSATE SODIUM 8.6-50 MG PO TABS: 1.0000 | ORAL_TABLET | Freq: Every evening | ORAL | Status: DC | PRN

## 2024-01-14 MED ORDER — METOPROLOL TARTRATE 5 MG/5ML IV SOLN: 5.0000 mg | INTRAVENOUS | Status: DC | PRN

## 2024-01-14 MED ORDER — BUDESONIDE 0.5 MG/2ML IN SUSP
0.5000 mg | Freq: Two times a day (BID) | RESPIRATORY_TRACT | Status: DC
  Administered 2024-01-14 – 2024-01-23 (×18): 0.5 mg via RESPIRATORY_TRACT
  Filled 2024-01-14 (×18): qty 2

## 2024-01-14 MED ORDER — ENSURE ENLIVE PO LIQD
237.0000 mL | Freq: Three times a day (TID) | ORAL | Status: DC
  Administered 2024-01-14 – 2024-01-22 (×21): 237 mL via ORAL

## 2024-01-14 MED ORDER — HYDRALAZINE HCL 20 MG/ML IJ SOLN: 10.0000 mg | INTRAMUSCULAR | Status: DC | PRN

## 2024-01-14 NOTE — Progress Notes (Signed)
 Initial Nutrition Assessment  DOCUMENTATION CODES:   Non-severe (moderate) malnutrition in context of chronic illness  INTERVENTION:  - Regular diet.  - Ensure Plus High Protein po BID, each supplement provides 350 kcal and 20 grams of protein. - Encourage intake at all meals and of supplements. - Monitor weight trends.   NUTRITION DIAGNOSIS:   Moderate Malnutrition related to chronic illness (COPD) as evidenced by mild fat depletion, severe muscle depletion.  GOAL:   Patient will meet greater than or equal to 90% of their needs  MONITOR:   PO intake, Supplement acceptance, Weight trends  REASON FOR ASSESSMENT:   Consult Assessment of nutrition requirement/status  ASSESSMENT:   68 y.o. female with PMH significant of COPD/emphysema, chronic respiratory failure on 2 L of oxygen , lung cancer s/p left lobectomy, HTN, HLD, hypothyroidism, depression who was recently discharged form the hospital for acute COPD exacerbation but was noncompliant with medication and presented back to the hospital and admitted for acute COPD exacerbation.   Patient reports a UBW of  105# and weight loss within the past 6 months due to "not eating properly". Per EMR, weight with no significant changes over the past 2 months. Patient endorses eating "what she can afford" at home. States she has been eating 3 meals a day plus a snack. Previously has consumed Ensure at home but not recently.  Current appetite fair, patient waiting on lunch at time of visit. Encouraged intake of 3 meals a day. She is agreeable to receive ensure TID to support intake.   Medications reviewed and include: -  Labs reviewed:  -   NUTRITION - FOCUSED PHYSICAL EXAM:  Flowsheet Row Most Recent Value  Orbital Region Mild depletion  Upper Arm Region Moderate depletion  Thoracic and Lumbar Region Mild depletion  Buccal Region No depletion  Temple Region Moderate depletion  Clavicle Bone Region Severe depletion  Clavicle  and Acromion Bone Region Severe depletion  Scapular Bone Region Unable to assess  Dorsal Hand Moderate depletion  [reported left hand weakness due to reconstructive surgery]  Patellar Region Severe depletion  Anterior Thigh Region Severe depletion  Posterior Calf Region Severe depletion  Edema (RD Assessment) None  Hair Reviewed  Eyes Reviewed  [reports 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 Yes; Fluid consistency: Thin  Diet effective now                   EDUCATION NEEDS:  Education needs have been addressed  Skin:  Skin Assessment: Reviewed RN Assessment  Last BM:  unknown  Height:  Ht Readings from Last 1 Encounters:  01/13/24 5\' 3"  (1.6 m)   Weight:  Wt Readings from Last 1 Encounters:  01/13/24 46.5 kg    BMI:  Body mass index is 18.17 kg/m.  Estimated Nutritional Needs:  Kcal:  1500-1650 kcals Protein:  70-85 grams Fluid:  >/= 1.5L    Scheryl Cushing RD, LDN Contact via Secure Chat.

## 2024-01-14 NOTE — Progress Notes (Signed)
 OT Cancellation Note  Patient Details Name: Tracy Park MRN: [id number] DOB: 1956/06/06   Cancelled Treatment:    Reason Eval/Treat Not Completed: OT screened, no needs identified, will sign off Patient reported she did not need therapy and that she was independent at this time. OT to sign off per patient request.  [name], MS Acute Rehabilitation Department Office# [phone]  01/14/2024, 10:29 AM

## 2024-01-14 NOTE — Hospital Course (Addendum)
 Brief Narrative:  75 with history of COPD/emphysema, chronic hypoxia 2 L nasal cannula, paroxysmal A-fib on Eliquis , lung cancer status post left lobectomy, HTN, HLD, hypothyroidism, depression recently discharged on 4/30 after being treated for COPD exacerbation now presents back to the hospital with shortness of breath and wheezing.  Upon admission CT angio was negative for PE but shows chronic lung changes.  Chest x-ray shows emphysema, started on steroids and bronchodilators.  Per pulmonary transition to p.o. prednisone  tomorrow   Assessment & Plan:  Principal Problem:   COPD with acute exacerbation and acute on chronic respiratory failure with hypoxia   Acute respiratory distress Acute COPD exacerbation Chronic hypoxia 2 L nasal cannula - Steroids, bronchodilators, I-S/flutter valve.  Wheezing present but improved. Sloe to improve, will discuss with Pulm to see if we need to alter any management.  Hypertonic saline.  Per pulmonary transition to p.o. prednisone  tomorrow - BNP and procalcitonin are normal, respiratory panel negative - Currently on Levaquin , prior history of Pseudomonas  Paroxysmal atrial fibrillation - Continue Toprol  and Eliquis .  IV as needed  Essential hypertension - Metoprolol  and losartan .  IV as needed  Hypothyroidism - Synthroid   Chronic tobacco use - Counseled to quit using this.  Nicotine  as needed  Hypokalemia - As needed repletion   DVT prophylaxis: SCDs Start: 01/13/24 2248 apixaban  (ELIQUIS ) tablet 5 mg      Code Status: Limited: Do not attempt resuscitation (DNR) -DNR-LIMITED -Do Not Intubate/DNI  Family Communication:   Status is: Inpatient Discharge when cleared by pulmonary.  Pulmonary planning on transitioning to p.o. tomorrow   Subjective: Doing better no complaints  Examination:  General exam: Appears calm and comfortable  Respiratory system: Bilateral diffuse expiratory wheezing Cardiovascular system: S1 & S2 heard, RRR. No JVD,  murmurs, rubs, gallops or clicks. No pedal edema. Gastrointestinal system: Abdomen is nondistended, soft and nontender. No organomegaly or masses felt. Normal bowel sounds heard. Central nervous system: Alert and oriented. No focal neurological deficits. Extremities: Symmetric 5 x 5 power. Skin: No rashes, lesions or ulcers Psychiatry: Judgement and insight appear normal. Mood & affect appropriate.

## 2024-01-14 NOTE — TOC Initial Note (Signed)
 Transition of Care [facility]) - Initial/Assessment Note    Patient Details  Name: Tracy Park MRN: [id number] Date of Birth: 1956/03/31  [facility]) CM/SW Contact:    Kathryn Parish, RN Phone Number: 01/14/2024, 2:11 PM  Clinical Narrative:                 TOC following patient admitted with high risk for readmission.PTA from home were she lives with grandson's Aunt; she normally functions independently.  Patient states that she does have PCP Alyn Babe, [place], FNP), follows up on a regular basis. Verified insurance. DME walker, nebulizer, home O2 with [employer]. Transportation home at discharge will be daughter or medical transportation [facility]). TOC will follow for oxygen  requirement change.   Barriers to Discharge: Continued Medical Work up   Patient Goals and CMS Choice            Expected Discharge Plan and Services         Expected Discharge Date: 01/07/24                                    Prior Living Arrangements/Services                       Activities of Daily Living   ADL Screening (condition at time of admission) Independently performs ADLs?: Yes (appropriate for developmental age) Is the patient deaf or have difficulty hearing?: No Does the patient have difficulty seeing, even when wearing glasses/contacts?: No Does the patient have difficulty concentrating, remembering, or making decisions?: No  Permission Sought/Granted                  Emotional Assessment              Admission diagnosis:  COPD exacerbation (HCC) [J44.1] COPD with acute exacerbation (HCC) [J44.1] Patient Active Problem List   Diagnosis Date Noted   Chronic respiratory failure with hypoxia (HCC) 12/13/2023   COPD with acute exacerbation and acute on chronic respiratory failure with hypoxia 09/22/2023   History of depression 08/03/2023   Left-sided chest pain 08/03/2023   Rib pain on left side 06/24/2023   Food insecurity 06/05/2023   Left foot  pain 04/12/2023   Acute exacerbation of chronic obstructive pulmonary disease (COPD) (HCC) 03/06/2023   Hypocalcemia 03/06/2023   Generalized anxiety disorder 03/06/2023   Paroxysmal atrial fibrillation (HCC) 02/23/2023   Paroxysmal atrial fibrillation with RVR (HCC) 01/25/2023   Pulmonary nodule 01/25/2023   COPD (chronic obstructive pulmonary disease) (HCC) 01/25/2023   Essential hypertension 01/25/2023   Thrombocytosis 03/26/2022   COPD exacerbation (HCC) 03/25/2022   DDD (degenerative disc disease), cervical 03/24/2022   Bipolar disorder (HCC) 03/24/2022   Septic shock (HCC) 03/14/2022   Sepsis due to pneumonia (HCC) 03/13/2022   Dyslipidemia 03/13/2022   Anxiety and depression and bipolar 03/13/2022   History of lung cancer 03/13/2022   Tobacco use 03/13/2022   Pneumonia 02/27/2022   Adenocarcinoma of lung (HCC) 02/26/2022   Leukocytosis 02/26/2022   Acute on chronic respiratory failure with hypoxia (HCC) 02/26/2022   Normocytic anemia 02/08/2022   Protein-calorie malnutrition, severe 08/25/2021   Influenza A with pneumonia 08/16/2021   Sepsis (HCC) 08/16/2021   Hyponatremia 08/16/2021   Hypokalemia 08/16/2021   Hypomagnesemia 08/16/2021   Hydronephrosis 05/03/2021   Acute encephalopathy 05/02/2021   CAP (community acquired pneumonia) 04/10/2020   Coronary artery disease involving native coronary artery  of native heart without angina pectoris 08/18/2018   Malignant neoplasm of upper lobe of left lung (HCC) 11/12/2016   Chronic bilateral low back pain without sciatica 08/21/2016   Obstructive sleep apnea (adult) (pediatric) 02/22/2015   Hyperlipidemia 06/03/2013   Vitamin D  deficiency 06/03/2013   Hypothyroidism 04/28/2012   Nicotine  abuse 04/28/2012   Chest pain, atypical 04/28/2012   PCP:  Chana Comas, FNP Pharmacy:   [facility] [phone] Jonette Nestle, [place] - [address] [place] [address] Phone: [phone]  Fax: [phone]  Melodee Spruce [facility] - [facility] [address] [place] [address] Phone: [phone] Fax: [phone]     Social Drivers of Health (SDOH) Social History: SDOH Screenings   Food Insecurity: No Food Insecurity (01/14/2024)  Housing: Low Risk  (01/14/2024)  Transportation Needs: No Transportation Needs (01/14/2024)  Utilities: Not At Risk (01/14/2024)  Financial Resource Strain: High Risk (10/08/2023)   Received from [facility]  Physical Activity: Unknown (04/07/2023)   Received from [facility]  Social Connections: Moderately Integrated (01/14/2024)  Recent Concern: Social Connections - Socially Isolated (12/30/2023)  Stress: Stress Concern Present (04/07/2023)   Received from [facility]  Tobacco Use: High Risk (01/13/2024)   SDOH Interventions:     Readmission Risk Interventions    01/05/2024   12:08 PM 12/30/2023   11:48 AM 11/07/2023    8:20 AM  Readmission Risk Prevention Plan  Transportation Screening Complete Complete Complete  Medication Review Oceanographer) Complete Complete Complete  PCP or Specialist appointment within 3-5 days of discharge Complete Complete Complete  HRI or Home Care Consult Complete Complete Complete  SW Recovery Care/Counseling Consult Complete Complete Complete  Palliative Care Screening Not Applicable Not Applicable Not Applicable  Skilled Nursing Facility Not Applicable Not Applicable Not Applicable

## 2024-01-14 NOTE — Evaluation (Signed)
 Physical Therapy Evaluation Patient Details Name: Tracy Park MRN: [id number] DOB: 25-May-1956 Today's Date: 01/14/2024  History of Present Illness  68 y.o. female admitted 01/13/2024 with symptoms of shortness of breath associated with wheezing. Dx of COPD exacerbation. Pt with medical history significant of COPD/emphysema, chronic respiratory failure on 2 L of oxygen , paroxysmal atrial fibrillation on anticoagulation with Eliquis , lung cancer status post left lobectomy, hypertension, hyperlipidemia, hypothyroidism, depression.  She was recently discharged from the hospital on 01/07/2024 After being treated for acute COPD exacerbation. Presented to ED 01/10/24 with wheezing.  Clinical Impression  Pt ambulated 91' without an assistive device, no loss of balance, SpO2 98% on 2L O2 walking, distance limited by 3/4 dyspnea.  Pt reports she's at her baseline in terms of activity tolerance, she only walks short distances in the home and uses her rollator on the rare occasion that she goes out of the home. Pt reports she uses 2L O2 at home. She is mobilizing at her baseline, PT signing off, mobility team to follow.       If plan is discharge home, recommend the following: Assistance with cooking/housework;Assist for transportation   Can travel by private vehicle        Equipment Recommendations None recommended by PT  Recommendations for Other Services       Functional Status Assessment Patient has not had a recent decline in their functional status     Precautions / Restrictions Precautions Precautions: Fall;Other (comment) Recall of Precautions/Restrictions: Intact Precaution/Restrictions Comments: on 2L O2 baseline; denies falls in past 6 months Restrictions Weight Bearing Restrictions Per Provider Order: No      Mobility  Bed Mobility Overal bed mobility: Modified Independent             General bed mobility comments: HOB up, used rail    Transfers Overall transfer  level: Independent                      Ambulation/Gait Ambulation/Gait assistance: Independent Gait Distance (Feet): 65 Feet Assistive device: None Gait Pattern/deviations: Decreased stride length, Step-through pattern Gait velocity: decr     General Gait Details: steady, no loss of balance, SpO2 98% on room air walking, HR 90s, distance limited by 3/4 dyspnea, wheezing noted, notifed RN of pt request for a breathing tx  Stairs            Wheelchair Mobility     Tilt Bed    Modified Rankin (Stroke Patients Only)       Balance Overall balance assessment: Independent                                           Pertinent Vitals/Pain Pain Assessment Pain Assessment: 0-10 Pain Score: 2  Pain Location: chest Pain Descriptors / Indicators: Tightness Pain Intervention(s): Limited activity within patient's tolerance, Monitored during session, Repositioned    Home Living Family/patient expects to be discharged to:: Private residence Living Arrangements: Other relatives Available Help at Discharge: Family;Available PRN/intermittently Type of Home: Mobile home Home Access: Stairs to enter Entrance Stairs-Rails: Right;Left;Can reach both Entrance Stairs-Number of Steps: 3   Home Layout: One level Home Equipment: Cane - single point;Rollator (4 wheels);Shower seat;Grab bars - tub/shower;Hand held shower head Additional Comments: On 2 L O2 at home 24/7    Prior Function Prior Level of Function : Independent/Modified Independent  Mobility Comments: In home ambulated without AD; in community uses rollator ADLs Comments: Ind with ADLs/selfcare, IADLs, home mgt, cooking, no longer drives; can go out in community but typically family does shopping     Extremity/Trunk Assessment   Upper Extremity Assessment Upper Extremity Assessment: Overall WFL for tasks assessed    Lower Extremity Assessment Lower Extremity Assessment:  Overall WFL for tasks assessed    Cervical / Trunk Assessment Cervical / Trunk Assessment: Kyphotic  Communication   Communication Communication: No apparent difficulties    Cognition Arousal: Alert Behavior During Therapy: WFL for tasks assessed/performed   PT - Cognitive impairments: No apparent impairments                                 Cueing       General Comments      Exercises     Assessment/Plan    PT Assessment Patient does not need any further PT services  PT Problem List Decreased activity tolerance       PT Treatment Interventions      PT Goals (Current goals can be found in the Care Plan section)  Acute Rehab PT Goals PT Goal Formulation: All assessment and education complete, DC therapy    Frequency       Co-evaluation               AM-PAC PT "6 Clicks" Mobility  Outcome Measure Help needed turning from your back to your side while in a flat bed without using bedrails?: None Help needed moving from lying on your back to sitting on the side of a flat bed without using bedrails?: None Help needed moving to and from a bed to a chair (including a wheelchair)?: None Help needed standing up from a chair using your arms (e.g., wheelchair or bedside chair)?: None Help needed to walk in hospital room?: None Help needed climbing 3-5 steps with a railing? : None 6 Click Score: 24    End of Session Equipment Utilized During Treatment: Oxygen  Activity Tolerance: Patient limited by fatigue Patient left: in bed;with call bell/phone within reach;with nursing/sitter in room Nurse Communication: Mobility status;Other (comment) (pt requests breathing tx)      Time: 1610-9604 PT Time Calculation (min) (ACUTE ONLY): 11 min   Charges:   PT Evaluation $PT Eval Low Complexity: 1 Low   PT General Charges $$ ACUTE PT VISIT: 1 Visit         Daymon Evans PT 01/14/2024  Acute Rehabilitation Services  Office  [phone]

## 2024-01-14 NOTE — Progress Notes (Signed)
 PROGRESS NOTE    Tracy Park  YQM:[id number] DOB: 09/05/1956 DOA: 01/13/2024 PCP: Chana Comas, FNP    Brief Narrative:  89 with history of COPD/emphysema, chronic hypoxia 2 L nasal cannula, paroxysmal A-fib on Eliquis , lung cancer status post left lobectomy, HTN, HLD, hypothyroidism, depression recently discharged on 4/30 after being treated for COPD exacerbation now presents back to the hospital with shortness of breath and wheezing.  Upon admission CT angio was negative for PE but shows chronic lung changes.  Chest x-ray shows emphysema, started on steroids and bronchodilators   Assessment & Plan:  Principal Problem:   COPD with acute exacerbation and acute on chronic respiratory failure with hypoxia   Acute respiratory distress Acute COPD exacerbation Chronic hypoxia 2 L nasal cannula - Steroids, bronchodilators, I-S/flutter valve.  Continues to have diffuse wheezing - Check BNP, procalcitonin -Recently completed azithromycin .  Paroxysmal atrial fibrillation - Continue Toprol  and Eliquis .  IV as needed  Essential hypertension - Metoprolol  and losartan .  IV as needed  Hypothyroidism - Synthroid   Chronic tobacco use - Counseled to quit using this.  Nicotine  as needed  Hypokalemia - As needed repletion   DVT prophylaxis: SCDs Start: 01/13/24 2248 apixaban  (ELIQUIS ) tablet 5 mg      Code Status: Limited: Do not attempt resuscitation (DNR) -DNR-LIMITED -Do Not Intubate/DNI  Family Communication:   Status is: Inpatient Significant abnormal breath sounds.  Continue hospital stay    Subjective: Still having quite a bit of wheezing and exertional dyspnea   Examination:  General exam: Appears calm and comfortable  Respiratory system: Bilateral diffuse expiratory wheezing Cardiovascular system: S1 & S2 heard, RRR. No JVD, murmurs, rubs, gallops or clicks. No pedal edema. Gastrointestinal system: Abdomen is nondistended, soft and nontender. No organomegaly or  masses felt. Normal bowel sounds heard. Central nervous system: Alert and oriented. No focal neurological deficits. Extremities: Symmetric 5 x 5 power. Skin: No rashes, lesions or ulcers Psychiatry: Judgement and insight appear normal. Mood & affect appropriate.                Diet Orders (From admission, onward)     Start     Ordered   01/13/24 2250  Diet 2 gram sodium Room service appropriate? Yes; Fluid consistency: Thin  Diet effective now       Question Answer Comment  Room service appropriate? Yes   Fluid consistency: Thin      01/13/24 2251            Objective: Vitals:   01/14/24 0800 01/14/24 0912 01/14/24 1200 01/14/24 1424  BP: 105/87  103/69   Pulse: 62  72   Resp: 17  18   Temp: 98.4 F (36.9 C)  98.1 F (36.7 C)   TempSrc: Oral  Oral   SpO2: 99% 98% 98% 98%  Weight:      Height:        Intake/Output Summary (Last 24 hours) at 01/14/2024 1503 Last data filed at 01/14/2024 0500 Gross per 24 hour  Intake 530 ml  Output --  Net 530 ml   Filed Weights   01/13/24 1726  Weight: 46.5 kg    Scheduled Meds:  albuterol   2.5 mg Nebulization TID   apixaban   5 mg Oral BID   budeson-glycopyrrolate -formoterol   1 puff Inhalation BID   doxycycline   100 mg Oral Q12H   feeding supplement  237 mL Oral TID BM   gatifloxacin   1 drop Left Eye QID   levothyroxine   125 mcg Oral  [id number]   magnesium  oxide  400 mg Oral Daily   methylPREDNISolone  (SOLU-MEDROL ) injection  40 mg Intravenous Q12H   metoprolol  succinate  12.5 mg Oral Daily   rosuvastatin   20 mg Oral Daily   sertraline   25 mg Oral Daily   Continuous Infusions:  albuterol  Stopped (01/13/24 2200)    Nutritional status Signs/Symptoms: mild fat depletion, severe muscle depletion Interventions: Refer to RD note for recommendations, Ensure Enlive (each supplement provides 350kcal and 20 grams of protein), MVI Body mass index is 18.17 kg/m.  Data Reviewed:   CBC: Recent Labs  Lab 01/10/24 1744  01/13/24 1755 01/14/24 0450  WBC 15.1* 13.6* 8.0  NEUTROABS 10.6* 11.1*  --   HGB 12.6 12.5 12.1  HCT 40.5 42.1 40.1  MCV 94.4 97.5 97.1  PLT 252 257 258   Basic Metabolic Panel: Recent Labs  Lab 01/10/24 1744 01/13/24 1755 01/14/24 0450  NA 141 141 135  K 3.7 3.3* 4.4  CL 102 102 97*  CO2 29 29 30   GLUCOSE 113* 123* 159*  BUN 15 17 16   CREATININE 0.47 0.50 0.51  CALCIUM  9.0 8.1* 8.5*   GFR: Estimated Creatinine Clearance: 51.5 mL/min (by C-G formula based on SCr of 0.51 mg/dL). Liver Function Tests: Recent Labs  Lab 01/10/24 1744  AST 16  ALT 16  ALKPHOS 53  BILITOT 0.4  PROT 6.1*  ALBUMIN 3.3*   Recent Labs  Lab 01/10/24 1744  LIPASE 30   No results for input(s): "AMMONIA" in the last 168 hours. Coagulation Profile: No results for input(s): "INR", "PROTIME" in the last 168 hours. Cardiac Enzymes: No results for input(s): "CKTOTAL", "CKMB", "CKMBINDEX", "TROPONINI" in the last 168 hours. BNP (last 3 results) No results for input(s): "PROBNP" in the last 8760 hours. HbA1C: No results for input(s): "HGBA1C" in the last 72 hours. CBG: No results for input(s): "GLUCAP" in the last 168 hours. Lipid Profile: No results for input(s): "CHOL", "HDL", "LDLCALC", "TRIG", "CHOLHDL", "LDLDIRECT" in the last 72 hours. Thyroid  Function Tests: No results for input(s): "TSH", "T4TOTAL", "FREET4", "T3FREE", "THYROIDAB" in the last 72 hours. Anemia Panel: No results for input(s): "VITAMINB12", "FOLATE", "FERRITIN", "TIBC", "IRON ", "RETICCTPCT" in the last 72 hours. Sepsis Labs: Recent Labs  Lab 01/10/24 1815 01/10/24 1929 01/14/24 0450  PROCALCITON  --   --  <0.10  LATICACIDVEN 0.7 0.7  --     Recent Results (from the past 240 hours)  Blood culture (routine x 2)     Status: None (Preliminary result)   Collection Time: 01/10/24  6:15 PM   Specimen: BLOOD RIGHT WRIST  Result Value Ref Range Status   Specimen Description   Final    BLOOD RIGHT WRIST Performed  at [facility], [address], [place], [place] [address]    Special Requests   Final    BOTTLES DRAWN AEROBIC ONLY Blood Culture results may not be optimal due to an inadequate volume of blood received in culture bottles Performed at [facility], [address], [place], [place] [address]    Culture   Final    NO GROWTH 4 DAYS Performed at [facility], [address], [place], [place] [address]    Report Status PENDING  Incomplete  Blood culture (routine x 2)     Status: None (Preliminary result)   Collection Time: 01/10/24  6:15 PM   Specimen: BLOOD LEFT HAND  Result Value Ref Range Status   Specimen Description   Final  BLOOD LEFT HAND Performed at [facility], [address] [place], [place] [address]    Special Requests   Final    BOTTLES DRAWN AEROBIC ONLY Blood Culture results may not be optimal due to an inadequate volume of blood received in culture bottles Performed at [facility], [address] [place], [place] [address]    Culture   Final    NO GROWTH 4 DAYS Performed at [facility], [address], [place], [place] [address]    Report Status PENDING  Incomplete  Resp panel by RT-PCR (RSV, Flu A&B, Covid) Anterior Nasal Swab     Status: None   Collection Time: 01/10/24  7:29 PM   Specimen: Anterior Nasal Swab  Result Value Ref Range Status   SARS Coronavirus 2 by RT PCR NEGATIVE NEGATIVE Final    Comment: (NOTE) SARS-CoV-2 target nucleic acids are NOT DETECTED.  The SARS-CoV-2 RNA is generally detectable in upper respiratory specimens during the acute phase of infection. The lowest concentration of SARS-CoV-2 viral copies this assay can detect is 138 copies/mL. A negative result does not preclude SARS-Cov-2 infection and should not be used as the sole basis for treatment or other patient management decisions. A negative result may occur with  improper specimen collection/handling,  submission of specimen other than nasopharyngeal swab, presence of viral mutation(s) within the areas targeted by this assay, and inadequate number of viral copies(<138 copies/mL). A negative result must be combined with clinical observations, patient history, and epidemiological information. The expected result is Negative.  Fact Sheet for Patients:  BloggerCourse.com  Fact Sheet for Healthcare Providers:  SeriousBroker.it  This test is no t yet approved or cleared by the [place]  FDA and  has been authorized for detection and/or diagnosis of SARS-CoV-2 by FDA under an Emergency Use Authorization (EUA). This EUA will remain  in effect (meaning this test can be used) for the duration of the COVID-19 declaration under Section 564(b)(1) of the Act, 21 U.S.C.section 360bbb-3(b)(1), unless the authorization is terminated  or revoked sooner.       Influenza A by PCR NEGATIVE NEGATIVE Final   Influenza B by PCR NEGATIVE NEGATIVE Final    Comment: (NOTE) The Xpert Xpress SARS-CoV-2/FLU/RSV plus assay is intended as an aid in the diagnosis of influenza from Nasopharyngeal swab specimens and should not be used as a sole basis for treatment. Nasal washings and aspirates are unacceptable for Xpert Xpress SARS-CoV-2/FLU/RSV testing.  Fact Sheet for Patients: BloggerCourse.com  Fact Sheet for Healthcare Providers: SeriousBroker.it  This test is not yet approved or cleared by the [place]  FDA and has been authorized for detection and/or diagnosis of SARS-CoV-2 by FDA under an Emergency Use Authorization (EUA). This EUA will remain in effect (meaning this test can be used) for the duration of the COVID-19 declaration under Section 564(b)(1) of the Act, 21 U.S.C. section 360bbb-3(b)(1), unless the authorization is terminated or revoked.     Resp Syncytial Virus by PCR NEGATIVE  NEGATIVE Final    Comment: (NOTE) Fact Sheet for Patients: BloggerCourse.com  Fact Sheet for Healthcare Providers: SeriousBroker.it  This test is not yet approved or cleared by the [place]  FDA and has been authorized for detection and/or diagnosis of SARS-CoV-2 by FDA under an Emergency Use Authorization (EUA). This EUA will remain in effect (meaning this test can be used) for the duration of the COVID-19 declaration under Section 564(b)(1) of the Act, 21 U.S.C. section 360bbb-3(b)(1), unless the authorization is  terminated or revoked.  Performed at [facility], [address] [place], [place] [address]   Resp panel by RT-PCR (RSV, Flu A&B, Covid) Anterior Nasal Swab     Status: None   Collection Time: 01/13/24  5:55 PM   Specimen: Anterior Nasal Swab  Result Value Ref Range Status   SARS Coronavirus 2 by RT PCR NEGATIVE NEGATIVE Final    Comment: (NOTE) SARS-CoV-2 target nucleic acids are NOT DETECTED.  The SARS-CoV-2 RNA is generally detectable in upper respiratory specimens during the acute phase of infection. The lowest concentration of SARS-CoV-2 viral copies this assay can detect is 138 copies/mL. A negative result does not preclude SARS-Cov-2 infection and should not be used as the sole basis for treatment or other patient management decisions. A negative result may occur with  improper specimen collection/handling, submission of specimen other than nasopharyngeal swab, presence of viral mutation(s) within the areas targeted by this assay, and inadequate number of viral copies(<138 copies/mL). A negative result must be combined with clinical observations, patient history, and epidemiological information. The expected result is Negative.  Fact Sheet for Patients:  BloggerCourse.com  Fact Sheet for Healthcare Providers:  SeriousBroker.it  This  test is no t yet approved or cleared by the [place]  FDA and  has been authorized for detection and/or diagnosis of SARS-CoV-2 by FDA under an Emergency Use Authorization (EUA). This EUA will remain  in effect (meaning this test can be used) for the duration of the COVID-19 declaration under Section 564(b)(1) of the Act, 21 U.S.C.section 360bbb-3(b)(1), unless the authorization is terminated  or revoked sooner.       Influenza A by PCR NEGATIVE NEGATIVE Final   Influenza B by PCR NEGATIVE NEGATIVE Final    Comment: (NOTE) The Xpert Xpress SARS-CoV-2/FLU/RSV plus assay is intended as an aid in the diagnosis of influenza from Nasopharyngeal swab specimens and should not be used as a sole basis for treatment. Nasal washings and aspirates are unacceptable for Xpert Xpress SARS-CoV-2/FLU/RSV testing.  Fact Sheet for Patients: BloggerCourse.com  Fact Sheet for Healthcare Providers: SeriousBroker.it  This test is not yet approved or cleared by the [place]  FDA and has been authorized for detection and/or diagnosis of SARS-CoV-2 by FDA under an Emergency Use Authorization (EUA). This EUA will remain in effect (meaning this test can be used) for the duration of the COVID-19 declaration under Section 564(b)(1) of the Act, 21 U.S.C. section 360bbb-3(b)(1), unless the authorization is terminated or revoked.     Resp Syncytial Virus by PCR NEGATIVE NEGATIVE Final    Comment: (NOTE) Fact Sheet for Patients: BloggerCourse.com  Fact Sheet for Healthcare Providers: SeriousBroker.it  This test is not yet approved or cleared by the [place]  FDA and has been authorized for detection and/or diagnosis of SARS-CoV-2 by FDA under an Emergency Use Authorization (EUA). This EUA will remain in effect (meaning this test can be used) for the duration of the COVID-19 declaration under  Section 564(b)(1) of the Act, 21 U.S.C. section 360bbb-3(b)(1), unless the authorization is terminated or revoked.  Performed at [facility], [address] [place], [place] [address]          Radiology Studies: DG Chest 2 View Result Date: 01/13/2024 CLINICAL DATA:  hx of COPD reporting productive cough EXAM: CHEST - 2 VIEW COMPARISON:  None available. FINDINGS: Emphysema. Persistent pleural thickening and patchy airspace opacities in the left upper lung zone, likely scarring. No new airspace consolidation or pneumothorax. Blunting of both  costophrenic sulci. No cardiomegaly. No acute fracture or destructive lesion. IMPRESSION: 1. Emphysema.  No pneumonia or pulmonary edema. 2. Chronic left upper lung zone scarring and pleural thickening. Electronically Signed   By: Rance Burrows M.D.   On: 01/13/2024 17:50           LOS: 1 day   Time spent= 35 mins    Maggie Schooner, MD [facility]  If 7PM-7AM, please contact night-coverage  01/14/2024, 3:03 PM

## 2024-01-14 NOTE — Plan of Care (Signed)

## 2024-01-15 DIAGNOSIS — J441 Chronic obstructive pulmonary disease with (acute) exacerbation: Secondary | ICD-10-CM | POA: Diagnosis not present

## 2024-01-15 DIAGNOSIS — E440 Moderate protein-calorie malnutrition: Secondary | ICD-10-CM | POA: Insufficient documentation

## 2024-01-15 LAB — CULTURE, BLOOD (ROUTINE X 2)

## 2024-01-15 LAB — CBC
HCT: 39 % (ref 36.0–46.0)
Hemoglobin: 11.6 g/dL — ABNORMAL LOW (ref 12.0–15.0)
MCH: 29.4 pg (ref 26.0–34.0)
MCHC: 29.7 g/dL — ABNORMAL LOW (ref 30.0–36.0)
MCV: 98.7 fL (ref 80.0–100.0)
Platelets: 245 10*3/uL (ref 150–400)
RBC: 3.95 MIL/uL (ref 3.87–5.11)
RDW: 13.5 % (ref 11.5–15.5)
WBC: 16 10*3/uL — ABNORMAL HIGH (ref 4.0–10.5)
nRBC: 0 % (ref 0.0–0.2)

## 2024-01-15 LAB — BASIC METABOLIC PANEL WITH GFR
Anion gap: 7 (ref 5–15)
BUN: 27 mg/dL — ABNORMAL HIGH (ref 8–23)
CO2: 31 mmol/L (ref 22–32)
Calcium: 9.1 mg/dL (ref 8.9–10.3)
Chloride: 101 mmol/L (ref 98–111)
Creatinine, Ser: 0.64 mg/dL (ref 0.44–1.00)
GFR, Estimated: >60 mL/min (ref >60)
Glucose, Bld: 100 mg/dL — ABNORMAL HIGH (ref 70–99)
Potassium: 4.7 mmol/L (ref 3.5–5.1)
Sodium: 139 mmol/L (ref 135–145)

## 2024-01-15 LAB — MAGNESIUM: Magnesium: 2 mg/dL (ref 1.7–2.4)

## 2024-01-15 LAB — BRAIN NATRIURETIC PEPTIDE: B Natriuretic Peptide: 95.3 pg/mL (ref 0.0–100.0)

## 2024-01-15 MED ORDER — DM-GUAIFENESIN ER 30-600 MG PO TB12
1.0000 | ORAL_TABLET | Freq: Two times a day (BID) | ORAL | Status: DC
  Administered 2024-01-15 – 2024-01-23 (×17): 1 via ORAL
  Filled 2024-01-15 (×17): qty 1

## 2024-01-15 NOTE — Plan of Care (Signed)

## 2024-01-15 NOTE — TOC Progression Note (Signed)
 Transition of Care [facility]) - Progression Note    Patient Details  Name: Tracy Park MRN: [id number] Date of Birth: 1955-11-08  [facility] CM/SW Contact  Kathryn Parish, RN Phone Number: 01/15/2024, 3:20 PM  Clinical Narrative:    TOC continuing to follow   Expected Discharge Plan: Home/Self Care Barriers to Discharge: Continued Medical Work up  Expected Discharge Plan and Services In-house Referral: NA Discharge Planning Services: CM Consult Post Acute Care Choice: NA                   DME Arranged: N/A DME Agency: NA       HH Arranged: NA HH Agency: NA         Social Determinants of Health (SDOH) Interventions SDOH Screenings   Food Insecurity: No Food Insecurity (01/14/2024)  Housing: Low Risk  (01/14/2024)  Transportation Needs: No Transportation Needs (01/14/2024)  Utilities: Not At Risk (01/14/2024)  Financial Resource Strain: High Risk (10/08/2023)   Received from [facility]  Physical Activity: Unknown (04/07/2023)   Received from [facility]  Social Connections: Moderately Integrated (01/14/2024)  Recent Concern: Social Connections - Socially Isolated (12/30/2023)  Stress: Stress Concern Present (04/07/2023)   Received from [facility]  Tobacco Use: High Risk (01/13/2024)    Readmission Risk Interventions    01/14/2024    2:11 PM 01/05/2024   12:08 PM 12/30/2023   11:48 AM  Readmission Risk Prevention Plan  Transportation Screening Complete Complete Complete  Medication Review Oceanographer) Complete Complete Complete  PCP or Specialist appointment within 3-5 days of discharge Complete Complete Complete  HRI or Home Care Consult -- Complete Complete  SW Recovery Care/Counseling Consult -- Complete Complete  Palliative Care Screening Not Applicable Not Applicable Not Applicable  Skilled Nursing Facility Not Applicable Not Applicable Not Applicable

## 2024-01-15 NOTE — Progress Notes (Addendum)
 PROGRESS NOTE    Tracy Park  NWG:[id number] DOB: 1955-09-22 DOA: 01/13/2024 PCP: Chana Comas, FNP    Brief Narrative:  14 with history of COPD/emphysema, chronic hypoxia 2 L nasal cannula, paroxysmal A-fib on Eliquis , lung cancer status post left lobectomy, HTN, HLD, hypothyroidism, depression recently discharged on 4/30 after being treated for COPD exacerbation now presents back to the hospital with shortness of breath and wheezing.  Upon admission CT angio was negative for PE but shows chronic lung changes.  Chest x-ray shows emphysema, started on steroids and bronchodilators   Assessment & Plan:  Principal Problem:   COPD with acute exacerbation and acute on chronic respiratory failure with hypoxia   Acute respiratory distress Acute COPD exacerbation Chronic hypoxia 2 L nasal cannula - Continue steroids, bronchodilators, I-S/flutter valve.  Still with significant wheezing on exam - BNP and procalcitonin within normal limits - Continue doxycycline  -- Continue scheduled Mucinex , as needed Tessalon  Perles  Leukocytosis --WBC of 16,000 in the setting of steroid use -- Trend CBC  Paroxysmal atrial fibrillation --HR stable in the 60s to 70s - Continue Toprol  and Eliquis .  IV as needed  Essential hypertension - Metoprolol  and losartan .  IV as needed  Hypothyroidism - Synthroid   Chronic tobacco use - Counseled to quit using this. Nicotine  as needed  Hypokalemia, resolved - K+ 4.7 today   DVT prophylaxis: SCDs Start: 01/13/24 2248 apixaban  (ELIQUIS ) tablet 5 mg      Code Status: Limited: Do not attempt resuscitation (DNR) -DNR-LIMITED -Do Not Intubate/DNI  Family Communication:   Status is: Inpatient Still in some respiratory distress.  Continue hospital stay    Subjective:  Patient evaluated at bedside laying comfortably in bed.  Reports feeling slightly better but continues to have significant wheezing on exam.  Discussed plan to continue IV steroids for at  least 1 more day.   Examination:  General: Pleasant, well-appearing elderly woman laying in bed. No acute distress. HEENT: [place]/AT. Anicteric sclera CV: RRR. No murmurs, rubs, or gallops. No LE edema Pulmonary: Lungs CTAB. Normal effort.  Moderate expiratory wheezes bilaterally. Decreased air movement throughout. Abdominal: Soft, nontender, nondistended. Normal bowel sounds. Extremities: Palpable radial and DP pulses. Normal ROM. Skin: Warm and dry. No obvious rash or lesions. Neuro: A&Ox3. Moves all extremities. Normal sensation to light touch. No focal deficit. Psych: Normal mood and affect        Diet Orders (From admission, onward)     Start     Ordered   01/13/24 2250  Diet 2 gram sodium Room service appropriate? Yes; Fluid consistency: Thin  Diet effective now       Question Answer Comment  Room service appropriate? Yes   Fluid consistency: Thin      01/13/24 2251            Objective: Vitals:   01/14/24 1200 01/14/24 1424 01/14/24 1944 01/14/24 2036  BP: 103/69  116/80   Pulse: 72  78   Resp: 18  15   Temp: 98.1 F (36.7 C)  98.2 F (36.8 C)   TempSrc: Oral     SpO2: 98% 98% 98% 100%  Weight:      Height:       No intake or output data in the 24 hours ending 01/15/24 0802  Filed Weights   01/13/24 1726  Weight: 46.5 kg    Scheduled Meds:  apixaban   5 mg Oral BID   budesonide  (PULMICORT ) nebulizer solution  0.5 mg Nebulization BID   doxycycline   100 mg Oral Q12H   feeding supplement  237 mL Oral TID BM   gatifloxacin   1 drop Left Eye QID   ipratropium-albuterol   3 mL Nebulization TID   levothyroxine   125 mcg Oral Q0600   magnesium  oxide  400 mg Oral Daily   methylPREDNISolone  (SOLU-MEDROL ) injection  40 mg Intravenous Q12H   metoprolol  succinate  12.5 mg Oral Daily   polyvinyl alcohol   1 drop Both Eyes QID   rosuvastatin   20 mg Oral Daily   sertraline   25 mg Oral Daily   Continuous Infusions:    Nutritional status Signs/Symptoms: mild  fat depletion, severe muscle depletion Interventions: Refer to RD note for recommendations, Ensure Enlive (each supplement provides 350kcal and 20 grams of protein), MVI Body mass index is 18.17 kg/m.  Data Reviewed:   CBC: Recent Labs  Lab 01/10/24 1744 01/13/24 1755 01/14/24 0450 01/15/24 0448  WBC 15.1* 13.6* 8.0 16.0*  NEUTROABS 10.6* 11.1*  --   --   HGB 12.6 12.5 12.1 11.6*  HCT 40.5 42.1 40.1 39.0  MCV 94.4 97.5 97.1 98.7  PLT 252 257 258 245   Basic Metabolic Panel: Recent Labs  Lab 01/10/24 1744 01/13/24 1755 01/14/24 0450 01/15/24 0448  NA 141 141 135 139  K 3.7 3.3* 4.4 4.7  CL 102 102 97* 101  CO2 29 29 30 31   GLUCOSE 113* 123* 159* 100*  BUN 15 17 16  27*  CREATININE 0.47 0.50 0.51 0.64  CALCIUM  9.0 8.1* 8.5* 9.1  MG  --   --   --  2.0   GFR: Estimated Creatinine Clearance: 51.5 mL/min (by C-G formula based on SCr of 0.64 mg/dL). Liver Function Tests: Recent Labs  Lab 01/10/24 1744  AST 16  ALT 16  ALKPHOS 53  BILITOT 0.4  PROT 6.1*  ALBUMIN 3.3*   Recent Labs  Lab 01/10/24 1744  LIPASE 30   No results for input(s): "AMMONIA" in the last 168 hours. Coagulation Profile: No results for input(s): "INR", "PROTIME" in the last 168 hours. Cardiac Enzymes: No results for input(s): "CKTOTAL", "CKMB", "CKMBINDEX", "TROPONINI" in the last 168 hours. BNP (last 3 results) No results for input(s): "PROBNP" in the last 8760 hours. HbA1C: No results for input(s): "HGBA1C" in the last 72 hours. CBG: No results for input(s): "GLUCAP" in the last 168 hours. Lipid Profile: No results for input(s): "CHOL", "HDL", "LDLCALC", "TRIG", "CHOLHDL", "LDLDIRECT" in the last 72 hours. Thyroid  Function Tests: No results for input(s): "TSH", "T4TOTAL", "FREET4", "T3FREE", "THYROIDAB" in the last 72 hours. Anemia Panel: No results for input(s): "VITAMINB12", "FOLATE", "FERRITIN", "TIBC", "IRON ", "RETICCTPCT" in the last 72 hours. Sepsis Labs: Recent Labs  Lab  01/10/24 1815 01/10/24 1929 01/14/24 0450  PROCALCITON  --   --  <0.10  LATICACIDVEN 0.7 0.7  --     Recent Results (from the past 240 hours)  Blood culture (routine x 2)     Status: None (Preliminary result)   Collection Time: 01/10/24  6:15 PM   Specimen: BLOOD RIGHT WRIST  Result Value Ref Range Status   Specimen Description   Final    BLOOD RIGHT WRIST Performed at [facility], [address] [place], [place] [address]    Special Requests   Final    BOTTLES DRAWN AEROBIC ONLY Blood Culture results may not be optimal due to an inadequate volume of blood received in culture bottles Performed at [facility], [address], [place], [place] [address]  Culture   Final    NO GROWTH 4 DAYS Performed at [facility], [address], [place], [place] [address]    Report Status PENDING  Incomplete  Blood culture (routine x 2)     Status: None (Preliminary result)   Collection Time: 01/10/24  6:15 PM   Specimen: BLOOD LEFT HAND  Result Value Ref Range Status   Specimen Description   Final    BLOOD LEFT HAND Performed at [facility], [address], [place], [place] [address]    Special Requests   Final    BOTTLES DRAWN AEROBIC ONLY Blood Culture results may not be optimal due to an inadequate volume of blood received in culture bottles Performed at [facility], [address] [place], [place] [address]    Culture   Final    NO GROWTH 4 DAYS Performed at [facility], [address] [place], [place] [address]    Report Status PENDING  Incomplete  Resp panel by RT-PCR (RSV, Flu A&B, Covid) Anterior Nasal Swab     Status: None   Collection Time: 01/10/24  7:29 PM   Specimen: Anterior Nasal Swab  Result Value Ref Range Status   SARS Coronavirus 2 by RT PCR NEGATIVE NEGATIVE Final    Comment: (NOTE) SARS-CoV-2 target nucleic acids are NOT DETECTED.  The SARS-CoV-2 RNA is generally detectable in upper  respiratory specimens during the acute phase of infection. The lowest concentration of SARS-CoV-2 viral copies this assay can detect is 138 copies/mL. A negative result does not preclude SARS-Cov-2 infection and should not be used as the sole basis for treatment or other patient management decisions. A negative result may occur with  improper specimen collection/handling, submission of specimen other than nasopharyngeal swab, presence of viral mutation(s) within the areas targeted by this assay, and inadequate number of viral copies(<138 copies/mL). A negative result must be combined with clinical observations, patient history, and epidemiological information. The expected result is Negative.  Fact Sheet for Patients:  BloggerCourse.com  Fact Sheet for Healthcare Providers:  SeriousBroker.it  This test is no t yet approved or cleared by the [place]  FDA and  has been authorized for detection and/or diagnosis of SARS-CoV-2 by FDA under an Emergency Use Authorization (EUA). This EUA will remain  in effect (meaning this test can be used) for the duration of the COVID-19 declaration under Section 564(b)(1) of the Act, 21 U.S.C.section 360bbb-3(b)(1), unless the authorization is terminated  or revoked sooner.       Influenza A by PCR NEGATIVE NEGATIVE Final   Influenza B by PCR NEGATIVE NEGATIVE Final    Comment: (NOTE) The Xpert Xpress SARS-CoV-2/FLU/RSV plus assay is intended as an aid in the diagnosis of influenza from Nasopharyngeal swab specimens and should not be used as a sole basis for treatment. Nasal washings and aspirates are unacceptable for Xpert Xpress SARS-CoV-2/FLU/RSV testing.  Fact Sheet for Patients: BloggerCourse.com  Fact Sheet for Healthcare Providers: SeriousBroker.it  This test is not yet approved or cleared by the [place]  FDA and has been  authorized for detection and/or diagnosis of SARS-CoV-2 by FDA under an Emergency Use Authorization (EUA). This EUA will remain in effect (meaning this test can be used) for the duration of the COVID-19 declaration under Section 564(b)(1) of the Act, 21 U.S.C. section 360bbb-3(b)(1), unless the authorization is terminated or revoked.     Resp Syncytial Virus by PCR NEGATIVE NEGATIVE Final    Comment: (NOTE) Fact  Sheet for Patients: BloggerCourse.com  Fact Sheet for Healthcare Providers: SeriousBroker.it  This test is not yet approved or cleared by the [place]  FDA and has been authorized for detection and/or diagnosis of SARS-CoV-2 by FDA under an Emergency Use Authorization (EUA). This EUA will remain in effect (meaning this test can be used) for the duration of the COVID-19 declaration under Section 564(b)(1) of the Act, 21 U.S.C. section 360bbb-3(b)(1), unless the authorization is terminated or revoked.  Performed at [facility], [address], [place], [place] [address]   Resp panel by RT-PCR (RSV, Flu A&B, Covid) Anterior Nasal Swab     Status: None   Collection Time: 01/13/24  5:55 PM   Specimen: Anterior Nasal Swab  Result Value Ref Range Status   SARS Coronavirus 2 by RT PCR NEGATIVE NEGATIVE Final    Comment: (NOTE) SARS-CoV-2 target nucleic acids are NOT DETECTED.  The SARS-CoV-2 RNA is generally detectable in upper respiratory specimens during the acute phase of infection. The lowest concentration of SARS-CoV-2 viral copies this assay can detect is 138 copies/mL. A negative result does not preclude SARS-Cov-2 infection and should not be used as the sole basis for treatment or other patient management decisions. A negative result may occur with  improper specimen collection/handling, submission of specimen other than nasopharyngeal swab, presence of viral mutation(s) within the areas  targeted by this assay, and inadequate number of viral copies(<138 copies/mL). A negative result must be combined with clinical observations, patient history, and epidemiological information. The expected result is Negative.  Fact Sheet for Patients:  BloggerCourse.com  Fact Sheet for Healthcare Providers:  SeriousBroker.it  This test is no t yet approved or cleared by the [place]  FDA and  has been authorized for detection and/or diagnosis of SARS-CoV-2 by FDA under an Emergency Use Authorization (EUA). This EUA will remain  in effect (meaning this test can be used) for the duration of the COVID-19 declaration under Section 564(b)(1) of the Act, 21 U.S.C.section 360bbb-3(b)(1), unless the authorization is terminated  or revoked sooner.       Influenza A by PCR NEGATIVE NEGATIVE Final   Influenza B by PCR NEGATIVE NEGATIVE Final    Comment: (NOTE) The Xpert Xpress SARS-CoV-2/FLU/RSV plus assay is intended as an aid in the diagnosis of influenza from Nasopharyngeal swab specimens and should not be used as a sole basis for treatment. Nasal washings and aspirates are unacceptable for Xpert Xpress SARS-CoV-2/FLU/RSV testing.  Fact Sheet for Patients: BloggerCourse.com  Fact Sheet for Healthcare Providers: SeriousBroker.it  This test is not yet approved or cleared by the [place]  FDA and has been authorized for detection and/or diagnosis of SARS-CoV-2 by FDA under an Emergency Use Authorization (EUA). This EUA will remain in effect (meaning this test can be used) for the duration of the COVID-19 declaration under Section 564(b)(1) of the Act, 21 U.S.C. section 360bbb-3(b)(1), unless the authorization is terminated or revoked.     Resp Syncytial Virus by PCR NEGATIVE NEGATIVE Final    Comment: (NOTE) Fact Sheet for  Patients: BloggerCourse.com  Fact Sheet for Healthcare Providers: SeriousBroker.it  This test is not yet approved or cleared by the [place]  FDA and has been authorized for detection and/or diagnosis of SARS-CoV-2 by FDA under an Emergency Use Authorization (EUA). This EUA will remain in effect (meaning this test can be used) for the duration of the COVID-19 declaration under Section 564(b)(1) of the Act, 21 U.S.C. section 360bbb-3(b)(1), unless the authorization is terminated or revoked.  Performed at [facility], [address], [place], [place] [address]          Radiology Studies: DG Chest 2 View Result Date: 01/13/2024 CLINICAL DATA:  hx of COPD reporting productive cough EXAM: CHEST - 2 VIEW COMPARISON:  None available. FINDINGS: Emphysema. Persistent pleural thickening and patchy airspace opacities in the left upper lung zone, likely scarring. No new airspace consolidation or pneumothorax. Blunting of both costophrenic sulci. No cardiomegaly. No acute fracture or destructive lesion. IMPRESSION: 1. Emphysema.  No pneumonia or pulmonary edema. 2. Chronic left upper lung zone scarring and pleural thickening. Electronically Signed   By: Rance Burrows M.D.   On: 01/13/2024 17:50           LOS: 2 days   Time spent= 35 mins    Vita Grip, MD [facility]  If 7PM-7AM, please contact night-coverage  01/15/2024, 8:02 AM

## 2024-01-16 DIAGNOSIS — J441 Chronic obstructive pulmonary disease with (acute) exacerbation: Secondary | ICD-10-CM | POA: Diagnosis not present

## 2024-01-16 LAB — BASIC METABOLIC PANEL WITH GFR
Anion gap: 7 (ref 5–15)
BUN: 35 mg/dL — ABNORMAL HIGH (ref 8–23)
CO2: 32 mmol/L (ref 22–32)
Calcium: 8.6 mg/dL — ABNORMAL LOW (ref 8.9–10.3)
Chloride: 97 mmol/L — ABNORMAL LOW (ref 98–111)
Creatinine, Ser: 0.6 mg/dL (ref 0.44–1.00)
GFR, Estimated: >60 mL/min (ref >60)
Glucose, Bld: 111 mg/dL — ABNORMAL HIGH (ref 70–99)
Potassium: 4 mmol/L (ref 3.5–5.1)
Sodium: 136 mmol/L (ref 135–145)

## 2024-01-16 LAB — CBC
HCT: 36 % (ref 36.0–46.0)
Hemoglobin: 11.3 g/dL — ABNORMAL LOW (ref 12.0–15.0)
MCH: 29.6 pg (ref 26.0–34.0)
MCHC: 31.4 g/dL (ref 30.0–36.0)
MCV: 94.2 fL (ref 80.0–100.0)
Platelets: 226 10*3/uL (ref 150–400)
RBC: 3.82 MIL/uL — ABNORMAL LOW (ref 3.87–5.11)
RDW: 13.8 % (ref 11.5–15.5)
WBC: 14.5 10*3/uL — ABNORMAL HIGH (ref 4.0–10.5)
nRBC: 0 % (ref 0.0–0.2)

## 2024-01-16 LAB — MAGNESIUM: Magnesium: 1.9 mg/dL (ref 1.7–2.4)

## 2024-01-16 NOTE — Plan of Care (Signed)

## 2024-01-16 NOTE — Progress Notes (Signed)
 PROGRESS NOTE    Tracy Park  MVH:[id number] DOB: 01-01-1956 DOA: 01/13/2024 PCP: Chana Comas, FNP    Brief Narrative:  66 with history of COPD/emphysema, chronic hypoxia 2 L nasal cannula, paroxysmal A-fib on Eliquis , lung cancer status post left lobectomy, HTN, HLD, hypothyroidism, depression recently discharged on 4/30 after being treated for COPD exacerbation now presents back to the hospital with shortness of breath and wheezing.  Upon admission CT angio was negative for PE but shows chronic lung changes.  Chest x-ray shows emphysema, started on steroids and bronchodilators   Assessment & Plan:  Principal Problem:   COPD with acute exacerbation and acute on chronic respiratory failure with hypoxia   Acute respiratory distress Acute COPD exacerbation Chronic hypoxia 2 L nasal cannula - Steroids, bronchodilators, I-S/flutter valve.  Continues to have diffuse wheezing - BNP and procalcitonin are normal -Recently completed azithromycin .  5 days of p.o. doxycycline .  Leukocytosis on setting of steroid use  Paroxysmal atrial fibrillation - Continue Toprol  and Eliquis .  IV as needed  Essential hypertension - Metoprolol  and losartan .  IV as needed  Hypothyroidism - Synthroid   Chronic tobacco use - Counseled to quit using this.  Nicotine  as needed  Hypokalemia - As needed repletion   DVT prophylaxis: SCDs Start: 01/13/24 2248 apixaban  (ELIQUIS ) tablet 5 mg      Code Status: Limited: Do not attempt resuscitation (DNR) -DNR-LIMITED -Do Not Intubate/DNI  Family Communication:   Status is: Inpatient Still significant wheezing and shortness of breath   Subjective: With ambulation her oxygen  has improved but still having the same and shortness of breath  Examination:  General exam: Appears calm and comfortable  Respiratory system: Bilateral diffuse expiratory wheezing Cardiovascular system: S1 & S2 heard, RRR. No JVD, murmurs, rubs, gallops or clicks. No pedal  edema. Gastrointestinal system: Abdomen is nondistended, soft and nontender. No organomegaly or masses felt. Normal bowel sounds heard. Central nervous system: Alert and oriented. No focal neurological deficits. Extremities: Symmetric 5 x 5 power. Skin: No rashes, lesions or ulcers Psychiatry: Judgement and insight appear normal. Mood & affect appropriate.                Diet Orders (From admission, onward)     Start     Ordered   01/13/24 2250  Diet 2 gram sodium Room service appropriate? Yes; Fluid consistency: Thin  Diet effective now       Question Answer Comment  Room service appropriate? Yes   Fluid consistency: Thin      01/13/24 2251            Objective: Vitals:   01/15/24 1916 01/15/24 2043 01/16/24 0606 01/16/24 0957  BP: 110/78  125/87 126/72  Pulse: 69  62 75  Resp: 18  18   Temp: 97.9 F (36.6 C)  98.1 F (36.7 C)   TempSrc: Oral  Oral   SpO2: 98% 100% 100%   Weight:      Height:        Intake/Output Summary (Last 24 hours) at 01/16/2024 1246 Last data filed at 01/15/2024 1500 Gross per 24 hour  Intake 240 ml  Output --  Net 240 ml   Filed Weights   01/13/24 1726  Weight: 46.5 kg    Scheduled Meds:  apixaban   5 mg Oral BID   budesonide  (PULMICORT ) nebulizer solution  0.5 mg Nebulization BID   dextromethorphan -guaiFENesin   1 tablet Oral BID   doxycycline   100 mg Oral Q12H   feeding supplement  237  mL Oral TID BM   gatifloxacin   1 drop Left Eye QID   ipratropium-albuterol   3 mL Nebulization TID   levothyroxine   125 mcg Oral Q0600   magnesium  oxide  400 mg Oral Daily   methylPREDNISolone  (SOLU-MEDROL ) injection  40 mg Intravenous Q12H   metoprolol  succinate  12.5 mg Oral Daily   polyvinyl alcohol   1 drop Both Eyes QID   rosuvastatin   20 mg Oral Daily   sertraline   25 mg Oral Daily   Continuous Infusions:  Nutritional status Signs/Symptoms: mild fat depletion, severe muscle depletion Interventions: Refer to RD note for  recommendations, Ensure Enlive (each supplement provides 350kcal and 20 grams of protein), MVI Body mass index is 18.17 kg/m.  Data Reviewed:   CBC: Recent Labs  Lab 01/10/24 1744 01/13/24 1755 01/14/24 0450 01/15/24 0448 01/16/24 0414  WBC 15.1* 13.6* 8.0 16.0* 14.5*  NEUTROABS 10.6* 11.1*  --   --   --   HGB 12.6 12.5 12.1 11.6* 11.3*  HCT 40.5 42.1 40.1 39.0 36.0  MCV 94.4 97.5 97.1 98.7 94.2  PLT 252 257 258 245 226   Basic Metabolic Panel: Recent Labs  Lab 01/10/24 1744 01/13/24 1755 01/14/24 0450 01/15/24 0448 01/16/24 0414  NA 141 141 135 139 136  K 3.7 3.3* 4.4 4.7 4.0  CL 102 102 97* 101 97*  CO2 29 29 30 31  32  GLUCOSE 113* 123* 159* 100* 111*  BUN 15 17 16  27* 35*  CREATININE 0.47 0.50 0.51 0.64 0.60  CALCIUM  9.0 8.1* 8.5* 9.1 8.6*  MG  --   --   --  2.0 1.9   GFR: Estimated Creatinine Clearance: 51.5 mL/min (by C-G formula based on SCr of 0.6 mg/dL). Liver Function Tests: Recent Labs  Lab 01/10/24 1744  AST 16  ALT 16  ALKPHOS 53  BILITOT 0.4  PROT 6.1*  ALBUMIN 3.3*   Recent Labs  Lab 01/10/24 1744  LIPASE 30   No results for input(s): "AMMONIA" in the last 168 hours. Coagulation Profile: No results for input(s): "INR", "PROTIME" in the last 168 hours. Cardiac Enzymes: No results for input(s): "CKTOTAL", "CKMB", "CKMBINDEX", "TROPONINI" in the last 168 hours. BNP (last 3 results) No results for input(s): "PROBNP" in the last 8760 hours. HbA1C: No results for input(s): "HGBA1C" in the last 72 hours. CBG: No results for input(s): "GLUCAP" in the last 168 hours. Lipid Profile: No results for input(s): "CHOL", "HDL", "LDLCALC", "TRIG", "CHOLHDL", "LDLDIRECT" in the last 72 hours. Thyroid  Function Tests: No results for input(s): "TSH", "T4TOTAL", "FREET4", "T3FREE", "THYROIDAB" in the last 72 hours. Anemia Panel: No results for input(s): "VITAMINB12", "FOLATE", "FERRITIN", "TIBC", "IRON ", "RETICCTPCT" in the last 72 hours. Sepsis  Labs: Recent Labs  Lab 01/10/24 1815 01/10/24 1929 01/14/24 0450  PROCALCITON  --   --  <0.10  LATICACIDVEN 0.7 0.7  --     Recent Results (from the past 240 hours)  Blood culture (routine x 2)     Status: None   Collection Time: 01/10/24  6:15 PM   Specimen: BLOOD RIGHT WRIST  Result Value Ref Range Status   Specimen Description   Final    BLOOD RIGHT WRIST Performed at [facility], [address] [place], [place] [address]    Special Requests   Final    BOTTLES DRAWN AEROBIC ONLY Blood Culture results may not be optimal due to an inadequate volume of blood received in culture bottles Performed at [facility], [address] [place], [place] [address]    Culture   Final    NO GROWTH 5 DAYS Performed at [facility], [address], [place], [place] [address]    Report Status 01/15/2024 FINAL  Final  Blood culture (routine x 2)     Status: None   Collection Time: 01/10/24  6:15 PM   Specimen: BLOOD LEFT HAND  Result Value Ref Range Status   Specimen Description   Final    BLOOD LEFT HAND Performed at [facility], [address], [place], [place] [address]    Special Requests   Final    BOTTLES DRAWN AEROBIC ONLY Blood Culture results may not be optimal due to an inadequate volume of blood received in culture bottles Performed at [facility], [address] [place], [place] [address]    Culture   Final    NO GROWTH 5 DAYS Performed at [facility], [address], [place], [place] [address]    Report Status 01/15/2024 FINAL  Final  Resp panel by RT-PCR (RSV, Flu A&B, Covid) Anterior Nasal Swab     Status: None   Collection Time: 01/10/24  7:29 PM   Specimen: Anterior Nasal Swab  Result Value Ref Range Status   SARS Coronavirus 2 by RT PCR NEGATIVE NEGATIVE Final    Comment: (NOTE) SARS-CoV-2 target nucleic acids are NOT DETECTED.  The SARS-CoV-2 RNA is generally detectable in upper  respiratory specimens during the acute phase of infection. The lowest concentration of SARS-CoV-2 viral copies this assay can detect is 138 copies/mL. A negative result does not preclude SARS-Cov-2 infection and should not be used as the sole basis for treatment or other patient management decisions. A negative result may occur with  improper specimen collection/handling, submission of specimen other than nasopharyngeal swab, presence of viral mutation(s) within the areas targeted by this assay, and inadequate number of viral copies(<138 copies/mL). A negative result must be combined with clinical observations, patient history, and epidemiological information. The expected result is Negative.  Fact Sheet for Patients:  BloggerCourse.com  Fact Sheet for Healthcare Providers:  SeriousBroker.it  This test is no t yet approved or cleared by the [place]  FDA and  has been authorized for detection and/or diagnosis of SARS-CoV-2 by FDA under an Emergency Use Authorization (EUA). This EUA will remain  in effect (meaning this test can be used) for the duration of the COVID-19 declaration under Section 564(b)(1) of the Act, 21 U.S.C.section 360bbb-3(b)(1), unless the authorization is terminated  or revoked sooner.       Influenza A by PCR NEGATIVE NEGATIVE Final   Influenza B by PCR NEGATIVE NEGATIVE Final    Comment: (NOTE) The Xpert Xpress SARS-CoV-2/FLU/RSV plus assay is intended as an aid in the diagnosis of influenza from Nasopharyngeal swab specimens and should not be used as a sole basis for treatment. Nasal washings and aspirates are unacceptable for Xpert Xpress SARS-CoV-2/FLU/RSV testing.  Fact Sheet for Patients: BloggerCourse.com  Fact Sheet for Healthcare Providers: SeriousBroker.it  This test is not yet approved or cleared by the [place]  FDA and has been  authorized for detection and/or diagnosis of SARS-CoV-2 by FDA under an Emergency Use Authorization (EUA). This EUA will remain in effect (meaning this test can be used) for the duration of the COVID-19 declaration under Section 564(b)(1) of the Act, 21 U.S.C. section 360bbb-3(b)(1), unless the authorization is terminated or revoked.     Resp Syncytial Virus by PCR NEGATIVE NEGATIVE  Final    Comment: (NOTE) Fact Sheet for Patients: BloggerCourse.com  Fact Sheet for Healthcare Providers: SeriousBroker.it  This test is not yet approved or cleared by the [place]  FDA and has been authorized for detection and/or diagnosis of SARS-CoV-2 by FDA under an Emergency Use Authorization (EUA). This EUA will remain in effect (meaning this test can be used) for the duration of the COVID-19 declaration under Section 564(b)(1) of the Act, 21 U.S.C. section 360bbb-3(b)(1), unless the authorization is terminated or revoked.  Performed at [facility], [address] [place], [place] [address]   Resp panel by RT-PCR (RSV, Flu A&B, Covid) Anterior Nasal Swab     Status: None   Collection Time: 01/13/24  5:55 PM   Specimen: Anterior Nasal Swab  Result Value Ref Range Status   SARS Coronavirus 2 by RT PCR NEGATIVE NEGATIVE Final    Comment: (NOTE) SARS-CoV-2 target nucleic acids are NOT DETECTED.  The SARS-CoV-2 RNA is generally detectable in upper respiratory specimens during the acute phase of infection. The lowest concentration of SARS-CoV-2 viral copies this assay can detect is 138 copies/mL. A negative result does not preclude SARS-Cov-2 infection and should not be used as the sole basis for treatment or other patient management decisions. A negative result may occur with  improper specimen collection/handling, submission of specimen other than nasopharyngeal swab, presence of viral mutation(s) within the areas  targeted by this assay, and inadequate number of viral copies(<138 copies/mL). A negative result must be combined with clinical observations, patient history, and epidemiological information. The expected result is Negative.  Fact Sheet for Patients:  BloggerCourse.com  Fact Sheet for Healthcare Providers:  SeriousBroker.it  This test is no t yet approved or cleared by the [place]  FDA and  has been authorized for detection and/or diagnosis of SARS-CoV-2 by FDA under an Emergency Use Authorization (EUA). This EUA will remain  in effect (meaning this test can be used) for the duration of the COVID-19 declaration under Section 564(b)(1) of the Act, 21 U.S.C.section 360bbb-3(b)(1), unless the authorization is terminated  or revoked sooner.       Influenza A by PCR NEGATIVE NEGATIVE Final   Influenza B by PCR NEGATIVE NEGATIVE Final    Comment: (NOTE) The Xpert Xpress SARS-CoV-2/FLU/RSV plus assay is intended as an aid in the diagnosis of influenza from Nasopharyngeal swab specimens and should not be used as a sole basis for treatment. Nasal washings and aspirates are unacceptable for Xpert Xpress SARS-CoV-2/FLU/RSV testing.  Fact Sheet for Patients: BloggerCourse.com  Fact Sheet for Healthcare Providers: SeriousBroker.it  This test is not yet approved or cleared by the [place]  FDA and has been authorized for detection and/or diagnosis of SARS-CoV-2 by FDA under an Emergency Use Authorization (EUA). This EUA will remain in effect (meaning this test can be used) for the duration of the COVID-19 declaration under Section 564(b)(1) of the Act, 21 U.S.C. section 360bbb-3(b)(1), unless the authorization is terminated or revoked.     Resp Syncytial Virus by PCR NEGATIVE NEGATIVE Final    Comment: (NOTE) Fact Sheet for  Patients: BloggerCourse.com  Fact Sheet for Healthcare Providers: SeriousBroker.it  This test is not yet approved or cleared by the [place]  FDA and has been authorized for detection and/or diagnosis of SARS-CoV-2 by FDA under an Emergency Use Authorization (EUA). This EUA will remain in effect (meaning this test can be used) for the duration of the COVID-19 declaration under Section 564(b)(1) of the Act, 21 U.S.C. section 360bbb-3(b)(1),  unless the authorization is terminated or revoked.  Performed at [facility], [address] [place], [place] [address]          Radiology Studies: No results found.         LOS: 3 days   Time spent= 35 mins    Maggie Schooner, MD [facility]  If 7PM-7AM, please contact night-coverage  01/16/2024, 12:46 PM

## 2024-01-16 NOTE — Progress Notes (Signed)
 SATURATION QUALIFICATIONS: (This note is used to comply with regulatory documentation for home oxygen )  Patient Saturations on Room Air at Rest = 79%  Patient Saturations on Room Air while Ambulating = 76%  Patient Saturations on Liters of oxygen  while Ambulating = 97%  Please briefly explain why patient needs home oxygen :

## 2024-01-17 DIAGNOSIS — J441 Chronic obstructive pulmonary disease with (acute) exacerbation: Secondary | ICD-10-CM | POA: Diagnosis not present

## 2024-01-17 LAB — BASIC METABOLIC PANEL WITH GFR
Anion gap: 8 (ref 5–15)
BUN: 26 mg/dL — ABNORMAL HIGH (ref 8–23)
CO2: 32 mmol/L (ref 22–32)
Calcium: 8.7 mg/dL — ABNORMAL LOW (ref 8.9–10.3)
Chloride: 97 mmol/L — ABNORMAL LOW (ref 98–111)
Creatinine, Ser: 0.6 mg/dL (ref 0.44–1.00)
GFR, Estimated: >60 mL/min (ref >60)
Glucose, Bld: 125 mg/dL — ABNORMAL HIGH (ref 70–99)
Potassium: 4 mmol/L (ref 3.5–5.1)
Sodium: 137 mmol/L (ref 135–145)

## 2024-01-17 LAB — CBC
HCT: 36.1 % (ref 36.0–46.0)
Hemoglobin: 11.2 g/dL — ABNORMAL LOW (ref 12.0–15.0)
MCH: 29.2 pg (ref 26.0–34.0)
MCHC: 31 g/dL (ref 30.0–36.0)
MCV: 94.3 fL (ref 80.0–100.0)
Platelets: 221 10*3/uL (ref 150–400)
RBC: 3.83 MIL/uL — ABNORMAL LOW (ref 3.87–5.11)
RDW: 13.9 % (ref 11.5–15.5)
WBC: 12.6 10*3/uL — ABNORMAL HIGH (ref 4.0–10.5)
nRBC: 0 % (ref 0.0–0.2)

## 2024-01-17 LAB — MAGNESIUM: Magnesium: 1.9 mg/dL (ref 1.7–2.4)

## 2024-01-17 NOTE — Plan of Care (Signed)

## 2024-01-17 NOTE — Progress Notes (Signed)
 PROGRESS NOTE    Tracy Park  ZOX:[id number] DOB: December 06, 1955 DOA: 01/13/2024 PCP: Chana Comas, FNP    Brief Narrative:  89 with history of COPD/emphysema, chronic hypoxia 2 L nasal cannula, paroxysmal A-fib on Eliquis , lung cancer status post left lobectomy, HTN, HLD, hypothyroidism, depression recently discharged on 4/30 after being treated for COPD exacerbation now presents back to the hospital with shortness of breath and wheezing.  Upon admission CT angio was negative for PE but shows chronic lung changes.  Chest x-ray shows emphysema, started on steroids and bronchodilators   Assessment & Plan:  Principal Problem:   COPD with acute exacerbation and acute on chronic respiratory failure with hypoxia   Acute respiratory distress Acute COPD exacerbation Chronic hypoxia 2 L nasal cannula - Steroids, bronchodilators, I-S/flutter valve.  Continues to have diffuse wheezing - BNP and procalcitonin are normal -Recently completed azithromycin .  5 days of p.o. doxycycline .  Leukocytosis on setting of steroid use  Paroxysmal atrial fibrillation - Continue Toprol  and Eliquis .  IV as needed  Essential hypertension - Metoprolol  and losartan .  IV as needed  Hypothyroidism - Synthroid   Chronic tobacco use - Counseled to quit using this.  Nicotine  as needed  Hypokalemia - As needed repletion   DVT prophylaxis: SCDs Start: 01/13/24 2248 apixaban  (ELIQUIS ) tablet 5 mg      Code Status: Limited: Do not attempt resuscitation (DNR) -DNR-LIMITED -Do Not Intubate/DNI  Family Communication:   Status is: Inpatient Still significant wheezing and shortness of breath   Subjective: Still quite a bit of coughing and wheezing.  Examination:  General exam: Appears calm and comfortable  Respiratory system: Bilateral diffuse expiratory wheezing Cardiovascular system: S1 & S2 heard, RRR. No JVD, murmurs, rubs, gallops or clicks. No pedal edema. Gastrointestinal system: Abdomen is  nondistended, soft and nontender. No organomegaly or masses felt. Normal bowel sounds heard. Central nervous system: Alert and oriented. No focal neurological deficits. Extremities: Symmetric 5 x 5 power. Skin: No rashes, lesions or ulcers Psychiatry: Judgement and insight appear normal. Mood & affect appropriate.                Diet Orders (From admission, onward)     Start     Ordered   01/13/24 2250  Diet 2 gram sodium Room service appropriate? Yes; Fluid consistency: Thin  Diet effective now       Question Answer Comment  Room service appropriate? Yes   Fluid consistency: Thin      01/13/24 2251            Objective: Vitals:   01/16/24 2139 01/17/24 0406 01/17/24 0919 01/17/24 1016  BP:  114/76 116/77   Pulse:  64    Resp:  20    Temp:  97.6 F (36.4 C)    TempSrc:      SpO2: 96% 100%  97%  Weight:      Height:        Intake/Output Summary (Last 24 hours) at 01/17/2024 1120 Last data filed at 01/16/2024 2040 Gross per 24 hour  Intake 240 ml  Output --  Net 240 ml   Filed Weights   01/13/24 1726  Weight: 46.5 kg    Scheduled Meds:  apixaban   5 mg Oral BID   budesonide  (PULMICORT ) nebulizer solution  0.5 mg Nebulization BID   dextromethorphan -guaiFENesin   1 tablet Oral BID   doxycycline   100 mg Oral Q12H   feeding supplement  237 mL Oral TID BM   gatifloxacin   1 drop  Left Eye QID   ipratropium-albuterol   3 mL Nebulization TID   levothyroxine   125 mcg Oral Q0600   magnesium  oxide  400 mg Oral Daily   methylPREDNISolone  (SOLU-MEDROL ) injection  40 mg Intravenous Q12H   metoprolol  succinate  12.5 mg Oral Daily   polyvinyl alcohol   1 drop Both Eyes QID   rosuvastatin   20 mg Oral Daily   sertraline   25 mg Oral Daily   Continuous Infusions:  Nutritional status Signs/Symptoms: mild fat depletion, severe muscle depletion Interventions: Refer to RD note for recommendations, Ensure Enlive (each supplement provides 350kcal and 20 grams of protein),  MVI Body mass index is 18.17 kg/m.  Data Reviewed:   CBC: Recent Labs  Lab 01/10/24 1744 01/13/24 1755 01/14/24 0450 01/15/24 0448 01/16/24 0414 01/17/24 0526  WBC 15.1* 13.6* 8.0 16.0* 14.5* 12.6*  NEUTROABS 10.6* 11.1*  --   --   --   --   HGB 12.6 12.5 12.1 11.6* 11.3* 11.2*  HCT 40.5 42.1 40.1 39.0 36.0 36.1  MCV 94.4 97.5 97.1 98.7 94.2 94.3  PLT 252 257 258 245 226 221   Basic Metabolic Panel: Recent Labs  Lab 01/13/24 1755 01/14/24 0450 01/15/24 0448 01/16/24 0414 01/17/24 0526  NA 141 135 139 136 137  K 3.3* 4.4 4.7 4.0 4.0  CL 102 97* 101 97* 97*  CO2 29 30 31  32 32  GLUCOSE 123* 159* 100* 111* 125*  BUN 17 16 27* 35* 26*  CREATININE 0.50 0.51 0.64 0.60 0.60  CALCIUM  8.1* 8.5* 9.1 8.6* 8.7*  MG  --   --  2.0 1.9 1.9   GFR: Estimated Creatinine Clearance: 51.5 mL/min (by C-G formula based on SCr of 0.6 mg/dL). Liver Function Tests: Recent Labs  Lab 01/10/24 1744  AST 16  ALT 16  ALKPHOS 53  BILITOT 0.4  PROT 6.1*  ALBUMIN 3.3*   Recent Labs  Lab 01/10/24 1744  LIPASE 30   No results for input(s): "AMMONIA" in the last 168 hours. Coagulation Profile: No results for input(s): "INR", "PROTIME" in the last 168 hours. Cardiac Enzymes: No results for input(s): "CKTOTAL", "CKMB", "CKMBINDEX", "TROPONINI" in the last 168 hours. BNP (last 3 results) No results for input(s): "PROBNP" in the last 8760 hours. HbA1C: No results for input(s): "HGBA1C" in the last 72 hours. CBG: No results for input(s): "GLUCAP" in the last 168 hours. Lipid Profile: No results for input(s): "CHOL", "HDL", "LDLCALC", "TRIG", "CHOLHDL", "LDLDIRECT" in the last 72 hours. Thyroid  Function Tests: No results for input(s): "TSH", "T4TOTAL", "FREET4", "T3FREE", "THYROIDAB" in the last 72 hours. Anemia Panel: No results for input(s): "VITAMINB12", "FOLATE", "FERRITIN", "TIBC", "IRON ", "RETICCTPCT" in the last 72 hours. Sepsis Labs: Recent Labs  Lab 01/10/24 1815  01/10/24 1929 01/14/24 0450  PROCALCITON  --   --  <0.10  LATICACIDVEN 0.7 0.7  --     Recent Results (from the past 240 hours)  Blood culture (routine x 2)     Status: None   Collection Time: 01/10/24  6:15 PM   Specimen: BLOOD RIGHT WRIST  Result Value Ref Range Status   Specimen Description   Final    BLOOD RIGHT WRIST Performed at [facility], [address], [place], [place] [address]    Special Requests   Final    BOTTLES DRAWN AEROBIC ONLY Blood Culture results may not be optimal due to an inadequate volume of blood received in culture bottles Performed at [facility], [address] [place],  [place] [address]    Culture   Final    NO GROWTH 5 DAYS Performed at [facility], [address] [place], [place] [address]    Report Status 01/15/2024 FINAL  Final  Blood culture (routine x 2)     Status: None   Collection Time: 01/10/24  6:15 PM   Specimen: BLOOD LEFT HAND  Result Value Ref Range Status   Specimen Description   Final    BLOOD LEFT HAND Performed at [facility], [address] [place], [place] [address]    Special Requests   Final    BOTTLES DRAWN AEROBIC ONLY Blood Culture results may not be optimal due to an inadequate volume of blood received in culture bottles Performed at [facility], [address], [place], [place] [address]    Culture   Final    NO GROWTH 5 DAYS Performed at [facility], [address] [place], [place] [address]    Report Status 01/15/2024 FINAL  Final  Resp panel by RT-PCR (RSV, Flu A&B, Covid) Anterior Nasal Swab     Status: None   Collection Time: 01/10/24  7:29 PM   Specimen: Anterior Nasal Swab  Result Value Ref Range Status   SARS Coronavirus 2 by RT PCR NEGATIVE NEGATIVE Final    Comment: (NOTE) SARS-CoV-2 target nucleic acids are NOT DETECTED.  The SARS-CoV-2 RNA is generally detectable in upper respiratory specimens during the acute phase of  infection. The lowest concentration of SARS-CoV-2 viral copies this assay can detect is 138 copies/mL. A negative result does not preclude SARS-Cov-2 infection and should not be used as the sole basis for treatment or other patient management decisions. A negative result may occur with  improper specimen collection/handling, submission of specimen other than nasopharyngeal swab, presence of viral mutation(s) within the areas targeted by this assay, and inadequate number of viral copies(<138 copies/mL). A negative result must be combined with clinical observations, patient history, and epidemiological information. The expected result is Negative.  Fact Sheet for Patients:  BloggerCourse.com  Fact Sheet for Healthcare Providers:  SeriousBroker.it  This test is no t yet approved or cleared by the [place]  FDA and  has been authorized for detection and/or diagnosis of SARS-CoV-2 by FDA under an Emergency Use Authorization (EUA). This EUA will remain  in effect (meaning this test can be used) for the duration of the COVID-19 declaration under Section 564(b)(1) of the Act, 21 U.S.C.section 360bbb-3(b)(1), unless the authorization is terminated  or revoked sooner.       Influenza A by PCR NEGATIVE NEGATIVE Final   Influenza B by PCR NEGATIVE NEGATIVE Final    Comment: (NOTE) The Xpert Xpress SARS-CoV-2/FLU/RSV plus assay is intended as an aid in the diagnosis of influenza from Nasopharyngeal swab specimens and should not be used as a sole basis for treatment. Nasal washings and aspirates are unacceptable for Xpert Xpress SARS-CoV-2/FLU/RSV testing.  Fact Sheet for Patients: BloggerCourse.com  Fact Sheet for Healthcare Providers: SeriousBroker.it  This test is not yet approved or cleared by the [place]  FDA and has been authorized for detection and/or diagnosis of SARS-CoV-2  by FDA under an Emergency Use Authorization (EUA). This EUA will remain in effect (meaning this test can be used) for the duration of the COVID-19 declaration under Section 564(b)(1) of the Act, 21 U.S.C. section 360bbb-3(b)(1), unless the authorization is terminated or revoked.     Resp Syncytial Virus by PCR NEGATIVE NEGATIVE Final  Comment: (NOTE) Fact Sheet for Patients: BloggerCourse.com  Fact Sheet for Healthcare Providers: SeriousBroker.it  This test is not yet approved or cleared by the [place]  FDA and has been authorized for detection and/or diagnosis of SARS-CoV-2 by FDA under an Emergency Use Authorization (EUA). This EUA will remain in effect (meaning this test can be used) for the duration of the COVID-19 declaration under Section 564(b)(1) of the Act, 21 U.S.C. section 360bbb-3(b)(1), unless the authorization is terminated or revoked.  Performed at [facility], [address], [place], [place] [address]   Resp panel by RT-PCR (RSV, Flu A&B, Covid) Anterior Nasal Swab     Status: None   Collection Time: 01/13/24  5:55 PM   Specimen: Anterior Nasal Swab  Result Value Ref Range Status   SARS Coronavirus 2 by RT PCR NEGATIVE NEGATIVE Final    Comment: (NOTE) SARS-CoV-2 target nucleic acids are NOT DETECTED.  The SARS-CoV-2 RNA is generally detectable in upper respiratory specimens during the acute phase of infection. The lowest concentration of SARS-CoV-2 viral copies this assay can detect is 138 copies/mL. A negative result does not preclude SARS-Cov-2 infection and should not be used as the sole basis for treatment or other patient management decisions. A negative result may occur with  improper specimen collection/handling, submission of specimen other than nasopharyngeal swab, presence of viral mutation(s) within the areas targeted by this assay, and inadequate number of  viral copies(<138 copies/mL). A negative result must be combined with clinical observations, patient history, and epidemiological information. The expected result is Negative.  Fact Sheet for Patients:  BloggerCourse.com  Fact Sheet for Healthcare Providers:  SeriousBroker.it  This test is no t yet approved or cleared by the [place]  FDA and  has been authorized for detection and/or diagnosis of SARS-CoV-2 by FDA under an Emergency Use Authorization (EUA). This EUA will remain  in effect (meaning this test can be used) for the duration of the COVID-19 declaration under Section 564(b)(1) of the Act, 21 U.S.C.section 360bbb-3(b)(1), unless the authorization is terminated  or revoked sooner.       Influenza A by PCR NEGATIVE NEGATIVE Final   Influenza B by PCR NEGATIVE NEGATIVE Final    Comment: (NOTE) The Xpert Xpress SARS-CoV-2/FLU/RSV plus assay is intended as an aid in the diagnosis of influenza from Nasopharyngeal swab specimens and should not be used as a sole basis for treatment. Nasal washings and aspirates are unacceptable for Xpert Xpress SARS-CoV-2/FLU/RSV testing.  Fact Sheet for Patients: BloggerCourse.com  Fact Sheet for Healthcare Providers: SeriousBroker.it  This test is not yet approved or cleared by the [place]  FDA and has been authorized for detection and/or diagnosis of SARS-CoV-2 by FDA under an Emergency Use Authorization (EUA). This EUA will remain in effect (meaning this test can be used) for the duration of the COVID-19 declaration under Section 564(b)(1) of the Act, 21 U.S.C. section 360bbb-3(b)(1), unless the authorization is terminated or revoked.     Resp Syncytial Virus by PCR NEGATIVE NEGATIVE Final    Comment: (NOTE) Fact Sheet for Patients: BloggerCourse.com  Fact Sheet for Healthcare  Providers: SeriousBroker.it  This test is not yet approved or cleared by the [place]  FDA and has been authorized for detection and/or diagnosis of SARS-CoV-2 by FDA under an Emergency Use Authorization (EUA). This EUA will remain in effect (meaning this test can be used) for the duration of the COVID-19 declaration under Section 564(b)(1) of the Act, 21 U.S.C. section 360bbb-3(b)(1), unless the authorization is  terminated or revoked.  Performed at [facility], [address], [place], [place] [address]          Radiology Studies: No results found.         LOS: 4 days   Time spent= 35 mins    Maggie Schooner, MD [facility]  If 7PM-7AM, please contact night-coverage  01/17/2024, 11:20 AM

## 2024-01-18 DIAGNOSIS — J441 Chronic obstructive pulmonary disease with (acute) exacerbation: Secondary | ICD-10-CM | POA: Diagnosis not present

## 2024-01-18 LAB — RESPIRATORY PANEL BY PCR

## 2024-01-18 LAB — BASIC METABOLIC PANEL WITH GFR
Anion gap: 7 (ref 5–15)
BUN: 30 mg/dL — ABNORMAL HIGH (ref 8–23)
CO2: 34 mmol/L — ABNORMAL HIGH (ref 22–32)
Calcium: 8.7 mg/dL — ABNORMAL LOW (ref 8.9–10.3)
Chloride: 98 mmol/L (ref 98–111)
Creatinine, Ser: 0.7 mg/dL (ref 0.44–1.00)
GFR, Estimated: >60 mL/min (ref >60)
Glucose, Bld: 105 mg/dL — ABNORMAL HIGH (ref 70–99)
Potassium: 4.3 mmol/L (ref 3.5–5.1)
Sodium: 139 mmol/L (ref 135–145)

## 2024-01-18 LAB — CBC
HCT: 37.3 % (ref 36.0–46.0)
Hemoglobin: 11.3 g/dL — ABNORMAL LOW (ref 12.0–15.0)
MCH: 29.1 pg (ref 26.0–34.0)
MCHC: 30.3 g/dL (ref 30.0–36.0)
MCV: 96.1 fL (ref 80.0–100.0)
Platelets: 195 10*3/uL (ref 150–400)
RBC: 3.88 MIL/uL (ref 3.87–5.11)
RDW: 13.8 % (ref 11.5–15.5)
WBC: 14.5 10*3/uL — ABNORMAL HIGH (ref 4.0–10.5)
nRBC: 0 % (ref 0.0–0.2)

## 2024-01-18 LAB — MAGNESIUM: Magnesium: 2.1 mg/dL (ref 1.7–2.4)

## 2024-01-18 LAB — EXPECTORATED SPUTUM ASSESSMENT W GRAM STAIN, RFLX TO RESP C

## 2024-01-18 MED ORDER — LEVOFLOXACIN 750 MG PO TABS
750.0000 mg | ORAL_TABLET | Freq: Every day | ORAL | Status: DC
  Administered 2024-01-18 – 2024-01-19 (×2): 750 mg via ORAL
  Filled 2024-01-18 (×2): qty 1

## 2024-01-18 MED ORDER — SODIUM CHLORIDE 3 % IN NEBU
4.0000 mL | INHALATION_SOLUTION | Freq: Two times a day (BID) | RESPIRATORY_TRACT | Status: AC
Start: 2024-01-18 — End: 2024-01-21
  Administered 2024-01-18 – 2024-01-20 (×6): 4 mL via RESPIRATORY_TRACT
  Filled 2024-01-18 (×6): qty 4

## 2024-01-18 NOTE — Plan of Care (Signed)
  Problem: Education: Goal: Knowledge of General Education information will improve Description: Including pain rating scale, medication(s)/side effects and non-pharmacologic comfort measures Outcome: Progressing   Problem: Health Behavior/Discharge Planning: Goal: Ability to manage health-related needs will improve Outcome: Progressing   Problem: Clinical Measurements: Goal: Ability to maintain clinical measurements within normal limits will improve Outcome: Progressing Goal: Will remain free from infection Outcome: Progressing Goal: Diagnostic test results will improve Outcome: Progressing Goal: Respiratory complications will improve Outcome: Progressing Goal: Cardiovascular complication will be avoided Outcome: Progressing   Problem: Nutrition: Goal: Adequate nutrition will be maintained Outcome: Progressing   Problem: Coping: Goal: Level of anxiety will decrease Outcome: Progressing   Problem: Elimination: Goal: Will not experience complications related to bowel motility Outcome: Progressing Goal: Will not experience complications related to urinary retention Outcome: Progressing   Problem: Pain Managment: Goal: General experience of comfort will improve and/or be controlled Outcome: Progressing

## 2024-01-18 NOTE — Progress Notes (Signed)
 PROGRESS NOTE    TINAYA SESSUMS  ZOX:[id number] DOB: [birth year] DOA: 01/13/2024 PCP: Chana Comas, FNP    Brief Narrative:  22 with history of COPD/emphysema, chronic hypoxia 2 L nasal cannula, paroxysmal A-fib on Eliquis , lung cancer status post left lobectomy, HTN, HLD, hypothyroidism, depression recently discharged on 4/30 after being treated for COPD exacerbation now presents back to the hospital with shortness of breath and wheezing.  Upon admission CT angio was negative for PE but shows chronic lung changes.  Chest x-ray shows emphysema, started on steroids and bronchodilators   Assessment & Plan:  Principal Problem:   COPD with acute exacerbation and acute on chronic respiratory failure with hypoxia   Acute respiratory distress Acute COPD exacerbation Chronic hypoxia 2 L nasal cannula - Steroids, bronchodilators, I-S/flutter valve.  Continues to have diffuse wheezing. Sloe to improve, will discuss with Pulm to see if we need to alter any management.  - BNP and procalcitonin are normal -Recently completed azithromycin .  5 days of p.o. doxycycline .  Leukocytosis on setting of steroid use  Paroxysmal atrial fibrillation - Continue Toprol  and Eliquis .  IV as needed  Essential hypertension - Metoprolol  and losartan .  IV as needed  Hypothyroidism - Synthroid   Chronic tobacco use - Counseled to quit using this.  Nicotine  as needed  Hypokalemia - As needed repletion   DVT prophylaxis: SCDs Start: 01/13/24 2248 apixaban  (ELIQUIS ) tablet 5 mg      Code Status: Limited: Do not attempt resuscitation (DNR) -DNR-LIMITED -Do Not Intubate/DNI  Family Communication:   Status is: Inpatient Still significant wheezing and shortness of breath   Subjective: Still some coughing and wheezing. Improved. Continues to smoke  Examination:  General exam: Appears calm and comfortable  Respiratory system: Bilateral diffuse expiratory wheezing Cardiovascular system: S1 & S2 heard, RRR.  No JVD, murmurs, rubs, gallops or clicks. No pedal edema. Gastrointestinal system: Abdomen is nondistended, soft and nontender. No organomegaly or masses felt. Normal bowel sounds heard. Central nervous system: Alert and oriented. No focal neurological deficits. Extremities: Symmetric 5 x 5 power. Skin: No rashes, lesions or ulcers Psychiatry: Judgement and insight appear normal. Mood & affect appropriate.                Diet Orders (From admission, onward)     Start     Ordered   01/13/24 2250  Diet 2 gram sodium Room service appropriate? Yes; Fluid consistency: Thin  Diet effective now       Question Answer Comment  Room service appropriate? Yes   Fluid consistency: Thin      01/13/24 2251            Objective: Vitals:   01/17/24 2127 01/18/24 0428 01/18/24 0900 01/18/24 1240  BP: 116/70 127/80  114/74  Pulse: 76 65  74  Resp: 20 18  20   Temp: 98.3 F (36.8 C) 97.9 F (36.6 C)  98.1 F (36.7 C)  TempSrc:    Oral  SpO2: 96% 99% 98%   Weight:      Height:        Intake/Output Summary (Last 24 hours) at 01/18/2024 1317 Last data filed at 01/18/2024 0900 Gross per 24 hour  Intake 360 ml  Output --  Net 360 ml   Filed Weights   01/13/24 1726  Weight: 46.5 kg    Scheduled Meds:  apixaban   5 mg Oral BID   budesonide  (PULMICORT ) nebulizer solution  0.5 mg Nebulization BID   dextromethorphan -guaiFENesin   1 tablet Oral BID  feeding supplement  237 mL Oral TID BM   gatifloxacin   1 drop Left Eye QID   ipratropium-albuterol   3 mL Nebulization TID   levofloxacin   750 mg Oral Daily   levothyroxine   125 mcg Oral Q0600   magnesium  oxide  400 mg Oral Daily   methylPREDNISolone  (SOLU-MEDROL ) injection  40 mg Intravenous Q12H   metoprolol  succinate  12.5 mg Oral Daily   polyvinyl alcohol   1 drop Both Eyes QID   rosuvastatin   20 mg Oral Daily   sertraline   25 mg Oral Daily   sodium chloride  HYPERTONIC  4 mL Nebulization BID   Continuous  Infusions:  Nutritional status Signs/Symptoms: mild fat depletion, severe muscle depletion Interventions: Refer to RD note for recommendations, Ensure Enlive (each supplement provides 350kcal and 20 grams of protein), MVI Body mass index is 18.17 kg/m.  Data Reviewed:   CBC: Recent Labs  Lab 01/13/24 1755 01/14/24 0450 01/15/24 0448 01/16/24 0414 01/17/24 0526 01/18/24 0513  WBC 13.6* 8.0 16.0* 14.5* 12.6* 14.5*  NEUTROABS 11.1*  --   --   --   --   --   HGB 12.5 12.1 11.6* 11.3* 11.2* 11.3*  HCT 42.1 40.1 39.0 36.0 36.1 37.3  MCV 97.5 97.1 98.7 94.2 94.3 96.1  PLT 257 258 245 226 221 195   Basic Metabolic Panel: Recent Labs  Lab 01/14/24 0450 01/15/24 0448 01/16/24 0414 01/17/24 0526 01/18/24 0513  NA 135 139 136 137 139  K 4.4 4.7 4.0 4.0 4.3  CL 97* 101 97* 97* 98  CO2 30 31 32 32 34*  GLUCOSE 159* 100* 111* 125* 105*  BUN 16 27* 35* 26* 30*  CREATININE 0.51 0.64 0.60 0.60 0.70  CALCIUM  8.5* 9.1 8.6* 8.7* 8.7*  MG  --  2.0 1.9 1.9 2.1   GFR: Estimated Creatinine Clearance: 51.5 mL/min (by C-G formula based on SCr of 0.7 mg/dL). Liver Function Tests: No results for input(s): "AST", "ALT", "ALKPHOS", "BILITOT", "PROT", "ALBUMIN" in the last 168 hours. No results for input(s): "LIPASE", "AMYLASE" in the last 168 hours. No results for input(s): "AMMONIA" in the last 168 hours. Coagulation Profile: No results for input(s): "INR", "PROTIME" in the last 168 hours. Cardiac Enzymes: No results for input(s): "CKTOTAL", "CKMB", "CKMBINDEX", "TROPONINI" in the last 168 hours. BNP (last 3 results) No results for input(s): "PROBNP" in the last 8760 hours. HbA1C: No results for input(s): "HGBA1C" in the last 72 hours. CBG: No results for input(s): "GLUCAP" in the last 168 hours. Lipid Profile: No results for input(s): "CHOL", "HDL", "LDLCALC", "TRIG", "CHOLHDL", "LDLDIRECT" in the last 72 hours. Thyroid  Function Tests: No results for input(s): "TSH", "T4TOTAL",  "FREET4", "T3FREE", "THYROIDAB" in the last 72 hours. Anemia Panel: No results for input(s): "VITAMINB12", "FOLATE", "FERRITIN", "TIBC", "IRON ", "RETICCTPCT" in the last 72 hours. Sepsis Labs: Recent Labs  Lab 01/14/24 0450  PROCALCITON <0.10    Recent Results (from the past 240 hours)  Blood culture (routine x 2)     Status: None   Collection Time: 01/10/24  6:15 PM   Specimen: BLOOD RIGHT WRIST  Result Value Ref Range Status   Specimen Description   Final    BLOOD RIGHT WRIST Performed at [facility], [address] [place], [place] [address]    Special Requests   Final    BOTTLES DRAWN AEROBIC ONLY Blood Culture results may not be optimal due to an inadequate volume of blood received in culture bottles Performed at [facility], [address] [place], [place] [address]    Culture   Final    NO GROWTH 5 DAYS Performed at [facility], [address], [place], [place] [address]    Report Status 01/15/2024 FINAL  Final  Blood culture (routine x 2)     Status: None   Collection Time: 01/10/24  6:15 PM   Specimen: BLOOD LEFT HAND  Result Value Ref Range Status   Specimen Description   Final    BLOOD LEFT HAND Performed at [facility], [address] [place], [place] [address]    Special Requests   Final    BOTTLES DRAWN AEROBIC ONLY Blood Culture results may not be optimal due to an inadequate volume of blood received in culture bottles Performed at [facility], [address] [place], [place] [address]    Culture   Final    NO GROWTH 5 DAYS Performed at [facility], [address], [place], [place] [address]    Report Status 01/15/2024 FINAL  Final  Resp panel by RT-PCR (RSV, Flu A&B, Covid) Anterior Nasal Swab     Status: None   Collection Time: 01/10/24  7:29 PM   Specimen: Anterior Nasal Swab  Result Value Ref Range Status   SARS Coronavirus 2 by RT PCR NEGATIVE NEGATIVE Final    Comment:  (NOTE) SARS-CoV-2 target nucleic acids are NOT DETECTED.  The SARS-CoV-2 RNA is generally detectable in upper respiratory specimens during the acute phase of infection. The lowest concentration of SARS-CoV-2 viral copies this assay can detect is 138 copies/mL. A negative result does not preclude SARS-Cov-2 infection and should not be used as the sole basis for treatment or other patient management decisions. A negative result may occur with  improper specimen collection/handling, submission of specimen other than nasopharyngeal swab, presence of viral mutation(s) within the areas targeted by this assay, and inadequate number of viral copies(<138 copies/mL). A negative result must be combined with clinical observations, patient history, and epidemiological information. The expected result is Negative.  Fact Sheet for Patients:  BloggerCourse.com  Fact Sheet for Healthcare Providers:  SeriousBroker.it  This test is no t yet approved or cleared by the [place]  FDA and  has been authorized for detection and/or diagnosis of SARS-CoV-2 by FDA under an Emergency Use Authorization (EUA). This EUA will remain  in effect (meaning this test can be used) for the duration of the COVID-19 declaration under Section 564(b)(1) of the Act, 21 U.S.C.section 360bbb-3(b)(1), unless the authorization is terminated  or revoked sooner.       Influenza A by PCR NEGATIVE NEGATIVE Final   Influenza B by PCR NEGATIVE NEGATIVE Final    Comment: (NOTE) The Xpert Xpress SARS-CoV-2/FLU/RSV plus assay is intended as an aid in the diagnosis of influenza from Nasopharyngeal swab specimens and should not be used as a sole basis for treatment. Nasal washings and aspirates are unacceptable for Xpert Xpress SARS-CoV-2/FLU/RSV testing.  Fact Sheet for Patients: BloggerCourse.com  Fact Sheet for Healthcare  Providers: SeriousBroker.it  This test is not yet approved or cleared by the [place]  FDA and has been authorized for detection and/or diagnosis of SARS-CoV-2 by FDA under an Emergency Use Authorization (EUA). This EUA will remain in effect (meaning this test can be used) for the duration of the COVID-19 declaration under Section 564(b)(1) of the Act, 21 U.S.C. section 360bbb-3(b)(1), unless the authorization is terminated or revoked.     Resp Syncytial Virus by PCR  NEGATIVE NEGATIVE Final    Comment: (NOTE) Fact Sheet for Patients: BloggerCourse.com  Fact Sheet for Healthcare Providers: SeriousBroker.it  This test is not yet approved or cleared by the [place]  FDA and has been authorized for detection and/or diagnosis of SARS-CoV-2 by FDA under an Emergency Use Authorization (EUA). This EUA will remain in effect (meaning this test can be used) for the duration of the COVID-19 declaration under Section 564(b)(1) of the Act, 21 U.S.C. section 360bbb-3(b)(1), unless the authorization is terminated or revoked.  Performed at [facility], [address] [place], [place] [address]   Resp panel by RT-PCR (RSV, Flu A&B, Covid) Anterior Nasal Swab     Status: None   Collection Time: 01/13/24  5:55 PM   Specimen: Anterior Nasal Swab  Result Value Ref Range Status   SARS Coronavirus 2 by RT PCR NEGATIVE NEGATIVE Final    Comment: (NOTE) SARS-CoV-2 target nucleic acids are NOT DETECTED.  The SARS-CoV-2 RNA is generally detectable in upper respiratory specimens during the acute phase of infection. The lowest concentration of SARS-CoV-2 viral copies this assay can detect is 138 copies/mL. A negative result does not preclude SARS-Cov-2 infection and should not be used as the sole basis for treatment or other patient management decisions. A negative result may occur with  improper  specimen collection/handling, submission of specimen other than nasopharyngeal swab, presence of viral mutation(s) within the areas targeted by this assay, and inadequate number of viral copies(<138 copies/mL). A negative result must be combined with clinical observations, patient history, and epidemiological information. The expected result is Negative.  Fact Sheet for Patients:  BloggerCourse.com  Fact Sheet for Healthcare Providers:  SeriousBroker.it  This test is no t yet approved or cleared by the [place]  FDA and  has been authorized for detection and/or diagnosis of SARS-CoV-2 by FDA under an Emergency Use Authorization (EUA). This EUA will remain  in effect (meaning this test can be used) for the duration of the COVID-19 declaration under Section 564(b)(1) of the Act, 21 U.S.C.section 360bbb-3(b)(1), unless the authorization is terminated  or revoked sooner.       Influenza A by PCR NEGATIVE NEGATIVE Final   Influenza B by PCR NEGATIVE NEGATIVE Final    Comment: (NOTE) The Xpert Xpress SARS-CoV-2/FLU/RSV plus assay is intended as an aid in the diagnosis of influenza from Nasopharyngeal swab specimens and should not be used as a sole basis for treatment. Nasal washings and aspirates are unacceptable for Xpert Xpress SARS-CoV-2/FLU/RSV testing.  Fact Sheet for Patients: BloggerCourse.com  Fact Sheet for Healthcare Providers: SeriousBroker.it  This test is not yet approved or cleared by the [place]  FDA and has been authorized for detection and/or diagnosis of SARS-CoV-2 by FDA under an Emergency Use Authorization (EUA). This EUA will remain in effect (meaning this test can be used) for the duration of the COVID-19 declaration under Section 564(b)(1) of the Act, 21 U.S.C. section 360bbb-3(b)(1), unless the authorization is terminated or revoked.     Resp  Syncytial Virus by PCR NEGATIVE NEGATIVE Final    Comment: (NOTE) Fact Sheet for Patients: BloggerCourse.com  Fact Sheet for Healthcare Providers: SeriousBroker.it  This test is not yet approved or cleared by the [place]  FDA and has been authorized for detection and/or diagnosis of SARS-CoV-2 by FDA under an Emergency Use Authorization (EUA). This EUA will remain in effect (meaning this test can be used) for the duration of the COVID-19 declaration under Section 564(b)(1) of the Act, 21 U.S.C.  section 360bbb-3(b)(1), unless the authorization is terminated or revoked.  Performed at [facility], [address] [place], [place] [address]          Radiology Studies: No results found.         LOS: 5 days   Time spent= 35 mins    Maggie Schooner, MD [facility]  If 7PM-7AM, please contact night-coverage  01/18/2024, 1:17 PM

## 2024-01-18 NOTE — Plan of Care (Signed)

## 2024-01-18 NOTE — Consult Note (Signed)
 NAME:  Tracy Park, MRN:  [id number], DOB:  May 28, 1956, LOS: 5 ADMISSION DATE:  01/13/2024, CONSULTATION DATE:  01/18/24 REFERRING MD:  Rand Burrs, MD, CHIEF COMPLAINT:  COPD exacerbation   History of Present Illness:  Tracy Park is a 68 year old woman, daily smoker with COPD, OSA, paroxysmal atrial fibrillation and stage IIIA LUL lung adenocarcinoma s/p left upper lobectomy 2018 who is admitted for COPD exacerbation.  PCCM consulted for further evaluation. She is followed by the [facility] team. Last seen 11/15/23. Her outpatient regimen includes breztri  2 puffs twice daily and as needed albuterol . Ohtuvayre  nebulizer treatments were considered at last visit.   She has been started on doxycycline  since 5/6, budesonide  0.5mg  nebs twice daily, duonebs q8hrs and solumedrol 40mg  twice daily IV. She continues to have wheezing on exam which prompted consult.   CTA Chest 5/3 is negative for PE. Diffuse emphysematous changes. RUL consolidation is stable compared to 12/13/2023.   Micro review shows history of pseudomonas in her sputum in 06/2023.  Pertinent  Medical History   Past Medical History:  Diagnosis Date   Anginal pain (HCC)    Anxiety    Bipolar disorder (HCC)    Cancer (HCC)    COPD (chronic obstructive pulmonary disease) (HCC)    Dyspnea    Family history of adverse reaction to anesthesia    History of kidney stones    Hydroureteronephrosis 08/16/2021   Hypothyroidism    Lung cancer (HCC)    Myocardial infarction (HCC)    Paroxysmal atrial fibrillation (HCC)    PTSD (post-traumatic stress disorder)    Sleep apnea    Thyroid  disease      Significant Hospital Events: Including procedures, antibiotic start and stop dates in addition to other pertinent events   5/6 admitted for COPD exacerbation 5/11 PCCM consulted  Interim History / Subjective:  She reports on going wheezing since admission. Does not typically have wheezing at baseline. She reports an influenza  exposure prior to admission. She has chest congestion but having trouble coughing out mucous.  Objective    Blood pressure 127/80, pulse 65, temperature 97.9 F (36.6 C), resp. rate 18, height 5\' 3"  (1.6 m), weight 46.5 kg, SpO2 99%.    FiO2 (%):  [28 %] 28 %  No intake or output data in the 24 hours ending 01/18/24 0838 Filed Weights   01/13/24 1726  Weight: 46.5 kg    Examination: General: elderly woman, no acute distress HENT: [place]/AT, moist mucous membranes Lungs: diffuse wheezing Cardiovascular: rrr, no murmur Abdomen: soft, non-tender, non-distended, BS+ Extremities: warm, no edema Neuro: alert, moving all extremities GU: n/a  Resolved Hospital Problem list     Assessment & Plan:  Chronic Hypoxemic Respiratory Failure COPD with exacerbation Cigarette Smoker Hx of lung cancer RUL Opacity  Plan: - stop doxycycline  - start levaquin  based on history of pseudomonas in respiratory cultures from 06/2023 - obtain sputum culture - checked respiratory viral panel - start hypertonic saline nebs - flutter valve and chest PT via vest - continue IV solumedrol twice daily - continue budesonide  nebs and duonebs - will need hospital follow up with her [facility] pulmonary team  PCCM will continue to follow  Best Practice (right click and "Reselect all SmartList Selections" daily)   Per primary  Labs   CBC: Recent Labs  Lab 01/13/24 1755 01/14/24 0450 01/15/24 0448 01/16/24 0414 01/17/24 0526 01/18/24 0513  WBC 13.6* 8.0 16.0* 14.5* 12.6* 14.5*  NEUTROABS 11.1*  --   --   --   --   --  HGB 12.5 12.1 11.6* 11.3* 11.2* 11.3*  HCT 42.1 40.1 39.0 36.0 36.1 37.3  MCV 97.5 97.1 98.7 94.2 94.3 96.1  PLT 257 258 245 226 221 195    Basic Metabolic Panel: Recent Labs  Lab 01/14/24 0450 01/15/24 0448 01/16/24 0414 01/17/24 0526 01/18/24 0513  NA 135 139 136 137 139  K 4.4 4.7 4.0 4.0 4.3  CL 97* 101 97* 97* 98  CO2 30 31 32 32 34*  GLUCOSE 159* 100* 111* 125* 105*   BUN 16 27* 35* 26* 30*  CREATININE 0.51 0.64 0.60 0.60 0.70  CALCIUM  8.5* 9.1 8.6* 8.7* 8.7*  MG  --  2.0 1.9 1.9 2.1   GFR: Estimated Creatinine Clearance: 51.5 mL/min (by C-G formula based on SCr of 0.7 mg/dL). Recent Labs  Lab 01/14/24 0450 01/15/24 0448 01/16/24 0414 01/17/24 0526 01/18/24 0513  PROCALCITON <0.10  --   --   --   --   WBC 8.0 16.0* 14.5* 12.6* 14.5*    Liver Function Tests: No results for input(s): "AST", "ALT", "ALKPHOS", "BILITOT", "PROT", "ALBUMIN" in the last 168 hours. No results for input(s): "LIPASE", "AMYLASE" in the last 168 hours. No results for input(s): "AMMONIA" in the last 168 hours.  ABG    Component Value Date/Time   PHART 7.410 05/03/2021 1029   PCO2ART 44.9 05/03/2021 1029   PO2ART 43 (L) 05/03/2021 1029   HCO3 35.3 (H) 09/23/2023 0823   TCO2 36 (H) 03/26/2023 0536   O2SAT 74.8 09/23/2023 0823     Coagulation Profile: No results for input(s): "INR", "PROTIME" in the last 168 hours.  Cardiac Enzymes: No results for input(s): "CKTOTAL", "CKMB", "CKMBINDEX", "TROPONINI" in the last 168 hours.  HbA1C: Hgb A1c MFr Bld  Date/Time Value Ref Range Status  01/27/2023 03:14 AM 5.7 (H) 4.8 - 5.6 % Final    Comment:    (NOTE) Pre diabetes:          5.7%-6.4%  Diabetes:              >6.4%  Glycemic control for   <7.0% adults with diabetes   08/20/2021 02:35 AM 5.9 (H) 4.8 - 5.6 % Final    Comment:    (NOTE) Pre diabetes:          5.7%-6.4%  Diabetes:              >6.4%  Glycemic control for   <7.0% adults with diabetes     CBG: No results for input(s): "GLUCAP" in the last 168 hours.  Review of Systems:   Review of Systems  Constitutional:  Positive for malaise/fatigue. Negative for chills, fever and weight loss.  HENT:  Negative for congestion, sinus pain and sore throat.   Eyes: Negative.   Respiratory:  Positive for cough, sputum production, shortness of breath and wheezing. Negative for hemoptysis.    Cardiovascular:  Negative for chest pain, palpitations, orthopnea, claudication and leg swelling.  Gastrointestinal:  Negative for abdominal pain, heartburn, nausea and vomiting.  Genitourinary: Negative.   Musculoskeletal:  Negative for joint pain and myalgias.  Skin:  Negative for rash.  Neurological:  Negative for weakness.  Endo/Heme/Allergies: Negative.   Psychiatric/Behavioral: Negative.     Past Medical History:  She,  has a past medical history of Anginal pain (HCC), Anxiety, Bipolar disorder (HCC), Cancer (HCC), COPD (chronic obstructive pulmonary disease) (HCC), Dyspnea, Family history of adverse reaction to anesthesia, History of kidney stones, Hydroureteronephrosis (08/16/2021), Hypothyroidism, Lung cancer (HCC), Myocardial infarction [facility]), Paroxysmal atrial  fibrillation (HCC), PTSD (post-traumatic stress disorder), Sleep apnea, and Thyroid  disease.   Surgical History:   Past Surgical History:  Procedure Laterality Date   ABDOMINAL HYSTERECTOMY     BACK SURGERY     CYSTOSCOPY W/ URETERAL STENT PLACEMENT Right 05/03/2021   Procedure: CYSTOSCOPY WITH RETROGRADE PYELOGRAM/URETERAL STENT PLACEMENT;  Surgeon: Andrez Banker, MD;  Location: [facility];  Service: Urology;  Laterality: Right;   EYE SURGERY     kidney stent     thyroidectomy       Social History:   reports that she has been smoking cigarettes. She has never used smokeless tobacco. She reports that she does not currently use alcohol . She reports that she does not currently use drugs.   Family History:  Her family history includes Diabetes in her father; Heart failure in her father and mother; Hypertension in her father and mother.   Allergies Allergies  Allergen Reactions   Red Dye #40 (Allura Red) Hives, Itching and Other (See Comments)    Red food dye   Strawberry Extract Hives and Itching   Tomato Hives and Itching   Aspirin Hives   Tape Rash and Other (See Comments)    Prefers paper tape   Wound  Dressing Adhesive Rash     Home Medications  Prior to Admission medications   Medication Sig Start Date End Date Taking? Authorizing Provider  acetaminophen  (TYLENOL ) 500 MG tablet Take 500 mg by mouth every 6 (six) hours as needed for moderate pain (pain score 4-6).   Yes [provider]  albuterol  (PROVENTIL ) (2.5 MG/3ML) 0.083% nebulizer solution Take 2.5 mg by nebulization every 4 (four) hours as needed for wheezing or shortness of breath.   Yes [provider]  albuterol  (VENTOLIN  HFA) 108 (90 Base) MCG/ACT inhaler Inhale 2 puffs into the lungs every 6 (six) hours as needed for wheezing or shortness of breath. 11/07/23  Yes Amin, Ankit C, MD  apixaban  (ELIQUIS ) 5 MG TABS tablet Take 1 tablet (5 mg total) by mouth 2 (two) times daily. 06/06/23  Yes Lorita Rosa, MD  benzonatate  (TESSALON ) 100 MG capsule Take 1 capsule (100 mg total) by mouth 3 (three) times daily as needed for cough. 12/19/23  Yes Magdalene School, MD  Budeson-Glycopyrrol-Formoterol  (BREZTRI  AEROSPHERE) 160-9-4.8 MCG/ACT AERO Inhale 1 puff into the lungs in the morning and at bedtime.   Yes [provider]  gatifloxacin  (ZYMAXID ) 0.5 % SOLN Place 1 drop into the left eye 4 (four) times daily.   Yes [provider]  levothyroxine  (SYNTHROID ) 125 MCG tablet Take 125 mcg by mouth daily before breakfast.   Yes [provider]  losartan  (COZAAR ) 25 MG tablet Take 12.5 mg by mouth daily. 08/29/23  Yes [provider]  magnesium  oxide (MAG-OX) 400 (240 Mg) MG tablet Take 400 mg by mouth in the morning.   Yes [provider]  metoprolol  succinate (TOPROL -XL) 25 MG 24 hr tablet Take 12.5 mg by mouth daily.   Yes [provider]  nitroGLYCERIN  (NITROSTAT ) 0.4 MG SL tablet Place 0.4 mg under the tongue every 5 (five) minutes as needed for chest pain.   Yes [provider]  ondansetron  (ZOFRAN -ODT) 4 MG disintegrating tablet Take 1 tablet (4 mg total) by mouth  every 8 (eight) hours as needed for nausea or vomiting. 01/10/24  Yes Tegeler, Marine Sia, MD  oxyCODONE  (ROXICODONE ) 5 MG immediate release tablet Take 1 tablet (5 mg total) by mouth every 6 (six) hours as needed for severe  pain (pain score 7-10). 12/03/23  Yes Curatolo, Adam, DO  OXYGEN  Inhale 2 L/min into the lungs continuous.   Yes [provider]  polyvinyl alcohol  (LIQUIFILM TEARS) 1.4 % ophthalmic solution Place 1 drop into both eyes in the morning, at noon, in the evening, and at bedtime.   Yes [provider]  prednisoLONE  acetate (PRED FORTE ) 1 % ophthalmic suspension Place 1 drop into the left eye 4 (four) times daily. 11/19/23  Yes [provider]  rosuvastatin  (CRESTOR ) 20 MG tablet Take 20 mg by mouth daily.   Yes [provider]  sertraline  (ZOLOFT ) 25 MG tablet Take 25 mg by mouth in the morning.   Yes [provider]  SYSTANE COMPLETE PF 0.6 % SOLN Place 1 drop into both eyes 4 (four) times daily as needed.   Yes [provider]  metoprolol  succinate (TOPROL -XL) 25 MG 24 hr tablet Take 0.5 tablets (12.5 mg total) by mouth daily. Patient not taking: Reported on 01/13/2024 06/22/23 01/13/24  Verlyn Goad, MD  oxyCODONE -acetaminophen  (PERCOCET/ROXICET) 5-325 MG tablet Take 1 tablet by mouth every 6 (six) hours as needed for severe pain (pain score 7-10). Patient not taking: Reported on 01/13/2024 01/10/24   Tegeler, Marine Sia, MD     Critical care time: n/a    Duaine German, MD [facility] Office: [phone]   See Amion for personal pager PCCM on call pager [phone] until 7pm. Please call Elink 7p-7a. [phone]

## 2024-01-19 DIAGNOSIS — J9621 Acute and chronic respiratory failure with hypoxia: Secondary | ICD-10-CM

## 2024-01-19 DIAGNOSIS — J441 Chronic obstructive pulmonary disease with (acute) exacerbation: Secondary | ICD-10-CM | POA: Diagnosis not present

## 2024-01-19 DIAGNOSIS — J9622 Acute and chronic respiratory failure with hypercapnia: Secondary | ICD-10-CM

## 2024-01-19 LAB — BASIC METABOLIC PANEL WITH GFR
Anion gap: 9 (ref 5–15)
BUN: 36 mg/dL — ABNORMAL HIGH (ref 8–23)
CO2: 32 mmol/L (ref 22–32)
Calcium: 9 mg/dL (ref 8.9–10.3)
Chloride: 97 mmol/L — ABNORMAL LOW (ref 98–111)
Creatinine, Ser: 0.86 mg/dL (ref 0.44–1.00)
GFR, Estimated: >60 mL/min (ref >60)
Glucose, Bld: 141 mg/dL — ABNORMAL HIGH (ref 70–99)
Potassium: 4.3 mmol/L (ref 3.5–5.1)
Sodium: 138 mmol/L (ref 135–145)

## 2024-01-19 LAB — EXPECTORATED SPUTUM ASSESSMENT W GRAM STAIN, RFLX TO RESP C

## 2024-01-19 LAB — CBC
HCT: 36.9 % (ref 36.0–46.0)
Hemoglobin: 11.4 g/dL — ABNORMAL LOW (ref 12.0–15.0)
MCH: 29.1 pg (ref 26.0–34.0)
MCHC: 30.9 g/dL (ref 30.0–36.0)
MCV: 94.1 fL (ref 80.0–100.0)
Platelets: 208 10*3/uL (ref 150–400)
RBC: 3.92 MIL/uL (ref 3.87–5.11)
RDW: 13.9 % (ref 11.5–15.5)
WBC: 12 10*3/uL — ABNORMAL HIGH (ref 4.0–10.5)
nRBC: 0 % (ref 0.0–0.2)

## 2024-01-19 LAB — MAGNESIUM: Magnesium: 2.1 mg/dL (ref 1.7–2.4)

## 2024-01-19 MED ORDER — LEVOFLOXACIN 750 MG PO TABS
750.0000 mg | ORAL_TABLET | ORAL | Status: DC
  Administered 2024-01-21: 750 mg via ORAL
  Filled 2024-01-19: qty 1

## 2024-01-19 MED ORDER — ARFORMOTEROL TARTRATE 15 MCG/2ML IN NEBU
15.0000 ug | INHALATION_SOLUTION | Freq: Two times a day (BID) | RESPIRATORY_TRACT | Status: DC
  Administered 2024-01-19 – 2024-01-23 (×8): 15 ug via RESPIRATORY_TRACT
  Filled 2024-01-19 (×8): qty 2

## 2024-01-19 MED ORDER — REVEFENACIN 175 MCG/3ML IN SOLN
175.0000 ug | Freq: Every day | RESPIRATORY_TRACT | Status: DC
  Administered 2024-01-20 – 2024-01-23 (×4): 175 ug via RESPIRATORY_TRACT
  Filled 2024-01-19 (×4): qty 3

## 2024-01-19 MED ORDER — METHYLPREDNISOLONE SODIUM SUCC 40 MG IJ SOLR
40.0000 mg | Freq: Every day | INTRAMUSCULAR | Status: DC
Start: 2024-01-19 — End: 2024-01-20
  Administered 2024-01-19 – 2024-01-20 (×2): 40 mg via INTRAVENOUS
  Filled 2024-01-19 (×2): qty 1

## 2024-01-19 NOTE — Progress Notes (Signed)
 PHARMACY NOTE:  ANTIMICROBIAL RENAL DOSAGE ADJUSTMENT  Current antimicrobial regimen includes a mismatch between antimicrobial dosage and estimated renal function.  As per policy approved by the Pharmacy & Therapeutics and Medical Executive Committees, the antimicrobial dosage will be adjusted accordingly.  Current antimicrobial dosage:  levaquin  750 mg daily x7 days   Indication: PNA  Renal Function:  Estimated Creatinine Clearance: 47.9 mL/min (by C-G formula based on SCr of 0.86 mg/dL). []      On intermittent HD, scheduled: []      On CRRT    Antimicrobial dosage has been changed to:  Levaquin  750 mg q48h    Thank you for allowing pharmacy to be a part of this patient's care.  Spurgeon Dyer, [facility] 01/19/2024 1:00 PM

## 2024-01-19 NOTE — Progress Notes (Signed)
 NAME:  Tracy Park, MRN:  [id number], DOB:  [birth year], LOS: 6 ADMISSION DATE:  01/13/2024, CONSULTATION DATE:  01/18/24 REFERRING MD:  Rand Burrs, MD, CHIEF COMPLAINT:  COPD exacerbation   History of Present Illness:  Tracy Park is a 68 year old woman, daily smoker with COPD, OSA, paroxysmal atrial fibrillation and stage IIIA LUL lung adenocarcinoma s/p left upper lobectomy 2018 who is admitted for COPD exacerbation.  PCCM consulted for further evaluation. She is followed by the [facility] Pulmonary team. Last seen 11/15/23. Her outpatient regimen includes breztri  2 puffs twice daily and as needed albuterol . Ohtuvayre  nebulizer treatments were considered at last visit.   She has been started on doxycycline  since 5/6, budesonide  0.5mg  nebs twice daily, duonebs q8hrs and solumedrol 40mg  twice daily IV. She continues to have wheezing on exam which prompted consult.   CTA Chest 5/3 is negative for PE. Diffuse emphysematous changes. RUL consolidation is stable compared to 12/13/2023.   Micro review shows history of pseudomonas in her sputum in 06/2023.  Pertinent  Medical History   Past Medical History:  Diagnosis Date   Anginal pain (HCC)    Anxiety    Bipolar disorder (HCC)    Cancer (HCC)    COPD (chronic obstructive pulmonary disease) (HCC)    Dyspnea    Family history of adverse reaction to anesthesia    History of kidney stones    Hydroureteronephrosis 08/16/2021   Hypothyroidism    Lung cancer (HCC)    Myocardial infarction (HCC)    Paroxysmal atrial fibrillation (HCC)    PTSD (post-traumatic stress disorder)    Sleep apnea    Thyroid  disease      Significant Hospital Events: Including procedures, antibiotic start and stop dates in addition to other pertinent events   5/6 admitted for COPD exacerbation 5/11 PCCM consulted  Interim History / Subjective:  Feels ok, still wheezing. Hasn't eben up walking much.  Objective    Blood pressure 126/81, pulse 69, temperature  97.9 F (36.6 C), temperature source Oral, resp. rate 18, height 5\' 3"  (1.6 m), weight 46.5 kg, SpO2 97%.       No intake or output data in the 24 hours ending 01/19/24 1009 Filed Weights   01/13/24 1726  Weight: 46.5 kg    Examination: General: chronically ill appearing woman lying in bed in NAD HENT: /AT, eyes anicteric Lungs: Expiratory wheezing bilaterally, no conversational dyspnea.  No rhonchi. Cardiovascular: S1-S2, regular rate and rhythm Abdomen: soft, non-tender, non-distended Extremities: No cyanosis Neuro: Awake and alert, answering questions appropriately    Sputum culture: pending RVP negative  WBC 12  CXR personally reviewed> volume loss and apical scarring LUL, no lobar opacities   CT chest personally reviewed> no PE, no significant adenopathy. Posterior RUL nodule in an area with emphysema. LUL fibrotic changes.   Assessment & Plan:  Chronic hypoxemic & hypercapnic respiratory failure, 2L baseline O2 COPD with acute exacerbation Cigarette smoker; has been cutting back on her own  Hx of lung cancer RUL small nodule on CT- appears similar to fibrotic changes in left upper lobe   Plan: -cont' levofloxacin ; if pseudomonas would potentially need treatment for 2 weeks since clinically she seems to have resolved her previous infection (no baseline sputum production) -con't aggressive pulmonary hygiene regimen- vest CPT, 3% saline nebs + bronchodilators. Long-term may do better off ICS if recurrent pneumonias become a problem.  -decrease solumedrol to once daily - brovana , yupelri , pulmicort  + PRN duonebs - Will need hospital follow  up with her [facility] pulmonary team; if RUL opacity does not resolve, will need bronchoscopy.  -Recommend full smoking cessation; she has already been working on this and seems motivated  PCCM will continue to follow.  Best Practice (right click and "Reselect all SmartList Selections" daily)   Per primary  Labs   CBC: Recent  Labs  Lab 01/13/24 1755 01/14/24 0450 01/15/24 0448 01/16/24 0414 01/17/24 0526 01/18/24 0513 01/19/24 0414  WBC 13.6*   < > 16.0* 14.5* 12.6* 14.5* 12.0*  NEUTROABS 11.1*  --   --   --   --   --   --   HGB 12.5   < > 11.6* 11.3* 11.2* 11.3* 11.4*  HCT 42.1   < > 39.0 36.0 36.1 37.3 36.9  MCV 97.5   < > 98.7 94.2 94.3 96.1 94.1  PLT 257   < > 245 226 221 195 208   < > = values in this interval not displayed.    Basic Metabolic Panel: Recent Labs  Lab 01/15/24 0448 01/16/24 0414 01/17/24 0526 01/18/24 0513 01/19/24 0414  NA 139 136 137 139 138  K 4.7 4.0 4.0 4.3 4.3  CL 101 97* 97* 98 97*  CO2 31 32 32 34* 32  GLUCOSE 100* 111* 125* 105* 141*  BUN 27* 35* 26* 30* 36*  CREATININE 0.64 0.60 0.60 0.70 0.86  CALCIUM  9.1 8.6* 8.7* 8.7* 9.0  MG 2.0 1.9 1.9 2.1 2.1   GFR: Estimated Creatinine Clearance: 47.9 mL/min (by C-G formula based on SCr of 0.86 mg/dL). Recent Labs  Lab 01/14/24 0450 01/15/24 0448 01/16/24 0414 01/17/24 0526 01/18/24 0513 01/19/24 0414  PROCALCITON <0.10  --   --   --   --   --   WBC 8.0   < > 14.5* 12.6* 14.5* 12.0*   < > = values in this interval not displayed.    Critical care time: n/a    Joesph Mussel, DO 01/19/24 10:42 AM [facility]  For contact information, see Amion. If no response to pager, please call PCCM consult pager. After hours, 7PM- 7AM, please call Elink.

## 2024-01-19 NOTE — Progress Notes (Signed)
 PROGRESS NOTE    Tracy Park  XBJ:[id number] DOB: 07-02-1956 DOA: 01/13/2024 PCP: Chana Comas, FNP    Brief Narrative:  36 with history of COPD/emphysema, chronic hypoxia 2 L nasal cannula, paroxysmal A-fib on Eliquis , lung cancer status post left lobectomy, HTN, HLD, hypothyroidism, depression recently discharged on 4/30 after being treated for COPD exacerbation now presents back to the hospital with shortness of breath and wheezing.  Upon admission CT angio was negative for PE but shows chronic lung changes.  Chest x-ray shows emphysema, started on steroids and bronchodilators   Assessment & Plan:  Principal Problem:   COPD with acute exacerbation and acute on chronic respiratory failure with hypoxia   Acute respiratory distress Acute COPD exacerbation Chronic hypoxia 2 L nasal cannula - Steroids, bronchodilators, I-S/flutter valve.  Continues to have diffuse wheezing. Sloe to improve, will discuss with Pulm to see if we need to alter any management.  Hypertonic saline - BNP and procalcitonin are normal, respiratory panel negative - Currently on Levaquin   Paroxysmal atrial fibrillation - Continue Toprol  and Eliquis .  IV as needed  Essential hypertension - Metoprolol  and losartan .  IV as needed  Hypothyroidism - Synthroid   Chronic tobacco use - Counseled to quit using this.  Nicotine  as needed  Hypokalemia - As needed repletion   DVT prophylaxis: SCDs Start: 01/13/24 2248 apixaban  (ELIQUIS ) tablet 5 mg      Code Status: Limited: Do not attempt resuscitation (DNR) -DNR-LIMITED -Do Not Intubate/DNI  Family Communication:   Status is: Inpatient Still significant wheezing and shortness of breath.  Hopefully discharge in next 24 hours   Subjective: Breathing is slightly better this morning. Examination:  General exam: Appears calm and comfortable  Respiratory system: Bilateral diffuse expiratory wheezing Cardiovascular system: S1 & S2 heard, RRR. No JVD, murmurs,  rubs, gallops or clicks. No pedal edema. Gastrointestinal system: Abdomen is nondistended, soft and nontender. No organomegaly or masses felt. Normal bowel sounds heard. Central nervous system: Alert and oriented. No focal neurological deficits. Extremities: Symmetric 5 x 5 power. Skin: No rashes, lesions or ulcers Psychiatry: Judgement and insight appear normal. Mood & affect appropriate.                Diet Orders (From admission, onward)     Start     Ordered   01/13/24 2250  Diet 2 gram sodium Room service appropriate? Yes; Fluid consistency: Thin  Diet effective now       Question Answer Comment  Room service appropriate? Yes   Fluid consistency: Thin      01/13/24 2251            Objective: Vitals:   01/18/24 2103 01/19/24 0420 01/19/24 0919 01/19/24 0953  BP:  126/81  126/81  Pulse:  69  69  Resp:  18    Temp:  97.9 F (36.6 C)    TempSrc:  Oral    SpO2: 98% 99% 97%   Weight:      Height:       No intake or output data in the 24 hours ending 01/19/24 1331 Filed Weights   01/13/24 1726  Weight: 46.5 kg    Scheduled Meds:  apixaban   5 mg Oral BID   arformoterol   15 mcg Nebulization BID   budesonide  (PULMICORT ) nebulizer solution  0.5 mg Nebulization BID   dextromethorphan -guaiFENesin   1 tablet Oral BID   feeding supplement  237 mL Oral TID BM   gatifloxacin   1 drop Left Eye QID   [START  ON 01/21/2024] levofloxacin   750 mg Oral Q48H   levothyroxine   125 mcg Oral Q0600   magnesium  oxide  400 mg Oral Daily   methylPREDNISolone  (SOLU-MEDROL ) injection  40 mg Intravenous Daily   metoprolol  succinate  12.5 mg Oral Daily   polyvinyl alcohol   1 drop Both Eyes QID   revefenacin   175 mcg Nebulization Daily   rosuvastatin   20 mg Oral Daily   sertraline   25 mg Oral Daily   sodium chloride  HYPERTONIC  4 mL Nebulization BID   Continuous Infusions:  Nutritional status Signs/Symptoms: mild fat depletion, severe muscle depletion Interventions: Refer to  RD note for recommendations, Ensure Enlive (each supplement provides 350kcal and 20 grams of protein), MVI Body mass index is 18.17 kg/m.  Data Reviewed:   CBC: Recent Labs  Lab 01/13/24 1755 01/14/24 0450 01/15/24 0448 01/16/24 0414 01/17/24 0526 01/18/24 0513 01/19/24 0414  WBC 13.6*   < > 16.0* 14.5* 12.6* 14.5* 12.0*  NEUTROABS 11.1*  --   --   --   --   --   --   HGB 12.5   < > 11.6* 11.3* 11.2* 11.3* 11.4*  HCT 42.1   < > 39.0 36.0 36.1 37.3 36.9  MCV 97.5   < > 98.7 94.2 94.3 96.1 94.1  PLT 257   < > 245 226 221 195 208   < > = values in this interval not displayed.   Basic Metabolic Panel: Recent Labs  Lab 01/15/24 0448 01/16/24 0414 01/17/24 0526 01/18/24 0513 01/19/24 0414  NA 139 136 137 139 138  K 4.7 4.0 4.0 4.3 4.3  CL 101 97* 97* 98 97*  CO2 31 32 32 34* 32  GLUCOSE 100* 111* 125* 105* 141*  BUN 27* 35* 26* 30* 36*  CREATININE 0.64 0.60 0.60 0.70 0.86  CALCIUM  9.1 8.6* 8.7* 8.7* 9.0  MG 2.0 1.9 1.9 2.1 2.1   GFR: Estimated Creatinine Clearance: 47.9 mL/min (by C-G formula based on SCr of 0.86 mg/dL). Liver Function Tests: No results for input(s): "AST", "ALT", "ALKPHOS", "BILITOT", "PROT", "ALBUMIN" in the last 168 hours. No results for input(s): "LIPASE", "AMYLASE" in the last 168 hours. No results for input(s): "AMMONIA" in the last 168 hours. Coagulation Profile: No results for input(s): "INR", "PROTIME" in the last 168 hours. Cardiac Enzymes: No results for input(s): "CKTOTAL", "CKMB", "CKMBINDEX", "TROPONINI" in the last 168 hours. BNP (last 3 results) No results for input(s): "PROBNP" in the last 8760 hours. HbA1C: No results for input(s): "HGBA1C" in the last 72 hours. CBG: No results for input(s): "GLUCAP" in the last 168 hours. Lipid Profile: No results for input(s): "CHOL", "HDL", "LDLCALC", "TRIG", "CHOLHDL", "LDLDIRECT" in the last 72 hours. Thyroid  Function Tests: No results for input(s): "TSH", "T4TOTAL", "FREET4", "T3FREE",  "THYROIDAB" in the last 72 hours. Anemia Panel: No results for input(s): "VITAMINB12", "FOLATE", "FERRITIN", "TIBC", "IRON ", "RETICCTPCT" in the last 72 hours. Sepsis Labs: Recent Labs  Lab 01/14/24 0450  PROCALCITON <0.10    Recent Results (from the past 240 hours)  Blood culture (routine x 2)     Status: None   Collection Time: 01/10/24  6:15 PM   Specimen: BLOOD RIGHT WRIST  Result Value Ref Range Status   Specimen Description   Final    BLOOD RIGHT WRIST Performed at [facility], [address] [place], [place] [address]    Special Requests   Final    BOTTLES DRAWN AEROBIC ONLY Blood Culture results may not be optimal due  to an inadequate volume of blood received in culture bottles Performed at [facility], [address] [place], [place] [address]    Culture   Final    NO GROWTH 5 DAYS Performed at [facility], [address], [place], [place] [address]    Report Status 01/15/2024 FINAL  Final  Blood culture (routine x 2)     Status: None   Collection Time: 01/10/24  6:15 PM   Specimen: BLOOD LEFT HAND  Result Value Ref Range Status   Specimen Description   Final    BLOOD LEFT HAND Performed at [facility], [address], [place], [place] [address]    Special Requests   Final    BOTTLES DRAWN AEROBIC ONLY Blood Culture results may not be optimal due to an inadequate volume of blood received in culture bottles Performed at [facility], [address] [place], [place] [address]    Culture   Final    NO GROWTH 5 DAYS Performed at [facility], [address], [place], [place] [address]    Report Status 01/15/2024 FINAL  Final  Resp panel by RT-PCR (RSV, Flu A&B, Covid) Anterior Nasal Swab     Status: None   Collection Time: 01/10/24  7:29 PM   Specimen: Anterior Nasal Swab  Result Value Ref Range Status   SARS Coronavirus 2 by RT PCR NEGATIVE NEGATIVE Final    Comment: (NOTE) SARS-CoV-2 target  nucleic acids are NOT DETECTED.  The SARS-CoV-2 RNA is generally detectable in upper respiratory specimens during the acute phase of infection. The lowest concentration of SARS-CoV-2 viral copies this assay can detect is 138 copies/mL. A negative result does not preclude SARS-Cov-2 infection and should not be used as the sole basis for treatment or other patient management decisions. A negative result may occur with  improper specimen collection/handling, submission of specimen other than nasopharyngeal swab, presence of viral mutation(s) within the areas targeted by this assay, and inadequate number of viral copies(<138 copies/mL). A negative result must be combined with clinical observations, patient history, and epidemiological information. The expected result is Negative.  Fact Sheet for Patients:  BloggerCourse.com  Fact Sheet for Healthcare Providers:  SeriousBroker.it  This test is no t yet approved or cleared by the [place]  FDA and  has been authorized for detection and/or diagnosis of SARS-CoV-2 by FDA under an Emergency Use Authorization (EUA). This EUA will remain  in effect (meaning this test can be used) for the duration of the COVID-19 declaration under Section 564(b)(1) of the Act, 21 U.S.C.section 360bbb-3(b)(1), unless the authorization is terminated  or revoked sooner.       Influenza A by PCR NEGATIVE NEGATIVE Final   Influenza B by PCR NEGATIVE NEGATIVE Final    Comment: (NOTE) The Xpert Xpress SARS-CoV-2/FLU/RSV plus assay is intended as an aid in the diagnosis of influenza from Nasopharyngeal swab specimens and should not be used as a sole basis for treatment. Nasal washings and aspirates are unacceptable for Xpert Xpress SARS-CoV-2/FLU/RSV testing.  Fact Sheet for Patients: BloggerCourse.com  Fact Sheet for Healthcare  Providers: SeriousBroker.it  This test is not yet approved or cleared by the [place]  FDA and has been authorized for detection and/or diagnosis of SARS-CoV-2 by FDA under an Emergency Use Authorization (EUA). This EUA will remain in effect (meaning this test can be used) for the duration of the COVID-19 declaration under Section 564(b)(1) of the Act, 21 U.S.C. section  360bbb-3(b)(1), unless the authorization is terminated or revoked.     Resp Syncytial Virus by PCR NEGATIVE NEGATIVE Final    Comment: (NOTE) Fact Sheet for Patients: BloggerCourse.com  Fact Sheet for Healthcare Providers: SeriousBroker.it  This test is not yet approved or cleared by the [place]  FDA and has been authorized for detection and/or diagnosis of SARS-CoV-2 by FDA under an Emergency Use Authorization (EUA). This EUA will remain in effect (meaning this test can be used) for the duration of the COVID-19 declaration under Section 564(b)(1) of the Act, 21 U.S.C. section 360bbb-3(b)(1), unless the authorization is terminated or revoked.  Performed at [facility], [address] [place], [place] [address]   Resp panel by RT-PCR (RSV, Flu A&B, Covid) Anterior Nasal Swab     Status: None   Collection Time: 01/13/24  5:55 PM   Specimen: Anterior Nasal Swab  Result Value Ref Range Status   SARS Coronavirus 2 by RT PCR NEGATIVE NEGATIVE Final    Comment: (NOTE) SARS-CoV-2 target nucleic acids are NOT DETECTED.  The SARS-CoV-2 RNA is generally detectable in upper respiratory specimens during the acute phase of infection. The lowest concentration of SARS-CoV-2 viral copies this assay can detect is 138 copies/mL. A negative result does not preclude SARS-Cov-2 infection and should not be used as the sole basis for treatment or other patient management decisions. A negative result may occur with  improper  specimen collection/handling, submission of specimen other than nasopharyngeal swab, presence of viral mutation(s) within the areas targeted by this assay, and inadequate number of viral copies(<138 copies/mL). A negative result must be combined with clinical observations, patient history, and epidemiological information. The expected result is Negative.  Fact Sheet for Patients:  BloggerCourse.com  Fact Sheet for Healthcare Providers:  SeriousBroker.it  This test is no t yet approved or cleared by the [place]  FDA and  has been authorized for detection and/or diagnosis of SARS-CoV-2 by FDA under an Emergency Use Authorization (EUA). This EUA will remain  in effect (meaning this test can be used) for the duration of the COVID-19 declaration under Section 564(b)(1) of the Act, 21 U.S.C.section 360bbb-3(b)(1), unless the authorization is terminated  or revoked sooner.       Influenza A by PCR NEGATIVE NEGATIVE Final   Influenza B by PCR NEGATIVE NEGATIVE Final    Comment: (NOTE) The Xpert Xpress SARS-CoV-2/FLU/RSV plus assay is intended as an aid in the diagnosis of influenza from Nasopharyngeal swab specimens and should not be used as a sole basis for treatment. Nasal washings and aspirates are unacceptable for Xpert Xpress SARS-CoV-2/FLU/RSV testing.  Fact Sheet for Patients: BloggerCourse.com  Fact Sheet for Healthcare Providers: SeriousBroker.it  This test is not yet approved or cleared by the [place]  FDA and has been authorized for detection and/or diagnosis of SARS-CoV-2 by FDA under an Emergency Use Authorization (EUA). This EUA will remain in effect (meaning this test can be used) for the duration of the COVID-19 declaration under Section 564(b)(1) of the Act, 21 U.S.C. section 360bbb-3(b)(1), unless the authorization is terminated or revoked.     Resp  Syncytial Virus by PCR NEGATIVE NEGATIVE Final    Comment: (NOTE) Fact Sheet for Patients: BloggerCourse.com  Fact Sheet for Healthcare Providers: SeriousBroker.it  This test is not yet approved or cleared by the [place]  FDA and has been authorized for detection and/or diagnosis of SARS-CoV-2 by FDA under an Emergency Use Authorization (EUA). This EUA will remain in effect (meaning this test can  be used) for the duration of the COVID-19 declaration under Section 564(b)(1) of the Act, 21 U.S.C. section 360bbb-3(b)(1), unless the authorization is terminated or revoked.  Performed at [facility], [address], [place], [place] [address]   Respiratory (~20 pathogens) panel by PCR     Status: None   Collection Time: 01/18/24 12:57 PM   Specimen: Nasopharyngeal Swab; Respiratory  Result Value Ref Range Status   Adenovirus NOT DETECTED NOT DETECTED Final   Coronavirus 229E NOT DETECTED NOT DETECTED Final    Comment: (NOTE) The Coronavirus on the Respiratory Panel, DOES NOT test for the novel  Coronavirus (2019 nCoV)    Coronavirus HKU1 NOT DETECTED NOT DETECTED Final   Coronavirus NL63 NOT DETECTED NOT DETECTED Final   Coronavirus OC43 NOT DETECTED NOT DETECTED Final   Metapneumovirus NOT DETECTED NOT DETECTED Final   Rhinovirus / Enterovirus NOT DETECTED NOT DETECTED Final   Influenza A NOT DETECTED NOT DETECTED Final   Influenza B NOT DETECTED NOT DETECTED Final   Parainfluenza Virus 1 NOT DETECTED NOT DETECTED Final   Parainfluenza Virus 2 NOT DETECTED NOT DETECTED Final   Parainfluenza Virus 3 NOT DETECTED NOT DETECTED Final   Parainfluenza Virus 4 NOT DETECTED NOT DETECTED Final   Respiratory Syncytial Virus NOT DETECTED NOT DETECTED Final   Bordetella pertussis NOT DETECTED NOT DETECTED Final   Bordetella Parapertussis NOT DETECTED NOT DETECTED Final   Chlamydophila pneumoniae NOT DETECTED NOT  DETECTED Final   Mycoplasma pneumoniae NOT DETECTED NOT DETECTED Final    Comment: Performed at [facility], [address], [place], [place] [address]  Expectorated Sputum Assessment w Gram Stain, Rflx to Resp Cult     Status: None   Collection Time: 01/18/24  2:40 PM   Specimen: Expectorated Sputum  Result Value Ref Range Status   Specimen Description EXPECTORATED SPUTUM  Final   Special Requests NONE  Final   Sputum evaluation   Final    Sputum specimen not acceptable for testing.  Please recollect.   CRITICAL RESULT CALLED TO, READ BACK BY AND VERIFIED WITH: Joie Narrow RN @ [phone] 01/18/24. GILBERTL Performed at [facility], [address], [place], [place] [address]    Report Status 01/18/2024 FINAL  Final  Expectorated Sputum Assessment w Gram Stain, Rflx to Resp Cult     Status: None   Collection Time: 01/19/24  8:50 AM  Result Value Ref Range Status   Specimen Description EXPECTORATED SPUTUM  Final   Special Requests NONE  Final   Sputum evaluation   Final    THIS SPECIMEN IS ACCEPTABLE FOR SPUTUM CULTURE Performed at [facility], [address], [place], [place] [address]    Report Status 01/19/2024 FINAL  Final  Culture, Respiratory w Gram Stain     Status: None (Preliminary result)   Collection Time: 01/19/24  8:50 AM  Result Value Ref Range Status   Specimen Description   Final    EXPECTORATED SPUTUM Performed at [facility], [address] [place], [place] [address]    Special Requests   Final    NONE Reflexed from [id number] Performed at [facility], [address] [place], [place] [address]    Gram Stain   Final    NO WBC SEEN RARE GRAM POSITIVE COCCI IN PAIRS RARE GRAM POSITIVE RODS RARE GRAM NEGATIVE RODS Performed at [facility], [address], [place], [place] [address]  Culture PENDING  Incomplete   Report Status PENDING  Incomplete         Radiology  Studies: No results found.         LOS: 6 days   Time spent= 35 mins    Maggie Schooner, MD [facility]  If 7PM-7AM, please contact night-coverage  01/19/2024, 1:31 PM

## 2024-01-19 NOTE — Progress Notes (Signed)
 Mobility Specialist - Progress Note   01/19/24 1530  Mobility  Activity Ambulated independently in hallway  Level of Assistance Modified independent, requires aide device or extra time  Assistive Device None  Distance Ambulated (ft) 160 ft  Range of Motion/Exercises Active  Activity Response Tolerated well  Mobility Referral Yes  Mobility visit 1 Mobility  Mobility Specialist Start Time (ACUTE ONLY) 1515  Mobility Specialist Stop Time (ACUTE ONLY) 1530  Mobility Specialist Time Calculation (min) (ACUTE ONLY) 15 min   Pt was found in bed and agreeable to ambulate. C/o right sided pain. At EOS returned to bed with all needs met. Call bell in reach and RN notified pt request pain medication.  Lorna Rose Mobility Specialist

## 2024-01-19 NOTE — Plan of Care (Signed)

## 2024-01-19 NOTE — Progress Notes (Signed)
 PT requested that RT discontinue CPT due to RT sided rib pain. RT encouraged DB&C and / or Flutter PT states pain is too severe at this time.

## 2024-01-20 DIAGNOSIS — J441 Chronic obstructive pulmonary disease with (acute) exacerbation: Secondary | ICD-10-CM | POA: Diagnosis not present

## 2024-01-20 LAB — CBC
HCT: 39.3 % (ref 36.0–46.0)
Hemoglobin: 12.3 g/dL (ref 12.0–15.0)
MCH: 29.3 pg (ref 26.0–34.0)
MCHC: 31.3 g/dL (ref 30.0–36.0)
MCV: 93.6 fL (ref 80.0–100.0)
Platelets: 210 10*3/uL (ref 150–400)
RBC: 4.2 MIL/uL (ref 3.87–5.11)
RDW: 14 % (ref 11.5–15.5)
WBC: 13.4 10*3/uL — ABNORMAL HIGH (ref 4.0–10.5)
nRBC: 0 % (ref 0.0–0.2)

## 2024-01-20 LAB — BASIC METABOLIC PANEL WITH GFR
Anion gap: 7 (ref 5–15)
BUN: 32 mg/dL — ABNORMAL HIGH (ref 8–23)
CO2: 32 mmol/L (ref 22–32)
Calcium: 9 mg/dL (ref 8.9–10.3)
Chloride: 99 mmol/L (ref 98–111)
Creatinine, Ser: 0.69 mg/dL (ref 0.44–1.00)
GFR, Estimated: >60 mL/min (ref >60)
Glucose, Bld: 98 mg/dL (ref 70–99)
Potassium: 4.2 mmol/L (ref 3.5–5.1)
Sodium: 138 mmol/L (ref 135–145)

## 2024-01-20 LAB — MAGNESIUM: Magnesium: 2.2 mg/dL (ref 1.7–2.4)

## 2024-01-20 MED ORDER — PREDNISONE 20 MG PO TABS
40.0000 mg | ORAL_TABLET | Freq: Every day | ORAL | Status: DC
  Administered 2024-01-21 – 2024-01-23 (×3): 40 mg via ORAL
  Filled 2024-01-20 (×4): qty 2

## 2024-01-20 NOTE — TOC Progression Note (Signed)
 Transition of Care [facility]) - Progression Note    Patient Details  Name: LATHA CRAVENER MRN: [id number] Date of Birth: Mar 22, 1956  Transition of [facility] CM/SW Contact  Kathryn Parish, RN Phone Number: 01/20/2024, 9:22 AM  Clinical Narrative:     No TOC needs identified.  Expected Discharge Plan: Home/Self Care Barriers to Discharge: Continued Medical Work up  Expected Discharge Plan and Services In-house Referral: NA Discharge Planning Services: CM Consult Post Acute Care Choice: NA                   DME Arranged: N/A DME Agency: NA       HH Arranged: NA HH Agency: NA         Social Determinants of Health (SDOH) Interventions SDOH Screenings   Food Insecurity: No Food Insecurity (01/14/2024)  Housing: Low Risk  (01/14/2024)  Transportation Needs: No Transportation Needs (01/14/2024)  Utilities: Not At Risk (01/14/2024)  Financial Resource Strain: High Risk (10/08/2023)   Received from [facility]  Physical Activity: Unknown (04/07/2023)   Received from [facility]  Social Connections: Moderately Integrated (01/14/2024)  Recent Concern: Social Connections - Socially Isolated (12/30/2023)  Stress: Stress Concern Present (04/07/2023)   Received from [facility]  Tobacco Use: High Risk (01/13/2024)    Readmission Risk Interventions    01/14/2024    2:11 PM 01/05/2024   12:08 PM 12/30/2023   11:48 AM  Readmission Risk Prevention Plan  Transportation Screening Complete Complete Complete  Medication Review Oceanographer) Complete Complete Complete  PCP or Specialist appointment within 3-5 days of discharge Complete Complete Complete  HRI or Home Care Consult -- Complete Complete  SW Recovery Care/Counseling Consult -- Complete Complete  Palliative Care Screening Not Applicable Not Applicable Not Applicable  Skilled Nursing Facility Not Applicable Not Applicable Not Applicable

## 2024-01-20 NOTE — Progress Notes (Signed)
 NAME:  Tracy Park, MRN:  [id number], DOB:  [birth year], LOS: 7 ADMISSION DATE:  01/13/2024, CONSULTATION DATE:  01/18/24 REFERRING MD:  Rand Burrs, MD, CHIEF COMPLAINT:  COPD exacerbation   History of Present Illness:  Tracy Park is a 68 year old woman, daily smoker with COPD, OSA, paroxysmal atrial fibrillation and stage IIIA LUL lung adenocarcinoma s/p left upper lobectomy 2018 who is admitted for COPD exacerbation.  PCCM consulted for further evaluation. She is followed by the [facility] Pulmonary team. Last seen 11/15/23. Her outpatient regimen includes breztri  2 puffs twice daily and as needed albuterol . Ohtuvayre  nebulizer treatments were considered at last visit.   She has been started on doxycycline  since 5/6, budesonide  0.5mg  nebs twice daily, duonebs q8hrs and solumedrol 40mg  twice daily IV. She continues to have wheezing on exam which prompted consult.   CTA Chest 5/3 is negative for PE. Diffuse emphysematous changes. RUL consolidation is stable compared to 12/13/2023.   Micro review shows history of pseudomonas in her sputum in 06/2023.  Pertinent  Medical History   Past Medical History:  Diagnosis Date   Anginal pain (HCC)    Anxiety    Bipolar disorder (HCC)    Cancer (HCC)    COPD (chronic obstructive pulmonary disease) (HCC)    Dyspnea    Family history of adverse reaction to anesthesia    History of kidney stones    Hydroureteronephrosis 08/16/2021   Hypothyroidism    Lung cancer (HCC)    Myocardial infarction (HCC)    Paroxysmal atrial fibrillation (HCC)    PTSD (post-traumatic stress disorder)    Sleep apnea    Thyroid  disease      Significant Hospital Events: Including procedures, antibiotic start and stop dates in addition to other pertinent events   5/6 admitted for COPD exacerbation 5/11 PCCM consulted  Interim History / Subjective:  Still feeling bad same-not back to baseline yet.  She has been walking around some.  Objective    Blood pressure  129/81, pulse 71, temperature 98.3 F (36.8 C), temperature source Oral, resp. rate 18, height 5\' 3"  (1.6 m), weight 46.5 kg, SpO2 100%.       No intake or output data in the 24 hours ending 01/20/24 0743 Filed Weights   01/13/24 1726  Weight: 46.5 kg    Examination: General: Elderly woman sitting up in bed no acute distress HENT: AT, eyes anicteric Lungs: Breathing comfortably on nasal cannula, bilateral expiratory wheezing.  Rales left upper.  No conversational dyspnea. Cardiovascular: S1-S2, regular rate and rhythm Abdomen: Soft, not tender, nondistended Extremities: No cyanosis Neuro: Awake, alert, answer questions appropriately, moving all extremities    Sputum culture: rare GPC, GPR, GNR  WBC 13.4  CXR personally reviewed> volume loss and apical scarring LUL, no lobar opacities   CT chest personally reviewed> no PE, no significant adenopathy. Posterior RUL nodule in an area with emphysema. LUL fibrotic changes.   Assessment & Plan:  Chronic hypoxemic & hypercapnic respiratory failure, 2L baseline O2 COPD with acute exacerbation Cigarette smoker; has been cutting back on her own  Hx of lung cancer, s/p lobectomy RUL small nodule on CT- appears similar to fibrotic changes in left upper lobe   Plan: - Continue high-dose levofloxacin .  If Pseudomonas confirmed would potentially need treatment for 2 weeks with test for resolution.  Based on her symptoms it seems that she clinically resolved her previous infection (no baseline sputum production)  - Continue aggressive pulmonary hygiene-hypertonic saline, chest PT, bronchodilators. Triple  inhaled therapy-Brovana , Yupelri , Pulmicort .  As needed DuoNebs. -Need to consider if long-term ICS is in her best interest if she has recurrent pulmonary infections. - Daily steroids-transition to prednisone  tomorrow - Will need hospital follow up with her [facility] pulmonary team; if RUL opacity does not resolve, will need bronchoscopy.   -Recommend full smoking cessation; she has already been working on this and seems motivated.   PCCM will continue to follow.  Best Practice (right click and "Reselect all SmartList Selections" daily)   Per primary  Labs   CBC: Recent Labs  Lab 01/13/24 1755 01/14/24 0450 01/16/24 0414 01/17/24 0526 01/18/24 0513 01/19/24 0414 01/20/24 0429  WBC 13.6*   < > 14.5* 12.6* 14.5* 12.0* 13.4*  NEUTROABS 11.1*  --   --   --   --   --   --   HGB 12.5   < > 11.3* 11.2* 11.3* 11.4* 12.3  HCT 42.1   < > 36.0 36.1 37.3 36.9 39.3  MCV 97.5   < > 94.2 94.3 96.1 94.1 93.6  PLT 257   < > 226 221 195 208 210   < > = values in this interval not displayed.    Basic Metabolic Panel: Recent Labs  Lab 01/16/24 0414 01/17/24 0526 01/18/24 0513 01/19/24 0414 01/20/24 0429  NA 136 137 139 138 138  K 4.0 4.0 4.3 4.3 4.2  CL 97* 97* 98 97* 99  CO2 32 32 34* 32 32  GLUCOSE 111* 125* 105* 141* 98  BUN 35* 26* 30* 36* 32*  CREATININE 0.60 0.60 0.70 0.86 0.69  CALCIUM  8.6* 8.7* 8.7* 9.0 9.0  MG 1.9 1.9 2.1 2.1 2.2   GFR: Estimated Creatinine Clearance: 51.5 mL/min (by C-G formula based on SCr of 0.69 mg/dL). Recent Labs  Lab 01/14/24 0450 01/15/24 0448 01/17/24 0526 01/18/24 0513 01/19/24 0414 01/20/24 0429  PROCALCITON <0.10  --   --   --   --   --   WBC 8.0   < > 12.6* 14.5* 12.0* 13.4*   < > = values in this interval not displayed.    Critical care time: n/a    Joesph Mussel, DO 01/20/24 9:22 AM [facility]  For contact information, see Amion. If no response to pager, please call PCCM consult pager. After hours, 7PM- 7AM, please call Elink.

## 2024-01-20 NOTE — Progress Notes (Signed)
 PROGRESS NOTE    Tracy Park  WUJ:[id number] DOB: [birth year] DOA: 01/13/2024 PCP: Chana Comas, FNP    Brief Narrative:  25 with history of COPD/emphysema, chronic hypoxia 2 L nasal cannula, paroxysmal A-fib on Eliquis , lung cancer status post left lobectomy, HTN, HLD, hypothyroidism, depression recently discharged on 4/30 after being treated for COPD exacerbation now presents back to the hospital with shortness of breath and wheezing.  Upon admission CT angio was negative for PE but shows chronic lung changes.  Chest x-ray shows emphysema, started on steroids and bronchodilators.  Per pulmonary transition to p.o. prednisone  tomorrow   Assessment & Plan:  Principal Problem:   COPD with acute exacerbation and acute on chronic respiratory failure with hypoxia   Acute respiratory distress Acute COPD exacerbation Chronic hypoxia 2 L nasal cannula - Steroids, bronchodilators, I-S/flutter valve.  Wheezing present but improved. Sloe to improve, will discuss with Pulm to see if we need to alter any management.  Hypertonic saline.  Per pulmonary transition to p.o. prednisone  tomorrow - BNP and procalcitonin are normal, respiratory panel negative - Currently on Levaquin , prior history of Pseudomonas  Paroxysmal atrial fibrillation - Continue Toprol  and Eliquis .  IV as needed  Essential hypertension - Metoprolol  and losartan .  IV as needed  Hypothyroidism - Synthroid   Chronic tobacco use - Counseled to quit using this.  Nicotine  as needed  Hypokalemia - As needed repletion   DVT prophylaxis: SCDs Start: 01/13/24 2248 apixaban  (ELIQUIS ) tablet 5 mg      Code Status: Limited: Do not attempt resuscitation (DNR) -DNR-LIMITED -Do Not Intubate/DNI  Family Communication:   Status is: Inpatient Discharge when cleared by pulmonary.  Pulmonary planning on transitioning to p.o. tomorrow   Subjective: Doing better no complaints  Examination:  General exam: Appears calm and comfortable   Respiratory system: Bilateral diffuse expiratory wheezing Cardiovascular system: S1 & S2 heard, RRR. No JVD, murmurs, rubs, gallops or clicks. No pedal edema. Gastrointestinal system: Abdomen is nondistended, soft and nontender. No organomegaly or masses felt. Normal bowel sounds heard. Central nervous system: Alert and oriented. No focal neurological deficits. Extremities: Symmetric 5 x 5 power. Skin: No rashes, lesions or ulcers Psychiatry: Judgement and insight appear normal. Mood & affect appropriate.                Diet Orders (From admission, onward)     Start     Ordered   01/20/24 1312  Diet regular Room service appropriate? Yes; Fluid consistency: Thin  Diet effective now       Question Answer Comment  Room service appropriate? Yes   Fluid consistency: Thin      01/20/24 1311            Objective: Vitals:   01/19/24 2007 01/19/24 2023 01/20/24 0427 01/20/24 0923  BP:  115/81 129/81   Pulse:  72 71   Resp:  17 18   Temp:  97.6 F (36.4 C) 98.3 F (36.8 C)   TempSrc:  Oral Oral   SpO2: 100% 98% 100% 97%  Weight:      Height:       No intake or output data in the 24 hours ending 01/20/24 1353 Filed Weights   01/13/24 1726  Weight: 46.5 kg    Scheduled Meds:  apixaban   5 mg Oral BID   arformoterol   15 mcg Nebulization BID   budesonide  (PULMICORT ) nebulizer solution  0.5 mg Nebulization BID   dextromethorphan -guaiFENesin   1 tablet Oral BID   feeding supplement  237 mL Oral TID BM   gatifloxacin   1 drop Left Eye QID   [START ON 01/21/2024] levofloxacin   750 mg Oral Q48H   levothyroxine   125 mcg Oral Q0600   magnesium  oxide  400 mg Oral Daily   metoprolol  succinate  12.5 mg Oral Daily   polyvinyl alcohol   1 drop Both Eyes QID   [START ON 01/21/2024] predniSONE   40 mg Oral Q breakfast   revefenacin   175 mcg Nebulization Daily   rosuvastatin   20 mg Oral Daily   sertraline   25 mg Oral Daily   sodium chloride  HYPERTONIC  4 mL Nebulization BID    Continuous Infusions:  Nutritional status Signs/Symptoms: mild fat depletion, severe muscle depletion Interventions: Refer to RD note for recommendations, Ensure Enlive (each supplement provides 350kcal and 20 grams of protein), MVI Body mass index is 18.17 kg/m.  Data Reviewed:   CBC: Recent Labs  Lab 01/13/24 1755 01/14/24 0450 01/16/24 0414 01/17/24 0526 01/18/24 0513 01/19/24 0414 01/20/24 0429  WBC 13.6*   < > 14.5* 12.6* 14.5* 12.0* 13.4*  NEUTROABS 11.1*  --   --   --   --   --   --   HGB 12.5   < > 11.3* 11.2* 11.3* 11.4* 12.3  HCT 42.1   < > 36.0 36.1 37.3 36.9 39.3  MCV 97.5   < > 94.2 94.3 96.1 94.1 93.6  PLT 257   < > 226 221 195 208 210   < > = values in this interval not displayed.   Basic Metabolic Panel: Recent Labs  Lab 01/16/24 0414 01/17/24 0526 01/18/24 0513 01/19/24 0414 01/20/24 0429  NA 136 137 139 138 138  K 4.0 4.0 4.3 4.3 4.2  CL 97* 97* 98 97* 99  CO2 32 32 34* 32 32  GLUCOSE 111* 125* 105* 141* 98  BUN 35* 26* 30* 36* 32*  CREATININE 0.60 0.60 0.70 0.86 0.69  CALCIUM  8.6* 8.7* 8.7* 9.0 9.0  MG 1.9 1.9 2.1 2.1 2.2   GFR: Estimated Creatinine Clearance: 51.5 mL/min (by C-G formula based on SCr of 0.69 mg/dL). Liver Function Tests: No results for input(s): "AST", "ALT", "ALKPHOS", "BILITOT", "PROT", "ALBUMIN" in the last 168 hours. No results for input(s): "LIPASE", "AMYLASE" in the last 168 hours. No results for input(s): "AMMONIA" in the last 168 hours. Coagulation Profile: No results for input(s): "INR", "PROTIME" in the last 168 hours. Cardiac Enzymes: No results for input(s): "CKTOTAL", "CKMB", "CKMBINDEX", "TROPONINI" in the last 168 hours. BNP (last 3 results) No results for input(s): "PROBNP" in the last 8760 hours. HbA1C: No results for input(s): "HGBA1C" in the last 72 hours. CBG: No results for input(s): "GLUCAP" in the last 168 hours. Lipid Profile: No results for input(s): "CHOL", "HDL", "LDLCALC", "TRIG",  "CHOLHDL", "LDLDIRECT" in the last 72 hours. Thyroid  Function Tests: No results for input(s): "TSH", "T4TOTAL", "FREET4", "T3FREE", "THYROIDAB" in the last 72 hours. Anemia Panel: No results for input(s): "VITAMINB12", "FOLATE", "FERRITIN", "TIBC", "IRON ", "RETICCTPCT" in the last 72 hours. Sepsis Labs: Recent Labs  Lab 01/14/24 0450  PROCALCITON <0.10    Recent Results (from the past 240 hours)  Blood culture (routine x 2)     Status: None   Collection Time: 01/10/24  6:15 PM   Specimen: BLOOD RIGHT WRIST  Result Value Ref Range Status   Specimen Description   Final    BLOOD RIGHT WRIST Performed at [facility], [address] [place], [place] [address]    Special  Requests   Final    BOTTLES DRAWN AEROBIC ONLY Blood Culture results may not be optimal due to an inadequate volume of blood received in culture bottles Performed at [facility], [address], [place], [place] [address]    Culture   Final    NO GROWTH 5 DAYS Performed at [facility], [address], [place], [place] [address]    Report Status 01/15/2024 FINAL  Final  Blood culture (routine x 2)     Status: None   Collection Time: 01/10/24  6:15 PM   Specimen: BLOOD LEFT HAND  Result Value Ref Range Status   Specimen Description   Final    BLOOD LEFT HAND Performed at [facility], [address], [place], [place] [address]    Special Requests   Final    BOTTLES DRAWN AEROBIC ONLY Blood Culture results may not be optimal due to an inadequate volume of blood received in culture bottles Performed at [facility], [address] [place], [place] [address]    Culture   Final    NO GROWTH 5 DAYS Performed at [facility], [name], [place], [place] [address]    Report Status 01/15/2024 FINAL  Final  Resp panel by RT-PCR (RSV, Flu A&B, Covid) Anterior Nasal Swab     Status: None   Collection Time: 01/10/24  7:29 PM   Specimen: Anterior  Nasal Swab  Result Value Ref Range Status   SARS Coronavirus 2 by RT PCR NEGATIVE NEGATIVE Final    Comment: (NOTE) SARS-CoV-2 target nucleic acids are NOT DETECTED.  The SARS-CoV-2 RNA is generally detectable in upper respiratory specimens during the acute phase of infection. The lowest concentration of SARS-CoV-2 viral copies this assay can detect is 138 copies/mL. A negative result does not preclude SARS-Cov-2 infection and should not be used as the sole basis for treatment or other patient management decisions. A negative result may occur with  improper specimen collection/handling, submission of specimen other than nasopharyngeal swab, presence of viral mutation(s) within the areas targeted by this assay, and inadequate number of viral copies(<138 copies/mL). A negative result must be combined with clinical observations, patient history, and epidemiological information. The expected result is Negative.  Fact Sheet for Patients:  BloggerCourse.com  Fact Sheet for Healthcare Providers:  SeriousBroker.it  This test is no t yet approved or cleared by the [place]  FDA and  has been authorized for detection and/or diagnosis of SARS-CoV-2 by FDA under an Emergency Use Authorization (EUA). This EUA will remain  in effect (meaning this test can be used) for the duration of the COVID-19 declaration under Section 564(b)(1) of the Act, 21 U.S.C.section 360bbb-3(b)(1), unless the authorization is terminated  or revoked sooner.       Influenza A by PCR NEGATIVE NEGATIVE Final   Influenza B by PCR NEGATIVE NEGATIVE Final    Comment: (NOTE) The Xpert Xpress SARS-CoV-2/FLU/RSV plus assay is intended as an aid in the diagnosis of influenza from Nasopharyngeal swab specimens and should not be used as a sole basis for treatment. Nasal washings and aspirates are unacceptable for Xpert Xpress SARS-CoV-2/FLU/RSV testing.  Fact Sheet for  Patients: BloggerCourse.com  Fact Sheet for Healthcare Providers: SeriousBroker.it  This test is not yet approved or cleared by the [place]  FDA and has been authorized for detection and/or diagnosis of SARS-CoV-2 by FDA under an Emergency Use Authorization (EUA). This EUA will remain in effect (meaning this test  can be used) for the duration of the COVID-19 declaration under Section 564(b)(1) of the Act, 21 U.S.C. section 360bbb-3(b)(1), unless the authorization is terminated or revoked.     Resp Syncytial Virus by PCR NEGATIVE NEGATIVE Final    Comment: (NOTE) Fact Sheet for Patients: BloggerCourse.com  Fact Sheet for Healthcare Providers: SeriousBroker.it  This test is not yet approved or cleared by the [place]  FDA and has been authorized for detection and/or diagnosis of SARS-CoV-2 by FDA under an Emergency Use Authorization (EUA). This EUA will remain in effect (meaning this test can be used) for the duration of the COVID-19 declaration under Section 564(b)(1) of the Act, 21 U.S.C. section 360bbb-3(b)(1), unless the authorization is terminated or revoked.  Performed at [facility], [address] [place], [place] [address]   Resp panel by RT-PCR (RSV, Flu A&B, Covid) Anterior Nasal Swab     Status: None   Collection Time: 01/13/24  5:55 PM   Specimen: Anterior Nasal Swab  Result Value Ref Range Status   SARS Coronavirus 2 by RT PCR NEGATIVE NEGATIVE Final    Comment: (NOTE) SARS-CoV-2 target nucleic acids are NOT DETECTED.  The SARS-CoV-2 RNA is generally detectable in upper respiratory specimens during the acute phase of infection. The lowest concentration of SARS-CoV-2 viral copies this assay can detect is 138 copies/mL. A negative result does not preclude SARS-Cov-2 infection and should not be used as the sole basis for treatment  or other patient management decisions. A negative result may occur with  improper specimen collection/handling, submission of specimen other than nasopharyngeal swab, presence of viral mutation(s) within the areas targeted by this assay, and inadequate number of viral copies(<138 copies/mL). A negative result must be combined with clinical observations, patient history, and epidemiological information. The expected result is Negative.  Fact Sheet for Patients:  BloggerCourse.com  Fact Sheet for Healthcare Providers:  SeriousBroker.it  This test is no t yet approved or cleared by the [place]  FDA and  has been authorized for detection and/or diagnosis of SARS-CoV-2 by FDA under an Emergency Use Authorization (EUA). This EUA will remain  in effect (meaning this test can be used) for the duration of the COVID-19 declaration under Section 564(b)(1) of the Act, 21 U.S.C.section 360bbb-3(b)(1), unless the authorization is terminated  or revoked sooner.       Influenza A by PCR NEGATIVE NEGATIVE Final   Influenza B by PCR NEGATIVE NEGATIVE Final    Comment: (NOTE) The Xpert Xpress SARS-CoV-2/FLU/RSV plus assay is intended as an aid in the diagnosis of influenza from Nasopharyngeal swab specimens and should not be used as a sole basis for treatment. Nasal washings and aspirates are unacceptable for Xpert Xpress SARS-CoV-2/FLU/RSV testing.  Fact Sheet for Patients: BloggerCourse.com  Fact Sheet for Healthcare Providers: SeriousBroker.it  This test is not yet approved or cleared by the [place]  FDA and has been authorized for detection and/or diagnosis of SARS-CoV-2 by FDA under an Emergency Use Authorization (EUA). This EUA will remain in effect (meaning this test can be used) for the duration of the COVID-19 declaration under Section 564(b)(1) of the Act, 21 U.S.C. section  360bbb-3(b)(1), unless the authorization is terminated or revoked.     Resp Syncytial Virus by PCR NEGATIVE NEGATIVE Final    Comment: (NOTE) Fact Sheet for Patients: BloggerCourse.com  Fact Sheet for Healthcare Providers: SeriousBroker.it  This test is not yet approved or cleared by the [place]  FDA and has been authorized for detection and/or diagnosis of  SARS-CoV-2 by FDA under an Emergency Use Authorization (EUA). This EUA will remain in effect (meaning this test can be used) for the duration of the COVID-19 declaration under Section 564(b)(1) of the Act, 21 U.S.C. section 360bbb-3(b)(1), unless the authorization is terminated or revoked.  Performed at [facility], [address] [place], [place] [address]   Respiratory (~20 pathogens) panel by PCR     Status: None   Collection Time: 01/18/24 12:57 PM   Specimen: Nasopharyngeal Swab; Respiratory  Result Value Ref Range Status   Adenovirus NOT DETECTED NOT DETECTED Final   Coronavirus 229E NOT DETECTED NOT DETECTED Final    Comment: (NOTE) The Coronavirus on the Respiratory Panel, DOES NOT test for the novel  Coronavirus (2019 nCoV)    Coronavirus HKU1 NOT DETECTED NOT DETECTED Final   Coronavirus NL63 NOT DETECTED NOT DETECTED Final   Coronavirus OC43 NOT DETECTED NOT DETECTED Final   Metapneumovirus NOT DETECTED NOT DETECTED Final   Rhinovirus / Enterovirus NOT DETECTED NOT DETECTED Final   Influenza A NOT DETECTED NOT DETECTED Final   Influenza B NOT DETECTED NOT DETECTED Final   Parainfluenza Virus 1 NOT DETECTED NOT DETECTED Final   Parainfluenza Virus 2 NOT DETECTED NOT DETECTED Final   Parainfluenza Virus 3 NOT DETECTED NOT DETECTED Final   Parainfluenza Virus 4 NOT DETECTED NOT DETECTED Final   Respiratory Syncytial Virus NOT DETECTED NOT DETECTED Final   Bordetella pertussis NOT DETECTED NOT DETECTED Final   Bordetella Parapertussis  NOT DETECTED NOT DETECTED Final   Chlamydophila pneumoniae NOT DETECTED NOT DETECTED Final   Mycoplasma pneumoniae NOT DETECTED NOT DETECTED Final    Comment: Performed at [facility], [address] [place], [place] [address]  Expectorated Sputum Assessment w Gram Stain, Rflx to Resp Cult     Status: None   Collection Time: 01/18/24  2:40 PM   Specimen: Expectorated Sputum  Result Value Ref Range Status   Specimen Description EXPECTORATED SPUTUM  Final   Special Requests NONE  Final   Sputum evaluation   Final    Sputum specimen not acceptable for testing.  Please recollect.   CRITICAL RESULT CALLED TO, READ BACK BY AND VERIFIED WITH: Joie Narrow RN @ [phone] 01/18/24. GILBERTL Performed at [facility], [address], [place], [place] [address]    Report Status 01/18/2024 FINAL  Final  Expectorated Sputum Assessment w Gram Stain, Rflx to Resp Cult     Status: None   Collection Time: 01/19/24  8:50 AM  Result Value Ref Range Status   Specimen Description EXPECTORATED SPUTUM  Final   Special Requests NONE  Final   Sputum evaluation   Final    THIS SPECIMEN IS ACCEPTABLE FOR SPUTUM CULTURE Performed at [facility], [address] [place], [place] [address]    Report Status 01/19/2024 FINAL  Final  Culture, Respiratory w Gram Stain     Status: None (Preliminary result)   Collection Time: 01/19/24  8:50 AM  Result Value Ref Range Status   Specimen Description   Final    EXPECTORATED SPUTUM Performed at [facility], [address], [place], [place] [address]    Special Requests   Final    NONE Reflexed from [id number] Performed at [facility], [address], [place], [place] [address]    Gram Stain   Final    NO WBC SEEN RARE GRAM POSITIVE COCCI IN PAIRS RARE GRAM POSITIVE RODS RARE  GRAM NEGATIVE RODS Performed at [facility], [address], [place], [place] [address]    Culture  PENDING  Incomplete   Report Status PENDING  Incomplete         Radiology Studies: No results found.         LOS: 7 days   Time spent= 35 mins    Maggie Schooner, MD [facility]  If 7PM-7AM, please contact night-coverage  01/20/2024, 1:53 PM

## 2024-01-20 NOTE — Progress Notes (Signed)
 Nutrition Follow-up  DOCUMENTATION CODES:   Non-severe (moderate) malnutrition in context of chronic illness  INTERVENTION:  - Regular diet. - Ensure Plus High Protein po TID, each supplement provides 350 kcal and 20 grams of protein. - Encourage intake at all meals and of supplements. - Monitor weight trends.  NUTRITION DIAGNOSIS:   Moderate Malnutrition related to chronic illness (COPD) as evidenced by mild fat depletion, severe muscle depletion. *ongoing  GOAL:   Patient will meet greater than or equal to 90% of their needs *progressing  MONITOR:   PO intake, Supplement acceptance, Weight trends  REASON FOR ASSESSMENT:   Consult Assessment of nutrition requirement/status  ASSESSMENT:   68 y.o. female with PMH significant of COPD/emphysema, chronic respiratory failure on 2 L of oxygen , lung cancer s/p left lobectomy, HTN, HLD, hypothyroidism, depression who was recently discharged form the hospital for acute COPD exacerbation but was noncompliant with medication and presented back to the hospital and admitted for acute COPD exacerbation.  Patient consuming 100% of meals. Also drinking Ensure, usually 3 times daily. No new weight since admission to assess recent changes. Likely discharge tomorrow.   Medications reviewed and include: -  Labs reviewed:  -   Diet Order:   Diet Order             Diet regular Room service appropriate? Yes; Fluid consistency: Thin  Diet effective now                   EDUCATION NEEDS:  Education needs have been addressed  Skin:  Skin Assessment: Reviewed RN Assessment  Last BM:  5/12  Height:  Ht Readings from Last 1 Encounters:  01/13/24 5\' 3"  (1.6 m)   Weight:  Wt Readings from Last 1 Encounters:  01/13/24 46.5 kg   BMI:  Body mass index is 18.17 kg/m.  Estimated Nutritional Needs:  Kcal:  1500-1650 kcals Protein:  70-85 grams Fluid:  >/= 1.5L    Scheryl Cushing RD, LDN Contact via Secure Chat.

## 2024-01-21 DIAGNOSIS — J441 Chronic obstructive pulmonary disease with (acute) exacerbation: Secondary | ICD-10-CM | POA: Diagnosis not present

## 2024-01-21 LAB — CBC
HCT: 38.6 % (ref 36.0–46.0)
Hemoglobin: 11.9 g/dL — ABNORMAL LOW (ref 12.0–15.0)
MCH: 29 pg (ref 26.0–34.0)
MCHC: 30.8 g/dL (ref 30.0–36.0)
MCV: 93.9 fL (ref 80.0–100.0)
Platelets: 209 10*3/uL (ref 150–400)
RBC: 4.11 MIL/uL (ref 3.87–5.11)
RDW: 14 % (ref 11.5–15.5)
WBC: 12.1 10*3/uL — ABNORMAL HIGH (ref 4.0–10.5)
nRBC: 0 % (ref 0.0–0.2)

## 2024-01-21 LAB — BASIC METABOLIC PANEL WITH GFR
Anion gap: 9 (ref 5–15)
BUN: 30 mg/dL — ABNORMAL HIGH (ref 8–23)
CO2: 29 mmol/L (ref 22–32)
Calcium: 8.3 mg/dL — ABNORMAL LOW (ref 8.9–10.3)
Chloride: 100 mmol/L (ref 98–111)
Creatinine, Ser: 0.48 mg/dL (ref 0.44–1.00)
GFR, Estimated: >60 mL/min (ref >60)
Glucose, Bld: 88 mg/dL (ref 70–99)
Potassium: 3.7 mmol/L (ref 3.5–5.1)
Sodium: 138 mmol/L (ref 135–145)

## 2024-01-21 LAB — MAGNESIUM: Magnesium: 2.2 mg/dL (ref 1.7–2.4)

## 2024-01-21 MED ORDER — SODIUM CHLORIDE 3 % IN NEBU
4.0000 mL | INHALATION_SOLUTION | RESPIRATORY_TRACT | Status: DC
Start: 2024-01-21 — End: 2024-01-24
  Administered 2024-01-21 – 2024-01-22 (×6): 4 mL via RESPIRATORY_TRACT
  Filled 2024-01-21 (×12): qty 4

## 2024-01-21 NOTE — Plan of Care (Signed)

## 2024-01-21 NOTE — TOC Progression Note (Addendum)
 Transition of Care [facility]) - Progression Note    Patient Details  Name: Tracy Park MRN: [id number] Date of Birth: 1956/01/27  Transition of Care [facility] CM/SW Contact  Kathryn Parish, RN Phone Number: 01/21/2024, 12:53 PM  Clinical Narrative:    NCM spoke with patient in room. No changes to patients oxygen  requirement notes. TOC will continue to follow for discharge.   Expected Discharge Plan: Home/Self Care Barriers to Discharge: Continued Medical Work up  Expected Discharge Plan and Services In-house Referral: NA Discharge Planning Services: CM Consult Post Acute Care Choice: NA                   DME Arranged: N/A DME Agency: NA       HH Arranged: NA HH Agency: NA         Social Determinants of Health (SDOH) Interventions SDOH Screenings   Food Insecurity: No Food Insecurity (01/14/2024)  Housing: Low Risk  (01/14/2024)  Transportation Needs: No Transportation Needs (01/14/2024)  Utilities: Not At Risk (01/14/2024)  Financial Resource Strain: High Risk (10/08/2023)   Received from [facility]  Physical Activity: Unknown (04/07/2023)   Received from [facility]  Social Connections: Moderately Integrated (01/14/2024)  Recent Concern: Social Connections - Socially Isolated (12/30/2023)  Stress: Stress Concern Present (04/07/2023)   Received from [facility]  Tobacco Use: High Risk (01/13/2024)    Readmission Risk Interventions    01/14/2024    2:11 PM 01/05/2024   12:08 PM 12/30/2023   11:48 AM  Readmission Risk Prevention Plan  Transportation Screening Complete Complete Complete  Medication Review Oceanographer) Complete Complete Complete  PCP or Specialist appointment within 3-5 days of discharge Complete Complete Complete  HRI or Home Care Consult -- Complete Complete  SW Recovery Care/Counseling Consult -- Complete Complete  Palliative Care Screening Not Applicable Not Applicable Not Applicable  Skilled Nursing Facility Not Applicable Not Applicable Not  Applicable

## 2024-01-21 NOTE — Care Management Important Message (Signed)
 Important Message  Patient Details  Name: Tracy Park MRN: [id number] Date of Birth: 1956-07-05   Important Message Given:        [name] 01/21/2024, 4:25 PM

## 2024-01-21 NOTE — Progress Notes (Signed)
 NAME:  Tracy Park, MRN:  [id number], DOB:  07/19/1956, LOS: 8 ADMISSION DATE:  01/13/2024, CONSULTATION DATE:  01/18/24 REFERRING MD:  Rand Burrs, MD, CHIEF COMPLAINT:  COPD exacerbation   History of Present Illness:  Tracy Park is a 68 year old woman, daily smoker with COPD, OSA, paroxysmal atrial fibrillation and stage IIIA LUL lung adenocarcinoma s/p left upper lobectomy 2018 who is admitted for COPD exacerbation.  PCCM consulted for further evaluation. She is followed by the [facility]monary team. Last seen 11/15/23. Her outpatient regimen includes breztri  2 puffs twice daily and as needed albuterol . Ohtuvayre  nebulizer treatments were considered at last visit.   She has been started on doxycycline  since 5/6, budesonide  0.5mg  nebs twice daily, duonebs q8hrs and solumedrol 40mg  twice daily IV. She continues to have wheezing on exam which prompted consult.   CTA Chest 5/3 is negative for PE. Diffuse emphysematous changes. RUL consolidation is stable compared to 12/13/2023.   Micro review shows history of pseudomonas in her sputum in 06/2023.  Pertinent  Medical History   Past Medical History:  Diagnosis Date   Anginal pain (HCC)    Anxiety    Bipolar disorder (HCC)    Cancer (HCC)    COPD (chronic obstructive pulmonary disease) (HCC)    Dyspnea    Family history of adverse reaction to anesthesia    History of kidney stones    Hydroureteronephrosis 08/16/2021   Hypothyroidism    Lung cancer (HCC)    Myocardial infarction (HCC)    Paroxysmal atrial fibrillation (HCC)    PTSD (post-traumatic stress disorder)    Sleep apnea    Thyroid  disease      Significant Hospital Events: Including procedures, antibiotic start and stop dates in addition to other pertinent events   5/6 admitted for COPD exacerbation 5/11 PCCM consulted  Interim History / Subjective:  Still feels about the same.  Has not been able to get up and walk around in the hallway with assistance.  Not clearing  many secretions despite chest PT.  Objective    Blood pressure 112/69, pulse 62, temperature 98.7 F (37.1 C), resp. rate 18, height 5\' 3"  (1.6 m), weight 46.5 kg, SpO2 100%.       No intake or output data in the 24 hours ending 01/21/24 0719 Filed Weights   01/13/24 1726  Weight: 46.5 kg    Examination: General: Chronically ill-appearing woman sitting up in bed no acute distress HENT: [place]/AT, eyes anicteric Lungs: Breathing comfortably on 2 L nasal cannula, still has bilateral expiratory wheezing-similar to previous exams.  No conversational dyspnea. Cardiovascular: 1 S2, regular rate and rhythm  Abdomen: Soft, nontender, nondistended Extremities: No clubbing or cyanosis Neuro: Awake and alert, answering questions appropriately and moving all extremities.    Sputum culture: rare GPC, GPR, GNR> pseudomonas>   WBC 12.1  CT chest personally reviewed> no PE, no significant adenopathy. Posterior RUL nodule in an area with emphysema. LUL fibrotic changes.   Assessment & Plan:  Chronic hypoxemic & hypercapnic respiratory failure, 2L baseline O2 COPD with acute exacerbation Cigarette smoker; has been cutting back on her own  Hx of lung cancer, s/p lobectomy RUL small nodule on CT- appears similar to fibrotic changes in left upper lobe   Plan: - Continue high-dose levofloxacin , needs 2 weeks to maximize chances of clearing this and will need follow-up sputum culture to test for resolution.  Based on her symptoms it seems that she clinically resolved her previous infection (no baseline sputum  production)   -Reorder flutter valve, hypertonic saline nebs every 4 hours while awake to improve pulmonary clearance -Continue bronchodilators-Brovana , Yupelri , Pulmicort .  Long-term we need to consider if ICS is still in her best interest due to risk of colonization. -Prednisone  daily -Will need outpatient follow-up with her [facility] pulmonary team.  If her right upper lobe opacity does not  resolve she will need further evaluation. -Recommend full smoking cessation; she has already been working on this and seems motivated.   PCCM will follow up on 5/16.  Please call in the interim with questions.  Best Practice (right click and "Reselect all SmartList Selections" daily)   Per primary  Labs   CBC: Recent Labs  Lab 01/17/24 0526 01/18/24 0513 01/19/24 0414 01/20/24 0429 01/21/24 0435  WBC 12.6* 14.5* 12.0* 13.4* 12.1*  HGB 11.2* 11.3* 11.4* 12.3 11.9*  HCT 36.1 37.3 36.9 39.3 38.6  MCV 94.3 96.1 94.1 93.6 93.9  PLT 221 195 208 210 209    Basic Metabolic Panel: Recent Labs  Lab 01/17/24 0526 01/18/24 0513 01/19/24 0414 01/20/24 0429 01/21/24 0435  NA 137 139 138 138 138  K 4.0 4.3 4.3 4.2 3.7  CL 97* 98 97* 99 100  CO2 32 34* 32 32 29  GLUCOSE 125* 105* 141* 98 88  BUN 26* 30* 36* 32* 30*  CREATININE 0.60 0.70 0.86 0.69 0.48  CALCIUM  8.7* 8.7* 9.0 9.0 8.3*  MG 1.9 2.1 2.1 2.2 2.2   GFR: Estimated Creatinine Clearance: 51.5 mL/min (by C-G formula based on SCr of 0.48 mg/dL). Recent Labs  Lab 01/18/24 0513 01/19/24 0414 01/20/24 0429 01/21/24 0435  WBC 14.5* 12.0* 13.4* 12.1*    Critical care time: n/a    Joesph Mussel, DO 01/21/24 9:41 AM [facility]  For contact information, see Amion. If no response to pager, please call PCCM consult pager. After hours, 7PM- 7AM, please call Elink.

## 2024-01-21 NOTE — Progress Notes (Signed)
 Mobility Specialist - Progress Note   01/21/24 1000  Mobility  Activity Ambulated with assistance in hallway  Level of Assistance Independent after set-up  Assistive Device None  Distance Ambulated (ft) 350 ft  Range of Motion/Exercises Active  Activity Response Tolerated well  Mobility Referral Yes  Mobility visit 1 Mobility  Mobility Specialist Start Time (ACUTE ONLY) 0941  Mobility Specialist Stop Time (ACUTE ONLY) 0949  Mobility Specialist Time Calculation (min) (ACUTE ONLY) 8 min   Received in bed and agreed to mobility. On 2L, pt saturations did not go lower than 92%. Returned to bed with all needs met.  Ivonne Marry Mobility Specialist

## 2024-01-21 NOTE — Progress Notes (Signed)
 PROGRESS NOTE    Tracy Park  WCH:[id number]  DOB: 10-17-1955  DOA: 01/13/2024 PCP: Chana Comas, FNP Outpatient Specialists:   Hospital course:  68 year old with chronic hypoxic and hypercapnic respiratory failure secondary to COPD/emphysema, PAF on Eliquis , lung cancer s/p left lobectomy, HTN is early readmission after treatment for COPD exacerbation 2 weeks ago.  Patient continues to smoke.  She is being treated with steroids and inhaled bronchodilators, being followed by PCCM.  Subjective:  States she is on 2 L oxygen  at home.  She notes that she still has some faint wheezing although that is not abnormal for her.  Since her lobectomy.   Objective: Vitals:   01/20/24 2226 01/21/24 0522 01/21/24 0525 01/21/24 0923  BP:  103/61 112/69   Pulse:  64 62   Resp:  18 18   Temp:  97.8 F (36.6 C) 98.7 F (37.1 C)   TempSrc:  Oral Oral   SpO2: 99% 100% 100% 98%  Weight:      Height:       No intake or output data in the 24 hours ending 01/21/24 1616 Filed Weights   01/13/24 1726  Weight: 46.5 kg     Exam:  General: In female sitting up in bed with normal respiratory rate, no increased WOB Eyes: sclera anicteric, conjuctiva mild injection bilaterally CVS: S1-S2, regular  Respiratory:  decreased air entry bilaterally with faint expiratory wheeze in both lung fields GI: NABS, soft, NT  LE: Warm and well-perfused Neuro: A/O x 3,  grossly nonfocal.  Psych: patient is logical and coherent, judgement and insight appear normal, mood and affect appropriate to situation.  Data Reviewed:  Basic Metabolic Panel: Recent Labs  Lab 01/17/24 0526 01/18/24 0513 01/19/24 0414 01/20/24 0429 01/21/24 0435  NA 137 139 138 138 138  K 4.0 4.3 4.3 4.2 3.7  CL 97* 98 97* 99 100  CO2 32 34* 32 32 29  GLUCOSE 125* 105* 141* 98 88  BUN 26* 30* 36* 32* 30*  CREATININE 0.60 0.70 0.86 0.69 0.48  CALCIUM  8.7* 8.7* 9.0 9.0 8.3*  MG 1.9 2.1 2.1 2.2 2.2    CBC: Recent Labs   Lab 01/17/24 0526 01/18/24 0513 01/19/24 0414 01/20/24 0429 01/21/24 0435  WBC 12.6* 14.5* 12.0* 13.4* 12.1*  HGB 11.2* 11.3* 11.4* 12.3 11.9*  HCT 36.1 37.3 36.9 39.3 38.6  MCV 94.3 96.1 94.1 93.6 93.9  PLT 221 195 208 210 209     Scheduled Meds:  apixaban   5 mg Oral BID   arformoterol   15 mcg Nebulization BID   budesonide  (PULMICORT ) nebulizer solution  0.5 mg Nebulization BID   dextromethorphan -guaiFENesin   1 tablet Oral BID   feeding supplement  237 mL Oral TID BM   gatifloxacin   1 drop Left Eye QID   levofloxacin   750 mg Oral Q48H   levothyroxine   125 mcg Oral Q0600   magnesium  oxide  400 mg Oral Daily   metoprolol  succinate  12.5 mg Oral Daily   polyvinyl alcohol   1 drop Both Eyes QID   predniSONE   40 mg Oral Q breakfast   revefenacin   175 mcg Nebulization Daily   rosuvastatin   20 mg Oral Daily   sertraline   25 mg Oral Daily   sodium chloride  HYPERTONIC  4 mL Nebulization Q4H while awake   Continuous Infusions:   Assessment & Plan:   Acute on chronic hypoxic respiratory failure Acute exacerbation of COPD/diffuse in fact that it was changes Chronic/subacute RUL consolidation with H/O  Pseudomonas in sputum Patient is clinically improving on steroids and inhaled bronchodilators Continue Brovana , Ula Pedler rate and Pulmicort  Continue high-dose Levaquin  for at least 2 weeks per pulmonary Hypertonic saline nebulizers help clear sputum  Ongoing tobacco use Strongly urged to stop smoking, patient states she is trying  PAF Rate controlled on metoprolol  Primary stroke prevention on Eliquis   HTN Controlled on present regimen  Hypothyroidism Continue Synthroid     DVT prophylaxis: Eliquis  Code Status: DNR limited Family Communication: None today     Studies: No results found.  Principal Problem:   COPD with acute exacerbation and acute on chronic respiratory failure with hypoxia Active Problems:   Malnutrition of moderate degree     Jaydalynn Olivero  Tublu Kyo Cocuzza, [facility]  If 7PM-7AM, please contact night-coverage [email or web address]   LOS: 8 days

## 2024-01-22 DIAGNOSIS — B965 Pseudomonas (aeruginosa) (mallei) (pseudomallei) as the cause of diseases classified elsewhere: Secondary | ICD-10-CM

## 2024-01-22 DIAGNOSIS — F17210 Nicotine dependence, cigarettes, uncomplicated: Secondary | ICD-10-CM

## 2024-01-22 DIAGNOSIS — J9611 Chronic respiratory failure with hypoxia: Secondary | ICD-10-CM

## 2024-01-22 LAB — CULTURE, RESPIRATORY W GRAM STAIN: Gram Stain: NONE SEEN

## 2024-01-22 MED ORDER — PIPERACILLIN-TAZOBACTAM 3.375 G IVPB
3.3750 g | Freq: Three times a day (TID) | INTRAVENOUS | Status: DC
  Administered 2024-01-22: 3.375 g via INTRAVENOUS
  Filled 2024-01-22: qty 50

## 2024-01-22 NOTE — Plan of Care (Signed)
  Problem: Education: Goal: Knowledge of General Education information will improve Description: Including pain rating scale, medication(s)/side effects and non-pharmacologic comfort measures Outcome: Progressing   Problem: Health Behavior/Discharge Planning: Goal: Ability to manage health-related needs will improve Outcome: Progressing   Problem: Clinical Measurements: Goal: Ability to maintain clinical measurements within normal limits will improve Outcome: Progressing Goal: Will remain free from infection Outcome: Progressing Goal: Diagnostic test results will improve Outcome: Progressing Goal: Respiratory complications will improve Outcome: Progressing Goal: Cardiovascular complication will be avoided Outcome: Progressing   Problem: Activity: Goal: Risk for activity intolerance will decrease Outcome: Progressing   Problem: Nutrition: Goal: Adequate nutrition will be maintained Outcome: Progressing   Problem: Coping: Goal: Level of anxiety will decrease Outcome: Progressing   Problem: Pain Managment: Goal: General experience of comfort will improve and/or be controlled Outcome: Progressing   Problem: Elimination: Goal: Will not experience complications related to bowel motility Outcome: Progressing Goal: Will not experience complications related to urinary retention Outcome: Progressing   Problem: Safety: Goal: Ability to remain free from injury will improve Outcome: Progressing   Problem: Education: Goal: Knowledge of disease or condition will improve Outcome: Progressing Goal: Knowledge of the prescribed therapeutic regimen will improve Outcome: Progressing Goal: Individualized Educational Video(s) Outcome: Progressing   Problem: Respiratory: Goal: Ability to maintain a clear airway will improve Outcome: Progressing Goal: Levels of oxygenation will improve Outcome: Progressing Goal: Ability to maintain adequate ventilation will improve Outcome:  Progressing

## 2024-01-22 NOTE — Progress Notes (Signed)
 PROGRESS NOTE    Tracy Park  Tracy Park  DOB: Tracy Park  DOA: 01/13/2024 PCP: Tracy Comas, FNP Outpatient Specialists:   Hospital course:  68 year old with chronic hypoxic and hypercapnic respiratory failure secondary to COPD/emphysema, PAF on Eliquis , lung cancer s/p left lobectomy, HTN is early readmission after treatment for COPD exacerbation 2 weeks ago.  Patient continues to smoke.  She is being treated with steroids and inhaled bronchodilators, being followed by PCCM.  Subjective:  States she is on 2 L oxygen  at home.  She notes that she still has some faint wheezing although that is not abnormal for her.  Since her lobectomy.   Objective: Vitals:   01/22/24 0433 01/22/24 1034 01/22/24 1318 01/22/24 1602  BP: 115/73  117/74   Pulse: 62  65   Resp: 18     Temp: 97.9 F (36.6 C)  98.1 F (36.7 C)   TempSrc: Oral  Oral   SpO2: 100% 96% 98% 99%  Weight:      Height:        Intake/Output Summary (Last 24 hours) at 01/22/2024 1635 Last data filed at 01/22/2024 1544 Gross per 24 hour  Intake 170 ml  Output --  Net 170 ml   Filed Weights   01/13/24 1726  Weight: 46.5 kg     Exam:  General: Comfortable appearing female sitting up in bed with normal respiratory rate, no increased WOB Eyes: sclera anicteric, conjuctiva mild injection bilaterally CVS: S1-S2, regular  Respiratory:  decreased air entry bilaterally with faint expiratory wheeze in both lung fields GI: NABS, soft, NT  LE: Warm and well-perfused Neuro: A/O x 3,  grossly nonfocal.  Psych: patient is logical and coherent, judgement and insight appear normal, mood and affect appropriate to situation.  Data Reviewed:  Basic Metabolic Panel: Recent Labs  Lab 01/17/24 0526 01/18/24 0513 01/19/24 0414 01/20/24 0429 01/21/24 0435  NA 137 139 138 138 138  K 4.0 4.3 4.3 4.2 3.7  CL 97* 98 97* 99 100  CO2 32 34* 32 32 29  GLUCOSE 125* 105* 141* 98 88  BUN 26* 30* 36* 32* 30*  CREATININE  0.60 0.70 0.86 0.69 0.48  CALCIUM  8.7* 8.7* 9.0 9.0 8.3*  MG 1.9 2.1 2.1 2.2 2.2    CBC: Recent Labs  Lab 01/17/24 0526 01/18/24 0513 01/19/24 0414 01/20/24 0429 01/21/24 0435  WBC 12.6* 14.5* 12.0* 13.4* 12.1*  HGB 11.2* 11.3* 11.4* 12.3 11.9*  HCT 36.1 37.3 36.9 39.3 38.6  MCV 94.3 96.1 94.1 93.6 93.9  PLT 221 195 208 210 209     Scheduled Meds:  apixaban   5 mg Oral BID   arformoterol   15 mcg Nebulization BID   budesonide  (PULMICORT ) nebulizer solution  0.5 mg Nebulization BID   dextromethorphan -guaiFENesin   1 tablet Oral BID   feeding supplement  237 mL Oral TID BM   gatifloxacin   1 drop Left Eye QID   levothyroxine   125 mcg Oral Q0600   magnesium  oxide  400 mg Oral Daily   metoprolol  succinate  12.5 mg Oral Daily   polyvinyl alcohol   1 drop Both Eyes QID   predniSONE   40 mg Oral Q breakfast   revefenacin   175 mcg Nebulization Daily   rosuvastatin   20 mg Oral Daily   sertraline   25 mg Oral Daily   sodium chloride  HYPERTONIC  4 mL Nebulization Q4H while awake   Continuous Infusions:   Assessment & Plan:   Acute on chronic hypoxic respiratory failure Acute exacerbation  of COPD/diffuse in fact that it was changes Patient is clinically improving on steroids and inhaled bronchodilators Continue Brovana , Ula Pedler rate and Pulmicort  Hypertonic saline nebulizers help clear sputum  Chronic/subacute RUL consolidation with H/O Pseudomonas in sputum Patient has been on high-dose Levaquin  for treatment of Pseudomonas However today it appears that Pseudomonas is resistant to Levaquin  ID was consulted, appreciate their recommendations, they note that patient has improved clinically despite treatment that is not effective for treatment of Pseudomonas. Pseudomonas may well be commensal rather than causing an infection. They recommend discontinuing all antibiotics and discharging patient home with close follow-up with her pulmonologist to follow clinically. Discussed with  patient, she is agreeable to this, notes she has a follow-up with her pulmonologist on May 27.  Ongoing tobacco use Strongly urged to stop smoking, patient states she is trying  PAF Rate controlled on metoprolol  Primary stroke prevention on Eliquis   HTN Controlled on present regimen  Hypothyroidism Continue Synthroid     DVT prophylaxis: Eliquis  Code Status: DNR limited Family Communication: None today     Studies: No results found.  Principal Problem:   COPD with acute exacerbation and acute on chronic respiratory failure with hypoxia Active Problems:   Malnutrition of moderate degree     Tracy Park, [facility]  If 7PM-7AM, please contact night-coverage [email or web address]   LOS: 9 days

## 2024-01-22 NOTE — Consult Note (Signed)
 ````        [facility]    Date of Admission:  01/13/2024   Total days of inpatient antibiotics 9        Reason for Consult: PSA+ sputum Cx    Principal Problem:   COPD with acute exacerbation and acute on chronic respiratory failure with hypoxia Active Problems:   Malnutrition of moderate degree   Assessment: 68 year old female with history of Pseudomonas positive sputum cultures, COPD on 2 L nasal cannula, A-fib on Eliquis , lung cancer status post left lobectomy with recent hospitalization for COPD exacerbation nonadherent to prednisone  at home admitted with: #COPD exacerbation #Sputum cultures grew Pseudomonas aeruginosa  c/w colonizatioin - Patient is at baseline with her O2 sat sats.  Her Pseudomonas is resistant to ciprofloxacin.  I suspect Pseudomonas is colonizer no further treatment needed.  Recommendations:  -Stop antibiotics(zosyn  started today at 1040 AM) as patient has been on Doxy then Levaquin  since 5/6, respiratory status baseline despite not being on antibiotics that would cover PsA strain(cipro R). -In regards to CT at prior ED visit on 5/3, noted that there are chronic changes in both lungs. Stable consolidation RUL (prior  ct was on 4/5). I think ok to follow with pulm outpatient, no urgent concerns from ID perspective at this point. - Agree with steroid taper - Communicated with primary. - ID will sign off  Evaluation of this patient requires complex antimicrobial therapy evaluation and counseling + isolation needs for disease transmission risk assessment and mitigation   Microbiology:   Antibiotics: Doxycycline  5/6/5-11 Levaquin  5/11-present Pip-tazo  Cultures: Blood  Urine  Other 5/11 no growth 5/12 rare Pseudomonas aeruginosa  HPI: Dacie L Wimer is a 68 y.o. female with past medical history of tobacco abuse, COPD on 2 L nasal cannula, OSA, PAF on Eliquis , lung cancer as post left lobectomy, hypertension  hyperlipidemia, hypothyroidism, depression, recent hospitalization for COPD exacerbation presented with shortness of breath and wheezing.  Her O2 sat was 82% patient stated she was nonadherent to her prednisone .  On arrival white cell count of 13.6.  Chest x-ray showed emphysema without pneumonia or pulmonary edema.  Patient was treated for COPD exacerbation initially on doxycycline .  She placed on steroids as well.  CCM was consulted as patient continued to have some wheezing and was slow to improve.  They transition patient to Levaquin  and stop Doxy given Pseudomonas history.  Tracheal aspirate grew Pseudomonas aeruginosa.   Review of Systems: Review of Systems  All other systems reviewed and are negative.   Past Medical History:  Diagnosis Date   Anginal pain (HCC)    Anxiety    Bipolar disorder (HCC)    Cancer (HCC)    COPD (chronic obstructive pulmonary disease) (HCC)    Dyspnea    Family history of adverse reaction to anesthesia    History of kidney stones    Hydroureteronephrosis 08/16/2021   Hypothyroidism    Lung cancer (HCC)    Myocardial infarction (HCC)    Paroxysmal atrial fibrillation (HCC)    PTSD (post-traumatic stress disorder)    Sleep apnea    Thyroid  disease     Social History   Tobacco Use   Smoking status: Every Day    Current packs/day: 0.15    Types: Cigarettes   Smokeless tobacco: Never  Vaping Use   Vaping status: Never Used  Substance Use Topics   Alcohol  use: Not Currently   Drug use: Not Currently    Family History  Problem Relation Age of Onset   Hypertension Mother    Heart failure Mother    Hypertension Father    Diabetes Father    Heart failure Father    Scheduled Meds:  apixaban   5 mg Oral BID   arformoterol   15 mcg Nebulization BID   budesonide  (PULMICORT ) nebulizer solution  0.5 mg Nebulization BID   dextromethorphan -guaiFENesin   1 tablet Oral BID   feeding supplement  237 mL Oral TID BM   gatifloxacin   1 drop Left Eye QID    levothyroxine   125 mcg Oral Q0600   magnesium  oxide  400 mg Oral Daily   metoprolol  succinate  12.5 mg Oral Daily   polyvinyl alcohol   1 drop Both Eyes QID   predniSONE   40 mg Oral Q breakfast   revefenacin   175 mcg Nebulization Daily   rosuvastatin   20 mg Oral Daily   sertraline   25 mg Oral Daily   sodium chloride  HYPERTONIC  4 mL Nebulization Q4H while awake   Continuous Infusions:  piperacillin -tazobactam (ZOSYN )  IV 3.375 g (01/22/24 1040)   PRN Meds:.acetaminophen , benzonatate , glucagon  (human recombinant), hydrALAZINE , ipratropium-albuterol , metoprolol  tartrate, nitroGLYCERIN , ondansetron , oxyCODONE , polyvinyl alcohol , senna-docusate Allergies  Allergen Reactions   Red Dye #40 (Allura Red) Hives, Itching and Other (See Comments)    Red food dye   Strawberry Extract Hives and Itching   Tomato Hives and Itching   Aspirin Hives   Tape Rash and Other (See Comments)    Prefers paper tape   Wound Dressing Adhesive Rash    OBJECTIVE: Blood pressure 115/73, pulse 62, temperature 97.9 F (36.6 C), temperature source Oral, resp. rate 18, height 5\' 3"  (1.6 m), weight 46.5 kg, SpO2 96%.  Physical Exam Constitutional:      Appearance: Normal appearance.  HENT:     Head: Normocephalic and atraumatic.     Right Ear: Tympanic membrane normal.     Left Ear: Tympanic membrane normal.     Nose: Nose normal.     Mouth/Throat:     Mouth: Mucous membranes are moist.  Eyes:     Extraocular Movements: Extraocular movements intact.     Conjunctiva/sclera: Conjunctivae normal.     Pupils: Pupils are equal, round, and reactive to light.  Cardiovascular:     Rate and Rhythm: Normal rate and regular rhythm.     Heart sounds: No murmur heard.    No friction rub. No gallop.  Pulmonary:     Effort: Pulmonary effort is normal.     Breath sounds: Normal breath sounds.  Abdominal:     General: Abdomen is flat.     Palpations: Abdomen is soft.  Musculoskeletal:        General: Normal range  of motion.  Skin:    General: Skin is warm and dry.  Neurological:     General: No focal deficit present.     Mental Status: She is alert and oriented to person, place, and time.  Psychiatric:        Mood and Affect: Mood normal.     Lab Results Lab Results  Component Value Date   WBC 12.1 (H) 01/21/2024   HGB 11.9 (L) 01/21/2024   HCT 38.6 01/21/2024   MCV 93.9 01/21/2024   PLT 209 01/21/2024    Lab Results  Component Value Date   CREATININE 0.48 01/21/2024   BUN 30 (H) 01/21/2024   NA 138 01/21/2024   K 3.7 01/21/2024   CL 100 01/21/2024   CO2 29 01/21/2024  Lab Results  Component Value Date   ALT 16 01/10/2024   AST 16 01/10/2024   ALKPHOS 53 01/10/2024   BILITOT 0.4 01/10/2024       Orlie Bjornstad, MD [facility] 01/22/2024, 10:47 AM

## 2024-01-22 NOTE — Progress Notes (Signed)
 NAME:  Tracy Park, MRN:  [id number], DOB:  07/25/1956, LOS: 9 ADMISSION DATE:  01/13/2024, CONSULTATION DATE:  01/18/24 REFERRING MD:  Rand Burrs, MD, CHIEF COMPLAINT:  COPD exacerbation   History of Present Illness:  Tracy Park is a 68 year old woman, daily smoker with COPD, OSA, paroxysmal atrial fibrillation and stage IIIA LUL lung adenocarcinoma s/p left upper lobectomy 2018 who is admitted for COPD exacerbation.  PCCM consulted for further evaluation. She is followed by the [facility]monary team. Last seen 11/15/23. Her outpatient regimen includes breztri  2 puffs twice daily and as needed albuterol . Ohtuvayre  nebulizer treatments were considered at last visit.   She has been started on doxycycline  since 5/6, budesonide  0.5mg  nebs twice daily, duonebs q8hrs and solumedrol 40mg  twice daily IV. She continues to have wheezing on exam which prompted consult.   CTA Chest 5/3 is negative for PE. Diffuse emphysematous changes. RUL consolidation is stable compared to 12/13/2023.   Micro review shows history of pseudomonas in her sputum in 06/2023.  Pertinent  Medical History   Past Medical History:  Diagnosis Date   Anginal pain (HCC)    Anxiety    Bipolar disorder (HCC)    Cancer (HCC)    COPD (chronic obstructive pulmonary disease) (HCC)    Dyspnea    Family history of adverse reaction to anesthesia    History of kidney stones    Hydroureteronephrosis 08/16/2021   Hypothyroidism    Lung cancer (HCC)    Myocardial infarction (HCC)    Paroxysmal atrial fibrillation (HCC)    PTSD (post-traumatic stress disorder)    Sleep apnea    Thyroid  disease      Significant Hospital Events: Including procedures, antibiotic start and stop dates in addition to other pertinent events   5/6 admitted for COPD exacerbation 5/11 PCCM consulted  Interim History / Subjective:  Feels a little better today.   Objective    Blood pressure 115/73, pulse 62, temperature 97.9 F (36.6 C), resp.  rate 18, height 5\' 3"  (1.6 m), weight 46.5 kg, SpO2 100%.        Intake/Output Summary (Last 24 hours) at 01/22/2024 0746 Last data filed at 01/21/2024 2100 Gross per 24 hour  Intake 120 ml  Output --  Net 120 ml   Filed Weights   01/13/24 1726  Weight: 46.5 kg    Examination: General: chronically ill appearing woman sitting up in bed in NAD HENT: [place]/AT, eyes anicteric Lungs: breathing comfortably on 2L [place], less wheezing today Cardiovascular: S1S2, RRR Abdomen: nondistneded Extremities: no cyanosis Neuro: awake & alert, answering questions appropriately.    Sputum culture: rare GPC, GPR, GNR> pseudomonas> resistant to quinolones, otherwise sensitive  CT chest personally reviewed> no PE, no significant adenopathy. Posterior RUL nodule in an area with emphysema. LUL fibrotic changes.   Assessment & Plan:  Chronic hypoxemic & hypercapnic respiratory failure, 2L baseline O2 COPD with acute exacerbation Cigarette smoker; has been cutting back on her own  Hx of lung cancer, s/p lobectomy RUL small nodule on CT- appears similar to fibrotic changes in left upper lobe  Pseudomonas infection;  Plan: - Stop levofloxacin , start zosyn  (ordered). Will need PICC line and 2 weeks of antibiotics. No oral options unfortunately.  Plan for 2 week course. Recommend her pulmonologist check for eradication as OP after completion of antibiotics. -flutter valve; recommend prescribing 3% saline nebs at discharge for TID use (sent to [facility] since most pharmacies do not stock this med).  -con't  PTA Breztri ; defer to her Pulmonologist if dropping ICS is needed -wean prednisone  over the next week (30mg  x 2 days, 20mg  x 2 days, 10mg  x 3 days, then stop) -Will need outpatient follow-up with her [facility] pulmonary team.  I have asked her to call them today to request an appointment. If her right upper lobe opacity does not resolve she will need further evaluation. -Recommend complete tobacco  cessation.     Best Practice (right click and "Reselect all SmartList Selections" daily)   Per primary  Labs   CBC: Recent Labs  Lab 01/17/24 0526 01/18/24 0513 01/19/24 0414 01/20/24 0429 01/21/24 0435  WBC 12.6* 14.5* 12.0* 13.4* 12.1*  HGB 11.2* 11.3* 11.4* 12.3 11.9*  HCT 36.1 37.3 36.9 39.3 38.6  MCV 94.3 96.1 94.1 93.6 93.9  PLT 221 195 208 210 209    Basic Metabolic Panel: Recent Labs  Lab 01/17/24 0526 01/18/24 0513 01/19/24 0414 01/20/24 0429 01/21/24 0435  NA 137 139 138 138 138  K 4.0 4.3 4.3 4.2 3.7  CL 97* 98 97* 99 100  CO2 32 34* 32 32 29  GLUCOSE 125* 105* 141* 98 88  BUN 26* 30* 36* 32* 30*  CREATININE 0.60 0.70 0.86 0.69 0.48  CALCIUM  8.7* 8.7* 9.0 9.0 8.3*  MG 1.9 2.1 2.1 2.2 2.2   GFR: Estimated Creatinine Clearance: 51.5 mL/min (by C-G formula based on SCr of 0.48 mg/dL). Recent Labs  Lab 01/18/24 0513 01/19/24 0414 01/20/24 0429 01/21/24 0435  WBC 14.5* 12.0* 13.4* 12.1*    Critical care time: n/a    Joesph Mussel, DO 01/22/24 9:41 AM [facility]  For contact information, see Amion. If no response to pager, please call PCCM consult pager. After hours, 7PM- 7AM, please call Elink.

## 2024-01-22 NOTE — Progress Notes (Signed)
 Mobility Specialist - Progress Note   01/22/24 1309  Oxygen  Therapy  O2 Device Nasal Cannula  O2 Flow Rate (L/min) 2 L/min  Patient Activity (if Appropriate) Ambulating  Mobility  Activity Ambulated independently in hallway  Level of Assistance Independent  Assistive Device None  Distance Ambulated (ft) 350 ft  Activity Response Tolerated well  Mobility Referral Yes  Mobility visit 1 Mobility  Mobility Specialist Start Time (ACUTE ONLY) 1227  Mobility Specialist Stop Time (ACUTE ONLY) 1238  Mobility Specialist Time Calculation (min) (ACUTE ONLY) 11 min   Pt received in bed and agreeable to mobility. No complaints during session. Pt to bed after session with all needs met.    [facility]

## 2024-01-23 ENCOUNTER — Other Ambulatory Visit: Payer: Self-pay

## 2024-01-23 ENCOUNTER — Other Ambulatory Visit (HOSPITAL_COMMUNITY): Payer: Self-pay

## 2024-01-23 MED ORDER — SODIUM CHLORIDE 3 % IN NEBU
4.0000 mL | INHALATION_SOLUTION | Freq: Three times a day (TID) | RESPIRATORY_TRACT | 0 refills | Status: DC
  Filled 2024-01-23: qty 180, 15d supply, fill #0
  Filled 2024-01-23: qty 750, 16d supply, fill #0

## 2024-01-23 MED ORDER — PREDNISONE 20 MG PO TABS
ORAL_TABLET | ORAL | 0 refills | Status: DC
  Filled 2024-01-23: qty 19, 13d supply, fill #0

## 2024-01-23 MED ORDER — BREZTRI AEROSPHERE 160-9-4.8 MCG/ACT IN AERO
1.0000 | INHALATION_SPRAY | Freq: Two times a day (BID) | RESPIRATORY_TRACT | 1 refills | Status: AC
  Filled 2024-01-23: qty 10.7, 30d supply, fill #0

## 2024-01-23 MED ORDER — SODIUM CHLORIDE 3 % IN NEBU: 4.0000 mL | INHALATION_SOLUTION | Freq: Three times a day (TID) | RESPIRATORY_TRACT | 0 refills | Status: DC

## 2024-01-23 NOTE — Discharge Summary (Signed)
 Tracy Park:[id number] DOB: [birth year] DOA: 01/13/2024  PCP: Chana Comas, FNP  Admit date: 01/13/2024  Discharge date: 01/23/2024  Admitted From: Home   disposition: Home  Recommendations for Outpatient Follow-up:   Follow up with pulmonologist on 5/27 as already scheduled.   Home Health: N/A Equipment/Devices: N/A Consultations: Pulmonary/PCCM Discharge Condition: Improved CODE STATUS: DNR limited   Chief Complaint  Patient presents with   Shortness of Breath     Brief history of present illness from the day of admission and additional interim summary      Tracy Park is a 68 y.o. female with medical history significant of COPD/emphysema, chronic respiratory failure on 2 L of oxygen , paroxysmal atrial fibrillation on anticoagulation with Eliquis , lung cancer status post left lobectomy, hypertension, hyperlipidemia, hypothyroidism, depression.  She was recently discharged from the hospital on 01/07/2024 After being treated for acute COPD exacerbation.  She had return to the emergency room on 01/10/2024 with similar symptoms of shortness of breath associated with wheezing.  She admitted at that time of not being compliant with her prednisone .  Patient was discharged and advised to resume her prednisone  upon discharge.  Per patient despite being on treatment, her symptoms continue to persist hence decided to come to the emergency room to be further evaluated.  On this visit, stress pills patient was noted to be hypoxic on room air at 82%.  She was referred to hospitalist service for admission and further optimization.   Records were reviewed.  Patient has had multiple admissions over the past few months.  She admitted to cough which is productive of whitish sputum.  She apparently continues to smoke  cigarettes daily.   Labs reviewed with unremarkable except for potassium 3.3.  WBC was 13.6, influenza and COVID were negative.  CT angiogram on 05/03 which ruled out pulm embolism.  Chronic changes in both lungs were noted.  Left upper lobectomy changes were noted.  Chest x-ray this morning shows emphysema or edema.  Patient was given IV Solu-Medrol  125 mg.                                                                  Hospital Course   Was treated for COPD exacerbation.  She was slow to respond to steroids inhaled bronchodilators.  Pulmonary/PCCM were consulted for their assistance.  They noted that she had Pseudomonas in her sputum in October 2024.  She was started on high-dose Levaquin .  She was also started on 3% saline nebulizers to assist with thickness of secretions and clearance of phlegm.  Patient improved slowly and was ready for discharge.  On day of discharge it was noted that the Pseudomonas was resistant to Levaquin .  She was seen by infectious disease consultation who noted that patient did not require any further  treatment for Pseudomonas in the sputum since she had improved despite getting inadequate coverage of Pseudomonas.  Patient is discharged home on slow prednisone  taper and continued Breztri  to follow-up with her pulmonologist in 10 days as already scheduled   Acute on chronic hypoxic respiratory failure Acute exacerbation of COPD/diffuse in fact that it was changes Patient is clinically improving on steroids and inhaled bronchodilators Patient is discharged home on slow prednisone  taper and continue Breztri  Hypertonic saline nebulizers help clear sputum   Chronic/subacute RUL consolidation with H/O Pseudomonas in sputum Patient has been on high-dose Levaquin  for treatment of Pseudomonas However today it appears that Pseudomonas is resistant to Levaquin  ID was consulted, appreciate their recommendations, they note that patient has improved clinically despite treatment  that is not effective for treatment of Pseudomonas. Pseudomonas may well be commensal rather than causing an infection. They recommend discontinuing all antibiotics and discharging patient home with close follow-up with her pulmonologist to follow clinically. Discussed with patient, she is agreeable to this, notes she has a follow-up with her pulmonologist on May 27.   Ongoing tobacco use Strongly urged to stop smoking, patient states she is trying   PAF Rate controlled on metoprolol  Primary stroke prevention on Eliquis    HTN Controlled on present regimen   Hypothyroidism Continue Synthroid      Discharge diagnosis     Principal Problem:   COPD with acute exacerbation and acute on chronic respiratory failure with hypoxia Active Problems:   Malnutrition of moderate degree    Discharge instructions    Discharge Instructions     Discharge instructions   Complete by: As directed    1) You will stay on your prednisone  till you see your Pulmonary physician on 5/27. You will need to get a new prescription for prednisone  at that time, they will decide how quickly they want to taper you off of it depending on how you are doing. Do not stop taking this medication abruptly.  2) I am giving you a new prescription for Breztri  which you have been on before. Please take this regularly.  3) Please use the 3% saline in your nebulizer machine three times a day to help you thin and clear your phlegm. You can decrease or stop using this when you are no longer producing thick phlegm. 4) Make sure your Pulmonary doctors follow your chest CT to make sure it gets back to normal.  5) Most important of all, you must stop smoking.   Increase activity slowly   Complete by: As directed        Discharge Medications   Allergies as of 01/23/2024       Reactions   Red Dye #40 (allura Red) Hives, Itching, Other (See Comments)   Red food dye   Strawberry Extract Hives, Itching   Tomato Hives,  Itching   Aspirin Hives   Tape Rash, Other (See Comments)   Prefers paper tape   Wound Dressing Adhesive Rash        Medication List     STOP taking these medications    benzonatate  100 MG capsule Commonly known as: TESSALON    losartan  25 MG tablet Commonly known as: COZAAR    ondansetron  4 MG disintegrating tablet Commonly known as: ZOFRAN -ODT   oxyCODONE  5 MG immediate release tablet Commonly known as: Roxicodone    oxyCODONE -acetaminophen  5-325 MG tablet Commonly known as: PERCOCET/ROXICET       TAKE these medications    acetaminophen  500 MG tablet Commonly known as: TYLENOL  Take 500  mg by mouth every 6 (six) hours as needed for moderate pain (pain score 4-6).   albuterol  (2.5 MG/3ML) 0.083% nebulizer solution Commonly known as: PROVENTIL  Take 2.5 mg by nebulization every 4 (four) hours as needed for wheezing or shortness of breath.   albuterol  108 (90 Base) MCG/ACT inhaler Commonly known as: VENTOLIN  HFA Inhale 2 puffs into the lungs every 6 (six) hours as needed for wheezing or shortness of breath.   Breztri  Aerosphere 160-9-4.8 MCG/ACT Aero inhaler Generic drug: budesonide -glycopyrrolate -formoterol  Inhale 1 puff into the lungs in the morning and at bedtime.   Eliquis  5 MG Tabs tablet Generic drug: apixaban  Take 1 tablet (5 mg total) by mouth 2 (two) times daily.   gatifloxacin  0.5 % Soln Commonly known as: ZYMAXID  Place 1 drop into the left eye 4 (four) times daily.   levothyroxine  125 MCG tablet Commonly known as: SYNTHROID  Take 125 mcg by mouth daily before breakfast.   magnesium  oxide 400 (240 Mg) MG tablet Commonly known as: MAG-OX Take 400 mg by mouth in the morning.   metoprolol  succinate 25 MG 24 hr tablet Commonly known as: TOPROL -XL Take 12.5 mg by mouth daily. What changed: Another medication with the same name was removed. Continue taking this medication, and follow the directions you see here.   nitroGLYCERIN  0.4 MG SL  tablet Commonly known as: NITROSTAT  Place 0.4 mg under the tongue every 5 (five) minutes as needed for chest pain.   OXYGEN  Inhale 2 L/min into the lungs continuous.   polyvinyl alcohol  1.4 % ophthalmic solution Commonly known as: LIQUIFILM TEARS Place 1 drop into both eyes in the morning, at noon, in the evening, and at bedtime.   prednisoLONE  acetate 1 % ophthalmic suspension Commonly known as: PRED FORTE  Place 1 drop into the left eye 4 (four) times daily.   predniSONE  20 MG tablet Commonly known as: DELTASONE  Take 2 tablets (40 mg total) by mouth daily with breakfast for 3 days, THEN 1.5 tablets (30 mg total) daily with breakfast for 5 days, THEN 1 tablet (20 mg total) daily with breakfast for 5 days. You will need to get a prescription for more prednisone  on 5/27 when you see your pulmonary doctor. Do not stop taking this medication abruptly, you will need to have a slow taper depending on how you are doing.. Start taking on: Jan 23, 2024 What changed:  medication strength See the new instructions.   rosuvastatin  20 MG tablet Commonly known as: CRESTOR  Take 20 mg by mouth daily.   sertraline  25 MG tablet Commonly known as: ZOLOFT  Take 25 mg by mouth in the morning.   sodium chloride  HYPERTONIC 3 % nebulizer solution Take 4 mLs by nebulization 3 (three) times daily for 15 days.   Systane Complete PF 0.6 % Soln Generic drug: Propylene Glycol (PF) Place 1 drop into both eyes 4 (four) times daily as needed.          Major procedures and Radiology Reports - PLEASE review detailed and final reports thoroughly  -      DG Chest 2 View Result Date: 01/13/2024 CLINICAL DATA:  hx of COPD reporting productive cough EXAM: CHEST - 2 VIEW COMPARISON:  None available. FINDINGS: Emphysema. Persistent pleural thickening and patchy airspace opacities in the left upper lung zone, likely scarring. No new airspace consolidation or pneumothorax. Blunting of both costophrenic sulci. No  cardiomegaly. No acute fracture or destructive lesion. IMPRESSION: 1. Emphysema.  No pneumonia or pulmonary edema. 2. Chronic left upper lung zone scarring  and pleural thickening. Electronically Signed   By: Rance Burrows M.D.   On: 01/13/2024 17:50   CT Angio Chest PE W and/or Wo Contrast Result Date: 01/10/2024 CLINICAL DATA:  Shortness of breath and right-sided pain, initial encounter EXAM: CT ANGIOGRAPHY CHEST WITH CONTRAST TECHNIQUE: Multidetector CT imaging of the chest was performed using the standard protocol during bolus administration of intravenous contrast. Multiplanar CT image reconstructions and MIPs were obtained to evaluate the vascular anatomy. RADIATION DOSE REDUCTION: This exam was performed according to the departmental dose-optimization program which includes automated exposure control, adjustment of the mA and/or kV according to patient size and/or use of iterative reconstruction technique. CONTRAST:  80mL OMNIPAQUE  IOHEXOL  350 MG/ML SOLN COMPARISON:  12/13/2023 FINDINGS: Cardiovascular: Thoracic aorta and its branches demonstrate atherosclerotic calcification. No aneurysmal dilatation or dissection is noted. The pulmonary artery shows a normal branching pattern bilaterally. No intraluminal filling defect to suggest pulmonary embolism is seen. Mediastinum/Nodes: Thoracic inlet is within normal limits. Mediastinal shift to the left is noted related to volume loss from prior left upper lobectomy. This is stable from the prior study. No hilar or mediastinal adenopathy is noted. The esophagus is within normal limits. Lungs/Pleura: Lungs are well aerated bilaterally. Diffuse emphysematous changes are identified. Some areas of pleural thickening are noted on the right stable from the prior exam. Stable consolidation in the posterior aspect of the right upper lobe is again seen. Changes of prior left upper lobectomy are noted with volume loss as described. Some stable areas of scarring are noted  in the residual left lower lobe. No focal confluent infiltrate is seen. Upper Abdomen: No acute abnormality. Musculoskeletal: No chest wall abnormality. No acute or significant osseous findings. Review of the MIP images confirms the above findings. IMPRESSION: Postsurgical changes consistent with left upper lobectomy. No evidence of pulmonary embolism. Chronic changes in both lungs back to prior exams in 2025. Aortic Atherosclerosis (ICD10-I70.0) and Emphysema (ICD10-J43.9). Electronically Signed   By: Violeta Grey M.D.   On: 01/10/2024 21:14   DG Chest Portable 1 View Result Date: 01/02/2024 CLINICAL DATA:  Shortness of breath.  History of lung cancer. EXAM: PORTABLE CHEST 1 VIEW COMPARISON:  Chest radiograph dated 12/28/2023. FINDINGS: Stable cardiomediastinal silhouette. Prior left upper lobectomy with chronic scarring and left sided volume loss. Emphysema with bullous changes again noted in the superior segment of the left lower lobe. No focal consolidation, sizeable pleural effusion, or pneumothorax. No acute osseous abnormality. IMPRESSION: 1. No acute cardiopulmonary findings. 2. Emphysema. 3. Prior left upper lobectomy with chronic scarring and left sided volume loss. Electronically Signed   By: Mannie Seek M.D.   On: 01/02/2024 13:57   DG Chest 2 View Result Date: 12/28/2023 CLINICAL DATA:  Shortness of breath for 1 day. History of lung cancer. EXAM: CHEST - 2 VIEW COMPARISON:  Radiograph 12/12/2023 and CTA chest 12/13/2023 FINDINGS: Chronic interstitial coarsening and hyperinflation of the right lung. Chronic scarring/volume loss of the left upper lung with leftward deviation of the trachea. No focal consolidation. Chronic blunting of the left costophrenic angle. No pneumothorax. Stable cardiomediastinal silhouette. No displaced rib fractures. IMPRESSION: Emphysema.  Chronic volume loss/scarring in the left upper lung. Electronically Signed   By: Rozell Cornet M.D.   On: 12/28/2023 17:28     Micro Results    Recent Results (from the past 240 hours)  Resp panel by RT-PCR (RSV, Flu A&B, Covid) Anterior Nasal Swab     Status: None   Collection Time: 01/13/24  5:55 PM  Specimen: Anterior Nasal Swab  Result Value Ref Range Status   SARS Coronavirus 2 by RT PCR NEGATIVE NEGATIVE Final    Comment: (NOTE) SARS-CoV-2 target nucleic acids are NOT DETECTED.  The SARS-CoV-2 RNA is generally detectable in upper respiratory specimens during the acute phase of infection. The lowest concentration of SARS-CoV-2 viral copies this assay can detect is 138 copies/mL. A negative result does not preclude SARS-Cov-2 infection and should not be used as the sole basis for treatment or other patient management decisions. A negative result may occur with  improper specimen collection/handling, submission of specimen other than nasopharyngeal swab, presence of viral mutation(s) within the areas targeted by this assay, and inadequate number of viral copies(<138 copies/mL). A negative result must be combined with clinical observations, patient history, and epidemiological information. The expected result is Negative.  Fact Sheet for Patients:  BloggerCourse.com  Fact Sheet for Healthcare Providers:  SeriousBroker.it  This test is no t yet approved or cleared by the [place]  FDA and  has been authorized for detection and/or diagnosis of SARS-CoV-2 by FDA under an Emergency Use Authorization (EUA). This EUA will remain  in effect (meaning this test can be used) for the duration of the COVID-19 declaration under Section 564(b)(1) of the Act, 21 U.S.C.section 360bbb-3(b)(1), unless the authorization is terminated  or revoked sooner.       Influenza A by PCR NEGATIVE NEGATIVE Final   Influenza B by PCR NEGATIVE NEGATIVE Final    Comment: (NOTE) The Xpert Xpress SARS-CoV-2/FLU/RSV plus assay is intended as an aid in the diagnosis of  influenza from Nasopharyngeal swab specimens and should not be used as a sole basis for treatment. Nasal washings and aspirates are unacceptable for Xpert Xpress SARS-CoV-2/FLU/RSV testing.  Fact Sheet for Patients: BloggerCourse.com  Fact Sheet for Healthcare Providers: SeriousBroker.it  This test is not yet approved or cleared by the [place]  FDA and has been authorized for detection and/or diagnosis of SARS-CoV-2 by FDA under an Emergency Use Authorization (EUA). This EUA will remain in effect (meaning this test can be used) for the duration of the COVID-19 declaration under Section 564(b)(1) of the Act, 21 U.S.C. section 360bbb-3(b)(1), unless the authorization is terminated or revoked.     Resp Syncytial Virus by PCR NEGATIVE NEGATIVE Final    Comment: (NOTE) Fact Sheet for Patients: BloggerCourse.com  Fact Sheet for Healthcare Providers: SeriousBroker.it  This test is not yet approved or cleared by the [place]  FDA and has been authorized for detection and/or diagnosis of SARS-CoV-2 by FDA under an Emergency Use Authorization (EUA). This EUA will remain in effect (meaning this test can be used) for the duration of the COVID-19 declaration under Section 564(b)(1) of the Act, 21 U.S.C. section 360bbb-3(b)(1), unless the authorization is terminated or revoked.  Performed at [facility], [address], [place], [place] [address]   Respiratory (~20 pathogens) panel by PCR     Status: None   Collection Time: 01/18/24 12:57 PM   Specimen: Nasopharyngeal Swab; Respiratory  Result Value Ref Range Status   Adenovirus NOT DETECTED NOT DETECTED Final   Coronavirus 229E NOT DETECTED NOT DETECTED Final    Comment: (NOTE) The Coronavirus on the Respiratory Panel, DOES NOT test for the novel  Coronavirus (2019 nCoV)    Coronavirus HKU1 NOT DETECTED  NOT DETECTED Final   Coronavirus NL63 NOT DETECTED NOT DETECTED Final   Coronavirus OC43 NOT DETECTED NOT DETECTED Final   Metapneumovirus NOT DETECTED NOT DETECTED Final  Rhinovirus / Enterovirus NOT DETECTED NOT DETECTED Final   Influenza A NOT DETECTED NOT DETECTED Final   Influenza B NOT DETECTED NOT DETECTED Final   Parainfluenza Virus 1 NOT DETECTED NOT DETECTED Final   Parainfluenza Virus 2 NOT DETECTED NOT DETECTED Final   Parainfluenza Virus 3 NOT DETECTED NOT DETECTED Final   Parainfluenza Virus 4 NOT DETECTED NOT DETECTED Final   Respiratory Syncytial Virus NOT DETECTED NOT DETECTED Final   Bordetella pertussis NOT DETECTED NOT DETECTED Final   Bordetella Parapertussis NOT DETECTED NOT DETECTED Final   Chlamydophila pneumoniae NOT DETECTED NOT DETECTED Final   Mycoplasma pneumoniae NOT DETECTED NOT DETECTED Final    Comment: Performed at [facility], [address], [place], [place] [address]  Expectorated Sputum Assessment w Gram Stain, Rflx to Resp Cult     Status: None   Collection Time: 01/18/24  2:40 PM   Specimen: Expectorated Sputum  Result Value Ref Range Status   Specimen Description EXPECTORATED SPUTUM  Final   Special Requests NONE  Final   Sputum evaluation   Final    Sputum specimen not acceptable for testing.  Please recollect.   CRITICAL RESULT CALLED TO, READ BACK BY AND VERIFIED WITH: Joie Narrow RN @ [phone] 01/18/24. GILBERTL Performed at [facility], [address], [place], [place] [address]    Report Status 01/18/2024 FINAL  Final  Expectorated Sputum Assessment w Gram Stain, Rflx to Resp Cult     Status: None   Collection Time: 01/19/24  8:50 AM  Result Value Ref Range Status   Specimen Description EXPECTORATED SPUTUM  Final   Special Requests NONE  Final   Sputum evaluation   Final    THIS SPECIMEN IS ACCEPTABLE FOR SPUTUM CULTURE Performed at [facility], [address] [place], [place]  [address]    Report Status 01/19/2024 FINAL  Final  Culture, Respiratory w Gram Stain     Status: None   Collection Time: 01/19/24  8:50 AM  Result Value Ref Range Status   Specimen Description   Final    EXPECTORATED SPUTUM Performed at [facility], [address] [place], [place] [address]    Special Requests   Final    NONE Reflexed from [id number] Performed at [facility], [address] [place], [place] [address]    Gram Stain   Final    NO WBC SEEN RARE GRAM POSITIVE COCCI IN PAIRS RARE GRAM POSITIVE RODS RARE GRAM NEGATIVE RODS Performed at [facility], [address] [place], [place] [address]    Culture RARE PSEUDOMONAS AERUGINOSA  Final   Report Status 01/22/2024 FINAL  Final   Organism ID, Bacteria PSEUDOMONAS AERUGINOSA  Final      Susceptibility   Pseudomonas aeruginosa - MIC*    CEFTAZIDIME 4 SENSITIVE Sensitive     CIPROFLOXACIN 2 RESISTANT Resistant     GENTAMICIN <=1 SENSITIVE Sensitive     IMIPENEM 0.5 SENSITIVE Sensitive     PIP/TAZO 16 SENSITIVE Sensitive ug/mL    * RARE PSEUDOMONAS AERUGINOSA    Today   Subjective    Rayonna Mongillo feels much improved since admission.  Feels ready to go home.  Denies chest pain, shortness of breath or abdominal pain.  Feels they can take care of themselves with the resources they have at home.  Objective   Blood pressure 120/75, pulse 70, temperature 97.9 F (36.6 C), temperature source Oral, resp. rate 18, height  5\' 3"  (1.6 m), weight 46.5 kg, SpO2 100%.   Intake/Output Summary (Last 24 hours) at 01/23/2024 0902 Last data filed at 01/22/2024 1544 Gross per 24 hour  Intake 470 ml  Output --  Net 470 ml    Exam General: Patient appears well and in good spirits sitting up in bed in no acute distress.  Eyes: sclera anicteric, conjuctiva mild injection bilaterally CVS: S1-S2, regular  Respiratory:  decreased air entry bilaterally secondary to decreased inspiratory  effort, rales at bases  GI: NABS, soft, NT  LE: No edema.  Neuro: A/O x 3, Moving all extremities equally with normal strength, CN 3-12 intact, grossly nonfocal.  Psych: patient is logical and coherent, judgement and insight appear normal, mood and affect appropriate to situation.    Data Review   CBC w Diff:  Lab Results  Component Value Date   WBC 12.1 (H) 01/21/2024   HGB 11.9 (L) 01/21/2024   HCT 38.6 01/21/2024   PLT 209 01/21/2024   LYMPHOPCT 13 01/13/2024   BANDSPCT 20 08/18/2021   MONOPCT 5 01/13/2024   EOSPCT 0 01/13/2024   BASOPCT 0 01/13/2024    CMP:  Lab Results  Component Value Date   NA 138 01/21/2024   K 3.7 01/21/2024   CL 100 01/21/2024   CO2 29 01/21/2024   BUN 30 (H) 01/21/2024   CREATININE 0.48 01/21/2024   PROT 6.1 (L) 01/10/2024   ALBUMIN 3.3 (L) 01/10/2024   BILITOT 0.4 01/10/2024   ALKPHOS 53 01/10/2024   AST 16 01/10/2024   ALT 16 01/10/2024  .   Total Time in preparing paper work, data evaluation and todays exam - 35 minutes  Magdalene School M.D on 01/23/2024 at 9:02 AM  [facility]

## 2024-01-23 NOTE — TOC Transition Note (Signed)
 Transition of Care [facility]) - Discharge Note   Patient Details  Name: Tracy Park MRN: [id number] Date of Birth: [birth year]  Transition of Care [facility]) CM/SW Contact:  Kathryn Parish, RN Phone Number: 01/23/2024, 9:09 AM   Clinical Narrative:    No oxygen  requirement changes. TOC signing off.   Final next level of care: Home/Self Care Barriers to Discharge: Barriers Resolved   Patient Goals and CMS Choice Patient states their goals for this hospitalization and ongoing recovery are:: Home   Choice offered to / list presented to : NA      Discharge Placement                       Discharge Plan and Services Additional resources added to the After Visit Summary for   In-house Referral: NA Discharge Planning Services: CM Consult Post Acute Care Choice: NA          DME Arranged: N/A DME Agency: NA       HH Arranged: NA HH Agency: NA        Social Drivers of Health (SDOH) Interventions SDOH Screenings   Food Insecurity: No Food Insecurity (01/14/2024)  Housing: Low Risk  (01/14/2024)  Transportation Needs: No Transportation Needs (01/14/2024)  Utilities: Not At Risk (01/14/2024)  Financial Resource Strain: High Risk (10/08/2023)   Received from [facility]  Physical Activity: Unknown (04/07/2023)   Received from [facility]  Social Connections: Moderately Integrated (01/14/2024)  Recent Concern: Social Connections - Socially Isolated (12/30/2023)  Stress: Stress Concern Present (04/07/2023)   Received from [facility]  Tobacco Use: High Risk (01/13/2024)     Readmission Risk Interventions    01/14/2024    2:11 PM 01/05/2024   12:08 PM 12/30/2023   11:48 AM  Readmission Risk Prevention Plan  Transportation Screening Complete Complete Complete  Medication Review Oceanographer) Complete Complete Complete  PCP or Specialist appointment within 3-5 days of discharge Complete Complete Complete  HRI or Home Care Consult -- Complete Complete  SW Recovery  Care/Counseling Consult -- Complete Complete  Palliative Care Screening Not Applicable Not Applicable Not Applicable  Skilled Nursing Facility Not Applicable Not Applicable Not Applicable

## 2024-01-23 NOTE — Plan of Care (Signed)

## 2024-01-23 NOTE — Progress Notes (Signed)
 NAME:  Tracy Park, MRN:  [id number], DOB:  [birth year], LOS: 10 ADMISSION DATE:  01/13/2024, CONSULTATION DATE:  01/18/24 REFERRING MD:  Rand Burrs, MD, CHIEF COMPLAINT:  COPD exacerbation   History of Present Illness:  Tracy Park is a 68 year old woman, daily smoker with COPD, OSA, paroxysmal atrial fibrillation and stage IIIA LUL lung adenocarcinoma s/p left upper lobectomy 2018 who is admitted for COPD exacerbation.  PCCM consulted for further evaluation. She is followed by the [facility] Pulmonary team. Last seen 11/15/23. Her outpatient regimen includes breztri  2 puffs twice daily and as needed albuterol . Ohtuvayre  nebulizer treatments were considered at last visit.   She has been started on doxycycline  since 5/6, budesonide  0.5mg  nebs twice daily, duonebs q8hrs and solumedrol 40mg  twice daily IV. She continues to have wheezing on exam which prompted consult.   CTA Chest 5/3 is negative for PE. Diffuse emphysematous changes. RUL consolidation is stable compared to 12/13/2023.   Micro review shows history of pseudomonas in her sputum in 06/2023.  Pertinent  Medical History   Past Medical History:  Diagnosis Date   Anginal pain (HCC)    Anxiety    Bipolar disorder (HCC)    Cancer (HCC)    COPD (chronic obstructive pulmonary disease) (HCC)    Dyspnea    Family history of adverse reaction to anesthesia    History of kidney stones    Hydroureteronephrosis 08/16/2021   Hypothyroidism    Lung cancer (HCC)    Myocardial infarction (HCC)    Paroxysmal atrial fibrillation (HCC)    PTSD (post-traumatic stress disorder)    Sleep apnea    Thyroid  disease      Significant Hospital Events: Including procedures, antibiotic start and stop dates in addition to other pertinent events   5/6 admitted for COPD exacerbation 5/11 PCCM consulted  Interim History / Subjective:  Feels better  Objective    Blood pressure 120/75, pulse 70, temperature 97.9 F (36.6 C), temperature source  Oral, resp. rate 18, height 5\' 3"  (1.6 m), weight 46.5 kg, SpO2 97%.    FiO2 (%):  [28 %] 28 %   Intake/Output Summary (Last 24 hours) at 01/23/2024 1030 Last data filed at 01/22/2024 1544 Gross per 24 hour  Intake 260 ml  Output --  Net 260 ml   Filed Weights   01/13/24 1726  Weight: 46.5 kg    Examination: General: chronically ill appearing woman sitting up in bed in NAD HENT: [place]/AT, eyes anicteric Lungs: breathing comfortably on 2L [place], Cardiovascular: S1S2, RRR Abdomen: nondistneded Extremities: no cyanosis Neuro: awake & alert, answering questions appropriately.    Sputum culture: rare GPC, GPR, GNR> pseudomonas> resistant to quinolones, otherwise sensitive  CT chest personally reviewed> no PE. Posterior RUL nodule in an area with emphysema. LUL fibrotic changes.   Assessment & Plan:  Chronic hypoxemic & hypercapnic respiratory failure, 2L baseline O2 COPD with acute exacerbation Cigarette smoker; has been cutting back on her own  Hx of lung cancer, s/p lobectomy RUL small nodule on CT- appears similar to fibrotic changes in left upper lobe  Pseudomonas colonization  Plan: - Recommend 2 week course IV abx in attempt to eradicate Psa, would even consider adding tobramycin neb - ID was consulted and recommended no furhter therapy and patient is being d/c'd today  -flutter valve; recommend prescribing 3% saline nebs at discharge for TID use (sent to [facility] since most pharmacies do not stock this med).  -con't PTA Breztri ; defer to her  Pulmonologist if dropping ICS is needed -wean prednisone  over the next week (30mg  x 2 days, 20mg  x 2 days, 10mg  x 3 days, then stop) -Will need outpatient follow-up with her [facility] pulmonary team, she has appointment later this month  PCCM will sign off.  Best Practice (right click and "Reselect all SmartList Selections" daily)   Per primary  Labs   CBC: Recent Labs  Lab 01/17/24 0526 01/18/24 0513 01/19/24 0414  01/20/24 0429 01/21/24 0435  WBC 12.6* 14.5* 12.0* 13.4* 12.1*  HGB 11.2* 11.3* 11.4* 12.3 11.9*  HCT 36.1 37.3 36.9 39.3 38.6  MCV 94.3 96.1 94.1 93.6 93.9  PLT 221 195 208 210 209    Basic Metabolic Panel: Recent Labs  Lab 01/17/24 0526 01/18/24 0513 01/19/24 0414 01/20/24 0429 01/21/24 0435  NA 137 139 138 138 138  K 4.0 4.3 4.3 4.2 3.7  CL 97* 98 97* 99 100  CO2 32 34* 32 32 29  GLUCOSE 125* 105* 141* 98 88  BUN 26* 30* 36* 32* 30*  CREATININE 0.60 0.70 0.86 0.69 0.48  CALCIUM  8.7* 8.7* 9.0 9.0 8.3*  MG 1.9 2.1 2.1 2.2 2.2   GFR: Estimated Creatinine Clearance: 51.5 mL/min (by C-G formula based on SCr of 0.48 mg/dL). Recent Labs  Lab 01/18/24 0513 01/19/24 0414 01/20/24 0429 01/21/24 0435  WBC 14.5* 12.0* 13.4* 12.1*    Critical care time: n/a    Guerry Leek, MD 01/23/24 10:30 AM [facility]  For contact information, see Amion. If no response to pager, please call PCCM consult pager. After hours, 7PM- 7AM, please call Elink.

## 2024-01-26 ENCOUNTER — Emergency Department (HOSPITAL_COMMUNITY)

## 2024-01-26 ENCOUNTER — Emergency Department (HOSPITAL_COMMUNITY): Admission: EM | Admit: 2024-01-26 | Discharge: 2024-01-26 | Disposition: A | Discharge status: Home / Self Care

## 2024-01-26 DIAGNOSIS — Z7901 Long term (current) use of anticoagulants: Secondary | ICD-10-CM | POA: Diagnosis not present

## 2024-01-26 DIAGNOSIS — J441 Chronic obstructive pulmonary disease with (acute) exacerbation: Secondary | ICD-10-CM | POA: Diagnosis not present

## 2024-01-26 DIAGNOSIS — R059 Cough, unspecified: Secondary | ICD-10-CM | POA: Diagnosis present

## 2024-01-26 DIAGNOSIS — Z7952 Long term (current) use of systemic steroids: Secondary | ICD-10-CM | POA: Diagnosis not present

## 2024-01-26 DIAGNOSIS — Z7951 Long term (current) use of inhaled steroids: Secondary | ICD-10-CM | POA: Diagnosis not present

## 2024-01-26 LAB — BASIC METABOLIC PANEL WITH GFR
Anion gap: 11 (ref 5–15)
BUN: 17 mg/dL (ref 8–23)
CO2: 29 mmol/L (ref 22–32)
Calcium: 9.2 mg/dL (ref 8.9–10.3)
Chloride: 100 mmol/L (ref 98–111)
Creatinine, Ser: 0.43 mg/dL — ABNORMAL LOW (ref 0.44–1.00)
GFR, Estimated: >60 mL/min (ref >60)
Glucose, Bld: 171 mg/dL — ABNORMAL HIGH (ref 70–99)
Potassium: 4.1 mmol/L (ref 3.5–5.1)
Sodium: 140 mmol/L (ref 135–145)

## 2024-01-26 LAB — CBC
HCT: 39.6 % (ref 36.0–46.0)
Hemoglobin: 12.4 g/dL (ref 12.0–15.0)
MCH: 29.4 pg (ref 26.0–34.0)
MCHC: 31.3 g/dL (ref 30.0–36.0)
MCV: 93.8 fL (ref 80.0–100.0)
Platelets: 249 10*3/uL (ref 150–400)
RBC: 4.22 MIL/uL (ref 3.87–5.11)
RDW: 15 % (ref 11.5–15.5)
WBC: 15.9 10*3/uL — ABNORMAL HIGH (ref 4.0–10.5)
nRBC: 0 % (ref 0.0–0.2)

## 2024-01-26 LAB — RESP PANEL BY RT-PCR (RSV, FLU A&B, COVID)  RVPGX2
Influenza A by PCR: NEGATIVE
Influenza B by PCR: NEGATIVE
Resp Syncytial Virus by PCR: NEGATIVE
SARS Coronavirus 2 by RT PCR: NEGATIVE

## 2024-01-26 MED ORDER — IPRATROPIUM-ALBUTEROL 0.5-2.5 (3) MG/3ML IN SOLN
3.0000 mL | Freq: Once | RESPIRATORY_TRACT | Status: AC
  Administered 2024-01-26: 3 mL via RESPIRATORY_TRACT
  Filled 2024-01-26: qty 3

## 2024-01-26 NOTE — ED Triage Notes (Signed)
 PT arrives via EMS from home. Pt reports worsening sob, cough, and wheezing since last night. Hx of COPD and lung cancer. Not currently undergoing treatment for cancer. PT AxOx4. EMS administered 125mg  of solumedrol, albuterol  and atrovent  pta.

## 2024-01-26 NOTE — ED Provider Triage Note (Signed)
 Emergency Medicine Provider Triage Evaluation Note  Tracy Park , a 68 y.o. female  was evaluated in triage.  Pt complains of shortness of breath.  Patient reports history of COPD and prior lung cancer here with concerns of ongoing shortness of breath worsening over the last several days.  Endorses some slight productive coughing but denies any discoloration of the sputum.  No recent fever, chills or bodyaches.  No sick contacts as far she is aware.  Using 2 L via [place] at baseline with no recent increase.  Review of Systems  Positive: As above Negative: As above  Physical Exam  BP (!) 139/107 (BP Location: Left Arm)   Pulse 95   Temp 98.8 F (37.1 C) (Oral)   Resp (!) 22   SpO2 100%  Gen:   Awake, no distress  Resp:  Normal effort, wheezing in all lung fields MSK:   Moves extremities without difficulty  Other:    Medical Decision Making  Medically screening exam initiated at 4:10 PM.  Appropriate orders placed.  [name] was informed that the remainder of the evaluation will be completed by another provider, this initial triage assessment does not replace that evaluation, and the importance of remaining in the ED until their evaluation is complete.     Allah Reason A, PA-C 01/26/24 1612

## 2024-01-26 NOTE — Discharge Instructions (Addendum)
 Please follow-up with your primary doctors.  Return if develop fevers, chills, passout, or develop any new or worsening symptoms that are concerning to you.

## 2024-01-26 NOTE — ED Provider Notes (Signed)
 [place] EMERGENCY DEPARTMENT AT [facility] Provider Note   CSN: [id number] Arrival date & time: 01/26/24  1509     History  Chief Complaint  Patient presents with   COPD   Shortness of Breath    Tracy Park is a 68 y.o. female.  68 year old female presenting emergency department for shortness of breath in the setting of COPD.  This been ongoing for the past several days.  Has been doing breathing treatments with little improvement.  Productive cough, but no change in color.  Using 2 L nasal cannula baseline.  Reports worsening dyspnea on exertion today.   COPD Associated symptoms include shortness of breath.  Shortness of Breath      Home Medications Prior to Admission medications   Medication Sig Start Date End Date Taking? Authorizing Provider  acetaminophen  (TYLENOL ) 500 MG tablet Take 500 mg by mouth every 6 (six) hours as needed for moderate pain (pain score 4-6).    [provider]  albuterol  (PROVENTIL ) (2.5 MG/3ML) 0.083% nebulizer solution Take 2.5 mg by nebulization every 4 (four) hours as needed for wheezing or shortness of breath.    [provider]  albuterol  (VENTOLIN  HFA) 108 (90 Base) MCG/ACT inhaler Inhale 2 puffs into the lungs every 6 (six) hours as needed for wheezing or shortness of breath. 11/07/23   Amin, Ankit C, MD  apixaban  (ELIQUIS ) 5 MG TABS tablet Take 1 tablet (5 mg total) by mouth 2 (two) times daily. 06/06/23   Lorita Rosa, MD  budesonide -glycopyrrolate -formoterol  (BREZTRI  AEROSPHERE) 160-9-4.8 MCG/ACT AERO inhaler Inhale 1 puff into the lungs in the morning and at bedtime. 01/23/24 02/22/24  Chatterjee, Srobona Tublu, MD  gatifloxacin  (ZYMAXID ) 0.5 % SOLN Place 1 drop into the left eye 4 (four) times daily.    [provider]  levothyroxine  (SYNTHROID ) 125 MCG tablet Take 125 mcg by mouth daily before breakfast.    [provider]  magnesium  oxide (MAG-OX) 400 (240 Mg) MG tablet Take 400 mg  by mouth in the morning.    [provider]  metoprolol  succinate (TOPROL -XL) 25 MG 24 hr tablet Take 12.5 mg by mouth daily.    [provider]  nitroGLYCERIN  (NITROSTAT ) 0.4 MG SL tablet Place 0.4 mg under the tongue every 5 (five) minutes as needed for chest pain.    [provider]  OXYGEN  Inhale 2 L/min into the lungs continuous.    [provider]  polyvinyl alcohol  (LIQUIFILM TEARS) 1.4 % ophthalmic solution Place 1 drop into both eyes in the morning, at noon, in the evening, and at bedtime.    [provider]  prednisoLONE  acetate (PRED FORTE ) 1 % ophthalmic suspension Place 1 drop into the left eye 4 (four) times daily. 11/19/23   [provider]  predniSONE  (DELTASONE ) 20 MG tablet Take 2 tablets (40 mg total) by mouth daily with breakfast for 3 days, THEN 1.5 tablets (30 mg total) daily with breakfast for 5 days, THEN 1 tablet (20 mg total) daily with breakfast for 5 days. You will need to get a prescription for more prednisone  on 5/27 when you see your pulmonary doctor. Do not stop taking this medication abruptly, you will need to have a slow taper depending on how you are doing.. 01/23/24 02/05/24  Chatterjee, Srobona Tublu, MD  rosuvastatin  (CRESTOR ) 20 MG tablet Take 20 mg by mouth daily.    [provider]  sertraline  (ZOLOFT ) 25 MG tablet Take 25 mg by mouth in the morning.  [provider]  sodium chloride  HYPERTONIC 3 % nebulizer solution Take 4 mLs by nebulization 3 (three) times daily for 15 days. Vials are single use vials. 01/23/24 02/07/24  Chatterjee, Srobona Tublu, MD  SYSTANE COMPLETE PF 0.6 % SOLN Place 1 drop into both eyes 4 (four) times daily as needed.    [provider]      Allergies    Red dye #40 (allura red), Strawberry extract, Tomato, Aspirin, Tape, and Wound dressing adhesive    Review of Systems   Review of Systems  Respiratory:  Positive for shortness of breath.     Physical  Exam Updated Vital Signs BP 103/72 (BP Location: Left Arm)   Pulse 78   Temp 98.3 F (36.8 C)   Resp 20   SpO2 97%  Physical Exam Vitals and nursing note reviewed.  Constitutional:      General: She is not in acute distress.    Appearance: She is not toxic-appearing.  HENT:     Head: Normocephalic and atraumatic.  Cardiovascular:     Rate and Rhythm: Normal rate and regular rhythm.  Pulmonary:     Effort: Pulmonary effort is normal. No tachypnea or accessory muscle usage.     Breath sounds: Wheezing (mild) present. No decreased breath sounds.  Musculoskeletal:        General: Normal range of motion.     Cervical back: Normal range of motion.  Skin:    General: Skin is warm.     Capillary Refill: Capillary refill takes less than 2 seconds.  Neurological:     Mental Status: She is alert and oriented to person, place, and time.  Psychiatric:        Mood and Affect: Mood normal.        Behavior: Behavior normal.     ED Results / Procedures / Treatments   Labs (all labs ordered are listed, but only abnormal results are displayed) Labs Reviewed  BASIC METABOLIC PANEL WITH GFR - Abnormal; Notable for the following components:      Result Value   Glucose, Bld 171 (*)    Creatinine, Ser 0.43 (*)    All other components within normal limits  CBC - Abnormal; Notable for the following components:   WBC 15.9 (*)    All other components within normal limits  RESP PANEL BY RT-PCR (RSV, FLU A&B, COVID)  [id number]    EKG EKG Interpretation Date/Time:  Monday Jan 26 2024 15:31:43 EDT Ventricular Rate:  99 PR Interval:  135 QRS Duration:  98 QT Interval:  368 QTC Calculation: 473 R Axis:   270  Text Interpretation: Sinus tachycardia Right atrial enlargement Markedly posterior QRS axis RSR' in V1 or V2, probably normal variant Minimal ST depression, lateral leads Minimal ST elevation, anterolateral leads Artifact in lead(s) I II aVR aVL and baseline wander in lead(s) V1 Confirmed  by Elise Guile [phone]) on 01/26/2024 4:30:00 PM  Radiology DG Chest 2 View Result Date: 01/26/2024 CLINICAL DATA:  Shortness of breath. EXAM: CHEST - 2 VIEW COMPARISON:  Multiple prior exams, most recent radiograph 01/13/2024. Most recent CT 01/10/2024 FINDINGS: Chronic left lung volume loss. Left lung postsurgical change with stable pleuroparenchymal opacity at the apex. The heart is normal in size. Background emphysema and hyperinflation. No acute airspace disease. No significant pleural effusion. No pulmonary edema. No pneumothorax. Stable osseous structures. IMPRESSION: 1. No acute chest findings. 2. Chronic left lung volume loss and postsurgical change. 3. Background emphysema. Electronically Signed  By: Chadwick Colonel M.D.   On: 01/26/2024 17:53    Procedures Procedures    Medications Ordered in ED Medications  ipratropium-albuterol  (DUONEB) 0.5-2.5 (3) MG/3ML nebulizer solution 3 mL (3 mLs Nebulization Given 01/26/24 1738)  ipratropium-albuterol  (DUONEB) 0.5-2.5 (3) MG/3ML nebulizer solution 3 mL (3 mLs Nebulization Given 01/26/24 1738)    ED Course/ Medical Decision Making/ A&P Clinical Course as of 01/27/24 0011  Mon Jan 26, 2024  1630 DG Chest 2 View Similar to prior.  [TY]  2009 Resp panel by RT-PCR (RSV, Flu A&B, Covid) Anterior Nasal Swab Flu/COVID/RSV negative. [TY]  2010 CBC(!) Chronic leukocytosis likely secondary to steroid use. [TY]  2010 Basic metabolic panel(!) No significant metabolic derangements. [TY]    Clinical Course User Index [TY] Rolinda Climes, DO                                 Medical Decision Making 68 year old female with COPD presenting emergency department for shortness of breath.  She is afebrile nontachycardic maintaining oxygen  saturation on her home 2 L of oxygen .  Does not appear to be in distress.  Lungs largely clear.  Was given Solu-Medrol  prior to arrival as well as a DuoNeb.  Chest x-ray independently reviewed by myself.  Similar to  prior.  No obvious pneumonia pneumothorax.  No significant metabolic derangements.  Does have a leukocytosis, likely secondary to chronic steroid use.  Was given breathing treatment here.  Ambulated in the department without desaturation.  Stable for discharge at this time.  Amount and/or Complexity of Data Reviewed External Data Reviewed:     Details: recent admissions for COPD Labs: ordered. Decision-making details documented in ED Course. Radiology: ordered. Decision-making details documented in ED Course.  Risk Prescription drug management. Decision regarding hospitalization. Diagnosis or treatment significantly limited by social determinants of health.         Final Clinical Impression(s) / ED Diagnoses Final diagnoses:  COPD exacerbation [facility])    Rx / DC Orders ED Discharge Orders     None         Rolinda Climes, DO 01/27/24 0011

## 2024-01-29 ENCOUNTER — Emergency Department (HOSPITAL_COMMUNITY)

## 2024-01-29 ENCOUNTER — Observation Stay (HOSPITAL_COMMUNITY)
Admission: EM | Admit: 2024-01-29 | Discharge: 2024-01-31 | Disposition: A | Discharge status: Home / Self Care | Attending: Internal Medicine | Admitting: Internal Medicine

## 2024-01-29 ENCOUNTER — Other Ambulatory Visit: Payer: Self-pay

## 2024-01-29 ENCOUNTER — Encounter (HOSPITAL_COMMUNITY): Payer: Self-pay

## 2024-01-29 DIAGNOSIS — Z515 Encounter for palliative care: Secondary | ICD-10-CM | POA: Diagnosis not present

## 2024-01-29 DIAGNOSIS — F17210 Nicotine dependence, cigarettes, uncomplicated: Secondary | ICD-10-CM | POA: Insufficient documentation

## 2024-01-29 DIAGNOSIS — J449 Chronic obstructive pulmonary disease, unspecified: Secondary | ICD-10-CM | POA: Diagnosis present

## 2024-01-29 DIAGNOSIS — J9611 Chronic respiratory failure with hypoxia: Secondary | ICD-10-CM | POA: Diagnosis not present

## 2024-01-29 DIAGNOSIS — J441 Chronic obstructive pulmonary disease with (acute) exacerbation: Principal | ICD-10-CM | POA: Diagnosis present

## 2024-01-29 DIAGNOSIS — Z7982 Long term (current) use of aspirin: Secondary | ICD-10-CM | POA: Insufficient documentation

## 2024-01-29 DIAGNOSIS — I2510 Atherosclerotic heart disease of native coronary artery without angina pectoris: Secondary | ICD-10-CM | POA: Diagnosis not present

## 2024-01-29 DIAGNOSIS — F32A Depression, unspecified: Secondary | ICD-10-CM | POA: Diagnosis present

## 2024-01-29 DIAGNOSIS — F6811 Factitious disorder with predominantly psychological signs and symptoms: Secondary | ICD-10-CM

## 2024-01-29 DIAGNOSIS — J9621 Acute and chronic respiratory failure with hypoxia: Secondary | ICD-10-CM | POA: Diagnosis present

## 2024-01-29 DIAGNOSIS — Z7189 Other specified counseling: Secondary | ICD-10-CM | POA: Diagnosis not present

## 2024-01-29 DIAGNOSIS — R4589 Other symptoms and signs involving emotional state: Secondary | ICD-10-CM

## 2024-01-29 DIAGNOSIS — Z720 Tobacco use: Secondary | ICD-10-CM | POA: Diagnosis present

## 2024-01-29 DIAGNOSIS — E785 Hyperlipidemia, unspecified: Secondary | ICD-10-CM | POA: Diagnosis not present

## 2024-01-29 DIAGNOSIS — E039 Hypothyroidism, unspecified: Secondary | ICD-10-CM | POA: Insufficient documentation

## 2024-01-29 DIAGNOSIS — Z85118 Personal history of other malignant neoplasm of bronchus and lung: Secondary | ICD-10-CM | POA: Diagnosis not present

## 2024-01-29 DIAGNOSIS — R739 Hyperglycemia, unspecified: Secondary | ICD-10-CM | POA: Insufficient documentation

## 2024-01-29 DIAGNOSIS — Z960 Presence of urogenital implants: Secondary | ICD-10-CM | POA: Diagnosis not present

## 2024-01-29 DIAGNOSIS — E876 Hypokalemia: Secondary | ICD-10-CM | POA: Insufficient documentation

## 2024-01-29 DIAGNOSIS — R7303 Prediabetes: Secondary | ICD-10-CM | POA: Diagnosis not present

## 2024-01-29 DIAGNOSIS — F419 Anxiety disorder, unspecified: Secondary | ICD-10-CM | POA: Diagnosis not present

## 2024-01-29 DIAGNOSIS — I10 Essential (primary) hypertension: Secondary | ICD-10-CM | POA: Diagnosis present

## 2024-01-29 DIAGNOSIS — Z7901 Long term (current) use of anticoagulants: Secondary | ICD-10-CM | POA: Insufficient documentation

## 2024-01-29 DIAGNOSIS — Z902 Acquired absence of lung [part of]: Secondary | ICD-10-CM | POA: Diagnosis not present

## 2024-01-29 DIAGNOSIS — D509 Iron deficiency anemia, unspecified: Secondary | ICD-10-CM | POA: Diagnosis not present

## 2024-01-29 DIAGNOSIS — I480 Paroxysmal atrial fibrillation: Secondary | ICD-10-CM | POA: Diagnosis not present

## 2024-01-29 DIAGNOSIS — Z79899 Other long term (current) drug therapy: Secondary | ICD-10-CM | POA: Insufficient documentation

## 2024-01-29 DIAGNOSIS — D649 Anemia, unspecified: Secondary | ICD-10-CM

## 2024-01-29 DIAGNOSIS — R0602 Shortness of breath: Secondary | ICD-10-CM | POA: Diagnosis present

## 2024-01-29 LAB — GLUCOSE, CAPILLARY: Glucose-Capillary: 165 mg/dL — ABNORMAL HIGH (ref 70–99)

## 2024-01-29 LAB — BASIC METABOLIC PANEL WITH GFR
Anion gap: 7 (ref 5–15)
BUN: 12 mg/dL (ref 8–23)
CO2: 29 mmol/L (ref 22–32)
Calcium: 8.4 mg/dL — ABNORMAL LOW (ref 8.9–10.3)
Chloride: 106 mmol/L (ref 98–111)
Creatinine, Ser: 0.45 mg/dL (ref 0.44–1.00)
GFR, Estimated: >60 mL/min (ref >60)
Glucose, Bld: 164 mg/dL — ABNORMAL HIGH (ref 70–99)
Potassium: 3 mmol/L — ABNORMAL LOW (ref 3.5–5.1)
Sodium: 142 mmol/L (ref 135–145)

## 2024-01-29 LAB — CBC
HCT: 31.4 % — ABNORMAL LOW (ref 36.0–46.0)
Hemoglobin: 9.5 g/dL — ABNORMAL LOW (ref 12.0–15.0)
MCH: 29.4 pg (ref 26.0–34.0)
MCHC: 30.3 g/dL (ref 30.0–36.0)
MCV: 97.2 fL (ref 80.0–100.0)
Platelets: 165 10*3/uL (ref 150–400)
RBC: 3.23 MIL/uL — ABNORMAL LOW (ref 3.87–5.11)
RDW: 14.7 % (ref 11.5–15.5)
WBC: 11.3 10*3/uL — ABNORMAL HIGH (ref 4.0–10.5)
nRBC: 0 % (ref 0.0–0.2)

## 2024-01-29 LAB — CBG MONITORING, ED: Glucose-Capillary: 187 mg/dL — ABNORMAL HIGH (ref 70–99)

## 2024-01-29 LAB — RESP PANEL BY RT-PCR (RSV, FLU A&B, COVID)  RVPGX2
Influenza A by PCR: NEGATIVE
Influenza B by PCR: NEGATIVE
Resp Syncytial Virus by PCR: NEGATIVE
SARS Coronavirus 2 by RT PCR: NEGATIVE

## 2024-01-29 LAB — TROPONIN I (HIGH SENSITIVITY): Troponin I (High Sensitivity): 6 ng/L (ref <18)

## 2024-01-29 MED ORDER — IPRATROPIUM-ALBUTEROL 0.5-2.5 (3) MG/3ML IN SOLN
3.0000 mL | RESPIRATORY_TRACT | Status: DC
  Administered 2024-01-29: 3 mL via RESPIRATORY_TRACT
  Filled 2024-01-29: qty 3

## 2024-01-29 MED ORDER — METOPROLOL SUCCINATE ER 25 MG PO TB24
12.5000 mg | ORAL_TABLET | Freq: Every day | ORAL | Status: DC
  Administered 2024-01-30 – 2024-01-31 (×2): 12.5 mg via ORAL
  Filled 2024-01-29 (×2): qty 1

## 2024-01-29 MED ORDER — BUDESON-GLYCOPYRROL-FORMOTEROL 160-9-4.8 MCG/ACT IN AERO
2.0000 | INHALATION_SPRAY | Freq: Two times a day (BID) | RESPIRATORY_TRACT | Status: DC
  Administered 2024-01-30 – 2024-01-31 (×3): 2 via RESPIRATORY_TRACT
  Filled 2024-01-29 (×2): qty 5.9

## 2024-01-29 MED ORDER — ALBUTEROL SULFATE (2.5 MG/3ML) 0.083% IN NEBU: 2.5000 mg | INHALATION_SOLUTION | RESPIRATORY_TRACT | Status: DC | PRN

## 2024-01-29 MED ORDER — SERTRALINE HCL 50 MG PO TABS
25.0000 mg | ORAL_TABLET | Freq: Every day | ORAL | Status: DC
  Administered 2024-01-30 – 2024-01-31 (×2): 25 mg via ORAL
  Filled 2024-01-29 (×2): qty 1

## 2024-01-29 MED ORDER — ACETAMINOPHEN 650 MG RE SUPP: 650.0000 mg | Freq: Four times a day (QID) | RECTAL | Status: DC | PRN

## 2024-01-29 MED ORDER — IPRATROPIUM-ALBUTEROL 0.5-2.5 (3) MG/3ML IN SOLN
3.0000 mL | Freq: Once | RESPIRATORY_TRACT | Status: AC
  Administered 2024-01-29: 3 mL via RESPIRATORY_TRACT
  Filled 2024-01-29: qty 3

## 2024-01-29 MED ORDER — LACTATED RINGERS IV BOLUS
1000.0000 mL | Freq: Once | INTRAVENOUS | Status: AC
  Administered 2024-01-29: 1000 mL via INTRAVENOUS

## 2024-01-29 MED ORDER — PREDNISONE 20 MG PO TABS
40.0000 mg | ORAL_TABLET | Freq: Every day | ORAL | Status: DC
  Administered 2024-01-30 – 2024-01-31 (×2): 40 mg via ORAL
  Filled 2024-01-29 (×2): qty 2

## 2024-01-29 MED ORDER — ACETAMINOPHEN 325 MG PO TABS: 650.0000 mg | ORAL_TABLET | Freq: Four times a day (QID) | ORAL | Status: DC | PRN

## 2024-01-29 MED ORDER — POTASSIUM CHLORIDE CRYS ER 20 MEQ PO TBCR
40.0000 meq | EXTENDED_RELEASE_TABLET | Freq: Once | ORAL | Status: AC
  Administered 2024-01-29: 40 meq via ORAL
  Filled 2024-01-29: qty 2

## 2024-01-29 MED ORDER — APIXABAN 2.5 MG PO TABS
5.0000 mg | ORAL_TABLET | Freq: Two times a day (BID) | ORAL | Status: DC
  Administered 2024-01-29 – 2024-01-31 (×4): 5 mg via ORAL
  Filled 2024-01-29 (×5): qty 2

## 2024-01-29 MED ORDER — POLYVINYL ALCOHOL 1.4 % OP SOLN
1.0000 [drp] | Freq: Four times a day (QID) | OPHTHALMIC | Status: DC
  Administered 2024-01-30 – 2024-01-31 (×4): 1 [drp] via OPHTHALMIC
  Filled 2024-01-29 (×4): qty 15

## 2024-01-29 MED ORDER — ORAL CARE MOUTH RINSE: 15.0000 mL | OROMUCOSAL | Status: DC | PRN

## 2024-01-29 MED ORDER — ROSUVASTATIN CALCIUM 20 MG PO TABS
20.0000 mg | ORAL_TABLET | Freq: Every day | ORAL | Status: DC
Start: 2024-01-30 — End: 2024-01-31
  Administered 2024-01-30 – 2024-01-31 (×2): 20 mg via ORAL
  Filled 2024-01-29: qty 2
  Filled 2024-01-29 (×2): qty 1
  Filled 2024-01-29: qty 2

## 2024-01-29 MED ORDER — INSULIN ASPART 100 UNIT/ML IJ SOLN: 0.0000 [IU] | Freq: Three times a day (TID) | INTRAMUSCULAR | Status: DC

## 2024-01-29 MED ORDER — IPRATROPIUM-ALBUTEROL 0.5-2.5 (3) MG/3ML IN SOLN
3.0000 mL | Freq: Four times a day (QID) | RESPIRATORY_TRACT | Status: DC
  Administered 2024-01-30 – 2024-01-31 (×6): 3 mL via RESPIRATORY_TRACT
  Filled 2024-01-29 (×6): qty 3

## 2024-01-29 MED ORDER — PREDNISOLONE ACETATE 1 % OP SUSP
1.0000 [drp] | Freq: Four times a day (QID) | OPHTHALMIC | Status: DC
  Administered 2024-01-30 – 2024-01-31 (×4): 1 [drp] via OPHTHALMIC
  Filled 2024-01-29: qty 5

## 2024-01-29 MED ORDER — LEVOTHYROXINE SODIUM 25 MCG PO TABS
125.0000 ug | ORAL_TABLET | Freq: Every day | ORAL | Status: DC
  Administered 2024-01-30 – 2024-01-31 (×2): 125 ug via ORAL
  Filled 2024-01-29 (×3): qty 1

## 2024-01-29 NOTE — H&P (Signed)
 History and Physical    Patient: Tracy Park DOB: Jan 07, 1956 DOA: 01/29/2024 DOS: the patient was seen and examined on 01/29/2024 PCP: Chana Comas, FNP  Patient coming from: Home  Chief Complaint:  Chief Complaint  Patient presents with   Shortness of Breath   COPD   HPI: Tracy Park is a 68 y.o. female with medical history significant of chronic hypoxic respiratory failure on 2L [place], COPD, lung cancer s/p left lobectomy, paroxsymal atrial fibrillation on eliquis , CAD, HTN, HLD, hypothyroidism, depression and anxiety presenting with increasing SHOB.   Patient with 16 hospital admissions for COPD exacerbation over the past year, most recent hospitalization earlier this month. She is chronically on 2L [place] at home. She reports increasing SHOB and wheezing for the past 2 days, which prompted her to come to the ED for further evaluation. She has been adherent with her home bronchodilators. Has not required to increase her home oxygen . She denies any fevers, chills, nausea, vomiting, chest pain, palpitations, change in cough, abdominal pain, urinary changes.   Patient given magnesium  sulfate, albuterol  nebs, and methylprednisolone  by EMS en route to ED.  ED course:  Vital signs notable for tachycardia (has since resolved), mild tachypnea. CBC with mild leukocytosis, hgb 9.5 (down from baseline around 11-12). BMP with K 3.0, glucose 164, normal kidney function. Respiratory panel negative for COVID, flu, RSV. Troponin normal at 6. CXR unremarkable. Given duonebs and one dose of PO K 40mEq in ED. Upon walking patient, patient found to have conversational dyspnea despite adequate oxygenation on home 2L [place]. Given history of frequent COPD exacerbations, persistence of wheezing, and conversational dyspnea, [facility] asked to evaluate patient for admission.  Review of Systems: As mentioned in the history of present illness. All other systems reviewed and are negative. Past Medical History:   Diagnosis Date   Anginal pain (HCC)    Anxiety    Bipolar disorder (HCC)    Cancer (HCC)    COPD (chronic obstructive pulmonary disease) (HCC)    Dyspnea    Family history of adverse reaction to anesthesia    History of kidney stones    Hydroureteronephrosis 08/16/2021   Hypothyroidism    Lung cancer (HCC)    Myocardial infarction (HCC)    Paroxysmal atrial fibrillation (HCC)    PTSD (post-traumatic stress disorder)    Sleep apnea    Thyroid  disease    Past Surgical History:  Procedure Laterality Date   ABDOMINAL HYSTERECTOMY     BACK SURGERY     CYSTOSCOPY W/ URETERAL STENT PLACEMENT Right 05/03/2021   Procedure: CYSTOSCOPY WITH RETROGRADE PYELOGRAM/URETERAL STENT PLACEMENT;  Surgeon: Andrez Banker, MD;  Location: [facility];  Service: Urology;  Laterality: Right;   EYE SURGERY     kidney stent     thyroidectomy     Social History:  reports that she has been smoking cigarettes. She has never used smokeless tobacco. She reports that she does not currently use alcohol . She reports that she does not currently use drugs.  Allergies  Allergen Reactions   Red Dye #40 (Allura Red) Hives, Itching and Other (See Comments)    Red food dye   Strawberry Extract Hives and Itching   Tomato Hives and Itching   Aspirin Hives   Tape Rash and Other (See Comments)    Prefers paper tape   Wound Dressing Adhesive Rash    Family History  Problem Relation Age of Onset   Hypertension Mother    Heart failure Mother  Hypertension Father    Diabetes Father    Heart failure Father     Prior to Admission medications   Medication Sig Start Date End Date Taking? Authorizing Provider  acetaminophen  (TYLENOL ) 500 MG tablet Take 500 mg by mouth every 6 (six) hours as needed for moderate pain (pain score 4-6).    [provider]  albuterol  (PROVENTIL ) (2.5 MG/3ML) 0.083% nebulizer solution Take 2.5 mg by nebulization every 4 (four) hours as needed for wheezing or shortness of  breath.    [provider]  albuterol  (VENTOLIN  HFA) 108 (90 Base) MCG/ACT inhaler Inhale 2 puffs into the lungs every 6 (six) hours as needed for wheezing or shortness of breath. 11/07/23   Amin, Ankit C, MD  apixaban  (ELIQUIS ) 5 MG TABS tablet Take 1 tablet (5 mg total) by mouth 2 (two) times daily. 06/06/23   Lorita Rosa, MD  budesonide -glycopyrrolate -formoterol  (BREZTRI  AEROSPHERE) 160-9-4.8 MCG/ACT AERO inhaler Inhale 1 puff into the lungs in the morning and at bedtime. 01/23/24 02/22/24  Chatterjee, Srobona Tublu, MD  gatifloxacin  (ZYMAXID ) 0.5 % SOLN Place 1 drop into the left eye 4 (four) times daily.    [provider]  levothyroxine  (SYNTHROID ) 125 MCG tablet Take 125 mcg by mouth daily before breakfast.    [provider]  magnesium  oxide (MAG-OX) 400 (240 Mg) MG tablet Take 400 mg by mouth in the morning.    [provider]  metoprolol  succinate (TOPROL -XL) 25 MG 24 hr tablet Take 12.5 mg by mouth daily.    [provider]  nitroGLYCERIN  (NITROSTAT ) 0.4 MG SL tablet Place 0.4 mg under the tongue every 5 (five) minutes as needed for chest pain.    [provider]  OXYGEN  Inhale 2 L/min into the lungs continuous.    [provider]  polyvinyl alcohol  (LIQUIFILM TEARS) 1.4 % ophthalmic solution Place 1 drop into both eyes in the morning, at noon, in the evening, and at bedtime.    [provider]  prednisoLONE  acetate (PRED FORTE ) 1 % ophthalmic suspension Place 1 drop into the left eye 4 (four) times daily. 11/19/23   [provider]  predniSONE  (DELTASONE ) 20 MG tablet Take 2 tablets (40 mg total) by mouth daily with breakfast for 3 days, THEN 1.5 tablets (30 mg total) daily with breakfast for 5 days, THEN 1 tablet (20 mg total) daily with breakfast for 5 days. You will need to get a prescription for more prednisone  on 5/27 when you see your pulmonary doctor. Do not stop taking this medication abruptly, you will  need to have a slow taper depending on how you are doing.. 01/23/24 02/05/24  Chatterjee, Srobona Tublu, MD  rosuvastatin  (CRESTOR ) 20 MG tablet Take 20 mg by mouth daily.    [provider]  sertraline  (ZOLOFT ) 25 MG tablet Take 25 mg by mouth in the morning.    [provider]  sodium chloride  HYPERTONIC 3 % nebulizer solution Take 4 mLs by nebulization 3 (three) times daily for 15 days. Vials are single use vials. 01/23/24 02/07/24  Chatterjee, Srobona Tublu, MD  SYSTANE COMPLETE PF 0.6 % SOLN Place 1 drop into both eyes 4 (four) times daily as needed.    [provider]    Physical Exam: Vitals:   01/29/24 1915 01/29/24 1930 01/29/24 2030 01/29/24 2035  BP: 110/74 112/77 115/76   Pulse: 89 86 81   Resp:  20 (!) 22   Temp:    97.7 F (36.5 C)  TempSrc:  Oral  SpO2: 95% 96% 96%   Weight:      Height:       Physical Exam Constitutional:      Comments: Chronically ill-appearing elderly female, laying in bed, NAD.  HENT:     Head: Normocephalic and atraumatic.     Mouth/Throat:     Mouth: Mucous membranes are dry.     Pharynx: Oropharynx is clear. No oropharyngeal exudate.  Eyes:     General: No scleral icterus.    Extraocular Movements: Extraocular movements intact.     Conjunctiva/sclera: Conjunctivae normal.     Pupils: Pupils are equal, round, and reactive to light.  Cardiovascular:     Rate and Rhythm: Normal rate and regular rhythm.     Heart sounds: Normal heart sounds. No murmur heard.    No friction rub. No gallop.  Pulmonary:     Effort: Pulmonary effort is normal. No respiratory distress.     Breath sounds: No rhonchi or rales.     Comments: Bilateral expiratory wheezing noted on exam. Saturating >93% on home 2L [place]. Abdominal:     General: Bowel sounds are normal. There is no distension.     Palpations: Abdomen is soft.     Tenderness: There is no abdominal tenderness. There is no guarding or rebound.  Musculoskeletal:        General:  No swelling. Normal range of motion.     Cervical back: Normal range of motion.  Skin:    General: Skin is warm and dry.  Neurological:     General: No focal deficit present.     Mental Status: She is alert and oriented to person, place, and time.  Psychiatric:        Mood and Affect: Mood normal.        Behavior: Behavior normal.     Data Reviewed:  There are no new results to review at this time.    Latest Ref Rng & Units 01/29/2024    5:20 PM 01/26/2024    3:42 PM 01/21/2024    4:35 AM  CBC  WBC 4.0 - 10.5 K/uL 11.3  15.9  12.1   Hemoglobin 12.0 - 15.0 g/dL 9.5  09.6  04.5   Hematocrit 36.0 - 46.0 % 31.4  39.6  38.6   Platelets 150 - 400 K/uL 165  249  209       Latest Ref Rng & Units 01/29/2024    6:25 PM 01/26/2024    3:42 PM 01/21/2024    4:35 AM  BMP  Glucose 70 - 99 mg/dL 409  811  88   BUN 8 - 23 mg/dL 12  17  30    Creatinine 0.44 - 1.00 mg/dL 9.14  7.82  9.56   Sodium 135 - 145 mmol/L 142  140  138   Potassium 3.5 - 5.1 mmol/L 3.0  4.1  3.7   Chloride 98 - 111 mmol/L 106  100  100   CO2 22 - 32 mmol/L 29  29  29    Calcium  8.9 - 10.3 mg/dL 8.4  9.2  8.3    DG Chest 2 View Result Date: 01/29/2024 CLINICAL DATA:  Cough and shortness of breath EXAM: CHEST - 2 VIEW COMPARISON:  Chest x-ray 01/26/2024 FINDINGS: Pleuroparenchymal opacities and volume loss in the left lung apex appear unchanged from prior. The lungs are otherwise clear. No pleural effusion or pneumothorax. The cardiomediastinal silhouette is within normal limits. Tracheal deviation to the left is unchanged. No acute  fractures are seen. IMPRESSION: 1. Stable chronic changes in the left lung apex. 2. No acute cardiopulmonary process. Electronically Signed   By: Tyron Gallon M.D.   On: 01/29/2024 19:00     Assessment and Plan: No notes have been filed under this hospital service. Service: Hospitalist  COPD exacerbation Chronic hypoxic respiratory failure on 2L [place] at home Hx of lung cancer s/p left  lobectomy Patient with frequent hospitalizations for COPD exacerbation, presenting with worsening shortness of breath and wheezing.  She received magnesium  sulfate, albuterol  nebs, methylprednisolone  via EMS and received DuoNebs in the ED.  Despite this, she continues to have persistent wheezing and conversational dyspnea.  Fortunately she is saturating well on her home 2 L [place].  Mild leukocytosis on labs likely from steroids.  No other signs or symptoms of infection.  Respiratory panel negative for COVID, flu, RSV.  She is hemodynamically stable. - Resume home Breztri  - DuoNebs scheduled every 4 hours - 1 L IV fluid bolus for rehydration - Continue prednisone  for 4 more days - Follow-up respiratory viral panel, droplet precautions until this results - Supplemental oxygen  as needed to maintain O2 sats greater than 88% - PT/OT eval, flutter valve - Place in observation/telemetry  Paroxysmal atrial fibrillation Initially mildly tachycardic but rates have since remained in the 80s. - Resume home Eliquis  - Telemetry - Resume home Toprol -XL  Hypertension Blood pressures ranging in the 110s-120s/70s-80s.  Patient is on Toprol -XL 12.5 mg daily at home. - Resume Toprol -XL from tomorrow  Hypokalemia - Potassium 3.0 on BMP today - Received dose of oral potassium 40 mEq in ED - Will provide 1 additional dose of oral potassium 40 mill equivalent - Trend potassium level, replete as necessary  Acute on chronic normocytic anemia Baseline hemoglobin appears to be around 11.5-12.  On admission, hemoglobin 9.5.  No reported bleeding events per patient.  Will trend hemoglobin curve while on Eliquis . - Trend hemoglobin curve, transfuse if hemoglobin less than 7  Prediabetes with hyperglycemia A1c 5.7% in May 2024. Not on any medications for this in the outpatient setting. Blood glucose 164 on BMP today. CBG 187. Likely impacted by steroid use with frequent COPD exacerbations. -f/u A1c -sensitive  SSI -trend CBGs, goal 140-180  CAD Hyperlipidemia - Resume home Eliquis  - Resume home Crestor   Hypothyroidism - Resume home Synthroid   Depression and anxiety - Resume home Zoloft     Advance Care Planning:   Code Status: Limited: Do not attempt resuscitation (DNR) -DNR-LIMITED -Do Not Intubate/DNI  Confirmed at bedside. Patient is AAOx3 and able to make this decision.  Consults: none  Family Communication: no family at bedside  Severity of Illness: The appropriate patient status for this patient is OBSERVATION. Observation status is judged to be reasonable and necessary in order to provide the required intensity of service to ensure the patient's safety. The patient's presenting symptoms, physical exam findings, and initial radiographic and laboratory data in the context of their medical condition is felt to place them at decreased risk for further clinical deterioration. Furthermore, it is anticipated that the patient will be medically stable for discharge from the hospital within 2 midnights of admission.   Portions of this note were generated with Scientist, clinical (histocompatibility and immunogenetics). Dictation errors may occur despite best attempts at proofreading.  Author: Mandy Second, MD 01/29/2024 9:57 PM  For on call review [email or web address].

## 2024-01-29 NOTE — ED Notes (Signed)
 Pt ambulated approximately 20 feet on 2lnc. Spo2 remained 93-96%. Pt reports sob did worsen with exertion.

## 2024-01-29 NOTE — ED Provider Notes (Signed)
 [facility] AT [facility] Provider Note  CSN: [id number] Arrival date & time: 01/29/24 1635  Chief Complaint(s) Shortness of Breath and COPD  HPI Tracy Park is a 68 y.o. female who is here today for shortness of breath.  Patient history of COPD, is on 2 L nasal cannula.  Patient denies any cough or fever.  She does endorse chest pain.  She is on Eliquis .   Past Medical History Past Medical History:  Diagnosis Date   Anginal pain (HCC)    Anxiety    Bipolar disorder (HCC)    Cancer (HCC)    COPD (chronic obstructive pulmonary disease) (HCC)    Dyspnea    Family history of adverse reaction to anesthesia    History of kidney stones    Hydroureteronephrosis 08/16/2021   Hypothyroidism    Lung cancer (HCC)    Myocardial infarction (HCC)    Paroxysmal atrial fibrillation (HCC)    PTSD (post-traumatic stress disorder)    Sleep apnea    Thyroid  disease    Patient Active Problem List   Diagnosis Date Noted   Malnutrition of moderate degree 01/15/2024   Chronic respiratory failure with hypoxia (HCC) 12/13/2023   COPD with acute exacerbation and acute on chronic respiratory failure with hypoxia 09/22/2023   History of depression 08/03/2023   Left-sided chest pain 08/03/2023   Rib pain on left side 06/24/2023   Food insecurity 06/05/2023   Left foot pain 04/12/2023   Acute exacerbation of chronic obstructive pulmonary disease (COPD) (HCC) 03/06/2023   Hypocalcemia 03/06/2023   Generalized anxiety disorder 03/06/2023   Paroxysmal atrial fibrillation (HCC) 02/23/2023   Paroxysmal atrial fibrillation with RVR (HCC) 01/25/2023   Pulmonary nodule 01/25/2023   COPD (chronic obstructive pulmonary disease) (HCC) 01/25/2023   Essential hypertension 01/25/2023   Thrombocytosis 03/26/2022   COPD exacerbation (HCC) 03/25/2022   DDD (degenerative disc disease), cervical 03/24/2022   Bipolar disorder (HCC) 03/24/2022   Septic shock (HCC) 03/14/2022    Sepsis due to pneumonia (HCC) 03/13/2022   Dyslipidemia 03/13/2022   Anxiety and depression and bipolar 03/13/2022   History of lung cancer 03/13/2022   Tobacco use 03/13/2022   Pneumonia 02/27/2022   Adenocarcinoma of lung (HCC) 02/26/2022   Leukocytosis 02/26/2022   Acute on chronic respiratory failure with hypoxia (HCC) 02/26/2022   Normocytic anemia 02/08/2022   Protein-calorie malnutrition, severe 08/25/2021   Influenza A with pneumonia 08/16/2021   Sepsis (HCC) 08/16/2021   Hyponatremia 08/16/2021   Hypokalemia 08/16/2021   Hypomagnesemia 08/16/2021   Hydronephrosis 05/03/2021   Acute encephalopathy 05/02/2021   CAP (community acquired pneumonia) 04/10/2020   Coronary artery disease involving native coronary artery of native heart without angina pectoris 08/18/2018   Malignant neoplasm of upper lobe of left lung (HCC) 11/12/2016   Chronic bilateral low back pain without sciatica 08/21/2016   Obstructive sleep apnea (adult) (pediatric) 02/22/2015   Hyperlipidemia 06/03/2013   Vitamin D  deficiency 06/03/2013   Hypothyroidism 04/28/2012   Nicotine  abuse 04/28/2012   Chest pain, atypical 04/28/2012   Home Medication(s) Prior to Admission medications   Medication Sig Start Date End Date Taking? Authorizing Provider  acetaminophen  (TYLENOL ) 500 MG tablet Take 500 mg by mouth every 6 (six) hours as needed for moderate pain (pain score 4-6).    [provider]  albuterol  (PROVENTIL ) (2.5 MG/3ML) 0.083% nebulizer solution Take 2.5 mg by nebulization every 4 (four) hours as needed for wheezing or shortness of breath.    [provider]  albuterol  (VENTOLIN  HFA) 108 (90 Base) MCG/ACT inhaler Inhale 2 puffs into the lungs every 6 (six) hours as needed for wheezing or shortness of breath. 11/07/23   Amin, Ankit C, MD  apixaban  (ELIQUIS ) 5 MG TABS tablet Take 1 tablet (5 mg total) by mouth 2 (two) times daily. 06/06/23   Lorita Rosa, MD   budesonide -glycopyrrolate -formoterol  (BREZTRI  AEROSPHERE) 160-9-4.8 MCG/ACT AERO inhaler Inhale 1 puff into the lungs in the morning and at bedtime. 01/23/24 02/22/24  Chatterjee, Srobona Tublu, MD  gatifloxacin  (ZYMAXID ) 0.5 % SOLN Place 1 drop into the left eye 4 (four) times daily.    [provider]  levothyroxine  (SYNTHROID ) 125 MCG tablet Take 125 mcg by mouth daily before breakfast.    [provider]  magnesium  oxide (MAG-OX) 400 (240 Mg) MG tablet Take 400 mg by mouth in the morning.    [provider]  metoprolol  succinate (TOPROL -XL) 25 MG 24 hr tablet Take 12.5 mg by mouth daily.    [provider]  nitroGLYCERIN  (NITROSTAT ) 0.4 MG SL tablet Place 0.4 mg under the tongue every 5 (five) minutes as needed for chest pain.    [provider]  OXYGEN  Inhale 2 L/min into the lungs continuous.    [provider]  polyvinyl alcohol  (LIQUIFILM TEARS) 1.4 % ophthalmic solution Place 1 drop into both eyes in the morning, at noon, in the evening, and at bedtime.    [provider]  prednisoLONE  acetate (PRED FORTE ) 1 % ophthalmic suspension Place 1 drop into the left eye 4 (four) times daily. 11/19/23   [provider]  predniSONE  (DELTASONE ) 20 MG tablet Take 2 tablets (40 mg total) by mouth daily with breakfast for 3 days, THEN 1.5 tablets (30 mg total) daily with breakfast for 5 days, THEN 1 tablet (20 mg total) daily with breakfast for 5 days. You will need to get a prescription for more prednisone  on 5/27 when you see your pulmonary doctor. Do not stop taking this medication abruptly, you will need to have a slow taper depending on how you are doing.. 01/23/24 02/05/24  Chatterjee, Srobona Tublu, MD  rosuvastatin  (CRESTOR ) 20 MG tablet Take 20 mg by mouth daily.    [provider]  sertraline  (ZOLOFT ) 25 MG tablet Take 25 mg by mouth in the morning.    [provider]  sodium chloride  HYPERTONIC 3 % nebulizer  solution Take 4 mLs by nebulization 3 (three) times daily for 15 days. Vials are single use vials. 01/23/24 02/07/24  Chatterjee, Srobona Tublu, MD  SYSTANE COMPLETE PF 0.6 % SOLN Place 1 drop into both eyes 4 (four) times daily as needed.    [provider]                                                                                                                                    Past Surgical History Past Surgical History:  Procedure Laterality  Date   ABDOMINAL HYSTERECTOMY     BACK SURGERY     CYSTOSCOPY W/ URETERAL STENT PLACEMENT Right 05/03/2021   Procedure: CYSTOSCOPY WITH RETROGRADE PYELOGRAM/URETERAL STENT PLACEMENT;  Surgeon: Andrez Banker, MD;  Location: [facility];  Service: Urology;  Laterality: Right;   EYE SURGERY     kidney stent     thyroidectomy     Family History Family History  Problem Relation Age of Onset   Hypertension Mother    Heart failure Mother    Hypertension Father    Diabetes Father    Heart failure Father     Social History Social History   Tobacco Use   Smoking status: Every Day    Current packs/day: 0.15    Types: Cigarettes   Smokeless tobacco: Never  Vaping Use   Vaping status: Never Used  Substance Use Topics   Alcohol  use: Not Currently   Drug use: Not Currently   Allergies Red dye #40 (allura red), Strawberry extract, Tomato, Aspirin, Tape, and Wound dressing adhesive  Review of Systems Review of Systems  Physical Exam Vital Signs  I have reviewed the triage vital signs BP 101/66   Pulse 89   Temp 98.5 F (36.9 C) (Oral)   Resp 20   Ht 5\' 3"  (1.6 m)   Wt 53.3 kg   SpO2 96%   BMI 20.82 kg/m   Physical Exam Vitals and nursing note reviewed.  HENT:     Head: Normocephalic.  Pulmonary:     Effort: Tachypnea present.     Breath sounds: Wheezing present. No decreased breath sounds, rhonchi or rales.  Musculoskeletal:        General: Normal range of motion.  Neurological:     General: No focal deficit  present.     ED Results and Treatments Labs (all labs ordered are listed, but only abnormal results are displayed) Labs Reviewed  CBC - Abnormal; Notable for the following components:      Result Value   WBC 11.3 (*)    RBC 3.23 (*)    Hemoglobin 9.5 (*)    HCT 31.4 (*)    All other components within normal limits  BASIC METABOLIC PANEL WITH GFR - Abnormal; Notable for the following components:   Potassium 3.0 (*)    Glucose, Bld 164 (*)    Calcium  8.4 (*)    All other components within normal limits  RESP PANEL BY RT-PCR (RSV, FLU A&B, COVID)  RVPGX2  TROPONIN I (HIGH SENSITIVITY)                                                                                                                          Radiology DG Chest 2 View Result Date: 01/29/2024 CLINICAL DATA:  Cough and shortness of breath EXAM: CHEST - 2 VIEW COMPARISON:  Chest x-ray 01/26/2024 FINDINGS: Pleuroparenchymal opacities and volume loss in the left lung apex appear unchanged from prior. The lungs are otherwise clear. No pleural effusion  or pneumothorax. The cardiomediastinal silhouette is within normal limits. Tracheal deviation to the left is unchanged. No acute fractures are seen. IMPRESSION: 1. Stable chronic changes in the left lung apex. 2. No acute cardiopulmonary process. Electronically Signed   By: Tyron Gallon M.D.   On: 01/29/2024 19:00    Pertinent labs & imaging results that were available during my care of the patient were reviewed by me and considered in my medical decision making (see MDM for details).  Medications Ordered in ED Medications  potassium chloride  SA (KLOR-CON  M) CR tablet 40 mEq (has no administration in time range)  ipratropium-albuterol  (DUONEB) 0.5-2.5 (3) MG/3ML nebulizer solution 3 mL (3 mLs Nebulization Given 01/29/24 1736)                                                                                                                                      Procedures Procedures  (including critical care time)  Medical Decision Making / ED Course   This patient presents to the ED for concern of shortness of breath, this involves an extensive number of treatment options, and is a complaint that carries with it a high risk of complications and morbidity.  The differential diagnosis includes COPD exacerbation, less likely ACS, likely PE, less likely pneumonia.  MDM: Patient's lung sounds on exam are consistent with a COPD exacerbation.  She received steroids, magnesium , albuterol  by EMS.  Will provide some additional breathing treatments here in the ED for the patient.  Patient currently saturating 97% on her typical 2 L of oxygen .  Will attempt to ambulate the patient if we are able to get her work of breathing improved.  Reassessment 7:10 PM-ambulated the patient in the ED.  O2 sats did remain greater than 93%, however patient was extremely winded, unable to speak in complete sentences during this period given her severity of symptoms, believe she requires admission for continued management of her COPD.   Additional history obtained: -Additional history obtained from EMS -External records from outside source obtained and reviewed including: Chart review including previous notes, labs, imaging, consultation notes   Lab Tests: -I ordered, reviewed, and interpreted labs.   The pertinent results include:   Labs Reviewed  CBC - Abnormal; Notable for the following components:      Result Value   WBC 11.3 (*)    RBC 3.23 (*)    Hemoglobin 9.5 (*)    HCT 31.4 (*)    All other components within normal limits  BASIC METABOLIC PANEL WITH GFR - Abnormal; Notable for the following components:   Potassium 3.0 (*)    Glucose, Bld 164 (*)    Calcium  8.4 (*)    All other components within normal limits  RESP PANEL BY RT-PCR (RSV, FLU A&B, COVID)  RVPGX2  TROPONIN I (HIGH SENSITIVITY)      EKG sinus tachycardia, no acute ischemia  EKG  Interpretation Date/Time:  Ventricular Rate:    PR Interval:    QRS Duration:    QT Interval:    QTC Calculation:   R Axis:      Text Interpretation:           Imaging Studies ordered: I ordered imaging studies including chest x-ray I independently visualized and interpreted imaging. I agree with the radiologist interpretation   Medicines ordered and prescription drug management: Meds ordered this encounter  Medications   ipratropium-albuterol  (DUONEB) 0.5-2.5 (3) MG/3ML nebulizer solution 3 mL   potassium chloride  SA (KLOR-CON  M) CR tablet 40 mEq    -I have reviewed the patients home medicines and have made adjustments as needed   Cardiac Monitoring: The patient was maintained on a cardiac monitor.  I personally viewed and interpreted the cardiac monitored which showed an underlying rhythm of: Sinus rhythm  Social Determinants of Health:  Factors impacting patients care include: Multiple medical comorbidities including COPD   Reevaluation: After the interventions noted above, I reevaluated the patient and found that they have :improved  Co morbidities that complicate the patient evaluation  Past Medical History:  Diagnosis Date   Anginal pain (HCC)    Anxiety    Bipolar disorder (HCC)    Cancer (HCC)    COPD (chronic obstructive pulmonary disease) (HCC)    Dyspnea    Family history of adverse reaction to anesthesia    History of kidney stones    Hydroureteronephrosis 08/16/2021   Hypothyroidism    Lung cancer (HCC)    Myocardial infarction (HCC)    Paroxysmal atrial fibrillation (HCC)    PTSD (post-traumatic stress disorder)    Sleep apnea    Thyroid  disease       Dispostion: Admission     Final Clinical Impression(s) / ED Diagnoses Final diagnoses:  COPD exacerbation (HCC)     @PCDICTATION @    Afton Horse T, DO 01/29/24 1915

## 2024-01-29 NOTE — ED Triage Notes (Signed)
 PT arrives via EMS from home. Pt reports ongoing sob, cough, and wheezing. PT was seen here on the 19th for the same. EMS administered 2 duonebs, 5mg  of albuterol , 2 grams of mag, and 125mg  of solumedrol. Pt arrives AxOx4. Hx of copd and lung cancer.

## 2024-01-29 NOTE — Hospital Course (Addendum)
 HPI: Tracy Park is a 68 y.o. female with medical history significant of chronic hypoxic respiratory failure on 2L [place], COPD, lung cancer s/p left lobectomy, paroxsymal atrial fibrillation on eliquis , CAD, HTN, HLD, hypothyroidism, depression and anxiety presenting with increasing SHOB.    Patient with 16 hospital admissions for COPD exacerbation over the past year, most recent hospitalization earlier this month. She is chronically on 2L [place] at home. She reports increasing SHOB and wheezing for the past 2 days, which prompted her to come to the ED for further evaluation. She has been adherent with her home bronchodilators. Has not required to increase her home oxygen . She denies any fevers, chills, nausea, vomiting, chest pain, palpitations, change in cough, abdominal pain, urinary changes.    Patient given magnesium  sulfate, albuterol  nebs, and methylprednisolone  by EMS en route to ED.   ED course:  Vital signs notable for tachycardia (has since resolved), mild tachypnea. CBC with mild leukocytosis, hgb 9.5 (down from baseline around 11-12). BMP with K 3.0, glucose 164, normal kidney function. Respiratory panel negative for COVID, flu, RSV. Troponin normal at 6. CXR unremarkable. Given duonebs and one dose of PO K 40mEq in ED. Upon walking patient, patient found to have conversational dyspnea despite adequate oxygenation on home 2L [place]. Given history of frequent COPD exacerbations, persistence of wheezing, and conversational dyspnea, TR[facility] asked to evaluate patient for admission.  Significant Events: Admitted 01/29/2024 for COPD exacerbation   Admission Labs: WBC 11.3, HgB 9.5, plt 165 Na 142, K 3.0, CO2 of 29, BUN 12, Scr 0.45, glu 164 Covid/flu/rsv negative RVP negative  Admission Imaging Studies: CXR Stable chronic changes in the left lung apex. 2. No acute cardiopulmonary process  Significant Labs:   Significant Imaging Studies:   Antibiotic Therapy: Anti-infectives (From admission,  onward)    None       Procedures:   Consultants: Palliative care

## 2024-01-30 ENCOUNTER — Encounter (HOSPITAL_COMMUNITY): Payer: Self-pay | Admitting: Student

## 2024-01-30 DIAGNOSIS — J9621 Acute and chronic respiratory failure with hypoxia: Secondary | ICD-10-CM | POA: Diagnosis not present

## 2024-01-30 DIAGNOSIS — J441 Chronic obstructive pulmonary disease with (acute) exacerbation: Principal | ICD-10-CM

## 2024-01-30 DIAGNOSIS — F6811 Factitious disorder with predominantly psychological signs and symptoms: Secondary | ICD-10-CM

## 2024-01-30 DIAGNOSIS — Z720 Tobacco use: Secondary | ICD-10-CM

## 2024-01-30 DIAGNOSIS — R4589 Other symptoms and signs involving emotional state: Secondary | ICD-10-CM

## 2024-01-30 DIAGNOSIS — Z515 Encounter for palliative care: Secondary | ICD-10-CM

## 2024-01-30 DIAGNOSIS — Z7189 Other specified counseling: Secondary | ICD-10-CM

## 2024-01-30 LAB — RESPIRATORY PANEL BY PCR

## 2024-01-30 LAB — CBC
HCT: 36.2 % (ref 36.0–46.0)
Hemoglobin: 10.8 g/dL — ABNORMAL LOW (ref 12.0–15.0)
MCH: 29.5 pg (ref 26.0–34.0)
MCHC: 29.8 g/dL — ABNORMAL LOW (ref 30.0–36.0)
MCV: 98.9 fL (ref 80.0–100.0)
Platelets: 178 10*3/uL (ref 150–400)
RBC: 3.66 MIL/uL — ABNORMAL LOW (ref 3.87–5.11)
RDW: 14.6 % (ref 11.5–15.5)
WBC: 8.2 10*3/uL (ref 4.0–10.5)
nRBC: 0 % (ref 0.0–0.2)

## 2024-01-30 LAB — HEMOGLOBIN A1C
Hgb A1c MFr Bld: 5.5 % (ref 4.8–5.6)
Mean Plasma Glucose: 111.15 mg/dL

## 2024-01-30 LAB — GLUCOSE, CAPILLARY
Glucose-Capillary: 99 mg/dL (ref 70–99)
Glucose-Capillary: 108 mg/dL — ABNORMAL HIGH (ref 70–99)

## 2024-01-30 LAB — BASIC METABOLIC PANEL WITH GFR
Anion gap: 8 (ref 5–15)
BUN: 15 mg/dL (ref 8–23)
CO2: 24 mmol/L (ref 22–32)
Calcium: 8.5 mg/dL — ABNORMAL LOW (ref 8.9–10.3)
Chloride: 105 mmol/L (ref 98–111)
Creatinine, Ser: 0.43 mg/dL — ABNORMAL LOW (ref 0.44–1.00)
GFR, Estimated: >60 mL/min (ref >60)
Glucose, Bld: 141 mg/dL — ABNORMAL HIGH (ref 70–99)
Potassium: 4.5 mmol/L (ref 3.5–5.1)
Sodium: 137 mmol/L (ref 135–145)

## 2024-01-30 MED ORDER — SENNA 8.6 MG PO TABS
1.0000 | ORAL_TABLET | Freq: Two times a day (BID) | ORAL | Status: DC
  Administered 2024-01-30 – 2024-01-31 (×2): 8.6 mg via ORAL
  Filled 2024-01-30 (×3): qty 1

## 2024-01-30 MED ORDER — OXYCODONE HCL 5 MG PO TABS
5.0000 mg | ORAL_TABLET | ORAL | Status: DC | PRN
  Administered 2024-01-30 – 2024-01-31 (×3): 5 mg via ORAL
  Filled 2024-01-30 (×3): qty 1

## 2024-01-30 NOTE — Assessment & Plan Note (Addendum)
 01-30-2024 pt claming that she is feeling "too weak" and "unable to walk".  Claims she "only walked a few feet" today.  Verified with mobility tech that pt walked 460 feet today. Pt stating may feel too weak to go home.  Also states she doesn't know why her COPD is always getting bad despite her continuing to smoke cigarettes.  Pt verifies she knows that she has COPD, had had lung cancer requiring left upper lobectomy. She states she knows that smoking is bad for her and yet is surprised when her COPD flares when she smokes.  01-31-2024 pt walked 80 feet prior to DC today with mobility specialist. This is longer than any distance she needs to walk in her small home.

## 2024-01-30 NOTE — Assessment & Plan Note (Addendum)
 01-30-2024 on prednisone  40 mg daily. Verified with pt that she has home O2 machine. Uses mask instead of  at home. Has home nebulizer machine. Pt questions why she keeps getting admitted for COPD exacerbations without acknowledging that her continued abuse of cigarettes may be playing an active role in her repeated hospitalization. Pt takes no responsibility for her actions.  Furthermore, pt claimed she only "walked a few feet" today with mobility tech. I personally verified with mobility tech that pt walked 460 feet today!.  Have asked palliative care team to see pt in consult to discuss her GOC since pt has been admitted 17 times in the last 12 months for repeated COPD exacerbations.  01-31-2024 on 2 L/min O2. On PO prednisone . Pt seen by palliative care. On prn oxycodone  for air hunger. I don't think it's appropriate for new pulmonary consult to determine if pt has end-stage COPD during acute exacerbation. Pt has an already well established pulmonologist that she had been seeing. She will need to f/u with Dr. Javaid with [facility] in [place]. Pt requesting new pulmonlogist with [place] [facility]. I can put in referral but pt needs to f/u with her established pulmonary provider at [facility].  I think the biggest issue is pt's non-compliance with tobacco cessation. Pt continues to smoke despite her severe lung disease, chronic hypoxia on supplemental O2 and prior lobectomy for cancer. She takes absolutely no ownership in her behavior as the cause of her frequent exacerbations. Instead she focuses on blaming the healthcare system and provider on her frequent hospitalizations.  Clearly, when she is not allowed to smoke, her lung function improves within only a few days. Pt is stable for DC.

## 2024-01-30 NOTE — Progress Notes (Signed)
 Mobility Specialist - Progress Note   01/30/24 0915  Oxygen  Therapy  SpO2 98 %  O2 Device Nasal Cannula  O2 Flow Rate (L/min) 2 L/min  Patient Activity (if Appropriate) Ambulating  Mobility  Activity Ambulated independently in hallway  Level of Assistance Independent  Assistive Device None  Distance Ambulated (ft) 460 ft  Activity Response Tolerated well  Mobility Referral Yes  Mobility visit 1 Mobility  Mobility Specialist Start Time (ACUTE ONLY) [id number]  Mobility Specialist Stop Time (ACUTE ONLY) 0914  Mobility Specialist Time Calculation (min) (ACUTE ONLY) 20 min   Pt received in bed and agreeable to mobility. No complaints during session. Pt to EOB for meal after session with all needs met.    [facility]

## 2024-01-30 NOTE — Subjective & Objective (Addendum)
 Pt seen and examined. Pt seen by palliative care. Started on some oxycodone  for air hunger. Remains on baseline O2 @ 2 L/min. On po prednisone .

## 2024-01-30 NOTE — Assessment & Plan Note (Addendum)
 01-30-2024 chronically on 2 L/min continuously.  01-31-2024 stable

## 2024-01-30 NOTE — Assessment & Plan Note (Addendum)
 01-30-2024 continues to smoke cigarettes despite chronic respiratory failure on home O2, prior lung cancer requiring lobectomy and 17 hospitalizations in the last 12 months for COPD exacerbation. Pt states that nicotine  patches don't work for her. Pt REFUSES to take Chantix because "of what it did to my husband".  Pt herself has never tried Chantix. Refuses any pharmaceutical medications to help quit smoking. Pt states she "only smokes 3 cigarettes a day".  01-31-2024 pt refuses to use/try/attempt any form of tobacco cessation aids.  I think the biggest issue is pt's non-compliance with tobacco cessation. Pt continues to smoke despite her severe lung disease, chronic hypoxia on supplemental O2 and prior lobectomy for cancer. She takes absolutely no ownership in her behavior as the cause of her frequent exacerbations. Instead she focuses on blaming the healthcare system and provider on her frequent hospitalizations. Clearly, when she is not allowed to smoke, her lung function improves within only a few days. Pt is stable for DC.

## 2024-01-30 NOTE — Plan of Care (Signed)

## 2024-01-30 NOTE — Assessment & Plan Note (Addendum)
 01-30-2024 back down to 2 L/min [place]. Chronically on 2 L/min.  01-31-2024 after she is not allowed to smoke in hospital, pt quickly weaned back to 2 L/min.

## 2024-01-30 NOTE — Assessment & Plan Note (Addendum)
 01-30-2024 stable. On Toprol -XL 12.5 mg daily.  01-31-2024 stable

## 2024-01-30 NOTE — Progress Notes (Signed)
 PROGRESS NOTE    Tracy Park  YQM:[id number] DOB: November 27, 1955 DOA: 01/29/2024 PCP: Chana Comas, FNP  Subjective: Pt seen and examined. Chart reviewed.   Hospital Course: HPI: Tracy Park is a 68 y.o. female with medical history significant of chronic hypoxic respiratory failure on 2L [place], COPD, lung cancer s/p left lobectomy, paroxsymal atrial fibrillation on eliquis , CAD, HTN, HLD, hypothyroidism, depression and anxiety presenting with increasing SHOB.    Patient with 16 hospital admissions for COPD exacerbation over the past year, most recent hospitalization earlier this month. She is chronically on 2L [place] at home. She reports increasing SHOB and wheezing for the past 2 days, which prompted her to come to the ED for further evaluation. She has been adherent with her home bronchodilators. Has not required to increase her home oxygen . She denies any fevers, chills, nausea, vomiting, chest pain, palpitations, change in cough, abdominal pain, urinary changes.    Patient given magnesium  sulfate, albuterol  nebs, and methylprednisolone  by EMS en route to ED.   ED course:  Vital signs notable for tachycardia (has since resolved), mild tachypnea. CBC with mild leukocytosis, hgb 9.5 (down from baseline around 11-12). BMP with K 3.0, glucose 164, normal kidney function. Respiratory panel negative for COVID, flu, RSV. Troponin normal at 6. CXR unremarkable. Given duonebs and one dose of PO K 40mEq in ED. Upon walking patient, patient found to have conversational dyspnea despite adequate oxygenation on home 2L [place]. Given history of frequent COPD exacerbations, persistence of wheezing, and conversational dyspnea, [facility] asked to evaluate patient for admission.  Significant Events: Admitted 01/29/2024 for COPD exacerbation   Admission Labs: WBC 11.3, HgB 9.5, plt 165 Na 142, K 3.0, CO2 of 29, BUN 12, Scr 0.45, glu 164 Covid/flu/rsv negative RVP negative  Admission Imaging Studies: CXR Stable  chronic changes in the left lung apex. 2. No acute cardiopulmonary process  Significant Labs:   Significant Imaging Studies:   Antibiotic Therapy: Anti-infectives (From admission, onward)    None       Procedures:   Consultants: Palliative care    Assessment and Plan: * COPD with acute exacerbation 01-30-2024 on prednisone  40 mg daily. Verified with pt that she has home O2 machine. Uses mask instead of [place] at home. Has home nebulizer machine. Pt questions why she keeps getting admitted for COPD exacerbations without acknowledging that her continued abuse of cigarettes may be playing an active role in her repeated hospitalization. Pt takes no responsibility for her actions.  Furthermore, pt claimed she only "walked a few feet" today with mobility tech. I personally verified with mobility tech that pt walked 460 feet today!.  Have asked palliative care team to see pt in consult to discuss her GOC since pt has been admitted 17 times in the last 12 months for repeated COPD exacerbations.  Factitious disorder with predominantly psychological signs and symptoms 01-30-2024 pt claming that she is feeling "too weak" and "unable to walk".  Claims she "only walked a few feet" today.  Verified with mobility tech that pt walked 460 feet today. Pt stating may feel too weak to go home.  Also states she doesn't know why her COPD is always getting bad despite her continuing to smoke cigarettes.  Pt verifies she knows that she has COPD, had had lung cancer requiring left upper lobectomy. She states she knows that smoking is bad for her and yet is surprised when her COPD flares when she smokes.  Tobacco use 01-30-2024 continues to smoke cigarettes despite  chronic respiratory failure on home O2, prior lung cancer requiring lobectomy and 17 hospitalizations in the last 12 months for COPD exacerbation. Pt states that nicotine  patches don't work for her. Pt REFUSES to take Chantix because "of what it did to  my husband".  Pt herself has never tried Chantix. Refuses any pharmaceutical medications to help quit smoking. Pt states she "only smokes 3 cigarettes a day".  Acute on chronic respiratory failure with hypoxia (HCC) 02/27/2024 back down to 2 L/min [place]. Chronically on 2 L/min.  Chronic respiratory failure with hypoxia (HCC) 2024-02-27 chronically on 2 L/min continuously.  Paroxysmal atrial fibrillation (HCC) 02-27-24 stable on eliquis  and Toprol -XL 12.5 mg daily.  Essential hypertension 2024-02-27 stable. On Toprol -XL 12.5 mg daily.  Anxiety and depression Feb 27, 2024 pt states she husband died 2 years ago. Lives with her sister in [place].  DVT prophylaxis: apixaban  (ELIQUIS ) tablet 5 mg Start: 01/29/24 2200 apixaban  (ELIQUIS ) tablet 5 mg     Code Status: Limited: Do not attempt resuscitation (DNR) -DNR-LIMITED -Do Not Intubate/DNI  Family Communication: no family at bedside. Pt is decisional. Disposition Plan: return home Reason for continuing need for hospitalization: medically stable for DC  Objective: Vitals:   02-27-2024 0450 27-Feb-2024 0915 02-27-24 0945 02/27/2024 1047  BP: 108/72   119/74  Pulse: 70   68  Resp: 20   20  Temp: 98.8 F (37.1 C)   98.3 F (36.8 C)  TempSrc:    Oral  SpO2: 100% 98% 99% 100%  Weight:      Height:       No intake or output data in the 24 hours ending 02-27-24 1218 Filed Weights   01/29/24 1817  Weight: 53.3 kg    Examination:  Physical Exam Vitals and nursing note reviewed.  Constitutional:      General: She is not in acute distress.    Appearance: She is not toxic-appearing.     Comments: Chronically ill  HENT:     Head: Normocephalic and atraumatic.  Eyes:     General: No scleral icterus. Cardiovascular:     Rate and Rhythm: Normal rate and regular rhythm.  Pulmonary:     Effort: Pulmonary effort is normal. No respiratory distress.     Comments: Soft wheezes bilaterally No distress Abdominal:     General: Abdomen is flat.  Bowel sounds are normal.     Palpations: Abdomen is soft.  Musculoskeletal:     Right lower leg: No edema.     Left lower leg: No edema.  Skin:    General: Skin is warm and dry.     Capillary Refill: Capillary refill takes less than 2 seconds.  Neurological:     Mental Status: She is alert and oriented to person, place, and time.     Data Reviewed: I have personally reviewed following labs and imaging studies  CBC: Recent Labs  Lab 01/26/24 1542 01/29/24 1720 02/27/24 0532  WBC 15.9* 11.3* 8.2  HGB 12.4 9.5* 10.8*  HCT 39.6 31.4* 36.2  MCV 93.8 97.2 98.9  PLT 249 165 178   Basic Metabolic Panel: Recent Labs  Lab 01/26/24 1542 01/29/24 1825 Feb 27, 2024 0532  NA 140 142 137  K 4.1 3.0* 4.5  CL 100 106 105  CO2 29 29 24   GLUCOSE 171* 164* 141*  BUN 17 12 15   CREATININE 0.43* 0.45 0.43*  CALCIUM  9.2 8.4* 8.5*   GFR: Estimated Creatinine Clearance: 58 mL/min (A) (by C-G formula based on SCr of 0.43 mg/dL (L)).  BNP (last 3 results) Recent Labs    12/28/23 1728 01/02/24 1300 01/15/24 0448  BNP 76.4 124.9* 95.3   HbA1C: Recent Labs    01/29/24 1728  HGBA1C 5.5   CBG: Recent Labs  Lab 01/29/24 2149 01/29/24 2231 01/30/24 0741 01/30/24 1139  GLUCAP 187* 165* 99 108*    Recent Results (from the past 240 hours)  Resp panel by RT-PCR (RSV, Flu A&B, Covid) Anterior Nasal Swab     Status: None   Collection Time: 01/26/24  4:35 PM   Specimen: Anterior Nasal Swab  Result Value Ref Range Status   SARS Coronavirus 2 by RT PCR NEGATIVE NEGATIVE Final    Comment: (NOTE) SARS-CoV-2 target nucleic acids are NOT DETECTED.  The SARS-CoV-2 RNA is generally detectable in upper respiratory specimens during the acute phase of infection. The lowest concentration of SARS-CoV-2 viral copies this assay can detect is 138 copies/mL. A negative result does not preclude SARS-Cov-2 infection and should not be used as the sole basis for treatment or other patient management  decisions. A negative result may occur with  improper specimen collection/handling, submission of specimen other than nasopharyngeal swab, presence of viral mutation(s) within the areas targeted by this assay, and inadequate number of viral copies(<138 copies/mL). A negative result must be combined with clinical observations, patient history, and epidemiological information. The expected result is Negative.  Fact Sheet for Patients:  BloggerCourse.com  Fact Sheet for Healthcare Providers:  SeriousBroker.it  This test is no t yet approved or cleared by the [place]  FDA and  has been authorized for detection and/or diagnosis of SARS-CoV-2 by FDA under an Emergency Use Authorization (EUA). This EUA will remain  in effect (meaning this test can be used) for the duration of the COVID-19 declaration under Section 564(b)(1) of the Act, 21 U.S.C.section 360bbb-3(b)(1), unless the authorization is terminated  or revoked sooner.       Influenza A by PCR NEGATIVE NEGATIVE Final   Influenza B by PCR NEGATIVE NEGATIVE Final    Comment: (NOTE) The Xpert Xpress SARS-CoV-2/FLU/RSV plus assay is intended as an aid in the diagnosis of influenza from Nasopharyngeal swab specimens and should not be used as a sole basis for treatment. Nasal washings and aspirates are unacceptable for Xpert Xpress SARS-CoV-2/FLU/RSV testing.  Fact Sheet for Patients: BloggerCourse.com  Fact Sheet for Healthcare Providers: SeriousBroker.it  This test is not yet approved or cleared by the [place]  FDA and has been authorized for detection and/or diagnosis of SARS-CoV-2 by FDA under an Emergency Use Authorization (EUA). This EUA will remain in effect (meaning this test can be used) for the duration of the COVID-19 declaration under Section 564(b)(1) of the Act, 21 U.S.C. section 360bbb-3(b)(1), unless the  authorization is terminated or revoked.     Resp Syncytial Virus by PCR NEGATIVE NEGATIVE Final    Comment: (NOTE) Fact Sheet for Patients: BloggerCourse.com  Fact Sheet for Healthcare Providers: SeriousBroker.it  This test is not yet approved or cleared by the [place]  FDA and has been authorized for detection and/or diagnosis of SARS-CoV-2 by FDA under an Emergency Use Authorization (EUA). This EUA will remain in effect (meaning this test can be used) for the duration of the COVID-19 declaration under Section 564(b)(1) of the Act, 21 U.S.C. section 360bbb-3(b)(1), unless the authorization is terminated or revoked.  Performed at [facility], [address], [place], [place] [address]   Resp panel by RT-PCR (RSV, Flu A&B, Covid) Anterior Nasal Swab  Status: None   Collection Time: 01/29/24  7:24 PM   Specimen: Anterior Nasal Swab  Result Value Ref Range Status   SARS Coronavirus 2 by RT PCR NEGATIVE NEGATIVE Final    Comment: (NOTE) SARS-CoV-2 target nucleic acids are NOT DETECTED.  The SARS-CoV-2 RNA is generally detectable in upper respiratory specimens during the acute phase of infection. The lowest concentration of SARS-CoV-2 viral copies this assay can detect is 138 copies/mL. A negative result does not preclude SARS-Cov-2 infection and should not be used as the sole basis for treatment or other patient management decisions. A negative result may occur with  improper specimen collection/handling, submission of specimen other than nasopharyngeal swab, presence of viral mutation(s) within the areas targeted by this assay, and inadequate number of viral copies(<138 copies/mL). A negative result must be combined with clinical observations, patient history, and epidemiological information. The expected result is Negative.  Fact Sheet for Patients:   BloggerCourse.com  Fact Sheet for Healthcare Providers:  SeriousBroker.it  This test is no t yet approved or cleared by the [place]  FDA and  has been authorized for detection and/or diagnosis of SARS-CoV-2 by FDA under an Emergency Use Authorization (EUA). This EUA will remain  in effect (meaning this test can be used) for the duration of the COVID-19 declaration under Section 564(b)(1) of the Act, 21 U.S.C.section 360bbb-3(b)(1), unless the authorization is terminated  or revoked sooner.       Influenza A by PCR NEGATIVE NEGATIVE Final   Influenza B by PCR NEGATIVE NEGATIVE Final    Comment: (NOTE) The Xpert Xpress SARS-CoV-2/FLU/RSV plus assay is intended as an aid in the diagnosis of influenza from Nasopharyngeal swab specimens and should not be used as a sole basis for treatment. Nasal washings and aspirates are unacceptable for Xpert Xpress SARS-CoV-2/FLU/RSV testing.  Fact Sheet for Patients: BloggerCourse.com  Fact Sheet for Healthcare Providers: SeriousBroker.it  This test is not yet approved or cleared by the [place]  FDA and has been authorized for detection and/or diagnosis of SARS-CoV-2 by FDA under an Emergency Use Authorization (EUA). This EUA will remain in effect (meaning this test can be used) for the duration of the COVID-19 declaration under Section 564(b)(1) of the Act, 21 U.S.C. section 360bbb-3(b)(1), unless the authorization is terminated or revoked.     Resp Syncytial Virus by PCR NEGATIVE NEGATIVE Final    Comment: (NOTE) Fact Sheet for Patients: BloggerCourse.com  Fact Sheet for Healthcare Providers: SeriousBroker.it  This test is not yet approved or cleared by the [place]  FDA and has been authorized for detection and/or diagnosis of SARS-CoV-2 by FDA under an Emergency Use  Authorization (EUA). This EUA will remain in effect (meaning this test can be used) for the duration of the COVID-19 declaration under Section 564(b)(1) of the Act, 21 U.S.C. section 360bbb-3(b)(1), unless the authorization is terminated or revoked.  Performed at [facility], [address] [place], [place] [address]   Respiratory (~20 pathogens) panel by PCR     Status: None   Collection Time: 01/29/24 11:13 PM   Specimen: Nasopharyngeal Swab; Respiratory  Result Value Ref Range Status   Adenovirus NOT DETECTED NOT DETECTED Final   Coronavirus 229E NOT DETECTED NOT DETECTED Final    Comment: (NOTE) The Coronavirus on the Respiratory Panel, DOES NOT test for the novel  Coronavirus (2019 nCoV)    Coronavirus HKU1 NOT DETECTED NOT DETECTED Final   Coronavirus NL63 NOT DETECTED NOT DETECTED Final   Coronavirus OC43 NOT DETECTED  NOT DETECTED Final   Metapneumovirus NOT DETECTED NOT DETECTED Final   Rhinovirus / Enterovirus NOT DETECTED NOT DETECTED Final   Influenza A NOT DETECTED NOT DETECTED Final   Influenza B NOT DETECTED NOT DETECTED Final   Parainfluenza Virus 1 NOT DETECTED NOT DETECTED Final   Parainfluenza Virus 2 NOT DETECTED NOT DETECTED Final   Parainfluenza Virus 3 NOT DETECTED NOT DETECTED Final   Parainfluenza Virus 4 NOT DETECTED NOT DETECTED Final   Respiratory Syncytial Virus NOT DETECTED NOT DETECTED Final   Bordetella pertussis NOT DETECTED NOT DETECTED Final   Bordetella Parapertussis NOT DETECTED NOT DETECTED Final   Chlamydophila pneumoniae NOT DETECTED NOT DETECTED Final   Mycoplasma pneumoniae NOT DETECTED NOT DETECTED Final    Comment: Performed at [facility], [address], [place], [place] [address]     Radiology Studies: DG Chest 2 View Result Date: 01/29/2024 CLINICAL DATA:  Cough and shortness of breath EXAM: CHEST - 2 VIEW COMPARISON:  Chest x-ray 01/26/2024 FINDINGS: Pleuroparenchymal opacities and volume loss in the  left lung apex appear unchanged from prior. The lungs are otherwise clear. No pleural effusion or pneumothorax. The cardiomediastinal silhouette is within normal limits. Tracheal deviation to the left is unchanged. No acute fractures are seen. IMPRESSION: 1. Stable chronic changes in the left lung apex. 2. No acute cardiopulmonary process. Electronically Signed   By: Tyron Gallon M.D.   On: 01/29/2024 19:00    Scheduled Meds:  apixaban   5 mg Oral BID   budesonide -glycopyrrolate -formoterol   2 puff Inhalation BID   insulin aspart  0-9 Units Subcutaneous TID WC   ipratropium-albuterol   3 mL Nebulization QID   levothyroxine   125 mcg Oral Q0600   metoprolol  succinate  12.5 mg Oral Daily   polyvinyl alcohol   1 drop Both Eyes QID   prednisoLONE  acetate  1 drop Left Eye QID   predniSONE   40 mg Oral Q breakfast   rosuvastatin   20 mg Oral Daily   sertraline   25 mg Oral Daily   Continuous Infusions:   LOS: 0 days   Time spent: 60 minutes  Unk Garb, DO  [facility]  01/30/2024, 12:18 PM

## 2024-01-30 NOTE — Assessment & Plan Note (Addendum)
 01-30-2024 stable on eliquis  and Toprol -XL 12.5 mg daily.  01-31-2024 stable

## 2024-01-30 NOTE — Progress Notes (Signed)
 I spent time with Tracy Park to provide emotional and spiritual support.  She shared that her [place] in God is what is helping her to get through.  She knows that she will be here until it is "her time" and feels open to the support of Hospice, especially because she has limited support.  She was drowsy from some medication she had taken and hopes to get some rest this afternoon.

## 2024-01-30 NOTE — Progress Notes (Signed)
   Verified with mobility tech that pt did in fact walk 460 feet today in the hallway.  Pt claims she only walked "a little". And states she only walked past 2 doors from her room. Unk Garb, DO [facility]

## 2024-01-30 NOTE — Assessment & Plan Note (Addendum)
 01-30-2024 pt states she husband died 2 years ago. Lives with her sister in [place].  01-31-2024 chronic. Pt needs to find an mental health professional to help her deal with her grief.

## 2024-01-30 NOTE — Consult Note (Signed)
 Consultation Note Date: 01/30/2024   Patient Name: Tracy Park  DOB: [birth year]  MRN: [id number]  Age / Sex: 68 y.o., female   PCP: Chana Comas, FNP Referring Physician: Unk Garb, DO  Reason for Consultation: Establishing goals of care     Chief Complaint/History of Present Illness:   Patient is a 68 year old female with a past medical history of chronic hypoxic respiratory failure on 2 L nasal cannula, lung cancer status post left lobectomy, COPD, tobacco use disorder, paroxysmal atrial fibrillation on Eliquis , CAD, hypertension, hyperlipidemia, hypothyroidism, depression, and anxiety who was admitted on 01/27/2024 for management of shortness of breath.  Patient is noted to have 16 hospital admissions for COPD exacerbations over the past year.  Patient currently receiving management for this again.  Palliative medicine team consulted to assist with complex medical decision making.  Extensive review of EMR prior to presenting to bedside.  As per chart review, patient had testing showing FEV1 at 45% in June 2024.  Cannot find recent FEV1 testing.  Reviewed outpatient pulmonologist note from 12/05/2023.  Note does not state stage of COPD, based on FEV1 a year ago patient would be stage III based on Gold criteria.  Recent BMP noting GFR >60.  No leukocytosis noted with WBC at 8.2.  Presented to bedside to see patient.  Patient sitting up comfortably in bed.  No visitors present at bedside.  Able to introduce myself as a member of the palliative medicine team around patient's medical journey.  Spent time listening to patient about her medical journey and multiple hospitalizations.  Patient does feel that coming back to the hospital helps to improve her breathing.  Patient does not like that she goes home for only a short period of time and quickly returns to the hospital.   Inquired about home support.  Patient noted that she lives with her sister who also recently got out of the hospital.   Patient noted she has 2 daughters though 1 she does not know where she is due to daughter having polysubstance use issues and the other lives down the road so has told patient that "you are too much for me to deal with" so patient barely sees her patient noted lack of support especially since her husband died 2 years ago.  Patient noted that even the church family she had no longer comes to visit and this is very distressing for her.  Patient is a very faithful person.  Patient notes that the only strength she has at this time is from God.  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  Empathized with difficult situation. Tried to inquire about what brings patient joy at home.  Patient notes that the only thing she is really able to do because of her shortness of breath is watch TV and that also becomes mind numbing.  Acknowledged this.  Noted during conversation patient became tearful.  Patient noted that she is not usually emotional though it is difficult to say strong on what time.  Encourage patient to expression emotions as is human.  Provided emotional support via active listening.  Patient hopes to be able to enjoy time at home and outside of the hospital.  Patient noted that she takes her inhalers though was told "that is all that can be done".  Inquired if patient has had discussion with pulmonologist if she is in stage or if there are any  further medications that can be used for management.  Patient noted that she has not had this conversation with her pulmonologist.  Patient noted that it can be difficult to go see her pulmonologist who is 45 minutes away. Inquired if patient had ever taken opioids to assist with shortness of breath.  Patient noted that she has previously taken them for pain though not for shortness of breath.  Discussed starting oxycodone  5 mg every 4 hours as needed for shortness of breath to  see if will assist with symptom management.  Patient agreeing with this plan.  Also noted would reach out to hospitalist about possible input from pulmonology to further help determine if patient has further medical therapies available or if patient is end-stage COPD.  With permission, able to discuss with patient that if she is end-stage COPD, would need to focus on supporting her time and comfort at home with hospice.  Patient noted that her husband had hospice when he died.  Spent time answering questions as able.  Noted palliative medicine team to continue following patient's medical journey.  Discussed care with hospitalist, RN, and TOC to coordinate care.  Primary Diagnoses  Present on Admission:  COPD with acute exacerbation and acute on chronic respiratory failure with hypoxia   Palliative Review of Systems: Shortness of breath  Past Medical History:  Diagnosis Date   Anginal pain (HCC)    Anxiety    Bipolar disorder (HCC)    Cancer (HCC)    COPD (chronic obstructive pulmonary disease) (HCC)    Dyspnea    Family history of adverse reaction to anesthesia    History of kidney stones    Hydroureteronephrosis 08/16/2021   Hypothyroidism    Lung cancer (HCC)    Myocardial infarction (HCC)    Paroxysmal atrial fibrillation (HCC)    PTSD (post-traumatic stress disorder)    Sleep apnea    Thyroid  disease    Social History   Socioeconomic History   Marital status: Single    Spouse name: Not on file   Number of children: Not on file   Years of education: Not on file   Highest education level: Not on file  Occupational History   Not on file  Tobacco Use   Smoking status: Every Day    Current packs/day: 0.15    Types: Cigarettes   Smokeless tobacco: Never  Vaping Use   Vaping status: Never Used  Substance and Sexual Activity   Alcohol  use: Not Currently   Drug use: Not Currently   Sexual activity: Not on file  Other Topics Concern   Not on file  Social History  Narrative   Lives with daughter   Social Drivers of Health   Financial Resource Strain: High Risk (10/08/2023)   Received from [facility]   Overall Financial Resource Strain (CARDIA)    Difficulty of Paying Living Expenses: Very hard  Food Insecurity: No Food Insecurity (01/29/2024)   Hunger Vital Sign    Worried About Running Out of Food in the Last Year: Never true    Ran Out of Food in the Last Year: Never true  Transportation Needs: No Transportation Needs (01/29/2024)   PRAPARE - Administrator, Civil Service (Medical): No    Lack of Transportation (Non-Medical): No  Physical Activity: Unknown (04/07/2023)   Received from [facility]   Exercise Vital Sign    Days of Exercise per Week: 0 days    Minutes of Exercise per Session: Not on file  Stress: Stress Concern Present (04/07/2023)   Received from [facility] Occupational [facility] - Occupational Stress Questionnaire    Feeling of Stress : To some extent  Social Connections: Moderately Integrated (01/29/2024)   Social Connection and Isolation Panel [NHANES]    Frequency of Communication with Friends and Family: More than three times a week    Frequency of Social Gatherings with Friends and Family: Once a week    Attends Religious Services: 1 to 4 times per year    Active Member of Clubs or Organizations: Yes    Attends Banker Meetings: 1 to 4 times per year    Marital Status: Never married  Recent Concern: Social Connections - Socially Isolated (12/30/2023)   Social Connection and Isolation Panel [NHANES]    Frequency of Communication with Friends and Family: Never    Frequency of Social Gatherings with Friends and Family: Never    Attends Religious Services: Never    Database administrator or Organizations: No    Attends Engineer, structural: Never    Marital Status: Never married   Family History  Problem Relation Age of Onset   Hypertension Mother    Heart  failure Mother    Hypertension Father    Diabetes Father    Heart failure Father    Scheduled Meds:  apixaban   5 mg Oral BID   budesonide -glycopyrrolate -formoterol   2 puff Inhalation BID   insulin aspart  0-9 Units Subcutaneous TID WC   ipratropium-albuterol   3 mL Nebulization QID   levothyroxine   125 mcg Oral Q0600   metoprolol  succinate  12.5 mg Oral Daily   polyvinyl alcohol   1 drop Both Eyes QID   prednisoLONE  acetate  1 drop Left Eye QID   predniSONE   40 mg Oral Q breakfast   rosuvastatin   20 mg Oral Daily   sertraline   25 mg Oral Daily   Continuous Infusions: PRN Meds:.acetaminophen  **OR** acetaminophen , albuterol , mouth rinse Allergies  Allergen Reactions   Red Dye #40 (Allura Red) Hives, Itching and Other (See Comments)    Red food dye   Strawberry Extract Hives and Itching   Tomato Hives and Itching   Aspirin Hives   Tape Rash and Other (See Comments)    Prefers paper tape   Wound Dressing Adhesive Rash   CBC:    Component Value Date/Time   WBC 8.2 01/30/2024 0532   HGB 10.8 (L) 01/30/2024 0532   HCT 36.2 01/30/2024 0532   PLT 178 01/30/2024 0532   MCV 98.9 01/30/2024 0532   NEUTROABS 11.1 (H) 01/13/2024 1755   LYMPHSABS 1.8 01/13/2024 1755   MONOABS 0.6 01/13/2024 1755   EOSABS 0.0 01/13/2024 1755   BASOSABS 0.0 01/13/2024 1755   Comprehensive Metabolic Panel:    Component Value Date/Time   NA 137 01/30/2024 0532   K 4.5 01/30/2024 0532   CL 105 01/30/2024 0532   CO2 24 01/30/2024 0532   BUN 15 01/30/2024 0532   CREATININE 0.43 (L) 01/30/2024 0532   GLUCOSE 141 (H) 01/30/2024 0532   CALCIUM  8.5 (L) 01/30/2024 0532   AST 16 01/10/2024 1744   ALT 16 01/10/2024 1744   ALKPHOS 53 01/10/2024 1744   BILITOT 0.4 01/10/2024 1744   PROT 6.1 (L) 01/10/2024 1744   ALBUMIN 3.3 (L) 01/10/2024 1744    Physical Exam: Vital Signs: BP 108/72 (BP Location: Left Arm)   Pulse 70   Temp 98.8 F (37.1 C)   Resp 20  Ht 5\' 3"  (1.6 m)   Wt 53.3 kg   SpO2 100%    BMI 20.82 kg/m  SpO2: SpO2: 100 % O2 Device: O2 Device: Nasal Cannula O2 Flow Rate: O2 Flow Rate (L/min): 2 L/min Intake/output summary: No intake or output data in the 24 hours ending 01/30/24 0805 LBM:   Baseline Weight: Weight: 53.3 kg Most recent weight: Weight: 53.3 kg  General: NAD, alert, chronically ill-appearing, frail Cardiovascular: RRR Respiratory: Slightly increased work of breathing noted, not in respiratory distress Neuro: A&Ox4, following commands easily Psych: Appropriately tearful at times during conversation          Palliative Performance Scale: 50%              Additional Data Reviewed: Recent Labs    01/29/24 1720 01/29/24 1825 01/30/24 0532  WBC 11.3*  --  8.2  HGB 9.5*  --  10.8*  PLT 165  --  178  NA  --  142 137  BUN  --  12 15  CREATININE  --  0.45 0.43*    Imaging: DG Chest 2 View CLINICAL DATA:  Cough and shortness of breath  EXAM: CHEST - 2 VIEW  COMPARISON:  Chest x-ray 01/26/2024  FINDINGS: Pleuroparenchymal opacities and volume loss in the left lung apex appear unchanged from prior. The lungs are otherwise clear. No pleural effusion or pneumothorax. The cardiomediastinal silhouette is within normal limits. Tracheal deviation to the left is unchanged. No acute fractures are seen.  IMPRESSION: 1. Stable chronic changes in the left lung apex. 2. No acute cardiopulmonary process.  Electronically Signed   By: Tyron Gallon M.D.   On: 01/29/2024 19:00    I personally reviewed recent imaging.   Palliative Care Assessment and Plan Summary of Established Goals of Care and Medical Treatment Preferences   Patient is a 68 year old female with a past medical history of chronic hypoxic respiratory failure on 2 L nasal cannula, lung cancer status post left lobectomy, COPD, tobacco use disorder, paroxysmal atrial fibrillation on Eliquis , CAD, hypertension, hyperlipidemia, hypothyroidism, depression, and anxiety who was admitted on  01/27/2024 for management of shortness of breath.  Patient is noted to have 16 hospital admissions for COPD exacerbations over the past year.  Patient currently receiving management for this again.  Palliative medicine team consulted to assist with complex medical decision making.  # Complex medical decision making/goals of care  - Extensive discussion with patient as detailed above in HPI.  Reviewed patient's medical journey and worsening lung status requiring 16 hospitalizations within the past year.  Patient lacking psychosocial support as well which is affecting patient's quality of life.  Based on prior information that is viewable in EMR, patient would be COPD Gold stage III.  Unsure if patient has further pulmonology medications/interventions for medical optimization available.  Introduced to patient that if she is not appropriate for further medical management from pulmonology perspective, would need to consider involving home hospice support to support quality of time and symptom burden at home.  Palliative medicine team to continue following along with patient's medical journey to engage in conversations as able and appropriate.  -  Code Status: Limited: Do not attempt resuscitation (DNR) -DNR-LIMITED -Do Not Intubate/DNI    # Symptom management Patient is receiving these palliative interventions for symptom management with an intent to improve quality of life.   - Shortness of breath, in setting of COPD; 16 hospitalizations in past year   - COPD management as per primary.     -  Start oxycodone  5 mg every 4 hours as needed to assist with shortness of breath symptom management.     - Start senna 1 tab twice daily to assist with bowel movements while taking opioids.  Patient normally has bowel movement daily.  # Psycho-social/Spiritual Support:  - Desire for further Chaplain support:yes  # Discharge Planning:  To Be Determined  Thank you for allowing the palliative care team to participate  in the care Tracy Park.  Barnett Libel, DO Palliative Care Provider PMT # [phone]  If patient remains symptomatic despite maximum doses, please call PMT at [phone] between 0700 and 1900. Outside of these hours, please call attending, as PMT does not have night coverage.  Personally spent 80 minutes in patient care including extensive chart review (labs, imaging, progress/consult notes, vital signs), medically appropraite exam, discussed with treatment team, education to patient, family, and staff, documenting clinical information, medication review and management, coordination of care, and available advanced directive documents.

## 2024-01-31 DIAGNOSIS — I10 Essential (primary) hypertension: Secondary | ICD-10-CM

## 2024-01-31 DIAGNOSIS — J9621 Acute and chronic respiratory failure with hypoxia: Secondary | ICD-10-CM | POA: Diagnosis not present

## 2024-01-31 DIAGNOSIS — F6811 Factitious disorder with predominantly psychological signs and symptoms: Secondary | ICD-10-CM | POA: Diagnosis not present

## 2024-01-31 DIAGNOSIS — F32A Depression, unspecified: Secondary | ICD-10-CM

## 2024-01-31 DIAGNOSIS — Z7189 Other specified counseling: Secondary | ICD-10-CM | POA: Diagnosis not present

## 2024-01-31 DIAGNOSIS — Z515 Encounter for palliative care: Secondary | ICD-10-CM | POA: Diagnosis not present

## 2024-01-31 DIAGNOSIS — J441 Chronic obstructive pulmonary disease with (acute) exacerbation: Secondary | ICD-10-CM | POA: Diagnosis not present

## 2024-01-31 DIAGNOSIS — F419 Anxiety disorder, unspecified: Secondary | ICD-10-CM

## 2024-01-31 DIAGNOSIS — R4589 Other symptoms and signs involving emotional state: Secondary | ICD-10-CM | POA: Diagnosis not present

## 2024-01-31 MED ORDER — OXYCODONE HCL 5 MG PO TABS: 5.0000 mg | ORAL_TABLET | Freq: Four times a day (QID) | ORAL | 0 refills | Status: DC | PRN

## 2024-01-31 MED ORDER — PREDNISONE 20 MG PO TABS
ORAL_TABLET | ORAL | 0 refills | Status: DC
Start: 2024-02-01 — End: 2024-02-09

## 2024-01-31 NOTE — Progress Notes (Signed)
 [facility] Liaison Note  Received Epic chat from Dr. Azalea Lento requesting hospice services at home. Per Skipper Dumas, LCSW patient has already discharged to home.  Referral submitted for hospice services to our referral intake department who will reach out to this patient to schedule an admission visit.  Please call with any questions or concerns.  Thank you for the opportunity to participate in this patients care.  Lestine Rathke, BSN, [employer] [phone]

## 2024-01-31 NOTE — Progress Notes (Signed)
 Mobility Specialist - Progress Note   01/31/24 1129  Oxygen  Therapy  SpO2 92 %  O2 Device Nasal Cannula  O2 Flow Rate (L/min) 2 L/min  Patient Activity (if Appropriate) Ambulating  Mobility  Activity Ambulated independently in hallway  Level of Assistance Independent  Assistive Device None  Distance Ambulated (ft) 80 ft  Activity Response Tolerated well  Mobility Referral Yes  Mobility visit 1 Mobility  Mobility Specialist Start Time (ACUTE ONLY) 1116  Mobility Specialist Stop Time (ACUTE ONLY) 1124  Mobility Specialist Time Calculation (min) (ACUTE ONLY) 8 min   Pt received in bed and agreeable to mobility. Pt took x1 seated rest break d/t dyspnea. Non productive coughing throughout session. Pt to EOB after session with all needs met.    Pre-mobility: 98% SpO2 (2L [place]) During mobility: 110 HR, 92% SpO2 (2L [place]) Post-mobility: 96% SPO2 (2L )  Chief Technology Officer

## 2024-01-31 NOTE — Care Management Obs Status (Signed)
 MEDICARE OBSERVATION STATUS NOTIFICATION   Patient Details  Name: Tracy Park MRN: [id number] Date of Birth: 10/30/1955   Medicare Observation Status Notification Given:  Yes    Taryll Reichenberger A Callahan Wild, LCSW 01/31/2024, 9:55 AM

## 2024-01-31 NOTE — Progress Notes (Signed)
 PROGRESS NOTE    Tracy Park  ZOX:[id number] DOB: 08-Aug-1956 DOA: 01/29/2024 PCP: Chana Comas, FNP  Subjective: Pt seen and examined. Pt seen by palliative care. Started on some oxycodone  for air hunger. Remains on baseline O2 @ 2 L/min. On po prednisone .   Hospital Course: HPI: Tracy Park is a 68 y.o. female with medical history significant of chronic hypoxic respiratory failure on 2L [place], COPD, lung cancer s/p left lobectomy, paroxsymal atrial fibrillation on eliquis , CAD, HTN, HLD, hypothyroidism, depression and anxiety presenting with increasing SHOB.    Patient with 16 hospital admissions for COPD exacerbation over the past year, most recent hospitalization earlier this month. She is chronically on 2L [place] at home. She reports increasing SHOB and wheezing for the past 2 days, which prompted her to come to the ED for further evaluation. She has been adherent with her home bronchodilators. Has not required to increase her home oxygen . She denies any fevers, chills, nausea, vomiting, chest pain, palpitations, change in cough, abdominal pain, urinary changes.    Patient given magnesium  sulfate, albuterol  nebs, and methylprednisolone  by EMS en route to ED.   ED course:  Vital signs notable for tachycardia (has since resolved), mild tachypnea. CBC with mild leukocytosis, hgb 9.5 (down from baseline around 11-12). BMP with K 3.0, glucose 164, normal kidney function. Respiratory panel negative for COVID, flu, RSV. Troponin normal at 6. CXR unremarkable. Given duonebs and one dose of PO K 40mEq in ED. Upon walking patient, patient found to have conversational dyspnea despite adequate oxygenation on home 2L [place]. Given history of frequent COPD exacerbations, persistence of wheezing, and conversational dyspnea, [facility] asked to evaluate patient for admission.  Significant Events: Admitted 01/29/2024 for COPD exacerbation   Admission Labs: WBC 11.3, HgB 9.5, plt 165 Na 142, K 3.0, CO2 of 29, BUN  12, Scr 0.45, glu 164 Covid/flu/rsv negative RVP negative  Admission Imaging Studies: CXR Stable chronic changes in the left lung apex. 2. No acute cardiopulmonary process  Significant Labs:   Significant Imaging Studies:   Antibiotic Therapy: Anti-infectives (From admission, onward)    None       Procedures:   Consultants: Palliative care    Assessment and Plan: * COPD with acute exacerbation 01-30-2024 on prednisone  40 mg daily. Verified with pt that she has home O2 machine. Uses mask instead of  at home. Has home nebulizer machine. Pt questions why she keeps getting admitted for COPD exacerbations without acknowledging that her continued abuse of cigarettes may be playing an active role in her repeated hospitalization. Pt takes no responsibility for her actions.  Furthermore, pt claimed she only "walked a few feet" today with mobility tech. I personally verified with mobility tech that pt walked 460 feet today!.  Have asked palliative care team to see pt in consult to discuss her GOC since pt has been admitted 17 times in the last 12 months for repeated COPD exacerbations.  01-31-2024 on 2 L/min O2. On PO prednisone . Pt seen by palliative care. On prn oxycodone  for air hunger. I don't think it's appropriate for new pulmonary consult to determine if pt has end-stage COPD during acute exacerbation. Pt has an already well established pulmonologist that she had been seeing. She will need to f/u with Dr. Javaid with [facility] in [place]. Pt requesting new pulmonlogist with . I can put in referral but pt needs to f/u with her established pulmonary provider at [facility].  I think the biggest issue is pt's non-compliance with tobacco  cessation. Pt continues to smoke despite her severe lung disease, chronic hypoxia on supplemental O2 and prior lobectomy for cancer. She takes absolutely no ownership in her behavior as the cause of her frequent exacerbations. Instead she focuses on  blaming the healthcare system and provider on her frequent hospitalizations.  Clearly, when she is not allowed to smoke, her lung function improves within only a few days. Pt is stable for DC.  Factitious disorder with predominantly psychological signs and symptoms 02-20-2024 pt claming that she is feeling "too weak" and "unable to walk".  Claims she "only walked a few feet" today.  Verified with mobility tech that pt walked 460 feet today. Pt stating may feel too weak to go home.  Also states she doesn't know why her COPD is always getting bad despite her continuing to smoke cigarettes.  Pt verifies she knows that she has COPD, had had lung cancer requiring left upper lobectomy. She states she knows that smoking is bad for her and yet is surprised when her COPD flares when she smokes.  01-31-2024 pt walked 80 feet prior to DC today with mobility specialist. This is longer than any distance she needs to walk in her small home.  Tobacco use 20-Feb-2024 continues to smoke cigarettes despite chronic respiratory failure on home O2, prior lung cancer requiring lobectomy and 17 hospitalizations in the last 12 months for COPD exacerbation. Pt states that nicotine  patches don't work for her. Pt REFUSES to take Chantix because "of what it did to my husband".  Pt herself has never tried Chantix. Refuses any pharmaceutical medications to help quit smoking. Pt states she "only smokes 3 cigarettes a day".  01-31-2024 pt refuses to use/try/attempt any form of tobacco cessation aids.  I think the biggest issue is pt's non-compliance with tobacco cessation. Pt continues to smoke despite her severe lung disease, chronic hypoxia on supplemental O2 and prior lobectomy for cancer. She takes absolutely no ownership in her behavior as the cause of her frequent exacerbations. Instead she focuses on blaming the healthcare system and provider on her frequent hospitalizations. Clearly, when she is not allowed to smoke, her lung  function improves within only a few days. Pt is stable for DC.   Acute on chronic respiratory failure with hypoxia (HCC) 02-20-24 back down to 2 L/min [place]. Chronically on 2 L/min.  01-31-2024 after she is not allowed to smoke in hospital, pt quickly weaned back to 2 L/min.  Chronic respiratory failure with hypoxia (HCC) 2024/02/20 chronically on 2 L/min continuously.  01-31-2024 stable  Paroxysmal atrial fibrillation (HCC) 2024/02/20 stable on eliquis  and Toprol -XL 12.5 mg daily.  01-31-2024 stable  Essential hypertension February 20, 2024 stable. On Toprol -XL 12.5 mg daily.  01-31-2024 stable  Anxiety and depression 02/20/2024 pt states she husband died 2 years ago. Lives with her sister in [place].  01-31-2024 chronic. Pt needs to find an mental health professional to help her deal with her grief.   DVT prophylaxis: apixaban  (ELIQUIS ) tablet 5 mg Start: 01/29/24 2200 apixaban  (ELIQUIS ) tablet 5 mg     Code Status: Limited: Do not attempt resuscitation (DNR) -DNR-LIMITED -Do Not Intubate/DNI  Family Communication: pt is decisional. Disposition Plan: return home Reason for continuing need for hospitalization: stable for DC  Objective: Vitals:   01/31/24 0428 01/31/24 0802 01/31/24 0804 01/31/24 1129  BP: 121/71     Pulse: 65     Resp: 15     Temp: 98.7 F (37.1 C)     TempSrc:  SpO2: 100% 100% 99% 92%  Weight:      Height:        Intake/Output Summary (Last 24 hours) at 01/31/2024 1136 Last data filed at 01/30/2024 1400 Gross per 24 hour  Intake 360 ml  Output --  Net 360 ml   Filed Weights   01/29/24 1817  Weight: 53.3 kg    Examination:  Physical Exam Vitals and nursing note reviewed.  Constitutional:      General: She is not in acute distress.    Appearance: She is not toxic-appearing.  HENT:     Head: Normocephalic and atraumatic.     Nose: Nose normal.  Cardiovascular:     Rate and Rhythm: Normal rate and regular rhythm.  Pulmonary:     Effort:  No respiratory distress.  Abdominal:     General: Abdomen is flat. Bowel sounds are normal.  Skin:    Capillary Refill: Capillary refill takes less than 2 seconds.  Neurological:     General: No focal deficit present.     Mental Status: She is alert and oriented to person, place, and time.     Data Reviewed: I have personally reviewed following labs and imaging studies  CBC: Recent Labs  Lab 01/26/24 1542 01/29/24 1720 01/30/24 0532  WBC 15.9* 11.3* 8.2  HGB 12.4 9.5* 10.8*  HCT 39.6 31.4* 36.2  MCV 93.8 97.2 98.9  PLT 249 165 178   Basic Metabolic Panel: Recent Labs  Lab 01/26/24 1542 01/29/24 1825 01/30/24 0532  NA 140 142 137  K 4.1 3.0* 4.5  CL 100 106 105  CO2 29 29 24   GLUCOSE 171* 164* 141*  BUN 17 12 15   CREATININE 0.43* 0.45 0.43*  CALCIUM  9.2 8.4* 8.5*   GFR: Estimated Creatinine Clearance: 58 mL/min (A) (by C-G formula based on SCr of 0.43 mg/dL (L)). BNP (last 3 results) Recent Labs    12/28/23 1728 01/02/24 1300 01/15/24 0448  BNP 76.4 124.9* 95.3   HbA1C: Recent Labs    01/29/24 1728  HGBA1C 5.5   CBG: Recent Labs  Lab 01/29/24 2149 01/29/24 2231 01/30/24 0741 01/30/24 1139  GLUCAP 187* 165* 99 108*    Recent Results (from the past 240 hours)  Resp panel by RT-PCR (RSV, Flu A&B, Covid) Anterior Nasal Swab     Status: None   Collection Time: 01/26/24  4:35 PM   Specimen: Anterior Nasal Swab  Result Value Ref Range Status   SARS Coronavirus 2 by RT PCR NEGATIVE NEGATIVE Final    Comment: (NOTE) SARS-CoV-2 target nucleic acids are NOT DETECTED.  The SARS-CoV-2 RNA is generally detectable in upper respiratory specimens during the acute phase of infection. The lowest concentration of SARS-CoV-2 viral copies this assay can detect is 138 copies/mL. A negative result does not preclude SARS-Cov-2 infection and should not be used as the sole basis for treatment or other patient management decisions. A negative result may occur with   improper specimen collection/handling, submission of specimen other than nasopharyngeal swab, presence of viral mutation(s) within the areas targeted by this assay, and inadequate number of viral copies(<138 copies/mL). A negative result must be combined with clinical observations, patient history, and epidemiological information. The expected result is Negative.  Fact Sheet for Patients:  BloggerCourse.com  Fact Sheet for Healthcare Providers:  SeriousBroker.it  This test is no t yet approved or cleared by the [place]  FDA and  has been authorized for detection and/or diagnosis of SARS-CoV-2 by FDA under an Emergency Use  Authorization (EUA). This EUA will remain  in effect (meaning this test can be used) for the duration of the COVID-19 declaration under Section 564(b)(1) of the Act, 21 U.S.C.section 360bbb-3(b)(1), unless the authorization is terminated  or revoked sooner.       Influenza A by PCR NEGATIVE NEGATIVE Final   Influenza B by PCR NEGATIVE NEGATIVE Final    Comment: (NOTE) The Xpert Xpress SARS-CoV-2/FLU/RSV plus assay is intended as an aid in the diagnosis of influenza from Nasopharyngeal swab specimens and should not be used as a sole basis for treatment. Nasal washings and aspirates are unacceptable for Xpert Xpress SARS-CoV-2/FLU/RSV testing.  Fact Sheet for Patients: BloggerCourse.com  Fact Sheet for Healthcare Providers: SeriousBroker.it  This test is not yet approved or cleared by the [place]  FDA and has been authorized for detection and/or diagnosis of SARS-CoV-2 by FDA under an Emergency Use Authorization (EUA). This EUA will remain in effect (meaning this test can be used) for the duration of the COVID-19 declaration under Section 564(b)(1) of the Act, 21 U.S.C. section 360bbb-3(b)(1), unless the authorization is terminated or revoked.      Resp Syncytial Virus by PCR NEGATIVE NEGATIVE Final    Comment: (NOTE) Fact Sheet for Patients: BloggerCourse.com  Fact Sheet for Healthcare Providers: SeriousBroker.it  This test is not yet approved or cleared by the [place]  FDA and has been authorized for detection and/or diagnosis of SARS-CoV-2 by FDA under an Emergency Use Authorization (EUA). This EUA will remain in effect (meaning this test can be used) for the duration of the COVID-19 declaration under Section 564(b)(1) of the Act, 21 U.S.C. section 360bbb-3(b)(1), unless the authorization is terminated or revoked.  Performed at [facility], [address], [place], [place] [address]   Resp panel by RT-PCR (RSV, Flu A&B, Covid) Anterior Nasal Swab     Status: None   Collection Time: 01/29/24  7:24 PM   Specimen: Anterior Nasal Swab  Result Value Ref Range Status   SARS Coronavirus 2 by RT PCR NEGATIVE NEGATIVE Final    Comment: (NOTE) SARS-CoV-2 target nucleic acids are NOT DETECTED.  The SARS-CoV-2 RNA is generally detectable in upper respiratory specimens during the acute phase of infection. The lowest concentration of SARS-CoV-2 viral copies this assay can detect is 138 copies/mL. A negative result does not preclude SARS-Cov-2 infection and should not be used as the sole basis for treatment or other patient management decisions. A negative result may occur with  improper specimen collection/handling, submission of specimen other than nasopharyngeal swab, presence of viral mutation(s) within the areas targeted by this assay, and inadequate number of viral copies(<138 copies/mL). A negative result must be combined with clinical observations, patient history, and epidemiological information. The expected result is Negative.  Fact Sheet for Patients:  BloggerCourse.com  Fact Sheet for Healthcare Providers:   SeriousBroker.it  This test is no t yet approved or cleared by the [place]  FDA and  has been authorized for detection and/or diagnosis of SARS-CoV-2 by FDA under an Emergency Use Authorization (EUA). This EUA will remain  in effect (meaning this test can be used) for the duration of the COVID-19 declaration under Section 564(b)(1) of the Act, 21 U.S.C.section 360bbb-3(b)(1), unless the authorization is terminated  or revoked sooner.       Influenza A by PCR NEGATIVE NEGATIVE Final   Influenza B by PCR NEGATIVE NEGATIVE Final    Comment: (NOTE) The Xpert Xpress SARS-CoV-2/FLU/RSV plus assay is intended as an aid in  the diagnosis of influenza from Nasopharyngeal swab specimens and should not be used as a sole basis for treatment. Nasal washings and aspirates are unacceptable for Xpert Xpress SARS-CoV-2/FLU/RSV testing.  Fact Sheet for Patients: BloggerCourse.com  Fact Sheet for Healthcare Providers: SeriousBroker.it  This test is not yet approved or cleared by the [place]  FDA and has been authorized for detection and/or diagnosis of SARS-CoV-2 by FDA under an Emergency Use Authorization (EUA). This EUA will remain in effect (meaning this test can be used) for the duration of the COVID-19 declaration under Section 564(b)(1) of the Act, 21 U.S.C. section 360bbb-3(b)(1), unless the authorization is terminated or revoked.     Resp Syncytial Virus by PCR NEGATIVE NEGATIVE Final    Comment: (NOTE) Fact Sheet for Patients: BloggerCourse.com  Fact Sheet for Healthcare Providers: SeriousBroker.it  This test is not yet approved or cleared by the [place]  FDA and has been authorized for detection and/or diagnosis of SARS-CoV-2 by FDA under an Emergency Use Authorization (EUA). This EUA will remain in effect (meaning this test can be used) for  the duration of the COVID-19 declaration under Section 564(b)(1) of the Act, 21 U.S.C. section 360bbb-3(b)(1), unless the authorization is terminated or revoked.  Performed at [facility], [address] [place], [place] [address]   Respiratory (~20 pathogens) panel by PCR     Status: None   Collection Time: 01/29/24 11:13 PM   Specimen: Nasopharyngeal Swab; Respiratory  Result Value Ref Range Status   Adenovirus NOT DETECTED NOT DETECTED Final   Coronavirus 229E NOT DETECTED NOT DETECTED Final    Comment: (NOTE) The Coronavirus on the Respiratory Panel, DOES NOT test for the novel  Coronavirus (2019 nCoV)    Coronavirus HKU1 NOT DETECTED NOT DETECTED Final   Coronavirus NL63 NOT DETECTED NOT DETECTED Final   Coronavirus OC43 NOT DETECTED NOT DETECTED Final   Metapneumovirus NOT DETECTED NOT DETECTED Final   Rhinovirus / Enterovirus NOT DETECTED NOT DETECTED Final   Influenza A NOT DETECTED NOT DETECTED Final   Influenza B NOT DETECTED NOT DETECTED Final   Parainfluenza Virus 1 NOT DETECTED NOT DETECTED Final   Parainfluenza Virus 2 NOT DETECTED NOT DETECTED Final   Parainfluenza Virus 3 NOT DETECTED NOT DETECTED Final   Parainfluenza Virus 4 NOT DETECTED NOT DETECTED Final   Respiratory Syncytial Virus NOT DETECTED NOT DETECTED Final   Bordetella pertussis NOT DETECTED NOT DETECTED Final   Bordetella Parapertussis NOT DETECTED NOT DETECTED Final   Chlamydophila pneumoniae NOT DETECTED NOT DETECTED Final   Mycoplasma pneumoniae NOT DETECTED NOT DETECTED Final    Comment: Performed at [facility], [address], [place], [place] [address]     Radiology Studies: DG Chest 2 View Result Date: 01/29/2024 CLINICAL DATA:  Cough and shortness of breath EXAM: CHEST - 2 VIEW COMPARISON:  Chest x-ray 01/26/2024 FINDINGS: Pleuroparenchymal opacities and volume loss in the left lung apex appear unchanged from prior. The lungs are otherwise clear. No pleural  effusion or pneumothorax. The cardiomediastinal silhouette is within normal limits. Tracheal deviation to the left is unchanged. No acute fractures are seen. IMPRESSION: 1. Stable chronic changes in the left lung apex. 2. No acute cardiopulmonary process. Electronically Signed   By: Tyron Gallon M.D.   On: 01/29/2024 19:00    Scheduled Meds:  apixaban   5 mg Oral BID   budesonide -glycopyrrolate -formoterol   2 puff Inhalation BID   ipratropium-albuterol   3 mL Nebulization QID   levothyroxine   125 mcg  Oral Q0600   metoprolol  succinate  12.5 mg Oral Daily   polyvinyl alcohol   1 drop Both Eyes QID   prednisoLONE  acetate  1 drop Left Eye QID   predniSONE   40 mg Oral Q breakfast   rosuvastatin   20 mg Oral Daily   senna  1 tablet Oral BID   sertraline   25 mg Oral Daily   Continuous Infusions:   LOS: 0 days   Time spent: 50 minutes  Unk Garb, DO  [facility]  01/31/2024, 11:36 AM

## 2024-01-31 NOTE — Progress Notes (Signed)
 Daily Progress Note   Patient Name: Tracy Park       Date: 01/31/2024 DOB: 1955-10-21  Age: 68 y.o. MRN#: [id number] Attending Physician: Unk Garb, DO Primary Care Physician: Chana Comas, FNP Admit Date: 01/29/2024 Length of Stay: 0 days  Reason for Consultation/Follow-up: Establishing goals of care  Subjective:   CC: Patient sitting on edge of bed anxious and fearful about being readmitted to the hospital.  Palliative medicine team following up regarding complex medical decision making.  Subjective:  At time of EMR review in past 24 hours patient has received as needed oxycodone  5 mg x 3 doses to assist with shortness of breath management.  Was noted that patient had discharge orders and this morning.  This provider presented to bedside as soon as able.  RN providing patient with taxi slip so patient can be transferred home since she does not have a ride.  Patient sitting on edge of bed.  Able to follow-up with patient regarding planning.  Patient noted that she would be appreciated [facility] hospice support at home.  Patient's husband had this and she noted benefits.  Discussed that since patient already has discharge orders in, can reach out to [facility] and a hospice liaison to help with coordination so that patient could be contacted at home for further hospitalist establishment.  Patient agreeing with this plan.  Patient requesting [facility] hospice support.  Noted would reach out to this liaison. Patient has received oxycodone  to assist with shortness of breath management.  Likely need to be on long-acting opioid to assist with management; appreciate hospice's assistance in home setting. Spent time providing emotional support as patient anxious and fearful about being readmitted to the hospital again.  Discussed should patient return to the hospital, please request palliative medicine consult to assist with symptom management. All questions answered at that time.  Hoped patient could enjoy  some quality time at home.  Patient voiced appreciation for assistance.  Objective:   Vital Signs:  BP 121/71 (BP Location: Right Arm)   Pulse 65   Temp 98.7 F (37.1 C)   Resp 15   Ht 5\' 3"  (1.6 m)   Wt 53.3 kg   SpO2 100%   BMI 20.82 kg/m   Physical Exam: General: NAD, alert, chronically ill-appearing, frail Cardiovascular: RRR Respiratory: Slightly increased work of breathing noted, not in respiratory distress Neuro: A&Ox4, following commands easily Psych: Anxious, fearful  Assessment & Plan:   Assessment: Patient is a 68 year old female with a past medical history of chronic hypoxic respiratory failure on 2 L nasal cannula, lung cancer status post left lobectomy, COPD, tobacco use disorder, paroxysmal atrial fibrillation on Eliquis , CAD, hypertension, hyperlipidemia, hypothyroidism, depression, and anxiety who was admitted on 01/27/2024 for management of shortness of breath. Patient is noted to have 16 hospital admissions for COPD exacerbations over the past year. Patient currently receiving management for this again. Palliative medicine team consulted to assist with complex medical decision making.   Recommendations/Plan: # Complex medical decision making/goals of care:   -Discharge orders for patient were put in this morning prior to palliative medicine team seeing patient.  Was able to present to bedside to discuss care with patient as detailed above in HPI.  Patient has had worsening lung status requiring 16 hospitalizations within the past year.  Patient lacking psychosocial support as well which is affecting patient's quality of life.  Patient agreeing with home hospice evaluation to support her end-stage lung disease at home.  Patient requesting evaluation by  [facility].  Unfortunately patient was discharged prior to being able to discuss this though this provider was able to reach out to [facility] liaison and TOC so patient will receive further information regarding home hospice  support.                -  Code Status: Limited: Do not attempt resuscitation (DNR) -DNR-LIMITED -Do Not Intubate/DNI     # Symptom management Patient is receiving these palliative interventions for symptom management with an intent to improve quality of life.                 - Shortness of breath, in setting of COPD; 16 hospitalizations in past year                               - COPD management as per primary.                                                     - Continue oxycodone  5 mg every 4 hours as needed to assist with shortness of breath symptom management.                                               - Continue senna 1 tab twice daily to assist with bowel movements while taking opioids.  Patient normally has bowel movement daily.   # Psycho-social/Spiritual Support:  - Desire for further Chaplain support:yes  # Discharge Planning: Patient discharged home this morning.  Patient agreeing with hospice support though discharge order placed prior to this being arranged.  This provider was able to reach out to [facility] liaison who will follow-up with patient about home hospice support.  Discussed with: Patient, TOC, [facility] liaison  Thank you for allowing the palliative care team to participate in the care Tracy Park.  Barnett Libel, DO Palliative Care Provider PMT # [phone]  If patient remains symptomatic despite maximum doses, please call PMT at [phone] between 0700 and 1900. Outside of these hours, please call attending, as PMT does not have night coverage.  Personally spent 35 minutes in patient care including extensive chart review (labs, imaging, progress/consult notes, vital signs), medically appropraite exam, discussed with treatment team, education to patient, family, and staff, documenting clinical information, medication review and management, coordination of care, and available advanced directive documents.

## 2024-01-31 NOTE — Discharge Summary (Signed)
 [facility]  Hospitalist Physician Discharge Summary   Patient name: Tracy Park  Admit date:     01/29/2024  Discharge date: 01/31/2024  Attending Physician: Mandy Second [[id number]]  Discharge Physician: Unk Garb   PCP: Chana Comas, FNP  Admitted From: Home  Disposition:  Home  Recommendations for Outpatient Follow-up:  Follow up with PCP in 1-2 weeks Follow up with [facility] in 2 weeks  Home Health:No Equipment/Devices: None  Pt already has home O2 and portable O2  Discharge Condition:Stable CODE STATUS:DNR/DNI Diet recommendation: Regular Fluid Restriction: None  Hospital Summary: HPI: Tracy Park is a 68 y.o. female with medical history significant of chronic hypoxic respiratory failure on 2L [place], COPD, lung cancer s/p left lobectomy, paroxsymal atrial fibrillation on eliquis , CAD, HTN, HLD, hypothyroidism, depression and anxiety presenting with increasing SHOB.    Patient with 16 hospital admissions for COPD exacerbation over the past year, most recent hospitalization earlier this month. She is chronically on 2L [place] at home. She reports increasing SHOB and wheezing for the past 2 days, which prompted her to come to the ED for further evaluation. She has been adherent with her home bronchodilators. Has not required to increase her home oxygen . She denies any fevers, chills, nausea, vomiting, chest pain, palpitations, change in cough, abdominal pain, urinary changes.    Patient given magnesium  sulfate, albuterol  nebs, and methylprednisolone  by EMS en route to ED.   ED course:  Vital signs notable for tachycardia (has since resolved), mild tachypnea. CBC with mild leukocytosis, hgb 9.5 (down from baseline around 11-12). BMP with K 3.0, glucose 164, normal kidney function. Respiratory panel negative for COVID, flu, RSV. Troponin normal at 6. CXR unremarkable. Given duonebs and one dose of PO K 40mEq in ED. Upon walking patient, patient found to have conversational  dyspnea despite adequate oxygenation on home 2L [place]. Given history of frequent COPD exacerbations, persistence of wheezing, and conversational dyspnea, TR[facility] asked to evaluate patient for admission.  Significant Events: Admitted 01/29/2024 for COPD exacerbation   Admission Labs: WBC 11.3, HgB 9.5, plt 165 Na 142, K 3.0, CO2 of 29, BUN 12, Scr 0.45, glu 164 Covid/flu/rsv negative RVP negative  Admission Imaging Studies: CXR Stable chronic changes in the left lung apex. 2. No acute cardiopulmonary process  Significant Labs:   Significant Imaging Studies:   Antibiotic Therapy: Anti-infectives (From admission, onward)    None       Procedures:   Consultants: Palliative care   Hospital Course by Problem: * COPD with acute exacerbation 01-30-2024 on prednisone  40 mg daily. Verified with pt that she has home O2 machine. Uses mask instead of [place] at home. Has home nebulizer machine. Pt questions why she keeps getting admitted for COPD exacerbations without acknowledging that her continued abuse of cigarettes may be playing an active role in her repeated hospitalization. Pt takes no responsibility for her actions.  Furthermore, pt claimed she only "walked a few feet" today with mobility tech. I personally verified with mobility tech that pt walked 460 feet today!.  Have asked palliative care team to see pt in consult to discuss her GOC since pt has been admitted 17 times in the last 12 months for repeated COPD exacerbations.  01-31-2024 on 2 L/min O2. On PO prednisone . Pt seen by palliative care. On prn oxycodone  for air hunger. I don't think it's appropriate for new pulmonary consult to determine if pt has end-stage COPD during acute exacerbation. Pt has an already well established pulmonologist that she  had been seeing. She will need to f/u with Dr. Javaid with [facility] in [place]. Pt requesting new pulmonlogist with [facility]. I can put in referral but pt needs to f/u with her  established pulmonary provider at [facility].  I think the biggest issue is pt's non-compliance with tobacco cessation. Pt continues to smoke despite her severe lung disease, chronic hypoxia on supplemental O2 and prior lobectomy for cancer. She takes absolutely no ownership in her behavior as the cause of her frequent exacerbations. Instead she focuses on blaming the healthcare system and provider on her frequent hospitalizations.  Clearly, when she is not allowed to smoke, her lung function improves within only a few days. Pt is stable for DC.  Factitious disorder with predominantly psychological signs and symptoms 2024/02/19 pt claming that she is feeling "too weak" and "unable to walk".  Claims she "only walked a few feet" today.  Verified with mobility tech that pt walked 460 feet today. Pt stating may feel too weak to go home.  Also states she doesn't know why her COPD is always getting bad despite her continuing to smoke cigarettes.  Pt verifies she knows that she has COPD, had had lung cancer requiring left upper lobectomy. She states she knows that smoking is bad for her and yet is surprised when her COPD flares when she smokes.  01-31-2024 pt walked 80 feet prior to DC today with mobility specialist. This is longer than any distance she needs to walk in her small home.  Tobacco use 2024-02-19 continues to smoke cigarettes despite chronic respiratory failure on home O2, prior lung cancer requiring lobectomy and 17 hospitalizations in the last 12 months for COPD exacerbation. Pt states that nicotine  patches don't work for her. Pt REFUSES to take Chantix because "of what it did to my husband".  Pt herself has never tried Chantix. Refuses any pharmaceutical medications to help quit smoking. Pt states she "only smokes 3 cigarettes a day".  01-31-2024 pt refuses to use/try/attempt any form of tobacco cessation aids.  I think the biggest issue is pt's non-compliance with tobacco cessation. Pt continues to  smoke despite her severe lung disease, chronic hypoxia on supplemental O2 and prior lobectomy for cancer. She takes absolutely no ownership in her behavior as the cause of her frequent exacerbations. Instead she focuses on blaming the healthcare system and provider on her frequent hospitalizations. Clearly, when she is not allowed to smoke, her lung function improves within only a few days. Pt is stable for DC.   Acute on chronic respiratory failure with hypoxia (HCC) 02/19/2024 back down to 2 L/min [place]. Chronically on 2 L/min.  01-31-2024 after she is not allowed to smoke in hospital, pt quickly weaned back to 2 L/min.  Chronic respiratory failure with hypoxia (HCC) 02-19-24 chronically on 2 L/min continuously.  01-31-2024 stable  Paroxysmal atrial fibrillation (HCC) 02-19-24 stable on eliquis  and Toprol -XL 12.5 mg daily.  01-31-2024 stable  Essential hypertension 02-19-2024 stable. On Toprol -XL 12.5 mg daily.  01-31-2024 stable  Anxiety and depression 02/19/2024 pt states she husband died 2 years ago. Lives with her sister in [place].  01-31-2024 chronic. Pt needs to find an mental health professional to help her deal with her grief.    Discharge Diagnoses:  Principal Problem:   COPD with acute exacerbation Active Problems:   Tobacco use   Factitious disorder with predominantly psychological signs and symptoms   Acute on chronic respiratory failure with hypoxia (HCC)   Anxiety and depression   Essential hypertension  Paroxysmal atrial fibrillation (HCC)   Chronic respiratory failure with hypoxia [facility])   Palliative care encounter   Need for emotional support   Counseling and coordination of care   Goals of care, counseling/discussion   COPD exacerbation [facility])   Discharge Instructions  Discharge Instructions     Diet - low sodium heart healthy   Complete by: As directed    Discharge instructions   Complete by: As directed    1. New primary care provider referral  has been made for you.  Until you are seen by a new [facility] primary care provider, you need to continue to use [facility] for you health needs.  2. Ambulatory referral made to [facility]. Until you are established with [facility], you need to continue to see [facility] in [place]  3. Absolutely no tobacco use at all. This includes cigarettes, cigars or e-cigarettes/vapes.   Increase activity slowly   Complete by: As directed    Pulmonary Visit   Complete by: As directed    COPD. Recurrent hospitalizations.   Reason for referral: Other Pulmonary      Allergies as of 01/31/2024       Reactions   Red Dye #40 (allura Red) Hives, Itching, Other (See Comments)   Red food dye   Strawberry Extract Hives, Itching   Tomato Hives, Itching   Aspirin Hives   Tape Rash, Other (See Comments)   Prefers paper tape   Wound Dressing Adhesive Rash        Medication List     STOP taking these medications    gatifloxacin  0.5 % Soln Commonly known as: ZYMAXID    oxyCODONE -acetaminophen  5-325 MG tablet Commonly known as: PERCOCET/ROXICET       TAKE these medications    albuterol  (2.5 MG/3ML) 0.083% nebulizer solution Commonly known as: PROVENTIL  Take 2.5 mg by nebulization every 4 (four) hours as needed for wheezing or shortness of breath.   albuterol  108 (90 Base) MCG/ACT inhaler Commonly known as: VENTOLIN  HFA Inhale 2 puffs into the lungs every 6 (six) hours as needed for wheezing or shortness of breath.   Breztri  Aerosphere 160-9-4.8 MCG/ACT Aero inhaler Generic drug: budesonide -glycopyrrolate -formoterol  Inhale 1 puff into the lungs in the morning and at bedtime. What changed: how much to take   Eliquis  5 MG Tabs tablet Generic drug: apixaban  Take 1 tablet (5 mg total) by mouth 2 (two) times daily.   levothyroxine  125 MCG tablet Commonly known as: SYNTHROID  Take 125 mcg by mouth daily before breakfast.   magnesium  oxide 400  (240 Mg) MG tablet Commonly known as: MAG-OX Take 400 mg by mouth in the morning.   metoprolol  succinate 25 MG 24 hr tablet Commonly known as: TOPROL -XL Take 12.5 mg by mouth daily.   nitroGLYCERIN  0.4 MG SL tablet Commonly known as: NITROSTAT  Place 0.4 mg under the tongue every 5 (five) minutes as needed for chest pain.   oxyCODONE  5 MG immediate release tablet Commonly known as: Oxy IR/ROXICODONE  Take 1 tablet (5 mg total) by mouth every 6 (six) hours as needed (shortness of breath, air hunger).   OXYGEN  Inhale 2 L/min into the lungs continuous.   polyvinyl alcohol  1.4 % ophthalmic solution Commonly known as: LIQUIFILM TEARS Place 1 drop into both eyes in the morning, at noon, in the evening, and at bedtime.   prednisoLONE  acetate 1 % ophthalmic suspension Commonly known as: PRED FORTE  Place 1 drop into the left eye 4 (four) times daily.   predniSONE  20  MG tablet Commonly known as: DELTASONE  Take 2 tablets (40 mg total) by mouth daily with breakfast for 3 days, THEN 1.5 tablets (30 mg total) daily with breakfast for 3 days, THEN 1 tablet (20 mg total) daily with breakfast for 3 days, THEN 0.5 tablets (10 mg total) daily with breakfast for 3 days. Start taking on: Feb 01, 2024 What changed: See the new instructions.   rosuvastatin  20 MG tablet Commonly known as: CRESTOR  Take 20 mg by mouth daily.   sertraline  25 MG tablet Commonly known as: ZOLOFT  Take 25 mg by mouth in the morning.   sodium chloride  HYPERTONIC 3 % nebulizer solution Take 4 mLs by nebulization 3 (three) times daily for 15 days. Vials are single use vials. What changed: when to take this   Systane Complete PF 0.6 % Soln Generic drug: Propylene Glycol (PF) Place 1 drop into both eyes 4 (four) times daily as needed.        Allergies  Allergen Reactions   Red Dye #40 (Allura Red) Hives, Itching and Other (See Comments)    Red food dye   Strawberry Extract Hives and Itching   Tomato Hives and  Itching   Aspirin Hives   Tape Rash and Other (See Comments)    Prefers paper tape   Wound Dressing Adhesive Rash    Discharge Exam: Vitals:   01/31/24 0804 01/31/24 1129  BP:    Pulse:    Resp:    Temp:    SpO2: 99% 92%    Physical Exam Vitals and nursing note reviewed.  Constitutional:      General: She is not in acute distress.    Appearance: She is not toxic-appearing.  HENT:     Head: Normocephalic and atraumatic.     Nose: Nose normal.  Cardiovascular:     Rate and Rhythm: Normal rate and regular rhythm.  Pulmonary:     Effort: No respiratory distress.  Abdominal:     General: Abdomen is flat. Bowel sounds are normal.  Skin:    Capillary Refill: Capillary refill takes less than 2 seconds.  Neurological:     General: No focal deficit present.     Mental Status: She is alert and oriented to person, place, and time.     The results of significant diagnostics from this hospitalization (including imaging, microbiology, ancillary and laboratory) are listed below for reference.    Microbiology: Recent Results (from the past 240 hours)  Resp panel by RT-PCR (RSV, Flu A&B, Covid) Anterior Nasal Swab     Status: None   Collection Time: 01/26/24  4:35 PM   Specimen: Anterior Nasal Swab  Result Value Ref Range Status   SARS Coronavirus 2 by RT PCR NEGATIVE NEGATIVE Final    Comment: (NOTE) SARS-CoV-2 target nucleic acids are NOT DETECTED.  The SARS-CoV-2 RNA is generally detectable in upper respiratory specimens during the acute phase of infection. The lowest concentration of SARS-CoV-2 viral copies this assay can detect is 138 copies/mL. A negative result does not preclude SARS-Cov-2 infection and should not be used as the sole basis for treatment or other patient management decisions. A negative result may occur with  improper specimen collection/handling, submission of specimen other than nasopharyngeal swab, presence of viral mutation(s) within the areas  targeted by this assay, and inadequate number of viral copies(<138 copies/mL). A negative result must be combined with clinical observations, patient history, and epidemiological information. The expected result is Negative.  Fact Sheet for Patients:  BloggerCourse.com  Fact Sheet for Healthcare Providers:  SeriousBroker.it  This test is no t yet approved or cleared by the [place]  FDA and  has been authorized for detection and/or diagnosis of SARS-CoV-2 by FDA under an Emergency Use Authorization (EUA). This EUA will remain  in effect (meaning this test can be used) for the duration of the COVID-19 declaration under Section 564(b)(1) of the Act, 21 U.S.C.section 360bbb-3(b)(1), unless the authorization is terminated  or revoked sooner.       Influenza A by PCR NEGATIVE NEGATIVE Final   Influenza B by PCR NEGATIVE NEGATIVE Final    Comment: (NOTE) The Xpert Xpress SARS-CoV-2/FLU/RSV plus assay is intended as an aid in the diagnosis of influenza from Nasopharyngeal swab specimens and should not be used as a sole basis for treatment. Nasal washings and aspirates are unacceptable for Xpert Xpress SARS-CoV-2/FLU/RSV testing.  Fact Sheet for Patients: BloggerCourse.com  Fact Sheet for Healthcare Providers: SeriousBroker.it  This test is not yet approved or cleared by the [place]  FDA and has been authorized for detection and/or diagnosis of SARS-CoV-2 by FDA under an Emergency Use Authorization (EUA). This EUA will remain in effect (meaning this test can be used) for the duration of the COVID-19 declaration under Section 564(b)(1) of the Act, 21 U.S.C. section 360bbb-3(b)(1), unless the authorization is terminated or revoked.     Resp Syncytial Virus by PCR NEGATIVE NEGATIVE Final    Comment: (NOTE) Fact Sheet for  Patients: BloggerCourse.com  Fact Sheet for Healthcare Providers: SeriousBroker.it  This test is not yet approved or cleared by the [place]  FDA and has been authorized for detection and/or diagnosis of SARS-CoV-2 by FDA under an Emergency Use Authorization (EUA). This EUA will remain in effect (meaning this test can be used) for the duration of the COVID-19 declaration under Section 564(b)(1) of the Act, 21 U.S.C. section 360bbb-3(b)(1), unless the authorization is terminated or revoked.  Performed at [facility], [address], [place], [place] [address]   Resp panel by RT-PCR (RSV, Flu A&B, Covid) Anterior Nasal Swab     Status: None   Collection Time: 01/29/24  7:24 PM   Specimen: Anterior Nasal Swab  Result Value Ref Range Status   SARS Coronavirus 2 by RT PCR NEGATIVE NEGATIVE Final    Comment: (NOTE) SARS-CoV-2 target nucleic acids are NOT DETECTED.  The SARS-CoV-2 RNA is generally detectable in upper respiratory specimens during the acute phase of infection. The lowest concentration of SARS-CoV-2 viral copies this assay can detect is 138 copies/mL. A negative result does not preclude SARS-Cov-2 infection and should not be used as the sole basis for treatment or other patient management decisions. A negative result may occur with  improper specimen collection/handling, submission of specimen other than nasopharyngeal swab, presence of viral mutation(s) within the areas targeted by this assay, and inadequate number of viral copies(<138 copies/mL). A negative result must be combined with clinical observations, patient history, and epidemiological information. The expected result is Negative.  Fact Sheet for Patients:  BloggerCourse.com  Fact Sheet for Healthcare Providers:  SeriousBroker.it  This test is no t yet approved or cleared by the [place] FDA and  has been authorized for detection and/or diagnosis of SARS-CoV-2 by FDA under an Emergency Use Authorization (EUA). This EUA will remain  in effect (meaning this test can be used) for the duration of the COVID-19 declaration under Section 564(b)(1) of the Act, 21 U.S.C.section 360bbb-3(b)(1), unless the authorization is terminated  or revoked sooner.  Influenza A by PCR NEGATIVE NEGATIVE Final   Influenza B by PCR NEGATIVE NEGATIVE Final    Comment: (NOTE) The Xpert Xpress SARS-CoV-2/FLU/RSV plus assay is intended as an aid in the diagnosis of influenza from Nasopharyngeal swab specimens and should not be used as a sole basis for treatment. Nasal washings and aspirates are unacceptable for Xpert Xpress SARS-CoV-2/FLU/RSV testing.  Fact Sheet for Patients: BloggerCourse.com  Fact Sheet for Healthcare Providers: SeriousBroker.it  This test is not yet approved or cleared by the [place]  FDA and has been authorized for detection and/or diagnosis of SARS-CoV-2 by FDA under an Emergency Use Authorization (EUA). This EUA will remain in effect (meaning this test can be used) for the duration of the COVID-19 declaration under Section 564(b)(1) of the Act, 21 U.S.C. section 360bbb-3(b)(1), unless the authorization is terminated or revoked.     Resp Syncytial Virus by PCR NEGATIVE NEGATIVE Final    Comment: (NOTE) Fact Sheet for Patients: BloggerCourse.com  Fact Sheet for Healthcare Providers: SeriousBroker.it  This test is not yet approved or cleared by the [place]  FDA and has been authorized for detection and/or diagnosis of SARS-CoV-2 by FDA under an Emergency Use Authorization (EUA). This EUA will remain in effect (meaning this test can be used) for the duration of the COVID-19 declaration under Section 564(b)(1) of the Act, 21 U.S.C. section  360bbb-3(b)(1), unless the authorization is terminated or revoked.  Performed at [facility], [address], [place], [place] [address]   Respiratory (~20 pathogens) panel by PCR     Status: None   Collection Time: 01/29/24 11:13 PM   Specimen: Nasopharyngeal Swab; Respiratory  Result Value Ref Range Status   Adenovirus NOT DETECTED NOT DETECTED Final   Coronavirus 229E NOT DETECTED NOT DETECTED Final    Comment: (NOTE) The Coronavirus on the Respiratory Panel, DOES NOT test for the novel  Coronavirus (2019 nCoV)    Coronavirus HKU1 NOT DETECTED NOT DETECTED Final   Coronavirus NL63 NOT DETECTED NOT DETECTED Final   Coronavirus OC43 NOT DETECTED NOT DETECTED Final   Metapneumovirus NOT DETECTED NOT DETECTED Final   Rhinovirus / Enterovirus NOT DETECTED NOT DETECTED Final   Influenza A NOT DETECTED NOT DETECTED Final   Influenza B NOT DETECTED NOT DETECTED Final   Parainfluenza Virus 1 NOT DETECTED NOT DETECTED Final   Parainfluenza Virus 2 NOT DETECTED NOT DETECTED Final   Parainfluenza Virus 3 NOT DETECTED NOT DETECTED Final   Parainfluenza Virus 4 NOT DETECTED NOT DETECTED Final   Respiratory Syncytial Virus NOT DETECTED NOT DETECTED Final   Bordetella pertussis NOT DETECTED NOT DETECTED Final   Bordetella Parapertussis NOT DETECTED NOT DETECTED Final   Chlamydophila pneumoniae NOT DETECTED NOT DETECTED Final   Mycoplasma pneumoniae NOT DETECTED NOT DETECTED Final    Comment: Performed at [facility], [address], [place], [place] [address]    Labs: BNP (last 3 results) Recent Labs    12/28/23 1728 01/02/24 1300 01/15/24 0448  BNP 76.4 124.9* 95.3   Basic Metabolic Panel: Recent Labs  Lab 01/26/24 1542 01/29/24 1825 01/30/24 0532  NA 140 142 137  K 4.1 3.0* 4.5  CL 100 106 105  CO2 29 29 24   GLUCOSE 171* 164* 141*  BUN 17 12 15   CREATININE 0.43* 0.45 0.43*  CALCIUM  9.2 8.4* 8.5*   CBC: Recent Labs  Lab 01/26/24 1542  01/29/24 1720 01/30/24 0532  WBC 15.9* 11.3* 8.2  HGB 12.4 9.5* 10.8*  HCT 39.6  31.4* 36.2  MCV 93.8 97.2 98.9  PLT 249 165 178   CBG: Recent Labs  Lab 01/29/24 2149 01/29/24 2231 01/30/24 0741 01/30/24 1139  GLUCAP 187* 165* 99 108*   Hgb A1c Recent Labs    01/29/24 1728  HGBA1C 5.5   Sepsis Labs Recent Labs  Lab 01/26/24 1542 01/29/24 1720 01/30/24 0532  WBC 15.9* 11.3* 8.2   Procedures/Studies: DG Chest 2 View Result Date: 01/29/2024 CLINICAL DATA:  Cough and shortness of breath EXAM: CHEST - 2 VIEW COMPARISON:  Chest x-ray 01/26/2024 FINDINGS: Pleuroparenchymal opacities and volume loss in the left lung apex appear unchanged from prior. The lungs are otherwise clear. No pleural effusion or pneumothorax. The cardiomediastinal silhouette is within normal limits. Tracheal deviation to the left is unchanged. No acute fractures are seen. IMPRESSION: 1. Stable chronic changes in the left lung apex. 2. No acute cardiopulmonary process. Electronically Signed   By: Tyron Gallon M.D.   On: 01/29/2024 19:00   DG Chest 2 View Result Date: 01/26/2024 CLINICAL DATA:  Shortness of breath. EXAM: CHEST - 2 VIEW COMPARISON:  Multiple prior exams, most recent radiograph 01/13/2024. Most recent CT 01/10/2024 FINDINGS: Chronic left lung volume loss. Left lung postsurgical change with stable pleuroparenchymal opacity at the apex. The heart is normal in size. Background emphysema and hyperinflation. No acute airspace disease. No significant pleural effusion. No pulmonary edema. No pneumothorax. Stable osseous structures. IMPRESSION: 1. No acute chest findings. 2. Chronic left lung volume loss and postsurgical change. 3. Background emphysema. Electronically Signed   By: Chadwick Colonel M.D.   On: 01/26/2024 17:53   DG Chest 2 View Result Date: 01/13/2024 CLINICAL DATA:  hx of COPD reporting productive cough EXAM: CHEST - 2 VIEW COMPARISON:  None available. FINDINGS: Emphysema. Persistent pleural  thickening and patchy airspace opacities in the left upper lung zone, likely scarring. No new airspace consolidation or pneumothorax. Blunting of both costophrenic sulci. No cardiomegaly. No acute fracture or destructive lesion. IMPRESSION: 1. Emphysema.  No pneumonia or pulmonary edema. 2. Chronic left upper lung zone scarring and pleural thickening. Electronically Signed   By: Rance Burrows M.D.   On: 01/13/2024 17:50   CT Angio Chest PE W and/or Wo Contrast Result Date: 01/10/2024 CLINICAL DATA:  Shortness of breath and right-sided pain, initial encounter EXAM: CT ANGIOGRAPHY CHEST WITH CONTRAST TECHNIQUE: Multidetector CT imaging of the chest was performed using the standard protocol during bolus administration of intravenous contrast. Multiplanar CT image reconstructions and MIPs were obtained to evaluate the vascular anatomy. RADIATION DOSE REDUCTION: This exam was performed according to the departmental dose-optimization program which includes automated exposure control, adjustment of the mA and/or kV according to patient size and/or use of iterative reconstruction technique. CONTRAST:  80mL OMNIPAQUE  IOHEXOL  350 MG/ML SOLN COMPARISON:  12/13/2023 FINDINGS: Cardiovascular: Thoracic aorta and its branches demonstrate atherosclerotic calcification. No aneurysmal dilatation or dissection is noted. The pulmonary artery shows a normal branching pattern bilaterally. No intraluminal filling defect to suggest pulmonary embolism is seen. Mediastinum/Nodes: Thoracic inlet is within normal limits. Mediastinal shift to the left is noted related to volume loss from prior left upper lobectomy. This is stable from the prior study. No hilar or mediastinal adenopathy is noted. The esophagus is within normal limits. Lungs/Pleura: Lungs are well aerated bilaterally. Diffuse emphysematous changes are identified. Some areas of pleural thickening are noted on the right stable from the prior exam. Stable consolidation in the  posterior aspect of the right upper lobe is again seen. Changes  of prior left upper lobectomy are noted with volume loss as described. Some stable areas of scarring are noted in the residual left lower lobe. No focal confluent infiltrate is seen. Upper Abdomen: No acute abnormality. Musculoskeletal: No chest wall abnormality. No acute or significant osseous findings. Review of the MIP images confirms the above findings. IMPRESSION: Postsurgical changes consistent with left upper lobectomy. No evidence of pulmonary embolism. Chronic changes in both lungs back to prior exams in 2025. Aortic Atherosclerosis (ICD10-I70.0) and Emphysema (ICD10-J43.9). Electronically Signed   By: Violeta Grey M.D.   On: 01/10/2024 21:14   DG Chest Portable 1 View Result Date: 01/02/2024 CLINICAL DATA:  Shortness of breath.  History of lung cancer. EXAM: PORTABLE CHEST 1 VIEW COMPARISON:  Chest radiograph dated 12/28/2023. FINDINGS: Stable cardiomediastinal silhouette. Prior left upper lobectomy with chronic scarring and left sided volume loss. Emphysema with bullous changes again noted in the superior segment of the left lower lobe. No focal consolidation, sizeable pleural effusion, or pneumothorax. No acute osseous abnormality. IMPRESSION: 1. No acute cardiopulmonary findings. 2. Emphysema. 3. Prior left upper lobectomy with chronic scarring and left sided volume loss. Electronically Signed   By: Mannie Seek M.D.   On: 01/02/2024 13:57    Time coordinating discharge: 55 mins  SIGNED:  Unk Garb, DO [facility] 01/31/24, 11:36 AM

## 2024-01-31 NOTE — Plan of Care (Signed)
  Problem: Education: Goal: Ability to describe self-care measures that may prevent or decrease complications (Diabetes Survival Skills Education) will improve Outcome: Progressing Goal: Individualized Educational Video(s) Outcome: Progressing   Problem: Fluid Volume: Goal: Ability to maintain a balanced intake and output will improve Outcome: Progressing   Problem: Health Behavior/Discharge Planning: Goal: Ability to identify and utilize available resources and services will improve Outcome: Progressing   Problem: Metabolic: Goal: Ability to maintain appropriate glucose levels will improve Outcome: Progressing   Problem: Nutritional: Goal: Maintenance of adequate nutrition will improve Outcome: Progressing Goal: Progress toward achieving an optimal weight will improve Outcome: Progressing   Problem: Skin Integrity: Goal: Risk for impaired skin integrity will decrease Outcome: Progressing   Problem: Tissue Perfusion: Goal: Adequacy of tissue perfusion will improve Outcome: Progressing   Problem: Education: Goal: Knowledge of General Education information will improve Description: Including pain rating scale, medication(s)/side effects and non-pharmacologic comfort measures Outcome: Progressing   Problem: Clinical Measurements: Goal: Ability to maintain clinical measurements within normal limits will improve Outcome: Progressing Goal: Will remain free from infection Outcome: Progressing Goal: Diagnostic test results will improve Outcome: Progressing Goal: Cardiovascular complication will be avoided Outcome: Progressing   Problem: Activity: Goal: Risk for activity intolerance will decrease Outcome: Progressing   Problem: Nutrition: Goal: Adequate nutrition will be maintained Outcome: Progressing   Problem: Elimination: Goal: Will not experience complications related to bowel motility Outcome: Progressing Goal: Will not experience complications related to  urinary retention Outcome: Progressing   Problem: Pain Managment: Goal: General experience of comfort will improve and/or be controlled Outcome: Progressing   Problem: Safety: Goal: Ability to remain free from injury will improve Outcome: Progressing   Problem: Skin Integrity: Goal: Risk for impaired skin integrity will decrease Outcome: Progressing   Problem: Education: Goal: Knowledge of the prescribed therapeutic regimen will improve Outcome: Progressing Goal: Individualized Educational Video(s) Outcome: Progressing   Problem: Activity: Goal: Ability to tolerate increased activity will improve Outcome: Progressing Goal: Will verbalize the importance of balancing activity with adequate rest periods Outcome: Progressing   Problem: Respiratory: Goal: Ability to maintain a clear airway will improve Outcome: Progressing Goal: Levels of oxygenation will improve Outcome: Progressing Goal: Ability to maintain adequate ventilation will improve Outcome: Progressing   Problem: Coping: Goal: Ability to adjust to condition or change in health will improve Outcome: Not Progressing   Problem: Health Behavior/Discharge Planning: Goal: Ability to manage health-related needs will improve Outcome: Not Progressing   Problem: Health Behavior/Discharge Planning: Goal: Ability to manage health-related needs will improve Outcome: Not Progressing   Problem: Clinical Measurements: Goal: Respiratory complications will improve Outcome: Not Progressing   Problem: Coping: Goal: Level of anxiety will decrease Outcome: Not Progressing   Problem: Education: Goal: Knowledge of disease or condition will improve Outcome: Not Progressing

## 2024-01-31 NOTE — TOC Transition Note (Signed)
 Transition of Care [facility]) - Discharge Note   Patient Details  Name: Tracy Park MRN: [id number] Date of Birth: [birth year]  [facility] CM[facility]SW Contact:  Jonni Nettle, LCSW Phone Number: 01/31/2024, 12:26 PM   Clinical Narrative:    CSW met with pt at bedside to discuss transportation home upon discharge. Pt reports she has no transportation home. CSW provided [employer]. Pt given and signed rider waiver form. RN to call [facility] when pt is ready to discharge. No change in oxygen  requirements. Pt has travel tank at bedside for d/c. No further TOC needs at this time.   Final next level of care: Home/Self Care Barriers to Discharge: Barriers Resolved   Patient Goals and CMS Choice   Discharge Placement Home  Discharge Plan and Services Additional resources added to the After Visit Summary for Follow Up visits    Social Drivers of Health (SDOH) Interventions SDOH Screenings   Food Insecurity: No Food Insecurity (01/29/2024)  Housing: Low Risk  (01/29/2024)  Transportation Needs: No Transportation Needs (01/29/2024)  Utilities: Not At Risk (01/29/2024)  Financial Resource Strain: High Risk (10/08/2023)   Received from [facility]  Physical Activity: Unknown (04/07/2023)   Received from [facility]  Social Connections: Moderately Integrated (01/29/2024)  Recent Concern: Social Connections - Socially Isolated (12/30/2023)  Stress: Stress Concern Present (04/07/2023)   Received from [facility]  Tobacco Use: High Risk (01/29/2024)     Readmission Risk Interventions    01/14/2024    2:11 PM 01/05/2024   12:08 PM 12/30/2023   11:48 AM  Readmission Risk Prevention Plan  Transportation Screening Complete Complete Complete  Medication Review Oceanographer) Complete Complete Complete  PCP or Specialist appointment within 3-5 days of discharge Complete Complete Complete  HRI or Home Care Consult -- Complete Complete  SW Recovery Care/Counseling  Consult -- Complete Complete  Palliative Care Screening Not Applicable Not Applicable Not Applicable  Skilled Nursing Facility Not Applicable Not Applicable Not Applicable    [place], MSW, LCSW 01/31/2024 12:31 PM

## 2024-02-05 ENCOUNTER — Emergency Department (HOSPITAL_COMMUNITY)

## 2024-02-05 ENCOUNTER — Inpatient Hospital Stay (HOSPITAL_COMMUNITY)
Admission: EM | Admit: 2024-02-05 | Discharge: 2024-02-09 | DRG: 189 | Disposition: A | Discharge status: Hospice | Attending: Internal Medicine | Admitting: Internal Medicine

## 2024-02-05 DIAGNOSIS — Z79899 Other long term (current) drug therapy: Secondary | ICD-10-CM

## 2024-02-05 DIAGNOSIS — E785 Hyperlipidemia, unspecified: Secondary | ICD-10-CM | POA: Diagnosis present

## 2024-02-05 DIAGNOSIS — J9621 Acute and chronic respiratory failure with hypoxia: Principal | ICD-10-CM | POA: Diagnosis present

## 2024-02-05 DIAGNOSIS — J441 Chronic obstructive pulmonary disease with (acute) exacerbation: Principal | ICD-10-CM | POA: Diagnosis present

## 2024-02-05 DIAGNOSIS — Z90710 Acquired absence of both cervix and uterus: Secondary | ICD-10-CM

## 2024-02-05 DIAGNOSIS — Z515 Encounter for palliative care: Secondary | ICD-10-CM

## 2024-02-05 DIAGNOSIS — E039 Hypothyroidism, unspecified: Secondary | ICD-10-CM | POA: Diagnosis present

## 2024-02-05 DIAGNOSIS — F4310 Post-traumatic stress disorder, unspecified: Secondary | ICD-10-CM | POA: Diagnosis present

## 2024-02-05 DIAGNOSIS — Z9109 Other allergy status, other than to drugs and biological substances: Secondary | ICD-10-CM

## 2024-02-05 DIAGNOSIS — I252 Old myocardial infarction: Secondary | ICD-10-CM

## 2024-02-05 DIAGNOSIS — Z885 Allergy status to narcotic agent status: Secondary | ICD-10-CM

## 2024-02-05 DIAGNOSIS — F319 Bipolar disorder, unspecified: Secondary | ICD-10-CM | POA: Diagnosis present

## 2024-02-05 DIAGNOSIS — D630 Anemia in neoplastic disease: Secondary | ICD-10-CM | POA: Diagnosis present

## 2024-02-05 DIAGNOSIS — Z9981 Dependence on supplemental oxygen: Secondary | ICD-10-CM

## 2024-02-05 DIAGNOSIS — Z7901 Long term (current) use of anticoagulants: Secondary | ICD-10-CM

## 2024-02-05 DIAGNOSIS — E890 Postprocedural hypothyroidism: Secondary | ICD-10-CM | POA: Diagnosis present

## 2024-02-05 DIAGNOSIS — Z85118 Personal history of other malignant neoplasm of bronchus and lung: Secondary | ICD-10-CM

## 2024-02-05 DIAGNOSIS — Z79890 Hormone replacement therapy (postmenopausal): Secondary | ICD-10-CM

## 2024-02-05 DIAGNOSIS — Z8249 Family history of ischemic heart disease and other diseases of the circulatory system: Secondary | ICD-10-CM

## 2024-02-05 DIAGNOSIS — Z902 Acquired absence of lung [part of]: Secondary | ICD-10-CM

## 2024-02-05 DIAGNOSIS — E876 Hypokalemia: Secondary | ICD-10-CM | POA: Insufficient documentation

## 2024-02-05 DIAGNOSIS — I480 Paroxysmal atrial fibrillation: Secondary | ICD-10-CM | POA: Diagnosis present

## 2024-02-05 DIAGNOSIS — Z886 Allergy status to analgesic agent status: Secondary | ICD-10-CM

## 2024-02-05 DIAGNOSIS — F32A Depression, unspecified: Secondary | ICD-10-CM | POA: Diagnosis present

## 2024-02-05 DIAGNOSIS — Z87442 Personal history of urinary calculi: Secondary | ICD-10-CM

## 2024-02-05 DIAGNOSIS — I10 Essential (primary) hypertension: Secondary | ICD-10-CM | POA: Diagnosis present

## 2024-02-05 DIAGNOSIS — G4733 Obstructive sleep apnea (adult) (pediatric): Secondary | ICD-10-CM | POA: Diagnosis present

## 2024-02-05 DIAGNOSIS — Z1152 Encounter for screening for COVID-19: Secondary | ICD-10-CM

## 2024-02-05 DIAGNOSIS — F17210 Nicotine dependence, cigarettes, uncomplicated: Secondary | ICD-10-CM | POA: Diagnosis present

## 2024-02-05 DIAGNOSIS — D649 Anemia, unspecified: Secondary | ICD-10-CM | POA: Diagnosis present

## 2024-02-05 DIAGNOSIS — Z833 Family history of diabetes mellitus: Secondary | ICD-10-CM

## 2024-02-05 DIAGNOSIS — Z716 Tobacco abuse counseling: Secondary | ICD-10-CM

## 2024-02-05 DIAGNOSIS — F419 Anxiety disorder, unspecified: Secondary | ICD-10-CM | POA: Diagnosis present

## 2024-02-05 DIAGNOSIS — J9611 Chronic respiratory failure with hypoxia: Secondary | ICD-10-CM | POA: Diagnosis present

## 2024-02-05 DIAGNOSIS — Z66 Do not resuscitate: Secondary | ICD-10-CM | POA: Diagnosis present

## 2024-02-05 LAB — COMPREHENSIVE METABOLIC PANEL WITH GFR
ALT: 14 U/L (ref 0–44)
AST: 13 U/L — ABNORMAL LOW (ref 15–41)
Albumin: 3 g/dL — ABNORMAL LOW (ref 3.5–5.0)
Alkaline Phosphatase: 50 U/L (ref 38–126)
Anion gap: 9 (ref 5–15)
BUN: 20 mg/dL (ref 8–23)
CO2: 26 mmol/L (ref 22–32)
Calcium: 7.5 mg/dL — ABNORMAL LOW (ref 8.9–10.3)
Chloride: 109 mmol/L (ref 98–111)
Creatinine, Ser: 0.48 mg/dL (ref 0.44–1.00)
GFR, Estimated: >60 mL/min (ref >60)
Glucose, Bld: 167 mg/dL — ABNORMAL HIGH (ref 70–99)
Potassium: 3.3 mmol/L — ABNORMAL LOW (ref 3.5–5.1)
Sodium: 144 mmol/L (ref 135–145)
Total Bilirubin: 0.4 mg/dL (ref 0.0–1.2)
Total Protein: 5.5 g/dL — ABNORMAL LOW (ref 6.5–8.1)

## 2024-02-05 LAB — CBC WITH DIFFERENTIAL/PLATELET
Abs Immature Granulocytes: 0.05 10*3/uL (ref 0.00–0.07)
Basophils Absolute: 0 10*3/uL (ref 0.0–0.1)
Basophils Relative: 0 %
Eosinophils Absolute: 0 10*3/uL (ref 0.0–0.5)
Eosinophils Relative: 0 %
HCT: 37.9 % (ref 36.0–46.0)
Hemoglobin: 11.7 g/dL — ABNORMAL LOW (ref 12.0–15.0)
Immature Granulocytes: 1 %
Lymphocytes Relative: 4 %
Lymphs Abs: 0.3 10*3/uL — ABNORMAL LOW (ref 0.7–4.0)
MCH: 29.8 pg (ref 26.0–34.0)
MCHC: 30.9 g/dL (ref 30.0–36.0)
MCV: 96.4 fL (ref 80.0–100.0)
Monocytes Absolute: 0.1 10*3/uL (ref 0.1–1.0)
Monocytes Relative: 1 %
Neutro Abs: 7.1 10*3/uL (ref 1.7–7.7)
Neutrophils Relative %: 94 %
Platelets: 181 10*3/uL (ref 150–400)
RBC: 3.93 MIL/uL (ref 3.87–5.11)
RDW: 14.1 % (ref 11.5–15.5)
WBC: 7.7 10*3/uL (ref 4.0–10.5)
nRBC: 0 % (ref 0.0–0.2)

## 2024-02-05 LAB — BLOOD GAS, VENOUS
Acid-Base Excess: 7.9 mmol/L — ABNORMAL HIGH (ref 0.0–2.0)
Bicarbonate: 35.6 mmol/L — ABNORMAL HIGH (ref 20.0–28.0)
O2 Saturation: 88.6 %
Patient temperature: 37
pCO2, Ven: 63 mmHg — ABNORMAL HIGH (ref 44–60)
pH, Ven: 7.36 (ref 7.25–7.43)
pO2, Ven: 49 mmHg — ABNORMAL HIGH (ref 32–45)

## 2024-02-05 LAB — TROPONIN I (HIGH SENSITIVITY)
Troponin I (High Sensitivity): 8 ng/L (ref <18)
Troponin I (High Sensitivity): 8 ng/L (ref <18)

## 2024-02-05 LAB — I-STAT CG4 LACTIC ACID, ED
Lactic Acid, Venous: 1.2 mmol/L (ref 0.5–1.9)
Lactic Acid, Venous: 0.9 mmol/L (ref 0.5–1.9)

## 2024-02-05 LAB — BRAIN NATRIURETIC PEPTIDE: B Natriuretic Peptide: 94.4 pg/mL (ref 0.0–100.0)

## 2024-02-05 MED ORDER — IPRATROPIUM-ALBUTEROL 0.5-2.5 (3) MG/3ML IN SOLN
RESPIRATORY_TRACT | Status: AC
  Administered 2024-02-05: 3 mL
  Filled 2024-02-05: qty 3

## 2024-02-05 MED ORDER — IPRATROPIUM-ALBUTEROL 0.5-2.5 (3) MG/3ML IN SOLN: 3.0000 mL | Freq: Once | RESPIRATORY_TRACT | Status: DC

## 2024-02-05 NOTE — ED Provider Notes (Signed)
 [facility]PART[facility] AT [facility] Provider Note   CSN: [id number] Arrival date & time: 02/05/24  1729     History  Chief Complaint  Patient presents with   Shortness of Breath    Tracy Park is a 68 y.o. female.  68 year old female with prior medical history as detailed below presents for evaluation.  Patient with longstanding history of COPD.  She uses 2 L nasal cannula at baseline.  She reports that she still smokes.  Patient reports increased cough and wheezing x 24 hours.  Patient was not feeling improved with use of medications at home.  EMS transported to the ED.  125 mg of Solu-Medrol  given during transport.  Patient placed on CPAP during transport.  On arrival,  patient placed on BiPAP.  Patient feels improved with BiPAP.  The history is provided by the patient.       Home Medications Prior to Admission medications   Medication Sig Start Date End Date Taking? Authorizing Provider  albuterol  (PROVENTIL ) (2.5 MG/3ML) 0.083% nebulizer solution Take 2.5 mg by nebulization every 4 (four) hours as needed for wheezing or shortness of breath.    [provider]  albuterol  (VENTOLIN  HFA) 108 (90 Base) MCG/ACT inhaler Inhale 2 puffs into the lungs every 6 (six) hours as needed for wheezing or shortness of breath. 11/07/23   Amin, Ankit C, MD  apixaban  (ELIQUIS ) 5 MG TABS tablet Take 1 tablet (5 mg total) by mouth 2 (two) times daily. 06/06/23   Lorita Rosa, MD  budesonide -glycopyrrolate -formoterol  (BREZTRI  AEROSPHERE) 160-9-4.8 MCG/ACT AERO inhaler Inhale 1 puff into the lungs in the morning and at bedtime. Patient taking differently: Inhale 2 puffs into the lungs in the morning and at bedtime. 01/23/24 02/22/24  Chatterjee, Srobona Tublu, MD  levothyroxine  (SYNTHROID ) 125 MCG tablet Take 125 mcg by mouth daily before breakfast.    [provider]  magnesium  oxide (MAG-OX) 400 (240 Mg) MG tablet Take 400 mg by mouth in the morning.     [provider]  metoprolol  succinate (TOPROL -XL) 25 MG 24 hr tablet Take 12.5 mg by mouth daily.    [provider]  nitroGLYCERIN  (NITROSTAT ) 0.4 MG SL tablet Place 0.4 mg under the tongue every 5 (five) minutes as needed for chest pain.    [provider]  oxyCODONE  (OXY IR/ROXICODONE ) 5 MG immediate release tablet Take 1 tablet (5 mg total) by mouth every 6 (six) hours as needed (shortness of breath, air hunger). 01/31/24   Unk Garb, DO  OXYGEN  Inhale 2 L/min into the lungs continuous.    [provider]  polyvinyl alcohol  (LIQUIFILM TEARS) 1.4 % ophthalmic solution Place 1 drop into both eyes in the morning, at noon, in the evening, and at bedtime.    [provider]  prednisoLONE  acetate (PRED FORTE ) 1 % ophthalmic suspension Place 1 drop into the left eye 4 (four) times daily. 11/19/23   [provider]  predniSONE  (DELTASONE ) 20 MG tablet Take 2 tablets (40 mg total) by mouth daily with breakfast for 3 days, THEN 1.5 tablets (30 mg total) daily with breakfast for 3 days, THEN 1 tablet (20 mg total) daily with breakfast for 3 days, THEN 0.5 tablets (10 mg total) daily with breakfast for 3 days. 02/01/24 02/13/24  Unk Garb, DO  rosuvastatin  (CRESTOR ) 20 MG tablet Take 20 mg by mouth daily.    [provider]  sertraline  (ZOLOFT ) 25 MG tablet Take 25 mg by mouth in the morning.  [provider]  sodium chloride  HYPERTONIC 3 % nebulizer solution Take 4 mLs by nebulization 3 (three) times daily for 15 days. Vials are single use vials. Patient taking differently: Take 4 mLs by nebulization in the morning and at bedtime. Vials are single use vials. 01/23/24 02/07/24  Chatterjee, Srobona Tublu, MD  SYSTANE COMPLETE PF 0.6 % SOLN Place 1 drop into both eyes 4 (four) times daily as needed.    [provider]      Allergies    Red dye #40 (allura red), Strawberry extract, Tomato, Aspirin, Tape, and Wound dressing adhesive     Review of Systems   Review of Systems  All other systems reviewed and are negative.   Physical Exam Updated Vital Signs Pulse 95   Resp (!) 32   SpO2 100%  Physical Exam Vitals and nursing note reviewed.  Constitutional:      General: She is not in acute distress.    Appearance: Normal appearance. She is well-developed.  HENT:     Head: Normocephalic and atraumatic.  Eyes:     Conjunctiva/sclera: Conjunctivae normal.     Pupils: Pupils are equal, round, and reactive to light.  Cardiovascular:     Rate and Rhythm: Normal rate and regular rhythm.     Heart sounds: Normal heart sounds.  Pulmonary:     Effort: Tachypnea present. No respiratory distress.     Comments: Diffuse expiratory wheezes in all lung fields Abdominal:     General: There is no distension.     Palpations: Abdomen is soft.     Tenderness: There is no abdominal tenderness.  Musculoskeletal:        General: No deformity. Normal range of motion.     Cervical back: Normal range of motion and neck supple.  Skin:    General: Skin is warm and dry.  Neurological:     General: No focal deficit present.     Mental Status: She is alert and oriented to person, place, and time.     ED Results / Procedures / Treatments   Labs (all labs ordered are listed, but only abnormal results are displayed) Labs Reviewed  CBC WITH DIFFERENTIAL/PLATELET  COMPREHENSIVE METABOLIC PANEL WITH GFR  BLOOD GAS, VENOUS  BRAIN NATRIURETIC PEPTIDE  I-STAT CHEM 8, ED  I-STAT CG4 LACTIC ACID, ED  TROPONIN I (HIGH SENSITIVITY)    EKG None  Radiology No results found.  Procedures Procedures    Medications Ordered in ED Medications - No data to display  ED Course/ Medical Decision Making/ A&P                                 Medical Decision Making Amount and/or Complexity of Data Reviewed Labs: ordered. Radiology: ordered.  Risk Prescription drug management. Decision regarding hospitalization.    Medical  Screen Complete  This patient presented to the ED with complaint of shortness of breath, cough, fever.  This complaint involves an extensive number of treatment options. The initial differential diagnosis includes, but is not limited to, pneumonia, COPD exacerbation, etc.  This presentation is: Acute, Chronic, Self-Limited, Previously Undiagnosed, Uncertain Prognosis, Complicated, Systemic Symptoms, and Threat to Life/Bodily Function  Patient is presenting with increased wheezing, cough, shortness of breath.  Patient placed on CPAP with EMS and then BiPAP in the ED.  Patient given steroids and bronchodilators.  Patient is improving.  Patient would benefit from admission for further workup and  treatment.  Hospitalist service is made aware of case.   Additional history obtained: External records from outside sources obtained and reviewed including prior ED visits and prior Inpatient records.   Problem List / ED Course:  COPD exacerbation   Reevaluation:  After the interventions noted above, I reevaluated the patient and found that they have: improved Disposition:  After consideration of the diagnostic results and the patients response to treatment, I feel that the patent would benefit from admission.          Final Clinical Impression(s) / ED Diagnoses Final diagnoses:  COPD exacerbation [facility])    Rx / DC Orders ED Discharge Orders     None         Burnette Carte, MD 02/05/24 2206

## 2024-02-05 NOTE — ED Triage Notes (Signed)
 Per EMS from home. COPD exacerbation. Hx emphysema. SOB since this morning and getting worse. No improvement with home meds.  BP 122/76 HR 106 99 on 2L

## 2024-02-05 NOTE — H&P (Signed)
 History and Physical    Tracy Park WNU:[id number] DOB: 07-18-1956 DOA: 02/05/2024  Patient coming from: Home.  Chief Complaint: Shortness of breath.  HPI: Tracy Park is a 68 y.o. female with history of COPD with chronic respiratory failure on 2 L oxygen , lung cancer status post left lobectomy, paroxysmal atrial fibrillation on Eliquis , hypertension, hyperlipidemia who was recently admitted to the hospital from 01/29/2024 through 01/31/2024 for COPD exacerbation also had multiple recent admissions for the same presents to the ER because of worsening shortness of breath since discharge with nonproductive cough.  Denies chest pain but had some diaphoresis.  Patient still smokes cigarettes.  ED Course: In the ER patient was wheezing diffusely with chest x-ray showing nothing acute.  Labs show mild hypokalemia.  Troponins and BNP were unremarkable.  Patient was given potassium replacement and on nebulizer steroids admitted for COPD exacerbation.  VBG shows pH of 7.36.  pCO2 of 63.  Review of Systems: As per HPI, rest all negative.   Past Medical History:  Diagnosis Date   Adenocarcinoma of lung (HCC) 02/26/2022   Anginal pain (HCC)    Anxiety    Bipolar disorder (HCC)    Cancer (HCC)    COPD (chronic obstructive pulmonary disease) (HCC)    Dyspnea    Family history of adverse reaction to anesthesia    History of depression 08/03/2023   History of kidney stones    Hydroureteronephrosis 08/16/2021   Hypothyroidism    Lung cancer (HCC)    Malignant neoplasm of upper lobe of left lung (HCC) 11/12/2016   Myocardial infarction (HCC)    Paroxysmal atrial fibrillation (HCC)    Paroxysmal atrial fibrillation with RVR (HCC) 01/25/2023   PTSD (post-traumatic stress disorder)    Rib pain on left side 06/24/2023   Sleep apnea    Thyroid  disease     Past Surgical History:  Procedure Laterality Date   ABDOMINAL HYSTERECTOMY     BACK SURGERY     CYSTOSCOPY W/ URETERAL STENT PLACEMENT  Right 05/03/2021   Procedure: CYSTOSCOPY WITH RETROGRADE PYELOGRAM/URETERAL STENT PLACEMENT;  Surgeon: Andrez Banker, MD;  Location: [facility];  Service: Urology;  Laterality: Right;   EYE SURGERY     kidney stent     thyroidectomy       reports that she has been smoking cigarettes. She has never used smokeless tobacco. She reports that she does not currently use alcohol . She reports that she does not currently use drugs.  Allergies  Allergen Reactions   Red Dye #40 (Allura Red) Hives, Itching and Other (See Comments)    Red food dye   Strawberry Extract Hives and Itching   Tomato Hives and Itching   Aspirin Hives   Tape Rash and Other (See Comments)    Prefers paper tape   Wound Dressing Adhesive Rash    Family History  Problem Relation Age of Onset   Hypertension Mother    Heart failure Mother    Hypertension Father    Diabetes Father    Heart failure Father     Prior to Admission medications   Medication Sig Start Date End Date Taking? Authorizing Provider  albuterol  (PROVENTIL ) (2.5 MG/3ML) 0.083% nebulizer solution Take 2.5 mg by nebulization every 4 (four) hours as needed for wheezing or shortness of breath.   Yes [provider]  albuterol  (VENTOLIN  HFA) 108 (90 Base) MCG/ACT inhaler Inhale 2 puffs into the lungs every 6 (six) hours as needed for wheezing or shortness of breath.  11/07/23  Yes Amin, Ankit C, MD  apixaban  (ELIQUIS ) 5 MG TABS tablet Take 1 tablet (5 mg total) by mouth 2 (two) times daily. 06/06/23  Yes Lorita Rosa, MD  budesonide -glycopyrrolate -formoterol  (BREZTRI  AEROSPHERE) 160-9-4.8 MCG/ACT AERO inhaler Inhale 1 puff into the lungs in the morning and at bedtime. Patient taking differently: Inhale 2 puffs into the lungs in the morning and at bedtime. 01/23/24 02/22/24 Yes Chatterjee, Srobona Tublu, MD  levothyroxine  (SYNTHROID ) 125 MCG tablet Take 125 mcg by mouth daily before breakfast.   Yes [provider]  magnesium  oxide (MAG-OX)  400 (240 Mg) MG tablet Take 400 mg by mouth in the morning.   Yes [provider]  metoprolol  succinate (TOPROL -XL) 25 MG 24 hr tablet Take 12.5 mg by mouth daily.   Yes [provider]  nitroGLYCERIN  (NITROSTAT ) 0.4 MG SL tablet Place 0.4 mg under the tongue every 5 (five) minutes as needed for chest pain.   Yes [provider]  oxyCODONE  (OXY IR/ROXICODONE ) 5 MG immediate release tablet Take 1 tablet (5 mg total) by mouth every 6 (six) hours as needed (shortness of breath, air hunger). 01/31/24  Yes Unk Garb, DO  OXYGEN  Inhale 2 L/min into the lungs continuous.   Yes [provider]  polyvinyl alcohol  (LIQUIFILM TEARS) 1.4 % ophthalmic solution Place 1 drop into both eyes in the morning, at noon, in the evening, and at bedtime.   Yes [provider]  prednisoLONE  acetate (PRED FORTE ) 1 % ophthalmic suspension Place 1 drop into the left eye 4 (four) times daily. 11/19/23  Yes [provider]  predniSONE  (DELTASONE ) 20 MG tablet Take 2 tablets (40 mg total) by mouth daily with breakfast for 3 days, THEN 1.5 tablets (30 mg total) daily with breakfast for 3 days, THEN 1 tablet (20 mg total) daily with breakfast for 3 days, THEN 0.5 tablets (10 mg total) daily with breakfast for 3 days. Patient not taking: Reported on 02/05/2024 02/01/24 02/13/24  Unk Garb, DO  rosuvastatin  (CRESTOR ) 20 MG tablet Take 20 mg by mouth daily.   Yes [provider]  sertraline  (ZOLOFT ) 25 MG tablet Take 25 mg by mouth in the morning.   Yes [provider]  sodium chloride  HYPERTONIC 3 % nebulizer solution Take 4 mLs by nebulization 3 (three) times daily for 15 days. Vials are single use vials. Patient taking differently: Take 4 mLs by nebulization in the morning and at bedtime. Vials are single use vials. 01/23/24 02/07/24 Yes Chatterjee, Srobona Tublu, MD  SYSTANE COMPLETE PF 0.6 % SOLN Place 1 drop into both eyes 4 (four) times daily as needed.   Yes  [provider]    Physical Exam: Constitutional: Moderately built and nourished. Vitals:   02/05/24 2115 02/05/24 2130 02/05/24 2200 02/05/24 2238  BP: 115/67 109/69 101/76   Pulse: 78 76 85   Resp: (!) 21 20 (!) 23   Temp:    97.7 F (36.5 C)  SpO2: 99% 99% 99%    Eyes: Anicteric no pallor. ENMT: No discharge from the ears eyes nose and mouth. Neck: No mass felt.  No neck rigidity. Respiratory: Bilateral expiratory wheeze and no crepitations. Cardiovascular: S1-S2 heard. Abdomen: Soft nontender bowel sound present. Musculoskeletal: No edema. Skin: No rash. Neurologic: Alert awake oriented time place and person.  Moves all extremities. Psychiatric: Appears normal.  Normal affect.   Labs on Admission: I have personally reviewed following labs and imaging studies  CBC: Recent Labs  Lab 01/30/24 0532 02/05/24  1907  WBC 8.2 7.7  NEUTROABS  --  7.1  HGB 10.8* 11.7*  HCT 36.2 37.9  MCV 98.9 96.4  PLT 178 181   Basic Metabolic Panel: Recent Labs  Lab 01/30/24 0532 02/05/24 1907  NA 137 144  K 4.5 3.3*  CL 105 109  CO2 24 26  GLUCOSE 141* 167*  BUN 15 20  CREATININE 0.43* 0.48  CALCIUM  8.5* 7.5*   GFR: Estimated Creatinine Clearance: 58 mL/min (by C-G formula based on SCr of 0.48 mg/dL). Liver Function Tests: Recent Labs  Lab 02/05/24 1907  AST 13*  ALT 14  ALKPHOS 50  BILITOT 0.4  PROT 5.5*  ALBUMIN 3.0*   No results for input(s): "LIPASE", "AMYLASE" in the last 168 hours. No results for input(s): "AMMONIA" in the last 168 hours. Coagulation Profile: No results for input(s): "INR", "PROTIME" in the last 168 hours. Cardiac Enzymes: No results for input(s): "CKTOTAL", "CKMB", "CKMBINDEX", "TROPONINI" in the last 168 hours. BNP (last 3 results) No results for input(s): "PROBNP" in the last 8760 hours. HbA1C: No results for input(s): "HGBA1C" in the last 72 hours. CBG: Recent Labs  Lab 01/30/24 0741 01/30/24 1139  GLUCAP 99 108*    Lipid Profile: No results for input(s): "CHOL", "HDL", "LDLCALC", "TRIG", "CHOLHDL", "LDLDIRECT" in the last 72 hours. Thyroid  Function Tests: No results for input(s): "TSH", "T4TOTAL", "FREET4", "T3FREE", "THYROIDAB" in the last 72 hours. Anemia Panel: No results for input(s): "VITAMINB12", "FOLATE", "FERRITIN", "TIBC", "IRON ", "RETICCTPCT" in the last 72 hours. Urine analysis:    Component Value Date/Time   COLORURINE YELLOW 06/25/2023 0536   APPEARANCEUR CLEAR 06/25/2023 0536   LABSPEC 1.023 06/25/2023 0536   PHURINE 6.0 06/25/2023 0536   GLUCOSEU 50 (A) 06/25/2023 0536   HGBUR SMALL (A) 06/25/2023 0536   BILIRUBINUR NEGATIVE 06/25/2023 0536   KETONESUR NEGATIVE 06/25/2023 0536   PROTEINUR NEGATIVE 06/25/2023 0536   UROBILINOGEN 0.2 11/06/2009 1559   NITRITE NEGATIVE 06/25/2023 0536   LEUKOCYTESUR NEGATIVE 06/25/2023 0536   Sepsis Labs: @LABRCNTIP (procalcitonin:4,lacticidven:4) ) Recent Results (from the past 240 hours)  Resp panel by RT-PCR (RSV, Flu A&B, Covid) Anterior Nasal Swab     Status: None   Collection Time: 01/29/24  7:24 PM   Specimen: Anterior Nasal Swab  Result Value Ref Range Status   SARS Coronavirus 2 by RT PCR NEGATIVE NEGATIVE Final    Comment: (NOTE) SARS-CoV-2 target nucleic acids are NOT DETECTED.  The SARS-CoV-2 RNA is generally detectable in upper respiratory specimens during the acute phase of infection. The lowest concentration of SARS-CoV-2 viral copies this assay can detect is 138 copies/mL. A negative result does not preclude SARS-Cov-2 infection and should not be used as the sole basis for treatment or other patient management decisions. A negative result may occur with  improper specimen collection/handling, submission of specimen other than nasopharyngeal swab, presence of viral mutation(s) within the areas targeted by this assay, and inadequate number of viral copies(<138 copies/mL). A negative result must be combined with clinical  observations, patient history, and epidemiological information. The expected result is Negative.  Fact Sheet for Patients:  BloggerCourse.com  Fact Sheet for Healthcare Providers:  SeriousBroker.it  This test is no t yet approved or cleared by the [place]  FDA and  has been authorized for detection and/or diagnosis of SARS-CoV-2 by FDA under an Emergency Use Authorization (EUA). This EUA will remain  in effect (meaning this test can be used) for the duration of the COVID-19 declaration under Section 564(b)(1) of the Act,  21 U.S.C.section 360bbb-3(b)(1), unless the authorization is terminated  or revoked sooner.       Influenza A by PCR NEGATIVE NEGATIVE Final   Influenza B by PCR NEGATIVE NEGATIVE Final    Comment: (NOTE) The Xpert Xpress SARS-CoV-2/FLU/RSV plus assay is intended as an aid in the diagnosis of influenza from Nasopharyngeal swab specimens and should not be used as a sole basis for treatment. Nasal washings and aspirates are unacceptable for Xpert Xpress SARS-CoV-2/FLU/RSV testing.  Fact Sheet for Patients: BloggerCourse.com  Fact Sheet for Healthcare Providers: SeriousBroker.it  This test is not yet approved or cleared by the [place]  FDA and has been authorized for detection and/or diagnosis of SARS-CoV-2 by FDA under an Emergency Use Authorization (EUA). This EUA will remain in effect (meaning this test can be used) for the duration of the COVID-19 declaration under Section 564(b)(1) of the Act, 21 U.S.C. section 360bbb-3(b)(1), unless the authorization is terminated or revoked.     Resp Syncytial Virus by PCR NEGATIVE NEGATIVE Final    Comment: (NOTE) Fact Sheet for Patients: BloggerCourse.com  Fact Sheet for Healthcare Providers: SeriousBroker.it  This test is not yet approved or cleared by  the [place]  FDA and has been authorized for detection and/or diagnosis of SARS-CoV-2 by FDA under an Emergency Use Authorization (EUA). This EUA will remain in effect (meaning this test can be used) for the duration of the COVID-19 declaration under Section 564(b)(1) of the Act, 21 U.S.C. section 360bbb-3(b)(1), unless the authorization is terminated or revoked.  Performed at [facility], [address] [place], [place] [address]   Respiratory (~20 pathogens) panel by PCR     Status: None   Collection Time: 01/29/24 11:13 PM   Specimen: Nasopharyngeal Swab; Respiratory  Result Value Ref Range Status   Adenovirus NOT DETECTED NOT DETECTED Final   Coronavirus 229E NOT DETECTED NOT DETECTED Final    Comment: (NOTE) The Coronavirus on the Respiratory Panel, DOES NOT test for the novel  Coronavirus (2019 nCoV)    Coronavirus HKU1 NOT DETECTED NOT DETECTED Final   Coronavirus NL63 NOT DETECTED NOT DETECTED Final   Coronavirus OC43 NOT DETECTED NOT DETECTED Final   Metapneumovirus NOT DETECTED NOT DETECTED Final   Rhinovirus / Enterovirus NOT DETECTED NOT DETECTED Final   Influenza A NOT DETECTED NOT DETECTED Final   Influenza B NOT DETECTED NOT DETECTED Final   Parainfluenza Virus 1 NOT DETECTED NOT DETECTED Final   Parainfluenza Virus 2 NOT DETECTED NOT DETECTED Final   Parainfluenza Virus 3 NOT DETECTED NOT DETECTED Final   Parainfluenza Virus 4 NOT DETECTED NOT DETECTED Final   Respiratory Syncytial Virus NOT DETECTED NOT DETECTED Final   Bordetella pertussis NOT DETECTED NOT DETECTED Final   Bordetella Parapertussis NOT DETECTED NOT DETECTED Final   Chlamydophila pneumoniae NOT DETECTED NOT DETECTED Final   Mycoplasma pneumoniae NOT DETECTED NOT DETECTED Final    Comment: Performed at [facility], [address] [place], [place] [address]     Radiological Exams on Admission: DG Chest Port 1 View Result Date: 02/05/2024 CLINICAL DATA:   Shortness of breath for 1 month EXAM: PORTABLE CHEST 1 VIEW COMPARISON:  01/29/2024 FINDINGS: No change from 01/29/2024. Emphysema. Pleural-parenchymal opacities with volume loss and scarring in the left apex. The right lung is clear. Postoperative change about the left upper lobe. Chronic pleural thickening or small pleural effusion in left lung. No pneumothorax. Cardiomediastinal silhouette within normal limits. IMPRESSION: No change from 01/29/2024. Emphysema with pleural-parenchymal  opacities and volume loss/scarring in the left apex. Electronically Signed   By: Rozell Cornet M.D.   On: 02/05/2024 19:35     Assessment/Plan Active Problems:   Acute on chronic respiratory failure with hypoxia (HCC)   History of lung cancer   Chronic respiratory failure with hypoxia (HCC)   Hypothyroidism   Dyslipidemia   Normocytic anemia   COPD exacerbation (HCC)    COPD exacerbation with chronic respiratory failure on 2 L oxygen  will continue with scheduled and as needed DuoNebs Pulmicort  and antibiotics.  Patient is able to complete sentences without difficulty. Paroxysmal atrial fibrillation on metoprolol  and Eliquis . Hypothyroidism on Synthroid . Hypertension on metoprolol . Hypokalemia replace recheck. Hyperlipidemia on statins. Anemia follow CBC. Depression on Zoloft . History of lung cancer status post lobectomy.  Since patient has COPD exacerbation will need close monitoring and further workup and more than 2 midnight stay.   DVT prophylaxis: Lovenox . Code Status: Full code. Family Communication: Discussed with patient. Disposition Plan: Medical floor. Consults called: None. Admission status: Observation.

## 2024-02-05 NOTE — Progress Notes (Signed)
 Trial pt off bipap at this time per MD. Pt placed on 2L [place]. PT seems comfortable, vital signs stable.

## 2024-02-06 ENCOUNTER — Encounter (HOSPITAL_COMMUNITY): Payer: Self-pay | Admitting: Internal Medicine

## 2024-02-06 DIAGNOSIS — J441 Chronic obstructive pulmonary disease with (acute) exacerbation: Secondary | ICD-10-CM | POA: Diagnosis present

## 2024-02-06 DIAGNOSIS — E785 Hyperlipidemia, unspecified: Secondary | ICD-10-CM | POA: Diagnosis present

## 2024-02-06 DIAGNOSIS — Z85118 Personal history of other malignant neoplasm of bronchus and lung: Secondary | ICD-10-CM

## 2024-02-06 DIAGNOSIS — I480 Paroxysmal atrial fibrillation: Secondary | ICD-10-CM | POA: Diagnosis present

## 2024-02-06 DIAGNOSIS — J9621 Acute and chronic respiratory failure with hypoxia: Secondary | ICD-10-CM | POA: Diagnosis present

## 2024-02-06 DIAGNOSIS — E876 Hypokalemia: Secondary | ICD-10-CM | POA: Diagnosis present

## 2024-02-06 DIAGNOSIS — G4733 Obstructive sleep apnea (adult) (pediatric): Secondary | ICD-10-CM | POA: Diagnosis present

## 2024-02-06 DIAGNOSIS — I10 Essential (primary) hypertension: Secondary | ICD-10-CM | POA: Diagnosis present

## 2024-02-06 DIAGNOSIS — Z515 Encounter for palliative care: Secondary | ICD-10-CM | POA: Diagnosis not present

## 2024-02-06 DIAGNOSIS — D630 Anemia in neoplastic disease: Secondary | ICD-10-CM | POA: Diagnosis present

## 2024-02-06 DIAGNOSIS — Z902 Acquired absence of lung [part of]: Secondary | ICD-10-CM | POA: Diagnosis not present

## 2024-02-06 DIAGNOSIS — Z8249 Family history of ischemic heart disease and other diseases of the circulatory system: Secondary | ICD-10-CM | POA: Diagnosis not present

## 2024-02-06 DIAGNOSIS — Z1152 Encounter for screening for COVID-19: Secondary | ICD-10-CM | POA: Diagnosis not present

## 2024-02-06 DIAGNOSIS — Z79890 Hormone replacement therapy (postmenopausal): Secondary | ICD-10-CM | POA: Diagnosis not present

## 2024-02-06 DIAGNOSIS — R918 Other nonspecific abnormal finding of lung field: Secondary | ICD-10-CM

## 2024-02-06 DIAGNOSIS — E890 Postprocedural hypothyroidism: Secondary | ICD-10-CM | POA: Diagnosis present

## 2024-02-06 DIAGNOSIS — F319 Bipolar disorder, unspecified: Secondary | ICD-10-CM | POA: Diagnosis present

## 2024-02-06 DIAGNOSIS — F419 Anxiety disorder, unspecified: Secondary | ICD-10-CM | POA: Diagnosis present

## 2024-02-06 DIAGNOSIS — Z885 Allergy status to narcotic agent status: Secondary | ICD-10-CM | POA: Diagnosis not present

## 2024-02-06 DIAGNOSIS — Z66 Do not resuscitate: Secondary | ICD-10-CM | POA: Diagnosis present

## 2024-02-06 DIAGNOSIS — I252 Old myocardial infarction: Secondary | ICD-10-CM | POA: Diagnosis not present

## 2024-02-06 DIAGNOSIS — Z9109 Other allergy status, other than to drugs and biological substances: Secondary | ICD-10-CM | POA: Diagnosis not present

## 2024-02-06 DIAGNOSIS — Z886 Allergy status to analgesic agent status: Secondary | ICD-10-CM | POA: Diagnosis not present

## 2024-02-06 DIAGNOSIS — F17210 Nicotine dependence, cigarettes, uncomplicated: Secondary | ICD-10-CM | POA: Diagnosis present

## 2024-02-06 DIAGNOSIS — Z79899 Other long term (current) drug therapy: Secondary | ICD-10-CM | POA: Diagnosis not present

## 2024-02-06 DIAGNOSIS — Z7901 Long term (current) use of anticoagulants: Secondary | ICD-10-CM | POA: Diagnosis not present

## 2024-02-06 LAB — CBC WITH DIFFERENTIAL/PLATELET
Abs Immature Granulocytes: 0.04 10*3/uL (ref 0.00–0.07)
Basophils Absolute: 0 10*3/uL (ref 0.0–0.1)
Basophils Relative: 0 %
Eosinophils Absolute: 0 10*3/uL (ref 0.0–0.5)
Eosinophils Relative: 0 %
HCT: 37.1 % (ref 36.0–46.0)
Hemoglobin: 11.2 g/dL — ABNORMAL LOW (ref 12.0–15.0)
Immature Granulocytes: 1 %
Lymphocytes Relative: 9 %
Lymphs Abs: 0.5 10*3/uL — ABNORMAL LOW (ref 0.7–4.0)
MCH: 29 pg (ref 26.0–34.0)
MCHC: 30.2 g/dL (ref 30.0–36.0)
MCV: 96.1 fL (ref 80.0–100.0)
Monocytes Absolute: 0.1 10*3/uL (ref 0.1–1.0)
Monocytes Relative: 2 %
Neutro Abs: 5.1 10*3/uL (ref 1.7–7.7)
Neutrophils Relative %: 88 %
Platelets: 168 10*3/uL (ref 150–400)
RBC: 3.86 MIL/uL — ABNORMAL LOW (ref 3.87–5.11)
RDW: 14 % (ref 11.5–15.5)
WBC: 5.8 10*3/uL (ref 4.0–10.5)
nRBC: 0 % (ref 0.0–0.2)

## 2024-02-06 LAB — BASIC METABOLIC PANEL WITH GFR
Anion gap: 3 — ABNORMAL LOW (ref 5–15)
BUN: 21 mg/dL (ref 8–23)
CO2: 32 mmol/L (ref 22–32)
Calcium: 8.3 mg/dL — ABNORMAL LOW (ref 8.9–10.3)
Chloride: 105 mmol/L (ref 98–111)
Creatinine, Ser: 0.49 mg/dL (ref 0.44–1.00)
GFR, Estimated: >60 mL/min (ref >60)
Glucose, Bld: 125 mg/dL — ABNORMAL HIGH (ref 70–99)
Potassium: 3.7 mmol/L (ref 3.5–5.1)
Sodium: 140 mmol/L (ref 135–145)

## 2024-02-06 LAB — HEPATIC FUNCTION PANEL
ALT: 16 U/L (ref 0–44)
AST: 15 U/L (ref 15–41)
Albumin: 3.3 g/dL — ABNORMAL LOW (ref 3.5–5.0)
Alkaline Phosphatase: 53 U/L (ref 38–126)
Bilirubin, Direct: <0.1 mg/dL (ref 0.0–0.2)
Total Bilirubin: 0.5 mg/dL (ref 0.0–1.2)
Total Protein: 6.2 g/dL — ABNORMAL LOW (ref 6.5–8.1)

## 2024-02-06 LAB — PROCALCITONIN: Procalcitonin: <0.1 ng/mL

## 2024-02-06 MED ORDER — SODIUM CHLORIDE 0.9 % IV SOLN
100.0000 mg | Freq: Two times a day (BID) | INTRAVENOUS | Status: DC
  Administered 2024-02-06 – 2024-02-07 (×3): 100 mg via INTRAVENOUS
  Filled 2024-02-06 (×3): qty 100

## 2024-02-06 MED ORDER — IPRATROPIUM-ALBUTEROL 0.5-2.5 (3) MG/3ML IN SOLN
3.0000 mL | RESPIRATORY_TRACT | Status: DC | PRN
  Administered 2024-02-06: 3 mL via RESPIRATORY_TRACT

## 2024-02-06 MED ORDER — LEVOTHYROXINE SODIUM 125 MCG PO TABS
125.0000 ug | ORAL_TABLET | Freq: Every day | ORAL | Status: DC
  Administered 2024-02-06 – 2024-02-09 (×4): 125 ug via ORAL
  Filled 2024-02-06 (×4): qty 1

## 2024-02-06 MED ORDER — SERTRALINE HCL 25 MG PO TABS
25.0000 mg | ORAL_TABLET | Freq: Every day | ORAL | Status: DC
  Administered 2024-02-06 – 2024-02-09 (×4): 25 mg via ORAL
  Filled 2024-02-06 (×4): qty 1

## 2024-02-06 MED ORDER — ROSUVASTATIN CALCIUM 10 MG PO TABS
20.0000 mg | ORAL_TABLET | Freq: Every day | ORAL | Status: DC
  Administered 2024-02-06 – 2024-02-09 (×4): 20 mg via ORAL
  Filled 2024-02-06 (×4): qty 2

## 2024-02-06 MED ORDER — METOPROLOL SUCCINATE ER 25 MG PO TB24
12.5000 mg | ORAL_TABLET | Freq: Every day | ORAL | Status: DC
  Administered 2024-02-07 – 2024-02-09 (×3): 12.5 mg via ORAL
  Filled 2024-02-06 (×4): qty 1

## 2024-02-06 MED ORDER — POTASSIUM CHLORIDE 20 MEQ PO PACK
40.0000 meq | PACK | Freq: Once | ORAL | Status: AC
  Administered 2024-02-06: 40 meq via ORAL
  Filled 2024-02-06: qty 2

## 2024-02-06 MED ORDER — ORAL CARE MOUTH RINSE: 15.0000 mL | OROMUCOSAL | Status: DC | PRN

## 2024-02-06 MED ORDER — METHYLPREDNISOLONE SODIUM SUCC 40 MG IJ SOLR
40.0000 mg | Freq: Two times a day (BID) | INTRAMUSCULAR | Status: DC
  Administered 2024-02-06 – 2024-02-09 (×8): 40 mg via INTRAVENOUS
  Filled 2024-02-06 (×8): qty 1

## 2024-02-06 MED ORDER — IPRATROPIUM-ALBUTEROL 0.5-2.5 (3) MG/3ML IN SOLN
3.0000 mL | Freq: Four times a day (QID) | RESPIRATORY_TRACT | Status: DC
  Administered 2024-02-07 – 2024-02-09 (×11): 3 mL via RESPIRATORY_TRACT
  Filled 2024-02-06 (×11): qty 3

## 2024-02-06 MED ORDER — MAGNESIUM OXIDE -MG SUPPLEMENT 400 (240 MG) MG PO TABS
400.0000 mg | ORAL_TABLET | Freq: Every day | ORAL | Status: DC
  Administered 2024-02-06 – 2024-02-09 (×4): 400 mg via ORAL
  Filled 2024-02-06 (×4): qty 1

## 2024-02-06 MED ORDER — BUDESONIDE 0.25 MG/2ML IN SUSP
0.2500 mg | Freq: Two times a day (BID) | RESPIRATORY_TRACT | Status: DC
  Administered 2024-02-06 – 2024-02-09 (×7): 0.25 mg via RESPIRATORY_TRACT
  Filled 2024-02-06 (×7): qty 2

## 2024-02-06 MED ORDER — NITROGLYCERIN 0.4 MG SL SUBL: 0.4000 mg | SUBLINGUAL_TABLET | SUBLINGUAL | Status: DC | PRN

## 2024-02-06 MED ORDER — APIXABAN 5 MG PO TABS
5.0000 mg | ORAL_TABLET | Freq: Two times a day (BID) | ORAL | Status: DC
  Administered 2024-02-06 – 2024-02-09 (×7): 5 mg via ORAL
  Filled 2024-02-06 (×8): qty 1

## 2024-02-06 MED ORDER — IPRATROPIUM-ALBUTEROL 0.5-2.5 (3) MG/3ML IN SOLN
3.0000 mL | RESPIRATORY_TRACT | Status: DC
  Administered 2024-02-06 (×4): 3 mL via RESPIRATORY_TRACT
  Filled 2024-02-06 (×5): qty 3

## 2024-02-06 NOTE — Plan of Care (Signed)
  Problem: Health Behavior/Discharge Planning: Goal: Ability to manage health-related needs will improve Outcome: Progressing   Problem: Clinical Measurements: Goal: Respiratory complications will improve Outcome: Progressing   Problem: Nutrition: Goal: Adequate nutrition will be maintained Outcome: Progressing   Problem: Coping: Goal: Level of anxiety will decrease Outcome: Progressing   Problem: Elimination: Goal: Will not experience complications related to bowel motility Outcome: Progressing   Problem: Pain Managment: Goal: General experience of comfort will improve and/or be controlled Outcome: Progressing   Problem: Safety: Goal: Ability to remain free from injury will improve Outcome: Progressing   Problem: Skin Integrity: Goal: Risk for impaired skin integrity will decrease Outcome: Progressing

## 2024-02-06 NOTE — Progress Notes (Signed)
 Tracy Park [phone] [facility] Liaison Note  Received notification from Transitions of Care Manager, of patient's admission and inquiry about our hospice services. We received referral for hospice services with patient at last admission but have been unable to coordinate opening services with contact attempts to daughter. Spoke with patient to initiate education related to hospice philosophy, services, and team approach to care. Patient verbalized understanding of information given.   DME needs discussed. Patient has the Oxygen  and nebulizer from [employer] as well as a walker that is owned, in the home. Patient denies need for other DME at this time.   Plan will be for scheduling a hospice in home nurse visit shortly after discharge once discharge date is know. Hospital liaisons will follow.   Please send signed and completed DNR home with patient/family. Please provide prescriptions at discharge as needed to ensure ongoing symptom management.   [facility] information and contact numbers given to patient. Above information shared with Shelvy Dickens, Transitions of Care Manager. Please call with any questions or concerns.   Thank you for the opportunity to participate in this patient's care.   Madelene Schanz BSN, Charity fundraiser, OCN [employer] [phone]

## 2024-02-06 NOTE — Consult Note (Signed)
 NAME:  Tracy Park, MRN:  [id number], DOB:  1956-01-03, LOS: 0 ADMISSION DATE:  02/05/2024, CONSULTATION DATE: 02/06/2024 REFERRING MD: Dr. Rudine Cos, CHIEF COMPLAINT: COPD exacerbation  History of Present Illness:  68 year old with background history of COPD, active smoker, currently only smoking about 3 cigarettes a day Paroxysmal atrial fibrillation, past history of left upper lobectomy for adenocarcinoma of the lung in 2018, hypertension hyperlipidemia  Came in with worsening shortness of breath No fevers, increased cough with sputum production  Was hospitalized recently for COPD  Usually follows at [facility] Comorbidities include paroxysmal atrial fibrillation, history of obstructive sleep apnea  Pertinent  Medical History   Past Medical History:  Diagnosis Date   Adenocarcinoma of lung (HCC) 02/26/2022   Anginal pain (HCC)    Anxiety    Bipolar disorder (HCC)    Cancer (HCC)    COPD (chronic obstructive pulmonary disease) (HCC)    Dyspnea    Family history of adverse reaction to anesthesia    History of depression 08/03/2023   History of kidney stones    Hydroureteronephrosis 08/16/2021   Hypothyroidism    Lung cancer (HCC)    Malignant neoplasm of upper lobe of left lung (HCC) 11/12/2016   Myocardial infarction (HCC)    Paroxysmal atrial fibrillation (HCC)    Paroxysmal atrial fibrillation with RVR (HCC) 01/25/2023   PTSD (post-traumatic stress disorder)    Rib pain on left side 06/24/2023   Sleep apnea    Thyroid  disease      Significant Hospital Events: Including procedures, antibiotic start and stop dates in addition to other pertinent events   CT chest 01/10/2024-evidence of prior lobectomy, some consolidation in right upper lobe-stable from prior  Interim History / Subjective:  Cough, shortness of breath, wheezy  Objective    Blood pressure 109/70, pulse 81, temperature (!) 97.5 F (36.4 C), resp. rate 18, SpO2 99%.    FiO2 (%):  [28 %] 28  %  No intake or output data in the 24 hours ending 02/06/24 1307 There were no vitals filed for this visit.  Examination: General: Middle-age, chronically ill-appearing HENT: Moist oral mucosa Lungs: Decreased air movement bilaterally with wheezing Cardiovascular: S1-S2 appreciated Abdomen: Soft, bowel sounds appreciated Extremities: No clubbing, no edema Neuro: Alert and oriented x 3 GU:                            I reviewed the CT scan of the chest myself  Reviewed records from [facility]  Resolved problem list   Assessment and Plan   COPD with exacerbation -Bronchodilators, steroids - No significant leukocytosis -Started on doxycycline  - Check procalcitonin  Past history of lung cancer -Post lobectomy -Did have chemotherapy following that - Stable  Abnormal CT showing a right upper lobe infiltrate - Most recent CT shows stability - Needs close follow-up radiologically   -PET scan was ordered for April 2025-I do not see a result for PET scan-this was ordered during her last visit at [facility] secondary concerns with the right upper lobe infiltrate  history of Pseudomonas colonization   Can be taken off antibiotics if procalcitonin is not elevated  Counseled about the need to quit smoking completely  Myer Artis, MD [facility] Pager: See Tilford Foley

## 2024-02-06 NOTE — Progress Notes (Signed)
 PROGRESS NOTE    Tracy Park  UVO:[id number] DOB: 1955-12-17 DOA: 02/05/2024 PCP: Chana Comas, FNP   Brief Narrative:  Tracy Park is a 68 year old woman, daily smoker with COPD on 2L [place] at baseline, OSA, paroxysmal atrial fibrillation on eliquis  and stage 3A left upper lobe lung adenocarcinoma s/p left upper lobectomy 2018, hypertension, hyperlipidemia who was recently admitted to the hospital from 01/29/2024 through 01/31/2024 for COPD exacerbation also had multiple recent admissions for the same presents to the ER. Hospitalist called for admission. Patient still smokes cigarettes.  No interest in quitting.  Assessment & Plan: Principal Problem:   COPD exacerbation (HCC) Active Problems:   Acute on chronic respiratory failure with hypoxia (HCC)   Anxiety and depression   History of lung cancer   Essential hypertension   Paroxysmal atrial fibrillation (HCC)   Chronic respiratory failure with hypoxia (HCC)   Hypothyroidism   Hyperlipidemia   Dyslipidemia   Normocytic anemia   Obstructive sleep apnea (adult) (pediatric)   Hypokalemia  Chronic hypoxia Acute respiratory distress Acute recurrent COPD exacerbation Rule out underlying pneumonia History of lung cancer status post left upper lung lobectomy -Profound tachypnea and respiratory accessory muscle use, improving - Pulmonology consulted, appreciate any insight and recommendations with this difficult case - Continue doxycycline , nebs, supportive care, oxygen (currently at baseline) - Lengthy discussion in regards to smoking cessation  Paroxysmal A-fib, rate controlled - Continue Eliquis , metoprolol   Hypertension -Well-controlled, continue metoprolol    Hypokalemia - Repleted, follow repeat labs  Chronic anemia of chronic disease -Stable, at baseline  Hypothyroidism -Continue levothyroxine    Hyperlipidemia -Continue statin   Chronic depression - Continue sertraline    DVT prophylaxis: apixaban  (ELIQUIS )  tablet 5 mg  Code Status:   Code Status: Limited: Do not attempt resuscitation (DNR) -DNR-LIMITED -Do Not Intubate/DNI  Family Communication: None present  Status is: Inpatient  Dispo: The patient is from: Home              Anticipated d/c is to: Home              Anticipated d/c date is: 72+ hours              Patient currently not medically stable for discharge  Consultants:  Pulmonology  Procedures:  None  Antimicrobials:  Doxycycline   Subjective: No acute issues or events overnight respiratory status improving somewhat but not yet back to baseline  Objective: Vitals:   02/05/24 2238 02/06/24 0027 02/06/24 0344 02/06/24 0501  BP:  117/73  114/69  Pulse:  75  78  Resp:  18  18  Temp: 97.7 F (36.5 C) 97.8 F (36.6 C)  (!) 97.5 F (36.4 C)  SpO2:  97% 96% 100%   No intake or output data in the 24 hours ending 02/06/24 0759 There were no vitals filed for this visit.  Examination:  General:  Pleasantly resting in bed, No acute distress. HEENT:  Normocephalic atraumatic.  Sclerae nonicteric, noninjected.  Extraocular movements intact bilaterally. Neck:  Without mass or deformity.  Trachea is midline. Lungs: Bilateral expiratory wheeze, no accessory muscle use Heart:  Regular rate and rhythm.  Without murmurs, rubs, or gallops. Abdomen:  Soft, nontender, nondistended.  Without guarding or rebound. Extremities: Without cyanosis, clubbing, edema, or obvious deformity. Skin:  Warm and dry, no erythema.   Data Reviewed: I have personally reviewed following labs and imaging studies  CBC: Recent Labs  Lab 02/05/24 1907 02/06/24 0508  WBC 7.7 5.8  NEUTROABS 7.1 5.1  HGB 11.7* 11.2*  HCT 37.9 37.1  MCV 96.4 96.1  PLT 181 168   Basic Metabolic Panel: Recent Labs  Lab 02/05/24 1907 02/06/24 0508  NA 144 140  K 3.3* 3.7  CL 109 105  CO2 26 32  GLUCOSE 167* 125*  BUN 20 21  CREATININE 0.48 0.49  CALCIUM  7.5* 8.3*   GFR: Estimated Creatinine Clearance:  58 mL/min (by C-G formula based on SCr of 0.49 mg/dL). Liver Function Tests: Recent Labs  Lab 02/05/24 1907 02/06/24 0508  AST 13* 15  ALT 14 16  ALKPHOS 50 53  BILITOT 0.4 0.5  PROT 5.5* 6.2*  ALBUMIN 3.0* 3.3*   CBG: Recent Labs  Lab 01/30/24 1139  GLUCAP 108*   Sepsis Labs: Recent Labs  Lab 02/05/24 1933 02/05/24 2052  LATICACIDVEN 1.2 0.9    Recent Results (from the past 240 hours)  Resp panel by RT-PCR (RSV, Flu A&B, Covid) Anterior Nasal Swab     Status: None   Collection Time: 01/29/24  7:24 PM   Specimen: Anterior Nasal Swab  Result Value Ref Range Status   SARS Coronavirus 2 by RT PCR NEGATIVE NEGATIVE Final    Comment: (NOTE) SARS-CoV-2 target nucleic acids are NOT DETECTED.  The SARS-CoV-2 RNA is generally detectable in upper respiratory specimens during the acute phase of infection. The lowest concentration of SARS-CoV-2 viral copies this assay can detect is 138 copies/mL. A negative result does not preclude SARS-Cov-2 infection and should not be used as the sole basis for treatment or other patient management decisions. A negative result may occur with  improper specimen collection/handling, submission of specimen other than nasopharyngeal swab, presence of viral mutation(s) within the areas targeted by this assay, and inadequate number of viral copies(<138 copies/mL). A negative result must be combined with clinical observations, patient history, and epidemiological information. The expected result is Negative.  Fact Sheet for Patients:  BloggerCourse.com  Fact Sheet for Healthcare Providers:  SeriousBroker.it  This test is no t yet approved or cleared by the [place]  FDA and  has been authorized for detection and/or diagnosis of SARS-CoV-2 by FDA under an Emergency Use Authorization (EUA). This EUA will remain  in effect (meaning this test can be used) for the duration of the COVID-19  declaration under Section 564(b)(1) of the Act, 21 U.S.C.section 360bbb-3(b)(1), unless the authorization is terminated  or revoked sooner.       Influenza A by PCR NEGATIVE NEGATIVE Final   Influenza B by PCR NEGATIVE NEGATIVE Final    Comment: (NOTE) The Xpert Xpress SARS-CoV-2/FLU/RSV plus assay is intended as an aid in the diagnosis of influenza from Nasopharyngeal swab specimens and should not be used as a sole basis for treatment. Nasal washings and aspirates are unacceptable for Xpert Xpress SARS-CoV-2/FLU/RSV testing.  Fact Sheet for Patients: BloggerCourse.com  Fact Sheet for Healthcare Providers: SeriousBroker.it  This test is not yet approved or cleared by the [place]  FDA and has been authorized for detection and/or diagnosis of SARS-CoV-2 by FDA under an Emergency Use Authorization (EUA). This EUA will remain in effect (meaning this test can be used) for the duration of the COVID-19 declaration under Section 564(b)(1) of the Act, 21 U.S.C. section 360bbb-3(b)(1), unless the authorization is terminated or revoked.     Resp Syncytial Virus by PCR NEGATIVE NEGATIVE Final    Comment: (NOTE) Fact Sheet for Patients: BloggerCourse.com  Fact Sheet for Healthcare Providers: SeriousBroker.it  This test is not yet approved or cleared by the [place]  States FDA and has been authorized for detection and/or diagnosis of SARS-CoV-2 by FDA under an Emergency Use Authorization (EUA). This EUA will remain in effect (meaning this test can be used) for the duration of the COVID-19 declaration under Section 564(b)(1) of the Act, 21 U.S.C. section 360bbb-3(b)(1), unless the authorization is terminated or revoked.  Performed at [facility], [address] [place], [place] [address]   Respiratory (~20 pathogens) panel by PCR     Status: None   Collection  Time: 01/29/24 11:13 PM   Specimen: Nasopharyngeal Swab; Respiratory  Result Value Ref Range Status   Adenovirus NOT DETECTED NOT DETECTED Final   Coronavirus 229E NOT DETECTED NOT DETECTED Final    Comment: (NOTE) The Coronavirus on the Respiratory Panel, DOES NOT test for the novel  Coronavirus (2019 nCoV)    Coronavirus HKU1 NOT DETECTED NOT DETECTED Final   Coronavirus NL63 NOT DETECTED NOT DETECTED Final   Coronavirus OC43 NOT DETECTED NOT DETECTED Final   Metapneumovirus NOT DETECTED NOT DETECTED Final   Rhinovirus / Enterovirus NOT DETECTED NOT DETECTED Final   Influenza A NOT DETECTED NOT DETECTED Final   Influenza B NOT DETECTED NOT DETECTED Final   Parainfluenza Virus 1 NOT DETECTED NOT DETECTED Final   Parainfluenza Virus 2 NOT DETECTED NOT DETECTED Final   Parainfluenza Virus 3 NOT DETECTED NOT DETECTED Final   Parainfluenza Virus 4 NOT DETECTED NOT DETECTED Final   Respiratory Syncytial Virus NOT DETECTED NOT DETECTED Final   Bordetella pertussis NOT DETECTED NOT DETECTED Final   Bordetella Parapertussis NOT DETECTED NOT DETECTED Final   Chlamydophila pneumoniae NOT DETECTED NOT DETECTED Final   Mycoplasma pneumoniae NOT DETECTED NOT DETECTED Final    Comment: Performed at [facility], [address] [place], [place] [address]         Radiology Studies: DG Chest Port 1 View Result Date: 02/05/2024 CLINICAL DATA:  Shortness of breath for 1 month EXAM: PORTABLE CHEST 1 VIEW COMPARISON:  01/29/2024 FINDINGS: No change from 01/29/2024. Emphysema. Pleural-parenchymal opacities with volume loss and scarring in the left apex. The right lung is clear. Postoperative change about the left upper lobe. Chronic pleural thickening or small pleural effusion in left lung. No pneumothorax. Cardiomediastinal silhouette within normal limits. IMPRESSION: No change from 01/29/2024. Emphysema with pleural-parenchymal opacities and volume loss/scarring in the left apex. Electronically  Signed   By: Rozell Cornet M.D.   On: 02/05/2024 19:35   Scheduled Meds:  apixaban   5 mg Oral BID   budesonide  (PULMICORT ) nebulizer solution  0.25 mg Nebulization BID   ipratropium-albuterol   3 mL Nebulization Q4H   levothyroxine   125 mcg Oral Q0600   magnesium  oxide  400 mg Oral Daily   methylPREDNISolone  (SOLU-MEDROL ) injection  40 mg Intravenous Q12H   metoprolol  succinate  12.5 mg Oral Daily   rosuvastatin   20 mg Oral Daily   sertraline   25 mg Oral Daily   Continuous Infusions:  doxycycline  (VIBRAMYCIN ) IV 100 mg (02/06/24 0152)     LOS: 0 days   Time spent:  Haydee Lipa, DO [facility]  If 7PM-7AM, please contact night-coverage [email or web address]  02/06/2024, 7:59 AM

## 2024-02-06 NOTE — Care Management Obs Status (Signed)
 MEDICARE OBSERVATION STATUS NOTIFICATION   Patient Details  Name: VIOLETTA LAVALLE MRN: [id number] Date of Birth: 08/05/1956   Medicare Observation Status Notification Given:  Yes    Tessie Fila, RN 02/06/2024, 11:04 AM

## 2024-02-06 NOTE — Plan of Care (Signed)

## 2024-02-06 NOTE — TOC Initial Note (Signed)
 Transition of Care [facility]) - Initial/Assessment Note    Patient Details  Name: Tracy Park MRN: [id number] Date of Birth: 1956/01/26  Transition of Care [facility]) CM/SW Contact:    Tessie Fila, RN Phone Number: 02/06/2024, 1:29 PM  Clinical Narrative:                 NCM met with pt at bedside and she states that she currently uses 2L O2 [place] at baseline. [employer] supplies pt O2 needs. Pt has travel tank in room at bedside. Pt denies any HH or DME needs at this time. Pt has hospice consult. Shawn with [facility] met with pt, and confirms they will offer hospice services at discharge. TOC will continue to follow for any DC needs.  Expected Discharge Plan: Home w Hospice Care Barriers to Discharge: Continued Medical Work up   Patient Goals and CMS Choice Patient states their goals for this hospitalization and ongoing recovery are:: To return home CMS Medicare.gov Compare Post Acute Care list provided to:: Other (Comment Required) (NA) Choice offered to / list presented to : NA [place] ownership interest in [facility].provided to:: Parent NA    Expected Discharge Plan and Services In-house Referral: Hospice / Palliative Care Discharge Planning Services: CM Consult Post Acute Care Choice: Hospice Living arrangements for the past 2 months: Single Family Home                 DME Arranged: N/A DME Agency: NA       HH Arranged: NA HH Agency: Other - See comment Photographer) Date HH Agency Contacted: 02/06/24 Time HH Agency Contacted: 1327 Representative spoke with at [facility] Agency: Madelene Schanz, RN- with Civil engineer, contracting  Prior Living Arrangements/Services Living arrangements for the past 2 months: Single Family Home Lives with:: Adult Children Patient language and need for interpreter reviewed:: Yes Do you feel safe going back to the place where you live?: Yes      Need for Family Participation in Patient Care: No (Comment) Care giver support system in  place?: Yes (comment) Current home services: DME, Other (comment) (Oxygen ) Criminal Activity/Legal Involvement Pertinent to Current Situation/Hospitalization: No - Comment as needed  Activities of Daily Living      Permission Sought/Granted Permission sought to share information with : Family Supports Permission granted to share information with : Yes, Verbal Permission Granted  Share Information with NAME: Malvina Searle (Daughter)  [phone]           Emotional Assessment Appearance:: Appears older than stated age Attitude/Demeanor/Rapport: Engaged Affect (typically observed): Accepting Orientation: : Oriented to Self, Oriented to Place, Oriented to  Time, Oriented to Situation Alcohol  / Substance Use: Not Applicable Psych Involvement: No (comment)  Admission diagnosis:  COPD exacerbation (HCC) [J44.1] Patient Active Problem List   Diagnosis Date Noted   Hypokalemia 02/06/2024   Factitious disorder with predominantly psychological signs and symptoms 01/30/2024   Palliative care encounter 01/30/2024   Need for emotional support 01/30/2024   Counseling and coordination of care 01/30/2024   Goals of care, counseling/discussion 01/30/2024   COPD exacerbation (HCC) 01/30/2024   Chronic respiratory failure with hypoxia (HCC) 12/13/2023   COPD with acute exacerbation 09/22/2023   Paroxysmal atrial fibrillation (HCC) 02/23/2023   Pulmonary nodule 01/25/2023   COPD (chronic obstructive pulmonary disease) (HCC) 01/25/2023   Essential hypertension 01/25/2023   DDD (degenerative disc disease), cervical 03/24/2022   Bipolar disorder (HCC) 03/24/2022   Dyslipidemia 03/13/2022   Anxiety and depression 03/13/2022  History of lung cancer 03/13/2022   Acute on chronic respiratory failure with hypoxia (HCC) 02/26/2022   Normocytic anemia 02/08/2022   Malnutrition of moderate degree 08/25/2021   Coronary artery disease involving native coronary artery of native heart without angina  pectoris 08/18/2018   Chronic bilateral low back pain without sciatica 08/21/2016   Obstructive sleep apnea (adult) (pediatric) 02/22/2015   Hyperlipidemia 06/03/2013   Vitamin D  deficiency 06/03/2013   Hypothyroidism 04/28/2012   Tobacco use 04/28/2012   PCP:  Chana Comas, FNP Pharmacy:   [facility] [phone] Jonette Nestle, [place] - [address] [place] [address] Phone: [phone] Fax: [phone]  [place] [facility] [address] [place] [address] Phone: [phone] Fax: [phone]     Social Drivers of Health (SDOH) Social History: SDOH Screenings   Food Insecurity: No Food Insecurity (01/29/2024)  Housing: Low Risk  (01/29/2024)  Transportation Needs: No Transportation Needs (01/29/2024)  Utilities: Not At Risk (01/29/2024)  Financial Resource Strain: High Risk (10/08/2023)   Received from [facility]  Physical Activity: Unknown (04/07/2023)   [facility] from [facility]  Social Connections: Moderately Integrated (01/29/2024)  Recent Concern: Social Connections - Socially Isolated (12/30/2023)  Stress: Stress Concern Present (04/07/2023)   Received from [facility]  Tobacco Use: High Risk (02/06/2024)   SDOH Interventions:     Readmission Risk Interventions    01/14/2024    2:11 PM 01/05/2024   12:08 PM 12/30/2023   11:48 AM  Readmission Risk Prevention Plan  Transportation Screening Complete Complete Complete  Medication Review Oceanographer) Complete Complete Complete  PCP or Specialist appointment within 3-5 days of discharge Complete Complete Complete  HRI or Home Care Consult -- Complete Complete  SW Recovery Care/Counseling Consult -- Complete Complete  Palliative Care Screening Not Applicable Not Applicable Not Applicable  Skilled Nursing Facility Not Applicable Not Applicable Not Applicable

## 2024-02-07 ENCOUNTER — Other Ambulatory Visit: Payer: Self-pay

## 2024-02-07 DIAGNOSIS — J441 Chronic obstructive pulmonary disease with (acute) exacerbation: Secondary | ICD-10-CM | POA: Diagnosis not present

## 2024-02-07 MED ORDER — LIDOCAINE 5 % EX PTCH
1.0000 | MEDICATED_PATCH | Freq: Once | CUTANEOUS | Status: AC
  Administered 2024-02-07: 1 via TRANSDERMAL
  Filled 2024-02-07: qty 1

## 2024-02-07 MED ORDER — ACETAMINOPHEN 325 MG PO TABS
325.0000 mg | ORAL_TABLET | Freq: Four times a day (QID) | ORAL | Status: DC | PRN
  Administered 2024-02-07 – 2024-02-09 (×3): 325 mg via ORAL
  Filled 2024-02-07 (×3): qty 1

## 2024-02-07 MED ORDER — KETOROLAC TROMETHAMINE 15 MG/ML IJ SOLN
15.0000 mg | Freq: Once | INTRAMUSCULAR | Status: AC
  Administered 2024-02-07: 15 mg via INTRAVENOUS
  Filled 2024-02-07: qty 1

## 2024-02-07 MED ORDER — IBUPROFEN 200 MG PO TABS
400.0000 mg | ORAL_TABLET | ORAL | Status: DC | PRN
  Administered 2024-02-07 – 2024-02-09 (×5): 400 mg via ORAL
  Filled 2024-02-07 (×5): qty 2

## 2024-02-07 NOTE — Progress Notes (Signed)
 PROGRESS NOTE    Tracy Park  UJW:[id number] DOB: Sep 08, 1956 DOA: 02/05/2024 PCP: Chana Comas, FNP   Brief Narrative:  Tracy Park is a 68 year old woman, daily smoker with COPD on 2L Walsh at baseline, OSA, paroxysmal atrial fibrillation on eliquis  and stage 3A left upper lobe lung adenocarcinoma s/p left upper lobectomy 2018, hypertension, hyperlipidemia who was recently admitted to the hospital from 01/29/2024 through 01/31/2024 for COPD exacerbation also had multiple recent admissions for the same presents to the ER. Hospitalist called for admission. Patient still smokes cigarettes.  No interest in quitting.  Assessment & Plan: Principal Problem:   COPD exacerbation (HCC) Active Problems:   Acute on chronic respiratory failure with hypoxia (HCC)   Anxiety and depression   History of lung cancer   Essential hypertension   Paroxysmal atrial fibrillation (HCC)   Chronic respiratory failure with hypoxia (HCC)   Hypothyroidism   Hyperlipidemia   Dyslipidemia   Normocytic anemia   Obstructive sleep apnea (adult) (pediatric)   Hypokalemia  Chronic hypoxia Acute respiratory distress Acute recurrent COPD exacerbation Rule out underlying pneumonia History of lung cancer status post left upper lung lobectomy -Profound tachypnea and respiratory accessory muscle use, improving - Pulmonology consulted, appreciate any insight and recommendations with this difficult case - Procalcitonin negative, discontinue doxycycline  - Continue nebs, supportive care, oxygen (currently at baseline) - Lengthy discussion in regards to smoking cessation  History of factitious disorder with predominantly psychological signs and symptoms -Overnight patient reports "twisting" her back without fall/injury and "cannot walk" - will have PT evaluate. No indication for imaging/evaluation and will avoid narcotics given concern for seeking behavior.  Lidocaine  patch, heating pad/ice pack as tolerated -Noted at  last hospitalization "01-30-2024 pt claming that she is feeling 'too weak' and  'unable to walk'.  Claims she 'only walked a few fee'" today.  Verified with mobility tech that pt walked 460 feet today"   Paroxysmal A-fib, rate controlled - Continue Eliquis , metoprolol   Hypertension -Well-controlled, continue metoprolol    Hypokalemia - Repleted, follow repeat labs  Chronic anemia of chronic disease -Stable, at baseline  Hypothyroidism -Continue levothyroxine    Hyperlipidemia -Continue statin   Chronic depression - Continue sertraline    DVT prophylaxis: apixaban  (ELIQUIS ) tablet 5 mg  Code Status:   Code Status: Limited: Do not attempt resuscitation (DNR) -DNR-LIMITED -Do Not Intubate/DNI  Family Communication: None present  Status is: Inpatient  Dispo: The patient is from: Home              Anticipated d/c is to: Home              Anticipated d/c date is: 24-48 hours              Patient currently not medically stable for discharge  Consultants:  Pulmonology  Procedures:  None  Antimicrobials:  Doxycycline   Subjective: No acute issues or events overnight respiratory status improving somewhat but not yet back to baseline  Objective: Vitals:   02/07/24 0200 02/07/24 0325 02/07/24 0745 02/07/24 0750  BP:  113/75    Pulse:  83    Resp: (!) 21 17    Temp:  97.9 F (36.6 C)    TempSrc:      SpO2:  100% 99% 99%    Intake/Output Summary (Last 24 hours) at 02/07/2024 5621 Last data filed at 02/06/2024 1500 Gross per 24 hour  Intake 250 ml  Output --  Net 250 ml   There were no vitals filed for this visit.  Examination:  General:  Pleasantly resting in bed, No acute distress. HEENT:  Normocephalic atraumatic.  Sclerae nonicteric, noninjected.  Extraocular movements intact bilaterally. Neck:  Without mass or deformity.  Trachea is midline. Lungs: Bilateral expiratory wheeze, no accessory muscle use Heart:  Regular rate and rhythm.  Without murmurs, rubs,  or gallops. Abdomen:  Soft, nontender, nondistended.  Without guarding or rebound. Extremities: Without cyanosis, clubbing, edema, or obvious deformity. Skin:  Warm and dry, no erythema.   Data Reviewed: I have personally reviewed following labs and imaging studies  CBC: Recent Labs  Lab 02/05/24 1907 02/06/24 0508  WBC 7.7 5.8  NEUTROABS 7.1 5.1  HGB 11.7* 11.2*  HCT 37.9 37.1  MCV 96.4 96.1  PLT 181 168   Basic Metabolic Panel: Recent Labs  Lab 02/05/24 1907 02/06/24 0508  NA 144 140  K 3.3* 3.7  CL 109 105  CO2 26 32  GLUCOSE 167* 125*  BUN 20 21  CREATININE 0.48 0.49  CALCIUM  7.5* 8.3*   GFR: Estimated Creatinine Clearance: 58 mL/min (by C-G formula based on SCr of 0.49 mg/dL). Liver Function Tests: Recent Labs  Lab 02/05/24 1907 02/06/24 0508  AST 13* 15  ALT 14 16  ALKPHOS 50 53  BILITOT 0.4 0.5  PROT 5.5* 6.2*  ALBUMIN 3.0* 3.3*   CBG: No results for input(s): "GLUCAP" in the last 168 hours.  Sepsis Labs: Recent Labs  Lab 02/05/24 1933 02/05/24 2052 02/06/24 1342  PROCALCITON  --   --  <0.10  LATICACIDVEN 1.2 0.9  --     Recent Results (from the past 240 hours)  Resp panel by RT-PCR (RSV, Flu A&B, Covid) Anterior Nasal Swab     Status: None   Collection Time: 01/29/24  7:24 PM   Specimen: Anterior Nasal Swab  Result Value Ref Range Status   SARS Coronavirus 2 by RT PCR NEGATIVE NEGATIVE Final    Comment: (NOTE) SARS-CoV-2 target nucleic acids are NOT DETECTED.  The SARS-CoV-2 RNA is generally detectable in upper respiratory specimens during the acute phase of infection. The lowest concentration of SARS-CoV-2 viral copies this assay can detect is 138 copies/mL. A negative result does not preclude SARS-Cov-2 infection and should not be used as the sole basis for treatment or other patient management decisions. A negative result may occur with  improper specimen collection/handling, submission of specimen other than nasopharyngeal swab,  presence of viral mutation(s) within the areas targeted by this assay, and inadequate number of viral copies(<138 copies/mL). A negative result must be combined with clinical observations, patient history, and epidemiological information. The expected result is Negative.  Fact Sheet for Patients:  BloggerCourse.com  Fact Sheet for Healthcare Providers:  SeriousBroker.it  This test is no t yet approved or cleared by the [place]  FDA and  has been authorized for detection and/or diagnosis of SARS-CoV-2 by FDA under an Emergency Use Authorization (EUA). This EUA will remain  in effect (meaning this test can be used) for the duration of the COVID-19 declaration under Section 564(b)(1) of the Act, 21 U.S.C.section 360bbb-3(b)(1), unless the authorization is terminated  or revoked sooner.       Influenza A by PCR NEGATIVE NEGATIVE Final   Influenza B by PCR NEGATIVE NEGATIVE Final    Comment: (NOTE) The Xpert Xpress SARS-CoV-2/FLU/RSV plus assay is intended as an aid in the diagnosis of influenza from Nasopharyngeal swab specimens and should not be used as a sole basis for treatment. Nasal washings and aspirates are unacceptable for Xpert  Xpress SARS-CoV-2/FLU/RSV testing.  Fact Sheet for Patients: BloggerCourse.com  Fact Sheet for Healthcare Providers: SeriousBroker.it  This test is not yet approved or cleared by the [place]  FDA and has been authorized for detection and/or diagnosis of SARS-CoV-2 by FDA under an Emergency Use Authorization (EUA). This EUA will remain in effect (meaning this test can be used) for the duration of the COVID-19 declaration under Section 564(b)(1) of the Act, 21 U.S.C. section 360bbb-3(b)(1), unless the authorization is terminated or revoked.     Resp Syncytial Virus by PCR NEGATIVE NEGATIVE Final    Comment: (NOTE) Fact Sheet for  Patients: BloggerCourse.com  Fact Sheet for Healthcare Providers: SeriousBroker.it  This test is not yet approved or cleared by the [place]  FDA and has been authorized for detection and/or diagnosis of SARS-CoV-2 by FDA under an Emergency Use Authorization (EUA). This EUA will remain in effect (meaning this test can be used) for the duration of the COVID-19 declaration under Section 564(b)(1) of the Act, 21 U.S.C. section 360bbb-3(b)(1), unless the authorization is terminated or revoked.  Performed at [facility], [address], [place], [place] [address]   Respiratory (~20 pathogens) panel by PCR     Status: None   Collection Time: 01/29/24 11:13 PM   Specimen: Nasopharyngeal Swab; Respiratory  Result Value Ref Range Status   Adenovirus NOT DETECTED NOT DETECTED Final   Coronavirus 229E NOT DETECTED NOT DETECTED Final    Comment: (NOTE) The Coronavirus on the Respiratory Panel, DOES NOT test for the novel  Coronavirus (2019 nCoV)    Coronavirus HKU1 NOT DETECTED NOT DETECTED Final   Coronavirus NL63 NOT DETECTED NOT DETECTED Final   Coronavirus OC43 NOT DETECTED NOT DETECTED Final   Metapneumovirus NOT DETECTED NOT DETECTED Final   Rhinovirus / Enterovirus NOT DETECTED NOT DETECTED Final   Influenza A NOT DETECTED NOT DETECTED Final   Influenza B NOT DETECTED NOT DETECTED Final   Parainfluenza Virus 1 NOT DETECTED NOT DETECTED Final   Parainfluenza Virus 2 NOT DETECTED NOT DETECTED Final   Parainfluenza Virus 3 NOT DETECTED NOT DETECTED Final   Parainfluenza Virus 4 NOT DETECTED NOT DETECTED Final   Respiratory Syncytial Virus NOT DETECTED NOT DETECTED Final   Bordetella pertussis NOT DETECTED NOT DETECTED Final   Bordetella Parapertussis NOT DETECTED NOT DETECTED Final   Chlamydophila pneumoniae NOT DETECTED NOT DETECTED Final   Mycoplasma pneumoniae NOT DETECTED NOT DETECTED Final    Comment:  Performed at [facility], [address] [place], [place] [address]         Radiology Studies: DG Chest Port 1 View Result Date: 02/05/2024 CLINICAL DATA:  Shortness of breath for 1 month EXAM: PORTABLE CHEST 1 VIEW COMPARISON:  01/29/2024 FINDINGS: No change from 01/29/2024. Emphysema. Pleural-parenchymal opacities with volume loss and scarring in the left apex. The right lung is clear. Postoperative change about the left upper lobe. Chronic pleural thickening or small pleural effusion in left lung. No pneumothorax. Cardiomediastinal silhouette within normal limits. IMPRESSION: No change from 01/29/2024. Emphysema with pleural-parenchymal opacities and volume loss/scarring in the left apex. Electronically Signed   By: Rozell Cornet M.D.   On: 02/05/2024 19:35   Scheduled Meds:  apixaban   5 mg Oral BID   budesonide  (PULMICORT ) nebulizer solution  0.25 mg Nebulization BID   ipratropium-albuterol   3 mL Nebulization Q6H   levothyroxine   125 mcg Oral Q0600   lidocaine   1 patch Transdermal Once   magnesium  oxide  400 mg Oral  Daily   methylPREDNISolone  (SOLU-MEDROL ) injection  40 mg Intravenous Q12H   metoprolol  succinate  12.5 mg Oral Daily   rosuvastatin   20 mg Oral Daily   sertraline   25 mg Oral Daily   Continuous Infusions:  doxycycline  (VIBRAMYCIN ) IV 100 mg (02/07/24 0133)     LOS: 1 day   Time spent:  Haydee Lipa, DO [facility]  If 7PM-7AM, please contact night-coverage [email or web address]  02/07/2024, 8:06 AM

## 2024-02-07 NOTE — Progress Notes (Signed)
   02/07/24 0200  BiPAP/CPAP/SIPAP  Reason BIPAP/CPAP not in use Non-compliant (pt does not wear cpap)  BiPAP/CPAP /SiPAP Vitals  Resp (!) 21  MEWS Score/Color  MEWS Score 1  MEWS Score Color Marrie Sizer

## 2024-02-07 NOTE — Progress Notes (Signed)
   02/07/24 2308  BiPAP/CPAP/SIPAP  Reason BIPAP/CPAP not in use Non-compliant (PATIENT REFUSES. NOT IN ROOM)  BiPAP/CPAP /SiPAP Vitals  Resp 17  MEWS Score/Color  MEWS Score 0  MEWS Score Color Tracy Park

## 2024-02-07 NOTE — Progress Notes (Signed)
 Mobility Specialist Cancellation/Refusal Note:   Pt declined mobility at this time stating she cannot get up and walk d/t injuring her leg in the bathroom. Will check back as schedule permits.    [facility]

## 2024-02-07 NOTE — Plan of Care (Signed)
  Problem: Health Behavior/Discharge Planning: Goal: Ability to manage health-related needs will improve Outcome: Progressing   Problem: Clinical Measurements: Goal: Will remain free from infection Outcome: Progressing Goal: Respiratory complications will improve Outcome: Progressing Goal: Cardiovascular complication will be avoided Outcome: Progressing   Problem: Nutrition: Goal: Adequate nutrition will be maintained Outcome: Progressing   Problem: Elimination: Goal: Will not experience complications related to bowel motility Outcome: Progressing   Problem: Pain Managment: Goal: General experience of comfort will improve and/or be controlled Outcome: Progressing   Problem: Safety: Goal: Ability to remain free from injury will improve Outcome: Progressing

## 2024-02-07 NOTE — Progress Notes (Signed)
   NAME:  Tracy Park, MRN:  [id number], DOB:  1955/09/22, LOS: 1 ADMISSION DATE:  02/05/2024, CONSULTATION DATE: 02/06/2024 REFERRING MD: Dr. Rudine Cos, CHIEF COMPLAINT: COPD exacerbation  History of Present Illness:  68 year old with background history of COPD, active smoker, currently only smoking about 3 cigarettes a day Paroxysmal atrial fibrillation, past history of left upper lobectomy for adenocarcinoma of the lung in 2018, hypertension hyperlipidemia   Came in with worsening shortness of breath No fevers, increased cough with sputum production   Was hospitalized recently for COPD   Usually follows at [facility] Comorbidities include paroxysmal atrial fibrillation, history of obstructive sleep apnea  Pertinent  Medical History   Past Medical History:  Diagnosis Date   Adenocarcinoma of lung (HCC) 02/26/2022   Anginal pain (HCC)    Anxiety    Bipolar disorder (HCC)    Cancer (HCC)    COPD (chronic obstructive pulmonary disease) (HCC)    Dyspnea    Family history of adverse reaction to anesthesia    History of depression 08/03/2023   History of kidney stones    Hydroureteronephrosis 08/16/2021   Hypothyroidism    Lung cancer (HCC)    Malignant neoplasm of upper lobe of left lung (HCC) 11/12/2016   Myocardial infarction (HCC)    Paroxysmal atrial fibrillation (HCC)    Paroxysmal atrial fibrillation with RVR (HCC) 01/25/2023   PTSD (post-traumatic stress disorder)    Rib pain on left side 06/24/2023   Sleep apnea    Thyroid  disease     Significant Hospital Events: Including procedures, antibiotic start and stop dates in addition to other pertinent events   CT chest 01/10/2024-evidence of prior lobectomy, some consolidation in right upper lobe stable from prior  Interim History / Subjective:  Cough, wheezy Back pain from pulling back muscles as she was trying to get up from the commode - Pain management measures ongoing  Objective    Blood pressure  113/75, pulse 83, temperature 97.9 F (36.6 C), resp. rate 17, SpO2 99%.    FiO2 (%):  [28 %] 28 %   Intake/Output Summary (Last 24 hours) at 02/07/2024 1038 Last data filed at 02/07/2024 0850 Gross per 24 hour  Intake 490 ml  Output --  Net 490 ml   There were no vitals filed for this visit.  Examination: General: Middle-age, chronically ill-appearing HENT: Moist oral mucosa Lungs: Decreased air movement bilaterally with wheezing Cardiovascular: S1-S2 appreciated Abdomen: Soft, bowel sounds appreciated Extremities: No clubbing, no edema Neuro: Alert and oriented x 3 GU:   Resolved problem list   Assessment and Plan  COPD with exacerbation - Continue bronchodilators, steroids - No significant leukocytosis - Started on doxycycline  -Procalcitonin less than 0.1  Past history of lung cancer - Post lobectomy, did have chemotherapy following that - This appears stable  Abnormal CT showing a right upper lobe infiltrate - Most recent CT shows stability, being followed by pulmonologist at [facility] - PET scan was ordered-I do not see results of this in our record - This needs to be followed up  History of Pseudomonas colonization  Counseled about the need to quit smoking  Discussed with Dr. Rudine Cos  Will sign off

## 2024-02-08 DIAGNOSIS — J441 Chronic obstructive pulmonary disease with (acute) exacerbation: Secondary | ICD-10-CM | POA: Diagnosis not present

## 2024-02-08 LAB — CBC
HCT: 36.8 % (ref 36.0–46.0)
Hemoglobin: 11.5 g/dL — ABNORMAL LOW (ref 12.0–15.0)
MCH: 29.1 pg (ref 26.0–34.0)
MCHC: 31.3 g/dL (ref 30.0–36.0)
MCV: 93.2 fL (ref 80.0–100.0)
Platelets: 189 10*3/uL (ref 150–400)
RBC: 3.95 MIL/uL (ref 3.87–5.11)
RDW: 13.6 % (ref 11.5–15.5)
WBC: 8.3 10*3/uL (ref 4.0–10.5)
nRBC: 0 % (ref 0.0–0.2)

## 2024-02-08 MED ORDER — METHOCARBAMOL 500 MG PO TABS
500.0000 mg | ORAL_TABLET | Freq: Once | ORAL | Status: AC
  Administered 2024-02-08: 500 mg via ORAL
  Filled 2024-02-08: qty 1

## 2024-02-08 MED ORDER — LIDOCAINE 5 % EX PTCH
2.0000 | MEDICATED_PATCH | CUTANEOUS | Status: DC
  Administered 2024-02-08: 2 via TRANSDERMAL
  Filled 2024-02-08 (×3): qty 2

## 2024-02-08 NOTE — Progress Notes (Signed)
 PROGRESS NOTE    Tracy Park  AOZ:[id number] DOB: 10/02/1955 DOA: 02/05/2024 PCP: Chana Comas, FNP   Brief Narrative:  Tracy Park is a 68 year old woman, daily smoker with COPD on 2L [place] at baseline, OSA, paroxysmal atrial fibrillation on eliquis  and stage 3A left upper lobe lung adenocarcinoma s/p left upper lobectomy 2018, hypertension, hyperlipidemia who was recently admitted to the hospital from 01/29/2024 through 01/31/2024 for COPD exacerbation also had multiple recent admissions for the same presents to the ER. Hospitalist called for admission. Patient still smokes cigarettes. No interest in quitting.  Assessment & Plan: Principal Problem:   COPD exacerbation (HCC) Active Problems:   Acute on chronic respiratory failure with hypoxia (HCC)   Anxiety and depression   History of lung cancer   Essential hypertension   Paroxysmal atrial fibrillation (HCC)   Chronic respiratory failure with hypoxia (HCC)   Hypothyroidism   Hyperlipidemia   Dyslipidemia   Normocytic anemia   Obstructive sleep apnea (adult) (pediatric)   Hypokalemia  Chronic hypoxia Acute respiratory distress Acute recurrent COPD exacerbation Rule out underlying pneumonia History of lung cancer status post left upper lung lobectomy -Profound tachypnea and respiratory accessory muscle use, improving - Pulmonology consulted, appreciate any insight and recommendations with this difficult case - Procalcitonin negative, discontinue doxycycline  - Continue nebs, supportive care, oxygen (currently at baseline) - Lengthy discussion in regards to smoking cessation  History of factitious disorder with predominantly psychological signs and symptoms -Overnight 5/30 patient reports "twisting" her back without fall/injury and "cannot walk" - will have PT evaluate.  -Patient has been ambulating to the restroom independently since injury without difficulty -Similar episode during last hospitalization with reported  'inability to walk' but notes 400+ feet of ambulation without issues  Paroxysmal A-fib, rate controlled - Continue Eliquis , metoprolol   Hypertension -Well-controlled, continue metoprolol    Hypokalemia - Repleted, follow repeat labs  Chronic anemia of chronic disease -Stable, at baseline  Hypothyroidism -Continue levothyroxine    Hyperlipidemia -Continue statin   Chronic depression - Continue sertraline    DVT prophylaxis: apixaban  (ELIQUIS ) tablet 5 mg  Code Status:   Code Status: Limited: Do not attempt resuscitation (DNR) -DNR-LIMITED -Do Not Intubate/DNI  Family Communication: None present  Status is: Inpatient  Dispo: The patient is from: Home              Anticipated d/c is to: Home              Anticipated d/c date is: 24-48 hours              Patient currently not medically stable for discharge  Consultants:  Pulmonology  Procedures:  None  Antimicrobials:  Doxycycline   Subjective: No acute issues or events overnight respiratory status improving somewhat but not yet back to baseline  Objective: Vitals:   02/08/24 0045 02/08/24 0046 02/08/24 0453 02/08/24 0753  BP:   135/88   Pulse:   72   Resp:   18   Temp:   97.8 F (36.6 C)   TempSrc:      SpO2:   99% 98%  Weight:  52.5 kg    Height: 5\' 3"  (1.6 m)       Intake/Output Summary (Last 24 hours) at 02/08/2024 0829 Last data filed at 02/08/2024 0700 Gross per 24 hour  Intake 1040 ml  Output --  Net 1040 ml   Filed Weights   02/08/24 0046  Weight: 52.5 kg    Examination:  General:  Pleasantly resting in bed, No acute  distress. HEENT:  Normocephalic atraumatic.  Sclerae nonicteric, noninjected.  Extraocular movements intact bilaterally. Neck:  Without mass or deformity.  Trachea is midline. Lungs: Wheeze L sided only today, without accessory muscle use Heart:  Regular rate and rhythm.  Without murmurs, rubs, or gallops. Abdomen:  Soft, nontender, nondistended.  Without guarding or  rebound. Extremities: Without cyanosis, clubbing, edema, or obvious deformity. Skin:  Warm and dry, no erythema.   Data Reviewed: I have personally reviewed following labs and imaging studies  CBC: Recent Labs  Lab 02/05/24 1907 02/06/24 0508  WBC 7.7 5.8  NEUTROABS 7.1 5.1  HGB 11.7* 11.2*  HCT 37.9 37.1  MCV 96.4 96.1  PLT 181 168   Basic Metabolic Panel: Recent Labs  Lab 02/05/24 1907 02/06/24 0508  NA 144 140  K 3.3* 3.7  CL 109 105  CO2 26 32  GLUCOSE 167* 125*  BUN 20 21  CREATININE 0.48 0.49  CALCIUM  7.5* 8.3*   GFR: Estimated Creatinine Clearance: 58 mL/min (by C-G formula based on SCr of 0.49 mg/dL). Liver Function Tests: Recent Labs  Lab 02/05/24 1907 02/06/24 0508  AST 13* 15  ALT 14 16  ALKPHOS 50 53  BILITOT 0.4 0.5  PROT 5.5* 6.2*  ALBUMIN 3.0* 3.3*   CBG: No results for input(s): "GLUCAP" in the last 168 hours.  Sepsis Labs: Recent Labs  Lab 02/05/24 1933 02/05/24 2052 02/06/24 1342  PROCALCITON  --   --  <0.10  LATICACIDVEN 1.2 0.9  --     Recent Results (from the past 240 hours)  Resp panel by RT-PCR (RSV, Flu A&B, Covid) Anterior Nasal Swab     Status: None   Collection Time: 01/29/24  7:24 PM   Specimen: Anterior Nasal Swab  Result Value Ref Range Status   SARS Coronavirus 2 by RT PCR NEGATIVE NEGATIVE Final    Comment: (NOTE) SARS-CoV-2 target nucleic acids are NOT DETECTED.  The SARS-CoV-2 RNA is generally detectable in upper respiratory specimens during the acute phase of infection. The lowest concentration of SARS-CoV-2 viral copies this assay can detect is 138 copies/mL. A negative result does not preclude SARS-Cov-2 infection and should not be used as the sole basis for treatment or other patient management decisions. A negative result may occur with  improper specimen collection/handling, submission of specimen other than nasopharyngeal swab, presence of viral mutation(s) within the areas targeted by this assay,  and inadequate number of viral copies(<138 copies/mL). A negative result must be combined with clinical observations, patient history, and epidemiological information. The expected result is Negative.  Fact Sheet for Patients:  BloggerCourse.com  Fact Sheet for Healthcare Providers:  SeriousBroker.it  This test is no t yet approved or cleared by the [place]  FDA and  has been authorized for detection and/or diagnosis of SARS-CoV-2 by FDA under an Emergency Use Authorization (EUA). This EUA will remain  in effect (meaning this test can be used) for the duration of the COVID-19 declaration under Section 564(b)(1) of the Act, 21 U.S.C.section 360bbb-3(b)(1), unless the authorization is terminated  or revoked sooner.       Influenza A by PCR NEGATIVE NEGATIVE Final   Influenza B by PCR NEGATIVE NEGATIVE Final    Comment: (NOTE) The Xpert Xpress SARS-CoV-2/FLU/RSV plus assay is intended as an aid in the diagnosis of influenza from Nasopharyngeal swab specimens and should not be used as a sole basis for treatment. Nasal washings and aspirates are unacceptable for Xpert Xpress SARS-CoV-2/FLU/RSV testing.  Fact Sheet for Patients:  BloggerCourse.com  Fact Sheet for Healthcare Providers: SeriousBroker.it  This test is not yet approved or cleared by the [place]  FDA and has been authorized for detection and/or diagnosis of SARS-CoV-2 by FDA under an Emergency Use Authorization (EUA). This EUA will remain in effect (meaning this test can be used) for the duration of the COVID-19 declaration under Section 564(b)(1) of the Act, 21 U.S.C. section 360bbb-3(b)(1), unless the authorization is terminated or revoked.     Resp Syncytial Virus by PCR NEGATIVE NEGATIVE Final    Comment: (NOTE) Fact Sheet for Patients: BloggerCourse.com  Fact Sheet for  Healthcare Providers: SeriousBroker.it  This test is not yet approved or cleared by the [place]  FDA and has been authorized for detection and/or diagnosis of SARS-CoV-2 by FDA under an Emergency Use Authorization (EUA). This EUA will remain in effect (meaning this test can be used) for the duration of the COVID-19 declaration under Section 564(b)(1) of the Act, 21 U.S.C. section 360bbb-3(b)(1), unless the authorization is terminated or revoked.  Performed at [facility], [address] [place], [place] [address]   Respiratory (~20 pathogens) panel by PCR     Status: None   Collection Time: 01/29/24 11:13 PM   Specimen: Nasopharyngeal Swab; Respiratory  Result Value Ref Range Status   Adenovirus NOT DETECTED NOT DETECTED Final   Coronavirus 229E NOT DETECTED NOT DETECTED Final    Comment: (NOTE) The Coronavirus on the Respiratory Panel, DOES NOT test for the novel  Coronavirus (2019 nCoV)    Coronavirus HKU1 NOT DETECTED NOT DETECTED Final   Coronavirus NL63 NOT DETECTED NOT DETECTED Final   Coronavirus OC43 NOT DETECTED NOT DETECTED Final   Metapneumovirus NOT DETECTED NOT DETECTED Final   Rhinovirus / Enterovirus NOT DETECTED NOT DETECTED Final   Influenza A NOT DETECTED NOT DETECTED Final   Influenza B NOT DETECTED NOT DETECTED Final   Parainfluenza Virus 1 NOT DETECTED NOT DETECTED Final   Parainfluenza Virus 2 NOT DETECTED NOT DETECTED Final   Parainfluenza Virus 3 NOT DETECTED NOT DETECTED Final   Parainfluenza Virus 4 NOT DETECTED NOT DETECTED Final   Respiratory Syncytial Virus NOT DETECTED NOT DETECTED Final   Bordetella pertussis NOT DETECTED NOT DETECTED Final   Bordetella Parapertussis NOT DETECTED NOT DETECTED Final   Chlamydophila pneumoniae NOT DETECTED NOT DETECTED Final   Mycoplasma pneumoniae NOT DETECTED NOT DETECTED Final    Comment: Performed at [facility], [address] [place], [place]  [address]         Radiology Studies: No results found.  Scheduled Meds:  apixaban   5 mg Oral BID   budesonide  (PULMICORT ) nebulizer solution  0.25 mg Nebulization BID   ipratropium-albuterol   3 mL Nebulization Q6H   levothyroxine   125 mcg Oral Q0600   lidocaine   2 patch Transdermal Q24H   magnesium  oxide  400 mg Oral Daily   methylPREDNISolone  (SOLU-MEDROL ) injection  40 mg Intravenous Q12H   metoprolol  succinate  12.5 mg Oral Daily   rosuvastatin   20 mg Oral Daily   sertraline   25 mg Oral Daily   Continuous Infusions:     LOS: 2 days   Time spent:  Haydee Lipa, DO [facility]  If 7PM-7AM, please contact night-coverage [email or web address]  02/08/2024, 8:29 AM

## 2024-02-08 NOTE — Plan of Care (Signed)
  Problem: Education: Goal: Knowledge of General Education information will improve Description: Including pain rating scale, medication(s)/side effects and non-pharmacologic comfort measures Outcome: Progressing   Problem: Clinical Measurements: Goal: Ability to maintain clinical measurements within normal limits will improve Outcome: Progressing   Problem: Clinical Measurements: Goal: Respiratory complications will improve Outcome: Progressing   Problem: Safety: Goal: Ability to remain free from injury will improve Outcome: Progressing   Problem: Safety: Goal: Ability to remain free from injury will improve Outcome: Progressing   Problem: Coping: Goal: Level of anxiety will decrease Outcome: Progressing

## 2024-02-08 NOTE — Progress Notes (Signed)
 Tracy Park [phone] [facility] Liaison Note   [facility] continues to follow for discharge disposition for hospice services at home.    Please call with any hospice related questions or concerns.  Madelene Schanz, BSN, RN, [facility]

## 2024-02-08 NOTE — Progress Notes (Signed)
   02/08/24 2248  BiPAP/CPAP/SIPAP  Reason BIPAP/CPAP not in use Non-compliant (PATIENT REFUSES. UNIT NOT IN ROOM)  BiPAP/CPAP /SiPAP Vitals  Resp 19  MEWS Score/Color  MEWS Score 0  MEWS Score Color Marrie Sizer

## 2024-02-08 NOTE — Plan of Care (Signed)

## 2024-02-09 DIAGNOSIS — J441 Chronic obstructive pulmonary disease with (acute) exacerbation: Secondary | ICD-10-CM | POA: Diagnosis not present

## 2024-02-09 LAB — BASIC METABOLIC PANEL WITH GFR
Anion gap: 9 (ref 5–15)
BUN: 23 mg/dL (ref 8–23)
CO2: 29 mmol/L (ref 22–32)
Calcium: 9 mg/dL (ref 8.9–10.3)
Chloride: 99 mmol/L (ref 98–111)
Creatinine, Ser: 0.59 mg/dL (ref 0.44–1.00)
GFR, Estimated: >60 mL/min (ref >60)
Glucose, Bld: 181 mg/dL — ABNORMAL HIGH (ref 70–99)
Potassium: 3.8 mmol/L (ref 3.5–5.1)
Sodium: 137 mmol/L (ref 135–145)

## 2024-02-09 MED ORDER — SODIUM CHLORIDE 3 % IN NEBU: 4.0000 mL | INHALATION_SOLUTION | Freq: Three times a day (TID) | RESPIRATORY_TRACT | 0 refills | Status: AC

## 2024-02-09 MED ORDER — PREDNISONE 10 MG PO TABS: ORAL_TABLET | ORAL | 0 refills | Status: DC

## 2024-02-09 NOTE — Plan of Care (Signed)

## 2024-02-09 NOTE — TOC Transition Note (Signed)
 Transition of Care [facility]) - Discharge Note   Patient Details  Name: Tracy Park MRN: [id number] Date of Birth: [birth year]  Transition of [facility]) CM/SW Contact:  Jonni Nettle, LCSW Phone Number: 02/09/2024, 3:46 PM   Clinical Narrative:    [facility] consulted for transportation needs. CSW provided [employer]; left on pt's chart at [employer] station. [employer] Waiver form signed by pt. RN to call taxi once pt is ready to discharge. No further TOC needs at this time.   Final next level of care: Home w Hospice Care Barriers to Discharge: Barriers Resolved   Patient Goals and CMS Choice Patient states their goals for this hospitalization and ongoing recovery are:: To return home CMS Medicare.gov Compare Post Acute Care list provided to:: Other (Comment Required) (NA) Choice offered to / list presented to : NA [place] ownership interest in [facility].provided to:: Parent NA    Discharge Placement Home with hospice  Discharge Plan and Services Additional resources added to the After Visit Summary for [facility] In-house Referral: Hospice / Palliative Care Discharge Planning Services: CM Consult Post Acute Care Choice: Hospice          DME Arranged: N/A DME Agency: NA       HH Arranged: NA HH Agency: Other - See comment Photographer) Date HH Agency Contacted: 02/06/24 Time HH Agency Contacted: 1327 Representative spoke with at [facility] Agency: Madelene Schanz, RN- with Civil engineer, contracting  Social Drivers of Health (SDOH) Interventions SDOH Screenings   Food Insecurity: No Food Insecurity (02/07/2024)  Housing: Low Risk  (02/07/2024)  Transportation Needs: No Transportation Needs (02/07/2024)  Utilities: Not At Risk (02/07/2024)  Financial Resource Strain: High Risk (10/08/2023)   Received from [facility]  Physical Activity: Unknown (04/07/2023)   Received from [facility]  Social Connections: Moderately Integrated (02/07/2024)  Recent Concern:  Social Connections - Socially Isolated (12/30/2023)  Stress: Stress Concern Present (04/07/2023)   Received from [facility]  Tobacco Use: High Risk (02/06/2024)     Readmission Risk Interventions    01/14/2024    2:11 PM 01/05/2024   12:08 PM 12/30/2023   11:48 AM  Readmission Risk Prevention Plan  Transportation Screening Complete Complete Complete  Medication Review Oceanographer) Complete Complete Complete  PCP or Specialist appointment within 3-5 days of discharge Complete Complete Complete  HRI or Home Care Consult -- Complete Complete  SW Recovery Care/Counseling Consult -- Complete Complete  Palliative Care Screening Not Applicable Not Applicable Not Applicable  Skilled Nursing Facility Not Applicable Not Applicable Not Applicable    [place], MSW, LCSW 02/09/2024 3:49 PM

## 2024-02-09 NOTE — Discharge Summary (Signed)
 Physician Discharge Summary  Tracy Park WJX:[id number] DOB: 10/17/1955 DOA: 02/05/2024  PCP: Chana Comas, FNP  Admit date: 02/05/2024 Discharge date: 02/09/2024  Admitted From: Home Disposition: Home  Recommendations for Outpatient Follow-up:  Follow up with PCP in 1-2 weeks Follow-up with pulmonology as scheduled next 1 to 2 weeks  Home Health: None Equipment/Devices: Oxygen   Discharge Condition: Stable CODE STATUS: DNR Diet recommendation: Low-salt low-fat diet  Brief/Interim Summary: Tracy Park is a 68 year old woman, daily smoker with COPD on 2L [place] at baseline, OSA, paroxysmal atrial fibrillation on eliquis  and stage 3A left upper lobe lung adenocarcinoma s/p left upper lobectomy 2018, hypertension, hyperlipidemia who was recently admitted to the hospital from 01/29/2024 through 01/31/2024 for COPD exacerbation also had multiple recent admissions for the same presents to the ER. Hospitalist called for admission. Patient still smokes cigarettes. No interest in quitting   Patient admitted as above with recurrent episode of acute on chronic hypoxic respiratory failure.  Patient had multiple hospitalizations -this is her 17th admission this year already for COPD exacerbation and hypoxia.  Lengthy discussion daily on goals of care, patient following up with palliative care as discussed, she is DNR -would not want to be placed on ventilator and understands her respiratory status will continue to deteriorate as she continues to smoke.  At this time she is stabilized, and while dyspneic with exertion and requires oxygen  even at rest she is stable for discharge home.  Discussed need for smoking cessation as well as close follow-up with PCP and pulmonology in the next 1 to 2 weeks for medical management.  Discharge Diagnoses:  Principal Problem:   COPD exacerbation (HCC) Active Problems:   Acute on chronic respiratory failure with hypoxia (HCC)   Anxiety and depression   History of lung  cancer   Essential hypertension   Paroxysmal atrial fibrillation (HCC)   Chronic respiratory failure with hypoxia (HCC)   Hypothyroidism   Hyperlipidemia   Dyslipidemia   Normocytic anemia   Obstructive sleep apnea (adult) (pediatric)   Hypokalemia  Chronic hypoxia Acute respiratory distress Acute recurrent COPD exacerbation Rule out underlying pneumonia History of lung cancer status post left upper lung lobectomy -Profound tachypnea and respiratory accessory muscle use, improving - Pulmonology consulted, appreciate insight and recommendations with this difficult case - Discharged on steroid taper as above, no indication for antibiotics   History of factitious disorder with predominantly psychological signs and symptoms - Questionable musculoskeletal sprain/strain as she "twisted" her back over the weekend but has been able to ambulate without any additional support or assistance from baseline. -Recommend close follow-up with PCP for ongoing evaluation and possible further outpatient evaluation by PT OT although while here she did not require formal evaluation or imaging given she had not fallen had no focal weakness or other issues. -Patient has been ambulating to the restroom independently since injury without difficulty -Similar episode during last hospitalization with reported 'inability to walk' but notes 400+ feet of ambulation without issues   Paroxysmal A-fib, rate controlled - Continue Eliquis , metoprolol    Hypertension-Well-controlled, continue metoprolol  Hypokalemia- Repleted, follow repeat labs Chronic anemia of chronic disease -Stable, at baseline Hypothyroidism -Continue levothyroxine  Hyperlipidemia -Continue statin Chronic depression - Continue sertraline   Discharge Instructions  Discharge Instructions     Call MD for:  difficulty breathing, headache or visual disturbances   Complete by: As directed    Call MD for:  extreme fatigue   Complete by: As directed     Call MD for:  persistant  dizziness or light-headedness   Complete by: As directed    Diet - low sodium heart healthy   Complete by: As directed    Increase activity slowly   Complete by: As directed       Allergies as of 02/09/2024       Reactions   Red Dye #40 (allura Red) Hives, Itching, Other (See Comments)   Red food dye   Strawberry Extract Hives, Itching   Tomato Hives, Itching   Aspirin Hives   Tape Rash, Other (See Comments)   Prefers paper tape   Wound Dressing Adhesive Rash        Medication List     TAKE these medications    albuterol  (2.5 MG/3ML) 0.083% nebulizer solution Commonly known as: PROVENTIL  Take 2.5 mg by nebulization every 4 (four) hours as needed for wheezing or shortness of breath.   albuterol  108 (90 Base) MCG/ACT inhaler Commonly known as: VENTOLIN  HFA Inhale 2 puffs into the lungs every 6 (six) hours as needed for wheezing or shortness of breath.   artificial tears ophthalmic solution Place 1 drop into both eyes in the morning, at noon, in the evening, and at bedtime.   Breztri  Aerosphere 160-9-4.8 MCG/ACT Aero inhaler Generic drug: budesonide -glycopyrrolate -formoterol  Inhale 1 puff into the lungs in the morning and at bedtime. What changed: how much to take   Eliquis  5 MG Tabs tablet Generic drug: apixaban  Take 1 tablet (5 mg total) by mouth 2 (two) times daily.   levothyroxine  125 MCG tablet Commonly known as: SYNTHROID  Take 125 mcg by mouth daily before breakfast.   magnesium  oxide 400 (240 Mg) MG tablet Commonly known as: MAG-OX Take 400 mg by mouth in the morning.   metoprolol  succinate 25 MG 24 hr tablet Commonly known as: TOPROL -XL Take 12.5 mg by mouth daily.   nitroGLYCERIN  0.4 MG SL tablet Commonly known as: NITROSTAT  Place 0.4 mg under the tongue every 5 (five) minutes as needed for chest pain.   OXYGEN  Inhale 2 L/min into the lungs continuous.   prednisoLONE  acetate 1 % ophthalmic suspension Commonly known  as: PRED FORTE  Place 1 drop into the left eye 4 (four) times daily.   predniSONE  10 MG tablet Commonly known as: DELTASONE  Take 4 tablets (40 mg total) by mouth daily for 3 days, THEN 3 tablets (30 mg total) daily for 3 days, THEN 2 tablets (20 mg total) daily for 3 days, THEN 1 tablet (10 mg total) daily for 3 days. Start taking on: February 09, 2024 What changed:  medication strength See the new instructions.   rosuvastatin  20 MG tablet Commonly known as: CRESTOR  Take 20 mg by mouth daily.   sertraline  25 MG tablet Commonly known as: ZOLOFT  Take 25 mg by mouth in the morning.   sodium chloride  HYPERTONIC 3 % nebulizer solution Take 4 mLs by nebulization 3 (three) times daily for 15 days. Vials are single use vials. What changed: when to take this   Systane Complete PF 0.6 % Soln Generic drug: Propylene Glycol (PF) Place 1 drop into both eyes 4 (four) times daily as needed.        Follow-up Information     [facility] Follow up.   Specialty: Hospice and Palliative Medicine Why: Please follow up with this provider for home hospice services upon discharge. Contact information: [address] [facility] [place]  [phone]               Allergies  Allergen Reactions   Red Dye #  40 (Allura Red) Hives, Itching and Other (See Comments)    Red food dye   Strawberry Extract Hives and Itching   Tomato Hives and Itching   Aspirin Hives   Tape Rash and Other (See Comments)    Prefers paper tape   Wound Dressing Adhesive Rash    Consultations: Pulmonology  Procedures/Studies: DG Chest Port 1 View Result Date: 02/05/2024 CLINICAL DATA:  Shortness of breath for 1 month EXAM: PORTABLE CHEST 1 VIEW COMPARISON:  01/29/2024 FINDINGS: No change from 01/29/2024. Emphysema. Pleural-parenchymal opacities with volume loss and scarring in the left apex. The right lung is clear. Postoperative change about the left upper lobe. Chronic pleural thickening or  small pleural effusion in left lung. No pneumothorax. Cardiomediastinal silhouette within normal limits. IMPRESSION: No change from 01/29/2024. Emphysema with pleural-parenchymal opacities and volume loss/scarring in the left apex. Electronically Signed   By: Rozell Cornet M.D.   On: 02/05/2024 19:35   DG Chest 2 View Result Date: 01/29/2024 CLINICAL DATA:  Cough and shortness of breath EXAM: CHEST - 2 VIEW COMPARISON:  Chest x-ray 01/26/2024 FINDINGS: Pleuroparenchymal opacities and volume loss in the left lung apex appear unchanged from prior. The lungs are otherwise clear. No pleural effusion or pneumothorax. The cardiomediastinal silhouette is within normal limits. Tracheal deviation to the left is unchanged. No acute fractures are seen. IMPRESSION: 1. Stable chronic changes in the left lung apex. 2. No acute cardiopulmonary process. Electronically Signed   By: Tyron Gallon M.D.   On: 01/29/2024 19:00   DG Chest 2 View Result Date: 01/26/2024 CLINICAL DATA:  Shortness of breath. EXAM: CHEST - 2 VIEW COMPARISON:  Multiple prior exams, most recent radiograph 01/13/2024. Most recent CT 01/10/2024 FINDINGS: Chronic left lung volume loss. Left lung postsurgical change with stable pleuroparenchymal opacity at the apex. The heart is normal in size. Background emphysema and hyperinflation. No acute airspace disease. No significant pleural effusion. No pulmonary edema. No pneumothorax. Stable osseous structures. IMPRESSION: 1. No acute chest findings. 2. Chronic left lung volume loss and postsurgical change. 3. Background emphysema. Electronically Signed   By: Chadwick Colonel M.D.   On: 01/26/2024 17:53   DG Chest 2 View Result Date: 01/13/2024 CLINICAL DATA:  hx of COPD reporting productive cough EXAM: CHEST - 2 VIEW COMPARISON:  None available. FINDINGS: Emphysema. Persistent pleural thickening and patchy airspace opacities in the left upper lung zone, likely scarring. No new airspace consolidation or  pneumothorax. Blunting of both costophrenic sulci. No cardiomegaly. No acute fracture or destructive lesion. IMPRESSION: 1. Emphysema.  No pneumonia or pulmonary edema. 2. Chronic left upper lung zone scarring and pleural thickening. Electronically Signed   By: Rance Burrows M.D.   On: 01/13/2024 17:50   CT Angio Chest PE W and/or Wo Contrast Result Date: 01/10/2024 CLINICAL DATA:  Shortness of breath and right-sided pain, initial encounter EXAM: CT ANGIOGRAPHY CHEST WITH CONTRAST TECHNIQUE: Multidetector CT imaging of the chest was performed using the standard protocol during bolus administration of intravenous contrast. Multiplanar CT image reconstructions and MIPs were obtained to evaluate the vascular anatomy. RADIATION DOSE REDUCTION: This exam was performed according to the departmental dose-optimization program which includes automated exposure control, adjustment of the mA and/or kV according to patient size and/or use of iterative reconstruction technique. CONTRAST:  80mL OMNIPAQUE  IOHEXOL  350 MG/ML SOLN COMPARISON:  12/13/2023 FINDINGS: Cardiovascular: Thoracic aorta and its branches demonstrate atherosclerotic calcification. No aneurysmal dilatation or dissection is noted. The pulmonary artery shows a normal branching pattern bilaterally. No  intraluminal filling defect to suggest pulmonary embolism is seen. Mediastinum/Nodes: Thoracic inlet is within normal limits. Mediastinal shift to the left is noted related to volume loss from prior left upper lobectomy. This is stable from the prior study. No hilar or mediastinal adenopathy is noted. The esophagus is within normal limits. Lungs/Pleura: Lungs are well aerated bilaterally. Diffuse emphysematous changes are identified. Some areas of pleural thickening are noted on the right stable from the prior exam. Stable consolidation in the posterior aspect of the right upper lobe is again seen. Changes of prior left upper lobectomy are noted with volume loss  as described. Some stable areas of scarring are noted in the residual left lower lobe. No focal confluent infiltrate is seen. Upper Abdomen: No acute abnormality. Musculoskeletal: No chest wall abnormality. No acute or significant osseous findings. Review of the MIP images confirms the above findings. IMPRESSION: Postsurgical changes consistent with left upper lobectomy. No evidence of pulmonary embolism. Chronic changes in both lungs back to prior exams in 2025. Aortic Atherosclerosis (ICD10-I70.0) and Emphysema (ICD10-J43.9). Electronically Signed   By: Violeta Grey M.D.   On: 01/10/2024 21:14     Subjective: No acute issues or events overnight denies nausea vomiting diarrhea constipation any fevers chills chest pain   Discharge Exam: Vitals:   02/09/24 1016 02/09/24 1251  BP:  (!) 138/92  Pulse:  79  Resp:  16  Temp:  97.8 F (36.6 C)  SpO2: 98% 100%   Vitals:   02/09/24 0348 02/09/24 1012 02/09/24 1016 02/09/24 1251  BP: 125/86   (!) 138/92  Pulse: 71   79  Resp: 16   16  Temp: 97.7 F (36.5 C)   97.8 F (36.6 C)  TempSrc:      SpO2: 100% 98% 98% 100%  Weight:      Height:        General: Pt is alert, awake, not in acute distress Cardiovascular: RRR, S1/S2 +, no rubs, no gallops Respiratory: Diminished bilaterally, left-sided wheezes noted at end expiration but otherwise without rales or rhonchi Abdominal: Soft, NT, ND, bowel sounds + Extremities: no edema, no cyanosis    The results of significant diagnostics from this hospitalization (including imaging, microbiology, ancillary and laboratory) are listed below for reference.     Microbiology: No results found for this or any previous visit (from the past 240 hours).   Labs: BNP (last 3 results) Recent Labs    01/02/24 1300 01/15/24 0448 02/05/24 1907  BNP 124.9* 95.3 94.4   Basic Metabolic Panel: Recent Labs  Lab 02/05/24 1907 02/06/24 0508 02/09/24 0459  NA 144 140 137  K 3.3* 3.7 3.8  CL 109 105 99   CO2 26 32 29  GLUCOSE 167* 125* 181*  BUN 20 21 23   CREATININE 0.48 0.49 0.59  CALCIUM  7.5* 8.3* 9.0   Liver Function Tests: Recent Labs  Lab 02/05/24 1907 02/06/24 0508  AST 13* 15  ALT 14 16  ALKPHOS 50 53  BILITOT 0.4 0.5  PROT 5.5* 6.2*  ALBUMIN 3.0* 3.3*   No results for input(s): "LIPASE", "AMYLASE" in the last 168 hours. No results for input(s): "AMMONIA" in the last 168 hours. CBC: Recent Labs  Lab 02/05/24 1907 02/06/24 0508 02/08/24 1037  WBC 7.7 5.8 8.3  NEUTROABS 7.1 5.1  --   HGB 11.7* 11.2* 11.5*  HCT 37.9 37.1 36.8  MCV 96.4 96.1 93.2  PLT 181 168 189   Cardiac Enzymes: No results for input(s): "CKTOTAL", "CKMB", "CKMBINDEX", "TROPONINI"  in the last 168 hours. BNP: Invalid input(s): "POCBNP" CBG: No results for input(s): "GLUCAP" in the last 168 hours. D-Dimer No results for input(s): "DDIMER" in the last 72 hours. Hgb A1c No results for input(s): "HGBA1C" in the last 72 hours. Lipid Profile No results for input(s): "CHOL", "HDL", "LDLCALC", "TRIG", "CHOLHDL", "LDLDIRECT" in the last 72 hours. Thyroid  function studies No results for input(s): "TSH", "T4TOTAL", "T3FREE", "THYROIDAB" in the last 72 hours.  Invalid input(s): "FREET3" Anemia work up No results for input(s): "VITAMINB12", "FOLATE", "FERRITIN", "TIBC", "IRON ", "RETICCTPCT" in the last 72 hours. Urinalysis    Component Value Date/Time   COLORURINE YELLOW 06/25/2023 0536   APPEARANCEUR CLEAR 06/25/2023 0536   LABSPEC 1.023 06/25/2023 0536   PHURINE 6.0 06/25/2023 0536   GLUCOSEU 50 (A) 06/25/2023 0536   HGBUR SMALL (A) 06/25/2023 0536   BILIRUBINUR NEGATIVE 06/25/2023 0536   KETONESUR NEGATIVE 06/25/2023 0536   PROTEINUR NEGATIVE 06/25/2023 0536   UROBILINOGEN 0.2 11/06/2009 1559   NITRITE NEGATIVE 06/25/2023 0536   LEUKOCYTESUR NEGATIVE 06/25/2023 0536   Sepsis Labs Recent Labs  Lab 02/05/24 1907 02/06/24 0508 02/08/24 1037  WBC 7.7 5.8 8.3   Microbiology No  results found for this or any previous visit (from the past 240 hours).   Time coordinating discharge: Over 30 minutes  SIGNED:   Haydee Lipa, DO [facility] 02/09/2024, 3:10 PM Pager   If 7PM-7AM, please contact night-coverage [email or web address]

## 2024-02-09 NOTE — Progress Notes (Signed)
SATURATION QUALIFICATIONS: (This note is used to comply with regulatory documentation for home oxygen)  Patient Saturations on Room Air at Rest = 96%  Patient Saturations on Room Air while Ambulating = 94%  Patient Saturations on 2 Liters of oxygen while Ambulating = 96%  Please briefly explain why patient needs home oxygen:

## 2024-02-10 ENCOUNTER — Emergency Department (HOSPITAL_COMMUNITY)
Admission: EM | Admit: 2024-02-10 | Discharge: 2024-02-10 | Disposition: A | Discharge status: Home / Self Care | Attending: Emergency Medicine | Admitting: Emergency Medicine

## 2024-02-10 ENCOUNTER — Other Ambulatory Visit: Payer: Self-pay

## 2024-02-10 ENCOUNTER — Emergency Department (HOSPITAL_COMMUNITY)

## 2024-02-10 DIAGNOSIS — J441 Chronic obstructive pulmonary disease with (acute) exacerbation: Secondary | ICD-10-CM | POA: Diagnosis not present

## 2024-02-10 DIAGNOSIS — R062 Wheezing: Secondary | ICD-10-CM | POA: Insufficient documentation

## 2024-02-10 DIAGNOSIS — M5442 Lumbago with sciatica, left side: Secondary | ICD-10-CM

## 2024-02-10 DIAGNOSIS — M545 Low back pain, unspecified: Secondary | ICD-10-CM | POA: Insufficient documentation

## 2024-02-10 DIAGNOSIS — X501XXA Overexertion from prolonged static or awkward postures, initial encounter: Secondary | ICD-10-CM | POA: Diagnosis not present

## 2024-02-10 DIAGNOSIS — Z7901 Long term (current) use of anticoagulants: Secondary | ICD-10-CM | POA: Diagnosis not present

## 2024-02-10 MED ORDER — LIDOCAINE 5 % EX PTCH: 1.0000 | MEDICATED_PATCH | CUTANEOUS | 0 refills | Status: DC

## 2024-02-10 MED ORDER — OXYCODONE HCL 5 MG PO TABS: 5.0000 mg | ORAL_TABLET | ORAL | 0 refills | Status: DC | PRN

## 2024-02-10 MED ORDER — MORPHINE SULFATE (PF) 4 MG/ML IV SOLN
6.0000 mg | Freq: Once | INTRAVENOUS | Status: AC
  Administered 2024-02-10: 6 mg via INTRAMUSCULAR
  Filled 2024-02-10: qty 2

## 2024-02-10 MED ORDER — DIAZEPAM 5 MG PO TABS
5.0000 mg | ORAL_TABLET | Freq: Once | ORAL | Status: AC
  Administered 2024-02-10: 5 mg via ORAL
  Filled 2024-02-10: qty 1

## 2024-02-10 MED ORDER — CYCLOBENZAPRINE HCL 10 MG PO TABS: 10.0000 mg | ORAL_TABLET | Freq: Two times a day (BID) | ORAL | 0 refills | Status: DC | PRN

## 2024-02-10 NOTE — ED Provider Notes (Signed)
  Physical Exam  BP 135/82   Pulse 98   Temp 98.1 F (36.7 C)   Resp 20   Ht 5\' 3"  (1.6 m)   Wt 52.5 kg   SpO2 98%   BMI 20.50 kg/m   Physical Exam  Procedures  Procedures  ED Course / MDM    Medical Decision Making Amount and/or Complexity of Data Reviewed Radiology: ordered.  Risk Prescription drug management.   ***  Acute back pain, no signs of cauda equina.

## 2024-02-10 NOTE — ED Triage Notes (Signed)
 Pt bib [facility] for lower left back pain that radiates down leg and wheezing from chronic copd. Duoneb given in route

## 2024-02-10 NOTE — ED Provider Notes (Signed)
 [place] EMERGENCY DEPARTMENT AT [facility] Provider Note   CSN: [id number] Arrival date & time: 02/10/24  1328     History  Chief Complaint  Patient presents with   Wheezing   Back Pain      Tracy Park is a 68 y.o. female discharged yesterday for COPD exacerbation who presents with 10/10 low back pain with radiation down left leg to foot.  Patient states she was getting ready to leave hospital yesterday and was coming off the toilet when she twisted and felt a sharp stabbing back pain.  Pain has persisted with aforementioned radiation.  No prior history with this. She denies perennial numbness and incontinence.  She has tried oxycodone  which she takes for chronic "lung pain" and Tylenol  with minimal relief.   Wheezing Back Pain      Home Medications Prior to Admission medications   Medication Sig Start Date End Date Taking? Authorizing Provider  albuterol  (PROVENTIL ) (2.5 MG/3ML) 0.083% nebulizer solution Take 2.5 mg by nebulization every 4 (four) hours as needed for wheezing or shortness of breath.    [provider]  albuterol  (VENTOLIN  HFA) 108 (90 Base) MCG/ACT inhaler Inhale 2 puffs into the lungs every 6 (six) hours as needed for wheezing or shortness of breath. 11/07/23   Amin, Ankit C, MD  apixaban  (ELIQUIS ) 5 MG TABS tablet Take 1 tablet (5 mg total) by mouth 2 (two) times daily. 06/06/23   Lorita Rosa, MD  budesonide -glycopyrrolate -formoterol  (BREZTRI  AEROSPHERE) 160-9-4.8 MCG/ACT AERO inhaler Inhale 1 puff into the lungs in the morning and at bedtime. Patient taking differently: Inhale 2 puffs into the lungs in the morning and at bedtime. 01/23/24 02/22/24  Chatterjee, Srobona Tublu, MD  levothyroxine  (SYNTHROID ) 125 MCG tablet Take 125 mcg by mouth daily before breakfast.    [provider]  magnesium  oxide (MAG-OX) 400 (240 Mg) MG tablet Take 400 mg by mouth in the morning.    [provider]  metoprolol  succinate  (TOPROL -XL) 25 MG 24 hr tablet Take 12.5 mg by mouth daily.    [provider]  nitroGLYCERIN  (NITROSTAT ) 0.4 MG SL tablet Place 0.4 mg under the tongue every 5 (five) minutes as needed for chest pain.    [provider]  OXYGEN  Inhale 2 L/min into the lungs continuous.    [provider]  polyvinyl alcohol  (LIQUIFILM TEARS) 1.4 % ophthalmic solution Place 1 drop into both eyes in the morning, at noon, in the evening, and at bedtime.    [provider]  prednisoLONE  acetate (PRED FORTE ) 1 % ophthalmic suspension Place 1 drop into the left eye 4 (four) times daily. 11/19/23   [provider]  predniSONE  (DELTASONE ) 10 MG tablet Take 4 tablets (40 mg total) by mouth daily for 3 days, THEN 3 tablets (30 mg total) daily for 3 days, THEN 2 tablets (20 mg total) daily for 3 days, THEN 1 tablet (10 mg total) daily for 3 days. 02/09/24 02/21/24  Haydee Lipa, MD  rosuvastatin  (CRESTOR ) 20 MG tablet Take 20 mg by mouth daily.    [provider]  sertraline  (ZOLOFT ) 25 MG tablet Take 25 mg by mouth in the morning.    [provider]  sodium chloride  HYPERTONIC 3 % nebulizer solution Take 4 mLs by nebulization 3 (three) times daily for 15 days. Vials are single use vials. 02/09/24 02/24/24  Haydee Lipa, MD  SYSTANE COMPLETE PF 0.6 % SOLN Place 1 drop into both eyes 4 (four)  times daily as needed.    [provider]      Allergies    Red dye #40 (allura red), Strawberry extract, Tomato, Aspirin, Tape, and Wound dressing adhesive    Review of Systems   Review of Systems  Respiratory:  Positive for wheezing.   Musculoskeletal:  Positive for back pain.    Physical Exam Updated Vital Signs BP 135/82   Pulse 98   Temp 98.1 F (36.7 C)   Resp 20   Ht 5\' 3"  (1.6 m)   Wt 52.5 kg   SpO2 98%   BMI 20.50 kg/m  Physical Exam Constitutional:      Appearance: Normal appearance.  Cardiovascular:     Rate and Rhythm: Normal  rate and regular rhythm.  Pulmonary:     Effort: Pulmonary effort is normal.     Breath sounds: Wheezing and rhonchi present.  Abdominal:     Palpations: Abdomen is soft.     Tenderness: There is no abdominal tenderness.  Musculoskeletal:     Lumbar back: Tenderness present. No swelling, deformity or signs of trauma. Positive left straight leg raise test.     Comments: Left-sided paraspinal   Neurological:     General: No focal deficit present.     Mental Status: She is alert.     Motor: No weakness or atrophy.     ED Results / Procedures / Treatments   Labs (all labs ordered are listed, but only abnormal results are displayed) Labs Reviewed - No data to display  EKG None  Radiology No results found.  Procedures Procedures    Medications Ordered in ED Medications  morphine  (PF) 4 MG/ML injection 6 mg (has no administration in time range)  diazepam  (VALIUM ) tablet 5 mg (has no administration in time range)    ED Course/ Medical Decision Making/ A&P                                 Medical Decision Making Initial Impression: 68 year old female presenting with acute left-sided back pain with radiation down the left leg.  Presentation consistent with sciatica. Will check DG lumbar spine to further assess. Pain relief with morphine /valium .  He is saturating within goal for COPD on 1 L nasal cannula (she is on chronic 2 L). Sign-out given to next call team for further management.   Amount and/or Complexity of Data Reviewed Radiology: ordered.  Risk Prescription drug management.            Final Clinical Impression(s) / ED Diagnoses Final diagnoses:  None    Rx / DC Orders ED Discharge Orders     None         Ronni Colace, DO 02/10/24 1506    Lind Repine, MD 02/12/24 [phone]

## 2024-02-10 NOTE — ED Provider Notes (Signed)
 I saw and evaluated the patient, reviewed the resident's note and I agree with the findings and plan.   68 year old female who presents with low back pain which began yesterday after bending over.  Has no focal neurological findings on exam.  Patient has 5 out of 5 strength bilateral.  She has normal dorsal and plantarflexion bilaterally.  Will order plain x-rays and medicate for pain relief.   Lind Repine, MD 02/10/24 [phone]

## 2024-02-16 ENCOUNTER — Encounter (HOSPITAL_COMMUNITY): Payer: Self-pay

## 2024-02-16 ENCOUNTER — Emergency Department (HOSPITAL_COMMUNITY)

## 2024-02-16 ENCOUNTER — Other Ambulatory Visit: Payer: Self-pay

## 2024-02-16 ENCOUNTER — Emergency Department (HOSPITAL_COMMUNITY)
Admission: EM | Admit: 2024-02-16 | Discharge: 2024-02-16 | Disposition: A | Discharge status: Home / Self Care | Attending: Emergency Medicine | Admitting: Emergency Medicine

## 2024-02-16 DIAGNOSIS — R0602 Shortness of breath: Secondary | ICD-10-CM | POA: Diagnosis present

## 2024-02-16 DIAGNOSIS — J449 Chronic obstructive pulmonary disease, unspecified: Secondary | ICD-10-CM | POA: Diagnosis not present

## 2024-02-16 DIAGNOSIS — R0789 Other chest pain: Secondary | ICD-10-CM | POA: Insufficient documentation

## 2024-02-16 DIAGNOSIS — Z7901 Long term (current) use of anticoagulants: Secondary | ICD-10-CM | POA: Insufficient documentation

## 2024-02-16 DIAGNOSIS — M25552 Pain in left hip: Secondary | ICD-10-CM | POA: Insufficient documentation

## 2024-02-16 DIAGNOSIS — Z79899 Other long term (current) drug therapy: Secondary | ICD-10-CM | POA: Insufficient documentation

## 2024-02-16 DIAGNOSIS — J441 Chronic obstructive pulmonary disease with (acute) exacerbation: Secondary | ICD-10-CM

## 2024-02-16 DIAGNOSIS — Z85118 Personal history of other malignant neoplasm of bronchus and lung: Secondary | ICD-10-CM | POA: Insufficient documentation

## 2024-02-16 DIAGNOSIS — M545 Low back pain, unspecified: Secondary | ICD-10-CM | POA: Insufficient documentation

## 2024-02-16 DIAGNOSIS — I480 Paroxysmal atrial fibrillation: Secondary | ICD-10-CM | POA: Diagnosis not present

## 2024-02-16 DIAGNOSIS — S32040A Wedge compression fracture of fourth lumbar vertebra, initial encounter for closed fracture: Secondary | ICD-10-CM

## 2024-02-16 DIAGNOSIS — Z7951 Long term (current) use of inhaled steroids: Secondary | ICD-10-CM | POA: Insufficient documentation

## 2024-02-16 LAB — BASIC METABOLIC PANEL WITH GFR
Anion gap: 8 (ref 5–15)
BUN: 15 mg/dL (ref 8–23)
CO2: 34 mmol/L — ABNORMAL HIGH (ref 22–32)
Calcium: 8.3 mg/dL — ABNORMAL LOW (ref 8.9–10.3)
Chloride: 97 mmol/L — ABNORMAL LOW (ref 98–111)
Creatinine, Ser: 0.4 mg/dL — ABNORMAL LOW (ref 0.44–1.00)
GFR, Estimated: >60 mL/min (ref >60)
Glucose, Bld: 109 mg/dL — ABNORMAL HIGH (ref 70–99)
Potassium: 3.3 mmol/L — ABNORMAL LOW (ref 3.5–5.1)
Sodium: 139 mmol/L (ref 135–145)

## 2024-02-16 LAB — CBC WITH DIFFERENTIAL/PLATELET
Abs Immature Granulocytes: 0.11 10*3/uL — ABNORMAL HIGH (ref 0.00–0.07)
Basophils Absolute: 0 10*3/uL (ref 0.0–0.1)
Basophils Relative: 0 %
Eosinophils Absolute: 0 10*3/uL (ref 0.0–0.5)
Eosinophils Relative: 0 %
HCT: 41 % (ref 36.0–46.0)
Hemoglobin: 12.4 g/dL (ref 12.0–15.0)
Immature Granulocytes: 1 %
Lymphocytes Relative: 23 %
Lymphs Abs: 2.9 10*3/uL (ref 0.7–4.0)
MCH: 29.1 pg (ref 26.0–34.0)
MCHC: 30.2 g/dL (ref 30.0–36.0)
MCV: 96.2 fL (ref 80.0–100.0)
Monocytes Absolute: 1 10*3/uL (ref 0.1–1.0)
Monocytes Relative: 8 %
Neutro Abs: 8.3 10*3/uL — ABNORMAL HIGH (ref 1.7–7.7)
Neutrophils Relative %: 68 %
Platelets: 215 10*3/uL (ref 150–400)
RBC: 4.26 MIL/uL (ref 3.87–5.11)
RDW: 13.3 % (ref 11.5–15.5)
WBC: 12.4 10*3/uL — ABNORMAL HIGH (ref 4.0–10.5)
nRBC: 0 % (ref 0.0–0.2)

## 2024-02-16 LAB — BLOOD GAS, VENOUS
Acid-Base Excess: 15 mmol/L — ABNORMAL HIGH (ref 0.0–2.0)
Bicarbonate: 43.1 mmol/L — ABNORMAL HIGH (ref 20.0–28.0)
O2 Saturation: 98.6 %
Patient temperature: 37
pCO2, Ven: 68 mmHg — ABNORMAL HIGH (ref 44–60)
pH, Ven: 7.41 (ref 7.25–7.43)
pO2, Ven: 79 mmHg — ABNORMAL HIGH (ref 32–45)

## 2024-02-16 LAB — TROPONIN I (HIGH SENSITIVITY)
Troponin I (High Sensitivity): 15 ng/L
Troponin I (High Sensitivity): 15 ng/L (ref <18)

## 2024-02-16 MED ORDER — OXYCODONE-ACETAMINOPHEN 5-325 MG PO TABS: 1.0000 | ORAL_TABLET | Freq: Four times a day (QID) | ORAL | 0 refills | Status: DC | PRN

## 2024-02-16 MED ORDER — FENTANYL CITRATE PF 50 MCG/ML IJ SOSY
50.0000 ug | PREFILLED_SYRINGE | Freq: Once | INTRAMUSCULAR | Status: AC
  Administered 2024-02-16: 50 ug via INTRAVENOUS
  Filled 2024-02-16: qty 1

## 2024-02-16 MED ORDER — METHYLPREDNISOLONE SODIUM SUCC 125 MG IJ SOLR
125.0000 mg | Freq: Once | INTRAMUSCULAR | Status: AC
  Administered 2024-02-16: 125 mg via INTRAVENOUS
  Filled 2024-02-16: qty 2

## 2024-02-16 MED ORDER — PREDNISONE 10 MG PO TABS: ORAL_TABLET | ORAL | 0 refills | Status: AC

## 2024-02-16 MED ORDER — AZITHROMYCIN 250 MG PO TABS: 250.0000 mg | ORAL_TABLET | Freq: Every day | ORAL | 0 refills | Status: DC

## 2024-02-16 MED ORDER — SODIUM CHLORIDE (PF) 0.9 % IJ SOLN
INTRAMUSCULAR | Status: AC
Start: 2024-02-16 — End: ?
  Filled 2024-02-16: qty 50

## 2024-02-16 MED ORDER — IOHEXOL 300 MG/ML  SOLN
100.0000 mL | Freq: Once | INTRAMUSCULAR | Status: AC | PRN
Start: 2024-02-16 — End: 2024-02-16
  Administered 2024-02-16: 75 mL via INTRAVENOUS

## 2024-02-16 NOTE — Discharge Instructions (Signed)
 Take the medication as prescribed to help with pain and discomfort.  Finish your course of antibiotics and steroids for your COPD.  Follow-up with your primary doctor to recheck on your COPD.  Make an appointment with the spine doctors to make sure your back heals properly

## 2024-02-16 NOTE — ED Notes (Signed)
 [facility] notified for transport

## 2024-02-16 NOTE — ED Notes (Signed)
Ortho called for brace 

## 2024-02-16 NOTE — ED Notes (Signed)
 Patient transported to CT

## 2024-02-16 NOTE — ED Provider Notes (Signed)
 [place] EMERGENCY DEPARTMENT AT [facility] Provider Note   CSN: [id number] Arrival date & time: 02/16/24  0542     History  Chief Complaint  Patient presents with   Back Pain   Shortness of Breath    Tracy Park is a 68 y.o. female.  The history is provided by the patient and medical records.   Tracy Park is a 68 y.o. female who presents to the Emergency Department complaining of shortness of breath and hip pain.  She presents to the emergency department for evaluation of acute on chronic shortness of breath that started several hours prior to ED arrival.  She has a history of COPD and uses her nebulizers at home.  EMS reports stable sats but she was treated with DuoNeb, albuterol  as well as Zofran .  She reports mild chest discomfort.  No fever.  Cough is at her baseline and nonproductive.  She also reports that she fell when she was in the emergency department a few days ago and complains of pain to her left hip and low back.  She has pain on walking and movement.  She does take Eliquis  for history of paroxysmal atrial fibrillation.  Also has a history of lung cancer, in remission.  She states that she is currently taking prednisone  but is unsure of the dose.     Home Medications Prior to Admission medications   Medication Sig Start Date End Date Taking? Authorizing Provider  albuterol  (PROVENTIL ) (2.5 MG/3ML) 0.083% nebulizer solution Take 2.5 mg by nebulization every 4 (four) hours as needed for wheezing or shortness of breath.    [provider]  albuterol  (VENTOLIN  HFA) 108 (90 Base) MCG/ACT inhaler Inhale 2 puffs into the lungs every 6 (six) hours as needed for wheezing or shortness of breath. 11/07/23   Amin, Ankit C, MD  apixaban  (ELIQUIS ) 5 MG TABS tablet Take 1 tablet (5 mg total) by mouth 2 (two) times daily. 06/06/23   Lorita Rosa, MD  budesonide -glycopyrrolate -formoterol  (BREZTRI  AEROSPHERE) 160-9-4.8 MCG/ACT AERO inhaler Inhale 1 puff  into the lungs in the morning and at bedtime. Patient taking differently: Inhale 2 puffs into the lungs in the morning and at bedtime. 01/23/24 02/22/24  Chatterjee, Srobona Tublu, MD  cyclobenzaprine  (FLEXERIL ) 10 MG tablet Take 1 tablet (10 mg total) by mouth 2 (two) times daily as needed for muscle spasms. 02/10/24   Scarlette Currier, MD  levothyroxine  (SYNTHROID ) 125 MCG tablet Take 125 mcg by mouth daily before breakfast.    [provider]  lidocaine  (LIDODERM ) 5 % Place 1 patch onto the skin daily. Remove & Discard patch within 12 hours or as directed by MD 02/10/24   Scarlette Currier, MD  magnesium  oxide (MAG-OX) 400 (240 Mg) MG tablet Take 400 mg by mouth in the morning.    [provider]  metoprolol  succinate (TOPROL -XL) 25 MG 24 hr tablet Take 12.5 mg by mouth daily.    [provider]  nitroGLYCERIN  (NITROSTAT ) 0.4 MG SL tablet Place 0.4 mg under the tongue every 5 (five) minutes as needed for chest pain.    [provider]  oxyCODONE  (ROXICODONE ) 5 MG immediate release tablet Take 1 tablet (5 mg total) by mouth every 4 (four) hours as needed for severe pain (pain score 7-10). 02/10/24   Scarlette Currier, MD  OXYGEN  Inhale 2 L/min into the lungs continuous.    [provider]  polyvinyl alcohol  (LIQUIFILM TEARS) 1.4 % ophthalmic solution Place 1 drop into both eyes  in the morning, at noon, in the evening, and at bedtime.    [provider]  prednisoLONE  acetate (PRED FORTE ) 1 % ophthalmic suspension Place 1 drop into the left eye 4 (four) times daily. 11/19/23   [provider]  predniSONE  (DELTASONE ) 10 MG tablet Take 4 tablets (40 mg total) by mouth daily for 3 days, THEN 3 tablets (30 mg total) daily for 3 days, THEN 2 tablets (20 mg total) daily for 3 days, THEN 1 tablet (10 mg total) daily for 3 days. 02/09/24 02/21/24  Haydee Lipa, MD  rosuvastatin  (CRESTOR ) 20 MG tablet Take 20 mg by mouth daily.    [provider]   sertraline  (ZOLOFT ) 25 MG tablet Take 25 mg by mouth in the morning.    [provider]  sodium chloride  HYPERTONIC 3 % nebulizer solution Take 4 mLs by nebulization 3 (three) times daily for 15 days. Vials are single use vials. 02/09/24 02/24/24  Haydee Lipa, MD  SYSTANE COMPLETE PF 0.6 % SOLN Place 1 drop into both eyes 4 (four) times daily as needed.    [provider]      Allergies    Red dye #40 (allura red), Strawberry extract, Tomato, Aspirin, Tape, and Wound dressing adhesive    Review of Systems   Review of Systems  All other systems reviewed and are negative.   Physical Exam Updated Vital Signs BP 132/77 (BP Location: Right Arm)   Pulse 98   Temp 98.4 F (36.9 C) (Oral)   Resp 18   Ht 5\' 3"  (1.6 m)   Wt 73 kg   SpO2 100%   BMI 28.52 kg/m  Physical Exam Vitals and nursing note reviewed.  Constitutional:      Appearance: She is well-developed.  HENT:     Head: Normocephalic and atraumatic.  Cardiovascular:     Rate and Rhythm: Normal rate and regular rhythm.     Heart sounds: No murmur heard. Pulmonary:     Effort: Pulmonary effort is normal. No respiratory distress.     Comments: Decreased air movement in right lung fields.  Wheezes in the left lung fields.  Mild tachypnea Abdominal:     Palpations: Abdomen is soft.     Tenderness: There is no abdominal tenderness. There is no guarding or rebound.  Musculoskeletal:     Comments: 2+ DP pulses bilaterally.  There is mild tenderness to palpation over the left hip without any overlying ecchymosis.  Skin:    General: Skin is warm and dry.  Neurological:     Mental Status: She is alert and oriented to person, place, and time.     Comments: 5 out of 5 strength in bilateral lower extremities with sensation to light touch intact in bilateral lower extremities  Psychiatric:        Behavior: Behavior normal.     ED Results / Procedures / Treatments   Labs (all labs ordered are listed, but  only abnormal results are displayed) Labs Reviewed  CBC WITH DIFFERENTIAL/PLATELET - Abnormal; Notable for the following components:      Result Value   WBC 12.4 (*)    Neutro Abs 8.3 (*)    Abs Immature Granulocytes 0.11 (*)    All other components within normal limits  BLOOD GAS, VENOUS - Abnormal; Notable for the following components:   pCO2, Ven 68 (*)    pO2, Ven 79 (*)    Bicarbonate 43.1 (*)    Acid-Base Excess 15.0 (*)  All other components within normal limits  BASIC METABOLIC PANEL WITH GFR  TROPONIN I (HIGH SENSITIVITY)    EKG None  Radiology DG Chest Port 1 View Result Date: 02/16/2024 CLINICAL DATA:  Patient fell 3 days ago.  Increased dyspnea. EXAM: PORTABLE CHEST 1 VIEW COMPARISON:  02/05/2024 FINDINGS: Similar volume loss left hemithorax with left apical pleuroparenchymal scarring, stable. Surgical clip noted in the region of the left hilum. Stable blunting left costophrenic angle. Right lung clear. No acute bony abnormality. Telemetry leads overlie the chest. IMPRESSION: 1. No acute cardiopulmonary findings. 2. Stable volume loss left hemithorax with left apical pleuroparenchymal scarring. Electronically Signed   By: Donnal Fusi M.D.   On: 02/16/2024 06:31   DG Hip Unilat W or Wo Pelvis 2-3 Views Left Result Date: 02/16/2024 CLINICAL DATA:  Patient fell 3 days ago.  Left hip pain. EXAM: DG HIP (WITH OR WITHOUT PELVIS) 2-3V LEFT COMPARISON:  None Available. FINDINGS: Bones are diffusely demineralized. SI joints and symphysis pubis unremarkable. No evidence for superior or inferior pubic ramus fracture. No findings to suggest femoral head dislocation. AP and frog-leg lateral views of the left hip show no evidence for femoral neck fracture. IMPRESSION: 1. No acute bony findings. Specifically, no definite evidence for left femoral neck fracture. 2. Diffuse bony demineralization. Electronically Signed   By: Donnal Fusi M.D.   On: 02/16/2024 06:30    Procedures Procedures     Medications Ordered in ED Medications  fentaNYL  (SUBLIMAZE ) injection 50 mcg (50 mcg Intravenous Given 02/16/24 0618)  methylPREDNISolone  sodium succinate (SOLU-MEDROL ) 125 mg/2 mL injection 125 mg (125 mg Intravenous Given 02/16/24 0620)    ED Course/ Medical Decision Making/ A&P                                 Medical Decision Making Amount and/or Complexity of Data Reviewed Labs: ordered. Radiology: ordered.  Risk Prescription drug management.   Patient with history of COPD on as needed home oxygen , lung cancer in remission, paroxysmal atrial fibrillation on anticoagulation here for evaluation of abrupt shortness of breath as well as left sided low back pain/hip pain following a fall several days ago.  On initial evaluation patient with expiratory wheezing, diminished air movement in right lung fields.  After nebulizer, Solu-Medrol  her breathing appears more comfortable.  Plain film of chest is negative for acute abnormality.  Left hip film is negative for acute abnormality.  Given her fall, anticoagulation as well as ongoing pain plan to obtain a CT scan to evaluate for retroperitoneal hematoma or occult fracture.  Pain is improved after medications in department.  Patient care transferred pending additional labs, imaging and reevaluation.        Final Clinical Impression(s) / ED Diagnoses Final diagnoses:  None    Rx / DC Orders ED Discharge Orders     None         Kelsey Patricia, MD 02/16/24 [phone]

## 2024-02-16 NOTE — Progress Notes (Signed)
 Orthopedic Tech Progress Note Patient Details:  Tracy Park 06-May-1956 [id number]  Ortho Devices Type of Ortho Device: Lumbar corsett Ortho Device/Splint Interventions: Ordered, Adjustment   Post Interventions Patient Tolerated: Well Instructions Provided: Care of device, Adjustment of device  [place] A Florinda Hush 02/16/2024, 9:43 AM

## 2024-02-16 NOTE — ED Triage Notes (Signed)
 BIBA from home.  Fell 3 days ago and hurt her lower left back rates pain at 9/10.  SHOB started this afternoon HX COPD  BP 126/80 HR 90 O2 99  NEB 8LMP RR 18 CBG 80  Given 10mg  Albuterol  0.5 mg Atrovent  4mg  Zofran 

## 2024-02-16 NOTE — ED Provider Notes (Signed)
 Patient was initially seen by Dr. Monique Ano.  Please see her note.  Plan was to follow-up on the imaging tests.  Patient's CT scan shows a 25% compression fracture of the lower lumbar spine.  There is mild anterior wedge deformity of T12.  MRI would be helpful for further evaluation.  Will order lso brace.  Make sure pt can ambulate at that time.  Pt can ambulate with her brace.  Her breathing has improved.  Will dc home with steroids, abx, pain meds.  Outpt follow up neurosurgery   Trish Furl, MD 02/16/24 1257

## 2024-02-16 NOTE — ED Notes (Signed)
 PTAR said patient is 4th on the list.

## 2024-02-16 NOTE — ED Notes (Signed)
 Pt ambulated with the back brace on. Little assistance was needed.

## 2024-03-02 ENCOUNTER — Other Ambulatory Visit: Payer: Self-pay

## 2024-03-02 ENCOUNTER — Emergency Department (HOSPITAL_COMMUNITY)

## 2024-03-02 ENCOUNTER — Encounter (HOSPITAL_COMMUNITY): Payer: Self-pay

## 2024-03-02 ENCOUNTER — Inpatient Hospital Stay (HOSPITAL_COMMUNITY)
Admission: EM | Admit: 2024-03-02 | Discharge: 2024-03-08 | DRG: 191 | Disposition: A | Discharge status: Home / Self Care | Attending: Internal Medicine | Admitting: Internal Medicine

## 2024-03-02 DIAGNOSIS — J9622 Acute and chronic respiratory failure with hypercapnia: Secondary | ICD-10-CM | POA: Diagnosis present

## 2024-03-02 DIAGNOSIS — J9612 Chronic respiratory failure with hypercapnia: Secondary | ICD-10-CM | POA: Diagnosis present

## 2024-03-02 DIAGNOSIS — Z9981 Dependence on supplemental oxygen: Secondary | ICD-10-CM

## 2024-03-02 DIAGNOSIS — F319 Bipolar disorder, unspecified: Secondary | ICD-10-CM | POA: Diagnosis present

## 2024-03-02 DIAGNOSIS — Z79899 Other long term (current) drug therapy: Secondary | ICD-10-CM

## 2024-03-02 DIAGNOSIS — Z66 Do not resuscitate: Secondary | ICD-10-CM | POA: Diagnosis present

## 2024-03-02 DIAGNOSIS — F4310 Post-traumatic stress disorder, unspecified: Secondary | ICD-10-CM | POA: Diagnosis present

## 2024-03-02 DIAGNOSIS — J9611 Chronic respiratory failure with hypoxia: Secondary | ICD-10-CM

## 2024-03-02 DIAGNOSIS — F32A Depression, unspecified: Secondary | ICD-10-CM | POA: Diagnosis not present

## 2024-03-02 DIAGNOSIS — Z886 Allergy status to analgesic agent status: Secondary | ICD-10-CM

## 2024-03-02 DIAGNOSIS — I10 Essential (primary) hypertension: Secondary | ICD-10-CM

## 2024-03-02 DIAGNOSIS — F419 Anxiety disorder, unspecified: Secondary | ICD-10-CM

## 2024-03-02 DIAGNOSIS — E890 Postprocedural hypothyroidism: Secondary | ICD-10-CM | POA: Diagnosis present

## 2024-03-02 DIAGNOSIS — G4733 Obstructive sleep apnea (adult) (pediatric): Secondary | ICD-10-CM | POA: Diagnosis present

## 2024-03-02 DIAGNOSIS — Z87442 Personal history of urinary calculi: Secondary | ICD-10-CM

## 2024-03-02 DIAGNOSIS — Z902 Acquired absence of lung [part of]: Secondary | ICD-10-CM | POA: Diagnosis not present

## 2024-03-02 DIAGNOSIS — Z85118 Personal history of other malignant neoplasm of bronchus and lung: Secondary | ICD-10-CM

## 2024-03-02 DIAGNOSIS — Z9109 Other allergy status, other than to drugs and biological substances: Secondary | ICD-10-CM

## 2024-03-02 DIAGNOSIS — Z7901 Long term (current) use of anticoagulants: Secondary | ICD-10-CM | POA: Diagnosis not present

## 2024-03-02 DIAGNOSIS — J9621 Acute and chronic respiratory failure with hypoxia: Secondary | ICD-10-CM | POA: Diagnosis present

## 2024-03-02 DIAGNOSIS — J441 Chronic obstructive pulmonary disease with (acute) exacerbation: Secondary | ICD-10-CM | POA: Diagnosis present

## 2024-03-02 DIAGNOSIS — J439 Emphysema, unspecified: Secondary | ICD-10-CM | POA: Diagnosis present

## 2024-03-02 DIAGNOSIS — I252 Old myocardial infarction: Secondary | ICD-10-CM

## 2024-03-02 DIAGNOSIS — G8929 Other chronic pain: Secondary | ICD-10-CM | POA: Diagnosis present

## 2024-03-02 DIAGNOSIS — I480 Paroxysmal atrial fibrillation: Secondary | ICD-10-CM | POA: Diagnosis present

## 2024-03-02 DIAGNOSIS — Z1152 Encounter for screening for COVID-19: Secondary | ICD-10-CM | POA: Diagnosis not present

## 2024-03-02 DIAGNOSIS — F17210 Nicotine dependence, cigarettes, uncomplicated: Secondary | ICD-10-CM | POA: Diagnosis present

## 2024-03-02 DIAGNOSIS — Z9102 Food additives allergy status: Secondary | ICD-10-CM

## 2024-03-02 DIAGNOSIS — Z8249 Family history of ischemic heart disease and other diseases of the circulatory system: Secondary | ICD-10-CM

## 2024-03-02 DIAGNOSIS — Z91018 Allergy to other foods: Secondary | ICD-10-CM

## 2024-03-02 DIAGNOSIS — R0602 Shortness of breath: Secondary | ICD-10-CM | POA: Diagnosis present

## 2024-03-02 DIAGNOSIS — M549 Dorsalgia, unspecified: Secondary | ICD-10-CM | POA: Diagnosis present

## 2024-03-02 DIAGNOSIS — Z90710 Acquired absence of both cervix and uterus: Secondary | ICD-10-CM

## 2024-03-02 DIAGNOSIS — Z79890 Hormone replacement therapy (postmenopausal): Secondary | ICD-10-CM | POA: Diagnosis not present

## 2024-03-02 LAB — CBC
HCT: 41.7 % (ref 36.0–46.0)
Hemoglobin: 12.6 g/dL (ref 12.0–15.0)
MCH: 29 pg (ref 26.0–34.0)
MCHC: 30.2 g/dL (ref 30.0–36.0)
MCV: 96.1 fL (ref 80.0–100.0)
Platelets: 241 10*3/uL (ref 150–400)
RBC: 4.34 MIL/uL (ref 3.87–5.11)
RDW: 14.2 % (ref 11.5–15.5)
WBC: 9.4 10*3/uL (ref 4.0–10.5)
nRBC: 0 % (ref 0.0–0.2)

## 2024-03-02 LAB — BASIC METABOLIC PANEL WITH GFR
Anion gap: 10 (ref 5–15)
BUN: 9 mg/dL (ref 8–23)
CO2: 37 mmol/L — ABNORMAL HIGH (ref 22–32)
Calcium: 8.9 mg/dL (ref 8.9–10.3)
Chloride: 91 mmol/L — ABNORMAL LOW (ref 98–111)
Creatinine, Ser: 0.54 mg/dL (ref 0.44–1.00)
GFR, Estimated: >60 mL/min (ref >60)
Glucose, Bld: 165 mg/dL — ABNORMAL HIGH (ref 70–99)
Potassium: 3.5 mmol/L (ref 3.5–5.1)
Sodium: 138 mmol/L (ref 135–145)

## 2024-03-02 LAB — RESP PANEL BY RT-PCR (RSV, FLU A&B, COVID)  RVPGX2
Influenza A by PCR: NEGATIVE
Influenza B by PCR: NEGATIVE
Resp Syncytial Virus by PCR: NEGATIVE
SARS Coronavirus 2 by RT PCR: NEGATIVE

## 2024-03-02 MED ORDER — IPRATROPIUM-ALBUTEROL 0.5-2.5 (3) MG/3ML IN SOLN: 3.0000 mL | RESPIRATORY_TRACT | Status: DC | PRN

## 2024-03-02 MED ORDER — POLYETHYLENE GLYCOL 3350 17 G PO PACK: 17.0000 g | PACK | Freq: Every day | ORAL | Status: DC | PRN

## 2024-03-02 MED ORDER — METHYLPREDNISOLONE SODIUM SUCC 125 MG IJ SOLR
125.0000 mg | Freq: Two times a day (BID) | INTRAMUSCULAR | Status: AC
  Administered 2024-03-03 (×2): 125 mg via INTRAVENOUS
  Filled 2024-03-02 (×2): qty 2

## 2024-03-02 MED ORDER — ROSUVASTATIN CALCIUM 20 MG PO TABS
20.0000 mg | ORAL_TABLET | Freq: Every day | ORAL | Status: DC
  Administered 2024-03-03 – 2024-03-08 (×6): 20 mg via ORAL
  Filled 2024-03-02 (×6): qty 1

## 2024-03-02 MED ORDER — ONDANSETRON HCL 4 MG/2ML IJ SOLN
4.0000 mg | Freq: Once | INTRAMUSCULAR | Status: AC
  Administered 2024-03-02: 4 mg via INTRAVENOUS
  Filled 2024-03-02: qty 2

## 2024-03-02 MED ORDER — ACETAMINOPHEN 650 MG RE SUPP: 650.0000 mg | Freq: Four times a day (QID) | RECTAL | Status: DC | PRN

## 2024-03-02 MED ORDER — APIXABAN 5 MG PO TABS
5.0000 mg | ORAL_TABLET | Freq: Two times a day (BID) | ORAL | Status: DC
  Administered 2024-03-03 – 2024-03-08 (×12): 5 mg via ORAL
  Filled 2024-03-02 (×3): qty 1
  Filled 2024-03-02: qty 2
  Filled 2024-03-02 (×8): qty 1

## 2024-03-02 MED ORDER — ONDANSETRON HCL 4 MG PO TABS: 4.0000 mg | ORAL_TABLET | Freq: Four times a day (QID) | ORAL | Status: DC | PRN

## 2024-03-02 MED ORDER — SODIUM CHLORIDE 0.9 % IV SOLN
2.0000 g | Freq: Once | INTRAVENOUS | Status: AC
  Administered 2024-03-03: 2 g via INTRAVENOUS
  Filled 2024-03-02: qty 12.5

## 2024-03-02 MED ORDER — SERTRALINE HCL 25 MG PO TABS
25.0000 mg | ORAL_TABLET | Freq: Every day | ORAL | Status: DC
  Administered 2024-03-03 – 2024-03-08 (×6): 25 mg via ORAL
  Filled 2024-03-02 (×6): qty 1

## 2024-03-02 MED ORDER — METOPROLOL SUCCINATE ER 25 MG PO TB24
12.5000 mg | ORAL_TABLET | Freq: Every day | ORAL | Status: DC
  Administered 2024-03-03 – 2024-03-08 (×6): 12.5 mg via ORAL
  Filled 2024-03-02 (×6): qty 1

## 2024-03-02 MED ORDER — OXYCODONE HCL 5 MG PO TABS
5.0000 mg | ORAL_TABLET | ORAL | Status: DC | PRN
  Administered 2024-03-04 – 2024-03-08 (×8): 5 mg via ORAL
  Filled 2024-03-02 (×8): qty 1

## 2024-03-02 MED ORDER — LEVOTHYROXINE SODIUM 125 MCG PO TABS
125.0000 ug | ORAL_TABLET | Freq: Every day | ORAL | Status: DC
  Administered 2024-03-03 – 2024-03-08 (×6): 125 ug via ORAL
  Filled 2024-03-02 (×6): qty 1

## 2024-03-02 MED ORDER — IPRATROPIUM-ALBUTEROL 0.5-2.5 (3) MG/3ML IN SOLN
3.0000 mL | Freq: Three times a day (TID) | RESPIRATORY_TRACT | Status: DC
  Administered 2024-03-03 (×2): 3 mL via RESPIRATORY_TRACT
  Filled 2024-03-02 (×2): qty 3

## 2024-03-02 MED ORDER — ACETAMINOPHEN 325 MG PO TABS: 650.0000 mg | ORAL_TABLET | Freq: Four times a day (QID) | ORAL | Status: DC | PRN

## 2024-03-02 MED ORDER — CYCLOBENZAPRINE HCL 10 MG PO TABS
10.0000 mg | ORAL_TABLET | Freq: Two times a day (BID) | ORAL | Status: DC | PRN
  Administered 2024-03-03 – 2024-03-08 (×4): 10 mg via ORAL
  Filled 2024-03-02 (×4): qty 1

## 2024-03-02 MED ORDER — ONDANSETRON HCL 4 MG/2ML IJ SOLN
4.0000 mg | Freq: Four times a day (QID) | INTRAMUSCULAR | Status: DC | PRN
Start: 2024-03-02 — End: 2024-03-09

## 2024-03-02 MED ORDER — PREDNISONE 20 MG PO TABS
40.0000 mg | ORAL_TABLET | Freq: Every day | ORAL | Status: DC
Start: 2024-03-04 — End: 2024-03-08
  Administered 2024-03-04: 40 mg via ORAL
  Filled 2024-03-02: qty 2

## 2024-03-02 NOTE — ED Provider Notes (Signed)
 [place] EMERGENCY DEPARTMENT AT [facility] Provider Note   CSN: [id number] Arrival date & time: 03/02/24  2033     Patient presents with: Shortness of Breath   Tracy Park is a 68 y.o. female.    Shortness of Breath Patient with history of severe emphysema.  Presents with shortness of breath.  Reportedly occasional cough.  Started yesterday.  Patient states she is very nauseous however.  Started after arriving to the hospital.  No fevers.  Reviewed recent discharge note with similar symptoms.  Has had 20 visits with 8 admits in the last 6 months.     Prior to Admission medications   Medication Sig Start Date End Date Taking? Authorizing Provider  albuterol  (PROVENTIL ) (2.5 MG/3ML) 0.083% nebulizer solution Take 2.5 mg by nebulization every 4 (four) hours as needed for wheezing or shortness of breath.   Yes [provider]  albuterol  (VENTOLIN  HFA) 108 (90 Base) MCG/ACT inhaler Inhale 2 puffs into the lungs every 6 (six) hours as needed for wheezing or shortness of breath. 11/07/23  Yes Amin, Ankit C, MD  levothyroxine  (SYNTHROID ) 125 MCG tablet Take 125 mcg by mouth daily before breakfast.   Yes [provider]  apixaban  (ELIQUIS ) 5 MG TABS tablet Take 1 tablet (5 mg total) by mouth 2 (two) times daily. 06/06/23   Barbarann Nest, MD  azithromycin  (ZITHROMAX ) 250 MG tablet Take 1 tablet (250 mg total) by mouth daily. Take first 2 tablets together, then 1 every day until finished. 02/16/24   Randol Simmonds, MD  cyclobenzaprine  (FLEXERIL ) 10 MG tablet Take 1 tablet (10 mg total) by mouth 2 (two) times daily as needed for muscle spasms. 02/10/24   Dreama Longs, MD  lidocaine  (LIDODERM ) 5 % Place 1 patch onto the skin daily. Remove & Discard patch within 12 hours or as directed by MD 02/10/24   Dreama Longs, MD  magnesium  oxide (MAG-OX) 400 (240 Mg) MG tablet Take 400 mg by mouth in the morning.    [provider]  metoprolol  succinate (TOPROL -XL)  25 MG 24 hr tablet Take 12.5 mg by mouth daily.    [provider]  nitroGLYCERIN  (NITROSTAT ) 0.4 MG SL tablet Place 0.4 mg under the tongue every 5 (five) minutes as needed for chest pain.    [provider]  oxyCODONE  (ROXICODONE ) 5 MG immediate release tablet Take 1 tablet (5 mg total) by mouth every 4 (four) hours as needed for severe pain (pain score 7-10). 02/10/24   Dreama Longs, MD  oxyCODONE -acetaminophen  (PERCOCET/ROXICET) 5-325 MG tablet Take 1 tablet by mouth every 6 (six) hours as needed for severe pain (pain score 7-10). 02/16/24   Randol Simmonds, MD  OXYGEN  Inhale 2 L/min into the lungs continuous.    [provider]  polyvinyl alcohol  (LIQUIFILM TEARS) 1.4 % ophthalmic solution Place 1 drop into both eyes in the morning, at noon, in the evening, and at bedtime.    [provider]  prednisoLONE  acetate (PRED FORTE ) 1 % ophthalmic suspension Place 1 drop into the left eye 4 (four) times daily. 11/19/23   [provider]  rosuvastatin  (CRESTOR ) 20 MG tablet Take 20 mg by mouth daily.    [provider]  sertraline  (ZOLOFT ) 25 MG tablet Take 25 mg by mouth in the morning.    [provider]  SYSTANE COMPLETE PF 0.6 % SOLN Place 1 drop into both eyes 4 (four) times daily as needed.    [provider]  Allergies: Red dye #40 (allura red), Strawberry extract, Tomato, Aspirin, Tape, and Wound dressing adhesive    Review of Systems  Respiratory:  Positive for shortness of breath.     Updated Vital Signs BP 121/83   Pulse 94   Temp 98.5 F (36.9 C) (Oral)   Resp 15   Ht 5' 3 (1.6 m)   Wt 49.9 kg   SpO2 100%   BMI 19.49 kg/m   Physical Exam Vitals and nursing note reviewed.   Cardiovascular:     Rate and Rhythm: Normal rate.  Pulmonary:     Comments: Somewhat  Musculoskeletal:     Right lower leg: No edema.     Left lower leg: No edema.   Skin:    Capillary Refill: Capillary refill takes less than 2  seconds.   Neurological:     Mental Status: She is alert.     (all labs ordered are listed, but only abnormal results are displayed) Labs Reviewed  BASIC METABOLIC PANEL WITH GFR - Abnormal; Notable for the following components:      Result Value   Chloride 91 (*)    CO2 37 (*)    Glucose, Bld 165 (*)    All other components within normal limits  RESP PANEL BY RT-PCR (RSV, FLU A&B, COVID)  [id number]  CBC    EKG: None  Radiology: DG Chest 2 View Result Date: 03/02/2024 CLINICAL DATA:  Increased shortness of breath EXAM: CHEST - 2 VIEW COMPARISON:  02/16/2024 FINDINGS: Cardiac shadow is stable. Postsurgical changes are noted in the left lung with apical scarring. No acute infiltrate or effusion is seen. No bony abnormality is noted IMPRESSION: Chronic changes in the left apex.  No acute abnormality noted. Electronically Signed   By: Oneil Devonshire M.D.   On: 03/02/2024 21:10     Procedures   Medications Ordered in the ED  ondansetron  (ZOFRAN ) injection 4 mg (4 mg Intravenous Given 03/02/24 2143)                                    Medical Decision Making Amount and/or Complexity of Data Reviewed Labs: ordered. Radiology: ordered.  Risk Prescription drug management.   Patient with shortness of breath.  Occasional cough.  Mild sputum production at times.  Will get x-ray basic blood work.  History of COPD.  Also with nauseousness.   Exam breathing treatment by EMS.  Also Solu-Medrol .  Continued dyspnea.  Not hypoxic however.  With shortness of breath.  With continued shortness of breath I feel she would benefit with admission to the hospital.  Will continue breathing treatments.  Will discuss with hospitalist for admission.       Final diagnoses:  COPD exacerbation [facility])    ED Discharge Orders     None          Patsey Lot, MD 03/02/24 2319

## 2024-03-02 NOTE — ED Notes (Signed)
 Dup Neb started by EMS is complete and has been removed. Patient is now on 2L [place].

## 2024-03-02 NOTE — H&P (Signed)
 History and Physical    Tracy Park FMW:[id number] DOB: [birth year] DOA: 03/02/2024  PCP: Corlis Longs, FNP   Patient coming from: Home   Chief Complaint: SOB, cough  HPI: Tracy Park is a 68 y.o. female with medical history significant for hypertension, depression, anxiety, PAF on Eliquis , lung cancer s/p lobectomy, severe emphysema, chronic hypoxic and hypercarbic respiratory failure, recent admissions for COPD, and recent Pseudomonas in sputum who presents with worsening shortness of breath and cough.  Patient reports 2-3 days of worsening SOB and worsening cough with increased sputum production. She denies fevers, chest pain, or abdominal pain but complains of worsening in her chronic back pain that she attributes to coughing.   She was treated by EMS with Atrovent , albuterol , and IV Solu-Medrol  prior to arrival in the ED.  ED Course: Upon arrival to the ED, patient is found to be afebrile and saturating well on room air with normal RR, normal HR, and stable BP.  Labs are most notable for serum bicarbonate 37 and normal WBC.  Review of Systems:  All other systems reviewed and apart from HPI, are negative.  Past Medical History:  Diagnosis Date   Adenocarcinoma of lung (HCC) 02/26/2022   Anginal pain (HCC)    Anxiety    Bipolar disorder (HCC)    Cancer (HCC)    COPD (chronic obstructive pulmonary disease) (HCC)    Dyspnea    Family history of adverse reaction to anesthesia    History of depression 08/03/2023   History of kidney stones    Hydroureteronephrosis 08/16/2021   Hypothyroidism    Lung cancer (HCC)    Malignant neoplasm of upper lobe of left lung (HCC) 11/12/2016   Myocardial infarction (HCC)    Paroxysmal atrial fibrillation (HCC)    Paroxysmal atrial fibrillation with RVR (HCC) 01/25/2023   PTSD (post-traumatic stress disorder)    Rib pain on left side 06/24/2023   Sleep apnea    Thyroid  disease     Past Surgical History:  Procedure Laterality Date    ABDOMINAL HYSTERECTOMY     BACK SURGERY     CYSTOSCOPY W/ URETERAL STENT PLACEMENT Right 05/03/2021   Procedure: CYSTOSCOPY WITH RETROGRADE PYELOGRAM/URETERAL STENT PLACEMENT;  Surgeon: Cam Morene ORN, MD;  Location: [facility];  Service: Urology;  Laterality: Right;   EYE SURGERY     kidney stent     thyroidectomy      Social History:   reports that she has been smoking cigarettes. She has never used smokeless tobacco. She reports that she does not currently use alcohol . She reports that she does not currently use drugs.  Allergies  Allergen Reactions   Red Dye #40 (Allura Red) Hives, Itching and Other (See Comments)    Red food dye   Strawberry Extract Hives and Itching   Tomato Hives and Itching   Aspirin Hives   Tape Rash and Other (See Comments)    Prefers paper tape   Wound Dressing Adhesive Rash    Family History  Problem Relation Age of Onset   Hypertension Mother    Heart failure Mother    Hypertension Father    Diabetes Father    Heart failure Father      Prior to Admission medications   Medication Sig Start Date End Date Taking? Authorizing Provider  albuterol  (PROVENTIL ) (2.5 MG/3ML) 0.083% nebulizer solution Take 2.5 mg by nebulization every 4 (four) hours as needed for wheezing or shortness of breath.   Yes [provider]  albuterol  (  VENTOLIN  HFA) 108 (90 Base) MCG/ACT inhaler Inhale 2 puffs into the lungs every 6 (six) hours as needed for wheezing or shortness of breath. 11/07/23  Yes Amin, Ankit C, MD  levothyroxine  (SYNTHROID ) 125 MCG tablet Take 125 mcg by mouth daily before breakfast.   Yes [provider]  apixaban  (ELIQUIS ) 5 MG TABS tablet Take 1 tablet (5 mg total) by mouth 2 (two) times daily. 06/06/23   Barbarann Nest, MD  cyclobenzaprine  (FLEXERIL ) 10 MG tablet Take 1 tablet (10 mg total) by mouth 2 (two) times daily as needed for muscle spasms. 02/10/24   Dreama Longs, MD  lidocaine  (LIDODERM ) 5 % Place 1 patch onto the skin  daily. Remove & Discard patch within 12 hours or as directed by MD 02/10/24   Dreama Longs, MD  magnesium  oxide (MAG-OX) 400 (240 Mg) MG tablet Take 400 mg by mouth in the morning.    [provider]  metoprolol  succinate (TOPROL -XL) 25 MG 24 hr tablet Take 12.5 mg by mouth daily.    [provider]  nitroGLYCERIN  (NITROSTAT ) 0.4 MG SL tablet Place 0.4 mg under the tongue every 5 (five) minutes as needed for chest pain.    [provider]  oxyCODONE  (ROXICODONE ) 5 MG immediate release tablet Take 1 tablet (5 mg total) by mouth every 4 (four) hours as needed for severe pain (pain score 7-10). 02/10/24   Dreama Longs, MD  oxyCODONE -acetaminophen  (PERCOCET/ROXICET) 5-325 MG tablet Take 1 tablet by mouth every 6 (six) hours as needed for severe pain (pain score 7-10). 02/16/24   Randol Simmonds, MD  OXYGEN  Inhale 2 L/min into the lungs continuous.    [provider]  polyvinyl alcohol  (LIQUIFILM TEARS) 1.4 % ophthalmic solution Place 1 drop into both eyes in the morning, at noon, in the evening, and at bedtime.    [provider]  prednisoLONE  acetate (PRED FORTE ) 1 % ophthalmic suspension Place 1 drop into the left eye 4 (four) times daily. 11/19/23   [provider]  rosuvastatin  (CRESTOR ) 20 MG tablet Take 20 mg by mouth daily.    [provider]  sertraline  (ZOLOFT ) 25 MG tablet Take 25 mg by mouth in the morning.    [provider]  SYSTANE COMPLETE PF 0.6 % SOLN Place 1 drop into both eyes 4 (four) times daily as needed.    [provider]    Physical Exam: Vitals:   03/02/24 2040 03/02/24 2042 03/02/24 2045  BP:   121/83  Pulse:   94  Resp:   15  Temp:   98.5 F (36.9 C)  TempSrc:   Oral  SpO2: 100% 100% 100%  Weight:  49.9 kg   Height:  5' 3 (1.6 m)     Constitutional: NAD, no pallor or diaphoresis   Eyes: PERTLA, lids and conjunctivae normal ENMT: Mucous membranes are moist. Posterior pharynx clear of any  exudate or lesions.   Neck: supple, no masses  Respiratory: Speaking full sentences. Diminished bilaterally with prolonged expiratory phase.  Cardiovascular: S1 & S2 heard, regular rate and rhythm. No extremity edema.   Abdomen: No tenderness, soft. Bowel sounds active.  Musculoskeletal: no clubbing / cyanosis. No joint deformity upper and lower extremities.   Skin: no significant rashes, lesions, ulcers. Warm, dry, well-perfused. Neurologic: CN 2-12 grossly intact. Moving all extremities. Alert and oriented.  Psychiatric: Calm. Cooperative.    Labs and Imaging on Admission: I have personally reviewed following labs and imaging studies  CBC: Recent Labs  Lab 03/02/24 2045  WBC 9.4  HGB 12.6  HCT 41.7  MCV 96.1  PLT 241   Basic Metabolic Panel: Recent Labs  Lab 03/02/24 2045  NA 138  K 3.5  CL 91*  CO2 37*  GLUCOSE 165*  BUN 9  CREATININE 0.54  CALCIUM  8.9   GFR: Estimated Creatinine Clearance: 55.2 mL/min (by C-G formula based on SCr of 0.54 mg/dL). Liver Function Tests: No results for input(s): AST, ALT, ALKPHOS, BILITOT, PROT, ALBUMIN in the last 168 hours. No results for input(s): LIPASE, AMYLASE in the last 168 hours. No results for input(s): AMMONIA in the last 168 hours. Coagulation Profile: No results for input(s): INR, PROTIME in the last 168 hours. Cardiac Enzymes: No results for input(s): CKTOTAL, CKMB, CKMBINDEX, TROPONINI in the last 168 hours. BNP (last 3 results) No results for input(s): PROBNP in the last 8760 hours. HbA1C: No results for input(s): HGBA1C in the last 72 hours. CBG: No results for input(s): GLUCAP in the last 168 hours. Lipid Profile: No results for input(s): CHOL, HDL, LDLCALC, TRIG, CHOLHDL, LDLDIRECT in the last 72 hours. Thyroid  Function Tests: No results for input(s): TSH, T4TOTAL, FREET4, T3FREE, THYROIDAB in the last 72 hours. Anemia Panel: No results for input(s):  VITAMINB12, FOLATE, FERRITIN, TIBC, IRON , RETICCTPCT in the last 72 hours. Urine analysis:    Component Value Date/Time   COLORURINE YELLOW 06/25/2023 0536   APPEARANCEUR CLEAR 06/25/2023 0536   LABSPEC 1.023 06/25/2023 0536   PHURINE 6.0 06/25/2023 0536   GLUCOSEU 50 (A) 06/25/2023 0536   HGBUR SMALL (A) 06/25/2023 0536   BILIRUBINUR NEGATIVE 06/25/2023 0536   KETONESUR NEGATIVE 06/25/2023 0536   PROTEINUR NEGATIVE 06/25/2023 0536   UROBILINOGEN 0.2 11/06/2009 1559   NITRITE NEGATIVE 06/25/2023 0536   LEUKOCYTESUR NEGATIVE 06/25/2023 0536   Sepsis Labs: @LABRCNTIP (procalcitonin:4,lacticidven:4) ) Recent Results (from the past 240 hours)  Resp panel by RT-PCR (RSV, Flu A&B, Covid) Anterior Nasal Swab     Status: None   Collection Time: 03/02/24  8:43 PM   Specimen: Anterior Nasal Swab  Result Value Ref Range Status   SARS Coronavirus 2 by RT PCR NEGATIVE NEGATIVE Final    Comment: (NOTE) SARS-CoV-2 target nucleic acids are NOT DETECTED.  The SARS-CoV-2 RNA is generally detectable in upper respiratory specimens during the acute phase of infection. The lowest concentration of SARS-CoV-2 viral copies this assay can detect is 138 copies/mL. A negative result does not preclude SARS-Cov-2 infection and should not be used as the sole basis for treatment or other patient management decisions. A negative result may occur with  improper specimen collection/handling, submission of specimen other than nasopharyngeal swab, presence of viral mutation(s) within the areas targeted by this assay, and inadequate number of viral copies(<138 copies/mL). A negative result must be combined with clinical observations, patient history, and epidemiological information. The expected result is Negative.  Fact Sheet for Patients:  BloggerCourse.com  Fact Sheet for Healthcare Providers:  SeriousBroker.it  This test is no t yet approved  or cleared by the [place]  FDA and  has been authorized for detection and/or diagnosis of SARS-CoV-2 by FDA under an Emergency Use Authorization (EUA). This EUA will remain  in effect (meaning this test can be used) for the duration of the COVID-19 declaration under Section 564(b)(1) of the Act, 21 U.S.C.section 360bbb-3(b)(1), unless the authorization is terminated  or revoked sooner.       Influenza A by PCR NEGATIVE NEGATIVE Final   Influenza B by PCR NEGATIVE NEGATIVE Final  Comment: (NOTE) The Xpert Xpress SARS-CoV-2/FLU/RSV plus assay is intended as an aid in the diagnosis of influenza from Nasopharyngeal swab specimens and should not be used as a sole basis for treatment. Nasal washings and aspirates are unacceptable for Xpert Xpress SARS-CoV-2/FLU/RSV testing.  Fact Sheet for Patients: BloggerCourse.com  Fact Sheet for Healthcare Providers: SeriousBroker.it  This test is not yet approved or cleared by the [place]  FDA and has been authorized for detection and/or diagnosis of SARS-CoV-2 by FDA under an Emergency Use Authorization (EUA). This EUA will remain in effect (meaning this test can be used) for the duration of the COVID-19 declaration under Section 564(b)(1) of the Act, 21 U.S.C. section 360bbb-3(b)(1), unless the authorization is terminated or revoked.     Resp Syncytial Virus by PCR NEGATIVE NEGATIVE Final    Comment: (NOTE) Fact Sheet for Patients: BloggerCourse.com  Fact Sheet for Healthcare Providers: SeriousBroker.it  This test is not yet approved or cleared by the [place]  FDA and has been authorized for detection and/or diagnosis of SARS-CoV-2 by FDA under an Emergency Use Authorization (EUA). This EUA will remain in effect (meaning this test can be used) for the duration of the COVID-19 declaration under Section 564(b)(1) of the Act,  21 U.S.C. section 360bbb-3(b)(1), unless the authorization is terminated or revoked.  Performed at [facility], [address], [place], [place] [address]      Radiological Exams on Admission: DG Chest 2 View Result Date: 03/02/2024 CLINICAL DATA:  Increased shortness of breath EXAM: CHEST - 2 VIEW COMPARISON:  02/16/2024 FINDINGS: Cardiac shadow is stable. Postsurgical changes are noted in the left lung with apical scarring. No acute infiltrate or effusion is seen. No bony abnormality is noted IMPRESSION: Chronic changes in the left apex.  No acute abnormality noted. Electronically Signed   By: Oneil Devonshire M.D.   On: 03/02/2024 21:10    EKG: Independently reviewed. Sinus rhythm, PVC, LVH.   Assessment/Plan   1. COPD exacerbation; chronic hypoxic & hypercarbic respiratory failure  - Culture sputum, start cefepime , continue systemic steroids, continue short-acting bronchodilators, continue supplemental O2 as-needed    2. PAF  - Continue Eliquis , metoprolol    3. Depression - Zoloft    4. Hypothyroidism  - Synthroid      DVT prophylaxis: Eliquis   Code Status: DNR/DNI  Level of Care: Level of care: Med-Surg Family Communication: None present   Disposition Plan:  Patient is from: Home  Anticipated d/c is to: Home Anticipated d/c date is: 03/04/24  Patient currently: Pending improved respiratory status  Consults called: None  Admission status: Home     Evalene GORMAN Sprinkles, MD [facility]  03/02/2024, 11:38 PM

## 2024-03-02 NOTE — ED Triage Notes (Signed)
 Patient is from home via ems for shortness of breath. Hx of emphysema, asthma, stage 3 lung cancer-remission, and lobectomy. Patient states her breathing feels worse and the tele-nurse she spoke with was concerned about pneumonia. No thinners. 2LNC of oxygen  at baseline. Arrives with duo neb in process with EMS (atrovent  and albuterol ), 125mg  of  slumedrol. Patient was wheezing on seen. 20G IV Left wrist.

## 2024-03-03 DIAGNOSIS — J441 Chronic obstructive pulmonary disease with (acute) exacerbation: Secondary | ICD-10-CM | POA: Diagnosis not present

## 2024-03-03 LAB — BASIC METABOLIC PANEL WITH GFR
Anion gap: 12 (ref 5–15)
BUN: 10 mg/dL (ref 8–23)
CO2: 34 mmol/L — ABNORMAL HIGH (ref 22–32)
Calcium: 9 mg/dL (ref 8.9–10.3)
Chloride: 90 mmol/L — ABNORMAL LOW (ref 98–111)
Creatinine, Ser: 0.55 mg/dL (ref 0.44–1.00)
GFR, Estimated: >60 mL/min (ref >60)
Glucose, Bld: 218 mg/dL — ABNORMAL HIGH (ref 70–99)
Potassium: 4.3 mmol/L (ref 3.5–5.1)
Sodium: 136 mmol/L (ref 135–145)

## 2024-03-03 LAB — CBC
HCT: 43.5 % (ref 36.0–46.0)
Hemoglobin: 13.1 g/dL (ref 12.0–15.0)
MCH: 28.9 pg (ref 26.0–34.0)
MCHC: 30.1 g/dL (ref 30.0–36.0)
MCV: 95.8 fL (ref 80.0–100.0)
Platelets: 244 10*3/uL (ref 150–400)
RBC: 4.54 MIL/uL (ref 3.87–5.11)
RDW: 14.1 % (ref 11.5–15.5)
WBC: 6.3 10*3/uL (ref 4.0–10.5)
nRBC: 0 % (ref 0.0–0.2)

## 2024-03-03 LAB — EXPECTORATED SPUTUM ASSESSMENT W GRAM STAIN, RFLX TO RESP C

## 2024-03-03 LAB — MAGNESIUM: Magnesium: 1.9 mg/dL (ref 1.7–2.4)

## 2024-03-03 MED ORDER — SODIUM CHLORIDE 0.9 % IV SOLN
2.0000 g | Freq: Two times a day (BID) | INTRAVENOUS | Status: AC
  Administered 2024-03-03 – 2024-03-07 (×10): 2 g via INTRAVENOUS
  Filled 2024-03-03 (×10): qty 12.5

## 2024-03-03 MED ORDER — ENSURE PLUS HIGH PROTEIN PO LIQD
237.0000 mL | Freq: Two times a day (BID) | ORAL | Status: DC
  Administered 2024-03-03 – 2024-03-08 (×12): 237 mL via ORAL

## 2024-03-03 MED ORDER — GUAIFENESIN 100 MG/5ML PO LIQD: 5.0000 mL | ORAL | Status: DC | PRN

## 2024-03-03 MED ORDER — SODIUM CHLORIDE 0.9 % IV SOLN: INTRAVENOUS | Status: AC | PRN

## 2024-03-03 MED ORDER — IPRATROPIUM-ALBUTEROL 0.5-2.5 (3) MG/3ML IN SOLN
3.0000 mL | Freq: Three times a day (TID) | RESPIRATORY_TRACT | Status: DC
  Administered 2024-03-03 – 2024-03-04 (×2): 3 mL via RESPIRATORY_TRACT
  Filled 2024-03-03 (×2): qty 3

## 2024-03-03 NOTE — Progress Notes (Signed)
 Tracy Park [address] [facility] Liaison note   Marcene Laskowski is a current hospice patient with a terminal diagnosis of chronic obstructive pulmonary disease with acute exacerbation. Daughter contacted [facility] to advise patient was having pain in her chest, coughing and similar symptoms to previous pneumonia episode. Nurse visit was offered and declined. Daughter activated EMS for evaluation in the ED. Patient was admitted to [facility] 6.25.25 for COPD exacerbation. Per Dr. Norleen Laurence with [facility], this is a related hospital admission. Patient is a DNR.   Met with patient at bedside. She was napping but easily awakened and advised she was beginning to feel better with improved breathing and shortness of breath symptoms. She stated that she hopes to discharge home tomorrow. No staff available for report.    Pt is inpatient appropriate due to treatment of COPD exacerbation and IV medication.  VS: 98/80/18    120/76    99% on O2 2 lpm [place]   I/O: 480/not documented   Abnormal Labs:  03/02/24 20:45 Basic metabolic panel with GFR:  Chloride: 91 (L) CO2: 37 (H) Glucose: 165 (H)  03/03/24 03:34 Basic metabolic panel with GFR:  Chloride: 90 (L) CO2: 34 (H) Glucose: 218 (H)   Diagnostics:  EXAM: CHEST - 2 VIEW   COMPARISON:  02/16/2024   FINDINGS: Cardiac shadow is stable. Postsurgical changes are noted in the left lung with apical scarring. No acute infiltrate or effusion is seen. No bony abnormality is noted   IMPRESSION: Chronic changes in the left apex.  No acute abnormality noted.   IV/PRN meds:  Flexeril  10mg  PO x1, Cefepime  2g IV x2, Solu-Medrol  125mg  IV x1   Assessment and Plan (per Jillian Buttery, MD 6.25.25): Assessment & Plan:   Principal Problem:   COPD with acute exacerbation Active Problems:   Chronic respiratory failure with hypoxia and hypercapnia (HCC)   Anxiety and depression   History of lung cancer   Essential  hypertension   Paroxysmal atrial fibrillation (HCC)   Acute on chronic respiratory failure with hypoxia and hypercapnia (HCC)   Obstructive sleep apnea (adult) (pediatric)   Acute COPD exacerbation: Presented with cough, shortness of breath.  On 2L of oxygen  at home.  Required 2 L of oxygen  on presentation. Currently on IV Solu-Medrol .  Will start on prednisone  from tomorrow.  Continue bronchodilators.  Continue cefepime  for now for recurrent COPD exacerbation.  No evidence of pneumonia as per chest x-ray on presentation She follows with pulmonology at [facility].   History of lung cancer: status post left upper lobectomy for the history of adenocarcinoma   Paroxysmal A-fib: On Eliquis , metoprolol  at home.  Currently in normal sinus rhythm.  Apparently she stopped Eliquis  2 days ago as recommended by outpatient hospice   Depression: On Zoloft    Hypothyroidism: On Synthyroid   Tobacco abuse: Smokes 5 cigarettes a day.  Counseled for cessation   Discharge Planning: Ongoing, possibly tomorrow if medically stable.   Family Contact: pt speaks for herself, left message for daughter   IDT: updated   Goals of Care: DNR   Should patient need ambulance transport at discharge, please call [facility] as we contract with them for our active hospice patients.   Please call with any hospice questions or concerns.   Thank you, Eleanor Nail, [facility] Liaison [phone]

## 2024-03-03 NOTE — Progress Notes (Addendum)
 PROGRESS NOTE  Tracy Park  FMW:[id number] DOB: 06-Sep-1956 DOA: 03/02/2024 PCP: Corlis Longs, FNP   Brief Narrative: Patient is a 68 year old female with history of hypertension, depression, anxiety, paroxysmal A-fib on Eliquis , lung cancer status postlumpectomy, severe COPD with multiple admissions, chronic hypoxic/hypercarbic respiratory failure who presented with worsening shortness of breath and cough, increased sputum production.  In the emergency department, she was treated with Atrovent /albuterol /Solu-Medrol .  Currently being managed for COPD exacerbation.  Assessment & Plan:  Principal Problem:   COPD with acute exacerbation Active Problems:   Chronic respiratory failure with hypoxia and hypercapnia (HCC)   Anxiety and depression   History of lung cancer   Essential hypertension   Paroxysmal atrial fibrillation (HCC)   Acute on chronic respiratory failure with hypoxia and hypercapnia (HCC)   Obstructive sleep apnea (adult) (pediatric)  Acute COPD exacerbation: Presented with cough, shortness of breath.  On 2L of oxygen  at home.  Required 2 L of oxygen  on presentation. Currently on IV Solu-Medrol .  Will start on prednisone  from tomorrow.  Continue bronchodilators.  Continue cefepime  for now for recurrent COPD exacerbation.  No evidence of pneumonia as per chest x-ray on presentation She follows with pulmonology at [facility].  History of lung cancer: status post left upper lobectomy for the history of adenocarcinoma  Paroxysmal A-fib: On Eliquis , metoprolol  at home.  Currently in normal sinus rhythm.  Apparently she stopped Eliquis  2 days ago as recommended by outpatient hospice  Depression: On Zoloft   Hypothyroidism: On Synthyroid  Tobacco abuse: Smokes 5 cigarettes a day.  Counseled for cessation         DVT prophylaxis: apixaban  (ELIQUIS ) tablet 5 mg     Code Status: Limited: Do not attempt resuscitation (DNR) -DNR-LIMITED -Do Not Intubate/DNI   Family  Communication: None at the bedside  Patient status: Inpatient  Patient is from : Home  Anticipated discharge to: Home  Estimated DC date: 1 to 2 days   Consultants: None  Procedures: None  Antimicrobials:  Anti-infectives (From admission, onward)    Start     Dose/Rate Route Frequency Ordered Stop   03/03/24 1200  ceFEPIme  (MAXIPIME ) 2 g in sodium chloride  0.9 % 100 mL IVPB        2 g 200 mL/hr over 30 Minutes Intravenous Every 12 hours 03/03/24 0128     03/02/24 2345  ceFEPIme  (MAXIPIME ) 2 g in sodium chloride  0.9 % 100 mL IVPB        2 g 200 mL/hr over 30 Minutes Intravenous  Once 03/02/24 2342 03/03/24 0149       Subjective: Patient seen and examined at the bedside today.  Hemodynamically stable lying in bed.  On 2 L of oxygen  per minute which is her baseline.  Still has some cough and shortness of breath.  Has wheezing on auscultation.  Still smoking 5 cigarettes a day.  Ambulates with the help of walker  Objective: Vitals:   03/02/24 2045 03/03/24 0121 03/03/24 0326 03/03/24 0946  BP: 121/83 119/82 110/73 109/72  Pulse: 94 81 78 76  Resp: 15 16 15 18   Temp: 98.5 F (36.9 C) 97.8 F (36.6 C) (!) 97.5 F (36.4 C) 97.9 F (36.6 C)  TempSrc: Oral Oral Oral   SpO2: 100% 97% 97% 98%  Weight:      Height:        Intake/Output Summary (Last 24 hours) at 03/03/2024 1030 Last data filed at 03/03/2024 0600 Gross per 24 hour  Intake 480 ml  Output --  Net 480 ml   Filed Weights   03/02/24 2042  Weight: 49.9 kg    Examination:  General exam: Overall comfortable, not in distress, very deconditioned, debilitated, thin built HEENT: PERRL Respiratory system: Diminished sounds, bilateral scattered wheezing Cardiovascular system: S1 & S2 heard, RRR.  Gastrointestinal system: Abdomen is nondistended, soft and nontender. Central nervous system: Alert and oriented Extremities: No edema, no clubbing ,no cyanosis Skin: No rashes, no ulcers,no icterus     Data  Reviewed: I have personally reviewed following labs and imaging studies  CBC: Recent Labs  Lab 03/02/24 2045 03/03/24 0334  WBC 9.4 6.3  HGB 12.6 13.1  HCT 41.7 43.5  MCV 96.1 95.8  PLT 241 244   Basic Metabolic Panel: Recent Labs  Lab 03/02/24 2045 03/03/24 0334  NA 138 136  K 3.5 4.3  CL 91* 90*  CO2 37* 34*  GLUCOSE 165* 218*  BUN 9 10  CREATININE 0.54 0.55  CALCIUM  8.9 9.0  MG  --  1.9     Recent Results (from the past 240 hours)  Resp panel by RT-PCR (RSV, Flu A&B, Covid) Anterior Nasal Swab     Status: None   Collection Time: 03/02/24  8:43 PM   Specimen: Anterior Nasal Swab  Result Value Ref Range Status   SARS Coronavirus 2 by RT PCR NEGATIVE NEGATIVE Final    Comment: (NOTE) SARS-CoV-2 target nucleic acids are NOT DETECTED.  The SARS-CoV-2 RNA is generally detectable in upper respiratory specimens during the acute phase of infection. The lowest concentration of SARS-CoV-2 viral copies this assay can detect is 138 copies/mL. A negative result does not preclude SARS-Cov-2 infection and should not be used as the sole basis for treatment or other patient management decisions. A negative result may occur with  improper specimen collection/handling, submission of specimen other than nasopharyngeal swab, presence of viral mutation(s) within the areas targeted by this assay, and inadequate number of viral copies(<138 copies/mL). A negative result must be combined with clinical observations, patient history, and epidemiological information. The expected result is Negative.  Fact Sheet for Patients:  BloggerCourse.com  Fact Sheet for Healthcare Providers:  SeriousBroker.it  This test is no t yet approved or cleared by the [place]  FDA and  has been authorized for detection and/or diagnosis of SARS-CoV-2 by FDA under an Emergency Use Authorization (EUA). This EUA will remain  in effect (meaning this test  can be used) for the duration of the COVID-19 declaration under Section 564(b)(1) of the Act, 21 U.S.C.section 360bbb-3(b)(1), unless the authorization is terminated  or revoked sooner.       Influenza A by PCR NEGATIVE NEGATIVE Final   Influenza B by PCR NEGATIVE NEGATIVE Final    Comment: (NOTE) The Xpert Xpress SARS-CoV-2/FLU/RSV plus assay is intended as an aid in the diagnosis of influenza from Nasopharyngeal swab specimens and should not be used as a sole basis for treatment. Nasal washings and aspirates are unacceptable for Xpert Xpress SARS-CoV-2/FLU/RSV testing.  Fact Sheet for Patients: BloggerCourse.com  Fact Sheet for Healthcare Providers: SeriousBroker.it  This test is not yet approved or cleared by the [place]  FDA and has been authorized for detection and/or diagnosis of SARS-CoV-2 by FDA under an Emergency Use Authorization (EUA). This EUA will remain in effect (meaning this test can be used) for the duration of the COVID-19 declaration under Section 564(b)(1) of the Act, 21 U.S.C. section 360bbb-3(b)(1), unless the authorization is terminated or revoked.     Resp Syncytial Virus by  PCR NEGATIVE NEGATIVE Final    Comment: (NOTE) Fact Sheet for Patients: BloggerCourse.com  Fact Sheet for Healthcare Providers: SeriousBroker.it  This test is not yet approved or cleared by the [place]  FDA and has been authorized for detection and/or diagnosis of SARS-CoV-2 by FDA under an Emergency Use Authorization (EUA). This EUA will remain in effect (meaning this test can be used) for the duration of the COVID-19 declaration under Section 564(b)(1) of the Act, 21 U.S.C. section 360bbb-3(b)(1), unless the authorization is terminated or revoked.  Performed at [facility], [address] [place], [place] [address]   Expectorated Sputum  Assessment w Gram Stain, Rflx to Resp Cult     Status: None   Collection Time: 03/03/24  2:42 AM   Specimen: Sputum  Result Value Ref Range Status   Specimen Description SPUTUM  Final   Special Requests NONE  Final   Sputum evaluation   Final    THIS SPECIMEN IS ACCEPTABLE FOR SPUTUM CULTURE Performed at [facility], [address], [place], [place] [address]    Report Status 03/03/2024 FINAL  Final     Radiology Studies: DG Chest 2 View Result Date: 03/02/2024 CLINICAL DATA:  Increased shortness of breath EXAM: CHEST - 2 VIEW COMPARISON:  02/16/2024 FINDINGS: Cardiac shadow is stable. Postsurgical changes are noted in the left lung with apical scarring. No acute infiltrate or effusion is seen. No bony abnormality is noted IMPRESSION: Chronic changes in the left apex.  No acute abnormality noted. Electronically Signed   By: Oneil Devonshire M.D.   On: 03/02/2024 21:10    Scheduled Meds:  apixaban   5 mg Oral BID   feeding supplement  237 mL Oral BID BM   ipratropium-albuterol   3 mL Nebulization TID   levothyroxine   125 mcg Oral Q0600   methylPREDNISolone  (SOLU-MEDROL ) injection  125 mg Intravenous Q12H   Followed by   NOREEN ON 03/04/2024] predniSONE   40 mg Oral Q breakfast   metoprolol  succinate  12.5 mg Oral Daily   rosuvastatin   20 mg Oral Daily   sertraline   25 mg Oral Daily   Continuous Infusions:  ceFEPime  (MAXIPIME ) IV       LOS: 1 day   Ivonne Mustache, MD [facility] P6/25/2025, 10:30 AM

## 2024-03-03 NOTE — Plan of Care (Signed)

## 2024-03-04 DIAGNOSIS — J441 Chronic obstructive pulmonary disease with (acute) exacerbation: Secondary | ICD-10-CM | POA: Diagnosis not present

## 2024-03-04 LAB — CBC
HCT: 39.2 % (ref 36.0–46.0)
Hemoglobin: 12.1 g/dL (ref 12.0–15.0)
MCH: 29.7 pg (ref 26.0–34.0)
MCHC: 30.9 g/dL (ref 30.0–36.0)
MCV: 96.1 fL (ref 80.0–100.0)
Platelets: 257 10*3/uL (ref 150–400)
RBC: 4.08 MIL/uL (ref 3.87–5.11)
RDW: 14 % (ref 11.5–15.5)
WBC: 9.7 10*3/uL (ref 4.0–10.5)
nRBC: 0 % (ref 0.0–0.2)

## 2024-03-04 LAB — BASIC METABOLIC PANEL WITH GFR
Anion gap: 10 (ref 5–15)
BUN: 20 mg/dL (ref 8–23)
CO2: 31 mmol/L (ref 22–32)
Calcium: 9 mg/dL (ref 8.9–10.3)
Chloride: 98 mmol/L (ref 98–111)
Creatinine, Ser: 0.82 mg/dL (ref 0.44–1.00)
GFR, Estimated: >60 mL/min (ref >60)
Glucose, Bld: 212 mg/dL — ABNORMAL HIGH (ref 70–99)
Potassium: 4.2 mmol/L (ref 3.5–5.1)
Sodium: 139 mmol/L (ref 135–145)

## 2024-03-04 MED ORDER — REVEFENACIN 175 MCG/3ML IN SOLN
175.0000 ug | Freq: Every day | RESPIRATORY_TRACT | Status: DC
  Administered 2024-03-04 – 2024-03-08 (×5): 175 ug via RESPIRATORY_TRACT
  Filled 2024-03-04 (×5): qty 3

## 2024-03-04 MED ORDER — GUAIFENESIN ER 600 MG PO TB12
600.0000 mg | ORAL_TABLET | Freq: Two times a day (BID) | ORAL | Status: DC
  Administered 2024-03-04 – 2024-03-08 (×9): 600 mg via ORAL
  Filled 2024-03-04 (×9): qty 1

## 2024-03-04 MED ORDER — BUDESONIDE 0.5 MG/2ML IN SUSP
0.5000 mg | Freq: Two times a day (BID) | RESPIRATORY_TRACT | Status: DC
  Administered 2024-03-04 – 2024-03-08 (×9): 0.5 mg via RESPIRATORY_TRACT
  Filled 2024-03-04 (×9): qty 2

## 2024-03-04 MED ORDER — HYDROCOD POLI-CHLORPHE POLI ER 10-8 MG/5ML PO SUER
5.0000 mL | Freq: Two times a day (BID) | ORAL | Status: DC
  Administered 2024-03-04 – 2024-03-08 (×9): 5 mL via ORAL
  Filled 2024-03-04 (×9): qty 5

## 2024-03-04 MED ORDER — METHYLPREDNISOLONE SODIUM SUCC 40 MG IJ SOLR
40.0000 mg | Freq: Two times a day (BID) | INTRAMUSCULAR | Status: AC
  Administered 2024-03-04 – 2024-03-07 (×8): 40 mg via INTRAVENOUS
  Filled 2024-03-04 (×8): qty 1

## 2024-03-04 MED ORDER — ARFORMOTEROL TARTRATE 15 MCG/2ML IN NEBU
15.0000 ug | INHALATION_SOLUTION | Freq: Two times a day (BID) | RESPIRATORY_TRACT | Status: DC
  Administered 2024-03-04 – 2024-03-08 (×9): 15 ug via RESPIRATORY_TRACT
  Filled 2024-03-04 (×10): qty 2

## 2024-03-04 NOTE — Progress Notes (Signed)
    Tracy Park [address] [facility] Liaison note   Tracy Park is a current hospice patient with a terminal diagnosis of chronic obstructive pulmonary disease with acute exacerbation. Daughter contacted [facility] to advise patient was having pain in her chest, coughing and similar symptoms to previous pneumonia episode. Nurse visit was offered and declined. Daughter activated EMS for evaluation in the ED. Patient was admitted to [facility] 6.25.25 for COPD exacerbation. Per Dr. Norleen Park with [facility], this is a related hospital admission. Patient is a DNR.   Met with patient at bedside. She was resting comfortably but said that she was not feeling well today and feels worse today than yesterday. She reports having breathing issues today. She advised that her MD wants her to stay a couple more days but she is hopeful to get home by the weekend.    Pt is inpatient appropriate due to treatment of COPD exacerbation and IV medication.   VS: 98.7/85/18    100/66    98% on O2 2.5 lpm [place]   I/O: 1560/450   Abnormal Labs:  03/04/24 03:23 Glucose: 212 (H)  Diagnostics: None new  IV/PRN meds:  Flexeril  10mg  PO x1, Cefepime  2g IV x2, oxycodone  5mg  PO x1   Assessment and Plan (per Tracy Buttery, MD 6.26.25): Assessment & Plan:   Principal Problem:   COPD with acute exacerbation Active Problems:   Chronic respiratory failure with hypoxia and hypercapnia (HCC)   Anxiety and depression   History of lung cancer   Essential hypertension   Paroxysmal atrial fibrillation (HCC)   Acute on chronic respiratory failure with hypoxia and hypercapnia (HCC)   Obstructive sleep apnea (adult) (pediatric)   Acute COPD exacerbation: Presented with cough, shortness of breath.  On 2L of oxygen  at home.  Required 2 L of oxygen  on presentation. Currently on IV Solu-Medrol .  Continues to have wheezing today and dry cough.  Started on Brovana , Yupelri .  Continue Pulmicort .  Continue  cefepime  for now for recurrent COPD exacerbation.  No evidence of pneumonia as per chest x-ray on presentation.Added Tussionex She follows with pulmonology at [facility].   History of lung cancer: status post left upper lobectomy for the history of adenocarcinoma.  Currently enrolled in hospice.   Paroxysmal A-fib: On Eliquis , metoprolol  at home.  Currently in normal sinus rhythm.     Depression: On Zoloft    Hypothyroidism: On Synthyroid   Tobacco abuse: Smokes 5 cigarettes a day.  Counseled for cessation   Deconditioning: PT consulted    DVT prophylaxis: apixaban  (ELIQUIS ) tablet 5 mg   Discharge Planning: Ongoing, next 1-2 days   Family Contact: pt speaks for herself, attempted to call pt's daughter but phone was disconnected   IDT: updated   Goals of Care: DNR   Should patient need ambulance transport at discharge, please call [facility] as we contract with them for our active hospice patients.   Please call with any hospice questions or concerns.   Thank you, Tracy Park, [facility] Liaison [phone]

## 2024-03-04 NOTE — Progress Notes (Signed)
   03/04/24 1448  TOC Brief Assessment  Insurance and Status Reviewed  Patient has primary care physician Yes  Home environment has been reviewed home with family  Prior level of function: independent overall  Prior/Current Home Services No current home services  Social Drivers of Health Review SDOH reviewed no interventions necessary  Readmission risk has been reviewed Yes  Transition of care needs no transition of care needs at this time   Met with pt who confirms she has her travel tank in the room - ready for her when discharged.  She is hopeful family will be able to provide dc transportation when she is ready, however, TOC has assisted with taxi with prior dc's - will  monitor.

## 2024-03-04 NOTE — Progress Notes (Signed)
 PROGRESS NOTE  Tracy Park  FMW:[id number] DOB: 03/16/1956 DOA: 03/02/2024 PCP: Corlis Longs, FNP   Brief Narrative: Patient is a 68 year old female with history of hypertension, depression, anxiety, paroxysmal A-fib on Eliquis , lung cancer status postlumpectomy, severe COPD with multiple admissions now enrolled with outpatient hospice, chronic hypoxic/hypercarbic respiratory failure who presented with worsening shortness of breath and cough, increased sputum production.  In the emergency department, she was treated with Atrovent /albuterol /Solu-Medrol .  Currently being managed for COPD exacerbation.  Assessment & Plan:  Principal Problem:   COPD with acute exacerbation Active Problems:   Chronic respiratory failure with hypoxia and hypercapnia (HCC)   Anxiety and depression   History of lung cancer   Essential hypertension   Paroxysmal atrial fibrillation (HCC)   Acute on chronic respiratory failure with hypoxia and hypercapnia (HCC)   Obstructive sleep apnea (adult) (pediatric)  Acute COPD exacerbation: Presented with cough, shortness of breath.  On 2L of oxygen  at home.  Required 2 L of oxygen  on presentation. Currently on IV Solu-Medrol .  Continues to have wheezing today and dry cough.  Started on Brovana , Yupelri .  Continue Pulmicort .  Continue cefepime  for now for recurrent COPD exacerbation.  No evidence of pneumonia as per chest x-ray on presentation.Added Tussionex She follows with pulmonology at [facility].  History of lung cancer: status post left upper lobectomy for the history of adenocarcinoma.  Currently enrolled in hospice.  Paroxysmal A-fib: On Eliquis , metoprolol  at home.  Currently in normal sinus rhythm.    Depression: On Zoloft   Hypothyroidism: On Synthyroid  Tobacco abuse: Smokes 5 cigarettes a day.  Counseled for cessation  Deconditioning: PT consulted         DVT prophylaxis: apixaban  (ELIQUIS ) tablet 5 mg     Code Status: Limited: Do not attempt  resuscitation (DNR) -DNR-LIMITED -Do Not Intubate/DNI   Family Communication: None at the bedside  Patient status: Inpatient  Patient is from : Home  Anticipated discharge to: Home  Estimated DC date: 1 to 2 days   Consultants: None  Procedures: None  Antimicrobials:  Anti-infectives (From admission, onward)    Start     Dose/Rate Route Frequency Ordered Stop   03/03/24 1200  ceFEPIme  (MAXIPIME ) 2 g in sodium chloride  0.9 % 100 mL IVPB        2 g 200 mL/hr over 30 Minutes Intravenous Every 12 hours 03/03/24 0128     03/02/24 2345  ceFEPIme  (MAXIPIME ) 2 g in sodium chloride  0.9 % 100 mL IVPB        2 g 200 mL/hr over 30 Minutes Intravenous  Once 03/02/24 2342 03/03/24 0149       Subjective: Patient seen and examined at bedside today.  Hemodynamically stable.  Lying in bed.  Continues to have wheezing today.  Also bothered by dry cough.  At baseline oxygen  requirement.  Objective: Vitals:   03/03/24 2114 03/04/24 0538 03/04/24 0840 03/04/24 0847  BP: 101/62 119/79 108/75   Pulse: 84 78 87   Resp: 18 16    Temp: 98.2 F (36.8 C) 98.4 F (36.9 C)    TempSrc: Oral Oral    SpO2: 99% 100% 100% 98%  Weight:      Height:        Intake/Output Summary (Last 24 hours) at 03/04/2024 1101 Last data filed at 03/04/2024 0842 Gross per 24 hour  Intake 1440 ml  Output 450 ml  Net 990 ml   Filed Weights   03/02/24 2042  Weight: 49.9 kg  Examination:  General exam: Overall comfortable, not in distress, very deconditioned, debilitated, thin built HEENT: PERRL Respiratory system: Diminished air sounds, bilateral scattered wheezing Cardiovascular system: S1 & S2 heard, RRR.  Gastrointestinal system: Abdomen is nondistended, soft and nontender. Central nervous system: Alert and oriented Extremities: No edema, no clubbing ,no cyanosis Skin: No rashes, no ulcers,no icterus     Data Reviewed: I have personally reviewed following labs and imaging studies  CBC: Recent  Labs  Lab 03/02/24 2045 03/03/24 0334 03/04/24 0323  WBC 9.4 6.3 9.7  HGB 12.6 13.1 12.1  HCT 41.7 43.5 39.2  MCV 96.1 95.8 96.1  PLT 241 244 257   Basic Metabolic Panel: Recent Labs  Lab 03/02/24 2045 03/03/24 0334 03/04/24 0323  NA 138 136 139  K 3.5 4.3 4.2  CL 91* 90* 98  CO2 37* 34* 31  GLUCOSE 165* 218* 212*  BUN 9 10 20   CREATININE 0.54 0.55 0.82  CALCIUM  8.9 9.0 9.0  MG  --  1.9  --      Recent Results (from the past 240 hours)  Resp panel by RT-PCR (RSV, Flu A&B, Covid) Anterior Nasal Swab     Status: None   Collection Time: 03/02/24  8:43 PM   Specimen: Anterior Nasal Swab  Result Value Ref Range Status   SARS Coronavirus 2 by RT PCR NEGATIVE NEGATIVE Final    Comment: (NOTE) SARS-CoV-2 target nucleic acids are NOT DETECTED.  The SARS-CoV-2 RNA is generally detectable in upper respiratory specimens during the acute phase of infection. The lowest concentration of SARS-CoV-2 viral copies this assay can detect is 138 copies/mL. A negative result does not preclude SARS-Cov-2 infection and should not be used as the sole basis for treatment or other patient management decisions. A negative result may occur with  improper specimen collection/handling, submission of specimen other than nasopharyngeal swab, presence of viral mutation(s) within the areas targeted by this assay, and inadequate number of viral copies(<138 copies/mL). A negative result must be combined with clinical observations, patient history, and epidemiological information. The expected result is Negative.  Fact Sheet for Patients:  BloggerCourse.com  Fact Sheet for Healthcare Providers:  SeriousBroker.it  This test is no t yet approved or cleared by the [place]  FDA and  has been authorized for detection and/or diagnosis of SARS-CoV-2 by FDA under an Emergency Use Authorization (EUA). This EUA will remain  in effect (meaning this test  can be used) for the duration of the COVID-19 declaration under Section 564(b)(1) of the Act, 21 U.S.C.section 360bbb-3(b)(1), unless the authorization is terminated  or revoked sooner.       Influenza A by PCR NEGATIVE NEGATIVE Final   Influenza B by PCR NEGATIVE NEGATIVE Final    Comment: (NOTE) The Xpert Xpress SARS-CoV-2/FLU/RSV plus assay is intended as an aid in the diagnosis of influenza from Nasopharyngeal swab specimens and should not be used as a sole basis for treatment. Nasal washings and aspirates are unacceptable for Xpert Xpress SARS-CoV-2/FLU/RSV testing.  Fact Sheet for Patients: BloggerCourse.com  Fact Sheet for Healthcare Providers: SeriousBroker.it  This test is not yet approved or cleared by the [place]  FDA and has been authorized for detection and/or diagnosis of SARS-CoV-2 by FDA under an Emergency Use Authorization (EUA). This EUA will remain in effect (meaning this test can be used) for the duration of the COVID-19 declaration under Section 564(b)(1) of the Act, 21 U.S.C. section 360bbb-3(b)(1), unless the authorization is terminated or revoked.     Resp  Syncytial Virus by PCR NEGATIVE NEGATIVE Final    Comment: (NOTE) Fact Sheet for Patients: BloggerCourse.com  Fact Sheet for Healthcare Providers: SeriousBroker.it  This test is not yet approved or cleared by the [place]  FDA and has been authorized for detection and/or diagnosis of SARS-CoV-2 by FDA under an Emergency Use Authorization (EUA). This EUA will remain in effect (meaning this test can be used) for the duration of the COVID-19 declaration under Section 564(b)(1) of the Act, 21 U.S.C. section 360bbb-3(b)(1), unless the authorization is terminated or revoked.  Performed at [facility], [address], [place], [place] [address]   Expectorated Sputum  Assessment w Gram Stain, Rflx to Resp Cult     Status: None   Collection Time: 03/03/24  2:42 AM   Specimen: Sputum  Result Value Ref Range Status   Specimen Description SPUTUM  Final   Special Requests NONE  Final   Sputum evaluation   Final    THIS SPECIMEN IS ACCEPTABLE FOR SPUTUM CULTURE Performed at [facility], [address] [place], [place] [address]    Report Status 03/03/2024 FINAL  Final  Culture, Respiratory w Gram Stain     Status: None (Preliminary result)   Collection Time: 03/03/24  2:42 AM   Specimen: SPU  Result Value Ref Range Status   Specimen Description   Final    SPUTUM Performed at [facility], [address] [place], [place] [address]    Special Requests   Final    NONE Reflexed from [id number] Performed at [facility], [address] [place], [place] [address]    Gram Stain   Final    FEW WBC PRESENT,BOTH PMN AND MONONUCLEAR ABUNDANT SQUAMOUS EPITHELIAL CELLS PRESENT MODERATE GRAM POSITIVE RODS MODERATE GRAM POSITIVE COCCI IN CLUSTERS FEW GRAM NEGATIVE RODS Performed at [facility], [address], [place], [place] [address]    Culture PENDING  Incomplete   Report Status PENDING  Incomplete     Radiology Studies: DG Chest 2 View Result Date: 03/02/2024 CLINICAL DATA:  Increased shortness of breath EXAM: CHEST - 2 VIEW COMPARISON:  02/16/2024 FINDINGS: Cardiac shadow is stable. Postsurgical changes are noted in the left lung with apical scarring. No acute infiltrate or effusion is seen. No bony abnormality is noted IMPRESSION: Chronic changes in the left apex.  No acute abnormality noted. Electronically Signed   By: Oneil Devonshire M.D.   On: 03/02/2024 21:10    Scheduled Meds:  apixaban   5 mg Oral BID   arformoterol   15 mcg Nebulization BID   chlorpheniramine-HYDROcodone   5 mL Oral Q12H   feeding supplement  237 mL Oral BID BM   guaiFENesin   600 mg Oral BID   levothyroxine   125 mcg  Oral Q0600   methylPREDNISolone  (SOLU-MEDROL ) injection  40 mg Intravenous Q12H   metoprolol  succinate  12.5 mg Oral Daily   revefenacin   175 mcg Nebulization Daily   rosuvastatin   20 mg Oral Daily   sertraline   25 mg Oral Daily   Continuous Infusions:  sodium chloride  10 mL/hr at 03/03/24 1250   ceFEPime  (MAXIPIME ) IV 2 g (03/04/24 0036)     LOS: 2 days   Ivonne Mustache, MD [facility] P6/26/2025, 11:01 AM

## 2024-03-05 DIAGNOSIS — J441 Chronic obstructive pulmonary disease with (acute) exacerbation: Secondary | ICD-10-CM | POA: Diagnosis not present

## 2024-03-05 LAB — CBC
HCT: 37.2 % (ref 36.0–46.0)
Hemoglobin: 11.3 g/dL — ABNORMAL LOW (ref 12.0–15.0)
MCH: 29.6 pg (ref 26.0–34.0)
MCHC: 30.4 g/dL (ref 30.0–36.0)
MCV: 97.4 fL (ref 80.0–100.0)
Platelets: 238 10*3/uL (ref 150–400)
RBC: 3.82 MIL/uL — ABNORMAL LOW (ref 3.87–5.11)
RDW: 14.1 % (ref 11.5–15.5)
WBC: 10.2 10*3/uL (ref 4.0–10.5)
nRBC: 0 % (ref 0.0–0.2)

## 2024-03-05 LAB — BASIC METABOLIC PANEL WITH GFR
Anion gap: 8 (ref 5–15)
BUN: 33 mg/dL — ABNORMAL HIGH (ref 8–23)
CO2: 34 mmol/L — ABNORMAL HIGH (ref 22–32)
Calcium: 8.9 mg/dL (ref 8.9–10.3)
Chloride: 98 mmol/L (ref 98–111)
Creatinine, Ser: 0.81 mg/dL (ref 0.44–1.00)
GFR, Estimated: >60 mL/min (ref >60)
Glucose, Bld: 173 mg/dL — ABNORMAL HIGH (ref 70–99)
Potassium: 4.6 mmol/L (ref 3.5–5.1)
Sodium: 140 mmol/L (ref 135–145)

## 2024-03-05 NOTE — Progress Notes (Signed)
 Tracy Park [address] [facility] Liaison note   Konni Kesinger is a current hospice patient with a terminal diagnosis of chronic obstructive pulmonary disease with acute exacerbation. Daughter contacted [facility] to advise patient was having pain in her chest, coughing and similar symptoms to previous pneumonia episode. Nurse visit was offered and declined. Daughter activated EMS for evaluation in the ED. Patient was admitted to [facility] 6.25.25 for COPD exacerbation. Per Dr. Norleen Laurence with [facility], this is a related hospital admission. Patient is a DNR.   Met with patient at bedside. She was resting comfortably with no complaints. She advised that she is feeling better and had just taken a walk which she tolerated well. She is hoping to get home in the next day or two.    Pt is inpatient appropriate due to treatment of COPD exacerbation and IV medication.   VS: 97.8/68/18    121/77    98% on O2 2 lpm [place]   I/O: 1570/200   Abnormal Labs:  03/05/24 03:18 CO2: 34 (H) Glucose: 173 (H) BUN: 33 (H) RBC: 3.82 (L) Hemoglobin: 11.3 (L)   Diagnostics: None new   IV/PRN meds:  Cefepime  2g IV x2, oxycodone  5mg  PO x1, Solu-Medrol  40mg  IV x2   Assessment and Plan (per Jillian Buttery, MD 6.27.25): Principal Problem:   COPD with acute exacerbation Active Problems:   Chronic respiratory failure with hypoxia and hypercapnia (HCC)   Anxiety and depression   History of lung cancer   Essential hypertension   Paroxysmal atrial fibrillation (HCC)   Acute on chronic respiratory failure with hypoxia and hypercapnia (HCC)   Obstructive sleep apnea (adult) (pediatric)   Acute COPD exacerbation: Presented with cough, shortness of breath.  On 2L of oxygen  at home.  Required 2 L of oxygen  on presentation. Currently on IV Solu-Medrol .  Continues to have wheezing today and dry cough.  Started on Brovana , Yupelri .  Continue Pulmicort .  Continue cefepime  for now for recurrent COPD  exacerbation.  No evidence of pneumonia as per chest x-ray on presentation.Added Tussionex She follows with pulmonology at [facility]. She continues to wheeze today so planning to continue  Solu-Medrol    History of lung cancer: status post left upper lobectomy for the history of adenocarcinoma.  Currently enrolled in hospice.   Paroxysmal A-fib: On Eliquis , metoprolol  at home.  Currently in normal sinus rhythm.     Depression: On Zoloft    Hypothyroidism: On Synthyroid   Tobacco abuse: Smokes 5 cigarettes a day.  Counseled for cessation   Discharge Planning: Ongoing, next 1-2 days   Family Contact: pt speaks for herself   IDT: updated   Goals of Care: DNR   Should patient need ambulance transport at discharge, please call [facility] as we contract with them for our active hospice patients.   Please call with any hospice questions or concerns.  Thank you, Eleanor Nail, [facility] Liaison [phone]

## 2024-03-05 NOTE — Plan of Care (Signed)

## 2024-03-05 NOTE — Progress Notes (Signed)
 PROGRESS NOTE  Tracy Park  FMW:[id number] DOB: [birth year] DOA: 03/02/2024 PCP: Corlis Longs, FNP   Brief Narrative: Patient is a 68 year old female with history of hypertension, depression, anxiety, paroxysmal A-fib on Eliquis , lung cancer status postlumpectomy, severe COPD with multiple admissions now enrolled with outpatient hospice, chronic hypoxic/hypercarbic respiratory failure who presented with worsening shortness of breath and cough, increased sputum production.  In the emergency department, she was treated with Atrovent /albuterol /Solu-Medrol .  Currently being managed for COPD exacerbation.  Assessment & Plan:  Principal Problem:   COPD with acute exacerbation Active Problems:   Chronic respiratory failure with hypoxia and hypercapnia (HCC)   Anxiety and depression   History of lung cancer   Essential hypertension   Paroxysmal atrial fibrillation (HCC)   Acute on chronic respiratory failure with hypoxia and hypercapnia (HCC)   Obstructive sleep apnea (adult) (pediatric)  Acute COPD exacerbation: Presented with cough, shortness of breath.  On 2L of oxygen  at home.  Required 2 L of oxygen  on presentation. Currently on IV Solu-Medrol .  Continues to have wheezing today and dry cough.  Started on Brovana , Yupelri .  Continue Pulmicort .  Continue cefepime  for now for recurrent COPD exacerbation.  No evidence of pneumonia as per chest x-ray on presentation.Added Tussionex She follows with pulmonology at [facility]. She continues to wheeze today so planning to continue  Solu-Medrol   History of lung cancer: status post left upper lobectomy for the history of adenocarcinoma.  Currently enrolled in hospice.  Paroxysmal A-fib: On Eliquis , metoprolol  at home.  Currently in normal sinus rhythm.    Depression: On Zoloft   Hypothyroidism: On Synthyroid  Tobacco abuse: Smokes 5 cigarettes a day.  Counseled for cessation  Deconditioning: PT consulted         DVT  prophylaxis: apixaban  (ELIQUIS ) tablet 5 mg     Code Status: Limited: Do not attempt resuscitation (DNR) -DNR-LIMITED -Do Not Intubate/DNI   Family Communication: None at the bedside  Patient status: Inpatient  Patient is from : Home  Anticipated discharge to: Home  Estimated DC date: 1 to 2 days   Consultants: None  Procedures: None  Antimicrobials:  Anti-infectives (From admission, onward)    Start     Dose/Rate Route Frequency Ordered Stop   03/03/24 1200  ceFEPIme  (MAXIPIME ) 2 g in sodium chloride  0.9 % 100 mL IVPB        2 g 200 mL/hr over 30 Minutes Intravenous Every 12 hours 03/03/24 0128     03/02/24 2345  ceFEPIme  (MAXIPIME ) 2 g in sodium chloride  0.9 % 100 mL IVPB        2 g 200 mL/hr over 30 Minutes Intravenous  Once 03/02/24 2342 03/03/24 0149       Subjective: Patient seen and examined at bedside today.  Hemodynamically stable.  On 2 L of oxygen  per minute which is her baseline.  She is still wheezing today.  She says her lungs are hurting.  She might have felt little better than yesterday.  Still has some cough.  We discussed about continuing same management for today  Objective: Vitals:   03/04/24 1843 03/04/24 2120 03/05/24 0524 03/05/24 0859  BP:  104/74 121/77   Pulse:  81 68   Resp:  17 18   Temp:  98 F (36.7 C) 97.8 F (36.6 C)   TempSrc:  Oral Oral   SpO2: 97% 98% 100% 98%  Weight:      Height:        Intake/Output Summary (Last 24 hours) at 03/05/2024  1114 Last data filed at 03/05/2024 0600 Gross per 24 hour  Intake 1330 ml  Output 200 ml  Net 1130 ml   Filed Weights   03/02/24 2042  Weight: 49.9 kg    Examination:   General exam: Overall comfortable, not in distress, very deconditioned, debilitated, thin built HEENT: PERRL Respiratory system: Bilateral expiratory wheezing Cardiovascular system: S1 & S2 heard, RRR.  Gastrointestinal system: Abdomen is nondistended, soft and nontender. Central nervous system: Alert and  oriented Extremities: No edema, no clubbing ,no cyanosis Skin: No rashes, no ulcers,no icterus     Data Reviewed: I have personally reviewed following labs and imaging studies  CBC: Recent Labs  Lab 03/02/24 2045 03/03/24 0334 03/04/24 0323 03/05/24 0318  WBC 9.4 6.3 9.7 10.2  HGB 12.6 13.1 12.1 11.3*  HCT 41.7 43.5 39.2 37.2  MCV 96.1 95.8 96.1 97.4  PLT 241 244 257 238   Basic Metabolic Panel: Recent Labs  Lab 03/02/24 2045 03/03/24 0334 03/04/24 0323 03/05/24 0318  NA 138 136 139 140  K 3.5 4.3 4.2 4.6  CL 91* 90* 98 98  CO2 37* 34* 31 34*  GLUCOSE 165* 218* 212* 173*  BUN 9 10 20  33*  CREATININE 0.54 0.55 0.82 0.81  CALCIUM  8.9 9.0 9.0 8.9  MG  --  1.9  --   --      Recent Results (from the past 240 hours)  Resp panel by RT-PCR (RSV, Flu A&B, Covid) Anterior Nasal Swab     Status: None   Collection Time: 03/02/24  8:43 PM   Specimen: Anterior Nasal Swab  Result Value Ref Range Status   SARS Coronavirus 2 by RT PCR NEGATIVE NEGATIVE Final    Comment: (NOTE) SARS-CoV-2 target nucleic acids are NOT DETECTED.  The SARS-CoV-2 RNA is generally detectable in upper respiratory specimens during the acute phase of infection. The lowest concentration of SARS-CoV-2 viral copies this assay can detect is 138 copies/mL. A negative result does not preclude SARS-Cov-2 infection and should not be used as the sole basis for treatment or other patient management decisions. A negative result may occur with  improper specimen collection/handling, submission of specimen other than nasopharyngeal swab, presence of viral mutation(s) within the areas targeted by this assay, and inadequate number of viral copies(<138 copies/mL). A negative result must be combined with clinical observations, patient history, and epidemiological information. The expected result is Negative.  Fact Sheet for Patients:  BloggerCourse.com  Fact Sheet for Healthcare  Providers:  SeriousBroker.it  This test is no t yet approved or cleared by the [place]  FDA and  has been authorized for detection and/or diagnosis of SARS-CoV-2 by FDA under an Emergency Use Authorization (EUA). This EUA will remain  in effect (meaning this test can be used) for the duration of the COVID-19 declaration under Section 564(b)(1) of the Act, 21 U.S.C.section 360bbb-3(b)(1), unless the authorization is terminated  or revoked sooner.       Influenza A by PCR NEGATIVE NEGATIVE Final   Influenza B by PCR NEGATIVE NEGATIVE Final    Comment: (NOTE) The Xpert Xpress SARS-CoV-2/FLU/RSV plus assay is intended as an aid in the diagnosis of influenza from Nasopharyngeal swab specimens and should not be used as a sole basis for treatment. Nasal washings and aspirates are unacceptable for Xpert Xpress SARS-CoV-2/FLU/RSV testing.  Fact Sheet for Patients: BloggerCourse.com  Fact Sheet for Healthcare Providers: SeriousBroker.it  This test is not yet approved or cleared by the [place]  FDA and has been  authorized for detection and/or diagnosis of SARS-CoV-2 by FDA under an Emergency Use Authorization (EUA). This EUA will remain in effect (meaning this test can be used) for the duration of the COVID-19 declaration under Section 564(b)(1) of the Act, 21 U.S.C. section 360bbb-3(b)(1), unless the authorization is terminated or revoked.     Resp Syncytial Virus by PCR NEGATIVE NEGATIVE Final    Comment: (NOTE) Fact Sheet for Patients: BloggerCourse.com  Fact Sheet for Healthcare Providers: SeriousBroker.it  This test is not yet approved or cleared by the [place]  FDA and has been authorized for detection and/or diagnosis of SARS-CoV-2 by FDA under an Emergency Use Authorization (EUA). This EUA will remain in effect (meaning this test can be  used) for the duration of the COVID-19 declaration under Section 564(b)(1) of the Act, 21 U.S.C. section 360bbb-3(b)(1), unless the authorization is terminated or revoked.  Performed at [facility], [address] [place], [place] [address]   Expectorated Sputum Assessment w Gram Stain, Rflx to Resp Cult     Status: None   Collection Time: 03/03/24  2:42 AM   Specimen: Sputum  Result Value Ref Range Status   Specimen Description SPUTUM  Final   Special Requests NONE  Final   Sputum evaluation   Final    THIS SPECIMEN IS ACCEPTABLE FOR SPUTUM CULTURE Performed at [facility], [address] [place], [place] [address]    Report Status 03/03/2024 FINAL  Final  Culture, Respiratory w Gram Stain     Status: None (Preliminary result)   Collection Time: 03/03/24  2:42 AM   Specimen: SPU  Result Value Ref Range Status   Specimen Description   Final    SPUTUM Performed at [facility], [address] [place], [place] [address]    Special Requests   Final    NONE Reflexed from [phone] Performed at [facility], [address], [place], [place] [address]    Gram Stain   Final    FEW WBC PRESENT,BOTH PMN AND MONONUCLEAR ABUNDANT SQUAMOUS EPITHELIAL CELLS PRESENT MODERATE GRAM POSITIVE RODS MODERATE GRAM POSITIVE COCCI IN CLUSTERS FEW GRAM NEGATIVE RODS    Culture   Final    CULTURE REINCUBATED FOR BETTER GROWTH Performed at [facility], [address], [place], [place] [address]    Report Status PENDING  Incomplete     Radiology Studies: No results found.   Scheduled Meds:  apixaban   5 mg Oral BID   arformoterol   15 mcg Nebulization BID   budesonide  (PULMICORT ) nebulizer solution  0.5 mg Nebulization BID   chlorpheniramine-HYDROcodone   5 mL Oral Q12H   feeding supplement  237 mL Oral BID BM   guaiFENesin   600 mg Oral BID   levothyroxine   125 mcg Oral Q0600   methylPREDNISolone   (SOLU-MEDROL ) injection  40 mg Intravenous Q12H   metoprolol  succinate  12.5 mg Oral Daily   revefenacin   175 mcg Nebulization Daily   rosuvastatin   20 mg Oral Daily   sertraline   25 mg Oral Daily   Continuous Infusions:  ceFEPime  (MAXIPIME ) IV 2 g (03/05/24 0024)     LOS: 3 days   Ivonne Mustache, MD [facility] P6/27/2025, 11:14 AM

## 2024-03-05 NOTE — Evaluation (Signed)
 Physical Therapy One Time Evaluation Patient Details Name: Tracy Park MRN: [id number] DOB: 30-Apr-1956 Today's Date: 03/05/2024  History of Present Illness  68 year old female admitted on 03/02/24 for Acute COPD exacerbation.  Past medical history of hypertension, depression, anxiety, paroxysmal A-fib on Eliquis , lung cancer status post lumpectomy, severe COPD with multiple admissions now enrolled with outpatient hospice, chronic hypoxic/hypercarbic respiratory failure  Clinical Impression  Patient evaluated by Physical Therapy with no further acute PT needs identified. All education has been completed and the patient has no further questions.  Pt ambulated good distance in hallway and remained on baseline 2L O2 [place], SPO2 91% upon returning to room.  Pt would like to mobilize more prior to d/c home.  Recommend staff and mobility specialists mobilize pt for remainder of admission.  No follow-up Physical Therapy or equipment needs identified at this time. PT is signing off. Thank you for this referral.           If plan is discharge home, recommend the following:     Can travel by private vehicle        Equipment Recommendations None recommended by PT  Recommendations for Other Services       Functional Status Assessment Patient has not had a recent decline in their functional status     Precautions / Restrictions Precautions Precautions: Fall Precaution/Restrictions Comments: 2L O2 [place] baseline      Mobility  Bed Mobility Overal bed mobility: Modified Independent                  Transfers Overall transfer level: Modified independent                      Ambulation/Gait Ambulation/Gait assistance: Supervision, Modified independent (Device/Increase time) Gait Distance (Feet): 200 Feet Assistive device: None Gait Pattern/deviations: Step-through pattern, Decreased stride length       General Gait Details: slow pace but steady without assistive device;  dyspnea 1/4, remained on baseline 2L O2 [place] and SPO2 91% upon return to room and quickly improved to 95%  Stairs            Wheelchair Mobility     Tilt Bed    Modified Rankin (Stroke Patients Only)       Balance Overall balance assessment: No apparent balance deficits (not formally assessed) (denies falls)                                           Pertinent Vitals/Pain Pain Assessment Pain Assessment: 0-10 Pain Score: 7  Pain Location: lung Pain Descriptors / Indicators: Discomfort Pain Intervention(s): Repositioned, Monitored during session, Patient requesting pain meds-RN notified    Home Living Family/patient expects to be discharged to:: Private residence Living Arrangements: Other (Comment) (family friend) Available Help at Discharge: Family;Available PRN/intermittently;Friend(s) Type of Home: Mobile home Home Access: Stairs to enter Entrance Stairs-Rails: Right;Left;Can reach both Entrance Stairs-Number of Steps: 3-4   Home Layout: One level Home Equipment: Cane - single point;Rollator (4 wheels);Shower seat;Grab bars - tub/shower;Hand held shower head Additional Comments: On 2 L O2 at home 24/7    Prior Function Prior Level of Function : Independent/Modified Independent             Mobility Comments: In home ambulated without AD; in community uses rollator ADLs Comments: Ind with ADLs/selfcare, IADLs, home mgt, cooking, no longer drives; can go out  in community but typically family does shopping     Extremity/Trunk Assessment   Upper Extremity Assessment Upper Extremity Assessment: Overall WFL for tasks assessed    Lower Extremity Assessment Lower Extremity Assessment: Overall WFL for tasks assessed       Communication   Communication Communication: No apparent difficulties    Cognition Arousal: Alert Behavior During Therapy: Flat affect   PT - Cognitive impairments: No apparent impairments                          Following commands: Intact       Cueing       General Comments      Exercises     Assessment/Plan    PT Assessment Patient does not need any further PT services  PT Problem List         PT Treatment Interventions      PT Goals (Current goals can be found in the Care Plan section)  Acute Rehab PT Goals PT Goal Formulation: All assessment and education complete, DC therapy    Frequency       Co-evaluation               AM-PAC PT 6 Clicks Mobility  Outcome Measure Help needed turning from your back to your side while in a flat bed without using bedrails?: A Little Help needed moving from lying on your back to sitting on the side of a flat bed without using bedrails?: A Little Help needed moving to and from a bed to a chair (including a wheelchair)?: A Little Help needed standing up from a chair using your arms (e.g., wheelchair or bedside chair)?: A Little Help needed to walk in hospital room?: A Little Help needed climbing 3-5 steps with a railing? : A Little 6 Click Score: 18    End of Session Equipment Utilized During Treatment: Gait belt;Oxygen  Activity Tolerance: Patient tolerated treatment well Patient left: with call bell/phone within reach;in bed Nurse Communication: Mobility status;Patient requests pain meds PT Visit Diagnosis: Difficulty in walking, not elsewhere classified (R26.2)    Time: 1450-1503 PT Time Calculation (min) (ACUTE ONLY): 13 min   Charges:   PT Evaluation $PT Eval Low Complexity: 1 Low   PT General Charges $$ ACUTE PT VISIT: 1 Visit        Tari PT, DPT Physical Therapist Acute Rehabilitation Services Office: [phone]   Tari CROME [place] 03/05/2024, 3:54 PM

## 2024-03-06 ENCOUNTER — Inpatient Hospital Stay (HOSPITAL_COMMUNITY)

## 2024-03-06 DIAGNOSIS — J441 Chronic obstructive pulmonary disease with (acute) exacerbation: Secondary | ICD-10-CM | POA: Diagnosis not present

## 2024-03-06 LAB — CULTURE, RESPIRATORY W GRAM STAIN

## 2024-03-06 MED ORDER — IPRATROPIUM-ALBUTEROL 0.5-2.5 (3) MG/3ML IN SOLN
RESPIRATORY_TRACT | Status: AC
  Filled 2024-03-06: qty 3

## 2024-03-06 MED ORDER — IPRATROPIUM-ALBUTEROL 0.5-2.5 (3) MG/3ML IN SOLN
3.0000 mL | Freq: Four times a day (QID) | RESPIRATORY_TRACT | Status: DC | PRN
  Administered 2024-03-06: 3 mL via RESPIRATORY_TRACT

## 2024-03-06 NOTE — Plan of Care (Signed)
   Problem: Education: Goal: Knowledge of General Education information will improve Description: Including pain rating scale, medication(s)/side effects and non-pharmacologic comfort measures Outcome: Progressing   Problem: Nutrition: Goal: Adequate nutrition will be maintained Outcome: Progressing

## 2024-03-06 NOTE — Progress Notes (Signed)
 [facility] hospitalized hospice patient visit  Tracy Park is a current hospice patient with a terminal diagnosis of chronic obstructive pulmonary disease with acute exacerbation. Daughter contacted [facility] to advise patient was having pain in her chest, coughing and similar symptoms to previous pneumonia episode. Nurse visit was offered and declined. Daughter activated EMS for evaluation in the ED. Patient was admitted to [facility] 6.25.25 for COPD exacerbation. Per Dr. Norleen Laurence with [facility], this is a related hospital admission. Patient is a DNR.   Tracy Park was able to walk just a little bit out in the hall before she began complaining of "lung pain" and was taken back to her room with a wheelchair, she continues with some wheezing. She remains inpatient appropriate with need for IV steroids and skilled level monitoring.  Vital Signs: 98.3/76/18    120/67    spO2 96% on 2L nasal cannula I&O: 420/not recorded Abnormal labs: none new Diagnostics:  CXR with no acute findings, continued Emphysema IV/PRN Meds: Methylprednisolone  40mg  BID, Cefepime  2g IV q12H, Oxycodone  5mg  PO PRN x3 Problem List as per PN Dr. Ivonne Mustache 6.28.25 Acute COPD exacerbation: Presented with cough, shortness of breath.  On 2L of oxygen  at home.  Required 2 L of oxygen  on presentation. Currently on IV Solu-Medrol .  Continues to have wheezing today and dry cough.  Started on Brovana , Yupelri .  Continue Pulmicort .  Continue cefepime  for now for recurrent COPD exacerbation.  No evidence of pneumonia as per chest x-ray on presentation.Added Tussionex She follows with pulmonology at [facility]. She continues to wheeze today so planning to continue  Solu-Medrol    History of lung cancer: status post left upper lobectomy for the history of adenocarcinoma.  Currently enrolled in hospice.   Paroxysmal A-fib: On Eliquis , metoprolol  at home.  Currently in normal sinus rhythm.      Depression: On Zoloft    Hypothyroidism: On Synthyroid   Tobacco abuse: Smokes 5 cigarettes a day.  Counseled for cessation  Discharge Planning: Ongoing, likely next couple of days Family Contact: None patient is her own spokesperson IDT: Updated Goals of Care: DNR [name], BSN RN Hospice hospital liaison [phone]

## 2024-03-06 NOTE — Progress Notes (Signed)
 PROGRESS NOTE  DARRIANA DEBOY  FMW:[id number] DOB: 12-10-1955 DOA: 03/02/2024 PCP: Corlis Longs, FNP   Brief Narrative: Patient is a 68 year old female with history of hypertension, depression, anxiety, paroxysmal A-fib on Eliquis , lung cancer status postlumpectomy, severe COPD with multiple admissions now enrolled with outpatient hospice, chronic hypoxic/hypercarbic respiratory failure who presented with worsening shortness of breath and cough, increased sputum production.  In the emergency department, she was treated with Atrovent /albuterol /Solu-Medrol .  Currently being managed for COPD exacerbation.  Prolonged hospital course due to slow improvement  Assessment & Plan:  Principal Problem:   COPD with acute exacerbation Active Problems:   Chronic respiratory failure with hypoxia and hypercapnia (HCC)   Anxiety and depression   History of lung cancer   Essential hypertension   Paroxysmal atrial fibrillation (HCC)   Acute on chronic respiratory failure with hypoxia and hypercapnia (HCC)   Obstructive sleep apnea (adult) (pediatric)  Acute COPD exacerbation: Presented with cough, shortness of breath.  On 2L of oxygen  at home.  Required 2 L of oxygen  on presentation. Currently on IV Solu-Medrol .  Continues to have wheezing today and dry cough.  Started on Brovana , Yupelri .  Continue Pulmicort .  Continue cefepime  for now for recurrent COPD exacerbation.  No evidence of pneumonia as per chest x-ray on presentation.Added Tussionex She follows with pulmonology at [facility]. She continues to wheeze today so planning to continue  Solu-Medrol   History of lung cancer: status post left upper lobectomy for the history of adenocarcinoma.  Currently enrolled in hospice.  Paroxysmal A-fib: On Eliquis , metoprolol  at home.  Currently in normal sinus rhythm.    Depression: On Zoloft   Hypothyroidism: On Synthyroid  Tobacco abuse: Smokes 5 cigarettes a day.  Counseled for cessation  Deconditioning:  PT consulted, no follow-up recommended         DVT prophylaxis: apixaban  (ELIQUIS ) tablet 5 mg     Code Status: Limited: Do not attempt resuscitation (DNR) -DNR-LIMITED -Do Not Intubate/DNI   Family Communication: None at the bedside  Patient status: Inpatient  Patient is from : Home  Anticipated discharge to: Home  Estimated DC date: 1 to 2 days   Consultants: None  Procedures: None  Antimicrobials:  Anti-infectives (From admission, onward)    Start     Dose/Rate Route Frequency Ordered Stop   03/03/24 1200  ceFEPIme  (MAXIPIME ) 2 g in sodium chloride  0.9 % 100 mL IVPB        2 g 200 mL/hr over 30 Minutes Intravenous Every 12 hours 03/03/24 0128     03/02/24 2345  ceFEPIme  (MAXIPIME ) 2 g in sodium chloride  0.9 % 100 mL IVPB        2 g 200 mL/hr over 30 Minutes Intravenous  Once 03/02/24 2342 03/03/24 0149       Subjective: Patient seen and examined at bedside today.  Hemodynamically stable.  She was able to ambulate few steps today but had to stop because she had pain on the left lung.  Still wheezing and weak.  Short of breath.  Objective: Vitals:   03/05/24 2247 03/06/24 0607 03/06/24 0924 03/06/24 0926  BP: 101/73 122/80    Pulse: 70 72    Resp: 15 16    Temp: (!) 97.5 F (36.4 C) 98.9 F (37.2 C)    TempSrc: Oral     SpO2: 98% 98% 91% 96%  Weight:      Height:        Intake/Output Summary (Last 24 hours) at 03/06/2024 1107 Last data filed at 03/06/2024  0600 Gross per 24 hour  Intake 920 ml  Output --  Net 920 ml   Filed Weights   03/02/24 2042  Weight: 49.9 kg    Examination:   General exam: Overall comfortable, not in distress, very deconditioned, debilitated, thin built HEENT: PERRL Respiratory system: Bilateral expiratory wheezing Cardiovascular system: S1 & S2 heard, RRR.  Gastrointestinal system: Abdomen is nondistended, soft and nontender. Central nervous system: Alert and oriented Extremities: No edema, no clubbing ,no  cyanosis Skin: No rashes, no ulcers,no icterus     Data Reviewed: I have personally reviewed following labs and imaging studies  CBC: Recent Labs  Lab 03/02/24 2045 03/03/24 0334 03/04/24 0323 03/05/24 0318  WBC 9.4 6.3 9.7 10.2  HGB 12.6 13.1 12.1 11.3*  HCT 41.7 43.5 39.2 37.2  MCV 96.1 95.8 96.1 97.4  PLT 241 244 257 238   Basic Metabolic Panel: Recent Labs  Lab 03/02/24 2045 03/03/24 0334 03/04/24 0323 03/05/24 0318  NA 138 136 139 140  K 3.5 4.3 4.2 4.6  CL 91* 90* 98 98  CO2 37* 34* 31 34*  GLUCOSE 165* 218* 212* 173*  BUN 9 10 20  33*  CREATININE 0.54 0.55 0.82 0.81  CALCIUM  8.9 9.0 9.0 8.9  MG  --  1.9  --   --      Recent Results (from the past 240 hours)  Resp panel by RT-PCR (RSV, Flu A&B, Covid) Anterior Nasal Swab     Status: None   Collection Time: 03/02/24  8:43 PM   Specimen: Anterior Nasal Swab  Result Value Ref Range Status   SARS Coronavirus 2 by RT PCR NEGATIVE NEGATIVE Final    Comment: (NOTE) SARS-CoV-2 target nucleic acids are NOT DETECTED.  The SARS-CoV-2 RNA is generally detectable in upper respiratory specimens during the acute phase of infection. The lowest concentration of SARS-CoV-2 viral copies this assay can detect is 138 copies/mL. A negative result does not preclude SARS-Cov-2 infection and should not be used as the sole basis for treatment or other patient management decisions. A negative result may occur with  improper specimen collection/handling, submission of specimen other than nasopharyngeal swab, presence of viral mutation(s) within the areas targeted by this assay, and inadequate number of viral copies(<138 copies/mL). A negative result must be combined with clinical observations, patient history, and epidemiological information. The expected result is Negative.  Fact Sheet for Patients:  BloggerCourse.com  Fact Sheet for Healthcare Providers:   SeriousBroker.it  This test is no t yet approved or cleared by the [place]  FDA and  has been authorized for detection and/or diagnosis of SARS-CoV-2 by FDA under an Emergency Use Authorization (EUA). This EUA will remain  in effect (meaning this test can be used) for the duration of the COVID-19 declaration under Section 564(b)(1) of the Act, 21 U.S.C.section 360bbb-3(b)(1), unless the authorization is terminated  or revoked sooner.       Influenza A by PCR NEGATIVE NEGATIVE Final   Influenza B by PCR NEGATIVE NEGATIVE Final    Comment: (NOTE) The Xpert Xpress SARS-CoV-2/FLU/RSV plus assay is intended as an aid in the diagnosis of influenza from Nasopharyngeal swab specimens and should not be used as a sole basis for treatment. Nasal washings and aspirates are unacceptable for Xpert Xpress SARS-CoV-2/FLU/RSV testing.  Fact Sheet for Patients: BloggerCourse.com  Fact Sheet for Healthcare Providers: SeriousBroker.it  This test is not yet approved or cleared by the [place]  FDA and has been authorized for detection and/or diagnosis of SARS-CoV-2  by FDA under an Emergency Use Authorization (EUA). This EUA will remain in effect (meaning this test can be used) for the duration of the COVID-19 declaration under Section 564(b)(1) of the Act, 21 U.S.C. section 360bbb-3(b)(1), unless the authorization is terminated or revoked.     Resp Syncytial Virus by PCR NEGATIVE NEGATIVE Final    Comment: (NOTE) Fact Sheet for Patients: BloggerCourse.com  Fact Sheet for Healthcare Providers: SeriousBroker.it  This test is not yet approved or cleared by the [place]  FDA and has been authorized for detection and/or diagnosis of SARS-CoV-2 by FDA under an Emergency Use Authorization (EUA). This EUA will remain in effect (meaning this test can be used) for  the duration of the COVID-19 declaration under Section 564(b)(1) of the Act, 21 U.S.C. section 360bbb-3(b)(1), unless the authorization is terminated or revoked.  Performed at [facility], [address], [place], [place] [address]   Expectorated Sputum Assessment w Gram Stain, Rflx to Resp Cult     Status: None   Collection Time: 03/03/24  2:42 AM   Specimen: Sputum  Result Value Ref Range Status   Specimen Description SPUTUM  Final   Special Requests NONE  Final   Sputum evaluation   Final    THIS SPECIMEN IS ACCEPTABLE FOR SPUTUM CULTURE Performed at [facility], [address], [place], [place] [address]    Report Status 03/03/2024 FINAL  Final  Culture, Respiratory w Gram Stain     Status: None (Preliminary result)   Collection Time: 03/03/24  2:42 AM   Specimen: SPU  Result Value Ref Range Status   Specimen Description   Final    SPUTUM Performed at [facility], [address], [place], [place] [address]    Special Requests   Final    NONE Reflexed from [id number] Performed at [facility], [address], [place], [place] [address]    Gram Stain   Final    FEW WBC PRESENT,BOTH PMN AND MONONUCLEAR ABUNDANT SQUAMOUS EPITHELIAL CELLS PRESENT MODERATE GRAM POSITIVE RODS MODERATE GRAM POSITIVE COCCI IN CLUSTERS FEW GRAM NEGATIVE RODS    Culture   Final    FEW PSEUDOMONAS AERUGINOSA SUSCEPTIBILITIES TO FOLLOW Performed at [facility], [address], [place], [place] [address]    Report Status PENDING  Incomplete     Radiology Studies: No results found.   Scheduled Meds:  apixaban   5 mg Oral BID   arformoterol   15 mcg Nebulization BID   budesonide  (PULMICORT ) nebulizer solution  0.5 mg Nebulization BID   chlorpheniramine-HYDROcodone   5 mL Oral Q12H   feeding supplement  237 mL Oral BID BM   guaiFENesin   600 mg Oral BID   levothyroxine   125 mcg Oral Q0600   methylPREDNISolone   (SOLU-MEDROL ) injection  40 mg Intravenous Q12H   metoprolol  succinate  12.5 mg Oral Daily   revefenacin   175 mcg Nebulization Daily   rosuvastatin   20 mg Oral Daily   sertraline   25 mg Oral Daily   Continuous Infusions:  ceFEPime  (MAXIPIME ) IV 2 g (03/06/24 0017)     LOS: 4 days   Ivonne Mustache, MD [facility] P6/28/2025, 11:07 AM

## 2024-03-06 NOTE — Progress Notes (Signed)
 Mobility Specialist - Progress Note   03/06/24 0924  Oxygen  Therapy  SpO2 91 %  O2 Device Nasal Cannula  O2 Flow Rate (L/min) 2 L/min  Patient Activity (if Appropriate) Ambulating  Mobility  Activity Ambulated independently in hallway  Level of Assistance Modified independent, requires aide device or extra time  Assistive Device None  Distance Ambulated (ft) 100 ft  Activity Response Tolerated fair  Mobility Referral Yes  Mobility visit 1 Mobility  Mobility Specialist Start Time (ACUTE ONLY) [id number]  Mobility Specialist Stop Time (ACUTE ONLY) [id number]  Mobility Specialist Time Calculation (min) (ACUTE ONLY) 18 min   Pt received in bed and agreeable to mobility. Once in hallway, pt c/o lung pain halting in further ambulation. Pt wheeled back into room with help of nursing. RN increased to 4-5L allowing SpO2 to come back up to 98-100%. No other complaints during session. RN present during session. Pt to bed after session with all needs met.   Pre-mobility: 91%  SpO2 (2L [place]) Post-mobility: 100%  SPO2 (4-5L [place])  Maya Mykhia Danish Mobility Specialist

## 2024-03-07 DIAGNOSIS — J441 Chronic obstructive pulmonary disease with (acute) exacerbation: Secondary | ICD-10-CM | POA: Diagnosis not present

## 2024-03-07 LAB — CBC
HCT: 37.5 % (ref 36.0–46.0)
Hemoglobin: 11.6 g/dL — ABNORMAL LOW (ref 12.0–15.0)
MCH: 29.5 pg (ref 26.0–34.0)
MCHC: 30.9 g/dL (ref 30.0–36.0)
MCV: 95.4 fL (ref 80.0–100.0)
Platelets: 219 10*3/uL (ref 150–400)
RBC: 3.93 MIL/uL (ref 3.87–5.11)
RDW: 13.9 % (ref 11.5–15.5)
WBC: 9.8 10*3/uL (ref 4.0–10.5)
nRBC: 0 % (ref 0.0–0.2)

## 2024-03-07 LAB — BASIC METABOLIC PANEL WITH GFR
Anion gap: 8 (ref 5–15)
BUN: 33 mg/dL — ABNORMAL HIGH (ref 8–23)
CO2: 30 mmol/L (ref 22–32)
Calcium: 8.6 mg/dL — ABNORMAL LOW (ref 8.9–10.3)
Chloride: 98 mmol/L (ref 98–111)
Creatinine, Ser: 0.66 mg/dL (ref 0.44–1.00)
GFR, Estimated: >60 mL/min (ref >60)
Glucose, Bld: 185 mg/dL — ABNORMAL HIGH (ref 70–99)
Potassium: 4.7 mmol/L (ref 3.5–5.1)
Sodium: 136 mmol/L (ref 135–145)

## 2024-03-07 MED ORDER — ORAL CARE MOUTH RINSE: 15.0000 mL | OROMUCOSAL | Status: DC | PRN

## 2024-03-07 NOTE — Plan of Care (Signed)
   Problem: Coping: Goal: Level of anxiety will decrease Outcome: Progressing   Problem: Pain Managment: Goal: General experience of comfort will improve and/or be controlled Outcome: Progressing   Problem: Safety: Goal: Ability to remain free from injury will improve Outcome: Progressing

## 2024-03-07 NOTE — Progress Notes (Signed)
 PROGRESS NOTE  Tracy Park  FMW:[id number] DOB: May 28, 1956 DOA: 03/02/2024 PCP: Corlis Longs, FNP   Brief Narrative: Patient is a 68 year old female with history of hypertension, depression, anxiety, paroxysmal A-fib on Eliquis , lung cancer status postlumpectomy, severe COPD with multiple admissions now enrolled with outpatient hospice, chronic hypoxic/hypercarbic respiratory failure who presented with worsening shortness of breath and cough, increased sputum production.  In the emergency department, she was treated with Atrovent /albuterol /Solu-Medrol .  Currently being managed for COPD exacerbation.  Prolonged hospital course due to slow improvement.  Possible discharge planning tomorrow.  Assessment & Plan:  Principal Problem:   COPD with acute exacerbation Active Problems:   Chronic respiratory failure with hypoxia and hypercapnia (HCC)   Anxiety and depression   History of lung cancer   Essential hypertension   Paroxysmal atrial fibrillation (HCC)   Acute on chronic respiratory failure with hypoxia and hypercapnia (HCC)   Obstructive sleep apnea (adult) (pediatric)  Acute COPD exacerbation: Presented with cough, shortness of breath.  On 2L of oxygen  at home.  Required 2 L of oxygen  on presentation. Currently on IV Solu-Medrol .  Started on Brovana , Yupelri .  Continue Pulmicort .  Continue cefepime  for now for recurrent COPD exacerbation, 5 days course.  No evidence of pneumonia as per chest x-ray on presentation.Added Tussionex She follows with pulmonology at [facility]. She still smokes 5 to 6 cigarettes a day.  Counseled for cessation.  Will change steroids to prednisone  on tapering dose.  History of lung cancer: status post left upper lobectomy for the history of adenocarcinoma.  Currently enrolled in hospice.  Paroxysmal A-fib: On Eliquis , metoprolol  at home.  Currently in normal sinus rhythm.    Depression: On Zoloft   Hypothyroidism: On Synthyroid  Tobacco abuse: Smokes 5-6  cigarettes a day.  Counseled for cessation  Deconditioning: PT consulted, no follow-up recommended         DVT prophylaxis: apixaban  (ELIQUIS ) tablet 5 mg     Code Status: Limited: Do not attempt resuscitation (DNR) -DNR-LIMITED -Do Not Intubate/DNI   Family Communication: None at the bedside  Patient status: Inpatient  Patient is from : Home  Anticipated discharge to: Home  Estimated DC date: tomorrow   Consultants: None  Procedures: None  Antimicrobials:  Anti-infectives (From admission, onward)    Start     Dose/Rate Route Frequency Ordered Stop   03/03/24 1200  ceFEPIme  (MAXIPIME ) 2 g in sodium chloride  0.9 % 100 mL IVPB        2 g 200 mL/hr over 30 Minutes Intravenous Every 12 hours 03/03/24 0128 03/08/24 1159   03/02/24 2345  ceFEPIme  (MAXIPIME ) 2 g in sodium chloride  0.9 % 100 mL IVPB        2 g 200 mL/hr over 30 Minutes Intravenous  Once 03/02/24 2342 03/03/24 0149       Subjective: Patient seen and examined at bedside today.  She looks comfortable than yesterday.  Still has some wheezing but not severe than yesterday.  During my evaluation, she was not coughing and was able to speak full sentences.  We discussed about possible plan for discharge home tomorrow.  We talked about the importance of smoking cessation and follow-up with her pulmonologist as an outpatient  Objective: Vitals:   03/06/24 2234 03/07/24 0550 03/07/24 0745 03/07/24 0929  BP: 110/76 126/72    Pulse: 68 64    Resp: 15 16    Temp: 98.6 F (37 C) 98.5 F (36.9 C)    TempSrc: Oral Oral    SpO2: 100%  100% 98% 94%  Weight:      Height:        Intake/Output Summary (Last 24 hours) at 03/07/2024 1057 Last data filed at 03/07/2024 0900 Gross per 24 hour  Intake 440 ml  Output --  Net 440 ml   Filed Weights   03/02/24 2042  Weight: 49.9 kg    Examination:   General exam: Overall comfortable, not in distress, very deconditioned, debilitated HEENT: PERRL Respiratory system:  Mild bilateral expiratory wheezing Cardiovascular system: S1 & S2 heard, RRR.  Gastrointestinal system: Abdomen is nondistended, soft and nontender. Central nervous system: Alert and oriented Extremities: No edema, no clubbing ,no cyanosis Skin: No rashes, no ulcers,no icterus     Data Reviewed: I have personally reviewed following labs and imaging studies  CBC: Recent Labs  Lab 03/02/24 2045 03/03/24 0334 03/04/24 0323 03/05/24 0318 03/07/24 0329  WBC 9.4 6.3 9.7 10.2 9.8  HGB 12.6 13.1 12.1 11.3* 11.6*  HCT 41.7 43.5 39.2 37.2 37.5  MCV 96.1 95.8 96.1 97.4 95.4  PLT 241 244 257 238 219   Basic Metabolic Panel: Recent Labs  Lab 03/02/24 2045 03/03/24 0334 03/04/24 0323 03/05/24 0318 03/07/24 0329  NA 138 136 139 140 136  K 3.5 4.3 4.2 4.6 4.7  CL 91* 90* 98 98 98  CO2 37* 34* 31 34* 30  GLUCOSE 165* 218* 212* 173* 185*  BUN 9 10 20  33* 33*  CREATININE 0.54 0.55 0.82 0.81 0.66  CALCIUM  8.9 9.0 9.0 8.9 8.6*  MG  --  1.9  --   --   --      Recent Results (from the past 240 hours)  Resp panel by RT-PCR (RSV, Flu A&B, Covid) Anterior Nasal Swab     Status: None   Collection Time: 03/02/24  8:43 PM   Specimen: Anterior Nasal Swab  Result Value Ref Range Status   SARS Coronavirus 2 by RT PCR NEGATIVE NEGATIVE Final    Comment: (NOTE) SARS-CoV-2 target nucleic acids are NOT DETECTED.  The SARS-CoV-2 RNA is generally detectable in upper respiratory specimens during the acute phase of infection. The lowest concentration of SARS-CoV-2 viral copies this assay can detect is 138 copies/mL. A negative result does not preclude SARS-Cov-2 infection and should not be used as the sole basis for treatment or other patient management decisions. A negative result may occur with  improper specimen collection/handling, submission of specimen other than nasopharyngeal swab, presence of viral mutation(s) within the areas targeted by this assay, and inadequate number of  viral copies(<138 copies/mL). A negative result must be combined with clinical observations, patient history, and epidemiological information. The expected result is Negative.  Fact Sheet for Patients:  BloggerCourse.com  Fact Sheet for Healthcare Providers:  SeriousBroker.it  This test is no t yet approved or cleared by the [place]  FDA and  has been authorized for detection and/or diagnosis of SARS-CoV-2 by FDA under an Emergency Use Authorization (EUA). This EUA will remain  in effect (meaning this test can be used) for the duration of the COVID-19 declaration under Section 564(b)(1) of the Act, 21 U.S.C.section 360bbb-3(b)(1), unless the authorization is terminated  or revoked sooner.       Influenza A by PCR NEGATIVE NEGATIVE Final   Influenza B by PCR NEGATIVE NEGATIVE Final    Comment: (NOTE) The Xpert Xpress SARS-CoV-2/FLU/RSV plus assay is intended as an aid in the diagnosis of influenza from Nasopharyngeal swab specimens and should not be used as a sole basis  for treatment. Nasal washings and aspirates are unacceptable for Xpert Xpress SARS-CoV-2/FLU/RSV testing.  Fact Sheet for Patients: BloggerCourse.com  Fact Sheet for Healthcare Providers: SeriousBroker.it  This test is not yet approved or cleared by the [place]  FDA and has been authorized for detection and/or diagnosis of SARS-CoV-2 by FDA under an Emergency Use Authorization (EUA). This EUA will remain in effect (meaning this test can be used) for the duration of the COVID-19 declaration under Section 564(b)(1) of the Act, 21 U.S.C. section 360bbb-3(b)(1), unless the authorization is terminated or revoked.     Resp Syncytial Virus by PCR NEGATIVE NEGATIVE Final    Comment: (NOTE) Fact Sheet for Patients: BloggerCourse.com  Fact Sheet for Healthcare  Providers: SeriousBroker.it  This test is not yet approved or cleared by the [place]  FDA and has been authorized for detection and/or diagnosis of SARS-CoV-2 by FDA under an Emergency Use Authorization (EUA). This EUA will remain in effect (meaning this test can be used) for the duration of the COVID-19 declaration under Section 564(b)(1) of the Act, 21 U.S.C. section 360bbb-3(b)(1), unless the authorization is terminated or revoked.  Performed at [facility], [address] [place], [place] [address]   Expectorated Sputum Assessment w Gram Stain, Rflx to Resp Cult     Status: None   Collection Time: 03/03/24  2:42 AM   Specimen: Sputum  Result Value Ref Range Status   Specimen Description SPUTUM  Final   Special Requests NONE  Final   Sputum evaluation   Final    THIS SPECIMEN IS ACCEPTABLE FOR SPUTUM CULTURE Performed at [facility], [address], [place], [place] [address]    Report Status 03/03/2024 FINAL  Final  Culture, Respiratory w Gram Stain     Status: None   Collection Time: 03/03/24  2:42 AM   Specimen: SPU  Result Value Ref Range Status   Specimen Description   Final    SPUTUM Performed at [facility], [address], [place], [place] [address]    Special Requests   Final    NONE Reflexed from [id number] Performed at [facility], [address] [place], [place] [address]    Gram Stain   Final    FEW WBC PRESENT,BOTH PMN AND MONONUCLEAR ABUNDANT SQUAMOUS EPITHELIAL CELLS PRESENT MODERATE GRAM POSITIVE RODS MODERATE GRAM POSITIVE COCCI IN CLUSTERS FEW GRAM NEGATIVE RODS Performed at [facility], [address], [place], [place] [address]    Culture FEW PSEUDOMONAS AERUGINOSA  Final   Report Status 03/06/2024 FINAL  Final   Organism ID, Bacteria PSEUDOMONAS AERUGINOSA  Final      Susceptibility   Pseudomonas aeruginosa - MIC*    CEFTAZIDIME  <=1 SENSITIVE Sensitive     CIPROFLOXACIN <=0.25 SENSITIVE Sensitive     GENTAMICIN 8 INTERMEDIATE Intermediate     IMIPENEM 1 SENSITIVE Sensitive     PIP/TAZO <=4 SENSITIVE Sensitive ug/mL    CEFEPIME  4 SENSITIVE Sensitive     * FEW PSEUDOMONAS AERUGINOSA     Radiology Studies: DG CHEST PORT 1 VIEW Result Date: 03/06/2024 CLINICAL DATA:  [id number] SOB (shortness of breath) [id number] EXAM: PORTABLE CHEST - 1 VIEW COMPARISON:  March 02, 2024 FINDINGS: Chronic apical scarring in the left upper lung zone. Underlying emphysema. No new airspace consolidation, pleural effusion, or pneumothorax. No cardiomegaly. Surgical clips in the left hilum. Aortic atherosclerosis. No acute fracture or destructive lesions. Multilevel thoracic osteophytosis. IMPRESSION: Emphysema.  No acute cardiopulmonary abnormality.  Electronically Signed   By: Rogelia Myers M.D.   On: 03/06/2024 12:53     Scheduled Meds:  apixaban   5 mg Oral BID   arformoterol   15 mcg Nebulization BID   budesonide  (PULMICORT ) nebulizer solution  0.5 mg Nebulization BID   chlorpheniramine-HYDROcodone   5 mL Oral Q12H   feeding supplement  237 mL Oral BID BM   guaiFENesin   600 mg Oral BID   levothyroxine   125 mcg Oral Q0600   methylPREDNISolone  (SOLU-MEDROL ) injection  40 mg Intravenous Q12H   metoprolol  succinate  12.5 mg Oral Daily   revefenacin   175 mcg Nebulization Daily   rosuvastatin   20 mg Oral Daily   sertraline   25 mg Oral Daily   Continuous Infusions:  ceFEPime  (MAXIPIME ) IV 200 mL/hr at 03/07/24 0730     LOS: 5 days   Ivonne Mustache, MD [facility] P6/29/2025, 10:57 AM

## 2024-03-07 NOTE — Progress Notes (Signed)
 [facility] hospitalized hospice patient visit Tracy Park is a current hospice patient with a terminal diagnosis of chronic obstructive pulmonary disease with acute exacerbation. Daughter contacted [facility] to advise patient was having pain in her chest, coughing and similar symptoms to previous pneumonia episode. Nurse visit was offered and declined. Daughter activated EMS for evaluation in the ED. Patient was admitted to [facility] 6.25.25 for COPD exacerbation. Per Dr. Norleen Laurence with [facility], this is a related hospital admission. Patient is a DNR.   Patient was resting with eyes closed upon entering room. She was easily aroused and noted that she was feeling some better today and had been able to get up and walk some. She feels like she will be ready to go home tomorrow and has discussed this with MD this morning.   She remains inpatient appropriate with need for IV steroids and IVAB.  Vital Signs: 98.5/84/16    126/72    spO2 94% on 2L nasal cannula I&O: 200/not recorded Abnormal labs: BUN 33, Ca+ 8.6, Hgb 11.6, Glucose 185 Diagnostics:  None new IV/PRN Meds: Methylprednisolone  40mg  BID, Cefepime  2g IV q12H, Oxycodone  5mg  PO PRN x2 Problem List as per PN Dr. Ivonne Mustache 6.29.25 Acute COPD exacerbation: Presented with cough, shortness of breath.  On 2L of oxygen  at home.  Required 2 L of oxygen  on presentation. Currently on IV Solu-Medrol .  Started on Brovana , Yupelri .  Continue Pulmicort .  Continue cefepime  for now for recurrent COPD exacerbation, 5 days course.  No evidence of pneumonia as per chest x-ray on presentation.Added Tussionex She follows with pulmonology at [facility]. She still smokes 5 to 6 cigarettes a day.  Counseled for cessation.  Will change steroids to prednisone  on tapering dose.   History of lung cancer: status post left upper lobectomy for the history of adenocarcinoma.  Currently enrolled in hospice.   Paroxysmal A-fib: On  Eliquis , metoprolol  at home.  Currently in normal sinus rhythm.     Depression: On Zoloft    Hypothyroidism: On Synthyroid   Tobacco abuse: Smokes 5-6 cigarettes a day.  Counseled for cessation   Deconditioning: PT consulted, no follow-up recommended  Discharge Planning: Ongoing, likely tomorrow Family Contact: None patient is her own spokesperson IDT: Updated Goals of Care: DNR [name], BSN RN Hospice hospital liaison [phone]

## 2024-03-07 NOTE — Progress Notes (Signed)
 Mobility Specialist - Progress Note   03/07/24 0929  Oxygen  Therapy  SpO2 94 %  O2 Device Nasal Cannula  O2 Flow Rate (L/min) 2 L/min  Patient Activity (if Appropriate) Ambulating  Mobility  Activity Ambulated independently in hallway  Level of Assistance Independent  Assistive Device None  Distance Ambulated (ft) 250 ft  Activity Response Tolerated well  Mobility Referral Yes  Mobility visit 1 Mobility  Mobility Specialist Start Time (ACUTE ONLY) 0915  Mobility Specialist Stop Time (ACUTE ONLY) [id number]  Mobility Specialist Time Calculation (min) (ACUTE ONLY) 8 min   Pt received in bed and agreeable to mobility. C/o knees cramping during session. Pt to bathroom after session with all needs met.    Pre-mobility: 99% SpO2 During mobility: 94% SpO2 Post-mobility: 96% SPO2  Chief Technology Officer

## 2024-03-08 ENCOUNTER — Other Ambulatory Visit (HOSPITAL_COMMUNITY): Payer: Self-pay

## 2024-03-08 DIAGNOSIS — J441 Chronic obstructive pulmonary disease with (acute) exacerbation: Secondary | ICD-10-CM | POA: Diagnosis not present

## 2024-03-08 MED ORDER — ALBUTEROL SULFATE HFA 108 (90 BASE) MCG/ACT IN AERS
2.0000 | INHALATION_SPRAY | Freq: Four times a day (QID) | RESPIRATORY_TRACT | 0 refills | Status: AC | PRN
  Filled 2024-03-08: qty 18, 30d supply, fill #0

## 2024-03-08 MED ORDER — PREDNISONE 20 MG PO TABS
40.0000 mg | ORAL_TABLET | Freq: Every day | ORAL | Status: DC
  Administered 2024-03-08: 40 mg via ORAL
  Filled 2024-03-08: qty 2

## 2024-03-08 MED ORDER — PREDNISONE 10 MG PO TABS
10.0000 mg | ORAL_TABLET | Freq: Every day | ORAL | 0 refills | Status: DC
  Filled 2024-03-08: qty 50, 30d supply, fill #0

## 2024-03-08 MED ORDER — BREZTRI AEROSPHERE 160-9-4.8 MCG/ACT IN AERO
2.0000 | INHALATION_SPRAY | Freq: Three times a day (TID) | RESPIRATORY_TRACT | 5 refills | Status: AC
  Filled 2024-03-08: qty 10.7, 30d supply, fill #0

## 2024-03-08 MED ORDER — GUAIFENESIN ER 600 MG PO TB12
600.0000 mg | ORAL_TABLET | Freq: Two times a day (BID) | ORAL | 0 refills | Status: AC
  Filled 2024-03-08: qty 60, 30d supply, fill #0

## 2024-03-08 NOTE — Progress Notes (Signed)
 Pt states feeling better and ok to be transport home. EMS called and EMS referred to [facility]. [facility] called.will be here in 2hrs.

## 2024-03-08 NOTE — Progress Notes (Signed)
Discharge package printed and instructions given to pt. Pt verbalizes understanding. 

## 2024-03-08 NOTE — Progress Notes (Signed)
 I responded to a patient request for support.  I have met with Tracy Park on previous admissions and provided emotional and spiritual support.  Today, she requested prayer as she prepares for discharge.  Her Tracy Park is a strong support for her and she has limited other support.  She will return to her daughter's, but her daughter has many things going on herself and Tracy Park is uncertain how much her daughter will be able to do to help her if she needs.  She is in good spirits as she gets ready to go home and expressed appreciation for our time and prayer together.

## 2024-03-08 NOTE — Discharge Summary (Signed)
 Physician Discharge Summary  TRUDI MORGENTHALER FMW:[id number] DOB: Mar 06, 1956 DOA: 03/02/2024  PCP: Corlis Longs, FNP  Admit date: 03/02/2024 Discharge date: 03/08/2024  Admitted From: Home Disposition:  Home  Discharge Condition:Stable CODE STATUS:FULL Diet recommendation: Heart Healthy  Brief/Interim Summary: Patient is a 68 year old female with history of hypertension, depression, anxiety, paroxysmal A-fib on Eliquis , lung cancer status postlumpectomy, severe COPD with multiple admissions now enrolled with outpatient hospice, chronic hypoxic/hypercarbic respiratory failure who presented with worsening shortness of breath and cough, increased sputum production.  In the emergency department, she was treated with Atrovent /albuterol /Solu-Medrol .  Managed for COPD exacerbation.  Prolonged hospital course due to slow improvement.  Now clinically improved.  She feels much better today.  Medically stable for discharge home today with tapering dose of prednisone .  Following problems were addressed during the hospitalization:  Acute COPD exacerbation: Presented with cough, shortness of breath.  On 2L of oxygen  at home.  Required 2 L of oxygen  on presentation. Started on IV Solu-Medrol , Brovana , Yupelri . Was on cefepime  for recurrent COPD exacerbation, 5 days course.  No evidence of pneumonia as per chest x-ray on presentation. She follows with pulmonology at [facility]. She still smokes 5 to 6 cigarettes a day.  Counseled for cessation.  Will change steroids to prednisone  on tapering dose.   History of lung cancer: status post left upper lobectomy for the history of adenocarcinoma.  Currently enrolled in hospice.   Paroxysmal A-fib: On Eliquis , metoprolol  at home.  Currently in normal sinus rhythm.     Depression: On Zoloft    Hypothyroidism: On Synthyroid   Tobacco abuse: Smokes 5-6 cigarettes a day.  Counseled for cessation   Deconditioning: PT consulted, no follow-up recommended      Discharge Diagnoses:  Principal Problem:   COPD with acute exacerbation Active Problems:   Chronic respiratory failure with hypoxia and hypercapnia (HCC)   Anxiety and depression   History of lung cancer   Essential hypertension   Paroxysmal atrial fibrillation (HCC)   Acute on chronic respiratory failure with hypoxia and hypercapnia (HCC)   Obstructive sleep apnea (adult) (pediatric)    Discharge Instructions  Discharge Instructions     Diet - low sodium heart healthy   Complete by: As directed    Discharge instructions   Complete by: As directed    1)Please take prescribed medications as instructed. 2)Follow up with your PCP in a week.  Follow-up with your  pulmonologist. 3)Stop smoking   Increase activity slowly   Complete by: As directed       Allergies as of 03/08/2024       Reactions   Red Dye #40 (allura Red) Hives, Itching, Other (See Comments)   Red food dye   Strawberry Extract Hives, Itching   Tomato Hives, Itching   Aspirin Hives   Tape Rash, Other (See Comments)   Prefers paper tape   Wound Dressing Adhesive Rash        Medication List     STOP taking these medications    oxyCODONE -acetaminophen  5-325 MG tablet Commonly known as: PERCOCET/ROXICET   sodium chloride  HYPERTONIC 3 % nebulizer solution       TAKE these medications    albuterol  108 (90 Base) MCG/ACT inhaler Commonly known as: VENTOLIN  HFA Inhale 2 puffs into the lungs every 6 (six) hours as needed for wheezing or shortness of breath. What changed: Another medication with the same name was removed. Continue taking this medication, and follow the directions you see here.   artificial tears ophthalmic  solution Place 1 drop into the left eye in the morning, at noon, in the evening, and at bedtime.   Breztri  Aerosphere 160-9-4.8 MCG/ACT Aero inhaler Generic drug: budesonide -glycopyrrolate -formoterol  Inhale 2 puffs into the lungs in the morning, at noon, and at bedtime.    cyclobenzaprine  10 MG tablet Commonly known as: FLEXERIL  Take 1 tablet (10 mg total) by mouth 2 (two) times daily as needed for muscle spasms. What changed: when to take this   Eliquis  5 MG Tabs tablet Generic drug: apixaban  Take 1 tablet (5 mg total) by mouth 2 (two) times daily.   guaiFENesin  600 MG 12 hr tablet Commonly known as: Mucinex  Take 1 tablet (600 mg total) by mouth 2 (two) times daily.   ipratropium-albuterol  0.5-2.5 (3) MG/3ML Soln Commonly known as: DUONEB Take 3 mLs by nebulization 3 (three) times daily.   levothyroxine  125 MCG tablet Commonly known as: SYNTHROID  Take 125 mcg by mouth daily before breakfast.   lidocaine  5 % Commonly known as: Lidoderm  Place 1 patch onto the skin daily. Remove & Discard patch within 12 hours or as directed by MD   losartan  25 MG tablet Commonly known as: COZAAR  Take 12.5 mg by mouth daily.   magnesium  oxide 400 (240 Mg) MG tablet Commonly known as: MAG-OX Take 400 mg by mouth 2 (two) times daily.   metoprolol  succinate 25 MG 24 hr tablet Commonly known as: TOPROL -XL Take 12.5 mg by mouth daily.   morphine  CONCENTRATE 10 mg / 0.5 ml concentrated solution SMARTSIG:0.25 Milliliter(s) By Mouth Every 4 Hours PRN   nitroGLYCERIN  0.4 MG SL tablet Commonly known as: NITROSTAT  Place 0.4 mg under the tongue every 5 (five) minutes as needed for chest pain.   oxyCODONE  5 MG immediate release tablet Commonly known as: Roxicodone  Take 1 tablet (5 mg total) by mouth every 4 (four) hours as needed for severe pain (pain score 7-10).   OXYGEN  Inhale 2 L/min into the lungs continuous.   prednisoLONE  acetate 1 % ophthalmic suspension Commonly known as: PRED FORTE  Place 1 drop into the left eye 4 (four) times daily.   predniSONE  10 MG tablet Commonly known as: DELTASONE  Take 1 tablet (10 mg total) by mouth daily. Take 4 pills daily for 2 days then 3 pills daily for 3 days then 2 pills daily for 3 days then continue taking 1 pill  daily Start taking on: March 09, 2024 What changed:  when to take this additional instructions   rosuvastatin  20 MG tablet Commonly known as: CRESTOR  Take 20 mg by mouth daily.   sertraline  25 MG tablet Commonly known as: ZOLOFT  Take 25 mg by mouth in the morning.        Follow-up Information     Corlis Longs, FNP. Schedule an appointment as soon as possible for a visit in 1 week(s).   Specialty: Family Medicine Contact information: [address] [place] [address] [phone]                Allergies  Allergen Reactions   Red Dye #40 (Allura Red) Hives, Itching and Other (See Comments)    Red food dye   Strawberry Extract Hives and Itching   Tomato Hives and Itching   Aspirin Hives   Tape Rash and Other (See Comments)    Prefers paper tape   Wound Dressing Adhesive Rash    Consultations: None   Procedures/Studies: DG CHEST PORT 1 VIEW Result Date: 03/06/2024 CLINICAL DATA:  [id number] SOB (shortness of breath) [id number] EXAM:  PORTABLE CHEST - 1 VIEW COMPARISON:  March 02, 2024 FINDINGS: Chronic apical scarring in the left upper lung zone. Underlying emphysema. No new airspace consolidation, pleural effusion, or pneumothorax. No cardiomegaly. Surgical clips in the left hilum. Aortic atherosclerosis. No acute fracture or destructive lesions. Multilevel thoracic osteophytosis. IMPRESSION: Emphysema.  No acute cardiopulmonary abnormality. Electronically Signed   By: Rogelia Myers M.D.   On: 03/06/2024 12:53   DG Chest 2 View Result Date: 03/02/2024 CLINICAL DATA:  Increased shortness of breath EXAM: CHEST - 2 VIEW COMPARISON:  02/16/2024 FINDINGS: Cardiac shadow is stable. Postsurgical changes are noted in the left lung with apical scarring. No acute infiltrate or effusion is seen. No bony abnormality is noted IMPRESSION: Chronic changes in the left apex.  No acute abnormality noted. Electronically Signed   By: Oneil Devonshire M.D.   On: 03/02/2024 21:10    CT ABDOMEN PELVIS W CONTRAST Result Date: 02/16/2024 CLINICAL DATA:  Patient fell several days ago. Now with left flank and midline back and hip pain. Patient is on chronic anticoagulation. Evaluate for retroperitoneal bleed or fracture. EXAM: CT ABDOMEN AND PELVIS WITH CONTRAST TECHNIQUE: Multidetector CT imaging of the abdomen and pelvis was performed using the standard protocol following bolus administration of intravenous contrast. RADIATION DOSE REDUCTION: This exam was performed according to the departmental dose-optimization program which includes automated exposure control, adjustment of the mA and/or kV according to patient size and/or use of iterative reconstruction technique. CONTRAST:  75mL OMNIPAQUE  IOHEXOL  300 MG/ML  SOLN COMPARISON:  CT stone study 06/24/2023. FINDINGS: Lower chest: Centrilobular emphysema. Hepatobiliary: No suspicious focal abnormality within the liver parenchyma. There is no evidence for gallstones, gallbladder wall thickening, or pericholecystic fluid. No intrahepatic or extrahepatic biliary dilation. Pancreas: No focal mass lesion. No dilatation of the main duct. No intraparenchymal cyst. No peripancreatic edema. Spleen: No splenomegaly. No suspicious focal mass lesion. Adrenals/Urinary Tract: No adrenal nodule or mass. Early excretion of contrast material noted right kidney. Kidneys otherwise unremarkable. No evidence for hydroureter. The urinary bladder appears normal for the degree of distention. Stomach/Bowel: Stomach is unremarkable. No gastric wall thickening. No evidence of outlet obstruction. Duodenum is normally positioned as is the ligament of Treitz. No small bowel wall thickening. No small bowel dilatation. The terminal ileum is normal. The appendix is not well visualized, but there is no edema or inflammation in the region of the cecal tip to suggest appendicitis. No gross colonic mass. No colonic wall thickening. Vascular/Lymphatic: There is moderate  atherosclerotic calcification of the abdominal aorta without aneurysm. There is no gastrohepatic or hepatoduodenal ligament lymphadenopathy. No retroperitoneal or mesenteric lymphadenopathy. No pelvic sidewall lymphadenopathy. Reproductive: There is no adnexal mass. Other: No intraperitoneal free fluid. Musculoskeletal: No worrisome lytic or sclerotic osseous abnormality. Spondylolisthesis at L4-5 is similar to prior. Superior endplate compression deformity at L3 is new in the interval IMPRESSION: 1. No acute findings in the abdomen or pelvis. Specifically, no evidence for retroperitoneal hematoma. 2. Superior endplate compression deformity at L3 is new in the interval since prior CT. See dedicated lumbar spine CT report dictated separately. 3. Aortic Atherosclerosis (ICD10-I70.0) and Emphysema (ICD10-J43.9). Electronically Signed   By: Camellia Candle M.D.   On: 02/16/2024 08:45   CT L-SPINE NO CHARGE Result Date: 02/16/2024 CLINICAL DATA:  Status post fall EXAM: CT LUMBAR SPINE WITHOUT CONTRAST TECHNIQUE: Multidetector CT imaging of the lumbar spine was performed without intravenous contrast administration. Multiplanar CT image reconstructions were also generated. RADIATION DOSE REDUCTION: This exam was performed according to  the departmental dose-optimization program which includes automated exposure control, adjustment of the mA and/or kV according to patient size and/or use of iterative reconstruction technique. COMPARISON:  Plain films February 10, 2024 FINDINGS: Segmentation: Transitional vertebra to be considered S1 Alignment: Grade 1-2 posterior listhesis of S1 on L5 Vertebrae: Compression fracture deformity of the superior endplate of L4 with less than 25% loss of height no significant retropulsed fragment in the canal. Mild anterior wedge deformity of T12 can not rule out fracture. Correlation with MRI recommended Paraspinal and other soft tissues: Negative. Disc levels: Circumferential bulging disc L4-5.  Circumferential bulging disc L5-S1. No significant foraminal narrowing. IMPRESSION: *Compression fracture deformity of the superior endplate of L4 with less than 25% loss of height no significant retropulsed fragment in the canal. *Mild anterior wedge deformity of T12 can not rule out fracture. Correlation with MRI recommended. *Grade 1-2 posterior listhesis of S1 on L5. *Circumferential bulging disc L4-5 and L5-S1. Electronically Signed   By: Franky Chard M.D.   On: 02/16/2024 08:28   DG Chest Port 1 View Result Date: 02/16/2024 CLINICAL DATA:  Patient fell 3 days ago.  Increased dyspnea. EXAM: PORTABLE CHEST 1 VIEW COMPARISON:  02/05/2024 FINDINGS: Similar volume loss left hemithorax with left apical pleuroparenchymal scarring, stable. Surgical clip noted in the region of the left hilum. Stable blunting left costophrenic angle. Right lung clear. No acute bony abnormality. Telemetry leads overlie the chest. IMPRESSION: 1. No acute cardiopulmonary findings. 2. Stable volume loss left hemithorax with left apical pleuroparenchymal scarring. Electronically Signed   By: Camellia Candle M.D.   On: 02/16/2024 06:31   DG Hip Unilat W or Wo Pelvis 2-3 Views Left Result Date: 02/16/2024 CLINICAL DATA:  Patient fell 3 days ago.  Left hip pain. EXAM: DG HIP (WITH OR WITHOUT PELVIS) 2-3V LEFT COMPARISON:  None Available. FINDINGS: Bones are diffusely demineralized. SI joints and symphysis pubis unremarkable. No evidence for superior or inferior pubic ramus fracture. No findings to suggest femoral head dislocation. AP and frog-leg lateral views of the left hip show no evidence for femoral neck fracture. IMPRESSION: 1. No acute bony findings. Specifically, no definite evidence for left femoral neck fracture. 2. Diffuse bony demineralization. Electronically Signed   By: Camellia Candle M.D.   On: 02/16/2024 06:30   DG Lumbar Spine Complete Result Date: 02/10/2024 CLINICAL DATA:  Low back pain radiating into the left lung. EXAM:  LUMBAR SPINE - COMPLETE 4+ VIEW COMPARISON:  Lumbar spine x-ray 03/11/2022 FINDINGS: There is 8 mm of anterolisthesis at L5-S1, unchanged. There is no focal osseous lesion. There is no acute fracture. There is mild disc space narrowing at L5-S1 with degenerative facet change. IMPRESSION: Stable grade 1 anterolisthesis at L5-S1 with mild disc space narrowing and degenerative facet change. Electronically Signed   By: Greig Pique M.D.   On: 02/10/2024 17:40      Subjective: Patient seen and examined at bedside today.  Feels much better today.  On 2 L of oxygen  oxygen  per minute which is at baseline.  Feels ready to go home today.  Medically stable for discharge.  Discharge Exam: Vitals:   03/08/24 0658 03/08/24 0919  BP: 139/84   Pulse: 70   Resp:    Temp: 98.5 F (36.9 C)   SpO2: 99% 99%   Vitals:   03/07/24 1328 03/07/24 2105 03/08/24 0658 03/08/24 0919  BP: 127/77 125/79 139/84   Pulse: 72 74 70   Resp: 19 18    Temp: 98.7 F (37.1  C) 98.2 F (36.8 C) 98.5 F (36.9 C)   TempSrc:  Oral    SpO2: 96% 99% 99% 99%  Weight:      Height:        General: Pt is alert, awake, not in acute distress Cardiovascular: RRR, S1/S2 +, no rubs, no gallops Respiratory: Mild bilateral expiratory wheezing Abdominal: Soft, NT, ND, bowel sounds + Extremities: no edema, no cyanosis    The results of significant diagnostics from this hospitalization (including imaging, microbiology, ancillary and laboratory) are listed below for reference.     Microbiology: Recent Results (from the past 240 hours)  Resp panel by RT-PCR (RSV, Flu A&B, Covid) Anterior Nasal Swab     Status: None   Collection Time: 03/02/24  8:43 PM   Specimen: Anterior Nasal Swab  Result Value Ref Range Status   SARS Coronavirus 2 by RT PCR NEGATIVE NEGATIVE Final    Comment: (NOTE) SARS-CoV-2 target nucleic acids are NOT DETECTED.  The SARS-CoV-2 RNA is generally detectable in upper respiratory specimens during the  acute phase of infection. The lowest concentration of SARS-CoV-2 viral copies this assay can detect is 138 copies/mL. A negative result does not preclude SARS-Cov-2 infection and should not be used as the sole basis for treatment or other patient management decisions. A negative result may occur with  improper specimen collection/handling, submission of specimen other than nasopharyngeal swab, presence of viral mutation(s) within the areas targeted by this assay, and inadequate number of viral copies(<138 copies/mL). A negative result must be combined with clinical observations, patient history, and epidemiological information. The expected result is Negative.  Fact Sheet for Patients:  BloggerCourse.com  Fact Sheet for Healthcare Providers:  SeriousBroker.it  This test is no t yet approved or cleared by the [place]  FDA and  has been authorized for detection and/or diagnosis of SARS-CoV-2 by FDA under an Emergency Use Authorization (EUA). This EUA will remain  in effect (meaning this test can be used) for the duration of the COVID-19 declaration under Section 564(b)(1) of the Act, 21 U.S.C.section 360bbb-3(b)(1), unless the authorization is terminated  or revoked sooner.       Influenza A by PCR NEGATIVE NEGATIVE Final   Influenza B by PCR NEGATIVE NEGATIVE Final    Comment: (NOTE) The Xpert Xpress SARS-CoV-2/FLU/RSV plus assay is intended as an aid in the diagnosis of influenza from Nasopharyngeal swab specimens and should not be used as a sole basis for treatment. Nasal washings and aspirates are unacceptable for Xpert Xpress SARS-CoV-2/FLU/RSV testing.  Fact Sheet for Patients: BloggerCourse.com  Fact Sheet for Healthcare Providers: SeriousBroker.it  This test is not yet approved or cleared by the [place]  FDA and has been authorized for detection and/or  diagnosis of SARS-CoV-2 by FDA under an Emergency Use Authorization (EUA). This EUA will remain in effect (meaning this test can be used) for the duration of the COVID-19 declaration under Section 564(b)(1) of the Act, 21 U.S.C. section 360bbb-3(b)(1), unless the authorization is terminated or revoked.     Resp Syncytial Virus by PCR NEGATIVE NEGATIVE Final    Comment: (NOTE) Fact Sheet for Patients: BloggerCourse.com  Fact Sheet for Healthcare Providers: SeriousBroker.it  This test is not yet approved or cleared by the [place]  FDA and has been authorized for detection and/or diagnosis of SARS-CoV-2 by FDA under an Emergency Use Authorization (EUA). This EUA will remain in effect (meaning this test can be used) for the duration of the COVID-19 declaration under Section 564(b)(1) of the Act,  21 U.S.C. section 360bbb-3(b)(1), unless the authorization is terminated or revoked.  Performed at [facility], [address], [place], [place] [address]   Expectorated Sputum Assessment w Gram Stain, Rflx to Resp Cult     Status: None   Collection Time: 03/03/24  2:42 AM   Specimen: Sputum  Result Value Ref Range Status   Specimen Description SPUTUM  Final   Special Requests NONE  Final   Sputum evaluation   Final    THIS SPECIMEN IS ACCEPTABLE FOR SPUTUM CULTURE Performed at [facility], [address] [place], [place] [address]    Report Status 03/03/2024 FINAL  Final  Culture, Respiratory w Gram Stain     Status: None   Collection Time: 03/03/24  2:42 AM   Specimen: SPU  Result Value Ref Range Status   Specimen Description   Final    SPUTUM Performed at [facility], [address], [place], [place] [address]    Special Requests   Final    NONE Reflexed from [id number] Performed at [facility], [address], [place], [place] [address]    Gram  Stain   Final    FEW WBC PRESENT,BOTH PMN AND MONONUCLEAR ABUNDANT SQUAMOUS EPITHELIAL CELLS PRESENT MODERATE GRAM POSITIVE RODS MODERATE GRAM POSITIVE COCCI IN CLUSTERS FEW GRAM NEGATIVE RODS Performed at [facility], [address] [place], [place] [address]    Culture FEW PSEUDOMONAS AERUGINOSA  Final   Report Status 03/06/2024 FINAL  Final   Organism ID, Bacteria PSEUDOMONAS AERUGINOSA  Final      Susceptibility   Pseudomonas aeruginosa - MIC*    CEFTAZIDIME <=1 SENSITIVE Sensitive     CIPROFLOXACIN <=0.25 SENSITIVE Sensitive     GENTAMICIN 8 INTERMEDIATE Intermediate     IMIPENEM 1 SENSITIVE Sensitive     PIP/TAZO <=4 SENSITIVE Sensitive ug/mL    CEFEPIME  4 SENSITIVE Sensitive     * FEW PSEUDOMONAS AERUGINOSA     Labs: BNP (last 3 results) Recent Labs    01/02/24 1300 01/15/24 0448 02/05/24 1907  BNP 124.9* 95.3 94.4   Basic Metabolic Panel: Recent Labs  Lab 03/02/24 2045 03/03/24 0334 03/04/24 0323 03/05/24 0318 03/07/24 0329  NA 138 136 139 140 136  K 3.5 4.3 4.2 4.6 4.7  CL 91* 90* 98 98 98  CO2 37* 34* 31 34* 30  GLUCOSE 165* 218* 212* 173* 185*  BUN 9 10 20  33* 33*  CREATININE 0.54 0.55 0.82 0.81 0.66  CALCIUM  8.9 9.0 9.0 8.9 8.6*  MG  --  1.9  --   --   --    Liver Function Tests: No results for input(s): AST, ALT, ALKPHOS, BILITOT, PROT, ALBUMIN in the last 168 hours. No results for input(s): LIPASE, AMYLASE in the last 168 hours. No results for input(s): AMMONIA in the last 168 hours. CBC: Recent Labs  Lab 03/02/24 2045 03/03/24 0334 03/04/24 0323 03/05/24 0318 03/07/24 0329  WBC 9.4 6.3 9.7 10.2 9.8  HGB 12.6 13.1 12.1 11.3* 11.6*  HCT 41.7 43.5 39.2 37.2 37.5  MCV 96.1 95.8 96.1 97.4 95.4  PLT 241 244 257 238 219   Cardiac Enzymes: No results for input(s): CKTOTAL, CKMB, CKMBINDEX, TROPONINI in the last 168 hours. BNP: Invalid input(s): POCBNP CBG: No results for input(s): GLUCAP in the last  168 hours. D-Dimer No results for input(s): DDIMER in the last 72 hours. Hgb A1c No results for input(s): HGBA1C in the last 72  hours. Lipid Profile No results for input(s): CHOL, HDL, LDLCALC, TRIG, CHOLHDL, LDLDIRECT in the last 72 hours. Thyroid  function studies No results for input(s): TSH, T4TOTAL, T3FREE, THYROIDAB in the last 72 hours.  Invalid input(s): FREET3 Anemia work up No results for input(s): VITAMINB12, FOLATE, FERRITIN, TIBC, IRON , RETICCTPCT in the last 72 hours. Urinalysis    Component Value Date/Time   COLORURINE YELLOW 06/25/2023 0536   APPEARANCEUR CLEAR 06/25/2023 0536   LABSPEC 1.023 06/25/2023 0536   PHURINE 6.0 06/25/2023 0536   GLUCOSEU 50 (A) 06/25/2023 0536   HGBUR SMALL (A) 06/25/2023 0536   BILIRUBINUR NEGATIVE 06/25/2023 0536   KETONESUR NEGATIVE 06/25/2023 0536   PROTEINUR NEGATIVE 06/25/2023 0536   UROBILINOGEN 0.2 11/06/2009 1559   NITRITE NEGATIVE 06/25/2023 0536   LEUKOCYTESUR NEGATIVE 06/25/2023 0536   Sepsis Labs Recent Labs  Lab 03/03/24 0334 03/04/24 0323 03/05/24 0318 03/07/24 0329  WBC 6.3 9.7 10.2 9.8   Microbiology Recent Results (from the past 240 hours)  Resp panel by RT-PCR (RSV, Flu A&B, Covid) Anterior Nasal Swab     Status: None   Collection Time: 03/02/24  8:43 PM   Specimen: Anterior Nasal Swab  Result Value Ref Range Status   SARS Coronavirus 2 by RT PCR NEGATIVE NEGATIVE Final    Comment: (NOTE) SARS-CoV-2 target nucleic acids are NOT DETECTED.  The SARS-CoV-2 RNA is generally detectable in upper respiratory specimens during the acute phase of infection. The lowest concentration of SARS-CoV-2 viral copies this assay can detect is 138 copies/mL. A negative result does not preclude SARS-Cov-2 infection and should not be used as the sole basis for treatment or other patient management decisions. A negative result may occur with  improper specimen collection/handling,  submission of specimen other than nasopharyngeal swab, presence of viral mutation(s) within the areas targeted by this assay, and inadequate number of viral copies(<138 copies/mL). A negative result must be combined with clinical observations, patient history, and epidemiological information. The expected result is Negative.  Fact Sheet for Patients:  BloggerCourse.com  Fact Sheet for Healthcare Providers:  SeriousBroker.it  This test is no t yet approved or cleared by the [place]  FDA and  has been authorized for detection and/or diagnosis of SARS-CoV-2 by FDA under an Emergency Use Authorization (EUA). This EUA will remain  in effect (meaning this test can be used) for the duration of the COVID-19 declaration under Section 564(b)(1) of the Act, 21 U.S.C.section 360bbb-3(b)(1), unless the authorization is terminated  or revoked sooner.       Influenza A by PCR NEGATIVE NEGATIVE Final   Influenza B by PCR NEGATIVE NEGATIVE Final    Comment: (NOTE) The Xpert Xpress SARS-CoV-2/FLU/RSV plus assay is intended as an aid in the diagnosis of influenza from Nasopharyngeal swab specimens and should not be used as a sole basis for treatment. Nasal washings and aspirates are unacceptable for Xpert Xpress SARS-CoV-2/FLU/RSV testing.  Fact Sheet for Patients: BloggerCourse.com  Fact Sheet for Healthcare Providers: SeriousBroker.it  This test is not yet approved or cleared by the [place]  FDA and has been authorized for detection and/or diagnosis of SARS-CoV-2 by FDA under an Emergency Use Authorization (EUA). This EUA will remain in effect (meaning this test can be used) for the duration of the COVID-19 declaration under Section 564(b)(1) of the Act, 21 U.S.C. section 360bbb-3(b)(1), unless the authorization is terminated or revoked.     Resp Syncytial Virus by PCR NEGATIVE  NEGATIVE Final    Comment: (NOTE) Fact Sheet for Patients: BloggerCourse.com  Fact Sheet for Healthcare Providers: SeriousBroker.it  This test is not yet approved or cleared by the [place]  FDA and has been authorized for detection and/or diagnosis of SARS-CoV-2 by FDA under an Emergency Use Authorization (EUA). This EUA will remain in effect (meaning this test can be used) for the duration of the COVID-19 declaration under Section 564(b)(1) of the Act, 21 U.S.C. section 360bbb-3(b)(1), unless the authorization is terminated or revoked.  Performed at [facility], [address], [place], [place] [address]   Expectorated Sputum Assessment w Gram Stain, Rflx to Resp Cult     Status: None   Collection Time: 03/03/24  2:42 AM   Specimen: Sputum  Result Value Ref Range Status   Specimen Description SPUTUM  Final   Special Requests NONE  Final   Sputum evaluation   Final    THIS SPECIMEN IS ACCEPTABLE FOR SPUTUM CULTURE Performed at [facility], [address], [place], [place] [address]    Report Status 03/03/2024 FINAL  Final  Culture, Respiratory w Gram Stain     Status: None   Collection Time: 03/03/24  2:42 AM   Specimen: SPU  Result Value Ref Range Status   Specimen Description   Final    SPUTUM Performed at [facility], [address] [place], [place] [address]    Special Requests   Final    NONE Reflexed from [id number] Performed at [facility] [address] [place], [place] [address]    Gram Stain   Final    FEW WBC PRESENT,BOTH PMN AND MONONUCLEAR ABUNDANT SQUAMOUS EPITHELIAL CELLS PRESENT MODERATE GRAM POSITIVE RODS MODERATE GRAM POSITIVE COCCI IN CLUSTERS FEW GRAM NEGATIVE RODS Performed at [facility], [address] [place], [place] [address]    Culture FEW PSEUDOMONAS AERUGINOSA  Final   Report Status 03/06/2024 FINAL   Final   Organism ID, Bacteria PSEUDOMONAS AERUGINOSA  Final      Susceptibility   Pseudomonas aeruginosa - MIC*    CEFTAZIDIME <=1 SENSITIVE Sensitive     CIPROFLOXACIN <=0.25 SENSITIVE Sensitive     GENTAMICIN 8 INTERMEDIATE Intermediate     IMIPENEM 1 SENSITIVE Sensitive     PIP/TAZO <=4 SENSITIVE Sensitive ug/mL    CEFEPIME  4 SENSITIVE Sensitive     * FEW PSEUDOMONAS AERUGINOSA    Please note: You were cared for by a hospitalist during your hospital stay. Once you are discharged, your primary care physician will handle any further medical issues. Please note that NO REFILLS for any discharge medications will be authorized once you are discharged, as it is imperative that you return to your primary care physician (or establish a relationship with a primary care physician if you do not have one) for your post hospital discharge needs so that they can reassess your need for medications and monitor your lab values.    Time coordinating discharge: 40 minutes  SIGNED:   Ivonne Mustache, MD  [facility] 03/08/2024, 10:52 AM Pager [phone]  If 7PM-7AM, please contact night-coverage [email or web address] Password [email or web address]

## 2024-03-08 NOTE — TOC Progression Note (Signed)
 Transition of Care [facility]) - Progression Note    Patient Details  Name: Tracy Park MRN: [id number] Date of Birth: 1956-08-20  [facility] CM/SW Contact  NORMAN ASPEN, LCSW Phone Number: 03/08/2024, 4:43 PM  Clinical Narrative:     Alerted earlier today that pt's O2 tank in room is empty and that pt is being cleared for dc today.  Spoke with pt about need to have someone bring another tank from home when they pick her up.  Pt says that she has no one who can do that.  Unfortunately, agency who provides home O2 does not deliver to the hospital.  Discuss with [facility] liaison, who recommends transport home by [facility].  Pt now resting due to new back pain.  RN will call for EMS transport when pt is ready for dc this evening.  Call [phone] for EMS and will be transporting to home address.  No further TOC needs.        Expected Discharge Plan and Services         Expected Discharge Date: 03/08/24                                     Social Determinants of Health (SDOH) Interventions SDOH Screenings   Food Insecurity: Food Insecurity Present (03/03/2024)  Housing: Low Risk  (03/03/2024)  Transportation Needs: No Transportation Needs (03/03/2024)  Utilities: Not At Risk (03/03/2024)  Financial Resource Strain: High Risk (10/08/2023)   Received from [facility]  Physical Activity: Unknown (04/07/2023)   Received from [facility]  Social Connections: Socially Isolated (03/03/2024)  Stress: Stress Concern Present (04/07/2023)   Received from [facility]  Tobacco Use: High Risk (03/02/2024)    Readmission Risk Interventions    03/04/2024    2:47 PM 01/14/2024    2:11 PM 01/05/2024   12:08 PM  Readmission Risk Prevention Plan  Transportation Screening Complete Complete Complete  Medication Review Oceanographer) Complete Complete Complete  PCP or Specialist appointment within 3-5 days of discharge Complete Complete Complete  HRI or Home Care Consult  Complete -- Complete  SW Recovery Care/Counseling Consult Complete -- Complete  Palliative Care Screening Not Applicable Not Applicable Not Applicable  Skilled Nursing Facility Not Applicable Not Applicable Not Applicable

## 2024-03-08 NOTE — Progress Notes (Signed)
 Medications were delivered to patient from the [facility] pharmacy.

## 2024-03-08 NOTE — Plan of Care (Signed)
   Problem: Coping: Goal: Level of anxiety will decrease Outcome: Progressing   Problem: Pain Managment: Goal: General experience of comfort will improve and/or be controlled Outcome: Progressing   Problem: Safety: Goal: Ability to remain free from injury will improve Outcome: Progressing

## 2024-03-09 ENCOUNTER — Emergency Department (HOSPITAL_COMMUNITY)

## 2024-03-09 ENCOUNTER — Other Ambulatory Visit: Payer: Self-pay

## 2024-03-09 ENCOUNTER — Inpatient Hospital Stay (HOSPITAL_COMMUNITY)
Admission: EM | Admit: 2024-03-09 | Discharge: 2024-03-23 | DRG: 478 | Disposition: A | Discharge status: Skilled Nursing Facility | Attending: Internal Medicine | Admitting: Internal Medicine

## 2024-03-09 ENCOUNTER — Encounter (HOSPITAL_COMMUNITY): Payer: Self-pay | Admitting: Emergency Medicine

## 2024-03-09 DIAGNOSIS — F319 Bipolar disorder, unspecified: Secondary | ICD-10-CM | POA: Diagnosis present

## 2024-03-09 DIAGNOSIS — S32030A Wedge compression fracture of third lumbar vertebra, initial encounter for closed fracture: Secondary | ICD-10-CM | POA: Diagnosis present

## 2024-03-09 DIAGNOSIS — F17210 Nicotine dependence, cigarettes, uncomplicated: Secondary | ICD-10-CM | POA: Diagnosis present

## 2024-03-09 DIAGNOSIS — E890 Postprocedural hypothyroidism: Secondary | ICD-10-CM | POA: Diagnosis present

## 2024-03-09 DIAGNOSIS — Z79899 Other long term (current) drug therapy: Secondary | ICD-10-CM | POA: Diagnosis not present

## 2024-03-09 DIAGNOSIS — I480 Paroxysmal atrial fibrillation: Secondary | ICD-10-CM | POA: Diagnosis present

## 2024-03-09 DIAGNOSIS — Z66 Do not resuscitate: Secondary | ICD-10-CM | POA: Diagnosis present

## 2024-03-09 DIAGNOSIS — M545 Low back pain, unspecified: Principal | ICD-10-CM

## 2024-03-09 DIAGNOSIS — Z85118 Personal history of other malignant neoplasm of bronchus and lung: Secondary | ICD-10-CM

## 2024-03-09 DIAGNOSIS — Z515 Encounter for palliative care: Secondary | ICD-10-CM | POA: Diagnosis not present

## 2024-03-09 DIAGNOSIS — M4856XA Collapsed vertebra, not elsewhere classified, lumbar region, initial encounter for fracture: Secondary | ICD-10-CM | POA: Diagnosis present

## 2024-03-09 DIAGNOSIS — J441 Chronic obstructive pulmonary disease with (acute) exacerbation: Secondary | ICD-10-CM | POA: Diagnosis present

## 2024-03-09 DIAGNOSIS — Z79890 Hormone replacement therapy (postmenopausal): Secondary | ICD-10-CM | POA: Diagnosis not present

## 2024-03-09 DIAGNOSIS — S32050A Wedge compression fracture of fifth lumbar vertebra, initial encounter for closed fracture: Secondary | ICD-10-CM | POA: Diagnosis present

## 2024-03-09 DIAGNOSIS — Z902 Acquired absence of lung [part of]: Secondary | ICD-10-CM | POA: Diagnosis not present

## 2024-03-09 DIAGNOSIS — Z7901 Long term (current) use of anticoagulants: Secondary | ICD-10-CM | POA: Diagnosis not present

## 2024-03-09 DIAGNOSIS — Z9981 Dependence on supplemental oxygen: Secondary | ICD-10-CM

## 2024-03-09 DIAGNOSIS — J9611 Chronic respiratory failure with hypoxia: Secondary | ICD-10-CM | POA: Diagnosis present

## 2024-03-09 DIAGNOSIS — E785 Hyperlipidemia, unspecified: Secondary | ICD-10-CM | POA: Diagnosis present

## 2024-03-09 DIAGNOSIS — I10 Essential (primary) hypertension: Secondary | ICD-10-CM | POA: Diagnosis present

## 2024-03-09 DIAGNOSIS — S32000A Wedge compression fracture of unspecified lumbar vertebra, initial encounter for closed fracture: Secondary | ICD-10-CM | POA: Diagnosis present

## 2024-03-09 DIAGNOSIS — I252 Old myocardial infarction: Secondary | ICD-10-CM | POA: Diagnosis not present

## 2024-03-09 LAB — CBC WITH DIFFERENTIAL/PLATELET
Abs Immature Granulocytes: 0.22 10*3/uL — ABNORMAL HIGH (ref 0.00–0.07)
Basophils Absolute: 0 10*3/uL (ref 0.0–0.1)
Basophils Relative: 0 %
Eosinophils Absolute: 0 10*3/uL (ref 0.0–0.5)
Eosinophils Relative: 0 %
HCT: 41.7 % (ref 36.0–46.0)
Hemoglobin: 13.1 g/dL (ref 12.0–15.0)
Immature Granulocytes: 2 %
Lymphocytes Relative: 25 %
Lymphs Abs: 3 10*3/uL (ref 0.7–4.0)
MCH: 29 pg (ref 26.0–34.0)
MCHC: 31.4 g/dL (ref 30.0–36.0)
MCV: 92.5 fL (ref 80.0–100.0)
Monocytes Absolute: 1.1 10*3/uL — ABNORMAL HIGH (ref 0.1–1.0)
Monocytes Relative: 9 %
Neutro Abs: 7.9 10*3/uL — ABNORMAL HIGH (ref 1.7–7.7)
Neutrophils Relative %: 64 %
Platelets: 274 10*3/uL (ref 150–400)
RBC: 4.51 MIL/uL (ref 3.87–5.11)
RDW: 14.2 % (ref 11.5–15.5)
WBC: 12.3 10*3/uL — ABNORMAL HIGH (ref 4.0–10.5)
nRBC: 0 % (ref 0.0–0.2)

## 2024-03-09 LAB — COMPREHENSIVE METABOLIC PANEL WITH GFR
ALT: 46 U/L — ABNORMAL HIGH (ref 0–44)
AST: 33 U/L (ref 15–41)
Albumin: 3.4 g/dL — ABNORMAL LOW (ref 3.5–5.0)
Alkaline Phosphatase: 84 U/L (ref 38–126)
Anion gap: 8 (ref 5–15)
BUN: 16 mg/dL (ref 8–23)
CO2: 34 mmol/L — ABNORMAL HIGH (ref 22–32)
Calcium: 8.7 mg/dL — ABNORMAL LOW (ref 8.9–10.3)
Chloride: 96 mmol/L — ABNORMAL LOW (ref 98–111)
Creatinine, Ser: 0.44 mg/dL (ref 0.44–1.00)
GFR, Estimated: >60 mL/min (ref >60)
Glucose, Bld: 82 mg/dL (ref 70–99)
Potassium: 4 mmol/L (ref 3.5–5.1)
Sodium: 138 mmol/L (ref 135–145)
Total Bilirubin: 0.9 mg/dL (ref 0.0–1.2)
Total Protein: 6 g/dL — ABNORMAL LOW (ref 6.5–8.1)

## 2024-03-09 LAB — URINALYSIS, W/ REFLEX TO CULTURE (INFECTION SUSPECTED)
Bacteria, UA: NONE SEEN
Bilirubin Urine: NEGATIVE
Glucose, UA: NEGATIVE mg/dL
Hgb urine dipstick: NEGATIVE
Ketones, ur: NEGATIVE mg/dL
Leukocytes,Ua: NEGATIVE
Nitrite: NEGATIVE
Protein, ur: NEGATIVE mg/dL
Specific Gravity, Urine: 1.013 (ref 1.005–1.030)
pH: 7 (ref 5.0–8.0)

## 2024-03-09 LAB — LIPASE, BLOOD: Lipase: 23 U/L (ref 11–51)

## 2024-03-09 MED ORDER — MORPHINE SULFATE (PF) 4 MG/ML IV SOLN
4.0000 mg | Freq: Once | INTRAVENOUS | Status: AC
  Administered 2024-03-09: 4 mg via INTRAVENOUS
  Filled 2024-03-09: qty 1

## 2024-03-09 MED ORDER — MORPHINE SULFATE (PF) 4 MG/ML IV SOLN: 4.0000 mg | Freq: Once | INTRAVENOUS | Status: DC

## 2024-03-09 MED ORDER — LIDOCAINE 5 % EX PTCH
1.0000 | MEDICATED_PATCH | CUTANEOUS | Status: DC
  Administered 2024-03-09: 1 via TRANSDERMAL
  Filled 2024-03-09: qty 1

## 2024-03-09 NOTE — ED Notes (Signed)
 Please update [facility]) with any and all updates. Number can be found in the chart.

## 2024-03-09 NOTE — ED Provider Notes (Signed)
 [facility] AT [facility] Provider Note   CSN: [id number] Arrival date & time: 03/09/24  8086     Patient presents with: Back Pain and Shortness of Breath   Tracy Park is a 68 y.o. female.  {Add pertinent medical, surgical, social history, OB history to HPI:624} 68 year old female with prior medical history as detailed below presents for evaluation.  Patient reports worsening low back pain over the course of today.  Patient reports that her pain has been an intermittent issue for quite some time.  She reports that she had some pain yesterday when she was hospitalized here.  She reports that she reported her pain to staff.  She reports that she has been taking oxycodone  at home with minimal control of her pain.  She denies numbness or tingling in her lower extremities.  She reports increased pain with attempted ambulation.  She denies any significant acute shortness of breath.  EMS did administer 1 DuoNeb treatment during transport.  Patient with History of hypertension, depression, anxiety, paroxysmal A-fib on Eliquis , lung cancer status postlumpectomy, severe COPD with multiple admissions now enrolled with outpatient hospice, chronic hypoxic/hypercarbic respiratory failure.  The history is provided by the patient and medical records.       Prior to Admission medications   Medication Sig Start Date End Date Taking? Authorizing Provider  albuterol  (VENTOLIN  HFA) 108 (90 Base) MCG/ACT inhaler Inhale 2 puffs into the lungs every 6 (six) hours as needed for wheezing or shortness of breath. 03/08/24   Jillian Buttery, MD  apixaban  (ELIQUIS ) 5 MG TABS tablet Take 1 tablet (5 mg total) by mouth 2 (two) times daily. Patient not taking: Reported on 03/03/2024 06/06/23   Barbarann Nest, MD  budesonide -glycopyrrolate -formoterol  (BREZTRI  AEROSPHERE) 160-9-4.8 MCG/ACT AERO inhaler Inhale 2 puffs into the lungs in the morning, at noon, and at bedtime. 03/08/24    Jillian Buttery, MD  cyclobenzaprine  (FLEXERIL ) 10 MG tablet Take 1 tablet (10 mg total) by mouth 2 (two) times daily as needed for muscle spasms. Patient taking differently: Take 10 mg by mouth daily. 02/10/24   Dreama Longs, MD  guaiFENesin  (MUCINEX ) 600 MG 12 hr tablet Take 1 tablet (600 mg total) by mouth 2 (two) times daily. 03/08/24 03/08/25  Jillian Buttery, MD  ipratropium-albuterol  (DUONEB) 0.5-2.5 (3) MG/3ML SOLN Take 3 mLs by nebulization 3 (three) times daily. 02/29/24   [provider]  levothyroxine  (SYNTHROID ) 125 MCG tablet Take 125 mcg by mouth daily before breakfast.    [provider]  lidocaine  (LIDODERM ) 5 % Place 1 patch onto the skin daily. Remove & Discard patch within 12 hours or as directed by MD Patient not taking: Reported on 03/03/2024 02/10/24   Dreama Longs, MD  losartan  (COZAAR ) 25 MG tablet Take 12.5 mg by mouth daily. 02/09/24   [provider]  magnesium  oxide (MAG-OX) 400 (240 Mg) MG tablet Take 400 mg by mouth 2 (two) times daily.    [provider]  metoprolol  succinate (TOPROL -XL) 25 MG 24 hr tablet Take 12.5 mg by mouth daily.    [provider]  Morphine  Sulfate (MORPHINE  CONCENTRATE) 10 mg / 0.5 ml concentrated solution SMARTSIG:0.25 Milliliter(s) By Mouth Every 4 Hours PRN 02/19/24   [provider]  nitroGLYCERIN  (NITROSTAT ) 0.4 MG SL tablet Place 0.4 mg under the tongue every 5 (five) minutes as needed for chest pain.    [provider]  oxyCODONE  (ROXICODONE ) 5 MG immediate release tablet Take 1 tablet (5 mg total) by  mouth every 4 (four) hours as needed for severe pain (pain score 7-10). 02/10/24   Dreama Longs, MD  OXYGEN  Inhale 2 L/min into the lungs continuous.    [provider]  polyvinyl alcohol  (LIQUIFILM TEARS) 1.4 % ophthalmic solution Place 1 drop into the left eye in the morning, at noon, in the evening, and at bedtime.    [provider]  prednisoLONE  acetate  (PRED FORTE ) 1 % ophthalmic suspension Place 1 drop into the left eye 4 (four) times daily. 11/19/23   [provider]  predniSONE  (DELTASONE ) 10 MG tablet Take 4 pills daily for 2 days then 3 pills daily for 3 days then 2 pills daily for 3 days then continue taking 1 pill daily 03/09/24   Jillian Buttery, MD  rosuvastatin  (CRESTOR ) 20 MG tablet Take 20 mg by mouth daily.    [provider]  sertraline  (ZOLOFT ) 25 MG tablet Take 25 mg by mouth in the morning.    [provider]    Allergies: Red dye #40 (allura red), Strawberry extract, Tomato, Aspirin, Tape, and Wound dressing adhesive    Review of Systems  All other systems reviewed and are negative.   Updated Vital Signs BP 118/76   Pulse 72   Temp 98 F (36.7 C) (Oral)   Resp 18   SpO2 99%   Physical Exam Vitals and nursing note reviewed.  Constitutional:      General: She is not in acute distress.    Appearance: Normal appearance. She is well-developed.  HENT:     Head: Normocephalic and atraumatic.  Eyes:     Conjunctiva/sclera: Conjunctivae normal.     Pupils: Pupils are equal, round, and reactive to light.  Cardiovascular:     Rate and Rhythm: Normal rate and regular rhythm.     Heart sounds: Normal heart sounds.  Pulmonary:     Effort: Pulmonary effort is normal. No respiratory distress.     Breath sounds: Normal breath sounds.  Abdominal:     General: There is no distension.     Palpations: Abdomen is soft.     Tenderness: There is no abdominal tenderness.  Musculoskeletal:        General: No deformity. Normal range of motion.     Cervical back: Normal range of motion and neck supple.     Comments: Diffuse pain localized to the mid to low back.  Bilateral lower extremities are neurovascularly intact with 5 out of 5 distal strength.  Skin:    General: Skin is warm and dry.  Neurological:     General: No focal deficit present.     Mental Status: She is alert and oriented to person,  place, and time.     (all labs ordered are listed, but only abnormal results are displayed) Labs Reviewed  COMPREHENSIVE METABOLIC PANEL WITH GFR - Abnormal; Notable for the following components:      Result Value   Chloride 96 (*)    CO2 34 (*)    Calcium  8.7 (*)    Total Protein 6.0 (*)    Albumin 3.4 (*)    ALT 46 (*)    All other components within normal limits  CBC WITH DIFFERENTIAL/PLATELET - Abnormal; Notable for the following components:   WBC 12.3 (*)    Neutro Abs 7.9 (*)    Monocytes Absolute 1.1 (*)    Abs Immature Granulocytes 0.22 (*)    All other components within normal limits  LIPASE, BLOOD  URINALYSIS, W/  REFLEX TO CULTURE (INFECTION SUSPECTED)    EKG: EKG Interpretation Date/Time:  Tuesday March 09 2024 19:48:44 EDT Ventricular Rate:  83 PR Interval:  120 QRS Duration:  102 QT Interval:  380 QTC Calculation: 447 R Axis:   45  Text Interpretation: Sinus rhythm Right atrial enlargement Consider right ventricular hypertrophy LVH with secondary repolarization abnormality Confirmed by Laurice Coy [phone]) on 03/09/2024 7:55:26 PM  Radiology: [facility] Chest Portable 1 View Result Date: 03/09/2024 CLINICAL DATA:  Shortness of breath.  History of lung cancer. EXAM: PORTABLE CHEST 1 VIEW COMPARISON:  03/06/2024 FINDINGS: Postsurgical volume loss in the left hemithorax. Stable left apical pleuroparenchymal opacity. The heart is normal in size. Emphysema and bronchial thickening, unchanged from prior. Unchanged blunting of left costophrenic angle. No large effusion. No pneumothorax. On limited assessment, no acute osseous finding. IMPRESSION: 1. No acute findings. 2. Postsurgical volume loss in the left hemithorax. Stable left apical pleuroparenchymal opacity. 3. Emphysema and bronchial thickening. Electronically Signed   By: Andrea Gasman M.D.   On: 03/09/2024 20:08    {Document cardiac monitor, telemetry assessment procedure when appropriate:32947} Procedures    Medications Ordered in the ED  lidocaine  (LIDODERM ) 5 % 1 patch (1 patch Transdermal Patch Applied 03/09/24 2007)  morphine  (PF) 4 MG/ML injection 4 mg (4 mg Intravenous Given 03/09/24 2007)  morphine  (PF) 4 MG/ML injection 4 mg (4 mg Intravenous Given 03/09/24 2238)      {Click here for ABCD2, HEART and other calculators REFRESH Note before signing:1}                              Medical Decision Making Amount and/or Complexity of Data Reviewed Labs: ordered. Radiology: ordered.  Risk Prescription drug management.    Medical Screen Complete  This patient presented to the ED with complaint of low back pain.  This complaint involves an extensive number of treatment options. The initial differential diagnosis includes, but is not limited to, metabolic abnormality, infection, fracture, etc.  This presentation is: Acute, Chronic, Self-Limited, Previously Undiagnosed, Uncertain Prognosis, Complicated, Systemic Symptoms, and Threat to Life/Bodily Function  Patient with multiple comorbidities presents with complaint of acute on chronic low back pain.  She reports that the pain is bad enough that she can no longer ambulate.  She has been taking oxycodone  at home without control of pain.  Notably, she was admitted yesterday for COPD exacerbation.  She reports that the pain is worsened since discharge.  Patient had a CT L-spine performed on June 9th with demonstration of compression fracture of the superior endplate of L4 with less than 25% loss of height, mild anterior wedge deformity of T12, grade 1-2 posterior listhesis of S1 on L5, circumferential bulging of disc L4-L5 and L5-S1.  Patient reports minimal improvement with pain after administration of 4 mg morphine  and application of Lidoderm  patch.  Will obtain repeat CT imaging of her L-spine.  Will administer additional morphine  for pain control.  Oncoming EDP aware of case.    Co morbidities that complicated the patient's  evaluation  History of hypertension, depression, anxiety, paroxysmal A-fib on Eliquis , lung cancer status postlumpectomy, severe COPD with multiple admissions now enrolled with outpatient hospice, chronic hypoxic/hypercarbic respiratory failure    Additional history obtained:  External records from outside sources obtained and reviewed including prior ED visits and prior Inpatient records.   Problem List / ED Course:  Low Back Pain   Disposition:  After consideration of the  diagnostic results and the patients response to treatment, I feel that the patent would benefit from completion of ED evaluation.    {Document critical care time when appropriate  Document review of labs and clinical decision tools ie CHADS2VASC2, etc  Document your independent review of radiology images and any outside records  Document your discussion with family members, caretakers and with consultants  Document social determinants of health affecting pt's care  Document your decision making why or why not admission, treatments were needed:32947:::1}   Final diagnoses:  Low back pain without sciatica, unspecified back pain laterality, unspecified chronicity    ED Discharge Orders     None

## 2024-03-09 NOTE — ED Triage Notes (Signed)
 Patient presents due to shortness of breath and low back pain. She had a nebulizer treatment around 1700 today. Around 1900 she complained of shortness of breath and called EMS. EMS administered a duo neb which helped. Patient was seen here yesterday for the same. While attempting to transfer, she felt as though she twisted her back.   EMS vitals: 122/77 BP 98% SPO2 on 2L O2  (Chronic) 88 HR

## 2024-03-10 DIAGNOSIS — Z515 Encounter for palliative care: Secondary | ICD-10-CM | POA: Diagnosis not present

## 2024-03-10 DIAGNOSIS — M4856XA Collapsed vertebra, not elsewhere classified, lumbar region, initial encounter for fracture: Secondary | ICD-10-CM | POA: Diagnosis present

## 2024-03-10 DIAGNOSIS — E785 Hyperlipidemia, unspecified: Secondary | ICD-10-CM | POA: Diagnosis present

## 2024-03-10 DIAGNOSIS — S32000A Wedge compression fracture of unspecified lumbar vertebra, initial encounter for closed fracture: Secondary | ICD-10-CM | POA: Diagnosis present

## 2024-03-10 DIAGNOSIS — F17210 Nicotine dependence, cigarettes, uncomplicated: Secondary | ICD-10-CM | POA: Diagnosis present

## 2024-03-10 DIAGNOSIS — Z79899 Other long term (current) drug therapy: Secondary | ICD-10-CM | POA: Diagnosis not present

## 2024-03-10 DIAGNOSIS — Z85118 Personal history of other malignant neoplasm of bronchus and lung: Secondary | ICD-10-CM | POA: Diagnosis not present

## 2024-03-10 DIAGNOSIS — Z7901 Long term (current) use of anticoagulants: Secondary | ICD-10-CM | POA: Diagnosis not present

## 2024-03-10 DIAGNOSIS — Z9981 Dependence on supplemental oxygen: Secondary | ICD-10-CM | POA: Diagnosis not present

## 2024-03-10 DIAGNOSIS — J441 Chronic obstructive pulmonary disease with (acute) exacerbation: Secondary | ICD-10-CM | POA: Diagnosis present

## 2024-03-10 DIAGNOSIS — S32030A Wedge compression fracture of third lumbar vertebra, initial encounter for closed fracture: Secondary | ICD-10-CM | POA: Diagnosis not present

## 2024-03-10 DIAGNOSIS — E890 Postprocedural hypothyroidism: Secondary | ICD-10-CM | POA: Diagnosis present

## 2024-03-10 DIAGNOSIS — Z902 Acquired absence of lung [part of]: Secondary | ICD-10-CM | POA: Diagnosis not present

## 2024-03-10 DIAGNOSIS — F319 Bipolar disorder, unspecified: Secondary | ICD-10-CM | POA: Diagnosis present

## 2024-03-10 DIAGNOSIS — I10 Essential (primary) hypertension: Secondary | ICD-10-CM | POA: Diagnosis present

## 2024-03-10 DIAGNOSIS — S32050A Wedge compression fracture of fifth lumbar vertebra, initial encounter for closed fracture: Secondary | ICD-10-CM | POA: Diagnosis present

## 2024-03-10 DIAGNOSIS — J9611 Chronic respiratory failure with hypoxia: Secondary | ICD-10-CM | POA: Diagnosis present

## 2024-03-10 DIAGNOSIS — I252 Old myocardial infarction: Secondary | ICD-10-CM | POA: Diagnosis not present

## 2024-03-10 DIAGNOSIS — I480 Paroxysmal atrial fibrillation: Secondary | ICD-10-CM | POA: Diagnosis present

## 2024-03-10 DIAGNOSIS — Z66 Do not resuscitate: Secondary | ICD-10-CM | POA: Diagnosis present

## 2024-03-10 DIAGNOSIS — Z79890 Hormone replacement therapy (postmenopausal): Secondary | ICD-10-CM | POA: Diagnosis not present

## 2024-03-10 LAB — COMPREHENSIVE METABOLIC PANEL WITH GFR
ALT: 39 U/L (ref 0–44)
AST: 22 U/L (ref 15–41)
Albumin: 3.1 g/dL — ABNORMAL LOW (ref 3.5–5.0)
Alkaline Phosphatase: 87 U/L (ref 38–126)
Anion gap: 10 (ref 5–15)
BUN: 15 mg/dL (ref 8–23)
CO2: 34 mmol/L — ABNORMAL HIGH (ref 22–32)
Calcium: 8.8 mg/dL — ABNORMAL LOW (ref 8.9–10.3)
Chloride: 94 mmol/L — ABNORMAL LOW (ref 98–111)
Creatinine, Ser: 0.46 mg/dL (ref 0.44–1.00)
GFR, Estimated: >60 mL/min (ref >60)
Glucose, Bld: 76 mg/dL (ref 70–99)
Potassium: 3.6 mmol/L (ref 3.5–5.1)
Sodium: 138 mmol/L (ref 135–145)
Total Bilirubin: 0.6 mg/dL (ref 0.0–1.2)
Total Protein: 6.1 g/dL — ABNORMAL LOW (ref 6.5–8.1)

## 2024-03-10 LAB — CBC WITH DIFFERENTIAL/PLATELET
Abs Immature Granulocytes: 0.23 10*3/uL — ABNORMAL HIGH (ref 0.00–0.07)
Basophils Absolute: 0 10*3/uL (ref 0.0–0.1)
Basophils Relative: 0 %
Eosinophils Absolute: 0 10*3/uL (ref 0.0–0.5)
Eosinophils Relative: 0 %
HCT: 42.2 % (ref 36.0–46.0)
Hemoglobin: 13.2 g/dL (ref 12.0–15.0)
Immature Granulocytes: 2 %
Lymphocytes Relative: 18 %
Lymphs Abs: 2.3 10*3/uL (ref 0.7–4.0)
MCH: 29.3 pg (ref 26.0–34.0)
MCHC: 31.3 g/dL (ref 30.0–36.0)
MCV: 93.6 fL (ref 80.0–100.0)
Monocytes Absolute: 1.1 10*3/uL — ABNORMAL HIGH (ref 0.1–1.0)
Monocytes Relative: 8 %
Neutro Abs: 9 10*3/uL — ABNORMAL HIGH (ref 1.7–7.7)
Neutrophils Relative %: 72 %
Platelets: 265 10*3/uL (ref 150–400)
RBC: 4.51 MIL/uL (ref 3.87–5.11)
RDW: 14 % (ref 11.5–15.5)
WBC: 12.7 10*3/uL — ABNORMAL HIGH (ref 4.0–10.5)
nRBC: 0 % (ref 0.0–0.2)

## 2024-03-10 LAB — MAGNESIUM: Magnesium: 2 mg/dL (ref 1.7–2.4)

## 2024-03-10 MED ORDER — IPRATROPIUM-ALBUTEROL 0.5-2.5 (3) MG/3ML IN SOLN
3.0000 mL | Freq: Three times a day (TID) | RESPIRATORY_TRACT | Status: DC
  Administered 2024-03-10 – 2024-03-23 (×39): 3 mL via RESPIRATORY_TRACT
  Filled 2024-03-10 (×39): qty 3

## 2024-03-10 MED ORDER — ROSUVASTATIN CALCIUM 20 MG PO TABS
20.0000 mg | ORAL_TABLET | Freq: Every day | ORAL | Status: DC
  Administered 2024-03-10 – 2024-03-23 (×14): 20 mg via ORAL
  Filled 2024-03-10 (×14): qty 1

## 2024-03-10 MED ORDER — APIXABAN 5 MG PO TABS
5.0000 mg | ORAL_TABLET | Freq: Two times a day (BID) | ORAL | Status: AC
  Administered 2024-03-10 – 2024-03-14 (×10): 5 mg via ORAL
  Filled 2024-03-10 (×4): qty 1
  Filled 2024-03-10: qty 2
  Filled 2024-03-10 (×5): qty 1

## 2024-03-10 MED ORDER — NALOXONE HCL 0.4 MG/ML IJ SOLN: 0.4000 mg | INTRAMUSCULAR | Status: DC | PRN

## 2024-03-10 MED ORDER — ACETAMINOPHEN 650 MG RE SUPP: 650.0000 mg | Freq: Four times a day (QID) | RECTAL | Status: DC | PRN

## 2024-03-10 MED ORDER — ALBUTEROL SULFATE (2.5 MG/3ML) 0.083% IN NEBU
2.5000 mg | INHALATION_SOLUTION | RESPIRATORY_TRACT | Status: DC | PRN
  Filled 2024-03-10: qty 3

## 2024-03-10 MED ORDER — MELATONIN 3 MG PO TABS
3.0000 mg | ORAL_TABLET | Freq: Every evening | ORAL | Status: DC | PRN
  Administered 2024-03-12 – 2024-03-18 (×5): 3 mg via ORAL
  Filled 2024-03-10 (×5): qty 1

## 2024-03-10 MED ORDER — ACETAMINOPHEN 325 MG PO TABS
650.0000 mg | ORAL_TABLET | Freq: Four times a day (QID) | ORAL | Status: DC | PRN
  Administered 2024-03-21 – 2024-03-22 (×3): 650 mg via ORAL
  Filled 2024-03-10 (×4): qty 2

## 2024-03-10 MED ORDER — CALCITONIN (SALMON) 200 UNIT/ACT NA SOLN
1.0000 | Freq: Every day | NASAL | Status: DC
  Administered 2024-03-10 – 2024-03-23 (×14): 1 via NASAL
  Filled 2024-03-10 (×2): qty 3.7

## 2024-03-10 MED ORDER — POLYVINYL ALCOHOL 1.4 % OP SOLN: 1.0000 [drp] | OPHTHALMIC | Status: DC | PRN

## 2024-03-10 MED ORDER — OXYCODONE-ACETAMINOPHEN 5-325 MG PO TABS
1.0000 | ORAL_TABLET | Freq: Once | ORAL | Status: AC
  Administered 2024-03-10: 1 via ORAL
  Filled 2024-03-10: qty 1

## 2024-03-10 MED ORDER — METHOCARBAMOL 500 MG PO TABS: 500.0000 mg | ORAL_TABLET | Freq: Four times a day (QID) | ORAL | Status: DC | PRN

## 2024-03-10 MED ORDER — BUDESON-GLYCOPYRROL-FORMOTEROL 160-9-4.8 MCG/ACT IN AERO
2.0000 | INHALATION_SPRAY | Freq: Three times a day (TID) | RESPIRATORY_TRACT | Status: DC
  Filled 2024-03-10: qty 5.9

## 2024-03-10 MED ORDER — PREDNISONE 5 MG PO TABS
10.0000 mg | ORAL_TABLET | Freq: Every day | ORAL | Status: AC
  Administered 2024-03-17 – 2024-03-19 (×3): 10 mg via ORAL
  Filled 2024-03-10 (×4): qty 2

## 2024-03-10 MED ORDER — OXYCODONE HCL 5 MG PO TABS
10.0000 mg | ORAL_TABLET | ORAL | Status: DC | PRN
  Administered 2024-03-10 – 2024-03-23 (×40): 10 mg via ORAL
  Filled 2024-03-10 (×40): qty 2

## 2024-03-10 MED ORDER — PREDNISONE 20 MG PO TABS
40.0000 mg | ORAL_TABLET | Freq: Every day | ORAL | Status: AC
  Administered 2024-03-11 – 2024-03-13 (×3): 40 mg via ORAL
  Filled 2024-03-10 (×3): qty 2

## 2024-03-10 MED ORDER — LOSARTAN POTASSIUM 25 MG PO TABS
12.5000 mg | ORAL_TABLET | Freq: Every day | ORAL | Status: DC
  Administered 2024-03-10 – 2024-03-23 (×14): 12.5 mg via ORAL
  Filled 2024-03-10 (×14): qty 1

## 2024-03-10 MED ORDER — OXYCODONE-ACETAMINOPHEN 5-325 MG PO TABS: 1.0000 | ORAL_TABLET | Freq: Four times a day (QID) | ORAL | Status: DC | PRN

## 2024-03-10 MED ORDER — PREDNISOLONE ACETATE 1 % OP SUSP
1.0000 [drp] | Freq: Four times a day (QID) | OPHTHALMIC | Status: DC
  Administered 2024-03-10 – 2024-03-23 (×51): 1 [drp] via OPHTHALMIC
  Filled 2024-03-10 (×2): qty 5

## 2024-03-10 MED ORDER — MORPHINE SULFATE (PF) 4 MG/ML IV SOLN: 4.0000 mg | INTRAVENOUS | Status: DC | PRN

## 2024-03-10 MED ORDER — BUDESON-GLYCOPYRROL-FORMOTEROL 160-9-4.8 MCG/ACT IN AERO
2.0000 | INHALATION_SPRAY | Freq: Two times a day (BID) | RESPIRATORY_TRACT | Status: DC
  Administered 2024-03-11 – 2024-03-23 (×24): 2 via RESPIRATORY_TRACT
  Filled 2024-03-10: qty 5.9

## 2024-03-10 MED ORDER — IPRATROPIUM-ALBUTEROL 0.5-2.5 (3) MG/3ML IN SOLN: 3.0000 mL | Freq: Four times a day (QID) | RESPIRATORY_TRACT | Status: DC

## 2024-03-10 MED ORDER — LIDOCAINE 5 % EX PTCH
3.0000 | MEDICATED_PATCH | CUTANEOUS | Status: DC
  Administered 2024-03-10 – 2024-03-13 (×3): 3 via TRANSDERMAL
  Filled 2024-03-10 (×11): qty 3

## 2024-03-10 MED ORDER — ACETAMINOPHEN 500 MG PO TABS
1000.0000 mg | ORAL_TABLET | Freq: Three times a day (TID) | ORAL | Status: AC
  Administered 2024-03-10 – 2024-03-16 (×21): 1000 mg via ORAL
  Filled 2024-03-10 (×21): qty 2

## 2024-03-10 MED ORDER — OXYCODONE HCL 5 MG PO TABS: 10.0000 mg | ORAL_TABLET | ORAL | Status: DC | PRN

## 2024-03-10 MED ORDER — POLYETHYLENE GLYCOL 3350 17 G PO PACK
17.0000 g | PACK | Freq: Two times a day (BID) | ORAL | Status: DC
  Administered 2024-03-10 – 2024-03-23 (×8): 17 g via ORAL
  Filled 2024-03-10 (×15): qty 1

## 2024-03-10 MED ORDER — MAGNESIUM OXIDE -MG SUPPLEMENT 400 (240 MG) MG PO TABS
400.0000 mg | ORAL_TABLET | Freq: Two times a day (BID) | ORAL | Status: DC
  Administered 2024-03-10 – 2024-03-23 (×27): 400 mg via ORAL
  Filled 2024-03-10 (×27): qty 1

## 2024-03-10 MED ORDER — ACETAMINOPHEN 325 MG PO TABS: 650.0000 mg | ORAL_TABLET | Freq: Four times a day (QID) | ORAL | Status: DC | PRN

## 2024-03-10 MED ORDER — METOPROLOL SUCCINATE ER 25 MG PO TB24
12.5000 mg | ORAL_TABLET | Freq: Every day | ORAL | Status: DC
  Administered 2024-03-10 – 2024-03-23 (×14): 12.5 mg via ORAL
  Filled 2024-03-10 (×14): qty 1

## 2024-03-10 MED ORDER — PREDNISONE 20 MG PO TABS
20.0000 mg | ORAL_TABLET | Freq: Every day | ORAL | Status: AC
  Administered 2024-03-14 – 2024-03-16 (×3): 20 mg via ORAL
  Filled 2024-03-10 (×3): qty 1

## 2024-03-10 MED ORDER — LEVOTHYROXINE SODIUM 25 MCG PO TABS: 125.0000 ug | ORAL_TABLET | Freq: Every day | ORAL | Status: DC

## 2024-03-10 MED ORDER — ONDANSETRON HCL 4 MG/2ML IJ SOLN
4.0000 mg | Freq: Four times a day (QID) | INTRAMUSCULAR | Status: DC | PRN
  Administered 2024-03-10: 4 mg via INTRAVENOUS
  Filled 2024-03-10: qty 2

## 2024-03-10 MED ORDER — MORPHINE SULFATE (PF) 4 MG/ML IV SOLN
4.0000 mg | INTRAVENOUS | Status: DC | PRN
  Administered 2024-03-10 – 2024-03-18 (×8): 4 mg via INTRAVENOUS
  Filled 2024-03-10 (×8): qty 1

## 2024-03-10 MED ORDER — PREDNISONE 5 MG PO TABS: 10.0000 mg | ORAL_TABLET | Freq: Every day | ORAL | Status: DC

## 2024-03-10 MED ORDER — CYCLOBENZAPRINE HCL 10 MG PO TABS
10.0000 mg | ORAL_TABLET | Freq: Two times a day (BID) | ORAL | Status: DC | PRN
  Administered 2024-03-10 – 2024-03-21 (×14): 10 mg via ORAL
  Filled 2024-03-10 (×15): qty 1

## 2024-03-10 MED ORDER — OXYCODONE HCL 5 MG PO TABS
5.0000 mg | ORAL_TABLET | ORAL | Status: DC | PRN
  Administered 2024-03-15 – 2024-03-16 (×2): 5 mg via ORAL
  Filled 2024-03-10 (×4): qty 1

## 2024-03-10 MED ORDER — SERTRALINE HCL 25 MG PO TABS
25.0000 mg | ORAL_TABLET | Freq: Every morning | ORAL | Status: DC
  Administered 2024-03-10 – 2024-03-23 (×13): 25 mg via ORAL
  Filled 2024-03-10 (×15): qty 1

## 2024-03-10 MED ORDER — CYCLOBENZAPRINE HCL 10 MG PO TABS: 5.0000 mg | ORAL_TABLET | Freq: Three times a day (TID) | ORAL | Status: DC

## 2024-03-10 MED ORDER — LEVOTHYROXINE SODIUM 125 MCG PO TABS
125.0000 ug | ORAL_TABLET | Freq: Every day | ORAL | Status: DC
  Administered 2024-03-10 – 2024-03-23 (×13): 125 ug via ORAL
  Filled 2024-03-10 (×14): qty 1

## 2024-03-10 NOTE — ED Notes (Signed)
 Called ortho tech for Architectural technologist no answer.  Will try again shortly

## 2024-03-10 NOTE — Plan of Care (Signed)
   Problem: Activity: Goal: Risk for activity intolerance will decrease Outcome: Progressing   Problem: Nutrition: Goal: Adequate nutrition will be maintained Outcome: Progressing   Problem: Coping: Goal: Level of anxiety will decrease Outcome: Progressing   Problem: Safety: Goal: Ability to remain free from injury will improve Outcome: Progressing   Problem: Skin Integrity: Goal: Risk for impaired skin integrity will decrease Outcome: Progressing

## 2024-03-10 NOTE — Progress Notes (Signed)
 Orthopedic Tech Progress Note Patient Details:  Tracy Park 02-Jun-1956 [id number]  Patient ID: Tracy Park, female   DOB: Jul 07, 1956, 68 y.o.   MRN: [id number]  Tracy Park 03/10/2024, 8:41 AM LSO delivered to room. Will apply when pt OOB

## 2024-03-10 NOTE — Evaluation (Signed)
 Physical Therapy Evaluation Patient Details Name: Tracy Park MRN: [id number] DOB: December 22, 1955 Today's Date: 03/10/2024  History of Present Illness  68 year old female re-admitted to [facility] on 03/09/24 after beginning home hospice care for pain control as well as PT/OT in the setting of acute L3 compression fracture after presenting with acute worsening of her low back discomfort when attempting to sit down.  Previous recent admission on 03/02/24 for Acute COPD exacerbation.  Past medical history of hypertension, depression, anxiety, paroxysmal A-fib on Eliquis , lung cancer status post lumpectomy, severe COPD with multiple admissions now enrolled with outpatient hospice, chronic hypoxic/hypercarbic respiratory failure.  Clinical Impression  Pt admitted with above diagnosis.  Pt currently with functional limitations due to the deficits listed below (see PT Problem List). Pt will benefit from acute skilled PT to increase their independence and safety with mobility to allow discharge.     The patient  instructed in use of LSO, reports that she  as had similar in past. Patient required encouragement to mobilize. Assisted to sitting and standing and don  LSO. Patient reports plans to return home.  Recommend HHPT if accepting.      If plan is discharge home, recommend the following: A little help with walking and/or transfers;A little help with bathing/dressing/bathroom;Assistance with cooking/housework;Assist for transportation;Help with stairs or ramp for entrance   Can travel by private vehicle        Equipment Recommendations None recommended by PT  Recommendations for Other Services       Functional Status Assessment Patient has had a recent decline in their functional status and demonstrates the ability to make significant improvements in function in a reasonable and predictable amount of time.     Precautions / Restrictions Precautions Precautions: Fall Precaution/Restrictions Comments: 2L  O2 [place] baseline Required Braces or Orthoses: Spinal Brace Spinal Brace: Lumbar corset;Applied in sitting position Restrictions Weight Bearing Restrictions Per Provider Order: No      Mobility  Bed Mobility           Sit to supine: Supervision   General bed mobility comments: did not  follow instructions to return to side and log roll    Transfers Overall transfer level: Needs assistance Equipment used: 1 person hand held assist Transfers: Sit to/from Stand Sit to Stand: Min assist           General transfer comment: patient supporting on stretcher with both hands when standing for pericare    Ambulation/Gait                  Stairs            Wheelchair Mobility     Tilt Bed    Modified Rankin (Stroke Patients Only)       Balance                                             Pertinent Vitals/Pain Pain Assessment Pain Score: 8  Pain Location: back Pain Descriptors / Indicators: Discomfort Pain Intervention(s): Monitored during session, Premedicated before session, Patient requesting pain meds-RN notified    Home Living                     Additional Comments: On 2 L O2 at home 24/7    Prior Function Prior Level of Function : Independent/Modified Independent  Mobility Comments: In home ambulates with rollator ADLs Comments: Ind with ADLs/selfcare, IADLs, home mgt, cooking, no longer drives; can go out in community but typically family does shopping     Extremity/Trunk Assessment   Upper Extremity Assessment Upper Extremity Assessment: Overall WFL for tasks assessed    Lower Extremity Assessment Lower Extremity Assessment: Generalized weakness    Cervical / Trunk Assessment Cervical / Trunk Assessment: Kyphotic  Communication   Communication Communication: No apparent difficulties    Cognition Arousal: Alert Behavior During Therapy: Flat affect   PT - Cognitive impairments: No  apparent impairments                         Following commands: Intact       Cueing       General Comments      Exercises     Assessment/Plan    PT Assessment Patient needs continued PT services  PT Problem List Decreased strength;Decreased mobility;Decreased safety awareness;Decreased knowledge of precautions;Cardiopulmonary status limiting activity;Pain       PT Treatment Interventions DME instruction;Therapeutic activities;Gait training;Therapeutic exercise;Patient/family education;Functional mobility training    PT Goals (Current goals can be found in the Care Plan section)  Acute Rehab PT Goals Patient Stated Goal: go home PT Goal Formulation: With patient Time For Goal Achievement: 03/24/24 Potential to Achieve Goals: Good    Frequency Min 3X/week     Co-evaluation PT/OT/SLP Co-Evaluation/Treatment: Yes Reason for Co-Treatment: For patient/therapist safety PT goals addressed during session: Mobility/safety with mobility OT goals addressed during session: ADL's and self-care       AM-PAC PT 6 Clicks Mobility  Outcome Measure Help needed turning from your back to your side while in a flat bed without using bedrails?: A Little Help needed moving from lying on your back to sitting on the side of a flat bed without using bedrails?: A Little Help needed moving to and from a bed to a chair (including a wheelchair)?: A Little Help needed standing up from a chair using your arms (e.g., wheelchair or bedside chair)?: A Little Help needed to walk in hospital room?: Total Help needed climbing 3-5 steps with a railing? : Total 6 Click Score: 14    End of Session Equipment Utilized During Treatment: Oxygen  Activity Tolerance: Patient limited by pain Patient left: in bed;with call bell/phone within reach Nurse Communication: Mobility status PT Visit Diagnosis: Difficulty in walking, not elsewhere classified (R26.2)    Time: 8944-8885 PT Time  Calculation (min) (ACUTE ONLY): 19 min   Charges:   PT Evaluation $PT Eval Low Complexity: 1 Low   PT General Charges $$ ACUTE PT VISIT: 1 Visit         Darice Potters PT Acute Rehabilitation Services Office [phone]   Potters Darice Norris 03/10/2024, 3:06 PM

## 2024-03-10 NOTE — ED Notes (Signed)
 Physical therapy currently in the room with pt will take pt up to rm once physical therapy is done.

## 2024-03-10 NOTE — ED Notes (Signed)
 Pt received breakfast tray

## 2024-03-10 NOTE — H&P (Addendum)
 History and Physical    [name] FMW:[id number] DOB: [birth year] DOA: 03/09/2024  PCP: Corlis Longs, FNP  Patient coming from: home  I have personally briefly reviewed patient's old medical records in [facility]  Chief Complaint: back pain  HPI: Tracy Park is Tracy Park 68 y.o. female with medical history significant of COPD, adenocarcinoma of the lung s/p LU lobectomy, hypothyroidism, anxiety, PTSD, atrial fib and multiple other medical issues here with back pain.   She was recently admitted for Kannen Moxey COPD exacerbation.  On the day of discharge, as she was transferring from the bed to the wheelchair she noted back pain.  She says she does not know what happened.  She notes pain.  She describes pain as sharp, constant, in her lower back.  It is worse with movement.  She has not been able to move much because of it.  Oxycodone  helps Zadaya Cuadra little bit.  She denies any numbness, tingling, or weakness.  Denies any incontinence or change in bladder or bowel or bladder dysfunction.  She notes sweats due to the pain.  She has had Yedidya Duddy chronic cough.  She has been enrolled in hospice for about Toriann Spadoni week.  She smokes, does not want Marisal Swarey nicotine  patch.  No drinking.  ED Course: Labs, imaging.  IV morphine .  Carryover admission to hospitalists.   Review of Systems: As per HPI otherwise all other systems reviewed and are negative.  Past Medical History:  Diagnosis Date   Adenocarcinoma of lung (HCC) 02/26/2022   Anginal pain (HCC)    Anxiety    Bipolar disorder (HCC)    Cancer (HCC)    COPD (chronic obstructive pulmonary disease) (HCC)    Dyspnea    Family history of adverse reaction to anesthesia    History of depression 08/03/2023   History of kidney stones    Hydroureteronephrosis 08/16/2021   Hypothyroidism    Lung cancer (HCC)    Malignant neoplasm of upper lobe of left lung (HCC) 11/12/2016   Myocardial infarction (HCC)    Paroxysmal atrial fibrillation (HCC)    Paroxysmal atrial fibrillation  with RVR (HCC) 01/25/2023   PTSD (post-traumatic stress disorder)    Rib pain on left side 06/24/2023   Sleep apnea    Thyroid  disease     Past Surgical History:  Procedure Laterality Date   ABDOMINAL HYSTERECTOMY     BACK SURGERY     CYSTOSCOPY W/ URETERAL STENT PLACEMENT Right 05/03/2021   Procedure: CYSTOSCOPY WITH RETROGRADE PYELOGRAM/URETERAL STENT PLACEMENT;  Surgeon: Cam Morene ORN, MD;  Location: [facility];  Service: Urology;  Laterality: Right;   EYE SURGERY     kidney stent     thyroidectomy      Social History  reports that she has been smoking cigarettes. She has never used smokeless tobacco. She reports that she does not currently use alcohol . She reports that she does not currently use drugs.  Allergies  Allergen Reactions   Red Dye #40 (Allura Red) Hives, Itching and Other (See Comments)    Red food dye   Strawberry Extract Hives and Itching   Tomato Hives and Itching   Aspirin Hives   Tape Rash and Other (See Comments)    Prefers paper tape   Wound Dressing Adhesive Rash    Family History  Problem Relation Age of Onset   Hypertension Mother    Heart failure Mother    Hypertension Father    Diabetes Father    Heart failure Father  Prior to Admission medications   Medication Sig Start Date End Date Taking? Authorizing Provider  albuterol  (VENTOLIN  HFA) 108 (90 Base) MCG/ACT inhaler Inhale 2 puffs into the lungs every 6 (six) hours as needed for wheezing or shortness of breath. 03/08/24  Yes Jillian Buttery, MD  VOLTAREN ARTHRITIS PAIN 1 % GEL Apply 2 g topically every 6 (six) hours as needed (pain). 03/09/24  Yes [provider]  apixaban  (ELIQUIS ) 5 MG TABS tablet Take 1 tablet (5 mg total) by mouth 2 (two) times daily. Patient not taking: Reported on 03/03/2024 06/06/23   Barbarann Nest, MD  budesonide -glycopyrrolate -formoterol  (BREZTRI  AEROSPHERE) 160-9-4.8 MCG/ACT AERO inhaler Inhale 2 puffs into the lungs in the morning, at noon, and at  bedtime. 03/08/24   Jillian Buttery, MD  cyclobenzaprine  (FLEXERIL ) 10 MG tablet Take 1 tablet (10 mg total) by mouth 2 (two) times daily as needed for muscle spasms. Patient taking differently: Take 10 mg by mouth daily. 02/10/24   Dreama Longs, MD  guaiFENesin  (MUCINEX ) 600 MG 12 hr tablet Take 1 tablet (600 mg total) by mouth 2 (two) times daily. 03/08/24 03/08/25  Jillian Buttery, MD  ipratropium-albuterol  (DUONEB) 0.5-2.5 (3) MG/3ML SOLN Take 3 mLs by nebulization 3 (three) times daily. 02/29/24   [provider]  levothyroxine  (SYNTHROID ) 125 MCG tablet Take 125 mcg by mouth daily before breakfast.    [provider]  lidocaine  (LIDODERM ) 5 % Place 1 patch onto the skin daily. Remove & Discard patch within 12 hours or as directed by MD Patient not taking: Reported on 03/03/2024 02/10/24   Dreama Longs, MD  losartan  (COZAAR ) 25 MG tablet Take 12.5 mg by mouth daily. 02/09/24   [provider]  magnesium  oxide (MAG-OX) 400 (240 Mg) MG tablet Take 400 mg by mouth 2 (two) times daily.    [provider]  metoprolol  succinate (TOPROL -XL) 25 MG 24 hr tablet Take 12.5 mg by mouth daily.    [provider]  Morphine  Sulfate (MORPHINE  CONCENTRATE) 10 mg / 0.5 ml concentrated solution SMARTSIG:0.25 Milliliter(s) By Mouth Every 4 Hours PRN 02/19/24   [provider]  nitroGLYCERIN  (NITROSTAT ) 0.4 MG SL tablet Place 0.4 mg under the tongue every 5 (five) minutes as needed for chest pain.    [provider]  oxyCODONE  (ROXICODONE ) 5 MG immediate release tablet Take 1 tablet (5 mg total) by mouth every 4 (four) hours as needed for severe pain (pain score 7-10). 02/10/24   Dreama Longs, MD  OXYGEN  Inhale 2 L/min into the lungs continuous.    [provider]  polyvinyl alcohol  (LIQUIFILM TEARS) 1.4 % ophthalmic solution Place 1 drop into the left eye in the morning, at noon, in the evening, and at bedtime.    [provider]   prednisoLONE  acetate (PRED FORTE ) 1 % ophthalmic suspension Place 1 drop into the left eye 4 (four) times daily. 11/19/23   [provider]  predniSONE  (DELTASONE ) 10 MG tablet Take 4 pills daily for 2 days then 3 pills daily for 3 days then 2 pills daily for 3 days then continue taking 1 pill daily 03/09/24   Jillian Buttery, MD  rosuvastatin  (CRESTOR ) 20 MG tablet Take 20 mg by mouth daily.    [provider]  sertraline  (ZOLOFT ) 25 MG tablet Take 25 mg by mouth in the morning.    [provider]    Physical Exam: Vitals:   03/10/24 0700 03/10/24 0800 03/10/24 0806 03/10/24 0834  BP: (!) 146/84 (!) 159/78  135/89  Pulse: 70 67  70  Resp: 17 20  18   Temp:    97.9 F (36.6 C)  TempSrc:      SpO2: 98% 98% 98% 99%    Constitutional: NAD, calm, comfortable Vitals:   03/10/24 0700 03/10/24 0800 03/10/24 0806 03/10/24 0834  BP: (!) 146/84 (!) 159/78  135/89  Pulse: 70 67  70  Resp: 17 20  18   Temp:    97.9 F (36.6 C)  TempSrc:      SpO2: 98% 98% 98% 99%   Eyes: PERRL ENMT: Mucous membranes are moist. Neck: normal, supple Respiratory: scattered wheezing diffuse Cardiovascular: RRR Abdomen: no tenderness, no masses palpated. Musculoskeletal: unable to turn due to pain for Agnes Probert back exam Skin: no rashes, lesions Neurologic: CN 2-12 grossly intact. Moving all extremities.  Psychiatric: Normal judgment and insight. Alert and oriented x 3. Normal mood.   Labs on Admission: I have personally reviewed following labs and imaging studies  CBC: Recent Labs  Lab 03/04/24 0323 03/05/24 0318 03/07/24 0329 03/09/24 1954 03/10/24 0748  WBC 9.7 10.2 9.8 12.3* 12.7*  NEUTROABS  --   --   --  7.9* 9.0*  HGB 12.1 11.3* 11.6* 13.1 13.2  HCT 39.2 37.2 37.5 41.7 42.2  MCV 96.1 97.4 95.4 92.5 93.6  PLT 257 238 219 274 265    Basic Metabolic Panel: Recent Labs  Lab 03/04/24 0323 03/05/24 0318 03/07/24 0329 03/09/24 1954 03/10/24 0748  NA 139 140 136 138 138   K 4.2 4.6 4.7 4.0 3.6  CL 98 98 98 96* 94*  CO2 31 34* 30 34* 34*  GLUCOSE 212* 173* 185* 82 76  BUN 20 33* 33* 16 15  CREATININE 0.82 0.81 0.66 0.44 0.46  CALCIUM  9.0 8.9 8.6* 8.7* 8.8*  MG  --   --   --   --  2.0    GFR: Estimated Creatinine Clearance: 55.2 mL/min (by C-G formula based on SCr of 0.46 mg/dL).  Liver Function Tests: Recent Labs  Lab 03/09/24 1954 03/10/24 0748  AST 33 22  ALT 46* 39  ALKPHOS 84 87  BILITOT 0.9 0.6  PROT 6.0* 6.1*  ALBUMIN 3.4* 3.1*    Urine analysis:    Component Value Date/Time   COLORURINE YELLOW 03/09/2024 2252   APPEARANCEUR HAZY (Soledad Budreau) 03/09/2024 2252   LABSPEC 1.013 03/09/2024 2252   PHURINE 7.0 03/09/2024 2252   GLUCOSEU NEGATIVE 03/09/2024 2252   HGBUR NEGATIVE 03/09/2024 2252   BILIRUBINUR NEGATIVE 03/09/2024 2252   KETONESUR NEGATIVE 03/09/2024 2252   PROTEINUR NEGATIVE 03/09/2024 2252   UROBILINOGEN 0.2 11/06/2009 1559   NITRITE NEGATIVE 03/09/2024 2252   LEUKOCYTESUR NEGATIVE 03/09/2024 2252    Radiological Exams on Admission: CT Lumbar Spine Wo Contrast Result Date: 03/10/2024 CLINICAL DATA:  Low back pain, increased fracture risk. EXAM: CT LUMBAR SPINE WITHOUT CONTRAST TECHNIQUE: Multidetector CT imaging of the lumbar spine was performed without intravenous contrast administration. Multiplanar CT image reconstructions were also generated. RADIATION DOSE REDUCTION: This exam was performed according to the departmental dose-optimization program which includes automated exposure control, adjustment of the mA and/or kV according to patient size and/or use of iterative reconstruction technique. COMPARISON:  None Available. FINDINGS: Segmentation: Transitional lumbosacral anatomy. Two be consistent with the prior numbering system, there is Mikle Sternberg transitional vertebral body that is considered to be S1. Alignment: Grade II anterolisthesis of L5 on S1. Vertebrae: Similar L4 superior endplate fracture with approximately 25% height loss.  Acute L3 superior endplate fracture  with 25% height loss and trace bony retropulsion, new since June 9th. Osteopenia. Paraspinal and other soft tissues: Negative. Disc levels: Similar lower lumbar facet arthropathy. Disc bulge at L5-S1 with at least mild canal stenosis. IMPRESSION: 1. Acute L3 superior endplate fracture with 25% height loss and trace bony retropulsion, new since June 9th. 2. Similar L4 superior endplate fracture with 25% height loss. 3. Similar grade II anterolisthesis of L5 on S1. 4. Transitional lumbosacral anatomy. 5. Osteopenia. Electronically Signed   By: Gilmore GORMAN Molt M.D.   On: 03/10/2024 00:42   DG Chest Portable 1 View Result Date: 03/09/2024 CLINICAL DATA:  Shortness of breath.  History of lung cancer. EXAM: PORTABLE CHEST 1 VIEW COMPARISON:  03/06/2024 FINDINGS: Postsurgical volume loss in the left hemithorax. Stable left apical pleuroparenchymal opacity. The heart is normal in size. Emphysema and bronchial thickening, unchanged from prior. Unchanged blunting of left costophrenic angle. No large effusion. No pneumothorax. On limited assessment, no acute osseous finding. IMPRESSION: 1. No acute findings. 2. Postsurgical volume loss in the left hemithorax. Stable left apical pleuroparenchymal opacity. 3. Emphysema and bronchial thickening. Electronically Signed   By: Andrea Gasman M.D.   On: 03/09/2024 20:08    EKG: Independently reviewed. Sinus, RAE, IVCD - similar to priors  Assessment/Plan Principal Problem:   Closed compression fracture of L3 vertebra (HCC)    Assessment and Plan:  L3 Compression Fracture  Reportedly occurred with transferring from bed to chair CT with L3 superior endplate fracture with 25% height loss and trace bony retropulsion, new since June 9th.  Similar L4 superior endplate fracture with 25% height loss.  Similar grade II anterolisthesis of L5 on S1. Scheduled APAP, lidocaine  patches, flexeril , calcitonin nasal spray, oxycodone  prn,  morphine  prn.  PT/OT Pain limited my direct exam of her back today, would try again when pain better controlled  COPD Exacerbation  Chronic Hypoxic Respiratory Failure on 2 L  Wheezing on exam, will give quick steroid taper Remains on 2 L  CXR without acute findings Continue breztri ,   Hx Lung Cancer S/p LU lobectomy   Paroxysmal Atrial Fibrillation Continue eliquis  for now (hospice may have d/c'd this outpatient?) Metoprolol    Hypothyroidism Synthroid   Hypertension Losartan  Metoprolol   Dyslipidemia Crestor   Depression Zoloft   Goals of Care Currently enrolled in hospice.  When we discussed code status, she initially said CPR, no intubation - given education about that, typically CPR/intubation paired procedures.  We talked about her prior DNR status and she noted her family didn't like that, wanted her to be full code.  She was uncertain during our conversation, discussed with her chronic medical illnesses and hospice, DNR reasonable and what I would recommend, but we can allow her time to discuss with hospice (or palliative care) and think about things.  Full code for now.      DVT prophylaxis: eliquis   Code Status:   full  Family Communication:  none  Disposition Plan:   Patient is from:  home  Anticipated DC to:  home  Anticipated DC date:  7/3  Anticipated DC barriers: none  Consults called:  none  Admission status:  obs   Severity of Illness: The appropriate patient status for this patient is OBSERVATION. Observation status is judged to be reasonable and necessary in order to provide the required intensity of service to ensure the patient's safety. The patient's presenting symptoms, physical exam findings, and initial radiographic and laboratory data in the context of their medical condition is felt to place them at  decreased risk for further clinical deterioration. Furthermore, it is anticipated that the patient will be medically stable for discharge from the  hospital within 2 midnights of admission.     Meliton Monte MD [facility]  How to contact the [facility] Attending or Consulting provider 7A - 7P or covering provider during after hours 7P -7A, for this patient?   Check the care team in [facility] and look for Moris Ratchford) attending/consulting [facility] provider listed and b) the [facility] team listed Log into [email or web address] and use [facility] universal password to access. If you do not have the password, please contact the hospital operator. Locate the [facility] provider you are looking for under [facility] and page to Aleka Twitty number that you can be directly reached. If you still have difficulty reaching the provider, please page the [facility] (Director on Call) for the Hospitalists listed on amion for assistance.  03/10/2024, 9:15 AM

## 2024-03-10 NOTE — Evaluation (Signed)
 Occupational Therapy Evaluation Patient Details Name: Tracy Park MRN: [id number] DOB: 1956/09/08 Today's Date: 03/10/2024   History of Present Illness   68 year old female re-admitted to [facility] on 03/09/24 after beginning home hospice care for pain control as well as PT/OT in the setting of acute L3 compression fracture after presenting with acute worsening of her low back discomfort when attempting to sit down.  Previous recent admission on 03/02/24 for Acute COPD exacerbation.  Past medical history of hypertension, depression, anxiety, paroxysmal A-fib on Eliquis , lung cancer status post lumpectomy, severe COPD with multiple admissions now enrolled with outpatient hospice, chronic hypoxic/hypercarbic respiratory failure.     Clinical Impressions Patient is currently requiring as high as minimal assistance with basic ADLs but not all observed such as LE dressing due to confines of ED evaluation and height of pt's stretcher as well as pt agitation.  Pt required CG assist with bed mobility while refusing to follow cues for return to bed with reverse log roll, and CG assist with functional transfers to standing position with use of hands on mattress behind her for balance.   Current level of function is close to patient's typical baseline, however pt currently requires another OT visit to ensure pt understanding of her back precautions and how they relate to her ADLs.    During this evaluation, patient was limited by back pain, generalized weakness, impaired activity tolerance, and agitation, all of which has the potential to impact patient's and/or caregivers' safety and independence during functional mobility, as well as performance for ADLs.    Patient lives with a family member who is able to provide PRN supervision and assistance.  Patient demonstrates fair  rehab potential, and should benefit from continued skilled occupational therapy services while in acute care to maximize safety, independence  and quality of life at home.  No post-acute OT services are recommended. Pt on home hospice services.   ?      If plan is discharge home, recommend the following:   A little help with walking and/or transfers;A little help with bathing/dressing/bathroom;Assistance with cooking/housework     Functional Status Assessment   Patient has had a recent decline in their functional status and demonstrates the ability to make significant improvements in function in a reasonable and predictable amount of time.     Equipment Recommendations    (TBD)     Recommendations for Other Services         Precautions/Restrictions   Precautions Precautions: Fall Precaution/Restrictions Comments: 2L O2 [place] baseline Restrictions Weight Bearing Restrictions Per Provider Order: No     Mobility Bed Mobility Overal bed mobility: Needs Assistance Bed Mobility: Rolling, Sidelying to Sit, Sit to Supine Rolling: Supervision Sidelying to sit: Contact guard assist   Sit to supine: Supervision (Did not follow cues for reverse log roll.)        Transfers                          Balance Overall balance assessment: Mild deficits observed, not formally tested                                         ADL either performed or assessed with clinical judgement   ADL Overall ADL's : Needs assistance/impaired Eating/Feeding: Independent   Grooming: Wash/dry hands;Wash/dry face;Set up;Sitting   Upper Body Bathing: Supervision/ safety Upper Body  Bathing Details (indicate cue type and reason): Pt cautioned against twisting, bending. Lower Body Bathing: Supervison/ safety (based on general assessment.)   Upper Body Dressing : Cueing for safety;Set up;Supervision/safety Upper Body Dressing Details (indicate cue type and reason): Cues to avoid twisting, bending.  Pt required Min-Mod As and cues to don correctly. Lower Body Dressing:  (Socks donned for pt this session  while pt on ED stretcher.)   Toilet Transfer: Contact guard assist Toilet Transfer Details (indicate cue type and reason): Pt stood from [employer], refused HHA and refused RW. Pt kept hands on mattress of stretcher behind her for steadying. Toileting- Clothing Manipulation and Hygiene: Contact guard assist;Supervision/safety;Cueing for back precautions       Functional mobility during ADLs: Contact guard assist;Cueing for safety;Cueing for sequencing General ADL Comments: Pt not consistently following commands for back precautions. Initiated sit to supine, ignoring cues for sequencing and twisted body to return to bed but LSO brace in place.     Vision   Vision Assessment?: No apparent visual deficits     Perception         Praxis         Pertinent Vitals/Pain Pain Assessment Pain Assessment: 0-10 Pain Score: 8  Pain Location: back Pain Descriptors / Indicators: Discomfort Pain Intervention(s): Limited activity within patient's tolerance, Monitored during session, Premedicated before session, Repositioned, Patient requesting pain meds-RN notified (Pain meds were requested.)     Extremity/Trunk Assessment Upper Extremity Assessment Upper Extremity Assessment: Overall WFL for tasks assessed   Lower Extremity Assessment Lower Extremity Assessment: Overall WFL for tasks assessed       Communication Communication Communication: No apparent difficulties   Cognition Arousal: Alert Behavior During Therapy: Flat affect               OT - Cognition Comments: Agitated but otherwise seems WFL.                 Following commands: Intact       Cueing  General Comments          Exercises     Shoulder Instructions      Home Living Family/patient expects to be discharged to:: Private residence   Available Help at Discharge: Family;Available PRN/intermittently;Friend(s) Type of Home: Mobile home Home Access: Stairs to enter Entrance Stairs-Number of  Steps: 3-4 Entrance Stairs-Rails: Right;Left;Can reach both Home Layout: One level     Bathroom Shower/Tub: Chief Strategy Officer: Standard Bathroom Accessibility: Yes   Home Equipment: Cane - single point;Rollator (4 wheels);Shower seat;Grab bars - tub/shower;Hand held shower head   Additional Comments: On 2 L O2 at home 24/7      Prior Functioning/Environment Prior Level of Function : Independent/Modified Independent             Mobility Comments: In home ambulates with rollator ADLs Comments: Ind with ADLs/selfcare, IADLs, home mgt, cooking, no longer drives; can go out in community but typically family does shopping    OT Problem List: Pain;Decreased knowledge of precautions   OT Treatment/Interventions: Self-care/ADL training;Therapeutic activities;DME and/or AE instruction;Patient/family education      OT Goals(Current goals can be found in the care plan section)   Acute Rehab OT Goals Patient Stated Goal: Pt would not respond constructively. Time For Goal Achievement: 03/24/24 Potential to Achieve Goals: Fair ADL Goals Pt Will Perform Grooming: with supervision;standing (Demonstrate compensatory stretegies for RW safety and for spinal precautions.) Pt Will Transfer to Toilet: ambulating;with supervision Pt Will Perform Toileting -  Clothing Manipulation and hygiene: sitting/lateral leans;sit to/from stand (While following back precautions) Additional ADL Goal #1: Pt will verbalize and follow 3/3 back precautions without cues during ADLs and mobility to ensure proper healing of lumbar fracture. Additional ADL Goal #2: Pt will correctly don and doff LSO brace with setup assistance only.   OT Frequency:  Min 2X/week    Co-evaluation              AM-PAC OT 6 Clicks Daily Activity     Outcome Measure Help from another person eating meals?: None Help from another person taking care of personal grooming?: A Little Help from another person  toileting, which includes using toliet, bedpan, or urinal?: A Little Help from another person bathing (including washing, rinsing, drying)?: A Little Help from another person to put on and taking off regular upper body clothing?: A Little Help from another person to put on and taking off regular lower body clothing?: A Little 6 Click Score: 19   End of Session Equipment Utilized During Treatment:  (LSO)  Activity Tolerance: Patient limited by pain Patient left: in bed;with call bell/phone within reach (stretcher)  OT Visit Diagnosis: Unsteadiness on feet (R26.81);Pain Pain - part of body:  (LBP)                Time: 8945-8885 OT Time Calculation (min): 20 min Charges:  OT General Charges $OT Visit: 1 Visit OT Evaluation $OT Eval Low Complexity: 1 Low  Koury Roddy, OT Acute Rehab Services Office: [phone] 03/10/2024   Delon Falter 03/10/2024, 1:22 PM

## 2024-03-10 NOTE — Progress Notes (Signed)
 [facility] hospitalized hospice patient visit  Tracy Park is a current [facility] hospice patient with a terminal diagnosis of chronic obstructive pulmonary disease with acute exacerbation. Patient discharged home Monday afternoon with worsening back pain which only intensified once home. She made the decision to come to the ED for further evaluation and was found to have an acute compression fracture of her lumbar region. Per Dr. Norleen Laurence with [facility] this is a related hospital admission.   Met with patient in ED room this morning. She relays the events of how she ended up back in the hospital and that she believes the actual injury may have occurred while she was getting ready to leave the hospital on Monday. We did discuss her hospice journey so far, she relays that she is ready to go but that her daughter that helps her is in denial and her other daughter is estranged and they are unable to find her. Encouraged her to continue to talk and share.   Patient is inpatient appropriate with need for testing and treatment of acute fracture.   Vital Signs: 98/76/17    121/77    98% on 2L nasal cannula I&O: not yet recorded Abnormal labs: Chlorid 94, Ca+ 8.8, Albumin 3.1, WBC 12.7 Diagnostics:   CT LUMBAR SPINE WITHOUT CONTRAST   IMPRESSION: 1. Acute L3 superior endplate fracture with 25% height loss and trace bony retropulsion, new since June 9th. 2. Similar L4 superior endplate fracture with 25% height loss. 3. Similar grade II anterolisthesis of L5 on S1. 4. Transitional lumbosacral anatomy. 5. Osteopenia.    Electronically Signed   By: Gilmore GORMAN Molt M.D.   On: 03/10/2024 00:42  IV/PRN Meds: Morphine  4mg  IVx2, Zofran  4mg  IVx1 Problem List as per [facility] Dr. Meliton Monte 7.2.25 L3 Compression Fracture  Reportedly occurred with transferring from bed to chair CT with L3 superior endplate fracture with 25% height loss and trace bony retropulsion, new since  June 9th.  Similar L4 superior endplate fracture with 25% height loss.  Similar grade II anterolisthesis of L5 on S1. Scheduled APAP, lidocaine  patches, flexeril , calcitonin nasal spray, oxycodone  prn, morphine  prn.  PT/OT Pain limited my direct exam of her back today, would try again when pain better controlled   COPD Exacerbation  Chronic Hypoxic Respiratory Failure on 2 L  Wheezing on exam, will give quick steroid taper Remains on 2 L  CXR without acute findings Continue breztri ,    Hx Lung Cancer S/p LU lobectomy    Paroxysmal Atrial Fibrillation Continue eliquis  for now (hospice may have d/c'd this outpatient?) Metoprolol    Discharge Planning: Ongoing Family Contact: None patient is her own spokesperson IDT: Updated Goals of Care: Full [name], BSN RN Hospice hospital liaison [phone]

## 2024-03-10 NOTE — Progress Notes (Signed)
  Carryover admission to the Day Admitter.  I discussed this case with the EDP, Dr. Albertina.  Per these discussions:   This is a 68 year old female with chronic hypoxic respiratory failure on 2 L continuous nasal cannula, severe COPD, lung cancer, existing L4 compression fracture, who is now being admitted for pain control as well as PT/OT in the setting of acute L3 compression fracture after presenting with acute worsening of her low back discomfort when attempting to sit down.  Not associate with any recent trauma.  No report of any acute focal weakness, numbness, paresthesias, nor any new urinary retention or fecal incontinence.  CT imaging reveals acute L3 compression fracture.  She has received doses of IV morphine  and Percocet in the ED, but continues reports suboptimal pain control relating to her low back discomfort.  She is also received TLSO brace.  EDP has also ordered Lidoderm  patch.  I have placed an order for  Observation to med/tele for further evaluation management of the above  I have placed some additional preliminary admit orders via the adult multi-morbid admission order set. I have also ordered prn Percocet for moderate pain, morphine  4 mg IV every 3 hours as needed for severe pain, NSAIDs from a tree, prn IV Zofran .  I have also ordered fall precautions, PT/OT consults for the morning, and morning labs in the form of CMP, CBC, magnesium  level.  Regarding her history of severe COPD chronic hypoxic respiratory failure, I have resumed her home Breztri  inhaler and have also ordered prn albuterol  nebulizer.  Of note, I have ordered her code status as DNR/DNI based upon CODE STATUS documentation from last several hospitalizations as well as her active DNR form on file.    Eva Pore, DO Hospitalist

## 2024-03-10 NOTE — ED Provider Notes (Addendum)
  Physical Exam  BP 111/73   Pulse 72   Temp 98.7 F (37.1 C) (Oral)   Resp 15   SpO2 98%   Physical Exam Vitals and nursing note reviewed.  Constitutional:      General: She is not in acute distress.    Appearance: She is well-developed.  HENT:     Head: Normocephalic and atraumatic.  Eyes:     Conjunctiva/sclera: Conjunctivae normal.  Cardiovascular:     Rate and Rhythm: Normal rate and regular rhythm.     Heart sounds: No murmur heard. Pulmonary:     Effort: Pulmonary effort is normal. No respiratory distress.     Breath sounds: Normal breath sounds.  Abdominal:     Palpations: Abdomen is soft.     Tenderness: There is abdominal tenderness.  Musculoskeletal:        General: No swelling.     Cervical back: Neck supple.  Skin:    General: Skin is warm and dry.     Capillary Refill: Capillary refill takes less than 2 seconds.  Neurological:     Mental Status: She is alert.  Psychiatric:        Mood and Affect: Mood normal.     Procedures  Procedures  ED Course / MDM    Medical Decision Making Amount and/or Complexity of Data Reviewed Labs: ordered. Radiology: ordered.  Risk Prescription drug management.   Patient received in handoff.  Back pain pending CT L-spine.  CT L-spine showing new L3 superior endplate fracture.  Pain out of control here in the emergency room despite multiple doses of morphine  and patient require hospital admission for pain control in the setting of a new fracture.  Pt admitted      Albertina Dixon, MD 03/10/24 [id number]    Albertina Dixon, MD 03/10/24 [phone]

## 2024-03-11 ENCOUNTER — Inpatient Hospital Stay (HOSPITAL_COMMUNITY)

## 2024-03-11 DIAGNOSIS — S32030A Wedge compression fracture of third lumbar vertebra, initial encounter for closed fracture: Secondary | ICD-10-CM | POA: Diagnosis not present

## 2024-03-11 LAB — COMPREHENSIVE METABOLIC PANEL WITH GFR
ALT: 31 U/L (ref 0–44)
AST: 17 U/L (ref 15–41)
Albumin: 2.8 g/dL — ABNORMAL LOW (ref 3.5–5.0)
Alkaline Phosphatase: 77 U/L (ref 38–126)
Anion gap: 7 (ref 5–15)
BUN: 22 mg/dL (ref 8–23)
CO2: 36 mmol/L — ABNORMAL HIGH (ref 22–32)
Calcium: 8.3 mg/dL — ABNORMAL LOW (ref 8.9–10.3)
Chloride: 98 mmol/L (ref 98–111)
Creatinine, Ser: 0.5 mg/dL (ref 0.44–1.00)
GFR, Estimated: >60 mL/min (ref >60)
Glucose, Bld: 116 mg/dL — ABNORMAL HIGH (ref 70–99)
Potassium: 3.5 mmol/L (ref 3.5–5.1)
Sodium: 141 mmol/L (ref 135–145)
Total Bilirubin: 0.4 mg/dL (ref 0.0–1.2)
Total Protein: 5.5 g/dL — ABNORMAL LOW (ref 6.5–8.1)

## 2024-03-11 LAB — CBC
HCT: 36.8 % (ref 36.0–46.0)
Hemoglobin: 11.4 g/dL — ABNORMAL LOW (ref 12.0–15.0)
MCH: 29.4 pg (ref 26.0–34.0)
MCHC: 31 g/dL (ref 30.0–36.0)
MCV: 94.8 fL (ref 80.0–100.0)
Platelets: 263 10*3/uL (ref 150–400)
RBC: 3.88 MIL/uL (ref 3.87–5.11)
RDW: 14.3 % (ref 11.5–15.5)
WBC: 10.8 10*3/uL — ABNORMAL HIGH (ref 4.0–10.5)
nRBC: 0 % (ref 0.0–0.2)

## 2024-03-11 MED ORDER — ENSURE PLUS HIGH PROTEIN PO LIQD
237.0000 mL | Freq: Two times a day (BID) | ORAL | Status: DC
  Administered 2024-03-11 – 2024-03-22 (×15): 237 mL via ORAL

## 2024-03-11 NOTE — Plan of Care (Signed)
 Alert and oriented.  Mobility limited due to pain from fractures.  Medicated for pain and muscle spasms, see MAR for details.  Plan for MRI today, denies claustrophobia.   Problem: Education: Goal: Knowledge of General Education information will improve Description: Including pain rating scale, medication(s)/side effects and non-pharmacologic comfort measures Outcome: Progressing   Problem: Health Behavior/Discharge Planning: Goal: Ability to manage health-related needs will improve Outcome: Progressing   Problem: Clinical Measurements: Goal: Ability to maintain clinical measurements within normal limits will improve Outcome: Progressing Goal: Will remain free from infection Outcome: Progressing Goal: Diagnostic test results will improve Outcome: Progressing

## 2024-03-11 NOTE — Progress Notes (Signed)
 [facility]25 [facility] hospitalized hospice patient visit  Tracy Park is a current [facility] hospice patient with a terminal diagnosis of chronic obstructive pulmonary disease with acute exacerbation. Patient discharged home Monday afternoon with worsening back pain which only intensified once home. She made the decision to come to the ED for further evaluation and was found to have an acute compression fracture of her lumbar region. Per Dr. Norleen Laurence with [facility] this is a related hospital admission.   Visited patient at bedside, patient resting with eyes closed but is easily arousable. She reports her pain is being managed with medications but becomes pretty severe before she gets her medicine again. Discussed possibility of transition over to [facility] to work to get pain at a manageable level. Patient verbalizes that she will think about this but also wants to see therapy today so that she can assess how she will do with movement and the brace she is wearing.   Patient is inpatient appropriate with treatment of acute lumbar fracture.  Vital Signs: 98/69/17    112/80    spO2 96% on 2L nasal cannula I&O: 1080/900 Abnormal labs: Ca + 8.3, Albumin 2.8, WBC 10.8, Hgb 11.4 Diagnostics:  none new IV/PRN Meds: Flexeril  10mg  BID PRN x1, Oxycodone  10mg  q4H prn x2 Problem List as per [facility] Dr. Meliton Monte 7.2.25 L3 Compression Fracture  Reportedly occurred with transferring from bed to chair CT with L3 superior endplate fracture with 25% height loss and trace bony retropulsion, new since June 9th.  Similar L4 superior endplate fracture with 25% height loss.  Similar grade II anterolisthesis of L5 on S1. Scheduled APAP, lidocaine  patches, flexeril , calcitonin nasal spray, oxycodone  prn, morphine  prn.  PT/OT Pain limited my direct exam of her back today, would try again when pain better controlled   COPD Exacerbation  Chronic Hypoxic Respiratory Failure on 2 L  Wheezing on  exam, will give quick steroid taper Remains on 2 L  CXR without acute findings Continue breztri ,    Hx Lung Cancer S/p LU lobectomy    Paroxysmal Atrial Fibrillation Continue eliquis  for now (hospice may have d/c'd this outpatient?) Metoprolol    Discharge Planning: Ongoing, initiated discussion about transfer to [facility] for pain management Family Contact: None patient is her own spokesperson IDT: Updated Goals of Care: Full [name], BSN RN Hospice hospital liaison [phone]

## 2024-03-11 NOTE — Progress Notes (Signed)
 PROGRESS NOTE    NYASIAH Park  FMW:[id number] DOB: 1956/01/20 DOA: 03/09/2024 PCP: Corlis Longs, FNP   Brief Narrative: Tracy Park is a 68 y.o. female with a history of COPD, adenocarcinoma of the lung status post left upper lobe lobectomy, hypothyroidism, anxiety, PTSD, atrial fibrillation.  Patient presented secondary to back pain was found to have evidence of a new L3 compression fracture.  Patient started on analgesics, calcitonin, back brace for management.  Patient with continued intractable pain and is being considered for possible kyphoplasty.   Assessment and Plan:  L3 compression fracture Per history, occurred during transferring from bed to chair.  CT of the spine was significant for new L3 superior endplate fracture with 25% height loss.  Patient started on pain management schedule and given calcitonin nasal spray.  Back brace provided. - Continue scheduled steroids, lidocaine  patch, Flexeril , oxycodone  - IR consultation for consideration of kyphoplasty secondary to intractable pain per patient; IR requesting MRI prior to consideration of kyphoplasty  COPD exacerbation Mild.  Patient was given steroids on admission.  No significant symptoms this morning. - Continue steroid taper - Continue Breztri , DuoNeb  Chronic respiratory failure with hypoxia Patient is on 2 L/min of oxygen  which is her baseline.  Primary hypertension - Continue losartan , metoprolol  succinate  Hypothyroidism - Continue Synthroid   History of lung cancer Patient has a history of left upper lobe lobectomy.  Patient has been referred to hospice.  Previously DNR, now full code.  Paroxysmal atrial fibrillation - Continue metoprolol  and Eliquis   Hyperlipidemia - Continue Crestor   Depression - Continue Zoloft    DVT prophylaxis: Eliquis  Code Status:   Code Status: Full Code Family Communication: None at bedside Disposition Plan: Discharge home pending ability to control pain well  enough for patient to tolerate mobility   Consultants:  Interventional radiology  Procedures:  None  Antimicrobials: None   Subjective: Patient reports ongoing back pain.  She reports significant pain while trying to ambulate.  Patient is confident that she will not be able to manage at home with her current level of function secondary to her pain.  Objective: BP 114/84 (BP Location: Right Arm)   Pulse 77   Temp 98.1 F (36.7 C)   Resp 17   Ht 5' 3 (1.6 m)   Wt 49.3 kg   SpO2 92%   BMI 19.25 kg/m   Examination:  General exam: Appears calm and comfortable Respiratory system: Clear to auscultation. Respiratory effort normal. Cardiovascular system: S1 & S2 heard, RRR. No murmurs. Gastrointestinal system: Abdomen is nondistended, soft and nontender. Normal bowel sounds heard. Central nervous system: Alert and oriented. No focal neurological deficits. Musculoskeletal: No edema. No calf tenderness Psychiatry: Judgement and insight appear normal. Mood & affect appropriate.    Data Reviewed: I have personally reviewed following labs and imaging studies  CBC Lab Results  Component Value Date   WBC 10.8 (H) 03/11/2024   RBC 3.88 03/11/2024   HGB 11.4 (L) 03/11/2024   HCT 36.8 03/11/2024   MCV 94.8 03/11/2024   MCH 29.4 03/11/2024   PLT 263 03/11/2024   MCHC 31.0 03/11/2024   RDW 14.3 03/11/2024   LYMPHSABS 2.3 03/10/2024   MONOABS 1.1 (H) 03/10/2024   EOSABS 0.0 03/10/2024   BASOSABS 0.0 03/10/2024     Last metabolic panel Lab Results  Component Value Date   NA 141 03/11/2024   K 3.5 03/11/2024   CL 98 03/11/2024   CO2 36 (H) 03/11/2024   BUN 22 03/11/2024  CREATININE 0.50 03/11/2024   GLUCOSE 116 (H) 03/11/2024   GFRNONAA >60 03/11/2024   GFRAA >60 04/12/2020   CALCIUM  8.3 (L) 03/11/2024   PHOS 2.1 (L) 12/30/2023   PROT 5.5 (L) 03/11/2024   ALBUMIN 2.8 (L) 03/11/2024   BILITOT 0.4 03/11/2024   ALKPHOS 77 03/11/2024   AST 17 03/11/2024   ALT 31  03/11/2024   ANIONGAP 7 03/11/2024    GFR: Estimated Creatinine Clearance: 54.6 mL/min (by C-G formula based on SCr of 0.5 mg/dL).  Recent Results (from the past 240 hours)  Resp panel by RT-PCR (RSV, Flu A&B, Covid) Anterior Nasal Swab     Status: None   Collection Time: 03/02/24  8:43 PM   Specimen: Anterior Nasal Swab  Result Value Ref Range Status   SARS Coronavirus 2 by RT PCR NEGATIVE NEGATIVE Final    Comment: (NOTE) SARS-CoV-2 target nucleic acids are NOT DETECTED.  The SARS-CoV-2 RNA is generally detectable in upper respiratory specimens during the acute phase of infection. The lowest concentration of SARS-CoV-2 viral copies this assay can detect is 138 copies/mL. A negative result does not preclude SARS-Cov-2 infection and should not be used as the sole basis for treatment or other patient management decisions. A negative result may occur with  improper specimen collection/handling, submission of specimen other than nasopharyngeal swab, presence of viral mutation(s) within the areas targeted by this assay, and inadequate number of viral copies(<138 copies/mL). A negative result must be combined with clinical observations, patient history, and epidemiological information. The expected result is Negative.  Fact Sheet for Patients:  BloggerCourse.com  Fact Sheet for Healthcare Providers:  SeriousBroker.it  This test is no t yet approved or cleared by the [place]  FDA and  has been authorized for detection and/or diagnosis of SARS-CoV-2 by FDA under an Emergency Use Authorization (EUA). This EUA will remain  in effect (meaning this test can be used) for the duration of the COVID-19 declaration under Section 564(b)(1) of the Act, 21 U.S.C.section 360bbb-3(b)(1), unless the authorization is terminated  or revoked sooner.       Influenza A by PCR NEGATIVE NEGATIVE Final   Influenza B by PCR NEGATIVE NEGATIVE Final     Comment: (NOTE) The Xpert Xpress SARS-CoV-2/FLU/RSV plus assay is intended as an aid in the diagnosis of influenza from Nasopharyngeal swab specimens and should not be used as a sole basis for treatment. Nasal washings and aspirates are unacceptable for Xpert Xpress SARS-CoV-2/FLU/RSV testing.  Fact Sheet for Patients: BloggerCourse.com  Fact Sheet for Healthcare Providers: SeriousBroker.it  This test is not yet approved or cleared by the [place]  FDA and has been authorized for detection and/or diagnosis of SARS-CoV-2 by FDA under an Emergency Use Authorization (EUA). This EUA will remain in effect (meaning this test can be used) for the duration of the COVID-19 declaration under Section 564(b)(1) of the Act, 21 U.S.C. section 360bbb-3(b)(1), unless the authorization is terminated or revoked.     Resp Syncytial Virus by PCR NEGATIVE NEGATIVE Final    Comment: (NOTE) Fact Sheet for Patients: BloggerCourse.com  Fact Sheet for Healthcare Providers: SeriousBroker.it  This test is not yet approved or cleared by the [place]  FDA and has been authorized for detection and/or diagnosis of SARS-CoV-2 by FDA under an Emergency Use Authorization (EUA). This EUA will remain in effect (meaning this test can be used) for the duration of the COVID-19 declaration under Section 564(b)(1) of the Act, 21 U.S.C. section 360bbb-3(b)(1), unless the authorization is terminated or revoked.  Performed at [facility], [address] [place], [place] [address]   Expectorated Sputum Assessment w Gram Stain, Rflx to Resp Cult     Status: None   Collection Time: 03/03/24  2:42 AM   Specimen: Sputum  Result Value Ref Range Status   Specimen Description SPUTUM  Final   Special Requests NONE  Final   Sputum evaluation   Final    THIS SPECIMEN IS ACCEPTABLE FOR SPUTUM  CULTURE Performed at [facility], [address] [place], [place] [address]    Report Status 03/03/2024 FINAL  Final  Culture, Respiratory w Gram Stain     Status: None   Collection Time: 03/03/24  2:42 AM   Specimen: SPU  Result Value Ref Range Status   Specimen Description   Final    SPUTUM Performed at [facility], [address], [place], [place] [address]    Special Requests   Final    NONE Reflexed from [id number] Performed at [facility], [address] [place], [place] [address]    Gram Stain   Final    FEW WBC PRESENT,BOTH PMN AND MONONUCLEAR ABUNDANT SQUAMOUS EPITHELIAL CELLS PRESENT MODERATE GRAM POSITIVE RODS MODERATE GRAM POSITIVE COCCI IN CLUSTERS FEW GRAM NEGATIVE RODS Performed at [facility], [address], [place], [place] [address]    Culture FEW PSEUDOMONAS AERUGINOSA  Final   Report Status 03/06/2024 FINAL  Final   Organism ID, Bacteria PSEUDOMONAS AERUGINOSA  Final      Susceptibility   Pseudomonas aeruginosa - MIC*    CEFTAZIDIME <=1 SENSITIVE Sensitive     CIPROFLOXACIN <=0.25 SENSITIVE Sensitive     GENTAMICIN 8 INTERMEDIATE Intermediate     IMIPENEM 1 SENSITIVE Sensitive     PIP/TAZO <=4 SENSITIVE Sensitive ug/mL    CEFEPIME  4 SENSITIVE Sensitive     * FEW PSEUDOMONAS AERUGINOSA      Radiology Studies: CT Lumbar Spine Wo Contrast Result Date: 03/10/2024 CLINICAL DATA:  Low back pain, increased fracture risk. EXAM: CT LUMBAR SPINE WITHOUT CONTRAST TECHNIQUE: Multidetector CT imaging of the lumbar spine was performed without intravenous contrast administration. Multiplanar CT image reconstructions were also generated. RADIATION DOSE REDUCTION: This exam was performed according to the departmental dose-optimization program which includes automated exposure control, adjustment of the mA and/or kV according to patient size and/or use of iterative reconstruction technique. COMPARISON:  None  Available. FINDINGS: Segmentation: Transitional lumbosacral anatomy. Two be consistent with the prior numbering system, there is a transitional vertebral body that is considered to be S1. Alignment: Grade II anterolisthesis of L5 on S1. Vertebrae: Similar L4 superior endplate fracture with approximately 25% height loss. Acute L3 superior endplate fracture with 25% height loss and trace bony retropulsion, new since June 9th. Osteopenia. Paraspinal and other soft tissues: Negative. Disc levels: Similar lower lumbar facet arthropathy. Disc bulge at L5-S1 with at least mild canal stenosis. IMPRESSION: 1. Acute L3 superior endplate fracture with 25% height loss and trace bony retropulsion, new since June 9th. 2. Similar L4 superior endplate fracture with 25% height loss. 3. Similar grade II anterolisthesis of L5 on S1. 4. Transitional lumbosacral anatomy. 5. Osteopenia. Electronically Signed   By: Gilmore GORMAN Molt M.D.   On: 03/10/2024 00:42   DG Chest Portable 1 View Result Date: 03/09/2024 CLINICAL DATA:  Shortness of breath.  History of lung cancer. EXAM: PORTABLE CHEST 1 VIEW COMPARISON:  03/06/2024 FINDINGS: Postsurgical volume loss in the left hemithorax. Stable left apical  pleuroparenchymal opacity. The heart is normal in size. Emphysema and bronchial thickening, unchanged from prior. Unchanged blunting of left costophrenic angle. No large effusion. No pneumothorax. On limited assessment, no acute osseous finding. IMPRESSION: 1. No acute findings. 2. Postsurgical volume loss in the left hemithorax. Stable left apical pleuroparenchymal opacity. 3. Emphysema and bronchial thickening. Electronically Signed   By: Andrea Gasman M.D.   On: 03/09/2024 20:08      LOS: 1 day    Elgin Lam, MD [facility] 03/11/2024, 4:52 PM   If 7PM-7AM, please contact night-coverage [email or web address]

## 2024-03-11 NOTE — Hospital Course (Addendum)
 Tracy Park is a 68 y.o. female with a history of COPD, adenocarcinoma of the lung status post left upper lobe lobectomy, hypothyroidism, anxiety, PTSD, atrial fibrillation.  Patient presented secondary to back pain was found to have evidence of a new L3 compression fracture.  Patient started on analgesics, calcitonin, back brace for management.  Patient with continued intractable pain and now status post kyphoplasty 03/17/2024.  She needs to continue with physical and Occupational Therapy and pain control.  She wants to go home with home hospice once stable. She was discussed today and she agreed to go home on home hospice.  She feels better her pain has improved.  She was able to work with physical therapy.  She has oxygen  2 L at home.  She will be discharged home on home hospice.

## 2024-03-11 NOTE — Progress Notes (Signed)
Arrived back from MRI at this time. 

## 2024-03-11 NOTE — Progress Notes (Signed)
 Transported off unit by Patient Transporter to MRI.  Transport method: Bed Accompanied by: Patient Transporter staff member.

## 2024-03-12 DIAGNOSIS — S32030A Wedge compression fracture of third lumbar vertebra, initial encounter for closed fracture: Secondary | ICD-10-CM | POA: Diagnosis not present

## 2024-03-12 NOTE — Progress Notes (Signed)
 Physical Therapy Treatment Patient Details Name: Tracy Park MRN: [id number] DOB: July 13, 1956 Today's Date: 03/12/2024   History of Present Illness 68 year old female re-admitted to [facility] on 03/09/24 after beginning home hospice care for pain control as well as PT/OT in the setting of acute L3 compression fracture after presenting with acute worsening of her low back discomfort when attempting to sit down.  Previous recent admission on 03/02/24 for Acute COPD exacerbation.  Past medical history of hypertension, depression, anxiety, paroxysmal A-fib on Eliquis , lung cancer status post lumpectomy, severe COPD with multiple admissions now enrolled with outpatient hospice, chronic hypoxic/hypercarbic respiratory failure.    PT Comments  Pt a little reluctant to mobilize however agreeable with encouragement.  Pt educated on donning back brace and assisted with ambulating in hallway short distance which was limited by pain and dyspnea (although on baseline 2L O2 [place] and SPO2 91 and 93% upon checking during activity).  Pt also reliant on bil UE support so provided bil HHA (since she declined RW).  Per evaluation, pt has rollator at home so will attempt with rollator next visit.    If plan is discharge home, recommend the following: A little help with walking and/or transfers;A little help with bathing/dressing/bathroom;Assistance with cooking/housework;Assist for transportation;Help with stairs or ramp for entrance   Can travel by private vehicle        Equipment Recommendations  None recommended by PT    Recommendations for Other Services       Precautions / Restrictions Precautions Precautions: Fall Precaution/Restrictions Comments: 2L O2 [place] baseline Required Braces or Orthoses: Spinal Brace Spinal Brace: Lumbar corset;Applied in sitting position     Mobility  Bed Mobility Overal bed mobility: Needs Assistance Bed Mobility: Rolling, Sidelying to Sit Rolling: Supervision, Used rails, +2 for  safety/equipment         General bed mobility comments: did not follow instructions for log roll but HOB elevated and no assist required; pt assisted with donning LSO at edge of bed - requiring cues to don    Transfers Overall transfer level: Needs assistance Equipment used: 2 person hand held assist Transfers: Sit to/from Stand Sit to Stand: Min assist           General transfer comment: required UE self assist, min assist for stabilizing; pt tightened back brace in standing with cues    Ambulation/Gait Ambulation/Gait assistance: Min assist Gait Distance (Feet): 30 Feet Assistive device: 2 person hand held assist Gait Pattern/deviations: Step-through pattern, Decreased stride length Gait velocity: decr     General Gait Details: declined using RW but needing bil UE support so provided bil HHA; maintained 2L O2 [place] (baseline) and SpO2 91% and 93%; pt reports 3/4 dyspnea and pain limiting distance   Stairs             Wheelchair Mobility     Tilt Bed    Modified Rankin (Stroke Patients Only)       Balance                                            Communication Communication Communication: No apparent difficulties  Cognition Arousal: Alert Behavior During Therapy: Flat affect   PT - Cognitive impairments: No apparent impairments                         Following commands: Intact  Cueing    Exercises      General Comments        Pertinent Vitals/Pain      Home Living                          Prior Function            PT Goals (current goals can now be found in the care plan section) Progress towards PT goals: Progressing toward goals    Frequency    Min 3X/week      PT Plan      Co-evaluation              AM-PAC PT 6 Clicks Mobility   Outcome Measure  Help needed turning from your back to your side while in a flat bed without using bedrails?: A Little Help needed  moving from lying on your back to sitting on the side of a flat bed without using bedrails?: A Little Help needed moving to and from a bed to a chair (including a wheelchair)?: A Little Help needed standing up from a chair using your arms (e.g., wheelchair or bedside chair)?: A Little Help needed to walk in hospital room?: A Lot Help needed climbing 3-5 steps with a railing? : A Lot 6 Click Score: 16    End of Session Equipment Utilized During Treatment: Gait belt;Oxygen  Activity Tolerance: Patient limited by pain Patient left: in chair;with chair alarm set;with call bell/phone within reach Nurse Communication: Mobility status PT Visit Diagnosis: Difficulty in walking, not elsewhere classified (R26.2)     Time: 8494-8474 PT Time Calculation (min) (ACUTE ONLY): 20 min  Charges:    $Gait Training: 8-22 mins PT General Charges $$ ACUTE PT VISIT: 1 Visit                    Tari KLEIN, DPT Physical Therapist Acute Rehabilitation Services Office: [phone]    Tari CROME [place] 03/12/2024, 4:44 PM

## 2024-03-12 NOTE — Progress Notes (Signed)
 [facility] hospitalized hospice patient visit  Tracy Park is a current [facility] hospice patient with a terminal diagnosis of chronic obstructive pulmonary disease with acute exacerbation. Patient discharged home Monday afternoon with worsening back pain which only intensified once home. She made the decision to come to the ED for further evaluation and was found to have an acute compression fracture of her lumbar region. Per Dr. Norleen Laurence with [facility] this is a related hospital admission.   Visited patient at bedside, she is awake and alert currently resting in bed writing in her journal. Patient reports that therapy and po medications are not helping to stabilize the intense back pain she is having and they are currently having someone consult to discuss other options which she reports she is not sure of. Exchanged report with CM and reviewed EMR, patient currently awaiting a consult with IR for possible kyphoplasty.   She remains inpatient appropriate as they attempt to diagnose and determine treatment options for acute fracture.   Vital Signs: 98/77/16   126/80    98% on 2L nasal cannula I&O: 300/700 Abnormal labs: None new Diagnostics:  none new IV/PRN Meds: Flexeril  10mg  BID PRN x1, Oxycodone  10mg  q4H prn x2 Problem List as per [facility] Dr. Elgin Lam 7.4.25 L3/L4 compression fracture Per history, occurred during transferring from bed to chair.  CT of the spine was significant for new L3 superior endplate fracture with 25% height loss.  Patient started on pain management schedule and given calcitonin nasal spray.  Back brace provided. MRI confirms L3 and L4 acute/subacute and acute on chronic compression fractures, respectively. - Continue scheduled steroids, lidocaine  patch, Flexeril , oxycodone  - IR consultation for consideration of kyphoplasty secondary to intractable pain per patient   COPD exacerbation Mild.  Patient was given steroids on admission.  No  significant symptoms this morning. - Continue steroid taper - Continue Breztri , DuoNeb   Discharge Planning: Ongoing, currently patient awaiting IR consult Family Contact: None patient is her own spokesperson IDT: Updated Goals of Care: Full [name], BSN RN Hospice hospital liaison [phone]

## 2024-03-12 NOTE — Progress Notes (Signed)
 PROGRESS NOTE    Tracy Park  FMW:[id number] DOB: 1956-07-18 DOA: 03/09/2024 PCP: Corlis Longs, FNP   Brief Narrative: Tracy Park is a 68 y.o. female with a history of COPD, adenocarcinoma of the lung status post left upper lobe lobectomy, hypothyroidism, anxiety, PTSD, atrial fibrillation.  Patient presented secondary to back pain was found to have evidence of a new L3 compression fracture.  Patient started on analgesics, calcitonin, back brace for management.  Patient with continued intractable pain and is being considered for possible kyphoplasty.   Assessment and Plan:  L3/L4 compression fracture Per history, occurred during transferring from bed to chair.  CT of the spine was significant for new L3 superior endplate fracture with 25% height loss.  Patient started on pain management schedule and given calcitonin nasal spray.  Back brace provided. MRI confirms L3 and L4 acute/subacute and acute on chronic compression fractures, respectively. - Continue scheduled steroids, lidocaine  patch, Flexeril , oxycodone  - IR consultation for consideration of kyphoplasty secondary to intractable pain per patient  COPD exacerbation Mild.  Patient was given steroids on admission.  No significant symptoms this morning. - Continue steroid taper - Continue Breztri , DuoNeb  Chronic respiratory failure with hypoxia Patient is on 2 L/min of oxygen  which is her baseline.  Primary hypertension - Continue losartan , metoprolol  succinate  Hypothyroidism - Continue Synthroid   History of lung cancer Patient has a history of left upper lobe lobectomy.  Patient has been referred to hospice.  Previously DNR, now full code.  Paroxysmal atrial fibrillation - Continue metoprolol  and Eliquis   Hyperlipidemia - Continue Crestor   Depression - Continue Zoloft    DVT prophylaxis: Eliquis  Code Status:   Code Status: Full Code Family Communication: None at bedside Disposition Plan: Discharge home  pending ability to control pain well enough for patient to tolerate mobility   Consultants:  Interventional radiology  Procedures:  None  Antimicrobials: None   Subjective: Continued back pain with movement. Patient unable to function and perform ADLs secondary to persistent back pain.  Objective: BP 126/80 (BP Location: Right Arm)   Pulse 77   Temp 98 F (36.7 C) (Oral)   Resp 16   Ht 5' 3 (1.6 m)   Wt 51.9 kg   SpO2 100%   BMI 20.27 kg/m   Examination:  General exam: Appears calm and comfortable Respiratory system: Clear to auscultation. Respiratory effort normal. Cardiovascular system: S1 & S2 heard, RRR. No murmurs. Gastrointestinal system: Abdomen is nondistended, soft and nontender. Normal bowel sounds heard. Central nervous system: Alert and oriented. No focal neurological deficits. Musculoskeletal: No edema. No calf tenderness Psychiatry: Judgement and insight appear normal. Mood & affect appropriate.    Data Reviewed: I have personally reviewed following labs and imaging studies  CBC Lab Results  Component Value Date   WBC 10.8 (H) 03/11/2024   RBC 3.88 03/11/2024   HGB 11.4 (L) 03/11/2024   HCT 36.8 03/11/2024   MCV 94.8 03/11/2024   MCH 29.4 03/11/2024   PLT 263 03/11/2024   MCHC 31.0 03/11/2024   RDW 14.3 03/11/2024   LYMPHSABS 2.3 03/10/2024   MONOABS 1.1 (H) 03/10/2024   EOSABS 0.0 03/10/2024   BASOSABS 0.0 03/10/2024     Last metabolic panel Lab Results  Component Value Date   NA 141 03/11/2024   K 3.5 03/11/2024   CL 98 03/11/2024   CO2 36 (H) 03/11/2024   BUN 22 03/11/2024   CREATININE 0.50 03/11/2024   GLUCOSE 116 (H) 03/11/2024   GFRNONAA >60  03/11/2024   GFRAA >60 04/12/2020   CALCIUM  8.3 (L) 03/11/2024   PHOS 2.1 (L) 12/30/2023   PROT 5.5 (L) 03/11/2024   ALBUMIN 2.8 (L) 03/11/2024   BILITOT 0.4 03/11/2024   ALKPHOS 77 03/11/2024   AST 17 03/11/2024   ALT 31 03/11/2024   ANIONGAP 7 03/11/2024    GFR: Estimated  Creatinine Clearance: 57.4 mL/min (by C-G formula based on SCr of 0.5 mg/dL).  Recent Results (from the past 240 hours)  Resp panel by RT-PCR (RSV, Flu A&B, Covid) Anterior Nasal Swab     Status: None   Collection Time: 03/02/24  8:43 PM   Specimen: Anterior Nasal Swab  Result Value Ref Range Status   SARS Coronavirus 2 by RT PCR NEGATIVE NEGATIVE Final    Comment: (NOTE) SARS-CoV-2 target nucleic acids are NOT DETECTED.  The SARS-CoV-2 RNA is generally detectable in upper respiratory specimens during the acute phase of infection. The lowest concentration of SARS-CoV-2 viral copies this assay can detect is 138 copies/mL. A negative result does not preclude SARS-Cov-2 infection and should not be used as the sole basis for treatment or other patient management decisions. A negative result may occur with  improper specimen collection/handling, submission of specimen other than nasopharyngeal swab, presence of viral mutation(s) within the areas targeted by this assay, and inadequate number of viral copies(<138 copies/mL). A negative result must be combined with clinical observations, patient history, and epidemiological information. The expected result is Negative.  Fact Sheet for Patients:  BloggerCourse.com  Fact Sheet for Healthcare Providers:  SeriousBroker.it  This test is no t yet approved or cleared by the [place]  FDA and  has been authorized for detection and/or diagnosis of SARS-CoV-2 by FDA under an Emergency Use Authorization (EUA). This EUA will remain  in effect (meaning this test can be used) for the duration of the COVID-19 declaration under Section 564(b)(1) of the Act, 21 U.S.C.section 360bbb-3(b)(1), unless the authorization is terminated  or revoked sooner.       Influenza A by PCR NEGATIVE NEGATIVE Final   Influenza B by PCR NEGATIVE NEGATIVE Final    Comment: (NOTE) The Xpert Xpress SARS-CoV-2/FLU/RSV  plus assay is intended as an aid in the diagnosis of influenza from Nasopharyngeal swab specimens and should not be used as a sole basis for treatment. Nasal washings and aspirates are unacceptable for Xpert Xpress SARS-CoV-2/FLU/RSV testing.  Fact Sheet for Patients: BloggerCourse.com  Fact Sheet for Healthcare Providers: SeriousBroker.it  This test is not yet approved or cleared by the [place]  FDA and has been authorized for detection and/or diagnosis of SARS-CoV-2 by FDA under an Emergency Use Authorization (EUA). This EUA will remain in effect (meaning this test can be used) for the duration of the COVID-19 declaration under Section 564(b)(1) of the Act, 21 U.S.C. section 360bbb-3(b)(1), unless the authorization is terminated or revoked.     Resp Syncytial Virus by PCR NEGATIVE NEGATIVE Final    Comment: (NOTE) Fact Sheet for Patients: BloggerCourse.com  Fact Sheet for Healthcare Providers: SeriousBroker.it  This test is not yet approved or cleared by the [place]  FDA and has been authorized for detection and/or diagnosis of SARS-CoV-2 by FDA under an Emergency Use Authorization (EUA). This EUA will remain in effect (meaning this test can be used) for the duration of the COVID-19 declaration under Section 564(b)(1) of the Act, 21 U.S.C. section 360bbb-3(b)(1), unless the authorization is terminated or revoked.  Performed at [facility], [address] [place], [place]  [address]   Expectorated Sputum Assessment w Gram Stain, Rflx to Resp Cult     Status: None   Collection Time: 03/03/24  2:42 AM   Specimen: Sputum  Result Value Ref Range Status   Specimen Description SPUTUM  Final   Special Requests NONE  Final   Sputum evaluation   Final    THIS SPECIMEN IS ACCEPTABLE FOR SPUTUM CULTURE Performed at [facility], [address] [place], [place] [address]    Report Status 03/03/2024 FINAL  Final  Culture, Respiratory w Gram Stain     Status: None   Collection Time: 03/03/24  2:42 AM   Specimen: SPU  Result Value Ref Range Status   Specimen Description   Final    SPUTUM Performed at [facility], [address], [place], [place] [address]    Special Requests   Final    NONE Reflexed from [id number] Performed at [facility], [address], [place], [place] [address]    Gram Stain   Final    FEW WBC PRESENT,BOTH PMN AND MONONUCLEAR ABUNDANT SQUAMOUS EPITHELIAL CELLS PRESENT MODERATE GRAM POSITIVE RODS MODERATE GRAM POSITIVE COCCI IN CLUSTERS FEW GRAM NEGATIVE RODS Performed at [facility], [address] [place], [place] [address]    Culture FEW PSEUDOMONAS AERUGINOSA  Final   Report Status 03/06/2024 FINAL  Final   Organism ID, Bacteria PSEUDOMONAS AERUGINOSA  Final      Susceptibility   Pseudomonas aeruginosa - MIC*    CEFTAZIDIME <=1 SENSITIVE Sensitive     CIPROFLOXACIN <=0.25 SENSITIVE Sensitive     GENTAMICIN 8 INTERMEDIATE Intermediate     IMIPENEM 1 SENSITIVE Sensitive     PIP/TAZO <=4 SENSITIVE Sensitive ug/mL    CEFEPIME  4 SENSITIVE Sensitive     * FEW PSEUDOMONAS AERUGINOSA      Radiology Studies: MR LUMBAR SPINE WO CONTRAST Result Date: 03/11/2024 CLINICAL DATA:  Lumbar compression fracture. EXAM: MRI LUMBAR SPINE WITHOUT CONTRAST TECHNIQUE: Multiplanar, multisequence MR imaging of the lumbar spine was performed. No intravenous contrast was administered. COMPARISON:  CT lumbar spine dated March 09, 2024. FINDINGS: Segmentation: There is transitional anatomy present. In order to maintain consistency with the prior reporting, the last rib-bearing vertebral body will be considered L1 and the first sacralized vertebral body is considered S1. Alignment:  Grade 2 anterolisthesis at L5-S1. Vertebrae: There is downward bowing of the superior  endplate of L4, which has lost about 30% of its height centrally. There is bone marrow edema within the superior vertebral body. There is also mild downward bowing deformity of the superior endplate of L3, which also demonstrates mild edema along the endplate. The vertebral body has lost about 15% of its height centrally. The L1, L2 and L5 vertebrae are intact and normal in signal intensity. Conus medullaris and cauda equina: Conus extends to the L1-2 level. Conus and cauda equina appear normal. Paraspinal and other soft tissues: Unremarkable. Disc levels: Very mild disc bulging at L2-3 and L3-4, without significant spinal canal or neural foraminal stenosis. There is grade 2 anterolisthesis at L5-S1, with diffuse pseudo disc bulging. There is mild central spinal canal stenosis and mild-to-moderate bilateral neural foraminal stenosis, worse on the left. No definite nerve root impingement. There is a primordial disc space at S1-2. IMPRESSION: 1. Mild compression fractures of L3 and L4, associated with mild bone marrow edema, more pronounced at L4, suggesting an acute on chronic fracture of L4 and an acute/subacute fracture of L3,  which is mildly edematous. 2. Grade 2 anterolisthesis at L5-S1 with moderate bilateral neural foraminal stenosis, but no definite nerve root impingement. 3. Transitional anatomy. Electronically Signed   By: Evalene Coho M.D.   On: 03/11/2024 17:09      LOS: 2 days    Elgin Lam, MD [facility] 03/12/2024, 10:30 AM   If 7PM-7AM, please contact night-coverage [email or web address]

## 2024-03-13 ENCOUNTER — Encounter (HOSPITAL_COMMUNITY): Payer: Self-pay | Admitting: Family Medicine

## 2024-03-13 DIAGNOSIS — S32030A Wedge compression fracture of third lumbar vertebra, initial encounter for closed fracture: Secondary | ICD-10-CM | POA: Diagnosis not present

## 2024-03-13 NOTE — TOC Initial Note (Signed)
 Transition of Care [facility]) - Initial/Assessment Note    Patient Details  Name: Tracy Park MRN: [id number] Date of Birth: 01/23/1956  [facility]) CM/SW Contact:    Heather DELENA Saltness, LCSW Phone Number: 03/13/2024, 3:57 PM  Clinical Narrative:                 Pt presented to hospital with complaints of SOB and back pain. Pt currently active with [facility] for home hospice services. Pt is full code. Pt has acute L3 and L4 compression fracture, requiring surgery. Surgery tentatively scheduled for Wednesday 7/9 or Thursday 7/10. TOC will continue to follow for discharge needs.    Expected Discharge Plan: Home w Hospice Care Barriers to Discharge: Continued Medical Work up   Patient Goals and CMS Choice Patient states their goals for this hospitalization and ongoing recovery are:: To return home        Expected Discharge Plan and Services   Discharge Planning Services: NA Post Acute Care Choice: Hospice Living arrangements for the past 2 months: Single Family Home                 DME Arranged: N/A DME Agency: NA       HH Arranged: NA HH Agency: NA        Prior Living Arrangements/Services Living arrangements for the past 2 months: Single Family Home Lives with:: Self Patient language and need for interpreter reviewed:: Yes Do you feel safe going back to the place where you live?: Yes      Need for Family Participation in Patient Care: Yes (Comment) Care giver support system in place?: Yes (comment) Current home services: Hospice, DME (Home O2 through [employer] and Hospice with [facility]) Criminal Activity/Legal Involvement Pertinent to Current Situation/Hospitalization: No - Comment as needed  Activities of Daily Living   ADL Screening (condition at time of admission) Independently performs ADLs?: Yes (appropriate for developmental age) Is the patient deaf or have difficulty hearing?: No Does the patient have difficulty seeing, even when wearing  glasses/contacts?: No Does the patient have difficulty concentrating, remembering, or making decisions?: No  Permission Sought/Granted Permission sought to share information with : Case Manager, Family Supports Permission granted to share information with : Yes, Verbal Permission Granted  Share Information with NAME: Odella Pinal  Permission granted to share info w AGENCY: [facility]  Permission granted to share info w Relationship: Daughter  Permission granted to share info w Contact Information: [phone]  Emotional Assessment Appearance:: Appears stated age Attitude/Demeanor/Rapport: Unable to Assess Affect (typically observed): Unable to Assess Orientation: : Oriented to Self, Oriented to Place, Oriented to  Time, Oriented to Situation Alcohol  / Substance Use: Not Applicable Psych Involvement: No (comment)  Admission diagnosis:  Compression fracture of lumbar vertebra (HCC) [S32.000A] Lumbar compression fracture (HCC) [S32.000A] Low back pain without sciatica, unspecified back pain laterality, unspecified chronicity [M54.50] Closed compression fracture of L3 vertebra (HCC) [S32.030A] Closed compression fracture of L3 vertebra, initial encounter (HCC) [S32.030A] Patient Active Problem List   Diagnosis Date Noted   Closed compression fracture of L3 vertebra (HCC) 03/10/2024   Compression fracture of lumbar vertebra (HCC) 03/10/2024   Lumbar compression fracture (HCC) 03/10/2024   Hypokalemia 02/06/2024   Factitious disorder with predominantly psychological signs and symptoms 01/30/2024   Palliative care encounter 01/30/2024   Need for emotional support 01/30/2024   Counseling and coordination of care 01/30/2024   Goals of care, counseling/discussion 01/30/2024   COPD exacerbation (HCC) 01/30/2024   Acute on chronic  respiratory failure with hypoxia and hypercapnia (HCC) 12/13/2023   COPD with acute exacerbation 09/22/2023   Paroxysmal atrial fibrillation (HCC)  02/23/2023   Pulmonary nodule 01/25/2023   COPD (chronic obstructive pulmonary disease) (HCC) 01/25/2023   Essential hypertension 01/25/2023   DDD (degenerative disc disease), cervical 03/24/2022   Bipolar disorder (HCC) 03/24/2022   Dyslipidemia 03/13/2022   Anxiety and depression 03/13/2022   History of lung cancer 03/13/2022   Chronic respiratory failure with hypoxia and hypercapnia (HCC) 02/26/2022   Normocytic anemia 02/08/2022   Malnutrition of moderate degree 08/25/2021   Coronary artery disease involving native coronary artery of native heart without angina pectoris 08/18/2018   Chronic bilateral low back pain without sciatica 08/21/2016   Obstructive sleep apnea (adult) (pediatric) 02/22/2015   Hyperlipidemia 06/03/2013   Vitamin D  deficiency 06/03/2013   Hypothyroidism 04/28/2012   Tobacco use 04/28/2012   PCP:  Corlis Longs, FNP Pharmacy:   [id number] [address], [place] - [address] [place] [address] Phone: [phone] Fax: [phone]  [place] [facility] [address] [place] [address] Phone: [phone] Fax: [phone]     Social Drivers of Health (SDOH) Social History: SDOH Screenings   Food Insecurity: No Food Insecurity (03/10/2024)  Recent Concern: Food Insecurity - Food Insecurity Present (03/03/2024)  Housing: Low Risk  (03/10/2024)  Transportation Needs: No Transportation Needs (03/10/2024)  Utilities: Not At Risk (03/10/2024)  Financial Resource Strain: High Risk (10/08/2023)   Received from [facility]  Physical Activity: Unknown (04/07/2023)   Received from [facility]  Social Connections: Socially Isolated (03/10/2024)  Stress: Stress Concern Present (04/07/2023)   Received from [facility]  Tobacco Use: High Risk (03/13/2024)   SDOH Interventions: None indicated     Readmission Risk Interventions    03/13/2024    3:54 PM 03/04/2024    2:47 PM  01/14/2024    2:11 PM  Readmission Risk Prevention Plan  Transportation Screening Complete Complete Complete  Medication Review Oceanographer) Complete Complete Complete  PCP or Specialist appointment within 3-5 days of discharge Complete Complete Complete  HRI or Home Care Consult Complete Complete --  SW Recovery Care/Counseling Consult Complete Complete --  Palliative Care Screening Complete Not Applicable Not Applicable  Skilled Nursing Facility Not Applicable Not Applicable Not Applicable    Heather Saltness, MSW, LCSW 03/13/2024 4:03 PM

## 2024-03-13 NOTE — Progress Notes (Signed)
 [facility] Patient Visit   Tracy Park is a current [facility] hospice patient with a terminal diagnosis of chronic obstructive pulmonary disease with acute exacerbation. Patient discharged home Monday afternoon with worsening back pain which only intensified once home. She made the decision to come to the ED for further evaluation and was found to have an acute compression fracture of her lumbar region. Per Dr. Norleen Park with [facility] this is a related hospital admission.    Visited patient at bedside, she is awake and alert currently ordering lunch. Reports she just received pain medication and was waiting for it to take effect. Reports that she spoke with IR regarding kyphoplasty and procedure has been scheduled for Wednesday. 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    She remains inpatient appropriate as they attempt to diagnose and determine treatment options for acute fracture.    Vital Signs: 97.8/68/17   148/89    99% on 2L nasal cannula  I&O: 120/300  Abnormal labs: None new  Diagnostics:  none new  IV/PRN Meds: Tylenol  1000mg  PO x2, Flexeril  10mg  BID PO PRN x2, Melatonin 3mg  PO x1, Oxycodone  10mg  PO q4H prn x4  Problem List as per [facility] Dr. Elgin Park Park Assessment and Park:   L3/L4 compression fracture Per history, occurred during transferring from bed to chair.  CT of the spine was significant for new L3 superior endplate fracture with 25% height loss.  Patient started on pain management schedule and given calcitonin nasal spray.  Back brace provided. MRI confirms L3 and L4 acute/subacute and acute on chronic compression fractures, respectively. - Continue scheduled steroids, lidocaine  patch, Flexeril , oxycodone  - IR consultation for consideration of kyphoplasty secondary to intractable pain per patient with  Park for possible procedure on 7/9. IR recommend to hold Eliquis  on 7/7 in anticipation of procedure   COPD exacerbation Mild.  Patient was given steroids on admission.  No significant symptoms this morning. - Continue steroid taper - Continue Breztri , DuoNeb   Chronic respiratory failure with hypoxia Patient is on 2 L/min of oxygen  which is her baseline.   Primary hypertension - Continue losartan , metoprolol  succinate   Hypothyroidism - Continue Synthroid    History of lung cancer Patient has a history of left upper lobe lobectomy.  Patient has been referred to hospice.  Previously DNR, now full code.   Paroxysmal atrial fibrillation - Continue metoprolol  and Eliquis  - Hold Eliquis  on 7/7   Hyperlipidemia - Continue Crestor    Depression   Discharge Planning: Ongoing  Family Contact: None patient is her own spokesperson  IDT: Updated  Goals of Care: Full Code  Should patient need ambulance transfer at discharge- please use [facility]) as they contract this service for our active hospice patients.   Tracy Park, BSN, [name] Liaison [phone]

## 2024-03-13 NOTE — Consult Note (Addendum)
 Chief Complaint: Patient was seen in consultation today for back pain  Chief Complaint  Patient presents with   Back Pain   Shortness of Breath   at the request of Briana Shown   Referring Physician(s): Briana Shown   Supervising Physician: Jenna Hacker  Patient Status: [facility] - In-pt  History of Present Illness: Tracy Park is a 68 y.o. female with PMHs of COPD, adenocarcinoma of the lung status post left upper lobe lobectomy, hypothyroidism, anxiety, PTSD, atrial fibrillation on Eliquis , back pain and acute lumbar compression fx, IR was consulted for KP.   Patient came to ED on 7/1 due to back pain and SOB, she was recently admitted from 6/24 to 6/30 due to COPD exacerbation.  Patient had intermittent back pain, she recently started to taking oxycodone  which did not provide much pain relief which prompted her to go to ED. CT L spine was obtained on 7/2 which showed acute L3 and L4 compression fx, patient was admitted for further work up and eval. IR was requested for KP on 7/3, case was reviewed by Dr. Jennefer who recommended further eval with MRI lumbar spine. MRI has been reviewed by Dr. Jenna, patient appears to be a candidate for L3 and L4 KP.   Patient laying in bed, not in acute distress. RN at bedside.  Patient reports constant dull achy pain in the lower back, currently at 9. Any movement makes the patient worse, remaining still make the pain slightly better but not much.  Denise headache, fever, chills, shortness of breath, cough, chest pain, abdominal pain, nausea ,vomiting, and bleeding.  Patient was informed that her Eliquis  will need to be held for 2 days and IR will have to send approval request to insurance on Monday, and it typically takes couple business days to receive approval/denial. She was informed that the procedure is tentatively scheduled for Wednesday PM or Thursday. She verbalized understanding.   Past Medical History:  Diagnosis Date    Adenocarcinoma of lung (HCC) 02/26/2022   Anginal pain (HCC)    Anxiety    Bipolar disorder (HCC)    Cancer (HCC)    COPD (chronic obstructive pulmonary disease) (HCC)    Dyspnea    Family history of adverse reaction to anesthesia    History of depression 08/03/2023   History of kidney stones    Hydroureteronephrosis 08/16/2021   Hypothyroidism    Lung cancer (HCC)    Malignant neoplasm of upper lobe of left lung (HCC) 11/12/2016   Myocardial infarction (HCC)    Paroxysmal atrial fibrillation (HCC)    Paroxysmal atrial fibrillation with RVR (HCC) 01/25/2023   PTSD (post-traumatic stress disorder)    Rib pain on left side 06/24/2023   Sleep apnea    Thyroid  disease     Past Surgical History:  Procedure Laterality Date   ABDOMINAL HYSTERECTOMY     BACK SURGERY     CYSTOSCOPY W/ URETERAL STENT PLACEMENT Right 05/03/2021   Procedure: CYSTOSCOPY WITH RETROGRADE PYELOGRAM/URETERAL STENT PLACEMENT;  Surgeon: Cam Morene ORN, MD;  Location: [facility];  Service: Urology;  Laterality: Right;   EYE SURGERY     kidney stent     thyroidectomy      Allergies: Red dye #40 (allura red), Strawberry extract, Tomato, Aspirin, Tape, and Wound dressing adhesive  Medications: Prior to Admission medications   Medication Sig Start Date End Date Taking? Authorizing Provider  albuterol  (VENTOLIN  HFA) 108 (90 Base) MCG/ACT inhaler Inhale 2 puffs into the lungs every 6 (  six) hours as needed for wheezing or shortness of breath. 03/08/24  Yes Jillian Buttery, MD  budesonide -glycopyrrolate -formoterol  (BREZTRI  AEROSPHERE) 160-9-4.8 MCG/ACT AERO inhaler Inhale 2 puffs into the lungs in the morning, at noon, and at bedtime. Patient taking differently: Inhale 2 puffs into the lungs in the morning and at bedtime. 03/08/24  Yes Jillian Buttery, MD  cyclobenzaprine  (FLEXERIL ) 10 MG tablet Take 1 tablet (10 mg total) by mouth 2 (two) times daily as needed for muscle spasms. Patient taking differently: Take 10 mg  by mouth daily. 02/10/24  Yes Dreama Longs, MD  guaiFENesin  (MUCINEX ) 600 MG 12 hr tablet Take 1 tablet (600 mg total) by mouth 2 (two) times daily. 03/08/24 03/08/25 Yes Adhikari, Buttery, MD  ipratropium-albuterol  (DUONEB) 0.5-2.5 (3) MG/3ML SOLN Take 3 mLs by nebulization 3 (three) times daily. 02/29/24  Yes [provider]  levothyroxine  (SYNTHROID ) 125 MCG tablet Take 125 mcg by mouth daily before breakfast.   Yes [provider]  lidocaine  (LIDODERM ) 5 % Place 1 patch onto the skin daily. Remove & Discard patch within 12 hours or as directed by MD 02/10/24  Yes Dreama Longs, MD  losartan  (COZAAR ) 25 MG tablet Take 12.5 mg by mouth daily. 02/09/24  Yes [provider]  magnesium  oxide (MAG-OX) 400 (240 Mg) MG tablet Take 400 mg by mouth 2 (two) times daily.   Yes [provider]  metoprolol  succinate (TOPROL -XL) 25 MG 24 hr tablet Take 12.5 mg by mouth daily.   Yes [provider]  Morphine  Sulfate (MORPHINE  CONCENTRATE) 10 mg / 0.5 ml concentrated solution SMARTSIG:0.25 Milliliter(s) By Mouth Every 4 Hours PRN 02/19/24  Yes [provider]  nitroGLYCERIN  (NITROSTAT ) 0.4 MG SL tablet Place 0.4 mg under the tongue every 5 (five) minutes as needed for chest pain.   Yes [provider]  oxyCODONE  (ROXICODONE ) 5 MG immediate release tablet Take 1 tablet (5 mg total) by mouth every 4 (four) hours as needed for severe pain (pain score 7-10). 02/10/24  Yes Dreama Longs, MD  OXYGEN  Inhale 2 L/min into the lungs continuous.   Yes [provider]  polyvinyl alcohol  (LIQUIFILM TEARS) 1.4 % ophthalmic solution Place 1 drop into the left eye in the morning, at noon, in the evening, and at bedtime.   Yes [provider]  prednisoLONE  acetate (PRED FORTE ) 1 % ophthalmic suspension Place 1 drop into the left eye 4 (four) times daily. 11/19/23  Yes [provider]  rosuvastatin  (CRESTOR ) 20 MG tablet Take 20 mg by mouth daily.    Yes [provider]  sertraline  (ZOLOFT ) 25 MG tablet Take 25 mg by mouth in the morning.   Yes [provider]  apixaban  (ELIQUIS ) 5 MG TABS tablet Take 1 tablet (5 mg total) by mouth 2 (two) times daily. Patient not taking: Reported on 03/03/2024 06/06/23   Barbarann Nest, MD  predniSONE  (DELTASONE ) 10 MG tablet Take 4 pills daily for 2 days then 3 pills daily for 3 days then 2 pills daily for 3 days then continue taking 1 pill daily Patient not taking: Reported on 03/10/2024 03/09/24   Jillian Buttery, MD  VOLTAREN ARTHRITIS PAIN 1 % GEL Apply 1 Application topically as needed (pain). Patient not taking: Reported on 03/10/2024 03/09/24   [provider]     Family History  Problem Relation Age of Onset   Hypertension Mother    Heart failure Mother    Hypertension Father    Diabetes Father    Heart failure Father  Social History   Socioeconomic History   Marital status: Single    Spouse name: Not on file   Number of children: Not on file   Years of education: Not on file   Highest education level: Not on file  Occupational History   Not on file  Tobacco Use   Smoking status: Every Day    Current packs/day: 0.15    Types: Cigarettes   Smokeless tobacco: Never  Vaping Use   Vaping status: Never Used  Substance and Sexual Activity   Alcohol  use: Not Currently   Drug use: Not Currently   Sexual activity: Not on file  Other Topics Concern   Not on file  Social History Narrative   Lives with daughter   Social Drivers of Health   Financial Resource Strain: High Risk (10/08/2023)   Received from [facility]   Overall Financial Resource Strain (CARDIA)    Difficulty of Paying Living Expenses: Very hard  Food Insecurity: No Food Insecurity (03/10/2024)   Hunger Vital Sign    Worried About Running Out of Food in the Last Year: Never true    Ran Out of Food in the Last Year: Never true  Recent Concern: Food Insecurity - Food Insecurity Present  (03/03/2024)   Hunger Vital Sign    Worried About Running Out of Food in the Last Year: Sometimes true    Ran Out of Food in the Last Year: Sometimes true  Transportation Needs: No Transportation Needs (03/10/2024)   PRAPARE - Administrator, Civil Service (Medical): No    Lack of Transportation (Non-Medical): No  Physical Activity: Unknown (04/07/2023)   Received [facility]   Exercise Vital Sign    On average, how many days per week do you engage in moderate to strenuous exercise (like a brisk walk)?: 0 days    Minutes of Exercise per Session: Not on file  Stress: Stress Concern Present (04/07/2023)   Received from [facility] of Occupational Health - Occupational Stress Questionnaire    Feeling of Stress : To some extent  Social Connections: Socially Isolated (03/10/2024)   Social Connection and Isolation Panel    Frequency of Communication with Friends and Family: More than three times a week    Frequency of Social Gatherings with Friends and Family: Once a week    Attends Religious Services: Never    Database administrator or Organizations: No    Attends Engineer, structural: Never    Marital Status: Divorced     Review of Systems: A 12 point ROS discussed and pertinent positives are indicated in the HPI above.  All other systems are negative.  Vital Signs: BP (!) 148/89   Pulse 68   Temp 97.8 F (36.6 C) (Oral)   Resp 17   Ht 5' 3 (1.6 m)   Wt 108 lb 11 oz (49.3 kg)   SpO2 98%   BMI 19.25 kg/m    Physical Exam Vitals reviewed.  Constitutional:      General: She is not in acute distress.    Appearance: She is not ill-appearing.  HENT:     Head: Normocephalic and atraumatic.     Mouth/Throat:     Mouth: Mucous membranes are moist.     Pharynx: Oropharynx is clear.  Cardiovascular:     Rate and Rhythm: Normal rate and regular rhythm.     Heart sounds: Normal heart sounds.  Pulmonary:     Effort:  Pulmonary effort is  normal.     Breath sounds: Normal breath sounds.     Comments: On 2L baseline  Abdominal:     General: Bowel sounds are normal.     Palpations: Abdomen is soft.  Musculoskeletal:       Arms:     Cervical back: Neck supple.     Comments: TTP lower back, radiates to left at time  Skin:    General: Skin is warm and dry.     Coloration: Skin is not cyanotic or pale.  Neurological:     Mental Status: She is alert and oriented to person, place, and time.  Psychiatric:        Mood and Affect: Mood normal.        Behavior: Behavior normal.        Judgment: Judgment normal.     MD Evaluation Airway: WNL Heart: WNL Abdomen: WNL Chest/ Lungs: WNL ASA  Classification: 3 Mallampati/Airway Score: Two  Imaging: MR LUMBAR SPINE WO CONTRAST Result Date: 03/11/2024 CLINICAL DATA:  Lumbar compression fracture. EXAM: MRI LUMBAR SPINE WITHOUT CONTRAST TECHNIQUE: Multiplanar, multisequence MR imaging of the lumbar spine was performed. No intravenous contrast was administered. COMPARISON:  CT lumbar spine dated March 09, 2024. FINDINGS: Segmentation: There is transitional anatomy present. In order to maintain consistency with the prior reporting, the last rib-bearing vertebral body will be considered L1 and the first sacralized vertebral body is considered S1. Alignment:  Grade 2 anterolisthesis at L5-S1. Vertebrae: There is downward bowing of the superior endplate of L4, which has lost about 30% of its height centrally. There is bone marrow edema within the superior vertebral body. There is also mild downward bowing deformity of the superior endplate of L3, which also demonstrates mild edema along the endplate. The vertebral body has lost about 15% of its height centrally. The L1, L2 and L5 vertebrae are intact and normal in signal intensity. Conus medullaris and cauda equina: Conus extends to the L1-2 level. Conus and cauda equina appear normal. Paraspinal and other soft tissues: Unremarkable. Disc levels:  Very mild disc bulging at L2-3 and L3-4, without significant spinal canal or neural foraminal stenosis. There is grade 2 anterolisthesis at L5-S1, with diffuse pseudo disc bulging. There is mild central spinal canal stenosis and mild-to-moderate bilateral neural foraminal stenosis, worse on the left. No definite nerve root impingement. There is a primordial disc space at S1-2. IMPRESSION: 1. Mild compression fractures of L3 and L4, associated with mild bone marrow edema, more pronounced at L4, suggesting an acute on chronic fracture of L4 and an acute/subacute fracture of L3, which is mildly edematous. 2. Grade 2 anterolisthesis at L5-S1 with moderate bilateral neural foraminal stenosis, but no definite nerve root impingement. 3. Transitional anatomy. Electronically Signed   By: Evalene Coho M.D.   On: 03/11/2024 17:09   CT Lumbar Spine Wo Contrast Result Date: 03/10/2024 CLINICAL DATA:  Low back pain, increased fracture risk. EXAM: CT LUMBAR SPINE WITHOUT CONTRAST TECHNIQUE: Multidetector CT imaging of the lumbar spine was performed without intravenous contrast administration. Multiplanar CT image reconstructions were also generated. RADIATION DOSE REDUCTION: This exam was performed according to the departmental dose-optimization program which includes automated exposure control, adjustment of the mA and/or kV according to patient size and/or use of iterative reconstruction technique. COMPARISON:  None Available. FINDINGS: Segmentation: Transitional lumbosacral anatomy. Two be consistent with the prior numbering system, there is a transitional vertebral body that is considered to be S1. Alignment: Grade II anterolisthesis of  L5 on S1. Vertebrae: Similar L4 superior endplate fracture with approximately 25% height loss. Acute L3 superior endplate fracture with 25% height loss and trace bony retropulsion, new since June 9th. Osteopenia. Paraspinal and other soft tissues: Negative. Disc levels: Similar lower  lumbar facet arthropathy. Disc bulge at L5-S1 with at least mild canal stenosis. IMPRESSION: 1. Acute L3 superior endplate fracture with 25% height loss and trace bony retropulsion, new since June 9th. 2. Similar L4 superior endplate fracture with 25% height loss. 3. Similar grade II anterolisthesis of L5 on S1. 4. Transitional lumbosacral anatomy. 5. Osteopenia. Electronically Signed   By: Gilmore GORMAN Molt M.D.   On: 03/10/2024 00:42   DG Chest Portable 1 View Result Date: 03/09/2024 CLINICAL DATA:  Shortness of breath.  History of lung cancer. EXAM: PORTABLE CHEST 1 VIEW COMPARISON:  03/06/2024 FINDINGS: Postsurgical volume loss in the left hemithorax. Stable left apical pleuroparenchymal opacity. The heart is normal in size. Emphysema and bronchial thickening, unchanged from prior. Unchanged blunting of left costophrenic angle. No large effusion. No pneumothorax. On limited assessment, no acute osseous finding. IMPRESSION: 1. No acute findings. 2. Postsurgical volume loss in the left hemithorax. Stable left apical pleuroparenchymal opacity. 3. Emphysema and bronchial thickening. Electronically Signed   By: Andrea Gasman M.D.   On: 03/09/2024 20:08   DG CHEST PORT 1 VIEW Result Date: 03/06/2024 CLINICAL DATA:  [id number] SOB (shortness of breath) [id number] EXAM: PORTABLE CHEST - 1 VIEW COMPARISON:  March 02, 2024 FINDINGS: Chronic apical scarring in the left upper lung zone. Underlying emphysema. No new airspace consolidation, pleural effusion, or pneumothorax. No cardiomegaly. Surgical clips in the left hilum. Aortic atherosclerosis. No acute fracture or destructive lesions. Multilevel thoracic osteophytosis. IMPRESSION: Emphysema.  No acute cardiopulmonary abnormality. Electronically Signed   By: Rogelia Myers M.D.   On: 03/06/2024 12:53   DG Chest 2 View Result Date: 03/02/2024 CLINICAL DATA:  Increased shortness of breath EXAM: CHEST - 2 VIEW COMPARISON:  02/16/2024 FINDINGS: Cardiac shadow is stable.  Postsurgical changes are noted in the left lung with apical scarring. No acute infiltrate or effusion is seen. No bony abnormality is noted IMPRESSION: Chronic changes in the left apex.  No acute abnormality noted. Electronically Signed   By: Oneil Devonshire M.D.   On: 03/02/2024 21:10   CT ABDOMEN PELVIS W CONTRAST Result Date: 02/16/2024 CLINICAL DATA:  Patient fell several days ago. Now with left flank and midline back and hip pain. Patient is on chronic anticoagulation. Evaluate for retroperitoneal bleed or fracture. EXAM: CT ABDOMEN AND PELVIS WITH CONTRAST TECHNIQUE: Multidetector CT imaging of the abdomen and pelvis was performed using the standard protocol following bolus administration of intravenous contrast. RADIATION DOSE REDUCTION: This exam was performed according to the departmental dose-optimization program which includes automated exposure control, adjustment of the mA and/or kV according to patient size and/or use of iterative reconstruction technique. CONTRAST:  75mL OMNIPAQUE  IOHEXOL  300 MG/ML  SOLN COMPARISON:  CT stone study 06/24/2023. FINDINGS: Lower chest: Centrilobular emphysema. Hepatobiliary: No suspicious focal abnormality within the liver parenchyma. There is no evidence for gallstones, gallbladder wall thickening, or pericholecystic fluid. No intrahepatic or extrahepatic biliary dilation. Pancreas: No focal mass lesion. No dilatation of the main duct. No intraparenchymal cyst. No peripancreatic edema. Spleen: No splenomegaly. No suspicious focal mass lesion. Adrenals/Urinary Tract: No adrenal nodule or mass. Early excretion of contrast material noted right kidney. Kidneys otherwise unremarkable. No evidence for hydroureter. The urinary bladder appears normal for the degree of distention. Stomach/Bowel: Stomach  is unremarkable. No gastric wall thickening. No evidence of outlet obstruction. Duodenum is normally positioned as is the ligament of Treitz. No small bowel wall thickening. No  small bowel dilatation. The terminal ileum is normal. The appendix is not well visualized, but there is no edema or inflammation in the region of the cecal tip to suggest appendicitis. No gross colonic mass. No colonic wall thickening. Vascular/Lymphatic: There is moderate atherosclerotic calcification of the abdominal aorta without aneurysm. There is no gastrohepatic or hepatoduodenal ligament lymphadenopathy. No retroperitoneal or mesenteric lymphadenopathy. No pelvic sidewall lymphadenopathy. Reproductive: There is no adnexal mass. Other: No intraperitoneal free fluid. Musculoskeletal: No worrisome lytic or sclerotic osseous abnormality. Spondylolisthesis at L4-5 is similar to prior. Superior endplate compression deformity at L3 is new in the interval IMPRESSION: 1. No acute findings in the abdomen or pelvis. Specifically, no evidence for retroperitoneal hematoma. 2. Superior endplate compression deformity at L3 is new in the interval since prior CT. See dedicated lumbar spine CT report dictated separately. 3. Aortic Atherosclerosis (ICD10-I70.0) and Emphysema (ICD10-J43.9). Electronically Signed   By: Camellia Candle M.D.   On: 02/16/2024 08:45   CT L-SPINE NO CHARGE Result Date: 02/16/2024 CLINICAL DATA:  Status post fall EXAM: CT LUMBAR SPINE WITHOUT CONTRAST TECHNIQUE: Multidetector CT imaging of the lumbar spine was performed without intravenous contrast administration. Multiplanar CT image reconstructions were also generated. RADIATION DOSE REDUCTION: This exam was performed according to the departmental dose-optimization program which includes automated exposure control, adjustment of the mA and/or kV according to patient size and/or use of iterative reconstruction technique. COMPARISON:  Plain films February 10, 2024 FINDINGS: Segmentation: Transitional vertebra to be considered S1 Alignment: Grade 1-2 posterior listhesis of S1 on L5 Vertebrae: Compression fracture deformity of the superior endplate of L4 with  less than 25% loss of height no significant retropulsed fragment in the canal. Mild anterior wedge deformity of T12 can not rule out fracture. Correlation with MRI recommended Paraspinal and other soft tissues: Negative. Disc levels: Circumferential bulging disc L4-5. Circumferential bulging disc L5-S1. No significant foraminal narrowing. IMPRESSION: *Compression fracture deformity of the superior endplate of L4 with less than 25% loss of height no significant retropulsed fragment in the canal. *Mild anterior wedge deformity of T12 can not rule out fracture. Correlation with MRI recommended. *Grade 1-2 posterior listhesis of S1 on L5. *Circumferential bulging disc L4-5 and L5-S1. Electronically Signed   By: Franky Chard M.D.   On: 02/16/2024 08:28   DG Chest Port 1 View Result Date: 02/16/2024 CLINICAL DATA:  Patient fell 3 days ago.  Increased dyspnea. EXAM: PORTABLE CHEST 1 VIEW COMPARISON:  02/05/2024 FINDINGS: Similar volume loss left hemithorax with left apical pleuroparenchymal scarring, stable. Surgical clip noted in the region of the left hilum. Stable blunting left costophrenic angle. Right lung clear. No acute bony abnormality. Telemetry leads overlie the chest. IMPRESSION: 1. No acute cardiopulmonary findings. 2. Stable volume loss left hemithorax with left apical pleuroparenchymal scarring. Electronically Signed   By: Camellia Candle M.D.   On: 02/16/2024 06:31   DG Hip Unilat W or Wo Pelvis 2-3 Views Left Result Date: 02/16/2024 CLINICAL DATA:  Patient fell 3 days ago.  Left hip pain. EXAM: DG HIP (WITH OR WITHOUT PELVIS) 2-3V LEFT COMPARISON:  None Available. FINDINGS: Bones are diffusely demineralized. SI joints and symphysis pubis unremarkable. No evidence for superior or inferior pubic ramus fracture. No findings to suggest femoral head dislocation. AP and frog-leg lateral views of the left hip show no evidence for femoral  neck fracture. IMPRESSION: 1. No acute bony findings. Specifically, no  definite evidence for left femoral neck fracture. 2. Diffuse bony demineralization. Electronically Signed   By: Camellia Candle M.D.   On: 02/16/2024 06:30    Labs:  CBC: Recent Labs    03/07/24 0329 03/09/24 1954 03/10/24 0748 03/11/24 0319  WBC 9.8 12.3* 12.7* 10.8*  HGB 11.6* 13.1 13.2 11.4*  HCT 37.5 41.7 42.2 36.8  PLT 219 274 265 263    COAGS: Recent Labs    07/25/23 0549  INR 1.1  APTT 29    BMP: Recent Labs    03/07/24 0329 03/09/24 1954 03/10/24 0748 03/11/24 0319  NA 136 138 138 141  K 4.7 4.0 3.6 3.5  CL 98 96* 94* 98  CO2 30 34* 34* 36*  GLUCOSE 185* 82 76 116*  BUN 33* 16 15 22   CALCIUM  8.6* 8.7* 8.8* 8.3*  CREATININE 0.66 0.44 0.46 0.50  GFRNONAA >60 >60 >60 >60    LIVER FUNCTION TESTS: Recent Labs    02/06/24 0508 03/09/24 1954 03/10/24 0748 03/11/24 0319  BILITOT 0.5 0.9 0.6 0.4  AST 15 33 22 17  ALT 16 46* 39 31  ALKPHOS 53 84 87 77  PROT 6.2* 6.0* 6.1* 5.5*  ALBUMIN 3.3* 3.4* 3.1* 2.8*    TUMOR MARKERS: No results for input(s): AFPTM, CEA, CA199, CHROMGRNA in the last 8760 hours.  Assessment and Plan: 68 y.o. female with back pain due to acute L3 and L4 compression fx.   CBC showed trending dow leukocytosis with no left shit, last CBC on 7/3. Will repeat.  VSS Allergies reviewed On Eliquis .  Risks and benefits of L3 and L4 KP were discussed with the patient including, but not limited to education regarding the natural healing process of compression fractures without intervention, bleeding, infection, cement migration which may cause spinal cord damage, paralysis, pulmonary embolism or even death.  This interventional procedure involves the use of X-rays and because of the nature of the planned procedure, it is possible that we will have prolonged use of X-ray fluoroscopy.  Potential radiation risks to you include (but are not limited to) the following: - A slightly elevated risk for cancer  several years later in life.  This risk is typically less than 0.5% percent. This risk is low in comparison to the normal incidence of human cancer, which is 33% for women and 50% for men according to the [employer] Society. - Radiation induced injury can include skin redness, resembling a rash, tissue breakdown / ulcers and hair loss (which can be temporary or permanent).   The likelihood of either of these occurring depends on the difficulty of the procedure and whether you are sensitive to radiation due to previous procedures, disease, or genetic conditions.   IF your procedure requires a prolonged use of radiation, you will be notified and given written instructions for further action.  It is your responsibility to monitor the irradiated area for the 2 weeks following the procedure and to notify your physician if you are concerned that you have suffered a radiation induced injury.    All of the patient's questions were answered, patient is agreeable to proceed.  Consent signed and in chart.  The procedure is tentatively scheduled for Wed PM/Thursday pending insurance approval/IR schedule.   PLAN - Asked primary team to start holding Eliquis  on Monday morning (needs 2 day hold.) - NPO except meds on Wed 7 am - CBC with diff and INR on Tuesday  AM, ordered.  - 2 g ancef  to be given during the procedure, not ordered yet.      Thank you for this interesting consult.  I greatly enjoyed meeting Farran L Solar and look forward to participating in their care.  A copy of this report was sent to the requesting provider on this date.  Electronically Signed: Toya VEAR Cousin, PA-C 03/13/2024, 11:28 AM   I spent a total of 40 Minutes    in face to face in clinical consultation, greater than 50% of which was counseling/coordinating care for L3 and L4 KP.   This chart was dictated using voice recognition software.  Despite best efforts to proofread,  errors can occur which can change the documentation meaning.

## 2024-03-13 NOTE — Plan of Care (Signed)
  Problem: Education: Goal: Knowledge of General Education information will improve Description: Including pain rating scale, medication(s)/side effects and non-pharmacologic comfort measures Outcome: Progressing   Problem: Clinical Measurements: Goal: Respiratory complications will improve Outcome: Progressing   Problem: Activity: Goal: Risk for activity intolerance will decrease Outcome: Not Progressing   Problem: Coping: Goal: Level of anxiety will decrease Outcome: Progressing   Problem: Safety: Goal: Ability to remain free from injury will improve Outcome: Progressing   Problem: Skin Integrity: Goal: Risk for impaired skin integrity will decrease Outcome: Progressing

## 2024-03-13 NOTE — Progress Notes (Signed)
 PROGRESS NOTE    Tracy Park  FMW:[id number] DOB: 09-20-1955 DOA: 03/09/2024 PCP: Corlis Longs, FNP   Brief Narrative: Tracy Park is a 68 y.o. female with a history of COPD, adenocarcinoma of the lung status post left upper lobe lobectomy, hypothyroidism, anxiety, PTSD, atrial fibrillation.  Patient presented secondary to back pain was found to have evidence of a new L3 compression fracture.  Patient started on analgesics, calcitonin, back brace for management.  Patient with continued intractable pain and is being considered for possible kyphoplasty.   Assessment and Plan:  L3/L4 compression fracture Per history, occurred during transferring from bed to chair.  CT of the spine was significant for new L3 superior endplate fracture with 25% height loss.  Patient started on pain management schedule and given calcitonin nasal spray.  Back brace provided. MRI confirms L3 and L4 acute/subacute and acute on chronic compression fractures, respectively. - Continue scheduled steroids, lidocaine  patch, Flexeril , oxycodone  - IR consultation for consideration of kyphoplasty secondary to intractable pain per patient with plan for possible procedure on 7/9. IR recommend to hold Eliquis  on 7/7 in anticipation of procedure  COPD exacerbation Mild.  Patient was given steroids on admission.  No significant symptoms this morning. - Continue steroid taper - Continue Breztri , DuoNeb  Chronic respiratory failure with hypoxia Patient is on 2 L/min of oxygen  which is her baseline.  Primary hypertension - Continue losartan , metoprolol  succinate  Hypothyroidism - Continue Synthroid   History of lung cancer Patient has a history of left upper lobe lobectomy.  Patient has been referred to hospice.  Previously DNR, now full code.  Paroxysmal atrial fibrillation - Continue metoprolol  and Eliquis  - Hold Eliquis  on 7/7  Hyperlipidemia - Continue Crestor   Depression - Continue Zoloft    DVT  prophylaxis: Eliquis  Code Status:   Code Status: Full Code Family Communication: None at bedside Disposition Plan: Discharge home pending ability to control pain well enough for patient to tolerate mobility   Consultants:  Interventional radiology  Procedures:  None  Antimicrobials: None   Subjective: Continued back pain. Worked with therapy yesterday and thought she did a bit better.  Objective: BP (!) 148/89   Pulse 68   Temp 97.8 F (36.6 C) (Oral)   Resp 17   Ht 5' 3 (1.6 m)   Wt 49.3 kg   SpO2 98%   BMI 19.25 kg/m   Examination:  General exam: Appears calm and comfortable Respiratory system: Clear to auscultation. Respiratory effort normal. Cardiovascular system: S1 & S2 heard, RRR. No murmurs, rubs, gallops or clicks. Gastrointestinal system: Abdomen is nondistended, soft and non-tender. Normal bowel sounds heard. Central nervous system: Alert and oriented. No focal neurological deficits. Psychiatry: Judgement and insight appear normal. Mood & affect appropriate.    Data Reviewed: I have personally reviewed following labs and imaging studies  CBC Lab Results  Component Value Date   WBC 10.8 (H) 03/11/2024   RBC 3.88 03/11/2024   HGB 11.4 (L) 03/11/2024   HCT 36.8 03/11/2024   MCV 94.8 03/11/2024   MCH 29.4 03/11/2024   PLT 263 03/11/2024   MCHC 31.0 03/11/2024   RDW 14.3 03/11/2024   LYMPHSABS 2.3 03/10/2024   MONOABS 1.1 (H) 03/10/2024   EOSABS 0.0 03/10/2024   BASOSABS 0.0 03/10/2024     Last metabolic panel Lab Results  Component Value Date   NA 141 03/11/2024   K 3.5 03/11/2024   CL 98 03/11/2024   CO2 36 (H) 03/11/2024   BUN 22 03/11/2024  CREATININE 0.50 03/11/2024   GLUCOSE 116 (H) 03/11/2024   GFRNONAA >60 03/11/2024   GFRAA >60 04/12/2020   CALCIUM  8.3 (L) 03/11/2024   PHOS 2.1 (L) 12/30/2023   PROT 5.5 (L) 03/11/2024   ALBUMIN 2.8 (L) 03/11/2024   BILITOT 0.4 03/11/2024   ALKPHOS 77 03/11/2024   AST 17 03/11/2024   ALT  31 03/11/2024   ANIONGAP 7 03/11/2024    GFR: Estimated Creatinine Clearance: 54.6 mL/min (by C-G formula based on SCr of 0.5 mg/dL).  No results found for this or any previous visit (from the past 240 hours).     Radiology Studies: MR LUMBAR SPINE WO CONTRAST Result Date: 03/11/2024 CLINICAL DATA:  Lumbar compression fracture. EXAM: MRI LUMBAR SPINE WITHOUT CONTRAST TECHNIQUE: Multiplanar, multisequence MR imaging of the lumbar spine was performed. No intravenous contrast was administered. COMPARISON:  CT lumbar spine dated March 09, 2024. FINDINGS: Segmentation: There is transitional anatomy present. In order to maintain consistency with the prior reporting, the last rib-bearing vertebral body will be considered L1 and the first sacralized vertebral body is considered S1. Alignment:  Grade 2 anterolisthesis at L5-S1. Vertebrae: There is downward bowing of the superior endplate of L4, which has lost about 30% of its height centrally. There is bone marrow edema within the superior vertebral body. There is also mild downward bowing deformity of the superior endplate of L3, which also demonstrates mild edema along the endplate. The vertebral body has lost about 15% of its height centrally. The L1, L2 and L5 vertebrae are intact and normal in signal intensity. Conus medullaris and cauda equina: Conus extends to the L1-2 level. Conus and cauda equina appear normal. Paraspinal and other soft tissues: Unremarkable. Disc levels: Very mild disc bulging at L2-3 and L3-4, without significant spinal canal or neural foraminal stenosis. There is grade 2 anterolisthesis at L5-S1, with diffuse pseudo disc bulging. There is mild central spinal canal stenosis and mild-to-moderate bilateral neural foraminal stenosis, worse on the left. No definite nerve root impingement. There is a primordial disc space at S1-2. IMPRESSION: 1. Mild compression fractures of L3 and L4, associated with mild bone marrow edema, more pronounced  at L4, suggesting an acute on chronic fracture of L4 and an acute/subacute fracture of L3, which is mildly edematous. 2. Grade 2 anterolisthesis at L5-S1 with moderate bilateral neural foraminal stenosis, but no definite nerve root impingement. 3. Transitional anatomy. Electronically Signed   By: Evalene Coho M.D.   On: 03/11/2024 17:09      LOS: 3 days    Elgin Lam, MD [facility] 03/13/2024, 11:43 AM   If 7PM-7AM, please contact night-coverage [email or web address]

## 2024-03-13 NOTE — Plan of Care (Signed)
  Problem: Clinical Measurements: Goal: Ability to maintain clinical measurements within normal limits will improve Outcome: Progressing   Problem: Activity: Goal: Risk for activity intolerance will decrease Outcome: Progressing   Problem: Nutrition: Goal: Adequate nutrition will be maintained Outcome: Progressing   Problem: Pain Managment: Goal: General experience of comfort will improve and/or be controlled Outcome: Progressing

## 2024-03-14 DIAGNOSIS — S32030A Wedge compression fracture of third lumbar vertebra, initial encounter for closed fracture: Secondary | ICD-10-CM | POA: Diagnosis not present

## 2024-03-14 NOTE — Progress Notes (Addendum)
 PROGRESS NOTE    Tracy Park  FMW:[id number] DOB: [birth year] DOA: 03/09/2024 PCP: Corlis Longs, FNP   Brief Narrative: Tracy Park is a 68 y.o. female with a history of COPD, adenocarcinoma of the lung status post left upper lobe lobectomy, hypothyroidism, anxiety, PTSD, atrial fibrillation.  Patient presented secondary to back pain was found to have evidence of a new L3 compression fracture.  Patient started on analgesics, calcitonin, back brace for management.  Patient with continued intractable pain and is being considered for possible kyphoplasty.   Assessment and Plan:  L3/L4 compression fracture Per history, occurred during transferring from bed to chair.  CT of the spine was significant for new L3 superior endplate fracture with 25% height loss.  Patient started on pain management schedule and given calcitonin nasal spray.  Back brace provided. MRI confirms L3 and L4 acute/subacute and acute on chronic compression fractures, respectively. - Continue scheduled steroids, lidocaine  patch, Flexeril , oxycodone  - IR consultation for consideration of kyphoplasty secondary to intractable pain per patient with plan for possible procedure on 7/9. IR recommend to hold Eliquis  on 7/7 in anticipation of procedure  COPD exacerbation Mild.  Patient was given steroids on admission. - Continue steroid taper - Continue Breztri , DuoNeb  Chronic respiratory failure with hypoxia Patient is on 2 L/min of oxygen  which is her baseline.  Primary hypertension - Continue losartan , metoprolol  succinate  Hypothyroidism - Continue Synthroid   History of lung cancer Patient has a history of left upper lobe lobectomy.  Patient has been referred to hospice.  Previously DNR, now full code.  Paroxysmal atrial fibrillation - Continue metoprolol  and Eliquis  - Hold Eliquis  on 7/7  Hyperlipidemia - Continue Crestor   Depression - Continue Zoloft    DVT prophylaxis: Eliquis  Code Status:   Code  Status: Full Code Family Communication: None at bedside Disposition Plan: Discharge home pending ability to control pain well enough for patient to tolerate mobility   Consultants:  Interventional radiology  Procedures:  None  Antimicrobials: None   Subjective: Continued back pain. No other concern.  Objective: BP 120/81 (BP Location: Left Arm)   Pulse 85   Temp 97.9 F (36.6 C) (Oral)   Resp 16   Ht 5' 3 (1.6 m)   Wt 49.3 kg   SpO2 96%   BMI 19.25 kg/m   Examination:  General exam: Appears calm and comfortable Respiratory system: Respiratory effort normal. Central nervous system: Alert and oriented. No focal neurological deficits. Psychiatry: Judgement and insight appear normal. Mood & affect appropriate.    Data Reviewed: I have personally reviewed following labs and imaging studies  CBC Lab Results  Component Value Date   WBC 10.8 (H) 03/11/2024   RBC 3.88 03/11/2024   HGB 11.4 (L) 03/11/2024   HCT 36.8 03/11/2024   MCV 94.8 03/11/2024   MCH 29.4 03/11/2024   PLT 263 03/11/2024   MCHC 31.0 03/11/2024   RDW 14.3 03/11/2024   LYMPHSABS 2.3 03/10/2024   MONOABS 1.1 (H) 03/10/2024   EOSABS 0.0 03/10/2024   BASOSABS 0.0 03/10/2024     Last metabolic panel Lab Results  Component Value Date   NA 141 03/11/2024   K 3.5 03/11/2024   CL 98 03/11/2024   CO2 36 (H) 03/11/2024   BUN 22 03/11/2024   CREATININE 0.50 03/11/2024   GLUCOSE 116 (H) 03/11/2024   GFRNONAA >60 03/11/2024   GFRAA >60 04/12/2020   CALCIUM  8.3 (L) 03/11/2024   PHOS 2.1 (L) 12/30/2023   PROT 5.5 (L) 03/11/2024  ALBUMIN 2.8 (L) 03/11/2024   BILITOT 0.4 03/11/2024   ALKPHOS 77 03/11/2024   AST 17 03/11/2024   ALT 31 03/11/2024   ANIONGAP 7 03/11/2024    GFR: Estimated Creatinine Clearance: 54.6 mL/min (by C-G formula based on SCr of 0.5 mg/dL).  No results found for this or any previous visit (from the past 240 hours).     Radiology Studies: No results found.      LOS: 4 days    Elgin Lam, MD [facility] 03/14/2024, 2:34 PM   If 7PM-7AM, please contact night-coverage [email or web address]

## 2024-03-14 NOTE — Plan of Care (Signed)
  Problem: Clinical Measurements: Goal: Ability to maintain clinical measurements within normal limits will improve Outcome: Progressing   Problem: Activity: Goal: Risk for activity intolerance will decrease Outcome: Progressing   Problem: Pain Managment: Goal: General experience of comfort will improve and/or be controlled Outcome: Progressing   Problem: Safety: Goal: Ability to remain free from injury will improve Outcome: Progressing

## 2024-03-14 NOTE — Progress Notes (Signed)
 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 Patient discharged home Monday afternoon with worsening back pain which only intensified once home. She made the decision to come to the ED for further evaluation and was found to have an acute compression fracture of her lumbar region. Per Dr. Norleen Laurence with [facility] this is a related hospital admission.    Visited patient at bedside, she was resting so I did not wake her. Had O2 [place] on and did not appear to be in any acute distress. Continues to receive PRN pain medication for back. Per provider notes, plan is still kyphoplasty scheduled for Wednesday.   She remains inpatient appropriate as they attempt to diagnose and determine treatment options for acute fracture.    Vital Signs: 97.9/85/16   120/81    96% on 2L nasal cannula   I&O: 1920/700   Abnormal labs: None new   Diagnostics:  none new   IV/PRN Meds: Tylenol  1000mg  PO x3, Flexeril  10mg  BID PO PRN x2, Melatonin 3mg  PO x1, Morphine  4mg  IV x2, Oxycodone  10mg  PO q4H prn x3   Problem List as per [facility] Dr. Elgin Lam 7.6.25 Assessment and Plan: L3/L4 compression fracture Per history, occurred during transferring from bed to chair.  CT of the spine was significant for new L3 superior endplate fracture with 25% height loss.  Patient started on pain management schedule and given calcitonin nasal spray.  Back brace provided. MRI confirms L3 and L4 acute/subacute and acute on chronic compression fractures, respectively. - Continue scheduled steroids, lidocaine  patch, Flexeril , oxycodone  - IR consultation for consideration of kyphoplasty secondary to intractable pain per patient with plan for possible procedure on 7/9. IR recommend to hold Eliquis  on 7/7 in anticipation of procedure  COPD exacerbation Mild.  Patient was given steroids on admission. - Continue steroid taper - Continue Breztri ,  DuoNeb   Chronic respiratory failure with hypoxia Patient is on 2 L/min of oxygen  which is her baseline.   Primary hypertension - Continue losartan , metoprolol  succinate   Hypothyroidism - Continue Synthroid    History of lung cancer Patient has a history of left upper lobe lobectomy.  Patient has been referred to hospice.  Previously DNR, now full code.   Paroxysmal atrial fibrillation - Continue metoprolol  and Eliquis  - Hold Eliquis  on 7/7   Hyperlipidemia - Continue Crestor    Depression - Continue Zoloft    Discharge Planning: Ongoing   Family Contact: None patient is her own spokesperson   IDT: Updated   Goals of Care: Full Code   Should patient need ambulance transfer at discharge- please use [facility]) as they contract this service for our active hospice patients.    Nat Babe, BSN, [facility] Liaison [phone]

## 2024-03-15 DIAGNOSIS — J441 Chronic obstructive pulmonary disease with (acute) exacerbation: Secondary | ICD-10-CM

## 2024-03-15 DIAGNOSIS — S32030A Wedge compression fracture of third lumbar vertebra, initial encounter for closed fracture: Secondary | ICD-10-CM | POA: Diagnosis not present

## 2024-03-15 NOTE — Progress Notes (Signed)
 Physical Therapy Treatment Patient Details Name: Tracy Park MRN: [id number] DOB: September 10, 1955 Today's Date: 03/15/2024   History of Present Illness 68 year old female re[facility]ad[facility] to [facility] on 03/09/24 after beginning home hospice care for pain control as well as PT/OT in the setting of acute L3 compression fracture after presenting with acute worsening of her low back discomfort when attempting to sit down.  Previous recent admission on 03/02/24 for Acute COPD exacerbation.  Past medical history of hypertension, depression, anxiety, paroxysmal A-fib on Eliquis , lung cancer status post lumpectomy, severe COPD with multiple admissions now enrolled with outpatient hospice, chronic hypoxic/hypercarbic respiratory failure.    PT Comments  Pt progressing steadily, limitations d/t pain and fatigue; amb ~ 40' with min assist, 3 brief standing rests needed with SpO2= 96% on 2L. Reviewed proper breathing techniques. D/cplan remains appropriate at this time.    If plan is discharge home, recommend the following: A little help with walking and/or transfers;A little help with bathing/dressing/bathroom;Assistance with cooking/housework;Assist for transportation;Help with stairs or ramp for entrance   Can travel by private vehicle        Equipment Recommendations  None recommended by PT    Recommendations for Other Services       Precautions / Restrictions Precautions Precautions: Fall Recall of Precautions/Restrictions: Intact Precaution/Restrictions Comments: 2L O2 [place] baseline Required Braces or Orthoses: Spinal Brace Spinal Brace: Lumbar corset;Applied in sitting position Restrictions Weight Bearing Restrictions Per Provider Order: No Other Position/Activity Restrictions: reviewed LSO, applying any time OOB including with nursing staff     Mobility  Bed Mobility Overal bed mobility: Needs Assistance Bed Mobility: Rolling, Sidelying to Sit, Sit to Supine Rolling: Supervision, Used  rails Sidelying to sit: Contact guard assist   Sit to supine: Min assist   General bed mobility comments: does not adhere to instructions for log roll but HOB elevated and no assist required, use of rails; pt assisted with donning LSO at edge of bed - requiring cues to don/doff but states I know how    Transfers Overall transfer level: Needs assistance Equipment used: Rolling walker (2 wheels) Transfers: Sit to/from Stand Sit to Stand: Min assist           General transfer comment: light assist to stabilize    Ambulation/Gait Ambulation/Gait assistance: Min assist, Contact guard assist Gait Distance (Feet): 40 Feet Assistive device: Rolling walker (2 wheels) Gait Pattern/deviations: Step-through pattern, Decreased stride length, Narrow base of support, Trunk flexed Gait velocity: decr     General Gait Details: initially declined RW but agreeable with encouragement. pt states she does not like using any type of walker, deferred rollator this session d/t heavy reliance on UEs. intermittent assist to steady, 3 brief standing rests.   Stairs             Wheelchair Mobility     Tilt Bed    Modified Rankin (Stroke Patients Only)       Balance Overall balance assessment: Mild deficits observed, not formally tested                                          Communication Communication Communication: No apparent difficulties  Cognition Arousal: Alert Behavior During Therapy: Flat affect   PT - Cognitive impairments: No apparent impairments  Following commands: Intact      Cueing Cueing Techniques: Verbal cues  Exercises      General Comments        Pertinent Vitals/Pain Pain Assessment Pain Assessment: Faces Faces Pain Scale: Hurts whole lot Pain Location: back Pain Descriptors / Indicators: Discomfort, Grimacing, Guarding Pain Intervention(s): Limited activity within patient's tolerance,  Monitored during session, Premedicated before session, Repositioned    Home Living                          Prior Function            PT Goals (current goals can now be found in the care plan section) Acute Rehab PT Goals Patient Stated Goal: go home PT Goal Formulation: With patient Time For Goal Achievement: 03/24/24 Potential to Achieve Goals: Good Progress towards PT goals: Progressing toward goals    Frequency    Min 3X/week      PT Plan      Co-evaluation              AM-PAC PT 6 Clicks Mobility   Outcome Measure  Help needed turning from your back to your side while in a flat bed without using bedrails?: A Little Help needed moving from lying on your back to sitting on the side of a flat bed without using bedrails?: A Little Help needed moving to and from a bed to a chair (including a wheelchair)?: A Little Help needed standing up from a chair using your arms (e.g., wheelchair or bedside chair)?: A Little Help needed to walk in hospital room?: A Little Help needed climbing 3-5 steps with a railing? : A Lot 6 Click Score: 17    End of Session Equipment Utilized During Treatment: Gait belt;Oxygen  Activity Tolerance: Patient limited by fatigue;Patient limited by pain Patient left: in bed;with call bell/phone within reach;with bed alarm set   PT Visit Diagnosis: Difficulty in walking, not elsewhere classified (R26.2)     Time: 8954-8895 PT Time Calculation (min) (ACUTE ONLY): 19 min  Charges:    $Gait Training: 8-22 mins PT General Charges $$ ACUTE PT VISIT: 1 Visit                     Shelitha Magley, PT  Acute Rehab Dept [facility]) [phone]  03/15/2024    [facility] 03/15/2024, 11:11 AM

## 2024-03-15 NOTE — Plan of Care (Signed)
   Problem: Activity: Goal: Risk for activity intolerance will decrease Outcome: Progressing   Problem: Pain Managment: Goal: General experience of comfort will improve and/or be controlled Outcome: Progressing   Problem: Safety: Goal: Ability to remain free from injury will improve Outcome: Progressing

## 2024-03-15 NOTE — Progress Notes (Signed)
 [facility] 1325 [facility] Patient Visit   Tracy Park is a current [facility] hospice patient with a terminal diagnosis of chronic obstructive pulmonary disease with acute exacerbation. Patient discharged home Monday afternoon with worsening back pain which only intensified once home. She made the decision to come to the ED for further evaluation and was found to have an acute compression fracture of her lumbar region. Per Dr. Norleen Laurence with [facility] this is a related hospital admission.    Visited patient at bedside, she appears to be sleeping. RN stated there are no new updates or complaints of pain. Plan remains for kyphoplasty on Wednesday.   She remains inpatient appropriate as they attempt to diagnose and determine treatment options for acute fracture.    Vital Signs: 98.6/77/18  134/86  100% on 2L nasal cannula   I&O: no intake documented, output 300   Abnormal labs: None new   Diagnostics:  none new   IV/PRN Meds: Tylenol  1000mg  PO x1,  Flexeril  10mg  BID PO PRN x1,  Melatonin 3mg  PO x0,  Oxycodone  10mg  PO q4H prn x3, Morphine  5mg  IV q4H PRN x1   Problem List as per [facility] Dr. Elgin Lam 7.6.25 Assessment and Plan:   L3/L4 compression fracture Per history, occurred during transferring from bed to chair.  CT of the spine was significant for new L3 superior endplate fracture with 25% height loss.  Patient started on pain management schedule and given calcitonin nasal spray.  Back brace provided. MRI confirms L3 and L4 acute/subacute and acute on chronic compression fractures, respectively. - Continue scheduled steroids, lidocaine  patch, Flexeril , oxycodone  - IR consultation for consideration of kyphoplasty secondary to intractable pain per patient with plan for possible procedure on 7/9. IR recommend to hold Eliquis  on 7/7 in anticipation of procedure   COPD exacerbation Mild.  Patient was given steroids on admission.  No significant symptoms this  morning. - Continue steroid taper - Continue Breztri , DuoNeb   Chronic respiratory failure with hypoxia Patient is on 2 L/min of oxygen  which is her baseline.   Primary hypertension - Continue losartan , metoprolol  succinate   Hypothyroidism - Continue Synthroid    History of lung cancer Patient has a history of left upper lobe lobectomy.  Patient has been referred to hospice.  Previously DNR, now full code.   Paroxysmal atrial fibrillation - Continue metoprolol  and Eliquis  - Hold Eliquis  on 7/7   Hyperlipidemia - Continue Crestor    Depression   Discharge Planning: Ongoing, plan for dc after kyphoplasty   Family Contact: None patient is her own spokesperson   IDT: Updated   Goals of Care: Full Code   Should patient need ambulance transfer at discharge- please use [facility]) as they contract this service for our active hospice patients.   Greig Basket, RN, [facility] Liaison [phone]

## 2024-03-15 NOTE — Plan of Care (Signed)

## 2024-03-15 NOTE — Progress Notes (Signed)
 PROGRESS NOTE    Tracy Park  FMW:[id number] DOB: Jan 21, 1956 DOA: 03/09/2024 PCP: Corlis Longs, FNP   Brief Narrative: Tracy Park is a 68 y.o. female with a history of COPD, adenocarcinoma of the lung status post left upper lobe lobectomy, hypothyroidism, anxiety, PTSD, atrial fibrillation.  Patient presented secondary to back pain was found to have evidence of a new L3 compression fracture.  Patient started on analgesics, calcitonin, back brace for management.  Patient with continued intractable pain and is being considered for possible kyphoplasty.   Assessment and Plan:  L3/L4 compression fracture Per history, occurred during transferring from bed to chair.  CT of the spine was significant for new L3 superior endplate fracture with 25% height loss.  Patient started on pain management schedule and given calcitonin nasal spray.  Back brace provided. MRI confirms L3 and L4 acute/subacute and acute on chronic compression fractures, respectively. - Continue scheduled steroids, lidocaine  patch, Flexeril , oxycodone  - IR consultation for consideration of kyphoplasty secondary to intractable pain per patient with plan for possible procedure on 7/9. IR recommend to hold Eliquis  on 7/7 in anticipation of procedure  COPD exacerbation Mild.  Patient was given steroids on admission. - Continue steroid taper - Continue Breztri , DuoNeb  Chronic respiratory failure with hypoxia Patient is on 2 L/min of oxygen  which is her baseline.  Primary hypertension - Continue losartan , metoprolol  succinate  Hypothyroidism - Continue Synthroid   History of lung cancer Patient has a history of left upper lobe lobectomy.  Patient has been referred to hospice.  Previously DNR, now full code.  Paroxysmal atrial fibrillation - Continue metoprolol  and Eliquis  - Hold Eliquis  on 7/7 for Kyphoplasty  Hyperlipidemia - Continue Crestor   Depression - Continue Zoloft    DVT prophylaxis: Eliquis   (held). SCDs Code Status:   Code Status: Full Code Family Communication: None at bedside Disposition Plan: Discharge home pending ability to control pain well enough for patient to tolerate mobility   Consultants:  Interventional radiology  Procedures:  None  Antimicrobials: None   Subjective: Pain is somewhat better controlled at rest. Still with concerns for pain control with mobility.  Objective: BP 134/86 (BP Location: Left Arm)   Pulse 77   Temp 98.6 F (37 C) (Oral)   Resp 18   Ht 5' 3 (1.6 m)   Wt 49.3 kg   SpO2 100%   BMI 19.25 kg/m   Examination:  General exam: Appears calm and comfortable Respiratory system: Respiratory effort normal. Central nervous system: Alert and oriented.  Psychiatry: Judgement and insight appear normal. Mood & affect appropriate.    Data Reviewed: I have personally reviewed following labs and imaging studies  CBC Lab Results  Component Value Date   WBC 10.8 (H) 03/11/2024   RBC 3.88 03/11/2024   HGB 11.4 (L) 03/11/2024   HCT 36.8 03/11/2024   MCV 94.8 03/11/2024   MCH 29.4 03/11/2024   PLT 263 03/11/2024   MCHC 31.0 03/11/2024   RDW 14.3 03/11/2024   LYMPHSABS 2.3 03/10/2024   MONOABS 1.1 (H) 03/10/2024   EOSABS 0.0 03/10/2024   BASOSABS 0.0 03/10/2024     Last metabolic panel Lab Results  Component Value Date   NA 141 03/11/2024   K 3.5 03/11/2024   CL 98 03/11/2024   CO2 36 (H) 03/11/2024   BUN 22 03/11/2024   CREATININE 0.50 03/11/2024   GLUCOSE 116 (H) 03/11/2024   GFRNONAA >60 03/11/2024   GFRAA >60 04/12/2020   CALCIUM  8.3 (L) 03/11/2024  PHOS 2.1 (L) 12/30/2023   PROT 5.5 (L) 03/11/2024   ALBUMIN 2.8 (L) 03/11/2024   BILITOT 0.4 03/11/2024   ALKPHOS 77 03/11/2024   AST 17 03/11/2024   ALT 31 03/11/2024   ANIONGAP 7 03/11/2024    GFR: Estimated Creatinine Clearance: 54.6 mL/min (by C-G formula based on SCr of 0.5 mg/dL).  No results found for this or any previous visit (from the past 240  hours).     Radiology Studies: No results found.     LOS: 5 days    Elgin Lam, MD [facility] 03/15/2024, 12:10 PM   If 7PM-7AM, please contact night-coverage [email or web address]

## 2024-03-16 DIAGNOSIS — J441 Chronic obstructive pulmonary disease with (acute) exacerbation: Secondary | ICD-10-CM | POA: Diagnosis not present

## 2024-03-16 DIAGNOSIS — S32030A Wedge compression fracture of third lumbar vertebra, initial encounter for closed fracture: Secondary | ICD-10-CM | POA: Diagnosis not present

## 2024-03-16 LAB — CBC WITH DIFFERENTIAL/PLATELET
Abs Immature Granulocytes: 0.69 K/uL — ABNORMAL HIGH (ref 0.00–0.07)
Basophils Absolute: 0.1 K/uL (ref 0.0–0.1)
Basophils Relative: 0 %
Eosinophils Absolute: 0 K/uL (ref 0.0–0.5)
Eosinophils Relative: 0 %
HCT: 33.6 % — ABNORMAL LOW (ref 36.0–46.0)
Hemoglobin: 10.4 g/dL — ABNORMAL LOW (ref 12.0–15.0)
Immature Granulocytes: 5 %
Lymphocytes Relative: 17 %
Lymphs Abs: 2.3 K/uL (ref 0.7–4.0)
MCH: 29.2 pg (ref 26.0–34.0)
MCHC: 31 g/dL (ref 30.0–36.0)
MCV: 94.4 fL (ref 80.0–100.0)
Monocytes Absolute: 0.9 K/uL (ref 0.1–1.0)
Monocytes Relative: 6 %
Neutro Abs: 10 K/uL — ABNORMAL HIGH (ref 1.7–7.7)
Neutrophils Relative %: 72 %
Platelets: 209 K/uL (ref 150–400)
RBC: 3.56 MIL/uL — ABNORMAL LOW (ref 3.87–5.11)
RDW: 14.4 % (ref 11.5–15.5)
WBC: 13.9 K/uL — ABNORMAL HIGH (ref 4.0–10.5)
nRBC: 0 % (ref 0.0–0.2)

## 2024-03-16 LAB — PROTIME-INR
INR: 0.9 (ref 0.8–1.2)
Prothrombin Time: 12.5 s (ref 11.4–15.2)

## 2024-03-16 MED ORDER — CEFAZOLIN SODIUM-DEXTROSE 2-4 GM/100ML-% IV SOLN: 2.0000 g | INTRAVENOUS | Status: AC

## 2024-03-16 NOTE — Plan of Care (Signed)
   Problem: Activity: Goal: Risk for activity intolerance will decrease Outcome: Progressing   Problem: Pain Managment: Goal: General experience of comfort will improve and/or be controlled Outcome: Progressing   Problem: Safety: Goal: Ability to remain free from injury will improve Outcome: Progressing

## 2024-03-16 NOTE — Progress Notes (Signed)
 Occupational Therapy Treatment Patient Details Name: Tracy Park MRN: [id number] DOB: December 13, 1955 Today's Date: 03/16/2024   History of present illness 68 year old female re-admitted to [facility] on 03/09/24 after beginning home hospice care for pain control as well as PT/OT in the setting of acute L3 compression fracture after presenting with acute worsening of her low back discomfort when attempting to sit down.  Previous recent admission on 03/02/24 for Acute COPD exacerbation.  Past medical history of hypertension, depression, anxiety, paroxysmal A-fib on Eliquis , lung cancer status post lumpectomy, severe COPD with multiple admissions now enrolled with outpatient hospice, chronic hypoxic/hypercarbic respiratory failure.   OT comments  Pt significantly limited by pain today, which in turn affected her SOB. Able to complete ADL and limited mobility with CGA. States she had difficulty standing from her toilet this am. Pt has a BSC at home which will need to be placed over her toilet. Hopeful pt will progress with mobility and ADL s/p scheduled kyphoplasty tomorrow. Will follow up after surgery to facilitate safe DC home as pt will have limited support after DC and plan tis to DC home with Hospice services. Discussed need for increased Aide services with hospice coordinator. .       If plan is discharge home, recommend the following:  A little help with walking and/or transfers;A little help with bathing/dressing/bathroom;Assistance with cooking/housework;Assist for transportation   Equipment Recommendations   (Pt has BSC at home)    Recommendations for Other Services      Precautions / Restrictions Precautions Precautions: Fall Recall of Precautions/Restrictions: Intact Precaution/Restrictions Comments: 2L O2 [place] baseline Required Braces or Orthoses: Spinal Brace Spinal Brace: Lumbar corset;Applied in sitting position       Mobility Bed Mobility Overal bed mobility: Needs Assistance              General bed mobility comments: encouraged log rolling however pt states I need to do it how I can do it    Transfers Overall transfer level: Needs assistance Equipment used: Rolling walker (2 wheels) Transfers: Sit to/from Stand Sit to Stand: Contact guard assist           General transfer comment: States she needed help earlier standing up from the toilet     Balance Overall balance assessment: Mild deficits observed, not formally tested                                         ADL either performed or assessed with clinical judgement   ADL Overall ADL's : Needs assistance/impaired     Grooming: Set up;Sitting               Lower Body Dressing: Contact guard assist;Sit to/from stand   Toilet Transfer:  (discussed using her BSC over her toilet at home to increase independence with transfers. States she would have someone do this for her.) Statistician Details (indicate cue type and reason): unable to ambualte to toilet due to back pain and SOB         Functional mobility during ADLs: Contact guard assist;Cueing for safety;Rolling walker (2 wheels) General ADL Comments: able to donn sicks EOB; agreeabel to ambulate to the bathroom however unabel to due to pain    Extremity/Trunk Assessment Upper Extremity Assessment Upper Extremity Assessment: Overall WFL for tasks assessed   Lower Extremity Assessment Lower Extremity Assessment: Defer to PT evaluation  Vision       Perception     Praxis     Communication     Cognition Arousal: Alert Behavior During Therapy: Flat affect Cognition: No apparent impairments                                        Cueing      Exercises Exercises: Other exercises Other Exercises Other Exercises: encouraged use of incentive spirometer    Shoulder Instructions       General Comments      Pertinent Vitals/ Pain       Pain Assessment Pain Assessment:  0-10 Pain Score: 10-Worst pain ever Pain Location: back (worse with movement) Pain Descriptors / Indicators: Discomfort, Grimacing, Guarding Pain Intervention(s): Limited activity within patient's tolerance, Premedicated before session  Home Living                                          Prior Functioning/Environment              Frequency  Min 2X/week        Progress Toward Goals  OT Goals(current goals can now be found in the care plan section)  Progress towards OT goals: Progressing toward goals  Acute Rehab OT Goals Patient Stated Goal: for pain to decrease after procedure tomorrow Time For Goal Achievement: 03/24/24 Potential to Achieve Goals: Fair ADL Goals Pt Will Perform Grooming: with supervision;standing Pt Will Transfer to Toilet: ambulating;with supervision Pt Will Perform Toileting - Clothing Manipulation and hygiene: sitting/lateral leans;sit to/from stand Additional ADL Goal #1: Pt will verbalize and follow 3/3 back precautions without cues during ADLs and mobility to ensure proper healing of lumbar fracture. Additional ADL Goal #2: Pt will correctly don and doff LSO brace with setup assistance only.  Plan      Co-evaluation                 AM-PAC OT 6 Clicks Daily Activity     Outcome Measure   Help from another person eating meals?: None Help from another person taking care of personal grooming?: A Little Help from another person toileting, which includes using toliet, bedpan, or urinal?: A Little Help from another person bathing (including washing, rinsing, drying)?: A Little Help from another person to put on and taking off regular upper body clothing?: A Little Help from another person to put on and taking off regular lower body clothing?: A Little 6 Click Score: 19    End of Session Equipment Utilized During Treatment: Gait belt;Rolling walker (2 wheels);Oxygen  (2L)  OT Visit Diagnosis: Unsteadiness on feet  (R26.81);Pain Pain - part of body:  (back)   Activity Tolerance Patient limited by pain   Patient Left in bed;with call bell/phone within reach   Nurse Communication Mobility status        Time: 1034 (1034)-1055 OT Time Calculation (min): 21 min  Charges: OT General Charges $OT Visit: 1 Visit OT Treatments $Self Care/Home Management : 8-22 mins  Kreg Sink, OT/L   Acute OT Clinical Specialist Acute Rehabilitation Services Pager [phone] Office [phone]   [facility] 03/16/2024, 11:10 AM

## 2024-03-16 NOTE — Progress Notes (Signed)
 [facility] 1325 [facility] Patient Visit   Tracy Park is a current [facility] hospice patient with a terminal diagnosis of chronic obstructive pulmonary disease with acute exacerbation. Patient discharged home Monday afternoon with worsening back pain which only intensified once home. She made the decision to come to the ED for further evaluation and was found to have an acute compression fracture of her lumbar region. Per Dr. Norleen Laurence with [facility] this is a related hospital admission.    Patient reports she is having significant pain during my visit, which she has just received medication for. She reports that her pain is being managed well with the current medication plan. She is hopeful that the kyphoplasty will give her ongoing pain relief. Plan remains for kyphoplasty on Wednesday. Patient discusses not feeling supported by her daughters; allowed to discuss her feelings about this as well as her Cylee.    She remains inpatient appropriate for ongoing evaluation and treatment of L3, L4 fractures.    Vital Signs: 97.8/81/18    144/96   O2 99% on 2L via [place]   I&O: 480/1200   Abnormal labs: WBC 13.9, HGB 10.4   Diagnostics:  none new   IV/PRN Meds: Tylenol  1000mg  PO every 8 hours ATC, Flexeril  10mg  po x 1, oxycodone  5mg  po x 1, oxycodone  10mg  po x 2   Problem List from note 7.7 Assessment and Plan:  L3/L4 compression fracture Per history, occurred during transferring from bed to chair.  CT of the spine was significant for new L3 superior endplate fracture with 25% height loss.  Patient started on pain management schedule and given calcitonin nasal spray.  Back brace provided. MRI confirms L3 and L4 acute/subacute and acute on chronic compression fractures, respectively. - Continue scheduled steroids, lidocaine  patch, Flexeril , oxycodone  - IR consultation for consideration of kyphoplasty secondary to intractable pain per patient with plan for possible  procedure on 7/9. IR recommend to hold Eliquis  on 7/7 in anticipation of procedure   COPD exacerbation Mild.  Patient was given steroids on admission. - Continue steroid taper - Continue Breztri , DuoNeb   Chronic respiratory failure with hypoxia Patient is on 2 L/min of oxygen  which is her baseline.   Primary hypertension - Continue losartan , metoprolol  succinate   Hypothyroidism - Continue Synthroid    History of lung cancer Patient has a history of left upper lobe lobectomy.  Patient has been referred to hospice.  Previously DNR, now full code.   Paroxysmal atrial fibrillation - Continue metoprolol  and Eliquis  - Hold Eliquis  on 7/7 for Kyphoplasty   Hyperlipidemia - Continue Crestor    Depression - Continue Zoloft    Discharge Planning: Ongoing, plan for dc after kyphoplasty   Family Contact: None patient is her own spokesperson   IDT: Updated   Goals of Care: Full Code   Should patient need ambulance transfer at discharge- please use [facility]) as they contract this service for our active hospice patients.    Elouise Husband, BSN, RN, [facility] Liaison [phone]

## 2024-03-16 NOTE — Progress Notes (Signed)
 The patient stated that she wants to be a partial code and currently she is a full code. Messaged Lynwood Kipper.

## 2024-03-16 NOTE — Progress Notes (Signed)
       Code Status/Type confirmation of interventions  NAME: Tracy Park MRN: [id number] DOB : 07-14-1956    Date of Service   03/16/2024   HPI/Events of Note   Notified by RN for Patient and Family request to be DNI only.   The following were discussed with patient/family member in the presence of the RN/personally.    Patient has verified :   CPR  - No Shock - No ACLS - No Vent -   No                                  BiPAP - Yes                     Vasopressors - Yes    Interventions   Plan: Code status changed to Limited as Above          [facility] [place]

## 2024-03-16 NOTE — Progress Notes (Signed)
 Patient ID: Tracy Park, female   DOB: 09-Feb-1956, 68 y.o.   MRN: [id number] Insurance has approved patient for L3/4 kyphoplasty; will tentatively plan procedure for 7/9.  Above plans discussed with patient.  No acute changes from initial consultation on 7/5; patient has no further questions at this time.  Consent signed and in chart.

## 2024-03-16 NOTE — Progress Notes (Signed)
 PROGRESS NOTE    [name]  FMW:[id number] DOB: 1955/09/12 DOA: 03/09/2024 PCP: Corlis Longs, FNP   Brief Narrative: Tracy Park is a 68 y.o. female with a history of COPD, adenocarcinoma of the lung status post left upper lobe lobectomy, hypothyroidism, anxiety, PTSD, atrial fibrillation.  Patient presented secondary to back pain was found to have evidence of a new L3 compression fracture.  Patient started on analgesics, calcitonin, back brace for management.  Patient with continued intractable pain and is being considered for possible kyphoplasty.   Assessment and Plan:  L3/L4 compression fracture Per history, occurred during transferring from bed to chair.  CT of the spine was significant for new L3 superior endplate fracture with 25% height loss.  Patient started on pain management schedule and given calcitonin nasal spray.  Back brace provided. MRI confirms L3 and L4 acute/subacute and acute on chronic compression fractures, respectively. - Continue scheduled steroids, lidocaine  patch, Flexeril , oxycodone  - IR consultation for consideration of kyphoplasty secondary to intractable pain per patient with plan for possible procedure on 7/9. IR recommend to hold Eliquis  on 7/7 in anticipation of procedure - NPO after midnight in anticipation of possible procedure  COPD exacerbation Mild.  Patient was given steroids on admission. - Continue steroid taper - Continue Breztri , DuoNeb  Chronic respiratory failure with hypoxia Patient is on 2 L/min of oxygen  which is her baseline.  Primary hypertension - Continue losartan , metoprolol  succinate  Hypothyroidism - Continue Synthroid   History of lung cancer Patient has a history of left upper lobe lobectomy.  Patient has been referred to hospice.  Previously DNR, now full code.  Paroxysmal atrial fibrillation - Continue metoprolol  and Eliquis  - Hold Eliquis  on 7/7 for Kyphoplasty  Hyperlipidemia - Continue  Crestor   Depression - Continue Zoloft    DVT prophylaxis: Eliquis  (held). SCDs Code Status:   Code Status: Full Code Family Communication: None at bedside Disposition Plan: Discharge home pending ability to control pain well enough for patient to tolerate mobility   Consultants:  Interventional radiology  Procedures:  None  Antimicrobials: None   Subjective: No new concerns today, per patient.  Objective: BP (!) 144/96 (BP Location: Right Arm)   Pulse 81   Temp 97.8 F (36.6 C) (Oral)   Resp 18   Ht 5' 3 (1.6 m)   Wt 45.2 kg   SpO2 99%   BMI 17.65 kg/m   Examination:  General exam: Appears calm and comfortable Respiratory system: Respiratory effort normal. Central nervous system: Alert and oriented.  Psychiatry: Judgement and insight appear normal. Mood & affect appropriate.    Data Reviewed: I have personally reviewed following labs and imaging studies  CBC Lab Results  Component Value Date   WBC 13.9 (H) 03/16/2024   RBC 3.56 (L) 03/16/2024   HGB 10.4 (L) 03/16/2024   HCT 33.6 (L) 03/16/2024   MCV 94.4 03/16/2024   MCH 29.2 03/16/2024   PLT 209 03/16/2024   MCHC 31.0 03/16/2024   RDW 14.4 03/16/2024   LYMPHSABS 2.3 03/16/2024   MONOABS 0.9 03/16/2024   EOSABS 0.0 03/16/2024   BASOSABS 0.1 03/16/2024     Last metabolic panel Lab Results  Component Value Date   NA 141 03/11/2024   K 3.5 03/11/2024   CL 98 03/11/2024   CO2 36 (H) 03/11/2024   BUN 22 03/11/2024   CREATININE 0.50 03/11/2024   GLUCOSE 116 (H) 03/11/2024   GFRNONAA >60 03/11/2024   GFRAA >60 04/12/2020   CALCIUM  8.3 (L) 03/11/2024  PHOS 2.1 (L) 12/30/2023   PROT 5.5 (L) 03/11/2024   ALBUMIN 2.8 (L) 03/11/2024   BILITOT 0.4 03/11/2024   ALKPHOS 77 03/11/2024   AST 17 03/11/2024   ALT 31 03/11/2024   ANIONGAP 7 03/11/2024    GFR: Estimated Creatinine Clearance: 50 mL/min (by C-G formula based on SCr of 0.5 mg/dL).  No results found for this or any previous visit  (from the past 240 hours).     Radiology Studies: No results found.     LOS: 6 days    Elgin Lam, MD [facility] 03/16/2024, 12:28 PM   If 7PM-7AM, please contact night-coverage [email or web address]

## 2024-03-17 ENCOUNTER — Inpatient Hospital Stay (HOSPITAL_COMMUNITY)

## 2024-03-17 DIAGNOSIS — S32030A Wedge compression fracture of third lumbar vertebra, initial encounter for closed fracture: Secondary | ICD-10-CM | POA: Diagnosis not present

## 2024-03-17 DIAGNOSIS — M4856XA Collapsed vertebra, not elsewhere classified, lumbar region, initial encounter for fracture: Secondary | ICD-10-CM | POA: Diagnosis not present

## 2024-03-17 HISTORY — PX: IR KYPHO EA ADDL LEVEL THORACIC OR LUMBAR: IMG5520

## 2024-03-17 LAB — CBC
HCT: 34.7 % — ABNORMAL LOW (ref 36.0–46.0)
Hemoglobin: 10.7 g/dL — ABNORMAL LOW (ref 12.0–15.0)
MCH: 29.3 pg (ref 26.0–34.0)
MCHC: 30.8 g/dL (ref 30.0–36.0)
MCV: 95.1 fL (ref 80.0–100.0)
Platelets: 216 K/uL (ref 150–400)
RBC: 3.65 MIL/uL — ABNORMAL LOW (ref 3.87–5.11)
RDW: 14.6 % (ref 11.5–15.5)
WBC: 14.2 K/uL — ABNORMAL HIGH (ref 4.0–10.5)
nRBC: 0 % (ref 0.0–0.2)

## 2024-03-17 LAB — COMPREHENSIVE METABOLIC PANEL WITH GFR
ALT: 26 U/L (ref 0–44)
AST: 22 U/L (ref 15–41)
Albumin: 3.4 g/dL — ABNORMAL LOW (ref 3.5–5.0)
Alkaline Phosphatase: 107 U/L (ref 38–126)
Anion gap: 9 (ref 5–15)
BUN: 12 mg/dL (ref 8–23)
CO2: 32 mmol/L (ref 22–32)
Calcium: 8.8 mg/dL — ABNORMAL LOW (ref 8.9–10.3)
Chloride: 98 mmol/L (ref 98–111)
Creatinine, Ser: 0.39 mg/dL — ABNORMAL LOW (ref 0.44–1.00)
GFR, Estimated: >60 mL/min (ref >60)
Glucose, Bld: 101 mg/dL — ABNORMAL HIGH (ref 70–99)
Potassium: 4.1 mmol/L (ref 3.5–5.1)
Sodium: 139 mmol/L (ref 135–145)
Total Bilirubin: 0.5 mg/dL (ref 0.0–1.2)
Total Protein: 6.4 g/dL — ABNORMAL LOW (ref 6.5–8.1)

## 2024-03-17 LAB — BASIC METABOLIC PANEL WITH GFR
Anion gap: 9 (ref 5–15)
BUN: 21 mg/dL (ref 8–23)
CO2: 30 mmol/L (ref 22–32)
Calcium: 8.7 mg/dL — ABNORMAL LOW (ref 8.9–10.3)
Chloride: 100 mmol/L (ref 98–111)
Creatinine, Ser: 0.37 mg/dL — ABNORMAL LOW (ref 0.44–1.00)
GFR, Estimated: >60 mL/min (ref >60)
Glucose, Bld: 107 mg/dL — ABNORMAL HIGH (ref 70–99)
Potassium: 3.6 mmol/L (ref 3.5–5.1)
Sodium: 139 mmol/L (ref 135–145)

## 2024-03-17 MED ORDER — FENTANYL CITRATE (PF) 100 MCG/2ML IJ SOLN
INTRAMUSCULAR | Status: AC | PRN
  Administered 2024-03-17: 50 ug via INTRAVENOUS

## 2024-03-17 MED ORDER — IOHEXOL 300 MG/ML  SOLN: 50.0000 mL | Freq: Once | INTRAMUSCULAR | Status: DC | PRN

## 2024-03-17 MED ORDER — MIDAZOLAM HCL 2 MG/2ML IJ SOLN
INTRAMUSCULAR | Status: AC | PRN
  Administered 2024-03-17: 1 mg via INTRAVENOUS

## 2024-03-17 MED ORDER — IOHEXOL 300 MG/ML  SOLN
50.0000 mL | Freq: Once | INTRAMUSCULAR | Status: AC | PRN
  Administered 2024-03-17: 10 mL

## 2024-03-17 MED ORDER — MIDAZOLAM HCL 2 MG/2ML IJ SOLN
INTRAMUSCULAR | Status: AC | PRN
  Administered 2024-03-17: 2 mg via INTRAVENOUS

## 2024-03-17 MED ORDER — BUPIVACAINE HCL (PF) 0.5 % IJ SOLN
INTRAMUSCULAR | Status: AC
  Filled 2024-03-17: qty 30

## 2024-03-17 MED ORDER — CEFAZOLIN SODIUM-DEXTROSE 2-4 GM/100ML-% IV SOLN
INTRAVENOUS | Status: AC | PRN
  Administered 2024-03-17: 2 g via INTRAVENOUS

## 2024-03-17 MED ORDER — BUPIVACAINE IN DEXTROSE 0.75-8.25 % IT SOLN: 30.0000 mL | INTRATHECAL | Status: DC | PRN

## 2024-03-17 MED ORDER — LIDOCAINE HCL (PF) 1 % IJ SOLN
INTRAMUSCULAR | Status: AC
  Filled 2024-03-17: qty 30

## 2024-03-17 NOTE — Progress Notes (Signed)
  Progress Note   Patient: Tracy Park FMW:[id number] DOB: 05/15/1956 DOA: 03/09/2024     7 DOS: the patient was seen and examined on 03/17/2024   Brief hospital course: Tracy Park is a 68 y.o. female with a history of COPD, adenocarcinoma of the lung status post left upper lobe lobectomy, hypothyroidism, anxiety, PTSD, atrial fibrillation.  Patient presented secondary to back pain was found to have evidence of a new L3 compression fracture.  Patient started on analgesics, calcitonin, back brace for management.  Patient with continued intractable pain and is being considered for possible kyphoplasty.  Assessment and Plan:   L3/L4 compression fracture Per history, occurred during transferring from bed to chair.  CT of the spine was significant for new L3 superior endplate fracture with 25% height loss.  Patient started on pain management schedule and given calcitonin nasal spray.  Back brace provided. MRI confirms L3 and L4 acute/subacute and acute on chronic compression fractures, respectively. - Continue scheduled steroids, lidocaine  patch, Flexeril , oxycodone  - IR consultation for consideration of kyphoplasty secondary to intractable pain per patient with plan for possible procedure on 7/9. IR recommend to hold Eliquis  on 7/7 in anticipation of procedure - NPO after midnight in anticipation of possible procedure in AM   COPD exacerbation -Mild.   -Patient was given steroids on admission. - Continue steroid taper - Continue Breztri , DuoNeb   Chronic respiratory failure with hypoxia Patient is on 2 L/min of oxygen  which is her baseline.   Primary hypertension - Continue losartan , metoprolol  succinate   Hypothyroidism - Continue Synthroid    History of lung cancer Patient has a history of left upper lobe lobectomy.  Patient has been referred to hospice.  Previously DNR, now full code.   Paroxysmal atrial fibrillation - Continue metoprolol  and Eliquis  - Hold Eliquis  on 7/7 for  Kyphoplasty   Hyperlipidemia - Continue Crestor    Depression - Continue Zoloft    Discharge Planning: Ongoing, plan for dc after kyphoplasty       Subjective: c/o back pain  Physical Exam: Vitals:   03/17/24 0845 03/17/24 0912 03/17/24 1338 03/17/24 1434  BP: (!) 144/87  124/81   Pulse: 71  74   Resp: 20  18   Temp: 98.1 F (36.7 C)  98.4 F (36.9 C)   TempSrc: Oral  Oral   SpO2: 100% 99% 96% 96%  Weight:      Height:       Constitutional: Alert, awake, calm, comfortable HEENT: Neck supple Respiratory: clear to auscultation bilaterally, no wheezing, no crackles. Normal respiratory effort. No accessory muscle use.  Cardiovascular: Regular rate and rhythm, no murmurs / rubs / gallops. No extremity edema. 2+ pedal pulses. No carotid bruits.  Abdomen: no tenderness, no masses palpated. No hepatosplenomegaly. Bowel sounds positive.  Musculoskeletal: no clubbing / cyanosis. No joint deformity upper and lower extremities. Good ROM, no contractures. Normal muscle tone.  Skin: no rashes, lesions, ulcers. No induration Neurologic: CN 2-12 grossly intact. Sensation intact, DTR normal. Strength 5/5 x all 4 extremities.  Psychiatric: Normal judgment and insight. Alert and oriented x 3. Normal mood.   Data Reviewed:  WBC: 14.2, K 3.6  Family Communication: no family was available  Disposition: Status is: Inpatient Remains inpatient appropriate because: clinical improvement, intractable back pain  Planned Discharge Destination: Rehab    Time spent: 35 minutes  Author: Nena Rebel, MD 03/17/2024 2:49 PM  For on call review [email or web address].

## 2024-03-17 NOTE — Procedures (Signed)
 Vascular and Interventional Radiology Procedure Note  Patient: Tracy Park DOB: [birth year] Medical Record Number: [id number] Note Date/Time: 03/17/24 3:59 PM   Performing Physician: Thom Hall, MD Assistant(s): None  Diagnosis: Symptomatic L3 and L4 vertebral body fracture.  Procedure: L3 and L4 VERTEBRAL BODY KYPHOPLASTY  Anesthesia: Conscious Sedation Complications: None Estimated Blood Loss: Minimal Specimens: Sent for None  Findings:  Successful Fluoroscopy-guided L3 and L4 vertebral body, bipedicular Kyphoplasty. A total of 9 mL PMMA was used. Hemostasis of the tract was achieved using Manual Pressure.  Plan: Bed rest for 2 hours.  See detailed procedure note with images in PACS. The patient tolerated the procedure well without incident or complication and was returned to Recovery in stable condition.    Thom Hall, MD Vascular and Interventional Radiology Specialists [facility] Radiology   Pager. [phone] Clinic. [phone]

## 2024-03-17 NOTE — Sedation Documentation (Signed)
 RN Ayrton Mcvay pulled 6 mg Versed  and 300 mcg Fentanyl  in Ir room. Pt. Received 6 mg Versed  and 300 mcg Fentanyl  throughout the procedure.

## 2024-03-17 NOTE — Progress Notes (Signed)
 [facility] Patient Visit   Tracy Park is a current [facility] hospice patient with a terminal diagnosis of chronic obstructive pulmonary disease with acute exacerbation. Patient discharged home Monday afternoon with worsening back pain which only intensified once home. She made the decision to come to the ED for further evaluation and was found to have an acute compression fracture of her lumbar region. Per Dr. Norleen Laurence with [facility] this is a related hospital admission.    Patient reports that the current medication regimen for pain management is no longer adequately controlling her pain. She is glad that the kyphoplasty is happening today, likely this afternoon. Discussed inpatient PT concerns about mobility post discharge and potentially needing aide services at home. Patient verbalizes that her daughter is able to provide any assistance that is needed.    She remains inpatient appropriate for ongoing evaluation and treatment of L3, L4 fractures.    Vital Signs: 98.1/71/20    144/87  O2 100% on 2L via [place]   I&O: ND/800   Abnormal labs: Cre 0.37, Ca 8.7, WBC 14.2, HGB 10.7   Diagnostics:  none new   IV/PRN Meds: oxycodone  10mg  po x 3   Problem List from note 7.8 Assessment and Plan:  L3/L4 compression fracture Per history, occurred during transferring from bed to chair.  CT of the spine was significant for new L3 superior endplate fracture with 25% height loss.  Patient started on pain management schedule and given calcitonin nasal spray.  Back brace provided. MRI confirms L3 and L4 acute/subacute and acute on chronic compression fractures, respectively. - Continue scheduled steroids, lidocaine  patch, Flexeril , oxycodone  - IR consultation for consideration of kyphoplasty secondary to intractable pain per patient with plan for possible procedure on 7/9. IR recommend to hold Eliquis  on 7/7 in anticipation of procedure - NPO after midnight  in anticipation of possible procedure   COPD exacerbation Mild.  Patient was given steroids on admission. - Continue steroid taper - Continue Breztri , DuoNeb   Chronic respiratory failure with hypoxia Patient is on 2 L/min of oxygen  which is her baseline.   Primary hypertension - Continue losartan , metoprolol  succinate   Hypothyroidism - Continue Synthroid    History of lung cancer Patient has a history of left upper lobe lobectomy.  Patient has been referred to hospice.  Previously DNR, now full code.   Paroxysmal atrial fibrillation - Continue metoprolol  and Eliquis  - Hold Eliquis  on 7/7 for Kyphoplasty   Hyperlipidemia - Continue Crestor    Depression - Continue Zoloft    Discharge Planning: Ongoing, plan for dc after kyphoplasty   Family Contact: None patient is her own spokesperson   IDT: Updated   Goals of Care: Limited Code   Should patient need ambulance transfer at discharge- please use [facility]) as they contract this service for our active hospice patients.    Elouise Husband, BSN, RN, [facility] Liaison [phone]

## 2024-03-18 DIAGNOSIS — S32030A Wedge compression fracture of third lumbar vertebra, initial encounter for closed fracture: Secondary | ICD-10-CM | POA: Diagnosis not present

## 2024-03-18 LAB — CBC
HCT: 35.5 % — ABNORMAL LOW (ref 36.0–46.0)
Hemoglobin: 10.9 g/dL — ABNORMAL LOW (ref 12.0–15.0)
MCH: 29.3 pg (ref 26.0–34.0)
MCHC: 30.7 g/dL (ref 30.0–36.0)
MCV: 95.4 fL (ref 80.0–100.0)
Platelets: 200 K/uL (ref 150–400)
RBC: 3.72 MIL/uL — ABNORMAL LOW (ref 3.87–5.11)
RDW: 14.7 % (ref 11.5–15.5)
WBC: 15.2 K/uL — ABNORMAL HIGH (ref 4.0–10.5)
nRBC: 0 % (ref 0.0–0.2)

## 2024-03-18 LAB — MAGNESIUM: Magnesium: 2 mg/dL (ref 1.7–2.4)

## 2024-03-18 MED ORDER — APIXABAN 5 MG PO TABS
5.0000 mg | ORAL_TABLET | Freq: Two times a day (BID) | ORAL | Status: DC
  Administered 2024-03-18 – 2024-03-23 (×11): 5 mg via ORAL
  Filled 2024-03-18 (×11): qty 1

## 2024-03-18 NOTE — TOC Progression Note (Signed)
 Transition of Care [facility]) - Progression Note    Patient Details  Name: Tracy Park MRN: [id number] Date of Birth: March 19, 1956  Transition of Care [facility]) CM/SW Contact  NORMAN ASPEN, LCSW Phone Number: 03/18/2024, 1:48 PM  Clinical Narrative:     Met with pt to discuss potential dc planning needs.  Pt does feel that the IR procedure has helped with decreasing her pain and glad that this was done.  She has worked with PT today with short ambulation distance.  Per [facility] note, pt had indicated that her daughter will be assisting her at dc.  Attempted to confirm this - of note, with last admission, pt had indicated very strained relationship with daughter and she did not feel she could rely on her.  Pt very quickly states, I don't know if she can help.  I don't know anything.  It's the blind leading the blind.  Pt is very clear that she fully anticipates dc home and feels she will be safe there on her own.  Not sure when pt will be medically cleared for dc.  She is active with Hospice via [facility].  TOC will continue to follow for any identified dc needs.  Expected Discharge Plan: Home w Hospice Care Barriers to Discharge: Continued Medical Work up  Expected Discharge Plan and Services   Discharge Planning Services: NA Post Acute Care Choice: Hospice Living arrangements for the past 2 months: Single Family Home                 DME Arranged: N/A DME Agency: NA       HH Arranged: NA HH Agency: NA         Social Determinants of Health (SDOH) Interventions SDOH Screenings   Food Insecurity: No Food Insecurity (03/10/2024)  Recent Concern: Food Insecurity - Food Insecurity Present (03/03/2024)  Housing: Low Risk  (03/10/2024)  Transportation Needs: No Transportation Needs (03/10/2024)  Utilities: Not At Risk (03/10/2024)  Financial Resource Strain: High Risk (10/08/2023)   Received from [facility]  Physical Activity: Unknown (04/07/2023)   Received from [facility]  Social  Connections: Socially Isolated (03/10/2024)  Stress: Stress Concern Present (04/07/2023)   Received from [facility]  Tobacco Use: High Risk (03/13/2024)    Readmission Risk Interventions    03/13/2024    3:54 PM 03/04/2024    2:47 PM 01/14/2024    2:11 PM  Readmission Risk Prevention Plan  Transportation Screening Complete Complete Complete  Medication Review [employer]) Complete Complete Complete  PCP or Specialist appointment within 3-5 days of discharge Complete Complete Complete  HRI or Home Care Consult Complete Complete --  SW Recovery Care/Counseling Consult Complete Complete --  Palliative Care Screening Complete Not Applicable Not Applicable  Skilled Nursing Facility Not Applicable Not Applicable Not Applicable

## 2024-03-18 NOTE — Progress Notes (Signed)
 Physical Therapy Treatment Patient Details Name: SEASON ASTACIO MRN: [id number] DOB: 1956-01-06 Today's Date: 03/18/2024   History of Present Illness 68 year old female re-admitted to [facility] on 03/09/24 after beginning home hospice care for pain control as well as PT/OT in the setting of acute L3 compression fracture after presenting with acute worsening of her low back discomfort when attempting to sit down.  Pt s/p L3/4 kyphoplasties on 03/17/24.  Previous recent admission on 03/02/24 for Acute COPD exacerbation.  Past medical history of hypertension, depression, anxiety, paroxysmal A-fib on Eliquis , lung cancer status post lumpectomy, severe COPD with multiple admissions now enrolled with outpatient hospice, chronic hypoxic/hypercarbic respiratory failure.    PT Comments  Pt s/p L3-4 KP yesterday.  Pt assisted with ambulating however only tolerating short distance due to pain.  Pt reports slight improvement in pain s/p procedure.  Pt anticipates return home upon d/c.     If plan is discharge home, recommend the following: A little help with walking and/or transfers;A little help with bathing/dressing/bathroom;Assistance with cooking/housework;Assist for transportation;Help with stairs or ramp for entrance   Can travel by private vehicle        Equipment Recommendations  None recommended by PT    Recommendations for Other Services       Precautions / Restrictions Precautions Precautions: Fall;Back Recall of Precautions/Restrictions: Intact Precaution/Restrictions Comments: 2L O2 [place] baseline Required Braces or Orthoses: Spinal Brace Spinal Brace: Lumbar corset;Applied in sitting position     Mobility  Bed Mobility Overal bed mobility: Needs Assistance Bed Mobility: Rolling, Sidelying to Sit Rolling: Supervision, Used rails Sidelying to sit: Supervision, HOB elevated, Used rails       General bed mobility comments: pt utilized semi log roll technique however has her own approach; pt  provided LSO once sitting EOB and able to don without assist    Transfers Overall transfer level: Needs assistance Equipment used: Rollator (4 wheels) Transfers: Sit to/from Stand Sit to Stand: Contact guard assist           General transfer comment: cues for hand placement and use of LEs to assist (maintaining back precautions as able)    Ambulation/Gait Ambulation/Gait assistance: Contact guard assist Gait Distance (Feet): 30 Feet Assistive device: Rollator (4 wheels) Gait Pattern/deviations: Step-through pattern, Decreased stride length, Narrow base of support Gait velocity: decr     General Gait Details: cues for use of locks on rollator, distance to tolerance and limited by pain; maintained pt's baseline 2L O2 [place] throughout session   Stairs             Wheelchair Mobility     Tilt Bed    Modified Rankin (Stroke Patients Only)       Balance                                            Communication Communication Communication: No apparent difficulties  Cognition Arousal: Alert Behavior During Therapy: Flat affect   PT - Cognitive impairments: No apparent impairments                         Following commands: Intact      Cueing Cueing Techniques: Verbal cues  Exercises      General Comments        Pertinent Vitals/Pain Pain Assessment Pain Assessment: 0-10 Pain Score: 8  Pain Location: back Pain  Descriptors / Indicators: Discomfort, Grimacing, Guarding, Sore, Tender Pain Intervention(s): Monitored during session, Repositioned    Home Living                          Prior Function            PT Goals (current goals can now be found in the care plan section) Progress towards PT goals: Progressing toward goals    Frequency    Min 3X/week      PT Plan      Co-evaluation              AM-PAC PT 6 Clicks Mobility   Outcome Measure  Help needed turning from your back to your  side while in a flat bed without using bedrails?: A Little Help needed moving from lying on your back to sitting on the side of a flat bed without using bedrails?: A Little Help needed moving to and from a bed to a chair (including a wheelchair)?: A Little Help needed standing up from a chair using your arms (e.g., wheelchair or bedside chair)?: A Little Help needed to walk in hospital room?: A Little Help needed climbing 3-5 steps with a railing? : A Lot 6 Click Score: 17    End of Session Equipment Utilized During Treatment: Gait belt;Oxygen  Activity Tolerance: Patient limited by fatigue;Patient limited by pain Patient left: in chair;with call bell/phone within reach;with chair alarm set   PT Visit Diagnosis: Difficulty in walking, not elsewhere classified (R26.2)     Time: 1134-1150 PT Time Calculation (min) (ACUTE ONLY): 16 min  Charges:    $Gait Training: 8-22 mins PT General Charges $$ ACUTE PT VISIT: 1 Visit                     Tari KLEIN, DPT Physical Therapist Acute Rehabilitation Services Office: [phone]    Tari CROME [place] 03/18/2024, 5:13 PM

## 2024-03-18 NOTE — Progress Notes (Signed)
  Progress Note   Patient: Tracy Park FMW:[id number] DOB: [birth year] DOA: 03/09/2024     8 DOS: the patient was seen and examined on 03/18/2024   Brief hospital course: Tracy Park is a 68 y.o. female with a history of COPD, adenocarcinoma of the lung status post left upper lobe lobectomy, hypothyroidism, anxiety, PTSD, atrial fibrillation.  Patient presented secondary to back pain was found to have evidence of a new L3 compression fracture.  Patient started on analgesics, calcitonin, back brace for management.  Patient with continued intractable pain and now status post kyphoplasty 03/17/2024.  She needs to continue with physical and Occupational Therapy and pain control.  She wants to go home with home hospice once stable.  Assessment and Plan: L3/L4 compression fracture, s/p kyphoplasty by IR 7/9 Per history, occurred during transferring from bed to chair.  CT of the spine was significant for new L3 superior endplate fracture with 25% height loss.  Patient started on pain management schedule and given calcitonin nasal spray.  Back brace provided. MRI confirms L3 and L4 acute/subacute and acute on chronic compression fractures, respectively. -Continue scheduled steroids, lidocaine  patch, Flexeril , oxycodone  -PT/OT/pain control  COPD exacerbation -Mild.   -Patient was given steroids on admission. - Continue steroid taper - Continue Breztri , DuoNeb   Chronic respiratory failure with hypoxia Patient is on 2 L/min of oxygen  which is her baseline.   Primary hypertension - Continue losartan , metoprolol  succinate   Hypothyroidism - Continue Synthroid    History of lung cancer Patient has a history of left upper lobe lobectomy.  Patient has been referred to hospice.  Previously DNR, now full code.   Paroxysmal atrial fibrillation - Continue metoprolol  and Eliquis  - Hold Eliquis  on 7/7 for Kyphoplasty   Hyperlipidemia - Continue Crestor    Depression - Continue Zoloft     Discharge Planning: Ongoing, plan for dc after kyphoplasty     Subjective: Complaints of back pain otherwise no new symptoms  Physical Exam: Vitals:   03/18/24 0500 03/18/24 0623 03/18/24 0856 03/18/24 0857  BP:  128/89 114/77   Pulse:  79 78   Resp:  17 18 18   Temp:  98.4 F (36.9 C) 98.3 F (36.8 C)   TempSrc:  Oral Oral   SpO2:  98% 97% 98%  Weight: 50.3 kg     Height:       Constitutional: Alert, awake, calm, comfortable HEENT: Neck supple Respiratory: clear to auscultation bilaterally, no wheezing, no crackles. Normal respiratory effort. No accessory muscle use.  Cardiovascular: Regular rate and rhythm, no murmurs / rubs / gallops. No extremity edema. 2+ pedal pulses. No carotid bruits.  Abdomen: no tenderness, no masses palpated. No hepatosplenomegaly. Bowel sounds positive.  Musculoskeletal: Surgical site in the lower back appears to be clean, dressing present. Skin: no rashes, lesions, ulcers. No induration Neurologic: CN 2-12 grossly intact. Sensation intact, DTR normal. Strength 5/5 x all 4 extremities.  Psychiatric: Normal judgment and insight. Alert and oriented x 3. Normal mood.   Data Reviewed:  White count 15.2, sodium 139, potassium 4.1, BUN 12, creatinine 0.39  Family Communication: None available  Disposition: Status is: Inpatient Remains inpatient appropriate because: Ongoing back pain after kyphoplasty requiring PT  Planned Discharge Destination: Home with Home Health    Time spent: 35 minutes  Author: Nena Rebel, MD 03/18/2024 2:00 PM  For on call review [email or web address].

## 2024-03-18 NOTE — Progress Notes (Signed)
 Patient ID: Tracy Park, female   DOB: October 23, 1955, 68 y.o.   MRN: [id number]    Referring Physician(s): Nettey,R  Supervising Physician: Philip Cornet  Patient Status:  [facility] - In-pt  Chief Complaint: Lung cancer, back pain, acute lumbar compression fractures; s/p L3/4 kyphoplasties on 03/17/24   Subjective: Pt cont to have some mid to low back pain but much improved since KP procedure yesterday; no new neuro complaints   Allergies: Red dye #40 (allura red), Strawberry extract, Tomato, Aspirin, Tape, and Wound dressing adhesive  Medications: Prior to Admission medications   Medication Sig Start Date End Date Taking? Authorizing Provider  albuterol  (VENTOLIN  HFA) 108 (90 Base) MCG/ACT inhaler Inhale 2 puffs into the lungs every 6 (six) hours as needed for wheezing or shortness of breath. 03/08/24  Yes Jillian Buttery, MD  budesonide -glycopyrrolate -formoterol  (BREZTRI  AEROSPHERE) 160-9-4.8 MCG/ACT AERO inhaler Inhale 2 puffs into the lungs in the morning, at noon, and at bedtime. Patient taking differently: Inhale 2 puffs into the lungs in the morning and at bedtime. 03/08/24  Yes Jillian Buttery, MD  cyclobenzaprine  (FLEXERIL ) 10 MG tablet Take 1 tablet (10 mg total) by mouth 2 (two) times daily as needed for muscle spasms. Patient taking differently: Take 10 mg by mouth daily. 02/10/24  Yes Dreama Longs, MD  guaiFENesin  (MUCINEX ) 600 MG 12 hr tablet Take 1 tablet (600 mg total) by mouth 2 (two) times daily. 03/08/24 03/08/25 Yes Adhikari, Buttery, MD  ipratropium-albuterol  (DUONEB) 0.5-2.5 (3) MG/3ML SOLN Take 3 mLs by nebulization 3 (three) times daily. 02/29/24  Yes [provider]  levothyroxine  (SYNTHROID ) 125 MCG tablet Take 125 mcg by mouth daily before breakfast.   Yes [provider]  lidocaine  (LIDODERM ) 5 % Place 1 patch onto the skin daily. Remove & Discard patch within 12 hours or as directed by MD 02/10/24  Yes Dreama Longs, MD  losartan  (COZAAR ) 25 MG tablet  Take 12.5 mg by mouth daily. 02/09/24  Yes [provider]  magnesium  oxide (MAG-OX) 400 (240 Mg) MG tablet Take 400 mg by mouth 2 (two) times daily.   Yes [provider]  metoprolol  succinate (TOPROL -XL) 25 MG 24 hr tablet Take 12.5 mg by mouth daily.   Yes [provider]  Morphine  Sulfate (MORPHINE  CONCENTRATE) 10 mg / 0.5 ml concentrated solution SMARTSIG:0.25 Milliliter(s) By Mouth Every 4 Hours PRN 02/19/24  Yes [provider]  nitroGLYCERIN  (NITROSTAT ) 0.4 MG SL tablet Place 0.4 mg under the tongue every 5 (five) minutes as needed for chest pain.   Yes [provider]  oxyCODONE  (ROXICODONE ) 5 MG immediate release tablet Take 1 tablet (5 mg total) by mouth every 4 (four) hours as needed for severe pain (pain score 7-10). 02/10/24  Yes Dreama Longs, MD  OXYGEN  Inhale 2 L/min into the lungs continuous.   Yes [provider]  polyvinyl alcohol  (LIQUIFILM TEARS) 1.4 % ophthalmic solution Place 1 drop into the left eye in the morning, at noon, in the evening, and at bedtime.   Yes [provider]  prednisoLONE  acetate (PRED FORTE ) 1 % ophthalmic suspension Place 1 drop into the left eye 4 (four) times daily. 11/19/23  Yes [provider]  rosuvastatin  (CRESTOR ) 20 MG tablet Take 20 mg by mouth daily.   Yes [provider]  sertraline  (ZOLOFT ) 25 MG tablet Take 25 mg by mouth in the morning.   Yes [provider]  apixaban  (ELIQUIS ) 5 MG TABS tablet Take 1 tablet (5 mg total) by mouth 2 (  two) times daily. Patient not taking: Reported on 03/03/2024 06/06/23   Barbarann Nest, MD  predniSONE  (DELTASONE ) 10 MG tablet Take 4 pills daily for 2 days then 3 pills daily for 3 days then 2 pills daily for 3 days then continue taking 1 pill daily Patient not taking: Reported on 03/10/2024 03/09/24   Jillian Buttery, MD  VOLTAREN ARTHRITIS PAIN 1 % GEL Apply 1 Application topically as needed (pain). Patient not taking: Reported  on 03/10/2024 03/09/24   [provider]     Vital Signs: BP 114/77 (BP Location: Right Arm)   Pulse 78   Temp 98.3 F (36.8 C) (Oral)   Resp 18   Ht 5' 3 (1.6 m)   Wt 110 lb 14.3 oz (50.3 kg)   SpO2 98%   BMI 19.64 kg/m   Physical Exam awake/alert; puncture sites L3/4 mildly tender to palpation, blood-tinged gauze dressings in place; no active bleeding noted; moving all fours ok  Imaging: No results found.  Labs:  CBC: Recent Labs    03/11/24 0319 03/16/24 0005 03/17/24 0316 03/18/24 0319  WBC 10.8* 13.9* 14.2* 15.2*  HGB 11.4* 10.4* 10.7* 10.9*  HCT 36.8 33.6* 34.7* 35.5*  PLT 263 209 216 200    COAGS: Recent Labs    07/25/23 0549 03/16/24 0132  INR 1.1 0.9  APTT 29  --     BMP: Recent Labs    03/10/24 0748 03/11/24 0319 03/17/24 0316 03/17/24 1753  NA 138 141 139 139  K 3.6 3.5 3.6 4.1  CL 94* 98 100 98  CO2 34* 36* 30 32  GLUCOSE 76 116* 107* 101*  BUN 15 22 21 12   CALCIUM  8.8* 8.3* 8.7* 8.8*  CREATININE 0.46 0.50 0.37* 0.39*  GFRNONAA >60 >60 >60 >60    LIVER FUNCTION TESTS: Recent Labs    03/09/24 1954 03/10/24 0748 03/11/24 0319 03/17/24 1753  BILITOT 0.9 0.6 0.4 0.5  AST 33 22 17 22   ALT 46* 39 31 26  ALKPHOS 84 87 77 107  PROT 6.0* 6.1* 5.5* 6.4*  ALBUMIN 3.4* 3.1* 2.8* 3.4*    Assessment and Plan Pt with hx lung cancer, acute L3/4 compression fractures; s/p L3/4 KP yesterday; afebrile; WBC 15.2(14.2), hgb stable; back pain improved following procedure; cont PT; other plans as per primary team  Electronically Signed: D. Franky Rakers, PA-C 03/18/2024, 9:43 AM   I spent a total of 15 Minutes at the the patient's bedside AND on the patient's hospital floor or unit, greater than 50% of which was counseling/coordinating care for lumbar 3/4 kyphoplasties

## 2024-03-18 NOTE — Progress Notes (Signed)
 [facility] Patient Visit   Tracy Park is a current [facility] hospice patient with a terminal diagnosis of chronic obstructive pulmonary disease with acute exacerbation. Patient discharged home Monday afternoon with worsening back pain which only intensified once home. She made the decision to come to the ED for further evaluation and was found to have an acute compression fracture of her lumbar region. Per Dr. Norleen Laurence with [facility] this is a related hospital admission.    Patient is resting in chair during my visit and reports pain is much improved post kyphoplasty procedure yesterday.    She remains inpatient appropriate for pain management including recent kyphoplasty and IV medications for pain. Reports is anticipating discharge home soon and feels that her pain will be under good control with oral medications and the effects of the kyphoplasty.   Vital Signs: 98.3/78/18   114/77   O2 97% on O2 2L via [place]   I&O: 700/1450   Abnormal labs: Cre 0.39, Ca 8.8, Alb 3.4, T.Pro 6.4, WBC 15.2, HGB 10.9   Diagnostics:  none new   IV/PRN Meds: oxycodone  10mg  po x 4, Morphine  4mg  IV x 2, Ancef  2G IV x 1, Flexeril  10mg  po x 1, Versed  and fentanyl  IV during procedure.    Problem List from note 7.9 Assessment and Plan:  L3/L4 compression fracture Per history, occurred during transferring from bed to chair.  CT of the spine was significant for new L3 superior endplate fracture with 25% height loss.  Patient started on pain management schedule and given calcitonin nasal spray.  Back brace provided. MRI confirms L3 and L4 acute/subacute and acute on chronic compression fractures, respectively. - Continue scheduled steroids, lidocaine  patch, Flexeril , oxycodone  - IR consultation for consideration of kyphoplasty secondary to intractable pain per patient with plan for possible procedure on 7/9. IR recommend to hold Eliquis  on 7/7 in anticipation of  procedure - NPO after midnight in anticipation of possible procedure in AM   COPD exacerbation -Mild.   -Patient was given steroids on admission. - Continue steroid taper - Continue Breztri , DuoNeb   Chronic respiratory failure with hypoxia Patient is on 2 L/min of oxygen  which is her baseline.   Primary hypertension - Continue losartan , metoprolol  succinate   Hypothyroidism - Continue Synthroid    History of lung cancer Patient has a history of left upper lobe lobectomy.  Patient has been referred to hospice.  Previously DNR, now full code.   Paroxysmal atrial fibrillation - Continue metoprolol  and Eliquis  - Hold Eliquis  on 7/7 for Kyphoplasty   Hyperlipidemia - Continue Crestor    Depression - Continue Zoloft    Discharge Planning: Ongoing, plan for dc after kyphoplasty   Family Contact: None patient is her own spokesperson   IDT: Updated   Goals of Care: Limited Code   Should patient need ambulance transfer at discharge- please use [facility]) as they contract this service for our active hospice patients.    Elouise Husband, BSN, RN, [facility] Liaison [phone]

## 2024-03-19 DIAGNOSIS — S32030A Wedge compression fracture of third lumbar vertebra, initial encounter for closed fracture: Secondary | ICD-10-CM

## 2024-03-19 LAB — BASIC METABOLIC PANEL WITH GFR
Anion gap: 8 (ref 5–15)
BUN: 23 mg/dL (ref 8–23)
CO2: 33 mmol/L — ABNORMAL HIGH (ref 22–32)
Calcium: 8.6 mg/dL — ABNORMAL LOW (ref 8.9–10.3)
Chloride: 97 mmol/L — ABNORMAL LOW (ref 98–111)
Creatinine, Ser: 0.68 mg/dL (ref 0.44–1.00)
GFR, Estimated: >60 mL/min (ref >60)
Glucose, Bld: 115 mg/dL — ABNORMAL HIGH (ref 70–99)
Potassium: 3.9 mmol/L (ref 3.5–5.1)
Sodium: 138 mmol/L (ref 135–145)

## 2024-03-19 LAB — CBC
HCT: 34.7 % — ABNORMAL LOW (ref 36.0–46.0)
Hemoglobin: 10.3 g/dL — ABNORMAL LOW (ref 12.0–15.0)
MCH: 28.5 pg (ref 26.0–34.0)
MCHC: 29.7 g/dL — ABNORMAL LOW (ref 30.0–36.0)
MCV: 96.1 fL (ref 80.0–100.0)
Platelets: 181 K/uL (ref 150–400)
RBC: 3.61 MIL/uL — ABNORMAL LOW (ref 3.87–5.11)
RDW: 14.6 % (ref 11.5–15.5)
WBC: 12.3 K/uL — ABNORMAL HIGH (ref 4.0–10.5)
nRBC: 0 % (ref 0.0–0.2)

## 2024-03-19 NOTE — TOC Progression Note (Signed)
 Transition of Care [facility]) - Progression Note    Patient Details  Name: Tracy Park MRN: [id number] Date of Birth: Jan 10, 1956  Transition of Care [facility]) CM/SW Contact  Sheri ONEIDA [place], [place] Phone Number: 03/19/2024, 2:44 PM  Clinical Narrative:    CSW spoke with pt and pt dtr to discuss dc plan. Pt states that she wanted to see if appropriate for SNF as she is interested in curative care and not wanting to go home w/ hospice at this time. TOC will await updated PT eval.   Expected Discharge Plan: Home w Hospice Care Barriers to Discharge: Continued Medical Work up  Expected Discharge Plan and Services   Discharge Planning Services: NA Post Acute Care Choice: Hospice Living arrangements for the past 2 months: Single Family Home Expected Discharge Date: 03/19/24               DME Arranged: N/A DME Agency: NA       HH Arranged: NA HH Agency: NA         Social Determinants of Health (SDOH) Interventions SDOH Screenings   Food Insecurity: No Food Insecurity (03/10/2024)  Recent Concern: Food Insecurity - Food Insecurity Present (03/03/2024)  Housing: Low Risk  (03/10/2024)  Transportation Needs: No Transportation Needs (03/10/2024)  Utilities: Not At Risk (03/10/2024)  Financial Resource Strain: High Risk (10/08/2023)   Received from [facility]  Physical Activity: Unknown (04/07/2023)   Received from [facility]  Social Connections: Socially Isolated (03/10/2024)  Stress: Stress Concern Present (04/07/2023)   Received from [facility]  Tobacco Use: High Risk (03/13/2024)    Readmission Risk Interventions    03/13/2024    3:54 PM 03/04/2024    2:47 PM 01/14/2024    2:11 PM  Readmission Risk Prevention Plan  Transportation Screening Complete Complete Complete  Medication Review [employer]) Complete Complete Complete  PCP or Specialist appointment within 3-5 days of discharge Complete Complete Complete  HRI or Home Care Consult Complete Complete --  SW Recovery  Care/Counseling Consult Complete Complete --  Palliative Care Screening Complete Not Applicable Not Applicable  Skilled Nursing Facility Not Applicable Not Applicable Not Applicable

## 2024-03-19 NOTE — Discharge Summary (Signed)
 Physician Discharge Summary   Patient: Tracy Park MRN: [id number] DOB: 11-16-1955  Admit date:     03/09/2024  Discharge date: 03/19/24  Discharge Physician: Nena Rebel   PCP: Corlis Longs, FNP   Recommendations at discharge:   Home with home hospice.  Patient was started on hospice before coming to the hospital.  Resume home hospice. Continue pain medication for back pain. Outpatient follow-up with interventional radiology as instructed  Discharge Diagnoses: Principal Problem:   Closed compression fracture of L3 vertebra (HCC) Active Problems:   Compression fracture of lumbar vertebra (HCC)   Lumbar compression fracture (HCC)  Resolved Problems:   * No resolved hospital problems. *  Hospital Course: Tracy Park is a 68 y.o. female with a history of COPD, adenocarcinoma of the lung status post left upper lobe lobectomy, hypothyroidism, anxiety, PTSD, atrial fibrillation.  Patient presented secondary to back pain was found to have evidence of a new L3 compression fracture.  Patient started on analgesics, calcitonin, back brace for management.  Patient with continued intractable pain and now status post kyphoplasty 03/17/2024.  She needs to continue with physical and Occupational Therapy and pain control.  She wants to go home with home hospice once stable. She was discussed today and she agreed to go home on home hospice.  She feels better her pain has improved.  She was able to work with physical therapy.  She has oxygen  2 L at home.  She will be discharged home on home hospice.  Assessment and Plan:  L3/L4 compression fracture, s/p kyphoplasty by IR 7/9 .  Further plans per hospice -Continue scheduled steroids, lidocaine  patch, Flexeril , oxycodone     COPD exacerbation - Stable, resume home oxygen  and home medications  Chronic respiratory failure with hypoxia Patient is on 2 L/min of oxygen  which is her baseline.   Primary hypertension - Continue losartan ,  metoprolol  succinate   Hypothyroidism - Continue Synthroid    History of lung cancer Patient has a history of left upper lobe lobectomy.  Patient is hospice, DNR and wants to resume that.  Paroxysmal atrial fibrillation - Resume home medication metoprolol  and Eliquis , further plans per hospice   Hyperlipidemia - Continue Crestor    Depression - Continue Zoloft      Pain control - [place]  Controlled Substance Reporting System database was reviewed. and patient was instructed, not to drive, operate heavy machinery, perform activities at heights, swimming or participation in water  activities or provide baby-sitting services while on Pain, Sleep and Anxiety Medications; until their outpatient Physician has advised to do so again. Also recommended to not to take more than prescribed Pain, Sleep and Anxiety Medications.  Consultants: Hospice/IR Procedures performed: Kyphoplasty by IR Disposition: Hospice care Diet recommendation:  Discharge Diet Orders (From admission, onward)     Start     Ordered   03/19/24 0000  Diet - low sodium heart healthy        03/19/24 1008           Regular diet DISCHARGE MEDICATION: Allergies as of 03/19/2024       Reactions   Red Dye #40 (allura Red) Hives, Itching, Other (See Comments)   Red food dye   Strawberry Extract Hives, Itching   Tomato Hives, Itching   Aspirin Hives   Tape Rash, Other (See Comments)   Prefers paper tape   Wound Dressing Adhesive Rash        Medication List     TAKE these medications    artificial tears ophthalmic  solution Place 1 drop into the left eye in the morning, at noon, in the evening, and at bedtime.   Breztri  Aerosphere 160-9-4.8 MCG/ACT Aero inhaler Generic drug: budesonide -glycopyrrolate -formoterol  Inhale 2 puffs into the lungs in the morning, at noon, and at bedtime. What changed: when to take this   cyclobenzaprine  10 MG tablet Commonly known as: FLEXERIL  Take 1 tablet (10 mg total)  by mouth 2 (two) times daily as needed for muscle spasms. What changed: when to take this   Eliquis  5 MG Tabs tablet Generic drug: apixaban  Take 1 tablet (5 mg total) by mouth 2 (two) times daily.   guaiFENesin  600 MG 12 hr tablet Commonly known as: Mucinex  Take 1 tablet (600 mg total) by mouth 2 (two) times daily.   ipratropium-albuterol  0.5-2.5 (3) MG/3ML Soln Commonly known as: DUONEB Take 3 mLs by nebulization 3 (three) times daily.   levothyroxine  125 MCG tablet Commonly known as: SYNTHROID  Take 125 mcg by mouth daily before breakfast.   lidocaine  5 % Commonly known as: Lidoderm  Place 1 patch onto the skin daily. Remove & Discard patch within 12 hours or as directed by MD   losartan  25 MG tablet Commonly known as: COZAAR  Take 12.5 mg by mouth daily.   magnesium  oxide 400 (240 Mg) MG tablet Commonly known as: MAG-OX Take 400 mg by mouth 2 (two) times daily.   metoprolol  succinate 25 MG 24 hr tablet Commonly known as: TOPROL -XL Take 12.5 mg by mouth daily.   morphine  CONCENTRATE 10 mg / 0.5 ml concentrated solution SMARTSIG:0.25 Milliliter(s) By Mouth Every 4 Hours PRN   nitroGLYCERIN  0.4 MG SL tablet Commonly known as: NITROSTAT  Place 0.4 mg under the tongue every 5 (five) minutes as needed for chest pain.   oxyCODONE  5 MG immediate release tablet Commonly known as: Roxicodone  Take 1 tablet (5 mg total) by mouth every 4 (four) hours as needed for severe pain (pain score 7-10).   OXYGEN  Inhale 2 L/min into the lungs continuous.   prednisoLONE  acetate 1 % ophthalmic suspension Commonly known as: PRED FORTE  Place 1 drop into the left eye 4 (four) times daily.   predniSONE  10 MG tablet Commonly known as: DELTASONE  Take 4 pills daily for 2 days then 3 pills daily for 3 days then 2 pills daily for 3 days then continue taking 1 pill daily   rosuvastatin  20 MG tablet Commonly known as: CRESTOR  Take 20 mg by mouth daily.   sertraline  25 MG tablet Commonly  known as: ZOLOFT  Take 25 mg by mouth in the morning.   Ventolin  HFA 108 (90 Base) MCG/ACT inhaler Generic drug: albuterol  Inhale 2 puffs into the lungs every 6 (six) hours as needed for wheezing or shortness of breath.   Voltaren Arthritis Pain 1 % Gel Generic drug: diclofenac  Sodium Apply 1 Application topically as needed (pain).        Discharge Exam: Filed Weights   03/17/24 0650 03/18/24 0500 03/19/24 0500  Weight: 49.5 kg 50.3 kg 48.7 kg   Constitutional: Alert, awake, calm, comfortable HEENT: Neck supple Respiratory: clear to auscultation bilaterally, no wheezing, no crackles. Normal respiratory effort. No accessory muscle use.  Cardiovascular: Regular rate and rhythm, no murmurs / rubs / gallops. No extremity edema. 2+ pedal pulses. No carotid bruits.  Abdomen: no tenderness, no masses palpated. No hepatosplenomegaly. Bowel sounds positive.  Musculoskeletal: no clubbing / cyanosis. No joint deformity upper and lower extremities. Good ROM, no contractures. Normal muscle tone.  Skin: no rashes, lesions, ulcers. No induration Neurologic:  CN 2-12 grossly intact. Sensation intact, DTR normal. Strength 5/5 x all 4 extremities.  Psychiatric: Normal judgment and insight. Alert and oriented x 3. Normal mood.    Condition at discharge: stable  The results of significant diagnostics from this hospitalization (including imaging, microbiology, ancillary and laboratory) are listed below for reference.   Imaging Studies: IR KYPHO EA ADDL LEVEL THORACIC OR LUMBAR Result Date: 03/18/2024 CLINICAL DATA:  Symptomatic lumbar compression deformities. Briefly, 68 year old female with symptomatic acute-to-subacute fractures of L3 and L4 vertebral bodies. Patient with refractory pain and failed conservative management. EXAM: VERTEBRAL AUGMENTATION with BALLOON KYPHOPLASTY of L3 and L4 VERTEBRAL BODIES COMPARISON:  CT L-spine, 04-04-19.  MR L-spine, 03/11/2024. MEDICATIONS: As antibiotic  prophylaxis, Ancef  2 gm IV was ordered pre-procedure and administered intravenously within 1 hour of incision. ANESTHESIA/SEDATION: Moderate (conscious) sedation was employed during this procedure. A total of Versed  6 mg and Fentanyl  300 mcg was administered intravenously. Moderate Sedation Time: 62 minutes. The patient's level of consciousness and vital signs were monitored continuously by radiology nursing throughout the procedure under my direct supervision. FLUOROSCOPY: Radiation Exposure Index and estimated peak skin dose (PSD); Reference air kerma (RAK), 689 mGy. COMPLICATIONS: None immediate. PROCEDURE: Transitional lumbosacral anatomy is noted, with rib-bearing L1 vertebral body. Informed written consent was obtained from the patient and/or patient's representative after a thorough discussion of the procedural risks, benefits and alternatives. All questions were addressed. Maximal Sterile Barrier Technique was utilized including caps, mask, sterile gowns, sterile gloves, sterile drape, hand hygiene and skin antiseptic. A timeout was performed prior to the initiation of the procedure. The procedure, risks (including but not limited to bleeding, infection, organ damage), benefits, and alternatives were explained to the patient. Questions regarding the procedure were encouraged and answered. The patient understands and consents to the procedure. The patient was placed prone on the fluoroscopic table. The skin overlying the lumbosacral junction was then prepped and draped in the usual sterile fashion. Maximal barrier sterile technique was utilized including caps, mask, sterile gowns, sterile gloves, sterile drape, hand hygiene and skin antiseptic. Intravenous Fentanyl  and Versed  were administered as conscious sedation during continuous cardiorespiratory monitoring by the radiology RN. The RIGHT pedicle at L3 was then infiltrated with local anesthetic followed by the advancement of a Kyphon trocar needle through  the RIGHT pedicle into the posterior one-third of the vertebral body. The trocar was removed and a bone biopsy was obtained at this location. Subsequently, the osteo drill was advanced to the anterior third of the vertebral body. The osteo drill was retracted. Through the working cannula, a Kyphon inflatable bone tamp was advanced and positioned with the distal marker approximately 5 mm from the anterior aspect of the cortex. Appropriate positioning was confirmed on the AP projection. At this time, the balloon was expanded using contrast via a Kyphon inflation syringe device via micro tubing. In similar fashion, the LEFT L3 pedicle was infiltrated with local anesthetic followed by the advancement of a second Kyphon trocar needle through the LEFT pedicle into the posterior third of the vertebral body. Subsequently, the osteo drill was coaxially advanced to the anterior third. The osteo drill was exchanged for a Kyphon inflatable bone tamp, advanced to the 5 mm of the anterior aspect of the cortex. The balloon was then expanded using contrast. Attention was then directed to L4 vertebral body, in similar fashion, the RIGHT and LEFT pedicles were infiltrated with local anesthetic followed by the advancement of Kyphon trocar needles through the pedicles and into the posterior  third of the vertebral body. Subsequently, the osteo drill was coaxially advanced to the anterior third. The osteo drill was exchanged for a Kyphon inflatable bone tamps, advanced to the 5 mm of the anterior aspect of the cortex. The balloon was then expanded using contrast. Inflations were continued until there was near apposition with the superior end plate. At this time, methylmethacrylate mixture was reconstituted in the Kyphon bone mixing device system. This was then loaded into the delivery mechanism, attached to Kyphon bone fillers. The balloons were deflated and removed followed by the instillation of methylmethacrylate mixture with  excellent filling in the AP and lateral projections. No extravasation was noted in the disk spaces or posteriorly into the spinal canal, nor epidural venous contamination was seen. The working cannulae and the bone filler were then retrieved and removed. Hemostasis was achieved with manual compression. The patient tolerated the procedure well without immediate postprocedural complication. FINDINGS: *Transitional lumbosacral anatomy is noted, with rib-bearing L1 vertebral body. *The patient has suffered fractures of the L3 on L4. It is recommended that patients aged 8 years or older be evaluated for possible testing or treatment of osteoporosis. A copy of this procedure report is sent to the patient's referring physician *Completion images demonstrate a technically excellent result with adequate cement filling of the L3 on L4 vertebral bodies on both the AP and lateral projections. A total of 9 mL of PMMA was used between the levels. *No extravasation was noted in the disk spaces or posteriorly into the spinal canal, nor epidural venous contamination was seen. IMPRESSION: Successful L3 and L4 vertebral body augmentation with balloon kyphoplasty, for symptomatic lumbar spinal fractures. Per CMS PQRS reporting requirements (PQRS Measure 24): Given the patient's age of greater than 50 and the fracture site (hip, distal radius, or spine), the patient should be tested for osteoporosis using DXA, and the appropriate treatment considered based on the DXA results. Thom Hall, MD [facility] Radiology Electronically Signed   By: Thom Hall M.D.   On: 03/18/2024 15:18   IR KYPHO EA ADDL LEVEL THORACIC OR LUMBAR Result Date: 03/18/2024 CLINICAL DATA:  Symptomatic lumbar compression deformities. Briefly, 68 year old female with symptomatic acute-to-subacute fractures of L3 and L4 vertebral bodies. Patient with refractory pain and failed conservative management. EXAM:  VERTEBRAL AUGMENTATION with BALLOON KYPHOPLASTY of L3 and L4 VERTEBRAL BODIES COMPARISON:  CT L-spine, 15-Mar-2019.  MR L-spine, 03/11/2024. MEDICATIONS: As antibiotic prophylaxis, Ancef  2 gm IV was ordered pre-procedure and administered intravenously within 1 hour of incision. ANESTHESIA/SEDATION: Moderate (conscious) sedation was employed during this procedure. A total of Versed  6 mg and Fentanyl  300 mcg was administered intravenously. Moderate Sedation Time: 62 minutes. The patient's level of consciousness and vital signs were monitored continuously by radiology nursing throughout the procedure under my direct supervision. FLUOROSCOPY: Radiation Exposure Index and estimated peak skin dose (PSD); Reference air kerma (RAK), 689 mGy. COMPLICATIONS: None immediate. PROCEDURE: Transitional lumbosacral anatomy is noted, with rib-bearing L1 vertebral body. Informed written consent was obtained from the patient and/or patient's representative after a thorough discussion of the procedural risks, benefits and alternatives. All questions were addressed. Maximal Sterile Barrier Technique was utilized including caps, mask, sterile gowns, sterile gloves, sterile drape, hand hygiene and skin antiseptic. A timeout was performed prior to the initiation of the procedure. The procedure, risks (including but not limited to bleeding, infection, organ damage), benefits, and alternatives were explained to the patient. Questions regarding the procedure were encouraged and answered. The patient understands and  consents to the procedure. The patient was placed prone on the fluoroscopic table. The skin overlying the lumbosacral junction was then prepped and draped in the usual sterile fashion. Maximal barrier sterile technique was utilized including caps, mask, sterile gowns, sterile gloves, sterile drape, hand hygiene and skin antiseptic. Intravenous Fentanyl  and Versed  were administered as conscious sedation during continuous  cardiorespiratory monitoring by the radiology RN. The RIGHT pedicle at L3 was then infiltrated with local anesthetic followed by the advancement of a Kyphon trocar needle through the RIGHT pedicle into the posterior one-third of the vertebral body. The trocar was removed and a bone biopsy was obtained at this location. Subsequently, the osteo drill was advanced to the anterior third of the vertebral body. The osteo drill was retracted. Through the working cannula, a Kyphon inflatable bone tamp was advanced and positioned with the distal marker approximately 5 mm from the anterior aspect of the cortex. Appropriate positioning was confirmed on the AP projection. At this time, the balloon was expanded using contrast via a Kyphon inflation syringe device via micro tubing. In similar fashion, the LEFT L3 pedicle was infiltrated with local anesthetic followed by the advancement of a second Kyphon trocar needle through the LEFT pedicle into the posterior third of the vertebral body. Subsequently, the osteo drill was coaxially advanced to the anterior third. The osteo drill was exchanged for a Kyphon inflatable bone tamp, advanced to the 5 mm of the anterior aspect of the cortex. The balloon was then expanded using contrast. Attention was then directed to L4 vertebral body, in similar fashion, the RIGHT and LEFT pedicles were infiltrated with local anesthetic followed by the advancement of Kyphon trocar needles through the pedicles and into the posterior third of the vertebral body. Subsequently, the osteo drill was coaxially advanced to the anterior third. The osteo drill was exchanged for a Kyphon inflatable bone tamps, advanced to the 5 mm of the anterior aspect of the cortex. The balloon was then expanded using contrast. Inflations were continued until there was near apposition with the superior end plate. At this time, methylmethacrylate mixture was reconstituted in the Kyphon bone mixing device system. This was then  loaded into the delivery mechanism, attached to Kyphon bone fillers. The balloons were deflated and removed followed by the instillation of methylmethacrylate mixture with excellent filling in the AP and lateral projections. No extravasation was noted in the disk spaces or posteriorly into the spinal canal, nor epidural venous contamination was seen. The working cannulae and the bone filler were then retrieved and removed. Hemostasis was achieved with manual compression. The patient tolerated the procedure well without immediate postprocedural complication. FINDINGS: *Transitional lumbosacral anatomy is noted, with rib-bearing L1 vertebral body. *The patient has suffered fractures of the L3 on L4. It is recommended that patients aged 65 years or older be evaluated for possible testing or treatment of osteoporosis. A copy of this procedure report is sent to the patient's referring physician *Completion images demonstrate a technically excellent result with adequate cement filling of the L3 on L4 vertebral bodies on both the AP and lateral projections. A total of 9 mL of PMMA was used between the levels. *No extravasation was noted in the disk spaces or posteriorly into the spinal canal, nor epidural venous contamination was seen. IMPRESSION: Successful L3 and L4 vertebral body augmentation with balloon kyphoplasty, for symptomatic lumbar spinal fractures. Per CMS PQRS reporting requirements (PQRS Measure 24): Given the patient's age of greater than 50 and the fracture site (hip, distal  radius, or spine), the patient should be tested for osteoporosis using DXA, and the appropriate treatment considered based on the DXA results. Thom Hall, MD [facility] Radiology Electronically Signed   By: Thom Hall M.D.   On: 03/18/2024 15:18   MR LUMBAR SPINE WO CONTRAST Result Date: 03/11/2024 CLINICAL DATA:  Lumbar compression fracture. EXAM: MRI LUMBAR SPINE WITHOUT CONTRAST  TECHNIQUE: Multiplanar, multisequence MR imaging of the lumbar spine was performed. No intravenous contrast was administered. COMPARISON:  CT lumbar spine dated March 09, 2024. FINDINGS: Segmentation: There is transitional anatomy present. In order to maintain consistency with the prior reporting, the last rib-bearing vertebral body will be considered L1 and the first sacralized vertebral body is considered S1. Alignment:  Grade 2 anterolisthesis at L5-S1. Vertebrae: There is downward bowing of the superior endplate of L4, which has lost about 30% of its height centrally. There is bone marrow edema within the superior vertebral body. There is also mild downward bowing deformity of the superior endplate of L3, which also demonstrates mild edema along the endplate. The vertebral body has lost about 15% of its height centrally. The L1, L2 and L5 vertebrae are intact and normal in signal intensity. Conus medullaris and cauda equina: Conus extends to the L1-2 level. Conus and cauda equina appear normal. Paraspinal and other soft tissues: Unremarkable. Disc levels: Very mild disc bulging at L2-3 and L3-4, without significant spinal canal or neural foraminal stenosis. There is grade 2 anterolisthesis at L5-S1, with diffuse pseudo disc bulging. There is mild central spinal canal stenosis and mild-to-moderate bilateral neural foraminal stenosis, worse on the left. No definite nerve root impingement. There is a primordial disc space at S1-2. IMPRESSION: 1. Mild compression fractures of L3 and L4, associated with mild bone marrow edema, more pronounced at L4, suggesting an acute on chronic fracture of L4 and an acute/subacute fracture of L3, which is mildly edematous. 2. Grade 2 anterolisthesis at L5-S1 with moderate bilateral neural foraminal stenosis, but no definite nerve root impingement. 3. Transitional anatomy. Electronically Signed   By: Evalene Coho M.D.   On: 03/11/2024 17:09   CT Lumbar Spine Wo Contrast Result  Date: 03/10/2024 CLINICAL DATA:  Low back pain, increased fracture risk. EXAM: CT LUMBAR SPINE WITHOUT CONTRAST TECHNIQUE: Multidetector CT imaging of the lumbar spine was performed without intravenous contrast administration. Multiplanar CT image reconstructions were also generated. RADIATION DOSE REDUCTION: This exam was performed according to the departmental dose-optimization program which includes automated exposure control, adjustment of the mA and/or kV according to patient size and/or use of iterative reconstruction technique. COMPARISON:  None Available. FINDINGS: Segmentation: Transitional lumbosacral anatomy. Two be consistent with the prior numbering system, there is a transitional vertebral body that is considered to be S1. Alignment: Grade II anterolisthesis of L5 on S1. Vertebrae: Similar L4 superior endplate fracture with approximately 25% height loss. Acute L3 superior endplate fracture with 25% height loss and trace bony retropulsion, new since June 9th. Osteopenia. Paraspinal and other soft tissues: Negative. Disc levels: Similar lower lumbar facet arthropathy. Disc bulge at L5-S1 with at least mild canal stenosis. IMPRESSION: 1. Acute L3 superior endplate fracture with 25% height loss and trace bony retropulsion, new since June 9th. 2. Similar L4 superior endplate fracture with 25% height loss. 3. Similar grade II anterolisthesis of L5 on S1. 4. Transitional lumbosacral anatomy. 5. Osteopenia. Electronically Signed   By: Gilmore GORMAN Molt M.D.   On: 03/10/2024 00:42   DG Chest Portable 1 View  Result Date: 03/09/2024 CLINICAL DATA:  Shortness of breath.  History of lung cancer. EXAM: PORTABLE CHEST 1 VIEW COMPARISON:  03/06/2024 FINDINGS: Postsurgical volume loss in the left hemithorax. Stable left apical pleuroparenchymal opacity. The heart is normal in size. Emphysema and bronchial thickening, unchanged from prior. Unchanged blunting of left costophrenic angle. No large effusion. No  pneumothorax. On limited assessment, no acute osseous finding. IMPRESSION: 1. No acute findings. 2. Postsurgical volume loss in the left hemithorax. Stable left apical pleuroparenchymal opacity. 3. Emphysema and bronchial thickening. Electronically Signed   By: Andrea Gasman M.D.   On: 03/09/2024 20:08   DG CHEST PORT 1 VIEW Result Date: 03/06/2024 CLINICAL DATA:  [id number] SOB (shortness of breath) [id number] EXAM: PORTABLE CHEST - 1 VIEW COMPARISON:  March 02, 2024 FINDINGS: Chronic apical scarring in the left upper lung zone. Underlying emphysema. No new airspace consolidation, pleural effusion, or pneumothorax. No cardiomegaly. Surgical clips in the left hilum. Aortic atherosclerosis. No acute fracture or destructive lesions. Multilevel thoracic osteophytosis. IMPRESSION: Emphysema.  No acute cardiopulmonary abnormality. Electronically Signed   By: Rogelia Myers M.D.   On: 03/06/2024 12:53   DG Chest 2 View Result Date: 03/02/2024 CLINICAL DATA:  Increased shortness of breath EXAM: CHEST - 2 VIEW COMPARISON:  02/16/2024 FINDINGS: Cardiac shadow is stable. Postsurgical changes are noted in the left lung with apical scarring. No acute infiltrate or effusion is seen. No bony abnormality is noted IMPRESSION: Chronic changes in the left apex.  No acute abnormality noted. Electronically Signed   By: Oneil Devonshire M.D.   On: 03/02/2024 21:10    Microbiology: Results for orders placed or performed during the hospital encounter of 03/02/24  Resp panel by RT-PCR (RSV, Flu A&B, Covid) Anterior Nasal Swab     Status: None   Collection Time: 03/02/24  8:43 PM   Specimen: Anterior Nasal Swab  Result Value Ref Range Status   SARS Coronavirus 2 by RT PCR NEGATIVE NEGATIVE Final    Comment: (NOTE) SARS-CoV-2 target nucleic acids are NOT DETECTED.  The SARS-CoV-2 RNA is generally detectable in upper respiratory specimens during the acute phase of infection. The lowest concentration of SARS-CoV-2 viral copies this  assay can detect is 138 copies/mL. A negative result does not preclude SARS-Cov-2 infection and should not be used as the sole basis for treatment or other patient management decisions. A negative result may occur with  improper specimen collection/handling, submission of specimen other than nasopharyngeal swab, presence of viral mutation(s) within the areas targeted by this assay, and inadequate number of viral copies(<138 copies/mL). A negative result must be combined with clinical observations, patient history, and epidemiological information. The expected result is Negative.  Fact Sheet for Patients:  BloggerCourse.com  Fact Sheet for Healthcare Providers:  SeriousBroker.it  This test is no t yet approved or cleared by the [place]  FDA and  has been authorized for detection and/or diagnosis of SARS-CoV-2 by FDA under an Emergency Use Authorization (EUA). This EUA will remain  in effect (meaning this test can be used) for the duration of the COVID-19 declaration under Section 564(b)(1) of the Act, 21 U.S.C.section 360bbb-3(b)(1), unless the authorization is terminated  or revoked sooner.       Influenza A by PCR NEGATIVE NEGATIVE Final   Influenza B by PCR NEGATIVE NEGATIVE Final    Comment: (NOTE) The Xpert Xpress SARS-CoV-2/FLU/RSV plus assay is intended as an aid in the diagnosis of influenza from Nasopharyngeal swab specimens and should not be used as a  sole basis for treatment. Nasal washings and aspirates are unacceptable for Xpert Xpress SARS-CoV-2/FLU/RSV testing.  Fact Sheet for Patients: BloggerCourse.com  Fact Sheet for Healthcare Providers: SeriousBroker.it  This test is not yet approved or cleared by the [place]  FDA and has been authorized for detection and/or diagnosis of SARS-CoV-2 by FDA under an Emergency Use Authorization (EUA). This EUA will  remain in effect (meaning this test can be used) for the duration of the COVID-19 declaration under Section 564(b)(1) of the Act, 21 U.S.C. section 360bbb-3(b)(1), unless the authorization is terminated or revoked.     Resp Syncytial Virus by PCR NEGATIVE NEGATIVE Final    Comment: (NOTE) Fact Sheet for Patients: BloggerCourse.com  Fact Sheet for Healthcare Providers: SeriousBroker.it  This test is not yet approved or cleared by the [place]  FDA and has been authorized for detection and/or diagnosis of SARS-CoV-2 by FDA under an Emergency Use Authorization (EUA). This EUA will remain in effect (meaning this test can be used) for the duration of the COVID-19 declaration under Section 564(b)(1) of the Act, 21 U.S.C. section 360bbb-3(b)(1), unless the authorization is terminated or revoked.  Performed at [facility], [address] [place], [place] [address]   Expectorated Sputum Assessment w Gram Stain, Rflx to Resp Cult     Status: None   Collection Time: 03/03/24  2:42 AM   Specimen: Sputum  Result Value Ref Range Status   Specimen Description SPUTUM  Final   Special Requests NONE  Final   Sputum evaluation   Final    THIS SPECIMEN IS ACCEPTABLE FOR SPUTUM CULTURE Performed at [facility], [address], [place], [place] [address]    Report Status 03/03/2024 FINAL  Final  Culture, Respiratory w Gram Stain     Status: None   Collection Time: 03/03/24  2:42 AM   Specimen: SPU  Result Value Ref Range Status   Specimen Description   Final    SPUTUM Performed at [facility], [address] [place], [place] [address]    Special Requests   Final    NONE Reflexed from [id number] Performed at [facility], [address], [place], [place] [address]    Gram Stain   Final    FEW WBC PRESENT,BOTH PMN AND MONONUCLEAR ABUNDANT SQUAMOUS EPITHELIAL  CELLS PRESENT MODERATE GRAM POSITIVE RODS MODERATE GRAM POSITIVE COCCI IN CLUSTERS FEW GRAM NEGATIVE RODS Performed at [facility], [address], [place], [place] [address]    Culture FEW PSEUDOMONAS AERUGINOSA  Final   Report Status 03/06/2024 FINAL  Final   Organism ID, Bacteria PSEUDOMONAS AERUGINOSA  Final      Susceptibility   Pseudomonas aeruginosa - MIC*    CEFTAZIDIME <=1 SENSITIVE Sensitive     CIPROFLOXACIN <=0.25 SENSITIVE Sensitive     GENTAMICIN 8 INTERMEDIATE Intermediate     IMIPENEM 1 SENSITIVE Sensitive     PIP/TAZO <=4 SENSITIVE Sensitive ug/mL    CEFEPIME  4 SENSITIVE Sensitive     * FEW PSEUDOMONAS AERUGINOSA    Labs: CBC: Recent Labs  Lab 03/16/24 0005 03/17/24 0316 03/18/24 0319 03/19/24 0316  WBC 13.9* 14.2* 15.2* 12.3*  NEUTROABS 10.0*  --   --   --   HGB 10.4* 10.7* 10.9* 10.3*  HCT 33.6* 34.7* 35.5* 34.7*  MCV 94.4 95.1 95.4 96.1  PLT 209 216 200 181   Basic Metabolic Panel: Recent Labs  Lab 03/17/24 0316 03/17/24 1753 03/18/24 0319 03/19/24 0316  NA  139 139  --  138  K 3.6 4.1  --  3.9  CL 100 98  --  97*  CO2 30 32  --  33*  GLUCOSE 107* 101*  --  115*  BUN 21 12  --  23  CREATININE 0.37* 0.39*  --  0.68  CALCIUM  8.7* 8.8*  --  8.6*  MG  --   --  2.0  --    Liver Function Tests: Recent Labs  Lab 03/17/24 1753  AST 22  ALT 26  ALKPHOS 107  BILITOT 0.5  PROT 6.4*  ALBUMIN 3.4*    Discharge time spent: greater than 30 minutes.  Signed: Nena Rebel, MD [facility] 03/19/2024

## 2024-03-19 NOTE — Progress Notes (Signed)
  [facility] Patient Visit   Tracy Park is a current [facility] hospice patient with a terminal diagnosis of chronic obstructive pulmonary disease with acute exacerbation. Patient discharged home Monday afternoon with worsening back pain which only intensified once home. She made the decision to come to the ED for further evaluation and was found to have an acute compression fracture of her lumbar region. Per Dr. Norleen Laurence with [facility] this is a related hospital admission.    Patient was planning on discharge home today. Discussed discharge with daughter, Odella, who reports that she had the understanding that patient would pursue STR. She is aware we would revoke hospice services while patient is receiving Rehab services. Relayed information to [facility], who will assist with PT reevaluating for physical therapy recommendations. Plan will likely be for STR vs Home Health PT.    She remains inpatient appropriate for pain management including recent kyphoplasty and IV medications for pain. Also for PT evaluation and recommendation for post discharge Physical Therapy.   Vital Signs: 98.2/72/16   130/77   O2 97% on 2L [place]   I&O: 600/1100   Abnormal labs: Chl 97, CO2 33, Ca 8.6, WBC 12.3, Hgb 10.3   Diagnostics:  none new   IV/PRN Meds: Flexeril  10mg  po x1, oxycodone  10mg  po x 3, Morphine  4mg  IV x 1   Problem List from note 7.11 Assessment and Plan:    L3/L4 compression fracture, s/p kyphoplasty by IR 7/9 .  Further plans per hospice -Continue scheduled steroids, lidocaine  patch, Flexeril , oxycodone    COPD exacerbation - Stable, resume home oxygen  and home medications   Chronic respiratory failure with hypoxia Patient is on 2 L/min of oxygen  which is her baseline.   Primary hypertension - Continue losartan , metoprolol  succinate   Hypothyroidism - Continue Synthroid    History of lung cancer Patient has a history of left upper lobe  lobectomy.  Patient is hospice, DNR and wants to resume that.   Paroxysmal atrial fibrillation - Resume home medication metoprolol  and Eliquis , further plans per hospice   Hyperlipidemia - Continue Crestor    Depression - Continue Zoloft    Discharge Planning: Undetermined. Patient and daughter are wanting to see if STR placement or home health PT is an option.   Family Contact: Spoke with Odella, daughter by phone   IDT: Updated   Goals of Care: Limited Code   Should patient need ambulance transfer at discharge- please use [facility]) as they contract this service for our active hospice patients.    Elouise Husband, BSN, RN, [facility] Liaison [phone]

## 2024-03-19 NOTE — Progress Notes (Signed)
 Physical Therapy Treatment Patient Details Name: Tracy Park MRN: [id number] DOB: 12/28/1955 Today's Date: 03/19/2024   History of Present Illness 68 year old female re-admitted to [facility] on 03/09/24 after beginning home hospice care for pain control as well as PT/OT in the setting of acute L3 compression fracture after presenting with acute worsening of her low back discomfort when attempting to sit down.  Pt s/p L3/4 kyphoplasties on 03/17/24.  Previous recent admission on 03/02/24 for Acute COPD exacerbation.  Past medical history of hypertension, depression, anxiety, paroxysmal A-fib on Eliquis , lung cancer status post lumpectomy, severe COPD with multiple admissions now enrolled with outpatient hospice, chronic hypoxic/hypercarbic respiratory failure.    PT Comments  Pt assisted with OOB and assist for LSO (applied off certain by pt).  Pt then attempted to ambulate however distance limited today due to increased pain.  Pt reports she does not have assist at home and now agreeable for SNF for rehab prior to returning home. D/C recommendations updated since patient would benefit from continued inpatient follow up therapy, <3 hours/day.     If plan is discharge home, recommend the following: A little help with walking and/or transfers;A little help with bathing/dressing/bathroom;Assistance with cooking/housework;Assist for transportation;Help with stairs or ramp for entrance   Can travel by private vehicle        Equipment Recommendations  None recommended by PT    Recommendations for Other Services       Precautions / Restrictions Precautions Precautions: Fall;Back Precaution/Restrictions Comments: 2L O2 [place] baseline Required Braces or Orthoses: Spinal Brace Spinal Brace: Lumbar corset;Applied in sitting position     Mobility  Bed Mobility Overal bed mobility: Needs Assistance Bed Mobility: Rolling, Sidelying to Sit Rolling: Supervision, Used rails Sidelying to sit: Supervision, HOB  elevated, Used rails       General bed mobility comments: min cues for body mechanics    Transfers Overall transfer level: Needs assistance Equipment used: Rolling walker (2 wheels) Transfers: Sit to/from Stand Sit to Stand: Contact guard assist   Step pivot transfers: Contact guard assist       General transfer comment: CGA for safety, pt quickly up and without brace due to wet bed linen; transferred to recliner to don LSO prior to ambulating    Ambulation/Gait Ambulation/Gait assistance: Min assist Gait Distance (Feet): 10 Feet Assistive device: Rolling walker (2 wheels) Gait Pattern/deviations: Step-through pattern, Decreased stride length, Narrow base of support Gait velocity: decr     General Gait Details: shaky UEs on RW, pt reports increased pain (RN notified); pt limited by pain today and provided recliner; maintained pt's baseline 2L O2 [place]   Stairs             Wheelchair Mobility     Tilt Bed    Modified Rankin (Stroke Patients Only)       Balance                                            Communication Communication Communication: No apparent difficulties  Cognition Arousal: Alert Behavior During Therapy: Flat affect   PT - Cognitive impairments: No apparent impairments                         Following commands: Intact      Cueing Cueing Techniques: Verbal cues  Exercises      General Comments  Pertinent Vitals/Pain Pain Assessment Pain Assessment: 0-10 Pain Score: 10-Worst pain ever Pain Location: back Pain Descriptors / Indicators: Discomfort, Grimacing, Guarding, Sore, Tender Pain Intervention(s): Repositioned, Monitored during session, Patient requesting pain meds-RN notified    Home Living                          Prior Function            PT Goals (current goals can now be found in the care plan section) Progress towards PT goals: Progressing toward goals     Frequency    Min 3X/week      PT Plan      Co-evaluation              AM-PAC PT 6 Clicks Mobility   Outcome Measure  Help needed turning from your back to your side while in a flat bed without using bedrails?: A Little Help needed moving from lying on your back to sitting on the side of a flat bed without using bedrails?: A Little Help needed moving to and from a bed to a chair (including a wheelchair)?: A Little Help needed standing up from a chair using your arms (e.g., wheelchair or bedside chair)?: A Little Help needed to walk in hospital room?: A Lot Help needed climbing 3-5 steps with a railing? : A Lot 6 Click Score: 16    End of Session Equipment Utilized During Treatment: Gait belt;Oxygen  Activity Tolerance: Patient limited by fatigue;Patient limited by pain Patient left: in chair;with call bell/phone within reach;with chair alarm set Nurse Communication: Mobility status;Patient requests pain meds PT Visit Diagnosis: Difficulty in walking, not elsewhere classified (R26.2)     Time: 8467-8447 PT Time Calculation (min) (ACUTE ONLY): 20 min  Charges:    $Gait Training: 8-22 mins PT General Charges $$ ACUTE PT VISIT: 1 Visit                    Tari KLEIN, DPT Physical Therapist Acute Rehabilitation Services Office: [phone]   Kati L Payson 03/19/2024, 4:20 PM

## 2024-03-19 NOTE — Progress Notes (Signed)
 Occupational Therapy Treatment Patient Details Name: Tracy Park MRN: [id number] DOB: 1956/09/05 Today's Date: 03/19/2024   History of present illness 68 year old female re-admitted to [facility] on 03/09/24 after beginning home hospice care for pain control as well as PT/OT in the setting of acute L3 compression fracture after presenting with acute worsening of her low back discomfort when attempting to sit down.  Pt s/p L3/4 kyphoplasties on 03/17/24.  Previous recent admission on 03/02/24 for Acute COPD exacerbation.  Past medical history of hypertension, depression, anxiety, paroxysmal A-fib on Eliquis , lung cancer status post lumpectomy, severe COPD with multiple admissions now enrolled with outpatient hospice, chronic hypoxic/hypercarbic respiratory failure.   OT comments  Patient seen for skilled OT session this am. Patient open for all activity despite ongoing pain and limited activity tolerance. Educated and training this session with LSO brace and body mechanics during functional mobility and BADL's.Improved overall standing tolerance as well as functional reach to lower body for garment management following back precautions.  OT will continue to progress function while in Acute setting and d/c plan remains appropriate.       If plan is discharge home, recommend the following:  A little help with walking and/or transfers;A little help with bathing/dressing/bathroom;Assistance with cooking/housework;Assist for transportation   Equipment Recommendations  None recommended by OT       Precautions / Restrictions Precautions Precautions: Fall;Back Recall of Precautions/Restrictions: Intact Precaution/Restrictions Comments: 2L O2 [place] baseline Required Braces or Orthoses: Spinal Brace Spinal Brace: Lumbar corset;Applied in sitting position Restrictions Weight Bearing Restrictions Per Provider Order: No Other Position/Activity Restrictions: LSO instruction       Mobility Bed Mobility Overal  bed mobility: Needs Assistance Bed Mobility: Rolling, Sidelying to Sit Rolling: Supervision, Used rails Sidelying to sit: Supervision, HOB elevated, Used rails   Sit to supine: Contact guard assist   General bed mobility comments: min cues for body mechanics    Transfers Overall transfer level: Needs assistance Equipment used: Rolling walker (2 wheels) Transfers: Sit to/from Stand, Bed to chair/wheelchair/BSC Sit to Stand: Contact guard assist     Step pivot transfers: Contact guard assist     General transfer comment: min cues for forward and back stepping as well as RW ambulation in room     Balance Overall balance assessment: Mild deficits observed, not formally tested                                         ADL either performed or assessed with clinical judgement   ADL Overall ADL's : Needs assistance/impaired     Grooming: Wash/dry hands;Wash/dry face;Oral care;Sitting;Set up   Upper Body Bathing: Set up;Sitting   Lower Body Bathing: Contact guard assist;Sit to/from stand Lower Body Bathing Details (indicate cue type and reason): cues for back precautions Upper Body Dressing : Cueing for safety;Set up;Supervision/safety Upper Body Dressing Details (indicate cue type and reason): LSO donning with min cues only Lower Body Dressing: Set up;Sitting/lateral leans Lower Body Dressing Details (indicate cue type and reason): figure 4 without difficulty Toilet Transfer: Contact guard assist;Ambulation;Rolling walker (2 wheels) Toilet Transfer Details (indicate cue type and reason): improved tolerance despite back pain this session Toileting- Clothing Manipulation and Hygiene: Set up;Sitting/lateral lean       Functional mobility during ADLs: Contact guard assist;Rolling walker (2 wheels) General ADL Comments: improved back precaution integration    Extremity/Trunk Assessment Upper Extremity Assessment Upper Extremity  Assessment: Overall WFL for tasks  assessed   Lower Extremity Assessment Lower Extremity Assessment: Defer to PT evaluation        Vision   Vision Assessment?: No apparent visual deficits         Communication Communication Communication: No apparent difficulties   Cognition Arousal: Alert Behavior During Therapy: Flat affect Cognition: No apparent impairments                               Following commands: Intact        Cueing   Cueing Techniques: Verbal cues        General Comments back incision covered, min cues for paced breathing with no SOB this session    Pertinent Vitals/ Pain       Pain Assessment Pain Assessment: 0-10 Pain Score: 7  Pain Location: back Pain Descriptors / Indicators: Discomfort, Grimacing, Guarding, Sore, Tender Pain Intervention(s): Monitored during session, Premedicated before session, Repositioned   Frequency  Min 2X/week        Progress Toward Goals  OT Goals(current goals can now be found in the care plan section)  Progress towards OT goals: Progressing toward goals  Acute Rehab OT Goals Patient Stated Goal: to keep moving Time For Goal Achievement: 03/24/24 Potential to Achieve Goals: Fair ADL Goals Pt Will Perform Grooming: with supervision;standing Pt Will Transfer to Toilet: ambulating;with supervision Pt Will Perform Toileting - Clothing Manipulation and hygiene: sitting/lateral leans;sit to/from stand Additional ADL Goal #1: Pt will verbalize and follow 3/3 back precautions without cues during ADLs and mobility to ensure proper healing of lumbar fracture. Additional ADL Goal #2: Pt will correctly don and doff LSO brace with setup assistance only.  Plan         AM-PAC OT 6 Clicks Daily Activity     Outcome Measure   Help from another person eating meals?: None Help from another person taking care of personal grooming?: A Little Help from another person toileting, which includes using toliet, bedpan, or urinal?: A Little Help  from another person bathing (including washing, rinsing, drying)?: A Little Help from another person to put on and taking off regular upper body clothing?: A Little Help from another person to put on and taking off regular lower body clothing?: A Little 6 Click Score: 19    End of Session Equipment Utilized During Treatment: Gait belt;Rolling walker (2 wheels);Other (comment);Oxygen  (LSO)  OT Visit Diagnosis: Unsteadiness on feet (R26.81);Pain   Activity Tolerance Patient tolerated treatment well   Patient Left in bed;with call bell/phone within reach   Nurse Communication Mobility status        Time: 1010-1040 OT Time Calculation (min): 30 min  Charges: OT General Charges $OT Visit: 1 Visit OT Treatments $Self Care/Home Management : 23-37 mins  Estefany Goebel OT/L Acute Rehabilitation Department  [phone]  03/19/2024, 12:15 PM

## 2024-03-20 LAB — CBC
HCT: 32.7 % — ABNORMAL LOW (ref 36.0–46.0)
Hemoglobin: 10.1 g/dL — ABNORMAL LOW (ref 12.0–15.0)
MCH: 29.2 pg (ref 26.0–34.0)
MCHC: 30.9 g/dL (ref 30.0–36.0)
MCV: 94.5 fL (ref 80.0–100.0)
Platelets: 184 K/uL (ref 150–400)
RBC: 3.46 MIL/uL — ABNORMAL LOW (ref 3.87–5.11)
RDW: 14.6 % (ref 11.5–15.5)
WBC: 10.5 K/uL (ref 4.0–10.5)
nRBC: 0 % (ref 0.0–0.2)

## 2024-03-20 NOTE — Progress Notes (Signed)
 Patient seen and examined at bedside.  Still complains of pain on the back.      03/20/2024   10:03 AM 03/20/2024    5:19 AM 03/19/2024    9:07 PM  Vitals with BMI  Weight  106 lbs 11 oz   BMI  18.91   Systolic 114 130 878  Diastolic 78 91 78  Pulse 84 77 84    Tenderness noted on the back.  Noted the discharge summary from yesterday.  Patient wishes to go to skilled nursing facility level of care.  TOC has been consulted at this time.

## 2024-03-20 NOTE — Plan of Care (Signed)

## 2024-03-20 NOTE — TOC Progression Note (Signed)
 Transition of Care [facility]) - Progression Note    Patient Details  Name: Tracy Park MRN: [id number] Date of Birth: April 03, 1956  Transition of Care [facility]) CM/SW Contact  Sheri ONEIDA [place], [place] Phone Number: 03/20/2024, 1:20 PM  Clinical Narrative:    Pt now recommended for SNF. Pt and dtr accepting of new PT rec. Level 2 PASRR pending. PASRR note pending MD signature, need to upload documents to NCMust once signed. FL2 completed and SNF referral faxed, awaiting bed offers.    Expected Discharge Plan: Home w Hospice Care Barriers to Discharge: Continued Medical Work up  Expected Discharge Plan and Services   Discharge Planning Services: NA Post Acute Care Choice: Hospice Living arrangements for the past 2 months: Single Family Home Expected Discharge Date: 03/19/24               DME Arranged: N/A DME Agency: NA       HH Arranged: NA HH Agency: NA         Social Determinants of Health (SDOH) Interventions SDOH Screenings   Food Insecurity: No Food Insecurity (03/10/2024)  Recent Concern: Food Insecurity - Food Insecurity Present (03/03/2024)  Housing: Low Risk  (03/10/2024)  Transportation Needs: No Transportation Needs (03/10/2024)  Utilities: Not At Risk (03/10/2024)  Financial Resource Strain: High Risk (10/08/2023)   Received from [facility]  Physical Activity: Unknown (04/07/2023)   Received from [facility]  Social Connections: Socially Isolated (03/10/2024)  Stress: Stress Concern Present (04/07/2023)   Received from [facility]  Tobacco Use: High Risk (03/13/2024)    Readmission Risk Interventions    03/13/2024    3:54 PM 03/04/2024    2:47 PM 01/14/2024    2:11 PM  Readmission Risk Prevention Plan  Transportation Screening Complete Complete Complete  Medication Review Oceanographer) Complete Complete Complete  PCP or Specialist appointment within 3-5 days of discharge Complete Complete Complete  HRI or Home Care Consult Complete Complete --  SW Recovery  Care/Counseling Consult Complete Complete --  Palliative Care Screening Complete Not Applicable Not Applicable  Skilled Nursing Facility Not Applicable Not Applicable Not Applicable

## 2024-03-20 NOTE — TOC PASRR Note (Signed)
 30 Day PASRR Note   Patient Details  Name: Tracy Park Date of Birth: July 31, 1956   Transition of Care [facility]) CM/SW Contact:    Sheri ONEIDA Sharps, LCSW Phone Number: 03/20/2024, 1:15 PM  To Whom It May Concern:  Please be advised that this patient will require a short-term nursing home stay - anticipated 30 days or less for rehabilitation and strengthening.   The plan is for return home.

## 2024-03-20 NOTE — Progress Notes (Signed)
 [facility] 1325 [facility] Patient Visit   Tracy Park is a current [facility] hospice patient with a terminal diagnosis of chronic obstructive pulmonary disease with acute exacerbation. Patient discharged home Monday afternoon with worsening back pain which only intensified once home. She made the decision to come to the ED for further evaluation and was found to have an acute compression fracture of her lumbar region. Per Dr. Norleen Laurence with [facility] this is a related hospital admission.    Visited patient at bedside. She was resting with eyes closed. Did not appear to be in any acute distress or pain. Spoke with bedside nurse who states that's plan is still to go to rehab. TOC working on submitting patient information for bed bed availability. Patient worked with PT on yesterday and SNF was recommended.    She remains inpatient appropriate for pain management including recent kyphoplasty and IV medications for pain.   Vital Signs: 98.7/83/17   112/71   O2 96% on 2L [name]   I&O: 240/400   Abnormal labs:  03/20/24 03:43 RBC: 3.46 (L) Hemoglobin: 10.1 (L) HCT: 32.7 (L)    Diagnostics:  none new   IV/PRN Meds: Flexeril  10mg  po x1, oxycodone  10mg  po x 2   Problem List from note 7.12.25 Pokhrel, Vernal, MD    Patient seen and examined at bedside.  Still complains of pain on the back.        03/20/2024   10:03 AM 03/20/2024    5:19 AM 03/19/2024    9:07 PM  Vitals with BMI  Weight   106 lbs 11 oz    BMI   18.91    Systolic 114 130 878  Diastolic 78 91 78  Pulse 84 77 84    Tenderness noted on the back.  Noted the discharge summary from yesterday.  Patient wishes to go to skilled nursing facility level of care.  TOC has been consulted at this time.  Discharge Planning: Undetermined. Patient and daughter are wanting to see if STR placement or home health PT is an option.   Family Contact: Attempted to contact dtr by pone. Message left.   IDT:  Updated   Goals of Care: DNR Limited Code   Should patient need ambulance transfer at discharge- please use [facility]) as they contract this service for our active hospice patients.    Nat Babe, BSN, [facility] Liaison [phone]

## 2024-03-20 NOTE — NC FL2 (Signed)
 [place]  MEDICAID FL2 LEVEL OF CARE FORM     IDENTIFICATION  Patient Name: Tracy Park Birthdate: 06-29-1956 Sex: female Admission Date (Current Location): 03/09/2024  [facility] and [place] Number:  Producer, television/film/video and Address:  [facility],  [address], [place] [address]      Provider Number: [id number]  Attending Physician Name and Address:  Sonjia Held, MD  Relative Name and Phone Number:  [name] (Daughter)  [phone] (Mobile)    Current Level of Care: Hospital Recommended Level of Care: Skilled Nursing Facility Prior Approval Number:    Date Approved/Denied:   PASRR Number: pending  Discharge Plan: SNF    Current Diagnoses: Patient Active Problem List   Diagnosis Date Noted   Closed compression fracture of L3 vertebra (HCC) 03/10/2024   Compression fracture of lumbar vertebra (HCC) 03/10/2024   Lumbar compression fracture (HCC) 03/10/2024   Hypokalemia 02/06/2024   Factitious disorder with predominantly psychological signs and symptoms 01/30/2024   Palliative care encounter 01/30/2024   Need for emotional support 01/30/2024   Counseling and coordination of care 01/30/2024   Goals of care, counseling/discussion 01/30/2024   COPD exacerbation (HCC) 01/30/2024   Acute on chronic respiratory failure with hypoxia and hypercapnia (HCC) 12/13/2023   COPD with acute exacerbation 09/22/2023   Paroxysmal atrial fibrillation (HCC) 02/23/2023   Pulmonary nodule 01/25/2023   COPD (chronic obstructive pulmonary disease) (HCC) 01/25/2023   Essential hypertension 01/25/2023   DDD (degenerative disc disease), cervical 03/24/2022   Bipolar disorder (HCC) 03/24/2022   Dyslipidemia 03/13/2022   Anxiety and depression 03/13/2022   History of lung cancer 03/13/2022   Chronic respiratory failure with hypoxia and hypercapnia (HCC) 02/26/2022   Normocytic anemia 02/08/2022   Malnutrition of moderate degree 08/25/2021   Coronary artery  disease involving native coronary artery of native heart without angina pectoris 08/18/2018   Chronic bilateral low back pain without sciatica 08/21/2016   Obstructive sleep apnea (adult) (pediatric) 02/22/2015   Hyperlipidemia 06/03/2013   Vitamin D  deficiency 06/03/2013   Hypothyroidism 04/28/2012   Tobacco use 04/28/2012    Orientation RESPIRATION BLADDER Height & Weight     Self, Time, Situation, Place  O2 (2L O2) Continent Weight: 106 lb 11.2 oz (48.4 kg) Height:  5' 3 (160 cm)  BEHAVIORAL SYMPTOMS/MOOD NEUROLOGICAL BOWEL NUTRITION STATUS      Continent Diet (low sodium)  AMBULATORY STATUS COMMUNICATION OF NEEDS Skin   Limited Assist Verbally Normal                       Personal Care Assistance Level of Assistance  Dressing, Feeding, Bathing Bathing Assistance: Limited assistance Feeding assistance: Limited assistance Dressing Assistance: Limited assistance     Functional Limitations Info  Speech, Hearing, Sight Sight Info: Impaired Hearing Info: Adequate Speech Info: Adequate    SPECIAL CARE FACTORS FREQUENCY  PT (By licensed PT), OT (By licensed OT)     PT Frequency: 5x/wk OT Frequency: 5x/wk            Contractures Contractures Info: Present    Additional Factors Info  Code Status, Allergies Code Status Info: DNR Allergies Info: Red Dye #40 (Allura Red)  Strawberry Extract  Tomato  Aspirin  Tape  Wound Dressing Adhesive           Current Medications (03/20/2024):  This is the current hospital active medication list Current Facility-Administered Medications  Medication Dose Route Frequency Provider Last Rate Last Admin   acetaminophen  (TYLENOL ) tablet  650 mg  650 mg Oral Q6H PRN [name], MD       albuterol  (PROVENTIL ) (2.5 MG/3ML) 0.083% nebulizer solution 2.5 mg  2.5 mg Nebulization Q4H PRN [name], MD       apixaban  (ELIQUIS ) tablet 5 mg  5 mg Oral BID Paudel, Keshab, MD   5 mg at 03/20/24 1004   artificial  tears ophthalmic solution 1 drop  1 drop Left Eye PRN Alto Isaiah CROME, NP       budesonide -glycopyrrolate -formoterol  (BREZTRI ) 160-9-4.8 MCG/ACT inhaler 2 puff  2 puff Inhalation BID [name], MD   2 puff at 03/20/24 9167   calcitonin (salmon) (MIACALCIN MARCIANA) nasal spray 1 spray  1 spray Alternating Nares Daily [name], MD   1 spray at 03/20/24 1046   cyclobenzaprine  (FLEXERIL ) tablet 10 mg  10 mg Oral BID PRN [name], MD   10 mg at 03/19/24 2115   feeding supplement (ENSURE PLUS HIGH PROTEIN) liquid 237 mL  237 mL Oral BID BM [name], MD   237 mL at 03/20/24 1008   iohexol  (OMNIPAQUE ) 300 MG/ML solution 50 mL  50 mL Per Tube Once PRN Hughes Simmonds, MD       ipratropium-albuterol  (DUONEB) 0.5-2.5 (3) MG/3ML nebulizer solution 3 mL  3 mL Nebulization TID Alto Isaiah CROME, NP   3 mL at 03/20/24 1307   levothyroxine  (SYNTHROID ) tablet 125 mcg  125 mcg Oral Q0600 [name], MD   125 mcg at 03/20/24 0542   lidocaine  (LIDODERM ) 5 % 3 patch  3 patch Transdermal Q24H [name], MD   3 patch at 03/13/24 2235   losartan  (COZAAR ) tablet 12.5 mg  12.5 mg Oral Daily Alto Isaiah CROME, NP   12.5 mg at 03/20/24 1003   magnesium  oxide (MAG-OX) tablet 400 mg  400 mg Oral BID Alto Isaiah CROME, NP   400 mg at 03/20/24 1004   melatonin tablet 3 mg  3 mg Oral QHS PRN [name], MD   3 mg at 03/18/24 2118   metoprolol  succinate (TOPROL -XL) 24 hr tablet 12.5 mg  12.5 mg Oral Daily Alto Isaiah L, NP   12.5 mg at 03/20/24 1003   morphine  (PF) 4 MG/ML injection 4 mg  4 mg Intravenous Q4H PRN [name], MD   4 mg at 03/18/24 1353   naloxone  (NARCAN ) injection 0.4 mg  0.4 mg Intravenous PRN [name], MD       ondansetron  (ZOFRAN ) injection 4 mg  4 mg Intravenous Q6H PRN [name], MD   4 mg at 03/10/24 1400   oxyCODONE  (Oxy IR/ROXICODONE ) immediate release tablet 5 mg  5 mg Oral Q4H  PRN [name], MD   5 mg at 03/16/24 9049   Or   oxyCODONE  (Oxy IR/ROXICODONE ) immediate release tablet 10 mg  10 mg Oral Q4H PRN [name], MD   10 mg at 03/20/24 0543   polyethylene glycol (MIRALAX  / GLYCOLAX ) packet 17 g  17 g Oral BID [name], MD   17 g at 03/14/24 9093   prednisoLONE  acetate (PRED FORTE ) 1 % ophthalmic suspension 1 drop  1 drop Left Eye QID Alto Isaiah CROME, NP   1 drop at 03/20/24 1047   rosuvastatin  (CRESTOR ) tablet 20 mg  20 mg Oral Daily Alto Isaiah CROME, NP   20 mg  at 03/20/24 1003   sertraline  (ZOLOFT ) tablet 25 mg  25 mg Oral q AM Alto Isaiah CROME, NP   25 mg at 03/20/24 0602     Discharge Medications: Please see discharge summary for a list of discharge medications.  Relevant Imaging Results:  Relevant Lab Results:   Additional Information SSN [id number]  Sheri ONEIDA Sharps, LCSW

## 2024-03-21 DIAGNOSIS — S32030A Wedge compression fracture of third lumbar vertebra, initial encounter for closed fracture: Secondary | ICD-10-CM | POA: Diagnosis not present

## 2024-03-21 MED ORDER — LIDOCAINE 5 % EX PTCH
1.0000 | MEDICATED_PATCH | CUTANEOUS | Status: DC
  Administered 2024-03-23: 1 via TRANSDERMAL
  Filled 2024-03-21 (×3): qty 1

## 2024-03-21 MED ORDER — GABAPENTIN 100 MG PO CAPS
100.0000 mg | ORAL_CAPSULE | Freq: Three times a day (TID) | ORAL | Status: DC
  Administered 2024-03-21 (×3): 100 mg via ORAL
  Filled 2024-03-21 (×3): qty 1

## 2024-03-21 NOTE — Progress Notes (Signed)
 Occupational Therapy Treatment Patient Details Name: Tracy Park MRN: [id number] DOB: 1956-04-27 Today's Date: 03/21/2024   History of present illness 68 year old female re-admitted to [facility] on 03/09/24 after beginning home hospice care for pain control as well as PT/OT in the setting of acute L3 compression fracture after presenting with acute worsening of her low back discomfort when attempting to sit down.  Pt s/p L3/4 kyphoplasties on 03/17/24.  Previous recent admission on 03/02/24 for Acute COPD exacerbation.  Past medical history of hypertension, depression, anxiety, paroxysmal A-fib on Eliquis , lung cancer status post lumpectomy, severe COPD with multiple admissions now enrolled with outpatient hospice, chronic hypoxic/hypercarbic respiratory failure.   OT comments  Pt very agreeable to OT. Pt states MD trying a new med to help her with pain      If plan is discharge home, recommend the following:  A little help with walking and/or transfers;A little help with bathing/dressing/bathroom;Assistance with cooking/housework;Assist for transportation   Equipment Recommendations  None recommended by OT    Recommendations for Other Services      Precautions / Restrictions Precautions Precautions: Fall;Back Precaution/Restrictions Comments: 2L O2 [place] baseline Required Braces or Orthoses: Spinal Brace Spinal Brace: Lumbar corset;Applied in sitting position Restrictions Weight Bearing Restrictions Per Provider Order: No       Mobility Bed Mobility Overal bed mobility: Modified Independent Bed Mobility: Rolling, Sidelying to Sit, Sit to Supine Rolling: Supervision, Used rails Sidelying to sit: Supervision, HOB elevated, Used rails   Sit to supine: Min assist        Transfers Overall transfer level: Needs assistance   Transfers: Bed to chair/wheelchair/BSC Sit to Stand: Supervision Stand pivot transfers: Supervision   Step pivot transfers: Supervision     General transfer  comment: did not stand as pt declined     Balance Overall balance assessment: Mild deficits observed, not formally tested                                         ADL either performed or assessed with clinical judgement   ADL Overall ADL's : Needs assistance/impaired Eating/Feeding: Independent   Grooming: Wash/dry hands;Wash/dry face;Oral care;Set up;Standing               Lower Body Dressing: Set up;Sitting/lateral leans Lower Body Dressing Details (indicate cue type and reason): socks included Toilet Transfer: Contact guard assist;Ambulation;Rolling walker (2 wheels)             General ADL Comments: pt agreed to OOB.  REinterated back precautions . Pt did well donning socks while following back precautions    Extremity/Trunk Assessment Upper Extremity Assessment Upper Extremity Assessment: Generalized weakness            Vision   Vision Assessment?: No apparent visual deficits             Cognition Arousal: Alert Behavior During Therapy: WFL for tasks assessed/performed Cognition: No apparent impairments                               Following commands: Intact                 General Comments      Pertinent Vitals/ Pain       Pain Assessment Pain Assessment: 0-10 Pain Score: 3  Pain Location: back Pain Descriptors / Indicators: Discomfort, Sore  Pain Intervention(s): Limited activity within patient's tolerance, Monitored during session  Home Living                                              Frequency  Min 2X/week        Progress Toward Goals  OT Goals(current goals can now be found in the care plan section)  Progress towards OT goals: Progressing toward goals  Acute Rehab OT Goals Patient Stated Goal: not to DC hospital now Time For Goal Achievement: 03/24/24 Potential to Achieve Goals: Fair  Plan         AM-PAC OT 6 Clicks Daily Activity     Outcome Measure   Help  from another person eating meals?: None Help from another person taking care of personal grooming?: None Help from another person toileting, which includes using toliet, bedpan, or urinal?: A Lot Help from another person bathing (including washing, rinsing, drying)?: A Little Help from another person to put on and taking off regular upper body clothing?: None Help from another person to put on and taking off regular lower body clothing?: A Little 6 Click Score: 18    End of Session    OT Visit Diagnosis: Muscle weakness (generalized) (M62.81)   Activity Tolerance Patient tolerated treatment well   Patient Left in chair;with call bell/phone within reach   Nurse Communication Mobility status        Time: 8883-8873 OT Time Calculation (min): 10 min  Charges: OT General Charges $OT Visit: 1 Visit OT Treatments $Self Care/Home Management : 8-22 mins    Kieffer Blatz, Norvel BIRCH 03/21/2024, 1:39 PM

## 2024-03-21 NOTE — Progress Notes (Signed)
 [facility] 1325 [facility] Patient Visit   Tracy Park is a current [facility] hospice patient with a terminal diagnosis of chronic obstructive pulmonary disease with acute exacerbation. Patient discharged home Monday afternoon with worsening back pain which only intensified once home. She made the decision to come to the ED for further evaluation and was found to have an acute compression fracture of her lumbar region. Per Dr. Norleen Laurence with [facility] this is a related hospital admission.    Visited patient at bedside. She was sitting up in recliner watching television. Discussed plan to continue with rehab following hospital discharge. Discussed that when facility is selected, revocation of hospice would take place and she could elect to have Outpatient Palliative Care during that time and transition back to hospice after rehab has been completed. Patient verbalized understanding and had no questions. Patient reports she is still experiencing some pain and describes it as a dull ache. Also verbalized that she is having more sputum production and has been using Facilities manager more. TOC working on submitting patient information for bed bed availability.    She remains inpatient appropriate for pain management including recent kyphoplasty and IV medications for pain.   Vital Signs: 98.5/82/17   118/79   O2 98% on 2L [place]   I&O: 680/1500   Abnormal labs:  None new     Diagnostics:  none new   IV/PRN Meds: Flexeril  10mg  po x1, oxycodone  10mg  po x 2   Problem List from note 7.13.25 Pokhrel, Laxman, MD     Assessment/Plan: Principal Problem:   Closed compression fracture of L3 vertebra (HCC) Active Problems:   Compression fracture of lumbar vertebra (HCC)   Lumbar compression fracture (HCC)   L3/L4 compression fracture, s/p kyphoplasty by IR 7/9 -Continue , lidocaine  patch, Flexeril , oxycodone .  Will add low-dose gabapentin  100 mg 3 times daily since  she still complains of pain.    COPD exacerbation Continue bronchodilators.  On home oxygen .   Chronic respiratory failure with hypoxia Patient is on 2 L/min of oxygen  which is her baseline.  Currently at baseline. Essential hypertension - Continue losartan , metoprolol  succinate   Hypothyroidism - Continue Synthroid    History of lung cancer Patient has a history of left upper lobe lobectomy.  Patient was on home hospice and DNR.  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    IDT: Updated   Goals of Care: DNR Limited Code   Should patient need ambulance transfer at discharge- please use [facility]) as they contract this service for our active hospice patients.    Nat Babe, BSN, [facility] Liaison [phone]

## 2024-03-21 NOTE — Plan of Care (Signed)
   Problem: Activity: Goal: Risk for activity intolerance will decrease Outcome: Progressing   Problem: Nutrition: Goal: Adequate nutrition will be maintained Outcome: Progressing   Problem: Elimination: Goal: Will not experience complications related to bowel motility Outcome: Progressing   Problem: Pain Managment: Goal: General experience of comfort will improve and/or be controlled Outcome: Progressing   Problem: Safety: Goal: Ability to remain free from injury will improve Outcome: Progressing   Problem: Skin Integrity: Goal: Risk for impaired skin integrity will decrease Outcome: Progressing

## 2024-03-21 NOTE — Progress Notes (Signed)
 PROGRESS NOTE  Tracy BERTONI FMW:[id number] DOB: [birth year] DOA: 03/09/2024 PCP: Corlis Longs, FNP   LOS: 11 days   Brief narrative:  Tracy Park is a 68 y.o. female with a history of COPD, adenocarcinoma of the lung status post left upper lobe lobectomy, hypothyroidism, anxiety, PTSD, atrial fibrillation presented to the hospital secondary to back pain was found to have evidence of a new L3 compression fracture.  Patient started on analgesics, calcitonin, back brace for management.  Patient with continued intractable pain and now status post kyphoplasty 03/17/2024.  She needs to continue with physical and Occupational Therapy and pain control.   She has oxygen  2 L at home.  At this time patient was wishing to go to short-term rehabilitation prior to going home.  Assessment/Plan: Principal Problem:   Closed compression fracture of L3 vertebra (HCC) Active Problems:   Compression fracture of lumbar vertebra (HCC)   Lumbar compression fracture (HCC)  L3/L4 compression fracture, s/p kyphoplasty by IR 7/9 -Continue , lidocaine  patch, Flexeril , oxycodone .  Will add low-dose gabapentin  100 mg 3 times daily since she still complains of pain.    COPD exacerbation Continue bronchodilators.  On home oxygen .   Chronic respiratory failure with hypoxia Patient is on 2 L/min of oxygen  which is her baseline.  Currently at baseline. Essential hypertension - Continue losartan , metoprolol  succinate   Hypothyroidism - Continue Synthroid    History of lung cancer Patient has a history of left upper lobe lobectomy.  Patient was on home hospice and DNR.  Currently wishes to go to short-term rehab   Paroxysmal atrial fibrillation Continue metoprolol  and Eliquis , further plans per hospice   Hyperlipidemia - Continue Crestor    Depression - Continue Zoloft    DVT prophylaxis: Place and maintain sequential compression device Start: 03/15/24 0000 SCDs Start: 03/10/24 0154 apixaban  (ELIQUIS ) tablet  5 mg   Disposition: Short-term rehab, TOC on board.  Status is: Inpatient Remains inpatient appropriate because: Awaiting for disposition to rehabilitation.    Code Status:     Code Status: Limited: Do not attempt resuscitation (DNR) -DNR-LIMITED -Do Not Intubate/DNI   Family Communication: None at bedside  Consultants: Interventional radiology/hospice team  Procedures: Kyphoplasty by interventional radiology  Anti-infectives:  None   Subjective: Today, patient was seen and examined at bedside.  Complains of a productive sputum.  Still complains of pain in the back.  Has any nausea vomiting fever chills or rigor.  Objective: Vitals:   03/21/24 0524 03/21/24 0907  BP: 118/79   Pulse: 82   Resp: 17   Temp: (!) 97.5 F (36.4 C)   SpO2: 98% 98%    Intake/Output Summary (Last 24 hours) at 03/21/2024 1106 Last data filed at 03/21/2024 0700 Gross per 24 hour  Intake 520 ml  Output 2000 ml  Net -1480 ml   Filed Weights   03/19/24 0500 03/20/24 0519 03/21/24 0535  Weight: 48.7 kg 48.4 kg 50.4 kg   Body mass index is 19.68 kg/m.   Physical Exam: GENERAL: Patient is alert awake and oriented. Not in obvious distress.  Thinly built, on nasal cannula oxygen , appears chronically ill and deconditioned. HENT: No scleral pallor or icterus. Pupils equally reactive to light. Oral mucosa is moist NECK: is supple, no gross swelling noted. CHEST: .  Diminished breath sounds bilaterally.  Coarse breath sounds noted. CVS: S1 and S2 heard, no murmur. Regular rate and rhythm.  ABDOMEN: Soft, non-tender, bowel sounds are present. EXTREMITIES: No edema. CNS: Cranial nerves are intact.  Moves extremities  SKIN: warm and dry without rashes.  Data Review: I have personally reviewed the following laboratory data and studies,  CBC: Recent Labs  Lab 03/16/24 0005 03/17/24 0316 03/18/24 0319 03/19/24 0316 03/20/24 0343  WBC 13.9* 14.2* 15.2* 12.3* 10.5  NEUTROABS 10.0*  --   --    --   --   HGB 10.4* 10.7* 10.9* 10.3* 10.1*  HCT 33.6* 34.7* 35.5* 34.7* 32.7*  MCV 94.4 95.1 95.4 96.1 94.5  PLT 209 216 200 181 184   Basic Metabolic Panel: Recent Labs  Lab 03/17/24 0316 03/17/24 1753 03/18/24 0319 03/19/24 0316  NA 139 139  --  138  K 3.6 4.1  --  3.9  CL 100 98  --  97*  CO2 30 32  --  33*  GLUCOSE 107* 101*  --  115*  BUN 21 12  --  23  CREATININE 0.37* 0.39*  --  0.68  CALCIUM  8.7* 8.8*  --  8.6*  MG  --   --  2.0  --    Liver Function Tests: Recent Labs  Lab 03/17/24 1753  AST 22  ALT 26  ALKPHOS 107  BILITOT 0.5  PROT 6.4*  ALBUMIN 3.4*   No results for input(s): LIPASE, AMYLASE in the last 168 hours. No results for input(s): AMMONIA in the last 168 hours. Cardiac Enzymes: No results for input(s): CKTOTAL, CKMB, CKMBINDEX, TROPONINI in the last 168 hours. BNP (last 3 results) Recent Labs    01/02/24 1300 01/15/24 0448 02/05/24 1907  BNP 124.9* 95.3 94.4    ProBNP (last 3 results) No results for input(s): PROBNP in the last 8760 hours.  CBG: No results for input(s): GLUCAP in the last 168 hours. No results found for this or any previous visit (from the past 240 hours).   Studies: No results found.    Vernal Alstrom, MD  [facility] 03/21/2024  If 7PM-7AM, please contact night-coverage

## 2024-03-21 NOTE — Progress Notes (Deleted)
 Occupational Therapy Treatment Patient Details Name: Tracy Park MRN: [id number] DOB: [birth year] Today's Date: 03/21/2024   History of present illness 68 year old female re-admitted to [facility] on 66/1/25 after beginning home hospice care for pain control as well as PT/OT in the setting of acute L3 compression fracture after presenting with acute worsening of her low back discomfort when attempting to sit down.  Pt s/p L3/4 kyphoplasties on 03/17/24.  Previous recent admission on 03/02/24 for Acute COPD exacerbation.  Past medical history of hypertension, depression, anxiety, paroxysmal A-fib on Eliquis , lung cancer status post lumpectomy, severe COPD with multiple admissions now enrolled with outpatient hospice, chronic hypoxic/hypercarbic respiratory failure.   OT comments  Pt refusing DC at this time.  Declined OOB. RN aware      If plan is discharge home, recommend the following:  A little help with walking and/or transfers;A little help with bathing/dressing/bathroom;Assistance with cooking/housework;Assist for transportation   Equipment Recommendations  None recommended by OT    Recommendations for Other Services      Precautions / Restrictions Precautions Precautions: Fall;Back Precaution/Restrictions Comments: 2L O2 [employer] baseline Required Braces or Orthoses: Spinal Brace Spinal Brace: Lumbar corset;Applied in sitting position       Mobility Bed Mobility Overal bed mobility: Needs Assistance Bed Mobility: Rolling, Sidelying to Sit, Sit to Supine Rolling: Supervision, Used rails Sidelying to sit: Supervision, HOB elevated, Used rails   Sit to supine: Min assist        Transfers                   General transfer comment: did not stand as pt declined     Balance Overall balance assessment: Mild deficits observed, not formally tested                                         ADL either performed or assessed with clinical judgement   ADL Overall  ADL's : Needs assistance/impaired Eating/Feeding: Independent   Grooming: Wash/dry hands;Wash/dry face;Oral care;Sitting;Set up                                 General ADL Comments: pt sat EOB with OT for grooming activity. Pt needed to lie down and verbalizing concern about going home.Pt states she needs to have a BM and hadnt been able too.    Extremity/Trunk Assessment Upper Extremity Assessment Upper Extremity Assessment: Generalized weakness            Vision   Vision Assessment?: No apparent visual deficits             Cognition Arousal: Alert Behavior During Therapy: Flat affect Cognition: No apparent impairments                                                      Pertinent Vitals/ Pain       Pain Assessment Pain Assessment: 0-10 Pain Score: 3  Pain Location: back Pain Descriptors / Indicators: Discomfort, Sore Pain Intervention(s): Limited activity within patient's tolerance, Monitored during session         Frequency  Min 2X/week        Progress Toward Goals  OT Goals(current goals  can now be found in the care plan section)  Progress towards OT goals: OT to reassess next treatment  Acute Rehab OT Goals Patient Stated Goal: not to DC hospital now Time For Goal Achievement: 03/24/24 Potential to Achieve Goals: Fair  Plan         AM-PAC OT 6 Clicks Daily Activity     Outcome Measure   Help from another person eating meals?: A Little Help from another person taking care of personal grooming?: A Little Help from another person toileting, which includes using toliet, bedpan, or urinal?: A Lot Help from another person bathing (including washing, rinsing, drying)?: A Lot Help from another person to put on and taking off regular upper body clothing?: A Lot Help from another person to put on and taking off regular lower body clothing?: A Lot 6 Click Score: 14    End of Session    OT Visit Diagnosis: Muscle  weakness (generalized) (M62.81)   Activity Tolerance Patient limited by fatigue   Patient Left in bed;with call bell/phone within reach;with bed alarm set   Nurse Communication Mobility status        Time: 8883-8873 OT Time Calculation (min): 10 min  Charges: OT General Charges $OT Visit: 1 Visit OT Treatments $Self Care/Home Management : 8-22 mins    Maddux Vanscyoc, Norvel D 03/21/2024, 1:10 PM

## 2024-03-22 DIAGNOSIS — J441 Chronic obstructive pulmonary disease with (acute) exacerbation: Secondary | ICD-10-CM | POA: Diagnosis present

## 2024-03-22 DIAGNOSIS — Z66 Do not resuscitate: Secondary | ICD-10-CM | POA: Diagnosis present

## 2024-03-22 DIAGNOSIS — Z902 Acquired absence of lung [part of]: Secondary | ICD-10-CM | POA: Diagnosis not present

## 2024-03-22 DIAGNOSIS — F17210 Nicotine dependence, cigarettes, uncomplicated: Secondary | ICD-10-CM | POA: Diagnosis present

## 2024-03-22 DIAGNOSIS — Z9981 Dependence on supplemental oxygen: Secondary | ICD-10-CM | POA: Diagnosis not present

## 2024-03-22 DIAGNOSIS — Z85118 Personal history of other malignant neoplasm of bronchus and lung: Secondary | ICD-10-CM | POA: Diagnosis not present

## 2024-03-22 DIAGNOSIS — I252 Old myocardial infarction: Secondary | ICD-10-CM | POA: Diagnosis not present

## 2024-03-22 DIAGNOSIS — F319 Bipolar disorder, unspecified: Secondary | ICD-10-CM | POA: Diagnosis present

## 2024-03-22 DIAGNOSIS — J9611 Chronic respiratory failure with hypoxia: Secondary | ICD-10-CM | POA: Diagnosis present

## 2024-03-22 DIAGNOSIS — E785 Hyperlipidemia, unspecified: Secondary | ICD-10-CM | POA: Diagnosis present

## 2024-03-22 DIAGNOSIS — S32030A Wedge compression fracture of third lumbar vertebra, initial encounter for closed fracture: Secondary | ICD-10-CM | POA: Diagnosis not present

## 2024-03-22 DIAGNOSIS — I480 Paroxysmal atrial fibrillation: Secondary | ICD-10-CM | POA: Diagnosis present

## 2024-03-22 DIAGNOSIS — M4856XA Collapsed vertebra, not elsewhere classified, lumbar region, initial encounter for fracture: Secondary | ICD-10-CM | POA: Diagnosis present

## 2024-03-22 DIAGNOSIS — Z79899 Other long term (current) drug therapy: Secondary | ICD-10-CM | POA: Diagnosis not present

## 2024-03-22 DIAGNOSIS — Z515 Encounter for palliative care: Secondary | ICD-10-CM | POA: Diagnosis not present

## 2024-03-22 DIAGNOSIS — I10 Essential (primary) hypertension: Secondary | ICD-10-CM | POA: Diagnosis present

## 2024-03-22 DIAGNOSIS — E890 Postprocedural hypothyroidism: Secondary | ICD-10-CM | POA: Diagnosis present

## 2024-03-22 DIAGNOSIS — Z7901 Long term (current) use of anticoagulants: Secondary | ICD-10-CM | POA: Diagnosis not present

## 2024-03-22 DIAGNOSIS — Z79890 Hormone replacement therapy (postmenopausal): Secondary | ICD-10-CM | POA: Diagnosis not present

## 2024-03-22 MED ORDER — GABAPENTIN 300 MG PO CAPS
300.0000 mg | ORAL_CAPSULE | Freq: Three times a day (TID) | ORAL | Status: DC
  Administered 2024-03-22 – 2024-03-23 (×4): 300 mg via ORAL
  Filled 2024-03-22 (×4): qty 1

## 2024-03-22 NOTE — Progress Notes (Addendum)
 Physical Therapy Treatment Patient Details Name: Tracy Park MRN: [id number] DOB: Jun 25, 1956 Today's Date: 03/22/2024   History of Present Illness 68 year old female re-admitted to [facility] on 03/09/24 after beginning home hospice care for pain control as well as PT/OT in the setting of acute L3 compression fracture after presenting with acute worsening of her low back discomfort when attempting to sit down.  Pt s/p L3/4 kyphoplasties on 03/17/24.  Previous recent admission on 03/02/24 for Acute COPD exacerbation.  Past medical history of hypertension, depression, anxiety, paroxysmal A-fib on Eliquis , lung cancer status post lumpectomy, severe COPD with multiple admissions now enrolled with outpatient hospice, chronic hypoxic/hypercarbic respiratory failure. Kyphoplasty 03/17/24    PT Comments  Pt is AxO x 3 pleasant and motivated.  She lives home alone and her daughter assists with groceries/transportation/heavy cleaning.   Pt had her Kyphoplasty 03/17/24 and showing some progress.  Assisted OOB to amb to bathroom.  General transfer comment: depending on surface level and current pain level, Pt required anywhere from Min Assist to Supervision with increased difficulty performing stand to sit (pain).  Pt also requires VC's on safety with turns and proper hand placement to steady self. General Gait Details: Limited amb distance of 6 feet x 2 to and from bathroom due to increased back pain with activity as well as 2/4 dyspnea.  Pt remained on 2 lts nasal with avg sats 94% and also required an extended rest break due to dyspnea.  VC's on proper Purse Lip breathing.  Limited activity tolerance. LPT has rec Pt will need ST Rehab at SNF to address mobility and functional decline prior to safely returning home.    If plan is discharge home, recommend the following: A little help with walking and/or transfers;A little help with bathing/dressing/bathroom;Assistance with cooking/housework;Assist for transportation;Help  with stairs or ramp for entrance   Can travel by private vehicle     No  Equipment Recommendations  None recommended by PT    Recommendations for Other Services       Precautions / Restrictions Precautions Precautions: Fall;Back Precaution/Restrictions Comments: 2L O2 [employer] baseline Restrictions Other Position/Activity Restrictions: LSO no longer indicated after KP, consulted LPT and Pt     Mobility  Bed Mobility Overal bed mobility: Modified Independent, Needs Assistance Bed Mobility: Supine to Sit, Sit to Supine     Supine to sit: Supervision Sit to supine: Supervision, Contact guard assist   General bed mobility comments: increased time and effort with greater difficulty back to bed due to back pain    Transfers Overall transfer level: Needs assistance Equipment used: Rolling walker (2 wheels) Transfers: Sit to/from Stand Sit to Stand: Contact guard assist, Supervision, Min assist           General transfer comment: depending on surface level and current pain level, Pt required anywhere from Min Assist to Supervision with increased difficulty performing stand to sit (pain).  Pt also requires VC's on safety with turns and proper hand placement to steady self.    Ambulation/Gait Ambulation/Gait assistance: Min assist, Mod assist Gait Distance (Feet): 18 Feet Assistive device: Rolling walker (2 wheels) Gait Pattern/deviations: Step-through pattern, Decreased stride length, Narrow base of support Gait velocity: decr     General Gait Details: Limited amb distance of 6 feet x 2 to and from bathroom due to increased back pain with activity as well as 2/4 dyspnea.  Pt remained on 2 lts nasal with avg sats 94% and also required an extended rest break due to dyspnea.  VC's on proper Purse Lop breathing.  Limited activity tolerance.   Stairs             Wheelchair Mobility     Tilt Bed    Modified Rankin (Stroke Patients Only)       Balance                                             Communication Communication Communication: No apparent difficulties  Cognition Arousal: Alert     PT - Cognitive impairments: No apparent impairments                       PT - Cognition Comments: AxO x 3 pleasant ans motivated.  Home oxygen  2 lts almost a year COPD Following commands: Intact      Cueing Cueing Techniques: Verbal cues  Exercises      General Comments        Pertinent Vitals/Pain Pain Assessment Pain Assessment: 0-10 Pain Score: 8  Pain Location: back plus L buttock Pain Descriptors / Indicators: Grimacing, Tender, Operative site guarding Pain Intervention(s): Monitored during session, Repositioned, Patient requesting pain meds-RN notified    Home Living                          Prior Function            PT Goals (current goals can now be found in the care plan section) Progress towards PT goals: Progressing toward goals    Frequency    Min 3X/week      PT Plan      Co-evaluation              AM-PAC PT 6 Clicks Mobility   Outcome Measure  Help needed turning from your back to your side while in a flat bed without using bedrails?: A Little Help needed moving from lying on your back to sitting on the side of a flat bed without using bedrails?: A Little Help needed moving to and from a bed to a chair (including a wheelchair)?: A Little Help needed standing up from a chair using your arms (e.g., wheelchair or bedside chair)?: A Little Help needed to walk in hospital room?: A Lot Help needed climbing 3-5 steps with a railing? : A Lot 6 Click Score: 16    End of Session Equipment Utilized During Treatment: Gait belt;Oxygen  Activity Tolerance: Patient limited by fatigue;Patient limited by pain Patient left: in bed;with call bell/phone within reach Nurse Communication: Mobility status;Patient requests pain meds PT Visit Diagnosis: Difficulty in walking, not  elsewhere classified (R26.2)     Time: 9047-8992 PT Time Calculation (min) (ACUTE ONLY): 15 min  Charges:    $Gait Training: 8-22 mins PT General Charges $$ ACUTE PT VISIT: 1 Visit                     Katheryn Leap  PTA Acute  Rehabilitation Services Office M-F          [phone]

## 2024-03-22 NOTE — Progress Notes (Signed)
 [facility] Patient Visit   Tracy Park is a current [facility] hospice patient with a terminal diagnosis of chronic obstructive pulmonary disease with acute exacerbation. Patient discharged home Monday afternoon with worsening back pain which only intensified once home. She made the decision to come to the ED for further evaluation and was found to have an acute compression fracture of her lumbar region. Per Dr. Norleen Laurence with [facility] this is a related hospital admission.   Met with patient at bedside. She was sitting up in the bed with her eyes closed. Gently spoke with her and she opened her eyes. Patient is her own spokeswoman. Explained to her that she will need to sign revocation paperwork for [facility] in order to be discharged to rehab. She stated that she understood and signed the revocation and dischcarge paperwork.   She has revoked her hospice benefit today in order to seek skilled rehab. Palliative referral will be initiated.   Greig Basket, BSN, RN [facility] Liaison [phone]

## 2024-03-22 NOTE — Plan of Care (Signed)
   Problem: Elimination: Goal: Will not experience complications related to urinary retention Outcome: Progressing   Problem: Pain Managment: Goal: General experience of comfort will improve and/or be controlled Outcome: Progressing   Problem: Safety: Goal: Ability to remain free from injury will improve Outcome: Progressing

## 2024-03-22 NOTE — Progress Notes (Signed)
 PROGRESS NOTE  Tracy Park FMW:[id number] DOB: 24-Jul-1956 DOA: 03/09/2024 PCP: Corlis Longs, FNP   LOS: 12 days   Brief narrative:  Tracy Park is a 68 y.o. female with a history of COPD, adenocarcinoma of the lung status post left upper lobe lobectomy, hypothyroidism, anxiety, PTSD, atrial fibrillation presented to the hospital secondary to back pain was found to have evidence of a new L3 compression fracture.  Patient started on analgesics, calcitonin, back brace for management.  Patient with continued intractable pain and now status post kyphoplasty 03/17/2024.  She needs to continue with physical and Occupational Therapy and pain control.   She has oxygen  2 L at home.  At this time patient wishing to go to short-term rehabilitation prior to going home.  Assessment/Plan: Principal Problem:   Closed compression fracture of L3 vertebra (HCC) Active Problems:   Compression fracture of lumbar vertebra (HCC)   Lumbar compression fracture (HCC)  L3/L4 compression fracture, s/p kyphoplasty by IR 7/9 -Continue , lidocaine  patch, Flexeril , oxycodone .  Started on low-dose gabapentin  100 mg 3 times daily since yesterday but still with no significant effect.  Will increase the dose of gabapentin  to 300 mg 3 times daily from today.  Still complains of pain.  COPD exacerbation Continue bronchodilators.  On home oxygen .   Chronic respiratory failure with hypoxia Patient is on 2 L/min of oxygen  which is her baseline.  Currently at baseline.  Been having some productive sputum.  Essential hypertension - Continue losartan , metoprolol  succinate   Hypothyroidism - Continue Synthroid    History of lung cancer Patient has a history of left upper lobe lobectomy.  Patient was on home hospice and DNR.  Currently wishes to go to short-term rehab   Paroxysmal atrial fibrillation Continue metoprolol  and Eliquis , further plans per hospice   Hyperlipidemia - Continue Crestor    Depression -  Continue Zoloft    DVT prophylaxis: Place and maintain sequential compression device Start: 03/15/24 0000 SCDs Start: 03/10/24 0154 apixaban  (ELIQUIS ) tablet 5 mg   Disposition: Short-term rehab, TOC on board.  Medically stable for disposition  Status is: Inpatient Remains inpatient appropriate because: Awaiting for disposition to rehabilitation.    Code Status:     Code Status: Limited: Do not attempt resuscitation (DNR) -DNR-LIMITED -Do Not Intubate/DNI   Family Communication: None at bedside  Consultants: Interventional radiology/hospice team  Procedures: Kyphoplasty by interventional radiology  Anti-infectives:  None   Subjective: Today, patient was seen and examined at bedside.  Complains of pain still in the back not adequately controlled.  Having some cough with productive sputum.  Objective: Vitals:   03/22/24 0623 03/22/24 0900  BP: 120/82   Pulse: 88   Resp: 20   Temp: 98 F (36.7 C)   SpO2: 95% 94%    Intake/Output Summary (Last 24 hours) at 03/22/2024 0906 Last data filed at 03/22/2024 0600 Gross per 24 hour  Intake 1200 ml  Output 1400 ml  Net -200 ml   Filed Weights   03/20/24 0519 03/21/24 0535 03/22/24 0500  Weight: 48.4 kg 50.4 kg 49.4 kg   Body mass index is 19.29 kg/m.   Physical Exam: General:  Average built, not in obvious distress, thinly built, on nasal cannula oxygen , appears chronically ill and deconditioned. HENT:   No scleral pallor or icterus noted. Oral mucosa is moist.  Chest:   Diminished breath sounds bilaterally. CVS: S1 &S2 heard. No murmur.  Regular rate and rhythm. Abdomen: Soft, nontender, nondistended.  Bowel sounds are heard.  Tenderness  over the back. Extremities: No cyanosis, clubbing or edema.  Peripheral pulses are palpable. Psych: Alert, awake and oriented, normal mood CNS:  No cranial nerve deficits.  Moves extremities. Skin: Warm and dry.  No rashes noted.  Data Review: I have personally reviewed the  following laboratory data and studies,  CBC: Recent Labs  Lab 03/16/24 0005 03/17/24 0316 03/18/24 0319 03/19/24 0316 03/20/24 0343  WBC 13.9* 14.2* 15.2* 12.3* 10.5  NEUTROABS 10.0*  --   --   --   --   HGB 10.4* 10.7* 10.9* 10.3* 10.1*  HCT 33.6* 34.7* 35.5* 34.7* 32.7*  MCV 94.4 95.1 95.4 96.1 94.5  PLT 209 216 200 181 184   Basic Metabolic Panel: Recent Labs  Lab 03/17/24 0316 03/17/24 1753 03/18/24 0319 03/19/24 0316  NA 139 139  --  138  K 3.6 4.1  --  3.9  CL 100 98  --  97*  CO2 30 32  --  33*  GLUCOSE 107* 101*  --  115*  BUN 21 12  --  23  CREATININE 0.37* 0.39*  --  0.68  CALCIUM  8.7* 8.8*  --  8.6*  MG  --   --  2.0  --    Liver Function Tests: Recent Labs  Lab 03/17/24 1753  AST 22  ALT 26  ALKPHOS 107  BILITOT 0.5  PROT 6.4*  ALBUMIN 3.4*   No results for input(s): LIPASE, AMYLASE in the last 168 hours. No results for input(s): AMMONIA in the last 168 hours. Cardiac Enzymes: No results for input(s): CKTOTAL, CKMB, CKMBINDEX, TROPONINI in the last 168 hours. BNP (last 3 results) Recent Labs    01/02/24 1300 01/15/24 0448 02/05/24 1907  BNP 124.9* 95.3 94.4    ProBNP (last 3 results) No results for input(s): PROBNP in the last 8760 hours.  CBG: No results for input(s): GLUCAP in the last 168 hours. No results found for this or any previous visit (from the past 240 hours).   Studies: No results found.    Vernal Alstrom, MD  [facility] 03/22/2024  If 7PM-7AM, please contact night-coverage

## 2024-03-22 NOTE — TOC Progression Note (Addendum)
 Transition of Care [facility]) - Progression Note    Patient Details  Name: Tracy Park MRN: [id number] Date of Birth: 1956/04/23  Transition of Care [facility]) CM/SW Contact  NORMAN ASPEN, LCSW Phone Number: 03/22/2024, 1:48 PM  Clinical Narrative:      ADDENDUM:  have received insurance authorization and PASRR #.  Facility can admit pt tomorrow.  Pt/ MD/ RN aware.   Have reviewed SNF bed offers with pt and she has accepted bed at [facility].  Have begun insurance authorization and continue to await PASRR.  Will keep pt and team updated.   Expected Discharge Plan: Home w Hospice Care Barriers to Discharge: Continued Medical Work up  Expected Discharge Plan and Services   Discharge Planning Services: NA Post Acute Care Choice: Hospice Living arrangements for the past 2 months: Single Family Home Expected Discharge Date: 03/19/24               DME Arranged: N/A DME Agency: NA       HH Arranged: NA HH Agency: NA         Social Determinants of Health (SDOH) Interventions SDOH Screenings   Food Insecurity: No Food Insecurity (03/10/2024)  Recent Concern: Food Insecurity - Food Insecurity Present (03/03/2024)  Housing: Low Risk  (03/10/2024)  Transportation Needs: No Transportation Needs (03/10/2024)  Utilities: Not At Risk (03/10/2024)  Financial Resource Strain: High Risk (10/08/2023)   Received from [facility]  Physical Activity: Unknown (04/07/2023)   Received from [facility]  Social Connections: Socially Isolated (03/10/2024)  Stress: Stress Concern Present (04/07/2023)   Received from [facility]  Tobacco Use: High Risk (03/13/2024)    Readmission Risk Interventions    03/13/2024    3:54 PM 03/04/2024    2:47 PM 01/14/2024    2:11 PM  Readmission Risk Prevention Plan  Transportation Screening Complete Complete Complete  Medication Review Oceanographer) Complete Complete Complete  PCP or Specialist appointment within 3-5 days of discharge Complete Complete Complete   HRI or Home Care Consult Complete Complete --  SW Recovery Care/Counseling Consult Complete Complete --  Palliative Care Screening Complete Not Applicable Not Applicable  Skilled Nursing Facility Not Applicable Not Applicable Not Applicable

## 2024-03-23 DIAGNOSIS — S32030A Wedge compression fracture of third lumbar vertebra, initial encounter for closed fracture: Secondary | ICD-10-CM

## 2024-03-23 MED ORDER — OXYCODONE HCL 5 MG PO TABS: 5.0000 mg | ORAL_TABLET | ORAL | 0 refills | Status: DC | PRN

## 2024-03-23 MED ORDER — MORPHINE SULFATE (CONCENTRATE) 10 MG /0.5 ML PO SOLN
5.0000 mg | ORAL | 0 refills | Status: DC | PRN
Start: 2024-03-23 — End: 2024-05-21

## 2024-03-23 MED ORDER — GABAPENTIN 300 MG PO CAPS: 300.0000 mg | ORAL_CAPSULE | Freq: Three times a day (TID) | ORAL | Status: DC

## 2024-03-23 NOTE — Progress Notes (Signed)
 Nurse to Nurse report given to Nurse Vonzella at [facility]. Patient aware and in agreement with transfer.

## 2024-03-23 NOTE — TOC Transition Note (Signed)
 Transition of Care [facility]) - Discharge Note   Patient Details  Name: Tracy Park MRN: [id number] Date of Birth: 07-Jan-1956  Transition of Care [facility]) CM/SW Contact:  Heather DELENA Saltness, LCSW Phone Number: 03/23/2024, 10:48 AM   Clinical Narrative:    Pt to discharge to [facility] today. Pt accepted to room 908. D/C packet with prescriptions and signed DNR placed in chart at RN station. RN to call report to [phone]. PTAR called at 10:57 AM. Patient made aware and in agreement with discharge plan. No further TOC needs at this time. TOC signing off.     Final next level of care: Skilled Nursing Facility Barriers to Discharge: Barriers Resolved   Patient Goals and CMS Choice Patient states their goals for this hospitalization and ongoing recovery are:: To go to [employer] Compare Post Acute Care list provided to:: Patient Choice offered to / list presented to : Patient [place] ownership interest in [facility].provided to:: Patient    Discharge Placement  [facility]            Patient chooses bed at: [facility] Patient to be transferred to facility by: PTAR Name of family member notified: Patient Patient and family notified of of transfer: 03/23/24  Discharge Plan and Services Additional resources added to the After Visit Summary for N/A   Discharge Planning Services: NA Post Acute Care Choice: Hospice          DME Arranged: N/A DME Agency: NA       HH Arranged: NA HH Agency: NA        Social Drivers of Health (SDOH) Interventions SDOH Screenings   Food Insecurity: No Food Insecurity (03/10/2024)  Recent Concern: Food Insecurity - Food Insecurity Present (03/03/2024)  Housing: Low Risk  (03/10/2024)  Transportation Needs: No Transportation Needs (03/10/2024)  Utilities: Not At Risk (03/10/2024)  Financial Resource Strain: High Risk (10/08/2023)   Received from [facility]  Physical Activity: Unknown  (04/07/2023)   Received from [facility]  Social Connections: Socially Isolated (03/10/2024)  Stress: Stress Concern Present (04/07/2023)   Received from [facility]  Tobacco Use: High Risk (03/13/2024)     Readmission Risk Interventions    03/13/2024    3:54 PM 03/04/2024    2:47 PM 01/14/2024    2:11 PM  Readmission Risk Prevention Plan  Transportation Screening Complete Complete Complete  Medication Review Oceanographer) Complete Complete Complete  PCP or Specialist appointment within 3-5 days of discharge Complete Complete Complete  HRI or Home Care Consult Complete Complete --  SW Recovery Care/Counseling Consult Complete Complete --  Palliative Care Screening Complete Not Applicable Not Applicable  Skilled Nursing Facility Not Applicable Not Applicable Not Applicable

## 2024-03-23 NOTE — Discharge Summary (Signed)
 Physician Discharge Summary  Tracy Park FMW:[id number] DOB: 07-20-1956 DOA: 03/09/2024  PCP: Corlis Longs, FNP  Admit date: 03/09/2024 Discharge date: 03/23/2024  Admitted From: Home  Discharge disposition: Skilled nursing facility   Recommendations for Outpatient Follow-Up:   Follow up with your primary care provider at the skilled nursing facility in 3 to 5 days Consider palliative care follow-up as outpatient.   Discharge Diagnosis:   Principal Problem:   Closed compression fracture of L3 vertebra (HCC) Active Problems:   Compression fracture of lumbar vertebra (HCC)   Lumbar compression fracture (HCC)   Discharge Condition: Improved.  Diet recommendation:  Regular.  Wound care: None.  Code status: DNR   History of Present Illness:   Tracy Park is a 68 y.o. female with a history of COPD, adenocarcinoma of the lung status post left upper lobe lobectomy, hypothyroidism, anxiety, PTSD, atrial fibrillation presented to the hospital secondary to back pain was found to have evidence of a new L3 compression fracture.  Patient started on analgesics, calcitonin, back brace for management.  Patient with continued intractable pain and now status post kyphoplasty 03/17/2024.  She needs to continue with physical and Occupational Therapy and pain control.   She has oxygen  2 L at home.  At this time patient wishing to go to short-term rehabilitation prior to going home    Hospital Course:   Following conditions were addressed during hospitalization as listed below,  L3/L4 compression fracture, s/p kyphoplasty by IR 7/9 -Continue lidocaine  patch, Flexeril , oxycodone .  On  gabapentin  to 300 mg 3 times daily from 03/22/2024 with better control of pain so we will continue the same dose on discharge.   COPD exacerbation Continue bronchodilators.  On home oxygen .  Will continue home oxygen  on discharge.   Chronic respiratory failure with hypoxia Patient is on 2 L/min of oxygen   which is her baseline.  Currently at baseline.     Essential hypertension - Continue losartan , metoprolol  succinate   Hypothyroidism - Continue Synthroid    History of lung cancer Patient has a history of left upper lobe lobectomy.  Patient was on home hospice and DNR.  Currently wishes to go to short-term rehab.  Seen by hospice during hospitalization.  Would recommend palliative care on discharge.   Paroxysmal atrial fibrillation Continue metoprolol  and Eliquis ,    Hyperlipidemia - Continue Crestor    Depression - Continue Zoloft    Disposition.  At this time, patient is stable for disposition to skilled nursing facility  Medical Consultants:   Interventional radiology hospice team   Procedures:    Kyphoplasty by interventional radiology  Subjective:   Today, patient was seen and examined at bedside.  States that she feels much better today with terms of her pain management and sitting up on the bed.  Discharge Exam:   Vitals:   03/23/24 0520 03/23/24 0853  BP: 122/74   Pulse: 91   Resp: 18   Temp: 98.1 F (36.7 C)   SpO2: 98% 94%   Vitals:   03/22/24 2207 03/23/24 0500 03/23/24 0520 03/23/24 0853  BP: 112/77  122/74   Pulse: 95  91   Resp: 18  18   Temp: 99.3 F (37.4 C)  98.1 F (36.7 C)   TempSrc:   Oral   SpO2: 95%  98% 94%  Weight:  49.7 kg    Height:        General: Alert awake, not in obvious distress thinly built, on nasal cannula oxygen , chronically ill and deconditioned.  HENT: pupils equally reacting to light,  No scleral pallor or icterus noted. Oral mucosa is moist.  Chest: Diminished breath sounds bilaterally.  Tenderness on the back. CVS: S1 &S2 heard. No murmur.  Regular rate and rhythm. Abdomen: Soft, nontender, nondistended.  Bowel sounds are heard.   Extremities: No cyanosis, clubbing or edema.  Peripheral pulses are palpable. Psych: Alert, awake and oriented, normal mood CNS:  No cranial nerve deficits.  Power equal in all  extremities.  Generalized weakness noted. Skin: Warm and dry.  No rashes noted.  The results of significant diagnostics from this hospitalization (including imaging, microbiology, ancillary and laboratory) are listed below for reference.     Diagnostic Studies:   CT Lumbar Spine Wo Contrast Result Date: 03/10/2024 CLINICAL DATA:  Low back pain, increased fracture risk. EXAM: CT LUMBAR SPINE WITHOUT CONTRAST TECHNIQUE: Multidetector CT imaging of the lumbar spine was performed without intravenous contrast administration. Multiplanar CT image reconstructions were also generated. RADIATION DOSE REDUCTION: This exam was performed according to the departmental dose-optimization program which includes automated exposure control, adjustment of the mA and/or kV according to patient size and/or use of iterative reconstruction technique. COMPARISON:  None Available. FINDINGS: Segmentation: Transitional lumbosacral anatomy. Two be consistent with the prior numbering system, there is a transitional vertebral body that is considered to be S1. Alignment: Grade II anterolisthesis of L5 on S1. Vertebrae: Similar L4 superior endplate fracture with approximately 25% height loss. Acute L3 superior endplate fracture with 25% height loss and trace bony retropulsion, new since June 9th. Osteopenia. Paraspinal and other soft tissues: Negative. Disc levels: Similar lower lumbar facet arthropathy. Disc bulge at L5-S1 with at least mild canal stenosis. IMPRESSION: 1. Acute L3 superior endplate fracture with 25% height loss and trace bony retropulsion, new since June 9th. 2. Similar L4 superior endplate fracture with 25% height loss. 3. Similar grade II anterolisthesis of L5 on S1. 4. Transitional lumbosacral anatomy. 5. Osteopenia. Electronically Signed   By: Gilmore GORMAN Molt M.D.   On: 03/10/2024 00:42   DG Chest Portable 1 View Result Date: 03/09/2024 CLINICAL DATA:  Shortness of breath.  History of lung cancer. EXAM: PORTABLE  CHEST 1 VIEW COMPARISON:  03/06/2024 FINDINGS: Postsurgical volume loss in the left hemithorax. Stable left apical pleuroparenchymal opacity. The heart is normal in size. Emphysema and bronchial thickening, unchanged from prior. Unchanged blunting of left costophrenic angle. No large effusion. No pneumothorax. On limited assessment, no acute osseous finding. IMPRESSION: 1. No acute findings. 2. Postsurgical volume loss in the left hemithorax. Stable left apical pleuroparenchymal opacity. 3. Emphysema and bronchial thickening. Electronically Signed   By: Andrea Gasman M.D.   On: 03/09/2024 20:08     Labs:   Basic Metabolic Panel: Recent Labs  Lab 03/17/24 0316 03/17/24 1753 03/18/24 0319 03/19/24 0316  NA 139 139  --  138  K 3.6 4.1  --  3.9  CL 100 98  --  97*  CO2 30 32  --  33*  GLUCOSE 107* 101*  --  115*  BUN 21 12  --  23  CREATININE 0.37* 0.39*  --  0.68  CALCIUM  8.7* 8.8*  --  8.6*  MG  --   --  2.0  --    GFR Estimated Creatinine Clearance: 55 mL/min (by C-G formula based on SCr of 0.68 mg/dL). Liver Function Tests: Recent Labs  Lab 03/17/24 1753  AST 22  ALT 26  ALKPHOS 107  BILITOT 0.5  PROT 6.4*  ALBUMIN 3.4*  No results for input(s): LIPASE, AMYLASE in the last 168 hours. No results for input(s): AMMONIA in the last 168 hours. Coagulation profile No results for input(s): INR, PROTIME in the last 168 hours.  CBC: Recent Labs  Lab 03/17/24 0316 03/18/24 0319 03/19/24 0316 03/20/24 0343  WBC 14.2* 15.2* 12.3* 10.5  HGB 10.7* 10.9* 10.3* 10.1*  HCT 34.7* 35.5* 34.7* 32.7*  MCV 95.1 95.4 96.1 94.5  PLT 216 200 181 184   Cardiac Enzymes: No results for input(s): CKTOTAL, CKMB, CKMBINDEX, TROPONINI in the last 168 hours. BNP: Invalid input(s): POCBNP CBG: No results for input(s): GLUCAP in the last 168 hours. D-Dimer No results for input(s): DDIMER in the last 72 hours. Hgb A1c No results for input(s): HGBA1C in the  last 72 hours. Lipid Profile No results for input(s): CHOL, HDL, LDLCALC, TRIG, CHOLHDL, LDLDIRECT in the last 72 hours. Thyroid  function studies No results for input(s): TSH, T4TOTAL, T3FREE, THYROIDAB in the last 72 hours.  Invalid input(s): FREET3 Anemia work up No results for input(s): VITAMINB12, FOLATE, FERRITIN, TIBC, IRON , RETICCTPCT in the last 72 hours. Microbiology No results found for this or any previous visit (from the past 240 hours).   Discharge Instructions:   Discharge Instructions     Diet - low sodium heart healthy   Complete by: As directed    Discharge instructions   Complete by: As directed    Continue physical therapy at the skilled nursing facility.  Continue oxygen . Follow-up with primary care provider at the skilled nursing facility in 3 to 5 days.  Seek medical attention for worsening symptoms. Consider followup with palliative care as outpatient.   Increase activity slowly   Complete by: As directed    Increase activity slowly   Complete by: As directed       Allergies as of 03/23/2024       Reactions   Red Dye #40 (allura Red) Hives, Itching, Other (See Comments)   Red food dye   Strawberry Extract Hives, Itching   Tomato Hives, Itching   Aspirin Hives   Tape Rash, Other (See Comments)   Prefers paper tape   Wound Dressing Adhesive Rash        Medication List     TAKE these medications    artificial tears ophthalmic solution Place 1 drop into the left eye in the morning, at noon, in the evening, and at bedtime.   Breztri  Aerosphere 160-9-4.8 MCG/ACT Aero inhaler Generic drug: budesonide -glycopyrrolate -formoterol  Inhale 2 puffs into the lungs in the morning, at noon, and at bedtime. What changed: when to take this   cyclobenzaprine  10 MG tablet Commonly known as: FLEXERIL  Take 1 tablet (10 mg total) by mouth 2 (two) times daily as needed for muscle spasms. What changed: when to take this    Eliquis  5 MG Tabs tablet Generic drug: apixaban  Take 1 tablet (5 mg total) by mouth 2 (two) times daily.   gabapentin  300 MG capsule Commonly known as: NEURONTIN  Take 1 capsule (300 mg total) by mouth 3 (three) times daily.   guaiFENesin  600 MG 12 hr tablet Commonly known as: Mucinex  Take 1 tablet (600 mg total) by mouth 2 (two) times daily.   ipratropium-albuterol  0.5-2.5 (3) MG/3ML Soln Commonly known as: DUONEB Take 3 mLs by nebulization 3 (three) times daily.   levothyroxine  125 MCG tablet Commonly known as: SYNTHROID  Take 125 mcg by mouth daily before breakfast.   lidocaine  5 % Commonly known as: Lidoderm  Place 1 patch onto the skin daily.  Remove & Discard patch within 12 hours or as directed by MD   losartan  25 MG tablet Commonly known as: COZAAR  Take 12.5 mg by mouth daily.   magnesium  oxide 400 (240 Mg) MG tablet Commonly known as: MAG-OX Take 400 mg by mouth 2 (two) times daily.   metoprolol  succinate 25 MG 24 hr tablet Commonly known as: TOPROL -XL Take 12.5 mg by mouth daily.   morphine  CONCENTRATE 10 mg / 0.5 ml concentrated solution Take 0.25 mLs (5 mg total) by mouth every 4 (four) hours as needed for severe pain (pain score 7-10). What changed: See the new instructions.   nitroGLYCERIN  0.4 MG SL tablet Commonly known as: NITROSTAT  Place 0.4 mg under the tongue every 5 (five) minutes as needed for chest pain.   oxyCODONE  5 MG immediate release tablet Commonly known as: Roxicodone  Take 1 tablet (5 mg total) by mouth every 4 (four) hours as needed for severe pain (pain score 7-10) or breakthrough pain. What changed: reasons to take this   OXYGEN  Inhale 2 L/min into the lungs continuous.   prednisoLONE  acetate 1 % ophthalmic suspension Commonly known as: PRED FORTE  Place 1 drop into the left eye 4 (four) times daily.   predniSONE  10 MG tablet Commonly known as: DELTASONE  Take 4 pills daily for 2 days then 3 pills daily for 3 days then 2 pills  daily for 3 days then continue taking 1 pill daily   rosuvastatin  20 MG tablet Commonly known as: CRESTOR  Take 20 mg by mouth daily.   sertraline  25 MG tablet Commonly known as: ZOLOFT  Take 25 mg by mouth in the morning.   Ventolin  HFA 108 (90 Base) MCG/ACT inhaler Generic drug: albuterol  Inhale 2 puffs into the lungs every 6 (six) hours as needed for wheezing or shortness of breath.   Voltaren Arthritis Pain 1 % Gel Generic drug: diclofenac  Sodium Apply 1 Application topically as needed (pain).        Contact information for after-discharge care     Destination     [facility] .   Service: Skilled Nursing Contact information: [address] [place]  [address] [phone]                      Time coordinating discharge: 39 minutes  Signed:  Sabreena Vogan  [facility] 03/23/2024, 10:17 AM

## 2024-03-23 NOTE — Plan of Care (Signed)
  Problem: Clinical Measurements: Goal: Respiratory complications will improve Outcome: Progressing   Problem: Nutrition: Goal: Adequate nutrition will be maintained Outcome: Progressing   Problem: Pain Managment: Goal: General experience of comfort will improve and/or be controlled Outcome: Progressing

## 2024-03-23 NOTE — Plan of Care (Signed)
 Patient plan of care and knowledge adequate for discharge

## 2024-03-23 NOTE — Progress Notes (Signed)
 Patient has been discharged, and en route to SNF via [facility]

## 2024-04-10 ENCOUNTER — Emergency Department (HOSPITAL_COMMUNITY)

## 2024-04-10 ENCOUNTER — Emergency Department (HOSPITAL_COMMUNITY)
Admission: EM | Admit: 2024-04-10 | Discharge: 2024-04-12 | Disposition: A | Discharge status: Rehabilitation Facility | Attending: Emergency Medicine | Admitting: Emergency Medicine

## 2024-04-10 DIAGNOSIS — S32020A Wedge compression fracture of second lumbar vertebra, initial encounter for closed fracture: Secondary | ICD-10-CM | POA: Insufficient documentation

## 2024-04-10 DIAGNOSIS — X58XXXA Exposure to other specified factors, initial encounter: Secondary | ICD-10-CM | POA: Diagnosis not present

## 2024-04-10 DIAGNOSIS — S3992XA Unspecified injury of lower back, initial encounter: Secondary | ICD-10-CM | POA: Diagnosis present

## 2024-04-10 DIAGNOSIS — R000 Tachycardia, unspecified: Secondary | ICD-10-CM | POA: Diagnosis not present

## 2024-04-10 DIAGNOSIS — J449 Chronic obstructive pulmonary disease, unspecified: Secondary | ICD-10-CM | POA: Insufficient documentation

## 2024-04-10 DIAGNOSIS — Z7901 Long term (current) use of anticoagulants: Secondary | ICD-10-CM | POA: Insufficient documentation

## 2024-04-10 LAB — COMPREHENSIVE METABOLIC PANEL WITH GFR
ALT: 14 U/L (ref 0–44)
AST: 19 U/L (ref 15–41)
Albumin: 2.9 g/dL — ABNORMAL LOW (ref 3.5–5.0)
Alkaline Phosphatase: 102 U/L (ref 38–126)
Anion gap: 9 (ref 5–15)
BUN: 14 mg/dL (ref 8–23)
CO2: 19 mmol/L — ABNORMAL LOW (ref 22–32)
Calcium: 7.6 mg/dL — ABNORMAL LOW (ref 8.9–10.3)
Chloride: 114 mmol/L — ABNORMAL HIGH (ref 98–111)
Creatinine, Ser: 0.39 mg/dL — ABNORMAL LOW (ref 0.44–1.00)
GFR, Estimated: >60 mL/min (ref >60)
Glucose, Bld: 90 mg/dL (ref 70–99)
Potassium: 3.4 mmol/L — ABNORMAL LOW (ref 3.5–5.1)
Sodium: 142 mmol/L (ref 135–145)
Total Bilirubin: 0.8 mg/dL (ref 0.0–1.2)
Total Protein: 6 g/dL — ABNORMAL LOW (ref 6.5–8.1)

## 2024-04-10 LAB — URINALYSIS, ROUTINE W REFLEX MICROSCOPIC
Bilirubin Urine: NEGATIVE
Glucose, UA: NEGATIVE mg/dL
Hgb urine dipstick: NEGATIVE
Ketones, ur: 20 mg/dL — AB
Leukocytes,Ua: NEGATIVE
Nitrite: NEGATIVE
Protein, ur: NEGATIVE mg/dL
Specific Gravity, Urine: 1.017 (ref 1.005–1.030)
pH: 6 (ref 5.0–8.0)

## 2024-04-10 LAB — CBC WITH DIFFERENTIAL/PLATELET
Abs Immature Granulocytes: 0.11 K/uL — ABNORMAL HIGH (ref 0.00–0.07)
Basophils Absolute: 0.1 K/uL (ref 0.0–0.1)
Basophils Relative: 0 %
Eosinophils Absolute: 0 K/uL (ref 0.0–0.5)
Eosinophils Relative: 0 %
HCT: 40.6 % (ref 36.0–46.0)
Hemoglobin: 11.8 g/dL — ABNORMAL LOW (ref 12.0–15.0)
Immature Granulocytes: 1 %
Lymphocytes Relative: 19 %
Lymphs Abs: 2.3 K/uL (ref 0.7–4.0)
MCH: 28.9 pg (ref 26.0–34.0)
MCHC: 29.1 g/dL — ABNORMAL LOW (ref 30.0–36.0)
MCV: 99.3 fL (ref 80.0–100.0)
Monocytes Absolute: 0.9 K/uL (ref 0.1–1.0)
Monocytes Relative: 8 %
Neutro Abs: 8.3 K/uL — ABNORMAL HIGH (ref 1.7–7.7)
Neutrophils Relative %: 72 %
Platelets: 182 K/uL (ref 150–400)
RBC: 4.09 MIL/uL (ref 3.87–5.11)
RDW: 14.3 % (ref 11.5–15.5)
WBC: 11.6 K/uL — ABNORMAL HIGH (ref 4.0–10.5)
nRBC: 0 % (ref 0.0–0.2)

## 2024-04-10 LAB — SARS CORONAVIRUS 2 BY RT PCR: SARS Coronavirus 2 by RT PCR: NEGATIVE

## 2024-04-10 MED ORDER — CYCLOBENZAPRINE HCL 10 MG PO TABS
10.0000 mg | ORAL_TABLET | Freq: Every day | ORAL | Status: DC
  Administered 2024-04-10 – 2024-04-12 (×3): 10 mg via ORAL
  Filled 2024-04-10 (×3): qty 1

## 2024-04-10 MED ORDER — LOSARTAN POTASSIUM 25 MG PO TABS
12.5000 mg | ORAL_TABLET | Freq: Every day | ORAL | Status: DC
  Administered 2024-04-10 – 2024-04-12 (×3): 12.5 mg via ORAL
  Filled 2024-04-10 (×3): qty 1

## 2024-04-10 MED ORDER — MORPHINE SULFATE (PF) 4 MG/ML IV SOLN
4.0000 mg | Freq: Once | INTRAVENOUS | Status: AC
  Administered 2024-04-10: 4 mg via INTRAVENOUS
  Filled 2024-04-10: qty 1

## 2024-04-10 MED ORDER — LEVOTHYROXINE SODIUM 25 MCG PO TABS
125.0000 ug | ORAL_TABLET | Freq: Every day | ORAL | Status: DC
  Administered 2024-04-11 – 2024-04-12 (×2): 125 ug via ORAL
  Filled 2024-04-10 (×2): qty 1

## 2024-04-10 MED ORDER — GABAPENTIN 300 MG PO CAPS
300.0000 mg | ORAL_CAPSULE | Freq: Three times a day (TID) | ORAL | Status: DC
  Administered 2024-04-10 – 2024-04-12 (×5): 300 mg via ORAL
  Filled 2024-04-10 (×5): qty 1

## 2024-04-10 MED ORDER — METOPROLOL SUCCINATE ER 25 MG PO TB24
12.5000 mg | ORAL_TABLET | Freq: Every day | ORAL | Status: DC
  Administered 2024-04-10 – 2024-04-12 (×3): 12.5 mg via ORAL
  Filled 2024-04-10 (×3): qty 1

## 2024-04-10 MED ORDER — SODIUM CHLORIDE 0.9 % IV BOLUS
1000.0000 mL | Freq: Once | INTRAVENOUS | Status: AC
  Administered 2024-04-10: 1000 mL via INTRAVENOUS

## 2024-04-10 MED ORDER — MAGNESIUM OXIDE -MG SUPPLEMENT 400 (240 MG) MG PO TABS
400.0000 mg | ORAL_TABLET | Freq: Two times a day (BID) | ORAL | Status: DC
  Administered 2024-04-10 – 2024-04-12 (×4): 400 mg via ORAL
  Filled 2024-04-10 (×4): qty 1

## 2024-04-10 MED ORDER — MORPHINE SULFATE (PF) 4 MG/ML IV SOLN
4.0000 mg | INTRAVENOUS | Status: DC | PRN
  Administered 2024-04-10 – 2024-04-12 (×7): 4 mg via INTRAVENOUS
  Filled 2024-04-10 (×7): qty 1

## 2024-04-10 NOTE — ED Notes (Signed)
 Pt unable to give urine sample at this time

## 2024-04-10 NOTE — ED Provider Notes (Signed)
  EMERGENCY DEPARTMENT AT [facility] Provider Note   CSN: [id number] Arrival date & time: 04/10/24  1432     Patient presents with: Shortness of Breath and Back Pain   Tracy Park is a 68 y.o. female.   HPI Patient is a from discharge facility about 2 weeks ago, now with concern for left lateral abdominal and left flank pain as well as fatigue.  Patient's history is notable for COPD, on home oxygen , and fall with L3 compression fracture now status post kyphoplasty.  She notes that she was discharged from rehab a few days ago, over the past 24 hours has developed severe pain.  No improvement with Tylenol .    Prior to Admission medications   Medication Sig Start Date End Date Taking? Authorizing Provider  albuterol  (VENTOLIN  HFA) 108 (90 Base) MCG/ACT inhaler Inhale 2 puffs into the lungs every 6 (six) hours as needed for wheezing or shortness of breath. 03/08/24   Jillian Buttery, MD  apixaban  (ELIQUIS ) 5 MG TABS tablet Take 1 tablet (5 mg total) by mouth 2 (two) times daily. Patient not taking: Reported on 03/03/2024 06/06/23   Barbarann Nest, MD  budesonide -glycopyrrolate -formoterol  (BREZTRI  AEROSPHERE) 160-9-4.8 MCG/ACT AERO inhaler Inhale 2 puffs into the lungs in the morning, at noon, and at bedtime. Patient taking differently: Inhale 2 puffs into the lungs in the morning and at bedtime. 03/08/24   Jillian Buttery, MD  cyclobenzaprine  (FLEXERIL ) 10 MG tablet Take 1 tablet (10 mg total) by mouth 2 (two) times daily as needed for muscle spasms. Patient taking differently: Take 10 mg by mouth daily. 02/10/24   Dreama Longs, MD  gabapentin  (NEURONTIN ) 300 MG capsule Take 1 capsule (300 mg total) by mouth 3 (three) times daily. 03/23/24   Pokhrel, Laxman, MD  guaiFENesin  (MUCINEX ) 600 MG 12 hr tablet Take 1 tablet (600 mg total) by mouth 2 (two) times daily. 03/08/24 03/08/25  Jillian Buttery, MD  ipratropium-albuterol  (DUONEB) 0.5-2.5 (3) MG/3ML SOLN Take 3 mLs by  nebulization 3 (three) times daily. 02/29/24   [provider]  levothyroxine  (SYNTHROID ) 125 MCG tablet Take 125 mcg by mouth daily before breakfast.    [provider]  lidocaine  (LIDODERM ) 5 % Place 1 patch onto the skin daily. Remove & Discard patch within 12 hours or as directed by MD 02/10/24   Dreama Longs, MD  losartan  (COZAAR ) 25 MG tablet Take 12.5 mg by mouth daily. 02/09/24   [provider]  magnesium  oxide (MAG-OX) 400 (240 Mg) MG tablet Take 400 mg by mouth 2 (two) times daily.    [provider]  metoprolol  succinate (TOPROL -XL) 25 MG 24 hr tablet Take 12.5 mg by mouth daily.    [provider]  Morphine  Sulfate (MORPHINE  CONCENTRATE) 10 mg / 0.5 ml concentrated solution Take 0.25 mLs (5 mg total) by mouth every 4 (four) hours as needed for severe pain (pain score 7-10). 03/23/24 03/23/25  Pokhrel, Vernal, MD  nitroGLYCERIN  (NITROSTAT ) 0.4 MG SL tablet Place 0.4 mg under the tongue every 5 (five) minutes as needed for chest pain.    [provider]  oxyCODONE  (ROXICODONE ) 5 MG immediate release tablet Take 1 tablet (5 mg total) by mouth every 4 (four) hours as needed for severe pain (pain score 7-10) or breakthrough pain. 03/23/24 03/23/25  Pokhrel, Vernal, MD  OXYGEN  Inhale 2 L/min into the lungs continuous.    [provider]  polyvinyl alcohol  (LIQUIFILM TEARS) 1.4 % ophthalmic solution Place 1 drop into the  left eye in the morning, at noon, in the evening, and at bedtime.    [provider]  prednisoLONE  acetate (PRED FORTE ) 1 % ophthalmic suspension Place 1 drop into the left eye 4 (four) times daily. 11/19/23   [provider]  predniSONE  (DELTASONE ) 10 MG tablet Take 4 pills daily for 2 days then 3 pills daily for 3 days then 2 pills daily for 3 days then continue taking 1 pill daily Patient not taking: Reported on 03/10/2024 03/09/24   Jillian Buttery, MD  rosuvastatin  (CRESTOR ) 20 MG tablet Take 20 mg by  mouth daily.    [provider]  sertraline  (ZOLOFT ) 25 MG tablet Take 25 mg by mouth in the morning.    [provider]  VOLTAREN ARTHRITIS PAIN 1 % GEL Apply 1 Application topically as needed (pain). Patient not taking: Reported on 03/10/2024 03/09/24   [provider]    Allergies: Red dye #40 (allura red), Strawberry extract, Tomato, Aspirin, Tape, and Wound dressing adhesive    Review of Systems  Updated Vital Signs BP (!) 152/96   Pulse 95   Temp 97.8 F (36.6 C)   Resp 20   SpO2 97%   Physical Exam Vitals and nursing note reviewed.  Constitutional:      Appearance: She is well-developed. She is ill-appearing.  HENT:     Head: Normocephalic and atraumatic.  Eyes:     Conjunctiva/sclera: Conjunctivae normal.  Cardiovascular:     Rate and Rhythm: Regular rhythm. Tachycardia present.  Pulmonary:     Effort: Pulmonary effort is normal. No respiratory distress.     Breath sounds: Normal breath sounds. No stridor.  Abdominal:     General: There is no distension.     Comments: Tenderness left lateral abdomen with guarding, no skin changes  Skin:    General: Skin is warm and dry.  Neurological:     Mental Status: She is alert and oriented to person, place, and time.     Cranial Nerves: No cranial nerve deficit.  Psychiatric:        Mood and Affect: Mood normal.     (all labs ordered are listed, but only abnormal results are displayed) Labs Reviewed  COMPREHENSIVE METABOLIC PANEL WITH GFR - Abnormal; Notable for the following components:      Result Value   Potassium 3.4 (*)    Chloride 114 (*)    CO2 19 (*)    Creatinine, Ser 0.39 (*)    Calcium  7.6 (*)    Total Protein 6.0 (*)    Albumin 2.9 (*)    All other components within normal limits  CBC WITH DIFFERENTIAL/PLATELET - Abnormal; Notable for the following components:   WBC 11.6 (*)    Hemoglobin 11.8 (*)    MCHC 29.1 (*)    Neutro Abs 8.3 (*)    Abs Immature Granulocytes 0.11 (*)     All other components within normal limits  SARS CORONAVIRUS 2 BY RT PCR  URINALYSIS, ROUTINE W REFLEX MICROSCOPIC  BRAIN NATRIURETIC PEPTIDE    EKG: EKG Interpretation Date/Time:  Saturday April 10 2024 14:44:43 EDT Ventricular Rate:  113 PR Interval:  118 QRS Duration:  100 QT Interval:  340 QTC Calculation: 467 R Axis:   0  Text Interpretation: Sinus tachycardia Multiple ventricular premature complexes Right atrial enlargement Markedly posterior QRS axis Artifact Confirmed by Garrick Charleston [id number]) on 04/10/2024 2:52:14 PM  Radiology: CT L-SPINE NO CHARGE Result Date: 04/10/2024 CLINICAL DATA:  Left lower  back pain, recent kyphoplasty EXAM: CT LUMBAR SPINE WITHOUT CONTRAST TECHNIQUE: Multidetector CT imaging of the lumbar spine was performed without intravenous contrast administration. Multiplanar CT image reconstructions were also generated. RADIATION DOSE REDUCTION: This exam was performed according to the departmental dose-optimization program which includes automated exposure control, adjustment of the mA and/or kV according to patient size and/or use of iterative reconstruction technique. COMPARISON:  03/09/2024, 03/11/2024 FINDINGS: Segmentation: Utilizing the numbering convention of the prior exam, 5 non-rib-bearing lumbar type vertebral bodies will be assumed with the last complete disc space designated as L5/S1. Alignment: There is grade 1/2 anterolisthesis of L5 on S1, with no evidence of spondylolysis. Otherwise alignment is anatomic. Vertebrae: Chronic compression deformities at L3 and L4 are again noted, with interval vertebral augmentation. No evidence of complication. There is a new compression deformity involving the superior endplate of the L2 vertebral body, with less than 10% loss of vertebral body height and no retropulsion. Chronic compression deformity of the superior endplate of the T12 vertebral body is unchanged. Paraspinal and other soft tissues: Paraspinal soft  tissues are unremarkable. Disc levels: At the L4-5 level there is mild circumferential disc bulge with bilateral facet hypertrophy resulting in minimal symmetrical bilateral neural foraminal encroachment. At the L5-S1 level there is moderate central canal stenosis due to uncovering of the disc related to the anterolisthesis described above, circumferential disc bulge, and significant bilateral facet and ligamentum flavum hypertrophy. There is severe bilateral symmetrical neural foraminal encroachment as well. Reconstructed images demonstrate no additional findings. IMPRESSION: 1. New acute to subacute mild compression deformity within the superior endplate of the L2 vertebral body, with less than 10% loss of vertebral body height. 2. Chronic compression deformities of T12, L3, and L4, with interval vertebral augmentation at the L3 and L4 levels. No evidence of complication. 3. Degenerative grade 1/2 anterolisthesis of L5 on S1, with associated significant central canal and bilateral neural foraminal encroachment as above. Electronically Signed   By: Ozell Daring M.D.   On: 04/10/2024 15:48   DG Chest Port 1 View Result Date: 04/10/2024 CLINICAL DATA:  Pneumonia, lower left back pain, wheezing, and shortness of breath. EXAM: PORTABLE CHEST 1 VIEW COMPARISON:  03/09/2024. FINDINGS: The heart size and mediastinal contours are stable. There are stable postsurgical changes, volume loss, and opacity at the left lung apex. The right lung is clear. No effusion or pneumothorax is seen. Cervical spinal fusion hardware is noted. No acute osseous abnormality. IMPRESSION: Stable chest with no active disease. Electronically Signed   By: Leita Birmingham M.D.   On: 04/10/2024 15:46   CT Renal Stone Study Result Date: 04/10/2024 CLINICAL DATA:  Left flank pain, left lower back pain, recent kyphoplasty EXAM: CT ABDOMEN AND PELVIS WITHOUT CONTRAST TECHNIQUE: Multidetector CT imaging of the abdomen and pelvis was performed  following the standard protocol without IV contrast. RADIATION DOSE REDUCTION: This exam was performed according to the departmental dose-optimization program which includes automated exposure control, adjustment of the mA and/or kV according to patient size and/or use of iterative reconstruction technique. COMPARISON:  03/11/2024, 03/09/2024, 02/16/2024 FINDINGS: Lower chest: Stable emphysema. No acute pleural or parenchymal lung disease. Hepatobiliary: Unremarkable unenhanced appearance of the liver and gallbladder. No biliary duct dilation. Pancreas: Unremarkable unenhanced appearance. Spleen: Unremarkable unenhanced appearance. Adrenals/Urinary Tract: Stable nonobstructing 3 mm calculus lower pole right kidney. No left-sided calculi. No obstructive uropathy within either kidney. The adrenals and bladder appear unremarkable. Stomach/Bowel: No bowel obstruction or ileus. No bowel wall thickening or inflammatory change. Vascular/Lymphatic:  Aortic atherosclerosis. No enlarged abdominal or pelvic lymph nodes. Reproductive: Status post hysterectomy. No adnexal masses. Other: No free fluid or free intraperitoneal gas. No abdominal wall hernia. Musculoskeletal: Chronic compression deformities at L3 and L4 are again noted, with interval vertebral augmentation. No evidence of complication. Since the prior CT and MRI, a new compression deformity has developed within the superior endplate of the L2 vertebral body, with estimated less than 10% loss of vertebral body height. Chronic mild compression deformity of the superior endplate of the T12 vertebral body is unchanged. Stable grade 1 anterolisthesis of L5 on S1 with severe lower lumbar facet hypertrophy unchanged. Reconstructed images demonstrate no additional findings. IMPRESSION: 1. New mild compression deformity within the superior endplate of the L2 vertebral body, with less than 10% loss of vertebral body height. 2. Chronic L3 and L4 compression deformities with  interval vertebral augmentations. No evidence of acute complication. 3. Nonobstructing 3 mm right renal calculus. No obstructive uropathy. 4. Aortic Atherosclerosis (ICD10-I70.0) and Emphysema (ICD10-J43.9). Electronically Signed   By: Ozell Daring M.D.   On: 04/10/2024 15:44     Procedures   Medications Ordered in the ED  cyclobenzaprine  (FLEXERIL ) tablet 10 mg (has no administration in time range)  gabapentin  (NEURONTIN ) capsule 300 mg (has no administration in time range)  levothyroxine  (SYNTHROID ) tablet 125 mcg (has no administration in time range)  losartan  (COZAAR ) tablet 12.5 mg (has no administration in time range)  magnesium  oxide (MAG-OX) tablet 400 mg (has no administration in time range)  metoprolol  succinate (TOPROL -XL) 24 hr tablet 12.5 mg (has no administration in time range)  morphine  (PF) 4 MG/ML injection 4 mg (has no administration in time range)  sodium chloride  0.9 % bolus 1,000 mL (1,000 mLs Intravenous Bolus 04/10/24 1601)  morphine  (PF) 4 MG/ML injection 4 mg (4 mg Intravenous Given 04/10/24 1601)  morphine  (PF) 4 MG/ML injection 4 mg (4 mg Intravenous Given 04/10/24 1733)                                    Medical Decision Making Elderly female, advanced COPD now presents with left lateral abdominal pain.  Patient also history of recent kyphoplasty, differential including intra-abdominal process, complications of kyphoplasty, nephrolithiasis.  Patient is mildly tachycardic, but not hypotensive, nor febrile, meets SIRS criteria, this may be secondary to pain.  Oxygen  saturation 94% on 2 L nasal cannula abnormal Cardiac 110 sinus tach abnormal   Amount and/or Complexity of Data Reviewed External Data Reviewed: notes. Labs: ordered. Decision-making details documented in ED Course. Radiology: ordered and independent interpretation performed. Decision-making details documented in ED Course. ECG/medicine tests: ordered and independent interpretation performed.  Decision-making details documented in ED Course.  Risk Prescription drug management.   Update: Patient with evidence for new compression fracture L2, this is occurred in spite of using a brace, and taking medication as prescribed. Reviewed findings at bedside with patient.  She mains hemodynamically unremarkable, no other findings are reassuring. Given severe pain in spite of her home medication use, brace, in the context of recent kyphoplasty, patient request to return to her rehabilitation placement, social work consult has been placed, patient with home orders redone, holding orders placed as well.      Final diagnoses:  Closed compression fracture of L2 lumbar vertebra, initial encounter [facility])     Garrick Charleston, MD 04/10/24 1845

## 2024-04-10 NOTE — ED Triage Notes (Signed)
 Patient BIB EMS for lower left back pain, and shortness of breath. Hx of COPD. ON Oxygen  at home 2 l/[place]. Wheezing en route, and given Duoneb by EMS.      Per EMS:  20g L FA 1 duo neb given by EMS 130/89 104 26

## 2024-04-11 DIAGNOSIS — S32020A Wedge compression fracture of second lumbar vertebra, initial encounter for closed fracture: Secondary | ICD-10-CM | POA: Diagnosis not present

## 2024-04-11 MED ORDER — OXYCODONE-ACETAMINOPHEN 5-325 MG PO TABS
1.0000 | ORAL_TABLET | Freq: Once | ORAL | Status: AC
  Administered 2024-04-11: 1 via ORAL
  Filled 2024-04-11: qty 1

## 2024-04-11 NOTE — ED Notes (Signed)
 PT at bedside.

## 2024-04-11 NOTE — ED Notes (Signed)
 Set pt up for breakfast

## 2024-04-11 NOTE — Evaluation (Signed)
 Physical Therapy Evaluation Patient Details Name: Tracy Park MRN: [id number] DOB: 1956/02/19 Today's Date: 04/11/2024  History of Present Illness  Pt readmitted from home back pain and with new L-2 compression fx.  Pt recently returned home from [facility] following rehab for L3 compression fx s/p kyphoplasty.  Pt s/p L3/4 kyphoplasties on 03/17/24.  Previous recent admission on 03/02/24 for Acute COPD exacerbation.  Past medical history of hypertension, depression, anxiety, paroxysmal A-fib on Eliquis , lung cancer status post lumpectomy, severe COPD with multiple admissions now enrolled with outpatient hospice, chronic hypoxic/hypercarbic respiratory failure.  Clinical Impression  Pt admitted as above and presenting with functional mobility limitations 2* generalized weakness, balance deficits, limited activity tolerance and pain exacerbation with new L-2 compression fx.  Patient will benefit from continued inpatient follow up therapy, <3 hours/day to maximize IND and safety prior to return home with limited assist.       If plan is discharge home, recommend the following: A little help with walking and/or transfers;A little help with bathing/dressing/bathroom;Assistance with cooking/housework;Assist for transportation;Help with stairs or ramp for entrance   Can travel by private vehicle   No    Equipment Recommendations None recommended by PT  Recommendations for Other Services       Functional Status Assessment Patient has had a recent decline in their functional status and demonstrates the ability to make significant improvements in function in a reasonable and predictable amount of time.     Precautions / Restrictions Precautions Precautions: Fall;Back Recall of Precautions/Restrictions: Intact Precaution/Restrictions Comments: 2L O2 [place] baseline Required Braces or Orthoses: Spinal Brace (Pt reports brace is not with her in hospital but it is as needed for  comfort) Restrictions Weight Bearing Restrictions Per Provider Order: No      Mobility  Bed Mobility Overal bed mobility: Needs Assistance Bed Mobility: Supine to Sit     Supine to sit: HOB elevated, Min assist Sit to supine: Min assist, HOB elevated   General bed mobility comments: increased time and effort with greater difficulty due to back pain    Transfers Overall transfer level: Needs assistance Equipment used: Rolling walker (2 wheels) Transfers: Sit to/from Stand Sit to Stand: Min assist, Contact guard assist           General transfer comment: Steady assist from elevated surface    Ambulation/Gait Ambulation/Gait assistance: Min assist Gait Distance (Feet): 54 Feet Assistive device: Rolling walker (2 wheels) Gait Pattern/deviations: Step-through pattern, Decreased stride length, Narrow base of support Gait velocity: decr     General Gait Details: Increased time with standing rest breaks, cues for position from RW and steady assist  Stairs            Wheelchair Mobility     Tilt Bed    Modified Rankin (Stroke Patients Only)       Balance Overall balance assessment: Mild deficits observed, not formally tested                                           Pertinent Vitals/Pain Pain Assessment Pain Assessment: 0-10 Pain Score: 8  Pain Location: back pain Pain Descriptors / Indicators: Aching, Sore, Cramping Pain Intervention(s): Limited activity within patient's tolerance, Monitored during session, Premedicated before session    Home Living Family/patient expects to be discharged to:: Skilled nursing facility   Available Help at Discharge: Family;Available PRN/intermittently;Friend(s) Type of Home: Mobile  home Home Access: Stairs to enter Entrance Stairs-Rails: Right;Left;Can reach both Entrance Stairs-Number of Steps: 3-4   Home Layout: One level Home Equipment: Cane - single point;Rollator (4 wheels);Shower  seat;Grab bars - tub/shower;Hand held shower head Additional Comments: On 2 L O2 at home 24/7    Prior Function Prior Level of Function : Independent/Modified Independent             Mobility Comments: In home ambulates with rollator ADLs Comments: Ind with ADLs/selfcare, IADLs, home mgt, cooking, no longer drives; can go out in community but typically family does shopping     Extremity/Trunk Assessment   Upper Extremity Assessment Upper Extremity Assessment: Generalized weakness    Lower Extremity Assessment Lower Extremity Assessment: Generalized weakness    Cervical / Trunk Assessment Cervical / Trunk Assessment: Kyphotic  Communication   Communication Communication: No apparent difficulties    Cognition Arousal: Alert Behavior During Therapy: WFL for tasks assessed/performed   PT - Cognitive impairments: No apparent impairments                       PT - Cognition Comments: AxO x 3 pleasant ans motivated.  Home oxygen  2 lts almost a year COPD Following commands: Intact       Cueing Cueing Techniques: Verbal cues     General Comments      Exercises     Assessment/Plan    PT Assessment Patient needs continued PT services  PT Problem List Decreased strength;Decreased mobility;Decreased safety awareness;Decreased knowledge of precautions;Cardiopulmonary status limiting activity;Pain       PT Treatment Interventions DME instruction;Therapeutic activities;Gait training;Therapeutic exercise;Patient/family education;Functional mobility training    PT Goals (Current goals can be found in the Care Plan section)  Acute Rehab PT Goals Patient Stated Goal: go home PT Goal Formulation: With patient Time For Goal Achievement: 04/25/24 Potential to Achieve Goals: Good    Frequency Min 3X/week     Co-evaluation               AM-PAC PT 6 Clicks Mobility  Outcome Measure Help needed turning from your back to your side while in a flat bed  without using bedrails?: A Little Help needed moving from lying on your back to sitting on the side of a flat bed without using bedrails?: A Little Help needed moving to and from a bed to a chair (including a wheelchair)?: A Little Help needed standing up from a chair using your arms (e.g., wheelchair or bedside chair)?: A Little Help needed to walk in hospital room?: A Little Help needed climbing 3-5 steps with a railing? : A Lot 6 Click Score: 17    End of Session Equipment Utilized During Treatment: Gait belt;Oxygen  Activity Tolerance: Patient limited by fatigue;Patient limited by pain Patient left: in bed;with call bell/phone within reach Nurse Communication: Mobility status PT Visit Diagnosis: Muscle weakness (generalized) (M62.81);Difficulty in walking, not elsewhere classified (R26.2)    Time: 1547-1610 PT Time Calculation (min) (ACUTE ONLY): 23 min   Charges:   PT Evaluation $PT Eval Low Complexity: 1 Low   PT General Charges $$ ACUTE PT VISIT: 1 Visit         [facility] PT Acute Rehabilitation Services Office [phone]   Oakes Mccready 04/11/2024, 5:12 PM

## 2024-04-11 NOTE — ED Provider Notes (Signed)
 Emergency Medicine Observation Re-evaluation Note  Maryjo L Hussein is a 68 y.o. female, seen on rounds today.  Pt initially presented to the ED for complaints of Shortness of Breath and Back Pain Currently, the patient is resting .  Physical Exam  BP (!) 146/88   Pulse 93   Temp 98.7 F (37.1 C) (Axillary)   Resp (!) 23   SpO2 96%  Physical Exam General: nad   ED Course / MDM  EKG:EKG Interpretation Date/Time:  Saturday April 10 2024 14:44:43 EDT Ventricular Rate:  113 PR Interval:  118 QRS Duration:  100 QT Interval:  340 QTC Calculation: 467 R Axis:   0  Text Interpretation: Sinus tachycardia Multiple ventricular premature complexes Right atrial enlargement Markedly posterior QRS axis Artifact Confirmed by Garrick Charleston [phone]) on 04/10/2024 2:52:14 PM  I have reviewed the labs performed to date as well as medications administered while in observation.  Recent changes in the last 24 hours include no changes .  Plan  Current plan is for patient is a new L2 fracture.  Patient is in a brace.  Given pain, she is asking for placement back to rehab facility.  Social work consult has been placed.  PT consult has been placed.SABRA Simon Lavonia LOISE, MD 04/11/24 [phone]

## 2024-04-11 NOTE — Progress Notes (Signed)
 CSW received a consult for SNF placement. Pt was recently discharged from [facility] following short-term rehab stay and has now returned with a new fracture, per the MD.  CSW spoke with Darrain from [facility]. Pt was discharged home last week with [facility] in place. Darrain reported that she will need to check with her business office manager on how many Medicare days the pt has remaining before entering copay days.  CSW also spoke with the on-call liaison, Leanne, who verified that [facility] is actively following the pt. She stated that the pt would need to revoke hospice services in order to pursue short-term rehab. She reports they will follow up with pt.  CSW met with the pt at bedside to discuss the consult. Pt reported she was discharged from [facility] on Thursday or Friday of last week and expressed interest in returning there for short-term rehab. CSW explained that in order to proceed with SNF placement, she must revoke hospice services, obtain PT recommendations, and secure insurance authorization. CSW also informed the pt that she will enter her copay days if approved for SNF placement. Pt confirmed she would like to return to [facility] for SNF placement. Pt Awaiting PT recommendations. Care management to follow.   Tawni HERO.Ayumi Wangerin, MSW, LCSW [place] [facility]  IP Care Management  Clinical Social Worker II Direct Dial: [phone]  Fax: [phone] [email or web address]

## 2024-04-12 DIAGNOSIS — S32020A Wedge compression fracture of second lumbar vertebra, initial encounter for closed fracture: Secondary | ICD-10-CM | POA: Diagnosis not present

## 2024-04-12 NOTE — ED Provider Notes (Signed)
 Emergency Medicine Observation Re-evaluation Note  Tracy Park is a 68 y.o. female, seen on rounds today.  Pt initially presented to the ED for complaints of Shortness of Breath and Back Pain Currently, the patient is resting.  Physical Exam  BP (!) 131/92 (BP Location: Left Arm)   Pulse 99   Temp 98.3 F (36.8 C) (Oral)   Resp (!) 22   SpO2 100%  Physical Exam General: nad   ED Course / MDM  EKG:EKG Interpretation Date/Time:  Saturday April 10 2024 16:06:23 EDT Ventricular Rate:  96 PR Interval:  133 QRS Duration:  102 QT Interval:  373 QTC Calculation: 472 R Axis:   -49  Text Interpretation: Sinus rhythm Atrial premature complex Right atrial enlargement RSR' in V1 or V2, probably normal variant Left ventricular hypertrophy Nonspecific T abnrm, anterolateral leads Confirmed by Towana Sharper [phone]) on 04/11/2024 10:15:35 AM  I have reviewed the labs performed to date as well as medications administered while in observation.  Recent changes in the last 24 hours include plan for dc to [facility].  Plan  Current plan is for dc.    Elnor Jayson LABOR, DO 04/12/24 [phone]

## 2024-04-12 NOTE — Discharge Instructions (Signed)
 It was a pleasure caring for you today in the emergency department.  Please return to the emergency department for any worsening or worrisome symptoms.

## 2024-04-12 NOTE — ED Notes (Signed)
 Set pt up for breakfast

## 2024-04-12 NOTE — ED Notes (Signed)
 Called report to [facility], [facility]ity fundraiser and called [facility] for transport.  Was told by [facility] it should be in the next 30 min.

## 2024-04-12 NOTE — Progress Notes (Addendum)
 9:25am: CSW spoke with Darrian at [facility] who states the facility can offer patient and accept her today.  Patient will go to room 908 at [facility] via [facility] - RN to call when ready. The number to call for report is [phone].  CSW attempted to reach patient's daughter without success - a voicemail was left requesting a return call.  CSW attempted to reach patient via phone without success.  CSW notified MD and RN of discharge plan.  8am: CSW sent referral to [facility] to obtain a bed offer.  Niels Portugal, MSW, LCSW Transitions of Care  Clinical Social Worker II [phone]

## 2024-04-12 NOTE — [place] FL2 (Signed)
 [place]  MEDICAID FL2 LEVEL OF CARE FORM     IDENTIFICATION  Patient Name: Tracy Park Birthdate: [birth year] Sex: female Admission Date (Current Location): 04/10/2024  [facility] and [place] Number:  Producer, television/film/video and Address:  [facility],  [address], [place] [address]      Provider Number: [id number]  Attending Physician Name and Address:  System, Provider Not In  Relative Name and Phone Number:  [name] (Daughter)  [phone] (Mobile)    Current Level of Care: Hospital Recommended Level of Care: Skilled Nursing Facility Prior Approval Number:    Date Approved/Denied:   PASRR Number: [id number]  Discharge Plan: SNF    Current Diagnoses: Patient Active Problem List   Diagnosis Date Noted   Closed compression fracture of L3 vertebra (HCC) 03/10/2024   Compression fracture of lumbar vertebra (HCC) 03/10/2024   Lumbar compression fracture (HCC) 03/10/2024   Hypokalemia 02/06/2024   Factitious disorder with predominantly psychological signs and symptoms 01/30/2024   Palliative care encounter 01/30/2024   Need for emotional support 01/30/2024   Counseling and coordination of care 01/30/2024   Goals of care, counseling/discussion 01/30/2024   COPD exacerbation (HCC) 01/30/2024   Acute on chronic respiratory failure with hypoxia and hypercapnia (HCC) 12/13/2023   COPD with acute exacerbation 09/22/2023   Paroxysmal atrial fibrillation (HCC) 02/23/2023   Pulmonary nodule 01/25/2023   COPD (chronic obstructive pulmonary disease) (HCC) 01/25/2023   Essential hypertension 01/25/2023   DDD (degenerative disc disease), cervical 03/24/2022   Bipolar disorder (HCC) 03/24/2022   Dyslipidemia 03/13/2022   Anxiety and depression 03/13/2022   History of lung cancer 03/13/2022   Chronic respiratory failure with hypoxia and hypercapnia (HCC) 02/26/2022   Normocytic anemia 02/08/2022   Malnutrition of moderate degree 08/25/2021   Coronary  artery disease involving native coronary artery of native heart without angina pectoris 08/18/2018   Chronic bilateral low back pain without sciatica 08/21/2016   Obstructive sleep apnea (adult) (pediatric) 02/22/2015   Hyperlipidemia 06/03/2013   Vitamin D  deficiency 06/03/2013   Hypothyroidism 04/28/2012   Tobacco use 04/28/2012    Orientation RESPIRATION BLADDER Height & Weight     Self, Time, Situation, Place  O2 (2L) Continent Weight:   Height:     BEHAVIORAL SYMPTOMS/MOOD NEUROLOGICAL BOWEL NUTRITION STATUS      Continent Diet (Regular Diet)  AMBULATORY STATUS COMMUNICATION OF NEEDS Skin   Limited Assist Verbally Normal                       Personal Care Assistance Level of Assistance  Bathing, Feeding, Dressing Bathing Assistance: Limited assistance Feeding assistance: Limited assistance Dressing Assistance: Limited assistance     Functional Limitations Info  Sight, Hearing, Speech Sight Info: Adequate Hearing Info: Adequate Speech Info: Adequate    SPECIAL CARE FACTORS FREQUENCY  PT (By licensed PT), OT (By licensed OT)     PT Frequency: 5x weekly OT Frequency: 5x weekly            Contractures Contractures Info: Not present    Additional Factors Info  Allergies, Code Status Code Status Info: Full Code Allergies Info: Red Dye #40 (Allura Red) Strawberry Extract Tomato Aspirin Tape Wound Dressing Adhesive           Current Medications (04/12/2024):  This is the current hospital active medication list Current Facility-Administered Medications  Medication Dose Route Frequency Provider Last Rate Last Admin   cyclobenzaprine  (FLEXERIL ) tablet 10 mg  10 mg Oral Daily  Garrick Charleston, MD   10 mg at 04/11/24 9074   gabapentin  (NEURONTIN ) capsule 300 mg  300 mg Oral TID Garrick Charleston, MD   300 mg at 04/11/24 2106   levothyroxine  (SYNTHROID ) tablet 125 mcg  125 mcg Oral QAC breakfast Garrick Charleston, MD   125 mcg at 04/11/24 9256   losartan  (COZAAR )  tablet 12.5 mg  12.5 mg Oral Daily Garrick Charleston, MD   12.5 mg at 04/11/24 9074   magnesium  oxide (MAG-OX) tablet 400 mg  400 mg Oral BID Garrick Charleston, MD   400 mg at 04/11/24 2106   metoprolol  succinate (TOPROL -XL) 24 hr tablet 12.5 mg  12.5 mg Oral Daily Garrick Charleston, MD   12.5 mg at 04/11/24 9073   morphine  (PF) 4 MG/ML injection 4 mg  4 mg Intravenous Q4H PRN Garrick Charleston, MD   4 mg at 04/12/24 0605   Current Outpatient Medications  Medication Sig Dispense Refill   albuterol  (VENTOLIN  HFA) 108 (90 Base) MCG/ACT inhaler Inhale 2 puffs into the lungs every 6 (six) hours as needed for wheezing or shortness of breath. 18 g 0   budesonide -glycopyrrolate -formoterol  (BREZTRI  AEROSPHERE) 160-9-4.8 MCG/ACT AERO inhaler Inhale 2 puffs into the lungs in the morning, at noon, and at bedtime. (Patient taking differently: Inhale 2 puffs into the lungs in the morning and at bedtime.) 10.7 g 5   ipratropium-albuterol  (DUONEB) 0.5-2.5 (3) MG/3ML SOLN Take 3 mLs by nebulization in the morning and at bedtime.     levothyroxine  (SYNTHROID ) 125 MCG tablet Take 125 mcg by mouth daily before breakfast.     Morphine  Sulfate (MORPHINE  CONCENTRATE) 10 mg / 0.5 ml concentrated solution Take 0.25 mLs (5 mg total) by mouth every 4 (four) hours as needed for severe pain (pain score 7-10). (Patient taking differently: Take 2.5 mg by mouth every 4 (four) hours as needed for severe pain (pain score 7-10) or moderate pain (pain score 4-6).) 15 mL 0   nitroGLYCERIN  (NITROSTAT ) 0.4 MG SL tablet Place 0.4 mg under the tongue every 5 (five) minutes as needed for chest pain.     OXYGEN  Inhale 2 L/min into the lungs continuous.     polyvinyl alcohol  (LIQUIFILM TEARS) 1.4 % ophthalmic solution Place 1 drop into the left eye in the morning, at noon, in the evening, and at bedtime.     prednisoLONE  acetate (PRED FORTE ) 1 % ophthalmic suspension Place 1 drop into the left eye in the morning and at bedtime.     rosuvastatin   (CRESTOR ) 20 MG tablet Take 20 mg by mouth daily.     sertraline  (ZOLOFT ) 25 MG tablet Take 25 mg by mouth in the morning.     apixaban  (ELIQUIS ) 5 MG TABS tablet Take 1 tablet (5 mg total) by mouth 2 (two) times daily. (Patient not taking: Reported on 03/03/2024) 60 tablet 1   cyclobenzaprine  (FLEXERIL ) 10 MG tablet Take 1 tablet (10 mg total) by mouth 2 (two) times daily as needed for muscle spasms. (Patient not taking: Reported on 04/11/2024) 20 tablet 0   gabapentin  (NEURONTIN ) 300 MG capsule Take 1 capsule (300 mg total) by mouth 3 (three) times daily. (Patient not taking: Reported on 04/11/2024)     guaiFENesin  (MUCINEX ) 600 MG 12 hr tablet Take 1 tablet (600 mg total) by mouth 2 (two) times daily. (Patient not taking: Reported on 04/11/2024) 60 tablet 0   lidocaine  (LIDODERM ) 5 % Place 1 patch onto the skin daily. Remove & Discard patch within 12 hours or as directed  by MD (Patient not taking: Reported on 04/11/2024) 30 patch 0   losartan  (COZAAR ) 25 MG tablet Take 12.5 mg by mouth daily. (Patient not taking: Reported on 04/11/2024)     metoprolol  succinate (TOPROL -XL) 25 MG 24 hr tablet Take 12.5 mg by mouth daily. (Patient not taking: Reported on 04/11/2024)     oxyCODONE  (ROXICODONE ) 5 MG immediate release tablet Take 1 tablet (5 mg total) by mouth every 4 (four) hours as needed for severe pain (pain score 7-10) or breakthrough pain. (Patient not taking: Reported on 04/11/2024) 15 tablet 0   predniSONE  (DELTASONE ) 10 MG tablet Take 4 pills daily for 2 days then 3 pills daily for 3 days then 2 pills daily for 3 days then continue taking 1 pill daily (Patient not taking: Reported on 03/10/2024) 50 tablet 0   VOLTAREN ARTHRITIS PAIN 1 % GEL Apply 1 Application topically as needed (pain). (Patient not taking: Reported on 03/10/2024)       Discharge Medications: Please see discharge summary for a list of discharge medications.  Relevant Imaging Results:  Relevant Lab Results:   Additional Information SSN:  [id number]  Niels LITTIE Portugal, LCSW

## 2024-05-12 ENCOUNTER — Encounter (HOSPITAL_COMMUNITY): Payer: Self-pay | Admitting: Family Medicine

## 2024-05-12 ENCOUNTER — Other Ambulatory Visit: Payer: Self-pay

## 2024-05-12 ENCOUNTER — Inpatient Hospital Stay (HOSPITAL_COMMUNITY)
Admission: EM | Admit: 2024-05-12 | Discharge: 2024-05-21 | DRG: 543 | Disposition: A | Discharge status: Skilled Nursing Facility | Attending: Internal Medicine | Admitting: Internal Medicine

## 2024-05-12 ENCOUNTER — Emergency Department (HOSPITAL_COMMUNITY)

## 2024-05-12 DIAGNOSIS — Z902 Acquired absence of lung [part of]: Secondary | ICD-10-CM

## 2024-05-12 DIAGNOSIS — Z720 Tobacco use: Secondary | ICD-10-CM | POA: Diagnosis present

## 2024-05-12 DIAGNOSIS — T40605A Adverse effect of unspecified narcotics, initial encounter: Secondary | ICD-10-CM | POA: Diagnosis present

## 2024-05-12 DIAGNOSIS — Z87442 Personal history of urinary calculi: Secondary | ICD-10-CM

## 2024-05-12 DIAGNOSIS — F419 Anxiety disorder, unspecified: Secondary | ICD-10-CM | POA: Diagnosis present

## 2024-05-12 DIAGNOSIS — F17210 Nicotine dependence, cigarettes, uncomplicated: Secondary | ICD-10-CM | POA: Diagnosis present

## 2024-05-12 DIAGNOSIS — Z85118 Personal history of other malignant neoplasm of bronchus and lung: Secondary | ICD-10-CM

## 2024-05-12 DIAGNOSIS — F4310 Post-traumatic stress disorder, unspecified: Secondary | ICD-10-CM | POA: Diagnosis present

## 2024-05-12 DIAGNOSIS — J9612 Chronic respiratory failure with hypercapnia: Secondary | ICD-10-CM | POA: Diagnosis present

## 2024-05-12 DIAGNOSIS — N200 Calculus of kidney: Secondary | ICD-10-CM | POA: Diagnosis present

## 2024-05-12 DIAGNOSIS — Z79899 Other long term (current) drug therapy: Secondary | ICD-10-CM

## 2024-05-12 DIAGNOSIS — Z66 Do not resuscitate: Secondary | ICD-10-CM | POA: Diagnosis present

## 2024-05-12 DIAGNOSIS — Z8249 Family history of ischemic heart disease and other diseases of the circulatory system: Secondary | ICD-10-CM

## 2024-05-12 DIAGNOSIS — S22060A Wedge compression fracture of T7-T8 vertebra, initial encounter for closed fracture: Secondary | ICD-10-CM | POA: Diagnosis not present

## 2024-05-12 DIAGNOSIS — Z7901 Long term (current) use of anticoagulants: Secondary | ICD-10-CM

## 2024-05-12 DIAGNOSIS — E785 Hyperlipidemia, unspecified: Secondary | ICD-10-CM | POA: Diagnosis present

## 2024-05-12 DIAGNOSIS — Z91048 Other nonmedicinal substance allergy status: Secondary | ICD-10-CM

## 2024-05-12 DIAGNOSIS — F40240 Claustrophobia: Secondary | ICD-10-CM | POA: Diagnosis present

## 2024-05-12 DIAGNOSIS — F319 Bipolar disorder, unspecified: Secondary | ICD-10-CM | POA: Diagnosis present

## 2024-05-12 DIAGNOSIS — I252 Old myocardial infarction: Secondary | ICD-10-CM

## 2024-05-12 DIAGNOSIS — G8921 Chronic pain due to trauma: Secondary | ICD-10-CM | POA: Diagnosis present

## 2024-05-12 DIAGNOSIS — S22069A Unspecified fracture of T7-T8 vertebra, initial encounter for closed fracture: Principal | ICD-10-CM

## 2024-05-12 DIAGNOSIS — Z833 Family history of diabetes mellitus: Secondary | ICD-10-CM

## 2024-05-12 DIAGNOSIS — I480 Paroxysmal atrial fibrillation: Secondary | ICD-10-CM | POA: Diagnosis present

## 2024-05-12 DIAGNOSIS — Z79890 Hormone replacement therapy (postmenopausal): Secondary | ICD-10-CM

## 2024-05-12 DIAGNOSIS — S32050A Wedge compression fracture of fifth lumbar vertebra, initial encounter for closed fracture: Secondary | ICD-10-CM | POA: Diagnosis present

## 2024-05-12 DIAGNOSIS — H409 Unspecified glaucoma: Secondary | ICD-10-CM | POA: Diagnosis present

## 2024-05-12 DIAGNOSIS — K5900 Constipation, unspecified: Secondary | ICD-10-CM

## 2024-05-12 DIAGNOSIS — Z9981 Dependence on supplemental oxygen: Secondary | ICD-10-CM

## 2024-05-12 DIAGNOSIS — Z91018 Allergy to other foods: Secondary | ICD-10-CM

## 2024-05-12 DIAGNOSIS — J449 Chronic obstructive pulmonary disease, unspecified: Secondary | ICD-10-CM | POA: Diagnosis present

## 2024-05-12 DIAGNOSIS — Z716 Tobacco abuse counseling: Secondary | ICD-10-CM

## 2024-05-12 DIAGNOSIS — J9611 Chronic respiratory failure with hypoxia: Secondary | ICD-10-CM | POA: Diagnosis present

## 2024-05-12 DIAGNOSIS — M8008XA Age-related osteoporosis with current pathological fracture, vertebra(e), initial encounter for fracture: Principal | ICD-10-CM | POA: Diagnosis present

## 2024-05-12 DIAGNOSIS — E440 Moderate protein-calorie malnutrition: Secondary | ICD-10-CM | POA: Diagnosis present

## 2024-05-12 DIAGNOSIS — Z886 Allergy status to analgesic agent status: Secondary | ICD-10-CM

## 2024-05-12 DIAGNOSIS — Z515 Encounter for palliative care: Secondary | ICD-10-CM

## 2024-05-12 DIAGNOSIS — F32A Depression, unspecified: Secondary | ICD-10-CM | POA: Diagnosis present

## 2024-05-12 DIAGNOSIS — M549 Dorsalgia, unspecified: Secondary | ICD-10-CM | POA: Diagnosis not present

## 2024-05-12 DIAGNOSIS — I10 Essential (primary) hypertension: Secondary | ICD-10-CM | POA: Diagnosis present

## 2024-05-12 DIAGNOSIS — R52 Pain, unspecified: Secondary | ICD-10-CM

## 2024-05-12 DIAGNOSIS — Z91041 Radiographic dye allergy status: Secondary | ICD-10-CM

## 2024-05-12 DIAGNOSIS — R54 Age-related physical debility: Secondary | ICD-10-CM | POA: Diagnosis present

## 2024-05-12 DIAGNOSIS — Z6820 Body mass index (BMI) 20.0-20.9, adult: Secondary | ICD-10-CM

## 2024-05-12 DIAGNOSIS — K5903 Drug induced constipation: Secondary | ICD-10-CM | POA: Diagnosis present

## 2024-05-12 LAB — BASIC METABOLIC PANEL WITH GFR
Anion gap: 13 (ref 5–15)
BUN: 13 mg/dL (ref 8–23)
CO2: 27 mmol/L (ref 22–32)
Calcium: 9.8 mg/dL (ref 8.9–10.3)
Chloride: 103 mmol/L (ref 98–111)
Creatinine, Ser: 0.49 mg/dL (ref 0.44–1.00)
GFR, Estimated: >60 mL/min (ref >60)
Glucose, Bld: 110 mg/dL — ABNORMAL HIGH (ref 70–99)
Potassium: 3.6 mmol/L (ref 3.5–5.1)
Sodium: 143 mmol/L (ref 135–145)

## 2024-05-12 LAB — CBC
HCT: 41.2 % (ref 36.0–46.0)
Hemoglobin: 12.4 g/dL (ref 12.0–15.0)
MCH: 28.3 pg (ref 26.0–34.0)
MCHC: 30.1 g/dL (ref 30.0–36.0)
MCV: 94.1 fL (ref 80.0–100.0)
Platelets: 245 K/uL (ref 150–400)
RBC: 4.38 MIL/uL (ref 3.87–5.11)
RDW: 14.6 % (ref 11.5–15.5)
WBC: 15.8 K/uL — ABNORMAL HIGH (ref 4.0–10.5)
nRBC: 0 % (ref 0.0–0.2)

## 2024-05-12 MED ORDER — HYDROCODONE-ACETAMINOPHEN 5-325 MG PO TABS
1.0000 | ORAL_TABLET | Freq: Four times a day (QID) | ORAL | Status: DC | PRN
  Administered 2024-05-13: 1 via ORAL
  Administered 2024-05-13: 2 via ORAL
  Administered 2024-05-13: 1 via ORAL
  Administered 2024-05-14: 2 via ORAL
  Filled 2024-05-12 (×2): qty 2
  Filled 2024-05-12: qty 1
  Filled 2024-05-12: qty 2

## 2024-05-12 MED ORDER — HYDROMORPHONE HCL 1 MG/ML IJ SOLN
1.0000 mg | Freq: Once | INTRAMUSCULAR | Status: AC
  Administered 2024-05-12: 1 mg via INTRAVENOUS
  Filled 2024-05-12: qty 1

## 2024-05-12 MED ORDER — PREDNISOLONE ACETATE 1 % OP SUSP
1.0000 [drp] | Freq: Two times a day (BID) | OPHTHALMIC | Status: DC
  Administered 2024-05-13 – 2024-05-21 (×14): 1 [drp] via OPHTHALMIC
  Filled 2024-05-12 (×2): qty 5

## 2024-05-12 MED ORDER — IOHEXOL 350 MG/ML SOLN
80.0000 mL | Freq: Once | INTRAVENOUS | Status: AC | PRN
  Administered 2024-05-12: 80 mL via INTRAVENOUS

## 2024-05-12 MED ORDER — BENZONATATE 100 MG PO CAPS
100.0000 mg | ORAL_CAPSULE | Freq: Three times a day (TID) | ORAL | Status: DC | PRN
  Administered 2024-05-14 – 2024-05-19 (×7): 100 mg via ORAL
  Filled 2024-05-12 (×7): qty 1

## 2024-05-12 MED ORDER — ALBUTEROL SULFATE (2.5 MG/3ML) 0.083% IN NEBU
3.0000 mL | INHALATION_SOLUTION | Freq: Four times a day (QID) | RESPIRATORY_TRACT | Status: DC | PRN
  Administered 2024-05-15: 3 mL via RESPIRATORY_TRACT
  Filled 2024-05-12: qty 3

## 2024-05-12 MED ORDER — ENOXAPARIN SODIUM 40 MG/0.4ML IJ SOSY
40.0000 mg | PREFILLED_SYRINGE | INTRAMUSCULAR | Status: DC
  Administered 2024-05-13 – 2024-05-16 (×4): 40 mg via SUBCUTANEOUS
  Filled 2024-05-12 (×4): qty 0.4

## 2024-05-12 MED ORDER — BUDESON-GLYCOPYRROL-FORMOTEROL 160-9-4.8 MCG/ACT IN AERO
2.0000 | INHALATION_SPRAY | Freq: Two times a day (BID) | RESPIRATORY_TRACT | Status: DC
  Administered 2024-05-13 – 2024-05-21 (×15): 2 via RESPIRATORY_TRACT
  Filled 2024-05-12 (×2): qty 5.9

## 2024-05-12 MED ORDER — METOPROLOL SUCCINATE ER 25 MG PO TB24
12.5000 mg | ORAL_TABLET | Freq: Every day | ORAL | Status: DC
  Administered 2024-05-13 – 2024-05-21 (×9): 12.5 mg via ORAL
  Filled 2024-05-12 (×9): qty 1

## 2024-05-12 MED ORDER — LACTATED RINGERS IV SOLN: INTRAVENOUS | Status: AC

## 2024-05-12 MED ORDER — ONDANSETRON HCL 4 MG PO TABS
4.0000 mg | ORAL_TABLET | Freq: Four times a day (QID) | ORAL | Status: DC | PRN
  Filled 2024-05-12: qty 1

## 2024-05-12 MED ORDER — SENNA 8.6 MG PO TABS
1.0000 | ORAL_TABLET | Freq: Two times a day (BID) | ORAL | Status: DC
  Administered 2024-05-13 – 2024-05-17 (×10): 8.6 mg via ORAL
  Filled 2024-05-12 (×11): qty 1

## 2024-05-12 MED ORDER — ONDANSETRON HCL 4 MG/2ML IJ SOLN
4.0000 mg | Freq: Four times a day (QID) | INTRAMUSCULAR | Status: DC | PRN
  Administered 2024-05-13 – 2024-05-14 (×3): 4 mg via INTRAVENOUS
  Filled 2024-05-12 (×3): qty 2

## 2024-05-12 MED ORDER — ACETAMINOPHEN 325 MG PO TABS: 650.0000 mg | ORAL_TABLET | Freq: Four times a day (QID) | ORAL | Status: DC | PRN

## 2024-05-12 MED ORDER — NICOTINE 14 MG/24HR TD PT24
14.0000 mg | MEDICATED_PATCH | Freq: Every day | TRANSDERMAL | Status: DC
  Administered 2024-05-14: 14 mg via TRANSDERMAL
  Filled 2024-05-12 (×5): qty 1

## 2024-05-12 MED ORDER — SERTRALINE HCL 25 MG PO TABS
25.0000 mg | ORAL_TABLET | Freq: Every day | ORAL | Status: DC
  Administered 2024-05-13 – 2024-05-21 (×9): 25 mg via ORAL
  Filled 2024-05-12 (×9): qty 1

## 2024-05-12 MED ORDER — LEVOTHYROXINE SODIUM 125 MCG PO TABS
125.0000 ug | ORAL_TABLET | Freq: Every day | ORAL | Status: DC
  Administered 2024-05-13 – 2024-05-21 (×7): 125 ug via ORAL
  Filled 2024-05-12 (×7): qty 1

## 2024-05-12 MED ORDER — HYDROMORPHONE HCL 1 MG/ML IJ SOLN
0.5000 mg | INTRAMUSCULAR | Status: DC | PRN
  Administered 2024-05-13 (×4): 0.5 mg via INTRAVENOUS
  Filled 2024-05-12 (×4): qty 0.5

## 2024-05-12 MED ORDER — ROSUVASTATIN CALCIUM 10 MG PO TABS
20.0000 mg | ORAL_TABLET | Freq: Every day | ORAL | Status: DC
Start: 2024-05-13 — End: 2024-05-21
  Administered 2024-05-13 – 2024-05-21 (×9): 20 mg via ORAL
  Filled 2024-05-12 (×9): qty 2

## 2024-05-12 MED ORDER — POLYETHYLENE GLYCOL 3350 17 G PO PACK: 17.0000 g | PACK | Freq: Every day | ORAL | Status: DC | PRN

## 2024-05-12 MED ORDER — IPRATROPIUM-ALBUTEROL 0.5-2.5 (3) MG/3ML IN SOLN
3.0000 mL | Freq: Two times a day (BID) | RESPIRATORY_TRACT | Status: DC
  Administered 2024-05-13 – 2024-05-21 (×14): 3 mL via RESPIRATORY_TRACT
  Filled 2024-05-12 (×14): qty 3

## 2024-05-12 MED ORDER — ACETAMINOPHEN 650 MG RE SUPP: 650.0000 mg | Freq: Four times a day (QID) | RECTAL | Status: DC | PRN

## 2024-05-12 MED ORDER — DOXYCYCLINE HYCLATE 100 MG PO TABS
100.0000 mg | ORAL_TABLET | Freq: Two times a day (BID) | ORAL | Status: AC
  Administered 2024-05-13 – 2024-05-19 (×14): 100 mg via ORAL
  Filled 2024-05-12 (×14): qty 1

## 2024-05-12 NOTE — ED Notes (Signed)
 ED TO INPATIENT HANDOFF REPORT  Name/Age/Gender Tracy Park 68 y.o. female  Code Status    Code Status Orders  (From admission, onward)           Start     Ordered   05/12/24 2223  Do not attempt resuscitation (DNR)- Limited -Do Not Intubate (DNI)  Continuous       Question Answer Comment  If pulseless and not breathing No CPR or chest compressions.   In Pre-Arrest Conditions (Patient Is Breathing and Has A Pulse) Do not intubate. Provide all appropriate non-invasive medical interventions. Avoid ICU transfer unless indicated or required.   Consent: Discussion documented in EHR or advanced directives reviewed      05/12/24 2230           Code Status History     Date Active Date Inactive Code Status Order ID Comments User Context   03/16/2024 2324 03/23/2024 1723 Limited: Do not attempt resuscitation (DNR) -DNR-LIMITED -Do Not Intubate/DNI  [id number]  [name] POUR, NP Inpatient   03/10/2024 0921 03/16/2024 2324 Full Code [id number]  [name], MD ED   03/10/2024 0734 03/10/2024 0921 Full Code [id number]  [name], MD ED   03/10/2024 0153 03/10/2024 0734 Limited: Do not attempt resuscitation (DNR) -DNR-LIMITED -Do Not Intubate/DNI  [id number]  [name], DO ED   03/02/2024 2338 03/09/2024 0012 Limited: Do not attempt resuscitation (DNR) -DNR-LIMITED -Do Not Intubate/DNI  [id number]  [name], MD ED   02/06/2024 0102 02/09/2024 2157 Limited: Do not attempt resuscitation (DNR) -DNR-LIMITED -Do Not Intubate/DNI  [id number]  [name], MD Inpatient   01/29/2024 2130 01/31/2024 1746 Limited: Do not attempt resuscitation (DNR) -DNR-LIMITED -Do Not Intubate/DNI  [id number]  [name], MD ED   01/13/2024 2251 01/23/2024 1550 Limited: Do not attempt resuscitation (DNR) -DNR-LIMITED -Do Not Intubate/DNI  [id number]  [name], MD ED   01/02/2024 1950 01/07/2024 1833 Limited: Do not attempt resuscitation (DNR) -DNR-LIMITED -Do Not Intubate/DNI   [id number]  [name], MD Inpatient   12/28/2023 2137 12/30/2023 1838 Limited: Do not attempt resuscitation (DNR) -DNR-LIMITED -Do Not Intubate/DNI  [id number]  [name], MD ED   12/13/2023 0434 12/19/2023 1705 Limited: Do not attempt resuscitation (DNR) -DNR-LIMITED -Do Not Intubate/DNI  [id number]  [name], MD ED   11/01/2023 1624 11/07/2023 1849 Limited: Do not attempt resuscitation (DNR) -DNR-LIMITED -Do Not Intubate/DNI  [id number]  [name], MD ED   09/22/2023 2146 09/28/2023 2207 Limited: Do not attempt resuscitation (DNR) -DNR-LIMITED -Do Not Intubate/DNI  [id number]  [name], MD ED   08/04/2023 0227 08/09/2023 1843 Limited: Do not attempt resuscitation (DNR) -DNR-LIMITED -Do Not Intubate/DNI  [id number]  [name], MD Inpatient   08/03/2023 2204 08/04/2023 0227 Full Code [id number]  [name], MD ED   07/24/2023 2244 07/27/2023 2024 Limited: Do not attempt resuscitation (DNR) -DNR-LIMITED -Do Not Intubate/DNI  [id number]  [name], MD ED   06/25/2023 0003 06/27/2023 2009 Limited: Do not attempt resuscitation (DNR) -DNR-LIMITED -Do Not Intubate/DNI  [id number]  [name], MD ED   06/19/2023 1739 06/22/2023 2128 Do not attempt resuscitation (DNR) PRE-ARREST INTERVENTIONS DESIRED [id number]  [name], MD ED   06/04/2023 1509 06/06/2023 1814 Do not attempt resuscitation (DNR) PRE-ARREST INTERVENTIONS DESIRED [id number]  [name], MD ED   04/28/2023 0741 05/01/2023 1830 DNR [id number]  [name], MD ED   04/17/2023 0151 04/18/2023 1944 DNR [id number]  [name], DO  Inpatient   04/11/2023 2132 04/14/2023 0035 Full Code [id number]  [name], DO ED   03/26/2023 0938 03/27/2023 1838 DNR [id number]  [name], MD Inpatient   03/06/2023 2249 03/10/2023 2107 DNR [id number]  [name], DO ED   02/23/2023 2046 02/26/2023 2245 DNR [id number]  [name], MD ED   01/25/2023 2350 01/28/2023 1928 DNR [id number]  [name], DO Inpatient    01/08/2023 0235 01/13/2023 1928 DNR [id number]  [name], DO Inpatient   01/08/2023 0117 01/08/2023 0235 Full Code [id number]  [name], DO ED   03/24/2022 1159 03/28/2022 1546 Full Code [id number]  [name], MD ED   03/14/2022 2132 03/16/2022 1602 Full Code [id number]  [name], MD Inpatient   03/14/2022 0331 03/14/2022 2132 DNR [id number]  [name], MD ED   03/13/2022 2149 03/14/2022 0331 DNR [id number]  Lawence Madison LABOR, MD ED   02/26/2022 0200 03/01/2022 1555 DNR [id number]  [name], DO ED   08/16/2021 2316 08/25/2021 2324 DNR [id number]  [name], MD ED   08/16/2021 2253 08/16/2021 2316 Full Code [id number]  [name], MD ED   05/03/2021 0052 05/05/2021 1756 Full Code [id number]  [name], MD ED   04/10/2020 0342 04/12/2020 2137 Full Code [id number]  [name], MD ED   04/28/2012 [id number] 04/28/2012 2047 Full Code [id number]  [name], RN Inpatient       Home/SNF/Other Home  Chief Complaint Compression fracture of T8 vertebra (HCC) [S22.060A]  Level of Care/Admitting Diagnosis ED Disposition     ED Disposition  Admit   Condition  --   Comment  Hospital Area: [facility] [[id number]]  Level of Care: Telemetry [5]  Admit to tele based on following criteria: Complex arrhythmia (Bradycardia/Tachycardia)  May place patient in observation at [facility] or [facility] if equivalent level of care is available:: Yes  Covid Evaluation: Asymptomatic - no recent exposure (last 10 days) testing not required  Diagnosis: Compression fracture of T8 vertebra [facility]) [[id number]]  Admitting Physician: ARSHAD, MOHSIN A [[id number]]  Attending Physician: ARSHAD, MOHSIN A [phone]  For patients discharging to extended facilities (i.e. SNF, AL, group homes or LTAC) initiate:: Discharge to SNF/Facility Placement COVID-19 Lab Testing Protocol          Medical History Past Medical History:  Diagnosis Date   Adenocarcinoma of lung  (HCC) 02/26/2022   Anginal pain (HCC)    Anxiety    Bipolar disorder (HCC)    Cancer (HCC)    COPD (chronic obstructive pulmonary disease) (HCC)    Dyspnea    Family history of adverse reaction to anesthesia    History of depression 08/03/2023   History of kidney stones    Hydroureteronephrosis 08/16/2021   Hypothyroidism    Lung cancer (HCC)    Malignant neoplasm of upper lobe of left lung (HCC) 11/12/2016   Myocardial infarction [facility])    Paroxysmal atrial fibrillation (HCC)    Paroxysmal atrial fibrillation with RVR (HCC) 01/25/2023   PTSD (post-traumatic stress disorder)    Rib pain on left side 06/24/2023   Sleep apnea    Thyroid  disease     Allergies Allergies  Allergen Reactions   Red Dye #40 (Allura Red) Hives, Itching and Other (See Comments)    Red food dye   Strawberry Extract Hives and Itching   Tomato Hives and Itching   Aspirin Hives   Tape Rash and Other (See Comments)  Prefers paper tape   Wound Dressing Adhesive Rash    IV Location/Drains/Wounds Patient Lines/Drains/Airways Status     Active Line/Drains/Airways     Name Placement date Placement time Site Days   Peripheral IV 05/12/24 20 G Anterior;Left Wrist 05/12/24  1712  Wrist  less than 1   Peripheral IV 05/12/24 20 G 1 Right Antecubital 05/12/24  1857  Antecubital  less than 1   Wound 03/17/24 1700 Surgical Vertebral column Lower 03/17/24  1700  Vertebral column  56            Labs/Imaging Results for orders placed or performed during the hospital encounter of 05/12/24 (from the past 48 hours)  Basic metabolic panel     Status: Abnormal   Collection Time: 05/12/24  5:12 PM  Result Value Ref Range   Sodium 143 135 - 145 mmol/L   Potassium 3.6 3.5 - 5.1 mmol/L   Chloride 103 98 - 111 mmol/L   CO2 27 22 - 32 mmol/L   Glucose, Bld 110 (H) 70 - 99 mg/dL    Comment: Glucose reference range applies only to samples taken after fasting for at least 8 hours.   BUN 13 8 - 23 mg/dL    Creatinine, Ser 9.50 0.44 - 1.00 mg/dL   Calcium  9.8 8.9 - 10.3 mg/dL   GFR, Estimated >39 >39 mL/min    Comment: (NOTE) Calculated using the CKD-EPI Creatinine Equation (2021)    Anion gap 13 5 - 15    Comment: Performed at [facility], [address], [place], [place] [address]  CBC     Status: Abnormal   Collection Time: 05/12/24  5:12 PM  Result Value Ref Range   WBC 15.8 (H) 4.0 - 10.5 K/uL   RBC 4.38 3.87 - 5.11 MIL/uL   Hemoglobin 12.4 12.0 - 15.0 g/dL   HCT 58.7 63.9 - 53.9 %   MCV 94.1 80.0 - 100.0 fL   MCH 28.3 26.0 - 34.0 pg   MCHC 30.1 30.0 - 36.0 g/dL   RDW 85.3 88.4 - 84.4 %   Platelets 245 150 - 400 K/uL   nRBC 0.0 0.0 - 0.2 %    Comment: Performed at [facility], [address], [place], [place] [address]   CT L-SPINE NO CHARGE Result Date: 05/12/2024 CLINICAL DATA:  Back pain EXAM: CT Thoracic and Lumbar spine with contrast TECHNIQUE: Multiplanar CT images of the thoracic and lumbar spine were reconstructed from contemporary CT of the Chest, Abdomen, and Pelvis. RADIATION DOSE REDUCTION: This exam was performed according to the departmental dose-optimization program which includes automated exposure control, adjustment of the mA and/or kV according to patient size and/or use of iterative reconstruction technique. CONTRAST:  No additional contrast was administered for these reconstructions. COMPARISON:  CT lumbar spine 04/10/2024, CT chest 01/10/2024 FINDINGS: CT THORACIC SPINE FINDINGS Alignment: Normal. Vertebrae: There is an acute appearing superior endplate fracture of T8 with approximately 20-30% loss of height and no retropulsion. The posterior elements appear intact. There are superior endplate fractures of T11 and T12 with approximately 10-20% loss of height which appear chronic in nature. Remaining vertebral body height is preserved. Osseous structures are mildly osteopenic. Paraspinal and other soft tissues: There is mild  paravertebral soft tissue swelling in keeping with edema or interstitial hemorrhage anterior to T8 the paraspinal soft tissues are otherwise unremarkable. See accompanying report for CT examination of the chest, abdomen, and pelvis. Disc levels: There is mild endplate remodeling and  vacuum disc phenomena at T10-11 and T12-L1 in keeping with changes of mild to moderate degenerative disc disease. No high-grade canal stenosis. No significant neuroforaminal narrowing. CT LUMBAR SPINE FINDINGS Segmentation: Transitional lumbar anatomy with a partially lumbarized S1. Consistent numbering system utilized based on the presence of a fully formed disc at L5-S1. Alignment: Grade 2 anterolisthesis L5-S1 approximately 8 mm, stable. Otherwise normal lumbar lordosis Vertebrae: Superior endplate fracture of L1 again seen with progressive sclerosis and loss of height of the L1 vertebral body now 10-20%. No retropulsion. Posterior elements are intact. Stable superior endplate fracture of L2. Stable superior endplate fractures of L3 and L4 status post vertebroplasty. New superior endplate fracture of L5 with approximately 20-30% loss of height and no retropulsion. Posterior elements appear intact at this level. Paraspinal and other soft tissues: Lack of significant soft tissue swelling surrounding the L5 vertebral body suggests this fracture may be subacute in nature. No paraspinal fluid collection is seen. Refer to dictated report for CT examination of the chest, abdomen, pelvis for visceral findings. Disc levels: Vacuum disc phenomena L1-2 and disc space narrowing at L5-S1 is present in keeping with changes of moderate degenerative disc disease. Remaining disc heights are preserved. No high-grade canal stenosis. Disc height loss, un covered disc material, and anterolisthesis at L5-S1 result in severe left and moderate to severe right neuroforaminal narrowing with probable impingement of the exiting L5 nerve roots bilaterally.  IMPRESSION: 1. Acute appearing superior endplate fracture of T8 with approximately 20-30% loss of height and no retropulsion. 2. New superior endplate fracture of L5 since prior examination of 04/10/2024 with approximately 20-30% loss of height and no retropulsion. Lack of significant soft tissue swelling surrounding the L5 vertebral body suggests this fracture may be subacute in nature. 3. Progressive loss of height of the L1 vertebral body now 10-20%. No retropulsion. 4. Stable superior endplate fractures of T11, T12, L2, L3, and L4 status post vertebroplasty at L3 and L4. 5. Stable grade 2 anterolisthesis L5-S1 with resultant severe left and moderate to severe right neuroforaminal narrowing with probable impingement of the exiting L5 nerve roots bilaterally. Electronically Signed   By: Dorethia Molt M.D.   On: 05/12/2024 20:54   CT T-SPINE NO CHARGE Result Date: 05/12/2024 CLINICAL DATA:  Back pain EXAM: CT Thoracic and Lumbar spine with contrast TECHNIQUE: Multiplanar CT images of the thoracic and lumbar spine were reconstructed from contemporary CT of the Chest, Abdomen, and Pelvis. RADIATION DOSE REDUCTION: This exam was performed according to the departmental dose-optimization program which includes automated exposure control, adjustment of the mA and/or kV according to patient size and/or use of iterative reconstruction technique. CONTRAST:  No additional contrast was administered for these reconstructions. COMPARISON:  CT lumbar spine 04/10/2024, CT chest 01/10/2024 FINDINGS: CT THORACIC SPINE FINDINGS Alignment: Normal. Vertebrae: There is an acute appearing superior endplate fracture of T8 with approximately 20-30% loss of height and no retropulsion. The posterior elements appear intact. There are superior endplate fractures of T11 and T12 with approximately 10-20% loss of height which appear chronic in nature. Remaining vertebral body height is preserved. Osseous structures are mildly osteopenic.  Paraspinal and other soft tissues: There is mild paravertebral soft tissue swelling in keeping with edema or interstitial hemorrhage anterior to T8 the paraspinal soft tissues are otherwise unremarkable. See accompanying report for CT examination of the chest, abdomen, and pelvis. Disc levels: There is mild endplate remodeling and vacuum disc phenomena at T10-11 and T12-L1 in keeping with changes of mild to moderate  degenerative disc disease. No high-grade canal stenosis. No significant neuroforaminal narrowing. CT LUMBAR SPINE FINDINGS Segmentation: Transitional lumbar anatomy with a partially lumbarized S1. Consistent numbering system utilized based on the presence of a fully formed disc at L5-S1. Alignment: Grade 2 anterolisthesis L5-S1 approximately 8 mm, stable. Otherwise normal lumbar lordosis Vertebrae: Superior endplate fracture of L1 again seen with progressive sclerosis and loss of height of the L1 vertebral body now 10-20%. No retropulsion. Posterior elements are intact. Stable superior endplate fracture of L2. Stable superior endplate fractures of L3 and L4 status post vertebroplasty. New superior endplate fracture of L5 with approximately 20-30% loss of height and no retropulsion. Posterior elements appear intact at this level. Paraspinal and other soft tissues: Lack of significant soft tissue swelling surrounding the L5 vertebral body suggests this fracture may be subacute in nature. No paraspinal fluid collection is seen. Refer to dictated report for CT examination of the chest, abdomen, pelvis for visceral findings. Disc levels: Vacuum disc phenomena L1-2 and disc space narrowing at L5-S1 is present in keeping with changes of moderate degenerative disc disease. Remaining disc heights are preserved. No high-grade canal stenosis. Disc height loss, un covered disc material, and anterolisthesis at L5-S1 result in severe left and moderate to severe right neuroforaminal narrowing with probable impingement  of the exiting L5 nerve roots bilaterally. IMPRESSION: 1. Acute appearing superior endplate fracture of T8 with approximately 20-30% loss of height and no retropulsion. 2. New superior endplate fracture of L5 since prior examination of 04/10/2024 with approximately 20-30% loss of height and no retropulsion. Lack of significant soft tissue swelling surrounding the L5 vertebral body suggests this fracture may be subacute in nature. 3. Progressive loss of height of the L1 vertebral body now 10-20%. No retropulsion. 4. Stable superior endplate fractures of T11, T12, L2, L3, and L4 status post vertebroplasty at L3 and L4. 5. Stable grade 2 anterolisthesis L5-S1 with resultant severe left and moderate to severe right neuroforaminal narrowing with probable impingement of the exiting L5 nerve roots bilaterally. Electronically Signed   By: Dorethia Molt M.D.   On: 05/12/2024 20:54   CT Angio Chest PE W and/or Wo Contrast Result Date: 05/12/2024 CLINICAL DATA:  High probability for PE. Abdominal and right flank pain. EXAM: CT ANGIOGRAPHY CHEST CT ABDOMEN AND PELVIS WITH CONTRAST TECHNIQUE: Multidetector CT imaging of the chest was performed using the standard protocol during bolus administration of intravenous contrast. Multiplanar CT image reconstructions and MIPs were obtained to evaluate the vascular anatomy. Multidetector CT imaging of the abdomen and pelvis was performed using the standard protocol during bolus administration of intravenous contrast. RADIATION DOSE REDUCTION: This exam was performed according to the departmental dose-optimization program which includes automated exposure control, adjustment of the mA and/or kV according to patient size and/or use of iterative reconstruction technique. CONTRAST:  80mL OMNIPAQUE  IOHEXOL  350 MG/ML SOLN COMPARISON:  CT renal stone 04/10/2024. CT angiogram chest 01/10/2024. FINDINGS: CTA CHEST FINDINGS Cardiovascular: Satisfactory opacification of the pulmonary arteries to  the segmental level. No evidence of pulmonary embolism. Normal heart size. No pericardial effusion. Mediastinum/Nodes: No enlarged mediastinal, hilar, or axillary lymph nodes. Thyroid  gland, trachea, and esophagus demonstrate no significant findings. Lungs/Pleura: Left upper lobectomy changes are again seen. There stable pleural-parenchymal thickening in the upper left hemithorax. Moderate emphysema is again seen. There is bilateral peribronchial wall thickening diffusely. Focal airspace opacities in the posterior right upper lobe have minimally increased in size. There are scattered tree-in-bud opacities throughout the left lower lobe similar to  prior. There is no pneumothorax. Trachea and central airways are patent. Pituitary anus and anal there is a medial left upper lobe nodule measuring 5 mm image 12/65 which is unchanged. Musculoskeletal: Mild compression deformity of T8 appears chronic and unchanged. T11 and T12 mild compression deformities appear new from prior. Review of the MIP images confirms the above findings. CT ABDOMEN and PELVIS FINDINGS Hepatobiliary: The liver, gallbladder and bile ducts are within normal limits. Pancreas: Unremarkable. No pancreatic ductal dilatation or surrounding inflammatory changes. Spleen: Normal in size without focal abnormality. Adrenals/Urinary Tract: There is a punctate calculus in the lower pole the right kidney. Otherwise, kidneys, adrenal glands and bladder are within normal limits. Stomach/Bowel: Stomach is within normal limits. Appendix is not seen. No evidence of bowel wall thickening, distention, or inflammatory changes. Vascular/Lymphatic: Aortic atherosclerosis. No enlarged abdominal or pelvic lymph nodes. Reproductive: Status post hysterectomy. No adnexal masses. Other: No abdominal wall hernia or abnormality. No abdominopelvic ascites. Musculoskeletal: L2 compression deformity is unchanged. Vertebroplasty changes at L3 and L4 unchanged. There is new mild  compression deformity of L5 when compared to 04/10/2024. There stable grade 1 anterolisthesis at L5-S1. Review of the MIP images confirms the above findings. IMPRESSION: 1. No evidence for pulmonary embolism. 2. Stable left upper lobectomy changes. 3. Stable 5 mm left upper lobe pulmonary nodule. 4. Stable tree-in-bud opacities in the left lower lobe, likely infectious/inflammatory. 5. New mild compression deformities of T11, T12 and L5. 6. Nonobstructing right renal calculus. Aortic Atherosclerosis (ICD10-I70.0) and Emphysema (ICD10-J43.9). Electronically Signed   By: Greig Pique M.D.   On: 05/12/2024 20:45   CT ABDOMEN PELVIS W CONTRAST Result Date: 05/12/2024 CLINICAL DATA:  High probability for PE. Abdominal and right flank pain. EXAM: CT ANGIOGRAPHY CHEST CT ABDOMEN AND PELVIS WITH CONTRAST TECHNIQUE: Multidetector CT imaging of the chest was performed using the standard protocol during bolus administration of intravenous contrast. Multiplanar CT image reconstructions and MIPs were obtained to evaluate the vascular anatomy. Multidetector CT imaging of the abdomen and pelvis was performed using the standard protocol during bolus administration of intravenous contrast. RADIATION DOSE REDUCTION: This exam was performed according to the departmental dose-optimization program which includes automated exposure control, adjustment of the mA and/or kV according to patient size and/or use of iterative reconstruction technique. CONTRAST:  80mL OMNIPAQUE  IOHEXOL  350 MG/ML SOLN COMPARISON:  CT renal stone 04/10/2024. CT angiogram chest 01/10/2024. FINDINGS: CTA CHEST FINDINGS Cardiovascular: Satisfactory opacification of the pulmonary arteries to the segmental level. No evidence of pulmonary embolism. Normal heart size. No pericardial effusion. Mediastinum/Nodes: No enlarged mediastinal, hilar, or axillary lymph nodes. Thyroid  gland, trachea, and esophagus demonstrate no significant findings. Lungs/Pleura: Left upper  lobectomy changes are again seen. There stable pleural-parenchymal thickening in the upper left hemithorax. Moderate emphysema is again seen. There is bilateral peribronchial wall thickening diffusely. Focal airspace opacities in the posterior right upper lobe have minimally increased in size. There are scattered tree-in-bud opacities throughout the left lower lobe similar to prior. There is no pneumothorax. Trachea and central airways are patent. Pituitary anus and anal there is a medial left upper lobe nodule measuring 5 mm image 12/65 which is unchanged. Musculoskeletal: Mild compression deformity of T8 appears chronic and unchanged. T11 and T12 mild compression deformities appear new from prior. Review of the MIP images confirms the above findings. CT ABDOMEN and PELVIS FINDINGS Hepatobiliary: The liver, gallbladder and bile ducts are within normal limits. Pancreas: Unremarkable. No pancreatic ductal dilatation or surrounding inflammatory changes. Spleen: Normal in size  without focal abnormality. Adrenals/Urinary Tract: There is a punctate calculus in the lower pole the right kidney. Otherwise, kidneys, adrenal glands and bladder are within normal limits. Stomach/Bowel: Stomach is within normal limits. Appendix is not seen. No evidence of bowel wall thickening, distention, or inflammatory changes. Vascular/Lymphatic: Aortic atherosclerosis. No enlarged abdominal or pelvic lymph nodes. Reproductive: Status post hysterectomy. No adnexal masses. Other: No abdominal wall hernia or abnormality. No abdominopelvic ascites. Musculoskeletal: L2 compression deformity is unchanged. Vertebroplasty changes at L3 and L4 unchanged. There is new mild compression deformity of L5 when compared to 04/10/2024. There stable grade 1 anterolisthesis at L5-S1. Review of the MIP images confirms the above findings. IMPRESSION: 1. No evidence for pulmonary embolism. 2. Stable left upper lobectomy changes. 3. Stable 5 mm left upper lobe  pulmonary nodule. 4. Stable tree-in-bud opacities in the left lower lobe, likely infectious/inflammatory. 5. New mild compression deformities of T11, T12 and L5. 6. Nonobstructing right renal calculus. Aortic Atherosclerosis (ICD10-I70.0) and Emphysema (ICD10-J43.9). Electronically Signed   By: Greig Pique M.D.   On: 05/12/2024 20:45    Pending Labs Unresulted Labs (From admission, onward)     Start     Ordered   05/19/24 0500  Creatinine, serum  (enoxaparin  (LOVENOX )    CrCl >/= 30 ml/min)  Weekly,   R     Comments: while on enoxaparin  therapy    05/12/24 2230   05/13/24 0500  Protime-INR  Tomorrow morning,   R        05/12/24 2230   05/13/24 0500  Basic metabolic panel  Tomorrow morning,   R        05/12/24 2230   05/13/24 0500  CBC  Tomorrow morning,   R        05/12/24 2230   05/12/24 2222  CBC  (enoxaparin  (LOVENOX )    CrCl >/= 30 ml/min)  Once,   R       Comments: Baseline for enoxaparin  therapy IF NOT ALREADY DRAWN.  Notify MD if PLT < 100 K.    05/12/24 2230   05/12/24 2222  Creatinine, serum  (enoxaparin  (LOVENOX )    CrCl >/= 30 ml/min)  Once,   R       Comments: Baseline for enoxaparin  therapy IF NOT ALREADY DRAWN.    05/12/24 2230            Vitals/Pain Today's Vitals   05/12/24 1832 05/12/24 2008 05/12/24 2204 05/12/24 2215  BP: 127/67 (!) 127/91  124/81  Pulse: (!) 106 (!) 103  94  Resp: (!) 25 20  16   Temp: 98 F (36.7 C)   97.9 F (36.6 C)  TempSrc: Oral   Oral  SpO2: 95% 95%  94%  PainSc:   8      Isolation Precautions No active isolations  Medications Medications  rosuvastatin  (CRESTOR ) tablet 20 mg (has no administration in time range)  sertraline  (ZOLOFT ) tablet 25 mg (has no administration in time range)  levothyroxine  (SYNTHROID ) tablet 125 mcg (has no administration in time range)  albuterol  (PROVENTIL ) (2.5 MG/3ML) 0.083% nebulizer solution 3 mL (has no administration in time range)  budesonide -glycopyrrolate -formoterol  (BREZTRI )  160-9-4.8 MCG/ACT inhaler 2 puff (has no administration in time range)  ipratropium-albuterol  (DUONEB) 0.5-2.5 (3) MG/3ML nebulizer solution 3 mL (has no administration in time range)  prednisoLONE  acetate (PRED FORTE ) 1 % ophthalmic suspension 1 drop (has no administration in time range)  enoxaparin  (LOVENOX ) injection 40 mg (has no administration in time range)  lactated ringers  infusion (  has no administration in time range)  acetaminophen  (TYLENOL ) tablet 650 mg (has no administration in time range)    Or  acetaminophen  (TYLENOL ) suppository 650 mg (has no administration in time range)  HYDROmorphone  (DILAUDID ) injection 0.5 mg (has no administration in time range)  HYDROcodone -acetaminophen  (NORCO/VICODIN) 5-325 MG per tablet 1-2 tablet (has no administration in time range)  senna (SENOKOT) tablet 8.6 mg (has no administration in time range)  polyethylene glycol (MIRALAX  / GLYCOLAX ) packet 17 g (has no administration in time range)  ondansetron  (ZOFRAN ) tablet 4 mg (has no administration in time range)    Or  ondansetron  (ZOFRAN ) injection 4 mg (has no administration in time range)  nicotine  (NICODERM CQ  - dosed in mg/24 hours) patch 14 mg (has no administration in time range)  doxycycline  (VIBRA -TABS) tablet 100 mg (has no administration in time range)  HYDROmorphone  (DILAUDID ) injection 1 mg (1 mg Intravenous Given 05/12/24 1834)  iohexol  (OMNIPAQUE ) 350 MG/ML injection 80 mL (80 mLs Intravenous Contrast Given 05/12/24 1944)  HYDROmorphone  (DILAUDID ) injection 1 mg (1 mg Intravenous Given 05/12/24 2204)    Mobility walks with person assist

## 2024-05-12 NOTE — ED Notes (Signed)
 Patient resting in bed.

## 2024-05-12 NOTE — ED Provider Notes (Signed)
 [place] EMERGENCY DEPARTMENT AT [facility] Provider Note   CSN: [id number] Arrival date & time: 05/12/24  1650     Patient presents with: Back Pain   Tracy Park is a 68 y.o. female.  {Add pertinent medical, surgical, social history, OB history to HPI:32947} HPI      68 year old female with a history of COPD on 2 L of oxygen  chronically,, adenocarcinoma of the left lung (was on home hospice), hypothyroidism, anxiety, PTSD, atrial fibrillation on eliquis , hyperlipidemia, fracture of L3 for which she had admission, status post kyphoplasty by IR 7/9   Reaching for something out of the refrigerator, then felt the pain on the right side of her back Pain severe 8/10, worse with movements Feeling a little short of breath but at baseline with COPD When she moves her right leg low back hurts No numbness or weakness No loss of control of bowel or bladder No fever, no urinary symptoms No cough worse than baseline (but hurts to cough) Both lower and middle part of back Does not think she is on blood thinner  Was at rehab until yesterday at 3PM, at 4PM hospice came to the house, called them today and told to go to ED  Before the pain was on the left, now it is right and just started today    Past Medical History:  Diagnosis Date   Adenocarcinoma of lung (HCC) 02/26/2022   Anginal pain (HCC)    Anxiety    Bipolar disorder (HCC)    Cancer (HCC)    COPD (chronic obstructive pulmonary disease) (HCC)    Dyspnea    Family history of adverse reaction to anesthesia    History of depression 08/03/2023   History of kidney stones    Hydroureteronephrosis 08/16/2021   Hypothyroidism    Lung cancer (HCC)    Malignant neoplasm of upper lobe of left lung (HCC) 11/12/2016   Myocardial infarction [facility])    Paroxysmal atrial fibrillation (HCC)    Paroxysmal atrial fibrillation with RVR (HCC) 01/25/2023   PTSD (post-traumatic stress disorder)    Rib pain on left side  06/24/2023   Sleep apnea    Thyroid  disease      Prior to Admission medications   Medication Sig Start Date End Date Taking? Authorizing Provider  albuterol  (VENTOLIN  HFA) 108 (90 Base) MCG/ACT inhaler Inhale 2 puffs into the lungs every 6 (six) hours as needed for wheezing or shortness of breath. 03/08/24   Jillian Buttery, MD  apixaban  (ELIQUIS ) 5 MG TABS tablet Take 1 tablet (5 mg total) by mouth 2 (two) times daily. Patient not taking: Reported on 03/03/2024 06/06/23   Barbarann Nest, MD  budesonide -glycopyrrolate -formoterol  (BREZTRI  AEROSPHERE) 160-9-4.8 MCG/ACT AERO inhaler Inhale 2 puffs into the lungs in the morning, at noon, and at bedtime. Patient taking differently: Inhale 2 puffs into the lungs in the morning and at bedtime. 03/08/24   Jillian Buttery, MD  cyclobenzaprine  (FLEXERIL ) 10 MG tablet Take 1 tablet (10 mg total) by mouth 2 (two) times daily as needed for muscle spasms. Patient not taking: Reported on 04/11/2024 02/10/24   Dreama Longs, MD  gabapentin  (NEURONTIN ) 300 MG capsule Take 1 capsule (300 mg total) by mouth 3 (three) times daily. Patient not taking: Reported on 04/11/2024 03/23/24   Pokhrel, Laxman, MD  guaiFENesin  (MUCINEX ) 600 MG 12 hr tablet Take 1 tablet (600 mg total) by mouth 2 (two) times daily. Patient not taking: Reported on 04/11/2024 03/08/24 03/08/25  Jillian Buttery, MD  ipratropium-albuterol  (  DUONEB) 0.5-2.5 (3) MG/3ML SOLN Take 3 mLs by nebulization in the morning and at bedtime. 02/29/24   [provider]  levothyroxine  (SYNTHROID ) 125 MCG tablet Take 125 mcg by mouth daily before breakfast.    [provider]  lidocaine  (LIDODERM ) 5 % Place 1 patch onto the skin daily. Remove & Discard patch within 12 hours or as directed by MD Patient not taking: Reported on 04/11/2024 02/10/24   Dreama Longs, MD  losartan  (COZAAR ) 25 MG tablet Take 12.5 mg by mouth daily. Patient not taking: Reported on 04/11/2024 02/09/24   [provider]   metoprolol  succinate (TOPROL -XL) 25 MG 24 hr tablet Take 12.5 mg by mouth daily. Patient not taking: Reported on 04/11/2024    [provider]  Morphine  Sulfate (MORPHINE  CONCENTRATE) 10 mg / 0.5 ml concentrated solution Take 0.25 mLs (5 mg total) by mouth every 4 (four) hours as needed for severe pain (pain score 7-10). Patient taking differently: Take 2.5 mg by mouth every 4 (four) hours as needed for severe pain (pain score 7-10) or moderate pain (pain score 4-6). 03/23/24 03/23/25  Pokhrel, Laxman, MD  nitroGLYCERIN  (NITROSTAT ) 0.4 MG SL tablet Place 0.4 mg under the tongue every 5 (five) minutes as needed for chest pain.    [provider]  oxyCODONE  (ROXICODONE ) 5 MG immediate release tablet Take 1 tablet (5 mg total) by mouth every 4 (four) hours as needed for severe pain (pain score 7-10) or breakthrough pain. Patient not taking: Reported on 04/11/2024 03/23/24 03/23/25  Sonjia Held, MD  OXYGEN  Inhale 2 L/min into the lungs continuous.    [provider]  polyvinyl alcohol  (LIQUIFILM TEARS) 1.4 % ophthalmic solution Place 1 drop into the left eye in the morning, at noon, in the evening, and at bedtime.    [provider]  prednisoLONE  acetate (PRED FORTE ) 1 % ophthalmic suspension Place 1 drop into the left eye in the morning and at bedtime. 11/19/23   [provider]  predniSONE  (DELTASONE ) 10 MG tablet Take 4 pills daily for 2 days then 3 pills daily for 3 days then 2 pills daily for 3 days then continue taking 1 pill daily Patient not taking: Reported on 03/10/2024 03/09/24   Jillian Buttery, MD  rosuvastatin  (CRESTOR ) 20 MG tablet Take 20 mg by mouth daily.    [provider]  sertraline  (ZOLOFT ) 25 MG tablet Take 25 mg by mouth in the morning.    [provider]  VOLTAREN ARTHRITIS PAIN 1 % GEL Apply 1 Application topically as needed (pain). Patient not taking: Reported on 03/10/2024 03/09/24   [provider]    Allergies:  Red dye #40 (allura red), Strawberry extract, Tomato, Aspirin, Tape, and Wound dressing adhesive    Review of Systems  Updated Vital Signs BP (!) 132/90 (BP Location: Right Arm)   Pulse (!) 125   Temp 98.2 F (36.8 C) (Oral)   Resp 18   SpO2 (!) 87%   Physical Exam  (all labs ordered are listed, but only abnormal results are displayed) Labs Reviewed  BASIC METABOLIC PANEL WITH GFR  CBC    EKG: None  Radiology: No results found.  {Document cardiac monitor, telemetry assessment procedure when appropriate:32947} Procedures   Medications Ordered in the ED - No data to display    {Click here for ABCD2, HEART and other calculators REFRESH Note before signing:1}  Medical Decision Making Amount and/or Complexity of Data Reviewed Labs: ordered. Radiology: ordered.  Risk Prescription drug management.   ***    Labs completed and personally evaluated interpreted by me show a leukocytosis, no anemia, no clinically significant electrolyte abnormalities.    Given pleuritic pain, patient reporting she is off of her Eliquis , cancer history, CT PE study was completed which showed no evidence of PE.  Shows stable lobectomy changes, stable pulmonary nodule, stable tree-in-bud opacities in the left lower lobe likely infectious/inflammatory.  She has new mild compression deformities of T11, T12 and L5 and a nonobstructing right renal calculus.  Dedicated CT lumbar spine and thoracic spine show an acute appearing superior endplate fracture of T8 with 20 to 30% loss of height and no retropulsion, new superior endplate fracture of L5 since prior exam August 2 with 20 to 30% loss of height and no retropulsion, progressive loss of height of the L1 vertebral body without retropulsion, stable superior endplate fractures of T11, T12, L2, L3 and L4 and a stable grade 2 anterior listhesis of L5-S1 with severe and moderate to severe right neuroforaminal narrowing with  probable impingement of the exiting L5 nerve roots bilaterally.  Will admit to hospital in setting of acute fracture T8 with need for pain control, rehab placement.  {Document critical care time when appropriate  Document review of labs and clinical decision tools ie CHADS2VASC2, etc  Document your independent review of radiology images and any outside records  Document your discussion with family members, caretakers and with consultants  Document social determinants of health affecting pt's care  Document your decision making why or why not admission, treatments were needed:32947:::1}   Final diagnoses:  None    ED Discharge Orders     None

## 2024-05-12 NOTE — ED Triage Notes (Signed)
 Pt BIB EMS from home for lower back pain that radiated to the right flank. Pt was reaching in her cabinet and that's when the pain started. Pt stated it feels like when she broke her back. Pt also had a spinal tap last week.   200mcg fentanyl  given  120 hr 128/80  20g L hand

## 2024-05-12 NOTE — H&P (Signed)
 History and Physical    Patient: Tracy Park FMW:[id number] DOB: 1956-06-15 DOA: 05/12/2024 DOS: the patient was seen and examined on 05/12/2024 PCP: Corlis Longs, FNP  Patient coming from: Home  Chief Complaint:  Chief Complaint  Patient presents with   Back Pain   HPI: NAHLIA Park is a 68 y.o. female with medical history significant of COPD, chronic hypoxemic respiratory failure, adenocarcinoma of the lung status post left upper lobectomy, paroxysmal A-fib, hypothyroidism, anxiety, PTSD, who had recent hospitalization in 7/25 for an L3 compression fracture.  Patient had kyphoplasty and was subsequently able to discharge to rehab.  She just got home from the rehab a day ago. Patient tried to reach for something in her refrigerator when she developed severe right sided back pain.  She was not able to move much afterwards.  She does not report any falls.  She is enrolled with hospice, she called on-call person for hospice, was recommended to come to the hospital for her pain.  She came by EMS. Patient does report some productive cough, however denies any worsening shortness of breath, chest pain, palpitations, nausea, vomiting, fever, chills.  She does report some radiation of the pain into her right lower extremity.  In the ER, patient's CBC shows somewhat elevated WBC count of 15.8.  BMP appears to be unremarkable.  CT scan of the thoracic and lumbar spine shows acute appearing superior endplate fracture of T8.  There is also a new superior endplate fracture of L5 since prior examination, this fracture may be subacute in nature.  Progressive loss of height L1 vertebral body.  Stable superior endplate fractures of T11, T12, L2, L3, L4 status post vertebroplasty at L3 and L4.  Stable grade 2 anterolisthesis L5-S1 with resultant severe left and moderate to severe right neuroforaminal narrowing with probable impingement of the exiting L5 nerve roots bilaterally.  CT angio pulmonary and CT  abdomen and pelvis with no evidence of PE.  Stable left upper lobectomy changes.  Stable left upper lobe nodule.  Stable tree-in-bud opacity left lower lobe likely infectious/inflammatory.  New mild compression fracture deformities T11, T12, L5.  Nonobstructing right renal calculus  Review of Systems: As mentioned in the history of present illness. All other systems reviewed and are negative. Past Medical History:  Diagnosis Date   Adenocarcinoma of lung (HCC) 02/26/2022   Anginal pain (HCC)    Anxiety    Bipolar disorder (HCC)    Cancer (HCC)    COPD (chronic obstructive pulmonary disease) (HCC)    Dyspnea    Family history of adverse reaction to anesthesia    History of depression 08/03/2023   History of kidney stones    Hydroureteronephrosis 08/16/2021   Hypothyroidism    Lung cancer (HCC)    Malignant neoplasm of upper lobe of left lung (HCC) 11/12/2016   Myocardial infarction (HCC)    Paroxysmal atrial fibrillation (HCC)    Paroxysmal atrial fibrillation with RVR (HCC) 01/25/2023   PTSD (post-traumatic stress disorder)    Rib pain on left side 06/24/2023   Sleep apnea    Thyroid  disease    Past Surgical History:  Procedure Laterality Date   ABDOMINAL HYSTERECTOMY     BACK SURGERY     CYSTOSCOPY W/ URETERAL STENT PLACEMENT Right 05/03/2021   Procedure: CYSTOSCOPY WITH RETROGRADE PYELOGRAM/URETERAL STENT PLACEMENT;  Surgeon: Cam Morene ORN, MD;  Location: [facility];  Service: Urology;  Laterality: Right;   EYE SURGERY     IR KYPHO EA ADDL LEVEL  THORACIC OR LUMBAR  03/17/2024   IR KYPHO EA ADDL LEVEL THORACIC OR LUMBAR  03/17/2024   kidney stent     thyroidectomy     Social History:  reports that she has been smoking cigarettes. She has never used smokeless tobacco. She reports that she does not currently use alcohol . She reports that she does not currently use drugs.  Allergies  Allergen Reactions   Red Dye #40 (Allura Red) Hives, Itching and Other (See Comments)    Red  food dye   Strawberry Extract Hives and Itching   Tomato Hives and Itching   Aspirin Hives   Tape Rash and Other (See Comments)    Prefers paper tape   Wound Dressing Adhesive Rash    Family History  Problem Relation Age of Onset   Hypertension Mother    Heart failure Mother    Hypertension Father    Diabetes Father    Heart failure Father     Prior to Admission medications   Medication Sig Start Date End Date Taking? Authorizing Provider  albuterol  (VENTOLIN  HFA) 108 (90 Base) MCG/ACT inhaler Inhale 2 puffs into the lungs every 6 (six) hours as needed for wheezing or shortness of breath. 03/08/24   Jillian Buttery, MD  apixaban  (ELIQUIS ) 5 MG TABS tablet Take 1 tablet (5 mg total) by mouth 2 (two) times daily. Patient not taking: Reported on 03/03/2024 06/06/23   Barbarann Nest, MD  budesonide -glycopyrrolate -formoterol  (BREZTRI  AEROSPHERE) 160-9-4.8 MCG/ACT AERO inhaler Inhale 2 puffs into the lungs in the morning, at noon, and at bedtime. Patient taking differently: Inhale 2 puffs into the lungs in the morning and at bedtime. 03/08/24   Jillian Buttery, MD  cyclobenzaprine  (FLEXERIL ) 10 MG tablet Take 1 tablet (10 mg total) by mouth 2 (two) times daily as needed for muscle spasms. Patient not taking: Reported on 04/11/2024 02/10/24   Dreama Longs, MD  gabapentin  (NEURONTIN ) 300 MG capsule Take 1 capsule (300 mg total) by mouth 3 (three) times daily. Patient not taking: Reported on 04/11/2024 03/23/24   Pokhrel, Laxman, MD  guaiFENesin  (MUCINEX ) 600 MG 12 hr tablet Take 1 tablet (600 mg total) by mouth 2 (two) times daily. Patient not taking: Reported on 04/11/2024 03/08/24 03/08/25  Jillian Buttery, MD  ipratropium-albuterol  (DUONEB) 0.5-2.5 (3) MG/3ML SOLN Take 3 mLs by nebulization in the morning and at bedtime. 02/29/24   [provider]  levothyroxine  (SYNTHROID ) 125 MCG tablet Take 125 mcg by mouth daily before breakfast.    [provider]  lidocaine  (LIDODERM ) 5 %  Place 1 patch onto the skin daily. Remove & Discard patch within 12 hours or as directed by MD Patient not taking: Reported on 04/11/2024 02/10/24   Dreama Longs, MD  losartan  (COZAAR ) 25 MG tablet Take 12.5 mg by mouth daily. Patient not taking: Reported on 04/11/2024 02/09/24   [provider]  metoprolol  succinate (TOPROL -XL) 25 MG 24 hr tablet Take 12.5 mg by mouth daily. Patient not taking: Reported on 04/11/2024    [provider]  Morphine  Sulfate (MORPHINE  CONCENTRATE) 10 mg / 0.5 ml concentrated solution Take 0.25 mLs (5 mg total) by mouth every 4 (four) hours as needed for severe pain (pain score 7-10). Patient taking differently: Take 2.5 mg by mouth every 4 (four) hours as needed for severe pain (pain score 7-10) or moderate pain (pain score 4-6). 03/23/24 03/23/25  Pokhrel, Laxman, MD  nitroGLYCERIN  (NITROSTAT ) 0.4 MG SL tablet Place 0.4 mg under the tongue every 5 (five) minutes as  needed for chest pain.    [provider]  oxyCODONE  (ROXICODONE ) 5 MG immediate release tablet Take 1 tablet (5 mg total) by mouth every 4 (four) hours as needed for severe pain (pain score 7-10) or breakthrough pain. Patient not taking: Reported on 04/11/2024 03/23/24 03/23/25  Sonjia Held, MD  OXYGEN  Inhale 2 L/min into the lungs continuous.    [provider]  polyvinyl alcohol  (LIQUIFILM TEARS) 1.4 % ophthalmic solution Place 1 drop into the left eye in the morning, at noon, in the evening, and at bedtime.    [provider]  prednisoLONE  acetate (PRED FORTE ) 1 % ophthalmic suspension Place 1 drop into the left eye in the morning and at bedtime. 11/19/23   [provider]  predniSONE  (DELTASONE ) 10 MG tablet Take 4 pills daily for 2 days then 3 pills daily for 3 days then 2 pills daily for 3 days then continue taking 1 pill daily Patient not taking: Reported on 03/10/2024 03/09/24   Jillian Buttery, MD  rosuvastatin  (CRESTOR ) 20 MG tablet Take 20 mg by mouth  daily.    [provider]  sertraline  (ZOLOFT ) 25 MG tablet Take 25 mg by mouth in the morning.    [provider]  VOLTAREN ARTHRITIS PAIN 1 % GEL Apply 1 Application topically as needed (pain). Patient not taking: Reported on 03/10/2024 03/09/24   [provider]    Physical Exam: Vitals:   05/12/24 1700 05/12/24 1832 05/12/24 2008 05/12/24 2215  BP: (!) 132/90 127/67 (!) 127/91 124/81  Pulse: (!) 125 (!) 106 (!) 103 94  Resp: 18 (!) 25 20 16   Temp: 98.2 F (36.8 C) 98 F (36.7 C)  97.9 F (36.6 C)  TempSrc: Oral Oral  Oral  SpO2: (!) 87% 95% 95% 94%   Physical Exam HENT:     Head: Normocephalic.     Nose: Nose normal.     Mouth/Throat:     Mouth: Mucous membranes are moist.  Eyes:     Pupils: Pupils are equal, round, and reactive to light.  Cardiovascular:     Rate and Rhythm: Tachycardia present.  Pulmonary:     Effort: Pulmonary effort is normal.     Breath sounds: Wheezing present.  Abdominal:     General: Abdomen is flat. Bowel sounds are normal.     Palpations: Abdomen is soft.  Musculoskeletal:        General: Normal range of motion.     Cervical back: Normal range of motion.  Skin:    General: Skin is warm.     Capillary Refill: Capillary refill takes less than 2 seconds.  Neurological:     General: No focal deficit present.     Mental Status: She is alert.  Psychiatric:        Mood and Affect: Mood normal.     Data Reviewed:  I personally reviewed patient's vital signs, lab results, imaging.  Assessment and Plan: No notes have been filed under this hospital service. Service: Hospitalist #1.  Acute on chronic back pain due to T8 compression fracture.  As mentioned above, patient's imaging reveals an acute appearing T8 compression fracture, also appears to have a new subacute fracture of the L5.  She also appears to have had previous multiple compression fractures and has had kyphoplasty recently.  She continues to smoke. Will  admit, use as needed IV Dilaudid  as well as as needed oral pain medications to control patient's pain.  PT/OT consultation. Consider IR consultation  again in the morning however not sure if she will be a candidate for repeat kyphoplasty. Will also place a consult to palliative care.  #2.  COPD/chronic hypoxemic hypercapnic respiratory failure.  Patient does report some productive cough, however denies any worsening.  Will continue patient's inhalers, continue with as needed albuterol .  Will also place on a short course of doxycycline .  #3.  Paroxysmal A-fib.  Monitor telemetry.  Patient at this time is unsure if she takes Eliquis .  According to the previous documentation, she was continued on Eliquis  upon discharge to rehab.  Will hold Eliquis  today in case she requires any procedure for her fractures.  Continue with Toprol -XL.  #4.  Hypothyroidism.  Continue Synthroid .  #5.  Hyperlipidemia.  Continue with statin.  #6 anxiety.  Continue with sertraline .  #.  Moderate protein calorie malnutrition.    Advance Care Planning:   Code Status: Limited: Do not attempt resuscitation (DNR) -DNR-LIMITED -Do Not Intubate/DNI    Consults:   Family Communication:   Severity of Illness: The appropriate patient status for this patient is OBSERVATION. Observation status is judged to be reasonable and necessary in order to provide the required intensity of service to ensure the patient's safety. The patient's presenting symptoms, physical exam findings, and initial radiographic and laboratory data in the context of their medical condition is felt to place them at decreased risk for further clinical deterioration. Furthermore, it is anticipated that the patient will be medically stable for discharge from the hospital within 2 midnights of admission.   Author: Amena Dockham A Raylin Diguglielmo, MD 05/12/2024 10:30 PM  For on call review [email or web address].

## 2024-05-13 ENCOUNTER — Observation Stay (HOSPITAL_COMMUNITY)

## 2024-05-13 DIAGNOSIS — F17210 Nicotine dependence, cigarettes, uncomplicated: Secondary | ICD-10-CM | POA: Diagnosis present

## 2024-05-13 DIAGNOSIS — S32050A Wedge compression fracture of fifth lumbar vertebra, initial encounter for closed fracture: Secondary | ICD-10-CM | POA: Diagnosis not present

## 2024-05-13 DIAGNOSIS — Z720 Tobacco use: Secondary | ICD-10-CM | POA: Diagnosis not present

## 2024-05-13 DIAGNOSIS — Z79890 Hormone replacement therapy (postmenopausal): Secondary | ICD-10-CM | POA: Diagnosis not present

## 2024-05-13 DIAGNOSIS — E440 Moderate protein-calorie malnutrition: Secondary | ICD-10-CM | POA: Diagnosis present

## 2024-05-13 DIAGNOSIS — F319 Bipolar disorder, unspecified: Secondary | ICD-10-CM | POA: Diagnosis present

## 2024-05-13 DIAGNOSIS — R52 Pain, unspecified: Secondary | ICD-10-CM | POA: Diagnosis not present

## 2024-05-13 DIAGNOSIS — F32A Depression, unspecified: Secondary | ICD-10-CM

## 2024-05-13 DIAGNOSIS — M4854XA Collapsed vertebra, not elsewhere classified, thoracic region, initial encounter for fracture: Secondary | ICD-10-CM | POA: Diagnosis not present

## 2024-05-13 DIAGNOSIS — Z85118 Personal history of other malignant neoplasm of bronchus and lung: Secondary | ICD-10-CM | POA: Diagnosis not present

## 2024-05-13 DIAGNOSIS — Z7189 Other specified counseling: Secondary | ICD-10-CM

## 2024-05-13 DIAGNOSIS — Z886 Allergy status to analgesic agent status: Secondary | ICD-10-CM | POA: Diagnosis not present

## 2024-05-13 DIAGNOSIS — Z7901 Long term (current) use of anticoagulants: Secondary | ICD-10-CM | POA: Diagnosis not present

## 2024-05-13 DIAGNOSIS — J9611 Chronic respiratory failure with hypoxia: Secondary | ICD-10-CM | POA: Diagnosis present

## 2024-05-13 DIAGNOSIS — R531 Weakness: Secondary | ICD-10-CM

## 2024-05-13 DIAGNOSIS — F4310 Post-traumatic stress disorder, unspecified: Secondary | ICD-10-CM | POA: Diagnosis present

## 2024-05-13 DIAGNOSIS — Z66 Do not resuscitate: Secondary | ICD-10-CM | POA: Diagnosis present

## 2024-05-13 DIAGNOSIS — Z833 Family history of diabetes mellitus: Secondary | ICD-10-CM | POA: Diagnosis not present

## 2024-05-13 DIAGNOSIS — I10 Essential (primary) hypertension: Secondary | ICD-10-CM | POA: Diagnosis present

## 2024-05-13 DIAGNOSIS — Z8249 Family history of ischemic heart disease and other diseases of the circulatory system: Secondary | ICD-10-CM | POA: Diagnosis not present

## 2024-05-13 DIAGNOSIS — J9612 Chronic respiratory failure with hypercapnia: Secondary | ICD-10-CM

## 2024-05-13 DIAGNOSIS — Z79899 Other long term (current) drug therapy: Secondary | ICD-10-CM | POA: Diagnosis not present

## 2024-05-13 DIAGNOSIS — S22069A Unspecified fracture of T7-T8 vertebra, initial encounter for closed fracture: Secondary | ICD-10-CM

## 2024-05-13 DIAGNOSIS — I480 Paroxysmal atrial fibrillation: Secondary | ICD-10-CM | POA: Diagnosis present

## 2024-05-13 DIAGNOSIS — Z902 Acquired absence of lung [part of]: Secondary | ICD-10-CM | POA: Diagnosis not present

## 2024-05-13 DIAGNOSIS — J449 Chronic obstructive pulmonary disease, unspecified: Secondary | ICD-10-CM | POA: Diagnosis present

## 2024-05-13 DIAGNOSIS — F419 Anxiety disorder, unspecified: Secondary | ICD-10-CM

## 2024-05-13 DIAGNOSIS — M549 Dorsalgia, unspecified: Secondary | ICD-10-CM | POA: Diagnosis present

## 2024-05-13 DIAGNOSIS — S22060A Wedge compression fracture of T7-T8 vertebra, initial encounter for closed fracture: Secondary | ICD-10-CM | POA: Diagnosis not present

## 2024-05-13 DIAGNOSIS — G8921 Chronic pain due to trauma: Secondary | ICD-10-CM | POA: Diagnosis present

## 2024-05-13 DIAGNOSIS — Z515 Encounter for palliative care: Secondary | ICD-10-CM | POA: Diagnosis not present

## 2024-05-13 DIAGNOSIS — K5900 Constipation, unspecified: Secondary | ICD-10-CM | POA: Diagnosis not present

## 2024-05-13 DIAGNOSIS — I2510 Atherosclerotic heart disease of native coronary artery without angina pectoris: Secondary | ICD-10-CM | POA: Diagnosis not present

## 2024-05-13 DIAGNOSIS — K5903 Drug induced constipation: Secondary | ICD-10-CM | POA: Diagnosis not present

## 2024-05-13 DIAGNOSIS — H409 Unspecified glaucoma: Secondary | ICD-10-CM | POA: Diagnosis present

## 2024-05-13 DIAGNOSIS — I252 Old myocardial infarction: Secondary | ICD-10-CM | POA: Diagnosis not present

## 2024-05-13 DIAGNOSIS — E785 Hyperlipidemia, unspecified: Secondary | ICD-10-CM | POA: Diagnosis present

## 2024-05-13 DIAGNOSIS — Z9981 Dependence on supplemental oxygen: Secondary | ICD-10-CM | POA: Diagnosis not present

## 2024-05-13 DIAGNOSIS — S22060D Wedge compression fracture of T7-T8 vertebra, subsequent encounter for fracture with routine healing: Secondary | ICD-10-CM | POA: Diagnosis not present

## 2024-05-13 DIAGNOSIS — M8008XA Age-related osteoporosis with current pathological fracture, vertebra(e), initial encounter for fracture: Secondary | ICD-10-CM | POA: Diagnosis present

## 2024-05-13 DIAGNOSIS — S22060S Wedge compression fracture of T7-T8 vertebra, sequela: Secondary | ICD-10-CM | POA: Diagnosis not present

## 2024-05-13 LAB — CBC
HCT: 40.6 % (ref 36.0–46.0)
Hemoglobin: 12.3 g/dL (ref 12.0–15.0)
MCH: 29 pg (ref 26.0–34.0)
MCHC: 30.3 g/dL (ref 30.0–36.0)
MCV: 95.8 fL (ref 80.0–100.0)
Platelets: 207 K/uL (ref 150–400)
RBC: 4.24 MIL/uL (ref 3.87–5.11)
RDW: 14.4 % (ref 11.5–15.5)
WBC: 13.3 K/uL — ABNORMAL HIGH (ref 4.0–10.5)
nRBC: 0 % (ref 0.0–0.2)

## 2024-05-13 LAB — BASIC METABOLIC PANEL WITH GFR
Anion gap: 12 (ref 5–15)
BUN: 14 mg/dL (ref 8–23)
CO2: 28 mmol/L (ref 22–32)
Calcium: 9.7 mg/dL (ref 8.9–10.3)
Chloride: 101 mmol/L (ref 98–111)
Creatinine, Ser: 0.42 mg/dL — ABNORMAL LOW (ref 0.44–1.00)
GFR, Estimated: >60 mL/min (ref >60)
Glucose, Bld: 88 mg/dL (ref 70–99)
Potassium: 4 mmol/L (ref 3.5–5.1)
Sodium: 141 mmol/L (ref 135–145)

## 2024-05-13 LAB — PROTIME-INR
INR: 1 (ref 0.8–1.2)
Prothrombin Time: 13.6 s (ref 11.4–15.2)

## 2024-05-13 MED ORDER — HYDROMORPHONE HCL 1 MG/ML IJ SOLN
1.0000 mg | INTRAMUSCULAR | Status: DC | PRN
  Administered 2024-05-13 – 2024-05-16 (×7): 1 mg via INTRAVENOUS
  Filled 2024-05-13 (×10): qty 1

## 2024-05-13 MED ORDER — LORAZEPAM 2 MG/ML IJ SOLN
1.0000 mg | Freq: Once | INTRAMUSCULAR | Status: AC | PRN
  Administered 2024-05-13: 1 mg via INTRAVENOUS
  Filled 2024-05-13: qty 1

## 2024-05-13 MED ORDER — ENSURE PLUS HIGH PROTEIN PO LIQD
237.0000 mL | Freq: Three times a day (TID) | ORAL | Status: DC
  Administered 2024-05-13 – 2024-05-21 (×17): 237 mL via ORAL

## 2024-05-13 NOTE — TOC Initial Note (Signed)
 Transition of Care [facility]) - Initial/Assessment Note    Patient Details  Name: Tracy Park MRN: [id number] Date of Birth: 1956/06/15  Transition of Care [facility]) CM/SW Contact:    Doneta Glenys DASEN, RN Phone Number: 05/13/2024, 1:48 PM  Clinical Narrative:                 MOON.Presented for back pain. Patient states PTA was discharged from [facility] on 9/1. Lives with Jill sister;Home oxygen  at 2L with [employer];Active with [facility] for palliative care;  Inpatient care management will follow for oxygen  and PT recommendation.  Expected Discharge Plan: Home w Home Health Services Barriers to Discharge: Continued Medical Work up   Patient Goals and CMS Choice Patient states their goals for this hospitalization and ongoing recovery are:: Home with sister CMS Medicare.gov Compare Post Acute Care list provided to::  (NA) Choice offered to / list presented to : NA [place] ownership interest in [facility].provided to:: Parent NA    Expected Discharge Plan and Services In-house Referral: NA Discharge Planning Services: CM Consult Post Acute Care Choice: NA Living arrangements for the past 2 months: Mobile Home                 DME Arranged: N/A DME Agency: NA       HH Arranged: NA HH Agency: NA        Prior Living Arrangements/Services Living arrangements for the past 2 months: Mobile Home Lives with:: Siblings Patient language and need for interpreter reviewed:: Yes Do you feel safe going back to the place where you live?: Yes      Need for Family Participation in Patient Care: No (Comment) Care giver support system in place?: Yes (comment) Current home services: DME (Oxygen  - [employer], rollator) Criminal Activity/Legal Involvement Pertinent to Current Situation/Hospitalization: No - Comment as needed  Activities of Daily Living   ADL Screening (condition at time of admission) Independently performs ADLs?: Yes (appropriate for developmental age) Is the  patient deaf or have difficulty hearing?: No Does the patient have difficulty seeing, even when wearing glasses/contacts?: No Does the patient have difficulty concentrating, remembering, or making decisions?: No  Permission Sought/Granted Permission sought to share information with : Case Manager Permission granted to share information with : Yes, Verbal Permission Granted  Share Information with NAME: [name] (Daughter)  [phone] (  Permission granted to share info w AGENCY: [employer]        Emotional Assessment Appearance:: Appears stated age Attitude/Demeanor/Rapport: Engaged Affect (typically observed): Appropriate Orientation: : Oriented to Self, Oriented to Place, Oriented to  Time, Oriented to Situation Alcohol  / Substance Use: Not Applicable Psych Involvement: No (comment)  Admission diagnosis:  Compression fracture of T8 vertebra (HCC) [S22.060A] Patient Active Problem List   Diagnosis Date Noted   Compression fracture of T8 vertebra (HCC) 05/12/2024   Closed compression fracture of L3 vertebra (HCC) 03/10/2024   Compression fracture of lumbar vertebra (HCC) 03/10/2024   Lumbar compression fracture (HCC) 03/10/2024   Hypokalemia 02/06/2024   Factitious disorder with predominantly psychological signs and symptoms 01/30/2024   Palliative care encounter 01/30/2024   Need for emotional support 01/30/2024   Counseling and coordination of care 01/30/2024   Goals of care, counseling/discussion 01/30/2024   COPD exacerbation (HCC) 01/30/2024   Acute on chronic respiratory failure with hypoxia and hypercapnia (HCC) 12/13/2023   COPD with acute exacerbation 09/22/2023   Paroxysmal atrial fibrillation (HCC) 02/23/2023   Pulmonary nodule 01/25/2023   COPD (chronic obstructive  pulmonary disease) (HCC) 01/25/2023   Essential hypertension 01/25/2023   DDD (degenerative disc disease), cervical 03/24/2022   Bipolar disorder (HCC) 03/24/2022   Dyslipidemia 03/13/2022    Anxiety and depression 03/13/2022   History of lung cancer 03/13/2022   Chronic respiratory failure with hypoxia and hypercapnia (HCC) 02/26/2022   Normocytic anemia 02/08/2022   Malnutrition of moderate degree 08/25/2021   Coronary artery disease involving native coronary artery of native heart without angina pectoris 08/18/2018   Chronic bilateral low back pain without sciatica 08/21/2016   Obstructive sleep apnea (adult) (pediatric) 02/22/2015   Hyperlipidemia 06/03/2013   Vitamin D  deficiency 06/03/2013   Hypothyroidism 04/28/2012   Tobacco use 04/28/2012   PCP:  Corlis Longs, FNP Pharmacy:   [id number] [address], [place] - [address] [place] [address] Phone: [phone] Fax: [phone]  [place] - [facility] [address] [place] [address] Phone: [phone] Fax: [phone]     Social Drivers of Health (SDOH) Social History: SDOH Screenings   Food Insecurity: No Food Insecurity (05/12/2024)  Recent Concern: Food Insecurity - Food Insecurity Present (03/03/2024)  Housing: Low Risk  (05/12/2024)  Transportation Needs: No Transportation Needs (05/12/2024)  Utilities: Not At Risk (05/12/2024)  Financial Resource Strain: High Risk (10/08/2023)   Received from [facility]  Physical Activity: Unknown (04/07/2023)   Received from [facility]  Social Connections: Moderately Isolated (05/12/2024)  Stress: Stress Concern Present (04/07/2023)   Received from [facility]  Tobacco Use: High Risk (05/12/2024)   SDOH Interventions:     Readmission Risk Interventions    03/13/2024    3:54 PM 03/04/2024    2:47 PM 01/14/2024    2:11 PM  Readmission Risk Prevention Plan  Transportation Screening Complete Complete Complete  Medication Review Oceanographer) Complete Complete Complete  PCP or Specialist appointment within 3-5 days of discharge Complete Complete Complete  HRI or Home  Care Consult Complete Complete --  SW Recovery Care/Counseling Consult Complete Complete --  Palliative Care Screening Complete Not Applicable Not Applicable  Skilled Nursing Facility Not Applicable Not Applicable Not Applicable

## 2024-05-13 NOTE — Plan of Care (Signed)

## 2024-05-13 NOTE — Progress Notes (Signed)
 PROGRESS NOTE    Tracy Park  FMW:[id number] DOB: 02/12/1956 DOA: 05/12/2024 PCP: Tracy Longs, FNP   Brief Narrative:  Tracy Park is a 68 y.o. female with medical history significant of COPD, chronic hypoxemic respiratory failure, adenocarcinoma of the lung status post left upper lobectomy, paroxysmal A-fib, hypothyroidism, anxiety, PTSD, who had recent hospitalization in 7/25 for an L3 compression fracture.  She presents with right-sided back now and there is concern for new T8 compression fracture as well as L5 subacute fracture.  Obtaining MRI of the T-spine and L-spine and I have been consulted for further evaluation.  Palliative is also been consulted for further pain control.  Assessment and Plan:  Acute on Chronic Back Pain due to Compression Fractures: Presents with significant Back Pain. CT Scan done and showed Acute appearing T8 Compression Fx and subacute LF Compression Fx of L5 since prior examination of 04/10/2024 with approximately 20-30% loss of height and no retropulsion Noted to also have Progressive loss of height of the L1 vertebral body now 10-20% with no retropulsion and . Stable superior endplate fractures of T11, T12, L2, L3, and L4 status post vertebroplasty at L3 and L4. The CT Scans also mentioned stable grade 2 anterolisthesis L5-S1 with resultant severe left and moderate to severe right neuroforaminal narrowing with probable impingement of the exiting L5 nerve roots bilaterally. She continues to smoke. Admitted for Pain Control.  On gentle IV fluid hydration with LR at 40 mL/h for 1 day.  Continue with Acetaminophen  650 mg po q6hprn Mild Pain, Hydrocodone -Acetaminophen  1-2 po q6hprn Moderate Pain and Hydromorphone  0.5 mg q4hprn Severe Pain.  PT/OT consultation. IR consulted in the morning and they are requesting an MRI T and L Spine to see if she will be a candidate for repeat kyphoplasty. Will also place a consult to Palliative Care. Palliative Care added K Pad  Aquathermia; Currently enrolled w/ [facility] and will continue Hospice Support after D/C.    COPD/Chronic Hypoxemic Hypercapnic Respiratory Failure / Adenocarcinoma of the Lung s/p Left Upper Lobe Lobectomy: Patient does report some productive cough, however denies any worsening.  Will continue patient's inhaler w/ Breztri  160-9-4.8 mcg/act IH 2 puff, Duoneb 3 mL BID and continue with as needed Albuterol  3 mL IH q6hprn Wheezing and SOB. Will also place on a short course of Doxycycline . Will consider Steroids pending Kyphoplasty   Leukocytosis: Mild and improving. WBC went from 15.8 -> 13.3. CTM and Trend and repeat CBC in the AM   Paroxysmal A-fib: CTM on Telemetry. Patient at this time is unsure if she takes Apixaban .  According to the previous documentation, she was continued on Eliquis  upon discharge to rehab.  Will hold Apixban for now in case she requires any procedure for her fractures.  C/w Metoprolol  Succinate 12.5 mg po daily    Hypothyroidism: Check TSH in the AM. Continue Levothyroxine  125 mcg po Daily .   Hyperlipidemia: C/w Rosuvastatin  20 mg po Daily   Essential HTN: Hold Losartan  12.5 mg po daily; C/w Metoprolol   Succinate 12.5 mg po Daily. CTM BP per Protocol. Last BP reading was 129/76   Anxiety and Depression and PTSD: Continue w/ Sertraline  25 mg po Daily  Glaucoma: C/w Prednisolone  1% Ophthalmic    Moderate Protein Calorie Malnutrition: Dietitian consulted for further evaluation. C/w Ensure Plus High Protein 237  Tobacco Abuse: Smoking Cessation Counseling given. C/w Nicotine  14 mg TD q24h   DVT prophylaxis: enoxaparin  (LOVENOX ) injection 40 mg Start: 05/13/24 1000 Place TED hose Start:  05/12/24 2223    Code Status: Limited: Do not attempt resuscitation (DNR) -DNR-LIMITED -Do Not Intubate/DNI  Family Communication: No family present @ bedside  Disposition Plan:  Level of care: Telemetry Status is: Inpatient Remains inpatient appropriate because: Needs further  clinical improvement and clearance by the specialists   Consultants:  IR Palliative Care Medicine   Procedures:  As delineated as above  Antimicrobials:  Anti-infectives (From admission, onward)    Start     Dose/Rate Route Frequency Ordered Stop   05/12/24 2245  doxycycline  (VIBRA -TABS) tablet 100 mg        100 mg Oral Every 12 hours 05/12/24 2230 05/19/24 2159       Subjective: Seen and examined bedside and she is still complaining some right-sided back pain and had some shortness of breath.  No nausea or vomiting.  Feels okay otherwise.  Denies any lightheadedness or dizziness.  No other concerns or complaints at this time.  Objective: Vitals:   05/13/24 0806 05/13/24 0819 05/13/24 0941 05/13/24 1222  BP:  137/70 117/79 129/76  Pulse:  93 84 84  Resp:  18  18  Temp:  98.4 F (36.9 C)  97.7 F (36.5 C)  TempSrc:  Oral  Oral  SpO2: 96%     Weight:      Height:        Intake/Output Summary (Last 24 hours) at 05/13/2024 1547 Last data filed at 05/13/2024 1300 Gross per 24 hour  Intake 708.31 ml  Output 450 ml  Net 258.31 ml   Filed Weights   05/12/24 2300  Weight: 52.2 kg   Examination: Physical Exam:  Constitutional: Thin older appearing than her stated age and chronically ill-appearing Caucasian female who is a little uncomfortable Respiratory: Diminished to auscultation bilaterally with some coarse breath sounds and does have some rhonchi and some expiratory wheezing but no appreciable crackles or rales. Normal respiratory effort and patient is not tachypenic. No accessory muscle use. Wearing Supplemental O2 via [place] Cardiovascular: RRR, no murmurs / rubs / gallops. S1 and S2 auscultated. No extremity edema.  Abdomen: Soft, non-tender, non-distended. Bowel sounds positive.  GU: Deferred. Musculoskeletal: No clubbing / cyanosis of digits/nails. No joint deformity upper and lower extremities.  Skin: No rashes, lesions, ulcers on a limited skin evaluation. No  induration; Warm and dry.  Neurologic: CN 2-12 grossly intact with no focal deficits. Romberg sign and cerebellar reflexes not assessed.  Psychiatric: Normal judgment and insight. Alert and oriented x 3. Normal mood and appropriate affect.   Data Reviewed: I have personally reviewed following labs and imaging studies  CBC: Recent Labs  Lab 05/12/24 1712 05/13/24 0528  WBC 15.8* 13.3*  HGB 12.4 12.3  HCT 41.2 40.6  MCV 94.1 95.8  PLT 245 207   Basic Metabolic Panel: Recent Labs  Lab 05/12/24 1712 05/13/24 0528  NA 143 141  K 3.6 4.0  CL 103 101  CO2 27 28  GLUCOSE 110* 88  BUN 13 14  CREATININE 0.49 0.42*  CALCIUM  9.8 9.7   GFR: Estimated Creatinine Clearance: 57 mL/min (A) (by C-G formula based on SCr of 0.42 mg/dL (L)). Liver Function Tests: No results for input(s): AST, ALT, ALKPHOS, BILITOT, PROT, ALBUMIN in the last 168 hours. No results for input(s): LIPASE, AMYLASE in the last 168 hours. No results for input(s): AMMONIA in the last 168 hours. Coagulation Profile: Recent Labs  Lab 05/13/24 0528  INR 1.0   Cardiac Enzymes: No results for input(s): CKTOTAL, CKMB, CKMBINDEX, TROPONINI  in the last 168 hours. BNP (last 3 results) No results for input(s): PROBNP in the last 8760 hours. HbA1C: No results for input(s): HGBA1C in the last 72 hours. CBG: No results for input(s): GLUCAP in the last 168 hours. Lipid Profile: No results for input(s): CHOL, HDL, LDLCALC, TRIG, CHOLHDL, LDLDIRECT in the last 72 hours. Thyroid  Function Tests: No results for input(s): TSH, T4TOTAL, FREET4, T3FREE, THYROIDAB in the last 72 hours. Anemia Panel: No results for input(s): VITAMINB12, FOLATE, FERRITIN, TIBC, IRON , RETICCTPCT in the last 72 hours. Sepsis Labs: No results for input(s): PROCALCITON, LATICACIDVEN in the last 168 hours.  No results found for this or any previous visit (from the past 240 hours).    Radiology Studies: CT L-SPINE NO CHARGE Result Date: 05/12/2024 CLINICAL DATA:  Back pain EXAM: CT Thoracic and Lumbar spine with contrast TECHNIQUE: Multiplanar CT images of the thoracic and lumbar spine were reconstructed from contemporary CT of the Chest, Abdomen, and Pelvis. RADIATION DOSE REDUCTION: This exam was performed according to the departmental dose-optimization program which includes automated exposure control, adjustment of the mA and/or kV according to patient size and/or use of iterative reconstruction technique. CONTRAST:  No additional contrast was administered for these reconstructions. COMPARISON:  CT lumbar spine 04/10/2024, CT chest 01/10/2024 FINDINGS: CT THORACIC SPINE FINDINGS Alignment: Normal. Vertebrae: There is an acute appearing superior endplate fracture of T8 with approximately 20-30% loss of height and no retropulsion. The posterior elements appear intact. There are superior endplate fractures of T11 and T12 with approximately 10-20% loss of height which appear chronic in nature. Remaining vertebral body height is preserved. Osseous structures are mildly osteopenic. Paraspinal and other soft tissues: There is mild paravertebral soft tissue swelling in keeping with edema or interstitial hemorrhage anterior to T8 the paraspinal soft tissues are otherwise unremarkable. See accompanying report for CT examination of the chest, abdomen, and pelvis. Disc levels: There is mild endplate remodeling and vacuum disc phenomena at T10-11 and T12-L1 in keeping with changes of mild to moderate degenerative disc disease. No high-grade canal stenosis. No significant neuroforaminal narrowing. CT LUMBAR SPINE FINDINGS Segmentation: Transitional lumbar anatomy with a partially lumbarized S1. Consistent numbering system utilized based on the presence of a fully formed disc at L5-S1. Alignment: Grade 2 anterolisthesis L5-S1 approximately 8 mm, stable. Otherwise normal lumbar lordosis Vertebrae:  Superior endplate fracture of L1 again seen with progressive sclerosis and loss of height of the L1 vertebral body now 10-20%. No retropulsion. Posterior elements are intact. Stable superior endplate fracture of L2. Stable superior endplate fractures of L3 and L4 status post vertebroplasty. New superior endplate fracture of L5 with approximately 20-30% loss of height and no retropulsion. Posterior elements appear intact at this level. Paraspinal and other soft tissues: Lack of significant soft tissue swelling surrounding the L5 vertebral body suggests this fracture may be subacute in nature. No paraspinal fluid collection is seen. Refer to dictated report for CT examination of the chest, abdomen, pelvis for visceral findings. Disc levels: Vacuum disc phenomena L1-2 and disc space narrowing at L5-S1 is present in keeping with changes of moderate degenerative disc disease. Remaining disc heights are preserved. No high-grade canal stenosis. Disc height loss, un covered disc material, and anterolisthesis at L5-S1 result in severe left and moderate to severe right neuroforaminal narrowing with probable impingement of the exiting L5 nerve roots bilaterally. IMPRESSION: 1. Acute appearing superior endplate fracture of T8 with approximately 20-30% loss of height and no retropulsion. 2. New superior endplate fracture of L5  since prior examination of 04/10/2024 with approximately 20-30% loss of height and no retropulsion. Lack of significant soft tissue swelling surrounding the L5 vertebral body suggests this fracture may be subacute in nature. 3. Progressive loss of height of the L1 vertebral body now 10-20%. No retropulsion. 4. Stable superior endplate fractures of T11, T12, L2, L3, and L4 status post vertebroplasty at L3 and L4. 5. Stable grade 2 anterolisthesis L5-S1 with resultant severe left and moderate to severe right neuroforaminal narrowing with probable impingement of the exiting L5 nerve roots bilaterally.  Electronically Signed   By: Dorethia Molt M.D.   On: 05/12/2024 20:54   CT T-SPINE NO CHARGE Result Date: 05/12/2024 CLINICAL DATA:  Back pain EXAM: CT Thoracic and Lumbar spine with contrast TECHNIQUE: Multiplanar CT images of the thoracic and lumbar spine were reconstructed from contemporary CT of the Chest, Abdomen, and Pelvis. RADIATION DOSE REDUCTION: This exam was performed according to the departmental dose-optimization program which includes automated exposure control, adjustment of the mA and/or kV according to patient size and/or use of iterative reconstruction technique. CONTRAST:  No additional contrast was administered for these reconstructions. COMPARISON:  CT lumbar spine 04/10/2024, CT chest 01/10/2024 FINDINGS: CT THORACIC SPINE FINDINGS Alignment: Normal. Vertebrae: There is an acute appearing superior endplate fracture of T8 with approximately 20-30% loss of height and no retropulsion. The posterior elements appear intact. There are superior endplate fractures of T11 and T12 with approximately 10-20% loss of height which appear chronic in nature. Remaining vertebral body height is preserved. Osseous structures are mildly osteopenic. Paraspinal and other soft tissues: There is mild paravertebral soft tissue swelling in keeping with edema or interstitial hemorrhage anterior to T8 the paraspinal soft tissues are otherwise unremarkable. See accompanying report for CT examination of the chest, abdomen, and pelvis. Disc levels: There is mild endplate remodeling and vacuum disc phenomena at T10-11 and T12-L1 in keeping with changes of mild to moderate degenerative disc disease. No high-grade canal stenosis. No significant neuroforaminal narrowing. CT LUMBAR SPINE FINDINGS Segmentation: Transitional lumbar anatomy with a partially lumbarized S1. Consistent numbering system utilized based on the presence of a fully formed disc at L5-S1. Alignment: Grade 2 anterolisthesis L5-S1 approximately 8 mm,  stable. Otherwise normal lumbar lordosis Vertebrae: Superior endplate fracture of L1 again seen with progressive sclerosis and loss of height of the L1 vertebral body now 10-20%. No retropulsion. Posterior elements are intact. Stable superior endplate fracture of L2. Stable superior endplate fractures of L3 and L4 status post vertebroplasty. New superior endplate fracture of L5 with approximately 20-30% loss of height and no retropulsion. Posterior elements appear intact at this level. Paraspinal and other soft tissues: Lack of significant soft tissue swelling surrounding the L5 vertebral body suggests this fracture may be subacute in nature. No paraspinal fluid collection is seen. Refer to dictated report for CT examination of the chest, abdomen, pelvis for visceral findings. Disc levels: Vacuum disc phenomena L1-2 and disc space narrowing at L5-S1 is present in keeping with changes of moderate degenerative disc disease. Remaining disc heights are preserved. No high-grade canal stenosis. Disc height loss, un covered disc material, and anterolisthesis at L5-S1 result in severe left and moderate to severe right neuroforaminal narrowing with probable impingement of the exiting L5 nerve roots bilaterally. IMPRESSION: 1. Acute appearing superior endplate fracture of T8 with approximately 20-30% loss of height and no retropulsion. 2. New superior endplate fracture of L5 since prior examination of 04/10/2024 with approximately 20-30% loss of height and no retropulsion. Lack  of significant soft tissue swelling surrounding the L5 vertebral body suggests this fracture may be subacute in nature. 3. Progressive loss of height of the L1 vertebral body now 10-20%. No retropulsion. 4. Stable superior endplate fractures of T11, T12, L2, L3, and L4 status post vertebroplasty at L3 and L4. 5. Stable grade 2 anterolisthesis L5-S1 with resultant severe left and moderate to severe right neuroforaminal narrowing with probable  impingement of the exiting L5 nerve roots bilaterally. Electronically Signed   By: Dorethia Molt M.D.   On: 05/12/2024 20:54   CT Angio Chest PE W and/or Wo Contrast Result Date: 05/12/2024 CLINICAL DATA:  High probability for PE. Abdominal and right flank pain. EXAM: CT ANGIOGRAPHY CHEST CT ABDOMEN AND PELVIS WITH CONTRAST TECHNIQUE: Multidetector CT imaging of the chest was performed using the standard protocol during bolus administration of intravenous contrast. Multiplanar CT image reconstructions and MIPs were obtained to evaluate the vascular anatomy. Multidetector CT imaging of the abdomen and pelvis was performed using the standard protocol during bolus administration of intravenous contrast. RADIATION DOSE REDUCTION: This exam was performed according to the departmental dose-optimization program which includes automated exposure control, adjustment of the mA and/or kV according to patient size and/or use of iterative reconstruction technique. CONTRAST:  80mL OMNIPAQUE  IOHEXOL  350 MG/ML SOLN COMPARISON:  CT renal stone 04/10/2024. CT angiogram chest 01/10/2024. FINDINGS: CTA CHEST FINDINGS Cardiovascular: Satisfactory opacification of the pulmonary arteries to the segmental level. No evidence of pulmonary embolism. Normal heart size. No pericardial effusion. Mediastinum/Nodes: No enlarged mediastinal, hilar, or axillary lymph nodes. Thyroid  gland, trachea, and esophagus demonstrate no significant findings. Lungs/Pleura: Left upper lobectomy changes are again seen. There stable pleural-parenchymal thickening in the upper left hemithorax. Moderate emphysema is again seen. There is bilateral peribronchial wall thickening diffusely. Focal airspace opacities in the posterior right upper lobe have minimally increased in size. There are scattered tree-in-bud opacities throughout the left lower lobe similar to prior. There is no pneumothorax. Trachea and central airways are patent. Pituitary anus and anal there  is a medial left upper lobe nodule measuring 5 mm image 12/65 which is unchanged. Musculoskeletal: Mild compression deformity of T8 appears chronic and unchanged. T11 and T12 mild compression deformities appear new from prior. Review of the MIP images confirms the above findings. CT ABDOMEN and PELVIS FINDINGS Hepatobiliary: The liver, gallbladder and bile ducts are within normal limits. Pancreas: Unremarkable. No pancreatic ductal dilatation or surrounding inflammatory changes. Spleen: Normal in size without focal abnormality. Adrenals/Urinary Tract: There is a punctate calculus in the lower pole the right kidney. Otherwise, kidneys, adrenal glands and bladder are within normal limits. Stomach/Bowel: Stomach is within normal limits. Appendix is not seen. No evidence of bowel wall thickening, distention, or inflammatory changes. Vascular/Lymphatic: Aortic atherosclerosis. No enlarged abdominal or pelvic lymph nodes. Reproductive: Status post hysterectomy. No adnexal masses. Other: No abdominal wall hernia or abnormality. No abdominopelvic ascites. Musculoskeletal: L2 compression deformity is unchanged. Vertebroplasty changes at L3 and L4 unchanged. There is new mild compression deformity of L5 when compared to 04/10/2024. There stable grade 1 anterolisthesis at L5-S1. Review of the MIP images confirms the above findings. IMPRESSION: 1. No evidence for pulmonary embolism. 2. Stable left upper lobectomy changes. 3. Stable 5 mm left upper lobe pulmonary nodule. 4. Stable tree-in-bud opacities in the left lower lobe, likely infectious/inflammatory. 5. New mild compression deformities of T11, T12 and L5. 6. Nonobstructing right renal calculus. Aortic Atherosclerosis (ICD10-I70.0) and Emphysema (ICD10-J43.9). Electronically Signed   By: Greig Maple HERO.D.  On: 05/12/2024 20:45   CT ABDOMEN PELVIS W CONTRAST Result Date: 05/12/2024 CLINICAL DATA:  High probability for PE. Abdominal and right flank pain. EXAM: CT  ANGIOGRAPHY CHEST CT ABDOMEN AND PELVIS WITH CONTRAST TECHNIQUE: Multidetector CT imaging of the chest was performed using the standard protocol during bolus administration of intravenous contrast. Multiplanar CT image reconstructions and MIPs were obtained to evaluate the vascular anatomy. Multidetector CT imaging of the abdomen and pelvis was performed using the standard protocol during bolus administration of intravenous contrast. RADIATION DOSE REDUCTION: This exam was performed according to the departmental dose-optimization program which includes automated exposure control, adjustment of the mA and/or kV according to patient size and/or use of iterative reconstruction technique. CONTRAST:  80mL OMNIPAQUE  IOHEXOL  350 MG/ML SOLN COMPARISON:  CT renal stone 04/10/2024. CT angiogram chest 01/10/2024. FINDINGS: CTA CHEST FINDINGS Cardiovascular: Satisfactory opacification of the pulmonary arteries to the segmental level. No evidence of pulmonary embolism. Normal heart size. No pericardial effusion. Mediastinum/Nodes: No enlarged mediastinal, hilar, or axillary lymph nodes. Thyroid  gland, trachea, and esophagus demonstrate no significant findings. Lungs/Pleura: Left upper lobectomy changes are again seen. There stable pleural-parenchymal thickening in the upper left hemithorax. Moderate emphysema is again seen. There is bilateral peribronchial wall thickening diffusely. Focal airspace opacities in the posterior right upper lobe have minimally increased in size. There are scattered tree-in-bud opacities throughout the left lower lobe similar to prior. There is no pneumothorax. Trachea and central airways are patent. Pituitary anus and anal there is a medial left upper lobe nodule measuring 5 mm image 12/65 which is unchanged. Musculoskeletal: Mild compression deformity of T8 appears chronic and unchanged. T11 and T12 mild compression deformities appear new from prior. Review of the MIP images confirms the above  findings. CT ABDOMEN and PELVIS FINDINGS Hepatobiliary: The liver, gallbladder and bile ducts are within normal limits. Pancreas: Unremarkable. No pancreatic ductal dilatation or surrounding inflammatory changes. Spleen: Normal in size without focal abnormality. Adrenals/Urinary Tract: There is a punctate calculus in the lower pole the right kidney. Otherwise, kidneys, adrenal glands and bladder are within normal limits. Stomach/Bowel: Stomach is within normal limits. Appendix is not seen. No evidence of bowel wall thickening, distention, or inflammatory changes. Vascular/Lymphatic: Aortic atherosclerosis. No enlarged abdominal or pelvic lymph nodes. Reproductive: Status post hysterectomy. No adnexal masses. Other: No abdominal wall hernia or abnormality. No abdominopelvic ascites. Musculoskeletal: L2 compression deformity is unchanged. Vertebroplasty changes at L3 and L4 unchanged. There is new mild compression deformity of L5 when compared to 04/10/2024. There stable grade 1 anterolisthesis at L5-S1. Review of the MIP images confirms the above findings. IMPRESSION: 1. No evidence for pulmonary embolism. 2. Stable left upper lobectomy changes. 3. Stable 5 mm left upper lobe pulmonary nodule. 4. Stable tree-in-bud opacities in the left lower lobe, likely infectious/inflammatory. 5. New mild compression deformities of T11, T12 and L5. 6. Nonobstructing right renal calculus. Aortic Atherosclerosis (ICD10-I70.0) and Emphysema (ICD10-J43.9). Electronically Signed   By: Greig Pique M.D.   On: 05/12/2024 20:45   Scheduled Meds:  budesonide -glycopyrrolate -formoterol   2 puff Inhalation BID   doxycycline   100 mg Oral Q12H   enoxaparin  (LOVENOX ) injection  40 mg Subcutaneous Q24H   feeding supplement  237 mL Oral TID BM   ipratropium-albuterol   3 mL Nebulization BID   levothyroxine   125 mcg Oral Q0600   metoprolol  succinate  12.5 mg Oral Daily   nicotine   14 mg Transdermal Daily   prednisoLONE  acetate  1 drop  Left Eye BID   rosuvastatin   20 mg Oral Daily   senna  1 tablet Oral BID   sertraline   25 mg Oral Daily   Continuous Infusions:  lactated ringers  40 mL/hr at 05/13/24 0943    LOS: 0 days   Alejandro Marker, DO [facility] Available via Epic secure chat 7am-7pm After these hours, please refer to coverage provider listed on [email or web address] 05/13/2024, 3:47 PM

## 2024-05-13 NOTE — Progress Notes (Signed)
   05/13/24 1416  PT Visit Information  Last PT Received On 05/13/24  Reason Eval/Treat Not Completed Patient not medically ready  History of Present Illness Tracy Park is a 68 y.o. female with medical history significant of COPD, chronic hypoxemic respiratory failure, adenocarcinoma of the lung status post left upper lobectomy, paroxysmal A-fib, hypothyroidism, anxiety, PTSD, who had recent hospitalization in 7/25 for an L3 compression fracture.   Pt lumbar and thoracic imaging, IR consult pending; will continue to follow for PT consult as appropriate.   Stann, PT Acute Rehabilitation Services Office: [phone] 05/13/2024

## 2024-05-13 NOTE — Progress Notes (Signed)
 OT Cancellation Note  Patient Details Name: Tracy Park MRN: [id number] DOB: 05/19/1956   Cancelled Treatment:    Reason Eval/Treat Not Completed: Medical issues which prohibited therapy. Pt is pending an IR consult.   Delanna JINNY Lesches, OTR/L 05/13/2024, 3:05 PM

## 2024-05-13 NOTE — Hospital Course (Addendum)
 Tracy Park is a 68 y.o. female with medical history significant of COPD, chronic hypoxemic respiratory failure, adenocarcinoma of the lung status post left upper lobectomy, paroxysmal A-fib, hypothyroidism, anxiety, PTSD, who had recent hospitalization in 7/25 for an L3 compression fracture.  She presents with right-sided back now and there is concern for new T8 compression fracture as well as L5 subacute fracture.  Obtaining MRI of the T-spine and L-spine and I have been consulted for further evaluation.  Palliative is also been consulted for further pain control and she was placed on the PCA pump. Further direction of care has been to be pending her MRI results however patient could not tolerate this due to pain and claustrophobia; due to multiple attempts this will be done under anesthesia on 05/16/2024 at [facility] patient will be transferred back to [facility].  Assessment and Plan:  Acute on Chronic Back Pain due to Compression Fractures: Presents with significant Back Pain. CT Scan done and showed Acute appearing T8 Compression Fx and subacute LF Compression Fx of L5 since prior examination of 04/10/2024 with approximately 20-30% loss of height and no retropulsion Noted to also have Progressive loss of height of the L1 vertebral body now 10-20% with no retropulsion and . Stable superior endplate fractures of T11, T12, L2, L3, and L4 status post vertebroplasty at L3 and L4. The CT Scans also mentioned stable grade 2 anterolisthesis L5-S1 with resultant severe left and moderate to severe right neuroforaminal narrowing with probable impingement of the exiting L5 nerve roots bilaterally. She continues to smoke. Admitted for Pain Control.  On gentle IV fluid hydration with LR at 40 mL/h for 1 day.  Continue with Acetaminophen  650 mg po q6hprn Mild Pain, Hydrocodone -Acetaminophen  1-2 po q6hprn Moderate Pain and Hydromorphone  0.5 mg q4hprn Severe Pain.  PT/OT are recommending SNF.  -IR consulted in the  morning and they are requesting an MRI T and L Spine to see if she will be a candidate for repeat kyphoplasty.  MRI has been attempted multiple times but unable to be done given her pain, inability lay flat and anxiety so now have requested it to be done under general anesthesia; Will  be done on 05/16/2024 at 8 AM at [facility] and she has been made NPO @ midnight -She is now on a Hydromorphone  PCA pump and pain is better controlled however still not able to do with PCA pump so we will need to hold this temporarily.   -Appreciate Palliative Care assistance and they added K Pad Aquathermia; Currently enrolled w/ [facility] and will continue Hospice Support after D/C.    COPD/Chronic Hypoxemic Hypercapnic Respiratory Failure / Adenocarcinoma of the Lung s/p Left Upper Lobe Lobectomy: Patient does report some productive cough, however denies any worsening.  Will continue patient's inhaler w/ Breztri  160-9-4.8 mcg/act IH 2 puff, Duoneb 3 mL BID and continue with as needed Albuterol  3 mL IH q6hprn Wheezing and SOB. Will also place on a short course of Doxycycline . Will consider Steroids pending Kyphoplasty   Leukocytosis: Mild and improving. WBC went from 15.8 -> 13.3 -> 11.1 -> 9.6. CTM and Trend and repeat CBC in the AM   Paroxysmal A-fib: CTM on Telemetry. Patient at this time is unsure if she takes Apixaban .  According to the previous documentation, she was continued on Eliquis  upon discharge to rehab.  Will hold Apixban for now in case she requires any procedure for her fractures.  C/w Metoprolol  Succinate 12.5 mg po daily  Hypothyroidism: Checked TSH and was 6.430. Free T4 was 1.05. Continue Levothyroxine  125 mcg po Daily and repeat TFTs in 4-6 weeks.   Hyperlipidemia: C/w Rosuvastatin  20 mg po Daily   Essential HTN: Hold Losartan  12.5 mg po daily; C/w Metoprolol   Succinate 12.5 mg po Daily. CTM BP per Protocol. Last BP reading was 131/86   Anxiety and Depression and PTSD: Continue w/  Sertraline  25 mg po Daily  Glaucoma: C/w Prednisolone  1% Ophthalmic    Moderate Protein Calorie Malnutrition: Dietitian consulted for further evaluation. C/w Ensure Plus High Protein 237. Nutrition Status: Nutrition Problem: Moderate Malnutrition Etiology: chronic illness Signs/Symptoms: moderate fat depletion, severe muscle depletion Interventions: Ensure Enlive (each supplement provides 350kcal and 20 grams of protein)  Tobacco Abuse: Smoking Cessation Counseling given. C/w Nicotine  14 mg TD q24h

## 2024-05-13 NOTE — Plan of Care (Signed)
 IR was requested for image guided KP.   Case was reviewed by Dr. Philip and Dr. Jennefer, patient may need 4 level KP for T8, T12, L1, and L5 but needs MR T and L spine for further evaluation.   Attending provider notified.   Will review MR when is done.  Please call IR for questions and concerns.   Saydee Zolman H Gardner Servantes PA-C 05/13/2024 9:58 AM

## 2024-05-13 NOTE — Care Management Obs Status (Signed)
 MEDICARE OBSERVATION STATUS NOTIFICATION   Patient Details  Name: EARLEE HERALD MRN: [id number] Date of Birth: 01-15-1956   Medicare Observation Status Notification Given:  Yes    Doneta Glenys DASEN, RN 05/13/2024, 1:44 PM

## 2024-05-13 NOTE — Consult Note (Signed)
 Consultation Note Date: 05/13/2024   Patient Name: Tracy Park  DOB: 1956-01-07  MRN: [id number]  Age / Sex: 68 y.o., female  PCP: Corlis Longs, FNP Referring Physician: Sherrill Alejandro Donovan, DO  Reason for Consultation: Establishing goals of care  HPI/Patient Profile: 68 y.o. female admitted on 05/12/2024    Tracy Park is a 68 y.o. female with medical history significant of COPD, chronic hypoxemic respiratory failure, adenocarcinoma of the lung status post left upper lobectomy, paroxysmal A-fib, hypothyroidism, anxiety, PTSD, who had recent hospitalization in 7/25 for an L3 compression fracture.  Patient had kyphoplasty and was subsequently able to discharge to rehab.  Clinical Assessment and Goals of Care:  Patient has been admitted to hospital medicine service with severe R sided back pain.   CT -  thoracic and lumbar spine shows acute appearing superior endplate fracture of T8.  There is also a new superior endplate fracture of L5 since prior examination, this fracture may be subacute in nature.  Progressive loss of height L1 vertebral body.  Stable superior endplate fractures of T11, T12, L2, L3, L4 status post vertebroplasty at L3 and L4.  Stable grade 2 anterolisthesis L5-S1 with resultant severe left and moderate to severe right neuroforaminal narrowing with probable impingement of the exiting L5 nerve roots bilaterally. New mild compression fracture deformities T11, T12, L5.    Palliative medicine is specialized medical care for people living with serious illness. It focuses on providing relief from the symptoms and stress of a serious illness. The goal is to improve quality of life for both the patient and the family. Goals of care: Broad aims of medical therapy in relation to the patient's values and preferences. Our aim is to provide medical care aimed at enabling patients to achieve the goals that  matter most to them, given the circumstances of their particular medical situation and their constraints.     NEXT OF KIN    SUMMARY OF RECOMMENDATIONS   Agree with DNR Agree with IR evaluation Agree with current pain and non-pain regimen. Consider PO scheduled Tylenol  as well as K pad Aquathermia.  No formally completed HCPOA documents, patient wishes for her daughter to be her HCPOA. Spiritual Care available for ACP documents completion if patient desires.  Was recently enrolled with [facility], as per patient, continue home hospice support after discharge.  Thank you for the consult.   Code Status/Advance Care Planning: DNR   Symptom Management:   IV PRN opioids Adjuvant  Palliative Prophylaxis:  Frequent Pain Assessment  Additional Recommendations (Limitations, Scope, Preferences): Full Scope Treatment  Psycho-social/Spiritual:  Desire for further Chaplaincy support:yes Additional Recommendations: Caregiving  Support/Resources  Prognosis:  Unable to determine  Discharge Planning: To Be Determined      Primary Diagnoses: Present on Admission:  Tobacco use  Chronic respiratory failure with hypoxia and hypercapnia (HCC)  Anxiety and depression  Essential hypertension  Paroxysmal atrial fibrillation (HCC)  COPD (chronic obstructive pulmonary disease) (HCC)   I have reviewed the medical record, interviewed the patient  and family, and examined the patient. The following aspects are pertinent.  Past Medical History:  Diagnosis Date   Adenocarcinoma of lung (HCC) 02/26/2022   Anginal pain (HCC)    Anxiety    Bipolar disorder (HCC)    Cancer (HCC)    COPD (chronic obstructive pulmonary disease) (HCC)    Dyspnea    Family history of adverse reaction to anesthesia    History of depression 08/03/2023   History of kidney stones    Hydroureteronephrosis 08/16/2021   Hypothyroidism    Lung cancer (HCC)    Malignant neoplasm of upper lobe of left lung (HCC)  11/12/2016   Myocardial infarction (HCC)    Paroxysmal atrial fibrillation (HCC)    Paroxysmal atrial fibrillation with RVR (HCC) 01/25/2023   PTSD (post-traumatic stress disorder)    Rib pain on left side 06/24/2023   Sleep apnea    Thyroid  disease    Social History   Socioeconomic History   Marital status: Divorced    Spouse name: Not on file   Number of children: Not on file   Years of education: Not on file   Highest education level: Not on file  Occupational History   Not on file  Tobacco Use   Smoking status: Every Day    Current packs/day: 0.15    Types: Cigarettes   Smokeless tobacco: Never  Vaping Use   Vaping status: Never Used  Substance and Sexual Activity   Alcohol  use: Not Currently   Drug use: Not Currently   Sexual activity: Not on file  Other Topics Concern   Not on file  Social History Narrative   Lives with daughter   Social Drivers of Health   Financial Resource Strain: High Risk (10/08/2023)   Received from [facility]   Overall Financial Resource Strain (CARDIA)    Difficulty of Paying Living Expenses: Very hard  Food Insecurity: No Food Insecurity (05/12/2024)   Hunger Vital Sign    Worried About Running Out of Food in the Last Year: Never true    Ran Out of Food in the Last Year: Never true  Recent Concern: Food Insecurity - Food Insecurity Present (03/03/2024)   Hunger Vital Sign    Worried About Running Out of Food in the Last Year: Sometimes true    Ran Out of Food in the Last Year: Sometimes true  Transportation Needs: No Transportation Needs (05/12/2024)   PRAPARE - Administrator, Civil Service (Medical): No    Lack of Transportation (Non-Medical): No  Physical Activity: Unknown (04/07/2023)   Received from [facility]   Exercise Vital Sign    On average, how many days per week do you engage in moderate to strenuous exercise (like a brisk walk)?: 0 days    Minutes of Exercise per Session: Not on file  Stress: Stress  Concern Present (04/07/2023)   Received from [facility] - Occupational Stress Questionnaire    Feeling of Stress : To some extent  Social Connections: Moderately Isolated (05/12/2024)   Social Connection and Isolation Panel    Frequency of Communication with Friends and Family: Three times a week    Frequency of Social Gatherings with Friends and Family: More than three times a week    Attends Religious Services: 1 to 4 times per year    Active Member of [employer] or Organizations: No    Attends Banker Meetings: Never    Marital Status: Divorced  Family History  Problem Relation Age of Onset   Hypertension Mother    Heart failure Mother    Hypertension Father    Diabetes Father    Heart failure Father    Scheduled Meds:  budesonide -glycopyrrolate -formoterol   2 puff Inhalation BID   doxycycline   100 mg Oral Q12H   enoxaparin  (LOVENOX ) injection  40 mg Subcutaneous Q24H   ipratropium-albuterol   3 mL Nebulization BID   levothyroxine   125 mcg Oral Q0600   metoprolol  succinate  12.5 mg Oral Daily   nicotine   14 mg Transdermal Daily   prednisoLONE  acetate  1 drop Left Eye BID   rosuvastatin   20 mg Oral Daily   senna  1 tablet Oral BID   sertraline   25 mg Oral Daily   Continuous Infusions:  lactated ringers  40 mL/hr at 05/13/24 0943   PRN Meds:.acetaminophen  **OR** acetaminophen , albuterol , benzonatate , HYDROcodone -acetaminophen , HYDROmorphone  (DILAUDID ) injection, ondansetron  **OR** ondansetron  (ZOFRAN ) IV, polyethylene glycol Medications Prior to Admission:  Prior to Admission medications   Medication Sig Start Date End Date Taking? Authorizing Provider  albuterol  (VENTOLIN  HFA) 108 (90 Base) MCG/ACT inhaler Inhale 2 puffs into the lungs every 6 (six) hours as needed for wheezing or shortness of breath. 03/08/24  Yes Jillian Buttery, MD  apixaban  (ELIQUIS ) 5 MG TABS tablet Take 1 tablet (5 mg total) by mouth 2 (two) times  daily. 06/06/23  Yes Barbarann Nest, MD  budesonide -glycopyrrolate -formoterol  (BREZTRI  AEROSPHERE) 160-9-4.8 MCG/ACT AERO inhaler Inhale 2 puffs into the lungs in the morning, at noon, and at bedtime. Patient taking differently: Inhale 2 puffs into the lungs in the morning and at bedtime. 03/08/24  Yes Jillian Buttery, MD  cyclobenzaprine  (FLEXERIL ) 10 MG tablet Take 1 tablet (10 mg total) by mouth 2 (two) times daily as needed for muscle spasms. 02/10/24  Yes Dreama Longs, MD  gabapentin  (NEURONTIN ) 300 MG capsule Take 1 capsule (300 mg total) by mouth 3 (three) times daily. 03/23/24  Yes Pokhrel, Laxman, MD  guaiFENesin  (MUCINEX ) 600 MG 12 hr tablet Take 1 tablet (600 mg total) by mouth 2 (two) times daily. Patient taking differently: Take 600 mg by mouth 2 (two) times daily as needed for cough or to loosen phlegm. 03/08/24 03/08/25 Yes Jillian Buttery, MD  ipratropium-albuterol  (DUONEB) 0.5-2.5 (3) MG/3ML SOLN Take 3 mLs by nebulization in the morning and at bedtime. 02/29/24  Yes [provider]  levothyroxine  (SYNTHROID ) 125 MCG tablet Take 125 mcg by mouth daily before breakfast.   Yes [provider]  losartan  (COZAAR ) 25 MG tablet Take 12.5 mg by mouth daily. 02/09/24  Yes [provider]  metoprolol  succinate (TOPROL -XL) 25 MG 24 hr tablet Take 12.5 mg by mouth daily.   Yes [provider]  Morphine  Sulfate (MORPHINE  CONCENTRATE) 10 mg / 0.5 ml concentrated solution Take 0.25 mLs (5 mg total) by mouth every 4 (four) hours as needed for severe pain (pain score 7-10). Patient taking differently: Take 2.5 mg by mouth every 4 (four) hours as needed for severe pain (pain score 7-10) or moderate pain (pain score 4-6). 03/23/24 03/23/25 Yes Pokhrel, Laxman, MD  nitroGLYCERIN  (NITROSTAT ) 0.4 MG SL tablet Place 0.4 mg under the tongue every 5 (five) minutes as needed for chest pain.   Yes [provider]  oxyCODONE  (ROXICODONE ) 5 MG immediate release tablet Take 1  tablet (5 mg total) by mouth every 4 (four) hours as needed for severe pain (pain score 7-10) or breakthrough pain. 03/23/24 03/23/25 Yes Pokhrel, Vernal, MD  OXYGEN  Inhale 2  L/min into the lungs continuous.   Yes [provider]  prednisoLONE  acetate (PRED FORTE ) 1 % ophthalmic suspension Place 1 drop into the left eye in the morning and at bedtime. 11/19/23  Yes [provider]  rosuvastatin  (CRESTOR ) 20 MG tablet Take 20 mg by mouth daily.   Yes [provider]  sertraline  (ZOLOFT ) 25 MG tablet Take 25 mg by mouth in the morning.   Yes [provider]  lidocaine  (LIDODERM ) 5 % Place 1 patch onto the skin daily. Remove & Discard patch within 12 hours or as directed by MD Patient not taking: Reported on 04/11/2024 02/10/24   Dreama Longs, MD  polyvinyl alcohol  (LIQUIFILM TEARS) 1.4 % ophthalmic solution Place 1 drop into the left eye in the morning, at noon, in the evening, and at bedtime.    [provider]  predniSONE  (DELTASONE ) 10 MG tablet Take 4 pills daily for 2 days then 3 pills daily for 3 days then 2 pills daily for 3 days then continue taking 1 pill daily Patient not taking: Reported on 03/10/2024 03/09/24   Jillian Buttery, MD  VOLTAREN ARTHRITIS PAIN 1 % GEL Apply 1 Application topically as needed (pain). Patient not taking: Reported on 05/12/2024 03/09/24   [provider]   Allergies  Allergen Reactions   Red Dye #40 (Allura Red) Hives, Itching and Other (See Comments)    Red food dye   Strawberry Extract Hives and Itching   Tomato Hives and Itching   Aspirin Hives   Tape Rash and Other (See Comments)    Prefers paper tape   Wound Dressing Adhesive Rash   Review of Systems +back pain  Physical Exam Awake alert Mild to moderate back pain Regular work of breathing S 1 S 2  Abdomen not tender No edema  Vital Signs: BP 129/76 (BP Location: Right Arm)   Pulse 84   Temp 97.7 F (36.5 C) (Oral)   Resp 18   Ht 5' 3 (1.6 m)    Wt 52.2 kg   SpO2 96%   BMI 20.39 kg/m  Pain Scale: 0-10   Pain Score: 8    SpO2: SpO2: 96 % O2 Device:SpO2: 96 % O2 Flow Rate: .O2 Flow Rate (L/min): 2 L/min  IO: Intake/output summary:  Intake/Output Summary (Last 24 hours) at 05/13/2024 1321 Last data filed at 05/13/2024 0943 Gross per 24 hour  Intake 588.31 ml  Output 450 ml  Net 138.31 ml    LBM: Last BM Date : 05/12/24 Baseline Weight: Weight: 52.2 kg Most recent weight: Weight: 52.2 kg     Palliative Assessment/Data:   PPS 50%  Time In:  1300 Time Out:  1400 Time Total:  60  Greater than 50%  of this time was spent counseling and coordinating care related to the above assessment and plan.  Signed by: Lonia Serve, MD   Please contact Palliative Medicine Team phone at [phone] for questions and concerns.  For individual provider: See Tracey

## 2024-05-14 ENCOUNTER — Inpatient Hospital Stay (HOSPITAL_COMMUNITY)

## 2024-05-14 DIAGNOSIS — S22060A Wedge compression fracture of T7-T8 vertebra, initial encounter for closed fracture: Secondary | ICD-10-CM | POA: Diagnosis not present

## 2024-05-14 DIAGNOSIS — Z515 Encounter for palliative care: Secondary | ICD-10-CM | POA: Diagnosis not present

## 2024-05-14 DIAGNOSIS — J449 Chronic obstructive pulmonary disease, unspecified: Secondary | ICD-10-CM | POA: Diagnosis present

## 2024-05-14 DIAGNOSIS — F419 Anxiety disorder, unspecified: Secondary | ICD-10-CM | POA: Diagnosis not present

## 2024-05-14 DIAGNOSIS — S22069A Unspecified fracture of T7-T8 vertebra, initial encounter for closed fracture: Secondary | ICD-10-CM | POA: Diagnosis not present

## 2024-05-14 DIAGNOSIS — F319 Bipolar disorder, unspecified: Secondary | ICD-10-CM | POA: Diagnosis present

## 2024-05-14 DIAGNOSIS — I252 Old myocardial infarction: Secondary | ICD-10-CM | POA: Diagnosis not present

## 2024-05-14 DIAGNOSIS — Z7901 Long term (current) use of anticoagulants: Secondary | ICD-10-CM | POA: Diagnosis not present

## 2024-05-14 DIAGNOSIS — E440 Moderate protein-calorie malnutrition: Secondary | ICD-10-CM | POA: Diagnosis present

## 2024-05-14 DIAGNOSIS — I10 Essential (primary) hypertension: Secondary | ICD-10-CM | POA: Diagnosis present

## 2024-05-14 DIAGNOSIS — F4310 Post-traumatic stress disorder, unspecified: Secondary | ICD-10-CM | POA: Diagnosis present

## 2024-05-14 DIAGNOSIS — Z886 Allergy status to analgesic agent status: Secondary | ICD-10-CM | POA: Diagnosis not present

## 2024-05-14 DIAGNOSIS — M8008XA Age-related osteoporosis with current pathological fracture, vertebra(e), initial encounter for fracture: Secondary | ICD-10-CM | POA: Diagnosis present

## 2024-05-14 DIAGNOSIS — S22060D Wedge compression fracture of T7-T8 vertebra, subsequent encounter for fracture with routine healing: Secondary | ICD-10-CM | POA: Diagnosis not present

## 2024-05-14 DIAGNOSIS — I480 Paroxysmal atrial fibrillation: Secondary | ICD-10-CM | POA: Diagnosis present

## 2024-05-14 DIAGNOSIS — Z833 Family history of diabetes mellitus: Secondary | ICD-10-CM | POA: Diagnosis not present

## 2024-05-14 DIAGNOSIS — Z9981 Dependence on supplemental oxygen: Secondary | ICD-10-CM | POA: Diagnosis not present

## 2024-05-14 DIAGNOSIS — R52 Pain, unspecified: Secondary | ICD-10-CM | POA: Diagnosis not present

## 2024-05-14 DIAGNOSIS — E785 Hyperlipidemia, unspecified: Secondary | ICD-10-CM | POA: Diagnosis present

## 2024-05-14 DIAGNOSIS — Z79899 Other long term (current) drug therapy: Secondary | ICD-10-CM | POA: Diagnosis not present

## 2024-05-14 DIAGNOSIS — F17210 Nicotine dependence, cigarettes, uncomplicated: Secondary | ICD-10-CM | POA: Diagnosis present

## 2024-05-14 DIAGNOSIS — Z720 Tobacco use: Secondary | ICD-10-CM | POA: Diagnosis not present

## 2024-05-14 DIAGNOSIS — Z79890 Hormone replacement therapy (postmenopausal): Secondary | ICD-10-CM | POA: Diagnosis not present

## 2024-05-14 DIAGNOSIS — Z66 Do not resuscitate: Secondary | ICD-10-CM | POA: Diagnosis present

## 2024-05-14 DIAGNOSIS — I2510 Atherosclerotic heart disease of native coronary artery without angina pectoris: Secondary | ICD-10-CM | POA: Diagnosis not present

## 2024-05-14 DIAGNOSIS — J9612 Chronic respiratory failure with hypercapnia: Secondary | ICD-10-CM | POA: Diagnosis present

## 2024-05-14 DIAGNOSIS — J9611 Chronic respiratory failure with hypoxia: Secondary | ICD-10-CM | POA: Diagnosis present

## 2024-05-14 DIAGNOSIS — Z85118 Personal history of other malignant neoplasm of bronchus and lung: Secondary | ICD-10-CM | POA: Diagnosis not present

## 2024-05-14 DIAGNOSIS — S32050A Wedge compression fracture of fifth lumbar vertebra, initial encounter for closed fracture: Secondary | ICD-10-CM | POA: Diagnosis not present

## 2024-05-14 DIAGNOSIS — Z902 Acquired absence of lung [part of]: Secondary | ICD-10-CM | POA: Diagnosis not present

## 2024-05-14 DIAGNOSIS — Z8249 Family history of ischemic heart disease and other diseases of the circulatory system: Secondary | ICD-10-CM | POA: Diagnosis not present

## 2024-05-14 DIAGNOSIS — S22060S Wedge compression fracture of T7-T8 vertebra, sequela: Secondary | ICD-10-CM | POA: Diagnosis not present

## 2024-05-14 DIAGNOSIS — M549 Dorsalgia, unspecified: Secondary | ICD-10-CM | POA: Diagnosis present

## 2024-05-14 DIAGNOSIS — M4854XA Collapsed vertebra, not elsewhere classified, thoracic region, initial encounter for fracture: Secondary | ICD-10-CM | POA: Diagnosis not present

## 2024-05-14 DIAGNOSIS — H409 Unspecified glaucoma: Secondary | ICD-10-CM | POA: Diagnosis present

## 2024-05-14 DIAGNOSIS — G8921 Chronic pain due to trauma: Secondary | ICD-10-CM | POA: Diagnosis present

## 2024-05-14 LAB — CBC WITH DIFFERENTIAL/PLATELET
Abs Immature Granulocytes: 0.06 K/uL (ref 0.00–0.07)
Basophils Absolute: 0 K/uL (ref 0.0–0.1)
Basophils Relative: 0 %
Eosinophils Absolute: 0.2 K/uL (ref 0.0–0.5)
Eosinophils Relative: 2 %
HCT: 40.9 % (ref 36.0–46.0)
Hemoglobin: 12.2 g/dL (ref 12.0–15.0)
Immature Granulocytes: 1 %
Lymphocytes Relative: 19 %
Lymphs Abs: 2.1 K/uL (ref 0.7–4.0)
MCH: 28.4 pg (ref 26.0–34.0)
MCHC: 29.8 g/dL — ABNORMAL LOW (ref 30.0–36.0)
MCV: 95.1 fL (ref 80.0–100.0)
Monocytes Absolute: 0.9 K/uL (ref 0.1–1.0)
Monocytes Relative: 8 %
Neutro Abs: 7.8 K/uL — ABNORMAL HIGH (ref 1.7–7.7)
Neutrophils Relative %: 70 %
Platelets: 207 K/uL (ref 150–400)
RBC: 4.3 MIL/uL (ref 3.87–5.11)
RDW: 14 % (ref 11.5–15.5)
WBC: 11.1 K/uL — ABNORMAL HIGH (ref 4.0–10.5)
nRBC: 0 % (ref 0.0–0.2)

## 2024-05-14 LAB — COMPREHENSIVE METABOLIC PANEL WITH GFR
ALT: 17 U/L (ref 0–44)
AST: 17 U/L (ref 15–41)
Albumin: 3.7 g/dL (ref 3.5–5.0)
Alkaline Phosphatase: 118 U/L (ref 38–126)
Anion gap: 12 (ref 5–15)
BUN: 16 mg/dL (ref 8–23)
CO2: 29 mmol/L (ref 22–32)
Calcium: 9.3 mg/dL (ref 8.9–10.3)
Chloride: 100 mmol/L (ref 98–111)
Creatinine, Ser: 0.44 mg/dL (ref 0.44–1.00)
GFR, Estimated: >60 mL/min (ref >60)
Glucose, Bld: 80 mg/dL (ref 70–99)
Potassium: 3.8 mmol/L (ref 3.5–5.1)
Sodium: 141 mmol/L (ref 135–145)
Total Bilirubin: 0.4 mg/dL (ref 0.0–1.2)
Total Protein: 6.3 g/dL — ABNORMAL LOW (ref 6.5–8.1)

## 2024-05-14 LAB — MAGNESIUM: Magnesium: 1.8 mg/dL (ref 1.7–2.4)

## 2024-05-14 LAB — PHOSPHORUS: Phosphorus: 3.4 mg/dL (ref 2.5–4.6)

## 2024-05-14 MED ORDER — ONDANSETRON HCL 4 MG/2ML IJ SOLN
4.0000 mg | Freq: Four times a day (QID) | INTRAMUSCULAR | Status: DC | PRN
  Administered 2024-05-15 – 2024-05-21 (×8): 4 mg via INTRAVENOUS
  Filled 2024-05-14 (×9): qty 2

## 2024-05-14 MED ORDER — HYDROMORPHONE HCL 1 MG/ML IJ SOLN
1.0000 mg | Freq: Once | INTRAMUSCULAR | Status: DC
  Filled 2024-05-14: qty 1

## 2024-05-14 MED ORDER — HYDROMORPHONE 1 MG/ML IV SOLN
INTRAVENOUS | Status: DC
  Administered 2024-05-14: 4 mg via INTRAVENOUS
  Administered 2024-05-14: 30 mg via INTRAVENOUS
  Administered 2024-05-16 (×3): 1 mL via INTRAVENOUS
  Administered 2024-05-17: 30 mg via INTRAVENOUS
  Administered 2024-05-18: 2 mg via INTRAVENOUS
  Administered 2024-05-18: 1 mg via INTRAVENOUS
  Filled 2024-05-14 (×2): qty 30

## 2024-05-14 MED ORDER — GUAIFENESIN ER 600 MG PO TB12
600.0000 mg | ORAL_TABLET | Freq: Two times a day (BID) | ORAL | Status: DC
  Administered 2024-05-14 – 2024-05-21 (×15): 600 mg via ORAL
  Filled 2024-05-14 (×15): qty 1

## 2024-05-14 MED ORDER — HYDROMORPHONE 1 MG/ML IV SOLN: INTRAVENOUS | Status: DC

## 2024-05-14 MED ORDER — PROCHLORPERAZINE EDISYLATE 10 MG/2ML IJ SOLN
10.0000 mg | Freq: Four times a day (QID) | INTRAMUSCULAR | Status: DC | PRN
  Administered 2024-05-14 – 2024-05-20 (×3): 10 mg via INTRAVENOUS
  Filled 2024-05-14 (×3): qty 2

## 2024-05-14 MED ORDER — SODIUM CHLORIDE 0.9% FLUSH: 9.0000 mL | INTRAVENOUS | Status: DC | PRN

## 2024-05-14 MED ORDER — MAGNESIUM SULFATE 2 GM/50ML IV SOLN
2.0000 g | Freq: Once | INTRAVENOUS | Status: AC
  Administered 2024-05-14: 2 g via INTRAVENOUS
  Filled 2024-05-14: qty 50

## 2024-05-14 MED ORDER — LORAZEPAM 2 MG/ML IJ SOLN: 1.0000 mg | Freq: Once | INTRAMUSCULAR | Status: DC

## 2024-05-14 MED ORDER — DIPHENHYDRAMINE HCL 12.5 MG/5ML PO ELIX: 12.5000 mg | ORAL_SOLUTION | Freq: Four times a day (QID) | ORAL | Status: DC | PRN

## 2024-05-14 MED ORDER — DIPHENHYDRAMINE HCL 50 MG/ML IJ SOLN
12.5000 mg | Freq: Four times a day (QID) | INTRAMUSCULAR | Status: DC | PRN
  Filled 2024-05-14: qty 1

## 2024-05-14 MED ORDER — LORAZEPAM 2 MG/ML IJ SOLN
1.0000 mg | Freq: Once | INTRAMUSCULAR | Status: AC | PRN
Start: 2024-05-14 — End: 2024-05-16
  Administered 2024-05-16: 1 mg via INTRAVENOUS
  Filled 2024-05-14: qty 1

## 2024-05-14 MED ORDER — NALOXONE HCL 0.4 MG/ML IJ SOLN: 0.4000 mg | INTRAMUSCULAR | Status: DC | PRN

## 2024-05-14 NOTE — Plan of Care (Signed)
  Problem: Education: Goal: Knowledge of General Education information will improve Description: Including pain rating scale, medication(s)/side effects and non-pharmacologic comfort measures Outcome: Progressing   Problem: Pain Managment: Goal: General experience of comfort will improve and/or be controlled Outcome: Progressing   Problem: Elimination: Goal: Will not experience complications related to bowel motility Outcome: Progressing Goal: Will not experience complications related to urinary retention Outcome: Progressing   Problem: Safety: Goal: Ability to remain free from injury will improve Outcome: Progressing

## 2024-05-14 NOTE — Evaluation (Addendum)
 Occupational Therapy Evaluation Patient Details Name: Tracy Park MRN: [id number] DOB: 1955/09/27 Today's Date: 05/14/2024   History of Present Illness   Tracy Park is a 68 y.o. female admitted with L5 and T8 compression fxs. Pt with medical history significant of COPD, chronic hypoxemic respiratory failure, adenocarcinoma of the lung status post left upper lobectomy, paroxysmal A-fib, hypothyroidism, anxiety, PTSD, who had recent hospitalization in 7/25 for an L3 compression fracture.     Clinical Impressions PTA pt lives at home with intermittent help of family. At baseline pt uses 2L and is modified independent @ rollator level however lately has required increased assistance with self care since recent DC from the hospital. Currently, Pt able to mobilize to EOB with min A and stand bedside for 10 min however was unable to ambulate due to back pain, which is worsened with coughing. Requires overall Max A with LB ADL tasks due to back pain and generalized weakness. Vomited several times during session - nsg aware. Feel Patient will benefit from continued inpatient follow up therapy, <3 hours/day to maximize functional level of independence with goal to DC back home. Acute OT to follow.      If plan is discharge home, recommend the following:   A lot of help with walking and/or transfers;A lot of help with bathing/dressing/bathroom     Functional Status Assessment   Patient has had a recent decline in their functional status and demonstrates the ability to make significant improvements in function in a reasonable and predictable amount of time.     Equipment Recommendations   None recommended by OT     Recommendations for Other Services         Precautions/Restrictions   Precautions Precautions: Back Recall of Precautions/Restrictions: Intact Precaution/Restrictions Comments: pt recalls back precautions from recent L3 fx; pt has lumbar corset from recent L3 fx;  pt denies falls in past 6 months Required Braces or Orthoses: Spinal Brace Spinal Brace: Lumbar corset Restrictions Weight Bearing Restrictions Per Provider Order: No     Mobility Bed Mobility Overal bed mobility: Needs Assistance Bed Mobility: Rolling, Sidelying to Sit, Sit to Supine Rolling: Min assist Sidelying to sit: HOB elevated, Min assist   Sit to supine: Mod assist   General bed mobility comments: assist to pivot hips to EOB    Transfers Overall transfer level: Needs assistance Equipment used: Rolling walker (2 wheels) Transfers: Sit to/from Stand Sit to Stand: Min assist           General transfer comment: pt stood at EOB for ~10 minutes with RW; pt had nausea and vomiting while standing, BP 136/77 in standing;      Balance Overall balance assessment: Needs assistance Sitting-balance support: Feet supported, No upper extremity supported Sitting balance-Leahy Scale: Good     Standing balance support: During functional activity, Bilateral upper extremity supported, Reliant on assistive device for balance Standing balance-Leahy Scale: Poor                             ADL either performed or assessed with clinical judgement   ADL Overall ADL's : Needs assistance/impaired Eating/Feeding: Independent Eating/Feeding Details (indicate cue type and reason): poor PO intake Grooming: Set up;Supervision/safety;Sitting   Upper Body Bathing: Minimal assistance;Sitting   Lower Body Bathing: Maximal assistance;Sit to/from stand   Upper Body Dressing : Minimal assistance   Lower Body Dressing: Maximal assistance;Sit to/from stand   Toilet Transfer: Minimal assistance;+2 for safety/equipment  Toileting- Clothing Manipulation and Hygiene: Moderate assistance       Functional mobility during ADLs: Minimal assistance;Rolling walker (2 wheels)       Vision Baseline Vision/History: 1 Wears glasses;2 Legally blind (blind R eye)       Perception          Praxis         Pertinent Vitals/Pain Pain Assessment Pain Assessment: Faces Faces Pain Scale: Hurts worst Pain Location: low back Pain Descriptors / Indicators: Crying, Grimacing, Guarding, Moaning Pain Intervention(s): Limited activity within patient's tolerance, PCA encouraged, Repositioned     Extremity/Trunk Assessment Upper Extremity Assessment Upper Extremity Assessment: Generalized weakness (shoulder issues, but functional)   Lower Extremity Assessment Lower Extremity Assessment: Defer to PT evaluation RLE Deficits / Details: low back pain with active knee extension RLE: Unable to fully assess due to pain RLE Sensation: WNL LLE Deficits / Details: knee ext -15* AROM, 4/5 strength LLE Sensation: WNL   Cervical / Trunk Assessment Cervical / Trunk Assessment: Kyphotic (back fxs)   Communication Communication Communication: No apparent difficulties   Cognition Arousal: Alert Behavior During Therapy: WFL for tasks assessed/performed (tearful) Cognition: History of cognitive impairments (hx of memory deficits)                               Following commands: Impaired Following commands impaired: Follows one step commands with increased time     Cueing  General Comments          Exercises     Shoulder Instructions      Home Living Family/patient expects to be discharged to:: Private residence Living Arrangements: Other relatives Available Help at Discharge: Family;Available PRN/intermittently;Friend(s) Type of Home: Mobile home Home Access: Stairs to enter Entrance Stairs-Number of Steps: 3-4 Entrance Stairs-Rails: Right;Left;Can reach both Home Layout: One level     Bathroom Shower/Tub: Chief Strategy Officer: Standard Bathroom Accessibility: Yes How Accessible: Accessible via walker Home Equipment: Cane - single point;Rollator (4 wheels);Shower seat;Grab bars - tub/shower;Hand held shower head;Adaptive  equipment Adaptive Equipment: Reacher Additional Comments: On 2 L O2 at home 24/7      Prior Functioning/Environment Prior Level of Function : Independent/Modified Independent             Mobility Comments: In home ambulates with rollator ADLs Comments: Ind with ADLs/selfcare, IADLs, home mgt, cooking, no longer drives; can go out in community but typically family does shopping; self care more difficult since DC home    OT Problem List: Decreased strength;Decreased range of motion;Decreased activity tolerance;Impaired balance (sitting and/or standing);Decreased safety awareness;Decreased knowledge of use of DME or AE;Decreased knowledge of precautions;Pain   OT Treatment/Interventions: Self-care/ADL training;Therapeutic exercise;DME and/or AE instruction;Therapeutic activities;Patient/family education;Balance training      OT Goals(Current goals can be found in the care plan section)   Acute Rehab OT Goals Patient Stated Goal: to not have so much pain OT Goal Formulation: With patient Time For Goal Achievement: 05/28/24 Potential to Achieve Goals: Good   OT Frequency:  Min 2X/week    Co-evaluation PT/OT/SLP Co-Evaluation/Treatment: Yes Reason for Co-Treatment: To address functional/ADL transfers;For patient/therapist safety PT goals addressed during session: Mobility/safety with mobility;Balance;Proper use of DME OT goals addressed during session: ADL's and self-care      AM-PAC OT 6 Clicks Daily Activity     Outcome Measure Help from another person eating meals?: None Help from another person taking care of personal grooming?: A Little Help  from another person toileting, which includes using toliet, bedpan, or urinal?: A Lot Help from another person bathing (including washing, rinsing, drying)?: A Lot Help from another person to put on and taking off regular upper body clothing?: A Little Help from another person to put on and taking off regular lower body  clothing?: A Lot 6 Click Score: 16   End of Session Equipment Utilized During Treatment: Gait belt;Rolling walker (2 wheels);Back brace Nurse Communication: Mobility status;Other (comment) (n/v)  Activity Tolerance: Patient limited by pain;Other (comment) (limited by n/v) Patient left: in bed;with call bell/phone within reach;with bed alarm set  OT Visit Diagnosis: Unsteadiness on feet (R26.81);Other abnormalities of gait and mobility (R26.89);Muscle weakness (generalized) (M62.81);Pain Pain - part of body:  (back)                Time: 8750-8674 OT Time Calculation (min): 36 min Charges:  OT General Charges $OT Visit: 1 Visit OT Evaluation $OT Eval Moderate Complexity: 1 Mod  Shree Espey, OT/L   Acute OT Clinical Specialist Acute Rehabilitation Services Pager [phone] Office [phone]   [facility] 05/14/2024, 2:23 PM

## 2024-05-14 NOTE — Progress Notes (Signed)
 PROGRESS NOTE    Tracy Park  FMW:[id number] DOB: 08-01-1956 DOA: 05/12/2024 PCP: Corlis Longs, FNP   Brief Narrative:  Tracy Park is a 68 y.o. female with medical history significant of COPD, chronic hypoxemic respiratory failure, adenocarcinoma of the lung status post left upper lobectomy, paroxysmal A-fib, hypothyroidism, anxiety, PTSD, who had recent hospitalization in 7/25 for an L3 compression fracture.  She presents with right-sided back now and there is concern for new T8 compression fracture as well as L5 subacute fracture.  Obtaining MRI of the T-spine and L-spine and I have been consulted for further evaluation.  Palliative is also been consulted for further pain control and she placed on the PCA pump however this is not compatible with MRI so we need to get her off of this for now.  Further direction of care has been to be pending her MRI results.  Assessment and Plan:  Acute on Chronic Back Pain due to Compression Fractures: Presents with significant Back Pain. CT Scan done and showed Acute appearing T8 Compression Fx and subacute LF Compression Fx of L5 since prior examination of 04/10/2024 with approximately 20-30% loss of height and no retropulsion Noted to also have Progressive loss of height of the L1 vertebral body now 10-20% with no retropulsion and . Stable superior endplate fractures of T11, T12, L2, L3, and L4 status post vertebroplasty at L3 and L4. The CT Scans also mentioned stable grade 2 anterolisthesis L5-S1 with resultant severe left and moderate to severe right neuroforaminal narrowing with probable impingement of the exiting L5 nerve roots bilaterally. She continues to smoke. Admitted for Pain Control.  On gentle IV fluid hydration with LR at 40 mL/h for 1 day.  Continue with Acetaminophen  650 mg po q6hprn Mild Pain, Hydrocodone -Acetaminophen  1-2 po q6hprn Moderate Pain and Hydromorphone  0.5 mg q4hprn Severe Pain.  PT/OT are recommending SNF. IR consulted in the  morning and they are requesting an MRI T and L Spine to see if she will be a candidate for repeat kyphoplasty.  MRI was attempted yesterday but unable to be done given her pain and inability lay flat and anxiety.  She is now on a Hydromorphone  PCA pump and pain is better controlled however still not able to do with PCA pump so we will need to hold this temporarily.  Consulted Palliative Care with a added K Pad Aquathermia; Currently enrolled w/ [facility] and will continue Hospice Support after D/C.    COPD/Chronic Hypoxemic Hypercapnic Respiratory Failure / Adenocarcinoma of the Lung s/p Left Upper Lobe Lobectomy: Patient does report some productive cough, however denies any worsening.  Will continue patient's inhaler w/ Breztri  160-9-4.8 mcg/act IH 2 puff, Duoneb 3 mL BID and continue with as needed Albuterol  3 mL IH q6hprn Wheezing and SOB. Will also place on a short course of Doxycycline . Will consider Steroids pending Kyphoplasty   Leukocytosis: Mild and improving. WBC went from 15.8 -> 13.3 -> 11.1. CTM and Trend and repeat CBC in the AM   Paroxysmal A-fib: CTM on Telemetry. Patient at this time is unsure if she takes Apixaban .  According to the previous documentation, she was continued on Eliquis  upon discharge to rehab.  Will hold Apixban for now in case she requires any procedure for her fractures.  C/w Metoprolol  Succinate 12.5 mg po daily    Hypothyroidism: Check TSH in the AM. Continue Levothyroxine  125 mcg po Daily .   Hyperlipidemia: C/w Rosuvastatin  20 mg po Daily   Essential HTN: Hold Losartan   12.5 mg po daily; C/w Metoprolol   Succinate 12.5 mg po Daily. CTM BP per Protocol. Last BP reading was 110/71   Anxiety and Depression and PTSD: Continue w/ Sertraline  25 mg po Daily  Glaucoma: C/w Prednisolone  1% Ophthalmic    Moderate Protein Calorie Malnutrition: Dietitian consulted for further evaluation. C/w Ensure Plus High Protein 237. Nutrition Status: Nutrition Problem: Moderate  Malnutrition Etiology: chronic illness Signs/Symptoms: moderate fat depletion, severe muscle depletion Interventions: Ensure Enlive (each supplement provides 350kcal and 20 grams of protein)  Tobacco Abuse: Smoking Cessation Counseling given. C/w Nicotine  14 mg TD q24h   DVT prophylaxis: enoxaparin  (LOVENOX ) injection 40 mg Start: 05/13/24 1000 Place TED hose Start: 05/12/24 2223    Code Status: Limited: Do not attempt resuscitation (DNR) -DNR-LIMITED -Do Not Intubate/DNI  Family Communication: No family currently at bedside  Disposition Plan:  Level of care: Telemetry Status is: Inpatient Remains inpatient appropriate because: Needs further clinical improvement and pain control and further evaluation with MRI   Consultants:  Interventional Radiology Palliative care medicine  Procedures:  As delineated as above  Antimicrobials:  Anti-infectives (From admission, onward)    Start     Dose/Rate Route Frequency Ordered Stop   05/12/24 2245  doxycycline  (VIBRA -TABS) tablet 100 mg        100 mg Oral Every 12 hours 05/12/24 2230 05/19/24 2159       Subjective: Seen and examined at bedside and she was having quite a bit of pain and states that she was not able to do her MRI given her anxiety and pain.  Unable to cough up her sputum today.  No nausea or vomiting.  States she has some abdominal discomfort from coughing.  Denied any lightheadedness or dizziness.  No other concerns or complaints this time  Objective: Vitals:   05/14/24 0827 05/14/24 1136 05/14/24 1223 05/14/24 1601  BP:   110/71   Pulse:   89   Resp:  16 16 14   Temp:   98.1 F (36.7 C)   TempSrc:      SpO2: 97% 96% 96% 96%  Weight:      Height:        Intake/Output Summary (Last 24 hours) at 05/14/2024 1744 Last data filed at 05/14/2024 0800 Gross per 24 hour  Intake 450.05 ml  Output --  Net 450.05 ml   Filed Weights   05/12/24 2300  Weight: 52.2 kg   Examination: Physical Exam:  Constitutional: Thin  older appearing than her stated age chronically ill-appearing Caucasian female who appears uncomfortable Respiratory: Diminished to auscultation bilaterally with some coarse breath sounds and has some rhonchi and expiratory wheezing but no appreciable crackles or rales.  Has a normal respiratory effort and is not tachypneic.  She is wearing supplemental oxygen  via nasal cannula Cardiovascular: RRR, no murmurs / rubs / gallops. S1 and S2 auscultated. No extremity edema.  Abdomen: Soft, non-tender, non-distended. Bowel sounds positive.  GU: Deferred. Musculoskeletal: No clubbing / cyanosis of digits/nails. No joint deformity upper and lower extremities. Skin: No rashes, lesions, ulcers on limited skin evaluation. No induration; Warm and dry.  Neurologic: CN 2-12 grossly intact with no focal deficits. Romberg sign and cerebellar reflexes not assessed.  Psychiatric: Normal judgment and insight. Alert and oriented x 3. Normal mood and appropriate affect.   Data Reviewed: I have personally reviewed following labs and imaging studies  CBC: Recent Labs  Lab 05/12/24 1712 05/13/24 0528 05/14/24 0448  WBC 15.8* 13.3* 11.1*  NEUTROABS  --   --  7.8*  HGB 12.4 12.3 12.2  HCT 41.2 40.6 40.9  MCV 94.1 95.8 95.1  PLT 245 207 207   Basic Metabolic Panel: Recent Labs  Lab 05/12/24 1712 05/13/24 0528 05/14/24 0448  NA 143 141 141  K 3.6 4.0 3.8  CL 103 101 100  CO2 27 28 29   GLUCOSE 110* 88 80  BUN 13 14 16   CREATININE 0.49 0.42* 0.44  CALCIUM  9.8 9.7 9.3  MG  --   --  1.8  PHOS  --   --  3.4   GFR: Estimated Creatinine Clearance: 57 mL/min (by C-G formula based on SCr of 0.44 mg/dL). Liver Function Tests: Recent Labs  Lab 05/14/24 0448  AST 17  ALT 17  ALKPHOS 118  BILITOT 0.4  PROT 6.3*  ALBUMIN 3.7   No results for input(s): LIPASE, AMYLASE in the last 168 hours. No results for input(s): AMMONIA in the last 168 hours. Coagulation Profile: Recent Labs  Lab  05/13/24 0528  INR 1.0   Cardiac Enzymes: No results for input(s): CKTOTAL, CKMB, CKMBINDEX, TROPONINI in the last 168 hours. BNP (last 3 results) No results for input(s): PROBNP in the last 8760 hours. HbA1C: No results for input(s): HGBA1C in the last 72 hours. CBG: No results for input(s): GLUCAP in the last 168 hours. Lipid Profile: No results for input(s): CHOL, HDL, LDLCALC, TRIG, CHOLHDL, LDLDIRECT in the last 72 hours. Thyroid  Function Tests: No results for input(s): TSH, T4TOTAL, FREET4, T3FREE, THYROIDAB in the last 72 hours. Anemia Panel: No results for input(s): VITAMINB12, FOLATE, FERRITIN, TIBC, IRON , RETICCTPCT in the last 72 hours. Sepsis Labs: No results for input(s): PROCALCITON, LATICACIDVEN in the last 168 hours.  No results found for this or any previous visit (from the past 240 hours).   Radiology Studies: DG CHEST PORT 1 VIEW Result Date: 05/14/2024 CLINICAL DATA:  [id number].  Shortness of breath. EXAM: PORTABLE CHEST 1 VIEW COMPARISON:  Portable chest 04/10/2024, CTA chest 05/12/2024. FINDINGS: 5:11 a.m. There are stable post treatment/postsurgical changes on the left with right upper lobectomy and scarring, volume loss and chronic right apical opacity. The lungs emphysematous and clear of infiltrates. The sulci are sharp. The mediastinum is stable with shift to the left. The cardiac size is normal. No new osseous finding. IMPRESSION: No evidence of acute chest disease. Stable post treatment/postsurgical changes on the left. Emphysema. Electronically Signed   By: Francis Quam M.D.   On: 05/14/2024 06:03   CT L-SPINE NO CHARGE Result Date: 05/12/2024 CLINICAL DATA:  Back pain EXAM: CT Thoracic and Lumbar spine with contrast TECHNIQUE: Multiplanar CT images of the thoracic and lumbar spine were reconstructed from contemporary CT of the Chest, Abdomen, and Pelvis. RADIATION DOSE REDUCTION: This exam was performed  according to the departmental dose-optimization program which includes automated exposure control, adjustment of the mA and/or kV according to patient size and/or use of iterative reconstruction technique. CONTRAST:  No additional contrast was administered for these reconstructions. COMPARISON:  CT lumbar spine 04/10/2024, CT chest 01/10/2024 FINDINGS: CT THORACIC SPINE FINDINGS Alignment: Normal. Vertebrae: There is an acute appearing superior endplate fracture of T8 with approximately 20-30% loss of height and no retropulsion. The posterior elements appear intact. There are superior endplate fractures of T11 and T12 with approximately 10-20% loss of height which appear chronic in nature. Remaining vertebral body height is preserved. Osseous structures are mildly osteopenic. Paraspinal and other soft tissues: There is mild paravertebral soft tissue swelling in keeping with edema or interstitial hemorrhage  anterior to T8 the paraspinal soft tissues are otherwise unremarkable. See accompanying report for CT examination of the chest, abdomen, and pelvis. Disc levels: There is mild endplate remodeling and vacuum disc phenomena at T10-11 and T12-L1 in keeping with changes of mild to moderate degenerative disc disease. No high-grade canal stenosis. No significant neuroforaminal narrowing. CT LUMBAR SPINE FINDINGS Segmentation: Transitional lumbar anatomy with a partially lumbarized S1. Consistent numbering system utilized based on the presence of a fully formed disc at L5-S1. Alignment: Grade 2 anterolisthesis L5-S1 approximately 8 mm, stable. Otherwise normal lumbar lordosis Vertebrae: Superior endplate fracture of L1 again seen with progressive sclerosis and loss of height of the L1 vertebral body now 10-20%. No retropulsion. Posterior elements are intact. Stable superior endplate fracture of L2. Stable superior endplate fractures of L3 and L4 status post vertebroplasty. New superior endplate fracture of L5 with  approximately 20-30% loss of height and no retropulsion. Posterior elements appear intact at this level. Paraspinal and other soft tissues: Lack of significant soft tissue swelling surrounding the L5 vertebral body suggests this fracture may be subacute in nature. No paraspinal fluid collection is seen. Refer to dictated report for CT examination of the chest, abdomen, pelvis for visceral findings. Disc levels: Vacuum disc phenomena L1-2 and disc space narrowing at L5-S1 is present in keeping with changes of moderate degenerative disc disease. Remaining disc heights are preserved. No high-grade canal stenosis. Disc height loss, un covered disc material, and anterolisthesis at L5-S1 result in severe left and moderate to severe right neuroforaminal narrowing with probable impingement of the exiting L5 nerve roots bilaterally. IMPRESSION: 1. Acute appearing superior endplate fracture of T8 with approximately 20-30% loss of height and no retropulsion. 2. New superior endplate fracture of L5 since prior examination of 04/10/2024 with approximately 20-30% loss of height and no retropulsion. Lack of significant soft tissue swelling surrounding the L5 vertebral body suggests this fracture may be subacute in nature. 3. Progressive loss of height of the L1 vertebral body now 10-20%. No retropulsion. 4. Stable superior endplate fractures of T11, T12, L2, L3, and L4 status post vertebroplasty at L3 and L4. 5. Stable grade 2 anterolisthesis L5-S1 with resultant severe left and moderate to severe right neuroforaminal narrowing with probable impingement of the exiting L5 nerve roots bilaterally. Electronically Signed   By: Dorethia Molt M.D.   On: 05/12/2024 20:54   CT T-SPINE NO CHARGE Result Date: 05/12/2024 CLINICAL DATA:  Back pain EXAM: CT Thoracic and Lumbar spine with contrast TECHNIQUE: Multiplanar CT images of the thoracic and lumbar spine were reconstructed from contemporary CT of the Chest, Abdomen, and Pelvis.  RADIATION DOSE REDUCTION: This exam was performed according to the departmental dose-optimization program which includes automated exposure control, adjustment of the mA and/or kV according to patient size and/or use of iterative reconstruction technique. CONTRAST:  No additional contrast was administered for these reconstructions. COMPARISON:  CT lumbar spine 04/10/2024, CT chest 01/10/2024 FINDINGS: CT THORACIC SPINE FINDINGS Alignment: Normal. Vertebrae: There is an acute appearing superior endplate fracture of T8 with approximately 20-30% loss of height and no retropulsion. The posterior elements appear intact. There are superior endplate fractures of T11 and T12 with approximately 10-20% loss of height which appear chronic in nature. Remaining vertebral body height is preserved. Osseous structures are mildly osteopenic. Paraspinal and other soft tissues: There is mild paravertebral soft tissue swelling in keeping with edema or interstitial hemorrhage anterior to T8 the paraspinal soft tissues are otherwise unremarkable. See accompanying report for CT  examination of the chest, abdomen, and pelvis. Disc levels: There is mild endplate remodeling and vacuum disc phenomena at T10-11 and T12-L1 in keeping with changes of mild to moderate degenerative disc disease. No high-grade canal stenosis. No significant neuroforaminal narrowing. CT LUMBAR SPINE FINDINGS Segmentation: Transitional lumbar anatomy with a partially lumbarized S1. Consistent numbering system utilized based on the presence of a fully formed disc at L5-S1. Alignment: Grade 2 anterolisthesis L5-S1 approximately 8 mm, stable. Otherwise normal lumbar lordosis Vertebrae: Superior endplate fracture of L1 again seen with progressive sclerosis and loss of height of the L1 vertebral body now 10-20%. No retropulsion. Posterior elements are intact. Stable superior endplate fracture of L2. Stable superior endplate fractures of L3 and L4 status post  vertebroplasty. New superior endplate fracture of L5 with approximately 20-30% loss of height and no retropulsion. Posterior elements appear intact at this level. Paraspinal and other soft tissues: Lack of significant soft tissue swelling surrounding the L5 vertebral body suggests this fracture may be subacute in nature. No paraspinal fluid collection is seen. Refer to dictated report for CT examination of the chest, abdomen, pelvis for visceral findings. Disc levels: Vacuum disc phenomena L1-2 and disc space narrowing at L5-S1 is present in keeping with changes of moderate degenerative disc disease. Remaining disc heights are preserved. No high-grade canal stenosis. Disc height loss, un covered disc material, and anterolisthesis at L5-S1 result in severe left and moderate to severe right neuroforaminal narrowing with probable impingement of the exiting L5 nerve roots bilaterally. IMPRESSION: 1. Acute appearing superior endplate fracture of T8 with approximately 20-30% loss of height and no retropulsion. 2. New superior endplate fracture of L5 since prior examination of 04/10/2024 with approximately 20-30% loss of height and no retropulsion. Lack of significant soft tissue swelling surrounding the L5 vertebral body suggests this fracture may be subacute in nature. 3. Progressive loss of height of the L1 vertebral body now 10-20%. No retropulsion. 4. Stable superior endplate fractures of T11, T12, L2, L3, and L4 status post vertebroplasty at L3 and L4. 5. Stable grade 2 anterolisthesis L5-S1 with resultant severe left and moderate to severe right neuroforaminal narrowing with probable impingement of the exiting L5 nerve roots bilaterally. Electronically Signed   By: Dorethia Molt M.D.   On: 05/12/2024 20:54   CT Angio Chest PE W and/or Wo Contrast Result Date: 05/12/2024 CLINICAL DATA:  High probability for PE. Abdominal and right flank pain. EXAM: CT ANGIOGRAPHY CHEST CT ABDOMEN AND PELVIS WITH CONTRAST  TECHNIQUE: Multidetector CT imaging of the chest was performed using the standard protocol during bolus administration of intravenous contrast. Multiplanar CT image reconstructions and MIPs were obtained to evaluate the vascular anatomy. Multidetector CT imaging of the abdomen and pelvis was performed using the standard protocol during bolus administration of intravenous contrast. RADIATION DOSE REDUCTION: This exam was performed according to the departmental dose-optimization program which includes automated exposure control, adjustment of the mA and/or kV according to patient size and/or use of iterative reconstruction technique. CONTRAST:  80mL OMNIPAQUE  IOHEXOL  350 MG/ML SOLN COMPARISON:  CT renal stone 04/10/2024. CT angiogram chest 01/10/2024. FINDINGS: CTA CHEST FINDINGS Cardiovascular: Satisfactory opacification of the pulmonary arteries to the segmental level. No evidence of pulmonary embolism. Normal heart size. No pericardial effusion. Mediastinum/Nodes: No enlarged mediastinal, hilar, or axillary lymph nodes. Thyroid  gland, trachea, and esophagus demonstrate no significant findings. Lungs/Pleura: Left upper lobectomy changes are again seen. There stable pleural-parenchymal thickening in the upper left hemithorax. Moderate emphysema is again seen. There is bilateral  peribronchial wall thickening diffusely. Focal airspace opacities in the posterior right upper lobe have minimally increased in size. There are scattered tree-in-bud opacities throughout the left lower lobe similar to prior. There is no pneumothorax. Trachea and central airways are patent. Pituitary anus and anal there is a medial left upper lobe nodule measuring 5 mm image 12/65 which is unchanged. Musculoskeletal: Mild compression deformity of T8 appears chronic and unchanged. T11 and T12 mild compression deformities appear new from prior. Review of the MIP images confirms the above findings. CT ABDOMEN and PELVIS FINDINGS Hepatobiliary: The  liver, gallbladder and bile ducts are within normal limits. Pancreas: Unremarkable. No pancreatic ductal dilatation or surrounding inflammatory changes. Spleen: Normal in size without focal abnormality. Adrenals/Urinary Tract: There is a punctate calculus in the lower pole the right kidney. Otherwise, kidneys, adrenal glands and bladder are within normal limits. Stomach/Bowel: Stomach is within normal limits. Appendix is not seen. No evidence of bowel wall thickening, distention, or inflammatory changes. Vascular/Lymphatic: Aortic atherosclerosis. No enlarged abdominal or pelvic lymph nodes. Reproductive: Status post hysterectomy. No adnexal masses. Other: No abdominal wall hernia or abnormality. No abdominopelvic ascites. Musculoskeletal: L2 compression deformity is unchanged. Vertebroplasty changes at L3 and L4 unchanged. There is new mild compression deformity of L5 when compared to 04/10/2024. There stable grade 1 anterolisthesis at L5-S1. Review of the MIP images confirms the above findings. IMPRESSION: 1. No evidence for pulmonary embolism. 2. Stable left upper lobectomy changes. 3. Stable 5 mm left upper lobe pulmonary nodule. 4. Stable tree-in-bud opacities in the left lower lobe, likely infectious/inflammatory. 5. New mild compression deformities of T11, T12 and L5. 6. Nonobstructing right renal calculus. Aortic Atherosclerosis (ICD10-I70.0) and Emphysema (ICD10-J43.9). Electronically Signed   By: Greig Pique M.D.   On: 05/12/2024 20:45   CT ABDOMEN PELVIS W CONTRAST Result Date: 05/12/2024 CLINICAL DATA:  High probability for PE. Abdominal and right flank pain. EXAM: CT ANGIOGRAPHY CHEST CT ABDOMEN AND PELVIS WITH CONTRAST TECHNIQUE: Multidetector CT imaging of the chest was performed using the standard protocol during bolus administration of intravenous contrast. Multiplanar CT image reconstructions and MIPs were obtained to evaluate the vascular anatomy. Multidetector CT imaging of the abdomen and  pelvis was performed using the standard protocol during bolus administration of intravenous contrast. RADIATION DOSE REDUCTION: This exam was performed according to the departmental dose-optimization program which includes automated exposure control, adjustment of the mA and/or kV according to patient size and/or use of iterative reconstruction technique. CONTRAST:  80mL OMNIPAQUE  IOHEXOL  350 MG/ML SOLN COMPARISON:  CT renal stone 04/10/2024. CT angiogram chest 01/10/2024. FINDINGS: CTA CHEST FINDINGS Cardiovascular: Satisfactory opacification of the pulmonary arteries to the segmental level. No evidence of pulmonary embolism. Normal heart size. No pericardial effusion. Mediastinum/Nodes: No enlarged mediastinal, hilar, or axillary lymph nodes. Thyroid  gland, trachea, and esophagus demonstrate no significant findings. Lungs/Pleura: Left upper lobectomy changes are again seen. There stable pleural-parenchymal thickening in the upper left hemithorax. Moderate emphysema is again seen. There is bilateral peribronchial wall thickening diffusely. Focal airspace opacities in the posterior right upper lobe have minimally increased in size. There are scattered tree-in-bud opacities throughout the left lower lobe similar to prior. There is no pneumothorax. Trachea and central airways are patent. Pituitary anus and anal there is a medial left upper lobe nodule measuring 5 mm image 12/65 which is unchanged. Musculoskeletal: Mild compression deformity of T8 appears chronic and unchanged. T11 and T12 mild compression deformities appear new from prior. Review of the MIP images confirms the above findings.  CT ABDOMEN and PELVIS FINDINGS Hepatobiliary: The liver, gallbladder and bile ducts are within normal limits. Pancreas: Unremarkable. No pancreatic ductal dilatation or surrounding inflammatory changes. Spleen: Normal in size without focal abnormality. Adrenals/Urinary Tract: There is a punctate calculus in the lower pole the  right kidney. Otherwise, kidneys, adrenal glands and bladder are within normal limits. Stomach/Bowel: Stomach is within normal limits. Appendix is not seen. No evidence of bowel wall thickening, distention, or inflammatory changes. Vascular/Lymphatic: Aortic atherosclerosis. No enlarged abdominal or pelvic lymph nodes. Reproductive: Status post hysterectomy. No adnexal masses. Other: No abdominal wall hernia or abnormality. No abdominopelvic ascites. Musculoskeletal: L2 compression deformity is unchanged. Vertebroplasty changes at L3 and L4 unchanged. There is new mild compression deformity of L5 when compared to 04/10/2024. There stable grade 1 anterolisthesis at L5-S1. Review of the MIP images confirms the above findings. IMPRESSION: 1. No evidence for pulmonary embolism. 2. Stable left upper lobectomy changes. 3. Stable 5 mm left upper lobe pulmonary nodule. 4. Stable tree-in-bud opacities in the left lower lobe, likely infectious/inflammatory. 5. New mild compression deformities of T11, T12 and L5. 6. Nonobstructing right renal calculus. Aortic Atherosclerosis (ICD10-I70.0) and Emphysema (ICD10-J43.9). Electronically Signed   By: Greig Pique M.D.   On: 05/12/2024 20:45   Scheduled Meds:  budesonide -glycopyrrolate -formoterol   2 puff Inhalation BID   doxycycline   100 mg Oral Q12H   enoxaparin  (LOVENOX ) injection  40 mg Subcutaneous Q24H   feeding supplement  237 mL Oral TID BM   guaiFENesin   600 mg Oral BID   HYDROmorphone    Intravenous Q4H   ipratropium-albuterol   3 mL Nebulization BID   levothyroxine   125 mcg Oral Q0600   metoprolol  succinate  12.5 mg Oral Daily   nicotine   14 mg Transdermal Daily   prednisoLONE  acetate  1 drop Left Eye BID   rosuvastatin   20 mg Oral Daily   senna  1 tablet Oral BID   sertraline   25 mg Oral Daily   Continuous Infusions:   LOS: 1 day   Alejandro Marker, DO [facility] Available via Epic secure chat 7am-7pm After these hours, please refer to coverage  provider listed on [email or web address] 05/14/2024, 5:44 PM

## 2024-05-14 NOTE — Evaluation (Signed)
 Physical Therapy Evaluation Patient Details Name: Tracy Park MRN: [id number] DOB: 01/05/1956 Today's Date: 05/14/2024  History of Present Illness  Tracy Park is a 68 y.o. female admitted with L5 and T8 compression fxs. Pt with medical history significant of COPD, chronic hypoxemic respiratory failure, adenocarcinoma of the lung status post left upper lobectomy, paroxysmal A-fib, hypothyroidism, anxiety, PTSD, who had recent hospitalization in 7/25 for an L3 compression fracture.  Clinical Impression  Pt admitted with above diagnosis. Min assist for bed mobility. Reviewed log roll technique and back precautions. Pt stood at edge of bed for ~10 minutes with RW, she had nausea and vomiting while standing. BP 136/77 in standing. Pt reports severe pain at all times, she used the PCA during PT/OT session. Pt puts forth good effort. Patient will benefit from continued inpatient follow up therapy, <3 hours/day.  Pt currently with functional limitations due to the deficits listed below (see PT Problem List). Pt will benefit from acute skilled PT to increase their independence and safety with mobility to allow discharge.           If plan is discharge home, recommend the following: A little help with walking and/or transfers;A little help with bathing/dressing/bathroom;Assistance with cooking/housework;Assist for transportation;Help with stairs or ramp for entrance   Can travel by private vehicle   Yes    Equipment Recommendations None recommended by PT  Recommendations for Other Services       Functional Status Assessment Patient has had a recent decline in their functional status and demonstrates the ability to make significant improvements in function in a reasonable and predictable amount of time.     Precautions / Restrictions Precautions Precautions: Back Recall of Precautions/Restrictions: Intact Precaution/Restrictions Comments: pt recalls back precautions from recent L3 fx; pt  has lumbar corset from recent L3 fx; pt denies falls in past 6 months Required Braces or Orthoses: Spinal Brace Spinal Brace: Lumbar corset Restrictions Weight Bearing Restrictions Per Provider Order: No      Mobility  Bed Mobility Overal bed mobility: Needs Assistance Bed Mobility: Rolling, Sidelying to Sit Rolling: Min assist Sidelying to sit: HOB elevated, Min assist       General bed mobility comments: assist to pivot hips to EOB    Transfers Overall transfer level: Needs assistance Equipment used: Rolling walker (2 wheels) Transfers: Sit to/from Stand Sit to Stand: Contact guard assist           General transfer comment: pt stood at EOB for ~10 minutes with RW; pt had nausea and vomiting while standing, BP 136/77 in standing;    Ambulation/Gait Ambulation/Gait assistance: Contact guard assist Gait Distance (Feet): 1 Feet Assistive device: Rolling walker (2 wheels) Gait Pattern/deviations: Step-to pattern       General Gait Details: pt took 2 side steps at edge of bed with RW  Stairs            Wheelchair Mobility     Tilt Bed    Modified Rankin (Stroke Patients Only)       Balance Overall balance assessment: Needs assistance Sitting-balance support: Feet supported, No upper extremity supported Sitting balance-Leahy Scale: Good     Standing balance support: During functional activity, Bilateral upper extremity supported, Reliant on assistive device for balance Standing balance-Leahy Scale: Fair                               Pertinent Vitals/Pain Pain Assessment Pain Assessment: Faces  Faces Pain Scale: Hurts worst Pain Location: low back Pain Descriptors / Indicators: Crying, Grimacing, Guarding, Moaning Pain Intervention(s): Limited activity within patient's tolerance, Monitored during session, PCA encouraged, Repositioned    Home Living Family/patient expects to be discharged to:: Private residence Living Arrangements:  Other relatives Available Help at Discharge: Family;Available PRN/intermittently;Friend(s) Type of Home: Mobile home Home Access: Stairs to enter Entrance Stairs-Rails: Right;Left;Can reach both Entrance Stairs-Number of Steps: 3-4   Home Layout: One level Home Equipment: Cane - single point;Rollator (4 wheels);Shower seat;Grab bars - tub/shower;Hand held shower head Additional Comments: On 2 L O2 at home 24/7    Prior Function Prior Level of Function : Independent/Modified Independent             Mobility Comments: In home ambulates with rollator ADLs Comments: Ind with ADLs/selfcare, IADLs, home mgt, cooking, no longer drives; can go out in community but typically family does shopping     Extremity/Trunk Assessment   Upper Extremity Assessment Upper Extremity Assessment: Defer to OT evaluation    Lower Extremity Assessment Lower Extremity Assessment: Generalized weakness;RLE deficits/detail;LLE deficits/detail RLE Deficits / Details: low back pain with active knee extension RLE: Unable to fully assess due to pain RLE Sensation: WNL LLE Deficits / Details: knee ext -15* AROM, 4/5 strength LLE Sensation: WNL    Cervical / Trunk Assessment Cervical / Trunk Assessment: Kyphotic  Communication   Communication Communication: No apparent difficulties    Cognition Arousal: Alert Behavior During Therapy: WFL for tasks assessed/performed   PT - Cognitive impairments: No apparent impairments                         Following commands: Impaired Following commands impaired: Follows one step commands with increased time     Cueing       General Comments      Exercises     Assessment/Plan    PT Assessment Patient needs continued PT services  PT Problem List Decreased strength;Decreased range of motion;Decreased activity tolerance;Pain;Decreased mobility       PT Treatment Interventions      PT Goals (Current goals can be found in the Care Plan  section)  Acute Rehab PT Goals Patient Stated Goal: decrease pain, go home PT Goal Formulation: With patient Time For Goal Achievement: 05/28/24 Potential to Achieve Goals: Fair    Frequency Min 3X/week     Co-evaluation PT/OT/SLP Co-Evaluation/Treatment: Yes Reason for Co-Treatment: To address functional/ADL transfers PT goals addressed during session: Mobility/safety with mobility;Balance;Proper use of DME         AM-PAC PT 6 Clicks Mobility  Outcome Measure Help needed turning from your back to your side while in a flat bed without using bedrails?: A Little Help needed moving from lying on your back to sitting on the side of a flat bed without using bedrails?: A Little Help needed moving to and from a bed to a chair (including a wheelchair)?: A Little Help needed standing up from a chair using your arms (e.g., wheelchair or bedside chair)?: A Little Help needed to walk in hospital room?: A Little Help needed climbing 3-5 steps with a railing? : A Lot 6 Click Score: 17    End of Session Equipment Utilized During Treatment: Gait belt;Oxygen  Activity Tolerance: Patient limited by pain Patient left: in bed;with call bell/phone within reach Nurse Communication: Mobility status;Other (comment) (pt nauseous) PT Visit Diagnosis: Difficulty in walking, not elsewhere classified (R26.2);Pain    Time: 8750-8673 PT Time Calculation (  min) (ACUTE ONLY): 37 min   Charges:   PT Evaluation $PT Eval Moderate Complexity: 1 Mod PT Treatments $Therapeutic Activity: 8-22 mins PT General Charges $$ ACUTE PT VISIT: 1 Visit         Sylvan Delon Copp PT 05/14/2024  Acute Rehabilitation Services  Office [phone]

## 2024-05-14 NOTE — Progress Notes (Signed)
 Daily Progress Note   Patient Name: Tracy Park       Date: 05/14/2024 DOB: Jan 19, 1956  Age: 68 y.o. MRN#: [id number] Attending Physician: Sherrill Alejandro Donovan, DO Primary Care Physician: Corlis Longs, FNP Admit Date: 05/12/2024  Reason for Consultation/Follow-up: Establishing goals of care and Pain control  Subjective:  Awake alert, complains of back pain, in mild distress, discussed with [facility] physician, see below.   Length of Stay: 1  Current Medications: Scheduled Meds:   budesonide -glycopyrrolate -formoterol   2 puff Inhalation BID   doxycycline   100 mg Oral Q12H   enoxaparin  (LOVENOX ) injection  40 mg Subcutaneous Q24H   feeding supplement  237 mL Oral TID BM   guaiFENesin   600 mg Oral BID   HYDROmorphone    Intravenous Q4H   ipratropium-albuterol   3 mL Nebulization BID   levothyroxine   125 mcg Oral Q0600   metoprolol  succinate  12.5 mg Oral Daily   nicotine   14 mg Transdermal Daily   prednisoLONE  acetate  1 drop Left Eye BID   rosuvastatin   20 mg Oral Daily   senna  1 tablet Oral BID   sertraline   25 mg Oral Daily    Continuous Infusions:   PRN Meds: acetaminophen  **OR** acetaminophen , albuterol , benzonatate , diphenhydrAMINE  **OR** diphenhydrAMINE , HYDROmorphone  (DILAUDID ) injection, naloxone  **AND** sodium chloride  flush, ondansetron  (ZOFRAN ) IV, ondansetron  **OR** [DISCONTINUED] ondansetron  (ZOFRAN ) IV, polyethylene glycol  Physical Exam         Awake alert Back pain Resting in bed Regular work of breathing No edema  Vital Signs: BP 123/78 (BP Location: Right Arm)   Pulse 82   Temp 97.6 F (36.4 C)   Resp 16   Ht 5' 3 (1.6 m)   Wt 52.2 kg   SpO2 97%   BMI 20.39 kg/m  SpO2: SpO2: 97 % O2 Device: O2 Device: Nasal Cannula O2 Flow Rate: O2 Flow Rate (L/min):  2 L/min  Intake/output summary:  Intake/Output Summary (Last 24 hours) at 05/14/2024 1103 Last data filed at 05/14/2024 0800 Gross per 24 hour  Intake 570.05 ml  Output --  Net 570.05 ml   LBM: Last BM Date : 05/12/24 Baseline Weight: Weight: 52.2 kg Most recent weight: Weight: 52.2 kg       Palliative Assessment/Data:      Patient Active Problem List   Diagnosis Date Noted  Compression fracture of T8 vertebra (HCC) 05/12/2024   Closed compression fracture of L3 vertebra (HCC) 03/10/2024   Compression fracture of lumbar vertebra (HCC) 03/10/2024   Lumbar compression fracture (HCC) 03/10/2024   Hypokalemia 02/06/2024   Factitious disorder with predominantly psychological signs and symptoms 01/30/2024   Palliative care encounter 01/30/2024   Need for emotional support 01/30/2024   Counseling and coordination of care 01/30/2024   Goals of care, counseling/discussion 01/30/2024   COPD exacerbation (HCC) 01/30/2024   Acute on chronic respiratory failure with hypoxia and hypercapnia (HCC) 12/13/2023   COPD with acute exacerbation 09/22/2023   Paroxysmal atrial fibrillation (HCC) 02/23/2023   Pulmonary nodule 01/25/2023   COPD (chronic obstructive pulmonary disease) (HCC) 01/25/2023   Essential hypertension 01/25/2023   DDD (degenerative disc disease), cervical 03/24/2022   Bipolar disorder (HCC) 03/24/2022   Dyslipidemia 03/13/2022   Anxiety and depression 03/13/2022   History of lung cancer 03/13/2022   Chronic respiratory failure with hypoxia and hypercapnia (HCC) 02/26/2022   Normocytic anemia 02/08/2022   Malnutrition of moderate degree 08/25/2021   Coronary artery disease involving native coronary artery of native heart without angina pectoris 08/18/2018   Chronic bilateral low back pain without sciatica 08/21/2016   Obstructive sleep apnea (adult) (pediatric) 02/22/2015   Hyperlipidemia 06/03/2013   Vitamin D  deficiency 06/03/2013   Hypothyroidism 04/28/2012    Tobacco use 04/28/2012    Palliative Care Assessment & Plan   Patient Profile:    Assessment:  Tracy Park is a 68 y.o. female with medical history significant of COPD, chronic hypoxemic respiratory failure, adenocarcinoma of the lung status post left upper lobectomy, paroxysmal A-fib, hypothyroidism, anxiety, PTSD, who had recent hospitalization in 7/25 for an L3 compression fracture.  She presented with right-sided back now and there is concern for new T8 compression fracture as well as L5 subacute fracture.    MRI of the T-spine and L-spine has been requested for further evaluation.  Palliative  consulted for further pain control.   Recommendations/Plan:  Dilaudid  bolus only PCA Agree with MRI L and T spine Agree with current bowel and anti emetic regimen. Miralax  Senokot and Zofran .  PMT to follow.    Code Status:    Code Status Orders  (From admission, onward)           Start     Ordered   05/12/24 2223  Do not attempt resuscitation (DNR)- Limited -Do Not Intubate (DNI)  Continuous       Question Answer Comment  If pulseless and not breathing No CPR or chest compressions.   In Pre-Arrest Conditions (Patient Is Breathing and Has A Pulse) Do not intubate. Provide all appropriate non-invasive medical interventions. Avoid ICU transfer unless indicated or required.   Consent: Discussion documented in EHR or advanced directives reviewed      05/12/24 2230           Code Status History     Date Active Date Inactive Code Status Order ID Comments User Context   03/16/2024 2324 03/23/2024 1723 Limited: Do not attempt resuscitation (DNR) -DNR-LIMITED -Do Not Intubate/DNI  [id number]  [name] POUR, NP Inpatient   03/10/2024 0921 03/16/2024 2324 Full Code [id number]  [name], MD ED   03/10/2024 0734 03/10/2024 0921 Full Code [id number]  [name], MD ED   03/10/2024 0153 03/10/2024 0734 Limited: Do not attempt resuscitation (DNR) -DNR-LIMITED -Do Not  Intubate/DNI  [id number]  [name], DO ED   03/02/2024 2338 03/09/2024  0012 Limited: Do not attempt resuscitation (DNR) -DNR-LIMITED -Do Not Intubate/DNI  [id number]  [name], MD ED   02/06/2024 0102 02/09/2024 2157 Limited: Do not attempt resuscitation (DNR) -DNR-LIMITED -Do Not Intubate/DNI  [id number]  [name], MD Inpatient   01/29/2024 2130 01/31/2024 1746 Limited: Do not attempt resuscitation (DNR) -DNR-LIMITED -Do Not Intubate/DNI  [id number]  [name], MD ED   01/13/2024 2251 01/23/2024 1550 Limited: Do not attempt resuscitation (DNR) -DNR-LIMITED -Do Not Intubate/DNI  [id number]  [name], MD ED   01/02/2024 1950 01/07/2024 1833 Limited: Do not attempt resuscitation (DNR) -DNR-LIMITED -Do Not Intubate/DNI  [id number]  [name], MD Inpatient   12/28/2023 2137 12/30/2023 1838 Limited: Do not attempt resuscitation (DNR) -DNR-LIMITED -Do Not Intubate/DNI  [id number]  [name], MD ED   12/13/2023 0434 12/19/2023 1705 Limited: Do not attempt resuscitation (DNR) -DNR-LIMITED -Do Not Intubate/DNI  [id number]  [name], MD ED   11/01/2023 1624 11/07/2023 1849 Limited: Do not attempt resuscitation (DNR) -DNR-LIMITED -Do Not Intubate/DNI  [id number]  [name], MD ED   09/22/2023 2146 09/28/2023 2207 Limited: Do not attempt resuscitation (DNR) -DNR-LIMITED -Do Not Intubate/DNI  [id number]  [name], MD ED   08/04/2023 0227 08/09/2023 1843 Limited: Do not attempt resuscitation (DNR) -DNR-LIMITED -Do Not Intubate/DNI  [id number]  [name], MD Inpatient   08/03/2023 2204 08/04/2023 0227 Full Code [id number]  [name], MD ED   07/24/2023 2244 07/27/2023 2024 Limited: Do not attempt resuscitation (DNR) -DNR-LIMITED -Do Not Intubate/DNI  [id number]  [name], MD ED   06/25/2023 0003 06/27/2023 2009 Limited: Do not attempt resuscitation (DNR) -DNR-LIMITED -Do Not Intubate/DNI  [id number]  [name], MD ED   06/19/2023 1739 06/22/2023 2128  Do not attempt resuscitation (DNR) PRE-ARREST INTERVENTIONS DESIRED [id number]  Lawence Madison LABOR, MD ED   06/04/2023 1509 06/06/2023 1814 Do not attempt resuscitation (DNR) PRE-ARREST INTERVENTIONS DESIRED [id number]  [name], MD ED   04/28/2023 0741 05/01/2023 1830 DNR [id number]  [name], MD ED   04/17/2023 0151 04/18/2023 1944 DNR [id number]  [name], DO Inpatient   04/11/2023 2132 04/14/2023 0035 Full Code [id number]  Hugelmeyer, Thersia, DO ED   03/26/2023 0938 03/27/2023 1838 DNR [id number]  [name], MD Inpatient   03/06/2023 2249 03/10/2023 2107 DNR [id number]  [name], DO ED   02/23/2023 2046 02/26/2023 2245 DNR [id number]  [name], MD ED   01/25/2023 2350 01/28/2023 1928 DNR [id number]  [name], DO Inpatient   01/08/2023 0235 01/13/2023 1928 DNR [id number]  [name], DO Inpatient   01/08/2023 0117 01/08/2023 0235 Full Code [id number]  [name], DO ED   03/24/2022 1159 03/28/2022 1546 Full Code [id number]  [name], MD ED   03/14/2022 2132 03/16/2022 1602 Full Code [id number]  [name], MD Inpatient   03/14/2022 0331 03/14/2022 2132 DNR [id number]  [name], MD ED   03/13/2022 2149 03/14/2022 0331 DNR [id number]  Lawence, Madison LABOR, MD ED   02/26/2022 0200 03/01/2022 1555 DNR [id number]  [name], DO ED   08/16/2021 2316 08/25/2021 2324 DNR [id number]  [name], MD ED   08/16/2021 2253 08/16/2021 2316 Full Code [id number]  [name], MD ED   05/03/2021 0052 05/05/2021 1756 Full Code [id number]  [name], MD ED   04/10/2020 0342 04/12/2020 2137 Full Code [id number]  [name], MD ED   04/28/2012 0926 04/28/2012 2047 Full Code [id number]  [name], RN Inpatient       Prognosis:  Unable to determine  Discharge Planning: To Be Determined  Care plan was discussed with patient and [facility] colleague.   Thank you for allowing the Palliative Medicine Team to assist in the care of this patient. High MDM      Greater than 50%  of this time was spent counseling and coordinating care related to the above assessment and plan.  Lonia Serve, MD  Please contact Palliative Medicine Team phone at [phone] for questions and concerns.

## 2024-05-14 NOTE — Plan of Care (Signed)

## 2024-05-14 NOTE — TOC Progression Note (Signed)
 Transition of Care [facility]) - Progression Note    Patient Details  Name: Tracy Park MRN: [id number] Date of Birth: 09-10-1955  Transition of [facility]) CM/SW Contact  Doneta Glenys DASEN, RN Phone Number: 05/14/2024, 5:00 PM  Clinical Narrative:     [name] [phone] has requested that an order be entered and to be called if patient is discharged.   Expected Discharge Plan: Home w Home Health Services Barriers to Discharge: Continued Medical Work up               Expected Discharge Plan and Services In-house Referral: NA Discharge Planning Services: CM Consult Post Acute Care Choice: NA Living arrangements for the past 2 months: Mobile Home                 DME Arranged: N/A DME Agency: NA       HH Arranged: NA HH Agency: NA         Social Drivers of Health (SDOH) Interventions SDOH Screenings   Food Insecurity: No Food Insecurity (05/12/2024)  Recent Concern: Food Insecurity - Food Insecurity Present (03/03/2024)  Housing: Low Risk  (05/12/2024)  Transportation Needs: No Transportation Needs (05/12/2024)  Utilities: Not At Risk (05/12/2024)  Financial Resource Strain: High Risk (10/08/2023)   Received from [facility]  Physical Activity: Unknown (04/07/2023)   Received from [facility]  Social Connections: Moderately Isolated (05/12/2024)  Stress: Stress Concern Present (04/07/2023)   Received from [facility]  Tobacco Use: High Risk (05/12/2024)    Readmission Risk Interventions    03/13/2024    3:54 PM 03/04/2024    2:47 PM 01/14/2024    2:11 PM  Readmission Risk Prevention Plan  Transportation Screening Complete Complete Complete  Medication Review Oceanographer) Complete Complete Complete  PCP or Specialist appointment within 3-5 days of discharge Complete Complete Complete  HRI or Home Care Consult Complete Complete --  SW Recovery Care/Counseling Consult Complete Complete --  Palliative Care Screening Complete  Not Applicable Not Applicable  Skilled Nursing Facility Not Applicable Not Applicable Not Applicable

## 2024-05-14 NOTE — Progress Notes (Signed)
 Initial Nutrition Assessment  DOCUMENTATION CODES:   Non-severe (moderate) malnutrition in context of chronic illness  INTERVENTION:   -Ensure Plus High Protein po TID, each supplement provides 350 kcal and 20 grams of protein.   -Liberalized diet to regular  NUTRITION DIAGNOSIS:   Moderate Malnutrition related to chronic illness as evidenced by moderate fat depletion, severe muscle depletion.  GOAL:   Patient will meet greater than or equal to 90% of their needs  MONITOR:   PO intake, Supplement acceptance  REASON FOR ASSESSMENT:   Consult Assessment of nutrition requirement/status  ASSESSMENT:   68 y.o. female with medical history significant of COPD, chronic hypoxemic respiratory failure, adenocarcinoma of the lung status post left upper lobectomy, paroxysmal A-fib, hypothyroidism, anxiety, PTSD, who had recent hospitalization in 7/25 for an L3 compression fracture.  She presents with right-sided back now and there is concern for new T8 compression fracture as well as L5 subacute fracture.  Patient in room, states she is having worsening stomach pain yesterday and today. Appetite not the best. Ate a small bit of breakfast. Open to drinking Ensure supplements, brought one to bedside.  Per chart review, pt has been vomiting this afternoon.  Per weight records, pt with no significant weight changes. UBW had been ~105 lbs. Current weight: 115 lbs.  Medications: Senokot, IV Mg sulfate, Zofran , Compazine   Labs reviewed.  NUTRITION - FOCUSED PHYSICAL EXAM:  Flowsheet Row Most Recent Value  Orbital Region Severe depletion  Upper Arm Region Moderate depletion  Thoracic and Lumbar Region Mild depletion  Buccal Region Moderate depletion  Temple Region Moderate depletion  Clavicle Bone Region Severe depletion  Clavicle and Acromion Bone Region Severe depletion  Scapular Bone Region Severe depletion  Dorsal Hand Severe depletion  [h/o left hand injury]  Patellar Region  Severe depletion  Anterior Thigh Region Severe depletion  Posterior Calf Region Severe depletion  Edema (RD Assessment) None  Hair Reviewed  Eyes Reviewed  [blind in right eye, cataract in left]  Mouth Reviewed  Skin Reviewed  Nails Reviewed    Diet Order:   Diet Order             Diet regular Fluid consistency: Thin  Diet effective now                   EDUCATION NEEDS:   No education needs have been identified at this time  Skin:  Skin Assessment: Reviewed RN Assessment  Last BM:  9/3  Height:   Ht Readings from Last 1 Encounters:  05/12/24 5' 3 (1.6 m)    Weight:   Wt Readings from Last 1 Encounters:  05/12/24 52.2 kg    BMI:  Body mass index is 20.39 kg/m.  Estimated Nutritional Needs:   Kcal:  1600-1800  Protein:  75-90g  Fluid:  1.8L/day   Morna Lee, MS, RD, LDN Inpatient Clinical Dietitian Contact via Secure chat

## 2024-05-15 ENCOUNTER — Inpatient Hospital Stay (HOSPITAL_COMMUNITY)

## 2024-05-15 DIAGNOSIS — M549 Dorsalgia, unspecified: Secondary | ICD-10-CM

## 2024-05-15 DIAGNOSIS — F419 Anxiety disorder, unspecified: Secondary | ICD-10-CM

## 2024-05-15 DIAGNOSIS — S22060A Wedge compression fracture of T7-T8 vertebra, initial encounter for closed fracture: Secondary | ICD-10-CM | POA: Diagnosis not present

## 2024-05-15 DIAGNOSIS — F32A Depression, unspecified: Secondary | ICD-10-CM

## 2024-05-15 DIAGNOSIS — J9611 Chronic respiratory failure with hypoxia: Secondary | ICD-10-CM | POA: Diagnosis not present

## 2024-05-15 DIAGNOSIS — Z720 Tobacco use: Secondary | ICD-10-CM | POA: Diagnosis not present

## 2024-05-15 LAB — CBC WITH DIFFERENTIAL/PLATELET
Abs Immature Granulocytes: 0.04 K/uL (ref 0.00–0.07)
Basophils Absolute: 0 K/uL (ref 0.0–0.1)
Basophils Relative: 0 %
Eosinophils Absolute: 0.2 K/uL (ref 0.0–0.5)
Eosinophils Relative: 2 %
HCT: 41.4 % (ref 36.0–46.0)
Hemoglobin: 12.3 g/dL (ref 12.0–15.0)
Immature Granulocytes: 0 %
Lymphocytes Relative: 19 %
Lymphs Abs: 1.9 K/uL (ref 0.7–4.0)
MCH: 28.1 pg (ref 26.0–34.0)
MCHC: 29.7 g/dL — ABNORMAL LOW (ref 30.0–36.0)
MCV: 94.5 fL (ref 80.0–100.0)
Monocytes Absolute: 0.7 K/uL (ref 0.1–1.0)
Monocytes Relative: 8 %
Neutro Abs: 6.8 K/uL (ref 1.7–7.7)
Neutrophils Relative %: 71 %
Platelets: 214 K/uL (ref 150–400)
RBC: 4.38 MIL/uL (ref 3.87–5.11)
RDW: 13.7 % (ref 11.5–15.5)
WBC: 9.6 K/uL (ref 4.0–10.5)
nRBC: 0 % (ref 0.0–0.2)

## 2024-05-15 LAB — COMPREHENSIVE METABOLIC PANEL WITH GFR
ALT: 15 U/L (ref 0–44)
AST: 16 U/L (ref 15–41)
Albumin: 3.6 g/dL (ref 3.5–5.0)
Alkaline Phosphatase: 112 U/L (ref 38–126)
Anion gap: 10 (ref 5–15)
BUN: 10 mg/dL (ref 8–23)
CO2: 32 mmol/L (ref 22–32)
Calcium: 9.6 mg/dL (ref 8.9–10.3)
Chloride: 98 mmol/L (ref 98–111)
Creatinine, Ser: 0.39 mg/dL — ABNORMAL LOW (ref 0.44–1.00)
GFR, Estimated: >60 mL/min (ref >60)
Glucose, Bld: 93 mg/dL (ref 70–99)
Potassium: 3.8 mmol/L (ref 3.5–5.1)
Sodium: 140 mmol/L (ref 135–145)
Total Bilirubin: 0.3 mg/dL (ref 0.0–1.2)
Total Protein: 6.3 g/dL — ABNORMAL LOW (ref 6.5–8.1)

## 2024-05-15 LAB — T4, FREE: Free T4: 1.05 ng/dL (ref 0.61–1.12)

## 2024-05-15 LAB — TSH: TSH: 6.43 u[IU]/mL — ABNORMAL HIGH (ref 0.350–4.500)

## 2024-05-15 LAB — PHOSPHORUS: Phosphorus: 2.8 mg/dL (ref 2.5–4.6)

## 2024-05-15 LAB — MAGNESIUM: Magnesium: 1.8 mg/dL (ref 1.7–2.4)

## 2024-05-15 MED ORDER — MAGNESIUM SULFATE 2 GM/50ML IV SOLN
2.0000 g | Freq: Once | INTRAVENOUS | Status: AC
  Administered 2024-05-15: 2 g via INTRAVENOUS
  Filled 2024-05-15: qty 50

## 2024-05-15 NOTE — Plan of Care (Signed)
  Problem: Clinical Measurements: Goal: Ability to maintain clinical measurements within normal limits will improve Outcome: Progressing Goal: Will remain free from infection Outcome: Progressing Goal: Diagnostic test results will improve Outcome: Progressing Goal: Respiratory complications will improve Outcome: Progressing   Problem: Nutrition: Goal: Adequate nutrition will be maintained Outcome: Progressing   Problem: Coping: Goal: Level of anxiety will decrease Outcome: Progressing   Problem: Elimination: Goal: Will not experience complications related to bowel motility Outcome: Progressing Goal: Will not experience complications related to urinary retention Outcome: Progressing   Problem: Pain Managment: Goal: General experience of comfort will improve and/or be controlled Outcome: Progressing   Problem: Safety: Goal: Ability to remain free from injury will improve Outcome: Progressing   Problem: Skin Integrity: Goal: Risk for impaired skin integrity will decrease Outcome: Progressing

## 2024-05-15 NOTE — Progress Notes (Signed)
 PROGRESS NOTE    Tracy Park  FMW:[id number] DOB: 03-21-1956 DOA: 05/12/2024 PCP: Corlis Longs, FNP   Brief Narrative:  Tracy Park is a 68 y.o. female with medical history significant of COPD, chronic hypoxemic respiratory failure, adenocarcinoma of the lung status post left upper lobectomy, paroxysmal A-fib, hypothyroidism, anxiety, PTSD, who had recent hospitalization in 7/25 for an L3 compression fracture.  She presents with right-sided back now and there is concern for new T8 compression fracture as well as L5 subacute fracture.  Obtaining MRI of the T-spine and L-spine and I have been consulted for further evaluation.  Palliative is also been consulted for further pain control and she placed on the PCA pump however this is not compatible with MRI so we need to get her off of this for now.  Further direction of care has been to be pending her MRI results.  Assessment and Plan:  Acute on Chronic Back Pain due to Compression Fractures: Presents with significant Back Pain. CT Scan done and showed Acute appearing T8 Compression Fx and subacute LF Compression Fx of L5 since prior examination of 04/10/2024 with approximately 20-30% loss of height and no retropulsion Noted to also have Progressive loss of height of the L1 vertebral body now 10-20% with no retropulsion and . Stable superior endplate fractures of T11, T12, L2, L3, and L4 status post vertebroplasty at L3 and L4. The CT Scans also mentioned stable grade 2 anterolisthesis L5-S1 with resultant severe left and moderate to severe right neuroforaminal narrowing with probable impingement of the exiting L5 nerve roots bilaterally. She continues to smoke. Admitted for Pain Control.  On gentle IV fluid hydration with LR at 40 mL/h for 1 day.  Continue with Acetaminophen  650 mg po q6hprn Mild Pain, Hydrocodone -Acetaminophen  1-2 po q6hprn Moderate Pain and Hydromorphone  0.5 mg q4hprn Severe Pain.  PT/OT are recommending SNF. IR consulted in the  morning and they are requesting an MRI T and L Spine to see if she will be a candidate for repeat kyphoplasty.  MRI was attempted yesterday but unable to be done given her pain and inability lay flat and anxiety.  She is now on a Hydromorphone  PCA pump and pain is better controlled however still not able to do with PCA pump so we will need to hold this temporarily.  Consulted Palliative Care with a added K Pad Aquathermia; Currently enrolled w/ [facility] and will continue Hospice Support after D/C.    COPD/Chronic Hypoxemic Hypercapnic Respiratory Failure / Adenocarcinoma of the Lung s/p Left Upper Lobe Lobectomy: Patient does report some productive cough, however denies any worsening.  Will continue patient's inhaler w/ Breztri  160-9-4.8 mcg/act IH 2 puff, Duoneb 3 mL BID and continue with as needed Albuterol  3 mL IH q6hprn Wheezing and SOB. Will also place on a short course of Doxycycline . Will consider Steroids pending Kyphoplasty   Leukocytosis: Mild and improving. WBC went from 15.8 -> 13.3 -> 11.1. CTM and Trend and repeat CBC in the AM   Paroxysmal A-fib: CTM on Telemetry. Patient at this time is unsure if she takes Apixaban .  According to the previous documentation, she was continued on Eliquis  upon discharge to rehab.  Will hold Apixban for now in case she requires any procedure for her fractures.  C/w Metoprolol  Succinate 12.5 mg po daily    Hypothyroidism: Check TSH in the AM. Continue Levothyroxine  125 mcg po Daily .   Hyperlipidemia: C/w Rosuvastatin  20 mg po Daily   Essential HTN: Hold Losartan   12.5 mg po daily; C/w Metoprolol   Succinate 12.5 mg po Daily. CTM BP per Protocol. Last BP reading was 110/71   Anxiety and Depression and PTSD: Continue w/ Sertraline  25 mg po Daily  Glaucoma: C/w Prednisolone  1% Ophthalmic    Moderate Protein Calorie Malnutrition: Dietitian consulted for further evaluation. C/w Ensure Plus High Protein 237. Nutrition Status: Nutrition Problem: Moderate  Malnutrition Etiology: chronic illness Signs/Symptoms: moderate fat depletion, severe muscle depletion Interventions: Ensure Enlive (each supplement provides 350kcal and 20 grams of protein)  Tobacco Abuse: Smoking Cessation Counseling given. C/w Nicotine  14 mg TD q24h   DVT prophylaxis: enoxaparin  (LOVENOX ) injection 40 mg Start: 05/13/24 1000 Place TED hose Start: 05/12/24 2223    Code Status: Limited: Do not attempt resuscitation (DNR) -DNR-LIMITED -Do Not Intubate/DNI  Family Communication: No family at bedside  Disposition Plan:  Level of care: Telemetry Status is: Inpatient Remains inpatient appropriate because: Needs further clinical improvement and clearance   Consultants:  Palliative Care Medicine  Procedures:  As delineated as above  Antimicrobials:  Anti-infectives (From admission, onward)    Start     Dose/Rate Route Frequency Ordered Stop   05/12/24 2245  doxycycline  (VIBRA -TABS) tablet 100 mg        100 mg Oral Every 12 hours 05/12/24 2230 05/19/24 2159       Subjective: Seen and examined at bedside and states that her pain is still there but do controlled with Dilaudid  PCA.  Having severe anxiety about being in an MRI scanner due to her extreme pain and has been refusing multiple doses overall.  No nausea or vomiting.  Feels okay otherwise.  Plan is for MRI under sedation in the AM.  No other concerns or complaints at this time.  Objective: Vitals:   05/15/24 0924 05/15/24 1015 05/15/24 1217 05/15/24 1244  BP:  125/70  131/86  Pulse:  78  95  Resp:   16 18  Temp:  98.3 F (36.8 C)  98.1 F (36.7 C)  TempSrc:  Oral    SpO2: 100% 93% 96% 94%  Weight:      Height:        Intake/Output Summary (Last 24 hours) at 05/15/2024 1620 Last data filed at 05/15/2024 1300 Gross per 24 hour  Intake 480 ml  Output 200 ml  Net 280 ml   Filed Weights   05/12/24 2300  Weight: 52.2 kg   Examination: Physical Exam:  Constitutional: Thin older appearing than her  stated age chronically ill-appearing Caucasian female who appears a little uncomfortable Respiratory: Diminished to auscultation bilaterally with some coarse breath sounds and has some rhonchi expiratory wheezing but no appreciable crackles or rales.  Has a normal respiratory effort and is not tachypneic.  Has supplemental oxygen  via nasal cannula Cardiovascular: RRR, no murmurs / rubs / gallops. S1 and S2 auscultated. No extremity edema. Abdomen: Soft, non-tender, non-distended. Bowel sounds positive.  GU: Deferred. Musculoskeletal: No clubbing / cyanosis of digits/nails. No joint deformity upper and lower extremities. Skin: No rashes, lesions, ulcers on a limited skin evaluation. No induration; Warm and dry.  Neurologic: CN 2-12 grossly intact with no focal deficits. Romberg sign cerebellar reflexes not assessed.  Psychiatric: Normal judgment and insight. Alert and oriented x 3.   Data Reviewed: I have personally reviewed following labs and imaging studies  CBC: Recent Labs  Lab 05/12/24 1712 05/13/24 0528 05/14/24 0448 05/15/24 0642  WBC 15.8* 13.3* 11.1* 9.6  NEUTROABS  --   --  7.8* 6.8  HGB  12.4 12.3 12.2 12.3  HCT 41.2 40.6 40.9 41.4  MCV 94.1 95.8 95.1 94.5  PLT 245 207 207 214   Basic Metabolic Panel: Recent Labs  Lab 05/12/24 1712 05/13/24 0528 05/14/24 0448 05/15/24 0642  NA 143 141 141 140  K 3.6 4.0 3.8 3.8  CL 103 101 100 98  CO2 27 28 29  32  GLUCOSE 110* 88 80 93  BUN 13 14 16 10   CREATININE 0.49 0.42* 0.44 0.39*  CALCIUM  9.8 9.7 9.3 9.6  MG  --   --  1.8 1.8  PHOS  --   --  3.4 2.8   GFR: Estimated Creatinine Clearance: 57 mL/min (A) (by C-G formula based on SCr of 0.39 mg/dL (L)). Liver Function Tests: Recent Labs  Lab 05/14/24 0448 05/15/24 0642  AST 17 16  ALT 17 15  ALKPHOS 118 112  BILITOT 0.4 0.3  PROT 6.3* 6.3*  ALBUMIN 3.7 3.6   No results for input(s): LIPASE, AMYLASE in the last 168 hours. No results for input(s): AMMONIA in  the last 168 hours. Coagulation Profile: Recent Labs  Lab 05/13/24 0528  INR 1.0   Cardiac Enzymes: No results for input(s): CKTOTAL, CKMB, CKMBINDEX, TROPONINI in the last 168 hours. BNP (last 3 results) No results for input(s): PROBNP in the last 8760 hours. HbA1C: No results for input(s): HGBA1C in the last 72 hours. CBG: No results for input(s): GLUCAP in the last 168 hours. Lipid Profile: No results for input(s): CHOL, HDL, LDLCALC, TRIG, CHOLHDL, LDLDIRECT in the last 72 hours. Thyroid  Function Tests: Recent Labs    05/15/24 0642 05/15/24 0953  TSH 6.430*  --   FREET4  --  1.05   Anemia Panel: No results for input(s): VITAMINB12, FOLATE, FERRITIN, TIBC, IRON , RETICCTPCT in the last 72 hours. Sepsis Labs: No results for input(s): PROCALCITON, LATICACIDVEN in the last 168 hours.  No results found for this or any previous visit (from the past 240 hours).   Radiology Studies: DG CHEST PORT 1 VIEW Result Date: 05/15/2024 CLINICAL DATA:  Shortness of breath. EXAM: PORTABLE CHEST 1 VIEW COMPARISON:  05/14/2024 FINDINGS: Pleuroparenchymal scarring in the left apex and upper lobe is stable with volume loss again noted left hemithorax. Right lung appears hyperexpanded but clear. Interstitial markings are diffusely coarsened with chronic features. Cardiopericardial silhouette is at upper limits of normal for size. No acute bony abnormality. Telemetry leads overlie the chest. IMPRESSION: Chronic pleuroparenchymal scarring in the left apex and upper lobe with volume loss in the left hemithorax. No acute cardiopulmonary findings. Electronically Signed   By: Camellia Candle M.D.   On: 05/15/2024 08:29   DG CHEST PORT 1 VIEW Result Date: 05/14/2024 CLINICAL DATA:  [id number].  Shortness of breath. EXAM: PORTABLE CHEST 1 VIEW COMPARISON:  Portable chest 04/10/2024, CTA chest 05/12/2024. FINDINGS: 5:11 a.m. There are stable post treatment/postsurgical  changes on the left with right upper lobectomy and scarring, volume loss and chronic right apical opacity. The lungs emphysematous and clear of infiltrates. The sulci are sharp. The mediastinum is stable with shift to the left. The cardiac size is normal. No new osseous finding. IMPRESSION: No evidence of acute chest disease. Stable post treatment/postsurgical changes on the left. Emphysema. Electronically Signed   By: Francis Quam M.D.   On: 05/14/2024 06:03   Scheduled Meds:  budesonide -glycopyrrolate -formoterol   2 puff Inhalation BID   doxycycline   100 mg Oral Q12H   enoxaparin  (LOVENOX ) injection  40 mg Subcutaneous Q24H   feeding supplement  237 mL Oral TID BM   guaiFENesin   600 mg Oral BID   HYDROmorphone    Intravenous Q4H    HYDROmorphone  (DILAUDID ) injection  1 mg Intravenous Once   ipratropium-albuterol   3 mL Nebulization BID   levothyroxine   125 mcg Oral Q0600   LORazepam   1 mg Intravenous Once   metoprolol  succinate  12.5 mg Oral Daily   nicotine   14 mg Transdermal Daily   prednisoLONE  acetate  1 drop Left Eye BID   rosuvastatin   20 mg Oral Daily   senna  1 tablet Oral BID   sertraline   25 mg Oral Daily   Continuous Infusions:   LOS: 2 days   Alejandro Marker, DO [facility] Available via Epic secure chat 7am-7pm After these hours, please refer to coverage provider listed on [email or web address] 05/15/2024, 4:20 PM

## 2024-05-15 NOTE — Progress Notes (Signed)
 Daily Progress Note   Patient Name: Tracy Park       Date: 05/15/2024 DOB: 01/20/1956  Age: 68 y.o. MRN#: [id number] Attending Physician: Sherrill Alejandro Donovan, DO Primary Care Physician: Corlis Longs, FNP Admit Date: 05/12/2024  Reason for Consultation/Follow-up: Establishing goals of care and Pain control  Subjective:  Awake alert, complains of back pain but better controlled on the bolus only PCA, patient states that she has severe claustrophobia, wants sedation for MRI.    Length of Stay: 2  Current Medications: Scheduled Meds:   budesonide -glycopyrrolate -formoterol   2 puff Inhalation BID   doxycycline   100 mg Oral Q12H   enoxaparin  (LOVENOX ) injection  40 mg Subcutaneous Q24H   feeding supplement  237 mL Oral TID BM   guaiFENesin   600 mg Oral BID   HYDROmorphone    Intravenous Q4H    HYDROmorphone  (DILAUDID ) injection  1 mg Intravenous Once   ipratropium-albuterol   3 mL Nebulization BID   levothyroxine   125 mcg Oral Q0600   LORazepam   1 mg Intravenous Once   metoprolol  succinate  12.5 mg Oral Daily   nicotine   14 mg Transdermal Daily   prednisoLONE  acetate  1 drop Left Eye BID   rosuvastatin   20 mg Oral Daily   senna  1 tablet Oral BID   sertraline   25 mg Oral Daily    Continuous Infusions:  magnesium  sulfate bolus IVPB      PRN Meds: acetaminophen  **OR** acetaminophen , albuterol , benzonatate , diphenhydrAMINE  **OR** diphenhydrAMINE , HYDROmorphone  (DILAUDID ) injection, LORazepam , naloxone  **AND** sodium chloride  flush, ondansetron  (ZOFRAN ) IV, ondansetron  **OR** [DISCONTINUED] ondansetron  (ZOFRAN ) IV, polyethylene glycol, prochlorperazine   Physical Exam         Awake alert Back pain Resting in bed Regular work of breathing No edema  Vital Signs: BP 133/72 (BP  Location: Right Arm)   Pulse 84   Temp 98.1 F (36.7 C)   Resp 16   Ht 5' 3 (1.6 m)   Wt 52.2 kg   SpO2 100%   BMI 20.39 kg/m  SpO2: SpO2: 100 % O2 Device: O2 Device: Nasal Cannula O2 Flow Rate: O2 Flow Rate (L/min): 2 L/min  Intake/output summary:  Intake/Output Summary (Last 24 hours) at 05/15/2024 1213 Last data filed at 05/15/2024 0745 Gross per 24 hour  Intake 240 ml  Output 200 ml  Net 40 ml  LBM: Last BM Date : 05/12/24 Baseline Weight: Weight: 52.2 kg Most recent weight: Weight: 52.2 kg       Palliative Assessment/Data:      Patient Active Problem List   Diagnosis Date Noted   Compression fracture of T8 vertebra (HCC) 05/12/2024   Closed compression fracture of L3 vertebra (HCC) 03/10/2024   Compression fracture of lumbar vertebra (HCC) 03/10/2024   Lumbar compression fracture (HCC) 03/10/2024   Hypokalemia 02/06/2024   Factitious disorder with predominantly psychological signs and symptoms 01/30/2024   Palliative care encounter 01/30/2024   Need for emotional support 01/30/2024   Counseling and coordination of care 01/30/2024   Goals of care, counseling/discussion 01/30/2024   COPD exacerbation (HCC) 01/30/2024   Acute on chronic respiratory failure with hypoxia and hypercapnia (HCC) 12/13/2023   COPD with acute exacerbation 09/22/2023   Paroxysmal atrial fibrillation (HCC) 02/23/2023   Pulmonary nodule 01/25/2023   COPD (chronic obstructive pulmonary disease) (HCC) 01/25/2023   Essential hypertension 01/25/2023   DDD (degenerative disc disease), cervical 03/24/2022   Bipolar disorder (HCC) 03/24/2022   Dyslipidemia 03/13/2022   Anxiety and depression 03/13/2022   History of lung cancer 03/13/2022   Chronic respiratory failure with hypoxia and hypercapnia (HCC) 02/26/2022   Normocytic anemia 02/08/2022   Malnutrition of moderate degree 08/25/2021   Coronary artery disease involving native coronary artery of native heart without angina pectoris  08/18/2018   Chronic bilateral low back pain without sciatica 08/21/2016   Obstructive sleep apnea (adult) (pediatric) 02/22/2015   Hyperlipidemia 06/03/2013   Vitamin D  deficiency 06/03/2013   Hypothyroidism 04/28/2012   Tobacco use 04/28/2012    Palliative Care Assessment & Plan   Patient Profile:    Assessment:  Tracy Park is a 68 y.o. female with medical history significant of COPD, chronic hypoxemic respiratory failure, adenocarcinoma of the lung status post left upper lobectomy, paroxysmal A-fib, hypothyroidism, anxiety, PTSD, who had recent hospitalization in 7/25 for an L3 compression fracture.  She presented with right-sided back now and there is concern for new T8 compression fracture as well as L5 subacute fracture.    MRI of the T-spine and L-spine has been requested for further evaluation.  Palliative  consulted for further pain control.   Recommendations/Plan:  Dilaudid  bolus only PCA Agree with MRI L and T spine: wants MRI done under anesthesia citing severe claustrophobia.  Agree with current bowel and anti emetic regimen. Miralax  Senokot and Zofran .  PMT to follow.    Code Status:    Code Status Orders  (From admission, onward)           Start     Ordered   05/12/24 2223  Do not attempt resuscitation (DNR)- Limited -Do Not Intubate (DNI)  Continuous       Question Answer Comment  If pulseless and not breathing No CPR or chest compressions.   In Pre-Arrest Conditions (Patient Is Breathing and Has A Pulse) Do not intubate. Provide all appropriate non-invasive medical interventions. Avoid ICU transfer unless indicated or required.   Consent: Discussion documented in EHR or advanced directives reviewed      05/12/24 2230           Code Status History     Date Active Date Inactive Code Status Order ID Comments User Context   03/16/2024 2324 03/23/2024 1723 Limited: Do not attempt resuscitation (DNR) -DNR-LIMITED -Do Not Intubate/DNI  [id number]   [name] POUR, NP Inpatient   03/10/2024 0921 03/16/2024 2324 Full Code [id number]  Perri,  A Meliton Raddle., MD ED   03/10/2024 0734 03/10/2024 0921 Full Code [id number]  Perri DELENA Meliton Raddle., MD ED   03/10/2024 0153 03/10/2024 0734 Limited: Do not attempt resuscitation (DNR) -DNR-LIMITED -Do Not Intubate/DNI  [id number]  [name], DO ED   03/02/2024 2338 03/09/2024 0012 Limited: Do not attempt resuscitation (DNR) -DNR-LIMITED -Do Not Intubate/DNI  [id number]  [name], MD ED   02/06/2024 0102 02/09/2024 2157 Limited: Do not attempt resuscitation (DNR) -DNR-LIMITED -Do Not Intubate/DNI  [id number]  [name], MD Inpatient   01/29/2024 2130 01/31/2024 1746 Limited: Do not attempt resuscitation (DNR) -DNR-LIMITED -Do Not Intubate/DNI  [id number]  [name], MD ED   01/13/2024 2251 01/23/2024 1550 Limited: Do not attempt resuscitation (DNR) -DNR-LIMITED -Do Not Intubate/DNI  [id number]  [name], MD ED   01/02/2024 1950 01/07/2024 1833 Limited: Do not attempt resuscitation (DNR) -DNR-LIMITED -Do Not Intubate/DNI  [id number]  [name], MD Inpatient   12/28/2023 2137 12/30/2023 1838 Limited: Do not attempt resuscitation (DNR) -DNR-LIMITED -Do Not Intubate/DNI  [id number]  [name], MD ED   12/13/2023 0434 12/19/2023 1705 Limited: Do not attempt resuscitation (DNR) -DNR-LIMITED -Do Not Intubate/DNI  [id number]  [name], MD ED   11/01/2023 1624 11/07/2023 1849 Limited: Do not attempt resuscitation (DNR) -DNR-LIMITED -Do Not Intubate/DNI  [id number]  [name], MD ED   09/22/2023 2146 09/28/2023 2207 Limited: Do not attempt resuscitation (DNR) -DNR-LIMITED -Do Not Intubate/DNI  [id number]  [name], MD ED   08/04/2023 0227 08/09/2023 1843 Limited: Do not attempt resuscitation (DNR) -DNR-LIMITED -Do Not Intubate/DNI  [id number]  [name], MD Inpatient   08/03/2023 2204 08/04/2023 0227 Full Code [id number]  [name], MD ED   07/24/2023 2244 07/27/2023 2024  Limited: Do not attempt resuscitation (DNR) -DNR-LIMITED -Do Not Intubate/DNI  [id number]  [name], MD ED   06/25/2023 0003 06/27/2023 2009 Limited: Do not attempt resuscitation (DNR) -DNR-LIMITED -Do Not Intubate/DNI  [id number]  [name], MD ED   06/19/2023 1739 06/22/2023 2128 Do not attempt resuscitation (DNR) PRE-ARREST INTERVENTIONS DESIRED [id number]  [name], MD ED   06/04/2023 1509 06/06/2023 1814 Do not attempt resuscitation (DNR) PRE-ARREST INTERVENTIONS DESIRED [id number]  [name], MD ED   04/28/2023 0741 05/01/2023 1830 DNR [id number]  [name], MD ED   04/17/2023 0151 04/18/2023 1944 DNR [id number]  [name], DO Inpatient   04/11/2023 2132 04/14/2023 0035 Full Code [id number]  Hugelmeyer, Thersia, DO ED   03/26/2023 0938 03/27/2023 1838 DNR [id number]  [name], MD Inpatient   03/06/2023 2249 03/10/2023 2107 DNR [id number]  [name], DO ED   02/23/2023 2046 02/26/2023 2245 DNR [id number]  [name], MD ED   01/25/2023 2350 01/28/2023 1928 DNR [id number]  [name], DO Inpatient   01/08/2023 0235 01/13/2023 1928 DNR [id number]  [name], DO Inpatient   01/08/2023 0117 01/08/2023 0235 Full Code [id number]  [name], DO ED   03/24/2022 1159 03/28/2022 1546 Full Code [id number]  [name], MD ED   03/14/2022 2132 03/16/2022 1602 Full Code [id number]  [name], MD Inpatient   03/14/2022 0331 03/14/2022 2132 DNR [id number]  Neda Jennet DELENA, MD ED   03/13/2022 2149 03/14/2022 0331 DNR [id number]  [name], MD ED   02/26/2022 0200 03/01/2022 1555 DNR [id number]  [name], DO ED   08/16/2021 2316 08/25/2021 2324 DNR [id number]  [name], MD ED   08/16/2021  2253 08/16/2021 2316 Full Code [id number]  [name], MD ED   05/03/2021 0052 05/05/2021 1756 Full Code [id number]  [name], MD ED   04/10/2020 0342 04/12/2020 2137 Full Code [id number]  [name], MD ED   04/28/2012 [phone] 04/28/2012 2047 Full Code [id number]   [name], RN Inpatient       Prognosis:  Unable to determine  Discharge Planning: To Be Determined  Care plan was discussed with patient and [facility] colleague.   Thank you for allowing the Palliative Medicine Team to assist in the care of this patient. Mod MDM     Greater than 50%  of this time was spent counseling and coordinating care related to the above assessment and plan.  Lonia Serve, MD  Please contact Palliative Medicine Team phone at [phone] for questions and concerns.

## 2024-05-16 ENCOUNTER — Inpatient Hospital Stay (HOSPITAL_COMMUNITY)

## 2024-05-16 ENCOUNTER — Other Ambulatory Visit (HOSPITAL_COMMUNITY)

## 2024-05-16 ENCOUNTER — Encounter (HOSPITAL_COMMUNITY): Payer: Self-pay | Admitting: Family Medicine

## 2024-05-16 DIAGNOSIS — S22060A Wedge compression fracture of T7-T8 vertebra, initial encounter for closed fracture: Secondary | ICD-10-CM | POA: Diagnosis not present

## 2024-05-16 DIAGNOSIS — F419 Anxiety disorder, unspecified: Secondary | ICD-10-CM | POA: Diagnosis not present

## 2024-05-16 DIAGNOSIS — Z720 Tobacco use: Secondary | ICD-10-CM | POA: Diagnosis not present

## 2024-05-16 DIAGNOSIS — J9611 Chronic respiratory failure with hypoxia: Secondary | ICD-10-CM | POA: Diagnosis not present

## 2024-05-16 DIAGNOSIS — J9612 Chronic respiratory failure with hypercapnia: Secondary | ICD-10-CM | POA: Diagnosis not present

## 2024-05-16 LAB — COMPREHENSIVE METABOLIC PANEL WITH GFR
ALT: 14 U/L (ref 0–44)
AST: 21 U/L (ref 15–41)
Albumin: 3.9 g/dL (ref 3.5–5.0)
Alkaline Phosphatase: 121 U/L (ref 38–126)
Anion gap: 13 (ref 5–15)
BUN: 12 mg/dL (ref 8–23)
CO2: 33 mmol/L — ABNORMAL HIGH (ref 22–32)
Calcium: 10 mg/dL (ref 8.9–10.3)
Chloride: 97 mmol/L — ABNORMAL LOW (ref 98–111)
Creatinine, Ser: 0.49 mg/dL (ref 0.44–1.00)
GFR, Estimated: >60 mL/min (ref >60)
Glucose, Bld: 108 mg/dL — ABNORMAL HIGH (ref 70–99)
Potassium: 4.2 mmol/L (ref 3.5–5.1)
Sodium: 142 mmol/L (ref 135–145)
Total Bilirubin: 0.3 mg/dL (ref 0.0–1.2)
Total Protein: 7 g/dL (ref 6.5–8.1)

## 2024-05-16 LAB — CBC WITH DIFFERENTIAL/PLATELET
Abs Immature Granulocytes: 0.05 K/uL (ref 0.00–0.07)
Basophils Absolute: 0.1 K/uL (ref 0.0–0.1)
Basophils Relative: 1 %
Eosinophils Absolute: 0.2 K/uL (ref 0.0–0.5)
Eosinophils Relative: 2 %
HCT: 42.2 % (ref 36.0–46.0)
Hemoglobin: 12.6 g/dL (ref 12.0–15.0)
Immature Granulocytes: 1 %
Lymphocytes Relative: 19 %
Lymphs Abs: 1.9 K/uL (ref 0.7–4.0)
MCH: 28.5 pg (ref 26.0–34.0)
MCHC: 29.9 g/dL — ABNORMAL LOW (ref 30.0–36.0)
MCV: 95.5 fL (ref 80.0–100.0)
Monocytes Absolute: 0.8 K/uL (ref 0.1–1.0)
Monocytes Relative: 8 %
Neutro Abs: 7.1 K/uL (ref 1.7–7.7)
Neutrophils Relative %: 69 %
Platelets: 249 K/uL (ref 150–400)
RBC: 4.42 MIL/uL (ref 3.87–5.11)
RDW: 13.7 % (ref 11.5–15.5)
WBC: 10.1 K/uL (ref 4.0–10.5)
nRBC: 0 % (ref 0.0–0.2)

## 2024-05-16 LAB — MAGNESIUM: Magnesium: 1.9 mg/dL (ref 1.7–2.4)

## 2024-05-16 LAB — T3, FREE: T3, Free: 2.5 pg/mL (ref 2.0–4.4)

## 2024-05-16 LAB — PHOSPHORUS: Phosphorus: 3.7 mg/dL (ref 2.5–4.6)

## 2024-05-16 NOTE — Progress Notes (Signed)
 Daily Progress Note   Patient Name: Tracy Park       Date: 05/16/2024 DOB: 09-Feb-1956  Age: 68 y.o. MRN#: [id number] Attending Physician: Patsy Lenis, MD Primary Care Physician: Corlis Longs, FNP Admit Date: 05/12/2024  Reason for Consultation/Follow-up: Establishing goals of care and Pain control  Subjective:  Awake alert, complains of back pain but better controlled on the bolus only PCA, patient states that she has severe claustrophobia, wants sedation for MRI. MRI not able to be done this am, likely being scheduled for am with sedation. Has nausea/vomiting - attributes it to anxiety.    Length of Stay: 3  Current Medications: Scheduled Meds:   budesonide -glycopyrrolate -formoterol   2 puff Inhalation BID   doxycycline   100 mg Oral Q12H   enoxaparin  (LOVENOX ) injection  40 mg Subcutaneous Q24H   feeding supplement  237 mL Oral TID BM   guaiFENesin   600 mg Oral BID   HYDROmorphone    Intravenous Q4H    HYDROmorphone  (DILAUDID ) injection  1 mg Intravenous Once   ipratropium-albuterol   3 mL Nebulization BID   levothyroxine   125 mcg Oral Q0600   LORazepam   1 mg Intravenous Once   metoprolol  succinate  12.5 mg Oral Daily   nicotine   14 mg Transdermal Daily   prednisoLONE  acetate  1 drop Left Eye BID   rosuvastatin   20 mg Oral Daily   senna  1 tablet Oral BID   sertraline   25 mg Oral Daily    Continuous Infusions:    PRN Meds: acetaminophen  **OR** acetaminophen , albuterol , benzonatate , diphenhydrAMINE  **OR** diphenhydrAMINE , HYDROmorphone  (DILAUDID ) injection, LORazepam , naloxone  **AND** sodium chloride  flush, ondansetron  (ZOFRAN ) IV, ondansetron  **OR** [DISCONTINUED] ondansetron  (ZOFRAN ) IV, polyethylene glycol, prochlorperazine   Physical Exam         Awake alert Back  pain Resting in bed Regular work of breathing No edema  Vital Signs: BP 129/81 (BP Location: Right Arm)   Pulse 88   Temp 98 F (36.7 C) (Oral)   Resp 16   Ht 5' 3 (1.6 m)   Wt 52.2 kg   SpO2 98%   BMI 20.39 kg/m  SpO2: SpO2: 98 % O2 Device: O2 Device: Nasal Cannula O2 Flow Rate: O2 Flow Rate (L/min): 2 L/min  Intake/output summary:  Intake/Output Summary (Last 24 hours) at 05/16/2024 1009 Last data filed at 05/16/2024 0703 Gross  per 24 hour  Intake 520.19 ml  Output --  Net 520.19 ml   LBM: Last BM Date : 05/14/24 Baseline Weight: Weight: 52.2 kg Most recent weight: Weight: 52.2 kg       Palliative Assessment/Data:      Patient Active Problem List   Diagnosis Date Noted   Compression fracture of T8 vertebra (HCC) 05/12/2024   Closed compression fracture of L3 vertebra (HCC) 03/10/2024   Compression fracture of lumbar vertebra (HCC) 03/10/2024   Lumbar compression fracture (HCC) 03/10/2024   Hypokalemia 02/06/2024   Factitious disorder with predominantly psychological signs and symptoms 01/30/2024   Palliative care encounter 01/30/2024   Need for emotional support 01/30/2024   Counseling and coordination of care 01/30/2024   Goals of care, counseling/discussion 01/30/2024   COPD exacerbation (HCC) 01/30/2024   Acute on chronic respiratory failure with hypoxia and hypercapnia (HCC) 12/13/2023   COPD with acute exacerbation 09/22/2023   Paroxysmal atrial fibrillation (HCC) 02/23/2023   Pulmonary nodule 01/25/2023   COPD (chronic obstructive pulmonary disease) (HCC) 01/25/2023   Essential hypertension 01/25/2023   DDD (degenerative disc disease), cervical 03/24/2022   Bipolar disorder (HCC) 03/24/2022   Dyslipidemia 03/13/2022   Anxiety and depression 03/13/2022   History of lung cancer 03/13/2022   Chronic respiratory failure with hypoxia and hypercapnia (HCC) 02/26/2022   Normocytic anemia 02/08/2022   Malnutrition of moderate degree 08/25/2021   Coronary  artery disease involving native coronary artery of native heart without angina pectoris 08/18/2018   Chronic bilateral low back pain without sciatica 08/21/2016   Obstructive sleep apnea (adult) (pediatric) 02/22/2015   Hyperlipidemia 06/03/2013   Vitamin D  deficiency 06/03/2013   Hypothyroidism 04/28/2012   Tobacco use 04/28/2012    Palliative Care Assessment & Plan   Patient Profile:    Assessment:  Tracy Park is a 68 y.o. female with medical history significant of COPD, chronic hypoxemic respiratory failure, adenocarcinoma of the lung status post left upper lobectomy, paroxysmal A-fib, hypothyroidism, anxiety, PTSD, who had recent hospitalization in 7/25 for an L3 compression fracture.  She presented with right-sided back now and there is concern for new T8 compression fracture as well as L5 subacute fracture.    MRI of the T-spine and L-spine has been requested for further evaluation.  Palliative  consulted for further pain control.   Recommendations/Plan:  Dilaudid  bolus only PCA - needs noted.  Agree with MRI L and T spine: wants MRI done under anesthesia citing severe claustrophobia.  Agree with current bowel and anti emetic regimen. Miralax  Senokot and Zofran .  PMT to follow.    Code Status:    Code Status Orders  (From admission, onward)           Start     Ordered   05/12/24 2223  Do not attempt resuscitation (DNR)- Limited -Do Not Intubate (DNI)  Continuous       Question Answer Comment  If pulseless and not breathing No CPR or chest compressions.   In Pre-Arrest Conditions (Patient Is Breathing and Has A Pulse) Do not intubate. Provide all appropriate non-invasive medical interventions. Avoid ICU transfer unless indicated or required.   Consent: Discussion documented in EHR or advanced directives reviewed      05/12/24 2230           Code Status History     Date Active Date Inactive Code Status Order ID Comments User Context   03/16/2024 2324  03/23/2024 1723 Limited: Do not attempt resuscitation (DNR) -DNR-LIMITED -Do Not  Intubate/DNI  [id number]  [name] POUR, NP Inpatient   03/10/2024 0921 03/16/2024 2324 Full Code [id number]  [name], MD ED   03/10/2024 [phone] 03/10/2024 0921 Full Code [id number]  [name], MD ED   03/10/2024 0153 03/10/2024 0734 Limited: Do not attempt resuscitation (DNR) -DNR-LIMITED -Do Not Intubate/DNI  [id number]  [name], DO ED   03/02/2024 2338 03/09/2024 0012 Limited: Do not attempt resuscitation (DNR) -DNR-LIMITED -Do Not Intubate/DNI  [id number]  [name], MD ED   02/06/2024 0102 02/09/2024 2157 Limited: Do not attempt resuscitation (DNR) -DNR-LIMITED -Do Not Intubate/DNI  [id number]  [name], MD Inpatient   01/29/2024 2130 01/31/2024 1746 Limited: Do not attempt resuscitation (DNR) -DNR-LIMITED -Do Not Intubate/DNI  [id number]  [name], MD ED   01/13/2024 2251 01/23/2024 1550 Limited: Do not attempt resuscitation (DNR) -DNR-LIMITED -Do Not Intubate/DNI  [id number]  [name], MD ED   01/02/2024 1950 01/07/2024 1833 Limited: Do not attempt resuscitation (DNR) -DNR-LIMITED -Do Not Intubate/DNI  [id number]  [name], MD Inpatient   12/28/2023 2137 12/30/2023 1838 Limited: Do not attempt resuscitation (DNR) -DNR-LIMITED -Do Not Intubate/DNI  [id number]  [name], MD ED   12/13/2023 0434 12/19/2023 1705 Limited: Do not attempt resuscitation (DNR) -DNR-LIMITED -Do Not Intubate/DNI  [id number]  [name], MD ED   11/01/2023 1624 11/07/2023 1849 Limited: Do not attempt resuscitation (DNR) -DNR-LIMITED -Do Not Intubate/DNI  [id number]  [name], MD ED   09/22/2023 2146 09/28/2023 2207 Limited: Do not attempt resuscitation (DNR) -DNR-LIMITED -Do Not Intubate/DNI  [id number]  [name], MD ED   08/04/2023 0227 08/09/2023 1843 Limited: Do not attempt resuscitation (DNR) -DNR-LIMITED -Do Not Intubate/DNI  [id number]  [name], MD Inpatient    08/03/2023 2204 08/04/2023 0227 Full Code [id number]  [name], MD ED   07/24/2023 2244 07/27/2023 2024 Limited: Do not attempt resuscitation (DNR) -DNR-LIMITED -Do Not Intubate/DNI  [id number]  [name], MD ED   06/25/2023 0003 06/27/2023 2009 Limited: Do not attempt resuscitation (DNR) -DNR-LIMITED -Do Not Intubate/DNI  [id number]  [name], MD ED   06/19/2023 1739 06/22/2023 2128 Do not attempt resuscitation (DNR) PRE-ARREST INTERVENTIONS DESIRED [id number]  [name], MD ED   06/04/2023 1509 06/06/2023 1814 Do not attempt resuscitation (DNR) PRE-ARREST INTERVENTIONS DESIRED [id number]  [name], MD ED   04/28/2023 0741 05/01/2023 1830 DNR [id number]  [name], MD ED   04/17/2023 0151 04/18/2023 1944 DNR [id number]  [name], DO Inpatient   04/11/2023 2132 04/14/2023 0035 Full Code [id number]  Hugelmeyer, Thersia, DO ED   03/26/2023 0938 03/27/2023 1838 DNR [id number]  [name], MD Inpatient   03/06/2023 2249 03/10/2023 2107 DNR [id number]  [name], DO ED   02/23/2023 2046 02/26/2023 2245 DNR [id number]  [name], MD ED   01/25/2023 2350 01/28/2023 1928 DNR [id number]  [name], DO Inpatient   01/08/2023 0235 01/13/2023 1928 DNR [id number]  [name], DO Inpatient   01/08/2023 0117 01/08/2023 0235 Full Code [id number]  [name], DO ED   03/24/2022 1159 03/28/2022 1546 Full Code [id number]  [name], MD ED   03/14/2022 2132 03/16/2022 1602 Full Code [id number]  [name], MD Inpatient   03/14/2022 0331 03/14/2022 2132 DNR [id number]  Neda Jennet DELENA, MD ED   03/13/2022 2149 03/14/2022 0331 DNR [id number]  [name], MD ED   02/26/2022 0200 03/01/2022 1555 DNR [id number]  Tu,  Alfrieda DASEN, DO ED   08/16/2021 2316 08/25/2021 2324 DNR [id number]  [name], MD ED   08/16/2021 2253 08/16/2021 2316 Full Code [id number]  [name], MD ED   05/03/2021 0052 05/05/2021 1756 Full Code [id number]  [name], MD ED   04/10/2020 0342 04/12/2020  2137 Full Code [id number]  [name], MD ED   04/28/2012 [phone] 04/28/2012 2047 Full Code [id number]  [name], RN Inpatient       Prognosis:  Unable to determine  Discharge Planning: To Be Determined  Care plan was discussed with patient and RN.   Thank you for allowing the Palliative Medicine Team to assist in the care of this patient. Mod MDM     Greater than 50%  of this time was spent counseling and coordinating care related to the above assessment and plan.  Lonia Serve, MD  Please contact Palliative Medicine Team phone at [phone] for questions and concerns.

## 2024-05-16 NOTE — Plan of Care (Signed)
  Problem: Education: Goal: Knowledge of General Education information will improve Description: Including pain rating scale, medication(s)/side effects and non-pharmacologic comfort measures Outcome: Progressing   Problem: Clinical Measurements: Goal: Ability to maintain clinical measurements within normal limits will improve Outcome: Progressing   Problem: Clinical Measurements: Goal: Will remain free from infection Outcome: Progressing   Problem: Clinical Measurements: Goal: Respiratory complications will improve Outcome: Progressing   Problem: Activity: Goal: Risk for activity intolerance will decrease Outcome: Progressing

## 2024-05-16 NOTE — Progress Notes (Deleted)
 For MRI, patient needs conscious sedation, will need a nurse or SWOT nurse to go with her to [facility] for MRI to provide medication. MRI needs to be rescheduled for Monday, September 7th, 2025.

## 2024-05-16 NOTE — Plan of Care (Signed)

## 2024-05-16 NOTE — Progress Notes (Signed)
 Progress Note    Tracy Park   FMW:[id number]  DOB: 07/12/1956  DOA: 05/12/2024     3 PCP: Corlis Longs, FNP  Initial CC: Back pain  Hospital Course: Tracy Park is a 68 y.o. female with medical history significant of COPD, chronic hypoxemic respiratory failure, adenocarcinoma of the lung status post left upper lobectomy, paroxysmal A-fib, hypothyroidism, anxiety, PTSD, who had recent hospitalization in 7/25 for an L3 compression fracture.   She presents with right-sided back now and there is concern for new T8 compression fracture as well as L5 subacute fracture.  Radiology was consulted for kyphoplasty on admission and she was recommended to undergo MRI T and L-spine. Due to significant claustrophobia and anxiety, this is to be arranged under anesthesia at [facility].  Assessment and Plan:  Acute on Chronic Back Pain due to Compression Fractures:  - Denies any recent trauma or falls - Presents with significant Back Pain. - CT T and L-spine show concern for T8 and L5 compression fractures - After review by interventional radiology, she may undergo further intervention with kyphoplasty to T8, 12 and L1, L5 - Awaiting MRI T and L-spine; planning to be performed under anesthesia at [facility] on 9/8 - Continue pain control otherwise; currently on Dilaudid  PCA; palliative care following as well   COPD Chronic hypoxemia with hypercapnia History of lung adenocarcinoma s/p LUL lobectomy - Currently on doxycycline  course for recent COPD exacerbation prior to admission - Currently stable and improved - Continue O2  Leukocytosis: Mild and improving.    Paroxysmal A-fib: CTM on Telemetry. Patient at this time is unsure if she takes Apixaban .  According to the previous documentation, she was continued on Eliquis  upon discharge to rehab.  Will hold Apixban for now in case she requires any procedure for her fractures.  C/w Metoprolol  Succinate 12.5 mg po daily    Hypothyroidism:  Checked TSH and was 6.430. Free T4 was 1.05. Continue Levothyroxine  125 mcg po Daily and repeat TFTs in 4-6 weeks.   Hyperlipidemia: C/w Rosuvastatin  20 mg po Daily   Essential HTN: Hold Losartan  12.5 mg po daily; C/w Metoprolol   Succinate 12.5 mg po Daily   Anxiety and Depression and PTSD: Continue w/ Sertraline  25 mg po Daily  Glaucoma: C/w Prednisolone  1% Ophthalmic    Moderate Protein Calorie Malnutrition: Dietitian consulted for further evaluation. C/w Ensure Plus High Protein 237. Nutrition Status: Nutrition Problem: Moderate Malnutrition Etiology: chronic illness Signs/Symptoms: moderate fat depletion, severe muscle depletion Interventions: Ensure Enlive (each supplement provides 350kcal and 20 grams of protein)  Tobacco Abuse: Smoking Cessation Counseling given. C/w Nicotine  14 mg TD q24h  Interval History:  No events overnight.  Still having ongoing back pain but is comfortable however requiring Dilaudid  PCA.  Tentative plan is for MRI tomorrow.  Further plan to be determined per IR after that for kyphoplasty.   Old records reviewed in assessment of this patient  Antimicrobials: Doxycycline   DVT prophylaxis:  enoxaparin  (LOVENOX ) injection 40 mg Start: 05/13/24 1000 Place TED hose Start: 05/12/24 2223   Code Status:   Code Status: Limited: Do not attempt resuscitation (DNR) -DNR-LIMITED -Do Not Intubate/DNI   Mobility Assessment (Last 72 Hours)     Mobility Assessment     Row Name 05/16/24 0900 05/15/24 2050 05/15/24 1221 05/15/24 1045 05/14/24 2300   Does the patient have exclusion criteria? No - Perform mobility assessment No - Perform mobility assessment No - Perform mobility assessment No - Perform mobility assessment No - Perform  mobility assessment   What is the highest level of mobility based on the mobility assessment? Level 3 (Stands with assistance) - Balance while standing  and cannot march in place Level 3 (Stands with assistance) - Balance while standing   and cannot march in place Level 3 (Stands with assistance) - Balance while standing  and cannot march in place Level 3 (Stands with assistance) - Balance while standing  and cannot march in place Level 3 (Stands with assistance) - Balance while standing  and cannot march in place   Is the above level different from baseline mobility prior to current illness? Yes - Recommend PT order Yes - Recommend PT order -- Yes - Recommend PT order --    Row Name 05/14/24 1400 05/14/24 1346 05/14/24 0750 05/13/24 2051     Does the patient have exclusion criteria? -- -- No - Perform mobility assessment No - Perform mobility assessment    What is the highest level of mobility based on the mobility assessment? Level 3 (Stands with assistance) - Balance while standing  and cannot march in place Level 3 (Stands with assistance) - Balance while standing  and cannot march in place Level 4 (Ambulates with assistance) - Balance while stepping forward/back - Complete Level 4 (Ambulates with assistance) - Balance while stepping forward/back - Complete    Is the above level different from baseline mobility prior to current illness? -- -- -- Yes - Recommend PT order       Barriers to discharge: None Disposition Plan: Home HH orders placed: TBD Status is: Inpatient  Objective: Blood pressure 133/81, pulse 90, temperature 98.3 F (36.8 C), temperature source Oral, resp. rate 16, height 5' 3 (1.6 m), weight 52.2 kg, SpO2 98%.  Examination:  Physical Exam Constitutional:      Appearance: Normal appearance.  HENT:     Head: Normocephalic and atraumatic.     Mouth/Throat:     Mouth: Mucous membranes are moist.  Eyes:     Extraocular Movements: Extraocular movements intact.  Cardiovascular:     Rate and Rhythm: Normal rate and regular rhythm.  Pulmonary:     Effort: Pulmonary effort is normal. No respiratory distress.     Breath sounds: No wheezing.     Comments: Mild coarse bilateral breath sounds Abdominal:      General: Bowel sounds are normal. There is no distension.     Palpations: Abdomen is soft.     Tenderness: There is no abdominal tenderness.  Musculoskeletal:        General: Normal range of motion.     Cervical back: Normal range of motion and neck supple.  Skin:    General: Skin is warm and dry.  Neurological:     General: No focal deficit present.     Mental Status: She is alert.  Psychiatric:        Mood and Affect: Mood normal.      Consultants:  IR  Procedures:    Data Reviewed: Results for orders placed or performed during the hospital encounter of 05/12/24 (from the past 24 hours)  CBC with Differential/Platelet     Status: Abnormal   Collection Time: 05/16/24  5:31 AM  Result Value Ref Range   WBC 10.1 4.0 - 10.5 K/uL   RBC 4.42 3.87 - 5.11 MIL/uL   Hemoglobin 12.6 12.0 - 15.0 g/dL   HCT 57.7 63.9 - 53.9 %   MCV 95.5 80.0 - 100.0 fL   MCH 28.5 26.0 - 34.0  pg   MCHC 29.9 (L) 30.0 - 36.0 g/dL   RDW 86.2 88.4 - 84.4 %   Platelets 249 150 - 400 K/uL   nRBC 0.0 0.0 - 0.2 %   Neutrophils Relative % 69 %   Neutro Abs 7.1 1.7 - 7.7 K/uL   Lymphocytes Relative 19 %   Lymphs Abs 1.9 0.7 - 4.0 K/uL   Monocytes Relative 8 %   Monocytes Absolute 0.8 0.1 - 1.0 K/uL   Eosinophils Relative 2 %   Eosinophils Absolute 0.2 0.0 - 0.5 K/uL   Basophils Relative 1 %   Basophils Absolute 0.1 0.0 - 0.1 K/uL   Immature Granulocytes 1 %   Abs Immature Granulocytes 0.05 0.00 - 0.07 K/uL  Comprehensive metabolic panel with GFR     Status: Abnormal   Collection Time: 05/16/24  5:31 AM  Result Value Ref Range   Sodium 142 135 - 145 mmol/L   Potassium 4.2 3.5 - 5.1 mmol/L   Chloride 97 (L) 98 - 111 mmol/L   CO2 33 (H) 22 - 32 mmol/L   Glucose, Bld 108 (H) 70 - 99 mg/dL   BUN 12 8 - 23 mg/dL   Creatinine, Ser 9.50 0.44 - 1.00 mg/dL   Calcium  10.0 8.9 - 10.3 mg/dL   Total Protein 7.0 6.5 - 8.1 g/dL   Albumin 3.9 3.5 - 5.0 g/dL   AST 21 15 - 41 U/L   ALT 14 0 - 44 U/L   Alkaline  Phosphatase 121 38 - 126 U/L   Total Bilirubin 0.3 0.0 - 1.2 mg/dL   GFR, Estimated >39 >39 mL/min   Anion gap 13 5 - 15  Phosphorus     Status: None   Collection Time: 05/16/24  5:31 AM  Result Value Ref Range   Phosphorus 3.7 2.5 - 4.6 mg/dL  Magnesium      Status: None   Collection Time: 05/16/24  5:31 AM  Result Value Ref Range   Magnesium  1.9 1.7 - 2.4 mg/dL    I have reviewed pertinent nursing notes, vitals, labs, and images as necessary. I have ordered labwork to follow up on as indicated.  I have reviewed the last notes from staff over past 24 hours. I have discussed patient's care plan and test results with nursing staff, CM/SW, and other staff as appropriate.  Time spent: Greater than 50% of the 55 minute visit was spent in counseling/coordination of care for the patient as laid out in the A&P.   LOS: 3 days   Alm Apo, MD [facility] 05/16/2024, 2:38 PM

## 2024-05-16 NOTE — Anesthesia Preprocedure Evaluation (Signed)
 Anesthesia Evaluation  Patient identified by MRN, date of birth, ID band Patient awake    Reviewed: Allergy & Precautions, NPO status , Patient's Chart, lab work & pertinent test results  History of Anesthesia Complications (+) Family history of anesthesia reaction  Airway Mallampati: I  TM Distance: >3 FB Neck ROM: Full    Dental no notable dental hx. (+) Dental Advisory Given, Edentulous Upper, Edentulous Lower   Pulmonary COPD,  COPD inhaler, Current Smoker S/P LUL   Pulmonary exam normal breath sounds clear to auscultation       Cardiovascular hypertension, + angina  + Past MI  Normal cardiovascular exam+ dysrhythmias Atrial Fibrillation  Rhythm:Regular Rate:Normal     Neuro/Psych  PSYCHIATRIC DISORDERS Anxiety Depression Bipolar Disorder      GI/Hepatic   Endo/Other  Hypothyroidism    Renal/GU Renal disease     Musculoskeletal   Abdominal   Peds  Hematology  (+) Blood dyscrasia, anemia Lab Results      Component                Value               Date                      WBC                      10.1                05/16/2024                HGB                      12.6                05/16/2024                HCT                      42.2                05/16/2024                MCV                      95.5                05/16/2024                PLT                      249                 05/16/2024              Anesthesia Other Findings All: See list  Reproductive/Obstetrics                              Anesthesia Physical Anesthesia Plan  ASA: 3  Anesthesia Plan: General   Post-op Pain Management: Precedex   Induction: Intravenous  PONV Risk Score and Plan: 3 and Treatment may vary due to age or medical condition, Ondansetron  and Midazolam   Airway Management Planned: Oral ETT  Additional Equipment: None  Intra-op Plan:   Post-operative Plan: Extubation in  OR  Informed Consent: I have reviewed the patients History  and Physical, chart, labs and discussed the procedure including the risks, benefits and alternatives for the proposed anesthesia with the patient or authorized representative who has indicated his/her understanding and acceptance.     Dental advisory given  Plan Discussed with: CRNA and Surgeon  Anesthesia Plan Comments:         Anesthesia Quick Evaluation

## 2024-05-17 ENCOUNTER — Encounter (HOSPITAL_COMMUNITY): Payer: Self-pay | Admitting: Family Medicine

## 2024-05-17 ENCOUNTER — Inpatient Hospital Stay (HOSPITAL_COMMUNITY): Admitting: Certified Registered Nurse Anesthetist

## 2024-05-17 ENCOUNTER — Encounter (HOSPITAL_COMMUNITY)
Admission: EM | Disposition: A | Discharge status: Skilled Nursing Facility | Payer: Self-pay | Source: Home / Self Care | Attending: Internal Medicine

## 2024-05-17 ENCOUNTER — Other Ambulatory Visit (HOSPITAL_COMMUNITY)

## 2024-05-17 ENCOUNTER — Inpatient Hospital Stay (HOSPITAL_COMMUNITY)

## 2024-05-17 DIAGNOSIS — S22060A Wedge compression fracture of T7-T8 vertebra, initial encounter for closed fracture: Secondary | ICD-10-CM | POA: Diagnosis not present

## 2024-05-17 DIAGNOSIS — M4854XA Collapsed vertebra, not elsewhere classified, thoracic region, initial encounter for fracture: Secondary | ICD-10-CM | POA: Diagnosis not present

## 2024-05-17 DIAGNOSIS — Z515 Encounter for palliative care: Secondary | ICD-10-CM

## 2024-05-17 DIAGNOSIS — I480 Paroxysmal atrial fibrillation: Secondary | ICD-10-CM | POA: Diagnosis not present

## 2024-05-17 DIAGNOSIS — I2510 Atherosclerotic heart disease of native coronary artery without angina pectoris: Secondary | ICD-10-CM | POA: Diagnosis not present

## 2024-05-17 DIAGNOSIS — J9611 Chronic respiratory failure with hypoxia: Secondary | ICD-10-CM | POA: Diagnosis not present

## 2024-05-17 DIAGNOSIS — S32050A Wedge compression fracture of fifth lumbar vertebra, initial encounter for closed fracture: Secondary | ICD-10-CM

## 2024-05-17 DIAGNOSIS — I10 Essential (primary) hypertension: Secondary | ICD-10-CM | POA: Diagnosis not present

## 2024-05-17 DIAGNOSIS — F17210 Nicotine dependence, cigarettes, uncomplicated: Secondary | ICD-10-CM | POA: Diagnosis not present

## 2024-05-17 DIAGNOSIS — S22069A Unspecified fracture of T7-T8 vertebra, initial encounter for closed fracture: Secondary | ICD-10-CM

## 2024-05-17 HISTORY — PX: RADIOLOGY WITH ANESTHESIA: SHX6223

## 2024-05-17 SURGERY — MRI WITH ANESTHESIA
Anesthesia: General

## 2024-05-17 MED ORDER — LIDOCAINE 2% (20 MG/ML) 5 ML SYRINGE
INTRAMUSCULAR | Status: DC | PRN
  Administered 2024-05-17: 100 mg via INTRAVENOUS

## 2024-05-17 MED ORDER — GLYCOPYRROLATE PF 0.2 MG/ML IJ SOSY
PREFILLED_SYRINGE | INTRAMUSCULAR | Status: DC | PRN
  Administered 2024-05-17: .1 mg via INTRAVENOUS

## 2024-05-17 MED ORDER — LACTATED RINGERS IV SOLN: INTRAVENOUS | Status: DC

## 2024-05-17 MED ORDER — DEXAMETHASONE SODIUM PHOSPHATE 10 MG/ML IJ SOLN
INTRAMUSCULAR | Status: DC | PRN
  Administered 2024-05-17: 5 mg via INTRAVENOUS

## 2024-05-17 MED ORDER — FENTANYL CITRATE (PF) 100 MCG/2ML IJ SOLN
INTRAMUSCULAR | Status: DC | PRN
  Administered 2024-05-17 (×2): 50 ug via INTRAVENOUS

## 2024-05-17 MED ORDER — ONDANSETRON HCL 4 MG/2ML IJ SOLN
INTRAMUSCULAR | Status: DC | PRN
  Administered 2024-05-17: 4 mg via INTRAVENOUS

## 2024-05-17 MED ORDER — PHENYLEPHRINE HCL-NACL 20-0.9 MG/250ML-% IV SOLN
INTRAVENOUS | Status: DC | PRN
  Administered 2024-05-17: 25 ug/min via INTRAVENOUS

## 2024-05-17 MED ORDER — CHLORHEXIDINE GLUCONATE 0.12 % MT SOLN: 15.0000 mL | Freq: Once | OROMUCOSAL | Status: AC

## 2024-05-17 MED ORDER — HYDROMORPHONE HCL 1 MG/ML IJ SOLN
0.2500 mg | INTRAMUSCULAR | Status: DC | PRN
  Administered 2024-05-17: 0.5 mg via INTRAVENOUS

## 2024-05-17 MED ORDER — ACETAMINOPHEN 10 MG/ML IV SOLN
1000.0000 mg | Freq: Once | INTRAVENOUS | Status: DC | PRN
  Administered 2024-05-17: 1000 mg via INTRAVENOUS

## 2024-05-17 MED ORDER — ONDANSETRON HCL 4 MG/2ML IJ SOLN: 4.0000 mg | Freq: Once | INTRAMUSCULAR | Status: DC | PRN

## 2024-05-17 MED ORDER — FENTANYL CITRATE (PF) 100 MCG/2ML IJ SOLN
INTRAMUSCULAR | Status: AC
  Filled 2024-05-17: qty 2

## 2024-05-17 MED ORDER — ACETAMINOPHEN 10 MG/ML IV SOLN
INTRAVENOUS | Status: AC
  Filled 2024-05-17: qty 100

## 2024-05-17 MED ORDER — ORAL CARE MOUTH RINSE
15.0000 mL | Freq: Once | OROMUCOSAL | Status: AC
  Administered 2024-05-17: 15 mL via OROMUCOSAL

## 2024-05-17 MED ORDER — PHENYLEPHRINE 80 MCG/ML (10ML) SYRINGE FOR IV PUSH (FOR BLOOD PRESSURE SUPPORT)
PREFILLED_SYRINGE | INTRAVENOUS | Status: DC | PRN
  Administered 2024-05-17: 80 ug via INTRAVENOUS
  Administered 2024-05-17 (×2): 160 ug via INTRAVENOUS

## 2024-05-17 MED ORDER — ENOXAPARIN SODIUM 40 MG/0.4ML IJ SOSY
40.0000 mg | PREFILLED_SYRINGE | Freq: Every day | INTRAMUSCULAR | Status: DC
  Administered 2024-05-17 – 2024-05-18 (×2): 40 mg via SUBCUTANEOUS
  Filled 2024-05-17 (×2): qty 0.4

## 2024-05-17 MED ORDER — EPHEDRINE SULFATE-NACL 50-0.9 MG/10ML-% IV SOSY
PREFILLED_SYRINGE | INTRAVENOUS | Status: DC | PRN
  Administered 2024-05-17: 5 mg via INTRAVENOUS

## 2024-05-17 MED ORDER — SUCCINYLCHOLINE CHLORIDE 200 MG/10ML IV SOSY
PREFILLED_SYRINGE | INTRAVENOUS | Status: DC | PRN
  Administered 2024-05-17: 60 mg via INTRAVENOUS

## 2024-05-17 MED ORDER — HYDROMORPHONE HCL 1 MG/ML IJ SOLN
INTRAMUSCULAR | Status: AC
  Filled 2024-05-17: qty 1

## 2024-05-17 MED ORDER — FENTANYL CITRATE (PF) 100 MCG/2ML IJ SOLN
25.0000 ug | INTRAMUSCULAR | Status: DC | PRN
  Administered 2024-05-17 (×2): 50 ug via INTRAVENOUS

## 2024-05-17 MED ORDER — PROPOFOL 10 MG/ML IV BOLUS
INTRAVENOUS | Status: DC | PRN
  Administered 2024-05-17: 180 mg via INTRAVENOUS

## 2024-05-17 MED ORDER — MIDAZOLAM HCL 5 MG/5ML IJ SOLN
INTRAMUSCULAR | Status: DC | PRN
  Administered 2024-05-17 (×2): 1 mg via INTRAVENOUS

## 2024-05-17 NOTE — Plan of Care (Signed)
  Problem: Education: Goal: Knowledge of General Education information will improve Description: Including pain rating scale, medication(s)/side effects and non-pharmacologic comfort measures Outcome: Progressing   Problem: Health Behavior/Discharge Planning: Goal: Ability to manage health-related needs will improve Outcome: Progressing   Problem: Clinical Measurements: Goal: Will remain free from infection Outcome: Progressing Goal: Diagnostic test results will improve Outcome: Progressing Goal: Respiratory complications will improve Outcome: Progressing   Problem: Activity: Goal: Risk for activity intolerance will decrease Outcome: Progressing   Problem: Nutrition: Goal: Adequate nutrition will be maintained Outcome: Progressing   Problem: Coping: Goal: Level of anxiety will decrease Outcome: Progressing   Problem: Elimination: Goal: Will not experience complications related to bowel motility Outcome: Progressing Goal: Will not experience complications related to urinary retention Outcome: Progressing   Problem: Pain Managment: Goal: General experience of comfort will improve and/or be controlled Outcome: Progressing   Problem: Safety: Goal: Ability to remain free from injury will improve Outcome: Progressing   Problem: Skin Integrity: Goal: Risk for impaired skin integrity will decrease Outcome: Progressing

## 2024-05-17 NOTE — Transfer of Care (Signed)
 Immediate Anesthesia Transfer of Care Note  Patient: Tracy Park  Procedure(s) Performed: MRI WITH ANESTHESIA  Patient Location: PACU  Anesthesia Type:General  Level of Consciousness: drowsy and patient cooperative  Airway & Oxygen  Therapy: Patient Spontanous Breathing and Patient connected to nasal cannula oxygen   Post-op Assessment: Report given to RN and Post -op Vital signs reviewed and stable  Post vital signs: Reviewed and stable  Last Vitals:  Vitals Value Taken Time  BP 149/91 05/17/24 12:10  Temp    Pulse 129 05/17/24 12:13  Resp 27 05/17/24 12:13  SpO2 95 % 05/17/24 12:13  Vitals shown include unfiled device data.  Last Pain:  Vitals:   05/17/24 1210  TempSrc:   PainSc: 8       Patients Stated Pain Goal: 2 (05/16/24 1343)  Complications: No notable events documented.

## 2024-05-17 NOTE — Addendum Note (Signed)
 Addendum  created 05/17/24 1558 by Jefm Garnette LABOR, MD   Intraprocedure Event edited, Intraprocedure Flowsheets edited, Intraprocedure Staff edited

## 2024-05-17 NOTE — Anesthesia Procedure Notes (Signed)
 Procedure Name: Intubation Date/Time: 05/17/2024 10:59 AM  Performed by: Claudene Arlin LABOR, CRNAPre-anesthesia Checklist: Patient identified, Emergency Drugs available, Suction available and Patient being monitored Patient Re-evaluated:Patient Re-evaluated prior to induction Oxygen  Delivery Method: Circle System Utilized Preoxygenation: Pre-oxygenation with 100% oxygen  Induction Type: IV induction and Rapid sequence Laryngoscope Size: Glidescope and 3 Grade View: Grade I Tube type: Oral Tube size: 7.0 mm Number of attempts: 1 Airway Equipment and Method: Video-laryngoscopy and Rigid stylet Placement Confirmation: ETT inserted through vocal cords under direct vision, positive ETCO2 and breath sounds checked- equal and bilateral Secured at: 21 cm Tube secured with: Tape Dental Injury: Teeth and Oropharynx as per pre-operative assessment  Comments: Elective glidescope intubation.

## 2024-05-17 NOTE — TOC Progression Note (Signed)
 Transition of Care [facility]) - Progression Note    Patient Details  Name: Tracy Park MRN: [id number] Date of Birth: Dec 17, 1955  Transition of Care [facility]) CM/SW Contact  Doneta Glenys DASEN, RN Phone Number: 05/17/2024, 4:54 PM  Clinical Narrative:    CM waiting results of MRI.   Expected Discharge Plan: Home w Home Health Services Barriers to Discharge: Continued Medical Work up               Expected Discharge Plan and Services In-house Referral: NA Discharge Planning Services: CM Consult Post Acute Care Choice: NA Living arrangements for the past 2 months: Mobile Home                 DME Arranged: N/A DME Agency: NA       HH Arranged: NA HH Agency: NA         Social Drivers of Health (SDOH) Interventions SDOH Screenings   Food Insecurity: No Food Insecurity (05/12/2024)  Recent Concern: Food Insecurity - Food Insecurity Present (03/03/2024)  Housing: Low Risk  (05/12/2024)  Transportation Needs: No Transportation Needs (05/12/2024)  Utilities: Not At Risk (05/12/2024)  Financial Resource Strain: High Risk (10/08/2023)   Received from [facility]  Physical Activity: Unknown (04/07/2023)   Received from [facility]  Social Connections: Moderately Isolated (05/12/2024)  Stress: Stress Concern Present (04/07/2023)   Received from [facility]  Tobacco Use: High Risk (05/17/2024)    Readmission Risk Interventions    03/13/2024    3:54 PM 03/04/2024    2:47 PM 01/14/2024    2:11 PM  Readmission Risk Prevention Plan  Transportation Screening Complete Complete Complete  Medication Review Oceanographer) Complete Complete Complete  PCP or Specialist appointment within 3-5 days of discharge Complete Complete Complete  HRI or Home Care Consult Complete Complete --  SW Recovery Care/Counseling Consult Complete Complete --  Palliative Care Screening Complete Not Applicable Not Applicable  Skilled Nursing Facility Not Applicable Not Applicable Not Applicable

## 2024-05-17 NOTE — Progress Notes (Signed)
 PT Cancellation Note  Patient Details Name: Tracy Park MRN: [id number] DOB: 13-Dec-1955   Cancelled Treatment:    Reason Eval/Treat Not Completed: Patient at procedure or test/unavailable (pt at [facility] for MRI under anesthesia. Will follow.)   Sylvan Delon Copp PT 05/17/2024  Acute Rehabilitation Services  Office [phone]

## 2024-05-17 NOTE — Plan of Care (Signed)
 PMT no charge note.   Patient off the unit - at [facility] for MRI with sedation.   PCA bolus only Dilaudid  medication noted.   PMT will follow hospital course and pain management needs.   No charge Lonia Serve MD [facility] palliative.

## 2024-05-17 NOTE — Progress Notes (Signed)
 [facility] PROGRESS NOTE   Tracy Park FMW:[id number] DOB: Jun 06, 1956 DOA: 05/12/2024  PCP: Corlis Longs, FNP  Brief History: 68 y.o. female with medical history significant of COPD, chronic hypoxemic respiratory failure, adenocarcinoma of the lung status post left upper lobectomy, paroxysmal A-fib, hypothyroidism, anxiety, PTSD, who had recent hospitalization in 7/25 for an L3 compression fracture.  She presents with right-sided back now and there is concern for new T8 compression fracture as well as L5 subacute fracture. Radiology was consulted for kyphoplasty on admission and she was recommended to undergo MRI T and L-spine. Due to significant claustrophobia and anxiety, this is to be arranged under anesthesia at [facility].  Consultants: Interventional radiology  Procedures: None yet.   Subjective/Interval History: Patient mentioned that her back pain is about 8 out of 10 in intensity.  Denies any other complaints such as shortness of breath nausea or vomiting.  Extremely claustrophobic and so plan is for MRI under anesthesia today.   Assessment/Plan:  Acute on Chronic Back Pain due to Compression Fractures:  - Denies any recent trauma or falls - Presents with significant Back Pain. - CT T and L-spine show concern for T8 and L5 compression fractures - After review by interventional radiology, she may undergo further intervention with kyphoplasty to T8, 12 and L1, L5 - Awaiting MRI T and L-spine; planning to be performed under anesthesia at [facility] on 9/8 - Palliative care was consulted for pain management.  Currently on Dilaudid  PCA.   COPD Chronic hypoxemia with hypercapnia History of lung adenocarcinoma s/p LUL lobectomy - Currently on doxycycline  course for recent COPD exacerbation prior to admission Respiratory status is stable.  Noted to be on oxygen .   Leukocytosis: Resolved   Paroxysmal A-fib:  Apixaban  currently on hold for procedure. Noted to be on  metoprolol . According to previous notes it was unclear if the patient was on Eliquis  or not but looks like she was discharged on Eliquis  after previous hospitalization.   Monitor on telemetry.   Hypothyroidism:  Checked TSH and was 6.430. Free T4 was 1.05. Continue Levothyroxine  125 mcg po Daily and repeat TFTs in 4-6 weeks.   Hyperlipidemia:  C/w Rosuvastatin  20 mg po Daily    Essential HTN:  Continue with metoprolol .  Losartan  is on hold.  Blood pressure is reasonably well-controlled.    Anxiety and Depression and PTSD:  Continue w/ Sertraline  25 mg po Daily   Moderate Protein Calorie Malnutrition C/w Ensure Plus High Protein 237.  Etiology: chronic illness Signs/Symptoms: moderate fat depletion, severe muscle depletion Interventions: Ensure Enlive (each supplement provides 350kcal and 20 grams of protein)   Tobacco Abuse: Smoking Cessation Counseling given. C/w Nicotine  14 mg TD q24h  DVT Prophylaxis: Lovenox  Code Status: DNR Family Communication: Discussed with patient.  No family at bedside Disposition Plan: To be determined     Medications: Scheduled:  [MAR Hold] budesonide -glycopyrrolate -formoterol   2 puff Inhalation BID   [MAR Hold] doxycycline   100 mg Oral Q12H   [MAR Hold] enoxaparin  (LOVENOX ) injection  40 mg Subcutaneous Q24H   [MAR Hold] feeding supplement  237 mL Oral TID BM   [MAR Hold] guaiFENesin   600 mg Oral BID   [MAR Hold] HYDROmorphone    Intravenous Q4H   [MAR Hold]  HYDROmorphone  (DILAUDID ) injection  1 mg Intravenous Once   [MAR Hold] ipratropium-albuterol   3 mL Nebulization BID   [MAR Hold] levothyroxine   125 mcg Oral Q0600   [MAR Hold] LORazepam   1 mg Intravenous Once   [MAR  Hold] metoprolol  succinate  12.5 mg Oral Daily   [MAR Hold] nicotine   14 mg Transdermal Daily   [MAR Hold] prednisoLONE  acetate  1 drop Left Eye BID   [MAR Hold] rosuvastatin   20 mg Oral Daily   [MAR Hold] senna  1 tablet Oral BID   [MAR Hold] sertraline   25 mg Oral Daily    Continuous:  lactated ringers      PRN:[MAR Hold] acetaminophen  **OR** [MAR Hold] acetaminophen , [MAR Hold] albuterol , [MAR Hold] benzonatate , [MAR Hold] diphenhydrAMINE  **OR** [MAR Hold] diphenhydrAMINE , [MAR Hold]  HYDROmorphone  (DILAUDID ) injection, [MAR Hold] naloxone  **AND** [MAR Hold] sodium chloride  flush, [MAR Hold] ondansetron  (ZOFRAN ) IV, [MAR Hold] ondansetron  **OR** [DISCONTINUED] ondansetron  (ZOFRAN ) IV, [MAR Hold] polyethylene glycol, [MAR Hold] prochlorperazine   Antibiotics: Anti-infectives (From admission, onward)    Start     Dose/Rate Route Frequency Ordered Stop   05/12/24 2245  [MAR Hold]  doxycycline  (VIBRA -TABS) tablet 100 mg        (MAR Hold since Mon 05/17/2024 at 0908.Hold Reason: Transfer to a Procedural area)   100 mg Oral Every 12 hours 05/12/24 2230 05/19/24 2159       Objective:  Vital Signs  Vitals:   05/17/24 0400 05/17/24 0527 05/17/24 0802 05/17/24 0919  BP:  (!) 141/77  139/88  Pulse:  81  94  Resp: 18 18 19 18   Temp:  97.9 F (36.6 C)  98.4 F (36.9 C)  TempSrc:  Oral  Oral  SpO2: 100% 100% 98% 96%  Weight:    53.5 kg  Height:    5' 3 (1.6 m)    Intake/Output Summary (Last 24 hours) at 05/17/2024 1029 Last data filed at 05/16/2024 2145 Gross per 24 hour  Intake 320 ml  Output 275 ml  Net 45 ml   Filed Weights   05/12/24 2300 05/17/24 0919  Weight: 52.2 kg 53.5 kg    General appearance: Awake alert.  In no distress Resp: Clear to auscultation bilaterally.  Normal effort Cardio: S1-S2 is normal regular.  No S3-S4.  No rubs murmurs or bruit GI: Abdomen is soft.  Nontender nondistended.  Bowel sounds are present normal.  No masses organomegaly Extremities: No edema.  Full range of motion of lower extremities. Neurologic: Alert and oriented x3.  No focal neurological deficits.    Lab Results:  Data Reviewed: I have personally reviewed following labs and reports of the imaging studies  CBC: Recent Labs  Lab 05/12/24 1712  05/13/24 0528 05/14/24 0448 05/15/24 0642 05/16/24 0531  WBC 15.8* 13.3* 11.1* 9.6 10.1  NEUTROABS  --   --  7.8* 6.8 7.1  HGB 12.4 12.3 12.2 12.3 12.6  HCT 41.2 40.6 40.9 41.4 42.2  MCV 94.1 95.8 95.1 94.5 95.5  PLT 245 207 207 214 249    Basic Metabolic Panel: Recent Labs  Lab 05/12/24 1712 05/13/24 0528 05/14/24 0448 05/15/24 0642 05/16/24 0531  NA 143 141 141 140 142  K 3.6 4.0 3.8 3.8 4.2  CL 103 101 100 98 97*  CO2 27 28 29  32 33*  GLUCOSE 110* 88 80 93 108*  BUN 13 14 16 10 12   CREATININE 0.49 0.42* 0.44 0.39* 0.49  CALCIUM  9.8 9.7 9.3 9.6 10.0  MG  --   --  1.8 1.8 1.9  PHOS  --   --  3.4 2.8 3.7    GFR: Estimated Creatinine Clearance: 57.2 mL/min (by C-G formula based on SCr of 0.49 mg/dL).  Liver Function Tests: Recent Labs  Lab 05/14/24 0448  05/15/24 0642 05/16/24 0531  AST 17 16 21   ALT 17 15 14   ALKPHOS 118 112 121  BILITOT 0.4 0.3 0.3  PROT 6.3* 6.3* 7.0  ALBUMIN 3.7 3.6 3.9    Coagulation Profile: Recent Labs  Lab 05/13/24 0528  INR 1.0    Thyroid  Function Tests: Recent Labs    05/15/24 0642 05/15/24 0952 05/15/24 0953  TSH 6.430*  --   --   FREET4  --   --  1.05  T3FREE  --  2.5  --     Radiology Studies: DG CHEST PORT 1 VIEW Result Date: 05/16/2024 EXAM: 1 VIEW XRAY OF THE CHEST 05/16/2024 01:04:00 AM COMPARISON: 05/15/2024 CLINICAL HISTORY: SOB (shortness of breath) [id number]. SOB FINDINGS: LUNGS AND PLEURA: Similar volume loss with left apex opacity. Similar airspace opacity in the right apex. Chronic interstitial thickening. No pleural effusion. No pneumothorax. HEART AND MEDIASTINUM: Surgical clips in left hilum. No acute abnormality of the cardiac and mediastinal silhouettes. BONES AND SOFT TISSUES: No acute osseous abnormality. IMPRESSION: 1. Similar volume loss with left apex opacity and similar airspace opacity in the right apex. 2. Chronic interstitial thickening. 3. No acute cardiopulmonary process. Electronically signed by:  Norman Gatlin MD 05/16/2024 01:12 AM EDT RP Workstation: [id number]       LOS: 4 days   Joette Pebbles  [facility] Pager on [email or web address]  05/17/2024, 10:29 AM

## 2024-05-17 NOTE — Anesthesia Postprocedure Evaluation (Signed)
 Anesthesia Post Note  Patient: Raniah L Pooler  Procedure(s) Performed: MRI WITH ANESTHESIA     Patient location during evaluation: PACU Anesthesia Type: General Level of consciousness: awake and alert Pain management: pain level controlled Vital Signs Assessment: post-procedure vital signs reviewed and stable Respiratory status: spontaneous breathing, nonlabored ventilation, respiratory function stable and patient connected to nasal cannula oxygen  Cardiovascular status: blood pressure returned to baseline and stable Postop Assessment: no apparent nausea or vomiting Anesthetic complications: no   No notable events documented.  Last Vitals:  Vitals:   05/17/24 1325 05/17/24 1338  BP: 121/78   Pulse: (!) 33 97  Resp: 20   Temp: 36.8 C   SpO2: 91% 100%    Last Pain:  Vitals:   05/17/24 1240  TempSrc:   PainSc: 7                  Garnette DELENA Gab

## 2024-05-18 ENCOUNTER — Encounter (HOSPITAL_COMMUNITY): Payer: Self-pay | Admitting: Radiology

## 2024-05-18 DIAGNOSIS — Z85118 Personal history of other malignant neoplasm of bronchus and lung: Secondary | ICD-10-CM | POA: Diagnosis not present

## 2024-05-18 DIAGNOSIS — S32050A Wedge compression fracture of fifth lumbar vertebra, initial encounter for closed fracture: Secondary | ICD-10-CM | POA: Diagnosis not present

## 2024-05-18 DIAGNOSIS — Z7189 Other specified counseling: Secondary | ICD-10-CM

## 2024-05-18 DIAGNOSIS — J9611 Chronic respiratory failure with hypoxia: Secondary | ICD-10-CM | POA: Diagnosis not present

## 2024-05-18 DIAGNOSIS — Z515 Encounter for palliative care: Secondary | ICD-10-CM | POA: Diagnosis not present

## 2024-05-18 DIAGNOSIS — R52 Pain, unspecified: Secondary | ICD-10-CM

## 2024-05-18 DIAGNOSIS — J449 Chronic obstructive pulmonary disease, unspecified: Secondary | ICD-10-CM

## 2024-05-18 DIAGNOSIS — S22060A Wedge compression fracture of T7-T8 vertebra, initial encounter for closed fracture: Secondary | ICD-10-CM | POA: Diagnosis not present

## 2024-05-18 DIAGNOSIS — Z79899 Other long term (current) drug therapy: Secondary | ICD-10-CM

## 2024-05-18 DIAGNOSIS — I480 Paroxysmal atrial fibrillation: Secondary | ICD-10-CM | POA: Diagnosis not present

## 2024-05-18 DIAGNOSIS — S22060S Wedge compression fracture of T7-T8 vertebra, sequela: Secondary | ICD-10-CM

## 2024-05-18 DIAGNOSIS — I10 Essential (primary) hypertension: Secondary | ICD-10-CM | POA: Diagnosis not present

## 2024-05-18 LAB — VITAMIN D 25 HYDROXY (VIT D DEFICIENCY, FRACTURES): Vit D, 25-Hydroxy: 36.93 ng/mL (ref 30–100)

## 2024-05-18 MED ORDER — SENNA 8.6 MG PO TABS
2.0000 | ORAL_TABLET | Freq: Two times a day (BID) | ORAL | Status: DC
  Administered 2024-05-18 – 2024-05-20 (×5): 17.2 mg via ORAL
  Filled 2024-05-18 (×5): qty 2

## 2024-05-18 MED ORDER — LIDOCAINE 5 % EX PTCH: 1.0000 | MEDICATED_PATCH | Freq: Once | CUTANEOUS | Status: DC

## 2024-05-18 MED ORDER — HYDROMORPHONE HCL 1 MG/ML IJ SOLN
1.0000 mg | INTRAMUSCULAR | Status: DC | PRN
  Administered 2024-05-18 – 2024-05-20 (×8): 1 mg via INTRAVENOUS
  Filled 2024-05-18 (×9): qty 1

## 2024-05-18 MED ORDER — OXYCODONE HCL 5 MG PO TABS
5.0000 mg | ORAL_TABLET | ORAL | Status: DC | PRN
  Administered 2024-05-18 – 2024-05-19 (×4): 10 mg via ORAL
  Filled 2024-05-18 (×5): qty 2

## 2024-05-18 MED ORDER — BISACODYL 10 MG RE SUPP: 10.0000 mg | Freq: Every day | RECTAL | Status: DC | PRN

## 2024-05-18 MED ORDER — POLYETHYLENE GLYCOL 3350 17 G PO PACK
17.0000 g | PACK | Freq: Two times a day (BID) | ORAL | Status: DC
  Administered 2024-05-18 – 2024-05-20 (×5): 17 g via ORAL
  Filled 2024-05-18 (×5): qty 1

## 2024-05-18 MED ORDER — HYDROMORPHONE HCL 1 MG/ML IJ SOLN
0.5000 mg | Freq: Once | INTRAMUSCULAR | Status: AC | PRN
Start: 2024-05-18 — End: 2024-05-19
  Administered 2024-05-19: 0.5 mg via INTRAVENOUS
  Filled 2024-05-18: qty 0.5

## 2024-05-18 NOTE — Progress Notes (Addendum)
 [facility] PROGRESS NOTE   Tracy Park FMW:[id number] DOB: [birth year] DOA: 05/12/2024  PCP: Corlis Longs, FNP  Brief History: 68 y.o. female with medical history significant of COPD, chronic hypoxemic respiratory failure, adenocarcinoma of the lung status post left upper lobectomy, paroxysmal A-fib, hypothyroidism, anxiety, PTSD, who had recent hospitalization in 7/25 for an L3 compression fracture.  She presents with right-sided back now and there is concern for new T8 compression fracture as well as L5 subacute fracture. Radiology was consulted for kyphoplasty on admission and she was recommended to undergo MRI T and L-spine. Due to significant claustrophobia and anxiety, this is to be arranged under anesthesia at [facility].  Consultants: Interventional radiology  Procedures: None yet.   Subjective/Interval History: Complains of 8 out of 10 pain in the back this morning.  Looks like she is in discomfort.  Complains of constipation.  Has been several days since she had a bowel movement.     Assessment/Plan:  Acute on Chronic Back Pain due to Compression Fractures:  - Denies any recent trauma or falls - Presents with significant Back Pain. - CT T and L-spine show concern for T8 and L5 compression fractures - After review by interventional radiology, she may undergo further intervention with kyphoplasty to T8, 12 and L1, L5 Patient underwent MRI of the thoracic and lumbar spine under general anesthesia on 9/8. Multiple compression fractures noted including T8, T11, T12, L1, L2, L5. Await reevaluation by IR regarding kyphoplasty. - Palliative care was consulted for pain management.  Currently on Dilaudid  PCA. Informed by IR that patient has too many compression fractures and not amenable to kyphoplasty.  Will need symptomatic management.  Patient has never been officially diagnosed with osteoporosis but has had steroids previously for her lung disease.  She likely does have  osteoporosis considering her multiple compression fractures.  Will check a vitamin D  level.  Will start her on vitamin D  and calcium .  Will also benefit from bisphosphonate.  Will discuss this with pharmacy.  Continue with PT and OT.  Will need to transition from PCA to oral pain medication regimen.  Will coordinate this with palliative care.  She will likely need short-term rehab at skilled nursing facility once her pain is reasonably well-controlled.   COPD Chronic hypoxemia with hypercapnia History of lung adenocarcinoma s/p LUL lobectomy - Currently on doxycycline  course for recent COPD exacerbation prior to admission Respiratory status is stable.  Noted to be on oxygen .  Constipation Has not had a bowel movement in several days.  She is noted to be on Senokot 1 tablet twice a day.  Has not received any MiraLAX .  Will increase Senokot to 2 tablets twice a day and add MiraLAX .  May need suppository. Constipation is most likely multifactorial including acute pain issues along with narcotic use.   Leukocytosis: Resolved   Paroxysmal A-fib:  Apixaban  currently on hold for procedure. Noted to be on metoprolol . According to previous notes it was unclear if the patient was on Eliquis  or not but looks like she was discharged on Eliquis  after previous hospitalization.   Monitor on telemetry.   Hypothyroidism:  Checked TSH and was 6.430. Free T4 was 1.05. Continue Levothyroxine  125 mcg po Daily and repeat TFTs in 4-6 weeks.   Hyperlipidemia:  C/w Rosuvastatin  20 mg po Daily    Essential HTN:  Continue with metoprolol .  Losartan  is on hold.  Blood pressure is reasonably well-controlled.    Anxiety and Depression and PTSD:  Continue w/  Sertraline  25 mg po Daily   Moderate Protein Calorie Malnutrition C/w Ensure Plus High Protein 237.  Etiology: chronic illness Signs/Symptoms: moderate fat depletion, severe muscle depletion Interventions: Ensure Enlive (each supplement provides 350kcal  and 20 grams of protein)   Tobacco Abuse: Smoking Cessation Counseling given. C/w Nicotine  14 mg TD q24h  DVT Prophylaxis: Lovenox  Code Status: DNR Family Communication: Discussed with patient.  No family at bedside Disposition Plan: To be determined     Medications: Scheduled:  budesonide -glycopyrrolate -formoterol   2 puff Inhalation BID   doxycycline   100 mg Oral Q12H   enoxaparin  (LOVENOX ) injection  40 mg Subcutaneous q1800   feeding supplement  237 mL Oral TID BM   guaiFENesin   600 mg Oral BID   HYDROmorphone    Intravenous Q4H    HYDROmorphone  (DILAUDID ) injection  1 mg Intravenous Once   ipratropium-albuterol   3 mL Nebulization BID   levothyroxine   125 mcg Oral Q0600   LORazepam   1 mg Intravenous Once   metoprolol  succinate  12.5 mg Oral Daily   nicotine   14 mg Transdermal Daily   prednisoLONE  acetate  1 drop Left Eye BID   rosuvastatin   20 mg Oral Daily   senna  1 tablet Oral BID   sertraline   25 mg Oral Daily   Continuous:   PRN:acetaminophen  **OR** acetaminophen , albuterol , benzonatate , diphenhydrAMINE  **OR** diphenhydrAMINE , HYDROmorphone  (DILAUDID ) injection, naloxone  **AND** sodium chloride  flush, ondansetron  (ZOFRAN ) IV, ondansetron  **OR** [DISCONTINUED] ondansetron  (ZOFRAN ) IV, polyethylene glycol, prochlorperazine   Antibiotics: Anti-infectives (From admission, onward)    Start     Dose/Rate Route Frequency Ordered Stop   05/12/24 2245  doxycycline  (VIBRA -TABS) tablet 100 mg        100 mg Oral Every 12 hours 05/12/24 2230 05/19/24 2159       Objective:  Vital Signs  Vitals:   05/18/24 0410 05/18/24 0622 05/18/24 0805 05/18/24 0825  BP:  (!) 141/83    Pulse:  82    Resp: (!) 22 20 20    Temp:  97.9 F (36.6 C)    TempSrc:      SpO2: 100% 100% 99% 99%  Weight:      Height:        Intake/Output Summary (Last 24 hours) at 05/18/2024 0911 Last data filed at 05/17/2024 1847 Gross per 24 hour  Intake 820 ml  Output 350 ml  Net 470 ml   Filed  Weights   05/12/24 2300 05/17/24 0919  Weight: 52.2 kg 53.5 kg    General appearance: Awake alert.  In no distress Resp: Clear to auscultation bilaterally.  Normal effort Cardio: S1-S2 is normal regular.  No S3-S4.  No rubs murmurs or bruit GI: Abdomen is soft.  Nontender nondistended.  Bowel sounds are present normal.  No masses organomegaly No focal neurological deficits.  Lab Results:  Data Reviewed: I have personally reviewed following labs and reports of the imaging studies  CBC: Recent Labs  Lab 05/12/24 1712 05/13/24 0528 05/14/24 0448 05/15/24 0642 05/16/24 0531  WBC 15.8* 13.3* 11.1* 9.6 10.1  NEUTROABS  --   --  7.8* 6.8 7.1  HGB 12.4 12.3 12.2 12.3 12.6  HCT 41.2 40.6 40.9 41.4 42.2  MCV 94.1 95.8 95.1 94.5 95.5  PLT 245 207 207 214 249    Basic Metabolic Panel: Recent Labs  Lab 05/12/24 1712 05/13/24 0528 05/14/24 0448 05/15/24 0642 05/16/24 0531  NA 143 141 141 140 142  K 3.6 4.0 3.8 3.8 4.2  CL 103 101 100 98 97*  CO2  27 28 29  32 33*  GLUCOSE 110* 88 80 93 108*  BUN 13 14 16 10 12   CREATININE 0.49 0.42* 0.44 0.39* 0.49  CALCIUM  9.8 9.7 9.3 9.6 10.0  MG  --   --  1.8 1.8 1.9  PHOS  --   --  3.4 2.8 3.7    GFR: Estimated Creatinine Clearance: 57.2 mL/min (by C-G formula based on SCr of 0.49 mg/dL).  Liver Function Tests: Recent Labs  Lab 05/14/24 0448 05/15/24 0642 05/16/24 0531  AST 17 16 21   ALT 17 15 14   ALKPHOS 118 112 121  BILITOT 0.4 0.3 0.3  PROT 6.3* 6.3* 7.0  ALBUMIN 3.7 3.6 3.9    Coagulation Profile: Recent Labs  Lab 05/13/24 0528  INR 1.0    Thyroid  Function Tests: Recent Labs    05/15/24 0952 05/15/24 0953  FREET4  --  1.05  T3FREE 2.5  --     Radiology Studies: MR LUMBAR SPINE WO CONTRAST Result Date: 05/17/2024 EXAM: MRI LUMBAR SPINE 05/17/2024 12:47:46 PM TECHNIQUE: Multiplanar multisequence MRI of the lumbar spine was performed without the administration of intravenous contrast. COMPARISON: CT lumbar  spine 04/30/24 and 05/12/24. Comparison CMD MRI thoracic spine. CLINICAL HISTORY: Compression fracture, lumbar. T \\T \ L spine fx. Abnormal CT's, done yesterday in PACS. FINDINGS: BONES AND ALIGNMENT: Acute compression fracture of L1 with edema along the superior endplate and approximately 20% height loss centrally. There is additional irregularity of the L2 endplates with edema along the L2 inferior endplate and approximately 35% height loss centrally. The degree of height loss appears slightly increased compared to the CT from 05/12/24. Compression deformities of L3 and L4 status post kyphoplasty without significant edema. There is evidence of acute fracture irregularity of the L5 superior endplate with associated edema and approximately 20% height loss. Lumbar lordosis is maintained. Similar anterolisthesis of L5 on S1. SPINAL CORD: The conus medullaris extends to the mid/lower L2 level. SOFT TISSUES: There is mild edema within the spinal musculature of the lower lumbar spine. L1-L2: At T12-L1 there is a small disc bulge and retropulsion of fracture fragments at the L1 superior endplate which results in slight indentation of the ventral thecal sac, greater on the right. No significant spinal canal or foraminal stenosis. At L1-2 there is a small disc bulge and retropulsion of fracture fragments at the L2 superior endplate without significant spinal canal stenosis. Mild facet arthrosis. No significant foraminal stenosis. L2-L3: At L2-3 there is a small disc bulge and minimal retropulsion at the L3 superior endplate without significant spinal canal stenosis. Bilateral facet arthrosis. No significant foraminal stenosis. L3-L4: At L3-4 there is a small disc bulge. Mild facet arthrosis. No significant spinal canal or foraminal stenosis. L4-L5: At L4-5 is a small disc bulge and minimal retropulsion at the L5 superior endplate. Mild-to-moderate facet arthrosis. No significant spinal canal stenosis. There is mild foraminal  stenosis on the right. L5-S1: At L5-S1 there is anterolisthesis with partial uncovering of the disc. Diffuse disc bulge and dense ventral thecal sac with mild lateral recess narrowing. Moderate-to-severe facet arthrosis. Mild spinal canal stenosis. There is mild right and moderate left foraminal stenosis. Disc bulge contacts the bilateral L5 nerve roots within the foramina. S1-S2: Rudimentary disc at the S1-2 level. There is transitional anatomy with a partially lumbarized S1 vertebra. IMPRESSION: 1. Acute compression fracture of L1 with edema along the superior endplate and approximately 20% height loss centrally. 2. Acute compression fracture of L2 and approximately 35% height loss centrally, slightly increased  compared to prior CT. 3. Acute compression fracture of L5 with approximately 20% height loss. 4. Chronic compression deformities of L3 and L4 status post kyphoplasty without evidence of acute fracture. 5. Anterolisthesis of L5 on S1 with partial uncovering of the disc, diffuse disc bulge, and mild spinal canal stenosis. Mild right and moderate left foraminal stenosis. Disc bulge contacts the bilateral L5 nerve roots. 6. Transitional anatomy with a partially lumbarized S1 vertebra and rudimentary disc at the S1-2 level. Electronically signed by: Donnice Mania MD 05/17/2024 02:31 PM EDT RP Workstation: [id number]   MR THORACIC SPINE WO CONTRAST Result Date: 05/17/2024 EXAM: MR Thoracic Spine without 05/17/2024 12:46:57 PM TECHNIQUE: Multiplanar multisequence MRI of the thoracic spine was performed without the administration of intravenous contrast. COMPARISON: CT thoracic spine 05/12/2024 and same day MRI lumbar spine CLINICAL HISTORY: Compression fracture, thoracic. ? Compression fx //anesthesia FINDINGS: BONES AND ALIGNMENT: Radium insertion fracture of T8 with approximately 25% height loss anteriorly. There is an additional compression fracture of T11 with irregularity of the superior endplate and up to  15% height loss. Compression fracture of T12 with irregularity of the superior endplate and up to 10% height loss centrally. Edema is noted along the T8, T11, and T12 superior endplates. Vertebral body heights are otherwise maintained. Thoracic kyphosis is maintained. There is minimal retropulsion at the superior endplates of T8, T11, and T12. No suspicious osseous lesion. Opacities in the left lung are better evaluated on recent CT chest. SPINAL CORD: There is no high-grade spinal canal stenosis in the thoracic spine. There is a shallow disc bulge at T9-10 which indents the ventrothecal sac without contacting the spinal cord. Additional shallow disc bulge at T11 along with retropulsion which indents the ventral thecal sac. Disc bulge at T11-12 and retropulsion also indents the ventral thecal sac. There is no spinal cord compression. SOFT TISSUES: The paraspinal soft tissues are unremarkable. DEGENERATIVE CHANGES: Disc bulge at T9-10, T11, and T11-12 with associated retropulsion indenting the ventral thecal sac. No high-grade foraminal stenosis. Vasarthrosis at multiple levels. IMPRESSION: 1. Acute compression fracture of T8 (approximately 25% height loss anteriorly) and acute on chronic compression fractures of T11 (up to 15% height loss), and T12 (up to 10% height loss centrally) with associated edema and minimal retropulsion. 2. Degenerative changes as above. No high-grade spinal canal or foraminal stenosis. Electronically signed by: Donnice Mania MD 05/17/2024 02:03 PM EDT RP Workstation: [id number]       LOS: 5 days   Joette Pebbles  [facility] Pager on [email or web address]  05/18/2024, 9:11 AM

## 2024-05-18 NOTE — Plan of Care (Signed)

## 2024-05-18 NOTE — Progress Notes (Signed)
 Physical Therapy Treatment Patient Details Name: Tracy Park MRN: [id number] DOB: [birth year] Today's Date: 05/18/2024   History of Present Illness Tracy Park is a 68 y.o. female admitted with  T8, T11, T12, L1, L2, L5 compression fxs. Pt with medical history significant of COPD, chronic hypoxemic respiratory failure, adenocarcinoma of the lung status post left upper lobectomy, paroxysmal A-fib, hypothyroidism, anxiety, PTSD, who had recent hospitalization in 7/25 for an L3 compression fracture.    PT Comments  Pt transferred bed to recliner with RW, SpO2 92% on 3L O2 with activity, 3/4 dyspnea with activity. Pt performed seated BUE/LE strengthening exercises, tolerance limited by dyspnea. Noted pt is not a candidate for kyphoplasty.     If plan is discharge home, recommend the following: A little help with walking and/or transfers;A little help with bathing/dressing/bathroom;Assistance with cooking/housework;Assist for transportation;Help with stairs or ramp for entrance   Can travel by private vehicle     Yes  Equipment Recommendations  None recommended by PT    Recommendations for Other Services       Precautions / Restrictions Precautions Precautions: Fall;Back Recall of Precautions/Restrictions: Intact Precaution/Restrictions Comments: pt recalls back precautions from recent L3 fx; pt has lumbar corset from recent L3 fx; pt denies falls in past 6 months     Mobility  Bed Mobility Overal bed mobility: Modified Independent Bed Mobility: Rolling, Sidelying to Sit Rolling: Used rails              Transfers   Equipment used: Rolling walker (2 wheels) Transfers: Sit to/from Stand, Bed to chair/wheelchair/BSC Sit to Stand: Contact guard assist   Step pivot transfers: Contact guard assist       General transfer comment: VCs hand placement, CGA for safety; SpO2 92% on 3L O2 with SPT, 3/4 dyspnea, VCs for PLB    Ambulation/Gait               General  Gait Details: deferred 2* Pain   Stairs             Wheelchair Mobility     Tilt Bed    Modified Rankin (Stroke Patients Only)       Balance Overall balance assessment: Needs assistance Sitting-balance support: Feet supported, No upper extremity supported Sitting balance-Leahy Scale: Good     Standing balance support: During functional activity, Bilateral upper extremity supported, Reliant on assistive device for balance Standing balance-Leahy Scale: Fair                              Hotel manager: No apparent difficulties  Cognition Arousal: Alert Behavior During Therapy: WFL for tasks assessed/performed   PT - Cognitive impairments: No apparent impairments                         Following commands: Impaired Following commands impaired: Follows one step commands with increased time    Cueing    Exercises General Exercises - Upper Extremity Shoulder Flexion: AROM, 10 reps, Seated General Exercises - Lower Extremity Long Arc Quad: AROM, Both, 10 reps, Seated Hip Flexion/Marching: AROM, Left, 10 reps, Seated, Limitations Hip Flexion/Marching Limitations: RLE painful so deferred    General Comments        Pertinent Vitals/Pain Pain Assessment Pain Score: 10-Worst pain ever Pain Location: low back Pain Descriptors / Indicators: Grimacing, Guarding Pain Intervention(s): Limited activity within patient's tolerance, Monitored during session, Patient requesting pain meds-RN  notified    Home Living                          Prior Function            PT Goals (current goals can now be found in the care plan section) Acute Rehab PT Goals Patient Stated Goal: decrease pain, go home PT Goal Formulation: With patient Time For Goal Achievement: 05/28/24 Potential to Achieve Goals: Fair Progress towards PT goals: Progressing toward goals    Frequency    Min 3X/week      PT Plan       Co-evaluation   Reason for Co-Treatment: To address functional/ADL transfers;For patient/therapist safety   OT goals addressed during session: ADL's and self-care      AM-PAC PT 6 Clicks Mobility   Outcome Measure  Help needed turning from your back to your side while in a flat bed without using bedrails?: A Little Help needed moving from lying on your back to sitting on the side of a flat bed without using bedrails?: A Little Help needed moving to and from a bed to a chair (including a wheelchair)?: A Little Help needed standing up from a chair using your arms (e.g., wheelchair or bedside chair)?: A Little Help needed to walk in hospital room?: A Little Help needed climbing 3-5 steps with a railing? : A Lot 6 Click Score: 17    End of Session Equipment Utilized During Treatment: Gait belt;Oxygen  Activity Tolerance: Patient limited by pain Patient left: in chair;with call bell/phone within reach;with chair alarm set Nurse Communication: Mobility status;Patient requests pain meds PT Visit Diagnosis: Difficulty in walking, not elsewhere classified (R26.2);Pain     Time: 8656-8640 PT Time Calculation (min) (ACUTE ONLY): 16 min  Charges:    $Therapeutic Activity: 8-22 mins PT General Charges $$ ACUTE PT VISIT: 1 Visit                    Sylvan Delon Copp PT 05/18/2024  Acute Rehabilitation Services  Office [phone]

## 2024-05-18 NOTE — Progress Notes (Signed)
 Patient ID: Tracy Park, female   DOB: Mar 13, 1956, 68 y.o.   MRN: [id number] Asked by primary team to eval pt for possible KP. Latest imaging studies were reviewed by Dr. Karalee. Per his review, pt has 6 acute fractures - too many to KP. She needs maximal conservative therapy as well as anabolic treatment for her underlying osteoporosis. Dr.McCullough can be reached on pager [phone] with any additional questions.

## 2024-05-18 NOTE — Plan of Care (Signed)
  Problem: Health Behavior/Discharge Planning: Goal: Ability to manage health-related needs will improve Outcome: Progressing   Problem: Activity: Goal: Risk for activity intolerance will decrease Outcome: Progressing   Problem: Nutrition: Goal: Adequate nutrition will be maintained Outcome: Progressing   Problem: Elimination: Goal: Will not experience complications related to urinary retention Outcome: Progressing   Problem: Pain Managment: Goal: General experience of comfort will improve and/or be controlled Outcome: Progressing   Problem: Skin Integrity: Goal: Risk for impaired skin integrity will decrease Outcome: Progressing

## 2024-05-18 NOTE — Progress Notes (Signed)
 Daily Progress Note   Patient Name: Tracy Park       Date: 05/18/2024 DOB: 01-Mar-1956  Age: 68 y.o. MRN#: [id number] Attending Physician: Verdene Purchase, MD Primary Care Physician: Corlis Longs, FNP Admit Date: 05/12/2024 Length of Stay: 5 days  Reason for Consultation/Follow-up: Establishing goals of care  Subjective:   CC: Notes back pain improved with pain medications.  Following up regarding complex medical decision making.  Subjective:  Reviewed EMR including recent documentation from hospitalist and IR.  Patient not a candidate for kyphoplasty due to having 6 acute fractures in setting of newly diagnosed osteoporosis. At time of EMR review in the past 24 hours, patient has received IV dilaudid  via PCA 0.5mg  bolus x 6 doses.   Presented to bedside to see patient. Patient laying comfortably in bed. No visitors at bedside. Introduced myself as a member of the palliative medicine team. Patient remember this provider from prior admission. Spent time discussing medical updates regarding patient's care. Discussed patient's multiple hospitalizations and reported lack of support at home. Patient notes that when she tried to get hospice support at home after one discharge, though she couldn't even stay at home long enough for it really to get set up before she had to go back into the hospital. Patient's notes she has tried rehab since that time and reports it did not go well. Patient notes she does not know the best way forward since she doesn't feel she has the support at home even with hospice support. Noted would involve TOC to assist with discharge coordination. Not sure if patient has insurance support for possible long term care with our without hospice.  During conversation patient's daughter did call patient quickly.  Discussed how patient is not a candidate for kyphoplasty due to multiple acute fractures.  Discussed patient's new diagnosis of osteoporosis.  Conversation was short as  patient noted she would call daughter back later to update her.  After phone call completed, patient voiced frustration about lack of support she has here from her daughter.  Patient does actually have 2 daughters though her other daughter has been in and out of jail and she is not sure where she is even at at this time.  Spent time providing emotional support via active listening.  Discussed symptom management as well.  Patient has not required frequent dosing from PCA.  Noted would discontinue PCA to get patient on oral pain medications.  Patient has been on oxycodone  previously.  Noted with start patient on oxycodone  again and could adjust dose based on symptom burden.  Will add as needed IV Dilaudid  for breakthrough pain.  Patient agreement with this plan.  Spent time answering questions as able.  Noted palliative medicine team to continue to follow patient's medical journey.  Discussed care with hospitalist, TOC, and RN to coordinate care.  Objective:   Vital Signs:  BP (!) 141/83 (BP Location: Right Arm)   Pulse 82   Temp 97.9 F (36.6 C)   Resp 20   Ht 5' 3 (1.6 m)   Wt 53.5 kg   SpO2 100%   BMI 20.90 kg/m   Physical Exam: General: NAD, alert, chronically ill-appearing, frail Cardiovascular: RRR Respiratory: no increased work of breathing noted, not in respiratory distress Abdomen: not distended Neuro: A&Ox4, following commands easily Psych: appropriately answers all questions   Assessment & Plan:   Assessment: Patient is a 68 year old female with a past medical history of COPD, chronic hypoxic respiratory failure, adenocarcinoma of the lung  status post left upper lobectomy, paroxysmal A-fib, hypothyroidism, anxiety, and PTSD who was admitted on 05/12/2024 for management of back pain.  Upon review of further imaging, has been determined patient has 6 acute fractures in the spine.  Pal medicine team contacted to assist with complex medical decision  making.  Recommendations/Plan: # Complex medical decision making/goals of care:  - Discussed care with patient today as detailed above in HPI.  Patient notes that she does not have any support at home.  Patient has previously attempted to go home with hospice support though this did not even have time for initiation before patient had to be rehospitalized.  Patient notes that her daughter does not assist in caring for her.  Patient notes that she has attempted rehab though that has not gone well.  Difficult social situation with lack of home support.  Have discussed rehab versus long-term care placement with or without hospice.  TOC assisting with coordination of care and discharge planning.  Palliative medicine will continue to engage in conversations as able and appropriate.  -  Code Status: Limited: Do not attempt resuscitation (DNR) -DNR-LIMITED -Do Not Intubate/DNI   # Symptom management: Patient is receiving these palliative interventions for symptom management with an intent to improve quality of life.   - Pain, acute on chronic in the setting of acute compression fractures with new diagnosis of osteoporosis   - Stop IV Dilaudid  PCA   - Start oxycodone  5-10 mg every 4 hours as needed   - Start IV Dilaudid  1 mg every 4 hours as needed breakthrough after oral oxycodone   - Of note, depending on patient's discharge planning, may need chronic pain management provider. Agree with use of scheduled senna 2 tab twice daily while receiving opioids.  # Psychosocial Support:  -[employer]aughter[employer] broker  # Discharge Planning: To Be Determined  Discussed with: Patient, hospitalist, TOC, RN  Thank you for allowing the palliative care team to participate in the care Tracy Park.  Tinnie Radar, DO Palliative Care Provider PMT # [phone]  If patient remains symptomatic despite maximum doses, please call PMT at [phone] between 0700 and 1900. Outside of these hours, please call attending, as  PMT does not have night coverage.  Billing based on MDM: High  Problems Addressed: One or more chronic illnesses with severe exacerbation, progression, or side effects of treatment.  Risks: Parenteral controlled substances

## 2024-05-18 NOTE — TOC Progression Note (Signed)
 Transition of Care [facility]) - Progression Note    Patient Details  Name: Tracy Park MRN: [id number] Date of Birth: 05/29/1956  Transition [facility]) CM/SW Contact  Heather DELENA Saltness, LCSW Phone Number: 05/18/2024, 1:14 PM  Clinical Narrative:    CSW met with pt at bedside to discuss discharge planning. Pt reports she currently resides with daughter, who is per POA, but not assist pt at home. Pt reports she is active with [facility] for outpatient palliative care services. Pt reports her long-term plan is to move to [place]  to be with family. Pt reports she was at [facility] for SNF rehab last month. CSW advised pt that PT recommended pt for short-term SNF rehab again. Pt agreeable to seeking SNF placement. Pt would prefer to return to [facility], but is also open to CSW sending referrals to other SNF locations. CSW completed FL2 and sent referrals to SNF. TOC will continue to follow.   Expected Discharge Plan: Home w Home Health Services Barriers to Discharge: Continued Medical Work up   Expected Discharge Plan and Services In-house Referral: NA Discharge Planning Services: CM Consult Post Acute Care Choice: NA Living arrangements for the past 2 months: Mobile Home                 DME Arranged: N/A DME Agency: NA       HH Arranged: NA HH Agency: NA         Social Drivers of Health (SDOH) Interventions SDOH Screenings   Food Insecurity: No Food Insecurity (05/12/2024)  Recent Concern: Food Insecurity - Food Insecurity Present (03/03/2024)  Housing: Low Risk  (05/12/2024)  Transportation Needs: No Transportation Needs (05/12/2024)  Utilities: Not At Risk (05/12/2024)  Financial Resource Strain: High Risk (10/08/2023)   Received from [facility]  Physical Activity: Unknown (04/07/2023)   [facility]  Social Connections: Moderately Isolated (05/12/2024)  Stress: Stress Concern Present (04/07/2023)   Received from [facility]  Tobacco Use: High Risk  (05/17/2024)    Readmission Risk Interventions    05/18/2024    1:13 PM 03/13/2024    3:54 PM 03/04/2024    2:47 PM  Readmission Risk Prevention Plan  Transportation Screening Complete Complete Complete  Medication Review Oceanographer) Complete Complete Complete  PCP or Specialist appointment within 3-5 days of discharge Complete Complete Complete  HRI or Home Care Consult Complete Complete Complete  SW Recovery Care/Counseling Consult Complete Complete Complete  Palliative Care Screening Complete Complete Not Applicable  Skilled Nursing Facility Complete Not Applicable Not Applicable    Signed: Heather Saltness, MSW, LCSW Clinical Social Worker Inpatient Care Management 05/18/2024 1:19 PM

## 2024-05-18 NOTE — [place] FL2 (Signed)
 [place]  MEDICAID FL2 LEVEL OF CARE FORM     IDENTIFICATION  Patient Name: Tracy Park Birthdate: [birth year] Sex: female Admission Date (Current Location): 05/12/2024  [facility] and [place] Number:  Producer, television/film/video and Address:         Provider Number: [id number]  Attending Physician Name and Address:  Verdene Purchase, MD  Relative Name and Phone Number:  Cleotilde Hough (Daughter/POA)  [phone]    Current Level of Care: Hospital Recommended Level of Care: Skilled Nursing Facility Prior Approval Number:    Date Approved/Denied:   PASRR Number: [id number]  Discharge Plan: SNF    Current Diagnoses: Patient Active Problem List   Diagnosis Date Noted   Pain 05/18/2024   High risk medication use 05/18/2024   Medication management 05/18/2024   Closed fracture of eighth thoracic vertebra (HCC) 05/17/2024   Compression fracture of T8 vertebra (HCC) 05/12/2024   Closed compression fracture of L3 vertebra (HCC) 03/10/2024   Compression fracture of lumbar vertebra (HCC) 03/10/2024   Closed compression fracture of L5 lumbar vertebra, initial encounter (HCC) 03/10/2024   Hypokalemia 02/06/2024   Factitious disorder with predominantly psychological signs and symptoms 01/30/2024   Palliative care by specialist 01/30/2024   Need for emotional support 01/30/2024   Counseling and coordination of care 01/30/2024   Goals of care, counseling/discussion 01/30/2024   COPD exacerbation (HCC) 01/30/2024   Acute on chronic respiratory failure with hypoxia and hypercapnia (HCC) 12/13/2023   COPD with acute exacerbation 09/22/2023   Paroxysmal atrial fibrillation (HCC) 02/23/2023   Pulmonary nodule 01/25/2023   COPD (chronic obstructive pulmonary disease) (HCC) 01/25/2023   Essential hypertension 01/25/2023   DDD (degenerative disc disease), cervical 03/24/2022   Bipolar disorder (HCC) 03/24/2022   Dyslipidemia 03/13/2022   Anxiety and depression 03/13/2022   History of  lung cancer 03/13/2022   Chronic respiratory failure with hypoxia and hypercapnia (HCC) 02/26/2022   Normocytic anemia 02/08/2022   Malnutrition of moderate degree 08/25/2021   Coronary artery disease involving native coronary artery of native heart without angina pectoris 08/18/2018   Chronic bilateral low back pain without sciatica 08/21/2016   Obstructive sleep apnea (adult) (pediatric) 02/22/2015   Hyperlipidemia 06/03/2013   Vitamin D  deficiency 06/03/2013   Hypothyroidism 04/28/2012   Tobacco use 04/28/2012    Orientation RESPIRATION BLADDER Height & Weight     Self, Time, Situation, Place  O2 (3L via Nasal Cannula) Incontinent Weight: 118 lb (53.5 kg) Height:  5' 3 (160 cm)  BEHAVIORAL SYMPTOMS/MOOD NEUROLOGICAL BOWEL NUTRITION STATUS      Continent Diet (Low sodium/Heart healthy)  AMBULATORY STATUS COMMUNICATION OF NEEDS Skin   Limited Assist Verbally Normal                       Personal Care Assistance Level of Assistance  Bathing, Feeding, Dressing Bathing Assistance: Limited assistance Feeding assistance: Independent Dressing Assistance: Limited assistance     Functional Limitations Info  Sight, Hearing, Speech Sight Info: Impaired (eyeglasses) Hearing Info: Adequate Speech Info: Adequate    SPECIAL CARE FACTORS FREQUENCY  PT (By licensed PT), OT (By licensed OT)     PT Frequency: 5x per week OT Frequency: 5x per week            Contractures Contractures Info: Not present    Additional Factors Info  Code Status, Allergies Code Status Info: DNR Allergies Info: Red Dye #40 (Allura Red), Strawberry Extract, Tomato, Aspirin, Tape, Wound Dressing Adhesive  Current Medications (05/18/2024):  This is the current hospital active medication list Current Facility-Administered Medications  Medication Dose Route Frequency Provider Last Rate Last Admin   acetaminophen  (TYLENOL ) tablet 650 mg  650 mg Oral Q6H PRN Arshad, Mohsin A, MD       Or    acetaminophen  (TYLENOL ) suppository 650 mg  650 mg Rectal Q6H PRN Arshad, Mohsin A, MD       albuterol  (PROVENTIL ) (2.5 MG/3ML) 0.083% nebulizer solution 3 mL  3 mL Inhalation Q6H PRN Arshad, Mohsin A, MD   3 mL at 05/15/24 1952   benzonatate  (TESSALON ) capsule 100 mg  100 mg Oral TID PRN Arshad, Mohsin A, MD   100 mg at 05/18/24 0256   bisacodyl  (DULCOLAX) suppository 10 mg  10 mg Rectal Daily PRN Krishnan, Gokul, MD       budesonide -glycopyrrolate -formoterol  (BREZTRI ) 160-9-4.8 MCG/ACT inhaler 2 puff  2 puff Inhalation BID Arshad, Mohsin A, MD   2 puff at 05/18/24 0825   doxycycline  (VIBRA -TABS) tablet 100 mg  100 mg Oral Q12H Arshad, Mohsin A, MD   100 mg at 05/18/24 1021   enoxaparin  (LOVENOX ) injection 40 mg  40 mg Subcutaneous q1800 Krishnan, Gokul, MD   40 mg at 05/17/24 1833   feeding supplement (ENSURE PLUS HIGH PROTEIN) liquid 237 mL  237 mL Oral TID BM Arshad, Mohsin A, MD   237 mL at 05/18/24 1026   guaiFENesin  (MUCINEX ) 12 hr tablet 600 mg  600 mg Oral BID Sheikh, Omair Latif, DO   600 mg at 05/18/24 1020   HYDROmorphone  (DILAUDID ) injection 1 mg  1 mg Intravenous Once Anwar, Zeba, MD       HYDROmorphone  (DILAUDID ) injection 1 mg  1 mg Intravenous Q4H PRN Mims, Lauren W, DO       ipratropium-albuterol  (DUONEB) 0.5-2.5 (3) MG/3ML nebulizer solution 3 mL  3 mL Nebulization BID Arshad, Mohsin A, MD   3 mL at 05/18/24 0824   levothyroxine  (SYNTHROID ) tablet 125 mcg  125 mcg Oral Q0600 Arshad, Mohsin A, MD   125 mcg at 05/18/24 0533   LORazepam  (ATIVAN ) injection 1 mg  1 mg Intravenous Once Anwar, Zeba, MD       metoprolol  succinate (TOPROL -XL) 24 hr tablet 12.5 mg  12.5 mg Oral Daily Arshad, Mohsin A, MD   12.5 mg at 05/18/24 1020   naloxone  (NARCAN ) injection 0.4 mg  0.4 mg Intravenous PRN Anwar, Zeba, MD       And   sodium chloride  flush (NS) 0.9 % injection 9 mL  9 mL Intravenous PRN Anwar, Zeba, MD       nicotine  (NICODERM CQ  - dosed in mg/24 hours) patch 14 mg  14 mg Transdermal  Daily Arshad, Mohsin A, MD   14 mg at 05/14/24 0953   ondansetron  (ZOFRAN ) injection 4 mg  4 mg Intravenous Q6H PRN Anwar, Zeba, MD   4 mg at 05/18/24 0256   ondansetron  (ZOFRAN ) tablet 4 mg  4 mg Oral Q6H PRN Arshad, Mohsin A, MD       oxyCODONE  (Oxy IR/ROXICODONE ) immediate release tablet 5-10 mg  5-10 mg Oral Q4H PRN Mims, Lauren W, DO       polyethylene glycol (MIRALAX  / GLYCOLAX ) packet 17 g  17 g Oral BID Krishnan, Gokul, MD   17 g at 05/18/24 1025   prednisoLONE  acetate (PRED FORTE ) 1 % ophthalmic suspension 1 drop  1 drop Left Eye BID Arshad, Mohsin A, MD   1 drop at 05/18/24 1025  prochlorperazine  (COMPAZINE ) injection 10 mg  10 mg Intravenous Q6H PRN Sheikh, Omair Latif, DO   10 mg at 05/16/24 0901   rosuvastatin  (CRESTOR ) tablet 20 mg  20 mg Oral Daily Arshad, Mohsin A, MD   20 mg at 05/18/24 1020   senna (SENOKOT) tablet 17.2 mg  2 tablet Oral BID Krishnan, Gokul, MD   17.2 mg at 05/18/24 1020   sertraline  (ZOLOFT ) tablet 25 mg  25 mg Oral Daily Arshad, Mohsin A, MD   25 mg at 05/18/24 1020     Discharge Medications: Please see discharge summary for a list of discharge medications.  Relevant Imaging Results:  Relevant Lab Results:   Additional Information SSN: [id number]  Heather DELENA Saltness, LCSW

## 2024-05-19 DIAGNOSIS — J9611 Chronic respiratory failure with hypoxia: Secondary | ICD-10-CM | POA: Diagnosis not present

## 2024-05-19 DIAGNOSIS — Z515 Encounter for palliative care: Secondary | ICD-10-CM | POA: Diagnosis not present

## 2024-05-19 DIAGNOSIS — I10 Essential (primary) hypertension: Secondary | ICD-10-CM | POA: Diagnosis not present

## 2024-05-19 DIAGNOSIS — S22069A Unspecified fracture of T7-T8 vertebra, initial encounter for closed fracture: Secondary | ICD-10-CM | POA: Diagnosis not present

## 2024-05-19 DIAGNOSIS — I480 Paroxysmal atrial fibrillation: Secondary | ICD-10-CM | POA: Diagnosis not present

## 2024-05-19 DIAGNOSIS — S32050A Wedge compression fracture of fifth lumbar vertebra, initial encounter for closed fracture: Secondary | ICD-10-CM | POA: Diagnosis not present

## 2024-05-19 DIAGNOSIS — S22060A Wedge compression fracture of T7-T8 vertebra, initial encounter for closed fracture: Secondary | ICD-10-CM | POA: Diagnosis not present

## 2024-05-19 DIAGNOSIS — R52 Pain, unspecified: Secondary | ICD-10-CM | POA: Diagnosis not present

## 2024-05-19 LAB — BASIC METABOLIC PANEL WITH GFR
Anion gap: 11 (ref 5–15)
BUN: 19 mg/dL (ref 8–23)
CO2: 29 mmol/L (ref 22–32)
Calcium: 9.7 mg/dL (ref 8.9–10.3)
Chloride: 101 mmol/L (ref 98–111)
Creatinine, Ser: 0.53 mg/dL (ref 0.44–1.00)
GFR, Estimated: >60 mL/min (ref >60)
Glucose, Bld: 140 mg/dL — ABNORMAL HIGH (ref 70–99)
Potassium: 4.2 mmol/L (ref 3.5–5.1)
Sodium: 141 mmol/L (ref 135–145)

## 2024-05-19 MED ORDER — ALENDRONATE SODIUM 70 MG PO TABS: 70.0000 mg | ORAL_TABLET | ORAL | Status: AC

## 2024-05-19 MED ORDER — OXYCODONE HCL 5 MG PO TABS
15.0000 mg | ORAL_TABLET | ORAL | Status: DC | PRN
  Administered 2024-05-19: 15 mg via ORAL
  Administered 2024-05-19 – 2024-05-20 (×2): 20 mg via ORAL
  Administered 2024-05-21: 15 mg via ORAL
  Filled 2024-05-19 (×2): qty 4
  Filled 2024-05-19 (×2): qty 3

## 2024-05-19 MED ORDER — APIXABAN 5 MG PO TABS
5.0000 mg | ORAL_TABLET | Freq: Two times a day (BID) | ORAL | Status: DC
  Administered 2024-05-19 – 2024-05-21 (×5): 5 mg via ORAL
  Filled 2024-05-19 (×5): qty 1

## 2024-05-19 MED ORDER — LIDOCAINE 5 % EX PTCH
3.0000 | MEDICATED_PATCH | CUTANEOUS | Status: DC
  Administered 2024-05-20: 3 via TRANSDERMAL
  Filled 2024-05-19 (×2): qty 3

## 2024-05-19 MED ORDER — OYSTER SHELL CALCIUM/D3 500-5 MG-MCG PO TABS
2.0000 | ORAL_TABLET | Freq: Two times a day (BID) | ORAL | Status: DC
  Administered 2024-05-19 – 2024-05-21 (×5): 2 via ORAL
  Filled 2024-05-19 (×5): qty 2

## 2024-05-19 MED ORDER — OXYCODONE HCL ER 10 MG PO T12A
10.0000 mg | EXTENDED_RELEASE_TABLET | Freq: Two times a day (BID) | ORAL | Status: DC
  Administered 2024-05-19 (×2): 10 mg via ORAL
  Filled 2024-05-19 (×2): qty 1

## 2024-05-19 MED ORDER — METHOCARBAMOL 500 MG PO TABS
500.0000 mg | ORAL_TABLET | Freq: Four times a day (QID) | ORAL | Status: DC | PRN
  Administered 2024-05-19: 500 mg via ORAL
  Filled 2024-05-19: qty 1

## 2024-05-19 MED ORDER — BISACODYL 10 MG RE SUPP
10.0000 mg | Freq: Once | RECTAL | Status: AC
  Administered 2024-05-19: 10 mg via RECTAL
  Filled 2024-05-19: qty 1

## 2024-05-19 NOTE — TOC Progression Note (Signed)
 Transition of Care [facility]) - Progression Note    Patient Details  Name: Tracy Park MRN: [id number] Date of Birth: [birth year]  Transition of Care [facility]) CM/SW Contact  Doneta Glenys DASEN, RN Phone Number: 05/19/2024, 8:22 AM  Clinical Narrative:    Choice list given. 1515 - [name]              Service Provider Request Status STAR(s) Address Phone Patient Preferred  [facility] Preferred SNF  Accepted 5 [address], [place] [address] [phone]   [facility] Preferred SNF  Accepted 2 [address] [place] [address] [phone]   [facility] SN[facility]  Accepted 2 [address], [place] [address] [phone]   [facility] SNF  Accepted 2 [address] [place], [place] [address] [phone]   [facility] SNF  Accepted 1 [address], [place] [address] [phone]   [facility] SNF  Accepted 2 [address] [place] [address] [phone]      Expected Discharge Plan: Home w Home Health Services Barriers to Discharge: Continued Medical Work up               Expected Discharge Plan and Services In-house Referral: NA Discharge Planning Services: CM Consult Post Acute Care Choice: NA Living arrangements for the past 2 months: Mobile Home                 DME Arranged: N/A DME Agency: NA       HH Arranged: NA HH Agency: NA         Social Drivers of Health (SDOH) Interventions SDOH Screenings   Food Insecurity: No Food Insecurity (05/12/2024)  Recent Concern: Food Insecurity - Food Insecurity Present (03/03/2024)  Housing: Low Risk  (05/12/2024)  Transportation Needs: No Transportation Needs (05/12/2024)  Utilities: Not At Risk (05/12/2024)  Financial Resource Strain: High Risk (10/08/2023)   Received from [facility]  Physical Activity: Unknown (04/07/2023)   Received from [facility]  Social Connections: Moderately  Isolated (05/12/2024)  Stress: Stress Concern Present (04/07/2023)   Received from [facility]  Tobacco Use: High Risk (05/17/2024)    Readmission Risk Interventions    05/18/2024    1:13 PM 03/13/2024    3:54 PM 03/04/2024    2:47 PM  Readmission Risk Prevention Plan  Transportation Screening Complete Complete Complete  Medication Review Oceanographer) Complete Complete Complete  PCP or Specialist appointment within 3-5 days of discharge Complete Complete Complete  HRI or Home Care Consult Complete Complete Complete  SW Recovery Care/Counseling Consult Complete Complete Complete  Palliative Care Screening Complete Complete Not Applicable  Skilled Nursing Facility Complete Not Applicable Not Applicable

## 2024-05-19 NOTE — Progress Notes (Signed)
   05/19/24 0900  Spiritual Encounters  Type of Visit Initial  Care provided to: Patient  Reason for visit Routine spiritual support  OnCall Visit No  Spiritual Framework  Presenting Themes Meaning/purpose/sources of inspiration;Values and beliefs;Impactful experiences and emotions;Significant life change;Community and relationships  Community/Connection Family  Interventions  Spiritual Care Interventions Made Established relationship of care and support;Compassionate presence;Reflective listening;Normalization of emotions;Reconciliation with self/others;Narrative/life review;Explored values/beliefs/practices/strengths;Meaning making;Prayer;Encouragement    Chaplain responded to spiritual care consult. Pt Tracy Park and I explored her emotions regarding the bad news she received yesterday (i.e., that she is not a good candidate for back surgery). She also shared a life review and talked about her two daughters and the rest of her family. Ultimately, she trusts that God is in control of everything and intends to focus on her health and recovery to the best of her ability at this time in her life.

## 2024-05-19 NOTE — Progress Notes (Signed)
 Daily Progress Note   Patient Name: CANDIDA VETTER       Date: 05/19/2024 DOB: 03/02/1956  Age: 68 y.o. MRN#: [id number] Attending Physician: Verdene Purchase, MD Primary Care Physician: Corlis Longs, FNP Admit Date: 05/12/2024 Length of Stay: 6 days  Reason for Consultation/Follow-up: Establishing goals of care  Subjective:   CC: Notes oxycodone  helps with pain though not enough.  Following up regarding complex medical decision making.  Subjective:  Reviewed EMR including recent documentation from hospitalist and TOC. At time of EMR reviewed past 24 hours patient has received as needed oxycodone  10 mg x 4 doses and as needed IV Dilaudid  1 mg x 2 doses. Has been determined patient will discharge to [facility] likely 05/21/2024 for rehab.  Presented to bedside to see patient.  Patient laying in bed.  Discussed patient's symptom management at this time.  Patient feels the oxycodone  helps slightly with her pain though not enough.  Patient denies any adverse effects such as lethargy or confusion associated with as needed oxycodone .  Noted would increase dose appropriately and can further adjust based on patient's symptom response.  Patient agreement with this plan.  Patient planning to go to [facility] for rehab. Spent time answering questions as able at that time.  Noted palliative medicine team continue to follow patient's medical journey.  Discussed care with hospitalist, RN, and TOC to coordinate.  Since patient planning to go to rehab, patient will likely need chronic pain management provider once discharged so has appropriate follow-up for management of opioids.  Hospitalist and TOC assisting with this.  Objective:   Vital Signs:  BP 135/79 (BP Location: Right Arm)   Pulse 91   Temp 98.4 F (36.9 C)   Resp 17   Ht 5' 3 (1.6 m)   Wt 53.5 kg   SpO2 98%   BMI 20.90 kg/m   Physical Exam: General: NAD, alert, chronically ill-appearing, frail Cardiovascular: RRR Respiratory:  no increased work of breathing noted, not in respiratory distress Abdomen: not distended Neuro: A&Ox4, following commands easily Psych: appropriately answers all questions   Assessment & Plan:   Assessment: Patient is a 68 year old female with a past medical history of COPD, chronic hypoxic respiratory failure, adenocarcinoma of the lung status post left upper lobectomy, paroxysmal A-fib, hypothyroidism, anxiety, and PTSD who was admitted on 05/12/2024 for management of back pain.  Upon review of further imaging, has been determined patient has 6 acute fractures in the spine.  Pal medicine team contacted to assist with complex medical decision making.  Recommendations/Plan: # Complex medical decision making/goals of care:  - Discussed care with patient today as detailed above in HPI.  Patient notes that she does not have any support at home.  Patient has previously attempted to go home with hospice support though this did not even have time for initiation before patient had to be rehospitalized.  Patient notes that her daughter does not assist in caring for her.  Patient now agreeing to go to rehab at [facility] upon discharge.  TOC assisting with coordination of care and discharge planning.  Palliative medicine will continue to engage in conversations as able and appropriate.  -  Code Status: Limited: Do not attempt resuscitation (DNR) -DNR-LIMITED -Do Not Intubate/DNI   # Symptom management: Patient is receiving these palliative interventions for symptom management with an intent to improve quality of life.   - Pain, acute on chronic in the setting of acute compression fractures with new diagnosis of osteoporosis   -  Increase oxycodone  to 15-20 mg every 4 hours as needed   - Continue IV Dilaudid  1 mg every 4 hours as needed breakthrough after oral oxycodone    - Hospitalist starting on OxyContin  20 mg every 12 hours.  Discussed patient will need chronic pain management provider to continue  opioids in the outpatient setting.   - Constipation   - Agree with use of suppository today.   -Continue senna 2 tabs twice daily scheduled.  Can increase up to 4 tabs twice daily if needed.   - Receiving MiraLAX  17 g twice daily.  # Psychosocial Support:  -[employer] broker  # Discharge Planning: Skilled nursing facility for rehab  Discussed with: Patient, hospitalist, [facility], RN  Thank you for allowing the palliative care team to participate in the care Tranisha L Reasons.  Tinnie Radar, DO Palliative Care Provider PMT # [phone]  If patient remains symptomatic despite maximum doses, please call PMT at [phone] between 0700 and 1900. Outside of these hours, please call attending, as PMT does not have night coverage.

## 2024-05-19 NOTE — Progress Notes (Signed)
 Occupational Therapy Treatment Patient Details Name: Tracy Park MRN: [id number] DOB: 1955-09-10 Today's Date: 05/19/2024   History of present illness Tracy Park is a 68 y.o. female admitted with  T8, T11, T12, L1, L2, L5 compression fxs. Pt with medical history significant of COPD, chronic hypoxemic respiratory failure, adenocarcinoma of the lung status post left upper lobectomy, paroxysmal A-fib, hypothyroidism, anxiety, PTSD, who had recent hospitalization in 7/25 for an L3 compression fracture.   OT comments  Patient seen for skilled OT session this afternoon. See below for limited session due to pain. Patient participated in light therex for overall activity tolerance and repositioning.   Patient requires continued Acute care hospital level OT services to progress safety and functional performance and allow for discharge.        If plan is discharge home, recommend the following:  A lot of help with walking and/or transfers;A lot of help with bathing/dressing/bathroom   Equipment Recommendations  None recommended by OT       Precautions / Restrictions Precautions Precautions: Fall;Back Recall of Precautions/Restrictions: Intact Precaution/Restrictions Comments: pt recalls back precautions from recent L3 fx; pt has lumbar corset from recent L3 fx; pt denies falls in past 6 months Required Braces or Orthoses: Spinal Brace Spinal Brace: Lumbar corset Restrictions Weight Bearing Restrictions Per Provider Order: No       Mobility Bed Mobility Overal bed mobility: Needs Assistance Bed Mobility: Sidelying to Sit, Sit to Sidelying Rolling: Used rails Sidelying to sit: HOB elevated, Min assist   Sit to supine: Min assist, Mod assist Sit to sidelying: Min assist, Mod assist General bed mobility comments: cues for log rolling and postural alignment    Transfers Overall transfer level:  (declined OOB to commode or recliner due to pain)                        Balance Overall balance assessment: Needs assistance Sitting-balance support: Feet supported, No upper extremity supported Sitting balance-Leahy Scale: Fair                                     ADL either performed or assessed with clinical judgement   ADL Overall ADL's : Needs assistance/impaired                           Toilet Transfer Details (indicate cue type and reason): patient agreeable initially for commode transfer due to suppository inserted but upon trial revered to laying back down and preferring bed pan use due to pain and poor activity tolerance         Functional mobility during ADLs: Moderate assistance;Minimal assistance General ADL Comments: poor activity tolerance this session with limitations due to pain    Extremity/Trunk Assessment Upper Extremity Assessment Upper Extremity Assessment: Generalized weakness   Lower Extremity Assessment Lower Extremity Assessment: Defer to PT evaluation                 Communication Communication Communication: No apparent difficulties   Cognition Arousal: Alert Behavior During Therapy: WFL for tasks assessed/performed Cognition: History of cognitive impairments             OT - Cognition Comments: STM deficits                 Following commands: Impaired Following commands impaired: Follows one step commands with increased time  Cueing   Cueing Techniques: Verbal cues  Exercises Exercises: Other exercises (grasp ball squeezes and yellow tband tband for triceps press 10 reps x 2 sets)            Pertinent Vitals/ Pain       Pain Assessment Pain Assessment: Faces Faces Pain Scale: Hurts worst Breathing: normal Negative Vocalization: none Facial Expression: smiling or inexpressive Body Language: relaxed Consolability: no need to console PAINAD Score: 0 Pain Location: low back Pain Descriptors / Indicators: Grimacing, Guarding Pain Intervention(s): Limited  activity within patient's tolerance, Monitored during session, Premedicated before session, Repositioned, Heat applied   Frequency  Min 2X/week        Progress Toward Goals  OT Goals(current goals can now be found in the care plan section)  Progress towards OT goals: Progressing toward goals  Acute Rehab OT Goals Patient Stated Goal: to feel less pain OT Goal Formulation: With patient Time For Goal Achievement: 05/28/24 Potential to Achieve Goals: Good ADL Goals Pt Will Perform Lower Body Bathing: with min assist;sit to/from stand;with adaptive equipment Pt Will Perform Lower Body Dressing: with min assist;sit to/from stand;with adaptive equipment Pt Will Transfer to Toilet: with min assist;bedside commode;ambulating Additional ADL Goal #1: bed mobiliy with CGA in preparation for ADl tasks  Plan         AM-PAC OT 6 Clicks Daily Activity     Outcome Measure   Help from another person eating meals?: None Help from another person taking care of personal grooming?: A Little Help from another person toileting, which includes using toliet, bedpan, or urinal?: A Lot Help from another person bathing (including washing, rinsing, drying)?: A Lot Help from another person to put on and taking off regular upper body clothing?: A Little Help from another person to put on and taking off regular lower body clothing?: A Lot 6 Click Score: 16    End of Session Equipment Utilized During Treatment: Oxygen   OT Visit Diagnosis: Unsteadiness on feet (R26.81);Other abnormalities of gait and mobility (R26.89);Muscle weakness (generalized) (M62.81);Pain Pain - part of body:  (back)   Activity Tolerance Patient limited by pain;Other (comment) (n/v and tearfulness, with suppository in place currently)   Patient Left in bed;with call bell/phone within reach;with bed alarm set   Nurse Communication Mobility status        Time: 1150-1210 OT Time Calculation (min): 20 min  Charges: OT  Treatments $Therapeutic Exercise: 8-22 mins  Rett Stehlik OT/L Acute Rehabilitation Department  [phone]  05/19/2024, 5:17 PM

## 2024-05-19 NOTE — Progress Notes (Signed)
 [facility] PROGRESS NOTE   Tracy Park FMW:[id number] DOB: November 08, 1955 DOA: 05/12/2024  PCP: Corlis Longs, FNP  Brief History: 68 y.o. female with medical history significant of COPD, chronic hypoxemic respiratory failure, adenocarcinoma of the lung status post left upper lobectomy, paroxysmal A-fib, hypothyroidism, anxiety, PTSD, who had recent hospitalization in 7/25 for an L3 compression fracture.  She presents with right-sided back now and there is concern for new T8 compression fracture as well as L5 subacute fracture. Radiology was consulted for kyphoplasty on admission and she was recommended to undergo MRI T and L-spine. Due to significant claustrophobia and anxiety, this is to be arranged under anesthesia at [facility].  Consultants: Interventional radiology.  Palliative medicine  Procedures: None yet.   Subjective/Interval History: Complains of 7 out of 10 pain in the back.  Pain worsens with minimal exertion and movement.  Still no bowel movements.     Assessment/Plan:  Acute on Chronic Back Pain due to Compression Fractures:  - Denies any recent trauma or falls - Presents with significant Back Pain. - CT T and L-spine show concern for T8 and L5 compression fractures - After review by interventional radiology, she may undergo further intervention with kyphoplasty to T8, 12 and L1, L5 Patient underwent MRI of the thoracic and lumbar spine under general anesthesia on 9/8. Multiple compression fractures noted including T8, T11, T12, L1, L2, L5. Informed by IR that patient has too many compression fractures and not amenable to kyphoplasty.  Will need symptomatic management.   Patient has never been officially diagnosed with osteoporosis but has had steroids previously for her lung disease.  She likely does have osteoporosis considering her multiple compression fractures.  Vitamin D  level is low normal.  Started her on calcium  and vitamin D .  Will also benefit from  bisphosphonate at discharge.  No contraindications noted. She has been transitioned from PCA pump to oral pain medicines by palliative care.  Will need adjustment today.   She will likely need short-term rehab at skilled nursing facility once her pain is reasonably well-controlled.   COPD Chronic hypoxemia with hypercapnia History of lung adenocarcinoma s/p LUL lobectomy - Currently on doxycycline  course for recent COPD exacerbation prior to admission Respiratory status has been stable.  Will discontinue antibiotics.  Constipation Has been constipated for several days.  She was started on scheduled MiraLAX  and Senokot.  No improvement noted.  Will order suppository.  May need to consider Movantik . Constipation is most likely multifactorial including acute pain issues along with narcotic use.   Leukocytosis Resolved   Paroxysmal A-fib:  Apixaban  had been on hold for procedure.  Now that there is no plan for kyphoplasty we will resume her apixaban .  Continue metoprolol .     Hypothyroidism:  Checked TSH and was 6.430. Free T4 was 1.05. Continue Levothyroxine  125 mcg po Daily and repeat TFTs in 4-6 weeks.   Hyperlipidemia:  C/w Rosuvastatin  20 mg po Daily    Essential HTN:  Continue with metoprolol .  Losartan  is on hold.  Blood pressure is reasonably well-controlled.    Anxiety and Depression and PTSD:  Continue w/ Sertraline  25 mg po Daily   Moderate Protein Calorie Malnutrition C/w Ensure Plus High Protein 237.  Etiology: chronic illness Signs/Symptoms: moderate fat depletion, severe muscle depletion Interventions: Ensure Enlive (each supplement provides 350kcal and 20 grams of protein)   Tobacco Abuse: Smoking Cessation Counseling given. C/w Nicotine  14 mg TD q24h  DVT Prophylaxis: Lovenox  Code Status: DNR Family Communication: Discussed with  patient.  No family at bedside Disposition Plan: SNF is being pursued     Medications: Scheduled:   budesonide -glycopyrrolate -formoterol   2 puff Inhalation BID   doxycycline   100 mg Oral Q12H   enoxaparin  (LOVENOX ) injection  40 mg Subcutaneous q1800   feeding supplement  237 mL Oral TID BM   guaiFENesin   600 mg Oral BID    HYDROmorphone  (DILAUDID ) injection  1 mg Intravenous Once   ipratropium-albuterol   3 mL Nebulization BID   levothyroxine   125 mcg Oral Q0600   lidocaine   1 patch Transdermal Once   LORazepam   1 mg Intravenous Once   metoprolol  succinate  12.5 mg Oral Daily   nicotine   14 mg Transdermal Daily   polyethylene glycol  17 g Oral BID   prednisoLONE  acetate  1 drop Left Eye BID   rosuvastatin   20 mg Oral Daily   senna  2 tablet Oral BID   sertraline   25 mg Oral Daily   Continuous:   PRN:acetaminophen  **OR** acetaminophen , albuterol , benzonatate , bisacodyl , HYDROmorphone  (DILAUDID ) injection, naloxone  **AND** sodium chloride  flush, ondansetron  (ZOFRAN ) IV, ondansetron  **OR** [DISCONTINUED] ondansetron  (ZOFRAN ) IV, oxyCODONE , prochlorperazine   Antibiotics: Anti-infectives (From admission, onward)    Start     Dose/Rate Route Frequency Ordered Stop   05/12/24 2245  doxycycline  (VIBRA -TABS) tablet 100 mg        100 mg Oral Every 12 hours 05/12/24 2230 05/19/24 2159       Objective:  Vital Signs  Vitals:   05/18/24 2258 05/18/24 2301 05/19/24 0536 05/19/24 0844  BP:   112/73   Pulse:   86   Resp:      Temp:   98 F (36.7 C)   TempSrc:   Oral   SpO2: 97% 97% 100% 96%  Weight:      Height:        Intake/Output Summary (Last 24 hours) at 05/19/2024 1031 Last data filed at 05/19/2024 0530 Gross per 24 hour  Intake 360 ml  Output 850 ml  Net -490 ml   Filed Weights   05/12/24 2300 05/17/24 0919  Weight: 52.2 kg 53.5 kg    General appearance: Awake alert.  In no distress Resp: Clear to auscultation bilaterally.  Normal effort Cardio: S1-S2 is normal regular.  No S3-S4.  No rubs murmurs or bruit GI: Abdomen is soft.  Nontender nondistended.  Bowel  sounds are present normal.  No masses organomegaly Extremities: No edema.  Full range of motion of lower extremities. Neurologic: Alert and oriented x3.  No focal neurological deficits.     Lab Results:  Data Reviewed: I have personally reviewed following labs and reports of the imaging studies  CBC: Recent Labs  Lab 05/12/24 1712 05/13/24 0528 05/14/24 0448 05/15/24 0642 05/16/24 0531  WBC 15.8* 13.3* 11.1* 9.6 10.1  NEUTROABS  --   --  7.8* 6.8 7.1  HGB 12.4 12.3 12.2 12.3 12.6  HCT 41.2 40.6 40.9 41.4 42.2  MCV 94.1 95.8 95.1 94.5 95.5  PLT 245 207 207 214 249    Basic Metabolic Panel: Recent Labs  Lab 05/13/24 0528 05/14/24 0448 05/15/24 0642 05/16/24 0531 05/19/24 0538  NA 141 141 140 142 141  K 4.0 3.8 3.8 4.2 4.2  CL 101 100 98 97* 101  CO2 28 29 32 33* 29  GLUCOSE 88 80 93 108* 140*  BUN 14 16 10 12 19   CREATININE 0.42* 0.44 0.39* 0.49 0.53  CALCIUM  9.7 9.3 9.6 10.0 9.7  MG  --  1.8  1.8 1.9  --   PHOS  --  3.4 2.8 3.7  --     GFR: Estimated Creatinine Clearance: 57.2 mL/min (by C-G formula based on SCr of 0.53 mg/dL).  Liver Function Tests: Recent Labs  Lab 05/14/24 0448 05/15/24 0642 05/16/24 0531  AST 17 16 21   ALT 17 15 14   ALKPHOS 118 112 121  BILITOT 0.4 0.3 0.3  PROT 6.3* 6.3* 7.0  ALBUMIN 3.7 3.6 3.9    Coagulation Profile: Recent Labs  Lab 05/13/24 0528  INR 1.0    Radiology Studies: MR LUMBAR SPINE WO CONTRAST Result Date: 05/17/2024 EXAM: MRI LUMBAR SPINE 05/17/2024 12:47:46 PM TECHNIQUE: Multiplanar multisequence MRI of the lumbar spine was performed without the administration of intravenous contrast. COMPARISON: CT lumbar spine 04/30/24 and 05/12/24. Comparison CMD MRI thoracic spine. CLINICAL HISTORY: Compression fracture, lumbar. T \\T \ L spine fx. Abnormal CT's, done yesterday in PACS. FINDINGS: BONES AND ALIGNMENT: Acute compression fracture of L1 with edema along the superior endplate and approximately 20% height loss  centrally. There is additional irregularity of the L2 endplates with edema along the L2 inferior endplate and approximately 35% height loss centrally. The degree of height loss appears slightly increased compared to the CT from 05/12/24. Compression deformities of L3 and L4 status post kyphoplasty without significant edema. There is evidence of acute fracture irregularity of the L5 superior endplate with associated edema and approximately 20% height loss. Lumbar lordosis is maintained. Similar anterolisthesis of L5 on S1. SPINAL CORD: The conus medullaris extends to the mid/lower L2 level. SOFT TISSUES: There is mild edema within the spinal musculature of the lower lumbar spine. L1-L2: At T12-L1 there is a small disc bulge and retropulsion of fracture fragments at the L1 superior endplate which results in slight indentation of the ventral thecal sac, greater on the right. No significant spinal canal or foraminal stenosis. At L1-2 there is a small disc bulge and retropulsion of fracture fragments at the L2 superior endplate without significant spinal canal stenosis. Mild facet arthrosis. No significant foraminal stenosis. L2-L3: At L2-3 there is a small disc bulge and minimal retropulsion at the L3 superior endplate without significant spinal canal stenosis. Bilateral facet arthrosis. No significant foraminal stenosis. L3-L4: At L3-4 there is a small disc bulge. Mild facet arthrosis. No significant spinal canal or foraminal stenosis. L4-L5: At L4-5 is a small disc bulge and minimal retropulsion at the L5 superior endplate. Mild-to-moderate facet arthrosis. No significant spinal canal stenosis. There is mild foraminal stenosis on the right. L5-S1: At L5-S1 there is anterolisthesis with partial uncovering of the disc. Diffuse disc bulge and dense ventral thecal sac with mild lateral recess narrowing. Moderate-to-severe facet arthrosis. Mild spinal canal stenosis. There is mild right and moderate left foraminal stenosis.  Disc bulge contacts the bilateral L5 nerve roots within the foramina. S1-S2: Rudimentary disc at the S1-2 level. There is transitional anatomy with a partially lumbarized S1 vertebra. IMPRESSION: 1. Acute compression fracture of L1 with edema along the superior endplate and approximately 20% height loss centrally. 2. Acute compression fracture of L2 and approximately 35% height loss centrally, slightly increased compared to prior CT. 3. Acute compression fracture of L5 with approximately 20% height loss. 4. Chronic compression deformities of L3 and L4 status post kyphoplasty without evidence of acute fracture. 5. Anterolisthesis of L5 on S1 with partial uncovering of the disc, diffuse disc bulge, and mild spinal canal stenosis. Mild right and moderate left foraminal stenosis. Disc bulge contacts the bilateral L5 nerve roots.  6. Transitional anatomy with a partially lumbarized S1 vertebra and rudimentary disc at the S1-2 level. Electronically signed by: Donnice Mania MD 05/17/2024 02:31 PM EDT RP Workstation: [id number]   MR THORACIC SPINE WO CONTRAST Result Date: 05/17/2024 EXAM: MR Thoracic Spine without 05/17/2024 12:46:57 PM TECHNIQUE: Multiplanar multisequence MRI of the thoracic spine was performed without the administration of intravenous contrast. COMPARISON: CT thoracic spine 05/12/2024 and same day MRI lumbar spine CLINICAL HISTORY: Compression fracture, thoracic. ? Compression fx //anesthesia FINDINGS: BONES AND ALIGNMENT: Radium insertion fracture of T8 with approximately 25% height loss anteriorly. There is an additional compression fracture of T11 with irregularity of the superior endplate and up to 15% height loss. Compression fracture of T12 with irregularity of the superior endplate and up to 10% height loss centrally. Edema is noted along the T8, T11, and T12 superior endplates. Vertebral body heights are otherwise maintained. Thoracic kyphosis is maintained. There is minimal retropulsion at the  superior endplates of T8, T11, and T12. No suspicious osseous lesion. Opacities in the left lung are better evaluated on recent CT chest. SPINAL CORD: There is no high-grade spinal canal stenosis in the thoracic spine. There is a shallow disc bulge at T9-10 which indents the ventrothecal sac without contacting the spinal cord. Additional shallow disc bulge at T11 along with retropulsion which indents the ventral thecal sac. Disc bulge at T11-12 and retropulsion also indents the ventral thecal sac. There is no spinal cord compression. SOFT TISSUES: The paraspinal soft tissues are unremarkable. DEGENERATIVE CHANGES: Disc bulge at T9-10, T11, and T11-12 with associated retropulsion indenting the ventral thecal sac. No high-grade foraminal stenosis. Vasarthrosis at multiple levels. IMPRESSION: 1. Acute compression fracture of T8 (approximately 25% height loss anteriorly) and acute on chronic compression fractures of T11 (up to 15% height loss), and T12 (up to 10% height loss centrally) with associated edema and minimal retropulsion. 2. Degenerative changes as above. No high-grade spinal canal or foraminal stenosis. Electronically signed by: Donnice Mania MD 05/17/2024 02:03 PM EDT RP Workstation: [id number]       LOS: 6 days   Joette Pebbles  [facility] Pager on [email or web address]  05/19/2024, 10:31 AM

## 2024-05-19 NOTE — TOC Progression Note (Signed)
 Transition of Care [facility]) - Progression Note    Patient Details  Name: Tracy Park MRN: [id number] Date of Birth: Aug 02, 1956  [facility]) CM/SW Contact  Doneta Glenys DASEN, RN Phone Number: 05/19/2024, 10:34 AM  Clinical Narrative:    CM called Emmalene Donnella Lat [phone], to inform that patient has accepted bed offer with a tentative 9/12 discharge.   Expected Discharge Plan: Home w Home Health Services Barriers to Discharge: Continued Medical Work up               Expected Discharge Plan and Services In-house Referral: NA Discharge Planning Services: CM Consult Post Acute Care Choice: NA Living arrangements for the past 2 months: Mobile Home                 DME Arranged: N/A DME Agency: NA       HH Arranged: NA HH Agency: NA         Social Drivers of Health (SDOH) Interventions SDOH Screenings   Food Insecurity: No Food Insecurity (05/12/2024)  Recent Concern: Food Insecurity - Food Insecurity Present (03/03/2024)  Housing: Low Risk  (05/12/2024)  Transportation Needs: No Transportation Needs (05/12/2024)  Utilities: Not At Risk (05/12/2024)  Financial Resource Strain: High Risk (10/08/2023)   Received from [facility]  Physical Activity: Unknown (04/07/2023)   Received from [facility]  Social Connections: Moderately Isolated (05/12/2024)  Stress: Stress Concern Present (04/07/2023)   Received from [facility]  Tobacco Use: High Risk (05/17/2024)    Readmission Risk Interventions    05/18/2024    1:13 PM 03/13/2024    3:54 PM 03/04/2024    2:47 PM  Readmission Risk Prevention Plan  Transportation Screening Complete Complete Complete  Medication Review Oceanographer) Complete Complete Complete  PCP or Specialist appointment within 3-5 days of discharge Complete Complete Complete  HRI or Home Care Consult Complete Complete Complete  SW Recovery Care/Counseling Consult Complete Complete Complete  Palliative Care Screening Complete  Complete Not Applicable  Skilled Nursing Facility Complete Not Applicable Not Applicable

## 2024-05-19 NOTE — Plan of Care (Signed)
  Problem: Education: Goal: Knowledge of General Education information will improve Description: Including pain rating scale, medication(s)/side effects and non-pharmacologic comfort measures Outcome: Progressing   Problem: Clinical Measurements: Goal: Will remain free from infection Outcome: Progressing   Problem: Pain Managment: Goal: General experience of comfort will improve and/or be controlled Outcome: Progressing   Problem: Safety: Goal: Ability to remain free from injury will improve Outcome: Progressing

## 2024-05-20 DIAGNOSIS — R52 Pain, unspecified: Secondary | ICD-10-CM | POA: Diagnosis not present

## 2024-05-20 DIAGNOSIS — J9611 Chronic respiratory failure with hypoxia: Secondary | ICD-10-CM

## 2024-05-20 DIAGNOSIS — S22060A Wedge compression fracture of T7-T8 vertebra, initial encounter for closed fracture: Secondary | ICD-10-CM

## 2024-05-20 DIAGNOSIS — S22069A Unspecified fracture of T7-T8 vertebra, initial encounter for closed fracture: Secondary | ICD-10-CM | POA: Diagnosis not present

## 2024-05-20 DIAGNOSIS — I480 Paroxysmal atrial fibrillation: Secondary | ICD-10-CM | POA: Diagnosis not present

## 2024-05-20 DIAGNOSIS — K5900 Constipation, unspecified: Secondary | ICD-10-CM

## 2024-05-20 DIAGNOSIS — Z79899 Other long term (current) drug therapy: Secondary | ICD-10-CM | POA: Diagnosis not present

## 2024-05-20 DIAGNOSIS — K5903 Drug induced constipation: Secondary | ICD-10-CM

## 2024-05-20 DIAGNOSIS — I10 Essential (primary) hypertension: Secondary | ICD-10-CM | POA: Diagnosis not present

## 2024-05-20 DIAGNOSIS — Z515 Encounter for palliative care: Secondary | ICD-10-CM | POA: Diagnosis not present

## 2024-05-20 DIAGNOSIS — J9612 Chronic respiratory failure with hypercapnia: Secondary | ICD-10-CM

## 2024-05-20 MED ORDER — OXYCODONE HCL ER 20 MG PO T12A
20.0000 mg | EXTENDED_RELEASE_TABLET | Freq: Two times a day (BID) | ORAL | Status: DC
  Administered 2024-05-20 – 2024-05-21 (×3): 20 mg via ORAL
  Filled 2024-05-20 (×3): qty 1

## 2024-05-20 MED ORDER — SMOG ENEMA
960.0000 mL | Freq: Once | RECTAL | Status: AC
  Administered 2024-05-20: 960 mL via RECTAL
  Filled 2024-05-20: qty 960

## 2024-05-20 MED ORDER — LACTULOSE 10 GM/15ML PO SOLN
30.0000 g | Freq: Three times a day (TID) | ORAL | Status: DC
  Administered 2024-05-20 (×2): 30 g via ORAL
  Filled 2024-05-20 (×2): qty 45

## 2024-05-20 MED ORDER — NALOXEGOL OXALATE 25 MG PO TABS
25.0000 mg | ORAL_TABLET | Freq: Every day | ORAL | Status: DC
  Administered 2024-05-20: 25 mg via ORAL
  Filled 2024-05-20 (×2): qty 1

## 2024-05-20 NOTE — Progress Notes (Signed)
 Daily Progress Note   Patient Name: Tracy Park       Date: 05/20/2024 DOB: Jan 11, 1956  Age: 68 y.o. MRN#: [id number] Attending Physician: Verdene Purchase, MD Primary Care Physician: Corlis Longs, FNP Admit Date: 05/12/2024 Length of Stay: 7 days  Reason for Consultation/Follow-up: Establishing goals of care  Subjective:   CC: Feels pain medication are helping to take off the edge.  Following up regarding complex medical decision making.  Subjective:  Reviewed EMR including recent documentation from hospitalist and TOC.  At time of EMR review in past 24 hours, patient has used as needed oxycodone  15mg  x 1 dose and 20mg  x 2 doses. Hospitalist has already increased scheduled OxyContin  to 20mg  q 12 hours today.   Presented to bedside to see patient. Patient laying comfortably in bed. Discussed care with RN at bedside. Patient feels the as needed oxycodone  is helping with pain management at current doses. Patient knows the long acting was already increased by hospitalist today. We again reviewed the goal of pain management is to help patient be more functional, would not expect getting rid of pain. Patient acknowledged this.   Discussed patient received SMOG enema this morning without much response, only small movement.  Patient did not have much response with suppository yesterday either, only small movement. Discussed will try more aggressive oral medications today, particularly lactulose , to help with moving bowels. Patient agreeing with this plan.   All questions answered at that time. Noted PMT would continue to follow along with patient's medical journey.   Discussed care with hospitalist and RN to coordinate care.   Objective:   Vital Signs:  BP 133/80 (BP Location: Left Arm)   Pulse 82   Temp 97.9 F (36.6 C) (Oral)   Resp 18   Ht 5' 3 (1.6 m)   Wt 53.5 kg   SpO2 99%   BMI 20.90 kg/m   Physical Exam: General: NAD, alert, chronically ill-appearing,  frail Cardiovascular: RRR Respiratory: no increased work of breathing noted, not in respiratory distress Abdomen: not distended Neuro: A&Ox4, following commands easily Psych: appropriately answers all questions  Assessment & Plan:   Assessment: Patient is a 68 year old female with a past medical history of COPD, chronic hypoxic respiratory failure, adenocarcinoma of the lung status post left upper lobectomy, paroxysmal A-fib, hypothyroidism, anxiety, and PTSD who was admitted on 05/12/2024 for management of back pain.  Upon review of further imaging, has been determined patient has 6 acute fractures in the spine.  Pal medicine team contacted to assist with complex medical decision making.  Recommendations/Plan: # Complex medical decision making/goals of care:  - Discussed care with patient today as detailed above in HPI.  Patient has noted that she does not have support at home.  Patient has previously attempted to go home with hospice support though this did not even have time for initiation before patient had to be rehospitalized.  Patient notes that her daughter does not assist in caring for her.  Patient agreeing to go to rehab at [facility] upon discharge.  TOC assisting with coordination of care and discharge planning.  Palliative medicine will continue to engage in conversations as able and appropriate.  -  Code Status: Limited: Do not attempt resuscitation (DNR) -DNR-LIMITED -Do Not Intubate/DNI   # Symptom management: Patient is receiving these palliative interventions for symptom management with an intent to improve quality of life.   - Pain, acute on chronic in the setting of acute compression fractures with new diagnosis of  osteoporosis   - Continue oxycodone  to 15-20 mg every 4 hours as needed   - Continue IV Dilaudid  1 mg every 4 hours as needed breakthrough after oral oxycodone    - Hospitalist increased OxyContin  to 20 mg every 12 hours.  Discussed patient will need chronic pain  management provider to continue opioids in the outpatient setting.   - Constipation   -Start lactulose  30mg  TID today. Can stop once has good BM.    -Continue senna 2 tabs twice daily scheduled.  Can increase up to 4 tabs twice daily if needed.   - Continue MiraLAX  17 g twice daily.  # Psychosocial Support:  -DaughterShip broker  # Discharge Planning: Skilled nursing facility for rehab  Discussed with: Patient, hospitalist, RN  Thank you for allowing the palliative care team to participate in the care Naba L Gritton.  Tinnie Radar, DO Palliative Care Provider PMT # [phone]  If patient remains symptomatic despite maximum doses, please call PMT at [phone] between 0700 and 1900. Outside of these hours, please call attending, as PMT does not have night coverage.

## 2024-05-20 NOTE — Plan of Care (Signed)
  Problem: Education: Goal: Knowledge of General Education information will improve Description: Including pain rating scale, medication(s)/side effects and non-pharmacologic comfort measures Outcome: Progressing   Problem: Health Behavior/Discharge Planning: Goal: Ability to manage health-related needs will improve Outcome: Progressing   Problem: Clinical Measurements: Goal: Respiratory complications will improve Outcome: Progressing Goal: Cardiovascular complication will be avoided Outcome: Progressing   Problem: Nutrition: Goal: Adequate nutrition will be maintained Outcome: Progressing   Problem: Coping: Goal: Level of anxiety will decrease Outcome: Progressing   Problem: Pain Managment: Goal: General experience of comfort will improve and/or be controlled Outcome: Progressing   Problem: Skin Integrity: Goal: Risk for impaired skin integrity will decrease Outcome: Progressing

## 2024-05-20 NOTE — Plan of Care (Signed)
  Problem: Education: Goal: Knowledge of General Education information will improve Description: Including pain rating scale, medication(s)/side effects and non-pharmacologic comfort measures Outcome: Progressing   Problem: Nutrition: Goal: Adequate nutrition will be maintained Outcome: Progressing   Problem: Pain Managment: Goal: General experience of comfort will improve and/or be controlled Outcome: Progressing   Problem: Safety: Goal: Ability to remain free from injury will improve Outcome: Progressing

## 2024-05-20 NOTE — Progress Notes (Signed)
 [facility] PROGRESS NOTE   Tracy Park FMW:[id number] DOB: Aug 06, 1956 DOA: 05/12/2024  PCP: Corlis Longs, FNP  Brief History: 68 y.o. female with medical history significant of COPD, chronic hypoxemic respiratory failure, adenocarcinoma of the lung status post left upper lobectomy, paroxysmal A-fib, hypothyroidism, anxiety, PTSD, who had recent hospitalization in 7/25 for an L3 compression fracture.  She presents with right-sided back now and there is concern for new T8 compression fracture as well as L5 subacute fracture. Radiology was consulted for kyphoplasty on admission and she was recommended to undergo MRI T and L-spine. Due to significant claustrophobia and anxiety, this is to be arranged under anesthesia at [facility].  Consultants: Interventional radiology.  Palliative medicine  Procedures: None yet.   Subjective/Interval History: Continues to have pain in the back.  No new complaints offered.  Had a small bowel movement yesterday.  Agreeable to an enema today.  No abdominal pain nausea or vomiting.     Assessment/Plan:  Acute on Chronic Back Pain due to Compression Fractures:  - Denies any recent trauma or falls - Presents with significant Back Pain. - CT T and L-spine show concern for T8 and L5 compression fractures - After review by interventional radiology, she may undergo further intervention with kyphoplasty to T8, 12 and L1, L5 Patient underwent MRI of the thoracic and lumbar spine under general anesthesia on 9/8. Multiple compression fractures noted including T8, T11, T12, L1, L2, L5. Informed by IR that patient has too many compression fractures and not amenable to kyphoplasty.  Will need symptomatic management.   Patient has never been officially diagnosed with osteoporosis but has had steroids previously for her lung disease.  She likely does have osteoporosis considering her multiple compression fractures.  Vitamin D  level is low normal.  Started her on  calcium  and vitamin D .  Will also benefit from bisphosphonate at discharge.  No contraindications noted. She has been transitioned from PCA pump to oral pain medicines by palliative care.  Has been started on long-acting OxyContin  which will be titrated up this morning.  Lidocaine  patches can be used topically.  May need further titration of OxyContin  in the outpatient setting. Plan is for discharge to skilled nursing facility for short-term rehab tomorrow if pain is adequately controlled and if constipation has resolved.   COPD Chronic hypoxemia with hypercapnia History of lung adenocarcinoma s/p LUL lobectomy Completed course of antibiotics.  Respiratory status is stable.  Chronically on oxygen .  Constipation Has been constipated for several days.  No significant bowel movement with suppository yesterday.  Will give her smog enema today.  Continue with the MiraLAX  and Senokot.   Constipation is most likely multifactorial including acute pain issues along with narcotic use.   Leukocytosis Resolved   Paroxysmal A-fib:  Continue apixaban  and metoprolol .   Hypothyroidism:  Checked TSH and was 6.430. Free T4 was 1.05. Continue Levothyroxine  125 mcg po Daily and repeat TFTs in 4-6 weeks.   Hyperlipidemia:  C/w Rosuvastatin  20 mg po Daily    Essential HTN:  Continue with metoprolol .  Losartan  is on hold.  Blood pressure is reasonably well-controlled.    Anxiety and Depression and PTSD:  Continue w/ Sertraline  25 mg po Daily   Moderate Protein Calorie Malnutrition C/w Ensure Plus High Protein 237.  Etiology: chronic illness Signs/Symptoms: moderate fat depletion, severe muscle depletion Interventions: Ensure Enlive (each supplement provides 350kcal and 20 grams of protein)   Tobacco Abuse: Smoking Cessation Counseling given. C/w Nicotine  14 mg TD q24h  DVT Prophylaxis: Lovenox  Code Status: DNR Family Communication: Discussed with patient.  No family at bedside Disposition Plan:  Anticipate discharge to SNF tomorrow if she remains stable and if pain is adequately controlled.     Medications: Scheduled:  apixaban   5 mg Oral BID   budesonide -glycopyrrolate -formoterol   2 puff Inhalation BID   calcium -vitamin D   2 tablet Oral BID   feeding supplement  237 mL Oral TID BM   guaiFENesin   600 mg Oral BID    HYDROmorphone  (DILAUDID ) injection  1 mg Intravenous Once   ipratropium-albuterol   3 mL Nebulization BID   levothyroxine   125 mcg Oral Q0600   lidocaine   3 patch Transdermal Q24H   LORazepam   1 mg Intravenous Once   metoprolol  succinate  12.5 mg Oral Daily   nicotine   14 mg Transdermal Daily   oxyCODONE   20 mg Oral Q12H   polyethylene glycol  17 g Oral BID   prednisoLONE  acetate  1 drop Left Eye BID   rosuvastatin   20 mg Oral Daily   senna  2 tablet Oral BID   sertraline   25 mg Oral Daily   SMOG  960 mL Rectal Once   Continuous:   PRN:acetaminophen  **OR** acetaminophen , albuterol , benzonatate , HYDROmorphone  (DILAUDID ) injection, methocarbamol , naloxone  **AND** sodium chloride  flush, ondansetron  (ZOFRAN ) IV, ondansetron  **OR** [DISCONTINUED] ondansetron  (ZOFRAN ) IV, oxyCODONE , prochlorperazine   Antibiotics: Anti-infectives (From admission, onward)    Start     Dose/Rate Route Frequency Ordered Stop   05/12/24 2245  doxycycline  (VIBRA -TABS) tablet 100 mg        100 mg Oral Every 12 hours 05/12/24 2230 05/19/24 1028       Objective:  Vital Signs  Vitals:   05/19/24 1224 05/19/24 2004 05/19/24 2026 05/20/24 0451  BP: 135/79  123/81 133/80  Pulse: 91  77 82  Resp: 17  18 18   Temp: 98.4 F (36.9 C)  98.2 F (36.8 C) 97.9 F (36.6 C)  TempSrc:   Oral Oral  SpO2: 98% 98% 98% 99%  Weight:      Height:        Intake/Output Summary (Last 24 hours) at 05/20/2024 0850 Last data filed at 05/20/2024 0629 Gross per 24 hour  Intake 180 ml  Output 700 ml  Net -520 ml   Filed Weights   05/12/24 2300 05/17/24 0919  Weight: 52.2 kg 53.5 kg     General appearance: Awake alert.  In no distress Resp: Clear to auscultation bilaterally.  Normal effort Cardio: S1-S2 is normal regular.  No S3-S4.  No rubs murmurs or bruit GI: Abdomen is soft.  Nontender nondistended.  Bowel sounds are present normal.  No masses organomegaly Extremities: No edema.  Full range of motion of lower extremities. Neurologic: Alert and oriented x3.  No focal neurological deficits.     Lab Results:  Data Reviewed: I have personally reviewed following labs and reports of the imaging studies  CBC: Recent Labs  Lab 05/14/24 0448 05/15/24 0642 05/16/24 0531  WBC 11.1* 9.6 10.1  NEUTROABS 7.8* 6.8 7.1  HGB 12.2 12.3 12.6  HCT 40.9 41.4 42.2  MCV 95.1 94.5 95.5  PLT 207 214 249    Basic Metabolic Panel: Recent Labs  Lab 05/14/24 0448 05/15/24 0642 05/16/24 0531 05/19/24 0538  NA 141 140 142 141  K 3.8 3.8 4.2 4.2  CL 100 98 97* 101  CO2 29 32 33* 29  GLUCOSE 80 93 108* 140*  BUN 16 10 12 19   CREATININE 0.44 0.39* 0.49 0.53  CALCIUM  9.3 9.6 10.0 9.7  MG 1.8 1.8 1.9  --   PHOS 3.4 2.8 3.7  --     GFR: Estimated Creatinine Clearance: 57.2 mL/min (by C-G formula based on SCr of 0.53 mg/dL).  Liver Function Tests: Recent Labs  Lab 05/14/24 0448 05/15/24 0642 05/16/24 0531  AST 17 16 21   ALT 17 15 14   ALKPHOS 118 112 121  BILITOT 0.4 0.3 0.3  PROT 6.3* 6.3* 7.0  ALBUMIN 3.7 3.6 3.9    Radiology Studies: No results found.      LOS: 7 days   Rafay Dahan  [facility] Pager on [email or web address]  05/20/2024, 8:50 AM

## 2024-05-21 DIAGNOSIS — Z515 Encounter for palliative care: Secondary | ICD-10-CM | POA: Diagnosis not present

## 2024-05-21 DIAGNOSIS — S22060S Wedge compression fracture of T7-T8 vertebra, sequela: Secondary | ICD-10-CM

## 2024-05-21 DIAGNOSIS — S22069A Unspecified fracture of T7-T8 vertebra, initial encounter for closed fracture: Secondary | ICD-10-CM | POA: Diagnosis not present

## 2024-05-21 DIAGNOSIS — E440 Moderate protein-calorie malnutrition: Secondary | ICD-10-CM | POA: Diagnosis not present

## 2024-05-21 DIAGNOSIS — R52 Pain, unspecified: Secondary | ICD-10-CM | POA: Diagnosis not present

## 2024-05-21 DIAGNOSIS — S22060D Wedge compression fracture of T7-T8 vertebra, subsequent encounter for fracture with routine healing: Secondary | ICD-10-CM

## 2024-05-21 LAB — CBC
HCT: 43.1 % (ref 36.0–46.0)
Hemoglobin: 12.8 g/dL (ref 12.0–15.0)
MCH: 27.4 pg (ref 26.0–34.0)
MCHC: 29.7 g/dL — ABNORMAL LOW (ref 30.0–36.0)
MCV: 92.3 fL (ref 80.0–100.0)
Platelets: 255 K/uL (ref 150–400)
RBC: 4.67 MIL/uL (ref 3.87–5.11)
RDW: 13.6 % (ref 11.5–15.5)
WBC: 11.2 K/uL — ABNORMAL HIGH (ref 4.0–10.5)
nRBC: 0 % (ref 0.0–0.2)

## 2024-05-21 LAB — BASIC METABOLIC PANEL WITH GFR
Anion gap: 11 (ref 5–15)
BUN: 15 mg/dL (ref 8–23)
CO2: 31 mmol/L (ref 22–32)
Calcium: 10.9 mg/dL — ABNORMAL HIGH (ref 8.9–10.3)
Chloride: 98 mmol/L (ref 98–111)
Creatinine, Ser: 0.56 mg/dL (ref 0.44–1.00)
GFR, Estimated: >60 mL/min (ref >60)
Glucose, Bld: 116 mg/dL — ABNORMAL HIGH (ref 70–99)
Potassium: 4 mmol/L (ref 3.5–5.1)
Sodium: 141 mmol/L (ref 135–145)

## 2024-05-21 LAB — MAGNESIUM: Magnesium: 1.8 mg/dL (ref 1.7–2.4)

## 2024-05-21 MED ORDER — LACTULOSE 10 GM/15ML PO SOLN: 30.0000 g | Freq: Two times a day (BID) | ORAL | Status: AC | PRN

## 2024-05-21 MED ORDER — OXYCODONE HCL 10 MG PO TABS: 20.0000 mg | ORAL_TABLET | ORAL | 0 refills | Status: AC | PRN

## 2024-05-21 MED ORDER — POLYETHYLENE GLYCOL 3350 17 G PO PACK
17.0000 g | PACK | Freq: Two times a day (BID) | ORAL | Status: DC
  Administered 2024-05-21: 17 g via ORAL
  Filled 2024-05-21: qty 1

## 2024-05-21 MED ORDER — OYSTER SHELL CALCIUM/D3 500-5 MG-MCG PO TABS: 2.0000 | ORAL_TABLET | Freq: Two times a day (BID) | ORAL | Status: AC

## 2024-05-21 MED ORDER — POLYETHYLENE GLYCOL 3350 17 G PO PACK: 17.0000 g | PACK | Freq: Two times a day (BID) | ORAL | Status: AC

## 2024-05-21 MED ORDER — SENNOSIDES-DOCUSATE SODIUM 8.6-50 MG PO TABS: 2.0000 | ORAL_TABLET | Freq: Two times a day (BID) | ORAL | Status: AC

## 2024-05-21 MED ORDER — NICOTINE 14 MG/24HR TD PT24: 14.0000 mg | MEDICATED_PATCH | Freq: Every day | TRANSDERMAL | Status: AC

## 2024-05-21 MED ORDER — OXYCODONE HCL ER 20 MG PO T12A: 20.0000 mg | EXTENDED_RELEASE_TABLET | Freq: Two times a day (BID) | ORAL | 0 refills | Status: AC

## 2024-05-21 MED ORDER — METHOCARBAMOL 500 MG PO TABS: 500.0000 mg | ORAL_TABLET | Freq: Four times a day (QID) | ORAL | Status: AC | PRN

## 2024-05-21 MED ORDER — LACTULOSE 10 GM/15ML PO SOLN: 30.0000 g | Freq: Two times a day (BID) | ORAL | Status: DC | PRN

## 2024-05-21 MED ORDER — ACETAMINOPHEN 325 MG PO TABS: 650.0000 mg | ORAL_TABLET | Freq: Four times a day (QID) | ORAL | Status: AC | PRN

## 2024-05-21 MED ORDER — GABAPENTIN 300 MG PO CAPS: 300.0000 mg | ORAL_CAPSULE | Freq: Every day | ORAL | 0 refills | Status: AC

## 2024-05-21 MED ORDER — LIDOCAINE 5 % EX PTCH: 3.0000 | MEDICATED_PATCH | CUTANEOUS | Status: DC

## 2024-05-21 MED ORDER — ENSURE PLUS HIGH PROTEIN PO LIQD: 237.0000 mL | Freq: Three times a day (TID) | ORAL | Status: AC

## 2024-05-21 MED ORDER — SENNOSIDES-DOCUSATE SODIUM 8.6-50 MG PO TABS
2.0000 | ORAL_TABLET | Freq: Two times a day (BID) | ORAL | Status: DC
  Administered 2024-05-21: 2 via ORAL
  Filled 2024-05-21: qty 2

## 2024-05-21 MED ORDER — NALOXONE HCL 4 MG/0.1ML NA LIQD: 1.0000 | NASAL | 0 refills | Status: AC | PRN

## 2024-05-21 NOTE — TOC Transition Note (Signed)
 Transition of Care [facility]) - Discharge Note   Patient Details  Name: Tracy Park MRN: [id number] Date of Birth: Dec 19, 1955  Transition of Care [facility]) CM/SW Contact:  Doneta Glenys DASEN, RN Phone Number: 05/21/2024, 11:57 AM   Clinical Narrative:    Patient discharging to [facility] via [facility], Discharge packet contains facesheet, medical necessity, discharge summary, 4 signed prescriptions and signed DNR. Nurse to call report to [phone].   Final next level of care: Skilled Nursing Facility Barriers to Discharge: Barriers Resolved   Patient Goals and CMS Choice Patient states their goals for this hospitalization and ongoing recovery are:: Home with sister CMS Medicare.gov Compare Post Acute Care list provided to:: Patient Choice offered to / list presented to : Patient [place] ownership interest in [facility]provided to:: Parent NA    Discharge Placement              Patient chooses bed at: [facility] Patient to be transferred to facility by: [facility] Name of family member notified: [facility] Patient and family notified of of transfer: 05/21/24  Discharge Plan and Services Additional resources added to the After Visit Summary for   In-house Referral: NA Discharge Planning Services: CM Consult Post Acute Care Choice: NA          DME Arranged: N/A DME Agency: NA       HH Arranged: NA HH Agency: NA        Social Drivers of Health (SDOH) Interventions SDOH Screenings   Food Insecurity: No Food Insecurity (05/12/2024)  Recent Concern: Food Insecurity - Food Insecurity Present (03/03/2024)  Housing: Low Risk  (05/12/2024)  Transportation Needs: No Transportation Needs (05/12/2024)  Utilities: Not At Risk (05/12/2024)  Financial Resource Strain: High Risk (10/08/2023)   Received from [facility]  Physical Activity: Unknown (04/07/2023)   Received from [facility]  Social Connections: Moderately Isolated (05/12/2024)  Stress: Stress Concern Present  (04/07/2023)   Received from [facility]  Tobacco Use: High Risk (05/17/2024)     Readmission Risk Interventions    05/18/2024    1:13 PM 03/13/2024    3:54 PM 03/04/2024    2:47 PM  Readmission Risk Prevention Plan  Transportation Screening Complete Complete Complete  Medication Review Oceanographer) Complete Complete Complete  PCP or Specialist appointment within 3-5 days of discharge Complete Complete Complete  HRI or Home Care Consult Complete Complete Complete  SW Recovery Care/Counseling Consult Complete Complete Complete  Palliative Care Screening Complete Complete Not Applicable  Skilled Nursing Facility Complete Not Applicable Not Applicable

## 2024-05-21 NOTE — Progress Notes (Signed)
 Daily Progress Note   Patient Name: Tracy Park       Date: 05/21/2024 DOB: 1955-12-31  Age: 68 y.o. MRN#: [id number] Attending Physician: Tracy Purchase, MD Primary Care Physician: Tracy Longs, FNP Admit Date: 05/12/2024 Length of Stay: 8 days  Reason for Consultation/Follow-up: Establishing goals of care  Subjective:   CC: Feeling better today.  Following up regarding complex medical decision making.  Subjective:  Reviewed EMR including recent documentation from hospitalist and TOC.  At time of EMR review in past 24 hours patient has received as needed oxycodone  15 mg x 1 dose.  Presented to bedside to see patient.  Patient noted that she had very good bowel movement yesterday evening after receiving lactulose .  Current plan is for patient to go to rehab today.  Patient agreeing with this plan.  Patient feels her symptoms are better managed at this time.  Spent time providing emotional support via active listening.  All questions answered at that time.  Discussed care with hospitalist and RN to coordinate care.  Objective:   Vital Signs:  BP 114/79 (BP Location: Right Arm)   Pulse 93   Temp (!) 97.3 F (36.3 C) (Oral)   Resp 16   Ht 5' 3 (1.6 m)   Wt 53.5 kg   SpO2 99%   BMI 20.90 kg/m   Physical Exam: General: NAD, alert, chronically ill-appearing, frail Cardiovascular: RRR Respiratory: no increased work of breathing noted, not in respiratory distress Abdomen: not distended Neuro: A&Ox4, following commands easily Psych: appropriately answers all questions  Assessment & Plan:   Assessment: Patient is a 68 year old female with a past medical history of COPD, chronic hypoxic respiratory failure, adenocarcinoma of the lung status post left upper lobectomy, paroxysmal A-fib, hypothyroidism, anxiety, and PTSD who was admitted on 05/12/2024 for management of back pain.  Upon review of further imaging, has been determined patient has 6 acute fractures in the spine.  Pal  medicine team contacted to assist with complex medical decision making.  Recommendations/Plan: # Complex medical decision making/goals of care:  - Discussed care with patient today as detailed above in HPI.  Patient has already noted that she does not have support at home.  Patient has previously attempted to go home with hospice support though this did not even have time for initiation before patient had to be rehospitalized.  Patient notes that her daughter does not assist in caring for her.  Patient agreeing to go to rehab at [facility] today.  TOC assisting with coordination of care and discharge planning.   -  Code Status: Limited: Do not attempt resuscitation (DNR) -DNR-LIMITED -Do Not Intubate/DNI   # Symptom management: Patient is receiving these palliative interventions for symptom management with an intent to improve quality of life.   - Pain, acute on chronic in the setting of acute compression fractures with new diagnosis of osteoporosis   - Continue oxycodone  to 15-20 mg every 4 hours as needed   - Continue IV Dilaudid  1 mg every 4 hours as needed breakthrough after oral oxycodone    - Receiving OxyContin  to 20 mg every 12 hours.  Discussed patient will need chronic pain management provider to continue opioids in the outpatient setting.   - Constipation   - Continue lactulose  as needed   -Continue senna 2 tabs twice daily scheduled.  Can increase up to 4 tabs twice daily if needed.   - Continue MiraLAX  17 g twice daily.  # Psychosocial Support:  -DaughterGLENWOOD Park  #  Discharge Planning: Skilled nursing facility for rehab  Discussed with: Patient, hospitalist, RN  Thank you for allowing the palliative care team to participate in the care Tracy Park.  Tracy Radar, DO Palliative Care Provider PMT # [phone]  If patient remains symptomatic despite maximum doses, please call PMT at [phone] between 0700 and 1900. Outside of these hours, please call attending, as  PMT does not have night coverage.

## 2024-05-21 NOTE — Discharge Summary (Signed)
 [facility]  Physician Discharge Summary   Patient ID: Tracy Park MRN: [id number] DOB/AGE: 03/18/1956 68 y.o.  Admit date: 05/12/2024 Discharge date:   05/21/2024   PCP: Corlis Longs, FNP  DISCHARGE DIAGNOSES:    Compression fracture of T8 vertebra (HCC)   Tobacco use   Chronic respiratory failure with hypoxia and hypercapnia (HCC)   Anxiety and depression   History of lung cancer   Essential hypertension   Paroxysmal atrial fibrillation (HCC)   COPD (chronic obstructive pulmonary disease) (HCC)   Malnutrition of moderate degree   Pain   Constipation   RECOMMENDATIONS FOR OUTPATIENT FOLLOW UP: Please check CBC and basic metabolic panel in 1 week    Home Health: SNF Equipment/Devices: None  CODE STATUS:DNR   DISCHARGE CONDITION: fair  Diet recommendation: Regular as tolerated  INITIAL HISTORY: 68 y.o. female with medical history significant of COPD, chronic hypoxemic respiratory failure, adenocarcinoma of the lung status post left upper lobectomy, paroxysmal A-fib, hypothyroidism, anxiety, PTSD, who had recent hospitalization in 7/25 for an L3 compression fracture.  She presents with right-sided back now and there is concern for new T8 compression fracture as well as L5 subacute fracture. Radiology was consulted for kyphoplasty on admission and she was recommended to undergo MRI T and L-spine. Due to significant claustrophobia and anxiety, this is to be arranged under anesthesia at [facility].   Consultants: Interventional radiology.  Palliative medicine   Procedures: None  HOSPITAL COURSE:   Acute on Chronic Back Pain due to Compression Fractures:  CT T and L-spine show concern for T8 and L5 compression fractures Patient underwent MRI of the thoracic and lumbar spine under general anesthesia on 9/8. Multiple compression fractures noted including T8, T11, T12, L1, L2, L5. Informed by IR that patient has too many compression fractures and not amenable to  kyphoplasty.  Will need symptomatic management.   Patient has never been officially diagnosed with osteoporosis but has had steroids previously for her lung disease.  She likely does have osteoporosis considering her multiple compression fractures.  Vitamin D  level is low normal.  Started her on calcium  and vitamin D .  Will also benefit from bisphosphonate at discharge.  No contraindications noted. She has been transitioned from PCA pump to oral pain medicines by palliative care.  Has been started on long-acting OxyContin  has been titrated here in the hospital.  Further titration can be done at [facility].   Lidocaine  patches can be used topically.   Seen by PT and OT.  SNF is recommended for short-term rehab.   COPD Chronic hypoxemia with hypercapnia History of lung adenocarcinoma s/p LUL lobectomy Completed course of antibiotics.  Respiratory status is stable.  Chronically on oxygen .   Constipation Has been constipated for several days.  No significant bowel movement with suppository.  She was given enemas.  Subsequently given Movantik  after which she had 2 large bowel movements.  However she also got nauseated.  Will discontinue the Movantik  and put her back on MiraLAX  Senokot and lactulose  as needed.  Constipation is most likely multifactorial including acute pain issues along with narcotic use.   Leukocytosis Resolved   Paroxysmal A-fib:  Continue apixaban  and metoprolol .   Hypothyroidism:  Checked TSH and was 6.430. Free T4 was 1.05. Continue Levothyroxine  125 mcg po Daily and repeat TFTs in 4-6 weeks.   Hyperlipidemia:  C/w Rosuvastatin  20 mg po Daily    Essential HTN:  Continue metoprolol  and losartan .  Mildly elevated calcium  levels noted today.  It has  been normal the last several days.  Likely erroneous.  Will recommend this be rechecked in a week at the skilled nursing facility.   Anxiety and Depression and PTSD:  Continue w/ Sertraline  25 mg po Daily   Moderate Protein Calorie  Malnutrition C/w Ensure Plus High Protein 237.  Etiology: chronic illness Signs/Symptoms: moderate fat depletion, severe muscle depletion Interventions: Ensure Enlive (each supplement provides 350kcal and 20 grams of protein)   Tobacco Abuse: Smoking Cessation Counseling given. C/w Nicotine  14 mg TD q24h  Patient is stable.  Okay for discharge later today if she tolerates a diet and if she does not have any further nausea and vomiting.  PERTINENT LABS:  The results of significant diagnostics from this hospitalization (including imaging, microbiology, ancillary and laboratory) are listed below for reference.     Labs:   Basic Metabolic Panel: Recent Labs  Lab 05/15/24 0642 05/16/24 0531 05/19/24 0538 05/21/24 0657  NA 140 142 141 141  K 3.8 4.2 4.2 4.0  CL 98 97* 101 98  CO2 32 33* 29 31  GLUCOSE 93 108* 140* 116*  BUN 10 12 19 15   CREATININE 0.39* 0.49 0.53 0.56  CALCIUM  9.6 10.0 9.7 10.9*  MG 1.8 1.9  --  1.8  PHOS 2.8 3.7  --   --    Liver Function Tests: Recent Labs  Lab 05/15/24 0642 05/16/24 0531  AST 16 21  ALT 15 14  ALKPHOS 112 121  BILITOT 0.3 0.3  PROT 6.3* 7.0  ALBUMIN 3.6 3.9   CBC: Recent Labs  Lab 05/15/24 0642 05/16/24 0531 05/21/24 0657  WBC 9.6 10.1 11.2*  NEUTROABS 6.8 7.1  --   HGB 12.3 12.6 12.8  HCT 41.4 42.2 43.1  MCV 94.5 95.5 92.3  PLT 214 249 255     IMAGING STUDIES MR LUMBAR SPINE WO CONTRAST Result Date: 05/17/2024 EXAM: MRI LUMBAR SPINE 05/17/2024 12:47:46 PM TECHNIQUE: Multiplanar multisequence MRI of the lumbar spine was performed without the administration of intravenous contrast. COMPARISON: CT lumbar spine 04/30/24 and 05/12/24. Comparison CMD MRI thoracic spine. CLINICAL HISTORY: Compression fracture, lumbar. T \\T \ L spine fx. Abnormal CT's, done yesterday in PACS. FINDINGS: BONES AND ALIGNMENT: Acute compression fracture of L1 with edema along the superior endplate and approximately 20% height loss centrally. There is  additional irregularity of the L2 endplates with edema along the L2 inferior endplate and approximately 35% height loss centrally. The degree of height loss appears slightly increased compared to the CT from 05/12/24. Compression deformities of L3 and L4 status post kyphoplasty without significant edema. There is evidence of acute fracture irregularity of the L5 superior endplate with associated edema and approximately 20% height loss. Lumbar lordosis is maintained. Similar anterolisthesis of L5 on S1. SPINAL CORD: The conus medullaris extends to the mid/lower L2 level. SOFT TISSUES: There is mild edema within the spinal musculature of the lower lumbar spine. L1-L2: At T12-L1 there is a small disc bulge and retropulsion of fracture fragments at the L1 superior endplate which results in slight indentation of the ventral thecal sac, greater on the right. No significant spinal canal or foraminal stenosis. At L1-2 there is a small disc bulge and retropulsion of fracture fragments at the L2 superior endplate without significant spinal canal stenosis. Mild facet arthrosis. No significant foraminal stenosis. L2-L3: At L2-3 there is a small disc bulge and minimal retropulsion at the L3 superior endplate without significant spinal canal stenosis. Bilateral facet arthrosis. No significant foraminal stenosis. L3-L4: At L3-4 there  is a small disc bulge. Mild facet arthrosis. No significant spinal canal or foraminal stenosis. L4-L5: At L4-5 is a small disc bulge and minimal retropulsion at the L5 superior endplate. Mild-to-moderate facet arthrosis. No significant spinal canal stenosis. There is mild foraminal stenosis on the right. L5-S1: At L5-S1 there is anterolisthesis with partial uncovering of the disc. Diffuse disc bulge and dense ventral thecal sac with mild lateral recess narrowing. Moderate-to-severe facet arthrosis. Mild spinal canal stenosis. There is mild right and moderate left foraminal stenosis. Disc bulge contacts  the bilateral L5 nerve roots within the foramina. S1-S2: Rudimentary disc at the S1-2 level. There is transitional anatomy with a partially lumbarized S1 vertebra. IMPRESSION: 1. Acute compression fracture of L1 with edema along the superior endplate and approximately 20% height loss centrally. 2. Acute compression fracture of L2 and approximately 35% height loss centrally, slightly increased compared to prior CT. 3. Acute compression fracture of L5 with approximately 20% height loss. 4. Chronic compression deformities of L3 and L4 status post kyphoplasty without evidence of acute fracture. 5. Anterolisthesis of L5 on S1 with partial uncovering of the disc, diffuse disc bulge, and mild spinal canal stenosis. Mild right and moderate left foraminal stenosis. Disc bulge contacts the bilateral L5 nerve roots. 6. Transitional anatomy with a partially lumbarized S1 vertebra and rudimentary disc at the S1-2 level. Electronically signed by: Donnice Mania MD 05/17/2024 02:31 PM EDT RP Workstation: [id number]   MR THORACIC SPINE WO CONTRAST Result Date: 05/17/2024 EXAM: MR Thoracic Spine without 05/17/2024 12:46:57 PM TECHNIQUE: Multiplanar multisequence MRI of the thoracic spine was performed without the administration of intravenous contrast. COMPARISON: CT thoracic spine 05/12/2024 and same day MRI lumbar spine CLINICAL HISTORY: Compression fracture, thoracic. ? Compression fx //anesthesia FINDINGS: BONES AND ALIGNMENT: Radium insertion fracture of T8 with approximately 25% height loss anteriorly. There is an additional compression fracture of T11 with irregularity of the superior endplate and up to 15% height loss. Compression fracture of T12 with irregularity of the superior endplate and up to 10% height loss centrally. Edema is noted along the T8, T11, and T12 superior endplates. Vertebral body heights are otherwise maintained. Thoracic kyphosis is maintained. There is minimal retropulsion at the superior endplates of  T8, U88, and T12. No suspicious osseous lesion. Opacities in the left lung are better evaluated on recent CT chest. SPINAL CORD: There is no high-grade spinal canal stenosis in the thoracic spine. There is a shallow disc bulge at T9-10 which indents the ventrothecal sac without contacting the spinal cord. Additional shallow disc bulge at T11 along with retropulsion which indents the ventral thecal sac. Disc bulge at T11-12 and retropulsion also indents the ventral thecal sac. There is no spinal cord compression. SOFT TISSUES: The paraspinal soft tissues are unremarkable. DEGENERATIVE CHANGES: Disc bulge at T9-10, T11, and T11-12 with associated retropulsion indenting the ventral thecal sac. No high-grade foraminal stenosis. Vasarthrosis at multiple levels. IMPRESSION: 1. Acute compression fracture of T8 (approximately 25% height loss anteriorly) and acute on chronic compression fractures of T11 (up to 15% height loss), and T12 (up to 10% height loss centrally) with associated edema and minimal retropulsion. 2. Degenerative changes as above. No high-grade spinal canal or foraminal stenosis. Electronically signed by: Donnice Mania MD 05/17/2024 02:03 PM EDT RP Workstation: [id number]   DG CHEST PORT 1 VIEW Result Date: 05/16/2024 EXAM: 1 VIEW XRAY OF THE CHEST 05/16/2024 01:04:00 AM COMPARISON: 05/15/2024 CLINICAL HISTORY: SOB (shortness of breath) [id number]. SOB FINDINGS: LUNGS AND PLEURA: Similar volume loss  with left apex opacity. Similar airspace opacity in the right apex. Chronic interstitial thickening. No pleural effusion. No pneumothorax. HEART AND MEDIASTINUM: Surgical clips in left hilum. No acute abnormality of the cardiac and mediastinal silhouettes. BONES AND SOFT TISSUES: No acute osseous abnormality. IMPRESSION: 1. Similar volume loss with left apex opacity and similar airspace opacity in the right apex. 2. Chronic interstitial thickening. 3. No acute cardiopulmonary process. Electronically signed by:  Norman Gatlin MD 05/16/2024 01:12 AM EDT RP Workstation: [id number]   DG CHEST PORT 1 VIEW Result Date: 05/15/2024 CLINICAL DATA:  Shortness of breath. EXAM: PORTABLE CHEST 1 VIEW COMPARISON:  05/14/2024 FINDINGS: Pleuroparenchymal scarring in the left apex and upper lobe is stable with volume loss again noted left hemithorax. Right lung appears hyperexpanded but clear. Interstitial markings are diffusely coarsened with chronic features. Cardiopericardial silhouette is at upper limits of normal for size. No acute bony abnormality. Telemetry leads overlie the chest. IMPRESSION: Chronic pleuroparenchymal scarring in the left apex and upper lobe with volume loss in the left hemithorax. No acute cardiopulmonary findings. Electronically Signed   By: Camellia Candle M.D.   On: 05/15/2024 08:29   DG CHEST PORT 1 VIEW Result Date: 05/14/2024 CLINICAL DATA:  [id number].  Shortness of breath. EXAM: PORTABLE CHEST 1 VIEW COMPARISON:  Portable chest 04/10/2024, CTA chest 05/12/2024. FINDINGS: 5:11 a.m. There are stable post treatment/postsurgical changes on the left with right upper lobectomy and scarring, volume loss and chronic right apical opacity. The lungs emphysematous and clear of infiltrates. The sulci are sharp. The mediastinum is stable with shift to the left. The cardiac size is normal. No new osseous finding. IMPRESSION: No evidence of acute chest disease. Stable post treatment/postsurgical changes on the left. Emphysema. Electronically Signed   By: Francis Quam M.D.   On: 05/14/2024 06:03   CT L-SPINE NO CHARGE Result Date: 05/12/2024 CLINICAL DATA:  Back pain EXAM: CT Thoracic and Lumbar spine with contrast TECHNIQUE: Multiplanar CT images of the thoracic and lumbar spine were reconstructed from contemporary CT of the Chest, Abdomen, and Pelvis. RADIATION DOSE REDUCTION: This exam was performed according to the departmental dose-optimization program which includes automated exposure control, adjustment of the  mA and/or kV according to patient size and/or use of iterative reconstruction technique. CONTRAST:  No additional contrast was administered for these reconstructions. COMPARISON:  CT lumbar spine 04/10/2024, CT chest 01/10/2024 FINDINGS: CT THORACIC SPINE FINDINGS Alignment: Normal. Vertebrae: There is an acute appearing superior endplate fracture of T8 with approximately 20-30% loss of height and no retropulsion. The posterior elements appear intact. There are superior endplate fractures of T11 and T12 with approximately 10-20% loss of height which appear chronic in nature. Remaining vertebral body height is preserved. Osseous structures are mildly osteopenic. Paraspinal and other soft tissues: There is mild paravertebral soft tissue swelling in keeping with edema or interstitial hemorrhage anterior to T8 the paraspinal soft tissues are otherwise unremarkable. See accompanying report for CT examination of the chest, abdomen, and pelvis. Disc levels: There is mild endplate remodeling and vacuum disc phenomena at T10-11 and T12-L1 in keeping with changes of mild to moderate degenerative disc disease. No high-grade canal stenosis. No significant neuroforaminal narrowing. CT LUMBAR SPINE FINDINGS Segmentation: Transitional lumbar anatomy with a partially lumbarized S1. Consistent numbering system utilized based on the presence of a fully formed disc at L5-S1. Alignment: Grade 2 anterolisthesis L5-S1 approximately 8 mm, stable. Otherwise normal lumbar lordosis Vertebrae: Superior endplate fracture of L1 again seen with progressive sclerosis and loss of  height of the L1 vertebral body now 10-20%. No retropulsion. Posterior elements are intact. Stable superior endplate fracture of L2. Stable superior endplate fractures of L3 and L4 status post vertebroplasty. New superior endplate fracture of L5 with approximately 20-30% loss of height and no retropulsion. Posterior elements appear intact at this level. Paraspinal and  other soft tissues: Lack of significant soft tissue swelling surrounding the L5 vertebral body suggests this fracture may be subacute in nature. No paraspinal fluid collection is seen. Refer to dictated report for CT examination of the chest, abdomen, pelvis for visceral findings. Disc levels: Vacuum disc phenomena L1-2 and disc space narrowing at L5-S1 is present in keeping with changes of moderate degenerative disc disease. Remaining disc heights are preserved. No high-grade canal stenosis. Disc height loss, un covered disc material, and anterolisthesis at L5-S1 result in severe left and moderate to severe right neuroforaminal narrowing with probable impingement of the exiting L5 nerve roots bilaterally. IMPRESSION: 1. Acute appearing superior endplate fracture of T8 with approximately 20-30% loss of height and no retropulsion. 2. New superior endplate fracture of L5 since prior examination of 04/10/2024 with approximately 20-30% loss of height and no retropulsion. Lack of significant soft tissue swelling surrounding the L5 vertebral body suggests this fracture may be subacute in nature. 3. Progressive loss of height of the L1 vertebral body now 10-20%. No retropulsion. 4. Stable superior endplate fractures of T11, T12, L2, L3, and L4 status post vertebroplasty at L3 and L4. 5. Stable grade 2 anterolisthesis L5-S1 with resultant severe left and moderate to severe right neuroforaminal narrowing with probable impingement of the exiting L5 nerve roots bilaterally. Electronically Signed   By: Dorethia Molt M.D.   On: 05/12/2024 20:54   CT T-SPINE NO CHARGE Result Date: 05/12/2024 CLINICAL DATA:  Back pain EXAM: CT Thoracic and Lumbar spine with contrast TECHNIQUE: Multiplanar CT images of the thoracic and lumbar spine were reconstructed from contemporary CT of the Chest, Abdomen, and Pelvis. RADIATION DOSE REDUCTION: This exam was performed according to the departmental dose-optimization program which includes  automated exposure control, adjustment of the mA and/or kV according to patient size and/or use of iterative reconstruction technique. CONTRAST:  No additional contrast was administered for these reconstructions. COMPARISON:  CT lumbar spine 04/10/2024, CT chest 01/10/2024 FINDINGS: CT THORACIC SPINE FINDINGS Alignment: Normal. Vertebrae: There is an acute appearing superior endplate fracture of T8 with approximately 20-30% loss of height and no retropulsion. The posterior elements appear intact. There are superior endplate fractures of T11 and T12 with approximately 10-20% loss of height which appear chronic in nature. Remaining vertebral body height is preserved. Osseous structures are mildly osteopenic. Paraspinal and other soft tissues: There is mild paravertebral soft tissue swelling in keeping with edema or interstitial hemorrhage anterior to T8 the paraspinal soft tissues are otherwise unremarkable. See accompanying report for CT examination of the chest, abdomen, and pelvis. Disc levels: There is mild endplate remodeling and vacuum disc phenomena at T10-11 and T12-L1 in keeping with changes of mild to moderate degenerative disc disease. No high-grade canal stenosis. No significant neuroforaminal narrowing. CT LUMBAR SPINE FINDINGS Segmentation: Transitional lumbar anatomy with a partially lumbarized S1. Consistent numbering system utilized based on the presence of a fully formed disc at L5-S1. Alignment: Grade 2 anterolisthesis L5-S1 approximately 8 mm, stable. Otherwise normal lumbar lordosis Vertebrae: Superior endplate fracture of L1 again seen with progressive sclerosis and loss of height of the L1 vertebral body now 10-20%. No retropulsion. Posterior elements are intact. Stable  superior endplate fracture of L2. Stable superior endplate fractures of L3 and L4 status post vertebroplasty. New superior endplate fracture of L5 with approximately 20-30% loss of height and no retropulsion. Posterior elements  appear intact at this level. Paraspinal and other soft tissues: Lack of significant soft tissue swelling surrounding the L5 vertebral body suggests this fracture may be subacute in nature. No paraspinal fluid collection is seen. Refer to dictated report for CT examination of the chest, abdomen, pelvis for visceral findings. Disc levels: Vacuum disc phenomena L1-2 and disc space narrowing at L5-S1 is present in keeping with changes of moderate degenerative disc disease. Remaining disc heights are preserved. No high-grade canal stenosis. Disc height loss, un covered disc material, and anterolisthesis at L5-S1 result in severe left and moderate to severe right neuroforaminal narrowing with probable impingement of the exiting L5 nerve roots bilaterally. IMPRESSION: 1. Acute appearing superior endplate fracture of T8 with approximately 20-30% loss of height and no retropulsion. 2. New superior endplate fracture of L5 since prior examination of 04/10/2024 with approximately 20-30% loss of height and no retropulsion. Lack of significant soft tissue swelling surrounding the L5 vertebral body suggests this fracture may be subacute in nature. 3. Progressive loss of height of the L1 vertebral body now 10-20%. No retropulsion. 4. Stable superior endplate fractures of T11, T12, L2, L3, and L4 status post vertebroplasty at L3 and L4. 5. Stable grade 2 anterolisthesis L5-S1 with resultant severe left and moderate to severe right neuroforaminal narrowing with probable impingement of the exiting L5 nerve roots bilaterally. Electronically Signed   By: Dorethia Molt M.D.   On: 05/12/2024 20:54   CT Angio Chest PE W and/or Wo Contrast Result Date: 05/12/2024 CLINICAL DATA:  High probability for PE. Abdominal and right flank pain. EXAM: CT ANGIOGRAPHY CHEST CT ABDOMEN AND PELVIS WITH CONTRAST TECHNIQUE: Multidetector CT imaging of the chest was performed using the standard protocol during bolus administration of intravenous contrast.  Multiplanar CT image reconstructions and MIPs were obtained to evaluate the vascular anatomy. Multidetector CT imaging of the abdomen and pelvis was performed using the standard protocol during bolus administration of intravenous contrast. RADIATION DOSE REDUCTION: This exam was performed according to the departmental dose-optimization program which includes automated exposure control, adjustment of the mA and/or kV according to patient size and/or use of iterative reconstruction technique. CONTRAST:  80mL OMNIPAQUE  IOHEXOL  350 MG/ML SOLN COMPARISON:  CT renal stone 04/10/2024. CT angiogram chest 01/10/2024. FINDINGS: CTA CHEST FINDINGS Cardiovascular: Satisfactory opacification of the pulmonary arteries to the segmental level. No evidence of pulmonary embolism. Normal heart size. No pericardial effusion. Mediastinum/Nodes: No enlarged mediastinal, hilar, or axillary lymph nodes. Thyroid  gland, trachea, and esophagus demonstrate no significant findings. Lungs/Pleura: Left upper lobectomy changes are again seen. There stable pleural-parenchymal thickening in the upper left hemithorax. Moderate emphysema is again seen. There is bilateral peribronchial wall thickening diffusely. Focal airspace opacities in the posterior right upper lobe have minimally increased in size. There are scattered tree-in-bud opacities throughout the left lower lobe similar to prior. There is no pneumothorax. Trachea and central airways are patent. Pituitary anus and anal there is a medial left upper lobe nodule measuring 5 mm image 12/65 which is unchanged. Musculoskeletal: Mild compression deformity of T8 appears chronic and unchanged. T11 and T12 mild compression deformities appear new from prior. Review of the MIP images confirms the above findings. CT ABDOMEN and PELVIS FINDINGS Hepatobiliary: The liver, gallbladder and bile ducts are within normal limits. Pancreas: Unremarkable. No pancreatic ductal  dilatation or surrounding inflammatory  changes. Spleen: Normal in size without focal abnormality. Adrenals/Urinary Tract: There is a punctate calculus in the lower pole the right kidney. Otherwise, kidneys, adrenal glands and bladder are within normal limits. Stomach/Bowel: Stomach is within normal limits. Appendix is not seen. No evidence of bowel wall thickening, distention, or inflammatory changes. Vascular/Lymphatic: Aortic atherosclerosis. No enlarged abdominal or pelvic lymph nodes. Reproductive: Status post hysterectomy. No adnexal masses. Other: No abdominal wall hernia or abnormality. No abdominopelvic ascites. Musculoskeletal: L2 compression deformity is unchanged. Vertebroplasty changes at L3 and L4 unchanged. There is new mild compression deformity of L5 when compared to 04/10/2024. There stable grade 1 anterolisthesis at L5-S1. Review of the MIP images confirms the above findings. IMPRESSION: 1. No evidence for pulmonary embolism. 2. Stable left upper lobectomy changes. 3. Stable 5 mm left upper lobe pulmonary nodule. 4. Stable tree-in-bud opacities in the left lower lobe, likely infectious/inflammatory. 5. New mild compression deformities of T11, T12 and L5. 6. Nonobstructing right renal calculus. Aortic Atherosclerosis (ICD10-I70.0) and Emphysema (ICD10-J43.9). Electronically Signed   By: Greig Pique M.D.   On: 05/12/2024 20:45   CT ABDOMEN PELVIS W CONTRAST Result Date: 05/12/2024 CLINICAL DATA:  High probability for PE. Abdominal and right flank pain. EXAM: CT ANGIOGRAPHY CHEST CT ABDOMEN AND PELVIS WITH CONTRAST TECHNIQUE: Multidetector CT imaging of the chest was performed using the standard protocol during bolus administration of intravenous contrast. Multiplanar CT image reconstructions and MIPs were obtained to evaluate the vascular anatomy. Multidetector CT imaging of the abdomen and pelvis was performed using the standard protocol during bolus administration of intravenous contrast. RADIATION DOSE REDUCTION: This exam was  performed according to the departmental dose-optimization program which includes automated exposure control, adjustment of the mA and/or kV according to patient size and/or use of iterative reconstruction technique. CONTRAST:  80mL OMNIPAQUE  IOHEXOL  350 MG/ML SOLN COMPARISON:  CT renal stone 04/10/2024. CT angiogram chest 01/10/2024. FINDINGS: CTA CHEST FINDINGS Cardiovascular: Satisfactory opacification of the pulmonary arteries to the segmental level. No evidence of pulmonary embolism. Normal heart size. No pericardial effusion. Mediastinum/Nodes: No enlarged mediastinal, hilar, or axillary lymph nodes. Thyroid  gland, trachea, and esophagus demonstrate no significant findings. Lungs/Pleura: Left upper lobectomy changes are again seen. There stable pleural-parenchymal thickening in the upper left hemithorax. Moderate emphysema is again seen. There is bilateral peribronchial wall thickening diffusely. Focal airspace opacities in the posterior right upper lobe have minimally increased in size. There are scattered tree-in-bud opacities throughout the left lower lobe similar to prior. There is no pneumothorax. Trachea and central airways are patent. Pituitary anus and anal there is a medial left upper lobe nodule measuring 5 mm image 12/65 which is unchanged. Musculoskeletal: Mild compression deformity of T8 appears chronic and unchanged. T11 and T12 mild compression deformities appear new from prior. Review of the MIP images confirms the above findings. CT ABDOMEN and PELVIS FINDINGS Hepatobiliary: The liver, gallbladder and bile ducts are within normal limits. Pancreas: Unremarkable. No pancreatic ductal dilatation or surrounding inflammatory changes. Spleen: Normal in size without focal abnormality. Adrenals/Urinary Tract: There is a punctate calculus in the lower pole the right kidney. Otherwise, kidneys, adrenal glands and bladder are within normal limits. Stomach/Bowel: Stomach is within normal limits. Appendix  is not seen. No evidence of bowel wall thickening, distention, or inflammatory changes. Vascular/Lymphatic: Aortic atherosclerosis. No enlarged abdominal or pelvic lymph nodes. Reproductive: Status post hysterectomy. No adnexal masses. Other: No abdominal wall hernia or abnormality. No abdominopelvic ascites. Musculoskeletal: L2 compression deformity is unchanged.  Vertebroplasty changes at L3 and L4 unchanged. There is new mild compression deformity of L5 when compared to 04/10/2024. There stable grade 1 anterolisthesis at L5-S1. Review of the MIP images confirms the above findings. IMPRESSION: 1. No evidence for pulmonary embolism. 2. Stable left upper lobectomy changes. 3. Stable 5 mm left upper lobe pulmonary nodule. 4. Stable tree-in-bud opacities in the left lower lobe, likely infectious/inflammatory. 5. New mild compression deformities of T11, T12 and L5. 6. Nonobstructing right renal calculus. Aortic Atherosclerosis (ICD10-I70.0) and Emphysema (ICD10-J43.9). Electronically Signed   By: Greig Pique M.D.   On: 05/12/2024 20:45    DISCHARGE EXAMINATION: Vitals:   05/20/24 1938 05/20/24 2004 05/21/24 0450 05/21/24 0809  BP:  (!) 141/120 114/79   Pulse:  (!) 108 93   Resp:  16 16   Temp:  98.2 F (36.8 C) (!) 97.3 F (36.3 C)   TempSrc:  Oral Oral   SpO2: 99% 97% 98% 99%  Weight:      Height:       General appearance: Awake alert.  In no distress Resp: Clear to auscultation bilaterally.  Normal effort Cardio: S1-S2 is normal regular.  No S3-S4.  No rubs murmurs or bruit GI: Abdomen is soft.  Nontender nondistended.  Bowel sounds are present normal.  No masses organomegaly  DISPOSITION: SNF  Discharge Instructions     Call MD for:  difficulty breathing, headache or visual disturbances   Complete by: As directed    Call MD for:  extreme fatigue   Complete by: As directed    Call MD for:  persistant dizziness or light-headedness   Complete by: As directed    Call MD for:  persistant  nausea and vomiting   Complete by: As directed    Call MD for:  severe uncontrolled pain   Complete by: As directed    Call MD for:  temperature >100.4   Complete by: As directed    Diet general   Complete by: As directed    Discharge instructions   Complete by: As directed    Please review instructions on the discharge summary.  You were cared for by a hospitalist during your hospital stay. If you have any questions about your discharge medications or the care you received while you were in the hospital after you are discharged, you can call the unit and asked to speak with the hospitalist on call if the hospitalist that took care of you is not available. Once you are discharged, your primary care physician will handle any further medical issues. Please note that NO REFILLS for any discharge medications will be authorized once you are discharged, as it is imperative that you return to your primary care physician (or establish a relationship with a primary care physician if you do not have one) for your aftercare needs so that they can reassess your need for medications and monitor your lab values. If you do not have a primary care physician, you can call [phone] for a physician referral.   Increase activity slowly   Complete by: As directed          Allergies as of 05/21/2024       Reactions   Red Dye #40 (allura Red) Hives, Itching, Other (See Comments)   Red food dye   Strawberry Extract Hives, Itching   Tomato Hives, Itching   Aspirin Hives   Tape Rash, Other (See Comments)   Prefers paper tape   Wound Dressing Adhesive Rash  Medication List     STOP taking these medications    cyclobenzaprine  10 MG tablet Commonly known as: FLEXERIL    morphine  CONCENTRATE 10 mg / 0.5 ml concentrated solution   predniSONE  10 MG tablet Commonly known as: DELTASONE    Voltaren Arthritis Pain 1 % Gel Generic drug: diclofenac  Sodium       TAKE these medications     acetaminophen  325 MG tablet Commonly known as: TYLENOL  Take 2 tablets (650 mg total) by mouth every 6 (six) hours as needed for mild pain (pain score 1-3) or fever (or Fever >/= 101).   alendronate  70 MG tablet Commonly known as: Fosamax  Take 1 tablet (70 mg total) by mouth every 7 (seven) days. Take with a full glass of water  on an empty stomach.   artificial tears ophthalmic solution Place 1 drop into the left eye in the morning, at noon, in the evening, and at bedtime.   Breztri  Aerosphere 160-9-4.8 MCG/ACT Aero inhaler Generic drug: budesonide -glycopyrrolate -formoterol  Inhale 2 puffs into the lungs in the morning, at noon, and at bedtime. What changed: when to take this   calcium -vitamin D  500-5 MG-MCG tablet Commonly known as: OSCAL WITH D Take 2 tablets by mouth 2 (two) times daily.   Eliquis  5 MG Tabs tablet Generic drug: apixaban  Take 1 tablet (5 mg total) by mouth 2 (two) times daily.   feeding supplement Liqd Take 237 mLs by mouth 3 (three) times daily between meals.   gabapentin  300 MG capsule Commonly known as: NEURONTIN  Take 1 capsule (300 mg total) by mouth at bedtime. What changed: when to take this   guaiFENesin  600 MG 12 hr tablet Commonly known as: Mucinex  Take 1 tablet (600 mg total) by mouth 2 (two) times daily. What changed:  when to take this reasons to take this   ipratropium-albuterol  0.5-2.5 (3) MG/3ML Soln Commonly known as: DUONEB Take 3 mLs by nebulization in the morning and at bedtime.   lactulose  10 GM/15ML solution Commonly known as: CHRONULAC  Take 45 mLs (30 g total) by mouth 2 (two) times daily as needed for moderate constipation.   levothyroxine  125 MCG tablet Commonly known as: SYNTHROID  Take 125 mcg by mouth daily before breakfast.   lidocaine  5 % Commonly known as: LIDODERM  Place 3 patches onto the skin daily. Remove & Discard patch within 12 hours or as directed by MD Apply to the most painful areas in the back. What  changed:  how much to take additional instructions   losartan  25 MG tablet Commonly known as: COZAAR  Take 12.5 mg by mouth daily.   methocarbamol  500 MG tablet Commonly known as: ROBAXIN  Take 1 tablet (500 mg total) by mouth every 6 (six) hours as needed for muscle spasms.   metoprolol  succinate 25 MG 24 hr tablet Commonly known as: TOPROL -XL Take 12.5 mg by mouth daily.   naloxone  4 MG/0.1ML Liqd nasal spray kit Commonly known as: NARCAN  Place 1 spray into the nose as needed.   nicotine  14 mg/24hr patch Commonly known as: NICODERM CQ  - dosed in mg/24 hours Place 1 patch (14 mg total) onto the skin daily.   nitroGLYCERIN  0.4 MG SL tablet Commonly known as: NITROSTAT  Place 0.4 mg under the tongue every 5 (five) minutes as needed for chest pain.   Oxycodone  HCl 10 MG Tabs Take 2 tablets (20 mg total) by mouth every 4 (four) hours as needed for severe pain (pain score 7-10). What changed:  medication strength how much to take reasons to take this  oxyCODONE  20 mg 12 hr tablet Commonly known as: OXYCONTIN  Take 1 tablet (20 mg total) by mouth every 12 (twelve) hours. What changed: You were already taking a medication with the same name, and this prescription was added. Make sure you understand how and when to take each.   OXYGEN  Inhale 2 L/min into the lungs continuous.   polyethylene glycol 17 g packet Commonly known as: MIRALAX  / GLYCOLAX  Take 17 g by mouth 2 (two) times daily.   prednisoLONE  acetate 1 % ophthalmic suspension Commonly known as: PRED FORTE  Place 1 drop into the left eye in the morning and at bedtime.   rosuvastatin  20 MG tablet Commonly known as: CRESTOR  Take 20 mg by mouth daily.   senna-docusate 8.6-50 MG tablet Commonly known as: Senokot-S Take 2 tablets by mouth 2 (two) times daily.   sertraline  25 MG tablet Commonly known as: ZOLOFT  Take 25 mg by mouth in the morning.   Ventolin  HFA 108 (90 Base) MCG/ACT inhaler Generic drug:  albuterol  Inhale 2 puffs into the lungs every 6 (six) hours as needed for wheezing or shortness of breath.          Contact information for follow-up providers     [facility] Follow up.   Specialty: Home Health Services Why: Please continue outpatient palliative care services with this provider upon discharge. Contact information: [address] [place] [address] [phone]              Contact information for after-discharge care     Destination     [facility] .   Service: Skilled Nursing Contact information: [address] [place]  [address] [phone]                     TOTAL DISCHARGE TIME: 35 minutes  Patton Rabinovich  [facility] Pager on [email or web address]  05/21/2024, 9:00 AM

## 2024-06-09 ENCOUNTER — Emergency Department (HOSPITAL_COMMUNITY)

## 2024-06-09 ENCOUNTER — Other Ambulatory Visit: Payer: Self-pay

## 2024-06-09 ENCOUNTER — Inpatient Hospital Stay (HOSPITAL_COMMUNITY)
Admission: EM | Admit: 2024-06-09 | Discharge: 2024-06-12 | DRG: 190 | Disposition: A | Discharge status: Home / Self Care | Attending: Family Medicine | Admitting: Family Medicine

## 2024-06-09 ENCOUNTER — Encounter (HOSPITAL_COMMUNITY): Payer: Self-pay | Admitting: Emergency Medicine

## 2024-06-09 DIAGNOSIS — Z1152 Encounter for screening for COVID-19: Secondary | ICD-10-CM

## 2024-06-09 DIAGNOSIS — J441 Chronic obstructive pulmonary disease with (acute) exacerbation: Principal | ICD-10-CM | POA: Diagnosis present

## 2024-06-09 DIAGNOSIS — F4310 Post-traumatic stress disorder, unspecified: Secondary | ICD-10-CM | POA: Diagnosis present

## 2024-06-09 DIAGNOSIS — Z85118 Personal history of other malignant neoplasm of bronchus and lung: Secondary | ICD-10-CM

## 2024-06-09 DIAGNOSIS — F419 Anxiety disorder, unspecified: Secondary | ICD-10-CM | POA: Diagnosis present

## 2024-06-09 DIAGNOSIS — Z9981 Dependence on supplemental oxygen: Secondary | ICD-10-CM

## 2024-06-09 DIAGNOSIS — Z902 Acquired absence of lung [part of]: Secondary | ICD-10-CM

## 2024-06-09 DIAGNOSIS — Z66 Do not resuscitate: Secondary | ICD-10-CM | POA: Diagnosis present

## 2024-06-09 DIAGNOSIS — Z8249 Family history of ischemic heart disease and other diseases of the circulatory system: Secondary | ICD-10-CM

## 2024-06-09 DIAGNOSIS — Z91048 Other nonmedicinal substance allergy status: Secondary | ICD-10-CM

## 2024-06-09 DIAGNOSIS — I10 Essential (primary) hypertension: Secondary | ICD-10-CM | POA: Diagnosis present

## 2024-06-09 DIAGNOSIS — R0902 Hypoxemia: Secondary | ICD-10-CM

## 2024-06-09 DIAGNOSIS — F17210 Nicotine dependence, cigarettes, uncomplicated: Secondary | ICD-10-CM | POA: Diagnosis present

## 2024-06-09 DIAGNOSIS — Z7983 Long term (current) use of bisphosphonates: Secondary | ICD-10-CM

## 2024-06-09 DIAGNOSIS — Z9102 Food additives allergy status: Secondary | ICD-10-CM

## 2024-06-09 DIAGNOSIS — I252 Old myocardial infarction: Secondary | ICD-10-CM

## 2024-06-09 DIAGNOSIS — Z87442 Personal history of urinary calculi: Secondary | ICD-10-CM

## 2024-06-09 DIAGNOSIS — Z7951 Long term (current) use of inhaled steroids: Secondary | ICD-10-CM

## 2024-06-09 DIAGNOSIS — Z79890 Hormone replacement therapy (postmenopausal): Secondary | ICD-10-CM

## 2024-06-09 DIAGNOSIS — Z91018 Allergy to other foods: Secondary | ICD-10-CM

## 2024-06-09 DIAGNOSIS — I480 Paroxysmal atrial fibrillation: Secondary | ICD-10-CM | POA: Diagnosis present

## 2024-06-09 DIAGNOSIS — J9621 Acute and chronic respiratory failure with hypoxia: Secondary | ICD-10-CM | POA: Diagnosis present

## 2024-06-09 DIAGNOSIS — E785 Hyperlipidemia, unspecified: Secondary | ICD-10-CM | POA: Diagnosis present

## 2024-06-09 DIAGNOSIS — R0602 Shortness of breath: Secondary | ICD-10-CM

## 2024-06-09 DIAGNOSIS — M4856XA Collapsed vertebra, not elsewhere classified, lumbar region, initial encounter for fracture: Secondary | ICD-10-CM | POA: Diagnosis present

## 2024-06-09 DIAGNOSIS — E890 Postprocedural hypothyroidism: Secondary | ICD-10-CM | POA: Diagnosis present

## 2024-06-09 DIAGNOSIS — Z7901 Long term (current) use of anticoagulants: Secondary | ICD-10-CM

## 2024-06-09 DIAGNOSIS — Z79899 Other long term (current) drug therapy: Secondary | ICD-10-CM

## 2024-06-09 DIAGNOSIS — F319 Bipolar disorder, unspecified: Secondary | ICD-10-CM | POA: Diagnosis present

## 2024-06-09 DIAGNOSIS — Z886 Allergy status to analgesic agent status: Secondary | ICD-10-CM

## 2024-06-09 DIAGNOSIS — Z90710 Acquired absence of both cervix and uterus: Secondary | ICD-10-CM

## 2024-06-09 LAB — CBC WITH DIFFERENTIAL/PLATELET
Abs Immature Granulocytes: 0.06 K/uL (ref 0.00–0.07)
Basophils Absolute: 0.1 K/uL (ref 0.0–0.1)
Basophils Relative: 1 %
Eosinophils Absolute: 0 K/uL (ref 0.0–0.5)
Eosinophils Relative: 0 %
HCT: 38.9 % (ref 36.0–46.0)
Hemoglobin: 11.9 g/dL — ABNORMAL LOW (ref 12.0–15.0)
Immature Granulocytes: 1 %
Lymphocytes Relative: 11 %
Lymphs Abs: 1.4 K/uL (ref 0.7–4.0)
MCH: 27.5 pg (ref 26.0–34.0)
MCHC: 30.6 g/dL (ref 30.0–36.0)
MCV: 90 fL (ref 80.0–100.0)
Monocytes Absolute: 0.7 K/uL (ref 0.1–1.0)
Monocytes Relative: 6 %
Neutro Abs: 10 K/uL — ABNORMAL HIGH (ref 1.7–7.7)
Neutrophils Relative %: 81 %
Platelets: 246 K/uL (ref 150–400)
RBC: 4.32 MIL/uL (ref 3.87–5.11)
RDW: 13.5 % (ref 11.5–15.5)
WBC: 12.2 K/uL — ABNORMAL HIGH (ref 4.0–10.5)
nRBC: 0 % (ref 0.0–0.2)

## 2024-06-09 LAB — URINALYSIS, W/ REFLEX TO CULTURE (INFECTION SUSPECTED)
Bacteria, UA: NONE SEEN
Bilirubin Urine: NEGATIVE
Glucose, UA: NEGATIVE mg/dL
Hgb urine dipstick: NEGATIVE
Ketones, ur: NEGATIVE mg/dL
Leukocytes,Ua: NEGATIVE
Nitrite: NEGATIVE
Protein, ur: NEGATIVE mg/dL
Specific Gravity, Urine: 1.011 (ref 1.005–1.030)
pH: 5 (ref 5.0–8.0)

## 2024-06-09 LAB — I-STAT CG4 LACTIC ACID, ED: Lactic Acid, Venous: 1.4 mmol/L (ref 0.5–1.9)

## 2024-06-09 LAB — COMPREHENSIVE METABOLIC PANEL WITH GFR
ALT: 14 U/L (ref 0–44)
AST: 22 U/L (ref 15–41)
Albumin: 4.3 g/dL (ref 3.5–5.0)
Alkaline Phosphatase: 93 U/L (ref 38–126)
Anion gap: 12 (ref 5–15)
BUN: 11 mg/dL (ref 8–23)
CO2: 27 mmol/L (ref 22–32)
Calcium: 9.9 mg/dL (ref 8.9–10.3)
Chloride: 102 mmol/L (ref 98–111)
Creatinine, Ser: 0.52 mg/dL (ref 0.44–1.00)
GFR, Estimated: >60 mL/min (ref >60)
Glucose, Bld: 115 mg/dL — ABNORMAL HIGH (ref 70–99)
Potassium: 4.1 mmol/L (ref 3.5–5.1)
Sodium: 141 mmol/L (ref 135–145)
Total Bilirubin: <0.2 mg/dL (ref 0.0–1.2)
Total Protein: 7.3 g/dL (ref 6.5–8.1)

## 2024-06-09 LAB — LIPASE, BLOOD: Lipase: 12 U/L (ref 11–51)

## 2024-06-09 LAB — RESP PANEL BY RT-PCR (RSV, FLU A&B, COVID)  RVPGX2
Influenza A by PCR: NEGATIVE
Influenza B by PCR: NEGATIVE
Resp Syncytial Virus by PCR: NEGATIVE
SARS Coronavirus 2 by RT PCR: NEGATIVE

## 2024-06-09 LAB — TROPONIN T, HIGH SENSITIVITY: Troponin T High Sensitivity: 16 ng/L (ref 0–19)

## 2024-06-09 MED ORDER — METHYLPREDNISOLONE SODIUM SUCC 125 MG IJ SOLR
125.0000 mg | Freq: Once | INTRAMUSCULAR | Status: AC
  Administered 2024-06-09: 125 mg via INTRAVENOUS
  Filled 2024-06-09: qty 2

## 2024-06-09 MED ORDER — ENOXAPARIN SODIUM 40 MG/0.4ML IJ SOSY
40.0000 mg | PREFILLED_SYRINGE | Freq: Every day | INTRAMUSCULAR | Status: DC
Start: 2024-06-10 — End: 2024-06-10
  Administered 2024-06-10: 40 mg via SUBCUTANEOUS
  Filled 2024-06-09: qty 0.4

## 2024-06-09 MED ORDER — MAGNESIUM SULFATE 2 GM/50ML IV SOLN
2.0000 g | Freq: Once | INTRAVENOUS | Status: AC
  Administered 2024-06-09: 2 g via INTRAVENOUS
  Filled 2024-06-09: qty 50

## 2024-06-09 MED ORDER — IPRATROPIUM-ALBUTEROL 0.5-2.5 (3) MG/3ML IN SOLN
3.0000 mL | Freq: Four times a day (QID) | RESPIRATORY_TRACT | Status: DC
Start: 2024-06-10 — End: 2024-06-10
  Administered 2024-06-10 (×3): 3 mL via RESPIRATORY_TRACT
  Filled 2024-06-09 (×3): qty 3

## 2024-06-09 MED ORDER — IPRATROPIUM-ALBUTEROL 0.5-2.5 (3) MG/3ML IN SOLN
3.0000 mL | Freq: Once | RESPIRATORY_TRACT | Status: AC
  Administered 2024-06-09: 3 mL via RESPIRATORY_TRACT
  Filled 2024-06-09: qty 3

## 2024-06-09 MED ORDER — IPRATROPIUM-ALBUTEROL 0.5-2.5 (3) MG/3ML IN SOLN: 3.0000 mL | RESPIRATORY_TRACT | Status: DC | PRN

## 2024-06-09 NOTE — ED Provider Notes (Signed)
 [place] EMERGENCY DEPARTMENT AT [facility] Provider Note   CSN: [id number] Arrival date & time: 06/09/24  2109     Patient presents with: No chief complaint on file.   Tracy Park is a 68 y.o. female.   The history is provided by the patient and medical records. No language interpreter was used.  Shortness of Breath Severity:  Severe Onset quality:  Gradual Duration:  1 day Timing:  Constant Progression:  Improving Chronicity:  Recurrent Context: smoke exposure (smoking today) and URI   Relieved by:  Nothing Worsened by:  Nothing Ineffective treatments:  None tried Associated symptoms: cough, vomiting and wheezing   Associated symptoms: no abdominal pain, no chest pain, no fever, no headaches, no hemoptysis, no neck pain and no rash   Risk factors: hx of cancer   Risk factors: no hx of PE/DVT        Prior to Admission medications   Medication Sig Start Date End Date Taking? Authorizing Provider  acetaminophen  (TYLENOL ) 325 MG tablet Take 2 tablets (650 mg total) by mouth every 6 (six) hours as needed for mild pain (pain score 1-3) or fever (or Fever >/= 101). 05/21/24   Verdene Purchase, MD  albuterol  (VENTOLIN  HFA) 108 [phone] Base) MCG/ACT inhaler Inhale 2 puffs into the lungs every 6 (six) hours as needed for wheezing or shortness of breath. 03/08/24   Jillian Buttery, MD  alendronate  (FOSAMAX ) 70 MG tablet Take 1 tablet (70 mg total) by mouth every 7 (seven) days. Take with a full glass of water  on an empty stomach. 05/19/24 05/19/25  Krishnan, Gokul, MD  apixaban  (ELIQUIS ) 5 MG TABS tablet Take 1 tablet (5 mg total) by mouth 2 (two) times daily. 06/06/23   Barbarann Nest, MD  budesonide -glycopyrrolate -formoterol  (BREZTRI  AEROSPHERE) 160-9-4.8 MCG/ACT AERO inhaler Inhale 2 puffs into the lungs in the morning, at noon, and at bedtime. Patient taking differently: Inhale 2 puffs into the lungs in the morning and at bedtime. 03/08/24   Jillian Buttery, MD   calcium -vitamin D  (OSCAL WITH D) 500-5 MG-MCG tablet Take 2 tablets by mouth 2 (two) times daily. 05/21/24   Krishnan, Gokul, MD  feeding supplement (ENSURE PLUS HIGH PROTEIN) LIQD Take 237 mLs by mouth 3 (three) times daily between meals. 05/21/24   Krishnan, Gokul, MD  gabapentin  (NEURONTIN ) 300 MG capsule Take 1 capsule (300 mg total) by mouth at bedtime. 05/21/24   Krishnan, Gokul, MD  guaiFENesin  (MUCINEX ) 600 MG 12 hr tablet Take 1 tablet (600 mg total) by mouth 2 (two) times daily. Patient taking differently: Take 600 mg by mouth 2 (two) times daily as needed for cough or to loosen phlegm. 03/08/24 03/08/25  Jillian Buttery, MD  ipratropium-albuterol  (DUONEB) 0.5-2.5 (3) MG/3ML SOLN Take 3 mLs by nebulization in the morning and at bedtime. 02/29/24   [provider]  lactulose  (CHRONULAC ) 10 GM/15ML solution Take 45 mLs (30 g total) by mouth 2 (two) times daily as needed for moderate constipation. 05/21/24   Krishnan, Gokul, MD  levothyroxine  (SYNTHROID ) 125 MCG tablet Take 125 mcg by mouth daily before breakfast.    [provider]  lidocaine  (LIDODERM ) 5 % Place 3 patches onto the skin daily. Remove & Discard patch within 12 hours or as directed by MD Apply to the most painful areas in the back. 05/21/24   Krishnan, Gokul, MD  losartan  (COZAAR ) 25 MG tablet Take 12.5 mg by mouth daily. 02/09/24   [provider]  methocarbamol  (ROBAXIN ) 500 MG tablet Take  1 tablet (500 mg total) by mouth every 6 (six) hours as needed for muscle spasms. 05/21/24   Krishnan, Gokul, MD  metoprolol  succinate (TOPROL -XL) 25 MG 24 hr tablet Take 12.5 mg by mouth daily.    [provider]  naloxone  (NARCAN ) nasal spray 4 mg/0.1 mL Place 1 spray into the nose as needed. 05/21/24   Krishnan, Gokul, MD  nicotine  (NICODERM CQ  - DOSED IN MG/24 HOURS) 14 mg/24hr patch Place 1 patch (14 mg total) onto the skin daily. 05/21/24   Krishnan, Gokul, MD  nitroGLYCERIN  (NITROSTAT ) 0.4 MG SL tablet Place  0.4 mg under the tongue every 5 (five) minutes as needed for chest pain.    [provider]  oxyCODONE  (OXYCONTIN ) 20 mg 12 hr tablet Take 1 tablet (20 mg total) by mouth every 12 (twelve) hours. 05/21/24   Krishnan, Gokul, MD  oxyCODONE  10 MG TABS Take 2 tablets (20 mg total) by mouth every 4 (four) hours as needed for severe pain (pain score 7-10). 05/21/24   Verdene Purchase, MD  OXYGEN  Inhale 2 L/min into the lungs continuous.    [provider]  polyethylene glycol (MIRALAX  / GLYCOLAX ) 17 g packet Take 17 g by mouth 2 (two) times daily. 05/21/24   Krishnan, Gokul, MD  polyvinyl alcohol  (LIQUIFILM TEARS) 1.4 % ophthalmic solution Place 1 drop into the left eye in the morning, at noon, in the evening, and at bedtime.    [provider]  prednisoLONE  acetate (PRED FORTE ) 1 % ophthalmic suspension Place 1 drop into the left eye in the morning and at bedtime. 11/19/23   [provider]  rosuvastatin  (CRESTOR ) 20 MG tablet Take 20 mg by mouth daily.    [provider]  senna-docusate (SENOKOT-S) 8.6-50 MG tablet Take 2 tablets by mouth 2 (two) times daily. 05/21/24   Krishnan, Gokul, MD  sertraline  (ZOLOFT ) 25 MG tablet Take 25 mg by mouth in the morning.    [provider]    Allergies: Red dye #40 (allura red), Strawberry extract, Tomato, Aspirin, Tape, and Wound dressing adhesive    Review of Systems  Constitutional:  Positive for chills and fatigue. Negative for fever.  HENT:  Negative for congestion.   Respiratory:  Positive for cough, chest tightness, shortness of breath and wheezing. Negative for hemoptysis and stridor.   Cardiovascular:  Negative for chest pain, palpitations and leg swelling.  Gastrointestinal:  Positive for nausea and vomiting. Negative for abdominal pain, constipation and diarrhea.  Genitourinary:  Negative for dysuria.  Musculoskeletal:  Positive for back pain. Negative for gait problem, neck pain and neck stiffness.   Skin:  Negative for rash and wound.  Neurological:  Negative for headaches.  Psychiatric/Behavioral:  Negative for agitation.   All other systems reviewed and are negative.   Updated Vital Signs BP 113/85 (BP Location: Left Arm)   Pulse (!) 103   Temp 98.1 F (36.7 C) (Oral)   Resp 18   Ht 5' 3 (1.6 m)   Wt 53.5 kg   SpO2 99%   BMI 20.90 kg/m   Physical Exam Vitals and nursing note reviewed.  Constitutional:      General: She is not in acute distress.    Appearance: She is well-developed. She is not ill-appearing, toxic-appearing or diaphoretic.  HENT:     Head: Normocephalic and atraumatic.     Nose: No congestion or rhinorrhea.     Mouth/Throat:     Mouth: Mucous membranes are moist.  Pharynx: No oropharyngeal exudate or posterior oropharyngeal erythema.  Eyes:     Extraocular Movements: Extraocular movements intact.     Conjunctiva/sclera: Conjunctivae normal.     Pupils: Pupils are equal, round, and reactive to light.  Cardiovascular:     Rate and Rhythm: Regular rhythm. Tachycardia present.     Heart sounds: Murmur heard.  Pulmonary:     Effort: No respiratory distress.     Breath sounds: Wheezing and rhonchi present. No rales.  Chest:     Chest wall: No tenderness.  Abdominal:     General: Abdomen is flat.     Palpations: Abdomen is soft.     Tenderness: There is no abdominal tenderness. There is no guarding or rebound.  Musculoskeletal:        General: No swelling or tenderness.     Cervical back: Neck supple.     Right lower leg: No edema.     Left lower leg: No edema.  Skin:    General: Skin is warm and dry.     Capillary Refill: Capillary refill takes less than 2 seconds.     Findings: No erythema or rash.  Neurological:     General: No focal deficit present.     Mental Status: She is alert.  Psychiatric:        Mood and Affect: Mood normal.     (all labs ordered are listed, but only abnormal results are displayed) Labs Reviewed  CBC  WITH DIFFERENTIAL/PLATELET - Abnormal; Notable for the following components:      Result Value   WBC 12.2 (*)    Hemoglobin 11.9 (*)    Neutro Abs 10.0 (*)    All other components within normal limits  COMPREHENSIVE METABOLIC PANEL WITH GFR - Abnormal; Notable for the following components:   Glucose, Bld 115 (*)    All other components within normal limits  RESP PANEL BY RT-PCR (RSV, FLU A&B, COVID)  RVPGX2  LIPASE, BLOOD  URINALYSIS, W/ REFLEX TO CULTURE (INFECTION SUSPECTED)  I-STAT CG4 LACTIC ACID, ED  TROPONIN T, HIGH SENSITIVITY  TROPONIN T, HIGH SENSITIVITY    EKG: None  Radiology: DG Chest Portable 1 View Result Date: 06/09/2024 CLINICAL DATA:  Short of breath, hypoxia, history of COPD EXAM: PORTABLE CHEST 1 VIEW COMPARISON:  05/16/2024 FINDINGS: Single frontal view of the chest demonstrates a stable cardiac silhouette. Postsurgical changes from partial left pneumonectomy, with stable left apical pleural and parenchymal scarring. No acute airspace disease, effusion, or pneumothorax. No acute bony abnormalities. IMPRESSION: 1. Stable left apical pleural and parenchymal scarring. 2. No acute airspace disease. Electronically Signed   By: Ozell Daring M.D.   On: 06/09/2024 21:56     Procedures   Medications Ordered in the ED  magnesium  sulfate IVPB 2 g 50 mL (2 g Intravenous New Bag/Given 06/09/24 2232)  ipratropium-albuterol  (DUONEB) 0.5-2.5 (3) MG/3ML nebulizer solution 3 mL (3 mLs Nebulization Given 06/09/24 2232)  methylPREDNISolone  sodium succinate (SOLU-MEDROL ) 125 mg/2 mL injection 125 mg (125 mg Intravenous Given 06/09/24 2227)                                    Medical Decision Making Amount and/or Complexity of Data Reviewed Labs: ordered. Radiology: ordered.  Risk Prescription drug management. Decision regarding hospitalization.    Tracy Park is a 68 y.o. female with a past medical history significant for COPD with 2 L home oxygen   at baseline,  paroxysmal atrial fibrillation on Eliquis  therapy, previous lung cancer, previous CAD with MI, PTSD, bipolar disorder, anxiety, depression, and hypothyroidism who presents with respiratory difficulty.  According to patient, she has been admitted recently for COPD exacerbation and then was at [facility] for quite some time and just mated home today at discharge.  She reports that as soon as she got home, she smoked 2 cigarettes and subsequently started having respiratory difficulties.  She said that her concentrator was drying but she is worried that the tubing and machine were not strong enough for her and when she called EMS her oxygen  saturations were in the 70s and did not rise from the 80s initially.  She was wheezing all over and was having tightness across her chest but denied any stabbing or sharp chest pain.  Otherwise she reports no fevers but has had some chills and some cough.  She reports some nausea and dry heaving and has also had some recent diarrhea.  Denies any leg pain or leg swelling and denies history of blood clots.  She denies other complaints.  EMS reportedly gave her a breathing treatment but could not get IV access further medications.  Oxygen  saturations are now in the 90s on 3 L nasal cannula.  On my exam, patient is still wheezing in all lung fields and is very tight.  No rales but did have some faint rhonchi in the bases.  She does have a slight murmur but chest was nontender.  Abdomen nontender.  Legs nontender and nonedematous.  Patient still breathing quickly.  Clinically I suspect COPD exacerbation versus viral illness with his cough and chills that caused hypoxia.  I also suspect that the smoking likely exacerbated her breathing as she has not been able to smoke recently while she was in the hospital and at rehab.  Given the oxygen  saturations in the 70s on her home oxygen  management, anticipate she will need admission.  Will give breathing treatment, steroids and  magnesium  and will also start a workup with labs, x-ray, and make sure she does not have COVID as she thinks there may have been some of that in her rehab facility.  Anticipate admission after workup is completed for further management.  X-rays not show pneumonia.  Labs otherwise reassuring however given that worsened oxygen  requirement, leukocytosis, and her rest were difficulties, do not feel she is safe for discharge home.  Patient will be mated for COPD exacerbation and further management.      Final diagnoses:  COPD exacerbation (HCC)  Hypoxia  SOB (shortness of breath)    ED Discharge Orders     None       Clinical Impression: 1. COPD exacerbation (HCC)   2. Hypoxia   3. SOB (shortness of breath)     Disposition: Admit  This note was prepared with assistance of Dragon voice recognition software. Occasional wrong-word or sound-a-like substitutions may have occurred due to the inherent limitations of voice recognition software.      Waddell Iten, Lonni PARAS, MD 06/09/24 [phone]

## 2024-06-09 NOTE — ED Triage Notes (Signed)
 Patient BIB EMS for evaluation of SHOB.  Reports she was discharged home from [facility].  On the way home patient smoked multiple cigarettes and became short of breath.  Given DuoNeb x 1 by EMS.  Wheezing resolved.  At baseline, patient wears 2 L [place]

## 2024-06-10 DIAGNOSIS — Z9981 Dependence on supplemental oxygen: Secondary | ICD-10-CM | POA: Diagnosis not present

## 2024-06-10 DIAGNOSIS — Z7901 Long term (current) use of anticoagulants: Secondary | ICD-10-CM | POA: Diagnosis not present

## 2024-06-10 DIAGNOSIS — Z79890 Hormone replacement therapy (postmenopausal): Secondary | ICD-10-CM | POA: Diagnosis not present

## 2024-06-10 DIAGNOSIS — Z902 Acquired absence of lung [part of]: Secondary | ICD-10-CM | POA: Diagnosis not present

## 2024-06-10 DIAGNOSIS — M4856XA Collapsed vertebra, not elsewhere classified, lumbar region, initial encounter for fracture: Secondary | ICD-10-CM | POA: Diagnosis present

## 2024-06-10 DIAGNOSIS — J9621 Acute and chronic respiratory failure with hypoxia: Secondary | ICD-10-CM | POA: Diagnosis present

## 2024-06-10 DIAGNOSIS — F319 Bipolar disorder, unspecified: Secondary | ICD-10-CM | POA: Diagnosis present

## 2024-06-10 DIAGNOSIS — Z87442 Personal history of urinary calculi: Secondary | ICD-10-CM | POA: Diagnosis not present

## 2024-06-10 DIAGNOSIS — Z1152 Encounter for screening for COVID-19: Secondary | ICD-10-CM | POA: Diagnosis not present

## 2024-06-10 DIAGNOSIS — Z79899 Other long term (current) drug therapy: Secondary | ICD-10-CM | POA: Diagnosis not present

## 2024-06-10 DIAGNOSIS — Z90710 Acquired absence of both cervix and uterus: Secondary | ICD-10-CM | POA: Diagnosis not present

## 2024-06-10 DIAGNOSIS — I252 Old myocardial infarction: Secondary | ICD-10-CM | POA: Diagnosis not present

## 2024-06-10 DIAGNOSIS — Z8249 Family history of ischemic heart disease and other diseases of the circulatory system: Secondary | ICD-10-CM | POA: Diagnosis not present

## 2024-06-10 DIAGNOSIS — Z85118 Personal history of other malignant neoplasm of bronchus and lung: Secondary | ICD-10-CM | POA: Diagnosis not present

## 2024-06-10 DIAGNOSIS — E890 Postprocedural hypothyroidism: Secondary | ICD-10-CM | POA: Diagnosis present

## 2024-06-10 DIAGNOSIS — F17210 Nicotine dependence, cigarettes, uncomplicated: Secondary | ICD-10-CM | POA: Diagnosis present

## 2024-06-10 DIAGNOSIS — F4310 Post-traumatic stress disorder, unspecified: Secondary | ICD-10-CM | POA: Diagnosis present

## 2024-06-10 DIAGNOSIS — E785 Hyperlipidemia, unspecified: Secondary | ICD-10-CM | POA: Diagnosis present

## 2024-06-10 DIAGNOSIS — J441 Chronic obstructive pulmonary disease with (acute) exacerbation: Secondary | ICD-10-CM | POA: Diagnosis present

## 2024-06-10 DIAGNOSIS — F419 Anxiety disorder, unspecified: Secondary | ICD-10-CM | POA: Diagnosis present

## 2024-06-10 DIAGNOSIS — R0602 Shortness of breath: Secondary | ICD-10-CM | POA: Diagnosis present

## 2024-06-10 DIAGNOSIS — I10 Essential (primary) hypertension: Secondary | ICD-10-CM | POA: Diagnosis present

## 2024-06-10 DIAGNOSIS — I480 Paroxysmal atrial fibrillation: Secondary | ICD-10-CM | POA: Diagnosis present

## 2024-06-10 DIAGNOSIS — Z66 Do not resuscitate: Secondary | ICD-10-CM | POA: Diagnosis present

## 2024-06-10 DIAGNOSIS — Z7983 Long term (current) use of bisphosphonates: Secondary | ICD-10-CM | POA: Diagnosis not present

## 2024-06-10 LAB — CBC
HCT: 36.7 % (ref 36.0–46.0)
Hemoglobin: 11.6 g/dL — ABNORMAL LOW (ref 12.0–15.0)
MCH: 28.6 pg (ref 26.0–34.0)
MCHC: 31.6 g/dL (ref 30.0–36.0)
MCV: 90.6 fL (ref 80.0–100.0)
Platelets: 229 K/uL (ref 150–400)
RBC: 4.05 MIL/uL (ref 3.87–5.11)
RDW: 13.5 % (ref 11.5–15.5)
WBC: 9.6 K/uL (ref 4.0–10.5)
nRBC: 0 % (ref 0.0–0.2)

## 2024-06-10 LAB — TROPONIN T, HIGH SENSITIVITY
Troponin T High Sensitivity: <15 ng/L (ref 0–19)
Troponin T High Sensitivity: <15 ng/L (ref 0–19)

## 2024-06-10 LAB — TSH: TSH: 0.523 u[IU]/mL (ref 0.350–4.500)

## 2024-06-10 LAB — HIV ANTIBODY (ROUTINE TESTING W REFLEX): HIV Screen 4th Generation wRfx: NONREACTIVE

## 2024-06-10 LAB — MAGNESIUM: Magnesium: 2.4 mg/dL (ref 1.7–2.4)

## 2024-06-10 LAB — I-STAT CG4 LACTIC ACID, ED: Lactic Acid, Venous: 0.5 mmol/L (ref 0.5–1.9)

## 2024-06-10 LAB — PHOSPHORUS: Phosphorus: 2.9 mg/dL (ref 2.5–4.6)

## 2024-06-10 MED ORDER — POLYETHYLENE GLYCOL 3350 17 G PO PACK: 17.0000 g | PACK | Freq: Every day | ORAL | Status: DC | PRN

## 2024-06-10 MED ORDER — INFLUENZA VAC SPLIT HIGH-DOSE 0.5 ML IM SUSY
0.5000 mL | PREFILLED_SYRINGE | INTRAMUSCULAR | Status: DC
  Filled 2024-06-10: qty 0.5

## 2024-06-10 MED ORDER — ACETAMINOPHEN 325 MG PO TABS
650.0000 mg | ORAL_TABLET | Freq: Four times a day (QID) | ORAL | Status: DC | PRN
  Administered 2024-06-10 – 2024-06-11 (×3): 650 mg via ORAL
  Filled 2024-06-10 (×3): qty 2

## 2024-06-10 MED ORDER — ENOXAPARIN SODIUM 40 MG/0.4ML IJ SOSY: 40.0000 mg | PREFILLED_SYRINGE | INTRAMUSCULAR | Status: DC

## 2024-06-10 MED ORDER — SODIUM CHLORIDE 0.9 % IV SOLN
500.0000 mg | Freq: Every day | INTRAVENOUS | Status: DC
  Administered 2024-06-10 – 2024-06-11 (×3): 500 mg via INTRAVENOUS
  Filled 2024-06-10 (×4): qty 5

## 2024-06-10 MED ORDER — OXYCODONE HCL 5 MG PO TABS
5.0000 mg | ORAL_TABLET | Freq: Four times a day (QID) | ORAL | Status: DC | PRN
  Administered 2024-06-10 – 2024-06-12 (×6): 5 mg via ORAL
  Filled 2024-06-10 (×6): qty 1

## 2024-06-10 MED ORDER — APIXABAN 5 MG PO TABS
5.0000 mg | ORAL_TABLET | Freq: Two times a day (BID) | ORAL | Status: DC
Start: 2024-06-10 — End: 2024-06-12
  Administered 2024-06-10 – 2024-06-12 (×5): 5 mg via ORAL
  Filled 2024-06-10 (×3): qty 1
  Filled 2024-06-10: qty 2
  Filled 2024-06-10: qty 1

## 2024-06-10 MED ORDER — MELATONIN 5 MG PO TABS: 5.0000 mg | ORAL_TABLET | Freq: Every evening | ORAL | Status: DC | PRN

## 2024-06-10 MED ORDER — METHYLPREDNISOLONE SODIUM SUCC 40 MG IJ SOLR
40.0000 mg | Freq: Every day | INTRAMUSCULAR | Status: AC
  Administered 2024-06-10 – 2024-06-12 (×3): 40 mg via INTRAVENOUS
  Filled 2024-06-10 (×3): qty 1

## 2024-06-10 MED ORDER — PROCHLORPERAZINE EDISYLATE 10 MG/2ML IJ SOLN: 5.0000 mg | Freq: Four times a day (QID) | INTRAMUSCULAR | Status: DC | PRN

## 2024-06-10 MED ORDER — IPRATROPIUM-ALBUTEROL 0.5-2.5 (3) MG/3ML IN SOLN
3.0000 mL | Freq: Three times a day (TID) | RESPIRATORY_TRACT | Status: DC
  Administered 2024-06-10 – 2024-06-12 (×6): 3 mL via RESPIRATORY_TRACT
  Filled 2024-06-10 (×6): qty 3

## 2024-06-10 NOTE — Plan of Care (Signed)

## 2024-06-10 NOTE — H&P (Signed)
 History and Physical    Tracy Park FMW:[id number] DOB: 1956-05-21 DOA: 06/09/2024  PCP: Corlis Longs, FNP  Patient coming from: Home  I have personally briefly reviewed patient's old medical records in [facility]  Chief Complaint: Worsening shortness of breath  HPI: Tracy Park is a 68 y.o. female with medical history significant of COPD, chronic hypoxemic respiratory failure on 2 L of oxygen  via nasal cannula, adenocarcinoma of lung status post left upper lobectomy, paroxysmal A-fib on Eliquis , hypothyroidism, anxiety, PTSD presented with worsening shortness of breath.  Patient reports that she has been admitted for compression fracture of T8 vertebrae and discharged SN[facility] on 05/21/2024 and was discharged home yesterday.  Diet as soon as reports she got home, she smoked 2-1/2 cigarettes and subsequently she started having respiratory difficulty, chest tightness and wheezing.  She called EMS and her oxygen  saturation was noted to be in 70 that improved to 80s initially.  She was given breathing treatment and her oxygen  saturation improved to 90s on 3 L of oxygen  via nasal cannula.  Patient reports chronic cough, which is dry, shortness of breath, wheezing, chest tightness, chills, not feeling well overall.  She denies headache, blurry vision, lightheadedness, dizziness,  Lives with her sister and also her daughter lives close by.  She is on 2 L of oxygen  via nasal cannula at home.  ED Course: Upon arrival to ED: Patient afebrile, tachycardic, RR: 18, BP: 116/89, maintaining oxygen  saturation 96% on 2 L of oxygen  via nasal cannula.  Tested negative for COVID flu and RSV.  CBC shows leukocytosis of 12.2, H&H 11.9/38.9, PLT: 246.  NA: 141, K: 4.1, BUN 11, CR: 0.52, normal liver enzymes.  Troponin x 3 negative.  Lipase: WNL.  Chest x-ray shows no pneumonia.  Patient was given Solu-Medrol , breathing treatment in ER.  EDP requested admission for COPD exacerbation.  Review of  Systems: As per HPI otherwise negative.    Past Medical History:  Diagnosis Date   Adenocarcinoma of lung (HCC) 02/26/2022   Anginal pain    Anxiety    Bipolar disorder (HCC)    Cancer (HCC)    COPD (chronic obstructive pulmonary disease) (HCC)    Dyspnea    Family history of adverse reaction to anesthesia    History of depression 08/03/2023   History of kidney stones    Hydroureteronephrosis 08/16/2021   Hypothyroidism    Lung cancer (HCC)    Malignant neoplasm of upper lobe of left lung (HCC) 11/12/2016   Myocardial infarction (HCC)    Paroxysmal atrial fibrillation (HCC)    Paroxysmal atrial fibrillation with RVR (HCC) 01/25/2023   PTSD (post-traumatic stress disorder)    Rib pain on left side 06/24/2023   Sleep apnea    Thyroid  disease     Past Surgical History:  Procedure Laterality Date   ABDOMINAL HYSTERECTOMY     BACK SURGERY     CYSTOSCOPY W/ URETERAL STENT PLACEMENT Right 05/03/2021   Procedure: CYSTOSCOPY WITH RETROGRADE PYELOGRAM/URETERAL STENT PLACEMENT;  Surgeon: Cam Morene ORN, MD;  Location: [facility];  Service: Urology;  Laterality: Right;   EYE SURGERY     IR KYPHO EA ADDL LEVEL THORACIC OR LUMBAR  03/17/2024   IR KYPHO EA ADDL LEVEL THORACIC OR LUMBAR  03/17/2024   kidney stent     RADIOLOGY WITH ANESTHESIA N/A 05/17/2024   Procedure: MRI WITH ANESTHESIA;  Surgeon: Radiologist, Medication, MD;  Location: [facility] OR;  Service: Radiology;  Laterality: N/A;   thyroidectomy  reports that she has been smoking cigarettes. She has never used smokeless tobacco. She reports that she does not currently use alcohol . She reports that she does not currently use drugs.  Allergies  Allergen Reactions   Red Dye #40 (Allura Red) Hives, Itching and Other (See Comments)    Red food dye   Strawberry Extract Hives and Itching   Tomato Hives and Itching   Aspirin Hives   Tape Rash and Other (See Comments)    Prefers paper tape   Wound Dressing Adhesive Rash    Family  History  Problem Relation Age of Onset   Hypertension Mother    Heart failure Mother    Hypertension Father    Diabetes Father    Heart failure Father     Prior to Admission medications   Medication Sig Start Date End Date Taking? Authorizing Provider  alendronate  (FOSAMAX ) 70 MG tablet Take 1 tablet (70 mg total) by mouth every 7 (seven) days. Take with a full glass of water  on an empty stomach. 05/19/24 05/19/25 Yes Verdene Purchase, MD  Oxycodone  HCl 20 MG TABS Take 1 tablet by mouth 2 (two) times daily as needed. 06/09/24  Yes [provider]  acetaminophen  (TYLENOL ) 325 MG tablet Take 2 tablets (650 mg total) by mouth every 6 (six) hours as needed for mild pain (pain score 1-3) or fever (or Fever >/= 101). 05/21/24   Verdene Purchase, MD  albuterol  (VENTOLIN  HFA) 108 [id number] Base) MCG/ACT inhaler Inhale 2 puffs into the lungs every 6 (six) hours as needed for wheezing or shortness of breath. 03/08/24   Jillian Buttery, MD  apixaban  (ELIQUIS ) 5 MG TABS tablet Take 1 tablet (5 mg total) by mouth 2 (two) times daily. 06/06/23   Barbarann Nest, MD  budesonide -glycopyrrolate -formoterol  (BREZTRI  AEROSPHERE) 160-9-4.8 MCG/ACT AERO inhaler Inhale 2 puffs into the lungs in the morning, at noon, and at bedtime. Patient taking differently: Inhale 2 puffs into the lungs in the morning and at bedtime. 03/08/24   Jillian Buttery, MD  calcium -vitamin D  (OSCAL WITH D) 500-5 MG-MCG tablet Take 2 tablets by mouth 2 (two) times daily. 05/21/24   Krishnan, Gokul, MD  feeding supplement (ENSURE PLUS HIGH PROTEIN) LIQD Take 237 mLs by mouth 3 (three) times daily between meals. 05/21/24   Krishnan, Gokul, MD  gabapentin  (NEURONTIN ) 300 MG capsule Take 1 capsule (300 mg total) by mouth at bedtime. 05/21/24   Krishnan, Gokul, MD  guaiFENesin  (MUCINEX ) 600 MG 12 hr tablet Take 1 tablet (600 mg total) by mouth 2 (two) times daily. Patient taking differently: Take 600 mg by mouth 2 (two) times daily as needed for cough or  to loosen phlegm. 03/08/24 03/08/25  Jillian Buttery, MD  ipratropium-albuterol  (DUONEB) 0.5-2.5 (3) MG/3ML SOLN Take 3 mLs by nebulization in the morning and at bedtime. 02/29/24   [provider]  lactulose  (CHRONULAC ) 10 GM/15ML solution Take 45 mLs (30 g total) by mouth 2 (two) times daily as needed for moderate constipation. 05/21/24   Krishnan, Gokul, MD  levothyroxine  (SYNTHROID ) 125 MCG tablet Take 125 mcg by mouth daily before breakfast.    [provider]  lidocaine  (LIDODERM ) 5 % Place 3 patches onto the skin daily. Remove & Discard patch within 12 hours or as directed by MD Apply to the most painful areas in the back. 05/21/24   Krishnan, Gokul, MD  losartan  (COZAAR ) 25 MG tablet Take 12.5 mg by mouth daily. 02/09/24   [provider]  methocarbamol  (ROBAXIN ) 500 MG tablet  Take 1 tablet (500 mg total) by mouth every 6 (six) hours as needed for muscle spasms. 05/21/24   Krishnan, Gokul, MD  metoprolol  succinate (TOPROL -XL) 25 MG 24 hr tablet Take 12.5 mg by mouth daily.    [provider]  naloxone  (NARCAN ) nasal spray 4 mg/0.1 mL Place 1 spray into the nose as needed. 05/21/24   Krishnan, Gokul, MD  nicotine  (NICODERM CQ  - DOSED IN MG/24 HOURS) 14 mg/24hr patch Place 1 patch (14 mg total) onto the skin daily. 05/21/24   Krishnan, Gokul, MD  nitroGLYCERIN  (NITROSTAT ) 0.4 MG SL tablet Place 0.4 mg under the tongue every 5 (five) minutes as needed for chest pain.    [provider]  oxyCODONE  (OXYCONTIN ) 20 mg 12 hr tablet Take 1 tablet (20 mg total) by mouth every 12 (twelve) hours. 05/21/24   Krishnan, Gokul, MD  oxyCODONE  10 MG TABS Take 2 tablets (20 mg total) by mouth every 4 (four) hours as needed for severe pain (pain score 7-10). 05/21/24   Verdene Purchase, MD  OXYGEN  Inhale 2 L/min into the lungs continuous.    [provider]  polyethylene glycol (MIRALAX  / GLYCOLAX ) 17 g packet Take 17 g by mouth 2 (two) times daily. 05/21/24   Krishnan,  Gokul, MD  polyvinyl alcohol  (LIQUIFILM TEARS) 1.4 % ophthalmic solution Place 1 drop into the left eye in the morning, at noon, in the evening, and at bedtime.    [provider]  prednisoLONE  acetate (PRED FORTE ) 1 % ophthalmic suspension Place 1 drop into the left eye in the morning and at bedtime. 11/19/23   [provider]  rosuvastatin  (CRESTOR ) 20 MG tablet Take 20 mg by mouth daily.    [provider]  senna-docusate (SENOKOT-S) 8.6-50 MG tablet Take 2 tablets by mouth 2 (two) times daily. 05/21/24   Krishnan, Gokul, MD  sertraline  (ZOLOFT ) 25 MG tablet Take 25 mg by mouth in the morning.    [provider]    Physical Exam: Vitals:   06/09/24 2342 06/10/24 0030 06/10/24 0206 06/10/24 0740  BP:  117/63 115/79 120/66  Pulse:  95 94 70  Resp:  15 16 18   Temp:   98.1 F (36.7 C)   TempSrc:   Oral   SpO2: 99% 98% 99% 96%  Weight:      Height:        Constitutional: NAD, calm, comfortable, on 2 L of oxygen  via nasal cannula, appears older than stated age Eyes: PERRL, lids and conjunctivae normal ENMT: Mucous membranes are moist. Posterior pharynx clear of any exudate or lesions.Normal dentition.  Neck: normal, supple, no masses, no thyromegaly Respiratory: Diffuse expiratory wheezing noted Cardiovascular: Regular rate and rhythm, no murmurs / rubs / gallops. No extremity edema. 2+ pedal pulses. No carotid bruits.  Abdomen: no tenderness, no masses palpated. No hepatosplenomegaly. Bowel sounds positive.  Musculoskeletal: no clubbing / cyanosis. No joint deformity upper and lower extremities. Good ROM, no contractures. Normal muscle tone.  Skin: no rashes, lesions, ulcers. No induration Neurologic: CN 2-12 grossly intact. Sensation intact, DTR normal. Strength 5/5 in all 4.  Psychiatric: Normal judgment and insight. Alert and oriented x 3. Normal mood.    Labs on Admission: I have personally reviewed following labs and imaging  studies  CBC: Recent Labs  Lab 06/09/24 2215 06/10/24 0101  WBC 12.2* 9.6  NEUTROABS 10.0*  --   HGB 11.9* 11.6*  HCT 38.9 36.7  MCV 90.0 90.6  PLT 246 229  Basic Metabolic Panel: Recent Labs  Lab 06/09/24 2215 06/10/24 0101  NA 141  --   K 4.1  --   CL 102  --   CO2 27  --   GLUCOSE 115*  --   BUN 11  --   CREATININE 0.52  --   CALCIUM  9.9  --   MG  --  2.4  PHOS  --  2.9   GFR: Estimated Creatinine Clearance: 57.2 mL/min (by C-G formula based on SCr of 0.52 mg/dL). Liver Function Tests: Recent Labs  Lab 06/09/24 2215  AST 22  ALT 14  ALKPHOS 93  BILITOT <0.2  PROT 7.3  ALBUMIN 4.3   Recent Labs  Lab 06/09/24 2215  LIPASE 12   No results for input(s): AMMONIA in the last 168 hours. Coagulation Profile: No results for input(s): INR, PROTIME in the last 168 hours. Cardiac Enzymes: No results for input(s): CKTOTAL, CKMB, CKMBINDEX, TROPONINI in the last 168 hours. BNP (last 3 results) No results for input(s): PROBNP in the last 8760 hours. HbA1C: No results for input(s): HGBA1C in the last 72 hours. CBG: No results for input(s): GLUCAP in the last 168 hours. Lipid Profile: No results for input(s): CHOL, HDL, LDLCALC, TRIG, CHOLHDL, LDLDIRECT in the last 72 hours. Thyroid  Function Tests: No results for input(s): TSH, T4TOTAL, FREET4, T3FREE, THYROIDAB in the last 72 hours. Anemia Panel: No results for input(s): VITAMINB12, FOLATE, FERRITIN, TIBC, IRON , RETICCTPCT in the last 72 hours. Urine analysis:    Component Value Date/Time   COLORURINE YELLOW 06/09/2024 2215   APPEARANCEUR CLEAR 06/09/2024 2215   LABSPEC 1.011 06/09/2024 2215   PHURINE 5.0 06/09/2024 2215   GLUCOSEU NEGATIVE 06/09/2024 2215   HGBUR NEGATIVE 06/09/2024 2215   BILIRUBINUR NEGATIVE 06/09/2024 2215   KETONESUR NEGATIVE 06/09/2024 2215   PROTEINUR NEGATIVE 06/09/2024 2215   UROBILINOGEN 0.2 11/06/2009 1559   NITRITE  NEGATIVE 06/09/2024 2215   LEUKOCYTESUR NEGATIVE 06/09/2024 2215    Radiological Exams on Admission: DG Chest Portable 1 View Result Date: 06/09/2024 CLINICAL DATA:  Short of breath, hypoxia, history of COPD EXAM: PORTABLE CHEST 1 VIEW COMPARISON:  05/16/2024 FINDINGS: Single frontal view of the chest demonstrates a stable cardiac silhouette. Postsurgical changes from partial left pneumonectomy, with stable left apical pleural and parenchymal scarring. No acute airspace disease, effusion, or pneumothorax. No acute bony abnormalities. IMPRESSION: 1. Stable left apical pleural and parenchymal scarring. 2. No acute airspace disease. Electronically Signed   By: Ozell Daring M.D.   On: 06/09/2024 21:56    EKG: Independently reviewed.  Sinus rhythm.  No acute ST-T wave changes noted.  Assessment/Plan  Acute on chronic hypoxemic respiratory failure in the setting of COPD exacerbation: - Presented with shortness of breath, wheezing, chest tightness.  Recently discharged from SNF. - Oxygen  dropped to 70s at home.  Initially required 3 L of oxygen  now on 2 L which is her baseline.  She is afebrile.  Has leukocytosis that have resolved. - Chest x-ray negative for pneumonia.  COVID flu RSV negative.  Started on IV Solu-Medrol . - Admit patient on the floor.  Will continue IV steroids, azithromycin . -Continue scheduled and as needed DuoNebs  Thoraco lumbar compression fracture: - Recently discharged on 05/21/2024 to SNF. - Continue as needed pain medications - Consult PT/OT  History of lung cancer - Status post left upper lung lobectomy  Paroxysmal A-fib: Rate controlled - Continue Eliquis  and metoprolol   Hypothyroidism: Continue levothyroxine   Hyperlipidemia: Continue statin  Hypertension: Blood pressure is stable.  Will continue metoprolol  and losartan  -Monitor blood pressure closely  Anxiety depression/PTSD: -Continue sertraline   Tobacco abuse: Recommend cessation  Will resume home  medications once med review done by pharmacy  DVT prophylaxis: Eliquis  Code Status: DNR Family Communication: None present at bedside.  Plan of care discussed with patient in length and she verbalized understanding and agreed with it. Disposition Plan: To be determined Consults called: None Admission status: Inpatient   Velna JONELLE Skeeter MD [facility]  If 7PM-7AM, please contact night-coverage [email or web address]  06/10/2024, 8:44 AM

## 2024-06-11 DIAGNOSIS — J441 Chronic obstructive pulmonary disease with (acute) exacerbation: Secondary | ICD-10-CM | POA: Diagnosis not present

## 2024-06-11 LAB — CBC
HCT: 34.1 % — ABNORMAL LOW (ref 36.0–46.0)
Hemoglobin: 10.4 g/dL — ABNORMAL LOW (ref 12.0–15.0)
MCH: 27.7 pg (ref 26.0–34.0)
MCHC: 30.5 g/dL (ref 30.0–36.0)
MCV: 90.9 fL (ref 80.0–100.0)
Platelets: 225 K/uL (ref 150–400)
RBC: 3.75 MIL/uL — ABNORMAL LOW (ref 3.87–5.11)
RDW: 13.6 % (ref 11.5–15.5)
WBC: 9.8 K/uL (ref 4.0–10.5)
nRBC: 0 % (ref 0.0–0.2)

## 2024-06-11 LAB — BASIC METABOLIC PANEL WITH GFR
Anion gap: 12 (ref 5–15)
BUN: 15 mg/dL (ref 8–23)
CO2: 25 mmol/L (ref 22–32)
Calcium: 8.5 mg/dL — ABNORMAL LOW (ref 8.9–10.3)
Chloride: 107 mmol/L (ref 98–111)
Creatinine, Ser: 0.44 mg/dL (ref 0.44–1.00)
GFR, Estimated: >60 mL/min (ref >60)
Glucose, Bld: 91 mg/dL (ref 70–99)
Potassium: 3.7 mmol/L (ref 3.5–5.1)
Sodium: 143 mmol/L (ref 135–145)

## 2024-06-11 MED ORDER — ENSURE PLUS HIGH PROTEIN PO LIQD
237.0000 mL | Freq: Three times a day (TID) | ORAL | Status: DC
  Administered 2024-06-11 – 2024-06-12 (×4): 237 mL via ORAL

## 2024-06-11 MED ORDER — KETOROLAC TROMETHAMINE 15 MG/ML IJ SOLN
15.0000 mg | Freq: Once | INTRAMUSCULAR | Status: AC
  Administered 2024-06-11: 15 mg via INTRAVENOUS
  Filled 2024-06-11: qty 1

## 2024-06-11 MED ORDER — ORAL CARE MOUTH RINSE: 15.0000 mL | OROMUCOSAL | Status: DC | PRN

## 2024-06-11 NOTE — Evaluation (Signed)
 Occupational Therapy Evaluation Patient Details Name: Tracy Park MRN: [id number] DOB: 04-Feb-1956 Today's Date: 06/11/2024   History of Present Illness   Tracy Park is a 68 y.o. female who presented with worsening shortness of breath. Of note, admitted 05/12/24-05/21/24 with Multiple compression fractures noted including T8, T11, T12, L1, L2, L5; also hospitalization in 7/25 for an L3 compression fracture hospitalization in 7/25 for an L3 compression fracture. PMH: COPD, chronic hypoxemic respiratory failure on 2 L of oxygen  via nasal cannula, adenocarcinoma of lung status post left upper lobectomy, paroxysmal A-fib on Eliquis , hypothyroidism, anxiety, PTSD     Clinical Impressions Prior to this admission, patient recently discharged from SNF. Patient is typically independent with her rollator, and is on 2L O2. Patient is currently back at her baseline, able to demonstrate a tub transfer without assist, and OT providing education with regard to energy conservation with handout provided. OT signing off at patient has no further acute needs. Please re-consult if further acute needs arise.      If plan is discharge home, recommend the following:   Assist for transportation (initially)     Functional Status Assessment   Patient has had a recent decline in their functional status and/or demonstrates limited ability to make significant improvements in function in a reasonable and predictable amount of time     Equipment Recommendations   None recommended by OT     Recommendations for Other Services         Precautions/Restrictions   Precautions Precautions: Fall;Back Precaution Booklet Issued: No Recall of Precautions/Restrictions: Intact Precaution/Restrictions Comments: at previous admissions multiple compression fractures L3, T8, T11, T12, L1, L2, L5; pt reports has lumbar corset at home, declines PT requesting new one I'm not paying for another one and I don't  always wear it Restrictions Weight Bearing Restrictions Per Provider Order: No     Mobility Bed Mobility Overal bed mobility: Modified Independent             General bed mobility comments: no cues required    Transfers Overall transfer level: Needs assistance Equipment used: None Transfers: Sit to/from Stand Sit to Stand: Supervision           General transfer comment: supervision without AD, able to demonstrate a high step to simulate tub transfer      Balance Overall balance assessment: Needs assistance Sitting-balance support: Feet supported Sitting balance-Leahy Scale: Good     Standing balance support: During functional activity, Bilateral upper extremity supported, Reliant on assistive device for balance Standing balance-Leahy Scale: Poor                             ADL either performed or assessed with clinical judgement   ADL Overall ADL's : At baseline                                       General ADL Comments: Prior to this admission, patient recently discharged from SNF. Patient is typically independent with her rollator, and is on 2L O2. Patient is currently back at her baseline, able to demonstrate a tub transfer without assist, and OT providing education with regard to energy conservation with handout provided. OT signing off at patient has no further acute needs. Please re-consult if further acute needs arise.     Vision Baseline Vision/History: 1 Wears glasses;2 Legally blind (  Blind R eye) Ability to See in Adequate Light: 1 Impaired Patient Visual Report: No change from baseline Vision Assessment?: No apparent visual deficits     Perception Perception: Not tested       Praxis Praxis: Not tested       Pertinent Vitals/Pain Pain Assessment Pain Assessment: Faces Faces Pain Scale: Hurts little more Pain Location: back Pain Descriptors / Indicators: Discomfort, Grimacing, Guarding Pain Intervention(s):  Limited activity within patient's tolerance, Monitored during session, Repositioned     Extremity/Trunk Assessment Upper Extremity Assessment Upper Extremity Assessment: Right hand dominant;Overall [facility] [place] for tasks assessed   Lower Extremity Assessment Lower Extremity Assessment: Defer to PT evaluation   Cervical / Trunk Assessment Cervical / Trunk Assessment: Kyphotic   Communication Communication Communication: No apparent difficulties   Cognition Arousal: Alert Behavior During Therapy: WFL for tasks assessed/performed Cognition: History of cognitive impairments             OT - Cognition Comments: STM deficits, appears to be at her baseline                 Following commands: Intact       Cueing  General Comments      VSS on 2L O2   Exercises     Shoulder Instructions      Home Living Family/patient expects to be discharged to:: Private residence Living Arrangements: Other relatives Available Help at Discharge: Family;Available PRN/intermittently;Friend(s) Type of Home: House Home Access: Stairs to enter [employer] of Steps: 3 Entrance Stairs-Rails: Right;Left;Can reach both Home Layout: One level     Bathroom Shower/Tub: Chief Strategy Officer: Standard Bathroom Accessibility: Yes   Home Equipment: Rollator (4 wheels);Shower seat;Hand held shower head;Adaptive equipment (suction cup grab bars at toilet) Adaptive Equipment: Reacher Additional Comments: On 2 L O2 at home 24/7      Prior Functioning/Environment Prior Level of Function : Independent/Modified Independent             Mobility Comments: pt reports ind with rollator in home and community ADLs Comments: pt reports ind wiht self care and household chores; family provides transportation and assists with shopping    OT Problem List: Decreased activity tolerance   OT Treatment/Interventions: Self-care/ADL training;Therapeutic exercise;DME and/or AE  instruction;Therapeutic activities;Patient/family education;Balance training      OT Goals(Current goals can be found in the care plan section)   Acute Rehab OT Goals Patient Stated Goal: to go home OT Goal Formulation: With patient Time For Goal Achievement: 06/25/24 Potential to Achieve Goals: Good   OT Frequency:  Min 2X/week    Co-evaluation              AM-PAC OT 6 Clicks Daily Activity     Outcome Measure Help from another person eating meals?: None Help from another person taking care of personal grooming?: None Help from another person toileting, which includes using toliet, bedpan, or urinal?: None Help from another person bathing (including washing, rinsing, drying)?: None Help from another person to put on and taking off regular upper body clothing?: None Help from another person to put on and taking off regular lower body clothing?: None 6 Click Score: 24   End of Session Equipment Utilized During Treatment: Oxygen  Nurse Communication: Mobility status  Activity Tolerance: Patient tolerated treatment well Patient left: in bed;with call bell/phone within reach;with bed alarm set  OT Visit Diagnosis: Unsteadiness on feet (R26.81);Muscle weakness (generalized) (M62.81);Pain Pain - part of body:  (back)  Time: 1100-1118 OT Time Calculation (min): 18 min Charges:  OT General Charges $OT Visit: 1 Visit OT Evaluation $OT Eval Moderate Complexity: 1 Mod  Ronal Gift E. Myranda Pavone, OTR/L Acute Rehabilitation Services [phone]   Ronal Gift Salt 06/11/2024, 12:29 PM

## 2024-06-11 NOTE — Plan of Care (Signed)
  Problem: Education: Goal: Knowledge of General Education information will improve Description: Including pain rating scale, medication(s)/side effects and non-pharmacologic comfort measures Outcome: Progressing   Problem: Health Behavior/Discharge Planning: Goal: Ability to manage health-related needs will improve Outcome: Progressing   Problem: Clinical Measurements: Goal: Respiratory complications will improve Outcome: Progressing Goal: Cardiovascular complication will be avoided Outcome: Progressing   Problem: Nutrition: Goal: Adequate nutrition will be maintained Outcome: Progressing   Problem: Coping: Goal: Level of anxiety will decrease Outcome: Progressing   Problem: Elimination: Goal: Will not experience complications related to bowel motility Outcome: Progressing Goal: Will not experience complications related to urinary retention Outcome: Progressing   Problem: Pain Managment: Goal: General experience of comfort will improve and/or be controlled Outcome: Progressing   Problem: Safety: Goal: Ability to remain free from injury will improve Outcome: Progressing   Problem: Skin Integrity: Goal: Risk for impaired skin integrity will decrease Outcome: Progressing

## 2024-06-11 NOTE — Evaluation (Signed)
 Physical Therapy Evaluation Patient Details Name: Tracy Park MRN: [id number] DOB: [birth year] Today's Date: 06/11/2024  History of Present Illness  Tracy Park is a 68 y.o. female who presented with worsening shortness of breath. Of note, admitted 05/12/24-05/21/24 with Multiple compression fractures noted including T8, T11, T12, L1, L2, L5; also hospitalization in 7/25 for an L3 compression fracture hospitalization in 7/25 for an L3 compression fracture. PMH: COPD, chronic hypoxemic respiratory failure on 2 L of oxygen  via nasal cannula, adenocarcinoma of lung status post left upper lobectomy, paroxysmal A-fib on Eliquis , hypothyroidism, anxiety, PTSD  Clinical Impression  Pt admitted with above diagnosis. Pt reports ind with rollator at baseline, reports complete stair training at rehab and did a lot of BLE exercises, wears 2L O2 at baseline, single level home with 3 steps to enter. Pt reports goal is to move to [place]  where all her family is when she leaves the hospital. On eval, pt comes to sitting at bedside via log roll, no assistance. Pt completes transfers and ambulation of 200 ft with rollator, supv for safety, initial cues for hand placement and brake management with good carryover, therapist managing O2 tank. Pt on 2L O2 with SPO2 96% with ambulation and pt conversational. Pt denies increased pain with ambulation. Pt pleasant throughout, follows commands, denies numbness/tingling throughout BLE and reports chronic back pain. Pt reports awareness of multiple compression fractures in spine, has lumbar corset brace at home, doesn't always wear it because it is uncomfortable; pt politely declines PT order new brace I'm not paying for another one and I don't always wear it- notified MD. Recommend HHPT at d/c; pt has rollator at home. Pt currently with functional limitations due to the deficits listed below (see PT Problem List). Pt will benefit from acute skilled PT to increase their  independence and safety with mobility to allow discharge.           If plan is discharge home, recommend the following: Assistance with cooking/housework;Assist for transportation;Help with stairs or ramp for entrance;A little help with walking and/or transfers;A little help with bathing/dressing/bathroom   Can travel by private vehicle   Yes    Equipment Recommendations None recommended by PT  Recommendations for Other Services       Functional Status Assessment Patient has had a recent decline in their functional status and demonstrates the ability to make significant improvements in function in a reasonable and predictable amount of time.     Precautions / Restrictions Precautions Precautions: Fall;Back Recall of Precautions/Restrictions: Intact Precaution/Restrictions Comments: at previous admissions multiple compression fractures L3, T8, T11, T12, L1, L2, L5; pt reports has lumbar corset at home, declines PT requesting new one I'm not paying for another one and I don't always wear it Restrictions Weight Bearing Restrictions Per Provider Order: No      Mobility  Bed Mobility Overal bed mobility: Modified Independent             General bed mobility comments: mod ind with log rolling up to sitting and down to supine, no assist or cues    Transfers Overall transfer level: Needs assistance Equipment used: Rollator (4 wheels) Transfers: Sit to/from Stand Sit to Stand: Supervision           General transfer comment: cues for rollator brake management, supv for STS with pt pushing from seated surface to power up    Ambulation/Gait Ambulation/Gait assistance: Supervision Gait Distance (Feet): 200 Feet Assistive device: Rollator (4 wheels) Gait Pattern/deviations: Step-through pattern, Decreased  stride length, Trunk flexed Gait velocity: decreased but functional     General Gait Details: step through gait pattern, decreased cadence but functional, trunk  forward flexed (compression fractures), denies increased pain, no LE Buckling noted, able to complete turns and navigate around obstacles without LOB  Stairs            Wheelchair Mobility     Tilt Bed    Modified Rankin (Stroke Patients Only)       Balance Overall balance assessment: Needs assistance Sitting-balance support: Feet supported Sitting balance-Leahy Scale: Good     Standing balance support: During functional activity, Bilateral upper extremity supported, Reliant on assistive device for balance Standing balance-Leahy Scale: Poor                               Pertinent Vitals/Pain Pain Assessment Pain Assessment: Faces Faces Pain Scale: Hurts little more Pain Location: back Pain Descriptors / Indicators: Constant (it's the same as always) Pain Intervention(s): Limited activity within patient's tolerance, Monitored during session, Repositioned    Home Living Family/patient expects to be discharged to:: Private residence Living Arrangements: Other relatives Available Help at Discharge: Family;Available PRN/intermittently;Friend(s) Type of Home: House Home Access: Stairs to enter Entrance Stairs-Rails: Right;Left;Can reach both Entrance Stairs-Number of Steps: 3   Home Layout: One level Home Equipment: Rollator (4 wheels);Shower seat;Hand held shower head;Adaptive equipment (suction cup grab bars at toilet) Additional Comments: On 2 L O2 at home 24/7    Prior Function Prior Level of Function : Independent/Modified Independent             Mobility Comments: pt reports ind with rollator in home and community ADLs Comments: pt reports ind wiht self care and household chores; family provides transportation and assists with shopping     Extremity/Trunk Assessment   Upper Extremity Assessment Upper Extremity Assessment: Defer to OT evaluation    Lower Extremity Assessment Lower Extremity Assessment: Overall WFL for tasks assessed  (AROM WFL, strength grossly 3+/5, denies numbness/tingling throughout LEs and peri region)    Cervical / Trunk Assessment Cervical / Trunk Assessment: Kyphotic  Communication   Communication Communication: No apparent difficulties    Cognition Arousal: Alert Behavior During Therapy: WFL for tasks assessed/performed   PT - Cognitive impairments: No apparent impairments                       PT - Cognition Comments: pt alert, pleasant, motivated to d/c home back to [facility] with family Following commands: Intact       Cueing       General Comments General comments (skin integrity, edema, etc.): pt on 2L with SPO2 96% with ambulation    Exercises     Assessment/Plan    PT Assessment Patient needs continued PT services  PT Problem List Decreased strength;Decreased range of motion;Decreased activity tolerance;Pain;Decreased balance       PT Treatment Interventions DME instruction;Gait training;Stair training;Functional mobility training;Therapeutic activities;Therapeutic exercise;Balance training;Patient/family education    PT Goals (Current goals can be found in the Care Plan section)  Acute Rehab PT Goals Patient Stated Goal: move to [place]  PT Goal Formulation: With patient Time For Goal Achievement: 06/25/24 Potential to Achieve Goals: Good    Frequency Min 3X/week     Co-evaluation               AM-PAC PT 6 Clicks Mobility  Outcome Measure Help needed turning from your  back to your side while in a flat bed without using bedrails?: None Help needed moving from lying on your back to sitting on the side of a flat bed without using bedrails?: None Help needed moving to and from a bed to a chair (including a wheelchair)?: A Little Help needed standing up from a chair using your arms (e.g., wheelchair or bedside chair)?: A Little Help needed to walk in hospital room?: A Little Help needed climbing 3-5 steps with a railing? : A Little 6 Click Score:  20    End of Session Equipment Utilized During Treatment: Gait belt;Oxygen  Activity Tolerance: Patient tolerated treatment well Patient left: in bed;with call bell/phone within reach;with bed alarm set Nurse Communication: Mobility status PT Visit Diagnosis: Pain;Other abnormalities of gait and mobility (R26.89) Pain - part of body:  (back)    Time: 1014-1040 PT Time Calculation (min) (ACUTE ONLY): 26 min   Charges:   PT Evaluation $PT Eval Low Complexity: 1 Low PT Treatments $Gait Training: 8-22 mins PT General Charges $$ ACUTE PT VISIT: 1 Visit         Tori Charlies Rayburn PT, DPT 06/11/24, 11:06 AM

## 2024-06-11 NOTE — Progress Notes (Signed)
 PROGRESS NOTE    Tracy Park  FMW:[id number] DOB: 08-27-1956 DOA: 06/09/2024 PCP: Corlis Longs, FNP   Brief Narrative:  This 68 y.o. female with medical history significant of COPD, chronic hypoxemic respiratory failure on 2 L of oxygen  via nasal cannula, adenocarcinoma of lung status post left upper lobectomy, paroxysmal A-fib on Eliquis , hypothyroidism, anxiety, PTSD presented with worsening shortness of breath.  Patient was recently admitted for T8 vertebrae compression fracture and discharged to [facility] on 05/21/2024.  She was discharged from SNF yesterday, as soon as she arrived home and she has smoked 2-1/2 cigarettes and subsequently started having respiratory difficulty, chest tightness and wheezing.  EMS was called and she was found to have SpO2 of 70% initially which improved to 80% on 3 L of oxygen .  Workup so far negative in the ED, leukocytosis 12.2 chest x-ray shows no pneumonia but patient continued to have significant wheezing. Patient was admitted for acute COPD exacerbation.  Assessment & Plan:   Active Problems:   COPD exacerbation (HCC)   Acute on chronic hypoxemic respiratory failure: COPD with acute exacerbation: Patient presented with shortness of breath, wheezing, chest tightness.   She was recently discharged from SNF.  As soon as she arrived home she has smoked 2 1/2 cigarettes. Oxygen  dropped to 70s at home.  Initially required 3 L of oxygen  now on 2 L which is her baseline.   She is afebrile.  Has leukocytosis that have resolved. Chest x-ray negative for pneumonia.  COVID,  flu , RSV negative.  Started on IV Solu-Medrol . Will continue IV steroids, azithromycin . Continue scheduled and as needed DuoNebs. She still has significant wheezing but improving.   Thoraco lumbar compression fracture: She was recently discharged on 05/21/2024 to SNF. Continue as needed pain medications. Consult PT/OT   History of lung cancer: Status post left upper lung  lobectomy.   Paroxysmal A-fib: Rate controlled. Continue Eliquis  and metoprolol .   Hypothyroidism:  Continue levothyroxine .   Hyperlipidemia:  Continue statin.   Hypertension:  Blood pressure is stable. Continue metoprolol  and losartan . -Monitor blood pressure closely.   Anxiety/ depression / PTSD: -Continue sertraline .   Tobacco abuse: Recommend cessation.   DVT prophylaxis: Eliquis  Code Status: DNR Family Communication: No Family at bedside Disposition Plan:    Status is: Inpatient Remains inpatient appropriate because: Admitted for COPD exacerbation.   Consultants:  None  Procedures: None  Antimicrobials:  Anti-infectives (From admission, onward)    Start     Dose/Rate Route Frequency Ordered Stop   06/10/24 0015  azithromycin  (ZITHROMAX ) 500 mg in sodium chloride  0.9 % 250 mL IVPB        500 mg 250 mL/hr over 60 Minutes Intravenous Daily at bedtime 06/10/24 0004        Subjective: Patient was seen and examined at bedside.  Overnight events noted. Patient reports feeling better,  She still has significant wheezing but improving.  Objective: Vitals:   06/10/24 2300 06/11/24 0218 06/11/24 0657 06/11/24 0846  BP:  111/75 119/77   Pulse:  86 80   Resp:  (!) 22 (!) 22   Temp:  98.6 F (37 C) 98.1 F (36.7 C)   TempSrc:      SpO2:  100% 100% 98%  Weight: 51.6 kg     Height:        Intake/Output Summary (Last 24 hours) at 06/11/2024 1139 Last data filed at 06/11/2024 1014 Gross per 24 hour  Intake 360 ml  Output --  Net 360  ml   Filed Weights   06/09/24 2126 06/10/24 2300  Weight: 53.5 kg 51.6 kg    Examination:  General exam: Appears calm and comfortable, not in any acute distress. Respiratory system: Wheezing Bilaterally. Respiratory effort normal. RR 20 Cardiovascular system: S1 & S2 heard, RRR. No JVD, murmurs, rubs, gallops or clicks.  Gastrointestinal system: Abdomen is non distended, soft and non tender.  Normal bowel sounds  heard. Central nervous system: Alert and oriented X 3. No focal neurological deficits. Extremities: No edema, no cyanosis, no clubbing. Skin: No rashes, lesions or ulcers Psychiatry: Judgement and insight appear normal. Mood & affect appropriate.     Data Reviewed: I have personally reviewed following labs and imaging studies  CBC: Recent Labs  Lab 06/09/24 2215 06/10/24 0101 06/11/24 0503  WBC 12.2* 9.6 9.8  NEUTROABS 10.0*  --   --   HGB 11.9* 11.6* 10.4*  HCT 38.9 36.7 34.1*  MCV 90.0 90.6 90.9  PLT 246 229 225   Basic Metabolic Panel: Recent Labs  Lab 06/09/24 2215 06/10/24 0101 06/11/24 0503  NA 141  --  143  K 4.1  --  3.7  CL 102  --  107  CO2 27  --  25  GLUCOSE 115*  --  91  BUN 11  --  15  CREATININE 0.52  --  0.44  CALCIUM  9.9  --  8.5*  MG  --  2.4  --   PHOS  --  2.9  --    GFR: Estimated Creatinine Clearance: 56.3 mL/min (by C-G formula based on SCr of 0.44 mg/dL). Liver Function Tests: Recent Labs  Lab 06/09/24 2215  AST 22  ALT 14  ALKPHOS 93  BILITOT <0.2  PROT 7.3  ALBUMIN 4.3   Recent Labs  Lab 06/09/24 2215  LIPASE 12   No results for input(s): AMMONIA in the last 168 hours. Coagulation Profile: No results for input(s): INR, PROTIME in the last 168 hours. Cardiac Enzymes: No results for input(s): CKTOTAL, CKMB, CKMBINDEX, TROPONINI in the last 168 hours. BNP (last 3 results) No results for input(s): PROBNP in the last 8760 hours. HbA1C: No results for input(s): HGBA1C in the last 72 hours. CBG: No results for input(s): GLUCAP in the last 168 hours. Lipid Profile: No results for input(s): CHOL, HDL, LDLCALC, TRIG, CHOLHDL, LDLDIRECT in the last 72 hours. Thyroid  Function Tests: Recent Labs    06/10/24 1445  TSH 0.523   Anemia Panel: No results for input(s): VITAMINB12, FOLATE, FERRITIN, TIBC, IRON , RETICCTPCT in the last 72 hours. Sepsis Labs: Recent Labs  Lab 06/09/24 2228  06/10/24 0057  LATICACIDVEN 1.4 0.5    Recent Results (from the past 240 hours)  Resp panel by RT-PCR (RSV, Flu A&B, Covid) Anterior Nasal Swab     Status: None   Collection Time: 06/09/24 10:15 PM   Specimen: Anterior Nasal Swab  Result Value Ref Range Status   SARS Coronavirus 2 by RT PCR NEGATIVE NEGATIVE Final    Comment: (NOTE) SARS-CoV-2 target nucleic acids are NOT DETECTED.  The SARS-CoV-2 RNA is generally detectable in upper respiratory specimens during the acute phase of infection. The lowest concentration of SARS-CoV-2 viral copies this assay can detect is 138 copies/mL. A negative result does not preclude SARS-Cov-2 infection and should not be used as the sole basis for treatment or other patient management decisions. A negative result may occur with  improper specimen collection/handling, submission of specimen other than nasopharyngeal swab, presence of viral mutation(s) within  the areas targeted by this assay, and inadequate number of viral copies(<138 copies/mL). A negative result must be combined with clinical observations, patient history, and epidemiological information. The expected result is Negative.  Fact Sheet for Patients:  BloggerCourse.com  Fact Sheet for Healthcare Providers:  SeriousBroker.it  This test is no t yet approved or cleared by the [place]  FDA and  has been authorized for detection and/or diagnosis of SARS-CoV-2 by FDA under an Emergency Use Authorization (EUA). This EUA will remain  in effect (meaning this test can be used) for the duration of the COVID-19 declaration under Section 564(b)(1) of the Act, 21 U.S.C.section 360bbb-3(b)(1), unless the authorization is terminated  or revoked sooner.       Influenza A by PCR NEGATIVE NEGATIVE Final   Influenza B by PCR NEGATIVE NEGATIVE Final    Comment: (NOTE) The Xpert Xpress SARS-CoV-2/FLU/RSV plus assay is intended as an aid in  the diagnosis of influenza from Nasopharyngeal swab specimens and should not be used as a sole basis for treatment. Nasal washings and aspirates are unacceptable for Xpert Xpress SARS-CoV-2/FLU/RSV testing.  Fact Sheet for Patients: BloggerCourse.com  Fact Sheet for Healthcare Providers: SeriousBroker.it  This test is not yet approved or cleared by the [place]  FDA and has been authorized for detection and/or diagnosis of SARS-CoV-2 by FDA under an Emergency Use Authorization (EUA). This EUA will remain in effect (meaning this test can be used) for the duration of the COVID-19 declaration under Section 564(b)(1) of the Act, 21 U.S.C. section 360bbb-3(b)(1), unless the authorization is terminated or revoked.     Resp Syncytial Virus by PCR NEGATIVE NEGATIVE Final    Comment: (NOTE) Fact Sheet for Patients: BloggerCourse.com  Fact Sheet for Healthcare Providers: SeriousBroker.it  This test is not yet approved or cleared by the [place]  FDA and has been authorized for detection and/or diagnosis of SARS-CoV-2 by FDA under an Emergency Use Authorization (EUA). This EUA will remain in effect (meaning this test can be used) for the duration of the COVID-19 declaration under Section 564(b)(1) of the Act, 21 U.S.C. section 360bbb-3(b)(1), unless the authorization is terminated or revoked.  Performed at [facility] [address], [place], [place] [address]     Radiology Studies: DG Chest Portable 1 View Result Date: 06/09/2024 CLINICAL DATA:  Short of breath, hypoxia, history of COPD EXAM: PORTABLE CHEST 1 VIEW COMPARISON:  05/16/2024 FINDINGS: Single frontal view of the chest demonstrates a stable cardiac silhouette. Postsurgical changes from partial left pneumonectomy, with stable left apical pleural and parenchymal scarring. No acute airspace disease,  effusion, or pneumothorax. No acute bony abnormalities. IMPRESSION: 1. Stable left apical pleural and parenchymal scarring. 2. No acute airspace disease. Electronically Signed   By: Ozell Daring M.D.   On: 06/09/2024 21:56   Scheduled Meds:  apixaban   5 mg Oral BID   feeding supplement  237 mL Oral TID BM   Influenza vac split trivalent PF  0.5 mL Intramuscular Tomorrow-1000   ipratropium-albuterol   3 mL Nebulization TID   methylPREDNISolone  (SOLU-MEDROL ) injection  40 mg Intravenous Daily   Continuous Infusions:  azithromycin  Stopped (06/10/24 2231)     LOS: 1 day    Time spent: 50 Mins    Darcel Dawley, MD [facility]   If 7PM-7AM, please contact night-coverage

## 2024-06-11 NOTE — Progress Notes (Signed)
 Initial Nutrition Assessment  DOCUMENTATION CODES:   Non-severe (moderate) malnutrition in context of chronic illness  INTERVENTION:   -Ensure Plus High Protein po TID, each supplement provides 350 kcal and 20 grams of protein.   -Liberalized diet to regular given malnutrition  NUTRITION DIAGNOSIS:   Moderate Malnutrition related to chronic illness (COPD) as evidenced by moderate fat depletion, severe muscle depletion.  GOAL:   Patient will meet greater than or equal to 90% of their needs  MONITOR:   PO intake, Supplement acceptance  REASON FOR ASSESSMENT:   Consult Assessment of nutrition requirement/status  ASSESSMENT:   68 y.o. female with medical history significant of COPD, chronic hypoxemic respiratory failure on 2 L of oxygen  via nasal cannula, adenocarcinoma of lung status post left upper lobectomy, paroxysmal A-fib on Eliquis , hypothyroidism, anxiety, PTSD presented with worsening shortness of breath.   Patient was recently admitted for T8 vertebrae compression fracture and discharged to [facility] on 05/21/2024.  She was discharged from SNF yesterday, as soon as she arrived home and she has smoked 2-1/2 cigarettes and subsequently started having respiratory difficulty, chest tightness and wheezing.  Patient familiar to RD from previous admissions. Pt was recently at SNF, came home and resumed smoking. Has chronically low appetite but will consume protein shakes when provided at hospital. Pt currently consuming 75-100% of meals. Ensure ordered.  Per weight records, pt's weight has increased slightly. UBW ~105 lbs. Current weight: 113 lbs  Medications reviewed.  Labs reviewed.  NUTRITION - FOCUSED PHYSICAL EXAM:  Flowsheet Row Most Recent Value  Orbital Region Severe depletion  Upper Arm Region Moderate depletion  Thoracic and Lumbar Region Moderate depletion  Buccal Region Moderate depletion  Temple Region Moderate depletion  Clavicle Bone Region Severe  depletion  Clavicle and Acromion Bone Region Severe depletion  Scapular Bone Region Severe depletion  Dorsal Hand Severe depletion  [h/o left hand injury]  Patellar Region Severe depletion  Anterior Thigh Region Severe depletion  Posterior Calf Region Severe depletion  Edema (RD Assessment) None  Hair Reviewed  Eyes Reviewed  [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  Estimated Nutritional Needs:   Kcal:  1600-1800  Protein:  75-90g  Fluid:  1.8L/day   Morna Lee, MS, RD, LDN Inpatient Clinical Dietitian Contact via Secure chat

## 2024-06-12 DIAGNOSIS — J441 Chronic obstructive pulmonary disease with (acute) exacerbation: Secondary | ICD-10-CM | POA: Diagnosis not present

## 2024-06-12 MED ORDER — PREDNISONE 20 MG PO TABS: 40.0000 mg | ORAL_TABLET | Freq: Every day | ORAL | 0 refills | Status: AC

## 2024-06-12 NOTE — Plan of Care (Signed)
   Problem: Education: Goal: Knowledge of General Education information will improve Description: Including pain rating scale, medication(s)/side effects and non-pharmacologic comfort measures Outcome: Progressing   Problem: Clinical Measurements: Goal: Ability to maintain clinical measurements within normal limits will improve Outcome: Progressing   Problem: Clinical Measurements: Goal: Respiratory complications will improve Outcome: Progressing

## 2024-06-12 NOTE — Discharge Instructions (Signed)
 Advised to follow up PCP in one week. Advised to take prednisone  40 mg daily for 3 more days. Advised to quit smoking.

## 2024-06-12 NOTE — Plan of Care (Signed)

## 2024-06-12 NOTE — Plan of Care (Signed)
 Problem: Education: Goal: Knowledge of General Education information will improve Description: Including pain rating scale, medication(s)/side effects and non-pharmacologic comfort measures 06/12/2024 1655 by Claudene Rolin LABOR, RN Outcome: Adequate for Discharge 06/12/2024 1520 by Claudene Rolin LABOR, RN Outcome: Progressing   Problem: Health Behavior/Discharge Planning: Goal: Ability to manage health-related needs will improve 06/12/2024 1655 by Claudene Rolin LABOR, RN Outcome: Adequate for Discharge 06/12/2024 1520 by Claudene Rolin LABOR, RN Outcome: Progressing   Problem: Clinical Measurements: Goal: Ability to maintain clinical measurements within normal limits will improve 06/12/2024 1655 by Claudene Rolin LABOR, RN Outcome: Adequate for Discharge 06/12/2024 1520 by Claudene Rolin LABOR, RN Outcome: Progressing Goal: Will remain free from infection 06/12/2024 1655 by Claudene Rolin LABOR, RN Outcome: Adequate for Discharge 06/12/2024 1520 by Claudene Rolin LABOR, RN Outcome: Progressing Goal: Diagnostic test results will improve 06/12/2024 1655 by Claudene Rolin LABOR, RN Outcome: Adequate for Discharge 06/12/2024 1520 by Claudene Rolin LABOR, RN Outcome: Progressing Goal: Respiratory complications will improve 06/12/2024 1655 by Claudene Rolin LABOR, RN Outcome: Adequate for Discharge 06/12/2024 1520 by Claudene Rolin LABOR, RN Outcome: Progressing Goal: Cardiovascular complication will be avoided 06/12/2024 1655 by Claudene Rolin LABOR, RN Outcome: Adequate for Discharge 06/12/2024 1520 by Claudene Rolin LABOR, RN Outcome: Progressing   Problem: Activity: Goal: Risk for activity intolerance will decrease 06/12/2024 1655 by Claudene Rolin LABOR, RN Outcome: Adequate for Discharge 06/12/2024 1520 by Claudene Rolin LABOR, RN Outcome: Progressing   Problem: Nutrition: Goal: Adequate nutrition will be maintained 06/12/2024 1655 by Claudene Rolin LABOR, RN Outcome: Adequate for Discharge 06/12/2024 1520 by Claudene Rolin LABOR, RN Outcome:  Progressing   Problem: Coping: Goal: Level of anxiety will decrease 06/12/2024 1655 by Claudene Rolin LABOR, RN Outcome: Adequate for Discharge 06/12/2024 1520 by Claudene Rolin LABOR, RN Outcome: Progressing   Problem: Elimination: Goal: Will not experience complications related to bowel motility 06/12/2024 1655 by Claudene Rolin LABOR, RN Outcome: Adequate for Discharge 06/12/2024 1520 by Claudene Rolin LABOR, RN Outcome: Progressing Goal: Will not experience complications related to urinary retention 06/12/2024 1655 by Claudene Rolin LABOR, RN Outcome: Adequate for Discharge 06/12/2024 1520 by Claudene Rolin LABOR, RN Outcome: Progressing   Problem: Pain Managment: Goal: General experience of comfort will improve and/or be controlled 06/12/2024 1655 by Claudene Rolin LABOR, RN Outcome: Adequate for Discharge 06/12/2024 1520 by Claudene Rolin LABOR, RN Outcome: Progressing   Problem: Safety: Goal: Ability to remain free from injury will improve 06/12/2024 1655 by Claudene Rolin LABOR, RN Outcome: Adequate for Discharge 06/12/2024 1520 by Claudene Rolin LABOR, RN Outcome: Progressing   Problem: Skin Integrity: Goal: Risk for impaired skin integrity will decrease 06/12/2024 1655 by Claudene Rolin LABOR, RN Outcome: Adequate for Discharge 06/12/2024 1520 by Claudene Rolin LABOR, RN Outcome: Progressing   Problem: Education: Goal: Knowledge of disease or condition will improve 06/12/2024 1655 by Claudene Rolin LABOR, RN Outcome: Adequate for Discharge 06/12/2024 1520 by Claudene Rolin LABOR, RN Outcome: Progressing Goal: Knowledge of the prescribed therapeutic regimen will improve 06/12/2024 1655 by Claudene Rolin LABOR, RN Outcome: Adequate for Discharge 06/12/2024 1520 by Claudene Rolin LABOR, RN Outcome: Progressing Goal: Individualized Educational Video(s) 06/12/2024 1655 by Claudene Rolin LABOR, RN Outcome: Adequate for Discharge 06/12/2024 1520 by Claudene Rolin LABOR, RN Outcome: Progressing   Problem: Activity: Goal: Ability to tolerate  increased activity will improve 06/12/2024 1655 by Claudene Rolin LABOR, RN Outcome: Adequate for Discharge 06/12/2024 1520 by Claudene Rolin LABOR, RN Outcome: Progressing Goal: Will verbalize the importance of balancing activity with adequate rest periods 06/12/2024  1655 by Claudene Rolin LABOR, RN Outcome: Adequate for Discharge 06/12/2024 1520 by Claudene Rolin LABOR, RN Outcome: Progressing   Problem: Respiratory: Goal: Ability to maintain a clear airway will improve 06/12/2024 1655 by Claudene Rolin LABOR, RN Outcome: Adequate for Discharge 06/12/2024 1520 by Claudene Rolin LABOR, RN Outcome: Progressing Goal: Levels of oxygenation will improve 06/12/2024 1655 by Claudene Rolin LABOR, RN Outcome: Adequate for Discharge 06/12/2024 1520 by Claudene Rolin LABOR, RN Outcome: Progressing Goal: Ability to maintain adequate ventilation will improve 06/12/2024 1655 by Claudene Rolin LABOR, RN Outcome: Adequate for Discharge 06/12/2024 1520 by Claudene Rolin LABOR, RN Outcome: Progressing

## 2024-06-12 NOTE — Discharge Summary (Signed)
 Physician Discharge Summary  Tracy Park FMW:[id number] DOB: 16-Nov-1955 DOA: 06/09/2024  PCP: Corlis Longs, FNP  Admit date: 06/09/2024  Discharge date: 06/12/2024  Admitted From: Home Disposition:  Home  Recommendations for Outpatient Follow-up:  Follow up with PCP in 1-2 weeks. Please obtain BMP/CBC in one week. Advised to take prednisone  40 mg daily for 3 more days. Advised to quit smoking.  Home Health: None Equipment/Devices: Home oxygen @ 2l/min  Discharge Condition: Stable. CODE STATUS: DNR Diet recommendation: Heart Healthy   Brief Summary / Hospital Course: This 68 y.o. female with medical history significant of COPD, chronic hypoxemic respiratory failure on 2 L of oxygen  via nasal cannula, adenocarcinoma of lung status post left upper lobectomy, paroxysmal A-fib on Eliquis , hypothyroidism, anxiety, PTSD presented with worsening shortness of breath. Patient was recently admitted for T8 vertebrae compression fracture and discharged to [facility] on 05/21/2024.  She was discharged from SNF yesterday, as soon as she arrived home and she has smoked 2-1/2 cigarettes and subsequently started having respiratory difficulty, chest tightness and wheezing.  EMS was called and she was found to have SpO2 of 70% initially which improved to 80% on 3 L of oxygen .  Workup so far negative in the ED, leukocytosis 12.2 chest x-ray shows no pneumonia but patient continued to have significant wheezing. Patient was admitted for acute COPD exacerbation. Patient has made significant improvement with IV solumedrol and scheduled and as needed nebulized bronchodilators, she feels better and wants to be discharged home.   Discharge Diagnoses:  Active Problems:   COPD exacerbation [facility])    Discharge Instructions  Discharge Instructions     Call MD for:  difficulty breathing, headache or visual disturbances   Complete by: As directed    Call MD for:  persistant nausea and vomiting   Complete  by: As directed    Diet - low sodium heart healthy   Complete by: As directed    Diet general   Complete by: As directed    Discharge instructions   Complete by: As directed    Advised to follow up PCP in one week. Advised to take prednisone  40 mg daily for 3 more days. Advised to quit smoking.   Increase activity slowly   Complete by: As directed       Allergies as of 06/12/2024       Reactions   Strawberry Extract Hives, Itching   Tomato Hives, Itching, Other (See Comments)   Can tolerate tomato sauce in pasta, per family   Aspirin Hives   Red Dye #40 (allura Red) Hives, Itching, Other (See Comments)   Red food dye- can tolerate liquid morphine  sulfate that contains this, however (dist. by [employer])   Tape Rash, Other (See Comments)   Prefers paper tape   Wound Dressing Adhesive Rash        Medication List     TAKE these medications    acetaminophen  325 MG tablet Commonly known as: TYLENOL  Take 2 tablets (650 mg total) by mouth every 6 (six) hours as needed for mild pain (pain score 1-3) or fever (or Fever >/= 101).   alendronate  70 MG tablet Commonly known as: Fosamax  Take 1 tablet (70 mg total) by mouth every 7 (seven) days. Take with a full glass of water  on an empty stomach. What changed: when to take this   artificial tears ophthalmic solution Place 1 drop into both eyes 4 (four) times daily.   Breztri  Aerosphere 160-9-4.8 MCG/ACT Aero inhaler Generic drug:  budesonide -glycopyrrolate -formoterol  Inhale 2 puffs into the lungs in the morning, at noon, and at bedtime. What changed: when to take this   calcium -vitamin D  500-5 MG-MCG tablet Commonly known as: OSCAL WITH D Take 2 tablets by mouth 2 (two) times daily.   Eliquis  5 MG Tabs tablet Generic drug: apixaban  Take 1 tablet (5 mg total) by mouth 2 (two) times daily.   lactose free nutrition Liqd Take 237 mLs by mouth 3 (three) times daily with meals.   feeding supplement Liqd Take 237  mLs by mouth 3 (three) times daily between meals.   gabapentin  300 MG capsule Commonly known as: NEURONTIN  Take 1 capsule (300 mg total) by mouth at bedtime.   guaiFENesin  600 MG 12 hr tablet Commonly known as: Mucinex  Take 1 tablet (600 mg total) by mouth 2 (two) times daily. What changed: when to take this   ipratropium-albuterol  0.5-2.5 (3) MG/3ML Soln Commonly known as: DUONEB Take 3 mLs by nebulization in the morning and at bedtime.   lactulose  10 GM/15ML solution Commonly known as: CHRONULAC  Take 45 mLs (30 g total) by mouth 2 (two) times daily as needed for moderate constipation. What changed: reasons to take this   levothyroxine  125 MCG tablet Commonly known as: SYNTHROID  Take 125 mcg by mouth daily before breakfast.   lidocaine  4 % Place 3 patches onto the skin See admin instructions. Apply 3 patches to painful areas of the back - remove as directed What changed: Another medication with the same name was removed. Continue taking this medication, and follow the directions you see here.   losartan  25 MG tablet Commonly known as: COZAAR  Take 12.5 mg by mouth daily.   methocarbamol  500 MG tablet Commonly known as: ROBAXIN  Take 1 tablet (500 mg total) by mouth every 6 (six) hours as needed for muscle spasms.   metoprolol  succinate 25 MG 24 hr tablet Commonly known as: TOPROL -XL Take 12.5 mg by mouth daily.   naloxone  4 MG/0.1ML Liqd nasal spray kit Commonly known as: NARCAN  Place 1 spray into the nose as needed. What changed: reasons to take this   nicotine  14 mg/24hr patch Commonly known as: NICODERM CQ  - dosed in mg/24 hours Place 1 patch (14 mg total) onto the skin daily.   nitroGLYCERIN  0.4 MG SL tablet Commonly known as: NITROSTAT  Place 0.4 mg under the tongue every 5 (five) minutes x 3 doses as needed for chest pain (call 9-1-1 after the third dose, if no relief).   Oxycodone  HCl 10 MG Tabs Take 2 tablets (20 mg total) by mouth every 4 (four) hours as  needed for severe pain (pain score 7-10). What changed: reasons to take this   oxyCODONE  20 mg 12 hr tablet Commonly known as: OXYCONTIN  Take 1 tablet (20 mg total) by mouth every 12 (twelve) hours. What changed: Another medication with the same name was changed. Make sure you understand how and when to take each.   OXYGEN  Inhale 2-2.5 L/min into the lungs continuous.   polyethylene glycol 17 g packet Commonly known as: MIRALAX  / GLYCOLAX  Take 17 g by mouth 2 (two) times daily.   prednisoLONE  acetate 1 % ophthalmic suspension Commonly known as: PRED FORTE  Place 1 drop into the left eye in the morning and at bedtime.   predniSONE  20 MG tablet Commonly known as: DELTASONE  Take 2 tablets (40 mg total) by mouth daily with breakfast for 3 days.   rosuvastatin  20 MG tablet Commonly known as: CRESTOR  Take 20 mg by mouth at bedtime.  senna-docusate 8.6-50 MG tablet Commonly known as: Senokot-S Take 2 tablets by mouth 2 (two) times daily.   sertraline  25 MG tablet Commonly known as: ZOLOFT  Take 25 mg by mouth in the morning.   Ventolin  HFA 108 (90 Base) MCG/ACT inhaler Generic drug: albuterol  Inhale 2 puffs into the lungs every 6 (six) hours as needed for wheezing or shortness of breath.        Follow-up Information     Corlis Longs, FNP Follow up in 1 week(s).   Specialty: Family Medicine Contact information: [address] [place] [address] [phone]                Allergies  Allergen Reactions   Strawberry Extract Hives and Itching   Tomato Hives, Itching and Other (See Comments)    Can tolerate tomato sauce in pasta, per family   Aspirin Hives   Red Dye #40 (Allura Red) Hives, Itching and Other (See Comments)    Red food dye- can tolerate liquid morphine  sulfate that contains this, however (dist. by [employer])   Tape Rash and Other (See Comments)    Prefers paper tape   Wound Dressing Adhesive Rash     Consultations: None   Procedures/Studies: DG Chest Portable 1 View Result Date: 06/09/2024 CLINICAL DATA:  Short of breath, hypoxia, history of COPD EXAM: PORTABLE CHEST 1 VIEW COMPARISON:  05/16/2024 FINDINGS: Single frontal view of the chest demonstrates a stable cardiac silhouette. Postsurgical changes from partial left pneumonectomy, with stable left apical pleural and parenchymal scarring. No acute airspace disease, effusion, or pneumothorax. No acute bony abnormalities. IMPRESSION: 1. Stable left apical pleural and parenchymal scarring. 2. No acute airspace disease. Electronically Signed   By: Ozell Daring M.D.   On: 06/09/2024 21:56   MR LUMBAR SPINE WO CONTRAST Result Date: 05/17/2024 EXAM: MRI LUMBAR SPINE 05/17/2024 12:47:46 PM TECHNIQUE: Multiplanar multisequence MRI of the lumbar spine was performed without the administration of intravenous contrast. COMPARISON: CT lumbar spine 04/30/24 and 05/12/24. Comparison CMD MRI thoracic spine. CLINICAL HISTORY: Compression fracture, lumbar. T \\T \ L spine fx. Abnormal CT's, done yesterday in PACS. FINDINGS: BONES AND ALIGNMENT: Acute compression fracture of L1 with edema along the superior endplate and approximately 20% height loss centrally. There is additional irregularity of the L2 endplates with edema along the L2 inferior endplate and approximately 35% height loss centrally. The degree of height loss appears slightly increased compared to the CT from 05/12/24. Compression deformities of L3 and L4 status post kyphoplasty without significant edema. There is evidence of acute fracture irregularity of the L5 superior endplate with associated edema and approximately 20% height loss. Lumbar lordosis is maintained. Similar anterolisthesis of L5 on S1. SPINAL CORD: The conus medullaris extends to the mid/lower L2 level. SOFT TISSUES: There is mild edema within the spinal musculature of the lower lumbar spine. L1-L2: At T12-L1 there is a small disc bulge  and retropulsion of fracture fragments at the L1 superior endplate which results in slight indentation of the ventral thecal sac, greater on the right. No significant spinal canal or foraminal stenosis. At L1-2 there is a small disc bulge and retropulsion of fracture fragments at the L2 superior endplate without significant spinal canal stenosis. Mild facet arthrosis. No significant foraminal stenosis. L2-L3: At L2-3 there is a small disc bulge and minimal retropulsion at the L3 superior endplate without significant spinal canal stenosis. Bilateral facet arthrosis. No significant foraminal stenosis. L3-L4: At L3-4 there is a small disc bulge. Mild facet  arthrosis. No significant spinal canal or foraminal stenosis. L4-L5: At L4-5 is a small disc bulge and minimal retropulsion at the L5 superior endplate. Mild-to-moderate facet arthrosis. No significant spinal canal stenosis. There is mild foraminal stenosis on the right. L5-S1: At L5-S1 there is anterolisthesis with partial uncovering of the disc. Diffuse disc bulge and dense ventral thecal sac with mild lateral recess narrowing. Moderate-to-severe facet arthrosis. Mild spinal canal stenosis. There is mild right and moderate left foraminal stenosis. Disc bulge contacts the bilateral L5 nerve roots within the foramina. S1-S2: Rudimentary disc at the S1-2 level. There is transitional anatomy with a partially lumbarized S1 vertebra. IMPRESSION: 1. Acute compression fracture of L1 with edema along the superior endplate and approximately 20% height loss centrally. 2. Acute compression fracture of L2 and approximately 35% height loss centrally, slightly increased compared to prior CT. 3. Acute compression fracture of L5 with approximately 20% height loss. 4. Chronic compression deformities of L3 and L4 status post kyphoplasty without evidence of acute fracture. 5. Anterolisthesis of L5 on S1 with partial uncovering of the disc, diffuse disc bulge, and mild spinal canal  stenosis. Mild right and moderate left foraminal stenosis. Disc bulge contacts the bilateral L5 nerve roots. 6. Transitional anatomy with a partially lumbarized S1 vertebra and rudimentary disc at the S1-2 level. Electronically signed by: Donnice Mania MD 05/17/2024 02:31 PM EDT RP Workstation: [id number]   MR THORACIC SPINE WO CONTRAST Result Date: 05/17/2024 EXAM: MR Thoracic Spine without 05/17/2024 12:46:57 PM TECHNIQUE: Multiplanar multisequence MRI of the thoracic spine was performed without the administration of intravenous contrast. COMPARISON: CT thoracic spine 05/12/2024 and same day MRI lumbar spine CLINICAL HISTORY: Compression fracture, thoracic. ? Compression fx //anesthesia FINDINGS: BONES AND ALIGNMENT: Radium insertion fracture of T8 with approximately 25% height loss anteriorly. There is an additional compression fracture of T11 with irregularity of the superior endplate and up to 15% height loss. Compression fracture of T12 with irregularity of the superior endplate and up to 10% height loss centrally. Edema is noted along the T8, T11, and T12 superior endplates. Vertebral body heights are otherwise maintained. Thoracic kyphosis is maintained. There is minimal retropulsion at the superior endplates of T8, T11, and T12. No suspicious osseous lesion. Opacities in the left lung are better evaluated on recent CT chest. SPINAL CORD: There is no high-grade spinal canal stenosis in the thoracic spine. There is a shallow disc bulge at T9-10 which indents the ventrothecal sac without contacting the spinal cord. Additional shallow disc bulge at T11 along with retropulsion which indents the ventral thecal sac. Disc bulge at T11-12 and retropulsion also indents the ventral thecal sac. There is no spinal cord compression. SOFT TISSUES: The paraspinal soft tissues are unremarkable. DEGENERATIVE CHANGES: Disc bulge at T9-10, T11, and T11-12 with associated retropulsion indenting the ventral thecal sac. No  high-grade foraminal stenosis. Vasarthrosis at multiple levels. IMPRESSION: 1. Acute compression fracture of T8 (approximately 25% height loss anteriorly) and acute on chronic compression fractures of T11 (up to 15% height loss), and T12 (up to 10% height loss centrally) with associated edema and minimal retropulsion. 2. Degenerative changes as above. No high-grade spinal canal or foraminal stenosis. Electronically signed by: Donnice Mania MD 05/17/2024 02:03 PM EDT RP Workstation: [id number]   DG CHEST PORT 1 VIEW Result Date: 05/16/2024 EXAM: 1 VIEW XRAY OF THE CHEST 05/16/2024 01:04:00 AM COMPARISON: 05/15/2024 CLINICAL HISTORY: SOB (shortness of breath) [id number]. SOB FINDINGS: LUNGS AND PLEURA: Similar volume loss with left apex opacity. Similar airspace opacity  in the right apex. Chronic interstitial thickening. No pleural effusion. No pneumothorax. HEART AND MEDIASTINUM: Surgical clips in left hilum. No acute abnormality of the cardiac and mediastinal silhouettes. BONES AND SOFT TISSUES: No acute osseous abnormality. IMPRESSION: 1. Similar volume loss with left apex opacity and similar airspace opacity in the right apex. 2. Chronic interstitial thickening. 3. No acute cardiopulmonary process. Electronically signed by: Norman Gatlin MD 05/16/2024 01:12 AM EDT RP Workstation: [id number]   DG CHEST PORT 1 VIEW Result Date: 05/15/2024 CLINICAL DATA:  Shortness of breath. EXAM: PORTABLE CHEST 1 VIEW COMPARISON:  05/14/2024 FINDINGS: Pleuroparenchymal scarring in the left apex and upper lobe is stable with volume loss again noted left hemithorax. Right lung appears hyperexpanded but clear. Interstitial markings are diffusely coarsened with chronic features. Cardiopericardial silhouette is at upper limits of normal for size. No acute bony abnormality. Telemetry leads overlie the chest. IMPRESSION: Chronic pleuroparenchymal scarring in the left apex and upper lobe with volume loss in the left hemithorax. No acute  cardiopulmonary findings. Electronically Signed   By: Camellia Candle M.D.   On: 05/15/2024 08:29   DG CHEST PORT 1 VIEW Result Date: 05/14/2024 CLINICAL DATA:  [id number].  Shortness of breath. EXAM: PORTABLE CHEST 1 VIEW COMPARISON:  Portable chest 04/10/2024, CTA chest 05/12/2024. FINDINGS: 5:11 a.m. There are stable post treatment/postsurgical changes on the left with right upper lobectomy and scarring, volume loss and chronic right apical opacity. The lungs emphysematous and clear of infiltrates. The sulci are sharp. The mediastinum is stable with shift to the left. The cardiac size is normal. No new osseous finding. IMPRESSION: No evidence of acute chest disease. Stable post treatment/postsurgical changes on the left. Emphysema. Electronically Signed   By: Francis Quam M.D.   On: 05/14/2024 06:03    Subjective: Patient was seen and examined at bed side. She reports feeling much better,  She wants to be discharged home.  Discharge Exam: Vitals:   06/12/24 1218 06/12/24 1359  BP: 128/83   Pulse: 91   Resp: 15   Temp: 97.9 F (36.6 C)   SpO2: 97% 97%   Vitals:   06/12/24 0522 06/12/24 0809 06/12/24 1218 06/12/24 1359  BP: 128/75  128/83   Pulse: 69  91   Resp: 18  15   Temp: 97.9 F (36.6 C)  97.9 F (36.6 C)   TempSrc:      SpO2: 100% 97% 97% 97%  Weight:      Height:        General: Pt is alert, awake, not in acute distress Cardiovascular: RRR, S1/S2 +, no rubs, no gallops Respiratory: CTA bilaterally, no wheezing, no rhonchi Abdominal: Soft, NT, ND, bowel sounds + Extremities: no edema, no cyanosis    The results of significant diagnostics from this hospitalization (including imaging, microbiology, ancillary and laboratory) are listed below for reference.     Microbiology: Recent Results (from the past 240 hours)  Resp panel by RT-PCR (RSV, Flu A&B, Covid) Anterior Nasal Swab     Status: None   Collection Time: 06/09/24 10:15 PM   Specimen: Anterior Nasal Swab   Result Value Ref Range Status   SARS Coronavirus 2 by RT PCR NEGATIVE NEGATIVE Final    Comment: (NOTE) SARS-CoV-2 target nucleic acids are NOT DETECTED.  The SARS-CoV-2 RNA is generally detectable in upper respiratory specimens during the acute phase of infection. The lowest concentration of SARS-CoV-2 viral copies this assay can detect is 138 copies/mL. A negative result does not preclude SARS-Cov-2 infection  and should not be used as the sole basis for treatment or other patient management decisions. A negative result may occur with  improper specimen collection/handling, submission of specimen other than nasopharyngeal swab, presence of viral mutation(s) within the areas targeted by this assay, and inadequate number of viral copies(<138 copies/mL). A negative result must be combined with clinical observations, patient history, and epidemiological information. The expected result is Negative.  Fact Sheet for Patients:  BloggerCourse.com  Fact Sheet for Healthcare Providers:  SeriousBroker.it  This test is no t yet approved or cleared by the [place]  FDA and  has been authorized for detection and/or diagnosis of SARS-CoV-2 by FDA under an Emergency Use Authorization (EUA). This EUA will remain  in effect (meaning this test can be used) for the duration of the COVID-19 declaration under Section 564(b)(1) of the Act, 21 U.S.C.section 360bbb-3(b)(1), unless the authorization is terminated  or revoked sooner.       Influenza A by PCR NEGATIVE NEGATIVE Final   Influenza B by PCR NEGATIVE NEGATIVE Final    Comment: (NOTE) The Xpert Xpress SARS-CoV-2/FLU/RSV plus assay is intended as an aid in the diagnosis of influenza from Nasopharyngeal swab specimens and should not be used as a sole basis for treatment. Nasal washings and aspirates are unacceptable for Xpert Xpress SARS-CoV-2/FLU/RSV testing.  Fact Sheet for  Patients: BloggerCourse.com  Fact Sheet for Healthcare Providers: SeriousBroker.it  This test is not yet approved or cleared by the [place]  FDA and has been authorized for detection and/or diagnosis of SARS-CoV-2 by FDA under an Emergency Use Authorization (EUA). This EUA will remain in effect (meaning this test can be used) for the duration of the COVID-19 declaration under Section 564(b)(1) of the Act, 21 U.S.C. section 360bbb-3(b)(1), unless the authorization is terminated or revoked.     Resp Syncytial Virus by PCR NEGATIVE NEGATIVE Final    Comment: (NOTE) Fact Sheet for Patients: BloggerCourse.com  Fact Sheet for Healthcare Providers: SeriousBroker.it  This test is not yet approved or cleared by the [place]  FDA and has been authorized for detection and/or diagnosis of SARS-CoV-2 by FDA under an Emergency Use Authorization (EUA). This EUA will remain in effect (meaning this test can be used) for the duration of the COVID-19 declaration under Section 564(b)(1) of the Act, 21 U.S.C. section 360bbb-3(b)(1), unless the authorization is terminated or revoked.  Performed at [facility], [address], [place], [place] [address]      Labs: BNP (last 3 results) Recent Labs    01/02/24 1300 01/15/24 0448 02/05/24 1907  BNP 124.9* 95.3 94.4   Basic Metabolic Panel: Recent Labs  Lab 06/09/24 2215 06/10/24 0101 06/11/24 0503  NA 141  --  143  K 4.1  --  3.7  CL 102  --  107  CO2 27  --  25  GLUCOSE 115*  --  91  BUN 11  --  15  CREATININE 0.52  --  0.44  CALCIUM  9.9  --  8.5*  MG  --  2.4  --   PHOS  --  2.9  --    Liver Function Tests: Recent Labs  Lab 06/09/24 2215  AST 22  ALT 14  ALKPHOS 93  BILITOT <0.2  PROT 7.3  ALBUMIN 4.3   Recent Labs  Lab 06/09/24 2215  LIPASE 12   No results for input(s): AMMONIA in the  last 168 hours. CBC: Recent Labs  Lab 06/09/24 2215 06/10/24 0101 06/11/24 0503  WBC  12.2* 9.6 9.8  NEUTROABS 10.0*  --   --   HGB 11.9* 11.6* 10.4*  HCT 38.9 36.7 34.1*  MCV 90.0 90.6 90.9  PLT 246 229 225   Cardiac Enzymes: No results for input(s): CKTOTAL, CKMB, CKMBINDEX, TROPONINI in the last 168 hours. BNP: Invalid input(s): POCBNP CBG: No results for input(s): GLUCAP in the last 168 hours. D-Dimer No results for input(s): DDIMER in the last 72 hours. Hgb A1c No results for input(s): HGBA1C in the last 72 hours. Lipid Profile No results for input(s): CHOL, HDL, LDLCALC, TRIG, CHOLHDL, LDLDIRECT in the last 72 hours. Thyroid  function studies Recent Labs    06/10/24 1445  TSH 0.523   Anemia work up No results for input(s): VITAMINB12, FOLATE, FERRITIN, TIBC, IRON , RETICCTPCT in the last 72 hours. Urinalysis    Component Value Date/Time   COLORURINE YELLOW 06/09/2024 2215   APPEARANCEUR CLEAR 06/09/2024 2215   LABSPEC 1.011 06/09/2024 2215   PHURINE 5.0 06/09/2024 2215   GLUCOSEU NEGATIVE 06/09/2024 2215   HGBUR NEGATIVE 06/09/2024 2215   BILIRUBINUR NEGATIVE 06/09/2024 2215   KETONESUR NEGATIVE 06/09/2024 2215   PROTEINUR NEGATIVE 06/09/2024 2215   UROBILINOGEN 0.2 11/06/2009 1559   NITRITE NEGATIVE 06/09/2024 2215   LEUKOCYTESUR NEGATIVE 06/09/2024 2215   Sepsis Labs Recent Labs  Lab 06/09/24 2215 06/10/24 0101 06/11/24 0503  WBC 12.2* 9.6 9.8   Microbiology Recent Results (from the past 240 hours)  Resp panel by RT-PCR (RSV, Flu A&B, Covid) Anterior Nasal Swab     Status: None   Collection Time: 06/09/24 10:15 PM   Specimen: Anterior Nasal Swab  Result Value Ref Range Status   SARS Coronavirus 2 by RT PCR NEGATIVE NEGATIVE Final    Comment: (NOTE) SARS-CoV-2 target nucleic acids are NOT DETECTED.  The SARS-CoV-2 RNA is generally detectable in upper respiratory specimens during the acute phase of  infection. The lowest concentration of SARS-CoV-2 viral copies this assay can detect is 138 copies/mL. A negative result does not preclude SARS-Cov-2 infection and should not be used as the sole basis for treatment or other patient management decisions. A negative result may occur with  improper specimen collection/handling, submission of specimen other than nasopharyngeal swab, presence of viral mutation(s) within the areas targeted by this assay, and inadequate number of viral copies(<138 copies/mL). A negative result must be combined with clinical observations, patient history, and epidemiological information. The expected result is Negative.  Fact Sheet for Patients:  BloggerCourse.com  Fact Sheet for Healthcare Providers:  SeriousBroker.it  This test is no t yet approved or cleared by the [place]  FDA and  has been authorized for detection and/or diagnosis of SARS-CoV-2 by FDA under an Emergency Use Authorization (EUA). This EUA will remain  in effect (meaning this test can be used) for the duration of the COVID-19 declaration under Section 564(b)(1) of the Act, 21 U.S.C.section 360bbb-3(b)(1), unless the authorization is terminated  or revoked sooner.       Influenza A by PCR NEGATIVE NEGATIVE Final   Influenza B by PCR NEGATIVE NEGATIVE Final    Comment: (NOTE) The Xpert Xpress SARS-CoV-2/FLU/RSV plus assay is intended as an aid in the diagnosis of influenza from Nasopharyngeal swab specimens and should not be used as a sole basis for treatment. Nasal washings and aspirates are unacceptable for Xpert Xpress SARS-CoV-2/FLU/RSV testing.  Fact Sheet for Patients: BloggerCourse.com  Fact Sheet for Healthcare Providers: SeriousBroker.it  This test is not yet approved or cleared by the [place]  FDA and has  been authorized for detection and/or diagnosis of SARS-CoV-2  by FDA under an Emergency Use Authorization (EUA). This EUA will remain in effect (meaning this test can be used) for the duration of the COVID-19 declaration under Section 564(b)(1) of the Act, 21 U.S.C. section 360bbb-3(b)(1), unless the authorization is terminated or revoked.     Resp Syncytial Virus by PCR NEGATIVE NEGATIVE Final    Comment: (NOTE) Fact Sheet for Patients: BloggerCourse.com  Fact Sheet for Healthcare Providers: SeriousBroker.it  This test is not yet approved or cleared by the [place]  FDA and has been authorized for detection and/or diagnosis of SARS-CoV-2 by FDA under an Emergency Use Authorization (EUA). This EUA will remain in effect (meaning this test can be used) for the duration of the COVID-19 declaration under Section 564(b)(1) of the Act, 21 U.S.C. section 360bbb-3(b)(1), unless the authorization is terminated or revoked.  Performed at [facility], [address] [place], [place] [address]      Time coordinating discharge: Over 30 minutes  SIGNED:   Darcel Dawley, MD  [facility] 06/12/2024, 4:54 PM Pager   If 7PM-7AM, please contact night-coverage

## 2024-06-12 NOTE — Progress Notes (Signed)
 PROGRESS NOTE    Tracy Park  FMW:[id number] DOB: June 27, 1956 DOA: 06/09/2024 PCP: Corlis Longs, FNP   Brief Narrative:  This 68 y.o. female with medical history significant of COPD, chronic hypoxemic respiratory failure on 2 L of oxygen  via nasal cannula, adenocarcinoma of lung status post left upper lobectomy, paroxysmal A-fib on Eliquis , hypothyroidism, anxiety, PTSD presented with worsening shortness of breath.  Patient was recently admitted for T8 vertebrae compression fracture and discharged to [facility] on 05/21/2024.  She was discharged from SNF yesterday, as soon as she arrived home and she has smoked 2-1/2 cigarettes and subsequently started having respiratory difficulty, chest tightness and wheezing.  EMS was called and she was found to have SpO2 of 70% initially which improved to 80% on 3 L of oxygen .  Workup so far negative in the ED, leukocytosis 12.2 chest x-ray shows no pneumonia but patient continued to have significant wheezing. Patient was admitted for acute COPD exacerbation.  Assessment & Plan:   Active Problems:   COPD exacerbation (HCC)   Acute on chronic hypoxemic respiratory failure: COPD with acute exacerbation: Patient presented with shortness of breath, wheezing, chest tightness.   She was recently discharged from SNF.  As soon as she arrived home she has smoked 2 1/2 cigarettes. Oxygen  dropped to 70s at home.  Initially required 3 L of oxygen  now on 2 L which is her baseline.   She is afebrile.  Has leukocytosis that have resolved. Chest x-ray negative for pneumonia.  COVID,  flu , RSV negative.  Started on IV Solu-Medrol . Will continue IV steroids, azithromycin . Continue scheduled and as needed DuoNebs. She still has significant wheezing but improving.   Thoraco lumbar compression fracture: She was recently discharged on 05/21/2024 to SNF. Continue as needed pain medications. Consult PT/OT   History of lung cancer: Status post left upper lung  lobectomy.   Paroxysmal A-fib: Rate controlled. Continue Eliquis  and metoprolol .   Hypothyroidism:  Continue levothyroxine .   Hyperlipidemia:  Continue statin.   Hypertension:  Blood pressure is stable. Continue metoprolol  and losartan . -Monitor blood pressure closely.   Anxiety/ depression / PTSD: -Continue sertraline .   Tobacco abuse: Recommend cessation.   DVT prophylaxis: Eliquis  Code Status: DNR Family Communication: No Family at bedside Disposition Plan:    Status is: Inpatient Remains inpatient appropriate because: Admitted for COPD exacerbation.   Consultants:  None  Procedures: None  Antimicrobials:  Anti-infectives (From admission, onward)    Start     Dose/Rate Route Frequency Ordered Stop   06/10/24 0015  azithromycin  (ZITHROMAX ) 500 mg in sodium chloride  0.9 % 250 mL IVPB        500 mg 250 mL/hr over 60 Minutes Intravenous Daily at bedtime 06/10/24 0004        Subjective: Patient was seen and examined at bedside. Overnight events noted. Patient reports feeling better,  She still has significant wheezing but improving. She does not feel ready to be discharged today,  states she does not have family at home.  Objective: Vitals:   06/11/24 1923 06/11/24 1931 06/12/24 0522 06/12/24 0809  BP:  107/83 128/75   Pulse:  81 69   Resp:  18 18   Temp:  98.6 F (37 C) 97.9 F (36.6 C)   TempSrc:  Oral    SpO2: 94% 100% 100% 97%  Weight:      Height:       No intake or output data in the 24 hours ending 06/12/24 1215  [employer]  06/09/24 2126 06/10/24 2300  Weight: 53.5 kg 51.6 kg    Examination:  General exam: Appears calm and comfortable, not in any acute distress. Respiratory system: Wheezing Bilaterally. Respiratory effort normal. RR 20 Cardiovascular system: S1 & S2 heard, RRR. No JVD, murmurs, rubs, gallops or clicks.  Gastrointestinal system: Abdomen is non distended, soft and non tender.  Normal bowel sounds heard. Central  nervous system: Alert and oriented X 3. No focal neurological deficits. Extremities: No edema, no cyanosis, no clubbing. Skin: No rashes, lesions or ulcers Psychiatry: Judgement and insight appear normal. Mood & affect appropriate.     Data Reviewed: I have personally reviewed following labs and imaging studies  CBC: Recent Labs  Lab 06/09/24 2215 06/10/24 0101 06/11/24 0503  WBC 12.2* 9.6 9.8  NEUTROABS 10.0*  --   --   HGB 11.9* 11.6* 10.4*  HCT 38.9 36.7 34.1*  MCV 90.0 90.6 90.9  PLT 246 229 225   Basic Metabolic Panel: Recent Labs  Lab 06/09/24 2215 06/10/24 0101 06/11/24 0503  NA 141  --  143  K 4.1  --  3.7  CL 102  --  107  CO2 27  --  25  GLUCOSE 115*  --  91  BUN 11  --  15  CREATININE 0.52  --  0.44  CALCIUM  9.9  --  8.5*  MG  --  2.4  --   PHOS  --  2.9  --    GFR: Estimated Creatinine Clearance: 56.3 mL/min (by C-G formula based on SCr of 0.44 mg/dL). Liver Function Tests: Recent Labs  Lab 06/09/24 2215  AST 22  ALT 14  ALKPHOS 93  BILITOT <0.2  PROT 7.3  ALBUMIN 4.3   Recent Labs  Lab 06/09/24 2215  LIPASE 12   No results for input(s): AMMONIA in the last 168 hours. Coagulation Profile: No results for input(s): INR, PROTIME in the last 168 hours. Cardiac Enzymes: No results for input(s): CKTOTAL, CKMB, CKMBINDEX, TROPONINI in the last 168 hours. BNP (last 3 results) No results for input(s): PROBNP in the last 8760 hours. HbA1C: No results for input(s): HGBA1C in the last 72 hours. CBG: No results for input(s): GLUCAP in the last 168 hours. Lipid Profile: No results for input(s): CHOL, HDL, LDLCALC, TRIG, CHOLHDL, LDLDIRECT in the last 72 hours. Thyroid  Function Tests: Recent Labs    06/10/24 1445  TSH 0.523   Anemia Panel: No results for input(s): VITAMINB12, FOLATE, FERRITIN, TIBC, IRON , RETICCTPCT in the last 72 hours. Sepsis Labs: Recent Labs  Lab 06/09/24 2228 06/10/24 0057   LATICACIDVEN 1.4 0.5    Recent Results (from the past 240 hours)  Resp panel by RT-PCR (RSV, Flu A&B, Covid) Anterior Nasal Swab     Status: None   Collection Time: 06/09/24 10:15 PM   Specimen: Anterior Nasal Swab  Result Value Ref Range Status   SARS Coronavirus 2 by RT PCR NEGATIVE NEGATIVE Final    Comment: (NOTE) SARS-CoV-2 target nucleic acids are NOT DETECTED.  The SARS-CoV-2 RNA is generally detectable in upper respiratory specimens during the acute phase of infection. The lowest concentration of SARS-CoV-2 viral copies this assay can detect is 138 copies/mL. A negative result does not preclude SARS-Cov-2 infection and should not be used as the sole basis for treatment or other patient management decisions. A negative result may occur with  improper specimen collection/handling, submission of specimen other than nasopharyngeal swab, presence of viral mutation(s) within the areas targeted by this assay, and  inadequate number of viral copies(<138 copies/mL). A negative result must be combined with clinical observations, patient history, and epidemiological information. The expected result is Negative.  Fact Sheet for Patients:  BloggerCourse.com  Fact Sheet for Healthcare Providers:  SeriousBroker.it  This test is no t yet approved or cleared by the [place]  FDA and  has been authorized for detection and/or diagnosis of SARS-CoV-2 by FDA under an Emergency Use Authorization (EUA). This EUA will remain  in effect (meaning this test can be used) for the duration of the COVID-19 declaration under Section 564(b)(1) of the Act, 21 U.S.C.section 360bbb-3(b)(1), unless the authorization is terminated  or revoked sooner.       Influenza A by PCR NEGATIVE NEGATIVE Final   Influenza B by PCR NEGATIVE NEGATIVE Final    Comment: (NOTE) The Xpert Xpress SARS-CoV-2/FLU/RSV plus assay is intended as an aid in the diagnosis of  influenza from Nasopharyngeal swab specimens and should not be used as a sole basis for treatment. Nasal washings and aspirates are unacceptable for Xpert Xpress SARS-CoV-2/FLU/RSV testing.  Fact Sheet for Patients: BloggerCourse.com  Fact Sheet for Healthcare Providers: SeriousBroker.it  This test is not yet approved or cleared by the [place]  FDA and has been authorized for detection and/or diagnosis of SARS-CoV-2 by FDA under an Emergency Use Authorization (EUA). This EUA will remain in effect (meaning this test can be used) for the duration of the COVID-19 declaration under Section 564(b)(1) of the Act, 21 U.S.C. section 360bbb-3(b)(1), unless the authorization is terminated or revoked.     Resp Syncytial Virus by PCR NEGATIVE NEGATIVE Final    Comment: (NOTE) Fact Sheet for Patients: BloggerCourse.com  Fact Sheet for Healthcare Providers: SeriousBroker.it  This test is not yet approved or cleared by the [place]  FDA and has been authorized for detection and/or diagnosis of SARS-CoV-2 by FDA under an Emergency Use Authorization (EUA). This EUA will remain in effect (meaning this test can be used) for the duration of the COVID-19 declaration under Section 564(b)(1) of the Act, 21 U.S.C. section 360bbb-3(b)(1), unless the authorization is terminated or revoked.  Performed at [facility], [address], [place], [place] [address]     Radiology Studies: No results found.  Scheduled Meds:  apixaban   5 mg Oral BID   feeding supplement  237 mL Oral TID BM   Influenza vac split trivalent PF  0.5 mL Intramuscular Tomorrow-1000   ipratropium-albuterol   3 mL Nebulization TID   Continuous Infusions:  azithromycin  500 mg (06/11/24 2136)     LOS: 2 days    Time spent: 35 Mins    Darcel Dawley, MD [facility]   If 7PM-7AM, please  contact night-coverage
